# Patient Record
Sex: Male | Born: 1955
Health system: Southern US, Community
[De-identification: ages and names within clinical notes are randomized; demographics above are authoritative.]

## PROBLEM LIST (undated history)

## (undated) DIAGNOSIS — G629 Polyneuropathy, unspecified: Secondary | ICD-10-CM

## (undated) DIAGNOSIS — K219 Gastro-esophageal reflux disease without esophagitis: Secondary | ICD-10-CM

## (undated) DIAGNOSIS — I10 Essential (primary) hypertension: Secondary | ICD-10-CM

## (undated) DIAGNOSIS — I2510 Atherosclerotic heart disease of native coronary artery without angina pectoris: Secondary | ICD-10-CM

## (undated) DIAGNOSIS — I219 Acute myocardial infarction, unspecified: Secondary | ICD-10-CM

## (undated) DIAGNOSIS — E119 Type 2 diabetes mellitus without complications: Secondary | ICD-10-CM

## (undated) DIAGNOSIS — M1990 Unspecified osteoarthritis, unspecified site: Secondary | ICD-10-CM

## (undated) HISTORY — PX: VASECTOMY: SHX75

## (undated) HISTORY — PX: CATARACT EXTRACTION: SUR2

## (undated) HISTORY — PX: COLONOSCOPY W/ POLYPECTOMY: SHX1380

---

## 2015-11-23 DIAGNOSIS — E119 Type 2 diabetes mellitus without complications: Secondary | ICD-10-CM | POA: Diagnosis not present

## 2015-11-23 DIAGNOSIS — Z6826 Body mass index (BMI) 26.0-26.9, adult: Secondary | ICD-10-CM | POA: Diagnosis not present

## 2015-11-23 DIAGNOSIS — K219 Gastro-esophageal reflux disease without esophagitis: Secondary | ICD-10-CM | POA: Diagnosis not present

## 2015-11-23 DIAGNOSIS — E7800 Pure hypercholesterolemia, unspecified: Secondary | ICD-10-CM | POA: Diagnosis not present

## 2015-11-23 DIAGNOSIS — E1140 Type 2 diabetes mellitus with diabetic neuropathy, unspecified: Secondary | ICD-10-CM | POA: Diagnosis not present

## 2015-11-23 DIAGNOSIS — Z0000 Encounter for general adult medical examination without abnormal findings: Secondary | ICD-10-CM | POA: Diagnosis not present

## 2016-03-21 DIAGNOSIS — I10 Essential (primary) hypertension: Secondary | ICD-10-CM | POA: Diagnosis not present

## 2016-07-22 DIAGNOSIS — E1140 Type 2 diabetes mellitus with diabetic neuropathy, unspecified: Secondary | ICD-10-CM | POA: Diagnosis not present

## 2016-07-22 DIAGNOSIS — E119 Type 2 diabetes mellitus without complications: Secondary | ICD-10-CM | POA: Diagnosis not present

## 2016-07-22 DIAGNOSIS — I10 Essential (primary) hypertension: Secondary | ICD-10-CM | POA: Diagnosis not present

## 2016-07-22 DIAGNOSIS — K219 Gastro-esophageal reflux disease without esophagitis: Secondary | ICD-10-CM | POA: Diagnosis not present

## 2016-07-22 DIAGNOSIS — E7800 Pure hypercholesterolemia, unspecified: Secondary | ICD-10-CM | POA: Diagnosis not present

## 2016-10-05 DIAGNOSIS — M9902 Segmental and somatic dysfunction of thoracic region: Secondary | ICD-10-CM | POA: Diagnosis not present

## 2016-10-05 DIAGNOSIS — M9905 Segmental and somatic dysfunction of pelvic region: Secondary | ICD-10-CM | POA: Diagnosis not present

## 2016-10-05 DIAGNOSIS — M9903 Segmental and somatic dysfunction of lumbar region: Secondary | ICD-10-CM | POA: Diagnosis not present

## 2016-10-05 DIAGNOSIS — M545 Low back pain: Secondary | ICD-10-CM | POA: Diagnosis not present

## 2016-12-05 DIAGNOSIS — I10 Essential (primary) hypertension: Secondary | ICD-10-CM | POA: Diagnosis not present

## 2017-03-08 DIAGNOSIS — Z1389 Encounter for screening for other disorder: Secondary | ICD-10-CM | POA: Diagnosis not present

## 2017-03-08 DIAGNOSIS — I10 Essential (primary) hypertension: Secondary | ICD-10-CM | POA: Diagnosis not present

## 2017-03-08 DIAGNOSIS — R5382 Chronic fatigue, unspecified: Secondary | ICD-10-CM | POA: Diagnosis not present

## 2017-03-08 DIAGNOSIS — K219 Gastro-esophageal reflux disease without esophagitis: Secondary | ICD-10-CM | POA: Diagnosis not present

## 2017-03-08 DIAGNOSIS — E119 Type 2 diabetes mellitus without complications: Secondary | ICD-10-CM | POA: Diagnosis not present

## 2017-03-08 DIAGNOSIS — E7800 Pure hypercholesterolemia, unspecified: Secondary | ICD-10-CM | POA: Diagnosis not present

## 2017-03-08 DIAGNOSIS — E1140 Type 2 diabetes mellitus with diabetic neuropathy, unspecified: Secondary | ICD-10-CM | POA: Diagnosis not present

## 2017-04-17 DIAGNOSIS — E7800 Pure hypercholesterolemia, unspecified: Secondary | ICD-10-CM | POA: Diagnosis not present

## 2017-04-17 DIAGNOSIS — E1140 Type 2 diabetes mellitus with diabetic neuropathy, unspecified: Secondary | ICD-10-CM | POA: Diagnosis not present

## 2017-04-17 DIAGNOSIS — K219 Gastro-esophageal reflux disease without esophagitis: Secondary | ICD-10-CM | POA: Diagnosis not present

## 2017-04-17 DIAGNOSIS — E119 Type 2 diabetes mellitus without complications: Secondary | ICD-10-CM | POA: Diagnosis not present

## 2017-06-27 DIAGNOSIS — E7800 Pure hypercholesterolemia, unspecified: Secondary | ICD-10-CM | POA: Diagnosis not present

## 2017-06-27 DIAGNOSIS — K219 Gastro-esophageal reflux disease without esophagitis: Secondary | ICD-10-CM | POA: Diagnosis not present

## 2017-06-27 DIAGNOSIS — E119 Type 2 diabetes mellitus without complications: Secondary | ICD-10-CM | POA: Diagnosis not present

## 2017-06-27 DIAGNOSIS — E1140 Type 2 diabetes mellitus with diabetic neuropathy, unspecified: Secondary | ICD-10-CM | POA: Diagnosis not present

## 2017-06-27 DIAGNOSIS — I10 Essential (primary) hypertension: Secondary | ICD-10-CM | POA: Diagnosis not present

## 2017-07-31 DIAGNOSIS — E103293 Type 1 diabetes mellitus with mild nonproliferative diabetic retinopathy without macular edema, bilateral: Secondary | ICD-10-CM | POA: Diagnosis not present

## 2017-10-18 DIAGNOSIS — E7800 Pure hypercholesterolemia, unspecified: Secondary | ICD-10-CM | POA: Diagnosis not present

## 2017-10-18 DIAGNOSIS — E1140 Type 2 diabetes mellitus with diabetic neuropathy, unspecified: Secondary | ICD-10-CM | POA: Diagnosis not present

## 2017-10-18 DIAGNOSIS — S80869A Insect bite (nonvenomous), unspecified lower leg, initial encounter: Secondary | ICD-10-CM | POA: Diagnosis not present

## 2017-10-18 DIAGNOSIS — K219 Gastro-esophageal reflux disease without esophagitis: Secondary | ICD-10-CM | POA: Diagnosis not present

## 2017-10-20 DIAGNOSIS — K219 Gastro-esophageal reflux disease without esophagitis: Secondary | ICD-10-CM | POA: Diagnosis not present

## 2017-10-20 DIAGNOSIS — E7800 Pure hypercholesterolemia, unspecified: Secondary | ICD-10-CM | POA: Diagnosis not present

## 2017-10-20 DIAGNOSIS — A770 Spotted fever due to Rickettsia rickettsii: Secondary | ICD-10-CM | POA: Diagnosis not present

## 2017-10-20 DIAGNOSIS — E1140 Type 2 diabetes mellitus with diabetic neuropathy, unspecified: Secondary | ICD-10-CM | POA: Diagnosis not present

## 2017-10-30 ENCOUNTER — Encounter: Payer: Self-pay | Admitting: Cardiology

## 2017-10-30 DIAGNOSIS — K219 Gastro-esophageal reflux disease without esophagitis: Secondary | ICD-10-CM | POA: Diagnosis not present

## 2017-10-30 DIAGNOSIS — R9431 Abnormal electrocardiogram [ECG] [EKG]: Secondary | ICD-10-CM | POA: Diagnosis not present

## 2017-10-30 DIAGNOSIS — E7800 Pure hypercholesterolemia, unspecified: Secondary | ICD-10-CM | POA: Diagnosis not present

## 2017-10-30 DIAGNOSIS — E1140 Type 2 diabetes mellitus with diabetic neuropathy, unspecified: Secondary | ICD-10-CM | POA: Diagnosis not present

## 2017-10-30 DIAGNOSIS — E119 Type 2 diabetes mellitus without complications: Secondary | ICD-10-CM | POA: Diagnosis not present

## 2017-10-30 DIAGNOSIS — I10 Essential (primary) hypertension: Secondary | ICD-10-CM | POA: Diagnosis not present

## 2017-11-13 DIAGNOSIS — R9431 Abnormal electrocardiogram [ECG] [EKG]: Secondary | ICD-10-CM | POA: Diagnosis not present

## 2017-11-13 DIAGNOSIS — E1140 Type 2 diabetes mellitus with diabetic neuropathy, unspecified: Secondary | ICD-10-CM | POA: Diagnosis not present

## 2017-11-13 DIAGNOSIS — K219 Gastro-esophageal reflux disease without esophagitis: Secondary | ICD-10-CM | POA: Diagnosis not present

## 2017-11-13 DIAGNOSIS — E7800 Pure hypercholesterolemia, unspecified: Secondary | ICD-10-CM | POA: Diagnosis not present

## 2017-12-04 DIAGNOSIS — E7800 Pure hypercholesterolemia, unspecified: Secondary | ICD-10-CM | POA: Insufficient documentation

## 2017-12-04 DIAGNOSIS — R9431 Abnormal electrocardiogram [ECG] [EKG]: Secondary | ICD-10-CM

## 2017-12-04 DIAGNOSIS — K219 Gastro-esophageal reflux disease without esophagitis: Secondary | ICD-10-CM | POA: Insufficient documentation

## 2017-12-04 DIAGNOSIS — E1140 Type 2 diabetes mellitus with diabetic neuropathy, unspecified: Secondary | ICD-10-CM

## 2017-12-04 DIAGNOSIS — I10 Essential (primary) hypertension: Secondary | ICD-10-CM

## 2017-12-11 ENCOUNTER — Ambulatory Visit: Payer: BLUE CROSS/BLUE SHIELD | Admitting: Cardiology

## 2017-12-11 ENCOUNTER — Encounter: Payer: Self-pay | Admitting: Cardiology

## 2017-12-11 VITALS — BP 130/82 | HR 95 | Ht 74.0 in | Wt 188.0 lb

## 2017-12-11 DIAGNOSIS — R9431 Abnormal electrocardiogram [ECG] [EKG]: Secondary | ICD-10-CM

## 2017-12-11 DIAGNOSIS — E1140 Type 2 diabetes mellitus with diabetic neuropathy, unspecified: Secondary | ICD-10-CM

## 2017-12-11 DIAGNOSIS — I10 Essential (primary) hypertension: Secondary | ICD-10-CM

## 2017-12-11 DIAGNOSIS — I2510 Atherosclerotic heart disease of native coronary artery without angina pectoris: Secondary | ICD-10-CM | POA: Diagnosis not present

## 2017-12-11 MED ORDER — METOPROLOL TARTRATE 25 MG PO TABS: 25.0000 mg | ORAL_TABLET | Freq: Two times a day (BID) | ORAL | 2 refills | Status: DC

## 2017-12-11 MED ORDER — ATORVASTATIN CALCIUM 40 MG PO TABS: 40.0000 mg | ORAL_TABLET | Freq: Every day | ORAL | 2 refills | Status: DC

## 2017-12-11 NOTE — H&P (View-Only) (Signed)
He had a stress echo today which was grossly abnormal I went to the room to talk to him about this. I told him in his situation with some fixed defect involving inferior wall and grossly abnormal stress test cardiac catheterization should be considered. I discussed options with him that included simply medical therapy versus cardiac catheterization he decided to proceed with cardiac catheterization procedure was explained to him including all risk benefits as well as alternative. In the meantime I will give him ranolazine 500 twice daily plus additional nitroglycerin as needed. He is already on beta-blocker and aspirin which I will continue. Abnormal stress test: Disposition proceed with cardiac catheterization of the beginning of next week.?      Cardiology Consultation:    Date:  12/11/2017  Abnormal EKG ID:  [name], DOB 09/02/1955, MRN [id number]  PCP:  Lee, Keung, MD  Cardiologist:  Robert Krasowski, MD   Referring MD: Lee, Keung, MD   No chief complaint on file. Abnormal EKG  History of Present Illness:    [name] is a 62 y.o. male who is being seen today for the evaluation of abnormal EKG at the request of Lee, Keung, MD.  Recently he seen his primary care physician routinely yearly EKG was repeated and showed evidence of inferior wall myocardial infarction.  He denies having any symptoms there is no chest pain tightness squeezing pressure burning chest.  He does have multiple risk factor for coronary artery disease which include long-standing diabetes, hypertension.  He never smoked he does not have family history of premature coronary artery disease.  He is to be very athletic but now he cannot run mostly because of pain in his legs.  He thinks he does neuropathy.  He is somewhat surprised with this diagnosis and obviously very concerned.  History reviewed. No pertinent past medical history.  Past Surgical History:  Procedure Laterality Date  . CATARACT EXTRACTION    .  VASECTOMY      Current Medications: Current Meds  Medication Sig  . aspirin EC 81 MG tablet Take 81 mg by mouth daily.  . insulin detemir (LEVEMIR) 100 UNIT/ML injection Inject 25 Units into the skin at bedtime.  . metFORMIN (GLUCOPHAGE) 500 MG tablet Take 500 mg by mouth 2 (two) times daily with a meal.  . sitaGLIPtin (JANUVIA) 100 MG tablet Take 100 mg by mouth daily.     Allergies:   Codeine sulfate [codeine]   Social History   Socioeconomic History  . Marital status: Married    Spouse name: Not on file  . Number of children: Not on file  . Years of education: Not on file  . Highest education level: Not on file  Occupational History  . Not on file  Social Needs  . Financial resource strain: Not on file  . Food insecurity:    Worry: Not on file    Inability: Not on file  . Transportation needs:    Medical: Not on file    Non-medical: Not on file  Tobacco Use  . Smoking status: Never Smoker  . Smokeless tobacco: Never Used  Substance and Sexual Activity  . Alcohol use: Yes    Comment: occasional  . Drug use: Not on file  . Sexual activity: Not on file  Lifestyle  . Physical activity:    Days per week: Not on file    Minutes per session: Not on file  . Stress: Not on file  Relationships  . Social connections:      Talks on phone: Not on file    Gets together: Not on file    Attends religious service: Not on file    Active member of club or organization: Not on file    Attends meetings of clubs or organizations: Not on file    Relationship status: Not on file  Other Topics Concern  . Not on file  Social History Narrative  . Not on file     Family History: The patient's family history includes Diabetes in his daughter, father, and mother; Heart disease in his father. ROS:   Please see the history of present illness.    All 14 point review of systems negative except as described per history of present illness.  EKGs/Labs/Other Studies Reviewed:    The  following studies were reviewed today: Normal sinus rhythm normal P interval Q waves in inferior leads  EKG:  EKG is  ordered today.  The ekg ordered today demonstrates normal sinus rhythm normal PR interval Q waves in the inferior leads  Recent Labs: No results found for requested labs within last 8760 hours.  Recent Lipid Panel No results found for: CHOL, TRIG, HDL, CHOLHDL, VLDL, LDLCALC, LDLDIRECT  Physical Exam:    VS:  BP 130/82 (BP Location: Right Arm, Patient Position: Sitting, Cuff Size: Normal)   Pulse 95   Ht 6' 2" (1.88 m)   Wt 188 lb (85.3 kg)   SpO2 98%   BMI 24.14 kg/m     Wt Readings from Last 3 Encounters:  12/11/17 188 lb (85.3 kg)     GEN:  Well nourished, well developed in no acute distress HEENT: Normal NECK: No JVD; No carotid bruits LYMPHATICS: No lymphadenopathy CARDIAC: RRR, no murmurs, no rubs, no gallops RESPIRATORY:  Clear to auscultation without rales, wheezing or rhonchi  ABDOMEN: Soft, non-tender, non-distended MUSCULOSKELETAL:  No edema; No deformity  SKIN: Warm and dry NEUROLOGIC:  Alert and oriented x 3 PSYCHIATRIC:  Normal affect   ASSESSMENT:    1. Abnormal EKG   2. Coronary artery disease involving native coronary artery of native heart without angina pectoris   3. Essential hypertension   4. Controlled type 2 diabetes mellitus with neuropathy (HCC)    PLAN:    In order of problems listed above:  1. Abnormal EKG raising suspicion for inferior wall myocardialinfarction.  Obviously clarification of this problem need to happen.  I will schedule him to have echocardiogram to assess left ventricular ejection fraction with special attention being paid to his inferior wall.  On top of that he will have stress test done.  The purpose of it is to establish if he has still residual significant coronary artery disease.  In the meantime he is already on aspirin which I will continue he will be started on high intensity statin I will start him  today on metoprolol 25 twice daily.  In the future we will add ACE inhibitor or ARB.  Apparently he is on some research study for protein in his kidneys I suspect he is on either ACE inhibitor or ARB already need to find out more about exactly what kind of trial he is in. 2. Essential hypertension: Blood pressure well controlled continue present medications. 3. Type 2 diabetes: Followed by internal medicine team.  Apparently his hemoglobin A1c is still elevated in the neighborhood of 8.  The key will be to control his diabetes best possible.  Gentleman who was referred to us for EKG indicating possibility of inferior wall myocardial infarction.    Obviously that need to be clarified echocardiogram will be done as well as stress test.  Plus risk factors will be modified for his coronary artery disease.  We did talk in length about exercises on the regular basis and diet he understand this and he will try to comply with that.   Medication Adjustments/Labs and Tests Ordered: Current medicines are reviewed at length with the patient today.  Concerns regarding medicines are outlined above.  No orders of the defined types were placed in this encounter.  No orders of the defined types were placed in this encounter.   Signed, Robert J. Krasowski, MD, FACC. 12/11/2017 4:13 PM    [facility] 

## 2017-12-11 NOTE — Progress Notes (Addendum)
He had a stress echo today which was grossly abnormal I went to the room to talk to him about this. I told him in his situation with some fixed defect involving inferior wall and grossly abnormal stress test cardiac catheterization should be considered. I discussed options with him that included simply medical therapy versus cardiac catheterization he decided to proceed with cardiac catheterization procedure was explained to him including all risk benefits as well as alternative. In the meantime I will give him ranolazine 500 twice daily plus additional nitroglycerin as needed. He is already on beta-blocker and aspirin which I will continue. Abnormal stress test: Disposition proceed with cardiac catheterization of the beginning of next week.?      Cardiology Consultation:    Date:  12/11/2017  Abnormal EKG ID:  [name] Seiler, DOB 05/15/1956, MRN [id number]  PCP:  Simone CuriaLee, Keung, MD  Cardiologist:  Gypsy Balsamobert Idaliz Tinkle, MD   Referring MD: Simone CuriaLee, Keung, MD   No chief complaint on file. Abnormal EKG  History of Present Illness:    [name] Obriant is a 62 y.o. male who is being seen today for the evaluation of abnormal EKG at the request of Simone CuriaLee, Keung, MD.  Recently he seen his primary care physician routinely yearly EKG was repeated and showed evidence of inferior wall myocardial infarction.  He denies having any symptoms there is no chest pain tightness squeezing pressure burning chest.  He does have multiple risk factor for coronary artery disease which include long-standing diabetes, hypertension.  He never smoked he does not have family history of premature coronary artery disease.  He is to be very athletic but now he cannot run mostly because of pain in his legs.  He thinks he does neuropathy.  He is somewhat surprised with this diagnosis and obviously very concerned.  History reviewed. No pertinent past medical history.  Past Surgical History:  Procedure Laterality Date  . CATARACT EXTRACTION    .  VASECTOMY      Current Medications: Current Meds  Medication Sig  . aspirin EC 81 MG tablet Take 81 mg by mouth daily.  . insulin detemir (LEVEMIR) 100 UNIT/ML injection Inject 25 Units into the skin at bedtime.  . metFORMIN (GLUCOPHAGE) 500 MG tablet Take 500 mg by mouth 2 (two) times daily with a meal.  . sitaGLIPtin (JANUVIA) 100 MG tablet Take 100 mg by mouth daily.     Allergies:   Codeine sulfate [codeine]   Social History   Socioeconomic History  . Marital status: Married    Spouse name: Not on file  . Number of children: Not on file  . Years of education: Not on file  . Highest education level: Not on file  Occupational History  . Not on file  Social Needs  . Financial resource strain: Not on file  . Food insecurity:    Worry: Not on file    Inability: Not on file  . Transportation needs:    Medical: Not on file    Non-medical: Not on file  Tobacco Use  . Smoking status: Never Smoker  . Smokeless tobacco: Never Used  Substance and Sexual Activity  . Alcohol use: Yes    Comment: occasional  . Drug use: Not on file  . Sexual activity: Not on file  Lifestyle  . Physical activity:    Days per week: Not on file    Minutes per session: Not on file  . Stress: Not on file  Relationships  . Social connections:  Talks on phone: Not on file    Gets together: Not on file    Attends religious service: Not on file    Active member of club or organization: Not on file    Attends meetings of clubs or organizations: Not on file    Relationship status: Not on file  Other Topics Concern  . Not on file  Social History Narrative  . Not on file     Family History: The patient's family history includes Diabetes in his daughter, father, and mother; Heart disease in his father. ROS:   Please see the history of present illness.    All 14 point review of systems negative except as described per history of present illness.  EKGs/Labs/Other Studies Reviewed:    The  following studies were reviewed today: Normal sinus rhythm normal P interval Q waves in inferior leads  EKG:  EKG is  ordered today.  The ekg ordered today demonstrates normal sinus rhythm normal PR interval Q waves in the inferior leads  Recent Labs: No results found for requested labs within last 8760 hours.  Recent Lipid Panel No results found for: CHOL, TRIG, HDL, CHOLHDL, VLDL, LDLCALC, LDLDIRECT  Physical Exam:    VS:  BP 130/82 (BP Location: Right Arm, Patient Position: Sitting, Cuff Size: Normal)   Pulse 95   Ht 6\' 2"  (1.88 m)   Wt 188 lb (85.3 kg)   SpO2 98%   BMI 24.14 kg/m     Wt Readings from Last 3 Encounters:  12/11/17 188 lb (85.3 kg)     GEN:  Well nourished, well developed in no acute distress HEENT: Normal NECK: No JVD; No carotid bruits LYMPHATICS: No lymphadenopathy CARDIAC: RRR, no murmurs, no rubs, no gallops RESPIRATORY:  Clear to auscultation without rales, wheezing or rhonchi  ABDOMEN: Soft, non-tender, non-distended MUSCULOSKELETAL:  No edema; No deformity  SKIN: Warm and dry NEUROLOGIC:  Alert and oriented x 3 PSYCHIATRIC:  Normal affect   ASSESSMENT:    1. Abnormal EKG   2. Coronary artery disease involving native coronary artery of native heart without angina pectoris   3. Essential hypertension   4. Controlled type 2 diabetes mellitus with neuropathy (HCC)    PLAN:    In order of problems listed above:  1. Abnormal EKG raising suspicion for inferior wall myocardialinfarction.  Obviously clarification of this problem need to happen.  I will schedule him to have echocardiogram to assess left ventricular ejection fraction with special attention being paid to his inferior wall.  On top of that he will have stress test done.  The purpose of it is to establish if he has still residual significant coronary artery disease.  In the meantime he is already on aspirin which I will continue he will be started on high intensity statin I will start him  today on metoprolol 25 twice daily.  In the future we will add ACE inhibitor or ARB.  Apparently he is on some research study for protein in his kidneys I suspect he is on either ACE inhibitor or ARB already need to find out more about exactly what kind of trial he is in. 2. Essential hypertension: Blood pressure well controlled continue present medications. 3. Type 2 diabetes: Followed by internal medicine team.  Apparently his hemoglobin A1c is still elevated in the neighborhood of 8.  The key will be to control his diabetes best possible.  Gentleman who was referred to [place] for EKG indicating possibility of inferior wall myocardial infarction.  Obviously that need to be clarified echocardiogram will be done as well as stress test.  Plus risk factors will be modified for his coronary artery disease.  We did talk in length about exercises on the regular basis and diet he understand this and he will try to comply with that.   Medication Adjustments/Labs and Tests Ordered: Current medicines are reviewed at length with the patient today.  Concerns regarding medicines are outlined above.  No orders of the defined types were placed in this encounter.  No orders of the defined types were placed in this encounter.   Signed, Georgeanna Lea, MD, [facility]. 12/11/2017 4:13 PM    [facility]

## 2017-12-11 NOTE — Patient Instructions (Signed)
Medication Instructions:  Your physician has recommended you make the following change in your medication:  START metoprolol 25 mg twice daily START lipitor 40 mg daily  Labwork: None  Testing/Procedures: Your physician has requested that you have an echocardiogram. Echocardiography is a painless test that uses sound waves to create images of your heart. It provides your doctor with information about the size and shape of your heart and how well your heart's chambers and valves are working. This procedure takes approximately one hour. There are no restrictions for this procedure.  Your physician has requested that you have a stress echocardiogram. For further information please visit [email or web address]. Please follow instruction sheet as given.  Follow-Up: Your physician recommends that you schedule a follow-up appointment in: 1 month  Any Other Special Instructions Will Be Listed Below (If Applicable).     If you need a refill on your cardiac medications before your next appointment, please call your pharmacy.   [facility]  Garey HamAshley A, RN, BSN

## 2017-12-15 ENCOUNTER — Ambulatory Visit (INDEPENDENT_AMBULATORY_CARE_PROVIDER_SITE_OTHER): Payer: BLUE CROSS/BLUE SHIELD

## 2017-12-15 ENCOUNTER — Other Ambulatory Visit: Payer: Self-pay

## 2017-12-15 DIAGNOSIS — I2510 Atherosclerotic heart disease of native coronary artery without angina pectoris: Secondary | ICD-10-CM

## 2017-12-15 DIAGNOSIS — I10 Essential (primary) hypertension: Secondary | ICD-10-CM | POA: Diagnosis not present

## 2017-12-15 NOTE — Progress Notes (Addendum)
Stress test with echocardiogram has been performed.  Jimmy Shaney Deckman RDCS

## 2017-12-15 NOTE — Progress Notes (Unsigned)
He had a stress echo today which was grossly abnormal I went to the room to talk to him about this.  I told him in his situation with some fixed defect involving inferior wall and grossly abnormal stress test cardiac catheterization should be considered.  I discussed options with him that included simply medical therapy versus cardiac catheterization he decided to proceed with cardiac catheterization procedure was explained to him including all risk benefits as well as alternative.  In the meantime I will give him ranolazine 500 twice daily plus additional nitroglycerin as needed.  He is already on beta-blocker and aspirin which I will continue. Abnormal stress test: Disposition proceed with cardiac catheterization of the beginning of next week.

## 2017-12-16 LAB — CBC WITH DIFFERENTIAL/PLATELET
BASOS: 1 %
Basophils Absolute: 0 10*3/uL (ref 0.0–0.2)
EOS (ABSOLUTE): 0.4 10*3/uL (ref 0.0–0.4)
Eos: 5 %
Hematocrit: 42.3 % (ref 37.5–51.0)
Hemoglobin: 13.8 g/dL (ref 13.0–17.7)
Immature Grans (Abs): 0 10*3/uL (ref 0.0–0.1)
Immature Granulocytes: 0 %
Lymphocytes Absolute: 2 10*3/uL (ref 0.7–3.1)
Lymphs: 25 %
MCH: 29.6 pg (ref 26.6–33.0)
MCHC: 32.6 g/dL (ref 31.5–35.7)
MCV: 91 fL (ref 79–97)
MONOS ABS: 0.5 10*3/uL (ref 0.1–0.9)
Monocytes: 6 %
NEUTROS ABS: 5.2 10*3/uL (ref 1.4–7.0)
NEUTROS PCT: 63 %
Platelets: 248 10*3/uL (ref 150–450)
RBC: 4.67 x10E6/uL (ref 4.14–5.80)
RDW: 12.9 % (ref 12.3–15.4)
WBC: 8.1 10*3/uL (ref 3.4–10.8)

## 2017-12-16 LAB — BASIC METABOLIC PANEL
BUN / CREAT RATIO: 22 (ref 10–24)
BUN: 22 mg/dL (ref 8–27)
CHLORIDE: 102 mmol/L (ref 96–106)
CO2: 24 mmol/L (ref 20–29)
Calcium: 9.5 mg/dL (ref 8.6–10.2)
Creatinine, Ser: 1 mg/dL (ref 0.76–1.27)
GFR calc non Af Amer: 81 mL/min/{1.73_m2} (ref >59)
GFR, EST AFRICAN AMERICAN: 94 mL/min/{1.73_m2} (ref >59)
Glucose: 191 mg/dL — ABNORMAL HIGH (ref 65–99)
POTASSIUM: 4.6 mmol/L (ref 3.5–5.2)
Sodium: 141 mmol/L (ref 134–144)

## 2017-12-18 ENCOUNTER — Telehealth: Payer: Self-pay | Admitting: Cardiology

## 2017-12-18 NOTE — Telephone Encounter (Signed)
Clarified for the patient the diet allowed before the heart cath. Patient did not have any further questions or concerns.

## 2017-12-18 NOTE — Telephone Encounter (Signed)
New Message   Pt states he is having a Cath done tomorrow and he just wants to know more information about it. Please call

## 2017-12-19 ENCOUNTER — Encounter (INDEPENDENT_AMBULATORY_CARE_PROVIDER_SITE_OTHER): Payer: Self-pay

## 2017-12-19 ENCOUNTER — Encounter (HOSPITAL_COMMUNITY)
Admission: RE | Disposition: A | Discharge status: Home / Self Care | Payer: Self-pay | Source: Ambulatory Visit | Attending: Interventional Cardiology

## 2017-12-19 ENCOUNTER — Other Ambulatory Visit: Payer: Self-pay

## 2017-12-19 ENCOUNTER — Ambulatory Visit (HOSPITAL_COMMUNITY)
Admission: RE | Admit: 2017-12-19 | Discharge: 2017-12-19 | Disposition: A | Discharge status: Home / Self Care | Payer: BLUE CROSS/BLUE SHIELD | Source: Ambulatory Visit | Attending: Interventional Cardiology | Admitting: Interventional Cardiology

## 2017-12-19 DIAGNOSIS — I110 Hypertensive heart disease with heart failure: Secondary | ICD-10-CM | POA: Diagnosis not present

## 2017-12-19 DIAGNOSIS — I5042 Chronic combined systolic (congestive) and diastolic (congestive) heart failure: Secondary | ICD-10-CM | POA: Diagnosis not present

## 2017-12-19 DIAGNOSIS — I2582 Chronic total occlusion of coronary artery: Secondary | ICD-10-CM | POA: Diagnosis not present

## 2017-12-19 DIAGNOSIS — R9431 Abnormal electrocardiogram [ECG] [EKG]: Secondary | ICD-10-CM | POA: Diagnosis present

## 2017-12-19 DIAGNOSIS — E1140 Type 2 diabetes mellitus with diabetic neuropathy, unspecified: Secondary | ICD-10-CM | POA: Diagnosis present

## 2017-12-19 DIAGNOSIS — I2584 Coronary atherosclerosis due to calcified coronary lesion: Secondary | ICD-10-CM | POA: Diagnosis not present

## 2017-12-19 DIAGNOSIS — Z794 Long term (current) use of insulin: Secondary | ICD-10-CM | POA: Insufficient documentation

## 2017-12-19 DIAGNOSIS — I2510 Atherosclerotic heart disease of native coronary artery without angina pectoris: Secondary | ICD-10-CM | POA: Diagnosis not present

## 2017-12-19 DIAGNOSIS — Z885 Allergy status to narcotic agent status: Secondary | ICD-10-CM | POA: Diagnosis not present

## 2017-12-19 DIAGNOSIS — I10 Essential (primary) hypertension: Secondary | ICD-10-CM | POA: Diagnosis present

## 2017-12-19 DIAGNOSIS — Z7982 Long term (current) use of aspirin: Secondary | ICD-10-CM | POA: Insufficient documentation

## 2017-12-19 HISTORY — PX: LEFT HEART CATH AND CORONARY ANGIOGRAPHY: CATH118249

## 2017-12-19 HISTORY — PX: INTRAVASCULAR PRESSURE WIRE/FFR STUDY: CATH118243

## 2017-12-19 LAB — GLUCOSE, CAPILLARY
GLUCOSE-CAPILLARY: 110 mg/dL — AB (ref 70–99)
GLUCOSE-CAPILLARY: 84 mg/dL (ref 70–99)

## 2017-12-19 LAB — POCT ACTIVATED CLOTTING TIME: ACTIVATED CLOTTING TIME: 301 s

## 2017-12-19 SURGERY — LEFT HEART CATH AND CORONARY ANGIOGRAPHY
Anesthesia: LOCAL

## 2017-12-19 MED ORDER — MIDAZOLAM HCL 2 MG/2ML IJ SOLN
INTRAMUSCULAR | Status: AC
  Filled 2017-12-19: qty 2

## 2017-12-19 MED ORDER — ADENOSINE 12 MG/4ML IV SOLN
INTRAVENOUS | Status: AC
  Filled 2017-12-19: qty 16

## 2017-12-19 MED ORDER — HEPARIN (PORCINE) IN NACL 1000-0.9 UT/500ML-% IV SOLN
INTRAVENOUS | Status: DC | PRN
  Administered 2017-12-19: 1000 mL

## 2017-12-19 MED ORDER — HEPARIN SODIUM (PORCINE) 1000 UNIT/ML IJ SOLN
INTRAMUSCULAR | Status: AC
  Filled 2017-12-19: qty 1

## 2017-12-19 MED ORDER — IOHEXOL 350 MG/ML SOLN
INTRAVENOUS | Status: DC | PRN
  Administered 2017-12-19: 125 mL via INTRA_ARTERIAL

## 2017-12-19 MED ORDER — SODIUM CHLORIDE 0.9% FLUSH: 3.0000 mL | INTRAVENOUS | Status: DC | PRN

## 2017-12-19 MED ORDER — SODIUM CHLORIDE 0.9 % WEIGHT BASED INFUSION
3.0000 mL/kg/h | INTRAVENOUS | Status: AC
  Administered 2017-12-19: 3 mL/kg/h via INTRAVENOUS

## 2017-12-19 MED ORDER — SODIUM CHLORIDE 0.9% FLUSH: 3.0000 mL | Freq: Two times a day (BID) | INTRAVENOUS | Status: DC

## 2017-12-19 MED ORDER — VERAPAMIL HCL 2.5 MG/ML IV SOLN
INTRAVENOUS | Status: AC
  Filled 2017-12-19: qty 2

## 2017-12-19 MED ORDER — SODIUM CHLORIDE 0.9 % IV SOLN: INTRAVENOUS | Status: DC

## 2017-12-19 MED ORDER — ONDANSETRON HCL 4 MG/2ML IJ SOLN: 4.0000 mg | Freq: Four times a day (QID) | INTRAMUSCULAR | Status: DC | PRN

## 2017-12-19 MED ORDER — HEPARIN SODIUM (PORCINE) 1000 UNIT/ML IJ SOLN
INTRAMUSCULAR | Status: DC | PRN
  Administered 2017-12-19: 4000 [IU] via INTRAVENOUS
  Administered 2017-12-19: 5000 [IU] via INTRAVENOUS

## 2017-12-19 MED ORDER — FENTANYL CITRATE (PF) 100 MCG/2ML IJ SOLN
INTRAMUSCULAR | Status: AC
  Filled 2017-12-19: qty 2

## 2017-12-19 MED ORDER — ACETAMINOPHEN 325 MG PO TABS: 650.0000 mg | ORAL_TABLET | ORAL | Status: DC | PRN

## 2017-12-19 MED ORDER — FENTANYL CITRATE (PF) 100 MCG/2ML IJ SOLN
INTRAMUSCULAR | Status: DC | PRN
  Administered 2017-12-19: 50 ug via INTRAVENOUS

## 2017-12-19 MED ORDER — MIDAZOLAM HCL 2 MG/2ML IJ SOLN
INTRAMUSCULAR | Status: DC | PRN
  Administered 2017-12-19 (×3): 1 mg via INTRAVENOUS

## 2017-12-19 MED ORDER — VERAPAMIL HCL 2.5 MG/ML IV SOLN
INTRAVENOUS | Status: DC | PRN
  Administered 2017-12-19: 10 mL via INTRA_ARTERIAL

## 2017-12-19 MED ORDER — SODIUM CHLORIDE 0.9 % IV SOLN
250.0000 mL | INTRAVENOUS | Status: DC | PRN
  Administered 2017-12-19: 250 mL via INTRAVENOUS

## 2017-12-19 MED ORDER — NITROGLYCERIN 1 MG/10 ML FOR IR/CATH LAB
INTRA_ARTERIAL | Status: DC | PRN
  Administered 2017-12-19: 200 ug via INTRACORONARY

## 2017-12-19 MED ORDER — HEPARIN (PORCINE) IN NACL 1000-0.9 UT/500ML-% IV SOLN
INTRAVENOUS | Status: AC
  Filled 2017-12-19: qty 1000

## 2017-12-19 MED ORDER — LIDOCAINE HCL (PF) 1 % IJ SOLN
INTRAMUSCULAR | Status: DC | PRN
  Administered 2017-12-19: 2 mL

## 2017-12-19 MED ORDER — LIDOCAINE HCL (PF) 1 % IJ SOLN
INTRAMUSCULAR | Status: AC
  Filled 2017-12-19: qty 30

## 2017-12-19 MED ORDER — SODIUM CHLORIDE 0.9 % IV SOLN: 250.0000 mL | INTRAVENOUS | Status: DC | PRN

## 2017-12-19 MED ORDER — SODIUM CHLORIDE 0.9 % WEIGHT BASED INFUSION: 1.0000 mL/kg/h | INTRAVENOUS | Status: DC

## 2017-12-19 MED ORDER — ASPIRIN 81 MG PO CHEW: 81.0000 mg | CHEWABLE_TABLET | ORAL | Status: DC

## 2017-12-19 SURGICAL SUPPLY — 14 items
CATH 5FR JL3.5 JR4 ANG PIG MP (CATHETERS) ×2 IMPLANT
CATH LAUNCHER 5F EBU3.5 (CATHETERS) ×2 IMPLANT
COVER PRB 48X5XTLSCP FOLD TPE (BAG) ×1 IMPLANT
COVER PROBE 5X48 (BAG) ×1
DEVICE RAD COMP TR BAND LRG (VASCULAR PRODUCTS) ×2 IMPLANT
GLIDESHEATH SLEND A-KIT 6F 22G (SHEATH) ×2 IMPLANT
GUIDEWIRE INQWIRE 1.5J.035X260 (WIRE) ×1 IMPLANT
GUIDEWIRE PRESSURE COMET II (WIRE) ×2 IMPLANT
INQWIRE 1.5J .035X260CM (WIRE) ×2
KIT ESSENTIALS PG (KITS) ×2 IMPLANT
KIT HEART LEFT (KITS) ×2 IMPLANT
PACK CARDIAC CATHETERIZATION (CUSTOM PROCEDURE TRAY) ×2 IMPLANT
TRANSDUCER W/STOPCOCK (MISCELLANEOUS) ×2 IMPLANT
TUBING CIL FLEX 10 FLL-RA (TUBING) ×2 IMPLANT

## 2017-12-19 NOTE — Interval H&P Note (Signed)
Cath Lab Visit (complete for each Cath Lab visit)  Clinical Evaluation Leading to the Procedure:   ACS: No.  Non-ACS:    Anginal Classification: CCS III  Anti-ischemic medical therapy: Minimal Therapy (1 class of medications)  Non-Invasive Test Results: Intermediate-risk stress test findings: cardiac mortality 1-3%/year  Prior CABG: No previous CABG      History and Physical Interval Note:  12/19/2017 1:50 PM  Katherina MiresMilton A Bonaventura  has presented today for surgery, with the diagnosis of abnormal stress echo  The various methods of treatment have been discussed with the patient and family. After consideration of risks, benefits and other options for treatment, the patient has consented to  Procedure(s): LEFT HEART CATH AND CORONARY ANGIOGRAPHY (N/A) as a surgical intervention .  The patient's history has been reviewed, patient examined, no change in status, stable for surgery.  I have reviewed the patient's chart and labs.  Questions were answered to the patient's satisfaction.     Lyn RecordsHenry W Smith III

## 2017-12-19 NOTE — Progress Notes (Signed)
**Note Dorsel Flinn-Identified via Obfuscation** Patient discharged home alert and oriented x4. Denies any chest pain, dizziness, or shortness of breath. Radial site level 0 and dressing clean, dry, and intact. Patient escorted to main entrance in wheelchair and daughter present for transport home. Fall/safety precautions implemented.

## 2017-12-19 NOTE — Discharge Instructions (Signed)
Drink plenty of fluids over next 48 hours and keep right wrist elevated at heart level for 24 hours ° °Radial Site Care °Refer to this sheet in the next few weeks. These instructions provide you with information about caring for yourself after your procedure. Your health care provider may also give you more specific instructions. Your treatment has been planned according to current medical practices, but problems sometimes occur. Call your health care provider if you have any problems or questions after your procedure. °What can I expect after the procedure? °After your procedure, it is typical to have the following: °· Bruising at the radial site that usually fades within 1-2 weeks. °· Blood collecting in the tissue (hematoma) that may be painful to the touch. It should usually decrease in size and tenderness within 1-2 weeks. ° °Follow these instructions at home: °· Take medicines only as directed by your health care provider. °· You may shower 24-48 hours after the procedure or as directed by your health care provider. Remove the bandage (dressing) and gently wash the site with plain soap and water. Pat the area dry with a clean towel. Do not rub the site, because this may cause bleeding. °· Do not take baths, swim, or use a hot tub until your health care provider approves. °· Check your insertion site every day for redness, swelling, or drainage. °· Do not apply powder or lotion to the site. °· Do not flex or bend the affected arm for 24 hours or as directed by your health care provider. °· Do not push or pull heavy objects with the affected arm for 24 hours or as directed by your health care provider. °· Do not lift over 10 lb (4.5 kg) for 5 days after your procedure or as directed by your health care provider. °· Ask your health care provider when it is okay to: °? Return to work or school. °? Resume usual physical activities or sports. °? Resume sexual activity. °· Do not drive home if you are discharged the  same day as the procedure. Have someone else drive you. °· You may drive 24 hours after the procedure unless otherwise instructed by your health care provider. °· Do not operate machinery or power tools for 24 hours after the procedure. °· If your procedure was done as an outpatient procedure, which means that you went home the same day as your procedure, a responsible adult should be with you for the first 24 hours after you arrive home. °· Keep all follow-up visits as directed by your health care provider. This is important. °Contact a health care provider if: °· You have a fever. °· You have chills. °· You have increased bleeding from the radial site. Hold pressure on the site. °Get help right away if: °· You have unusual pain at the radial site. °· You have redness, warmth, or swelling at the radial site. °· You have drainage (other than a small amount of blood on the dressing) from the radial site. °· The radial site is bleeding, and the bleeding does not stop after 30 minutes of holding steady pressure on the site. °· Your arm or hand becomes pale, cool, tingly, or numb. °This information is not intended to replace advice given to you by your health care provider. Make sure you discuss any questions you have with your health care provider. °Document Released: 07/02/2010 Document Revised: 11/05/2015 Document Reviewed: 12/16/2013 °Elsevier Interactive Patient Education © 2018 [employer] ° °

## 2017-12-20 ENCOUNTER — Encounter (HOSPITAL_COMMUNITY): Payer: Self-pay | Admitting: Interventional Cardiology

## 2017-12-20 ENCOUNTER — Telehealth: Payer: Self-pay | Admitting: Cardiology

## 2017-12-20 NOTE — Telephone Encounter (Signed)
Please call patient regarding his heart cath results, he just wants to follow up with us. He is going to see a Careers advisersurgeon for results..Marland Kitchen

## 2017-12-21 NOTE — Telephone Encounter (Signed)
Cath was reviewed by Dr. Bing MatterKrasowski today, left voicemail for the patient to call the office.

## 2017-12-22 ENCOUNTER — Encounter: Payer: Self-pay | Admitting: Cardiothoracic Surgery

## 2017-12-22 ENCOUNTER — Other Ambulatory Visit: Payer: Self-pay | Admitting: *Deleted

## 2017-12-22 ENCOUNTER — Institutional Professional Consult (permissible substitution): Payer: BLUE CROSS/BLUE SHIELD | Admitting: Cardiothoracic Surgery

## 2017-12-22 VITALS — BP 140/95 | HR 100 | Resp 20 | Ht 74.0 in | Wt 188.0 lb

## 2017-12-22 DIAGNOSIS — I2510 Atherosclerotic heart disease of native coronary artery without angina pectoris: Secondary | ICD-10-CM | POA: Diagnosis not present

## 2017-12-22 DIAGNOSIS — E1159 Type 2 diabetes mellitus with other circulatory complications: Secondary | ICD-10-CM | POA: Diagnosis not present

## 2017-12-22 DIAGNOSIS — Z794 Long term (current) use of insulin: Secondary | ICD-10-CM | POA: Diagnosis not present

## 2017-12-22 NOTE — Progress Notes (Signed)
Molly Maduroobert, Dr. Donata ClayVan Trigt plans CAB surgery on your patient this month. Please see note.

## 2017-12-22 NOTE — Progress Notes (Signed)
PCP is Simone CuriaLee, Keung, MD Referring Provider is Lyn RecordsSmith, Henry W, MD  Chief Complaint  Patient presents with  . Coronary Artery Disease    Surgical eval, Cardiac Cath 12/19/17, ECHO 12/11/17, Stress Test 12/15/17    HPI: Patient examined, coronary arteriogram images personally reviewed and counseled with patient.  62 year old insulin-dependent non-smoker with positive family history of CAD-CABG presents for discussion of surgical coronary revascularization.  The patient was noted to have EKG changes on a annual physical.  He underwent a stress test which was abnormal.  He then underwent left heart cath earlier this week by Dr. Katrinka BlazingSmith which demonstrated chronic occlusion of the RCA, high-grade stenosis of the circumflex and moderate disease of the LAD.  Ejection fraction approximately 50%.  Because of his diabetes, three-vessel disease, and the occurrence of an asymptomatic MI he was felt to be a candidate for surgical coronary rationalization.  Patient was prescribed metoprolol 25 twice daily and Lipitor 40 mg by Dr. Katrinka BlazingSmith.  He also takes aspirin Januvia and previously took metformin.  Patient has history of varicose veins but no DVT.  No history of TIA.  No history of claudication.  Patient has neuropathy of his legs from diabetes but no retinopathy or  or nephropathy but he states he does have some protein in his urine.  Patient has had previous cataract surgery and vasa ectomy without problems with bleeding or anesthesia.  History reviewed. No pertinent past medical history.  Past Surgical History:  Procedure Laterality Date  . CATARACT EXTRACTION    . INTRAVASCULAR PRESSURE WIRE/FFR STUDY N/A 12/19/2017   Procedure: INTRAVASCULAR PRESSURE WIRE/FFR STUDY;  Surgeon: Lyn RecordsSmith, Henry W, MD;  [facility];  Service: Cardiovascular;  Laterality: N/A;  . LEFT HEART CATH AND CORONARY ANGIOGRAPHY N/A 12/19/2017   Procedure: LEFT HEART CATH AND CORONARY ANGIOGRAPHY;  Surgeon: Lyn RecordsSmith, Henry W, MD;   Location: [facility];  Service: Cardiovascular;  Laterality: N/A;  . VASECTOMY      Family History  Problem Relation Age of Onset  . Diabetes Mother   . Diabetes Father   . Heart disease Father   . Diabetes Daughter     Social History Social History   Tobacco Use  . Smoking status: Never Smoker  . Smokeless tobacco: Never Used  Substance Use Topics  . Alcohol use: Yes    Comment: occasional  . Drug use: Not on file    Current Outpatient Medications  Medication Sig Dispense Refill  . aspirin EC 81 MG tablet Take 81 mg by mouth daily.    . insulin detemir (LEVEMIR) 100 UNIT/ML injection Inject 25 Units into the skin at bedtime.    . metFORMIN (GLUCOPHAGE) 500 MG tablet Take 500 mg by mouth 2 (two) times daily with a meal.    . sitaGLIPtin (JANUVIA) 100 MG tablet Take 100 mg by mouth every morning.     Marland Kitchen. atorvastatin (LIPITOR) 40 MG tablet Take 1 tablet (40 mg total) by mouth daily at 6 PM. 90 tablet 2  . metoprolol tartrate (LOPRESSOR) 25 MG tablet Take 1 tablet (25 mg total) by mouth 2 (two) times daily. 180 tablet 2   No current facility-administered medications for this visit.     Allergies  Allergen Reactions  . Codeine Sulfate [Codeine]     Review of Systems       Right-hand-dominant No thoracic injuries or procedures, no pneumothorax Positive for GERD symptoms, previous normal colonoscopy last year, varicose veins both legs left greater than  right             Review of Systems :  [ y ] = yes, [  ] = no        General :  Weight gain [   ]    Weight loss  [4 pounds]  Fatigue [yes]  Fever [  ]  Chills  [  ]                                          HEENT    Headache [  ]  Dizziness [  ]  Blurred vision [  ] Glaucoma  [  ]                          Nosebleeds [  ] Painful or loose teeth [  ]        Cardiac :  Chest pain/ pressure [  ]  Resting SOB [  ] exertional SOB [  ]                        Orthopnea [  ]  Pedal edema  [  ]  Palpitations [  ]  Syncope/presyncope [ ]                         Paroxysmal nocturnal dyspnea [  ]         Pulmonary : cough [  ]  wheezing [  ]  Hemoptysis [  ] Sputum [  ] Snoring [  ]                              Pneumothorax [  ]  Sleep apnea [  ]        GI : Vomiting [  ]  Dysphagia [  ]  Melena  [  ]  Abdominal pain [  ] BRBPR [  ]              Heart burn [  ]  Constipation [  ] Diarrhea  [  ] Colonoscopy [   ]        GU : Hematuria [  ]  Dysuria [  ]  Nocturia [  ] UTI's [  ]        Vascular : Claudication [  ]  Rest pain [  ]  DVT [  ] Vein stripping [  ] leg ulcers [  ]                          TIA [  ] Stroke [  ]  Varicose veins [  ]        NEURO :  Headaches  [  ] Seizures [  ] Vision changes [  ] Paresthesias [  ]                                               Musculoskeletal :  Arthritis [  ] Gout  [  ]  Back pain [  ]  Joint pain [  ]  Skin :  Rash [  ]  Melanoma [  ] Sores [  ]        Heme : Bleeding problems [  ]Clotting Disorders [  ] Anemia [  ]Blood Transfusion [ ]         Endocrine : Diabetes [  ] Heat or Cold intolerance [  ] Polyuria [  ]excessive thirst [ ]         Psych : Depression [  ]  Anxiety [  ]  Psych hospitalizations [  ] Memory change [  ]                                                                            BP (!) 140/95   Pulse 100   Resp 20   Ht 6\' 2"  (1.88 m)   Wt 188 lb (85.3 kg)   SpO2 98% Comment: RA  BMI 24.14 kg/m  Physical Exam     Physical Exam  General: Well-developed middle-aged white male no acute distress accompanied by wife HEENT: Normocephalic pupils equal , dentition adequate Neck: Supple without JVD, adenopathy, or bruit Chest: Clear to auscultation, symmetrical breath sounds, no rhonchi, no tenderness             or deformity Cardiovascular: Regular rate and rhythm, no murmur, no gallop, peripheral pulses             palpable in all extremities Abdomen:  Soft, nontender, no palpable mass or organomegaly Extremities:  Warm, well-perfused, no clubbing cyanosis edema or tenderness, bilateral superficial varicose veins with a small .skin ulcer on the medial aspect of his lower left leg              Rectal/GU: Deferred Neuro: Grossly non--focal and symmetrical throughout Skin: Clean and dry without rash, small ulcer on left lower leg   Diagnostic Tests: Coronary urogram show chronic occlusion of the RCA with reconstitution of the posterior descending via collaterals, high-grade greater than 80% stenosis of the large circumflex marginal and 75% stenosis of the LAD.  EF 50 to 55%.  Impression: Significant three-vessel diabetic disease, previous asymptomatic MI, positive stress test.  Patient would benefit from surgical CABG. He understands his benefits to include improved expected survival, reduce risk of recurrent MI, and preservation of LV function. Plan: Patient be prepared for surgery on Tuesday, July 16 at [facility].  He will obtain vein mapping.  We will be.  Prepared to harvest left radial artery if vein conduit is not adequate.  I discussed the procedure in detail with the patient is wife including indication benefits and alternatives.  He understands the risks of stroke, bleeding, blood transfusion, infection, organ failure, and death.  He agrees to proceed with surgery.   Mikey Bussing, MD [facility] [phone]

## 2017-12-25 ENCOUNTER — Ambulatory Visit (HOSPITAL_BASED_OUTPATIENT_CLINIC_OR_DEPARTMENT_OTHER)
Admission: RE | Admit: 2017-12-25 | Discharge: 2017-12-25 | Disposition: A | Discharge status: Home / Self Care | Payer: BLUE CROSS/BLUE SHIELD | Source: Ambulatory Visit | Attending: Cardiothoracic Surgery | Admitting: Cardiothoracic Surgery

## 2017-12-25 ENCOUNTER — Ambulatory Visit (HOSPITAL_COMMUNITY)
Admission: RE | Admit: 2017-12-25 | Discharge: 2017-12-25 | Disposition: A | Discharge status: Home / Self Care | Payer: BLUE CROSS/BLUE SHIELD | Source: Ambulatory Visit | Attending: Cardiothoracic Surgery | Admitting: Cardiothoracic Surgery

## 2017-12-25 ENCOUNTER — Encounter (HOSPITAL_COMMUNITY)
Admission: RE | Admit: 2017-12-25 | Discharge: 2017-12-25 | Disposition: A | Discharge status: Home / Self Care | Payer: BLUE CROSS/BLUE SHIELD | Source: Ambulatory Visit | Attending: Cardiothoracic Surgery | Admitting: Cardiothoracic Surgery

## 2017-12-25 ENCOUNTER — Encounter (HOSPITAL_COMMUNITY): Payer: Self-pay

## 2017-12-25 ENCOUNTER — Other Ambulatory Visit: Payer: Self-pay

## 2017-12-25 DIAGNOSIS — I340 Nonrheumatic mitral (valve) insufficiency: Secondary | ICD-10-CM | POA: Diagnosis not present

## 2017-12-25 DIAGNOSIS — R0902 Hypoxemia: Secondary | ICD-10-CM | POA: Diagnosis not present

## 2017-12-25 DIAGNOSIS — I2510 Atherosclerotic heart disease of native coronary artery without angina pectoris: Secondary | ICD-10-CM

## 2017-12-25 DIAGNOSIS — E1140 Type 2 diabetes mellitus with diabetic neuropathy, unspecified: Secondary | ICD-10-CM | POA: Diagnosis not present

## 2017-12-25 DIAGNOSIS — Z9861 Coronary angioplasty status: Secondary | ICD-10-CM

## 2017-12-25 DIAGNOSIS — I8390 Asymptomatic varicose veins of unspecified lower extremity: Secondary | ICD-10-CM | POA: Diagnosis not present

## 2017-12-25 DIAGNOSIS — Z9889 Other specified postprocedural states: Secondary | ICD-10-CM

## 2017-12-25 DIAGNOSIS — Z951 Presence of aortocoronary bypass graft: Secondary | ICD-10-CM | POA: Diagnosis not present

## 2017-12-25 DIAGNOSIS — Z01818 Encounter for other preprocedural examination: Secondary | ICD-10-CM

## 2017-12-25 DIAGNOSIS — Z7982 Long term (current) use of aspirin: Secondary | ICD-10-CM | POA: Diagnosis not present

## 2017-12-25 DIAGNOSIS — E8770 Fluid overload, unspecified: Secondary | ICD-10-CM | POA: Diagnosis not present

## 2017-12-25 DIAGNOSIS — Z01812 Encounter for preprocedural laboratory examination: Secondary | ICD-10-CM | POA: Insufficient documentation

## 2017-12-25 DIAGNOSIS — M1990 Unspecified osteoarthritis, unspecified site: Secondary | ICD-10-CM | POA: Diagnosis not present

## 2017-12-25 DIAGNOSIS — I252 Old myocardial infarction: Secondary | ICD-10-CM | POA: Diagnosis not present

## 2017-12-25 DIAGNOSIS — I5181 Takotsubo syndrome: Secondary | ICD-10-CM | POA: Diagnosis not present

## 2017-12-25 DIAGNOSIS — Z794 Long term (current) use of insulin: Secondary | ICD-10-CM

## 2017-12-25 DIAGNOSIS — E7800 Pure hypercholesterolemia, unspecified: Secondary | ICD-10-CM | POA: Diagnosis not present

## 2017-12-25 DIAGNOSIS — I10 Essential (primary) hypertension: Secondary | ICD-10-CM | POA: Diagnosis not present

## 2017-12-25 DIAGNOSIS — Z833 Family history of diabetes mellitus: Secondary | ICD-10-CM | POA: Diagnosis not present

## 2017-12-25 DIAGNOSIS — K219 Gastro-esophageal reflux disease without esophagitis: Secondary | ICD-10-CM | POA: Diagnosis not present

## 2017-12-25 DIAGNOSIS — Z79899 Other long term (current) drug therapy: Secondary | ICD-10-CM | POA: Insufficient documentation

## 2017-12-25 DIAGNOSIS — Z885 Allergy status to narcotic agent status: Secondary | ICD-10-CM | POA: Diagnosis not present

## 2017-12-25 DIAGNOSIS — J9811 Atelectasis: Secondary | ICD-10-CM | POA: Diagnosis not present

## 2017-12-25 DIAGNOSIS — Z86010 Personal history of colonic polyps: Secondary | ICD-10-CM | POA: Diagnosis not present

## 2017-12-25 HISTORY — DX: Unspecified osteoarthritis, unspecified site: M19.90

## 2017-12-25 HISTORY — DX: Acute myocardial infarction, unspecified: I21.9

## 2017-12-25 HISTORY — DX: Atherosclerotic heart disease of native coronary artery without angina pectoris: I25.10

## 2017-12-25 HISTORY — DX: Gastro-esophageal reflux disease without esophagitis: K21.9

## 2017-12-25 HISTORY — DX: Polyneuropathy, unspecified: G62.9

## 2017-12-25 HISTORY — DX: Type 2 diabetes mellitus without complications: E11.9

## 2017-12-25 LAB — PULMONARY FUNCTION TEST
DL/VA % pred: 70 %
DL/VA: 3.4 ml/min/mmHg/L
DLCO cor % pred: 67 %
DLCO cor: 25.46 ml/min/mmHg
DLCO unc % pred: 66 %
DLCO unc: 24.95 ml/min/mmHg
FEF 25-75 Post: 4.05 L/sec
FEF 25-75 Pre: 2.93 L/sec
FEF2575-%Change-Post: 38 %
FEF2575-%Pred-Post: 121 %
FEF2575-%Pred-Pre: 87 %
FEV1-%Change-Post: 9 %
FEV1-%Pred-Post: 103 %
FEV1-%Pred-Pre: 94 %
FEV1-Post: 4.26 L
FEV1-Pre: 3.89 L
FEV1FVC-%Change-Post: 5 %
FEV1FVC-%Pred-Pre: 96 %
FEV6-%Change-Post: 5 %
FEV6-%Pred-Post: 105 %
FEV6-%Pred-Pre: 99 %
FEV6-Post: 5.49 L
FEV6-Pre: 5.21 L
FEV6FVC-%Change-Post: 1 %
FEV6FVC-%Pred-Post: 103 %
FEV6FVC-%Pred-Pre: 101 %
FVC-%Change-Post: 4 %
FVC-%Pred-Post: 101 %
FVC-%Pred-Pre: 97 %
FVC-Post: 5.54 L
FVC-Pre: 5.32 L
Post FEV1/FVC ratio: 77 %
Post FEV6/FVC ratio: 99 %
Pre FEV1/FVC ratio: 73 %
Pre FEV6/FVC Ratio: 98 %
RV % pred: 115 %
RV: 2.86 L
TLC % pred: 106 %
TLC: 8.28 L

## 2017-12-25 LAB — PROTIME-INR
INR: 1
Prothrombin Time: 13.1 seconds (ref 11.4–15.2)

## 2017-12-25 LAB — CBC
HCT: 43.7 % (ref 39.0–52.0)
Hemoglobin: 13.9 g/dL (ref 13.0–17.0)
MCH: 30.1 pg (ref 26.0–34.0)
MCHC: 31.8 g/dL (ref 30.0–36.0)
MCV: 94.6 fL (ref 78.0–100.0)
Platelets: 222 10*3/uL (ref 150–400)
RBC: 4.62 MIL/uL (ref 4.22–5.81)
RDW: 13.1 % (ref 11.5–15.5)
WBC: 7.2 10*3/uL (ref 4.0–10.5)

## 2017-12-25 LAB — BLOOD GAS, ARTERIAL
Acid-base deficit: 0.1 mmol/L (ref 0.0–2.0)
Bicarbonate: 23.7 mmol/L (ref 20.0–28.0)
Drawn by: 20517
O2 Saturation: 98.2 %
Patient temperature: 98.6
pCO2 arterial: 36.2 mmHg (ref 32.0–48.0)
pH, Arterial: 7.431 (ref 7.350–7.450)
pO2, Arterial: 106 mmHg (ref 83.0–108.0)

## 2017-12-25 LAB — COMPREHENSIVE METABOLIC PANEL
ALT: 21 U/L (ref 0–44)
AST: 17 U/L (ref 15–41)
Albumin: 3.5 g/dL (ref 3.5–5.0)
Alkaline Phosphatase: 72 U/L (ref 38–126)
Anion gap: 5 (ref 5–15)
BUN: 14 mg/dL (ref 6–20)
CO2: 29 mmol/L (ref 22–32)
Calcium: 9.1 mg/dL (ref 8.9–10.3)
Chloride: 106 mmol/L (ref 98–111)
Creatinine, Ser: 0.96 mg/dL (ref 0.61–1.24)
GFR calc Af Amer: >60 mL/min (ref >60)
GFR calc non Af Amer: >60 mL/min (ref >60)
Glucose, Bld: 130 mg/dL — ABNORMAL HIGH (ref 70–99)
Potassium: 4.7 mmol/L (ref 3.5–5.1)
Sodium: 140 mmol/L (ref 135–145)
Total Bilirubin: 0.6 mg/dL (ref 0.3–1.2)
Total Protein: 6.4 g/dL — ABNORMAL LOW (ref 6.5–8.1)

## 2017-12-25 LAB — URINALYSIS, ROUTINE W REFLEX MICROSCOPIC
Bacteria, UA: NONE SEEN
Bilirubin Urine: NEGATIVE
Glucose, UA: NEGATIVE mg/dL
Ketones, ur: NEGATIVE mg/dL
Leukocytes, UA: NEGATIVE
Nitrite: NEGATIVE
Protein, ur: 30 mg/dL — AB
Specific Gravity, Urine: 1.018 (ref 1.005–1.030)
pH: 6 (ref 5.0–8.0)

## 2017-12-25 LAB — SURGICAL PCR SCREEN
MRSA, PCR: NEGATIVE
Staphylococcus aureus: NEGATIVE

## 2017-12-25 LAB — ABO/RH: ABO/RH(D): A POS

## 2017-12-25 LAB — HEMOGLOBIN A1C
Hgb A1c MFr Bld: 7.7 % — ABNORMAL HIGH (ref 4.8–5.6)
Mean Plasma Glucose: 174.29 mg/dL

## 2017-12-25 LAB — GLUCOSE, CAPILLARY: Glucose-Capillary: 135 mg/dL — ABNORMAL HIGH (ref 70–99)

## 2017-12-25 LAB — APTT: aPTT: 31 seconds (ref 24–36)

## 2017-12-25 MED ORDER — POTASSIUM CHLORIDE 2 MEQ/ML IV SOLN
80.0000 meq | INTRAVENOUS | Status: DC
  Filled 2017-12-25: qty 40

## 2017-12-25 MED ORDER — EPINEPHRINE PF 1 MG/ML IJ SOLN
0.0000 ug/min | INTRAMUSCULAR | Status: DC
  Filled 2017-12-25: qty 4

## 2017-12-25 MED ORDER — PLASMA-LYTE 148 IV SOLN
INTRAVENOUS | Status: AC
  Administered 2017-12-26: 500 mL
  Filled 2017-12-25: qty 2.5

## 2017-12-25 MED ORDER — SODIUM CHLORIDE 0.9 % IV SOLN
INTRAVENOUS | Status: AC
  Administered 2017-12-26: 1 [IU]/h via INTRAVENOUS
  Filled 2017-12-25: qty 1

## 2017-12-25 MED ORDER — SODIUM CHLORIDE 0.9 % IV SOLN
30.0000 ug/min | INTRAVENOUS | Status: AC
  Administered 2017-12-26: 15 ug/min via INTRAVENOUS
  Filled 2017-12-25: qty 2

## 2017-12-25 MED ORDER — CEFUROXIME SODIUM 1.5 G IV SOLR
1.5000 g | INTRAVENOUS | Status: AC
  Administered 2017-12-26: 1.5 g via INTRAVENOUS
  Filled 2017-12-25: qty 1.5

## 2017-12-25 MED ORDER — DEXMEDETOMIDINE HCL IN NACL 400 MCG/100ML IV SOLN
0.1000 ug/kg/h | INTRAVENOUS | Status: AC
  Administered 2017-12-26: .7 ug/kg/h via INTRAVENOUS
  Filled 2017-12-25: qty 100

## 2017-12-25 MED ORDER — MILRINONE LACTATE IN DEXTROSE 20-5 MG/100ML-% IV SOLN
0.1250 ug/kg/min | INTRAVENOUS | Status: AC
  Administered 2017-12-26: 0.25 ug/kg/min via INTRAVENOUS
  Filled 2017-12-25: qty 100

## 2017-12-25 MED ORDER — SODIUM CHLORIDE 0.9 % IV SOLN
1500.0000 mg | INTRAVENOUS | Status: AC
  Administered 2017-12-26: 1250 mg via INTRAVENOUS
  Filled 2017-12-25: qty 1500

## 2017-12-25 MED ORDER — NITROGLYCERIN IN D5W 200-5 MCG/ML-% IV SOLN
2.0000 ug/min | INTRAVENOUS | Status: AC
  Administered 2017-12-26: 20 ug/min via INTRAVENOUS
  Filled 2017-12-25: qty 250

## 2017-12-25 MED ORDER — MAGNESIUM SULFATE 50 % IJ SOLN
40.0000 meq | INTRAMUSCULAR | Status: DC
  Filled 2017-12-25: qty 9.85

## 2017-12-25 MED ORDER — ALBUTEROL SULFATE (2.5 MG/3ML) 0.083% IN NEBU
2.5000 mg | INHALATION_SOLUTION | Freq: Once | RESPIRATORY_TRACT | Status: AC
  Administered 2017-12-25: 2.5 mg via RESPIRATORY_TRACT

## 2017-12-25 MED ORDER — TRANEXAMIC ACID (OHS) PUMP PRIME SOLUTION
2.0000 mg/kg | INTRAVENOUS | Status: DC
  Filled 2017-12-25: qty 1.7

## 2017-12-25 MED ORDER — SODIUM CHLORIDE 0.9 % IV SOLN
INTRAVENOUS | Status: DC
  Filled 2017-12-25: qty 30

## 2017-12-25 MED ORDER — DOPAMINE-DEXTROSE 3.2-5 MG/ML-% IV SOLN
0.0000 ug/kg/min | INTRAVENOUS | Status: DC
  Filled 2017-12-25: qty 250

## 2017-12-25 MED ORDER — SODIUM CHLORIDE 0.9 % IV SOLN
1.5000 mg/kg/h | INTRAVENOUS | Status: AC
  Administered 2017-12-26: 1.5 mg/kg/h via INTRAVENOUS
  Filled 2017-12-25: qty 25

## 2017-12-25 MED ORDER — TRANEXAMIC ACID (OHS) BOLUS VIA INFUSION
15.0000 mg/kg | INTRAVENOUS | Status: AC
  Administered 2017-12-26: 1272 mg via INTRAVENOUS
  Filled 2017-12-25: qty 1272

## 2017-12-25 MED ORDER — SODIUM CHLORIDE 0.9 % IV SOLN
750.0000 mg | INTRAVENOUS | Status: DC
  Filled 2017-12-25: qty 750

## 2017-12-25 NOTE — Progress Notes (Signed)
Anesthesia Chart Review:  Case:  [id number] Date/Time:  12/26/17 0715   Procedures:      CORONARY ARTERY BYPASS GRAFTING (CABG) (N/A Chest)     TRANSESOPHAGEAL ECHOCARDIOGRAM (TEE) (N/A )     LEFT POSSIBLE RADIAL ARTERY HARVEST (Left Arm Lower)   Anesthesia type:  General   Pre-op diagnosis:  CAD   [facility] / [facility]   Surgeon:  Kerin Perna, MD      DISCUSSION: Patient is a 62 year old male scheduled for the above procedure. History includes never smoker, DM2, MI, CAD ("silent MI" with 3V CAD and RCA occlusion), GERD, neuropathy.  If no acute changes then I anticipate that he can proceed as planned.  VS: BP (!) 173/88   Pulse 65   Temp 36.5 C   Resp 20   Ht 6\' 2"  (1.88 m)   Wt 186 lb 14.4 oz (84.8 kg)   SpO2 100%   BMI 24.00 kg/m   PROVIDERS: [name], MD is PCP  Gypsy Balsam, MD is cardiologist   LABS: Labs reviewed: Acceptable for surgery. (all labs ordered are listed, but only abnormal results are displayed)  Labs Reviewed  GLUCOSE, CAPILLARY - Abnormal; Notable for the following components:      Result Value   Glucose-Capillary 135 (*)    All other components within normal limits  COMPREHENSIVE METABOLIC PANEL - Abnormal; Notable for the following components:   Glucose, Bld 130 (*)    Total Protein 6.4 (*)    All other components within normal limits  HEMOGLOBIN A1C - Abnormal; Notable for the following components:   Hgb A1c MFr Bld 7.7 (*)    All other components within normal limits  URINALYSIS, ROUTINE W REFLEX MICROSCOPIC - Abnormal; Notable for the following components:   Hgb urine dipstick SMALL (*)    Protein, ur 30 (*)    All other components within normal limits  SURGICAL PCR SCREEN  APTT  CBC  PROTIME-INR  BLOOD GAS, ARTERIAL  TYPE AND SCREEN  ABO/RH    OTHER: PFTs 12/25/17: FVC 5.32 (97%), FEV1 3.89 (94%), DLCO unc 24.95 (66%).   IMAGES: CXR 12/25/17:  IMPRESSION: Normal heart size.  No acute cardiopulmonary  disease.  EKG: 12/19/17: NSR, cannot rule out inferior infarct (age undetermined).   CV: Cardiac cath 12/19/17:  Severe three-vessel CAD with heavy calcification within the left coronary system.    Chronic total occlusion of the mid to distal RCA.  Feels late by left to right collaterals  Normal left main.  Diffuse 50 to 70% stenosis from proximal to mid LAD with FFR across the segment of 0.63.  The first and second diagonals contain 70% ostial stenoses.  A diagonals a small and probably not graftable.  Severe obstruction in the mid circumflex, 90%.  It obtuse marginal also contains 75% obstruction.  Mild reduction in LV function with EF 45 to 50%.  Inferobasal and mid to distal anterior wall hypokinesis.  Normal LVEDP. RECOMMENDATIONS: Curtail moderate to heavy physical activity as the patient clearly has a set up for silent ischemia and arrhythmia.  TCTS consultation to consider surgical revascularization given decreased LV function and type 2 diabetes.  The surgical office has been contacted for the patient to be seen as an OP.  Continue aggressive risk factor modification.  Stress echo 12/15/17: Study Conclusions - Stress ECG conclusions: There were no stress arrhythmias or   conduction abnormalities. The stress ECG was consistent with   myocardial  ischemia. - Immediate post stress: LV size was enlarged and increased from   baseline. Hypokinesis of the basal and mid inferoseptal and basal   and mid inferior LV myocardium. The stress echo is abnormal with   new hypokinesia- ischemia, and an increase in LV volume.  Carotid U/S 12/25/17 (PRELIMINARY): 1-39% Bilaterally   Past Medical History:  Diagnosis Date  . Arthritis   . Coronary artery disease   . Diabetes mellitus without complication (HCC)    Type II  . GERD (gastroesophageal reflux disease)   . Myocardial infarction [facility](HCC)    "Silent"  . Neuropathy     Past Surgical History:  Procedure Laterality Date  .  CATARACT EXTRACTION Bilateral   . COLONOSCOPY W/ POLYPECTOMY    . INTRAVASCULAR PRESSURE WIRE/FFR STUDY N/A 12/19/2017   Procedure: INTRAVASCULAR PRESSURE WIRE/FFR STUDY;  Surgeon: Lyn RecordsSmith, Henry W, MD;  [facility];  Service: Cardiovascular;  Laterality: N/A;  . LEFT HEART CATH AND CORONARY ANGIOGRAPHY N/A 12/19/2017   Procedure: LEFT HEART CATH AND CORONARY ANGIOGRAPHY;  Surgeon: Lyn RecordsSmith, Henry W, MD;  [facility];  Service: Cardiovascular;  Laterality: N/A;  . VASECTOMY      MEDICATIONS: . calcium carbonate (TUMS - DOSED IN MG ELEMENTAL CALCIUM) 500 MG chewable tablet  . aspirin EC 81 MG tablet  . atorvastatin (LIPITOR) 40 MG tablet  . insulin detemir (LEVEMIR) 100 UNIT/ML injection  . metFORMIN (GLUCOPHAGE) 500 MG tablet  . metoprolol tartrate (LOPRESSOR) 25 MG tablet  . sitaGLIPtin (JANUVIA) 100 MG tablet   No current facility-administered medications for this encounter.    Melene Muller. [START ON 12/26/2017] cefUROXime (ZINACEF) 1.5 g in sodium chloride 0.9 % 100 mL IVPB  . [START ON 12/26/2017] cefUROXime (ZINACEF) 750 mg in sodium chloride 0.9 % 100 mL IVPB  . [START ON 12/26/2017] dexmedetomidine (PRECEDEX) 400 MCG/100ML (4 mcg/mL) infusion  . [START ON 12/26/2017] DOPamine (INTROPIN) 800 mg in dextrose 5 % 250 mL (3.2 mg/mL) infusion  . [START ON 12/26/2017] EPINEPHrine (ADRENALIN) 4 mg in dextrose 5 % 250 mL (0.016 mg/mL) infusion  . [START ON 12/26/2017] heparin 2,500 Units, papaverine 30 mg in electrolyte-148 (PLASMALYTE-148) 500 mL irrigation  . [START ON 12/26/2017] heparin 30,000 units/NS 1000 mL solution for CELLSAVER  . [START ON 12/26/2017] insulin regular (NOVOLIN R,HUMULIN R) 100 Units in sodium chloride 0.9 % 100 mL (1 Units/mL) infusion  . [START ON 12/26/2017] magnesium sulfate (IV Push/IM) injection 40 mEq  . [START ON 12/26/2017] milrinone (PRIMACOR) 20 MG/100 ML (0.2 mg/mL) infusion  . [START ON 12/26/2017] nitroGLYCERIN 50 mg in dextrose 5 % 250 mL (0.2  mg/mL) infusion  . [START ON 12/26/2017] phenylephrine (NEO-SYNEPHRINE) 20 mg in sodium chloride 0.9 % 250 mL (0.08 mg/mL) infusion  . [START ON 12/26/2017] potassium chloride injection 80 mEq  . [START ON 12/26/2017] tranexamic acid (CYKLOKAPRON) 2,500 mg in sodium chloride 0.9 % 250 mL (10 mg/mL) infusion  . [START ON 12/26/2017] tranexamic acid (CYKLOKAPRON) bolus via infusion - over 30 minutes 1,272 mg  . [START ON 12/26/2017] tranexamic acid (CYKLOKAPRON) pump prime solution 170 mg  . [START ON 12/26/2017] vancomycin (VANCOCIN) 1,500 mg in sodium chloride 0.9 % 250 mL IVPB   Velna Ochsllison Morissa Obeirne, PA-C [facility]Anesthesiology Phone [phone] 12/25/2017 3:45 PM

## 2017-12-25 NOTE — Progress Notes (Signed)
Pre-op Cardiac Surgery  Carotid Findings:  1-39% Bilaterally  Upper Extremity Right Left  Brachial Pressures 133 141  Radial Waveforms Triphasic Triphasic  Ulnar Waveforms Triphasic Triphasic  Palmar Arch (Allen's Test) WNL w/ compression WNL w/ compression     Lower  Extremity Right Left  Dorsalis Pedis 171 101  Anterior Tibial    Posterior Tibial 175 120  Ankle/Brachial Indices 1.2 0.85    Findings:  Right ABI is normal at rest. Left ABI is mild for arterial insufficiency at rest.   [name] 12/25/2017 3:28 PM

## 2017-12-25 NOTE — Progress Notes (Signed)
PRELIMINARY RESULTS Lower Extremity Vein Mapping   \   [name] 12/25/2017 3:37 PM

## 2017-12-25 NOTE — Pre-Procedure Instructions (Signed)
[name]  12/25/2017     [id number] Rosalita Levan, [place] - [address] [place] [address] Phone: [phone] Fax: [phone]   Your procedure is scheduled on Tuesday, December 26, 2017  Report to [facility] Admitting at 5:30 A.M.  Call this number if you have problems the morning of surgery:  [phone]  PRE- OP DESK   Remember:  Do not eat or drink after midnight.    Take these medicines the morning of surgery with A SIP OF WATER :  metoprolol tartrate (LOPRESSOR)  WHAT DO I DO ABOUT MY DIABETES MEDICATION? Do not take oral diabetes medicines (pills) the morning of surgery such as sitaGLIPtin (JANUVIA) and metFORMIN (GLUCOPHAGE)  THE NIGHT BEFORE SURGERY, take 12 units of  detemir (LEVEMIR)  Insulin.  Stop taking vitamins, fish oil and herbal medications. Do not take any NSAIDs ie: Ibuprofen, Advil, Naproxen (Aleve), Motrin, BC and Goody Powder; stop now.  How to Manage Your Diabetes Before and After Surgery  Why is it important to control my blood sugar before and after surgery? . Improving blood sugar levels before and after surgery helps healing and can limit problems. . A way of improving blood sugar control is eating a healthy diet by: o  Eating less sugar and carbohydrates o  Increasing activity/exercise o  Talking with your doctor about reaching your blood sugar goals . High blood sugars (greater than 180 mg/dL) can raise your risk of infections and slow your recovery, so you will need to focus on controlling your diabetes during the weeks before surgery. . Make sure that the doctor who takes care of your diabetes knows about your planned surgery including the date and location.  How do I manage my blood sugar before surgery? . Check your blood sugar at least 4 times a day, starting 2 days before surgery, to make sure that the level is not too high or low. o Check your blood sugar the morning of your surgery when  you wake up and every 2 hours until you get to the Short Stay unit. . If your blood sugar is less than 70 mg/dL, you will need to treat for low blood sugar: o Do not take insulin. o Treat a low blood sugar (less than 70 mg/dL) with  cup of clear juice (cranberry or apple), 4 glucose tablets, OR glucose gel. Recheck blood sugar in 15 minutes after treatment (to make sure it is greater than 70 mg/dL). If your blood sugar is not greater than 70 mg/dL on recheck, call [phone] o  for further instructions. . Report your blood sugar to the short stay nurse when you get to Short Stay.  . If you are admitted to the hospital after surgery: o Your blood sugar will be checked by the staff and you will probably be given insulin after surgery (instead of oral diabetes medicines) to make sure you have good blood sugar levels. o The goal for blood sugar control after surgery is 80-180 mg/dL  Special Instructions:  Before surgery, you can play an important role. Because skin is not sterile, your skin needs to be as free of germs as possible. You can reduce the number of germs on your skin by washing with CHG (chlorahexidine gluconate) Soap before surgery.  CHG is an antiseptic cleaner which kills germs and bonds with the skin to continue killing germs even after washing.    Oral Hygiene is also important to reduce  your risk of infection.  Remember - BRUSH YOUR TEETH THE MORNING OF SURGERY WITH YOUR REGULAR TOOTHPASTE  Please do not use if you have an allergy to CHG or antibacterial soaps. If your skin becomes reddened/irritated stop using the CHG.  Do not shave (including legs and underarms) for at least 48 hours prior to first CHG shower. It is OK to shave your face.  Please follow these instructions carefully.   1. Shower the NIGHT BEFORE SURGERY and the MORNING OF SURGERY with CHG.   2. If you chose to wash your hair, wash your hair first as usual with your normal shampoo.  3. After you shampoo,  rinse your hair and body thoroughly to remove the shampoo.    Wash your face and private area with the soap you use at home, then rinse.  4. Use CHG as you would any other liquid soap. You can apply CHG directly to the skin and wash gently with a scrungie or a clean washcloth.   5. Apply the CHG Soap to your body ONLY FROM THE NECK DOWN.  Do not use on open wounds or open sores. Avoid contact with your eyes, ears, mouth and genitals (private parts).   6. Wash thoroughly, paying special attention to the area where your surgery will be performed.  7. Thoroughly rinse your body with warm water from the neck down.  8. DO NOT shower/wash with your normal soap after using and rinsing off the CHG Soap.  9. Pat yourself dry with a CLEAN TOWEL.  10. Wear CLEAN PAJAMAS to bed the night before surgery, wear comfortable clothes the morning of surgery  11. Place CLEAN SHEETS on your bed the night of your first shower and DO NOT SLEEP WITH PETS.  Day of Surgery:  Shower as Above Do not apply any deodorants/lotions.  Please wear clean clothes to the hospital/surgery center.   Remember to brush your teeth WITH YOUR REGULAR TOOTHPASTE.    Do not wear jewelry, make-up or nail polish.  Do not wear lotions, powders, or perfumes, or deodorant.  Do not shave 48 hours prior to surgery.  Men may shave face and neck.  Do not bring valuables to the hospital.  [facility] is [facility] for any belongings or valuables.  Contacts, dentures or bridgework may not be worn into surgery.  Leave your suitcase in the car.  After surgery it may be brought to your room. Please read over the following fact sheets that you were given. Pain Booklet, Coughing and Deep Breathing, MRSA Information and Surgical Site Infection Prevention

## 2017-12-25 NOTE — Pre-Procedure Instructions (Signed)
Colin MiresMilton A [name]  12/25/2017     [id number] [name]- [place], [place] - [address] [place] [address] Phone: [phone] Fax: [phone]   Your procedure is scheduled on Tuesday, December 26, 2017  [facility] at 5:30 A.M.  Call this number if you have problems the morning of surgery:  [phone]   Remember:  Do not eat or drink after midnight.    Take these medicines the morning of surgery with A SIP OF WATER : metoprolol tartrate (LOPRESSOR) Stop taking vitamins, fish oil and herbal medications. Do not take any NSAIDs ie: Ibuprofen, Advil, Naproxen (Aleve), Motrin, BC and Goody Powder; stop now.   Brush your teeth the morning of surgery with your regular toothpaste.   How to Manage Your Diabetes Before and After Surgery  Why is it important to control my blood sugar before and after surgery? . Improving blood sugar levels before and after surgery helps healing and can limit problems. . A way of improving blood sugar control is eating a healthy diet by: o  Eating less sugar and carbohydrates o  Increasing activity/exercise o  Talking with your doctor about reaching your blood sugar goals . High blood sugars (greater than 180 mg/dL) can raise your risk of infections and slow your recovery, so you will need to focus on controlling your diabetes during the weeks before surgery. . Make sure that the doctor who takes care of your diabetes knows about your planned surgery including the date and location.  How do I manage my blood sugar before surgery? . Check your blood sugar at least 4 times a day, starting 2 days before surgery, to make sure that the level is not too high or low. o Check your blood sugar the morning of your surgery when you wake up and every 2 hours until you get to the Short Stay unit. . If your blood sugar is less than 70 mg/dL, you will need to treat for low blood sugar: o Do not take  insulin. o Treat a low blood sugar (less than 70 mg/dL) with  cup of clear juice (cranberry or apple), 4 glucose tablets, OR glucose gel. Recheck blood sugar in 15 minutes after treatment (to make sure it is greater than 70 mg/dL). If your blood sugar is not greater than 70 mg/dL on recheck, call [phone] o  for further instructions. . Report your blood sugar to the short stay nurse when you get to Short Stay.  . If you are admitted to the hospital after surgery: o Your blood sugar will be checked by the staff and you will probably be given insulin after surgery (instead of oral diabetes medicines) to make sure you have good blood sugar levels. o The goal for blood sugar control after surgery is 80-180 mg/dL  WHAT DO I DO ABOUT MY DIABETES MEDICATION?  Marland Kitchen. Do not take oral diabetes medicines (pills) the morning of surgery such as sitaGLIPtin (JANUVIA) and metFORMIN (GLUCOPHAGE)  . THE NIGHT BEFORE SURGERY, take 12 units of  detemir (LEVEMIR)  Insulin.  Reviewed and Endorsed by [facility] Patient Education Committee, August 2015  Do not wear jewelry, make-up or nail polish.  Do not wear lotions, powders, or perfumes, or deodorant.  Do not shave 48 hours prior to surgery.  Men may shave face and neck.  Do not bring valuables to the hospital.  [facility] is not responsible for any belongings or valuables.  Contacts, dentures or bridgework may not be worn into surgery.  Leave your suitcase in the car.  After surgery it may be brought to your room.  Special instructions: Shower the night before surgery and the morning of surgery with CHG. Please read over the following fact sheets that you were given. Pain Booklet, Coughing and Deep Breathing, MRSA Information and Surgical Site Infection Prevention

## 2017-12-26 ENCOUNTER — Inpatient Hospital Stay (HOSPITAL_COMMUNITY): Payer: BLUE CROSS/BLUE SHIELD | Admitting: Anesthesiology

## 2017-12-26 ENCOUNTER — Inpatient Hospital Stay (HOSPITAL_COMMUNITY): Payer: BLUE CROSS/BLUE SHIELD

## 2017-12-26 ENCOUNTER — Inpatient Hospital Stay (HOSPITAL_COMMUNITY)
Admission: RE | Disposition: A | Discharge status: Home / Self Care | Payer: Self-pay | Source: Home / Self Care | Attending: Cardiothoracic Surgery

## 2017-12-26 ENCOUNTER — Inpatient Hospital Stay (HOSPITAL_COMMUNITY)
Admission: RE | Admit: 2017-12-26 | Discharge: 2017-12-30 | DRG: 236 | Disposition: A | Discharge status: Home / Self Care | Payer: BLUE CROSS/BLUE SHIELD | Attending: Cardiothoracic Surgery | Admitting: Cardiothoracic Surgery

## 2017-12-26 ENCOUNTER — Encounter (HOSPITAL_COMMUNITY): Payer: Self-pay | Admitting: *Deleted

## 2017-12-26 DIAGNOSIS — E8770 Fluid overload, unspecified: Secondary | ICD-10-CM | POA: Diagnosis not present

## 2017-12-26 DIAGNOSIS — Z951 Presence of aortocoronary bypass graft: Secondary | ICD-10-CM

## 2017-12-26 DIAGNOSIS — Z86010 Personal history of colonic polyps: Secondary | ICD-10-CM | POA: Diagnosis not present

## 2017-12-26 DIAGNOSIS — I340 Nonrheumatic mitral (valve) insufficiency: Secondary | ICD-10-CM | POA: Diagnosis not present

## 2017-12-26 DIAGNOSIS — Z794 Long term (current) use of insulin: Secondary | ICD-10-CM

## 2017-12-26 DIAGNOSIS — K219 Gastro-esophageal reflux disease without esophagitis: Secondary | ICD-10-CM | POA: Diagnosis present

## 2017-12-26 DIAGNOSIS — Z7982 Long term (current) use of aspirin: Secondary | ICD-10-CM

## 2017-12-26 DIAGNOSIS — Z885 Allergy status to narcotic agent status: Secondary | ICD-10-CM | POA: Diagnosis not present

## 2017-12-26 DIAGNOSIS — J9811 Atelectasis: Secondary | ICD-10-CM | POA: Diagnosis not present

## 2017-12-26 DIAGNOSIS — I8390 Asymptomatic varicose veins of unspecified lower extremity: Secondary | ICD-10-CM | POA: Diagnosis present

## 2017-12-26 DIAGNOSIS — I2510 Atherosclerotic heart disease of native coronary artery without angina pectoris: Secondary | ICD-10-CM | POA: Diagnosis not present

## 2017-12-26 DIAGNOSIS — Z833 Family history of diabetes mellitus: Secondary | ICD-10-CM | POA: Diagnosis not present

## 2017-12-26 DIAGNOSIS — E1140 Type 2 diabetes mellitus with diabetic neuropathy, unspecified: Secondary | ICD-10-CM | POA: Diagnosis present

## 2017-12-26 DIAGNOSIS — E7800 Pure hypercholesterolemia, unspecified: Secondary | ICD-10-CM | POA: Diagnosis not present

## 2017-12-26 DIAGNOSIS — I10 Essential (primary) hypertension: Secondary | ICD-10-CM | POA: Diagnosis not present

## 2017-12-26 DIAGNOSIS — M1990 Unspecified osteoarthritis, unspecified site: Secondary | ICD-10-CM | POA: Diagnosis not present

## 2017-12-26 DIAGNOSIS — I252 Old myocardial infarction: Secondary | ICD-10-CM | POA: Diagnosis not present

## 2017-12-26 DIAGNOSIS — I5181 Takotsubo syndrome: Secondary | ICD-10-CM | POA: Diagnosis not present

## 2017-12-26 DIAGNOSIS — R0902 Hypoxemia: Secondary | ICD-10-CM | POA: Diagnosis not present

## 2017-12-26 HISTORY — PX: TEE WITHOUT CARDIOVERSION: SHX5443

## 2017-12-26 HISTORY — PX: ENDOVEIN HARVEST OF GREATER SAPHENOUS VEIN: SHX5059

## 2017-12-26 HISTORY — PX: CORONARY ARTERY BYPASS GRAFT: SHX141

## 2017-12-26 LAB — POCT I-STAT, CHEM 8
BUN: 18 mg/dL (ref 6–20)
BUN: 14 mg/dL (ref 6–20)
BUN: 14 mg/dL (ref 6–20)
BUN: 14 mg/dL (ref 6–20)
BUN: 15 mg/dL (ref 6–20)
BUN: 13 mg/dL (ref 6–20)
BUN: 16 mg/dL (ref 6–20)
Calcium, Ion: 1.3 mmol/L (ref 1.15–1.40)
Calcium, Ion: 1.08 mmol/L — ABNORMAL LOW (ref 1.15–1.40)
Calcium, Ion: 1.22 mmol/L (ref 1.15–1.40)
Calcium, Ion: 1.11 mmol/L — ABNORMAL LOW (ref 1.15–1.40)
Calcium, Ion: 1.13 mmol/L — ABNORMAL LOW (ref 1.15–1.40)
Calcium, Ion: 1.06 mmol/L — ABNORMAL LOW (ref 1.15–1.40)
Calcium, Ion: 1.14 mmol/L — ABNORMAL LOW (ref 1.15–1.40)
Chloride: 104 mmol/L (ref 98–111)
Chloride: 101 mmol/L (ref 98–111)
Chloride: 107 mmol/L (ref 98–111)
Chloride: 103 mmol/L (ref 98–111)
Chloride: 104 mmol/L (ref 98–111)
Chloride: 104 mmol/L (ref 98–111)
Chloride: 106 mmol/L (ref 98–111)
Creatinine, Ser: 0.7 mg/dL (ref 0.61–1.24)
Creatinine, Ser: 0.6 mg/dL — ABNORMAL LOW (ref 0.61–1.24)
Creatinine, Ser: 0.7 mg/dL (ref 0.61–1.24)
Creatinine, Ser: 0.6 mg/dL — ABNORMAL LOW (ref 0.61–1.24)
Creatinine, Ser: 0.7 mg/dL (ref 0.61–1.24)
Creatinine, Ser: 0.6 mg/dL — ABNORMAL LOW (ref 0.61–1.24)
Creatinine, Ser: 0.8 mg/dL (ref 0.61–1.24)
Glucose, Bld: 108 mg/dL — ABNORMAL HIGH (ref 70–99)
Glucose, Bld: 68 mg/dL — ABNORMAL LOW (ref 70–99)
Glucose, Bld: 80 mg/dL (ref 70–99)
Glucose, Bld: 100 mg/dL — ABNORMAL HIGH (ref 70–99)
Glucose, Bld: 102 mg/dL — ABNORMAL HIGH (ref 70–99)
Glucose, Bld: 97 mg/dL (ref 70–99)
Glucose, Bld: 176 mg/dL — ABNORMAL HIGH (ref 70–99)
HCT: 34 % — ABNORMAL LOW (ref 39.0–52.0)
HCT: 26 % — ABNORMAL LOW (ref 39.0–52.0)
HCT: 28 % — ABNORMAL LOW (ref 39.0–52.0)
HCT: 26 % — ABNORMAL LOW (ref 39.0–52.0)
HCT: 27 % — ABNORMAL LOW (ref 39.0–52.0)
HCT: 24 % — ABNORMAL LOW (ref 39.0–52.0)
HCT: 26 % — ABNORMAL LOW (ref 39.0–52.0)
Hemoglobin: 11.6 g/dL — ABNORMAL LOW (ref 13.0–17.0)
Hemoglobin: 8.8 g/dL — ABNORMAL LOW (ref 13.0–17.0)
Hemoglobin: 9.5 g/dL — ABNORMAL LOW (ref 13.0–17.0)
Hemoglobin: 8.8 g/dL — ABNORMAL LOW (ref 13.0–17.0)
Hemoglobin: 9.2 g/dL — ABNORMAL LOW (ref 13.0–17.0)
Hemoglobin: 8.2 g/dL — ABNORMAL LOW (ref 13.0–17.0)
Hemoglobin: 8.8 g/dL — ABNORMAL LOW (ref 13.0–17.0)
Potassium: 4.3 mmol/L (ref 3.5–5.1)
Potassium: 3.7 mmol/L (ref 3.5–5.1)
Potassium: 3.9 mmol/L (ref 3.5–5.1)
Potassium: 4.4 mmol/L (ref 3.5–5.1)
Potassium: 4.6 mmol/L (ref 3.5–5.1)
Potassium: 4 mmol/L (ref 3.5–5.1)
Potassium: 4.5 mmol/L (ref 3.5–5.1)
Sodium: 140 mmol/L (ref 135–145)
Sodium: 139 mmol/L (ref 135–145)
Sodium: 140 mmol/L (ref 135–145)
Sodium: 139 mmol/L (ref 135–145)
Sodium: 139 mmol/L (ref 135–145)
Sodium: 138 mmol/L (ref 135–145)
Sodium: 138 mmol/L (ref 135–145)
TCO2: 24 mmol/L (ref 22–32)
TCO2: 24 mmol/L (ref 22–32)
TCO2: 24 mmol/L (ref 22–32)
TCO2: 24 mmol/L (ref 22–32)
TCO2: 24 mmol/L (ref 22–32)
TCO2: 23 mmol/L (ref 22–32)
TCO2: 23 mmol/L (ref 22–32)

## 2017-12-26 LAB — POCT I-STAT 3, ART BLOOD GAS (G3+)
Acid-Base Excess: 1 mmol/L (ref 0.0–2.0)
Acid-base deficit: 3 mmol/L — ABNORMAL HIGH (ref 0.0–2.0)
Acid-base deficit: 3 mmol/L — ABNORMAL HIGH (ref 0.0–2.0)
Bicarbonate: 24.2 mmol/L (ref 20.0–28.0)
Bicarbonate: 24.3 mmol/L (ref 20.0–28.0)
Bicarbonate: 22.3 mmol/L (ref 20.0–28.0)
Bicarbonate: 20.1 mmol/L (ref 20.0–28.0)
Bicarbonate: 20.6 mmol/L (ref 20.0–28.0)
O2 Saturation: 100 %
O2 Saturation: 100 %
O2 Saturation: 99 %
O2 Saturation: 100 %
O2 Saturation: 100 %
Patient temperature: 35
Patient temperature: 36.9
TCO2: 25 mmol/L (ref 22–32)
TCO2: 25 mmol/L (ref 22–32)
TCO2: 23 mmol/L (ref 22–32)
TCO2: 21 mmol/L — ABNORMAL LOW (ref 22–32)
TCO2: 22 mmol/L (ref 22–32)
pCO2 arterial: 38 mmHg (ref 32.0–48.0)
pCO2 arterial: 34.4 mmHg (ref 32.0–48.0)
pCO2 arterial: 24.7 mmHg — ABNORMAL LOW (ref 32.0–48.0)
pCO2 arterial: 26.6 mmHg — ABNORMAL LOW (ref 32.0–48.0)
pCO2 arterial: 30.5 mmHg — ABNORMAL LOW (ref 32.0–48.0)
pH, Arterial: 7.412 (ref 7.350–7.450)
pH, Arterial: 7.457 — ABNORMAL HIGH (ref 7.350–7.450)
pH, Arterial: 7.557 — ABNORMAL HIGH (ref 7.350–7.450)
pH, Arterial: 7.486 — ABNORMAL HIGH (ref 7.350–7.450)
pH, Arterial: 7.437 (ref 7.350–7.450)
pO2, Arterial: 349 mmHg — ABNORMAL HIGH (ref 83.0–108.0)
pO2, Arterial: 245 mmHg — ABNORMAL HIGH (ref 83.0–108.0)
pO2, Arterial: 124 mmHg — ABNORMAL HIGH (ref 83.0–108.0)
pO2, Arterial: 198 mmHg — ABNORMAL HIGH (ref 83.0–108.0)
pO2, Arterial: 158 mmHg — ABNORMAL HIGH (ref 83.0–108.0)

## 2017-12-26 LAB — CBC
HCT: 30.4 % — ABNORMAL LOW (ref 39.0–52.0)
HEMATOCRIT: 27.9 % — AB (ref 39.0–52.0)
Hemoglobin: 9.5 g/dL — ABNORMAL LOW (ref 13.0–17.0)
Hemoglobin: 10 g/dL — ABNORMAL LOW (ref 13.0–17.0)
MCH: 31.1 pg (ref 26.0–34.0)
MCH: 30.4 pg (ref 26.0–34.0)
MCHC: 34.1 g/dL (ref 30.0–36.0)
MCHC: 32.9 g/dL (ref 30.0–36.0)
MCV: 91.5 fL (ref 78.0–100.0)
MCV: 92.4 fL (ref 78.0–100.0)
Platelets: 125 10*3/uL — ABNORMAL LOW (ref 150–400)
Platelets: 129 10*3/uL — ABNORMAL LOW (ref 150–400)
RBC: 3.05 MIL/uL — AB (ref 4.22–5.81)
RBC: 3.29 MIL/uL — ABNORMAL LOW (ref 4.22–5.81)
RDW: 13.1 % (ref 11.5–15.5)
RDW: 13.2 % (ref 11.5–15.5)
WBC: 12.8 10*3/uL — ABNORMAL HIGH (ref 4.0–10.5)
WBC: 11.9 10*3/uL — ABNORMAL HIGH (ref 4.0–10.5)

## 2017-12-26 LAB — GLUCOSE, CAPILLARY
Glucose-Capillary: 105 mg/dL — ABNORMAL HIGH (ref 70–99)
Glucose-Capillary: 107 mg/dL — ABNORMAL HIGH (ref 70–99)
Glucose-Capillary: 122 mg/dL — ABNORMAL HIGH (ref 70–99)
Glucose-Capillary: 128 mg/dL — ABNORMAL HIGH (ref 70–99)
Glucose-Capillary: 136 mg/dL — ABNORMAL HIGH (ref 70–99)
Glucose-Capillary: 156 mg/dL — ABNORMAL HIGH (ref 70–99)

## 2017-12-26 LAB — HEMOGLOBIN AND HEMATOCRIT, BLOOD
HCT: 28.1 % — ABNORMAL LOW (ref 39.0–52.0)
Hemoglobin: 9.3 g/dL — ABNORMAL LOW (ref 13.0–17.0)

## 2017-12-26 LAB — POCT I-STAT 4, (NA,K, GLUC, HGB,HCT)
Glucose, Bld: 112 mg/dL — ABNORMAL HIGH (ref 70–99)
HCT: 26 % — ABNORMAL LOW (ref 39.0–52.0)
Hemoglobin: 8.8 g/dL — ABNORMAL LOW (ref 13.0–17.0)
Potassium: 3.6 mmol/L (ref 3.5–5.1)
Sodium: 140 mmol/L (ref 135–145)

## 2017-12-26 LAB — CREATININE, SERUM
Creatinine, Ser: 0.92 mg/dL (ref 0.61–1.24)
GFR calc Af Amer: >60 mL/min (ref >60)
GFR calc non Af Amer: >60 mL/min (ref >60)

## 2017-12-26 LAB — MAGNESIUM: Magnesium: 2.6 mg/dL — ABNORMAL HIGH (ref 1.7–2.4)

## 2017-12-26 LAB — PROTIME-INR
INR: 1.36
Prothrombin Time: 16.7 seconds — ABNORMAL HIGH (ref 11.4–15.2)

## 2017-12-26 LAB — PLATELET COUNT: Platelets: 163 10*3/uL (ref 150–400)

## 2017-12-26 LAB — APTT: aPTT: 33 seconds (ref 24–36)

## 2017-12-26 SURGERY — CORONARY ARTERY BYPASS GRAFTING (CABG)
Anesthesia: General | Site: Chest

## 2017-12-26 MED ORDER — HEPARIN SODIUM (PORCINE) 1000 UNIT/ML IJ SOLN
INTRAMUSCULAR | Status: AC
  Filled 2017-12-26: qty 1

## 2017-12-26 MED ORDER — MORPHINE SULFATE (PF) 2 MG/ML IV SOLN
1.0000 mg | INTRAVENOUS | Status: AC | PRN
  Administered 2017-12-26: 2 mg via INTRAVENOUS

## 2017-12-26 MED ORDER — DEXTROSE 50 % IV SOLN
INTRAVENOUS | Status: AC
  Filled 2017-12-26: qty 50

## 2017-12-26 MED ORDER — METOPROLOL TARTRATE 12.5 MG HALF TABLET
12.5000 mg | ORAL_TABLET | Freq: Once | ORAL | Status: AC
  Administered 2017-12-26: 12.5 mg via ORAL
  Filled 2017-12-26: qty 1

## 2017-12-26 MED ORDER — SODIUM CHLORIDE 0.9 % IV SOLN
1.5000 g | Freq: Two times a day (BID) | INTRAVENOUS | Status: AC
  Administered 2017-12-26 – 2017-12-28 (×4): 1.5 g via INTRAVENOUS
  Filled 2017-12-26 (×4): qty 1.5

## 2017-12-26 MED ORDER — MAGNESIUM SULFATE 4 GM/100ML IV SOLN
4.0000 g | Freq: Once | INTRAVENOUS | Status: AC
  Administered 2017-12-26: 4 g via INTRAVENOUS
  Filled 2017-12-26: qty 100

## 2017-12-26 MED ORDER — HEMOSTATIC AGENTS (NO CHARGE) OPTIME
TOPICAL | Status: DC | PRN
  Administered 2017-12-26: 1 via TOPICAL

## 2017-12-26 MED ORDER — ACETAMINOPHEN 500 MG PO TABS
1000.0000 mg | ORAL_TABLET | Freq: Four times a day (QID) | ORAL | Status: DC
  Administered 2017-12-26 – 2017-12-30 (×13): 1000 mg via ORAL
  Filled 2017-12-26 (×13): qty 2

## 2017-12-26 MED ORDER — SODIUM CHLORIDE 0.9 % IV SOLN
INTRAVENOUS | Status: DC
  Administered 2017-12-26: 15:00:00 via INTRAVENOUS

## 2017-12-26 MED ORDER — SODIUM CHLORIDE 0.9 % IV SOLN
INTRAVENOUS | Status: DC | PRN
  Administered 2017-12-26: 750 mg via INTRAVENOUS

## 2017-12-26 MED ORDER — BISACODYL 5 MG PO TBEC
10.0000 mg | DELAYED_RELEASE_TABLET | Freq: Every day | ORAL | Status: DC
  Administered 2017-12-27 – 2017-12-28 (×2): 10 mg via ORAL
  Filled 2017-12-26 (×3): qty 2

## 2017-12-26 MED ORDER — PROTAMINE SULFATE 10 MG/ML IV SOLN
INTRAVENOUS | Status: DC | PRN
  Administered 2017-12-26: 50 mg via INTRAVENOUS
  Administered 2017-12-26: 30 mg via INTRAVENOUS
  Administered 2017-12-26: 20 mg via INTRAVENOUS
  Administered 2017-12-26 (×3): 50 mg via INTRAVENOUS

## 2017-12-26 MED ORDER — PHENYLEPHRINE 40 MCG/ML (10ML) SYRINGE FOR IV PUSH (FOR BLOOD PRESSURE SUPPORT)
PREFILLED_SYRINGE | INTRAVENOUS | Status: AC
  Filled 2017-12-26: qty 10

## 2017-12-26 MED ORDER — SODIUM CHLORIDE 0.9% FLUSH: 10.0000 mL | INTRAVENOUS | Status: DC | PRN

## 2017-12-26 MED ORDER — ASPIRIN EC 325 MG PO TBEC
325.0000 mg | DELAYED_RELEASE_TABLET | Freq: Every day | ORAL | Status: DC
  Administered 2017-12-27: 325 mg via ORAL
  Filled 2017-12-26: qty 1

## 2017-12-26 MED ORDER — MIDAZOLAM HCL 2 MG/2ML IJ SOLN
INTRAMUSCULAR | Status: AC
  Filled 2017-12-26: qty 2

## 2017-12-26 MED ORDER — METOPROLOL TARTRATE 5 MG/5ML IV SOLN: 2.5000 mg | INTRAVENOUS | Status: DC | PRN

## 2017-12-26 MED ORDER — ACETAMINOPHEN 160 MG/5ML PO SOLN: 650.0000 mg | Freq: Once | ORAL | Status: AC

## 2017-12-26 MED ORDER — DEXAMETHASONE SODIUM PHOSPHATE 10 MG/ML IJ SOLN
INTRAMUSCULAR | Status: AC
  Filled 2017-12-26: qty 1

## 2017-12-26 MED ORDER — ROCURONIUM BROMIDE 10 MG/ML (PF) SYRINGE
PREFILLED_SYRINGE | INTRAVENOUS | Status: DC | PRN
  Administered 2017-12-26: 50 mg via INTRAVENOUS
  Administered 2017-12-26: 80 mg via INTRAVENOUS
  Administered 2017-12-26: 20 mg via INTRAVENOUS
  Administered 2017-12-26: 50 mg via INTRAVENOUS

## 2017-12-26 MED ORDER — CHLORHEXIDINE GLUCONATE 4 % EX LIQD: 30.0000 mL | CUTANEOUS | Status: DC

## 2017-12-26 MED ORDER — FENTANYL CITRATE (PF) 250 MCG/5ML IJ SOLN
INTRAMUSCULAR | Status: DC | PRN
  Administered 2017-12-26: 150 ug via INTRAVENOUS
  Administered 2017-12-26: 50 ug via INTRAVENOUS
  Administered 2017-12-26: 150 ug via INTRAVENOUS
  Administered 2017-12-26: 100 ug via INTRAVENOUS
  Administered 2017-12-26: 250 ug via INTRAVENOUS
  Administered 2017-12-26: 100 ug via INTRAVENOUS
  Administered 2017-12-26: 200 ug via INTRAVENOUS
  Administered 2017-12-26: 50 ug via INTRAVENOUS
  Administered 2017-12-26 (×2): 100 ug via INTRAVENOUS
  Administered 2017-12-26: 150 ug via INTRAVENOUS
  Administered 2017-12-26: 100 ug via INTRAVENOUS

## 2017-12-26 MED ORDER — LACTATED RINGERS IV SOLN: INTRAVENOUS | Status: DC

## 2017-12-26 MED ORDER — LACTATED RINGERS IV SOLN
INTRAVENOUS | Status: DC | PRN
  Administered 2017-12-26 (×2): via INTRAVENOUS

## 2017-12-26 MED ORDER — CHLORHEXIDINE GLUCONATE CLOTH 2 % EX PADS
6.0000 | MEDICATED_PAD | Freq: Every day | CUTANEOUS | Status: DC
  Administered 2017-12-27 – 2017-12-28 (×3): 6 via TOPICAL

## 2017-12-26 MED ORDER — FAMOTIDINE IN NACL 20-0.9 MG/50ML-% IV SOLN
20.0000 mg | Freq: Two times a day (BID) | INTRAVENOUS | Status: AC
  Administered 2017-12-26: 20 mg via INTRAVENOUS

## 2017-12-26 MED ORDER — ONDANSETRON HCL 4 MG/2ML IJ SOLN
4.0000 mg | Freq: Four times a day (QID) | INTRAMUSCULAR | Status: DC | PRN
  Administered 2017-12-26: 4 mg via INTRAVENOUS
  Filled 2017-12-26: qty 2

## 2017-12-26 MED ORDER — KETOROLAC TROMETHAMINE 15 MG/ML IJ SOLN
15.0000 mg | Freq: Once | INTRAMUSCULAR | Status: AC
  Administered 2017-12-26: 15 mg via INTRAVENOUS
  Filled 2017-12-26: qty 1

## 2017-12-26 MED ORDER — METOPROLOL TARTRATE 25 MG/10 ML ORAL SUSPENSION: 12.5000 mg | Freq: Two times a day (BID) | ORAL | Status: DC

## 2017-12-26 MED ORDER — PHENYLEPHRINE HCL-NACL 20-0.9 MG/250ML-% IV SOLN
0.0000 ug/min | INTRAVENOUS | Status: DC
  Filled 2017-12-26: qty 250

## 2017-12-26 MED ORDER — ONDANSETRON HCL 4 MG/2ML IJ SOLN
INTRAMUSCULAR | Status: AC
  Filled 2017-12-26: qty 2

## 2017-12-26 MED ORDER — DOPAMINE-DEXTROSE 3.2-5 MG/ML-% IV SOLN: 3.0000 ug/kg/min | INTRAVENOUS | Status: DC

## 2017-12-26 MED ORDER — HEPARIN SODIUM (PORCINE) 1000 UNIT/ML IJ SOLN
INTRAMUSCULAR | Status: DC | PRN
  Administered 2017-12-26: 2000 [IU] via INTRAVENOUS
  Administered 2017-12-26: 24000 [IU] via INTRAVENOUS

## 2017-12-26 MED ORDER — VANCOMYCIN HCL IN DEXTROSE 1-5 GM/200ML-% IV SOLN
1000.0000 mg | Freq: Once | INTRAVENOUS | Status: AC
  Administered 2017-12-26: 1000 mg via INTRAVENOUS
  Filled 2017-12-26: qty 200

## 2017-12-26 MED ORDER — CHLORHEXIDINE GLUCONATE 0.12 % MT SOLN
15.0000 mL | OROMUCOSAL | Status: AC
  Administered 2017-12-26: 15 mL via OROMUCOSAL

## 2017-12-26 MED ORDER — PROPOFOL 10 MG/ML IV BOLUS
INTRAVENOUS | Status: DC | PRN
  Administered 2017-12-26: 10 mg via INTRAVENOUS
  Administered 2017-12-26: 50 mg via INTRAVENOUS
  Administered 2017-12-26 (×2): 10 mg via INTRAVENOUS
  Administered 2017-12-26: 50 mg via INTRAVENOUS
  Administered 2017-12-26: 20 mg via INTRAVENOUS

## 2017-12-26 MED ORDER — SODIUM CHLORIDE 0.9% FLUSH
10.0000 mL | Freq: Two times a day (BID) | INTRAVENOUS | Status: DC
  Administered 2017-12-26: 20 mL
  Administered 2017-12-27 (×2): 10 mL

## 2017-12-26 MED ORDER — BISACODYL 10 MG RE SUPP
10.0000 mg | Freq: Every day | RECTAL | Status: DC
  Filled 2017-12-26: qty 1

## 2017-12-26 MED ORDER — 0.9 % SODIUM CHLORIDE (POUR BTL) OPTIME
TOPICAL | Status: DC | PRN
  Administered 2017-12-26 (×6): 1000 mL

## 2017-12-26 MED ORDER — LACTATED RINGERS IV SOLN
INTRAVENOUS | Status: DC | PRN
  Administered 2017-12-26 (×2): via INTRAVENOUS

## 2017-12-26 MED ORDER — ACETAMINOPHEN 160 MG/5ML PO SOLN: 1000.0000 mg | Freq: Four times a day (QID) | ORAL | Status: DC

## 2017-12-26 MED ORDER — MIDAZOLAM HCL 5 MG/5ML IJ SOLN
INTRAMUSCULAR | Status: DC | PRN
  Administered 2017-12-26: 5 mg via INTRAVENOUS
  Administered 2017-12-26 (×3): 2 mg via INTRAVENOUS
  Administered 2017-12-26: 1 mg via INTRAVENOUS

## 2017-12-26 MED ORDER — MORPHINE SULFATE (PF) 2 MG/ML IV SOLN
2.0000 mg | INTRAVENOUS | Status: DC | PRN
  Administered 2017-12-27: 2 mg via INTRAVENOUS
  Filled 2017-12-26: qty 2

## 2017-12-26 MED ORDER — SODIUM CHLORIDE 0.9 % IV SOLN
INTRAVENOUS | Status: DC | PRN
  Administered 2017-12-26: 50 ug/min via INTRAVENOUS

## 2017-12-26 MED ORDER — CHLORHEXIDINE GLUCONATE 0.12 % MT SOLN
15.0000 mL | Freq: Once | OROMUCOSAL | Status: AC
  Administered 2017-12-26: 15 mL via OROMUCOSAL
  Filled 2017-12-26: qty 15

## 2017-12-26 MED ORDER — ACETAMINOPHEN 650 MG RE SUPP
650.0000 mg | Freq: Once | RECTAL | Status: AC
  Administered 2017-12-26: 650 mg via RECTAL

## 2017-12-26 MED ORDER — ROCURONIUM BROMIDE 10 MG/ML (PF) SYRINGE
PREFILLED_SYRINGE | INTRAVENOUS | Status: AC
  Filled 2017-12-26: qty 10

## 2017-12-26 MED ORDER — ORAL CARE MOUTH RINSE
15.0000 mL | OROMUCOSAL | Status: DC
  Administered 2017-12-26 (×2): 15 mL via OROMUCOSAL

## 2017-12-26 MED ORDER — SODIUM CHLORIDE 0.45 % IV SOLN
INTRAVENOUS | Status: DC | PRN
  Administered 2017-12-26: 15:00:00 via INTRAVENOUS

## 2017-12-26 MED ORDER — CHLORHEXIDINE GLUCONATE 0.12% ORAL RINSE (MEDLINE KIT): 15.0000 mL | Freq: Two times a day (BID) | OROMUCOSAL | Status: DC

## 2017-12-26 MED ORDER — DOCUSATE SODIUM 100 MG PO CAPS
200.0000 mg | ORAL_CAPSULE | Freq: Every day | ORAL | Status: DC
  Administered 2017-12-27 – 2017-12-28 (×2): 200 mg via ORAL
  Filled 2017-12-26 (×4): qty 2

## 2017-12-26 MED ORDER — INSULIN ASPART 100 UNIT/ML ~~LOC~~ SOLN
0.0000 [IU] | SUBCUTANEOUS | Status: DC
  Administered 2017-12-26: 4 [IU] via SUBCUTANEOUS
  Administered 2017-12-26 – 2017-12-27 (×2): 2 [IU] via SUBCUTANEOUS
  Administered 2017-12-27: 4 [IU] via SUBCUTANEOUS

## 2017-12-26 MED ORDER — PHENYLEPHRINE 40 MCG/ML (10ML) SYRINGE FOR IV PUSH (FOR BLOOD PRESSURE SUPPORT)
PREFILLED_SYRINGE | INTRAVENOUS | Status: DC | PRN
  Administered 2017-12-26: 80 ug via INTRAVENOUS
  Administered 2017-12-26 (×5): 40 ug via INTRAVENOUS
  Administered 2017-12-26: 80 ug via INTRAVENOUS
  Administered 2017-12-26: 40 ug via INTRAVENOUS
  Administered 2017-12-26: 80 ug via INTRAVENOUS

## 2017-12-26 MED ORDER — FENTANYL CITRATE (PF) 250 MCG/5ML IJ SOLN
INTRAMUSCULAR | Status: AC
  Filled 2017-12-26: qty 5

## 2017-12-26 MED ORDER — MIDAZOLAM HCL 10 MG/2ML IJ SOLN
INTRAMUSCULAR | Status: AC
  Filled 2017-12-26: qty 2

## 2017-12-26 MED ORDER — ORAL CARE MOUTH RINSE
15.0000 mL | Freq: Two times a day (BID) | OROMUCOSAL | Status: DC
  Administered 2017-12-26 – 2017-12-29 (×6): 15 mL via OROMUCOSAL

## 2017-12-26 MED ORDER — METOCLOPRAMIDE HCL 5 MG/ML IJ SOLN
10.0000 mg | Freq: Four times a day (QID) | INTRAMUSCULAR | Status: DC
  Administered 2017-12-26 – 2017-12-30 (×15): 10 mg via INTRAVENOUS
  Filled 2017-12-26 (×14): qty 2

## 2017-12-26 MED ORDER — ASPIRIN 81 MG PO CHEW: 324.0000 mg | CHEWABLE_TABLET | Freq: Every day | ORAL | Status: DC

## 2017-12-26 MED ORDER — ROCURONIUM BROMIDE 100 MG/10ML IV SOLN: INTRAVENOUS | Status: DC | PRN

## 2017-12-26 MED ORDER — PROPOFOL 10 MG/ML IV BOLUS
INTRAVENOUS | Status: AC
  Filled 2017-12-26: qty 20

## 2017-12-26 MED ORDER — LIDOCAINE 2% (20 MG/ML) 5 ML SYRINGE
INTRAMUSCULAR | Status: AC
  Filled 2017-12-26: qty 5

## 2017-12-26 MED ORDER — INSULIN REGULAR BOLUS VIA INFUSION
0.0000 [IU] | Freq: Three times a day (TID) | INTRAVENOUS | Status: DC
  Filled 2017-12-26: qty 10

## 2017-12-26 MED ORDER — FENTANYL CITRATE (PF) 250 MCG/5ML IJ SOLN
INTRAMUSCULAR | Status: AC
  Filled 2017-12-26: qty 25

## 2017-12-26 MED ORDER — ALBUMIN HUMAN 5 % IV SOLN
250.0000 mL | INTRAVENOUS | Status: AC | PRN
  Administered 2017-12-26 – 2017-12-27 (×2): 250 mL via INTRAVENOUS
  Filled 2017-12-26: qty 250

## 2017-12-26 MED ORDER — POTASSIUM CHLORIDE 10 MEQ/50ML IV SOLN
10.0000 meq | INTRAVENOUS | Status: AC
  Administered 2017-12-26 (×3): 10 meq via INTRAVENOUS

## 2017-12-26 MED ORDER — SODIUM CHLORIDE 0.9 % IV SOLN
INTRAVENOUS | Status: DC
  Filled 2017-12-26: qty 1

## 2017-12-26 MED ORDER — PROTAMINE SULFATE 10 MG/ML IV SOLN
INTRAVENOUS | Status: AC
  Filled 2017-12-26: qty 25

## 2017-12-26 MED ORDER — POTASSIUM CHLORIDE 10 MEQ/50ML IV SOLN
10.0000 meq | Freq: Once | INTRAVENOUS | Status: AC
  Administered 2017-12-26: 10 meq via INTRAVENOUS
  Filled 2017-12-26: qty 50

## 2017-12-26 MED ORDER — DEXMEDETOMIDINE HCL IN NACL 200 MCG/50ML IV SOLN
0.0000 ug/kg/h | INTRAVENOUS | Status: DC
  Administered 2017-12-26: 0.9 ug/kg/h via INTRAVENOUS
  Administered 2017-12-26: 0.5 ug/kg/h via INTRAVENOUS
  Filled 2017-12-26 (×2): qty 50

## 2017-12-26 MED ORDER — ATORVASTATIN CALCIUM 40 MG PO TABS
40.0000 mg | ORAL_TABLET | Freq: Every day | ORAL | Status: DC
  Administered 2017-12-27 – 2017-12-29 (×3): 40 mg via ORAL
  Filled 2017-12-26 (×2): qty 1

## 2017-12-26 MED ORDER — SODIUM CHLORIDE 0.9% FLUSH: 3.0000 mL | INTRAVENOUS | Status: DC | PRN

## 2017-12-26 MED ORDER — LACTATED RINGERS IV SOLN
500.0000 mL | Freq: Once | INTRAVENOUS | Status: AC | PRN
  Administered 2017-12-27: 500 mL via INTRAVENOUS

## 2017-12-26 MED ORDER — SODIUM CHLORIDE 0.9% FLUSH
3.0000 mL | Freq: Two times a day (BID) | INTRAVENOUS | Status: DC
  Administered 2017-12-27 – 2017-12-28 (×3): 3 mL via INTRAVENOUS

## 2017-12-26 MED ORDER — OXYCODONE HCL 5 MG PO TABS: 5.0000 mg | ORAL_TABLET | ORAL | Status: DC | PRN

## 2017-12-26 MED ORDER — TRAMADOL HCL 50 MG PO TABS
50.0000 mg | ORAL_TABLET | ORAL | Status: DC | PRN
  Administered 2017-12-27 – 2017-12-28 (×2): 50 mg via ORAL
  Filled 2017-12-26 (×2): qty 1

## 2017-12-26 MED ORDER — MILRINONE LACTATE IN DEXTROSE 20-5 MG/100ML-% IV SOLN
0.1250 ug/kg/min | INTRAVENOUS | Status: DC
  Administered 2017-12-26: 0.3 ug/kg/min via INTRAVENOUS
  Administered 2017-12-27: 0.25 ug/kg/min via INTRAVENOUS
  Administered 2017-12-27: 0.125 ug/kg/min via INTRAVENOUS
  Filled 2017-12-26 (×2): qty 100

## 2017-12-26 MED ORDER — LACTATED RINGERS IV SOLN
INTRAVENOUS | Status: DC
  Administered 2017-12-26 – 2017-12-27 (×2): via INTRAVENOUS

## 2017-12-26 MED ORDER — METOPROLOL TARTRATE 12.5 MG HALF TABLET
12.5000 mg | ORAL_TABLET | Freq: Two times a day (BID) | ORAL | Status: DC
  Administered 2017-12-27 (×2): 12.5 mg via ORAL
  Filled 2017-12-26 (×2): qty 1

## 2017-12-26 MED ORDER — PANTOPRAZOLE SODIUM 40 MG PO TBEC
40.0000 mg | DELAYED_RELEASE_TABLET | Freq: Every day | ORAL | Status: DC
  Administered 2017-12-28 – 2017-12-29 (×2): 40 mg via ORAL
  Filled 2017-12-26 (×3): qty 1

## 2017-12-26 MED ORDER — NITROGLYCERIN 0.2 MG/ML ON CALL CATH LAB
INTRAVENOUS | Status: DC | PRN
  Administered 2017-12-26: 10 ug via INTRAVENOUS
  Administered 2017-12-26 (×2): 20 ug via INTRAVENOUS

## 2017-12-26 MED ORDER — SODIUM CHLORIDE 0.9 % IV SOLN: 250.0000 mL | INTRAVENOUS | Status: DC

## 2017-12-26 MED ORDER — NITROGLYCERIN IN D5W 200-5 MCG/ML-% IV SOLN
0.0000 ug/min | INTRAVENOUS | Status: DC
  Administered 2017-12-26: 10 ug/min via INTRAVENOUS

## 2017-12-26 MED ORDER — MIDAZOLAM HCL 2 MG/2ML IJ SOLN
2.0000 mg | INTRAMUSCULAR | Status: DC | PRN
  Administered 2017-12-26: 2 mg via INTRAVENOUS
  Filled 2017-12-26: qty 2

## 2017-12-26 MED ORDER — DEXTROSE 50 % IV SOLN
INTRAVENOUS | Status: DC | PRN
  Administered 2017-12-26: 13 mL via INTRAVENOUS

## 2017-12-26 SURGICAL SUPPLY — 110 items
ADAPTER CARDIO PERF ANTE/RETRO (ADAPTER) ×6 IMPLANT
APPLIER CLIP 9.375 SM OPEN (CLIP) ×6
BAG DECANTER FOR FLEXI CONT (MISCELLANEOUS) ×6 IMPLANT
BANDAGE ACE 4X5 VEL STRL LF (GAUZE/BANDAGES/DRESSINGS) ×6 IMPLANT
BANDAGE ACE 6X5 VEL STRL LF (GAUZE/BANDAGES/DRESSINGS) ×6 IMPLANT
BASKET HEART  (ORDER IN 25'S) (MISCELLANEOUS) ×1
BASKET HEART (ORDER IN 25'S) (MISCELLANEOUS) ×1
BASKET HEART (ORDER IN 25S) (MISCELLANEOUS) ×4 IMPLANT
BLADE CLIPPER SURG (BLADE) IMPLANT
BLADE STERNUM SYSTEM 6 (BLADE) ×12 IMPLANT
BLADE SURG 11 STRL SS (BLADE) ×6 IMPLANT
BLADE SURG 12 STRL SS (BLADE) ×12 IMPLANT
BLADE SURG 15 STRL LF DISP TIS (BLADE) ×8 IMPLANT
BLADE SURG 15 STRL SS (BLADE) ×4
BNDG GAUZE ELAST 4 BULKY (GAUZE/BANDAGES/DRESSINGS) ×6 IMPLANT
CANISTER SUCT 3000ML PPV (MISCELLANEOUS) ×6 IMPLANT
CANNULA GUNDRY RCSP 15FR (MISCELLANEOUS) ×6 IMPLANT
CATH CPB KIT VANTRIGT (MISCELLANEOUS) ×6 IMPLANT
CATH ROBINSON RED A/P 18FR (CATHETERS) ×18 IMPLANT
CATH THORACIC 36FR RT ANG (CATHETERS) ×6 IMPLANT
CLIP APPLIE 9.375 SM OPEN (CLIP) ×4 IMPLANT
CLIP VESOCCLUDE MED 24/CT (CLIP) IMPLANT
CLIP VESOCCLUDE SM WIDE 24/CT (CLIP) ×6 IMPLANT
COVER MAYO STAND STRL (DRAPES) ×6 IMPLANT
CRADLE DONUT ADULT HEAD (MISCELLANEOUS) ×6 IMPLANT
DERMABOND ADVANCED (GAUZE/BANDAGES/DRESSINGS) ×4
DERMABOND ADVANCED .7 DNX12 (GAUZE/BANDAGES/DRESSINGS) ×8 IMPLANT
DRAIN CHANNEL 32F RND 10.7 FF (WOUND CARE) ×6 IMPLANT
DRAPE CARDIOVASCULAR INCISE (DRAPES) ×2
DRAPE HALF SHEET 40X57 (DRAPES) ×6 IMPLANT
DRAPE SLUSH/WARMER DISC (DRAPES) ×6 IMPLANT
DRAPE SRG 135X102X78XABS (DRAPES) ×4 IMPLANT
DRSG AQUACEL AG ADV 3.5X14 (GAUZE/BANDAGES/DRESSINGS) ×6 IMPLANT
ELECT BLADE 4.0 EZ CLEAN MEGAD (MISCELLANEOUS) ×6
ELECT BLADE 6.5 EXT (BLADE) ×6 IMPLANT
ELECT CAUTERY BLADE 6.4 (BLADE) ×6 IMPLANT
ELECT REM PT RETURN 9FT ADLT (ELECTROSURGICAL) ×12
ELECTRODE BLDE 4.0 EZ CLN MEGD (MISCELLANEOUS) ×4 IMPLANT
ELECTRODE REM PT RTRN 9FT ADLT (ELECTROSURGICAL) ×8 IMPLANT
FELT TEFLON 1X6 (MISCELLANEOUS) ×12 IMPLANT
GAUZE SPONGE 4X4 12PLY STRL (GAUZE/BANDAGES/DRESSINGS) ×12 IMPLANT
GAUZE SPONGE 4X4 12PLY STRL LF (GAUZE/BANDAGES/DRESSINGS) ×12 IMPLANT
GEL ULTRASOUND 20GR AQUASONIC (MISCELLANEOUS) ×6 IMPLANT
GLOVE BIO SURGEON STRL SZ7.5 (GLOVE) ×18 IMPLANT
GOWN STRL REUS W/ TWL LRG LVL3 (GOWN DISPOSABLE) ×16 IMPLANT
GOWN STRL REUS W/TWL LRG LVL3 (GOWN DISPOSABLE) ×8
HARMONIC SHEARS 14CM COAG (MISCELLANEOUS) ×6 IMPLANT
HEMOSTAT SURGICEL 2X14 (HEMOSTASIS) ×6 IMPLANT
INSERT FOGARTY XLG (MISCELLANEOUS) IMPLANT
KIT BASIN OR (CUSTOM PROCEDURE TRAY) ×6 IMPLANT
KIT SUCTION CATH 14FR (SUCTIONS) ×6 IMPLANT
KIT TURNOVER KIT B (KITS) ×6 IMPLANT
KIT VASOVIEW HEMOPRO VH 3000 (KITS) ×6 IMPLANT
LEAD PACING MYOCARDI (MISCELLANEOUS) ×12 IMPLANT
MARKER GRAFT CORONARY BYPASS (MISCELLANEOUS) ×24 IMPLANT
NS IRRIG 1000ML POUR BTL (IV SOLUTION) ×36 IMPLANT
PACK E OPEN HEART (SUTURE) ×6 IMPLANT
PACK OPEN HEART (CUSTOM PROCEDURE TRAY) ×6 IMPLANT
PAD ARMBOARD 7.5X6 YLW CONV (MISCELLANEOUS) ×12 IMPLANT
PAD ELECT DEFIB RADIOL ZOLL (MISCELLANEOUS) ×6 IMPLANT
PENCIL BUTTON HOLSTER BLD 10FT (ELECTRODE) ×6 IMPLANT
POWDER SURGICEL 3.0 GRAM (HEMOSTASIS) ×12 IMPLANT
PUNCH AORTIC ROTATE 4.0MM (MISCELLANEOUS) IMPLANT
PUNCH AORTIC ROTATE 4.5MM 8IN (MISCELLANEOUS) ×6 IMPLANT
PUNCH AORTIC ROTATE 5MM 8IN (MISCELLANEOUS) IMPLANT
SET CARDIOPLEGIA MPS 5001102 (MISCELLANEOUS) ×6 IMPLANT
SHEARS HARMONIC 9CM CVD (BLADE) ×6 IMPLANT
SOLUTION ANTI FOG 6CC (MISCELLANEOUS) ×6 IMPLANT
SPONGE INTESTINAL PEANUT (DISPOSABLE) ×6 IMPLANT
SPONGE LAP 18X18 X RAY DECT (DISPOSABLE) ×12 IMPLANT
SPONGE LAP 4X18 RFD (DISPOSABLE) ×6 IMPLANT
SUT BONE WAX W31G (SUTURE) ×6 IMPLANT
SUT MNCRL AB 4-0 PS2 18 (SUTURE) ×6 IMPLANT
SUT PROLENE 3 0 SH DA (SUTURE) IMPLANT
SUT PROLENE 3 0 SH1 36 (SUTURE) IMPLANT
SUT PROLENE 4 0 RB 1 (SUTURE) ×8
SUT PROLENE 4 0 SH DA (SUTURE) ×24 IMPLANT
SUT PROLENE 4-0 RB1 .5 CRCL 36 (SUTURE) ×16 IMPLANT
SUT PROLENE 5 0 C 1 36 (SUTURE) IMPLANT
SUT PROLENE 6 0 C 1 30 (SUTURE) ×12 IMPLANT
SUT PROLENE 6 0 CC (SUTURE) ×36 IMPLANT
SUT PROLENE 8 0 BV175 6 (SUTURE) IMPLANT
SUT PROLENE BLUE 7 0 (SUTURE) ×18 IMPLANT
SUT SILK  1 MH (SUTURE)
SUT SILK 1 MH (SUTURE) IMPLANT
SUT SILK 2 0 SH CR/8 (SUTURE) ×6 IMPLANT
SUT SILK 3 0 SH CR/8 (SUTURE) IMPLANT
SUT STEEL 6MS V (SUTURE) ×6 IMPLANT
SUT STEEL SZ 6 DBL 3X14 BALL (SUTURE) ×6 IMPLANT
SUT VIC AB 0 CT1 27 (SUTURE) ×2
SUT VIC AB 0 CT1 27XBRD ANBCTR (SUTURE) ×4 IMPLANT
SUT VIC AB 1 CTX 36 (SUTURE) ×8
SUT VIC AB 1 CTX36XBRD ANBCTR (SUTURE) ×16 IMPLANT
SUT VIC AB 2-0 CT1 27 (SUTURE) ×2
SUT VIC AB 2-0 CT1 TAPERPNT 27 (SUTURE) ×4 IMPLANT
SUT VIC AB 2-0 CTX 27 (SUTURE) IMPLANT
SUT VIC AB 3-0 SH 27 (SUTURE) ×2
SUT VIC AB 3-0 SH 27X BRD (SUTURE) ×4 IMPLANT
SUT VIC AB 3-0 X1 27 (SUTURE) ×12 IMPLANT
SYR 50ML SLIP (SYRINGE) IMPLANT
SYSTEM SAHARA CHEST DRAIN ATS (WOUND CARE) ×6 IMPLANT
TAPE CLOTH SURG 4X10 WHT LF (GAUZE/BANDAGES/DRESSINGS) ×6 IMPLANT
TAPE PAPER 2X10 WHT MICROPORE (GAUZE/BANDAGES/DRESSINGS) ×6 IMPLANT
TOWEL GREEN STERILE (TOWEL DISPOSABLE) ×6 IMPLANT
TOWEL GREEN STERILE FF (TOWEL DISPOSABLE) ×6 IMPLANT
TRAY FOLEY SLVR 16FR TEMP STAT (SET/KITS/TRAYS/PACK) ×6 IMPLANT
TUBE SUCT INTRACARD DLP 20F (MISCELLANEOUS) ×6 IMPLANT
TUBING INSUFFLATION (TUBING) ×6 IMPLANT
UNDERPAD 30X30 (UNDERPADS AND DIAPERS) ×6 IMPLANT
WATER STERILE IRR 1000ML POUR (IV SOLUTION) ×12 IMPLANT

## 2017-12-26 NOTE — Anesthesia Procedure Notes (Signed)
Central Venous Catheter Insertion Performed by: [name] SilvasHollis, Leo Fray D, MD, anesthesiologist Start/End7/16/2019 6:50 AM, 12/26/2017 7:00 AM Patient location: Pre-op. Preanesthetic checklist: patient identified, IV checked, site marked, risks and benefits discussed, surgical consent, monitors and equipment checked, pre-op evaluation, timeout performed and anesthesia consent Position: Trendelenburg Lidocaine 1% used for infiltration and patient sedated Hand hygiene performed , maximum sterile barriers used  and Seldinger technique used Catheter size: 9 Fr Total catheter length 10. Central line was placed.Sheath introducer Swan type:thermodilution PA Cath depth:50 Procedure performed using ultrasound guided technique. Ultrasound Notes:anatomy identified, needle tip was noted to be adjacent to the nerve/plexus identified, no ultrasound evidence of intravascular and/or intraneural injection and image(s) printed for medical record Attempts: 1 Following insertion, line sutured and dressing applied. Post procedure assessment: blood return through all ports, free fluid flow and no air  Patient tolerated the procedure well with no immediate complications.

## 2017-12-26 NOTE — Anesthesia Procedure Notes (Signed)
Procedure Name: Intubation Date/Time: 12/26/2017 7:48 AM Performed by: Adria Dillonkin, Tanai Bouler A, CRNA Pre-anesthesia Checklist: Patient identified, Emergency Drugs available, Suction available and Patient being monitored Patient Re-evaluated:Patient Re-evaluated prior to induction Oxygen Delivery Method: Circle system utilized Preoxygenation: Pre-oxygenation with 100% oxygen Induction Type: IV induction Ventilation: Mask ventilation without difficulty Laryngoscope Size: Miller and 3 Grade View: Grade I Tube type: Oral Tube size: 8.0 mm Number of attempts: 1 Airway Equipment and Method: Stylet Placement Confirmation: ETT inserted through vocal cords under direct vision,  positive ETCO2,  CO2 detector and breath sounds checked- equal and bilateral Secured at: 21 cm Tube secured with: Tape Dental Injury: Teeth and Oropharynx as per pre-operative assessment

## 2017-12-26 NOTE — Brief Op Note (Signed)
12/26/2017  11:33 AM  PATIENT:  [name]  62 y.o. male  PRE-OPERATIVE DIAGNOSIS:  CAD  POST-OPERATIVE DIAGNOSIS:  CAD  PROCEDURE:  Procedure(s):  CORONARY ARTERY BYPASS GRAFTING x 3 -LIMA to LAD -SVG to LEFT CIRCUMFLEX -SVG to PDA  ENDOSCOPIC HARVEST GREATER SAPHENOUS VEIN -Left Thigh to below the knee  TRANSESOPHAGEAL ECHOCARDIOGRAM (TEE) (N/A)  SURGEON:  Surgeon(s) and Role:    Kerin Perna* Van Trigt, Peter, MD - Primary  PHYSICIAN ASSISTANT: Lowella DandyErin Barrett PA-C  ANESTHESIA:   general  EBL: 350 cc  BLOOD ADMINISTERED: CELLSAVER and 2 FFP  DRAINS: Left Pleural Chest Tubes, Mediastinal Chest Drains   LOCAL MEDICATIONS USED:  NONE  SPECIMEN:  No Specimen  DISPOSITION OF SPECIMEN:  N/A  COUNTS:  YES  TOURNIQUET:  * No tourniquets in log *  DICTATION: .Dragon Dictation  PLAN OF CARE: Admit to inpatient   PATIENT DISPOSITION:  ICU - intubated and hemodynamically stable.   Delay start of Pharmacological VTE agent (>24hrs) due to surgical blood loss or risk of bleeding: yes

## 2017-12-26 NOTE — Progress Notes (Signed)
Pre Procedure note for inpatients:   Colin MiresMilton A Balzarini has been scheduled for Procedure(s): CORONARY ARTERY BYPASS GRAFTING (CABG) (N/A) TRANSESOPHAGEAL ECHOCARDIOGRAM (TEE) (N/A) LEFT POSSIBLE RADIAL ARTERY HARVEST (Left) today. The various methods of treatment have been discussed with the patient. After consideration of the risks, benefits and treatment options the patient has consented to the planned procedure.   The patient has been seen and labs reviewed. There are no changes in the patient's condition to prevent proceeding with the planned procedure today.  Recent labs:  Lab Results  Component Value Date   WBC 7.2 12/25/2017   HGB 13.9 12/25/2017   HCT 43.7 12/25/2017   PLT 222 12/25/2017   GLUCOSE 130 (H) 12/25/2017   ALT 21 12/25/2017   AST 17 12/25/2017   NA 140 12/25/2017   K 4.7 12/25/2017   CL 106 12/25/2017   CREATININE 0.96 12/25/2017   BUN 14 12/25/2017   CO2 29 12/25/2017   INR 1.00 12/25/2017   HGBA1C 7.7 (H) 12/25/2017    Mikey BussingPeter Van Trigt III, MD 12/26/2017 7:33 AM

## 2017-12-26 NOTE — Anesthesia Preprocedure Evaluation (Signed)
Anesthesia Evaluation  Patient identified by MRN, date of birth, ID band Patient awake    Reviewed: Allergy & Precautions, NPO status , Patient's Chart, lab work & pertinent test results, reviewed documented beta blocker date and time   History of Anesthesia Complications Negative for: history of anesthetic complications  Airway Mallampati: II  TM Distance: >3 FB Neck ROM: Full    Dental  (+) Dental Advisory Given   Pulmonary neg pulmonary ROS,    breath sounds clear to auscultation       Cardiovascular hypertension, Pt. on medications and Pt. on home beta blockers + CAD and + Past MI   Rhythm:Regular     Neuro/Psych negative neurological ROS  negative psych ROS   GI/Hepatic Neg liver ROS, GERD  ,  Endo/Other  diabetes, Type 2, Insulin Dependent  Renal/GU negative Renal ROS     Musculoskeletal  (+) Arthritis ,   Abdominal   Peds  Hematology   Anesthesia Other Findings   Reproductive/Obstetrics                             Anesthesia Physical Anesthesia Plan  ASA: IV  Anesthesia Plan: General   Post-op Pain Management:    Induction: Intravenous  PONV Risk Score and Plan: 2 and Treatment may vary due to age or medical condition  Airway Management Planned: Oral ETT  Additional Equipment: Arterial line, CVP, PA Cath, TEE and Ultrasound Guidance Line Placement  Intra-op Plan:   Post-operative Plan: Extubation in OR  Informed Consent: I have reviewed the patients History and Physical, chart, labs and discussed the procedure including the risks, benefits and alternatives for the proposed anesthesia with the patient or authorized representative who has indicated his/her understanding and acceptance.   Dental advisory given  Plan Discussed with: CRNA and Surgeon  Anesthesia Plan Comments:         Anesthesia Quick Evaluation

## 2017-12-26 NOTE — Anesthesia Procedure Notes (Signed)
Central Venous Catheter Insertion Performed by: [name] SilvasHollis, Welles Walthall D, MD, anesthesiologist Start/End7/16/2019 7:00 AM, 12/26/2017 7:05 AM Patient location: Pre-op. Preanesthetic checklist: patient identified, IV checked, site marked, risks and benefits discussed, surgical consent, monitors and equipment checked, pre-op evaluation, timeout performed and anesthesia consent Hand hygiene performed  and maximum sterile barriers used  PA cath was placed.Swan type:thermodilution Procedure performed without using ultrasound guided technique. Attempts: 1 Patient tolerated the procedure well with no immediate complications.

## 2017-12-26 NOTE — Procedures (Signed)
Extubation Procedure Note  Patient Details:   Name: [name] DOB: Jan 22, 1956 MRN: [id number]   Airway Documentation:   Patient extubated per protocol. NIF -20 VC 0.8L. Patient had positive cuff leak prior to extubation. Patient has strong cough and able to voice. Placed on 4lpm humidified oxygen. Instructed on incentive spirometry, achieved 1250 cc x 8 Vent end date: 12/26/17 Vent end time: 1830   Evaluation  O2 sats: stable throughout Complications: No apparent complications Patient did tolerate procedure well. Bilateral Breath Sounds: Clear, Diminished   Yes  Suszanne ConnersLaura P Meiah Zamudio 12/26/2017, 6:36 PM

## 2017-12-26 NOTE — Anesthesia Procedure Notes (Signed)
Arterial Line Insertion Start/End7/16/2019 6:40 AM, 12/26/2017 6:47 AM Performed by: CRNA  Patient location: Pre-op. Preanesthetic checklist: patient identified, IV checked, site marked, risks and benefits discussed, surgical consent, monitors and equipment checked, pre-op evaluation, timeout performed and anesthesia consent Lidocaine 1% used for infiltration Right, radial was placed Catheter size: 20 G Hand hygiene performed , maximum sterile barriers used  and Seldinger technique used Allen's test indicative of satisfactory collateral circulation Attempts: 1 Procedure performed without using ultrasound guided technique. Following insertion, dressing applied and Biopatch. Post procedure assessment: normal  Patient tolerated the procedure well with no immediate complications.

## 2017-12-26 NOTE — Progress Notes (Signed)
RT NOTE: Rapid wean protocol initiated 

## 2017-12-26 NOTE — Op Note (Signed)
NAME: [name], Kalonji A. MEDICAL RECORD GU:[id number] ACCOUNT [id number] DATE OF BIRTH:11/13/1955 FACILITY: [facility] LOCATION: [facility] PHYSICIAN:PETER VAN TRIGT III, MD  OPERATIVE REPORT  DATE OF PROCEDURE:  12/26/2017  PREOPERATIVE DIAGNOSES:   1.  Severe 3-vessel coronary artery disease with positive stress test and mild left ventricular dysfunction. 2.  Type 2 diabetes.  POSTOPERATIVE DIAGNOSES:   1.  Severe 3-vessel coronary artery disease with positive stress test and mild left ventricular dysfunction. 2.  Type 2 diabetes.  PROCEDURES PERFORMED: 1.  Coronary artery bypass grafting x3 (left internal mammary artery to left anterior descending, saphenous vein graft to obtuse marginal, saphenous vein graft to posterior descending. 2.  Endoscopic harvest of left leg greater saphenous vein.  SURGEON:  Kerin PernaPeter Van Trigt, III, MD.  ASSISTANLowella Dandy:  Erin Barrett, PA-C.    ANESTHESIA:  General, Dr. Corky Soxhris Moser.  CLINICAL NOTE:  The patient is a 62 year old nonsmoker with suboptimally controlled diabetes and symptoms of dyspnea on exertion and a positive stress test.  He recently underwent outpatient cardiac catheterization by Dr. Katrinka BlazingSmith who demonstrated severe  3-vessel coronary disease with total occlusion of the RCA, high-grade proximal LAD stenosis and a high-grade circumflex stenosis.  Ejection fraction was 35-40%.  Because of his diabetes and 3-vessel disease and reduced LV function, he was felt to be a  candidate for surgical coronary revascularization.    I examined the patient in consultation in the office after reviewing his echo and coronary angiogram studies.  I discussed the procedure of CABG in detail with the patient and his wife.  I recommended that CABG as his best long-term therapy of his heart  disease with expected benefits of improved survival, preservation of LV function, and improved symptoms.  I discussed with the patient the risks to him of CABG including the risks of stroke,  bleeding, blood transfusion, postoperative pulmonary problems  including pleural effusion, postoperative infection, postoperative organ failure, and postoperative death.  After reviewing these issues, he demonstrated his understanding and agreed to proceed with surgery in what I felt was informed consent.  OPERATIVE FINDINGS: 1.  The patient's saphenous vein was not optimal, but adequate with some areas of mild varicosities and vessel wall thickening.   2.  The mammary artery was a good conduit.  No packed cell transfusions required.   3.  Global LV function was improved after separation from cardiopulmonary bypass.  DESCRIPTION OF PROCEDURE:  The patient was brought to the operating room and placed supine on the operating table.  General anesthesia was induced under invasive hemodynamic monitoring.  The chest, abdomen and legs were prepped with Betadine, draped as a  sterile field.  A proper time-out was performed.  A sternal incision was made as the saphenous vein was harvested endoscopically from the left leg.  The left internal mammary artery was harvested as a pedicle graft from its origin at the subclavian  vessels.  It was a good vessel with excellent flow.  It was 1.5 mm in diameter.  The sternal retractor was placed and the pericardium opened and suspended.  Pursestrings were placed in the ascending aorta and right atrium and after heparin was  administered and ACT was documented as being therapeutic the patient was cannulated and placed on cardiopulmonary bypass.  The coronaries were identified for grafting.  The RCA was somewhat atretic from being chronically occluded and the inferior wall of  the heart appeared to be somewhat scarred.  Cardioplegia cannulas were placed both antegrade and retrograde with cold blood cardioplegia and  the patient was cooled to 32 degrees.  The mammary artery and vein grafts were prepared for the distal  anastomoses and the Quick-Cross clamp was applied.  One  liter of cold blood cardioplegia was delivered in split doses between the antegrade aortic and retrograde coronary sinus catheters.  There was good cardioplegic arrest and supple temperature dropped  less than 12 degrees.  Cardioplegia was delivered every 20 minutes.  The distal coronary anastomoses were performed.  The first distal anastomosis was the posterior descending branch of the RCA.  This was chronically occluded proximally.  The vessel was 1.5 mm.  A reverse saphenous vein was sewn end-to-side with running  7-0 Prolene with good flow through the graft.  Cardioplegia was redosed.  The second distal anastomosis was the obtuse marginal.  This was a 1.7 mm vessel, proximal 90% stenosis.  Reverse saphenous vein was sewn end-to-side with running 7-0 Prolene with good flow through the graft.  Cardioplegia was redosed.  The third distal anastomosis was the mid to distal LAD.  There was a proximal 95% stenosis.  The left IMA pedicle was brought through an opening in the left lateral pericardium and was brought down onto the LAD and sewn end-to-side with running 8-0  Prolene.  There was good flow through the anastomosis after briefly releasing the pedicle bulldog and the mammary artery.  The bulldog was reapplied, and the pedicle secured to the epicardium.  Cardioplegia was redosed.  The crossclamp was still in place proximal vein anastomoses were performed on the ascending aorta using a 4.5 mm punch and running 6-0 Prolene.  Prior to tying down the final proximal anastomosis, air was vented from the coronaries with a dose of  retrograde warm blood cardioplegia.  The crossclamp was removed.  The heart resumed a spontaneous rhythm.  The vein grafts were deaired and opened and each had good flow.  Hemostasis was documented at the proximal and distal anastomoses.  The patient was rewarmed and reperfused.  Temporary pacing wires were applied.   The lungs were expanded and the cardioplegia cannula was  removed.  The patient was then weaned off cardiopulmonary bypass without difficulty on low-dose milrinone.  Echo showed improvement in global LV function following 3 vessel grafting.  Protamine was  administered without adverse reaction.  The cannula was removed.  There is still some coagulopathy noted despite hemostatic anastomoses and cannulation sites.  The patient was given FFP transfusion with improved coagulation function.  The superior pericardial fat was closed over the aorta and an anterior mediastinal, left pleural chest tubes were placed and brought out through separate incisions.  The sternum was closed with a wire.  The pectoralis fascia was closed with a running #1  Vicryl.  Subcutaneous and skin layers were closed with running Vicryl and sterile dressings were applied.    Total cardiopulmonary bypass time was 100 minutes.  AN/NUANCE  D:12/26/2017 T:12/26/2017 JOB:[id number]

## 2017-12-26 NOTE — Transfer of Care (Signed)
Immediate Anesthesia Transfer of Care Note  Patient: Colin MiresMilton A Talamo  Procedure(s) Performed: CORONARY ARTERY BYPASS GRAFTING (CABG) x 3 Using Left Internal Mammary Artery (LIMA) and Edoscopically Harvested Left Leg Greater Saphenous Vein Graft (SVG); -LIMA to LAD, -SVG to LEFT CIRCUMFLEX, -SVG to PDA, (N/A Chest) TRANSESOPHAGEAL ECHOCARDIOGRAM (TEE) (N/A ) ENDOVEIN HARVEST OF GREATER SAPHENOUS VEIN: Left Thigh to below the knee (Left )  Patient Location: SICU  Anesthesia Type:General  Level of Consciousness: sedated, unresponsive and Patient remains intubated per anesthesia plan  Airway & Oxygen Therapy: Patient remains intubated per anesthesia plan and Patient placed on Ventilator (see vital sign flow sheet for setting)  Post-op Assessment: Report given to RN and Post -op Vital signs reviewed and stable  Post vital signs: Reviewed and stable  Last Vitals:  Vitals Value Taken Time  BP    Temp 35.1 C 12/26/2017  1:38 PM  Pulse    Resp 12 12/26/2017  1:38 PM  SpO2    Vitals shown include unvalidated device data.  Last Pain:  Vitals:   12/26/17 0600  TempSrc:   PainSc: 0-No pain      Patients Stated Pain Goal: 3 (12/26/17 0600)  Complications: No apparent anesthesia complications

## 2017-12-26 NOTE — Progress Notes (Signed)
CT surgery p.m. Rounds  Doing well after multivessel CABG Extubated with stable hemodynamics Off insulin infusion Continue postop care

## 2017-12-26 NOTE — H&P (Signed)
PCP is [name], MD Referring Provider is No ref. provider found  No chief complaint on file.   HPI: Patient examined, coronary arteriogram images personally reviewed and counseled with patient.  62 year old insulin-dependent non-smoker with positive family history of CAD-CABG presents for discussion of surgical coronary revascularization.  The patient was noted to have EKG changes on a annual physical.  He underwent a stress test which was abnormal.  He then underwent left heart cath earlier this week by Dr. Katrinka Blazing which demonstrated chronic occlusion of the RCA, high-grade stenosis of the circumflex and moderate disease of the LAD.  Ejection fraction approximately 50%.  Because of his diabetes, three-vessel disease, and the occurrence of an asymptomatic MI he was felt to be a candidate for surgical coronary rationalization.  Patient was prescribed metoprolol 25 twice daily and Lipitor 40 mg by Dr. Katrinka Blazing.  He also takes aspirin Januvia and previously took metformin.  Patient has history of varicose veins but no DVT.  No history of TIA.  No history of claudication.  Patient has neuropathy of his legs from diabetes but no retinopathy or  or nephropathy but he states he does have some protein in his urine.  Patient has had previous cataract surgery and vasa ectomy without problems with bleeding or anesthesia.  Past Medical History:  Diagnosis Date  . Arthritis   . Coronary artery disease   . Diabetes mellitus without complication (HCC)    Type II  . GERD (gastroesophageal reflux disease)   . Myocardial infarction [facility])    "Silent"  . Neuropathy     Past Surgical History:  Procedure Laterality Date  . CATARACT EXTRACTION Bilateral   . COLONOSCOPY W/ POLYPECTOMY    . INTRAVASCULAR PRESSURE WIRE/FFR STUDY N/A 12/19/2017   Procedure: INTRAVASCULAR PRESSURE WIRE/FFR STUDY;  Surgeon: Lyn Records, MD;  Location: [facility] INVASIVE CV [facility];  Service: Cardiovascular;  Laterality: N/A;  . LEFT HEART  CATH AND CORONARY ANGIOGRAPHY N/A 12/19/2017   Procedure: LEFT HEART CATH AND CORONARY ANGIOGRAPHY;  Surgeon: Lyn Records, MD;  Location: [facility] INVASIVE CV LAB;  Service: Cardiovascular;  Laterality: N/A;  . VASECTOMY      Family History  Problem Relation Age of Onset  . Diabetes Mother   . Diabetes Father   . Heart disease Father   . Diabetes Daughter     Social History Social History   Tobacco Use  . Smoking status: Never Smoker  . Smokeless tobacco: Never Used  Substance Use Topics  . Alcohol use: Not Currently    Comment: occasional- 1 beer every 3 monthly  . Drug use: Not on file    Current Facility-Administered Medications  Medication Dose Route Frequency Provider Last Rate Last Dose  . cefUROXime (ZINACEF) 1.5 g in sodium chloride 0.9 % 100 mL IVPB  1.5 g Intravenous To OR Donata Clay, Theron Arista, MD      . cefUROXime (ZINACEF) 750 mg in sodium chloride 0.9 % 100 mL IVPB  750 mg Intravenous To OR Donata Clay, Theron Arista, MD      . chlorhexidine (HIBICLENS) 4 % liquid 2 application  30 mL Topical UD Donata Clay, Theron Arista, MD      . dexmedetomidine (PRECEDEX) 400 MCG/100ML (4 mcg/mL) infusion  0.1-0.7 mcg/kg/hr Intravenous To OR Donata Clay, Theron Arista, MD      . DOPamine (INTROPIN) 800 mg in dextrose 5 % 250 mL (3.2 mg/mL) infusion  0-10 mcg/kg/min Intravenous To OR Kerin Perna, MD      . EPINEPHrine (ADRENALIN)  4 mg in dextrose 5 % 250 mL (0.016 mg/mL) infusion  0-10 mcg/min Intravenous To OR Donata Clay, Theron Arista, MD      . heparin 2,500 Units, papaverine 30 mg in electrolyte-148 (PLASMALYTE-148) 500 mL irrigation   Irrigation To OR Donata Clay, Theron Arista, MD      . heparin 30,000 units/NS 1000 mL solution for CELLSAVER   Other To OR Donata Clay, Theron Arista, MD      . insulin regular (NOVOLIN R,HUMULIN R) 100 Units in sodium chloride 0.9 % 100 mL (1 Units/mL) infusion   Intravenous To OR Donata Clay, Theron Arista, MD      . magnesium sulfate (IV Push/IM) injection 40 mEq  40 mEq Other To OR Donata Clay, Theron Arista, MD      .  milrinone (PRIMACOR) 20 MG/100 ML (0.2 mg/mL) infusion  0.125 mcg/kg/min Intravenous To OR Donata Clay, Theron Arista, MD      . nitroGLYCERIN 50 mg in dextrose 5 % 250 mL (0.2 mg/mL) infusion  2-200 mcg/min Intravenous To OR Donata Clay, Theron Arista, MD      . phenylephrine (NEO-SYNEPHRINE) 20 mg in sodium chloride 0.9 % 250 mL (0.08 mg/mL) infusion  30-200 mcg/min Intravenous To OR Donata Clay, Theron Arista, MD      . potassium chloride injection 80 mEq  80 mEq Other To OR Donata Clay, Theron Arista, MD      . tranexamic acid (CYKLOKAPRON) 2,500 mg in sodium chloride 0.9 % 250 mL (10 mg/mL) infusion  1.5 mg/kg/hr Intravenous To OR Donata Clay, Theron Arista, MD      . tranexamic acid (CYKLOKAPRON) bolus via infusion - over 30 minutes 1,272 mg  15 mg/kg Intravenous To OR Donata Clay, Theron Arista, MD      . tranexamic acid (CYKLOKAPRON) pump prime solution 170 mg  2 mg/kg Intracatheter To OR Donata Clay, Theron Arista, MD      . vancomycin (VANCOCIN) 1,500 mg in sodium chloride 0.9 % 250 mL IVPB  1,500 mg Intravenous To OR Kerin Perna, MD       Facility-Administered Medications Ordered in Other Encounters  Medication Dose Route Frequency Provider Last Rate Last Dose  . fentaNYL (SUBLIMAZE) injection   Intravenous Anesthesia Intra-op Adria Dill, CRNA   50 mcg at 12/26/17 [phone]  . lactated ringers infusion    Continuous PRN Adria Dill, CRNA      . midazolam (VERSED) 5 MG/5ML injection    Anesthesia Intra-op Adria Dill, CRNA   1 mg at 12/26/17 0650    Allergies  Allergen Reactions  . Codeine Sulfate [Codeine]     UNSPECIFIED REACTION     Review of Systems       Right-hand-dominant No thoracic injuries or procedures, no pneumothorax Positive for GERD symptoms, previous normal colonoscopy last year, varicose veins both legs left greater than right             Review of Systems :  [ y ] = yes, [  ] = no        General :  Weight gain [   ]    Weight loss  [4 pounds]  Fatigue [yes]  Fever [  ]  Chills  [  ]                                           HEENT    Headache [  ]  Dizziness [  ]  Blurred vision [  ] Glaucoma  [  ]                          Nosebleeds [  ] Painful or loose teeth [  ]        Cardiac :  Chest pain/ pressure [  ]  Resting SOB [  ] exertional SOB [  ]                        Orthopnea [  ]  Pedal edema  [  ]  Palpitations [  ] Syncope/presyncope [ ]                         Paroxysmal nocturnal dyspnea [  ]         Pulmonary : cough [  ]  wheezing [  ]  Hemoptysis [  ] Sputum [  ] Snoring [  ]                              Pneumothorax [  ]  Sleep apnea [  ]        GI : Vomiting [  ]  Dysphagia [  ]  Melena  [  ]  Abdominal pain [  ] BRBPR [  ]              Heart burn [  ]  Constipation [  ] Diarrhea  [  ] Colonoscopy [   ]        GU : Hematuria [  ]  Dysuria [  ]  Nocturia [  ] UTI's [  ]        Vascular : Claudication [  ]  Rest pain [  ]  DVT [  ] Vein stripping [  ] leg ulcers [  ]                          TIA [  ] Stroke [  ]  Varicose veins [  ]        NEURO :  Headaches  [  ] Seizures [  ] Vision changes [  ] Paresthesias [  ]                                               Musculoskeletal :  Arthritis [  ] Gout  [  ]  Back pain [  ]  Joint pain [  ]        Skin :  Rash [  ]  Melanoma [  ] Sores [  ]        Heme : Bleeding problems [  ]Clotting Disorders [  ] Anemia [  ]Blood Transfusion [ ]         Endocrine : Diabetes [  ] Heat or Cold intolerance [  ] Polyuria [  ]excessive thirst [ ]         Psych : Depression [  ]  Anxiety [  ]  Psych hospitalizations [  ] Memory change [  ]  BP (!) 170/95   Pulse 65   Temp 98.5 F (36.9 C) (Oral)   Resp 11   Ht 6\' 2"  (1.88 m)   Wt 186 lb 14.4 oz (84.8 kg)   SpO2 100%   BMI 24.00 kg/m  Physical Exam     Physical Exam  General: Well-developed middle-aged white male no acute distress accompanied by wife HEENT: Normocephalic pupils equal , dentition adequate Neck:  Supple without JVD, adenopathy, or bruit Chest: Clear to auscultation, symmetrical breath sounds, no rhonchi, no tenderness             or deformity Cardiovascular: Regular rate and rhythm, no murmur, no gallop, peripheral pulses             palpable in all extremities Abdomen:  Soft, nontender, no palpable mass or organomegaly Extremities: Warm, well-perfused, no clubbing cyanosis edema or tenderness, bilateral superficial varicose veins with a small .skin ulcer on the medial aspect of his lower left leg              Rectal/GU: Deferred Neuro: Grossly non--focal and symmetrical throughout Skin: Clean and dry without rash, small ulcer on left lower leg   Diagnostic Tests: Coronary urogram show chronic occlusion of the RCA with reconstitution of the posterior descending via collaterals, high-grade greater than 80% stenosis of the large circumflex marginal and 75% stenosis of the LAD.  EF 50 to 55%.  Impression: Significant three-vessel diabetic disease, previous asymptomatic MI, positive stress test.  Patient would benefit from surgical CABG. He understands his benefits to include improved expected survival, reduce risk of recurrent MI, and preservation of LV function. Plan: Patient has been prepared for surgery on Tuesday, July 16 at [facility].  His vein mapping is completed.  We will be  Prepared to harvest left radial artery if vein conduit is not adequate.  I discussed the procedure in detail with the patient is wife including indication benefits and alternatives.  He understands the risks of stroke, bleeding, blood transfusion, infection, organ failure, and death.  He agrees to proceed with surgery.   Mikey BussingPeter Van Trigt III, MD [facility] [phone]

## 2017-12-27 ENCOUNTER — Encounter (HOSPITAL_COMMUNITY): Payer: Self-pay | Admitting: Cardiothoracic Surgery

## 2017-12-27 ENCOUNTER — Inpatient Hospital Stay (HOSPITAL_COMMUNITY): Payer: BLUE CROSS/BLUE SHIELD

## 2017-12-27 LAB — CREATININE, SERUM
Creatinine, Ser: 1.07 mg/dL (ref 0.61–1.24)
GFR calc Af Amer: >60 mL/min (ref >60)

## 2017-12-27 LAB — BPAM FFP
Blood Product Expiration Date: 201907202359
Blood Product Expiration Date: 201907202359
ISSUE DATE / TIME: 201907161110
ISSUE DATE / TIME: 201907161110
Unit Type and Rh: 6200
Unit Type and Rh: 6200

## 2017-12-27 LAB — POCT I-STAT, CHEM 8
BUN: 22 mg/dL — ABNORMAL HIGH (ref 6–20)
Calcium, Ion: 1.14 mmol/L — ABNORMAL LOW (ref 1.15–1.40)
Chloride: 101 mmol/L (ref 98–111)
Creatinine, Ser: 1 mg/dL (ref 0.61–1.24)
Glucose, Bld: 245 mg/dL — ABNORMAL HIGH (ref 70–99)
HCT: 28 % — ABNORMAL LOW (ref 39.0–52.0)
Hemoglobin: 9.5 g/dL — ABNORMAL LOW (ref 13.0–17.0)
Potassium: 4.3 mmol/L (ref 3.5–5.1)
Sodium: 135 mmol/L (ref 135–145)
TCO2: 22 mmol/L (ref 22–32)

## 2017-12-27 LAB — CBC
HCT: 28.8 % — ABNORMAL LOW (ref 39.0–52.0)
HCT: 30.2 % — ABNORMAL LOW (ref 39.0–52.0)
HEMOGLOBIN: 9.7 g/dL — AB (ref 13.0–17.0)
Hemoglobin: 9.7 g/dL — ABNORMAL LOW (ref 13.0–17.0)
MCH: 31 pg (ref 26.0–34.0)
MCH: 30.5 pg (ref 26.0–34.0)
MCHC: 33.7 g/dL (ref 30.0–36.0)
MCHC: 32.1 g/dL (ref 30.0–36.0)
MCV: 92 fL (ref 78.0–100.0)
MCV: 95 fL (ref 78.0–100.0)
Platelets: 120 10*3/uL — ABNORMAL LOW (ref 150–400)
Platelets: 138 10*3/uL — ABNORMAL LOW (ref 150–400)
RBC: 3.13 MIL/uL — AB (ref 4.22–5.81)
RBC: 3.18 MIL/uL — AB (ref 4.22–5.81)
RDW: 13.3 % (ref 11.5–15.5)
RDW: 13.5 % (ref 11.5–15.5)
WBC: 11.3 10*3/uL — AB (ref 4.0–10.5)
WBC: 14.9 10*3/uL — AB (ref 4.0–10.5)

## 2017-12-27 LAB — PREPARE FRESH FROZEN PLASMA
Unit division: 0
Unit division: 0

## 2017-12-27 LAB — GLUCOSE, CAPILLARY
Glucose-Capillary: 159 mg/dL — ABNORMAL HIGH (ref 70–99)
Glucose-Capillary: 197 mg/dL — ABNORMAL HIGH (ref 70–99)
Glucose-Capillary: 162 mg/dL — ABNORMAL HIGH (ref 70–99)
Glucose-Capillary: 137 mg/dL — ABNORMAL HIGH (ref 70–99)
Glucose-Capillary: 244 mg/dL — ABNORMAL HIGH (ref 70–99)
Glucose-Capillary: 181 mg/dL — ABNORMAL HIGH (ref 70–99)
Glucose-Capillary: 177 mg/dL — ABNORMAL HIGH (ref 70–99)

## 2017-12-27 LAB — BASIC METABOLIC PANEL
Anion gap: 5 (ref 5–15)
BUN: 17 mg/dL (ref 6–20)
CHLORIDE: 109 mmol/L (ref 98–111)
CO2: 22 mmol/L (ref 22–32)
Calcium: 7.5 mg/dL — ABNORMAL LOW (ref 8.9–10.3)
Creatinine, Ser: 1.01 mg/dL (ref 0.61–1.24)
GFR calc Af Amer: >60 mL/min (ref >60)
GFR calc non Af Amer: >60 mL/min (ref >60)
GLUCOSE: 187 mg/dL — AB (ref 70–99)
POTASSIUM: 4.3 mmol/L (ref 3.5–5.1)
SODIUM: 136 mmol/L (ref 135–145)

## 2017-12-27 LAB — TYPE AND SCREEN
ABO/RH(D): A POS
Antibody Screen: NEGATIVE

## 2017-12-27 LAB — MAGNESIUM
Magnesium: 2.4 mg/dL (ref 1.7–2.4)
Magnesium: 2.5 mg/dL — ABNORMAL HIGH (ref 1.7–2.4)

## 2017-12-27 MED ORDER — INSULIN GLARGINE 100 UNIT/ML ~~LOC~~ SOLN
24.0000 [IU] | Freq: Every day | SUBCUTANEOUS | Status: DC
  Administered 2017-12-27 – 2017-12-28 (×2): 24 [IU] via SUBCUTANEOUS
  Filled 2017-12-27 (×3): qty 0.24

## 2017-12-27 MED ORDER — INSULIN ASPART 100 UNIT/ML ~~LOC~~ SOLN
0.0000 [IU] | SUBCUTANEOUS | Status: DC
  Administered 2017-12-27 (×2): 8 [IU] via SUBCUTANEOUS
  Administered 2017-12-27 – 2017-12-28 (×2): 4 [IU] via SUBCUTANEOUS

## 2017-12-27 MED ORDER — FUROSEMIDE 10 MG/ML IJ SOLN
20.0000 mg | Freq: Two times a day (BID) | INTRAMUSCULAR | Status: DC
  Administered 2017-12-27 (×2): 20 mg via INTRAVENOUS
  Filled 2017-12-27 (×2): qty 2

## 2017-12-27 MED ORDER — MORPHINE SULFATE (PF) 2 MG/ML IV SOLN
1.0000 mg | INTRAVENOUS | Status: AC | PRN
Start: 2017-12-27 — End: 2017-12-27
  Filled 2017-12-27: qty 1

## 2017-12-27 MED FILL — Sodium Bicarbonate IV Soln 8.4%: INTRAVENOUS | Qty: 50 | Status: AC

## 2017-12-27 MED FILL — Heparin Sodium (Porcine) Inj 1000 Unit/ML: INTRAMUSCULAR | Qty: 30 | Status: AC

## 2017-12-27 MED FILL — Heparin Sodium (Porcine) Inj 1000 Unit/ML: INTRAMUSCULAR | Qty: 10 | Status: AC

## 2017-12-27 MED FILL — Potassium Chloride Inj 2 mEq/ML: INTRAVENOUS | Qty: 40 | Status: AC

## 2017-12-27 MED FILL — Sodium Chloride IV Soln 0.9%: INTRAVENOUS | Qty: 2000 | Status: AC

## 2017-12-27 MED FILL — Electrolyte-R (PH 7.4) Solution: INTRAVENOUS | Qty: 4000 | Status: AC

## 2017-12-27 MED FILL — Magnesium Sulfate Inj 50%: INTRAMUSCULAR | Qty: 10 | Status: AC

## 2017-12-27 MED FILL — Mannitol IV Soln 20%: INTRAVENOUS | Qty: 500 | Status: AC

## 2017-12-27 MED FILL — Lidocaine HCl(Cardiac) IV PF Soln Pref Syr 100 MG/5ML (2%): INTRAVENOUS | Qty: 5 | Status: AC

## 2017-12-27 NOTE — Plan of Care (Signed)
  Problem: Education: Goal: Knowledge of General Education information will improve Outcome: Progressing   Problem: Health Behavior/Discharge Planning: Goal: Ability to manage health-related needs will improve Outcome: Progressing   Problem: Clinical Measurements: Goal: Ability to maintain clinical measurements within normal limits will improve Outcome: Progressing Goal: Will remain free from infection Outcome: Progressing Goal: Diagnostic test results will improve Outcome: Progressing Goal: Respiratory complications will improve Outcome: Progressing Goal: Cardiovascular complication will be avoided Outcome: Progressing   Problem: Activity: Goal: Risk for activity intolerance will decrease Outcome: Progressing   Problem: Nutrition: Goal: Adequate nutrition will be maintained Outcome: Progressing   Problem: Coping: Goal: Level of anxiety will decrease Outcome: Progressing   Problem: Elimination: Goal: Will not experience complications related to bowel motility Outcome: Progressing Goal: Will not experience complications related to urinary retention Outcome: Progressing   Problem: Pain Managment: Goal: General experience of comfort will improve Outcome: Progressing   Problem: Safety: Goal: Ability to remain free from injury will improve Outcome: Progressing   Problem: Skin Integrity: Goal: Risk for impaired skin integrity will decrease Outcome: Progressing   Problem: Activity: Goal: Risk for activity intolerance will decrease Outcome: Progressing

## 2017-12-27 NOTE — Discharge Summary (Signed)
Physician Discharge Summary  Patient ID: [name] MRN: [id number] DOB/AGE: 1955-09-18 62 y.o.  Admit date: 12/26/2017 Discharge date: 12/30/2017  Admission Diagnoses:  Patient Active Problem List   Diagnosis Date Noted  . Coronary artery disease 12/11/2017  . GERD (gastroesophageal reflux disease) 12/04/2017  . Controlled type 2 diabetes mellitus with neuropathy (HCC) 12/04/2017  . Hypercholesterolemia 12/04/2017  . Abnormal EKG 12/04/2017  . Hypertension 12/04/2017   Discharge Diagnoses:   Patient Active Problem List   Diagnosis Date Noted  . S/P CABG x 3 12/26/2017  . Coronary artery disease 12/11/2017  . GERD (gastroesophageal reflux disease) 12/04/2017  . Controlled type 2 diabetes mellitus with neuropathy (HCC) 12/04/2017  . Hypercholesterolemia 12/04/2017  . Abnormal EKG 12/04/2017  . Hypertension 12/04/2017   Discharged Condition: good  History of Present Illness:  [name] is a 62 yo male with known history of nicotine abuse, insulin dependent DM, hypercholesterolemia, and HTN.  He presented to his PCP for his annual physical.  EKG was obtained and showed changes.  This lead to the patient undergoing a stress test which was abnormal.  He subsequently underwent a heart catheterization which showed multivessel CAD with an EF of 50%.  It was felt coronary bypass grafting would be indicated and the patient was referred to [facility] for surgical evaluation.  The patient was evaluated by Dr. Donata ClayVan Trigt who was in agreement the patient would benefit from coronary bypass.  The risks and benefits of the procedure were explained to the patient and he was agreeable to proceed.    Hospital Course:   [name] presented to [facility] on 12/26/2017.  He was taken to the operating room and underwent CABG x 3 utilizing LIMA to LAD, SVG to Left Circumflex, and SVG to PDA.  He also underwent endoscopic harvest of greater saphenous vein from his left leg.  He tolerated the  procedure without difficulty and was taken to the SICU in stable condition.  He was extubated the evening of surgery.  During his stay in the SICU the patient was weaned off Milrinone, Neo-synephrine, and NTG as tolerated.  His chest tubes and arterial lines were removed without difficulty.  He was started on lasix for hypervolemia.  He was maintaining NSR and was stable for transfer to the telemetry unit on 12/28/2017.  He continues to make progress.  He continues to maintain NSR.  His pacing wires have been removed without difficulty.  He has been started on low dose Lisinopril for better BP control.  He is ambulating independently.  His incisions are healing without evidence of infection.  He is medically stable for discharge home today.    Significant Diagnostic Studies: angiography:    Severe three-vessel CAD with heavy calcification within the left coronary system.    Chronic total occlusion of the mid to distal RCA.  Feels late by left to right collaterals  Normal left main.  Diffuse 50 to 70% stenosis from proximal to mid LAD with FFR across the segment of 0.63.  The first and second diagonals contain 70% ostial stenoses.  A diagonals a small and probably not graftable.  Severe obstruction in the mid circumflex, 90%.  It obtuse marginal also contains 75% obstruction.  Mild reduction in LV function with EF 45 to 50%.  Inferobasal and mid to distal anterior wall hypokinesis.  Normal LVEDP  Treatments: surgery:   1.  Coronary artery bypass grafting x3 (left internal mammary artery to left anterior descending, saphenous vein graft  to obtuse marginal, saphenous vein graft to posterior descending. 2.  Endoscopic harvest of left leg greater saphenous vein.  Discharge Exam: Blood pressure 133/82, pulse 89, temperature 98.7 F (37.1 C), temperature source Oral, resp. rate 20, height 6\' 2"  (1.88 m), weight 188 lb 0.8 oz (85.3 kg), SpO2 95 %.  Gen: no apparent distress Heart; RRR Lungs: CTA  bilaterally Ext: minimal edema Incisions: healing well, no erythema or drainage present  Disposition: Home  Discharge Medications:  The patient has been discharged on:   1.Beta Blocker:  Yes [ x  ]                              No   [   ]                              If No, reason:  2.Ace Inhibitor/ARB: Yes [ x  ]                                     No  [    ]                                     If No, reason:  3.Statin:   Yes [ x  ]                  No  [   ]                  If No, reason:  4.Ecasa:  Yes  [ x  ]                  No   [   ]                  If No, reason:      Allergies as of 12/30/2017      Reactions   Codeine Sulfate [codeine]    UNSPECIFIED REACTION       Medication List    TAKE these medications   acetaminophen 500 MG tablet Commonly known as:  TYLENOL Take 2 tablets (1,000 mg total) by mouth every 6 (six) hours as needed for mild pain or fever.   aspirin EC 81 MG tablet Take 81 mg by mouth daily.   atorvastatin 40 MG tablet Commonly known as:  LIPITOR Take 1 tablet (40 mg total) by mouth daily at 6 PM.   calcium carbonate 500 MG chewable tablet Commonly known as:  TUMS - dosed in mg elemental calcium Chew 1-2 tablets by mouth daily as needed for indigestion or heartburn.   clopidogrel 75 MG tablet Commonly known as:  PLAVIX Take 1 tablet (75 mg total) by mouth daily.   furosemide 40 MG tablet Commonly known as:  LASIX Take 1 tablet (40 mg total) by mouth daily. For 7 days Start taking on:  12/31/2017   LEVEMIR 100 UNIT/ML injection Generic drug:  insulin detemir Inject 25 Units into the skin at bedtime.   lisinopril 5 MG tablet Commonly known as:  PRINIVIL,ZESTRIL Take 1 tablet (5 mg total) by mouth daily.   metFORMIN 500 MG tablet Commonly known as:  GLUCOPHAGE Take 500 mg by mouth  2 (two) times daily with a meal.   metoprolol tartrate 25 MG tablet Commonly known as:  LOPRESSOR Take 1 tablet (25 mg total) by mouth 2  (two) times daily.   potassium chloride SA 20 MEQ tablet Commonly known as:  K-DUR,KLOR-CON Take 1 tablet (20 mEq total) by mouth daily. For 7 days Start taking on:  12/31/2017   sitaGLIPtin 100 MG tablet Commonly known as:  JANUVIA Take 100 mg by mouth every morning.   traMADol 50 MG tablet Commonly known as:  ULTRAM Take 1 tablet (50 mg total) by mouth every 4 (four) hours as needed for moderate pain.      Follow-up Information    [facility] Follow up on 01/22/2018.   Specialty:  Cardiothoracic Surgery Why:  Appointment is at 4:00 pm, please get CXR at 3:30 at [facility] located on first floor of our office building Contact information: [address] [place], [address] [place] [address] [phone]          Signed: Lowella Dandy 12/30/2017, 7:51 AM

## 2017-12-27 NOTE — Care Management Note (Signed)
Case Management Note Donn PieriniKristi Iqra Rotundo RN,BSN Unit [facility] 1[facility]22 Case Manager  [id number]  Patient Details  Name: Colin MiresMilton A [name] MRN: [id number] Date of Birth: April 23, 1956  Subjective/Objective:   Pt admitted s/p CABG x3 on 12/26/17                Action/Plan: PTA pt lived at home with spouse. CM to follow for transition of care needs  Expected Discharge Date:                  Expected Discharge Plan:     In-House Referral:     Discharge planning Services  CM Consult  Post Acute Care Choice:    Choice offered to:     DME Arranged:    DME Agency:     HH Arranged:    HH Agency:     Status of Service:  In process, will continue to follow  If discussed at Long Length of Stay Meetings, dates discussed:    Additional Comments:  Darrold SpanWebster, Ahlia Lemanski Hall, RN 12/27/2017, 10:03 AM

## 2017-12-27 NOTE — Progress Notes (Signed)
1 Day Post-Op Procedure(s) (LRB): CORONARY ARTERY BYPASS GRAFTING (CABG) x 3 Using Left Internal Mammary Artery (LIMA) and Edoscopically Harvested Left Leg Greater Saphenous Vein Graft (SVG); -LIMA to LAD, -SVG to LEFT CIRCUMFLEX, -SVG to PDA, (N/A) TRANSESOPHAGEAL ECHOCARDIOGRAM (TEE) (N/A) ENDOVEIN HARVEST OF GREATER SAPHENOUS VEIN: Left Thigh to below the knee (Left) Subjective: Progressing well  Objective: Vital signs in last 24 hours: Temp:  [95 F (35 C)-98.6 F (37 C)] 97.9 F (36.6 C) (07/17 0800) Pulse Rate:  [76-93] 93 (07/17 0800) Cardiac Rhythm: Normal sinus rhythm (07/17 0800) Resp:  [8-16] 12 (07/17 0800) BP: (84-134)/(52-99) 116/61 (07/17 0800) SpO2:  [99 %-100 %] 99 % (07/17 0800) Arterial Line BP: (91-163)/(44-98) 127/51 (07/17 0800) FiO2 (%):  [40 %-50 %] 40 % (07/16 1737) Weight:  [199 lb 11.8 oz (90.6 kg)] 199 lb 11.8 oz (90.6 kg) (07/17 0500)  Hemodynamic parameters for last 24 hours: PAP: (13-26)/(4-20) 19/6 CO:  [3.5 L/min-8.3 L/min] 7.7 L/min CI:  [1.7 L/min/m2-4 L/min/m2] 3.7 L/min/m2  Intake/Output from previous day: 07/16 0701 - 07/17 0700 In: 7065.1 [I.V.:4814.5; Blood:933; IV Piggyback:1317.6] Out: 3341 [Urine:2355; Blood:650; Chest Tube:336] Intake/Output this shift: Total I/O In: 256.4 [P.O.:120; I.V.:36.4; IV Piggyback:100] Out: -        Exam    General- alert and comfortable    Neck- no JVD, no cervical adenopathy palpable, no carotid bruit   Lungs- clear without rales, wheezes   Cor- regular rate and rhythm, no murmur , gallop   Abdomen- soft, non-tender   Extremities - warm, non-tender, minimal edema   Neuro- oriented, appropriate, no focal weakness   Lab Results: Recent Labs    12/26/17 2002 12/26/17 2007 12/27/17 0406  WBC 11.9*  --  11.3*  HGB 10.0* 8.8* 9.7*  HCT 30.4* 26.0* 28.8*  PLT 129*  --  120*   BMET:  Recent Labs    12/25/17 1007  12/26/17 2007 12/27/17 0406  NA 140   < > 138 136  K 4.7   < > 4.5 4.3  CL  106   < > 106 109  CO2 29  --   --  22  GLUCOSE 130*   < > 176* 187*  BUN 14   < > 16 17  CREATININE 0.96   < > 0.80 1.01  CALCIUM 9.1  --   --  7.5*   < > = values in this interval not displayed.    PT/INR:  Recent Labs    12/26/17 1340  LABPROT 16.7*  INR 1.36   ABG    Component Value Date/Time   PHART 7.437 12/26/2017 1948   HCO3 20.6 12/26/2017 1948   TCO2 23 12/26/2017 2007   ACIDBASEDEF 3.0 (H) 12/26/2017 1948   O2SAT 100.0 12/26/2017 1948   CBG (last 3)  Recent Labs    12/26/17 2314 12/27/17 0342 12/27/17 0739  GLUCAP 197* 162* 137*    Assessment/Plan: S/P Procedure(s) (LRB): CORONARY ARTERY BYPASS GRAFTING (CABG) x 3 Using Left Internal Mammary Artery (LIMA) and Edoscopically Harvested Left Leg Greater Saphenous Vein Graft (SVG); -LIMA to LAD, -SVG to LEFT CIRCUMFLEX, -SVG to PDA, (N/A) TRANSESOPHAGEAL ECHOCARDIOGRAM (TEE) (N/A) ENDOVEIN HARVEST OF GREATER SAPHENOUS VEIN: Left Thigh to below the knee (Left) Mobilize Diuresis Diabetes control d/c tubes/lines See progression orders   LOS: 1 day    Kathlee Nationseter Van Trigt III 12/27/2017

## 2017-12-28 ENCOUNTER — Inpatient Hospital Stay (HOSPITAL_COMMUNITY): Payer: BLUE CROSS/BLUE SHIELD

## 2017-12-28 LAB — BLOOD GAS, ARTERIAL

## 2017-12-28 LAB — BASIC METABOLIC PANEL
Anion gap: 4 — ABNORMAL LOW (ref 5–15)
BUN: 21 mg/dL — ABNORMAL HIGH (ref 6–20)
CO2: 27 mmol/L (ref 22–32)
Calcium: 8 mg/dL — ABNORMAL LOW (ref 8.9–10.3)
Chloride: 108 mmol/L (ref 98–111)
Creatinine, Ser: 0.91 mg/dL (ref 0.61–1.24)
GFR calc Af Amer: >60 mL/min (ref >60)
GFR calc non Af Amer: >60 mL/min (ref >60)
Glucose, Bld: 122 mg/dL — ABNORMAL HIGH (ref 70–99)
Potassium: 3.8 mmol/L (ref 3.5–5.1)
Sodium: 139 mmol/L (ref 135–145)

## 2017-12-28 LAB — GLUCOSE, CAPILLARY
Glucose-Capillary: 240 mg/dL — ABNORMAL HIGH (ref 70–99)
Glucose-Capillary: 118 mg/dL — ABNORMAL HIGH (ref 70–99)
Glucose-Capillary: 123 mg/dL — ABNORMAL HIGH (ref 70–99)
Glucose-Capillary: 150 mg/dL — ABNORMAL HIGH (ref 70–99)
Glucose-Capillary: 161 mg/dL — ABNORMAL HIGH (ref 70–99)
Glucose-Capillary: 119 mg/dL — ABNORMAL HIGH (ref 70–99)

## 2017-12-28 LAB — CBC
HCT: 29.6 % — ABNORMAL LOW (ref 39.0–52.0)
Hemoglobin: 9.6 g/dL — ABNORMAL LOW (ref 13.0–17.0)
MCH: 30.4 pg (ref 26.0–34.0)
MCHC: 32.4 g/dL (ref 30.0–36.0)
MCV: 93.7 fL (ref 78.0–100.0)
Platelets: 130 10*3/uL — ABNORMAL LOW (ref 150–400)
RBC: 3.16 MIL/uL — ABNORMAL LOW (ref 4.22–5.81)
RDW: 13.6 % (ref 11.5–15.5)
WBC: 11.6 10*3/uL — ABNORMAL HIGH (ref 4.0–10.5)

## 2017-12-28 LAB — COOXEMETRY PANEL
Carboxyhemoglobin: 1.4 % (ref 0.5–1.5)
Methemoglobin: 1.2 % (ref 0.0–1.5)
O2 Saturation: 62.7 %
Total hemoglobin: 9.6 g/dL — ABNORMAL LOW (ref 12.0–16.0)

## 2017-12-28 MED ORDER — METOPROLOL TARTRATE 25 MG PO TABS
25.0000 mg | ORAL_TABLET | Freq: Two times a day (BID) | ORAL | Status: DC
  Administered 2017-12-28 – 2017-12-29 (×4): 25 mg via ORAL
  Filled 2017-12-28 (×5): qty 1

## 2017-12-28 MED ORDER — METOPROLOL TARTRATE 25 MG/10 ML ORAL SUSPENSION
12.5000 mg | Freq: Two times a day (BID) | ORAL | Status: DC
  Filled 2017-12-28: qty 10

## 2017-12-28 MED ORDER — ASPIRIN EC 81 MG PO TBEC
81.0000 mg | DELAYED_RELEASE_TABLET | Freq: Every day | ORAL | Status: DC
  Administered 2017-12-28 – 2017-12-29 (×2): 81 mg via ORAL
  Filled 2017-12-28 (×3): qty 1

## 2017-12-28 MED ORDER — MOVING RIGHT ALONG BOOK
Freq: Once | Status: AC
  Administered 2017-12-28: 12:00:00
  Filled 2017-12-28: qty 1

## 2017-12-28 MED ORDER — FUROSEMIDE 10 MG/ML IJ SOLN
40.0000 mg | Freq: Two times a day (BID) | INTRAMUSCULAR | Status: AC
  Administered 2017-12-28: 40 mg via INTRAVENOUS
  Filled 2017-12-28: qty 4

## 2017-12-28 MED ORDER — POTASSIUM CHLORIDE CRYS ER 20 MEQ PO TBCR
20.0000 meq | EXTENDED_RELEASE_TABLET | Freq: Two times a day (BID) | ORAL | Status: DC
  Administered 2017-12-28 – 2017-12-29 (×4): 20 meq via ORAL
  Filled 2017-12-28 (×5): qty 1

## 2017-12-28 MED ORDER — SODIUM CHLORIDE 0.9% FLUSH
3.0000 mL | INTRAVENOUS | Status: DC | PRN
  Administered 2017-12-29: 3 mL via INTRAVENOUS
  Filled 2017-12-28: qty 3

## 2017-12-28 MED ORDER — FUROSEMIDE 40 MG PO TABS
40.0000 mg | ORAL_TABLET | Freq: Every day | ORAL | Status: DC
  Administered 2017-12-29: 40 mg via ORAL
  Filled 2017-12-28 (×2): qty 1

## 2017-12-28 MED ORDER — MAGNESIUM HYDROXIDE 400 MG/5ML PO SUSP: 30.0000 mL | Freq: Every day | ORAL | Status: DC | PRN

## 2017-12-28 MED ORDER — FUROSEMIDE 10 MG/ML IJ SOLN: 40.0000 mg | Freq: Two times a day (BID) | INTRAMUSCULAR | Status: DC

## 2017-12-28 MED ORDER — CLOPIDOGREL BISULFATE 75 MG PO TABS
75.0000 mg | ORAL_TABLET | Freq: Every day | ORAL | Status: DC
  Administered 2017-12-28 – 2017-12-29 (×2): 75 mg via ORAL
  Filled 2017-12-28 (×3): qty 1

## 2017-12-28 MED ORDER — INSULIN ASPART 100 UNIT/ML ~~LOC~~ SOLN
0.0000 [IU] | Freq: Three times a day (TID) | SUBCUTANEOUS | Status: DC
  Administered 2017-12-28 (×2): 2 [IU] via SUBCUTANEOUS
  Administered 2017-12-28: 4 [IU] via SUBCUTANEOUS

## 2017-12-28 MED ORDER — SODIUM CHLORIDE 0.9% FLUSH
3.0000 mL | Freq: Two times a day (BID) | INTRAVENOUS | Status: DC
  Administered 2017-12-29: 3 mL via INTRAVENOUS

## 2017-12-28 MED ORDER — SODIUM CHLORIDE 0.9 % IV SOLN: 250.0000 mL | INTRAVENOUS | Status: DC | PRN

## 2017-12-28 NOTE — Progress Notes (Signed)
Patient arrived to unit at approximately 1650. Patient assessed and WNL. No acute distress. Oriented to room. VS obtained. Patient family in room following CHG bath given. CCMD notified. Sternal Precaution education reiterated. IS encouraged.

## 2017-12-28 NOTE — Progress Notes (Signed)
2 Days Post-Op Procedure(s) (LRB): CORONARY ARTERY BYPASS GRAFTING (CABG) x 3 Using Left Internal Mammary Artery (LIMA) and Edoscopically Harvested Left Leg Greater Saphenous Vein Graft (SVG); -LIMA to LAD, -SVG to LEFT CIRCUMFLEX, -SVG to PDA, (N/A) TRANSESOPHAGEAL ECHOCARDIOGRAM (TEE) (N/A) ENDOVEIN HARVEST OF GREATER SAPHENOUS VEIN: Left Thigh to below the knee (Left) Subjective: Doing well  Objective: Vital signs in last 24 hours: Temp:  [97.9 F (36.6 C)-99.5 F (37.5 C)] 99.3 F (37.4 C) (07/18 0718) Pulse Rate:  [79-107] 98 (07/18 0700) Cardiac Rhythm: Normal sinus rhythm (07/18 0400) Resp:  [11-21] 15 (07/18 0700) BP: (95-158)/(56-88) 154/87 (07/18 0700) SpO2:  [94 %-100 %] 95 % (07/18 0700) Arterial Line BP: (112-127)/(47-51) 112/47 (07/17 0900) Weight:  [198 lb 10.2 oz (90.1 kg)] 198 lb 10.2 oz (90.1 kg) (07/18 0500)  Hemodynamic parameters for last 24 hours: PAP: (19)/(6) 19/6  Intake/Output from previous day: 07/17 0701 - 07/18 0700 In: 1119.3 [P.O.:240; I.V.:581.6; IV Piggyback:297.7] Out: 1620 [Urine:1570; Chest Tube:50] Intake/Output this shift: No intake/output data recorded.       Exam    General- alert and comfortable    Neck- no JVD, no cervical adenopathy palpable, no carotid bruit   Lungs- clear without rales, wheezes   Cor- regular rate and rhythm, no murmur , gallop   Abdomen- soft, non-tender   Extremities - warm, non-tender, minimal edema   Neuro- oriented, appropriate, no focal weakness   Lab Results: Recent Labs    12/27/17 1727 12/27/17 1729 12/28/17 0545  WBC 14.9*  --  11.6*  HGB 9.7* 9.5* 9.6*  HCT 30.2* 28.0* 29.6*  PLT 138*  --  130*   BMET:  Recent Labs    12/27/17 0406  12/27/17 1729 12/28/17 0545  NA 136  --  135 139  K 4.3  --  4.3 3.8  CL 109  --  101 108  CO2 22  --   --  27  GLUCOSE 187*  --  245* 122*  BUN 17  --  22* 21*  CREATININE 1.01   < > 1.00 0.91  CALCIUM 7.5*  --   --  8.0*   < > = values in this  interval not displayed.    PT/INR:  Recent Labs    12/26/17 1340  LABPROT 16.7*  INR 1.36   ABG    Component Value Date/Time   PHART 7.437 12/26/2017 1948   HCO3 20.6 12/26/2017 1948   TCO2 22 12/27/2017 1729   ACIDBASEDEF 3.0 (H) 12/26/2017 1948   O2SAT 62.7 12/28/2017 0600   CBG (last 3)  Recent Labs    12/27/17 2310 12/28/17 0315 12/28/17 0721  GLUCAP 177* 118* 123*    Assessment/Plan: S/P Procedure(s) (LRB): CORONARY ARTERY BYPASS GRAFTING (CABG) x 3 Using Left Internal Mammary Artery (LIMA) and Edoscopically Harvested Left Leg Greater Saphenous Vein Graft (SVG); -LIMA to LAD, -SVG to LEFT CIRCUMFLEX, -SVG to PDA, (N/A) TRANSESOPHAGEAL ECHOCARDIOGRAM (TEE) (N/A) ENDOVEIN HARVEST OF GREATER SAPHENOUS VEIN: Left Thigh to below the knee (Left) Mobilize Diuresis Diabetes control Plan for transfer to step-down: see transfer orders plavix 30 days for ACS   LOS: 2 days    Kathlee Nationseter Van Trigt III 12/28/2017

## 2017-12-29 ENCOUNTER — Other Ambulatory Visit (HOSPITAL_BASED_OUTPATIENT_CLINIC_OR_DEPARTMENT_OTHER): Payer: BLUE CROSS/BLUE SHIELD

## 2017-12-29 ENCOUNTER — Inpatient Hospital Stay (HOSPITAL_COMMUNITY): Payer: BLUE CROSS/BLUE SHIELD

## 2017-12-29 LAB — GLUCOSE, CAPILLARY
Glucose-Capillary: 63 mg/dL — ABNORMAL LOW (ref 70–99)
Glucose-Capillary: 171 mg/dL — ABNORMAL HIGH (ref 70–99)
Glucose-Capillary: 241 mg/dL — ABNORMAL HIGH (ref 70–99)

## 2017-12-29 LAB — CBC
HCT: 27.7 % — ABNORMAL LOW (ref 39.0–52.0)
Hemoglobin: 8.9 g/dL — ABNORMAL LOW (ref 13.0–17.0)
MCH: 30.3 pg (ref 26.0–34.0)
MCHC: 32.1 g/dL (ref 30.0–36.0)
MCV: 94.2 fL (ref 78.0–100.0)
Platelets: 143 10*3/uL — ABNORMAL LOW (ref 150–400)
RBC: 2.94 MIL/uL — ABNORMAL LOW (ref 4.22–5.81)
RDW: 13.4 % (ref 11.5–15.5)
WBC: 9 10*3/uL (ref 4.0–10.5)

## 2017-12-29 LAB — BASIC METABOLIC PANEL
Anion gap: 5 (ref 5–15)
BUN: 17 mg/dL (ref 6–20)
CO2: 27 mmol/L (ref 22–32)
Calcium: 8 mg/dL — ABNORMAL LOW (ref 8.9–10.3)
Chloride: 108 mmol/L (ref 98–111)
Creatinine, Ser: 0.92 mg/dL (ref 0.61–1.24)
GFR calc Af Amer: >60 mL/min (ref >60)
GFR calc non Af Amer: >60 mL/min (ref >60)
Glucose, Bld: 81 mg/dL (ref 70–99)
Potassium: 4 mmol/L (ref 3.5–5.1)
Sodium: 140 mmol/L (ref 135–145)

## 2017-12-29 MED ORDER — INSULIN GLARGINE 100 UNIT/ML ~~LOC~~ SOLN
20.0000 [IU] | Freq: Every day | SUBCUTANEOUS | Status: DC
  Administered 2017-12-29: 20 [IU] via SUBCUTANEOUS
  Filled 2017-12-29: qty 0.2

## 2017-12-29 MED ORDER — LISINOPRIL 5 MG PO TABS
5.0000 mg | ORAL_TABLET | Freq: Every day | ORAL | Status: DC
  Administered 2017-12-29: 5 mg via ORAL
  Filled 2017-12-29 (×2): qty 1

## 2017-12-29 MED ORDER — METFORMIN HCL 500 MG PO TABS
500.0000 mg | ORAL_TABLET | Freq: Two times a day (BID) | ORAL | Status: DC
  Administered 2017-12-29: 500 mg via ORAL
  Filled 2017-12-29 (×2): qty 1

## 2017-12-29 NOTE — Discharge Instructions (Signed)
Discharge Instructions: ° °1. You may shower, please wash incisions daily with soap and water and keep dry.  If you wish to cover wounds with dressing you may do so but please keep clean and change daily.  No tub baths or swimming until incisions have completely healed.  If your incisions become red or develop any drainage please call our office at [phone] ° °2. No Driving until cleared by Dr. Van Trigt's office and you are no longer using narcotic pain medications ° °3. Monitor your weight daily.. Please use the same scale and weigh at same time... If you gain 5-10 lbs in 48 hours with associated lower extremity swelling, please contact our office at [phone] ° °4. Fever of 101.5 for at least 24 hours with no source, please contact our office at [phone] ° °5. Activity- up as tolerated, please walk at least 3 times per day.  Avoid strenuous activity, no lifting, pushing, or pulling with your arms over 8-10 lbs for a minimum of 6 weeks ° °6. If any questions or concerns arise, please do not hesitate to contact our office at [phone] ° °

## 2017-12-29 NOTE — Anesthesia Postprocedure Evaluation (Signed)
Anesthesia Post Note  Patient: [name]  Procedure(s) Performed: CORONARY ARTERY BYPASS GRAFTING (CABG) x 3 Using Left Internal Mammary Artery (LIMA) and Edoscopically Harvested Left Leg Greater Saphenous Vein Graft (SVG); -LIMA to LAD, -SVG to LEFT CIRCUMFLEX, -SVG to PDA, (N/A Chest) TRANSESOPHAGEAL ECHOCARDIOGRAM (TEE) (N/A ) ENDOVEIN HARVEST OF GREATER SAPHENOUS VEIN: Left Thigh to below the knee (Left )     Patient location during evaluation: SICU Anesthesia Type: General Level of consciousness: sedated Pain management: pain level controlled Vital Signs Assessment: post-procedure vital signs reviewed and stable Respiratory status: patient remains intubated per anesthesia plan Cardiovascular status: stable Postop Assessment: no apparent nausea or vomiting Anesthetic complications: no    Last Vitals:  Vitals:   12/29/17 0420 12/29/17 1340  BP: 133/75 139/88  Pulse: 90 82  Resp: 18 18  Temp: 36.9 C 37.3 C  SpO2: 95% 97%    Last Pain:  Vitals:   12/29/17 1511  TempSrc:   PainSc: 0-No pain                 Mumin Denomme

## 2017-12-29 NOTE — Progress Notes (Signed)
CARDIAC REHAB PHASE I   PRE:  Rate/Rhythm: 87 SR  BP:  Sitting: 139/88      SaO2: 98 RA  MODE:  Ambulation: 450 ft   POST:  Rate/Rhythm: 93 SR  BP:  Sitting: 142/90    SaO2: 99 RA   Pt ambulated 48250ft in hallway independently with slow steady gait. Pt denies CP or SOB. Pt and family educated on sternal precautions, in-the-tube sheet given. Pt educated to shower daily and monitor incisions. Pt and family given heart healthy and diabetic diets. Reviewed restrictions and exercise guidelines. Pt given cardiac surgery booklet and instructions on how to view d/c video. Will refer to CRP II [place].    [id number] Reynold Boweneresa  Baili Stang, RN BSN 12/29/2017 2:07 PM

## 2017-12-29 NOTE — Progress Notes (Signed)
      [address]       [place] [id number]             [phone]      3 Days Post-Op Procedure(s) (LRB): CORONARY ARTERY BYPASS GRAFTING (CABG) x 3 Using Left Internal Mammary Artery (LIMA) and Edoscopically Harvested Left Leg Greater Saphenous Vein Graft (SVG); -LIMA to LAD, -SVG to LEFT CIRCUMFLEX, -SVG to PDA, (N/A) TRANSESOPHAGEAL ECHOCARDIOGRAM (TEE) (N/A) ENDOVEIN HARVEST OF GREATER SAPHENOUS VEIN: Left Thigh to below the knee (Left)   Subjective:  No new complaints.  He feels really good.  + ambulation  + BM  Objective: Vital signs in last 24 hours: Temp:  [98.4 F (36.9 C)-99 F (37.2 C)] 98.4 F (36.9 C) (07/19 0420) Pulse Rate:  [85-107] 90 (07/19 0420) Cardiac Rhythm: Normal sinus rhythm (07/19 0700) Resp:  [13-18] 18 (07/19 0420) BP: (98-150)/(66-90) 133/75 (07/19 0420) SpO2:  [95 %-99 %] 95 % (07/19 0420) Weight:  [193 lb 5.5 oz (87.7 kg)-198 lb 6.6 oz (90 kg)] 193 lb 5.5 oz (87.7 kg) (07/19 0420)  Intake/Output from previous day: 07/18 0701 - 07/19 0700 In: 631.7 [P.O.:480; I.V.:51.7; IV Piggyback:100] Out: 2025 [Urine:2025]  General appearance: alert, cooperative and no distress Heart: regular rate and rhythm Lungs: clear to auscultation bilaterally Abdomen: soft, non-tender; bowel sounds normal; no masses,  no organomegaly Extremities: edema trace Wound: clean and dry  Lab Results: Recent Labs    12/28/17 0545 12/29/17 0400  WBC 11.6* 9.0  HGB 9.6* 8.9*  HCT 29.6* 27.7*  PLT 130* 143*   BMET:  Recent Labs    12/28/17 0545 12/29/17 0400  NA 139 140  K 3.8 4.0  CL 108 108  CO2 27 27  GLUCOSE 122* 81  BUN 21* 17  CREATININE 0.91 0.92  CALCIUM 8.0* 8.0*    PT/INR:  Recent Labs    12/26/17 1340  LABPROT 16.7*  INR 1.36   ABG    Component Value Date/Time   PHART 7.437 12/26/2017 1948   HCO3 20.6 12/26/2017 1948   TCO2 22 12/27/2017 1729   ACIDBASEDEF 3.0 (H) 12/26/2017 1948   O2SAT 62.7 12/28/2017 0600    CBG (last 3)  Recent Labs    12/28/17 1544 12/28/17 2055 12/29/17 0600  GLUCAP 161* 119* 63*    Assessment/Plan: S/P Procedure(s) (LRB): CORONARY ARTERY BYPASS GRAFTING (CABG) x 3 Using Left Internal Mammary Artery (LIMA) and Edoscopically Harvested Left Leg Greater Saphenous Vein Graft (SVG); -LIMA to LAD, -SVG to LEFT CIRCUMFLEX, -SVG to PDA, (N/A) TRANSESOPHAGEAL ECHOCARDIOGRAM (TEE) (N/A) ENDOVEIN HARVEST OF GREATER SAPHENOUS VEIN: Left Thigh to below the knee (Left)  1. CV- NSR, mild HTN- continue Lopressor at 25 mg BID, will start low dose Lisinopril for better BP control 2. Pulm- no acute issues, continue IS 3. Renal-creatinine, weight is mildly elevated, continue Lasix, potassium supplementation 4. CBGs controlled will restart home Metformin 5. Dispo- patient looks great, doing very well, will start low dose Lisinopril for better BP control, d/c EPW today, possibly ready for d/c in next 24-48 hours   LOS: 3 days    [name] 12/29/2017

## 2017-12-30 ENCOUNTER — Encounter (HOSPITAL_COMMUNITY): Payer: Self-pay | Admitting: Cardiothoracic Surgery

## 2017-12-30 LAB — BASIC METABOLIC PANEL
Anion gap: 5 (ref 5–15)
BUN: 14 mg/dL (ref 6–20)
CO2: 28 mmol/L (ref 22–32)
Calcium: 8 mg/dL — ABNORMAL LOW (ref 8.9–10.3)
Chloride: 105 mmol/L (ref 98–111)
Creatinine, Ser: 1.01 mg/dL (ref 0.61–1.24)
GFR calc Af Amer: >60 mL/min (ref >60)
GFR calc non Af Amer: >60 mL/min (ref >60)
Glucose, Bld: 199 mg/dL — ABNORMAL HIGH (ref 70–99)
Potassium: 4 mmol/L (ref 3.5–5.1)
Sodium: 138 mmol/L (ref 135–145)

## 2017-12-30 LAB — CBC
HCT: 28.6 % — ABNORMAL LOW (ref 39.0–52.0)
Hemoglobin: 9.2 g/dL — ABNORMAL LOW (ref 13.0–17.0)
MCH: 30.1 pg (ref 26.0–34.0)
MCHC: 32.2 g/dL (ref 30.0–36.0)
MCV: 93.5 fL (ref 78.0–100.0)
Platelets: 162 10*3/uL (ref 150–400)
RBC: 3.06 MIL/uL — ABNORMAL LOW (ref 4.22–5.81)
RDW: 13.3 % (ref 11.5–15.5)
WBC: 7.1 10*3/uL (ref 4.0–10.5)

## 2017-12-30 LAB — GLUCOSE, CAPILLARY: Glucose-Capillary: 149 mg/dL — ABNORMAL HIGH (ref 70–99)

## 2017-12-30 MED ORDER — ACETAMINOPHEN 500 MG PO TABS: 1000.0000 mg | ORAL_TABLET | Freq: Four times a day (QID) | ORAL | 0 refills | Status: AC | PRN

## 2017-12-30 MED ORDER — CLOPIDOGREL BISULFATE 75 MG PO TABS: 75.0000 mg | ORAL_TABLET | Freq: Every day | ORAL | 1 refills | Status: DC

## 2017-12-30 MED ORDER — LISINOPRIL 5 MG PO TABS: 5.0000 mg | ORAL_TABLET | Freq: Every day | ORAL | 3 refills | Status: DC

## 2017-12-30 MED ORDER — TRAMADOL HCL 50 MG PO TABS: 50.0000 mg | ORAL_TABLET | ORAL | 0 refills | Status: DC | PRN

## 2017-12-30 MED ORDER — FUROSEMIDE 40 MG PO TABS: 40.0000 mg | ORAL_TABLET | Freq: Every day | ORAL | 0 refills | Status: DC

## 2017-12-30 MED ORDER — POTASSIUM CHLORIDE CRYS ER 20 MEQ PO TBCR: 20.0000 meq | EXTENDED_RELEASE_TABLET | Freq: Every day | ORAL | 0 refills | Status: DC

## 2017-12-30 NOTE — Progress Notes (Signed)
Discharged to home with family office visits in place teaching done  

## 2017-12-30 NOTE — Progress Notes (Signed)
      [address]       [place] [id number]             [phone]      4 Days Post-Op Procedure(s) (LRB): CORONARY ARTERY BYPASS GRAFTING (CABG) x 3 Using Left Internal Mammary Artery (LIMA) and Edoscopically Harvested Left Leg Greater Saphenous Vein Graft (SVG); -LIMA to LAD, -SVG to LEFT CIRCUMFLEX, -SVG to PDA, (N/A) TRANSESOPHAGEAL ECHOCARDIOGRAM (TEE) (N/A) ENDOVEIN HARVEST OF GREATER SAPHENOUS VEIN: Left Thigh to below the knee (Left)   Subjective:  Patient complains of fatigue this morning.  He states that he just hasn't been able to rest well in the hospital.  He states its difficult to get comfortable and there are interruptions overnight.  Objective: Vital signs in last 24 hours: Temp:  [98.7 F (37.1 C)-99.2 F (37.3 C)] 98.7 F (37.1 C) (07/20 0544) Pulse Rate:  [82-98] 89 (07/20 0544) Cardiac Rhythm: Normal sinus rhythm (07/20 0700) Resp:  [16-20] 20 (07/20 0544) BP: (133-145)/(82-89) 133/82 (07/20 0544) SpO2:  [95 %-97 %] 95 % (07/20 0544) Weight:  [188 lb 0.8 oz (85.3 kg)] 188 lb 0.8 oz (85.3 kg) (07/20 0544)  Intake/Output from previous day: No intake/output data recorded. Intake/Output this shift: No intake/output data recorded.  General appearance: alert, cooperative and no distress Heart: regular rate and rhythm Lungs: clear to auscultation bilaterally Abdomen: soft, non-tender; bowel sounds normal; no masses,  no organomegaly Extremities: edema trace Wound: clean and dry  Lab Results: Recent Labs    12/29/17 0400 12/30/17 0312  WBC 9.0 7.1  HGB 8.9* 9.2*  HCT 27.7* 28.6*  PLT 143* 162   BMET:  Recent Labs    12/29/17 0400 12/30/17 0312  NA 140 138  K 4.0 4.0  CL 108 105  CO2 27 28  GLUCOSE 81 199*  BUN 17 14  CREATININE 0.92 1.01  CALCIUM 8.0* 8.0*    PT/INR: No results for input(s): LABPROT, INR in the last 72 hours. ABG    Component Value Date/Time   PHART 7.437 12/26/2017 1948   HCO3 20.6 12/26/2017 1948     TCO2 22 12/27/2017 1729   ACIDBASEDEF 3.0 (H) 12/26/2017 1948   O2SAT 62.7 12/28/2017 0600   CBG (last 3)  Recent Labs    12/29/17 0600 12/29/17 1635 12/29/17 2055  GLUCAP 63* 171* 241*    Assessment/Plan: S/P Procedure(s) (LRB): CORONARY ARTERY BYPASS GRAFTING (CABG) x 3 Using Left Internal Mammary Artery (LIMA) and Edoscopically Harvested Left Leg Greater Saphenous Vein Graft (SVG); -LIMA to LAD, -SVG to LEFT CIRCUMFLEX, -SVG to PDA, (N/A) TRANSESOPHAGEAL ECHOCARDIOGRAM (TEE) (N/A) ENDOVEIN HARVEST OF GREATER SAPHENOUS VEIN: Left Thigh to below the knee (Left)  1. CV- NSR, mild HTN- continue Lopressor, Lisinopril 2. Pulm- no acute issues, off oxygen, continue IS 3. Renal- creatinine WNL, weight is at baseline, will taper lasix off over next 7 days 4. DM- sugars okay, will continue home medications at discharge 5. Dispo- patient stable, doing well, remains hemodynamically stable in NSR, will d/c home today   LOS: 4 days    Colin Dandyrin Barrett 12/30/2017

## 2018-01-03 ENCOUNTER — Other Ambulatory Visit: Payer: Self-pay | Admitting: *Deleted

## 2018-01-03 DIAGNOSIS — Z9889 Other specified postprocedural states: Principal | ICD-10-CM

## 2018-01-03 DIAGNOSIS — R110 Nausea: Secondary | ICD-10-CM

## 2018-01-03 MED ORDER — ONDANSETRON HCL 4 MG PO TABS: 4.0000 mg | ORAL_TABLET | Freq: Four times a day (QID) | ORAL | 0 refills | Status: DC | PRN

## 2018-01-04 ENCOUNTER — Other Ambulatory Visit (HOSPITAL_BASED_OUTPATIENT_CLINIC_OR_DEPARTMENT_OTHER): Payer: BLUE CROSS/BLUE SHIELD

## 2018-01-11 ENCOUNTER — Ambulatory Visit: Payer: BLUE CROSS/BLUE SHIELD | Admitting: Cardiology

## 2018-01-12 ENCOUNTER — Telehealth: Payer: Self-pay

## 2018-01-12 NOTE — Telephone Encounter (Signed)
Patient's wife called and stated that patient has been having acid reflux, vomiting, and dry heaving.  Upon returning the phone call, I spoke with the patient.  He stated that it happens more at night when laying down.  He stated that last night he could not sleep due to the pain.  He stated that he ate pizza last night with spicy chicken and onions on it which may have played a role in the heart burn.  When asked if he had seen a physician about the issue, he stated that he had seen a gastrologist in the past but was reluctant to call them because he said they would need to have an EGD done.  I advised him that he needed to contact his gastrologist and get in to see them.  However, in the mean time, he could take over the counter antiacids.  I told him milk of magnesia was an okay option with his medications.  Or over-the-counter zantac or Pepcid.  He acknowledged receipt and thanked me for the call.

## 2018-01-17 ENCOUNTER — Other Ambulatory Visit: Payer: Self-pay | Admitting: Cardiothoracic Surgery

## 2018-01-17 DIAGNOSIS — Z951 Presence of aortocoronary bypass graft: Secondary | ICD-10-CM

## 2018-01-22 ENCOUNTER — Other Ambulatory Visit: Payer: Self-pay

## 2018-01-22 ENCOUNTER — Ambulatory Visit
Admission: RE | Admit: 2018-01-22 | Discharge: 2018-01-22 | Disposition: A | Discharge status: Home / Self Care | Payer: BLUE CROSS/BLUE SHIELD | Source: Ambulatory Visit | Attending: Cardiothoracic Surgery | Admitting: Cardiothoracic Surgery

## 2018-01-22 ENCOUNTER — Ambulatory Visit (INDEPENDENT_AMBULATORY_CARE_PROVIDER_SITE_OTHER): Payer: Self-pay | Admitting: Surgical

## 2018-01-22 VITALS — BP 110/70 | HR 94 | Resp 16 | Ht 74.0 in | Wt 182.0 lb

## 2018-01-22 DIAGNOSIS — Z951 Presence of aortocoronary bypass graft: Secondary | ICD-10-CM

## 2018-01-22 DIAGNOSIS — I2510 Atherosclerotic heart disease of native coronary artery without angina pectoris: Secondary | ICD-10-CM

## 2018-01-22 DIAGNOSIS — J90 Pleural effusion, not elsewhere classified: Secondary | ICD-10-CM | POA: Diagnosis not present

## 2018-01-22 NOTE — Progress Notes (Signed)
[address]       [place] [id number]             [phone]      Katherina MiresMilton A [facility] Medical Record #[id number] Date of Birth: 1955-11-16  Referring: Lyn RecordsSmith, Henry W, MD Primary Care: Simone CuriaLee, Keung, MD Primary Cardiologist: No primary care provider on file.   Chief Complaint:   POST OP FOLLOW UP  OPERATIVE REPORT  DATE OF PROCEDURE:  12/26/2017  PREOPERATIVE DIAGNOSES:   1.  Severe 3-vessel coronary artery disease with positive stress test and mild left ventricular dysfunction. 2.  Type 2 diabetes.  POSTOPERATIVE DIAGNOSES:   1.  Severe 3-vessel coronary artery disease with positive stress test and mild left ventricular dysfunction. 2.  Type 2 diabetes.  PROCEDURES PERFORMED: 1.  Coronary artery bypass grafting x3 (left internal mammary artery to left anterior descending, saphenous vein graft to obtuse marginal, saphenous vein graft to posterior descending. 2.  Endoscopic harvest of left leg greater saphenous vein.  SURGEON:  Kerin PernaPeter Van Trigt, III, MD.  ASSISTANLowella Dandy:  Erin Barrett, PA-C.    ANESTHESIA:  General, Dr. Corky Soxhris Moser.   History of Present Illness:    Patient is a 62 year old male status post the above described procedure.  He is seen in the office on today's date and routine postsurgical follow-up.  He is doing quite well.  He denies any fevers, chills or other constitutional symptoms.  He denies shortness of breath or chest pain.  He has had no difficulty with his incisions.  He has no palpitations.  He does have some mild lower extremity edema.  He is extremely pleased with his progress.      Past Medical History:  Diagnosis Date  . Arthritis   . Coronary artery disease   . Diabetes mellitus without complication (HCC)    Type II  . GERD (gastroesophageal reflux disease)   . Myocardial infarction [facility](HCC)    "Silent"  . Neuropathy      Social History   Tobacco Use  Smoking Status Never Smoker  Smokeless Tobacco  Never Used    Social History   Substance and Sexual Activity  Alcohol Use Not Currently   Comment: occasional- 1 beer every 3 monthly     Allergies  Allergen Reactions  . Codeine Sulfate [Codeine]     UNSPECIFIED REACTION     Current Outpatient Medications  Medication Sig Dispense Refill  . acetaminophen (TYLENOL) 500 MG tablet Take 2 tablets (1,000 mg total) by mouth every 6 (six) hours as needed for mild pain or fever. 30 tablet 0  . aspirin EC 81 MG tablet Take 81 mg by mouth daily.    Marland Kitchen. atorvastatin (LIPITOR) 40 MG tablet Take 1 tablet (40 mg total) by mouth daily at 6 PM. 90 tablet 2  . calcium carbonate (TUMS - DOSED IN MG ELEMENTAL CALCIUM) 500 MG chewable tablet Chew 1-2 tablets by mouth daily as needed for indigestion or heartburn.    . clopidogrel (PLAVIX) 75 MG tablet Take 1 tablet (75 mg total) by mouth daily. 30 tablet 1  . insulin detemir (LEVEMIR) 100 UNIT/ML injection Inject 25 Units into the skin at bedtime.    Marland Kitchen. lisinopril (PRINIVIL,ZESTRIL) 5 MG tablet Take 1 tablet (5 mg total) by mouth daily. 30 tablet 3  . metFORMIN (GLUCOPHAGE) 500 MG tablet Take 500 mg by mouth 2 (two) times daily with a meal.    . metoprolol tartrate (LOPRESSOR) 25 MG tablet  Take 1 tablet (25 mg total) by mouth 2 (two) times daily. 180 tablet 2  . sitaGLIPtin (JANUVIA) 100 MG tablet Take 100 mg by mouth every morning.      No current facility-administered medications for this visit.        Physical Exam: BP 110/70 (BP Location: Right Arm, Patient Position: Sitting, Cuff Size: Normal) Comment (Cuff Size): manually  Pulse 94   Resp 16   Ht 6\' 2"  (1.88 m)   Wt 82.6 kg   SpO2 100% Comment: RA  BMI 23.37 kg/m   General appearance: alert, cooperative and no distress Heart: regular rate and rhythm Lungs: clear to auscultation bilaterally Abdomen: benign exam Extremities: minor edema Wound: Incisions healing well without evidence of infection.   Diagnostic Studies & Laboratory  data:     Recent Radiology Findings:   Dg Chest 2 View  Result Date: 01/22/2018 CLINICAL DATA:  CABG EXAM: CHEST - 2 VIEW COMPARISON:  12/29/2017 FINDINGS: Elevated left hemidiaphragm unchanged. This was not present on the preop study. Improved aeration left lower lobe. Improvement in small left effusion. No pneumothorax Right lung clear.  Negative for heart failure. IMPRESSION: Elevated left hemidiaphragm. Improvement in left lower lobe atelectasis and small left effusion since the prior study. Electronically Signed   By: Marlan Palauharles  Clark M.D.   On: 01/22/2018 15:32      Recent Lab Findings: Lab Results  Component Value Date   WBC 7.1 12/30/2017   HGB 9.2 (L) 12/30/2017   HCT 28.6 (L) 12/30/2017   PLT 162 12/30/2017   GLUCOSE 199 (H) 12/30/2017   ALT 21 12/25/2017   AST 17 12/25/2017   NA 138 12/30/2017   K 4.0 12/30/2017   CL 105 12/30/2017   CREATININE 1.01 12/30/2017   BUN 14 12/30/2017   CO2 28 12/30/2017   INR 1.36 12/26/2017   HGBA1C 7.7 (H) 12/25/2017      Assessment / Plan: Patient is doing well.  We had a long discussion regarding nutrition and lifestyle as well as activity progression.  See him again in 1 month for routine follow-up.  At that time if there is no surgically related issues we will see again on a as needed basis.          Rowe ClackWayne E Rhian Asebedo, PA-C 01/22/2018 4:06 PM Pager [phone]

## 2018-01-22 NOTE — Patient Instructions (Signed)
Discussed activity progression and lifestyle changes.  Including nutrition.

## 2018-01-30 ENCOUNTER — Ambulatory Visit (INDEPENDENT_AMBULATORY_CARE_PROVIDER_SITE_OTHER): Payer: BLUE CROSS/BLUE SHIELD | Admitting: Cardiology

## 2018-01-30 ENCOUNTER — Encounter: Payer: Self-pay | Admitting: Cardiology

## 2018-01-30 VITALS — BP 122/72 | HR 86 | Ht 74.0 in | Wt 181.0 lb

## 2018-01-30 DIAGNOSIS — Z951 Presence of aortocoronary bypass graft: Secondary | ICD-10-CM

## 2018-01-30 DIAGNOSIS — I10 Essential (primary) hypertension: Secondary | ICD-10-CM

## 2018-01-30 DIAGNOSIS — I2510 Atherosclerotic heart disease of native coronary artery without angina pectoris: Secondary | ICD-10-CM

## 2018-01-30 DIAGNOSIS — E7800 Pure hypercholesterolemia, unspecified: Secondary | ICD-10-CM

## 2018-01-30 LAB — ECHO TEE
AV Mean grad: 2 mmHg
Ao-prox: 2.7
LVOT diameter: 21 mm
Mean grad: 1 mmHg
STJ: 2.9 cm
Sinus: 3.4 cm

## 2018-01-30 MED ORDER — METOPROLOL SUCCINATE ER 50 MG PO TB24: 50.0000 mg | ORAL_TABLET | Freq: Every day | ORAL | 2 refills | Status: DC

## 2018-01-30 NOTE — Patient Instructions (Addendum)
Medication Instructions:  Your physician has recommended you make the following change in your medication:  STOP metoprolol 25 mg START metoprolol succinate 50 mg daily  Labwork: None  Testing/Procedures: None  Follow-Up: Your physician recommends that you schedule a follow-up appointment in: 1 month  Any Other Special Instructions Will Be Listed Below (If Applicable).     If you need a refill on your cardiac medications before your next appointment, please call your pharmacy.   [facility]  Garey HamAshley A, RN, BSN

## 2018-01-30 NOTE — Progress Notes (Signed)
Cardiology Office Note:    Date:  01/30/2018   ID:  [name], DOB 1956-04-13, MRN [id number]  PCP:  [name], MD  Cardiologist:  Gypsy Balsam, MD    Referring MD: [name], MD   No chief complaint on file. Doing very well  History of Present Illness:    [name] is a 62 y.o. male with coronary artery disease status post recent coronary artery bypass graft done about a month ago.  What led to coronary artery bypass graft was the fact that his EKG shows some abnormality he was referred to [place] with a stress test which was grossly abnormal cardiac catheterization was done which showed severe triple-vessel disease he underwent coronary artery bypass graft doing very well he is very happy and satisfied with result he said he feels already better.  He started walking on the regular basis still have some soreness in the chest but otherwise no problems.  Past Medical History:  Diagnosis Date  . Arthritis   . Coronary artery disease   . Diabetes mellitus without complication (HCC)    Type II  . GERD (gastroesophageal reflux disease)   . Myocardial infarction [facility])    "Silent"  . Neuropathy     Past Surgical History:  Procedure Laterality Date  . CATARACT EXTRACTION Bilateral   . COLONOSCOPY W/ POLYPECTOMY    . CORONARY ARTERY BYPASS GRAFT N/A 12/26/2017   Procedure: CORONARY ARTERY BYPASS GRAFTING (CABG) x 3 Using Left Internal Mammary Artery (LIMA) and Edoscopically Harvested Left Leg Greater Saphenous Vein Graft (SVG); -LIMA to LAD, -SVG to LEFT CIRCUMFLEX, -SVG to PDA,;  Surgeon: Kerin Perna, MD;  Location: [facility] OR;  Service: Open Heart Surgery;  Laterality: N/A;  . ENDOVEIN HARVEST OF GREATER SAPHENOUS VEIN Left 12/26/2017   Procedure: ENDOVEIN HARVEST OF GREATER SAPHENOUS VEIN: Left Thigh to below the knee;  Surgeon: Kerin Perna, MD;  Location: [facility] OR;  Service: Open Heart Surgery;  Laterality: Left;  . INTRAVASCULAR PRESSURE WIRE/FFR STUDY N/A 12/19/2017   Procedure: INTRAVASCULAR PRESSURE WIRE/FFR STUDY;  Surgeon: Lyn Records, MD;  [facility]: [facility] INV[facility] LAB;  Service: Cardiovascular;  Laterality: N/A;  . LEFT HEART CATH AND CORONARY ANGIOGRAPHY N/A 12/19/2017   Procedure: LEFT HEART CATH AND CORONARY ANGIOGRAPHY;  Surgeon: Lyn Records, MD;  Location: [facility] INVASIVE CV LAB;  Service: Cardiovascular;  Laterality: N/A;  . TEE WITHOUT CARDIOVERSION N/A 12/26/2017   Procedure: TRANSESOPHAGEAL ECHOCARDIOGRAM (TEE);  Surgeon: Donata Clay, Theron Arista, MD;  Location: [facility] OR;  Service: Open Heart Surgery;  Laterality: N/A;  . VASECTOMY      Current Medications: Current Meds  Medication Sig  . acetaminophen (TYLENOL) 500 MG tablet Take 2 tablets (1,000 mg total) by mouth every 6 (six) hours as needed for mild pain or fever.  Marland Kitchen aspirin EC 81 MG tablet Take 81 mg by mouth daily.  Marland Kitchen atorvastatin (LIPITOR) 40 MG tablet Take 1 tablet (40 mg total) by mouth daily at 6 PM.  . calcium carbonate (TUMS - DOSED IN MG ELEMENTAL CALCIUM) 500 MG chewable tablet Chew 1-2 tablets by mouth daily as needed for indigestion or heartburn.  . clopidogrel (PLAVIX) 75 MG tablet Take 1 tablet (75 mg total) by mouth daily.  . insulin detemir (LEVEMIR) 100 UNIT/ML injection Inject 25 Units into the skin at bedtime.  Marland Kitchen lisinopril (PRINIVIL,ZESTRIL) 5 MG tablet Take 1 tablet (5 mg total) by mouth daily.  . metFORMIN (GLUCOPHAGE) 500 MG tablet Take 500 mg by  mouth 2 (two) times daily with a meal.  . metoprolol tartrate (LOPRESSOR) 25 MG tablet Take 1 tablet (25 mg total) by mouth 2 (two) times daily.  . sitaGLIPtin (JANUVIA) 100 MG tablet Take 100 mg by mouth every morning.      Allergies:   Codeine sulfate [codeine]   Social History   Socioeconomic History  . Marital status: Married    Spouse name: Not on file  . Number of children: Not on file  . Years of education: Not on file  . Highest education level: Not on file  Occupational History  . Not on file  Social Needs  .  Financial resource strain: Not on file  . Food insecurity:    Worry: Not on file    Inability: Not on file  . Transportation needs:    Medical: Not on file    Non-medical: Not on file  Tobacco Use  . Smoking status: Never Smoker  . Smokeless tobacco: Never Used  Substance and Sexual Activity  . Alcohol use: Not Currently    Comment: occasional- 1 beer every 3 monthly  . Drug use: Not on file  . Sexual activity: Not on file  Lifestyle  . Physical activity:    Days per week: Not on file    Minutes per session: Not on file  . Stress: Not on file  Relationships  . Social connections:    Talks on phone: Not on file    Gets together: Not on file    Attends religious service: Not on file    Active member of club or organization: Not on file    Attends meetings of clubs or organizations: Not on file    Relationship status: Not on file  Other Topics Concern  . Not on file  Social History Narrative  . Not on file     Family History: The patient's family history includes Diabetes in his daughter, father, and mother; Heart disease in his father. ROS:   Please see the history of present illness.    All 14 point review of systems negative except as described per history of present illness  EKGs/Labs/Other Studies Reviewed:      Recent Labs: 12/25/2017: ALT 21 12/27/2017: Magnesium 2.5 12/30/2017: BUN 14; Creatinine, Ser 1.01; Hemoglobin 9.2; Platelets 162; Potassium 4.0; Sodium 138  Recent Lipid Panel No results found for: CHOL, TRIG, HDL, CHOLHDL, VLDL, LDLCALC, LDLDIRECT  Physical Exam:    VS:  BP 122/72 (BP Location: Right Arm, Patient Position: Sitting, Cuff Size: Normal)   Pulse 86   Ht 6\' 2"  (1.88 m)   Wt 181 lb (82.1 kg)   SpO2 98%   BMI 23.24 kg/m     Wt Readings from Last 3 Encounters:  01/30/18 181 lb (82.1 kg)  01/22/18 182 lb (82.6 kg)  12/30/17 188 lb 0.8 oz (85.3 kg)     GEN:  Well nourished, well developed in no acute distress HEENT: Normal NECK: No  JVD; No carotid bruits LYMPHATICS: No lymphadenopathy CARDIAC: RRR, no murmurs, no rubs, no gallops RESPIRATORY:  Clear to auscultation without rales, wheezing or rhonchi  ABDOMEN: Soft, non-tender, non-distended MUSCULOSKELETAL:  No edema; No deformity  SKIN: Warm and dry LOWER EXTREMITIES: no swelling NEUROLOGIC:  Alert and oriented x 3 PSYCHIATRIC:  Normal affect   ASSESSMENT:    1. S/P CABG x 3   2. Hypercholesterolemia   3. Essential hypertension   4. Coronary artery disease involving native coronary artery of native heart without angina  pectoris    PLAN:    In order of problems listed above:  1. Status post coronary artery bypass graft with LIMA to LAD SVG to for this marginal SVG to PDA recovering very nicely doing very well. 2. Dyslipidemia my anticipation is to put him on a higher dose of Lipitor.  I will call primary care physician fasting lipid profile.  Also he is scheduled to see his primary care physician next week and I understand fasting lipid profile will be done that my intention will be to keep him on 80 mg of Lipitor his LDL need to be well below 70. 3. Essential hypertension blood pressure appears to be well controlled we will continue present management. 4. Coronary artery disease with a inferior wall microinfarction.  He is on aspirin Plavix which I will continue.  He is also on ACE inhibitor as well as beta-blocker I will switch his Lopressor to Toprol-XL.  In the future anticipate to be able to increase dose of ACE inhibitor since the multiple benefits of this medication. 5. Diabetes mellitus followed by internal medicine team he may benefit from [place].  See him back in my office in about a month sooner if he get a problem   Medication Adjustments/Labs and Tests Ordered: Current medicines are reviewed at length with the patient today.  Concerns regarding medicines are outlined above.  No orders of the defined types were placed in this  encounter.  Medication changes: No orders of the defined types were placed in this encounter.   Signed, Georgeanna Leaobert J. Candi Profit, MD, [facility] 01/30/2018 8:48 AM    [facility]

## 2018-02-06 DIAGNOSIS — E1140 Type 2 diabetes mellitus with diabetic neuropathy, unspecified: Secondary | ICD-10-CM | POA: Diagnosis not present

## 2018-02-06 DIAGNOSIS — I10 Essential (primary) hypertension: Secondary | ICD-10-CM | POA: Diagnosis not present

## 2018-02-06 DIAGNOSIS — K219 Gastro-esophageal reflux disease without esophagitis: Secondary | ICD-10-CM | POA: Diagnosis not present

## 2018-02-06 DIAGNOSIS — E119 Type 2 diabetes mellitus without complications: Secondary | ICD-10-CM | POA: Diagnosis not present

## 2018-02-06 DIAGNOSIS — E7800 Pure hypercholesterolemia, unspecified: Secondary | ICD-10-CM | POA: Diagnosis not present

## 2018-02-18 ENCOUNTER — Other Ambulatory Visit: Payer: Self-pay | Admitting: Physician Assistant

## 2018-02-27 ENCOUNTER — Other Ambulatory Visit: Payer: Self-pay | Admitting: Cardiology

## 2018-02-27 ENCOUNTER — Other Ambulatory Visit: Payer: Self-pay | Admitting: Cardiothoracic Surgery

## 2018-02-27 DIAGNOSIS — Z951 Presence of aortocoronary bypass graft: Secondary | ICD-10-CM

## 2018-02-28 ENCOUNTER — Ambulatory Visit (INDEPENDENT_AMBULATORY_CARE_PROVIDER_SITE_OTHER): Payer: Self-pay | Admitting: Cardiothoracic Surgery

## 2018-02-28 ENCOUNTER — Other Ambulatory Visit: Payer: Self-pay

## 2018-02-28 ENCOUNTER — Ambulatory Visit
Admission: RE | Admit: 2018-02-28 | Discharge: 2018-02-28 | Disposition: A | Discharge status: Home / Self Care | Payer: BLUE CROSS/BLUE SHIELD | Source: Ambulatory Visit | Attending: Cardiothoracic Surgery | Admitting: Cardiothoracic Surgery

## 2018-02-28 ENCOUNTER — Encounter: Payer: Self-pay | Admitting: Cardiothoracic Surgery

## 2018-02-28 VITALS — BP 138/84 | HR 83 | Resp 18 | Ht 74.0 in | Wt 182.4 lb

## 2018-02-28 DIAGNOSIS — Z951 Presence of aortocoronary bypass graft: Secondary | ICD-10-CM

## 2018-02-28 DIAGNOSIS — J9811 Atelectasis: Secondary | ICD-10-CM | POA: Diagnosis not present

## 2018-02-28 DIAGNOSIS — I2510 Atherosclerotic heart disease of native coronary artery without angina pectoris: Secondary | ICD-10-CM

## 2018-02-28 NOTE — Progress Notes (Signed)
PCP is Simone CuriaLee, Keung, MD Referring Provider is Lyn RecordsSmith, Henry W, MD  Chief Complaint  Patient presents with  . Routine Post Op    1 month f/u with a CXR..s/p CABG 12/25/17    Final 233-month postop visit after urgent CABG x3 Patient doing well, back to work, no cardiac symptoms Surgical incisions all healing well Chest x-ray today is clear Patient elected not to do cardiac rehab but is walking 20 minutes or more 4 days a week We discussed his postoperative medications   Past Medical History:  Diagnosis Date  . Arthritis   . Coronary artery disease   . Diabetes mellitus without complication (HCC)    Type II  . GERD (gastroesophageal reflux disease)   . Myocardial infarction [facility](HCC)    "Silent"  . Neuropathy     Past Surgical History:  Procedure Laterality Date  . CATARACT EXTRACTION Bilateral   . COLONOSCOPY W/ POLYPECTOMY    . CORONARY ARTERY BYPASS GRAFT N/A 12/26/2017   Procedure: CORONARY ARTERY BYPASS GRAFTING (CABG) x 3 Using Left Internal Mammary Artery (LIMA) and Edoscopically Harvested Left Leg Greater Saphenous Vein Graft (SVG); -LIMA to LAD, -SVG to LEFT CIRCUMFLEX, -SVG to PDA,;  Surgeon: Kerin PernaVan Trigt, Peter, MD;  [facility];  Service: Open Heart Surgery;  Laterality: N/A;  . ENDOVEIN HARVEST OF GREATER SAPHENOUS VEIN Left 12/26/2017   Procedure: ENDOVEIN HARVEST OF GREATER SAPHENOUS VEIN: Left Thigh to below the knee;  Surgeon: Kerin PernaVan Trigt, Peter, MD;  [facility];  Service: Open Heart Surgery;  Laterality: Left;  . INTRAVASCULAR PRESSURE WIRE/FFR STUDY N/A 12/19/2017   Procedure: INTRAVASCULAR PRESSURE WIRE/FFR STUDY;  Surgeon: Lyn RecordsSmith, Henry W, MD;  Location: [facility] INVASIVE CV LAB;  Service: Cardiovascular;  Laterality: N/A;  . LEFT HEART CATH AND CORONARY ANGIOGRAPHY N/A 12/19/2017   Procedure: LEFT HEART CATH AND CORONARY ANGIOGRAPHY;  Surgeon: Lyn RecordsSmith, Henry W, MD;  Location: [facility] INVASIVE CV LAB;  Service: Cardiovascular;  Laterality: N/A;  . TEE WITHOUT CARDIOVERSION N/A  12/26/2017   Procedure: TRANSESOPHAGEAL ECHOCARDIOGRAM (TEE);  Surgeon: Donata ClayVan Trigt, Theron AristaPeter, MD;  [facility];  Service: Open Heart Surgery;  Laterality: N/A;  . VASECTOMY      Family History  Problem Relation Age of Onset  . Diabetes Mother   . Diabetes Father   . Heart disease Father   . Diabetes Daughter     Social History Social History   Tobacco Use  . Smoking status: Never Smoker  . Smokeless tobacco: Never Used  Substance Use Topics  . Alcohol use: Not Currently    Comment: occasional- 1 beer every 3 monthly  . Drug use: Not on file    Current Outpatient Medications  Medication Sig Dispense Refill  . acetaminophen (TYLENOL) 500 MG tablet Take 2 tablets (1,000 mg total) by mouth every 6 (six) hours as needed for mild pain or fever. 30 tablet 0  . aspirin EC 81 MG tablet Take 81 mg by mouth daily.    Marland Kitchen. atorvastatin (LIPITOR) 40 MG tablet Take 1 tablet (40 mg total) by mouth daily at 6 PM. 90 tablet 2  . calcium carbonate (TUMS - DOSED IN MG ELEMENTAL CALCIUM) 500 MG chewable tablet Chew 1-2 tablets by mouth daily as needed for indigestion or heartburn.    . clopidogrel (PLAVIX) 75 MG tablet TAKE 1 TABLET EVERY DAY 30 tablet 10  . insulin detemir (LEVEMIR) 100 UNIT/ML injection Inject 25 Units into the skin at bedtime.    Marland Kitchen. lisinopril (PRINIVIL,ZESTRIL) 5 MG  tablet Take 1 tablet (5 mg total) by mouth daily. 30 tablet 3  . metFORMIN (GLUCOPHAGE) 500 MG tablet Take 500 mg by mouth 2 (two) times daily with a meal.    . metoprolol succinate (TOPROL-XL) 50 MG 24 hr tablet Take 1 tablet (50 mg total) by mouth daily. Take with or immediately following a meal. 90 tablet 2  . sitaGLIPtin (JANUVIA) 100 MG tablet Take 100 mg by mouth every morning.      No current facility-administered medications for this visit.     Allergies  Allergen Reactions  . Codeine Sulfate [Codeine]     UNSPECIFIED REACTION     Review of Systems  Weight stable No edema No fever  BP 138/84 (BP  Location: Right Arm, Patient Position: Sitting, Cuff Size: Normal)   Pulse 83   Resp 18   Ht 6\' 2"  (1.88 m)   Wt 182 lb 6.4 oz (82.7 kg)   SpO2 99% Comment: RA  BMI 23.42 kg/m  Physical Exam      Exam    General- alert and comfortable    Neck- no JVD, no cervical adenopathy palpable, no carotid bruit   Lungs- clear without rales, wheezes   Cor- regular rate and rhythm, no murmur , gallop   Abdomen- soft, non-tender   Extremities - warm, non-tender, minimal edema   Neuro- oriented, appropriate, no focal weakness   Diagnostic Tests: Chest x-ray clear  Impression: Patient doing well 2 months postop.  He may stop his Plavix.  Continue other medications continue heart healthy diet and lifestyle  Plan: Return as needed  Mikey Bussing, MD [facility] [phone]

## 2018-03-06 DIAGNOSIS — I10 Essential (primary) hypertension: Secondary | ICD-10-CM | POA: Diagnosis not present

## 2018-03-15 ENCOUNTER — Ambulatory Visit: Payer: BLUE CROSS/BLUE SHIELD | Admitting: Cardiology

## 2018-04-04 ENCOUNTER — Other Ambulatory Visit: Payer: Self-pay | Admitting: Emergency Medicine

## 2018-04-04 MED ORDER — LISINOPRIL 5 MG PO TABS: 5.0000 mg | ORAL_TABLET | Freq: Every day | ORAL | 1 refills | Status: DC

## 2018-04-04 NOTE — Telephone Encounter (Signed)
Patient calling for lisinopril 5 mg daily refill. Refilled this medication

## 2018-04-24 ENCOUNTER — Ambulatory Visit: Payer: BLUE CROSS/BLUE SHIELD | Admitting: Cardiology

## 2018-06-19 ENCOUNTER — Ambulatory Visit: Payer: BLUE CROSS/BLUE SHIELD | Admitting: Cardiology

## 2018-06-19 ENCOUNTER — Encounter: Payer: Self-pay | Admitting: Cardiology

## 2018-06-19 VITALS — BP 128/76 | HR 73 | Ht 74.0 in | Wt 180.8 lb

## 2018-06-19 DIAGNOSIS — Z951 Presence of aortocoronary bypass graft: Secondary | ICD-10-CM

## 2018-06-19 DIAGNOSIS — I10 Essential (primary) hypertension: Secondary | ICD-10-CM | POA: Diagnosis not present

## 2018-06-19 DIAGNOSIS — I2510 Atherosclerotic heart disease of native coronary artery without angina pectoris: Secondary | ICD-10-CM | POA: Diagnosis not present

## 2018-06-19 DIAGNOSIS — K219 Gastro-esophageal reflux disease without esophagitis: Secondary | ICD-10-CM

## 2018-06-19 DIAGNOSIS — E7800 Pure hypercholesterolemia, unspecified: Secondary | ICD-10-CM

## 2018-06-19 NOTE — Progress Notes (Signed)
Cardiology Office Note:    Date:  06/19/2018   ID:  [name], DOB June 01, 1956, MRN [id number]  PCP:  [name], MD  Cardiologist:  Gypsy Balsam, MD    Referring MD: [name], MD   Chief Complaint  Patient presents with  . Follow-up  Doing well  History of Present Illness:    [name] is a 63 y.o. male with coronary artery disease status post coronary artery bypass graft last year recovered completely doing very well asymptomatic no chest pain tightness squeezing pressure burning chest.  He complained of having heartburn.  That happened after certain type of food especially at evening time when he tried to lay down.  He was given some medication by his gastroenterologist however ran out of it and now this problem bothers him a lot he is active he walks every single day try to walk between 1 and 2 miles every day to stay healthy also change his diet he knows about Mediterranean diet and I reemphasized the importance of it.  His cholesterol is managed with his primary care physician however latest laboratory testing of these from August of this year his LDL is 73 need to be better he is going back to his primary care physician this month to have it rechecked but my suggestion was to increase dose of Lipitor  Past Medical History:  Diagnosis Date  . Arthritis   . Coronary artery disease   . Diabetes mellitus without complication (HCC)    Type II  . GERD (gastroesophageal reflux disease)   . Myocardial infarction [facility])    "Silent"  . Neuropathy     Past Surgical History:  Procedure Laterality Date  . CATARACT EXTRACTION Bilateral   . COLONOSCOPY W/ POLYPECTOMY    . CORONARY ARTERY BYPASS GRAFT N/A 12/26/2017   Procedure: CORONARY ARTERY BYPASS GRAFTING (CABG) x 3 Using Left Internal Mammary Artery (LIMA) and Edoscopically Harvested Left Leg Greater Saphenous Vein Graft (SVG); -LIMA to LAD, -SVG to LEFT CIRCUMFLEX, -SVG to PDA,;  Surgeon: Kerin Perna, MD;   Location: [facility] OR;  Service: Open Heart Surgery;  Laterality: N/A;  . ENDOVEIN HARVEST OF GREATER SAPHENOUS VEIN Left 12/26/2017   Procedure: ENDOVEIN HARVEST OF GREATER SAPHENOUS VEIN: Left Thigh to below the knee;  Surgeon: Kerin Perna, MD;  Location: [facility] OR;  Service: Open Heart Surgery;  Laterality: Left;  . INTRAVASCULAR PRESSURE WIRE/FFR STUDY N/A 12/19/2017   Procedure: INTRAVASCULAR PRESSURE WIRE/FFR STUDY;  Surgeon: Lyn Records, MD;  [facility] INVAS[facility] CV LAB;  Service: Cardiovascular;  Laterality: N/A;  . LEFT HEART CATH AND CORONARY ANGIOGRAPHY N/A 12/19/2017   Procedure: LEFT HEART CATH AND CORONARY ANGIOGRAPHY;  Surgeon: Lyn Records, MD;  [facility]: [facility] INV[facility] CV LAB;  Service: Cardiovascular;  Laterality: N/A;  . TEE WITHOUT CARDIOVERSION N/A 12/26/2017   Procedure: TRANSESOPHAGEAL ECHOCARDIOGRAM (TEE);  Surgeon: Donata Clay, Theron Arista, MD;  Location: [facility] OR;  Service: Open Heart Surgery;  Laterality: N/A;  . VASECTOMY      Current Medications: Current Meds  Medication Sig  . acetaminophen (TYLENOL) 500 MG tablet Take 2 tablets (1,000 mg total) by mouth every 6 (six) hours as needed for mild pain or fever.  Marland Kitchen aspirin EC 81 MG tablet Take 81 mg by mouth daily.  Marland Kitchen atorvastatin (LIPITOR) 40 MG tablet Take 1 tablet (40 mg total) by mouth daily at 6 PM.  . calcium carbonate (TUMS - DOSED IN MG ELEMENTAL CALCIUM) 500 MG chewable tablet Chew  1-2 tablets by mouth daily as needed for indigestion or heartburn.  . insulin detemir (LEVEMIR) 100 UNIT/ML injection Inject 25 Units into the skin at bedtime.  Marland Kitchen. lisinopril (PRINIVIL,ZESTRIL) 5 MG tablet Take 1 tablet (5 mg total) by mouth daily.  . metFORMIN (GLUCOPHAGE) 500 MG tablet Take 500 mg by mouth 2 (two) times daily with a meal.  . metoprolol succinate (TOPROL-XL) 50 MG 24 hr tablet Take 1 tablet (50 mg total) by mouth daily. Take with or immediately following a meal.  . sitaGLIPtin (JANUVIA) 100 MG tablet Take 100 mg by mouth every  morning.      Allergies:   Codeine sulfate [codeine]   Social History   Socioeconomic History  . Marital status: Married    Spouse name: Not on file  . Number of children: Not on file  . Years of education: Not on file  . Highest education level: Not on file  Occupational History  . Not on file  Social Needs  . Financial resource strain: Not on file  . Food insecurity:    Worry: Not on file    Inability: Not on file  . Transportation needs:    Medical: Not on file    Non-medical: Not on file  Tobacco Use  . Smoking status: Never Smoker  . Smokeless tobacco: Never Used  Substance and Sexual Activity  . Alcohol use: Not Currently    Comment: occasional- 1 beer every 3 monthly  . Drug use: Not on file  . Sexual activity: Not on file  Lifestyle  . Physical activity:    Days per week: Not on file    Minutes per session: Not on file  . Stress: Not on file  Relationships  . Social connections:    Talks on phone: Not on file    Gets together: Not on file    Attends religious service: Not on file    Active member of club or organization: Not on file    Attends meetings of clubs or organizations: Not on file    Relationship status: Not on file  Other Topics Concern  . Not on file  Social History Narrative  . Not on file     Family History: The patient's family history includes Diabetes in his daughter, father, and mother; Heart disease in his father. ROS:   Please see the history of present illness.    All 14 point review of systems negative except as described per history of present illness  EKGs/Labs/Other Studies Reviewed:      Recent Labs: 12/25/2017: ALT 21 12/27/2017: Magnesium 2.5 12/30/2017: BUN 14; Creatinine, Ser 1.01; Hemoglobin 9.2; Platelets 162; Potassium 4.0; Sodium 138  Recent Lipid Panel No results found for: CHOL, TRIG, HDL, CHOLHDL, VLDL, LDLCALC, LDLDIRECT  Physical Exam:    VS:  BP 128/76   Pulse 73   Ht 6\' 2"  (1.88 m)   Wt 180 lb 12.8  oz (82 kg)   SpO2 97%   BMI 23.21 kg/m     Wt Readings from Last 3 Encounters:  06/19/18 180 lb 12.8 oz (82 kg)  02/28/18 182 lb 6.4 oz (82.7 kg)  01/30/18 181 lb (82.1 kg)     GEN:  Well nourished, well developed in no acute distress HEENT: Normal NECK: No JVD; No carotid bruits LYMPHATICS: No lymphadenopathy CARDIAC: RRR, no murmurs, no rubs, no gallops RESPIRATORY:  Clear to auscultation without rales, wheezing or rhonchi  ABDOMEN: Soft, non-tender, non-distended MUSCULOSKELETAL:  No edema; No deformity  SKIN: Warm and dry LOWER EXTREMITIES: no swelling NEUROLOGIC:  Alert and oriented x 3 PSYCHIATRIC:  Normal affect   ASSESSMENT:    1. S/P CABG x 3   2. Hypercholesterolemia   3. Essential hypertension   4. Coronary artery disease involving native coronary artery of native heart without angina pectoris   5. Gastroesophageal reflux disease, esophagitis presence not specified    PLAN:    In order of problems listed above:  1. Status post coronary bypass graft doing well from that point review wound healed completely 2. Dyslipidemia I will suggest increase the dose of Lipitor to 80 however he wants to wait until fasting lipid profile be done by his primary care physician he need to be on high intensity statin that he is already on it however his LDL need to be less than 70.  It is 73 in August 3. Essential hypertension blood pressure well controlled continue present management. 4. Gastroesophageal reflux disease I wanted to give him some omeprazole however he prefers to go to gastroenterologist to take care of this and that is what I strongly recommend.  Overall he recovered nicely we talked about healthy lifestyle which include exercises on a regular basis as well as good diet he will do it.   Medication Adjustments/Labs and Tests Ordered: Current medicines are reviewed at length with the patient today.  Concerns regarding medicines are outlined above.  No orders of the  defined types were placed in this encounter.  Medication changes: No orders of the defined types were placed in this encounter.   Signed, Georgeanna Lea, MD, [facility] 06/19/2018 8:46 AM    [facility]

## 2018-06-19 NOTE — Patient Instructions (Signed)
Medication Instructions:  Your physician recommends that you continue on your current medications as directed. Please refer to the Current Medication list given to you today.  If you need a refill on your cardiac medications before your next appointment, please call your pharmacy.   Lab work: None ordered If you have labs (blood work) drawn today and your tests are completely normal, you will receive your results only by: . MyChart Message (if you have MyChart) OR . A paper copy in the mail If you have any lab test that is abnormal or we need to change your treatment, we will call you to review the results.  Testing/Procedures: None ordered  Follow-Up: At [facility], you and your health needs are our priority.  As part of our continuing mission to provide you with exceptional heart care, we have created designated Provider Care Teams.  These Care Teams include your primary Cardiologist (physician) and Advanced Practice Providers (APPs -  Physician Assistants and Nurse Practitioners) who all work together to provide you with the care you need, when you need it. You will need a follow up appointment in 6 months.  Please call our office 2 months in advance to schedule this appointment.  You may see  Robert Krasowski or another member of our [facility] Provider Team in [place]: Brian Munley, MD . Rajan Revankar, MD  Any Other Special Instructions Will Be Listed Below (If Applicable).    

## 2018-07-09 DIAGNOSIS — I10 Essential (primary) hypertension: Secondary | ICD-10-CM | POA: Diagnosis not present

## 2018-07-09 DIAGNOSIS — I2510 Atherosclerotic heart disease of native coronary artery without angina pectoris: Secondary | ICD-10-CM | POA: Diagnosis not present

## 2018-07-09 DIAGNOSIS — E7800 Pure hypercholesterolemia, unspecified: Secondary | ICD-10-CM | POA: Diagnosis not present

## 2018-07-09 DIAGNOSIS — E119 Type 2 diabetes mellitus without complications: Secondary | ICD-10-CM | POA: Diagnosis not present

## 2018-07-09 DIAGNOSIS — K219 Gastro-esophageal reflux disease without esophagitis: Secondary | ICD-10-CM | POA: Diagnosis not present

## 2018-07-09 DIAGNOSIS — E1140 Type 2 diabetes mellitus with diabetic neuropathy, unspecified: Secondary | ICD-10-CM | POA: Diagnosis not present

## 2018-07-09 DIAGNOSIS — Z1331 Encounter for screening for depression: Secondary | ICD-10-CM | POA: Diagnosis not present

## 2018-10-02 ENCOUNTER — Other Ambulatory Visit: Payer: Self-pay | Admitting: Cardiology

## 2018-10-02 NOTE — Telephone Encounter (Signed)
Lisinopril sent to [facility] on [address]

## 2018-11-07 DIAGNOSIS — E7800 Pure hypercholesterolemia, unspecified: Secondary | ICD-10-CM | POA: Diagnosis not present

## 2018-11-07 DIAGNOSIS — I10 Essential (primary) hypertension: Secondary | ICD-10-CM | POA: Diagnosis not present

## 2018-11-07 DIAGNOSIS — E1140 Type 2 diabetes mellitus with diabetic neuropathy, unspecified: Secondary | ICD-10-CM | POA: Diagnosis not present

## 2018-11-07 DIAGNOSIS — E119 Type 2 diabetes mellitus without complications: Secondary | ICD-10-CM | POA: Diagnosis not present

## 2018-12-07 ENCOUNTER — Telehealth: Payer: Self-pay | Admitting: Emergency Medicine

## 2018-12-07 ENCOUNTER — Other Ambulatory Visit: Payer: Self-pay

## 2018-12-07 ENCOUNTER — Telehealth (INDEPENDENT_AMBULATORY_CARE_PROVIDER_SITE_OTHER): Payer: BC Managed Care – PPO | Admitting: Cardiology

## 2018-12-07 ENCOUNTER — Encounter: Payer: Self-pay | Admitting: Cardiology

## 2018-12-07 VITALS — Wt 181.0 lb

## 2018-12-07 DIAGNOSIS — I2510 Atherosclerotic heart disease of native coronary artery without angina pectoris: Secondary | ICD-10-CM

## 2018-12-07 DIAGNOSIS — E1140 Type 2 diabetes mellitus with diabetic neuropathy, unspecified: Secondary | ICD-10-CM

## 2018-12-07 DIAGNOSIS — E7800 Pure hypercholesterolemia, unspecified: Secondary | ICD-10-CM

## 2018-12-07 DIAGNOSIS — Z951 Presence of aortocoronary bypass graft: Secondary | ICD-10-CM | POA: Diagnosis not present

## 2018-12-07 DIAGNOSIS — I10 Essential (primary) hypertension: Secondary | ICD-10-CM

## 2018-12-07 NOTE — Patient Instructions (Signed)
Medication Instructions:  Your physician recommends that you continue on your current medications as directed. Please refer to the Current Medication list given to you today.  If you need a refill on your cardiac medications before your next appointment, please call your pharmacy.   Lab work: None.  If you have labs (blood work) drawn today and your tests are completely normal, you will receive your results only by: . MyChart Message (if you have MyChart) OR . A paper copy in the mail If you have any lab test that is abnormal or we need to change your treatment, we will call you to review the results.  Testing/Procedures: None.   Follow-Up: At [facility], you and your health needs are our priority.  As part of our continuing mission to provide you with exceptional heart care, we have created designated Provider Care Teams.  These Care Teams include your primary Cardiologist (physician) and Advanced Practice Providers (APPs -  Physician Assistants and Nurse Practitioners) who all work together to provide you with the care you need, when you need it. You will need a follow up appointment in 5 months.  Please call our office 2 months in advance to schedule this appointment.  You may see No primary care provider on file. or another member of our [facility] Provider Team in [place]: Brian Munley, MD . Rajan Revankar, MD  Any Other Special Instructions Will Be Listed Below (If Applicable).    

## 2018-12-07 NOTE — Progress Notes (Signed)
Virtual Visit via Telephone Note   This visit type was conducted due to national recommendations for restrictions regarding the COVID-19 Pandemic (e.g. social distancing) in an effort to limit this patient's exposure and mitigate transmission in our community.  Due to his co-morbid illnesses, this patient is at least at moderate risk for complications without adequate follow up.  This format is felt to be most appropriate for this patient at this time.  The patient did not have access to video technology/had technical difficulties with video requiring transitioning to audio format only (telephone).  All issues noted in this document were discussed and addressed.  No physical exam could be performed with this format.  Please refer to the patient's chart for his  consent to telehealth for [facility].  Evaluation Performed:  Follow-up visit  This visit type was conducted due to national recommendations for restrictions regarding the COVID-19 Pandemic (e.g. social distancing).  This format is felt to be most appropriate for this patient at this time.  All issues noted in this document were discussed and addressed.  No physical exam was performed (except for noted visual exam findings with Video Visits).  Please refer to the patient's chart (MyChart message for video visits and phone note for telephone visits) for the patient's consent to telehealth for [facility].  Date:  12/07/2018  ID: [name], DOB 1955-07-21, MRN [id number]   Patient Location: [place] [address]   Provider location:   [place] Office  PCP:  [name], MD  Cardiologist:  Jenne Campus, MD     Chief Complaint: I am doing well  History of Present Illness:    [name] is a 63 y.o. male  who presents via audio/video conferencing for a telehealth visit today.  Surgical history significant for coronary disease status post coronary bypass graft in summer 2019,  hypertension, dyslipidemia, diabetes.  Overall he is doing well denies having any chest pain tightness squeezing pressure burning chest he is active he walks on a regular basis have no problem doing it.  Recently he seen his primary care physician a lot of blood tests were done.  All were good including cholesterol.  His hemoglobin A 1C however slightly was elevated he is very concerned about it he decided dramatically change his diet habits.  His daughter who living with him does have type 1 diabetes therefore they will both stick with good diet regimen.   The patient does not have symptoms concerning for COVID-19 infection (fever, chills, cough, or new SHORTNESS OF BREATH).    Prior CV studies:   The following studies were reviewed today:       Past Medical History:  Diagnosis Date  . Arthritis   . Coronary artery disease   . Diabetes mellitus without complication ([place])    Type II  . GERD (gastroesophageal reflux disease)   . Myocardial infarction [facility])    "Silent"  . Neuropathy     Past Surgical History:  Procedure Laterality Date  . CATARACT EXTRACTION Bilateral   . COLONOSCOPY W/ POLYPECTOMY    . CORONARY ARTERY BYPASS GRAFT N/A 12/26/2017   Procedure: CORONARY ARTERY BYPASS GRAFTING (CABG) x 3 Using Left Internal Mammary Artery (LIMA) and Edoscopically Harvested Left Leg Greater Saphenous Vein Graft (SVG); -LIMA to LAD, -SVG to LEFT CIRCUMFLEX, -SVG to PDA,;  Surgeon: Ivin Poot, MD;  Location: [place];  Service: Open Heart Surgery;  Laterality: N/A;  . ENDOVEIN HARVEST  OF GREATER SAPHENOUS VEIN Left 12/26/2017   Procedure: ENDOVEIN HARVEST OF GREATER SAPHENOUS VEIN: Left Thigh to below the knee;  Surgeon: Kerin PernaVan Trigt, Peter, MD;  [facility];  Service: Open Heart Surgery;  Laterality: Left;  . INTRAVASCULAR PRESSURE WIRE/FFR STUDY N/A 12/19/2017   Procedure: INTRAVASCULAR PRESSURE WIRE/FFR STUDY;  Surgeon: Lyn RecordsSmith, Henry W, MD;  [facility]: [facility] INVASIVE CV LAB;  Service:  Cardiovascular;  Laterality: N/A;  . LEFT HEART CATH AND CORONARY ANGIOGRAPHY N/A 12/19/2017   Procedure: LEFT HEART CATH AND CORONARY ANGIOGRAPHY;  Surgeon: Lyn RecordsSmith, Henry W, MD;  Location: [facility] INVASIVE CV LAB;  Service: Cardiovascular;  Laterality: N/A;  . TEE WITHOUT CARDIOVERSION N/A 12/26/2017   Procedure: TRANSESOPHAGEAL ECHOCARDIOGRAM (TEE);  Surgeon: Donata ClayVan Trigt, Theron AristaPeter, MD;  [facility];  Service: Open Heart Surgery;  Laterality: N/A;  . VASECTOMY       Current Meds  Medication Sig  . acetaminophen (TYLENOL) 500 MG tablet Take 2 tablets (1,000 mg total) by mouth every 6 (six) hours as needed for mild pain or fever.  Marland Kitchen. aspirin EC 81 MG tablet Take 81 mg by mouth daily.  Marland Kitchen. atorvastatin (LIPITOR) 40 MG tablet Take 1 tablet (40 mg total) by mouth daily at 6 PM.  . calcium carbonate (TUMS - DOSED IN MG ELEMENTAL CALCIUM) 500 MG chewable tablet Chew 1-2 tablets by mouth daily as needed for indigestion or heartburn.  Marland Kitchen. lisinopril (ZESTRIL) 5 MG tablet TAKE 1 TABLET BY MOUTH EVERY DAY  . metFORMIN (GLUCOPHAGE) 500 MG tablet Take 500 mg by mouth 2 (two) times daily with a meal.  . metoprolol succinate (TOPROL-XL) 50 MG 24 hr tablet Take 1 tablet (50 mg total) by mouth daily. Take with or immediately following a meal.  . sitaGLIPtin (JANUVIA) 100 MG tablet Take 100 mg by mouth every morning.       Family History: The patient's family history includes Diabetes in his daughter, father, and mother; Heart disease in his father.   ROS:   Please see the history of present illness.     All other systems reviewed and are negative.   Labs/Other Tests and Data Reviewed:     Recent Labs: 12/25/2017: ALT 21 12/27/2017: Magnesium 2.5 12/30/2017: BUN 14; Creatinine, Ser 1.01; Hemoglobin 9.2; Platelets 162; Potassium 4.0; Sodium 138  Recent Lipid Panel No results found for: CHOL, TRIG, HDL, CHOLHDL, VLDL, LDLCALC, LDLDIRECT    Exam:    Vital Signs:  Wt 181 lb (82.1 kg)   BMI 23.24 kg/m      Wt Readings from Last 3 Encounters:  12/07/18 181 lb (82.1 kg)  06/19/18 180 lb 12.8 oz (82 kg)  02/28/18 182 lb 6.4 oz (82.7 kg)     Well nourished, well developed in no acute distress. Alert awake 3 talking to me over the phone unable to establish video link therefore we just talk over the phone.  Asymptomatic not in any distress  Diagnosis for this visit:   1. Coronary artery disease involving native coronary artery of native heart without angina pectoris   2. S/P CABG x 3   3. Hypercholesterolemia   4. Controlled type 2 diabetes mellitus with neuropathy (HCC)   5. Essential hypertension      ASSESSMENT & PLAN:    1.  Coronary artery disease stable on appropriate medications I will continue. 2.  Status post coronary artery bypass graft.  Noted 3.  Dyslipidemia: We will call primary care physician to get fasting lipid profile 4.  Type 2  diabetes with elevated hemoglobin A1c he understands a problem and is serious about it will change his diet dramatically that should help.  Follow-up by primary care 5.  Essential hypertension stable  COVID-19 Education: The signs and symptoms of COVID-19 were discussed with the patient and how to seek care for testing (follow up with PCP or arrange E-visit).  The importance of social distancing was discussed today.  Patient Risk:   After full review of this patients clinical status, I feel that they are at least moderate risk at this time.  Time:   Today, I have spent 15 minutes with the patient with telehealth technology discussing pt health issues.  I spent 5 minutes reviewing her chart before the visit.  Visit was finished at 3:06 PM.    Medication Adjustments/Labs and Tests Ordered: Current medicines are reviewed at length with the patient today.  Concerns regarding medicines are outlined above.  No orders of the defined types were placed in this encounter.  Medication changes: No orders of the defined types were placed in this  encounter.    Disposition: Follow-up in  Signed, Georgeanna Leaobert J. Krasowski, MD, [facility] 12/07/2018 3:08 PM    [facility]

## 2018-12-07 NOTE — Telephone Encounter (Signed)
Left message for patient to return call regarding discharge instructions.

## 2018-12-26 DIAGNOSIS — K219 Gastro-esophageal reflux disease without esophagitis: Secondary | ICD-10-CM | POA: Diagnosis not present

## 2018-12-26 DIAGNOSIS — E7800 Pure hypercholesterolemia, unspecified: Secondary | ICD-10-CM | POA: Diagnosis not present

## 2018-12-26 DIAGNOSIS — E119 Type 2 diabetes mellitus without complications: Secondary | ICD-10-CM | POA: Diagnosis not present

## 2018-12-26 DIAGNOSIS — E1140 Type 2 diabetes mellitus with diabetic neuropathy, unspecified: Secondary | ICD-10-CM | POA: Diagnosis not present

## 2019-03-28 DIAGNOSIS — E1140 Type 2 diabetes mellitus with diabetic neuropathy, unspecified: Secondary | ICD-10-CM | POA: Diagnosis not present

## 2019-03-28 DIAGNOSIS — E119 Type 2 diabetes mellitus without complications: Secondary | ICD-10-CM | POA: Diagnosis not present

## 2019-03-28 DIAGNOSIS — K219 Gastro-esophageal reflux disease without esophagitis: Secondary | ICD-10-CM | POA: Diagnosis not present

## 2019-03-28 DIAGNOSIS — E7800 Pure hypercholesterolemia, unspecified: Secondary | ICD-10-CM | POA: Diagnosis not present

## 2019-03-28 DIAGNOSIS — I10 Essential (primary) hypertension: Secondary | ICD-10-CM | POA: Diagnosis not present

## 2019-04-14 ENCOUNTER — Other Ambulatory Visit: Payer: Self-pay | Admitting: Cardiology

## 2019-05-08 DIAGNOSIS — Z23 Encounter for immunization: Secondary | ICD-10-CM | POA: Diagnosis not present

## 2019-09-30 DIAGNOSIS — E7800 Pure hypercholesterolemia, unspecified: Secondary | ICD-10-CM | POA: Diagnosis not present

## 2019-09-30 DIAGNOSIS — E119 Type 2 diabetes mellitus without complications: Secondary | ICD-10-CM | POA: Diagnosis not present

## 2019-09-30 DIAGNOSIS — I10 Essential (primary) hypertension: Secondary | ICD-10-CM | POA: Diagnosis not present

## 2019-09-30 DIAGNOSIS — E1140 Type 2 diabetes mellitus with diabetic neuropathy, unspecified: Secondary | ICD-10-CM | POA: Diagnosis not present

## 2019-09-30 DIAGNOSIS — K219 Gastro-esophageal reflux disease without esophagitis: Secondary | ICD-10-CM | POA: Diagnosis not present

## 2019-10-14 DIAGNOSIS — E1140 Type 2 diabetes mellitus with diabetic neuropathy, unspecified: Secondary | ICD-10-CM | POA: Diagnosis not present

## 2019-10-14 DIAGNOSIS — E7800 Pure hypercholesterolemia, unspecified: Secondary | ICD-10-CM | POA: Diagnosis not present

## 2019-10-14 DIAGNOSIS — E119 Type 2 diabetes mellitus without complications: Secondary | ICD-10-CM | POA: Diagnosis not present

## 2019-10-14 DIAGNOSIS — K219 Gastro-esophageal reflux disease without esophagitis: Secondary | ICD-10-CM | POA: Diagnosis not present

## 2019-12-10 ENCOUNTER — Other Ambulatory Visit: Payer: Self-pay | Admitting: Cardiology

## 2020-03-06 ENCOUNTER — Other Ambulatory Visit: Payer: Self-pay | Admitting: Cardiology

## 2020-03-31 DIAGNOSIS — Z23 Encounter for immunization: Secondary | ICD-10-CM | POA: Diagnosis not present

## 2020-03-31 DIAGNOSIS — I2510 Atherosclerotic heart disease of native coronary artery without angina pectoris: Secondary | ICD-10-CM | POA: Diagnosis not present

## 2020-03-31 DIAGNOSIS — E119 Type 2 diabetes mellitus without complications: Secondary | ICD-10-CM | POA: Diagnosis not present

## 2020-03-31 DIAGNOSIS — I10 Essential (primary) hypertension: Secondary | ICD-10-CM | POA: Diagnosis not present

## 2020-03-31 DIAGNOSIS — E1140 Type 2 diabetes mellitus with diabetic neuropathy, unspecified: Secondary | ICD-10-CM | POA: Diagnosis not present

## 2020-03-31 DIAGNOSIS — Z1331 Encounter for screening for depression: Secondary | ICD-10-CM | POA: Diagnosis not present

## 2020-05-29 DIAGNOSIS — I10 Essential (primary) hypertension: Secondary | ICD-10-CM | POA: Diagnosis not present

## 2020-05-29 DIAGNOSIS — Z1331 Encounter for screening for depression: Secondary | ICD-10-CM | POA: Diagnosis not present

## 2020-05-29 DIAGNOSIS — E119 Type 2 diabetes mellitus without complications: Secondary | ICD-10-CM | POA: Diagnosis not present

## 2020-05-29 DIAGNOSIS — K219 Gastro-esophageal reflux disease without esophagitis: Secondary | ICD-10-CM | POA: Diagnosis not present

## 2020-05-29 DIAGNOSIS — J0100 Acute maxillary sinusitis, unspecified: Secondary | ICD-10-CM | POA: Diagnosis not present

## 2020-05-30 DIAGNOSIS — Z20822 Contact with and (suspected) exposure to covid-19: Secondary | ICD-10-CM | POA: Diagnosis not present

## 2020-07-03 DIAGNOSIS — K219 Gastro-esophageal reflux disease without esophagitis: Secondary | ICD-10-CM | POA: Diagnosis not present

## 2020-07-03 DIAGNOSIS — E119 Type 2 diabetes mellitus without complications: Secondary | ICD-10-CM | POA: Diagnosis not present

## 2020-07-03 DIAGNOSIS — Z20822 Contact with and (suspected) exposure to covid-19: Secondary | ICD-10-CM | POA: Diagnosis not present

## 2020-07-03 DIAGNOSIS — I10 Essential (primary) hypertension: Secondary | ICD-10-CM | POA: Diagnosis not present

## 2020-07-31 DIAGNOSIS — E1140 Type 2 diabetes mellitus with diabetic neuropathy, unspecified: Secondary | ICD-10-CM | POA: Diagnosis not present

## 2020-07-31 DIAGNOSIS — I2510 Atherosclerotic heart disease of native coronary artery without angina pectoris: Secondary | ICD-10-CM | POA: Diagnosis not present

## 2020-07-31 DIAGNOSIS — E119 Type 2 diabetes mellitus without complications: Secondary | ICD-10-CM | POA: Diagnosis not present

## 2020-07-31 DIAGNOSIS — R809 Proteinuria, unspecified: Secondary | ICD-10-CM | POA: Diagnosis not present

## 2020-07-31 DIAGNOSIS — I10 Essential (primary) hypertension: Secondary | ICD-10-CM | POA: Diagnosis not present

## 2020-08-26 DIAGNOSIS — I10 Essential (primary) hypertension: Secondary | ICD-10-CM | POA: Diagnosis not present

## 2020-08-26 DIAGNOSIS — Z6825 Body mass index (BMI) 25.0-25.9, adult: Secondary | ICD-10-CM | POA: Diagnosis not present

## 2020-08-26 DIAGNOSIS — J0100 Acute maxillary sinusitis, unspecified: Secondary | ICD-10-CM | POA: Diagnosis not present

## 2020-08-26 DIAGNOSIS — E119 Type 2 diabetes mellitus without complications: Secondary | ICD-10-CM | POA: Diagnosis not present

## 2020-09-10 DIAGNOSIS — I2510 Atherosclerotic heart disease of native coronary artery without angina pectoris: Secondary | ICD-10-CM | POA: Diagnosis not present

## 2020-09-10 DIAGNOSIS — H6123 Impacted cerumen, bilateral: Secondary | ICD-10-CM | POA: Diagnosis not present

## 2020-09-10 DIAGNOSIS — R809 Proteinuria, unspecified: Secondary | ICD-10-CM | POA: Diagnosis not present

## 2020-09-10 DIAGNOSIS — I10 Essential (primary) hypertension: Secondary | ICD-10-CM | POA: Diagnosis not present

## 2020-10-15 ENCOUNTER — Other Ambulatory Visit: Payer: Self-pay | Admitting: Cardiology

## 2021-01-21 ENCOUNTER — Other Ambulatory Visit: Payer: Self-pay | Admitting: Cardiology

## 2021-01-26 DIAGNOSIS — E119 Type 2 diabetes mellitus without complications: Secondary | ICD-10-CM | POA: Diagnosis not present

## 2021-01-26 DIAGNOSIS — I10 Essential (primary) hypertension: Secondary | ICD-10-CM | POA: Diagnosis not present

## 2021-01-26 DIAGNOSIS — I2510 Atherosclerotic heart disease of native coronary artery without angina pectoris: Secondary | ICD-10-CM | POA: Diagnosis not present

## 2021-01-26 DIAGNOSIS — R809 Proteinuria, unspecified: Secondary | ICD-10-CM | POA: Diagnosis not present

## 2021-02-17 DIAGNOSIS — R809 Proteinuria, unspecified: Secondary | ICD-10-CM | POA: Diagnosis not present

## 2021-02-17 DIAGNOSIS — M159 Polyosteoarthritis, unspecified: Secondary | ICD-10-CM | POA: Diagnosis not present

## 2021-02-17 DIAGNOSIS — E119 Type 2 diabetes mellitus without complications: Secondary | ICD-10-CM | POA: Diagnosis not present

## 2021-02-17 DIAGNOSIS — R600 Localized edema: Secondary | ICD-10-CM | POA: Diagnosis not present

## 2021-03-01 DIAGNOSIS — R809 Proteinuria, unspecified: Secondary | ICD-10-CM | POA: Diagnosis not present

## 2021-03-01 DIAGNOSIS — M7989 Other specified soft tissue disorders: Secondary | ICD-10-CM | POA: Diagnosis not present

## 2021-03-01 DIAGNOSIS — M25572 Pain in left ankle and joints of left foot: Secondary | ICD-10-CM | POA: Diagnosis not present

## 2021-03-01 DIAGNOSIS — I739 Peripheral vascular disease, unspecified: Secondary | ICD-10-CM | POA: Diagnosis not present

## 2021-03-01 DIAGNOSIS — E1140 Type 2 diabetes mellitus with diabetic neuropathy, unspecified: Secondary | ICD-10-CM | POA: Diagnosis not present

## 2021-03-01 DIAGNOSIS — I2510 Atherosclerotic heart disease of native coronary artery without angina pectoris: Secondary | ICD-10-CM | POA: Diagnosis not present

## 2021-03-02 DIAGNOSIS — E119 Type 2 diabetes mellitus without complications: Secondary | ICD-10-CM | POA: Diagnosis not present

## 2021-03-02 DIAGNOSIS — L0390 Cellulitis, unspecified: Secondary | ICD-10-CM | POA: Diagnosis not present

## 2021-03-02 DIAGNOSIS — L97509 Non-pressure chronic ulcer of other part of unspecified foot with unspecified severity: Secondary | ICD-10-CM | POA: Diagnosis not present

## 2021-03-02 DIAGNOSIS — M159 Polyosteoarthritis, unspecified: Secondary | ICD-10-CM | POA: Diagnosis not present

## 2021-03-02 DIAGNOSIS — E11621 Type 2 diabetes mellitus with foot ulcer: Secondary | ICD-10-CM | POA: Diagnosis not present

## 2021-03-04 DIAGNOSIS — M14672 Charcot's joint, left ankle and foot: Secondary | ICD-10-CM | POA: Diagnosis not present

## 2021-03-09 DIAGNOSIS — E11610 Type 2 diabetes mellitus with diabetic neuropathic arthropathy: Secondary | ICD-10-CM | POA: Diagnosis not present

## 2021-03-09 DIAGNOSIS — L97422 Non-pressure chronic ulcer of left heel and midfoot with fat layer exposed: Secondary | ICD-10-CM | POA: Diagnosis not present

## 2021-03-09 DIAGNOSIS — L97322 Non-pressure chronic ulcer of left ankle with fat layer exposed: Secondary | ICD-10-CM | POA: Diagnosis not present

## 2021-03-09 DIAGNOSIS — E11622 Type 2 diabetes mellitus with other skin ulcer: Secondary | ICD-10-CM | POA: Diagnosis not present

## 2021-03-09 DIAGNOSIS — S92812A Other fracture of left foot, initial encounter for closed fracture: Secondary | ICD-10-CM | POA: Diagnosis not present

## 2021-03-09 DIAGNOSIS — I96 Gangrene, not elsewhere classified: Secondary | ICD-10-CM | POA: Diagnosis not present

## 2021-03-09 DIAGNOSIS — X58XXXA Exposure to other specified factors, initial encounter: Secondary | ICD-10-CM | POA: Diagnosis not present

## 2021-03-09 DIAGNOSIS — E08621 Diabetes mellitus due to underlying condition with foot ulcer: Secondary | ICD-10-CM | POA: Diagnosis not present

## 2021-03-09 DIAGNOSIS — E1152 Type 2 diabetes mellitus with diabetic peripheral angiopathy with gangrene: Secondary | ICD-10-CM | POA: Diagnosis not present

## 2021-03-09 DIAGNOSIS — S92902A Unspecified fracture of left foot, initial encounter for closed fracture: Secondary | ICD-10-CM | POA: Diagnosis not present

## 2021-03-11 ENCOUNTER — Ambulatory Visit: Payer: BC Managed Care – PPO | Admitting: Podiatrist

## 2021-03-11 ENCOUNTER — Other Ambulatory Visit: Payer: Self-pay

## 2021-03-11 ENCOUNTER — Encounter: Payer: Self-pay | Admitting: Podiatrist

## 2021-03-11 DIAGNOSIS — L97523 Non-pressure chronic ulcer of other part of left foot with necrosis of muscle: Secondary | ICD-10-CM

## 2021-03-11 NOTE — Patient Instructions (Signed)
Keep applying santyl and wrapping your foot with gauze and a wrap-  wear the surgical shoe and try to avoid putting weight on your foot  I will call you when I speak with the wound care folks in [place] to decide on where to send you for the MRI.

## 2021-03-11 NOTE — Progress Notes (Signed)
Chief Complaint  Patient presents with   Foot Ulcer    Diabetic foot ulcer left foot     HPI: Patient is 65 y.o. male who presents today for a new ulcer on the left foot.  He presents with his wife.  The patient states he saw his primary care physician who recommended an xray of the foot.  While getting his xray done, the technician pointed out that he has a large ulcer on his foot that he previously was unaware of having.  Once aware, he made an appointment to be seen at a wound care center and found a quick appointment at the wound care center in [place].  An xray was taken at this facility as well and he was told there was no bone infection present.  He was prescribed Santyl and has been using it daily. He was also prescribed Doxycycline and is taking this currently.  He also saw Dr. Ophelia Charter here in [place] who recommended he follow up with [place] here at the [facility].  Milt denies any pain to his foot.  He is largely neurpathic.  He denies any fevers, chills or night sweats.  Denies any calf pain.    Patient Active Problem List   Diagnosis Date Noted   S/P CABG x 3 12/26/2017   Coronary artery disease 12/11/2017   GERD (gastroesophageal reflux disease) 12/04/2017   Controlled type 2 diabetes mellitus with neuropathy (HCC) 12/04/2017   Hypercholesterolemia 12/04/2017   Abnormal EKG 12/04/2017   Hypertension 12/04/2017    Current Outpatient Medications on File Prior to Visit  Medication Sig Dispense Refill   acetaminophen (TYLENOL) 500 MG tablet Take 2 tablets (1,000 mg total) by mouth every 6 (six) hours as needed for mild pain or fever. 30 tablet 0   aspirin EC 81 MG tablet Take 81 mg by mouth daily.     atorvastatin (LIPITOR) 40 MG tablet Take 1 tablet (40 mg total) by mouth daily at 6 PM. 90 tablet 2   calcium carbonate (TUMS - DOSED IN MG ELEMENTAL CALCIUM) 500 MG chewable tablet Chew 1-2 tablets by mouth daily as needed for indigestion or heartburn.     LANTUS 100 UNIT/ML  injection Inject 25 Units into the skin 2 (two) times daily.     lisinopril (ZESTRIL) 5 MG tablet Take 1 tablet (5 mg total) by mouth daily. Patient needs an appointment for further refills. 3 rd/final attempt 15 tablet 0   metFORMIN (GLUCOPHAGE) 500 MG tablet Take 500 mg by mouth 2 (two) times daily with a meal.     metoprolol succinate (TOPROL-XL) 50 MG 24 hr tablet Take 1 tablet (50 mg total) by mouth daily. Take with or immediately following a meal. 90 tablet 2   sitaGLIPtin (JANUVIA) 100 MG tablet Take 100 mg by mouth every morning.      No current facility-administered medications on file prior to visit.    Allergies  Allergen Reactions   Codeine Sulfate [Codeine]     UNSPECIFIED REACTION     Review of Systems No fevers, chills, nausea, muscle aches, no difficulty breathing, no calf pain, no chest pain or shortness of breath.   Physical Exam  GENERAL APPEARANCE: Alert, conversant. Appropriately groomed. No acute distress.   VASCULAR: Pedal pulses faintly  palpable DP and PT bilateral.  Capillary refill time is immediate to all digits,  Proximal to distal cooling it warm to warm.    NEUROLOGIC: sensation is absent  to 5.07 monofilament at 0/5 sites  bilateral.  Light touch is decreased bilateral, vibratory sensation decreased bilateral  MUSCULOSKELETAL: acceptable muscle strength, tone and stability right   No gross boney pedal deformities noted right.  Left foot has decrease in arch height with a prominent talar head present medially and seen clinically on exam within the wound.    DERMATOLOGIC: right foot skin is normal.  Left foot has a large, 4cm in diameter x 0.75 cm in depth circular ulceration present overlying the Talar head prominence. Talar head is also able to be seen within the wound.  Surrounding tissue is fibro-granular with a red periwound tissue.  No malodor is noted.  No active drainage within the wound is seen. See photo below.      Xrays not taken today due to  patient request.  He will get a copy of his xrays from the wound center when he goes there on Tuesday.    Assessment   Full thickness ulcer left foot with exposed bone  Plan  Discussed treatment recommendations based on the severity of the ulcer.  Discussed that since the bone is exposed,  I recommend an MRI to evaluate to see if there is any bone involvement.  I also recommended a vascular study and he relates he has one scheduled in [place] from the wound center next week.    Today I cleansed the wound with wound wash and applied Santyl and a dry, fluffy dressing and ace wrap. I dispensed a surgical shoe for him to wear vs the deck shoes he had been wearing.  He lives in [place] and I will have him see Dr. Marylene Land next Friday.  He will continue taking the doxycycline as prescribed.  And will change the dressing daily as he has been doing.

## 2021-03-13 ENCOUNTER — Emergency Department (HOSPITAL_COMMUNITY)
Admission: EM | Admit: 2021-03-13 | Discharge: 2021-03-14 | Discharge status: Against Medical Advice | Payer: BC Managed Care – PPO

## 2021-03-14 ENCOUNTER — Observation Stay (HOSPITAL_COMMUNITY): Payer: BC Managed Care – PPO

## 2021-03-14 ENCOUNTER — Emergency Department (HOSPITAL_BASED_OUTPATIENT_CLINIC_OR_DEPARTMENT_OTHER): Payer: BC Managed Care – PPO

## 2021-03-14 ENCOUNTER — Inpatient Hospital Stay (HOSPITAL_BASED_OUTPATIENT_CLINIC_OR_DEPARTMENT_OTHER)
Admission: EM | Admit: 2021-03-14 | Discharge: 2021-03-22 | DRG: 629 | Disposition: A | Discharge status: Home Health | Payer: BC Managed Care – PPO | Attending: Internal Medicine | Admitting: Internal Medicine

## 2021-03-14 ENCOUNTER — Encounter (HOSPITAL_BASED_OUTPATIENT_CLINIC_OR_DEPARTMENT_OTHER): Payer: Self-pay | Admitting: Emergency Medicine

## 2021-03-14 ENCOUNTER — Other Ambulatory Visit: Payer: Self-pay

## 2021-03-14 DIAGNOSIS — M14672 Charcot's joint, left ankle and foot: Secondary | ICD-10-CM

## 2021-03-14 DIAGNOSIS — Z7982 Long term (current) use of aspirin: Secondary | ICD-10-CM | POA: Diagnosis not present

## 2021-03-14 DIAGNOSIS — Z9889 Other specified postprocedural states: Secondary | ICD-10-CM | POA: Diagnosis not present

## 2021-03-14 DIAGNOSIS — Z833 Family history of diabetes mellitus: Secondary | ICD-10-CM | POA: Diagnosis not present

## 2021-03-14 DIAGNOSIS — Z8249 Family history of ischemic heart disease and other diseases of the circulatory system: Secondary | ICD-10-CM | POA: Diagnosis not present

## 2021-03-14 DIAGNOSIS — R509 Fever, unspecified: Secondary | ICD-10-CM | POA: Diagnosis not present

## 2021-03-14 DIAGNOSIS — Z7984 Long term (current) use of oral hypoglycemic drugs: Secondary | ICD-10-CM

## 2021-03-14 DIAGNOSIS — N179 Acute kidney failure, unspecified: Secondary | ICD-10-CM | POA: Diagnosis not present

## 2021-03-14 DIAGNOSIS — E11610 Type 2 diabetes mellitus with diabetic neuropathic arthropathy: Secondary | ICD-10-CM | POA: Diagnosis present

## 2021-03-14 DIAGNOSIS — S92002A Unspecified fracture of left calcaneus, initial encounter for closed fracture: Secondary | ICD-10-CM | POA: Diagnosis not present

## 2021-03-14 DIAGNOSIS — I252 Old myocardial infarction: Secondary | ICD-10-CM | POA: Diagnosis not present

## 2021-03-14 DIAGNOSIS — Z794 Long term (current) use of insulin: Secondary | ICD-10-CM | POA: Diagnosis not present

## 2021-03-14 DIAGNOSIS — L97529 Non-pressure chronic ulcer of other part of left foot with unspecified severity: Secondary | ICD-10-CM | POA: Diagnosis not present

## 2021-03-14 DIAGNOSIS — Z20822 Contact with and (suspected) exposure to covid-19: Secondary | ICD-10-CM | POA: Diagnosis not present

## 2021-03-14 DIAGNOSIS — I2510 Atherosclerotic heart disease of native coronary artery without angina pectoris: Secondary | ICD-10-CM | POA: Diagnosis not present

## 2021-03-14 DIAGNOSIS — M86172 Other acute osteomyelitis, left ankle and foot: Secondary | ICD-10-CM | POA: Diagnosis not present

## 2021-03-14 DIAGNOSIS — E11621 Type 2 diabetes mellitus with foot ulcer: Secondary | ICD-10-CM | POA: Diagnosis not present

## 2021-03-14 DIAGNOSIS — M24375 Pathological dislocation of left foot, not elsewhere classified: Secondary | ICD-10-CM | POA: Diagnosis not present

## 2021-03-14 DIAGNOSIS — S91302A Unspecified open wound, left foot, initial encounter: Secondary | ICD-10-CM | POA: Diagnosis not present

## 2021-03-14 DIAGNOSIS — M869 Osteomyelitis, unspecified: Secondary | ICD-10-CM | POA: Diagnosis not present

## 2021-03-14 DIAGNOSIS — M1990 Unspecified osteoarthritis, unspecified site: Secondary | ICD-10-CM | POA: Diagnosis present

## 2021-03-14 DIAGNOSIS — Q2112 Patent foramen ovale: Secondary | ICD-10-CM | POA: Diagnosis not present

## 2021-03-14 DIAGNOSIS — K219 Gastro-esophageal reflux disease without esophagitis: Secondary | ICD-10-CM | POA: Diagnosis present

## 2021-03-14 DIAGNOSIS — L97424 Non-pressure chronic ulcer of left heel and midfoot with necrosis of bone: Secondary | ICD-10-CM | POA: Diagnosis not present

## 2021-03-14 DIAGNOSIS — S93315A Dislocation of tarsal joint of left foot, initial encounter: Secondary | ICD-10-CM | POA: Diagnosis not present

## 2021-03-14 DIAGNOSIS — Z79899 Other long term (current) drug therapy: Secondary | ICD-10-CM

## 2021-03-14 DIAGNOSIS — Z951 Presence of aortocoronary bypass graft: Secondary | ICD-10-CM | POA: Diagnosis not present

## 2021-03-14 DIAGNOSIS — I10 Essential (primary) hypertension: Secondary | ICD-10-CM | POA: Diagnosis not present

## 2021-03-14 DIAGNOSIS — E1169 Type 2 diabetes mellitus with other specified complication: Secondary | ICD-10-CM | POA: Diagnosis not present

## 2021-03-14 DIAGNOSIS — T8131XA Disruption of external operation (surgical) wound, not elsewhere classified, initial encounter: Secondary | ICD-10-CM | POA: Diagnosis not present

## 2021-03-14 DIAGNOSIS — D6489 Other specified anemias: Secondary | ICD-10-CM | POA: Diagnosis not present

## 2021-03-14 DIAGNOSIS — E1165 Type 2 diabetes mellitus with hyperglycemia: Secondary | ICD-10-CM | POA: Diagnosis present

## 2021-03-14 DIAGNOSIS — E785 Hyperlipidemia, unspecified: Secondary | ICD-10-CM | POA: Diagnosis present

## 2021-03-14 DIAGNOSIS — M7989 Other specified soft tissue disorders: Secondary | ICD-10-CM | POA: Diagnosis not present

## 2021-03-14 DIAGNOSIS — S81802A Unspecified open wound, left lower leg, initial encounter: Secondary | ICD-10-CM | POA: Diagnosis not present

## 2021-03-14 DIAGNOSIS — B957 Other staphylococcus as the cause of diseases classified elsewhere: Secondary | ICD-10-CM | POA: Diagnosis present

## 2021-03-14 DIAGNOSIS — L97524 Non-pressure chronic ulcer of other part of left foot with necrosis of bone: Secondary | ICD-10-CM | POA: Diagnosis not present

## 2021-03-14 DIAGNOSIS — L0390 Cellulitis, unspecified: Secondary | ICD-10-CM | POA: Diagnosis not present

## 2021-03-14 LAB — CBC WITH DIFFERENTIAL/PLATELET
Abs Immature Granulocytes: 0.06 10*3/uL (ref 0.00–0.07)
Basophils Absolute: 0 10*3/uL (ref 0.0–0.1)
Basophils Relative: 0 %
Eosinophils Absolute: 0.1 10*3/uL (ref 0.0–0.5)
Eosinophils Relative: 1 %
HCT: 35.9 % — ABNORMAL LOW (ref 39.0–52.0)
Hemoglobin: 11.8 g/dL — ABNORMAL LOW (ref 13.0–17.0)
Immature Granulocytes: 1 %
Lymphocytes Relative: 12 %
Lymphs Abs: 1.4 10*3/uL (ref 0.7–4.0)
MCH: 30.8 pg (ref 26.0–34.0)
MCHC: 32.9 g/dL (ref 30.0–36.0)
MCV: 93.7 fL (ref 80.0–100.0)
Monocytes Absolute: 1.2 10*3/uL — ABNORMAL HIGH (ref 0.1–1.0)
Monocytes Relative: 10 %
Neutro Abs: 9.1 10*3/uL — ABNORMAL HIGH (ref 1.7–7.7)
Neutrophils Relative %: 76 %
Platelets: 263 10*3/uL (ref 150–400)
RBC: 3.83 MIL/uL — ABNORMAL LOW (ref 4.22–5.81)
RDW: 12.9 % (ref 11.5–15.5)
WBC: 11.9 10*3/uL — ABNORMAL HIGH (ref 4.0–10.5)
nRBC: 0 % (ref 0.0–0.2)

## 2021-03-14 LAB — BASIC METABOLIC PANEL
Anion gap: 8 (ref 5–15)
BUN: 36 mg/dL — ABNORMAL HIGH (ref 8–23)
CO2: 24 mmol/L (ref 22–32)
Calcium: 8.6 mg/dL — ABNORMAL LOW (ref 8.9–10.3)
Chloride: 101 mmol/L (ref 98–111)
Creatinine, Ser: 1.33 mg/dL — ABNORMAL HIGH (ref 0.61–1.24)
GFR, Estimated: >60 mL/min (ref >60)
Glucose, Bld: 238 mg/dL — ABNORMAL HIGH (ref 70–99)
Potassium: 4.3 mmol/L (ref 3.5–5.1)
Sodium: 133 mmol/L — ABNORMAL LOW (ref 135–145)

## 2021-03-14 LAB — GLUCOSE, CAPILLARY
Glucose-Capillary: 145 mg/dL — ABNORMAL HIGH (ref 70–99)
Glucose-Capillary: 214 mg/dL — ABNORMAL HIGH (ref 70–99)
Glucose-Capillary: 176 mg/dL — ABNORMAL HIGH (ref 70–99)

## 2021-03-14 LAB — RESP PANEL BY RT-PCR (FLU A&B, COVID) ARPGX2
Influenza A by PCR: NEGATIVE
Influenza B by PCR: NEGATIVE
SARS Coronavirus 2 by RT PCR: NEGATIVE

## 2021-03-14 LAB — C-REACTIVE PROTEIN: CRP: 6 mg/dL — ABNORMAL HIGH (ref <1.0)

## 2021-03-14 LAB — SEDIMENTATION RATE: Sed Rate: 50 mm/hr — ABNORMAL HIGH (ref 0–16)

## 2021-03-14 MED ORDER — VANCOMYCIN HCL 10 G IV SOLR
1500.0000 mg | Freq: Once | INTRAVENOUS | Status: AC
  Administered 2021-03-14: 1500 mg via INTRAVENOUS
  Filled 2021-03-14: qty 15

## 2021-03-14 MED ORDER — INSULIN ASPART 100 UNIT/ML IJ SOLN
0.0000 [IU] | Freq: Three times a day (TID) | INTRAMUSCULAR | Status: DC
  Administered 2021-03-14: 5 [IU] via SUBCUTANEOUS
  Administered 2021-03-14: 2 [IU] via SUBCUTANEOUS
  Administered 2021-03-15 (×2): 3 [IU] via SUBCUTANEOUS
  Administered 2021-03-15: 8 [IU] via SUBCUTANEOUS
  Administered 2021-03-16: 3 [IU] via SUBCUTANEOUS
  Administered 2021-03-16: 5 [IU] via SUBCUTANEOUS
  Administered 2021-03-17: 3 [IU] via SUBCUTANEOUS
  Administered 2021-03-17: 5 [IU] via SUBCUTANEOUS
  Administered 2021-03-17: 2 [IU] via SUBCUTANEOUS
  Administered 2021-03-18: 3 [IU] via SUBCUTANEOUS
  Administered 2021-03-18: 4 [IU] via SUBCUTANEOUS
  Administered 2021-03-18: 8 [IU] via SUBCUTANEOUS
  Administered 2021-03-19: 4 [IU] via SUBCUTANEOUS
  Administered 2021-03-19: 3 [IU] via SUBCUTANEOUS
  Administered 2021-03-20 (×2): 5 [IU] via SUBCUTANEOUS
  Administered 2021-03-20: 8 [IU] via SUBCUTANEOUS
  Administered 2021-03-21: 5 [IU] via SUBCUTANEOUS
  Administered 2021-03-21: 11 [IU] via SUBCUTANEOUS
  Administered 2021-03-21: 5 [IU] via SUBCUTANEOUS
  Administered 2021-03-22: 11 [IU] via SUBCUTANEOUS
  Administered 2021-03-22: 5 [IU] via SUBCUTANEOUS
  Administered 2021-03-22: 8 [IU] via SUBCUTANEOUS
  Filled 2021-03-14: qty 1

## 2021-03-14 MED ORDER — ONDANSETRON HCL 4 MG PO TABS: 4.0000 mg | ORAL_TABLET | Freq: Four times a day (QID) | ORAL | Status: DC | PRN

## 2021-03-14 MED ORDER — VANCOMYCIN HCL 500 MG IV SOLR
INTRAVENOUS | Status: AC
  Filled 2021-03-14: qty 10

## 2021-03-14 MED ORDER — PIPERACILLIN-TAZOBACTAM 3.375 G IVPB
3.3750 g | Freq: Three times a day (TID) | INTRAVENOUS | Status: DC
  Filled 2021-03-14: qty 50

## 2021-03-14 MED ORDER — SODIUM CHLORIDE 0.9 % IV BOLUS
1000.0000 mL | Freq: Once | INTRAVENOUS | Status: AC
  Administered 2021-03-14: 1000 mL via INTRAVENOUS

## 2021-03-14 MED ORDER — SODIUM CHLORIDE 0.9 % IV SOLN
2.0000 g | Freq: Three times a day (TID) | INTRAVENOUS | Status: DC
  Administered 2021-03-14 – 2021-03-21 (×21): 2 g via INTRAVENOUS
  Filled 2021-03-14 (×25): qty 2

## 2021-03-14 MED ORDER — ACETAMINOPHEN 650 MG RE SUPP: 650.0000 mg | Freq: Four times a day (QID) | RECTAL | Status: DC | PRN

## 2021-03-14 MED ORDER — VANCOMYCIN HCL 1000 MG IV SOLR
INTRAVENOUS | Status: AC
  Filled 2021-03-14: qty 20

## 2021-03-14 MED ORDER — VANCOMYCIN HCL 1750 MG/350ML IV SOLN
1750.0000 mg | INTRAVENOUS | Status: DC
  Administered 2021-03-15 – 2021-03-16 (×2): 1750 mg via INTRAVENOUS
  Filled 2021-03-14 (×3): qty 350

## 2021-03-14 MED ORDER — ACETAMINOPHEN 325 MG PO TABS
650.0000 mg | ORAL_TABLET | Freq: Four times a day (QID) | ORAL | Status: DC | PRN
  Administered 2021-03-14 – 2021-03-21 (×6): 650 mg via ORAL
  Filled 2021-03-14 (×6): qty 2

## 2021-03-14 MED ORDER — SODIUM CHLORIDE 0.9 % IV SOLN
2.0000 g | Freq: Once | INTRAVENOUS | Status: AC
  Administered 2021-03-14: 2 g via INTRAVENOUS
  Filled 2021-03-14: qty 20

## 2021-03-14 MED ORDER — INSULIN ASPART 100 UNIT/ML IJ SOLN
0.0000 [IU] | Freq: Every day | INTRAMUSCULAR | Status: DC
  Administered 2021-03-15: 5 [IU] via SUBCUTANEOUS
  Administered 2021-03-16: 2 [IU] via SUBCUTANEOUS
  Administered 2021-03-18: 3 [IU] via SUBCUTANEOUS
  Administered 2021-03-19: 2 [IU] via SUBCUTANEOUS
  Administered 2021-03-20: 4 [IU] via SUBCUTANEOUS
  Administered 2021-03-21: 3 [IU] via SUBCUTANEOUS

## 2021-03-14 MED ORDER — ENOXAPARIN SODIUM 40 MG/0.4ML IJ SOSY
40.0000 mg | PREFILLED_SYRINGE | INTRAMUSCULAR | Status: DC
  Administered 2021-03-14 – 2021-03-22 (×7): 40 mg via SUBCUTANEOUS
  Filled 2021-03-14 (×7): qty 0.4

## 2021-03-14 MED ORDER — ONDANSETRON HCL 4 MG/2ML IJ SOLN: 4.0000 mg | Freq: Four times a day (QID) | INTRAMUSCULAR | Status: DC | PRN

## 2021-03-14 MED ORDER — SODIUM CHLORIDE 0.9 % IV SOLN
INTRAVENOUS | Status: DC | PRN
Start: 2021-03-14 — End: 2021-03-23
  Administered 2021-03-15: 1000 mL via INTRAVENOUS
  Administered 2021-03-20: 500 mL via INTRAVENOUS

## 2021-03-14 MED ORDER — VANCOMYCIN HCL 1500 MG/300ML IV SOLN: 1500.0000 mg | Freq: Once | INTRAVENOUS | Status: DC

## 2021-03-14 MED ORDER — SODIUM CHLORIDE 0.9 % IV SOLN: Freq: Once | INTRAVENOUS | Status: AC

## 2021-03-14 MED ORDER — METRONIDAZOLE 500 MG/100ML IV SOLN
500.0000 mg | Freq: Two times a day (BID) | INTRAVENOUS | Status: DC
  Administered 2021-03-14 – 2021-03-20 (×14): 500 mg via INTRAVENOUS
  Filled 2021-03-14 (×15): qty 100

## 2021-03-14 NOTE — Progress Notes (Signed)
Attempted to completed ABI exam, however patient is in MRI for another test.  Will re-attempt as schedule permits.   Results can be found under chart review under CV PROC. 03/14/2021 11:51 AM Zakyra K[place] RVT, RDMS

## 2021-03-14 NOTE — Plan of Care (Signed)
  Problem: Education: Goal: Knowledge of General Education information will improve Description Including pain rating scale, medication(s)/side effects and non-pharmacologic comfort measures Outcome: Progressing   

## 2021-03-14 NOTE — Consult Note (Signed)
Reason for Consult: left foot infection Referring Physician: Margie Ege, DO  [name] is an 65 y.o. male.  HPI: He has a history of type 2 diabetes, recently developed a wound on the medial side of the left foot about 2 weeks ago.  He also noticed that his arch has progressively collapsed very quickly.  He saw Dr. Donzetta Kohut in our office on Thursday and she had ordered an MRI and put him on doxycycline.  He developed a fever yesterday and felt the on call line and I recommended he go to the ER for admission for IV antibiotics.  MRI is completed today.  Has been having some pain in the foot  Past Medical History:  Diagnosis Date   Arthritis    Coronary artery disease    Diabetes mellitus without complication (HCC)    Type II   GERD (gastroesophageal reflux disease)    Myocardial infarction [facility])    "Silent"   Neuropathy     Past Surgical History:  Procedure Laterality Date   CATARACT EXTRACTION Bilateral    COLONOSCOPY W/ POLYPECTOMY     CORONARY ARTERY BYPASS GRAFT N/A 12/26/2017   Procedure: CORONARY ARTERY BYPASS GRAFTING (CABG) x 3 Using Left Internal Mammary Artery (LIMA) and Edoscopically Harvested Left Leg Greater Saphenous Vein Graft (SVG); -LIMA to LAD, -SVG to LEFT CIRCUMFLEX, -SVG to PDA,;  Surgeon: Kerin Perna, MD;  Location: [facility] OR;  Service: Open Heart Surgery;  Laterality: N/A;   ENDOVEIN HARVEST OF GREATER SAPHENOUS VEIN Left 12/26/2017   Procedure: ENDOVEIN HARVEST OF GREATER SAPHENOUS VEIN: Left Thigh to below the knee;  Surgeon: Kerin Perna, MD;  Location: [facility] OR;  Service: Open Heart Surgery;  Laterality: Left;   INTRAVASCULAR PRESSURE WIRE/FFR STUDY N/A 12/19/2017   Procedure: INTRAVASCULAR PRESSURE WIRE/FFR STUDY;  Surgeon: Lyn Records, MD;  [facility] INV[facility] LAB;  Service: Cardiovascular;  Laterality: N/A;   LEFT HEART CATH AND CORONARY ANGIOGRAPHY N/A 12/19/2017   Procedure: LEFT HEART CATH AND CORONARY ANGIOGRAPHY;  Surgeon: Lyn Records,  MD;  Location: [facility] INVASIVE CV LAB;  Service: Cardiovascular;  Laterality: N/A;   TEE WITHOUT CARDIOVERSION N/A 12/26/2017   Procedure: TRANSESOPHAGEAL ECHOCARDIOGRAM (TEE);  Surgeon: Donata Clay, Theron Arista, MD;  Location: [facility] OR;  Service: Open Heart Surgery;  Laterality: N/A;   VASECTOMY      Family History  Problem Relation Age of Onset   Diabetes Mother    Diabetes Father    Heart disease Father    Diabetes Daughter     Social History:  reports that he has never smoked. He has never used smokeless tobacco. He reports that he does not currently use alcohol. No history on file for drug use.  Allergies:  Allergies  Allergen Reactions   Codeine Sulfate [Codeine]     UNSPECIFIED REACTION     Medications: I have reviewed the patient's current medications.  Results for orders placed or performed during the hospital encounter of 03/14/21 (from the past 48 hour(s))  CBC with Differential     Status: Abnormal   Collection Time: 03/14/21 12:40 AM  Result Value Ref Range   WBC 11.9 (H) 4.0 - 10.5 K/uL   RBC 3.83 (L) 4.22 - 5.81 MIL/uL   Hemoglobin 11.8 (L) 13.0 - 17.0 g/dL   HCT 46.6 (L) 59.9 - 35.7 %   MCV 93.7 80.0 - 100.0 fL   MCH 30.8 26.0 - 34.0 pg   MCHC 32.9 30.0 - 36.0 g/dL  RDW 12.9 11.5 - 15.5 %   Platelets 263 150 - 400 K/uL   nRBC 0.0 0.0 - 0.2 %   Neutrophils Relative % 76 %   Neutro Abs 9.1 (H) 1.7 - 7.7 K/uL   Lymphocytes Relative 12 %   Lymphs Abs 1.4 0.7 - 4.0 K/uL   Monocytes Relative 10 %   Monocytes Absolute 1.2 (H) 0.1 - 1.0 K/uL   Eosinophils Relative 1 %   Eosinophils Absolute 0.1 0.0 - 0.5 K/uL   Basophils Relative 0 %   Basophils Absolute 0.0 0.0 - 0.1 K/uL   Immature Granulocytes 1 %   Abs Immature Granulocytes 0.06 0.00 - 0.07 K/uL    Comment: Performed at [facility], [address], [place], [place] [address]  Basic metabolic panel     Status: Abnormal   Collection Time: 03/14/21 12:40 AM  Result Value Ref Range   Sodium 133 (L)  135 - 145 mmol/L   Potassium 4.3 3.5 - 5.1 mmol/L   Chloride 101 98 - 111 mmol/L   CO2 24 22 - 32 mmol/L   Glucose, Bld 238 (H) 70 - 99 mg/dL    Comment: Glucose reference range applies only to samples taken after fasting for at least 8 hours.   BUN 36 (H) 8 - 23 mg/dL   Creatinine, Ser 6.96 (H) 0.61 - 1.24 mg/dL   Calcium 8.6 (L) 8.9 - 10.3 mg/dL   GFR, Estimated >78 >93 mL/min    Comment: (NOTE) Calculated using the CKD-EPI Creatinine Equation (2021)    Anion gap 8 5 - 15    Comment: Performed at [facility], [address] [place], [place] [address]  Sedimentation rate     Status: Abnormal   Collection Time: 03/14/21 12:40 AM  Result Value Ref Range   Sed Rate 50 (H) 0 - 16 mm/hr    Comment: Performed at [facility], [address] [place], [place] [address]  Resp Panel by RT-PCR (Flu A&B, Covid) Nasopharyngeal Swab     Status: None   Collection Time: 03/14/21  1:10 AM   Specimen: Nasopharyngeal Swab; Nasopharyngeal(NP) swabs in vial transport medium  Result Value Ref Range   SARS Coronavirus 2 by RT PCR NEGATIVE NEGATIVE    Comment: (NOTE) SARS-CoV-2 target nucleic acids are NOT DETECTED.  The SARS-CoV-2 RNA is generally detectable in upper respiratory specimens during the acute phase of infection. The lowest concentration of SARS-CoV-2 viral copies this assay can detect is 138 copies/mL. A negative result does not preclude SARS-Cov-2 infection and should not be used as the sole basis for treatment or other patient management decisions. A negative result may occur with  improper specimen collection/handling, submission of specimen other than nasopharyngeal swab, presence of viral mutation(s) within the areas targeted by this assay, and inadequate number of viral copies(<138 copies/mL). A negative result must be combined with clinical observations, patient history, and epidemiological information. The expected result is  Negative.  Fact Sheet for Patients:  BloggerCourse.com  Fact Sheet for Healthcare Providers:  SeriousBroker.it  This test is no t yet approved or cleared by the [place] FDA and  has been authorized for detection and/or diagnosis of SARS-CoV-2 by FDA under an Emergency Use Authorization (EUA). This EUA will remain  in effect (meaning this test can be used) for the duration of the COVID-19 declaration under Section 564(b)(1) of the Act, 21 U.S.C.section 360bbb-3(b)(1), unless the authorization is terminated  or revoked sooner.  Influenza A by PCR NEGATIVE NEGATIVE   Influenza B by PCR NEGATIVE NEGATIVE    Comment: (NOTE) The Xpert Xpress SARS-CoV-2/FLU/RSV plus assay is intended as an aid in the diagnosis of influenza from Nasopharyngeal swab specimens and should not be used as a sole basis for treatment. Nasal washings and aspirates are unacceptable for Xpert Xpress SARS-CoV-2/FLU/RSV testing.  Fact Sheet for Patients: BloggerCourse.com  Fact Sheet for Healthcare Providers: SeriousBroker.it  This test is not yet approved or cleared by the [place] FDA and has been authorized for detection and/or diagnosis of SARS-CoV-2 by FDA under an Emergency Use Authorization (EUA). This EUA will remain in effect (meaning this test can be used) for the duration of the COVID-19 declaration under Section 564(b)(1) of the Act, 21 U.S.C. section 360bbb-3(b)(1), unless the authorization is terminated or revoked.  Performed at [facility], [address] [place], [place] [address]   Glucose, capillary     Status: Abnormal   Collection Time: 03/14/21 11:17 AM  Result Value Ref Range   Glucose-Capillary 145 (H) 70 - 99 mg/dL    Comment: Glucose reference range applies only to samples taken after fasting for at least 8 hours.  Glucose, capillary     Status:  Abnormal   Collection Time: 03/14/21  4:58 PM  Result Value Ref Range   Glucose-Capillary 214 (H) 70 - 99 mg/dL    Comment: Glucose reference range applies only to samples taken after fasting for at least 8 hours.    MR FOOT LEFT WO CONTRAST  Result Date: 03/14/2021 CLINICAL DATA:  Medial foot ulcer. EXAM: MRI OF THE LEFT FOOT WITHOUT CONTRAST TECHNIQUE: Multiplanar, multisequence MR imaging of the left ankle was performed. No intravenous contrast was administered. COMPARISON:  Left foot x-rays from same day. Left ankle x-rays dated March 01, 2021. FINDINGS: Bones/Joint/Cartilage Medial dislocation of the navicular with respect to the kidney a forms, as well as the calcaneus with respect to the cuboid. Mild impaction fracture of the anterolateral calcaneus (series 8, image 18). Patchy marrow edema involving the calcaneus, cuboid, cuneiforms, and navicular, likely related to neuropathic arthropathy. No joint effusion. Ligaments Lisfranc ligament is intact. Muscles and Tendons Flexor and extensor tendons are intact. Split tear of the peroneal brevis tendon (series 5, image 17). Intact Achilles tendon with mild tendinosis. Soft tissue Soft tissue ulceration of the medial midfoot overlying the dislocated navicular. Diffuse soft tissue swelling. No fluid collection or hematoma. No soft tissue mass. IMPRESSION: 1. 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 2. Soft tissue ulceration of the medial midfoot overlying the dislocated navicular, extending to bone. Probable early osteomyelitis of the medial navicular given exposed bone. No abscess. 3. Impaction fracture of the anterolateral calcaneus. 4. Split tear of the peroneal brevis tendon. Electronically Signed   By: Obie Dredge M.D.   On: 03/14/2021 12:50   DG Foot Complete Left  Result Date: 03/14/2021 CLINICAL DATA:  Left foot wound medially with concern for possible osteo myelitis. EXAM: LEFT FOOT - COMPLETE  3+ VIEW COMPARISON:  Ankle film from 03/01/2021 FINDINGS: There are changes consistent with dislocation of the cuneiforms and cuboid with respect to the tarsal navicular bone, talus and calcaneus. The bony prominence medially relates to the navicular bone secondary to the dislocation. These changes were present on the prior exam. Soft tissue wound is noted over the navicular bone consistent with the given clinical history. No discrete bony erosive changes are identified to suggest osteomyelitis. No acute fracture is noted. IMPRESSION:  Changes consistent with dislocation between the cuneiforms and tarsal navicular bone. This creates a bony prominence medially which corresponds to the soft tissue wound. No discrete bony erosive changes are noted to suggest osteomyelitis. Electronically Signed   By: Alcide Clever M.D.   On: 03/14/2021 01:15    Review of Systems  Constitutional:  Positive for fever.  Musculoskeletal:  Positive for joint pain.  Skin:        Ulcer left foot  All other systems reviewed and are negative. Blood pressure 128/71, pulse 88, temperature 100 F (37.8 C), temperature source Oral, resp. rate 18, height 6\' 2"  (1.88 m), weight 84.7 kg, SpO2 98 %.  Vitals:   03/14/21 1457 03/14/21 1709  BP: 128/71   Pulse: 88   Resp: 18   Temp: 99.9 F (37.7 C) 100 F (37.8 C)  SpO2: 98%     General AA&O x3. Normal mood and affect.  Vascular Dorsalis pedis and posterior tibial pulses  present 1+ bilaterally  Capillary refill normal to all digits. Pedal hair growth normal.  Neurologic Epicritic sensation grossly absent.  Dermatologic (Wound) Full-thickness ulcer on medial midfoot with fibronecrotic wound base, malodor and drainage with periwound erythema and cellulitis.  Orthopedic: Motor intact BLE.  Gross abduction deformity of the midfoot with prominent bone medially      Assessment/Plan:  Charcot joint dislocation and ulceration with osteomyelitis and cellulitis -Imaging: Studies  independently reviewed -Antibiotics: Continue broad-spectrum for now tissue cultures will be taken intraoperatively -WB Status: NWB to LLE -Wound Care: Foot redressed today further wound care orders to be placed following surgery tomorrow -Surgical Plan: Plan for surgery tomorrow with Dr. 05/14/21, I have discussed this case with him.  He will likely require staged surgical intervention with wound debridement biopsies and likely reduction and percutaneous pinning.  I will let Dr. Samuella Cota decide on his further surgical plan of care.  Samuella Cota 03/14/2021, 6:32 PM   Best available via secure chat for questions or concerns.

## 2021-03-14 NOTE — ED Provider Notes (Signed)
[facility] EMERGENCY DEPT Provider Note   CSN: [id number] Arrival date & time: 03/14/21  0027     History Chief Complaint  Patient presents with   Wound Infection   Foot Pain    [name] is a 65 y.o. male.  65 yo M with a chief complaints of a wound to the left foot.  The patient has Charcot foot, has been seeing podiatry.  Is developed a wound over the past couple months.  Last seen by the podiatrist a few days ago with concern that they could palpate the bone.  Plain film without obvious infection.  Scheduled an outpatient MRI.  Also had scheduled outpatient vascular studies.  Patient developed a fever over the past 24 hours.  As high as 101 at home.  Call their podiatrist who suggested they come to the ED to be admitted for concern for osteomyelitis.  The history is provided by the patient and a relative.  Foot Pain This is a new problem. The current episode started more than 2 days ago. The problem occurs constantly. The problem has been gradually worsening. Pertinent negatives include no chest pain, no abdominal pain, no headaches and no shortness of breath. Nothing aggravates the symptoms. Nothing relieves the symptoms. He has tried nothing for the symptoms. The treatment provided no relief.      Past Medical History:  Diagnosis Date   Arthritis    Coronary artery disease    Diabetes mellitus without complication (HCC)    Type II   GERD (gastroesophageal reflux disease)    Myocardial infarction [facility])    "Silent"   Neuropathy     Patient Active Problem List   Diagnosis Date Noted   S/P CABG x 3 12/26/2017   Coronary artery disease 12/11/2017   GERD (gastroesophageal reflux disease) 12/04/2017   Controlled type 2 diabetes mellitus with neuropathy (HCC) 12/04/2017   Hypercholesterolemia 12/04/2017   Abnormal EKG 12/04/2017   Hypertension 12/04/2017    Past Surgical History:  Procedure Laterality Date   CATARACT EXTRACTION Bilateral     COLONOSCOPY W/ POLYPECTOMY     CORONARY ARTERY BYPASS GRAFT N/A 12/26/2017   Procedure: CORONARY ARTERY BYPASS GRAFTING (CABG) x 3 Using Left Internal Mammary Artery (LIMA) and Edoscopically Harvested Left Leg Greater Saphenous Vein Graft (SVG); -LIMA to LAD, -SVG to LEFT CIRCUMFLEX, -SVG to PDA,;  Surgeon: Kerin Perna, MD;  Location: [facility] OR;  Service: Open Heart Surgery;  Laterality: N/A;   ENDOVEIN HARVEST OF GREATER SAPHENOUS VEIN Left 12/26/2017   Procedure: ENDOVEIN HARVEST OF GREATER SAPHENOUS VEIN: Left Thigh to below the knee;  Surgeon: Kerin Perna, MD;  Location: [facility] OR;  Service: Open Heart Surgery;  Laterality: Left;   INTRAVASCULAR PRESSURE WIRE/FFR STUDY N/A 12/19/2017   Procedure: INTRAVASCULAR PRESSURE WIRE/FFR STUDY;  Surgeon: Lyn Records, MD;  [facility] LAB;  Service: Cardiovascular;  Laterality: N/A;   LEFT HEART CATH AND CORONARY ANGIOGRAPHY N/A 12/19/2017   Procedure: LEFT HEART CATH AND CORONARY ANGIOGRAPHY;  Surgeon: Lyn Records, MD;  [facility];  Service: Cardiovascular;  Laterality: N/A;   TEE WITHOUT CARDIOVERSION N/A 12/26/2017   Procedure: TRANSESOPHAGEAL ECHOCARDIOGRAM (TEE);  Surgeon: Donata Clay, Theron Arista, MD;  Location: [facility] OR;  Service: Open Heart Surgery;  Laterality: N/A;   VASECTOMY         Family History  Problem Relation Age of Onset   Diabetes Mother    Diabetes Father    Heart disease Father  Diabetes Daughter     Social History   Tobacco Use   Smoking status: Never   Smokeless tobacco: Never  Substance Use Topics   Alcohol use: Not Currently    Comment: occasional- 1 beer every 3 monthly    Home Medications Prior to Admission medications   Medication Sig Start Date End Date Taking? Authorizing Provider  acetaminophen (TYLENOL) 500 MG tablet Take 2 tablets (1,000 mg total) by mouth every 6 (six) hours as needed for mild pain or fever. 12/30/17   Barrett, Rae Roam, PA-C  aspirin EC 81 MG tablet Take 81 mg by  mouth daily.    [provider]  atorvastatin (LIPITOR) 40 MG tablet Take 1 tablet (40 mg total) by mouth daily at 6 PM. 12/11/17 06/20/19  Georgeanna Lea, MD  calcium carbonate (TUMS - DOSED IN MG ELEMENTAL CALCIUM) 500 MG chewable tablet Chew 1-2 tablets by mouth daily as needed for indigestion or heartburn.    [provider]  LANTUS 100 UNIT/ML injection Inject 25 Units into the skin 2 (two) times daily. 10/12/18   [provider]  lisinopril (ZESTRIL) 5 MG tablet Take 1 tablet (5 mg total) by mouth daily. Patient needs an appointment for further refills. 3 rd/final attempt 01/21/21   Georgeanna Lea, MD  metFORMIN (GLUCOPHAGE) 500 MG tablet Take 500 mg by mouth 2 (two) times daily with a meal.    [provider]  metoprolol succinate (TOPROL-XL) 50 MG 24 hr tablet Take 1 tablet (50 mg total) by mouth daily. Take with or immediately following a meal. 01/30/18 06/20/19  Georgeanna Lea, MD  sitaGLIPtin (JANUVIA) 100 MG tablet Take 100 mg by mouth every morning.     [provider]    Allergies    Codeine sulfate [codeine]  Review of Systems   Review of Systems  Constitutional:  Negative for chills and fever.  HENT:  Negative for congestion and facial swelling.   Eyes:  Negative for discharge and visual disturbance.  Respiratory:  Negative for shortness of breath.   Cardiovascular:  Negative for chest pain and palpitations.  Gastrointestinal:  Negative for abdominal pain, diarrhea and vomiting.  Musculoskeletal:  Negative for arthralgias and myalgias.  Skin:  Positive for wound. Negative for color change and rash.  Neurological:  Negative for tremors, syncope and headaches.  Psychiatric/Behavioral:  Negative for confusion and dysphoric mood.    Physical Exam Updated Vital Signs BP 127/66   Pulse 86   Temp 99 F (37.2 C) (Oral)   Resp 18   Ht 6\' 2"  (1.88 m)   Wt 84.7 kg   SpO2 100%   BMI 23.97 kg/m   Physical Exam Vitals and  nursing note reviewed.  Constitutional:      Appearance: He is well-developed.  HENT:     Head: Normocephalic and atraumatic.  Eyes:     Pupils: Pupils are equal, round, and reactive to light.  Neck:     Vascular: No JVD.  Cardiovascular:     Rate and Rhythm: Normal rate and regular rhythm.     Heart sounds: No murmur heard.   No friction rub. No gallop.  Pulmonary:     Effort: No respiratory distress.     Breath sounds: No wheezing.  Abdominal:     General: There is no distension.     Tenderness: There is no abdominal tenderness. There is no guarding or rebound.  Musculoskeletal:        General: Normal range  of motion.     Cervical back: Normal range of motion and neck supple.     Comments: Wound along the medial aspect of the left foot.  About the size of a silver dollar with exposed bone.  Granulation tissue surrounding some surrounding erythema as well.  No obvious induration fluctuance or drainage.  Foot is warm and well-perfused.  Cap refill less than 2 seconds.  Pulses are palpable.  Skin:    Coloration: Skin is not pale.     Findings: No rash.  Neurological:     Mental Status: He is alert and oriented to person, place, and time.  Psychiatric:        Behavior: Behavior normal.    ED Results / Procedures / Treatments   Labs (all labs ordered are listed, but only abnormal results are displayed) Labs Reviewed  CBC WITH DIFFERENTIAL/PLATELET - Abnormal; Notable for the following components:      Result Value   WBC 11.9 (*)    RBC 3.83 (*)    Hemoglobin 11.8 (*)    HCT 35.9 (*)    Neutro Abs 9.1 (*)    Monocytes Absolute 1.2 (*)    All other components within normal limits  BASIC METABOLIC PANEL - Abnormal; Notable for the following components:   Sodium 133 (*)    Glucose, Bld 238 (*)    BUN 36 (*)    Creatinine, Ser 1.33 (*)    Calcium 8.6 (*)    All other components within normal limits  CULTURE, BLOOD (ROUTINE X 2)  CULTURE, BLOOD (ROUTINE X 2)  RESP PANEL  BY RT-PCR (FLU A&B, COVID) [id number]  SEDIMENTATION RATE  C-REACTIVE PROTEIN    EKG None  Radiology DG Foot Complete Left  Result Date: 03/14/2021 CLINICAL DATA:  Left foot wound medially with concern for possible osteo myelitis. EXAM: LEFT FOOT - COMPLETE 3+ VIEW COMPARISON:  Ankle film from 03/01/2021 FINDINGS: There are changes consistent with dislocation of the cuneiforms and cuboid with respect to the tarsal navicular bone, talus and calcaneus. The bony prominence medially relates to the navicular bone secondary to the dislocation. These changes were present on the prior exam. Soft tissue wound is noted over the navicular bone consistent with the given clinical history. No discrete bony erosive changes are identified to suggest osteomyelitis. No acute fracture is noted. IMPRESSION: Changes consistent with dislocation between the cuneiforms and tarsal navicular bone. This creates a bony prominence medially which corresponds to the soft tissue wound. No discrete bony erosive changes are noted to suggest osteomyelitis. Electronically Signed   By: Alcide Clever M.D.   On: 03/14/2021 01:15    Procedures Procedures   Medications Ordered in ED Medications  vancomycin (VANCOCIN) 1,500 mg in sodium chloride 0.9 % 500 mL IVPB (1,500 mg Intravenous New Bag/Given 03/14/21 0118)  vancomycin (VANCOCIN) 500 MG powder (has no administration in time range)  vancomycin (VANCOCIN) 1000 MG powder (has no administration in time range)  sodium chloride 0.9 % bolus 1,000 mL (1,000 mLs Intravenous New Bag/Given 03/14/21 0115)  cefTRIAXone (ROCEPHIN) 2 g in sodium chloride 0.9 % 100 mL IVPB (2 g Intravenous New Bag/Given 03/14/21 0116)    ED Course  I have reviewed the triage vital signs and the nursing notes.  Pertinent labs & imaging results that were available during my care of the patient were reviewed by me and considered in my medical decision making (see chart for details).    MDM Rules/Calculators/A&P  65 yo M with a chief complaints of a wound to the left foot.  The patient has Charcot foot, has developed and ulceration on the medial aspect.  There is no exposed bone and the patient had developed a fever over the past 24 hours.  Called by his podiatrist to come in for IV antibiotics and further work-up for osteomyelitis.   Plain film with chronic findings of Charcot foot.  No obvious signs of osteo-.  Leukocytosis.  No significant electrolyte abnormality.  Will discuss with medicine.  The patients results and plan were reviewed and discussed.   Any x-rays performed were independently reviewed by myself.   Differential diagnosis were considered with the presenting HPI.  Medications  vancomycin (VANCOCIN) 1,500 mg in sodium chloride 0.9 % 500 mL IVPB (1,500 mg Intravenous New Bag/Given 03/14/21 0118)  vancomycin (VANCOCIN) 500 MG powder (has no administration in time range)  vancomycin (VANCOCIN) 1000 MG powder (has no administration in time range)  sodium chloride 0.9 % bolus 1,000 mL (1,000 mLs Intravenous New Bag/Given 03/14/21 0115)  cefTRIAXone (ROCEPHIN) 2 g in sodium chloride 0.9 % 100 mL IVPB (2 g Intravenous New Bag/Given 03/14/21 0116)    Vitals:   03/14/21 0039 03/14/21 0100 03/14/21 0130  BP: 132/73 110/66 127/66  Pulse: 93 89 86  Resp: 20 20 18   Temp: 99 F (37.2 C)    TempSrc: Oral    SpO2: 100% 100% 100%  Weight: 84.7 kg    Height: 6\' 2"  (1.88 m)      Final diagnoses:  Charcot's joint of left foot  Fever in adult  Non-healing wound of lower extremity, left, initial encounter    Admission/ observation were discussed with the admitting physician, patient and/or family and they are comfortable with the plan.     Final Clinical Impression(s) / ED Diagnoses Final diagnoses:  Charcot's joint of left foot  Fever in adult  Non-healing wound of lower extremity, left, initial encounter    Rx / DC Orders ED Discharge Orders     None         , DO 03/14/21 0150

## 2021-03-14 NOTE — H&P (Signed)
History and Physical    [name] VQM:[id number] DOB: Sep 21, 1955 DOA: 03/14/2021  PCP: [name], MD  Patient coming from: Home  Chief Complaint: foot ulcer  HPI: [name] is a 65 y.o. male with medical history significant of DM2, HLD, HTN. Presenting with left foot ulcer. This ulcer has shown up in the last week or two. It's in the medial side of the left foot. He was referred by his PCP to wound care. He was started on santyl dressings and doxycycline. The wound did not seem to improve. So he was seen by podiatry. They recommended getting an MRI and vascular studies. That was scheduled to happen this week. Last night, the patient noticed he was feverish. When he took his temp, he was 101.2 axillary. He became concerned and came to the ED. He denies any other aggravating or alleviating factors.    ED Course: He was started on rocephin and vanc. XR foot did no suggest osteo. Podiatry was consulted. Recommended MRI. TRH was called for admission.   Review of Systems:  Denies CP, dyspnea, palpitations, lightheadedness, dizziness, N/v/D. Review of systems is otherwise negative for all not mentioned in HPI.   PMHx Past Medical History:  Diagnosis Date   Arthritis    Coronary artery disease    Diabetes mellitus without complication (HCC)    Type II   GERD (gastroesophageal reflux disease)    Myocardial infarction [facility])    "Silent"   Neuropathy     PSHx Past Surgical History:  Procedure Laterality Date   CATARACT EXTRACTION Bilateral    COLONOSCOPY W/ POLYPECTOMY     CORONARY ARTERY BYPASS GRAFT N/A 12/26/2017   Procedure: CORONARY ARTERY BYPASS GRAFTING (CABG) x 3 Using Left Internal Mammary Artery (LIMA) and Edoscopically Harvested Left Leg Greater Saphenous Vein Graft (SVG); -LIMA to LAD, -SVG to LEFT CIRCUMFLEX, -SVG to PDA,;  Surgeon: Kerin Perna, MD;  Location: [facility] OR;  Service: Open Heart Surgery;  Laterality: N/A;   ENDOVEIN HARVEST OF GREATER SAPHENOUS VEIN Left  12/26/2017   Procedure: ENDOVEIN HARVEST OF GREATER SAPHENOUS VEIN: Left Thigh to below the knee;  Surgeon: Kerin Perna, MD;  Location: [facility] OR;  Service: Open Heart Surgery;  Laterality: Left;   INTRAVASCULAR PRESSURE WIRE/FFR STUDY N/A 12/19/2017   Procedure: INTRAVASCULAR PRESSURE WIRE/FFR STUDY;  Surgeon: Lyn Records, MD;  Location: [facility] INVASIVE CV LAB;  Service: Cardiovascular;  Laterality: N/A;   LEFT HEART CATH AND CORONARY ANGIOGRAPHY N/A 12/19/2017   Procedure: LEFT HEART CATH AND CORONARY ANGIOGRAPHY;  Surgeon: Lyn Records, MD;  Location: [facility] INVASIVE CV LAB;  Service: Cardiovascular;  Laterality: N/A;   TEE WITHOUT CARDIOVERSION N/A 12/26/2017   Procedure: TRANSESOPHAGEAL ECHOCARDIOGRAM (TEE);  Surgeon: Donata Clay, Theron Arista, MD;  Location: [facility] OR;  Service: Open Heart Surgery;  Laterality: N/A;   VASECTOMY      SocHx  reports that he has never smoked. He has never used smokeless tobacco. He reports that he does not currently use alcohol. No history on file for drug use.  Allergies  Allergen Reactions   Codeine Sulfate [Codeine]     UNSPECIFIED REACTION     FamHx Family History  Problem Relation Age of Onset   Diabetes Mother    Diabetes Father    Heart disease Father    Diabetes Daughter     Prior to Admission medications   Medication Sig Start Date End Date Taking? Authorizing Provider  acetaminophen (TYLENOL) 500 MG tablet Take 2 tablets (  1,000 mg total) by mouth every 6 (six) hours as needed for mild pain or fever. 12/30/17   Barrett, Rae Roam, PA-C  aspirin EC 81 MG tablet Take 81 mg by mouth daily.    [provider]  atorvastatin (LIPITOR) 40 MG tablet Take 1 tablet (40 mg total) by mouth daily at 6 PM. 12/11/17 06/20/19  Georgeanna Lea, MD  calcium carbonate (TUMS - DOSED IN MG ELEMENTAL CALCIUM) 500 MG chewable tablet Chew 1-2 tablets by mouth daily as needed for indigestion or heartburn.    [provider]  LANTUS 100 UNIT/ML injection Inject 25  Units into the skin 2 (two) times daily. 10/12/18   [provider]  lisinopril (ZESTRIL) 5 MG tablet Take 1 tablet (5 mg total) by mouth daily. Patient needs an appointment for further refills. 3 rd/final attempt 01/21/21   Georgeanna Lea, MD  metFORMIN (GLUCOPHAGE) 500 MG tablet Take 500 mg by mouth 2 (two) times daily with a meal.    [provider]  metoprolol succinate (TOPROL-XL) 50 MG 24 hr tablet Take 1 tablet (50 mg total) by mouth daily. Take with or immediately following a meal. 01/30/18 06/20/19  Georgeanna Lea, MD  sitaGLIPtin (JANUVIA) 100 MG tablet Take 100 mg by mouth every morning.     [provider]    Physical Exam: Vitals:   03/14/21 0200 03/14/21 0230 03/14/21 0330 03/14/21 0500  BP: 132/67 128/67 128/75 130/71  Pulse: 83 81 87 83  Resp: 18 18 16 16   Temp:   98.2 F (36.8 C) 98.8 F (37.1 C)  TempSrc:    Oral  SpO2: 100% 100% 100% 100%  Weight:      Height:        General: 65 y.o. male resting in bed in NAD Eyes: PERRL, normal sclera ENMT: Nares patent w/o discharge, orophaynx clear, dentition normal, ears w/o discharge/lesions/ulcers Neck: Supple, trachea midline Cardiovascular: RRR, +S1, S2, no m/g/r, equal pulses throughout Respiratory: CTABL, no w/r/r, normal WOB GI: BS+, NDNT, no masses noted, no organomegaly noted MSK: No c/c; left medial foot ulcer distal to ankle Skin: No rashes, bruises, ulcerations noted Neuro: A&O x 3, no focal deficits Psyc: Appropriate interaction and affect, calm/cooperative  Labs on Admission: I have personally reviewed following labs and imaging studies  CBC: Recent Labs  Lab 03/14/21 0040  WBC 11.9*  NEUTROABS 9.1*  HGB 11.8*  HCT 35.9*  MCV 93.7  PLT 263   Basic Metabolic Panel: Recent Labs  Lab 03/14/21 0040  NA 133*  K 4.3  CL 101  CO2 24  GLUCOSE 238*  BUN 36*  CREATININE 1.33*  CALCIUM 8.6*   GFR: Estimated Creatinine Clearance: 66.1 mL/min (A) (by C-G formula based  on SCr of 1.33 mg/dL (H)). Liver Function Tests: No results for input(s): AST, ALT, ALKPHOS, BILITOT, PROT, ALBUMIN in the last 168 hours. No results for input(s): LIPASE, AMYLASE in the last 168 hours. No results for input(s): AMMONIA in the last 168 hours. Coagulation Profile: No results for input(s): INR, PROTIME in the last 168 hours. Cardiac Enzymes: No results for input(s): CKTOTAL, CKMB, CKMBINDEX, TROPONINI in the last 168 hours. BNP (last 3 results) No results for input(s): PROBNP in the last 8760 hours. HbA1C: No results for input(s): HGBA1C in the last 72 hours. CBG: No results for input(s): GLUCAP in the last 168 hours. Lipid Profile: No results for input(s): CHOL, HDL, LDLCALC, TRIG, CHOLHDL, LDLDIRECT in the last 72 hours. Thyroid Function Tests:  No results for input(s): TSH, T4TOTAL, FREET4, T3FREE, THYROIDAB in the last 72 hours. Anemia Panel: No results for input(s): VITAMINB12, FOLATE, FERRITIN, TIBC, IRON, RETICCTPCT in the last 72 hours. Urine analysis:    Component Value Date/Time   COLORURINE YELLOW 12/25/2017 1006   APPEARANCEUR CLEAR 12/25/2017 1006   LABSPEC 1.018 12/25/2017 1006   PHURINE 6.0 12/25/2017 1006   GLUCOSEU NEGATIVE 12/25/2017 1006   HGBUR SMALL (A) 12/25/2017 1006   BILIRUBINUR NEGATIVE 12/25/2017 1006   KETONESUR NEGATIVE 12/25/2017 1006   PROTEINUR 30 (A) 12/25/2017 1006   NITRITE NEGATIVE 12/25/2017 1006   LEUKOCYTESUR NEGATIVE 12/25/2017 1006    Radiological Exams on Admission: DG Foot Complete Left  Result Date: 03/14/2021 CLINICAL DATA:  Left foot wound medially with concern for possible osteo myelitis. EXAM: LEFT FOOT - COMPLETE 3+ VIEW COMPARISON:  Ankle film from 03/01/2021 FINDINGS: There are changes consistent with dislocation of the cuneiforms and cuboid with respect to the tarsal navicular bone, talus and calcaneus. The bony prominence medially relates to the navicular bone secondary to the dislocation. These changes were  present on the prior exam. Soft tissue wound is noted over the navicular bone consistent with the given clinical history. No discrete bony erosive changes are identified to suggest osteomyelitis. No acute fracture is noted. IMPRESSION: Changes consistent with dislocation between the cuneiforms and tarsal navicular bone. This creates a bony prominence medially which corresponds to the soft tissue wound. No discrete bony erosive changes are noted to suggest osteomyelitis. Electronically Signed   By: Alcide Clever M.D.   On: 03/14/2021 01:15    EKG: None obtained in ED  Assessment/Plan DM foot ulcer Febrile illness     - admit to obs, med-surg     - started on rocephin, vanc; change rocephin to zosyn to cover for PSA     - MRI ordered, ABI ordered     - spoke with podiatry, they will see him today     - pain control  AKI     - fluids; watch nephrotoxins  DM2     - SSI, DM diet, A1c, glucose checks  HTN     - hold home ACEi d/t AKI; resume home regimen otherwise  HLD     - resume home statin  DVT prophylaxis: lovenox  Code Status: FULL  Family Communication: None at bedside  Consults called: Podiatry   Status is: Observation  The patient remains OBS appropriate and will d/c before 2 midnights.  Dispo: The patient is from: Home              Anticipated d/c is to: Home              Patient currently is not medically stable to d/c.   Difficult to place patient No  Time spent coordinating admission: 45 minutes  Dasani Thurlow A Markayla Reichart DO [facility]  If 7PM-7AM, please contact night-coverage [email or web address]  03/14/2021, 7:43 AM

## 2021-03-14 NOTE — ED Triage Notes (Signed)
  Patient comes in with infection to L medial foot.  Patient states he had some dead skin cut off on Mar 13, 2023 and followed up with his podiatrist on 9/28.  Patient states he is in the process of scheduling an MRI to check for osteomyelitis.  Patient states he started running a fever of 101 at home and called PCP that stated he should come in for IV antibiotics.  Patient took 1000 mg of tylenol at 2130 last night.  Pain 2/10, dull pain in L foot.

## 2021-03-14 NOTE — Progress Notes (Signed)
Pharmacy Antibiotic Note  [name] is a 65 y.o. male admitted on 03/14/2021 with left foot ulcer not improving on doxycycline. XR w/o osteo.  Pharmacy has been consulted for vancomycin and Zosyn dosing.  Plan: Vancomycin 1500 mg given once in the ED followed by vancomycin IV 1750 mg Q 24 hrs. Goal AUC 400-550. Expected AUC: 484 SCr used: 1.33  Cefepime 2 g iv q 8 hours per MD  Flagyl 500 mg iv q 12 hours per MD  Will f/u renal function, culture results, and clinical course Levels if/when indicated  Height: 6\' 2"  (188 cm) Weight: 84.7 kg (186 lb 11.2 oz) IBW/kg (Calculated) : 82.2  Temp (24hrs), Avg:98.7 F (37.1 C), Min:98.2 F (36.8 C), Max:99 F (37.2 C)  Recent Labs  Lab 03/14/21 0040  WBC 11.9*  CREATININE 1.33*    Estimated Creatinine Clearance: 66.1 mL/min (A) (by C-G formula based on SCr of 1.33 mg/dL (H)).    Allergies  Allergen Reactions   Codeine Sulfate [Codeine]     UNSPECIFIED REACTION     Antimicrobials this admission: 10/2 CTX x 1 10/2 vanc >>  10/2 Flagyl >> 10/2 cefepime >>   Dose adjustments this admission:  Microbiology results: 10/2 BCx:   Thank you for allowing pharmacy to be a part of this patient's care.  12/2 D 03/14/2021 10:14 AM

## 2021-03-14 NOTE — Plan of Care (Signed)

## 2021-03-15 ENCOUNTER — Inpatient Hospital Stay (HOSPITAL_COMMUNITY): Payer: BC Managed Care – PPO

## 2021-03-15 ENCOUNTER — Ambulatory Visit: Payer: Self-pay | Admitting: Podiatry

## 2021-03-15 ENCOUNTER — Encounter (HOSPITAL_COMMUNITY)
Admission: EM | Disposition: A | Discharge status: Home Health | Payer: Self-pay | Source: Home / Self Care | Attending: Internal Medicine

## 2021-03-15 DIAGNOSIS — I10 Essential (primary) hypertension: Secondary | ICD-10-CM

## 2021-03-15 DIAGNOSIS — E1165 Type 2 diabetes mellitus with hyperglycemia: Secondary | ICD-10-CM

## 2021-03-15 DIAGNOSIS — M86172 Other acute osteomyelitis, left ankle and foot: Secondary | ICD-10-CM

## 2021-03-15 DIAGNOSIS — L0390 Cellulitis, unspecified: Secondary | ICD-10-CM

## 2021-03-15 DIAGNOSIS — M869 Osteomyelitis, unspecified: Secondary | ICD-10-CM

## 2021-03-15 DIAGNOSIS — E11621 Type 2 diabetes mellitus with foot ulcer: Secondary | ICD-10-CM | POA: Diagnosis not present

## 2021-03-15 LAB — COMPREHENSIVE METABOLIC PANEL
ALT: 15 U/L (ref 0–44)
AST: 12 U/L — ABNORMAL LOW (ref 15–41)
Albumin: 2.7 g/dL — ABNORMAL LOW (ref 3.5–5.0)
Alkaline Phosphatase: 60 U/L (ref 38–126)
Anion gap: 5 (ref 5–15)
BUN: 22 mg/dL (ref 8–23)
CO2: 23 mmol/L (ref 22–32)
Calcium: 8.1 mg/dL — ABNORMAL LOW (ref 8.9–10.3)
Chloride: 107 mmol/L (ref 98–111)
Creatinine, Ser: 0.9 mg/dL (ref 0.61–1.24)
GFR, Estimated: >60 mL/min (ref >60)
Glucose, Bld: 177 mg/dL — ABNORMAL HIGH (ref 70–99)
Potassium: 4.1 mmol/L (ref 3.5–5.1)
Sodium: 135 mmol/L (ref 135–145)
Total Bilirubin: 0.7 mg/dL (ref 0.3–1.2)
Total Protein: 5.7 g/dL — ABNORMAL LOW (ref 6.5–8.1)

## 2021-03-15 LAB — MRSA NEXT GEN BY PCR, NASAL: MRSA by PCR Next Gen: NOT DETECTED

## 2021-03-15 LAB — CBC
HCT: 32.7 % — ABNORMAL LOW (ref 39.0–52.0)
Hemoglobin: 10.6 g/dL — ABNORMAL LOW (ref 13.0–17.0)
MCH: 30.9 pg (ref 26.0–34.0)
MCHC: 32.4 g/dL (ref 30.0–36.0)
MCV: 95.3 fL (ref 80.0–100.0)
Platelets: 209 10*3/uL (ref 150–400)
RBC: 3.43 MIL/uL — ABNORMAL LOW (ref 4.22–5.81)
RDW: 12.9 % (ref 11.5–15.5)
WBC: 10.1 10*3/uL (ref 4.0–10.5)
nRBC: 0 % (ref 0.0–0.2)

## 2021-03-15 LAB — GLUCOSE, CAPILLARY
Glucose-Capillary: 157 mg/dL — ABNORMAL HIGH (ref 70–99)
Glucose-Capillary: 274 mg/dL — ABNORMAL HIGH (ref 70–99)
Glucose-Capillary: 171 mg/dL — ABNORMAL HIGH (ref 70–99)
Glucose-Capillary: 374 mg/dL — ABNORMAL HIGH (ref 70–99)

## 2021-03-15 LAB — HIV ANTIBODY (ROUTINE TESTING W REFLEX): HIV Screen 4th Generation wRfx: NONREACTIVE

## 2021-03-15 LAB — HEMOGLOBIN A1C
Hgb A1c MFr Bld: 8.2 % — ABNORMAL HIGH (ref 4.8–5.6)
Mean Plasma Glucose: 188.64 mg/dL

## 2021-03-15 SURGERY — DEBRIDEMENT, WOUND
Anesthesia: Monitor Anesthesia Care | Laterality: Left

## 2021-03-15 MED ORDER — METOPROLOL SUCCINATE ER 50 MG PO TB24
50.0000 mg | ORAL_TABLET | Freq: Every day | ORAL | Status: DC
  Administered 2021-03-15 – 2021-03-22 (×8): 50 mg via ORAL
  Filled 2021-03-15 (×8): qty 1

## 2021-03-15 MED ORDER — ATORVASTATIN CALCIUM 40 MG PO TABS
80.0000 mg | ORAL_TABLET | Freq: Every day | ORAL | Status: DC
  Administered 2021-03-15 – 2021-03-22 (×8): 80 mg via ORAL
  Filled 2021-03-15 (×8): qty 2

## 2021-03-15 MED ORDER — ENSURE PRE-SURGERY PO LIQD
296.0000 mL | Freq: Once | ORAL | Status: AC
  Administered 2021-03-15: 296 mL via ORAL
  Filled 2021-03-15: qty 296

## 2021-03-15 MED ORDER — INSULIN GLARGINE-YFGN 100 UNIT/ML ~~LOC~~ SOLN
8.0000 [IU] | Freq: Every day | SUBCUTANEOUS | Status: DC
  Administered 2021-03-15 – 2021-03-22 (×7): 8 [IU] via SUBCUTANEOUS
  Filled 2021-03-15 (×8): qty 0.08

## 2021-03-15 NOTE — Plan of Care (Signed)
°  Problem: Education: °Goal: Knowledge of General Education information will improve °Description: Including pain rating scale, medication(s)/side effects and non-pharmacologic comfort measures °Outcome: Progressing °  °Problem: Clinical Measurements: °Goal: Diagnostic test results will improve °Outcome: Progressing °  °Problem: Pain Managment: °Goal: General experience of comfort will improve °Outcome: Progressing °  °Problem: Safety: °Goal: Ability to remain free from injury will improve °Outcome: Progressing °  °

## 2021-03-15 NOTE — Progress Notes (Signed)
ABI's have been completed. Preliminary results can be found in CV Proc through chart review.   03/15/21 9:36 AM Olen Cordial RVT

## 2021-03-15 NOTE — H&P (View-Only) (Signed)
  Subjective:  Patient ID: [name], male    DOB: June 19, 1955,  MRN: [id number]  Patient seen bedside. No overnight events. Understands need for surgery today. Al questions answered.  Objective:   Vitals:   03/14/21 2146 03/15/21 0527  BP: 123/66 137/70  Pulse: 88 90  Resp: 18 16  Temp: 98.9 F (37.2 C) 98.2 F (36.8 C)  SpO2: 96% 96%    General AA&O x3. Normal mood and affect.  Vascular Dorsalis pedis and posterior tibial pulses  present 1+ bilaterally  Capillary refill normal to all digits. Pedal hair growth normal.  Neurologic Epicritic sensation grossly absent.  Dermatologic (Wound) Full-thickness ulcer on medial midfoot with fibronecrotic wound base, malodor and drainage with periwound erythema and cellulitis. No ascending cellulitis, lymphangitis  Orthopedic: Motor intact BLE.  Gross abduction deformity of the midfoot with prominent bone medially    Assessment & Plan:  Patient was evaluated and treated and all questions answered.  Left foot wound with osteomyelitis; setting of acute Charcot with dislocation -Imaging studies personally reviewed -Continue empiric abx. Will obtain bone biopsy today. -Strict NWB with knee scooter / crutch / walker assist. -Discussed operative plan with patient. -Will allow patient to eat breakfast as surgery not until early evening. NPO as of 10am. -Plan for OR this evening for debridement of the left foot wound, partial navicular resection. Case requested, will add on with OR. -Will likely need RTOR later in the week. Patient aware and understands. May need wound VAC depending upon extent of wound necrosis. -Will continue to follow.  Park Liter, DPM  Accessible via secure chat for questions or concerns.

## 2021-03-15 NOTE — Progress Notes (Signed)
Inpatient Diabetes Program Recommendations  AACE/ADA: New Consensus Statement on Inpatient Glycemic Control (2015)  Target Ranges:  Prepandial:   less than 140 mg/dL      Peak postprandial:   less than 180 mg/dL (1-2 hours)      Critically ill patients:  140 - 180 mg/dL   Lab Results  Component Value Date   GLUCAP 274 (H) 03/15/2021   HGBA1C 8.2 (H) 03/15/2021   Results for [name], [name] "MILT" (MRN [id number]) as of 03/15/2021 11:40  Ref. Range 03/14/2021 11:17 03/14/2021 16:58 03/14/2021 21:48 03/15/2021 07:22 03/15/2021 11:32  Glucose-Capillary Latest Ref Range: 70 - 99 mg/dL 037 (H) 096 (H) 438 (H) 157 (H) 274 (H)   Review of Glycemic Control  Diabetes history: DM 2 Outpatient Diabetes medications: Lantus 25 units bid, Metformin 850 mg bid, Januvia 100 mg Daily Current orders for Inpatient glycemic control:  Novolog 0-15 units tid + hs  A1c 8.2% on 10/3  Inpatient Diabetes Program Recommendations:   Glucose trends increase after PO intake -  Add Novolog 3 units tid meal coverage if eating >50% of meals  Thanks,  Christena Deem RN, MSN, BC-ADM Inpatient Diabetes Coordinator Team Pager [phone] (8a-5p)

## 2021-03-15 NOTE — Plan of Care (Signed)
Received call from OR scheduling about re-scheduling case given power issues. Case to be rescheduled for tomorrow AM. Will lift NPO, then resume at midnight tonight.  Park Liter, DPM 03/15/21  1:44 PM

## 2021-03-15 NOTE — Progress Notes (Signed)
  Subjective:  Patient ID: [name], male    DOB: 04/17/1956,  MRN: [id number]  Patient seen bedside. No overnight events. Understands need for surgery today. Al questions answered.  Objective:   Vitals:   03/14/21 2146 03/15/21 0527  BP: 123/66 137/70  Pulse: 88 90  Resp: 18 16  Temp: 98.9 F (37.2 C) 98.2 F (36.8 C)  SpO2: 96% 96%    General AA&O x3. Normal mood and affect.  Vascular Dorsalis pedis and posterior tibial pulses  present 1+ bilaterally  Capillary refill normal to all digits. Pedal hair growth normal.  Neurologic Epicritic sensation grossly absent.  Dermatologic (Wound) Full-thickness ulcer on medial midfoot with fibronecrotic wound base, malodor and drainage with periwound erythema and cellulitis. No ascending cellulitis, lymphangitis  Orthopedic: Motor intact BLE.  Gross abduction deformity of the midfoot with prominent bone medially    Assessment & Plan:  Patient was evaluated and treated and all questions answered.  Left foot wound with osteomyelitis; setting of acute Charcot with dislocation -Imaging studies personally reviewed -Continue empiric abx. Will obtain bone biopsy today. -Strict NWB with knee scooter / crutch / walker assist. -Discussed operative plan with patient. -Will allow patient to eat breakfast as surgery not until early evening. NPO as of 10am. -Plan for OR this evening for debridement of the left foot wound, partial navicular resection. Case requested, will add on with OR. -Will likely need RTOR later in the week. Patient aware and understands. May need wound VAC depending upon extent of wound necrosis. -Will continue to follow.  Kessler Solly J Severo Beber, DPM  Accessible via secure chat for questions or concerns.  

## 2021-03-15 NOTE — Plan of Care (Signed)
  Problem: Activity: Goal: Risk for activity intolerance will decrease Outcome: Progressing   Problem: Pain Managment: Goal: General experience of comfort will improve Outcome: Progressing   Problem: Safety: Goal: Ability to remain free from injury will improve Outcome: Progressing   

## 2021-03-15 NOTE — Progress Notes (Signed)
[facility] PROGRESS NOTE   ROGERIO [name] KCM:[id number] DOB: 03/27/1956 DOA: 03/14/2021  PCP: [name], MD  Brief History/Interval Summary: 65 y.o. male with medical history significant of DM2, HLD, HTN. Presenting with left foot ulcer. This ulcer has shown up in the last week or two. It's in the medial side of the left foot. He was referred by his PCP to wound care. He was started on santyl dressings and doxycycline. The wound did not seem to improve. So he was seen by podiatry. They recommended getting an MRI and vascular studies. That was scheduled to happen this week.  Patient presented to the emergency department due to onset of fever.  He was hospitalized for further management.    Consultants: Podiatry  Procedures: Plan is for debridement later today in the OR    Subjective/Interval History: Patient denies any pain in the left foot.  No shortness of breath or chest pain.  His wife is at the bedside.  ROS: Denies any nausea or vomiting    Assessment/Plan:  Diabetic foot ulcer involving left foot with concern for osteomyelitis WBC was noted to be elevated at 11.9.  CRP 6.0.  ESR 50. Plain x-rays were done which did not show any discrete bony erosive changes suggesting osteomyelitis. 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  Possible early osteomyelitis of the medial navicular bone was noted. Patient currently on vancomycin cefepime and metronidazole.  Follow-up on cultures.  Podiatry is planning bone cultures. Patient seen by podiatry and plan is for debridement later today. ABI shows mild left lower extremity arterial disease.  Will defer this to podiatry to determine if vascular surgery needs to be involved. Patient does not have any significant pain in the lower extremities.  Normocytic anemia Mild drop in hemoglobin noted.  Possibly dilutional.  No overt bleeding is  present.  Continue to monitor.  Acute kidney injury Creatinine was 1.33 on presentation with a BUN of 36.  Improved with IV hydration.  Diabetes mellitus type 2, uncontrolled with hyperglycemia Patient tells me his recent HbA1c was greater than 8.  He uses Lantus insulin at home along with Jardiance and metformin.  Currently noted to be on just SSI.  Will initiate long-acting insulin.  Essential hypertension Holding ACE inhibitor.  Monitor blood pressures closely.  Hyperlipidemia Was on statin at home.   DVT Prophylaxis: Lovenox Code Status: Full code Family Communication: Discussed with the patient and his wife Disposition Plan: Hopefully return home in a few days  Status is: Inpatient  Remains inpatient appropriate because:IV treatments appropriate due to intensity of illness or inability to take PO and Inpatient level of care appropriate due to severity of illness  Dispo: The patient is from: Home              Anticipated d/c is to: Home              Patient currently is not medically stable to d/c.   Difficult to place patient No       Medications: Scheduled:  enoxaparin (LOVENOX) injection  40 mg Subcutaneous Q24H   insulin aspart  0-15 Units Subcutaneous TID WC   insulin aspart  0-5 Units Subcutaneous QHS   Continuous:  sodium chloride 1,000 mL (03/15/21 0433)   ceFEPime (MAXIPIME) IV 2 g (03/15/21 1032)   metronidazole 500 mg (03/15/21 1115)   vancomycin 1,750 mg (03/15/21 0005)   AVW:PVXYIA chloride, acetaminophen **OR** acetaminophen,  ondansetron **OR** ondansetron (ZOFRAN) IV  Antibiotics: Anti-infectives (From admission, onward)    Start     Dose/Rate Route Frequency Ordered Stop   03/15/21 0000  vancomycin (VANCOREADY) IVPB 1750 mg/350 mL        1,750 mg 175 mL/hr over 120 Minutes Intravenous Every 24 hours 03/14/21 1014     03/14/21 1130  ceFEPIme (MAXIPIME) 2 g in sodium chloride 0.9 % 100 mL IVPB        2 g 200 mL/hr over 30 Minutes Intravenous  Every 8 hours 03/14/21 1034     03/14/21 1130  metroNIDAZOLE (FLAGYL) IVPB 500 mg        500 mg 100 mL/hr over 60 Minutes Intravenous Every 12 hours 03/14/21 1034     03/14/21 1100  piperacillin-tazobactam (ZOSYN) IVPB 3.375 g  Status:  Discontinued        3.375 g 12.5 mL/hr over 240 Minutes Intravenous Every 8 hours 03/14/21 1014 03/14/21 1034   03/14/21 0101  vancomycin (VANCOCIN) 1000 MG powder       Note to Pharmacy: [name]   : cabinet override      03/14/21 0101 03/14/21 0341   03/14/21 0100  vancomycin (VANCOREADY) IVPB 1500 mg/300 mL  Status:  Discontinued        1,500 mg 150 mL/hr over 120 Minutes Intravenous  Once 03/14/21 0053 03/14/21 0055   03/14/21 0100  cefTRIAXone (ROCEPHIN) 2 g in sodium chloride 0.9 % 100 mL IVPB        2 g 200 mL/hr over 30 Minutes Intravenous  Once 03/14/21 0053 03/14/21 0244   03/14/21 0100  vancomycin (VANCOCIN) 1,500 mg in sodium chloride 0.9 % 500 mL IVPB        1,500 mg 250 mL/hr over 120 Minutes Intravenous  Once 03/14/21 0055 03/14/21 0341   03/14/21 0100  vancomycin (VANCOCIN) 500 MG powder       Note to Pharmacy: [name]   : cabinet override      03/14/21 0100 03/14/21 0341       Objective:  Vital Signs  Vitals:   03/14/21 1457 03/14/21 1709 03/14/21 2146 03/15/21 0527  BP: 128/71  123/66 137/70  Pulse: 88  88 90  Resp: '18  18 16  ' Temp: 99.9 F (37.7 C) 100 F (37.8 C) 98.9 F (37.2 C) 98.2 F (36.8 C)  TempSrc: Oral Oral Oral Oral  SpO2: 98%  96% 96%  Weight:      Height:        Intake/Output Summary (Last 24 hours) at 03/15/2021 1141 Last data filed at 03/15/2021 0600 Gross per 24 hour  Intake 950 ml  Output --  Net 950 ml   Filed Weights   03/14/21 0039  Weight: 84.7 kg    General appearance: Awake alert.  In no distress Resp: Clear to auscultation bilaterally.  Normal effort Cardio: S1-S2 is normal regular.  No S3-S4.  No rubs murmurs or bruit GI: Abdomen is soft.  Nontender nondistended.  Bowel  sounds are present normal.  No masses organomegaly Extremities: Left foot covered in dressing which was not removed. Neurologic: Alert and oriented x3.  No focal neurological deficits.    Lab Results:  Data Reviewed: I have personally reviewed following labs and imaging studies  CBC: Recent Labs  Lab 03/14/21 0040 03/15/21 0332  WBC 11.9* 10.1  NEUTROABS 9.1*  --   HGB 11.8* 10.6*  HCT 35.9* 32.7*  MCV 93.7 95.3  PLT 263 209  Basic Metabolic Panel: Recent Labs  Lab 03/14/21 0040 03/15/21 0332  NA 133* 135  K 4.3 4.1  CL 101 107  CO2 24 23  GLUCOSE 238* 177*  BUN 36* 22  CREATININE 1.33* 0.90  CALCIUM 8.6* 8.1*    GFR: Estimated Creatinine Clearance: 97.7 mL/min (by C-G formula based on SCr of 0.9 mg/dL).  Liver Function Tests: Recent Labs  Lab 03/15/21 0332  AST 12*  ALT 15  ALKPHOS 60  BILITOT 0.7  PROT 5.7*  ALBUMIN 2.7*     HbA1C: Recent Labs    03/15/21 0332  HGBA1C 8.2*    CBG: Recent Labs  Lab 03/14/21 1117 03/14/21 1658 03/14/21 2148 03/15/21 0722 03/15/21 1132  GLUCAP 145* 214* 176* 157* 274*     Recent Results (from the past 240 hour(s))  Blood culture (routine x 2)     Status: None (Preliminary result)   Collection Time: 03/14/21 12:40 AM   Specimen: BLOOD  Result Value Ref Range Status   Specimen Description   Final    BLOOD RIGHT ANTECUBITAL Performed at [facility], [address], [place], [place] [address]    Special Requests   Final    BOTTLES DRAWN AEROBIC AND ANAEROBIC Blood Culture adequate volume Performed at [facility], [address], [place], [place] [address]    Culture   Final    NO GROWTH < 12 HOURS Performed at [facility], [place] [address] [place], [place] [address]    Report Status PENDING  Incomplete  Blood culture (routine x 2)     Status: None (Preliminary result)   Collection Time: 03/14/21  1:10 AM   Specimen: BLOOD LEFT FOREARM  Result  Value Ref Range Status   Specimen Description   Final    BLOOD LEFT FOREARM Performed at [facility], [place] [address] [place], [place] [address]    Special Requests   Final    BOTTLES DRAWN AEROBIC AND ANAEROBIC Blood Culture adequate volume Performed at [facility], [address] [place], [place] [address]    Culture   Final    NO GROWTH < 12 HOURS Performed at [facility], [place] [address] [place], [place] [address]    Report Status PENDING  Incomplete  Resp Panel by RT-PCR (Flu A&B, Covid) Nasopharyngeal Swab     Status: None   Collection Time: 03/14/21  1:10 AM   Specimen: Nasopharyngeal Swab; Nasopharyngeal(NP) swabs in vial transport medium  Result Value Ref Range Status   SARS Coronavirus 2 by RT PCR NEGATIVE NEGATIVE Final    Comment: (NOTE) SARS-CoV-2 target nucleic acids are NOT DETECTED.  The SARS-CoV-2 RNA is generally detectable in upper respiratory specimens during the acute phase of infection. The lowest concentration of SARS-CoV-2 viral copies this assay can detect is 138 copies/mL. A negative result does not preclude SARS-Cov-2 infection and should not be used as the sole basis for treatment or other patient management decisions. A negative result may occur with  improper specimen collection/handling, submission of specimen other than nasopharyngeal swab, presence of viral mutation(s) within the areas targeted by this assay, and inadequate number of viral copies(<138 copies/mL). A negative result must be combined with clinical observations, patient history, and epidemiological information. The expected result is Negative.  Fact Sheet for Patients:  EntrepreneurPulse.com.au  Fact Sheet for Healthcare Providers:  IncredibleEmployment.be  This test is no t yet approved or cleared by the [place] FDA and  has been  authorized for detection and/or diagnosis of SARS-CoV-2 by FDA under an  Emergency Use Authorization (EUA). This EUA will remain  in effect (meaning this test can be used) for the duration of the COVID-19 declaration under Section 564(b)(1) of the Act, 21 U.S.C.section 360bbb-3(b)(1), unless the authorization is terminated  or revoked sooner.       Influenza A by PCR NEGATIVE NEGATIVE Final   Influenza B by PCR NEGATIVE NEGATIVE Final    Comment: (NOTE) The Xpert Xpress SARS-CoV-2/FLU/RSV plus assay is intended as an aid in the diagnosis of influenza from Nasopharyngeal swab specimens and should not be used as a sole basis for treatment. Nasal washings and aspirates are unacceptable for Xpert Xpress SARS-CoV-2/FLU/RSV testing.  Fact Sheet for Patients: EntrepreneurPulse.com.au  Fact Sheet for Healthcare Providers: IncredibleEmployment.be  This test is not yet approved or cleared by the [place] FDA and has been authorized for detection and/or diagnosis of SARS-CoV-2 by FDA under an Emergency Use Authorization (EUA). This EUA will remain in effect (meaning this test can be used) for the duration of the COVID-19 declaration under Section 564(b)(1) of the Act, 21 U.S.C. section 360bbb-3(b)(1), unless the authorization is terminated or revoked.  Performed at [facility], [address] [place], [place] [address]   MRSA Next Gen by PCR, Nasal     Status: None   Collection Time: 03/15/21  4:30 AM   Specimen: Nasal Mucosa; Nasal Swab  Result Value Ref Range Status   MRSA by PCR Next Gen NOT DETECTED NOT DETECTED Final    Comment: (NOTE) The GeneXpert MRSA Assay (FDA approved for NASAL specimens only), is one component of a comprehensive MRSA colonization surveillance program. It is not intended to diagnose MRSA infection nor to guide or monitor treatment for MRSA infections. Test performance is not FDA approved in patients less than 74 years old. Performed at [facility], [place] [address], [place], [place] [address]       Radiology Studies: MR FOOT LEFT WO CONTRAST  Result Date: 03/14/2021 CLINICAL DATA:  Medial foot ulcer. EXAM: MRI OF THE LEFT FOOT WITHOUT CONTRAST TECHNIQUE: Multiplanar, multisequence MR imaging of the left ankle was performed. No intravenous contrast was administered. COMPARISON:  Left foot x-rays from same day. Left ankle x-rays dated March 01, 2021. FINDINGS: Bones/Joint/Cartilage Medial dislocation of the navicular with respect to the kidney a forms, as well as the calcaneus with respect to the cuboid. Mild impaction fracture of the anterolateral calcaneus (series 8, image 18). Patchy marrow edema involving the calcaneus, cuboid, cuneiforms, and navicular, likely related to neuropathic arthropathy. No joint effusion. Ligaments Lisfranc ligament is intact. Muscles and Tendons Flexor and extensor tendons are intact. Split tear of the peroneal brevis tendon (series 5, image 17). Intact Achilles tendon with mild tendinosis. Soft tissue Soft tissue ulceration of the medial midfoot overlying the dislocated navicular. Diffuse soft tissue swelling. No fluid collection or hematoma. No soft tissue mass. IMPRESSION: 1. 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 2. Soft tissue ulceration of the medial midfoot overlying the dislocated navicular, extending to bone. Probable early osteomyelitis of the medial navicular given exposed bone. No abscess. 3. Impaction fracture of the anterolateral calcaneus. 4. Split tear of the peroneal brevis tendon. Electronically Signed   By: Titus Dubin M.D.   On: 03/14/2021 12:50   DG Foot Complete Left  Result Date: 03/14/2021 CLINICAL DATA:  Left foot wound medially with concern for possible osteo myelitis. EXAM: LEFT FOOT - COMPLETE 3+  VIEW COMPARISON:  Ankle film from 03/01/2021 FINDINGS: There are changes consistent with dislocation of the cuneiforms and cuboid with  respect to the tarsal navicular bone, talus and calcaneus. The bony prominence medially relates to the navicular bone secondary to the dislocation. These changes were present on the prior exam. Soft tissue wound is noted over the navicular bone consistent with the given clinical history. No discrete bony erosive changes are identified to suggest osteomyelitis. No acute fracture is noted. IMPRESSION: Changes consistent with dislocation between the cuneiforms and tarsal navicular bone. This creates a bony prominence medially which corresponds to the soft tissue wound. No discrete bony erosive changes are noted to suggest osteomyelitis. Electronically Signed   By: Inez Catalina M.D.   On: 03/14/2021 01:15   VAS [place] ABI WITH/WO TBI  Result Date: 03/15/2021  LOWER EXTREMITY DOPPLER STUDY Patient Name:  [name]  Date of Exam:   03/15/2021 Medical Rec #: [id number]        Accession #:    [id number] Date of Birth: [birth year]        Patient Gender: M Patient Age:   64 years Exam Location:  [facility] Procedure:      VAS [place] ABI WITH/WO TBI Referring Phys: Cherylann Ratel --------------------------------------------------------------------------------  Indications: Ulceration. High Risk Factors: Hypertension, Diabetes.  Comparison Study: No prior studies. Performing Technologist: Carlos Levering RVT  Examination Guidelines: A complete evaluation includes at minimum, Doppler waveform signals and systolic blood pressure reading at the level of bilateral brachial, anterior tibial, and posterior tibial arteries, when vessel segments are accessible. Bilateral testing is considered an integral part of a complete examination. Photoelectric Plethysmograph (PPG) waveforms and toe systolic pressure readings are included as required and additional duplex testing as needed. Limited examinations for reoccurring indications may be performed as noted.  ABI Findings: +---------+------------------+-----+---------+--------+ Right     Rt Pressure (mmHg)IndexWaveform Comment  +---------+------------------+-----+---------+--------+ Brachial 152                    triphasic         +---------+------------------+-----+---------+--------+ PTA      169               1.11 triphasic         +---------+------------------+-----+---------+--------+ DP       148               0.97 triphasic         +---------+------------------+-----+---------+--------+ Great Toe83                0.55                   +---------+------------------+-----+---------+--------+ +---------+------------------+-----+---------+-------+ Left     Lt Pressure (mmHg)IndexWaveform Comment +---------+------------------+-----+---------+-------+ Brachial 139                    triphasic        +---------+------------------+-----+---------+-------+ PTA      117               0.77 triphasic        +---------+------------------+-----+---------+-------+ DP       131               0.86 triphasic        +---------+------------------+-----+---------+-------+ Great Toe55                0.36                  +---------+------------------+-----+---------+-------+ +-------+-----------+-----------+------------+------------+  ABI/TBIToday's ABIToday's TBIPrevious ABIPrevious TBI +-------+-----------+-----------+------------+------------+ Right  1.11       0.55                                +-------+-----------+-----------+------------+------------+ Left   0.86       0.36                                +-------+-----------+-----------+------------+------------+  Summary: Right: Resting right ankle-brachial index is within normal range. No evidence of significant right lower extremity arterial disease. The right toe-brachial index is abnormal. Left: Resting left ankle-brachial index indicates mild left lower extremity arterial disease. The left toe-brachial index is abnormal.  *See table(s) above for measurements and  observations.     Preliminary        LOS: 1 day   Vennela Jutte [employer] on [email or web address]  03/15/2021, 11:41 AM

## 2021-03-15 NOTE — Plan of Care (Signed)
  Problem: Clinical Measurements: Goal: Ability to maintain clinical measurements within normal limits will improve Outcome: Progressing   Problem: Clinical Measurements: Goal: Will remain free from infection Outcome: Progressing   Problem: Pain Managment: Goal: General experience of comfort will improve Outcome: Progressing   

## 2021-03-16 ENCOUNTER — Inpatient Hospital Stay (HOSPITAL_COMMUNITY): Payer: BC Managed Care – PPO

## 2021-03-16 ENCOUNTER — Encounter (HOSPITAL_COMMUNITY)
Admission: EM | Disposition: A | Discharge status: Home Health | Payer: Self-pay | Source: Home / Self Care | Attending: Internal Medicine

## 2021-03-16 ENCOUNTER — Encounter (HOSPITAL_COMMUNITY): Payer: Self-pay | Admitting: Internal Medicine

## 2021-03-16 ENCOUNTER — Inpatient Hospital Stay (HOSPITAL_COMMUNITY): Payer: BC Managed Care – PPO | Admitting: Certified Registered Nurse Anesthetist

## 2021-03-16 ENCOUNTER — Telehealth: Payer: Self-pay | Admitting: Podiatry

## 2021-03-16 DIAGNOSIS — I10 Essential (primary) hypertension: Secondary | ICD-10-CM | POA: Diagnosis not present

## 2021-03-16 DIAGNOSIS — M869 Osteomyelitis, unspecified: Secondary | ICD-10-CM | POA: Diagnosis not present

## 2021-03-16 DIAGNOSIS — E11621 Type 2 diabetes mellitus with foot ulcer: Secondary | ICD-10-CM | POA: Diagnosis not present

## 2021-03-16 DIAGNOSIS — E1165 Type 2 diabetes mellitus with hyperglycemia: Secondary | ICD-10-CM | POA: Diagnosis not present

## 2021-03-16 DIAGNOSIS — M86172 Other acute osteomyelitis, left ankle and foot: Secondary | ICD-10-CM

## 2021-03-16 HISTORY — PX: WOUND DEBRIDEMENT: SHX247

## 2021-03-16 LAB — CBC
HCT: 39.2 % (ref 39.0–52.0)
Hemoglobin: 12.6 g/dL — ABNORMAL LOW (ref 13.0–17.0)
MCH: 31 pg (ref 26.0–34.0)
MCHC: 32.1 g/dL (ref 30.0–36.0)
MCV: 96.3 fL (ref 80.0–100.0)
Platelets: 253 10*3/uL (ref 150–400)
RBC: 4.07 MIL/uL — ABNORMAL LOW (ref 4.22–5.81)
RDW: 12.7 % (ref 11.5–15.5)
WBC: 11.1 10*3/uL — ABNORMAL HIGH (ref 4.0–10.5)
nRBC: 0 % (ref 0.0–0.2)

## 2021-03-16 LAB — BASIC METABOLIC PANEL
Anion gap: 9 (ref 5–15)
BUN: 22 mg/dL (ref 8–23)
CO2: 25 mmol/L (ref 22–32)
Calcium: 9 mg/dL (ref 8.9–10.3)
Chloride: 104 mmol/L (ref 98–111)
Creatinine, Ser: 0.79 mg/dL (ref 0.61–1.24)
GFR, Estimated: >60 mL/min (ref >60)
Glucose, Bld: 237 mg/dL — ABNORMAL HIGH (ref 70–99)
Potassium: 4.8 mmol/L (ref 3.5–5.1)
Sodium: 138 mmol/L (ref 135–145)

## 2021-03-16 LAB — GLUCOSE, CAPILLARY
Glucose-Capillary: 206 mg/dL — ABNORMAL HIGH (ref 70–99)
Glucose-Capillary: 221 mg/dL — ABNORMAL HIGH (ref 70–99)
Glucose-Capillary: 151 mg/dL — ABNORMAL HIGH (ref 70–99)
Glucose-Capillary: 193 mg/dL — ABNORMAL HIGH (ref 70–99)
Glucose-Capillary: 201 mg/dL — ABNORMAL HIGH (ref 70–99)

## 2021-03-16 SURGERY — DEBRIDEMENT, WOUND
Anesthesia: Monitor Anesthesia Care | Site: Foot | Laterality: Left

## 2021-03-16 MED ORDER — ONDANSETRON HCL 4 MG/2ML IJ SOLN
INTRAMUSCULAR | Status: DC | PRN
  Administered 2021-03-16: 4 mg via INTRAVENOUS

## 2021-03-16 MED ORDER — SODIUM CHLORIDE 0.9 % IR SOLN
Status: DC | PRN
  Administered 2021-03-16: 1000 mL
  Administered 2021-03-16: 3000 mL

## 2021-03-16 MED ORDER — BUPIVACAINE HCL (PF) 0.5 % IJ SOLN
INTRAMUSCULAR | Status: DC | PRN
  Administered 2021-03-16: 10 mL

## 2021-03-16 MED ORDER — LACTATED RINGERS IV SOLN: INTRAVENOUS | Status: DC

## 2021-03-16 MED ORDER — VANCOMYCIN HCL 1000 MG IV SOLR
INTRAVENOUS | Status: AC
  Filled 2021-03-16: qty 20

## 2021-03-16 MED ORDER — MIDAZOLAM HCL 2 MG/2ML IJ SOLN
INTRAMUSCULAR | Status: AC
  Filled 2021-03-16: qty 2

## 2021-03-16 MED ORDER — BUPIVACAINE HCL (PF) 0.5 % IJ SOLN
INTRAMUSCULAR | Status: AC
  Filled 2021-03-16: qty 30

## 2021-03-16 MED ORDER — FENTANYL CITRATE PF 50 MCG/ML IJ SOSY: 25.0000 ug | PREFILLED_SYRINGE | INTRAMUSCULAR | Status: DC | PRN

## 2021-03-16 MED ORDER — MIDAZOLAM HCL 2 MG/2ML IJ SOLN
INTRAMUSCULAR | Status: DC | PRN
  Administered 2021-03-16: 2 mg via INTRAVENOUS

## 2021-03-16 MED ORDER — FENTANYL CITRATE (PF) 100 MCG/2ML IJ SOLN
INTRAMUSCULAR | Status: DC | PRN
  Administered 2021-03-16 (×2): 50 ug via INTRAVENOUS

## 2021-03-16 MED ORDER — FENTANYL CITRATE (PF) 100 MCG/2ML IJ SOLN
INTRAMUSCULAR | Status: AC
  Filled 2021-03-16: qty 2

## 2021-03-16 MED ORDER — ONDANSETRON HCL 4 MG/2ML IJ SOLN
INTRAMUSCULAR | Status: AC
  Filled 2021-03-16: qty 2

## 2021-03-16 MED ORDER — PROPOFOL 1000 MG/100ML IV EMUL
INTRAVENOUS | Status: AC
  Filled 2021-03-16: qty 100

## 2021-03-16 MED ORDER — PROMETHAZINE HCL 25 MG/ML IJ SOLN: 6.2500 mg | INTRAMUSCULAR | Status: DC | PRN

## 2021-03-16 MED ORDER — PROPOFOL 10 MG/ML IV BOLUS
INTRAVENOUS | Status: DC | PRN
  Administered 2021-03-16: 30 mg via INTRAVENOUS

## 2021-03-16 MED ORDER — VANCOMYCIN HCL 1500 MG/300ML IV SOLN
1500.0000 mg | Freq: Two times a day (BID) | INTRAVENOUS | Status: DC
  Administered 2021-03-16 – 2021-03-20 (×8): 1500 mg via INTRAVENOUS
  Filled 2021-03-16 (×9): qty 300

## 2021-03-16 MED ORDER — OXYCODONE HCL 5 MG PO TABS: 5.0000 mg | ORAL_TABLET | Freq: Once | ORAL | Status: DC | PRN

## 2021-03-16 MED ORDER — OXYCODONE HCL 5 MG PO TABS
5.0000 mg | ORAL_TABLET | Freq: Four times a day (QID) | ORAL | Status: DC | PRN
  Administered 2021-03-16 – 2021-03-20 (×6): 5 mg via ORAL
  Filled 2021-03-16 (×6): qty 1

## 2021-03-16 MED ORDER — PROPOFOL 500 MG/50ML IV EMUL
INTRAVENOUS | Status: DC | PRN
  Administered 2021-03-16: 100 ug/kg/min via INTRAVENOUS

## 2021-03-16 MED ORDER — OXYCODONE HCL 5 MG/5ML PO SOLN
5.0000 mg | Freq: Once | ORAL | Status: DC | PRN
Start: 2021-03-16 — End: 2021-03-16

## 2021-03-16 MED ORDER — MEPERIDINE HCL 50 MG/ML IJ SOLN: 6.2500 mg | INTRAMUSCULAR | Status: DC | PRN

## 2021-03-16 MED ORDER — MORPHINE SULFATE (PF) 2 MG/ML IV SOLN: 2.0000 mg | INTRAVENOUS | Status: DC | PRN

## 2021-03-16 SURGICAL SUPPLY — 63 items
BAG COUNTER SPONGE SURGICOUNT (BAG) IMPLANT
BLADE HEX COATED 2.75 (ELECTRODE) ×2 IMPLANT
BLADE OSCILLATING/SAGITTAL (BLADE)
BLADE SURG 15 STRL LF DISP TIS (BLADE) ×1 IMPLANT
BLADE SURG 15 STRL SS (BLADE) ×1
BLADE SW THK.38XMED LNG THN (BLADE) IMPLANT
BNDG ELASTIC 3X5.8 VLCR STR LF (GAUZE/BANDAGES/DRESSINGS) ×2 IMPLANT
BNDG ELASTIC 4X5.8 VLCR STR LF (GAUZE/BANDAGES/DRESSINGS) ×2 IMPLANT
BNDG ELASTIC 6X5.8 VLCR STR LF (GAUZE/BANDAGES/DRESSINGS) ×1 IMPLANT
BNDG ESMARK 4X9 LF (GAUZE/BANDAGES/DRESSINGS) ×2 IMPLANT
BNDG GAUZE ELAST 4 BULKY (GAUZE/BANDAGES/DRESSINGS) ×2 IMPLANT
CHLORAPREP W/TINT 26 (MISCELLANEOUS) ×2 IMPLANT
COVER BACK TABLE 60X90IN (DRAPES) ×2 IMPLANT
CUFF TOURN SGL QUICK 18X4 (TOURNIQUET CUFF) IMPLANT
DRAPE EXTREMITY T 121X128X90 (DISPOSABLE) ×2 IMPLANT
DRAPE IMP U-DRAPE 54X76 (DRAPES) ×2 IMPLANT
DRAPE U-SHAPE 47X51 STRL (DRAPES) ×2 IMPLANT
DRSG EMULSION OIL 3X3 NADH (GAUZE/BANDAGES/DRESSINGS) ×2 IMPLANT
DRSG PAD ABDOMINAL 8X10 ST (GAUZE/BANDAGES/DRESSINGS) IMPLANT
ELECT REM PT RETURN 15FT ADLT (MISCELLANEOUS) ×2 IMPLANT
GAUZE 4X4 16PLY ~~LOC~~+RFID DBL (SPONGE) IMPLANT
GAUZE PACKING IODOFORM 1X5 (PACKING) ×1 IMPLANT
GAUZE SPONGE 4X4 12PLY STRL (GAUZE/BANDAGES/DRESSINGS) ×2 IMPLANT
GAUZE XEROFORM 1X8 LF (GAUZE/BANDAGES/DRESSINGS) IMPLANT
GAUZE XEROFORM 5X9 LF (GAUZE/BANDAGES/DRESSINGS) IMPLANT
GLOVE SRG 8 PF TXTR STRL LF DI (GLOVE) ×1 IMPLANT
GLOVE SURG ENC MOIS LTX SZ7.5 (GLOVE) ×2 IMPLANT
GLOVE SURG NEOPR MICRO LF SZ8 (GLOVE) ×2 IMPLANT
GLOVE SURG UNDER POLY LF SZ8 (GLOVE) ×1
GOWN STRL REUS W/ TWL LRG LVL3 (GOWN DISPOSABLE) ×1 IMPLANT
GOWN STRL REUS W/TWL LRG LVL3 (GOWN DISPOSABLE) ×1
GOWN STRL REUS W/TWL XL LVL3 (GOWN DISPOSABLE) ×4 IMPLANT
HANDPIECE INTERPULSE COAX TIP (DISPOSABLE) ×1
KIT BASIN OR (CUSTOM PROCEDURE TRAY) ×2 IMPLANT
MANIFOLD NEPTUNE II (INSTRUMENTS) ×2 IMPLANT
NDL HYPO 25X1 1.5 SAFETY (NEEDLE) ×1 IMPLANT
NEEDLE HYPO 25X1 1.5 SAFETY (NEEDLE) ×2 IMPLANT
NS IRRIG 1000ML POUR BTL (IV SOLUTION) IMPLANT
PACK ORTHO EXTREMITY (CUSTOM PROCEDURE TRAY) ×2 IMPLANT
PADDING CAST ABS 4INX4YD NS (CAST SUPPLIES) ×1
PADDING CAST ABS COTTON 4X4 ST (CAST SUPPLIES) ×1 IMPLANT
PADDING CAST COTTON 6X4 STRL (CAST SUPPLIES) ×2 IMPLANT
PENCIL SMOKE EVACUATOR (MISCELLANEOUS) IMPLANT
SET HNDPC FAN SPRY TIP SCT (DISPOSABLE) IMPLANT
SET IRRIG Y TYPE TUR BLADDER L (SET/KITS/TRAYS/PACK) IMPLANT
SLEEVE SCD COMPRESS KNEE MED (STOCKING) ×2 IMPLANT
SPLINT FIBERGLASS 4X30 (CAST SUPPLIES) ×1 IMPLANT
SPONGE SURGIFOAM ABS GEL 100 (HEMOSTASIS) IMPLANT
STAPLER VISISTAT 35W (STAPLE) ×2 IMPLANT
STOCKINETTE 8 INCH (MISCELLANEOUS) ×4 IMPLANT
SUT ETHILON 2 0 PS N (SUTURE) ×1 IMPLANT
SUT ETHILON 3 0 PS 1 (SUTURE) IMPLANT
SUT ETHILON 4 0 PS 2 18 (SUTURE) IMPLANT
SUT MNCRL AB 3-0 PS2 18 (SUTURE) IMPLANT
SUT MNCRL AB 4-0 PS2 18 (SUTURE) IMPLANT
SUT MON AB 5-0 PS2 18 (SUTURE) IMPLANT
SUT VIC AB 3-0 FS2 27 (SUTURE) ×2 IMPLANT
SUT VIC AB 4-0 PS2 18 (SUTURE) ×2 IMPLANT
SYR BULB EAR ULCER 3OZ GRN STR (SYRINGE) ×2 IMPLANT
SYR CONTROL 10ML LL (SYRINGE) ×2 IMPLANT
TOWEL OR 17X26 10 PK STRL BLUE (TOWEL DISPOSABLE) ×2 IMPLANT
UNDERPAD 30X36 HEAVY ABSORB (UNDERPADS AND DIAPERS) ×2 IMPLANT
YANKAUER SUCT BULB TIP NO VENT (SUCTIONS) ×2 IMPLANT

## 2021-03-16 NOTE — Transfer of Care (Signed)
Immediate Anesthesia Transfer of Care Note  Patient: [name]  Procedure(s) Performed: DEBRIDEMENT LEFT FOOT WOUND (Left: Foot)  Patient Location: PACU  Anesthesia Type:MAC  Level of Consciousness: drowsy  Airway & Oxygen Therapy: Patient Spontanous Breathing and Patient connected to face mask  Post-op Assessment: Report given to RN and Post -op Vital signs reviewed and stable  Post vital signs: Reviewed and stable    Last Vitals:  Vitals Value Taken Time  BP 101/57 03/16/21 1145  Temp    Pulse 64 03/16/21 1146  Resp 13 03/16/21 1146  SpO2 100 % 03/16/21 1146  Vitals shown include unvalidated device data.  Last Pain:  Vitals:   03/16/21 0910  TempSrc: Oral  PainSc:       Patients Stated Pain Goal: 0 (36/43/83 7793)  Complications: No notable events documented.

## 2021-03-16 NOTE — Op Note (Signed)
Patient Name: [name] DOB: 01-22-1956  MRN: [id number]   Date of Surgery: 03/16/21  Surgeon: Dr. Hardie Pulley, DPM Assistants: none  Pre-operative Diagnosis:  Ulcer of left foot with necrosis of bone; osteomyelitis; midfoot Charcot with midfoot dislocation Post-operative Diagnosis:  Same Procedures:  1) Left foot wound debridement and irrigation including bone  2) Navicular bone resection  3) Closed reduction of dislocation with application of splint  Pathology/Specimens: ID Type Source Tests Collected by Time Destination  1 : LEFT FOOT BONE Tissue PATH Soft tissue resection SURGICAL PATHOLOGY Evelina Bucy, DPM 03/16/2021 1112   A : LEFT FOOT TISSUE Tissue PATH Soft tissue resection AEROBIC/ANAEROBIC CULTURE W GRAM STAIN (SURGICAL/DEEP WOUND) Evelina Bucy, DPM 03/16/2021 1053   B : LEFT FOOT BONE Tissue PATH Soft tissue resection AEROBIC/ANAEROBIC CULTURE W GRAM STAIN (SURGICAL/DEEP WOUND) Evelina Bucy, DPM 03/16/2021 1112    Anesthesia: MAC/local Hemostasis: * No tourniquets in log * Estimated Blood Loss: 20 ml Materials: * No implants in log * Medications: 1g vancomycin topical, 1g Vancomycin topical Complications: none  Indications for Procedure:  This is a 65 y.o. male with a large left foot wound.  He has been diagnosed with Charcot and had midfoot dislocation with prominent exposed navicular bone with signs of osteomyelitis.  Discussed to benefit from surgical intervention to control the infection and prevent further worsening ulceration from the pressure of the bone.  Risk benefits alternatives of surgery were discussed the patient.  Procedure in Detail: Patient was identified in pre-operative holding area. Formal consent was signed and the left lower extremity was marked. Patient was brought back to the operating room. Anesthesia was induced. The extremity was prepped and draped in the usual sterile fashion. Timeout was taken to confirm patient name,  laterality, and procedure prior to incision.   Attention was directed to the left foot.  There was a large wound at the medial aspect of the navicular bone with exposed bone.  This bone appeared grossly necrotic.  Rongeur was used to take deep soft tissue and deep bone cultures for microbiology.  Additional specimen was taken for pathology.  The wound was then thoroughly excisionally debrided with 15 blade and rongeur to bleeding viable wound bed.  The wound was irrigated with 1L of NS WITH via pulse lavage. A sagittal saw was used to transect a portion of the prominent navicular that appeared grossly necrotic. The remainder of the navicular bone appeared healthy and more firm in appearance.  Of note the navicular bone was rather loose, with only partial ligamentous integrity noted and the bone rather mobile. After thorough debridement the wound was again thoroughly debrided with an additional 1L of NS via pulse lavage. The wound measured 4 x 4 x 2 cm. The skin edges were then held closer together with retention suture to decreaase the overall size of the wound.  Vancomycin was applied topically to the wound, and the wound was then packed with 1 inch iodoform gauze packing. The foot was then dressed with Betadine 4 x 4's and Kerlix.  The midfoot dislocation was then manually reduced. This was held in position with a well padded 3-sided short leg splint. Patient tolerated the procedure well.  Disposition: Following a period of post-operative monitoring, patient will be transferred back to the floor.  He will benefit from repeat debridement later this week.  He will likely need wound VAC at same time.  Depending upon position of the remainder of the navicular bone he may benefit  from further excision of the navicular bone.  Given concerns for infection pinning of the dislocation is of high risk -hopefully the dislocation can be reduced with the splint.  His dressing to be changed tomorrow and his splint will  be reapplied.  Plan for repeat debridement most likely Friday.

## 2021-03-16 NOTE — Brief Op Note (Signed)
03/16/2021  11:42 AM  PATIENT:  [name]  65 y.o. male  PRE-OPERATIVE DIAGNOSIS:  Osteomyelitis Left Foot  POST-OPERATIVE DIAGNOSIS:  Osteomyelitis Left Foot  PROCEDURE:  Procedure(s): DEBRIDEMENT LEFT FOOT WOUND (Left)  SURGEON:  Surgeon(s) and Role:    * Evelina Bucy, DPM - Primary  PHYSICIAN ASSISTANT:   ASSISTANTS: none   ANESTHESIA:   local and MAC  EBL:  20 ml   BLOOD ADMINISTERED:none  DRAINS: none   LOCAL MEDICATIONS USED:  MARCAINE    and Amount: 10 ml  SPECIMEN:  ID Type Source Tests Collected by Time Destination  1 : LEFT FOOT BONE Tissue PATH Soft tissue resection SURGICAL PATHOLOGY Evelina Bucy, DPM 03/16/2021 1112   A : LEFT FOOT TISSUE Tissue PATH Soft tissue resection AEROBIC/ANAEROBIC CULTURE W GRAM STAIN (SURGICAL/DEEP WOUND) Evelina Bucy, DPM 03/16/2021 1053   B : LEFT FOOT BONE Tissue PATH Soft tissue resection AEROBIC/ANAEROBIC CULTURE W GRAM STAIN (SURGICAL/DEEP WOUND) Evelina Bucy, DPM 03/16/2021 1112      DISPOSITION OF SPECIMEN:  as above  COUNTS:  YES  TOURNIQUET:  * No tourniquets in log *  DICTATION: .Dragon Dictation  PLAN OF CARE: transfer back to floor  PATIENT DISPOSITION:  PACU - hemodynamically stable.   Delay start of Pharmacological VTE agent (>24hrs) due to surgical blood loss or risk of bleeding: not applicable

## 2021-03-16 NOTE — Progress Notes (Signed)
Pharmacy Antibiotic Note  [name] is a 65 y.o. male admitted on 03/14/2021 with left foot ulcer not improving on doxycycline. XR w/o osteo.  03/16/2021 D#3 vancomycin/cefepime/flagyl WBC 11.1, AF, SCr down to 0.79 To OR now for I&D L foot ulcer w/ necrosis of bone, osteomyelitis  Plan: Change vancomycin to Vancomycin 1500 mg IV q12h  Goal AUC 400-550. Expected AUC: 514.5, Css min 13.2 SCr used: 0.8 Cefepime 2 g iv q 8 hours per MD Flagyl 500 mg iv q 12 hours per MD  Will f/u renal function, culture results, and clinical course Levels if/when indicated  Height: 6\' 2"  (188 cm) Weight: 84.7 kg (186 lb 11.2 oz) IBW/kg (Calculated) : 82.2  Temp (24hrs), Avg:98.5 F (36.9 C), Min:97.7 F (36.5 C), Max:99.3 F (37.4 C)  Recent Labs  Lab 03/14/21 0040 03/15/21 0332 03/16/21 0332  WBC 11.9* 10.1 11.1*  CREATININE 1.33* 0.90 0.79     Estimated Creatinine Clearance: 109.9 mL/min (by C-G formula based on SCr of 0.79 mg/dL).    Allergies  Allergen Reactions   Codeine Sulfate [Codeine]     UNSPECIFIED REACTION   Antimicrobials this admission: 10/2 CTX x 1 10/2 vanc >>  10/2 Flagyl >> 10/2 cefepime >>   Dose adjustments this admission: 10/4 V 1750 q24>> 1500 q12 Microbiology results: 10/2 BCx: ngtd 10/3 MRSA neg 10/4 OR culture:   Thank you for allowing pharmacy to be a part of this patient's care.  12/4, Pharm.D 03/16/2021 12:13 PM

## 2021-03-16 NOTE — Interval H&P Note (Signed)
History and Physical Interval Note:  03/16/2021 10:41 AM  [name]  has presented today for surgery, with the diagnosis of Osteomyelitis Left Foot.  The various methods of treatment have been discussed with the patient and family. After consideration of risks, benefits and other options for treatment, the patient has consented to  Procedure(s): DEBRIDEMENT LEFT FOOT WOUND (Left) as a surgical intervention.  The patient's history has been reviewed, patient examined, no change in status, stable for surgery.  I have reviewed the patient's chart and labs.  Questions were answered to the patient's satisfaction.     Park Liter

## 2021-03-16 NOTE — Anesthesia Preprocedure Evaluation (Addendum)
Anesthesia Evaluation  Patient identified by MRN, date of birth, ID band Patient awake    Reviewed: Allergy & Precautions, NPO status , Patient's Chart, lab work & pertinent test results, reviewed documented beta blocker date and time   History of Anesthesia Complications Negative for: history of anesthetic complications  Airway Mallampati: II  TM Distance: >3 FB Neck ROM: Full    Dental  (+) Dental Advisory Given   Pulmonary neg pulmonary ROS,    breath sounds clear to auscultation       Cardiovascular hypertension, Pt. on medications and Pt. on home beta blockers + CAD and + Past MI   Rhythm:Regular Rate:Normal  Echo 12/2017  Mitral valve: Dilated mitral annulus. Leaflet thickening/calcification is present. Mild mitral annular calcification. Mild regurgitation.   Aortic valve: The valve is trileaflet. Mild valve thickening present. No stenosis. No regurgitation.   Tricuspid valve: Trace regurgitation.   Septum: Small Patent Foramen Ovale present with bi-directional shunt visualized by color doppler and saline contrast.   Left atrium: Patent foramen ovale present with bi-directional shunting indicated by color flow Doppler and saline contrast.   Left atrium: Spontaneous echo contrast.   Right ventricle: Normal wall thickness and ejection fraction. Cavity is mildly dilated.     Neuro/Psych negative neurological ROS  negative psych ROS   GI/Hepatic Neg liver ROS, GERD  ,  Endo/Other  diabetes, Type 2, Insulin Dependent  Renal/GU negative Renal ROS     Musculoskeletal  (+) Arthritis ,   Abdominal   Peds  Hematology   Anesthesia Other Findings   Reproductive/Obstetrics                            Anesthesia Physical  Anesthesia Plan  ASA: 3  Anesthesia Plan: MAC   Post-op Pain Management:    Induction: Intravenous  PONV Risk Score and Plan: 2 and Treatment may vary due to age  or medical condition, Propofol infusion, TIVA, Ondansetron and Midazolam  Airway Management Planned: Natural Airway  Additional Equipment: None  Intra-op Plan:   Post-operative Plan:   Informed Consent: I have reviewed the patients History and Physical, chart, labs and discussed the procedure including the risks, benefits and alternatives for the proposed anesthesia with the patient or authorized representative who has indicated his/her understanding and acceptance.     Dental advisory given  Plan Discussed with: CRNA  Anesthesia Plan Comments:         Anesthesia Quick Evaluation

## 2021-03-16 NOTE — Progress Notes (Signed)
Inpatient Diabetes Program Recommendations  AACE/ADA: New Consensus Statement on Inpatient Glycemic Control (2015)  Target Ranges:  Prepandial:   less than 140 mg/dL      Peak postprandial:   less than 180 mg/dL (1-2 hours)      Critically ill patients:  140 - 180 mg/dL   Lab Results  Component Value Date   GLUCAP 221 (H) 03/16/2021   HGBA1C 8.2 (H) 03/15/2021     Review of Glycemic Control Results for [name], Colin A "MILT" (MRN [id number]) as of 03/16/2021 11:46  Ref. Range 03/15/2021 07:22 03/15/2021 11:32 03/15/2021 16:26 03/15/2021 22:22 03/16/2021 07:29 03/16/2021 09:14  Glucose-Capillary Latest Ref Range: 70 - 99 mg/dL 244 (H) 975 (H) 300 (H) 374 (H) 206 (H) 221 (H)   Diabetes history: DM 2 Outpatient Diabetes medications: Lantus 25 units bid, Metformin 850 mg bid, Januvia 100 mg Daily Current orders for Inpatient glycemic control:  Semglee 8 units Novolog 0-15 units tid + hs  A1c 8.2% on 10/3  Inpatient Diabetes Program Recommendations:    -  Increase Semglee to 15 units  Thanks,  Christena Deem RN, MSN, BC-ADM Inpatient Diabetes Coordinator Team Pager [phone] (8a-5p)

## 2021-03-16 NOTE — Anesthesia Procedure Notes (Signed)
Procedure Name: MAC Date/Time: 03/16/2021 11:10 AM Performed by: Claudia Desanctis, CRNA Pre-anesthesia Checklist: Patient identified, Emergency Drugs available, Suction available and Patient being monitored Patient Re-evaluated:Patient Re-evaluated prior to induction Oxygen Delivery Method: Simple face mask

## 2021-03-16 NOTE — Progress Notes (Signed)
[facility] PROGRESS NOTE   [name] UTM:[id number] DOB: 1955-11-02 DOA: 03/14/2021  PCP: [name], MD  Brief History/Interval Summary: 65 y.o. male with medical history significant of DM2, HLD, HTN. Presenting with left foot ulcer. This ulcer has shown up in the last week or two. It's in the medial side of the left foot. He was referred by his PCP to wound care. He was started on santyl dressings and doxycycline. The wound did not seem to improve. So he was seen by podiatry. They recommended getting an MRI and vascular studies. That was scheduled to happen this week.  Patient presented to the emergency department due to onset of fever.  He was hospitalized for further management.    Consultants: Podiatry  Procedures: Plan is for debridement in the OR later today.    Subjective/Interval History: Denies any pain in the left foot.  Denies any chest pain shortness of breath.  Looking forward to surgery.     Assessment/Plan:  Diabetic foot ulcer involving left foot with concern for osteomyelitis WBC was noted to be elevated at 11.9.  CRP 6.0.  ESR 50. Plain x-rays were done which did not show any discrete bony erosive changes suggesting osteomyelitis. 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  Possible early osteomyelitis of the medial navicular bone was noted. Patient seen by podiatry and plan is for debridement today. ABI shows mild left lower extremity arterial disease.  Will defer this to podiatry to determine if vascular surgery needs to be involved. Remains on vancomycin and cefepime and metronidazole.  Follow-up on cultures.  Podiatry is planning to do a bone cultures today.  WBC is mildly elevated 11.1. Patient does not have any significant pain in the lower extremities.  Normocytic anemia Possibly dilutional drop in hemoglobin.  No evidence of overt bleeding.  Monitor  periodically for now.  Acute kidney injury Creatinine was 1.33 on presentation with a BUN of 36.  Improved with IV hydration.  Diabetes mellitus type 2, uncontrolled with hyperglycemia HbA1c 8.2.  He uses Lantus insulin at home along with Jardiance and metformin.  Currently noted to be on just SSI.   Started on glargine at lower dose yesterday due to his n.p.o. status.  CBGs noted to be elevated.  Leave him on current dose for today and then adjust tomorrow depending on CBG values and clinical status.  Essential hypertension Continue to monitor blood pressures.  Lisinopril was placed on hold.  He is on metoprolol which is being continued.  Hyperlipidemia Was on statin at home.   DVT Prophylaxis: Lovenox Code Status: Full code Family Communication: Discussed with the patient Disposition Plan: Hopefully return home in a few days.  Will likely need PT and OT evaluation after surgery when cleared to do so by podiatry.  Status is: Inpatient  Remains inpatient appropriate because:IV treatments appropriate due to intensity of illness or inability to take PO and Inpatient level of care appropriate due to severity of illness  Dispo: The patient is from: Home              Anticipated d/c is to: Home              Patient currently is not medically stable to d/c.   Difficult to place patient No       Medications: Scheduled:  [MAR Hold] atorvastatin  80 mg Oral Daily   [MAR Hold] enoxaparin (LOVENOX) injection  40  mg Subcutaneous Q24H   [MAR Hold] insulin aspart  0-15 Units Subcutaneous TID WC   [MAR Hold] insulin aspart  0-5 Units Subcutaneous QHS   [MAR Hold] insulin glargine-yfgn  8 Units Subcutaneous Daily   [MAR Hold] metoprolol succinate  50 mg Oral Daily   Continuous:  [MAR Hold] sodium chloride 1,000 mL (03/15/21 0433)   [MAR Hold] ceFEPime (MAXIPIME) IV 2 g (03/16/21 0446)   lactated ringers 50 mL/hr at 03/16/21 0920   [MAR Hold] metronidazole Stopped (03/16/21 0748)    [MAR Hold] vancomycin Stopped (03/16/21 0858)   PRN:[MAR Hold] sodium chloride, [MAR Hold] acetaminophen **OR** [MAR Hold] acetaminophen, [MAR Hold] ondansetron **OR** [MAR Hold] ondansetron (ZOFRAN) IV  Antibiotics: Anti-infectives (From admission, onward)    Start     Dose/Rate Route Frequency Ordered Stop   03/15/21 0000  [MAR Hold]  vancomycin (VANCOREADY) IVPB 1750 mg/350 mL        (MAR Hold since Tue 03/16/2021 at 0914.Hold Reason: Transfer to a Procedural area)   1,750 mg 175 mL/hr over 120 Minutes Intravenous Every 24 hours 03/14/21 1014     03/14/21 1130  [MAR Hold]  ceFEPIme (MAXIPIME) 2 g in sodium chloride 0.9 % 100 mL IVPB        (MAR Hold since Tue 03/16/2021 at 0914.Hold Reason: Transfer to a Procedural area)   2 g 200 mL/hr over 30 Minutes Intravenous Every 8 hours 03/14/21 1034     03/14/21 1130  [MAR Hold]  metroNIDAZOLE (FLAGYL) IVPB 500 mg        (MAR Hold since Tue 03/16/2021 at 0914.Hold Reason: Transfer to a Procedural area)   500 mg 100 mL/hr over 60 Minutes Intravenous Every 12 hours 03/14/21 1034     03/14/21 1100  piperacillin-tazobactam (ZOSYN) IVPB 3.375 g  Status:  Discontinued        3.375 g 12.5 mL/hr over 240 Minutes Intravenous Every 8 hours 03/14/21 1014 03/14/21 1034   03/14/21 0101  vancomycin (VANCOCIN) 1000 MG powder       Note to Pharmacy: [name]   : cabinet override      03/14/21 0101 03/14/21 0341   03/14/21 0100  vancomycin (VANCOREADY) IVPB 1500 mg/300 mL  Status:  Discontinued        1,500 mg 150 mL/hr over 120 Minutes Intravenous  Once 03/14/21 0053 03/14/21 0055   03/14/21 0100  cefTRIAXone (ROCEPHIN) 2 g in sodium chloride 0.9 % 100 mL IVPB        2 g 200 mL/hr over 30 Minutes Intravenous  Once 03/14/21 0053 03/14/21 0244   03/14/21 0100  vancomycin (VANCOCIN) 1,500 mg in sodium chloride 0.9 % 500 mL IVPB        1,500 mg 250 mL/hr over 120 Minutes Intravenous  Once 03/14/21 0055 03/14/21 0341   03/14/21 0100  vancomycin (VANCOCIN)  500 MG powder       Note to Pharmacy: [name]   : cabinet override      03/14/21 0100 03/14/21 0341       Objective:  Vital Signs  Vitals:   03/15/21 2228 03/16/21 0432 03/16/21 0556 03/16/21 0910  BP:  (!) 149/83 133/72 (!) 142/84  Pulse:  80 88 77  Resp:  '16 18 18  ' Temp:  98.4 F (36.9 C) 99.3 F (37.4 C) 98.7 F (37.1 C)  TempSrc: Oral Oral Oral Oral  SpO2:  98% 98% 98%  Weight:      Height:        Intake/Output  Summary (Last 24 hours) at 03/16/2021 0937 Last data filed at 03/16/2021 0754 Gross per 24 hour  Intake 1147.73 ml  Output --  Net 1147.73 ml    Filed Weights   03/14/21 0039  Weight: 84.7 kg    General appearance: Awake alert.  In no distress Resp: Clear to auscultation bilaterally.  Normal effort Cardio: S1-S2 is normal regular.  No S3-S4.  No rubs murmurs or bruit GI: Abdomen is soft.  Nontender nondistended.  Bowel sounds are present normal.  No masses organomegaly Extremities: Left foot covered in dressing Neurologic: Alert and oriented x3.  No focal neurological deficits.    Lab Results:  Data Reviewed: I have personally reviewed following labs and imaging studies  CBC: Recent Labs  Lab 03/14/21 0040 03/15/21 0332 03/16/21 0332  WBC 11.9* 10.1 11.1*  NEUTROABS 9.1*  --   --   HGB 11.8* 10.6* 12.6*  HCT 35.9* 32.7* 39.2  MCV 93.7 95.3 96.3  PLT 263 209 253     Basic Metabolic Panel: Recent Labs  Lab 03/14/21 0040 03/15/21 0332 03/16/21 0332  NA 133* 135 138  K 4.3 4.1 4.8  CL 101 107 104  CO2 '24 23 25  ' GLUCOSE 238* 177* 237*  BUN 36* 22 22  CREATININE 1.33* 0.90 0.79  CALCIUM 8.6* 8.1* 9.0     GFR: Estimated Creatinine Clearance: 109.9 mL/min (by C-G formula based on SCr of 0.79 mg/dL).  Liver Function Tests: Recent Labs  Lab 03/15/21 0332  AST 12*  ALT 15  ALKPHOS 60  BILITOT 0.7  PROT 5.7*  ALBUMIN 2.7*    HbA1C: Recent Labs    03/15/21 0332  HGBA1C 8.2*     CBG: Recent Labs  Lab  03/15/21 1132 03/15/21 1626 03/15/21 2222 03/16/21 0729 03/16/21 0914  GLUCAP 274* 171* 374* 206* 221*      Recent Results (from the past 240 hour(s))  Blood culture (routine x 2)     Status: None (Preliminary result)   Collection Time: 03/14/21 12:40 AM   Specimen: BLOOD  Result Value Ref Range Status   Specimen Description   Final    BLOOD RIGHT ANTECUBITAL Performed at [facility], [address] [place], [place] [address]    Special Requests   Final    BOTTLES DRAWN AEROBIC AND ANAEROBIC Blood Culture adequate volume Performed at [facility], [address] [place], [place] [address]    Culture   Final    NO GROWTH 2 DAYS Performed at [facility], [place] [address], [place], [place] [address]    Report Status PENDING  Incomplete  Blood culture (routine x 2)     Status: None (Preliminary result)   Collection Time: 03/14/21  1:10 AM   Specimen: BLOOD LEFT FOREARM  Result Value Ref Range Status   Specimen Description   Final    BLOOD LEFT FOREARM Performed at [facility], [place] [address], [place], [place] [address]    Special Requests   Final    BOTTLES DRAWN AEROBIC AND ANAEROBIC Blood Culture adequate volume Performed at [facility], [address], [place], [place] [address]    Culture   Final    NO GROWTH 2 DAYS Performed at [facility], [place] [address], [place], [place] [address]    Report Status PENDING  Incomplete  Resp Panel by RT-PCR (Flu A&B, Covid) Nasopharyngeal Swab     Status: None   Collection Time: 03/14/21  1:10  AM   Specimen: Nasopharyngeal Swab; Nasopharyngeal(NP) swabs in vial transport medium  Result Value Ref Range Status   SARS Coronavirus 2 by RT PCR NEGATIVE NEGATIVE Final    Comment: (NOTE) SARS-CoV-2 target nucleic acids are NOT DETECTED.  The SARS-CoV-2 RNA is generally detectable in upper respiratory specimens during the acute phase of infection. The  lowest concentration of SARS-CoV-2 viral copies this assay can detect is 138 copies/mL. A negative result does not preclude SARS-Cov-2 infection and should not be used as the sole basis for treatment or other patient management decisions. A negative result may occur with  improper specimen collection/handling, submission of specimen other than nasopharyngeal swab, presence of viral mutation(s) within the areas targeted by this assay, and inadequate number of viral copies(<138 copies/mL). A negative result must be combined with clinical observations, patient history, and epidemiological information. The expected result is Negative.  Fact Sheet for Patients:  EntrepreneurPulse.com.au  Fact Sheet for Healthcare Providers:  IncredibleEmployment.be  This test is no t yet approved or cleared by the [place] FDA and  has been authorized for detection and/or diagnosis of SARS-CoV-2 by FDA under an Emergency Use Authorization (EUA). This EUA will remain  in effect (meaning this test can be used) for the duration of the COVID-19 declaration under Section 564(b)(1) of the Act, 21 U.S.C.section 360bbb-3(b)(1), unless the authorization is terminated  or revoked sooner.       Influenza A by PCR NEGATIVE NEGATIVE Final   Influenza B by PCR NEGATIVE NEGATIVE Final    Comment: (NOTE) The Xpert Xpress SARS-CoV-2/FLU/RSV plus assay is intended as an aid in the diagnosis of influenza from Nasopharyngeal swab specimens and should not be used as a sole basis for treatment. Nasal washings and aspirates are unacceptable for Xpert Xpress SARS-CoV-2/FLU/RSV testing.  Fact Sheet for Patients: EntrepreneurPulse.com.au  Fact Sheet for Healthcare Providers: IncredibleEmployment.be  This test is not yet approved or cleared by the [place] FDA and has been authorized for detection and/or diagnosis of SARS-CoV-2 by FDA under  an Emergency Use Authorization (EUA). This EUA will remain in effect (meaning this test can be used) for the duration of the COVID-19 declaration under Section 564(b)(1) of the Act, 21 U.S.C. section 360bbb-3(b)(1), unless the authorization is terminated or revoked.  Performed at [facility], [address] [place], [place] [address]   MRSA Next Gen by PCR, Nasal     Status: None   Collection Time: 03/15/21  4:30 AM   Specimen: Nasal Mucosa; Nasal Swab  Result Value Ref Range Status   MRSA by PCR Next Gen NOT DETECTED NOT DETECTED Final    Comment: (NOTE) The GeneXpert MRSA Assay (FDA approved for NASAL specimens only), is one component of a comprehensive MRSA colonization surveillance program. It is not intended to diagnose MRSA infection nor to guide or monitor treatment for MRSA infections. Test performance is not FDA approved in patients less than 24 years old. Performed at [facility], [place] [address], [place], [place] [address]        Radiology Studies: MR FOOT LEFT WO CONTRAST  Result Date: 03/14/2021 CLINICAL DATA:  Medial foot ulcer. EXAM: MRI OF THE LEFT FOOT WITHOUT CONTRAST TECHNIQUE: Multiplanar, multisequence MR imaging of the left ankle was performed. No intravenous contrast was administered. COMPARISON:  Left foot x-rays from same day. Left ankle x-rays dated March 01, 2021. FINDINGS: Bones/Joint/Cartilage Medial dislocation of the navicular with respect to the kidney a forms, as well as the calcaneus with  respect to the cuboid. Mild impaction fracture of the anterolateral calcaneus (series 8, image 18). Patchy marrow edema involving the calcaneus, cuboid, cuneiforms, and navicular, likely related to neuropathic arthropathy. No joint effusion. Ligaments Lisfranc ligament is intact. Muscles and Tendons Flexor and extensor tendons are intact. Split tear of the peroneal brevis tendon (series 5, image 17). Intact Achilles tendon  with mild tendinosis. Soft tissue Soft tissue ulceration of the medial midfoot overlying the dislocated navicular. Diffuse soft tissue swelling. No fluid collection or hematoma. No soft tissue mass. IMPRESSION: 1. 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 2. Soft tissue ulceration of the medial midfoot overlying the dislocated navicular, extending to bone. Probable early osteomyelitis of the medial navicular given exposed bone. No abscess. 3. Impaction fracture of the anterolateral calcaneus. 4. Split tear of the peroneal brevis tendon. Electronically Signed   By: Titus Dubin M.D.   On: 03/14/2021 12:50   VAS [place] ABI WITH/WO TBI  Result Date: 03/15/2021  LOWER EXTREMITY DOPPLER STUDY Patient Name:  ROMULUS HANRAHAN  Date of Exam:   03/15/2021 Medical Rec #: [id number]        Accession #:    [id number] Date of Birth: 1956/02/26        Patient Gender: M Patient Age:   61 years Exam Location:  [facility] Procedure:      VAS [place] ABI WITH/WO TBI Referring Phys: Cherylann Ratel --------------------------------------------------------------------------------  Indications: Ulceration. High Risk Factors: Hypertension, Diabetes.  Comparison Study: No prior studies. Performing Technologist: Carlos Levering RVT  Examination Guidelines: A complete evaluation includes at minimum, Doppler waveform signals and systolic blood pressure reading at the level of bilateral brachial, anterior tibial, and posterior tibial arteries, when vessel segments are accessible. Bilateral testing is considered an integral part of a complete examination. Photoelectric Plethysmograph (PPG) waveforms and toe systolic pressure readings are included as required and additional duplex testing as needed. Limited examinations for reoccurring indications may be performed as noted.  ABI Findings: +---------+------------------+-----+---------+--------+ Right    Rt Pressure (mmHg)IndexWaveform Comment   +---------+------------------+-----+---------+--------+ Brachial 152                    triphasic         +---------+------------------+-----+---------+--------+ PTA      169               1.11 triphasic         +---------+------------------+-----+---------+--------+ DP       148               0.97 triphasic         +---------+------------------+-----+---------+--------+ Great Toe83                0.55                   +---------+------------------+-----+---------+--------+ +---------+------------------+-----+---------+-------+ Left     Lt Pressure (mmHg)IndexWaveform Comment +---------+------------------+-----+---------+-------+ Brachial 139                    triphasic        +---------+------------------+-----+---------+-------+ PTA      117               0.77 triphasic        +---------+------------------+-----+---------+-------+ DP       131               0.86 triphasic        +---------+------------------+-----+---------+-------+ Renford Dills  0.36                  +---------+------------------+-----+---------+-------+ +-------+-----------+-----------+------------+------------+ ABI/TBIToday's ABIToday's TBIPrevious ABIPrevious TBI +-------+-----------+-----------+------------+------------+ Right  1.11       0.55                                +-------+-----------+-----------+------------+------------+ Left   0.86       0.36                                +-------+-----------+-----------+------------+------------+  Summary: Right: Resting right ankle-brachial index is within normal range. No evidence of significant right lower extremity arterial disease. The right toe-brachial index is abnormal. Left: Resting left ankle-brachial index indicates mild left lower extremity arterial disease. The left toe-brachial index is abnormal.  *See table(s) above for measurements and observations.  Electronically signed by Servando Snare MD on  03/15/2021 at 4:55:57 PM.    Final        LOS: 2 days   [facility] Pager on [email or web address]  03/16/2021, 9:37 AM

## 2021-03-16 NOTE — Telephone Encounter (Signed)
Called patient's wife and daughter post-operatively to discuss surgery. All questions answered.

## 2021-03-17 ENCOUNTER — Other Ambulatory Visit (HOSPITAL_BASED_OUTPATIENT_CLINIC_OR_DEPARTMENT_OTHER): Payer: Self-pay

## 2021-03-17 ENCOUNTER — Encounter (HOSPITAL_COMMUNITY): Payer: Self-pay | Admitting: Podiatry

## 2021-03-17 DIAGNOSIS — M869 Osteomyelitis, unspecified: Secondary | ICD-10-CM | POA: Diagnosis not present

## 2021-03-17 DIAGNOSIS — E11621 Type 2 diabetes mellitus with foot ulcer: Secondary | ICD-10-CM | POA: Diagnosis not present

## 2021-03-17 DIAGNOSIS — L97529 Non-pressure chronic ulcer of other part of left foot with unspecified severity: Secondary | ICD-10-CM | POA: Diagnosis not present

## 2021-03-17 LAB — CBC
HCT: 34.5 % — ABNORMAL LOW (ref 39.0–52.0)
Hemoglobin: 11.2 g/dL — ABNORMAL LOW (ref 13.0–17.0)
MCH: 30.8 pg (ref 26.0–34.0)
MCHC: 32.5 g/dL (ref 30.0–36.0)
MCV: 94.8 fL (ref 80.0–100.0)
Platelets: 245 10*3/uL (ref 150–400)
RBC: 3.64 MIL/uL — ABNORMAL LOW (ref 4.22–5.81)
RDW: 12.8 % (ref 11.5–15.5)
WBC: 8.5 10*3/uL (ref 4.0–10.5)
nRBC: 0 % (ref 0.0–0.2)

## 2021-03-17 LAB — GLUCOSE, CAPILLARY
Glucose-Capillary: 131 mg/dL — ABNORMAL HIGH (ref 70–99)
Glucose-Capillary: 203 mg/dL — ABNORMAL HIGH (ref 70–99)
Glucose-Capillary: 177 mg/dL — ABNORMAL HIGH (ref 70–99)
Glucose-Capillary: 194 mg/dL — ABNORMAL HIGH (ref 70–99)

## 2021-03-17 LAB — BASIC METABOLIC PANEL
Anion gap: 8 (ref 5–15)
BUN: 22 mg/dL (ref 8–23)
CO2: 23 mmol/L (ref 22–32)
Calcium: 8.6 mg/dL — ABNORMAL LOW (ref 8.9–10.3)
Chloride: 103 mmol/L (ref 98–111)
Creatinine, Ser: 0.93 mg/dL (ref 0.61–1.24)
GFR, Estimated: >60 mL/min (ref >60)
Glucose, Bld: 116 mg/dL — ABNORMAL HIGH (ref 70–99)
Potassium: 4.3 mmol/L (ref 3.5–5.1)
Sodium: 134 mmol/L — ABNORMAL LOW (ref 135–145)

## 2021-03-17 NOTE — TOC Initial Note (Signed)
Transition of Care [facility]) - Initial/Assessment Note    Patient Details  Name: [name] MRN: [id number] Date of Birth: [birth year]  Transition of Care [facility]) CM/SW Contact:    Golda Acre, RN Phone Number: 03/17/2021, 9:36 AM  Clinical Narrative:                 For possible second debridement of foot on [id number] fllowing for needs.  Rolling walker and tub bench ordered through [employer].  Expected Discharge Plan: Home w Home Health Services Barriers to Discharge: Continued Medical Work up   Patient Goals and CMS Choice Patient states their goals for this hospitalization and ongoing recovery are:: to go back home      Expected Discharge Plan and Services Expected Discharge Plan: Home w Home Health Services   Discharge Planning Services: CM Consult Post Acute Care Choice: Durable Medical Equipment, Home Health Living arrangements for the past 2 months: Single Family Home                 DME Arranged: Walker rolling, Tub bench DME Agency: [employer] Date DME Agency Contacted: 03/17/21 Time DME Agency Contacted: [phone] Representative spoke with at DME Agency: zack blanki            Prior Living Arrangements/Services Living arrangements for the past 2 months: Single Family Home Lives with:: Spouse Patient language and need for interpreter reviewed:: Yes Do you feel safe going back to the place where you live?: Yes      Need for Family Participation in Patient Care: No (Comment)     Criminal Activity/Legal Involvement Pertinent to Current Situation/Hospitalization: No - Comment as needed  Activities of Daily Living Home Assistive Devices/Equipment: Cane (specify quad or straight) (straight) ADL Screening (condition at time of admission) Patient's cognitive ability adequate to safely complete daily activities?: Yes Is the patient deaf or have difficulty hearing?: No Does the patient have difficulty seeing, even when wearing glasses/contacts?: No Does the patient  have difficulty concentrating, remembering, or making decisions?: No Patient able to express need for assistance with ADLs?: Yes Does the patient have difficulty dressing or bathing?: No Independently performs ADLs?: Yes (appropriate for developmental age) Does the patient have difficulty walking or climbing stairs?: Yes Weakness of Legs: None Weakness of Arms/Hands: None  Permission Sought/Granted                  Emotional Assessment Appearance:: Appears stated age     Orientation: : Oriented to Self, Oriented to Place, Oriented to  Time, Oriented to Situation Alcohol / Substance Use: Not Applicable Psych Involvement: No (comment)  Admission diagnosis:  Osteomyelitis (HCC) [M86.9] Fever in adult [R50.9] Charcot's joint of left foot [M14.672] Non-healing wound of lower extremity, left, initial encounter [S81.802A] Diabetic foot ulcer (HCC) [S56.812, L97.509] Patient Active Problem List   Diagnosis Date Noted   Osteomyelitis (HCC) 03/14/2021   Diabetic foot ulcer (HCC) 03/14/2021   S/P CABG x 3 12/26/2017   Coronary artery disease 12/11/2017   GERD (gastroesophageal reflux disease) 12/04/2017   Controlled type 2 diabetes mellitus with neuropathy (HCC) 12/04/2017   Hypercholesterolemia 12/04/2017   Abnormal EKG 12/04/2017   Hypertension 12/04/2017   PCP:  [name], MD Pharmacy:   [phone] Rosalita Levan,  - [address] [place] [address] Phone: [phone] Fax: [phone]     Social Determinants of Health (SDOH) Interventions    Readmission Risk Interventions No flowsheet data found.

## 2021-03-17 NOTE — Progress Notes (Addendum)
Subjective:  Patient ID: [name], male    DOB: Dec 31, 1955,  MRN: [id number]  Patient seen bedside. Had some burning yesterday in the foot area. No pain today. Felt a little warm but remains afebrile. Objective:   Vitals:   03/17/21 0554 03/17/21 1341  BP: (!) 142/82 (!) 154/86  Pulse: 81 81  Resp: 16 18  Temp: 98.3 F (36.8 C) 98.3 F (36.8 C)  SpO2: 97% 99%   General AA&O x3. Normal mood and affect.  Vascular Dorsalis pedis and posterior tibial pulses 2/4 bilat. Brisk capillary refill to all digits. Pedal hair present.  Neurologic Epicritic sensation grossly absent.  Dermatologic Dressing changed - the navicular bone remains exposed and has a small portion of discoloration concerning for necrosis.  There was no purulence expressible today. Wound with decreasing erythema, scant necrosis  Orthopedic: MMT 5/5 in dorsiflexion, plantarflexion, inversion, and eversion.    Results for orders placed or performed during the hospital encounter of 03/14/21 (from the past 24 hour(s))  Glucose, capillary     Status: Abnormal   Collection Time: 03/16/21  9:07 PM  Result Value Ref Range   Glucose-Capillary 201 (H) 70 - 99 mg/dL  CBC     Status: Abnormal   Collection Time: 03/17/21  3:26 AM  Result Value Ref Range   WBC 8.5 4.0 - 10.5 K/uL   RBC 3.64 (L) 4.22 - 5.81 MIL/uL   Hemoglobin 11.2 (L) 13.0 - 17.0 g/dL   HCT 24.5 (L) 80.9 - 98.3 %   MCV 94.8 80.0 - 100.0 fL   MCH 30.8 26.0 - 34.0 pg   MCHC 32.5 30.0 - 36.0 g/dL   RDW 38.2 50.5 - 39.7 %   Platelets 245 150 - 400 K/uL   nRBC 0.0 0.0 - 0.2 %  Basic metabolic panel     Status: Abnormal   Collection Time: 03/17/21  3:26 AM  Result Value Ref Range   Sodium 134 (L) 135 - 145 mmol/L   Potassium 4.3 3.5 - 5.1 mmol/L   Chloride 103 98 - 111 mmol/L   CO2 23 22 - 32 mmol/L   Glucose, Bld 116 (H) 70 - 99 mg/dL   BUN 22 8 - 23 mg/dL   Creatinine, Ser 6.73 0.61 - 1.24 mg/dL   Calcium 8.6 (L) 8.9 - 10.3 mg/dL   GFR, Estimated  >41 >93 mL/min   Anion gap 8 5 - 15  Glucose, capillary     Status: Abnormal   Collection Time: 03/17/21  7:54 AM  Result Value Ref Range   Glucose-Capillary 131 (H) 70 - 99 mg/dL  Glucose, capillary     Status: Abnormal   Collection Time: 03/17/21 11:47 AM  Result Value Ref Range   Glucose-Capillary 203 (H) 70 - 99 mg/dL  Glucose, capillary     Status: Abnormal   Collection Time: 03/17/21  5:07 PM  Result Value Ref Range   Glucose-Capillary 177 (H) 70 - 99 mg/dL   Assessment & Plan:  Patient was evaluated and treated and all questions answered.  Left foot ulceration, osteomyelitis in setting of acute Charcot -Labs cultures reviewed. Pending - Staph epi on bone so far. -It appears that the foot has again dislocated.  I was unable to further reduce this at bedside.  Unfortunately, given active wound, with known osteomyelitis pin fixation is a high risk of infection including seeding infection to bones not currently affected. The bone remains exposed and has a small portion of discoloration concerning for necrosis.  There was no purulence expressible today.  We discussed severity of condition including the real possibility of limb loss but patient wishes to further proceed with limb salvage.  I am optimistic about his prospects of limb salvage but his case does remain difficult. -He would benefit from repeat debridement of the wound later this week.  We will plan for wound VAC application at that time.  He may benefit from further navicular resection including full navicular resection if indicated however we discussed that this would make later reconstruction more difficult.  We will decide intraoperatively. -Plan to continue antibiotics, planned return to the operating room on Friday, will likely need long-term ntibiotics with infectious disease consult once cultures are finalized.  We will continue to follow.  Park Liter, DPM  Accessible via secure chat for questions or concerns.

## 2021-03-17 NOTE — Progress Notes (Signed)
PROGRESS NOTE    [name]  IHK:[id number] DOB: 25-Oct-1955 DOA: 03/14/2021 PCP: [name], MD   Brief Narrative:  65 y.o. male with medical history significant of DM2, HLD, HTN. Presenting with left foot ulcer. This ulcer has shown up in the last week or two. It's in the medial side of the left foot. He was referred by his PCP to wound care. He was started on santyl dressings and doxycycline. The wound did not seem to improve. So he was seen by podiatry. They recommended getting an MRI and vascular studies. That was scheduled to happen this week.  Patient presented to the emergency department due to onset of fever.  He was hospitalized for further management.    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 intake given above, creatinine now resolving back to baseline, continue to follow with morning labs    Diabetes mellitus type 2, uncontrolled with hyperglycemia - HbA1c 8.2.  - He uses Lantus insulin at home along with Jardiance and metformin.   -Continue sliding scale insulin, given multiple days perioperatively with poor p.o. intake we will hold off on adjusting long-acting insulin   Essential hypertension -Continue metoprolol only, hold other home medications  Hyperlipidemia Was on statin at home.   DVT  prophylaxis: Lovenox Code Status: Full Family Communication: None present  Status is: Inpatient  Dispo: The patient is from: Home              Anticipated d/c is to: To be determined              Anticipated d/c date is: 48 to 72 hours              Patient currently not medically stable for discharge  Consultants:  Podiatry  Procedures:  Debridement 03/16/2021  Antimicrobials:  Vancomycin, cefepime, Flagyl  Subjective: No acute issues or events overnight tolerated procedure quite well denies nausea vomiting diarrhea constipation headache fevers chills or chest pain  Objective: Vitals:   03/16/21 1544 03/16/21 2105 03/17/21 0114 03/17/21 0554  BP: (!) 175/85 (!) 144/84 122/72 (!) 142/82  Pulse: 73 73 73 81  Resp: 16 16 16 16   Temp: 97.6 F (36.4 C) 98 F (36.7 C) 98 F (36.7 C) 98.3 F (36.8 C)  TempSrc: Oral Oral Oral Oral  SpO2: 100% 99% 97% 97%  Weight:      Height:        Intake/Output Summary (Last 24 hours) at 03/17/2021 0743 Last data filed at 03/17/2021 0700 Gross per 24 hour  Intake 3130.13 ml  Output 2075 ml  Net 1055.13 ml   Filed Weights   03/14/21 0039  Weight: 84.7 kg    Examination:  General exam: Appears calm and comfortable  Respiratory system: Clear to auscultation. Respiratory effort normal. Cardiovascular system: S1 & S2 heard, RRR. No JVD, murmurs, rubs, gallops or clicks. No pedal edema. Gastrointestinal system: Abdomen is nondistended, soft and nontender. No organomegaly or masses felt. Normal bowel  sounds heard. Central nervous system: Alert and oriented. No focal neurological deficits. Extremities: Symmetric 5 x 5 power. Skin: No rashes, lesions or ulcers Psychiatry: Judgement and insight appear normal. Mood & affect appropriate.     Data Reviewed: I have personally reviewed following labs and imaging studies  CBC: Recent Labs  Lab 03/14/21 0040 03/15/21 0332 03/16/21 0332 03/17/21 0326  WBC 11.9* 10.1 11.1* 8.5   NEUTROABS 9.1*  --   --   --   HGB 11.8* 10.6* 12.6* 11.2*  HCT 35.9* 32.7* 39.2 34.5*  MCV 93.7 95.3 96.3 94.8  PLT 263 209 253 245   Basic Metabolic Panel: Recent Labs  Lab 03/14/21 0040 03/15/21 0332 03/16/21 0332 03/17/21 0326  NA 133* 135 138 134*  K 4.3 4.1 4.8 4.3  CL 101 107 104 103  CO2 24 23 25 23   GLUCOSE 238* 177* 237* 116*  BUN 36* 22 22 22   CREATININE 1.33* 0.90 0.79 0.93  CALCIUM 8.6* 8.1* 9.0 8.6*   GFR: Estimated Creatinine Clearance: 94.5 mL/min (by C-G formula based on SCr of 0.93 mg/dL). Liver Function Tests: Recent Labs  Lab 03/15/21 0332  AST 12*  ALT 15  ALKPHOS 60  BILITOT 0.7  PROT 5.7*  ALBUMIN 2.7*   No results for input(s): LIPASE, AMYLASE in the last 168 hours. No results for input(s): AMMONIA in the last 168 hours. Coagulation Profile: No results for input(s): INR, PROTIME in the last 168 hours. Cardiac Enzymes: No results for input(s): CKTOTAL, CKMB, CKMBINDEX, TROPONINI in the last 168 hours. BNP (last 3 results) No results for input(s): PROBNP in the last 8760 hours. HbA1C: Recent Labs    03/15/21 0332  HGBA1C 8.2*   CBG: Recent Labs  Lab 03/16/21 0729 03/16/21 0914 03/16/21 1152 03/16/21 1651 03/16/21 2107  GLUCAP 206* 221* 151* 193* 201*   Lipid Profile: No results for input(s): CHOL, HDL, LDLCALC, TRIG, CHOLHDL, LDLDIRECT in the last 72 hours. Thyroid Function Tests: No results for input(s): TSH, T4TOTAL, FREET4, T3FREE, THYROIDAB in the last 72 hours. Anemia Panel: No results for input(s): VITAMINB12, FOLATE, FERRITIN, TIBC, IRON, RETICCTPCT in the last 72 hours. Sepsis Labs: No results for input(s): PROCALCITON, LATICACIDVEN in the last 168 hours.  Recent Results (from the past 240 hour(s))  Blood culture (routine x 2)     Status: None (Preliminary result)   Collection Time: 03/14/21 12:40 AM   Specimen: BLOOD  Result Value Ref Range Status   Specimen Description   Final    BLOOD RIGHT  ANTECUBITAL Performed at [facility], [address], [place], [address] [place]    Special Requests   Final    BOTTLES DRAWN AEROBIC AND ANAEROBIC Blood Culture adequate volume Performed at [facility], [address] [place], [address] [place]    Culture   Final    NO GROWTH 3 DAYS Performed at [facility], [address] [place], [address] [place]    Report Status PENDING  Incomplete  Blood culture (routine x 2)     Status: None (Preliminary result)   Collection Time: 03/14/21  1:10 AM   Specimen: BLOOD LEFT FOREARM  Result Value Ref Range Status   Specimen Description   Final    BLOOD LEFT FOREARM Performed at [facility], [address], [place], [address] [place]    Special Requests   Final    BOTTLES DRAWN AEROBIC AND ANAEROBIC Blood Culture adequate volume Performed at [facility], [phone]  [address] [place], [place] [address]    Culture   Final    NO GROWTH 3 DAYS Performed at [facility], [address], [place], [place] [address]    Report Status PENDING  Incomplete  Resp Panel by RT-PCR (Flu A&B, Covid) Nasopharyngeal Swab     Status: None   Collection Time: 03/14/21  1:10 AM   Specimen: Nasopharyngeal Swab; Nasopharyngeal(NP) swabs in vial transport medium  Result Value Ref Range Status   SARS Coronavirus 2 by RT PCR NEGATIVE NEGATIVE Final    Comment: (NOTE) SARS-CoV-2 target nucleic acids are NOT DETECTED.  The SARS-CoV-2 RNA is generally detectable in upper respiratory specimens during the acute phase of infection. The lowest concentration of SARS-CoV-2 viral copies this assay can detect is 138 copies/mL. A negative result does not preclude SARS-Cov-2 infection and should not be used as the sole basis for treatment or other patient management decisions. A negative result may occur with  improper specimen collection/handling, submission of specimen other than nasopharyngeal  swab, presence of viral mutation(s) within the areas targeted by this assay, and inadequate number of viral copies(<138 copies/mL). A negative result must be combined with clinical observations, patient history, and epidemiological information. The expected result is Negative.  Fact Sheet for Patients:  BloggerCourse.com  Fact Sheet for Healthcare Providers:  SeriousBroker.it  This test is no t yet approved or cleared by the [place] FDA and  has been authorized for detection and/or diagnosis of SARS-CoV-2 by FDA under an Emergency Use Authorization (EUA). This EUA will remain  in effect (meaning this test can be used) for the duration of the COVID-19 declaration under Section 564(b)(1) of the Act, 21 U.S.C.section 360bbb-3(b)(1), unless the authorization is terminated  or revoked sooner.       Influenza A by PCR NEGATIVE NEGATIVE Final   Influenza B by PCR NEGATIVE NEGATIVE Final    Comment: (NOTE) The Xpert Xpress SARS-CoV-2/FLU/RSV plus assay is intended as an aid in the diagnosis of influenza from Nasopharyngeal swab specimens and should not be used as a sole basis for treatment. Nasal washings and aspirates are unacceptable for Xpert Xpress SARS-CoV-2/FLU/RSV testing.  Fact Sheet for Patients: BloggerCourse.com  Fact Sheet for Healthcare Providers: SeriousBroker.it  This test is not yet approved or cleared by the [place] FDA and has been authorized for detection and/or diagnosis of SARS-CoV-2 by FDA under an Emergency Use Authorization (EUA). This EUA will remain in effect (meaning this test can be used) for the duration of the COVID-19 declaration under Section 564(b)(1) of the Act, 21 U.S.C. section 360bbb-3(b)(1), unless the authorization is terminated or revoked.  Performed at [facility], [address], [place], [place] [address]    MRSA Next Gen by PCR, Nasal     Status: None   Collection Time: 03/15/21  4:30 AM   Specimen: Nasal Mucosa; Nasal Swab  Result Value Ref Range Status   MRSA by PCR Next Gen NOT DETECTED NOT DETECTED Final    Comment: (NOTE) The GeneXpert MRSA Assay (FDA approved for NASAL specimens only), is one component of a comprehensive MRSA colonization surveillance program. It is not intended to diagnose MRSA infection nor to guide or monitor treatment for MRSA infections. Test performance is not FDA approved in patients less than 25 years old. Performed at [facility], [address], [place], [place] [address]   Aerobic/Anaerobic Culture w Gram Stain (surgical/deep wound)     Status: None (Preliminary result)   Collection  Time: 03/16/21 10:53 AM   Specimen: PATH Soft tissue resection  Result Value Ref Range Status   Specimen Description   Final    TISSUE LEFT FOOT Performed at [facility], [address] [place], [place] [address]    Special Requests   Final    NONE Performed at [facility], [address], [place], [place] [address]    Gram Stain   Final    FEW WBC PRESENT,BOTH PMN AND MONONUCLEAR NO ORGANISMS SEEN Performed at [facility], [address] [place], [place] [address]    Culture PENDING  Incomplete   Report Status PENDING  Incomplete         Radiology Studies: DG Foot 2 Views Left  Result Date: 03/16/2021 CLINICAL DATA:  Postoperative left foot surgery EXAM: LEFT FOOT - 2 VIEW COMPARISON:  03/14/2021 FINDINGS: AP and lateral radiographs of the left foot obtained with overlying splint material. Interval partial navicular resection. Naviculocuneiform and calcaneocuboid joints remain dislocated. No new fractures. Expected postoperative changes within the soft tissues. IMPRESSION: 1. Interval partial navicular resection. 2. Naviculocuneiform and calcaneocuboid joints remain dislocated. Electronically  Signed   By: [name] D.O.   On: 03/16/2021 15:48   VAS [place] ABI WITH/WO TBI  Result Date: 03/15/2021  LOWER EXTREMITY DOPPLER STUDY Patient Name:  [name]  Date of Exam:   03/15/2021 Medical Rec #: [id number]        Accession #:    [id number] Date of Birth: [birth year]        Patient Gender: M Patient Age:   56 years Exam Location:  [facility] Procedure:      VAS [place] ABI WITH/WO TBI Referring Phys: Margie Ege --------------------------------------------------------------------------------  Indications: Ulceration. High Risk Factors: Hypertension, Diabetes.  Comparison Study: No prior studies. Performing Technologist: Olen Cordial RVT  Examination Guidelines: A complete evaluation includes at minimum, Doppler waveform signals and systolic blood pressure reading at the level of bilateral brachial, anterior tibial, and posterior tibial arteries, when vessel segments are accessible. Bilateral testing is considered an integral part of a complete examination. Photoelectric Plethysmograph (PPG) waveforms and toe systolic pressure readings are included as required and additional duplex testing as needed. Limited examinations for reoccurring indications may be performed as noted.  ABI Findings: +---------+------------------+-----+---------+--------+ Right    Rt Pressure (mmHg)IndexWaveform Comment  +---------+------------------+-----+---------+--------+ Brachial 152                    triphasic         +---------+------------------+-----+---------+--------+ PTA      169               1.11 triphasic         +---------+------------------+-----+---------+--------+ DP       148               0.97 triphasic         +---------+------------------+-----+---------+--------+ Great Toe83                0.55                   +---------+------------------+-----+---------+--------+ +---------+------------------+-----+---------+-------+ Left     Lt Pressure (mmHg)IndexWaveform  Comment +---------+------------------+-----+---------+-------+ Brachial 139                    triphasic        +---------+------------------+-----+---------+-------+ PTA      117  0.77 triphasic        +---------+------------------+-----+---------+-------+ DP       131               0.86 triphasic        +---------+------------------+-----+---------+-------+ Great Toe55                0.36                  +---------+------------------+-----+---------+-------+ +-------+-----------+-----------+------------+------------+ ABI/TBIToday's ABIToday's TBIPrevious ABIPrevious TBI +-------+-----------+-----------+------------+------------+ Right  1.11       0.55                                +-------+-----------+-----------+------------+------------+ Left   0.86       0.36                                +-------+-----------+-----------+------------+------------+  Summary: Right: Resting right ankle-brachial index is within normal range. No evidence of significant right lower extremity arterial disease. The right toe-brachial index is abnormal. Left: Resting left ankle-brachial index indicates mild left lower extremity arterial disease. The left toe-brachial index is abnormal.  *See table(s) above for measurements and observations.  Electronically signed by Lemar Livings MD on 03/15/2021 at 4:55:57 PM.    Final     Scheduled Meds:  atorvastatin  80 mg Oral Daily   enoxaparin (LOVENOX) injection  40 mg Subcutaneous Q24H   insulin aspart  0-15 Units Subcutaneous TID WC   insulin aspart  0-5 Units Subcutaneous QHS   insulin glargine-yfgn  8 Units Subcutaneous Daily   metoprolol succinate  50 mg Oral Daily   Continuous Infusions:  sodium chloride Stopped (03/16/21 0806)   ceFEPime (MAXIPIME) IV 200 mL/hr at 03/17/21 0400   metronidazole Stopped (03/16/21 2358)   vancomycin Stopped (03/17/21 0302)     LOS: 3 days    Time spent:   Azucena Fallen, DO [facility]  If 7PM-7AM, please contact night-coverage [email or web address]  03/17/2021, 7:43 AM

## 2021-03-17 NOTE — Anesthesia Postprocedure Evaluation (Signed)
Anesthesia Post Note  Patient: [name]  Procedure(s) Performed: DEBRIDEMENT LEFT FOOT WOUND (Left: Foot)     Patient location during evaluation: PACU Anesthesia Type: MAC Level of consciousness: awake and alert Pain management: pain level controlled Vital Signs Assessment: post-procedure vital signs reviewed and stable Respiratory status: spontaneous breathing Cardiovascular status: stable Anesthetic complications: no   No notable events documented.  Last Vitals:  Vitals:   03/17/21 0554 03/17/21 1341  BP: (!) 142/82 (!) 154/86  Pulse: 81 81  Resp: 16 18  Temp: 36.8 C 36.8 C  SpO2: 97% 99%    Last Pain:  Vitals:   03/17/21 1341  TempSrc: Oral  PainSc:                  Nolon Nations

## 2021-03-18 ENCOUNTER — Encounter: Payer: Self-pay | Admitting: Podiatry

## 2021-03-18 DIAGNOSIS — E11621 Type 2 diabetes mellitus with foot ulcer: Secondary | ICD-10-CM | POA: Diagnosis not present

## 2021-03-18 DIAGNOSIS — L97529 Non-pressure chronic ulcer of other part of left foot with unspecified severity: Secondary | ICD-10-CM | POA: Diagnosis not present

## 2021-03-18 DIAGNOSIS — M869 Osteomyelitis, unspecified: Secondary | ICD-10-CM | POA: Diagnosis not present

## 2021-03-18 LAB — GLUCOSE, CAPILLARY
Glucose-Capillary: 181 mg/dL — ABNORMAL HIGH (ref 70–99)
Glucose-Capillary: 240 mg/dL — ABNORMAL HIGH (ref 70–99)
Glucose-Capillary: 259 mg/dL — ABNORMAL HIGH (ref 70–99)
Glucose-Capillary: 256 mg/dL — ABNORMAL HIGH (ref 70–99)

## 2021-03-18 LAB — CBC
HCT: 35.3 % — ABNORMAL LOW (ref 39.0–52.0)
Hemoglobin: 11.4 g/dL — ABNORMAL LOW (ref 13.0–17.0)
MCH: 30.5 pg (ref 26.0–34.0)
MCHC: 32.3 g/dL (ref 30.0–36.0)
MCV: 94.4 fL (ref 80.0–100.0)
Platelets: 252 10*3/uL (ref 150–400)
RBC: 3.74 MIL/uL — ABNORMAL LOW (ref 4.22–5.81)
RDW: 12.9 % (ref 11.5–15.5)
WBC: 7.4 10*3/uL (ref 4.0–10.5)
nRBC: 0 % (ref 0.0–0.2)

## 2021-03-18 LAB — BASIC METABOLIC PANEL
Anion gap: 5 (ref 5–15)
BUN: 20 mg/dL (ref 8–23)
CO2: 26 mmol/L (ref 22–32)
Calcium: 8.8 mg/dL — ABNORMAL LOW (ref 8.9–10.3)
Chloride: 104 mmol/L (ref 98–111)
Creatinine, Ser: 0.92 mg/dL (ref 0.61–1.24)
GFR, Estimated: >60 mL/min (ref >60)
Glucose, Bld: 200 mg/dL — ABNORMAL HIGH (ref 70–99)
Potassium: 4.6 mmol/L (ref 3.5–5.1)
Sodium: 135 mmol/L (ref 135–145)

## 2021-03-18 LAB — SURGICAL PATHOLOGY

## 2021-03-18 NOTE — Plan of Care (Signed)
  Problem: Activity: Goal: Risk for activity intolerance will decrease Outcome: Progressing   Problem: Pain Managment: Goal: General experience of comfort will improve Outcome: Progressing   Problem: Safety: Goal: Ability to remain free from injury will improve Outcome: Progressing   

## 2021-03-18 NOTE — H&P (View-Only) (Signed)
  Subjective:  Patient ID: [name], male    DOB: 03/14/1956,  MRN: [id number]  Patient seen bedside. No pain today. Does state that he has not had a bowel movement, but denies other complaints. Objective:   Vitals:   03/18/21 0448 03/18/21 0916  BP: 126/72 133/82  Pulse: 75 86  Resp: 17 18  Temp: 98.7 F (37.1 C) 98.2 F (36.8 C)  SpO2: 97% 98%   General AA&O x3. Normal mood and affect.  Vascular Dorsalis pedis and posterior tibial pulses 2/4 bilat. Brisk capillary refill to all digits. Pedal hair present.  Neurologic Epicritic sensation grossly absent.  Dermatologic Dressing c/d/I. No strikethrough.  Orthopedic: MMT 5/5 in dorsiflexion, plantarflexion, inversion, and eversion.    Results for orders placed or performed during the hospital encounter of 03/14/21 (from the past 24 hour(s))  Glucose, capillary     Status: Abnormal   Collection Time: 03/17/21  9:24 PM  Result Value Ref Range   Glucose-Capillary 194 (H) 70 - 99 mg/dL  CBC     Status: Abnormal   Collection Time: 03/18/21  3:25 AM  Result Value Ref Range   WBC 7.4 4.0 - 10.5 K/uL   RBC 3.74 (L) 4.22 - 5.81 MIL/uL   Hemoglobin 11.4 (L) 13.0 - 17.0 g/dL   HCT 65.7 (L) 84.6 - 96.2 %   MCV 94.4 80.0 - 100.0 fL   MCH 30.5 26.0 - 34.0 pg   MCHC 32.3 30.0 - 36.0 g/dL   RDW 95.2 84.1 - 32.4 %   Platelets 252 150 - 400 K/uL   nRBC 0.0 0.0 - 0.2 %  Basic metabolic panel     Status: Abnormal   Collection Time: 03/18/21  3:25 AM  Result Value Ref Range   Sodium 135 135 - 145 mmol/L   Potassium 4.6 3.5 - 5.1 mmol/L   Chloride 104 98 - 111 mmol/L   CO2 26 22 - 32 mmol/L   Glucose, Bld 200 (H) 70 - 99 mg/dL   BUN 20 8 - 23 mg/dL   Creatinine, Ser 4.01 0.61 - 1.24 mg/dL   Calcium 8.8 (L) 8.9 - 10.3 mg/dL   GFR, Estimated >02 >72 mL/min   Anion gap 5 5 - 15  Glucose, capillary     Status: Abnormal   Collection Time: 03/18/21  7:52 AM  Result Value Ref Range   Glucose-Capillary 181 (H) 70 - 99 mg/dL  Glucose,  capillary     Status: Abnormal   Collection Time: 03/18/21 12:54 PM  Result Value Ref Range   Glucose-Capillary 240 (H) 70 - 99 mg/dL  Glucose, capillary     Status: Abnormal   Collection Time: 03/18/21  5:02 PM  Result Value Ref Range   Glucose-Capillary 259 (H) 70 - 99 mg/dL   Assessment & Plan:  Patient was evaluated and treated and all questions answered.  Left foot ulceration, osteomyelitis in setting of acute Charcot -Labs cultures reviewed. Pending - Staph epi on bone so far. -Dressing intact today without strikethrough - left intact -Discussed again plan tomorrow for debridement, partial vs total navicular resection, VAC, abx beads. Patient in agreement and understanding of plan. -Continue empiric IV abx. If the entire bone is removed patient may not needed extended IV abx and could be discharged on PO abx alone. -Will continue to follow -NPO after 10AM tomorrow for late afternoon/early eve surgery.  Park Liter, DPM  Accessible via secure chat for questions or concerns.

## 2021-03-18 NOTE — Progress Notes (Signed)
PROGRESS NOTE    [name]  EXN:[id number] DOB: 1955-06-17 DOA: 03/14/2021 PCP: [name], MD   Brief Narrative:  65 y.o. male with medical history significant of DM2, HLD, HTN. Presenting with left foot ulcer. This ulcer has shown up in the last week or two. It's in the medial side of the left foot. He was referred by his PCP to wound care. He was started on santyl dressings and doxycycline. The wound did not seem to improve. So he was seen by podiatry. They recommended getting an MRI and vascular studies. That was scheduled to happen this week.  Patient presented to the emergency department due to onset of fever.  He was hospitalized for further management.    Assessment & Plan:  Diabetic foot ulcer involving left foot secondary to osteomyelitis, POA Presumed staph epi pending cultures -Podiatry following, appreciate insight recommendations -Osteo-with dislocated navicular cuneiform and calcaneocuboid joints -Status postdebridement 03/16/2021, potential reevaluation and debridement on 03/18/2021 -Pain currently well controlled -Continue vancomycin, cefepime, Flagyl pending cultures we will de-escalate appropriately -Tentative plan for repeat debridement and washout on 03/19/2021 per podiatry  Normocytic anemia -Likely hemodilution all, follow repeat labs no signs or symptoms of bleeding at this point, bandage clean dry intact  Acute kidney injury -In the setting of poor p.o. intake given above, creatinine now resolving back to baseline, continue to follow with morning labs    Diabetes mellitus type 2, uncontrolled with hyperglycemia - HbA1c 8.2  - He uses Lantus insulin at home along with Jardiance and metformin.   -Continue sliding scale insulin, given multiple days perioperatively with poor p.o. intake we will hold off on adjusting long-acting insulin   Essential hypertension -Continue metoprolol only, hold other home medications  Hyperlipidemia Was on statin at home.    DVT prophylaxis: Lovenox Code Status: Full Family Communication: None present  Status is: Inpatient  Dispo: The patient is from: Home              Anticipated d/c is to: To be determined              Anticipated d/c date is: 48 to 72 hours              Patient currently not medically stable for discharge  Consultants:  Podiatry  Procedures:  Debridement 03/16/2021 Tentative repeat debridement 03/19/2021  Antimicrobials:  Vancomycin, cefepime, Flagyl  Subjective: No acute issues or events overnight, patient denies nausea vomiting diarrhea constipation headache fevers chills chest pain shortness of breath.  Foot pain currently well controlled on current regimen.  Objective: Vitals:   03/17/21 1341 03/17/21 2003 03/18/21 0238 03/18/21 0448  BP: (!) 154/86 (!) 152/80 138/78 126/72  Pulse: 81 81 76 75  Resp: 18 18 17 17   Temp: 98.3 F (36.8 C) 99.1 F (37.3 C) 98.5 F (36.9 C) 98.7 F (37.1 C)  TempSrc: Oral Oral Oral Oral  SpO2: 99% 98% 98% 97%  Weight:      Height:        Intake/Output Summary (Last 24 hours) at 03/18/2021 0737 Last data filed at 03/18/2021 0600 Gross per 24 hour  Intake 1614.79 ml  Output 1350 ml  Net 264.79 ml    Filed Weights   03/14/21 0039  Weight: 84.7 kg    Examination:  General exam: Appears calm and comfortable  Respiratory system: Clear to auscultation. Respiratory effort normal. Cardiovascular system: S1 & S2 heard, RRR. No JVD, murmurs, rubs, gallops or clicks. No pedal edema. Gastrointestinal system: Abdomen  is nondistended, soft and nontender. No organomegaly or masses felt. Normal bowel sounds heard. Central nervous system: Alert and oriented. No focal neurological deficits. Extremities: Symmetric 5 x 5 power. Skin: No rashes, lesions or ulcers Psychiatry: Judgement and insight appear normal. Mood & affect appropriate.   Data Reviewed: I have personally reviewed following labs and imaging studies  CBC: Recent Labs  Lab  03/14/21 0040 03/15/21 0332 03/16/21 0332 03/17/21 0326 03/18/21 0325  WBC 11.9* 10.1 11.1* 8.5 7.4  NEUTROABS 9.1*  --   --   --   --   HGB 11.8* 10.6* 12.6* 11.2* 11.4*  HCT 35.9* 32.7* 39.2 34.5* 35.3*  MCV 93.7 95.3 96.3 94.8 94.4  PLT 263 209 253 245 252    Basic Metabolic Panel: Recent Labs  Lab 03/14/21 0040 03/15/21 0332 03/16/21 0332 03/17/21 0326 03/18/21 0325  NA 133* 135 138 134* 135  K 4.3 4.1 4.8 4.3 4.6  CL 101 107 104 103 104  CO2 24 23 25 23 26   GLUCOSE 238* 177* 237* 116* 200*  BUN 36* 22 22 22 20   CREATININE 1.33* 0.90 0.79 0.93 0.92  CALCIUM 8.6* 8.1* 9.0 8.6* 8.8*    GFR: Estimated Creatinine Clearance: 95.6 mL/min (by C-G formula based on SCr of 0.92 mg/dL). Liver Function Tests: Recent Labs  Lab 03/15/21 0332  AST 12*  ALT 15  ALKPHOS 60  BILITOT 0.7  PROT 5.7*  ALBUMIN 2.7*    No results for input(s): LIPASE, AMYLASE in the last 168 hours. No results for input(s): AMMONIA in the last 168 hours. Coagulation Profile: No results for input(s): INR, PROTIME in the last 168 hours. Cardiac Enzymes: No results for input(s): CKTOTAL, CKMB, CKMBINDEX, TROPONINI in the last 168 hours. BNP (last 3 results) No results for input(s): PROBNP in the last 8760 hours. HbA1C: No results for input(s): HGBA1C in the last 72 hours.  CBG: Recent Labs  Lab 03/16/21 2107 03/17/21 0754 03/17/21 1147 03/17/21 1707 03/17/21 2124  GLUCAP 201* 131* 203* 177* 194*    Lipid Profile: No results for input(s): CHOL, HDL, LDLCALC, TRIG, CHOLHDL, LDLDIRECT in the last 72 hours. Thyroid Function Tests: No results for input(s): TSH, T4TOTAL, FREET4, T3FREE, THYROIDAB in the last 72 hours. Anemia Panel: No results for input(s): VITAMINB12, FOLATE, FERRITIN, TIBC, IRON, RETICCTPCT in the last 72 hours. Sepsis Labs: No results for input(s): PROCALCITON, LATICACIDVEN in the last 168 hours.  Recent Results (from the past 240 hour(s))  Blood culture (routine x  2)     Status: None (Preliminary result)   Collection Time: 03/14/21 12:40 AM   Specimen: BLOOD  Result Value Ref Range Status   Specimen Description   Final    BLOOD RIGHT ANTECUBITAL Performed at [facility], [address], [place], [address] [place]    Special Requests   Final    BOTTLES DRAWN AEROBIC AND ANAEROBIC Blood Culture adequate volume Performed at [facility], [address] [place], [address] [place]    Culture   Final    NO GROWTH 3 DAYS Performed at [facility], [address], [place], [address] [place]    Report Status PENDING  Incomplete  Blood culture (routine x 2)     Status: None (Preliminary result)   Collection Time: 03/14/21  1:10 AM   Specimen: BLOOD LEFT FOREARM  Result Value Ref Range Status   Specimen Description   Final    BLOOD LEFT FOREARM Performed at [facility], [address] [place], [place] [address]    Special Requests   Final    BOTTLES DRAWN AEROBIC AND ANAEROBIC Blood Culture adequate volume Performed at [facility], [address] [place], [place] [address]    Culture   Final    NO GROWTH 3 DAYS Performed at [facility], [address], [place], [place] [address]    Report Status PENDING  Incomplete  Resp Panel by RT-PCR (Flu A&B, Covid) Nasopharyngeal Swab     Status: None   Collection Time: 03/14/21  1:10 AM   Specimen: Nasopharyngeal Swab; Nasopharyngeal(NP) swabs in vial transport medium  Result Value Ref Range Status   SARS Coronavirus 2 by RT PCR NEGATIVE NEGATIVE Final    Comment: (NOTE) SARS-CoV-2 target nucleic acids are NOT DETECTED.  The SARS-CoV-2 RNA is generally detectable in upper respiratory specimens during the acute phase of infection. The lowest concentration of SARS-CoV-2 viral copies this assay can detect is 138 copies/mL. A negative result does not preclude SARS-Cov-2 infection and should not be used as the sole basis  for treatment or other patient management decisions. A negative result may occur with  improper specimen collection/handling, submission of specimen other than nasopharyngeal swab, presence of viral mutation(s) within the areas targeted by this assay, and inadequate number of viral copies(<138 copies/mL). A negative result must be combined with clinical observations, patient history, and epidemiological information. The expected result is Negative.  Fact Sheet for Patients:  BloggerCourse.com  Fact Sheet for Healthcare Providers:  SeriousBroker.it  This test is no t yet approved or cleared by the [place] FDA and  has been authorized for detection and/or diagnosis of SARS-CoV-2 by FDA under an Emergency Use Authorization (EUA). This EUA will remain  in effect (meaning this test can be used) for the duration of the COVID-19 declaration under Section 564(b)(1) of the Act, 21 U.S.C.section 360bbb-3(b)(1), unless the authorization is terminated  or revoked sooner.       Influenza A by PCR NEGATIVE NEGATIVE Final   Influenza B by PCR NEGATIVE NEGATIVE Final    Comment: (NOTE) The Xpert Xpress SARS-CoV-2/FLU/RSV plus assay is intended as an aid in the diagnosis of influenza from Nasopharyngeal swab specimens and should not be used as a sole basis for treatment. Nasal washings and aspirates are unacceptable for Xpert Xpress SARS-CoV-2/FLU/RSV testing.  Fact Sheet for Patients: BloggerCourse.com  Fact Sheet for Healthcare Providers: SeriousBroker.it  This test is not yet approved or cleared by the [place] FDA and has been authorized for detection and/or diagnosis of SARS-CoV-2 by FDA under an Emergency Use Authorization (EUA). This EUA will remain in effect (meaning this test can be used) for the duration of the COVID-19 declaration under Section 564(b)(1) of the Act, 21  U.S.C. section 360bbb-3(b)(1), unless the authorization is terminated or revoked.  Performed at [facility], [address] [place], [place] [address]   MRSA Next Gen by PCR, Nasal     Status: None   Collection Time: 03/15/21  4:30 AM   Specimen: Nasal Mucosa; Nasal Swab  Result Value Ref Range Status   MRSA by PCR Next Gen NOT DETECTED NOT DETECTED Final    Comment: (NOTE) The GeneXpert MRSA Assay (FDA approved for NASAL specimens only), is one component of a comprehensive MRSA colonization surveillance program. It is not intended to diagnose MRSA infection nor to guide or monitor treatment for MRSA infections. Test performance is not FDA approved in patients less than 22 years old.  Performed at [facility], [address] [place], [place] [address]   Aerobic/Anaerobic Culture w Gram Stain (surgical/deep wound)     Status: None (Preliminary result)   Collection Time: 03/16/21 10:53 AM   Specimen: PATH Soft tissue resection  Result Value Ref Range Status   Specimen Description   Final    TISSUE LEFT FOOT Performed at [facility], [address], [place], [place] [address]    Special Requests   Final    NONE Performed at [facility], [address], [place], [place] [address]    Gram Stain   Final    FEW WBC PRESENT,BOTH PMN AND MONONUCLEAR NO ORGANISMS SEEN    Culture   Final    NO GROWTH < 24 HOURS Performed at [facility], [address], [place], [place] [address]    Report Status PENDING  Incomplete  Aerobic/Anaerobic Culture w Gram Stain (surgical/deep wound)     Status: None (Preliminary result)   Collection Time: 03/16/21 11:12 AM   Specimen: PATH Soft tissue resection  Result Value Ref Range Status   Specimen Description   Final    FOOT LEFT BONE Performed at [facility], [address], [place], [place] [address]    Special Requests   Final     NONE Performed at [facility], [address] [place], [place] [address]    Gram Stain   Final    WBC PRESENT, PREDOMINANTLY MONONUCLEAR NO ORGANISMS SEEN    Culture   Final    FEW STAPHYLOCOCCUS EPIDERMIDIS CULTURE REINCUBATED FOR BETTER GROWTH Performed at [facility], [address] [place], [place] [address]    Report Status PENDING  Incomplete          Radiology Studies: DG Foot 2 Views Left  Result Date: 03/16/2021 CLINICAL DATA:  Postoperative left foot surgery EXAM: LEFT FOOT - 2 VIEW COMPARISON:  03/14/2021 FINDINGS: AP and lateral radiographs of the left foot obtained with overlying splint material. Interval partial navicular resection. Naviculocuneiform and calcaneocuboid joints remain dislocated. No new fractures. Expected postoperative changes within the soft tissues. IMPRESSION: 1. Interval partial navicular resection. 2. Naviculocuneiform and calcaneocuboid joints remain dislocated. Electronically Signed   By: [name] D.O.   On: 03/16/2021 15:48    Scheduled Meds:  atorvastatin  80 mg Oral Daily   enoxaparin (LOVENOX) injection  40 mg Subcutaneous Q24H   insulin aspart  0-15 Units Subcutaneous TID WC   insulin aspart  0-5 Units Subcutaneous QHS   insulin glargine-yfgn  8 Units Subcutaneous Daily   metoprolol succinate  50 mg Oral Daily   Continuous Infusions:  sodium chloride Stopped (03/16/21 0806)   ceFEPime (MAXIPIME) IV 2 g (03/18/21 0507)   metronidazole 500 mg (03/17/21 2324)   vancomycin 1,500 mg (03/18/21 0246)     LOS: 4 days    Time spent:   Azucena Fallen, DO [facility]  If 7PM-7AM, please contact night-coverage [email or web address]  03/18/2021, 7:37 AM

## 2021-03-18 NOTE — Progress Notes (Addendum)
  Subjective:  Patient ID: [name], male    DOB: 04/05/1956,  MRN: [id number]  Patient seen bedside. No pain today. Does state that he has not had a bowel movement, but denies other complaints. Objective:   Vitals:   03/18/21 0448 03/18/21 0916  BP: 126/72 133/82  Pulse: 75 86  Resp: 17 18  Temp: 98.7 F (37.1 C) 98.2 F (36.8 C)  SpO2: 97% 98%   General AA&O x3. Normal mood and affect.  Vascular Dorsalis pedis and posterior tibial pulses 2/4 bilat. Brisk capillary refill to all digits. Pedal hair present.  Neurologic Epicritic sensation grossly absent.  Dermatologic Dressing c/d/I. No strikethrough.  Orthopedic: MMT 5/5 in dorsiflexion, plantarflexion, inversion, and eversion.    Results for orders placed or performed during the hospital encounter of 03/14/21 (from the past 24 hour(s))  Glucose, capillary     Status: Abnormal   Collection Time: 03/17/21  9:24 PM  Result Value Ref Range   Glucose-Capillary 194 (H) 70 - 99 mg/dL  CBC     Status: Abnormal   Collection Time: 03/18/21  3:25 AM  Result Value Ref Range   WBC 7.4 4.0 - 10.5 K/uL   RBC 3.74 (L) 4.22 - 5.81 MIL/uL   Hemoglobin 11.4 (L) 13.0 - 17.0 g/dL   HCT 35.3 (L) 39.0 - 52.0 %   MCV 94.4 80.0 - 100.0 fL   MCH 30.5 26.0 - 34.0 pg   MCHC 32.3 30.0 - 36.0 g/dL   RDW 12.9 11.5 - 15.5 %   Platelets 252 150 - 400 K/uL   nRBC 0.0 0.0 - 0.2 %  Basic metabolic panel     Status: Abnormal   Collection Time: 03/18/21  3:25 AM  Result Value Ref Range   Sodium 135 135 - 145 mmol/L   Potassium 4.6 3.5 - 5.1 mmol/L   Chloride 104 98 - 111 mmol/L   CO2 26 22 - 32 mmol/L   Glucose, Bld 200 (H) 70 - 99 mg/dL   BUN 20 8 - 23 mg/dL   Creatinine, Ser 0.92 0.61 - 1.24 mg/dL   Calcium 8.8 (L) 8.9 - 10.3 mg/dL   GFR, Estimated >60 >60 mL/min   Anion gap 5 5 - 15  Glucose, capillary     Status: Abnormal   Collection Time: 03/18/21  7:52 AM  Result Value Ref Range   Glucose-Capillary 181 (H) 70 - 99 mg/dL  Glucose,  capillary     Status: Abnormal   Collection Time: 03/18/21 12:54 PM  Result Value Ref Range   Glucose-Capillary 240 (H) 70 - 99 mg/dL  Glucose, capillary     Status: Abnormal   Collection Time: 03/18/21  5:02 PM  Result Value Ref Range   Glucose-Capillary 259 (H) 70 - 99 mg/dL   Assessment & Plan:  Patient was evaluated and treated and all questions answered.  Left foot ulceration, osteomyelitis in setting of acute Charcot -Labs cultures reviewed. Pending - Staph epi on bone so far. -Dressing intact today without strikethrough - left intact -Discussed again plan tomorrow for debridement, partial vs total navicular resection, VAC, abx beads. Patient in agreement and understanding of plan. -Continue empiric IV abx. If the entire bone is removed patient may not needed extended IV abx and could be discharged on PO abx alone. -Will continue to follow -NPO after 10AM tomorrow for late afternoon/early eve surgery.  Nailea Whitehorn J Margery Szostak, DPM  Accessible via secure chat for questions or concerns.  

## 2021-03-19 ENCOUNTER — Encounter (HOSPITAL_COMMUNITY)
Admission: EM | Disposition: A | Discharge status: Home Health | Payer: Self-pay | Source: Home / Self Care | Attending: Internal Medicine

## 2021-03-19 ENCOUNTER — Telehealth: Payer: Self-pay | Admitting: Podiatry

## 2021-03-19 ENCOUNTER — Inpatient Hospital Stay (HOSPITAL_COMMUNITY): Payer: BC Managed Care – PPO | Admitting: Anesthesiology

## 2021-03-19 ENCOUNTER — Ambulatory Visit: Payer: BC Managed Care – PPO | Admitting: Sports Medicine

## 2021-03-19 ENCOUNTER — Encounter (HOSPITAL_COMMUNITY): Payer: Self-pay | Admitting: Internal Medicine

## 2021-03-19 DIAGNOSIS — L97529 Non-pressure chronic ulcer of other part of left foot with unspecified severity: Secondary | ICD-10-CM | POA: Diagnosis not present

## 2021-03-19 DIAGNOSIS — M869 Osteomyelitis, unspecified: Secondary | ICD-10-CM | POA: Diagnosis not present

## 2021-03-19 DIAGNOSIS — M86172 Other acute osteomyelitis, left ankle and foot: Secondary | ICD-10-CM

## 2021-03-19 DIAGNOSIS — E11621 Type 2 diabetes mellitus with foot ulcer: Secondary | ICD-10-CM | POA: Diagnosis not present

## 2021-03-19 HISTORY — PX: WOUND DEBRIDEMENT: SHX247

## 2021-03-19 HISTORY — PX: APPLICATION OF WOUND VAC: SHX5189

## 2021-03-19 LAB — CBC
HCT: 35.8 % — ABNORMAL LOW (ref 39.0–52.0)
Hemoglobin: 11.5 g/dL — ABNORMAL LOW (ref 13.0–17.0)
MCH: 30.3 pg (ref 26.0–34.0)
MCHC: 32.1 g/dL (ref 30.0–36.0)
MCV: 94.2 fL (ref 80.0–100.0)
Platelets: 253 10*3/uL (ref 150–400)
RBC: 3.8 MIL/uL — ABNORMAL LOW (ref 4.22–5.81)
RDW: 12.8 % (ref 11.5–15.5)
WBC: 9 10*3/uL (ref 4.0–10.5)
nRBC: 0 % (ref 0.0–0.2)

## 2021-03-19 LAB — GLUCOSE, CAPILLARY
Glucose-Capillary: 196 mg/dL — ABNORMAL HIGH (ref 70–99)
Glucose-Capillary: 290 mg/dL — ABNORMAL HIGH (ref 70–99)
Glucose-Capillary: 221 mg/dL — ABNORMAL HIGH (ref 70–99)
Glucose-Capillary: 194 mg/dL — ABNORMAL HIGH (ref 70–99)
Glucose-Capillary: 183 mg/dL — ABNORMAL HIGH (ref 70–99)
Glucose-Capillary: 210 mg/dL — ABNORMAL HIGH (ref 70–99)

## 2021-03-19 LAB — CULTURE, BLOOD (ROUTINE X 2)
Culture: NO GROWTH
Culture: NO GROWTH
Special Requests: ADEQUATE
Special Requests: ADEQUATE

## 2021-03-19 LAB — BASIC METABOLIC PANEL
Anion gap: 7 (ref 5–15)
BUN: 26 mg/dL — ABNORMAL HIGH (ref 8–23)
CO2: 24 mmol/L (ref 22–32)
Calcium: 8.7 mg/dL — ABNORMAL LOW (ref 8.9–10.3)
Chloride: 105 mmol/L (ref 98–111)
Creatinine, Ser: 0.98 mg/dL (ref 0.61–1.24)
GFR, Estimated: >60 mL/min (ref >60)
Glucose, Bld: 202 mg/dL — ABNORMAL HIGH (ref 70–99)
Potassium: 4.6 mmol/L (ref 3.5–5.1)
Sodium: 136 mmol/L (ref 135–145)

## 2021-03-19 SURGERY — DEBRIDEMENT, WOUND
Anesthesia: Monitor Anesthesia Care | Site: Foot | Laterality: Left

## 2021-03-19 MED ORDER — ONDANSETRON HCL 4 MG/2ML IJ SOLN
INTRAMUSCULAR | Status: DC | PRN
  Administered 2021-03-19: 4 mg via INTRAVENOUS

## 2021-03-19 MED ORDER — BUPIVACAINE HCL (PF) 0.5 % IJ SOLN
INTRAMUSCULAR | Status: DC | PRN
  Administered 2021-03-19: 15 mL

## 2021-03-19 MED ORDER — LACTATED RINGERS IV SOLN: INTRAVENOUS | Status: DC

## 2021-03-19 MED ORDER — PROPOFOL 500 MG/50ML IV EMUL
INTRAVENOUS | Status: DC | PRN
  Administered 2021-03-19: 70 ug/kg/min via INTRAVENOUS

## 2021-03-19 MED ORDER — SODIUM CHLORIDE 0.9 % IR SOLN
Status: DC | PRN
  Administered 2021-03-19: 3000 mL

## 2021-03-19 MED ORDER — MIDAZOLAM HCL 2 MG/2ML IJ SOLN
INTRAMUSCULAR | Status: AC
  Filled 2021-03-19: qty 2

## 2021-03-19 MED ORDER — MIDAZOLAM HCL 5 MG/5ML IJ SOLN
INTRAMUSCULAR | Status: DC | PRN
  Administered 2021-03-19: 2 mg via INTRAVENOUS

## 2021-03-19 MED ORDER — BUPIVACAINE HCL (PF) 0.5 % IJ SOLN
INTRAMUSCULAR | Status: AC
  Filled 2021-03-19: qty 30

## 2021-03-19 SURGICAL SUPPLY — 66 items
BAG COUNTER SPONGE SURGICOUNT (BAG) IMPLANT
BLADE HEX COATED 2.75 (ELECTRODE) ×3 IMPLANT
BLADE OSCILLATING/SAGITTAL (BLADE)
BLADE SURG 15 STRL LF DISP TIS (BLADE) ×2 IMPLANT
BLADE SURG 15 STRL SS (BLADE) ×3
BLADE SW THK.38XMED LNG THN (BLADE) IMPLANT
BNDG ELASTIC 3X5.8 VLCR STR LF (GAUZE/BANDAGES/DRESSINGS) ×3 IMPLANT
BNDG ELASTIC 4X5.8 VLCR STR LF (GAUZE/BANDAGES/DRESSINGS) ×3 IMPLANT
BNDG ESMARK 4X9 LF (GAUZE/BANDAGES/DRESSINGS) ×3 IMPLANT
BNDG GAUZE ELAST 4 BULKY (GAUZE/BANDAGES/DRESSINGS) ×3 IMPLANT
CHLORAPREP W/TINT 26 (MISCELLANEOUS) ×3 IMPLANT
COVER BACK TABLE 60X90IN (DRAPES) ×3 IMPLANT
CUFF TOURN SGL QUICK 18X4 (TOURNIQUET CUFF) IMPLANT
DRAPE EXTREMITY T 121X128X90 (DISPOSABLE) ×3 IMPLANT
DRAPE IMP U-DRAPE 54X76 (DRAPES) ×3 IMPLANT
DRAPE U-SHAPE 47X51 STRL (DRAPES) ×3 IMPLANT
DRSG EMULSION OIL 3X3 NADH (GAUZE/BANDAGES/DRESSINGS) ×3 IMPLANT
DRSG PAD ABDOMINAL 8X10 ST (GAUZE/BANDAGES/DRESSINGS) IMPLANT
ELECT REM PT RETURN 15FT ADLT (MISCELLANEOUS) ×3 IMPLANT
GAUZE 4X4 16PLY ~~LOC~~+RFID DBL (SPONGE) IMPLANT
GAUZE SPONGE 4X4 12PLY STRL (GAUZE/BANDAGES/DRESSINGS) ×3 IMPLANT
GAUZE XEROFORM 1X8 LF (GAUZE/BANDAGES/DRESSINGS) IMPLANT
GAUZE XEROFORM 5X9 LF (GAUZE/BANDAGES/DRESSINGS) IMPLANT
GLOVE SRG 8 PF TXTR STRL LF DI (GLOVE) ×2 IMPLANT
GLOVE SURG ENC MOIS LTX SZ7.5 (GLOVE) ×3 IMPLANT
GLOVE SURG NEOPR MICRO LF SZ8 (GLOVE) ×3 IMPLANT
GLOVE SURG UNDER POLY LF SZ8 (GLOVE) ×3
GOWN STRL REUS W/ TWL LRG LVL3 (GOWN DISPOSABLE) ×2 IMPLANT
GOWN STRL REUS W/TWL LRG LVL3 (GOWN DISPOSABLE) ×3
GOWN STRL REUS W/TWL XL LVL3 (GOWN DISPOSABLE) ×6 IMPLANT
HANDPIECE INTERPULSE COAX TIP (DISPOSABLE) ×3
KIT BASIN OR (CUSTOM PROCEDURE TRAY) ×3 IMPLANT
KIT STIMULAN RAPID CURE  10CC (Orthopedic Implant) ×3 IMPLANT
KIT STIMULAN RAPID CURE 10CC (Orthopedic Implant) IMPLANT
MANIFOLD NEPTUNE II (INSTRUMENTS) ×3 IMPLANT
MATRIX PURAPLY MZ 1000MG (Tissue) IMPLANT
NDL HYPO 25X1 1.5 SAFETY (NEEDLE) ×2 IMPLANT
NEEDLE HYPO 25X1 1.5 SAFETY (NEEDLE) ×3 IMPLANT
NS IRRIG 1000ML POUR BTL (IV SOLUTION) IMPLANT
PACK ORTHO EXTREMITY (CUSTOM PROCEDURE TRAY) ×3 IMPLANT
PADDING CAST ABS 4INX4YD NS (CAST SUPPLIES) ×1
PADDING CAST ABS COTTON 4X4 ST (CAST SUPPLIES) ×2 IMPLANT
PENCIL SMOKE EVACUATOR (MISCELLANEOUS) IMPLANT
PURAPLY MZ 1000MG (Tissue) ×3 IMPLANT
SET HNDPC FAN SPRY TIP SCT (DISPOSABLE) IMPLANT
SET IRRIG Y TYPE TUR BLADDER L (SET/KITS/TRAYS/PACK) IMPLANT
SLEEVE SCD COMPRESS KNEE MED (STOCKING) ×3 IMPLANT
SPLINT FIBERGLASS 5X30 (CAST SUPPLIES) ×1 IMPLANT
SPONGE SURGIFOAM ABS GEL 100 (HEMOSTASIS) IMPLANT
STAPLER VISISTAT 35W (STAPLE) ×3 IMPLANT
STOCKINETTE 8 INCH (MISCELLANEOUS) ×6 IMPLANT
SUT ETHILON 2 0 PS N (SUTURE) ×1 IMPLANT
SUT ETHILON 3 0 PS 1 (SUTURE) IMPLANT
SUT ETHILON 4 0 PS 2 18 (SUTURE) IMPLANT
SUT MNCRL AB 3-0 PS2 18 (SUTURE) IMPLANT
SUT MNCRL AB 4-0 PS2 18 (SUTURE) IMPLANT
SUT MON AB 5-0 PS2 18 (SUTURE) IMPLANT
SUT VIC AB 2-0 SH 27 (SUTURE) ×3
SUT VIC AB 2-0 SH 27X BRD (SUTURE) IMPLANT
SUT VIC AB 3-0 FS2 27 (SUTURE) ×3 IMPLANT
SUT VIC AB 4-0 PS2 18 (SUTURE) ×3 IMPLANT
SYR BULB EAR ULCER 3OZ GRN STR (SYRINGE) ×3 IMPLANT
SYR CONTROL 10ML LL (SYRINGE) ×3 IMPLANT
TOWEL OR 17X26 10 PK STRL BLUE (TOWEL DISPOSABLE) ×3 IMPLANT
UNDERPAD 30X36 HEAVY ABSORB (UNDERPADS AND DIAPERS) ×3 IMPLANT
YANKAUER SUCT BULB TIP NO VENT (SUCTIONS) ×3 IMPLANT

## 2021-03-19 NOTE — Progress Notes (Signed)
PROGRESS NOTE    [name]  CZY:[id number] DOB: [birth year] DOA: 03/14/2021 PCP: [name], MD   Brief Narrative:  65 y.o. male with medical history significant of DM2, HLD, HTN. Presenting with left foot ulcer. This ulcer has shown up in the last week or two. It's in the medial side of the left foot. He was referred by his PCP to wound care. He was started on santyl dressings and doxycycline. The wound did not seem to improve. So he was seen by podiatry. They recommended getting an MRI and vascular studies. That was scheduled to happen this week.  Patient presented to the emergency department due to onset of fever.  He was hospitalized for further management.    Assessment & Plan:  Diabetic foot ulcer involving left foot secondary to osteomyelitis, POA Presumed staph epi pending cultures -Podiatry following, appreciate insight recommendations -Osteo-with dislocated navicular cuneiform and calcaneocuboid joints -Status postdebridement 03/16/2021 -03/19/2021 -tentatively planned navicular resection and wound vac placement with antibiotic beads -Pain currently well controlled -Continue vancomycin, cefepime, Flagyl pending cultures we will de-escalate appropriately  Normocytic anemia -Likely hemodilution all, follow repeat labs no signs or symptoms of bleeding at this point, bandage clean dry intact  Acute kidney injury -In the setting of poor p.o. intake given above, creatinine now resolving back to baseline, continue to follow with morning labs    Diabetes mellitus type 2, uncontrolled with hyperglycemia - HbA1c 8.2  - He uses Lantus insulin at home along with Jardiance and metformin.   -Continue sliding scale insulin, given multiple days perioperatively with poor p.o. intake we will hold off on adjusting long-acting insulin until more consistent diet and p.o. intake   Essential hypertension -Continue metoprolol only, hold other home medications  Hyperlipidemia Was on statin at  home.   DVT prophylaxis: Lovenox Code Status: Full Family Communication: None present  Status is: Inpatient  Dispo: The patient is from: Home              Anticipated d/c is to: To be determined              Anticipated d/c date is: 48 to 72 hours              Patient currently not medically stable for discharge  Consultants:  Podiatry  Procedures:  Debridement 03/16/2021 Tentative repeat debridement 03/19/2021  Antimicrobials:  Vancomycin, cefepime, Flagyl  Subjective: No acute issues or events overnight, pain currently well controlled denies nausea vomiting diarrhea constipation headache fevers chills or chest pain.  Somewhat anxious for surgery today but understands given his infection burden he may require multiple procedures in the near future.  Objective: Vitals:   03/18/21 0916 03/18/21 2154 03/19/21 0633 03/19/21 1345  BP: 133/82 121/74 134/78 134/79  Pulse: 86 76 73 75  Resp: 18 16 18    Temp: 98.2 F (36.8 C) 98.4 F (36.9 C) 98.4 F (36.9 C) 98.3 F (36.8 C)  TempSrc: Oral Oral Oral Oral  SpO2: 98% 99% 97% 97%  Weight:      Height:        Intake/Output Summary (Last 24 hours) at 03/19/2021 1408 Last data filed at 03/19/2021 0812 Gross per 24 hour  Intake 1340 ml  Output 2575 ml  Net -1235 ml   Filed Weights   03/14/21 0039  Weight: 84.7 kg    Examination:  General exam: Appears calm and comfortable  Respiratory system: Clear to auscultation. Respiratory effort normal. Cardiovascular system: S1 & S2 heard, RRR. No  JVD, murmurs, rubs, gallops or clicks. No pedal edema. Gastrointestinal system: Abdomen is nondistended, soft and nontender. No organomegaly or masses felt. Normal bowel sounds heard. Central nervous system: Alert and oriented. No focal neurological deficits. Extremities: Symmetric 5 x 5 power. Skin: No rashes, lesions or ulcers Psychiatry: Judgement and insight appear normal. Mood & affect appropriate.   Data Reviewed: I have  personally reviewed following labs and imaging studies  CBC: Recent Labs  Lab 03/14/21 0040 03/15/21 0332 03/16/21 0332 03/17/21 0326 03/18/21 0325 03/19/21 0327  WBC 11.9* 10.1 11.1* 8.5 7.4 9.0  NEUTROABS 9.1*  --   --   --   --   --   HGB 11.8* 10.6* 12.6* 11.2* 11.4* 11.5*  HCT 35.9* 32.7* 39.2 34.5* 35.3* 35.8*  MCV 93.7 95.3 96.3 94.8 94.4 94.2  PLT 263 209 253 245 252 253   Basic Metabolic Panel: Recent Labs  Lab 03/15/21 0332 03/16/21 0332 03/17/21 0326 03/18/21 0325 03/19/21 0327  NA 135 138 134* 135 136  K 4.1 4.8 4.3 4.6 4.6  CL 107 104 103 104 105  CO2 23 25 23 26 24   GLUCOSE 177* 237* 116* 200* 202*  BUN 22 22 22 20  26*  CREATININE 0.90 0.79 0.93 0.92 0.98  CALCIUM 8.1* 9.0 8.6* 8.8* 8.7*   GFR: Estimated Creatinine Clearance: 89.7 mL/min (by C-G formula based on SCr of 0.98 mg/dL). Liver Function Tests: Recent Labs  Lab 03/15/21 0332  AST 12*  ALT 15  ALKPHOS 60  BILITOT 0.7  PROT 5.7*  ALBUMIN 2.7*   No results for input(s): LIPASE, AMYLASE in the last 168 hours. No results for input(s): AMMONIA in the last 168 hours. Coagulation Profile: No results for input(s): INR, PROTIME in the last 168 hours. Cardiac Enzymes: No results for input(s): CKTOTAL, CKMB, CKMBINDEX, TROPONINI in the last 168 hours. BNP (last 3 results) No results for input(s): PROBNP in the last 8760 hours. HbA1C: No results for input(s): HGBA1C in the last 72 hours.  CBG: Recent Labs  Lab 03/18/21 1254 03/18/21 1702 03/18/21 2156 03/19/21 0811 03/19/21 1202  GLUCAP 240* 259* 256* 196* 290*   Lipid Profile: No results for input(s): CHOL, HDL, LDLCALC, TRIG, CHOLHDL, LDLDIRECT in the last 72 hours. Thyroid Function Tests: No results for input(s): TSH, T4TOTAL, FREET4, T3FREE, THYROIDAB in the last 72 hours. Anemia Panel: No results for input(s): VITAMINB12, FOLATE, FERRITIN, TIBC, IRON, RETICCTPCT in the last 72 hours. Sepsis Labs: No results for input(s):  PROCALCITON, LATICACIDVEN in the last 168 hours.  Recent Results (from the past 240 hour(s))  Blood culture (routine x 2)     Status: None   Collection Time: 03/14/21 12:40 AM   Specimen: BLOOD  Result Value Ref Range Status   Specimen Description   Final    BLOOD RIGHT ANTECUBITAL Performed at [facility], [address] [place], [address] [place]    Special Requests   Final    BOTTLES DRAWN AEROBIC AND ANAEROBIC Blood Culture adequate volume Performed at [facility], [address], [place], [address] [place]    Culture   Final    NO GROWTH 5 DAYS Performed at [facility], [address], [place], [address] [place]    Report Status 03/19/2021 FINAL  Final  Blood culture (routine x 2)     Status: None   Collection Time: 03/14/21  1:10 AM   Specimen: BLOOD LEFT FOREARM  Result Value Ref Range Status   Specimen Description  Final    BLOOD LEFT FOREARM Performed at [facility], [address] [place], [place] [address]    Special Requests   Final    BOTTLES DRAWN AEROBIC AND ANAEROBIC Blood Culture adequate volume Performed at [facility], [address] [place], [place] [address]    Culture   Final    NO GROWTH 5 DAYS Performed at [facility], [address] [place], [place] [address]    Report Status 03/19/2021 FINAL  Final  Resp Panel by RT-PCR (Flu A&B, Covid) Nasopharyngeal Swab     Status: None   Collection Time: 03/14/21  1:10 AM   Specimen: Nasopharyngeal Swab; Nasopharyngeal(NP) swabs in vial transport medium  Result Value Ref Range Status   SARS Coronavirus 2 by RT PCR NEGATIVE NEGATIVE Final    Comment: (NOTE) SARS-CoV-2 target nucleic acids are NOT DETECTED.  The SARS-CoV-2 RNA is generally detectable in upper respiratory specimens during the acute phase of infection. The lowest concentration of SARS-CoV-2 viral copies this assay can detect is 138 copies/mL. A negative  result does not preclude SARS-Cov-2 infection and should not be used as the sole basis for treatment or other patient management decisions. A negative result may occur with  improper specimen collection/handling, submission of specimen other than nasopharyngeal swab, presence of viral mutation(s) within the areas targeted by this assay, and inadequate number of viral copies(<138 copies/mL). A negative result must be combined with clinical observations, patient history, and epidemiological information. The expected result is Negative.  Fact Sheet for Patients:  BloggerCourse.com  Fact Sheet for Healthcare Providers:  SeriousBroker.it  This test is no t yet approved or cleared by the [place] FDA and  has been authorized for detection and/or diagnosis of SARS-CoV-2 by FDA under an Emergency Use Authorization (EUA). This EUA will remain  in effect (meaning this test can be used) for the duration of the COVID-19 declaration under Section 564(b)(1) of the Act, 21 U.S.C.section 360bbb-3(b)(1), unless the authorization is terminated  or revoked sooner.       Influenza A by PCR NEGATIVE NEGATIVE Final   Influenza B by PCR NEGATIVE NEGATIVE Final    Comment: (NOTE) The Xpert Xpress SARS-CoV-2/FLU/RSV plus assay is intended as an aid in the diagnosis of influenza from Nasopharyngeal swab specimens and should not be used as a sole basis for treatment. Nasal washings and aspirates are unacceptable for Xpert Xpress SARS-CoV-2/FLU/RSV testing.  Fact Sheet for Patients: BloggerCourse.com  Fact Sheet for Healthcare Providers: SeriousBroker.it  This test is not yet approved or cleared by the [place] FDA and has been authorized for detection and/or diagnosis of SARS-CoV-2 by FDA under an Emergency Use Authorization (EUA). This EUA will remain in effect (meaning this test can be used)  for the duration of the COVID-19 declaration under Section 564(b)(1) of the Act, 21 U.S.C. section 360bbb-3(b)(1), unless the authorization is terminated or revoked.  Performed at [facility], [address] [place], [place] [address]   MRSA Next Gen by PCR, Nasal     Status: None   Collection Time: 03/15/21  4:30 AM   Specimen: Nasal Mucosa; Nasal Swab  Result Value Ref Range Status   MRSA by PCR Next Gen NOT DETECTED NOT DETECTED Final    Comment: (NOTE) The GeneXpert MRSA Assay (FDA approved for NASAL specimens only), is one component of a comprehensive MRSA colonization surveillance program. It is not intended to diagnose MRSA infection nor to guide or monitor treatment for  MRSA infections. Test performance is not FDA approved in patients less than 69 years old. Performed at [facility], [address] [place], [place] [address]   Aerobic/Anaerobic Culture w Gram Stain (surgical/deep wound)     Status: None (Preliminary result)   Collection Time: 03/16/21 10:53 AM   Specimen: PATH Soft tissue resection  Result Value Ref Range Status   Specimen Description   Final    TISSUE LEFT FOOT Performed at [facility], [address], [place], [place] [address]    Special Requests   Final    NONE Performed at [facility], [address], [place], [place] [address]    Gram Stain   Final    FEW WBC PRESENT,BOTH PMN AND MONONUCLEAR NO ORGANISMS SEEN    Culture   Final    RARE STAPHYLOCOCCUS EPIDERMIDIS SUSCEPTIBILITIES TO FOLLOW CRITICAL VALUE NOTED.  VALUE IS CONSISTENT WITH PREVIOUSLY REPORTED AND CALLED VALUE. Performed at [facility], [address], [place], [place] [address]    Report Status PENDING  Incomplete  Aerobic/Anaerobic Culture w Gram Stain (surgical/deep wound)     Status: None (Preliminary result)   Collection Time: 03/16/21 11:12 AM   Specimen: PATH Soft tissue resection   Result Value Ref Range Status   Specimen Description   Final    FOOT LEFT BONE Performed at [facility], [address], [place], [place] [address]    Special Requests   Final    NONE Performed at [facility], [address], [place], [place] [address]    Gram Stain   Final    WBC PRESENT, PREDOMINANTLY MONONUCLEAR NO ORGANISMS SEEN    Culture   Final    FEW STAPHYLOCOCCUS EPIDERMIDIS NO ANAEROBES ISOLATED; CULTURE IN PROGRESS FOR 5 DAYS CRITICAL RESULT CALLED TO, READ BACK BY AND VERIFIED WITH: DR PRICE [phone] FCP Performed at [facility], [address], [place], [place] [address]    Report Status PENDING  Incomplete   Organism ID, Bacteria STAPHYLOCOCCUS EPIDERMIDIS  Final      Susceptibility   Staphylococcus epidermidis - MIC*    CIPROFLOXACIN <=0.5 SENSITIVE Sensitive     ERYTHROMYCIN >=8 RESISTANT Resistant     GENTAMICIN <=0.5 SENSITIVE Sensitive     OXACILLIN >=4 RESISTANT Resistant     TETRACYCLINE >=16 RESISTANT Resistant     VANCOMYCIN 2 SENSITIVE Sensitive     TRIMETH/SULFA 160 RESISTANT Resistant     CLINDAMYCIN >=8 RESISTANT Resistant     RIFAMPIN <=0.5 SENSITIVE Sensitive     Inducible Clindamycin NEGATIVE Sensitive     * FEW STAPHYLOCOCCUS EPIDERMIDIS   Radiology Studies: No results found.  Scheduled Meds:  atorvastatin  80 mg Oral Daily   enoxaparin (LOVENOX) injection  40 mg Subcutaneous Q24H   insulin aspart  0-15 Units Subcutaneous TID WC   insulin aspart  0-5 Units Subcutaneous QHS   insulin glargine-yfgn  8 Units Subcutaneous Daily   metoprolol succinate  50 mg Oral Daily   Continuous Infusions:  sodium chloride Stopped (03/16/21 0806)   ceFEPime (MAXIPIME) IV 2 g (03/19/21 1256)   metronidazole 500 mg (03/19/21 1141)   vancomycin 1,500 mg (03/19/21 0143)     LOS: 5 days    Time spent:   Azucena Fallen, DO [facility]  If 7PM-7AM, please contact  night-coverage [email or web address]  03/19/2021, 2:08 PM

## 2021-03-19 NOTE — Op Note (Signed)
  Patient Name: [name] DOB: July 12, 1955  MRN: [id number]   Date of Surgery: 03/19/21  Surgeon: Dr. Hardie Pulley, DPM Assistants: none  Pre-operative Diagnosis:  Osteomyelitis of left foot, ulcer left foot with necrosis of bone Post-operative Diagnosis:  same Procedures:  1) Debridement of left foot wound  2) Navicular bone excision for ostoemyelitis  3) Insertion of abx delivery device  4) Application of skin graft substitute  5) VAC application left foot Pathology/Specimens: * No specimens in log * Anesthesia: MAC/local Hemostasis: * No tourniquets in log * Estimated Blood Loss: 5 mL Materials:  Implant Name Type Inv. Item Serial No. Manufacturer Lot No. LRB No. Used Action  KIT STIMULAN RAPID CURE  10CC - [id number] Orthopedic Implant KIT STIMULAN RAPID CURE  10CC  [employer] [id number] Left 1 Implanted  PURAPLY MZ 1000MG    [employer] [id number] Left 1 Implanted   Medications: 1g vancomycin powder, 5m marcaine 09.7%plain Complications: none  Indications for Procedure:  This is a 65y.o. male with a chronic wound and ostoemyelitis to the left foot. He presents for planned RTOR for debridement and possible navicular resection. Risks, benefits, and alternatives of surgery were discussed. No guarantees given.   Procedure in Detail: Patient was identified in pre-operative holding area. Formal consent was signed and the left lower extremity was marked. Patient was brought back to the operating room. Anesthesia was induced. The extremity was prepped and draped in the usual sterile fashion. Timeout was taken to confirm patient name, laterality, and procedure prior to incision.   Attention was then directed to the left midfoot. There was a wound that measured 4x4 cm. The navicular bone was again exposed. It was mostly gray in appearance and soft. The bone was deemed non-viable. There was purulence around the bone and necrotic tissue. The remainder of the bone  was therefore excised. This additionally allowed the midfoot to reduce back into position. The surrounding soft tissue was sharply excisionally debrided with a rongeur to viable bleeding tissue.  The wound was then copiously irrigated with 3L of NS via pulse lavage. Stimulan beads fashioned with vancomycin and puraply MZ powder were packed into the wound. Deep suturing was performed with 2-0 vicryl to decrease dead space. The remainder of the PT tendon was sutured together to help prevent recurrent dislocation.   A wound VAC was applied to the wound - black foam to wound base, followed by transparent dressing. The dressing was pierced and trackpad was placed and reinforced. Set to 125 mmHg continuous with good seal. Ample padding was placed over this with care to pad the tubing so that it would not cause pressure to the skin. A 3-sided short leg splint was applied splinting the foot in reduced position.  Disposition: Following a period of post-operative monitoring, patient will be transferred to the floor.

## 2021-03-19 NOTE — Telephone Encounter (Signed)
Called and spoke to wife for post-op update. All questions answered

## 2021-03-19 NOTE — Anesthesia Preprocedure Evaluation (Signed)
Anesthesia Evaluation  Patient identified by MRN, date of birth, ID band Patient awake    Reviewed: Allergy & Precautions, NPO status , Patient's Chart, lab work & pertinent test results  Airway Mallampati: II  TM Distance: >3 FB Neck ROM: Full    Dental no notable dental hx.    Pulmonary neg pulmonary ROS,    Pulmonary exam normal breath sounds clear to auscultation       Cardiovascular hypertension, Pt. on medications and Pt. on home beta blockers + CAD, + Past MI and + CABG  Normal cardiovascular exam Rhythm:Regular Rate:Normal     Neuro/Psych negative neurological ROS  negative psych ROS   GI/Hepatic Neg liver ROS, GERD  ,  Endo/Other  diabetes  Renal/GU negative Renal ROS  negative genitourinary   Musculoskeletal negative musculoskeletal ROS (+)   Abdominal   Peds negative pediatric ROS (+)  Hematology negative hematology ROS (+)   Anesthesia Other Findings   Reproductive/Obstetrics negative OB ROS                             Anesthesia Physical Anesthesia Plan  ASA: 3  Anesthesia Plan: MAC   Post-op Pain Management:    Induction: Intravenous  PONV Risk Score and Plan: 1 and Propofol infusion and Treatment may vary due to age or medical condition  Airway Management Planned: Simple Face Mask  Additional Equipment:   Intra-op Plan:   Post-operative Plan:   Informed Consent: I have reviewed the patients History and Physical, chart, labs and discussed the procedure including the risks, benefits and alternatives for the proposed anesthesia with the patient or authorized representative who has indicated his/her understanding and acceptance.     Dental advisory given  Plan Discussed with: CRNA and Surgeon  Anesthesia Plan Comments:         Anesthesia Quick Evaluation

## 2021-03-19 NOTE — Brief Op Note (Signed)
03/19/2021  7:21 PM  PATIENT:  [name]  65 y.o. male  PRE-OPERATIVE DIAGNOSIS:  Osteomyelitis, ulceration  POST-OPERATIVE DIAGNOSIS:  Osteomyelitis, ulceration  PROCEDURE:  Procedure(s): DEBRIDEMENT WOUND (Left) APPLICATION OF WOUND VAC (Left)  SURGEON:  Surgeon(s) and Role:    * Evelina Bucy, DPM - Primary  PHYSICIAN ASSISTANT:   ASSISTANTS: none   ANESTHESIA:   local and MAC  EBL:  5 mL   BLOOD ADMINISTERED:none  DRAINS: Wound VAC   LOCAL MEDICATIONS USED:  MARCAINE    15 ml  SPECIMEN:  No Specimen  DISPOSITION OF SPECIMEN:  N/A  COUNTS:  YES  TOURNIQUET:  * No tourniquets in log *  DICTATION: .Dragon Dictation  PLAN OF CARE: transfer to floor  PATIENT DISPOSITION:  PACU - hemodynamically stable.   Delay start of Pharmacological VTE agent (>24hrs) due to surgical blood loss or risk of bleeding: not applicable

## 2021-03-19 NOTE — Progress Notes (Signed)
Inpatient Diabetes Program Recommendations  AACE/ADA: New Consensus Statement on Inpatient Glycemic Control (2015)  Target Ranges:  Prepandial:   less than 140 mg/dL      Peak postprandial:   less than 180 mg/dL (1-2 hours)      Critically ill patients:  140 - 180 mg/dL   Lab Results  Component Value Date   GLUCAP 196 (H) 03/19/2021   HGBA1C 8.2 (H) 03/15/2021     Review of Glycemic Control Results for [name], [name] "MILT" (MRN [id number]) as of 03/19/2021 10:06  Ref. Range 03/18/2021 07:52 03/18/2021 12:54 03/18/2021 17:02 03/18/2021 21:56 03/19/2021 08:11  Glucose-Capillary Latest Ref Range: 70 - 99 mg/dL 382 (H) 505 (H) 397 (H) 256 (H) 196 (H)    Diabetes history: DM 2 Outpatient Diabetes medications: Lantus 25 units bid, Metformin 850 mg bid, Januvia 100 mg Daily Current orders for Inpatient glycemic control:  Semglee 8 units Novolog 0-15 units tid + hs  A1c 8.2% on 10/3  Inpatient Diabetes Program Recommendations:    -  Increase Semglee to 15 units - May also benefit from Novolog 3 units tid meal coverage if eating >50% of meals.  Thanks,  Christena Deem RN, MSN, BC-ADM Inpatient Diabetes Coordinator Team Pager [phone] (8a-5p)

## 2021-03-19 NOTE — Interval H&P Note (Signed)
History and Physical Interval Note:  03/19/2021 6:07 PM  [name]  has presented today for surgery, with the diagnosis of Osteomyelitis, ulceration.  The various methods of treatment have been discussed with the patient and family. After consideration of risks, benefits and other options for treatment, the patient has consented to  Procedure(s): DEBRIDEMENT WOUND (Left) APPLICATION OF WOUND VAC (Left) as a surgical intervention.  The patient's history has been reviewed, patient examined, no change in status, stable for surgery.  I have reviewed the patient's chart and labs.  Questions were answered to the patient's satisfaction.     Park Liter

## 2021-03-19 NOTE — Transfer of Care (Signed)
Immediate Anesthesia Transfer of Care Note  Patient: [name]  Procedure(s) Performed: DEBRIDEMENT WOUND (Left: Foot) APPLICATION OF WOUND VAC (Left)  Patient Location: PACU  Anesthesia Type:MAC  Level of Consciousness: sedated, patient cooperative and responds to stimulation  Airway & Oxygen Therapy: Patient Spontanous Breathing and Patient connected to face mask oxygen  Post-op Assessment: Report given to RN and Post -op Vital signs reviewed and stable  Post vital signs: Reviewed and stable  Last Vitals:  Vitals Value Taken Time  BP    Temp    Pulse 69 03/19/21 1921  Resp 21 03/19/21 1921  SpO2 100 % 03/19/21 1921  Vitals shown include unvalidated device data.  Last Pain:  Vitals:   03/19/21 1554  TempSrc: Oral  PainSc:       Patients Stated Pain Goal: 3 (91/44/45 8483)  Complications: No notable events documented.

## 2021-03-20 DIAGNOSIS — E11621 Type 2 diabetes mellitus with foot ulcer: Secondary | ICD-10-CM | POA: Diagnosis not present

## 2021-03-20 DIAGNOSIS — M869 Osteomyelitis, unspecified: Secondary | ICD-10-CM | POA: Diagnosis not present

## 2021-03-20 DIAGNOSIS — L97529 Non-pressure chronic ulcer of other part of left foot with unspecified severity: Secondary | ICD-10-CM | POA: Diagnosis not present

## 2021-03-20 LAB — CBC
HCT: 34.8 % — ABNORMAL LOW (ref 39.0–52.0)
Hemoglobin: 11.6 g/dL — ABNORMAL LOW (ref 13.0–17.0)
MCH: 31.2 pg (ref 26.0–34.0)
MCHC: 33.3 g/dL (ref 30.0–36.0)
MCV: 93.5 fL (ref 80.0–100.0)
Platelets: 248 10*3/uL (ref 150–400)
RBC: 3.72 MIL/uL — ABNORMAL LOW (ref 4.22–5.81)
RDW: 12.9 % (ref 11.5–15.5)
WBC: 9.6 10*3/uL (ref 4.0–10.5)
nRBC: 0 % (ref 0.0–0.2)

## 2021-03-20 LAB — GLUCOSE, CAPILLARY
Glucose-Capillary: 229 mg/dL — ABNORMAL HIGH (ref 70–99)
Glucose-Capillary: 209 mg/dL — ABNORMAL HIGH (ref 70–99)
Glucose-Capillary: 293 mg/dL — ABNORMAL HIGH (ref 70–99)
Glucose-Capillary: 326 mg/dL — ABNORMAL HIGH (ref 70–99)

## 2021-03-20 LAB — BASIC METABOLIC PANEL
Anion gap: 8 (ref 5–15)
BUN: 19 mg/dL (ref 8–23)
CO2: 25 mmol/L (ref 22–32)
Calcium: 8.6 mg/dL — ABNORMAL LOW (ref 8.9–10.3)
Chloride: 100 mmol/L (ref 98–111)
Creatinine, Ser: 0.9 mg/dL (ref 0.61–1.24)
GFR, Estimated: >60 mL/min (ref >60)
Glucose, Bld: 274 mg/dL — ABNORMAL HIGH (ref 70–99)
Potassium: 4.5 mmol/L (ref 3.5–5.1)
Sodium: 133 mmol/L — ABNORMAL LOW (ref 135–145)

## 2021-03-20 LAB — VANCOMYCIN, TROUGH: Vancomycin Tr: 20 ug/mL (ref 15–20)

## 2021-03-20 LAB — VANCOMYCIN, PEAK: Vancomycin Pk: 44 ug/mL — ABNORMAL HIGH (ref 30–40)

## 2021-03-20 MED ORDER — VANCOMYCIN HCL IN DEXTROSE 1-5 GM/200ML-% IV SOLN
1000.0000 mg | Freq: Two times a day (BID) | INTRAVENOUS | Status: DC
  Administered 2021-03-20 – 2021-03-21 (×2): 1000 mg via INTRAVENOUS
  Filled 2021-03-20 (×3): qty 200

## 2021-03-20 NOTE — Plan of Care (Signed)

## 2021-03-20 NOTE — Progress Notes (Signed)
PROGRESS NOTE    [name]  KNL:[id number] DOB: 11/29/1955 DOA: 03/14/2021 PCP: [name], MD   Brief Narrative:  65 y.o. male with medical history significant of DM2, HLD, HTN. Presenting with left foot ulcer. This ulcer has shown up in the last week or two. It's in the medial side of the left foot. He was referred by his PCP to wound care. He was started on santyl dressings and doxycycline. The wound did not seem to improve. So he was seen by podiatry. They recommended getting an MRI and vascular studies. That was scheduled to happen this week.  Patient presented to the emergency department due to onset of fever.  He was hospitalized for further management.    Assessment & Plan:  Diabetic foot ulcer involving left foot secondary to osteomyelitis, POA Presumed staph epi pending cultures -Podiatry following, appreciate insight recommendations -Osteo-with dislocated navicular cuneiform and calcaneocuboid joints -Status postdebridement 03/16/2021 -03/19/2021 -tentatively planned navicular resection and wound vac placement with antibiotic beads -Pain currently well controlled -Continue vancomycin, cefepime, Flagyl pending cultures we will de-escalate appropriately  Normocytic anemia -Likely hemodilution all, follow repeat labs no signs or symptoms of bleeding at this point, bandage clean dry intact  Acute kidney injury, resolved -In the setting of poor p.o. intake -back to baseline   Diabetes mellitus type 2, uncontrolled with hyperglycemia - HbA1c 8.2  - He uses Lantus insulin at home along with Jardiance and metformin.   -Continue sliding scale insulin, given multiple days perioperatively with poor p.o. intake we will hold off on adjusting long-acting insulin until more consistent diet and p.o. intake   Essential hypertension -Continue metoprolol only, hold other home medications  Hyperlipidemia Was on statin at home.   DVT prophylaxis: Lovenox Code Status: Full Family  Communication: None present  Status is: Inpatient  Dispo: The patient is from: Home              Anticipated d/c is to: To be determined              Anticipated d/c date is: 48 to 72 hours              Patient currently not medically stable for discharge  Consultants:  Podiatry  Procedures:  Debridement 03/16/2021 Tentative repeat debridement 03/19/2021  Antimicrobials:  Vancomycin, cefepime, Flagyl  Subjective: No acute issues or events overnight, tolerated procedure yesterday quite well denies nausea vomiting diarrhea constipation headache fevers chills or chest pain.  Looking forward to physical therapy later today.  Objective: Vitals:   03/19/21 2221 03/20/21 0153 03/20/21 0155 03/20/21 0601  BP: (!) 146/81 (!) 172/57 (!) 162/85 (!) 151/76  Pulse: 77 79  87  Resp: 16 16  18   Temp: 98.6 F (37 C) 99 F (37.2 C)  99 F (37.2 C)  TempSrc: Oral Oral  Oral  SpO2: 100% 99%  99%  Weight:      Height:        Intake/Output Summary (Last 24 hours) at 03/20/2021 0759 Last data filed at 03/20/2021 0744 Gross per 24 hour  Intake 2593.42 ml  Output 1855 ml  Net 738.42 ml    Filed Weights   03/14/21 0039  Weight: 84.7 kg    Examination:  General exam: Appears calm and comfortable  Respiratory system: Clear to auscultation. Respiratory effort normal. Cardiovascular system: S1 & S2 heard, RRR. No JVD, murmurs, rubs, gallops or clicks. No pedal edema. Gastrointestinal system: Abdomen is nondistended, soft and nontender. No organomegaly or masses  felt. Normal bowel sounds heard. Central nervous system: Alert and oriented. No focal neurological deficits. Extremities: Symmetric 5 x 5 power, wound VAC clean dry intact without leak Skin: No rashes, lesions Psychiatry: Judgement and insight appear normal. Mood & affect appropriate.   Data Reviewed: I have personally reviewed following labs and imaging studies  CBC: Recent Labs  Lab 03/14/21 0040 03/15/21 0332  03/16/21 0332 03/17/21 0326 03/18/21 0325 03/19/21 0327 03/20/21 0453  WBC 11.9*   < > 11.1* 8.5 7.4 9.0 9.6  NEUTROABS 9.1*  --   --   --   --   --   --   HGB 11.8*   < > 12.6* 11.2* 11.4* 11.5* 11.6*  HCT 35.9*   < > 39.2 34.5* 35.3* 35.8* 34.8*  MCV 93.7   < > 96.3 94.8 94.4 94.2 93.5  PLT 263   < > 253 245 252 253 248   < > = values in this interval not displayed.    Basic Metabolic Panel: Recent Labs  Lab 03/16/21 0332 03/17/21 0326 03/18/21 0325 03/19/21 0327 03/20/21 0453  NA 138 134* 135 136 133*  K 4.8 4.3 4.6 4.6 4.5  CL 104 103 104 105 100  CO2 25 23 26 24 25   GLUCOSE 237* 116* 200* 202* 274*  BUN 22 22 20  26* 19  CREATININE 0.79 0.93 0.92 0.98 0.90  CALCIUM 9.0 8.6* 8.8* 8.7* 8.6*    GFR: Estimated Creatinine Clearance: 97.7 mL/min (by C-G formula based on SCr of 0.9 mg/dL). Liver Function Tests: Recent Labs  Lab 03/15/21 0332  AST 12*  ALT 15  ALKPHOS 60  BILITOT 0.7  PROT 5.7*  ALBUMIN 2.7*    No results for input(s): LIPASE, AMYLASE in the last 168 hours. No results for input(s): AMMONIA in the last 168 hours. Coagulation Profile: No results for input(s): INR, PROTIME in the last 168 hours. Cardiac Enzymes: No results for input(s): CKTOTAL, CKMB, CKMBINDEX, TROPONINI in the last 168 hours. BNP (last 3 results) No results for input(s): PROBNP in the last 8760 hours. HbA1C: No results for input(s): HGBA1C in the last 72 hours.  CBG: Recent Labs  Lab 03/19/21 1202 03/19/21 1550 03/19/21 1731 03/19/21 1929 03/19/21 2124  GLUCAP 290* 221* 194* 183* 210*    Lipid Profile: No results for input(s): CHOL, HDL, LDLCALC, TRIG, CHOLHDL, LDLDIRECT in the last 72 hours. Thyroid Function Tests: No results for input(s): TSH, T4TOTAL, FREET4, T3FREE, THYROIDAB in the last 72 hours. Anemia Panel: No results for input(s): VITAMINB12, FOLATE, FERRITIN, TIBC, IRON, RETICCTPCT in the last 72 hours. Sepsis Labs: No results for input(s): PROCALCITON,  LATICACIDVEN in the last 168 hours.  Recent Results (from the past 240 hour(s))  Blood culture (routine x 2)     Status: None   Collection Time: 03/14/21 12:40 AM   Specimen: BLOOD  Result Value Ref Range Status   Specimen Description   Final    BLOOD RIGHT ANTECUBITAL Performed at [facility], [address] [place], [place] [address]    Special Requests   Final    BOTTLES DRAWN AEROBIC AND ANAEROBIC Blood Culture adequate volume Performed at [facility], [address] [place], [place] [address]    Culture   Final    NO GROWTH 5 DAYS Performed at [facility], [address], [place], [place] [address]    Report Status 03/19/2021 FINAL  Final  Blood culture (routine x 2)     Status: None  Collection Time: 03/14/21  1:10 AM   Specimen: BLOOD LEFT FOREARM  Result Value Ref Range Status   Specimen Description   Final    BLOOD LEFT FOREARM Performed at [facility], [address] [place], [place] [address]    Special Requests   Final    BOTTLES DRAWN AEROBIC AND ANAEROBIC Blood Culture adequate volume Performed at [facility], [address], [place], [place] [address]    Culture   Final    NO GROWTH 5 DAYS Performed at [facility], [address] [place], [place] [address]    Report Status 03/19/2021 FINAL  Final  Resp Panel by RT-PCR (Flu A&B, Covid) Nasopharyngeal Swab     Status: None   Collection Time: 03/14/21  1:10 AM   Specimen: Nasopharyngeal Swab; Nasopharyngeal(NP) swabs in vial transport medium  Result Value Ref Range Status   SARS Coronavirus 2 by RT PCR NEGATIVE NEGATIVE Final    Comment: (NOTE) SARS-CoV-2 target nucleic acids are NOT DETECTED.  The SARS-CoV-2 RNA is generally detectable in upper respiratory specimens during the acute phase of infection. The lowest concentration of SARS-CoV-2 viral copies this assay can detect is 138 copies/mL. A negative result does not  preclude SARS-Cov-2 infection and should not be used as the sole basis for treatment or other patient management decisions. A negative result may occur with  improper specimen collection/handling, submission of specimen other than nasopharyngeal swab, presence of viral mutation(s) within the areas targeted by this assay, and inadequate number of viral copies(<138 copies/mL). A negative result must be combined with clinical observations, patient history, and epidemiological information. The expected result is Negative.  Fact Sheet for Patients:  BloggerCourse.com  Fact Sheet for Healthcare Providers:  SeriousBroker.it  This test is no t yet approved or cleared by the [place] FDA and  has been authorized for detection and/or diagnosis of SARS-CoV-2 by FDA under an Emergency Use Authorization (EUA). This EUA will remain  in effect (meaning this test can be used) for the duration of the COVID-19 declaration under Section 564(b)(1) of the Act, 21 U.S.C.section 360bbb-3(b)(1), unless the authorization is terminated  or revoked sooner.       Influenza A by PCR NEGATIVE NEGATIVE Final   Influenza B by PCR NEGATIVE NEGATIVE Final    Comment: (NOTE) The Xpert Xpress SARS-CoV-2/FLU/RSV plus assay is intended as an aid in the diagnosis of influenza from Nasopharyngeal swab specimens and should not be used as a sole basis for treatment. Nasal washings and aspirates are unacceptable for Xpert Xpress SARS-CoV-2/FLU/RSV testing.  Fact Sheet for Patients: BloggerCourse.com  Fact Sheet for Healthcare Providers: SeriousBroker.it  This test is not yet approved or cleared by the [place] FDA and has been authorized for detection and/or diagnosis of SARS-CoV-2 by FDA under an Emergency Use Authorization (EUA). This EUA will remain in effect (meaning this test can be used) for the  duration of the COVID-19 declaration under Section 564(b)(1) of the Act, 21 U.S.C. section 360bbb-3(b)(1), unless the authorization is terminated or revoked.  Performed at [facility], [address] [place], [place] [address]   MRSA Next Gen by PCR, Nasal     Status: None   Collection Time: 03/15/21  4:30 AM   Specimen: Nasal Mucosa; Nasal Swab  Result Value Ref Range Status   MRSA by PCR Next Gen NOT DETECTED NOT DETECTED Final    Comment: (NOTE) The GeneXpert MRSA Assay (FDA approved for NASAL specimens only), is  one component of a comprehensive MRSA colonization surveillance program. It is not intended to diagnose MRSA infection nor to guide or monitor treatment for MRSA infections. Test performance is not FDA approved in patients less than 60 years old. Performed at [facility], [address] [place], [place] [address]   Aerobic/Anaerobic Culture w Gram Stain (surgical/deep wound)     Status: None (Preliminary result)   Collection Time: 03/16/21 10:53 AM   Specimen: PATH Soft tissue resection  Result Value Ref Range Status   Specimen Description   Final    TISSUE LEFT FOOT Performed at [facility], [address], [place], [place] [address]    Special Requests   Final    NONE Performed at [facility], [address] [place], [place] [address]    Gram Stain   Final    FEW WBC PRESENT,BOTH PMN AND MONONUCLEAR NO ORGANISMS SEEN    Culture   Final    RARE STAPHYLOCOCCUS EPIDERMIDIS SUSCEPTIBILITIES TO FOLLOW CRITICAL VALUE NOTED.  VALUE IS CONSISTENT WITH PREVIOUSLY REPORTED AND CALLED VALUE. Performed at [facility], [address] [place], [place] [address]    Report Status PENDING  Incomplete  Aerobic/Anaerobic Culture w Gram Stain (surgical/deep wound)     Status: None (Preliminary result)   Collection Time: 03/16/21 11:12 AM   Specimen: PATH Soft tissue resection  Result  Value Ref Range Status   Specimen Description   Final    FOOT LEFT BONE Performed at [facility], [address] [place], [place] [address]    Special Requests   Final    NONE Performed at [facility], [address], [place], [place] [address]    Gram Stain   Final    WBC PRESENT, PREDOMINANTLY MONONUCLEAR NO ORGANISMS SEEN    Culture   Final    FEW STAPHYLOCOCCUS EPIDERMIDIS NO ANAEROBES ISOLATED; CULTURE IN PROGRESS FOR 5 DAYS CRITICAL RESULT CALLED TO, READ BACK BY AND VERIFIED WITH: DR PRICE [id number] FCP Performed at [facility], [address], [place], [place] [address]    Report Status PENDING  Incomplete   Organism ID, Bacteria STAPHYLOCOCCUS EPIDERMIDIS  Final      Susceptibility   Staphylococcus epidermidis - MIC*    CIPROFLOXACIN <=0.5 SENSITIVE Sensitive     ERYTHROMYCIN >=8 RESISTANT Resistant     GENTAMICIN <=0.5 SENSITIVE Sensitive     OXACILLIN >=4 RESISTANT Resistant     TETRACYCLINE >=16 RESISTANT Resistant     VANCOMYCIN 2 SENSITIVE Sensitive     TRIMETH/SULFA 160 RESISTANT Resistant     CLINDAMYCIN >=8 RESISTANT Resistant     RIFAMPIN <=0.5 SENSITIVE Sensitive     Inducible Clindamycin NEGATIVE Sensitive     * FEW STAPHYLOCOCCUS EPIDERMIDIS   Radiology Studies: No results found.  Scheduled Meds:  atorvastatin  80 mg Oral Daily   enoxaparin (LOVENOX) injection  40 mg Subcutaneous Q24H   insulin aspart  0-15 Units Subcutaneous TID WC   insulin aspart  0-5 Units Subcutaneous QHS   insulin glargine-yfgn  8 Units Subcutaneous Daily   metoprolol succinate  50 mg Oral Daily   Continuous Infusions:  sodium chloride 10 mL/hr at 03/20/21 0744   ceFEPime (MAXIPIME) IV 2 g (03/20/21 0408)   lactated ringers 50 mL/hr at 03/19/21 1608   metronidazole 500 mg (03/19/21 2334)   vancomycin Stopped (03/20/21 0359)     LOS: 6 days    Time  spent:   Azucena Fallen, DO [facility]  If  7PM-7AM, please contact night-coverage [email or web address]  03/20/2021, 7:59 AM

## 2021-03-20 NOTE — Progress Notes (Signed)
Pharmacy Antibiotic Note  [name] is a 65 y.o. male admitted on 03/14/2021 with left foot ulcer not improving on doxycycline. XR w/o osteo.  03/20/2021 D#7 vancomycin/cefepime/flagyl WBC 9.6, AF, SCr stable at 0.9 Vanc pk= 44, vanc tr= 20, vanc auc = 763  Plan: Change vancomycin to Vancomycin 1000 mg IV q12h  Goal AUC 400-550. Expected AUC: 508 Cefepime 2 g iv q 8 hours per MD Flagyl 500 mg iv q 12 hours per MD  Will f/u renal function, culture results, and clinical course Levels if/when indicated  Height: 6\' 2"  (188 cm) Weight: 84.7 kg (186 lb 11.2 oz) IBW/kg (Calculated) : 82.2  Temp (24hrs), Avg:98.3 F (36.8 C), Min:97.4 F (36.3 C), Max:99.1 F (37.3 C)  Recent Labs  Lab 03/16/21 0332 03/17/21 0326 03/18/21 0325 03/19/21 0327 03/20/21 0453 03/20/21 1333  WBC 11.1* 8.5 7.4 9.0 9.6  --   CREATININE 0.79 0.93 0.92 0.98 0.90  --   VANCOTROUGH  --   --   --   --   --  20  VANCOPEAK  --   --   --   --  44*  --      Estimated Creatinine Clearance: 97.7 mL/min (by C-G formula based on SCr of 0.9 mg/dL).    Allergies  Allergen Reactions   Codeine Sulfate [Codeine]     UNSPECIFIED REACTION    Antimicrobials this admission: 10/2 CTX x 1 10/2 vanc >>  10/2 Flagyl >> 10/2 cefepime >>   Dose adjustments this admission: 10/4 V 1750 q24>> 1500 q12 Microbiology results: 10/2 BCx: neg 10/3 MRSA neg 10/4 OR tissue: MRSE 104 OR bone MRSE  Thank you for allowing pharmacy to be a part of this patient's care.  12/2 D  03/20/2021 2:27 PM

## 2021-03-20 NOTE — Progress Notes (Signed)
Subjective:  Patient ID: [name], male    DOB: August 16, 1955,  MRN: [id number]  Patient seen bedside. Only had a little bit of pain, but well controlled. Objective:   Vitals:   03/20/21 0601 03/20/21 0959  BP: (!) 151/76 110/64  Pulse: 87 80  Resp: 18 18  Temp: 99 F (37.2 C) 99.1 F (37.3 C)  SpO2: 99% 96%   General AA&O x3. Normal mood and affect.  Vascular Dorsalis pedis and posterior tibial pulses 2/4 bilat. Brisk capillary refill to all digits. Pedal hair present.  Neurologic Epicritic sensation grossly absent.  Dermatologic Dressing c/d/I. No strikethrough. Minimal drainage in cannister.  Orthopedic: MMT 5/5 in dorsiflexion, plantarflexion, inversion, and eversion.    Results for orders placed or performed during the hospital encounter of 03/14/21 (from the past 24 hour(s))  Glucose, capillary     Status: Abnormal   Collection Time: 03/19/21 12:02 PM  Result Value Ref Range   Glucose-Capillary 290 (H) 70 - 99 mg/dL  Glucose, capillary     Status: Abnormal   Collection Time: 03/19/21  3:50 PM  Result Value Ref Range   Glucose-Capillary 221 (H) 70 - 99 mg/dL  Glucose, capillary     Status: Abnormal   Collection Time: 03/19/21  5:31 PM  Result Value Ref Range   Glucose-Capillary 194 (H) 70 - 99 mg/dL   Comment 1 Notify RN    Comment 2 Document in Chart    Comment 3 Call MD NNP PA CNM   Glucose, capillary     Status: Abnormal   Collection Time: 03/19/21  7:29 PM  Result Value Ref Range   Glucose-Capillary 183 (H) 70 - 99 mg/dL  Glucose, capillary     Status: Abnormal   Collection Time: 03/19/21  9:24 PM  Result Value Ref Range   Glucose-Capillary 210 (H) 70 - 99 mg/dL  CBC     Status: Abnormal   Collection Time: 03/20/21  4:53 AM  Result Value Ref Range   WBC 9.6 4.0 - 10.5 K/uL   RBC 3.72 (L) 4.22 - 5.81 MIL/uL   Hemoglobin 11.6 (L) 13.0 - 17.0 g/dL   HCT 01.6 (L) 01.0 - 93.2 %   MCV 93.5 80.0 - 100.0 fL   MCH 31.2 26.0 - 34.0 pg   MCHC 33.3 30.0 -  36.0 g/dL   RDW 35.5 73.2 - 20.2 %   Platelets 248 150 - 400 K/uL   nRBC 0.0 0.0 - 0.2 %  Basic metabolic panel     Status: Abnormal   Collection Time: 03/20/21  4:53 AM  Result Value Ref Range   Sodium 133 (L) 135 - 145 mmol/L   Potassium 4.5 3.5 - 5.1 mmol/L   Chloride 100 98 - 111 mmol/L   CO2 25 22 - 32 mmol/L   Glucose, Bld 274 (H) 70 - 99 mg/dL   BUN 19 8 - 23 mg/dL   Creatinine, Ser 5.42 0.61 - 1.24 mg/dL   Calcium 8.6 (L) 8.9 - 10.3 mg/dL   GFR, Estimated >70 >62 mL/min   Anion gap 8 5 - 15  Vancomycin, peak     Status: Abnormal   Collection Time: 03/20/21  4:53 AM  Result Value Ref Range   Vancomycin Pk 44 (H) 30 - 40 ug/mL  Glucose, capillary     Status: Abnormal   Collection Time: 03/20/21  9:17 AM  Result Value Ref Range   Glucose-Capillary 229 (H) 70 - 99 mg/dL  Glucose, capillary  Status: Abnormal   Collection Time: 03/20/21 11:10 AM  Result Value Ref Range   Glucose-Capillary 209 (H) 70 - 99 mg/dL   Assessment & Plan:  Patient was evaluated and treated and all questions answered.  Left foot ulceration, osteomyelitis in setting of acute Charcot -Labs and culture reviewed -Dressing intact today without strikethrough - left intact. Change VAC tomorrow. -Believed surgical cure of OM given full resection of infected bone. Plan for PO Abx at discharge x2 weeks - G+ coverage. -Filled out forms for home [facility]. Placed in chart -Order HHC for help with changing VAC -Order knee scooter for safe NWB at home -Will continue to follow  Park Liter, DPM  Accessible via secure chat for questions or concerns.

## 2021-03-20 NOTE — Progress Notes (Signed)
Pt and his family feel like going home on Tuesday would be better due to pt not having a ride home on Monday and they feel like Monday would be rushing the discharge planning process. Pt remains stable. RN will continue to monitor.

## 2021-03-20 NOTE — Plan of Care (Signed)
  Problem: Clinical Measurements: Goal: Cardiovascular complication will be avoided Outcome: Progressing   Problem: Clinical Measurements: Goal: Respiratory complications will improve Outcome: Progressing   Problem: Activity: Goal: Risk for activity intolerance will decrease Outcome: Progressing   Problem: Safety: Goal: Ability to remain free from injury will improve Outcome: Progressing

## 2021-03-21 DIAGNOSIS — E11621 Type 2 diabetes mellitus with foot ulcer: Secondary | ICD-10-CM | POA: Diagnosis not present

## 2021-03-21 DIAGNOSIS — L97529 Non-pressure chronic ulcer of other part of left foot with unspecified severity: Secondary | ICD-10-CM | POA: Diagnosis not present

## 2021-03-21 DIAGNOSIS — M869 Osteomyelitis, unspecified: Secondary | ICD-10-CM | POA: Diagnosis not present

## 2021-03-21 LAB — AEROBIC/ANAEROBIC CULTURE W GRAM STAIN (SURGICAL/DEEP WOUND)

## 2021-03-21 LAB — GLUCOSE, CAPILLARY
Glucose-Capillary: 247 mg/dL — ABNORMAL HIGH (ref 70–99)
Glucose-Capillary: 249 mg/dL — ABNORMAL HIGH (ref 70–99)
Glucose-Capillary: 319 mg/dL — ABNORMAL HIGH (ref 70–99)
Glucose-Capillary: 286 mg/dL — ABNORMAL HIGH (ref 70–99)

## 2021-03-21 LAB — CREATININE, SERUM
Creatinine, Ser: 0.95 mg/dL (ref 0.61–1.24)
GFR, Estimated: >60 mL/min

## 2021-03-21 MED ORDER — CEPHALEXIN 250 MG PO CAPS
250.0000 mg | ORAL_CAPSULE | Freq: Four times a day (QID) | ORAL | Status: DC
  Administered 2021-03-21 – 2021-03-22 (×5): 250 mg via ORAL
  Filled 2021-03-21 (×5): qty 1

## 2021-03-21 MED ORDER — OXYCODONE HCL 5 MG PO TABS: 5.0000 mg | ORAL_TABLET | Freq: Four times a day (QID) | ORAL | 0 refills | Status: AC | PRN

## 2021-03-21 MED ORDER — CEPHALEXIN 250 MG PO CAPS: 250.0000 mg | ORAL_CAPSULE | Freq: Four times a day (QID) | ORAL | 0 refills | Status: DC

## 2021-03-21 NOTE — Anesthesia Postprocedure Evaluation (Signed)
Anesthesia Post Note  Patient: [name]  Procedure(s) Performed: DEBRIDEMENT WOUND (Left: Foot) APPLICATION OF WOUND VAC (Left)     Patient location during evaluation: PACU Anesthesia Type: MAC Level of consciousness: awake and alert Pain management: pain level controlled Vital Signs Assessment: post-procedure vital signs reviewed and stable Respiratory status: spontaneous breathing, nonlabored ventilation, respiratory function stable and patient connected to nasal cannula oxygen Cardiovascular status: stable and blood pressure returned to baseline Postop Assessment: no apparent nausea or vomiting Anesthetic complications: no   No notable events documented.  Last Vitals:  Vitals:   03/20/21 2100 03/21/21 0649  BP: 130/75 131/73  Pulse: 80 72  Resp: 18 16  Temp: 36.9 C 36.8 C  SpO2: 99% 98%    Last Pain:  Vitals:   03/21/21 0723  TempSrc:   PainSc: 0-No pain                 Claus Silvestro S

## 2021-03-21 NOTE — Progress Notes (Signed)
Rn called Dr. Samuella Cota concerning wound vac for home. Dr. Samuella Cota clarified pt will need [employer] wound vac for home. Social work stated we cannot get one today for patient to d/c home.

## 2021-03-21 NOTE — TOC Progression Note (Addendum)
Transition of Care [facility]) - Progression Note    Patient Details  Name: [name] MRN: [id number] Date of Birth: 1955/11/12  Transition of Care [facility] CM/SW Contact  Darleene Cleaver, [place] Phone Number: 03/21/2021, 10:06 AM  Clinical Narrative:     CSW attempted to contact [employer] for wound vac for patient, there is no one available to deliver, weekday TOC will have to order [employer] wound vac tomorrow.  3:15pm  CSW spoke to patient's wife Andrey Campanile [phone], and explained to her that the [employer] wound vac can not be ordered today, and the weekday [facility] worker will follow up tomorrow.  CSW informed wife that [name] is able to accept patient for [facility] PT and RN.  CSW also explained to patient's wife, home health will not be there every day, only a couple times a week.    Patient's wife expressed understanding.  Patient's wife asked what time the wound vac will be delivered tomorrow, and CSW informed her it will determine when all paperwork is completed.  CSW also informed patient's wife that there may be a copay with their insurance, but the [facility] agency can clarify.  Expected Discharge Plan: Home w Home Health Services Barriers to Discharge: Continued Medical Work up  Expected Discharge Plan and Services Expected Discharge Plan: Home w Home Health Services   Discharge Planning Services: CM Consult Post Acute Care Choice: Durable Medical Equipment, Home Health Living arrangements for the past 2 months: Single Family Home Expected Discharge Date: 03/21/21               DME Arranged: Dan Humphreys rolling, Tub bench DME Agency: [employer] Date DME Agency Contacted: 03/17/21 Time DME Agency Contacted: [phone] Representative spoke with at DME Agency: [email or web address]             Social Determinants of Health (SDOH) Interventions    Readmission Risk Interventions No flowsheet data found.

## 2021-03-21 NOTE — Discharge Summary (Signed)
Physician Discharge Summary  [name] WUJ:[id number] DOB: 1955-10-26 DOA: 03/14/2021  PCP: [name], MD  Admit date: 03/14/2021 Discharge date: 03/22/2021  Admitted From: Home Disposition: Home  Recommendations for Outpatient Follow-up:  Follow up with PCP in 1-2 weeks Please obtain BMP/CBC in one week Please follow up with podiatry and wound care services for ongoing wound VAC management and postop care  Home Health: PT OT, wound care Equipment/Devices: Rolling walker tub bench, wound VAC  Discharge Condition: Stable CODE STATUS: Full Diet recommendation: Diabetic diet as tolerated  Brief/Interim Summary: 65 y.o. male with medical history significant of DM2, HLD, HTN. Presenting with left foot ulcer requiring multiple procedures with podiatry and Dr. Samuella Cota.  Status post debridement on 03/16/2021 as well as repeat debridement, navicular resection and wound VAC placement on 03/19/2021.  Patient tolerated these procedures quite well otherwise ambulating with some difficulty using a walker, PT recommending ongoing home health PT OT, he will also need multiple medical devices as outlined above for stabilization with mobility.  Antibiotics have been narrowed to ciprofloxacin given staph epi and resistances noted on cultures.  Otherwise stable for discharge home with wound VAC Home health PT OT and wound care.  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 intake -back to baseline   Diabetes mellitus type 2, uncontrolled with hyperglycemia - HbA1c 8.2  - He uses Lantus insulin at home along with Jardiance and metformin.   -Continue sliding scale insulin, given multiple days perioperatively with poor p.o. intake we will hold off on adjusting long-acting insulin until more consistent diet and p.o. intake   Essential hypertension -Continue metoprolol only, hold other home medications  Hyperlipidemia Was on statin at home.  Discharge Instructions  Discharge Instructions     Call MD for:  extreme fatigue   Complete by: As directed    Call MD for:  severe uncontrolled pain   Complete by: As directed    Call MD for:  temperature >100.4   Complete by: As directed    Diet - low sodium heart healthy   Complete by: As directed    Increase activity slowly   Complete by: As directed    No wound care   Complete by: As directed    Continue wound vac as instructed - follow up with podiatry as scheduled      Allergies as of 03/22/2021       Reactions   Codeine Sulfate [codeine]    UNSPECIFIED REACTION         Medication List     STOP taking these medications    doxycycline 100 MG tablet Commonly known as: ADOXA       TAKE these medications    acetaminophen 500 MG tablet Commonly known as: TYLENOL Take 2 tablets (1,000 mg total) by mouth every 6 (six) hours as needed for mild pain or fever.   aspirin EC 81 MG tablet Take 81 mg by mouth daily.   atorvastatin 80 MG tablet Commonly known as: LIPITOR Take 80  mg by mouth daily.   calcium carbonate 500 MG chewable tablet Commonly known as: TUMS - dosed in mg elemental calcium Chew 1-2 tablets by mouth daily as needed for indigestion or heartburn.   ciprofloxacin 500 MG tablet Commonly known as: Cipro Take 1 tablet (500 mg total) by mouth 2 (two) times daily for 12 days.   ibuprofen 200 MG  tablet Commonly known as: ADVIL Take 400 mg by mouth every 6 (six) hours as needed for mild pain.   Lantus 100 UNIT/ML injection Generic drug: insulin glargine Inject 25 Units into the skin 2 (two) times daily.   lisinopril 10 MG tablet Commonly known as: ZESTRIL Take 10 mg by mouth daily.   metFORMIN 850 MG tablet Commonly known as: GLUCOPHAGE Take 850 mg by mouth 3 (three) times daily.   metoprolol succinate 50 MG 24 hr tablet Commonly known as: TOPROL-XL Take 1 tablet (50 mg total) by mouth daily. Take with or immediately following a meal.   oxyCODONE 5 MG immediate release tablet Commonly known as: Oxy IR/ROXICODONE Take 1-2 tablets (5-10 mg total) by mouth every 6 (six) hours as needed for up to 5 days for moderate pain or severe pain.   SANTYL EX Apply 1 application topically daily. With wound dressings   sitaGLIPtin 100 MG tablet Commonly known as: JANUVIA Take 100 mg by mouth every morning.               Durable Medical Equipment  (From admission, onward)           Start     Ordered   03/17/21 0932  For home use only DME Tub bench  Once        03/17/21 0931   03/17/21 0931  For home use only DME Walker rolling  Once       Question Answer Comment  Walker: With 5 Inch Wheels   Patient needs a walker to treat with the following condition Osteomyelitis (HCC)      03/17/21 0931            Allergies  Allergen Reactions   Codeine Sulfate [Codeine]     UNSPECIFIED REACTION     Consultations: Podiatry, Dr. Samuella Cota   Procedures/Studies: MR FOOT LEFT WO CONTRAST  Result Date: 03/14/2021 CLINICAL DATA:  Medial foot ulcer. EXAM: MRI OF THE LEFT FOOT WITHOUT CONTRAST TECHNIQUE: Multiplanar, multisequence MR imaging of the left ankle was performed. No intravenous contrast was administered. COMPARISON:  Left foot x-rays from same day. Left ankle x-rays dated March 01, 2021. FINDINGS: Bones/Joint/Cartilage Medial dislocation of the navicular with  respect to the kidney a forms, as well as the calcaneus with respect to the cuboid. Mild impaction fracture of the anterolateral calcaneus (series 8, image 18). Patchy marrow edema involving the calcaneus, cuboid, cuneiforms, and navicular, likely related to neuropathic arthropathy. No joint effusion. Ligaments Lisfranc ligament is intact. Muscles and Tendons Flexor and extensor tendons are intact. Split tear of the peroneal brevis tendon (series 5, image 17). Intact Achilles tendon with mild tendinosis. Soft tissue Soft tissue ulceration of the medial midfoot overlying the dislocated navicular. Diffuse soft tissue swelling. No fluid collection or hematoma. No soft tissue mass. IMPRESSION: 1. 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 2. Soft tissue ulceration of the medial midfoot overlying the dislocated navicular, extending to bone. Probable early osteomyelitis of the medial navicular given exposed bone. No abscess. 3. Impaction fracture of the anterolateral calcaneus. 4. Split tear of the peroneal brevis tendon. Electronically  Signed   By: Obie Dredge M.D.   On: 03/14/2021 12:50   DG Foot 2 Views Left  Result Date: 03/16/2021 CLINICAL DATA:  Postoperative left foot surgery EXAM: LEFT FOOT - 2 VIEW COMPARISON:  03/14/2021 FINDINGS: AP and lateral radiographs of the left foot obtained with overlying splint material. Interval partial navicular resection. Naviculocuneiform and calcaneocuboid joints remain dislocated. No new fractures. Expected postoperative changes within the soft tissues. IMPRESSION: 1. Interval partial navicular resection. 2. Naviculocuneiform and calcaneocuboid joints remain dislocated. Electronically Signed   By: [name] D.O.   On: 03/16/2021 15:48   DG Foot Complete Left  Result Date: 03/14/2021 CLINICAL DATA:  Left foot wound medially with concern for possible osteo myelitis. EXAM: LEFT FOOT - COMPLETE 3+ VIEW COMPARISON:  Ankle  film from 03/01/2021 FINDINGS: There are changes consistent with dislocation of the cuneiforms and cuboid with respect to the tarsal navicular bone, talus and calcaneus. The bony prominence medially relates to the navicular bone secondary to the dislocation. These changes were present on the prior exam. Soft tissue wound is noted over the navicular bone consistent with the given clinical history. No discrete bony erosive changes are identified to suggest osteomyelitis. No acute fracture is noted. IMPRESSION: Changes consistent with dislocation between the cuneiforms and tarsal navicular bone. This creates a bony prominence medially which corresponds to the soft tissue wound. No discrete bony erosive changes are noted to suggest osteomyelitis. Electronically Signed   By: Alcide Clever M.D.   On: 03/14/2021 01:15   VAS [place] ABI WITH/WO TBI  Result Date: 03/15/2021  LOWER EXTREMITY DOPPLER STUDY Patient Name:  ARISTIDIS TALERICO  Date of Exam:   03/15/2021 Medical Rec #: [id number]        Accession #:    [id number] Date of Birth: 26-Mar-1956        Patient Gender: M Patient Age:   65 years Exam Location:  [facility] Procedure:      VAS [place] ABI WITH/WO TBI Referring Phys: Margie Ege --------------------------------------------------------------------------------  Indications: Ulceration. High Risk Factors: Hypertension, Diabetes.  Comparison Study: No prior studies. Performing Technologist: Olen Cordial RVT  Examination Guidelines: A complete evaluation includes at minimum, Doppler waveform signals and systolic blood pressure reading at the level of bilateral brachial, anterior tibial, and posterior tibial arteries, when vessel segments are accessible. Bilateral testing is considered an integral part of a complete examination. Photoelectric Plethysmograph (PPG) waveforms and toe systolic pressure readings are included as required and additional duplex testing as needed. Limited examinations for reoccurring  indications may be performed as noted.  ABI Findings: +---------+------------------+-----+---------+--------+ Right    Rt Pressure (mmHg)IndexWaveform Comment  +---------+------------------+-----+---------+--------+ Brachial 152                    triphasic         +---------+------------------+-----+---------+--------+ PTA      169               1.11 triphasic         +---------+------------------+-----+---------+--------+ DP       148               0.97 triphasic         +---------+------------------+-----+---------+--------+ Great Toe83                0.55                   +---------+------------------+-----+---------+--------+ +---------+------------------+-----+---------+-------+ Left     Lt Pressure (mmHg)IndexWaveform Comment +---------+------------------+-----+---------+-------+  Brachial 139                    triphasic        +---------+------------------+-----+---------+-------+ PTA      117               0.77 triphasic        +---------+------------------+-----+---------+-------+ DP       131               0.86 triphasic        +---------+------------------+-----+---------+-------+ Great Toe55                0.36                  +---------+------------------+-----+---------+-------+ +-------+-----------+-----------+------------+------------+ ABI/TBIToday's ABIToday's TBIPrevious ABIPrevious TBI +-------+-----------+-----------+------------+------------+ Right  1.11       0.55                                +-------+-----------+-----------+------------+------------+ Left   0.86       0.36                                +-------+-----------+-----------+------------+------------+  Summary: Right: Resting right ankle-brachial index is within normal range. No evidence of significant right lower extremity arterial disease. The right toe-brachial index is abnormal. Left: Resting left ankle-brachial index indicates mild left lower  extremity arterial disease. The left toe-brachial index is abnormal.  *See table(s) above for measurements and observations.  Electronically signed by Lemar Livings MD on 03/15/2021 at 4:55:57 PM.    Final      Subjective: Wound VAC leak overnight, bandage replaced this morning otherwise no acute issues or events denies nausea vomiting diarrhea constipation headache fevers chills or chest pain   Discharge Exam: Vitals:   03/21/21 2054 03/22/21 0359  BP: 131/78 132/76  Pulse: 81 74  Resp:    Temp: 99.9 F (37.7 C) 98.1 F (36.7 C)  SpO2: 97% 97%   Vitals:   03/21/21 0649 03/21/21 1352 03/21/21 2054 03/22/21 0359  BP: 131/73 140/89 131/78 132/76  Pulse: 72 78 81 74  Resp: 16 18    Temp: 98.3 F (36.8 C) 98.6 F (37 C) 99.9 F (37.7 C) 98.1 F (36.7 C)  TempSrc: Oral  Oral Oral  SpO2: 98% 97% 97% 97%  Weight: 82.2 kg     Height:        General: Pt is alert, awake, not in acute distress Cardiovascular: RRR, S1/S2 +, no rubs, no gallops Respiratory: CTA bilaterally, no wheezing, no rhonchi Abdominal: Soft, NT, ND, bowel sounds + Extremities: no edema, no cyanosis    The results of significant diagnostics from this hospitalization (including imaging, microbiology, ancillary and laboratory) are listed below for reference.     Microbiology: Recent Results (from the past 240 hour(s))  Blood culture (routine x 2)     Status: None   Collection Time: 03/14/21 12:40 AM   Specimen: BLOOD  Result Value Ref Range Status   Specimen Description   Final    BLOOD RIGHT ANTECUBITAL Performed at [facility], [address] [place], [place] [address]    Special Requests   Final    BOTTLES DRAWN AEROBIC AND ANAEROBIC Blood Culture adequate volume Performed at [facility], [address], [place], [place] [address]    Culture   Final  NO GROWTH 5 DAYS Performed at [facility], [address] [place], [place] [address]    Report  Status 03/19/2021 FINAL  Final  Blood culture (routine x 2)     Status: None   Collection Time: 03/14/21  1:10 AM   Specimen: BLOOD LEFT FOREARM  Result Value Ref Range Status   Specimen Description   Final    BLOOD LEFT FOREARM Performed at [facility], [address], [place], [place] [address]    Special Requests   Final    BOTTLES DRAWN AEROBIC AND ANAEROBIC Blood Culture adequate volume Performed at [facility], [address], [place], [place] [address]    Culture   Final    NO GROWTH 5 DAYS Performed at [facility], [address] [place], [place] [address]    Report Status 03/19/2021 FINAL  Final  Resp Panel by RT-PCR (Flu A&B, Covid) Nasopharyngeal Swab     Status: None   Collection Time: 03/14/21  1:10 AM   Specimen: Nasopharyngeal Swab; Nasopharyngeal(NP) swabs in vial transport medium  Result Value Ref Range Status   SARS Coronavirus 2 by RT PCR NEGATIVE NEGATIVE Final    Comment: (NOTE) SARS-CoV-2 target nucleic acids are NOT DETECTED.  The SARS-CoV-2 RNA is generally detectable in upper respiratory specimens during the acute phase of infection. The lowest concentration of SARS-CoV-2 viral copies this assay can detect is 138 copies/mL. A negative result does not preclude SARS-Cov-2 infection and should not be used as the sole basis for treatment or other patient management decisions. A negative result may occur with  improper specimen collection/handling, submission of specimen other than nasopharyngeal swab, presence of viral mutation(s) within the areas targeted by this assay, and inadequate number of viral copies(<138 copies/mL). A negative result must be combined with clinical observations, patient history, and epidemiological information. The expected result is Negative.  Fact Sheet for Patients:  BloggerCourse.com  Fact Sheet for Healthcare Providers:   SeriousBroker.it  This test is no t yet approved or cleared by the [place] FDA and  has been authorized for detection and/or diagnosis of SARS-CoV-2 by FDA under an Emergency Use Authorization (EUA). This EUA will remain  in effect (meaning this test can be used) for the duration of the COVID-19 declaration under Section 564(b)(1) of the Act, 21 U.S.C.section 360bbb-3(b)(1), unless the authorization is terminated  or revoked sooner.       Influenza A by PCR NEGATIVE NEGATIVE Final   Influenza B by PCR NEGATIVE NEGATIVE Final    Comment: (NOTE) The Xpert Xpress SARS-CoV-2/FLU/RSV plus assay is intended as an aid in the diagnosis of influenza from Nasopharyngeal swab specimens and should not be used as a sole basis for treatment. Nasal washings and aspirates are unacceptable for Xpert Xpress SARS-CoV-2/FLU/RSV testing.  Fact Sheet for Patients: BloggerCourse.com  Fact Sheet for Healthcare Providers: SeriousBroker.it  This test is not yet approved or cleared by the [place] FDA and has been authorized for detection and/or diagnosis of SARS-CoV-2 by FDA under an Emergency Use Authorization (EUA). This EUA will remain in effect (meaning this test can be used) for the duration of the COVID-19 declaration under Section 564(b)(1) of the Act, 21 U.S.C. section 360bbb-3(b)(1), unless the authorization is terminated or revoked.  Performed at [facility], [address] [place], [place] [address]   MRSA Next Gen by PCR, Nasal     Status: None   Collection Time: 03/15/21  4:30 AM  Specimen: Nasal Mucosa; Nasal Swab  Result Value Ref Range Status   MRSA by PCR Next Gen NOT DETECTED NOT DETECTED Final    Comment: (NOTE) The GeneXpert MRSA Assay (FDA approved for NASAL specimens only), is one component of a comprehensive MRSA colonization surveillance program. It is not intended  to diagnose MRSA infection nor to guide or monitor treatment for MRSA infections. Test performance is not FDA approved in patients less than 68 years old. Performed at [facility], [address] [place], [place] [address]   Aerobic/Anaerobic Culture w Gram Stain (surgical/deep wound)     Status: None   Collection Time: 03/16/21 10:53 AM   Specimen: PATH Soft tissue resection  Result Value Ref Range Status   Specimen Description   Final    TISSUE LEFT FOOT Performed at [facility], [address] [place], [place] [address]    Special Requests   Final    NONE Performed at [facility], [address] [place], [place] [address]    Gram Stain   Final    FEW WBC PRESENT,BOTH PMN AND MONONUCLEAR NO ORGANISMS SEEN    Culture   Final    RARE STAPHYLOCOCCUS EPIDERMIDIS CRITICAL VALUE NOTED.  VALUE IS CONSISTENT WITH PREVIOUSLY REPORTED AND CALLED VALUE. NO ANAEROBES ISOLATED Performed at [facility], [address], [place], [place] [address]    Report Status 03/21/2021 FINAL  Final   Organism ID, Bacteria STAPHYLOCOCCUS EPIDERMIDIS  Final      Susceptibility   Staphylococcus epidermidis - MIC*    CIPROFLOXACIN <=0.5 SENSITIVE Sensitive     ERYTHROMYCIN >=8 RESISTANT Resistant     GENTAMICIN <=0.5 SENSITIVE Sensitive     OXACILLIN >=4 RESISTANT Resistant     TETRACYCLINE >=16 RESISTANT Resistant     VANCOMYCIN 2 SENSITIVE Sensitive     TRIMETH/SULFA 160 RESISTANT Resistant     CLINDAMYCIN >=8 RESISTANT Resistant     RIFAMPIN <=0.5 SENSITIVE Sensitive     Inducible Clindamycin NEGATIVE Sensitive     * RARE STAPHYLOCOCCUS EPIDERMIDIS  Aerobic/Anaerobic Culture w Gram Stain (surgical/deep wound)     Status: None   Collection Time: 03/16/21 11:12 AM   Specimen: PATH Soft tissue resection  Result Value Ref Range Status   Specimen Description   Final    FOOT LEFT BONE Performed at [facility],  [address], [place], [place] [address]    Special Requests   Final    NONE Performed at [facility], [address] [place], [place] [address]    Gram Stain   Final    WBC PRESENT, PREDOMINANTLY MONONUCLEAR NO ORGANISMS SEEN    Culture   Final    FEW STAPHYLOCOCCUS EPIDERMIDIS NO ANAEROBES ISOLATED CRITICAL RESULT CALLED TO, READ BACK BY AND VERIFIED WITH: DR PRICE [id number] FCP Performed at [facility], [address] [place], [place] [address]    Report Status 03/21/2021 FINAL  Final   Organism ID, Bacteria STAPHYLOCOCCUS EPIDERMIDIS  Final      Susceptibility   Staphylococcus epidermidis - MIC*    CIPROFLOXACIN <=0.5 SENSITIVE Sensitive     ERYTHROMYCIN >=8 RESISTANT Resistant     GENTAMICIN <=0.5 SENSITIVE Sensitive     OXACILLIN >=4 RESISTANT Resistant     TETRACYCLINE >=16 RESISTANT Resistant     VANCOMYCIN 2 SENSITIVE Sensitive     TRIMETH/SULFA 160 RESISTANT Resistant     CLINDAMYCIN >=8 RESISTANT Resistant  RIFAMPIN <=0.5 SENSITIVE Sensitive     Inducible Clindamycin NEGATIVE Sensitive     * FEW STAPHYLOCOCCUS EPIDERMIDIS     Labs: BNP (last 3 results) No results for input(s): BNP in the last 8760 hours. Basic Metabolic Panel: Recent Labs  Lab 03/16/21 0332 03/17/21 0326 03/18/21 0325 03/19/21 0327 03/20/21 0453 03/21/21 0323  NA 138 134* 135 136 133*  --   K 4.8 4.3 4.6 4.6 4.5  --   CL 104 103 104 105 100  --   CO2 --   GLUCOSE 237* 116* 200* 202* 274*  --   BUN 26* 19  --   CREATININE 0.79 0.93 0.92 0.98 0.90 0.95  CALCIUM 9.0 8.6* 8.8* 8.7* 8.6*  --    Liver Function Tests: No results for input(s): AST, ALT, ALKPHOS, BILITOT, PROT, ALBUMIN in the last 168 hours.  No results for input(s): LIPASE, AMYLASE in the last 168 hours. No results for input(s): AMMONIA in the last 168 hours. CBC: Recent Labs  Lab 03/16/21 0332 03/17/21 0326 03/18/21 0325 03/19/21 0327 03/20/21 0453   WBC 11.1* 8.5 7.4 9.0 9.6  HGB 12.6* 11.2* 11.4* 11.5* 11.6*  HCT 39.2 34.5* 35.3* 35.8* 34.8*  MCV 96.3 94.8 94.4 94.2 93.5  PLT 253 245 252 253 248   Cardiac Enzymes: No results for input(s): CKTOTAL, CKMB, CKMBINDEX, TROPONINI in the last 168 hours. BNP: Invalid input(s): POCBNP CBG: Recent Labs  Lab 03/21/21 0824 03/21/21 1151 03/21/21 1625 03/21/21 2050 03/22/21 0714  GLUCAP 247* 249* 319* 286* 229*   D-Dimer No results for input(s): DDIMER in the last 72 hours. Hgb A1c No results for input(s): HGBA1C in the last 72 hours. Lipid Profile No results for input(s): CHOL, HDL, LDLCALC, TRIG, CHOLHDL, LDLDIRECT in the last 72 hours. Thyroid function studies No results for input(s): TSH, T4TOTAL, T3FREE, THYROIDAB in the last 72 hours.  Invalid input(s): FREET3 Anemia work up No results for input(s): VITAMINB12, FOLATE, FERRITIN, TIBC, IRON, RETICCTPCT in the last 72 hours. Urinalysis    Component Value Date/Time   COLORURINE YELLOW 12/25/2017 1006   APPEARANCEUR CLEAR 12/25/2017 1006   LABSPEC 1.018 12/25/2017 1006   PHURINE 6.0 12/25/2017 1006   GLUCOSEU NEGATIVE 12/25/2017 1006   HGBUR SMALL (A) 12/25/2017 1006   BILIRUBINUR NEGATIVE 12/25/2017 1006   KETONESUR NEGATIVE 12/25/2017 1006   PROTEINUR 30 (A) 12/25/2017 1006   NITRITE NEGATIVE 12/25/2017 1006   LEUKOCYTESUR NEGATIVE 12/25/2017 1006   Sepsis Labs Invalid input(s): PROCALCITONIN,  WBC,  LACTICIDVEN Microbiology Recent Results (from the past 240 hour(s))  Blood culture (routine x 2)     Status: None   Collection Time: 03/14/21 12:40 AM   Specimen: BLOOD  Result Value Ref Range Status   Specimen Description   Final    BLOOD RIGHT ANTECUBITAL Performed at [facility], [address] [place], [place] [address]    Special Requests   Final    BOTTLES DRAWN AEROBIC AND ANAEROBIC Blood Culture adequate volume Performed at [facility], [address]  [place], [place] [address]    Culture   Final    NO GROWTH 5 DAYS Performed at [facility], [address] [place], [place] [address]    Report Status 03/19/2021 FINAL  Final  Blood culture (routine x 2)     Status: None   Collection Time: 03/14/21  1:10 AM   Specimen: BLOOD LEFT FOREARM  Result Value Ref Range  Status   Specimen Description   Final    BLOOD LEFT FOREARM Performed at [facility], [address] [place], [place] [address]    Special Requests   Final    BOTTLES DRAWN AEROBIC AND ANAEROBIC Blood Culture adequate volume Performed at [facility], [address], [place], [place] [address]    Culture   Final    NO GROWTH 5 DAYS Performed at [facility], [address] [place], [place] [address]    Report Status 03/19/2021 FINAL  Final  Resp Panel by RT-PCR (Flu A&B, Covid) Nasopharyngeal Swab     Status: None   Collection Time: 03/14/21  1:10 AM   Specimen: Nasopharyngeal Swab; Nasopharyngeal(NP) swabs in vial transport medium  Result Value Ref Range Status   SARS Coronavirus 2 by RT PCR NEGATIVE NEGATIVE Final    Comment: (NOTE) SARS-CoV-2 target nucleic acids are NOT DETECTED.  The SARS-CoV-2 RNA is generally detectable in upper respiratory specimens during the acute phase of infection. The lowest concentration of SARS-CoV-2 viral copies this assay can detect is 138 copies/mL. A negative result does not preclude SARS-Cov-2 infection and should not be used as the sole basis for treatment or other patient management decisions. A negative result may occur with  improper specimen collection/handling, submission of specimen other than nasopharyngeal swab, presence of viral mutation(s) within the areas targeted by this assay, and inadequate number of viral copies(<138 copies/mL). A negative result must be combined with clinical observations, patient history, and epidemiological information. The expected result is  Negative.  Fact Sheet for Patients:  BloggerCourse.com  Fact Sheet for Healthcare Providers:  SeriousBroker.it  This test is no t yet approved or cleared by the [place] FDA and  has been authorized for detection and/or diagnosis of SARS-CoV-2 by FDA under an Emergency Use Authorization (EUA). This EUA will remain  in effect (meaning this test can be used) for the duration of the COVID-19 declaration under Section 564(b)(1) of the Act, 21 U.S.C.section 360bbb-3(b)(1), unless the authorization is terminated  or revoked sooner.       Influenza A by PCR NEGATIVE NEGATIVE Final   Influenza B by PCR NEGATIVE NEGATIVE Final    Comment: (NOTE) The Xpert Xpress SARS-CoV-2/FLU/RSV plus assay is intended as an aid in the diagnosis of influenza from Nasopharyngeal swab specimens and should not be used as a sole basis for treatment. Nasal washings and aspirates are unacceptable for Xpert Xpress SARS-CoV-2/FLU/RSV testing.  Fact Sheet for Patients: BloggerCourse.com  Fact Sheet for Healthcare Providers: SeriousBroker.it  This test is not yet approved or cleared by the [place] FDA and has been authorized for detection and/or diagnosis of SARS-CoV-2 by FDA under an Emergency Use Authorization (EUA). This EUA will remain in effect (meaning this test can be used) for the duration of the COVID-19 declaration under Section 564(b)(1) of the Act, 21 U.S.C. section 360bbb-3(b)(1), unless the authorization is terminated or revoked.  Performed at [facility], [address], [place], [place] [address]   MRSA Next Gen by PCR, Nasal     Status: None   Collection Time: 03/15/21  4:30 AM   Specimen: Nasal Mucosa; Nasal Swab  Result Value Ref Range Status   MRSA by PCR Next Gen NOT DETECTED NOT DETECTED Final    Comment: (NOTE) The GeneXpert MRSA Assay (FDA approved  for NASAL specimens only), is one component of a comprehensive MRSA colonization surveillance program. It is not intended to diagnose MRSA infection  nor to guide or monitor treatment for MRSA infections. Test performance is not FDA approved in patients less than 62 years old. Performed at [facility], [address], [place], [place] [address]   Aerobic/Anaerobic Culture w Gram Stain (surgical/deep wound)     Status: None   Collection Time: 03/16/21 10:53 AM   Specimen: PATH Soft tissue resection  Result Value Ref Range Status   Specimen Description   Final    TISSUE LEFT FOOT Performed at [facility], [address], [place], [place] [address]    Special Requests   Final    NONE Performed at [facility], [address], [place], [place] [address]    Gram Stain   Final    FEW WBC PRESENT,BOTH PMN AND MONONUCLEAR NO ORGANISMS SEEN    Culture   Final    RARE STAPHYLOCOCCUS EPIDERMIDIS CRITICAL VALUE NOTED.  VALUE IS CONSISTENT WITH PREVIOUSLY REPORTED AND CALLED VALUE. NO ANAEROBES ISOLATED Performed at [facility], [address], [place], [place] [address]    Report Status 03/21/2021 FINAL  Final   Organism ID, Bacteria STAPHYLOCOCCUS EPIDERMIDIS  Final      Susceptibility   Staphylococcus epidermidis - MIC*    CIPROFLOXACIN <=0.5 SENSITIVE Sensitive     ERYTHROMYCIN >=8 RESISTANT Resistant     GENTAMICIN <=0.5 SENSITIVE Sensitive     OXACILLIN >=4 RESISTANT Resistant     TETRACYCLINE >=16 RESISTANT Resistant     VANCOMYCIN 2 SENSITIVE Sensitive     TRIMETH/SULFA 160 RESISTANT Resistant     CLINDAMYCIN >=8 RESISTANT Resistant     RIFAMPIN <=0.5 SENSITIVE Sensitive     Inducible Clindamycin NEGATIVE Sensitive     * RARE STAPHYLOCOCCUS EPIDERMIDIS  Aerobic/Anaerobic Culture w Gram Stain (surgical/deep wound)     Status: None   Collection Time: 03/16/21 11:12 AM   Specimen: PATH Soft tissue resection   Result Value Ref Range Status   Specimen Description   Final    FOOT LEFT BONE Performed at [facility], [address], [place], [place] [address]    Special Requests   Final    NONE Performed at [facility], [address], [place], [place] [address]    Gram Stain   Final    WBC PRESENT, PREDOMINANTLY MONONUCLEAR NO ORGANISMS SEEN    Culture   Final    FEW STAPHYLOCOCCUS EPIDERMIDIS NO ANAEROBES ISOLATED CRITICAL RESULT CALLED TO, READ BACK BY AND VERIFIED WITH: DR PRICE [id number] FCP Performed at [facility], [address] [place], [place] [address]    Report Status 03/21/2021 FINAL  Final   Organism ID, Bacteria STAPHYLOCOCCUS EPIDERMIDIS  Final      Susceptibility   Staphylococcus epidermidis - MIC*    CIPROFLOXACIN <=0.5 SENSITIVE Sensitive     ERYTHROMYCIN >=8 RESISTANT Resistant     GENTAMICIN <=0.5 SENSITIVE Sensitive     OXACILLIN >=4 RESISTANT Resistant     TETRACYCLINE >=16 RESISTANT Resistant     VANCOMYCIN 2 SENSITIVE Sensitive     TRIMETH/SULFA 160 RESISTANT Resistant     CLINDAMYCIN >=8 RESISTANT Resistant     RIFAMPIN <=0.5 SENSITIVE Sensitive     Inducible Clindamycin NEGATIVE Sensitive     * FEW STAPHYLOCOCCUS EPIDERMIDIS     Time coordinating discharge: Over 30 minutes  SIGNED:   Azucena Fallen, DO [facility] 03/22/2021, 7:50 AM Pager   If 7PM-7AM, please contact night-coverage [email or web address]

## 2021-03-21 NOTE — Progress Notes (Signed)
Pt left foot wound vac dressing changed due to seal being broken. Pt tolerated well. No needs at this this time.

## 2021-03-21 NOTE — Progress Notes (Signed)
  Subjective:  Patient ID: [name], male    DOB: [birth year],  MRN: [id number]  Patient seen bedside. Pain controlled. Does not feel ready to go home. Objective:   Vitals:   03/20/21 2100 03/21/21 0649  BP: 130/75 131/73  Pulse: 80 72  Resp: 18 16  Temp: 98.5 F (36.9 C) 98.3 F (36.8 C)  SpO2: 99% 98%   General AA&O x3. Normal mood and affect.  Vascular Dorsalis pedis and posterior tibial pulses 2/4 bilat. Brisk capillary refill to all digits. Pedal hair present.  Neurologic Epicritic sensation grossly absent.  Dermatologic Wound healing reasonably well. No exposed bone. No skin tenting. No continued erythema or warmth  Orthopedic: MMT 5/5 in dorsiflexion, plantarflexion, inversion, and eversion.    Results for orders placed or performed during the hospital encounter of 03/14/21 (from the past 24 hour(s))  Vancomycin, trough     Status: None   Collection Time: 03/20/21  1:33 PM  Result Value Ref Range   Vancomycin Tr 20 15 - 20 ug/mL  Glucose, capillary     Status: Abnormal   Collection Time: 03/20/21  4:24 PM  Result Value Ref Range   Glucose-Capillary 293 (H) 70 - 99 mg/dL  Glucose, capillary     Status: Abnormal   Collection Time: 03/20/21  8:47 PM  Result Value Ref Range   Glucose-Capillary 326 (H) 70 - 99 mg/dL  Creatinine, serum     Status: None   Collection Time: 03/21/21  3:23 AM  Result Value Ref Range   Creatinine, Ser 0.95 0.61 - 1.24 mg/dL   GFR, Estimated >60 >60 mL/min  Glucose, capillary     Status: Abnormal   Collection Time: 03/21/21  8:24 AM  Result Value Ref Range   Glucose-Capillary 247 (H) 70 - 99 mg/dL  Glucose, capillary     Status: Abnormal   Collection Time: 03/21/21 11:51 AM  Result Value Ref Range   Glucose-Capillary 249 (H) 70 - 99 mg/dL   Assessment & Plan:  Patient was evaluated and treated and all questions answered.  Left foot ulceration, osteomyelitis in setting of acute Charcot -Labs and culture reviewed -Dressing intact  today without strikethrough - left intact. Change VAC tomorrow. -Believed surgical cure of OM given full resection of infected bone. Plan for PO Abx at discharge x2 weeks - G+ coverage. -Pending VAC approval -Pending HHC for help with changing VAC -Order knee scooter for safe NWB at home -[facility] for discharge once needs met  Evelina Bucy, DPM  Accessible via secure chat for questions or concerns.

## 2021-03-21 NOTE — TOC Progression Note (Signed)
Transition of Care [facility]) - Progression Note    Patient Details  Name: [name] MRN: [id number] Date of Birth: 1956-03-16  Transition of Care [facility]) CM/SW Contact  Ludwig Clarks, [place] Phone Number: 03/21/2021, 10:06 AM  Clinical Narrative:   Met with wife and patient- they have already ordered  (via [facility]) a knee scooter, walker and plan to also look into a 3in1, grab bar and elevated toilet riser. Per patient, his  Lorella Nimrod has an OOP of $700.  Will need HH (VAC/IV ABX), PT and OT thru an in-network provider.   Expected Discharge Plan: [place] Barriers to Discharge: Continued Medical Work up  Expected Discharge Plan and Services Expected Discharge Plan: [place]   Discharge Planning Services: CM Consult Post Acute Care Choice: Durable Medical Equipment, Home Health Living arrangements for the past 2 months: Single Family Home Expected Discharge Date: 03/21/21               DME Arranged: Gilford Rile rolling, Tub bench DME Agency: [employer] Date DME Agency Contacted: 03/17/21 Time DME Agency Contacted: [phone] Representative spoke with at DME Agency: [email or web address]             Social Determinants of Health ([place]) Interventions    Readmission Risk Interventions No flowsheet data found.

## 2021-03-21 NOTE — Plan of Care (Signed)
  Problem: Clinical Measurements: Goal: Cardiovascular complication will be avoided Outcome: Progressing   Problem: Clinical Measurements: Goal: Respiratory complications will improve Outcome: Progressing   Problem: Activity: Goal: Risk for activity intolerance will decrease Outcome: Progressing   Problem: Pain Managment: Goal: General experience of comfort will improve Outcome: Progressing   Problem: Safety: Goal: Ability to remain free from injury will improve Outcome: Progressing   

## 2021-03-21 NOTE — Progress Notes (Signed)
PROGRESS NOTE    [name]  SAY:[id number] DOB: [birth year] DOA: 03/14/2021 PCP: [name], MD   Brief Narrative:  65 y.o. male with medical history significant of DM2, HLD, HTN. Presenting with left foot ulcer. This ulcer has shown up in the last week or two. It's in the medial side of the left foot. He was referred by his PCP to wound care. He was started on santyl dressings and doxycycline. The wound did not seem to improve. So he was seen by podiatry. They recommended getting an MRI and vascular studies. That was scheduled to happen this week.  Patient presented to the emergency department due to onset of fever.  He was hospitalized for further management.    Patient tolerated debridement x2 with navicular resection and wound VAC placement, medically stable for discharge at this point transitioning to p.o. Keflex given cultures with staph epi for 14 days per discussion with podiatry and prolonged wound VAC to be managed outpatient with podiatry.  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 Keflex, tentative plan for discharge in the next 12 to 24 hours pending DME delivery with wound VAC now that patient does not require PICC line or IV antibiotics he is otherwise medically stable for discharge  Normocytic anemia -Likely hemodilution all, follow repeat labs no signs or symptoms of bleeding at this point, bandage clean dry intact  Acute kidney injury, resolved -In the setting of poor p.o. intake -back to baseline   Diabetes mellitus type 2, uncontrolled with hyperglycemia - HbA1c 8.2  - He  uses Lantus insulin at home along with Jardiance and metformin.   -Continue sliding scale insulin, given multiple days perioperatively with poor p.o. intake we will hold off on adjusting long-acting insulin until more consistent diet and p.o. intake   Essential hypertension -Continue metoprolol only, hold other home medications  Hyperlipidemia Was on statin at home.   DVT prophylaxis: Lovenox Code Status: Full Family Communication: None present  Status is: Inpatient  Dispo: The patient is from: Home              Anticipated d/c is to: To be determined              Anticipated d/c date is: Imminent pending DME delivery              Patient currently is medically stable for discharge  Consultants:  Podiatry  Procedures:  Debridement 03/16/2021 Tentative repeat debridement 03/19/2021  Antimicrobials:  Vancomycin, cefepime, Flagyl -discontinued 03/21/2021 Keflex started 03/21/2021  Subjective: No acute issues or events overnight, pain currently well controlled, looking forward to discharge, requesting increased ambulation today which is certainly reasonable per podiatry recommendations  Objective: Vitals:   03/20/21 0959 03/20/21 1328 03/20/21 2100 03/21/21 0649  BP: 110/64 122/63 130/75 131/73  Pulse: 80 81 80 72  Resp: 18 18 18 16   Temp: 99.1 F (37.3 C) 98.4 F (36.9 C) 98.5 F (36.9 C) 98.3 F (36.8 C)  TempSrc: Oral Oral Oral Oral  SpO2: 96% 100% 99% 98%  Weight:    82.2 kg  Height:        Intake/Output Summary (Last 24 hours) at 03/21/2021 1152 Last data filed at 03/21/2021 0916 Gross per 24 hour  Intake 3110.02 ml  Output 2650 ml  Net 460.02 ml    Filed Weights   03/14/21 0039 03/21/21 0649  Weight: 84.7 kg 82.2 kg    Examination:  General exam: Appears calm and comfortable  Respiratory system: Clear to auscultation. Respiratory effort normal. Cardiovascular system: S1 & S2 heard, RRR. No JVD, murmurs, rubs, gallops or clicks. No pedal  edema. Gastrointestinal system: Abdomen is nondistended, soft and nontender. No organomegaly or masses felt. Normal bowel sounds heard. Central nervous system: Alert and oriented. No focal neurological deficits. Extremities: Symmetric 5 x 5 power, wound VAC clean dry intact without leak Skin: No rashes, lesions Psychiatry: Judgement and insight appear normal. Mood & affect appropriate.   Data Reviewed: I have personally reviewed following labs and imaging studies  CBC: Recent Labs  Lab 03/16/21 0332 03/17/21 0326 03/18/21 0325 03/19/21 0327 03/20/21 0453  WBC 11.1* 8.5 7.4 9.0 9.6  HGB 12.6* 11.2* 11.4* 11.5* 11.6*  HCT 39.2 34.5* 35.3* 35.8* 34.8*  MCV 96.3 94.8 94.4 94.2 93.5  PLT 253 245 252 253 248    Basic Metabolic Panel: Recent Labs  Lab 03/16/21 0332 03/17/21 0326 03/18/21 0325 03/19/21 0327 03/20/21 0453 03/21/21 0323  NA 138 134* 135 136 133*  --   K 4.8 4.3 4.6 4.6 4.5  --   CL 104 103 104 105 100  --   CO2 25 23 26 24 25   --   GLUCOSE 237* 116* 200* 202* 274*  --   BUN 22 22 20  26* 19  --   CREATININE 0.79 0.93 0.92 0.98 0.90 0.95  CALCIUM 9.0 8.6* 8.8* 8.7* 8.6*  --     GFR: Estimated Creatinine Clearance: 92.5 mL/min (by C-G formula based on SCr of 0.95 mg/dL). Liver Function Tests: Recent Labs  Lab 03/15/21 0332  AST 12*  ALT 15  ALKPHOS 60  BILITOT 0.7  PROT 5.7*  ALBUMIN 2.7*    No results for input(s): LIPASE, AMYLASE in the last 168 hours. No results for input(s): AMMONIA in the last 168 hours. Coagulation Profile: No results for input(s): INR, PROTIME in the last 168 hours. Cardiac Enzymes: No results for input(s): CKTOTAL, CKMB, CKMBINDEX, TROPONINI in the last 168 hours. BNP (last 3 results) No results for input(s): PROBNP in the last 8760 hours. HbA1C: No results for input(s): HGBA1C in the last 72 hours.  CBG: Recent Labs  Lab 03/20/21 0917 03/20/21 1110 03/20/21 1624 03/20/21 2047 03/21/21 0824  GLUCAP 229* 209* 293*  326* 247*    Lipid Profile: No results for input(s): CHOL, HDL, LDLCALC, TRIG, CHOLHDL, LDLDIRECT in the last 72 hours. Thyroid Function Tests: No results for input(s): TSH, T4TOTAL, FREET4, T3FREE, THYROIDAB in the last 72 hours. Anemia Panel: No results for input(s): VITAMINB12, FOLATE, FERRITIN, TIBC, IRON, RETICCTPCT in the last 72 hours. Sepsis Labs: No results for input(s): PROCALCITON, LATICACIDVEN in the last 168 hours.  Recent Results (from the past 240 hour(s))  Blood culture (routine x 2)     Status: None   Collection Time: 03/14/21 12:40 AM   Specimen: BLOOD  Result Value Ref Range Status   Specimen Description   Final    BLOOD RIGHT ANTECUBITAL Performed at [facility], [address], [place], [address] [place]    Special Requests   Final    BOTTLES DRAWN AEROBIC AND ANAEROBIC Blood Culture adequate volume Performed at [facility], [address] [place], [address] [place]    Culture   Final  NO GROWTH 5 DAYS Performed at [facility], [address], [place], [place] [address]    Report Status 03/19/2021 FINAL  Final  Blood culture (routine x 2)     Status: None   Collection Time: 03/14/21  1:10 AM   Specimen: BLOOD LEFT FOREARM  Result Value Ref Range Status   Specimen Description   Final    BLOOD LEFT FOREARM Performed at [facility], [address], [place], [place] [address]    Special Requests   Final    BOTTLES DRAWN AEROBIC AND ANAEROBIC Blood Culture adequate volume Performed at [facility], [address], [place], [place] [address]    Culture   Final    NO GROWTH 5 DAYS Performed at [facility], [address] [place], [place] [address]    Report Status 03/19/2021 FINAL  Final  Resp Panel by RT-PCR (Flu A&B, Covid) Nasopharyngeal Swab     Status: None   Collection Time: 03/14/21  1:10 AM   Specimen: Nasopharyngeal Swab; Nasopharyngeal(NP) swabs in vial  transport medium  Result Value Ref Range Status   SARS Coronavirus 2 by RT PCR NEGATIVE NEGATIVE Final    Comment: (NOTE) SARS-CoV-2 target nucleic acids are NOT DETECTED.  The SARS-CoV-2 RNA is generally detectable in upper respiratory specimens during the acute phase of infection. The lowest concentration of SARS-CoV-2 viral copies this assay can detect is 138 copies/mL. A negative result does not preclude SARS-Cov-2 infection and should not be used as the sole basis for treatment or other patient management decisions. A negative result may occur with  improper specimen collection/handling, submission of specimen other than nasopharyngeal swab, presence of viral mutation(s) within the areas targeted by this assay, and inadequate number of viral copies(<138 copies/mL). A negative result must be combined with clinical observations, patient history, and epidemiological information. The expected result is Negative.  Fact Sheet for Patients:  BloggerCourse.com  Fact Sheet for Healthcare Providers:  SeriousBroker.it  This test is no t yet approved or cleared by the [place] FDA and  has been authorized for detection and/or diagnosis of SARS-CoV-2 by FDA under an Emergency Use Authorization (EUA). This EUA will remain  in effect (meaning this test can be used) for the duration of the COVID-19 declaration under Section 564(b)(1) of the Act, 21 U.S.C.section 360bbb-3(b)(1), unless the authorization is terminated  or revoked sooner.       Influenza A by PCR NEGATIVE NEGATIVE Final   Influenza B by PCR NEGATIVE NEGATIVE Final    Comment: (NOTE) The Xpert Xpress SARS-CoV-2/FLU/RSV plus assay is intended as an aid in the diagnosis of influenza from Nasopharyngeal swab specimens and should not be used as a sole basis for treatment. Nasal washings and aspirates are unacceptable for Xpert Xpress SARS-CoV-2/FLU/RSV testing.  Fact  Sheet for Patients: BloggerCourse.com  Fact Sheet for Healthcare Providers: SeriousBroker.it  This test is not yet approved or cleared by the [place] FDA and has been authorized for detection and/or diagnosis of SARS-CoV-2 by FDA under an Emergency Use Authorization (EUA). This EUA will remain in effect (meaning this test can be used) for the duration of the COVID-19 declaration under Section 564(b)(1) of the Act, 21 U.S.C. section 360bbb-3(b)(1), unless the authorization is terminated or revoked.  Performed at [facility], [address] [place], [place] [address]   MRSA Next Gen by PCR, Nasal     Status: None   Collection Time: 03/15/21  4:30 AM  Specimen: Nasal Mucosa; Nasal Swab  Result Value Ref Range Status   MRSA by PCR Next Gen NOT DETECTED NOT DETECTED Final    Comment: (NOTE) The GeneXpert MRSA Assay (FDA approved for NASAL specimens only), is one component of a comprehensive MRSA colonization surveillance program. It is not intended to diagnose MRSA infection nor to guide or monitor treatment for MRSA infections. Test performance is not FDA approved in patients less than 26 years old. Performed at [facility], [address], [place], [place] [address]   Aerobic/Anaerobic Culture w Gram Stain (surgical/deep wound)     Status: None (Preliminary result)   Collection Time: 03/16/21 10:53 AM   Specimen: PATH Soft tissue resection  Result Value Ref Range Status   Specimen Description   Final    TISSUE LEFT FOOT Performed at [facility], [address] [place], [place] [address]    Special Requests   Final    NONE Performed at [facility], [address], [place], [place] [address]    Gram Stain   Final    FEW WBC PRESENT,BOTH PMN AND MONONUCLEAR NO ORGANISMS SEEN    Culture   Final    RARE STAPHYLOCOCCUS EPIDERMIDIS CRITICAL  VALUE NOTED.  VALUE IS CONSISTENT WITH PREVIOUSLY REPORTED AND CALLED VALUE. Performed at [facility], [address], [place], [place] [address]    Report Status PENDING  Incomplete   Organism ID, Bacteria STAPHYLOCOCCUS EPIDERMIDIS  Final      Susceptibility   Staphylococcus epidermidis - MIC*    CIPROFLOXACIN <=0.5 SENSITIVE Sensitive     ERYTHROMYCIN >=8 RESISTANT Resistant     GENTAMICIN <=0.5 SENSITIVE Sensitive     OXACILLIN >=4 RESISTANT Resistant     TETRACYCLINE >=16 RESISTANT Resistant     VANCOMYCIN 2 SENSITIVE Sensitive     TRIMETH/SULFA 160 RESISTANT Resistant     CLINDAMYCIN >=8 RESISTANT Resistant     RIFAMPIN <=0.5 SENSITIVE Sensitive     Inducible Clindamycin NEGATIVE Sensitive     * RARE STAPHYLOCOCCUS EPIDERMIDIS  Aerobic/Anaerobic Culture w Gram Stain (surgical/deep wound)     Status: None (Preliminary result)   Collection Time: 03/16/21 11:12 AM   Specimen: PATH Soft tissue resection  Result Value Ref Range Status   Specimen Description   Final    FOOT LEFT BONE Performed at [facility], [address], [place], [place] [address]    Special Requests   Final    NONE Performed at [facility], [address], [place], [place] [address]    Gram Stain   Final    WBC PRESENT, PREDOMINANTLY MONONUCLEAR NO ORGANISMS SEEN    Culture   Final    FEW STAPHYLOCOCCUS EPIDERMIDIS NO ANAEROBES ISOLATED; CULTURE IN PROGRESS FOR 5 DAYS CRITICAL RESULT CALLED TO, READ BACK BY AND VERIFIED WITH: DR PRICE [id number] FCP Performed at [facility], [address], [place], [place] [address]    Report Status PENDING  Incomplete   Organism ID, Bacteria STAPHYLOCOCCUS EPIDERMIDIS  Final      Susceptibility   Staphylococcus epidermidis - MIC*    CIPROFLOXACIN <=0.5 SENSITIVE Sensitive     ERYTHROMYCIN >=8 RESISTANT Resistant     GENTAMICIN <=0.5 SENSITIVE Sensitive     OXACILLIN >=4 RESISTANT Resistant     TETRACYCLINE  >=16 RESISTANT Resistant     VANCOMYCIN 2 SENSITIVE Sensitive     TRIMETH/SULFA 160 RESISTANT Resistant     CLINDAMYCIN >=  8 RESISTANT Resistant     RIFAMPIN <=0.5 SENSITIVE Sensitive     Inducible Clindamycin NEGATIVE Sensitive     * FEW STAPHYLOCOCCUS EPIDERMIDIS   Radiology Studies: No results found.  Scheduled Meds:  atorvastatin  80 mg Oral Daily   cephALEXin  250 mg Oral Q6H   enoxaparin (LOVENOX) injection  40 mg Subcutaneous Q24H   insulin aspart  0-15 Units Subcutaneous TID WC   insulin aspart  0-5 Units Subcutaneous QHS   insulin glargine-yfgn  8 Units Subcutaneous Daily   metoprolol succinate  50 mg Oral Daily   Continuous Infusions:  sodium chloride 10 mL/hr at 03/21/21 0916   lactated ringers 50 mL/hr at 03/19/21 1608     LOS: 7 days    Time spent:   Azucena Fallen, DO [facility]  If 7PM-7AM, please contact night-coverage [email or web address]  03/21/2021, 11:52 AM

## 2021-03-21 NOTE — Plan of Care (Signed)

## 2021-03-22 ENCOUNTER — Telehealth: Payer: Self-pay | Admitting: Podiatry

## 2021-03-22 ENCOUNTER — Telehealth: Payer: Self-pay | Admitting: *Deleted

## 2021-03-22 DIAGNOSIS — L97529 Non-pressure chronic ulcer of other part of left foot with unspecified severity: Secondary | ICD-10-CM | POA: Diagnosis not present

## 2021-03-22 DIAGNOSIS — E11621 Type 2 diabetes mellitus with foot ulcer: Secondary | ICD-10-CM | POA: Diagnosis not present

## 2021-03-22 LAB — GLUCOSE, CAPILLARY
Glucose-Capillary: 229 mg/dL — ABNORMAL HIGH (ref 70–99)
Glucose-Capillary: 280 mg/dL — ABNORMAL HIGH (ref 70–99)
Glucose-Capillary: 311 mg/dL — ABNORMAL HIGH (ref 70–99)

## 2021-03-22 MED ORDER — CIPROFLOXACIN HCL 500 MG PO TABS: 500.0000 mg | ORAL_TABLET | Freq: Two times a day (BID) | ORAL | 0 refills | Status: AC

## 2021-03-22 NOTE — Progress Notes (Signed)
  Subjective:  Patient ID: [name], male    DOB: November 24, 1955,  MRN: [id number]  Patient seen bedside. Pain controlled. He feels anxious about ready to go home. He denies any other complaints  Objective:   Vitals:   03/22/21 0359 03/22/21 1236  BP: 132/76 (!) 146/79  Pulse: 74 78  Resp:  18  Temp: 98.1 F (36.7 C) 98.2 F (36.8 C)  SpO2: 97% 100%   General AA&O x3. Normal mood and affect.  Vascular Dorsalis pedis and posterior tibial pulses 2/4 bilat. Brisk capillary refill to all digits. Pedal hair present.  Neurologic Epicritic sensation grossly absent.  Dermatologic Wound healing reasonably well. No exposed bone. No skin tenting. No continued erythema or warmth  Orthopedic: MMT 5/5 in dorsiflexion, plantarflexion, inversion, and eversion.    Results for orders placed or performed during the hospital encounter of 03/14/21 (from the past 24 hour(s))  Glucose, capillary     Status: Abnormal   Collection Time: 03/21/21  4:25 PM  Result Value Ref Range   Glucose-Capillary 319 (H) 70 - 99 mg/dL  Glucose, capillary     Status: Abnormal   Collection Time: 03/21/21  8:50 PM  Result Value Ref Range   Glucose-Capillary 286 (H) 70 - 99 mg/dL  Glucose, capillary     Status: Abnormal   Collection Time: 03/22/21  7:14 AM  Result Value Ref Range   Glucose-Capillary 229 (H) 70 - 99 mg/dL  Glucose, capillary     Status: Abnormal   Collection Time: 03/22/21 11:27 AM  Result Value Ref Range   Glucose-Capillary 280 (H) 70 - 99 mg/dL   Assessment & Plan:  Patient was evaluated and treated and all questions answered.  Left foot ulceration, osteomyelitis in setting of acute Charcot -Labs and culture reviewed -Dressing intact today without strikethrough - left intact.  -Continue Vac change MWF -Believed surgical cure of OM given full resection of infected bone. Plan for PO Abx at discharge x2 weeks - G+ coverage. -Pending VAC approval -Pending HHC for help with changing VAC -Order  knee scooter for safe NWB at home -[facility] for discharge once needs met  Felipa Furnace, DPM  Accessible via secure chat for questions or concerns.

## 2021-03-22 NOTE — TOC Transition Note (Signed)
Transition of Care [facility]) - CM/SW Discharge Note  Patient Details  Name: [name] MRN: [id number] Date of Birth: [birth year]  Transition of Care [facility]) CM/SW Contact:  Ewing Schlein, LCSW Phone Number: 03/22/2021, 3:57 PM  Clinical Narrative: Orders for [employer] wound vac faxed to [employer] [facility] [phone]). Patient's wife repeatedly called [facility] department stating, "it's an emergency" regarding patient's wound vac. CSW spoke with patient and explained that the wound vac orders were signed on a Saturday and [facility] cannot be set up on the weekend, so the referral was not made until today. CSW confirmed with [facility] that the insurance approval for the vac was in process.  CSW spoke with patient and wife again. Wife asked about copays for [facility] which she asked TOC about on 10/9. CSW explained that the [facility] agency is the only one that can answer that question as copays are dependent on the insurance plan.   CSW confirmed with [facility] that the wound vac should be approved today. CSW updated RN. TOC signing off.  Final next level of care: Home w Home Health Services Barriers to Discharge: Barriers Resolved  Patient Goals and CMS Choice Patient states their goals for this hospitalization and ongoing recovery are:: to go back home CMS Medicare.gov Compare Post Acute Care list provided to:: Patient Choice offered to / list presented to : Patient  Discharge Plan and Services Discharge Planning Services: CM Consult Post Acute Care Choice: Durable Medical Equipment, Home Health          DME Arranged: Walker rolling, Tub bench DME Agency: [employer] Date DME Agency Contacted: 03/17/21 Time DME Agency Contacted: [phone] Representative spoke with at DME Agency: zack blanki HH Arranged: PT, RN HH Agency: [facility] Date [facility] Agency Contacted: 03/21/21 Representative spoke with at [facility] Agency: Kandee Keen  Readmission Risk Interventions No flowsheet data found.

## 2021-03-22 NOTE — Progress Notes (Signed)
Pt wound VAC started beeping showing low pressure. Attempted to reinforce the seal and changed the tubing but it didn't work. Wound vac turned off. Attending notified. Will continue to monitor.

## 2021-03-22 NOTE — Progress Notes (Signed)
Discharged home with family and all supplies via W/C  at this time. Discharge instructions given by Kaylyn Layer RN.

## 2021-03-22 NOTE — Telephone Encounter (Signed)
Patient is wanting to have provider call to authorize a wound vac from [facility] [phone]) to give the UM case #. Patient would like to release today and can not without the vac. Please advise.

## 2021-03-22 NOTE — Telephone Encounter (Signed)
Patient called to notify office that his wound vac alarm went off at 3:30 am this morning and has been off ever since. He had surgery with Dr Samuella Cota on Friday. Please advise?  Thank you

## 2021-03-22 NOTE — Consult Note (Signed)
WOC Nurse Consult Note: Reason for Consult:NPWT (VAC) dressing change to left medial mallelous surgical site. Debridement and navicular resection.   Dressing was changed yesterday due to alarm.  Has started to alarm this AM.  Device has been off for several hours, he reports.    Wound type: Surgical Pressure Injury POA: NA Measurement: 1.4 cm x 8 cm, nonapproximated wound edges with sutures in place. , but separated.  WOund bed with yellow devitalized tissue present.  Oozing tan drainage.  Wound bed: See above Drainage (amount, consistency, odor) See above  no odor Periwound:maceration to wound edges  This area is protected with skin prep and barrier ring.  Dressing procedure/placement/frequency: Cleanse wound with NS and pat dry.  Protect periwound with skin prep and barrier ring.  1 piece black foam to wound bed and 1 piece to bridge to dorsal foot.  Drape on intact skin for bridge.  Covered with drape and seal is immediately achieved.  No leaks/alarms and seal check is in "green" with no gurgling present.  *I inform patient that if he loses seal on VAC, he needs to remove dressing and place NS moist gauze until healthcare provide [facility]) can come out.  HE states his wife is unable to help with wound care for this.  I told him HH will likely teach him how to do it if there is no one else.  HH is not able to come out any time night or day if a leak occurs. VAC dressing should not stay on without suction for more than an hour. He verbalizes understanding.  Patient discharging today.  Waiting on home unit to arrive.  Will not follow at this time.  Please re-consult if needed.  Maple Hudson MSN, RN, FNP-BC CWON Wound, Ostomy, Continence Nurse Pager [phone]

## 2021-03-22 NOTE — Progress Notes (Signed)
Inpatient Diabetes Program Recommendations  AACE/ADA: New Consensus Statement on Inpatient Glycemic Control (2015)  Target Ranges:  Prepandial:   less than 140 mg/dL      Peak postprandial:   less than 180 mg/dL (1-2 hours)      Critically ill patients:  140 - 180 mg/dL   Lab Results  Component Value Date   GLUCAP 229 (H) 03/22/2021   HGBA1C 8.2 (H) 03/15/2021     Review of Glycemic Control Results for [name], [name] "MILT" (MRN [id number]) as of 03/22/2021 10:02  Ref. Range 03/21/2021 08:24 03/21/2021 11:51 03/21/2021 16:25 03/21/2021 20:50 03/22/2021 07:14  Glucose-Capillary Latest Ref Range: 70 - 99 mg/dL 975 (H) 883 (H) 254 (H) 286 (H) 229 (H)   Diabetes history: DM 2 Outpatient Diabetes medications: Lantus 25 units bid, Metformin 850 mg bid, Januvia 100 mg Daily Current orders for Inpatient glycemic control:  Semglee 8 units Daily Novolog 0-15 units tid + hs  A1c 8.2% on 10/3  Inpatient Diabetes Program Recommendations:    -  Increase Semglee to 15 units - May also benefit from Novolog 3 units tid meal coverage if eating >50% of meals.  Thanks,  Christena Deem RN, MSN, BC-ADM Inpatient Diabetes Coordinator Team Pager [phone] (8a-5p)

## 2021-03-23 ENCOUNTER — Ambulatory Visit (INDEPENDENT_AMBULATORY_CARE_PROVIDER_SITE_OTHER): Payer: BC Managed Care – PPO | Admitting: Sports Medicine

## 2021-03-23 DIAGNOSIS — M86172 Other acute osteomyelitis, left ankle and foot: Secondary | ICD-10-CM

## 2021-03-23 DIAGNOSIS — I739 Peripheral vascular disease, unspecified: Secondary | ICD-10-CM

## 2021-03-23 DIAGNOSIS — L97403 Non-pressure chronic ulcer of unspecified heel and midfoot with necrosis of muscle: Secondary | ICD-10-CM

## 2021-03-23 DIAGNOSIS — M14672 Charcot's joint, left ankle and foot: Secondary | ICD-10-CM

## 2021-03-23 DIAGNOSIS — E08621 Diabetes mellitus due to underlying condition with foot ulcer: Secondary | ICD-10-CM

## 2021-03-23 NOTE — Telephone Encounter (Signed)
Patient is calling concerning his wound vac, alarm going off, unsure what to do. Ambulance was at his house @ 11:22pm,didn't know what to do either. He went to hospital this morning and the wound vac is off, is that ok?Please advise.

## 2021-03-23 NOTE — Progress Notes (Signed)
Subjective: [name] is a 65 y.o. male patient seen in office for evaluation of wound vac states that it is saying blockage. Patient is s/p Left foot debridement with removal of bone and vac placed by Dr. Samuella Cota. Patient denies n/v/f/c/sob/pain.  Patient Active Problem List   Diagnosis Date Noted   Osteomyelitis (HCC) 03/14/2021   Diabetic foot ulcer (HCC) 03/14/2021   S/P CABG x 3 12/26/2017   Coronary artery disease 12/11/2017   GERD (gastroesophageal reflux disease) 12/04/2017   Controlled type 2 diabetes mellitus with neuropathy (HCC) 12/04/2017   Hypercholesterolemia 12/04/2017   Abnormal EKG 12/04/2017   Hypertension 12/04/2017   Current Outpatient Medications on File Prior to Visit  Medication Sig Dispense Refill   acetaminophen (TYLENOL) 500 MG tablet Take 2 tablets (1,000 mg total) by mouth every 6 (six) hours as needed for mild pain or fever. 30 tablet 0   aspirin EC 81 MG tablet Take 81 mg by mouth daily.     atorvastatin (LIPITOR) 80 MG tablet Take 80 mg by mouth daily.     calcium carbonate (TUMS - DOSED IN MG ELEMENTAL CALCIUM) 500 MG chewable tablet Chew 1-2 tablets by mouth daily as needed for indigestion or heartburn.     ciprofloxacin (CIPRO) 500 MG tablet Take 1 tablet (500 mg total) by mouth 2 (two) times daily for 12 days. 24 tablet 0   Collagenase (SANTYL EX) Apply 1 application topically daily. With wound dressings     ibuprofen (ADVIL) 200 MG tablet Take 400 mg by mouth every 6 (six) hours as needed for mild pain.     LANTUS 100 UNIT/ML injection Inject 25 Units into the skin 2 (two) times daily.     lisinopril (ZESTRIL) 10 MG tablet Take 10 mg by mouth daily.     metFORMIN (GLUCOPHAGE) 850 MG tablet Take 850 mg by mouth 3 (three) times daily.     metoprolol succinate (TOPROL-XL) 50 MG 24 hr tablet Take 1 tablet (50 mg total) by mouth daily. Take with or immediately following a meal. 90 tablet 2   oxyCODONE (OXY IR/ROXICODONE) 5 MG immediate release tablet  Take 1-2 tablets (5-10 mg total) by mouth every 6 (six) hours as needed for up to 5 days for moderate pain or severe pain. 40 tablet 0   sitaGLIPtin (JANUVIA) 100 MG tablet Take 100 mg by mouth every morning.      No current facility-administered medications on file prior to visit.   Allergies  Allergen Reactions   Codeine Sulfate [Codeine]     UNSPECIFIED REACTION     Recent Results (from the past 2160 hour(s))  CBC with Differential     Status: Abnormal   Collection Time: 03/14/21 12:40 AM  Result Value Ref Range   WBC 11.9 (H) 4.0 - 10.5 K/uL   RBC 3.83 (L) 4.22 - 5.81 MIL/uL   Hemoglobin 11.8 (L) 13.0 - 17.0 g/dL   HCT 63.8 (L) 75.6 - 43.3 %   MCV 93.7 80.0 - 100.0 fL   MCH 30.8 26.0 - 34.0 pg   MCHC 32.9 30.0 - 36.0 g/dL   RDW 29.5 18.8 - 41.6 %   Platelets 263 150 - 400 K/uL   nRBC 0.0 0.0 - 0.2 %   Neutrophils Relative % 76 %   Neutro Abs 9.1 (H) 1.7 - 7.7 K/uL   Lymphocytes Relative 12 %   Lymphs Abs 1.4 0.7 - 4.0 K/uL   Monocytes Relative 10 %   Monocytes Absolute 1.2 (H)  0.1 - 1.0 K/uL   Eosinophils Relative 1 %   Eosinophils Absolute 0.1 0.0 - 0.5 K/uL   Basophils Relative 0 %   Basophils Absolute 0.0 0.0 - 0.1 K/uL   Immature Granulocytes 1 %   Abs Immature Granulocytes 0.06 0.00 - 0.07 K/uL    Comment: Performed at [facility], [address] [place], [place] [address]  Basic metabolic panel     Status: Abnormal   Collection Time: 03/14/21 12:40 AM  Result Value Ref Range   Sodium 133 (L) 135 - 145 mmol/L   Potassium 4.3 3.5 - 5.1 mmol/L   Chloride 101 98 - 111 mmol/L   CO2 24 22 - 32 mmol/L   Glucose, Bld 238 (H) 70 - 99 mg/dL    Comment: Glucose reference range applies only to samples taken after fasting for at least 8 hours.   BUN 36 (H) 8 - 23 mg/dL   Creatinine, Ser 8.31 (H) 0.61 - 1.24 mg/dL   Calcium 8.6 (L) 8.9 - 10.3 mg/dL   GFR, Estimated >51 >76 mL/min    Comment: (NOTE) Calculated using the CKD-EPI Creatinine Equation  (2021)    Anion gap 8 5 - 15    Comment: Performed at [facility], [address], [place], [place] [address]  Blood culture (routine x 2)     Status: None   Collection Time: 03/14/21 12:40 AM   Specimen: BLOOD  Result Value Ref Range   Specimen Description      BLOOD RIGHT ANTECUBITAL Performed at [facility], [address] [place], [place] [address]    Special Requests      BOTTLES DRAWN AEROBIC AND ANAEROBIC Blood Culture adequate volume Performed at [facility], [address], [place], [place] [address]    Culture      NO GROWTH 5 DAYS Performed at [facility], [address], [place], [place] [address]    Report Status 03/19/2021 FINAL   Sedimentation rate     Status: Abnormal   Collection Time: 03/14/21 12:40 AM  Result Value Ref Range   Sed Rate 50 (H) 0 - 16 mm/hr    Comment: Performed at [facility], [address] [place], [place] [address]  C-reactive protein     Status: Abnormal   Collection Time: 03/14/21 12:40 AM  Result Value Ref Range   CRP 6.0 (H) <1.0 mg/dL    Comment: Performed at [facility], [address], [place], [place] [address]  Blood culture (routine x 2)     Status: None   Collection Time: 03/14/21  1:10 AM   Specimen: BLOOD LEFT FOREARM  Result Value Ref Range   Specimen Description      BLOOD LEFT FOREARM Performed at [facility], [address] [place], [place] [address]    Special Requests      BOTTLES DRAWN AEROBIC AND ANAEROBIC Blood Culture adequate volume Performed at [facility], [address], [place], [place] [address]    Culture      NO GROWTH 5 DAYS Performed at [facility], [address] [place], [place] [address]    Report Status 03/19/2021 FINAL   Resp Panel by RT-PCR (Flu A&B, Covid) Nasopharyngeal Swab     Status: None   Collection Time: 03/14/21  1:10 AM   Specimen: Nasopharyngeal  Swab; Nasopharyngeal(NP) swabs in vial transport medium  Result Value Ref Range   SARS Coronavirus 2 by  RT PCR NEGATIVE NEGATIVE    Comment: (NOTE) SARS-CoV-2 target nucleic acids are NOT DETECTED.  The SARS-CoV-2 RNA is generally detectable in upper respiratory specimens during the acute phase of infection. The lowest concentration of SARS-CoV-2 viral copies this assay can detect is 138 copies/mL. A negative result does not preclude SARS-Cov-2 infection and should not be used as the sole basis for treatment or other patient management decisions. A negative result may occur with  improper specimen collection/handling, submission of specimen other than nasopharyngeal swab, presence of viral mutation(s) within the areas targeted by this assay, and inadequate number of viral copies(<138 copies/mL). A negative result must be combined with clinical observations, patient history, and epidemiological information. The expected result is Negative.  Fact Sheet for Patients:  BloggerCourse.com  Fact Sheet for Healthcare Providers:  SeriousBroker.it  This test is no t yet approved or cleared by the [place] FDA and  has been authorized for detection and/or diagnosis of SARS-CoV-2 by FDA under an Emergency Use Authorization (EUA). This EUA will remain  in effect (meaning this test can be used) for the duration of the COVID-19 declaration under Section 564(b)(1) of the Act, 21 U.S.C.section 360bbb-3(b)(1), unless the authorization is terminated  or revoked sooner.       Influenza A by PCR NEGATIVE NEGATIVE   Influenza B by PCR NEGATIVE NEGATIVE    Comment: (NOTE) The Xpert Xpress SARS-CoV-2/FLU/RSV plus assay is intended as an aid in the diagnosis of influenza from Nasopharyngeal swab specimens and should not be used as a sole basis for treatment. Nasal washings and aspirates are unacceptable for Xpert Xpress  SARS-CoV-2/FLU/RSV testing.  Fact Sheet for Patients: BloggerCourse.com  Fact Sheet for Healthcare Providers: SeriousBroker.it  This test is not yet approved or cleared by the [place] FDA and has been authorized for detection and/or diagnosis of SARS-CoV-2 by FDA under an Emergency Use Authorization (EUA). This EUA will remain in effect (meaning this test can be used) for the duration of the COVID-19 declaration under Section 564(b)(1) of the Act, 21 U.S.C. section 360bbb-3(b)(1), unless the authorization is terminated or revoked.  Performed at [facility], [address] [place], [place] [address]   Glucose, capillary     Status: Abnormal   Collection Time: 03/14/21 11:17 AM  Result Value Ref Range   Glucose-Capillary 145 (H) 70 - 99 mg/dL    Comment: Glucose reference range applies only to samples taken after fasting for at least 8 hours.  Glucose, capillary     Status: Abnormal   Collection Time: 03/14/21  4:58 PM  Result Value Ref Range   Glucose-Capillary 214 (H) 70 - 99 mg/dL    Comment: Glucose reference range applies only to samples taken after fasting for at least 8 hours.  Glucose, capillary     Status: Abnormal   Collection Time: 03/14/21  9:48 PM  Result Value Ref Range   Glucose-Capillary 176 (H) 70 - 99 mg/dL    Comment: Glucose reference range applies only to samples taken after fasting for at least 8 hours.  Hemoglobin A1c     Status: Abnormal   Collection Time: 03/15/21  3:32 AM  Result Value Ref Range   Hgb A1c MFr Bld 8.2 (H) 4.8 - 5.6 %    Comment: (NOTE) Pre diabetes:          5.7%-6.4%  Diabetes:              >6.4%  Glycemic control for   <7.0% adults with  diabetes    Mean Plasma Glucose 188.64 mg/dL    Comment: Performed at [facility], [address] [place], [place] [address]  HIV Antibody (routine testing w rflx)     Status: None   Collection Time: 03/15/21   3:32 AM  Result Value Ref Range   HIV Screen 4th Generation wRfx Non Reactive Non Reactive    Comment: Performed at [facility], [address], [place], [place] [address]  Comprehensive metabolic panel     Status: Abnormal   Collection Time: 03/15/21  3:32 AM  Result Value Ref Range   Sodium 135 135 - 145 mmol/L   Potassium 4.1 3.5 - 5.1 mmol/L   Chloride 107 98 - 111 mmol/L   CO2 23 22 - 32 mmol/L   Glucose, Bld 177 (H) 70 - 99 mg/dL    Comment: Glucose reference range applies only to samples taken after fasting for at least 8 hours.   BUN 22 8 - 23 mg/dL   Creatinine, Ser 0.98 0.61 - 1.24 mg/dL   Calcium 8.1 (L) 8.9 - 10.3 mg/dL   Total Protein 5.7 (L) 6.5 - 8.1 g/dL   Albumin 2.7 (L) 3.5 - 5.0 g/dL   AST 12 (L) 15 - 41 U/L   ALT 15 0 - 44 U/L   Alkaline Phosphatase 60 38 - 126 U/L   Total Bilirubin 0.7 0.3 - 1.2 mg/dL   GFR, Estimated >11 >91 mL/min    Comment: (NOTE) Calculated using the CKD-EPI Creatinine Equation (2021)    Anion gap 5 5 - 15    Comment: Performed at [facility], [address], [place], [place] [address]  CBC     Status: Abnormal   Collection Time: 03/15/21  3:32 AM  Result Value Ref Range   WBC 10.1 4.0 - 10.5 K/uL   RBC 3.43 (L) 4.22 - 5.81 MIL/uL   Hemoglobin 10.6 (L) 13.0 - 17.0 g/dL   HCT 56.2 (L) 13.0 - 86.5 %   MCV 95.3 80.0 - 100.0 fL   MCH 30.9 26.0 - 34.0 pg   MCHC 32.4 30.0 - 36.0 g/dL   RDW 78.4 69.6 - 29.5 %   Platelets 209 150 - 400 K/uL   nRBC 0.0 0.0 - 0.2 %    Comment: Performed at [facility], [address] [place], [place] [address]  MRSA Next Gen by PCR, Nasal     Status: None   Collection Time: 03/15/21  4:30 AM   Specimen: Nasal Mucosa; Nasal Swab  Result Value Ref Range   MRSA by PCR Next Gen NOT DETECTED NOT DETECTED    Comment: (NOTE) The GeneXpert MRSA Assay (FDA approved for NASAL specimens only), is one component of a comprehensive MRSA colonization  surveillance program. It is not intended to diagnose MRSA infection nor to guide or monitor treatment for MRSA infections. Test performance is not FDA approved in patients less than 77 years old. Performed at [facility], [address], [place], [place] [address]   Glucose, capillary     Status: Abnormal   Collection Time: 03/15/21  7:22 AM  Result Value Ref Range   Glucose-Capillary 157 (H) 70 - 99 mg/dL    Comment: Glucose reference range applies only to samples taken after fasting for at least 8 hours.  Glucose, capillary     Status: Abnormal   Collection Time: 03/15/21 11:32 AM  Result Value Ref Range   Glucose-Capillary 274 (  H) 70 - 99 mg/dL    Comment: Glucose reference range applies only to samples taken after fasting for at least 8 hours.  Glucose, capillary     Status: Abnormal   Collection Time: 03/15/21  4:26 PM  Result Value Ref Range   Glucose-Capillary 171 (H) 70 - 99 mg/dL    Comment: Glucose reference range applies only to samples taken after fasting for at least 8 hours.  Glucose, capillary     Status: Abnormal   Collection Time: 03/15/21 10:22 PM  Result Value Ref Range   Glucose-Capillary 374 (H) 70 - 99 mg/dL    Comment: Glucose reference range applies only to samples taken after fasting for at least 8 hours.  CBC     Status: Abnormal   Collection Time: 03/16/21  3:32 AM  Result Value Ref Range   WBC 11.1 (H) 4.0 - 10.5 K/uL   RBC 4.07 (L) 4.22 - 5.81 MIL/uL   Hemoglobin 12.6 (L) 13.0 - 17.0 g/dL   HCT 26.3 33.5 - 45.6 %   MCV 96.3 80.0 - 100.0 fL   MCH 31.0 26.0 - 34.0 pg   MCHC 32.1 30.0 - 36.0 g/dL   RDW 25.6 38.9 - 37.3 %   Platelets 253 150 - 400 K/uL   nRBC 0.0 0.0 - 0.2 %    Comment: Performed at [facility], [address] [place], [place] [address]  Basic metabolic panel     Status: Abnormal   Collection Time: 03/16/21  3:32 AM  Result Value Ref Range   Sodium 138 135 - 145 mmol/L   Potassium 4.8 3.5 -  5.1 mmol/L   Chloride 104 98 - 111 mmol/L   CO2 25 22 - 32 mmol/L   Glucose, Bld 237 (H) 70 - 99 mg/dL    Comment: Glucose reference range applies only to samples taken after fasting for at least 8 hours.   BUN 22 8 - 23 mg/dL   Creatinine, Ser 8.11 0.61 - 1.24 mg/dL   Calcium 9.0 8.9 - 57.2 mg/dL   GFR, Estimated >62 >03 mL/min    Comment: (NOTE) Calculated using the CKD-EPI Creatinine Equation (2021)    Anion gap 9 5 - 15    Comment: Performed at [facility], [address], [place], [place] [address]  Glucose, capillary     Status: Abnormal   Collection Time: 03/16/21  7:29 AM  Result Value Ref Range   Glucose-Capillary 206 (H) 70 - 99 mg/dL    Comment: Glucose reference range applies only to samples taken after fasting for at least 8 hours.  Glucose, capillary     Status: Abnormal   Collection Time: 03/16/21  9:14 AM  Result Value Ref Range   Glucose-Capillary 221 (H) 70 - 99 mg/dL    Comment: Glucose reference range applies only to samples taken after fasting for at least 8 hours.  Aerobic/Anaerobic Culture w Gram Stain (surgical/deep wound)     Status: None   Collection Time: 03/16/21 10:53 AM   Specimen: PATH Soft tissue resection  Result Value Ref Range   Specimen Description      TISSUE LEFT FOOT Performed at [facility], [address], [place], [place] [address]    Special Requests      NONE Performed at [facility], [address] [place], [place] [address]    Gram Stain      FEW WBC PRESENT,BOTH PMN AND MONONUCLEAR NO ORGANISMS SEEN  Culture      RARE STAPHYLOCOCCUS EPIDERMIDIS CRITICAL VALUE NOTED.  VALUE IS CONSISTENT WITH PREVIOUSLY REPORTED AND CALLED VALUE. NO ANAEROBES ISOLATED Performed at [facility], [address], [place], [place] [address]    Report Status 03/21/2021 FINAL    Organism ID, Bacteria STAPHYLOCOCCUS EPIDERMIDIS       Susceptibility   Staphylococcus epidermidis -  MIC*    CIPROFLOXACIN <=0.5 SENSITIVE Sensitive     ERYTHROMYCIN >=8 RESISTANT Resistant     GENTAMICIN <=0.5 SENSITIVE Sensitive     OXACILLIN >=4 RESISTANT Resistant     TETRACYCLINE >=16 RESISTANT Resistant     VANCOMYCIN 2 SENSITIVE Sensitive     TRIMETH/SULFA 160 RESISTANT Resistant     CLINDAMYCIN >=8 RESISTANT Resistant     RIFAMPIN <=0.5 SENSITIVE Sensitive     Inducible Clindamycin NEGATIVE Sensitive     * RARE STAPHYLOCOCCUS EPIDERMIDIS  Surgical pathology     Status: None   Collection Time: 03/16/21 11:12 AM  Result Value Ref Range   SURGICAL PATHOLOGY      SURGICAL PATHOLOGY CASE: [id number] PATIENT: Lasandra Beech Surgical Pathology Report     Clinical History: Osteomyelitis left foot (crm)     FINAL MICROSCOPIC DIAGNOSIS:  A. BONE, LEFT FOOT, BIOPSY: - Fragments of benign bone and cartilage and marrow elements with foci of acute inflammation      GROSS DESCRIPTION:  Received fresh is a 0.6 x 0.6 x 0.5 cm irregular portion of pink-red firm bone with one aspect having a gray-white smooth articular surface. The specimen is sectioned and entirely submitted in 1 block following decalcification.  SW 03/17/2021   Final Diagnosis performed by Holley Bouche, MD.   Electronically signed 03/18/2021 Technical and / or Professional components performed at [facility], [address], [place], [place] [address].  Immunohistochemistry Technical component (if applicable) was performed at [facility]. [address], [place], [place] [address].   IMMUNOHISTOCHEMIS TRY DISCLAIMER (if applicable): Some of these immunohistochemical stains may have been developed and the performance characteristics determine by [facility]. Some may not have been cleared or approved by the U.S. Food and Drug Administration. The FDA has determined that such clearance or approval is not necessary. This test is used for  clinical purposes. It should not be regarded as investigational or for research. This laboratory is certified under the Clinical Laboratory Improvement Amendments of 1988 (CLIA-88) as qualified to perform high complexity clinical laboratory testing.  The controls stained appropriately.   Aerobic/Anaerobic Culture w Gram Stain (surgical/deep wound)     Status: None   Collection Time: 03/16/21 11:12 AM   Specimen: PATH Soft tissue resection  Result Value Ref Range   Specimen Description      FOOT LEFT BONE Performed at [facility], [address], [place], [place] [address]    Special Requests      NONE Performed at [facility], [address], [place], [place] [address]    Gram Stain      WBC PRESENT, PREDOMINANTLY MONONUCLEAR NO ORGANISMS SEEN    Culture      FEW STAPHYLOCOCCUS EPIDERMIDIS NO ANAEROBES ISOLATED CRITICAL RESULT CALLED TO, READ BACK BY AND VERIFIED WITH: DR PRICE [phone] FCP Performed at [facility], [address], [place], [place] [address]    Report Status 03/21/2021 FINAL    Organism ID, Bacteria STAPHYLOCOCCUS EPIDERMIDIS       Susceptibility   Staphylococcus epidermidis -  MIC*    CIPROFLOXACIN <=0.5 SENSITIVE Sensitive     ERYTHROMYCIN >=8 RESISTANT Resistant     GENTAMICIN <=0.5 SENSITIVE Sensitive     OXACILLIN >=4 RESISTANT Resistant     TETRACYCLINE >=16 RESISTANT Resistant     VANCOMYCIN 2 SENSITIVE Sensitive     TRIMETH/SULFA 160 RESISTANT Resistant     CLINDAMYCIN >=8 RESISTANT Resistant     RIFAMPIN <=0.5 SENSITIVE Sensitive     Inducible Clindamycin NEGATIVE Sensitive     * FEW STAPHYLOCOCCUS EPIDERMIDIS  Glucose, capillary     Status: Abnormal   Collection Time: 03/16/21 11:52 AM  Result Value Ref Range   Glucose-Capillary 151 (H) 70 - 99 mg/dL    Comment: Glucose reference range applies only to samples taken after fasting for at least 8 hours.  Glucose, capillary     Status: Abnormal    Collection Time: 03/16/21  4:51 PM  Result Value Ref Range   Glucose-Capillary 193 (H) 70 - 99 mg/dL    Comment: Glucose reference range applies only to samples taken after fasting for at least 8 hours.  Glucose, capillary     Status: Abnormal   Collection Time: 03/16/21  9:07 PM  Result Value Ref Range   Glucose-Capillary 201 (H) 70 - 99 mg/dL    Comment: Glucose reference range applies only to samples taken after fasting for at least 8 hours.  CBC     Status: Abnormal   Collection Time: 03/17/21  3:26 AM  Result Value Ref Range   WBC 8.5 4.0 - 10.5 K/uL   RBC 3.64 (L) 4.22 - 5.81 MIL/uL   Hemoglobin 11.2 (L) 13.0 - 17.0 g/dL   HCT 11.9 (L) 14.7 - 82.9 %   MCV 94.8 80.0 - 100.0 fL   MCH 30.8 26.0 - 34.0 pg   MCHC 32.5 30.0 - 36.0 g/dL   RDW 56.2 13.0 - 86.5 %   Platelets 245 150 - 400 K/uL   nRBC 0.0 0.0 - 0.2 %    Comment: Performed at [facility], [address] [place], [place] [address]  Basic metabolic panel     Status: Abnormal   Collection Time: 03/17/21  3:26 AM  Result Value Ref Range   Sodium 134 (L) 135 - 145 mmol/L   Potassium 4.3 3.5 - 5.1 mmol/L   Chloride 103 98 - 111 mmol/L   CO2 23 22 - 32 mmol/L   Glucose, Bld 116 (H) 70 - 99 mg/dL    Comment: Glucose reference range applies only to samples taken after fasting for at least 8 hours.   BUN 22 8 - 23 mg/dL   Creatinine, Ser 6.29 0.61 - 1.24 mg/dL   Calcium 8.6 (L) 8.9 - 10.3 mg/dL   GFR, Estimated >52 >84 mL/min    Comment: (NOTE) Calculated using the CKD-EPI Creatinine Equation (2021)    Anion gap 8 5 - 15    Comment: Performed at [facility], [address], [place], [place] [address]  Glucose, capillary     Status: Abnormal   Collection Time: 03/17/21  7:54 AM  Result Value Ref Range   Glucose-Capillary 131 (H) 70 - 99 mg/dL    Comment: Glucose reference range applies only to samples taken after fasting for at least 8 hours.  Glucose, capillary     Status:  Abnormal   Collection Time: 03/17/21 11:47 AM  Result Value Ref Range   Glucose-Capillary 203 (H) 70 - 99 mg/dL    Comment:  Glucose reference range applies only to samples taken after fasting for at least 8 hours.  Glucose, capillary     Status: Abnormal   Collection Time: 03/17/21  5:07 PM  Result Value Ref Range   Glucose-Capillary 177 (H) 70 - 99 mg/dL    Comment: Glucose reference range applies only to samples taken after fasting for at least 8 hours.  Glucose, capillary     Status: Abnormal   Collection Time: 03/17/21  9:24 PM  Result Value Ref Range   Glucose-Capillary 194 (H) 70 - 99 mg/dL    Comment: Glucose reference range applies only to samples taken after fasting for at least 8 hours.  CBC     Status: Abnormal   Collection Time: 03/18/21  3:25 AM  Result Value Ref Range   WBC 7.4 4.0 - 10.5 K/uL   RBC 3.74 (L) 4.22 - 5.81 MIL/uL   Hemoglobin 11.4 (L) 13.0 - 17.0 g/dL   HCT 68.1 (L) 27.5 - 17.0 %   MCV 94.4 80.0 - 100.0 fL   MCH 30.5 26.0 - 34.0 pg   MCHC 32.3 30.0 - 36.0 g/dL   RDW 01.7 49.4 - 49.6 %   Platelets 252 150 - 400 K/uL   nRBC 0.0 0.0 - 0.2 %    Comment: Performed at [facility], [address], [place], [place] [address]  Basic metabolic panel     Status: Abnormal   Collection Time: 03/18/21  3:25 AM  Result Value Ref Range   Sodium 135 135 - 145 mmol/L   Potassium 4.6 3.5 - 5.1 mmol/L   Chloride 104 98 - 111 mmol/L   CO2 26 22 - 32 mmol/L   Glucose, Bld 200 (H) 70 - 99 mg/dL    Comment: Glucose reference range applies only to samples taken after fasting for at least 8 hours.   BUN 20 8 - 23 mg/dL   Creatinine, Ser 3.84 0.61 - 1.24 mg/dL   Calcium 8.8 (L) 8.9 - 10.3 mg/dL   GFR, Estimated >66 >59 mL/min    Comment: (NOTE) Calculated using the CKD-EPI Creatinine Equation (2021)    Anion gap 5 5 - 15    Comment: Performed at [facility], [address], [place], [place] [address]  Glucose, capillary      Status: Abnormal   Collection Time: 03/18/21  7:52 AM  Result Value Ref Range   Glucose-Capillary 181 (H) 70 - 99 mg/dL    Comment: Glucose reference range applies only to samples taken after fasting for at least 8 hours.  Glucose, capillary     Status: Abnormal   Collection Time: 03/18/21 12:54 PM  Result Value Ref Range   Glucose-Capillary 240 (H) 70 - 99 mg/dL    Comment: Glucose reference range applies only to samples taken after fasting for at least 8 hours.  Glucose, capillary     Status: Abnormal   Collection Time: 03/18/21  5:02 PM  Result Value Ref Range   Glucose-Capillary 259 (H) 70 - 99 mg/dL    Comment: Glucose reference range applies only to samples taken after fasting for at least 8 hours.  Glucose, capillary     Status: Abnormal   Collection Time: 03/18/21  9:56 PM  Result Value Ref Range   Glucose-Capillary 256 (H) 70 - 99 mg/dL    Comment: Glucose reference range applies only to samples taken after fasting for at least 8 hours.  CBC     Status: Abnormal  Collection Time: 03/19/21  3:27 AM  Result Value Ref Range   WBC 9.0 4.0 - 10.5 K/uL   RBC 3.80 (L) 4.22 - 5.81 MIL/uL   Hemoglobin 11.5 (L) 13.0 - 17.0 g/dL   HCT 01.0 (L) 93.2 - 35.5 %   MCV 94.2 80.0 - 100.0 fL   MCH 30.3 26.0 - 34.0 pg   MCHC 32.1 30.0 - 36.0 g/dL   RDW 73.2 20.2 - 54.2 %   Platelets 253 150 - 400 K/uL   nRBC 0.0 0.0 - 0.2 %    Comment: Performed at [facility], [address] [place], [place] [address]  Basic metabolic panel     Status: Abnormal   Collection Time: 03/19/21  3:27 AM  Result Value Ref Range   Sodium 136 135 - 145 mmol/L   Potassium 4.6 3.5 - 5.1 mmol/L   Chloride 105 98 - 111 mmol/L   CO2 24 22 - 32 mmol/L   Glucose, Bld 202 (H) 70 - 99 mg/dL    Comment: Glucose reference range applies only to samples taken after fasting for at least 8 hours.   BUN 26 (H) 8 - 23 mg/dL   Creatinine, Ser 7.62 0.61 - 1.24 mg/dL   Calcium 8.7 (L) 8.9 - 10.3 mg/dL    GFR, Estimated >83 >15 mL/min    Comment: (NOTE) Calculated using the CKD-EPI Creatinine Equation (2021)    Anion gap 7 5 - 15    Comment: Performed at [facility], [address] [place], [place] [address]  Glucose, capillary     Status: Abnormal   Collection Time: 03/19/21  8:11 AM  Result Value Ref Range   Glucose-Capillary 196 (H) 70 - 99 mg/dL    Comment: Glucose reference range applies only to samples taken after fasting for at least 8 hours.  Glucose, capillary     Status: Abnormal   Collection Time: 03/19/21 12:02 PM  Result Value Ref Range   Glucose-Capillary 290 (H) 70 - 99 mg/dL    Comment: Glucose reference range applies only to samples taken after fasting for at least 8 hours.  Glucose, capillary     Status: Abnormal   Collection Time: 03/19/21  3:50 PM  Result Value Ref Range   Glucose-Capillary 221 (H) 70 - 99 mg/dL    Comment: Glucose reference range applies only to samples taken after fasting for at least 8 hours.  Glucose, capillary     Status: Abnormal   Collection Time: 03/19/21  5:31 PM  Result Value Ref Range   Glucose-Capillary 194 (H) 70 - 99 mg/dL    Comment: Glucose reference range applies only to samples taken after fasting for at least 8 hours.   Comment 1 Notify RN    Comment 2 Document in Chart    Comment 3 Call MD NNP PA CNM   Glucose, capillary     Status: Abnormal   Collection Time: 03/19/21  7:29 PM  Result Value Ref Range   Glucose-Capillary 183 (H) 70 - 99 mg/dL    Comment: Glucose reference range applies only to samples taken after fasting for at least 8 hours.  Glucose, capillary     Status: Abnormal   Collection Time: 03/19/21  9:24 PM  Result Value Ref Range   Glucose-Capillary 210 (H) 70 - 99 mg/dL    Comment: Glucose reference range applies only to samples taken after fasting for at least 8 hours.  CBC     Status: Abnormal  Collection Time: 03/20/21  4:53 AM  Result Value Ref Range   WBC 9.6 4.0 - 10.5 K/uL    RBC 3.72 (L) 4.22 - 5.81 MIL/uL   Hemoglobin 11.6 (L) 13.0 - 17.0 g/dL   HCT 38.4 (L) 53.6 - 46.8 %   MCV 93.5 80.0 - 100.0 fL   MCH 31.2 26.0 - 34.0 pg   MCHC 33.3 30.0 - 36.0 g/dL   RDW 03.2 12.2 - 48.2 %   Platelets 248 150 - 400 K/uL   nRBC 0.0 0.0 - 0.2 %    Comment: Performed at [facility], [address] [place], [place] [address]  Basic metabolic panel     Status: Abnormal   Collection Time: 03/20/21  4:53 AM  Result Value Ref Range   Sodium 133 (L) 135 - 145 mmol/L   Potassium 4.5 3.5 - 5.1 mmol/L   Chloride 100 98 - 111 mmol/L   CO2 25 22 - 32 mmol/L   Glucose, Bld 274 (H) 70 - 99 mg/dL    Comment: Glucose reference range applies only to samples taken after fasting for at least 8 hours.   BUN 19 8 - 23 mg/dL   Creatinine, Ser 0.48 0.61 - 1.24 mg/dL   Calcium 8.6 (L) 8.9 - 10.3 mg/dL   GFR, Estimated >88 >91 mL/min    Comment: (NOTE) Calculated using the CKD-EPI Creatinine Equation (2021)    Anion gap 8 5 - 15    Comment: Performed at [facility], [address] [place], [place] [address]  Vancomycin, peak     Status: Abnormal   Collection Time: 03/20/21  4:53 AM  Result Value Ref Range   Vancomycin Pk 44 (H) 30 - 40 ug/mL    Comment: Performed at [facility], [address] [place], [place] [address]  Glucose, capillary     Status: Abnormal   Collection Time: 03/20/21  9:17 AM  Result Value Ref Range   Glucose-Capillary 229 (H) 70 - 99 mg/dL    Comment: Glucose reference range applies only to samples taken after fasting for at least 8 hours.  Glucose, capillary     Status: Abnormal   Collection Time: 03/20/21 11:10 AM  Result Value Ref Range   Glucose-Capillary 209 (H) 70 - 99 mg/dL    Comment: Glucose reference range applies only to samples taken after fasting for at least 8 hours.  Vancomycin, trough     Status: None   Collection Time: 03/20/21  1:33 PM  Result Value Ref Range   Vancomycin Tr 20  15 - 20 ug/mL    Comment: Performed at [facility], [address] [place], [place] [address]  Glucose, capillary     Status: Abnormal   Collection Time: 03/20/21  4:24 PM  Result Value Ref Range   Glucose-Capillary 293 (H) 70 - 99 mg/dL    Comment: Glucose reference range applies only to samples taken after fasting for at least 8 hours.  Glucose, capillary     Status: Abnormal   Collection Time: 03/20/21  8:47 PM  Result Value Ref Range   Glucose-Capillary 326 (H) 70 - 99 mg/dL    Comment: Glucose reference range applies only to samples taken after fasting for at least 8 hours.  Creatinine, serum     Status: None   Collection Time: 03/21/21  3:23 AM  Result Value Ref Range   Creatinine, Ser 0.95 0.61 - 1.24 mg/dL   GFR, Estimated >  60 >60 mL/min    Comment: (NOTE) Calculated using the CKD-EPI Creatinine Equation (2021) Performed at [facility], [address] [place], [place] [address]   Glucose, capillary     Status: Abnormal   Collection Time: 03/21/21  8:24 AM  Result Value Ref Range   Glucose-Capillary 247 (H) 70 - 99 mg/dL    Comment: Glucose reference range applies only to samples taken after fasting for at least 8 hours.  Glucose, capillary     Status: Abnormal   Collection Time: 03/21/21 11:51 AM  Result Value Ref Range   Glucose-Capillary 249 (H) 70 - 99 mg/dL    Comment: Glucose reference range applies only to samples taken after fasting for at least 8 hours.  Glucose, capillary     Status: Abnormal   Collection Time: 03/21/21  4:25 PM  Result Value Ref Range   Glucose-Capillary 319 (H) 70 - 99 mg/dL    Comment: Glucose reference range applies only to samples taken after fasting for at least 8 hours.  Glucose, capillary     Status: Abnormal   Collection Time: 03/21/21  8:50 PM  Result Value Ref Range   Glucose-Capillary 286 (H) 70 - 99 mg/dL    Comment: Glucose reference range applies only to samples taken after fasting for  at least 8 hours.  Glucose, capillary     Status: Abnormal   Collection Time: 03/22/21  7:14 AM  Result Value Ref Range   Glucose-Capillary 229 (H) 70 - 99 mg/dL    Comment: Glucose reference range applies only to samples taken after fasting for at least 8 hours.  Glucose, capillary     Status: Abnormal   Collection Time: 03/22/21 11:27 AM  Result Value Ref Range   Glucose-Capillary 280 (H) 70 - 99 mg/dL    Comment: Glucose reference range applies only to samples taken after fasting for at least 8 hours.  Glucose, capillary     Status: Abnormal   Collection Time: 03/22/21  4:46 PM  Result Value Ref Range   Glucose-Capillary 311 (H) 70 - 99 mg/dL    Comment: Glucose reference range applies only to samples taken after fasting for at least 8 hours.    Objective: There were no vitals filed for this visit.  General: Patient is awake, alert, oriented x 3 and in no acute distress.  Dermatology: Skin is warm and dry bilateral with a  full thickness ulceration present left medial foot. Ulceration measures 5 cm x 1.5cm x fatty tissue and beads. There is erythematous border with a fatty base with antibiotic beads, The ulceration does not probe to bone. There is no malodor, clear to to bloody active drainage, localized edema.    Vascular: Dorsalis Pedis pulse = 1/4 Bilateral,  Posterior Tibial pulse = 1/4 Bilateral,  Capillary Fill Time < 5 seconds  Neurologic: Protective sensation is diminished bilateral.  Musculosketal: No pain to palpation to ulcerated area or left lower leg or calf.  There is Charcot foot deformity noted.   Recent Labs    03/16/21 1053 03/16/21 1112  GRAMSTAIN FEW WBC PRESENT,BOTH PMN AND MONONUCLEAR NO ORGANISMS SEEN  WBC PRESENT, PREDOMINANTLY MONONUCLEAR NO ORGANISMS SEEN   LABORGA STAPHYLOCOCCUS EPIDERMIDIS STAPHYLOCOCCUS EPIDERMIDIS    Assessment and Plan:  Problem List Items Addressed This Visit       Endocrine   Diabetic foot ulcer (HCC) - Primary      Musculoskeletal and Integument   Osteomyelitis (HCC)   Other Visit Diagnoses  PVD (peripheral vascular disease) (HCC)            -Examined patient  -Wound VAC dressing removed in office we attempted to reapply the wound VAC however there was no suction or seal able to be obtained so applied wet-to-dry dressing with posterior splint and advised patient to keep dressing clean dry and intact until home nurse comes out on tomorrow to change the dressing -Contacted [facility] for troubleshooting of the wound VAC [facility] determined that it was a canister issue and to attempt to have the nurse to reapply the [facility] in the home on tomorrow if the wound VAC still feels off right properly then a replacement VAC will be ordered for the patient -Advised patient to continue with nonweightbearing to the left lower extremity -Advised patient to continue with rest and elevation to assist with pain and edema control -Advised patient to continue with oral antibiotics as prescribed at hospital discharge -Return to office to see Dr. Samuella Cota as scheduled on Thursday.  Asencion Islam, DPM

## 2021-03-24 ENCOUNTER — Encounter: Payer: BC Managed Care – PPO | Admitting: Sports Medicine

## 2021-03-24 DIAGNOSIS — Z951 Presence of aortocoronary bypass graft: Secondary | ICD-10-CM | POA: Diagnosis not present

## 2021-03-24 DIAGNOSIS — D649 Anemia, unspecified: Secondary | ICD-10-CM | POA: Diagnosis not present

## 2021-03-24 DIAGNOSIS — Z794 Long term (current) use of insulin: Secondary | ICD-10-CM | POA: Diagnosis not present

## 2021-03-24 DIAGNOSIS — Z7984 Long term (current) use of oral hypoglycemic drugs: Secondary | ICD-10-CM | POA: Diagnosis not present

## 2021-03-24 DIAGNOSIS — I252 Old myocardial infarction: Secondary | ICD-10-CM | POA: Diagnosis not present

## 2021-03-24 DIAGNOSIS — M1990 Unspecified osteoarthritis, unspecified site: Secondary | ICD-10-CM | POA: Diagnosis not present

## 2021-03-24 DIAGNOSIS — E11621 Type 2 diabetes mellitus with foot ulcer: Secondary | ICD-10-CM | POA: Diagnosis not present

## 2021-03-24 DIAGNOSIS — Z7982 Long term (current) use of aspirin: Secondary | ICD-10-CM | POA: Diagnosis not present

## 2021-03-24 DIAGNOSIS — E1169 Type 2 diabetes mellitus with other specified complication: Secondary | ICD-10-CM | POA: Diagnosis not present

## 2021-03-24 DIAGNOSIS — I10 Essential (primary) hypertension: Secondary | ICD-10-CM | POA: Diagnosis not present

## 2021-03-24 DIAGNOSIS — I2510 Atherosclerotic heart disease of native coronary artery without angina pectoris: Secondary | ICD-10-CM | POA: Diagnosis not present

## 2021-03-24 DIAGNOSIS — K219 Gastro-esophageal reflux disease without esophagitis: Secondary | ICD-10-CM | POA: Diagnosis not present

## 2021-03-24 DIAGNOSIS — E785 Hyperlipidemia, unspecified: Secondary | ICD-10-CM | POA: Diagnosis not present

## 2021-03-24 DIAGNOSIS — E1140 Type 2 diabetes mellitus with diabetic neuropathy, unspecified: Secondary | ICD-10-CM | POA: Diagnosis not present

## 2021-03-24 DIAGNOSIS — L97528 Non-pressure chronic ulcer of other part of left foot with other specified severity: Secondary | ICD-10-CM | POA: Diagnosis not present

## 2021-03-24 DIAGNOSIS — M869 Osteomyelitis, unspecified: Secondary | ICD-10-CM | POA: Diagnosis not present

## 2021-03-25 ENCOUNTER — Other Ambulatory Visit: Payer: Self-pay

## 2021-03-25 ENCOUNTER — Ambulatory Visit (INDEPENDENT_AMBULATORY_CARE_PROVIDER_SITE_OTHER): Payer: BC Managed Care – PPO

## 2021-03-25 ENCOUNTER — Ambulatory Visit (INDEPENDENT_AMBULATORY_CARE_PROVIDER_SITE_OTHER): Payer: BC Managed Care – PPO | Admitting: Podiatry

## 2021-03-25 ENCOUNTER — Encounter: Payer: Self-pay | Admitting: Podiatry

## 2021-03-25 DIAGNOSIS — L97423 Non-pressure chronic ulcer of left heel and midfoot with necrosis of muscle: Secondary | ICD-10-CM

## 2021-03-25 DIAGNOSIS — M14672 Charcot's joint, left ankle and foot: Secondary | ICD-10-CM

## 2021-03-25 DIAGNOSIS — E08621 Diabetes mellitus due to underlying condition with foot ulcer: Secondary | ICD-10-CM

## 2021-03-25 DIAGNOSIS — L97403 Non-pressure chronic ulcer of unspecified heel and midfoot with necrosis of muscle: Secondary | ICD-10-CM

## 2021-03-26 ENCOUNTER — Telehealth: Payer: Self-pay | Admitting: Podiatry

## 2021-03-26 DIAGNOSIS — E1140 Type 2 diabetes mellitus with diabetic neuropathy, unspecified: Secondary | ICD-10-CM | POA: Diagnosis not present

## 2021-03-26 DIAGNOSIS — Z794 Long term (current) use of insulin: Secondary | ICD-10-CM | POA: Diagnosis not present

## 2021-03-26 DIAGNOSIS — D649 Anemia, unspecified: Secondary | ICD-10-CM | POA: Diagnosis not present

## 2021-03-26 DIAGNOSIS — Z7984 Long term (current) use of oral hypoglycemic drugs: Secondary | ICD-10-CM | POA: Diagnosis not present

## 2021-03-26 DIAGNOSIS — E11621 Type 2 diabetes mellitus with foot ulcer: Secondary | ICD-10-CM | POA: Diagnosis not present

## 2021-03-26 DIAGNOSIS — Z7982 Long term (current) use of aspirin: Secondary | ICD-10-CM | POA: Diagnosis not present

## 2021-03-26 DIAGNOSIS — I10 Essential (primary) hypertension: Secondary | ICD-10-CM | POA: Diagnosis not present

## 2021-03-26 DIAGNOSIS — I2510 Atherosclerotic heart disease of native coronary artery without angina pectoris: Secondary | ICD-10-CM | POA: Diagnosis not present

## 2021-03-26 DIAGNOSIS — I252 Old myocardial infarction: Secondary | ICD-10-CM | POA: Diagnosis not present

## 2021-03-26 DIAGNOSIS — Z951 Presence of aortocoronary bypass graft: Secondary | ICD-10-CM | POA: Diagnosis not present

## 2021-03-26 DIAGNOSIS — K219 Gastro-esophageal reflux disease without esophagitis: Secondary | ICD-10-CM | POA: Diagnosis not present

## 2021-03-26 DIAGNOSIS — M869 Osteomyelitis, unspecified: Secondary | ICD-10-CM | POA: Diagnosis not present

## 2021-03-26 DIAGNOSIS — L97528 Non-pressure chronic ulcer of other part of left foot with other specified severity: Secondary | ICD-10-CM | POA: Diagnosis not present

## 2021-03-26 DIAGNOSIS — E785 Hyperlipidemia, unspecified: Secondary | ICD-10-CM | POA: Diagnosis not present

## 2021-03-26 DIAGNOSIS — E1169 Type 2 diabetes mellitus with other specified complication: Secondary | ICD-10-CM | POA: Diagnosis not present

## 2021-03-26 DIAGNOSIS — M1990 Unspecified osteoarthritis, unspecified site: Secondary | ICD-10-CM | POA: Diagnosis not present

## 2021-03-26 NOTE — Telephone Encounter (Signed)
Pt call and ask do she change the dressings on the wound today or not please give her a call at [phone] Marchelle Folks is her name she is the home nurse .

## 2021-03-29 ENCOUNTER — Ambulatory Visit: Payer: BC Managed Care – PPO | Admitting: Podiatry

## 2021-03-29 ENCOUNTER — Telehealth: Payer: Self-pay | Admitting: Podiatry

## 2021-03-29 ENCOUNTER — Other Ambulatory Visit: Payer: Self-pay

## 2021-03-29 DIAGNOSIS — Z7982 Long term (current) use of aspirin: Secondary | ICD-10-CM | POA: Diagnosis not present

## 2021-03-29 DIAGNOSIS — M1990 Unspecified osteoarthritis, unspecified site: Secondary | ICD-10-CM | POA: Diagnosis not present

## 2021-03-29 DIAGNOSIS — E1140 Type 2 diabetes mellitus with diabetic neuropathy, unspecified: Secondary | ICD-10-CM | POA: Diagnosis not present

## 2021-03-29 DIAGNOSIS — I2510 Atherosclerotic heart disease of native coronary artery without angina pectoris: Secondary | ICD-10-CM | POA: Diagnosis not present

## 2021-03-29 DIAGNOSIS — E08621 Diabetes mellitus due to underlying condition with foot ulcer: Secondary | ICD-10-CM

## 2021-03-29 DIAGNOSIS — E785 Hyperlipidemia, unspecified: Secondary | ICD-10-CM | POA: Diagnosis not present

## 2021-03-29 DIAGNOSIS — L97403 Non-pressure chronic ulcer of unspecified heel and midfoot with necrosis of muscle: Secondary | ICD-10-CM

## 2021-03-29 DIAGNOSIS — E11621 Type 2 diabetes mellitus with foot ulcer: Secondary | ICD-10-CM | POA: Diagnosis not present

## 2021-03-29 DIAGNOSIS — D649 Anemia, unspecified: Secondary | ICD-10-CM | POA: Diagnosis not present

## 2021-03-29 DIAGNOSIS — Z7984 Long term (current) use of oral hypoglycemic drugs: Secondary | ICD-10-CM | POA: Diagnosis not present

## 2021-03-29 DIAGNOSIS — L97528 Non-pressure chronic ulcer of other part of left foot with other specified severity: Secondary | ICD-10-CM | POA: Diagnosis not present

## 2021-03-29 DIAGNOSIS — I10 Essential (primary) hypertension: Secondary | ICD-10-CM | POA: Diagnosis not present

## 2021-03-29 DIAGNOSIS — I252 Old myocardial infarction: Secondary | ICD-10-CM | POA: Diagnosis not present

## 2021-03-29 DIAGNOSIS — E1169 Type 2 diabetes mellitus with other specified complication: Secondary | ICD-10-CM | POA: Diagnosis not present

## 2021-03-29 DIAGNOSIS — Z951 Presence of aortocoronary bypass graft: Secondary | ICD-10-CM | POA: Diagnosis not present

## 2021-03-29 DIAGNOSIS — Z794 Long term (current) use of insulin: Secondary | ICD-10-CM | POA: Diagnosis not present

## 2021-03-29 DIAGNOSIS — K219 Gastro-esophageal reflux disease without esophagitis: Secondary | ICD-10-CM | POA: Diagnosis not present

## 2021-03-29 DIAGNOSIS — M14672 Charcot's joint, left ankle and foot: Secondary | ICD-10-CM

## 2021-03-29 DIAGNOSIS — M869 Osteomyelitis, unspecified: Secondary | ICD-10-CM | POA: Diagnosis not present

## 2021-03-29 NOTE — Progress Notes (Signed)
  Subjective:  Patient ID: [name], male    DOB: 28-Jan-1956,  MRN: [id number]  No chief complaint on file.  65 y.o. male presents for wound care. Hx confirmed with patient. Did not have the nurse change the [facility] Friday. Doing PT at home, getting stronger. Denies new post-op issues or concerns. Objective:  Physical Exam: Wound healing with some serous exudate. No purulence. No excessive warmth. Some fibrotic tendon ends, some granulation to the wound. Assessment:   1. 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 Charcot's joint of left foot     Plan:  Patient was evaluated and treated and all questions answered.  Ulcer right midfoot -Wound VAC removed and changed today. It was not changed Friday - apparently miscommunication as I did want the VAC changed but patient thought I said it would be ok to leave until Monday. -The wound is a little fibrotic but some of this is also likely tendinous ends. These may need to be debrided to promote granulation tissue. Discussed possibility of having to debride the wound if this would help promote healing. -The midfoot is grossly a little less mobile today. No excessive warmth about the midfoot. Hopefully this is indicative of charcot flare decreasing  Procedure: Wound VAC Application Location: left midfoot Wound Measurement: 6 cm x 3 cm x 0.2 cm  Technique: Black foam to wound base, followed by adherent dressing. Set to 125 mmHg with good seal noted. Disposition: Patient tolerated procedure well.  No follow-ups on file.

## 2021-03-29 NOTE — Progress Notes (Signed)
  Subjective:  Patient ID: [name], male    DOB: [birth year],  MRN: [id number]  Chief Complaint  Patient presents with   Routine Post Op    The nurse came out yesterday and changed the dressing and will be back out tomorrow to change the dressing     65 y.o. male presents for wound care. Hx confirmed with patient.  Objective:  Physical Exam: Dressing clean dry intact, splint intact.  VAC on and functioning  Assessment:   1. 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 Charcot's joint of left foot    Plan:  Patient was evaluated and treated and all questions answered.  Ulcer right midfoot -As wound VAC was just changed yesterday and due for change tomorrow we will hold off changing it today. -X-rays taken.  Stable midfoot dislocation.  Does not appear to have any area of bone that is prominent to contribute to ulceration. -We will follow-up next week for wound VAC change and wound eval.  No follow-ups on file.

## 2021-03-29 NOTE — Telephone Encounter (Signed)
[facility] called office to clarify if wanted wound vac changed by them? According to the patient, Dr Samuella Cota told him not to change it at home, only when he returned to the office with Dr Samuella Cota. Please advise. Thanks

## 2021-03-30 ENCOUNTER — Encounter (HOSPITAL_COMMUNITY): Payer: Self-pay | Admitting: Podiatry

## 2021-03-31 DIAGNOSIS — I252 Old myocardial infarction: Secondary | ICD-10-CM | POA: Diagnosis not present

## 2021-03-31 DIAGNOSIS — Z951 Presence of aortocoronary bypass graft: Secondary | ICD-10-CM | POA: Diagnosis not present

## 2021-03-31 DIAGNOSIS — Z7984 Long term (current) use of oral hypoglycemic drugs: Secondary | ICD-10-CM | POA: Diagnosis not present

## 2021-03-31 DIAGNOSIS — I2510 Atherosclerotic heart disease of native coronary artery without angina pectoris: Secondary | ICD-10-CM | POA: Diagnosis not present

## 2021-03-31 DIAGNOSIS — I10 Essential (primary) hypertension: Secondary | ICD-10-CM | POA: Diagnosis not present

## 2021-03-31 DIAGNOSIS — E785 Hyperlipidemia, unspecified: Secondary | ICD-10-CM | POA: Diagnosis not present

## 2021-03-31 DIAGNOSIS — K219 Gastro-esophageal reflux disease without esophagitis: Secondary | ICD-10-CM | POA: Diagnosis not present

## 2021-03-31 DIAGNOSIS — Z794 Long term (current) use of insulin: Secondary | ICD-10-CM | POA: Diagnosis not present

## 2021-03-31 DIAGNOSIS — M869 Osteomyelitis, unspecified: Secondary | ICD-10-CM | POA: Diagnosis not present

## 2021-03-31 DIAGNOSIS — L97528 Non-pressure chronic ulcer of other part of left foot with other specified severity: Secondary | ICD-10-CM | POA: Diagnosis not present

## 2021-03-31 DIAGNOSIS — E11621 Type 2 diabetes mellitus with foot ulcer: Secondary | ICD-10-CM | POA: Diagnosis not present

## 2021-03-31 DIAGNOSIS — D649 Anemia, unspecified: Secondary | ICD-10-CM | POA: Diagnosis not present

## 2021-03-31 DIAGNOSIS — Z7982 Long term (current) use of aspirin: Secondary | ICD-10-CM | POA: Diagnosis not present

## 2021-03-31 DIAGNOSIS — M1990 Unspecified osteoarthritis, unspecified site: Secondary | ICD-10-CM | POA: Diagnosis not present

## 2021-03-31 DIAGNOSIS — E1169 Type 2 diabetes mellitus with other specified complication: Secondary | ICD-10-CM | POA: Diagnosis not present

## 2021-03-31 DIAGNOSIS — E1140 Type 2 diabetes mellitus with diabetic neuropathy, unspecified: Secondary | ICD-10-CM | POA: Diagnosis not present

## 2021-04-01 DIAGNOSIS — E11621 Type 2 diabetes mellitus with foot ulcer: Secondary | ICD-10-CM | POA: Diagnosis not present

## 2021-04-01 DIAGNOSIS — Z951 Presence of aortocoronary bypass graft: Secondary | ICD-10-CM | POA: Diagnosis not present

## 2021-04-01 DIAGNOSIS — Z7982 Long term (current) use of aspirin: Secondary | ICD-10-CM | POA: Diagnosis not present

## 2021-04-01 DIAGNOSIS — L97528 Non-pressure chronic ulcer of other part of left foot with other specified severity: Secondary | ICD-10-CM | POA: Diagnosis not present

## 2021-04-01 DIAGNOSIS — I10 Essential (primary) hypertension: Secondary | ICD-10-CM | POA: Diagnosis not present

## 2021-04-01 DIAGNOSIS — I252 Old myocardial infarction: Secondary | ICD-10-CM | POA: Diagnosis not present

## 2021-04-01 DIAGNOSIS — K219 Gastro-esophageal reflux disease without esophagitis: Secondary | ICD-10-CM | POA: Diagnosis not present

## 2021-04-01 DIAGNOSIS — I2510 Atherosclerotic heart disease of native coronary artery without angina pectoris: Secondary | ICD-10-CM | POA: Diagnosis not present

## 2021-04-01 DIAGNOSIS — M869 Osteomyelitis, unspecified: Secondary | ICD-10-CM | POA: Diagnosis not present

## 2021-04-01 DIAGNOSIS — Z794 Long term (current) use of insulin: Secondary | ICD-10-CM | POA: Diagnosis not present

## 2021-04-01 DIAGNOSIS — Z7984 Long term (current) use of oral hypoglycemic drugs: Secondary | ICD-10-CM | POA: Diagnosis not present

## 2021-04-01 DIAGNOSIS — M1990 Unspecified osteoarthritis, unspecified site: Secondary | ICD-10-CM | POA: Diagnosis not present

## 2021-04-01 DIAGNOSIS — E1140 Type 2 diabetes mellitus with diabetic neuropathy, unspecified: Secondary | ICD-10-CM | POA: Diagnosis not present

## 2021-04-01 DIAGNOSIS — E785 Hyperlipidemia, unspecified: Secondary | ICD-10-CM | POA: Diagnosis not present

## 2021-04-01 DIAGNOSIS — D649 Anemia, unspecified: Secondary | ICD-10-CM | POA: Diagnosis not present

## 2021-04-01 DIAGNOSIS — E1169 Type 2 diabetes mellitus with other specified complication: Secondary | ICD-10-CM | POA: Diagnosis not present

## 2021-04-02 DIAGNOSIS — Z794 Long term (current) use of insulin: Secondary | ICD-10-CM | POA: Diagnosis not present

## 2021-04-02 DIAGNOSIS — E785 Hyperlipidemia, unspecified: Secondary | ICD-10-CM | POA: Diagnosis not present

## 2021-04-02 DIAGNOSIS — M1990 Unspecified osteoarthritis, unspecified site: Secondary | ICD-10-CM | POA: Diagnosis not present

## 2021-04-02 DIAGNOSIS — L97528 Non-pressure chronic ulcer of other part of left foot with other specified severity: Secondary | ICD-10-CM | POA: Diagnosis not present

## 2021-04-02 DIAGNOSIS — K219 Gastro-esophageal reflux disease without esophagitis: Secondary | ICD-10-CM | POA: Diagnosis not present

## 2021-04-02 DIAGNOSIS — I10 Essential (primary) hypertension: Secondary | ICD-10-CM | POA: Diagnosis not present

## 2021-04-02 DIAGNOSIS — D649 Anemia, unspecified: Secondary | ICD-10-CM | POA: Diagnosis not present

## 2021-04-02 DIAGNOSIS — M869 Osteomyelitis, unspecified: Secondary | ICD-10-CM | POA: Diagnosis not present

## 2021-04-02 DIAGNOSIS — E1140 Type 2 diabetes mellitus with diabetic neuropathy, unspecified: Secondary | ICD-10-CM | POA: Diagnosis not present

## 2021-04-02 DIAGNOSIS — E1169 Type 2 diabetes mellitus with other specified complication: Secondary | ICD-10-CM | POA: Diagnosis not present

## 2021-04-02 DIAGNOSIS — Z7982 Long term (current) use of aspirin: Secondary | ICD-10-CM | POA: Diagnosis not present

## 2021-04-02 DIAGNOSIS — E11621 Type 2 diabetes mellitus with foot ulcer: Secondary | ICD-10-CM | POA: Diagnosis not present

## 2021-04-02 DIAGNOSIS — Z7984 Long term (current) use of oral hypoglycemic drugs: Secondary | ICD-10-CM | POA: Diagnosis not present

## 2021-04-02 DIAGNOSIS — Z951 Presence of aortocoronary bypass graft: Secondary | ICD-10-CM | POA: Diagnosis not present

## 2021-04-02 DIAGNOSIS — I252 Old myocardial infarction: Secondary | ICD-10-CM | POA: Diagnosis not present

## 2021-04-02 DIAGNOSIS — I2510 Atherosclerotic heart disease of native coronary artery without angina pectoris: Secondary | ICD-10-CM | POA: Diagnosis not present

## 2021-04-05 ENCOUNTER — Other Ambulatory Visit: Payer: Self-pay

## 2021-04-05 ENCOUNTER — Telehealth: Payer: Self-pay | Admitting: Sports Medicine

## 2021-04-05 ENCOUNTER — Ambulatory Visit: Payer: Self-pay | Admitting: Podiatry

## 2021-04-05 ENCOUNTER — Ambulatory Visit: Payer: BC Managed Care – PPO | Admitting: Podiatry

## 2021-04-05 DIAGNOSIS — E08621 Diabetes mellitus due to underlying condition with foot ulcer: Secondary | ICD-10-CM | POA: Diagnosis not present

## 2021-04-05 DIAGNOSIS — L97403 Non-pressure chronic ulcer of unspecified heel and midfoot with necrosis of muscle: Secondary | ICD-10-CM | POA: Diagnosis not present

## 2021-04-05 DIAGNOSIS — M14672 Charcot's joint, left ankle and foot: Secondary | ICD-10-CM

## 2021-04-05 NOTE — Telephone Encounter (Signed)
Patient called answering service and has questions about if he can drive. I got clarity from Dr. Samuella Cota who states that patient can drive however for his surgery on 11/1 can NOT drive on that day. Patient expressed understanding and thanked me for calling him back. -Dr. Marylene Land

## 2021-04-06 ENCOUNTER — Telehealth: Payer: Self-pay | Admitting: Urology

## 2021-04-06 NOTE — Telephone Encounter (Signed)
DOS - 04/13/21   WOUND DEBRIDEMENT AND VAC APPLICATION --- 11043 AND 94174   [employer] EFFECTIVE DATE - 06/14/15   SPOKE WITH SARA WITH [employer] AND SHE STATED THAT FOR CPT CODE 11043 AND 08144 NO PRIOR AUTH IS REQUIRED.   REF # [id number]

## 2021-04-07 ENCOUNTER — Telehealth: Payer: Self-pay

## 2021-04-07 DIAGNOSIS — Z7982 Long term (current) use of aspirin: Secondary | ICD-10-CM | POA: Diagnosis not present

## 2021-04-07 DIAGNOSIS — M1990 Unspecified osteoarthritis, unspecified site: Secondary | ICD-10-CM | POA: Diagnosis not present

## 2021-04-07 DIAGNOSIS — I2510 Atherosclerotic heart disease of native coronary artery without angina pectoris: Secondary | ICD-10-CM | POA: Diagnosis not present

## 2021-04-07 DIAGNOSIS — E1140 Type 2 diabetes mellitus with diabetic neuropathy, unspecified: Secondary | ICD-10-CM | POA: Diagnosis not present

## 2021-04-07 DIAGNOSIS — Z951 Presence of aortocoronary bypass graft: Secondary | ICD-10-CM | POA: Diagnosis not present

## 2021-04-07 DIAGNOSIS — E785 Hyperlipidemia, unspecified: Secondary | ICD-10-CM | POA: Diagnosis not present

## 2021-04-07 DIAGNOSIS — L97528 Non-pressure chronic ulcer of other part of left foot with other specified severity: Secondary | ICD-10-CM | POA: Diagnosis not present

## 2021-04-07 DIAGNOSIS — I252 Old myocardial infarction: Secondary | ICD-10-CM | POA: Diagnosis not present

## 2021-04-07 DIAGNOSIS — I10 Essential (primary) hypertension: Secondary | ICD-10-CM | POA: Diagnosis not present

## 2021-04-07 DIAGNOSIS — D649 Anemia, unspecified: Secondary | ICD-10-CM | POA: Diagnosis not present

## 2021-04-07 DIAGNOSIS — E11621 Type 2 diabetes mellitus with foot ulcer: Secondary | ICD-10-CM | POA: Diagnosis not present

## 2021-04-07 DIAGNOSIS — Z794 Long term (current) use of insulin: Secondary | ICD-10-CM | POA: Diagnosis not present

## 2021-04-07 DIAGNOSIS — Z7984 Long term (current) use of oral hypoglycemic drugs: Secondary | ICD-10-CM | POA: Diagnosis not present

## 2021-04-07 DIAGNOSIS — M869 Osteomyelitis, unspecified: Secondary | ICD-10-CM | POA: Diagnosis not present

## 2021-04-07 DIAGNOSIS — R809 Proteinuria, unspecified: Secondary | ICD-10-CM | POA: Diagnosis not present

## 2021-04-07 DIAGNOSIS — K219 Gastro-esophageal reflux disease without esophagitis: Secondary | ICD-10-CM | POA: Diagnosis not present

## 2021-04-07 DIAGNOSIS — Z01810 Encounter for preprocedural cardiovascular examination: Secondary | ICD-10-CM | POA: Diagnosis not present

## 2021-04-07 DIAGNOSIS — E1169 Type 2 diabetes mellitus with other specified complication: Secondary | ICD-10-CM | POA: Diagnosis not present

## 2021-04-07 NOTE — Progress Notes (Addendum)
COVID swab appointment: n/a  COVID Vaccine Completed: yes x4 Date COVID Vaccine completed: Has received booster: COVID vaccine manufacturer: Pfizer    Moderna    Date of COVID positive in last 90 days: no  PCP - [name], MD Cardiologist - [facility]  Cardiac clearance by Dr. Bing Matter 04/08/21 in Epic.  Chest x-ray - n/a EKG - 04/07/21 on chart Stress Test - yes 2019 ECHO - 12/26/17 Epic Cardiac Cath - 12/19/17 Epic Pacemaker/ICD device last checked: n/a Spinal Cord Stimulator: n/a  Sleep Study - n/a CPAP -   Fasting Blood Sugar - 130s Checks Blood Sugar _3_ times a day  Blood Thinner Instructions: Aspirin Instructions: ASA 81 Last Dose:  Activity level: Can perform activities of daily living without stopping and without symptoms of chest pain or shortness of breath. No walking up stairs currently due to foot wound, uses ramp    Anesthesia review: HTN, CAD, DM, CABG x3, PAT A1C 8.1  Patient denies shortness of breath, fever, cough and chest pain at PAT appointment   Patient verbalized understanding of instructions that were given to them at the PAT appointment. Patient was also instructed that they will need to review over the PAT instructions again at home before surgery.

## 2021-04-07 NOTE — Telephone Encounter (Signed)
Spoke to patient. He states he will have surgery form sent here. Informed him we cannot clear him since we have not seen him in 2 years. He is upset as his surgery is next Tuesday. Will wait for form tomorrow per Dr. Bing Matter.

## 2021-04-07 NOTE — Patient Instructions (Addendum)
DUE TO COVID-19 ONLY ONE VISITOR IS ALLOWED TO COME WITH YOU AND STAY IN THE WAITING ROOM ONLY DURING PRE OP AND PROCEDURE.   **NO VISITORS ARE ALLOWED IN THE SHORT STAY AREA OR RECOVERY ROOM!!**       Your procedure is scheduled on: 04/13/21   Report to [facility] Main Entrance    Report to admitting at 4:00 PM   Call this number if you have problems the morning of surgery [phone]   Do not eat food :After Midnight.   May have liquids until 3:15 PM day of surgery  CLEAR LIQUID DIET  Foods Allowed                                                                     Foods Excluded  Water, Black Coffee and tea (no milk or creamer)            liquids that you cannot  Plain Jell-O in any flavor  (No red)                                     see through such as: Fruit ices (not with fruit pulp)                                             milk, soups, orange juice              Iced Popsicles (No red)                                                  All solid food                                   Apple juices Sports drinks like Gatorade (No red) Lightly seasoned clear broth or consume(fat free) Sugar      The day of surgery:  Drink ONE (1) Pre-Surgery Clear Ensure by 3:15 pm the morning of surgery. Drink in one sitting. Do not sip.  This drink was given to you during your hospital  pre-op appointment visit. Nothing else to drink after completing the  Pre-Surgery Clear Ensure.          If you have questions, please contact your surgeon's office.     Oral Hygiene is also important to reduce your risk of infection.                                    Remember - BRUSH YOUR TEETH THE MORNING OF SURGERY WITH YOUR REGULAR TOOTHPASTE    Take these medicines the morning of surgery with A SIP OF WATER: Lipitor, Metoprolol.   DO NOT TAKE ANY ORAL DIABETIC MEDICATIONS DAY OF YOUR SURGERY  How to Manage Your Diabetes Before and After  Surgery  Why is it important to control  my blood sugar before and after surgery? Improving blood sugar levels before and after surgery helps healing and can limit problems. A way of improving blood sugar control is eating a healthy diet by:  Eating less sugar and carbohydrates  Increasing activity/exercise  Talking with your doctor about reaching your blood sugar goals High blood sugars (greater than 180 mg/dL) can raise your risk of infections and slow your recovery, so you will need to focus on controlling your diabetes during the weeks before surgery. Make sure that the doctor who takes care of your diabetes knows about your planned surgery including the date and location.  How do I manage my blood sugar before surgery? Check your blood sugar at least 4 times a day, starting 2 days before surgery, to make sure that the level is not too high or low. Check your blood sugar the morning of your surgery when you wake up and every 2 hours until you get to the Short Stay unit. If your blood sugar is less than 70 mg/dL, you will need to treat for low blood sugar: Do not take insulin. Treat a low blood sugar (less than 70 mg/dL) with  cup of clear juice (cranberry or apple), 4 glucose tablets, OR glucose gel. Recheck blood sugar in 15 minutes after treatment (to make sure it is greater than 70 mg/dL). If your blood sugar is not greater than 70 mg/dL on recheck, call [phone] for further instructions. Report your blood sugar to the short stay nurse when you get to Short Stay.  If you are admitted to the hospital after surgery: Your blood sugar will be checked by the staff and you will probably be given insulin after surgery (instead of oral diabetes medicines) to make sure you have good blood sugar levels. The goal for blood sugar control after surgery is 80-180 mg/dL.   WHAT DO I DO ABOUT MY DIABETES MEDICATION?  Do not take oral diabetes medicines (pills) the morning of surgery.  THE DAY BEFORE SURGERY, take Metformin and  Januvia as prescribed. Take 100% of morning Lantus and 50% of evening Lantus.    THE MORNING OF SURGERY, take 50% of Lantus. Do not take Metformin or Januvia.   Reviewed and Endorsed by [facility] Patient Education Committee, August 2015                               You may not have any metal on your body including jewelry, and body piercing             Do not wear lotions, powders, cologne, or deodorant              Men may shave face and neck.   Do not bring valuables to the hospital. [place] IS NOT             RESPONSIBLE   FOR VALUABLES.    Patients discharged on the day of surgery will not be allowed to drive home.  Special Instructions: Bring a copy of your healthcare power of attorney and living will documents         the day of surgery if you haven't scanned them before.   Please read over the following fact sheets you were given: IF YOU HAVE QUESTIONS ABOUT YOUR PRE-OP INSTRUCTIONS PLEASE CALL [phone]- [facility] - Preparing for Surgery Before surgery, you can  play an important role.  Because skin is not sterile, your skin needs to be as free of germs as possible.  You can reduce the number of germs on your skin by washing with CHG (chlorahexidine gluconate) soap before surgery.  CHG is an antiseptic cleaner which kills germs and bonds with the skin to continue killing germs even after washing. Please DO NOT use if you have an allergy to CHG or antibacterial soaps.  If your skin becomes reddened/irritated stop using the CHG and inform your nurse when you arrive at Short Stay. Do not shave (including legs and underarms) for at least 48 hours prior to the first CHG shower.  You may shave your face/neck.  Please follow these instructions carefully:  1.  Shower with CHG Soap the night before surgery and the  morning of surgery.  2.  If you choose to wash your hair, wash your hair first as usual with your normal  shampoo.  3.  After you shampoo, rinse your hair  and body thoroughly to remove the shampoo.                             4.  Use CHG as you would any other liquid soap.  You can apply chg directly to the skin and wash.  Gently with a scrungie or clean washcloth.  5.  Apply the CHG Soap to your body ONLY FROM THE NECK DOWN.   Do   not use on face/ open                           Wound or open sores. Avoid contact with eyes, ears mouth and   genitals (private parts).                       Wash face,  Genitals (private parts) with your normal soap.             6.  Wash thoroughly, paying special attention to the area where your    surgery  will be performed.  7.  Thoroughly rinse your body with warm water from the neck down.  8.  DO NOT shower/wash with your normal soap after using and rinsing off the CHG Soap.                9.  Pat yourself dry with a clean towel.            10.  Wear clean pajamas.            11.  Place clean sheets on your bed the night of your first shower and do not  sleep with pets. Day of Surgery : Do not apply any lotions/deodorants the morning of surgery.  Please wear clean clothes to the hospital/surgery center.  FAILURE TO FOLLOW THESE INSTRUCTIONS MAY RESULT IN THE CANCELLATION OF YOUR SURGERY  PATIENT SIGNATURE_________________________________  NURSE SIGNATURE__________________________________  ________________________________________________________________________   [name]  An incentive spirometer is a tool that can help keep your lungs clear and active. This tool measures how well you are filling your lungs with each breath. Taking long deep breaths may help reverse or decrease the chance of developing breathing (pulmonary) problems (especially infection) following: A long period of time when you are unable to move or be active. BEFORE THE PROCEDURE  If the spirometer includes an indicator to show your best effort, your  nurse or respiratory therapist will set it to a desired goal. If possible,  sit up straight or lean slightly forward. Try not to slouch. Hold the incentive spirometer in an upright position. INSTRUCTIONS FOR USE  Sit on the edge of your bed if possible, or sit up as far as you can in bed or on a chair. Hold the incentive spirometer in an upright position. Breathe out normally. Place the mouthpiece in your mouth and seal your lips tightly around it. Breathe in slowly and as deeply as possible, raising the piston or the ball toward the top of the column. Hold your breath for 3-5 seconds or for as long as possible. Allow the piston or ball to fall to the bottom of the column. Remove the mouthpiece from your mouth and breathe out normally. Rest for a few seconds and repeat Steps 1 through 7 at least 10 times every 1-2 hours when you are awake. Take your time and take a few normal breaths between deep breaths. The spirometer may include an indicator to show your best effort. Use the indicator as a goal to work toward during each repetition. After each set of 10 deep breaths, practice coughing to be sure your lungs are clear. If you have an incision (the cut made at the time of surgery), support your incision when coughing by placing a pillow or rolled up towels firmly against it. Once you are able to get out of bed, walk around indoors and cough well. You may stop using the incentive spirometer when instructed by your caregiver.  RISKS AND COMPLICATIONS Take your time so you do not get dizzy or light-headed. If you are in pain, you may need to take or ask for pain medication before doing incentive spirometry. It is harder to take a deep breath if you are having pain. AFTER USE Rest and breathe slowly and easily. It can be helpful to keep track of a log of your progress. Your caregiver can provide you with a simple table to help with this. If you are using the spirometer at home, follow these instructions: SEEK MEDICAL CARE IF:  You are having difficultly using the  spirometer. You have trouble using the spirometer as often as instructed. Your pain medication is not giving enough relief while using the spirometer. You develop fever of 100.5 F (38.1 C) or higher. SEEK IMMEDIATE MEDICAL CARE IF:  You cough up bloody sputum that had not been present before. You develop fever of 102 F (38.9 C) or greater. You develop worsening pain at or near the incision site. MAKE SURE YOU:  Understand these instructions. Will watch your condition. Will get help right away if you are not doing well or get worse. Document Released: 10/10/2006 Document Revised: 08/22/2011 Document Reviewed: 12/11/2006 [employer] [facility] Patient Information 2014 [place], [place].   ________________________________________________________________________

## 2021-04-07 NOTE — Telephone Encounter (Signed)
He's having surgery on his foot next week, he just came from Dr. Ulice Dash office and they did an EKG and he said everything came back fine..but Dr. Nedra Hai said he wouldnt give the okay for him to have the surgery unless Dr. Kirtland Bouchard gave the okay first. His surgery is schedule for Tuesday November 1st at 2pm. He is requesting a call back today if possible. CB # [phone] Thank you

## 2021-04-08 ENCOUNTER — Other Ambulatory Visit: Payer: Self-pay

## 2021-04-08 ENCOUNTER — Ambulatory Visit: Payer: BC Managed Care – PPO | Admitting: Cardiology

## 2021-04-08 ENCOUNTER — Telehealth: Payer: Self-pay | Admitting: Cardiology

## 2021-04-08 ENCOUNTER — Encounter (HOSPITAL_COMMUNITY)
Admission: RE | Admit: 2021-04-08 | Discharge: 2021-04-08 | Disposition: A | Discharge status: Home / Self Care | Payer: BC Managed Care – PPO | Source: Ambulatory Visit | Attending: Podiatry | Admitting: Podiatry

## 2021-04-08 ENCOUNTER — Encounter: Payer: Self-pay | Admitting: Cardiology

## 2021-04-08 ENCOUNTER — Encounter (HOSPITAL_COMMUNITY): Payer: Self-pay

## 2021-04-08 VITALS — BP 124/86 | HR 88 | Ht 74.0 in | Wt 186.0 lb

## 2021-04-08 VITALS — BP 117/97 | HR 84 | Temp 98.0°F | Resp 18 | Ht 72.0 in | Wt 185.0 lb

## 2021-04-08 DIAGNOSIS — E1140 Type 2 diabetes mellitus with diabetic neuropathy, unspecified: Secondary | ICD-10-CM | POA: Diagnosis not present

## 2021-04-08 DIAGNOSIS — E7800 Pure hypercholesterolemia, unspecified: Secondary | ICD-10-CM

## 2021-04-08 DIAGNOSIS — I10 Essential (primary) hypertension: Secondary | ICD-10-CM | POA: Diagnosis not present

## 2021-04-08 DIAGNOSIS — M869 Osteomyelitis, unspecified: Secondary | ICD-10-CM | POA: Diagnosis not present

## 2021-04-08 DIAGNOSIS — Z951 Presence of aortocoronary bypass graft: Secondary | ICD-10-CM | POA: Diagnosis not present

## 2021-04-08 DIAGNOSIS — Z01818 Encounter for other preprocedural examination: Secondary | ICD-10-CM | POA: Diagnosis not present

## 2021-04-08 DIAGNOSIS — I429 Cardiomyopathy, unspecified: Secondary | ICD-10-CM

## 2021-04-08 HISTORY — DX: Essential (primary) hypertension: I10

## 2021-04-08 LAB — HEMOGLOBIN A1C
Hgb A1c MFr Bld: 8.1 % — ABNORMAL HIGH (ref 4.8–5.6)
Mean Plasma Glucose: 185.77 mg/dL

## 2021-04-08 LAB — GLUCOSE, CAPILLARY: Glucose-Capillary: 182 mg/dL — ABNORMAL HIGH (ref 70–99)

## 2021-04-08 NOTE — Telephone Encounter (Signed)
Will send FYI to requesting office pt has appt today with Dr. Bing Matter. Office note from cardiologist will be faxed over once cleared or not cleared.

## 2021-04-08 NOTE — Telephone Encounter (Signed)
Spoke to patient. Informed him we do not have surgery form yet. Spoke with Dr. Bing Matter. Will get patient in for appointment today.

## 2021-04-08 NOTE — Telephone Encounter (Signed)
   Pt is calling back and requesting to speak with Hayley again 

## 2021-04-08 NOTE — Addendum Note (Signed)
Addended by: Roosvelt Harps R on: 04/08/2021 03:25 PM   Modules accepted: Orders

## 2021-04-08 NOTE — Progress Notes (Signed)
Cardiology Office Note:    Date:  04/08/2021   ID:  [name], DOB 05-Dec-1955, MRN [id number]  PCP:  [name], MD  Cardiologist:  Gypsy Balsam, MD    Referring MD: [name], MD   Chief Complaint  Patient presents with   Clearance 04/13/2021    L foot debriment , Dr. Samuella Cota     History of Present Illness:    [name] is a 65 y.o. male past medical history significant for diabetes mellitus, dyslipidemia, coronary artery disease, in 2019 he did have coronary bypass graft done with LIMA to LAD SVG to left circumflex, SVG to PDA that was done at [facility].  At the time of surgery he had diminished left ventricle ejection fraction.  I did see him last time in 2020 and since that time he did not follow-up.  Recently he was found to have diabetic ulcer on the left foot he required related to surgery to debride the wound third surgery was contemplated and he was sent to [place] for evaluation before the surgery.  Cardiac wise he is doing well.  He denies have any chest pain tightness squeezing pressure burning chest no palpitations no dizziness no swelling of lower extremities.  No cardiac symptoms whatsoever.  His mobility of course is diminished because of problems left foot however he is able to move around with a scooter quite effectively.  Past Medical History:  Diagnosis Date   Arthritis    Coronary artery disease    Diabetes mellitus without complication (HCC)    Type II   GERD (gastroesophageal reflux disease)    Hypertension    Myocardial infarction [facility])    "Silent"   Neuropathy     Past Surgical History:  Procedure Laterality Date   APPLICATION OF WOUND VAC Left 03/19/2021   Procedure: APPLICATION OF WOUND VAC;  Surgeon: Park Liter, DPM;  Location: [facility] ORS;  Service: Podiatry;  Laterality: Left;   CATARACT EXTRACTION Bilateral    COLONOSCOPY W/ POLYPECTOMY     CORONARY ARTERY BYPASS GRAFT N/A 12/26/2017   Procedure: CORONARY ARTERY BYPASS GRAFTING (CABG)  x 3 Using Left Internal Mammary Artery (LIMA) and Edoscopically Harvested Left Leg Greater Saphenous Vein Graft (SVG); -LIMA to LAD, -SVG to LEFT CIRCUMFLEX, -SVG to PDA,;  Surgeon: Kerin Perna, MD;  [facility];  Service: Open Heart Surgery;  Laterality: N/A;   ENDOVEIN HARVEST OF GREATER SAPHENOUS VEIN Left 12/26/2017   Procedure: ENDOVEIN HARVEST OF GREATER SAPHENOUS VEIN: Left Thigh to below the knee;  Surgeon: Kerin Perna, MD;  Location: [facility] OR;  Service: Open Heart Surgery;  Laterality: Left;   INTRAVASCULAR PRESSURE WIRE/FFR STUDY N/A 12/19/2017   Procedure: INTRAVASCULAR PRESSURE WIRE/FFR STUDY;  Surgeon: Lyn Records, MD;  Location: [facility] INVASIVE CV LAB;  Service: Cardiovascular;  Laterality: N/A;   LEFT HEART CATH AND CORONARY ANGIOGRAPHY N/A 12/19/2017   Procedure: LEFT HEART CATH AND CORONARY ANGIOGRAPHY;  Surgeon: Lyn Records, MD;  Location: [facility] INVASIVE CV LAB;  Service: Cardiovascular;  Laterality: N/A;   TEE WITHOUT CARDIOVERSION N/A 12/26/2017   Procedure: TRANSESOPHAGEAL ECHOCARDIOGRAM (TEE);  Surgeon: Donata Clay, Theron Arista, MD;  Location: [facility] OR;  Service: Open Heart Surgery;  Laterality: N/A;   VASECTOMY     WOUND DEBRIDEMENT Left 03/16/2021   Procedure: DEBRIDEMENT LEFT FOOT WOUND;  Surgeon: Park Liter, DPM;  Location: [facility] ORS;  Service: Podiatry;  Laterality: Left;   WOUND DEBRIDEMENT Left 03/19/2021   Procedure: DEBRIDEMENT WOUND;  Surgeon: Park Liter, DPM;  Location: [facility] ORS;  Service: Podiatry;  Laterality: Left;    Current Medications: Current Meds  Medication Sig   acetaminophen (TYLENOL) 500 MG tablet Take 2 tablets (1,000 mg total) by mouth every 6 (six) hours as needed for mild pain or fever.   aspirin EC 81 MG tablet Take 81 mg by mouth daily.   atorvastatin (LIPITOR) 80 MG tablet Take 80 mg by mouth daily.   calcium carbonate (TUMS - DOSED IN MG ELEMENTAL CALCIUM) 500 MG chewable tablet Chew 1-2 tablets by mouth daily as needed for indigestion or  heartburn.   cephALEXin (KEFLEX) 250 MG capsule Take 250 mg by mouth 4 (four) times daily.   ibuprofen (ADVIL) 200 MG tablet Take 400 mg by mouth every 6 (six) hours as needed for mild pain.   LANTUS 100 UNIT/ML injection Inject 25 Units into the skin 2 (two) times daily.   lisinopril (ZESTRIL) 10 MG tablet Take 10 mg by mouth daily.   metFORMIN (GLUCOPHAGE) 850 MG tablet Take 850-1,700 mg by mouth See admin instructions. Take 850 mg in the morning and 1700 mg at night   metoprolol succinate (TOPROL-XL) 50 MG 24 hr tablet Take 1 tablet (50 mg total) by mouth daily. Take with or immediately following a meal.   sitaGLIPtin (JANUVIA) 100 MG tablet Take 100 mg by mouth every morning.    Tetrahydrozoline HCl (VISINE OP) Place 1 drop into both eyes daily as needed (redness).     Allergies:   Codeine sulfate [codeine]   Social History   Socioeconomic History   Marital status: Married    Spouse name: Not on file   Number of children: Not on file   Years of education: Not on file   Highest education level: Not on file  Occupational History   Not on file  Tobacco Use   Smoking status: Never   Smokeless tobacco: Never  Vaping Use   Vaping Use: Never used  Substance and Sexual Activity   Alcohol use: Not Currently    Comment: occasional- 1 beer every 3 monthly   Drug use: Never   Sexual activity: Not on file  Other Topics Concern   Not on file  Social History Narrative   Not on file   Social Determinants of Health   Financial Resource Strain: Not on file  Food Insecurity: Not on file  Transportation Needs: Not on file  Physical Activity: Not on file  Stress: Not on file  Social Connections: Not on file     Family History: The patient's family history includes Diabetes in his daughter, father, and mother; Heart disease in his father. ROS:   Please see the history of present illness.    All 14 point review of systems negative except as described per history of present  illness  EKGs/Labs/Other Studies Reviewed:      Recent Labs: 03/15/2021: ALT 15 03/20/2021: BUN 19; Hemoglobin 11.6; Platelets 248; Potassium 4.5; Sodium 133 03/21/2021: Creatinine, Ser 0.95  Recent Lipid Panel No results found for: CHOL, TRIG, HDL, CHOLHDL, VLDL, LDLCALC, LDLDIRECT  Physical Exam:    VS:  BP 124/86 (BP Location: Left Arm, Patient Position: Sitting)   Pulse 88   Ht 6\' 2"  (1.88 m)   Wt 186 lb (84.4 kg)   SpO2 99%   BMI 23.88 kg/m     Wt Readings from Last 3 Encounters:  04/08/21 186 lb (84.4 kg)  04/08/21 185 lb (83.9 kg)  03/21/21 181 lb 3.5  oz (82.2 kg)     GEN:  Well nourished, well developed in no acute distress HEENT: Normal NECK: No JVD; No carotid bruits LYMPHATICS: No lymphadenopathy CARDIAC: RRR, no murmurs, no rubs, no gallops RESPIRATORY:  Clear to auscultation without rales, wheezing or rhonchi  ABDOMEN: Soft, non-tender, non-distended MUSCULOSKELETAL:  No edema; No deformity  SKIN: Warm and dry LOWER EXTREMITIES: no swelling NEUROLOGIC:  Alert and oriented x 3 PSYCHIATRIC:  Normal affect   ASSESSMENT:    1. S/P CABG x 3   2. Osteomyelitis of left foot, unspecified type (HCC)   3. Primary hypertension   4. Controlled type 2 diabetes mellitus with neuropathy (HCC)   5. Hypercholesterolemia    PLAN:    In order of problems listed above:  Coronary artery disease status post coronary bypass graft done in 2019.  He seems to be asymptomatic.  He is on appropriate medications which include antiplatelets therapy as well as beta-blocker and ACE inhibitor which I will continue.  Obviously he is on statin. Dyslipidemia I did review K PN from some of this year showing LDL 57 HDL 53 that is a good cholesterol profile continue present management which include high intense statin Lipitor 80. Essential hypertension blood pressure well controlled continue present management. Osteomyelitis of the left foot.  He required event and had wound debridement.   It looks like his can be done under sedation with local anesthesia.  That should be no contraindication from heart point of view to do it.  Overall he is hemodynamically stable denies having any recent angina.  I advised to continue all medications as prescribed.  I see him back in my office in about 6 months but I will schedule him to have an echocardiogram to make sure his ejection fraction is known.   Medication Adjustments/Labs and Tests Ordered: Current medicines are reviewed at length with the patient today.  Concerns regarding medicines are outlined above.  No orders of the defined types were placed in this encounter.  Medication changes: No orders of the defined types were placed in this encounter.   Signed, Georgeanna Lea, MD, [facility] 04/08/2021 3:07 PM    [facility]

## 2021-04-08 NOTE — Patient Instructions (Signed)
Medication Instructions:  Your physician recommends that you continue on your current medications as directed. Please refer to the Current Medication list given to you today.  *If you need a refill on your cardiac medications before your next appointment, please call your pharmacy*   Lab Work: NONE If you have labs (blood work) drawn today and your tests are completely normal, you will receive your results only by: MyChart Message (if you have MyChart) OR A paper copy in the mail If you have any lab test that is abnormal or we need to change your treatment, we will call you to review the results.   Testing/Procedures: Your physician has requested that you have an echocardiogram. Echocardiography is a painless test that uses sound waves to create images of your heart. It provides your doctor with information about the size and shape of your heart and how well your heart's chambers and valves are working. This procedure takes approximately one hour. There are no restrictions for this procedure.    Follow-Up: At [facility], you and your health needs are our priority.  As part of our continuing mission to provide you with exceptional heart care, we have created designated Provider Care Teams.  These Care Teams include your primary Cardiologist (physician) and Advanced Practice Providers (APPs -  Physician Assistants and Nurse Practitioners) who all work together to provide you with the care you need, when you need it.  We recommend signing up for the patient portal called "MyChart".  Sign up information is provided on this After Visit Summary.  MyChart is used to connect with patients for Virtual Visits (Telemedicine).  Patients are able to view lab/test results, encounter notes, upcoming appointments, etc.  Non-urgent messages can be sent to your provider as well.   To learn more about what you can do with MyChart, go to [email or web address].    Your next appointment:   5 month(s)  The  format for your next appointment:   In Person  Provider:   Robert Krasowski, MD   Other Instructions   

## 2021-04-08 NOTE — Progress Notes (Signed)
  Subjective:  Patient ID: [name], male    DOB: 08/23/1955,  MRN: [id number]  Chief Complaint  Patient presents with   vac change    F/U vac change -pt denes any pain/issues only thing is that today he noticed his foot a little swollen    65 y.o. male presents for wound care. Hx confirmed with patient. Has not had issues with the wound VAC, nurse has been changing this as directed.  Objective:  Physical Exam: Wound healing with some serous exudate. No purulence. No excessive warmth. Some fibrotic tendon ends, some granulation to the wound. No signs of acute infection today. Assessment:   1. 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 Charcot's joint of left foot    Plan:  Patient was evaluated and treated and all questions answered.  Ulcer right midfoot -Removed WVAc today. Wound rather macerated, will hold off reapplication today. Dressed with betadine and dry sterile dressing. -Given some fibrosis and the tendinous aspects of the wound I think he would benefit from routine debridement to accelerate healing. Will plan for debridement and VAC application -Patient wishes to proceed with surgical intervention. All risks, benefits, and alternatives discussed with patient. No guarantees given. Consent reviewed and signed by patient. -Planned procedures: right foot wound debridement and VAC application    No follow-ups on file.   

## 2021-04-08 NOTE — H&P (View-Only) (Signed)
  Subjective:  Patient ID: [name], male    DOB: Sep 20, 1955,  MRN: [id number]  Chief Complaint  Patient presents with   vac change    F/U vac change -pt denes any pain/issues only thing is that today he noticed his foot a little swollen    65 y.o. male presents for wound care. Hx confirmed with patient. Has not had issues with the wound VAC, nurse has been changing this as directed.  Objective:  Physical Exam: Wound healing with some serous exudate. No purulence. No excessive warmth. Some fibrotic tendon ends, some granulation to the wound. No signs of acute infection today. Assessment:   1. 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 Charcot's joint of left foot    Plan:  Patient was evaluated and treated and all questions answered.  Ulcer right midfoot -Removed WVAc today. Wound rather macerated, will hold off reapplication today. Dressed with betadine and dry sterile dressing. -Given some fibrosis and the tendinous aspects of the wound I think he would benefit from routine debridement to accelerate healing. Will plan for debridement and VAC application -Patient wishes to proceed with surgical intervention. All risks, benefits, and alternatives discussed with patient. No guarantees given. Consent reviewed and signed by patient. -Planned procedures: right foot wound debridement and VAC application    No follow-ups on file.

## 2021-04-08 NOTE — Telephone Encounter (Signed)
   Name: [name]  DOB: 01/10/1956  MRN: [id number]  Primary Cardiologist: Gypsy Balsam, MD  Chart reviewed as part of pre-operative protocol coverage. He has not been seen by cardiology since 2020. Thankfully Dr. Bing Matter was able to accommodate an appointment today (04/08/21). Will route to Dr. Bing Matter to address preop status at today's visit. Will remove from the prop pool at this time.   Pre-op covering staff: - Please add "pre-op clearance" to the appointment notes so provider is aware. - Please contact requesting surgeon's office via preferred method (i.e, phone, fax) to inform them of need for appointment prior to surgery.    Beatriz Stallion, PA-C  04/08/2021, 10:10 AM

## 2021-04-08 NOTE — Telephone Encounter (Signed)
H&P forms received, attach to his appt for Dr. Kirtland Bouchard to fill out.

## 2021-04-08 NOTE — Telephone Encounter (Signed)
   [facility] Pre-operative Risk Assessment    Patient Name: [name]  DOB: 1955/07/14 MRN: [id number]  HEARTCARE STAFF:  - IMPORTANT!!!!!! Under Visit Info/Reason for Call, type in Other and utilize the format Clearance MM/DD/YY or Clearance TBD. Do not use dashes or single digits. - Please review there is not already an duplicate clearance open for this procedure. - If request is for dental extraction, please clarify the # of teeth to be extracted. - If the patient is currently at the dentist's office, call Pre-Op Callback Staff (MA/nurse) to input urgent request.  - If the patient is not currently in the dentist office, please route to the Pre-Op pool.  Request for surgical clearance:  What type of surgery is being performed? Wound Debrivement on the left foot with application of wound vac  When is this surgery scheduled? 04/13/21  What type of clearance is required (medical clearance vs. Pharmacy clearance to hold med vs. Both)? Both   Are there any medications that need to be held prior to surgery and how long? TBD by Cardiologist  Practice name and name of physician performing surgery? [facility]; Dr. Hardie Pulley  What is the office phone number? [phone]   7.   What is the office fax number? [phone]  8.   Anesthesia type (None, local, MAC, general) ? Choice    Durel Salts 04/08/2021, 9:45 AM  _________________________________________________________________   (provider comments below)

## 2021-04-09 DIAGNOSIS — I252 Old myocardial infarction: Secondary | ICD-10-CM | POA: Diagnosis not present

## 2021-04-09 DIAGNOSIS — Z7982 Long term (current) use of aspirin: Secondary | ICD-10-CM | POA: Diagnosis not present

## 2021-04-09 DIAGNOSIS — K219 Gastro-esophageal reflux disease without esophagitis: Secondary | ICD-10-CM | POA: Diagnosis not present

## 2021-04-09 DIAGNOSIS — L97528 Non-pressure chronic ulcer of other part of left foot with other specified severity: Secondary | ICD-10-CM | POA: Diagnosis not present

## 2021-04-09 DIAGNOSIS — E1169 Type 2 diabetes mellitus with other specified complication: Secondary | ICD-10-CM | POA: Diagnosis not present

## 2021-04-09 DIAGNOSIS — E11621 Type 2 diabetes mellitus with foot ulcer: Secondary | ICD-10-CM | POA: Diagnosis not present

## 2021-04-09 DIAGNOSIS — M869 Osteomyelitis, unspecified: Secondary | ICD-10-CM | POA: Diagnosis not present

## 2021-04-09 DIAGNOSIS — D649 Anemia, unspecified: Secondary | ICD-10-CM | POA: Diagnosis not present

## 2021-04-09 DIAGNOSIS — I2510 Atherosclerotic heart disease of native coronary artery without angina pectoris: Secondary | ICD-10-CM | POA: Diagnosis not present

## 2021-04-09 DIAGNOSIS — E785 Hyperlipidemia, unspecified: Secondary | ICD-10-CM | POA: Diagnosis not present

## 2021-04-09 DIAGNOSIS — M1990 Unspecified osteoarthritis, unspecified site: Secondary | ICD-10-CM | POA: Diagnosis not present

## 2021-04-09 DIAGNOSIS — E1140 Type 2 diabetes mellitus with diabetic neuropathy, unspecified: Secondary | ICD-10-CM | POA: Diagnosis not present

## 2021-04-09 DIAGNOSIS — Z7984 Long term (current) use of oral hypoglycemic drugs: Secondary | ICD-10-CM | POA: Diagnosis not present

## 2021-04-09 DIAGNOSIS — Z794 Long term (current) use of insulin: Secondary | ICD-10-CM | POA: Diagnosis not present

## 2021-04-09 DIAGNOSIS — Z951 Presence of aortocoronary bypass graft: Secondary | ICD-10-CM | POA: Diagnosis not present

## 2021-04-09 DIAGNOSIS — I10 Essential (primary) hypertension: Secondary | ICD-10-CM | POA: Diagnosis not present

## 2021-04-09 NOTE — Progress Notes (Signed)
A1C 8.1. Results routed to Dr. Samuella Cota.

## 2021-04-09 NOTE — Progress Notes (Signed)
Anesthesia Chart Review   Case: [id number] Date/Time: 04/13/21 1800   Procedure: DEBRIDEMENT WOUND (Left) - Leave patient on stretcher   Anesthesia type: Monitor Anesthesia Care   Pre-op diagnosis: Ulcer left foot   Location: [facility] ROOM 10 / [facility]   Surgeons: Park Liter, DPM       DISCUSSION:65 y.o. never smoker with h/o DM II (A1C 8.1), GERD, HTN, CAD (CABG 2019), ulcer left foot scheduled for above procedure 04/13/21 with [name], DPM.   Pt seen by cardiology 04/08/2021. Per OV note, "He required event and had wound debridement.  It looks like his can be done under sedation with local anesthesia.  That should be no contraindication from heart point of view to do it.  Overall he is hemodynamically stable denies having any recent angina.  I advised to continue all medications as prescribed.  I see him back in my office in about 6 months but I will schedule him to have an echocardiogram to make sure his ejection fraction is known"  Anticipate pt can proceed with planned procedure barring acute status change.   VS: BP (!) 117/97   Pulse 84   Temp 36.7 C (Oral)   Resp 18   Ht 6' (1.829 m)   Wt 83.9 kg   SpO2 100%   BMI 25.09 kg/m   PROVIDERS: [name], MD is PCP   Butler Denmark, MD is Cardiologist  LABS: Labs reviewed: Acceptable for surgery. (all labs ordered are listed, but only abnormal results are displayed)  Labs Reviewed  HEMOGLOBIN A1C - Abnormal; Notable for the following components:      Result Value   Hgb A1c MFr Bld 8.1 (*)    All other components within normal limits  GLUCOSE, CAPILLARY - Abnormal; Notable for the following components:   Glucose-Capillary 182 (*)    All other components within normal limits     IMAGES:   EKG: 04/08/2021 Rate 88 bpm  NSR  CV: Echo 12/26/2017  Mitral valve: Dilated mitral annulus. Leaflet thickening/calcification  is present. Mild mitral annular calcification. Mild regurgitation.   Aortic valve: The valve  is trileaflet. Mild valve thickening present. No  stenosis. No regurgitation.   Tricuspid valve: Trace regurgitation.   Septum: Small Patent Foramen Ovale present with bi-directional shunt  visualized by color doppler and saline contrast.   Left atrium: Patent foramen ovale present with bi-directional shunting  indicated by color flow Doppler and saline contrast.   Left atrium: Spontaneous echo contrast.   Right ventricle: Normal wall thickness and ejection fraction. Cavity is  mildly dilated.  Past Medical History:  Diagnosis Date   Arthritis    Coronary artery disease    Diabetes mellitus without complication (HCC)    Type II   GERD (gastroesophageal reflux disease)    Hypertension    Myocardial infarction [facility])    "Silent"   Neuropathy     Past Surgical History:  Procedure Laterality Date   APPLICATION OF WOUND VAC Left 03/19/2021   Procedure: APPLICATION OF WOUND VAC;  Surgeon: Park Liter, DPM;  Location: [facility];  Service: Podiatry;  Laterality: Left;   CATARACT EXTRACTION Bilateral    COLONOSCOPY W/ POLYPECTOMY     CORONARY ARTERY BYPASS GRAFT N/A 12/26/2017   Procedure: CORONARY ARTERY BYPASS GRAFTING (CABG) x 3 Using Left Internal Mammary Artery (LIMA) and Edoscopically Harvested Left Leg Greater Saphenous Vein Graft (SVG); -LIMA to LAD, -SVG to LEFT CIRCUMFLEX, -SVG to PDA,;  Surgeon: Kerin Perna, MD;  Location: [facility] OR;  Service: Open Heart Surgery;  Laterality: N/A;   ENDOVEIN HARVEST OF GREATER SAPHENOUS VEIN Left 12/26/2017   Procedure: ENDOVEIN HARVEST OF GREATER SAPHENOUS VEIN: Left Thigh to below the knee;  Surgeon: Kerin Perna, MD;  Location: [facility] OR;  Service: Open Heart Surgery;  Laterality: Left;   INTRAVASCULAR PRESSURE WIRE/FFR STUDY N/A 12/19/2017   Procedure: INTRAVASCULAR PRESSURE WIRE/FFR STUDY;  Surgeon: Lyn Records, MD;  Location: [facility] INVASIVE CV LAB;  Service: Cardiovascular;  Laterality: N/A;   LEFT HEART CATH AND CORONARY ANGIOGRAPHY N/A  12/19/2017   Procedure: LEFT HEART CATH AND CORONARY ANGIOGRAPHY;  Surgeon: Lyn Records, MD;  Location: [facility] INVASIVE CV LAB;  Service: Cardiovascular;  Laterality: N/A;   TEE WITHOUT CARDIOVERSION N/A 12/26/2017   Procedure: TRANSESOPHAGEAL ECHOCARDIOGRAM (TEE);  Surgeon: Donata Clay, Theron Arista, MD;  Location: [facility] OR;  Service: Open Heart Surgery;  Laterality: N/A;   VASECTOMY     WOUND DEBRIDEMENT Left 03/16/2021   Procedure: DEBRIDEMENT LEFT FOOT WOUND;  Surgeon: Park Liter, DPM;  Location[facility];  Service: Podiatry;  Laterality: Left;   WOUND DEBRIDEMENT Left 03/19/2021   Procedure: DEBRIDEMENT WOUND;  Surgeon: Park Liter, DPM;  Location[facility];  Service: Podiatry;  Laterality: Left;    MEDICATIONS:  acetaminophen (TYLENOL) 500 MG tablet   aspirin EC 81 MG tablet   atorvastatin (LIPITOR) 80 MG tablet   calcium carbonate (TUMS - DOSED IN MG ELEMENTAL CALCIUM) 500 MG chewable tablet   cephALEXin (KEFLEX) 250 MG capsule   ibuprofen (ADVIL) 200 MG tablet   LANTUS 100 UNIT/ML injection   lisinopril (ZESTRIL) 10 MG tablet   metFORMIN (GLUCOPHAGE) 850 MG tablet   metoprolol succinate (TOPROL-XL) 50 MG 24 hr tablet   sitaGLIPtin (JANUVIA) 100 MG tablet   Tetrahydrozoline HCl (VISINE OP)   No current facility-administered medications for this encounter.     Jodell Cipro Ward, PA-C [facility]Surgical [facility] [phone]

## 2021-04-09 NOTE — Anesthesia Preprocedure Evaluation (Addendum)
Anesthesia Evaluation  Patient identified by MRN, date of birth, ID band Patient awake    Reviewed: Allergy & Precautions, H&P , NPO status , Patient's Chart, lab work & pertinent test results, reviewed documented beta blocker date and time   Airway Mallampati: II  TM Distance: >3 FB Neck ROM: Full    Dental no notable dental hx. (+) Teeth Intact, Dental Advisory Given   Pulmonary neg pulmonary ROS,    Pulmonary exam normal breath sounds clear to auscultation       Cardiovascular hypertension, Pt. on medications and Pt. on home beta blockers + CAD and + Past MI   Rhythm:Regular Rate:Normal     Neuro/Psych negative neurological ROS  negative psych ROS   GI/Hepatic Neg liver ROS, GERD  ,  Endo/Other  diabetes, Insulin Dependent, Oral Hypoglycemic Agents  Renal/GU negative Renal ROS  negative genitourinary   Musculoskeletal  (+) Arthritis , Osteoarthritis,    Abdominal   Peds  Hematology negative hematology ROS (+)   Anesthesia Other Findings   Reproductive/Obstetrics negative OB ROS                           Anesthesia Physical Anesthesia Plan  ASA: 3  Anesthesia Plan: MAC   Post-op Pain Management:    Induction: Intravenous  PONV Risk Score and Plan: 2 and Propofol infusion and Ondansetron  Airway Management Planned: Simple Face Mask  Additional Equipment:   Intra-op Plan:   Post-operative Plan:   Informed Consent: I have reviewed the patients History and Physical, chart, labs and discussed the procedure including the risks, benefits and alternatives for the proposed anesthesia with the patient or authorized representative who has indicated his/her understanding and acceptance.     Dental advisory given  Plan Discussed with: CRNA  Anesthesia Plan Comments: (See PAT note 04/08/2021, Konrad Felix Ward, PA-C)       Anesthesia Quick Evaluation

## 2021-04-12 ENCOUNTER — Telehealth: Payer: Self-pay | Admitting: Podiatry

## 2021-04-12 DIAGNOSIS — E11621 Type 2 diabetes mellitus with foot ulcer: Secondary | ICD-10-CM | POA: Diagnosis not present

## 2021-04-12 DIAGNOSIS — D649 Anemia, unspecified: Secondary | ICD-10-CM | POA: Diagnosis not present

## 2021-04-12 DIAGNOSIS — Z951 Presence of aortocoronary bypass graft: Secondary | ICD-10-CM | POA: Diagnosis not present

## 2021-04-12 DIAGNOSIS — M1990 Unspecified osteoarthritis, unspecified site: Secondary | ICD-10-CM | POA: Diagnosis not present

## 2021-04-12 DIAGNOSIS — E1169 Type 2 diabetes mellitus with other specified complication: Secondary | ICD-10-CM | POA: Diagnosis not present

## 2021-04-12 DIAGNOSIS — I252 Old myocardial infarction: Secondary | ICD-10-CM | POA: Diagnosis not present

## 2021-04-12 DIAGNOSIS — L97528 Non-pressure chronic ulcer of other part of left foot with other specified severity: Secondary | ICD-10-CM | POA: Diagnosis not present

## 2021-04-12 DIAGNOSIS — M869 Osteomyelitis, unspecified: Secondary | ICD-10-CM | POA: Diagnosis not present

## 2021-04-12 DIAGNOSIS — E1140 Type 2 diabetes mellitus with diabetic neuropathy, unspecified: Secondary | ICD-10-CM | POA: Diagnosis not present

## 2021-04-12 DIAGNOSIS — I10 Essential (primary) hypertension: Secondary | ICD-10-CM | POA: Diagnosis not present

## 2021-04-12 DIAGNOSIS — E785 Hyperlipidemia, unspecified: Secondary | ICD-10-CM | POA: Diagnosis not present

## 2021-04-12 DIAGNOSIS — Z7982 Long term (current) use of aspirin: Secondary | ICD-10-CM | POA: Diagnosis not present

## 2021-04-12 DIAGNOSIS — Z794 Long term (current) use of insulin: Secondary | ICD-10-CM | POA: Diagnosis not present

## 2021-04-12 DIAGNOSIS — K219 Gastro-esophageal reflux disease without esophagitis: Secondary | ICD-10-CM | POA: Diagnosis not present

## 2021-04-12 DIAGNOSIS — Z7984 Long term (current) use of oral hypoglycemic drugs: Secondary | ICD-10-CM | POA: Diagnosis not present

## 2021-04-12 DIAGNOSIS — I2510 Atherosclerotic heart disease of native coronary artery without angina pectoris: Secondary | ICD-10-CM | POA: Diagnosis not present

## 2021-04-12 NOTE — Telephone Encounter (Signed)
Yes please advise them to change

## 2021-04-12 NOTE — Telephone Encounter (Signed)
Pt notified/reb °

## 2021-04-12 NOTE — Telephone Encounter (Signed)
Pt wants to know if wound vac nees to be changed by [facility] today since he is having surgery tomorrow.  Also wanted to know if he would be getting another wound vac on after surgery.

## 2021-04-13 ENCOUNTER — Encounter (HOSPITAL_COMMUNITY)
Admission: RE | Disposition: A | Discharge status: Home / Self Care | Payer: Self-pay | Source: Ambulatory Visit | Attending: Podiatry

## 2021-04-13 ENCOUNTER — Ambulatory Visit (HOSPITAL_COMMUNITY): Payer: BC Managed Care – PPO | Admitting: Physician Assistant

## 2021-04-13 ENCOUNTER — Other Ambulatory Visit: Payer: Self-pay

## 2021-04-13 ENCOUNTER — Encounter (HOSPITAL_COMMUNITY): Payer: Self-pay | Admitting: Podiatry

## 2021-04-13 ENCOUNTER — Ambulatory Visit (HOSPITAL_COMMUNITY)
Admission: RE | Admit: 2021-04-13 | Discharge: 2021-04-13 | Disposition: A | Discharge status: Home / Self Care | Payer: BC Managed Care – PPO | Source: Ambulatory Visit | Attending: Podiatry | Admitting: Podiatry

## 2021-04-13 ENCOUNTER — Ambulatory Visit (HOSPITAL_COMMUNITY): Payer: BC Managed Care – PPO | Admitting: Anesthesiology

## 2021-04-13 ENCOUNTER — Telehealth: Payer: Self-pay | Admitting: Podiatry

## 2021-04-13 ENCOUNTER — Encounter: Payer: Self-pay | Admitting: Podiatry

## 2021-04-13 DIAGNOSIS — L97523 Non-pressure chronic ulcer of other part of left foot with necrosis of muscle: Secondary | ICD-10-CM | POA: Insufficient documentation

## 2021-04-13 DIAGNOSIS — Z7984 Long term (current) use of oral hypoglycemic drugs: Secondary | ICD-10-CM | POA: Diagnosis not present

## 2021-04-13 DIAGNOSIS — Z79899 Other long term (current) drug therapy: Secondary | ICD-10-CM | POA: Diagnosis not present

## 2021-04-13 DIAGNOSIS — Z794 Long term (current) use of insulin: Secondary | ICD-10-CM | POA: Insufficient documentation

## 2021-04-13 DIAGNOSIS — Z885 Allergy status to narcotic agent status: Secondary | ICD-10-CM | POA: Insufficient documentation

## 2021-04-13 DIAGNOSIS — Z7982 Long term (current) use of aspirin: Secondary | ICD-10-CM | POA: Diagnosis not present

## 2021-04-13 DIAGNOSIS — I10 Essential (primary) hypertension: Secondary | ICD-10-CM | POA: Diagnosis not present

## 2021-04-13 DIAGNOSIS — L97425 Non-pressure chronic ulcer of left heel and midfoot with muscle involvement without evidence of necrosis: Secondary | ICD-10-CM | POA: Diagnosis not present

## 2021-04-13 DIAGNOSIS — E11621 Type 2 diabetes mellitus with foot ulcer: Secondary | ICD-10-CM | POA: Insufficient documentation

## 2021-04-13 DIAGNOSIS — T8131XA Disruption of external operation (surgical) wound, not elsewhere classified, initial encounter: Secondary | ICD-10-CM | POA: Diagnosis not present

## 2021-04-13 DIAGNOSIS — L97403 Non-pressure chronic ulcer of unspecified heel and midfoot with necrosis of muscle: Secondary | ICD-10-CM

## 2021-04-13 HISTORY — PX: WOUND DEBRIDEMENT: SHX247

## 2021-04-13 LAB — GLUCOSE, CAPILLARY
Glucose-Capillary: 224 mg/dL — ABNORMAL HIGH (ref 70–99)
Glucose-Capillary: 170 mg/dL — ABNORMAL HIGH (ref 70–99)

## 2021-04-13 SURGERY — DEBRIDEMENT, WOUND
Anesthesia: Monitor Anesthesia Care | Laterality: Left

## 2021-04-13 MED ORDER — 0.9 % SODIUM CHLORIDE (POUR BTL) OPTIME
TOPICAL | Status: DC | PRN
  Administered 2021-04-13: 1000 mL

## 2021-04-13 MED ORDER — PROPOFOL 10 MG/ML IV BOLUS
INTRAVENOUS | Status: DC | PRN
  Administered 2021-04-13: 20 mg via INTRAVENOUS
  Administered 2021-04-13: 30 mg via INTRAVENOUS

## 2021-04-13 MED ORDER — FENTANYL CITRATE PF 50 MCG/ML IJ SOSY: 25.0000 ug | PREFILLED_SYRINGE | INTRAMUSCULAR | Status: DC | PRN

## 2021-04-13 MED ORDER — LIDOCAINE HCL (CARDIAC) PF 100 MG/5ML IV SOSY
PREFILLED_SYRINGE | INTRAVENOUS | Status: DC | PRN
Start: 2021-04-13 — End: 2021-04-13
  Administered 2021-04-13: 80 mg via INTRAVENOUS

## 2021-04-13 MED ORDER — MIDAZOLAM HCL 2 MG/2ML IJ SOLN
INTRAMUSCULAR | Status: DC | PRN
  Administered 2021-04-13: 2 mg via INTRAVENOUS

## 2021-04-13 MED ORDER — VANCOMYCIN HCL 1000 MG IV SOLR
INTRAVENOUS | Status: AC
  Filled 2021-04-13: qty 20

## 2021-04-13 MED ORDER — FENTANYL CITRATE (PF) 100 MCG/2ML IJ SOLN
INTRAMUSCULAR | Status: DC | PRN
  Administered 2021-04-13: 25 ug via INTRAVENOUS
  Administered 2021-04-13: 50 ug via INTRAVENOUS
  Administered 2021-04-13: 25 ug via INTRAVENOUS

## 2021-04-13 MED ORDER — VANCOMYCIN HCL 1000 MG IV SOLR
INTRAVENOUS | Status: DC | PRN
  Administered 2021-04-13: 1000 mg

## 2021-04-13 MED ORDER — CEPHALEXIN 500 MG PO CAPS: 500.0000 mg | ORAL_CAPSULE | Freq: Two times a day (BID) | ORAL | 0 refills | Status: DC

## 2021-04-13 MED ORDER — CHLORHEXIDINE GLUCONATE 0.12 % MT SOLN
15.0000 mL | Freq: Once | OROMUCOSAL | Status: AC
  Administered 2021-04-13: 15 mL via OROMUCOSAL

## 2021-04-13 MED ORDER — FENTANYL CITRATE (PF) 100 MCG/2ML IJ SOLN
INTRAMUSCULAR | Status: AC
  Filled 2021-04-13: qty 2

## 2021-04-13 MED ORDER — BUPIVACAINE HCL (PF) 0.5 % IJ SOLN
INTRAMUSCULAR | Status: DC | PRN
  Administered 2021-04-13: 10 mL

## 2021-04-13 MED ORDER — CEFAZOLIN SODIUM-DEXTROSE 2-4 GM/100ML-% IV SOLN
2.0000 g | INTRAVENOUS | Status: AC
  Administered 2021-04-13: 2 g via INTRAVENOUS
  Filled 2021-04-13: qty 100

## 2021-04-13 MED ORDER — ORAL CARE MOUTH RINSE: 15.0000 mL | Freq: Once | OROMUCOSAL | Status: AC

## 2021-04-13 MED ORDER — PROPOFOL 500 MG/50ML IV EMUL
INTRAVENOUS | Status: DC | PRN
  Administered 2021-04-13: 30 ug/kg/min via INTRAVENOUS

## 2021-04-13 MED ORDER — LACTATED RINGERS IV SOLN: INTRAVENOUS | Status: DC

## 2021-04-13 MED ORDER — SODIUM CHLORIDE 0.9 % IR SOLN
Status: DC | PRN
  Administered 2021-04-13: 3000 mL

## 2021-04-13 MED ORDER — ONDANSETRON HCL 4 MG/2ML IJ SOLN
INTRAMUSCULAR | Status: DC | PRN
  Administered 2021-04-13: 4 mg via INTRAVENOUS

## 2021-04-13 MED ORDER — MIDAZOLAM HCL 2 MG/2ML IJ SOLN
INTRAMUSCULAR | Status: AC
  Filled 2021-04-13: qty 2

## 2021-04-13 MED ORDER — BUPIVACAINE HCL (PF) 0.5 % IJ SOLN
INTRAMUSCULAR | Status: AC
  Filled 2021-04-13: qty 30

## 2021-04-13 SURGICAL SUPPLY — 68 items
BAG COUNTER SPONGE SURGICOUNT (BAG) IMPLANT
BLADE HEX COATED 2.75 (ELECTRODE) ×2 IMPLANT
BLADE OSCILLATING/SAGITTAL (BLADE)
BLADE SURG 15 STRL LF DISP TIS (BLADE) ×1 IMPLANT
BLADE SURG 15 STRL SS (BLADE) ×1
BLADE SW THK.38XMED LNG THN (BLADE) IMPLANT
BNDG CONFORM 4 STRL LF (GAUZE/BANDAGES/DRESSINGS) ×2 IMPLANT
BNDG CONFORM 6X.82 1P STRL (GAUZE/BANDAGES/DRESSINGS) ×2 IMPLANT
BNDG ELASTIC 3X5.8 VLCR STR LF (GAUZE/BANDAGES/DRESSINGS) ×2 IMPLANT
BNDG ELASTIC 4X5.8 VLCR STR LF (GAUZE/BANDAGES/DRESSINGS) ×2 IMPLANT
BNDG ELASTIC 6X5.8 VLCR STR LF (GAUZE/BANDAGES/DRESSINGS) ×2 IMPLANT
BNDG ESMARK 4X9 LF (GAUZE/BANDAGES/DRESSINGS) ×2 IMPLANT
BNDG GAUZE ELAST 4 BULKY (GAUZE/BANDAGES/DRESSINGS) ×2 IMPLANT
CHLORAPREP W/TINT 26 (MISCELLANEOUS) ×2 IMPLANT
COVER BACK TABLE 60X90IN (DRAPES) ×2 IMPLANT
CUFF TOURN SGL QUICK 18X4 (TOURNIQUET CUFF) IMPLANT
DRAPE EXTREMITY T 121X128X90 (DISPOSABLE) ×2 IMPLANT
DRAPE IMP U-DRAPE 54X76 (DRAPES) ×2 IMPLANT
DRAPE U-SHAPE 47X51 STRL (DRAPES) ×2 IMPLANT
DRSG EMULSION OIL 3X3 NADH (GAUZE/BANDAGES/DRESSINGS) ×2 IMPLANT
DRSG PAD ABDOMINAL 8X10 ST (GAUZE/BANDAGES/DRESSINGS) IMPLANT
DRSG TEGADERM 4X4.75 (GAUZE/BANDAGES/DRESSINGS) ×2 IMPLANT
DRSG VAC ATS MED SENSATRAC (GAUZE/BANDAGES/DRESSINGS) ×2 IMPLANT
ELECT REM PT RETURN 15FT ADLT (MISCELLANEOUS) ×2 IMPLANT
GAUZE 4X4 16PLY ~~LOC~~+RFID DBL (SPONGE) ×2 IMPLANT
GAUZE SPONGE 4X4 12PLY STRL (GAUZE/BANDAGES/DRESSINGS) ×2 IMPLANT
GAUZE XEROFORM 1X8 LF (GAUZE/BANDAGES/DRESSINGS) ×2 IMPLANT
GAUZE XEROFORM 5X9 LF (GAUZE/BANDAGES/DRESSINGS) IMPLANT
GLOVE SRG 8 PF TXTR STRL LF DI (GLOVE) ×1 IMPLANT
GLOVE SURG ENC MOIS LTX SZ7.5 (GLOVE) ×2 IMPLANT
GLOVE SURG NEOPR MICRO LF SZ8 (GLOVE) ×2 IMPLANT
GLOVE SURG UNDER POLY LF SZ8 (GLOVE) ×1
GOWN STRL REUS W/ TWL LRG LVL3 (GOWN DISPOSABLE) ×1 IMPLANT
GOWN STRL REUS W/TWL LRG LVL3 (GOWN DISPOSABLE) ×1
GOWN STRL REUS W/TWL XL LVL3 (GOWN DISPOSABLE) ×4 IMPLANT
HANDPIECE INTERPULSE COAX TIP (DISPOSABLE) ×1
KIT BASIN OR (CUSTOM PROCEDURE TRAY) ×2 IMPLANT
KIT TURNOVER KIT A (KITS) IMPLANT
MANIFOLD NEPTUNE II (INSTRUMENTS) ×2 IMPLANT
MATRIX PURAPLY MZ 1000MG (Tissue) ×1 IMPLANT
NEEDLE HYPO 25X1 1.5 SAFETY (NEEDLE) ×2 IMPLANT
NS IRRIG 1000ML POUR BTL (IV SOLUTION) IMPLANT
PACK ORTHO EXTREMITY (CUSTOM PROCEDURE TRAY) ×2 IMPLANT
PADDING CAST ABS 4INX4YD NS (CAST SUPPLIES) ×1
PADDING CAST ABS COTTON 4X4 ST (CAST SUPPLIES) ×1 IMPLANT
PENCIL SMOKE EVACUATOR (MISCELLANEOUS) IMPLANT
PURAPLY MZ 1000MG (Tissue) ×2 IMPLANT
SET HNDPC FAN SPRY TIP SCT (DISPOSABLE) ×1 IMPLANT
SET IRRIG Y TYPE TUR BLADDER L (SET/KITS/TRAYS/PACK) IMPLANT
SLEEVE SCD COMPRESS KNEE MED (STOCKING) ×2 IMPLANT
SPONGE SURGIFOAM ABS GEL 100 (HEMOSTASIS) IMPLANT
STAPLER VISISTAT 35W (STAPLE) ×2 IMPLANT
STOCKINETTE 8 INCH (MISCELLANEOUS) ×4 IMPLANT
SUT ETHILON 2 0 PS N (SUTURE) ×2 IMPLANT
SUT ETHILON 3 0 PS 1 (SUTURE) IMPLANT
SUT ETHILON 4 0 PS 2 18 (SUTURE) IMPLANT
SUT MNCRL AB 3-0 PS2 18 (SUTURE) IMPLANT
SUT MNCRL AB 4-0 PS2 18 (SUTURE) IMPLANT
SUT MON AB 5-0 PS2 18 (SUTURE) IMPLANT
SUT VIC AB 2-0 SH 27 (SUTURE) ×1
SUT VIC AB 2-0 SH 27X BRD (SUTURE) ×1 IMPLANT
SUT VIC AB 3-0 FS2 27 (SUTURE) ×2 IMPLANT
SUT VIC AB 4-0 PS2 18 (SUTURE) ×2 IMPLANT
SYR BULB EAR ULCER 3OZ GRN STR (SYRINGE) ×2 IMPLANT
SYR CONTROL 10ML LL (SYRINGE) ×2 IMPLANT
TOWEL OR 17X26 10 PK STRL BLUE (TOWEL DISPOSABLE) ×2 IMPLANT
UNDERPAD 30X36 HEAVY ABSORB (UNDERPADS AND DIAPERS) ×2 IMPLANT
YANKAUER SUCT BULB TIP NO VENT (SUCTIONS) ×2 IMPLANT

## 2021-04-13 NOTE — Op Note (Signed)
  Patient Name: [name] DOB: [birth year]  MRN: [id number]   Date of Surgery: 04/13/2021  Surgeon: Dr. Hardie Pulley, DPM Assistants: none  Pre-operative Diagnosis:  Ulcer left foot with necrosis of muscle and tendon Post-operative Diagnosis:  Same Procedures:  1) Debridement and irrigation of left foot wound to tendon  2) Application of skin graft substitute  3) Application of wound VAC. Pathology/Specimens: * No specimens in log * Anesthesia: MAC/local Hemostasis: * Missing tourniquet times found for documented tourniquets in log: 224825 * Estimated Blood Loss: 10 ml Materials:  Implant Name Type Inv. Item Serial No. Manufacturer Lot No. LRB No. Used Action  IMPL PURAPLY MZ 1000 - [id number] Tissue IMPL PURAPLY MZ 1000  [employer] [id number] Left 1 Implanted   Medications: 10 ml marcaine 0.5% plain; 1g Vancomycin powder Complications: none  Indications for Procedure:  This is a 65 y.o. male with a chronic left foot wound. He presents for surgical intervention to debride the wound and promote healing.   Procedure in Detail: Patient was identified in pre-operative holding area. Formal consent was signed and the left lower extremity was marked. Patient was brought back to the operating room. Anesthesia was induced. The extremity was prepped and draped in the usual sterile fashion. Timeout was taken to confirm patient name, laterality, and procedure prior to incision.   Attention was then directed to the left foot wound. The previous sutures were cut out. The wound measured 6x4. The talus was partially exposed but appeared healthy and viable. The wound was sharply excisionally debrided of non-viable tissue down to and including tendon tissue. The wound was then copiously irrigated with normal saline 3L via pulse lavage.  Puraply MZ powder and 1G vancomycin powder were mixed with a small amount of saline to form a putty. This was packed into the wound. The fresh tendon  layer was then reapproximated with 2-0 Vicryl to cover the talus bone. The resultant wound measured 6*3.5.  The wound was then covered with a wound VAC to promote granulation.   The foot was then dressed with cast padding, ACE bandage, and placed into a posterior splint. Patient tolerated the procedure well.  Disposition: Following a period of post-operative monitoring, patient will be transferred home.

## 2021-04-13 NOTE — Interval H&P Note (Signed)
History and Physical Interval Note:  04/13/2021 4:58 PM  [name]  has presented today for surgery, with the diagnosis of Ulcer left foot.  The various methods of treatment have been discussed with the patient and family. After consideration of risks, benefits and other options for treatment, the patient has consented to  Procedure(s) with comments: DEBRIDEMENT WOUND (Left) - Leave patient on stretcher as a surgical intervention.  The patient's history has been reviewed, patient examined, no change in status, stable for surgery.  I have reviewed the patient's chart and labs.  Questions were answered to the patient's satisfaction.     Park Liter

## 2021-04-13 NOTE — Transfer of Care (Signed)
Immediate Anesthesia Transfer of Care Note  Patient: [name]  Procedure(s) Performed: DEBRIDEMENT WOUND (Left)  Patient Location: PACU  Anesthesia Type:MAC  Level of Consciousness: awake, alert , oriented and patient cooperative  Airway & Oxygen Therapy: Patient Spontanous Breathing and Patient connected to face mask oxygen  Post-op Assessment: Report given to RN and Post -op Vital signs reviewed and stable  Post vital signs: Reviewed and stable  Last Vitals:  Vitals Value Taken Time  BP 129/79 04/13/21 1749  Temp    Pulse 86 04/13/21 1751  Resp 9 04/13/21 1751  SpO2 99 % 04/13/21 1751  Vitals shown include unvalidated device data.  Last Pain:  Vitals:   04/13/21 1631  TempSrc:   PainSc: 0-No pain         Complications: No notable events documented.

## 2021-04-13 NOTE — Anesthesia Postprocedure Evaluation (Signed)
Anesthesia Post Note  Patient: [name]  Procedure(s) Performed: DEBRIDEMENT WOUND (Left)     Patient location during evaluation: PACU Anesthesia Type: MAC Level of consciousness: awake and alert Pain management: pain level controlled Vital Signs Assessment: post-procedure vital signs reviewed and stable Respiratory status: spontaneous breathing, nonlabored ventilation and respiratory function stable Cardiovascular status: stable and blood pressure returned to baseline Postop Assessment: no apparent nausea or vomiting Anesthetic complications: no   No notable events documented.  Last Vitals:  Vitals:   04/13/21 1815 04/13/21 1830  BP: 136/81 140/83  Pulse: 82 82  Resp: 12 10  Temp: 36.4 C   SpO2: 100% 99%    Last Pain:  Vitals:   04/13/21 1830  TempSrc:   PainSc: 0-No pain                 Neveen Daponte,W. EDMOND

## 2021-04-13 NOTE — Telephone Encounter (Signed)
Called wife Andrey Campanile to provide post-op update

## 2021-04-13 NOTE — Discharge Instructions (Signed)
  After Surgery Instructions   1) If you are recuperating from surgery anywhere other than home, please be sure to leave us the number where you can be reached.  2) Go directly home and rest.  3) Keep the operated foot(feet) elevated six inches above the hip when sitting or lying down. This will help control swelling and pain.  4) Support the elevated foot and leg with pillows. DO NOT PLACE PILLOWS UNDER THE KNEE.  5) DO NOT REMOVE or get your bandages WET, unless you were given different instructions by your doctor to do so. This increases the risk of infection.  6) Wear your surgical shoe or surgical boot at all times when you are up on your feet.  7) A limited amount of pain and swelling may occur. The skin may take on a bruised appearance. DO NOT BE ALARMED, THIS IS NORMAL.  8) For slight pain and swelling, apply an ice pack directly over the bandages for 15 minutes only out of each hour of the day. Continue until seen in the office for your first post op visit. DO NOT APPLY ANY FORM OF HEAT TO THE AREA.  9) Have prescriptions filled immediately and take as directed.  10) Drink lots of liquids, water and juice to stay hydrated.  11) CALL IMMEDIATELY IF:  *Bleeding continues until the following day of surgery  *Pain increases and/or does not respond to medication  *Bandages or cast appears to tight  *If your bandage gets wet  *Trip, fall or stump your surgical foot  *If your temperature goes above 101  *If you have ANY questions at all  12) You are expected to be non-weightbearing after your surgery.   If you need to reach the nurse for any reason, please call: [place]: [phone] [place]: [phone] [place]: [phone]  

## 2021-04-14 ENCOUNTER — Encounter (HOSPITAL_COMMUNITY): Payer: Self-pay | Admitting: Podiatry

## 2021-04-14 DIAGNOSIS — T8131XA Disruption of external operation (surgical) wound, not elsewhere classified, initial encounter: Secondary | ICD-10-CM | POA: Diagnosis not present

## 2021-04-15 DIAGNOSIS — T8131XA Disruption of external operation (surgical) wound, not elsewhere classified, initial encounter: Secondary | ICD-10-CM | POA: Diagnosis not present

## 2021-04-16 DIAGNOSIS — I10 Essential (primary) hypertension: Secondary | ICD-10-CM | POA: Diagnosis not present

## 2021-04-16 DIAGNOSIS — E1140 Type 2 diabetes mellitus with diabetic neuropathy, unspecified: Secondary | ICD-10-CM | POA: Diagnosis not present

## 2021-04-16 DIAGNOSIS — E1169 Type 2 diabetes mellitus with other specified complication: Secondary | ICD-10-CM | POA: Diagnosis not present

## 2021-04-16 DIAGNOSIS — I252 Old myocardial infarction: Secondary | ICD-10-CM | POA: Diagnosis not present

## 2021-04-16 DIAGNOSIS — M869 Osteomyelitis, unspecified: Secondary | ICD-10-CM | POA: Diagnosis not present

## 2021-04-16 DIAGNOSIS — E11621 Type 2 diabetes mellitus with foot ulcer: Secondary | ICD-10-CM | POA: Diagnosis not present

## 2021-04-16 DIAGNOSIS — L97528 Non-pressure chronic ulcer of other part of left foot with other specified severity: Secondary | ICD-10-CM | POA: Diagnosis not present

## 2021-04-16 DIAGNOSIS — Z794 Long term (current) use of insulin: Secondary | ICD-10-CM | POA: Diagnosis not present

## 2021-04-16 DIAGNOSIS — T8131XA Disruption of external operation (surgical) wound, not elsewhere classified, initial encounter: Secondary | ICD-10-CM | POA: Diagnosis not present

## 2021-04-16 DIAGNOSIS — D649 Anemia, unspecified: Secondary | ICD-10-CM | POA: Diagnosis not present

## 2021-04-16 DIAGNOSIS — K219 Gastro-esophageal reflux disease without esophagitis: Secondary | ICD-10-CM | POA: Diagnosis not present

## 2021-04-16 DIAGNOSIS — Z951 Presence of aortocoronary bypass graft: Secondary | ICD-10-CM | POA: Diagnosis not present

## 2021-04-16 DIAGNOSIS — E785 Hyperlipidemia, unspecified: Secondary | ICD-10-CM | POA: Diagnosis not present

## 2021-04-16 DIAGNOSIS — Z7982 Long term (current) use of aspirin: Secondary | ICD-10-CM | POA: Diagnosis not present

## 2021-04-16 DIAGNOSIS — I2510 Atherosclerotic heart disease of native coronary artery without angina pectoris: Secondary | ICD-10-CM | POA: Diagnosis not present

## 2021-04-16 DIAGNOSIS — M1990 Unspecified osteoarthritis, unspecified site: Secondary | ICD-10-CM | POA: Diagnosis not present

## 2021-04-16 DIAGNOSIS — Z7984 Long term (current) use of oral hypoglycemic drugs: Secondary | ICD-10-CM | POA: Diagnosis not present

## 2021-04-17 DIAGNOSIS — T8131XA Disruption of external operation (surgical) wound, not elsewhere classified, initial encounter: Secondary | ICD-10-CM | POA: Diagnosis not present

## 2021-04-18 DIAGNOSIS — T8131XA Disruption of external operation (surgical) wound, not elsewhere classified, initial encounter: Secondary | ICD-10-CM | POA: Diagnosis not present

## 2021-04-19 ENCOUNTER — Other Ambulatory Visit: Payer: Self-pay

## 2021-04-19 ENCOUNTER — Ambulatory Visit (INDEPENDENT_AMBULATORY_CARE_PROVIDER_SITE_OTHER): Payer: BC Managed Care – PPO | Admitting: Podiatry

## 2021-04-19 DIAGNOSIS — L97423 Non-pressure chronic ulcer of left heel and midfoot with necrosis of muscle: Secondary | ICD-10-CM

## 2021-04-19 DIAGNOSIS — E08621 Diabetes mellitus due to underlying condition with foot ulcer: Secondary | ICD-10-CM | POA: Diagnosis not present

## 2021-04-19 DIAGNOSIS — E11621 Type 2 diabetes mellitus with foot ulcer: Secondary | ICD-10-CM

## 2021-04-19 DIAGNOSIS — T8131XA Disruption of external operation (surgical) wound, not elsewhere classified, initial encounter: Secondary | ICD-10-CM | POA: Diagnosis not present

## 2021-04-19 NOTE — Progress Notes (Signed)
  Subjective:  Patient ID: [name], male    DOB: December 26, 1955,  MRN: [id number]  No chief complaint on file.  65 y.o. male presents for wound care. Hx confirmed with patient. Has not had issues with the wound VAC, nurse has been changing this as directed.  Objective:  Physical Exam: Left foot warm and well perfused. Wound medial arch at navicular area measures 5*4 today with exposed tendon and talus bone. Bone appears viable. No purulence noted. No warmth or erythema, no ascending cellulitis. Mild serous drainage only. Mild midfoot edema, no warmth or erythema Assessment:   1. 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  Ulcer right midfoot -Ulcer is now >55 weeks old, and has been treated by this provider for >4 weeks. Wound is now chronic and has failed to respond to the standard wound care treatment methods of offloading, splinting, Wound VAC therapy, operative and non-operative debridement, and PO abx. Patient does not smoke, thus no smoking counseling required. Recommending evaluation for coverage for Puraply AM or XT, as an effective wound barrier to resist microbial colonization and support healing. This will allow [place] to promote additional granulation tissue and inhibit bioburden and biofilm reformation and provide a collagen base to support patients wound healing. Reviewed risks and complications of procedure with patient and he/she has agreed. Patient is monitored by his PCP for his DM.  -Wound cleansed, no debridement indicated today. He does have a small amount of exposed talus bone, but it does not appear devitalized. He would benefit from continued VAC therapy, additionally benefit from skin graft substitute for the reasons stated above for this high risk wound.  -Prisma packed into the wound today, VAC reapplied. Continue VAC changes MWF.   -Immobilize in CAM boot instead of splint. Dispense CAM boot today.  No follow-ups on file.

## 2021-04-20 ENCOUNTER — Telehealth: Payer: Self-pay | Admitting: Podiatry

## 2021-04-20 ENCOUNTER — Telehealth: Payer: Self-pay

## 2021-04-20 DIAGNOSIS — K219 Gastro-esophageal reflux disease without esophagitis: Secondary | ICD-10-CM | POA: Diagnosis not present

## 2021-04-20 DIAGNOSIS — Z951 Presence of aortocoronary bypass graft: Secondary | ICD-10-CM | POA: Diagnosis not present

## 2021-04-20 DIAGNOSIS — I252 Old myocardial infarction: Secondary | ICD-10-CM | POA: Diagnosis not present

## 2021-04-20 DIAGNOSIS — E1169 Type 2 diabetes mellitus with other specified complication: Secondary | ICD-10-CM | POA: Diagnosis not present

## 2021-04-20 DIAGNOSIS — M869 Osteomyelitis, unspecified: Secondary | ICD-10-CM | POA: Diagnosis not present

## 2021-04-20 DIAGNOSIS — Z794 Long term (current) use of insulin: Secondary | ICD-10-CM | POA: Diagnosis not present

## 2021-04-20 DIAGNOSIS — I10 Essential (primary) hypertension: Secondary | ICD-10-CM | POA: Diagnosis not present

## 2021-04-20 DIAGNOSIS — E1140 Type 2 diabetes mellitus with diabetic neuropathy, unspecified: Secondary | ICD-10-CM | POA: Diagnosis not present

## 2021-04-20 DIAGNOSIS — E785 Hyperlipidemia, unspecified: Secondary | ICD-10-CM | POA: Diagnosis not present

## 2021-04-20 DIAGNOSIS — M1990 Unspecified osteoarthritis, unspecified site: Secondary | ICD-10-CM | POA: Diagnosis not present

## 2021-04-20 DIAGNOSIS — Z7982 Long term (current) use of aspirin: Secondary | ICD-10-CM | POA: Diagnosis not present

## 2021-04-20 DIAGNOSIS — T8131XA Disruption of external operation (surgical) wound, not elsewhere classified, initial encounter: Secondary | ICD-10-CM | POA: Diagnosis not present

## 2021-04-20 DIAGNOSIS — Z7984 Long term (current) use of oral hypoglycemic drugs: Secondary | ICD-10-CM | POA: Diagnosis not present

## 2021-04-20 DIAGNOSIS — L97528 Non-pressure chronic ulcer of other part of left foot with other specified severity: Secondary | ICD-10-CM | POA: Diagnosis not present

## 2021-04-20 DIAGNOSIS — E11621 Type 2 diabetes mellitus with foot ulcer: Secondary | ICD-10-CM | POA: Diagnosis not present

## 2021-04-20 DIAGNOSIS — I2510 Atherosclerotic heart disease of native coronary artery without angina pectoris: Secondary | ICD-10-CM | POA: Diagnosis not present

## 2021-04-20 DIAGNOSIS — D649 Anemia, unspecified: Secondary | ICD-10-CM | POA: Diagnosis not present

## 2021-04-20 NOTE — Telephone Encounter (Signed)
Contacted [facility] back and relayed Dr. Kandice Hams verbal approval to reaply vac twice weekly

## 2021-04-20 NOTE — Telephone Encounter (Signed)
[facility] home nurse called stating pt called last night stating his vac stopped working. Pt was instructed by home nurse to perform we to dry dressing. Sharyl Nimrod is requesting verbal orders to reapply vac today. They need PRN verbal orders, please advice

## 2021-04-20 NOTE — Telephone Encounter (Signed)
Please provide verbal orders to reapply VAC twice weekly

## 2021-04-20 NOTE — Telephone Encounter (Signed)
Received page from answering service, had issue with wound VAC not holding suction.  They spoke with [facility] nursing and they are coming today to change the VAC, he remove the VAC dressing and put a wet-to-dry saline dressing on which is what the home nurse had recommended

## 2021-04-21 ENCOUNTER — Other Ambulatory Visit: Payer: BC Managed Care – PPO

## 2021-04-21 DIAGNOSIS — T8131XA Disruption of external operation (surgical) wound, not elsewhere classified, initial encounter: Secondary | ICD-10-CM | POA: Diagnosis not present

## 2021-04-22 DIAGNOSIS — L97523 Non-pressure chronic ulcer of other part of left foot with necrosis of muscle: Secondary | ICD-10-CM | POA: Diagnosis not present

## 2021-04-22 DIAGNOSIS — Z794 Long term (current) use of insulin: Secondary | ICD-10-CM | POA: Diagnosis not present

## 2021-04-22 DIAGNOSIS — T8131XA Disruption of external operation (surgical) wound, not elsewhere classified, initial encounter: Secondary | ICD-10-CM | POA: Diagnosis not present

## 2021-04-22 DIAGNOSIS — E11621 Type 2 diabetes mellitus with foot ulcer: Secondary | ICD-10-CM | POA: Diagnosis not present

## 2021-04-23 DIAGNOSIS — L97528 Non-pressure chronic ulcer of other part of left foot with other specified severity: Secondary | ICD-10-CM | POA: Diagnosis not present

## 2021-04-23 DIAGNOSIS — K219 Gastro-esophageal reflux disease without esophagitis: Secondary | ICD-10-CM | POA: Diagnosis not present

## 2021-04-23 DIAGNOSIS — D649 Anemia, unspecified: Secondary | ICD-10-CM | POA: Diagnosis not present

## 2021-04-23 DIAGNOSIS — Z951 Presence of aortocoronary bypass graft: Secondary | ICD-10-CM | POA: Diagnosis not present

## 2021-04-23 DIAGNOSIS — I252 Old myocardial infarction: Secondary | ICD-10-CM | POA: Diagnosis not present

## 2021-04-23 DIAGNOSIS — M869 Osteomyelitis, unspecified: Secondary | ICD-10-CM | POA: Diagnosis not present

## 2021-04-23 DIAGNOSIS — M1990 Unspecified osteoarthritis, unspecified site: Secondary | ICD-10-CM | POA: Diagnosis not present

## 2021-04-23 DIAGNOSIS — E11621 Type 2 diabetes mellitus with foot ulcer: Secondary | ICD-10-CM | POA: Diagnosis not present

## 2021-04-23 DIAGNOSIS — Z794 Long term (current) use of insulin: Secondary | ICD-10-CM | POA: Diagnosis not present

## 2021-04-23 DIAGNOSIS — Z7982 Long term (current) use of aspirin: Secondary | ICD-10-CM | POA: Diagnosis not present

## 2021-04-23 DIAGNOSIS — I10 Essential (primary) hypertension: Secondary | ICD-10-CM | POA: Diagnosis not present

## 2021-04-23 DIAGNOSIS — T8131XA Disruption of external operation (surgical) wound, not elsewhere classified, initial encounter: Secondary | ICD-10-CM | POA: Diagnosis not present

## 2021-04-23 DIAGNOSIS — E1140 Type 2 diabetes mellitus with diabetic neuropathy, unspecified: Secondary | ICD-10-CM | POA: Diagnosis not present

## 2021-04-23 DIAGNOSIS — I2510 Atherosclerotic heart disease of native coronary artery without angina pectoris: Secondary | ICD-10-CM | POA: Diagnosis not present

## 2021-04-23 DIAGNOSIS — E785 Hyperlipidemia, unspecified: Secondary | ICD-10-CM | POA: Diagnosis not present

## 2021-04-23 DIAGNOSIS — Z7984 Long term (current) use of oral hypoglycemic drugs: Secondary | ICD-10-CM | POA: Diagnosis not present

## 2021-04-23 DIAGNOSIS — E1169 Type 2 diabetes mellitus with other specified complication: Secondary | ICD-10-CM | POA: Diagnosis not present

## 2021-04-24 DIAGNOSIS — T8131XA Disruption of external operation (surgical) wound, not elsewhere classified, initial encounter: Secondary | ICD-10-CM | POA: Diagnosis not present

## 2021-04-25 DIAGNOSIS — T8131XA Disruption of external operation (surgical) wound, not elsewhere classified, initial encounter: Secondary | ICD-10-CM | POA: Diagnosis not present

## 2021-04-26 ENCOUNTER — Other Ambulatory Visit: Payer: Self-pay

## 2021-04-26 ENCOUNTER — Ambulatory Visit (INDEPENDENT_AMBULATORY_CARE_PROVIDER_SITE_OTHER): Payer: BC Managed Care – PPO | Admitting: Podiatry

## 2021-04-26 DIAGNOSIS — M1990 Unspecified osteoarthritis, unspecified site: Secondary | ICD-10-CM | POA: Diagnosis not present

## 2021-04-26 DIAGNOSIS — I10 Essential (primary) hypertension: Secondary | ICD-10-CM | POA: Diagnosis not present

## 2021-04-26 DIAGNOSIS — E1169 Type 2 diabetes mellitus with other specified complication: Secondary | ICD-10-CM | POA: Diagnosis not present

## 2021-04-26 DIAGNOSIS — Z951 Presence of aortocoronary bypass graft: Secondary | ICD-10-CM | POA: Diagnosis not present

## 2021-04-26 DIAGNOSIS — Z7984 Long term (current) use of oral hypoglycemic drugs: Secondary | ICD-10-CM | POA: Diagnosis not present

## 2021-04-26 DIAGNOSIS — E1140 Type 2 diabetes mellitus with diabetic neuropathy, unspecified: Secondary | ICD-10-CM | POA: Diagnosis not present

## 2021-04-26 DIAGNOSIS — I2510 Atherosclerotic heart disease of native coronary artery without angina pectoris: Secondary | ICD-10-CM | POA: Diagnosis not present

## 2021-04-26 DIAGNOSIS — K219 Gastro-esophageal reflux disease without esophagitis: Secondary | ICD-10-CM | POA: Diagnosis not present

## 2021-04-26 DIAGNOSIS — Z794 Long term (current) use of insulin: Secondary | ICD-10-CM | POA: Diagnosis not present

## 2021-04-26 DIAGNOSIS — Z7982 Long term (current) use of aspirin: Secondary | ICD-10-CM | POA: Diagnosis not present

## 2021-04-26 DIAGNOSIS — E11621 Type 2 diabetes mellitus with foot ulcer: Secondary | ICD-10-CM | POA: Diagnosis not present

## 2021-04-26 DIAGNOSIS — L97423 Non-pressure chronic ulcer of left heel and midfoot with necrosis of muscle: Secondary | ICD-10-CM

## 2021-04-26 DIAGNOSIS — T8131XA Disruption of external operation (surgical) wound, not elsewhere classified, initial encounter: Secondary | ICD-10-CM | POA: Diagnosis not present

## 2021-04-26 DIAGNOSIS — M869 Osteomyelitis, unspecified: Secondary | ICD-10-CM | POA: Diagnosis not present

## 2021-04-26 DIAGNOSIS — E785 Hyperlipidemia, unspecified: Secondary | ICD-10-CM | POA: Diagnosis not present

## 2021-04-26 DIAGNOSIS — I252 Old myocardial infarction: Secondary | ICD-10-CM | POA: Diagnosis not present

## 2021-04-26 DIAGNOSIS — L97528 Non-pressure chronic ulcer of other part of left foot with other specified severity: Secondary | ICD-10-CM | POA: Diagnosis not present

## 2021-04-26 DIAGNOSIS — D649 Anemia, unspecified: Secondary | ICD-10-CM | POA: Diagnosis not present

## 2021-04-26 NOTE — Progress Notes (Signed)
  Subjective:  Patient ID: [name], male    DOB: 1956/05/18,  MRN: [id number]  Chief Complaint  Patient presents with   vac change    F/U vac change -pt denies any issues with vac or wound -Tx: vac change  2 x wkk    65 y.o. male presents for wound care. Hx confirmed with patient. Denies new issues, no issues with the VAC. Does think the boot is heavy and it makes it harder to sleep. Objective:  Physical Exam: Left foot warm and well perfused. Wound medial arch at navicular area measures 4.5*4 today with exposed tendon and partial talus bone. Bone appears viable. No purulence noted. No warmth or erythema, no ascending cellulitis. Mild serous drainage only. Mild midfoot edema, no warmth or erythema Assessment:   1. 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  Ulcer right midfoot -Wound well appearing today.  -Less exposed bone today, more granulation tissue forming over this area. This area is high risk so we hope for accelerated growth over the bone. Pending approval for office application of Apligraf. -VAC reapplied today. Continue VAC changes MWF.  -Immobilize in CAM boot instead of splint.    No follow-ups on file.

## 2021-04-27 DIAGNOSIS — T8131XA Disruption of external operation (surgical) wound, not elsewhere classified, initial encounter: Secondary | ICD-10-CM | POA: Diagnosis not present

## 2021-04-28 DIAGNOSIS — E11621 Type 2 diabetes mellitus with foot ulcer: Secondary | ICD-10-CM | POA: Diagnosis not present

## 2021-04-28 DIAGNOSIS — D649 Anemia, unspecified: Secondary | ICD-10-CM | POA: Diagnosis not present

## 2021-04-28 DIAGNOSIS — E785 Hyperlipidemia, unspecified: Secondary | ICD-10-CM | POA: Diagnosis not present

## 2021-04-28 DIAGNOSIS — Z951 Presence of aortocoronary bypass graft: Secondary | ICD-10-CM | POA: Diagnosis not present

## 2021-04-28 DIAGNOSIS — K219 Gastro-esophageal reflux disease without esophagitis: Secondary | ICD-10-CM | POA: Diagnosis not present

## 2021-04-28 DIAGNOSIS — T8131XA Disruption of external operation (surgical) wound, not elsewhere classified, initial encounter: Secondary | ICD-10-CM | POA: Diagnosis not present

## 2021-04-28 DIAGNOSIS — E1140 Type 2 diabetes mellitus with diabetic neuropathy, unspecified: Secondary | ICD-10-CM | POA: Diagnosis not present

## 2021-04-28 DIAGNOSIS — I252 Old myocardial infarction: Secondary | ICD-10-CM | POA: Diagnosis not present

## 2021-04-28 DIAGNOSIS — Z7984 Long term (current) use of oral hypoglycemic drugs: Secondary | ICD-10-CM | POA: Diagnosis not present

## 2021-04-28 DIAGNOSIS — M1990 Unspecified osteoarthritis, unspecified site: Secondary | ICD-10-CM | POA: Diagnosis not present

## 2021-04-28 DIAGNOSIS — Z794 Long term (current) use of insulin: Secondary | ICD-10-CM | POA: Diagnosis not present

## 2021-04-28 DIAGNOSIS — L97528 Non-pressure chronic ulcer of other part of left foot with other specified severity: Secondary | ICD-10-CM | POA: Diagnosis not present

## 2021-04-28 DIAGNOSIS — I2510 Atherosclerotic heart disease of native coronary artery without angina pectoris: Secondary | ICD-10-CM | POA: Diagnosis not present

## 2021-04-28 DIAGNOSIS — M869 Osteomyelitis, unspecified: Secondary | ICD-10-CM | POA: Diagnosis not present

## 2021-04-28 DIAGNOSIS — E1169 Type 2 diabetes mellitus with other specified complication: Secondary | ICD-10-CM | POA: Diagnosis not present

## 2021-04-28 DIAGNOSIS — I10 Essential (primary) hypertension: Secondary | ICD-10-CM | POA: Diagnosis not present

## 2021-04-28 DIAGNOSIS — Z7982 Long term (current) use of aspirin: Secondary | ICD-10-CM | POA: Diagnosis not present

## 2021-04-29 DIAGNOSIS — T8131XA Disruption of external operation (surgical) wound, not elsewhere classified, initial encounter: Secondary | ICD-10-CM | POA: Diagnosis not present

## 2021-04-30 ENCOUNTER — Encounter: Payer: Self-pay | Admitting: Podiatry

## 2021-04-30 ENCOUNTER — Telehealth: Payer: Self-pay | Admitting: Podiatry

## 2021-04-30 DIAGNOSIS — E1140 Type 2 diabetes mellitus with diabetic neuropathy, unspecified: Secondary | ICD-10-CM | POA: Diagnosis not present

## 2021-04-30 DIAGNOSIS — E785 Hyperlipidemia, unspecified: Secondary | ICD-10-CM | POA: Diagnosis not present

## 2021-04-30 DIAGNOSIS — M1990 Unspecified osteoarthritis, unspecified site: Secondary | ICD-10-CM | POA: Diagnosis not present

## 2021-04-30 DIAGNOSIS — T8131XA Disruption of external operation (surgical) wound, not elsewhere classified, initial encounter: Secondary | ICD-10-CM | POA: Diagnosis not present

## 2021-04-30 DIAGNOSIS — K219 Gastro-esophageal reflux disease without esophagitis: Secondary | ICD-10-CM | POA: Diagnosis not present

## 2021-04-30 DIAGNOSIS — E11621 Type 2 diabetes mellitus with foot ulcer: Secondary | ICD-10-CM | POA: Diagnosis not present

## 2021-04-30 DIAGNOSIS — I10 Essential (primary) hypertension: Secondary | ICD-10-CM | POA: Diagnosis not present

## 2021-04-30 DIAGNOSIS — M869 Osteomyelitis, unspecified: Secondary | ICD-10-CM | POA: Diagnosis not present

## 2021-04-30 DIAGNOSIS — I252 Old myocardial infarction: Secondary | ICD-10-CM | POA: Diagnosis not present

## 2021-04-30 DIAGNOSIS — Z7982 Long term (current) use of aspirin: Secondary | ICD-10-CM | POA: Diagnosis not present

## 2021-04-30 DIAGNOSIS — Z951 Presence of aortocoronary bypass graft: Secondary | ICD-10-CM | POA: Diagnosis not present

## 2021-04-30 DIAGNOSIS — Z794 Long term (current) use of insulin: Secondary | ICD-10-CM | POA: Diagnosis not present

## 2021-04-30 DIAGNOSIS — I2510 Atherosclerotic heart disease of native coronary artery without angina pectoris: Secondary | ICD-10-CM | POA: Diagnosis not present

## 2021-04-30 DIAGNOSIS — L97528 Non-pressure chronic ulcer of other part of left foot with other specified severity: Secondary | ICD-10-CM | POA: Diagnosis not present

## 2021-04-30 DIAGNOSIS — E1169 Type 2 diabetes mellitus with other specified complication: Secondary | ICD-10-CM | POA: Diagnosis not present

## 2021-04-30 DIAGNOSIS — D649 Anemia, unspecified: Secondary | ICD-10-CM | POA: Diagnosis not present

## 2021-04-30 DIAGNOSIS — Z7984 Long term (current) use of oral hypoglycemic drugs: Secondary | ICD-10-CM | POA: Diagnosis not present

## 2021-04-30 NOTE — Telephone Encounter (Signed)
Redness looks ok  based on this picture looks more like irritation rather than infection. Please inform

## 2021-04-30 NOTE — Telephone Encounter (Signed)
Attempted to call patient, no answer 

## 2021-04-30 NOTE — Telephone Encounter (Signed)
[name] with [name] is concerned about redness.  Pls advise

## 2021-04-30 NOTE — Telephone Encounter (Signed)
Spoke with [name] and notified.  Pt already had f/u on 05-03-21/reb

## 2021-05-01 DIAGNOSIS — T8131XA Disruption of external operation (surgical) wound, not elsewhere classified, initial encounter: Secondary | ICD-10-CM | POA: Diagnosis not present

## 2021-05-02 DIAGNOSIS — T8131XA Disruption of external operation (surgical) wound, not elsewhere classified, initial encounter: Secondary | ICD-10-CM | POA: Diagnosis not present

## 2021-05-03 ENCOUNTER — Ambulatory Visit (INDEPENDENT_AMBULATORY_CARE_PROVIDER_SITE_OTHER): Payer: BC Managed Care – PPO | Admitting: Podiatry

## 2021-05-03 DIAGNOSIS — T8131XA Disruption of external operation (surgical) wound, not elsewhere classified, initial encounter: Secondary | ICD-10-CM | POA: Diagnosis not present

## 2021-05-03 DIAGNOSIS — E11621 Type 2 diabetes mellitus with foot ulcer: Secondary | ICD-10-CM

## 2021-05-03 DIAGNOSIS — L97423 Non-pressure chronic ulcer of left heel and midfoot with necrosis of muscle: Secondary | ICD-10-CM | POA: Diagnosis not present

## 2021-05-03 NOTE — Progress Notes (Signed)
  Subjective:  Patient ID: [name], male    DOB: 1955/08/27,  MRN: [id number]  Chief Complaint  Patient presents with   vac change    F/U wound vac chyange and graft application -pt denies any issues    65 y.o. male presents for wound care. Hx confirmed with patient. Denies new issues. Previously had a bit of redness his nurse was concerned about, but no other issues. Objective:  Physical Exam: Left foot warm and well perfused. Wound medial arch at navicular area measures 4.5*4.5 today with exposed tendon and partial talus bone measuring 2.5x2. Bone appears viable. No purulence noted. No warmth or erythema, no ascending cellulitis. Mild serous drainage only. Mild midfoot edema, no warmth or erythema Assessment:   1. 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  Ulcer left midfoot -Redness likely prior irritation rather than infection. The wound VAC was well padded to avoid further irritation. -Wound thoroughly cleansed. -Skin graft substitute applied to promote healing. This is medically necessary for accelerated healing given bone exposure and failure of conventional therapy to promote closure.  Procedure: Application Skin Graft Substitute Rationale: Wound in need of advanced wound therapy to accelerate healing Pre-Debridement Wound Measurements: 4.5 cm x 4.5 cm x 0.3 cm  Post-Debridement Wound Measurements: same as pre-debridement. Type of Debridement: Selective Tissue Removed: Devitalized soft-tissue Instrumentation: 3 mm dermal curette Skin substitute: Apligraf    Lot #: [id number]    Expiration: 05/12/21 Hydration: none Secondary Dressing: adaptic, steri strips, Wound VAC reapplied. Disposition: Patient tolerated procedure well. Patient to return in 1 week for follow-up.  Procedure: Wound VAC Application Location: left  midfoot Wound Measurement: 4.5 cm x 4.5 cm x 0.3 cm  Technique: Black foam to wound base, followed by adherent dressing. Set to 125 mmHg with good seal noted. Disposition: Patient tolerated procedure well.   No follow-ups on file.

## 2021-05-04 DIAGNOSIS — T8131XA Disruption of external operation (surgical) wound, not elsewhere classified, initial encounter: Secondary | ICD-10-CM | POA: Diagnosis not present

## 2021-05-05 DIAGNOSIS — E11621 Type 2 diabetes mellitus with foot ulcer: Secondary | ICD-10-CM | POA: Diagnosis not present

## 2021-05-05 DIAGNOSIS — E785 Hyperlipidemia, unspecified: Secondary | ICD-10-CM | POA: Diagnosis not present

## 2021-05-05 DIAGNOSIS — K219 Gastro-esophageal reflux disease without esophagitis: Secondary | ICD-10-CM | POA: Diagnosis not present

## 2021-05-05 DIAGNOSIS — T8131XA Disruption of external operation (surgical) wound, not elsewhere classified, initial encounter: Secondary | ICD-10-CM | POA: Diagnosis not present

## 2021-05-05 DIAGNOSIS — Z794 Long term (current) use of insulin: Secondary | ICD-10-CM | POA: Diagnosis not present

## 2021-05-05 DIAGNOSIS — D649 Anemia, unspecified: Secondary | ICD-10-CM | POA: Diagnosis not present

## 2021-05-05 DIAGNOSIS — Z951 Presence of aortocoronary bypass graft: Secondary | ICD-10-CM | POA: Diagnosis not present

## 2021-05-05 DIAGNOSIS — I10 Essential (primary) hypertension: Secondary | ICD-10-CM | POA: Diagnosis not present

## 2021-05-05 DIAGNOSIS — E1140 Type 2 diabetes mellitus with diabetic neuropathy, unspecified: Secondary | ICD-10-CM | POA: Diagnosis not present

## 2021-05-05 DIAGNOSIS — Z7984 Long term (current) use of oral hypoglycemic drugs: Secondary | ICD-10-CM | POA: Diagnosis not present

## 2021-05-05 DIAGNOSIS — M1990 Unspecified osteoarthritis, unspecified site: Secondary | ICD-10-CM | POA: Diagnosis not present

## 2021-05-05 DIAGNOSIS — L97528 Non-pressure chronic ulcer of other part of left foot with other specified severity: Secondary | ICD-10-CM | POA: Diagnosis not present

## 2021-05-05 DIAGNOSIS — M869 Osteomyelitis, unspecified: Secondary | ICD-10-CM | POA: Diagnosis not present

## 2021-05-05 DIAGNOSIS — I252 Old myocardial infarction: Secondary | ICD-10-CM | POA: Diagnosis not present

## 2021-05-05 DIAGNOSIS — Z7982 Long term (current) use of aspirin: Secondary | ICD-10-CM | POA: Diagnosis not present

## 2021-05-05 DIAGNOSIS — E1169 Type 2 diabetes mellitus with other specified complication: Secondary | ICD-10-CM | POA: Diagnosis not present

## 2021-05-05 DIAGNOSIS — I2510 Atherosclerotic heart disease of native coronary artery without angina pectoris: Secondary | ICD-10-CM | POA: Diagnosis not present

## 2021-05-06 DIAGNOSIS — T8131XA Disruption of external operation (surgical) wound, not elsewhere classified, initial encounter: Secondary | ICD-10-CM | POA: Diagnosis not present

## 2021-05-07 DIAGNOSIS — L97528 Non-pressure chronic ulcer of other part of left foot with other specified severity: Secondary | ICD-10-CM | POA: Diagnosis not present

## 2021-05-07 DIAGNOSIS — D649 Anemia, unspecified: Secondary | ICD-10-CM | POA: Diagnosis not present

## 2021-05-07 DIAGNOSIS — M869 Osteomyelitis, unspecified: Secondary | ICD-10-CM | POA: Diagnosis not present

## 2021-05-07 DIAGNOSIS — Z7982 Long term (current) use of aspirin: Secondary | ICD-10-CM | POA: Diagnosis not present

## 2021-05-07 DIAGNOSIS — I252 Old myocardial infarction: Secondary | ICD-10-CM | POA: Diagnosis not present

## 2021-05-07 DIAGNOSIS — E785 Hyperlipidemia, unspecified: Secondary | ICD-10-CM | POA: Diagnosis not present

## 2021-05-07 DIAGNOSIS — E1169 Type 2 diabetes mellitus with other specified complication: Secondary | ICD-10-CM | POA: Diagnosis not present

## 2021-05-07 DIAGNOSIS — M1990 Unspecified osteoarthritis, unspecified site: Secondary | ICD-10-CM | POA: Diagnosis not present

## 2021-05-07 DIAGNOSIS — E11621 Type 2 diabetes mellitus with foot ulcer: Secondary | ICD-10-CM | POA: Diagnosis not present

## 2021-05-07 DIAGNOSIS — Z794 Long term (current) use of insulin: Secondary | ICD-10-CM | POA: Diagnosis not present

## 2021-05-07 DIAGNOSIS — I10 Essential (primary) hypertension: Secondary | ICD-10-CM | POA: Diagnosis not present

## 2021-05-07 DIAGNOSIS — T8131XA Disruption of external operation (surgical) wound, not elsewhere classified, initial encounter: Secondary | ICD-10-CM | POA: Diagnosis not present

## 2021-05-07 DIAGNOSIS — K219 Gastro-esophageal reflux disease without esophagitis: Secondary | ICD-10-CM | POA: Diagnosis not present

## 2021-05-07 DIAGNOSIS — I2510 Atherosclerotic heart disease of native coronary artery without angina pectoris: Secondary | ICD-10-CM | POA: Diagnosis not present

## 2021-05-07 DIAGNOSIS — E1140 Type 2 diabetes mellitus with diabetic neuropathy, unspecified: Secondary | ICD-10-CM | POA: Diagnosis not present

## 2021-05-07 DIAGNOSIS — Z7984 Long term (current) use of oral hypoglycemic drugs: Secondary | ICD-10-CM | POA: Diagnosis not present

## 2021-05-07 DIAGNOSIS — Z951 Presence of aortocoronary bypass graft: Secondary | ICD-10-CM | POA: Diagnosis not present

## 2021-05-08 DIAGNOSIS — T8131XA Disruption of external operation (surgical) wound, not elsewhere classified, initial encounter: Secondary | ICD-10-CM | POA: Diagnosis not present

## 2021-05-09 DIAGNOSIS — T8131XA Disruption of external operation (surgical) wound, not elsewhere classified, initial encounter: Secondary | ICD-10-CM | POA: Diagnosis not present

## 2021-05-10 ENCOUNTER — Telehealth: Payer: Self-pay

## 2021-05-10 DIAGNOSIS — E11621 Type 2 diabetes mellitus with foot ulcer: Secondary | ICD-10-CM | POA: Diagnosis not present

## 2021-05-10 DIAGNOSIS — Z7984 Long term (current) use of oral hypoglycemic drugs: Secondary | ICD-10-CM | POA: Diagnosis not present

## 2021-05-10 DIAGNOSIS — I10 Essential (primary) hypertension: Secondary | ICD-10-CM | POA: Diagnosis not present

## 2021-05-10 DIAGNOSIS — Z794 Long term (current) use of insulin: Secondary | ICD-10-CM | POA: Diagnosis not present

## 2021-05-10 DIAGNOSIS — D649 Anemia, unspecified: Secondary | ICD-10-CM | POA: Diagnosis not present

## 2021-05-10 DIAGNOSIS — K219 Gastro-esophageal reflux disease without esophagitis: Secondary | ICD-10-CM | POA: Diagnosis not present

## 2021-05-10 DIAGNOSIS — M869 Osteomyelitis, unspecified: Secondary | ICD-10-CM | POA: Diagnosis not present

## 2021-05-10 DIAGNOSIS — I2510 Atherosclerotic heart disease of native coronary artery without angina pectoris: Secondary | ICD-10-CM | POA: Diagnosis not present

## 2021-05-10 DIAGNOSIS — Z7982 Long term (current) use of aspirin: Secondary | ICD-10-CM | POA: Diagnosis not present

## 2021-05-10 DIAGNOSIS — L97528 Non-pressure chronic ulcer of other part of left foot with other specified severity: Secondary | ICD-10-CM | POA: Diagnosis not present

## 2021-05-10 DIAGNOSIS — M1990 Unspecified osteoarthritis, unspecified site: Secondary | ICD-10-CM | POA: Diagnosis not present

## 2021-05-10 DIAGNOSIS — E1169 Type 2 diabetes mellitus with other specified complication: Secondary | ICD-10-CM | POA: Diagnosis not present

## 2021-05-10 DIAGNOSIS — Z951 Presence of aortocoronary bypass graft: Secondary | ICD-10-CM | POA: Diagnosis not present

## 2021-05-10 DIAGNOSIS — E1140 Type 2 diabetes mellitus with diabetic neuropathy, unspecified: Secondary | ICD-10-CM | POA: Diagnosis not present

## 2021-05-10 DIAGNOSIS — T8131XA Disruption of external operation (surgical) wound, not elsewhere classified, initial encounter: Secondary | ICD-10-CM | POA: Diagnosis not present

## 2021-05-10 DIAGNOSIS — I252 Old myocardial infarction: Secondary | ICD-10-CM | POA: Diagnosis not present

## 2021-05-10 DIAGNOSIS — E785 Hyperlipidemia, unspecified: Secondary | ICD-10-CM | POA: Diagnosis not present

## 2021-05-10 NOTE — Telephone Encounter (Signed)
Marchelle Folks from [facility] called requesting updated orders on graft wound care for pt. Please advice

## 2021-05-11 DIAGNOSIS — T8131XA Disruption of external operation (surgical) wound, not elsewhere classified, initial encounter: Secondary | ICD-10-CM | POA: Diagnosis not present

## 2021-05-11 NOTE — Telephone Encounter (Signed)
Called [place] and clarified orders

## 2021-05-11 NOTE — Telephone Encounter (Signed)
Called again waiting on a response back for orders

## 2021-05-12 DIAGNOSIS — T8131XA Disruption of external operation (surgical) wound, not elsewhere classified, initial encounter: Secondary | ICD-10-CM | POA: Diagnosis not present

## 2021-05-13 ENCOUNTER — Ambulatory Visit (INDEPENDENT_AMBULATORY_CARE_PROVIDER_SITE_OTHER): Payer: BC Managed Care – PPO | Admitting: Podiatry

## 2021-05-13 ENCOUNTER — Encounter: Payer: Self-pay | Admitting: Podiatry

## 2021-05-13 ENCOUNTER — Telehealth: Payer: Self-pay

## 2021-05-13 ENCOUNTER — Telehealth: Payer: Self-pay | Admitting: Podiatry

## 2021-05-13 ENCOUNTER — Telehealth: Payer: Self-pay | Admitting: Cardiology

## 2021-05-13 DIAGNOSIS — E11621 Type 2 diabetes mellitus with foot ulcer: Secondary | ICD-10-CM

## 2021-05-13 DIAGNOSIS — L97423 Non-pressure chronic ulcer of left heel and midfoot with necrosis of muscle: Secondary | ICD-10-CM | POA: Diagnosis not present

## 2021-05-13 DIAGNOSIS — T8131XA Disruption of external operation (surgical) wound, not elsewhere classified, initial encounter: Secondary | ICD-10-CM | POA: Diagnosis not present

## 2021-05-13 NOTE — H&P (View-Only) (Signed)
  Subjective:  Patient ID: [name], male    DOB: 27-Jun-1955,  MRN: [id number]  Chief Complaint  Patient presents with   Wound Check    I am doing ok and I do have the wound vac on    65 y.o. male presents for wound care. Hx confirmed with patient. Denies new issues. Has been using the wound VAC Objective:  Physical Exam: Left foot warm and well perfused. Wound medial arch at navicular area measures 4.5*5 today with exposed tendon and partial talus bone measuring 2*2. Bone slightly grayer in appearance. No purulence noted. No warmth or erythema, no ascending cellulitis. Mild serous drainage only. Mild midfoot edema, no warmth or erythema Assessment:   1. 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  Ulcer left midfoot -Wound overall slightly larger in size but the bone is starting to cover. The bone does look slightly discolored. A sterile pickup was used to palpate the bone and it was firm and hard. He would benefit from bone biopsy along with debridement. I am concerned that if this is infected further options are limited and the risk of limb loss is high. -Wound thoroughly cleansed. -Apligraf applied to promote healing. -We discussed doing an additional debridement to further promote healing -Patient agrees to proceed with surgical intervention. All risks, benefits, and alternatives discussed with patient. No guarantees given. Consent reviewed and signed by patient. -Planned procedures: debridement of left foot wound, possible application of skin graft substitute, possible bone biopsy.   Procedure: Application Skin Graft Substitute Rationale: Wound in need of advanced wound therapy to accelerate healing Pre-Debridement Wound Measurements: 4.5 cm x 5 cm x 0.2 cm  Post-Debridement Wound Measurements: same as pre-debridement. Type of Debridement:  Selective Tissue Removed: Devitalized soft-tissue Instrumentation: 3 mm dermal curette Skin substitute: Apligraf    Lot #: [id number]    Expiration: 05/26/21 Hydration: none Secondary Dressing: adaptic Disposition: Patient tolerated procedure well. Patient to return in 1 week for follow-up.  Procedure: Wound VAC Application Location: left medial foot Wound Measurement: 4.5 cm x 5 cm x 0.2 cm  Technique: Black foam to wound base, followed by adherent dressing. Set to 125 mmHg with good seal noted. Disposition: Patient tolerated procedure well.   No follow-ups on file.

## 2021-05-13 NOTE — Telephone Encounter (Signed)
Spoke to patient. This is the same procedure as last time, debridement. He says his pcp will fill out the physical form besides cardiac. The patient is getting a actual clearance form sent today as well. What was sent was just the physical form.

## 2021-05-13 NOTE — Progress Notes (Signed)
  Subjective:  Patient ID: [name], male    DOB: 27-Jun-1955,  MRN: [id number]  Chief Complaint  Patient presents with   Wound Check    I am doing ok and I do have the wound vac on    65 y.o. male presents for wound care. Hx confirmed with patient. Denies new issues. Has been using the wound VAC Objective:  Physical Exam: Left foot warm and well perfused. Wound medial arch at navicular area measures 4.5*5 today with exposed tendon and partial talus bone measuring 2*2. Bone slightly grayer in appearance. No purulence noted. No warmth or erythema, no ascending cellulitis. Mild serous drainage only. Mild midfoot edema, no warmth or erythema Assessment:   1. 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  Ulcer left midfoot -Wound overall slightly larger in size but the bone is starting to cover. The bone does look slightly discolored. A sterile pickup was used to palpate the bone and it was firm and hard. He would benefit from bone biopsy along with debridement. I am concerned that if this is infected further options are limited and the risk of limb loss is high. -Wound thoroughly cleansed. -Apligraf applied to promote healing. -We discussed doing an additional debridement to further promote healing -Patient agrees to proceed with surgical intervention. All risks, benefits, and alternatives discussed with patient. No guarantees given. Consent reviewed and signed by patient. -Planned procedures: debridement of left foot wound, possible application of skin graft substitute, possible bone biopsy.   Procedure: Application Skin Graft Substitute Rationale: Wound in need of advanced wound therapy to accelerate healing Pre-Debridement Wound Measurements: 4.5 cm x 5 cm x 0.2 cm  Post-Debridement Wound Measurements: same as pre-debridement. Type of Debridement:  Selective Tissue Removed: Devitalized soft-tissue Instrumentation: 3 mm dermal curette Skin substitute: Apligraf    Lot #: [id number]    Expiration: 05/26/21 Hydration: none Secondary Dressing: adaptic Disposition: Patient tolerated procedure well. Patient to return in 1 week for follow-up.  Procedure: Wound VAC Application Location: left medial foot Wound Measurement: 4.5 cm x 5 cm x 0.2 cm  Technique: Black foam to wound base, followed by adherent dressing. Set to 125 mmHg with good seal noted. Disposition: Patient tolerated procedure well.   No follow-ups on file.

## 2021-05-13 NOTE — Telephone Encounter (Signed)
Fax : [phone] - please fax this to them today   Patient needs done today for his upcoming surgery  [place] Pre-operative Risk Assessment    Patient Name: [name]  DOB: [birth year] MRN: [id number]  HEARTCARE STAFF:  - IMPORTANT!!!!!! Under Visit Info/Reason for Call, type in Other and utilize the format Clearance MM/DD/YY or Clearance TBD. Do not use dashes or single digits. - Please review there is not already an duplicate clearance open for this procedure. - If request is for dental extraction, please clarify the # of teeth to be extracted. - If the patient is currently at the dentist's office, call Pre-Op Callback Staff (MA/nurse) to input urgent request.  - If the patient is not currently in the dentist office, please route to the Pre-Op pool.  Request for surgical clearance:  What type of surgery is being performed?   When is this surgery scheduled?   What type of clearance is required (medical clearance vs. Pharmacy clearance to hold med vs. Both)?   Are there any medications that need to be held prior to surgery and how long?   Practice name and name of physician performing surgery?   What is the office phone number?    7.   What is the office fax number?   8.   Anesthesia type (None, local, MAC, general) ?     _________________________________________________________________   (provider comments below)

## 2021-05-13 NOTE — Telephone Encounter (Signed)
Pt dropped off his Surgical Clearance Paperwork today, he is calling to f/u to see if the paperwork is complete. Pt's procedure is 05/19/21 at 1pm

## 2021-05-13 NOTE — Telephone Encounter (Signed)
Received an incomplete clearance request.  I requested additional information via fax.

## 2021-05-14 ENCOUNTER — Ambulatory Visit: Payer: Self-pay | Admitting: Podiatry

## 2021-05-14 DIAGNOSIS — L97423 Non-pressure chronic ulcer of left heel and midfoot with necrosis of muscle: Secondary | ICD-10-CM

## 2021-05-14 DIAGNOSIS — T8131XA Disruption of external operation (surgical) wound, not elsewhere classified, initial encounter: Secondary | ICD-10-CM | POA: Diagnosis not present

## 2021-05-14 DIAGNOSIS — E11621 Type 2 diabetes mellitus with foot ulcer: Secondary | ICD-10-CM

## 2021-05-14 NOTE — Progress Notes (Signed)
Pt. Needs H&P for upcoming surgery.

## 2021-05-14 NOTE — Telephone Encounter (Signed)
This has been addressed in preop poole.

## 2021-05-14 NOTE — Telephone Encounter (Signed)
   Primary Cardiologist: Gypsy Balsam, MD  Chart reviewed as part of pre-operative protocol coverage. Given past medical history and time since last visit, based on ACC/AHA guidelines, [name] would be at acceptable risk for the planned procedure without further cardiovascular testing.   His RCRI is a class II risk, 0.9% risk of major cardiac event.  I will route this recommendation to the requesting party via Epic fax function and remove from pre-op pool.  Please call with questions.  Thomasene Ripple. [facility] NP-C    05/14/2021, 9:03 AM [facility] [address] Office [phone] Fax [phone]

## 2021-05-14 NOTE — Patient Instructions (Signed)
DUE TO COVID-19 ONLY ONE VISITOR IS ALLOWED TO COME WITH YOU AND STAY IN THE WAITING ROOM ONLY DURING PRE OP AND PROCEDURE.   **NO VISITORS ARE ALLOWED IN THE SHORT STAY AREA OR RECOVERY ROOM!!**  IF YOU WILL BE ADMITTED INTO THE HOSPITAL YOU ARE ALLOWED ONLY TWO SUPPORT PEOPLE DURING VISITATION HOURS ONLY (7 AM -8PM)   The support person(s) must pass our screening, gel in and out, and wear a mask at all times, including in the patient's room. Patients must also wear a mask when staff or their support person are in the room. Visitors GUEST BADGE MUST BE WORN VISIBLY  One adult visitor may remain with you overnight and MUST be in the room by 8 P.M.  No visitors under the age of 75. Any visitor under the age of 76 must be accompanied by an adult.        Your procedure is scheduled on: 05/19/21   Report to [facility] Main Entrance    Report to admitting at: 11:15 AM   Call this number if you have problems the morning of surgery [phone]   Do not eat food :After Midnight.   May have liquids until:10:30 AM    day of surgery  CLEAR LIQUID DIET  Foods Allowed                                                                     Foods Excluded  Water, Black Coffee and tea, regular and decaf                             liquids that you cannot  Plain Jell-O in any flavor  (No red)                                           see through such as: Fruit ices (not with fruit pulp)                                     milk, soups, orange juice              Iced Popsicles (No red)                                    All solid food                                   Apple juices Sports drinks like Gatorade (No red) Lightly seasoned clear broth or consume(fat free) Sugar  Sample Menu Breakfast                                Lunch  Supper Cranberry juice                    Beef broth                            Chicken broth Jell-O                                      Grape juice                           Apple juice Coffee or tea                        Jell-O                                      Popsicle                                                Coffee or tea                        Coffee or tea      Complete one Gatorade drink the morning of surgery at: 10:30 AM       the day of surgery.    The day of surgery:  Drink ONE (1) Pre-Surgery Clear Ensure or G2 by am the morning of surgery. Drink in one sitting. Do not sip.  This drink was given to you during your hospital  pre-op appointment visit. Nothing else to drink after completing the  Pre-Surgery Clear Ensure or G2.          If you have questions, please contact your surgeon's office.    Oral Hygiene is also important to reduce your risk of infection.                                    Remember - BRUSH YOUR TEETH THE MORNING OF SURGERY WITH YOUR REGULAR TOOTHPASTE   Do NOT smoke after Midnight   Take these medicines the morning of surgery with A SIP OF WATER: metoprolol.Use eye drops as usual. How to Manage Your Diabetes Before and After Surgery  Why is it important to control my blood sugar before and after surgery? Improving blood sugar levels before and after surgery helps healing and can limit problems. A way of improving blood sugar control is eating a healthy diet by:  Eating less sugar and carbohydrates  Increasing activity/exercise  Talking with your doctor about reaching your blood sugar goals High blood sugars (greater than 180 mg/dL) can raise your risk of infections and slow your recovery, so you will need to focus on controlling your diabetes during the weeks before surgery. Make sure that the doctor who takes care of your diabetes knows about your planned surgery including the date and location.  How do I manage my blood sugar before surgery? Check your blood sugar at least 4 times a day, starting 2 days before surgery, to make  sure that the level is not too high  or low. Check your blood sugar the morning of your surgery when you wake up and every 2 hours until you get to the Short Stay unit. If your blood sugar is less than 70 mg/dL, you will need to treat for low blood sugar: Do not take insulin. Treat a low blood sugar (less than 70 mg/dL) with  cup of clear juice (cranberry or apple), 4 glucose tablets, OR glucose gel. Recheck blood sugar in 15 minutes after treatment (to make sure it is greater than 70 mg/dL). If your blood sugar is not greater than 70 mg/dL on recheck, call [phone] for further instructions. Report your blood sugar to the short stay nurse when you get to Short Stay.  If you are admitted to the hospital after surgery: Your blood sugar will be checked by the staff and you will probably be given insulin after surgery (instead of oral diabetes medicines) to make sure you have good blood sugar levels. The goal for blood sugar control after surgery is 80-180 mg/dL.  WHAT DO I DO ABOUT MY DIABETES MEDICATION?  Do not take oral diabetes medicines (pills) the morning of surgery.  THE DAY BEFORE SURGERY, take metformin and januvia as usual. Take ONLY half of the lantus dose.      THE MORNING OF SURGERY, DO NOT TAKE ANY ORAL DIABETIC MEDICATIONS DAY OF YOUR SURGERY.Take ONLY half of the lantus dose.                               You may not have any metal on your body including hair pins, jewelry, and body piercing             Do not wear  lotions, powders, perfumes/cologne, or deodorant              Men may shave face and neck.   Do not bring valuables to the hospital. [facility] IS NOT             RESPONSIBLE   FOR VALUABLES.   Contacts, dentures or bridgework may not be worn into surgery.   Bring small overnight bag day of surgery.    Patients discharged on the day of surgery will not be allowed to drive home.   Special Instructions: Bring a copy of your healthcare power of attorney and living will documents          the day of surgery if you haven't scanned them before.              Please read over the following fact sheets you were given: IF YOU HAVE QUESTIONS ABOUT YOUR PRE-OP INSTRUCTIONS PLEASE CALL [phone]     [facility] - Preparing for Surgery Before surgery, you can play an important role.  Because skin is not sterile, your skin needs to be as free of germs as possible.  You can reduce the number of germs on your skin by washing with CHG (chlorahexidine gluconate) soap before surgery.  CHG is an antiseptic cleaner which kills germs and bonds with the skin to continue killing germs even after washing. Please DO NOT use if you have an allergy to CHG or antibacterial soaps.  If your skin becomes reddened/irritated stop using the CHG and inform your nurse when you arrive at Short Stay. Do not shave (including legs and underarms) for at least 48 hours prior to the first CHG  shower.  You may shave your face/neck. Please follow these instructions carefully:  1.  Shower with CHG Soap the night before surgery and the  morning of Surgery.  2.  If you choose to wash your hair, wash your hair first as usual with your  normal  shampoo.  3.  After you shampoo, rinse your hair and body thoroughly to remove the  shampoo.                           4.  Use CHG as you would any other liquid soap.  You can apply chg directly  to the skin and wash                       Gently with a scrungie or clean washcloth.  5.  Apply the CHG Soap to your body ONLY FROM THE NECK DOWN.   Do not use on face/ open                           Wound or open sores. Avoid contact with eyes, ears mouth and genitals (private parts).                       Wash face,  Genitals (private parts) with your normal soap.             6.  Wash thoroughly, paying special attention to the area where your surgery  will be performed.  7.  Thoroughly rinse your body with warm water from the neck down.  8.  DO NOT shower/wash with your normal soap after  using and rinsing off  the CHG Soap.                9.  Pat yourself dry with a clean towel.            10.  Wear clean pajamas.            11.  Place clean sheets on your bed the night of your first shower and do not  sleep with pets. Day of Surgery : Do not apply any lotions/deodorants the morning of surgery.  Please wear clean clothes to the hospital/surgery center.  FAILURE TO FOLLOW THESE INSTRUCTIONS MAY RESULT IN THE CANCELLATION OF YOUR SURGERY PATIENT SIGNATURE_________________________________  NURSE SIGNATURE__________________________________  ________________________________________________________________________

## 2021-05-14 NOTE — Telephone Encounter (Signed)
Routed to the surgical clearance pool for patient   [place] Pre-operative Risk Assessment     Patient Name: [name]  DOB: 1955/08/25  MRN: [id number]     HEARTCARE STAFF:     - IMPORTANT!!!!!! Under Visit Info/Reason for Call, type in Other and utilize the format Clearance MM/DD/YY or Clearance TBD. Do not use dashes or single digits.  - Please review there is not already an duplicate clearance open for this procedure.  - If request is for dental extraction, please clarify the # of teeth to be extracted.  - If the patient is currently at the dentist's office, call Pre-Op Callback Staff (MA/nurse) to input urgent request.  - If the patient is not currently in the dentist office, please route to the Pre-Op pool.     Request for surgical clearance:     1. What type of surgery is being performed?  Wound debridement of left lower extremity wound.     2. When is this surgery scheduled?  05/19/21     3. What type of clearance is required (medical clearance vs. Pharmacy clearance to hold med vs. Both)?  Medical clearance     4. Are there any medications that need to be held prior to surgery and how long?  No     5. Practice name and name of physician performing surgery?  [facility] - Dr. Hardie Pulley     6. What is the office phone number?  [phone]     7.   What is the office fax number?        [phone]     8.   Anesthesia type (None, local, MAC, general) ?  MAC with local

## 2021-05-15 DIAGNOSIS — D649 Anemia, unspecified: Secondary | ICD-10-CM | POA: Diagnosis not present

## 2021-05-15 DIAGNOSIS — E785 Hyperlipidemia, unspecified: Secondary | ICD-10-CM | POA: Diagnosis not present

## 2021-05-15 DIAGNOSIS — Z951 Presence of aortocoronary bypass graft: Secondary | ICD-10-CM | POA: Diagnosis not present

## 2021-05-15 DIAGNOSIS — I10 Essential (primary) hypertension: Secondary | ICD-10-CM | POA: Diagnosis not present

## 2021-05-15 DIAGNOSIS — M1990 Unspecified osteoarthritis, unspecified site: Secondary | ICD-10-CM | POA: Diagnosis not present

## 2021-05-15 DIAGNOSIS — E11621 Type 2 diabetes mellitus with foot ulcer: Secondary | ICD-10-CM | POA: Diagnosis not present

## 2021-05-15 DIAGNOSIS — Z7984 Long term (current) use of oral hypoglycemic drugs: Secondary | ICD-10-CM | POA: Diagnosis not present

## 2021-05-15 DIAGNOSIS — Z794 Long term (current) use of insulin: Secondary | ICD-10-CM | POA: Diagnosis not present

## 2021-05-15 DIAGNOSIS — L97528 Non-pressure chronic ulcer of other part of left foot with other specified severity: Secondary | ICD-10-CM | POA: Diagnosis not present

## 2021-05-15 DIAGNOSIS — E1169 Type 2 diabetes mellitus with other specified complication: Secondary | ICD-10-CM | POA: Diagnosis not present

## 2021-05-15 DIAGNOSIS — T8131XA Disruption of external operation (surgical) wound, not elsewhere classified, initial encounter: Secondary | ICD-10-CM | POA: Diagnosis not present

## 2021-05-15 DIAGNOSIS — K219 Gastro-esophageal reflux disease without esophagitis: Secondary | ICD-10-CM | POA: Diagnosis not present

## 2021-05-15 DIAGNOSIS — I2510 Atherosclerotic heart disease of native coronary artery without angina pectoris: Secondary | ICD-10-CM | POA: Diagnosis not present

## 2021-05-15 DIAGNOSIS — I252 Old myocardial infarction: Secondary | ICD-10-CM | POA: Diagnosis not present

## 2021-05-15 DIAGNOSIS — E1140 Type 2 diabetes mellitus with diabetic neuropathy, unspecified: Secondary | ICD-10-CM | POA: Diagnosis not present

## 2021-05-15 DIAGNOSIS — M869 Osteomyelitis, unspecified: Secondary | ICD-10-CM | POA: Diagnosis not present

## 2021-05-15 DIAGNOSIS — Z7982 Long term (current) use of aspirin: Secondary | ICD-10-CM | POA: Diagnosis not present

## 2021-05-16 DIAGNOSIS — T8131XA Disruption of external operation (surgical) wound, not elsewhere classified, initial encounter: Secondary | ICD-10-CM | POA: Diagnosis not present

## 2021-05-17 ENCOUNTER — Other Ambulatory Visit: Payer: Self-pay

## 2021-05-17 ENCOUNTER — Telehealth: Payer: Self-pay | Admitting: Podiatry

## 2021-05-17 ENCOUNTER — Encounter (HOSPITAL_COMMUNITY)
Admission: RE | Admit: 2021-05-17 | Discharge: 2021-05-17 | Disposition: A | Discharge status: Home / Self Care | Payer: BC Managed Care – PPO | Source: Ambulatory Visit | Attending: Podiatry | Admitting: Podiatry

## 2021-05-17 ENCOUNTER — Encounter (HOSPITAL_COMMUNITY): Payer: Self-pay

## 2021-05-17 ENCOUNTER — Telehealth: Payer: Self-pay | Admitting: Urology

## 2021-05-17 VITALS — BP 121/68 | HR 88 | Temp 97.7°F | Resp 100 | Ht 74.0 in | Wt 183.0 lb

## 2021-05-17 DIAGNOSIS — L97524 Non-pressure chronic ulcer of other part of left foot with necrosis of bone: Secondary | ICD-10-CM | POA: Diagnosis not present

## 2021-05-17 DIAGNOSIS — I10 Essential (primary) hypertension: Secondary | ICD-10-CM | POA: Diagnosis not present

## 2021-05-17 DIAGNOSIS — E119 Type 2 diabetes mellitus without complications: Secondary | ICD-10-CM | POA: Diagnosis not present

## 2021-05-17 DIAGNOSIS — D649 Anemia, unspecified: Secondary | ICD-10-CM | POA: Diagnosis not present

## 2021-05-17 DIAGNOSIS — M1990 Unspecified osteoarthritis, unspecified site: Secondary | ICD-10-CM | POA: Diagnosis not present

## 2021-05-17 DIAGNOSIS — Z7984 Long term (current) use of oral hypoglycemic drugs: Secondary | ICD-10-CM | POA: Diagnosis not present

## 2021-05-17 DIAGNOSIS — Z01812 Encounter for preprocedural laboratory examination: Secondary | ICD-10-CM | POA: Insufficient documentation

## 2021-05-17 DIAGNOSIS — I252 Old myocardial infarction: Secondary | ICD-10-CM | POA: Diagnosis not present

## 2021-05-17 DIAGNOSIS — E785 Hyperlipidemia, unspecified: Secondary | ICD-10-CM | POA: Diagnosis not present

## 2021-05-17 DIAGNOSIS — Z79899 Other long term (current) drug therapy: Secondary | ICD-10-CM | POA: Diagnosis not present

## 2021-05-17 DIAGNOSIS — E1140 Type 2 diabetes mellitus with diabetic neuropathy, unspecified: Secondary | ICD-10-CM | POA: Diagnosis not present

## 2021-05-17 DIAGNOSIS — Z794 Long term (current) use of insulin: Secondary | ICD-10-CM | POA: Diagnosis not present

## 2021-05-17 DIAGNOSIS — T8131XA Disruption of external operation (surgical) wound, not elsewhere classified, initial encounter: Secondary | ICD-10-CM | POA: Diagnosis not present

## 2021-05-17 DIAGNOSIS — M869 Osteomyelitis, unspecified: Secondary | ICD-10-CM | POA: Diagnosis not present

## 2021-05-17 DIAGNOSIS — E11621 Type 2 diabetes mellitus with foot ulcer: Secondary | ICD-10-CM | POA: Diagnosis not present

## 2021-05-17 DIAGNOSIS — Z951 Presence of aortocoronary bypass graft: Secondary | ICD-10-CM | POA: Diagnosis not present

## 2021-05-17 DIAGNOSIS — E7800 Pure hypercholesterolemia, unspecified: Secondary | ICD-10-CM | POA: Diagnosis not present

## 2021-05-17 DIAGNOSIS — I2510 Atherosclerotic heart disease of native coronary artery without angina pectoris: Secondary | ICD-10-CM | POA: Diagnosis not present

## 2021-05-17 DIAGNOSIS — E1169 Type 2 diabetes mellitus with other specified complication: Secondary | ICD-10-CM | POA: Diagnosis not present

## 2021-05-17 DIAGNOSIS — Z7982 Long term (current) use of aspirin: Secondary | ICD-10-CM | POA: Diagnosis not present

## 2021-05-17 DIAGNOSIS — L97528 Non-pressure chronic ulcer of other part of left foot with other specified severity: Secondary | ICD-10-CM | POA: Diagnosis not present

## 2021-05-17 DIAGNOSIS — K219 Gastro-esophageal reflux disease without esophagitis: Secondary | ICD-10-CM | POA: Diagnosis not present

## 2021-05-17 LAB — GLUCOSE, CAPILLARY: Glucose-Capillary: 151 mg/dL — ABNORMAL HIGH (ref 70–99)

## 2021-05-17 LAB — CBC
HCT: 41.6 % (ref 39.0–52.0)
Hemoglobin: 13.2 g/dL (ref 13.0–17.0)
MCH: 30.1 pg (ref 26.0–34.0)
MCHC: 31.7 g/dL (ref 30.0–36.0)
MCV: 94.8 fL (ref 80.0–100.0)
Platelets: 233 10*3/uL (ref 150–400)
RBC: 4.39 MIL/uL (ref 4.22–5.81)
RDW: 13.2 % (ref 11.5–15.5)
WBC: 8.1 10*3/uL (ref 4.0–10.5)
nRBC: 0 % (ref 0.0–0.2)

## 2021-05-17 NOTE — Telephone Encounter (Signed)
Pt left vm wanting to know if you could advise him about his cardiac clearance.  Said his pcp would not answer any questions.  (I did fax report to [facility]) Copy in your box

## 2021-05-17 NOTE — Telephone Encounter (Addendum)
DOS - 05/19/21  WOUND DEBRIDEMENT LT --- 11043 SKIN GRAFT --- 15275 BONE BIOPSY ---  20220   [employer] EFFECTIVE DATE -   SPOKE WITH ERIC WITH [employer] AND HE STATED THAT FOR CPT CODES 16109, 20290 AND 15275 NO PRIOR AUTH IS REQUIRED.   REF # [id number]

## 2021-05-17 NOTE — Telephone Encounter (Signed)
Wife called and they have multiple questions about pt wearing boot.  Phlebotomist at pcp's office gave pt information about some type of cream and they have questions

## 2021-05-17 NOTE — Progress Notes (Signed)
COVID Vaccine Completed:Yes Date COVID Vaccine completed: 2022 x 3 COVID vaccine manufacturer: 1 Pfizer   2 Moderna    COVID Test: N/A  PCP - Dr. [name] Cardiologist - Dr. Gypsy Balsam. Clearance: Francesco Sor: NP: 05/14/21. : EPIC  Chest x-ray -  EKG - 04/08/21 Stress Test -  ECHO - 12/26/17 Cardiac Cath -  Pacemaker/ICD device last checked:  Sleep Study -  CPAP -   Fasting Blood Sugar - 70's-- 100's Checks Blood Sugar ___3__ times a day  Blood Thinner Instructions: Aspirin Instructions: No instructions. Last Dose:  Anesthesia review: Hx: DIA,HTN,CAD,MI  Patient denies shortness of breath, fever, cough and chest pain at PAT appointment   Patient verbalized understanding of instructions that were given to them at the PAT appointment. Patient was also instructed that they will need to review over the PAT instructions again at home before surgery.

## 2021-05-18 DIAGNOSIS — T8131XA Disruption of external operation (surgical) wound, not elsewhere classified, initial encounter: Secondary | ICD-10-CM | POA: Diagnosis not present

## 2021-05-19 ENCOUNTER — Other Ambulatory Visit: Payer: Self-pay

## 2021-05-19 ENCOUNTER — Ambulatory Visit (HOSPITAL_COMMUNITY): Payer: BC Managed Care – PPO | Admitting: Physician Assistant

## 2021-05-19 ENCOUNTER — Telehealth: Payer: Self-pay | Admitting: Podiatry

## 2021-05-19 ENCOUNTER — Encounter (HOSPITAL_COMMUNITY): Payer: Self-pay | Admitting: Podiatry

## 2021-05-19 ENCOUNTER — Encounter (HOSPITAL_COMMUNITY)
Admission: RE | Disposition: A | Discharge status: Home / Self Care | Payer: Self-pay | Source: Home / Self Care | Attending: Podiatry

## 2021-05-19 ENCOUNTER — Ambulatory Visit (HOSPITAL_COMMUNITY)
Admission: RE | Admit: 2021-05-19 | Discharge: 2021-05-19 | Disposition: A | Discharge status: Home / Self Care | Payer: BC Managed Care – PPO | Attending: Podiatry | Admitting: Podiatry

## 2021-05-19 ENCOUNTER — Encounter: Payer: Self-pay | Admitting: Podiatry

## 2021-05-19 ENCOUNTER — Ambulatory Visit (HOSPITAL_COMMUNITY): Payer: BC Managed Care – PPO | Admitting: Certified Registered Nurse Anesthetist

## 2021-05-19 DIAGNOSIS — I10 Essential (primary) hypertension: Secondary | ICD-10-CM | POA: Insufficient documentation

## 2021-05-19 DIAGNOSIS — L97423 Non-pressure chronic ulcer of left heel and midfoot with necrosis of muscle: Secondary | ICD-10-CM

## 2021-05-19 DIAGNOSIS — K219 Gastro-esophageal reflux disease without esophagitis: Secondary | ICD-10-CM | POA: Insufficient documentation

## 2021-05-19 DIAGNOSIS — L97524 Non-pressure chronic ulcer of other part of left foot with necrosis of bone: Secondary | ICD-10-CM | POA: Insufficient documentation

## 2021-05-19 DIAGNOSIS — M879 Osteonecrosis, unspecified: Secondary | ICD-10-CM | POA: Diagnosis not present

## 2021-05-19 DIAGNOSIS — L97404 Non-pressure chronic ulcer of unspecified heel and midfoot with necrosis of bone: Secondary | ICD-10-CM

## 2021-05-19 DIAGNOSIS — E11621 Type 2 diabetes mellitus with foot ulcer: Secondary | ICD-10-CM | POA: Insufficient documentation

## 2021-05-19 DIAGNOSIS — Z79899 Other long term (current) drug therapy: Secondary | ICD-10-CM | POA: Insufficient documentation

## 2021-05-19 DIAGNOSIS — Z7984 Long term (current) use of oral hypoglycemic drugs: Secondary | ICD-10-CM | POA: Insufficient documentation

## 2021-05-19 DIAGNOSIS — Z794 Long term (current) use of insulin: Secondary | ICD-10-CM | POA: Insufficient documentation

## 2021-05-19 DIAGNOSIS — E08621 Diabetes mellitus due to underlying condition with foot ulcer: Secondary | ICD-10-CM

## 2021-05-19 DIAGNOSIS — T8131XA Disruption of external operation (surgical) wound, not elsewhere classified, initial encounter: Secondary | ICD-10-CM | POA: Diagnosis not present

## 2021-05-19 DIAGNOSIS — E119 Type 2 diabetes mellitus without complications: Secondary | ICD-10-CM | POA: Insufficient documentation

## 2021-05-19 HISTORY — PX: WOUND DEBRIDEMENT: SHX247

## 2021-05-19 HISTORY — PX: BONE BIOPSY: SHX375

## 2021-05-19 LAB — GLUCOSE, CAPILLARY
Glucose-Capillary: 132 mg/dL — ABNORMAL HIGH (ref 70–99)
Glucose-Capillary: 119 mg/dL — ABNORMAL HIGH (ref 70–99)

## 2021-05-19 SURGERY — DEBRIDEMENT, WOUND
Anesthesia: Monitor Anesthesia Care | Laterality: Left

## 2021-05-19 MED ORDER — MIDAZOLAM HCL 2 MG/2ML IJ SOLN: 0.5000 mg | Freq: Once | INTRAMUSCULAR | Status: DC | PRN

## 2021-05-19 MED ORDER — MEPERIDINE HCL 50 MG/ML IJ SOLN: 6.2500 mg | INTRAMUSCULAR | Status: DC | PRN

## 2021-05-19 MED ORDER — MIDAZOLAM HCL 5 MG/5ML IJ SOLN
INTRAMUSCULAR | Status: DC | PRN
  Administered 2021-05-19: 2 mg via INTRAVENOUS

## 2021-05-19 MED ORDER — PROPOFOL 500 MG/50ML IV EMUL
INTRAVENOUS | Status: AC
  Filled 2021-05-19: qty 50

## 2021-05-19 MED ORDER — HYDROMORPHONE HCL 1 MG/ML IJ SOLN: 0.2500 mg | INTRAMUSCULAR | Status: DC | PRN

## 2021-05-19 MED ORDER — FENTANYL CITRATE (PF) 100 MCG/2ML IJ SOLN
INTRAMUSCULAR | Status: AC
  Filled 2021-05-19: qty 2

## 2021-05-19 MED ORDER — CEPHALEXIN 500 MG PO CAPS: 500.0000 mg | ORAL_CAPSULE | Freq: Two times a day (BID) | ORAL | 0 refills | Status: DC

## 2021-05-19 MED ORDER — ORAL CARE MOUTH RINSE: 15.0000 mL | Freq: Once | OROMUCOSAL | Status: AC

## 2021-05-19 MED ORDER — VANCOMYCIN HCL 1000 MG IV SOLR
INTRAVENOUS | Status: AC
  Filled 2021-05-19: qty 20

## 2021-05-19 MED ORDER — VANCOMYCIN HCL 1000 MG IV SOLR
INTRAVENOUS | Status: DC | PRN
  Administered 2021-05-19: 1000 mg via TOPICAL

## 2021-05-19 MED ORDER — CHLORHEXIDINE GLUCONATE 0.12 % MT SOLN
15.0000 mL | Freq: Once | OROMUCOSAL | Status: AC
  Administered 2021-05-19: 15 mL via OROMUCOSAL

## 2021-05-19 MED ORDER — PROPOFOL 500 MG/50ML IV EMUL
INTRAVENOUS | Status: DC | PRN
Start: 2021-05-19 — End: 2021-05-19
  Administered 2021-05-19: 125 ug/kg/min via INTRAVENOUS

## 2021-05-19 MED ORDER — OXYCODONE HCL 5 MG/5ML PO SOLN: 5.0000 mg | Freq: Once | ORAL | Status: DC | PRN

## 2021-05-19 MED ORDER — PROMETHAZINE HCL 25 MG/ML IJ SOLN: 6.2500 mg | INTRAMUSCULAR | Status: DC | PRN

## 2021-05-19 MED ORDER — CEFAZOLIN SODIUM-DEXTROSE 2-4 GM/100ML-% IV SOLN
2.0000 g | INTRAVENOUS | Status: AC
  Administered 2021-05-19: 2 g via INTRAVENOUS
  Filled 2021-05-19: qty 100

## 2021-05-19 MED ORDER — SODIUM CHLORIDE 0.9 % IR SOLN
Status: DC | PRN
  Administered 2021-05-19: 3000 mL
  Administered 2021-05-19: 1000 mL

## 2021-05-19 MED ORDER — PHENYLEPHRINE 40 MCG/ML (10ML) SYRINGE FOR IV PUSH (FOR BLOOD PRESSURE SUPPORT)
PREFILLED_SYRINGE | INTRAVENOUS | Status: DC | PRN
  Administered 2021-05-19: 80 ug via INTRAVENOUS

## 2021-05-19 MED ORDER — BUPIVACAINE HCL (PF) 0.5 % IJ SOLN
INTRAMUSCULAR | Status: AC
  Filled 2021-05-19: qty 30

## 2021-05-19 MED ORDER — BUPIVACAINE HCL (PF) 0.5 % IJ SOLN
INTRAMUSCULAR | Status: DC | PRN
  Administered 2021-05-19: 10 mL

## 2021-05-19 MED ORDER — MIDAZOLAM HCL 2 MG/2ML IJ SOLN
INTRAMUSCULAR | Status: AC
  Filled 2021-05-19: qty 2

## 2021-05-19 MED ORDER — OXYCODONE HCL 5 MG PO TABS: 5.0000 mg | ORAL_TABLET | Freq: Once | ORAL | Status: DC | PRN

## 2021-05-19 MED ORDER — LACTATED RINGERS IV SOLN: INTRAVENOUS | Status: DC

## 2021-05-19 SURGICAL SUPPLY — 69 items
BAG COUNTER SPONGE SURGICOUNT (BAG) IMPLANT
BLADE HEX COATED 2.75 (ELECTRODE) ×2 IMPLANT
BLADE OSCILLATING/SAGITTAL (BLADE)
BLADE SURG 15 STRL LF DISP TIS (BLADE) ×1 IMPLANT
BLADE SURG 15 STRL SS (BLADE) ×1
BLADE SW THK.38XMED LNG THN (BLADE) IMPLANT
BNDG ELASTIC 3X5.8 VLCR STR LF (GAUZE/BANDAGES/DRESSINGS) ×2 IMPLANT
BNDG ELASTIC 4X5.8 VLCR STR LF (GAUZE/BANDAGES/DRESSINGS) ×2 IMPLANT
BNDG ESMARK 4X9 LF (GAUZE/BANDAGES/DRESSINGS) ×2 IMPLANT
BNDG GAUZE ELAST 4 BULKY (GAUZE/BANDAGES/DRESSINGS) ×2 IMPLANT
CHLORAPREP W/TINT 26 (MISCELLANEOUS) IMPLANT
CNTNR URN SCR LID CUP LEK RST (MISCELLANEOUS) ×3 IMPLANT
CONT SPEC 4OZ STRL OR WHT (MISCELLANEOUS) ×3
COVER BACK TABLE 60X90IN (DRAPES) ×2 IMPLANT
CUFF TOURN SGL QUICK 18X4 (TOURNIQUET CUFF) IMPLANT
DRAPE EXTREMITY T 121X128X90 (DISPOSABLE) ×2 IMPLANT
DRAPE IMP U-DRAPE 54X76 (DRAPES) ×2 IMPLANT
DRAPE U-SHAPE 47X51 STRL (DRAPES) ×2 IMPLANT
DRSG EMULSION OIL 3X3 NADH (GAUZE/BANDAGES/DRESSINGS) ×2 IMPLANT
DRSG MEPILEX BORDER 4X12 (GAUZE/BANDAGES/DRESSINGS) ×2 IMPLANT
DRSG PAD ABDOMINAL 8X10 ST (GAUZE/BANDAGES/DRESSINGS) IMPLANT
ELECT REM PT RETURN 15FT ADLT (MISCELLANEOUS) ×2 IMPLANT
GAUZE 4X4 16PLY ~~LOC~~+RFID DBL (SPONGE) IMPLANT
GAUZE SPONGE 4X4 12PLY STRL (GAUZE/BANDAGES/DRESSINGS) ×6 IMPLANT
GAUZE XEROFORM 1X8 LF (GAUZE/BANDAGES/DRESSINGS) IMPLANT
GAUZE XEROFORM 5X9 LF (GAUZE/BANDAGES/DRESSINGS) IMPLANT
GLOVE SRG 8 PF TXTR STRL LF DI (GLOVE) ×1 IMPLANT
GLOVE SURG ENC MOIS LTX SZ7.5 (GLOVE) ×2 IMPLANT
GLOVE SURG NEOPR MICRO LF SZ8 (GLOVE) ×2 IMPLANT
GLOVE SURG UNDER POLY LF SZ8 (GLOVE) ×1
GOWN STRL REUS W/ TWL LRG LVL3 (GOWN DISPOSABLE) ×1 IMPLANT
GOWN STRL REUS W/TWL LRG LVL3 (GOWN DISPOSABLE) ×1
GOWN STRL REUS W/TWL XL LVL3 (GOWN DISPOSABLE) ×4 IMPLANT
HANDPIECE INTERPULSE COAX TIP (DISPOSABLE) ×1
KIT BASIN OR (CUSTOM PROCEDURE TRAY) ×2 IMPLANT
KIT TURNOVER KIT A (KITS) IMPLANT
MANIFOLD NEPTUNE II (INSTRUMENTS) ×2 IMPLANT
MATRIX PURAPLY AM COLLAGEN 6X9 (Tissue) ×1 IMPLANT
MATRIX PURAPLY MZ 1000MG (Tissue) ×1 IMPLANT
NEEDLE BIOPSY JAMSHIDI 11X6 (NEEDLE) ×2 IMPLANT
NEEDLE HYPO 25X1 1.5 SAFETY (NEEDLE) ×2 IMPLANT
NS IRRIG 1000ML POUR BTL (IV SOLUTION) IMPLANT
PACK ORTHO EXTREMITY (CUSTOM PROCEDURE TRAY) ×2 IMPLANT
PADDING CAST ABS 4INX4YD NS (CAST SUPPLIES) ×1
PADDING CAST ABS COTTON 4X4 ST (CAST SUPPLIES) ×1 IMPLANT
PENCIL SMOKE EVACUATOR (MISCELLANEOUS) IMPLANT
PURAPLY AM COLLAGEN 6X9 (Tissue) ×2 IMPLANT
PURAPLY MZ 1000MG (Tissue) ×2 IMPLANT
SET HNDPC FAN SPRY TIP SCT (DISPOSABLE) ×1 IMPLANT
SET IRRIG Y TYPE TUR BLADDER L (SET/KITS/TRAYS/PACK) IMPLANT
SLEEVE SCD COMPRESS KNEE MED (STOCKING) ×2 IMPLANT
SOL PREP POV-IOD 4OZ 10% (MISCELLANEOUS) ×2 IMPLANT
SOL PREP PROV IODINE SCRUB 4OZ (MISCELLANEOUS) ×2 IMPLANT
SPONGE SURGIFOAM ABS GEL 100 (HEMOSTASIS) IMPLANT
STAPLER VISISTAT 35W (STAPLE) ×2 IMPLANT
STOCKINETTE 8 INCH (MISCELLANEOUS) ×4 IMPLANT
SUT ETHILON 3 0 PS 1 (SUTURE) IMPLANT
SUT ETHILON 4 0 PS 2 18 (SUTURE) IMPLANT
SUT MNCRL AB 3-0 PS2 18 (SUTURE) IMPLANT
SUT MNCRL AB 4-0 PS2 18 (SUTURE) IMPLANT
SUT MON AB 5-0 PS2 18 (SUTURE) IMPLANT
SUT VIC AB 3-0 FS2 27 (SUTURE) ×2 IMPLANT
SUT VIC AB 4-0 PS2 18 (SUTURE) ×2 IMPLANT
SYR BULB EAR ULCER 3OZ GRN STR (SYRINGE) ×2 IMPLANT
SYR CONTROL 10ML LL (SYRINGE) ×2 IMPLANT
TOWEL OR 17X26 10 PK STRL BLUE (TOWEL DISPOSABLE) ×2 IMPLANT
UNDERPAD 30X36 HEAVY ABSORB (UNDERPADS AND DIAPERS) ×2 IMPLANT
YANKAUER SUCT BULB TIP 10FT TU (MISCELLANEOUS) ×2 IMPLANT
YANKAUER SUCT BULB TIP NO VENT (SUCTIONS) ×2 IMPLANT

## 2021-05-19 NOTE — Anesthesia Postprocedure Evaluation (Signed)
Anesthesia Post Note  Patient: [name]  Procedure(s) Performed: DEBRIDEMENT WOUND (Left) BONE BIOPSY (Left)     Patient location during evaluation: PACU Anesthesia Type: MAC Level of consciousness: awake and alert Pain management: pain level controlled Vital Signs Assessment: post-procedure vital signs reviewed and stable Respiratory status: spontaneous breathing, nonlabored ventilation, respiratory function stable and patient connected to nasal cannula oxygen Cardiovascular status: stable and blood pressure returned to baseline Postop Assessment: no apparent nausea or vomiting Anesthetic complications: no   No notable events documented.  Last Vitals:  Vitals:   05/19/21 1530 05/19/21 1600  BP: (!) 151/85 (!) 150/84  Pulse: 82 80  Resp: 18 20  Temp: 36.6 C 36.6 C  SpO2: 100% 100%    Last Pain:  Vitals:   05/19/21 1600  TempSrc:   PainSc: 0-No pain                 Tynleigh Birt L Shamar Kracke

## 2021-05-19 NOTE — H&P (Signed)
Anesthesia H&P Update: History and Physical Exam reviewed; patient is OK for planned anesthetic and procedure. ? ?

## 2021-05-19 NOTE — Progress Notes (Signed)
Patient left his discharge paperwork,writer called patient's daughter who picked him up she let me talk with wife I went back over discharge instructions with her over phone again she instructed me to throw papers away they would not be coming back pick up papers.

## 2021-05-19 NOTE — Discharge Instructions (Signed)
  After Surgery Instructions   1) If you are recuperating from surgery anywhere other than home, please be sure to leave us the number where you can be reached.  2) Go directly home and rest.  3) Keep the operated foot(feet) elevated six inches above the hip when sitting or lying down. This will help control swelling and pain.  4) Support the elevated foot and leg with pillows. DO NOT PLACE PILLOWS UNDER THE KNEE.  5) DO NOT REMOVE or get your bandages WET, unless you were given different instructions by your doctor to do so. This increases the risk of infection.  6) Wear your surgical shoe or surgical boot at all times when you are up on your feet.  7) A limited amount of pain and swelling may occur. The skin may take on a bruised appearance. DO NOT BE ALARMED, THIS IS NORMAL.  8) For slight pain and swelling, apply an ice pack directly over the bandages for 15 minutes only out of each hour of the day. Continue until seen in the office for your first post op visit. DO NOT APPLY ANY FORM OF HEAT TO THE AREA.  9) Have prescriptions filled immediately and take as directed.  10) Drink lots of liquids, water and juice to stay hydrated.  11) CALL IMMEDIATELY IF:  *Bleeding continues until the following day of surgery  *Pain increases and/or does not respond to medication  *Bandages or cast appears to tight  *If your bandage gets wet  *Trip, fall or stump your surgical foot  *If your temperature goes above 101  *If you have ANY questions at all  12) You are expected to be non-weightbearing after your surgery.   If you need to reach the nurse for any reason, please call: [place]: [phone] [place]: [phone] [place]: [phone]  

## 2021-05-19 NOTE — Transfer of Care (Signed)
Immediate Anesthesia Transfer of Care Note  Patient: [name]  Procedure(s) Performed: DEBRIDEMENT WOUND (Left) BONE BIOPSY (Left)  Patient Location: PACU  Anesthesia Type:MAC  Level of Consciousness: awake  Airway & Oxygen Therapy: Patient Spontanous Breathing and Patient connected to face mask oxygen  Post-op Assessment: Report given to RN and Post -op Vital signs reviewed and stable  Post vital signs: Reviewed and stable  Last Vitals:  Vitals Value Taken Time  BP 151/86 05/19/21 1515  Temp 36.5 C 05/19/21 1431  Pulse 82 05/19/21 1527  Resp 8 05/19/21 1525  SpO2 100 % 05/19/21 1527  Vitals shown include unvalidated device data.  Last Pain:  Vitals:   05/19/21 1500  TempSrc:   PainSc: 0-No pain         Complications: No notable events documented.

## 2021-05-19 NOTE — Op Note (Signed)
Patient Name: [name] DOB: 26-May-1956  MRN: [id number]   Date of Service: 05/19/21   Surgeon: Dr. Hardie Pulley, DPM Assistants: None Pre-operative Diagnosis: Ulcer left foot with necrosis of bone Post-operative Diagnosis: same Procedures: 1) Debridement and irrigation of left foot wound  2) Bone biopsy talus  3) Application of skin graft substitute  4) Application of wound VAC Pathology/Specimens: ID Type Source Tests Collected by Time Destination  1 : left talis bone from left foot Tissue Foot, Left SURGICAL PATHOLOGY Evelina Bucy, DPM 05/19/2021 1423   A : LEFT TALIS BONE FROM LEFT FOOT Tissue Foot, Left FUNGUS CULTURE WITH STAIN, AEROBIC/ANAEROBIC CULTURE W GRAM STAIN (SURGICAL/DEEP WOUND), ACID FAST CULTURE WITH REFLEXED SENSITIVITIES (MYCOBACTERIA), ACID FAST SMEAR (AFB, MYCOBACTERIA) Evelina Bucy, DPM 05/19/2021 1422    Anesthesia: Mac/local Hemostasis: Anatomic Estimated Blood Loss: 5 ml Materials:  Puraply MZ Powder Puraply AM Graft Medications: 1g vancomycin powder, 10 cc 0.5% marcaine plain Complications: None  Indications for Procedure:  This is a 65 y.o. male with a chronic left foot wound 2/2 Charcot deformity. We continue to proceed with salvage for this severe condition. At his last visit it was discussed he would benefit from wound debridement and bone biopsy. All risks, benefits, and alternatives were discussed. No guarantees given.   Procedure in Detail: Patient was identified in pre-operative holding area. Formal consent was signed and the left lower extremity was marked. Patient was brought back to the operating room and placed on the operating room table in the supine position. Anesthesia was induced.   The extremity was prepped and draped in the usual sterile fashion. Timeout was taken to confirm patient name, laterality, and procedure prior to incision. Attention was then directed to the left foot where a wound measuring 5*5 was  encountered.  The talus bone was exposed. There was a small area of more grey discoloration. This was biopsied with Jamshidi needle for micro and pathology. The bone itself was firm and had bleeding upon biopsy.  The wound was sharply excisionally debrided with a 15 blade, and was thoroughly irrigated with pulse lavage with 3L of saline. Debridement was performed to bleeding, viable wound base. The wound was debrided to the level of the bone tissue. The bone itself was not debrided. Following debridement the wound measured 5x5.5.  Purpaly MZ powder was mixed with vancomycin powder and a small amount of saline to form a paste. This was packed into the wound and applied over the bone. Puraply AM was then applied over the remainder of hte wound. This was hydrated with saline and covered with mepitel.  A wound VAC was applied, with a black sponge adhered to the wound bed, followed by occlusive dressing. The trackpad was placed and set to the wound VAC machine at 125 mmHg.   The foot was then dressed with 4x4, kerlix, and ACE bandage. Patient tolerated the procedure well.   Disposition: Following a period of post-operative monitoring, patient will be transferred back home.

## 2021-05-19 NOTE — Interval H&P Note (Signed)
History and Physical Interval Note:  05/19/2021 12:32 PM  [name]  has presented today for surgery, with the diagnosis of Ulcer left foot.  The various methods of treatment have been discussed with the patient and family. After consideration of risks, benefits and other options for treatment, the patient has consented to  Procedure(s): DEBRIDEMENT WOUND (Left) BONE BIOPSY (Left) as a surgical intervention.  The patient's history has been reviewed, patient examined, no change in status, stable for surgery.  I have reviewed the patient's chart and labs.  Questions were answered to the patient's satisfaction.     Park Liter

## 2021-05-19 NOTE — Telephone Encounter (Signed)
Called patient's wife for post-op update 

## 2021-05-19 NOTE — Anesthesia Preprocedure Evaluation (Addendum)
Anesthesia Evaluation  Patient identified by MRN, date of birth, ID band Patient awake    Reviewed: Allergy & Precautions, NPO status , Patient's Chart, lab work & pertinent test results, reviewed documented beta blocker date and time   History of Anesthesia Complications Negative for: history of anesthetic complications  Airway Mallampati: I  TM Distance: >3 FB Neck ROM: Full    Dental  (+) Dental Advisory Given, Teeth Intact   Pulmonary neg pulmonary ROS,    breath sounds clear to auscultation       Cardiovascular hypertension, Pt. on medications and Pt. on home beta blockers (-) angina+ CAD, + Past MI and + CABG (x3, 2019)   Rhythm:Regular Rate:Normal  '19 TEE: EF of 35-40%.  Wall motion is abnormal. Global hypokinesis with severe inferior hypokinesis, mild MR, patent foramen ovale   Neuro/Psych Peripheral neuropathy    GI/Hepatic Neg liver ROS, GERD  Controlled,  Endo/Other  diabetes (glu 132), Insulin Dependent, Oral Hypoglycemic Agents  Renal/GU negative Renal ROS     Musculoskeletal   Abdominal   Peds  Hematology negative hematology ROS (+)   Anesthesia Other Findings   Reproductive/Obstetrics                            Anesthesia Physical Anesthesia Plan  ASA: 3  Anesthesia Plan: MAC   Post-op Pain Management: Minimal or no pain anticipated   Induction:   PONV Risk Score and Plan: 1 and Treatment may vary due to age or medical condition  Airway Management Planned: Natural Airway and Simple Face Mask  Additional Equipment: None  Intra-op Plan:   Post-operative Plan:   Informed Consent: I have reviewed the patients History and Physical, chart, labs and discussed the procedure including the risks, benefits and alternatives for the proposed anesthesia with the patient or authorized representative who has indicated his/her understanding and acceptance.     Dental  advisory given  Plan Discussed with: CRNA and Surgeon  Anesthesia Plan Comments:        Anesthesia Quick Evaluation

## 2021-05-20 ENCOUNTER — Encounter (HOSPITAL_COMMUNITY): Payer: Self-pay | Admitting: Podiatry

## 2021-05-20 DIAGNOSIS — T8131XA Disruption of external operation (surgical) wound, not elsewhere classified, initial encounter: Secondary | ICD-10-CM | POA: Diagnosis not present

## 2021-05-21 DIAGNOSIS — D649 Anemia, unspecified: Secondary | ICD-10-CM | POA: Diagnosis not present

## 2021-05-21 DIAGNOSIS — I252 Old myocardial infarction: Secondary | ICD-10-CM | POA: Diagnosis not present

## 2021-05-21 DIAGNOSIS — E1169 Type 2 diabetes mellitus with other specified complication: Secondary | ICD-10-CM | POA: Diagnosis not present

## 2021-05-21 DIAGNOSIS — E785 Hyperlipidemia, unspecified: Secondary | ICD-10-CM | POA: Diagnosis not present

## 2021-05-21 DIAGNOSIS — I10 Essential (primary) hypertension: Secondary | ICD-10-CM | POA: Diagnosis not present

## 2021-05-21 DIAGNOSIS — M1990 Unspecified osteoarthritis, unspecified site: Secondary | ICD-10-CM | POA: Diagnosis not present

## 2021-05-21 DIAGNOSIS — L97528 Non-pressure chronic ulcer of other part of left foot with other specified severity: Secondary | ICD-10-CM | POA: Diagnosis not present

## 2021-05-21 DIAGNOSIS — Z7982 Long term (current) use of aspirin: Secondary | ICD-10-CM | POA: Diagnosis not present

## 2021-05-21 DIAGNOSIS — Z794 Long term (current) use of insulin: Secondary | ICD-10-CM | POA: Diagnosis not present

## 2021-05-21 DIAGNOSIS — E1140 Type 2 diabetes mellitus with diabetic neuropathy, unspecified: Secondary | ICD-10-CM | POA: Diagnosis not present

## 2021-05-21 DIAGNOSIS — I2510 Atherosclerotic heart disease of native coronary artery without angina pectoris: Secondary | ICD-10-CM | POA: Diagnosis not present

## 2021-05-21 DIAGNOSIS — Z951 Presence of aortocoronary bypass graft: Secondary | ICD-10-CM | POA: Diagnosis not present

## 2021-05-21 DIAGNOSIS — E11621 Type 2 diabetes mellitus with foot ulcer: Secondary | ICD-10-CM | POA: Diagnosis not present

## 2021-05-21 DIAGNOSIS — M869 Osteomyelitis, unspecified: Secondary | ICD-10-CM | POA: Diagnosis not present

## 2021-05-21 DIAGNOSIS — Z7984 Long term (current) use of oral hypoglycemic drugs: Secondary | ICD-10-CM | POA: Diagnosis not present

## 2021-05-21 DIAGNOSIS — T8131XA Disruption of external operation (surgical) wound, not elsewhere classified, initial encounter: Secondary | ICD-10-CM | POA: Diagnosis not present

## 2021-05-21 DIAGNOSIS — K219 Gastro-esophageal reflux disease without esophagitis: Secondary | ICD-10-CM | POA: Diagnosis not present

## 2021-05-21 LAB — ACID FAST SMEAR (AFB, MYCOBACTERIA): Acid Fast Smear: NEGATIVE

## 2021-05-21 LAB — SURGICAL PATHOLOGY

## 2021-05-22 DIAGNOSIS — T8131XA Disruption of external operation (surgical) wound, not elsewhere classified, initial encounter: Secondary | ICD-10-CM | POA: Diagnosis not present

## 2021-05-23 DIAGNOSIS — T8131XA Disruption of external operation (surgical) wound, not elsewhere classified, initial encounter: Secondary | ICD-10-CM | POA: Diagnosis not present

## 2021-05-24 DIAGNOSIS — E11621 Type 2 diabetes mellitus with foot ulcer: Secondary | ICD-10-CM | POA: Diagnosis not present

## 2021-05-24 DIAGNOSIS — I2510 Atherosclerotic heart disease of native coronary artery without angina pectoris: Secondary | ICD-10-CM | POA: Diagnosis not present

## 2021-05-24 DIAGNOSIS — L97522 Non-pressure chronic ulcer of other part of left foot with fat layer exposed: Secondary | ICD-10-CM | POA: Diagnosis not present

## 2021-05-24 DIAGNOSIS — M1990 Unspecified osteoarthritis, unspecified site: Secondary | ICD-10-CM | POA: Diagnosis not present

## 2021-05-24 DIAGNOSIS — E1140 Type 2 diabetes mellitus with diabetic neuropathy, unspecified: Secondary | ICD-10-CM | POA: Diagnosis not present

## 2021-05-24 DIAGNOSIS — I252 Old myocardial infarction: Secondary | ICD-10-CM | POA: Diagnosis not present

## 2021-05-24 DIAGNOSIS — Z7982 Long term (current) use of aspirin: Secondary | ICD-10-CM | POA: Diagnosis not present

## 2021-05-24 DIAGNOSIS — E785 Hyperlipidemia, unspecified: Secondary | ICD-10-CM | POA: Diagnosis not present

## 2021-05-24 DIAGNOSIS — T8131XA Disruption of external operation (surgical) wound, not elsewhere classified, initial encounter: Secondary | ICD-10-CM | POA: Diagnosis not present

## 2021-05-24 DIAGNOSIS — K219 Gastro-esophageal reflux disease without esophagitis: Secondary | ICD-10-CM | POA: Diagnosis not present

## 2021-05-24 DIAGNOSIS — Z9181 History of falling: Secondary | ICD-10-CM | POA: Diagnosis not present

## 2021-05-24 DIAGNOSIS — I10 Essential (primary) hypertension: Secondary | ICD-10-CM | POA: Diagnosis not present

## 2021-05-24 DIAGNOSIS — Z7984 Long term (current) use of oral hypoglycemic drugs: Secondary | ICD-10-CM | POA: Diagnosis not present

## 2021-05-24 DIAGNOSIS — D649 Anemia, unspecified: Secondary | ICD-10-CM | POA: Diagnosis not present

## 2021-05-24 DIAGNOSIS — Z794 Long term (current) use of insulin: Secondary | ICD-10-CM | POA: Diagnosis not present

## 2021-05-24 DIAGNOSIS — Z951 Presence of aortocoronary bypass graft: Secondary | ICD-10-CM | POA: Diagnosis not present

## 2021-05-24 LAB — AEROBIC/ANAEROBIC CULTURE W GRAM STAIN (SURGICAL/DEEP WOUND): Gram Stain: NONE SEEN

## 2021-05-25 ENCOUNTER — Telehealth: Payer: Self-pay | Admitting: Podiatry

## 2021-05-25 ENCOUNTER — Encounter: Payer: BC Managed Care – PPO | Admitting: Podiatry

## 2021-05-25 DIAGNOSIS — T8131XA Disruption of external operation (surgical) wound, not elsewhere classified, initial encounter: Secondary | ICD-10-CM | POA: Diagnosis not present

## 2021-05-25 NOTE — Telephone Encounter (Signed)
Yes no reason he cannot do that. Just make sure he brings his Consulting civil engineer

## 2021-05-25 NOTE — Telephone Encounter (Signed)
Spoke with patient let him know to take charger with him.  He said thank you, and he appreciates [place] getting back to him quickly,

## 2021-05-25 NOTE — Telephone Encounter (Signed)
Patient called wants to know if it is safe for him to travel about 6 hours this weekend with his wound vac?  He is going to [facility] for the holiday to see family and wants to make sure it is safe for him to do so? Please advise

## 2021-05-26 DIAGNOSIS — Z794 Long term (current) use of insulin: Secondary | ICD-10-CM | POA: Diagnosis not present

## 2021-05-26 DIAGNOSIS — Z7984 Long term (current) use of oral hypoglycemic drugs: Secondary | ICD-10-CM | POA: Diagnosis not present

## 2021-05-26 DIAGNOSIS — I252 Old myocardial infarction: Secondary | ICD-10-CM | POA: Diagnosis not present

## 2021-05-26 DIAGNOSIS — T8131XA Disruption of external operation (surgical) wound, not elsewhere classified, initial encounter: Secondary | ICD-10-CM | POA: Diagnosis not present

## 2021-05-26 DIAGNOSIS — D649 Anemia, unspecified: Secondary | ICD-10-CM | POA: Diagnosis not present

## 2021-05-26 DIAGNOSIS — I2510 Atherosclerotic heart disease of native coronary artery without angina pectoris: Secondary | ICD-10-CM | POA: Diagnosis not present

## 2021-05-26 DIAGNOSIS — Z9181 History of falling: Secondary | ICD-10-CM | POA: Diagnosis not present

## 2021-05-26 DIAGNOSIS — E1140 Type 2 diabetes mellitus with diabetic neuropathy, unspecified: Secondary | ICD-10-CM | POA: Diagnosis not present

## 2021-05-26 DIAGNOSIS — I10 Essential (primary) hypertension: Secondary | ICD-10-CM | POA: Diagnosis not present

## 2021-05-26 DIAGNOSIS — M1990 Unspecified osteoarthritis, unspecified site: Secondary | ICD-10-CM | POA: Diagnosis not present

## 2021-05-26 DIAGNOSIS — L97522 Non-pressure chronic ulcer of other part of left foot with fat layer exposed: Secondary | ICD-10-CM | POA: Diagnosis not present

## 2021-05-26 DIAGNOSIS — E785 Hyperlipidemia, unspecified: Secondary | ICD-10-CM | POA: Diagnosis not present

## 2021-05-26 DIAGNOSIS — E11621 Type 2 diabetes mellitus with foot ulcer: Secondary | ICD-10-CM | POA: Diagnosis not present

## 2021-05-26 DIAGNOSIS — K219 Gastro-esophageal reflux disease without esophagitis: Secondary | ICD-10-CM | POA: Diagnosis not present

## 2021-05-26 DIAGNOSIS — Z951 Presence of aortocoronary bypass graft: Secondary | ICD-10-CM | POA: Diagnosis not present

## 2021-05-26 DIAGNOSIS — Z7982 Long term (current) use of aspirin: Secondary | ICD-10-CM | POA: Diagnosis not present

## 2021-05-27 ENCOUNTER — Encounter: Payer: BC Managed Care – PPO | Admitting: Podiatry

## 2021-05-27 DIAGNOSIS — T8131XA Disruption of external operation (surgical) wound, not elsewhere classified, initial encounter: Secondary | ICD-10-CM | POA: Diagnosis not present

## 2021-05-28 ENCOUNTER — Ambulatory Visit (INDEPENDENT_AMBULATORY_CARE_PROVIDER_SITE_OTHER): Payer: BC Managed Care – PPO | Admitting: Podiatry

## 2021-05-28 ENCOUNTER — Other Ambulatory Visit: Payer: Self-pay

## 2021-05-28 DIAGNOSIS — E11621 Type 2 diabetes mellitus with foot ulcer: Secondary | ICD-10-CM

## 2021-05-28 DIAGNOSIS — T8131XA Disruption of external operation (surgical) wound, not elsewhere classified, initial encounter: Secondary | ICD-10-CM | POA: Diagnosis not present

## 2021-05-28 DIAGNOSIS — L97423 Non-pressure chronic ulcer of left heel and midfoot with necrosis of muscle: Secondary | ICD-10-CM

## 2021-05-28 NOTE — Progress Notes (Signed)
°  Subjective:  Patient ID: [name], male    DOB: Sep 10, 1955,  MRN: [id number]  No chief complaint on file.  65 y.o. male presents for wound care. Hx confirmed with patient. Denies pain or post-op issues. HHC has been changing the VAC. Is going out of town this weekend so his appointment was rescheduled from earlier in the week to today so that the [facility] could be on until Monday. Objective:  Physical Exam: Left foot warm and well perfused. Wound medial arch at navicular area measures 4.5*5 today with exposed tendon and partial talus bone measuring 1*1. Bone slightly grayer in appearance. No purulence noted. No warmth or erythema, no ascending cellulitis. Mild serous drainage only. Mild midfoot edema, no warmth or erythema  12/7 Micro Component 9 d ago   Specimen Description TISSUE LEFT FOOT  Performed at [facility], [address] [place], [place] [address]   Special Requests NONE  Performed at [facility], [address] [place], [place] [address]   Gram Stain NO WBC SEEN  NO ORGANISMS SEEN   Culture RARE CORYNEBACTERIUM STRIATUM  Standardized susceptibility testing for this organism is not available.  NO ANAEROBES ISOLATED  Performed at [facility], [address], [place], [place] [address]   Report Status 05/24/2021 FINAL    12/7 Path FINAL MICROSCOPIC DIAGNOSIS:   A. BONE, TALUS LEFT FOOT, BIOPSY:  Segment of bone with focal osteonecrosis.  Negative for osteomyelitis and malignancy.  Assessment:   1. 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  Ulcer left midfoot -Wound same size overall but with continued coverage of the bone. Repeat VAC today -Reviewed path and micro. Taken together I do not think he has osteomyelitis - pathology negative for this. Likely has some surface bacteria from  the wound - only rare growth. We discussed he is still at risk of further infection of this bone until things further heal.  -Discussed he would benefit from repeat skin graft substitute to enhance healing given delay and high risk of infection. Will order Apligraf for next visit.  Procedure: Wound VAC Application Location: left midfoot Wound Measurement: 4.5 cm x 5 cm x 0.3 cm  Technique: Black foam to wound base, followed by adherent dressing. Set to 125 mmHg with good seal noted. Disposition: Patient tolerated procedure well.   No follow-ups on file.

## 2021-05-29 DIAGNOSIS — T8131XA Disruption of external operation (surgical) wound, not elsewhere classified, initial encounter: Secondary | ICD-10-CM | POA: Diagnosis not present

## 2021-05-30 DIAGNOSIS — T8131XA Disruption of external operation (surgical) wound, not elsewhere classified, initial encounter: Secondary | ICD-10-CM | POA: Diagnosis not present

## 2021-05-31 ENCOUNTER — Ambulatory Visit (INDEPENDENT_AMBULATORY_CARE_PROVIDER_SITE_OTHER): Payer: BC Managed Care – PPO | Admitting: Podiatry

## 2021-05-31 ENCOUNTER — Encounter: Payer: Self-pay | Admitting: Podiatry

## 2021-05-31 VITALS — Temp 96.6°F

## 2021-05-31 DIAGNOSIS — L97423 Non-pressure chronic ulcer of left heel and midfoot with necrosis of muscle: Secondary | ICD-10-CM | POA: Diagnosis not present

## 2021-05-31 DIAGNOSIS — E11621 Type 2 diabetes mellitus with foot ulcer: Secondary | ICD-10-CM

## 2021-05-31 DIAGNOSIS — T8131XA Disruption of external operation (surgical) wound, not elsewhere classified, initial encounter: Secondary | ICD-10-CM | POA: Diagnosis not present

## 2021-05-31 NOTE — Progress Notes (Signed)
°  Subjective:  Patient ID: [name], male    DOB: [birth year],  MRN: [id number]  Chief Complaint  Patient presents with   Routine Post Op    I am doing ok on the left foot and I think I am getting a graph put on today   65 y.o. male presents for wound care. Hx confirmed with patient. Denies pain or post-op issues. Does endorse a little bit of weightbearing when using the bathroom but other than that maintaining NWB and boot at all times. Objective:  Physical Exam: Left foot warm and well perfused. Wound medial arch at navicular area measures 4.5x4.5 today with exposed tendon and partial talus bone measuring 1.5x1.5. Bone healthy and tan in appearance. No purulence noted. No warmth or erythema, no ascending cellulitis. Mild serous drainage only. Mild midfoot edema, no warmth or erythema Assessment:   1. 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  Ulcer left midfoot -Wound smaller than previous overall but slightly more bone exposure today. Repeat VAC today  -Discussed importance of total NWB to prevent worsening. -Discussed he would benefit from repeat skin graft substitute to enhance healing given delay and high risk of infection. Apligraf applied today.  Procedure: Application Skin Graft Substitute Rationale: Wound in need of advanced wound therapy to accelerate healing Pre-Debridement Wound Measurements: 4.5 cm x 4.5 cm x 0.3 cm  Post-Debridement Wound Measurements: same as pre-debridement. Type of Debridement: Selective Tissue Removed: Devitalized soft-tissue Instrumentation: 3 mm dermal curette Skin substitute: Apligraf    Lot #: [id number]    Expiration: 06/11/21 Hydration: graft hydrated with saline Secondary Dressing: mepitel Disposition: Patient tolerated procedure well. Patient to return in 1 week for follow-up.  Procedure: Wound  VAC Application Location: left midfoot Wound Measurement: 4.5 cm x 4.5 cm x 0.3 cm  Technique: Black foam to wound base, followed by adherent dressing. Set to 125 mmHg with good seal noted. Disposition: Patient tolerated procedure well.  No follow-ups on file.

## 2021-06-01 DIAGNOSIS — T8131XA Disruption of external operation (surgical) wound, not elsewhere classified, initial encounter: Secondary | ICD-10-CM | POA: Diagnosis not present

## 2021-06-02 DIAGNOSIS — I252 Old myocardial infarction: Secondary | ICD-10-CM | POA: Diagnosis not present

## 2021-06-02 DIAGNOSIS — E785 Hyperlipidemia, unspecified: Secondary | ICD-10-CM | POA: Diagnosis not present

## 2021-06-02 DIAGNOSIS — L97522 Non-pressure chronic ulcer of other part of left foot with fat layer exposed: Secondary | ICD-10-CM | POA: Diagnosis not present

## 2021-06-02 DIAGNOSIS — Z9181 History of falling: Secondary | ICD-10-CM | POA: Diagnosis not present

## 2021-06-02 DIAGNOSIS — Z7984 Long term (current) use of oral hypoglycemic drugs: Secondary | ICD-10-CM | POA: Diagnosis not present

## 2021-06-02 DIAGNOSIS — I10 Essential (primary) hypertension: Secondary | ICD-10-CM | POA: Diagnosis not present

## 2021-06-02 DIAGNOSIS — M1990 Unspecified osteoarthritis, unspecified site: Secondary | ICD-10-CM | POA: Diagnosis not present

## 2021-06-02 DIAGNOSIS — Z7982 Long term (current) use of aspirin: Secondary | ICD-10-CM | POA: Diagnosis not present

## 2021-06-02 DIAGNOSIS — E1140 Type 2 diabetes mellitus with diabetic neuropathy, unspecified: Secondary | ICD-10-CM | POA: Diagnosis not present

## 2021-06-02 DIAGNOSIS — T8131XA Disruption of external operation (surgical) wound, not elsewhere classified, initial encounter: Secondary | ICD-10-CM | POA: Diagnosis not present

## 2021-06-02 DIAGNOSIS — Z951 Presence of aortocoronary bypass graft: Secondary | ICD-10-CM | POA: Diagnosis not present

## 2021-06-02 DIAGNOSIS — E11621 Type 2 diabetes mellitus with foot ulcer: Secondary | ICD-10-CM | POA: Diagnosis not present

## 2021-06-02 DIAGNOSIS — I2510 Atherosclerotic heart disease of native coronary artery without angina pectoris: Secondary | ICD-10-CM | POA: Diagnosis not present

## 2021-06-02 DIAGNOSIS — K219 Gastro-esophageal reflux disease without esophagitis: Secondary | ICD-10-CM | POA: Diagnosis not present

## 2021-06-02 DIAGNOSIS — Z794 Long term (current) use of insulin: Secondary | ICD-10-CM | POA: Diagnosis not present

## 2021-06-02 DIAGNOSIS — D649 Anemia, unspecified: Secondary | ICD-10-CM | POA: Diagnosis not present

## 2021-06-03 DIAGNOSIS — T8131XA Disruption of external operation (surgical) wound, not elsewhere classified, initial encounter: Secondary | ICD-10-CM | POA: Diagnosis not present

## 2021-06-04 DIAGNOSIS — Z7984 Long term (current) use of oral hypoglycemic drugs: Secondary | ICD-10-CM | POA: Diagnosis not present

## 2021-06-04 DIAGNOSIS — Z951 Presence of aortocoronary bypass graft: Secondary | ICD-10-CM | POA: Diagnosis not present

## 2021-06-04 DIAGNOSIS — K219 Gastro-esophageal reflux disease without esophagitis: Secondary | ICD-10-CM | POA: Diagnosis not present

## 2021-06-04 DIAGNOSIS — I252 Old myocardial infarction: Secondary | ICD-10-CM | POA: Diagnosis not present

## 2021-06-04 DIAGNOSIS — Z7982 Long term (current) use of aspirin: Secondary | ICD-10-CM | POA: Diagnosis not present

## 2021-06-04 DIAGNOSIS — Z9181 History of falling: Secondary | ICD-10-CM | POA: Diagnosis not present

## 2021-06-04 DIAGNOSIS — E11621 Type 2 diabetes mellitus with foot ulcer: Secondary | ICD-10-CM | POA: Diagnosis not present

## 2021-06-04 DIAGNOSIS — I2510 Atherosclerotic heart disease of native coronary artery without angina pectoris: Secondary | ICD-10-CM | POA: Diagnosis not present

## 2021-06-04 DIAGNOSIS — E785 Hyperlipidemia, unspecified: Secondary | ICD-10-CM | POA: Diagnosis not present

## 2021-06-04 DIAGNOSIS — T8131XA Disruption of external operation (surgical) wound, not elsewhere classified, initial encounter: Secondary | ICD-10-CM | POA: Diagnosis not present

## 2021-06-04 DIAGNOSIS — Z794 Long term (current) use of insulin: Secondary | ICD-10-CM | POA: Diagnosis not present

## 2021-06-04 DIAGNOSIS — D649 Anemia, unspecified: Secondary | ICD-10-CM | POA: Diagnosis not present

## 2021-06-04 DIAGNOSIS — M1990 Unspecified osteoarthritis, unspecified site: Secondary | ICD-10-CM | POA: Diagnosis not present

## 2021-06-04 DIAGNOSIS — I10 Essential (primary) hypertension: Secondary | ICD-10-CM | POA: Diagnosis not present

## 2021-06-04 DIAGNOSIS — E1140 Type 2 diabetes mellitus with diabetic neuropathy, unspecified: Secondary | ICD-10-CM | POA: Diagnosis not present

## 2021-06-04 DIAGNOSIS — L97522 Non-pressure chronic ulcer of other part of left foot with fat layer exposed: Secondary | ICD-10-CM | POA: Diagnosis not present

## 2021-06-05 DIAGNOSIS — T8131XA Disruption of external operation (surgical) wound, not elsewhere classified, initial encounter: Secondary | ICD-10-CM | POA: Diagnosis not present

## 2021-06-06 DIAGNOSIS — T8131XA Disruption of external operation (surgical) wound, not elsewhere classified, initial encounter: Secondary | ICD-10-CM | POA: Diagnosis not present

## 2021-06-07 DIAGNOSIS — Z9181 History of falling: Secondary | ICD-10-CM | POA: Diagnosis not present

## 2021-06-07 DIAGNOSIS — D649 Anemia, unspecified: Secondary | ICD-10-CM | POA: Diagnosis not present

## 2021-06-07 DIAGNOSIS — I10 Essential (primary) hypertension: Secondary | ICD-10-CM | POA: Diagnosis not present

## 2021-06-07 DIAGNOSIS — Z7982 Long term (current) use of aspirin: Secondary | ICD-10-CM | POA: Diagnosis not present

## 2021-06-07 DIAGNOSIS — L97522 Non-pressure chronic ulcer of other part of left foot with fat layer exposed: Secondary | ICD-10-CM | POA: Diagnosis not present

## 2021-06-07 DIAGNOSIS — Z794 Long term (current) use of insulin: Secondary | ICD-10-CM | POA: Diagnosis not present

## 2021-06-07 DIAGNOSIS — Z951 Presence of aortocoronary bypass graft: Secondary | ICD-10-CM | POA: Diagnosis not present

## 2021-06-07 DIAGNOSIS — Z7984 Long term (current) use of oral hypoglycemic drugs: Secondary | ICD-10-CM | POA: Diagnosis not present

## 2021-06-07 DIAGNOSIS — I2510 Atherosclerotic heart disease of native coronary artery without angina pectoris: Secondary | ICD-10-CM | POA: Diagnosis not present

## 2021-06-07 DIAGNOSIS — M1990 Unspecified osteoarthritis, unspecified site: Secondary | ICD-10-CM | POA: Diagnosis not present

## 2021-06-07 DIAGNOSIS — E1140 Type 2 diabetes mellitus with diabetic neuropathy, unspecified: Secondary | ICD-10-CM | POA: Diagnosis not present

## 2021-06-07 DIAGNOSIS — E785 Hyperlipidemia, unspecified: Secondary | ICD-10-CM | POA: Diagnosis not present

## 2021-06-07 DIAGNOSIS — K219 Gastro-esophageal reflux disease without esophagitis: Secondary | ICD-10-CM | POA: Diagnosis not present

## 2021-06-07 DIAGNOSIS — E11621 Type 2 diabetes mellitus with foot ulcer: Secondary | ICD-10-CM | POA: Diagnosis not present

## 2021-06-07 DIAGNOSIS — I252 Old myocardial infarction: Secondary | ICD-10-CM | POA: Diagnosis not present

## 2021-06-07 DIAGNOSIS — T8131XA Disruption of external operation (surgical) wound, not elsewhere classified, initial encounter: Secondary | ICD-10-CM | POA: Diagnosis not present

## 2021-06-08 DIAGNOSIS — T8131XA Disruption of external operation (surgical) wound, not elsewhere classified, initial encounter: Secondary | ICD-10-CM | POA: Diagnosis not present

## 2021-06-09 ENCOUNTER — Telehealth: Payer: Self-pay | Admitting: Podiatry

## 2021-06-09 DIAGNOSIS — T8131XA Disruption of external operation (surgical) wound, not elsewhere classified, initial encounter: Secondary | ICD-10-CM | POA: Diagnosis not present

## 2021-06-09 NOTE — Telephone Encounter (Signed)
Patient called and his nurse comes out on Monday and Wednesday to change the dressing on his wound, he would like to know if he can cancel her for today since he is coming into the office tomorrow to see you?

## 2021-06-09 NOTE — Telephone Encounter (Signed)
Not sure if he canceled her or not. I would prefer she change it today even if it has to be changed tomorrow. Or she can remove the VAC and apply just a betadine wet to dry.  Please note I am not in the office on Wednesdays (my surgery day) and as such do not check my messages regularly so time sensitive ones like this should also be routed to the on-call provider.

## 2021-06-09 NOTE — Telephone Encounter (Signed)
Spoke with patient, the nurse did not come out today, so he will bring everything with him tomorrow .

## 2021-06-10 ENCOUNTER — Encounter: Payer: BC Managed Care – PPO | Admitting: Podiatry

## 2021-06-10 DIAGNOSIS — E1140 Type 2 diabetes mellitus with diabetic neuropathy, unspecified: Secondary | ICD-10-CM | POA: Diagnosis not present

## 2021-06-10 DIAGNOSIS — M1990 Unspecified osteoarthritis, unspecified site: Secondary | ICD-10-CM | POA: Diagnosis not present

## 2021-06-10 DIAGNOSIS — I252 Old myocardial infarction: Secondary | ICD-10-CM | POA: Diagnosis not present

## 2021-06-10 DIAGNOSIS — D649 Anemia, unspecified: Secondary | ICD-10-CM | POA: Diagnosis not present

## 2021-06-10 DIAGNOSIS — L97522 Non-pressure chronic ulcer of other part of left foot with fat layer exposed: Secondary | ICD-10-CM | POA: Diagnosis not present

## 2021-06-10 DIAGNOSIS — Z9181 History of falling: Secondary | ICD-10-CM | POA: Diagnosis not present

## 2021-06-10 DIAGNOSIS — I2510 Atherosclerotic heart disease of native coronary artery without angina pectoris: Secondary | ICD-10-CM | POA: Diagnosis not present

## 2021-06-10 DIAGNOSIS — Z7984 Long term (current) use of oral hypoglycemic drugs: Secondary | ICD-10-CM | POA: Diagnosis not present

## 2021-06-10 DIAGNOSIS — E785 Hyperlipidemia, unspecified: Secondary | ICD-10-CM | POA: Diagnosis not present

## 2021-06-10 DIAGNOSIS — Z951 Presence of aortocoronary bypass graft: Secondary | ICD-10-CM | POA: Diagnosis not present

## 2021-06-10 DIAGNOSIS — K219 Gastro-esophageal reflux disease without esophagitis: Secondary | ICD-10-CM | POA: Diagnosis not present

## 2021-06-10 DIAGNOSIS — Z794 Long term (current) use of insulin: Secondary | ICD-10-CM | POA: Diagnosis not present

## 2021-06-10 DIAGNOSIS — Z7982 Long term (current) use of aspirin: Secondary | ICD-10-CM | POA: Diagnosis not present

## 2021-06-10 DIAGNOSIS — T8131XA Disruption of external operation (surgical) wound, not elsewhere classified, initial encounter: Secondary | ICD-10-CM | POA: Diagnosis not present

## 2021-06-10 DIAGNOSIS — I10 Essential (primary) hypertension: Secondary | ICD-10-CM | POA: Diagnosis not present

## 2021-06-10 DIAGNOSIS — E11621 Type 2 diabetes mellitus with foot ulcer: Secondary | ICD-10-CM | POA: Diagnosis not present

## 2021-06-11 DIAGNOSIS — T8131XA Disruption of external operation (surgical) wound, not elsewhere classified, initial encounter: Secondary | ICD-10-CM | POA: Diagnosis not present

## 2021-06-12 DIAGNOSIS — I10 Essential (primary) hypertension: Secondary | ICD-10-CM | POA: Diagnosis not present

## 2021-06-12 DIAGNOSIS — T8131XA Disruption of external operation (surgical) wound, not elsewhere classified, initial encounter: Secondary | ICD-10-CM | POA: Diagnosis not present

## 2021-06-12 DIAGNOSIS — Z7982 Long term (current) use of aspirin: Secondary | ICD-10-CM | POA: Diagnosis not present

## 2021-06-12 DIAGNOSIS — Z7984 Long term (current) use of oral hypoglycemic drugs: Secondary | ICD-10-CM | POA: Diagnosis not present

## 2021-06-12 DIAGNOSIS — D649 Anemia, unspecified: Secondary | ICD-10-CM | POA: Diagnosis not present

## 2021-06-12 DIAGNOSIS — L97522 Non-pressure chronic ulcer of other part of left foot with fat layer exposed: Secondary | ICD-10-CM | POA: Diagnosis not present

## 2021-06-12 DIAGNOSIS — Z9181 History of falling: Secondary | ICD-10-CM | POA: Diagnosis not present

## 2021-06-12 DIAGNOSIS — M1990 Unspecified osteoarthritis, unspecified site: Secondary | ICD-10-CM | POA: Diagnosis not present

## 2021-06-12 DIAGNOSIS — E1140 Type 2 diabetes mellitus with diabetic neuropathy, unspecified: Secondary | ICD-10-CM | POA: Diagnosis not present

## 2021-06-12 DIAGNOSIS — E11621 Type 2 diabetes mellitus with foot ulcer: Secondary | ICD-10-CM | POA: Diagnosis not present

## 2021-06-12 DIAGNOSIS — I2510 Atherosclerotic heart disease of native coronary artery without angina pectoris: Secondary | ICD-10-CM | POA: Diagnosis not present

## 2021-06-12 DIAGNOSIS — K219 Gastro-esophageal reflux disease without esophagitis: Secondary | ICD-10-CM | POA: Diagnosis not present

## 2021-06-12 DIAGNOSIS — I252 Old myocardial infarction: Secondary | ICD-10-CM | POA: Diagnosis not present

## 2021-06-12 DIAGNOSIS — Z794 Long term (current) use of insulin: Secondary | ICD-10-CM | POA: Diagnosis not present

## 2021-06-12 DIAGNOSIS — E785 Hyperlipidemia, unspecified: Secondary | ICD-10-CM | POA: Diagnosis not present

## 2021-06-12 DIAGNOSIS — Z951 Presence of aortocoronary bypass graft: Secondary | ICD-10-CM | POA: Diagnosis not present

## 2021-06-13 DIAGNOSIS — T8131XA Disruption of external operation (surgical) wound, not elsewhere classified, initial encounter: Secondary | ICD-10-CM | POA: Diagnosis not present

## 2021-06-14 DIAGNOSIS — I252 Old myocardial infarction: Secondary | ICD-10-CM | POA: Diagnosis not present

## 2021-06-14 DIAGNOSIS — M1990 Unspecified osteoarthritis, unspecified site: Secondary | ICD-10-CM | POA: Diagnosis not present

## 2021-06-14 DIAGNOSIS — D649 Anemia, unspecified: Secondary | ICD-10-CM | POA: Diagnosis not present

## 2021-06-14 DIAGNOSIS — Z7984 Long term (current) use of oral hypoglycemic drugs: Secondary | ICD-10-CM | POA: Diagnosis not present

## 2021-06-14 DIAGNOSIS — T8131XA Disruption of external operation (surgical) wound, not elsewhere classified, initial encounter: Secondary | ICD-10-CM | POA: Diagnosis not present

## 2021-06-14 DIAGNOSIS — E11621 Type 2 diabetes mellitus with foot ulcer: Secondary | ICD-10-CM | POA: Diagnosis not present

## 2021-06-14 DIAGNOSIS — Z951 Presence of aortocoronary bypass graft: Secondary | ICD-10-CM | POA: Diagnosis not present

## 2021-06-14 DIAGNOSIS — I2510 Atherosclerotic heart disease of native coronary artery without angina pectoris: Secondary | ICD-10-CM | POA: Diagnosis not present

## 2021-06-14 DIAGNOSIS — K219 Gastro-esophageal reflux disease without esophagitis: Secondary | ICD-10-CM | POA: Diagnosis not present

## 2021-06-14 DIAGNOSIS — E1140 Type 2 diabetes mellitus with diabetic neuropathy, unspecified: Secondary | ICD-10-CM | POA: Diagnosis not present

## 2021-06-14 DIAGNOSIS — E785 Hyperlipidemia, unspecified: Secondary | ICD-10-CM | POA: Diagnosis not present

## 2021-06-14 DIAGNOSIS — Z9181 History of falling: Secondary | ICD-10-CM | POA: Diagnosis not present

## 2021-06-14 DIAGNOSIS — I10 Essential (primary) hypertension: Secondary | ICD-10-CM | POA: Diagnosis not present

## 2021-06-14 DIAGNOSIS — Z794 Long term (current) use of insulin: Secondary | ICD-10-CM | POA: Diagnosis not present

## 2021-06-14 DIAGNOSIS — Z7982 Long term (current) use of aspirin: Secondary | ICD-10-CM | POA: Diagnosis not present

## 2021-06-14 DIAGNOSIS — L97522 Non-pressure chronic ulcer of other part of left foot with fat layer exposed: Secondary | ICD-10-CM | POA: Diagnosis not present

## 2021-06-15 DIAGNOSIS — T8131XA Disruption of external operation (surgical) wound, not elsewhere classified, initial encounter: Secondary | ICD-10-CM | POA: Diagnosis not present

## 2021-06-16 DIAGNOSIS — T8131XA Disruption of external operation (surgical) wound, not elsewhere classified, initial encounter: Secondary | ICD-10-CM | POA: Diagnosis not present

## 2021-06-17 ENCOUNTER — Other Ambulatory Visit: Payer: Self-pay

## 2021-06-17 ENCOUNTER — Ambulatory Visit (INDEPENDENT_AMBULATORY_CARE_PROVIDER_SITE_OTHER): Payer: BC Managed Care – PPO | Admitting: Podiatry

## 2021-06-17 DIAGNOSIS — L97423 Non-pressure chronic ulcer of left heel and midfoot with necrosis of muscle: Secondary | ICD-10-CM

## 2021-06-17 DIAGNOSIS — E11621 Type 2 diabetes mellitus with foot ulcer: Secondary | ICD-10-CM | POA: Diagnosis not present

## 2021-06-17 DIAGNOSIS — T8131XA Disruption of external operation (surgical) wound, not elsewhere classified, initial encounter: Secondary | ICD-10-CM | POA: Diagnosis not present

## 2021-06-18 DIAGNOSIS — T8131XA Disruption of external operation (surgical) wound, not elsewhere classified, initial encounter: Secondary | ICD-10-CM | POA: Diagnosis not present

## 2021-06-18 LAB — FUNGUS CULTURE RESULT

## 2021-06-18 LAB — FUNGUS CULTURE WITH STAIN

## 2021-06-18 LAB — FUNGAL ORGANISM REFLEX

## 2021-06-19 DIAGNOSIS — L97522 Non-pressure chronic ulcer of other part of left foot with fat layer exposed: Secondary | ICD-10-CM | POA: Diagnosis not present

## 2021-06-19 DIAGNOSIS — M1990 Unspecified osteoarthritis, unspecified site: Secondary | ICD-10-CM | POA: Diagnosis not present

## 2021-06-19 DIAGNOSIS — Z7984 Long term (current) use of oral hypoglycemic drugs: Secondary | ICD-10-CM | POA: Diagnosis not present

## 2021-06-19 DIAGNOSIS — Z794 Long term (current) use of insulin: Secondary | ICD-10-CM | POA: Diagnosis not present

## 2021-06-19 DIAGNOSIS — E11621 Type 2 diabetes mellitus with foot ulcer: Secondary | ICD-10-CM | POA: Diagnosis not present

## 2021-06-19 DIAGNOSIS — I10 Essential (primary) hypertension: Secondary | ICD-10-CM | POA: Diagnosis not present

## 2021-06-19 DIAGNOSIS — K219 Gastro-esophageal reflux disease without esophagitis: Secondary | ICD-10-CM | POA: Diagnosis not present

## 2021-06-19 DIAGNOSIS — I252 Old myocardial infarction: Secondary | ICD-10-CM | POA: Diagnosis not present

## 2021-06-19 DIAGNOSIS — Z9181 History of falling: Secondary | ICD-10-CM | POA: Diagnosis not present

## 2021-06-19 DIAGNOSIS — D649 Anemia, unspecified: Secondary | ICD-10-CM | POA: Diagnosis not present

## 2021-06-19 DIAGNOSIS — E1140 Type 2 diabetes mellitus with diabetic neuropathy, unspecified: Secondary | ICD-10-CM | POA: Diagnosis not present

## 2021-06-19 DIAGNOSIS — Z7982 Long term (current) use of aspirin: Secondary | ICD-10-CM | POA: Diagnosis not present

## 2021-06-19 DIAGNOSIS — I2510 Atherosclerotic heart disease of native coronary artery without angina pectoris: Secondary | ICD-10-CM | POA: Diagnosis not present

## 2021-06-19 DIAGNOSIS — Z951 Presence of aortocoronary bypass graft: Secondary | ICD-10-CM | POA: Diagnosis not present

## 2021-06-19 DIAGNOSIS — E785 Hyperlipidemia, unspecified: Secondary | ICD-10-CM | POA: Diagnosis not present

## 2021-06-19 DIAGNOSIS — T8131XA Disruption of external operation (surgical) wound, not elsewhere classified, initial encounter: Secondary | ICD-10-CM | POA: Diagnosis not present

## 2021-06-20 DIAGNOSIS — T8131XA Disruption of external operation (surgical) wound, not elsewhere classified, initial encounter: Secondary | ICD-10-CM | POA: Diagnosis not present

## 2021-06-21 DIAGNOSIS — Z951 Presence of aortocoronary bypass graft: Secondary | ICD-10-CM | POA: Diagnosis not present

## 2021-06-21 DIAGNOSIS — E785 Hyperlipidemia, unspecified: Secondary | ICD-10-CM | POA: Diagnosis not present

## 2021-06-21 DIAGNOSIS — I2510 Atherosclerotic heart disease of native coronary artery without angina pectoris: Secondary | ICD-10-CM | POA: Diagnosis not present

## 2021-06-21 DIAGNOSIS — L97522 Non-pressure chronic ulcer of other part of left foot with fat layer exposed: Secondary | ICD-10-CM | POA: Diagnosis not present

## 2021-06-21 DIAGNOSIS — E11621 Type 2 diabetes mellitus with foot ulcer: Secondary | ICD-10-CM | POA: Diagnosis not present

## 2021-06-21 DIAGNOSIS — K219 Gastro-esophageal reflux disease without esophagitis: Secondary | ICD-10-CM | POA: Diagnosis not present

## 2021-06-21 DIAGNOSIS — E1140 Type 2 diabetes mellitus with diabetic neuropathy, unspecified: Secondary | ICD-10-CM | POA: Diagnosis not present

## 2021-06-21 DIAGNOSIS — I10 Essential (primary) hypertension: Secondary | ICD-10-CM | POA: Diagnosis not present

## 2021-06-21 DIAGNOSIS — Z794 Long term (current) use of insulin: Secondary | ICD-10-CM | POA: Diagnosis not present

## 2021-06-21 DIAGNOSIS — Z7982 Long term (current) use of aspirin: Secondary | ICD-10-CM | POA: Diagnosis not present

## 2021-06-21 DIAGNOSIS — Z9181 History of falling: Secondary | ICD-10-CM | POA: Diagnosis not present

## 2021-06-21 DIAGNOSIS — D649 Anemia, unspecified: Secondary | ICD-10-CM | POA: Diagnosis not present

## 2021-06-21 DIAGNOSIS — T8131XA Disruption of external operation (surgical) wound, not elsewhere classified, initial encounter: Secondary | ICD-10-CM | POA: Diagnosis not present

## 2021-06-21 DIAGNOSIS — I252 Old myocardial infarction: Secondary | ICD-10-CM | POA: Diagnosis not present

## 2021-06-21 DIAGNOSIS — M1990 Unspecified osteoarthritis, unspecified site: Secondary | ICD-10-CM | POA: Diagnosis not present

## 2021-06-21 DIAGNOSIS — Z7984 Long term (current) use of oral hypoglycemic drugs: Secondary | ICD-10-CM | POA: Diagnosis not present

## 2021-06-21 NOTE — Progress Notes (Signed)
°  Subjective:  Patient ID: [name], male    DOB: [birth year],  MRN: [id number]  No chief complaint on file.  66 y.o. male presents for wound care. Hx confirmed with patient. Has been told the wound is doing better. Denies other pedal issues today. Still getting VAC changes with H[facility]. Objective:  Physical Exam: Left foot warm and well perfused. Wound medial arch at navicular area measures 4x4 today with exposed tendon and partial talus bone measuring 2x1. Bone healthy and tan in appearance. No purulence noted. No warmth or erythema, no ascending cellulitis. Mild serous drainage only. Mild midfoot edema, no warmth or erythema Assessment:   1. 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  Ulcer left midfoot -Wound again than previous overall but slightly more bone exposure today. Repeat VAC today  -Discussed importance of total NWB to prevent worsening. -Discussed he would benefit from repeat skin graft substitute to enhance healing given delay and high risk of infection. Repeat apligraf applied today.  Procedure: Application Skin Graft Substitute Rationale: Wound in need of advanced wound therapy to accelerate healing Pre-Debridement Wound Measurements: 4 cm x 4 cm x 0.3 cm  Post-Debridement Wound Measurements: same as pre-debridement. Type of Debridement: Selective Tissue Removed: Devitalized soft-tissue Instrumentation: 3 mm dermal curette Skin substitute: Apligraf    Lot #: [id number]    Expiration: 06/18/2021 Hydration: graft hydrated with saline Secondary Dressing: mepitel Disposition: Patient tolerated procedure well. Patient to return in 1 week for follow-up.  Procedure: Wound VAC Application Location: left midfoot Wound Measurement: 4 cm x 4 cm x 0.3 cm  Technique: Black foam to wound base, followed by adherent dressing. Set to 125  mmHg with good seal noted. Disposition: Patient tolerated procedure well.  No follow-ups on file.

## 2021-06-21 NOTE — Telephone Encounter (Signed)
Please advise 

## 2021-06-22 DIAGNOSIS — T8131XA Disruption of external operation (surgical) wound, not elsewhere classified, initial encounter: Secondary | ICD-10-CM | POA: Diagnosis not present

## 2021-06-23 DIAGNOSIS — T8131XA Disruption of external operation (surgical) wound, not elsewhere classified, initial encounter: Secondary | ICD-10-CM | POA: Diagnosis not present

## 2021-06-24 ENCOUNTER — Other Ambulatory Visit: Payer: Self-pay

## 2021-06-24 ENCOUNTER — Encounter: Payer: Self-pay | Admitting: Podiatry

## 2021-06-24 ENCOUNTER — Ambulatory Visit (INDEPENDENT_AMBULATORY_CARE_PROVIDER_SITE_OTHER): Payer: BC Managed Care – PPO | Admitting: Podiatry

## 2021-06-24 ENCOUNTER — Ambulatory Visit: Payer: Self-pay | Admitting: Podiatry

## 2021-06-24 VITALS — Temp 96.9°F

## 2021-06-24 DIAGNOSIS — L97423 Non-pressure chronic ulcer of left heel and midfoot with necrosis of muscle: Secondary | ICD-10-CM | POA: Diagnosis not present

## 2021-06-24 DIAGNOSIS — E11621 Type 2 diabetes mellitus with foot ulcer: Secondary | ICD-10-CM

## 2021-06-24 DIAGNOSIS — T8131XA Disruption of external operation (surgical) wound, not elsewhere classified, initial encounter: Secondary | ICD-10-CM | POA: Diagnosis not present

## 2021-06-25 ENCOUNTER — Telehealth: Payer: Self-pay | Admitting: Urology

## 2021-06-25 DIAGNOSIS — T8131XA Disruption of external operation (surgical) wound, not elsewhere classified, initial encounter: Secondary | ICD-10-CM | POA: Diagnosis not present

## 2021-06-25 NOTE — Telephone Encounter (Addendum)
DOS - 07/02/21  WOUND DEBRIDEMENT LT --- [id number] SKIN GRAFT --- [id number] BONE BIOPSY ---  20220   [employer] EFFECTIVE DATE - 06/14/15   SPOKE WITH ERIC WITH [employer] AND HE STATED THAT FOR CPT CODES 63875, 20290 AND 64332 NO PRIOR AUTH IS REQUIRED.   REF # I [id number]  07/05/21 SPOKE WITH JAY EMERSON RN WITH [employer] AND HE STATED THAT CPT  CODE 95188 HAS BEEN APPROVED, AUTH # [id number], GOOD FROM 07/02/21 - 07/31/21.  REF # [id number]

## 2021-06-26 DIAGNOSIS — Z7982 Long term (current) use of aspirin: Secondary | ICD-10-CM | POA: Diagnosis not present

## 2021-06-26 DIAGNOSIS — E1140 Type 2 diabetes mellitus with diabetic neuropathy, unspecified: Secondary | ICD-10-CM | POA: Diagnosis not present

## 2021-06-26 DIAGNOSIS — M1990 Unspecified osteoarthritis, unspecified site: Secondary | ICD-10-CM | POA: Diagnosis not present

## 2021-06-26 DIAGNOSIS — T8131XA Disruption of external operation (surgical) wound, not elsewhere classified, initial encounter: Secondary | ICD-10-CM | POA: Diagnosis not present

## 2021-06-26 DIAGNOSIS — Z9181 History of falling: Secondary | ICD-10-CM | POA: Diagnosis not present

## 2021-06-26 DIAGNOSIS — I252 Old myocardial infarction: Secondary | ICD-10-CM | POA: Diagnosis not present

## 2021-06-26 DIAGNOSIS — I2510 Atherosclerotic heart disease of native coronary artery without angina pectoris: Secondary | ICD-10-CM | POA: Diagnosis not present

## 2021-06-26 DIAGNOSIS — I10 Essential (primary) hypertension: Secondary | ICD-10-CM | POA: Diagnosis not present

## 2021-06-26 DIAGNOSIS — Z794 Long term (current) use of insulin: Secondary | ICD-10-CM | POA: Diagnosis not present

## 2021-06-26 DIAGNOSIS — E785 Hyperlipidemia, unspecified: Secondary | ICD-10-CM | POA: Diagnosis not present

## 2021-06-26 DIAGNOSIS — Z7984 Long term (current) use of oral hypoglycemic drugs: Secondary | ICD-10-CM | POA: Diagnosis not present

## 2021-06-26 DIAGNOSIS — D649 Anemia, unspecified: Secondary | ICD-10-CM | POA: Diagnosis not present

## 2021-06-26 DIAGNOSIS — K219 Gastro-esophageal reflux disease without esophagitis: Secondary | ICD-10-CM | POA: Diagnosis not present

## 2021-06-26 DIAGNOSIS — E11621 Type 2 diabetes mellitus with foot ulcer: Secondary | ICD-10-CM | POA: Diagnosis not present

## 2021-06-26 DIAGNOSIS — Z951 Presence of aortocoronary bypass graft: Secondary | ICD-10-CM | POA: Diagnosis not present

## 2021-06-26 DIAGNOSIS — L97522 Non-pressure chronic ulcer of other part of left foot with fat layer exposed: Secondary | ICD-10-CM | POA: Diagnosis not present

## 2021-06-27 DIAGNOSIS — T8131XA Disruption of external operation (surgical) wound, not elsewhere classified, initial encounter: Secondary | ICD-10-CM | POA: Diagnosis not present

## 2021-06-28 DIAGNOSIS — L97522 Non-pressure chronic ulcer of other part of left foot with fat layer exposed: Secondary | ICD-10-CM | POA: Diagnosis not present

## 2021-06-28 DIAGNOSIS — T8131XA Disruption of external operation (surgical) wound, not elsewhere classified, initial encounter: Secondary | ICD-10-CM | POA: Diagnosis not present

## 2021-06-28 DIAGNOSIS — Z9181 History of falling: Secondary | ICD-10-CM | POA: Diagnosis not present

## 2021-06-28 DIAGNOSIS — E1140 Type 2 diabetes mellitus with diabetic neuropathy, unspecified: Secondary | ICD-10-CM | POA: Diagnosis not present

## 2021-06-28 DIAGNOSIS — M1990 Unspecified osteoarthritis, unspecified site: Secondary | ICD-10-CM | POA: Diagnosis not present

## 2021-06-28 DIAGNOSIS — E7800 Pure hypercholesterolemia, unspecified: Secondary | ICD-10-CM | POA: Diagnosis not present

## 2021-06-28 DIAGNOSIS — D649 Anemia, unspecified: Secondary | ICD-10-CM | POA: Diagnosis not present

## 2021-06-28 DIAGNOSIS — I10 Essential (primary) hypertension: Secondary | ICD-10-CM | POA: Diagnosis not present

## 2021-06-28 DIAGNOSIS — Z951 Presence of aortocoronary bypass graft: Secondary | ICD-10-CM | POA: Diagnosis not present

## 2021-06-28 DIAGNOSIS — E785 Hyperlipidemia, unspecified: Secondary | ICD-10-CM | POA: Diagnosis not present

## 2021-06-28 DIAGNOSIS — Z794 Long term (current) use of insulin: Secondary | ICD-10-CM | POA: Diagnosis not present

## 2021-06-28 DIAGNOSIS — Z1331 Encounter for screening for depression: Secondary | ICD-10-CM | POA: Diagnosis not present

## 2021-06-28 DIAGNOSIS — I2510 Atherosclerotic heart disease of native coronary artery without angina pectoris: Secondary | ICD-10-CM | POA: Diagnosis not present

## 2021-06-28 DIAGNOSIS — I252 Old myocardial infarction: Secondary | ICD-10-CM | POA: Diagnosis not present

## 2021-06-28 DIAGNOSIS — E11621 Type 2 diabetes mellitus with foot ulcer: Secondary | ICD-10-CM | POA: Diagnosis not present

## 2021-06-28 DIAGNOSIS — Z7982 Long term (current) use of aspirin: Secondary | ICD-10-CM | POA: Diagnosis not present

## 2021-06-28 DIAGNOSIS — Z7984 Long term (current) use of oral hypoglycemic drugs: Secondary | ICD-10-CM | POA: Diagnosis not present

## 2021-06-28 DIAGNOSIS — K219 Gastro-esophageal reflux disease without esophagitis: Secondary | ICD-10-CM | POA: Diagnosis not present

## 2021-06-29 ENCOUNTER — Other Ambulatory Visit: Payer: Self-pay

## 2021-06-29 ENCOUNTER — Telehealth: Payer: Self-pay | Admitting: Podiatry

## 2021-06-29 ENCOUNTER — Encounter (HOSPITAL_BASED_OUTPATIENT_CLINIC_OR_DEPARTMENT_OTHER): Payer: Self-pay | Admitting: Podiatry

## 2021-06-29 DIAGNOSIS — T8131XA Disruption of external operation (surgical) wound, not elsewhere classified, initial encounter: Secondary | ICD-10-CM | POA: Diagnosis not present

## 2021-06-29 NOTE — Telephone Encounter (Signed)
Pt wants to know if home health needs to come Monday before surgery to change wound vac since surgery was moved from this Friday to Tues.  He said they are going to come this wk but wasn't sure about Monday.

## 2021-06-29 NOTE — Progress Notes (Signed)
REVIEWED PT CHART INCLUDING ANESTHESIA RECORD FOR LAST SURGERY AT [facility] 05-19-2021.  PT HAS EF 35%.  DUE TO AMBULATORY GUIDELINES FOR SURGERY CENTER PT WILL NEED TO BE DONE AT MAIN OR.  CALLED AND SPOKE W/ CHELLE, OR SCHEDULER FOR DR PRICE, INFORMED NEED TO MOVE PT TO MAIN OR AND WHY.

## 2021-06-29 NOTE — Telephone Encounter (Signed)
Noted thanks - I think the [facility] nurse can eval tomorrow and if anything is up they can let me know

## 2021-06-29 NOTE — Telephone Encounter (Signed)
Per wife pt just fell going down ramp and she wants  to know since pt does not feel anything should they come in to be seen.

## 2021-06-29 NOTE — Telephone Encounter (Signed)
Patient's wife called and stated that the patient slipped off the scooter due to the patient's pants were the slick kind and when wife went around to the other side of the car the left foot and leg was laying on the handlebar and I called patient's wife back and the patient got on the phone and stated that he was not hurt and he could wiggle his toes and they stopped by the office and I evaluated the patient car side and I took the boot off and felt around the wound vac and there was no draining and there was some blood in the wound vac line and there was not any swelling and the patient could wiggle the toes and I asked if patient hit his head and patient stated he did not and that he was not in any pain and patient stated that the home health nurse is coming out tomorrow and I stated to let the nurse check and explain what happened and to call the office if any concerns or questions. Lattie Haw

## 2021-06-29 NOTE — Progress Notes (Signed)
Spoke w/ via phone for pre-op interview---patient Lab needs dos----      [facility]         Lab results------EKG and Echo COVID test -----patient states asymptomatic no test needed Arrive at -------0730 06/01/22 NPO after MN NO Solid Food.  Clear liquids from MN until 0430 Med rec completed Medications to take morning of surgery -----metoprolol only; Diabetic medication -----take half lantus night before Patient instructed no nail polish to be worn day of surgery Patient instructed to bring photo id and insurance card day of surgery Patient aware to have Driver (ride ) / caregiver    for 24 hours after surgery  Patient Special Instructions ----- Pre-Op special Istructions ----- Patient verbalized understanding of instructions that were given at this phone interview. Patient denies shortness of breath, chest pain, fever, cough at this phone interview.

## 2021-06-30 ENCOUNTER — Telehealth: Payer: Self-pay | Admitting: *Deleted

## 2021-06-30 DIAGNOSIS — Z794 Long term (current) use of insulin: Secondary | ICD-10-CM | POA: Diagnosis not present

## 2021-06-30 DIAGNOSIS — Z951 Presence of aortocoronary bypass graft: Secondary | ICD-10-CM | POA: Diagnosis not present

## 2021-06-30 DIAGNOSIS — I252 Old myocardial infarction: Secondary | ICD-10-CM | POA: Diagnosis not present

## 2021-06-30 DIAGNOSIS — K219 Gastro-esophageal reflux disease without esophagitis: Secondary | ICD-10-CM | POA: Diagnosis not present

## 2021-06-30 DIAGNOSIS — I2510 Atherosclerotic heart disease of native coronary artery without angina pectoris: Secondary | ICD-10-CM | POA: Diagnosis not present

## 2021-06-30 DIAGNOSIS — Z7984 Long term (current) use of oral hypoglycemic drugs: Secondary | ICD-10-CM | POA: Diagnosis not present

## 2021-06-30 DIAGNOSIS — D649 Anemia, unspecified: Secondary | ICD-10-CM | POA: Diagnosis not present

## 2021-06-30 DIAGNOSIS — M1990 Unspecified osteoarthritis, unspecified site: Secondary | ICD-10-CM | POA: Diagnosis not present

## 2021-06-30 DIAGNOSIS — Z9181 History of falling: Secondary | ICD-10-CM | POA: Diagnosis not present

## 2021-06-30 DIAGNOSIS — Z7982 Long term (current) use of aspirin: Secondary | ICD-10-CM | POA: Diagnosis not present

## 2021-06-30 DIAGNOSIS — T8131XA Disruption of external operation (surgical) wound, not elsewhere classified, initial encounter: Secondary | ICD-10-CM | POA: Diagnosis not present

## 2021-06-30 DIAGNOSIS — E785 Hyperlipidemia, unspecified: Secondary | ICD-10-CM | POA: Diagnosis not present

## 2021-06-30 DIAGNOSIS — E11621 Type 2 diabetes mellitus with foot ulcer: Secondary | ICD-10-CM | POA: Diagnosis not present

## 2021-06-30 DIAGNOSIS — E1140 Type 2 diabetes mellitus with diabetic neuropathy, unspecified: Secondary | ICD-10-CM | POA: Diagnosis not present

## 2021-06-30 DIAGNOSIS — L97522 Non-pressure chronic ulcer of other part of left foot with fat layer exposed: Secondary | ICD-10-CM | POA: Diagnosis not present

## 2021-06-30 DIAGNOSIS — I10 Essential (primary) hypertension: Secondary | ICD-10-CM | POA: Diagnosis not present

## 2021-06-30 NOTE — Telephone Encounter (Signed)
-----   Message from Landis Martins, [place] sent at 06/25/2021  5:28 PM EST ----- Keep with wound vac. Dr. March Rummage will update nurse with orders after surgery ----- Message ----- From: Viviana Simpler, [facility] Sent: 06/25/2021   4:12 PM EST To: Landis Martins, DPM, Evelina Bucy, DPM  Nurse called and stated that she needed updated orders for the patient and patient is having surgery next Friday 07-02-2021. Please advise Lattie Haw Juliann Pulse [phone])

## 2021-06-30 NOTE — Progress Notes (Signed)
Your procedure is scheduled on:            07/06/21   Report to [facility] Main  Entrance   Report to admitting at   0715 AM     Call this number if you have problems the morning of surgery [phone]    REMEMBER: NO  SOLID FOOD CANDY OR GUM AFTER MIDNIGHT. CLEAR LIQUIDS UNTIL   0630AM        . NOTHING BY MOUTH EXCEPT CLEAR LIQUIDS UNTIL 0630AM    . PLEASE FINISH  g2 lOWER sUGAR DRINK PER SURGEON ORDER  WHICH NEEDS TO BE COMPLETED AT  [id number]   .      CLEAR LIQUID DIET   Foods Allowed                                                                    Coffee and tea, regular and decaf                            Fruit ices (not with fruit pulp)                                      Iced Popsicles                                    Carbonated beverages, regular and diet                                    Cranberry, grape and apple juices Sports drinks like Gatorade Lightly seasoned clear broth or consume(fat free) Sugar, honey syrup ___________________________________________________________________      BRUSH YOUR TEETH MORNING OF SURGERY AND RINSE YOUR MOUTH OUT, NO CHEWING GUM CANDY OR MINTS.     Take these medicines the morning of surgery with A SIP OF WATER:  TOPROL   DO NOT TAKE ANY DIABETIC MEDICATIONS DAY OF YOUR SURGERY                               You may not have any metal on your body including hair pins and              piercings  Do not wear jewelry, make-up, lotions, powders or perfumes, deodorant             Do not wear nail polish on your fingernails.  Do not shave  48 hours prior to surgery.              Men may shave face and neck.   Do not bring valuables to the hospital. [facility] IS NOT             RESPONSIBLE   FOR VALUABLES.  Contacts, dentures or bridgework may not be worn into surgery.  Leave suitcase in the car. After surgery it may be brought to your room.     Patients  discharged the day of surgery will not be  allowed to drive home. IF YOU ARE HAVING SURGERY AND GOING HOME THE SAME DAY, YOU MUST HAVE AN ADULT TO DRIVE YOU HOME AND BE WITH YOU FOR 24 HOURS. YOU MAY GO HOME BY TAXI OR UBER OR ORTHERWISE, BUT AN ADULT MUST ACCOMPANY YOU HOME AND STAY WITH YOU FOR 24 HOURS.  Name and phone number of your driver:  Special Instructions: N/A              Please read over the following fact sheets you were given: _____________________________________________________________________  [facility] - Preparing for Surgery Before surgery, you can play an important role.  Because skin is not sterile, your skin needs to be as free of germs as possible.  You can reduce the number of germs on your skin by washing with CHG (chlorahexidine gluconate) soap before surgery.  CHG is an antiseptic cleaner which kills germs and bonds with the skin to continue killing germs even after washing. Please DO NOT use if you have an allergy to CHG or antibacterial soaps.  If your skin becomes reddened/irritated stop using the CHG and inform your nurse when you arrive at Short Stay. Do not shave (including legs and underarms) for at least 48 hours prior to the first CHG shower.  You may shave your face/neck. Please follow these instructions carefully:  1.  Shower with CHG Soap the night before surgery and the  morning of Surgery.  2.  If you choose to wash your hair, wash your hair first as usual with your  normal  shampoo.  3.  After you shampoo, rinse your hair and body thoroughly to remove the  shampoo.                           4.  Use CHG as you would any other liquid soap.  You can apply chg directly  to the skin and wash                       Gently with a scrungie or clean washcloth.  5.  Apply the CHG Soap to your body ONLY FROM THE NECK DOWN.   Do not use on face/ open                           Wound or open sores. Avoid contact with eyes, ears mouth and genitals (private parts).                       Wash face,  Genitals  (private parts) with your normal soap.             6.  Wash thoroughly, paying special attention to the area where your surgery  will be performed.  7.  Thoroughly rinse your body with warm water from the neck down.  8.  DO NOT shower/wash with your normal soap after using and rinsing off  the CHG Soap.                9.  Pat yourself dry with a clean towel.            10.  Wear clean pajamas.            11.  Place clean sheets on your bed the night of your first shower and do not  sleep with pets.  Day of Surgery : Do not apply any lotions/deodorants the morning of surgery.  Please wear clean clothes to the hospital/surgery center.  FAILURE TO FOLLOW THESE INSTRUCTIONS MAY RESULT IN THE CANCELLATION OF YOUR SURGERY PATIENT SIGNATURE_________________________________  NURSE SIGNATURE__________________________________  ________________________________________________________________________

## 2021-06-30 NOTE — Progress Notes (Addendum)
Anesthesia Review:  PCP: DR Luster Landsberg and P 06/28/21 on chart.  Cardiologist : DR Armando Gang 04/08/21  Chest x-ray : EKG :04/08/21  vASC- 03/15/21  Echo : 2019  Stress test: Cardiac Cath :  Activity level: currently is in wheelchair due to wound but could do a flight of staris without difficulty  Sleep Study/ CPAP : none  Fasting Blood Sugar :      / Checks Blood Sugar -- times a day:   Blood Thinner/ Instructions /Last Dose: ASA / Instructions/ Last Dose :   81 MG aSPIRIN  NO COVID  TEST- AMBULATORY SURGERY  DM- TYPE 2- checks glucose 3 times daily  HGBA1C- 07/02/21- 8.5 - routed to DR PRice on 07/05/21.

## 2021-06-30 NOTE — Telephone Encounter (Signed)
Called and spoke with the home health nurse on Friday 06-25-2021 and gave verbal orders per Dr Marylene Land. Misty Stanley

## 2021-07-01 DIAGNOSIS — T8131XA Disruption of external operation (surgical) wound, not elsewhere classified, initial encounter: Secondary | ICD-10-CM | POA: Diagnosis not present

## 2021-07-01 NOTE — H&P (View-Only) (Signed)
°  Subjective:  °Patient ID: [name], male    DOB: 04/23/1956,  MRN: [id number] ° °Chief Complaint  °Patient presents with  ° Routine Post Op  °  I am doing ok on the left foot and the boot is really heavy  ° °65 y.o. male presents for wound care. Hx confirmed with patient. Denies new issues with the foot. No pain today. Oral temp 96.9.  °Objective:  °Physical Exam: °Left foot warm and well perfused. Wound medial arch at navicular area measures 4x4 today with exposed tendon and partial talus bone measuring 2x2. Bone healthy and tan in appearance. No purulence noted. No warmth or erythema, no ascending cellulitis. Mild serous drainage only. °Mild midfoot edema, no warmth or erythema °Assessment:  ° °1. 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 ° °Ulcer left midfoot °-Wound similar in size but with some fibrosis. More exposed bone today °-Will plan for debridement of the wound to promote healing, along with skin graft substitute and bone biopsy. °-Patient has failed conservative therapy and wishes to proceed with surgical intervention. All risks, benefits, and alternatives discussed with patient. No guarantees given. Consent reviewed and signed by patient. °-Planned procedures: Debridement left foot wound, application skin graft substitute, bone biopsy.  °-Wound VAC reapplied. ° °Procedure: Wound VAC Application °Location: left midfoot °Wound Measurement: 4 cm x 4 cm x 0.2 cm  °Technique: Black foam to wound base, followed by adherent dressing. Set to 125 mmHg with good seal noted. °Disposition: Patient tolerated procedure well. ° °No follow-ups on file. ° ° °

## 2021-07-01 NOTE — Progress Notes (Signed)
°  Subjective:  Patient ID: [name], male    DOB: 12/27/1955,  MRN: [id number]  Chief Complaint  Patient presents with   Routine Post Op    I am doing ok on the left foot and the boot is really heavy   66 y.o. male presents for wound care. Hx confirmed with patient. Denies new issues with the foot. No pain today. Oral temp 96.9.  Objective:  Physical Exam: Left foot warm and well perfused. Wound medial arch at navicular area measures 4x4 today with exposed tendon and partial talus bone measuring 2x2. Bone healthy and tan in appearance. No purulence noted. No warmth or erythema, no ascending cellulitis. Mild serous drainage only. Mild midfoot edema, no warmth or erythema Assessment:   1. 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  Ulcer left midfoot -Wound similar in size but with some fibrosis. More exposed bone today -Will plan for debridement of the wound to promote healing, along with skin graft substitute and bone biopsy. -Patient has failed conservative therapy and wishes to proceed with surgical intervention. All risks, benefits, and alternatives discussed with patient. No guarantees given. Consent reviewed and signed by patient. -Planned procedures: Debridement left foot wound, application skin graft substitute, bone biopsy.  -Wound VAC reapplied.  Procedure: Wound VAC Application Location: left midfoot Wound Measurement: 4 cm x 4 cm x 0.2 cm  Technique: Black foam to wound base, followed by adherent dressing. Set to 125 mmHg with good seal noted. Disposition: Patient tolerated procedure well.  No follow-ups on file.

## 2021-07-02 ENCOUNTER — Encounter (HOSPITAL_COMMUNITY): Payer: Self-pay

## 2021-07-02 ENCOUNTER — Encounter (HOSPITAL_COMMUNITY)
Admission: RE | Admit: 2021-07-02 | Discharge: 2021-07-02 | Disposition: A | Discharge status: Home / Self Care | Payer: BC Managed Care – PPO | Source: Ambulatory Visit | Attending: Podiatry | Admitting: Podiatry

## 2021-07-02 ENCOUNTER — Other Ambulatory Visit: Payer: Self-pay

## 2021-07-02 ENCOUNTER — Telehealth: Payer: Self-pay | Admitting: Podiatry

## 2021-07-02 VITALS — BP 116/72 | HR 74 | Temp 98.0°F | Resp 16 | Ht 74.0 in | Wt 183.0 lb

## 2021-07-02 DIAGNOSIS — E785 Hyperlipidemia, unspecified: Secondary | ICD-10-CM | POA: Diagnosis not present

## 2021-07-02 DIAGNOSIS — T8131XA Disruption of external operation (surgical) wound, not elsewhere classified, initial encounter: Secondary | ICD-10-CM | POA: Diagnosis not present

## 2021-07-02 DIAGNOSIS — Z9181 History of falling: Secondary | ICD-10-CM | POA: Diagnosis not present

## 2021-07-02 DIAGNOSIS — M1990 Unspecified osteoarthritis, unspecified site: Secondary | ICD-10-CM | POA: Diagnosis not present

## 2021-07-02 DIAGNOSIS — E1140 Type 2 diabetes mellitus with diabetic neuropathy, unspecified: Secondary | ICD-10-CM

## 2021-07-02 DIAGNOSIS — I2510 Atherosclerotic heart disease of native coronary artery without angina pectoris: Secondary | ICD-10-CM | POA: Diagnosis not present

## 2021-07-02 DIAGNOSIS — Z7984 Long term (current) use of oral hypoglycemic drugs: Secondary | ICD-10-CM | POA: Diagnosis not present

## 2021-07-02 DIAGNOSIS — Z794 Long term (current) use of insulin: Secondary | ICD-10-CM | POA: Diagnosis not present

## 2021-07-02 DIAGNOSIS — Z01812 Encounter for preprocedural laboratory examination: Secondary | ICD-10-CM | POA: Diagnosis not present

## 2021-07-02 DIAGNOSIS — Z7982 Long term (current) use of aspirin: Secondary | ICD-10-CM | POA: Diagnosis not present

## 2021-07-02 DIAGNOSIS — I10 Essential (primary) hypertension: Secondary | ICD-10-CM | POA: Diagnosis not present

## 2021-07-02 DIAGNOSIS — D649 Anemia, unspecified: Secondary | ICD-10-CM | POA: Diagnosis not present

## 2021-07-02 DIAGNOSIS — I252 Old myocardial infarction: Secondary | ICD-10-CM | POA: Diagnosis not present

## 2021-07-02 DIAGNOSIS — L97522 Non-pressure chronic ulcer of other part of left foot with fat layer exposed: Secondary | ICD-10-CM | POA: Diagnosis not present

## 2021-07-02 DIAGNOSIS — K219 Gastro-esophageal reflux disease without esophagitis: Secondary | ICD-10-CM | POA: Diagnosis not present

## 2021-07-02 DIAGNOSIS — E11621 Type 2 diabetes mellitus with foot ulcer: Secondary | ICD-10-CM | POA: Diagnosis not present

## 2021-07-02 DIAGNOSIS — Z951 Presence of aortocoronary bypass graft: Secondary | ICD-10-CM | POA: Diagnosis not present

## 2021-07-02 LAB — BASIC METABOLIC PANEL
Anion gap: 7 (ref 5–15)
BUN: 28 mg/dL — ABNORMAL HIGH (ref 8–23)
CO2: 27 mmol/L (ref 22–32)
Calcium: 9.1 mg/dL (ref 8.9–10.3)
Chloride: 100 mmol/L (ref 98–111)
Creatinine, Ser: 0.87 mg/dL (ref 0.61–1.24)
GFR, Estimated: >60 mL/min (ref >60)
Glucose, Bld: 239 mg/dL — ABNORMAL HIGH (ref 70–99)
Potassium: 4.7 mmol/L (ref 3.5–5.1)
Sodium: 134 mmol/L — ABNORMAL LOW (ref 135–145)

## 2021-07-02 LAB — CBC
HCT: 39.3 % (ref 39.0–52.0)
Hemoglobin: 12.5 g/dL — ABNORMAL LOW (ref 13.0–17.0)
MCH: 29.4 pg (ref 26.0–34.0)
MCHC: 31.8 g/dL (ref 30.0–36.0)
MCV: 92.5 fL (ref 80.0–100.0)
Platelets: 270 10*3/uL (ref 150–400)
RBC: 4.25 MIL/uL (ref 4.22–5.81)
RDW: 13.4 % (ref 11.5–15.5)
WBC: 8.7 10*3/uL (ref 4.0–10.5)
nRBC: 0 % (ref 0.0–0.2)

## 2021-07-02 LAB — GLUCOSE, CAPILLARY: Glucose-Capillary: 191 mg/dL — ABNORMAL HIGH (ref 70–99)

## 2021-07-02 NOTE — Telephone Encounter (Signed)
Please disregard, started in error.

## 2021-07-03 DIAGNOSIS — T8131XA Disruption of external operation (surgical) wound, not elsewhere classified, initial encounter: Secondary | ICD-10-CM | POA: Diagnosis not present

## 2021-07-04 DIAGNOSIS — T8131XA Disruption of external operation (surgical) wound, not elsewhere classified, initial encounter: Secondary | ICD-10-CM | POA: Diagnosis not present

## 2021-07-05 DIAGNOSIS — T8131XA Disruption of external operation (surgical) wound, not elsewhere classified, initial encounter: Secondary | ICD-10-CM | POA: Diagnosis not present

## 2021-07-05 DIAGNOSIS — L97523 Non-pressure chronic ulcer of other part of left foot with necrosis of muscle: Secondary | ICD-10-CM | POA: Diagnosis not present

## 2021-07-05 DIAGNOSIS — Z794 Long term (current) use of insulin: Secondary | ICD-10-CM | POA: Diagnosis not present

## 2021-07-05 DIAGNOSIS — E11621 Type 2 diabetes mellitus with foot ulcer: Secondary | ICD-10-CM | POA: Diagnosis not present

## 2021-07-05 LAB — HEMOGLOBIN A1C
Hgb A1c MFr Bld: 8.5 % — ABNORMAL HIGH (ref 4.8–5.6)
Mean Plasma Glucose: 197 mg/dL

## 2021-07-06 ENCOUNTER — Ambulatory Visit (HOSPITAL_COMMUNITY): Payer: BC Managed Care – PPO | Admitting: Certified Registered Nurse Anesthetist

## 2021-07-06 ENCOUNTER — Other Ambulatory Visit: Payer: Self-pay

## 2021-07-06 ENCOUNTER — Telehealth: Payer: Self-pay | Admitting: Podiatry

## 2021-07-06 ENCOUNTER — Ambulatory Visit (HOSPITAL_COMMUNITY): Payer: BC Managed Care – PPO | Admitting: Physician Assistant

## 2021-07-06 ENCOUNTER — Encounter (HOSPITAL_COMMUNITY)
Admission: RE | Disposition: A | Discharge status: Home / Self Care | Payer: Self-pay | Source: Ambulatory Visit | Attending: Podiatry

## 2021-07-06 ENCOUNTER — Ambulatory Visit (HOSPITAL_COMMUNITY)
Admission: RE | Admit: 2021-07-06 | Discharge: 2021-07-06 | Disposition: A | Discharge status: Home / Self Care | Payer: BC Managed Care – PPO | Source: Ambulatory Visit | Attending: Podiatry | Admitting: Podiatry

## 2021-07-06 ENCOUNTER — Encounter (HOSPITAL_COMMUNITY): Payer: Self-pay | Admitting: Podiatry

## 2021-07-06 ENCOUNTER — Encounter: Payer: Self-pay | Admitting: Podiatry

## 2021-07-06 DIAGNOSIS — I2510 Atherosclerotic heart disease of native coronary artery without angina pectoris: Secondary | ICD-10-CM | POA: Diagnosis not present

## 2021-07-06 DIAGNOSIS — Z7984 Long term (current) use of oral hypoglycemic drugs: Secondary | ICD-10-CM | POA: Diagnosis not present

## 2021-07-06 DIAGNOSIS — E11621 Type 2 diabetes mellitus with foot ulcer: Secondary | ICD-10-CM | POA: Insufficient documentation

## 2021-07-06 DIAGNOSIS — L97426 Non-pressure chronic ulcer of left heel and midfoot with bone involvement without evidence of necrosis: Secondary | ICD-10-CM | POA: Diagnosis not present

## 2021-07-06 DIAGNOSIS — L97423 Non-pressure chronic ulcer of left heel and midfoot with necrosis of muscle: Secondary | ICD-10-CM | POA: Diagnosis not present

## 2021-07-06 DIAGNOSIS — M899 Disorder of bone, unspecified: Secondary | ICD-10-CM | POA: Diagnosis not present

## 2021-07-06 DIAGNOSIS — T8131XA Disruption of external operation (surgical) wound, not elsewhere classified, initial encounter: Secondary | ICD-10-CM | POA: Diagnosis not present

## 2021-07-06 HISTORY — PX: WOUND DEBRIDEMENT: SHX247

## 2021-07-06 LAB — COMPREHENSIVE METABOLIC PANEL
ALT: 19 U/L (ref 0–44)
AST: 19 U/L (ref 15–41)
Albumin: 3.1 g/dL — ABNORMAL LOW (ref 3.5–5.0)
Alkaline Phosphatase: 72 U/L (ref 38–126)
Anion gap: 6 (ref 5–15)
BUN: 33 mg/dL — ABNORMAL HIGH (ref 8–23)
CO2: 28 mmol/L (ref 22–32)
Calcium: 8.6 mg/dL — ABNORMAL LOW (ref 8.9–10.3)
Chloride: 105 mmol/L (ref 98–111)
Creatinine, Ser: 0.81 mg/dL (ref 0.61–1.24)
GFR, Estimated: >60 mL/min (ref >60)
Glucose, Bld: 83 mg/dL (ref 70–99)
Potassium: 3.9 mmol/L (ref 3.5–5.1)
Sodium: 139 mmol/L (ref 135–145)
Total Bilirubin: 0.5 mg/dL (ref 0.3–1.2)
Total Protein: 6.5 g/dL (ref 6.5–8.1)

## 2021-07-06 LAB — GLUCOSE, CAPILLARY
Glucose-Capillary: 89 mg/dL (ref 70–99)
Glucose-Capillary: 59 mg/dL — ABNORMAL LOW (ref 70–99)
Glucose-Capillary: 68 mg/dL — ABNORMAL LOW (ref 70–99)
Glucose-Capillary: 94 mg/dL (ref 70–99)

## 2021-07-06 LAB — ACID FAST CULTURE WITH REFLEXED SENSITIVITIES (MYCOBACTERIA): Acid Fast Culture: NEGATIVE

## 2021-07-06 SURGERY — DEBRIDEMENT, WOUND
Anesthesia: Monitor Anesthesia Care | Laterality: Left

## 2021-07-06 MED ORDER — OXYCODONE HCL 5 MG PO TABS: 5.0000 mg | ORAL_TABLET | Freq: Once | ORAL | Status: DC | PRN

## 2021-07-06 MED ORDER — MIDAZOLAM HCL 5 MG/5ML IJ SOLN
INTRAMUSCULAR | Status: DC | PRN
  Administered 2021-07-06: 2 mg via INTRAVENOUS

## 2021-07-06 MED ORDER — CEFAZOLIN SODIUM-DEXTROSE 2-4 GM/100ML-% IV SOLN
2.0000 g | INTRAVENOUS | Status: AC
  Administered 2021-07-06: 10:00:00 2 g via INTRAVENOUS
  Filled 2021-07-06: qty 100

## 2021-07-06 MED ORDER — BUPIVACAINE HCL (PF) 0.5 % IJ SOLN
INTRAMUSCULAR | Status: DC | PRN
  Administered 2021-07-06: 10 mL

## 2021-07-06 MED ORDER — AMISULPRIDE (ANTIEMETIC) 5 MG/2ML IV SOLN: 10.0000 mg | Freq: Once | INTRAVENOUS | Status: DC | PRN

## 2021-07-06 MED ORDER — PROPOFOL 10 MG/ML IV BOLUS
INTRAVENOUS | Status: DC | PRN
  Administered 2021-07-06: 30 mg via INTRAVENOUS

## 2021-07-06 MED ORDER — ACETAMINOPHEN 10 MG/ML IV SOLN: 1000.0000 mg | Freq: Once | INTRAVENOUS | Status: DC | PRN

## 2021-07-06 MED ORDER — LACTATED RINGERS IV SOLN: INTRAVENOUS | Status: DC

## 2021-07-06 MED ORDER — PROMETHAZINE HCL 25 MG/ML IJ SOLN: 6.2500 mg | INTRAMUSCULAR | Status: DC | PRN

## 2021-07-06 MED ORDER — CHLORHEXIDINE GLUCONATE 0.12 % MT SOLN
15.0000 mL | Freq: Once | OROMUCOSAL | Status: AC
  Administered 2021-07-06: 08:00:00 15 mL via OROMUCOSAL

## 2021-07-06 MED ORDER — OXYCODONE HCL 5 MG/5ML PO SOLN: 5.0000 mg | Freq: Once | ORAL | Status: DC | PRN

## 2021-07-06 MED ORDER — VANCOMYCIN HCL 1000 MG IV SOLR
INTRAVENOUS | Status: AC
  Filled 2021-07-06: qty 20

## 2021-07-06 MED ORDER — PROPOFOL 500 MG/50ML IV EMUL
INTRAVENOUS | Status: DC | PRN
  Administered 2021-07-06: 125 ug/kg/min via INTRAVENOUS

## 2021-07-06 MED ORDER — MIDAZOLAM HCL 2 MG/2ML IJ SOLN
INTRAMUSCULAR | Status: AC
  Filled 2021-07-06: qty 2

## 2021-07-06 MED ORDER — ORAL CARE MOUTH RINSE: 15.0000 mL | Freq: Once | OROMUCOSAL | Status: AC

## 2021-07-06 MED ORDER — KETOROLAC TROMETHAMINE 30 MG/ML IJ SOLN: 30.0000 mg | Freq: Once | INTRAMUSCULAR | Status: DC

## 2021-07-06 MED ORDER — FENTANYL CITRATE PF 50 MCG/ML IJ SOSY: 25.0000 ug | PREFILLED_SYRINGE | INTRAMUSCULAR | Status: DC | PRN

## 2021-07-06 MED ORDER — PHENYLEPHRINE 40 MCG/ML (10ML) SYRINGE FOR IV PUSH (FOR BLOOD PRESSURE SUPPORT)
PREFILLED_SYRINGE | INTRAVENOUS | Status: DC | PRN
  Administered 2021-07-06 (×2): 80 ug via INTRAVENOUS

## 2021-07-06 MED ORDER — BUPIVACAINE HCL (PF) 0.5 % IJ SOLN
INTRAMUSCULAR | Status: AC
  Filled 2021-07-06: qty 30

## 2021-07-06 SURGICAL SUPPLY — 59 items
BLADE SURG 15 STRL LF DISP TIS (BLADE) ×1 IMPLANT
BLADE SURG 15 STRL SS (BLADE) ×2
BNDG ELASTIC 3X5.8 VLCR STR LF (GAUZE/BANDAGES/DRESSINGS) ×3 IMPLANT
BNDG ELASTIC 4X5.8 VLCR STR LF (GAUZE/BANDAGES/DRESSINGS) ×3 IMPLANT
BNDG ESMARK 4X9 LF (GAUZE/BANDAGES/DRESSINGS) IMPLANT
BNDG GAUZE ELAST 4 BULKY (GAUZE/BANDAGES/DRESSINGS) ×3 IMPLANT
CHLORAPREP W/TINT 26 (MISCELLANEOUS) IMPLANT
CNTNR URN SCR LID CUP LEK RST (MISCELLANEOUS) IMPLANT
CONT SPEC 4OZ STRL OR WHT (MISCELLANEOUS)
COVER BACK TABLE 60X90IN (DRAPES) ×3 IMPLANT
CUFF TOURN SGL QUICK 18X4 (TOURNIQUET CUFF) IMPLANT
CUFF TOURN SGL QUICK 24 (TOURNIQUET CUFF)
CUFF TRNQT CYL 24X4X16.5-23 (TOURNIQUET CUFF) IMPLANT
DRAPE 3/4 80X56 (DRAPES) ×3 IMPLANT
DRAPE EXTREMITY T 121X128X90 (DISPOSABLE) ×3 IMPLANT
DRAPE SHEET LG 3/4 BI-LAMINATE (DRAPES) ×3 IMPLANT
DRAPE U-SHAPE 47X51 STRL (DRAPES) ×3 IMPLANT
DRSG MEPITEL 8X12 (GAUZE/BANDAGES/DRESSINGS) ×1 IMPLANT
DRSG VAC ATS MED SENSATRAC (GAUZE/BANDAGES/DRESSINGS) ×2 IMPLANT
DRSG VERSA FOAM LRG 10X15 (GAUZE/BANDAGES/DRESSINGS) ×2 IMPLANT
ELECT REM PT RETURN 15FT ADLT (MISCELLANEOUS) ×3 IMPLANT
GAUZE 4X4 16PLY ~~LOC~~+RFID DBL (SPONGE) ×3 IMPLANT
GAUZE SPONGE 4X4 12PLY STRL (GAUZE/BANDAGES/DRESSINGS) ×3 IMPLANT
GAUZE XEROFORM 1X8 LF (GAUZE/BANDAGES/DRESSINGS) IMPLANT
GLOVE SRG 8 PF TXTR STRL LF DI (GLOVE) ×1 IMPLANT
GLOVE SURG ENC MOIS LTX SZ8 (GLOVE) ×3 IMPLANT
GLOVE SURG UNDER POLY LF SZ8 (GLOVE) ×2
GOWN STRL REUS W/TWL 2XL LVL3 (GOWN DISPOSABLE) ×3 IMPLANT
KIT BASIN OR (CUSTOM PROCEDURE TRAY) ×3 IMPLANT
KIT TURNOVER CYSTO (KITS) ×3 IMPLANT
MANIFOLD NEPTUNE II (INSTRUMENTS) ×3 IMPLANT
MATRIX PURAPLY AM COLLAGEN 6X9 (Tissue) IMPLANT
MEPITEL 8X12 (GAUZE/BANDAGES/DRESSINGS) ×1
NDL HYPO 25X1 1.5 SAFETY (NEEDLE) ×1 IMPLANT
NEEDLE HYPO 25X1 1.5 SAFETY (NEEDLE) ×3 IMPLANT
NS IRRIG 1000ML POUR BTL (IV SOLUTION) ×2 IMPLANT
PADDING CAST ABS 4INX4YD NS (CAST SUPPLIES) ×2
PADDING CAST ABS COTTON 4X4 ST (CAST SUPPLIES) ×1 IMPLANT
PENCIL SMOKE EVACUATOR (MISCELLANEOUS) ×3 IMPLANT
PROBE DEBRIDE SHARPVAC MISONIX (TIP) ×2 IMPLANT
PURAPLY AM COLLAGEN 6X9 (Tissue) ×3 IMPLANT
SET IRRIG Y TYPE TUR BLADDER L (SET/KITS/TRAYS/PACK) IMPLANT
SPONGE T-LAP 4X18 ~~LOC~~+RFID (SPONGE) ×3 IMPLANT
STAPLER VISISTAT 35W (STAPLE) IMPLANT
STOCKINETTE 6  STRL (DRAPES) ×2
STOCKINETTE 6 STRL (DRAPES) ×1 IMPLANT
SUCTION FRAZIER HANDLE 10FR (MISCELLANEOUS)
SUCTION TUBE FRAZIER 10FR DISP (MISCELLANEOUS) IMPLANT
SUT ETHILON 3 0 PS 1 (SUTURE) IMPLANT
SUT ETHILON 4 0 PS 2 18 (SUTURE) IMPLANT
SUT MNCRL AB 3-0 PS2 18 (SUTURE) IMPLANT
SUT MNCRL AB 4-0 PS2 18 (SUTURE) IMPLANT
SUT VIC AB 2-0 SH 27 (SUTURE)
SUT VIC AB 2-0 SH 27XBRD (SUTURE) IMPLANT
SYR BULB EAR ULCER 3OZ GRN STR (SYRINGE) ×3 IMPLANT
SYR CONTROL 10ML LL (SYRINGE) ×3 IMPLANT
TUBE IRRIGATION SET MISONIX (TUBING) IMPLANT
UNDERPAD 30X36 HEAVY ABSORB (UNDERPADS AND DIAPERS) ×3 IMPLANT
YANKAUER SUCT BULB TIP NO VENT (SUCTIONS) ×3 IMPLANT

## 2021-07-06 NOTE — Interval H&P Note (Signed)
History and Physical Interval Note:  07/06/2021 9:13 AM  [name]  has presented today for surgery, with the diagnosis of ulcer left foot.  The various methods of treatment have been discussed with the patient and family. After consideration of risks, benefits and other options for treatment, the patient has consented to  Procedure(s): DEBRIDEMENT WOUND (Left) as a surgical intervention.  The patient's history has been reviewed, patient examined, no change in status, stable for surgery.  I have reviewed the patient's chart and labs.  Questions were answered to the patient's satisfaction.     Park Liter

## 2021-07-06 NOTE — Transfer of Care (Signed)
Immediate Anesthesia Transfer of Care Note  Patient: [name]  Procedure(s) Performed: DEBRIDEMENT WOUND (Left)  Patient Location: PACU  Anesthesia Type:MAC  Level of Consciousness: awake and drowsy  Airway & Oxygen Therapy: Patient Spontanous Breathing and Patient connected to face mask oxygen  Post-op Assessment: Report given to RN and Post -op Vital signs reviewed and stable  Post vital signs: Reviewed and stable  Last Vitals:  Vitals Value Taken Time  BP 94/49 07/06/21 1101  Temp    Pulse 65 07/06/21 1104  Resp 13 07/06/21 1104  SpO2 100 % 07/06/21 1104  Vitals shown include unvalidated device data.  Last Pain:  Vitals:   07/06/21 0750  TempSrc: Oral  PainSc: 0-No pain         Complications: No notable events documented.

## 2021-07-06 NOTE — Telephone Encounter (Signed)
Called wife for post-op update. 

## 2021-07-06 NOTE — Op Note (Signed)
Patient Name: TEOMAN GIRAUD DOB: 27-Nov-1955  MRN: [id number]   Date of Service: 07/06/21   Surgeon: Dr. Hardie Pulley, DPM Assistants: None Pre-operative Diagnosis: ulcer left midfoot with bone involvement without necrosis Post-operative Diagnosis: same Procedures:             1) Debridement and irrigation of left foot wound  2) Application of skin graft substitute  3) Application of wound VAC Pathology/Specimens: ID Type Source Tests Collected by Time Destination  1 : left foot talus bone Tissue PATH Bone biopsy SURGICAL PATHOLOGY Evelina Bucy, DPM 07/06/2021 1037    Anesthesia: MAC Hemostasis: Anatomic Estimated Blood Loss: 46m Materials:  Implant Name Type Inv. Item Serial No. Manufacturer Lot No. LRB No. Used Action  PURAPLY AM COLLAGEN 6X9 - [id number] PURAPLY AM COLLAGEN 6X9  [employer] [id number] Left 1 Implanted    Medications: 10 ml 0.5% marcaine plain Complications: None  Indications for Procedure:  This is a 66y.o. male with a chronic left lower extremity wound. He presents for routine debridement and irrigation to promote healing of the wound.   Procedure in Detail: Patient was identified in pre-operative holding area. Formal consent was signed and the left lower extremity was marked. Patient was brought back to the operating room and placed on the operating room table in the supine position. Anesthesia was induced.   The extremity was prepped and draped in the usual sterile fashion. Timeout was taken to confirm patient name, laterality, and procedure prior to incision. Attention was then directed to the left foot where a wound measuring 3.5*4 was encountered. There was exposed bone ecentrically in the wound.  A Jamshidi needle was used to biopsy the bone. The bone was firm and normal in color and appeared viable. Healthy bleeding of the bone was noted.  The wound was sharply excisionally debrided with a 15 blade, followed by a misonix ultrasonic  debrider. Debridement was performed to bleeding, viable wound base. The wound was debrided to and including the level of the bone. Following debridement the wound measured 4x4 with a 2x2 area of exposed bone.  A puraply graft was applied to the wound and hydrated with saline. This was followed by mepitel. A wound VAC was applied, with a white sponge over the mepitel, followed by black sponge adhered to the wound bed, followed by occlusive dressing. The trackpad was placed and set to the wound VAC machine at 125 mmHg.   The foot was then dressed with well padded kerlix and ACE bandage. Patient tolerated the procedure well.   Disposition: Following a period of post-operative monitoring, patient will be transferred back home.

## 2021-07-06 NOTE — Anesthesia Postprocedure Evaluation (Signed)
Anesthesia Post Note  Patient: Darlys Gales  Procedure(s) Performed: DEBRIDEMENT WOUND (Left)     Patient location during evaluation: PACU Anesthesia Type: MAC Level of consciousness: awake Pain management: pain level controlled Vital Signs Assessment: post-procedure vital signs reviewed and stable Respiratory status: spontaneous breathing, nonlabored ventilation, respiratory function stable and patient connected to nasal cannula oxygen Cardiovascular status: stable and blood pressure returned to baseline Postop Assessment: no apparent nausea or vomiting Anesthetic complications: no   No notable events documented.  Last Vitals:  Vitals:   07/06/21 1200 07/06/21 1215  BP:  140/70  Pulse: 83 81  Resp: 10 11  Temp:  36.7 C  SpO2: 100% 100%    Last Pain:  Vitals:   07/06/21 1215  TempSrc:   PainSc: 0-No pain                 Yandel Zeiner P Kylyn Sookram

## 2021-07-06 NOTE — Discharge Instructions (Signed)
  After Surgery Instructions   1) If you are recuperating from surgery anywhere other than home, please be sure to leave us the number where you can be reached.  2) Go directly home and rest.  3) Keep the operated foot(feet) elevated six inches above the hip when sitting or lying down. This will help control swelling and pain.  4) Support the elevated foot and leg with pillows. DO NOT PLACE PILLOWS UNDER THE KNEE.  5) DO NOT REMOVE or get your bandages WET, unless you were given different instructions by your doctor to do so. This increases the risk of infection.  6) Wear your surgical shoe or surgical boot at all times when you are up on your feet.  7) A limited amount of pain and swelling may occur. The skin may take on a bruised appearance. DO NOT BE ALARMED, THIS IS NORMAL.  8) For slight pain and swelling, apply an ice pack directly over the bandages for 15 minutes only out of each hour of the day. Continue until seen in the office for your first post op visit. DO NOT APPLY ANY FORM OF HEAT TO THE AREA.  9) Have prescriptions filled immediately and take as directed.  10) Drink lots of liquids, water and juice to stay hydrated.  11) CALL IMMEDIATELY IF:  *Bleeding continues until the following day of surgery  *Pain increases and/or does not respond to medication  *Bandages or cast appears to tight  *If your bandage gets wet  *Trip, fall or stump your surgical foot  *If your temperature goes above 101  *If you have ANY questions at all  12) You are expected to be non-weightbearing after your surgery.   If you need to reach the nurse for any reason, please call: [place]: [phone] [place]: [phone] [place]: [phone]  

## 2021-07-06 NOTE — Anesthesia Preprocedure Evaluation (Signed)
Anesthesia Evaluation  Patient identified by MRN, date of birth, ID band Patient awake    Reviewed: Allergy & Precautions, NPO status , Patient's Chart, lab work & pertinent test results  Airway Mallampati: II  TM Distance: >3 FB Neck ROM: Full    Dental no notable dental hx.    Pulmonary neg pulmonary ROS,    Pulmonary exam normal breath sounds clear to auscultation       Cardiovascular hypertension, Pt. on medications and Pt. on home beta blockers + CAD, + Past MI and + CABG  Normal cardiovascular exam Rhythm:Regular Rate:Normal     Neuro/Psych negative neurological ROS  negative psych ROS   GI/Hepatic negative GI ROS, Neg liver ROS,   Endo/Other  diabetes, Oral Hypoglycemic Agents  Renal/GU negative Renal ROS     Musculoskeletal negative musculoskeletal ROS (+)   Abdominal   Peds  Hematology negative hematology ROS (+)   Anesthesia Other Findings ulcer left foot  Reproductive/Obstetrics                             Anesthesia Physical Anesthesia Plan  ASA: 3  Anesthesia Plan: MAC   Post-op Pain Management:    Induction: Intravenous  PONV Risk Score and Plan: 2 and Ondansetron, Dexamethasone, Propofol infusion, Midazolam and Treatment may vary due to age or medical condition  Airway Management Planned: Simple Face Mask  Additional Equipment:   Intra-op Plan:   Post-operative Plan:   Informed Consent: I have reviewed the patients History and Physical, chart, labs and discussed the procedure including the risks, benefits and alternatives for the proposed anesthesia with the patient or authorized representative who has indicated his/her understanding and acceptance.     Dental advisory given  Plan Discussed with: CRNA  Anesthesia Plan Comments:         Anesthesia Quick Evaluation

## 2021-07-07 ENCOUNTER — Telehealth: Payer: Self-pay

## 2021-07-07 ENCOUNTER — Encounter (HOSPITAL_COMMUNITY): Payer: Self-pay | Admitting: Podiatry

## 2021-07-07 DIAGNOSIS — S91302A Unspecified open wound, left foot, initial encounter: Secondary | ICD-10-CM | POA: Diagnosis not present

## 2021-07-07 DIAGNOSIS — T8131XA Disruption of external operation (surgical) wound, not elsewhere classified, initial encounter: Secondary | ICD-10-CM | POA: Diagnosis not present

## 2021-07-07 LAB — SURGICAL PATHOLOGY

## 2021-07-07 NOTE — Telephone Encounter (Signed)
Pt is calling very upset and needs wound care order from his Home Care Nurse from treatment tomorrow. Patient never received instruction yesterday. Please advise asap. [name] [phone] is the nurse that can be reached with those order. The patient would also like an update on these orders as well.

## 2021-07-07 NOTE — Telephone Encounter (Signed)
LVM for the patient and let him know what per Dr. Ardelle Anton lead provider since the skin graft was applied, the dressing change will take place at the first appointment on Monday 07/12/21 at 230pm. I also updated the wound care nurse as well Georges Lynch as well. If Dr. Samuella Cota would like something different order will be placed and everyone will be updated.

## 2021-07-08 ENCOUNTER — Telehealth: Payer: Self-pay | Admitting: *Deleted

## 2021-07-08 ENCOUNTER — Encounter: Payer: BC Managed Care – PPO | Admitting: Podiatry

## 2021-07-08 ENCOUNTER — Telehealth: Payer: Self-pay

## 2021-07-08 DIAGNOSIS — Z794 Long term (current) use of insulin: Secondary | ICD-10-CM | POA: Diagnosis not present

## 2021-07-08 DIAGNOSIS — Z951 Presence of aortocoronary bypass graft: Secondary | ICD-10-CM | POA: Diagnosis not present

## 2021-07-08 DIAGNOSIS — K219 Gastro-esophageal reflux disease without esophagitis: Secondary | ICD-10-CM | POA: Diagnosis not present

## 2021-07-08 DIAGNOSIS — I252 Old myocardial infarction: Secondary | ICD-10-CM | POA: Diagnosis not present

## 2021-07-08 DIAGNOSIS — Z7982 Long term (current) use of aspirin: Secondary | ICD-10-CM | POA: Diagnosis not present

## 2021-07-08 DIAGNOSIS — Z7984 Long term (current) use of oral hypoglycemic drugs: Secondary | ICD-10-CM | POA: Diagnosis not present

## 2021-07-08 DIAGNOSIS — T8131XA Disruption of external operation (surgical) wound, not elsewhere classified, initial encounter: Secondary | ICD-10-CM | POA: Diagnosis not present

## 2021-07-08 DIAGNOSIS — E1140 Type 2 diabetes mellitus with diabetic neuropathy, unspecified: Secondary | ICD-10-CM | POA: Diagnosis not present

## 2021-07-08 DIAGNOSIS — E11621 Type 2 diabetes mellitus with foot ulcer: Secondary | ICD-10-CM | POA: Diagnosis not present

## 2021-07-08 DIAGNOSIS — L97522 Non-pressure chronic ulcer of other part of left foot with fat layer exposed: Secondary | ICD-10-CM | POA: Diagnosis not present

## 2021-07-08 DIAGNOSIS — M1990 Unspecified osteoarthritis, unspecified site: Secondary | ICD-10-CM | POA: Diagnosis not present

## 2021-07-08 DIAGNOSIS — E785 Hyperlipidemia, unspecified: Secondary | ICD-10-CM | POA: Diagnosis not present

## 2021-07-08 DIAGNOSIS — I10 Essential (primary) hypertension: Secondary | ICD-10-CM | POA: Diagnosis not present

## 2021-07-08 DIAGNOSIS — Z9181 History of falling: Secondary | ICD-10-CM | POA: Diagnosis not present

## 2021-07-08 DIAGNOSIS — D649 Anemia, unspecified: Secondary | ICD-10-CM | POA: Diagnosis not present

## 2021-07-08 DIAGNOSIS — I2510 Atherosclerotic heart disease of native coronary artery without angina pectoris: Secondary | ICD-10-CM | POA: Diagnosis not present

## 2021-07-08 NOTE — Telephone Encounter (Signed)
Called and spoke with Allie from [place] and stated to change the wound vac due to the canister is stating blockage and the pressure is showing zero and per Dr Samuella Cota relayed to change the wound vac and start over and to call the office if any concerns or questions. Misty Stanley

## 2021-07-08 NOTE — Telephone Encounter (Signed)
-----   Message from Park Liter, DPM sent at 07/08/2021  8:11 AM EST ----- Please continue wound VAC therapy twice weekly, with him coming into the office with me one other time to fill the gap.  Non-adherent layer to cover his graft, followed by wound VAC.  Please call ASAP and inform patient we have provided orders   ----- Message ----- From: Lanney Gins, [facility] Sent: 07/07/2021   2:51 PM EST To: Park Liter, DPM  Allie from [place] called and needed updated orders and how often to change it and the number is [phone]. Misty Stanley

## 2021-07-08 NOTE — Telephone Encounter (Signed)
Noted  

## 2021-07-08 NOTE — Telephone Encounter (Signed)
Called and spoke with the nurse Gerarda Gunther) today and relayed the message to the nurse and gave verbal orders. Misty Stanley

## 2021-07-08 NOTE — Telephone Encounter (Signed)
Patient called the office stating that his wound vac is alarming and stating " blockage".  The pressure is also zero the nurse wants to know if she need to go ahead a change it.   Please advise ...   Call back [name] - [phone]

## 2021-07-08 NOTE — Telephone Encounter (Signed)
I tried calling the nurse twice this AM and could not get her. I have instructed Lattie Haw to call her again ASAP.   As for his instructions - he got his regular post-op instructions (I confirmed that they were in his AVS from the hospital). I called his wife post-operatively (and documented as such). I am not sure what instructions he is referring to. He has had a VAC for over 2 months and nothing here is new.  I did not get any message regarding updated orders until 2:51 PM yesterday Lattie Haw via Staff Message), which I did not see until this AM as I was not in the office and this was not routed to any other provider.   Barbaraann Rondo can you please update the patient that we are working on it?  Worst case if his nurse cannot come today I would be happy to change his VAC at the conclusion of clinic today.

## 2021-07-09 DIAGNOSIS — T8131XA Disruption of external operation (surgical) wound, not elsewhere classified, initial encounter: Secondary | ICD-10-CM | POA: Diagnosis not present

## 2021-07-10 DIAGNOSIS — T8131XA Disruption of external operation (surgical) wound, not elsewhere classified, initial encounter: Secondary | ICD-10-CM | POA: Diagnosis not present

## 2021-07-11 DIAGNOSIS — T8131XA Disruption of external operation (surgical) wound, not elsewhere classified, initial encounter: Secondary | ICD-10-CM | POA: Diagnosis not present

## 2021-07-12 ENCOUNTER — Encounter: Payer: Self-pay | Admitting: Podiatry

## 2021-07-12 ENCOUNTER — Ambulatory Visit (INDEPENDENT_AMBULATORY_CARE_PROVIDER_SITE_OTHER): Payer: BC Managed Care – PPO | Admitting: Podiatry

## 2021-07-12 VITALS — Temp 97.1°F

## 2021-07-12 DIAGNOSIS — E11621 Type 2 diabetes mellitus with foot ulcer: Secondary | ICD-10-CM

## 2021-07-12 DIAGNOSIS — L97423 Non-pressure chronic ulcer of left heel and midfoot with necrosis of muscle: Secondary | ICD-10-CM

## 2021-07-12 DIAGNOSIS — E08621 Diabetes mellitus due to underlying condition with foot ulcer: Secondary | ICD-10-CM | POA: Diagnosis not present

## 2021-07-12 DIAGNOSIS — M14672 Charcot's joint, left ankle and foot: Secondary | ICD-10-CM

## 2021-07-12 DIAGNOSIS — T8131XA Disruption of external operation (surgical) wound, not elsewhere classified, initial encounter: Secondary | ICD-10-CM | POA: Diagnosis not present

## 2021-07-12 DIAGNOSIS — L97403 Non-pressure chronic ulcer of unspecified heel and midfoot with necrosis of muscle: Secondary | ICD-10-CM

## 2021-07-13 DIAGNOSIS — T8131XA Disruption of external operation (surgical) wound, not elsewhere classified, initial encounter: Secondary | ICD-10-CM | POA: Diagnosis not present

## 2021-07-14 ENCOUNTER — Ambulatory Visit: Payer: Self-pay | Admitting: Podiatry

## 2021-07-14 DIAGNOSIS — I10 Essential (primary) hypertension: Secondary | ICD-10-CM | POA: Diagnosis not present

## 2021-07-14 DIAGNOSIS — D649 Anemia, unspecified: Secondary | ICD-10-CM | POA: Diagnosis not present

## 2021-07-14 DIAGNOSIS — T8131XA Disruption of external operation (surgical) wound, not elsewhere classified, initial encounter: Secondary | ICD-10-CM | POA: Diagnosis not present

## 2021-07-14 DIAGNOSIS — K219 Gastro-esophageal reflux disease without esophagitis: Secondary | ICD-10-CM | POA: Diagnosis not present

## 2021-07-14 DIAGNOSIS — I2510 Atherosclerotic heart disease of native coronary artery without angina pectoris: Secondary | ICD-10-CM | POA: Diagnosis not present

## 2021-07-14 DIAGNOSIS — Z794 Long term (current) use of insulin: Secondary | ICD-10-CM | POA: Diagnosis not present

## 2021-07-14 DIAGNOSIS — E785 Hyperlipidemia, unspecified: Secondary | ICD-10-CM | POA: Diagnosis not present

## 2021-07-14 DIAGNOSIS — Z951 Presence of aortocoronary bypass graft: Secondary | ICD-10-CM | POA: Diagnosis not present

## 2021-07-14 DIAGNOSIS — E11621 Type 2 diabetes mellitus with foot ulcer: Secondary | ICD-10-CM | POA: Diagnosis not present

## 2021-07-14 DIAGNOSIS — Z9181 History of falling: Secondary | ICD-10-CM | POA: Diagnosis not present

## 2021-07-14 DIAGNOSIS — Z7984 Long term (current) use of oral hypoglycemic drugs: Secondary | ICD-10-CM | POA: Diagnosis not present

## 2021-07-14 DIAGNOSIS — L97522 Non-pressure chronic ulcer of other part of left foot with fat layer exposed: Secondary | ICD-10-CM | POA: Diagnosis not present

## 2021-07-14 DIAGNOSIS — E1140 Type 2 diabetes mellitus with diabetic neuropathy, unspecified: Secondary | ICD-10-CM | POA: Diagnosis not present

## 2021-07-14 DIAGNOSIS — M1990 Unspecified osteoarthritis, unspecified site: Secondary | ICD-10-CM | POA: Diagnosis not present

## 2021-07-14 DIAGNOSIS — I252 Old myocardial infarction: Secondary | ICD-10-CM | POA: Diagnosis not present

## 2021-07-14 DIAGNOSIS — Z7982 Long term (current) use of aspirin: Secondary | ICD-10-CM | POA: Diagnosis not present

## 2021-07-15 DIAGNOSIS — T8131XA Disruption of external operation (surgical) wound, not elsewhere classified, initial encounter: Secondary | ICD-10-CM | POA: Diagnosis not present

## 2021-07-15 NOTE — Progress Notes (Signed)
°  Subjective:  Patient ID: [name], male    DOB: May 28, 1956,  MRN: [id number]  Chief Complaint  Patient presents with   Nail Problem    I need to get the toe nails trimmed    Routine Post Op    I am doing ok on the left foot    66 y.o. male presents for wound care. Hx confirmed with patient. Denies new issues with the foot. Nurse has been changing the [place]. Objective:  Physical Exam: Left foot warm and well perfused. Wound medial arch at navicular area measures 4x4 today with exposed tendon and partial talus bone measuring 1*0.5. Bone healthy and tan in appearance. No purulence noted. No warmth or erythema, no ascending cellulitis. Mild serous drainage only. Mild midfoot edema, no warmth or erythema Assessment:   1. Diabetic ulcer of left midfoot associated with type 2 diabetes mellitus, with necrosis of muscle ([place])   2. Diabetic ulcer of midfoot associated with diabetes mellitus due to underlying condition, with necrosis of muscle, unspecified laterality ([place])   3. Charcot's joint of left foot    Plan:  Patient was evaluated and treated and all questions answered.  Ulcer left midfoot -Wound improving, much more bone coverage than previous. Would benefit from repeat debridement and graft application. Consent form reviewed and signed.  -Reviewed previous biopsy result, no Osteomyelitis noted. -Planned procedures: Debridement left foot wound, application skin graft substitute, bone biopsy.  -Wound VAC reapplied.  Procedure: Wound VAC Application Location: left midfoot Wound Measurement: 4 cm x 4 cm x 0.2 cm  Technique: Black foam to wound base, followed by adherent dressing. Set to 125 mmHg with good seal noted. Disposition: Patient tolerated procedure well.  No follow-ups on file.

## 2021-07-16 DIAGNOSIS — E1140 Type 2 diabetes mellitus with diabetic neuropathy, unspecified: Secondary | ICD-10-CM | POA: Diagnosis not present

## 2021-07-16 DIAGNOSIS — I10 Essential (primary) hypertension: Secondary | ICD-10-CM | POA: Diagnosis not present

## 2021-07-16 DIAGNOSIS — I2510 Atherosclerotic heart disease of native coronary artery without angina pectoris: Secondary | ICD-10-CM | POA: Diagnosis not present

## 2021-07-16 DIAGNOSIS — K219 Gastro-esophageal reflux disease without esophagitis: Secondary | ICD-10-CM | POA: Diagnosis not present

## 2021-07-16 DIAGNOSIS — Z7982 Long term (current) use of aspirin: Secondary | ICD-10-CM | POA: Diagnosis not present

## 2021-07-16 DIAGNOSIS — Z9181 History of falling: Secondary | ICD-10-CM | POA: Diagnosis not present

## 2021-07-16 DIAGNOSIS — M1990 Unspecified osteoarthritis, unspecified site: Secondary | ICD-10-CM | POA: Diagnosis not present

## 2021-07-16 DIAGNOSIS — Z951 Presence of aortocoronary bypass graft: Secondary | ICD-10-CM | POA: Diagnosis not present

## 2021-07-16 DIAGNOSIS — D649 Anemia, unspecified: Secondary | ICD-10-CM | POA: Diagnosis not present

## 2021-07-16 DIAGNOSIS — I252 Old myocardial infarction: Secondary | ICD-10-CM | POA: Diagnosis not present

## 2021-07-16 DIAGNOSIS — T8131XA Disruption of external operation (surgical) wound, not elsewhere classified, initial encounter: Secondary | ICD-10-CM | POA: Diagnosis not present

## 2021-07-16 DIAGNOSIS — L97522 Non-pressure chronic ulcer of other part of left foot with fat layer exposed: Secondary | ICD-10-CM | POA: Diagnosis not present

## 2021-07-16 DIAGNOSIS — E785 Hyperlipidemia, unspecified: Secondary | ICD-10-CM | POA: Diagnosis not present

## 2021-07-16 DIAGNOSIS — E11621 Type 2 diabetes mellitus with foot ulcer: Secondary | ICD-10-CM | POA: Diagnosis not present

## 2021-07-16 DIAGNOSIS — Z794 Long term (current) use of insulin: Secondary | ICD-10-CM | POA: Diagnosis not present

## 2021-07-16 DIAGNOSIS — Z7984 Long term (current) use of oral hypoglycemic drugs: Secondary | ICD-10-CM | POA: Diagnosis not present

## 2021-07-17 DIAGNOSIS — T8131XA Disruption of external operation (surgical) wound, not elsewhere classified, initial encounter: Secondary | ICD-10-CM | POA: Diagnosis not present

## 2021-07-18 DIAGNOSIS — T8131XA Disruption of external operation (surgical) wound, not elsewhere classified, initial encounter: Secondary | ICD-10-CM | POA: Diagnosis not present

## 2021-07-19 ENCOUNTER — Encounter: Payer: Self-pay | Admitting: Podiatry

## 2021-07-19 ENCOUNTER — Ambulatory Visit (INDEPENDENT_AMBULATORY_CARE_PROVIDER_SITE_OTHER): Payer: BC Managed Care – PPO | Admitting: Podiatry

## 2021-07-19 DIAGNOSIS — E11621 Type 2 diabetes mellitus with foot ulcer: Secondary | ICD-10-CM

## 2021-07-19 DIAGNOSIS — T8131XA Disruption of external operation (surgical) wound, not elsewhere classified, initial encounter: Secondary | ICD-10-CM | POA: Diagnosis not present

## 2021-07-19 DIAGNOSIS — L97423 Non-pressure chronic ulcer of left heel and midfoot with necrosis of muscle: Secondary | ICD-10-CM | POA: Diagnosis not present

## 2021-07-19 DIAGNOSIS — L97403 Non-pressure chronic ulcer of unspecified heel and midfoot with necrosis of muscle: Secondary | ICD-10-CM

## 2021-07-19 DIAGNOSIS — M14672 Charcot's joint, left ankle and foot: Secondary | ICD-10-CM

## 2021-07-19 DIAGNOSIS — E08621 Diabetes mellitus due to underlying condition with foot ulcer: Secondary | ICD-10-CM

## 2021-07-19 NOTE — Progress Notes (Signed)
°  Subjective:  Patient ID: [name], male    DOB: 1956/04/28,  MRN: [id number]  Chief Complaint  Patient presents with   Routine Post Op    I am doing ok on the left foot    66 y.o. male presents for wound care. Hx confirmed with patient. Denies new issues with the foot. Nurse has been changing the [place]. Objective:  Physical Exam: Left foot warm and well perfused. Wound medial arch at navicular area measures 4x4 today with exposed partial talus bone measuring 2x1. Bone healthy and tan in appearance. No purulence noted. No warmth or erythema, no ascending cellulitis. Mild serous drainage only. Mild midfoot edema, no warmth or erythema Assessment:   1. Diabetic ulcer of left midfoot associated with type 2 diabetes mellitus, with necrosis of muscle ([place])   2. Diabetic ulcer of midfoot associated with diabetes mellitus due to underlying condition, with necrosis of muscle, unspecified laterality ([place])   3. Charcot's joint of left foot     Plan:  Patient was evaluated and treated and all questions answered.  Ulcer left midfoot -Wound about the same but with less bone coverage today. Hopefully the graft will help improve this. Vac reapplied today. -2/17 Planned procedures: Debridement left foot wound, application skin graft substitute, bone biopsy.  -Wound VAC reapplied.  Procedure: Wound VAC Application Location: left midfoot Wound Measurement: 4 cm x 4 cm x 0.2 cm  Technique: Black foam to wound base, followed by adherent dressing. Set to 125 mmHg with good seal noted. Disposition: Patient tolerated procedure well.   No follow-ups on file.

## 2021-07-20 ENCOUNTER — Encounter (HOSPITAL_COMMUNITY): Payer: Self-pay | Admitting: Podiatry

## 2021-07-20 ENCOUNTER — Other Ambulatory Visit: Payer: Self-pay

## 2021-07-20 DIAGNOSIS — T8131XA Disruption of external operation (surgical) wound, not elsewhere classified, initial encounter: Secondary | ICD-10-CM | POA: Diagnosis not present

## 2021-07-20 NOTE — Progress Notes (Signed)
Attempted to obtain medical history via telephone, unable to reach at this time. I left a voicemail to return pre surgical testing department's phone call.  

## 2021-07-21 DIAGNOSIS — I10 Essential (primary) hypertension: Secondary | ICD-10-CM | POA: Diagnosis not present

## 2021-07-21 DIAGNOSIS — Z951 Presence of aortocoronary bypass graft: Secondary | ICD-10-CM | POA: Diagnosis not present

## 2021-07-21 DIAGNOSIS — I2510 Atherosclerotic heart disease of native coronary artery without angina pectoris: Secondary | ICD-10-CM | POA: Diagnosis not present

## 2021-07-21 DIAGNOSIS — Z7982 Long term (current) use of aspirin: Secondary | ICD-10-CM | POA: Diagnosis not present

## 2021-07-21 DIAGNOSIS — M1990 Unspecified osteoarthritis, unspecified site: Secondary | ICD-10-CM | POA: Diagnosis not present

## 2021-07-21 DIAGNOSIS — Z7984 Long term (current) use of oral hypoglycemic drugs: Secondary | ICD-10-CM | POA: Diagnosis not present

## 2021-07-21 DIAGNOSIS — I252 Old myocardial infarction: Secondary | ICD-10-CM | POA: Diagnosis not present

## 2021-07-21 DIAGNOSIS — L97522 Non-pressure chronic ulcer of other part of left foot with fat layer exposed: Secondary | ICD-10-CM | POA: Diagnosis not present

## 2021-07-21 DIAGNOSIS — Z9181 History of falling: Secondary | ICD-10-CM | POA: Diagnosis not present

## 2021-07-21 DIAGNOSIS — T8131XA Disruption of external operation (surgical) wound, not elsewhere classified, initial encounter: Secondary | ICD-10-CM | POA: Diagnosis not present

## 2021-07-21 DIAGNOSIS — D649 Anemia, unspecified: Secondary | ICD-10-CM | POA: Diagnosis not present

## 2021-07-21 DIAGNOSIS — E1140 Type 2 diabetes mellitus with diabetic neuropathy, unspecified: Secondary | ICD-10-CM | POA: Diagnosis not present

## 2021-07-21 DIAGNOSIS — E11621 Type 2 diabetes mellitus with foot ulcer: Secondary | ICD-10-CM | POA: Diagnosis not present

## 2021-07-21 DIAGNOSIS — Z794 Long term (current) use of insulin: Secondary | ICD-10-CM | POA: Diagnosis not present

## 2021-07-21 DIAGNOSIS — E785 Hyperlipidemia, unspecified: Secondary | ICD-10-CM | POA: Diagnosis not present

## 2021-07-21 DIAGNOSIS — K219 Gastro-esophageal reflux disease without esophagitis: Secondary | ICD-10-CM | POA: Diagnosis not present

## 2021-07-22 DIAGNOSIS — T8131XA Disruption of external operation (surgical) wound, not elsewhere classified, initial encounter: Secondary | ICD-10-CM | POA: Diagnosis not present

## 2021-07-23 DIAGNOSIS — Z794 Long term (current) use of insulin: Secondary | ICD-10-CM | POA: Diagnosis not present

## 2021-07-23 DIAGNOSIS — E11621 Type 2 diabetes mellitus with foot ulcer: Secondary | ICD-10-CM | POA: Diagnosis not present

## 2021-07-23 DIAGNOSIS — Z7984 Long term (current) use of oral hypoglycemic drugs: Secondary | ICD-10-CM | POA: Diagnosis not present

## 2021-07-23 DIAGNOSIS — E1140 Type 2 diabetes mellitus with diabetic neuropathy, unspecified: Secondary | ICD-10-CM | POA: Diagnosis not present

## 2021-07-23 DIAGNOSIS — L97522 Non-pressure chronic ulcer of other part of left foot with fat layer exposed: Secondary | ICD-10-CM | POA: Diagnosis not present

## 2021-07-23 DIAGNOSIS — E11610 Type 2 diabetes mellitus with diabetic neuropathic arthropathy: Secondary | ICD-10-CM | POA: Diagnosis not present

## 2021-07-23 DIAGNOSIS — K219 Gastro-esophageal reflux disease without esophagitis: Secondary | ICD-10-CM | POA: Diagnosis not present

## 2021-07-23 DIAGNOSIS — Z951 Presence of aortocoronary bypass graft: Secondary | ICD-10-CM | POA: Diagnosis not present

## 2021-07-23 DIAGNOSIS — I252 Old myocardial infarction: Secondary | ICD-10-CM | POA: Diagnosis not present

## 2021-07-23 DIAGNOSIS — I2510 Atherosclerotic heart disease of native coronary artery without angina pectoris: Secondary | ICD-10-CM | POA: Diagnosis not present

## 2021-07-23 DIAGNOSIS — I10 Essential (primary) hypertension: Secondary | ICD-10-CM | POA: Diagnosis not present

## 2021-07-23 DIAGNOSIS — Z7982 Long term (current) use of aspirin: Secondary | ICD-10-CM | POA: Diagnosis not present

## 2021-07-23 DIAGNOSIS — E785 Hyperlipidemia, unspecified: Secondary | ICD-10-CM | POA: Diagnosis not present

## 2021-07-23 DIAGNOSIS — D649 Anemia, unspecified: Secondary | ICD-10-CM | POA: Diagnosis not present

## 2021-07-23 DIAGNOSIS — M1990 Unspecified osteoarthritis, unspecified site: Secondary | ICD-10-CM | POA: Diagnosis not present

## 2021-07-23 DIAGNOSIS — Z9181 History of falling: Secondary | ICD-10-CM | POA: Diagnosis not present

## 2021-07-23 DIAGNOSIS — T8131XA Disruption of external operation (surgical) wound, not elsewhere classified, initial encounter: Secondary | ICD-10-CM | POA: Diagnosis not present

## 2021-07-24 DIAGNOSIS — T8131XA Disruption of external operation (surgical) wound, not elsewhere classified, initial encounter: Secondary | ICD-10-CM | POA: Diagnosis not present

## 2021-07-25 DIAGNOSIS — T8131XA Disruption of external operation (surgical) wound, not elsewhere classified, initial encounter: Secondary | ICD-10-CM | POA: Diagnosis not present

## 2021-07-26 ENCOUNTER — Ambulatory Visit (INDEPENDENT_AMBULATORY_CARE_PROVIDER_SITE_OTHER): Payer: BC Managed Care – PPO | Admitting: Podiatry

## 2021-07-26 ENCOUNTER — Telehealth: Payer: Self-pay | Admitting: *Deleted

## 2021-07-26 ENCOUNTER — Telehealth: Payer: Self-pay | Admitting: Urology

## 2021-07-26 ENCOUNTER — Encounter: Payer: Self-pay | Admitting: Podiatry

## 2021-07-26 ENCOUNTER — Encounter: Payer: BC Managed Care – PPO | Admitting: Podiatry

## 2021-07-26 DIAGNOSIS — L97403 Non-pressure chronic ulcer of unspecified heel and midfoot with necrosis of muscle: Secondary | ICD-10-CM

## 2021-07-26 DIAGNOSIS — L97423 Non-pressure chronic ulcer of left heel and midfoot with necrosis of muscle: Secondary | ICD-10-CM | POA: Diagnosis not present

## 2021-07-26 DIAGNOSIS — T8131XA Disruption of external operation (surgical) wound, not elsewhere classified, initial encounter: Secondary | ICD-10-CM | POA: Diagnosis not present

## 2021-07-26 DIAGNOSIS — E11621 Type 2 diabetes mellitus with foot ulcer: Secondary | ICD-10-CM

## 2021-07-26 DIAGNOSIS — E08621 Diabetes mellitus due to underlying condition with foot ulcer: Secondary | ICD-10-CM

## 2021-07-26 NOTE — Telephone Encounter (Signed)
[name] is calling for an outstanding order [id number] )for patient,almost out of medicare compliance. Please complete, sign and return.

## 2021-07-26 NOTE — Telephone Encounter (Signed)
Can we have them re-send? Last I checked I did not see anything outstanding but I will check tomorrow

## 2021-07-26 NOTE — Telephone Encounter (Signed)
DOS - 07/02/21   WOUND DEBRIDEMENT LT --- 81275 SKIN GRAFT --- 15275 BONE BIOPSY ---  20240     [employer] EFFECTIVE DATE - 06/14/15  RECEIVED A CALL AND FAX FROM JAY WITH [employer] STATING THAT CPT CODES 17001, 15275 AND 20240 HAS BEEN APPROVED, AUTH # [id number], GOOD FROM 07/14/21 - 10/25/21.

## 2021-07-26 NOTE — H&P (View-Only) (Signed)
°  Subjective:  Patient ID: [name], male    DOB: 1956/04/14,  MRN: [id number]  Chief Complaint  Patient presents with   Routine Post Op    I am having surgery on the 15th of this month and I am doing ok on the left foot and I still have the wound vac on    66 y.o. male presents for wound care. Hx confirmed with patient.  Noticed the VAC settings appear to be different than previous and at times reading 0 pressure. Objective:  Physical Exam: Left foot warm and well perfused. Wound medial arch at navicular area measures 4x4 today with exposed partial talus bone measuring 2x1. Bone healthy and tan in appearance. No purulence noted. No warmth or erythema, no ascending cellulitis. Mild serous drainage only. Mild midfoot edema, no warmth or erythema Assessment:   1. Diabetic ulcer of left midfoot associated with type 2 diabetes mellitus, with necrosis of muscle (HCC)   2. Diabetic ulcer of midfoot associated with diabetes mellitus due to underlying condition, with necrosis of muscle, unspecified laterality (H[facility])     Plan:  Patient was evaluated and treated and all questions answered.  Ulcer left midfoot -Wound about the same as previous. -2/15 Planned procedures: Debridement left foot wound, application skin graft substitute, bone biopsy.  -Wound VAC reapplied. -I was unable to engage clinician mode to change the settings from intermittent to continuous. Will ask rep and see if we can change this Wednesday.  Procedure: Wound VAC Application Location: left midfoot Wound Measurement: 4 cm x 4 cm x 0.3 cm  Technique: Black foam to wound base, followed by adherent dressing. Set to 125 mmHg with good seal noted. Disposition: Patient tolerated procedure well.      No follow-ups on file.

## 2021-07-26 NOTE — Progress Notes (Signed)
°  Subjective:  °Patient ID: [name], male    DOB: 11/06/1955,  MRN: [id number] ° °Chief Complaint  °Patient presents with  ° Routine Post Op  °  I am having surgery on the 15th of this month and I am doing ok on the left foot and I still have the wound vac on   ° °66 y.o. male presents for wound care. Hx confirmed with patient.  Noticed the VAC settings appear to be different than previous and at times reading 0 pressure. °Objective:  °Physical Exam: °Left foot warm and well perfused. Wound medial arch at navicular area measures 4x4 today with exposed partial talus bone measuring 2x1. Bone healthy and tan in appearance. No purulence noted. No warmth or erythema, no ascending cellulitis. Mild serous drainage only. °Mild midfoot edema, no warmth or erythema °Assessment:  ° °1. Diabetic ulcer of left midfoot associated with type 2 diabetes mellitus, with necrosis of muscle (HCC)   °2. Diabetic ulcer of midfoot associated with diabetes mellitus due to underlying condition, with necrosis of muscle, unspecified laterality (HCC)   ° ° °Plan:  °Patient was evaluated and treated and all questions answered. ° °Ulcer left midfoot °-Wound about the same as previous. °-2/15 Planned procedures: Debridement left foot wound, application skin graft substitute, bone biopsy.  °-Wound VAC reapplied. °-I was unable to engage clinician mode to change the settings from intermittent to continuous. Will ask rep and see if we can change this Wednesday. ° °Procedure: Wound VAC Application °Location: left midfoot °Wound Measurement: 4 cm x 4 cm x 0.3 cm  °Technique: Black foam to wound base, followed by adherent dressing. Set to 125 mmHg with good seal noted. °Disposition: Patient tolerated procedure well. ° ° ° ° ° °No follow-ups on file. ° ° °

## 2021-07-27 DIAGNOSIS — T8131XA Disruption of external operation (surgical) wound, not elsewhere classified, initial encounter: Secondary | ICD-10-CM | POA: Diagnosis not present

## 2021-07-27 NOTE — Telephone Encounter (Signed)
Faxed  order today to [facility]-07/27/21

## 2021-07-28 ENCOUNTER — Other Ambulatory Visit: Payer: Self-pay | Admitting: Podiatry

## 2021-07-28 ENCOUNTER — Ambulatory Visit (HOSPITAL_COMMUNITY): Payer: BC Managed Care – PPO | Admitting: Anesthesiology

## 2021-07-28 ENCOUNTER — Encounter (HOSPITAL_COMMUNITY)
Admission: RE | Disposition: A | Discharge status: Home / Self Care | Payer: Self-pay | Source: Home / Self Care | Attending: Podiatry

## 2021-07-28 ENCOUNTER — Encounter (HOSPITAL_COMMUNITY): Payer: Self-pay | Admitting: Podiatry

## 2021-07-28 ENCOUNTER — Other Ambulatory Visit: Payer: Self-pay

## 2021-07-28 ENCOUNTER — Ambulatory Visit (HOSPITAL_COMMUNITY)
Admission: RE | Admit: 2021-07-28 | Discharge: 2021-07-28 | Disposition: A | Discharge status: Home / Self Care | Payer: BC Managed Care – PPO | Attending: Podiatry | Admitting: Podiatry

## 2021-07-28 DIAGNOSIS — L97423 Non-pressure chronic ulcer of left heel and midfoot with necrosis of muscle: Secondary | ICD-10-CM

## 2021-07-28 DIAGNOSIS — I252 Old myocardial infarction: Secondary | ICD-10-CM | POA: Diagnosis not present

## 2021-07-28 DIAGNOSIS — L97529 Non-pressure chronic ulcer of other part of left foot with unspecified severity: Secondary | ICD-10-CM | POA: Diagnosis not present

## 2021-07-28 DIAGNOSIS — Z79899 Other long term (current) drug therapy: Secondary | ICD-10-CM | POA: Diagnosis not present

## 2021-07-28 DIAGNOSIS — E11621 Type 2 diabetes mellitus with foot ulcer: Secondary | ICD-10-CM

## 2021-07-28 DIAGNOSIS — Z951 Presence of aortocoronary bypass graft: Secondary | ICD-10-CM | POA: Insufficient documentation

## 2021-07-28 DIAGNOSIS — I10 Essential (primary) hypertension: Secondary | ICD-10-CM | POA: Diagnosis not present

## 2021-07-28 DIAGNOSIS — Z7984 Long term (current) use of oral hypoglycemic drugs: Secondary | ICD-10-CM | POA: Insufficient documentation

## 2021-07-28 DIAGNOSIS — I2510 Atherosclerotic heart disease of native coronary artery without angina pectoris: Secondary | ICD-10-CM | POA: Diagnosis not present

## 2021-07-28 DIAGNOSIS — E1140 Type 2 diabetes mellitus with diabetic neuropathy, unspecified: Secondary | ICD-10-CM

## 2021-07-28 DIAGNOSIS — L97526 Non-pressure chronic ulcer of other part of left foot with bone involvement without evidence of necrosis: Secondary | ICD-10-CM | POA: Insufficient documentation

## 2021-07-28 DIAGNOSIS — T8131XA Disruption of external operation (surgical) wound, not elsewhere classified, initial encounter: Secondary | ICD-10-CM | POA: Diagnosis not present

## 2021-07-28 HISTORY — PX: WOUND DEBRIDEMENT: SHX247

## 2021-07-28 HISTORY — PX: APPLICATION OF WOUND VAC: SHX5189

## 2021-07-28 LAB — CBC
HCT: 39.3 % (ref 39.0–52.0)
Hemoglobin: 12.2 g/dL — ABNORMAL LOW (ref 13.0–17.0)
MCH: 29.2 pg (ref 26.0–34.0)
MCHC: 31 g/dL (ref 30.0–36.0)
MCV: 94 fL (ref 80.0–100.0)
Platelets: 230 10*3/uL (ref 150–400)
RBC: 4.18 MIL/uL — ABNORMAL LOW (ref 4.22–5.81)
RDW: 13.4 % (ref 11.5–15.5)
WBC: 7.6 10*3/uL (ref 4.0–10.5)
nRBC: 0 % (ref 0.0–0.2)

## 2021-07-28 LAB — BASIC METABOLIC PANEL
Anion gap: 4 — ABNORMAL LOW (ref 5–15)
BUN: 28 mg/dL — ABNORMAL HIGH (ref 8–23)
CO2: 26 mmol/L (ref 22–32)
Calcium: 8.7 mg/dL — ABNORMAL LOW (ref 8.9–10.3)
Chloride: 105 mmol/L (ref 98–111)
Creatinine, Ser: 0.82 mg/dL (ref 0.61–1.24)
GFR, Estimated: >60 mL/min (ref >60)
Glucose, Bld: 165 mg/dL — ABNORMAL HIGH (ref 70–99)
Potassium: 4.8 mmol/L (ref 3.5–5.1)
Sodium: 135 mmol/L (ref 135–145)

## 2021-07-28 LAB — GLUCOSE, CAPILLARY
Glucose-Capillary: 167 mg/dL — ABNORMAL HIGH (ref 70–99)
Glucose-Capillary: 151 mg/dL — ABNORMAL HIGH (ref 70–99)

## 2021-07-28 SURGERY — DEBRIDEMENT, WOUND
Anesthesia: Monitor Anesthesia Care | Laterality: Left

## 2021-07-28 MED ORDER — CEFAZOLIN SODIUM-DEXTROSE 2-3 GM-%(50ML) IV SOLR
INTRAVENOUS | Status: DC | PRN
  Administered 2021-07-28: 2 g via INTRAVENOUS

## 2021-07-28 MED ORDER — DEXAMETHASONE SODIUM PHOSPHATE 10 MG/ML IJ SOLN
INTRAMUSCULAR | Status: AC
  Filled 2021-07-28: qty 1

## 2021-07-28 MED ORDER — SODIUM CHLORIDE (PF) 0.9 % IJ SOLN
INTRAMUSCULAR | Status: AC
  Filled 2021-07-28: qty 10

## 2021-07-28 MED ORDER — AMISULPRIDE (ANTIEMETIC) 5 MG/2ML IV SOLN: 10.0000 mg | Freq: Once | INTRAVENOUS | Status: DC | PRN

## 2021-07-28 MED ORDER — ORAL CARE MOUTH RINSE: 15.0000 mL | Freq: Once | OROMUCOSAL | Status: AC

## 2021-07-28 MED ORDER — CEFAZOLIN SODIUM-DEXTROSE 2-4 GM/100ML-% IV SOLN
INTRAVENOUS | Status: AC
  Filled 2021-07-28: qty 100

## 2021-07-28 MED ORDER — DEXAMETHASONE SODIUM PHOSPHATE 10 MG/ML IJ SOLN
INTRAMUSCULAR | Status: DC | PRN
  Administered 2021-07-28: 5 mg via INTRAVENOUS

## 2021-07-28 MED ORDER — PHENYLEPHRINE 40 MCG/ML (10ML) SYRINGE FOR IV PUSH (FOR BLOOD PRESSURE SUPPORT)
PREFILLED_SYRINGE | INTRAVENOUS | Status: DC | PRN
  Administered 2021-07-28: 120 ug via INTRAVENOUS

## 2021-07-28 MED ORDER — BUPIVACAINE HCL (PF) 0.5 % IJ SOLN
INTRAMUSCULAR | Status: DC | PRN
  Administered 2021-07-28: 10 mL

## 2021-07-28 MED ORDER — MIDAZOLAM HCL 2 MG/2ML IJ SOLN
INTRAMUSCULAR | Status: DC | PRN
  Administered 2021-07-28: 2 mg via INTRAVENOUS

## 2021-07-28 MED ORDER — PHENYLEPHRINE 40 MCG/ML (10ML) SYRINGE FOR IV PUSH (FOR BLOOD PRESSURE SUPPORT)
PREFILLED_SYRINGE | INTRAVENOUS | Status: AC
  Filled 2021-07-28: qty 20

## 2021-07-28 MED ORDER — CHLORHEXIDINE GLUCONATE 0.12 % MT SOLN
15.0000 mL | Freq: Once | OROMUCOSAL | Status: AC
  Administered 2021-07-28: 15 mL via OROMUCOSAL

## 2021-07-28 MED ORDER — PROMETHAZINE HCL 25 MG/ML IJ SOLN: 6.2500 mg | INTRAMUSCULAR | Status: DC | PRN

## 2021-07-28 MED ORDER — LACTATED RINGERS IV SOLN: INTRAVENOUS | Status: DC

## 2021-07-28 MED ORDER — FENTANYL CITRATE PF 50 MCG/ML IJ SOSY: 25.0000 ug | PREFILLED_SYRINGE | INTRAMUSCULAR | Status: DC | PRN

## 2021-07-28 MED ORDER — MIDAZOLAM HCL 2 MG/2ML IJ SOLN
INTRAMUSCULAR | Status: AC
  Filled 2021-07-28: qty 2

## 2021-07-28 MED ORDER — ONDANSETRON HCL 4 MG/2ML IJ SOLN
INTRAMUSCULAR | Status: DC | PRN
Start: 2021-07-28 — End: 2021-07-28
  Administered 2021-07-28: 4 mg via INTRAVENOUS

## 2021-07-28 MED ORDER — PROPOFOL 500 MG/50ML IV EMUL
INTRAVENOUS | Status: DC | PRN
  Administered 2021-07-28: 100 ug/kg/min via INTRAVENOUS

## 2021-07-28 MED ORDER — PROPOFOL 1000 MG/100ML IV EMUL
INTRAVENOUS | Status: AC
  Filled 2021-07-28: qty 100

## 2021-07-28 MED ORDER — PROPOFOL 10 MG/ML IV BOLUS
INTRAVENOUS | Status: DC | PRN
  Administered 2021-07-28 (×2): 25 mg via INTRAVENOUS

## 2021-07-28 MED ORDER — BUPIVACAINE HCL (PF) 0.5 % IJ SOLN
INTRAMUSCULAR | Status: AC
  Filled 2021-07-28: qty 30

## 2021-07-28 MED ORDER — SODIUM CHLORIDE 0.9 % IR SOLN
Status: DC | PRN
  Administered 2021-07-28: 1000 mL

## 2021-07-28 MED ORDER — ONDANSETRON HCL 4 MG/2ML IJ SOLN
INTRAMUSCULAR | Status: AC
Start: 2021-07-28 — End: ?
  Filled 2021-07-28: qty 2

## 2021-07-28 SURGICAL SUPPLY — 65 items
BAG COUNTER SPONGE SURGICOUNT (BAG) IMPLANT
BLADE OSCILLATING/SAGITTAL (BLADE)
BLADE SURG 15 STRL LF DISP TIS (BLADE) ×1 IMPLANT
BLADE SURG 15 STRL SS (BLADE) ×1
BLADE SW THK.38XMED LNG THN (BLADE) IMPLANT
BNDG ELASTIC 3X5.8 VLCR STR LF (GAUZE/BANDAGES/DRESSINGS) ×1 IMPLANT
BNDG ELASTIC 4X5.8 VLCR STR LF (GAUZE/BANDAGES/DRESSINGS) ×2 IMPLANT
BNDG ESMARK 4X9 LF (GAUZE/BANDAGES/DRESSINGS) ×1 IMPLANT
BNDG GAUZE ELAST 4 BULKY (GAUZE/BANDAGES/DRESSINGS) ×2 IMPLANT
CHLORAPREP W/TINT 26 (MISCELLANEOUS) ×2 IMPLANT
CNTNR URN SCR LID CUP LEK RST (MISCELLANEOUS) IMPLANT
CONT SPEC 4OZ STRL OR WHT (MISCELLANEOUS) ×1
COVER BACK TABLE 60X90IN (DRAPES) ×1 IMPLANT
CUFF TOURN SGL QUICK 18X4 (TOURNIQUET CUFF) IMPLANT
DRAPE EXTREMITY T 121X128X90 (DISPOSABLE) ×2 IMPLANT
DRAPE IMP U-DRAPE 54X76 (DRAPES) IMPLANT
DRSG EMULSION OIL 3X3 NADH (GAUZE/BANDAGES/DRESSINGS) IMPLANT
DRSG MEPITEL 8X12 (GAUZE/BANDAGES/DRESSINGS) ×1 IMPLANT
DRSG PAD ABDOMINAL 8X10 ST (GAUZE/BANDAGES/DRESSINGS) ×1 IMPLANT
DRSG VAC ATS MED SENSATRAC (GAUZE/BANDAGES/DRESSINGS) ×1 IMPLANT
DRSG VERSA FOAM LRG 10X15 (GAUZE/BANDAGES/DRESSINGS) ×1 IMPLANT
ELECT REM PT RETURN 15FT ADLT (MISCELLANEOUS) ×2 IMPLANT
GAUZE SPONGE 4X4 12PLY STRL (GAUZE/BANDAGES/DRESSINGS) ×1 IMPLANT
GAUZE XEROFORM 1X8 LF (GAUZE/BANDAGES/DRESSINGS) IMPLANT
GAUZE XEROFORM 5X9 LF (GAUZE/BANDAGES/DRESSINGS) IMPLANT
GLOVE SRG 8 PF TXTR STRL LF DI (GLOVE) ×1 IMPLANT
GLOVE SURG ENC MOIS LTX SZ7.5 (GLOVE) ×1 IMPLANT
GLOVE SURG NEOPR MICRO LF SZ8 (GLOVE) ×2 IMPLANT
GLOVE SURG UNDER POLY LF SZ8 (GLOVE) ×1
GOWN L4 XXLG W/PAP TWL (GOWN DISPOSABLE) ×2 IMPLANT
GOWN STRL REUS W/TWL XL LVL3 (GOWN DISPOSABLE) ×3 IMPLANT
KIT BASIN OR (CUSTOM PROCEDURE TRAY) ×2 IMPLANT
KIT TURNOVER KIT A (KITS) IMPLANT
MANIFOLD NEPTUNE II (INSTRUMENTS) ×2 IMPLANT
MARKER SKIN DUAL TIP RULER LAB (MISCELLANEOUS) ×2 IMPLANT
MATRIX PURAPLY AM 5X5 COLLAGEN (Tissue) IMPLANT
NDL HYPO 25X1 1.5 SAFETY (NEEDLE) ×1 IMPLANT
NEEDLE HYPO 25X1 1.5 SAFETY (NEEDLE) ×2 IMPLANT
NS IRRIG 1000ML POUR BTL (IV SOLUTION) IMPLANT
PACK ORTHO EXTREMITY (CUSTOM PROCEDURE TRAY) ×2 IMPLANT
PADDING CAST ABS 4INX4YD NS (CAST SUPPLIES)
PADDING CAST ABS COTTON 4X4 ST (CAST SUPPLIES) ×1 IMPLANT
PENCIL SMOKE EVACUATOR (MISCELLANEOUS) IMPLANT
PROBE DEBRIDE SONICVAC MISONIX (TIP) ×1 IMPLANT
PURAPLY AM 5X5 COLLAGEN (Tissue) ×2 IMPLANT
SET IRRIG Y TYPE TUR BLADDER L (SET/KITS/TRAYS/PACK) IMPLANT
SLEEVE SCD COMPRESS KNEE MED (STOCKING) ×1 IMPLANT
SPONGE SURGIFOAM ABS GEL 100 (HEMOSTASIS) IMPLANT
SPONGE T-LAP 18X18 ~~LOC~~+RFID (SPONGE) ×2 IMPLANT
STAPLER VISISTAT 35W (STAPLE) ×1 IMPLANT
STOCKINETTE 8 INCH (MISCELLANEOUS) ×2 IMPLANT
SUT ETHILON 3 0 PS 1 (SUTURE) IMPLANT
SUT ETHILON 4 0 PS 2 18 (SUTURE) IMPLANT
SUT MNCRL AB 3-0 PS2 18 (SUTURE) IMPLANT
SUT MNCRL AB 4-0 PS2 18 (SUTURE) IMPLANT
SUT MON AB 5-0 PS2 18 (SUTURE) IMPLANT
SUT VIC AB 3-0 FS2 27 (SUTURE) IMPLANT
SUT VIC AB 4-0 PS2 18 (SUTURE) ×1 IMPLANT
SYR BULB EAR ULCER 3OZ GRN STR (SYRINGE) IMPLANT
SYR CONTROL 10ML LL (SYRINGE) ×2 IMPLANT
TOWEL OR 17X26 10 PK STRL BLUE (TOWEL DISPOSABLE) ×3 IMPLANT
TUBE IRRIGATION SET MISONIX (TUBING) ×1 IMPLANT
UNDERPAD 30X36 HEAVY ABSORB (UNDERPADS AND DIAPERS) ×2 IMPLANT
YANKAUER SUCT BULB TIP 10FT TU (MISCELLANEOUS) ×1 IMPLANT
YANKAUER SUCT BULB TIP NO VENT (SUCTIONS) ×2 IMPLANT

## 2021-07-28 NOTE — Anesthesia Procedure Notes (Signed)
Procedure Name: MAC Date/Time: 07/28/2021 1:30 PM Performed by: Claudia Desanctis, CRNA Pre-anesthesia Checklist: Patient identified, Emergency Drugs available, Suction available and Patient being monitored Patient Re-evaluated:Patient Re-evaluated prior to induction Oxygen Delivery Method: Simple face mask

## 2021-07-28 NOTE — Anesthesia Postprocedure Evaluation (Signed)
Anesthesia Post Note  Patient: [name]  Procedure(s) Performed: DEBRIDEMENT WOUND AND APPLICATION OF SKIN GRAFT SUBSTITUTE (Left) APPLICATION OF WOUND VAC (Left)     Patient location during evaluation: PACU Anesthesia Type: MAC Level of consciousness: awake and alert Pain management: pain level controlled Vital Signs Assessment: post-procedure vital signs reviewed and stable Respiratory status: spontaneous breathing and respiratory function stable Cardiovascular status: stable Postop Assessment: no apparent nausea or vomiting Anesthetic complications: no   No notable events documented.  Last Vitals:  Vitals:   07/28/21 1430 07/28/21 1445  BP: (!) 105/92 132/87  Pulse: 64 71  Resp: 13 18  Temp:  36.4 C  SpO2: 99% 100%    Last Pain:  Vitals:   07/28/21 1445  TempSrc:   PainSc: 0-No pain                 Niyana Chesbro DANIEL

## 2021-07-28 NOTE — Op Note (Addendum)
Patient Name: [name] DOB: 03/22/1956  MRN: [id number]   Date of Service: 07/28/21   Surgeon: Dr. Hardie Pulley, DPM Assistants: None Pre-operative Diagnosis: Ulcer left foot with bone involvement without necrosis Post-operative Diagnosis: same Procedures:             1) Debridement and irrigation of wound down to and including bone.  2) Application of skin graft substitute  3) Application of wound VAC Pathology/Specimens: ID Type Source Tests Collected by Time Destination  1 : Left talus bone Tissue PATH Other SURGICAL PATHOLOGY Evelina Bucy, DPM 07/28/2021 1336   A : Left talus bone Tissue PATH Other AEROBIC/ANAEROBIC CULTURE W GRAM STAIN (SURGICAL/DEEP WOUND) Evelina Bucy, DPM 07/28/2021 1334    Anesthesia: MAC/local Hemostasis: Anatomic Estimated Blood Loss: 10 ml Materials:  Implant Name Type Inv. Item Serial No. Manufacturer Lot No. LRB No. Used Action  PURAPLY AM 5X5 COLLAGEN - [id number] Tissue PURAPLY AM 5X5 COLLAGEN  [employer] [id number] Left 1 Implanted    Medications: 10 ml 0.5% marcaine plain Complications: None  Indications for Procedure:  This is a 66 y.o. male with a chronic left midfoot ulceration. He presents for debridement of the wound to promote healing of the ulceration. All risks, benefits, and alternatives of surgery were discussed no guarantees given.   Procedure in Detail: Patient was identified in pre-operative holding area. Formal consent was signed and the left lower extremity was marked. Patient was brought back to the operating room and placed on the operating room table in the supine position. Anesthesia was induced.   The extremity was prepped and draped in the usual sterile fashion. Timeout was taken to confirm patient name, laterality, and procedure prior to incision. Attention was then directed to the left foot where a wound measuring 4*3.5 was encountered.  The wound was sharply debrided with a 15 blade, followed by a  misonix ultrasonic debrider. A rongeur was used to debride a small superficial part of the bone to promote closure over the bone. Samples of the bone were sent for microbiology and pathology. Debridement was performed to bleeding, viable wound base. The wound was debrided to the level of and including bone. The bone bled well and appeared viable. Following debridement the wound measured 4x3.5.   Puraply 5x5 graft was applied to the wound, and was hydrated by gentle bleeding from the wound. Mepitel was applied over the wound. A wound VAC was applied, with a white and black sponge adhered to the wound bed, followed by occlusive dressing. The trackpad was placed and set to the wound VAC machine at 125 mmHg continuous.  The foot was then dressed with kerlix, and ACE bandage. Patient tolerated the procedure well.   Disposition: Following a period of post-operative monitoring, patient will be transferred back home.

## 2021-07-28 NOTE — Transfer of Care (Signed)
Immediate Anesthesia Transfer of Care Note  Patient: [name]  Procedure(s) Performed: DEBRIDEMENT WOUND AND APPLICATION OF SKIN GRAFT SUBSTITUTE (Left) APPLICATION OF WOUND VAC (Left)  Patient Location: PACU  Anesthesia Type:MAC  Level of Consciousness: drowsy  Airway & Oxygen Therapy: Patient Spontanous Breathing and Patient connected to face mask  Post-op Assessment: Report given to RN and Post -op Vital signs reviewed and stable  Post vital signs: Reviewed and stable  Last Vitals:  Vitals Value Taken Time  BP 94/61 07/28/21 1400  Temp    Pulse 62 07/28/21 1402  Resp 12 07/28/21 1402  SpO2 100 % 07/28/21 1402  Vitals shown include unvalidated device data.  Last Pain:  Vitals:   07/28/21 1305  TempSrc:   PainSc: 0-No pain         Complications: No notable events documented.

## 2021-07-28 NOTE — Progress Notes (Signed)
[facility] order placed for changing of the dressing.

## 2021-07-28 NOTE — Anesthesia Preprocedure Evaluation (Signed)
Anesthesia Evaluation  Patient identified by MRN, date of birth, ID band Patient awake    Reviewed: Allergy & Precautions, NPO status , Patient's Chart, lab work & pertinent test results  History of Anesthesia Complications Negative for: history of anesthetic complications  Airway Mallampati: II  TM Distance: >3 FB Neck ROM: Full    Dental no notable dental hx.    Pulmonary neg pulmonary ROS,    Pulmonary exam normal breath sounds clear to auscultation       Cardiovascular hypertension, Pt. on medications and Pt. on home beta blockers + CAD, + Past MI and + CABG  Normal cardiovascular exam Rhythm:Regular Rate:Normal   Mitral valve: Dilated mitral annulus. Leaflet thickening/calcification  is present. Mild mitral annular calcification. Mild regurgitation.   Aortic valve: The valve is trileaflet. Mild valve thickening present. No  stenosis. No regurgitation.   Tricuspid valve: Trace regurgitation.   Septum: Small Patent Foramen Ovale present with bi-directional shunt  visualized by color doppler and saline contrast.   Left atrium: Patent foramen ovale present with bi-directional shunting  indicated by color flow Doppler and saline contrast.   Left atrium: Spontaneous echo contrast.   Right ventricle: Normal wall thickness and ejection fraction. Cavity is  mildly dilated.     Neuro/Psych negative neurological ROS  negative psych ROS   GI/Hepatic negative GI ROS, Neg liver ROS,   Endo/Other  diabetes, Oral Hypoglycemic Agents  Renal/GU negative Renal ROS     Musculoskeletal negative musculoskeletal ROS (+)   Abdominal   Peds  Hematology negative hematology ROS (+)   Anesthesia Other Findings ulcer left foot  Reproductive/Obstetrics                             Anesthesia Physical  Anesthesia Plan  ASA: 3  Anesthesia Plan: MAC   Post-op Pain Management:    Induction:  Intravenous  PONV Risk Score and Plan: 2 and Ondansetron, Dexamethasone, Propofol infusion, Midazolam and Treatment may vary due to age or medical condition  Airway Management Planned: Simple Face Mask  Additional Equipment:   Intra-op Plan:   Post-operative Plan:   Informed Consent: I have reviewed the patients History and Physical, chart, labs and discussed the procedure including the risks, benefits and alternatives for the proposed anesthesia with the patient or authorized representative who has indicated his/her understanding and acceptance.     Dental advisory given  Plan Discussed with: CRNA and Anesthesiologist  Anesthesia Plan Comments:         Anesthesia Quick Evaluation

## 2021-07-28 NOTE — Interval H&P Note (Signed)
History and Physical Interval Note:  07/28/2021 1:01 PM  [name]  has presented today for surgery, with the diagnosis of Ulcer left foot.  The various methods of treatment have been discussed with the patient and family. After consideration of risks, benefits and other options for treatment, the patient has consented to  Procedure(s): DEBRIDEMENT WOUND AND APPLICATION OF SKIN GRAFT SUBSTITUTE (Left) APPLICATION OF WOUND VAC (Left) as a surgical intervention.  The patient's history has been reviewed, patient examined, no change in status, stable for surgery.  I have reviewed the patient's chart and labs.  Questions were answered to the patient's satisfaction.     Park Liter

## 2021-07-28 NOTE — Discharge Instructions (Addendum)
  After Surgery Instructions   1) If you are recuperating from surgery anywhere other than home, please be sure to leave us the number where you can be reached.  2) Go directly home and rest.  3) Keep the operated foot(feet) elevated six inches above the hip when sitting or lying down. This will help control swelling and pain.  4) Support the elevated foot and leg with pillows. DO NOT PLACE PILLOWS UNDER THE KNEE.  5) DO NOT REMOVE or get your bandages WET, unless you were given different instructions by your doctor to do so. This increases the risk of infection.  6) Wear your surgical shoe or surgical boot at all times when you are up on your feet.  7) A limited amount of pain and swelling may occur. The skin may take on a bruised appearance. DO NOT BE ALARMED, THIS IS NORMAL.  8) For slight pain and swelling, apply an ice pack directly over the bandages for 15 minutes only out of each hour of the day. Continue until seen in the office for your first post op visit. DO NOT APPLY ANY FORM OF HEAT TO THE AREA.  9) Have prescriptions filled immediately and take as directed.  10) Drink lots of liquids, water and juice to stay hydrated.  11) CALL IMMEDIATELY IF:  *Bleeding continues until the following day of surgery  *Pain increases and/or does not respond to medication  *Bandages or cast appears to tight  *If your bandage gets wet  *Trip, fall or stump your surgical foot  *If your temperature goes above 101  *If you have ANY questions at all  12) You are expected to be non-weightbearing after your surgery.   If you need to reach the nurse for any reason, please call: [place]: [phone] [employer]: [phone] [place]: [phone]  

## 2021-07-29 ENCOUNTER — Encounter (HOSPITAL_COMMUNITY): Payer: Self-pay | Admitting: Podiatry

## 2021-07-29 DIAGNOSIS — T8131XA Disruption of external operation (surgical) wound, not elsewhere classified, initial encounter: Secondary | ICD-10-CM | POA: Diagnosis not present

## 2021-07-30 DIAGNOSIS — I252 Old myocardial infarction: Secondary | ICD-10-CM | POA: Diagnosis not present

## 2021-07-30 DIAGNOSIS — I10 Essential (primary) hypertension: Secondary | ICD-10-CM | POA: Diagnosis not present

## 2021-07-30 DIAGNOSIS — E11610 Type 2 diabetes mellitus with diabetic neuropathic arthropathy: Secondary | ICD-10-CM | POA: Diagnosis not present

## 2021-07-30 DIAGNOSIS — M1990 Unspecified osteoarthritis, unspecified site: Secondary | ICD-10-CM | POA: Diagnosis not present

## 2021-07-30 DIAGNOSIS — T8131XA Disruption of external operation (surgical) wound, not elsewhere classified, initial encounter: Secondary | ICD-10-CM | POA: Diagnosis not present

## 2021-07-30 DIAGNOSIS — Z794 Long term (current) use of insulin: Secondary | ICD-10-CM | POA: Diagnosis not present

## 2021-07-30 DIAGNOSIS — E785 Hyperlipidemia, unspecified: Secondary | ICD-10-CM | POA: Diagnosis not present

## 2021-07-30 DIAGNOSIS — Z7982 Long term (current) use of aspirin: Secondary | ICD-10-CM | POA: Diagnosis not present

## 2021-07-30 DIAGNOSIS — Z951 Presence of aortocoronary bypass graft: Secondary | ICD-10-CM | POA: Diagnosis not present

## 2021-07-30 DIAGNOSIS — L97522 Non-pressure chronic ulcer of other part of left foot with fat layer exposed: Secondary | ICD-10-CM | POA: Diagnosis not present

## 2021-07-30 DIAGNOSIS — K219 Gastro-esophageal reflux disease without esophagitis: Secondary | ICD-10-CM | POA: Diagnosis not present

## 2021-07-30 DIAGNOSIS — E11621 Type 2 diabetes mellitus with foot ulcer: Secondary | ICD-10-CM | POA: Diagnosis not present

## 2021-07-30 DIAGNOSIS — Z7984 Long term (current) use of oral hypoglycemic drugs: Secondary | ICD-10-CM | POA: Diagnosis not present

## 2021-07-30 DIAGNOSIS — I2510 Atherosclerotic heart disease of native coronary artery without angina pectoris: Secondary | ICD-10-CM | POA: Diagnosis not present

## 2021-07-30 DIAGNOSIS — Z9181 History of falling: Secondary | ICD-10-CM | POA: Diagnosis not present

## 2021-07-30 DIAGNOSIS — D649 Anemia, unspecified: Secondary | ICD-10-CM | POA: Diagnosis not present

## 2021-07-30 DIAGNOSIS — E1140 Type 2 diabetes mellitus with diabetic neuropathy, unspecified: Secondary | ICD-10-CM | POA: Diagnosis not present

## 2021-07-30 LAB — SURGICAL PATHOLOGY

## 2021-07-31 DIAGNOSIS — T8131XA Disruption of external operation (surgical) wound, not elsewhere classified, initial encounter: Secondary | ICD-10-CM | POA: Diagnosis not present

## 2021-08-01 DIAGNOSIS — T8131XA Disruption of external operation (surgical) wound, not elsewhere classified, initial encounter: Secondary | ICD-10-CM | POA: Diagnosis not present

## 2021-08-02 ENCOUNTER — Encounter: Payer: Self-pay | Admitting: Podiatry

## 2021-08-02 ENCOUNTER — Other Ambulatory Visit: Payer: Self-pay | Admitting: Podiatry

## 2021-08-02 ENCOUNTER — Ambulatory Visit (INDEPENDENT_AMBULATORY_CARE_PROVIDER_SITE_OTHER): Payer: BC Managed Care – PPO | Admitting: Podiatry

## 2021-08-02 ENCOUNTER — Telehealth: Payer: Self-pay | Admitting: Podiatry

## 2021-08-02 VITALS — Temp 97.4°F

## 2021-08-02 DIAGNOSIS — E08621 Diabetes mellitus due to underlying condition with foot ulcer: Secondary | ICD-10-CM

## 2021-08-02 DIAGNOSIS — L97423 Non-pressure chronic ulcer of left heel and midfoot with necrosis of muscle: Secondary | ICD-10-CM

## 2021-08-02 DIAGNOSIS — E11621 Type 2 diabetes mellitus with foot ulcer: Secondary | ICD-10-CM

## 2021-08-02 DIAGNOSIS — T8131XA Disruption of external operation (surgical) wound, not elsewhere classified, initial encounter: Secondary | ICD-10-CM | POA: Diagnosis not present

## 2021-08-02 LAB — AEROBIC/ANAEROBIC CULTURE W GRAM STAIN (SURGICAL/DEEP WOUND)

## 2021-08-02 MED ORDER — DOXYCYCLINE MONOHYDRATE 100 MG PO CAPS: 100.0000 mg | ORAL_CAPSULE | Freq: Two times a day (BID) | ORAL | 0 refills | Status: DC

## 2021-08-02 NOTE — Telephone Encounter (Signed)
Are they asking to see me instead of going to the wound care center? Infectious disease referral from Dr Ralene Cork is for the biopsy results

## 2021-08-02 NOTE — Progress Notes (Signed)
Subjective:  Patient ID: [name], male    DOB: [birth year],  MRN: [id number]  Chief Complaint  Patient presents with   Routine Post Op    I have had surgery on the left foot and they used a graph     DOS: 07/28/21 Procedure: Left  ankle irrigation and debridement of wound with application of skin graft and wound VAC application.   66 y.o. male returns for POV#1. Relates he is doing well with no pain. Relates he has had vac changes twice a week with nurses. Surgery was with Dr. March Rummage.   Review of Systems: Negative except as noted in the HPI. Denies N/V/F/Ch.  Past Medical History:  Diagnosis Date   Coronary artery disease    Diabetes mellitus without complication (HCC)    Type II   GERD (gastroesophageal reflux disease)    Hypertension    Myocardial infarction [facility])    "Silent"   Neuropathy     Current Outpatient Medications:    doxycycline (MONODOX) 100 MG capsule, Take 1 capsule (100 mg total) by mouth 2 (two) times daily for 7 days., Disp: 14 capsule, Rfl: 0   acetaminophen (TYLENOL) 500 MG tablet, Take 2 tablets (1,000 mg total) by mouth every 6 (six) hours as needed for mild pain or fever., Disp: 30 tablet, Rfl: 0   aspirin EC 81 MG tablet, Take 81 mg by mouth daily., Disp: , Rfl:    atorvastatin (LIPITOR) 80 MG tablet, Take 80 mg by mouth daily., Disp: , Rfl:    calcium carbonate (TUMS - DOSED IN MG ELEMENTAL CALCIUM) 500 MG chewable tablet, Chew 1-2 tablets by mouth daily as needed for indigestion or heartburn., Disp: , Rfl:    ibuprofen (ADVIL) 200 MG tablet, Take 400 mg by mouth every 8 (eight) hours as needed (pain.)., Disp: , Rfl:    LANTUS 100 UNIT/ML injection, Inject 28 Units into the skin 2 (two) times daily., Disp: , Rfl:    lisinopril (ZESTRIL) 10 MG tablet, Take 10 mg by mouth in the morning., Disp: , Rfl:    metFORMIN (GLUCOPHAGE) 850 MG tablet, Take 850 mg by mouth in the morning, at noon, and at bedtime., Disp: , Rfl:    metoprolol succinate  (TOPROL-XL) 50 MG 24 hr tablet, Take 50 mg by mouth daily. Take with or immediately following a meal., Disp: , Rfl:    sitaGLIPtin (JANUVIA) 100 MG tablet, Take 100 mg by mouth every morning. , Disp: , Rfl:    Tetrahydrozoline HCl (VISINE OP), Place 1 drop into both eyes daily as needed (redness)., Disp: , Rfl:   Social History   Tobacco Use  Smoking Status Never  Smokeless Tobacco Never    Allergies  Allergen Reactions   Codeine Sulfate [Codeine] Nausea Only   Objective:   Vitals:   08/02/21 1125  Temp: (!) 97.4 F (36.3 C)   There is no height or weight on file to calculate BMI. Constitutional Well developed. Well nourished.  Vascular Foot warm and well perfused. Capillary refill normal to all digits.   Neurologic Normal speech. Oriented to person, place, and time. Epicritic sensation to light touch grossly present bilaterally.  Derm: Left medial ankle wound measuirng 4.5 cm x 4 cm with mixed granular fibrotic base and some serosanguinous dranage present.Mild erythema surrounding.   Orthopedic: Tenderness to palpation noted about the surgical site.    Assessment:   1. Diabetic ulcer of left midfoot associated with type 2 diabetes mellitus, with necrosis of muscle ([place])  Plan:  Patient was evaluated and treated and all questions answered.  S/p foot surgery left ankle wound I&D and graft and vac placement.  -Reviewed biopsy and culture results with patient that show infection in bone and growing corynebacterium. Discussed with him the risk of limb loss and patient expressed understanding but wants to to whatever he can to safe the leg. Will referr to ID and Wound care for assistance with healing this wound.  -WB Status: NWB in CAM boot  - Wound redressed with Vac at 125 mmHg.  -Referral to wound care placed to evaluate wound for graft options and treatment of wound.  -Referral to ID placed for instruction on antibiotics in the setting of osteomyelitis of left talus.   -Prescription for doxycycline provided.  Patient to follow-up in two weeks for post-op if unable to get in sooner with Wound care.   No follow-ups on file.

## 2021-08-02 NOTE — Telephone Encounter (Signed)
Pt states he wants his referral to wound care to be sent to [facility] or [facility].  He does not want to go to [name].    Of note, wife called asking if there was another provider within the practice that would follow wound care for him.  Wife has already left msg in [place] office for Dr Lilian Kapur to see if he will follow pt's case.  She was also concerned because they had not heard anything yet about referrals (just seen this morning) I advised that referrals usually take 7-10 days depending on other facilities.  She wanted to know if that was good for his health.  I advised I was not a doctor or worked in clinical & I would send msg and have someone return the call.

## 2021-08-02 NOTE — Telephone Encounter (Signed)
Yes they are wanting to see you instead for wound care, she said that you rounded on her husband once when he was in the hospital. I will call them back and let them know that the referral to infectious disease is for the biopsy results.

## 2021-08-02 NOTE — Telephone Encounter (Signed)
I will send over a referral to [place]. I do plan to see him in the meantime until his appointments get scheduled.

## 2021-08-02 NOTE — Telephone Encounter (Signed)
Called patient and let the know to set up appointment with infectious disease and wound care center, gave them the number to infectious disease and they were going to call as soon as we finished our conversation to get an appointment set up .   I changed the appointment for March from Dr Ralene Cork to you for the 2nd appt follow up.  Patient wife wanted to know if this is a good antibiotic for her husband to be taking?  Dr Ralene Cork called it in this afternoon? doxycycline (MONODOX) 100 MG capsule [[id number]]

## 2021-08-02 NOTE — Telephone Encounter (Signed)
Patient wife called - she wants to know if you will follow his wound care needs ? Her husband was a patient of Dr Samuella Cota and he seen Dr Ralene Cork today and she wants to refer them to wound care and infectious disease and they would like him followed through the office?   They found out that the biopsy that Dr Samuella Cota order came back positive today and they would like to know what the next steps will be.   Please advise

## 2021-08-02 NOTE — Telephone Encounter (Signed)
OK that is fine. They should keep / go ahead and schedule the ID appointment. I think it's fine to schedule wound care as well (they are usually a couple months out) and then have a f/u with me scheduled

## 2021-08-03 DIAGNOSIS — T8131XA Disruption of external operation (surgical) wound, not elsewhere classified, initial encounter: Secondary | ICD-10-CM | POA: Diagnosis not present

## 2021-08-03 NOTE — Telephone Encounter (Signed)
Left message on voicemail  for patient to start antibiotic and if there is any other questions to please call us back.

## 2021-08-04 DIAGNOSIS — I252 Old myocardial infarction: Secondary | ICD-10-CM | POA: Diagnosis not present

## 2021-08-04 DIAGNOSIS — I10 Essential (primary) hypertension: Secondary | ICD-10-CM | POA: Diagnosis not present

## 2021-08-04 DIAGNOSIS — T8131XA Disruption of external operation (surgical) wound, not elsewhere classified, initial encounter: Secondary | ICD-10-CM | POA: Diagnosis not present

## 2021-08-04 DIAGNOSIS — D649 Anemia, unspecified: Secondary | ICD-10-CM | POA: Diagnosis not present

## 2021-08-04 DIAGNOSIS — E11621 Type 2 diabetes mellitus with foot ulcer: Secondary | ICD-10-CM | POA: Diagnosis not present

## 2021-08-04 DIAGNOSIS — Z794 Long term (current) use of insulin: Secondary | ICD-10-CM | POA: Diagnosis not present

## 2021-08-04 DIAGNOSIS — M1990 Unspecified osteoarthritis, unspecified site: Secondary | ICD-10-CM | POA: Diagnosis not present

## 2021-08-04 DIAGNOSIS — Z9181 History of falling: Secondary | ICD-10-CM | POA: Diagnosis not present

## 2021-08-04 DIAGNOSIS — Z7982 Long term (current) use of aspirin: Secondary | ICD-10-CM | POA: Diagnosis not present

## 2021-08-04 DIAGNOSIS — Z951 Presence of aortocoronary bypass graft: Secondary | ICD-10-CM | POA: Diagnosis not present

## 2021-08-04 DIAGNOSIS — E11610 Type 2 diabetes mellitus with diabetic neuropathic arthropathy: Secondary | ICD-10-CM | POA: Diagnosis not present

## 2021-08-04 DIAGNOSIS — E1140 Type 2 diabetes mellitus with diabetic neuropathy, unspecified: Secondary | ICD-10-CM | POA: Diagnosis not present

## 2021-08-04 DIAGNOSIS — Z7984 Long term (current) use of oral hypoglycemic drugs: Secondary | ICD-10-CM | POA: Diagnosis not present

## 2021-08-04 DIAGNOSIS — I2510 Atherosclerotic heart disease of native coronary artery without angina pectoris: Secondary | ICD-10-CM | POA: Diagnosis not present

## 2021-08-04 DIAGNOSIS — E785 Hyperlipidemia, unspecified: Secondary | ICD-10-CM | POA: Diagnosis not present

## 2021-08-04 DIAGNOSIS — K219 Gastro-esophageal reflux disease without esophagitis: Secondary | ICD-10-CM | POA: Diagnosis not present

## 2021-08-04 DIAGNOSIS — L97522 Non-pressure chronic ulcer of other part of left foot with fat layer exposed: Secondary | ICD-10-CM | POA: Diagnosis not present

## 2021-08-05 ENCOUNTER — Telehealth: Payer: Self-pay | Admitting: Podiatry

## 2021-08-05 ENCOUNTER — Encounter: Payer: BC Managed Care – PPO | Admitting: Podiatry

## 2021-08-05 DIAGNOSIS — T8131XA Disruption of external operation (surgical) wound, not elsewhere classified, initial encounter: Secondary | ICD-10-CM | POA: Diagnosis not present

## 2021-08-05 NOTE — Telephone Encounter (Signed)
Received call from [facility] and they received a referral for the wound that had a graph and wound vac and they do not do wound care for those pts. He stated he has been trying to get int ouch with the [place] office all week. Please call him if you can give him more information. His # is [phone]

## 2021-08-05 NOTE — Telephone Encounter (Signed)
I have tried to call the wound care center twice today after they left a message and I had to leave another voice mail. Misty Stanley

## 2021-08-05 NOTE — Telephone Encounter (Signed)
Misty Stanley the guy that called was Timothy Lasso if you could get in touch with him they are not going to see this pt for the wound care.

## 2021-08-06 ENCOUNTER — Other Ambulatory Visit: Payer: Self-pay | Admitting: Podiatry

## 2021-08-06 ENCOUNTER — Telehealth: Payer: Self-pay | Admitting: *Deleted

## 2021-08-06 DIAGNOSIS — Z794 Long term (current) use of insulin: Secondary | ICD-10-CM | POA: Diagnosis not present

## 2021-08-06 DIAGNOSIS — I2510 Atherosclerotic heart disease of native coronary artery without angina pectoris: Secondary | ICD-10-CM | POA: Diagnosis not present

## 2021-08-06 DIAGNOSIS — I10 Essential (primary) hypertension: Secondary | ICD-10-CM | POA: Diagnosis not present

## 2021-08-06 DIAGNOSIS — T8131XA Disruption of external operation (surgical) wound, not elsewhere classified, initial encounter: Secondary | ICD-10-CM | POA: Diagnosis not present

## 2021-08-06 DIAGNOSIS — L97522 Non-pressure chronic ulcer of other part of left foot with fat layer exposed: Secondary | ICD-10-CM | POA: Diagnosis not present

## 2021-08-06 DIAGNOSIS — Z951 Presence of aortocoronary bypass graft: Secondary | ICD-10-CM | POA: Diagnosis not present

## 2021-08-06 DIAGNOSIS — E11610 Type 2 diabetes mellitus with diabetic neuropathic arthropathy: Secondary | ICD-10-CM | POA: Diagnosis not present

## 2021-08-06 DIAGNOSIS — M1990 Unspecified osteoarthritis, unspecified site: Secondary | ICD-10-CM | POA: Diagnosis not present

## 2021-08-06 DIAGNOSIS — Z9181 History of falling: Secondary | ICD-10-CM | POA: Diagnosis not present

## 2021-08-06 DIAGNOSIS — E1140 Type 2 diabetes mellitus with diabetic neuropathy, unspecified: Secondary | ICD-10-CM | POA: Diagnosis not present

## 2021-08-06 DIAGNOSIS — Z7984 Long term (current) use of oral hypoglycemic drugs: Secondary | ICD-10-CM | POA: Diagnosis not present

## 2021-08-06 DIAGNOSIS — I252 Old myocardial infarction: Secondary | ICD-10-CM | POA: Diagnosis not present

## 2021-08-06 DIAGNOSIS — D649 Anemia, unspecified: Secondary | ICD-10-CM | POA: Diagnosis not present

## 2021-08-06 DIAGNOSIS — Z7982 Long term (current) use of aspirin: Secondary | ICD-10-CM | POA: Diagnosis not present

## 2021-08-06 DIAGNOSIS — E785 Hyperlipidemia, unspecified: Secondary | ICD-10-CM | POA: Diagnosis not present

## 2021-08-06 DIAGNOSIS — E11621 Type 2 diabetes mellitus with foot ulcer: Secondary | ICD-10-CM | POA: Diagnosis not present

## 2021-08-06 DIAGNOSIS — K219 Gastro-esophageal reflux disease without esophagitis: Secondary | ICD-10-CM | POA: Diagnosis not present

## 2021-08-06 MED ORDER — DOXYCYCLINE MONOHYDRATE 100 MG PO CAPS: 100.0000 mg | ORAL_CAPSULE | Freq: Two times a day (BID) | ORAL | 0 refills | Status: AC

## 2021-08-06 NOTE — Telephone Encounter (Signed)
Patients wife called he only has 2 days left of antibiotic , she would like a refill called in for the weekend.  She also wanted to let you know that the patient fell and punctured near the area of his wound.

## 2021-08-06 NOTE — Telephone Encounter (Signed)
I called and spoke with the wound care center today and they stated that they can not see the patient due to they do not do the graphs and just wanted [place] to know so that we could refer to another facility. Misty Stanley

## 2021-08-06 NOTE — Telephone Encounter (Signed)
Patient is calling for a refill on doxycycline, please advise.

## 2021-08-07 DIAGNOSIS — T8131XA Disruption of external operation (surgical) wound, not elsewhere classified, initial encounter: Secondary | ICD-10-CM | POA: Diagnosis not present

## 2021-08-08 DIAGNOSIS — T8131XA Disruption of external operation (surgical) wound, not elsewhere classified, initial encounter: Secondary | ICD-10-CM | POA: Diagnosis not present

## 2021-08-09 DIAGNOSIS — Z9181 History of falling: Secondary | ICD-10-CM | POA: Diagnosis not present

## 2021-08-09 DIAGNOSIS — I252 Old myocardial infarction: Secondary | ICD-10-CM | POA: Diagnosis not present

## 2021-08-09 DIAGNOSIS — Z794 Long term (current) use of insulin: Secondary | ICD-10-CM | POA: Diagnosis not present

## 2021-08-09 DIAGNOSIS — E11621 Type 2 diabetes mellitus with foot ulcer: Secondary | ICD-10-CM | POA: Diagnosis not present

## 2021-08-09 DIAGNOSIS — E1140 Type 2 diabetes mellitus with diabetic neuropathy, unspecified: Secondary | ICD-10-CM | POA: Diagnosis not present

## 2021-08-09 DIAGNOSIS — I10 Essential (primary) hypertension: Secondary | ICD-10-CM | POA: Diagnosis not present

## 2021-08-09 DIAGNOSIS — Z7982 Long term (current) use of aspirin: Secondary | ICD-10-CM | POA: Diagnosis not present

## 2021-08-09 DIAGNOSIS — T8131XA Disruption of external operation (surgical) wound, not elsewhere classified, initial encounter: Secondary | ICD-10-CM | POA: Diagnosis not present

## 2021-08-09 DIAGNOSIS — Z7984 Long term (current) use of oral hypoglycemic drugs: Secondary | ICD-10-CM | POA: Diagnosis not present

## 2021-08-09 DIAGNOSIS — M1990 Unspecified osteoarthritis, unspecified site: Secondary | ICD-10-CM | POA: Diagnosis not present

## 2021-08-09 DIAGNOSIS — E11610 Type 2 diabetes mellitus with diabetic neuropathic arthropathy: Secondary | ICD-10-CM | POA: Diagnosis not present

## 2021-08-09 DIAGNOSIS — D649 Anemia, unspecified: Secondary | ICD-10-CM | POA: Diagnosis not present

## 2021-08-09 DIAGNOSIS — E785 Hyperlipidemia, unspecified: Secondary | ICD-10-CM | POA: Diagnosis not present

## 2021-08-09 DIAGNOSIS — I2510 Atherosclerotic heart disease of native coronary artery without angina pectoris: Secondary | ICD-10-CM | POA: Diagnosis not present

## 2021-08-09 DIAGNOSIS — L97522 Non-pressure chronic ulcer of other part of left foot with fat layer exposed: Secondary | ICD-10-CM | POA: Diagnosis not present

## 2021-08-09 DIAGNOSIS — K219 Gastro-esophageal reflux disease without esophagitis: Secondary | ICD-10-CM | POA: Diagnosis not present

## 2021-08-09 DIAGNOSIS — Z951 Presence of aortocoronary bypass graft: Secondary | ICD-10-CM | POA: Diagnosis not present

## 2021-08-10 DIAGNOSIS — T8131XA Disruption of external operation (surgical) wound, not elsewhere classified, initial encounter: Secondary | ICD-10-CM | POA: Diagnosis not present

## 2021-08-11 DIAGNOSIS — I2510 Atherosclerotic heart disease of native coronary artery without angina pectoris: Secondary | ICD-10-CM | POA: Diagnosis not present

## 2021-08-11 DIAGNOSIS — M1990 Unspecified osteoarthritis, unspecified site: Secondary | ICD-10-CM | POA: Diagnosis not present

## 2021-08-11 DIAGNOSIS — Z7982 Long term (current) use of aspirin: Secondary | ICD-10-CM | POA: Diagnosis not present

## 2021-08-11 DIAGNOSIS — Z794 Long term (current) use of insulin: Secondary | ICD-10-CM | POA: Diagnosis not present

## 2021-08-11 DIAGNOSIS — E1140 Type 2 diabetes mellitus with diabetic neuropathy, unspecified: Secondary | ICD-10-CM | POA: Diagnosis not present

## 2021-08-11 DIAGNOSIS — E11610 Type 2 diabetes mellitus with diabetic neuropathic arthropathy: Secondary | ICD-10-CM | POA: Diagnosis not present

## 2021-08-11 DIAGNOSIS — Z7984 Long term (current) use of oral hypoglycemic drugs: Secondary | ICD-10-CM | POA: Diagnosis not present

## 2021-08-11 DIAGNOSIS — I10 Essential (primary) hypertension: Secondary | ICD-10-CM | POA: Diagnosis not present

## 2021-08-11 DIAGNOSIS — E785 Hyperlipidemia, unspecified: Secondary | ICD-10-CM | POA: Diagnosis not present

## 2021-08-11 DIAGNOSIS — Z9181 History of falling: Secondary | ICD-10-CM | POA: Diagnosis not present

## 2021-08-11 DIAGNOSIS — I252 Old myocardial infarction: Secondary | ICD-10-CM | POA: Diagnosis not present

## 2021-08-11 DIAGNOSIS — D649 Anemia, unspecified: Secondary | ICD-10-CM | POA: Diagnosis not present

## 2021-08-11 DIAGNOSIS — E11621 Type 2 diabetes mellitus with foot ulcer: Secondary | ICD-10-CM | POA: Diagnosis not present

## 2021-08-11 DIAGNOSIS — T8131XA Disruption of external operation (surgical) wound, not elsewhere classified, initial encounter: Secondary | ICD-10-CM | POA: Diagnosis not present

## 2021-08-11 DIAGNOSIS — L97522 Non-pressure chronic ulcer of other part of left foot with fat layer exposed: Secondary | ICD-10-CM | POA: Diagnosis not present

## 2021-08-11 DIAGNOSIS — K219 Gastro-esophageal reflux disease without esophagitis: Secondary | ICD-10-CM | POA: Diagnosis not present

## 2021-08-11 DIAGNOSIS — Z951 Presence of aortocoronary bypass graft: Secondary | ICD-10-CM | POA: Diagnosis not present

## 2021-08-12 DIAGNOSIS — T8131XA Disruption of external operation (surgical) wound, not elsewhere classified, initial encounter: Secondary | ICD-10-CM | POA: Diagnosis not present

## 2021-08-13 DIAGNOSIS — E1140 Type 2 diabetes mellitus with diabetic neuropathy, unspecified: Secondary | ICD-10-CM | POA: Diagnosis not present

## 2021-08-13 DIAGNOSIS — E11610 Type 2 diabetes mellitus with diabetic neuropathic arthropathy: Secondary | ICD-10-CM | POA: Diagnosis not present

## 2021-08-13 DIAGNOSIS — I252 Old myocardial infarction: Secondary | ICD-10-CM | POA: Diagnosis not present

## 2021-08-13 DIAGNOSIS — T8131XA Disruption of external operation (surgical) wound, not elsewhere classified, initial encounter: Secondary | ICD-10-CM | POA: Diagnosis not present

## 2021-08-13 DIAGNOSIS — Z794 Long term (current) use of insulin: Secondary | ICD-10-CM | POA: Diagnosis not present

## 2021-08-13 DIAGNOSIS — Z7982 Long term (current) use of aspirin: Secondary | ICD-10-CM | POA: Diagnosis not present

## 2021-08-13 DIAGNOSIS — E11621 Type 2 diabetes mellitus with foot ulcer: Secondary | ICD-10-CM | POA: Diagnosis not present

## 2021-08-13 DIAGNOSIS — M1990 Unspecified osteoarthritis, unspecified site: Secondary | ICD-10-CM | POA: Diagnosis not present

## 2021-08-13 DIAGNOSIS — I2510 Atherosclerotic heart disease of native coronary artery without angina pectoris: Secondary | ICD-10-CM | POA: Diagnosis not present

## 2021-08-13 DIAGNOSIS — E785 Hyperlipidemia, unspecified: Secondary | ICD-10-CM | POA: Diagnosis not present

## 2021-08-13 DIAGNOSIS — L97522 Non-pressure chronic ulcer of other part of left foot with fat layer exposed: Secondary | ICD-10-CM | POA: Diagnosis not present

## 2021-08-13 DIAGNOSIS — I10 Essential (primary) hypertension: Secondary | ICD-10-CM | POA: Diagnosis not present

## 2021-08-13 DIAGNOSIS — K219 Gastro-esophageal reflux disease without esophagitis: Secondary | ICD-10-CM | POA: Diagnosis not present

## 2021-08-13 DIAGNOSIS — Z951 Presence of aortocoronary bypass graft: Secondary | ICD-10-CM | POA: Diagnosis not present

## 2021-08-13 DIAGNOSIS — Z9181 History of falling: Secondary | ICD-10-CM | POA: Diagnosis not present

## 2021-08-13 DIAGNOSIS — S91302A Unspecified open wound, left foot, initial encounter: Secondary | ICD-10-CM | POA: Diagnosis not present

## 2021-08-13 DIAGNOSIS — Z7984 Long term (current) use of oral hypoglycemic drugs: Secondary | ICD-10-CM | POA: Diagnosis not present

## 2021-08-13 DIAGNOSIS — D649 Anemia, unspecified: Secondary | ICD-10-CM | POA: Diagnosis not present

## 2021-08-14 DIAGNOSIS — T8131XA Disruption of external operation (surgical) wound, not elsewhere classified, initial encounter: Secondary | ICD-10-CM | POA: Diagnosis not present

## 2021-08-15 DIAGNOSIS — T8131XA Disruption of external operation (surgical) wound, not elsewhere classified, initial encounter: Secondary | ICD-10-CM | POA: Diagnosis not present

## 2021-08-16 ENCOUNTER — Other Ambulatory Visit: Payer: Self-pay

## 2021-08-16 ENCOUNTER — Encounter: Payer: Self-pay | Admitting: Internal Medicine

## 2021-08-16 ENCOUNTER — Encounter: Payer: BC Managed Care – PPO | Admitting: Podiatry

## 2021-08-16 ENCOUNTER — Ambulatory Visit (INDEPENDENT_AMBULATORY_CARE_PROVIDER_SITE_OTHER): Payer: BC Managed Care – PPO | Admitting: Internal Medicine

## 2021-08-16 VITALS — BP 124/75 | HR 84 | Temp 97.7°F

## 2021-08-16 DIAGNOSIS — E11621 Type 2 diabetes mellitus with foot ulcer: Secondary | ICD-10-CM | POA: Diagnosis not present

## 2021-08-16 DIAGNOSIS — K219 Gastro-esophageal reflux disease without esophagitis: Secondary | ICD-10-CM | POA: Diagnosis not present

## 2021-08-16 DIAGNOSIS — L97403 Non-pressure chronic ulcer of unspecified heel and midfoot with necrosis of muscle: Secondary | ICD-10-CM | POA: Diagnosis not present

## 2021-08-16 DIAGNOSIS — M869 Osteomyelitis, unspecified: Secondary | ICD-10-CM

## 2021-08-16 DIAGNOSIS — T8131XA Disruption of external operation (surgical) wound, not elsewhere classified, initial encounter: Secondary | ICD-10-CM | POA: Diagnosis not present

## 2021-08-16 DIAGNOSIS — L97522 Non-pressure chronic ulcer of other part of left foot with fat layer exposed: Secondary | ICD-10-CM | POA: Diagnosis not present

## 2021-08-16 DIAGNOSIS — D649 Anemia, unspecified: Secondary | ICD-10-CM | POA: Diagnosis not present

## 2021-08-16 DIAGNOSIS — Z9181 History of falling: Secondary | ICD-10-CM | POA: Diagnosis not present

## 2021-08-16 DIAGNOSIS — Z7984 Long term (current) use of oral hypoglycemic drugs: Secondary | ICD-10-CM | POA: Diagnosis not present

## 2021-08-16 DIAGNOSIS — Z794 Long term (current) use of insulin: Secondary | ICD-10-CM | POA: Diagnosis not present

## 2021-08-16 DIAGNOSIS — E08621 Diabetes mellitus due to underlying condition with foot ulcer: Secondary | ICD-10-CM | POA: Diagnosis not present

## 2021-08-16 DIAGNOSIS — I252 Old myocardial infarction: Secondary | ICD-10-CM | POA: Diagnosis not present

## 2021-08-16 DIAGNOSIS — I739 Peripheral vascular disease, unspecified: Secondary | ICD-10-CM | POA: Diagnosis not present

## 2021-08-16 DIAGNOSIS — E1140 Type 2 diabetes mellitus with diabetic neuropathy, unspecified: Secondary | ICD-10-CM

## 2021-08-16 DIAGNOSIS — Z951 Presence of aortocoronary bypass graft: Secondary | ICD-10-CM | POA: Diagnosis not present

## 2021-08-16 DIAGNOSIS — I10 Essential (primary) hypertension: Secondary | ICD-10-CM | POA: Diagnosis not present

## 2021-08-16 DIAGNOSIS — I2510 Atherosclerotic heart disease of native coronary artery without angina pectoris: Secondary | ICD-10-CM | POA: Diagnosis not present

## 2021-08-16 DIAGNOSIS — M1990 Unspecified osteoarthritis, unspecified site: Secondary | ICD-10-CM | POA: Diagnosis not present

## 2021-08-16 DIAGNOSIS — E11610 Type 2 diabetes mellitus with diabetic neuropathic arthropathy: Secondary | ICD-10-CM | POA: Diagnosis not present

## 2021-08-16 DIAGNOSIS — Z7982 Long term (current) use of aspirin: Secondary | ICD-10-CM | POA: Diagnosis not present

## 2021-08-16 DIAGNOSIS — E785 Hyperlipidemia, unspecified: Secondary | ICD-10-CM | POA: Diagnosis not present

## 2021-08-16 NOTE — Assessment & Plan Note (Signed)
Patient has a history of diabetic foot wound complicated by osteomyelitis status post multiple debridements.  Most recently, had debridement on 2/15 and pathology was consistent with osteomyelitis and cultures that grew diphtheroids.  He completed approximately 2 weeks of doxycycline prescribed by his podiatrist.  Patient has had conversations in the past regarding his risk of limb loss but has expressed a desire to do what ever possible to salvage his limb.  Discussed again with patient the difficulty with eradicating this type of infection and his risk of limb loss.   ? ?Will plan for prolonged antibiotic course in the setting of osteomyelitis with residual infected bone after recent debridement.  Unclear of the significance of diphtheroids isolated from his recent culture although likely pathogenic given has been isolated more than once.  However, he likely should have broader antibiotic coverage in the setting of longstanding wound.  Discussed with patient and will plan for daptomycin and cefepime.  Will arrange for PICC line and see OPAT orders below.  Check baseline labs today.  Also discussed with patient necessity of glycemic control, off loading, and continued wound care.  He had ABI's done in October 2022 indicating mild left lower extremity arterial disease.  He has not seen vascular surgery. ? ?Diagnosis: ?Osteomyelitis ? ?Culture Result: Diptheroids ? ?Allergies  ?Allergen Reactions  ?? Codeine Sulfate [Codeine] Nausea Only  ? ? ?OPAT Orders ?Discharge antibiotics to be given via PICC line ?Discharge antibiotics: ?Cefepime 2gm q8h ?Daptomycin 700 mg  q24h ? ?Duration: ?6 weeks from date of PICC line placement ? ? ?[facility] Care Per Protocol: ? ?Home health RN for IV administration and teaching; PICC line care and labs.   ? ?Labs weekly while on IV antibiotics: ?_xx_ CBC with differential ?__ BMP ?_xx_ CMP ?_xx_ CRP ?_xx_ ESR ?__ Vancomycin trough ?_xx_ CK ? ?_xx_ Please pull PIC at completion of IV  antibiotics ?__ Please leave PIC in place until doctor has seen patient or been notified ? ?Fax weekly labs to [phone] ? ?

## 2021-08-16 NOTE — Progress Notes (Signed)
[facility] Infectious Disease  Reason for Consult: Osteomyelitis  Referring Provider: Dr. Blenda Mounts   HPI:    [name] is a 65 y.o. male with PMHx as below who presents to the clinic for osteomyelitis.   Patient has a history of diabetic foot wound complicated by osteomyelitis.  He has undergone multiple surgeries of this wound with podiatry since October 2022.  Most recently, he underwent debridement and irrigation of his wound down to the bone with application of skin graft substitute and wound VAC on 07/28/2021.  He had follow-up with podiatry on 08/02/2021 in which he reported doing well with adequate pain control.  He was noted to have a left medial ankle wound measuring 4.5 cm x 4 cm with granular fibrotic base and some serosanguineous drainage.  There was mild erythema noted.  Pathology from his left talus bone was consistent with osteomyelitis and cultures were positive for diphtheroids.  Patient was prescribed doxycycline 100 mg twice daily which she took for approximately 2 weeks starting 08/02/2021.  He presents today for further evaluation and antibiotic recommendations regarding his left foot infection.  Patient's Medications  New Prescriptions   No medications on file  Previous Medications   ACETAMINOPHEN (TYLENOL) 500 MG TABLET    Take 2 tablets (1,000 mg total) by mouth every 6 (six) hours as needed for mild pain or fever.   ASPIRIN EC 81 MG TABLET    Take 81 mg by mouth daily.   ATORVASTATIN (LIPITOR) 80 MG TABLET    Take 80 mg by mouth daily.   CALCIUM CARBONATE (TUMS - DOSED IN MG ELEMENTAL CALCIUM) 500 MG CHEWABLE TABLET    Chew 1-2 tablets by mouth daily as needed for indigestion or heartburn.   IBUPROFEN (ADVIL) 200 MG TABLET    Take 400 mg by mouth every 8 (eight) hours as needed (pain.).   LANTUS 100 UNIT/ML INJECTION    Inject 28 Units into the skin 2 (two) times daily.   LISINOPRIL (ZESTRIL) 10 MG TABLET    Take 10 mg by mouth in the morning.    METFORMIN (GLUCOPHAGE) 850 MG TABLET    Take 850 mg by mouth in the morning, at noon, and at bedtime.   METOPROLOL SUCCINATE (TOPROL-XL) 50 MG 24 HR TABLET    Take 50 mg by mouth daily. Take with or immediately following a meal.   SITAGLIPTIN (JANUVIA) 100 MG TABLET    Take 100 mg by mouth every morning.    TETRAHYDROZOLINE HCL (VISINE OP)    Place 1 drop into both eyes daily as needed (redness).  Modified Medications   No medications on file  Discontinued Medications   No medications on file      Past Medical History:  Diagnosis Date   Coronary artery disease    Diabetes mellitus without complication (HCC)    Type II   GERD (gastroesophageal reflux disease)    Hypertension    Myocardial infarction ([place])    "Silent"   Neuropathy     Social History   Tobacco Use   Smoking status: Never   Smokeless tobacco: Never  Vaping Use   Vaping Use: Never used  Substance Use Topics   Alcohol use: Never   Drug use: Never    Family History  Problem Relation Age of Onset   Diabetes Mother    Diabetes Father    Heart disease Father    Diabetes Daughter     Allergies  Allergen  Codeine Sulfate [Codeine] Nausea Only  ° ° °Review of Systems  °Constitutional:  Negative for fever.  °Respiratory: Negative.    °Cardiovascular: Negative.   °Gastrointestinal: Negative.   °Skin:   °     + left foot wound.   °All other systems reviewed and are negative. ° ° ° °OBJECTIVE:   ° °Vitals:  ° 08/16/21 1514  °BP: 124/75  °Pulse: 84  °Temp: 97.7 °F (36.5 °C)  °TempSrc: Oral  °   °There is no height or weight on file to calculate BMI. ° °Physical Exam °Constitutional:   °   General: He is not in acute distress. °   Appearance: Normal appearance.  °HENT:  °   Head: Normocephalic and atraumatic.  °Eyes:  °   Extraocular Movements: Extraocular movements intact.  °   Conjunctiva/sclera: Conjunctivae normal.  °Pulmonary:  °   Effort: Pulmonary effort is normal. No respiratory distress.  °Abdominal:  °    General: There is no distension.  °   Palpations: Abdomen is soft.  °Musculoskeletal:  °   Cervical back: Normal range of motion and neck supple.  °Skin: °   General: Skin is warm and dry.  °   Comments: Left lower extremity with wound vac on and in walking CAM boot.   °Neurological:  °   General: No focal deficit present.  °   Mental Status: He is alert and oriented to person, place, and time.  °Psychiatric:     °   Mood and Affect: Mood normal.     °   Behavior: Behavior normal.  ° ° ° °Labs and Microbiology: ° °CBC Latest Ref Rng & Units 07/28/2021 07/02/2021 05/17/2021  °WBC 4.0 - 10.5 K/uL 7.6 8.7 8.1  °Hemoglobin 13.0 - 17.0 g/dL 12.2(L) 12.5(L) 13.2  °Hematocrit 39.0 - 52.0 % 39.3 39.3 41.6  °Platelets 150 - 400 K/uL 230 270 233  ° °CMP Latest Ref Rng & Units 07/28/2021 07/06/2021 07/02/2021  °Glucose 70 - 99 mg/dL 165(H) 83 239(H)  °BUN 8 - 23 mg/dL 28(H) 33(H) 28(H)  °Creatinine 0.61 - 1.24 mg/dL 0.82 0.81 0.87  °Sodium 135 - 145 mmol/L 135 139 134(L)  °Potassium 3.5 - 5.1 mmol/L 4.8 3.9 4.7  °Chloride 98 - 111 mmol/L 105 105 100  °CO2 22 - 32 mmol/L 26 28 27  °Calcium 8.9 - 10.3 mg/dL 8.7(L) 8.6(L) 9.1  °Total Protein 6.5 - 8.1 g/dL - 6.5 -  °Total Bilirubin 0.3 - 1.2 mg/dL - 0.5 -  °Alkaline Phos 38 - 126 U/L - 72 -  °AST 15 - 41 U/L - 19 -  °ALT 0 - 44 U/L - 19 -  °  ° ° °ASSESSMENT & PLAN:   ° °Osteomyelitis (HCC) °Patient has a history of diabetic foot wound complicated by osteomyelitis status post multiple debridements.  Most recently, had debridement on 2/15 and pathology was consistent with osteomyelitis and cultures that grew diphtheroids.  He completed approximately 2 weeks of doxycycline prescribed by his podiatrist.  Patient has had conversations in the past regarding his risk of limb loss but has expressed a desire to do what ever possible to salvage his limb.  Discussed again with patient the difficulty with eradicating this type of infection and his risk of limb loss.   ° °Will plan for prolonged  antibiotic course in the setting of osteomyelitis with residual infected bone after recent debridement.  Unclear of the significance of diphtheroids isolated from his recent culture although likely pathogenic given   likely pathogenic given has been isolated more than once.  However, he likely should have broader antibiotic coverage in the setting of longstanding wound.  Discussed with patient and will plan for daptomycin and cefepime.  Will arrange for PICC line and see OPAT orders below.  Check baseline labs today.  Also discussed with patient necessity of glycemic control, off loading, and continued wound care.  He had ABI's done in October 2022 indicating mild left lower extremity arterial disease.  He has not seen vascular surgery.  Diagnosis: Osteomyelitis  Culture Result: Diptheroids  Allergies  Allergen Reactions   Codeine Sulfate [Codeine] Nausea Only    OPAT Orders Discharge antibiotics to be given via PICC line Discharge antibiotics: Cefepime 2gm q8h Daptomycin 700 mg  q24h  Duration: 6 weeks from date of PICC line placement   PIC Care Per Protocol:  Home health RN for IV administration and teaching; PICC line care and labs.    Labs weekly while on IV antibiotics: _xx_ CBC with differential __ BMP _xx_ CMP _xx_ CRP _xx_ ESR __ Vancomycin trough _xx_ CK  _xx_ Please pull PIC at completion of IV antibiotics __ Please leave PIC in place until doctor has seen patient or been notified  Fax weekly labs to [phone]   Peripheral artery disease ([place]) Patient with long standing left DFU with OM.  ABI's in October noted mild left lower extremity arterial disease.  Will refer to [facility] to see if any further work up and/or intervention needed to help with peripheral blood flow.   Controlled type 2 diabetes mellitus with neuropathy (HCC) A1c is most recently is 8.5.  Emphasized need for glycemic control with patient and he reports working hard to improve this.    Orders Placed This Encounter   Procedures   IR PICC PLACEMENT LEFT >5 YRS INC IMG GUIDE    Standing Status:   Future    Standing Expiration Date:   08/17/2022    Order Specific Question:   Reason for Exam (SYMPTOM  OR DIAGNOSIS REQUIRED)    Answer:   long term abx    Order Specific Question:   Preferred Imaging Location?    Answer:   [facility]   CBC   COMPLETE METABOLIC PANEL WITH GFR   CK (Creatine Kinase)   Sedimentation rate   C-reactive protein   Ambulatory referral to Vascular Surgery    Referral Priority:   Routine    Referral Type:   Surgical    Referral Reason:   Specialty Services Required    Requested Specialty:   Vascular Surgery    Number of Visits Requested:   [facility] 08/16/2021, 3:46 PM

## 2021-08-16 NOTE — Patient Instructions (Signed)
Thank you for coming to see me today. It was a pleasure seeing you. ? ?To Do: ?We will plan for a PICC line to be placed followed by IV antibiotics ?Labs today ?Referral placed for vascular surgery evaluation ?FOllow up with Wound Care as planned ? ?If you have any questions or concerns, please do not hesitate to call the office at [phone]. ? ?Take Care,  ? ?Jule Ser ? ?

## 2021-08-16 NOTE — Assessment & Plan Note (Signed)
Patient with long standing left DFU with OM.  ABI's in October noted mild left lower extremity arterial disease.  Will refer to VVS to see if any further work up and/or intervention needed to help with peripheral blood flow.  ?

## 2021-08-16 NOTE — Assessment & Plan Note (Signed)
A1c is most recently is 8.5.  Emphasized need for glycemic control with patient and he reports working hard to improve this.  ?

## 2021-08-17 ENCOUNTER — Telehealth: Payer: Self-pay

## 2021-08-17 DIAGNOSIS — T8131XA Disruption of external operation (surgical) wound, not elsewhere classified, initial encounter: Secondary | ICD-10-CM | POA: Diagnosis not present

## 2021-08-17 LAB — COMPLETE METABOLIC PANEL WITH GFR
AG Ratio: 1.5 (calc) (ref 1.0–2.5)
ALT: 16 U/L (ref 9–46)
AST: 12 U/L (ref 10–35)
Albumin: 3.7 g/dL (ref 3.6–5.1)
Alkaline phosphatase (APISO): 93 U/L (ref 35–144)
BUN/Creatinine Ratio: 41 (calc) — ABNORMAL HIGH (ref 6–22)
BUN: 40 mg/dL — ABNORMAL HIGH (ref 7–25)
CO2: 24 mmol/L (ref 20–32)
Calcium: 9.1 mg/dL (ref 8.6–10.3)
Chloride: 101 mmol/L (ref 98–110)
Creat: 0.97 mg/dL (ref 0.70–1.35)
Globulin: 2.5 g/dL (calc) (ref 1.9–3.7)
Glucose, Bld: 236 mg/dL — ABNORMAL HIGH (ref 65–99)
Potassium: 5.3 mmol/L (ref 3.5–5.3)
Sodium: 138 mmol/L (ref 135–146)
Total Bilirubin: 0.4 mg/dL (ref 0.2–1.2)
Total Protein: 6.2 g/dL (ref 6.1–8.1)
eGFR: 87 mL/min/{1.73_m2} (ref >=60)

## 2021-08-17 LAB — CBC
HCT: 38.3 % — ABNORMAL LOW (ref 38.5–50.0)
Hemoglobin: 12.4 g/dL — ABNORMAL LOW (ref 13.2–17.1)
MCH: 29 pg (ref 27.0–33.0)
MCHC: 32.4 g/dL (ref 32.0–36.0)
MCV: 89.5 fL (ref 80.0–100.0)
MPV: 11.4 fL (ref 7.5–12.5)
Platelets: 263 10*3/uL (ref 140–400)
RBC: 4.28 10*6/uL (ref 4.20–5.80)
RDW: 12.7 % (ref 11.0–15.0)
WBC: 8.6 10*3/uL (ref 3.8–10.8)

## 2021-08-17 LAB — C-REACTIVE PROTEIN: CRP: 15.5 mg/L — ABNORMAL HIGH (ref <8.0)

## 2021-08-17 LAB — SEDIMENTATION RATE: Sed Rate: 36 mm/h — ABNORMAL HIGH (ref 0–20)

## 2021-08-17 LAB — CK: Total CK: 37 U/L — ABNORMAL LOW (ref 44–196)

## 2021-08-17 NOTE — Telephone Encounter (Signed)
Talked to pt to remind him and his wife of picc line placement appt on 08/18/21 @ 9am at [facility]. I also notified them of first dose of daptomycin to be administered at home by the home health nurse from [facility] on 08/18/21 between 2-3 pm. Pt verbalized understanding. ? ?Adelfa Koh, CMA ? ?

## 2021-08-17 NOTE — Telephone Encounter (Signed)
Spoke with patient, relayed that ESR and CRP are elevated as expected and that this does not change the antibiotic plan. Patient verbalized understanding and has no further questions.  ? ?Call transferred to Annie Main, [place] to discuss PICC placement and at-home first dose.  ? ?Beryle Flock, RN ? ?

## 2021-08-17 NOTE — Telephone Encounter (Signed)
-----  Message from Mignon Pine, DO sent at 08/17/2021  9:24 AM EST ----- ?Please let patient know that labs yesterday were as anticipated with osteomyelitis and no changes to antibiotic plan discussed.  ESR/CRP are elevated and will be monitored with treatment. ? ?Thanks ?

## 2021-08-17 NOTE — Telephone Encounter (Signed)
Thanks Sondra Come added him to our OPAT monitoring list.

## 2021-08-18 ENCOUNTER — Other Ambulatory Visit: Payer: Self-pay

## 2021-08-18 ENCOUNTER — Ambulatory Visit (HOSPITAL_COMMUNITY)
Admission: RE | Admit: 2021-08-18 | Discharge: 2021-08-18 | Disposition: A | Discharge status: Home / Self Care | Payer: BC Managed Care – PPO | Source: Ambulatory Visit | Attending: Internal Medicine | Admitting: Internal Medicine

## 2021-08-18 DIAGNOSIS — E1140 Type 2 diabetes mellitus with diabetic neuropathy, unspecified: Secondary | ICD-10-CM | POA: Diagnosis not present

## 2021-08-18 DIAGNOSIS — M869 Osteomyelitis, unspecified: Secondary | ICD-10-CM | POA: Insufficient documentation

## 2021-08-18 DIAGNOSIS — Z951 Presence of aortocoronary bypass graft: Secondary | ICD-10-CM | POA: Diagnosis not present

## 2021-08-18 DIAGNOSIS — M1990 Unspecified osteoarthritis, unspecified site: Secondary | ICD-10-CM | POA: Diagnosis not present

## 2021-08-18 DIAGNOSIS — Z9181 History of falling: Secondary | ICD-10-CM | POA: Diagnosis not present

## 2021-08-18 DIAGNOSIS — Z7984 Long term (current) use of oral hypoglycemic drugs: Secondary | ICD-10-CM | POA: Diagnosis not present

## 2021-08-18 DIAGNOSIS — Z452 Encounter for adjustment and management of vascular access device: Secondary | ICD-10-CM | POA: Diagnosis not present

## 2021-08-18 DIAGNOSIS — E785 Hyperlipidemia, unspecified: Secondary | ICD-10-CM | POA: Diagnosis not present

## 2021-08-18 DIAGNOSIS — T8131XA Disruption of external operation (surgical) wound, not elsewhere classified, initial encounter: Secondary | ICD-10-CM | POA: Diagnosis not present

## 2021-08-18 DIAGNOSIS — I10 Essential (primary) hypertension: Secondary | ICD-10-CM | POA: Diagnosis not present

## 2021-08-18 DIAGNOSIS — D649 Anemia, unspecified: Secondary | ICD-10-CM | POA: Diagnosis not present

## 2021-08-18 DIAGNOSIS — E11621 Type 2 diabetes mellitus with foot ulcer: Secondary | ICD-10-CM | POA: Diagnosis not present

## 2021-08-18 DIAGNOSIS — Z7982 Long term (current) use of aspirin: Secondary | ICD-10-CM | POA: Diagnosis not present

## 2021-08-18 DIAGNOSIS — K219 Gastro-esophageal reflux disease without esophagitis: Secondary | ICD-10-CM | POA: Diagnosis not present

## 2021-08-18 DIAGNOSIS — I2510 Atherosclerotic heart disease of native coronary artery without angina pectoris: Secondary | ICD-10-CM | POA: Diagnosis not present

## 2021-08-18 DIAGNOSIS — Z794 Long term (current) use of insulin: Secondary | ICD-10-CM | POA: Diagnosis not present

## 2021-08-18 DIAGNOSIS — E11610 Type 2 diabetes mellitus with diabetic neuropathic arthropathy: Secondary | ICD-10-CM | POA: Diagnosis not present

## 2021-08-18 DIAGNOSIS — L97522 Non-pressure chronic ulcer of other part of left foot with fat layer exposed: Secondary | ICD-10-CM | POA: Diagnosis not present

## 2021-08-18 DIAGNOSIS — I252 Old myocardial infarction: Secondary | ICD-10-CM | POA: Diagnosis not present

## 2021-08-18 DIAGNOSIS — I739 Peripheral vascular disease, unspecified: Secondary | ICD-10-CM | POA: Diagnosis not present

## 2021-08-18 MED ORDER — LIDOCAINE HCL 1 % IJ SOLN
INTRAMUSCULAR | Status: AC
  Administered 2021-08-18: 10 mL
  Filled 2021-08-18: qty 20

## 2021-08-18 MED ORDER — HEPARIN SOD (PORK) LOCK FLUSH 100 UNIT/ML IV SOLN
INTRAVENOUS | Status: AC
  Filled 2021-08-18: qty 5

## 2021-08-18 NOTE — Procedures (Signed)
PROCEDURE SUMMARY: ? ?Successful placement of image-guided single lumen PICC line to the right basilic vein. ?Length 39 cm. ?Tip at lower SVC/RA. ?No complications. ?EBL = < 2 ml. ?Ready for use. ? ?Please see imaging section of Epic for full dictation. ? ? ?Mickie Kay, NP ?08/18/2021 ?9:33 AM ? ? ? ?

## 2021-08-19 ENCOUNTER — Encounter: Payer: BC Managed Care – PPO | Admitting: Podiatry

## 2021-08-19 ENCOUNTER — Telehealth: Payer: Self-pay | Admitting: Pharmacist

## 2021-08-19 DIAGNOSIS — I739 Peripheral vascular disease, unspecified: Secondary | ICD-10-CM | POA: Diagnosis not present

## 2021-08-19 DIAGNOSIS — T8131XA Disruption of external operation (surgical) wound, not elsewhere classified, initial encounter: Secondary | ICD-10-CM | POA: Diagnosis not present

## 2021-08-19 NOTE — Telephone Encounter (Signed)
Patient's daughter requesting information on [name]'s need for probiotics while on IV antibiotics (daptomycin and cefepime for osteo through 4/19). I stated that the data is conflicting on the benefits of probiotics preventing C diff for patients on antibiotics. Reviewed that they would be relatively benign for him aside for the cost of the medications. Counseled that I would not recommend taking probiotics due to lack of data supporting their benefit as well as their elevated costs. Reviewed that daptomycin and cefepime are relatively low risk for CDI. Also encouraged them to call us if he experiences 3-5 episodes of watery stools within a 24-hour period. Discussed that it is normal to experience mild diarrhea or looser stools when first starting these medications. ? ?[name], PharmD, CPP ?Clinical Pharmacist Practitioner ?Infectious Diseases Clinical Pharmacist ?[facility] ? ?

## 2021-08-20 DIAGNOSIS — L97522 Non-pressure chronic ulcer of other part of left foot with fat layer exposed: Secondary | ICD-10-CM | POA: Diagnosis not present

## 2021-08-20 DIAGNOSIS — E11610 Type 2 diabetes mellitus with diabetic neuropathic arthropathy: Secondary | ICD-10-CM | POA: Diagnosis not present

## 2021-08-20 DIAGNOSIS — E785 Hyperlipidemia, unspecified: Secondary | ICD-10-CM | POA: Diagnosis not present

## 2021-08-20 DIAGNOSIS — I739 Peripheral vascular disease, unspecified: Secondary | ICD-10-CM | POA: Diagnosis not present

## 2021-08-20 DIAGNOSIS — Z7984 Long term (current) use of oral hypoglycemic drugs: Secondary | ICD-10-CM | POA: Diagnosis not present

## 2021-08-20 DIAGNOSIS — I10 Essential (primary) hypertension: Secondary | ICD-10-CM | POA: Diagnosis not present

## 2021-08-20 DIAGNOSIS — K219 Gastro-esophageal reflux disease without esophagitis: Secondary | ICD-10-CM | POA: Diagnosis not present

## 2021-08-20 DIAGNOSIS — I252 Old myocardial infarction: Secondary | ICD-10-CM | POA: Diagnosis not present

## 2021-08-20 DIAGNOSIS — E11621 Type 2 diabetes mellitus with foot ulcer: Secondary | ICD-10-CM | POA: Diagnosis not present

## 2021-08-20 DIAGNOSIS — Z7982 Long term (current) use of aspirin: Secondary | ICD-10-CM | POA: Diagnosis not present

## 2021-08-20 DIAGNOSIS — Z9181 History of falling: Secondary | ICD-10-CM | POA: Diagnosis not present

## 2021-08-20 DIAGNOSIS — Z951 Presence of aortocoronary bypass graft: Secondary | ICD-10-CM | POA: Diagnosis not present

## 2021-08-20 DIAGNOSIS — M1990 Unspecified osteoarthritis, unspecified site: Secondary | ICD-10-CM | POA: Diagnosis not present

## 2021-08-20 DIAGNOSIS — D649 Anemia, unspecified: Secondary | ICD-10-CM | POA: Diagnosis not present

## 2021-08-20 DIAGNOSIS — T8131XA Disruption of external operation (surgical) wound, not elsewhere classified, initial encounter: Secondary | ICD-10-CM | POA: Diagnosis not present

## 2021-08-20 DIAGNOSIS — E1140 Type 2 diabetes mellitus with diabetic neuropathy, unspecified: Secondary | ICD-10-CM | POA: Diagnosis not present

## 2021-08-20 DIAGNOSIS — Z794 Long term (current) use of insulin: Secondary | ICD-10-CM | POA: Diagnosis not present

## 2021-08-20 DIAGNOSIS — I2510 Atherosclerotic heart disease of native coronary artery without angina pectoris: Secondary | ICD-10-CM | POA: Diagnosis not present

## 2021-08-21 DIAGNOSIS — I739 Peripheral vascular disease, unspecified: Secondary | ICD-10-CM | POA: Diagnosis not present

## 2021-08-21 DIAGNOSIS — T8131XA Disruption of external operation (surgical) wound, not elsewhere classified, initial encounter: Secondary | ICD-10-CM | POA: Diagnosis not present

## 2021-08-22 DIAGNOSIS — T8131XA Disruption of external operation (surgical) wound, not elsewhere classified, initial encounter: Secondary | ICD-10-CM | POA: Diagnosis not present

## 2021-08-22 DIAGNOSIS — I739 Peripheral vascular disease, unspecified: Secondary | ICD-10-CM | POA: Diagnosis not present

## 2021-08-23 ENCOUNTER — Ambulatory Visit (INDEPENDENT_AMBULATORY_CARE_PROVIDER_SITE_OTHER): Payer: BC Managed Care – PPO | Admitting: Podiatry

## 2021-08-23 ENCOUNTER — Other Ambulatory Visit: Payer: Self-pay

## 2021-08-23 DIAGNOSIS — K219 Gastro-esophageal reflux disease without esophagitis: Secondary | ICD-10-CM | POA: Diagnosis not present

## 2021-08-23 DIAGNOSIS — E785 Hyperlipidemia, unspecified: Secondary | ICD-10-CM | POA: Diagnosis not present

## 2021-08-23 DIAGNOSIS — L97509 Non-pressure chronic ulcer of other part of unspecified foot with unspecified severity: Secondary | ICD-10-CM | POA: Diagnosis not present

## 2021-08-23 DIAGNOSIS — E11610 Type 2 diabetes mellitus with diabetic neuropathic arthropathy: Secondary | ICD-10-CM | POA: Diagnosis not present

## 2021-08-23 DIAGNOSIS — D649 Anemia, unspecified: Secondary | ICD-10-CM | POA: Diagnosis not present

## 2021-08-23 DIAGNOSIS — L97424 Non-pressure chronic ulcer of left heel and midfoot with necrosis of bone: Secondary | ICD-10-CM

## 2021-08-23 DIAGNOSIS — Z794 Long term (current) use of insulin: Secondary | ICD-10-CM | POA: Diagnosis not present

## 2021-08-23 DIAGNOSIS — E11621 Type 2 diabetes mellitus with foot ulcer: Secondary | ICD-10-CM | POA: Diagnosis not present

## 2021-08-23 DIAGNOSIS — E1140 Type 2 diabetes mellitus with diabetic neuropathy, unspecified: Secondary | ICD-10-CM | POA: Diagnosis not present

## 2021-08-23 DIAGNOSIS — Z9181 History of falling: Secondary | ICD-10-CM | POA: Diagnosis not present

## 2021-08-23 DIAGNOSIS — E1169 Type 2 diabetes mellitus with other specified complication: Secondary | ICD-10-CM | POA: Diagnosis not present

## 2021-08-23 DIAGNOSIS — T8131XA Disruption of external operation (surgical) wound, not elsewhere classified, initial encounter: Secondary | ICD-10-CM | POA: Diagnosis not present

## 2021-08-23 DIAGNOSIS — Z7982 Long term (current) use of aspirin: Secondary | ICD-10-CM | POA: Diagnosis not present

## 2021-08-23 DIAGNOSIS — Z7984 Long term (current) use of oral hypoglycemic drugs: Secondary | ICD-10-CM | POA: Diagnosis not present

## 2021-08-23 DIAGNOSIS — M1990 Unspecified osteoarthritis, unspecified site: Secondary | ICD-10-CM | POA: Diagnosis not present

## 2021-08-23 DIAGNOSIS — L97522 Non-pressure chronic ulcer of other part of left foot with fat layer exposed: Secondary | ICD-10-CM | POA: Diagnosis not present

## 2021-08-23 DIAGNOSIS — M869 Osteomyelitis, unspecified: Secondary | ICD-10-CM | POA: Diagnosis not present

## 2021-08-23 DIAGNOSIS — I10 Essential (primary) hypertension: Secondary | ICD-10-CM | POA: Diagnosis not present

## 2021-08-23 DIAGNOSIS — I2510 Atherosclerotic heart disease of native coronary artery without angina pectoris: Secondary | ICD-10-CM | POA: Diagnosis not present

## 2021-08-23 DIAGNOSIS — I739 Peripheral vascular disease, unspecified: Secondary | ICD-10-CM | POA: Diagnosis not present

## 2021-08-23 DIAGNOSIS — Z792 Long term (current) use of antibiotics: Secondary | ICD-10-CM | POA: Diagnosis not present

## 2021-08-23 DIAGNOSIS — Z951 Presence of aortocoronary bypass graft: Secondary | ICD-10-CM | POA: Diagnosis not present

## 2021-08-23 DIAGNOSIS — I252 Old myocardial infarction: Secondary | ICD-10-CM | POA: Diagnosis not present

## 2021-08-24 DIAGNOSIS — T8131XA Disruption of external operation (surgical) wound, not elsewhere classified, initial encounter: Secondary | ICD-10-CM | POA: Diagnosis not present

## 2021-08-24 DIAGNOSIS — I739 Peripheral vascular disease, unspecified: Secondary | ICD-10-CM | POA: Diagnosis not present

## 2021-08-25 ENCOUNTER — Other Ambulatory Visit: Payer: Self-pay

## 2021-08-25 ENCOUNTER — Encounter (HOSPITAL_BASED_OUTPATIENT_CLINIC_OR_DEPARTMENT_OTHER): Payer: BC Managed Care – PPO | Attending: Internal Medicine | Admitting: General Surgery

## 2021-08-25 DIAGNOSIS — E1151 Type 2 diabetes mellitus with diabetic peripheral angiopathy without gangrene: Secondary | ICD-10-CM | POA: Insufficient documentation

## 2021-08-25 DIAGNOSIS — E11621 Type 2 diabetes mellitus with foot ulcer: Secondary | ICD-10-CM | POA: Diagnosis not present

## 2021-08-25 DIAGNOSIS — E11610 Type 2 diabetes mellitus with diabetic neuropathic arthropathy: Secondary | ICD-10-CM | POA: Insufficient documentation

## 2021-08-25 DIAGNOSIS — E7800 Pure hypercholesterolemia, unspecified: Secondary | ICD-10-CM | POA: Insufficient documentation

## 2021-08-25 DIAGNOSIS — I2510 Atherosclerotic heart disease of native coronary artery without angina pectoris: Secondary | ICD-10-CM | POA: Insufficient documentation

## 2021-08-25 DIAGNOSIS — M86672 Other chronic osteomyelitis, left ankle and foot: Secondary | ICD-10-CM | POA: Insufficient documentation

## 2021-08-25 DIAGNOSIS — Z951 Presence of aortocoronary bypass graft: Secondary | ICD-10-CM | POA: Insufficient documentation

## 2021-08-25 DIAGNOSIS — I739 Peripheral vascular disease, unspecified: Secondary | ICD-10-CM | POA: Diagnosis not present

## 2021-08-25 DIAGNOSIS — E1140 Type 2 diabetes mellitus with diabetic neuropathy, unspecified: Secondary | ICD-10-CM | POA: Insufficient documentation

## 2021-08-25 DIAGNOSIS — K219 Gastro-esophageal reflux disease without esophagitis: Secondary | ICD-10-CM | POA: Insufficient documentation

## 2021-08-25 DIAGNOSIS — T8131XA Disruption of external operation (surgical) wound, not elsewhere classified, initial encounter: Secondary | ICD-10-CM | POA: Diagnosis not present

## 2021-08-25 NOTE — Progress Notes (Signed)
TAMIR, WALLMAN A. ([id number]) ?Visit Report for 08/25/2021 ?Abuse Risk Screen Details ?Patient Name: Date of Service: ?NA Colin Nephew A. 08/25/2021 1:15 PM ?Medical Record Number: [id number] ?Patient Account Number: [id number] ?Date of Birth/Sex: Treating RN: ?[birth year] (66 y.o. [name] ?Primary Care Ruari Duggan: [name] Other Clinician: ?Referring Jadalee Westcott: ?Treating Renella Steig/Extender: [name] ?SIKO RA, REBECCA ?Weeks in Treatment: 0 ?Abuse Risk Screen Items ?Answer ?ABUSE RISK SCREEN: ?Has anyone close to you tried to hurt or harm you recentlyo No ?Do you feel uncomfortable with anyone in your familyo No ?Has anyone forced you do things that you didnt want to doo No ?Electronic Signature(s) ?Signed: 08/25/2021 5:05:04 PM By: Antonieta Iba ?Entered By: Antonieta Iba on 08/25/2021 13:52:32 ?-------------------------------------------------------------------------------- ?Activities of Daily Living Details ?Patient Name: Date of Service: ?NA Colin Nephew A. 08/25/2021 1:15 PM ?Medical Record Number: [id number] ?Patient Account Number: [id number] ?Date of Birth/Sex: Treating RN: ?April 23, 1956 (66 y.o. [name] ?Primary Care Pyper Olexa: [name] Other Clinician: ?Referring Jenson Beedle: ?Treating Kadra Kohan/Extender: [name] ?SIKO RA, REBECCA ?Weeks in Treatment: 0 ?Activities of Daily Living Items ?Answer ?Activities of Daily Living (Please select one for each item) ?Drive Automobile Completely Able ?T Medications ?ake Completely Able ?Use T elephone Completely Able ?Care for Appearance Completely Able ?Use T oilet Completely Able ?Bath / Shower Completely Able ?Dress Self Completely Able ?Feed Self Completely Able ?Walk Completely Able ?Get In / Out Bed Completely Able ?Housework Completely Able ?Prepare Meals Completely Able ?Handle Money Completely Able ?Shop for Self Completely Able ?Electronic Signature(s) ?Signed: 08/25/2021 5:05:04 PM By: Antonieta Iba ?Entered By: Antonieta Iba on  08/25/2021 13:53:00 ?-------------------------------------------------------------------------------- ?Education Screening Details ?Patient Name: ?Date of Service: ?NA Colin Nephew A. 08/25/2021 1:15 PM ?Medical Record Number: [id number] ?Patient Account Number: [id number] ?Date of Birth/Sex: ?Treating RN: ?1955-08-20 (66 y.o. [name] ?Primary Care Belinda Schlichting: [name] ?Other Clinician: ?Referring Emmalyn Hinson: ?Treating Brittnie Lewey/Extender: [name] ?SIKO RA, REBECCA ?Weeks in Treatment: 0 ?Primary Learner Assessed: Patient ?Learning Preferences/Education Level/Primary Language ?Learning Preference: Explanation, Demonstration, Printed Material ?Highest Education Level: College or Above ?Preferred Language: English ?Cognitive Barrier ?Language Barrier: No ?Translator Needed: No ?Memory Deficit: No ?Emotional Barrier: No ?Cultural/Religious Beliefs Affecting Medical Care: No ?Physical Barrier ?Impaired Vision: Yes Glasses, Reading ?Impaired Hearing: No ?Decreased Hand dexterity: No ?Knowledge/Comprehension ?Knowledge Level: High ?Comprehension Level: High ?Ability to understand written instructions: High ?Ability to understand verbal instructions: High ?Motivation ?Anxiety Level: Calm ?Cooperation: Cooperative ?Education Importance: Acknowledges Need ?Interest in Health Problems: Asks Questions ?Perception: Coherent ?Willingness to Engage in Self-Management High ?Activities: ?Readiness to Engage in Self-Management High ?Activities: ?Electronic Signature(s) ?Signed: 08/25/2021 5:05:04 PM By: Antonieta Iba ?Entered By: Antonieta Iba on 08/25/2021 13:53:37 ?-------------------------------------------------------------------------------- ?Fall Risk Assessment Details ?Patient Name: ?Date of Service: ?NA Colin Nephew A. 08/25/2021 1:15 PM ?Medical Record Number: [id number] ?Patient Account Number: [id number] ?Date of Birth/Sex: ?Treating RN: ?1956/03/07 (66 y.o. [name] ?Primary Care Alfred Harrel: [name] ?Other Clinician: ?Referring Clevie Prout: ?Treating Amir Fick/Extender: [name] ?SIKO RA, REBECCA ?Weeks in Treatment: 0 ?Fall Risk Assessment Items ?Have you had 2 or more falls in the last 12 monthso 0 No ?Have you had any fall that resulted in injury in the last 12 monthso 0 No ?FALLS RISK SCREEN ?History of falling - immediate or within 3 months 0 No ?Secondary diagnosis (Do you have 2 or more medical diagnoseso) 15 Yes ?Ambulatory aid ?None/bed rest/wheelchair/nurse 0 No ?Crutches/cane/walker 15 Yes ?Furniture 0 No ?Intravenous therapy Access/Saline/Heparin Lock 0 No ?Gait/Transferring ?Normal/  bed rest/ wheelchair 0 Yes ?Weak (short steps with or without shuffle, stooped but able to lift head while walking, may seek 0 No ?support from furniture) ?Impaired (short steps with shuffle, may have difficulty arising from chair, head down, impaired 0 No ?balance) ?Mental Status ?Oriented to own ability 0 Yes ?Electronic Signature(s) ?Signed: 08/25/2021 5:05:04 PM By: Antonieta Iba ?Entered By: Antonieta Iba on 08/25/2021 13:53:58 ?-------------------------------------------------------------------------------- ?Foot Assessment Details ?Patient Name: ?Date of Service: ?NA Colin Nephew A. 08/25/2021 1:15 PM ?Medical Record Number: [id number] ?Patient Account Number: [id number] ?Date of Birth/Sex: ?Treating RN: ?1955-12-04 (66 y.o. [name] ?Primary Care Kohl Polinsky: [name] ?Other Clinician: ?Referring Zahi Plaskett: ?Treating Jomes Giraldo/Extender: [name] ?SIKO RA, REBECCA ?Weeks in Treatment: 0 ?Foot Assessment Items ?Site Locations ?+ = Sensation present, - = Sensation absent, C = Callus, U = Ulcer ?R = Redness, W = Warmth, M = Maceration, PU = Pre-ulcerative lesion ?F = Fissure, S = Swelling, D = Dryness ?Assessment ?Right: Left: ?Other Deformity: No No ?Prior Foot Ulcer: No No ?Prior Amputation: No No ?Charcot Joint: No Yes ?Ambulatory Status: Ambulatory With Help ?Assistance Device:  Walker ?Gait: Steady ?Electronic Signature(s) ?Signed: 08/25/2021 5:05:04 PM By: Antonieta Iba ?Entered By: Antonieta Iba on 08/25/2021 14:02:09 ?-------------------------------------------------------------------------------- ?Nutrition Risk Screening Details ?Patient Name: ?Date of Service: ?NA Colin Nephew A. 08/25/2021 1:15 PM ?Medical Record Number: [id number] ?Patient Account Number: [id number] ?Date of Birth/Sex: ?Treating RN: ?May 08, 1956 (66 y.o. [name] ?Primary Care Yoshito Gaza: [name] ?Other Clinician: ?Referring Dylynn Ketner: ?Treating [name]/Extender: [name] ?SIKO RA, REBECCA ?Weeks in Treatment: 0 ?Height (in): 74 ?Weight (lbs): 186 ?Body Mass Index (BMI): 23.9 ?Nutrition Risk Screening Items ?Score Screening ?NUTRITION RISK SCREEN: ?I have an illness or condition that made me change the kind and/or amount of food I eat 0 No ?I eat fewer than two meals per day 0 No ?I eat few fruits and vegetables, or milk products 0 No ?I have three or more drinks of beer, liquor or wine almost every day 0 No ?I have tooth or mouth problems that make it hard for me to eat 0 No ?I don't always have enough money to buy the food I need 0 No ?I eat alone most of the time 0 No ?I take three or more different prescribed or over-the-counter drugs a day 1 Yes ?Without wanting to, I have lost or gained 10 pounds in the last six months 0 No ?I am not always physically able to shop, cook and/or feed myself 0 No ?Nutrition Protocols ?Good Risk Protocol 0 No interventions needed ?Moderate Risk Protocol ?High Risk Proctocol ?Risk Level: Good Risk ?Score: 1 ?Electronic Signature(s) ?Signed: 08/25/2021 5:05:04 PM By: Antonieta Iba ?Entered By: Antonieta Iba on 08/25/2021 13:54:36 ?

## 2021-08-25 NOTE — Progress Notes (Signed)
[name], [name] ([id number]) ?Visit Report for 08/25/2021 ?Allergy List Details ?Patient Name: Date of Service: ?NA Colin Nephew A. 08/25/2021 1:15 PM ?Medical Record Number: [id number] ?Patient Account Number: [id number] ?Date of Birth/Sex: Treating RN: ?[birth year] (66 y.o. [name] ?Primary Care Tery Hoeger: [name] Other Clinician: ?Referring Kay Shippy: ?Treating Larrisha Babineau/Extender: [name] ?SIKO RA, REBECCA ?Weeks in Treatment: 0 ?Allergies ?Active Allergies ?codeine ?Reaction: N/V ?Allergy Notes ?Electronic Signature(s) ?Signed: 08/25/2021 5:05:04 PM By: Antonieta Iba ?Entered By: Antonieta Iba on 08/25/2021 13:48:09 ?-------------------------------------------------------------------------------- ?Arrival Information Details ?Patient Name: Date of Service: ?NA Colin Nephew A. 08/25/2021 1:15 PM ?Medical Record Number: [id number] ?Patient Account Number: [id number] ?Date of Birth/Sex: Treating RN: ?09/14/1955 (66 y.o. [name] ?Primary Care Delisa Finck: [name] Other Clinician: ?Referring Brylei Pedley: ?Treating Diyan Dave/Extender: [name] ?SIKO RA, REBECCA ?Weeks in Treatment: 0 ?Visit Information ?Patient Arrived: Knee Scooter ?Arrival Time: 13:35 ?Transfer Assistance: None ?Patient Identification Verified: Yes ?Secondary Verification Process Completed: Yes ?Patient Requires Transmission-Based Precautions: No ?Patient Has Alerts: Yes ?Patient Alerts: Patient on Blood Thinner ?PICC Line Right Arm ?R ABI=1.1 TBI=.55 ?L ABI=.86 TBI=.36 ?Electronic Signature(s) ?Signed: 08/25/2021 5:05:04 PM By: Antonieta Iba ?Entered By: Antonieta Iba on 08/25/2021 14:18:14 ?-------------------------------------------------------------------------------- ?Clinic Level of Care Assessment Details ?Patient Name: Date of Service: ?NA Colin Nephew A. 08/25/2021 1:15 PM ?Medical Record Number: [id number] ?Patient Account Number: [id number] ?Date of Birth/Sex: Treating RN: ?1955-11-07 (66 y.o. [name] ?Primary Care Jerald Hennington: [name] Other Clinician: ?Referring Ever Gustafson: ?Treating Lavere Shinsky/Extender: [name] ?SIKO RA, REBECCA ?Weeks in Treatment: 0 ?Clinic Level of Care Assessment Items ?TOOL 1 Quantity Score ?X- 1 0 ?Use when EandM and Procedure is performed on INITIAL visit ?ASSESSMENTS - Nursing Assessment / Reassessment ?X- 1 20 ?General Physical Exam (combine w/ comprehensive assessment (listed just below) when performed on new pt. evals) ?X- 1 25 ?Comprehensive Assessment (HX, ROS, Risk Assessments, Wounds Hx, etc.) ?ASSESSMENTS - Wound and Skin Assessment / Reassessment ?[]  - 0 ?Dermatologic / Skin Assessment (not related to wound area) ?ASSESSMENTS - Ostomy and/or Continence Assessment and Care ?[]  - 0 ?Incontinence Assessment and Management ?[]  - 0 ?Ostomy Care Assessment and Management (repouching, etc.) ?PROCESS - Coordination of Care ?[]  - 0 ?Simple Patient / Family Education for ongoing care ?X- 1 20 ?Complex (extensive) Patient / Family Education for ongoing care ?X- 1 10 ?Staff obtains Consents, Records, T Results / Process Orders ?est ?X- 1 10 ?Staff telephones HHA, Nursing Homes / Clarify orders / etc ?[]  - 0 ?Routine Transfer to another Facility (non-emergent condition) ?[]  - 0 ?Routine Hospital Admission (non-emergent condition) ?[]  - 0 ?New Admissions / / Ordering NPWT Apligraf, etc. ?, ?[]  - 0 ?Emergency Hospital Admission (emergent condition) ?PROCESS - Special Needs ?[]  - 0 ?Pediatric / Minor Patient Management ?[]  - 0 ?Isolation Patient Management ?[]  - 0 ?Hearing / Language / Visual special needs ?[]  - 0 ?Assessment of Community assistance (transportation, D/C planning, etc.) ?[]  - 0 ?Additional assistance / Altered mentation ?[]  - 0 ?Support Surface(s) Assessment (bed, cushion, seat, etc.) ?INTERVENTIONS - Miscellaneous ?[]  - 0 ?External ear exam ?[]  - 0 ?Patient Transfer (multiple staff / / Similar devices) ?[]  - 0 ?Simple Staple /  Suture removal (25 or less) ?[]  - 0 ?Complex Staple / Suture removal (26 or more) ?[]  - 0 ?Hypo/Hyperglycemic Management (do not check if billed separately) ?[]  - 0 ?Ankle / Brachial Index (ABI) - do not check if billed separately ?Has the patient been seen  at the hospital within the last three years: Yes ?Total Score: 85 ?Level Of Care: New/Established - Level 3 ?Electronic Signature(s) ?Signed: 08/25/2021 5:05:04 PM By: Antonieta Iba ?Signed: 08/25/2021 5:05:04 PM By: Antonieta Iba ?Entered By: Antonieta Iba on 08/25/2021 15:01:13 ?-------------------------------------------------------------------------------- ?Lower Extremity Assessment Details ?Patient Name: ?Date of Service: ?NA Colin Nephew A. 08/25/2021 1:15 PM ?Medical Record Number: [id number] ?Patient Account Number: [id number] ?Date of Birth/Sex: ?Treating RN: ?September 30, 1955 (66 y.o. [name] ?Primary Care Zakary Kimura: [name] ?Other Clinician: ?Referring Verley Pariseau: ?Treating Elah Avellino/Extender: [name] ?SIKO RA, REBECCA ?Weeks in Treatment: 0 ?Edema Assessment ?Assessed: [Left: Yes] [Right: No] ?E[Left: dema] [Right: :] ?Calf ?Left: Right: ?Point of Measurement: From Medial Instep 36 cm ?Ankle ?Left: Right: ?Point of Measurement: From Medial Instep 21.5 cm ?Vascular Assessment ?Pulses: ?Dorsalis Pedis ?Palpable: [Left:Yes] ?Electronic Signature(s) ?Signed: 08/25/2021 5:05:04 PM By: Antonieta Iba ?Entered By: Antonieta Iba on 08/25/2021 14:04:39 ?-------------------------------------------------------------------------------- ?Multi-Disciplinary Care Plan Details ?Patient Name: ?Date of Service: ?NA Colin Nephew A. 08/25/2021 1:15 PM ?Medical Record Number: [id number] ?Patient Account Number: [id number] ?Date of Birth/Sex: ?Treating RN: ?04-Jan-1956 (66 y.o. [name] ?Primary Care Chrisette Man: [name] ?Other Clinician: ?Referring Alp Goldwater: ?Treating Cleotha Tsang/Extender: [name] ?SIKO RA, REBECCA ?Weeks in Treatment: 0 ?Active  Inactive ?Nutrition ?Nursing Diagnoses: ?Impaired glucose control: actual or potential ?Goals: ?Patient/caregiver verbalizes understanding of need to maintain therapeutic glucose control per primary care physician ?Date Initiated: 08/25/2021 ?Target Resolution Date: 09/22/2021 ?Goal Status: Active ?Interventions: ?Assess HgA1c results as ordered upon admission and as needed ?Provide education on elevated blood sugars and impact on wound healing ?Notes: ?Wound/Skin Impairment ?Nursing Diagnoses: ?Impaired tissue integrity ?Goals: ?Patient/caregiver will verbalize understanding of skin care regimen ?Date Initiated: 08/25/2021 ?Target Resolution Date: 09/22/2021 ?Goal Status: Active ?Ulcer/skin breakdown will have a volume reduction of 30% by week 4 ?Date Initiated: 08/25/2021 ?Target Resolution Date: 09/22/2021 ?Goal Status: Active ?Interventions: ?Assess patient/caregiver ability to obtain necessary supplies ?Assess patient/caregiver ability to perform ulcer/skin care regimen upon admission and as needed ?Assess ulceration(s) every visit ?Provide education on ulcer and skin care ?Treatment Activities: ?Topical wound management initiated : 08/25/2021 ?Notes: ?Electronic Signature(s) ?Signed: 08/25/2021 2:30:26 PM By: Antonieta Iba ?Entered By: Antonieta Iba on 08/25/2021 14:30:26 ?-------------------------------------------------------------------------------- ?Pain Assessment Details ?Patient Name: ?Date of Service: ?NA Colin Nephew A. 08/25/2021 1:15 PM ?Medical Record Number: [id number] ?Patient Account Number: [id number] ?Date of Birth/Sex: ?Treating RN: ?05/06/1956 (65 y.o. [name] ?Primary Care Meghan Tiemann: [name] ?Other Clinician: ?Referring Gelena Klosinski: ?Treating Jalee Saine/Extender: [name] ?SIKO RA, REBECCA ?Weeks in Treatment: 0 ?Active Problems ?Location of Pain Severity and Description of Pain ?Patient Has Paino No ?Site Locations ?Pain Management and Medication ?Current Pain  Management: ?Electronic Signature(s) ?Signed: 08/25/2021 5:05:04 PM By: Antonieta Iba ?Entered By: Antonieta Iba on 08/25/2021 14:11:17 ?-------------------------------------------------------------------------------- ?Patient/Care

## 2021-08-26 DIAGNOSIS — I739 Peripheral vascular disease, unspecified: Secondary | ICD-10-CM | POA: Diagnosis not present

## 2021-08-26 DIAGNOSIS — T8131XA Disruption of external operation (surgical) wound, not elsewhere classified, initial encounter: Secondary | ICD-10-CM | POA: Diagnosis not present

## 2021-08-26 NOTE — Progress Notes (Addendum)
HANFORD, LUST A. ([id number]) ?Visit Report for 08/25/2021 ?Chief Complaint Document Details ?Patient Name: Date of Service: ?NA Colin Nephew A. 08/25/2021 1:15 PM ?Medical Record Number: [id number] ?Patient Account Number: [id number] ?Date of Birth/Sex: Treating RN: ?December 22, 1955 (66 y.o. M) ?Primary Care Provider: [name] Other Clinician: ?Referring Provider: ?Treating Provider/Extender: [name] ?SIKO RA, REBECCA ?Weeks in Treatment: 0 ?Information Obtained from: Patient ?Chief Complaint ?Patients presents for treatment of an open diabetic ulcer with exposed bone and osteomyelitis ?Electronic Signature(s) ?Signed: 08/26/2021 10:56:05 AM By: [name] MD FACS ?Entered By: [name] on 08/26/2021 10:56:04 ?-------------------------------------------------------------------------------- ?Debridement Details ?Patient Name: Date of Service: ?NA Colin Nephew A. 08/25/2021 1:15 PM ?Medical Record Number: [id number] ?Patient Account Number: [id number] ?Date of Birth/Sex: Treating RN: ?Apr 27, 1956 (66 y.o. [name] ?Primary Care Provider: [name] Other Clinician: ?Referring Provider: ?Treating Provider/Extender: [name] ?SIKO RA, REBECCA ?Weeks in Treatment: 0 ?Debridement Performed for Assessment: Wound #1 Left,Medial Foot ?Performed By: Physician [name], MD ?Debridement Type: Debridement ?Severity of Tissue Pre Debridement: Bone involvement without necrosis ?Level of Consciousness (Pre-procedure): Awake and Alert ?Pre-procedure Verification/Time Out Yes - 14:34 ?Taken: ?Start Time: 14:35 ?Pain Control: ?Other : benzocaine ?T Area Debrided (L x W): ?otal 4 (cm) x 4.5 (cm) = 18 (cm?) ?Tissue and other material debrided: Non-Viable, Slough, Subcutaneous, Slough ?Level: Skin/Subcutaneous Tissue ?Debridement Description: Excisional ?Instrument: Blade, Forceps, Scissors ?Bleeding: Moderate ?Hemostasis Achieved: Pressure ?End Time: 14:44 ?Response to Treatment: Procedure was  tolerated well ?Level of Consciousness (Post- Awake and Alert ?procedure): ?Post Debridement Measurements of Total Wound ?Length: (cm) 4 ?Width: (cm) 4.5 ?Depth: (cm) 1 ?Volume: (cm?) 14.137 ?Character of Wound/Ulcer Post Debridement: Stable ?Severity of Tissue Post Debridement: Bone involvement without necrosis ?Post Procedure Diagnosis ?Same as Pre-procedure ?Electronic Signature(s) ?Signed: 08/25/2021 5:05:04 PM By: Antonieta Iba ?Signed: 08/26/2021 11:59:01 AM By: [name] MD FACS ?Entered By: Antonieta Iba on 08/25/2021 14:44:29 ?-------------------------------------------------------------------------------- ?HPI Details ?Patient Name: Date of Service: ?NA Colin Nephew A. 08/25/2021 1:15 PM ?Medical Record Number: [id number] ?Patient Account Number: [id number] ?Date of Birth/Sex: Treating RN: ?10-21-1955 (66 y.o. M) ?Primary Care Provider: [name] Other Clinician: ?Referring Provider: ?Treating Provider/Extender: [name] ?SIKO RA, REBECCA ?Weeks in Treatment: 0 ?History of Present Illness ?HPI Description: ADMISSION ?08/25/2021 ?This is a 66 year old man who initially presented to his primary care provider in September 2022 with pain in his left foot. He was sent for an x-ray and while ?the x-ray was being performed, the tech pointed out a wound on his foot that the patient was not aware existed. He does have type 2 diabetes with significant ?neuropathy. His diabetes is suboptimally controlled with his most recent A1c being 8.5. He also has a history of coronary artery disease status post three- ?vessel CABG. he was initially seen by orthopedics, but they referred him to [facility]. He has undergone at least 7 ?operations/debridements and several applications of skin substitute under the care of podiatry. He has been in a wound VAC for much of this time. His most ?recent procedure was July 28, 2021. A portion of the talus was biopsied and was found to be consistent with  osteomyelitis. Culture also returned positive ?for corynebacterium. He was seen on August 16, 2021 by infectious disease. A PICC line has been placed and he will be receiving a 6-week course of IV ?daptomycin and cefepime. In October 2022, he underwent lower extremity vascular studies. Results are copied here: ?Right: Resting right ankle-brachial index is within  normal range. No ?evidence of significant right lower extremity arterial disease. The right ?toe-brachial index is abnormal. ?Left: Resting left ankle-brachial index indicates mild left lower ?extremity arterial disease. The left toe-brachial index is abnormal. ?He has not been seen by vascular surgery despite these findings. ?He presented to clinic today in a cam boot and is using a knee scooter to offload. Wound VAC was in place. Once this was removed, a large ulcer was ?identified on the left midfoot/ankle. Bone is frankly exposed. There is no malodorous or purulent drainage. There is some granulation tissue over the central ?portion of the exposed bone. There is a tunnel that extends posteriorly for roughly 10 cm. ?It has been discussed with him by multiple providers that he is at very high risk of losing his lower leg because of this wound. He is extremely eager to avoid ?this outcome and is here today to review his options as well as receive ongoing wound care. ?Electronic Signature(s) ?Signed: 08/26/2021 11:47:53 AM By: [name] MD FACS ?Previous Signature: 08/26/2021 11:08:59 AM Version By: [name] MD FACS ?Entered By: [name] on 08/26/2021 11:47:53 ?-------------------------------------------------------------------------------- ?Physical Exam Details ?Patient Name: Date of Service: ?NA Colin Nephew A. 08/25/2021 1:15 PM ?Medical Record Number: [id number] ?Patient Account Number: [id number] ?Date of Birth/Sex: Treating RN: ?1955-12-27 (66 y.o. M) ?Primary Care Provider: [name] Other Clinician: ?Referring Provider: ?Treating  Provider/Extender: [name] ?SIKO RA, REBECCA ?Weeks in Treatment: 0 ?Constitutional ?. . . . No acute distress. ?Cardiovascular ?Nonpalpable pedal pulses on the left.Marland Kitchen ?Notes ?08/25/2021: On the left medial ankle, there is a large ulcer with undermining and extensive exposed bone. There is some pale granulation tissue over the cut ?surface of the bone, where the biopsy was taken. There is a tunnel tract extending from about the 10:00 position for around 10 cm. No foul odor or purulent ?drainage identified. ?Electronic Signature(s) ?Signed: 08/26/2021 11:52:14 AM By: [name] MD FACS ?Entered By: [name] on 08/26/2021 11:52:14 ?-------------------------------------------------------------------------------- ?Physician Orders Details ?Patient Name: Date of Service: ?NA Colin Nephew A. 08/25/2021 1:15 PM ?Medical Record Number: [id number] ?Patient Account Number: [id number] ?Date of Birth/Sex: Treating RN: ?1955-08-12 (66 y.o. [name] ?Primary Care Provider: [name] Other Clinician: ?Referring Provider: ?Treating Provider/Extender: [name] ?SIKO RA, REBECCA ?Weeks in Treatment: 0 ?Verbal / Phone Orders: No ?Diagnosis Coding ?ICD-10 Coding ?Code Description ?G99.242 Other chronic osteomyelitis, left ankle and foot ?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 ?E11.621 Type 2 diabetes mellitus with foot ulcer ?I25.10 Atherosclerotic heart disease of native coronary artery without angina pectoris ?Follow-up Appointments ?ppointment in 1 week. - with Dr. Lady Gary ?Return A ?Negative Presssure Wound Therapy ?Wound Vac to wound continuously at 151mm/hg pressure - Apply saline moistened (lightly) 1/2 inch packing strip to tunnel at 8:00, white foam to ?undermining and bone 1:00 to 4:00 then black foam over wound bed. Change vac 3x week. ?Black Foam - Over wound bed ?White Foam - Apply white foam to bone and undermining from 1:00-4:00 ?Home Health ?New wound  care orders this week; continue Home Health for wound care. May utilize formulary equivalent dressing for wound treatment ?orders unless otherwise specified. - New vac orders ?Dressing changes to be complete

## 2021-08-27 DIAGNOSIS — Z7984 Long term (current) use of oral hypoglycemic drugs: Secondary | ICD-10-CM | POA: Diagnosis not present

## 2021-08-27 DIAGNOSIS — E785 Hyperlipidemia, unspecified: Secondary | ICD-10-CM | POA: Diagnosis not present

## 2021-08-27 DIAGNOSIS — I2510 Atherosclerotic heart disease of native coronary artery without angina pectoris: Secondary | ICD-10-CM | POA: Diagnosis not present

## 2021-08-27 DIAGNOSIS — M1990 Unspecified osteoarthritis, unspecified site: Secondary | ICD-10-CM | POA: Diagnosis not present

## 2021-08-27 DIAGNOSIS — E11610 Type 2 diabetes mellitus with diabetic neuropathic arthropathy: Secondary | ICD-10-CM | POA: Diagnosis not present

## 2021-08-27 DIAGNOSIS — E11621 Type 2 diabetes mellitus with foot ulcer: Secondary | ICD-10-CM | POA: Diagnosis not present

## 2021-08-27 DIAGNOSIS — Z951 Presence of aortocoronary bypass graft: Secondary | ICD-10-CM | POA: Diagnosis not present

## 2021-08-27 DIAGNOSIS — Z794 Long term (current) use of insulin: Secondary | ICD-10-CM | POA: Diagnosis not present

## 2021-08-27 DIAGNOSIS — K219 Gastro-esophageal reflux disease without esophagitis: Secondary | ICD-10-CM | POA: Diagnosis not present

## 2021-08-27 DIAGNOSIS — E1140 Type 2 diabetes mellitus with diabetic neuropathy, unspecified: Secondary | ICD-10-CM | POA: Diagnosis not present

## 2021-08-27 DIAGNOSIS — I10 Essential (primary) hypertension: Secondary | ICD-10-CM | POA: Diagnosis not present

## 2021-08-27 DIAGNOSIS — I739 Peripheral vascular disease, unspecified: Secondary | ICD-10-CM | POA: Diagnosis not present

## 2021-08-27 DIAGNOSIS — I252 Old myocardial infarction: Secondary | ICD-10-CM | POA: Diagnosis not present

## 2021-08-27 DIAGNOSIS — Z7982 Long term (current) use of aspirin: Secondary | ICD-10-CM | POA: Diagnosis not present

## 2021-08-27 DIAGNOSIS — L97522 Non-pressure chronic ulcer of other part of left foot with fat layer exposed: Secondary | ICD-10-CM | POA: Diagnosis not present

## 2021-08-27 DIAGNOSIS — Z9181 History of falling: Secondary | ICD-10-CM | POA: Diagnosis not present

## 2021-08-27 DIAGNOSIS — D649 Anemia, unspecified: Secondary | ICD-10-CM | POA: Diagnosis not present

## 2021-08-27 DIAGNOSIS — T8131XA Disruption of external operation (surgical) wound, not elsewhere classified, initial encounter: Secondary | ICD-10-CM | POA: Diagnosis not present

## 2021-08-28 DIAGNOSIS — I739 Peripheral vascular disease, unspecified: Secondary | ICD-10-CM | POA: Diagnosis not present

## 2021-08-28 DIAGNOSIS — T8131XA Disruption of external operation (surgical) wound, not elsewhere classified, initial encounter: Secondary | ICD-10-CM | POA: Diagnosis not present

## 2021-08-29 DIAGNOSIS — T8131XA Disruption of external operation (surgical) wound, not elsewhere classified, initial encounter: Secondary | ICD-10-CM | POA: Diagnosis not present

## 2021-08-29 DIAGNOSIS — I739 Peripheral vascular disease, unspecified: Secondary | ICD-10-CM | POA: Diagnosis not present

## 2021-08-30 DIAGNOSIS — I739 Peripheral vascular disease, unspecified: Secondary | ICD-10-CM | POA: Diagnosis not present

## 2021-08-30 DIAGNOSIS — K219 Gastro-esophageal reflux disease without esophagitis: Secondary | ICD-10-CM | POA: Diagnosis not present

## 2021-08-30 DIAGNOSIS — I2510 Atherosclerotic heart disease of native coronary artery without angina pectoris: Secondary | ICD-10-CM | POA: Diagnosis not present

## 2021-08-30 DIAGNOSIS — I252 Old myocardial infarction: Secondary | ICD-10-CM | POA: Diagnosis not present

## 2021-08-30 DIAGNOSIS — E11610 Type 2 diabetes mellitus with diabetic neuropathic arthropathy: Secondary | ICD-10-CM | POA: Diagnosis not present

## 2021-08-30 DIAGNOSIS — Z9181 History of falling: Secondary | ICD-10-CM | POA: Diagnosis not present

## 2021-08-30 DIAGNOSIS — Z794 Long term (current) use of insulin: Secondary | ICD-10-CM | POA: Diagnosis not present

## 2021-08-30 DIAGNOSIS — E11621 Type 2 diabetes mellitus with foot ulcer: Secondary | ICD-10-CM | POA: Diagnosis not present

## 2021-08-30 DIAGNOSIS — M869 Osteomyelitis, unspecified: Secondary | ICD-10-CM | POA: Diagnosis not present

## 2021-08-30 DIAGNOSIS — Z7984 Long term (current) use of oral hypoglycemic drugs: Secondary | ICD-10-CM | POA: Diagnosis not present

## 2021-08-30 DIAGNOSIS — E785 Hyperlipidemia, unspecified: Secondary | ICD-10-CM | POA: Diagnosis not present

## 2021-08-30 DIAGNOSIS — L97522 Non-pressure chronic ulcer of other part of left foot with fat layer exposed: Secondary | ICD-10-CM | POA: Diagnosis not present

## 2021-08-30 DIAGNOSIS — Z951 Presence of aortocoronary bypass graft: Secondary | ICD-10-CM | POA: Diagnosis not present

## 2021-08-30 DIAGNOSIS — L97509 Non-pressure chronic ulcer of other part of unspecified foot with unspecified severity: Secondary | ICD-10-CM | POA: Diagnosis not present

## 2021-08-30 DIAGNOSIS — D649 Anemia, unspecified: Secondary | ICD-10-CM | POA: Diagnosis not present

## 2021-08-30 DIAGNOSIS — Z792 Long term (current) use of antibiotics: Secondary | ICD-10-CM | POA: Diagnosis not present

## 2021-08-30 DIAGNOSIS — M1990 Unspecified osteoarthritis, unspecified site: Secondary | ICD-10-CM | POA: Diagnosis not present

## 2021-08-30 DIAGNOSIS — Z7982 Long term (current) use of aspirin: Secondary | ICD-10-CM | POA: Diagnosis not present

## 2021-08-30 DIAGNOSIS — E1169 Type 2 diabetes mellitus with other specified complication: Secondary | ICD-10-CM | POA: Diagnosis not present

## 2021-08-30 DIAGNOSIS — I10 Essential (primary) hypertension: Secondary | ICD-10-CM | POA: Diagnosis not present

## 2021-08-30 DIAGNOSIS — E1140 Type 2 diabetes mellitus with diabetic neuropathy, unspecified: Secondary | ICD-10-CM | POA: Diagnosis not present

## 2021-08-30 DIAGNOSIS — T8131XA Disruption of external operation (surgical) wound, not elsewhere classified, initial encounter: Secondary | ICD-10-CM | POA: Diagnosis not present

## 2021-08-30 NOTE — Progress Notes (Signed)
?  Subjective:  ?Patient ID: [name], male    DOB: 30-May-1956,  MRN: [id number] ? ?Chief Complaint  ?Patient presents with  ? Routine Post Op  ?   POV #2 DOS - 07/28/21 --- LEF FOOT WOUND DEBRIDMENT ND IRRIGATION, APPLICATION OF SKIN GRAFT SUBSTITUTE, POSSIBLE BONE BIOPSY, VAC APPLICATION  ? ? ?66 y.o. male presents with the above complaint. History confirmed with patient.  He is known to myself and is in transition of care from Dr. Samuella Cota.  He recently underwent debridement application of skin graft substitute and wound VAC on 07/28/2021 with Dr. Samuella Cota.  Prior to this he also underwent debridement on 07/06/2021 as well as 05/20/2019 to 04/13/2021 03/19/2021 and 03/16/2021.  He has appointment with the wound care clinic scheduled for next week.  He is currently receiving PICC line and 6 weeks of daptomycin and cefepime through 09/29/2021 ? ?Objective:  ?Physical Exam: ?warm, good capillary refill, normal DP and PT pulses, and gross loss of peripheral sensation in the foot, he has a large medial wound over the talonavicular joint measuring 4.0 x 4.5 x 0.5 cm with exposed talonavicular joint and talar head. ? ? ?Assessment:  ? ?1. Diabetic ulcer of left midfoot associated with type 2 diabetes mellitus, with necrosis of bone (HCC)   ? ? ? ?Plan:  ?Patient was evaluated and treated and all questions answered. ? ?Reviewed the patient's progress and we discussed the severity of his wound in detail.  I discussed with him that osteomyelitis of this area is quite difficult to be rid of and he is undergone multiple surgical debridements.  He is seeing the wound care center this week and potentially he could be a candidate for hyperbaric oxygen treatment to see if this facilitates wound healing and cure of his osteomyelitis.  I discussed with him that even with this he is still at high risk of amputation or recurrence.  He understands and wishes to save his leg at all cost at this point.  I do think if this is unsuccessful then  he likely is out of options which I discussed with him and would best be served with a below-knee amputation and prosthetic.  The wound was inspected today, no major debridement was performed, his negative pressure wound therapy device was reapplied after examination.  He will follow-up with me in 1 month to reassess his progress ? ?No follow-ups on file.  ? ?

## 2021-08-31 DIAGNOSIS — I739 Peripheral vascular disease, unspecified: Secondary | ICD-10-CM | POA: Diagnosis not present

## 2021-08-31 DIAGNOSIS — T8131XA Disruption of external operation (surgical) wound, not elsewhere classified, initial encounter: Secondary | ICD-10-CM | POA: Diagnosis not present

## 2021-09-01 ENCOUNTER — Encounter (HOSPITAL_BASED_OUTPATIENT_CLINIC_OR_DEPARTMENT_OTHER): Payer: BC Managed Care – PPO | Admitting: General Surgery

## 2021-09-01 DIAGNOSIS — Z7982 Long term (current) use of aspirin: Secondary | ICD-10-CM | POA: Diagnosis not present

## 2021-09-01 DIAGNOSIS — I10 Essential (primary) hypertension: Secondary | ICD-10-CM | POA: Diagnosis not present

## 2021-09-01 DIAGNOSIS — Z9181 History of falling: Secondary | ICD-10-CM | POA: Diagnosis not present

## 2021-09-01 DIAGNOSIS — Z7984 Long term (current) use of oral hypoglycemic drugs: Secondary | ICD-10-CM | POA: Diagnosis not present

## 2021-09-01 DIAGNOSIS — E11621 Type 2 diabetes mellitus with foot ulcer: Secondary | ICD-10-CM | POA: Diagnosis not present

## 2021-09-01 DIAGNOSIS — Z951 Presence of aortocoronary bypass graft: Secondary | ICD-10-CM | POA: Diagnosis not present

## 2021-09-01 DIAGNOSIS — E11610 Type 2 diabetes mellitus with diabetic neuropathic arthropathy: Secondary | ICD-10-CM | POA: Diagnosis not present

## 2021-09-01 DIAGNOSIS — E785 Hyperlipidemia, unspecified: Secondary | ICD-10-CM | POA: Diagnosis not present

## 2021-09-01 DIAGNOSIS — M1990 Unspecified osteoarthritis, unspecified site: Secondary | ICD-10-CM | POA: Diagnosis not present

## 2021-09-01 DIAGNOSIS — D649 Anemia, unspecified: Secondary | ICD-10-CM | POA: Diagnosis not present

## 2021-09-01 DIAGNOSIS — L97522 Non-pressure chronic ulcer of other part of left foot with fat layer exposed: Secondary | ICD-10-CM | POA: Diagnosis not present

## 2021-09-01 DIAGNOSIS — Z794 Long term (current) use of insulin: Secondary | ICD-10-CM | POA: Diagnosis not present

## 2021-09-01 DIAGNOSIS — I739 Peripheral vascular disease, unspecified: Secondary | ICD-10-CM | POA: Diagnosis not present

## 2021-09-01 DIAGNOSIS — I252 Old myocardial infarction: Secondary | ICD-10-CM | POA: Diagnosis not present

## 2021-09-01 DIAGNOSIS — K219 Gastro-esophageal reflux disease without esophagitis: Secondary | ICD-10-CM | POA: Diagnosis not present

## 2021-09-01 DIAGNOSIS — I2510 Atherosclerotic heart disease of native coronary artery without angina pectoris: Secondary | ICD-10-CM | POA: Diagnosis not present

## 2021-09-01 DIAGNOSIS — E1140 Type 2 diabetes mellitus with diabetic neuropathy, unspecified: Secondary | ICD-10-CM | POA: Diagnosis not present

## 2021-09-01 DIAGNOSIS — T8131XA Disruption of external operation (surgical) wound, not elsewhere classified, initial encounter: Secondary | ICD-10-CM | POA: Diagnosis not present

## 2021-09-02 DIAGNOSIS — T8131XA Disruption of external operation (surgical) wound, not elsewhere classified, initial encounter: Secondary | ICD-10-CM | POA: Diagnosis not present

## 2021-09-02 DIAGNOSIS — I739 Peripheral vascular disease, unspecified: Secondary | ICD-10-CM | POA: Diagnosis not present

## 2021-09-03 ENCOUNTER — Other Ambulatory Visit: Payer: Self-pay

## 2021-09-03 ENCOUNTER — Encounter (HOSPITAL_BASED_OUTPATIENT_CLINIC_OR_DEPARTMENT_OTHER): Payer: BC Managed Care – PPO | Admitting: General Surgery

## 2021-09-03 DIAGNOSIS — M86672 Other chronic osteomyelitis, left ankle and foot: Secondary | ICD-10-CM | POA: Diagnosis not present

## 2021-09-03 DIAGNOSIS — E1140 Type 2 diabetes mellitus with diabetic neuropathy, unspecified: Secondary | ICD-10-CM | POA: Diagnosis not present

## 2021-09-03 DIAGNOSIS — Z951 Presence of aortocoronary bypass graft: Secondary | ICD-10-CM | POA: Diagnosis not present

## 2021-09-03 DIAGNOSIS — E1151 Type 2 diabetes mellitus with diabetic peripheral angiopathy without gangrene: Secondary | ICD-10-CM | POA: Diagnosis not present

## 2021-09-03 DIAGNOSIS — I739 Peripheral vascular disease, unspecified: Secondary | ICD-10-CM | POA: Diagnosis not present

## 2021-09-03 DIAGNOSIS — E11610 Type 2 diabetes mellitus with diabetic neuropathic arthropathy: Secondary | ICD-10-CM | POA: Diagnosis not present

## 2021-09-03 DIAGNOSIS — K219 Gastro-esophageal reflux disease without esophagitis: Secondary | ICD-10-CM | POA: Diagnosis not present

## 2021-09-03 DIAGNOSIS — E7800 Pure hypercholesterolemia, unspecified: Secondary | ICD-10-CM | POA: Diagnosis not present

## 2021-09-03 DIAGNOSIS — T8131XA Disruption of external operation (surgical) wound, not elsewhere classified, initial encounter: Secondary | ICD-10-CM | POA: Diagnosis not present

## 2021-09-03 DIAGNOSIS — I2510 Atherosclerotic heart disease of native coronary artery without angina pectoris: Secondary | ICD-10-CM | POA: Diagnosis not present

## 2021-09-03 DIAGNOSIS — E11621 Type 2 diabetes mellitus with foot ulcer: Secondary | ICD-10-CM | POA: Diagnosis not present

## 2021-09-03 NOTE — Progress Notes (Addendum)
[name], Sender A. ([id number]) Visit Report for 09/03/2021 Arrival Information Details Patient Name: Date of Service: NA [name], [name] A. 09/03/2021 12:45 PM Medical Record Number: [id number] Patient Account Number: [id number] Date of Birth/Sex: Treating RN: 02/23/1956 (66 y.o. [name]) Lynch, [name] Primary Care [name]: [name] Other Clinician: Referring [name]: Treating [name]/Extender: [name], [name] in Treatment: 1 Visit Information History Since Last Visit Added or deleted any medications: No Patient Arrived: Knee Scooter Any new allergies or adverse reactions: No Arrival Time: 13:12 Had a fall or experienced change in No Accompanied By: alone activities of daily living that may affect Transfer Assistance: None risk of falls: Patient Requires Transmission-Based Precautions: No Signs or symptoms of abuse/neglect since last visito No Patient Has Alerts: Yes Hospitalized since last visit: No Patient Alerts: Patient on Blood Thinner Implantable device outside of the clinic excluding No PICC Line Right Arm cellular tissue based products placed in the center R ABI=1.1 TBI=.55 since last visit: L ABI=.86 TBI=.36 Has Dressing in Place as Prescribed: Yes Pain Present Now: No Electronic Signature(s) Signed: 09/03/2021 4:52:25 PM By: [name], Shatara RN, BSN Entered By: [name], [name] on 09/03/2021 13:13:41 -------------------------------------------------------------------------------- Encounter Discharge Information Details Patient Name: Date of Service: NA [name], [name] A. 09/03/2021 12:45 PM Medical Record Number: [id number] Patient Account Number: [id number] Date of Birth/Sex: Treating RN: 02/23/1956 (66 y.o. [name]) Lynch, [name] Primary Care Yesena Reaves: [name] Other Clinician: Referring Kiarah Eckstein: Treating Lurlie Wigen/Extender: [name], [name] in Treatment: 1 Encounter Discharge Information Items Post Procedure Vitals Discharge Condition:  Stable Temperature (F): 97.9 Ambulatory Status: Knee Scooter Pulse (bpm): 85 Discharge Destination: Home Respiratory Rate (breaths/min): 18 Transportation: Private Auto Blood Pressure (mmHg): 137/82 Accompanied By: alone Schedule Follow-up Appointment: Yes Clinical Summary of Care: Patient Declined Electronic Signature(s) Signed: 09/03/2021 4:52:25 PM By: [name], Shatara RN, BSN Entered By: [name], [name] on 09/03/2021 16:29:02 -------------------------------------------------------------------------------- Lower Extremity Assessment Details Patient Name: Date of Service: NA Colin NephewRRO [name], [name] A. 09/03/2021 12:45 PM Medical Record Number: [id number] Patient Account Number: [id number] Date of Birth/Sex: Treating RN: 02/23/1956 (66 y.o. [name]) Lynch, [name] Primary Care Max Nuno: [name] Other Clinician: Referring Gean Laursen: Treating Maclovia Uher/Extender: [name], [name] in Treatment: 1 Edema Assessment Assessed: [Left: No] [Right: No] E[Left: dema] [Right: :] Calf Left: Right: Point of Measurement: From Medial Instep 36 cm Ankle Left: Right: Point of Measurement: From Medial Instep 21.5 cm Vascular Assessment Pulses: Dorsalis Pedis Palpable: [Left:Yes] Electronic Signature(s) Signed: 09/03/2021 4:52:25 PM By: [name], Shatara RN, BSN Entered By: [name], [name] on 09/03/2021 13:27:51 -------------------------------------------------------------------------------- Multi Wound Chart Details Patient Name: Date of Service: NA [name], [name] A. 09/03/2021 12:45 PM Medical Record Number: [id number] Patient Account Number: [id number] Date of Birth/Sex: Treating RN: 02/23/1956 (66 y.o. [name]) Lynch, [name] Primary Care Dashana Guizar: [name] Other Clinician: Referring Yolandra Habig: Treating Evett Kassa/Extender: [name], [name] in Treatment: 1 Vital Signs Height(in): 74 Capillary Blood Glucose(mg/dl): 78 Weight(lbs): 244186 Pulse(bpm): 85 Body Mass Index(BMI):  23.9 Blood Pressure(mmHg): 137/82 Temperature(F): 97.9 Respiratory Rate(breaths/min): 18 Photos: [1:Left, Medial Foot] [[name] [name]/A] Wound Location: [1:Gradually Appeared] [[name]] Wounding Event: [1:Diabetic Wound/Ulcer of the Lower] [[name]] Primary Etiology: [1:Extremity Cataracts, Coronary Artery Disease, [name]/A] Comorbid History: [1:Hypertension, Myocardial Infarction, Peripheral Arterial Disease, Type II Diabetes, Osteomyelitis, Neuropathy 02/22/2021] [[name]] Date Acquired: [1:1] [[name]] [name] of Treatment: [1:Open] [[name]] Wound Status: [1:No] [[name]] Wound Recurrence: [1:6.2x4x1.6] [[name]] Measurements L x W x D (cm) [1:19.478] [[name]] A (cm) : rea [1:31.165] [[name]] Volume (cm) : [1:-37.80%] [[name]] %  Reduction in A [1:rea: -120.40%] [[name]] % Reduction in Volume: [1:10] Position 1 (o'clock): [1:3.3] Maximum Distance 1 (cm): [1:12] Starting Position 1 (o'clock): [1:2] Ending Position 1 (o'clock): [1:2.7] Maximum Distance 1 (cm): [1:Yes] [[name]] Tunneling: [1:Yes] [[name]] Undermining: [1:Grade 3] [[name]] Classification: [1:Medium] [[name]] Exudate A mount: [1:Sanguinous] [[name]] Exudate Type: [1:red] [[name]] Exudate Color: [1:Distinct, outline attached] [[name]] Wound Margin: [1:Medium (34-66%)] [[name]] Granulation A mount: [1:Red, Pink] [[name]] Granulation Quality: [1:Medium (34-66%)] [[name]] Necrotic A mount: [1:Fat Layer (Subcutaneous Tissue): Yes [name]/A] Exposed Structures: [1:Bone: Yes Fascia: No Tendon: No Muscle: No Joint: No None] [[name]] Epithelialization: [1:Debridement - Selective/Open Wound [name]/A] Debridement: Pre-procedure Verification/Time Out 13:43 [[name]] Taken: [1:Slough] [[name]] Tissue Debrided: [1:Non-Viable Tissue] [[name]] Level: [1:16] [[name]] Debridement A (sq cm): [1:rea Curette] [[name]] Instrument: [1:Minimum] [[name]] Bleeding: [1:Pressure] [[name]] Hemostasis A chieved: [1:0]  [[name]] Procedural Pain: [1:0] [[name]] Post Procedural Pain: [1:Procedure was tolerated well] [[name]] Debridement Treatment Response: [1:6.2x4x1.6] [[name]] Post Debridement Measurements L x W x D (cm) [1:31.165] [[name]] Post Debridement Volume: (cm) [1:Debridement] [[name]] Treatment Notes Electronic Signature(s) Signed: 09/03/2021 2:29:53 PM By: [name] MD FACS Signed: 09/03/2021 4:52:25 PM By: Colin Abts RN, BSN Entered By: [name] on 09/03/2021 14:29:53 -------------------------------------------------------------------------------- Multi-Disciplinary Care Plan Details Patient Name: Date of Service: Colin Fanning A. 09/03/2021 12:45 PM Medical Record Number: [id number] Patient Account Number: [id number] Date of Birth/Sex: Treating RN: 11/13/1955 (66 y.o. [name] Primary Care Corday Wyka: Colin Curia Other Clinician: Referring Evita Merida: Treating Liyah Higham/Extender: Colin Skill in Treatment: 1 Active Inactive Nutrition Nursing Diagnoses: Impaired glucose control: actual or potential Goals: Patient/caregiver verbalizes understanding of need to maintain therapeutic glucose control per primary care physician Date Initiated: 08/25/2021 Target Resolution Date: 09/22/2021 Goal Status: Active Interventions: Assess HgA1c results as ordered upon admission and as needed Provide education on elevated blood sugars and impact on wound healing Notes: Wound/Skin Impairment Nursing Diagnoses: Impaired tissue integrity Goals: Patient/caregiver will verbalize understanding of skin care regimen Date Initiated: 08/25/2021 Target Resolution Date: 09/22/2021 Goal Status: Active Ulcer/skin breakdown will have a volume reduction of 30% by week 4 Date Initiated: 08/25/2021 Target Resolution Date: 09/22/2021 Goal Status: Active Interventions: Assess patient/caregiver ability to obtain necessary supplies Assess patient/caregiver  ability to perform ulcer/skin care regimen upon admission and as needed Assess ulceration(s) every visit Provide education on ulcer and skin care Treatment Activities: Topical wound management initiated : 08/25/2021 Notes: Electronic Signature(s) Signed: 09/03/2021 4:52:25 PM By: Colin Abts RN, BSN Entered By: Colin Abts on 09/03/2021 13:35:49 -------------------------------------------------------------------------------- Negative Pressure Wound Therapy Application (NPWT) Details Patient Name: Date of Service: [name] 09/03/2021 12:45 PM Medical Record Number: [id number] Patient Account Number: [id number] Date of Birth/Sex: Treating RN: 06-Feb-1956 (66 y.o. [name] Primary Care Brittni Hult: Colin Curia Other Clinician: Referring Oaklyn Jakubek: Treating Maurilio Puryear/Extender: [name] [name] in Treatment: 1 NPWT Application Performed for: Wound #1 Left, Medial Foot Performed By: Colin Abts, RN Type: VAC System Coverage Size (sq cm): 24.8 Pressure Type: Constant Pressure Setting: 125 mmHG Drain Type: None Primary Contact: Non-Adherent Quantity of Sponges/Gauze Inserted: 1 piece black foam Sponge/Dressing Type: Foam, Black Date Initiated: 03/19/2021 Response to Treatment: pt tolerated well Post Procedure Diagnosis Same as Pre-procedure Electronic Signature(s) Signed: 09/03/2021 4:52:25 PM By: Colin Abts RN, BSN Entered By: Colin Abts on 09/03/2021 16:22:32 -------------------------------------------------------------------------------- Pain Assessment Details Patient Name: Date of Service: NA Colin Nephew A. 09/03/2021 12:45 PM Medical Record Number: [id number] Patient Account Number: [id number] Date of Birth/Sex: Treating RN:  Feb 01, 1956 (66 y.o. [name] Primary Care Lyndal Reggio: Colin Curia Other Clinician: Referring Hesston Hitchens: Treating Anyla Israelson/Extender: [name] [name] in Treatment: 1 Active  Problems Location of Pain Severity and Description of Pain Patient Has Paino No Site Locations Pain Management and Medication Current Pain Management: Electronic Signature(s) Signed: 09/03/2021 4:52:25 PM By: Colin Abts RN, BSN Entered By: Colin Abts on 09/03/2021 13:23:45 -------------------------------------------------------------------------------- Patient/Caregiver Education Details Patient Name: Date of Service: NA Colin Spruce 3/24/2023andnbsp12:45 PM Medical Record Number: [id number] Patient Account Number: [id number] Date of Birth/Gender: Treating RN: 01-18-1956 (66 y.o. [name] Primary Care Physician: Colin Curia Other Clinician: Referring Physician: Treating Physician/Extender: Colin Skill in Treatment: 1 Education Assessment Education Provided To: Patient Education Topics Provided Hyperbaric Oxygenation: Methods: Demonstration, Explain/Verbal, Printed Responses: Reinforcements needed, Return demonstration correctly, State content correctly Wound/Skin Impairment: Methods: Explain/Verbal Responses: State content correctly Electronic Signature(s) Signed: 11/10/2021 2:59:26 PM By: [name] CHT EMT BS , , Previous Signature: 09/03/2021 4:52:25 PM Version By: Colin Abts RN, BSN Entered By: [name] on 09/21/2021 11:16:06 -------------------------------------------------------------------------------- Wound Assessment Details Patient Name: Date of Service: Colin Fanning A. 09/03/2021 12:45 PM Medical Record Number: [id number] Patient Account Number: [id number] Date of Birth/Sex: Treating RN: 21-Aug-1955 (66 y.o. [name] Primary Care Eldin Bonsell: Colin Curia Other Clinician: Referring Elgie Maziarz: Treating Demetria Iwai/Extender: [name] [name] in Treatment: 1 Wound Status Wound Number: 1 Primary Diabetic Wound/Ulcer of the Lower Extremity Etiology: Wound Location: Left, Medial  Foot Wound Open Wounding Event: Gradually Appeared Status: Date Acquired: 02/22/2021 Comorbid Cataracts, Coronary Artery Disease, Hypertension, Myocardial [name] Of Treatment: 1 History: Infarction, Peripheral Arterial Disease, Type II Diabetes, Clustered Wound: No Osteomyelitis, Neuropathy Photos Wound Measurements Length: (cm) 6.2 Width: (cm) 4 Depth: (cm) 1.6 Area: (cm) 19.478 Volume: (cm) 31.165 % Reduction in Area: -37.8% % Reduction in Volume: -120.4% Epithelialization: None Tunneling: Yes Position (o'clock): 10 Maximum Distance: (cm) 3.3 Undermining: Yes Starting Position (o'clock): 12 Ending Position (o'clock): 2 Maximum Distance: (cm) 2.7 Wound Description Classification: Grade 3 Wound Margin: Distinct, outline attached Exudate Amount: Medium Exudate Type: Sanguinous Exudate Color: red Foul Odor After Cleansing: No Slough/Fibrino Yes Wound Bed Granulation Amount: Medium (34-66%) Exposed Structure Granulation Quality: Red, Pink Fascia Exposed: No Necrotic Amount: Medium (34-66%) Fat Layer (Subcutaneous Tissue) Exposed: Yes Necrotic Quality: Adherent Slough Tendon Exposed: No Muscle Exposed: No Joint Exposed: No Bone Exposed: Yes Electronic Signature(s) Signed: 09/03/2021 4:52:25 PM By: Colin Abts RN, BSN Signed: 09/03/2021 4:55:44 PM By: Karie Schwalbe RN Entered By: Karie Schwalbe on 09/03/2021 13:38:44 -------------------------------------------------------------------------------- Vitals Details Patient Name: Date of Service: NA Colin Nephew A. 09/03/2021 12:45 PM Medical Record Number: [id number] Patient Account Number: [id number] Date of Birth/Sex: Treating RN: [birth year] (66 y.o. [name] Primary Care Jatavious Peppard: Colin Curia Other Clinician: Referring Breaker Springer: Treating Zohair Epp/Extender: [name] [name] in Treatment: 1 Vital Signs Time Taken: 13:12 Temperature (F): 97.9 Height (in): 74 Pulse (bpm): 85 Weight  (lbs): 186 Respiratory Rate (breaths/min): 18 Body Mass Index (BMI): 23.9 Blood Pressure (mmHg): 137/82 Capillary Blood Glucose (mg/dl): 78 Reference Range: 80 - 120 mg / dl Notes glucose per pt report Electronic Signature(s) Signed: 09/03/2021 4:52:25 PM By: Colin Abts RN, BSN Entered By: Colin Abts on 09/03/2021 13:14:29

## 2021-09-04 DIAGNOSIS — T8131XA Disruption of external operation (surgical) wound, not elsewhere classified, initial encounter: Secondary | ICD-10-CM | POA: Diagnosis not present

## 2021-09-04 DIAGNOSIS — I739 Peripheral vascular disease, unspecified: Secondary | ICD-10-CM | POA: Diagnosis not present

## 2021-09-05 DIAGNOSIS — I739 Peripheral vascular disease, unspecified: Secondary | ICD-10-CM | POA: Diagnosis not present

## 2021-09-05 DIAGNOSIS — T8131XA Disruption of external operation (surgical) wound, not elsewhere classified, initial encounter: Secondary | ICD-10-CM | POA: Diagnosis not present

## 2021-09-06 DIAGNOSIS — Z9181 History of falling: Secondary | ICD-10-CM | POA: Diagnosis not present

## 2021-09-06 DIAGNOSIS — I10 Essential (primary) hypertension: Secondary | ICD-10-CM | POA: Diagnosis not present

## 2021-09-06 DIAGNOSIS — I252 Old myocardial infarction: Secondary | ICD-10-CM | POA: Diagnosis not present

## 2021-09-06 DIAGNOSIS — E11621 Type 2 diabetes mellitus with foot ulcer: Secondary | ICD-10-CM | POA: Diagnosis not present

## 2021-09-06 DIAGNOSIS — I739 Peripheral vascular disease, unspecified: Secondary | ICD-10-CM | POA: Diagnosis not present

## 2021-09-06 DIAGNOSIS — E785 Hyperlipidemia, unspecified: Secondary | ICD-10-CM | POA: Diagnosis not present

## 2021-09-06 DIAGNOSIS — Z792 Long term (current) use of antibiotics: Secondary | ICD-10-CM | POA: Diagnosis not present

## 2021-09-06 DIAGNOSIS — Z951 Presence of aortocoronary bypass graft: Secondary | ICD-10-CM | POA: Diagnosis not present

## 2021-09-06 DIAGNOSIS — D649 Anemia, unspecified: Secondary | ICD-10-CM | POA: Diagnosis not present

## 2021-09-06 DIAGNOSIS — Z794 Long term (current) use of insulin: Secondary | ICD-10-CM | POA: Diagnosis not present

## 2021-09-06 DIAGNOSIS — L97529 Non-pressure chronic ulcer of other part of left foot with unspecified severity: Secondary | ICD-10-CM | POA: Diagnosis not present

## 2021-09-06 DIAGNOSIS — I2510 Atherosclerotic heart disease of native coronary artery without angina pectoris: Secondary | ICD-10-CM | POA: Diagnosis not present

## 2021-09-06 DIAGNOSIS — Z7984 Long term (current) use of oral hypoglycemic drugs: Secondary | ICD-10-CM | POA: Diagnosis not present

## 2021-09-06 DIAGNOSIS — L97522 Non-pressure chronic ulcer of other part of left foot with fat layer exposed: Secondary | ICD-10-CM | POA: Diagnosis not present

## 2021-09-06 DIAGNOSIS — E1140 Type 2 diabetes mellitus with diabetic neuropathy, unspecified: Secondary | ICD-10-CM | POA: Diagnosis not present

## 2021-09-06 DIAGNOSIS — Z7982 Long term (current) use of aspirin: Secondary | ICD-10-CM | POA: Diagnosis not present

## 2021-09-06 DIAGNOSIS — K219 Gastro-esophageal reflux disease without esophagitis: Secondary | ICD-10-CM | POA: Diagnosis not present

## 2021-09-06 DIAGNOSIS — M1990 Unspecified osteoarthritis, unspecified site: Secondary | ICD-10-CM | POA: Diagnosis not present

## 2021-09-06 DIAGNOSIS — E11610 Type 2 diabetes mellitus with diabetic neuropathic arthropathy: Secondary | ICD-10-CM | POA: Diagnosis not present

## 2021-09-06 DIAGNOSIS — T8131XA Disruption of external operation (surgical) wound, not elsewhere classified, initial encounter: Secondary | ICD-10-CM | POA: Diagnosis not present

## 2021-09-06 NOTE — Progress Notes (Signed)
SLADE, ROEPKE A. ([id number]) ?Visit Report for 09/03/2021 ?Chief Complaint Document Details ?Patient Name: Date of Service: ?[name] 09/03/2021 12:45 PM ?Medical Record Number: [id number] ?Patient Account Number: [id number] ?Date of Birth/Sex: Treating RN: ?01/22/1956 (66 y.o. [name] ?Primary Care Provider: [name] Other Clinician: ?Referring Provider: ?Treating Provider/Extender: [name] ?[name] ?Weeks in Treatment: 1 ?Information Obtained from: Patient ?Chief Complaint ?Patients presents for treatment of an open diabetic ulcer with exposed bone and osteomyelitis ?Electronic Signature(s) ?Signed: 09/03/2021 2:30:01 PM By: [name] MD FACS ?Entered By: [name] on 09/03/2021 14:30:01 ?-------------------------------------------------------------------------------- ?Debridement Details ?Patient Name: Date of Service: ?[name] 09/03/2021 12:45 PM ?Medical Record Number: [id number] ?Patient Account Number: [id number] ?Date of Birth/Sex: Treating RN: ?1955-07-02 (66 y.o. [name] ?Primary Care Provider: [name] Other Clinician: ?Referring Provider: ?Treating Provider/Extender: [name] ?[name] ?Weeks in Treatment: 1 ?Debridement Performed for Assessment: Wound #1 Left,Medial Foot ?Performed By: Physician [name], MD ?Debridement Type: Debridement ?Severity of Tissue Pre Debridement: Bone involvement without necrosis ?Level of Consciousness (Pre-procedure): Awake and Alert ?Pre-procedure Verification/Time Out Yes - 13:43 ?Taken: ?Start Time: 13:43 ?T Area Debrided (L x W): ?otal 4 (cm) x 4 (cm) = 16 (cm?) ?Tissue and other material debrided: Non-Viable, [place], [place] ?Level: Non-Viable Tissue ?Debridement Description: Selective/Open Wound ?Instrument: Curette ?Bleeding: Minimum ?Hemostasis Achieved: Pressure ?End Time: 13:46 ?Procedural Pain: 0 ?Post Procedural Pain: 0 ?Response to Treatment: Procedure was tolerated well ?Level  of Consciousness (Post- Awake and Alert ?procedure): ?Post Debridement Measurements of Total Wound ?Length: (cm) 6.2 ?Width: (cm) 4 ?Depth: (cm) 1.6 ?Volume: (cm?) 31.165 ?Character of Wound/Ulcer Post Debridement: Stable ?Severity of Tissue Post Debridement: Fat layer exposed ?Post Procedure Diagnosis ?Same as Pre-procedure ?Electronic Signature(s) ?Signed: 09/03/2021 4:52:25 PM By: Levan Hurst RN, BSN ?Signed: 09/06/2021 7:55:23 AM By: [name] MD FACS ?Entered By: Levan Hurst on 09/03/2021 13:47:30 ?-------------------------------------------------------------------------------- ?HPI Details ?Patient Name: Date of Service: ?[name] 09/03/2021 12:45 PM ?Medical Record Number: [id number] ?Patient Account Number: [id number] ?Date of Birth/Sex: Treating RN: ?1955/07/16 (66 y.o. [name] ?Primary Care Provider: [name] Other Clinician: ?Referring Provider: ?Treating Provider/Extender: [name] ?[name] ?Weeks in Treatment: 1 ?History of Present Illness ?HPI Description: ADMISSION ?08/25/2021 ?This is a 66 year old man who initially presented to his primary care provider in September 2022 with pain in his left foot. He was sent for an x-ray and while ?the x-ray was being performed, the tech pointed out a wound on his foot that the patient was not aware existed. He does have type 2 diabetes with significant ?neuropathy. His diabetes is suboptimally controlled with his most recent A1c being 8.5. He also has a history of coronary artery disease status post three- ?vessel CABG. he was initially seen by orthopedics, but they referred him to [facility]. He has undergone at least 7 ?operations/debridements and several applications of skin substitute under the care of podiatry. He has been in a wound VAC for much of this time. His most ?recent procedure was July 28, 2021. A portion of the talus was biopsied and was found to be consistent with osteomyelitis.  Culture also returned positive ?for corynebacterium. He was seen on August 16, 2021 by infectious disease. A PICC line has been placed and he will be receiving a 6-week course of IV ?daptomycin and cefepime. In October 2022, he underwent lower extremity vascular studies. Results are copied here: ?Right: Resting right ankle-brachial index  is within normal range. No ?evidence of significant right lower extremity arterial disease. The right ?toe-brachial index is abnormal. ?Left: Resting left ankle-brachial index indicates mild left lower ?extremity arterial disease. The left toe-brachial index is abnormal. ?He has not been seen by vascular surgery despite these findings. ?He presented to clinic today in a cam boot and is using a knee scooter to offload. Wound VAC was in place. Once this was removed, a large ulcer was ?identified on the left midfoot/ankle. Bone is frankly exposed. There is no malodorous or purulent drainage. There is some granulation tissue over the central ?portion of the exposed bone. There is a tunnel that extends posteriorly for roughly 10 cm. ?It has been discussed with him by multiple providers that he is at very high risk of losing his lower leg because of this wound. He is extremely eager to avoid ?this outcome and is here today to review his options as well as receive ongoing wound care. ?09/03/2021: Here for reevaluation of his wound. There does not appear to have been any substantial improvement overall since our last visit. He has been in a ?wound VAC with white foam overlying the exposed bone. We are working on getting him approved for hyperbaric oxygen therapy. ?Electronic Signature(s) ?Signed: 09/03/2021 2:30:55 PM By: [name] MD FACS ?Entered By: [name] on 09/03/2021 14:30:55 ?-------------------------------------------------------------------------------- ?Physical Exam Details ?Patient Name: Date of Service: ?[name] 09/03/2021 12:45 PM ?Medical Record  Number: [id number] ?Patient Account Number: [id number] ?Date of Birth/Sex: Treating RN: ?01-22-1956 (66 y.o. [name] ?Primary Care Provider: [name] Other Clinician: ?Referring Provider: ?Treating Provider/Extender: [name] ?[name] ?Weeks in Treatment: 1 ?Constitutional ?. . . . No acute distress. ?Respiratory ?Marland Kitchen ?Notes ?09/03/2021: No real change in the appearance of the wound. The ulcer is large with exposed bone and a little bit of granulation tissue. No odor or purulent ?drainage. ?Electronic Signature(s) ?Signed: 09/03/2021 2:38:53 PM By: [name] MD FACS ?Entered By: [name] on 09/03/2021 14:38:53 ?-------------------------------------------------------------------------------- ?Physician Orders Details ?Patient Name: Date of Service: ?[name] 09/03/2021 12:45 PM ?Medical Record Number: [id number] ?Patient Account Number: [id number] ?Date of Birth/Sex: Treating RN: ?1955/10/22 (66 y.o. [name] ?Primary Care Provider: [name] Other Clinician: ?Referring Provider: ?Treating Provider/Extender: [name] ?[name] ?Weeks in Treatment: 1 ?Verbal / Phone Orders: No ?Diagnosis Coding ?ICD-10 Coding ?Code Description ?[id number] Other chronic osteomyelitis, left ankle and foot ?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 ?E11.621 Type 2 diabetes mellitus with foot ulcer ?I25.10 Atherosclerotic heart disease of native coronary artery without angina pectoris ?Follow-up Appointments ?ppointment in 1 week. - Dr. Celine Ahr - Room 2 ?Return A ?Negative Presssure Wound Therapy ?Wound Vac to wound continuously at 1102mm/hg pressure - Apply saline moistened (lightly) 1/2 inch packing strip to tunnel at 8:00, white foam to ?undermining and bone 12:00 to 4:00 then black foam over wound bed. Change vac 3x week. ?Black Foam - Over wound bed ?White Foam - Apply white foam to bone and undermining from 12:00-4:00 ?Home Health ?No change in wound  care orders this week; continue Home Health for wound care. May utilize formulary equivalent dressing for wound ?treatment orders unless otherwise specified. ?Dressing changes to be completed by Home Health on

## 2021-09-07 DIAGNOSIS — T8131XA Disruption of external operation (surgical) wound, not elsewhere classified, initial encounter: Secondary | ICD-10-CM | POA: Diagnosis not present

## 2021-09-07 DIAGNOSIS — I739 Peripheral vascular disease, unspecified: Secondary | ICD-10-CM | POA: Diagnosis not present

## 2021-09-08 DIAGNOSIS — K219 Gastro-esophageal reflux disease without esophagitis: Secondary | ICD-10-CM | POA: Diagnosis not present

## 2021-09-08 DIAGNOSIS — Z7984 Long term (current) use of oral hypoglycemic drugs: Secondary | ICD-10-CM | POA: Diagnosis not present

## 2021-09-08 DIAGNOSIS — Z951 Presence of aortocoronary bypass graft: Secondary | ICD-10-CM | POA: Diagnosis not present

## 2021-09-08 DIAGNOSIS — E1140 Type 2 diabetes mellitus with diabetic neuropathy, unspecified: Secondary | ICD-10-CM | POA: Diagnosis not present

## 2021-09-08 DIAGNOSIS — I10 Essential (primary) hypertension: Secondary | ICD-10-CM | POA: Diagnosis not present

## 2021-09-08 DIAGNOSIS — Z9181 History of falling: Secondary | ICD-10-CM | POA: Diagnosis not present

## 2021-09-08 DIAGNOSIS — D649 Anemia, unspecified: Secondary | ICD-10-CM | POA: Diagnosis not present

## 2021-09-08 DIAGNOSIS — E11610 Type 2 diabetes mellitus with diabetic neuropathic arthropathy: Secondary | ICD-10-CM | POA: Diagnosis not present

## 2021-09-08 DIAGNOSIS — Z7982 Long term (current) use of aspirin: Secondary | ICD-10-CM | POA: Diagnosis not present

## 2021-09-08 DIAGNOSIS — I252 Old myocardial infarction: Secondary | ICD-10-CM | POA: Diagnosis not present

## 2021-09-08 DIAGNOSIS — I2510 Atherosclerotic heart disease of native coronary artery without angina pectoris: Secondary | ICD-10-CM | POA: Diagnosis not present

## 2021-09-08 DIAGNOSIS — E785 Hyperlipidemia, unspecified: Secondary | ICD-10-CM | POA: Diagnosis not present

## 2021-09-08 DIAGNOSIS — I739 Peripheral vascular disease, unspecified: Secondary | ICD-10-CM | POA: Diagnosis not present

## 2021-09-08 DIAGNOSIS — L97522 Non-pressure chronic ulcer of other part of left foot with fat layer exposed: Secondary | ICD-10-CM | POA: Diagnosis not present

## 2021-09-08 DIAGNOSIS — E11621 Type 2 diabetes mellitus with foot ulcer: Secondary | ICD-10-CM | POA: Diagnosis not present

## 2021-09-08 DIAGNOSIS — Z794 Long term (current) use of insulin: Secondary | ICD-10-CM | POA: Diagnosis not present

## 2021-09-08 DIAGNOSIS — T8131XA Disruption of external operation (surgical) wound, not elsewhere classified, initial encounter: Secondary | ICD-10-CM | POA: Diagnosis not present

## 2021-09-08 DIAGNOSIS — M1990 Unspecified osteoarthritis, unspecified site: Secondary | ICD-10-CM | POA: Diagnosis not present

## 2021-09-09 DIAGNOSIS — T8131XA Disruption of external operation (surgical) wound, not elsewhere classified, initial encounter: Secondary | ICD-10-CM | POA: Diagnosis not present

## 2021-09-09 DIAGNOSIS — I10 Essential (primary) hypertension: Secondary | ICD-10-CM | POA: Diagnosis not present

## 2021-09-09 DIAGNOSIS — E1140 Type 2 diabetes mellitus with diabetic neuropathy, unspecified: Secondary | ICD-10-CM | POA: Diagnosis not present

## 2021-09-09 DIAGNOSIS — I2510 Atherosclerotic heart disease of native coronary artery without angina pectoris: Secondary | ICD-10-CM | POA: Diagnosis not present

## 2021-09-09 DIAGNOSIS — E119 Type 2 diabetes mellitus without complications: Secondary | ICD-10-CM | POA: Diagnosis not present

## 2021-09-09 DIAGNOSIS — I739 Peripheral vascular disease, unspecified: Secondary | ICD-10-CM | POA: Diagnosis not present

## 2021-09-10 ENCOUNTER — Encounter (HOSPITAL_BASED_OUTPATIENT_CLINIC_OR_DEPARTMENT_OTHER): Payer: BC Managed Care – PPO | Admitting: General Surgery

## 2021-09-10 DIAGNOSIS — K219 Gastro-esophageal reflux disease without esophagitis: Secondary | ICD-10-CM | POA: Diagnosis not present

## 2021-09-10 DIAGNOSIS — M86672 Other chronic osteomyelitis, left ankle and foot: Secondary | ICD-10-CM | POA: Diagnosis not present

## 2021-09-10 DIAGNOSIS — E7800 Pure hypercholesterolemia, unspecified: Secondary | ICD-10-CM | POA: Diagnosis not present

## 2021-09-10 DIAGNOSIS — Z951 Presence of aortocoronary bypass graft: Secondary | ICD-10-CM | POA: Diagnosis not present

## 2021-09-10 DIAGNOSIS — E11610 Type 2 diabetes mellitus with diabetic neuropathic arthropathy: Secondary | ICD-10-CM | POA: Diagnosis not present

## 2021-09-10 DIAGNOSIS — I739 Peripheral vascular disease, unspecified: Secondary | ICD-10-CM | POA: Diagnosis not present

## 2021-09-10 DIAGNOSIS — T8131XA Disruption of external operation (surgical) wound, not elsewhere classified, initial encounter: Secondary | ICD-10-CM | POA: Diagnosis not present

## 2021-09-10 DIAGNOSIS — E1140 Type 2 diabetes mellitus with diabetic neuropathy, unspecified: Secondary | ICD-10-CM | POA: Diagnosis not present

## 2021-09-10 DIAGNOSIS — I2510 Atherosclerotic heart disease of native coronary artery without angina pectoris: Secondary | ICD-10-CM | POA: Diagnosis not present

## 2021-09-10 DIAGNOSIS — E1151 Type 2 diabetes mellitus with diabetic peripheral angiopathy without gangrene: Secondary | ICD-10-CM | POA: Diagnosis not present

## 2021-09-10 DIAGNOSIS — E11621 Type 2 diabetes mellitus with foot ulcer: Secondary | ICD-10-CM | POA: Diagnosis not present

## 2021-09-10 NOTE — Progress Notes (Signed)
[name], Colin A. ([id number]) ?Visit Report for 09/10/2021 ?Chief Complaint Document Details ?Patient Name: Date of Service: ?NA Colin Nephew A. 09/10/2021 12:45 PM ?Medical Record Number: [id number] ?Patient Account Number: [id number] ?Date of Birth/Sex: Treating RN: ?1955/08/11 (66 y.o. M) ?Primary Care Provider: [name] Other Clinician: ?Referring Provider: ?Treating Provider/Extender: [name] ?[name] ?Weeks in Treatment: 2 ?Information Obtained from: Patient ?Chief Complaint ?Patients presents for treatment of an open diabetic ulcer with exposed bone and osteomyelitis ?Electronic Signature(s) ?Signed: 09/10/2021 1:39:22 PM By: Colin Guess MD FACS ?Entered By: [name] on 09/10/2021 13:39:22 ?-------------------------------------------------------------------------------- ?Debridement Details ?Patient Name: Date of Service: ?NA Colin Nephew A. 09/10/2021 12:45 PM ?Medical Record Number: [id number] ?Patient Account Number: [id number] ?Date of Birth/Sex: Treating RN: ?April 10, 1956 (66 y.o. [name] ?Primary Care Provider: [name] Other Clinician: ?Referring Provider: ?Treating Provider/Extender: [name] ?[name] ?Weeks in Treatment: 2 ?Debridement Performed for Assessment: Wound #1 Left,Medial Foot ?Performed By: Physician Colin Guess, MD ?Debridement Type: Debridement ?Severity of Tissue Pre Debridement: Bone involvement without necrosis ?Level of Consciousness (Pre-procedure): Awake and Alert ?Pre-procedure Verification/Time Out Yes - 13:28 ?Taken: ?Start Time: 13:28 ?T Area Debrided (L x W): ?otal 6 (cm) x 4 (cm) = 24 (cm?) ?Tissue and other material debrided: Non-Viable, Slough, Slough ?Level: Non-Viable Tissue ?Debridement Description: Selective/Open Wound ?Instrument: Curette ?Bleeding: Moderate ?Hemostasis Achieved: Silver Nitrate ?End Time: 13:30 ?Procedural Pain: 0 ?Post Procedural Pain: 0 ?Response to Treatment: Procedure was tolerated well ?Level of  Consciousness (Post- Awake and Alert ?procedure): ?Post Debridement Measurements of Total Wound ?Length: (cm) 6 ?Width: (cm) 4.1 ?Depth: (cm) 1.2 ?Volume: (cm?) 23.185 ?Character of Wound/Ulcer Post Debridement: Stable ?Severity of Tissue Post Debridement: Bone involvement without necrosis ?Post Procedure Diagnosis ?Same as Pre-procedure ?Electronic Signature(s) ?Signed: 09/10/2021 3:39:29 PM By: Colin Guess MD FACS ?Signed: 09/10/2021 4:27:25 PM By: Zandra Abts RN, BSN ?Entered By: Zandra Abts on 09/10/2021 13:33:00 ?-------------------------------------------------------------------------------- ?HPI Details ?Patient Name: Date of Service: ?NA Colin Nephew A. 09/10/2021 12:45 PM ?Medical Record Number: [id number] ?Patient Account Number: [id number] ?Date of Birth/Sex: Treating RN: ?01/21/1956 (66 y.o. M) ?Primary Care Provider: [name] Other Clinician: ?Referring Provider: ?Treating Provider/Extender: [name] ?[name] ?Weeks in Treatment: 2 ?History of Present Illness ?HPI Description: ADMISSION ?08/25/2021 ?This is a 66 year old man who initially presented to his primary care provider in September 2022 with pain in his left foot. He was sent for an x-ray and while ?the x-ray was being performed, the tech pointed out a wound on his foot that the patient was not aware existed. He does have type 2 diabetes with significant ?neuropathy. His diabetes is suboptimally controlled with his most recent A1c being 8.5. He also has a history of coronary artery disease status post three- ?vessel CABG. he was initially seen by orthopedics, but they referred him to [facility]. He has undergone at least 7 ?operations/debridements and several applications of skin substitute under the care of podiatry. He has been in a wound VAC for much of this time. His most ?recent procedure was July 28, 2021. A portion of the talus was biopsied and was found to be consistent with osteomyelitis.  Culture also returned positive ?for corynebacterium. He was seen on August 16, 2021 by infectious disease. A PICC line has been placed and he will be receiving a 6-week course of IV ?daptomycin and cefepime. In October 2022, he underwent lower extremity vascular studies. Results are copied here: ?Right: Resting right ankle-brachial index is within  normal range. No ?evidence of significant right lower extremity arterial disease. The right ?toe-brachial index is abnormal. ?Left: Resting left ankle-brachial index indicates mild left lower ?extremity arterial disease. The left toe-brachial index is abnormal. ?He has not been seen by vascular surgery despite these findings. ?He presented to clinic today in a cam boot and is using a knee scooter to offload. Wound VAC was in place. Once this was removed, a large ulcer was ?identified on the left midfoot/ankle. Bone is frankly exposed. There is no malodorous or purulent drainage. There is some granulation tissue over the central ?portion of the exposed bone. There is a tunnel that extends posteriorly for roughly 10 cm. ?It has been discussed with him by multiple providers that he is at very high risk of losing his lower leg because of this wound. He is extremely eager to avoid ?this outcome and is here today to review his options as well as receive ongoing wound care. ?09/03/2021: Here for reevaluation of his wound. There does not appear to have been any substantial improvement overall since our last visit. He has been in a ?wound VAC with white foam overlying the exposed bone. We are working on getting him approved for hyperbaric oxygen therapy. ?09/10/2021: We are in the process of getting him cleared to begin hyperbaric oxygen therapy. He still needs to obtain a chest x-ray. Although the wound ?measurements are roughly the same, I think the overall appearance of the wound is better. The exposed bone has a bit more granulation tissue covering it. He ?has not received a  vascular surgery appointment to reevaluate his flow to the wound. ?Electronic Signature(s) ?Signed: 09/10/2021 1:40:28 PM By: Colin Guess MD FACS ?Entered By: [name] on 09/10/2021 13:40:28 ?-------------------------------------------------------------------------------- ?Physical Exam Details ?Patient Name: Date of Service: ?NA Colin Nephew A. 09/10/2021 12:45 PM ?Medical Record Number: [id number] ?Patient Account Number: [id number] ?Date of Birth/Sex: Treating RN: ?07-23-1955 (66 y.o. M) ?Primary Care Provider: [name] Other Clinician: ?Referring Provider: ?Treating Provider/Extender: [name] ?[name] ?Weeks in Treatment: 2 ?Constitutional ?. . . . . No acute distress. ?Respiratory ?Normal work of breathing on room air. ?Notes ?09/10/2021: Although the wound measurements are about the same today, I think there is an overall improvement in the appearance of the wound. The bone has ?more granulation tissue overlying it. There is a small amount of slough. No odor or purulent drainage. ?Electronic Signature(s) ?Signed: 09/10/2021 1:42:26 PM By: Colin Guess MD FACS ?Entered By: [name] on 09/10/2021 13:42:25 ?-------------------------------------------------------------------------------- ?Physician Orders Details ?Patient Name: Date of Service: ?NA Colin Nephew A. 09/10/2021 12:45 PM ?Medical Record Number: [id number] ?Patient Account Number: [id number] ?Date of Birth/Sex: Treating RN: ?10/11/1955 (66 y.o. [name] ?Primary Care Provider: [name] Other Clinician: ?Referring Provider: ?Treating Provider/Extender: [name] ?[name] ?Weeks in Treatment: 2 ?Verbal / Phone Orders: No ?Diagnosis Coding ?ICD-10 Coding ?Code Description ?I10.301 Other chronic osteomyelitis, left ankle and foot ?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 ?E11.621 Type 2 diabetes mellitus with foot ulcer ?I25.10 Atherosclerotic heart disease of native  coronary artery without angina pectoris ?Follow-up Appointments ?ppointment in 1 week. - Dr. Lady Gary - Room 2 ?Return A ?Negative Presssure Wound Therapy ?Wound Vac to wound continuously at 161mm/hg pressure - Apply saline mo

## 2021-09-10 NOTE — Progress Notes (Addendum)
[name], Colin A. ([id number]) ?Visit Report for 09/10/2021 ?Arrival Information Details ?Patient Name: Date of Service: ?[name] 09/10/2021 12:45 PM ?Medical Record Number: [id number] ?Patient Account Number: [id number] ?Date of Birth/Sex: Treating RN: ?Feb 13, 1956 (66 y.o. M) ?Primary Care Providence Stivers: [name] Other Clinician: ?Referring Christoffer Currier: ?Treating Benyamin Jeff/Extender: [name] ?[name] ?Weeks in Treatment: 2 ?Visit Information History Since Last Visit ?Added or deleted any medications: No ?Patient Arrived: Knee Scooter ?Any new allergies or adverse reactions: No ?Arrival Time: 12:59 ?Had a fall or experienced change in No ?Accompanied By: self ?activities of daily living that may affect ?Transfer Assistance: None ?risk of falls: ?Patient Identification Verified: Yes ?Signs or symptoms of abuse/neglect since last visito No ?Secondary Verification Process Completed: Yes ?Hospitalized since last visit: No ?Patient Requires Transmission-Based Precautions: No ?Implantable device outside of the clinic excluding No ?Patient Has Alerts: Yes ?cellular tissue based products placed in the center ?Patient Alerts: Patient on Blood Thinner since last visit: ?PICC Line Right Arm Has Dressing in Place as Prescribed: Yes ?R ABI=1.1 TBI=.55 ?Pain Present Now: No ?L ABI=.86 TBI=.36 ?Electronic Signature(s) ?Signed: 09/10/2021 1:14:45 PM By: Sandre Kitty ?Entered By: Sandre Kitty on 09/10/2021 13:00:35 ?-------------------------------------------------------------------------------- ?Encounter Discharge Information Details ?Patient Name: Date of Service: ?[name] 09/10/2021 12:45 PM ?Medical Record Number: [id number] ?Patient Account Number: [id number] ?Date of Birth/Sex: Treating RN: ?1956-01-23 (66 y.o. [name] ?Primary Care Magdalynn Davilla: [name] Other Clinician: ?Referring Sylwia Cuervo: ?Treating Azalyn Sliwa/Extender: [name] ?[name] ?Weeks in Treatment: 2 ?Encounter  Discharge Information Items Post Procedure Vitals ?Discharge Condition: Stable ?Temperature (F): 97.9 ?Ambulatory Status: Knee Scooter ?Pulse (bpm): 84 ?Discharge Destination: Home ?Respiratory Rate (breaths/min): 18 ?Transportation: Private Auto ?Blood Pressure (mmHg): 149/76 ?Accompanied By: alone ?Schedule Follow-up Appointment: Yes ?Clinical Summary of Care: Patient Declined ?Electronic Signature(s) ?Signed: 09/10/2021 4:27:25 PM By: Levan Hurst RN, BSN ?Entered By: Levan Hurst on 09/10/2021 15:54:47 ?-------------------------------------------------------------------------------- ?Lower Extremity Assessment Details ?Patient Name: ?Date of Service: ?[name] 09/10/2021 12:45 PM ?Medical Record Number: [id number] ?Patient Account Number: [id number] ?Date of Birth/Sex: ?Treating RN: ?12-16-1955 (66 y.o. [name] ?Primary Care Anyssa Sharpless: [name] ?Other Clinician: ?Referring Peniel Hass: ?Treating Greenlee Ancheta/Extender: [name] ?[name] ?Weeks in Treatment: 2 ?Edema Assessment ?Assessed: [Left: No] [Right: No] ?E[Left: dema] [Right: :] ?Calf ?Left: Right: ?Point of Measurement: From Medial Instep 36 cm ?Ankle ?Left: Right: ?Point of Measurement: From Medial Instep 21.5 cm ?Vascular Assessment ?Pulses: ?Dorsalis Pedis ?Palpable: [Left:Yes] ?Electronic Signature(s) ?Signed: 09/10/2021 4:27:25 PM By: Levan Hurst RN, BSN ?Entered By: Levan Hurst on 09/10/2021 13:24:00 ?-------------------------------------------------------------------------------- ?Multi Wound Chart Details ?Patient Name: ?Date of Service: ?[name] 09/10/2021 12:45 PM ?Medical Record Number: [id number] ?Patient Account Number: [id number] ?Date of Birth/Sex: ?Treating RN: ?01-01-1956 (66 y.o. M) ?Primary Care Kaydince Towles: [name] ?Other Clinician: ?Referring Infant Doane: ?Treating Roda Lauture/Extender: [name] ?[name] ?Weeks in Treatment: 2 ?Vital Signs ?Height(in): 74 ?Capillary Blood Glucose(mg/dl):  128 ?Weight(lbs): 186 ?Pulse(bpm): 84 ?Body Mass Index(BMI): 23.9 ?Blood Pressure(mmHg): 149/76 ?Temperature(??F): 97.9 ?Respiratory Rate(breaths/min): 18 ?Photos: [1:Left, Medial Foot] [N/A:N/A N/A] ?Wound Location: [1:Gradually Appeared] [N/A:N/A] ?Wounding Event: [1:Diabetic Wound/Ulcer of the Lower] [N/A:N/A] ?Primary Etiology: [1:Extremity Cataracts, Coronary Artery Disease, N/A] ?Comorbid History: [1:Hypertension, Myocardial Infarction, Peripheral Arterial Disease, Type II Diabetes, Osteomyelitis, Neuropathy 02/22/2021] [N/A:N/A] ?Date Acquired: [1:2] [N/A:N/A] ?Weeks of Treatment: [1:Open] [N/A:N/A] ?Wound Status: [1:No] [N/A:N/A] ?Wound Recurrence: [1:6x4.1x1.2] [N/A:N/A] ?Measurements L x W x D (cm) [1:19.321] [N/A:N/A] ?A (cm?) : ?rea [1:23.185] [N/A:N/A] ?Volume (cm?) : [  1:-36.70%] [N/A:N/A] ?% Reduction in A [1:rea: -64.00%] [N/A:N/A] ?% Reduction in Volume: [1:10] ?Starting Position 1 (o'clock): [1:2] ?Ending Position 1 (o'clock): [1:3.4] ?Maximum Distance 1 (cm): [1:Yes] [N/A:N/A] ?Undermining: [1:Grade 3] [N/A:N/A] ?Classification: [1:Medium] [N/A:N/A] ?Exudate A mount: [1:Sanguinous] [N/A:N/A] ?Exudate Type: [1:red] [N/A:N/A] ?Exudate Color: [1:Distinct, outline attached] [N/A:N/A] ?Wound Margin: [1:Medium (34-66%)] [N/A:N/A] ?Granulation A mount: [1:Red, Pink] [N/A:N/A] ?Granulation Quality: [1:Medium (34-66%)] [N/A:N/A] ?Necrotic A mount: ?[1:Fat Layer (Subcutaneous Tissue): Yes N/A] ?Exposed Structures: ?[1:Colin: Yes Fascia: No Tendon: No Muscle: No Joint: No None] [N/A:N/A] ?Epithelialization: [1:Debridement - Selective/Open Wound N/A] ?Debridement: ?Pre-procedure Verification/Time Out 13:28 [N/A:N/A] ?Taken: [1:Slough] [N/A:N/A] ?Tissue Debrided: [1:Non-Viable Tissue] [N/A:N/A] ?Level: [1:24] [N/A:N/A] ?Debridement A (sq cm): [1:rea Curette] [N/A:N/A] ?Instrument: [1:Moderate] [N/A:N/A] ?Bleeding: [1:Silver Nitrate] [N/A:N/A] ?Hemostasis A chieved: [1:0] [N/A:N/A] ?Procedural Pain: [1:0]  [N/A:N/A] ?Post Procedural Pain: [1:Procedure was tolerated well] [N/A:N/A] ?Debridement Treatment Response: [1:6x4.1x1.2] [N/A:N/A] ?Post Debridement Measurements L x ?W x D (cm) [1:23.185] [N/A:N/A] ?Post Debridement Volume: (cm?) [1:Debridement] [N/A:N/A] ?Treatment Notes ?Electronic Signature(s) ?Signed: 09/10/2021 1:38:58 PM By: [name] MD FACS ?Entered By: [name] on 09/10/2021 13:38:58 ?-------------------------------------------------------------------------------- ?Multi-Disciplinary Care Plan Details ?Patient Name: ?Date of Service: ?[name] 09/10/2021 12:45 PM ?Medical Record Number: [id number] ?Patient Account Number: [id number] ?Date of Birth/Sex: ?Treating RN: ?[birth year] (66 y.o. [name] ?Primary Care Aarron Wierzbicki: [name] ?Other Clinician: ?Referring Drexel Ivey: ?Treating Delfin Squillace/Extender: [name] ?[name] ?Weeks in Treatment: 2 ?Active Inactive ?Nutrition ?Nursing Diagnoses: ?Impaired glucose control: actual or potential ?Goals: ?Patient/caregiver verbalizes understanding of need to maintain therapeutic glucose control per primary care physician ?Date Initiated: 08/25/2021 ?Target Resolution Date: 09/22/2021 ?Goal Status: Active ?Interventions: ?Assess HgA1c results as ordered upon admission and as needed ?Provide education on elevated blood sugars and impact on wound healing ?Notes: ?Wound/Skin Impairment ?Nursing Diagnoses: ?Impaired tissue integrity ?Goals: ?Patient/caregiver will verbalize understanding of skin care regimen ?Date Initiated: 08/25/2021 ?Target Resolution Date: 09/22/2021 ?Goal Status: Active ?Ulcer/skin breakdown will have a volume reduction of 30% by week 4 ?Date Initiated: 08/25/2021 ?Target Resolution Date: 09/22/2021 ?Goal Status: Active ?Interventions: ?Assess patient/caregiver ability to obtain necessary supplies ?Assess patient/caregiver ability to perform ulcer/skin care regimen upon admission and as needed ?Assess ulceration(s)  every visit ?Provide education on ulcer and skin care ?Treatment Activities: ?Topical wound management initiated : 08/25/2021 ?Notes: ?Electronic Signature(s) ?Signed: 09/10/2021 4:27:25 PM By: Levan Hurst RN, BSN ?Enter

## 2021-09-11 DIAGNOSIS — I739 Peripheral vascular disease, unspecified: Secondary | ICD-10-CM | POA: Diagnosis not present

## 2021-09-11 DIAGNOSIS — T8131XA Disruption of external operation (surgical) wound, not elsewhere classified, initial encounter: Secondary | ICD-10-CM | POA: Diagnosis not present

## 2021-09-12 DIAGNOSIS — I739 Peripheral vascular disease, unspecified: Secondary | ICD-10-CM | POA: Diagnosis not present

## 2021-09-12 DIAGNOSIS — T8131XA Disruption of external operation (surgical) wound, not elsewhere classified, initial encounter: Secondary | ICD-10-CM | POA: Diagnosis not present

## 2021-09-13 DIAGNOSIS — Z7984 Long term (current) use of oral hypoglycemic drugs: Secondary | ICD-10-CM | POA: Diagnosis not present

## 2021-09-13 DIAGNOSIS — Z9181 History of falling: Secondary | ICD-10-CM | POA: Diagnosis not present

## 2021-09-13 DIAGNOSIS — I739 Peripheral vascular disease, unspecified: Secondary | ICD-10-CM | POA: Diagnosis not present

## 2021-09-13 DIAGNOSIS — Z951 Presence of aortocoronary bypass graft: Secondary | ICD-10-CM | POA: Diagnosis not present

## 2021-09-13 DIAGNOSIS — E08621 Diabetes mellitus due to underlying condition with foot ulcer: Secondary | ICD-10-CM | POA: Diagnosis not present

## 2021-09-13 DIAGNOSIS — E785 Hyperlipidemia, unspecified: Secondary | ICD-10-CM | POA: Diagnosis not present

## 2021-09-13 DIAGNOSIS — M1990 Unspecified osteoarthritis, unspecified site: Secondary | ICD-10-CM | POA: Diagnosis not present

## 2021-09-13 DIAGNOSIS — I2510 Atherosclerotic heart disease of native coronary artery without angina pectoris: Secondary | ICD-10-CM | POA: Diagnosis not present

## 2021-09-13 DIAGNOSIS — Z7982 Long term (current) use of aspirin: Secondary | ICD-10-CM | POA: Diagnosis not present

## 2021-09-13 DIAGNOSIS — D649 Anemia, unspecified: Secondary | ICD-10-CM | POA: Diagnosis not present

## 2021-09-13 DIAGNOSIS — L97401 Non-pressure chronic ulcer of unspecified heel and midfoot limited to breakdown of skin: Secondary | ICD-10-CM | POA: Diagnosis not present

## 2021-09-13 DIAGNOSIS — K219 Gastro-esophageal reflux disease without esophagitis: Secondary | ICD-10-CM | POA: Diagnosis not present

## 2021-09-13 DIAGNOSIS — Z794 Long term (current) use of insulin: Secondary | ICD-10-CM | POA: Diagnosis not present

## 2021-09-13 DIAGNOSIS — I10 Essential (primary) hypertension: Secondary | ICD-10-CM | POA: Diagnosis not present

## 2021-09-13 DIAGNOSIS — E11610 Type 2 diabetes mellitus with diabetic neuropathic arthropathy: Secondary | ICD-10-CM | POA: Diagnosis not present

## 2021-09-13 DIAGNOSIS — E1140 Type 2 diabetes mellitus with diabetic neuropathy, unspecified: Secondary | ICD-10-CM | POA: Diagnosis not present

## 2021-09-13 DIAGNOSIS — I252 Old myocardial infarction: Secondary | ICD-10-CM | POA: Diagnosis not present

## 2021-09-13 DIAGNOSIS — T8131XA Disruption of external operation (surgical) wound, not elsewhere classified, initial encounter: Secondary | ICD-10-CM | POA: Diagnosis not present

## 2021-09-13 DIAGNOSIS — E11621 Type 2 diabetes mellitus with foot ulcer: Secondary | ICD-10-CM | POA: Diagnosis not present

## 2021-09-13 DIAGNOSIS — L97522 Non-pressure chronic ulcer of other part of left foot with fat layer exposed: Secondary | ICD-10-CM | POA: Diagnosis not present

## 2021-09-14 ENCOUNTER — Telehealth: Payer: Self-pay

## 2021-09-14 DIAGNOSIS — I739 Peripheral vascular disease, unspecified: Secondary | ICD-10-CM | POA: Diagnosis not present

## 2021-09-14 DIAGNOSIS — T8131XA Disruption of external operation (surgical) wound, not elsewhere classified, initial encounter: Secondary | ICD-10-CM | POA: Diagnosis not present

## 2021-09-14 NOTE — Telephone Encounter (Signed)
Please see note below. 

## 2021-09-14 NOTE — Telephone Encounter (Signed)
-----   Message from [place] sent at 09/14/2021 11:48 AM EDT ----- ?Good morning! Patients wife called in regards to his lab results to ensure that we are actually getting and that they results are coming back good. Said the home health nurse wanted to make sure that everything was looking good and if anything else was needed. Thanks!  ?The wife said you can contact her at: [phone] ? ?

## 2021-09-15 DIAGNOSIS — K219 Gastro-esophageal reflux disease without esophagitis: Secondary | ICD-10-CM | POA: Diagnosis not present

## 2021-09-15 DIAGNOSIS — Z7984 Long term (current) use of oral hypoglycemic drugs: Secondary | ICD-10-CM | POA: Diagnosis not present

## 2021-09-15 DIAGNOSIS — I2510 Atherosclerotic heart disease of native coronary artery without angina pectoris: Secondary | ICD-10-CM | POA: Diagnosis not present

## 2021-09-15 DIAGNOSIS — L97522 Non-pressure chronic ulcer of other part of left foot with fat layer exposed: Secondary | ICD-10-CM | POA: Diagnosis not present

## 2021-09-15 DIAGNOSIS — E11610 Type 2 diabetes mellitus with diabetic neuropathic arthropathy: Secondary | ICD-10-CM | POA: Diagnosis not present

## 2021-09-15 DIAGNOSIS — D649 Anemia, unspecified: Secondary | ICD-10-CM | POA: Diagnosis not present

## 2021-09-15 DIAGNOSIS — I252 Old myocardial infarction: Secondary | ICD-10-CM | POA: Diagnosis not present

## 2021-09-15 DIAGNOSIS — E785 Hyperlipidemia, unspecified: Secondary | ICD-10-CM | POA: Diagnosis not present

## 2021-09-15 DIAGNOSIS — E1140 Type 2 diabetes mellitus with diabetic neuropathy, unspecified: Secondary | ICD-10-CM | POA: Diagnosis not present

## 2021-09-15 DIAGNOSIS — Z951 Presence of aortocoronary bypass graft: Secondary | ICD-10-CM | POA: Diagnosis not present

## 2021-09-15 DIAGNOSIS — Z7982 Long term (current) use of aspirin: Secondary | ICD-10-CM | POA: Diagnosis not present

## 2021-09-15 DIAGNOSIS — T8131XA Disruption of external operation (surgical) wound, not elsewhere classified, initial encounter: Secondary | ICD-10-CM | POA: Diagnosis not present

## 2021-09-15 DIAGNOSIS — I739 Peripheral vascular disease, unspecified: Secondary | ICD-10-CM | POA: Diagnosis not present

## 2021-09-15 DIAGNOSIS — Z794 Long term (current) use of insulin: Secondary | ICD-10-CM | POA: Diagnosis not present

## 2021-09-15 DIAGNOSIS — M1990 Unspecified osteoarthritis, unspecified site: Secondary | ICD-10-CM | POA: Diagnosis not present

## 2021-09-15 DIAGNOSIS — E11621 Type 2 diabetes mellitus with foot ulcer: Secondary | ICD-10-CM | POA: Diagnosis not present

## 2021-09-15 DIAGNOSIS — I10 Essential (primary) hypertension: Secondary | ICD-10-CM | POA: Diagnosis not present

## 2021-09-15 DIAGNOSIS — Z9181 History of falling: Secondary | ICD-10-CM | POA: Diagnosis not present

## 2021-09-15 NOTE — Telephone Encounter (Signed)
I spoke to the patient and patient's wife regarding labs. Advised them that patient's inflammatory markers remain elevated, to continue wound care and IV antibiotics. Patient also scheduled for follow up visit for 10/05/21 which is after he completes IV antibiotics. Would you like him to be seen sooner? ?

## 2021-09-16 DIAGNOSIS — I739 Peripheral vascular disease, unspecified: Secondary | ICD-10-CM | POA: Diagnosis not present

## 2021-09-16 DIAGNOSIS — S91302A Unspecified open wound, left foot, initial encounter: Secondary | ICD-10-CM | POA: Diagnosis not present

## 2021-09-16 DIAGNOSIS — T8131XA Disruption of external operation (surgical) wound, not elsewhere classified, initial encounter: Secondary | ICD-10-CM | POA: Diagnosis not present

## 2021-09-16 NOTE — Telephone Encounter (Signed)
Got it.

## 2021-09-16 NOTE — Telephone Encounter (Signed)
Patient scheduled for 09/27/21 with Dr. Daiva Eves. ?

## 2021-09-17 ENCOUNTER — Encounter (HOSPITAL_BASED_OUTPATIENT_CLINIC_OR_DEPARTMENT_OTHER): Payer: BC Managed Care – PPO | Attending: General Surgery | Admitting: General Surgery

## 2021-09-17 ENCOUNTER — Other Ambulatory Visit (HOSPITAL_COMMUNITY): Payer: Self-pay | Admitting: General Surgery

## 2021-09-17 ENCOUNTER — Ambulatory Visit (HOSPITAL_COMMUNITY)
Admission: RE | Admit: 2021-09-17 | Discharge: 2021-09-17 | Disposition: A | Discharge status: Home / Self Care | Payer: BC Managed Care – PPO | Source: Ambulatory Visit | Attending: General Surgery | Admitting: General Surgery

## 2021-09-17 DIAGNOSIS — E11621 Type 2 diabetes mellitus with foot ulcer: Secondary | ICD-10-CM | POA: Insufficient documentation

## 2021-09-17 DIAGNOSIS — Z9289 Personal history of other medical treatment: Secondary | ICD-10-CM

## 2021-09-17 DIAGNOSIS — L97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: Secondary | ICD-10-CM | POA: Diagnosis not present

## 2021-09-17 DIAGNOSIS — I739 Peripheral vascular disease, unspecified: Secondary | ICD-10-CM | POA: Diagnosis not present

## 2021-09-17 DIAGNOSIS — E1140 Type 2 diabetes mellitus with diabetic neuropathy, unspecified: Secondary | ICD-10-CM | POA: Diagnosis not present

## 2021-09-17 DIAGNOSIS — Z951 Presence of aortocoronary bypass graft: Secondary | ICD-10-CM | POA: Diagnosis not present

## 2021-09-17 DIAGNOSIS — E11610 Type 2 diabetes mellitus with diabetic neuropathic arthropathy: Secondary | ICD-10-CM | POA: Diagnosis not present

## 2021-09-17 DIAGNOSIS — M86672 Other chronic osteomyelitis, left ankle and foot: Secondary | ICD-10-CM | POA: Diagnosis not present

## 2021-09-17 DIAGNOSIS — T8131XA Disruption of external operation (surgical) wound, not elsewhere classified, initial encounter: Secondary | ICD-10-CM | POA: Diagnosis not present

## 2021-09-17 DIAGNOSIS — I2510 Atherosclerotic heart disease of native coronary artery without angina pectoris: Secondary | ICD-10-CM | POA: Insufficient documentation

## 2021-09-17 DIAGNOSIS — Z452 Encounter for adjustment and management of vascular access device: Secondary | ICD-10-CM | POA: Diagnosis not present

## 2021-09-17 NOTE — Progress Notes (Signed)
FIELDING, MAULT A. ([id number]) ?Visit Report for 09/17/2021 ?Chief Complaint Document Details ?Patient Name: Date of Service: ?NA [name] 09/17/2021 2:30 PM ?Medical Record Number: [id number] ?Patient Account Number: [id number] ?Date of Birth/Sex: Treating RN: ?Nov 27, 1955 (66 y.o. Judie Petit) Karie Schwalbe ?Primary Care Provider: [name] Other Clinician: ?Referring Provider: ?Treating Provider/Extender: [name] ?[name] ?Weeks in Treatment: 3 ?Information Obtained from: Patient ?Chief Complaint ?Patients presents for treatment of an open diabetic ulcer with exposed bone and osteomyelitis ?Electronic Signature(s) ?Signed: 09/17/2021 4:02:39 PM By: [name] MD FACS ?Entered By: [name] on 09/17/2021 16:02:39 ?-------------------------------------------------------------------------------- ?Debridement Details ?Patient Name: Date of Service: ?NA [name] 09/17/2021 2:30 PM ?Medical Record Number: [id number] ?Patient Account Number: [id number] ?Date of Birth/Sex: Treating RN: ?[birth year] (66 y.o. [name] ?Primary Care Provider: [name] Other Clinician: ?Referring Provider: ?Treating Provider/Extender: [name] ?[name] ?Weeks in Treatment: 3 ?Debridement Performed for Assessment: Wound #1 Left,Medial Foot ?Performed By: Physician [name], MD ?Debridement Type: Debridement ?Severity of Tissue Pre Debridement: Necrosis of bone ?Level of Consciousness (Pre-procedure): Awake and Alert ?Pre-procedure Verification/Time Out Yes - 15:15 ?Taken: ?Start Time: 15:15 ?Pain Control: Lidocaine 4% T opical Solution ?T Area Debrided (L x W): ?otal 2.5 (cm) x 3 (cm) = 7.5 (cm?) ?Tissue and other material debrided: Viable, Non-Viable, Slough, Subcutaneous, Slough ?Level: Skin/Subcutaneous Tissue ?Debridement Description: Excisional ?Instrument: Curette ?Bleeding: Minimum ?Hemostasis Achieved: Pressure ?Procedural Pain: 0 ?Post Procedural Pain: 0 ?Response to Treatment:  Procedure was tolerated well ?Level of Consciousness (Post- Awake and Alert ?procedure): ?Post Debridement Measurements of Total Wound ?Length: (cm) 6 ?Width: (cm) 4.1 ?Depth: (cm) 1 ?Volume: (cm?) 19.321 ?Character of Wound/Ulcer Post Debridement: Requires Further Debridement ?Severity of Tissue Post Debridement: Necrosis of bone ?Post Procedure Diagnosis ?Same as Pre-procedure ?Electronic Signature(s) ?Signed: 09/17/2021 4:20:58 PM By: [name] MD FACS ?Signed: 09/17/2021 5:30:13 PM By: Zenaida Deed RN, BSN ?Entered By: Zenaida Deed on 09/17/2021 15:19:00 ?-------------------------------------------------------------------------------- ?HPI Details ?Patient Name: Date of Service: ?NA [name] 09/17/2021 2:30 PM ?Medical Record Number: [id number] ?Patient Account Number: [id number] ?Date of Birth/Sex: Treating RN: ?05-05-1956 (66 y.o. Judie Petit) Karie Schwalbe ?Primary Care Provider: [name] Other Clinician: ?Referring Provider: ?Treating Provider/Extender: [name] ?[name] ?Weeks in Treatment: 3 ?History of Present Illness ?HPI Description: ADMISSION ?08/25/2021 ?This is a 66 year old man who initially presented to his primary care provider in September 2022 with pain in his left foot. He was sent for an x-ray and while ?the x-ray was being performed, the tech pointed out a wound on his foot that the patient was not aware existed. He does have type 2 diabetes with significant ?neuropathy. His diabetes is suboptimally controlled with his most recent A1c being 8.5. He also has a history of coronary artery disease status post three- ?vessel CABG. he was initially seen by orthopedics, but they referred him to [facility]. He has undergone at least 7 ?operations/debridements and several applications of skin substitute under the care of podiatry. He has been in a wound VAC for much of this time. His most ?recent procedure was July 28, 2021. A portion of the talus was biopsied  and was found to be consistent with osteomyelitis. Culture also returned positive ?for corynebacterium. He was seen on August 16, 2021 by infectious disease. A PICC line has been placed and he will be receiving a 6-week course of IV ?daptomycin and cefepime. In October 2022, he underwent lower extremity vascular studies. Results are copied  here: ?Right: Resting right ankle-brachial index is within normal range. No ?evidence of significant right lower extremity arterial disease. The right ?toe-brachial index is abnormal. ?Left: Resting left ankle-brachial index indicates mild left lower ?extremity arterial disease. The left toe-brachial index is abnormal. ?He has not been seen by vascular surgery despite these findings. ?He presented to clinic today in a cam boot and is using a knee scooter to offload. Wound VAC was in place. Once this was removed, a large ulcer was ?identified on the left midfoot/ankle. Bone is frankly exposed. There is no malodorous or purulent drainage. There is some granulation tissue over the central ?portion of the exposed bone. There is a tunnel that extends posteriorly for roughly 10 cm. ?It has been discussed with him by multiple providers that he is at very high risk of losing his lower leg because of this wound. He is extremely eager to avoid ?this outcome and is here today to review his options as well as receive ongoing wound care. ?09/03/2021: Here for reevaluation of his wound. There does not appear to have been any substantial improvement overall since our last visit. He has been in a ?wound VAC with white foam overlying the exposed bone. We are working on getting him approved for hyperbaric oxygen therapy. ?09/10/2021: We are in the process of getting him cleared to begin hyperbaric oxygen therapy. He still needs to obtain a chest x-ray. Although the wound ?measurements are roughly the same, I think the overall appearance of the wound is better. The exposed bone has a bit more  granulation tissue covering it. He ?has not received a vascular surgery appointment to reevaluate his flow to the wound. ?09/17/2021: He has been approved for hyperbaric oxygen therapy and completed his chest x-ray, which I reviewed and it appears normal. The tunnels at the 12 ?and 10:00 positions are smaller. There is more granulation tissue covering the exposed bone and the undermining has decreased. He still has not received a ?vascular surgery appointment. ?Electronic Signature(s) ?Signed: 09/17/2021 4:04:41 PM By: [name] MD FACS ?Entered By: [name] on 09/17/2021 16:04:40 ?-------------------------------------------------------------------------------- ?Physical Exam Details ?Patient Name: Date of Service: ?NA [name] 09/17/2021 2:30 PM ?Medical Record Number: [id number] ?Patient Account Number: [id number] ?Date of Birth/Sex: Treating RN: ?05-24-1956 (66 y.o. Judie Petit) Karie Schwalbe ?Primary Care Provider: [name] Other Clinician: ?Referring Provider: ?Treating Provider/Extender: [name] ?[name] ?Weeks in Treatment: 3 ?Constitutional ?Slightly hypertensive. . . . No acute distress. ?Respiratory ?Normal work of breathing on room air. ?Notes ?09/17/2021: The wound appearance continues to improve. There is more granulation tissue over the bone, but there is still exposed bone present. There is some ?necrotic tissue at the 12 o'clock position with a small tunnel. The larger tunnel at the 10 o'clock position is substantially shorter. Undermining has decreased. All ?drainage is serosanguineous. ?Electronic Signature(s) ?Signed: 09/17/2021 4:06:05 PM By: [name] MD FACS ?Entered By: [name] on 09/17/2021 16:06:05 ?-------------------------------------------------------------------------------- ?Physician Orders Details ?Patient Name: Date of Service: ?NA [name] 09/17/2021 2:30 PM ?Medical Record Number: [id number] ?Patient Account Number: [id number] ?Date of  Birth/Sex: Treating RN: ?02-Jan-1956 (66 y.o. [name] ?Primary Care Provider: [name] Other Clinician: ?Referring Provider: ?Treating Provider/Extender: [name] ?[name] ?Weeks in Treatment: 3 ?Verbal

## 2021-09-17 NOTE — Progress Notes (Signed)
[name], Colin A. ([id number]) ?Visit Report for 09/17/2021 ?Arrival Information Details ?Patient Name: Date of Service: ?Colin [name] 09/17/2021 2:30 PM ?Medical Record Number: [id number] ?Patient Account Number: [id number] ?Date of Birth/Sex: Treating RN: ?04-08-1956 (66 y.o. Judie Petit) Karie Schwalbe ?Primary Care Alliyah Roesler: [name] Other Clinician: ?Referring Ferris Tally: ?Treating Cayleb Jarnigan/Extender: [name] ?[name] ?Weeks in Treatment: 3 ?Visit Information History Since Last Visit ?Added or deleted any medications: No ?Patient Arrived: Knee Scooter ?Any new allergies or adverse reactions: No ?Arrival Time: 14:23 ?Had a fall or experienced change in No ?Accompanied By: wife ?activities of daily living that may affect ?Transfer Assistance: None ?risk of falls: ?Patient Identification Verified: Yes ?Signs or symptoms of abuse/neglect since last visito No ?Secondary Verification Process Completed: Yes ?Hospitalized since last visit: No ?Patient Requires Transmission-Based Precautions: No ?Implantable device outside of the clinic excluding No ?Patient Has Alerts: Yes ?cellular tissue based products placed in the center ?Patient Alerts: Patient on Blood Thinner since last visit: ?PICC Line Right Arm Has Dressing in Place as Prescribed: Yes ?R ABI=1.1 TBI=.55 ?Pain Present Now: No ?L ABI=.86 TBI=.36 ?Electronic Signature(s) ?Signed: 09/17/2021 2:37:03 PM By: Karl Ito ?Entered By: Karl Ito on 09/17/2021 14:26:34 ?-------------------------------------------------------------------------------- ?Encounter Discharge Information Details ?Patient Name: Date of Service: ?Colin [name] 09/17/2021 2:30 PM ?Medical Record Number: [id number] ?Patient Account Number: [id number] ?Date of Birth/Sex: Treating RN: ?Sep 25, 1955 (66 y.o. [name] ?Primary Care Inika Bellanger: [name] Other Clinician: ?Referring Mazi Schuff: ?Treating Mehdi Gironda/Extender: [name] ?[name] ?Weeks in Treatment:  3 ?Encounter Discharge Information Items Post Procedure Vitals ?Discharge Condition: Stable ?Temperature (F): 97.8 ?Ambulatory Status: Knee Scooter ?Pulse (bpm): 84 ?Discharge Destination: Home ?Respiratory Rate (breaths/min): 18 ?Transportation: Private Auto ?Blood Pressure (mmHg): 149/79 ?Accompanied By: spouse ?Schedule Follow-up Appointment: Yes ?Clinical Summary of Care: Patient Declined ?Electronic Signature(s) ?Signed: 09/17/2021 5:30:13 PM By: Zenaida Deed RN, BSN ?Entered By: Zenaida Deed on 09/17/2021 15:57:33 ?-------------------------------------------------------------------------------- ?Lower Extremity Assessment Details ?Patient Name: ?Date of Service: ?Colin [name] 09/17/2021 2:30 PM ?Medical Record Number: [id number] ?Patient Account Number: [id number] ?Date of Birth/Sex: ?Treating RN: ?[birth year] (66 y.o. [name] ?Primary Care Sahiba Granholm: [name] ?Other Clinician: ?Referring Redford Behrle: ?Treating Nakari Bracknell/Extender: [name] ?[name] ?Weeks in Treatment: 3 ?Edema Assessment ?Assessed: [Left: No] [Right: No] ?Edema: [Left: Ye] [Right: s] ?Calf ?Left: Right: ?Point of Measurement: From Medial Instep 36 cm ?Ankle ?Left: Right: ?Point of Measurement: From Medial Instep 23.8 cm ?Vascular Assessment ?Pulses: ?Dorsalis Pedis ?Palpable: [Left:Yes] ?Electronic Signature(s) ?Signed: 09/17/2021 5:30:13 PM By: Zenaida Deed RN, BSN ?Entered By: Zenaida Deed on 09/17/2021 14:50:48 ?-------------------------------------------------------------------------------- ?Multi Wound Chart Details ?Patient Name: ?Date of Service: ?Colin [name] 09/17/2021 2:30 PM ?Medical Record Number: [id number] ?Patient Account Number: [id number] ?Date of Birth/Sex: ?Treating RN: ?16-Nov-1955 (66 y.o. Judie Petit) Karie Schwalbe ?Primary Care Eimi Viney: [name] ?Other Clinician: ?Referring Luis Nickles: ?Treating Dorothy Landgrebe/Extender: [name] ?[name] ?Weeks in Treatment: 3 ?Vital Signs ?Height(in):  74 ?Capillary Blood Glucose(mg/dl): 366 ?Weight(lbs): 186 ?Pulse(bpm): 84 ?Body Mass Index(BMI): 23.9 ?Blood Pressure(mmHg): 149/79 ?Temperature(??F): 97.8 ?Respiratory Rate(breaths/min): 18 ?Photos: [1:Left, Medial Foot] [N/A:N/A N/A] ?Wound Location: [1:Gradually Appeared] [N/A:N/A] ?Wounding Event: [1:Diabetic Wound/Ulcer of the Lower] [N/A:N/A] ?Primary Etiology: [1:Extremity Cataracts, Coronary Artery Disease, N/A] ?Comorbid History: [1:Hypertension, Myocardial Infarction, Peripheral Arterial Disease, Type II Diabetes, Osteomyelitis, Neuropathy 02/22/2021] [N/A:N/A] ?Date Acquired: [1:3] [N/A:N/A] ?Weeks of Treatment: [1:Open] [N/A:N/A] ?Wound Status: [1:No] [N/A:N/A] ?Wound Recurrence: [1:6x4.1x1] [N/A:N/A] ?Measurements L x W x D (cm) [1:19.321] [N/A:N/A] ?A (cm?) : ?rea [  1:19.321] [N/A:N/A] ?Volume (cm?) : [1:-36.70%] [N/A:N/A] ?% Reduction in A [1:rea: -36.70%] [N/A:N/A] ?% Reduction in Volume: [1:9] ?Position 1 (o'clock): [1:1.4] ?Maximum Distance 1 (cm): [1:11] ?Starting Position 1 (o'clock): [1:1] ?Ending Position 1 (o'clock): [1:2.6] ?Maximum Distance 1 (cm): [1:Yes] [N/A:N/A] ?Tunneling: [1:Yes] [N/A:N/A] ?Undermining: [1:Grade 3] [N/A:N/A] ?Classification: [1:Medium] [N/A:N/A] ?Exudate A mount: [1:Serosanguineous] [N/A:N/A] ?Exudate Type: [1:red, brown] [N/A:N/A] ?Exudate Color: [1:Distinct, outline attached] [N/A:N/A] ?Wound Margin: [1:Large (67-100%)] [N/A:N/A] ?Granulation A mount: [1:Red, Pink] [N/A:N/A] ?Granulation Quality: [1:Small (1-33%)] [N/A:N/A] ?Necrotic A mount: ?[1:Fat Layer (Subcutaneous Tissue): Yes N/A] ?Exposed Structures: ?[1:Bone: Yes Fascia: No Tendon: No Muscle: No Joint: No None] [N/A:N/A] ?Epithelialization: [1:Debridement - Excisional] [N/A:N/A] ?Debridement: ?Pre-procedure Verification/Time Out 15:15 [N/A:N/A] ?Taken: [1:Lidocaine 4% Topical Solution] [N/A:N/A] ?Pain Control: [1:Subcutaneous, Slough] [N/A:N/A] ?Tissue Debrided: [1:Skin/Subcutaneous Tissue] [N/A:N/A] ?Level:  [1:7.5] [N/A:N/A] ?Debridement A (sq cm): [1:rea Curette] [N/A:N/A] ?Instrument: [1:Minimum] [N/A:N/A] ?Bleeding: [1:Pressure] [N/A:N/A] ?Hemostasis A chieved: [1:0] [N/A:N/A] ?Procedural Pain: [1:0] [N/A:N/A] ?Post Procedural Pain: [1:Procedure was tolerated well] [N/A:N/A] ?Debridement Treatment Response: [1:6x4.1x1] [N/A:N/A] ?Post Debridement Measurements L x ?W x D (cm) [1:19.321] [N/A:N/A] ?Post Debridement Volume: (cm?) [1:Debridement] [N/A:N/A] ?Procedures Performed: [1:Negative Pressure Wound Therapy Maintenance (NPWT)] ?Treatment Notes ?Wound #1 (Foot) Wound Laterality: Left, Medial ?Cleanser ?Soap and Water ?Discharge Instruction: May shower and wash wound with dial antibacterial soap and water prior to dressing change. ?Wound Cleanser ?Discharge Instruction: Cleanse the wound with wound cleanser prior to applying a clean dressing using gauze sponges, not tissue or cotton balls. ?Peri-Wound Care ?Skin Prep ?Discharge Instruction: Use skin prep as directed ?Topical ?Primary Dressing ?Promogran Prisma Matrix, 4.34 (sq in) (silver collagen) ?Discharge Instruction: Moisten collagen with saline or hydrogel ?Wound Vac ?Secondary Dressing ?Secured With ?Elastic Bandage 4 inch (ACE bandage) ?Discharge Instruction: Secure with ACE bandage as directed. ?Kerlix Roll Sterile, 4.5x3.1 (in/yd) ?Discharge Instruction: Secure with Kerlix as directed. ?Compression Wrap ?Compression Stockings ?Add-Ons ?Electronic Signature(s) ?Signed: 09/17/2021 4:02:29 PM By: [name] MD FACS ?Signed: 09/17/2021 5:16:23 PM By: Karie Schwalbe RN ?Entered By: [name] on 09/17/2021 16:02:29 ?-------------------------------------------------------------------------------- ?Multi-Disciplinary Care Plan Details ?Patient Name: ?Date of Service: ?Colin [name] 09/17/2021 2:30 PM ?Medical Record Number: [id number] ?Patient Account Number: [id number] ?Date of Birth/Sex: ?Treating RN: ?[birth year] (66 y.o. [name] ?Primary  Care Odarius Dines: [name] ?Other Clinician: ?Referring Hayward Rylander: ?Treating Quinteria [name]/Extender: [name] ?[name] ?Weeks in Treatment: 3 ?Multidisciplinary Care Plan reviewed with physician ?Active Inactive

## 2021-09-18 DIAGNOSIS — I739 Peripheral vascular disease, unspecified: Secondary | ICD-10-CM | POA: Diagnosis not present

## 2021-09-18 DIAGNOSIS — T8131XA Disruption of external operation (surgical) wound, not elsewhere classified, initial encounter: Secondary | ICD-10-CM | POA: Diagnosis not present

## 2021-09-19 DIAGNOSIS — T8131XA Disruption of external operation (surgical) wound, not elsewhere classified, initial encounter: Secondary | ICD-10-CM | POA: Diagnosis not present

## 2021-09-19 DIAGNOSIS — I739 Peripheral vascular disease, unspecified: Secondary | ICD-10-CM | POA: Diagnosis not present

## 2021-09-20 ENCOUNTER — Encounter: Payer: BC Managed Care – PPO | Admitting: Podiatry

## 2021-09-20 ENCOUNTER — Encounter (HOSPITAL_BASED_OUTPATIENT_CLINIC_OR_DEPARTMENT_OTHER): Payer: BC Managed Care – PPO | Admitting: General Surgery

## 2021-09-20 DIAGNOSIS — Z951 Presence of aortocoronary bypass graft: Secondary | ICD-10-CM | POA: Diagnosis not present

## 2021-09-20 DIAGNOSIS — I10 Essential (primary) hypertension: Secondary | ICD-10-CM | POA: Diagnosis not present

## 2021-09-20 DIAGNOSIS — M869 Osteomyelitis, unspecified: Secondary | ICD-10-CM | POA: Diagnosis not present

## 2021-09-20 DIAGNOSIS — L97522 Non-pressure chronic ulcer of other part of left foot with fat layer exposed: Secondary | ICD-10-CM | POA: Diagnosis not present

## 2021-09-20 DIAGNOSIS — Z7982 Long term (current) use of aspirin: Secondary | ICD-10-CM | POA: Diagnosis not present

## 2021-09-20 DIAGNOSIS — M86672 Other chronic osteomyelitis, left ankle and foot: Secondary | ICD-10-CM | POA: Diagnosis not present

## 2021-09-20 DIAGNOSIS — E1169 Type 2 diabetes mellitus with other specified complication: Secondary | ICD-10-CM | POA: Diagnosis not present

## 2021-09-20 DIAGNOSIS — E11621 Type 2 diabetes mellitus with foot ulcer: Secondary | ICD-10-CM | POA: Diagnosis not present

## 2021-09-20 DIAGNOSIS — Z452 Encounter for adjustment and management of vascular access device: Secondary | ICD-10-CM | POA: Diagnosis not present

## 2021-09-20 DIAGNOSIS — K219 Gastro-esophageal reflux disease without esophagitis: Secondary | ICD-10-CM | POA: Diagnosis not present

## 2021-09-20 DIAGNOSIS — T8131XA Disruption of external operation (surgical) wound, not elsewhere classified, initial encounter: Secondary | ICD-10-CM | POA: Diagnosis not present

## 2021-09-20 DIAGNOSIS — E785 Hyperlipidemia, unspecified: Secondary | ICD-10-CM | POA: Diagnosis not present

## 2021-09-20 DIAGNOSIS — D649 Anemia, unspecified: Secondary | ICD-10-CM | POA: Diagnosis not present

## 2021-09-20 DIAGNOSIS — I252 Old myocardial infarction: Secondary | ICD-10-CM | POA: Diagnosis not present

## 2021-09-20 DIAGNOSIS — I739 Peripheral vascular disease, unspecified: Secondary | ICD-10-CM | POA: Diagnosis not present

## 2021-09-20 DIAGNOSIS — L97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: Secondary | ICD-10-CM | POA: Diagnosis not present

## 2021-09-20 DIAGNOSIS — E1140 Type 2 diabetes mellitus with diabetic neuropathy, unspecified: Secondary | ICD-10-CM | POA: Diagnosis not present

## 2021-09-20 DIAGNOSIS — Z7984 Long term (current) use of oral hypoglycemic drugs: Secondary | ICD-10-CM | POA: Diagnosis not present

## 2021-09-20 DIAGNOSIS — Z792 Long term (current) use of antibiotics: Secondary | ICD-10-CM | POA: Diagnosis not present

## 2021-09-20 DIAGNOSIS — E11610 Type 2 diabetes mellitus with diabetic neuropathic arthropathy: Secondary | ICD-10-CM | POA: Diagnosis not present

## 2021-09-20 DIAGNOSIS — Z794 Long term (current) use of insulin: Secondary | ICD-10-CM | POA: Diagnosis not present

## 2021-09-20 DIAGNOSIS — I2510 Atherosclerotic heart disease of native coronary artery without angina pectoris: Secondary | ICD-10-CM | POA: Diagnosis not present

## 2021-09-20 DIAGNOSIS — M1990 Unspecified osteoarthritis, unspecified site: Secondary | ICD-10-CM | POA: Diagnosis not present

## 2021-09-20 DIAGNOSIS — L97509 Non-pressure chronic ulcer of other part of unspecified foot with unspecified severity: Secondary | ICD-10-CM | POA: Diagnosis not present

## 2021-09-20 LAB — GLUCOSE, CAPILLARY
Glucose-Capillary: 195 mg/dL — ABNORMAL HIGH (ref 70–99)
Glucose-Capillary: 214 mg/dL — ABNORMAL HIGH (ref 70–99)

## 2021-09-20 NOTE — Progress Notes (Addendum)
EON, ZUNKER A. ([id number]) ?Visit Report for 09/20/2021 ?Arrival Information Details ?Patient Name: Date of Service: ?[name], [name] A. 09/20/2021 10:00 A M ?Medical Record Number: [id number] ?Patient Account Number: [id number] ?Date of Birth/Sex: Treating RN: ?[birth year] (66 y.o. [name] ?Primary Care Nizar Cutler: [name] ?Other Clinician: [name] ?Referring Nakeya Adinolfi: ?Treating Daisuke Bailey/Extender: [name] ?[name] ?Weeks in Treatment: 3 ?Visit Information History Since Last Visit ?All ordered tests and consults were completed: Yes ?Patient Arrived: Knee Scooter ?Added or deleted any medications: No ?Arrival Time: 09:33 ?Any new allergies or adverse reactions: No ?Accompanied By: spouse ?Had a fall or experienced change in No ?Transfer Assistance: None ?activities of daily living that may affect ?Patient Identification Verified: Yes ?risk of falls: ?Secondary Verification Process Completed: Yes ?Signs or symptoms of abuse/neglect since last visito No ?Patient Requires Transmission-Based Precautions: No ?Hospitalized since last visit: No ?Patient Has Alerts: Yes ?Implantable device outside of the clinic excluding No ?Patient Alerts: Patient on Blood Thinner cellular tissue based products placed in the center ?PICC Line Right Arm since last visit: ?R ABI=1.1 TBI=.55 ?Pain Present Now: No ?L ABI=.86 TBI=.36 ?Electronic Signature(s) ?Signed: 09/21/2021 4:13:43 PM By: [name] CHT EMT BS ?, , ?Previous Signature: 09/20/2021 11:48:42 AM Version By: [name] CHT EMT BS ?, , ?Entered By: [name] on 09/21/2021 13:27:45 ?-------------------------------------------------------------------------------- ?Encounter Discharge Information Details ?Patient Name: Date of Service: ?[name], [name] A. 09/20/2021 10:00 A M ?Medical Record Number: [id number] ?Patient Account Number: [id number] ?Date of Birth/Sex: Treating RN: ?22-Apr-1956 (66 y.o. [name] ?Primary Care  Doak Mah: [name] ?Other Clinician: [name] ?Referring Kevyn Boquet: ?Treating Peityn Payton/Extender: [name] ?[name] ?Weeks in Treatment: 3 ?Encounter Discharge Information Items ?Discharge Condition: Stable ?Ambulatory Status: Knee Scooter ?Discharge Destination: Home ?Transportation: Private Auto ?Accompanied By: spouse ?Schedule Follow-up Appointment: No ?Clinical Summary of Care: ?Electronic Signature(s) ?Signed: 09/21/2021 4:13:43 PM By: [name] CHT EMT BS ?, , ?Entered By: [name] on 09/21/2021 13:28:40 ?-------------------------------------------------------------------------------- ?Patient/Caregiver Education Details ?Patient Name: ?Date of Service: ?[name], [name] A. 4/10/2023andnbsp10:00 A M ?Medical Record Number: [id number] ?Patient Account Number: [id number] ?Date of Birth/Gender: ?Treating RN: ?08/03/1955 (66 y.o. [name] ?Primary Care Physician: [name] ?Other Clinician: [name] ?Referring Physician: ?Treating Physician/Extender: [name] ?[name] ?Weeks in Treatment: 3 ?Education Assessment ?Education Provided To: ?Patient ?Education Topics Provided ?Hyperbaric Oxygenation: ?Methods: Demonstration, Explain/Verbal ?Responses: Return demonstration correctly, State content correctly ?Electronic Signature(s) ?Signed: 09/21/2021 4:13:43 PM By: [name] CHT EMT BS ?, , ?Entered By: [name] on 09/21/2021 11:12:31 ?-------------------------------------------------------------------------------- ?Vitals Details ?Patient Name: ?Date of Service: ?[name], [name] A. 09/20/2021 10:00 A M ?Medical Record Number: [id number] ?Patient Account Number: [id number] ?Date of Birth/Sex: ?Treating RN: ?[birth year] (66 y.o. [name] ?Primary Care Jordany Russett: [name] ?Other Clinician: [name] ?Referring Paydon Carll: ?Treating Nakul Avino/Extender: [name] ?[name] ?Weeks in Treatment: 3 ?Vital Signs ?Time Taken:  09:57 ?Temperature (??F): 97.6 ?Height (in): 74 ?Pulse (bpm): 91 ?Weight (lbs): 186 ?Respiratory Rate (breaths/min): 20 ?Body Mass Index (BMI): 23.9 ?Blood Pressure (mmHg): 113/84 ?Capillary Blood Glucose (mg/dl): 185 ?Reference Range: 80 - 120 mg / dl ?Electronic Signature(s) ?Signed: 09/20/2021 11:49:47 AM By: [name] CHT EMT BS ?, , ?Entered By: [name] on 09/20/2021 11:49:47 ?

## 2021-09-20 NOTE — Progress Notes (Addendum)
GERRIT, RAFALSKI A. ([id number]) ?Visit Report for 09/20/2021 ?Problem List Details ?Patient Name: Date of Service: ?[name], [name] 09/20/2021 10:00 A M ?Medical Record Number: [id number] ?Patient Account Number: [id number] ?Date of Birth/Sex: Treating RN: ?December 28, 1955 (66 y.o. 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 09/20/2021 ?Medical Record Number: [id number] ?Patient Account Number: [id number] ?Date of Birth/Sex: Treating RN: ?1956-04-29 (66 y.o. 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Modifier: ?Quantity: 5 ?Physician Procedures ?: CPT4 Code Description Modifier [id number] [phone] - WC PHYS HYPERBARIC OXYGEN THERAPY ICD-10 Diagnosis Description E11.621 Type 2 diabetes mellitus with foot ulcer 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

## 2021-09-20 NOTE — Progress Notes (Addendum)
RICKIE, GANGE A. ([id number]) ?Visit Report for 09/20/2021 ?HBO Details ?Patient Name: Date of Service: ?[name], [name] 09/20/2021 10:00 A M ?Medical Record Number: [id number] ?Patient Account Number: [id number] ?Date of Birth/Sex: Treating RN: ?February 05, 1956 (67 y.o. [name] ?Primary Care Pratyush Ammon: [name] ?Other Clinician: [name] ?Referring Layann Bluett: ?Treating Chasitie Passey/Extender: [name] ?[name] ?Weeks in Treatment: 3 ?HBO Treatment Course Details ?Treatment Course Number: 1 ?Ordering Verda Mehta: [name] ?T Treatments Ordered: ?otal 40 HBO Treatment Start Date: 09/20/2021 ?HBO Indication: ?Diabetic Ulcer(s) of the Lower Extremity ?Standard/Conservative Wound Care tried and failed greater than or equal to ?30 days ?Wound #1 Left, Medial Foot ?HBO Treatment Details ?Treatment Number: 1 ?Patient Type: Outpatient ?Chamber Type: Monoplace ?Chamber Serial #: [id number] ?Treatment Protocol: 2.5 ATA with 90 minutes oxygen, with two 5 minute air breaks ?Treatment Details ?Compression Rate Down: 1.0 psi / minute ?De-Compression Rate Up: 1.0 psi / minute ?A breaks and breathing ?ir ?Compress Tx Pressure periods Decompress Decompress ?Begins Reached (leave unused spaces Begins Ends ?blank) ?Chamber Pressure (ATA 1 2.5 2.5 2.5 2.5 2.5 - - 2.5 1 ?) ?Clock Time (24 hr) 10:28 10:50 11:20 11:25 11:55 12:00 - - 12:30 12:52 ?Treatment Length: 144 (minutes) ?Treatment Segments: 5 ?Vital Signs ?Capillary Blood Glucose Reference Range: 80 - 120 mg / dl ?HBO Diabetic Blood Glucose Intervention Range: <131 mg/dl or >409 mg/dl ?Time Vitals Blood Respiratory Capillary Blood Glucose Pulse Action ?Type: ?Pulse: Temperature: ?Taken: ?Pressure: ?Rate: ?Glucose (mg/dl): ?Meter #: Oximetry (%) Taken: ?Pre 09:57 113/84 91 20 97.6 195 ?Post 12:54 156/89 92 18 97.9 214 ?Treatment Response ?Treatment Toleration: Well ?Treatment Completion Status: Treatment Completed without Adverse Event ?Treatment Notes ?Today  was the patient's first treatment. Patient was safely placed in chamber after performing safety checklist. Chamber was compressed at a rate of 1 ?psi/min (confirmed by timer), chamber was decompressed at a rate of 1 psi/min as well. Patient tolerated travel well. There were no problems or concerns ?during treatment. ?Additional Procedure Documentation ?Tissue Sevierity: Necrosis of bone ?Physician HBO Attestation: ?I certify that I supervised this HBO treatment in accordance with Medicare ?guidelines. A trained emergency response team is readily available per Yes ?hospital policies and procedures. ?Continue HBOT as ordered. Yes ?Electronic Signature(s) ?Signed: 09/21/2021 12:21:41 PM By: [name] MD FACS ?Signed: 09/21/2021 4:13:43 PM By: [name] CHT EMT BS ?, , ?Signed: 09/21/2021 4:13:43 PM By: [name] CHT EMT BS ?, , ?Previous Signature: 09/20/2021 4:56:40 PM Version By: [name] MD FACS ?Entered By: [name] on 09/21/2021 10:54:11 ?-------------------------------------------------------------------------------- ?HBO Safety Checklist Details ?Patient Name: ?Date of Service: ?[name], [name] 09/20/2021 10:00 A M ?Medical Record Number: [id number] ?Patient Account Number: [id number] ?Date of Birth/Sex: ?Treating RN: ?January 02, 1956 (65 y.o. [name] ?Primary Care Saleem Coccia: [name] ?Other Clinician: [name] ?Referring Keria Widrig: ?Treating Tron Flythe/Extender: [name] ?[name] ?Weeks in Treatment: 3 ?HBO Safety Checklist Items ?Safety Checklist ?Consent Form Signed ?Patient voided / foley secured and emptied ?When did you last eato 0845 ?Last dose of injectable or oral agent 10PM Yesterday ?Ostomy pouch emptied and vented if applicable ?NA ?All implantable devices assessed, documented and approved ?NA ?Intravenous access site secured and place R Arm PICC ?Valuables secured ?NA ?Linens and cotton and cotton/polyester blend (less than 51%  polyester) ?Personal oil-based products / skin lotions / body lotions removed ?Wigs or hairpieces removed ?NA ?Smoking or tobacco materials removed ?NA ?Books / newspapers / magazines / loose paper removed ?Cologne, aftershave, perfume and  deodorant removed ?Jewelry removed (may wrap wedding band) ?Make-up removed ?NA ?Hair care products removed ?Battery operated devices (external) removed ?Heating patches and chemical warmers removed ?Titanium eyewear removed ?NA ?Nail polish cured greater than 10 hours ?NA ?Casting material cured greater than 10 hours ?NA ?Hearing aids removed ?NA ?Loose dentures or partials removed ?NA ?Prosthetics have been removed ?NA ?Patient demonstrates correct use of air break device (if applicable) ?Patient concerns have been addressed ?Patient grounding bracelet on and cord attached to chamber ?Specifics for Inpatients (complete in addition to above) ?Medication sheet sent with patient ?NA ?Intravenous medications needed or due during therapy sent with patient ?NA ?Drainage tubes (e.g. nasogastric tube or chest tube secured and vented) ?NA ?Endotracheal or Tracheotomy tube secured ?NA ?Cuff deflated of air and inflated with saline ?NA ?Airway suctioned ?NA ?Notes ?Paper version used prior to treatment. Wound vac attachment port vented and covered with gauze. ?Electronic Signature(s) ?Signed: 09/20/2021 6:44:01 PM By: [name] CHT EMT BS ?, , ?Entered By: [name] on 09/20/2021 15:13:04 ?

## 2021-09-21 ENCOUNTER — Encounter (HOSPITAL_BASED_OUTPATIENT_CLINIC_OR_DEPARTMENT_OTHER): Payer: BC Managed Care – PPO | Admitting: General Surgery

## 2021-09-21 DIAGNOSIS — Z794 Long term (current) use of insulin: Secondary | ICD-10-CM | POA: Diagnosis not present

## 2021-09-21 DIAGNOSIS — T8131XA Disruption of external operation (surgical) wound, not elsewhere classified, initial encounter: Secondary | ICD-10-CM | POA: Diagnosis not present

## 2021-09-21 DIAGNOSIS — L97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: Secondary | ICD-10-CM | POA: Diagnosis not present

## 2021-09-21 DIAGNOSIS — E11621 Type 2 diabetes mellitus with foot ulcer: Secondary | ICD-10-CM | POA: Diagnosis not present

## 2021-09-21 DIAGNOSIS — M86672 Other chronic osteomyelitis, left ankle and foot: Secondary | ICD-10-CM | POA: Diagnosis not present

## 2021-09-21 DIAGNOSIS — I2510 Atherosclerotic heart disease of native coronary artery without angina pectoris: Secondary | ICD-10-CM | POA: Diagnosis not present

## 2021-09-21 DIAGNOSIS — L97523 Non-pressure chronic ulcer of other part of left foot with necrosis of muscle: Secondary | ICD-10-CM | POA: Diagnosis not present

## 2021-09-21 DIAGNOSIS — E1140 Type 2 diabetes mellitus with diabetic neuropathy, unspecified: Secondary | ICD-10-CM | POA: Diagnosis not present

## 2021-09-21 DIAGNOSIS — Z951 Presence of aortocoronary bypass graft: Secondary | ICD-10-CM | POA: Diagnosis not present

## 2021-09-21 DIAGNOSIS — E11610 Type 2 diabetes mellitus with diabetic neuropathic arthropathy: Secondary | ICD-10-CM | POA: Diagnosis not present

## 2021-09-21 DIAGNOSIS — I739 Peripheral vascular disease, unspecified: Secondary | ICD-10-CM | POA: Diagnosis not present

## 2021-09-21 LAB — GLUCOSE, CAPILLARY
Glucose-Capillary: 197 mg/dL — ABNORMAL HIGH (ref 70–99)
Glucose-Capillary: 192 mg/dL — ABNORMAL HIGH (ref 70–99)

## 2021-09-21 NOTE — Progress Notes (Signed)
Colin [name], Colin [name] [name]. ([id number]) ?Visit Report for 09/21/2021 ?Arrival Information Details ?Patient Name: Date of Service: ?Colin [name], Colin [name]. 09/21/2021 10:00 [name] ?Medical Record Number: [id number] ?Patient Account Number: [id number] ?Date of Birth/Sex: Treating RN: ?1955/11/09 (66 y.o. M) Colin [name], [name] ?Primary Care Colin [name]: Colin [name] ?Other Clinician: Haywood [name] ?Referring Colin [name]: ?Treating Colin [name]/Extender: Colin [name] ?Colin [name] ?Weeks in Treatment: 3 ?Visit Information History Since Last Visit ?All ordered tests and consults were completed: Yes ?Patient Arrived: Knee Scooter ?Added or deleted any medications: No ?Arrival Time: 09:30 ?Any new allergies or adverse reactions: No ?Accompanied By: self ?Had [name] fall or experienced change in No ?Transfer Assistance: None ?activities of daily living that may affect ?Patient Identification Verified: Yes ?risk of falls: ?Secondary Verification Process Completed: Yes ?Signs or symptoms of abuse/neglect since last visito No ?Patient Requires Transmission-Based Precautions: No ?Hospitalized since last visit: No ?Patient Has Alerts: Yes ?Implantable device outside of the clinic excluding No ?Patient Alerts: Patient on Blood Thinner cellular tissue based products placed in the center ?PICC Line Right Arm since last visit: ?R ABI=1.1 TBI=.55 ?Pain Present Now: No ?L ABI=.86 TBI=.36 ?Electronic Signature(s) ?Signed: 09/21/2021 4:13:43 PM By: Colin [name] CHT EMT BS ?, , ?Entered By: Colin [name] on 09/21/2021 13:27:30 ?-------------------------------------------------------------------------------- ?Encounter Discharge Information Details ?Patient Name: Date of Service: ?Colin [name], Colin [name]. 09/21/2021 10:00 [name] ?Medical Record Number: [id number] ?Patient Account Number: [id number] ?Date of Birth/Sex: Treating RN: ?[birth year] (66 y.o. M) Colin [name], [name] ?Primary Care Aydenn Gervin: Colin [name] ?Other Clinician: Haywood [name] ?Referring Jamal Haskin: ?Treating  Gwendalynn Eckstrom/Extender: Colin [name] ?Colin [name] ?Weeks in Treatment: 3 ?Encounter Discharge Information Items ?Discharge Condition: Stable ?Ambulatory Status: Knee Scooter ?Discharge Destination: Home ?Transportation: Private Auto ?Accompanied By: spouse ?Schedule Follow-up Appointment: No ?Clinical Summary of Care: ?Electronic Signature(s) ?Signed: 09/21/2021 4:13:43 PM By: Colin [name] CHT EMT BS ?, , ?Entered By: Colin [name] on 09/21/2021 13:27:18 ?-------------------------------------------------------------------------------- ?Vitals Details ?Patient Name: ?Date of Service: ?Colin [name], Colin [name]. 09/21/2021 10:00 [name] ?Medical Record Number: [id number] ?Patient Account Number: [id number] ?Date of Birth/Sex: ?Treating RN: ?[birth year] (66 y.o. M) Colin [name], [name] ?Primary Care Memori Sammon: Colin [name] ?Other Clinician: Haywood [name] ?Referring Tika Hannis: ?Treating Misha Antonini/Extender: Colin [name] ?Colin [name] ?Weeks in Treatment: 3 ?Vital Signs ?Time Taken: 09:49 ?Temperature (??F): 97.5 ?Height (in): 74 ?Pulse (bpm): 98 ?Weight (lbs): 186 ?Respiratory Rate (breaths/min): 12 ?Body Mass Index (BMI): 23.9 ?Blood Pressure (mmHg): 124/81 ?Capillary Blood Glucose (mg/dl): 482 ?Reference Range: 80 - 120 mg / dl ?Electronic Signature(s) ?Signed: 09/21/2021 4:13:43 PM By: Colin [name] CHT EMT BS ?, , ?Entered By: Colin [name] on 09/21/2021 11:55:07 ?

## 2021-09-21 NOTE — Progress Notes (Addendum)
JAZEN, SPRAGGINS A. ([id number]) ?Visit Report for 09/21/2021 ?Problem List Details ?Patient Name: Date of Service: ?[name], [name] A. 09/21/2021 10:00 A M ?Medical Record Number: [id number] ?Patient Account Number: [id number] ?Date of Birth/Sex: Treating RN: ?1956-01-22 (66 y.o. 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 08/25/2021 No Yes ?E11.621 Type 2 diabetes mellitus with foot ulcer 08/25/2021 No Yes ?I25.10 Atherosclerotic heart disease of native coronary artery without angina pectoris 08/25/2021 No Yes ?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 09/21/2021 ?Medical Record Number: [id number] ?Patient Account Number: [id number] ?Date of Birth/Sex: Treating RN: ?12/27/1955 (66 y.o. 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Modifier: ?Quantity: 5 ?Physician Procedures ?: CPT4 Code Description Modifier [id number] - WC PHYS HYPERBARIC OXYGEN THERAPY ICD-10 Diagnosis Description E11.621 Type 2 diabetes mellitus with foot ulcer 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

## 2021-09-21 NOTE — Progress Notes (Signed)
[name] ([id number]) ?Visit Report for 09/21/2021 ?HBO Details ?Patient Name: Date of Service: ?[name] [name], [name] 09/21/2021 10:00 [name] [name] ?Medical Record Number: [id number] ?Patient Account Number: [id number] ?Date of Birth/Sex: Treating RN: ?May 18, 1956 (66 y.o. [name]) Elesa Hacker, [name] ?Primary Care Chidera Dearcos: [name] ?Other Clinician: [name] ?Referring Abdullah Rizzi: ?Treating Antonino Nienhuis/Extender: [name] ?[name] ?Weeks in Treatment: 3 ?HBO Treatment Course Details ?Treatment Course Number: 1 ?Ordering Azarian Starace: [name] ?T Treatments Ordered: ?otal 40 HBO Treatment Start Date: 09/20/2021 ?HBO Indication: ?Diabetic Ulcer(s) of the Lower Extremity ?Standard/Conservative Wound Care tried and failed greater than or equal to ?30 days ?Wound #1 Left, Medial Foot ?HBO Treatment Details ?Treatment Number: 2 ?Patient Type: Outpatient ?Chamber Type: Monoplace ?Chamber Serial #: [id number] ?Treatment Protocol: 2.5 ATA with 90 minutes oxygen, with two 5 minute air breaks ?Treatment Details ?Compression Rate Down: 1.0 psi / minute ?De-Compression Rate Up: 1.0 psi / minute ?[name] breaks and breathing ?ir ?Compress Tx Pressure periods Decompress Decompress ?Begins Reached (leave unused spaces Begins Ends ?blank) ?Chamber Pressure (ATA 1 2.5 2.5 2.5 2.5 2.5 - - 2.5 1 ?) ?Clock Time (24 hr) 09:56 10:16 10:46 10:51 11:21 11:26 - - 11:56 12:18 ?Treatment Length: 142 (minutes) ?Treatment Segments: 5 ?Vital Signs ?Capillary Blood Glucose Reference Range: 80 - 120 mg / dl ?HBO Diabetic Blood Glucose Intervention Range: <131 mg/dl or >003 mg/dl ?Time Vitals Blood Respiratory Capillary Blood Glucose Pulse Action ?Type: ?Pulse: Temperature: ?Taken: ?Pressure: ?Rate: ?Glucose (mg/dl): ?Meter #: Oximetry (%) Taken: ?Pre 09:49 124/81 98 12 97.5 197 2 ?Post 12:20 158/92 79 12 97.6 192 2 ?Treatment Response ?Treatment Toleration: Well ?Treatment Completion Status: Treatment Completed without Adverse Event ?Treatment  Notes ?Patient safely placed in chamber after performing safety check list. Chamber was compressed at [name] rate of 1 psi/min until reaching 16 psi at which point the ?rate set was changed to 1.5 psi/min. Patient tolerated travel well. During decompression phase of treatment chamber was set at 1.5 psi/min until reaching 15 ?psi at which time rate set was reduced back to 1 psi/min. Patient tolerated treatment well today. ?Additional Procedure Documentation ?Tissue Sevierity: Necrosis of bone ?Physician HBO Attestation: ?I certify that I supervised this HBO treatment in accordance with Medicare ?guidelines. [name] trained emergency response team is readily available per Yes ?hospital policies and procedures. ?Continue HBOT as ordered. Yes ?Electronic Signature(s) ?Signed: 09/21/2021 1:33:40 PM By: [name] MD FACS ?Entered By: [name] on 09/21/2021 13:33:40 ?-------------------------------------------------------------------------------- ?HBO Safety Checklist Details ?Patient Name: ?Date of Service: ?[name] [name], [name] 09/21/2021 10:00 [name] [name] ?Medical Record Number: [id number] ?Patient Account Number: [id number] ?Date of Birth/Sex: ?Treating RN: ?[birth year] (66 y.o. [name]) Elesa Hacker, [name] ?Primary Care Jeanette Moffatt: [name] ?Other Clinician: [name] ?Referring Daison Braxton: ?Treating Obaloluwa Delatte/Extender: [name] ?[name] ?Weeks in Treatment: 3 ?HBO Safety Checklist Items ?Safety Checklist ?Consent Form Signed ?Patient voided / foley secured and emptied ?When did you last eato 0700 ?Last dose of injectable or oral agent Last night 10 PM ?Ostomy pouch emptied and vented if applicable ?[name] ?All implantable devices assessed, documented and approved ?[name] ?Intravenous access site secured and place R Arm PICC ?Valuables secured ?Linens and cotton and cotton/polyester blend (less than 51% polyester) ?Personal oil-based products / skin lotions / body lotions removed ?Wigs or hairpieces removed ?[name] ?Smoking or tobacco  materials removed ?[name] ?Books / newspapers / magazines / loose paper removed ?Cologne, aftershave, perfume and deodorant removed ?Jewelry removed (may wrap wedding band) ?Make-up removed ?[name] ?Hair care products  removed ?Battery operated devices (external) removed ?Heating patches and chemical warmers removed ?Titanium eyewear removed ?[name] ?Nail polish cured greater than 10 hours ?[name] ?Casting material cured greater than 10 hours ?[name] ?Hearing aids removed ?[name] ?Loose dentures or partials removed ?[name] ?Prosthetics have been removed ?[name] ?Patient demonstrates correct use of air break device (if applicable) ?Patient concerns have been addressed ?Patient grounding bracelet on and cord attached to chamber ?Specifics for Inpatients (complete in addition to above) ?Medication sheet sent with patient ?[name] ?Intravenous medications needed or due during therapy sent with patient ?[name] ?Drainage tubes (e.g. nasogastric tube or chest tube secured and vented) ?[name] ?Endotracheal or Tracheotomy tube secured ?[name] ?Cuff deflated of air and inflated with saline ?[name] ?Airway suctioned ?[name] ?Notes ?Paper version used prior to treatment. Wound vac attachment port vented and covered with gauze. ?Electronic Signature(s) ?Signed: 09/21/2021 4:13:43 PM By: [name] CHT EMT BS ?, , ?Entered By: [name] on 09/21/2021 11:56:49 ?

## 2021-09-22 ENCOUNTER — Encounter: Payer: Self-pay | Admitting: Internal Medicine

## 2021-09-22 ENCOUNTER — Encounter (HOSPITAL_BASED_OUTPATIENT_CLINIC_OR_DEPARTMENT_OTHER): Payer: BC Managed Care – PPO | Admitting: General Surgery

## 2021-09-22 ENCOUNTER — Telehealth: Payer: Self-pay

## 2021-09-22 DIAGNOSIS — E785 Hyperlipidemia, unspecified: Secondary | ICD-10-CM | POA: Diagnosis not present

## 2021-09-22 DIAGNOSIS — Z452 Encounter for adjustment and management of vascular access device: Secondary | ICD-10-CM | POA: Diagnosis not present

## 2021-09-22 DIAGNOSIS — E11610 Type 2 diabetes mellitus with diabetic neuropathic arthropathy: Secondary | ICD-10-CM | POA: Diagnosis not present

## 2021-09-22 DIAGNOSIS — I10 Essential (primary) hypertension: Secondary | ICD-10-CM | POA: Diagnosis not present

## 2021-09-22 DIAGNOSIS — I252 Old myocardial infarction: Secondary | ICD-10-CM | POA: Diagnosis not present

## 2021-09-22 DIAGNOSIS — Z794 Long term (current) use of insulin: Secondary | ICD-10-CM | POA: Diagnosis not present

## 2021-09-22 DIAGNOSIS — L97522 Non-pressure chronic ulcer of other part of left foot with fat layer exposed: Secondary | ICD-10-CM | POA: Diagnosis not present

## 2021-09-22 DIAGNOSIS — Z7984 Long term (current) use of oral hypoglycemic drugs: Secondary | ICD-10-CM | POA: Diagnosis not present

## 2021-09-22 DIAGNOSIS — L97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: Secondary | ICD-10-CM | POA: Diagnosis not present

## 2021-09-22 DIAGNOSIS — I2510 Atherosclerotic heart disease of native coronary artery without angina pectoris: Secondary | ICD-10-CM | POA: Diagnosis not present

## 2021-09-22 DIAGNOSIS — E11621 Type 2 diabetes mellitus with foot ulcer: Secondary | ICD-10-CM | POA: Diagnosis not present

## 2021-09-22 DIAGNOSIS — T8131XA Disruption of external operation (surgical) wound, not elsewhere classified, initial encounter: Secondary | ICD-10-CM | POA: Diagnosis not present

## 2021-09-22 DIAGNOSIS — Z792 Long term (current) use of antibiotics: Secondary | ICD-10-CM | POA: Diagnosis not present

## 2021-09-22 DIAGNOSIS — Z951 Presence of aortocoronary bypass graft: Secondary | ICD-10-CM | POA: Diagnosis not present

## 2021-09-22 DIAGNOSIS — M86672 Other chronic osteomyelitis, left ankle and foot: Secondary | ICD-10-CM | POA: Diagnosis not present

## 2021-09-22 DIAGNOSIS — K219 Gastro-esophageal reflux disease without esophagitis: Secondary | ICD-10-CM | POA: Diagnosis not present

## 2021-09-22 DIAGNOSIS — D649 Anemia, unspecified: Secondary | ICD-10-CM | POA: Diagnosis not present

## 2021-09-22 DIAGNOSIS — Z7982 Long term (current) use of aspirin: Secondary | ICD-10-CM | POA: Diagnosis not present

## 2021-09-22 DIAGNOSIS — I739 Peripheral vascular disease, unspecified: Secondary | ICD-10-CM | POA: Diagnosis not present

## 2021-09-22 DIAGNOSIS — E1140 Type 2 diabetes mellitus with diabetic neuropathy, unspecified: Secondary | ICD-10-CM | POA: Diagnosis not present

## 2021-09-22 DIAGNOSIS — M1990 Unspecified osteoarthritis, unspecified site: Secondary | ICD-10-CM | POA: Diagnosis not present

## 2021-09-22 LAB — GLUCOSE, CAPILLARY
Glucose-Capillary: 190 mg/dL — ABNORMAL HIGH (ref 70–99)
Glucose-Capillary: 299 mg/dL — ABNORMAL HIGH (ref 70–99)

## 2021-09-22 NOTE — Progress Notes (Signed)
[name], Colin A. ([id number]) ?Visit Report for 09/22/2021 ?HBO Details ?Patient Name: Date of Service: ?[name], [name] 09/22/2021 10:00 A M ?Medical Record Number: [id number] ?Patient Account Number: [id number] ?Date of Birth/Sex: Treating RN: ?04/09/1956 (66 y.o. [name] ?Primary Care Grafton Warzecha: [name] ?Other Clinician: [name] ?Referring Blondina Coderre: ?Treating Kafi Dotter/Extender: [name] ?[name] ?Weeks in Treatment: 4 ?HBO Treatment Course Details ?Treatment Course Number: 1 ?Ordering Brystol Wasilewski: [name] ?T Treatments Ordered: ?otal 40 HBO Treatment Start Date: 09/20/2021 ?HBO Indication: ?Diabetic Ulcer(s) of the Lower Extremity ?Standard/Conservative Wound Care tried and failed greater than or equal to ?30 days ?Wound #1 Left, Medial Foot ?HBO Treatment Details ?Treatment Number: 3 ?Patient Type: Outpatient ?Chamber Type: Monoplace ?Chamber Serial #: [id number] ?Treatment Protocol: 2.5 ATA with 90 minutes oxygen, with two 5 minute air breaks ?Treatment Details ?Compression Rate Down: 1.5 psi / minute ?De-Compression Rate Up: 2.0 psi / minute ?A breaks and breathing ?ir ?Compress Tx Pressure periods Decompress Decompress ?Begins Reached (leave unused spaces Begins Ends ?blank) ?Chamber Pressure (ATA 1 2.5 2.5 2.5 2.5 2.5 - - 2.5 1 ?) ?Clock Time (24 hr) 09:43 09:58 10:28 10:33 11:03 11:08 - - 11:38 11:51 ?Treatment Length: 128 (minutes) ?Treatment Segments: 4 ?Vital Signs ?Capillary Blood Glucose Reference Range: 80 - 120 mg / dl ?HBO Diabetic Blood Glucose Intervention Range: <131 mg/dl or >357 mg/dl ?Type: Time Vitals Blood Pulse: Respiratory Temperature: Capillary Blood Glucose Pulse Action ?Taken: Pressure: Rate: Glucose (mg/dl): Meter #: Oximetry (%) Taken: ?Pre 09:35 129/80 94 16 97.4 190 2 ?Post 11:54 146/82 81 16 97.9 299 2 Informed physician, patient states he feels fine. ?Treatment Response ?Treatment Toleration: Well ?Treatment Completion Status: Treatment Completed  without Adverse Event ?Treatment Notes ?Patient was safely placed in chamber after completing safety checklist. Chamber was compressed at a rate of 1 psi/min until reaching 5 psig at which point ?rate set was increased to 1.5 psi/min. Rate set was increased again at 20 psig until reaching 2.5 ATA. Patient tolerated travel well at an average speed of 1.5 ?psi/min. During decompression the rate set was set at 2 psi/min. Patient tolerated travel well. Glucose level was 299 mg/dL. Patient is asymptomatic for ?hyperglycemia. Patient states he feels fine. ?Additional Procedure Documentation ?Tissue Sevierity: Necrosis of bone ?Physician HBO Attestation: ?I certify that I supervised this HBO treatment in accordance with Medicare ?guidelines. A trained emergency response team is readily available per Yes ?hospital policies and procedures. ?Continue HBOT as ordered. Yes ?Electronic Signature(s) ?Signed: 09/22/2021 2:46:25 PM By: [name] MD FACS ?Signed: 09/22/2021 2:46:25 PM By: [name] MD FACS ?Previous Signature: 09/22/2021 1:58:45 PM Version By: [name] CHT EMT BS ?, , ?Entered By: [name] on 09/22/2021 14:46:24 ?-------------------------------------------------------------------------------- ?HBO Safety Checklist Details ?Patient Name: ?Date of Service: ?[name], [name] 09/22/2021 10:00 A M ?Medical Record Number: [id number] ?Patient Account Number: [id number] ?Date of Birth/Sex: ?Treating RN: ?11/25/1955 (66 y.o. [name] ?Primary Care Atalie Oros: [name] ?Other Clinician: [name] ?Referring Theodosia Bahena: ?Treating Jaymie Mckiddy/Extender: [name] ?[name] ?Weeks in Treatment: 4 ?HBO Safety Checklist Items ?Safety Checklist ?Consent Form Signed ?Patient voided / foley secured and emptied ?When did you last eato 0910 ?Last dose of injectable or oral agent 1030 pm ?Ostomy pouch emptied and vented if applicable ?NA ?All implantable devices assessed, documented and  approved ?NA ?Intravenous access site secured and place Right Forearm PICC ?Valuables secured ?Linens and cotton and cotton/polyester blend (less than 51% polyester) ?Personal oil-based products /  skin lotions / body lotions removed ?Wigs or hairpieces removed ?NA ?Smoking or tobacco materials removed ?NA ?Books / newspapers / magazines / loose paper removed ?Cologne, aftershave, perfume and deodorant removed ?Jewelry removed (may wrap wedding band) ?Make-up removed ?NA ?Hair care products removed ?Battery operated devices (external) removed ?Heating patches and chemical warmers removed ?Titanium eyewear removed ?Nail polish cured greater than 10 hours ?NA ?Casting material cured greater than 10 hours ?NA ?Hearing aids removed ?NA ?Loose dentures or partials removed ?NA ?Prosthetics have been removed ?NA ?Patient demonstrates correct use of air break device (if applicable) ?Patient concerns have been addressed ?Patient grounding bracelet on and cord attached to chamber ?Specifics for Inpatients (complete in addition to above) ?Medication sheet sent with patient ?NA ?Intravenous medications needed or due during therapy sent with patient ?NA ?Drainage tubes (e.g. nasogastric tube or chest tube secured and vented) ?NA ?Endotracheal or Tracheotomy tube secured ?NA ?Cuff deflated of air and inflated with saline ?NA ?Airway suctioned ?NA ?Notes ?Paper version used prior to treatment. Wound vac attachment port vented and covered with gauze. ?Electronic Signature(s) ?Signed: 09/22/2021 1:58:45 PM By: [name] CHT EMT BS ?, , ?Entered By: [name] on 09/22/2021 10:59:23 ?

## 2021-09-22 NOTE — Progress Notes (Signed)
RALEN, KAMPE A. ([id number]) ?Visit Report for 09/22/2021 ?Problem List Details ?Patient Name: Date of Service: ?[name], [name] 09/22/2021 10:00 A M ?Medical Record Number: [id number] ?Patient Account Number: [id number] ?Date of Birth/Sex: Treating RN: ?12-23-1955 (66 y.o. 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 08/25/2021 No Yes ?E11.621 Type 2 diabetes mellitus with foot ulcer 08/25/2021 No Yes ?I25.10 Atherosclerotic heart disease of native coronary artery without angina pectoris 08/25/2021 No Yes ?[id number] 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 09/22/2021 ?Medical Record Number: [id number] ?Patient Account Number: [id number] ?Date of Birth/Sex: Treating RN: ?[birth year] (66 y.o. 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 ?Facility Procedures ?CPT4 Code: [id number] ?Description: G0277-(Facility Use Only) HBOT full body chamber, 61min , ICD-10 Diagnosis Description E11.621 Type 2 diabetes mellitus with foot ulcer 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 M86.672 Other chronic  ?osteomyelitis, left ankle and foot 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 ?Modifier: ?Quantity: 4 ?Physician Procedures ?: CPT4 Code Description Modifier [id number] - WC PHYS HYPERBARIC OXYGEN THERAPY ICD-10 Diagnosis Description E11.621 Type 2 diabetes mellitus with foot ulcer 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

## 2021-09-22 NOTE — Telephone Encounter (Addendum)
Patient's wife called office today regarding iv antibiotics. States that last dose of Cefepime is 4/17 and Dapto is 4/19. Would like to know if home health needs to extend antibiotics. Okay with doing virtual appt on 4/17 if necessary.  ?Patient is scheduled to follow up on 4/19. Will forward message to Md. ?Juanita Laster, RMA ? ?

## 2021-09-22 NOTE — Progress Notes (Signed)
Colin Colin, Colin Colin. ([id number]) ?Visit Report for 09/22/2021 ?Arrival Information Details ?Patient Name: Date of Service: ?[name], MILTO N Colin. 09/22/2021 10:00 Colin M ?Medical Record Number: [id number] ?Patient Account Number: [id number] ?Date of Birth/Sex: Treating RN: ?1955-08-24 (66 y.o. Colin Colin ?Primary Care Latronda Spink: Colin Colin ?Other Clinician: Donavan Burnet ?Referring Autymn Omlor: ?Treating Dainel Arcidiacono/Extender: [name] ?Colin Colin ?Weeks in Treatment: 4 ?Visit Information History Since Last Visit ?All ordered tests and consults were completed: Yes ?Patient Arrived: Knee Scooter ?Added or deleted any medications: No ?Arrival Time: 09:20 ?Any new allergies or adverse reactions: No ?Accompanied By: spouse ?Had Colin fall or experienced change in No ?Transfer Assistance: None ?activities of daily living that may affect ?Patient Identification Verified: Yes ?risk of falls: ?Secondary Verification Process Completed: Yes ?Signs or symptoms of abuse/neglect since last visito No ?Patient Requires Transmission-Based Precautions: No ?Hospitalized since last visit: No ?Patient Has Alerts: Yes ?Implantable device outside of the clinic excluding No ?Patient Alerts: Patient on Blood Thinner cellular tissue based products placed in the center ?PICC Line Right Arm since last visit: ?R ABI=1.1 TBI=.55 ?Pain Present Now: No ?L ABI=.86 TBI=.36 ?Electronic Signature(s) ?Signed: 09/22/2021 1:58:45 PM By: Donavan Burnet CHT EMT BS ?, , ?Entered By: Donavan Burnet on 09/22/2021 12:15:15 ?-------------------------------------------------------------------------------- ?Encounter Discharge Information Details ?Patient Name: Date of Service: ?[name], MILTO N Colin. 09/22/2021 10:00 Colin M ?Medical Record Number: [id number] ?Patient Account Number: [id number] ?Date of Birth/Sex: Treating RN: ?08-18-1955 (66 y.o. Colin Colin ?Primary Care Abigal Choung: Colin Colin ?Other Clinician: Donavan Burnet ?Referring Colin Colin: ?Treating  Colin Colin/Extender: [name] ?Colin Colin ?Weeks in Treatment: 4 ?Encounter Discharge Information Items ?Discharge Condition: Stable ?Ambulatory Status: Knee Scooter ?Discharge Destination: Home ?Transportation: Private Auto ?Accompanied By: spouse ?Schedule Follow-up Appointment: No ?Clinical Summary of Care: ?Electronic Signature(s) ?Signed: 09/22/2021 1:58:45 PM By: Donavan Burnet CHT EMT BS ?, , ?Entered By: Donavan Burnet on 09/22/2021 12:14:28 ?-------------------------------------------------------------------------------- ?Patient/Caregiver Education Details ?Patient Name: ?Date of Service: ?[name], MILTO N Colin. 4/12/2023andnbsp10:00 Colin M ?Medical Record Number: [id number] ?Patient Account Number: [id number] ?Date of Birth/Gender: ?Treating RN: ?[birth year] (66 y.o. Colin Colin ?Primary Care Physician: Colin Colin ?Other Clinician: Donavan Burnet ?Referring Physician: ?Treating Physician/Extender: [name] ?Colin Colin ?Weeks in Treatment: 4 ?Education Assessment ?Education Provided To: ?Patient ?Education Topics Provided ?Tissue Oxygenation: ?Methods: Explain/Verbal ?Responses: Return demonstration correctly ?Electronic Signature(s) ?Signed: 09/22/2021 1:58:45 PM By: Donavan Burnet CHT EMT BS ?, , ?Entered By: Donavan Burnet on 09/22/2021 12:15:04 ?-------------------------------------------------------------------------------- ?Vitals Details ?Patient Name: ?Date of Service: ?[name], MILTO N Colin. 09/22/2021 10:00 Colin M ?Medical Record Number: [id number] ?Patient Account Number: [id number] ?Date of Birth/Sex: ?Treating RN: ?March 27, 1956 (66 y.o. Colin Colin ?Primary Care Colin Colin: Colin Colin ?Other Clinician: Donavan Burnet ?Referring Colin Colin: ?Treating Colin Colin/Extender: [name] ?Colin Colin ?Weeks in Treatment: 4 ?Vital Signs ?Time Taken: 09:35 ?Temperature (??F): 97.4 ?Height (in): 74 ?Pulse (bpm): 94 ?Weight (lbs): 186 ?Respiratory Rate (breaths/min): 16 ?Body Mass  Index (BMI): 23.9 ?Blood Pressure (mmHg): 129/80 ?Capillary Blood Glucose (mg/dl): 190 ?Reference Range: 80 - 120 mg / dl ?Electronic Signature(s) ?Signed: 09/22/2021 1:58:45 PM By: Donavan Burnet CHT EMT BS ?, , ?Entered By: Donavan Burnet on 09/22/2021 10:57:57 ?

## 2021-09-23 ENCOUNTER — Encounter (HOSPITAL_BASED_OUTPATIENT_CLINIC_OR_DEPARTMENT_OTHER): Payer: BC Managed Care – PPO | Admitting: General Surgery

## 2021-09-23 DIAGNOSIS — T8131XA Disruption of external operation (surgical) wound, not elsewhere classified, initial encounter: Secondary | ICD-10-CM | POA: Diagnosis not present

## 2021-09-23 DIAGNOSIS — I739 Peripheral vascular disease, unspecified: Secondary | ICD-10-CM | POA: Diagnosis not present

## 2021-09-23 DIAGNOSIS — M86672 Other chronic osteomyelitis, left ankle and foot: Secondary | ICD-10-CM | POA: Diagnosis not present

## 2021-09-23 DIAGNOSIS — E1140 Type 2 diabetes mellitus with diabetic neuropathy, unspecified: Secondary | ICD-10-CM | POA: Diagnosis not present

## 2021-09-23 DIAGNOSIS — Z951 Presence of aortocoronary bypass graft: Secondary | ICD-10-CM | POA: Diagnosis not present

## 2021-09-23 DIAGNOSIS — L97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: Secondary | ICD-10-CM | POA: Diagnosis not present

## 2021-09-23 DIAGNOSIS — I2510 Atherosclerotic heart disease of native coronary artery without angina pectoris: Secondary | ICD-10-CM | POA: Diagnosis not present

## 2021-09-23 DIAGNOSIS — E11610 Type 2 diabetes mellitus with diabetic neuropathic arthropathy: Secondary | ICD-10-CM | POA: Diagnosis not present

## 2021-09-23 DIAGNOSIS — E11621 Type 2 diabetes mellitus with foot ulcer: Secondary | ICD-10-CM | POA: Diagnosis not present

## 2021-09-23 LAB — GLUCOSE, CAPILLARY
Glucose-Capillary: 240 mg/dL — ABNORMAL HIGH (ref 70–99)
Glucose-Capillary: 199 mg/dL — ABNORMAL HIGH (ref 70–99)

## 2021-09-23 NOTE — Telephone Encounter (Signed)
Called advance with verbal order to extend antibiotics through 4/19. Spoke with Amy who was able to take call. Left voicemail requesting patient call office back with update. ?Juanita Laster, RMA ? ?

## 2021-09-23 NOTE — Progress Notes (Signed)
MIKE, HAMRE A. ([id number]) ?Visit Report for 09/23/2021 ?Problem List Details ?Patient Name: Date of Service: ?[name], [name] A. 09/23/2021 10:00 A M ?Medical Record Number: [id number] ?Patient Account Number: [id number] ?Date of Birth/Sex: Treating RN: ?1955/12/08 (66 y.o. 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 08/25/2021 No Yes ?E11.621 Type 2 diabetes mellitus with foot ulcer 08/25/2021 No Yes ?I25.10 Atherosclerotic heart disease of native coronary artery without angina pectoris 08/25/2021 No Yes ?I69.629 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 09/23/2021 ?Medical Record Number: [id number] ?Patient Account Number: [id number] ?Date of Birth/Sex: Treating RN: ?04/19/1956 (66 y.o. 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

## 2021-09-24 ENCOUNTER — Encounter (HOSPITAL_BASED_OUTPATIENT_CLINIC_OR_DEPARTMENT_OTHER): Payer: BC Managed Care – PPO | Admitting: General Surgery

## 2021-09-24 DIAGNOSIS — L97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: Secondary | ICD-10-CM | POA: Diagnosis not present

## 2021-09-24 DIAGNOSIS — E1140 Type 2 diabetes mellitus with diabetic neuropathy, unspecified: Secondary | ICD-10-CM | POA: Diagnosis not present

## 2021-09-24 DIAGNOSIS — M86672 Other chronic osteomyelitis, left ankle and foot: Secondary | ICD-10-CM | POA: Diagnosis not present

## 2021-09-24 DIAGNOSIS — E11610 Type 2 diabetes mellitus with diabetic neuropathic arthropathy: Secondary | ICD-10-CM | POA: Diagnosis not present

## 2021-09-24 DIAGNOSIS — I2510 Atherosclerotic heart disease of native coronary artery without angina pectoris: Secondary | ICD-10-CM | POA: Diagnosis not present

## 2021-09-24 DIAGNOSIS — Z951 Presence of aortocoronary bypass graft: Secondary | ICD-10-CM | POA: Diagnosis not present

## 2021-09-24 DIAGNOSIS — E11621 Type 2 diabetes mellitus with foot ulcer: Secondary | ICD-10-CM | POA: Diagnosis not present

## 2021-09-24 DIAGNOSIS — T8131XA Disruption of external operation (surgical) wound, not elsewhere classified, initial encounter: Secondary | ICD-10-CM | POA: Diagnosis not present

## 2021-09-24 DIAGNOSIS — I739 Peripheral vascular disease, unspecified: Secondary | ICD-10-CM | POA: Diagnosis not present

## 2021-09-24 LAB — GLUCOSE, CAPILLARY
Glucose-Capillary: 190 mg/dL — ABNORMAL HIGH (ref 70–99)
Glucose-Capillary: 285 mg/dL — ABNORMAL HIGH (ref 70–99)

## 2021-09-24 NOTE — Progress Notes (Signed)
RYDGE, TEXIDOR A. ([id number]) ?Visit Report for 09/24/2021 ?Problem List Details ?Patient Name: Date of Service: ?[name], [name] 09/24/2021 10:00 A M ?Medical Record Number: [id number] ?Patient Account Number: [id number] ?Date of Birth/Sex: Treating RN: ?Nov 07, 1955 (66 y.o. 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 08/25/2021 No Yes ?E11.621 Type 2 diabetes mellitus with foot ulcer 08/25/2021 No Yes ?I25.10 Atherosclerotic heart disease of native coronary artery without angina pectoris 08/25/2021 No Yes ?E67.544 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 09/24/2021 ?Medical Record Number: [id number] ?Patient Account Number: [id number] ?Date of Birth/Sex: Treating RN: ?December 08, 1955 (66 y.o. 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 ?Facility Procedures ?CPT4 Code: 32549826 ?Description: G0277-(Facility Use Only) HBOT full body chamber, , ?Modifier: ?Quantity: 4 ?CPT4 Code: [id number] ?Description: 99183-Physician attendance and supervision of hyperbaric oxygen therapy, per session ICD-10 Diagnosis Description [phone] Other chronic osteomyelitis, left ankle and foot E11.621 Type 2 diabetes mellitus with foot ulcer L97.324 Non-pressure  ?chronic ulcer of left ankle with necrosis of bone ?Modifier: ?Quantity: 1 ?Physician Procedures ?: CPT4 Code Description Modifier [id number] 99183 - WC PHYS HYPERBARIC OXYGEN THERAPY ICD-10 Diagnosis Description M86.672 Other chronic osteomyelitis, left ankle and foot E11.621 Type 2 diabetes mellitus with foot ulcer L97.324 Non-pressure chronic ulcer  ?of left ankle with necrosis of bone ?Quantity: 1 ?Electronic Signature(s) ?Signed: 09/24/2021 1:41:00 PM By: [name] MD FACS ?Entered By: [name] on 09/24/2021 13:41:00 ?

## 2021-09-24 NOTE — Progress Notes (Signed)
ASHOT, CARIGNAN A. ([id number]) ?Visit Report for 09/23/2021 ?Arrival Information Details ?Patient Name: Date of Service: ?[name], [name] 09/23/2021 10:00 A M ?Medical Record Number: [id number] ?Patient Account Number: [id number] ?Date of Birth/Sex: Treating RN: ?03/22/1956 (66 y.o. M) [name], [name] ?Primary Care Nikcole Eischeid: [name] ?Other Clinician: Donavan Burnet ?Referring Hardin Hardenbrook: ?Treating Ardon Franklin/Extender: [name] ?[name] ?Weeks in Treatment: 4 ?Visit Information History Since Last Visit ?All ordered tests and consults were completed: Yes ?Patient Arrived: Knee Scooter ?Added or deleted any medications: No ?Arrival Time: 09:40 ?Any new allergies or adverse reactions: No ?Accompanied By: self ?Had a fall or experienced change in No ?Transfer Assistance: None ?activities of daily living that may affect ?Patient Identification Verified: Yes ?risk of falls: ?Secondary Verification Process Completed: Yes ?Signs or symptoms of abuse/neglect since last visito No ?Patient Requires Transmission-Based Precautions: No ?Hospitalized since last visit: No ?Patient Has Alerts: Yes ?Implantable device outside of the clinic excluding No ?Patient Alerts: Patient on Blood Thinner cellular tissue based products placed in the center ?PICC Line Right Arm since last visit: ?R ABI=1.1 TBI=.55 ?Pain Present Now: No ?L ABI=.86 TBI=.36 ?Electronic Signature(s) ?Signed: 09/24/2021 2:13:00 PM By: Donavan Burnet CHT EMT BS ?, , ?Entered By: Donavan Burnet on 09/23/2021 10:38:12 ?-------------------------------------------------------------------------------- ?Encounter Discharge Information Details ?Patient Name: Date of Service: ?[name], [name] 09/23/2021 10:00 A M ?Medical Record Number: [id number] ?Patient Account Number: [id number] ?Date of Birth/Sex: Treating RN: ?18-May-1956 (66 y.o. M) [name], [name] ?Primary Care Mykayla Brinton: [name] ?Other Clinician: Donavan Burnet ?Referring Jakya Dovidio: ?Treating  Etherine Mackowiak/Extender: [name] ?[name] ?Weeks in Treatment: 4 ?Encounter Discharge Information Items ?Discharge Condition: Stable ?Ambulatory Status: Knee Scooter ?Discharge Destination: Home ?Transportation: Private Auto ?Accompanied By: spouse ?Schedule Follow-up Appointment: No ?Clinical Summary of Care: ?Electronic Signature(s) ?Signed: 09/24/2021 2:13:00 PM By: Donavan Burnet CHT EMT BS ?, , ?Entered By: Donavan Burnet on 09/23/2021 14:28:08 ?-------------------------------------------------------------------------------- ?Vitals Details ?Patient Name: ?Date of Service: ?[name], [name] 09/23/2021 10:00 A M ?Medical Record Number: [id number] ?Patient Account Number: [id number] ?Date of Birth/Sex: ?Treating RN: ?11-21-1955 (66 y.o. M) [name], [name] ?Primary Care Lasonja Lakins: [name] ?Other Clinician: Donavan Burnet ?Referring Cristy Colmenares: ?Treating Kymani Shimabukuro/Extender: [name] ?[name] ?Weeks in Treatment: 4 ?Vital Signs ?Time Taken: 09:52 ?Temperature (??F): 97.7 ?Height (in): 74 ?Pulse (bpm): 89 ?Weight (lbs): 186 ?Respiratory Rate (breaths/min): 14 ?Body Mass Index (BMI): 23.9 ?Blood Pressure (mmHg): 102/71 ?Capillary Blood Glucose (mg/dl): 240 ?Reference Range: 80 - 120 mg / dl ?Electronic Signature(s) ?Signed: 09/24/2021 2:13:00 PM By: Donavan Burnet CHT EMT BS ?, , ?Entered By: Donavan Burnet on 09/23/2021 10:42:59 ?

## 2021-09-24 NOTE — Progress Notes (Signed)
[name], Colin A. ([id number]) ?Visit Report for 09/24/2021 ?Arrival Information Details ?Patient Name: Date of Service: ?NA Colin Nephew A. 09/24/2021 12:30 PM ?Medical Record Number: [id number] ?Patient Account Number: [id number] ?Date of Birth/Sex: Treating RN: ?24-Jul-1955 (66 y.o. M) ?Primary Care Iliyana Convey: [name] Other Clinician: ?Referring Worthy Boschert: ?Treating Nels Munn/Extender: [name] ?[name] ?Weeks in Treatment: 4 ?Visit Information History Since Last Visit ?Added or deleted any medications: No ?Patient Arrived: Knee Scooter ?Any new allergies or adverse reactions: No ?Arrival Time: 12:34 ?Had a fall or experienced change in No ?Accompanied By: self ?activities of daily living that may affect ?Transfer Assistance: None ?risk of falls: ?Patient Identification Verified: Yes ?Signs or symptoms of abuse/neglect since last visito No ?Secondary Verification Process Completed: Yes ?Hospitalized since last visit: No ?Patient Requires Transmission-Based Precautions: No ?Implantable device outside of the clinic excluding No ?Patient Has Alerts: Yes ?cellular tissue based products placed in the center ?Patient Alerts: Patient on Blood Thinner since last visit: ?PICC Line Right Arm Has Dressing in Place as Prescribed: Yes ?R ABI=1.1 TBI=.55 ?Has Compression in Place as Prescribed: Yes ?L ABI=.86 TBI=.36 ?Has Footwear/Offloading in Place as Prescribed: Yes ?Pain Present Now: No ?Electronic Signature(s) ?Signed: 09/24/2021 2:08:39 PM By: Samuella Bruin ?Entered By: Samuella Bruin on 09/24/2021 12:38:32 ?-------------------------------------------------------------------------------- ?Encounter Discharge Information Details ?Patient Name: Date of Service: ?NA Colin Nephew A. 09/24/2021 12:30 PM ?Medical Record Number: [id number] ?Patient Account Number: [id number] ?Date of Birth/Sex: Treating RN: ?10/11/1955 (66 y.o. M) ?Primary Care Adela Esteban: [name] Other Clinician: ?Referring Kaylie Ritter: ?Treating  Yaret Hush/Extender: [name] ?[name] ?Weeks in Treatment: 4 ?Encounter Discharge Information Items Post Procedure Vitals ?Discharge Condition: Stable ?Temperature (F): 98.3 ?Ambulatory Status: Knee Scooter ?Pulse (bpm): 92 ?Discharge Destination: Home ?Respiratory Rate (breaths/min): 14 ?Transportation: Private Auto ?Blood Pressure (mmHg): 147/82 ?Accompanied By: wife ?Schedule Follow-up Appointment: Yes ?Clinical Summary of Care: Patient Declined ?Electronic Signature(s) ?Signed: 09/24/2021 2:08:39 PM By: Samuella Bruin ?Entered By: Samuella Bruin on 09/24/2021 13:22:52 ?-------------------------------------------------------------------------------- ?Lower Extremity Assessment Details ?Patient Name: ?Date of Service: ?NA Colin Nephew A. 09/24/2021 12:30 PM ?Medical Record Number: [id number] ?Patient Account Number: [id number] ?Date of Birth/Sex: ?Treating RN: ?1956/06/05 (66 y.o. M) ?Primary Care Turrell Severt: [name] ?Other Clinician: ?Referring Ah Bott: ?Treating Kewana Sanon/Extender: [name] ?[name] ?Weeks in Treatment: 4 ?Edema Assessment ?Assessed: [Left: No] [Right: No] ?Edema: [Left: Ye] [Right: s] ?Calf ?Left: Right: ?Point of Measurement: From Medial Instep 36.4 cm ?Ankle ?Left: Right: ?Point of Measurement: From Medial Instep 22.6 cm ?Vascular Assessment ?Pulses: ?Dorsalis Pedis ?Palpable: [Left:Yes] ?Electronic Signature(s) ?Signed: 09/24/2021 2:08:39 PM By: Samuella Bruin ?Entered By: Samuella Bruin on 09/24/2021 12:43:50 ?-------------------------------------------------------------------------------- ?Multi Wound Chart Details ?Patient Name: ?Date of Service: ?NA Colin Nephew A. 09/24/2021 12:30 PM ?Medical Record Number: [id number] ?Patient Account Number: [id number] ?Date of Birth/Sex: ?Treating RN: ?1955/08/12 (66 y.o. M) ?Primary Care Jasten Guyette: [name] ?Other Clinician: ?Referring Ellie Bryand: ?Treating Dariyon Urquilla/Extender: [name] ?[name] ?Weeks in  Treatment: 4 ?Vital Signs ?Height(in): 74 ?Capillary Blood Glucose(mg/dl): 335 ?Weight(lbs): 186 ?Pulse(bpm): 92 ?Body Mass Index(BMI): 23.9 ?Blood Pressure(mmHg): 147/82 ?Temperature(??F): 98.3 ?Respiratory Rate(breaths/min): 14 ?Photos: [1:Left, Medial Foot] [N/A:N/A N/A] ?Wound Location: [1:Gradually Appeared] [N/A:N/A] ?Wounding Event: [1:Diabetic Wound/Ulcer of the Lower] [N/A:N/A] ?Primary Etiology: [1:Extremity Cataracts, Coronary Artery Disease, N/A] ?Comorbid History: [1:Hypertension, Myocardial Infarction, Peripheral Arterial Disease, Type II Diabetes, Osteomyelitis, Neuropathy 02/22/2021] [N/A:N/A] ?Date Acquired: [1:4] [N/A:N/A] ?Weeks of Treatment: [1:Open] [N/A:N/A] ?Wound Status: [1:No] [N/A:N/A] ?Wound Recurrence: [1:5.9x4.2x0.9] [N/A:N/A] ?Measurements L x W x D (cm) [1:19.462] [N/A:N/A] ?A (cm?) : ?  rea [1:17.516] [N/A:N/A] ?Volume (cm?) : [1:-37.70%] [N/A:N/A] ?% Reduction in A [1:rea: -23.90%] [N/A:N/A] ?% Reduction in Volume: [1:9] ?Position 1 (o'clock): [1:3.5] ?Maximum Distance 1 (cm): [1:12] ?Starting Position 1 (o'clock): [1:3] ?Ending Position 1 (o'clock): [1:3] ?Maximum Distance 1 (cm): [1:Yes] [N/A:N/A] ?Tunneling: [1:Yes] [N/A:N/A] ?Undermining: [1:Grade 3] [N/A:N/A] ?Classification: [1:Medium] [N/A:N/A] ?Exudate A mount: [1:Serosanguineous] [N/A:N/A] ?Exudate Type: [1:red, brown] [N/A:N/A] ?Exudate Color: [1:Distinct, outline attached] [N/A:N/A] ?Wound Margin: [1:Large (67-100%)] [N/A:N/A] ?Granulation A mount: [1:Red, Pink, Hyper-granulation] [N/A:N/A] ?Granulation Quality: [1:Small (1-33%)] [N/A:N/A] ?Necrotic A mount: ?[1:Fat Layer (Subcutaneous Tissue): Yes N/A] ?Exposed Structures: ?[1:Bone: Yes Fascia: No Tendon: No Muscle: No Joint: No None] [N/A:N/A] ?Epithelialization: [1:Debridement - Excisional] [N/A:N/A] ?Debridement: ?Pre-procedure Verification/Time Out 12:58 [N/A:N/A] ?Taken: [1:Other] [N/A:N/A] ?Pain Control: [1:Subcutaneous, Slough] [N/A:N/A] ?Tissue Debrided:  [1:Skin/Subcutaneous Tissue] [N/A:N/A] ?Level: [1:4] [N/A:N/A] ?Debridement A (sq cm): [1:rea Curette] [N/A:N/A] ?Instrument: [1:Moderate] [N/A:N/A] ?Bleeding: [1:Silver Nitrate] [N/A:N/A] ?Hemostasis A chieved: [1:0] [N/A:N/A] ?Procedural Pain: [1:0] [N/A:N/A] ?Post Procedural Pain: [1:Procedure was tolerated well] [N/A:N/A] ?Debridement Treatment Response: [1:5.9x4.2x0.9] [N/A:N/A] ?Post Debridement Measurements L x ?W x D (cm) [1:17.516] [N/A:N/A] ?Post Debridement Volume: (cm?) [1:Debridement] [N/A:N/A] ?Procedures Performed: [1:Negative Pressure Wound Therapy Maintenance (NPWT)] ?Treatment Notes ?Electronic Signature(s) ?Signed: 09/24/2021 1:08:57 PM By: [name] MD FACS ?Entered By: [name] on 09/24/2021 13:08:57 ?-------------------------------------------------------------------------------- ?Multi-Disciplinary Care Plan Details ?Patient Name: ?Date of Service: ?NA Colin Nephew A. 09/24/2021 12:30 PM ?Medical Record Number: [id number] ?Patient Account Number: [id number] ?Date of Birth/Sex: ?Treating RN: ?Oct 13, 1955 (65 y.o. M) ?Primary Care Jareb Radoncic: [name] ?Other Clinician: ?Referring Jex Strausbaugh: ?Treating Nykiah Ma/Extender: [name] ?[name] ?Weeks in Treatment: 4 ?Multidisciplinary Care Plan reviewed with physician ?Active Inactive ?HBO ?Nursing Diagnoses: ?Anxiety related to feelings of confinement associated with the hyperbaric oxygen chamber ?Anxiety related to knowledge deficit of hyperbaric oxygen therapy and treatment procedures ?Discomfort related to temperature and humidity changes inside hyperbaric chamber ?Potential for barotraumas to ears, sinuses, teeth, and lungs or cerebral gas embolism related to changes in atmospheric pressure inside hyperbaric oxygen ?chamber ?Potential for oxygen toxicity seizures related to delivery of 100% oxygen at an increased atmospheric pressure ?Potential for pulmonary oxygen toxicity related to delivery of 100% oxygen at an increased  atmospheric pressure ?Goals: ?Barotrauma will be prevented during HBO2 ?Date Initiated: 09/17/2021 ?T arget Resolution Date: 10/15/2021 ?Goal Status: Active ?Patient will tolerate the hyperbaric oxygen therapy treatment ?Date

## 2021-09-24 NOTE — Progress Notes (Signed)
[name], Colin A. ([id number]) ?Visit Report for 09/23/2021 ?HBO Details ?Patient Name: Date of Service: ?NA [name], [name] A. 09/23/2021 10:00 A M ?Medical Record Number: [id number] ?Patient Account Number: [id number] ?Date of Birth/Sex: Treating RN: ?26-Jun-1955 (66 y.o. M) [name], [name] ?Primary Care Daimien Patmon: [name] ?Other Clinician: [name] ?Referring Zelig Gacek: ?Treating Seaton Hofmann/Extender: [name] ?[name] ?Weeks in Treatment: 4 ?HBO Treatment Course Details ?Treatment Course Number: 1 ?Ordering Christien Berthelot: [name] ?T Treatments Ordered: ?otal 40 HBO Treatment Start Date: 09/20/2021 ?HBO Indication: ?Diabetic Ulcer(s) of the Lower Extremity ?Standard/Conservative Wound Care tried and failed greater than or equal to ?30 days ?Wound #1 Left, Medial Foot ?HBO Treatment Details ?Treatment Number: 4 ?Patient Type: Outpatient ?Chamber Type: Monoplace ?Chamber Serial #: [id number] ?Treatment Protocol: 2.5 ATA with 90 minutes oxygen, with two 5 minute air breaks ?Treatment Details ?Compression Rate Down: 1.5 psi / minute ?De-Compression Rate Up: 2.0 psi / minute ?A breaks and breathing ?ir ?Compress Tx Pressure periods Decompress Decompress ?Begins Reached (leave unused spaces Begins Ends ?blank) ?Chamber Pressure (ATA 1 2.5 2.5 2.5 2.5 2.5 - - 2.5 1 ?) ?Clock Time (24 hr) 09:53 10:08 10:38 10:43 11:13 11:27 - - 11:57 12:08 ?Treatment Length: 135 (minutes) ?Treatment Segments: 4 ?Vital Signs ?Capillary Blood Glucose Reference Range: 80 - 120 mg / dl ?HBO Diabetic Blood Glucose Intervention Range: <131 mg/dl or >132 mg/dl ?Time Vitals Blood Respiratory Capillary Blood Glucose Pulse Action ?Type: ?Pulse: Temperature: ?Taken: ?Pressure: ?Rate: ?Glucose (mg/dl): ?Meter #: Oximetry (%) Taken: ?Pre 09:52 102/71 89 14 97.7 240 2 ?Post 12:15 136/85 79 16 97.8 199 2 ?Treatment Response ?Treatment Toleration: Well ?Treatment Completion Status: Treatment Completed without Adverse Event ?Treatment  Notes ?Patient was safely placed in chamber after completing safety checklist. Chamber was compressed at a rate of 1 psi/min until reaching 2 psig at which point ?rate set was increased to 1.5 psi/min until reaching 15 psi at which point rate set was changed to 2 psi/min. During decompression the rate set was set at 2 ?psi/min. Patient tolerated travel well. ?Patient received 8 minutes 46 seconds extra air break due to a timer that was incorrectly set. Timer has been corrected and protocol in place to avoid in the ?future. Informed physician of this occurence. ?Additional Procedure Documentation ?Tissue Sevierity: Fat layer exposed ?Physician HBO Attestation: ?I certify that I supervised this HBO treatment in accordance with Medicare ?guidelines. A trained emergency response team is readily available per Yes ?hospital policies and procedures. ?Continue HBOT as ordered. Yes ?Electronic Signature(s) ?Signed: 09/23/2021 4:36:29 PM By: Colin Guess MD FACS ?Entered By: [name] on 09/23/2021 16:36:29 ?-------------------------------------------------------------------------------- ?HBO Safety Checklist Details ?Patient Name: ?Date of Service: ?NA [name], [name] A. 09/23/2021 10:00 A M ?Medical Record Number: [id number] ?Patient Account Number: [id number] ?Date of Birth/Sex: ?Treating RN: ?[birth year] (66 y.o. M) [name], [name] ?Primary Care Abisai Coble: [name] ?Other Clinician: [name] ?Referring Viyan Rosamond: ?Treating Jacquelinne Speak/Extender: [name] ?[name] ?Weeks in Treatment: 4 ?HBO Safety Checklist Items ?Safety Checklist ?Consent Form Signed ?Patient voided / foley secured and emptied ?When did you last eato 0815 ?Last dose of injectable or oral agent 0815 ?Ostomy pouch emptied and vented if applicable ?NA ?All implantable devices assessed, documented and approved ?NA ?Intravenous access site secured and place Right Forearm PICC ?Valuables secured ?Linens and cotton and cotton/polyester blend  (less than 51% polyester) ?Personal oil-based products / skin lotions / body lotions removed ?Wigs or hairpieces removed ?NA ?Smoking or tobacco materials removed ?NA ?Books / newspapers /  magazines / loose paper removed ?Cologne, aftershave, perfume and deodorant removed ?Jewelry removed (may wrap wedding band) ?Make-up removed ?NA ?Hair care products removed ?Battery operated devices (external) removed ?Heating patches and chemical warmers removed ?Titanium eyewear removed ?NA ?Nail polish cured greater than 10 hours ?NA ?Casting material cured greater than 10 hours ?NA ?Hearing aids removed ?NA ?Loose dentures or partials removed ?NA ?Prosthetics have been removed ?NA ?Patient demonstrates correct use of air break device (if applicable) ?Patient concerns have been addressed ?Patient grounding bracelet on and cord attached to chamber ?Specifics for Inpatients (complete in addition to above) ?Medication sheet sent with patient ?NA ?Intravenous medications needed or due during therapy sent with patient ?NA ?Drainage tubes (e.g. nasogastric tube or chest tube secured and vented) ?NA ?Endotracheal or Tracheotomy tube secured ?NA ?Cuff deflated of air and inflated with saline ?NA ?Airway suctioned ?NA ?Notes ?Paper version used prior to treatment. Wound-vac attachment port vented and covered with gauze. ?Electronic Signature(s) ?Signed: 09/24/2021 2:13:00 PM By: [name] CHT EMT BS ?, , ?Entered By: [name] on 09/24/2021 13:44:04 ?

## 2021-09-24 NOTE — Progress Notes (Signed)
HAMDI, KLEY A. ([id number]) ?Visit Report for 09/24/2021 ?Arrival Information Details ?Patient Name: Date of Service: ?[name], [name] 09/24/2021 10:00 A M ?Medical Record Number: [id number] ?Patient Account Number: [id number] ?Date of Birth/Sex: Treating RN: ?1956-05-10 (66 y.o. M) Elesa Hacker, [name] ?Primary Care Kennard Fildes: [name] ?Other Clinician: [name] ?Referring Ople Girgis: ?Treating Joseandres Mazer/Extender: [name] ?[name] ?Weeks in Treatment: 4 ?Visit Information History Since Last Visit ?All ordered tests and consults were completed: Yes ?Patient Arrived: Knee Scooter ?Added or deleted any medications: No ?Arrival Time: 09:14 ?Any new allergies or adverse reactions: No ?Accompanied By: self ?Had a fall or experienced change in No ?Transfer Assistance: None ?activities of daily living that may affect ?Patient Identification Verified: Yes ?risk of falls: ?Secondary Verification Process Completed: Yes ?Signs or symptoms of abuse/neglect since last visito No ?Patient Requires Transmission-Based Precautions: No ?Hospitalized since last visit: No ?Patient Has Alerts: Yes ?Implantable device outside of the clinic excluding No ?Patient Alerts: Patient on Blood Thinner cellular tissue based products placed in the center ?PICC Line Right Arm since last visit: ?R ABI=1.1 TBI=.55 ?Pain Present Now: No ?L ABI=.86 TBI=.36 ?Electronic Signature(s) ?Signed: 09/24/2021 2:13:00 PM By: [name] CHT EMT BS ?, , ?Entered By: [name] on 09/24/2021 13:24:17 ?-------------------------------------------------------------------------------- ?Encounter Discharge Information Details ?Patient Name: Date of Service: ?[name], [name] 09/24/2021 10:00 A M ?Medical Record Number: [id number] ?Patient Account Number: [id number] ?Date of Birth/Sex: Treating RN: ?August 09, 1955 (66 y.o. M) Elesa Hacker, [name] ?Primary Care Novia Lansberry: [name] ?Other Clinician: [name] ?Referring Alik Mawson: ?Treating  Kaci Freel/Extender: [name] ?[name] ?Weeks in Treatment: 4 ?Encounter Discharge Information Items ?Discharge Condition: Stable ?Ambulatory Status: Knee Scooter ?Discharge Destination: Home ?Transportation: Private Auto ?Accompanied By: self ?Schedule Follow-up Appointment: No ?Clinical Summary of Care: ?Electronic Signature(s) ?Signed: 09/24/2021 2:13:00 PM By: [name] CHT EMT BS ?, , ?Entered By: [name] on 09/24/2021 13:47:44 ?-------------------------------------------------------------------------------- ?Vitals Details ?Patient Name: ?Date of Service: ?[name], [name] 09/24/2021 10:00 A M ?Medical Record Number: [id number] ?Patient Account Number: [id number] ?Date of Birth/Sex: ?Treating RN: ?1956/05/14 (66 y.o. M) Elesa Hacker, [name] ?Primary Care Heidi Lemay: [name] ?Other Clinician: [name] ?Referring Treanna Dumler: ?Treating Jaxon Mynhier/Extender: [name] ?[name] ?Weeks in Treatment: 4 ?Vital Signs ?Time Taken: 09:37 ?Temperature (??F): 97.8 ?Height (in): 74 ?Pulse (bpm): 88 ?Weight (lbs): 186 ?Respiratory Rate (breaths/min): 14 ?Body Mass Index (BMI): 23.9 ?Blood Pressure (mmHg): 123/68 ?Capillary Blood Glucose (mg/dl): 448 ?Reference Range: 80 - 120 mg / dl ?Electronic Signature(s) ?Signed: 09/24/2021 2:13:00 PM By: [name] CHT EMT BS ?, , ?Entered By: [name] on 09/24/2021 13:25:06 ?

## 2021-09-24 NOTE — Progress Notes (Signed)
GLENFORD, GARIS A. ([id number]) ?Visit Report for 09/24/2021 ?HBO Details ?Patient Name: Date of Service: ?[name], [name] 09/24/2021 10:00 A M ?Medical Record Number: [id number] ?Patient Account Number: [id number] ?Date of Birth/Sex: Treating RN: ?09-Apr-1956 (66 y.o. M) Elesa Hacker, [name] ?Primary Care Alyria Krack: [name] ?Other Clinician: [name] ?Referring Evella Kasal: ?Treating Celine Dishman/Extender: [name] ?[name] ?Weeks in Treatment: 4 ?HBO Treatment Course Details ?Treatment Course Number: 1 ?Ordering Krystofer Hevener: [name] ?T Treatments Ordered: ?otal 40 HBO Treatment Start Date: 09/20/2021 ?HBO Indication: ?Diabetic Ulcer(s) of the Lower Extremity ?Standard/Conservative Wound Care tried and failed greater than or equal to ?30 days ?Wound #1 Left, Medial Foot ?HBO Treatment Details ?Treatment Number: 5 ?Patient Type: Outpatient ?Chamber Type: Monoplace ?Chamber Serial #: [id number] ?Treatment Protocol: 2.5 ATA with 90 minutes oxygen, with two 5 minute air breaks ?Treatment Details ?Compression Rate Down: 1.5 psi / minute ?De-Compression Rate Up: 2.0 psi / minute ?A breaks and breathing ?ir ?Compress Tx Pressure periods Decompress Decompress ?Begins Reached (leave unused spaces Begins Ends ?blank) ?Chamber Pressure (ATA 1 2.5 2.5 2.5 2.5 2.5 - - 2.5 1 ?) ?Clock Time (24 hr) 09:43 10:00 10:30 10:35 11:05 11:10 - - 11:40 11:51 ?Treatment Length: 128 (minutes) ?Treatment Segments: 4 ?Vital Signs ?Capillary Blood Glucose Reference Range: 80 - 120 mg / dl ?HBO Diabetic Blood Glucose Intervention Range: <131 mg/dl or >536 mg/dl ?Time Vitals Blood Respiratory Capillary Blood Glucose Pulse Action ?Type: ?Pulse: Temperature: ?Taken: ?Pressure: ?Rate: ?Glucose (mg/dl): ?Meter #: Oximetry (%) Taken: ?Pre 09:37 123/68 88 14 97.8 190 2 ?Post 12:01 138/77 77 14 98.2 285 2 ?Treatment Notes ?Patient was safely placed in chamber after completing safety checklist. Chamber was compressed at a rate of 1 psi/min until  reaching 2 psig at which point ?rate set was increased to 1.5 psi/min until reaching 15 psi at which point rate set was changed to 2 psi/min. Patient tolerated travel well at an average rate of ?1.3 psi/min. During decompression the rate set was set at 2 psi/min. Patient tolerated travel during decompression as well. ?Patient's glucose level was 285 mg/dL. Patient was asymptomatic for hyperglycemia stating he felt fine. ?Additional Procedure Documentation ?Tissue Sevierity: Necrosis of bone ?Physician HBO Attestation: ?I certify that I supervised this HBO treatment in accordance with Medicare ?guidelines. A trained emergency response team is readily available per Yes ?hospital policies and procedures. ?Continue HBOT as ordered. Yes ?Electronic Signature(s) ?Signed: 09/24/2021 1:40:27 PM By: [name] MD FACS ?Entered By: [name] on 09/24/2021 13:40:27 ?-------------------------------------------------------------------------------- ?HBO Safety Checklist Details ?Patient Name: ?Date of Service: ?[name], [name] 09/24/2021 10:00 A M ?Medical Record Number: [id number] ?Patient Account Number: [id number] ?Date of Birth/Sex: ?Treating RN: ?[birth year] (66 y.o. M) Elesa Hacker, [name] ?Primary Care Madex Seals: [name] ?Other Clinician: [name] ?Referring Ferris Tally: ?Treating Gareth Fitzner/Extender: [name] ?[name] ?Weeks in Treatment: 4 ?HBO Safety Checklist Items ?Safety Checklist ?Consent Form Signed ?Patient voided / foley secured and emptied ?When did you last eato 0900 ?Last dose of injectable or oral agent 0800 ?Ostomy pouch emptied and vented if applicable ?NA ?All implantable devices assessed, documented and approved ?NA ?Intravenous access site secured and place ?Valuables secured ?Linens and cotton and cotton/polyester blend (less than 51% polyester) ?Personal oil-based products / skin lotions / body lotions removed ?Wigs or hairpieces removed ?NA ?Smoking or tobacco materials  removed ?NA ?Books / newspapers / magazines / loose paper removed ?Cologne, aftershave, perfume and deodorant removed ?Jewelry removed (may wrap wedding band) ?Make-up removed ?NA ?Hair  care products removed ?NA ?Battery operated devices (external) removed ?Heating patches and chemical warmers removed ?Titanium eyewear removed ?Nail polish cured greater than 10 hours ?NA ?Casting material cured greater than 10 hours ?NA ?Hearing aids removed ?NA ?Loose dentures or partials removed ?NA ?Prosthetics have been removed ?NA ?Patient demonstrates correct use of air break device (if applicable) ?Patient concerns have been addressed ?Patient grounding bracelet on and cord attached to chamber ?Specifics for Inpatients (complete in addition to above) ?Medication sheet sent with patient ?NA ?Intravenous medications needed or due during therapy sent with patient ?NA ?Drainage tubes (e.g. nasogastric tube or chest tube secured and vented) ?NA ?Endotracheal or Tracheotomy tube secured ?NA ?Cuff deflated of air and inflated with saline ?NA ?Airway suctioned ?NA ?Notes ?Paper version used prior to treatment. Wound-vac attachment port vented and covered with gauze. ?Electronic Signature(s) ?Signed: 09/24/2021 2:13:00 PM By: [name] CHT EMT BS ?, , ?Entered By: [name] on 09/24/2021 13:27:15 ?

## 2021-09-24 NOTE — Progress Notes (Signed)
Colin, [name]. ([id number]) ?Visit Report for 09/24/2021 ?Chief Complaint Document Details ?Patient Name: Date of Service: ?[name]. 09/24/2021 12:30 PM ?Medical Record Number: [id number] ?Patient Account Number: [id number] ?Date of Birth/Sex: Treating RN: ?August 02, 1955 (66 y.o. M) ?Primary Care Provider: [name] Other Clinician: ?Referring Provider: ?Treating Provider/Extender: [name] ?[name] ?Weeks in Treatment: 4 ?Information Obtained from: Patient ?Chief Complaint ?Patients presents for treatment of an open diabetic ulcer with exposed bone and osteomyelitis ?Electronic Signature(s) ?Signed: 09/24/2021 1:09:06 PM By: [name] MD FACS ?Entered By: [name] on 09/24/2021 13:09:05 ?-------------------------------------------------------------------------------- ?Debridement Details ?Patient Name: Date of Service: ?[name]. 09/24/2021 12:30 PM ?Medical Record Number: [id number] ?Patient Account Number: [id number] ?Date of Birth/Sex: Treating RN: ?May 19, 1956 (66 y.o. M) ?Primary Care Provider: [name] Other Clinician: ?Referring Provider: ?Treating Provider/Extender: [name] ?[name] ?Weeks in Treatment: 4 ?Debridement Performed for Assessment: Wound #1 Left,Medial Foot ?Performed By: Physician [name], MD ?Debridement Type: Debridement ?Severity of Tissue Pre Debridement: Necrosis of bone ?Level of Consciousness (Pre-procedure): Awake and Alert ?Pre-procedure Verification/Time Out Yes - 12:58 ?Taken: ?Start Time: 12:58 ?Pain Control: ?Other : benzocaine 20% spray ?T Area Debrided (L x W): ?otal 2 (cm) x 2 (cm) = 4 (cm?) ?Tissue and other material debrided: Viable, Non-Viable, Slough, Subcutaneous, Slough ?Level: Skin/Subcutaneous Tissue ?Debridement Description: Excisional ?Instrument: Curette ?Bleeding: Moderate ?Hemostasis Achieved: Silver Nitrate ?Procedural Pain: 0 ?Post Procedural Pain: 0 ?Response to Treatment: Procedure was tolerated  well ?Level of Consciousness (Post- Awake and Alert ?procedure): ?Post Debridement Measurements of Total Wound ?Length: (cm) 5.9 ?Width: (cm) 4.2 ?Depth: (cm) 0.9 ?Volume: (cm?) 17.516 ?Character of Wound/Ulcer Post Debridement: Improved ?Severity of Tissue Post Debridement: Necrosis of bone ?Post Procedure Diagnosis ?Same as Pre-procedure ?Electronic Signature(s) ?Signed: 09/24/2021 2:08:39 PM By: Samuella Bruin ?Signed: 09/24/2021 5:31:06 PM By: [name] MD FACS ?Entered By: Samuella Bruin on 09/24/2021 13:03:43 ?-------------------------------------------------------------------------------- ?HPI Details ?Patient Name: Date of Service: ?[name]. 09/24/2021 12:30 PM ?Medical Record Number: [id number] ?Patient Account Number: [id number] ?Date of Birth/Sex: Treating RN: ?Aug 20, 1955 (66 y.o. M) ?Primary Care Provider: [name] Other Clinician: ?Referring Provider: ?Treating Provider/Extender: [name] ?[name] ?Weeks in Treatment: 4 ?History of Present Illness ?HPI Description: ADMISSION ?08/25/2021 ?This is [name] 66 year old man who initially presented to his primary care provider in September 2022 with pain in his left foot. He was sent for an x-ray and while ?the x-ray was being performed, the tech pointed out [name] wound on his foot that the patient was not aware existed. He does have type 2 diabetes with significant ?neuropathy. His diabetes is suboptimally controlled with his most recent A1c being 8.5. He also has [name] history of coronary artery disease status post three- ?vessel CABG. he was initially seen by orthopedics, but they referred him to [facility]. He has undergone at least 7 ?operations/debridements and several applications of skin substitute under the care of podiatry. He has been in [name] for much of this time. His most ?recent procedure was July 28, 2021. [name] was biopsied and was found to be consistent with osteomyelitis.  Culture also returned positive ?for corynebacterium. He was seen on August 16, 2021 by infectious disease. [name] PICC line has been placed and he will be receiving [name] 6-week course of IV ?daptomycin and cefepime. In October 2022, he underwent lower extremity vascular studies. Results are copied here: ?Right: Resting right ankle-brachial index is within normal  range. No ?evidence of significant right lower extremity arterial disease. The right ?toe-brachial index is abnormal. ?Left: Resting left ankle-brachial index indicates mild left lower ?extremity arterial disease. The left toe-brachial index is abnormal. ?He has not been seen by vascular surgery despite these findings. ?He presented to clinic today in [name] and is using [name] to offload. Wound VAC was in place. Once this was removed, [name] large ulcer was ?identified on the left midfoot/ankle. Bone is frankly exposed. There is no malodorous or purulent drainage. There is some granulation tissue over the central ?portion of the exposed bone. There is [name] tunnel that extends posteriorly for roughly 10 cm. ?It has been discussed with him by multiple providers that he is at very high risk of losing his lower leg because of this wound. He is extremely eager to avoid ?this outcome and is here today to review his options as well as receive ongoing wound care. ?09/03/2021: Here for reevaluation of his wound. There does not appear to have been any substantial improvement overall since our last visit. He has been in [name] ?wound VAC with white foam overlying the exposed bone. We are working on getting him approved for hyperbaric oxygen therapy. ?09/10/2021: We are in the process of getting him cleared to begin hyperbaric oxygen therapy. He still needs to obtain [name] chest x-ray. Although the wound ?measurements are roughly the same, I think the overall appearance of the wound is better. The exposed bone has [name] bit more granulation tissue covering it. He ?has not received [name]  vascular surgery appointment to reevaluate his flow to the wound. ?09/17/2021: He has been approved for hyperbaric oxygen therapy and completed his chest x-ray, which I reviewed and it appears normal. The tunnels at the 12 ?and 10:00 positions are smaller. There is more granulation tissue covering the exposed bone and the undermining has decreased. He still has not received [name] ?vascular surgery appointment. ?09/24/2021: He initiated hyperbaric oxygen therapy this week and is tolerating it well. He has an appointment with vascular surgery coming up on May 16. The ?granulation tissue is covering more of the exposed bone and both tunnels are [name] bit smaller. ?Electronic Signature(s) ?Signed: 09/24/2021 1:09:55 PM By: [name] MD FACS ?Entered By: [name] on 09/24/2021 13:09:54 ?-------------------------------------------------------------------------------- ?Physical Exam Details ?Patient Name: Date of Service: ?[name]. 09/24/2021 12:30 PM ?Medical Record Number: [id number] ?Patient Account Number: [id number] ?Date of Birth/Sex: Treating RN: ?[birth year] (66 y.o. M) ?Primary Care Provider: [name] Other Clinician: ?Referring Provider: ?Treating Provider/Extender: [name] ?[name] ?Weeks in Treatment: 4 ?Constitutional ?. . . . No acute distress. ?Respiratory ?Normal work of breathing on room air. ?Notes ?09/24/2021: Granulation tissue continues to migrate over the exposed bone. The tunnels at 12 and 10:00 are smaller and undermining continues to decrease. All ?drainage is serosanguineous. There is [name] bit of necrotic tissue at the 12 o'clock position near the tunnel, as well as just behind the exposed bone at the 10 ?o'clock position. ?Electronic Signature(s) ?Signed: 09/24/2021 1:12:23 PM By: [name] MD FACS ?Entered By: [name] on 09/24/2021 13:12:23 ?-------------------------------------------------------------------------------- ?Physician Orders Details ?Patient Name:  Date of Service: ?[name]. 09/24/2021 12:30 PM ?Medical Record Number: [id number] ?Patient Account Number: [id number] ?Date of Birth/Sex: Treating RN: ?20-Jul-1955 (66 y.o. M) ?Primary Care Provider: Lurline Idol

## 2021-09-25 DIAGNOSIS — T8131XA Disruption of external operation (surgical) wound, not elsewhere classified, initial encounter: Secondary | ICD-10-CM | POA: Diagnosis not present

## 2021-09-25 DIAGNOSIS — I739 Peripheral vascular disease, unspecified: Secondary | ICD-10-CM | POA: Diagnosis not present

## 2021-09-26 DIAGNOSIS — I739 Peripheral vascular disease, unspecified: Secondary | ICD-10-CM | POA: Diagnosis not present

## 2021-09-26 DIAGNOSIS — T8131XA Disruption of external operation (surgical) wound, not elsewhere classified, initial encounter: Secondary | ICD-10-CM | POA: Diagnosis not present

## 2021-09-27 ENCOUNTER — Ambulatory Visit: Payer: BC Managed Care – PPO | Admitting: Infectious Disease

## 2021-09-27 ENCOUNTER — Encounter (HOSPITAL_BASED_OUTPATIENT_CLINIC_OR_DEPARTMENT_OTHER): Payer: BC Managed Care – PPO | Admitting: General Surgery

## 2021-09-27 DIAGNOSIS — E11621 Type 2 diabetes mellitus with foot ulcer: Secondary | ICD-10-CM | POA: Diagnosis not present

## 2021-09-27 DIAGNOSIS — E785 Hyperlipidemia, unspecified: Secondary | ICD-10-CM | POA: Diagnosis not present

## 2021-09-27 DIAGNOSIS — L97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: Secondary | ICD-10-CM | POA: Diagnosis not present

## 2021-09-27 DIAGNOSIS — I10 Essential (primary) hypertension: Secondary | ICD-10-CM | POA: Diagnosis not present

## 2021-09-27 DIAGNOSIS — Z794 Long term (current) use of insulin: Secondary | ICD-10-CM | POA: Diagnosis not present

## 2021-09-27 DIAGNOSIS — Z7982 Long term (current) use of aspirin: Secondary | ICD-10-CM | POA: Diagnosis not present

## 2021-09-27 DIAGNOSIS — I739 Peripheral vascular disease, unspecified: Secondary | ICD-10-CM | POA: Diagnosis not present

## 2021-09-27 DIAGNOSIS — M1990 Unspecified osteoarthritis, unspecified site: Secondary | ICD-10-CM | POA: Diagnosis not present

## 2021-09-27 DIAGNOSIS — M86672 Other chronic osteomyelitis, left ankle and foot: Secondary | ICD-10-CM | POA: Diagnosis not present

## 2021-09-27 DIAGNOSIS — I252 Old myocardial infarction: Secondary | ICD-10-CM | POA: Diagnosis not present

## 2021-09-27 DIAGNOSIS — L97522 Non-pressure chronic ulcer of other part of left foot with fat layer exposed: Secondary | ICD-10-CM | POA: Diagnosis not present

## 2021-09-27 DIAGNOSIS — Z452 Encounter for adjustment and management of vascular access device: Secondary | ICD-10-CM | POA: Diagnosis not present

## 2021-09-27 DIAGNOSIS — D649 Anemia, unspecified: Secondary | ICD-10-CM | POA: Diagnosis not present

## 2021-09-27 DIAGNOSIS — Z7984 Long term (current) use of oral hypoglycemic drugs: Secondary | ICD-10-CM | POA: Diagnosis not present

## 2021-09-27 DIAGNOSIS — Z792 Long term (current) use of antibiotics: Secondary | ICD-10-CM | POA: Diagnosis not present

## 2021-09-27 DIAGNOSIS — I2510 Atherosclerotic heart disease of native coronary artery without angina pectoris: Secondary | ICD-10-CM | POA: Diagnosis not present

## 2021-09-27 DIAGNOSIS — K219 Gastro-esophageal reflux disease without esophagitis: Secondary | ICD-10-CM | POA: Diagnosis not present

## 2021-09-27 DIAGNOSIS — E1140 Type 2 diabetes mellitus with diabetic neuropathy, unspecified: Secondary | ICD-10-CM | POA: Diagnosis not present

## 2021-09-27 DIAGNOSIS — E11610 Type 2 diabetes mellitus with diabetic neuropathic arthropathy: Secondary | ICD-10-CM | POA: Diagnosis not present

## 2021-09-27 DIAGNOSIS — Z951 Presence of aortocoronary bypass graft: Secondary | ICD-10-CM | POA: Diagnosis not present

## 2021-09-27 DIAGNOSIS — T8131XA Disruption of external operation (surgical) wound, not elsewhere classified, initial encounter: Secondary | ICD-10-CM | POA: Diagnosis not present

## 2021-09-27 LAB — GLUCOSE, CAPILLARY
Glucose-Capillary: 306 mg/dL — ABNORMAL HIGH (ref 70–99)
Glucose-Capillary: 273 mg/dL — ABNORMAL HIGH (ref 70–99)

## 2021-09-27 NOTE — Progress Notes (Signed)
[name], Colin A. ([id number]) ?Visit Report for 09/27/2021 ?Arrival Information Details ?Patient Name: Date of Service: ?[name], [name] 09/27/2021 10:00 A M ?Medical Record Number: [id number] ?Patient Account Number: [id number] ?Date of Birth/Sex: Treating RN: ?06/02/1956 (66 y.o. [name] ?Primary Care Karys Meckley: [name] ?Other Clinician: [name] ?Referring Kaleem Sartwell: ?Treating Markhi Kleckner/Extender: [name] ?[name] ?Weeks in Treatment: 4 ?Visit Information History Since Last Visit ?All ordered tests and consults were completed: Yes ?Patient Arrived: Knee Scooter ?Added or deleted any medications: No ?Arrival Time: 09:18 ?Any new allergies or adverse reactions: No ?Accompanied By: Wife ?Had a fall or experienced change in No ?Transfer Assistance: None ?activities of daily living that may affect ?Patient Identification Verified: Yes ?risk of falls: ?Secondary Verification Process Completed: Yes ?Signs or symptoms of abuse/neglect since last visito No ?Patient Requires Transmission-Based Precautions: No ?Hospitalized since last visit: No ?Patient Has Alerts: Yes ?Implantable device outside of the clinic excluding No ?Patient Alerts: Patient on Blood Thinner cellular tissue based products placed in the center ?since last visit: ?Pain Present Now: No ?Notes ?Paper version used prior to treatment. ?Electronic Signature(s) ?Signed: 09/27/2021 3:57:30 PM By: [name] EMT ?Entered By: [name] on 09/27/2021 15:57:29 ?-------------------------------------------------------------------------------- ?Encounter Discharge Information Details ?Patient Name: Date of Service: ?[name], [name] 09/27/2021 10:00 A M ?Medical Record Number: [id number] ?Patient Account Number: [id number] ?Date of Birth/Sex: Treating RN: ?07-Jul-1955 (66 y.o. [name] ?Primary Care Sequita Wise: [name] ?Other Clinician: [name] ?Referring Ioana Louks: ?Treating Masaye Gatchalian/Extender: [name] ?[name] ?Weeks in Treatment: 4 ?Encounter Discharge Information Items ?Discharge Condition: Stable ?Ambulatory Status: Knee Scooter ?Discharge Destination: Home ?Transportation: Private Auto ?Accompanied By: Wife ?Schedule Follow-up Appointment: Yes ?Clinical Summary of Care: ?Electronic Signature(s) ?Signed: 09/27/2021 4:19:57 PM By: [name] EMT ?Entered By: [name] on 09/27/2021 16:19:56 ?-------------------------------------------------------------------------------- ?Vitals Details ?Patient Name: ?Date of Service: ?[name], [name] 09/27/2021 10:00 A M ?Medical Record Number: [id number] ?Patient Account Number: [id number] ?Date of Birth/Sex: ?Treating RN: ?1956-04-17 (66 y.o. [name] ?Primary Care Wash Nienhaus: [name] ?Other Clinician: [name] ?Referring Bethel Sirois: ?Treating Kimila Papaleo/Extender: [name] ?[name] ?Weeks in Treatment: 4 ?Vital Signs ?Time Taken: 09:38 ?Temperature (??F): 97.7 ?Height (in): 74 ?Pulse (bpm): 81 ?Weight (lbs): 186 ?Respiratory Rate (breaths/min): 16 ?Body Mass Index (BMI): 23.9 ?Blood Pressure (mmHg): 128/77 ?Capillary Blood Glucose (mg/dl): 741 ?Reference Range: 80 - 120 mg / dl ?Notes ?I Dr.Cannon informed of his Blood sugar. ?Electronic Signature(s) ?Signed: 09/27/2021 4:00:11 PM By: [name] EMT ?Entered By: [name] on 09/27/2021 16:00:10 ?

## 2021-09-27 NOTE — Progress Notes (Addendum)
[name], Colin A. ([id number]) ?Visit Report for 09/27/2021 ?HBO Details ?Patient Name: Date of Service: ?[name], [name] 09/27/2021 10:00 A M ?Medical Record Number: [id number] ?Patient Account Number: [id number] ?Date of Birth/Sex: Treating RN: ?22-Feb-1956 (66 y.o. [name] ?Primary Care [name]: [name] ?Other Clinician: Valeria [name] ?Referring Shahiem Bedwell: ?Treating Kareem Cathey/Extender: [name] ?[name] ?Weeks in Treatment: 4 ?HBO Treatment Course Details ?Treatment Course Number: 1 ?Ordering Alaa Mullally: [name] ?T Treatments Ordered: ?otal 40 HBO Treatment Start Date: 09/20/2021 ?HBO Indication: ?Diabetic Ulcer(s) of the Lower Extremity ?Standard/Conservative Wound Care tried and failed greater than or equal to ?30 days ?Wound #1 Left, Medial Foot ?HBO Treatment Details ?Treatment Number: 6 ?Patient Type: Outpatient ?Chamber Type: Monoplace ?Chamber Serial #: [id number] ?Treatment Protocol: 2.5 ATA with 90 minutes oxygen, with two 5 minute air breaks ?Treatment Details ?Compression Rate Down: 2.0 psi / minute ?De-Compression Rate Up: 2.0 psi / minute ?A breaks and breathing ?ir ?Compress Tx Pressure periods Decompress Decompress ?Begins Reached (leave unused spaces Begins Ends ?blank) ?Chamber Pressure (ATA 1 2.5 2.5 2.5 2.5 2.5 - - 2.5 1 ?) ?Clock Time (24 hr) 09:48 10:02 10:32 10:37 11:07 11:13 - - 11:43 11:55 ?Treatment Length: 127 (minutes) ?Treatment Segments: 4 ?Vital Signs ?Capillary Blood Glucose Reference Range: 80 - 120 mg / dl ?HBO Diabetic Blood Glucose Intervention Range: <131 mg/dl or >249 mg/dl ?Time Vitals Blood Respiratory Capillary Blood Glucose Pulse Action ?Type: ?Pulse: Temperature: ?Taken: ?Pressure: ?Rate: ?Glucose (mg/dl): ?Meter #: Oximetry (%) Taken: ?Pre 09:38 128/77 81 16 97.7 306 ?Post 12:04 136/84 75 16 97.6 273 ?Treatment Response ?Treatment Toleration: Well ?Treatment Completion Status: Treatment Completed without Adverse Event ?Treatment Notes ?The patient  was asked before his treatment if he had any problems today with his sinus or ear. He stated no. Chamber compressed at a rate of 1.5 PSI/min to ?4 PSI. The patient was able to clear. The compression rate increased to 2.0 PSI/min to 2.5 ATA. The patient had no problems. His treatment went well, with no ?problems today. Decompression rate at 2.0 PSI/min. After his treatment, I asked him about his ears and sinus. He stated that he was OK. ?Additional Procedure Documentation ?Tissue Sevierity: Fat layer exposed ?Physician HBO Attestation: ?I certify that I supervised this HBO treatment in accordance with Medicare ?guidelines. A trained emergency response team is readily available per Yes ?hospital policies and procedures. ?Continue HBOT as ordered. Yes ?Electronic Signature(s) ?Signed: 09/28/2021 1:57:44 PM By: [name] EMT ?Signed: 09/28/2021 3:53:24 PM By: [name] MD FACS ?Signed: 09/28/2021 3:53:24 PM By: [name] MD FACS ?Previous Signature: 09/27/2021 4:46:18 PM Version By: [name] MD FACS ?Previous Signature: 09/27/2021 4:17:08 PM Version By: [name] EMT ?Entered By: [name] on 09/28/2021 13:57:44 ?-------------------------------------------------------------------------------- ?HBO Safety Checklist Details ?Patient Name: ?Date of Service: ?[name], [name] 09/27/2021 10:00 A M ?Medical Record Number: [id number] ?Patient Account Number: [id number] ?Date of Birth/Sex: ?Treating RN: ?04-26-1956 (66 y.o. [name] ?Primary Care Viktor Philipp: [name] ?Other Clinician: Valeria [name] ?Referring Wai Litt: ?Treating Davia Smyre/Extender: [name] ?[name] ?Weeks in Treatment: 4 ?HBO Safety Checklist Items ?Safety Checklist ?Consent Form Signed ?Patient voided / foley secured and emptied ?When did you last eato 0645 ?Last dose of injectable or oral agent 0700 ?Ostomy pouch emptied and vented if applicable ?NA ?All implantable devices assessed, documented and  approved ?NA ?Intravenous access site secured and place PIC line right arm ?Valuables secured ?Linens and cotton and cotton/polyester blend (less than 51% polyester) ?  Personal oil-based products / skin lotions / body lotions removed ?Wigs or hairpieces removed ?NA ?Smoking or tobacco materials removed ?Books / newspapers / magazines / loose paper removed ?Cologne, aftershave, perfume and deodorant removed ?Jewelry removed (may wrap wedding band) ?Make-up removed ?NA ?Hair care products removed ?Battery operated devices (external) removed ?Heating patches and chemical warmers removed ?Titanium eyewear removed ?NA ?Nail polish cured greater than 10 hours ?NA ?Casting material cured greater than 10 hours ?NA ?Hearing aids removed ?NA ?Loose dentures or partials removed ?NA ?Prosthetics have been removed ?NA ?Patient demonstrates correct use of air break device (if applicable) ?Patient concerns have been addressed ?Patient grounding bracelet on and cord attached to chamber ?Specifics for Inpatients (complete in addition to above) ?Medication sheet sent with patient ?NA ?Intravenous medications needed or due during therapy sent with patient ?NA ?Drainage tubes (e.g. nasogastric tube or chest tube secured and vented) ?NA ?Endotracheal or Tracheotomy tube secured ?NA ?Cuff deflated of air and inflated with saline ?NA ?Airway suctioned ?NA ?Notes ?Paper version used prior to treatment. The checklist was done before treatment started. ?Electronic Signature(s) ?Signed: 09/27/2021 4:09:15 PM By: [name] EMT ?Entered By: [name] on 09/27/2021 16:09:15 ?

## 2021-09-27 NOTE — Telephone Encounter (Signed)
Patient called wanting to know if PICC line will be removed. Advised that antibiotics have been extended to 4/19, the day of his appointment, and that a decision about the PICC line will be discussed then. ? ?Beryle Flock, RN ? ?

## 2021-09-27 NOTE — Progress Notes (Signed)
[name], Colin A. ([id number]) ?Visit Report for 09/27/2021 ?Problem List Details ?Patient Name: Date of Service: ?[name], [name] 09/27/2021 10:00 A M ?Medical Record Number: [id number] ?Patient Account Number: [id number] ?Date of Birth/Sex: Treating RN: ?12/08/1955 (66 y.o. [name] ?Primary Care Provider: [name] ?Other Clinician: [name] ?Referring Provider: ?Treating Provider/Extender: [name] ?[name] ?Weeks in Treatment: 4 ?Active Problems ?ICD-10 ?Encounter ?Code Description Active Date MDM ?Diagnosis ?X54.008 Other chronic osteomyelitis, left ankle and foot 08/25/2021 No Yes ?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 08/25/2021 No Yes ?E11.621 Type 2 diabetes mellitus with foot ulcer 08/25/2021 No Yes ?I25.10 Atherosclerotic heart disease of native coronary artery without angina pectoris 08/25/2021 No Yes ?Q76.195 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 09/27/2021 ?Medical Record Number: [id number] ?Patient Account Number: [id number] ?Date of Birth/Sex: Treating RN: ?Dec 03, 1955 (66 y.o. 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Modifier: ?Quantity: 4 ?Physician Procedures ?: CPT4 Code Description Modifier [id number] [phone] - WC PHYS HYPERBARIC OXYGEN THERAPY ICD-10 Diagnosis Description E11.621 Type 2 diabetes mellitus with foot ulcer 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

## 2021-09-28 ENCOUNTER — Encounter (HOSPITAL_BASED_OUTPATIENT_CLINIC_OR_DEPARTMENT_OTHER): Payer: BC Managed Care – PPO | Admitting: General Surgery

## 2021-09-28 DIAGNOSIS — L97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: Secondary | ICD-10-CM | POA: Diagnosis not present

## 2021-09-28 DIAGNOSIS — Z7984 Long term (current) use of oral hypoglycemic drugs: Secondary | ICD-10-CM | POA: Diagnosis not present

## 2021-09-28 DIAGNOSIS — E1140 Type 2 diabetes mellitus with diabetic neuropathy, unspecified: Secondary | ICD-10-CM | POA: Diagnosis not present

## 2021-09-28 DIAGNOSIS — E11621 Type 2 diabetes mellitus with foot ulcer: Secondary | ICD-10-CM | POA: Diagnosis not present

## 2021-09-28 DIAGNOSIS — Z794 Long term (current) use of insulin: Secondary | ICD-10-CM | POA: Diagnosis not present

## 2021-09-28 DIAGNOSIS — I252 Old myocardial infarction: Secondary | ICD-10-CM | POA: Diagnosis not present

## 2021-09-28 DIAGNOSIS — I739 Peripheral vascular disease, unspecified: Secondary | ICD-10-CM | POA: Diagnosis not present

## 2021-09-28 DIAGNOSIS — E11610 Type 2 diabetes mellitus with diabetic neuropathic arthropathy: Secondary | ICD-10-CM | POA: Diagnosis not present

## 2021-09-28 DIAGNOSIS — Z792 Long term (current) use of antibiotics: Secondary | ICD-10-CM | POA: Diagnosis not present

## 2021-09-28 DIAGNOSIS — I2510 Atherosclerotic heart disease of native coronary artery without angina pectoris: Secondary | ICD-10-CM | POA: Diagnosis not present

## 2021-09-28 DIAGNOSIS — K219 Gastro-esophageal reflux disease without esophagitis: Secondary | ICD-10-CM | POA: Diagnosis not present

## 2021-09-28 DIAGNOSIS — I10 Essential (primary) hypertension: Secondary | ICD-10-CM | POA: Diagnosis not present

## 2021-09-28 DIAGNOSIS — Z951 Presence of aortocoronary bypass graft: Secondary | ICD-10-CM | POA: Diagnosis not present

## 2021-09-28 DIAGNOSIS — Z452 Encounter for adjustment and management of vascular access device: Secondary | ICD-10-CM | POA: Diagnosis not present

## 2021-09-28 DIAGNOSIS — M86672 Other chronic osteomyelitis, left ankle and foot: Secondary | ICD-10-CM | POA: Diagnosis not present

## 2021-09-28 DIAGNOSIS — E785 Hyperlipidemia, unspecified: Secondary | ICD-10-CM | POA: Diagnosis not present

## 2021-09-28 DIAGNOSIS — T8131XA Disruption of external operation (surgical) wound, not elsewhere classified, initial encounter: Secondary | ICD-10-CM | POA: Diagnosis not present

## 2021-09-28 DIAGNOSIS — D649 Anemia, unspecified: Secondary | ICD-10-CM | POA: Diagnosis not present

## 2021-09-28 DIAGNOSIS — M1990 Unspecified osteoarthritis, unspecified site: Secondary | ICD-10-CM | POA: Diagnosis not present

## 2021-09-28 DIAGNOSIS — L97522 Non-pressure chronic ulcer of other part of left foot with fat layer exposed: Secondary | ICD-10-CM | POA: Diagnosis not present

## 2021-09-28 DIAGNOSIS — Z7982 Long term (current) use of aspirin: Secondary | ICD-10-CM | POA: Diagnosis not present

## 2021-09-28 LAB — GLUCOSE, CAPILLARY
Glucose-Capillary: 112 mg/dL — ABNORMAL HIGH (ref 70–99)
Glucose-Capillary: 129 mg/dL — ABNORMAL HIGH (ref 70–99)
Glucose-Capillary: 146 mg/dL — ABNORMAL HIGH (ref 70–99)

## 2021-09-28 NOTE — Progress Notes (Addendum)
UGONNA, KEIRSEY A. ([id number]) ?Visit Report for 09/28/2021 ?HBO Details ?Patient Name: Date of Service: ?[name], [name] 09/28/2021 10:00 A M ?Medical Record Number: [id number] ?Patient Account Number: [id number] ?Date of Birth/Sex: Treating RN: ?06/20/1955 (66 y.o. Elizebeth KollerElizebeth Koller ?Primary Care Jaunita Mikels: [name] ?Other Clinician: [name] ?Referring Tomi Grandpre: ?Treating Mivaan Corbitt/Extender: [name] ?[name] ?Weeks in Treatment: 4 ?HBO Treatment Course Details ?Treatment Course Number: 1 ?Ordering Sarahelizabeth Conway: [name] ?T Treatments Ordered: ?otal 40 HBO Treatment Start Date: 09/20/2021 ?HBO Indication: ?Diabetic Ulcer(s) of the Lower Extremity ?Standard/Conservative Wound Care tried and failed greater than or equal to ?30 days ?Wound #1 Left, Medial Foot ?HBO Treatment Details ?Treatment Number: 7 ?Patient Type: Outpatient ?Chamber Type: Monoplace ?Chamber Serial #: [id number] ?Treatment Protocol: 2.5 ATA with 90 minutes oxygen, with two 5 minute air breaks ?Treatment Details ?Compression Rate Down: 2.0 psi / minute ?De-Compression Rate Up: 2.0 psi / minute ?A breaks and breathing ?ir ?Compress Tx Pressure periods Decompress Decompress ?Begins Reached (leave unused spaces Begins Ends ?blank) ?Chamber Pressure (ATA 1 2.5 2.5 2.5 2.5 2.5 - - 2.5 1 ?) ?Clock Time (24 hr) 10:30 10:42 11:12 11:17 11:47 11:52 - - 12:22 12:35 ?Treatment Length: 125 (minutes) ?Treatment Segments: 4 ?Vital Signs ?Capillary Blood Glucose Reference Range: 80 - 120 mg / dl ?HBO Diabetic Blood Glucose Intervention Range: <131 mg/dl or >034 mg/dl ?Type: Time Vitals Blood Pulse: Respiratory Temperature: Capillary Blood Glucose Pulse Action ?Taken: Pressure: Rate: Glucose (mg/dl): Meter #: Oximetry (%) Taken: ?Pre 10:01 129/84 73 16 97.5 112 Patient giver 8oz of Glucerna to drink ?Post 12:38 151/89 74 16 97.7 146 ?Pre 10:17 129 Spoke with Dr. Lady Gary For CGB of 129 ?Treatment Response ?Treatment Toleration: Well ?Treatment  Completion Status: Treatment Completed without Adverse Event ?Treatment Notes ?The patient was given 8oz of Glucerna to drink, after CBG of 112. I waited 30 min and rechecked CBG. Spoke with Dr. Lady Gary about CBG of 129 after he was ?given 8oz of Glucerna to drink. Dr. Lady Gary stated to go ahead with his treatment. The patient was asked before his treatment if he had any problems today ?with his sinus or ear. He stated no. Chamber compressed at a rate of 1.5 PSI/min to 4 PSI. The patient was able to clear. The compression rate increased to ?2.0 PSI/min to 2.5 ATA. The patient had no problems. His treatment went well, with no problems today. Decompression rate at 2.0 PSI/min. After his treatment, ?I asked him about his ears and sinus. He stated that he was OK. ?Additional Procedure Documentation ?Tissue Sevierity: Necrosis of bone ?Physician HBO Attestation: ?I certify that I supervised this HBO treatment in accordance with Medicare ?guidelines. A trained emergency response team is readily available per Yes ?hospital policies and procedures. ?Continue HBOT as ordered. Yes ?Electronic Signature(s) ?Signed: 09/28/2021 3:56:00 PM By: [name] MD FACS ?Previous Signature: 09/28/2021 1:58:18 PM Version By: [name] EMT ?Previous Signature: 09/28/2021 1:56:43 PM Version By: [name] EMT ?Entered By: [name] on 09/28/2021 15:55:59 ?-------------------------------------------------------------------------------- ?HBO Safety Checklist Details ?Patient Name: ?Date of Service: ?[name], [name] 09/28/2021 10:00 A M ?Medical Record Number: [id number] ?Patient Account Number: [id number] ?Date of Birth/Sex: ?Treating RN: ?April 21, 1956 (66 y.o. [name] ?Primary Care Baily Serpe: [name] ?Other Clinician: [name] ?Referring Tommie Dejoseph: ?Treating Tailor Westfall/Extender: [name] ?[name] ?Weeks in Treatment: 4 ?HBO Safety Checklist Items ?Safety Checklist ?Consent Form Signed ?Patient voided  / foley secured and emptied ?When did you last eato 0715 ?  Last dose of injectable or oral agent 0710 ?Ostomy pouch emptied and vented if applicable ?NA ?All implantable devices assessed, documented and approved ?NA ?Intravenous access site secured and place ?Valuables secured ?Linens and cotton and cotton/polyester blend (less than 51% polyester) ?Personal oil-based products / skin lotions / body lotions removed ?Wigs or hairpieces removed ?NA ?Smoking or tobacco materials removed ?Books / newspapers / magazines / loose paper removed ?Cologne, aftershave, perfume and deodorant removed ?Jewelry removed (may wrap wedding band) ?NA ?Make-up removed ?NA ?Hair care products removed ?Battery operated devices (external) removed ?Heating patches and chemical warmers removed ?Titanium eyewear removed ?NA ?Nail polish cured greater than 10 hours ?NA ?Casting material cured greater than 10 hours ?NA ?Hearing aids removed ?NA ?Loose dentures or partials removed ?NA ?Prosthetics have been removed ?NA ?Patient demonstrates correct use of air break device (if applicable) ?Patient concerns have been addressed ?Patient grounding bracelet on and cord attached to chamber ?Specifics for Inpatients (complete in addition to above) ?Medication sheet sent with patient ?NA ?Intravenous medications needed or due during therapy sent with patient ?NA ?Drainage tubes (e.g. nasogastric tube or chest tube secured and vented) ?NA ?Endotracheal or Tracheotomy tube secured ?NA ?Cuff deflated of air and inflated with saline ?NA ?Airway suctioned ?NA ?Notes ?Paper version used prior to treatment. The checklist was done before treatment started. ?Electronic Signature(s) ?Signed: 09/28/2021 1:40:20 PM By: [name] EMT ?Entered By: [name] on 09/28/2021 13:40:20 ?

## 2021-09-28 NOTE — Progress Notes (Addendum)
ABED, SCHAR A. ([id number]) ?Visit Report for 09/28/2021 ?Arrival Information Details ?Patient Name: Date of Service: ?[name], [name] 09/28/2021 10:00 A M ?Medical Record Number: [id number] ?Patient Account Number: [id number] ?Date of Birth/Sex: Treating RN: ?12/11/1955 (66 y.o. [name] ?Primary Care Ensley Blas: [name] ?Other Clinician: [name] ?Referring Trayvion Embleton: ?Treating Birdell Frasier/Extender: [name] ?[name] ?Weeks in Treatment: 4 ?Visit Information History Since Last Visit ?All ordered tests and consults were completed: Yes ?Patient Arrived: Knee Scooter ?Added or deleted any medications: No ?Arrival Time: 09:24 ?Any new allergies or adverse reactions: No ?Accompanied By: Wife ?Had a fall or experienced change in No ?Transfer Assistance: None ?activities of daily living that may affect ?Patient Identification Verified: Yes ?risk of falls: ?Secondary Verification Process Completed: Yes ?Signs or symptoms of abuse/neglect since last visito No ?Patient Requires Transmission-Based Precautions: No ?Hospitalized since last visit: No ?Patient Has Alerts: Yes ?Implantable device outside of the clinic excluding No ?Patient Alerts: Patient on Blood Thinner cellular tissue based products placed in the center ?since last visit: ?Pain Present Now: No ?Notes ?Paper version used prior to treatment. ?Electronic Signature(s) ?Signed: 09/28/2021 2:28:03 PM By: [name] EMT ?Previous Signature: 09/28/2021 1:32:53 PM Version By: [name] EMT ?Previous Signature: 09/28/2021 1:29:53 PM Version By: [name] EMT ?Entered By: [name] on 09/28/2021 14:28:02 ?-------------------------------------------------------------------------------- ?Encounter Discharge Information Details ?Patient Name: Date of Service: ?[name], [name] 09/28/2021 10:00 A M ?Medical Record Number: [id number] ?Patient Account Number: [id number] ?Date of Birth/Sex: Treating RN: ?November 12, 1955 (66 y.o. [name] ?Primary Care Quindon Denker: [name] ?Other Clinician: [name] ?Referring Kemp Gomes: ?Treating Neng Albee/Extender: [name] ?[name] ?Weeks in Treatment: 4 ?Encounter Discharge Information Items ?Discharge Condition: Stable ?Ambulatory Status: Knee Scooter ?Discharge Destination: Home ?Transportation: Private Auto ?Accompanied By: Wife ?Schedule Follow-up Appointment: Yes ?Clinical Summary of Care: ?Electronic Signature(s) ?Signed: 09/28/2021 2:29:30 PM By: [name] EMT ?Previous Signature: 09/28/2021 2:02:58 PM Version By: [name] EMT ?Entered By: [name] on 09/28/2021 14:29:29 ?-------------------------------------------------------------------------------- ?Vitals Details ?Patient Name: ?Date of Service: ?[name], [name] 09/28/2021 10:00 A M ?Medical Record Number: [id number] ?Patient Account Number: [id number] ?Date of Birth/Sex: ?Treating RN: ?[birth year] (66 y.o. [name] ?Primary Care Caci Orren: [name] ?Other Clinician: [name] ?Referring Aivan Fillingim: ?Treating Matin Mattioli/Extender: [name] ?[name] ?Weeks in Treatment: 4 ?Vital Signs ?Time Taken: 10:01 ?Temperature (??F): 97.5 ?Height (in): 74 ?Pulse (bpm): 73 ?Weight (lbs): 186 ?Respiratory Rate (breaths/min): 16 ?Body Mass Index (BMI): 23.9 ?Blood Pressure (mmHg): 129/84 ?Capillary Blood Glucose (mg/dl): 562 ?Reference Range: 80 - 120 mg / dl ?Notes ?The patient was given 8oz, Clucerna CBG rechecked 30 min after. ?Electronic Signature(s) ?Signed: 09/28/2021 1:37:49 PM By: [name] EMT ?Entered By: [name] on 09/28/2021 13:37:48 ?

## 2021-09-28 NOTE — Progress Notes (Signed)
FLORENTINO, LAABS A. ([id number]) ?Visit Report for 09/28/2021 ?Problem List Details ?Patient Name: Date of Service: ?[name], [name] 09/28/2021 10:00 A M ?Medical Record Number: [id number] ?Patient Account Number: [id number] ?Date of Birth/Sex: Treating RN: ?08/02/1955 (66 y.o. 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 08/25/2021 No Yes ?E11.621 Type 2 diabetes mellitus with foot ulcer 08/25/2021 No Yes ?I25.10 Atherosclerotic heart disease of native coronary artery without angina pectoris 08/25/2021 No Yes ?K74.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 09/28/2021 ?Medical Record Number: [id number] ?Patient Account Number: [id number] ?Date of Birth/Sex: Treating RN: ?12/29/1955 (66 y.o. 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Modifier: ?Quantity: 4 ?Physician Procedures ?: CPT4 Code Description Modifier [id number] [phone] - WC PHYS HYPERBARIC OXYGEN THERAPY ICD-10 Diagnosis Description E11.621 Type 2 diabetes mellitus with foot ulcer 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

## 2021-09-29 ENCOUNTER — Telehealth (INDEPENDENT_AMBULATORY_CARE_PROVIDER_SITE_OTHER): Payer: BC Managed Care – PPO | Admitting: Infectious Disease

## 2021-09-29 ENCOUNTER — Other Ambulatory Visit: Payer: Self-pay

## 2021-09-29 ENCOUNTER — Encounter (HOSPITAL_BASED_OUTPATIENT_CLINIC_OR_DEPARTMENT_OTHER): Payer: BC Managed Care – PPO | Admitting: General Surgery

## 2021-09-29 ENCOUNTER — Encounter: Payer: Self-pay | Admitting: Infectious Disease

## 2021-09-29 DIAGNOSIS — I739 Peripheral vascular disease, unspecified: Secondary | ICD-10-CM

## 2021-09-29 DIAGNOSIS — I2510 Atherosclerotic heart disease of native coronary artery without angina pectoris: Secondary | ICD-10-CM | POA: Diagnosis not present

## 2021-09-29 DIAGNOSIS — Z792 Long term (current) use of antibiotics: Secondary | ICD-10-CM | POA: Diagnosis not present

## 2021-09-29 DIAGNOSIS — Z7984 Long term (current) use of oral hypoglycemic drugs: Secondary | ICD-10-CM | POA: Diagnosis not present

## 2021-09-29 DIAGNOSIS — E785 Hyperlipidemia, unspecified: Secondary | ICD-10-CM | POA: Diagnosis not present

## 2021-09-29 DIAGNOSIS — I252 Old myocardial infarction: Secondary | ICD-10-CM | POA: Diagnosis not present

## 2021-09-29 DIAGNOSIS — M86472 Chronic osteomyelitis with draining sinus, left ankle and foot: Secondary | ICD-10-CM | POA: Diagnosis not present

## 2021-09-29 DIAGNOSIS — E1140 Type 2 diabetes mellitus with diabetic neuropathy, unspecified: Secondary | ICD-10-CM | POA: Diagnosis not present

## 2021-09-29 DIAGNOSIS — E11610 Type 2 diabetes mellitus with diabetic neuropathic arthropathy: Secondary | ICD-10-CM | POA: Diagnosis not present

## 2021-09-29 DIAGNOSIS — I10 Essential (primary) hypertension: Secondary | ICD-10-CM | POA: Diagnosis not present

## 2021-09-29 DIAGNOSIS — Z7982 Long term (current) use of aspirin: Secondary | ICD-10-CM | POA: Diagnosis not present

## 2021-09-29 DIAGNOSIS — E08621 Diabetes mellitus due to underlying condition with foot ulcer: Secondary | ICD-10-CM

## 2021-09-29 DIAGNOSIS — Z794 Long term (current) use of insulin: Secondary | ICD-10-CM | POA: Diagnosis not present

## 2021-09-29 DIAGNOSIS — Z452 Encounter for adjustment and management of vascular access device: Secondary | ICD-10-CM | POA: Diagnosis not present

## 2021-09-29 DIAGNOSIS — E11621 Type 2 diabetes mellitus with foot ulcer: Secondary | ICD-10-CM

## 2021-09-29 DIAGNOSIS — E1151 Type 2 diabetes mellitus with diabetic peripheral angiopathy without gangrene: Secondary | ICD-10-CM

## 2021-09-29 DIAGNOSIS — L97403 Non-pressure chronic ulcer of unspecified heel and midfoot with necrosis of muscle: Secondary | ICD-10-CM

## 2021-09-29 DIAGNOSIS — E1169 Type 2 diabetes mellitus with other specified complication: Secondary | ICD-10-CM | POA: Diagnosis not present

## 2021-09-29 DIAGNOSIS — K219 Gastro-esophageal reflux disease without esophagitis: Secondary | ICD-10-CM | POA: Diagnosis not present

## 2021-09-29 DIAGNOSIS — T8131XA Disruption of external operation (surgical) wound, not elsewhere classified, initial encounter: Secondary | ICD-10-CM | POA: Diagnosis not present

## 2021-09-29 DIAGNOSIS — M1990 Unspecified osteoarthritis, unspecified site: Secondary | ICD-10-CM | POA: Diagnosis not present

## 2021-09-29 DIAGNOSIS — L97522 Non-pressure chronic ulcer of other part of left foot with fat layer exposed: Secondary | ICD-10-CM | POA: Diagnosis not present

## 2021-09-29 DIAGNOSIS — D649 Anemia, unspecified: Secondary | ICD-10-CM | POA: Diagnosis not present

## 2021-09-29 DIAGNOSIS — Z951 Presence of aortocoronary bypass graft: Secondary | ICD-10-CM

## 2021-09-29 MED ORDER — AMOXICILLIN-POT CLAVULANATE 875-125 MG PO TABS: 1.0000 | ORAL_TABLET | Freq: Two times a day (BID) | ORAL | 2 refills | Status: DC

## 2021-09-29 MED ORDER — DOXYCYCLINE HYCLATE 100 MG PO TABS: 100.0000 mg | ORAL_TABLET | Freq: Two times a day (BID) | ORAL | 2 refills | Status: DC

## 2021-09-29 NOTE — Progress Notes (Signed)
Virtual Visit via Video Note ? ?I connected with [name] on 09/29/21 at  3:00 PM EDT by a video enabled telemedicine application and verified that I am speaking with the correct person using two identifiers. ? ?Location: ?Patient: Home ?Provider: [facility] ?  ?I discussed the limitations of evaluation and management by telemedicine and the availability of in person appointments. The patient expressed understanding and agreed to proceed. ? ?History of Present Illness: ? ?[name] is a 66 year old Caucasian man with a history of diabetes with neuropathy who has undergone multiple surgeries to his foot where he has had chronic osteomyelitis most recent debridements performed on 07/28/2021 with debridement and irrigation of wound down to including bone with application of a skin graft substitute and a wound vacuum. Cultures from the operating room yielded diphtheroids. ? ?MRI of the foot performed in October had previously shown early osteomyelitis involving the medial navicular bone ? ?Patient was seen by my partner Dr. Earlene Plater and set up with a PICC line and IV therapy with daptomycin and cefepime to cover him for other pathogens than just the diphtheroids that were isolated. ? ?Patient has completed daptomycin and still has a few doses of cefepime remaining he has been going to the wound care and undergoing hyperbaric oxygen treatment. ? ?Apparently the wound on the outside has been healing well and doing the best it has been for a long time per the patient and his wife.  His inflammatory markers have not really budged much since he has been on antibiotics. ? ?He does not have much pain in the foot but he did have much sensation either. ? ?He has been counseled previously that to get a cure here he would need a likely below the knee amputation though he is been against this unless it is of the last resort. ? ?12 point review of systems as above otherwise negative ? ? ? ?Past Medical History:  ?Diagnosis Date  ?  Coronary artery disease   ? Diabetes mellitus without complication (HCC)   ? Type II  ? GERD (gastroesophageal reflux disease)   ? Hypertension   ? Myocardial infarction [facility])   ? "Silent"  ? Neuropathy   ? ? ?Past Surgical History:  ?Procedure Laterality Date  ? APPLICATION OF WOUND VAC Left 03/19/2021  ? Procedure: APPLICATION OF WOUND VAC;  Surgeon: Park Liter, DPM;  Location: [facility];  Service: Podiatry;  Laterality: Left;  ? APPLICATION OF WOUND VAC Left 07/28/2021  ? Procedure: APPLICATION OF WOUND VAC;  Surgeon: Park Liter, DPM;  Location: [facility];  Service: Podiatry;  Laterality: Left;  ? BONE BIOPSY Left 05/19/2021  ? Procedure: BONE BIOPSY;  Surgeon: Park Liter, DPM;  Location: [facility];  Service: Podiatry;  Laterality: Left;  ? CATARACT EXTRACTION Bilateral   ? COLONOSCOPY W/ POLYPECTOMY    ? CORONARY ARTERY BYPASS GRAFT N/A 12/26/2017  ? Procedure: CORONARY ARTERY BYPASS GRAFTING (CABG) x 3 Using Left Internal Mammary Artery (LIMA) and Edoscopically Harvested Left Leg Greater Saphenous Vein Graft (SVG); -LIMA to LAD, -SVG to LEFT CIRCUMFLEX, -SVG to PDA,;  Surgeon: Kerin Perna, MD;  Location: [facility] OR;  Service: Open Heart Surgery;  Laterality: N/A;  ? ENDOVEIN HARVEST OF GREATER SAPHENOUS VEIN Left 12/26/2017  ? Procedure: ENDOVEIN HARVEST OF GREATER SAPHENOUS VEIN: Left Thigh to below the knee;  Surgeon: Kerin Perna, MD;  Location: [facility] OR;  Service: Open Heart Surgery;  Laterality: Left;  ? INTRAVASCULAR PRESSURE WIRE/FFR STUDY N/A 12/19/2017  ?  Procedure: INTRAVASCULAR PRESSURE WIRE/FFR STUDY;  Surgeon: Lyn RecordsSmith, Henry W, MD;  [facility];  Service: Cardiovascular;  Laterality: N/A;  ? LEFT HEART CATH AND CORONARY ANGIOGRAPHY N/A 12/19/2017  ? Procedure: LEFT HEART CATH AND CORONARY ANGIOGRAPHY;  Surgeon: Lyn RecordsSmith, Henry W, MD;  [facility];  Service: Cardiovascular;  Laterality: N/A;  ? TEE WITHOUT CARDIOVERSION N/A 12/26/2017  ? Procedure: TRANSESOPHAGEAL  ECHOCARDIOGRAM (TEE);  Surgeon: Donata ClayVan Trigt, Theron AristaPeter, MD;  [facility];  Service: Open Heart Surgery;  Laterality: N/A;  ? VASECTOMY    ? WOUND DEBRIDEMENT Left 03/16/2021  ? Procedure: DEBRIDEMENT LEFT FOOT WOUND;  Surgeon: Park LiterPrice, Michael J, DPM;  Location: [facility];  Service: Podiatry;  Laterality: Left;  ? WOUND DEBRIDEMENT Left 03/19/2021  ? Procedure: DEBRIDEMENT WOUND;  Surgeon: Park LiterPrice, Michael J, DPM;  Location: [facility];  Service: Podiatry;  Laterality: Left;  ? WOUND DEBRIDEMENT Left 04/13/2021  ? Procedure: DEBRIDEMENT WOUND;  Surgeon: Park LiterPrice, Michael J, DPM;  Location: [facility];  Service: Podiatry;  Laterality: Left;  Leave patient on stretcher  ? WOUND DEBRIDEMENT Left 05/19/2021  ? Procedure: DEBRIDEMENT WOUND;  Surgeon: Park LiterPrice, Michael J, DPM;  Location: [facility];  Service: Podiatry;  Laterality: Left;  ? WOUND DEBRIDEMENT Left 07/06/2021  ? Procedure: DEBRIDEMENT WOUND;  Surgeon: Park LiterPrice, Michael J, DPM;  Location: [facility];  Service: Podiatry;  Laterality: Left;  ? WOUND DEBRIDEMENT Left 07/28/2021  ? Procedure: DEBRIDEMENT WOUND AND APPLICATION OF SKIN GRAFT SUBSTITUTE;  Surgeon: Park LiterPrice, Michael J, DPM;  Location: [facility];  Service: Podiatry;  Laterality: Left;  ? ? ?Family History  ?Problem Relation Age of Onset  ? Diabetes Mother   ? Diabetes Father   ? Heart disease Father   ? Diabetes Daughter   ? ? ?  ?Social History  ? ?Socioeconomic History  ? Marital status: Married  ?  Spouse name: Not on file  ? Number of children: Not on file  ? Years of education: Not on file  ? Highest education level: Not on file  ?Occupational History  ? Not on file  ?Tobacco Use  ? Smoking status: Never  ? Smokeless tobacco: Never  ?Vaping Use  ? Vaping Use: Never used  ?Substance and Sexual Activity  ? Alcohol use: Never  ? Drug use: Never  ? Sexual activity: Not on file  ?Other Topics Concern  ? Not on file  ?Social History Narrative  ? Not on file  ? ?Social Determinants of Health  ? ?Financial Resource Strain: Not on file  ?Food  Insecurity: Not on file  ?Transportation Needs: Not on file  ?Physical Activity: Not on file  ?Stress: Not on file  ?Social Connections: Not on file  ? ? ?Allergies  ?Allergen Reactions  ? Codeine Sulfate [Codeine] Nausea Only  ? ? ? ?Current Outpatient Medications:  ?  acetaminophen (TYLENOL) 500 MG tablet, Take 2 tablets (1,000 mg total) by mouth every 6 (six) hours as needed for mild pain or fever., Disp: 30 tablet, Rfl: 0 ?  amoxicillin-clavulanate (AUGMENTIN) 875-125 MG tablet, Take 1 tablet by mouth 2 (two) times daily., Disp: 60 tablet, Rfl: 2 ?  aspirin EC 81 MG tablet, Take 81 mg by mouth daily., Disp: , Rfl:  ?  atorvastatin (LIPITOR) 80 MG tablet, Take 80 mg by mouth daily., Disp: , Rfl:  ?  calcium carbonate (TUMS - DOSED IN MG ELEMENTAL CALCIUM) 500 MG chewable tablet, Chew 1-2 tablets by mouth daily as needed for indigestion or  heartburn., Disp: , Rfl:  ?  doxycycline (VIBRA-TABS) 100 MG tablet, Take 1 tablet (100 mg total) by mouth 2 (two) times daily., Disp: 60 tablet, Rfl: 2 ?  ibuprofen (ADVIL) 200 MG tablet, Take 400 mg by mouth every 8 (eight) hours as needed (pain.)., Disp: , Rfl:  ?  LANTUS 100 UNIT/ML injection, Inject 30 Units into the skin 2 (two) times daily., Disp: , Rfl:  ?  lisinopril (ZESTRIL) 10 MG tablet, Take 10 mg by mouth in the morning., Disp: , Rfl:  ?  metFORMIN (GLUCOPHAGE) 850 MG tablet, Take 850 mg by mouth in the morning, at noon, and at bedtime., Disp: , Rfl:  ?  metoprolol succinate (TOPROL-XL) 50 MG 24 hr tablet, Take 50 mg by mouth daily. Take with or immediately following a meal., Disp: , Rfl:  ?  sitaGLIPtin (JANUVIA) 100 MG tablet, Take 100 mg by mouth every morning. , Disp: , Rfl:  ?  Tetrahydrozoline HCl (VISINE OP), Place 1 drop into both eyes daily as needed (redness)., Disp: , Rfl:  ? ?  ?Observations/Objective: ? ?Milt appeared to be doing well and was in no acute distress on her video feed but I was not able to see his foot on the feed. ? ? ? ?Assessment and  Plan: ? ?Diabetic foot ulcer with osteomyelitis involving heel bones: ? ?This is clearly an infection that will ultimately need a below the knee amputation to cure it unless she somehow "beats the odds" ? ?We will have hi

## 2021-09-30 ENCOUNTER — Encounter (HOSPITAL_BASED_OUTPATIENT_CLINIC_OR_DEPARTMENT_OTHER): Payer: BC Managed Care – PPO | Admitting: General Surgery

## 2021-09-30 ENCOUNTER — Telehealth: Payer: Self-pay

## 2021-09-30 DIAGNOSIS — E1140 Type 2 diabetes mellitus with diabetic neuropathy, unspecified: Secondary | ICD-10-CM | POA: Diagnosis not present

## 2021-09-30 DIAGNOSIS — E11610 Type 2 diabetes mellitus with diabetic neuropathic arthropathy: Secondary | ICD-10-CM | POA: Diagnosis not present

## 2021-09-30 DIAGNOSIS — L97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: Secondary | ICD-10-CM | POA: Diagnosis not present

## 2021-09-30 DIAGNOSIS — I2510 Atherosclerotic heart disease of native coronary artery without angina pectoris: Secondary | ICD-10-CM | POA: Diagnosis not present

## 2021-09-30 DIAGNOSIS — E11621 Type 2 diabetes mellitus with foot ulcer: Secondary | ICD-10-CM | POA: Diagnosis not present

## 2021-09-30 DIAGNOSIS — T8131XA Disruption of external operation (surgical) wound, not elsewhere classified, initial encounter: Secondary | ICD-10-CM | POA: Diagnosis not present

## 2021-09-30 DIAGNOSIS — Z951 Presence of aortocoronary bypass graft: Secondary | ICD-10-CM | POA: Diagnosis not present

## 2021-09-30 DIAGNOSIS — M86672 Other chronic osteomyelitis, left ankle and foot: Secondary | ICD-10-CM | POA: Diagnosis not present

## 2021-09-30 LAB — GLUCOSE, CAPILLARY
Glucose-Capillary: 154 mg/dL — ABNORMAL HIGH (ref 70–99)
Glucose-Capillary: 118 mg/dL — ABNORMAL HIGH (ref 70–99)

## 2021-09-30 NOTE — Progress Notes (Addendum)
CURLIE, MACKEN A. ([id number]) ?Visit Report for 09/30/2021 ?Arrival Information Details ?Patient Name: Date of Service: ?[name], [name] 09/30/2021 10:00 A M ?Medical Record Number: [id number] ?Patient Account Number: [id number] ?Date of Birth/Sex: Treating RN: ?1955-09-24 (66 y.o. [name] ?Primary Care Tyniah Kastens: [name] ?Other Clinician: [name] ?Referring Njeri Vicente: ?Treating Janelli Welling/Extender: [name] ?[name] ?Weeks in Treatment: 5 ?Visit Information History Since Last Visit ?All ordered tests and consults were completed: Yes ?Patient Arrived: Knee Scooter ?Added or deleted any medications: No ?Arrival Time: 09:26 ?Any new allergies or adverse reactions: No ?Accompanied By: Wife ?Had a fall or experienced change in No ?Transfer Assistance: None ?activities of daily living that may affect ?Patient Identification Verified: Yes ?risk of falls: ?Secondary Verification Process Completed: Yes ?Signs or symptoms of abuse/neglect since last visito No ?Patient Requires Transmission-Based Precautions: No ?Hospitalized since last visit: No ?Patient Has Alerts: Yes ?Implantable device outside of the clinic excluding No ?Patient Alerts: Patient on Blood Thinner cellular tissue based products placed in the center ?since last visit: ?Pain Present Now: No ?Notes ?Paper version used prior to treatment. ?Electronic Signature(s) ?Signed: 09/30/2021 11:49:46 AM By: [name] EMT ?Entered By: [name] on 09/30/2021 11:49:45 ?-------------------------------------------------------------------------------- ?Encounter Discharge Information Details ?Patient Name: Date of Service: ?[name], [name] 09/30/2021 10:00 A M ?Medical Record Number: [id number] ?Patient Account Number: [id number] ?Date of Birth/Sex: Treating RN: ?1956/04/19 (66 y.o. [name] ?Primary Care Rhiannan Kievit: [name] ?Other Clinician: [name] ?Referring Shelvia Fojtik: ?Treating Dhruti Ghuman/Extender: [name] ?[name] ?Weeks in Treatment: 5 ?Encounter Discharge Information Items ?Discharge Condition: Stable ?Ambulatory Status: Knee Scooter ?Discharge Destination: Home ?Transportation: Private Auto ?Accompanied By: None ?Schedule Follow-up Appointment: Yes ?Clinical Summary of Care: ?Electronic Signature(s) ?Signed: 09/30/2021 12:47:39 PM By: [name] EMT ?Entered By: [name] on 09/30/2021 12:47:38 ?-------------------------------------------------------------------------------- ?Vitals Details ?Patient Name: ?Date of Service: ?[name], [name] 09/30/2021 10:00 A M ?Medical Record Number: [id number] ?Patient Account Number: [id number] ?Date of Birth/Sex: ?Treating RN: ?[birth year] (66 y.o. [name] ?Primary Care Kamoria Lucien: [name] ?Other Clinician: [name] ?Referring Kataleya Zaugg: ?Treating Amand Lemoine/Extender: [name] ?[name] ?Weeks in Treatment: 5 ?Vital Signs ?Time Taken: 09:47 ?Temperature (??F): 97.5 ?Height (in): 74 ?Pulse (bpm): 83 ?Weight (lbs): 186 ?Respiratory Rate (breaths/min): 16 ?Body Mass Index (BMI): 23.9 ?Blood Pressure (mmHg): 130/80 ?Capillary Blood Glucose (mg/dl): 629 ?Reference Range: 80 - 120 mg / dl ?Notes ?Paper version used prior to treatment. ?Electronic Signature(s) ?Signed: 09/30/2021 11:50:59 AM By: [name] EMT ?Entered By: [name] on 09/30/2021 11:50:59 ?

## 2021-09-30 NOTE — Progress Notes (Signed)
[name], [name] ([id number]) ?Visit Report for 09/30/2021 ?Problem List Details ?Patient Name: Date of Service: ?[name], [name] 09/30/2021 10:00 A M ?Medical Record Number: [id number] ?Patient Account Number: [id number] ?Date of Birth/Sex: Treating RN: ?16-Aug-1955 (66 y.o. 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 08/25/2021 No Yes ?E11.621 Type 2 diabetes mellitus with foot ulcer 08/25/2021 No Yes ?I25.10 Atherosclerotic heart disease of native coronary artery without angina pectoris 08/25/2021 No Yes ?U20.254 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 09/30/2021 ?Medical Record Number: [id number] ?Patient Account Number: [id number] ?Date of Birth/Sex: Treating RN: ?1956-06-04 (66 y.o. 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Modifier: ?Quantity: 4 ?Physician Procedures ?: CPT4 Code Description Modifier [id number] - WC PHYS HYPERBARIC OXYGEN THERAPY ICD-10 Diagnosis Description E11.621 Type 2 diabetes mellitus with foot ulcer 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

## 2021-09-30 NOTE — Progress Notes (Addendum)
JEWELL, RYANS A. ([id number]) ?Visit Report for 09/30/2021 ?HBO Details ?Patient Name: Date of Service: ?NA RRO Colin, MILTO Colin A. 09/30/2021 10:00 A M ?Medical Record Number: [id number] ?Patient Account Number: [id number] ?Date of Birth/Sex: Treating RN: ?[birth year] (66 y.o. [name] ?Primary Care Marlea Gambill: [name] ?Other Clinician: [name] ?Referring Keni Wafer: ?Treating Adrianna Dudas/Extender: [name] ?[name] ?Weeks in Treatment: 5 ?HBO Treatment Course Details ?Treatment Course Number: 1 ?Ordering Kiylah Loyer: [name] ?T Treatments Ordered: ?otal 40 HBO Treatment Start Date: 09/20/2021 ?HBO Indication: ?Diabetic Ulcer(s) of the Lower Extremity ?Standard/Conservative Wound Care tried and failed greater than or equal to ?30 days ?Wound #1 Left, Medial Foot ?HBO Treatment Details ?Treatment Number: 8 ?Patient Type: Outpatient ?Chamber Type: Monoplace ?Chamber Serial #: [id number] ?Treatment Protocol: 2.5 ATA with 90 minutes oxygen, with two 5 minute air breaks ?Treatment Details ?Compression Rate Down: 2.0 psi / minute ?De-Compression Rate Up: 2.0 psi / minute ?A breaks and breathing ?ir ?Compress Tx Pressure periods Decompress Decompress ?Begins Reached (leave unused spaces Begins Ends ?blank) ?Chamber Pressure (ATA 1 2.5 2.5 2.5 2.5 2.5 - - 2.5 1 ?) ?Clock Time (24 hr) 09:54 10:05 10:36 10:41 11:11 11:16 - - 11:46 11:59 ?Treatment Length: 125 (minutes) ?Treatment Segments: 4 ?Vital Signs ?Capillary Blood Glucose Reference Range: 80 - 120 mg / dl ?HBO Diabetic Blood Glucose Intervention Range: <131 mg/dl or >629 mg/dl ?Time Vitals Blood Respiratory Capillary Blood Glucose Pulse Action ?Type: ?Pulse: Temperature: ?Taken: ?Pressure: ?Rate: ?Glucose (mg/dl): ?Meter #: Oximetry (%) Taken: ?Pre 09:47 130/80 83 16 97.5 154 ?Post 12:04 129/79 77 14 97.5 118 ?Treatment Response ?Treatment Toleration: Well ?Treatment Completion Status: Treatment Completed without Adverse Event ?Treatment Notes ?The patient  was asked before his treatment if he had any problems today with his sinus or ear. He stated no. His treatment went well, with no problems today. ?Decompression rate at 2.0 PSI/min. After his treatment, I asked him about his ears and sinus. He stated that he was OK. ?Additional Procedure Documentation ?Tissue Sevierity: Fat layer exposed ?Physician HBO Attestation: ?I certify that I supervised this HBO treatment in accordance with Medicare ?guidelines. A trained emergency response team is readily available per Yes ?hospital policies and procedures. ?Continue HBOT as ordered. Yes ?Electronic Signature(s) ?Signed: 09/30/2021 3:53:45 PM By: [name] MD FACS ?Previous Signature: 09/30/2021 12:18:21 PM Version By: [name] EMT ?Previous Signature: 09/30/2021 12:09:57 PM Version By: [name] EMT ?Previous Signature: 09/30/2021 12:09:57 PM Version By: [name] EMT ?Entered By: [name] on 09/30/2021 15:53:45 ?-------------------------------------------------------------------------------- ?HBO Safety Checklist Details ?Patient Name: ?Date of Service: ?NA RRO Colin, MILTO Colin A. 09/30/2021 10:00 A M ?Medical Record Number: [id number] ?Patient Account Number: [id number] ?Date of Birth/Sex: ?Treating RN: ?07-10-1955 (66 y.o. [name] ?Primary Care Larell Baney: [name] ?Other Clinician: [name] ?Referring Shon Mansouri: ?Treating Della Scrivener/Extender: [name] ?[name] ?Weeks in Treatment: 5 ?HBO Safety Checklist Items ?Safety Checklist ?Consent Form Signed ?Patient voided / foley secured and emptied ?When did you last eato 0745 ?Last dose of injectable or oral agent 0745 ?Ostomy pouch emptied and vented if applicable ?NA ?All implantable devices assessed, documented and approved ?NA ?Intravenous access site secured and place ?Valuables secured ?Linens and cotton and cotton/polyester blend (less than 51% polyester) ?Personal oil-based products / skin lotions / body lotions removed ?Wigs  or hairpieces removed ?NA ?Smoking or tobacco materials removed ?Books / newspapers / magazines / loose paper removed ?Cologne, aftershave, perfume and deodorant removed ?Jewelry removed (may wrap wedding band) ?NA ?Make-up removed ?  NA ?Hair care products removed ?Battery operated devices (external) removed ?Heating patches and chemical warmers removed ?Titanium eyewear removed ?NA ?Nail polish cured greater than 10 hours ?NA ?Casting material cured greater than 10 hours ?NA ?Hearing aids removed ?NA ?Loose dentures or partials removed ?NA ?Prosthetics have been removed ?NA ?Patient demonstrates correct use of air break device (if applicable) ?Patient concerns have been addressed ?Patient grounding bracelet on and cord attached to chamber ?Specifics for Inpatients (complete in addition to above) ?Medication sheet sent with patient ?NA ?Intravenous medications needed or due during therapy sent with patient ?NA ?Drainage tubes (e.g. nasogastric tube or chest tube secured and vented) ?NA ?Endotracheal or Tracheotomy tube secured ?NA ?Cuff deflated of air and inflated with saline ?NA ?Airway suctioned ?NA ?Notes ?Paper version used prior to treatment. The checklist was done before treatment started. ?Electronic Signature(s) ?Signed: 09/30/2021 11:52:23 AM By: [name] EMT ?Entered By: [name] on 09/30/2021 11:52:22 ?

## 2021-09-30 NOTE — Telephone Encounter (Signed)
I spoke to [name] with [facility] and verbal orders given.  Patient should  get last dose of IV antibiotics today and picc can be removed after the last dose per Dr Theodoro Clock verbalized understanding ?

## 2021-10-01 ENCOUNTER — Encounter (HOSPITAL_BASED_OUTPATIENT_CLINIC_OR_DEPARTMENT_OTHER): Payer: BC Managed Care – PPO | Admitting: General Surgery

## 2021-10-01 DIAGNOSIS — T8131XA Disruption of external operation (surgical) wound, not elsewhere classified, initial encounter: Secondary | ICD-10-CM | POA: Diagnosis not present

## 2021-10-01 DIAGNOSIS — Z7982 Long term (current) use of aspirin: Secondary | ICD-10-CM | POA: Diagnosis not present

## 2021-10-01 DIAGNOSIS — Z452 Encounter for adjustment and management of vascular access device: Secondary | ICD-10-CM | POA: Diagnosis not present

## 2021-10-01 DIAGNOSIS — M86672 Other chronic osteomyelitis, left ankle and foot: Secondary | ICD-10-CM | POA: Diagnosis not present

## 2021-10-01 DIAGNOSIS — Z792 Long term (current) use of antibiotics: Secondary | ICD-10-CM | POA: Diagnosis not present

## 2021-10-01 DIAGNOSIS — E11621 Type 2 diabetes mellitus with foot ulcer: Secondary | ICD-10-CM | POA: Diagnosis not present

## 2021-10-01 DIAGNOSIS — M1990 Unspecified osteoarthritis, unspecified site: Secondary | ICD-10-CM | POA: Diagnosis not present

## 2021-10-01 DIAGNOSIS — E11610 Type 2 diabetes mellitus with diabetic neuropathic arthropathy: Secondary | ICD-10-CM | POA: Diagnosis not present

## 2021-10-01 DIAGNOSIS — E785 Hyperlipidemia, unspecified: Secondary | ICD-10-CM | POA: Diagnosis not present

## 2021-10-01 DIAGNOSIS — K219 Gastro-esophageal reflux disease without esophagitis: Secondary | ICD-10-CM | POA: Diagnosis not present

## 2021-10-01 DIAGNOSIS — L97522 Non-pressure chronic ulcer of other part of left foot with fat layer exposed: Secondary | ICD-10-CM | POA: Diagnosis not present

## 2021-10-01 DIAGNOSIS — Z951 Presence of aortocoronary bypass graft: Secondary | ICD-10-CM | POA: Diagnosis not present

## 2021-10-01 DIAGNOSIS — I252 Old myocardial infarction: Secondary | ICD-10-CM | POA: Diagnosis not present

## 2021-10-01 DIAGNOSIS — L97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: Secondary | ICD-10-CM | POA: Diagnosis not present

## 2021-10-01 DIAGNOSIS — D649 Anemia, unspecified: Secondary | ICD-10-CM | POA: Diagnosis not present

## 2021-10-01 DIAGNOSIS — I2510 Atherosclerotic heart disease of native coronary artery without angina pectoris: Secondary | ICD-10-CM | POA: Diagnosis not present

## 2021-10-01 DIAGNOSIS — E1140 Type 2 diabetes mellitus with diabetic neuropathy, unspecified: Secondary | ICD-10-CM | POA: Diagnosis not present

## 2021-10-01 DIAGNOSIS — I10 Essential (primary) hypertension: Secondary | ICD-10-CM | POA: Diagnosis not present

## 2021-10-01 DIAGNOSIS — Z794 Long term (current) use of insulin: Secondary | ICD-10-CM | POA: Diagnosis not present

## 2021-10-01 DIAGNOSIS — Z7984 Long term (current) use of oral hypoglycemic drugs: Secondary | ICD-10-CM | POA: Diagnosis not present

## 2021-10-01 LAB — GLUCOSE, CAPILLARY
Glucose-Capillary: 200 mg/dL — ABNORMAL HIGH (ref 70–99)
Glucose-Capillary: 165 mg/dL — ABNORMAL HIGH (ref 70–99)

## 2021-10-01 NOTE — Progress Notes (Signed)
KIERE, EASTERWOOD A. ([id number]) ?Visit Report for 10/01/2021 ?Chief Complaint Document Details ?Patient Name: Date of Service: ?Colin [name] 10/01/2021 12:30 PM ?Medical Record Number: [id number] ?Patient Account Number: [id number] ?Date of Birth/Sex: Treating RN: ?[birth year] (66 y.o. [name] ?Primary Care Provider: [name] Other Clinician: ?Referring Provider: ?Treating Provider/Extender: [name] ?[name] ?Weeks in Treatment: 5 ?Information Obtained from: Patient ?Chief Complaint ?Patients presents for treatment of an open diabetic ulcer with exposed bone and osteomyelitis ?Electronic Signature(s) ?Signed: 10/01/2021 1:27:40 PM By: [name] MD FACS ?Entered By: [name] on 10/01/2021 13:27:40 ?-------------------------------------------------------------------------------- ?Debridement Details ?Patient Name: Date of Service: ?Colin [name] 10/01/2021 12:30 PM ?Medical Record Number: [id number] ?Patient Account Number: [id number] ?Date of Birth/Sex: Treating RN: ?1956/03/02 (66 y.o. M) [name] ?Primary Care Provider: [name] Other Clinician: ?Referring Provider: ?Treating Provider/Extender: [name] ?[name] ?Weeks in Treatment: 5 ?Debridement Performed for Assessment: Wound #1 Left,Medial Foot ?Performed By: Physician [name], MD ?Debridement Type: Debridement ?Severity of Tissue Pre Debridement: Bone involvement without necrosis ?Level of Consciousness (Pre-procedure): Awake and Alert ?Pre-procedure Verification/Time Out Yes - 13:05 ?Taken: ?Start Time: 13:06 ?Pain Control: Lidocaine 5% topical ointment ?T Area Debrided (L x W): ?otal 3 (cm) x 4 (cm) = 12 (cm?) ?Tissue and other material debrided: Viable, Non-Viable, Bone, Slough, Subcutaneous, Fibrin/Exudate, Slough ?Level: Skin/Subcutaneous Tissue/Muscle/Bone ?Debridement Description: Excisional ?Instrument: Curette ?Bleeding: Minimum ?Hemostasis Achieved: Pressure ?End Time:  13:12 ?Procedural Pain: 0 ?Post Procedural Pain: 0 ?Response to Treatment: Procedure was tolerated well ?Level of Consciousness (Post- Awake and Alert ?procedure): ?Post Debridement Measurements of Total Wound ?Length: (cm) 5.9 ?Width: (cm) 4.5 ?Depth: (cm) 0.9 ?Volume: (cm?) 18.767 ?Character of Wound/Ulcer Post Debridement: Improved ?Severity of Tissue Post Debridement: Bone involvement without necrosis ?Post Procedure Diagnosis ?Same as Pre-procedure ?Electronic Signature(s) ?Signed: 10/01/2021 3:45:50 PM By: Deon Pilling RN, BSN ?Signed: 10/01/2021 4:58:30 PM By: [name] MD FACS ?Entered By: Deon Pilling on 10/01/2021 13:13:18 ?-------------------------------------------------------------------------------- ?HPI Details ?Patient Name: Date of Service: ?Colin [name] 10/01/2021 12:30 PM ?Medical Record Number: [id number] ?Patient Account Number: [id number] ?Date of Birth/Sex: Treating RN: ?1956/03/25 (66 y.o. [name] ?Primary Care Provider: [name] Other Clinician: ?Referring Provider: ?Treating Provider/Extender: [name] ?[name] ?Weeks in Treatment: 5 ?History of Present Illness ?HPI Description: ADMISSION ?08/25/2021 ?This is a 66 year old man who initially presented to his primary care provider in September 2022 with pain in his left foot. He was sent for an x-ray and while ?the x-ray was being performed, the tech pointed out a wound on his foot that the patient was not aware existed. He does have type 2 diabetes with significant ?neuropathy. His diabetes is suboptimally controlled with his most recent A1c being 8.5. He also has a history of coronary artery disease status post three- ?vessel CABG. he was initially seen by orthopedics, but they referred him to [facility]. He has undergone at least 7 ?operations/debridements and several applications of skin substitute under the care of podiatry. He has been in a wound VAC for much of this time. His  most ?recent procedure was July 28, 2021. A portion of the talus was biopsied and was found to be consistent with osteomyelitis. Culture also returned positive ?for corynebacterium. He was seen on August 16, 2021 by infectious disease. A PICC line has been placed and he will be receiving a 6-week course of IV ?daptomycin and cefepime. In October 2022, he underwent lower extremity vascular  studies. Results are copied here: ?Right: Resting right ankle-brachial index is within normal range. No ?evidence of significant right lower extremity arterial disease. The right ?toe-brachial index is abnormal. ?Left: Resting left ankle-brachial index indicates mild left lower ?extremity arterial disease. The left toe-brachial index is abnormal. ?He has not been seen by vascular surgery despite these findings. ?He presented to clinic today in a cam boot and is using a knee scooter to offload. Wound VAC was in place. Once this was removed, a large ulcer was ?identified on the left midfoot/ankle. Bone is frankly exposed. There is no malodorous or purulent drainage. There is some granulation tissue over the central ?portion of the exposed bone. There is a tunnel that extends posteriorly for roughly 10 cm. ?It has been discussed with him by multiple providers that he is at very high risk of losing his lower leg because of this wound. He is extremely eager to avoid ?this outcome and is here today to review his options as well as receive ongoing wound care. ?09/03/2021: Here for reevaluation of his wound. There does not appear to have been any substantial improvement overall since our last visit. He has been in a ?wound VAC with white foam overlying the exposed bone. We are working on getting him approved for hyperbaric oxygen therapy. ?09/10/2021: We are in the process of getting him cleared to begin hyperbaric oxygen therapy. He still needs to obtain a chest x-ray. Although the wound ?measurements are roughly the same, I think the  overall appearance of the wound is better. The exposed bone has a bit more granulation tissue covering it. He ?has not received a vascular surgery appointment to reevaluate his flow to the wound. ?09/17/2021: He has been approved for hyperbaric oxygen therapy and completed his chest x-ray, which I reviewed and it appears normal. The tunnels at the 12 ?and 10:00 positions are smaller. There is more granulation tissue covering the exposed bone and the undermining has decreased. He still has not received a ?vascular surgery appointment. ?09/24/2021: He initiated hyperbaric oxygen therapy this week and is tolerating it well. He has an appointment with vascular surgery coming up on May 16. The ?granulation tissue is covering more of the exposed bone and both tunnels are a bit smaller. ?10/01/2021: He continues to tolerate hyperbaric oxygen therapy. He saw infectious disease and they are planning to pull his PICC line. He has been initiated on ?oral antibiotics (doxycycline and Augmentin). The wound looks about the same but the tunnels are a little bit smaller. The skin seems to be contracting ?somewhat around the exposed bone. ?Electronic Signature(s) ?Signed: 10/01/2021 1:28:51 PM By: [name] MD FACS ?Entered By: [name] on 10/01/2021 13:28:51 ?-------------------------------------------------------------------------------- ?Physical Exam Details ?Patient Name: Date of Service: ?Colin [name] 10/01/2021 12:30 PM ?Medical Record Number: [id number] ?Patient Account Number: [id number] ?Date of Birth/Sex: Treating RN: ?1955/11/30 (66 y.o. [name] ?Primary Care Provider: [name] Other Clinician: ?Referring Provider: ?Treating Provider/Extender: [name] ?[name] ?Weeks in Treatment: 5 ?Constitutional ?. . . . No acute distress. ?Respiratory ?Normal work of breathing on room air. ?Notes ?10/01/2021: The exposed bone is now basically covered with granulation tissue. The tunnels at 12 and  10:00 continue to contract. He continues to accumulate ?some slough around the edges of the wound. ?Electronic Signature(s) ?Signed: 10/01/2021 1:30:22 PM By: [name] MD FACS ?Entered By: [name] on 04/21/202

## 2021-10-01 NOTE — Progress Notes (Addendum)
[name], Colin A. ([id number]) ?Visit Report for 10/01/2021 ?HBO Details ?Patient Name: Date of Service: ?[name], [name] A. 10/01/2021 10:00 A M ?Medical Record Number: [id number] ?Patient Account Number: [id number] ?Date of Birth/Sex: Treating RN: ?December 01, 1955 (66 y.o. M) Elesa Hacker, [name] ?Primary Care Ormond Lazo: [name] ?Other Clinician: [name] ?Referring Jamian Andujo: ?Treating Dontrel Smethers/Extender: [name] ?[name] ?Weeks in Treatment: 5 ?HBO Treatment Course Details ?Treatment Course Number: 1 ?Ordering Galileo Colello: [name] ?T Treatments Ordered: ?otal 40 HBO Treatment Start Date: 09/20/2021 ?HBO Indication: ?Diabetic Ulcer(s) of the Lower Extremity ?Standard/Conservative Wound Care tried and failed greater than or equal to ?30 days ?Wound #1 Left, Medial Foot ?HBO Treatment Details ?Treatment Number: 9 ?Patient Type: Outpatient ?Chamber Type: Monoplace ?Chamber Serial #: [id number] ?Treatment Protocol: 2.5 ATA with 90 minutes oxygen, with two 5 minute air breaks ?Treatment Details ?Compression Rate Down: 2.0 psi / minute ?De-Compression Rate Up: 2.0 psi / minute ?A breaks and breathing ?ir ?Compress Tx Pressure periods Decompress Decompress ?Begins Reached (leave unused spaces Begins Ends ?blank) ?Chamber Pressure (ATA 1 2.5 2.5 2.5 2.5 2.5 - - 2.5 1 ?) ?Clock Time (24 hr) 09:45 09:56 10:26 10:32 11:02 11:07 - - 11:37 11:50 ?Treatment Length: 125 (minutes) ?Treatment Segments: 4 ?Vital Signs ?Capillary Blood Glucose Reference Range: 80 - 120 mg / dl ?HBO Diabetic Blood Glucose Intervention Range: <131 mg/dl or >850 mg/dl ?Time Vitals Blood Respiratory Capillary Blood Glucose Pulse Action ?Type: ?Pulse: Temperature: ?Taken: ?Pressure: ?Rate: ?Glucose (mg/dl): ?Meter #: Oximetry (%) Taken: ?Pre 09:40 141/79 82 14 98.5 200 ?Post 11:57 149/91 72 16 97.8 165 ?Treatment Response ?Treatment Toleration: Well ?Treatment Completion Status: Treatment Completed without Adverse Event ?Treatment Notes ?The patient  was asked before his treatment if he had any problems today with his sinus or ear. He stated no. His treatment went well, with no problems today. ?After his treatment, I asked him about his ears and sinus. He stated that he was OK. ?Additional Procedure Documentation ?Tissue Sevierity: Necrosis of bone ?Physician HBO Attestation: ?I certify that I supervised this HBO treatment in accordance with Medicare ?guidelines. A trained emergency response team is readily available per Yes ?hospital policies and procedures. ?Continue HBOT as ordered. Yes ?Electronic Signature(s) ?Signed: 10/01/2021 4:52:03 PM By: [name] MD FACS ?Previous Signature: 10/01/2021 12:36:38 PM Version By: [name] EMT ?Entered By: [name] on 10/01/2021 16:52:03 ?-------------------------------------------------------------------------------- ?HBO Safety Checklist Details ?Patient Name: ?Date of Service: ?[name], [name] A. 10/01/2021 10:00 A M ?Medical Record Number: [id number] ?Patient Account Number: [id number] ?Date of Birth/Sex: ?Treating RN: ?Feb 15, 1956 (66 y.o. M) Elesa Hacker, [name] ?Primary Care Jonie Burdell: [name] ?Other Clinician: [name] ?Referring Neythan Kozlov: ?Treating Ashten Prats/Extender: [name] ?[name] ?Weeks in Treatment: 5 ?HBO Safety Checklist Items ?Safety Checklist ?Consent Form Signed ?Patient voided / foley secured and emptied ?When did you last eato 0745 ?Last dose of injectable or oral agent 0745 ?Ostomy pouch emptied and vented if applicable ?Colin ?All implantable devices assessed, documented and approved ?Colin ?Intravenous access site secured and place ?Valuables secured ?Linens and cotton and cotton/polyester blend (less than 51% polyester) ?Personal oil-based products / skin lotions / body lotions removed ?Wigs or hairpieces removed ?Colin ?Smoking or tobacco materials removed ?Books / newspapers / magazines / loose paper removed ?Cologne, aftershave, perfume and deodorant removed ?Jewelry removed  (may wrap wedding band) ?Colin ?Make-up removed ?Colin ?Hair care products removed ?Battery operated devices (external) removed ?Heating patches and chemical warmers removed ?Titanium eyewear removed ?Colin ?Nail polish cured greater than  10 hours ?Colin ?Casting material cured greater than 10 hours ?Colin ?Hearing aids removed ?Colin ?Loose dentures or partials removed ?Colin ?Prosthetics have been removed ?Colin ?Patient demonstrates correct use of air break device (if applicable) ?Patient concerns have been addressed ?Patient grounding bracelet on and cord attached to chamber ?Specifics for Inpatients (complete in addition to above) ?Medication sheet sent with patient ?Colin ?Intravenous medications needed or due during therapy sent with patient ?Colin ?Drainage tubes (e.g. nasogastric tube or chest tube secured and vented) ?Colin ?Endotracheal or Tracheotomy tube secured ?Colin ?Cuff deflated of air and inflated with saline ?Colin ?Airway suctioned ?Colin ?Notes ?Paper version used prior to treatment. The checklist was done before treatment started. ?Electronic Signature(s) ?Signed: 10/01/2021 12:32:41 PM By: [name] EMT ?Entered By: [name] on 10/01/2021 12:32:41 ?

## 2021-10-01 NOTE — Progress Notes (Signed)
MERCY, Colin A. ([id number]) ?Visit Report for 10/01/2021 ?Arrival Information Details ?Patient Name: Date of Service: ?NA Colin Nephew A. 10/01/2021 12:30 PM ?Medical Record Number: [id number] ?Patient Account Number: [id number] ?Date of Birth/Sex: Treating RN: ?[birth year] (66 y.o. M) Elesa Hacker, [name] ?Primary Care Ashutosh Dieguez: [name] Other Clinician: ?Referring Add Dinapoli: ?Treating Alyzae Hawkey/Extender: [name] ?[name] ?Weeks in Treatment: 5 ?Visit Information History Since Last Visit ?Added or deleted any medications: No ?Patient Arrived: Knee Scooter ?Any new allergies or adverse reactions: No ?Arrival Time: 12:53 ?Had a fall or experienced change in No ?Accompanied By: self ?activities of daily living that may affect ?Transfer Assistance: None ?risk of falls: ?Patient Identification Verified: Yes ?Signs or symptoms of abuse/neglect since last No ?Secondary Verification Process Completed: Yes visito ?Patient Requires Transmission-Based Precautions: No ?Hospitalized since last visit: No ?Patient Has Alerts: Yes ?Implantable device outside of the clinic No ?Patient Alerts: Patient on Blood Thinner excluding ?cellular tissue based products placed in the ?center ?since last visit: ?Has Dressing in Place as Prescribed: Yes ?Has Footwear/Offloading in Place as Yes ?Prescribed: ?Left: Removable Cast Walker/Walking Boot ?Pain Present Now: No ?Electronic Signature(s) ?Signed: 10/01/2021 3:45:50 PM By: Shawn Stall RN, BSN ?Entered By: Shawn Stall on 10/01/2021 12:54:12 ?-------------------------------------------------------------------------------- ?Encounter Discharge Information Details ?Patient Name: Date of Service: ?NA Colin Nephew A. 10/01/2021 12:30 PM ?Medical Record Number: [id number] ?Patient Account Number: [id number] ?Date of Birth/Sex: Treating RN: ?[birth year] (66 y.o. M) Elesa Hacker, [name] ?Primary Care Geniyah Eischeid: [name] Other Clinician: ?Referring Kerria Sapien: ?Treating Caira Poche/Extender: [name] ?[name] ?Weeks in Treatment: 5 ?Encounter Discharge Information Items Post Procedure Vitals ?Discharge Condition: Stable ?Temperature (F): 98.5 ?Ambulatory Status: Knee Scooter ?Pulse (bpm): 82 ?Discharge Destination: Home ?Respiratory Rate (breaths/min): 14 ?Transportation: Private Auto ?Blood Pressure (mmHg): 141/79 ?Accompanied By: self ?Schedule Follow-up Appointment: Yes ?Clinical Summary of Care: ?Electronic Signature(s) ?Signed: 10/01/2021 3:45:50 PM By: Shawn Stall RN, BSN ?Entered By: Shawn Stall on 10/01/2021 13:18:24 ?-------------------------------------------------------------------------------- ?Lower Extremity Assessment Details ?Patient Name: ?Date of Service: ?NA Colin Nephew A. 10/01/2021 12:30 PM ?Medical Record Number: [id number] ?Patient Account Number: [id number] ?Date of Birth/Sex: ?Treating RN: ?09-29-1955 (66 y.o. M) Elesa Hacker, [name] ?Primary Care Elham Fini: [name] ?Other Clinician: ?Referring Augie Vane: ?Treating Deonne Rooks/Extender: [name] ?[name] ?Weeks in Treatment: 5 ?Edema Assessment ?Assessed: [Left: Yes] [Right: No] ?Edema: [Left: Ye] [Right: s] ?Calf ?Left: Right: ?Point of Measurement: From Medial Instep 36 cm ?Ankle ?Left: Right: ?Point of Measurement: From Medial Instep 23 cm ?Vascular Assessment ?Pulses: ?Dorsalis Pedis ?Palpable: [Left:Yes] ?Electronic Signature(s) ?Signed: 10/01/2021 3:45:50 PM By: Shawn Stall RN, BSN ?Entered By: Shawn Stall on 10/01/2021 12:54:56 ?-------------------------------------------------------------------------------- ?Multi Wound Chart Details ?Patient Name: ?Date of Service: ?NA Colin Nephew A. 10/01/2021 12:30 PM ?Medical Record Number: [id number] ?Patient Account Number: [id number] ?Date of Birth/Sex: ?Treating RN: ?Jan 19, 1956 (66 y.o. [name] ?Primary Care Winnie Umali: [name] ?Other Clinician: ?Referring Issabelle Mcraney: ?Treating Kyoko Elsea/Extender: [name] ?[name] ?Weeks in Treatment: 5 ?Vital  Signs ?Height(in): 74 ?Capillary Blood Glucose(mg/dl): 546 ?Weight(lbs): 186 ?Pulse(bpm): 82 ?Body Mass Index(BMI): 23.9 ?Blood Pressure(mmHg): 141/79 ?Temperature(??F): 98.5 ?Respiratory Rate(breaths/min): 14 ?Photos: [1:Left, Medial Foot] [N/A:N/A N/A N/A] ?Wound Location: [1:Gradually Appeared] [N/A:N/A N/A] ?Wounding Event: [1:Diabetic Wound/Ulcer of the Lower] [N/A:N/A N/A] ?Primary Etiology: [1:Extremity Cataracts, Coronary Artery Disease, N/A] [N/A:N/A] ?Comorbid History: [1:Hypertension, Myocardial Infarction, Peripheral Arterial Disease, Type II Diabetes, Osteomyelitis, Neuropathy 02/22/2021] [N/A:N/A N/A] ?Date Acquired: [1:5] [N/A:N/A N/A] ?Weeks of Treatment: [1:Open] [N/A:N/A N/A] ?Wound Status: [1:No] [N/A:N/A N/A] ?Wound Recurrence: [1:5.9x4.5x0.9] [N/A:N/A N/A] ?Measurements L  x W x D (cm) [1:20.852] [N/A:N/A N/A] ?A (cm?) : ?rea [1:18.767] [N/A:N/A N/A] ?Volume (cm?) : [1:-47.50%] [N/A:N/A N/A] ?% Reduction in A [1:rea: -32.80%] [N/A:N/A N/A] ?% Reduction in Volume: [1:12] ?Position 1 (o'clock): [1:3.5] ?Maximum Distance 1 (cm): [1:12] ?Starting Position 1 (o'clock): [1:2] ?Ending Position 1 (o'clock): [1:2.5] ?Maximum Distance 1 (cm): [1:Yes] [N/A:N/A N/A] ?Tunneling: [1:Yes] [N/A:N/A N/A] ?Undermining: [1:Grade 3] [N/A:N/A N/A] ?Classification: [1:Medium] [N/A:N/A N/A] ?Exudate A mount: [1:Serosanguineous] [N/A:N/A N/A] ?Exudate Type: [1:red, brown] [N/A:N/A N/A] ?Exudate Color: [1:Distinct, outline attached] [N/A:N/A N/A] ?Wound Margin: [1:Large (67-100%)] [N/A:N/A N/A] ?Granulation A mount: [1:Red, Pink, Hyper-granulation] [N/A:N/A N/A] ?Granulation Quality: [1:Small (1-33%)] [N/A:N/A N/A] ?Necrotic A mount: ?[1:Fat Layer (Subcutaneous Tissue): Yes N/A] [N/A:N/A] ?Exposed Structures: ?[1:Bone: Yes Fascia: No Tendon: No Muscle: No Joint: No None] [N/A:N/A N/A] ?Epithelialization: [1:Debridement - Excisional] [N/A:N/A N/A] ?Debridement: ?Pre-procedure Verification/Time Out 13:05 [N/A:N/A  N/A] ?Taken: [1:Lidocaine 5% topical ointment] [N/A:N/A N/A] ?Pain Control: [1:Bone, Subcutaneous, Slough] [N/A:N/A N/A] ?Tissue Debrided: [1:Skin/Subcutaneous] [N/A:N/A N/A] ?Level: [1:Tissue/Muscle/Bone 12] [N/A:N/A N/A] ?Debridement A (sq cm): [1:rea Curette] [N/A:N/A N/A] ?Instrument: [1:Minimum] [N/A:N/A N/A] ?Bleeding: [1:Pressure] [N/A:N/A N/A] ?Hemostasis Achieved: [1:0] [N/A:N/A N/A] ?Procedural Pain: [1:0] [N/A:N/A N/A] ?Post Procedural Pain: ?Debridement Treatment Response: Procedure was tolerated well [N/A:N/A N/A] ?Post Debridement Measurements L x 5.9x4.5x0.9 [N/A:N/A N/A] ?W x D (cm) [1:18.767] [N/A:N/A N/A] ?Post Debridement Volume: (cm?) [1:Debridement] [N/A:N/A N/A] ?Procedures Performed: [1:Negative Pressure Wound Therapy Maintenance (NPWT)] ?Treatment Notes ?Wound #1 (Foot) Wound Laterality: Left, Medial ?Cleanser ?Peri-Wound Care ?Topical ?Primary Dressing ?Secondary Dressing ?Secured With ?Compression Wrap ?Compression Stockings ?Add-Ons ?Notes ?prisma, white and black foam KCI negative pressure . ?Electronic Signature(s) ?Signed: 10/01/2021 1:26:46 PM By: [name] MD FACS ?Signed: 10/01/2021 6:10:38 PM By: Zandra Abts RN, BSN ?Entered By: [name] on 10/01/2021 13:26:46 ?-------------------------------------------------------------------------------- ?Multi-Disciplinary Care Plan Details ?Patient Name: ?Date of Service: ?NA Colin Nephew A. 10/01/2021 12:30 PM ?Medical Record Number: [id number] ?Patient Account Number: [id number] ?Date of Birth/Sex: ?Treating RN: ?Mar 02, 1956 (66 y.o. M) Elesa Hacker, [name] ?Primary Care Sara Keys: [name] ?Other Clinician: ?Referring Ahja Martello: ?Treating Iverson Sees/Extender: [name] ?[name] ?Weeks in Treatment: 5 ?Multidisciplinary Care Plan reviewed with physician ?Active Inactive ?HBO ?Nursing Diagnoses: ?Anxiety related to feelings of confinement associated with the hyperbaric oxygen chamber ?Anxiety related to knowledge deficit  of hyperbaric oxygen therapy and treatment procedures ?Discomfort related to temperature and humidity changes inside hyperbaric chamber ?Potential for barotraumas to ears, sinuses, teeth, and lungs or cerebral gas embolism rel

## 2021-10-01 NOTE — Progress Notes (Signed)
[name], Colin A. ([id number]) ?Visit Report for 10/01/2021 ?Problem List Details ?Patient Name: Date of Service: ?[name], [name] A. 10/01/2021 10:00 A M ?Medical Record Number: [id number] ?Patient Account Number: [id number] ?Date of Birth/Sex: Treating RN: ?[name] (66 y.o. 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 08/25/2021 No Yes ?E11.621 Type 2 diabetes mellitus with foot ulcer 08/25/2021 No Yes ?I25.10 Atherosclerotic heart disease of native coronary artery without angina pectoris 08/25/2021 No Yes ?[id number] 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 10/01/2021 ?Medical Record Number: [id number] ?Patient Account Number: [id number] ?Date of Birth/Sex: Treating RN: ?[name], [name] (66 y.o. 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 ?Facility Procedures ?CPT4 Code: [id number] ?Description: G0277-(Facility Use Only) HBOT full body chamber, 26min , ICD-10 Diagnosis Description E11.621 Type 2 diabetes mellitus with foot ulcer 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 M86.672 Other chronic  ?osteomyelitis, left ankle and foot 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 ?Modifier: ?Quantity: 4 ?Physician Procedures ?: CPT4 Code Description Modifier [id number] - WC PHYS HYPERBARIC OXYGEN THERAPY ICD-10 Diagnosis Description 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

## 2021-10-01 NOTE — Progress Notes (Signed)
LYDEN, REDNER A. ([id number]) ?Visit Report for 10/01/2021 ?Arrival Information Details ?Patient Name: Date of Service: ?[name], [name] 10/01/2021 10:00 A M ?Medical Record Number: [id number] ?Patient Account Number: [id number] ?Date of Birth/Sex: Treating RN: ?05/11/1956 (66 y.o. [name] ?Primary Care Naman Spychalski: [name] ?Other Clinician: [name] ?Referring Tailey Top: ?Treating Bob Eastwood/Extender: [name] ?[name] ?Weeks in Treatment: 5 ?Visit Information History Since Last Visit ?All ordered tests and consults were completed: Yes ?Patient Arrived: Knee Scooter ?Added or deleted any medications: No ?Arrival Time: 09:21 ?Any new allergies or adverse reactions: No ?Accompanied By: Wife ?Had a fall or experienced change in No ?Transfer Assistance: None ?activities of daily living that may affect ?Patient Identification Verified: Yes ?risk of falls: ?Secondary Verification Process Completed: Yes ?Signs or symptoms of abuse/neglect since last visito No ?Patient Requires Transmission-Based Precautions: No ?Hospitalized since last visit: No ?Patient Has Alerts: Yes ?Implantable device outside of the clinic excluding No ?Patient Alerts: Patient on Blood Thinner cellular tissue based products placed in the center ?since last visit: ?Pain Present Now: No ?Notes ?Paper version used prior to treatment. ?Electronic Signature(s) ?Signed: 10/01/2021 12:29:32 PM By: [name] EMT ?Entered By: [name] on 10/01/2021 12:29:32 ?-------------------------------------------------------------------------------- ?Encounter Discharge Information Details ?Patient Name: Date of Service: ?[name], [name] 10/01/2021 10:00 A M ?Medical Record Number: [id number] ?Patient Account Number: [id number] ?Date of Birth/Sex: Treating RN: ?[birth year] (66 y.o. M) Elesa Hacker, [name] ?Primary Care Kent Riendeau: [name] ?Other Clinician: [name] ?Referring Ishmail Mcmanamon: ?Treating Iver Fehrenbach/Extender: [name] ?[name] ?Weeks in Treatment: 5 ?Encounter Discharge Information Items ?Discharge Condition: Stable ?Ambulatory Status: Knee Scooter ?Discharge Destination: Other (Note Required) ?Transportation: Private Auto ?Accompanied By: Wife ?Schedule Follow-up Appointment: Yes ?Clinical Summary of Care: ?Notes ?The patient has a clinic visit after his treatment today. ?Electronic Signature(s) ?Signed: 10/01/2021 12:42:42 PM By: [name] EMT ?Entered By: [name] on 10/01/2021 12:42:42 ?-------------------------------------------------------------------------------- ?Vitals Details ?Patient Name: ?Date of Service: ?[name], [name] 10/01/2021 10:00 A M ?Medical Record Number: [id number] ?Patient Account Number: [id number] ?Date of Birth/Sex: ?Treating RN: ?02-13-1956 (66 y.o. M) Elesa Hacker, [name] ?Primary Care Ottie Tillery: [name] ?Other Clinician: [name] ?Referring Envy Meno: ?Treating Janille Draughon/Extender: [name] ?[name] ?Weeks in Treatment: 5 ?Vital Signs ?Time Taken: 09:40 ?Temperature (??F): 98.5 ?Height (in): 74 ?Pulse (bpm): 82 ?Weight (lbs): 186 ?Respiratory Rate (breaths/min): 14 ?Body Mass Index (BMI): 23.9 ?Blood Pressure (mmHg): 141/79 ?Capillary Blood Glucose (mg/dl): 696 ?Reference Range: 80 - 120 mg / dl ?Electronic Signature(s) ?Signed: 10/01/2021 12:31:25 PM By: [name] EMT ?Entered By: [name] on 10/01/2021 12:31:24 ?

## 2021-10-02 DIAGNOSIS — T8131XA Disruption of external operation (surgical) wound, not elsewhere classified, initial encounter: Secondary | ICD-10-CM | POA: Diagnosis not present

## 2021-10-03 DIAGNOSIS — T8131XA Disruption of external operation (surgical) wound, not elsewhere classified, initial encounter: Secondary | ICD-10-CM | POA: Diagnosis not present

## 2021-10-04 ENCOUNTER — Encounter (HOSPITAL_BASED_OUTPATIENT_CLINIC_OR_DEPARTMENT_OTHER): Payer: BC Managed Care – PPO | Admitting: General Surgery

## 2021-10-04 DIAGNOSIS — Z7982 Long term (current) use of aspirin: Secondary | ICD-10-CM | POA: Diagnosis not present

## 2021-10-04 DIAGNOSIS — Z7984 Long term (current) use of oral hypoglycemic drugs: Secondary | ICD-10-CM | POA: Diagnosis not present

## 2021-10-04 DIAGNOSIS — K219 Gastro-esophageal reflux disease without esophagitis: Secondary | ICD-10-CM | POA: Diagnosis not present

## 2021-10-04 DIAGNOSIS — E1140 Type 2 diabetes mellitus with diabetic neuropathy, unspecified: Secondary | ICD-10-CM | POA: Diagnosis not present

## 2021-10-04 DIAGNOSIS — E11621 Type 2 diabetes mellitus with foot ulcer: Secondary | ICD-10-CM | POA: Diagnosis not present

## 2021-10-04 DIAGNOSIS — L97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: Secondary | ICD-10-CM | POA: Diagnosis not present

## 2021-10-04 DIAGNOSIS — E785 Hyperlipidemia, unspecified: Secondary | ICD-10-CM | POA: Diagnosis not present

## 2021-10-04 DIAGNOSIS — Z792 Long term (current) use of antibiotics: Secondary | ICD-10-CM | POA: Diagnosis not present

## 2021-10-04 DIAGNOSIS — Z452 Encounter for adjustment and management of vascular access device: Secondary | ICD-10-CM | POA: Diagnosis not present

## 2021-10-04 DIAGNOSIS — E11610 Type 2 diabetes mellitus with diabetic neuropathic arthropathy: Secondary | ICD-10-CM | POA: Diagnosis not present

## 2021-10-04 DIAGNOSIS — L97522 Non-pressure chronic ulcer of other part of left foot with fat layer exposed: Secondary | ICD-10-CM | POA: Diagnosis not present

## 2021-10-04 DIAGNOSIS — D649 Anemia, unspecified: Secondary | ICD-10-CM | POA: Diagnosis not present

## 2021-10-04 DIAGNOSIS — I2510 Atherosclerotic heart disease of native coronary artery without angina pectoris: Secondary | ICD-10-CM | POA: Diagnosis not present

## 2021-10-04 DIAGNOSIS — M86672 Other chronic osteomyelitis, left ankle and foot: Secondary | ICD-10-CM | POA: Diagnosis not present

## 2021-10-04 DIAGNOSIS — I252 Old myocardial infarction: Secondary | ICD-10-CM | POA: Diagnosis not present

## 2021-10-04 DIAGNOSIS — I10 Essential (primary) hypertension: Secondary | ICD-10-CM | POA: Diagnosis not present

## 2021-10-04 DIAGNOSIS — Z794 Long term (current) use of insulin: Secondary | ICD-10-CM | POA: Diagnosis not present

## 2021-10-04 DIAGNOSIS — Z951 Presence of aortocoronary bypass graft: Secondary | ICD-10-CM | POA: Diagnosis not present

## 2021-10-04 DIAGNOSIS — T8131XA Disruption of external operation (surgical) wound, not elsewhere classified, initial encounter: Secondary | ICD-10-CM | POA: Diagnosis not present

## 2021-10-04 DIAGNOSIS — M1990 Unspecified osteoarthritis, unspecified site: Secondary | ICD-10-CM | POA: Diagnosis not present

## 2021-10-04 LAB — GLUCOSE, CAPILLARY
Glucose-Capillary: 150 mg/dL — ABNORMAL HIGH (ref 70–99)
Glucose-Capillary: 118 mg/dL — ABNORMAL HIGH (ref 70–99)

## 2021-10-04 NOTE — Progress Notes (Addendum)
TAEDYN, GLASSCOCK A. ([id number]) ?Visit Report for 10/04/2021 ?HBO Details ?Patient Name: Date of Service: ?Colin RRO N, [name] 10/04/2021 10:00 A M ?Medical Record Number: [id number] ?Patient Account Number: [id number] ?Date of Birth/Sex: Treating RN: ?[birth year] (66 y.o. [name] ?Primary Care Miller Edgington: [name] ?Other Clinician: [name] ?Referring Rylen Swindler: ?Treating Morghan Kester/Extender: [name] ?[name] ?Weeks in Treatment: 5 ?HBO Treatment Course Details ?Treatment Course Number: 1 ?Ordering Antwione Picotte: [name] ?T Treatments Ordered: ?otal 40 HBO Treatment Start Date: 09/20/2021 ?HBO Indication: ?Diabetic Ulcer(s) of the Lower Extremity ?Standard/Conservative Wound Care tried and failed greater than or equal to ?30 days ?Wound #1 Left, Medial Foot ?HBO Treatment Details ?Treatment Number: 10 ?Patient Type: Outpatient ?Chamber Type: Monoplace ?Chamber Serial #: [id number] ?Treatment Protocol: 2.5 ATA with 90 minutes oxygen, with two 5 minute air breaks ?Treatment Details ?Compression Rate Down: 2.0 psi / minute ?De-Compression Rate Up: 2.0 psi / minute ?A breaks and breathing ?ir ?Compress Tx Pressure periods Decompress Decompress ?Begins Reached (leave unused spaces Begins Ends ?blank) ?Chamber Pressure (ATA 1 2.5 2.5 2.5 2.5 2.5 - - 2.5 1 ?) ?Clock Time (24 hr) 09:55 10:07 10:38 10:43 11:13 11:18 - - 11:48 12:01 ?Treatment Length: 126 (minutes) ?Treatment Segments: 4 ?Vital Signs ?Capillary Blood Glucose Reference Range: 80 - 120 mg / dl ?HBO Diabetic Blood Glucose Intervention Range: <131 mg/dl or >431 mg/dl ?Time Vitals Blood Respiratory Capillary Blood Glucose Pulse Action ?Type: ?Pulse: Temperature: ?Taken: ?Pressure: ?Rate: ?Glucose (mg/dl): ?Meter #: Oximetry (%) Taken: ?Pre 09:50 129/89 76 14 97.6 150 ?Post 12:09 125/90 71 14 97.6 118 ?Treatment Response ?Treatment Toleration: Well ?Treatment Completion Status: Treatment Completed without Adverse Event ?Treatment Notes ?The  patient was asked before his treatment if he had any problems today with his sinus or ear. He stated no. The patient stated that his pic line was removed ?Friday 10/01/2021. ?His treatment went well, with no problems today. ?After his treatment, I asked him about his ears and sinus. He stated that he was OK. He stated that he was going to get some lunch after leaving today. ?Additional Procedure Documentation ?Tissue Sevierity: Necrosis of bone ?Physician HBO Attestation: ?I certify that I supervised this HBO treatment in accordance with Medicare ?guidelines. A trained emergency response team is readily available per Yes ?hospital policies and procedures. ?Continue HBOT as ordered. Yes ?Electronic Signature(s) ?Signed: 10/04/2021 3:55:41 PM By: [name] MD FACS ?Signed: 10/04/2021 3:55:41 PM By: [name] MD FACS ?Previous Signature: 10/04/2021 2:32:11 PM Version By: [name] EMT ?Entered By: [name] on 10/04/2021 15:55:40 ?-------------------------------------------------------------------------------- ?HBO Safety Checklist Details ?Patient Name: ?Date of Service: ?Colin RRO N, [name] 10/04/2021 10:00 A M ?Medical Record Number: [id number] ?Patient Account Number: [id number] ?Date of Birth/Sex: ?Treating RN: ?[birth year] (66 y.o. [name] ?Primary Care Lakindra Wible: [name] ?Other Clinician: [name] ?Referring Brentlee Delage: ?Treating Marleni Gallardo/Extender: [name] ?[name] ?Weeks in Treatment: 5 ?HBO Safety Checklist Items ?Safety Checklist ?Consent Form Signed ?Patient voided / foley secured and emptied ?When did you last eato 0735 ?Last dose of injectable or oral agent 0715 ?Ostomy pouch emptied and vented if applicable ?Colin ?All implantable devices assessed, documented and approved ?Colin ?Intravenous access site secured and place ?Colin PIC line right arm D/C on 10/01/2021 ?Valuables secured ?Linens and cotton and cotton/polyester blend (less than 51% polyester) ?Personal  oil-based products / skin lotions / body lotions removed ?Wigs or hairpieces removed ?Colin ?Smoking or tobacco materials removed ?Books / newspapers / magazines / loose paper  removed ?Cologne, aftershave, perfume and deodorant removed ?Jewelry removed (may wrap wedding band) ?Colin ?Make-up removed ?Colin ?Hair care products removed ?Battery operated devices (external) removed ?Heating patches and chemical warmers removed ?Titanium eyewear removed ?Colin ?Nail polish cured greater than 10 hours ?Colin ?Casting material cured greater than 10 hours ?Colin ?Hearing aids removed ?Colin ?Loose dentures or partials removed ?Colin ?Prosthetics have been removed ?Colin ?Patient demonstrates correct use of air break device (if applicable) ?Patient concerns have been addressed ?Patient grounding bracelet on and cord attached to chamber ?Specifics for Inpatients (complete in addition to above) ?Medication sheet sent with patient ?Colin ?Intravenous medications needed or due during therapy sent with patient ?Colin ?Drainage tubes (e.g. nasogastric tube or chest tube secured and vented) ?Colin ?Endotracheal or Tracheotomy tube secured ?Colin ?Cuff deflated of air and inflated with saline ?Colin ?Airway suctioned ?Colin ?Notes ?Paper version used prior to treatment. The checklist was done before treatment started. ?Electronic Signature(s) ?Signed: 10/04/2021 2:26:25 PM By: [name] EMT ?Entered By: [name] on 10/04/2021 14:26:24 ?

## 2021-10-04 NOTE — Progress Notes (Signed)
JOSHAWA, DUBIN A. ([id number]) ?Visit Report for 10/04/2021 ?Problem List Details ?Patient Name: Date of Service: ?[name], [name] 10/04/2021 10:00 A M ?Medical Record Number: [id number] ?Patient Account Number: [id number] ?Date of Birth/Sex: Treating RN: ?1955-09-27 (66 y.o. 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 08/25/2021 No Yes ?E11.621 Type 2 diabetes mellitus with foot ulcer 08/25/2021 No Yes ?I25.10 Atherosclerotic heart disease of native coronary artery without angina pectoris 08/25/2021 No Yes ?U23.536 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 10/04/2021 ?Medical Record Number: [id number] ?Patient Account Number: [id number] ?Date of Birth/Sex: Treating RN: ?07/31/1955 (66 y.o. 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Modifier: ?Quantity: 4 ?Physician Procedures ?: CPT4 Code Description Modifier [id number] [phone] - WC PHYS HYPERBARIC OXYGEN THERAPY ICD-10 Diagnosis Description E11.621 Type 2 diabetes mellitus with foot ulcer 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

## 2021-10-04 NOTE — Progress Notes (Signed)
[name], Colin A. ([id number]) ?Visit Report for 10/04/2021 ?Arrival Information Details ?Patient Name: Date of Service: ?[name], Colin A. 10/04/2021 10:00 A M ?Medical Record Number: [id number] ?Patient Account Number: [id number] ?Date of Birth/Sex: Treating RN: ?January 26, 1956 (66 y.o. [name] ?Primary Care Shantavia Jha: [name] ?Other Clinician: [name] ?Referring Lily Velasquez: ?Treating Derwin Reddy/Extender: [name] ?[name] ?Weeks in Treatment: 5 ?Visit Information History Since Last Visit ?All ordered tests and consults were completed: Yes ?Patient Arrived: Knee Scooter ?Added or deleted any medications: No ?Arrival Time: 09:33 ?Any new allergies or adverse reactions: No ?Accompanied By: Wife ?Had a fall or experienced change in No ?Transfer Assistance: None ?activities of daily living that may affect ?Patient Identification Verified: Yes ?risk of falls: ?Secondary Verification Process Completed: Yes ?Signs or symptoms of abuse/neglect since last visito No ?Patient Requires Transmission-Based Precautions: No ?Hospitalized since last visit: No ?Patient Has Alerts: Yes ?Implantable device outside of the clinic excluding No ?Patient Alerts: Patient on Blood Thinner cellular tissue based products placed in the center ?since last visit: ?Pain Present Now: No ?Notes ?Paper version used prior to treatment. ?Electronic Signature(s) ?Signed: 10/04/2021 2:33:58 PM By: [name] EMT ?Previous Signature: 10/04/2021 2:22:40 PM Version By: [name] EMT ?Previous Signature: 10/04/2021 2:21:17 PM Version By: [name] EMT ?Entered By: [name] on 10/04/2021 14:33:57 ?-------------------------------------------------------------------------------- ?Encounter Discharge Information Details ?Patient Name: Date of Service: ?[name], Colin A. 10/04/2021 10:00 A M ?Medical Record Number: [id number] ?Patient Account Number: [id number] ?Date of Birth/Sex: Treating RN: ?06-Jan-1956 (65 y.o. [name] ?Primary Care Duvall Comes: [name] ?Other Clinician: [name] ?Referring Jonavan Vanhorn: ?Treating Birdena Kingma/Extender: [name] ?[name] ?Weeks in Treatment: 5 ?Encounter Discharge Information Items ?Discharge Condition: Stable ?Ambulatory Status: Knee Scooter ?Discharge Destination: Home ?Transportation: Private Auto ?Accompanied By: Wife ?Schedule Follow-up Appointment: No ?Clinical Summary of Care: ?Electronic Signature(s) ?Signed: 10/04/2021 2:33:41 PM By: [name] EMT ?Entered By: [name] on 10/04/2021 14:33:41 ?-------------------------------------------------------------------------------- ?Vitals Details ?Patient Name: ?Date of Service: ?[name], [name] A. 10/04/2021 10:00 A M ?Medical Record Number: [id number] ?Patient Account Number: [id number] ?Date of Birth/Sex: ?Treating RN: ?[birth year] (66 y.o. [name] ?Primary Care Zyana Amaro: [name] ?Other Clinician: [name] ?Referring Hilery Wintle: ?Treating Neosha Switalski/Extender: [name] ?[name] ?Weeks in Treatment: 5 ?Vital Signs ?Time Taken: 09:50 ?Temperature (??F): 97.6 ?Height (in): 74 ?Pulse (bpm): 76 ?Weight (lbs): 186 ?Respiratory Rate (breaths/min): 14 ?Body Mass Index (BMI): 23.9 ?Blood Pressure (mmHg): 129/89 ?Capillary Blood Glucose (mg/dl): 150 ?Reference Range: 80 - 120 mg / dl ?Notes ?Paper version used prior to treatment. ?Electronic Signature(s) ?Signed: 10/04/2021 2:22:22 PM By: [name] EMT ?Entered By: [name] on 10/04/2021 14:22:22 ?

## 2021-10-05 ENCOUNTER — Encounter (HOSPITAL_BASED_OUTPATIENT_CLINIC_OR_DEPARTMENT_OTHER): Payer: BC Managed Care – PPO | Admitting: General Surgery

## 2021-10-05 ENCOUNTER — Ambulatory Visit: Payer: BC Managed Care – PPO | Admitting: Internal Medicine

## 2021-10-05 DIAGNOSIS — I2510 Atherosclerotic heart disease of native coronary artery without angina pectoris: Secondary | ICD-10-CM | POA: Diagnosis not present

## 2021-10-05 DIAGNOSIS — E1140 Type 2 diabetes mellitus with diabetic neuropathy, unspecified: Secondary | ICD-10-CM | POA: Diagnosis not present

## 2021-10-05 DIAGNOSIS — T8131XA Disruption of external operation (surgical) wound, not elsewhere classified, initial encounter: Secondary | ICD-10-CM | POA: Diagnosis not present

## 2021-10-05 DIAGNOSIS — L97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: Secondary | ICD-10-CM | POA: Diagnosis not present

## 2021-10-05 DIAGNOSIS — E11621 Type 2 diabetes mellitus with foot ulcer: Secondary | ICD-10-CM | POA: Diagnosis not present

## 2021-10-05 DIAGNOSIS — M86672 Other chronic osteomyelitis, left ankle and foot: Secondary | ICD-10-CM | POA: Diagnosis not present

## 2021-10-05 DIAGNOSIS — Z951 Presence of aortocoronary bypass graft: Secondary | ICD-10-CM | POA: Diagnosis not present

## 2021-10-05 DIAGNOSIS — E11610 Type 2 diabetes mellitus with diabetic neuropathic arthropathy: Secondary | ICD-10-CM | POA: Diagnosis not present

## 2021-10-05 LAB — GLUCOSE, CAPILLARY
Glucose-Capillary: 136 mg/dL — ABNORMAL HIGH (ref 70–99)
Glucose-Capillary: 120 mg/dL — ABNORMAL HIGH (ref 70–99)

## 2021-10-05 NOTE — Progress Notes (Signed)
SAJJAD, BILLETT A. ([id number]) ?Visit Report for 10/05/2021 ?Problem List Details ?Patient Name: Date of Service: ?[name], [place] A. 10/05/2021 10:00 A M ?Medical Record Number: [id number] ?Patient Account Number: [id number] ?Date of Birth/Sex: Treating RN: ?[birth year] (66 y.o. 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 08/25/2021 No Yes ?E11.621 Type 2 diabetes mellitus with foot ulcer 08/25/2021 No Yes ?I25.10 Atherosclerotic heart disease of native coronary artery without angina pectoris 08/25/2021 No Yes ?I633225 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 10/05/2021 ?Medical Record Number: [id number] ?Patient Account Number: [id number] ?Date of Birth/Sex: Treating RN: ?Jul 30, 1955 (66 y.o. 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 ?Facility Procedures ?CPT4 Code: [id number] ?Description: G0277-(Facility Use Only) HBOT full body chamber, 47min , ICD-10 Diagnosis Description E11.621 Type 2 diabetes mellitus with foot ulcer 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 M86.672 Other chronic  ?osteomyelitis, left ankle and foot 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 ?Modifier: ?Quantity: 4 ?Physician Procedures ?: CPT4 Code Description Modifier [id number] - WC PHYS HYPERBARIC OXYGEN THERAPY ICD-10 Diagnosis Description E11.621 Type 2 diabetes mellitus with foot ulcer 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

## 2021-10-05 NOTE — Progress Notes (Signed)
GEARY, RUFO A. ([id number]) ?Visit Report for 10/05/2021 ?Arrival Information Details ?Patient Name: Date of Service: ?[name], [name] 10/05/2021 10:00 A M ?Medical Record Number: [id number] ?Patient Account Number: [id number] ?Date of Birth/Sex: Treating RN: ?1956-03-06 (66 y.o. [name] ?Primary Care Amire Leazer: [name] ?Other Clinician: [name] ?Referring Karrissa Parchment: ?Treating Anglia Blakley/Extender: [name] ?[name] ?Weeks in Treatment: 5 ?Visit Information History Since Last Visit ?All ordered tests and consults were completed: Yes ?Patient Arrived: Knee Scooter ?Added or deleted any medications: No ?Arrival Time: 09:28 ?Any new allergies or adverse reactions: No ?Accompanied By: Wife ?Had a fall or experienced change in No ?Transfer Assistance: None ?activities of daily living that may affect ?Patient Identification Verified: Yes ?risk of falls: ?Secondary Verification Process Completed: Yes ?Signs or symptoms of abuse/neglect since last visito No ?Patient Requires Transmission-Based Precautions: No ?Hospitalized since last visit: No ?Patient Has Alerts: Yes ?Implantable device outside of the clinic excluding No ?Patient Alerts: Patient on Blood Thinner cellular tissue based products placed in the center ?since last visit: ?Pain Present Now: No ?Notes ?Paper version used prior to treatment. ?Electronic Signature(s) ?Signed: 10/05/2021 1:21:12 PM By: [name] EMT ?Previous Signature: 10/05/2021 10:59:00 AM Version By: [name] EMT ?Entered By: [name] on 10/05/2021 13:21:11 ?-------------------------------------------------------------------------------- ?Encounter Discharge Information Details ?Patient Name: Date of Service: ?[name], [name] 10/05/2021 10:00 A M ?Medical Record Number: [id number] ?Patient Account Number: [id number] ?Date of Birth/Sex: Treating RN: ?03/13/1956 (66 y.o. [name] ?Primary Care Greycen Felter: [name] ?Other Clinician: [name] ?Referring Corky Blumstein: ?Treating Makenzie Weisner/Extender: [name] ?[name] ?Weeks in Treatment: 5 ?Encounter Discharge Information Items ?Discharge Condition: Stable ?Ambulatory Status: Knee Scooter ?Discharge Destination: Home ?Transportation: Private Auto ?Accompanied By: Wife ?Schedule Follow-up Appointment: Yes ?Clinical Summary of Care: ?Electronic Signature(s) ?Signed: 10/05/2021 1:20:44 PM By: [name] EMT ?Entered By: [name] on 10/05/2021 13:20:44 ?-------------------------------------------------------------------------------- ?Vitals Details ?Patient Name: ?Date of Service: ?[name], [name] 10/05/2021 10:00 A M ?Medical Record Number: [id number] ?Patient Account Number: [id number] ?Date of Birth/Sex: ?Treating RN: ?12-30-1955 (66 y.o. [name] ?Primary Care Malayia Spizzirri: [name] ?Other Clinician: [name] ?Referring Kierre Deines: ?Treating Tenaya Hilyer/Extender: [name] ?[name] ?Weeks in Treatment: 5 ?Vital Signs ?Time Taken: 09:59 ?Temperature (??F): 97.6 ?Height (in): 74 ?Pulse (bpm): 80 ?Weight (lbs): 186 ?Respiratory Rate (breaths/min): 16 ?Body Mass Index (BMI): 23.9 ?Blood Pressure (mmHg): 100/62 ?Reference Range: 80 - 120 mg / dl ?Notes ?Paper version used prior to treatment. ?Electronic Signature(s) ?Signed: 10/05/2021 11:00:36 AM By: [name] EMT ?Entered By: [name] on 10/05/2021 11:00:35 ?

## 2021-10-05 NOTE — Progress Notes (Addendum)
DORAN, NESTLE [name]. ([id number]) ?Visit Report for 10/05/2021 ?HBO Details ?Patient Name: Date of Service: ?[name], [name]. 10/05/2021 10:00 [name] ?Medical Record Number: [id number] ?Patient Account Number: [id number] ?Date of Birth/Sex: Treating RN: ?[birth year] (66 y.o. [name] ?Primary Care Auren Valdes: [name] ?Other Clinician: [name] ?Referring Ailany Koren: ?Treating Burney Calzadilla/Extender: [name] ?[name] ?Weeks in Treatment: 5 ?HBO Treatment Course Details ?Treatment Course Number: 1 ?Ordering Kendarrius Tanzi: [name] ?T Treatments Ordered: ?otal 40 HBO Treatment Start Date: 09/20/2021 ?HBO Indication: ?Diabetic Ulcer(s) of the Lower Extremity ?Standard/Conservative Wound Care tried and failed greater than or equal to ?30 days ?Wound #1 Left, Medial Foot ?HBO Treatment Details ?Treatment Number: 11 ?Patient Type: Outpatient ?Chamber Type: Monoplace ?Chamber Serial #: [id number] ?Treatment Protocol: 2.5 ATA with 90 minutes oxygen, with two 5 minute air breaks ?Treatment Details ?Compression Rate Down: 2.0 psi / minute ?De-Compression Rate Up: 2.0 psi / minute ?[name] breaks and breathing ?ir ?Compress Tx Pressure periods Decompress Decompress ?Begins Reached (leave unused spaces Begins Ends ?blank) ?Chamber Pressure (ATA 1 2.5 2.5 2.5 2.5 2.5 - - 2.5 1 ?) ?Clock Time (24 hr) 10:03 10:14 10:45 10:50 11:20 11:25 - - 11:55 12:06 ?Treatment Length: 123 (minutes) ?Treatment Segments: 4 ?Vital Signs ?Capillary Blood Glucose Reference Range: 80 - 120 mg / dl ?HBO Diabetic Blood Glucose Intervention Range: <131 mg/dl or >782 mg/dl ?Type: Time Vitals Blood Pulse: Respiratory Temperature: Capillary Blood Glucose Pulse Action ?Taken: Pressure: Rate: Glucose (mg/dl): Meter #: Oximetry (%) Taken: ?Pre 09:53 100/62 80 16 97.6 Dr. Mikey Bussing informed of Blood Pressure. ?Pre 10:59 108/70 Dr. Mikey Bussing informed of Blood Pressure. ?Post 12:10 152/81 75 14 97.9 120 ?Treatment Response ?Treatment Toleration: Well ?Treatment  Completion Status: Treatment Completed without Adverse Event ?Treatment Notes ?Dr. Lady Gary informed of the patient's Blood pressure. Before starting his treatment, I asked him if he had any ear or sinus problems. He stated no. ?His treatment went well today without any problems. ?I asked him again after treatment if he had any problems with his sinus or ears, and he stated no. ?Additional Procedure Documentation ?Tissue Sevierity: Necrosis of bone ?Electronic Signature(s) ?Signed: 10/06/2021 12:33:02 PM By: [name] EMT ?Signed: 10/06/2021 5:05:56 PM By: [name] MD FACS ?Previous Signature: 10/05/2021 1:19:02 PM Version By: [name] EMT ?Previous Signature: 10/05/2021 2:56:10 PM Version By: [name] MD FACS ?Previous Signature: 10/05/2021 11:52:54 AM Version By: [name] MD FACS ?Previous Signature: 10/05/2021 11:04:40 AM Version By: [name] EMT ?Entered By: [name] on 10/06/2021 12:33:01 ?-------------------------------------------------------------------------------- ?HBO Safety Checklist Details ?Patient Name: ?Date of Service: ?[name], [name]. 10/05/2021 10:00 [name] ?Medical Record Number: [id number] ?Patient Account Number: [id number] ?Date of Birth/Sex: ?Treating RN: ?14-Oct-1955 (66 y.o. [name] ?Primary Care Taneika Choi: [name] ?Other Clinician: [name] ?Referring Amalio Loe: ?Treating Romanita Fager/Extender: [name] ?[name] ?Weeks in Treatment: 5 ?HBO Safety Checklist Items ?Safety Checklist ?Consent Form Signed ?Patient voided / foley secured and emptied ?When did you last eato 0715 ?Last dose of injectable or oral agent 0715 ?Ostomy pouch emptied and vented if applicable ?Colin ?All implantable devices assessed, documented and approved ?Colin ?Intravenous access site secured and place ?Colin PIC line right arm D/C on 10/01/2021 ?Valuables secured ?Linens and cotton and cotton/polyester blend (less than 51% polyester) ?Personal oil-based products /  skin lotions / body lotions removed ?Wigs or hairpieces removed ?Colin ?Smoking or tobacco materials removed ?Books / newspapers / magazines / loose paper removed ?Cologne, aftershave, perfume and deodorant  removed ?Jewelry removed (may wrap wedding band) ?Colin ?Make-up removed ?Colin ?Hair care products removed ?Battery operated devices (external) removed ?Heating patches and chemical warmers removed ?Titanium eyewear removed ?Colin ?Nail polish cured greater than 10 hours ?Colin ?Casting material cured greater than 10 hours ?Colin ?Hearing aids removed ?Colin ?Loose dentures or partials removed ?Colin ?Prosthetics have been removed ?Colin ?Patient demonstrates correct use of air break device (if applicable) ?Patient concerns have been addressed ?Patient grounding bracelet on and cord attached to chamber ?Specifics for Inpatients (complete in addition to above) ?Medication sheet sent with patient ?Colin ?Intravenous medications needed or due during therapy sent with patient ?Colin ?Drainage tubes (e.g. nasogastric tube or chest tube secured and vented) ?Colin ?Endotracheal or Tracheotomy tube secured ?Colin ?Cuff deflated of air and inflated with saline ?Colin ?Airway suctioned ?Colin ?Electronic Signature(s) ?Signed: 10/05/2021 11:02:04 AM By: [name] EMT ?Entered By: [name] on 10/05/2021 11:02:03 ?

## 2021-10-06 ENCOUNTER — Encounter (HOSPITAL_BASED_OUTPATIENT_CLINIC_OR_DEPARTMENT_OTHER): Payer: BC Managed Care – PPO | Admitting: General Surgery

## 2021-10-06 DIAGNOSIS — T8131XA Disruption of external operation (surgical) wound, not elsewhere classified, initial encounter: Secondary | ICD-10-CM | POA: Diagnosis not present

## 2021-10-06 DIAGNOSIS — D649 Anemia, unspecified: Secondary | ICD-10-CM | POA: Diagnosis not present

## 2021-10-06 DIAGNOSIS — Z951 Presence of aortocoronary bypass graft: Secondary | ICD-10-CM | POA: Diagnosis not present

## 2021-10-06 DIAGNOSIS — E11621 Type 2 diabetes mellitus with foot ulcer: Secondary | ICD-10-CM | POA: Diagnosis not present

## 2021-10-06 DIAGNOSIS — I10 Essential (primary) hypertension: Secondary | ICD-10-CM | POA: Diagnosis not present

## 2021-10-06 DIAGNOSIS — Z7984 Long term (current) use of oral hypoglycemic drugs: Secondary | ICD-10-CM | POA: Diagnosis not present

## 2021-10-06 DIAGNOSIS — M1990 Unspecified osteoarthritis, unspecified site: Secondary | ICD-10-CM | POA: Diagnosis not present

## 2021-10-06 DIAGNOSIS — L97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: Secondary | ICD-10-CM | POA: Diagnosis not present

## 2021-10-06 DIAGNOSIS — K219 Gastro-esophageal reflux disease without esophagitis: Secondary | ICD-10-CM | POA: Diagnosis not present

## 2021-10-06 DIAGNOSIS — Z7982 Long term (current) use of aspirin: Secondary | ICD-10-CM | POA: Diagnosis not present

## 2021-10-06 DIAGNOSIS — Z794 Long term (current) use of insulin: Secondary | ICD-10-CM | POA: Diagnosis not present

## 2021-10-06 DIAGNOSIS — M86672 Other chronic osteomyelitis, left ankle and foot: Secondary | ICD-10-CM | POA: Diagnosis not present

## 2021-10-06 DIAGNOSIS — L97522 Non-pressure chronic ulcer of other part of left foot with fat layer exposed: Secondary | ICD-10-CM | POA: Diagnosis not present

## 2021-10-06 DIAGNOSIS — Z452 Encounter for adjustment and management of vascular access device: Secondary | ICD-10-CM | POA: Diagnosis not present

## 2021-10-06 DIAGNOSIS — Z792 Long term (current) use of antibiotics: Secondary | ICD-10-CM | POA: Diagnosis not present

## 2021-10-06 DIAGNOSIS — E785 Hyperlipidemia, unspecified: Secondary | ICD-10-CM | POA: Diagnosis not present

## 2021-10-06 DIAGNOSIS — E1140 Type 2 diabetes mellitus with diabetic neuropathy, unspecified: Secondary | ICD-10-CM | POA: Diagnosis not present

## 2021-10-06 DIAGNOSIS — I252 Old myocardial infarction: Secondary | ICD-10-CM | POA: Diagnosis not present

## 2021-10-06 DIAGNOSIS — E11610 Type 2 diabetes mellitus with diabetic neuropathic arthropathy: Secondary | ICD-10-CM | POA: Diagnosis not present

## 2021-10-06 DIAGNOSIS — I2510 Atherosclerotic heart disease of native coronary artery without angina pectoris: Secondary | ICD-10-CM | POA: Diagnosis not present

## 2021-10-06 LAB — GLUCOSE, CAPILLARY
Glucose-Capillary: 176 mg/dL — ABNORMAL HIGH (ref 70–99)
Glucose-Capillary: 195 mg/dL — ABNORMAL HIGH (ref 70–99)

## 2021-10-06 NOTE — Progress Notes (Signed)
[name], Colin A. ([id number]) ?Visit Report for 10/06/2021 ?Problem List Details ?Patient Name: Date of Service: ?[name], [place]riev[place] A. 10/06/2021 10:00 A M ?Medical Record Number: [id number] ?Patient Account Number: [id number] ?Date of Birth/Sex: Treating RN: ?Sep 14, 1955 (66 y.o. 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 08/25/2021 No Yes ?E11.621 Type 2 diabetes mellitus with foot ulcer 08/25/2021 No Yes ?I25.10 Atherosclerotic heart disease of native coronary artery without angina pectoris 08/25/2021 No Yes ?[id number] 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 10/06/2021 ?Medical Record Number: [id number] ?Patient Account Number: [id number] ?Date of Birth/Sex: Treating RN: ?1955-07-05 (66 y.o. 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 ?Facility Procedures ?CPT4 Code: [id number] ?Description: G0277-(Facility Use Only) HBOT full body chamber, 11min , ICD-10 Diagnosis Description E11.621 Type 2 diabetes mellitus with foot ulcer 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 M86.672 Other chronic  ?osteomyelitis, left ankle and foot 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 ?Modifier: ?Quantity: 4 ?Physician Procedures ?: CPT4 Code Description Modifier [id number] - WC PHYS HYPERBARIC OXYGEN THERAPY ICD-10 Diagnosis Description E11.621 Type 2 diabetes mellitus with foot ulcer 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

## 2021-10-06 NOTE — Progress Notes (Addendum)
[name], Colin A. ([id number]) ?Visit Report for 10/06/2021 ?HBO Details ?Patient Name: Date of Service: ?[name], [name] 10/06/2021 10:00 A M ?Medical Record Number: [id number] ?Patient Account Number: [id number] ?Date of Birth/Sex: Treating RN: ?June 06, 1956 (66 y.o. [name] ?Primary Care Foster Sonnier: [name] ?Other Clinician: [name] ?Referring [name]: ?Treating Lilli Dewald/Extender: [name] ?[name] ?Weeks in Treatment: 6 ?HBO Treatment Course Details ?Treatment Course Number: 1 ?Ordering Winnie Barsky: [name] ?T Treatments Ordered: ?otal 40 HBO Treatment Start Date: 09/20/2021 ?HBO Indication: ?Diabetic Ulcer(s) of the Lower Extremity ?Standard/Conservative Wound Care tried and failed greater than or equal to ?30 days ?Wound #1 Left, Medial Foot ?HBO Treatment Details ?Treatment Number: 12 ?Patient Type: Outpatient ?Chamber Type: Monoplace ?Chamber Serial #: [id number] ?Treatment Protocol: 2.5 ATA with 90 minutes oxygen, with two 5 minute air breaks ?Treatment Details ?Compression Rate Down: 2.0 psi / minute ?De-Compression Rate Up: 2.0 psi / minute ?A breaks and breathing ?ir ?Compress Tx Pressure periods Decompress Decompress ?Begins Reached (leave unused spaces Begins Ends ?blank) ?Chamber Pressure (ATA 1 2.5 2.5 2.5 2.5 2.5 - - 2.5 1 ?) ?Clock Time (24 hr) 09:53 10:06 10:37 10:42 11:12 11:17 - - 11:47 12:01 ?Treatment Length: 128 (minutes) ?Treatment Segments: 4 ?Vital Signs ?Capillary Blood Glucose Reference Range: 80 - 120 mg / dl ?HBO Diabetic Blood Glucose Intervention Range: <131 mg/dl or >782 mg/dl ?Time Vitals Blood Respiratory Capillary Blood Glucose Pulse Action ?Type: ?Pulse: Temperature: ?Taken: ?Pressure: ?Rate: ?Glucose (mg/dl): ?Meter #: Oximetry (%) Taken: ?Pre 09:42 139/76 84 14 97.7 176 ?Post 12:07 143/86 78 14 98.1 195 ?Treatment Response ?Treatment Toleration: Well ?Treatment Completion Status: Treatment Completed without Adverse Event ?Treatment Notes ?Before  starting his treatment, I asked him if he had any ear or sinus problems. He stated no. ?His treatment went well today without any problems. ?I asked him again after treatment if he had any problems with his sinus or ears, and he stated no. ?Additional Procedure Documentation ?Tissue Sevierity: Necrosis of bone ?Physician HBO Attestation: ?I certify that I supervised this HBO treatment in accordance with Medicare ?guidelines. A trained emergency response team is readily available per Yes ?hospital policies and procedures. ?Continue HBOT as ordered. Yes ?Electronic Signature(s) ?Signed: 10/06/2021 5:01:05 PM By: [name] MD FACS ?Previous Signature: 10/06/2021 12:34:17 PM Version By: [name] EMT ?Previous Signature: 10/06/2021 12:34:17 PM Version By: [name] EMT ?Entered By: [name] on 10/06/2021 17:01:05 ?-------------------------------------------------------------------------------- ?HBO Safety Checklist Details ?Patient Name: ?Date of Service: ?[name], [name] 10/06/2021 10:00 A M ?Medical Record Number: [id number] ?Patient Account Number: [id number] ?Date of Birth/Sex: ?Treating RN: ?March 08, 1956 (66 y.o. [name] ?Primary Care [name]: [name] ?Other Clinician: [name] ?Referring [name]: ?Treating Amabel Stmarie/Extender: [name] ?[name] ?Weeks in Treatment: 6 ?HBO Safety Checklist Items ?Safety Checklist ?Consent Form Signed ?Patient voided / foley secured and emptied ?When did you last eato 0730 ?Last dose of injectable or oral agent 0730 ?Ostomy pouch emptied and vented if applicable ?NA ?All implantable devices assessed, documented and approved ?NA ?Intravenous access site secured and place ?NA ?Valuables secured ?Linens and cotton and cotton/polyester blend (less than 51% polyester) ?Personal oil-based products / skin lotions / body lotions removed ?Wigs or hairpieces removed ?NA ?Smoking or tobacco materials removed ?Books / newspapers / magazines /  loose paper removed ?Cologne, aftershave, perfume and deodorant removed ?Jewelry removed (may wrap wedding band) ?NA ?Make-up removed ?NA ?Hair care products removed ?Battery operated devices (external) removed ?Heating patches and chemical  warmers removed ?Titanium eyewear removed ?NA ?Nail polish cured greater than 10 hours ?NA ?Casting material cured greater than 10 hours ?NA ?Hearing aids removed ?NA ?Loose dentures or partials removed ?NA ?Prosthetics have been removed ?NA ?Patient demonstrates correct use of air break device (if applicable) ?Patient concerns have been addressed ?Patient grounding bracelet on and cord attached to chamber ?Specifics for Inpatients (complete in addition to above) ?Medication sheet sent with patient ?NA ?Intravenous medications needed or due during therapy sent with patient ?NA ?Drainage tubes (e.g. nasogastric tube or chest tube secured and vented) ?NA ?Endotracheal or Tracheotomy tube secured ?NA ?Cuff deflated of air and inflated with saline ?NA ?Airway suctioned ?NA ?Notes ?Paper version used prior to treatment. HBO safety check was done before treatment started. ?Electronic Signature(s) ?Signed: 10/06/2021 10:19:45 AM By: [name] EMT ?Entered By: [name] on 10/06/2021 10:19:45 ?

## 2021-10-06 NOTE — Progress Notes (Addendum)
SAMEER, TEEPLE A. ([id number]) ?Visit Report for 10/06/2021 ?Arrival Information Details ?Patient Name: Date of Service: ?[name], [name] 10/06/2021 10:00 A M ?Medical Record Number: [id number] ?Patient Account Number: [id number] ?Date of Birth/Sex: Treating RN: ?10-24-1955 (66 y.o. [name] ?Primary Care Tristin Gladman: [name] ?Other Clinician: [name] ?Referring Shamal Stracener: ?Treating Keyra Virella/Extender: [name] ?[name] ?Weeks in Treatment: 6 ?Visit Information History Since Last Visit ?All ordered tests and consults were completed: Yes ?Patient Arrived: Knee Scooter ?Added or deleted any medications: No ?Arrival Time: 09:21 ?Any new allergies or adverse reactions: No ?Accompanied By: None ?Had a fall or experienced change in No ?Transfer Assistance: None ?activities of daily living that may affect ?Patient Identification Verified: Yes ?risk of falls: ?Secondary Verification Process Completed: Yes ?Signs or symptoms of abuse/neglect since last visito No ?Patient Requires Transmission-Based Precautions: No ?Hospitalized since last visit: No ?Patient Has Alerts: Yes ?Implantable device outside of the clinic excluding No ?Patient Alerts: Patient on Blood Thinner cellular tissue based products placed in the center ?since last visit: ?Pain Present Now: No ?Notes ?Paper version used prior to treatment. ?Electronic Signature(s) ?Signed: 10/06/2021 10:11:52 AM By: [name] EMT ?Entered By: [name] on 10/06/2021 10:11:52 ?-------------------------------------------------------------------------------- ?Encounter Discharge Information Details ?Patient Name: Date of Service: ?[name], [name] 10/06/2021 10:00 A M ?Medical Record Number: [id number] ?Patient Account Number: [id number] ?Date of Birth/Sex: Treating RN: ?[birth year] (66 y.o. [name] ?Primary Care Glennice Marcos: [name] ?Other Clinician: [name] ?Referring Ezra Denne: ?Treating Chalese Peach/Extender: [name] ?[name] ?Weeks in Treatment: 6 ?Encounter Discharge Information Items ?Discharge Condition: Stable ?Ambulatory Status: Knee Scooter ?Discharge Destination: Home ?Transportation: Private Auto ?Accompanied By: Wife ?Schedule Follow-up Appointment: Yes ?Clinical Summary of Care: ?Electronic Signature(s) ?Signed: 10/06/2021 12:36:24 PM By: [name] EMT ?Entered By: [name] on 10/06/2021 12:36:24 ?-------------------------------------------------------------------------------- ?Vitals Details ?Patient Name: ?Date of Service: ?[name], [name] 10/06/2021 10:00 A M ?Medical Record Number: [id number] ?Patient Account Number: [id number] ?Date of Birth/Sex: ?Treating RN: ?12/03/1955 (66 y.o. [name] ?Primary Care Chivas Notz: [name] ?Other Clinician: [name] ?Referring Nur Rabold: ?Treating Mesa Janus/Extender: [name] ?[name] ?Weeks in Treatment: 6 ?Vital Signs ?Time Taken: 09:42 ?Temperature (??F): 97.7 ?Height (in): 74 ?Pulse (bpm): 84 ?Weight (lbs): 186 ?Respiratory Rate (breaths/min): 14 ?Body Mass Index (BMI): 23.9 ?Blood Pressure (mmHg): 139/76 ?Capillary Blood Glucose (mg/dl): 284 ?Reference Range: 80 - 120 mg / dl ?Notes ?Paper version used prior to treatment. ?Electronic Signature(s) ?Signed: 10/06/2021 10:12:33 AM By: [name] EMT ?Entered By: [name] on 10/06/2021 10:12:32 ?

## 2021-10-07 ENCOUNTER — Ambulatory Visit: Payer: BC Managed Care – PPO | Admitting: Cardiology

## 2021-10-07 ENCOUNTER — Encounter (HOSPITAL_BASED_OUTPATIENT_CLINIC_OR_DEPARTMENT_OTHER): Payer: BC Managed Care – PPO | Admitting: General Surgery

## 2021-10-07 DIAGNOSIS — T8131XA Disruption of external operation (surgical) wound, not elsewhere classified, initial encounter: Secondary | ICD-10-CM | POA: Diagnosis not present

## 2021-10-07 DIAGNOSIS — M86672 Other chronic osteomyelitis, left ankle and foot: Secondary | ICD-10-CM | POA: Diagnosis not present

## 2021-10-07 DIAGNOSIS — Z951 Presence of aortocoronary bypass graft: Secondary | ICD-10-CM | POA: Diagnosis not present

## 2021-10-07 DIAGNOSIS — I2510 Atherosclerotic heart disease of native coronary artery without angina pectoris: Secondary | ICD-10-CM | POA: Diagnosis not present

## 2021-10-07 DIAGNOSIS — E1140 Type 2 diabetes mellitus with diabetic neuropathy, unspecified: Secondary | ICD-10-CM | POA: Diagnosis not present

## 2021-10-07 DIAGNOSIS — E11610 Type 2 diabetes mellitus with diabetic neuropathic arthropathy: Secondary | ICD-10-CM | POA: Diagnosis not present

## 2021-10-07 DIAGNOSIS — E11621 Type 2 diabetes mellitus with foot ulcer: Secondary | ICD-10-CM | POA: Diagnosis not present

## 2021-10-07 DIAGNOSIS — L97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: Secondary | ICD-10-CM | POA: Diagnosis not present

## 2021-10-07 LAB — GLUCOSE, CAPILLARY
Glucose-Capillary: 180 mg/dL — ABNORMAL HIGH (ref 70–99)
Glucose-Capillary: 163 mg/dL — ABNORMAL HIGH (ref 70–99)

## 2021-10-07 NOTE — Progress Notes (Addendum)
[name], Colin A. ([id number]) ?Visit Report for 10/07/2021 ?HBO Details ?Patient Name: Date of Service: ?[name], [name] A. 10/07/2021 10:00 A M ?Medical Record Number: [id number] ?Patient Account Number: [id number] ?Date of Birth/Sex: Treating RN: ?1955-10-06 (66 y.o. M) Elesa Hacker, [name] ?Primary Care Flonnie Wierman: [name] ?Other Clinician: [name] ?Referring Tacarra Justo: ?Treating Hurshel Bouillon/Extender: [name] ?[name] ?Weeks in Treatment: 6 ?HBO Treatment Course Details ?Treatment Course Number: 1 ?Ordering Elsey Holts: [name] ?T Treatments Ordered: ?otal 40 HBO Treatment Start Date: 09/20/2021 ?HBO Indication: ?Diabetic Ulcer(s) of the Lower Extremity ?Standard/Conservative Wound Care tried and failed greater than or equal to ?30 days ?Wound #1 Left, Medial Foot ?HBO Treatment Details ?Treatment Number: 13 ?Patient Type: Outpatient ?Chamber Type: Monoplace ?Chamber Serial #: [id number] ?Treatment Protocol: 2.5 ATA with 90 minutes oxygen, with two 5 minute air breaks ?Treatment Details ?Compression Rate Down: 2.0 psi / minute De-Compression Rate Up: ?A breaks and breathing ?ir ?Compress Tx Pressure periods Decompress Decompress ?Begins Reached (leave unused spaces Begins Ends ?blank) ?Chamber Pressure (ATA 1 2.5 2.5 2.5 2.5 2.5 - - 2.5 1 ?) ?Clock Time (24 hr) 09:55 10:06 10:36 10:41 11:11 11:16 - - 11:46 11:57 ?Treatment Length: 122 (minutes) ?Treatment Segments: 4 ?Vital Signs ?Capillary Blood Glucose Reference Range: 80 - 120 mg / dl ?HBO Diabetic Blood Glucose Intervention Range: <131 mg/dl or >397 mg/dl ?Type: Time Vitals Blood Pulse: Respiratory Temperature: Capillary Blood Glucose Pulse Action ?Taken: Pressure: Rate: Glucose (mg/dl): Meter #: Oximetry (%) Taken: ?Pre 09:49 110/68 79 16 97.9 180 Informed Dr. Lady Gary of blood pressure ?Post 12:02 137/83 73 16 97.6 163 ?Treatment Response ?Treatment Toleration: Well ?Treatment Completion Status: Treatment Completed without Adverse Event ?Treatment  Notes ?Before starting his treatment, I asked him if he had any ear or sinus problems. He stated no. I informed Dr. Lady Gary of the patient's blood pressure. The ?patient stated that he felt great, and was not weak or dizzy. Dr. Lady Gary stated to go ahead with his treatment. ?The treatment went well today, with no problems noted. I asked after treatment if he had any problems with his ears or sinus. He stated no. ?Additional Procedure Documentation ?Tissue Sevierity: Necrosis of bone ?Physician HBO Attestation: ?I certify that I supervised this HBO treatment in accordance with Medicare ?guidelines. A trained emergency response team is readily available per Yes ?hospital policies and procedures. ?Continue HBOT as ordered. Yes ?Electronic Signature(s) ?Signed: 10/07/2021 12:57:53 PM By: [name] MD FACS ?Signed: 10/07/2021 12:57:53 PM By: [name] MD FACS ?Previous Signature: 10/07/2021 12:11:13 PM Version By: [name] EMT ?Previous Signature: 10/07/2021 11:38:20 AM Version By: [name] EMT ?Previous Signature: 10/07/2021 10:50:23 AM Version By: [name] EMT ?Entered By: [name] on 10/07/2021 12:57:52 ?-------------------------------------------------------------------------------- ?HBO Safety Checklist Details ?Patient Name: ?Date of Service: ?[name], [name] A. 10/07/2021 10:00 A M ?Medical Record Number: [id number] ?Patient Account Number: [id number] ?Date of Birth/Sex: ?Treating RN: ?1956/05/27 (66 y.o. M) Elesa Hacker, [name] ?Primary Care Lakaya Tolen: [name] ?Other Clinician: [name] ?Referring Lilya Smitherman: ?Treating Latiqua Daloia/Extender: [name] ?[name] ?Weeks in Treatment: 6 ?HBO Safety Checklist Items ?Safety Checklist ?Consent Form Signed ?Patient voided / foley secured and emptied ?When did you last eato 0730 ?Last dose of injectable or oral agent 0730 ?Ostomy pouch emptied and vented if applicable ?Colin ?All implantable devices assessed, documented and  approved ?Colin ?Intravenous access site secured and place ?Colin ?Valuables secured ?Linens and cotton and cotton/polyester blend (less than 51% polyester) ?Personal oil-based products / skin lotions / body  lotions removed ?Wigs or hairpieces removed ?Colin ?Smoking or tobacco materials removed ?Books / newspapers / magazines / loose paper removed ?Cologne, aftershave, perfume and deodorant removed ?Jewelry removed (may wrap wedding band) ?Colin ?Make-up removed ?Colin ?Hair care products removed ?Battery operated devices (external) removed ?Heating patches and chemical warmers removed ?Titanium eyewear removed ?Colin ?Nail polish cured greater than 10 hours ?Colin ?Casting material cured greater than 10 hours ?Colin ?Hearing aids removed ?Colin ?Loose dentures or partials removed ?Colin ?Prosthetics have been removed ?Patient demonstrates correct use of air break device (if applicable) ?Patient concerns have been addressed ?Patient grounding bracelet on and cord attached to chamber ?Specifics for Inpatients (complete in addition to above) ?Medication sheet sent with patient ?Colin ?Intravenous medications needed or due during therapy sent with patient ?Colin ?Drainage tubes (e.g. nasogastric tube or chest tube secured and vented) ?Colin ?Endotracheal or Tracheotomy tube secured ?Colin ?Cuff deflated of air and inflated with saline ?Colin ?Airway suctioned ?Colin ?Notes ?Paper version used prior to treatment. HBO safety check was done before treatment started. ?Electronic Signature(s) ?Signed: 10/07/2021 10:47:24 AM By: [name] EMT ?Entered By: [name] on 10/07/2021 10:47:24 ?

## 2021-10-07 NOTE — Progress Notes (Signed)
[name], Colin A. ([id number]) ?Visit Report for 10/07/2021 ?Problem List Details ?Patient Name: Date of Service: ?[name], [name] A. 10/07/2021 10:00 A M ?Medical Record Number: [id number] ?Patient Account Number: [id number] ?Date of Birth/Sex: Treating RN: ?1955/08/06 (66 y.o. 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 08/25/2021 No Yes ?E11.621 Type 2 diabetes mellitus with foot ulcer 08/25/2021 No Yes ?I25.10 Atherosclerotic heart disease of native coronary artery without angina pectoris 08/25/2021 No Yes ?A83.419 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 10/07/2021 ?Medical Record Number: [id number] ?Patient Account Number: [id number] ?Date of Birth/Sex: Treating RN: ?Oct 27, 1955 (66 y.o. 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Modifier: ?Quantity: 4 ?Physician Procedures ?: CPT4 Code Description Modifier [id number] [phone] - WC PHYS HYPERBARIC OXYGEN THERAPY ICD-10 Diagnosis Description E11.621 Type 2 diabetes mellitus with foot ulcer 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

## 2021-10-07 NOTE — Progress Notes (Addendum)
OMIR, GRIFFIN A. ([id number]) ?Visit Report for 10/07/2021 ?Arrival Information Details ?Patient Name: Date of Service: ?[name], Colin A. 10/07/2021 10:00 A M ?Medical Record Number: [id number] ?Patient Account Number: [id number] ?Date of Birth/Sex: Treating RN: ?November 30, 1955 (66 y.o. M) [name], [name] ?Primary Care Esterlene Atiyeh: [name] ?Other Clinician: [name] ?Referring Elizabet Schweppe: ?Treating Jarelis Ehlert/Extender: [name] ?[name] ?Weeks in Treatment: 6 ?Visit Information History Since Last Visit ?All ordered tests and consults were completed: Yes ?Patient Arrived: Ambulatory ?Added or deleted any medications: No ?Arrival Time: 09:30 ?Any new allergies or adverse reactions: No ?Accompanied By: Wife ?Had a fall or experienced change in No ?Transfer Assistance: None ?activities of daily living that may affect ?Patient Identification Verified: Yes ?risk of falls: ?Secondary Verification Process Completed: Yes ?Signs or symptoms of abuse/neglect since last visito No ?Patient Requires Transmission-Based Precautions: No ?Hospitalized since last visit: No ?Patient Has Alerts: Yes ?Implantable device outside of the clinic excluding No ?Patient Alerts: Patient on Blood Thinner cellular tissue based products placed in the center ?since last visit: ?Pain Present Now: No ?Notes ?Paper version used prior to treatment. ?Electronic Signature(s) ?Signed: 10/07/2021 10:45:35 AM By: [name] EMT ?Entered By: [name] on 10/07/2021 10:45:35 ?-------------------------------------------------------------------------------- ?Encounter Discharge Information Details ?Patient Name: Date of Service: ?[name], Colin A. 10/07/2021 10:00 A M ?Medical Record Number: [id number] ?Patient Account Number: [id number] ?Date of Birth/Sex: Treating RN: ?11/20/1955 (66 y.o. M) [name], [name] ?Primary Care Palestine Mosco: [name] ?Other Clinician: [name] ?Referring Ebubechukwu Jedlicka: ?Treating Daved Mcfann/Extender: [name] ?[name] ?Weeks in Treatment: 6 ?Encounter Discharge Information Items ?Discharge Condition: Stable ?Ambulatory Status: Knee Scooter ?Discharge Destination: Home ?Transportation: Private Auto ?Accompanied By: Wife ?Schedule Follow-up Appointment: Yes ?Clinical Summary of Care: ?Electronic Signature(s) ?Signed: 10/07/2021 12:18:05 PM By: [name] EMT ?Entered By: [name] on 10/07/2021 12:18:05 ?-------------------------------------------------------------------------------- ?Vitals Details ?Patient Name: ?Date of Service: ?[name], [name] A. 10/07/2021 10:00 A M ?Medical Record Number: [id number] ?Patient Account Number: [id number] ?Date of Birth/Sex: ?Treating RN: ?1955-09-11 (66 y.o. M) [name], [name] ?Primary Care Parry Po: [name] ?Other Clinician: [name] ?Referring Dain Laseter: ?Treating Desree Leap/Extender: [name] ?[name] ?Weeks in Treatment: 6 ?Vital Signs ?Time Taken: 09:49 ?Temperature (??F): 97.9 ?Height (in): 74 ?Pulse (bpm): 79 ?Weight (lbs): 186 ?Respiratory Rate (breaths/min): 16 ?Body Mass Index (BMI): 23.9 ?Blood Pressure (mmHg): 110/68 ?Capillary Blood Glucose (mg/dl): 180 ?Reference Range: 80 - 120 mg / dl ?Notes ?Paper version used prior to treatment. ?Electronic Signature(s) ?Signed: 10/07/2021 10:46:15 AM By: [name] EMT ?Entered By: [name] on 10/07/2021 10:46:15 ?

## 2021-10-08 ENCOUNTER — Encounter (HOSPITAL_BASED_OUTPATIENT_CLINIC_OR_DEPARTMENT_OTHER): Payer: BC Managed Care – PPO | Admitting: General Surgery

## 2021-10-08 DIAGNOSIS — I2510 Atherosclerotic heart disease of native coronary artery without angina pectoris: Secondary | ICD-10-CM | POA: Diagnosis not present

## 2021-10-08 DIAGNOSIS — E11621 Type 2 diabetes mellitus with foot ulcer: Secondary | ICD-10-CM | POA: Diagnosis not present

## 2021-10-08 DIAGNOSIS — M86672 Other chronic osteomyelitis, left ankle and foot: Secondary | ICD-10-CM | POA: Diagnosis not present

## 2021-10-08 DIAGNOSIS — E11610 Type 2 diabetes mellitus with diabetic neuropathic arthropathy: Secondary | ICD-10-CM | POA: Diagnosis not present

## 2021-10-08 DIAGNOSIS — E1140 Type 2 diabetes mellitus with diabetic neuropathy, unspecified: Secondary | ICD-10-CM | POA: Diagnosis not present

## 2021-10-08 DIAGNOSIS — Z951 Presence of aortocoronary bypass graft: Secondary | ICD-10-CM | POA: Diagnosis not present

## 2021-10-08 DIAGNOSIS — T8131XA Disruption of external operation (surgical) wound, not elsewhere classified, initial encounter: Secondary | ICD-10-CM | POA: Diagnosis not present

## 2021-10-08 DIAGNOSIS — L97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: Secondary | ICD-10-CM | POA: Diagnosis not present

## 2021-10-08 LAB — GLUCOSE, CAPILLARY
Glucose-Capillary: 113 mg/dL — ABNORMAL HIGH (ref 70–99)
Glucose-Capillary: 84 mg/dL (ref 70–99)
Glucose-Capillary: 145 mg/dL — ABNORMAL HIGH (ref 70–99)

## 2021-10-08 NOTE — Progress Notes (Addendum)
[name], Colin A. ([id number]) ?Visit Report for 10/08/2021 ?HBO Details ?Patient Name: Date of Service: ?[name], [name] A. 10/08/2021 10:00 A M ?Medical Record Number: [id number] ?Patient Account Number: [id number] ?Date of Birth/Sex: Treating RN: ?Dec 24, 1955 (66 y.o. M) [name] ?Primary Care Monserrath Junio: [name] ?Other Clinician: [name] ?Referring [name]: ?Treating Page Pucciarelli/Extender: [name] ?[name] ?Weeks in Treatment: 6 ?HBO Treatment Course Details ?Treatment Course Number: 1 ?Ordering Eliane Hammersmith: [name] ?T Treatments Ordered: ?otal 40 HBO Treatment Start Date: 09/20/2021 ?HBO Indication: ?Diabetic Ulcer(s) of the Lower Extremity ?Standard/Conservative Wound Care tried and failed greater than or equal to ?30 days ?Wound #1 Left, Medial Foot ?HBO Treatment Details ?Treatment Number: 14 ?Patient Type: Outpatient ?Chamber Type: Monoplace ?Chamber Serial #: [id number] ?Treatment Protocol: 2.5 ATA with 90 minutes oxygen, with two 5 minute air breaks ?Treatment Details ?A breaks and breathing ?ir ?Compress Tx Pressure periods Decompress Decompress ?Begins Reached (leave unused spaces Begins Ends ?blank) ?Chamber Pressure (ATA 1 2.5 2.5 2.5 2.5 2.5 - - 2.5 1 ?) ?Clock Time (24 hr) 10:21 10:32 11:02 11:07 11:37 11:42 - - 12:12 12:25 ?Treatment Length: 124 (minutes) ?Treatment Segments: 4 ?Vital Signs ?Capillary Blood Glucose Reference Range: 80 - 120 mg / dl ?HBO Diabetic Blood Glucose Intervention Range: <131 mg/dl or >983 mg/dl ?Type: Time Vitals Blood Pulse: Respiratory Temperature: Capillary Blood Glucose Pulse Action ?Taken: Pressure: Rate: Glucose (mg/dl): Meter #: Oximetry (%) Taken: ?Pre 09:37 110/71 81 16 97.3 84 Patient giver 8oz of Glucerna to drink ?Post 12:31 ?Pre 10:13 158/93 72 16 97.8 113 ?Treatment Response ?Treatment Toleration: Well ?Treatment Completion Status: Treatment Completed without Adverse Event ?Treatment Notes ?Before starting his treatment, I asked him if he  had ear or sinus problems. He stated no. Pre-treatment CBG was 84. Patient giver 8oz of Glucerna to drink., ? later rechecked CBG @ 1013 was 113. I informed Dr. Lady Gary of the patient's blood sugars. Dr. Lady Gary spoke with the patient, before his treatment. Dr. Marland KitchenLady Gary ok treatment for today. His treatment went well, with no problems noted. ?After his treatment, I asked if he had any sinus or ear problems. He stated no. The patient had a wound clinic visit, after his treatment ?Additional Procedure Documentation ?Tissue Sevierity: Necrosis of bone ?Physician HBO Attestation: ?I certify that I supervised this HBO treatment in accordance with Medicare ?guidelines. A trained emergency response team is readily available per Yes ?hospital policies and procedures. ?Continue HBOT as ordered. Yes ?Electronic Signature(s) ?Signed: 10/08/2021 2:49:12 PM By: [name] MD FACS ?Signed: 10/08/2021 2:49:12 PM By: [name] MD FACS ?Previous Signature: 10/08/2021 1:52:00 PM Version By: [name] EMT ?Entered By: [name] on 10/08/2021 14:49:12 ?-------------------------------------------------------------------------------- ?HBO Safety Checklist Details ?Patient Name: ?Date of Service: ?[name], [name] A. 10/08/2021 10:00 A M ?Medical Record Number: [id number] ?Patient Account Number: [id number] ?Date of Birth/Sex: ?Treating RN: ?[birth year] (66 y.o. M) [name] ?Primary Care Charletha Dalpe: [name] ?Other Clinician: [name] ?Referring Sahira Cataldi: ?Treating Lebert Lovern/Extender: [name] ?[name] ?Weeks in Treatment: 6 ?HBO Safety Checklist Items ?Safety Checklist ?Consent Form Signed ?Patient voided / foley secured and emptied ?When did you last eato 0730 ?Last dose of injectable or oral agent 0730 ?Ostomy pouch emptied and vented if applicable ?Colin ?All implantable devices assessed, documented and approved ?Colin ?Intravenous access site secured and place ?Colin ?Valuables secured ?Linens and  cotton and cotton/polyester blend (less than 51% polyester) ?Personal oil-based products / skin lotions / body lotions removed ?Wigs or hairpieces removed ?Colin ?Smoking  or tobacco materials removed ?Books / newspapers / magazines / loose paper removed ?Cologne, aftershave, perfume and deodorant removed ?Jewelry removed (may wrap wedding band) ?Colin ?Make-up removed ?Colin ?Hair care products removed ?Battery operated devices (external) removed ?Heating patches and chemical warmers removed ?Titanium eyewear removed ?Colin ?Nail polish cured greater than 10 hours ?Colin ?Casting material cured greater than 10 hours ?Colin ?Hearing aids removed ?Colin ?Loose dentures or partials removed ?Colin ?Prosthetics have been removed ?Colin ?Patient demonstrates correct use of air break device (if applicable) ?Patient concerns have been addressed ?Patient grounding bracelet on and cord attached to chamber ?Specifics for Inpatients (complete in addition to above) ?Medication sheet sent with patient ?Colin ?Intravenous medications needed or due during therapy sent with patient ?Colin ?Drainage tubes (e.g. nasogastric tube or chest tube secured and vented) ?Colin ?Endotracheal or Tracheotomy tube secured ?Colin ?Cuff deflated of air and inflated with saline ?Colin ?Airway suctioned ?Colin ?Notes ?Paper version used prior to treatment. HBO safety check was done before treatment started. ?Electronic Signature(s) ?Signed: 10/08/2021 1:44:25 PM By: [name] EMT ?Entered By: [name] on 10/08/2021 13:44:24 ?

## 2021-10-08 NOTE — Progress Notes (Signed)
QUINTRELL, BAZE A. ([id number]) ?Visit Report for 10/08/2021 ?Arrival Information Details ?Patient Name: Date of Service: ?[name], [name] 10/08/2021 10:00 A M ?Medical Record Number: [id number] ?Patient Account Number: [id number] ?Date of Birth/Sex: Treating RN: ?1956/04/08 (66 y.o. M) Elesa Hacker, [name] ?Primary Care Thiago Ragsdale: [name] ?Other Clinician: [name] ?Referring Emmerich Cryer: ?Treating Jireh Elmore/Extender: [name] ?[name] ?Weeks in Treatment: 6 ?Visit Information History Since Last Visit ?All ordered tests and consults were completed: Yes ?Patient Arrived: Knee Scooter ?Added or deleted any medications: No ?Arrival Time: 09:23 ?Any new allergies or adverse reactions: No ?Accompanied By: Wife and family ?Had a fall or experienced change in No ?Transfer Assistance: None ?activities of daily living that may affect ?Patient Identification Verified: Yes ?risk of falls: ?Secondary Verification Process Completed: Yes ?Signs or symptoms of abuse/neglect since last visito No ?Patient Requires Transmission-Based Precautions: No ?Hospitalized since last visit: No ?Patient Has Alerts: Yes ?Implantable device outside of the clinic excluding No ?Patient Alerts: Patient on Blood Thinner cellular tissue based products placed in the center ?since last visit: ?Pain Present Now: No ?Notes ?Paper version used prior to treatment. ?Electronic Signature(s) ?Signed: 10/08/2021 1:42:37 PM By: [name] EMT ?Entered By: [name] on 10/08/2021 13:42:37 ?-------------------------------------------------------------------------------- ?Encounter Discharge Information Details ?Patient Name: Date of Service: ?[name], [name] 10/08/2021 10:00 A M ?Medical Record Number: [id number] ?Patient Account Number: [id number] ?Date of Birth/Sex: Treating RN: ?Jan 12, 1956 (66 y.o. M) Elesa Hacker, [name] ?Primary Care Hlee Fringer: [name] ?Other Clinician: [name] ?Referring Twan Harkin: ?Treating Richanda Darin/Extender: [name] ?[name] ?Weeks in Treatment: 6 ?Encounter Discharge Information Items ?Discharge Condition: Stable ?Ambulatory Status: Knee Scooter ?Discharge Destination: Other (Note Required) ?Accompanied By: Wife and family ?Schedule Follow-up Appointment: Yes ?Clinical Summary of Care: ?Notes ?The patient had a wound clinic visit after his treatment. ?Electronic Signature(s) ?Signed: 10/08/2021 1:54:56 PM By: [name] EMT ?Entered By: [name] on 10/08/2021 13:54:56 ?-------------------------------------------------------------------------------- ?Vitals Details ?Patient Name: ?Date of Service: ?[name], [name] 10/08/2021 10:00 A M ?Medical Record Number: [id number] ?Patient Account Number: [id number] ?Date of Birth/Sex: ?Treating RN: ?Oct 11, 1955 (66 y.o. M) Elesa Hacker, [name] ?Primary Care Chaka Boyson: [name] ?Other Clinician: [name] ?Referring Mileigh Tilley: ?Treating Haydn [name]/Extender: [name] ?[name] ?Weeks in Treatment: 6 ?Vital Signs ?Time Taken: 09:37 ?Temperature (??F): 97.3 ?Height (in): 74 ?Pulse (bpm): 81 ?Weight (lbs): 186 ?Respiratory Rate (breaths/min): 16 ?Body Mass Index (BMI): 23.9 ?Blood Pressure (mmHg): 110/71 ?Capillary Blood Glucose (mg/dl): 84 ?Reference Range: 80 - 120 mg / dl ?Notes ?Paper version used prior to treatment. ?Electronic Signature(s) ?Signed: 10/08/2021 1:43:11 PM By: [name] EMT ?Entered By: [name] on 10/08/2021 13:43:11 ?

## 2021-10-08 NOTE — Progress Notes (Signed)
[name], Colin A. ([id number]) ?Visit Report for 10/08/2021 ?Problem List Details ?Patient Name: Date of Service: ?[name], Colin View A. 10/08/2021 10:00 A M ?Medical Record Number: [id number] ?Patient Account Number: [id number] ?Date of Birth/Sex: Treating RN: ?09-Jun-1956 (66 y.o. 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 08/25/2021 No Yes ?E11.621 Type 2 diabetes mellitus with foot ulcer 08/25/2021 No Yes ?I25.10 Atherosclerotic heart disease of native coronary artery without angina pectoris 08/25/2021 No Yes ?[id number] 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 10/08/2021 ?Medical Record Number: [id number] ?Patient Account Number: [id number] ?Date of Birth/Sex: Treating RN: ?Feb 23, 1956 (66 y.o. 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 ?Facility Procedures ?CPT4 Code: [id number] ?Description: G0277-(Facility Use Only) HBOT full body chamber, 41min , ICD-10 Diagnosis Description E11.621 Type 2 diabetes mellitus with foot ulcer 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 M86.672 Other chronic  ?osteomyelitis, left ankle and foot 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 ?Modifier: ?Quantity: 4 ?Physician Procedures ?: CPT4 Code Description Modifier [id number] - WC PHYS HYPERBARIC OXYGEN THERAPY ICD-10 Diagnosis Description E11.621 Type 2 diabetes mellitus with foot ulcer 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

## 2021-10-09 DIAGNOSIS — T8131XA Disruption of external operation (surgical) wound, not elsewhere classified, initial encounter: Secondary | ICD-10-CM | POA: Diagnosis not present

## 2021-10-10 DIAGNOSIS — T8131XA Disruption of external operation (surgical) wound, not elsewhere classified, initial encounter: Secondary | ICD-10-CM | POA: Diagnosis not present

## 2021-10-11 ENCOUNTER — Encounter (HOSPITAL_BASED_OUTPATIENT_CLINIC_OR_DEPARTMENT_OTHER): Payer: BC Managed Care – PPO | Attending: General Surgery | Admitting: General Surgery

## 2021-10-11 DIAGNOSIS — I252 Old myocardial infarction: Secondary | ICD-10-CM | POA: Diagnosis not present

## 2021-10-11 DIAGNOSIS — Z951 Presence of aortocoronary bypass graft: Secondary | ICD-10-CM | POA: Diagnosis not present

## 2021-10-11 DIAGNOSIS — M86672 Other chronic osteomyelitis, left ankle and foot: Secondary | ICD-10-CM | POA: Diagnosis not present

## 2021-10-11 DIAGNOSIS — E1169 Type 2 diabetes mellitus with other specified complication: Secondary | ICD-10-CM | POA: Diagnosis not present

## 2021-10-11 DIAGNOSIS — Z7984 Long term (current) use of oral hypoglycemic drugs: Secondary | ICD-10-CM | POA: Diagnosis not present

## 2021-10-11 DIAGNOSIS — Z7982 Long term (current) use of aspirin: Secondary | ICD-10-CM | POA: Diagnosis not present

## 2021-10-11 DIAGNOSIS — E785 Hyperlipidemia, unspecified: Secondary | ICD-10-CM | POA: Diagnosis not present

## 2021-10-11 DIAGNOSIS — T8131XA Disruption of external operation (surgical) wound, not elsewhere classified, initial encounter: Secondary | ICD-10-CM | POA: Diagnosis not present

## 2021-10-11 DIAGNOSIS — E11621 Type 2 diabetes mellitus with foot ulcer: Secondary | ICD-10-CM | POA: Diagnosis not present

## 2021-10-11 DIAGNOSIS — I10 Essential (primary) hypertension: Secondary | ICD-10-CM | POA: Diagnosis not present

## 2021-10-11 DIAGNOSIS — D649 Anemia, unspecified: Secondary | ICD-10-CM | POA: Diagnosis not present

## 2021-10-11 DIAGNOSIS — Z794 Long term (current) use of insulin: Secondary | ICD-10-CM | POA: Diagnosis not present

## 2021-10-11 DIAGNOSIS — Z452 Encounter for adjustment and management of vascular access device: Secondary | ICD-10-CM | POA: Diagnosis not present

## 2021-10-11 DIAGNOSIS — I2510 Atherosclerotic heart disease of native coronary artery without angina pectoris: Secondary | ICD-10-CM | POA: Insufficient documentation

## 2021-10-11 DIAGNOSIS — Z792 Long term (current) use of antibiotics: Secondary | ICD-10-CM | POA: Diagnosis not present

## 2021-10-11 DIAGNOSIS — M1990 Unspecified osteoarthritis, unspecified site: Secondary | ICD-10-CM | POA: Diagnosis not present

## 2021-10-11 DIAGNOSIS — E11610 Type 2 diabetes mellitus with diabetic neuropathic arthropathy: Secondary | ICD-10-CM | POA: Insufficient documentation

## 2021-10-11 DIAGNOSIS — L97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: Secondary | ICD-10-CM | POA: Diagnosis not present

## 2021-10-11 DIAGNOSIS — E1140 Type 2 diabetes mellitus with diabetic neuropathy, unspecified: Secondary | ICD-10-CM | POA: Diagnosis not present

## 2021-10-11 DIAGNOSIS — K219 Gastro-esophageal reflux disease without esophagitis: Secondary | ICD-10-CM | POA: Diagnosis not present

## 2021-10-11 DIAGNOSIS — L97522 Non-pressure chronic ulcer of other part of left foot with fat layer exposed: Secondary | ICD-10-CM | POA: Diagnosis not present

## 2021-10-11 LAB — GLUCOSE, CAPILLARY
Glucose-Capillary: 214 mg/dL — ABNORMAL HIGH (ref 70–99)
Glucose-Capillary: 221 mg/dL — ABNORMAL HIGH (ref 70–99)

## 2021-10-11 NOTE — Progress Notes (Addendum)
JAQWAN, WIEBER A. ([id number]) ?Visit Report for 10/11/2021 ?HBO Details ?Patient Name: Date of Service: ?[name], [name] 10/11/2021 10:00 A M ?Medical Record Number: [id number] ?Patient Account Number: [id number] ?Date of Birth/Sex: Treating RN: ?[name], 1955 (66 y.o. M) Elesa Hacker, [name] ?Primary Care Evangela Heffler: [name] ?Other Clinician: [name] ?Referring Barbi Kumagai: ?Treating Tonye Tancredi/Extender: [name] ?[name] ?Weeks in Treatment: 6 ?HBO Treatment Course Details ?Treatment Course Number: 1 ?Ordering Kateland Leisinger: [name] ?T Treatments Ordered: ?otal 40 HBO Treatment Start Date: 09/20/2021 ?HBO Indication: ?Diabetic Ulcer(s) of the Lower Extremity ?Standard/Conservative Wound Care tried and failed greater than or equal to ?30 days ?Wound #1 Left, Medial Foot ?HBO Treatment Details ?Treatment Number: 15 ?Patient Type: Outpatient ?Chamber Type: Monoplace ?Chamber Serial #: [id number] ?Treatment Protocol: 2.5 ATA with 90 minutes oxygen, with two 5 minute air breaks ?Treatment Details ?Compression Rate Down: 2.0 psi / minute ?De-Compression Rate Up: 2.0 psi / minute ?A breaks and breathing ?ir ?Compress Tx Pressure periods Decompress Decompress ?Begins Reached (leave unused spaces Begins Ends ?blank) ?Chamber Pressure (ATA 1 2.5 2.5 2.5 2.5 2.5 - - 2.5 1 ?) ?Clock Time (24 hr) 10:24 10:36 11:06 11:11 11:41 11:47 - - 12:17 12:29 ?Treatment Length: 125 (minutes) ?Treatment Segments: 4 ?Vital Signs ?Capillary Blood Glucose Reference Range: 80 - 120 mg / dl ?HBO Diabetic Blood Glucose Intervention Range: <131 mg/dl or >010 mg/dl ?Time Vitals Blood Respiratory Capillary Blood Glucose Pulse Action ?Type: ?Pulse: Temperature: ?Taken: ?Pressure: ?Rate: ?Glucose (mg/dl): ?Meter #: Oximetry (%) Taken: ?Pre 10:00 127/85 79 16 97.5 214 ?Post 12:35 126/94 76 16 97.5 221 ?Treatment Response ?Treatment Toleration: Well ?Treatment Completion Status: Treatment Completed without Adverse Event ?Treatment Notes ?Before  starting his treatment, I asked him if he had ear or sinus problems. He stated no. ?His treatment went well today, with no problems noted. ?After his treatment, I asked if he had any sinus or ear problems. He stated no. The patient had a wound clinic visit, after his treatment ?Additional Procedure Documentation ?Tissue Sevierity: Necrosis of bone ?Physician HBO Attestation: ?I certify that I supervised this HBO treatment in accordance with Medicare ?guidelines. A trained emergency response team is readily available per Yes ?hospital policies and procedures. ?Continue HBOT as ordered. Yes ?Electronic Signature(s) ?Signed: 10/11/2021 4:02:00 PM By: [name] MD FACS ?Previous Signature: 10/11/2021 2:34:22 PM Version By: [name] EMT ?Previous Signature: 10/11/2021 2:34:22 PM Version By: [name] EMT ?Entered By: [name] on 10/11/2021 16:02:00 ?-------------------------------------------------------------------------------- ?HBO Safety Checklist Details ?Patient Name: ?Date of Service: ?[name], [name] 10/11/2021 10:00 A M ?Medical Record Number: [id number] ?Patient Account Number: [id number] ?Date of Birth/Sex: ?Treating RN: ?Dec 25, 1955 (66 y.o. M) Elesa Hacker, [name] ?Primary Care Maxcine Strong: [name] ?Other Clinician: [name] ?Referring Aalina Brege: ?Treating Chloe Flis/Extender: [name] ?[name] ?Weeks in Treatment: 6 ?HBO Safety Checklist Items ?Safety Checklist ?Consent Form Signed ?Patient voided / foley secured and emptied ?When did you last eato 0730 ?Last dose of injectable or oral agent 0730 ?Ostomy pouch emptied and vented if applicable ?NA ?All implantable devices assessed, documented and approved ?NA ?Intravenous access site secured and place ?NA ?Valuables secured ?Linens and cotton and cotton/polyester blend (less than 51% polyester) ?Personal oil-based products / skin lotions / body lotions removed ?Wigs or hairpieces removed ?NA ?Smoking or tobacco materials  removed ?Books / newspapers / magazines / loose paper removed ?Cologne, aftershave, perfume and deodorant removed ?Jewelry removed (may wrap wedding band) ?NA ?Make-up removed ?NA ?Hair care products removed ?Battery operated devices (  external) removed ?Heating patches and chemical warmers removed ?Titanium eyewear removed ?NA ?Nail polish cured greater than 10 hours ?NA ?Casting material cured greater than 10 hours ?NA ?Hearing aids removed ?NA ?Loose dentures or partials removed ?NA ?Prosthetics have been removed ?NA ?Patient demonstrates correct use of air break device (if applicable) ?Patient concerns have been addressed ?Patient grounding bracelet on and cord attached to chamber ?Specifics for Inpatients (complete in addition to above) ?Medication sheet sent with patient ?NA ?Intravenous medications needed or due during therapy sent with patient ?NA ?Drainage tubes (e.g. nasogastric tube or chest tube secured and vented) ?NA ?Endotracheal or Tracheotomy tube secured ?NA ?Cuff deflated of air and inflated with saline ?NA ?Airway suctioned ?NA ?Notes ?Paper version used prior to treatment. HBO safety check was done before treatment started. ?Electronic Signature(s) ?Signed: 10/11/2021 2:31:25 PM By: [name] EMT ?Entered By: [name] on 10/11/2021 14:31:25 ?

## 2021-10-11 NOTE — Progress Notes (Signed)
[name], Colin A. ([id number]) ?Visit Report for 10/08/2021 ?Arrival Information Details ?Patient Name: Date of Service: ?[name] 10/08/2021 12:45 PM ?Medical Record Number: [id number] ?Patient Account Number: [id number] ?Date of Birth/Sex: Treating RN: ?14-Aug-1955 (66 y.o. [name] ?Primary Care Liannah Yarbough: [name] Other Clinician: ?Referring Shaili Donalson: ?Treating Gurkirat Basher/Extender: [name] ?[name] ?Weeks in Treatment: 6 ?Visit Information History Since Last Visit ?Added or deleted any medications: No ?Patient Arrived: Knee Scooter ?Any new allergies or adverse reactions: No ?Arrival Time: 12:45 ?Had a fall or experienced change in No ?Accompanied By: alone ?activities of daily living that may affect ?Transfer Assistance: None ?risk of falls: ?Patient Identification Verified: Yes ?Signs or symptoms of abuse/neglect since last visito No ?Secondary Verification Process Completed: Yes ?Hospitalized since last visit: No ?Patient Requires Transmission-Based Precautions: No ?Implantable device outside of the clinic excluding No ?Patient Has Alerts: Yes ?cellular tissue based products placed in the center ?Patient Alerts: Patient on Blood Thinner since last visit: ?Has Dressing in Place as Prescribed: Yes ?Has Compression in Place as Prescribed: Yes ?Pain Present Now: No ?Electronic Signature(s) ?Signed: 10/11/2021 5:50:11 PM By: Levan Hurst RN, BSN ?Entered By: Levan Hurst on 10/08/2021 12:45:52 ?-------------------------------------------------------------------------------- ?Encounter Discharge Information Details ?Patient Name: Date of Service: ?[name] 10/08/2021 12:45 PM ?Medical Record Number: [id number] ?Patient Account Number: [id number] ?Date of Birth/Sex: Treating RN: ?19-Oct-1955 (66 y.o. [name] ?Primary Care Colbert Curenton: [name] Other Clinician: ?Referring Muzamil Harker: ?Treating Faatima Tench/Extender: [name] ?[name] ?Weeks in Treatment: 6 ?Encounter  Discharge Information Items Post Procedure Vitals ?Discharge Condition: Stable ?Temperature (F): 97.8 ?Ambulatory Status: Knee Scooter ?Pulse (bpm): 72 ?Discharge Destination: Home ?Respiratory Rate (breaths/min): 16 ?Transportation: Private Auto ?Blood Pressure (mmHg): 158/93 ?Accompanied By: alone ?Schedule Follow-up Appointment: Yes ?Clinical Summary of Care: Patient Declined ?Electronic Signature(s) ?Signed: 10/11/2021 5:50:11 PM By: Levan Hurst RN, BSN ?Entered By: Levan Hurst on 10/08/2021 15:58:48 ?-------------------------------------------------------------------------------- ?Lower Extremity Assessment Details ?Patient Name: ?Date of Service: ?[name] 10/08/2021 12:45 PM ?Medical Record Number: [id number] ?Patient Account Number: [id number] ?Date of Birth/Sex: ?Treating RN: ?[birth year] (66 y.o. [name] ?Primary Care Yaneth Fairbairn: [name] ?Other Clinician: ?Referring Karista Aispuro: ?Treating Dalyn Kjos/Extender: [name] ?[name] ?Weeks in Treatment: 6 ?Edema Assessment ?Assessed: [Left: No] [Right: No] ?Edema: [Left: Ye] [Right: s] ?Calf ?Left: Right: ?Point of Measurement: From Medial Instep 36 cm ?Ankle ?Left: Right: ?Point of Measurement: From Medial Instep 23 cm ?Vascular Assessment ?Pulses: ?Dorsalis Pedis ?Palpable: [Left:Yes] ?Electronic Signature(s) ?Signed: 10/11/2021 5:50:11 PM By: Levan Hurst RN, BSN ?Entered By: Levan Hurst on 10/08/2021 12:55:52 ?-------------------------------------------------------------------------------- ?Multi Wound Chart Details ?Patient Name: ?Date of Service: ?[name] 10/08/2021 12:45 PM ?Medical Record Number: [id number] ?Patient Account Number: [id number] ?Date of Birth/Sex: ?Treating RN: ?Oct 01, 1955 (66 y.o. [name] ?Primary Care Ustin Cruickshank: [name] ?Other Clinician: ?Referring Shandreka Dante: ?Treating Terris Bodin/Extender: [name] ?[name] ?Weeks in Treatment: 6 ?Vital Signs ?Height(in): 74 ?Capillary Blood  Glucose(mg/dl): 158 ?Weight(lbs): 186 ?Pulse(bpm): 72 ?Body Mass Index(BMI): 23.9 ?Blood Pressure(mmHg): 158/93 ?Temperature(??F): 97.8 ?Respiratory Rate(breaths/min): 16 ?Photos: [N/A:N/A] ?Left, Medial Foot N/A N/A ?Wound Location: ?Gradually Appeared N/A N/A ?Wounding Event: ?Diabetic Wound/Ulcer of the Lower N/A N/A ?Primary Etiology: ?Extremity ?Cataracts, Coronary Artery Disease, N/A N/A ?Comorbid History: ?Hypertension, Myocardial Infarction, ?Peripheral Arterial Disease, Type II ?Diabetes, Osteomyelitis, Neuropathy ?02/22/2021 N/A N/A ?Date Acquired: ?6 N/A N/A ?Weeks of Treatment: ?Open N/A N/A ?Wound Status: ?No N/A N/A ?Wound Recurrence: ?5.9x4.3x1.6 N/A N/A ?Measurements L x W x  D (cm) ?19.926 N/A N/A ?A (cm?) : ?rea ?31.881 N/A N/A ?Volume (cm?) : ?-40.90% N/A N/A ?% Reduction in A rea: ?-125.50% N/A N/A ?% Reduction in Volume: ?10 ?Position 1 (o'clock): ?4.2 ?Maximum Distance 1 (cm): ?12 ?Position 2 (o'clock): ?2 ?Maximum Distance 2 (cm): ?Yes N/A N/A ?Tunneling: ?Grade 3 N/A N/A ?Classification: ?Medium N/A N/A ?Exudate A mount: ?Serosanguineous N/A N/A ?Exudate Type: ?red, brown N/A N/A ?Exudate Color: ?Distinct, outline attached N/A N/A ?Wound Margin: ?Large (67-100%) N/A N/A ?Granulation A mount: ?Red, Pink, Hyper-granulation N/A N/A ?Granulation Quality: ?Small (1-33%) N/A N/A ?Necrotic A mount: ?Fat Layer (Subcutaneous Tissue): Yes N/A N/A ?Exposed Structures: ?Bone: Yes ?Fascia: No ?Tendon: No ?Muscle: No ?Joint: No ?Small (1-33%) N/A N/A ?Epithelialization: ?Debridement - Selective/Open Wound N/A N/A ?Debridement: ?Pre-procedure Verification/Time Out 13:06 N/A N/A ?Taken: ?[facility] N/A N/A ?Tissue Debrided: ?Non-Viable Tissue N/A N/A ?Level: ?25.37 N/A N/A ?Debridement A (sq cm): ?rea ?Curette N/A N/A ?Instrument: ?Minimum N/A N/A ?Bleeding: ?Pressure N/A N/A ?Hemostasis A chieved: ?0 N/A N/A ?Procedural Pain: ?0 N/A N/A ?Post Procedural Pain: ?Procedure was tolerated well N/A N/A ?Debridement  Treatment Response: ?5.9x4.3x1.6 N/A N/A ?Post Debridement Measurements L x ?W x D (cm) ?31.881 N/A N/A ?Post Debridement Volume: (cm?) ?Debridement N/A N/A ?Procedures Performed: ?Treatment Notes ?Electronic Signature(s) ?Signed: 10/08/2021 1:12:12 PM By: [name] MD FACS ?Signed: 10/11/2021 5:50:11 PM By: Levan Hurst RN, BSN ?Entered By: [name] on 10/08/2021 13:12:12 ?-------------------------------------------------------------------------------- ?Multi-Disciplinary Care Plan Details ?Patient Name: ?Date of Service: ?[name] 10/08/2021 12:45 PM ?Medical Record Number: [id number] ?Patient Account Number: [id number] ?Date of Birth/Sex: ?Treating RN: ?1956-03-05 (66 y.o. [name] ?Primary Care Kasi Lasky: [name] ?Other Clinician: ?Referring Zared Knoth: ?Treating Javad Salva/Extender: [name] ?[name] ?Weeks in Treatment: 6 ?Multidisciplinary Care Plan reviewed with physician ?Active Inactive ?HBO ?Nursing Diagnoses: ?Anxiety related to feelings of confinement associated with the hyperbaric oxygen chamber ?Anxiety related to knowledge deficit of hyperbaric oxygen therapy and treatment procedures ?Discomfort related to temperature and humidity changes inside hyperbaric chamber ?Potential for barotraumas to ears, sinuses, teeth, and lungs or cerebral gas embolism related to changes in atmospheric pressure inside hyperbaric oxygen ?chamber ?Potential for oxygen toxicity seizures related to delivery of 100% oxygen at an increased atmospheric pressure ?Potential for pulmonary oxygen toxicity related to delivery of 100% oxygen at an increased atmospheric pressure ?Goals: ?Barotrauma will be prevented during HBO2 ?Date Initiated: 09/17/2021 ?T arget Resolution Date: 10/15/2021 ?Goal Status: Active ?Patient will tolerate the hyperbaric oxygen therapy treatment ?Date Initiated: 09/17/2021 ?T arget Resolution Date: 10/15/2021 ?Goal Status: Active ?Patient will tolerate the internal climate  of the chamber ?Date Initiated: 09/17/2021 ?T arget Resolution Date: 10/15/2021 ?Goal Status: Active ?Patient/caregiver will verbalize understanding of HBO goals, rationale, procedures and potential hazards ?Date

## 2021-10-11 NOTE — Progress Notes (Addendum)
[name], Colin A. ([id number]) ?Visit Report for 10/11/2021 ?Arrival Information Details ?Patient Name: Date of Service: ?[name], [place]. 10/11/2021 10:00 A M ?Medical Record Number: [id number] ?Patient Account Number: [id number] ?Date of Birth/Sex: Treating RN: ?[birth year] (66 y.o. M) [name], [name] ?Primary Care Jakell Trusty: [name] ?Other Clinician: [name] ?Referring Lavena Loretto: ?Treating Clotile Whittington/Extender: [name] ?[name] ?Weeks in Treatment: 6 ?Visit Information History Since Last Visit ?All ordered tests and consults were completed: Yes ?Patient Arrived: Knee Scooter ?Added or deleted any medications: No ?Arrival Time: 09:56 ?Any new allergies or adverse reactions: No ?Accompanied By: None ?Had a fall or experienced change in No ?Transfer Assistance: None ?activities of daily living that may affect ?Patient Identification Verified: Yes ?risk of falls: ?Secondary Verification Process Completed: Yes ?Signs or symptoms of abuse/neglect since last visito No ?Patient Requires Transmission-Based Precautions: No ?Hospitalized since last visit: No ?Patient Has Alerts: Yes ?Implantable device outside of the clinic excluding No ?Patient Alerts: Patient on Blood Thinner cellular tissue based products placed in the center ?since last visit: ?Pain Present Now: No ?Notes ?Paper version used prior to treatment. ?Electronic Signature(s) ?Signed: 10/11/2021 2:36:29 PM By: [name] EMT ?Previous Signature: 10/11/2021 2:29:30 PM Version By: [name] EMT ?Entered By: [name] on 10/11/2021 14:36:29 ?-------------------------------------------------------------------------------- ?Encounter Discharge Information Details ?Patient Name: Date of Service: ?[name], [name]. 10/11/2021 10:00 A M ?Medical Record Number: [id number] ?Patient Account Number: [id number] ?Date of Birth/Sex: Treating RN: ?Dec 09, 1955 (66 y.o. M) [name], [name] ?Primary Care Shanin Szymanowski: [name] ?Other Clinician: [name] ?Referring Tyller Bowlby: ?Treating Aahan Marques/Extender: [name] ?[name] ?Weeks in Treatment: 6 ?Encounter Discharge Information Items ?Discharge Condition: Stable ?Ambulatory Status: Knee Scooter ?Discharge Destination: Home ?Transportation: Private Auto ?Accompanied By: Wife ?Schedule Follow-up Appointment: No ?Clinical Summary of Care: ?Electronic Signature(s) ?Signed: 10/11/2021 2:36:12 PM By: [name] EMT ?Entered By: [name] on 10/11/2021 14:36:12 ?-------------------------------------------------------------------------------- ?Vitals Details ?Patient Name: ?Date of Service: ?[name], [name]. 10/11/2021 10:00 A M ?Medical Record Number: [id number] ?Patient Account Number: [id number] ?Date of Birth/Sex: ?Treating RN: ?[birth year] (66 y.o. M) [name], [name] ?Primary Care Jinelle Butchko: [name] ?Other Clinician: [name] ?Referring Maciah Feeback: ?Treating Isahi Godwin/Extender: [name] ?[name] ?Weeks in Treatment: 6 ?Vital Signs ?Time Taken: 10:00 ?Temperature (??F): 97.5 ?Height (in): 74 ?Pulse (bpm): 79 ?Weight (lbs): 186 ?Respiratory Rate (breaths/min): 16 ?Body Mass Index (BMI): 23.9 ?Blood Pressure (mmHg): 127/85 ?Capillary Blood Glucose (mg/dl): 214 ?Reference Range: 80 - 120 mg / dl ?Electronic Signature(s) ?Signed: 10/11/2021 2:30:06 PM By: [name] EMT ?Entered By: [name] on 10/11/2021 14:30:06 ?

## 2021-10-11 NOTE — Progress Notes (Signed)
RAVON, TSOU A. ([id number]) ?Visit Report for 10/11/2021 ?Problem List Details ?Patient Name: Date of Service: ?[name], [place]. 10/11/2021 10:00 A M ?Medical Record Number: [id number] ?Patient Account Number: [id number] ?Date of Birth/Sex: Treating RN: ?08/28/1955 (67 y.o. 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 08/25/2021 No Yes ?E11.621 Type 2 diabetes mellitus with foot ulcer 08/25/2021 No Yes ?I25.10 Atherosclerotic heart disease of native coronary artery without angina pectoris 08/25/2021 No Yes ?[id number] 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 10/11/2021 ?Medical Record Number: [id number] ?Patient Account Number: [id number] ?Date of Birth/Sex: Treating RN: ?1955/08/01 (66 y.o. 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 ?Facility Procedures ?CPT4 Code: [id number] ?Description: G0277-(Facility Use Only) HBOT full body chamber, 20min , ICD-10 Diagnosis Description E11.621 Type 2 diabetes mellitus with foot ulcer 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 M86.672 Other chronic  ?osteomyelitis, left ankle and foot 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 ?Modifier: ?Quantity: 4 ?Physician Procedures ?: CPT4 Code Description Modifier [id number] - WC PHYS HYPERBARIC OXYGEN THERAPY ICD-10 Diagnosis Description E11.621 Type 2 diabetes mellitus with foot ulcer 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

## 2021-10-11 NOTE — Progress Notes (Signed)
DESJUAN, STEARNS A. ([id number]) ?Visit Report for 10/08/2021 ?Chief Complaint Document Details ?Patient Name: Date of Service: ?NA [name] 10/08/2021 12:45 PM ?Medical Record Number: [id number] ?Patient Account Number: [id number] ?Date of Birth/Sex: Treating RN: ?04/30/1956 (66 y.o. [name] ?Primary Care Provider: [name] Other Clinician: ?Referring Provider: ?Treating Provider/Extender: [name] ?[name] ?Weeks in Treatment: 6 ?Information Obtained from: Patient ?Chief Complaint ?Patients presents for treatment of an open diabetic ulcer with exposed bone and osteomyelitis ?Electronic Signature(s) ?Signed: 10/08/2021 1:12:19 PM By: [name] MD FACS ?Entered By: [name] on 10/08/2021 13:12:19 ?-------------------------------------------------------------------------------- ?Debridement Details ?Patient Name: Date of Service: ?NA [name] 10/08/2021 12:45 PM ?Medical Record Number: [id number] ?Patient Account Number: [id number] ?Date of Birth/Sex: Treating RN: ?April 29, 1956 (66 y.o. [name] ?Primary Care Provider: [name] Other Clinician: ?Referring Provider: ?Treating Provider/Extender: [name] ?[name] ?Weeks in Treatment: 6 ?Debridement Performed for Assessment: Wound #1 Left,Medial Foot ?Performed By: Physician [name], MD ?Debridement Type: Debridement ?Severity of Tissue Pre Debridement: Fat layer exposed ?Level of Consciousness (Pre-procedure): Awake and Alert ?Pre-procedure Verification/Time Out Yes - 13:06 ?Taken: ?Start Time: 13:06 ?T Area Debrided (L x W): ?otal 5.9 (cm) x 4.3 (cm) = 25.37 (cm?) ?Tissue and other material debrided: Non-Viable, Slough, Slough ?Level: Non-Viable Tissue ?Debridement Description: Selective/Open Wound ?Instrument: Curette ?Bleeding: Minimum ?Hemostasis Achieved: Pressure ?End Time: 13:08 ?Procedural Pain: 0 ?Post Procedural Pain: 0 ?Response to Treatment: Procedure was tolerated well ?Level of  Consciousness (Post- Awake and Alert ?procedure): ?Post Debridement Measurements of Total Wound ?Length: (cm) 5.9 ?Width: (cm) 4.3 ?Depth: (cm) 1.6 ?Volume: (cm?) 31.881 ?Character of Wound/Ulcer Post Debridement: Requires Further Debridement ?Severity of Tissue Post Debridement: Fat layer exposed ?Post Procedure Diagnosis ?Same as Pre-procedure ?Electronic Signature(s) ?Signed: 10/08/2021 2:48:42 PM By: [name] MD FACS ?Signed: 10/11/2021 5:50:11 PM By: Zandra Abts RN, BSN ?Entered By: Zandra Abts on 10/08/2021 13:07:37 ?-------------------------------------------------------------------------------- ?HPI Details ?Patient Name: Date of Service: ?NA [name] 10/08/2021 12:45 PM ?Medical Record Number: [id number] ?Patient Account Number: [id number] ?Date of Birth/Sex: Treating RN: ?1955/09/26 (66 y.o. [name] ?Primary Care Provider: [name] Other Clinician: ?Referring Provider: ?Treating Provider/Extender: [name] ?[name] ?Weeks in Treatment: 6 ?History of Present Illness ?HPI Description: ADMISSION ?08/25/2021 ?This is a 66 year old man who initially presented to his primary care provider in September 2022 with pain in his left foot. He was sent for an x-ray and while ?the x-ray was being performed, the tech pointed out a wound on his foot that the patient was not aware existed. He does have type 2 diabetes with significant ?neuropathy. His diabetes is suboptimally controlled with his most recent A1c being 8.5. He also has a history of coronary artery disease status post three- ?vessel CABG. he was initially seen by orthopedics, but they referred him to [facility]. He has undergone at least 7 ?operations/debridements and several applications of skin substitute under the care of podiatry. He has been in a wound VAC for much of this time. His most ?recent procedure was July 28, 2021. A portion of the talus was biopsied and was found to be consistent with  osteomyelitis. Culture also returned positive ?for corynebacterium. He was seen on August 16, 2021 by infectious disease. A PICC line has been placed and he will be receiving a 6-week course of IV ?daptomycin and cefepime. In October 2022, he underwent lower extremity vascular studies. Results are copied here: ?Right: Resting right ankle-brachial  index is within normal range. No ?evidence of significant right lower extremity arterial disease. The right ?toe-brachial index is abnormal. ?Left: Resting left ankle-brachial index indicates mild left lower ?extremity arterial disease. The left toe-brachial index is abnormal. ?He has not been seen by vascular surgery despite these findings. ?He presented to clinic today in a cam boot and is using a knee scooter to offload. Wound VAC was in place. Once this was removed, a large ulcer was ?identified on the left midfoot/ankle. Bone is frankly exposed. There is no malodorous or purulent drainage. There is some granulation tissue over the central ?portion of the exposed bone. There is a tunnel that extends posteriorly for roughly 10 cm. ?It has been discussed with him by multiple providers that he is at very high risk of losing his lower leg because of this wound. He is extremely eager to avoid ?this outcome and is here today to review his options as well as receive ongoing wound care. ?09/03/2021: Here for reevaluation of his wound. There does not appear to have been any substantial improvement overall since our last visit. He has been in a ?wound VAC with white foam overlying the exposed bone. We are working on getting him approved for hyperbaric oxygen therapy. ?09/10/2021: We are in the process of getting him cleared to begin hyperbaric oxygen therapy. He still needs to obtain a chest x-ray. Although the wound ?measurements are roughly the same, I think the overall appearance of the wound is better. The exposed bone has a bit more granulation tissue covering it. He ?has not  received a vascular surgery appointment to reevaluate his flow to the wound. ?09/17/2021: He has been approved for hyperbaric oxygen therapy and completed his chest x-ray, which I reviewed and it appears normal. The tunnels at the 12 ?and 10:00 positions are smaller. There is more granulation tissue covering the exposed bone and the undermining has decreased. He still has not received a ?vascular surgery appointment. ?09/24/2021: He initiated hyperbaric oxygen therapy this week and is tolerating it well. He has an appointment with vascular surgery coming up on May 16. The ?granulation tissue is covering more of the exposed bone and both tunnels are a bit smaller. ?10/01/2021: He continues to tolerate hyperbaric oxygen therapy. He saw infectious disease and they are planning to pull his PICC line. He has been initiated on ?oral antibiotics (doxycycline and Augmentin). The wound looks about the same but the tunnels are a little bit smaller. The skin seems to be contracting ?somewhat around the exposed bone. ?10/08/2021: The wound is still about the same size, but the tunnels continue to come in and the skin is contracting around the exposed bone. He continues to ?have some accumulation of necrotic material in the inferoposterior aspect of the wound as well as accumulation at the 12:00 tunnel area. ?Electronic Signature(s) ?Signed: 10/08/2021 1:13:28 PM By: [name] MD FACS ?Entered By: [name] on 10/08/2021 13:13:27 ?-------------------------------------------------------------------------------- ?Physical Exam Details ?Patient Name: ?Date of Service: ?NA [name] 10/08/2021 12:45 PM ?Medical Record Number: [id number] ?Patient Account Number: [id number] ?Date of Birth/Sex: ?Treating RN: ?20-Aug-1955 (66 y.o. [name] ?Primary Care Provider: [name] ?Other Clinician: ?Referring Provider: ?Treating Provider/Extender: [name] ?[name] ?Weeks in Treatment: 6 ?Constitutional ?Slightly  hypertensive. . . . No acute distress. ?Respiratory ?Normal work of breathing on room air. ?Notes ?10/08/2021: The skin is contracting around the exposed bone. There is good granulation tissue overlying the

## 2021-10-12 ENCOUNTER — Other Ambulatory Visit: Payer: Self-pay | Admitting: *Deleted

## 2021-10-12 ENCOUNTER — Encounter (HOSPITAL_BASED_OUTPATIENT_CLINIC_OR_DEPARTMENT_OTHER): Payer: BC Managed Care – PPO | Admitting: General Surgery

## 2021-10-12 DIAGNOSIS — E1169 Type 2 diabetes mellitus with other specified complication: Secondary | ICD-10-CM | POA: Diagnosis not present

## 2021-10-12 DIAGNOSIS — E11610 Type 2 diabetes mellitus with diabetic neuropathic arthropathy: Secondary | ICD-10-CM | POA: Diagnosis not present

## 2021-10-12 DIAGNOSIS — I739 Peripheral vascular disease, unspecified: Secondary | ICD-10-CM

## 2021-10-12 DIAGNOSIS — L97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: Secondary | ICD-10-CM | POA: Diagnosis not present

## 2021-10-12 DIAGNOSIS — T8131XA Disruption of external operation (surgical) wound, not elsewhere classified, initial encounter: Secondary | ICD-10-CM | POA: Diagnosis not present

## 2021-10-12 DIAGNOSIS — Z951 Presence of aortocoronary bypass graft: Secondary | ICD-10-CM | POA: Diagnosis not present

## 2021-10-12 DIAGNOSIS — E11621 Type 2 diabetes mellitus with foot ulcer: Secondary | ICD-10-CM | POA: Diagnosis not present

## 2021-10-12 DIAGNOSIS — M86672 Other chronic osteomyelitis, left ankle and foot: Secondary | ICD-10-CM | POA: Diagnosis not present

## 2021-10-12 DIAGNOSIS — I2510 Atherosclerotic heart disease of native coronary artery without angina pectoris: Secondary | ICD-10-CM | POA: Diagnosis not present

## 2021-10-12 LAB — GLUCOSE, CAPILLARY
Glucose-Capillary: 126 mg/dL — ABNORMAL HIGH (ref 70–99)
Glucose-Capillary: 162 mg/dL — ABNORMAL HIGH (ref 70–99)
Glucose-Capillary: 181 mg/dL — ABNORMAL HIGH (ref 70–99)

## 2021-10-12 NOTE — Progress Notes (Addendum)
JAHSEH, STILLE A. ([id number]) ?Visit Report for 10/12/2021 ?Arrival Information Details ?Patient Name: Date of Service: ?[name], [place]. 10/12/2021 10:00 A M ?Medical Record Number: [id number] ?Patient Account Number: [id number] ?Date of Birth/Sex: Treating RN: ?May 16, 1956 (66 y.o. [name] ?Primary Care Presley Gora: [name] ?Other Clinician: [name] ?Referring Ebert Forrester: ?Treating Latiana Tomei/Extender: [name] ?[name] ?Weeks in Treatment: 6 ?Visit Information History Since Last Visit ?All ordered tests and consults were completed: Yes ?Patient Arrived: Knee Scooter ?Added or deleted any medications: No ?Arrival Time: 09:20 ?Any new allergies or adverse reactions: No ?Accompanied By: spouse ?Had a fall or experienced change in No ?Transfer Assistance: None ?activities of daily living that may affect ?Patient Identification Verified: Yes ?risk of falls: ?Secondary Verification Process Completed: Yes ?Signs or symptoms of abuse/neglect since last visito No ?Patient Requires Transmission-Based Precautions: No ?Hospitalized since last visit: No ?Patient Has Alerts: Yes ?Implantable device outside of the clinic excluding No ?Patient Alerts: Patient on Blood Thinner cellular tissue based products placed in the center ?since last visit: ?Pain Present Now: No ?Electronic Signature(s) ?Signed: 10/12/2021 12:29:26 PM By: Donavan Burnet CHT EMT BS ?, , ?Previous Signature: 10/12/2021 10:12:24 AM Version By: Donavan Burnet CHT EMT BS ?, , ?Entered By: Donavan Burnet on 10/12/2021 12:29:26 ?-------------------------------------------------------------------------------- ?Encounter Discharge Information Details ?Patient Name: Date of Service: ?[name], [place]. 10/12/2021 10:00 A M ?Medical Record Number: [id number] ?Patient Account Number: [id number] ?Date of Birth/Sex: Treating RN: ?1955/12/29 (66 y.o. [name] ?Primary Care Maresha Anastos: [name] ?Other Clinician: Donavan Burnet ?Referring  Fionnuala Hemmerich: ?Treating Jager Koska/Extender: [name] ?[name] ?Weeks in Treatment: 6 ?Encounter Discharge Information Items ?Discharge Condition: Stable ?Ambulatory Status: Knee Scooter ?Discharge Destination: Home ?Transportation: Private Auto ?Accompanied By: spouse ?Schedule Follow-up Appointment: No ?Clinical Summary of Care: ?Electronic Signature(s) ?Signed: 10/12/2021 12:28:53 PM By: Donavan Burnet CHT EMT BS ?, , ?Entered By: Donavan Burnet on 10/12/2021 12:28:53 ?-------------------------------------------------------------------------------- ?Vitals Details ?Patient Name: ?Date of Service: ?[name], [place]. 10/12/2021 10:00 A M ?Medical Record Number: [id number] ?Patient Account Number: [id number] ?Date of Birth/Sex: ?Treating RN: ?11-01-1955 (66 y.o. [name] ?Primary Care Dasie Chancellor: [name] ?Other Clinician: [name] ?Referring Humna Moorehouse: ?Treating Laticha Ferrucci/Extender: [name] ?[name] ?Weeks in Treatment: 6 ?Vital Signs ?Time Taken: 09:41 ?Temperature (??F): 97.8 ?Height (in): 74 ?Pulse (bpm): 88 ?Weight (lbs): 186 ?Respiratory Rate (breaths/min): 14 ?Body Mass Index (BMI): 23.9 ?Blood Pressure (mmHg): 128/84 ?Capillary Blood Glucose (mg/dl): 126 ?Reference Range: 80 - 120 mg / dl ?Electronic Signature(s) ?Signed: 10/13/2021 1:31:43 PM By: Donavan Burnet CHT EMT BS ?, , ?Entered By: Donavan Burnet on 10/12/2021 10:13:55 ?

## 2021-10-12 NOTE — Progress Notes (Signed)
GARRIS, MELHORN A. ([id number]) ?Visit Report for 10/12/2021 ?SuperBill Details ?Patient Name: Date of Service: ?[name], [name] 10/12/2021 ?Medical Record Number: [id number] ?Patient Account Number: [id number] ?Date of Birth/Sex: Treating RN: ?03/23/1956 (66 y.o. 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 ?Facility Procedures ?CPT4 Code Description Modifier Quantity ?[id number] G0277-(Facility Use Only) HBOT full body chamber, ?, 4 ?ICD-10 Diagnosis Description ?E11.621 Type 2 diabetes mellitus with foot ulcer ?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 ?Q33.354 Other chronic osteomyelitis, left ankle and foot ?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

## 2021-10-13 ENCOUNTER — Encounter (HOSPITAL_BASED_OUTPATIENT_CLINIC_OR_DEPARTMENT_OTHER): Payer: BC Managed Care – PPO | Admitting: General Surgery

## 2021-10-13 DIAGNOSIS — L97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: Secondary | ICD-10-CM | POA: Diagnosis not present

## 2021-10-13 DIAGNOSIS — Z792 Long term (current) use of antibiotics: Secondary | ICD-10-CM | POA: Diagnosis not present

## 2021-10-13 DIAGNOSIS — K219 Gastro-esophageal reflux disease without esophagitis: Secondary | ICD-10-CM | POA: Diagnosis not present

## 2021-10-13 DIAGNOSIS — Z452 Encounter for adjustment and management of vascular access device: Secondary | ICD-10-CM | POA: Diagnosis not present

## 2021-10-13 DIAGNOSIS — T8131XA Disruption of external operation (surgical) wound, not elsewhere classified, initial encounter: Secondary | ICD-10-CM | POA: Diagnosis not present

## 2021-10-13 DIAGNOSIS — E785 Hyperlipidemia, unspecified: Secondary | ICD-10-CM | POA: Diagnosis not present

## 2021-10-13 DIAGNOSIS — Z951 Presence of aortocoronary bypass graft: Secondary | ICD-10-CM | POA: Diagnosis not present

## 2021-10-13 DIAGNOSIS — Z794 Long term (current) use of insulin: Secondary | ICD-10-CM | POA: Diagnosis not present

## 2021-10-13 DIAGNOSIS — L97522 Non-pressure chronic ulcer of other part of left foot with fat layer exposed: Secondary | ICD-10-CM | POA: Diagnosis not present

## 2021-10-13 DIAGNOSIS — I2510 Atherosclerotic heart disease of native coronary artery without angina pectoris: Secondary | ICD-10-CM | POA: Diagnosis not present

## 2021-10-13 DIAGNOSIS — E1140 Type 2 diabetes mellitus with diabetic neuropathy, unspecified: Secondary | ICD-10-CM | POA: Diagnosis not present

## 2021-10-13 DIAGNOSIS — E11621 Type 2 diabetes mellitus with foot ulcer: Secondary | ICD-10-CM | POA: Diagnosis not present

## 2021-10-13 DIAGNOSIS — E1169 Type 2 diabetes mellitus with other specified complication: Secondary | ICD-10-CM | POA: Diagnosis not present

## 2021-10-13 DIAGNOSIS — I252 Old myocardial infarction: Secondary | ICD-10-CM | POA: Diagnosis not present

## 2021-10-13 DIAGNOSIS — I10 Essential (primary) hypertension: Secondary | ICD-10-CM | POA: Diagnosis not present

## 2021-10-13 DIAGNOSIS — D649 Anemia, unspecified: Secondary | ICD-10-CM | POA: Diagnosis not present

## 2021-10-13 DIAGNOSIS — M86672 Other chronic osteomyelitis, left ankle and foot: Secondary | ICD-10-CM | POA: Diagnosis not present

## 2021-10-13 DIAGNOSIS — E11610 Type 2 diabetes mellitus with diabetic neuropathic arthropathy: Secondary | ICD-10-CM | POA: Diagnosis not present

## 2021-10-13 DIAGNOSIS — Z7984 Long term (current) use of oral hypoglycemic drugs: Secondary | ICD-10-CM | POA: Diagnosis not present

## 2021-10-13 DIAGNOSIS — M1990 Unspecified osteoarthritis, unspecified site: Secondary | ICD-10-CM | POA: Diagnosis not present

## 2021-10-13 DIAGNOSIS — Z7982 Long term (current) use of aspirin: Secondary | ICD-10-CM | POA: Diagnosis not present

## 2021-10-13 LAB — GLUCOSE, CAPILLARY
Glucose-Capillary: 180 mg/dL — ABNORMAL HIGH (ref 70–99)
Glucose-Capillary: 169 mg/dL — ABNORMAL HIGH (ref 70–99)

## 2021-10-13 NOTE — Progress Notes (Addendum)
BENIAH, MAGNAN A. ([id number]) ?Visit Report for 10/13/2021 ?HBO Details ?Patient Name: Date of Service: ?[name], [name] 10/13/2021 10:00 A M ?Medical Record Number: [id number] ?Patient Account Number: [id number] ?Date of Birth/Sex: Treating RN: ?05-Jan-1956 (66 y.o. [name] ?Primary Care Guilianna Mckoy: [name] ?Other Clinician: [name] ?Referring Arya Luttrull: ?Treating Ordell Prichett/Extender: [name] ?[name] ?Weeks in Treatment: 7 ?HBO Treatment Course Details ?Treatment Course Number: 1 ?Ordering Amarisa Wilinski: [name] ?T Treatments Ordered: ?otal 40 HBO Treatment Start Date: 09/20/2021 ?HBO Indication: ?Diabetic Ulcer(s) of the Lower Extremity ?Standard/Conservative Wound Care tried and failed greater than or equal to ?30 days ?Wound #1 Left, Medial Foot ?HBO Treatment Details ?Treatment Number: 17 ?Patient Type: Outpatient ?Chamber Type: Monoplace ?Chamber Serial #: [id number] ?Treatment Protocol: 2.5 ATA with 90 minutes oxygen, with two 5 minute air breaks ?Treatment Details ?Compression Rate Down: 2.0 psi / minute ?De-Compression Rate Up: 2.0 psi / minute ?A breaks and breathing ?ir ?Compress Tx Pressure periods Decompress Decompress ?Begins Reached (leave unused spaces Begins Ends ?blank) ?Chamber Pressure (ATA 1 2.5 2.5 2.5 2.5 2.5 - - 2.5 1 ?) ?Clock Time (24 hr) 09:51 10:03 10:33 10:38 11:08 11:13 - - 11:43 11:54 ?Treatment Length: 123 (minutes) ?Treatment Segments: 4 ?Vital Signs ?Capillary Blood Glucose Reference Range: 80 - 120 mg / dl ?HBO Diabetic Blood Glucose Intervention Range: <131 mg/dl or >546 mg/dl ?Time Vitals Blood Respiratory Capillary Blood Glucose Pulse Action ?Type: ?Pulse: Temperature: ?Taken: ?Pressure: ?Rate: ?Glucose (mg/dl): ?Meter #: Oximetry (%) Taken: ?Pre 09:42 119/88 83 16 97.7 180 ?Post 11:57 156/88 75 16 97.6 169 ?Treatment Response ?Treatment Toleration: Well ?Treatment Completion Status: Treatment Completed without Adverse Event ?Treatment Notes ?I asked the  patient if he had any problems with his sinus or ears today. He said no. His treatment went well today. ?After his treatment, I asked about his sinus and ears. He stated they were OK. ?Additional Procedure Documentation ?Tissue Sevierity: Necrosis of bone ?Physician HBO Attestation: ?I certify that I supervised this HBO treatment in accordance with Medicare ?guidelines. A trained emergency response team is readily available per Yes ?hospital policies and procedures. ?Continue HBOT as ordered. Yes ?Electronic Signature(s) ?Signed: 10/13/2021 5:11:58 PM By: [name] MD FACS ?Previous Signature: 10/13/2021 2:13:20 PM Version By: [name] EMT ?Entered By: [name] on 10/13/2021 17:11:58 ?-------------------------------------------------------------------------------- ?HBO Safety Checklist Details ?Patient Name: ?Date of Service: ?[name], [name] 10/13/2021 10:00 A M ?Medical Record Number: [id number] ?Patient Account Number: [id number] ?Date of Birth/Sex: ?Treating RN: ?16-Feb-1956 (66 y.o. [name] ?Primary Care Troyce Febo: [name] ?Other Clinician: [name] ?Referring Darcee Dekker: ?Treating Andrei Mccook/Extender: [name] ?[name] ?Weeks in Treatment: 7 ?HBO Safety Checklist Items ?Safety Checklist ?Consent Form Signed ?Patient voided / foley secured and emptied ?When did you last eato 0700 ?Last dose of injectable or oral agent 0700 ?Ostomy pouch emptied and vented if applicable ?NA ?All implantable devices assessed, documented and approved ?NA ?Intravenous access site secured and place ?NA ?Valuables secured ?Linens and cotton and cotton/polyester blend (less than 51% polyester) ?Personal oil-based products / skin lotions / body lotions removed ?Wigs or hairpieces removed ?NA ?Smoking or tobacco materials removed ?Books / newspapers / magazines / loose paper removed ?Cologne, aftershave, perfume and deodorant removed ?Jewelry removed (may wrap wedding band) ?NA ?Make-up  removed ?NA ?Hair care products removed ?Battery operated devices (external) removed ?Heating patches and chemical warmers removed ?Titanium eyewear removed ?NA ?Nail polish cured greater than 10 hours ?NA ?Casting material cured greater  than 10 hours ?NA ?Hearing aids removed ?NA ?Loose dentures or partials removed ?NA ?Prosthetics have been removed ?NA ?Patient demonstrates correct use of air break device (if applicable) ?Patient concerns have been addressed ?Patient grounding bracelet on and cord attached to chamber ?Specifics for Inpatients (complete in addition to above) ?Medication sheet sent with patient ?NA ?Intravenous medications needed or due during therapy sent with patient ?NA ?Drainage tubes (e.g. nasogastric tube or chest tube secured and vented) ?NA ?Endotracheal or Tracheotomy tube secured ?NA ?Cuff deflated of air and inflated with saline ?NA ?Airway suctioned ?NA ?Notes ?Paper version used prior to treatment. ?Electronic Signature(s) ?Signed: 10/13/2021 2:06:34 PM By: [name] EMT ?Entered By: [name] on 10/13/2021 14:06:34 ?

## 2021-10-13 NOTE — Progress Notes (Signed)
[name], Colin A. ([id number]) ?Visit Report for 10/13/2021 ?Problem List Details ?Patient Name: Date of Service: ?[name], [place]. 10/13/2021 10:00 A M ?Medical Record Number: [id number] ?Patient Account Number: [id number] ?Date of Birth/Sex: Treating RN: ?August 05, 1955 (66 y.o. 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 08/25/2021 No Yes ?E11.621 Type 2 diabetes mellitus with foot ulcer 08/25/2021 No Yes ?I25.10 Atherosclerotic heart disease of native coronary artery without angina pectoris 08/25/2021 No Yes ?[id number] 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 10/13/2021 ?Medical Record Number: [id number] ?Patient Account Number: [id number] ?Date of Birth/Sex: Treating RN: ?02-Dec-1955 (66 y.o. 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 ?Facility Procedures ?CPT4 Code: [id number] ?Description: G0277-(Facility Use Only) HBOT full body chamber, 14min , ICD-10 Diagnosis Description E11.621 Type 2 diabetes mellitus with foot ulcer 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 M86.672 Other chronic  ?osteomyelitis, left ankle and foot 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 ?Modifier: ?Quantity: 4 ?Physician Procedures ?: CPT4 Code Description Modifier [id number] - WC PHYS HYPERBARIC OXYGEN THERAPY ICD-10 Diagnosis Description E11.621 Type 2 diabetes mellitus with foot ulcer 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

## 2021-10-13 NOTE — Progress Notes (Signed)
[name], Colin A. ([id number]) ?Visit Report for 10/12/2021 ?HBO Details ?Patient Name: Date of Service: ?NA RRO [name], MILTO [name] A. 10/12/2021 10:00 A M ?Medical Record Number: [id number] ?Patient Account Number: [id number] ?Date of Birth/Sex: Treating RN: ?01/24/1956 (66 y.o. [name] ?Primary Care Charlotte Fidalgo: [name] ?Other Clinician: [name] ?Referring Kathrynn Backstrom: ?Treating Tresean Mattix/Extender: [name] ?[name] ?Weeks in Treatment: 6 ?HBO Treatment Course Details ?Treatment Course Number: 1 ?Ordering Ayme Short: [name] ?T Treatments Ordered: ?otal 40 HBO Treatment Start Date: 09/20/2021 ?HBO Indication: ?Diabetic Ulcer(s) of the Lower Extremity ?Standard/Conservative Wound Care tried and failed greater than or equal to ?30 days ?Wound #1 Left, Medial Foot ?HBO Treatment Details ?Treatment Number: 16 ?Patient Type: Outpatient ?Chamber Type: Monoplace ?Chamber Serial #: [id number] ?Treatment Protocol: 2.5 ATA with 90 minutes oxygen, with two 5 minute air breaks ?Treatment Details ?Compression Rate Down: 2.0 psi / minute ?De-Compression Rate Up: 2.0 psi / minute ?A breaks and breathing ?ir ?Compress Tx Pressure periods Decompress Decompress ?Begins Reached (leave unused spaces Begins Ends ?blank) ?Chamber Pressure (ATA 1 2.5 2.5 2.5 2.5 2.5 - - 2.5 1 ?) ?Clock Time (24 hr) 10:00 10:17 10:47 10:52 11:22 11:27 - - 11:57 12:08 ?Treatment Length: 128 (minutes) ?Treatment Segments: 4 ?Vital Signs ?Capillary Blood Glucose Reference Range: 80 - 120 mg / dl ?HBO Diabetic Blood Glucose Intervention Range: <131 mg/dl or >353 mg/dl ?Type: Time Vitals Blood Pulse: Respiratory Temperature: Capillary Blood Glucose Pulse Action ?Taken: Pressure: Rate: Glucose (mg/dl): Meter #: Oximetry (%) Taken: ?Pre 09:41 128/84 88 14 97.8 126 2 8 oz Glucerna given, requested early recheck. ?Pre 09:57 162 ?Treatment Response ?Treatment Toleration: Well ?Treatment Completion Status: Treatment Completed without Adverse  Event ?Treatment Notes ?Patient's blood glucose was 126 mg/dL at 6144. Patient consumed 8 oz Glucerna. Requested to check glucose level at 15 minutes after consuming Glucerna. ?Physician approved. Blood glucose was 162 mg/dL at 3154. Patient tolerated treatment well. ?Additional Procedure Documentation ?Tissue Sevierity: Necrosis of bone ?Physician HBO Attestation: ?I certify that I supervised this HBO treatment in accordance with Medicare ?guidelines. A trained emergency response team is readily available per Yes ?hospital policies and procedures. ?Continue HBOT as ordered. Yes ?Electronic Signature(s) ?Signed: 10/12/2021 4:38:52 PM By: [name] CHT EMT BS ?, , ?Signed: 10/12/2021 4:42:51 PM By: [name] MD FACS ?Previous Signature: 10/12/2021 12:27:07 PM Version By: [name] CHT EMT BS ?, , ?Previous Signature: 10/12/2021 12:27:07 PM Version By: [name] CHT EMT BS ?, , ?Previous Signature: 10/12/2021 2:07:17 PM Version By: [name] MD FACS ?Previous Signature: 10/12/2021 12:21:16 PM Version By: [name] MD FACS ?Previous Signature: 10/12/2021 11:50:06 AM Version By: [name] CHT EMT BS ?, , ?Previous Signature: 10/12/2021 11:36:21 AM Version By: [name] CHT EMT BS ?, , ?Entered By: [name] on 10/12/2021 16:38:52 ?-------------------------------------------------------------------------------- ?HBO Safety Checklist Details ?Patient Name: ?Date of Service: ?NA RRO [name], MILTO [name] A. 10/12/2021 10:00 A M ?Medical Record Number: [id number] ?Patient Account Number: [id number] ?Date of Birth/Sex: ?Treating RN: ?Jan 21, 1956 (66 y.o. [name] ?Primary Care Amira Podolak: [name] ?Other Clinician: [name] ?Referring Addilyne Backs: ?Treating Jaydalyn Demattia/Extender: [name] ?[name] ?Weeks in Treatment: 6 ?HBO Safety Checklist Items ?Safety Checklist ?Consent Form Signed ?Patient voided / foley secured and emptied ?When did you last eato 0715 ?Last dose  of injectable or oral agent 0715 ?Ostomy pouch emptied and vented if applicable ?NA ?All implantable devices assessed, documented and approved ?NA ?Intravenous access site secured and place ?NA ?Valuables secured ?Linens and cotton  and cotton/polyester blend (less than 51% polyester) ?Personal oil-based products / skin lotions / body lotions removed ?Wigs or hairpieces removed ?NA ?Smoking or tobacco materials removed ?NA ?Books / newspapers / magazines / loose paper removed ?Cologne, aftershave, perfume and deodorant removed ?Jewelry removed (may wrap wedding band) ?Make-up removed ?NA ?Hair care products removed ?Battery operated devices (external) removed ?Heating patches and chemical warmers removed ?Titanium eyewear removed ?NA ?Nail polish cured greater than 10 hours ?NA ?Casting material cured greater than 10 hours ?NA ?Hearing aids removed ?NA ?Loose dentures or partials removed ?NA ?Prosthetics have been removed ?NA ?Patient demonstrates correct use of air break device (if applicable) ?Patient concerns have been addressed ?Patient grounding bracelet on and cord attached to chamber ?Specifics for Inpatients (complete in addition to above) ?Medication sheet sent with patient ?NA ?Intravenous medications needed or due during therapy sent with patient ?NA ?Drainage tubes (e.g. nasogastric tube or chest tube secured and vented) ?NA ?Endotracheal or Tracheotomy tube secured ?NA ?Cuff deflated of air and inflated with saline ?NA ?Airway suctioned ?NA ?Notes ?Paper version used prior to treatment. ?Electronic Signature(s) ?Signed: 10/13/2021 1:31:43 PM By: [name] CHT EMT BS ?, , ?Entered By: [name] on 10/12/2021 10:16:06 ?

## 2021-10-13 NOTE — Progress Notes (Addendum)
JAVIOUS, HALLISEY A. ([id number]) ?Visit Report for 10/13/2021 ?Arrival Information Details ?Patient Name: Date of Service: ?[name], [name] 10/13/2021 10:00 A M ?Medical Record Number: [id number] ?Patient Account Number: [id number] ?Date of Birth/Sex: Treating RN: ?[birth year] (66 y.o. [name] ?Primary Care [name]: [name] ?Other Clinician: [name] ?Referring Maddalyn Lutze: ?Treating Porsha Skilton/Extender: [name] ?[name] ?Weeks in Treatment: 7 ?Visit Information History Since Last Visit ?All ordered tests and consults were completed: Yes ?Patient Arrived: Knee Scooter ?Added or deleted any medications: No ?Arrival Time: 09:23 ?Any new allergies or adverse reactions: No ?Accompanied By: Wife ?Had a fall or experienced change in No ?Transfer Assistance: None ?activities of daily living that may affect ?Patient Identification Verified: Yes ?risk of falls: ?Secondary Verification Process Completed: Yes ?Signs or symptoms of abuse/neglect since last visito No ?Patient Requires Transmission-Based Precautions: No ?Hospitalized since last visit: No ?Patient Has Alerts: Yes ?Implantable device outside of the clinic excluding No ?Patient Alerts: Patient on Blood Thinner cellular tissue based products placed in the center ?since last visit: ?Pain Present Now: No ?Notes ?Paper version used prior to treatment. ?Electronic Signature(s) ?Signed: 10/13/2021 2:04:34 PM By: [name] EMT ?Entered By: [name] on 10/13/2021 14:04:33 ?-------------------------------------------------------------------------------- ?Encounter Discharge Information Details ?Patient Name: Date of Service: ?[name], [name] 10/13/2021 10:00 A M ?Medical Record Number: [id number] ?Patient Account Number: [id number] ?Date of Birth/Sex: Treating RN: ?1955-09-14 (66 y.o. [name] ?Primary Care Derriana Oser: [name] ?Other Clinician: [name] ?Referring Taliya Mcclard: ?Treating Daena Alper/Extender: [name] ?[name] ?Weeks in Treatment: 7 ?Encounter Discharge Information Items ?Discharge Condition: Stable ?Ambulatory Status: Knee Scooter ?Discharge Destination: Home ?Transportation: Private Auto ?Accompanied By: Wife ?Schedule Follow-up Appointment: No ?Clinical Summary of Care: ?Electronic Signature(s) ?Signed: 10/13/2021 2:16:33 PM By: [name] EMT ?Entered By: [name] on 10/13/2021 14:16:33 ?-------------------------------------------------------------------------------- ?Vitals Details ?Patient Name: ?Date of Service: ?[name], [name] 10/13/2021 10:00 A M ?Medical Record Number: [id number] ?Patient Account Number: [id number] ?Date of Birth/Sex: ?Treating RN: ?June 01, 1956 (66 y.o. [name] ?Primary Care Sherra Kimmons: [name] ?Other Clinician: [name] ?Referring Saraphina Lauderbaugh: ?Treating Siani Utke/Extender: [name] ?[name] ?Weeks in Treatment: 7 ?Vital Signs ?Time Taken: 09:42 ?Temperature (??F): 97.7 ?Height (in): 74 ?Pulse (bpm): 83 ?Weight (lbs): 186 ?Respiratory Rate (breaths/min): 16 ?Body Mass Index (BMI): 23.9 ?Blood Pressure (mmHg): 119/88 ?Capillary Blood Glucose (mg/dl): 277 ?Reference Range: 80 - 120 mg / dl ?Notes ?Paper version used prior to treatment. ?Electronic Signature(s) ?Signed: 10/13/2021 2:05:20 PM By: [name] EMT ?Entered By: [name] on 10/13/2021 14:05:19 ?

## 2021-10-14 ENCOUNTER — Encounter (HOSPITAL_BASED_OUTPATIENT_CLINIC_OR_DEPARTMENT_OTHER): Payer: BC Managed Care – PPO | Admitting: General Surgery

## 2021-10-14 DIAGNOSIS — E1169 Type 2 diabetes mellitus with other specified complication: Secondary | ICD-10-CM | POA: Diagnosis not present

## 2021-10-14 DIAGNOSIS — E11610 Type 2 diabetes mellitus with diabetic neuropathic arthropathy: Secondary | ICD-10-CM | POA: Diagnosis not present

## 2021-10-14 DIAGNOSIS — I2510 Atherosclerotic heart disease of native coronary artery without angina pectoris: Secondary | ICD-10-CM | POA: Diagnosis not present

## 2021-10-14 DIAGNOSIS — Z951 Presence of aortocoronary bypass graft: Secondary | ICD-10-CM | POA: Diagnosis not present

## 2021-10-14 DIAGNOSIS — E11621 Type 2 diabetes mellitus with foot ulcer: Secondary | ICD-10-CM | POA: Diagnosis not present

## 2021-10-14 DIAGNOSIS — T8131XA Disruption of external operation (surgical) wound, not elsewhere classified, initial encounter: Secondary | ICD-10-CM | POA: Diagnosis not present

## 2021-10-14 DIAGNOSIS — M86672 Other chronic osteomyelitis, left ankle and foot: Secondary | ICD-10-CM | POA: Diagnosis not present

## 2021-10-14 DIAGNOSIS — L97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: Secondary | ICD-10-CM | POA: Diagnosis not present

## 2021-10-14 LAB — GLUCOSE, CAPILLARY
Glucose-Capillary: 270 mg/dL — ABNORMAL HIGH (ref 70–99)
Glucose-Capillary: 235 mg/dL — ABNORMAL HIGH (ref 70–99)

## 2021-10-14 NOTE — Progress Notes (Signed)
[name], Colin A. ([id number]) ?Visit Report for 10/14/2021 ?Problem List Details ?Patient Name: Date of Service: ?[name], [name] 10/14/2021 10:00 A M ?Medical Record Number: [id number] ?Patient Account Number: [id number] ?Date of Birth/Sex: Treating RN: ?26-Jun-1955 (66 y.o. 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 08/25/2021 No Yes ?E11.621 Type 2 diabetes mellitus with foot ulcer 08/25/2021 No Yes ?I25.10 Atherosclerotic heart disease of native coronary artery without angina pectoris 08/25/2021 No Yes ?Q59.563 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 10/14/2021 ?Medical Record Number: [id number] ?Patient Account Number: [id number] ?Date of Birth/Sex: Treating RN: ?November 25, 1955 (66 y.o. 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Modifier: ?Quantity: 4 ?Physician Procedures ?: CPT4 Code Description Modifier [id number] - WC PHYS HYPERBARIC OXYGEN THERAPY ICD-10 Diagnosis Description E11.621 Type 2 diabetes mellitus with foot ulcer 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

## 2021-10-14 NOTE — Progress Notes (Addendum)
GENTLE, HOGE A. ([id number]) ?Visit Report for 10/14/2021 ?Arrival Information Details ?Patient Name: Date of Service: ?[name], [name] 10/14/2021 10:00 A M ?Medical Record Number: [id number] ?Patient Account Number: [id number] ?Date of Birth/Sex: Treating RN: ?Nov 15, 1955 (66 y.o. [name] ?Primary Care Edy Belt: [name] ?Other Clinician: [name] ?Referring Karmon Andis: ?Treating Kaylon Hitz/Extender: [name] ?[name] ?Weeks in Treatment: 7 ?Visit Information History Since Last Visit ?All ordered tests and consults were completed: Yes ?Patient Arrived: Knee Scooter ?Added or deleted any medications: No ?Arrival Time: 09:20 ?Any new allergies or adverse reactions: No ?Accompanied By: spouse ?Had a fall or experienced change in No ?Transfer Assistance: None ?activities of daily living that may affect ?Patient Identification Verified: Yes ?risk of falls: ?Secondary Verification Process Completed: Yes ?Signs or symptoms of abuse/neglect since last visito No ?Patient Requires Transmission-Based Precautions: No ?Hospitalized since last visit: No ?Patient Has Alerts: Yes ?Implantable device outside of the clinic excluding No ?Patient Alerts: Patient on Blood Thinner cellular tissue based products placed in the center ?since last visit: ?Pain Present Now: No ?Electronic Signature(s) ?Signed: 10/14/2021 11:16:09 AM By: [name] CHT EMT BS ?, , ?Entered By: [name] on 10/14/2021 11:16:09 ?-------------------------------------------------------------------------------- ?Encounter Discharge Information Details ?Patient Name: Date of Service: ?[name], [name] 10/14/2021 10:00 A M ?Medical Record Number: [id number] ?Patient Account Number: [id number] ?Date of Birth/Sex: Treating RN: ?06-21-1955 (66 y.o. [name] ?Primary Care Kelissa Merlin: [name] ?Other Clinician: [name] ?Referring Charnise Lovan: ?Treating Nysha Koplin/Extender: [name] ?[name] ?Weeks in Treatment:  7 ?Encounter Discharge Information Items ?Discharge Condition: Stable ?Ambulatory Status: Knee Scooter ?Discharge Destination: Home ?Transportation: Other ?Accompanied By: Wife ?Schedule Follow-up Appointment: No ?Clinical Summary of Care: ?Electronic Signature(s) ?Signed: 10/14/2021 12:03:04 PM By: [name] EMT ?Entered By: [name] on 10/14/2021 12:03:03 ?-------------------------------------------------------------------------------- ?Vitals Details ?Patient Name: ?Date of Service: ?[name], [name] 10/14/2021 10:00 A M ?Medical Record Number: [id number] ?Patient Account Number: [id number] ?Date of Birth/Sex: ?Treating RN: ?11/09/1955 (66 y.o. [name] ?Primary Care Tennile Styles: [name] ?Other Clinician: [name] ?Referring Gelila Well: ?Treating Vennie Salsbury/Extender: [name] ?[name] ?Weeks in Treatment: 7 ?Vital Signs ?Time Taken: 09:38 ?Temperature (??F): 97.9 ?Height (in): 74 ?Pulse (bpm): 80 ?Weight (lbs): 186 ?Respiratory Rate (breaths/min): 18 ?Body Mass Index (BMI): 23.9 ?Blood Pressure (mmHg): 128/88 ?Capillary Blood Glucose (mg/dl): 017 ?Reference Range: 80 - 120 mg / dl ?Electronic Signature(s) ?Signed: 10/14/2021 11:16:48 AM By: [name] CHT EMT BS ?, , ?Entered By: [name] on 10/14/2021 11:16:48 ?

## 2021-10-14 NOTE — Progress Notes (Addendum)
EVON, TALARICO A. ([id number]) ?Visit Report for 10/14/2021 ?HBO Details ?Patient Name: Date of Service: ?[name], [place]. 10/14/2021 10:00 A M ?Medical Record Number: [id number] ?Patient Account Number: [id number] ?Date of Birth/Sex: Treating RN: ?August 15, 1955 (66 y.o. [name] ?Primary Care Raechel Marcos: [name] ?Other Clinician: [name] ?Referring Jenese Mischke: ?Treating Taneal Sonntag/Extender: [name] ?[name] ?Weeks in Treatment: 7 ?HBO Treatment Course Details ?Treatment Course Number: 1 ?Ordering Amiee Wiley: [name] ?T Treatments Ordered: ?otal 40 HBO Treatment Start Date: 09/20/2021 ?HBO Indication: ?Diabetic Ulcer(s) of the Lower Extremity ?Standard/Conservative Wound Care tried and failed greater than or equal to ?30 days ?Wound #1 Left, Medial Foot ?HBO Treatment Details ?Treatment Number: [id number] ?Patient Type: Outpatient ?Chamber Type: Monoplace ?Chamber Serial #: [id number] ?Treatment Protocol: 2.5 ATA with 90 minutes oxygen, with two 5 minute air breaks ?Treatment Details ?Compression Rate Down: 2.0 psi / minute ?De-Compression Rate Up: 2.0 psi / minute ?A breaks and breathing ?ir ?Compress Tx Pressure periods Decompress Decompress ?Begins Reached (leave unused spaces Begins Ends ?blank) ?Chamber Pressure (ATA 1 2.5 2.5 2.5 2.5 2.5 - - 2.5 1 ?) ?Clock Time (24 hr) 09:43 10:01 10:31 10:36 11:06 11:11 - - 11:41 11:51 ?Treatment Length: 128 (minutes) ?Treatment Segments: 4 ?Vital Signs ?Capillary Blood Glucose Reference Range: 80 - 120 mg / dl ?HBO Diabetic Blood Glucose Intervention Range: <131 mg/dl or >249 mg/dl ?Time Vitals Blood Respiratory Capillary Blood Glucose Pulse Action ?Type: ?Pulse: Temperature: ?Taken: ?Pressure: ?Rate: ?Glucose (mg/dl): ?Meter #: Oximetry (%) Taken: ?Pre 09:38 128/88 80 18 97.9 270 ?Post 11:54 168/93 73 16 97.1 235 ?Treatment Response ?Treatment Toleration: Well ?Treatment Completion Status: Treatment Completed without Adverse Event ?Treatment Notes ?Patient  traveled at 1 psi/min until reaching 2 psig, travel rate increased to 2.0 psi/min. Patient tolerated travel well. M[place] ?The patient had no problems noted during his treatment today. ?Additional Procedure Documentation ?Tissue Sevierity: Necrosis of bone ?Physician HBO Attestation: ?I certify that I supervised this HBO treatment in accordance with Medicare ?guidelines. A trained emergency response team is readily available per Yes ?hospital policies and procedures. ?Continue HBOT as ordered. Yes ?Electronic Signature(s) ?Signed: 10/14/2021 12:55:52 PM By: [name] MD FACS ?Previous Signature: 10/14/2021 12:00:59 PM Version By: [name] EMT ?Previous Signature: 10/14/2021 12:00:59 PM Version By: [name] EMT ?Previous Signature: 10/14/2021 11:24:53 AM Version By: Donavan Burnet CHT EMT BS ?, , ?Previous Signature: 10/14/2021 11:24:04 AM Version By: Donavan Burnet CHT EMT BS ?, , ?Entered By: [name] on 10/14/2021 12:55:52 ?-------------------------------------------------------------------------------- ?HBO Safety Checklist Details ?Patient Name: ?Date of Service: ?[name], [place]. 10/14/2021 10:00 A M ?Medical Record Number: [id number] ?Patient Account Number: [id number] ?Date of Birth/Sex: ?Treating RN: ?April 23, 1956 (66 y.o. [name] ?Primary Care Riad Wagley: [name] ?Other Clinician: Donavan Burnet ?Referring Shaquanda Graves: ?Treating Arisa Congleton/Extender: [name] ?[name] ?Weeks in Treatment: 7 ?HBO Safety Checklist Items ?Safety Checklist ?Consent Form Signed ?Patient voided / foley secured and emptied ?When did you last eato 0700 ?Last dose of injectable or oral agent 0700 ?Ostomy pouch emptied and vented if applicable ?NA ?All implantable devices assessed, documented and approved ?NA ?Intravenous access site secured and place ?NA ?Valuables secured ?Linens and cotton and cotton/polyester blend (less than 51% polyester) ?Personal oil-based products / skin lotions /  body lotions removed ?Wigs or hairpieces removed ?NA ?Smoking or tobacco materials removed ?NA ?Books / newspapers / magazines / loose paper removed ?Cologne, aftershave, perfume and deodorant removed ?Jewelry removed (may wrap wedding band) ?Make-up removed ?NA ?Hair  care products removed ?Battery operated devices (external) removed ?Heating patches and chemical warmers removed ?Titanium eyewear removed ?NA ?Nail polish cured greater than 10 hours ?NA ?Casting material cured greater than 10 hours ?NA ?Hearing aids removed ?NA ?Loose dentures or partials removed ?NA ?Prosthetics have been removed ?NA ?Patient demonstrates correct use of air break device (if applicable) ?Patient concerns have been addressed ?Patient grounding bracelet on and cord attached to chamber ?Specifics for Inpatients (complete in addition to above) ?Medication sheet sent with patient ?NA ?Intravenous medications needed or due during therapy sent with patient ?NA ?Drainage tubes (e.g. nasogastric tube or chest tube secured and vented) ?NA ?Endotracheal or Tracheotomy tube secured ?NA ?Cuff deflated of air and inflated with saline ?NA ?Airway suctioned ?NA ?Notes ?Paper version used prior to treatment. ?Electronic Signature(s) ?Signed: 10/14/2021 11:22:10 AM By: Donavan Burnet CHT EMT BS ?, , ?Entered By: Donavan Burnet on 10/14/2021 11:22:10 ?

## 2021-10-15 ENCOUNTER — Encounter (HOSPITAL_BASED_OUTPATIENT_CLINIC_OR_DEPARTMENT_OTHER): Payer: BC Managed Care – PPO | Admitting: General Surgery

## 2021-10-15 DIAGNOSIS — E11621 Type 2 diabetes mellitus with foot ulcer: Secondary | ICD-10-CM | POA: Diagnosis not present

## 2021-10-15 DIAGNOSIS — L97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: Secondary | ICD-10-CM | POA: Diagnosis not present

## 2021-10-15 DIAGNOSIS — E11610 Type 2 diabetes mellitus with diabetic neuropathic arthropathy: Secondary | ICD-10-CM | POA: Diagnosis not present

## 2021-10-15 DIAGNOSIS — E1169 Type 2 diabetes mellitus with other specified complication: Secondary | ICD-10-CM | POA: Diagnosis not present

## 2021-10-15 DIAGNOSIS — Z951 Presence of aortocoronary bypass graft: Secondary | ICD-10-CM | POA: Diagnosis not present

## 2021-10-15 DIAGNOSIS — I2510 Atherosclerotic heart disease of native coronary artery without angina pectoris: Secondary | ICD-10-CM | POA: Diagnosis not present

## 2021-10-15 DIAGNOSIS — M86672 Other chronic osteomyelitis, left ankle and foot: Secondary | ICD-10-CM | POA: Diagnosis not present

## 2021-10-15 DIAGNOSIS — T8131XA Disruption of external operation (surgical) wound, not elsewhere classified, initial encounter: Secondary | ICD-10-CM | POA: Diagnosis not present

## 2021-10-15 LAB — GLUCOSE, CAPILLARY
Glucose-Capillary: 159 mg/dL — ABNORMAL HIGH (ref 70–99)
Glucose-Capillary: 141 mg/dL — ABNORMAL HIGH (ref 70–99)

## 2021-10-15 NOTE — Progress Notes (Addendum)
DURWARD, MATRANGA A. ([id number]) ?Visit Report for 10/15/2021 ?Arrival Information Details ?Patient Name: Date of Service: ?[name], [name] A. 10/15/2021 10:00 A M ?Medical Record Number: [id number] ?Patient Account Number: [id number] ?Date of Birth/Sex: Treating RN: ?12-26-1955 (66 y.o. M) ?Primary Care Novak Stgermaine: [name] Other Clinician: ?Referring Dmiya Malphrus: ?Treating Delvin Hedeen/Extender: [name] ?[name] ?Weeks in Treatment: 7 ?Visit Information History Since Last Visit ?All ordered tests and consults were completed: Yes ?Patient Arrived: Knee Scooter ?Added or deleted any medications: No ?Arrival Time: 09:38 ?Any new allergies or adverse reactions: No ?Accompanied By: Wife ?Had a fall or experienced change in No ?Transfer Assistance: None ?activities of daily living that may affect ?Patient Identification Verified: Yes ?risk of falls: ?Secondary Verification Process Completed: Yes ?Signs or symptoms of abuse/neglect since last visito No ?Patient Requires Transmission-Based Precautions: No ?Hospitalized since last visit: No ?Patient Has Alerts: Yes ?Implantable device outside of the clinic excluding No ?Patient Alerts: Patient on Blood Thinner cellular tissue based products placed in the center ?since last visit: ?Pain Present Now: No ?Electronic Signature(s) ?Signed: 10/15/2021 2:19:52 PM By: [name] EMT ?Entered By: [name] on 10/15/2021 14:19:52 ?-------------------------------------------------------------------------------- ?Encounter Discharge Information Details ?Patient Name: Date of Service: ?[name], [name] A. 10/15/2021 10:00 A M ?Medical Record Number: [id number] ?Patient Account Number: [id number] ?Date of Birth/Sex: Treating RN: ?May 20, 1956 (66 y.o. [name] ?Primary Care Catharina Pica: [name] ?Other Clinician: [name] ?Referring Cuauhtemoc Huegel: ?Treating Jazlene Bares/Extender: [name] ?[name] ?Weeks in Treatment: 7 ?Encounter Discharge Information Items ?Discharge  Condition: Stable ?Ambulatory Status: Knee Scooter ?Discharge Destination: Other (Note Required) ?Accompanied By: Wife ?Schedule Follow-up Appointment: Yes ?Clinical Summary of Care: ?Notes ?The patient had a wound care clinic appointment after his treatment today. ?Electronic Signature(s) ?Signed: 10/15/2021 2:48:09 PM By: [name] EMT ?Entered By: [name] on 10/15/2021 14:48:09 ?-------------------------------------------------------------------------------- ?Vitals Details ?Patient Name: ?Date of Service: ?[name], [name] A. 10/15/2021 10:00 A M ?Medical Record Number: [id number] ?Patient Account Number: [id number] ?Date of Birth/Sex: ?Treating RN: ?1956-03-22 (66 y.o. [name] ?Primary Care Camauri Craton: [name] ?Other Clinician: [name] ?Referring Jaidynn Balster: ?Treating Luster Hechler/Extender: [name] ?[name] ?Weeks in Treatment: 7 ?Vital Signs ?Time Taken: 09:49 ?Temperature (??F): 97.5 ?Height (in): 74 ?Pulse (bpm): 79 ?Weight (lbs): 186 ?Respiratory Rate (breaths/min): 16 ?Body Mass Index (BMI): 23.9 ?Blood Pressure (mmHg): 126/78 ?Capillary Blood Glucose (mg/dl): 789 ?Reference Range: 80 - 120 mg / dl ?Electronic Signature(s) ?Signed: 10/15/2021 2:30:17 PM By: [name] EMT ?Entered By: [name] on 10/15/2021 14:30:16 ?

## 2021-10-15 NOTE — Progress Notes (Signed)
[name], Colin A. ([id number]) ?Visit Report for 10/15/2021 ?Chief Complaint Document Details ?Patient Name: Date of Service: ?[name], Colin A. 10/15/2021 12:45 PM ?Medical Record Number: [id number] ?Patient Account Number: [id number] ?Date of Birth/Sex: Treating RN: ?07/27/1955 (66 y.o. [name] ?Primary Care Provider: [name] Other Clinician: ?Referring Provider: ?Treating Provider/Extender: [name] ?[name] ?Weeks in Treatment: 7 ?Information Obtained from: Patient ?Chief Complaint ?Patients presents for treatment of an open diabetic ulcer with exposed bone and osteomyelitis ?Electronic Signature(s) ?Signed: 10/15/2021 1:06:36 PM By: [name] MD FACS ?Entered By: [name] on 10/15/2021 13:06:35 ?-------------------------------------------------------------------------------- ?Debridement Details ?Patient Name: Date of Service: ?[name], Colin A. 10/15/2021 12:45 PM ?Medical Record Number: [id number] ?Patient Account Number: [id number] ?Date of Birth/Sex: Treating RN: ?12-20-1955 (66 y.o. [name] ?Primary Care Provider: [name] Other Clinician: ?Referring Provider: ?Treating Provider/Extender: [name] ?[name] ?Weeks in Treatment: 7 ?Debridement Performed for Assessment: Wound #1 Left,Medial Foot ?Performed By: Physician [name], MD ?Debridement Type: Debridement ?Severity of Tissue Pre Debridement: Fat layer exposed ?Level of Consciousness (Pre-procedure): Awake and Alert ?Pre-procedure Verification/Time Out Yes - 13:00 ?Taken: ?Start Time: 13:00 ?Pain Control: Lidocaine 4% T opical Solution ?T Area Debrided (L x W): ?otal 6 (cm) x 4 (cm) = 24 (cm?) ?Tissue and other material debrided: Viable, Non-Viable, Slough, Subcutaneous, Slough ?Level: Skin/Subcutaneous Tissue ?Debridement Description: Excisional ?Instrument: Curette ?Bleeding: Minimum ?Hemostasis Achieved: Pressure ?Procedural Pain: 0 ?Post Procedural Pain: 0 ?Response to Treatment:  Procedure was tolerated well ?Level of Consciousness (Post- Awake and Alert ?procedure): ?Post Debridement Measurements of Total Wound ?Length: (cm) 6 ?Width: (cm) 4 ?Depth: (cm) 1 ?Volume: (cm?) 18.85 ?Character of Wound/Ulcer Post Debridement: Improved ?Severity of Tissue Post Debridement: Fat layer exposed ?Post Procedure Diagnosis ?Same as Pre-procedure ?Electronic Signature(s) ?Signed: 10/15/2021 1:29:50 PM By: [name] MD FACS ?Signed: 10/15/2021 2:04:38 PM By: Adline Peals ?Entered By: Adline Peals on 10/15/2021 13:03:40 ?-------------------------------------------------------------------------------- ?HPI Details ?Patient Name: Date of Service: ?[name], Colin A. 10/15/2021 12:45 PM ?Medical Record Number: [id number] ?Patient Account Number: [id number] ?Date of Birth/Sex: Treating RN: ?February 23, 1956 (66 y.o. [name] ?Primary Care Provider: [name] Other Clinician: ?Referring Provider: ?Treating Provider/Extender: [name] ?[name] ?Weeks in Treatment: 7 ?History of Present Illness ?HPI Description: ADMISSION ?08/25/2021 ?This is a 66 year old man who initially presented to his primary care provider in September 2022 with pain in his left foot. He was sent for an x-ray and while ?the x-ray was being performed, the tech pointed out a wound on his foot that the patient was not aware existed. He does have type 2 diabetes with significant ?neuropathy. His diabetes is suboptimally controlled with his most recent A1c being 8.5. He also has a history of coronary artery disease status post three- ?vessel CABG. he was initially seen by orthopedics, but they referred him to [facility]. He has undergone at least 7 ?operations/debridements and several applications of skin substitute under the care of podiatry. He has been in a wound VAC for much of this time. His most ?recent procedure was July 28, 2021. A portion of the talus was biopsied and was found to be  consistent with osteomyelitis. Culture also returned positive ?for corynebacterium. He was seen on August 16, 2021 by infectious disease. A PICC line has been placed and he will be receiving a 6-week course of IV ?daptomycin and cefepime. In October 2022, he underwent lower extremity vascular studies. Results are copied here: ?Right: Resting right  ankle-brachial index is within normal range. No ?evidence of significant right lower extremity arterial disease. The right ?toe-brachial index is abnormal. ?Left: Resting left ankle-brachial index indicates mild left lower ?extremity arterial disease. The left toe-brachial index is abnormal. ?He has not been seen by vascular surgery despite these findings. ?He presented to clinic today in a cam boot and is using a knee scooter to offload. Wound VAC was in place. Once this was removed, a large ulcer was ?identified on the left midfoot/ankle. Bone is frankly exposed. There is no malodorous or purulent drainage. There is some granulation tissue over the central ?portion of the exposed bone. There is a tunnel that extends posteriorly for roughly 10 cm. ?It has been discussed with him by multiple providers that he is at very high risk of losing his lower leg because of this wound. He is extremely eager to avoid ?this outcome and is here today to review his options as well as receive ongoing wound care. ?09/03/2021: Here for reevaluation of his wound. There does not appear to have been any substantial improvement overall since our last visit. He has been in a ?wound VAC with white foam overlying the exposed bone. We are working on getting him approved for hyperbaric oxygen therapy. ?09/10/2021: We are in the process of getting him cleared to begin hyperbaric oxygen therapy. He still needs to obtain a chest x-ray. Although the wound ?measurements are roughly the same, I think the overall appearance of the wound is better. The exposed bone has a bit more granulation tissue covering  it. He ?has not received a vascular surgery appointment to reevaluate his flow to the wound. ?09/17/2021: He has been approved for hyperbaric oxygen therapy and completed his chest x-ray, which I reviewed and it appears normal. The tunnels at the 12 ?and 10:00 positions are smaller. There is more granulation tissue covering the exposed bone and the undermining has decreased. He still has not received a ?vascular surgery appointment. ?09/24/2021: He initiated hyperbaric oxygen therapy this week and is tolerating it well. He has an appointment with vascular surgery coming up on May 16. The ?granulation tissue is covering more of the exposed bone and both tunnels are a bit smaller. ?10/01/2021: He continues to tolerate hyperbaric oxygen therapy. He saw infectious disease and they are planning to pull his PICC line. He has been initiated on ?oral antibiotics (doxycycline and Augmentin). The wound looks about the same but the tunnels are a little bit smaller. The skin seems to be contracting ?somewhat around the exposed bone. ?10/08/2021: The wound is still about the same size, but the tunnels continue to come in and the skin is contracting around the exposed bone. He continues to ?have some accumulation of necrotic material in the inferoposterior aspect of the wound as well as accumulation at the 12:00 tunnel area. ?10/15/2021: The wound is smaller today. The tunnels continue to come in. There is less necrotic tissue present. He does have some periwound maceration. ?Electronic Signature(s) ?Signed: 10/15/2021 1:07:19 PM By: [name] MD FACS ?Entered By: [name] on 10/15/2021 13:07:18 ?-------------------------------------------------------------------------------- ?Physical Exam Details ?Patient Name: Date of Service: ?[name], Colin A. 10/15/2021 12:45 PM ?Medical Record Number: [id number] ?Patient Account Number: [id number] ?Date of Birth/Sex: Treating RN: ?[birth year] (66 y.o. [name] ?Primary Care  Provider: [name] Other Clinician: ?Referring Provider: ?Treating Provider/Extender: [name] ?[name] ?Weeks in Treatment: 7 ?Constitutional ?. . . . No acute distress. ?Respiratory ?Normal work of b

## 2021-10-15 NOTE — Progress Notes (Addendum)
ROBBIN, ESCHER A. ([id number]) ?Visit Report for 10/15/2021 ?HBO Details ?Patient Name: Date of Service: ?[name], [name] 10/15/2021 10:00 A M ?Medical Record Number: [id number] ?Patient Account Number: [id number] ?Date of Birth/Sex: Treating RN: ?[birth year] (66 y.o. [name] ?Primary Care Odeth Bry: [name] ?Other Clinician: [name] ?Referring Marybell Robards: ?Treating Neka Bise/Extender: [name] ?[name] ?Weeks in Treatment: 7 ?HBO Treatment Course Details ?Treatment Course Number: 1 ?Ordering Balinda Heacock: [name] ?T Treatments Ordered: ?otal 40 HBO Treatment Start Date: 09/20/2021 ?HBO Indication: ?Diabetic Ulcer(s) of the Lower Extremity ?Standard/Conservative Wound Care tried and failed greater than or equal to ?30 days ?Wound #1 Left, Medial Foot ?HBO Treatment Details ?Treatment Number: 19 ?Patient Type: Outpatient ?Chamber Type: Monoplace ?Chamber Serial #: [id number] ?Treatment Protocol: 2.5 ATA with 90 minutes oxygen, with two 5 minute air breaks ?Treatment Details ?Compression Rate Down: 2.0 psi / minute ?De-Compression Rate Up: 2.0 psi / minute ?A breaks and breathing ?ir ?Compress Tx Pressure periods Decompress Decompress ?Begins Reached (leave unused spaces Begins Ends ?blank) ?Chamber Pressure (ATA 1 2.5 2.5 2.5 2.5 2.5 - - 2.5 1 ?) ?Clock Time (24 hr) 09:56 10:11 10:41 10:46 11:16 11:24 - - 11:51 12:11 ?Treatment Length: 135 (minutes) ?Treatment Segments: 4 ?Vital Signs ?Capillary Blood Glucose Reference Range: 80 - 120 mg / dl ?HBO Diabetic Blood Glucose Intervention Range: <131 mg/dl or >299 mg/dl ?Time Vitals Blood Respiratory Capillary Blood Glucose Pulse Action ?Type: ?Pulse: Temperature: ?Taken: ?Pressure: ?Rate: ?Glucose (mg/dl): ?Meter #: Oximetry (%) Taken: ?Pre 09:49 126/78 79 16 97.5 159 ?Post 12:20 128/89 80 18 97.9 146 ?Treatment Response ?Treatment Toleration: Well ?Treatment Completion Status: Treatment Completed without Adverse Event ?Additional Procedure  Documentation ?Tissue Sevierity: Necrosis of bone ?Electronic Signature(s) ?Signed: 10/15/2021 2:45:28 PM By: [name] EMT ?Signed: 10/18/2021 7:29:22 AM By: [name] MD FACS ?Entered By: [name] on 10/15/2021 14:45:28 ?-------------------------------------------------------------------------------- ?HBO Safety Checklist Details ?Patient Name: ?Date of Service: ?[name], [name] 10/15/2021 10:00 A M ?Medical Record Number: [id number] ?Patient Account Number: [id number] ?Date of Birth/Sex: ?Treating RN: ?[birth year] (66 y.o. [name] ?Primary Care [name] [name]: [name] ?Other Clinician: [name] ?Referring Lema Heinkel: ?Treating Shoua Ressler/Extender: [name] ?[name] ?Weeks in Treatment: 7 ?HBO Safety Checklist Items ?Safety Checklist ?Consent Form Signed ?Patient voided / foley secured and emptied ?When did you last eato 0730 ?Last dose of injectable or oral agent 0730 ?Ostomy pouch emptied and vented if applicable ?NA ?All implantable devices assessed, documented and approved ?NA ?Intravenous access site secured and place ?NA ?Valuables secured ?Linens and cotton and cotton/polyester blend (less than 51% polyester) ?Personal oil-based products / skin lotions / body lotions removed ?Wigs or hairpieces removed ?NA ?Smoking or tobacco materials removed ?Books / newspapers / magazines / loose paper removed ?Cologne, aftershave, perfume and deodorant removed ?Jewelry removed (may wrap wedding band) ?NA ?Make-up removed ?NA ?Hair care products removed ?Battery operated devices (external) removed ?Heating patches and chemical warmers removed ?Titanium eyewear removed ?NA ?Nail polish cured greater than 10 hours ?NA ?Casting material cured greater than 10 hours ?NA ?Hearing aids removed ?NA ?Loose dentures or partials removed ?NA ?Prosthetics have been removed ?NA ?Patient demonstrates correct use of air break device (if applicable) ?Patient concerns have been addressed ?Patient grounding  bracelet on and cord attached to chamber ?Specifics for Inpatients (complete in addition to above) ?Medication sheet sent with patient ?NA ?Intravenous medications needed or due during therapy sent with patient ?NA ?Drainage tubes (e.g. nasogastric tube or chest tube secured  and vented) ?NA ?Endotracheal or Tracheotomy tube secured ?NA ?Cuff deflated of air and inflated with saline ?NA ?Airway suctioned ?NA ?Notes ?Paper version used prior to treatment. A safety checklist was done before starting treatment. ?Electronic Signature(s) ?Signed: 10/15/2021 2:32:20 PM By: [name] EMT ?Entered By: [name] on 10/15/2021 14:32:20 ?

## 2021-10-16 DIAGNOSIS — T8131XA Disruption of external operation (surgical) wound, not elsewhere classified, initial encounter: Secondary | ICD-10-CM | POA: Diagnosis not present

## 2021-10-17 DIAGNOSIS — T8131XA Disruption of external operation (surgical) wound, not elsewhere classified, initial encounter: Secondary | ICD-10-CM | POA: Diagnosis not present

## 2021-10-18 ENCOUNTER — Encounter (HOSPITAL_BASED_OUTPATIENT_CLINIC_OR_DEPARTMENT_OTHER): Payer: BC Managed Care – PPO | Admitting: General Surgery

## 2021-10-18 DIAGNOSIS — Z951 Presence of aortocoronary bypass graft: Secondary | ICD-10-CM | POA: Diagnosis not present

## 2021-10-18 DIAGNOSIS — E11610 Type 2 diabetes mellitus with diabetic neuropathic arthropathy: Secondary | ICD-10-CM | POA: Diagnosis not present

## 2021-10-18 DIAGNOSIS — Z452 Encounter for adjustment and management of vascular access device: Secondary | ICD-10-CM | POA: Diagnosis not present

## 2021-10-18 DIAGNOSIS — E785 Hyperlipidemia, unspecified: Secondary | ICD-10-CM | POA: Diagnosis not present

## 2021-10-18 DIAGNOSIS — E1169 Type 2 diabetes mellitus with other specified complication: Secondary | ICD-10-CM | POA: Diagnosis not present

## 2021-10-18 DIAGNOSIS — Z7982 Long term (current) use of aspirin: Secondary | ICD-10-CM | POA: Diagnosis not present

## 2021-10-18 DIAGNOSIS — L97522 Non-pressure chronic ulcer of other part of left foot with fat layer exposed: Secondary | ICD-10-CM | POA: Diagnosis not present

## 2021-10-18 DIAGNOSIS — E1140 Type 2 diabetes mellitus with diabetic neuropathy, unspecified: Secondary | ICD-10-CM | POA: Diagnosis not present

## 2021-10-18 DIAGNOSIS — Z792 Long term (current) use of antibiotics: Secondary | ICD-10-CM | POA: Diagnosis not present

## 2021-10-18 DIAGNOSIS — L97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: Secondary | ICD-10-CM | POA: Diagnosis not present

## 2021-10-18 DIAGNOSIS — Z794 Long term (current) use of insulin: Secondary | ICD-10-CM | POA: Diagnosis not present

## 2021-10-18 DIAGNOSIS — K219 Gastro-esophageal reflux disease without esophagitis: Secondary | ICD-10-CM | POA: Diagnosis not present

## 2021-10-18 DIAGNOSIS — I10 Essential (primary) hypertension: Secondary | ICD-10-CM | POA: Diagnosis not present

## 2021-10-18 DIAGNOSIS — Z7984 Long term (current) use of oral hypoglycemic drugs: Secondary | ICD-10-CM | POA: Diagnosis not present

## 2021-10-18 DIAGNOSIS — T8131XA Disruption of external operation (surgical) wound, not elsewhere classified, initial encounter: Secondary | ICD-10-CM | POA: Diagnosis not present

## 2021-10-18 DIAGNOSIS — I2510 Atherosclerotic heart disease of native coronary artery without angina pectoris: Secondary | ICD-10-CM | POA: Diagnosis not present

## 2021-10-18 DIAGNOSIS — D649 Anemia, unspecified: Secondary | ICD-10-CM | POA: Diagnosis not present

## 2021-10-18 DIAGNOSIS — M1990 Unspecified osteoarthritis, unspecified site: Secondary | ICD-10-CM | POA: Diagnosis not present

## 2021-10-18 DIAGNOSIS — M86672 Other chronic osteomyelitis, left ankle and foot: Secondary | ICD-10-CM | POA: Diagnosis not present

## 2021-10-18 DIAGNOSIS — E11621 Type 2 diabetes mellitus with foot ulcer: Secondary | ICD-10-CM | POA: Diagnosis not present

## 2021-10-18 DIAGNOSIS — I252 Old myocardial infarction: Secondary | ICD-10-CM | POA: Diagnosis not present

## 2021-10-18 LAB — GLUCOSE, CAPILLARY
Glucose-Capillary: 288 mg/dL — ABNORMAL HIGH (ref 70–99)
Glucose-Capillary: 251 mg/dL — ABNORMAL HIGH (ref 70–99)

## 2021-10-18 NOTE — Progress Notes (Signed)
MAR, WALMER A. ([id number]) ?Visit Report for 10/15/2021 ?Arrival Information Details ?Patient Name: Date of Service: ?NA [name] 10/15/2021 12:45 PM ?Medical Record Number: [id number] ?Patient Account Number: [id number] ?Date of Birth/Sex: Treating RN: ?[birth year] (66 y.o. [name] ?Primary Care Adan Beal: [name] Other Clinician: ?Referring Johnmark Geiger: ?Treating Ellsie Violette/Extender: [name] ?[name] ?Weeks in Treatment: 7 ?Visit Information History Since Last Visit ?Added or deleted any medications: No ?Patient Arrived: Knee Scooter ?Any new allergies or adverse reactions: No ?Arrival Time: 12:45 ?Had a fall or experienced change in No ?Accompanied By: self ?activities of daily living that may affect ?Transfer Assistance: None ?risk of falls: ?Patient Identification Verified: Yes ?Signs or symptoms of abuse/neglect since last visito No ?Secondary Verification Process Completed: Yes ?Hospitalized since last visit: No ?Patient Requires Transmission-Based Precautions: No ?Implantable device outside of the clinic excluding No ?Patient Has Alerts: Yes ?cellular tissue based products placed in the center ?Patient Alerts: Patient on Blood Thinner since last visit: ?Has Dressing in Place as Prescribed: Yes ?Pain Present Now: No ?Electronic Signature(s) ?Signed: 10/18/2021 1:54:41 PM By: Karl Ito ?Entered By: Karl Ito on 10/15/2021 12:45:28 ?-------------------------------------------------------------------------------- ?Encounter Discharge Information Details ?Patient Name: Date of Service: ?NA [name] 10/15/2021 12:45 PM ?Medical Record Number: [id number] ?Patient Account Number: [id number] ?Date of Birth/Sex: Treating RN: ?[birth year] (66 y.o. [name] ?Primary Care Riah Kehoe: [name] Other Clinician: ?Referring Fields Oros: ?Treating Latonga Ponder/Extender: [name] ?[name] ?Weeks in Treatment: 7 ?Encounter Discharge Information Items Post Procedure  Vitals ?Discharge Condition: Stable ?Temperature (F): 97.9 ?Ambulatory Status: Knee Scooter ?Pulse (bpm): 80 ?Discharge Destination: Home ?Respiratory Rate (breaths/min): 18 ?Transportation: Private Auto ?Blood Pressure (mmHg): 128/89 ?Accompanied By: self ?Schedule Follow-up Appointment: Yes ?Clinical Summary of Care: Patient Declined ?Electronic Signature(s) ?Signed: 10/15/2021 2:04:38 PM By: Samuella Bruin ?Entered By: Samuella Bruin on 10/15/2021 13:53:21 ?-------------------------------------------------------------------------------- ?Lower Extremity Assessment Details ?Patient Name: ?Date of Service: ?NA [name] 10/15/2021 12:45 PM ?Medical Record Number: [id number] ?Patient Account Number: [id number] ?Date of Birth/Sex: ?Treating RN: ?1956-04-26 (66 y.o. [name] ?Primary Care Ryleigh Esqueda: [name] ?Other Clinician: ?Referring Rubee Vega: ?Treating Alis Sawchuk/Extender: [name] ?[name] ?Weeks in Treatment: 7 ?Edema Assessment ?Assessed: [Left: No] [Right: No] ?Edema: [Left: Ye] [Right: s] ?Calf ?Left: Right: ?Point of Measurement: From Medial Instep 39.4 cm ?Ankle ?Left: Right: ?Point of Measurement: From Medial Instep 23.3 cm ?Vascular Assessment ?Pulses: ?Dorsalis Pedis ?Palpable: [Left:Yes] ?Electronic Signature(s) ?Signed: 10/15/2021 1:22:09 PM By: Zandra Abts RN, BSN ?Signed: 10/18/2021 1:54:41 PM By: Karl Ito ?Entered By: Karl Ito on 10/15/2021 12:51:36 ?-------------------------------------------------------------------------------- ?Multi Wound Chart Details ?Patient Name: ?Date of Service: ?NA [name] 10/15/2021 12:45 PM ?Medical Record Number: [id number] ?Patient Account Number: [id number] ?Date of Birth/Sex: ?Treating RN: ?07-Jan-1956 (66 y.o. [name] ?Primary Care Tydus Sanmiguel: [name] ?Other Clinician: ?Referring Miller Limehouse: ?Treating Karry Causer/Extender: [name] ?[name] ?Weeks in Treatment: 7 ?Vital Signs ?Height(in): 74 ?Capillary  Blood Glucose(mg/dl): 867 ?Weight(lbs): 186 ?Pulse(bpm): 80 ?Body Mass Index(BMI): 23.9 ?Blood Pressure(mmHg): 128/89 ?Temperature(??F): 97.9 ?Respiratory Rate(breaths/min): 18 ?Photos: [1:No Photos Left, Medial Foot] [N/A:N/A N/A] ?Wound Location: [1:Gradually Appeared] [N/A:N/A] ?Wounding Event: [1:Diabetic Wound/Ulcer of the Lower] [N/A:N/A] ?Primary Etiology: [1:Extremity Cataracts, Coronary Artery Disease,] [N/A:N/A] ?Comorbid History: [1:Hypertension, Myocardial Infarction, Peripheral Arterial Disease, Type II Diabetes, Osteomyelitis, Neuropathy 02/22/2021] [N/A:N/A] ?Date Acquired: [1:7] [N/A:N/A] ?Weeks of Treatment: [1:Open] [N/A:N/A] ?Wound Status: [1:No] [N/A:N/A] ?Wound Recurrence: [1:6x4x1] [N/A:N/A] ?Measurements L x W x D (cm) [1:18.85] [N/A:N/A] ?A (cm?) : ?rea [1:18.85] [N/A:N/A] ?Volume (  cm?) : [1:-33.30%] [N/A:N/A] ?% Reduction in A rea: [1:-33.30%] [N/A:N/A] ?% Reduction in Volume: [1:3] ?Starting Position 1 (o'clock): [1:4] ?Ending Position 1 (o'clock): [1:1] ?Maximum Distance 1 (cm): [1:Yes] [N/A:N/A] ?Undermining: [1:Grade 3] [N/A:N/A] ?Classification: [1:Medium] [N/A:N/A] ?Exudate A mount: [1:Serosanguineous] [N/A:N/A] ?Exudate Type: [1:red, brown] [N/A:N/A] ?Exudate Color: [1:Distinct, outline attached] [N/A:N/A] ?Wound Margin: [1:Large (67-100%)] [N/A:N/A] ?Granulation A mount: [1:Red, Pink, Hyper-granulation] [N/A:N/A] ?Granulation Quality: [1:Small (1-33%)] [N/A:N/A] ?Necrotic A mount: ?[1:Fat Layer (Subcutaneous Tissue): Yes N/A] ?Exposed Structures: ?[1:Bone: Yes Fascia: No Tendon: No Muscle: No Joint: No Small (1-33%)] [N/A:N/A] ?Epithelialization: [1:Debridement - Excisional] [N/A:N/A] ?Debridement: ?Pre-procedure Verification/Time Out 13:00 [N/A:N/A] ?Taken: [1:Lidocaine 4% Topical Solution] [N/A:N/A] ?Pain Control: [1:Subcutaneous, Slough] [N/A:N/A] ?Tissue Debrided: [1:Skin/Subcutaneous Tissue] [N/A:N/A] ?Level: [1:24] [N/A:N/A] ?Debridement A (sq cm): [1:rea Curette]  [N/A:N/A] ?Instrument: [1:Minimum] [N/A:N/A] ?Bleeding: [1:Pressure] [N/A:N/A] ?Hemostasis A chieved: [1:0] [N/A:N/A] ?Procedural Pain: [1:0] [N/A:N/A] ?Post Procedural Pain: [1:Procedure was tolerated well] [N/A:N/A] ?Debridement Treatment Response: [1:6x4x1] [N/A:N/A] ?Post Debridement Measurements L x ?W x D (cm) [1:18.85] [N/A:N/A] ?Post Debridement Volume: (cm?) [1:Debridement] [N/A:N/A] ?Treatment Notes ?Electronic Signature(s) ?Signed: 10/15/2021 1:06:26 PM By: [name] MD FACS ?Signed: 10/15/2021 1:22:09 PM By: Zandra Abts RN, BSN ?Entered By: [name] on 10/15/2021 13:06:26 ?-------------------------------------------------------------------------------- ?Multi-Disciplinary Care Plan Details ?Patient Name: ?Date of Service: ?NA [name] 10/15/2021 12:45 PM ?Medical Record Number: [id number] ?Patient Account Number: [id number] ?Date of Birth/Sex: ?Treating RN: ?1956/06/05 (66 y.o. [name] ?Primary Care Jelena Malicoat: [name] ?Other Clinician: ?Referring Shaka Cardin: ?Treating Sharlyn Odonnel/Extender: [name] ?[name] ?Weeks in Treatment: 7 ?Multidisciplinary Care Plan reviewed with physician ?Active Inactive ?HBO ?Nursing Diagnoses: ?Anxiety related to feelings of confinement associated with the hyperbaric oxygen chamber ?Anxiety related to knowledge deficit of hyperbaric oxygen therapy and treatment procedures ?Discomfort related to temperature and humidity changes inside hyperbaric chamber ?Potential for barotraumas to ears, sinuses, teeth, and lungs or cerebral gas embolism related to changes in atmospheric pressure inside hyperbaric oxygen ?chamber ?Potential for oxygen toxicity seizures related to delivery of 100% oxygen at an increased atmospheric pressure ?Potential for pulmonary oxygen toxicity related to delivery of 100% oxygen at an increased atmospheric pressure ?Goals: ?Barotrauma will be prevented during HBO2 ?Date Initiated: 09/17/2021 ?T arget Resolution Date:  11/12/2021 ?Goal Status: Active ?Patient will tolerate the hyperbaric oxygen therapy treatment ?Date Initiated: 09/17/2021 ?T arget Resolution Date: 11/12/2021 ?Goal Status: Active ?Patient will tolerate the internal climate of

## 2021-10-18 NOTE — Progress Notes (Signed)
[name], Colin A. ([id number]) ?Visit Report for 10/18/2021 ?SuperBill Details ?Patient Name: Date of Service: ?[name], [name] 10/18/2021 ?Medical Record Number: [id number] ?Patient Account Number: [id number] ?Date of Birth/Sex: Treating RN: ?Apr 25, 1956 (66 y.o. 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 ?Facility Procedures ?CPT4 Code Description Modifier Quantity ?[id number] G0277-(Facility Use Only) HBOT full body chamber, ?, 4 ?ICD-10 Diagnosis Description ?E11.621 Type 2 diabetes mellitus with foot ulcer ?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 ?O87.867 Other chronic osteomyelitis, left ankle and foot ?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

## 2021-10-18 NOTE — Progress Notes (Signed)
[name], Colin A. ([id number]) ?Visit Report for 10/15/2021 ?Problem List Details ?Patient Name: Date of Service: ?[name], [name] A. 10/15/2021 10:00 A M ?Medical Record Number: [id number] ?Patient Account Number: [id number] ?Date of Birth/Sex: Treating RN: ?1956-04-23 (66 y.o. 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 08/25/2021 No Yes ?E11.621 Type 2 diabetes mellitus with foot ulcer 08/25/2021 No Yes ?I25.10 Atherosclerotic heart disease of native coronary artery without angina pectoris 08/25/2021 No Yes ?Q33.354 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 10/15/2021 ?Medical Record Number: [id number] ?Patient Account Number: [id number] ?Date of Birth/Sex: Treating RN: ?21-Dec-1955 (66 y.o. 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Modifier: ?Quantity: 4 ?Physician Procedures ?: CPT4 Code Description Modifier [id number] [phone] - WC PHYS HYPERBARIC OXYGEN THERAPY ICD-10 Diagnosis Description E11.621 Type 2 diabetes mellitus with foot ulcer 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

## 2021-10-18 NOTE — Progress Notes (Addendum)
EMORI, KAMAU A. ([id number]) ?Visit Report for 10/18/2021 ?HBO Details ?Patient Name: Date of Service: ?[name], [name] 10/18/2021 10:00 A M ?Medical Record Number: [id number] ?Patient Account Number: [id number] ?Date of Birth/Sex: Treating RN: ?06/06/1956 (66 y.o. [name] ?Primary Care Crucita Lacorte: [name] ?Other Clinician: [name] ?Referring Colter Magowan: ?Treating Kesley Gaffey/Extender: [name] ?[name] ?Weeks in Treatment: 7 ?HBO Treatment Course Details ?Treatment Course Number: 1 ?Ordering Sarah-Jane Nazario: [name] ?T Treatments Ordered: ?otal 40 HBO Treatment Start Date: 09/20/2021 ?HBO Indication: ?Diabetic Ulcer(s) of the Lower Extremity ?Standard/Conservative Wound Care tried and failed greater than or equal to ?30 days ?Wound #1 Left, Medial Foot ?HBO Treatment Details ?Treatment Number: 20 ?Patient Type: Outpatient ?Chamber Type: Monoplace ?Chamber Serial #: [id number] ?Treatment Protocol: 2.5 ATA with 90 minutes oxygen, with two 5 minute air breaks ?Treatment Details ?Compression Rate Down: 2.0 psi / minute ?De-Compression Rate Up: 2.0 psi / minute ?A breaks and breathing ?ir ?Compress Tx Pressure periods Decompress Decompress ?Begins Reached (leave unused spaces Begins Ends ?blank) ?Chamber Pressure (ATA 1 2.5 2.5 2.5 2.5 2.5 - - 2.5 1 ?) ?Clock Time (24 hr) 09:35 09:49 10:19 10:24 10:54 10:59 - - 11:29 11:42 ?Treatment Length: 127 (minutes) ?Treatment Segments: 4 ?Vital Signs ?Capillary Blood Glucose Reference Range: 80 - 120 mg / dl ?HBO Diabetic Blood Glucose Intervention Range: <131 mg/dl or >832 mg/dl ?Time Vitals Blood Respiratory Capillary Blood Glucose Pulse Action ?Type: ?Pulse: Temperature: ?Taken: ?Pressure: ?Rate: ?Glucose (mg/dl): ?Meter #: Oximetry (%) Taken: ?Pre 09:32 142/79 80 16 97.7 288 2 ?Post 11:48 168/93 75 16 97.8 251 2 ?Treatment Response ?Treatment Toleration: Well ?Treatment Completion Status: Treatment Completed without Adverse Event ?Additional Procedure  Documentation ?Tissue Sevierity: Necrosis of bone ?Physician HBO Attestation: ?I certify that I supervised this HBO treatment in accordance with Medicare ?guidelines. A trained emergency response team is readily available per Yes ?hospital policies and procedures. ?Continue HBOT as ordered. Yes ?Electronic Signature(s) ?Signed: 10/18/2021 3:56:27 PM By: [name] MD FACS ?Previous Signature: 10/18/2021 2:35:16 PM Version By: [name] CHT EMT BS ?, , ?Entered By: [name] on 10/18/2021 15:56:27 ?-------------------------------------------------------------------------------- ?HBO Safety Checklist Details ?Patient Name: ?Date of Service: ?[name], [name] 10/18/2021 10:00 A M ?Medical Record Number: [id number] ?Patient Account Number: [id number] ?Date of Birth/Sex: ?Treating RN: ?1955-10-03 (66 y.o. [name] ?Primary Care Nirvi Boehler: [name] ?Other Clinician: [name] ?Referring Reyaansh Merlo: ?Treating Dhara Schepp/Extender: [name] ?[name] ?Weeks in Treatment: 7 ?HBO Safety Checklist Items ?Safety Checklist ?Consent Form Signed ?Patient voided / foley secured and emptied ?When did you last eato 0700 ?Last dose of injectable or oral agent 0700 ?Ostomy pouch emptied and vented if applicable ?NA ?All implantable devices assessed, documented and approved ?NA ?Intravenous access site secured and place ?NA ?Valuables secured ?Linens and cotton and cotton/polyester blend (less than 51% polyester) ?Personal oil-based products / skin lotions / body lotions removed ?Wigs or hairpieces removed ?NA ?Smoking or tobacco materials removed ?NA ?Books / newspapers / magazines / loose paper removed ?Cologne, aftershave, perfume and deodorant removed ?Jewelry removed (may wrap wedding band) ?Make-up removed ?NA ?Hair care products removed ?Battery operated devices (external) removed ?Heating patches and chemical warmers removed ?Titanium eyewear removed ?NA ?Nail polish cured greater than 10  hours ?NA ?Casting material cured greater than 10 hours ?NA ?Hearing aids removed ?NA ?Loose dentures or partials removed ?NA ?Prosthetics have been removed ?NA ?Patient demonstrates correct use of air break device (if applicable) ?Patient concerns have been addressed ?  Patient grounding bracelet on and cord attached to chamber ?Specifics for Inpatients (complete in addition to above) ?Medication sheet sent with patient ?NA ?Intravenous medications needed or due during therapy sent with patient ?NA ?Drainage tubes (e.g. nasogastric tube or chest tube secured and vented) ?NA ?Endotracheal or Tracheotomy tube secured ?NA ?Cuff deflated of air and inflated with saline ?NA ?Airway suctioned ?NA ?Notes ?Paper version used prior to treatment. ?Electronic Signature(s) ?Signed: 10/18/2021 2:30:57 PM By: [name] CHT EMT BS ?, , ?Entered By: [name] on 10/18/2021 14:30:56 ?

## 2021-10-18 NOTE — Progress Notes (Addendum)
[name], [name] Colin. ([id number]) ?Visit Report for 10/18/2021 ?Arrival Information Details ?Patient Name: Date of Service: ?Colin Colin, Colin Colin. 10/18/2021 10:00 Colin Colin ?Medical Record Number: [id number] ?Patient Account Number: [id number] ?Date of Birth/Sex: Treating RN: ?[birth year] (66 y.o. Colin Colin ?Primary Care Kalyn Dimattia: Colin Colin ?Other Clinician: [name] ?Referring Jahzaria Vary: ?Treating Nadege Carriger/Extender: [name] ?Colin Colin ?Weeks in Treatment: 7 ?Visit Information History Since Last Visit ?All ordered tests and consults were completed: Yes ?Patient Arrived: Knee Scooter ?Added or deleted any medications: No ?Arrival Time: 09:17 ?Any new allergies or adverse reactions: No ?Accompanied By: self ?Had Colin fall or experienced change in No ?Transfer Assistance: None ?activities of daily living that may affect ?Patient Identification Verified: Yes ?risk of falls: ?Secondary Verification Process Completed: Yes ?Signs or symptoms of abuse/neglect since last visito No ?Patient Requires Transmission-Based Precautions: No ?Hospitalized since last visit: No ?Patient Has Alerts: Yes ?Implantable device outside of the clinic excluding No ?Patient Alerts: Patient on Blood Thinner cellular tissue based products placed in the center ?since last visit: ?Pain Present Now: No ?Electronic Signature(s) ?Signed: 10/18/2021 2:28:53 PM By: [name] CHT EMT BS ?, , ?Entered By: [name] on 10/18/2021 14:28:53 ?-------------------------------------------------------------------------------- ?Encounter Discharge Information Details ?Patient Name: Date of Service: ?Colin Colin, Colin Colin. 10/18/2021 10:00 Colin Colin ?Medical Record Number: [id number] ?Patient Account Number: [id number] ?Date of Birth/Sex: Treating RN: ?December 23, 1955 (66 y.o. Colin Colin ?Primary Care Kasheena Sambrano: Colin Colin ?Other Clinician: [name] ?Referring Jama Mcmiller: ?Treating Harini Dearmond/Extender: [name] ?Colin Colin ?Weeks in Treatment:  7 ?Encounter Discharge Information Items ?Discharge Condition: Stable ?Ambulatory Status: Knee Scooter ?Discharge Destination: Home ?Transportation: Private Auto ?Accompanied By: self ?Schedule Follow-up Appointment: No ?Clinical Summary of Care: ?Electronic Signature(s) ?Signed: 10/18/2021 2:36:15 PM By: [name] CHT EMT BS ?, , ?Entered By: [name] on 10/18/2021 14:36:14 ?-------------------------------------------------------------------------------- ?Vitals Details ?Patient Name: ?Date of Service: ?Colin Colin, Colin Colin. 10/18/2021 10:00 Colin Colin ?Medical Record Number: [id number] ?Patient Account Number: [id number] ?Date of Birth/Sex: ?Treating RN: ?Feb 20, 1956 (66 y.o. Colin Colin ?Primary Care Lajune Perine: Colin Colin ?Other Clinician: [name] ?Referring Darien Mignogna: ?Treating Leaann Nevils/Extender: [name] ?Colin Colin ?Weeks in Treatment: 7 ?Vital Signs ?Time Taken: 09:32 ?Temperature (??F): 97.7 ?Height (in): 74 ?Pulse (bpm): 80 ?Weight (lbs): 186 ?Respiratory Rate (breaths/min): 16 ?Body Mass Index (BMI): 23.9 ?Blood Pressure (mmHg): 142/79 ?Capillary Blood Glucose (mg/dl): 446 ?Reference Range: 80 - 120 mg / dl ?Electronic Signature(s) ?Signed: 10/18/2021 2:29:33 PM By: [name] CHT EMT BS ?, , ?Entered By: [name] on 10/18/2021 14:29:33 ?

## 2021-10-19 ENCOUNTER — Encounter (HOSPITAL_BASED_OUTPATIENT_CLINIC_OR_DEPARTMENT_OTHER): Payer: BC Managed Care – PPO | Admitting: General Surgery

## 2021-10-19 DIAGNOSIS — I2510 Atherosclerotic heart disease of native coronary artery without angina pectoris: Secondary | ICD-10-CM | POA: Diagnosis not present

## 2021-10-19 DIAGNOSIS — E11610 Type 2 diabetes mellitus with diabetic neuropathic arthropathy: Secondary | ICD-10-CM | POA: Diagnosis not present

## 2021-10-19 DIAGNOSIS — E1169 Type 2 diabetes mellitus with other specified complication: Secondary | ICD-10-CM | POA: Diagnosis not present

## 2021-10-19 DIAGNOSIS — L97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: Secondary | ICD-10-CM | POA: Diagnosis not present

## 2021-10-19 DIAGNOSIS — T8131XA Disruption of external operation (surgical) wound, not elsewhere classified, initial encounter: Secondary | ICD-10-CM | POA: Diagnosis not present

## 2021-10-19 DIAGNOSIS — E11621 Type 2 diabetes mellitus with foot ulcer: Secondary | ICD-10-CM | POA: Diagnosis not present

## 2021-10-19 DIAGNOSIS — Z951 Presence of aortocoronary bypass graft: Secondary | ICD-10-CM | POA: Diagnosis not present

## 2021-10-19 DIAGNOSIS — M86672 Other chronic osteomyelitis, left ankle and foot: Secondary | ICD-10-CM | POA: Diagnosis not present

## 2021-10-19 LAB — GLUCOSE, CAPILLARY
Glucose-Capillary: 174 mg/dL — ABNORMAL HIGH (ref 70–99)
Glucose-Capillary: 130 mg/dL — ABNORMAL HIGH (ref 70–99)

## 2021-10-19 NOTE — Progress Notes (Signed)
ADESH, FREIDEL A. ([id number]) ?Visit Report for 10/19/2021 ?Arrival Information Details ?Patient Name: Date of Service: ?[name], [place]. 10/19/2021 10:00 A M ?Medical Record Number: [id number] ?Patient Account Number: [id number] ?Date of Birth/Sex: Treating RN: ?Oct 02, 1955 (66 y.o. [name] ?Primary Care Glendell Schlottman: [name] ?Other Clinician: [name] ?Referring Bob Eastwood: ?Treating Gola Bribiesca/Extender: [name] ?[name] ?Weeks in Treatment: 7 ?Visit Information History Since Last Visit ?All ordered tests and consults were completed: Yes ?Patient Arrived: Knee Scooter ?Added or deleted any medications: No ?Arrival Time: 09:16 ?Any new allergies or adverse reactions: No ?Accompanied By: Wife ?Had a fall or experienced change in No ?Transfer Assistance: None ?activities of daily living that may affect ?Patient Identification Verified: Yes ?risk of falls: ?Secondary Verification Process Completed: Yes ?Signs or symptoms of abuse/neglect since last visito No ?Patient Requires Transmission-Based Precautions: No ?Hospitalized since last visit: No ?Patient Has Alerts: Yes ?Implantable device outside of the clinic excluding No ?Patient Alerts: Patient on Blood Thinner cellular tissue based products placed in the center ?since last visit: ?Pain Present Now: No ?Electronic Signature(s) ?Signed: 10/19/2021 4:21:16 PM By: [name] EMT ?Entered By: [name] on 10/19/2021 16:21:15 ?-------------------------------------------------------------------------------- ?Encounter Discharge Information Details ?Patient Name: Date of Service: ?[name], [place]. 10/19/2021 10:00 A M ?Medical Record Number: [id number] ?Patient Account Number: [id number] ?Date of Birth/Sex: Treating RN: ?11-Dec-1955 (66 y.o. [name] ?Primary Care Euva Rundell: [name] ?Other Clinician: [name] ?Referring Ajmal Kathan: ?Treating Krystan Northrop/Extender: [name] ?[name] ?Weeks in Treatment: 7 ?Encounter Discharge  Information Items ?Discharge Condition: Stable ?Ambulatory Status: Knee Scooter ?Discharge Destination: Home ?Transportation: Private Auto ?Accompanied By: Wife ?Schedule Follow-up Appointment: No ?Clinical Summary of Care: ?Electronic Signature(s) ?Signed: 10/19/2021 4:27:39 PM By: [name] EMT ?Entered By: [name] on 10/19/2021 16:27:39 ?-------------------------------------------------------------------------------- ?Vitals Details ?Patient Name: ?Date of Service: ?[name], [place]. 10/19/2021 10:00 A M ?Medical Record Number: [id number] ?Patient Account Number: [id number] ?Date of Birth/Sex: ?Treating RN: ?Feb 11, 1956 (66 y.o. [name] ?Primary Care Cartier Mapel: [name] ?Other Clinician: [name] ?Referring Dimitra Woodstock: ?Treating Amariya Liskey/Extender: [name] ?[name] ?Weeks in Treatment: 7 ?Vital Signs ?Time Taken: 09:34 ?Temperature (??F): 97.5 ?Height (in): 74 ?Pulse (bpm): 79 ?Weight (lbs): 186 ?Respiratory Rate (breaths/min): 16 ?Body Mass Index (BMI): 23.9 ?Blood Pressure (mmHg): 111/73 ?Capillary Blood Glucose (mg/dl): 174 ?Reference Range: 80 - 120 mg / dl ?Electronic Signature(s) ?Signed: 10/19/2021 4:21:45 PM By: [name] EMT ?Entered By: [name] on 10/19/2021 16:21:44 ?

## 2021-10-19 NOTE — Progress Notes (Addendum)
KJELL, KIELAR A. ([id number]) ?Visit Report for 10/19/2021 ?HBO Details ?Patient Name: Date of Service: ?[name], [place]. 10/19/2021 10:00 A M ?Medical Record Number: [id number] ?Patient Account Number: [id number] ?Date of Birth/Sex: Treating RN: ?1956-05-24 (66 y.o. [name] ?Primary Care Tamrah Victorino: [name] ?Other Clinician: [name] ?Referring Cereniti Curb: ?Treating Terriona Horlacher/Extender: [name] ?[name] ?Weeks in Treatment: 7 ?HBO Treatment Course Details ?Treatment Course Number: 1 ?Ordering Talullah Abate: [name] ?T Treatments Ordered: ?otal 40 HBO Treatment Start Date: 09/20/2021 ?HBO Indication: ?Diabetic Ulcer(s) of the Lower Extremity ?Standard/Conservative Wound Care tried and failed greater than or equal to ?30 days ?Wound #1 Left, Medial Foot ?HBO Treatment Details ?Treatment Number: 21 ?Patient Type: Outpatient ?Chamber Type: Monoplace ?Chamber Serial #: [id number] ?Treatment Protocol: 2.5 ATA with 90 minutes oxygen, with two 5 minute air breaks ?Treatment Details ?Compression Rate Down: 2.0 psi / minute ?De-Compression Rate Up: 2.0 psi / minute ?A breaks and breathing ?ir ?Compress Tx Pressure periods Decompress Decompress ?Begins Reached (leave unused spaces Begins Ends ?blank) ?Chamber Pressure (ATA 1 2.5 2.5 2.5 2.5 2.5 - - 2.5 1 ?) ?Clock Time (24 hr) 09:38 09:49 10:19 10:24 10:54 10:59 - - 11:29 11:40 ?Treatment Length: 122 (minutes) ?Treatment Segments: 4 ?Vital Signs ?Capillary Blood Glucose Reference Range: 80 - 120 mg / dl ?HBO Diabetic Blood Glucose Intervention Range: <131 mg/dl or >249 mg/dl ?Time Vitals Blood Respiratory Capillary Blood Glucose Pulse Action ?Type: ?Pulse: Temperature: ?Taken: ?Pressure: ?Rate: ?Glucose (mg/dl): ?Meter #: Oximetry (%) Taken: ?Pre 09:34 111/73 79 16 97.5 174 ?Post 11:44 158/90 70 16 97.5 130 ?Treatment Response ?Treatment Toleration: Well ?Treatment Completion Status: Treatment Completed without Adverse Event ?Additional Procedure  Documentation ?Tissue Sevierity: Necrosis of bone ?Physician HBO Attestation: ?I certify that I supervised this HBO treatment in accordance with Medicare ?guidelines. A trained emergency response team is readily available per Yes ?hospital policies and procedures. ?Continue HBOT as ordered. Yes ?Electronic Signature(s) ?Signed: 10/20/2021 7:37:30 AM By: [name] MD FACS ?Previous Signature: 10/19/2021 4:25:40 PM Version By: [name] EMT ?Entered By: [name] on 10/20/2021 07:37:30 ?-------------------------------------------------------------------------------- ?HBO Safety Checklist Details ?Patient Name: ?Date of Service: ?[name], [place]. 10/19/2021 10:00 A M ?Medical Record Number: [id number] ?Patient Account Number: [id number] ?Date of Birth/Sex: ?Treating RN: ?[birth year] (66 y.o. [name] ?Primary Care Mercy Malena: [name] ?Other Clinician: [name] ?Referring Lissandro Dilorenzo: ?Treating [place]/Extender: [name] ?[name] ?Weeks in Treatment: 7 ?HBO Safety Checklist Items ?Safety Checklist ?Consent Form Signed ?Patient voided / foley secured and emptied ?When did you last eato 0710 ?Last dose of injectable or oral agent 0710 ?Ostomy pouch emptied and vented if applicable ?NA ?All implantable devices assessed, documented and approved ?NA ?Intravenous access site secured and place ?NA ?Valuables secured ?Linens and cotton and cotton/polyester blend (less than 51% polyester) ?Personal oil-based products / skin lotions / body lotions removed ?Wigs or hairpieces removed ?NA ?Smoking or tobacco materials removed ?Books / newspapers / magazines / loose paper removed ?Cologne, aftershave, perfume and deodorant removed ?Jewelry removed (may wrap wedding band) ?NA ?Make-up removed ?NA ?Hair care products removed ?Battery operated devices (external) removed ?Heating patches and chemical warmers removed ?Titanium eyewear removed ?NA ?Nail polish cured greater than 10 hours ?NA ?Casting  material cured greater than 10 hours ?NA ?Hearing aids removed ?NA ?Loose dentures or partials removed ?NA ?Prosthetics have been removed ?NA ?Patient demonstrates correct use of air break device (if applicable) ?Patient concerns have been addressed ?Patient grounding bracelet on and cord  attached to chamber ?Specifics for Inpatients (complete in addition to above) ?Medication sheet sent with patient ?NA ?Intravenous medications needed or due during therapy sent with patient ?NA ?Drainage tubes (e.g. nasogastric tube or chest tube secured and vented) ?NA ?Endotracheal or Tracheotomy tube secured ?NA ?Cuff deflated of air and inflated with saline ?NA ?Airway suctioned ?NA ?Notes ?Paper version used prior to treatment. The safety checklist was done before treatment started. ?Electronic Signature(s) ?Signed: 10/19/2021 4:24:06 PM By: [name] EMT ?Entered By: [name] on 10/19/2021 16:24:05 ?

## 2021-10-20 ENCOUNTER — Encounter (HOSPITAL_BASED_OUTPATIENT_CLINIC_OR_DEPARTMENT_OTHER): Payer: BC Managed Care – PPO | Admitting: General Surgery

## 2021-10-20 DIAGNOSIS — E11610 Type 2 diabetes mellitus with diabetic neuropathic arthropathy: Secondary | ICD-10-CM | POA: Diagnosis not present

## 2021-10-20 DIAGNOSIS — M86672 Other chronic osteomyelitis, left ankle and foot: Secondary | ICD-10-CM | POA: Diagnosis not present

## 2021-10-20 DIAGNOSIS — K219 Gastro-esophageal reflux disease without esophagitis: Secondary | ICD-10-CM | POA: Diagnosis not present

## 2021-10-20 DIAGNOSIS — I252 Old myocardial infarction: Secondary | ICD-10-CM | POA: Diagnosis not present

## 2021-10-20 DIAGNOSIS — L97522 Non-pressure chronic ulcer of other part of left foot with fat layer exposed: Secondary | ICD-10-CM | POA: Diagnosis not present

## 2021-10-20 DIAGNOSIS — D649 Anemia, unspecified: Secondary | ICD-10-CM | POA: Diagnosis not present

## 2021-10-20 DIAGNOSIS — I10 Essential (primary) hypertension: Secondary | ICD-10-CM | POA: Diagnosis not present

## 2021-10-20 DIAGNOSIS — M1990 Unspecified osteoarthritis, unspecified site: Secondary | ICD-10-CM | POA: Diagnosis not present

## 2021-10-20 DIAGNOSIS — Z7984 Long term (current) use of oral hypoglycemic drugs: Secondary | ICD-10-CM | POA: Diagnosis not present

## 2021-10-20 DIAGNOSIS — I2510 Atherosclerotic heart disease of native coronary artery without angina pectoris: Secondary | ICD-10-CM | POA: Diagnosis not present

## 2021-10-20 DIAGNOSIS — E1169 Type 2 diabetes mellitus with other specified complication: Secondary | ICD-10-CM | POA: Diagnosis not present

## 2021-10-20 DIAGNOSIS — E785 Hyperlipidemia, unspecified: Secondary | ICD-10-CM | POA: Diagnosis not present

## 2021-10-20 DIAGNOSIS — E11621 Type 2 diabetes mellitus with foot ulcer: Secondary | ICD-10-CM | POA: Diagnosis not present

## 2021-10-20 DIAGNOSIS — T8131XA Disruption of external operation (surgical) wound, not elsewhere classified, initial encounter: Secondary | ICD-10-CM | POA: Diagnosis not present

## 2021-10-20 DIAGNOSIS — Z452 Encounter for adjustment and management of vascular access device: Secondary | ICD-10-CM | POA: Diagnosis not present

## 2021-10-20 DIAGNOSIS — Z794 Long term (current) use of insulin: Secondary | ICD-10-CM | POA: Diagnosis not present

## 2021-10-20 DIAGNOSIS — Z792 Long term (current) use of antibiotics: Secondary | ICD-10-CM | POA: Diagnosis not present

## 2021-10-20 DIAGNOSIS — E1140 Type 2 diabetes mellitus with diabetic neuropathy, unspecified: Secondary | ICD-10-CM | POA: Diagnosis not present

## 2021-10-20 DIAGNOSIS — L97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: Secondary | ICD-10-CM | POA: Diagnosis not present

## 2021-10-20 DIAGNOSIS — Z951 Presence of aortocoronary bypass graft: Secondary | ICD-10-CM | POA: Diagnosis not present

## 2021-10-20 DIAGNOSIS — Z7982 Long term (current) use of aspirin: Secondary | ICD-10-CM | POA: Diagnosis not present

## 2021-10-20 LAB — GLUCOSE, CAPILLARY
Glucose-Capillary: 202 mg/dL — ABNORMAL HIGH (ref 70–99)
Glucose-Capillary: 243 mg/dL — ABNORMAL HIGH (ref 70–99)

## 2021-10-20 NOTE — Progress Notes (Signed)
[name], Colin A. ([id number]) ?Visit Report for 10/20/2021 ?Arrival Information Details ?Patient Name: Date of Service: ?[name], [name] A. 10/20/2021 10:00 A M ?Medical Record Number: [id number] ?Patient Account Number: [id number] ?Date of Birth/Sex: Treating RN: ?09-08-1955 (66 y.o. [name] ?Primary Care Cienna Dumais: [name] ?Other Clinician: [name] ?Referring Roxy Filler: ?Treating Antwione Picotte/Extender: [name] ?[name] ?Weeks in Treatment: 8 ?Visit Information History Since Last Visit ?All ordered tests and consults were completed: Yes ?Patient Arrived: Knee Scooter ?Added or deleted any medications: No ?Arrival Time: 09:01 ?Any new allergies or adverse reactions: No ?Accompanied By: Wife ?Had a fall or experienced change in No ?Transfer Assistance: None ?activities of daily living that may affect ?Patient Identification Verified: Yes ?risk of falls: ?Secondary Verification Process Completed: Yes ?Signs or symptoms of abuse/neglect since last visito No ?Patient Requires Transmission-Based Precautions: No ?Hospitalized since last visit: No ?Patient Has Alerts: Yes ?Implantable device outside of the clinic excluding No ?Patient Alerts: Patient on Blood Thinner cellular tissue based products placed in the center ?since last visit: ?Pain Present Now: No ?Electronic Signature(s) ?Signed: 10/20/2021 3:56:11 PM By: [name] EMT ?Entered By: [name] on 10/20/2021 15:56:11 ?-------------------------------------------------------------------------------- ?Encounter Discharge Information Details ?Patient Name: Date of Service: ?[name], [name] A. 10/20/2021 10:00 A M ?Medical Record Number: [id number] ?Patient Account Number: [id number] ?Date of Birth/Sex: Treating RN: ?08-12-1955 (66 y.o. [name] ?Primary Care Grover Robinson: [name] ?Other Clinician: [name] ?Referring Yola Paradiso: ?Treating Quantisha Marsicano/Extender: [name] ?[name] ?Weeks in Treatment: 8 ?Encounter  Discharge Information Items ?Discharge Condition: Stable ?Ambulatory Status: Knee Scooter ?Discharge Destination: Home ?Transportation: Private Auto ?Accompanied By: Wife ?Schedule Follow-up Appointment: No ?Clinical Summary of Care: ?Electronic Signature(s) ?Signed: 10/20/2021 4:04:30 PM By: [name] EMT ?Entered By: [name] on 10/20/2021 16:04:29 ?-------------------------------------------------------------------------------- ?Vitals Details ?Patient Name: ?Date of Service: ?[name], [name] A. 10/20/2021 10:00 A M ?Medical Record Number: [id number] ?Patient Account Number: [id number] ?Date of Birth/Sex: ?Treating RN: ?[birth year] (66 y.o. [name] ?Primary Care Stina Gane: [name] ?Other Clinician: [name] ?Referring Jex Strausbaugh: ?Treating Hiromi Knodel/Extender: [name] ?[name] ?Weeks in Treatment: 8 ?Vital Signs ?Time Taken: 09:17 ?Temperature (??F): 97.3 ?Height (in): 74 ?Pulse (bpm): 80 ?Weight (lbs): 186 ?Respiratory Rate (breaths/min): 16 ?Body Mass Index (BMI): 23.9 ?Blood Pressure (mmHg): 123/82 ?Capillary Blood Glucose (mg/dl): 224 ?Reference Range: 80 - 120 mg / dl ?Electronic Signature(s) ?Signed: 10/20/2021 3:56:59 PM By: [name] EMT ?Entered By: [name] on 10/20/2021 15:56:59 ?

## 2021-10-20 NOTE — Progress Notes (Addendum)
ATHONY, COPPA A. ([id number]) ?Visit Report for 10/20/2021 ?HBO Details ?Patient Name: Date of Service: ?[name], [name] 10/20/2021 10:00 A M ?Medical Record Number: [id number] ?Patient Account Number: [id number] ?Date of Birth/Sex: Treating RN: ?September 09, 1955 (66 y.o. [name] ?Primary Care Carlson Belland: [name] ?Other Clinician: [name] ?Referring Audreena Sachdeva: ?Treating Phyliss Hulick/Extender: [name] ?[name] ?Weeks in Treatment: 8 ?HBO Treatment Course Details ?Treatment Course Number: 1 ?Ordering Json Koelzer: [name] ?T Treatments Ordered: ?otal 40 HBO Treatment Start Date: 09/20/2021 ?HBO Indication: ?Diabetic Ulcer(s) of the Lower Extremity ?Standard/Conservative Wound Care tried and failed greater than or equal to ?30 days ?Wound #1 Left, Medial Foot ?HBO Treatment Details ?Treatment Number: 22 ?Patient Type: Outpatient ?Chamber Type: Monoplace ?Chamber Serial #: [id number] ?Treatment Protocol: 2.5 ATA with 90 minutes oxygen, with two 5 minute air breaks ?Treatment Details ?Compression Rate Down: 2.0 psi / minute ?De-Compression Rate Up: 2.0 psi / minute ?A breaks and breathing ?ir ?Compress Tx Pressure periods Decompress Decompress ?Begins Reached (leave unused spaces Begins Ends ?blank) ?Chamber Pressure (ATA 1 2.5 2.5 2.5 2.5 2.5 - - 2.5 1 ?) ?Clock Time (24 hr) 09:24 09:37 10:08 10:13 10:43 10:48 - - 11:18 11:32 ?Treatment Length: 128 (minutes) ?Treatment Segments: 4 ?Vital Signs ?Capillary Blood Glucose Reference Range: 80 - 120 mg / dl ?HBO Diabetic Blood Glucose Intervention Range: <131 mg/dl or >967 mg/dl ?Time Vitals Blood Respiratory Capillary Blood Glucose Pulse Action ?Type: ?Pulse: Temperature: ?Taken: ?Pressure: ?Rate: ?Glucose (mg/dl): ?Meter #: Oximetry (%) Taken: ?Pre 09:17 123/82 80 16 97.3 202 ?Post 11:34 155/96 71 16 97.2 243 ?Treatment Response ?Treatment Toleration: Well ?Treatment Completion Status: Treatment Completed without Adverse Event ?Treatment Notes ?The  patient was asked if he had problems with his ears or sinus before treatment started today. He stated no. ?His treatment went well today with no problems noted. ?After treatment he was asked about his ear and sinus he had no complaints to report. ?Additional Procedure Documentation ?Tissue Sevierity: Necrosis of bone ?Physician HBO Attestation: ?I certify that I supervised this HBO treatment in accordance with Medicare ?guidelines. A trained emergency response team is readily available per Yes ?hospital policies and procedures. ?Continue HBOT as ordered. Yes ?Electronic Signature(s) ?Signed: 10/20/2021 5:19:36 PM By: [name] MD FACS ?Previous Signature: 10/20/2021 4:02:42 PM Version By: [name] EMT ?Previous Signature: 10/20/2021 4:02:42 PM Version By: [name] EMT ?Entered By: [name] on 10/20/2021 17:19:35 ?-------------------------------------------------------------------------------- ?HBO Safety Checklist Details ?Patient Name: ?Date of Service: ?[name], [name] 10/20/2021 10:00 A M ?Medical Record Number: [id number] ?Patient Account Number: [id number] ?Date of Birth/Sex: ?Treating RN: ?1956/01/27 (66 y.o. [name] ?Primary Care Finlay Godbee: [name] ?Other Clinician: [name] ?Referring Deondray Ospina: ?Treating Kjirsten Bloodgood/Extender: [name] ?[name] ?Weeks in Treatment: 8 ?HBO Safety Checklist Items ?Safety Checklist ?Consent Form Signed ?Patient voided / foley secured and emptied ?When did you last eato 0700 ?Last dose of injectable or oral agent 0700 ?Ostomy pouch emptied and vented if applicable ?NA ?All implantable devices assessed, documented and approved ?NA ?Intravenous access site secured and place ?NA ?Valuables secured ?Linens and cotton and cotton/polyester blend (less than 51% polyester) ?Personal oil-based products / skin lotions / body lotions removed ?Wigs or hairpieces removed ?NA ?Smoking or tobacco materials removed ?Books / newspapers /  magazines / loose paper removed ?Cologne, aftershave, perfume and deodorant removed ?Jewelry removed (may wrap wedding band) ?NA ?Make-up removed ?NA ?Hair care products removed ?Battery operated devices (external) removed ?Heating patches and chemical warmers  removed ?Titanium eyewear removed ?NA ?Nail polish cured greater than 10 hours ?NA ?Casting material cured greater than 10 hours ?NA ?Hearing aids removed ?NA ?Loose dentures or partials removed ?NA ?Prosthetics have been removed ?NA ?Patient demonstrates correct use of air break device (if applicable) ?Patient concerns have been addressed ?Patient grounding bracelet on and cord attached to chamber ?Specifics for Inpatients (complete in addition to above) ?Medication sheet sent with patient ?NA ?Intravenous medications needed or due during therapy sent with patient ?NA ?Drainage tubes (e.g. nasogastric tube or chest tube secured and vented) ?NA ?Endotracheal or Tracheotomy tube secured ?NA ?Cuff deflated of air and inflated with saline ?NA ?Airway suctioned ?NA ?Notes ?Paper version used prior to treatment. The safety checklist was done before treatment started. ?Electronic Signature(s) ?Signed: 10/20/2021 3:58:27 PM By: [name] EMT ?Entered By: [name] on 10/20/2021 15:58:27 ?

## 2021-10-20 NOTE — Progress Notes (Signed)
ROTH, HOPF A. ([id number]) ?Visit Report for 10/20/2021 ?Problem List Details ?Patient Name: Date of Service: ?[name], Christian A. 10/20/2021 10:00 A M ?Medical Record Number: [id number] ?Patient Account Number: [id number] ?Date of Birth/Sex: Treating RN: ?1955-10-30 (66 y.o. 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 08/25/2021 No Yes ?E11.621 Type 2 diabetes mellitus with foot ulcer 08/25/2021 No Yes ?I25.10 Atherosclerotic heart disease of native coronary artery without angina pectoris 08/25/2021 No Yes ?[id number] 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 10/20/2021 ?Medical Record Number: [id number] ?Patient Account Number: [id number] ?Date of Birth/Sex: Treating RN: ?1956-04-27 (66 y.o. 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 ?Facility Procedures ?CPT4 Code: [id number] ?Description: G0277-(Facility Use Only) HBOT full body chamber, 28min , ICD-10 Diagnosis Description E11.621 Type 2 diabetes mellitus with foot ulcer 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 M86.672 Other chronic  ?osteomyelitis, left ankle and foot 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 ?Modifier: ?Quantity: 4 ?Physician Procedures ?: CPT4 Code Description Modifier [id number] - WC PHYS HYPERBARIC OXYGEN THERAPY ICD-10 Diagnosis Description E11.621 Type 2 diabetes mellitus with foot ulcer 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

## 2021-10-20 NOTE — Progress Notes (Signed)
[name], [name]. ([id number]) ?Visit Report for 10/19/2021 ?Problem List Details ?Patient Name: Date of Service: ?[name], [name]. 10/19/2021 10:00 [name] ?Medical Record Number: [id number] ?Patient Account Number: [id number] ?Date of Birth/Sex: Treating RN: ?1955/10/24 (66 y.o. 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 08/25/2021 No Yes ?E11.621 Type 2 diabetes mellitus with foot ulcer 08/25/2021 No Yes ?I25.10 Atherosclerotic heart disease of native coronary artery without angina pectoris 08/25/2021 No Yes ?A19.379 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 10/19/2021 ?Medical Record Number: [id number] ?Patient Account Number: [id number] ?Date of Birth/Sex: Treating RN: ?1956/03/30 (66 y.o. 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Modifier: ?Quantity: 4 ?Physician Procedures ?: CPT4 Code Description Modifier [id number] [phone] - WC PHYS HYPERBARIC OXYGEN THERAPY ICD-10 Diagnosis Description E11.621 Type 2 diabetes mellitus with foot ulcer 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

## 2021-10-21 ENCOUNTER — Encounter (HOSPITAL_BASED_OUTPATIENT_CLINIC_OR_DEPARTMENT_OTHER): Payer: BC Managed Care – PPO | Admitting: General Surgery

## 2021-10-21 DIAGNOSIS — E1169 Type 2 diabetes mellitus with other specified complication: Secondary | ICD-10-CM | POA: Diagnosis not present

## 2021-10-21 DIAGNOSIS — T8131XA Disruption of external operation (surgical) wound, not elsewhere classified, initial encounter: Secondary | ICD-10-CM | POA: Diagnosis not present

## 2021-10-21 DIAGNOSIS — Z951 Presence of aortocoronary bypass graft: Secondary | ICD-10-CM | POA: Diagnosis not present

## 2021-10-21 DIAGNOSIS — I2510 Atherosclerotic heart disease of native coronary artery without angina pectoris: Secondary | ICD-10-CM | POA: Diagnosis not present

## 2021-10-21 DIAGNOSIS — M86672 Other chronic osteomyelitis, left ankle and foot: Secondary | ICD-10-CM | POA: Diagnosis not present

## 2021-10-21 DIAGNOSIS — L97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: Secondary | ICD-10-CM | POA: Diagnosis not present

## 2021-10-21 DIAGNOSIS — E11621 Type 2 diabetes mellitus with foot ulcer: Secondary | ICD-10-CM | POA: Diagnosis not present

## 2021-10-21 DIAGNOSIS — E11610 Type 2 diabetes mellitus with diabetic neuropathic arthropathy: Secondary | ICD-10-CM | POA: Diagnosis not present

## 2021-10-21 LAB — GLUCOSE, CAPILLARY
Glucose-Capillary: 170 mg/dL — ABNORMAL HIGH (ref 70–99)
Glucose-Capillary: 127 mg/dL — ABNORMAL HIGH (ref 70–99)

## 2021-10-21 NOTE — Progress Notes (Signed)
RAYMEL, CULL A. ([id number]) ?Visit Report for 10/21/2021 ?Problem List Details ?Patient Name: Date of Service: ?[name], [name] 10/21/2021 10:00 A M ?Medical Record Number: [id number] ?Patient Account Number: [id number] ?Date of Birth/Sex: Treating RN: ?1956-05-27 (66 y.o. 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 08/25/2021 No Yes ?E11.621 Type 2 diabetes mellitus with foot ulcer 08/25/2021 No Yes ?I25.10 Atherosclerotic heart disease of native coronary artery without angina pectoris 08/25/2021 No Yes ?W58.099 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 10/21/2021 ?Medical Record Number: [id number] ?Patient Account Number: [id number] ?Date of Birth/Sex: Treating RN: ?1955/11/18 (66 y.o. 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Modifier: ?Quantity: 4 ?Physician Procedures ?: CPT4 Code Description Modifier [id number] [phone] - WC PHYS HYPERBARIC OXYGEN THERAPY ICD-10 Diagnosis Description E11.621 Type 2 diabetes mellitus with foot ulcer 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

## 2021-10-21 NOTE — Progress Notes (Addendum)
[name], Colin A. ([id number]) ?Visit Report for 10/21/2021 ?HBO Details ?Patient Name: Date of Service: ?NA Colin N, Colin A. 10/21/2021 10:00 A M ?Medical Record Number: [id number] ?Patient Account Number: [id number] ?Date of Birth/Sex: Treating RN: ?24-Jun-1955 (66 y.o. [name] ?Primary Care Abed Schar: [name] ?Other Clinician: [name] ?Referring Yanessa Hocevar: ?Treating Acel Natzke/Extender: [name] ?[name] ?Weeks in Treatment: 8 ?HBO Treatment Course Details ?Treatment Course Number: 1 ?Ordering Othon Guardia: [name] ?T Treatments Ordered: ?otal 40 HBO Treatment Start Date: 09/20/2021 ?HBO Indication: ?Diabetic Ulcer(s) of the Lower Extremity ?Standard/Conservative Wound Care tried and failed greater than or equal to ?30 days ?Wound #1 Left, Medial Foot ?HBO Treatment Details ?Treatment Number: [id number] ?Patient Type: Outpatient ?Chamber Type: Monoplace ?Chamber Serial #: [id number] ?Treatment Protocol: 2.5 ATA with 90 minutes oxygen, with two 5 minute air breaks ?Treatment Details ?Compression Rate Down: 2.0 psi / minute ?De-Compression Rate Up: 2.0 psi / minute ?A breaks and breathing ?ir ?Compress Tx Pressure periods Decompress Decompress ?Begins Reached (leave unused spaces Begins Ends ?blank) ?Chamber Pressure (ATA 1 2.5 2.5 2.5 2.5 2.5 - - 2.5 1 ?) ?Clock Time (24 hr) 09:50 10:03 10:33 10:38 11:09 11:14 - - 11:44 11:54 ?Treatment Length: 124 (minutes) ?Treatment Segments: 4 ?Vital Signs ?Capillary Blood Glucose Reference Range: 80 - 120 mg / dl ?HBO Diabetic Blood Glucose Intervention Range: <131 mg/dl or >249 mg/dl ?Time Vitals Blood Respiratory Capillary Blood Glucose Pulse Action ?Type: ?Pulse: Temperature: ?Taken: ?Pressure: ?Rate: ?Glucose (mg/dl): ?Meter #: Oximetry (%) Taken: ?Pre 09:47 126/82 88 16 97.7 170 ?Post 11:57 167/88 79 16 97.2 127 ?Treatment Response ?Treatment Toleration: Well ?Treatment Completion Status: Treatment Completed without Adverse Event ?Additional Procedure  Documentation ?Tissue Sevierity: Necrosis of bone ?Physician HBO Attestation: ?I certify that I supervised this HBO treatment in accordance with Medicare ?guidelines. A trained emergency response team is readily available per Yes ?hospital policies and procedures. ?Continue HBOT as ordered. Yes ?Electronic Signature(s) ?Signed: 10/21/2021 4:14:37 PM By: [name] MD FACS ?Previous Signature: 10/21/2021 2:23:14 PM Version By: [name] EMT ?Entered By: [name] on 10/21/2021 16:14:37 ?-------------------------------------------------------------------------------- ?HBO Safety Checklist Details ?Patient Name: ?Date of Service: ?NA Colin N, [name] 10/21/2021 10:00 A M ?Medical Record Number: [id number] ?Patient Account Number: [id number] ?Date of Birth/Sex: ?Treating RN: ?January 21, 1956 (66 y.o. [name] ?Primary Care Shakiyah Cirilo: [name] ?Other Clinician: [name] ?Referring Don Tiu: ?Treating Wilmer Santillo/Extender: [name] ?[name] ?Weeks in Treatment: 8 ?HBO Safety Checklist Items ?Safety Checklist ?Consent Form Signed ?Patient voided / foley secured and emptied ?When did you last eato 0710 ?Last dose of injectable or oral agent 0710 ?Ostomy pouch emptied and vented if applicable ?NA ?All implantable devices assessed, documented and approved ?NA ?Intravenous access site secured and place ?NA ?Valuables secured ?Linens and cotton and cotton/polyester blend (less than 51% polyester) ?Personal oil-based products / skin lotions / body lotions removed ?Wigs or hairpieces removed ?NA ?Smoking or tobacco materials removed ?Books / newspapers / magazines / loose paper removed ?Cologne, aftershave, perfume and deodorant removed ?Jewelry removed (may wrap wedding band) ?NA ?Make-up removed ?NA ?Hair care products removed ?Battery operated devices (external) removed ?Heating patches and chemical warmers removed ?Titanium eyewear removed ?NA ?Nail polish cured greater than 10  hours ?NA ?Casting material cured greater than 10 hours ?NA ?Hearing aids removed ?NA ?Loose dentures or partials removed ?NA ?Prosthetics have been removed ?NA ?Patient demonstrates correct use of air break device (if applicable) ?Patient concerns have been addressed ?Patient grounding bracelet on and cord  attached to chamber ?Specifics for Inpatients (complete in addition to above) ?Medication sheet sent with patient ?NA ?Intravenous medications needed or due during therapy sent with patient ?NA ?Drainage tubes (e.g. nasogastric tube or chest tube secured and vented) ?NA ?Endotracheal or Tracheotomy tube secured ?NA ?Cuff deflated of air and inflated with saline ?NA ?Airway suctioned ?NA ?Notes ?Paper version used prior to treatment. The safety checklist was done before treatment started. ?Electronic Signature(s) ?Signed: 10/21/2021 2:19:45 PM By: [name] EMT ?Entered By: [name] on 10/21/2021 14:19:45 ?

## 2021-10-21 NOTE — Progress Notes (Signed)
KRYSTAL, DELDUCA A. ([id number]) ?Visit Report for 10/21/2021 ?Arrival Information Details ?Patient Name: Date of Service: ?[name], [name] 10/21/2021 10:00 A M ?Medical Record Number: [id number] ?Patient Account Number: [id number] ?Date of Birth/Sex: Treating RN: ?12-23-1955 (66 y.o. [name] ?Primary Care Clarissa Laird: [name] ?Other Clinician: [name] ?Referring Dyllin Gulley: ?Treating Naima Veldhuizen/Extender: [name] ?[name] ?Weeks in Treatment: 8 ?Visit Information History Since Last Visit ?All ordered tests and consults were completed: Yes ?Patient Arrived: Knee Scooter ?Added or deleted any medications: No ?Arrival Time: 09:47 ?Any new allergies or adverse reactions: No ?Accompanied By: Wife ?Had a fall or experienced change in No ?Transfer Assistance: None ?activities of daily living that may affect ?Patient Requires Transmission-Based Precautions: No ?risk of falls: ?Patient Has Alerts: Yes ?Signs or symptoms of abuse/neglect since last visito No ?Patient Alerts: Patient on Blood Thinner ?Hospitalized since last visit: No ?Implantable device outside of the clinic excluding No ?cellular tissue based products placed in the center ?since last visit: ?Pain Present Now: No ?Electronic Signature(s) ?Signed: 10/21/2021 2:17:54 PM By: [name] EMT ?Entered By: [name] on 10/21/2021 14:17:54 ?-------------------------------------------------------------------------------- ?Encounter Discharge Information Details ?Patient Name: Date of Service: ?[name], [name] 10/21/2021 10:00 A M ?Medical Record Number: [id number] ?Patient Account Number: [id number] ?Date of Birth/Sex: Treating RN: ?06/28/1955 (66 y.o. [name] ?Primary Care Smera Guyette: [name] ?Other Clinician: [name] ?Referring Caydence Enck: ?Treating Jamorian Dimaria/Extender: [name] ?[name] ?Weeks in Treatment: 8 ?Encounter Discharge Information Items ?Ambulatory Status: Knee Scooter ?Discharge Destination:  Home ?Transportation: Private Auto ?Accompanied By: Wife ?Schedule Follow-up Appointment: Yes ?Clinical Summary of Care: ?Electronic Signature(s) ?Signed: 10/21/2021 2:25:00 PM By: [name] EMT ?Entered By: [name] on 10/21/2021 14:25:00 ?-------------------------------------------------------------------------------- ?Vitals Details ?Patient Name: ?Date of Service: ?[name], [name] 10/21/2021 10:00 A M ?Medical Record Number: [id number] ?Patient Account Number: [id number] ?Date of Birth/Sex: ?Treating RN: ?07-Jan-1956 (66 y.o. [name] ?Primary Care Linn Goetze: [name] ?Other Clinician: [name] ?Referring Tariana Moldovan: ?Treating Viktoriya Glaspy/Extender: [name] ?[name] ?Weeks in Treatment: 8 ?Vital Signs ?Time Taken: 09:47 ?Temperature (??F): 97.7 ?Height (in): 74 ?Pulse (bpm): 88 ?Weight (lbs): 186 ?Respiratory Rate (breaths/min): 16 ?Body Mass Index (BMI): 23.9 ?Blood Pressure (mmHg): 126/82 ?Capillary Blood Glucose (mg/dl): 789 ?Reference Range: 80 - 120 mg / dl ?Electronic Signature(s) ?Signed: 10/21/2021 2:18:26 PM By: [name] EMT ?Entered By: [name] on 10/21/2021 14:18:25 ?

## 2021-10-22 ENCOUNTER — Encounter (HOSPITAL_BASED_OUTPATIENT_CLINIC_OR_DEPARTMENT_OTHER): Payer: BC Managed Care – PPO | Admitting: General Surgery

## 2021-10-22 DIAGNOSIS — I2510 Atherosclerotic heart disease of native coronary artery without angina pectoris: Secondary | ICD-10-CM | POA: Diagnosis not present

## 2021-10-22 DIAGNOSIS — M86672 Other chronic osteomyelitis, left ankle and foot: Secondary | ICD-10-CM | POA: Diagnosis not present

## 2021-10-22 DIAGNOSIS — Z951 Presence of aortocoronary bypass graft: Secondary | ICD-10-CM | POA: Diagnosis not present

## 2021-10-22 DIAGNOSIS — T8131XA Disruption of external operation (surgical) wound, not elsewhere classified, initial encounter: Secondary | ICD-10-CM | POA: Diagnosis not present

## 2021-10-22 DIAGNOSIS — E1169 Type 2 diabetes mellitus with other specified complication: Secondary | ICD-10-CM | POA: Diagnosis not present

## 2021-10-22 DIAGNOSIS — E11610 Type 2 diabetes mellitus with diabetic neuropathic arthropathy: Secondary | ICD-10-CM | POA: Diagnosis not present

## 2021-10-22 DIAGNOSIS — E11621 Type 2 diabetes mellitus with foot ulcer: Secondary | ICD-10-CM | POA: Diagnosis not present

## 2021-10-22 DIAGNOSIS — L97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: Secondary | ICD-10-CM | POA: Diagnosis not present

## 2021-10-22 LAB — GLUCOSE, CAPILLARY
Glucose-Capillary: 167 mg/dL — ABNORMAL HIGH (ref 70–99)
Glucose-Capillary: 182 mg/dL — ABNORMAL HIGH (ref 70–99)

## 2021-10-22 NOTE — Progress Notes (Signed)
MARBIN, OLSHEFSKI A. ([id number]) ?Visit Report for 10/22/2021 ?Arrival Information Details ?Patient Name: Date of Service: ?NA [name] 10/22/2021 12:45 PM ?Medical Record Number: [id number] ?Patient Account Number: [id number] ?Date of Birth/Sex: Treating RN: ?March 19, 1956 (66 y.o. M) Elesa Hacker, [name] ?Primary Care Yojan Paskett: [name] Other Clinician: ?Referring Lyann Hagstrom: ?Treating Lashann Hagg/Extender: [name] ?[name] ?Weeks in Treatment: 8 ?Visit Information History Since Last Visit ?Added or deleted any medications: No ?Patient Arrived: Knee Scooter ?Any new allergies or adverse reactions: No ?Arrival Time: 12:54 ?Had a fall or experienced change in No ?Accompanied By: Self ?activities of daily living that may affect ?Transfer Assistance: None ?risk of falls: ?Patient Identification Verified: Yes ?Signs or symptoms of abuse/neglect since last visito No ?Secondary Verification Process Completed: Yes ?Hospitalized since last visit: No ?Patient Requires Transmission-Based Precautions: No ?Implantable device outside of the clinic excluding No ?Patient Has Alerts: Yes ?cellular tissue based products placed in the center ?Patient Alerts: Patient on Blood Thinner since last visit: ?Has Dressing in Place as Prescribed: Yes ?Pain Present Now: No ?Electronic Signature(s) ?Signed: 10/22/2021 2:09:01 PM By: Shawn Stall RN, BSN ?Entered By: Shawn Stall on 10/22/2021 12:54:23 ?-------------------------------------------------------------------------------- ?Encounter Discharge Information Details ?Patient Name: Date of Service: ?NA [name] 10/22/2021 12:45 PM ?Medical Record Number: [id number] ?Patient Account Number: [id number] ?Date of Birth/Sex: Treating RN: ?Sep 11, 1955 (66 y.o. M) Elesa Hacker, [name] ?Primary Care Ikram Riebe: [name] Other Clinician: ?Referring Mardell Cragg: ?Treating Deziray Nabi/Extender: [name] ?[name] ?Weeks in Treatment: 8 ?Encounter Discharge Information Items Post Procedure  Vitals ?Discharge Condition: Stable ?Temperature (F): 98.7 ?Ambulatory Status: Knee Scooter ?Pulse (bpm): 91 ?Discharge Destination: Home ?Respiratory Rate (breaths/min): 20 ?Transportation: Private Auto ?Blood Pressure (mmHg): 144/78 ?Accompanied By: self ?Schedule Follow-up Appointment: Yes ?Clinical Summary of Care: ?Electronic Signature(s) ?Signed: 10/22/2021 2:09:01 PM By: Shawn Stall RN, BSN ?Entered By: Shawn Stall on 10/22/2021 13:14:27 ?-------------------------------------------------------------------------------- ?Lower Extremity Assessment Details ?Patient Name: ?Date of Service: ?NA [name] 10/22/2021 12:45 PM ?Medical Record Number: [id number] ?Patient Account Number: [id number] ?Date of Birth/Sex: ?Treating RN: ?Jul 15, 1955 (66 y.o. M) Elesa Hacker, [name] ?Primary Care Briya Lookabaugh: [name] ?Other Clinician: ?Referring Jahmia Berrett: ?Treating Vibha Ferdig/Extender: [name] ?[name] ?Weeks in Treatment: 8 ?Edema Assessment ?Assessed: [Left: Yes] [Right: No] ?Edema: [Left: Ye] [Right: s] ?Calf ?Left: Right: ?Point of Measurement: From Medial Instep 35 cm ?Ankle ?Left: Right: ?Point of Measurement: From Medial Instep 24 cm ?Vascular Assessment ?Pulses: ?Dorsalis Pedis ?Palpable: [Left:Yes] ?Electronic Signature(s) ?Signed: 10/22/2021 2:09:01 PM By: Shawn Stall RN, BSN ?Entered By: Shawn Stall on 10/22/2021 12:55:13 ?-------------------------------------------------------------------------------- ?Multi Wound Chart Details ?Patient Name: ?Date of Service: ?NA [name] 10/22/2021 12:45 PM ?Medical Record Number: [id number] ?Patient Account Number: [id number] ?Date of Birth/Sex: ?Treating RN: ?[birth year] (66 y.o. [name] ?Primary Care Kaelie Henigan: [name] ?Other Clinician: ?Referring Hanna Ra: ?Treating Jedrek Dinovo/Extender: [name] ?[name] ?Weeks in Treatment: 8 ?Vital Signs ?Height(in): 74 ?Pulse(bpm): 91 ?Weight(lbs): 186 ?Blood Pressure(mmHg): 144/78 ?Body Mass  Index(BMI): 23.9 ?Temperature(??F): 98.7 ?Respiratory Rate(breaths/min): 16 ?Photos: [1:Left, Medial Foot] [N/A:N/A N/A] ?Wound Location: [1:Gradually Appeared] [N/A:N/A] ?Wounding Event: [1:Diabetic Wound/Ulcer of the Lower] [N/A:N/A] ?Primary Etiology: [1:Extremity Cataracts, Coronary Artery Disease, N/A] ?Comorbid History: [1:Hypertension, Myocardial Infarction, Peripheral Arterial Disease, Type II Diabetes, Osteomyelitis, Neuropathy 02/22/2021] [N/A:N/A] ?Date Acquired: [1:8] [N/A:N/A] ?Weeks of Treatment: [1:Open] [N/A:N/A] ?Wound Status: [1:No] [N/A:N/A] ?Wound Recurrence: [1:6.1x4x1] [N/A:N/A] ?Measurements L x W x D (cm) [1:19.164] [N/A:N/A] ?A (cm?) : ?rea [1:19.164] [N/A:N/A] ?Volume (cm?) : [1:-35.60%] [N/A:N/A] ?% Reduction in A [1:rea: -35.60%] [N/A:N/A] ?%  Reduction in Volume: [1:9] ?Position 1 (o'clock): [1:1] ?Maximum Distance 1 (cm): [1:10] ?Starting Position 1 (o'clock): [1:2] ?Ending Position 1 (o'clock): [1:3.2] ?Maximum Distance 1 (cm): [1:Yes] [N/A:N/A] ?Tunneling: [1:Yes] [N/A:N/A] ?Undermining: [1:Grade 3] [N/A:N/A] ?Classification: [1:Medium] [N/A:N/A] ?Exudate A mount: [1:Serosanguineous] [N/A:N/A] ?Exudate Type: [1:red, brown] [N/A:N/A] ?Exudate Color: [1:Distinct, outline attached] [N/A:N/A] ?Wound Margin: [1:Large (67-100%)] [N/A:N/A] ?Granulation A mount: [1:Red, Pink, Hyper-granulation] [N/A:N/A] ?Granulation Quality: [1:Small (1-33%)] [N/A:N/A] ?Necrotic A mount: ?[1:Fat Layer (Subcutaneous Tissue): Yes N/A] ?Exposed Structures: ?[1:Bone: Yes Fascia: No Tendon: No Muscle: No Joint: No Small (1-33%)] [N/A:N/A] ?Epithelialization: [1:Debridement - Excisional] [N/A:N/A] ?Debridement: ?Pre-procedure Verification/Time Out 13:00 [N/A:N/A] ?Taken: [1:Other] [N/A:N/A] ?Pain Control: [1:Bone, Subcutaneous, Slough] [N/A:N/A] ?Tissue Debrided: [1:Skin/Subcutaneous] [N/A:N/A] ?Level: [1:Tissue/Muscle/Bone 24.4] [N/A:N/A] ?Debridement A (sq cm): [1:rea Curette] [N/A:N/A] ?Instrument: [1:Moderate]  [N/A:N/A] ?Bleeding: [1:Pressure] [N/A:N/A] ?Hemostasis Achieved: [1:0] [N/A:N/A] ?Procedural Pain: [1:0] [N/A:N/A] ?Post Procedural Pain: ?Debridement Treatment Response: Procedure was tolerated well [N/A:N/A] ?Post Debridement Measurements L x 6.1x4x1 [N/A:N/A] ?W x D (cm) [1:19.164] [N/A:N/A] ?Post Debridement Volume: (cm?) [1:Debridement] [N/A:N/A] ?Procedures Performed: [1:Negative Pressure Wound Therapy Maintenance (NPWT)] ?Treatment Notes ?Wound #1 (Foot) Wound Laterality: Left, Medial ?Cleanser ?Wound Cleanser ?Discharge Instruction: Cleanse the wound with wound cleanser prior to applying a clean dressing using gauze sponges, not tissue or cotton balls. ?Peri-Wound Care ?Skin Prep ?Discharge Instruction: Use skin prep as directed ?Zinc Oxide Ointment 30g tube ?Discharge Instruction: Apply Zinc Oxide to periwound with each dressing change ?Topical ?Primary Dressing ?Promogran Prisma Matrix, 4.34 (sq in) (silver collagen) ?Discharge Instruction: Moisten collagen with saline or hydrogel ?Wound Vac ?Secondary Dressing ?Secured With ?Compression Wrap ?Compression Stockings ?Add-Ons ?Electronic Signature(s) ?Signed: 10/22/2021 1:22:34 PM By: [name] MD FACS ?Signed: 10/22/2021 5:54:45 PM By: Zandra Abts RN, BSN ?Entered By: [name] on 10/22/2021 13:22:34 ?-------------------------------------------------------------------------------- ?Multi-Disciplinary Care Plan Details ?Patient Name: ?Date of Service: ?NA [name] 10/22/2021 12:45 PM ?Medical Record Number: [id number] ?Patient Account Number: [id number] ?Date of Birth/Sex: ?Treating RN: ?Sep 26, 1955 (66 y.o. M) Elesa Hacker, [name] ?Primary Care Djon Tith: [name] ?Other Clinician: ?Referring Sheba Whaling: ?Treating Sheccid Lahmann/Extender: [name] ?[name] ?Weeks in Treatment: 8 ?Multidisciplinary Care Plan reviewed with physician ?Active Inactive ?HBO ?Nursing Diagnoses: ?Anxiety related to feelings of confinement associated with the  hyperbaric oxygen chamber ?Anxiety related to knowledge deficit of hyperbaric oxygen therapy and treatment procedures ?Discomfort related to temperature and humidity changes inside hyperbaric chamber ?Potential for barotra

## 2021-10-22 NOTE — Progress Notes (Signed)
YVON, MCCORD A. ([id number]) ?Visit Report for 10/22/2021 ?Problem List Details ?Patient Name: Date of Service: ?[name], [name] 10/22/2021 10:00 A M ?Medical Record Number: [id number] ?Patient Account Number: [id number] ?Date of Birth/Sex: Treating RN: ?Nov 19, 1955 (66 y.o. 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 08/25/2021 No Yes ?E11.621 Type 2 diabetes mellitus with foot ulcer 08/25/2021 No Yes ?I25.10 Atherosclerotic heart disease of native coronary artery without angina pectoris 08/25/2021 No Yes ?K56.256 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 10/22/2021 ?Medical Record Number: [id number] ?Patient Account Number: [id number] ?Date of Birth/Sex: Treating RN: ?11-24-1955 (66 y.o. 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Modifier: ?Quantity: 4 ?Physician Procedures ?: CPT4 Code Description Modifier [id number] [phone] - WC PHYS HYPERBARIC OXYGEN THERAPY ICD-10 Diagnosis Description E11.621 Type 2 diabetes mellitus with foot ulcer 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

## 2021-10-22 NOTE — Progress Notes (Addendum)
[name], Colin A. ([id number]) ?Visit Report for 10/22/2021 ?HBO Details ?Patient Name: Date of Service: ?[name], [place] A. 10/22/2021 10:00 A M ?Medical Record Number: [id number] ?Patient Account Number: [id number] ?Date of Birth/Sex: Treating RN: ?27-Jan-1956 (66 y.o. M) [name], [name] ?Primary Care Neave Lenger: [name] ?Other Clinician: [name] ?Referring Reed Dady: ?Treating Emonee Winkowski/Extender: [name] ?[name] ?Weeks in Treatment: 8 ?HBO Treatment Course Details ?Treatment Course Number: 1 ?Ordering Willey Due: [name] ?T Treatments Ordered: ?otal 40 HBO Treatment Start Date: 09/20/2021 ?HBO Indication: ?Diabetic Ulcer(s) of the Lower Extremity ?Standard/Conservative Wound Care tried and failed greater than or equal to ?30 days ?Wound #1 Left, Medial Foot ?HBO Treatment Details ?Treatment Number: 24 ?Patient Type: Outpatient ?Chamber Type: Monoplace ?Chamber Serial #: [id number] ?Treatment Protocol: 2.5 ATA with 90 minutes oxygen, with two 5 minute air breaks ?Treatment Details ?Compression Rate Down: 2.0 psi / minute ?De-Compression Rate Up: 2.0 psi / minute ?A breaks and breathing ?ir ?Compress Tx Pressure periods Decompress Decompress ?Begins Reached (leave unused spaces Begins Ends ?blank) ?Chamber Pressure (ATA 1 2.5 2.5 2.5 2.5 2.5 - - 2.5 1 ?) ?Clock Time (24 hr) 09:42 09:53 10:24 10:29 10:59 11:04 - - 11:34 11:46 ?Treatment Length: 124 (minutes) ?Treatment Segments: 4 ?Vital Signs ?Capillary Blood Glucose Reference Range: 80 - 120 mg / dl ?HBO Diabetic Blood Glucose Intervention Range: <131 mg/dl or >249 mg/dl ?Time Vitals Blood Respiratory Capillary Blood Glucose Pulse Action ?Type: ?Pulse: Temperature: ?Taken: ?Pressure: ?Rate: ?Glucose (mg/dl): ?Meter #: Oximetry (%) Taken: ?Pre 09:36 139/82 93 18 98.1 167 ?Post 11:55 163/90 81 16 97.7 182 ?Treatment Response ?Treatment Toleration: Well ?Treatment Completion Status: Treatment Completed without Adverse Event ?Treatment Notes ?No problems  noted with today's treatment. ?Additional Procedure Documentation ?Tissue Sevierity: Necrosis of bone ?Physician HBO Attestation: ?I certify that I supervised this HBO treatment in accordance with Medicare ?guidelines. A trained emergency response team is readily available per Yes ?hospital policies and procedures. ?Continue HBOT as ordered. Yes ?Electronic Signature(s) ?Signed: 10/22/2021 12:59:29 PM By: [name] MD FACS ?Previous Signature: 10/22/2021 12:17:37 PM Version By: [name] EMT ?Entered By: [name] on 10/22/2021 12:59:28 ?-------------------------------------------------------------------------------- ?HBO Safety Checklist Details ?Patient Name: ?Date of Service: ?[name], [place] A. 10/22/2021 10:00 A M ?Medical Record Number: [id number] ?Patient Account Number: [id number] ?Date of Birth/Sex: ?Treating RN: ?1955-11-24 (66 y.o. M) [name], [name] ?Primary Care Charon Akamine: [name] ?Other Clinician: [name] ?Referring Bricelyn Freestone: ?Treating Giordano Getman/Extender: [name] ?[name] ?Weeks in Treatment: 8 ?HBO Safety Checklist Items ?Safety Checklist ?Consent Form Signed ?Patient voided / foley secured and emptied ?When did you last eato 0730 ?Last dose of injectable or oral agent 0730 ?Ostomy pouch emptied and vented if applicable ?NA ?All implantable devices assessed, documented and approved ?NA ?Intravenous access site secured and place ?NA ?Valuables secured ?Linens and cotton and cotton/polyester blend (less than 51% polyester) ?Personal oil-based products / skin lotions / body lotions removed ?Wigs or hairpieces removed ?NA ?Smoking or tobacco materials removed ?Books / newspapers / magazines / loose paper removed ?Cologne, aftershave, perfume and deodorant removed ?Jewelry removed (may wrap wedding band) ?NA ?Make-up removed ?NA ?Hair care products removed ?Battery operated devices (external) removed ?Heating patches and chemical warmers removed ?Titanium eyewear  removed ?NA ?Nail polish cured greater than 10 hours ?NA ?Casting material cured greater than 10 hours ?NA ?Hearing aids removed ?NA ?Loose dentures or partials removed ?NA ?Prosthetics have been removed ?NA ?Patient demonstrates correct use of air break device (if applicable) ?Patient concerns have  been addressed ?Patient grounding bracelet on and cord attached to chamber ?Specifics for Inpatients (complete in addition to above) ?Medication sheet sent with patient ?NA ?Intravenous medications needed or due during therapy sent with patient ?NA ?Drainage tubes (e.g. nasogastric tube or chest tube secured and vented) ?NA ?Endotracheal or Tracheotomy tube secured ?NA ?Cuff deflated of air and inflated with saline ?NA ?Airway suctioned ?NA ?Notes ?Paper version used prior to treatment. The safety checklist was done before treatment started. ?Electronic Signature(s) ?Signed: 10/22/2021 12:15:32 PM By: [name] EMT ?Entered By: [name] on 10/22/2021 12:15:32 ?

## 2021-10-22 NOTE — Progress Notes (Signed)
TANIELA, KOLESNIK A. ([id number]) ?Visit Report for 10/22/2021 ?Chief Complaint Document Details ?Patient Name: Date of Service: ?[name] 10/22/2021 12:45 PM ?Medical Record Number: [id number] ?Patient Account Number: [id number] ?Date of Birth/Sex: Treating RN: ?30-Nov-1955 (66 y.o. [name] ?Primary Care Provider: [name] Other Clinician: ?Referring Provider: ?Treating Provider/Extender: [name] ?[name] ?Weeks in Treatment: 8 ?Information Obtained from: Patient ?Chief Complaint ?Patients presents for treatment of an open diabetic ulcer with exposed bone and osteomyelitis ?Electronic Signature(s) ?Signed: 10/22/2021 1:22:40 PM By: [name] MD FACS ?Entered By: [name] on 10/22/2021 13:22:40 ?-------------------------------------------------------------------------------- ?Debridement Details ?Patient Name: Date of Service: ?[name] 10/22/2021 12:45 PM ?Medical Record Number: [id number] ?Patient Account Number: [id number] ?Date of Birth/Sex: Treating RN: ?1955-09-09 (66 y.o. M) [name], [name] ?Primary Care Provider: [name] Other Clinician: ?Referring Provider: ?Treating Provider/Extender: [name] ?[name] ?Weeks in Treatment: 8 ?Debridement Performed for Assessment: Wound #1 Left,Medial Foot ?Performed By: Physician [name], MD ?Debridement Type: Debridement ?Severity of Tissue Pre Debridement: Fat layer exposed ?Level of Consciousness (Pre-procedure): Awake and Alert ?Pre-procedure Verification/Time Out Yes - 13:00 ?Taken: ?Start Time: 13:01 ?Pain Control: ?Other : benzocaine 20% ?T Area Debrided (L x W): ?otal 6.1 (cm) x 4 (cm) = 24.4 (cm?) ?Tissue and other material debrided: ?Viable, Non-Viable, Bone, Slough, Subcutaneous, Skin: Dermis , Skin: Epidermis, Fibrin/Exudate, Slough ?Level: Skin/Subcutaneous Tissue/Muscle/Bone ?Debridement Description: Excisional ?Instrument: Curette ?Bleeding: Moderate ?Hemostasis Achieved: Pressure ?End  Time: 13:07 ?Procedural Pain: 0 ?Post Procedural Pain: 0 ?Response to Treatment: Procedure was tolerated well ?Level of Consciousness (Post- Awake and Alert ?procedure): ?Post Debridement Measurements of Total Wound ?Length: (cm) 6.1 ?Width: (cm) 4 ?Depth: (cm) 1 ?Volume: (cm?) 19.164 ?Character of Wound/Ulcer Post Debridement: Improved ?Severity of Tissue Post Debridement: Fat layer exposed ?Post Procedure Diagnosis ?Same as Pre-procedure ?Electronic Signature(s) ?Signed: 10/22/2021 1:56:49 PM By: [name] MD FACS ?Signed: 10/22/2021 2:09:01 PM By: Deon Pilling RN, BSN ?Entered By: Deon Pilling on 10/22/2021 13:09:42 ?-------------------------------------------------------------------------------- ?HPI Details ?Patient Name: Date of Service: ?[name] 10/22/2021 12:45 PM ?Medical Record Number: [id number] ?Patient Account Number: [id number] ?Date of Birth/Sex: Treating RN: ?1956/04/01 (66 y.o. [name] ?Primary Care Provider: [name] Other Clinician: ?Referring Provider: ?Treating Provider/Extender: [name] ?[name] ?Weeks in Treatment: 8 ?History of Present Illness ?HPI Description: ADMISSION ?08/25/2021 ?This is a 65 year old man who initially presented to his primary care provider in September 2022 with pain in his left foot. He was sent for an x-ray and while ?the x-ray was being performed, the tech pointed out a wound on his foot that the patient was not aware existed. He does have type 2 diabetes with significant ?neuropathy. His diabetes is suboptimally controlled with his most recent A1c being 8.5. He also has a history of coronary artery disease status post three- ?vessel CABG. he was initially seen by orthopedics, but they referred him to [facility]. He has undergone at least 7 ?operations/debridements and several applications of skin substitute under the care of podiatry. He has been in a wound VAC for much of this time. His most ?recent procedure  was July 28, 2021. A portion of the talus was biopsied and was found to be consistent with osteomyelitis. Culture also returned positive ?for corynebacterium. He was seen on August 16, 2021 by infectious disease. A PICC line has been placed and he will be receiving a 6-week course of IV ?daptomycin and cefepime. In October 2022, he underwent  lower extremity vascular studies. Results are copied here: ?Right: Resting right ankle-brachial index is within normal range. No ?evidence of significant right lower extremity arterial disease. The right ?toe-brachial index is abnormal. ?Left: Resting left ankle-brachial index indicates mild left lower ?extremity arterial disease. The left toe-brachial index is abnormal. ?He has not been seen by vascular surgery despite these findings. ?He presented to clinic today in a cam boot and is using a knee scooter to offload. Wound VAC was in place. Once this was removed, a large ulcer was ?identified on the left midfoot/ankle. Bone is frankly exposed. There is no malodorous or purulent drainage. There is some granulation tissue over the central ?portion of the exposed bone. There is a tunnel that extends posteriorly for roughly 10 cm. ?It has been discussed with him by multiple providers that he is at very high risk of losing his lower leg because of this wound. He is extremely eager to avoid ?this outcome and is here today to review his options as well as receive ongoing wound care. ?09/03/2021: Here for reevaluation of his wound. There does not appear to have been any substantial improvement overall since our last visit. He has been in a ?wound VAC with white foam overlying the exposed bone. We are working on getting him approved for hyperbaric oxygen therapy. ?09/10/2021: We are in the process of getting him cleared to begin hyperbaric oxygen therapy. He still needs to obtain a chest x-ray. Although the wound ?measurements are roughly the same, I think the overall appearance of the  wound is better. The exposed bone has a bit more granulation tissue covering it. He ?has not received a vascular surgery appointment to reevaluate his flow to the wound. ?09/17/2021: He has been approved for hyperbaric oxygen therapy and completed his chest x-ray, which I reviewed and it appears normal. The tunnels at the 12 ?and 10:00 positions are smaller. There is more granulation tissue covering the exposed bone and the undermining has decreased. He still has not received a ?vascular surgery appointment. ?09/24/2021: He initiated hyperbaric oxygen therapy this week and is tolerating it well. He has an appointment with vascular surgery coming up on May 16. The ?granulation tissue is covering more of the exposed bone and both tunnels are a bit smaller. ?10/01/2021: He continues to tolerate hyperbaric oxygen therapy. He saw infectious disease and they are planning to pull his PICC line. He has been initiated on ?oral antibiotics (doxycycline and Augmentin). The wound looks about the same but the tunnels are a little bit smaller. The skin seems to be contracting ?somewhat around the exposed bone. ?10/08/2021: The wound is still about the same size, but the tunnels continue to come in and the skin is contracting around the exposed bone. He continues to ?have some accumulation of necrotic material in the inferoposterior aspect of the wound as well as accumulation at the 12:00 tunnel area. ?10/15/2021: The wound is smaller today. The tunnels continue to come in. There is less necrotic tissue present. He does have some periwound maceration. ?10/22/2021: The wound is about the same size. There is a little bit less undermining at the distal portion. The exposed bone is dark and I am not sure if this is ?staining from silver nitrate or his VAC sponge or if it represents necrosis. The tunnels are shallower but he does have some serous drainage coming from the ?10:00 tunnel. He continues to tolerate hyperbaric oxygen therapy  well. ?Electronic Signature(s) ?Signed: 10/22/2021 1:23:55 PM By: [name] MD  FACS ?Entered By: [name] on 10/22/2021 13:23:55 ?------------------------------------------------------------------

## 2021-10-22 NOTE — Progress Notes (Signed)
[name], Colin A. ([id number]) ?Visit Report for 10/22/2021 ?Arrival Information Details ?Patient Name: Date of Service: ?[name], [name] A. 10/22/2021 10:00 A M ?Medical Record Number: [id number] ?Patient Account Number: [id number] ?Date of Birth/Sex: Treating RN: ?[birth year] (66 y.o. M) [name], [name] ?Primary Care Kelwin Gibler: [name] ?Other Clinician: [name] ?Referring Filemon Breton: ?Treating Illona Bulman/Extender: [name] ?[name] ?Weeks in Treatment: 8 ?Visit Information History Since Last Visit ?All ordered tests and consults were completed: Yes ?Patient Arrived: Knee Scooter ?Added or deleted any medications: No ?Arrival Time: 09:21 ?Any new allergies or adverse reactions: No ?Accompanied By: Wife ?Had a fall or experienced change in No ?Transfer Assistance: None ?activities of daily living that may affect ?Patient Identification Verified: Yes ?risk of falls: ?Secondary Verification Process Completed: Yes ?Signs or symptoms of abuse/neglect since last visito No ?Patient Requires Transmission-Based Precautions: No ?Hospitalized since last visit: No ?Patient Has Alerts: Yes ?Implantable device outside of the clinic excluding No ?Patient Alerts: Patient on Blood Thinner cellular tissue based products placed in the center ?since last visit: ?Pain Present Now: No ?Electronic Signature(s) ?Signed: 10/22/2021 12:13:32 PM By: [name] EMT ?Entered By: [name] on 10/22/2021 12:13:32 ?-------------------------------------------------------------------------------- ?Encounter Discharge Information Details ?Patient Name: Date of Service: ?[name], [name] A. 10/22/2021 10:00 A M ?Medical Record Number: [id number] ?Patient Account Number: [id number] ?Date of Birth/Sex: Treating RN: ?[birth year] (66 y.o. M) [name], [name] ?Primary Care Teale Goodgame: [name] ?Other Clinician: [name] ?Referring Camora Tremain: ?Treating Valma Rotenberg/Extender: [name] ?[name] ?Weeks in Treatment: 8 ?Encounter  Discharge Information Items ?Discharge Condition: Stable ?Ambulatory Status: Knee Scooter ?Discharge Destination: Home ?Transportation: Private Auto ?Accompanied By: Wife ?Schedule Follow-up Appointment: Yes ?Clinical Summary of Care: ?Electronic Signature(s) ?Signed: 10/22/2021 12:19:22 PM By: [name] EMT ?Entered By: [name] on 10/22/2021 12:19:22 ?-------------------------------------------------------------------------------- ?Vitals Details ?Patient Name: ?Date of Service: ?[name], [name] A. 10/22/2021 10:00 A M ?Medical Record Number: [id number] ?Patient Account Number: [id number] ?Date of Birth/Sex: ?Treating RN: ?May 08, 1956 (66 y.o. M) [name], [name] ?Primary Care Tychelle Purkey: [name] ?Other Clinician: [name] ?Referring Malana Eberwein: ?Treating Lillia Lengel/Extender: [name] ?[name] ?Weeks in Treatment: 8 ?Vital Signs ?Time Taken: 09:36 ?Temperature (??F): 98.1 ?Height (in): 74 ?Pulse (bpm): 93 ?Weight (lbs): 186 ?Respiratory Rate (breaths/min): 18 ?Body Mass Index (BMI): 23.9 ?Blood Pressure (mmHg): 139/82 ?Capillary Blood Glucose (mg/dl): 620 ?Reference Range: 80 - 120 mg / dl ?Electronic Signature(s) ?Signed: 10/22/2021 12:14:15 PM By: [name] EMT ?Entered By: [name] on 10/22/2021 12:14:15 ?

## 2021-10-23 DIAGNOSIS — T8131XA Disruption of external operation (surgical) wound, not elsewhere classified, initial encounter: Secondary | ICD-10-CM | POA: Diagnosis not present

## 2021-10-24 DIAGNOSIS — T8131XA Disruption of external operation (surgical) wound, not elsewhere classified, initial encounter: Secondary | ICD-10-CM | POA: Diagnosis not present

## 2021-10-25 ENCOUNTER — Encounter (HOSPITAL_BASED_OUTPATIENT_CLINIC_OR_DEPARTMENT_OTHER): Payer: BC Managed Care – PPO | Admitting: General Surgery

## 2021-10-25 DIAGNOSIS — Z951 Presence of aortocoronary bypass graft: Secondary | ICD-10-CM | POA: Diagnosis not present

## 2021-10-25 DIAGNOSIS — K219 Gastro-esophageal reflux disease without esophagitis: Secondary | ICD-10-CM | POA: Diagnosis not present

## 2021-10-25 DIAGNOSIS — Z792 Long term (current) use of antibiotics: Secondary | ICD-10-CM | POA: Diagnosis not present

## 2021-10-25 DIAGNOSIS — E11621 Type 2 diabetes mellitus with foot ulcer: Secondary | ICD-10-CM | POA: Diagnosis not present

## 2021-10-25 DIAGNOSIS — L97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: Secondary | ICD-10-CM | POA: Diagnosis not present

## 2021-10-25 DIAGNOSIS — E11610 Type 2 diabetes mellitus with diabetic neuropathic arthropathy: Secondary | ICD-10-CM | POA: Diagnosis not present

## 2021-10-25 DIAGNOSIS — Z7984 Long term (current) use of oral hypoglycemic drugs: Secondary | ICD-10-CM | POA: Diagnosis not present

## 2021-10-25 DIAGNOSIS — L97522 Non-pressure chronic ulcer of other part of left foot with fat layer exposed: Secondary | ICD-10-CM | POA: Diagnosis not present

## 2021-10-25 DIAGNOSIS — T8131XA Disruption of external operation (surgical) wound, not elsewhere classified, initial encounter: Secondary | ICD-10-CM | POA: Diagnosis not present

## 2021-10-25 DIAGNOSIS — E785 Hyperlipidemia, unspecified: Secondary | ICD-10-CM | POA: Diagnosis not present

## 2021-10-25 DIAGNOSIS — E1140 Type 2 diabetes mellitus with diabetic neuropathy, unspecified: Secondary | ICD-10-CM | POA: Diagnosis not present

## 2021-10-25 DIAGNOSIS — D649 Anemia, unspecified: Secondary | ICD-10-CM | POA: Diagnosis not present

## 2021-10-25 DIAGNOSIS — Z794 Long term (current) use of insulin: Secondary | ICD-10-CM | POA: Diagnosis not present

## 2021-10-25 DIAGNOSIS — I10 Essential (primary) hypertension: Secondary | ICD-10-CM | POA: Diagnosis not present

## 2021-10-25 DIAGNOSIS — M1990 Unspecified osteoarthritis, unspecified site: Secondary | ICD-10-CM | POA: Diagnosis not present

## 2021-10-25 DIAGNOSIS — I2510 Atherosclerotic heart disease of native coronary artery without angina pectoris: Secondary | ICD-10-CM | POA: Diagnosis not present

## 2021-10-25 DIAGNOSIS — M86672 Other chronic osteomyelitis, left ankle and foot: Secondary | ICD-10-CM | POA: Diagnosis not present

## 2021-10-25 DIAGNOSIS — I252 Old myocardial infarction: Secondary | ICD-10-CM | POA: Diagnosis not present

## 2021-10-25 DIAGNOSIS — Z7982 Long term (current) use of aspirin: Secondary | ICD-10-CM | POA: Diagnosis not present

## 2021-10-25 DIAGNOSIS — Z452 Encounter for adjustment and management of vascular access device: Secondary | ICD-10-CM | POA: Diagnosis not present

## 2021-10-25 DIAGNOSIS — E1169 Type 2 diabetes mellitus with other specified complication: Secondary | ICD-10-CM | POA: Diagnosis not present

## 2021-10-25 LAB — GLUCOSE, CAPILLARY
Glucose-Capillary: 171 mg/dL — ABNORMAL HIGH (ref 70–99)
Glucose-Capillary: 179 mg/dL — ABNORMAL HIGH (ref 70–99)

## 2021-10-25 NOTE — Progress Notes (Addendum)
MICCO, GOODFELLOW A. ([id number]) ?Visit Report for 10/25/2021 ?HBO Details ?Patient Name: Date of Service: ?[name], Hol[place] A. 10/25/2021 10:00 A M ?Medical Record Number: [id number] ?Patient Account Number: [id number] ?Date of Birth/Sex: Treating RN: ?[birth year] (66 y.o. [name] ?Primary Care Marcellino Fidalgo: [name] ?Other Clinician: [name] ?Referring Neviah Braud: ?Treating Vishaal Strollo/Extender: [name] ?[name] ?Weeks in Treatment: 8 ?HBO Treatment Course Details ?Treatment Course Number: 1 ?Ordering Nohea Kras: [name] ?T Treatments Ordered: ?otal 40 HBO Treatment Start Date: 09/20/2021 ?HBO Indication: ?Diabetic Ulcer(s) of the Lower Extremity ?Standard/Conservative Wound Care tried and failed greater than or equal to ?30 days ?Wound #1 Left, Medial Foot ?HBO Treatment Details ?Treatment Number: 25 ?Patient Type: Outpatient ?Chamber Type: Monoplace ?Chamber Serial #: [id number] ?Treatment Protocol: 2.5 ATA with 90 minutes oxygen, with two 5 minute air breaks ?Treatment Details ?Compression Rate Down: 2.0 psi / minute ?De-Compression Rate Up: 2.0 psi / minute ?A breaks and breathing ?ir ?Compress Tx Pressure periods Decompress Decompress ?Begins Reached (leave unused spaces Begins Ends ?blank) ?Chamber Pressure (ATA 1 2.5 2.5 2.5 2.5 2.5 - - 2.5 1 ?) ?Clock Time (24 hr) 09:46 09:59 10:28 10:33 11:04 11:09 - - 11:39 11:52 ?Treatment Length: 126 (minutes) ?Treatment Segments: 4 ?Vital Signs ?Capillary Blood Glucose Reference Range: 80 - 120 mg / dl ?HBO Diabetic Blood Glucose Intervention Range: <131 mg/dl or >249 mg/dl ?Time Vitals Blood Respiratory Capillary Blood Glucose Pulse Action ?Type: ?Pulse: Temperature: ?Taken: ?Pressure: ?Rate: ?Glucose (mg/dl): ?Meter #: Oximetry (%) Taken: ?Pre 09:41 110/77 83 16 97.6 171 ?Post 11:53 150/86 77 16 97.6 179 ?Treatment Response ?Treatment Toleration: Well ?Treatment Completion Status: Treatment Completed without Adverse Event ?Additional Procedure  Documentation ?Tissue Sevierity: Necrosis of bone ?Physician HBO Attestation: ?I certify that I supervised this HBO treatment in accordance with Medicare ?guidelines. A trained emergency response team is readily available per Yes ?hospital policies and procedures. ?Continue HBOT as ordered. Yes ?Electronic Signature(s) ?Signed: 10/25/2021 5:05:42 PM By: [name] MD FACS ?Previous Signature: 10/25/2021 2:17:46 PM Version By: [name] EMT ?Entered By: [name] on 10/25/2021 17:05:42 ?-------------------------------------------------------------------------------- ?HBO Safety Checklist Details ?Patient Name: ?Date of Service: ?[name], Sam [place] A. 10/25/2021 10:00 A M ?Medical Record Number: [id number] ?Patient Account Number: [id number] ?Date of Birth/Sex: ?Treating RN: ?10-05-1955 (66 y.o. [name] ?Primary Care Burhanuddin Kohlmann: [name] ?Other Clinician: [name] ?Referring Geofrey Silliman: ?Treating [name] Un/Extender: [name] ?[name] ?Weeks in Treatment: 8 ?HBO Safety Checklist Items ?Safety Checklist ?Consent Form Signed ?Patient voided / foley secured and emptied ?When did you last eato 0715 ?Last dose of injectable or oral agent 0715 ?Ostomy pouch emptied and vented if applicable ?NA ?All implantable devices assessed, documented and approved ?NA ?Intravenous access site secured and place ?NA ?Valuables secured ?Linens and cotton and cotton/polyester blend (less than 51% polyester) ?Personal oil-based products / skin lotions / body lotions removed ?Wigs or hairpieces removed ?NA ?Smoking or tobacco materials removed ?Books / newspapers / magazines / loose paper removed ?Cologne, aftershave, perfume and deodorant removed ?Jewelry removed (may wrap wedding band) ?NA ?Make-up removed ?Hair care products removed ?Battery operated devices (external) removed ?Heating patches and chemical warmers removed ?Titanium eyewear removed ?NA ?Nail polish cured greater than 10 hours ?NA ?Casting  material cured greater than 10 hours ?NA ?Hearing aids removed ?NA ?Loose dentures or partials removed ?NA ?Prosthetics have been removed ?NA ?Patient demonstrates correct use of air break device (if applicable) ?Patient concerns have been addressed ?Patient grounding bracelet on and cord attached  to chamber ?Specifics for Inpatients (complete in addition to above) ?Medication sheet sent with patient ?NA ?Intravenous medications needed or due during therapy sent with patient ?NA ?Drainage tubes (e.g. nasogastric tube or chest tube secured and vented) ?NA ?Endotracheal or Tracheotomy tube secured ?NA ?Cuff deflated of air and inflated with saline ?NA ?Airway suctioned ?NA ?Notes ?Paper version used prior to treatment. The safety checklist was done before treatment start. ?Electronic Signature(s) ?Signed: 10/25/2021 2:15:19 PM By: [name] EMT ?Entered By: [name] on 10/25/2021 14:15:18 ?

## 2021-10-25 NOTE — Progress Notes (Addendum)
KENWOOD, ROSIAK A. ([id number]) ?Visit Report for 10/25/2021 ?Arrival Information Details ?Patient Name: Date of Service: ?[name], [name] A. 10/25/2021 10:00 A M ?Medical Record Number: [id number] ?Patient Account Number: [id number] ?Date of Birth/Sex: Treating RN: ?07-May-1956 (66 y.o. [name] ?Primary Care Rosamund Nyland: [name] ?Other Clinician: [name] ?Referring Jahi Roza: ?Treating Atanacio Melnyk/Extender: [name] ?[name] ?Weeks in Treatment: 8 ?Visit Information History Since Last Visit ?All ordered tests and consults were completed: Yes ?Patient Arrived: Knee Scooter ?Pain Present Now: No ?Arrival Time: 09:26 ?Accompanied By: Wife ?Transfer Assistance: None ?Patient Identification Verified: Yes ?Secondary Verification Process Completed: Yes ?Patient Requires Transmission-Based Precautions: No ?Patient Has Alerts: Yes ?Patient Alerts: Patient on Blood Thinner ?Electronic Signature(s) ?Signed: 10/25/2021 2:13:17 PM By: [name] EMT ?Entered By: [name] on 10/25/2021 14:13:17 ?-------------------------------------------------------------------------------- ?Encounter Discharge Information Details ?Patient Name: Date of Service: ?[name], [name] A. 10/25/2021 10:00 A M ?Medical Record Number: [id number] ?Patient Account Number: [id number] ?Date of Birth/Sex: Treating RN: ?[birth year] (66 y.o. [name] ?Primary Care Mindi Akerson: [name] ?Other Clinician: [name] ?Referring Crissie Aloi: ?Treating Prisma Decarlo/Extender: [name] ?[name] ?Weeks in Treatment: 8 ?Encounter Discharge Information Items ?Discharge Condition: Stable ?Ambulatory Status: Knee Scooter ?Discharge Destination: Home ?Transportation: Private Auto ?Accompanied By: Wife ?Schedule Follow-up Appointment: No ?Clinical Summary of Care: ?Electronic Signature(s) ?Signed: 10/25/2021 2:25:04 PM By: [name] EMT ?Entered By: [name] on 10/25/2021  14:25:04 ?-------------------------------------------------------------------------------- ?Vitals Details ?Patient Name: ?Date of Service: ?[name], [name] A. 10/25/2021 10:00 A M ?Medical Record Number: [id number] ?Patient Account Number: [id number] ?Date of Birth/Sex: ?Treating RN: ?[birth year] (66 y.o. [name] ?Primary Care Kien Mirsky: [name] ?Other Clinician: [name] ?Referring Anquanette Bahner: ?Treating Treyshaun Keatts/Extender: [name] ?[name] ?Weeks in Treatment: 8 ?Vital Signs ?Time Taken: 09:41 ?Temperature (??F): 97.6 ?Height (in): 74 ?Pulse (bpm): 83 ?Weight (lbs): 186 ?Respiratory Rate (breaths/min): 16 ?Body Mass Index (BMI): 23.9 ?Blood Pressure (mmHg): 110/77 ?Capillary Blood Glucose (mg/dl): 497 ?Reference Range: 80 - 120 mg / dl ?Electronic Signature(s) ?Signed: 10/25/2021 2:13:49 PM By: [name] EMT ?Entered By: [name] on 10/25/2021 14:13:49 ?

## 2021-10-25 NOTE — Progress Notes (Signed)
ZACARIAH, BELUE A. ([id number]) ?Visit Report for 10/25/2021 ?Problem List Details ?Patient Name: Date of Service: ?[name], [name] 10/25/2021 10:00 A M ?Medical Record Number: [id number] ?Patient Account Number: [id number] ?Date of Birth/Sex: Treating RN: ?1955-12-02 (66 y.o. 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 08/25/2021 No Yes ?E11.621 Type 2 diabetes mellitus with foot ulcer 08/25/2021 No Yes ?I25.10 Atherosclerotic heart disease of native coronary artery without angina pectoris 08/25/2021 No Yes ?H85.277 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 10/25/2021 ?Medical Record Number: [id number] ?Patient Account Number: [id number] ?Date of Birth/Sex: Treating RN: ?November 15, 1955 (66 y.o. 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Modifier: ?Quantity: 4 ?Physician Procedures ?: CPT4 Code Description Modifier [id number] [phone] - WC PHYS HYPERBARIC OXYGEN THERAPY ICD-10 Diagnosis Description E11.621 Type 2 diabetes mellitus with foot ulcer 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

## 2021-10-26 ENCOUNTER — Encounter: Payer: BC Managed Care – PPO | Admitting: Vascular Surgery

## 2021-10-26 ENCOUNTER — Encounter (HOSPITAL_COMMUNITY): Payer: BC Managed Care – PPO

## 2021-10-26 ENCOUNTER — Encounter (HOSPITAL_BASED_OUTPATIENT_CLINIC_OR_DEPARTMENT_OTHER): Payer: BC Managed Care – PPO | Admitting: General Surgery

## 2021-10-26 DIAGNOSIS — Z951 Presence of aortocoronary bypass graft: Secondary | ICD-10-CM | POA: Diagnosis not present

## 2021-10-26 DIAGNOSIS — M86672 Other chronic osteomyelitis, left ankle and foot: Secondary | ICD-10-CM | POA: Diagnosis not present

## 2021-10-26 DIAGNOSIS — E11610 Type 2 diabetes mellitus with diabetic neuropathic arthropathy: Secondary | ICD-10-CM | POA: Diagnosis not present

## 2021-10-26 DIAGNOSIS — E1169 Type 2 diabetes mellitus with other specified complication: Secondary | ICD-10-CM | POA: Diagnosis not present

## 2021-10-26 DIAGNOSIS — I2510 Atherosclerotic heart disease of native coronary artery without angina pectoris: Secondary | ICD-10-CM | POA: Diagnosis not present

## 2021-10-26 DIAGNOSIS — E11621 Type 2 diabetes mellitus with foot ulcer: Secondary | ICD-10-CM | POA: Diagnosis not present

## 2021-10-26 DIAGNOSIS — L97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: Secondary | ICD-10-CM | POA: Diagnosis not present

## 2021-10-26 DIAGNOSIS — T8131XA Disruption of external operation (surgical) wound, not elsewhere classified, initial encounter: Secondary | ICD-10-CM | POA: Diagnosis not present

## 2021-10-26 LAB — GLUCOSE, CAPILLARY
Glucose-Capillary: 170 mg/dL — ABNORMAL HIGH (ref 70–99)
Glucose-Capillary: 241 mg/dL — ABNORMAL HIGH (ref 70–99)

## 2021-10-26 NOTE — Progress Notes (Signed)
[name], [name]. ([id number]) ?Visit Report for 10/26/2021 ?Problem List Details ?Patient Name: Date of Service: ?[name], [name]. 10/26/2021 10:00 [name] ?Medical Record Number: [id number] ?Patient Account Number: [id number] ?Date of Birth/Sex: Treating RN: ?1955/07/22 (66 y.o. 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 08/25/2021 No Yes ?E11.621 Type 2 diabetes mellitus with foot ulcer 08/25/2021 No Yes ?I25.10 Atherosclerotic heart disease of native coronary artery without angina pectoris 08/25/2021 No Yes ?[id number] 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 10/26/2021 ?Medical Record Number: [id number] ?Patient Account Number: [id number] ?Date of Birth/Sex: Treating RN: ?[birth year] (66 y.o. 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 ?Facility Procedures ?CPT4 Code: [id number] ?Description: G0277-(Facility Use Only) HBOT full body chamber, 76min , ICD-10 Diagnosis Description E11.621 Type 2 diabetes mellitus with foot ulcer 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 M86.672 Other chronic  ?osteomyelitis, left ankle and foot 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 ?Modifier: ?Quantity: 4 ?Physician Procedures ?: CPT4 Code Description Modifier [id number] - WC PHYS HYPERBARIC OXYGEN THERAPY ICD-10 Diagnosis Description E11.621 Type 2 diabetes mellitus with foot ulcer 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

## 2021-10-27 ENCOUNTER — Encounter (HOSPITAL_BASED_OUTPATIENT_CLINIC_OR_DEPARTMENT_OTHER): Payer: BC Managed Care – PPO | Admitting: General Surgery

## 2021-10-27 DIAGNOSIS — Z7982 Long term (current) use of aspirin: Secondary | ICD-10-CM | POA: Diagnosis not present

## 2021-10-27 DIAGNOSIS — T8131XA Disruption of external operation (surgical) wound, not elsewhere classified, initial encounter: Secondary | ICD-10-CM | POA: Diagnosis not present

## 2021-10-27 DIAGNOSIS — Z792 Long term (current) use of antibiotics: Secondary | ICD-10-CM | POA: Diagnosis not present

## 2021-10-27 DIAGNOSIS — E11621 Type 2 diabetes mellitus with foot ulcer: Secondary | ICD-10-CM | POA: Diagnosis not present

## 2021-10-27 DIAGNOSIS — I10 Essential (primary) hypertension: Secondary | ICD-10-CM | POA: Diagnosis not present

## 2021-10-27 DIAGNOSIS — K219 Gastro-esophageal reflux disease without esophagitis: Secondary | ICD-10-CM | POA: Diagnosis not present

## 2021-10-27 DIAGNOSIS — I252 Old myocardial infarction: Secondary | ICD-10-CM | POA: Diagnosis not present

## 2021-10-27 DIAGNOSIS — M86672 Other chronic osteomyelitis, left ankle and foot: Secondary | ICD-10-CM | POA: Diagnosis not present

## 2021-10-27 DIAGNOSIS — I2510 Atherosclerotic heart disease of native coronary artery without angina pectoris: Secondary | ICD-10-CM | POA: Diagnosis not present

## 2021-10-27 DIAGNOSIS — Z452 Encounter for adjustment and management of vascular access device: Secondary | ICD-10-CM | POA: Diagnosis not present

## 2021-10-27 DIAGNOSIS — L97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: Secondary | ICD-10-CM | POA: Diagnosis not present

## 2021-10-27 DIAGNOSIS — E1140 Type 2 diabetes mellitus with diabetic neuropathy, unspecified: Secondary | ICD-10-CM | POA: Diagnosis not present

## 2021-10-27 DIAGNOSIS — E1169 Type 2 diabetes mellitus with other specified complication: Secondary | ICD-10-CM | POA: Diagnosis not present

## 2021-10-27 DIAGNOSIS — E11610 Type 2 diabetes mellitus with diabetic neuropathic arthropathy: Secondary | ICD-10-CM | POA: Diagnosis not present

## 2021-10-27 DIAGNOSIS — Z7984 Long term (current) use of oral hypoglycemic drugs: Secondary | ICD-10-CM | POA: Diagnosis not present

## 2021-10-27 DIAGNOSIS — Z951 Presence of aortocoronary bypass graft: Secondary | ICD-10-CM | POA: Diagnosis not present

## 2021-10-27 DIAGNOSIS — E785 Hyperlipidemia, unspecified: Secondary | ICD-10-CM | POA: Diagnosis not present

## 2021-10-27 DIAGNOSIS — D649 Anemia, unspecified: Secondary | ICD-10-CM | POA: Diagnosis not present

## 2021-10-27 DIAGNOSIS — L97522 Non-pressure chronic ulcer of other part of left foot with fat layer exposed: Secondary | ICD-10-CM | POA: Diagnosis not present

## 2021-10-27 DIAGNOSIS — M1990 Unspecified osteoarthritis, unspecified site: Secondary | ICD-10-CM | POA: Diagnosis not present

## 2021-10-27 DIAGNOSIS — Z794 Long term (current) use of insulin: Secondary | ICD-10-CM | POA: Diagnosis not present

## 2021-10-27 LAB — GLUCOSE, CAPILLARY
Glucose-Capillary: 227 mg/dL — ABNORMAL HIGH (ref 70–99)
Glucose-Capillary: 195 mg/dL — ABNORMAL HIGH (ref 70–99)

## 2021-10-27 NOTE — Progress Notes (Signed)
[name], [name] ([id number]) ?Visit Report for 10/27/2021 ?Problem List Details ?Patient Name: Date of Service: ?[name], [name] 10/27/2021 10:00 A M ?Medical Record Number: [id number] ?Patient Account Number: [id number] ?Date of Birth/Sex: Treating RN: ?04-Sep-1955 (66 y.o. 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 08/25/2021 No Yes ?E11.621 Type 2 diabetes mellitus with foot ulcer 08/25/2021 No Yes ?I25.10 Atherosclerotic heart disease of native coronary artery without angina pectoris 08/25/2021 No Yes ?F81.017 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 10/27/2021 ?Medical Record Number: [id number] ?Patient Account Number: [id number] ?Date of Birth/Sex: Treating RN: ?08-17-1955 (66 y.o. 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Modifier: ?Quantity: 4 ?Physician Procedures ?: CPT4 Code Description Modifier [id number] [phone] - WC PHYS HYPERBARIC OXYGEN THERAPY ICD-10 Diagnosis Description E11.621 Type 2 diabetes mellitus with foot ulcer 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

## 2021-10-27 NOTE — Progress Notes (Addendum)
KONRAD, DESAI A. ([id number]) ?Visit Report for 10/27/2021 ?HBO Details ?Patient Name: Date of Service: ?[name], [place] A. 10/27/2021 10:00 A M ?Medical Record Number: [id number] ?Patient Account Number: [id number] ?Date of Birth/Sex: Treating RN: ?[birth year] (66 y.o. [name] ?Primary Care Shantay Sonn: [name] ?Other Clinician: [name] ?Referring Alegandra Sommers: ?Treating Earnestene Angello/Extender: [name] ?[name] ?Weeks in Treatment: 9 ?HBO Treatment Course Details ?Treatment Course Number: 1 ?Ordering Namiyah Grantham: [name] ?T Treatments Ordered: ?otal 40 HBO Treatment Start Date: 09/20/2021 ?HBO Indication: ?Diabetic Ulcer(s) of the Lower Extremity ?Standard/Conservative Wound Care tried and failed greater than or equal to ?30 days ?Wound #1 Left, Medial Foot ?HBO Treatment Details ?Treatment Number: 27 ?Patient Type: Outpatient ?Chamber Type: Monoplace ?Chamber Serial #: [id number] ?Treatment Protocol: 2.5 ATA with 90 minutes oxygen, with two 5 minute air breaks ?Treatment Details ?Compression Rate Down: 2.0 psi / minute ?De-Compression Rate Up: 2.0 psi / minute ?A breaks and breathing ?ir ?Compress Tx Pressure periods Decompress Decompress ?Begins Reached (leave unused spaces Begins Ends ?blank) ?Chamber Pressure (ATA 1 2.5 2.5 2.5 2.5 2.5 - - 2.5 1 ?) ?Clock Time (24 hr) 09:48 09:59 10:29 10:34 11:04 11:09 - - 11:39 11:50 ?Treatment Length: 122 (minutes) ?Treatment Segments: 4 ?Vital Signs ?Capillary Blood Glucose Reference Range: 80 - 120 mg / dl ?HBO Diabetic Blood Glucose Intervention Range: <131 mg/dl or >249 mg/dl ?Time Vitals Blood Respiratory Capillary Blood Glucose Pulse Action ?Type: ?Pulse: Temperature: ?Taken: ?Pressure: ?Rate: ?Glucose (mg/dl): ?Meter #: Oximetry (%) Taken: ?Pre 09:40 114/92 87 16 97.7 227 ?Post 11:52 158/97 72 18 97.3 195 ?Treatment Response ?Treatment Toleration: Well ?Treatment Completion Status: Treatment Completed without Adverse Event ?Additional Procedure  Documentation ?Tissue Sevierity: Necrosis of bone ?Physician HBO Attestation: ?I certify that I supervised this HBO treatment in accordance with Medicare ?guidelines. A trained emergency response team is readily available per Yes ?hospital policies and procedures. ?Continue HBOT as ordered. Yes ?Electronic Signature(s) ?Signed: 10/27/2021 2:54:00 PM By: [name] MD FACS ?Previous Signature: 10/27/2021 1:36:50 PM Version By: [name] EMT ?Entered By: [name] on 10/27/2021 14:54:00 ?-------------------------------------------------------------------------------- ?HBO Safety Checklist Details ?Patient Name: ?Date of Service: ?[name], [place] A. 10/27/2021 10:00 A M ?Medical Record Number: [id number] ?Patient Account Number: [id number] ?Date of Birth/Sex: ?Treating RN: ?June 07, 1956 (66 y.o. [name] ?Primary Care Rion Catala: [name] ?Other Clinician: [name] ?Referring Jayion Schneck: ?Treating Makai Dumond/Extender: [name] ?[name] ?Weeks in Treatment: 9 ?HBO Safety Checklist Items ?Safety Checklist ?Consent Form Signed ?Patient voided / foley secured and emptied ?When did you last eato 0700 ?Last dose of injectable or oral agent 0700 ?Ostomy pouch emptied and vented if applicable ?NA ?All implantable devices assessed, documented and approved ?NA ?Intravenous access site secured and place ?NA ?Valuables secured ?Linens and cotton and cotton/polyester blend (less than 51% polyester) ?Personal oil-based products / skin lotions / body lotions removed ?Wigs or hairpieces removed ?NA ?Smoking or tobacco materials removed ?Books / newspapers / magazines / loose paper removed ?Cologne, aftershave, perfume and deodorant removed ?Jewelry removed (may wrap wedding band) ?NA ?Make-up removed ?NA ?Hair care products removed ?Battery operated devices (external) removed ?Heating patches and chemical warmers removed ?Titanium eyewear removed ?NA ?Nail polish cured greater than 10  hours ?NA ?Casting material cured greater than 10 hours ?NA ?Hearing aids removed ?NA ?Loose dentures or partials removed ?NA ?Prosthetics have been removed ?NA ?Patient demonstrates correct use of air break device (if applicable) ?Patient concerns have been addressed ?Patient grounding bracelet on and cord  attached to chamber ?Specifics for Inpatients (complete in addition to above) ?Medication sheet sent with patient ?NA ?Intravenous medications needed or due during therapy sent with patient ?NA ?Drainage tubes (e.g. nasogastric tube or chest tube secured and vented) ?NA ?Endotracheal or Tracheotomy tube secured ?NA ?Cuff deflated of air and inflated with saline ?NA ?Airway suctioned ?NA ?Notes ?Paper version used prior to treatment. The safety checklist was done before treatment was started. ?Electronic Signature(s) ?Signed: 10/27/2021 1:35:34 PM By: [name] EMT ?Entered By: [name] on 10/27/2021 13:35:34 ?

## 2021-10-27 NOTE — Progress Notes (Addendum)
THELBERT, HOLSTEN A. ([id number]) ?Visit Report for 10/27/2021 ?Arrival Information Details ?Patient Name: Date of Service: ?[name], [place] A. 10/27/2021 10:00 A M ?Medical Record Number: [id number] ?Patient Account Number: [id number] ?Date of Birth/Sex: Treating RN: ?1956/05/30 (66 y.o. [name] ?Primary Care Deandra Goering: [name] ?Other Clinician: [name] ?Referring Shamarr Faucett: ?Treating Joshlynn Alfonzo/Extender: [name] ?[name] ?Weeks in Treatment: 9 ?Visit Information History Since Last Visit ?All ordered tests and consults were completed: Yes ?Patient Arrived: Knee Scooter ?Added or deleted any medications: No ?Arrival Time: 09:23 ?Any new allergies or adverse reactions: No ?Accompanied By: Wife ?Had a fall or experienced change in No ?Transfer Assistance: None ?activities of daily living that may affect ?Patient Identification Verified: Yes ?risk of falls: ?Secondary Verification Process Completed: Yes ?Signs or symptoms of abuse/neglect since last visito No ?Patient Requires Transmission-Based Precautions: No ?Hospitalized since last visit: No ?Patient Has Alerts: Yes ?Implantable device outside of the clinic excluding No ?Patient Alerts: Patient on Blood Thinner cellular tissue based products placed in the center ?since last visit: ?Pain Present Now: No ?Electronic Signature(s) ?Signed: 10/27/2021 1:32:51 PM By: [name] EMT ?Entered By: [name] on 10/27/2021 13:32:51 ?-------------------------------------------------------------------------------- ?Encounter Discharge Information Details ?Patient Name: Date of Service: ?[name], [place] A. 10/27/2021 10:00 A M ?Medical Record Number: [id number] ?Patient Account Number: [id number] ?Date of Birth/Sex: Treating RN: ?[birth year] (66 y.o. [name] ?Primary Care Sanah Kraska: [name] ?Other Clinician: [name] ?Referring Natalya Domzalski: ?Treating Venezia Sargeant/Extender: [name] ?[name] ?Weeks in Treatment: 9 ?Encounter  Discharge Information Items ?Discharge Condition: Stable ?Ambulatory Status: Knee Scooter ?Discharge Destination: Home ?Transportation: Private Auto ?Accompanied By: Wife ?Schedule Follow-up Appointment: Yes ?Clinical Summary of Care: ?Electronic Signature(s) ?Signed: 10/27/2021 1:38:09 PM By: [name] EMT ?Entered By: [name] on 10/27/2021 13:38:09 ?-------------------------------------------------------------------------------- ?Vitals Details ?Patient Name: ?Date of Service: ?[name], [place] A. 10/27/2021 10:00 A M ?Medical Record Number: [id number] ?Patient Account Number: [id number] ?Date of Birth/Sex: ?Treating RN: ?August 08, 1955 (66 y.o. [name] ?Primary Care Isabela Nardelli: [name] ?Other Clinician: [name] ?Referring Adom Schoeneck: ?Treating Ryatt Corsino/Extender: [name] ?[name] ?Weeks in Treatment: 9 ?Vital Signs ?Time Taken: 09:40 ?Temperature (??F): 97.7 ?Height (in): 74 ?Pulse (bpm): 87 ?Weight (lbs): 186 ?Respiratory Rate (breaths/min): 16 ?Body Mass Index (BMI): 23.9 ?Blood Pressure (mmHg): 114/92 ?Capillary Blood Glucose (mg/dl): 227 ?Reference Range: 80 - 120 mg / dl ?Electronic Signature(s) ?Signed: 10/27/2021 1:33:36 PM By: [name] EMT ?Entered By: [name] on 10/27/2021 13:33:35 ?

## 2021-10-28 ENCOUNTER — Encounter (HOSPITAL_BASED_OUTPATIENT_CLINIC_OR_DEPARTMENT_OTHER): Payer: BC Managed Care – PPO | Admitting: General Surgery

## 2021-10-28 DIAGNOSIS — I2510 Atherosclerotic heart disease of native coronary artery without angina pectoris: Secondary | ICD-10-CM | POA: Diagnosis not present

## 2021-10-28 DIAGNOSIS — M86672 Other chronic osteomyelitis, left ankle and foot: Secondary | ICD-10-CM | POA: Diagnosis not present

## 2021-10-28 DIAGNOSIS — L97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: Secondary | ICD-10-CM | POA: Diagnosis not present

## 2021-10-28 DIAGNOSIS — E1169 Type 2 diabetes mellitus with other specified complication: Secondary | ICD-10-CM | POA: Diagnosis not present

## 2021-10-28 DIAGNOSIS — E11621 Type 2 diabetes mellitus with foot ulcer: Secondary | ICD-10-CM | POA: Diagnosis not present

## 2021-10-28 DIAGNOSIS — T8131XA Disruption of external operation (surgical) wound, not elsewhere classified, initial encounter: Secondary | ICD-10-CM | POA: Diagnosis not present

## 2021-10-28 DIAGNOSIS — Z951 Presence of aortocoronary bypass graft: Secondary | ICD-10-CM | POA: Diagnosis not present

## 2021-10-28 DIAGNOSIS — E11610 Type 2 diabetes mellitus with diabetic neuropathic arthropathy: Secondary | ICD-10-CM | POA: Diagnosis not present

## 2021-10-28 LAB — GLUCOSE, CAPILLARY
Glucose-Capillary: 200 mg/dL — ABNORMAL HIGH (ref 70–99)
Glucose-Capillary: 245 mg/dL — ABNORMAL HIGH (ref 70–99)

## 2021-10-28 NOTE — Progress Notes (Signed)
[name], [name] ([id number]) Visit Report for 10/26/2021 Arrival Information Details Patient Name: Date of Service: NA [name] 10/26/2021 10:00 A M Medical Record Number: [id number] Patient Account Number: [id number] Date of Birth/Sex: Treating RN: 1955/08/11 (66 y.o. [name] Primary Care Chloe Miyoshi: [name] Other Clinician: [name] Referring Kelon Easom: Treating Maris Abascal/Extender: [name] in Treatment: 8 Visit Information History Since Last Visit All ordered tests and consults were completed: Yes Patient Arrived: Knee Scooter Added or deleted any medications: No Arrival Time: 09:27 Any new allergies or adverse reactions: No Accompanied By: self Had a fall or experienced change in No Transfer Assistance: EasyPivot Patient Lift activities of daily living that may affect Patient Identification Verified: Yes risk of falls: Secondary Verification Process Completed: Yes Signs or symptoms of abuse/neglect since last visito No Patient Requires Transmission-Based Precautions: No Hospitalized since last visit: No Patient Has Alerts: Yes Implantable device outside of the clinic excluding No Patient Alerts: Patient on Blood Thinner cellular tissue based products placed in the center since last visit: Pain Present Now: No Electronic Signature(s) Signed: 10/28/2021 12:04:25 PM By: [name] CHT EMT BS , , Entered By: [name] on 10/26/2021 10:26:26 -------------------------------------------------------------------------------- Vitals Details Patient Name: Date of Service: NA [name] A. 10/26/2021 10:00 A M Medical Record Number: [id number] Patient Account Number: [id number] Date of Birth/Sex: Treating RN: Jan 01, 1956 (66 y.o. [name] Primary Care Lincoln Ginley: [name] Other Clinician: [name] Referring Kelsha Older: Treating Jackey Housey/Extender: [name]:  8 Vital Signs Time Taken: 09:39 Temperature (F): 98.4 Height (in): 74 Pulse (bpm): 95 Weight (lbs): 186 Respiratory Rate (breaths/min): 14 Body Mass Index (BMI): 23.9 Blood Pressure (mmHg): 106/88 Capillary Blood Glucose (mg/dl): 322 Reference Range: 80 - 120 mg / dl Electronic Signature(s) Signed: 10/28/2021 12:04:25 PM By: [name] CHT EMT BS , , Entered By: [name] on 10/26/2021 10:26:51

## 2021-10-28 NOTE — Progress Notes (Signed)
[name], GEISSINGER ([id number]) Visit Report for 10/28/2021 HBO Details Patient Name: Date of Service: NA Colin Nephew A. 10/28/2021 10:00 A M Medical Record Number: [id number] Patient Account Number: [id number] Date of Birth/Sex: Treating RN: Oct 30, 1955 (66 y.o. [name] Primary Care Jhoselyn Ruffini: [name] Other Clinician: [name] Referring Azarion Hove: Treating Dawana Asper/Extender: [name]: 9 HBO Treatment Course Details Treatment Course Number: 1 Ordering Olusegun Gerstenberger: [name] T Treatments Ordered: otal 40 HBO Treatment Start Date: 09/20/2021 HBO Indication: Diabetic Ulcer(s) of the Lower Extremity Standard/Conservative Wound Care tried and failed greater than or equal to 30 days Wound #1 Left, Medial Foot HBO Treatment Details Treatment Number: 28 Patient Type: Outpatient Chamber Type: Monoplace Chamber Serial #: [id number] Treatment Protocol: 2.5 ATA with 90 minutes oxygen, with two 5 minute air breaks Treatment Details Compression Rate Down: 2.0 psi / minute De-Compression Rate Up: A breaks and breathing ir Compress Tx Pressure periods Decompress Decompress Begins Reached (leave unused spaces Begins Ends blank) Chamber Pressure (ATA 1 2.5 2.5 2.5 2.5 2.5 - - 2.5 1 ) Clock Time (24 hr) 09:33 09:47 10:17 10:22 10:52 10:57 - - 11:27 11:37 Treatment Length: 124 (minutes) Treatment Segments: 4 Vital Signs Capillary Blood Glucose Reference Range: 80 - 120 mg / dl HBO Diabetic Blood Glucose Intervention Range: <131 mg/dl or >366 mg/dl Time Vitals Blood Respiratory Capillary Blood Glucose Pulse Action Type: Pulse: Temperature: Taken: Pressure: Rate: Glucose (mg/dl): Meter #: Oximetry (%) Taken: Pre 09:28 121/67 81 16 98.1 200 Post 11:41 155/84 74 16 98.4 245 Treatment Response Treatment Toleration: Well Treatment Completion Status: Treatment Completed without Adverse Event Additional Procedure Documentation Tissue  Sevierity: Necrosis of bone Physician HBO Attestation: I certify that I supervised this HBO treatment in accordance with Medicare guidelines. A trained emergency response team is readily available per Yes hospital policies and procedures. Continue HBOT as ordered. Yes Electronic Signature(s) Signed: 10/28/2021 12:02:51 PM By: [name] MD FACS Entered By: [name] on 10/28/2021 12:02:51 -------------------------------------------------------------------------------- HBO Safety Checklist Details Patient Name: Date of Service: NA Colin Nephew A. 10/28/2021 10:00 A M Medical Record Number: [id number] Patient Account Number: [id number] Date of Birth/Sex: Treating RN: 1956/01/30 (66 y.o. [name] Primary Care Belmira Daley: [name] Other Clinician: [name] Referring Keiera Strathman: Treating Lanie Schelling/Extender: [name]: 9 HBO Safety Checklist Items Safety Checklist Consent Form Signed Patient voided / foley secured and emptied When did you last eato 0715 Last dose of injectable or oral agent 0715 Ostomy pouch emptied and vented if applicable NA All implantable devices assessed, documented and approved NA Intravenous access site secured and place NA Valuables secured Linens and cotton and cotton/polyester blend (less than 51% polyester) Personal oil-based products / skin lotions / body lotions removed Wigs or hairpieces removed NA Smoking or tobacco materials removed NA Books / newspapers / magazines / loose paper removed Cologne, aftershave, perfume and deodorant removed Jewelry removed (may wrap wedding band) Make-up removed NA Hair care products removed Battery operated devices (external) removed Heating patches and chemical warmers removed Titanium eyewear removed NA Nail polish cured greater than 10 hours NA Casting material cured greater than 10 hours NA Hearing aids removed NA Loose dentures or  partials removed NA Prosthetics have been removed NA Patient demonstrates correct use of air break device (if applicable) Patient concerns have been addressed Patient grounding bracelet on and cord attached to chamber Specifics for Inpatients (complete in addition to above) Medication sheet sent  with patient NA Intravenous medications needed or due during therapy sent with patient NA Drainage tubes (e.g. nasogastric tube or chest tube secured and vented) NA Endotracheal or Tracheotomy tube secured NA Cuff deflated of air and inflated with saline NA Airway suctioned Notes Paper version used prior to treatment. Electronic Signature(s) Signed: 10/28/2021 12:04:25 PM By: [name] CHT EMT BS , , Entered By: [name] on 10/28/2021 11:00:20

## 2021-10-28 NOTE — Progress Notes (Signed)
[name], [name] ([id number]) Visit Report for 10/28/2021 Arrival Information Details Patient Name: Date of Service: NA [name] 10/28/2021 10:00 A M Medical Record Number: [id number] Patient Account Number: [id number] Date of Birth/Sex: Treating RN: [birth year] (66 y.o. [name] Primary Care Maleiah Dula: [name] Other Clinician: Donavan Burnet Referring Brenn Deziel: Treating Aleksander Edmiston/Extender: [name] in Treatment: 9 Visit Information History Since Last Visit All ordered tests and consults were completed: Yes Patient Arrived: Knee Scooter Added or deleted any medications: No Arrival Time: 09:13 Any new allergies or adverse reactions: No Accompanied By: self Had a fall or experienced change in No Transfer Assistance: None activities of daily living that may affect Patient Identification Verified: Yes risk of falls: Secondary Verification Process Completed: Yes Signs or symptoms of abuse/neglect since last visito No Patient Requires Transmission-Based Precautions: No Hospitalized since last visit: No Patient Has Alerts: Yes Implantable device outside of the clinic excluding No Patient Alerts: Patient on Blood Thinner cellular tissue based products placed in the center since last visit: Pain Present Now: No Electronic Signature(s) Signed: 10/28/2021 12:04:25 PM By: Donavan Burnet CHT EMT BS , , Entered By: Donavan Burnet on 10/28/2021 12:03:49 -------------------------------------------------------------------------------- Encounter Discharge Information Details Patient Name: Date of Service: [name] A. 10/28/2021 10:00 A M Medical Record Number: [id number] Patient Account Number: [id number] Date of Birth/Sex: Treating RN: [birth year] (66 y.o. [name] Primary Care Jeyla Bulger: [name] Other Clinician: Donavan Burnet Referring Deniese Oberry: Treating Farrell Broerman/Extender: [name] in  Treatment: 9 Encounter Discharge Information Items Discharge Condition: Stable Ambulatory Status: Knee Scooter Discharge Destination: Home Transportation: Private Auto Accompanied By: spouse Schedule Follow-up Appointment: No Clinical Summary of Care: Electronic Signature(s) Signed: 10/28/2021 12:04:25 PM By: Donavan Burnet CHT EMT BS , , Entered By: Donavan Burnet on 10/28/2021 12:03:39 -------------------------------------------------------------------------------- Vitals Details Patient Name: Date of Service: NA [name] A. 10/28/2021 10:00 A M Medical Record Number: [id number] Patient Account Number: [id number] Date of Birth/Sex: Treating RN: March 14, 1956 (66 y.o. [name] Primary Care Samarie Pinder: [name] Other Clinician: Donavan Burnet Referring Laquasha Groome: Treating Terez Freimark/Extender: [name] Weeks in Treatment: 9 Vital Signs Time Taken: 09:28 Temperature (F): 98.1 Height (in): 74 Pulse (bpm): 81 Weight (lbs): 186 Respiratory Rate (breaths/min): 16 Body Mass Index (BMI): 23.9 Blood Pressure (mmHg): 121/67 Capillary Blood Glucose (mg/dl): 200 Reference Range: 80 - 120 mg / dl Electronic Signature(s) Signed: 10/28/2021 12:04:25 PM By: Donavan Burnet CHT EMT BS , , Entered By: Donavan Burnet on 10/28/2021 10:59:06

## 2021-10-28 NOTE — Progress Notes (Signed)
[name], [name] ([id number]) Visit Report for 10/26/2021 HBO Details Patient Name: Date of Service: NA [name] 10/26/2021 10:00 A M Medical Record Number: [id number] Patient Account Number: [id number] Date of Birth/Sex: Treating RN: [birth year] (65 y.o. [name] Primary Care Amyre Segundo: [name] Other Clinician: Donavan Burnet Referring Okie Bogacz: Treating Agam Tuohy/Extender: [name] Weeks in Treatment: 8 HBO Treatment Course Details Treatment Course Number: 1 Ordering Savahanna Almendariz: [name] T Treatments Ordered: otal 40 HBO Treatment Start Date: 09/20/2021 HBO Indication: Diabetic Ulcer(s) of the Lower Extremity Standard/Conservative Wound Care tried and failed greater than or equal to 30 days Wound #1 Left, Medial Foot HBO Treatment Details Treatment Number: 26 Patient Type: Outpatient Chamber Type: Monoplace Chamber Serial #: [id number] Treatment Protocol: 2.5 ATA with 90 minutes oxygen, with two 5 minute air breaks Treatment Details Compression Rate Down: 2.0 psi / minute De-Compression Rate Up: 2.0 psi / minute A breaks and breathing ir Compress Tx Pressure periods Decompress Decompress Begins Reached (leave unused spaces Begins Ends blank) Chamber Pressure (ATA 1 2.5 2.5 2.5 2.5 2.5 - - 2.5 1 ) Clock Time (24 hr) 09:46 09:57 10:27 10:32 11:02 11:07 - - 11:38 11:47 Treatment Length: 121 (minutes) Treatment Segments: 4 Vital Signs Capillary Blood Glucose Reference Range: 80 - 120 mg / dl HBO Diabetic Blood Glucose Intervention Range: <131 mg/dl or >249 mg/dl Time Vitals Blood Respiratory Capillary Blood Glucose Pulse Action Type: Pulse: Temperature: Taken: Pressure: Rate: Glucose (mg/dl): Meter #: Oximetry (%) Taken: Pre 09:39 106/88 95 14 98.4 170 Post 11:53 154/95 86 14 98.3 241 Treatment Response Treatment Toleration: Well Treatment Completion Status: Treatment Completed without Adverse Event Additional Procedure  Documentation Tissue Sevierity: Necrosis of bone Physician HBO Attestation: I certify that I supervised this HBO treatment in accordance with Medicare guidelines. A trained emergency response team is readily available per Yes hospital policies and procedures. Continue HBOT as ordered. Yes Electronic Signature(s) Signed: 10/26/2021 3:12:19 PM By: [name] EMT Signed: 10/26/2021 5:05:48 PM By: [name] MD FACS Previous Signature: 10/26/2021 12:37:47 PM Version By: [name] MD FACS Entered By: [name] on 10/26/2021 15:12:19 -------------------------------------------------------------------------------- HBO Safety Checklist Details Patient Name: Date of Service: NA [name] A. 10/26/2021 10:00 A M Medical Record Number: [id number] Patient Account Number: [id number] Date of Birth/Sex: Treating RN: [birth year] (66 y.o. [name] Primary Care Marisella Puccio: [name] Other Clinician: Donavan Burnet Referring Keelin Sheridan: Treating Jamyiah Labella/Extender: [name] Weeks in Treatment: 8 HBO Safety Checklist Items Safety Checklist Consent Form Signed Patient voided / foley secured and emptied When did you last eato 0800 Last dose of injectable or oral agent 0800 Ostomy pouch emptied and vented if applicable NA All implantable devices assessed, documented and approved NA Intravenous access site secured and place NA Valuables secured Linens and cotton and cotton/polyester blend (less than 51% polyester) Personal oil-based products / skin lotions / body lotions removed Wigs or hairpieces removed NA Smoking or tobacco materials removed NA Books / newspapers / magazines / loose paper removed Cologne, aftershave, perfume and deodorant removed Jewelry removed (may wrap wedding band) Make-up removed NA Hair care products removed Battery operated devices (external) removed Heating patches and chemical warmers removed Titanium eyewear  removed Nail polish cured greater than 10 hours NA Casting material cured greater than 10 hours NA Hearing aids removed NA Loose dentures or partials removed NA Prosthetics have been removed NA Patient demonstrates correct use of air break device (if applicable) Patient concerns have  been addressed Patient grounding bracelet on and cord attached to chamber Specifics for Inpatients (complete in addition to above) Medication sheet sent with patient NA Intravenous medications needed or due during therapy sent with patient NA Drainage tubes (e.g. nasogastric tube or chest tube secured and vented) NA Endotracheal or Tracheotomy tube secured NA Cuff deflated of air and inflated with saline NA Airway suctioned NA Notes Paper version used prior to treatment. Electronic Signature(s) Signed: 10/28/2021 12:04:25 PM By: Donavan Burnet CHT EMT BS , , Entered By: Donavan Burnet on 10/26/2021 10:29:50

## 2021-10-28 NOTE — Progress Notes (Signed)
THAYER, EMBLETON ([id number]) Visit Report for 10/28/2021 SuperBill Details Patient Name: Date of Service: NA [name] 10/28/2021 Medical Record Number: [id number] Patient Account Number: [id number] Date of Birth/Sex: Treating RN: September 13, 1955 (66 y.o. 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 Facility Procedures CPT4 Code Description Modifier Quantity [id number] G0277-(Facility Use Only) HBOT full body chamber, , 4 ICD-10 Diagnosis Description E11.621 Type 2 diabetes mellitus with foot ulcer 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 M86.672 Other chronic osteomyelitis, left ankle and foot 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

## 2021-10-29 ENCOUNTER — Encounter (HOSPITAL_BASED_OUTPATIENT_CLINIC_OR_DEPARTMENT_OTHER): Payer: BC Managed Care – PPO | Admitting: General Surgery

## 2021-10-29 DIAGNOSIS — L97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: Secondary | ICD-10-CM | POA: Diagnosis not present

## 2021-10-29 DIAGNOSIS — T8131XA Disruption of external operation (surgical) wound, not elsewhere classified, initial encounter: Secondary | ICD-10-CM | POA: Diagnosis not present

## 2021-10-29 DIAGNOSIS — M86672 Other chronic osteomyelitis, left ankle and foot: Secondary | ICD-10-CM | POA: Diagnosis not present

## 2021-10-29 DIAGNOSIS — Z951 Presence of aortocoronary bypass graft: Secondary | ICD-10-CM | POA: Diagnosis not present

## 2021-10-29 DIAGNOSIS — E1169 Type 2 diabetes mellitus with other specified complication: Secondary | ICD-10-CM | POA: Diagnosis not present

## 2021-10-29 DIAGNOSIS — E11610 Type 2 diabetes mellitus with diabetic neuropathic arthropathy: Secondary | ICD-10-CM | POA: Diagnosis not present

## 2021-10-29 DIAGNOSIS — I2510 Atherosclerotic heart disease of native coronary artery without angina pectoris: Secondary | ICD-10-CM | POA: Diagnosis not present

## 2021-10-29 DIAGNOSIS — E11621 Type 2 diabetes mellitus with foot ulcer: Secondary | ICD-10-CM | POA: Diagnosis not present

## 2021-10-29 LAB — GLUCOSE, CAPILLARY: Glucose-Capillary: 254 mg/dL — ABNORMAL HIGH (ref 70–99)

## 2021-10-29 NOTE — Progress Notes (Signed)
MOHAMUD, MROZEK ([id number]) Visit Report for 10/29/2021 Problem List Details Patient Name: Date of Service: NA [name] 10/29/2021 10:00 A M Medical Record Number: [id number] Patient Account Number: [id number] Date of Birth/Sex: Treating RN: [birth year] (66 y.o. 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 10/29/2021 Medical Record Number: [id number] Patient Account Number: [id number] Date of Birth/Sex: Treating RN: 07/21/1955 (66 y.o. 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 Facility Procedures CPT4 Code: [id number] Description: G0277-(Facility Use Only) HBOT full body chamber, , ICD-10 Diagnosis Description E11.621 Type 2 diabetes mellitus with foot ulcer 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 M86.672 Other chronic  osteomyelitis, left ankle and foot 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

## 2021-10-29 NOTE — Progress Notes (Signed)
JEP, DYAS ([id number]) Visit Report for 10/29/2021 Chief Complaint Document Details Patient Name: Date of Service: NA [name] 10/29/2021 12:45 PM Medical Record Number: [id number] Patient Account Number: [id number] Date of Birth/Sex: Treating RN: January 05, 1956 (66 y.o. [name] Primary Care Provider: [name] Other Clinician: Referring Provider: Treating Provider/Extender: [name] in Treatment: 9 Information Obtained from: Patient Chief Complaint Patients presents for treatment of an open diabetic ulcer with exposed bone and osteomyelitis Electronic Signature(s) Signed: 10/29/2021 1:08:00 PM By: [name] MD FACS Entered By: [name] on 10/29/2021 13:07:59 -------------------------------------------------------------------------------- Debridement Details Patient Name: Date of Service: NA Colin Nephew A. 10/29/2021 12:45 PM Medical Record Number: [id number] Patient Account Number: [id number] Date of Birth/Sex: Treating RN: Sep 08, 1955 (66 y.o. [name] Primary Care Provider: [name] Other Clinician: Referring Provider: Treating Provider/Extender: [name]: 9 Debridement Performed for Assessment: Wound #1 Left,Medial Foot Performed By: Physician [name], MD Debridement Type: Debridement Severity of Tissue Pre Debridement: Fat layer exposed Level of Consciousness (Pre-procedure): Awake and Alert Pre-procedure Verification/Time Out Yes - 12:50 Taken: Start Time: 12:50 Pain Control: Other : benzocaine 20% spray T Area Debrided (L x W): otal 3 (cm) x 1.5 (cm) = 4.5 (cm) Tissue and other material debrided: Viable, Non-Viable, Slough, Subcutaneous, Slough Level: Skin/Subcutaneous Tissue Debridement Description: Excisional Instrument: Curette Bleeding: Minimum Hemostasis Achieved: Pressure Procedural Pain: 0 Post Procedural Pain: 0 Response to  Treatment: Procedure was tolerated well Level of Consciousness (Post- Awake and Alert procedure): Post Debridement Measurements of Total Wound Length: (cm) 7 Width: (cm) 3.7 Depth: (cm) 0.7 Volume: (cm) 14.239 Character of Wound/Ulcer Post Debridement: Improved Severity of Tissue Post Debridement: Fat layer exposed Post Procedure Diagnosis Same as Pre-procedure Electronic Signature(s) Signed: 10/29/2021 1:50:58 PM By: [name] MD FACS Signed: 10/29/2021 3:21:06 PM By: Samuella Bruin Entered By: Samuella Bruin on 10/29/2021 12:52:32 -------------------------------------------------------------------------------- HPI Details Patient Name: Date of Service: NA Colin [name] 10/29/2021 12:45 PM Medical Record Number: [id number] Patient Account Number: [id number] Date of Birth/Sex: Treating RN: [birth year] (66 y.o. [name] Primary Care Provider: [name] Other Clinician: Referring Provider: Treating Provider/Extender: [name]: 9 History of Present Illness HPI Description: ADMISSION 08/25/2021 This is a 66 year old man who initially presented to his primary care provider in September 2022 with pain in his left foot. He was sent for an x-ray and while the x-ray was being performed, the tech pointed out a wound on his foot that the patient was not aware existed. He does have type 2 diabetes with significant neuropathy. His diabetes is suboptimally controlled with his most recent A1c being 8.5. He also has a history of coronary artery disease status post three- vessel CABG. he was initially seen by orthopedics, but they referred him to [facility]. He has undergone at least 7 operations/debridements and several applications of skin substitute under the care of podiatry. He has been in a wound VAC for much of this time. His most recent procedure was July 28, 2021. A portion of the talus was biopsied  and was found to be consistent with osteomyelitis. Culture also returned positive for corynebacterium. He was seen on August 16, 2021 by infectious disease. A PICC line has been placed and he will be receiving a 6-week course of IV daptomycin and cefepime. In October 2022, he underwent lower extremity vascular studies. Results are copied here: Right: Resting right  ankle-brachial index is within normal range. No evidence of significant right lower extremity arterial disease. The right toe-brachial index is abnormal. Left: Resting left ankle-brachial index indicates mild left lower extremity arterial disease. The left toe-brachial index is abnormal. He has not been seen by vascular surgery despite these findings. He presented to clinic today in a cam boot and is using a knee scooter to offload. Wound VAC was in place. Once this was removed, a large ulcer was identified on the left midfoot/ankle. Bone is frankly exposed. There is no malodorous or purulent drainage. There is some granulation tissue over the central portion of the exposed bone. There is a tunnel that extends posteriorly for roughly 10 cm. It has been discussed with him by multiple providers that he is at very high risk of losing his lower leg because of this wound. He is extremely eager to avoid this outcome and is here today to review his options as well as receive ongoing wound care. 09/03/2021: Here for reevaluation of his wound. There does not appear to have been any substantial improvement overall since our last visit. He has been in a wound VAC with white foam overlying the exposed bone. We are working on getting him approved for hyperbaric oxygen therapy. 09/10/2021: We are in the process of getting him cleared to begin hyperbaric oxygen therapy. He still needs to obtain a chest x-ray. Although the wound measurements are roughly the same, I think the overall appearance of the wound is better. The exposed bone has a bit more  granulation tissue covering it. He has not received a vascular surgery appointment to reevaluate his flow to the wound. 09/17/2021: He has been approved for hyperbaric oxygen therapy and completed his chest x-ray, which I reviewed and it appears normal. The tunnels at the 12 and 10:00 positions are smaller. There is more granulation tissue covering the exposed bone and the undermining has decreased. He still has not received a vascular surgery appointment. 09/24/2021: He initiated hyperbaric oxygen therapy this week and is tolerating it well. He has an appointment with vascular surgery coming up on May 16. The granulation tissue is covering more of the exposed bone and both tunnels are a bit smaller. 10/01/2021: He continues to tolerate hyperbaric oxygen therapy. He saw infectious disease and they are planning to pull his PICC line. He has been initiated on oral antibiotics (doxycycline and Augmentin). The wound looks about the same but the tunnels are a little bit smaller. The skin seems to be contracting somewhat around the exposed bone. 10/08/2021: The wound is still about the same size, but the tunnels continue to come in and the skin is contracting around the exposed bone. He continues to have some accumulation of necrotic material in the inferoposterior aspect of the wound as well as accumulation at the 12:00 tunnel area. 10/15/2021: The wound is smaller today. The tunnels continue to come in. There is less necrotic tissue present. He does have some periwound maceration. 10/22/2021: The wound is about the same size. There is a little bit less undermining at the distal portion. The exposed bone is dark and I am not sure if this is staining from silver nitrate or his VAC sponge or if it represents necrosis. The tunnels are shallower but he does have some serous drainage coming from the 10:00 tunnel. He continues to tolerate hyperbaric oxygen therapy well. 10/29/2021: The undermining continues to improve.  The tunnels are about the same. He has good granulation tissue overlying the majority of  the exposed bone. It does appear that perhaps the tubing from his wound VAC has been eroding the skin at the 12 clock position. He continues to accumulate senescent epithelium around the borders of the wound. Electronic Signature(s) Signed: 10/29/2021 1:10:26 PM By: [name] MD FACS Entered By: [name] on 10/29/2021 13:10:26 -------------------------------------------------------------------------------- Physical Exam Details Patient Name: Date of Service: NA Colin Nephew A. 10/29/2021 12:45 PM Medical Record Number: [id number] Patient Account Number: [id number] Date of Birth/Sex: Treating RN: 1956/04/04 (66 y.o. [name] Primary Care Provider: [name] Other Clinician: Referring Provider: Treating Provider/Extender: [name]: 9 Constitutional He is hypertensive, but asymptomatic.. . . . No acute distress. Respiratory Normal work of breathing on room air. Notes 10/29/2021: The undermining continues to improve. The tunnels are about the same. He has good granulation tissue overlying the majority of the exposed bone. It does appear that perhaps the tubing from his wound VAC has been eroding the skin at the 12 clock position. He continues to accumulate senescent epithelium around the borders of the wound. Electronic Signature(s) Signed: 10/29/2021 1:11:21 PM By: [name] MD FACS Entered By: [name] on 10/29/2021 13:11:20 -------------------------------------------------------------------------------- Physician Orders Details Patient Name: Date of Service: NA Colin Nephew A. 10/29/2021 12:45 PM Medical Record Number: [id number] Patient Account Number: [id number] Date of Birth/Sex: Treating RN: January 26, 1956 (66 y.o. 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 - Dr. Lady Gary - Room 2 Return A Bathing/ Shower/ Hygiene May shower with protection but do not get wound dressing(s) wet. Negative Presssure Wound Therapy Wound Vac to wound continuously at 173mm/hg pressure Black Foam White Foam - Apply white foam or saline moistened gauze to tunnel at 10:00 and 12:00 Other: - Apply silver collagen on wound bed, under black foam Home Health No change in wound care orders this week; continue Home Health for wound care. May utilize formulary equivalent dressing for wound treatment orders unless otherwise specified. Dressing changes to be completed by Home Health on Monday / Wednesday / Friday except when patient has scheduled visit at [facility]. Other Home Health Orders/Instructions: [name]: Change vac 3x week Hyperbaric Oxygen Therapy Evaluate for HBO Therapy Indication: - Wagner 3 diabetic ulcer left foot If appropriate for treatment, begin HBOT per protocol: 2.5 ATA for 90 Minutes with 2 Five (5) Minute A Breaks ir Total Number of Treatments: - 40 One treatments per day (delivered Monday through Friday unless otherwise specified in Special Instructions below): Finger stick Blood Glucose Pre- and Post- HBOT Treatment. Follow Hyperbaric Oxygen Glycemia Protocol A frin (Oxymetazoline HCL) 0.05% nasal spray - 1 spray in both nostrils daily as needed prior to HBO treatment for difficulty clearing  ears Wound Treatment Wound #1 - Foot Wound Laterality: Left, Medial Cleanser: Wound Cleanser 3 x Per Week/30 Days Discharge Instructions: Cleanse the wound with wound cleanser prior to applying a clean dressing using gauze sponges, not tissue or cotton balls. Peri-Wound Care: Skin Prep 3 x Per Week/30 Days Discharge Instructions:  Use skin prep as directed Peri-Wound Care: Zinc Oxide Ointment 30g tube 3 x Per Week/30 Days Discharge Instructions: Apply Zinc Oxide to periwound with each dressing change Prim Dressing: Promogran Prisma Matrix, 4.34 (sq in) (silver collagen) 3 x Per Week/30 Days ary Discharge Instructions: Moisten collagen with saline or hydrogel Prim Dressing: Wound Vac ary 3 x Per Week/30 Days GLYCEMIA INTERVENTIONS PROTOCOL PRE-HBO GLYCEMIA INTERVENTIONS ACTION INTERVENTION Obtain pre-HBO capillary blood glucose (ensure 1 physician order is in chart). A. Notify HBO physician and await physician orders. 2 If result is 70 mg/dl or below: B. If the result meets the hospital definition of a critical result, follow hospital policy. A. Give patient an 8 ounce Glucerna Shake, an 8 ounce Ensure, or 8 ounces of a Glucerna/Ensure equivalent dietary supplement*. B. Wait 30 minutes. If result is 71 mg/dl to 914 mg/dl: C. Retest patients capillary blood glucose (CBG). D. If result greater than or equal to 110 mg/dl, proceed with HBO. If result less than 110 mg/dl, notify HBO physician and consider holding HBO. If result is 131 mg/dl to 782 mg/dl: A. Proceed with HBO. A. Notify HBO physician and await physician orders. B. It is recommended to hold HBO and do If result is 250 mg/dl or greater: blood/urine ketone testing. C. If the result meets the hospital definition of a critical result, follow hospital policy. POST-HBO GLYCEMIA INTERVENTIONS ACTION INTERVENTION Obtain post HBO capillary blood glucose (ensure 1 physician order is in chart). A. Notify HBO physician  and await physician orders. 2 If result is 70 mg/dl or below: B. If the result meets the hospital definition of a critical result, follow hospital policy. A. Give patient an 8 ounce Glucerna Shake, an 8 ounce Ensure, or 8 ounces of a Glucerna/Ensure equivalent dietary supplement*. B. Wait 15 minutes for symptoms of If result is 71 mg/dl to 956 mg/dl: hypoglycemia (i.e. nervousness, anxiety, sweating, chills, clamminess, irritability, confusion, tachycardia or dizziness). C. If patient asymptomatic, discharge patient. If patient symptomatic, repeat capillary blood glucose (CBG) and notify HBO physician. If result is 101 mg/dl to 213 mg/dl: A. Discharge patient. A. Notify HBO physician and await physician orders. B. It is recommended to do blood/urine ketone If result is 250 mg/dl or greater: testing. C. If the result meets the hospital definition of a critical result, follow hospital policy. *Juice or candies are NOT equivalent products. If patient refuses the Glucerna or Ensure, please consult the hospital dietitian for an appropriate substitute. Electronic Signature(s) Signed: 10/29/2021 1:50:58 PM By: [name] MD FACS Entered By: [name] on 10/29/2021 13:11:40 -------------------------------------------------------------------------------- Problem List Details Patient Name: Date of Service: NA Colin Nephew A. 10/29/2021 12:45 PM Medical Record Number: [id number] Patient Account Number: [id number] Date of Birth/Sex: Treating RN: January 14, 1956 (66 y.o. 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 10/29/2021 12:45 PM Medical Record Number: [id number] Patient Account Number: [id number] Date of Birth/Sex: Treating RN: Oct 10, 1955 (66 y.o. [name] Primary Care Provider: [name] Other Clinician: Referring Provider: Treating Provider/Extender: [name]: 9 Subjective Chief Complaint Information obtained from Patient Patients presents for treatment of an open diabetic ulcer with exposed bone and osteomyelitis History of Present Illness (HPI) ADMISSION 08/25/2021 This is a 66 year old man who initially presented to his primary care provider in September 2022 with pain in his left foot. He was sent for an x-ray and while the x-ray was being performed, the tech pointed out a wound on his foot that the patient was not aware existed. He does have type 2 diabetes with significant neuropathy. His diabetes is suboptimally controlled with his most recent A1c being 8.5. He also has a history of coronary artery disease status post three- vessel CABG. he was initially seen by orthopedics, but they referred him to [facility]. He has undergone at least 7 operations/debridements and several applications of skin substitute under the care of podiatry. He has been in a wound VAC for much of this time. His most recent procedure was July 28, 2021. A  portion of the talus was biopsied and was found to be consistent with osteomyelitis. Culture also returned positive for corynebacterium. He was seen on August 16, 2021 by infectious disease. A PICC line has been placed and he will be receiving a 6-week course of IV daptomycin and cefepime. In October 2022, he underwent lower extremity vascular studies. Results are copied here: Right: Resting right ankle-brachial index is within normal range. No evidence of significant right lower extremity arterial disease. The right toe-brachial index is abnormal. Left: Resting left ankle-brachial index indicates mild left lower extremity arterial disease. The left toe-brachial index is abnormal. He has not been seen by vascular surgery despite these findings. He presented to clinic today in a cam boot and is using a knee scooter to offload. Wound VAC was in place. Once this was removed, a large ulcer was identified on the left midfoot/ankle. Bone is frankly exposed. There is no malodorous or purulent drainage. There is some granulation tissue over the central portion of the exposed bone. There is a tunnel that extends posteriorly for roughly 10 cm. It has been discussed with him by multiple providers that he is at very high risk of losing his lower leg because of this wound. He is extremely eager to avoid this outcome and is here today to review his options as well as receive ongoing wound care. 09/03/2021: Here for reevaluation of his wound. There does not appear to have been any substantial improvement overall since our last visit. He has been in a wound VAC with white foam overlying the exposed bone. We are working on getting him approved for hyperbaric oxygen therapy. 09/10/2021: We are in the process of getting him cleared to begin hyperbaric oxygen therapy. He still needs to obtain a chest x-ray. Although the wound measurements are roughly the same, I think the overall appearance of the wound is better. The  exposed bone has a bit more granulation tissue covering it. He has not received a vascular surgery appointment to reevaluate his flow to the wound. 09/17/2021: He has been approved for hyperbaric oxygen therapy and completed his chest x-ray, which I reviewed and it appears normal. The tunnels at the 12 and 10:00 positions are smaller. There is more granulation  tissue covering the exposed bone and the undermining has decreased. He still has not received a vascular surgery appointment. 09/24/2021: He initiated hyperbaric oxygen therapy this week and is tolerating it well. He has an appointment with vascular surgery coming up on May 16. The granulation tissue is covering more of the exposed bone and both tunnels are a bit smaller. 10/01/2021: He continues to tolerate hyperbaric oxygen therapy. He saw infectious disease and they are planning to pull his PICC line. He has been initiated on oral antibiotics (doxycycline and Augmentin). The wound looks about the same but the tunnels are a little bit smaller. The skin seems to be contracting somewhat around the exposed bone. 10/08/2021: The wound is still about the same size, but the tunnels continue to come in and the skin is contracting around the exposed bone. He continues to have some accumulation of necrotic material in the inferoposterior aspect of the wound as well as accumulation at the 12:00 tunnel area. 10/15/2021: The wound is smaller today. The tunnels continue to come in. There is less necrotic tissue present. He does have some periwound maceration. 10/22/2021: The wound is about the same size. There is a little bit less undermining at the distal portion. The exposed bone is dark and I am not sure if this is staining from silver nitrate or his VAC sponge or if it represents necrosis. The tunnels are shallower but he does have some serous drainage coming from the 10:00 tunnel. He continues to tolerate hyperbaric oxygen therapy well. 10/29/2021: The  undermining continues to improve. The tunnels are about the same. He has good granulation tissue overlying the majority of the exposed bone. It does appear that perhaps the tubing from his wound VAC has been eroding the skin at the 12 clock position. He continues to accumulate senescent epithelium around the borders of the wound. Patient History Information obtained from Patient. Family History Cancer - Father, Diabetes - Father,Mother,Paternal Grandparents, Heart Disease - Father, Hypertension - Father, No family history of Hereditary Spherocytosis, Kidney Disease, Lung Disease, Seizures, Stroke, Thyroid Problems, Tuberculosis. Social History Never smoker, Marital Status - Married, Alcohol Use - Rarely, Drug Use - No History, Caffeine Use - Daily. Medical History Eyes Patient has history of Cataracts - Removed 2008 Cardiovascular Patient has history of Coronary Artery Disease, Hypertension, Myocardial Infarction, Peripheral Arterial Disease Endocrine Patient has history of Type II Diabetes Musculoskeletal Patient has history of Osteomyelitis Neurologic Patient has history of Neuropathy Medical A Surgical History Notes nd Cardiovascular Hypercholesterolemia Abnormal EKG CABG X3 2019 Gastrointestinal GERD Musculoskeletal Diabetic foot ulcer Objective Constitutional He is hypertensive, but asymptomatic.Marland Kitchen No acute distress. Vitals Time Taken: 12:32 PM, Height: 74 in, Weight: 186 lbs, BMI: 23.9, Temperature: 98 F, Pulse: 93 bpm, Respiratory Rate: 16 breaths/min, Blood Pressure: 159/86 mmHg. Respiratory Normal work of breathing on room air. General Notes: 10/29/2021: The undermining continues to improve. The tunnels are about the same. He has good granulation tissue overlying the majority of the exposed bone. It does appear that perhaps the tubing from his wound VAC has been eroding the skin at the 12 clock position. He continues to accumulate senescent epithelium around the borders  of the wound. Integumentary (Hair, Skin) Wound #1 status is Open. Original cause of wound was Gradually Appeared. The date acquired was: 02/22/2021. The wound has been in treatment 9 weeks. The wound is located on the Left,Medial Foot. The wound measures 7cm length x 3.7cm width x 0.7cm depth; 20.342cm^2 area and 14.239cm^3 volume. There is bone  and Fat Layer (Subcutaneous Tissue) exposed. There is no undermining noted, however, there is tunneling at 10:00 with a maximum distance of 4.2cm. There is additional tunneling and at 12:00 with a maximum distance of 1.7cm. There is a medium amount of serosanguineous drainage noted. The wound margin is distinct with the outline attached to the wound base. There is large (67-100%) red, pink, hyper - granulation within the wound bed. There is a small (1-33%) amount of necrotic tissue within the wound bed including Adherent Slough and Necrosis of Bone. 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 There was a Excisional Skin/Subcutaneous Tissue Debridement with a total area of 4.5 sq cm performed by Duanne Guessannon, Dorna Mallet, MD. With the following instrument(s): Curette to remove Viable and Non-Viable tissue/material. Material removed includes Subcutaneous Tissue and Slough and after achieving pain control using Other (benzocaine 20% spray). No specimens were taken. A time out was conducted at 12:50, prior to the start of the procedure. A Minimum amount of bleeding was controlled with Pressure.  The procedure was tolerated well with a pain level of 0 throughout and a pain level of 0 following the procedure. Post Debridement Measurements: 7cm length x 3.7cm width x 0.7cm depth; 14.239cm^3 volume. Character of Wound/Ulcer Post Debridement is improved. Severity of Tissue Post Debridement is: Fat layer exposed. Post procedure Diagnosis Wound #1: Same as Pre-Procedure Plan Follow-up Appointments: Return Appointment in 1 week. - Dr. Lady Garyannon - Room 2 Bathing/ Shower/ Hygiene: May shower with protection but do not get wound dressing(s) wet. Negative Presssure Wound Therapy: Wound Vac to wound continuously at 16325mm/hg pressure Black Foam White Foam - Apply white foam or saline moistened gauze to tunnel at 10:00 and 12:00 Other: - Apply silver collagen on wound bed, under black foam Home Health: No change in wound care orders this week; continue Home Health for wound care. May utilize formulary equivalent dressing for wound treatment orders unless otherwise specified. Dressing changes to be completed by Home Health on Monday / Wednesday / Friday except when [facility] Other Home Health Orders/Instructions: [name]: Change vac 3x week Hyperbaric Oxygen Therapy: Evaluate for HBO Therapy Indication: - Wagner 3 diabetic ulcer left foot If appropriate for treatment, begin HBOT per protocol: 2.5 ATA for 90 Minutes with 2 Five (5) Minute Air Breaks T Number of Treatments: - 40 otal One treatments per day (delivered Monday through Friday unless otherwise specified in Special Instructions below): Finger stick Blood Glucose Pre- and Post- HBOT Treatment. Follow Hyperbaric Oxygen Glycemia Protocol Afrin (Oxymetazoline HCL) 0.05% nasal spray - 1 spray in both nostrils daily as needed prior to HBO treatment for difficulty clearing ears WOUND #1: - Foot Wound Laterality: Left, Medial Cleanser: Wound Cleanser 3 x Per Week/30 Days Discharge Instructions:  Cleanse the wound with wound cleanser prior to applying a clean dressing using gauze sponges, not tissue or cotton balls. Peri-Wound Care: Skin Prep 3 x Per Week/30 Days Discharge Instructions: Use skin prep as directed Peri-Wound Care: Zinc Oxide Ointment 30g tube 3 x Per Week/30 Days Discharge Instructions: Apply Zinc Oxide to  periwound with each dressing change Prim Dressing: Promogran Prisma Matrix, 4.34 (sq in) (silver collagen) 3 x Per Week/30 Days ary Discharge Instructions: Moisten collagen with saline or hydrogel Prim Dressing: Wound Vac 3 x Per Week/30 Days ary 10/29/2021: The undermining continues to improve. The tunnels are about the same. He has good granulation tissue overlying the majority of the exposed bone. It does appear that perhaps the tubing from his wound VAC has been eroding the skin at the 12 clock position. He continues to accumulate senescent epithelium around the borders of the wound. I used a curette to debride the senescent epithelium from all around the wound edges. We will continue using the wound VAC with silver collagen on the tissue surface. We will take measures to pad the tubing a little bit better so that it is not eroding into his skin. He will continue his hyperbaric oxygen treatments. I will see him back in 1 week. Electronic Signature(s) Signed: 10/29/2021 1:12:42 PM By: [name] MD FACS Entered By: [name] on 10/29/2021 13:12:41 -------------------------------------------------------------------------------- HxROS Details Patient Name: Date of Service: NA [name], [name] A. 10/29/2021 12:45 PM Medical Record Number: [id number] Patient Account Number: [id number] Date of Birth/Sex: Treating RN: [birth year] (66 y.o. [name] Primary Care Provider: [name] Other Clinician: Referring Provider: Treating Provider/Extender: [name]: 9 Information Obtained From Patient Eyes Medical  History: Positive for: Cataracts - Removed 2008 Cardiovascular Medical History: Positive for: Coronary Artery Disease; Hypertension; Myocardial Infarction; Peripheral Arterial Disease Past Medical History Notes: Hypercholesterolemia Abnormal EKG CABG X3 2019 Gastrointestinal Medical History: Past Medical History Notes: GERD Endocrine Medical History: Positive for: Type II Diabetes Time with diabetes: 24 years Treated with: Insulin, Oral agents Blood sugar tested every day: Yes Tested : Musculoskeletal Medical History: Positive for: Osteomyelitis Past Medical History Notes: Diabetic foot ulcer Neurologic Medical History: Positive for: Neuropathy HBO Extended History Items Eyes: Cataracts Immunizations Pneumococcal Vaccine: Received Pneumococcal Vaccination: Yes Received Pneumococcal Vaccination On or After 60th Birthday: Yes Implantable Devices Yes Family and Social History Cancer: Yes - Father; Diabetes: Yes - Father,Mother,Paternal Grandparents; Heart Disease: Yes - Father; Hereditary Spherocytosis: No; Hypertension: Yes - Father; Kidney Disease: No; Lung Disease: No; Seizures: No; Stroke: No; Thyroid Problems: No; Tuberculosis: No; Never smoker; Marital Status - Married; Alcohol Use: Rarely; Drug Use: No History; Caffeine Use: Daily; Financial Concerns: No; Food, Clothing or Shelter Needs: No; Support System Lacking: No; Transportation Concerns: No Electronic Signature(s) Signed: 10/29/2021 1:50:58 PM By: [name] MD FACS Signed: 10/29/2021 3:21:06 PM By: Samuella Bruin Entered By: [name] on 10/29/2021 13:10:34 -------------------------------------------------------------------------------- SuperBill Details Patient Name: Date of Service: NA Colin [name] 10/29/2021 Medical Record Number: [id number] Patient Account Number: [id number] Date of Birth/Sex: Treating RN: 13-Dec-1955 (66 y.o. 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 Facility Procedures CPT4 Code: 21308657 Description: 11042 - DEB SUBQ TISSUE 20 SQ CM/< ICD-10 Diagnosis Description 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 Modifier: Quantity: 1 Physician Procedures : CPT4 Code Description Modifier [id number] 99213 - WC PHYS LEVEL 3 -  EST PT 25 ICD-10 Diagnosis Description 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

## 2021-10-29 NOTE — Progress Notes (Addendum)
JUNIPER, SNYDERS ([id number]) Visit Report for 10/29/2021 Arrival Information Details Patient Name: Date of Service: NA [name] 10/29/2021 10:00 A M Medical Record Number: [id number] Patient Account Number: [id number] Date of Birth/Sex: Treating RN: 04/22/1956 (66 y.o. [name] Primary Care Eura Radabaugh: [name] Other Clinician: [name] Referring Ramsey Guadamuz: Treating Ettamae Barkett/Extender: [name] in Treatment: 9 Visit Information History Since Last Visit All ordered tests and consults were completed: Yes Patient Arrived: Knee Scooter Added or deleted any medications: No Arrival Time: 09:27 Any new allergies or adverse reactions: No Accompanied By: Wife Had a fall or experienced change in No Transfer Assistance: None activities of daily living that may affect Patient Identification Verified: Yes risk of falls: Secondary Verification Process Completed: Yes Signs or symptoms of abuse/neglect since last visito No Patient Requires Transmission-Based Precautions: No Hospitalized since last visit: No Patient Has Alerts: Yes Implantable device outside of the clinic excluding No Patient Alerts: Patient on Blood Thinner cellular tissue based products placed in the center since last visit: Pain Present Now: No Electronic Signature(s) Signed: 10/29/2021 12:09:54 PM By: [name] EMT Entered By: [name] on 10/29/2021 12:09:54 -------------------------------------------------------------------------------- Encounter Discharge Information Details Patient Name: Date of Service: NA [name] 10/29/2021 10:00 A M Medical Record Number: [id number] Patient Account Number: [id number] Date of Birth/Sex: Treating RN: 07-28-1955 (66 y.o. [name] Primary Care Solomia Harrell: [name] Other Clinician: [name] Referring Zinia Innocent: Treating Chasin Findling/Extender: [name]: 9 Encounter  Discharge Information Items Discharge Condition: Stable Ambulatory Status: Knee Scooter Discharge Destination: Home Transportation: Private Auto Accompanied By: Wife Schedule Follow-up Appointment: Yes Clinical Summary of Care: Electronic Signature(s) Signed: 10/29/2021 12:35:31 PM By: [name] EMT Entered By: [name] on 10/29/2021 12:35:31 -------------------------------------------------------------------------------- Vitals Details Patient Name: Date of Service: NA [name] 10/29/2021 10:00 A M Medical Record Number: [id number] Patient Account Number: [id number] Date of Birth/Sex: Treating RN: 05/29/1956 (66 y.o. [name] Primary Care Saraya Tirey: [name] Other Clinician: [name] Referring Gianni Mihalik: Treating Danzell Birky/Extender: [name]: 9 Vital Signs Time Taken: 09:46 Temperature (F): 98.0 Height (in): 74 Pulse (bpm): 89 Weight (lbs): 186 Respiratory Rate (breaths/min): 16 Body Mass Index (BMI): 23.9 Blood Pressure (mmHg): 116/77 Capillary Blood Glucose (mg/dl): 720 Reference Range: 80 - 120 mg / dl Electronic Signature(s) Signed: 10/29/2021 12:13:40 PM By: [name] EMT Previous Signature: 10/29/2021 12:10:56 PM Version By: [name] EMT Entered By: [name] on 10/29/2021 12:13:39

## 2021-10-30 DIAGNOSIS — T8131XA Disruption of external operation (surgical) wound, not elsewhere classified, initial encounter: Secondary | ICD-10-CM | POA: Diagnosis not present

## 2021-10-31 DIAGNOSIS — T8131XA Disruption of external operation (surgical) wound, not elsewhere classified, initial encounter: Secondary | ICD-10-CM | POA: Diagnosis not present

## 2021-11-01 ENCOUNTER — Encounter (HOSPITAL_BASED_OUTPATIENT_CLINIC_OR_DEPARTMENT_OTHER): Payer: BC Managed Care – PPO | Admitting: General Surgery

## 2021-11-01 DIAGNOSIS — L97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: Secondary | ICD-10-CM | POA: Diagnosis not present

## 2021-11-01 DIAGNOSIS — I2510 Atherosclerotic heart disease of native coronary artery without angina pectoris: Secondary | ICD-10-CM | POA: Diagnosis not present

## 2021-11-01 DIAGNOSIS — L97522 Non-pressure chronic ulcer of other part of left foot with fat layer exposed: Secondary | ICD-10-CM | POA: Diagnosis not present

## 2021-11-01 DIAGNOSIS — E11610 Type 2 diabetes mellitus with diabetic neuropathic arthropathy: Secondary | ICD-10-CM | POA: Diagnosis not present

## 2021-11-01 DIAGNOSIS — E1140 Type 2 diabetes mellitus with diabetic neuropathy, unspecified: Secondary | ICD-10-CM | POA: Diagnosis not present

## 2021-11-01 DIAGNOSIS — T8131XA Disruption of external operation (surgical) wound, not elsewhere classified, initial encounter: Secondary | ICD-10-CM | POA: Diagnosis not present

## 2021-11-01 DIAGNOSIS — Z792 Long term (current) use of antibiotics: Secondary | ICD-10-CM | POA: Diagnosis not present

## 2021-11-01 DIAGNOSIS — K219 Gastro-esophageal reflux disease without esophagitis: Secondary | ICD-10-CM | POA: Diagnosis not present

## 2021-11-01 DIAGNOSIS — M1990 Unspecified osteoarthritis, unspecified site: Secondary | ICD-10-CM | POA: Diagnosis not present

## 2021-11-01 DIAGNOSIS — E11621 Type 2 diabetes mellitus with foot ulcer: Secondary | ICD-10-CM | POA: Diagnosis not present

## 2021-11-01 DIAGNOSIS — I10 Essential (primary) hypertension: Secondary | ICD-10-CM | POA: Diagnosis not present

## 2021-11-01 DIAGNOSIS — Z7984 Long term (current) use of oral hypoglycemic drugs: Secondary | ICD-10-CM | POA: Diagnosis not present

## 2021-11-01 DIAGNOSIS — Z7982 Long term (current) use of aspirin: Secondary | ICD-10-CM | POA: Diagnosis not present

## 2021-11-01 DIAGNOSIS — Z794 Long term (current) use of insulin: Secondary | ICD-10-CM | POA: Diagnosis not present

## 2021-11-01 DIAGNOSIS — I252 Old myocardial infarction: Secondary | ICD-10-CM | POA: Diagnosis not present

## 2021-11-01 DIAGNOSIS — D649 Anemia, unspecified: Secondary | ICD-10-CM | POA: Diagnosis not present

## 2021-11-01 DIAGNOSIS — M86672 Other chronic osteomyelitis, left ankle and foot: Secondary | ICD-10-CM | POA: Diagnosis not present

## 2021-11-01 DIAGNOSIS — Z951 Presence of aortocoronary bypass graft: Secondary | ICD-10-CM | POA: Diagnosis not present

## 2021-11-01 DIAGNOSIS — E785 Hyperlipidemia, unspecified: Secondary | ICD-10-CM | POA: Diagnosis not present

## 2021-11-01 DIAGNOSIS — Z452 Encounter for adjustment and management of vascular access device: Secondary | ICD-10-CM | POA: Diagnosis not present

## 2021-11-01 DIAGNOSIS — E1169 Type 2 diabetes mellitus with other specified complication: Secondary | ICD-10-CM | POA: Diagnosis not present

## 2021-11-01 LAB — GLUCOSE, CAPILLARY
Glucose-Capillary: 222 mg/dL — ABNORMAL HIGH (ref 70–99)
Glucose-Capillary: 193 mg/dL — ABNORMAL HIGH (ref 70–99)

## 2021-11-01 NOTE — Progress Notes (Addendum)
JAMASON, PECKHAM ([id number]) Visit Report for 11/01/2021 Arrival Information Details Patient Name: Date of Service: NA [name] 11/01/2021 10:00 A M Medical Record Number: [id number] Patient Account Number: [id number] Date of Birth/Sex: Treating RN: 1955-08-26 (66 y.o. [name] Primary Care [place]ton Arch: [name] Other Clinician: [name] Referring Kaylin Schellenberg: Treating Geovani Tootle/Extender: [name] in Treatment: 9 Visit Information History Since Last Visit All ordered tests and consults were completed: Yes Patient Arrived: Knee Scooter Added or deleted any medications: No Arrival Time: 09:39 Any new allergies or adverse reactions: No Accompanied By: Wife Had a fall or experienced change in No Transfer Assistance: None activities of daily living that may affect Patient Identification Verified: Yes risk of falls: Secondary Verification Process Completed: Yes Signs or symptoms of abuse/neglect since last visito No Patient Requires Transmission-Based Precautions: No Hospitalized since last visit: No Patient Has Alerts: Yes Implantable device outside of the clinic excluding No Patient Alerts: Patient on Blood Thinner cellular tissue based products placed in the center since last visit: Pain Present Now: No Electronic Signature(s) Signed: 11/01/2021 3:14:42 PM By: [name] EMT Entered By: [name] on 11/01/2021 15:14:42 -------------------------------------------------------------------------------- Encounter Discharge Information Details Patient Name: Date of Service: NA [name] 11/01/2021 10:00 A M Medical Record Number: [id number] Patient Account Number: [id number] Date of Birth/Sex: Treating RN: 09-03-1955 (66 y.o. [name] Primary Care Raidon Swanner: [name] Other Clinician: [name] Referring Josefita Weissmann: Treating Kallan Merrick/Extender: [name] in Treatment: 9 Encounter  Discharge Information Items Discharge Condition: Stable Ambulatory Status: Ambulatory Discharge Destination: Home Transportation: Private Auto Accompanied By: Wife Schedule Follow-up Appointment: No Clinical Summary of Care: Electronic Signature(s) Signed: 11/01/2021 3:25:19 PM By: [name] EMT Entered By: [name] on 11/01/2021 15:25:19 -------------------------------------------------------------------------------- Vitals Details Patient Name: Date of Service: NA [name] 11/01/2021 10:00 A M Medical Record Number: [id number] Patient Account Number: [id number] Date of Birth/Sex: Treating RN: Oct 21, 1955 (66 y.o. [name] Primary Care Joanna Hall: [name] Other Clinician: [name] Referring Alyn Riedinger: Treating Andrea Ferrer/Extender: [name]: 9 Vital Signs Time Taken: 09:39 Temperature (F): 97.2 Height (in): 74 Pulse (bpm): 74 Weight (lbs): 186 Respiratory Rate (breaths/min): 16 Body Mass Index (BMI): 23.9 Blood Pressure (mmHg): 134/80 Capillary Blood Glucose (mg/dl): 678 Reference Range: 80 - 120 mg / dl Electronic Signature(s) Signed: 11/01/2021 3:15:16 PM By: [name] EMT Entered By: [name] on 11/01/2021 15:15:15

## 2021-11-01 NOTE — Progress Notes (Addendum)
[name] ([id number]) Visit Report for 11/01/2021 HBO Details Patient Name: Date of Service: NA [name] 11/01/2021 10:00 A M Medical Record Number: [id number] Patient Account Number: [id number] Date of Birth/Sex: Treating RN: 05-15-1956 (66 y.o. [name] Primary Care Dillyn Joaquin: [name] Other Clinician: [name] Referring Sheamus Hasting: Treating Roby Donaway/Extender: [name]: 9 HBO Treatment Course Details Treatment Course Number: 1 Ordering Everardo Voris: [name] T Treatments Ordered: otal 40 HBO Treatment Start Date: 09/20/2021 HBO Indication: Diabetic Ulcer(s) of the Lower Extremity Standard/Conservative Wound Care tried and failed greater than or equal to 30 days Wound #1 Left, Medial Foot HBO Treatment Details Treatment Number: 30 Patient Type: Outpatient Chamber Type: Monoplace Chamber Serial #: [id number] Treatment Protocol: 2.5 ATA with 90 minutes oxygen, with two 5 minute air breaks Treatment Details Compression Rate Down: 2.0 psi / minute De-Compression Rate Up: 2.0 psi / minute A breaks and breathing ir Compress Tx Pressure periods Decompress Decompress Begins Reached (leave unused spaces Begins Ends blank) Chamber Pressure (ATA 1 2.5 2.5 2.5 2.5 2.5 - - 2.5 1 ) Clock Time (24 hr) 09:43 09:54 10:24 10:29 10:59 11:04 - - 11:34 11:45 Treatment Length: 122 (minutes) Treatment Segments: 4 Vital Signs Capillary Blood Glucose Reference Range: 80 - 120 mg / dl HBO Diabetic Blood Glucose Intervention Range: <131 mg/dl or >710 mg/dl Time Vitals Blood Respiratory Capillary Blood Glucose Pulse Action Type: Pulse: Temperature: Taken: Pressure: Rate: Glucose (mg/dl): Meter #: Oximetry (%) Taken: Pre 09:39 134/80 74 16 97.2 222 Post 11:50 166/88 71 16 97.4 193 Treatment Response Treatment Toleration: Well Treatment Completion Status: Treatment Completed without Adverse Event Additional Procedure  Documentation Tissue Sevierity: Necrosis of bone Physician HBO Attestation: I certify that I supervised this HBO treatment in accordance with Medicare guidelines. A trained emergency response team is readily available per Yes hospital policies and procedures. Continue HBOT as ordered. Yes Electronic Signature(s) Signed: 11/01/2021 3:58:23 PM By: [name] MD FACS Previous Signature: 11/01/2021 3:20:07 PM Version By: [name] EMT Entered By: [name] on 11/01/2021 15:58:23 -------------------------------------------------------------------------------- HBO Safety Checklist Details Patient Name: Date of Service: [name] A. 11/01/2021 10:00 A M Medical Record Number: [id number] Patient Account Number: [id number] Date of Birth/Sex: Treating RN: 03/10/1956 (66 y.o. [name] Primary Care Jeremian Whitby: [name] Other Clinician: [name] Referring Serge Main: Treating Lennon Richins/Extender: [name]: 9 HBO Safety Checklist Items Safety Checklist Consent Form Signed Patient voided / foley secured and emptied When did you last eato 0720 Last dose of injectable or oral agent 0720 Ostomy pouch emptied and vented if applicable NA All implantable devices assessed, documented and approved NA Intravenous access site secured and place NA Valuables secured Linens and cotton and cotton/polyester blend (less than 51% polyester) Personal oil-based products / skin lotions / body lotions removed Wigs or hairpieces removed NA Smoking or tobacco materials removed Books / newspapers / magazines / loose paper removed Cologne, aftershave, perfume and deodorant removed Jewelry removed (may wrap wedding band) NA Make-up removed NA Hair care products removed Battery operated devices (external) removed Heating patches and chemical warmers removed Titanium eyewear removed NA Nail polish cured greater than 10  hours NA Casting material cured greater than 10 hours NA Hearing aids removed NA Loose dentures or partials removed NA Prosthetics have been removed NA Patient demonstrates correct use of air break device (if applicable) Patient concerns have been addressed Patient grounding bracelet on and cord  attached to chamber Specifics for Inpatients (complete in addition to above) Medication sheet sent with patient NA Intravenous medications needed or due during therapy sent with patient NA Drainage tubes (e.g. nasogastric tube or chest tube secured and vented) NA Endotracheal or Tracheotomy tube secured NA Cuff deflated of air and inflated with saline NA Airway suctioned NA Notes Safety checklist was done before treatment started. Electronic Signature(s) Signed: 11/01/2021 3:17:18 PM By: [name] EMT Entered By: [name] on 11/01/2021 15:17:18

## 2021-11-01 NOTE — Progress Notes (Signed)
DVANTE, HANDS ([id number]) Visit Report for 11/01/2021 Problem List Details Patient Name: Date of Service: NA [name] 11/01/2021 10:00 A M Medical Record Number: [id number] Patient Account Number: [id number] Date of Birth/Sex: Treating RN: 1956-06-07 (66 y.o. 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 11/01/2021 Medical Record Number: [id number] Patient Account Number: [id number] Date of Birth/Sex: Treating RN: Aug 21, 1955 (66 y.o. 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 Facility Procedures CPT4 Code: 63335456 Description: G0277-(Facility Use Only) HBOT full body chamber, , ICD-10 Diagnosis Description E11.621 Type 2 diabetes mellitus with foot ulcer 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 M86.672 Other chronic  osteomyelitis, left ankle and foot 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

## 2021-11-02 ENCOUNTER — Encounter (HOSPITAL_BASED_OUTPATIENT_CLINIC_OR_DEPARTMENT_OTHER): Payer: BC Managed Care – PPO | Admitting: General Surgery

## 2021-11-02 DIAGNOSIS — T8131XA Disruption of external operation (surgical) wound, not elsewhere classified, initial encounter: Secondary | ICD-10-CM | POA: Diagnosis not present

## 2021-11-02 DIAGNOSIS — E11621 Type 2 diabetes mellitus with foot ulcer: Secondary | ICD-10-CM | POA: Diagnosis not present

## 2021-11-02 DIAGNOSIS — Z951 Presence of aortocoronary bypass graft: Secondary | ICD-10-CM | POA: Diagnosis not present

## 2021-11-02 DIAGNOSIS — L97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: Secondary | ICD-10-CM | POA: Diagnosis not present

## 2021-11-02 DIAGNOSIS — E11610 Type 2 diabetes mellitus with diabetic neuropathic arthropathy: Secondary | ICD-10-CM | POA: Diagnosis not present

## 2021-11-02 DIAGNOSIS — I2510 Atherosclerotic heart disease of native coronary artery without angina pectoris: Secondary | ICD-10-CM | POA: Diagnosis not present

## 2021-11-02 DIAGNOSIS — M86672 Other chronic osteomyelitis, left ankle and foot: Secondary | ICD-10-CM | POA: Diagnosis not present

## 2021-11-02 DIAGNOSIS — E1169 Type 2 diabetes mellitus with other specified complication: Secondary | ICD-10-CM | POA: Diagnosis not present

## 2021-11-02 LAB — GLUCOSE, CAPILLARY
Glucose-Capillary: 217 mg/dL — ABNORMAL HIGH (ref 70–99)
Glucose-Capillary: 220 mg/dL — ABNORMAL HIGH (ref 70–99)

## 2021-11-02 NOTE — Progress Notes (Addendum)
KYMARI, ANDREN ([id number]) Visit Report for 11/02/2021 HBO Details Patient Name: Date of Service: NA [name] 11/02/2021 10:00 A M Medical Record Number: [id number] Patient Account Number: [id number] Date of Birth/Sex: Treating RN: 1956-01-04 (66 y.o. [name] Primary Care Johnpaul Gillentine: [name] Other Clinician: [name] Referring Trevor Duty: Treating Olene Godfrey/Extender: [name] Weeks in Treatment: 9 HBO Treatment Course Details Treatment Course Number: 1 Ordering Orey Moure: [name] T Treatments Ordered: otal 40 HBO Treatment Start Date: 09/20/2021 HBO Indication: Diabetic Ulcer(s) of the Lower Extremity Standard/Conservative Wound Care tried and failed greater than or equal to 30 days Wound #1 Left, Medial Foot HBO Treatment Details Treatment Number: 31 Patient Type: Outpatient Chamber Type: Monoplace Chamber Serial #: [id number] Treatment Protocol: 2.5 ATA with 90 minutes oxygen, with two 5 minute air breaks Treatment Details Compression Rate Down: 2.0 psi / minute De-Compression Rate Up: 2.0 psi / minute A breaks and breathing ir Compress Tx Pressure periods Decompress Decompress Begins Reached (leave unused spaces Begins Ends blank) Chamber Pressure (ATA 1 2.5 2.5 2.5 2.5 2.5 - - 2.5 1 ) Clock Time (24 hr) 09:52 10:04 10:34 10:39 11:09 11:14 - - 11:44 11:55 Treatment Length: 123 (minutes) Treatment Segments: 4 Vital Signs Capillary Blood Glucose Reference Range: 80 - 120 mg / dl HBO Diabetic Blood Glucose Intervention Range: <131 mg/dl or >249 mg/dl Time Vitals Blood Respiratory Capillary Blood Glucose Pulse Action Type: Pulse: Temperature: Taken: Pressure: Rate: Glucose (mg/dl): Meter #: Oximetry (%) Taken: Pre 09:41 118/83 89 18 98.1 217 Post 12:01 158/84 77 16 98.2 220 Treatment Response Treatment Toleration: Well Treatment Completion Status: Treatment Completed without Adverse Event Treatment Notes no  problems with treatment noted today. Additional Procedure Documentation Tissue Sevierity: Necrosis of bone Physician HBO Attestation: I certify that I supervised this HBO treatment in accordance with Medicare guidelines. A trained emergency response team is readily available per Yes hospital policies and procedures. Continue HBOT as ordered. Yes Electronic Signature(s) Signed: 11/02/2021 5:25:12 PM By: [name] MD FACS Previous Signature: 11/02/2021 3:30:01 PM Version By: [name] EMT Entered By: [name] on 11/02/2021 17:25:12 -------------------------------------------------------------------------------- HBO Safety Checklist Details Patient Name: Date of Service: Colin Loveless A. 11/02/2021 10:00 A M Medical Record Number: [id number] Patient Account Number: [id number] Date of Birth/Sex: Treating RN: 01-29-1956 (66 y.o. [name] Primary Care Tya Haughey: [name] Other Clinician: [name] Referring Karmin Kasprzak: Treating Gunter Conde/Extender: [name] Weeks in Treatment: 9 HBO Safety Checklist Items Safety Checklist Consent Form Signed Patient voided / foley secured and emptied When did you last eato 0715 Last dose of injectable or oral agent 0715 Ostomy pouch emptied and vented if applicable NA All implantable devices assessed, documented and approved NA Intravenous access site secured and place NA Valuables secured Linens and cotton and cotton/polyester blend (less than 51% polyester) Personal oil-based products / skin lotions / body lotions removed Wigs or hairpieces removed NA Smoking or tobacco materials removed Books / newspapers / magazines / loose paper removed Cologne, aftershave, perfume and deodorant removed Jewelry removed (may wrap wedding band) NA Make-up removed NA Hair care products removed Battery operated devices (external) removed Heating patches and chemical warmers removed Titanium eyewear  removed NA Nail polish cured greater than 10 hours NA Casting material cured greater than 10 hours NA Hearing aids removed NA Loose dentures or partials removed NA Prosthetics have been removed NA Patient demonstrates correct use of air break device (if applicable) Patient concerns have  been addressed Patient grounding bracelet on and cord attached to chamber Specifics for Inpatients (complete in addition to above) Medication sheet sent with patient NA Intravenous medications needed or due during therapy sent with patient NA Drainage tubes (e.g. nasogastric tube or chest tube secured and vented) NA Endotracheal or Tracheotomy tube secured NA Cuff deflated of air and inflated with saline NA Airway suctioned NA Electronic Signature(s) Signed: 11/02/2021 3:22:36 PM By: [name] EMT Entered By: [name] on 11/02/2021 15:22:36

## 2021-11-02 NOTE — Progress Notes (Signed)
RAMESES, OU ([id number]) Visit Report for 11/02/2021 Arrival Information Details Patient Name: Date of Service: NA [name] 11/02/2021 10:00 A M Medical Record Number: [id number] Patient Account Number: [id number] Date of Birth/Sex: Treating RN: 1955-12-26 (66 y.o. [name] Primary Care Kiernan Atkerson: [name] Other Clinician: [name] Referring Ingrid Shifrin: Treating Evaristo Tsuda/Extender: [name] in Treatment: 9 Visit Information History Since Last Visit All ordered tests and consults were completed: Yes Patient Arrived: Knee Scooter Added or deleted any medications: No Arrival Time: 09:28 Any new allergies or adverse reactions: No Accompanied By: Wife Had a fall or experienced change in No Transfer Assistance: None activities of daily living that may affect Patient Identification Verified: Yes risk of falls: Secondary Verification Process Completed: Yes Signs or symptoms of abuse/neglect since last visito No Patient Requires Transmission-Based Precautions: No Hospitalized since last visit: No Patient Has Alerts: Yes Implantable device outside of the clinic excluding No Patient Alerts: Patient on Blood Thinner cellular tissue based products placed in the center since last visit: Pain Present Now: No Electronic Signature(s) Signed: 11/02/2021 3:20:44 PM By: [name] EMT Entered By: [name] on 11/02/2021 15:20:43 -------------------------------------------------------------------------------- Encounter Discharge Information Details Patient Name: Date of Service: NA Colin Colin Halsted A. 11/02/2021 10:00 A M Medical Record Number: [id number] Patient Account Number: [id number] Date of Birth/Sex: Treating RN: [birth year] (66 y.o. [name] Primary Care Janani Chamber: [name] Other Clinician: [name] Referring Senora Lacson: Treating Michaelia Beilfuss/Extender: [name] in Treatment: 9 Encounter  Discharge Information Items Discharge Condition: Stable Ambulatory Status: Knee Scooter Discharge Destination: Home Transportation: Private Auto Accompanied By: Wife Schedule Follow-up Appointment: Yes Clinical Summary of Care: Electronic Signature(s) Signed: 11/02/2021 3:31:31 PM By: [name] EMT Entered By: [name] on 11/02/2021 15:31:31 -------------------------------------------------------------------------------- Vitals Details Patient Name: Date of Service: NA Colin Colin Halsted A. 11/02/2021 10:00 A M Medical Record Number: [id number] Patient Account Number: [id number] Date of Birth/Sex: Treating RN: [birth year] (66 y.o. [name] Primary Care Ollis Daudelin: [name] Other Clinician: [name] Referring Eren Ryser: Treating Nadeen Shipman/Extender: [name]: 9 Vital Signs Time Taken: 09:41 Temperature (F): 98.1 Height (in): 74 Pulse (bpm): 89 Weight (lbs): 186 Respiratory Rate (breaths/min): 18 Body Mass Index (BMI): 23.9 Blood Pressure (mmHg): 118/83 Capillary Blood Glucose (mg/dl): 376 Reference Range: 80 - 120 mg / dl Electronic Signature(s) Signed: 11/02/2021 3:21:13 PM By: [name] EMT Entered By: [name] on 11/02/2021 15:21:13

## 2021-11-02 NOTE — Progress Notes (Signed)
YOUNES, DEGEORGE ([id number]) Visit Report for 11/02/2021 Problem List Details Patient Name: Date of Service: NA [name] 11/02/2021 10:00 A M Medical Record Number: [id number] Patient Account Number: [id number] Date of Birth/Sex: Treating RN: [birth year] (65 y.o. 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 11/02/2021 Medical Record Number: [id number] Patient Account Number: [id number] Date of Birth/Sex: Treating RN: 1955/07/02 (66 y.o. 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 Facility Procedures CPT4 Code: 16010932 Description: G0277-(Facility Use Only) HBOT full body chamber, , ICD-10 Diagnosis Description E11.621 Type 2 diabetes mellitus with foot ulcer 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 M86.672 Other chronic  osteomyelitis, left ankle and foot 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

## 2021-11-03 ENCOUNTER — Telehealth (INDEPENDENT_AMBULATORY_CARE_PROVIDER_SITE_OTHER): Payer: BC Managed Care – PPO | Admitting: Internal Medicine

## 2021-11-03 ENCOUNTER — Encounter (HOSPITAL_BASED_OUTPATIENT_CLINIC_OR_DEPARTMENT_OTHER): Payer: BC Managed Care – PPO | Admitting: General Surgery

## 2021-11-03 ENCOUNTER — Other Ambulatory Visit: Payer: Self-pay

## 2021-11-03 ENCOUNTER — Encounter: Payer: Self-pay | Admitting: Internal Medicine

## 2021-11-03 DIAGNOSIS — L97522 Non-pressure chronic ulcer of other part of left foot with fat layer exposed: Secondary | ICD-10-CM | POA: Diagnosis not present

## 2021-11-03 DIAGNOSIS — M86472 Chronic osteomyelitis with draining sinus, left ankle and foot: Secondary | ICD-10-CM | POA: Diagnosis not present

## 2021-11-03 DIAGNOSIS — E1140 Type 2 diabetes mellitus with diabetic neuropathy, unspecified: Secondary | ICD-10-CM | POA: Diagnosis not present

## 2021-11-03 DIAGNOSIS — Z452 Encounter for adjustment and management of vascular access device: Secondary | ICD-10-CM | POA: Diagnosis not present

## 2021-11-03 DIAGNOSIS — M86672 Other chronic osteomyelitis, left ankle and foot: Secondary | ICD-10-CM | POA: Diagnosis not present

## 2021-11-03 DIAGNOSIS — Z792 Long term (current) use of antibiotics: Secondary | ICD-10-CM | POA: Diagnosis not present

## 2021-11-03 DIAGNOSIS — E1169 Type 2 diabetes mellitus with other specified complication: Secondary | ICD-10-CM | POA: Diagnosis not present

## 2021-11-03 DIAGNOSIS — K219 Gastro-esophageal reflux disease without esophagitis: Secondary | ICD-10-CM | POA: Diagnosis not present

## 2021-11-03 DIAGNOSIS — M1990 Unspecified osteoarthritis, unspecified site: Secondary | ICD-10-CM | POA: Diagnosis not present

## 2021-11-03 DIAGNOSIS — E11610 Type 2 diabetes mellitus with diabetic neuropathic arthropathy: Secondary | ICD-10-CM | POA: Diagnosis not present

## 2021-11-03 DIAGNOSIS — I10 Essential (primary) hypertension: Secondary | ICD-10-CM | POA: Diagnosis not present

## 2021-11-03 DIAGNOSIS — D649 Anemia, unspecified: Secondary | ICD-10-CM | POA: Diagnosis not present

## 2021-11-03 DIAGNOSIS — I252 Old myocardial infarction: Secondary | ICD-10-CM | POA: Diagnosis not present

## 2021-11-03 DIAGNOSIS — E785 Hyperlipidemia, unspecified: Secondary | ICD-10-CM | POA: Diagnosis not present

## 2021-11-03 DIAGNOSIS — E11621 Type 2 diabetes mellitus with foot ulcer: Secondary | ICD-10-CM | POA: Diagnosis not present

## 2021-11-03 DIAGNOSIS — I2510 Atherosclerotic heart disease of native coronary artery without angina pectoris: Secondary | ICD-10-CM | POA: Diagnosis not present

## 2021-11-03 DIAGNOSIS — Z794 Long term (current) use of insulin: Secondary | ICD-10-CM | POA: Diagnosis not present

## 2021-11-03 DIAGNOSIS — L97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: Secondary | ICD-10-CM | POA: Diagnosis not present

## 2021-11-03 DIAGNOSIS — Z951 Presence of aortocoronary bypass graft: Secondary | ICD-10-CM | POA: Diagnosis not present

## 2021-11-03 DIAGNOSIS — Z7982 Long term (current) use of aspirin: Secondary | ICD-10-CM | POA: Diagnosis not present

## 2021-11-03 DIAGNOSIS — T8131XA Disruption of external operation (surgical) wound, not elsewhere classified, initial encounter: Secondary | ICD-10-CM | POA: Diagnosis not present

## 2021-11-03 DIAGNOSIS — Z7984 Long term (current) use of oral hypoglycemic drugs: Secondary | ICD-10-CM | POA: Diagnosis not present

## 2021-11-03 LAB — GLUCOSE, CAPILLARY
Glucose-Capillary: 213 mg/dL — ABNORMAL HIGH (ref 70–99)
Glucose-Capillary: 256 mg/dL — ABNORMAL HIGH (ref 70–99)

## 2021-11-03 NOTE — Progress Notes (Signed)
ADARSH, MUNDORF ([id number]) Visit Report for 11/03/2021 Problem List Details Patient Name: Date of Service: NA [name] 11/03/2021 10:00 A M Medical Record Number: [id number] Patient Account Number: [id number] Date of Birth/Sex: Treating RN: [birth year] (66 y.o. 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 11/03/2021 Medical Record Number: [id number] Patient Account Number: [id number] Date of Birth/Sex: Treating RN: 1956/01/29 (66 y.o. 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 Facility Procedures CPT4 Code: 84210312 Description: G0277-(Facility Use Only) HBOT full body chamber, , ICD-10 Diagnosis Description E11.621 Type 2 diabetes mellitus with foot ulcer 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 M86.672 Other chronic  osteomyelitis, left ankle and foot 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

## 2021-11-03 NOTE — Progress Notes (Signed)
[facility]  Reason for visit: Osteomyelitis   HPI:    [name] is a 66 y.o. male with PMHx as below who presents to the clinic for osteomyelitis.   Patient has a history of diabetic foot wound complicated by osteomyelitis.  He was seen by myself as a new patient in March 2023.    He has undergone multiple surgeries of this wound with podiatry since October 2022.  He underwent debridement and irrigation of his wound down to the bone with application of skin graft substitute and wound VAC on 07/28/2021 with Dr March Rummage.  He was noted to have a left medial ankle wound measuring 4.5 cm x 4 cm with granular fibrotic base and some serosanguineous drainage at his follow up.  There was mild erythema noted.  Pathology from his left talus bone was consistent with osteomyelitis and cultures were positive for diphtheroids.  Patient was prescribed doxycycline 100 mg twice daily which he took for approximately 2 weeks starting 08/02/2021.  He presented to [place] for further evaluation and antibiotic recommendations regarding his left foot infection in March and was prescribed broad coverage via PICC line with daptomycin and cefepime x 6 weeks which he completed.  He saw Dr Tommy Medal on 4/19 and he reported improvement in his wound despite inflammatory markers not changing much. He was transitioned to Augmentin and Doxycycline following IV therapy and presents today.  He has tolerated these antibiotics thus far and reports wound is not worsening.  His wound care notes from 5/19 report   "undermining continues to improve. The tunnels are about the same. He has good granulation tissue overlying the majority of the exposed bone. It does appear that perhaps the tubing from his wound VAC has been eroding the skin at the 12 clock position. He continues to accumulate senescent epithelium around the borders of the wound."  He is completing his 6th round of HBO therapy.  States wounds are  improving regularly with some heatlhy appearing pink tissue.  He is pleased with the progress.   Patient's Medications  New Prescriptions   No medications on file  Previous Medications   ACETAMINOPHEN (TYLENOL) 500 MG TABLET    Take 2 tablets (1,000 mg total) by mouth every 6 (six) hours as needed for mild pain or fever.   AMOXICILLIN-CLAVULANATE (AUGMENTIN) 875-125 MG TABLET    Take 1 tablet by mouth 2 (two) times daily.   ASPIRIN EC 81 MG TABLET    Take 81 mg by mouth daily.   ATORVASTATIN (LIPITOR) 80 MG TABLET    Take 80 mg by mouth daily.   CALCIUM CARBONATE (TUMS - DOSED IN MG ELEMENTAL CALCIUM) 500 MG CHEWABLE TABLET    Chew 1-2 tablets by mouth daily as needed for indigestion or heartburn.   DOXYCYCLINE (VIBRA-TABS) 100 MG TABLET    Take 1 tablet (100 mg total) by mouth 2 (two) times daily.   IBUPROFEN (ADVIL) 200 MG TABLET    Take 400 mg by mouth every 8 (eight) hours as needed (pain.).   LANTUS 100 UNIT/ML INJECTION    Inject 30 Units into the skin 2 (two) times daily.   LISINOPRIL (ZESTRIL) 10 MG TABLET    Take 10 mg by mouth in the morning.   METFORMIN (GLUCOPHAGE) 850 MG TABLET    Take 850 mg by mouth in the morning, at noon, and at bedtime.   METOPROLOL SUCCINATE (TOPROL-XL) 50 MG 24 HR TABLET  Take 50 mg by mouth daily. Take with or immediately following a meal.   SITAGLIPTIN (JANUVIA) 100 MG TABLET    Take 100 mg by mouth every morning.    TETRAHYDROZOLINE HCL (VISINE OP)    Place 1 drop into both eyes daily as needed (redness).  Modified Medications   No medications on file  Discontinued Medications   No medications on file      Past Medical History:  Diagnosis Date   Coronary artery disease    Diabetes mellitus without complication (HCC)    Type II   GERD (gastroesophageal reflux disease)    Hypertension    Myocardial infarction ([place])    "Silent"   Neuropathy     Social History   Tobacco Use   Smoking status: Never   Smokeless tobacco: Never  Vaping Use    Vaping Use: Never used  Substance Use Topics   Alcohol use: Never   Drug use: Never    Family History  Problem Relation Age of Onset   Diabetes Mother    Diabetes Father    Heart disease Father    Diabetes Daughter     Allergies  Allergen Reactions   Codeine Sulfate [Codeine] Nausea Only    Review of Systems  Constitutional: Negative.   Gastrointestinal: Negative.   Skin:        Positive for foot wound  All other systems reviewed and are negative.    OBJECTIVE:    There were no vitals filed for this visit.    There is no height or weight on file to calculate BMI.  Physical Exam He sounds in good spirits on the phone.  Video was not able to be connected.   Labs and Microbiology:     Latest Ref Rng & Units 08/16/2021    3:57 PM 07/28/2021   12:56 PM 07/02/2021    2:20 PM  CBC  WBC 3.8 - 10.8 Thousand/uL 8.6   7.6   8.7    Hemoglobin 13.2 - 17.1 g/dL 12.4   12.2   12.5    Hematocrit 38.5 - 50.0 % 38.3   39.3   39.3    Platelets 140 - 400 Thousand/uL 263   230   270        Latest Ref Rng & Units 08/16/2021    3:57 PM 07/28/2021   12:56 PM 07/06/2021    8:00 AM  CMP  Glucose 65 - 99 mg/dL 236   165   83    BUN 7 - 25 mg/dL 40   28   33    Creatinine 0.70 - 1.35 mg/dL 0.97   0.82   0.81    Sodium 135 - 146 mmol/L 138   135   139    Potassium 3.5 - 5.3 mmol/L 5.3   4.8   3.9    Chloride 98 - 110 mmol/L 101   105   105    CO2 20 - 32 mmol/L 24   26   28     Calcium 8.6 - 10.3 mg/dL 9.1   8.7   8.6    Total Protein 6.1 - 8.1 g/dL 6.2    6.5    Total Bilirubin 0.2 - 1.2 mg/dL 0.4    0.5    Alkaline Phos 38 - 126 U/L   72    AST 10 - 35 U/L 12    19    ALT 9 - 46 U/L 16    19  ASSESSMENT & PLAN:    Osteomyelitis ([place]) He completed 6 weeks of IV therapy with daptomycin and cefepime.  He is currently on a suppressive regimen of doxycycline and Augmentin.  Discussed with patient the risk of needing BKA to definitively deal with this process which he  remains opposed to but fortunately his wounds are continuing to improve and hopefully will avoid this type of surgery.  Will continue doxycycline and Augmentin for now (was given 3 month supply in April) and follow up in 2 months.  He will continue wound care and podiatry follow up.     [facility] 11/03/2021, 2:44 PM   Virtual Visit via Phone Note   I connected with [name] on 11/03/21 at 2:44 PM by phone and verified that I am speaking with the correct person using two identifiers.   I discussed the limitations, risks, security and privacy concerns of performing an evaluation and management service by telephone and the availability of in person appointments. I also discussed with the patient that there may be a patient responsible charge related to this service. The patient expressed understanding and agreed to proceed.  Patient location: home My location: [facility] Duration of call: 9 min on phone. Spent total of 20 min on chart today

## 2021-11-03 NOTE — Progress Notes (Signed)
[name], [name] ([id number]) Visit Report for 11/03/2021 Arrival Information Details Patient Name: Date of Service: NA [name] 11/03/2021 10:00 A M Medical Record Number: [id number] Patient Account Number: [id number] Date of Birth/Sex: Treating RN: 1956/05/06 (66 y.o. [name] Primary Care Laisha Rau: [name] Other Clinician: [name] Referring Candid Bovey: Treating Luddie Boghosian/Extender: [name] in Treatment: 10 Visit Information History Since Last Visit All ordered tests and consults were completed: Yes Patient Arrived: Knee Scooter Added or deleted any medications: No Arrival Time: 09:37 Any new allergies or adverse reactions: No Accompanied By: Wife Had a fall or experienced change in No Transfer Assistance: None activities of daily living that may affect Patient Identification Verified: Yes risk of falls: Secondary Verification Process Completed: Yes Signs or symptoms of abuse/neglect since last visito No Patient Requires Transmission-Based Precautions: No Hospitalized since last visit: No Patient Has Alerts: Yes Implantable device outside of the clinic excluding No Patient Alerts: Patient on Blood Thinner cellular tissue based products placed in the center since last visit: Pain Present Now: No Electronic Signature(s) Signed: 11/03/2021 5:16:36 PM By: [name] EMT Entered By: [name] on 11/03/2021 17:16:36 -------------------------------------------------------------------------------- Encounter Discharge Information Details Patient Name: Date of Service: [name] 11/03/2021 10:00 A M Medical Record Number: [id number] Patient Account Number: [id number] Date of Birth/Sex: Treating RN: 06/02/1956 (66 y.o. [name] Primary Care Eulanda Dorion: [name] Other Clinician: [name] Referring Altair Stanko: Treating Avyukt Cimo/Extender: [name] Weeks in Treatment: 10 Encounter  Discharge Information Items Discharge Condition: Stable Ambulatory Status: Knee Scooter Discharge Destination: Home Transportation: Private Auto Accompanied By: Wife Schedule Follow-up Appointment: No Clinical Summary of Care: Electronic Signature(s) Signed: 11/03/2021 5:21:42 PM By: [name] EMT Entered By: [name] on 11/03/2021 17:21:41 -------------------------------------------------------------------------------- Vitals Details Patient Name: Date of Service: NA Colin [name] 11/03/2021 10:00 A M Medical Record Number: [id number] Patient Account Number: [id number] Date of Birth/Sex: Treating RN: October 05, 1955 (66 y.o. [name] Primary Care Jaquavis Felmlee: [name] Other Clinician: [name] Referring Damarrion Mimbs: Treating Devaunte Gasparini/Extender: [name] Weeks in Treatment: 10 Vital Signs Time Taken: 09:51 Temperature (F): 97.7 Height (in): 74 Pulse (bpm): 93 Weight (lbs): 186 Respiratory Rate (breaths/min): 16 Body Mass Index (BMI): 23.9 Blood Pressure (mmHg): 133/85 Reference Range: 80 - 120 mg / dl Electronic Signature(s) Signed: 11/03/2021 5:17:31 PM By: [name] EMT Entered By: [name] on 11/03/2021 17:17:31

## 2021-11-03 NOTE — Assessment & Plan Note (Addendum)
He completed 6 weeks of IV therapy with daptomycin and cefepime.  He is currently on a suppressive regimen of doxycycline and Augmentin.  Discussed previously with patient the risk of needing BKA to definitively deal with this process which he remains opposed to but fortunately his wounds are continuing to improve and hopefully will avoid this type of surgery.  Will continue doxycycline and Augmentin for now (was given 3 month supply in April) and follow up in 2 months.  He will continue wound care and podiatry follow up.

## 2021-11-03 NOTE — Progress Notes (Signed)
[name], GOETSCHIUS ([id number]) Visit Report for 11/03/2021 HBO Details Patient Name: Date of Service: NA [name] 11/03/2021 10:00 A M Medical Record Number: [id number] Patient Account Number: [id number] Date of Birth/Sex: Treating RN: [birth year] (66 y.o. [name] Primary Care [place]: [name] Other Clinician: [name] Referring Zeplin Aleshire: Treating Ted Goodner/Extender: [name] Weeks in Treatment: 10 HBO Treatment Course Details Treatment Course Number: 1 Ordering Wrenn Willcox: [name] T Treatments Ordered: otal 40 HBO Treatment Start Date: 09/20/2021 HBO Indication: Diabetic Ulcer(s) of the Lower Extremity Standard/Conservative Wound Care tried and failed greater than or equal to 30 days Wound #1 Left, Medial Foot HBO Treatment Details Treatment Number: 32 Patient Type: Outpatient Chamber Type: Monoplace Chamber Serial #: [id number] Treatment Protocol: 2.5 ATA with 90 minutes oxygen, with two 5 minute air breaks Treatment Details Compression Rate Down: 2.0 psi / minute De-Compression Rate Up: 2.0 psi / minute A breaks and breathing ir Compress Tx Pressure periods Decompress Decompress Begins Reached (leave unused spaces Begins Ends blank) Chamber Pressure (ATA 1 2.5 2.5 2.5 2.5 2.5 - - 2.5 1 ) Clock Time (24 hr) 09:55 10:09 10:39 10:44 11:14 11:19 - - 11:49 12:00 Treatment Length: 125 (minutes) Treatment Segments: 4 Vital Signs Capillary Blood Glucose Reference Range: 80 - 120 mg / dl HBO Diabetic Blood Glucose Intervention Range: <131 mg/dl or >249 mg/dl Time Vitals Blood Respiratory Capillary Blood Glucose Pulse Action Type: Pulse: Temperature: Taken: Pressure: Rate: Glucose (mg/dl): Meter #: Oximetry (%) Taken: Pre 09:51 133/85 93 16 97.7 Post 12:04 166/86 82 16 97.3 256 Treatment Response Treatment Toleration: Well Treatment Completion Status: Treatment Completed without Adverse Event Additional Procedure  Documentation Tissue Sevierity: Necrosis of bone Physician HBO Attestation: I certify that I supervised this HBO treatment in accordance with Medicare guidelines. A trained emergency response team is readily available per Yes hospital policies and procedures. Continue HBOT as ordered. Yes Electronic Signature(s) Signed: 11/03/2021 6:02:35 PM By: [name] MD FACS Previous Signature: 11/03/2021 5:20:21 PM Version By: [name] EMT Previous Signature: 11/03/2021 5:20:02 PM Version By: [name] EMT Entered By: [name] on 11/03/2021 18:02:35 -------------------------------------------------------------------------------- HBO Safety Checklist Details Patient Name: Date of Service: [name] A. 11/03/2021 10:00 A M Medical Record Number: [id number] Patient Account Number: [id number] Date of Birth/Sex: Treating RN: 05-24-1956 (66 y.o. [name] Primary Care Odies Desa: [name] Other Clinician: [name] Referring Jeancarlo Leffler: Treating Ch[place]wuma Straus/Extender: [name] Weeks in Treatment: 10 HBO Safety Checklist Items Safety Checklist Consent Form Signed Patient voided / foley secured and emptied When did you last eato 0645 Last dose of injectable or oral agent 0645 Ostomy pouch emptied and vented if applicable NA All implantable devices assessed, documented and approved NA Intravenous access site secured and place NA Valuables secured Linens and cotton and cotton/polyester blend (less than 51% polyester) Personal oil-based products / skin lotions / body lotions removed Wigs or hairpieces removed NA Smoking or tobacco materials removed Books / newspapers / magazines / loose paper removed Cologne, aftershave, perfume and deodorant removed Jewelry removed (may wrap wedding band) NA Make-up removed NA Hair care products removed Battery operated devices (external) removed Heating patches and chemical warmers  removed Titanium eyewear removed NA Nail polish cured greater than 10 hours NA Casting material cured greater than 10 hours NA Hearing aids removed NA Loose dentures or partials removed NA Prosthetics have been removed NA Patient demonstrates correct use of air break device (if applicable) Patient concerns  have been addressed Patient grounding bracelet on and cord attached to chamber Specifics for Inpatients (complete in addition to above) Medication sheet sent with patient NA Intravenous medications needed or due during therapy sent with patient NA Drainage tubes (e.g. nasogastric tube or chest tube secured and vented) NA Endotracheal or Tracheotomy tube secured NA Cuff deflated of air and inflated with saline NA Airway suctioned NA Notes The safety checklist was done before treatment started. Electronic Signature(s) Signed: 11/03/2021 5:18:50 PM By: [name] EMT Entered By: [name] on 11/03/2021 17:18:50

## 2021-11-04 ENCOUNTER — Encounter (HOSPITAL_BASED_OUTPATIENT_CLINIC_OR_DEPARTMENT_OTHER): Payer: BC Managed Care – PPO | Admitting: General Surgery

## 2021-11-04 DIAGNOSIS — E11621 Type 2 diabetes mellitus with foot ulcer: Secondary | ICD-10-CM | POA: Diagnosis not present

## 2021-11-04 DIAGNOSIS — T8131XA Disruption of external operation (surgical) wound, not elsewhere classified, initial encounter: Secondary | ICD-10-CM | POA: Diagnosis not present

## 2021-11-04 DIAGNOSIS — E11610 Type 2 diabetes mellitus with diabetic neuropathic arthropathy: Secondary | ICD-10-CM | POA: Diagnosis not present

## 2021-11-04 DIAGNOSIS — M86672 Other chronic osteomyelitis, left ankle and foot: Secondary | ICD-10-CM | POA: Diagnosis not present

## 2021-11-04 DIAGNOSIS — I2510 Atherosclerotic heart disease of native coronary artery without angina pectoris: Secondary | ICD-10-CM | POA: Diagnosis not present

## 2021-11-04 DIAGNOSIS — Z951 Presence of aortocoronary bypass graft: Secondary | ICD-10-CM | POA: Diagnosis not present

## 2021-11-04 DIAGNOSIS — L97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: Secondary | ICD-10-CM | POA: Diagnosis not present

## 2021-11-04 DIAGNOSIS — E1169 Type 2 diabetes mellitus with other specified complication: Secondary | ICD-10-CM | POA: Diagnosis not present

## 2021-11-04 LAB — GLUCOSE, CAPILLARY
Glucose-Capillary: 203 mg/dL — ABNORMAL HIGH (ref 70–99)
Glucose-Capillary: 209 mg/dL — ABNORMAL HIGH (ref 70–99)

## 2021-11-04 NOTE — Progress Notes (Addendum)
MANSUR, PATTI ([id number]) Visit Report for 11/04/2021 Arrival Information Details Patient Name: Date of Service: NA [name] 11/04/2021 10:00 A M Medical Record Number: [id number] Patient Account Number: [id number] Date of Birth/Sex: Treating RN: 04-05-1956 (66 y.o. [name] Primary Care Rilynn Habel: [name] Other Clinician: [name] Referring Eyoel Throgmorton: Treating Isais Klipfel/Extender: [name] in Treatment: 10 Visit Information History Since Last Visit All ordered tests and consults were completed: Yes Patient Arrived: Knee Scooter Added or deleted any medications: No Arrival Time: 09:42 Any new allergies or adverse reactions: No Accompanied By: Wife Had a fall or experienced change in No Transfer Assistance: None activities of daily living that may affect Patient Identification Verified: Yes risk of falls: Secondary Verification Process Completed: Yes Signs or symptoms of abuse/neglect since last visito No Patient Requires Transmission-Based Precautions: No Hospitalized since last visit: No Patient Has Alerts: Yes Implantable device outside of the clinic excluding No Patient Alerts: Patient on Blood Thinner cellular tissue based products placed in the center since last visit: Pain Present Now: No Electronic Signature(s) Signed: 11/04/2021 2:06:07 PM By: [name] EMT Entered By: [name] on 11/04/2021 14:06:07 -------------------------------------------------------------------------------- Encounter Discharge Information Details Patient Name: Date of Service: NA [name] 11/04/2021 10:00 A M Medical Record Number: [id number] Patient Account Number: [id number] Date of Birth/Sex: Treating RN: 05/09/1956 (66 y.o. [name] Primary Care Johnatan Baskette: [name] Other Clinician: [name] Referring Nashika Coker: Treating Kavi Almquist/Extender: [name]: 10 Encounter  Discharge Information Items Discharge Condition: Stable Ambulatory Status: Knee Scooter Discharge Destination: Home Transportation: Private Auto Accompanied By: Wife Schedule Follow-up Appointment: Yes Clinical Summary of Care: Electronic Signature(s) Signed: 11/04/2021 2:13:22 PM By: [name] EMT Entered By: [name] on 11/04/2021 14:13:22 -------------------------------------------------------------------------------- Vitals Details Patient Name: Date of Service: NA [name] 11/04/2021 10:00 A M Medical Record Number: [id number] Patient Account Number: [id number] Date of Birth/Sex: Treating RN: 1955/12/01 (66 y.o. [name] Primary Care Domingue Coltrain: [name] Other Clinician: [name] Referring July Nickson: Treating Curlie Macken/Extender: [name]: 10 Vital Signs Time Taken: 09:56 Temperature (F): 97.8 Height (in): 74 Pulse (bpm): 83 Weight (lbs): 186 Respiratory Rate (breaths/min): 18 Body Mass Index (BMI): 23.9 Blood Pressure (mmHg): 110/73 Capillary Blood Glucose (mg/dl): 696 Reference Range: 80 - 120 mg / dl Electronic Signature(s) Signed: 11/04/2021 2:06:49 PM By: [name] EMT Entered By: [name] on 11/04/2021 14:06:49

## 2021-11-04 NOTE — Progress Notes (Addendum)
[name] ([id number]) Visit Report for 11/04/2021 HBO Details Patient Name: Date of Service: NA [name] 11/04/2021 10:00 A M Medical Record Number: [id number] Patient Account Number: [id number] Date of Birth/Sex: Treating RN: 11/28/1955 (66 y.o. [name] Primary Care [name]: [name] Other Clinician: [name] Referring Malvika Tung: Treating Breck Hollinger/Extender: [name]: 10 HBO Treatment Course Details Treatment Course Number: 1 Ordering [name]: [name] T Treatments Ordered: otal 40 HBO Treatment Start Date: 09/20/2021 HBO Indication: Diabetic Ulcer(s) of the Lower Extremity Standard/Conservative Wound Care tried and failed greater than or equal to 30 days Wound #1 Left, Medial Foot HBO Treatment Details Treatment Number: 33 Patient Type: Outpatient Chamber Type: Monoplace Chamber Serial #: [id number] Treatment Protocol: 2.5 ATA with 90 minutes oxygen, with two 5 minute air breaks Treatment Details Compression Rate Down: 2.0 psi / minute De-Compression Rate Up: 2.0 psi / minute A breaks and breathing ir Compress Tx Pressure periods Decompress Decompress Begins Reached (leave unused spaces Begins Ends blank) Chamber Pressure (ATA 1 2.5 2.5 2.5 2.5 2.5 - - 2.5 1 ) Clock Time (24 hr) 10:02 10:15 10:45 10:50 11:20 11:25 - - 11:55 12:09 Treatment Length: 127 (minutes) Treatment Segments: 4 Vital Signs Capillary Blood Glucose Reference Range: 80 - 120 mg / dl HBO Diabetic Blood Glucose Intervention Range: <131 mg/dl or >347 mg/dl Time Vitals Blood Respiratory Capillary Blood Glucose Pulse Action Type: Pulse: Temperature: Taken: Pressure: Rate: Glucose (mg/dl): Meter #: Oximetry (%) Taken: Pre 09:56 110/73 83 18 97.8 203 Post 12:10 139/87 76 16 97.8 209 Treatment Response Treatment Toleration: Well Treatment Completion Status: Treatment Completed without Adverse Event Additional Procedure  Documentation Tissue Sevierity: Necrosis of bone Physician HBO Attestation: I certify that I supervised this HBO treatment in accordance with Medicare guidelines. A trained emergency response team is readily available per Yes hospital policies and procedures. Continue HBOT as ordered. Yes Electronic Signature(s) Signed: 11/04/2021 2:40:44 PM By: Colin Guess MD FACS Previous Signature: 11/04/2021 2:10:42 PM Version By: [name] EMT Entered By: [name] on 11/04/2021 14:40:44 -------------------------------------------------------------------------------- HBO Safety Checklist Details Patient Name: Date of Service: Colin Fanning A. 11/04/2021 10:00 A M Medical Record Number: [id number] Patient Account Number: [id number] Date of Birth/Sex: Treating RN: 1955-08-23 (66 y.o. [name] Primary Care Ermal Brzozowski: [name] Other Clinician: [name] Referring Armanii Pressnell: Treating Lakita Sahlin/Extender: [name]: 10 HBO Safety Checklist Items Safety Checklist Consent Form Signed Patient voided / foley secured and emptied When did you last eato 0645 Last dose of injectable or oral agent 0645 Ostomy pouch emptied and vented if applicable NA All implantable devices assessed, documented and approved NA Intravenous access site secured and place NA Valuables secured Linens and cotton and cotton/polyester blend (less than 51% polyester) Personal oil-based products / skin lotions / body lotions removed Wigs or hairpieces removed NA Smoking or tobacco materials removed Books / newspapers / magazines / loose paper removed Cologne, aftershave, perfume and deodorant removed Jewelry removed (may wrap wedding band) NA Make-up removed NA Hair care products removed Battery operated devices (external) removed Heating patches and chemical warmers removed Titanium eyewear removed NA Nail polish cured greater than 10  hours NA Casting material cured greater than 10 hours NA Hearing aids removed NA Loose dentures or partials removed NA Prosthetics have been removed NA Patient demonstrates correct use of air break device (if applicable) Patient concerns have been addressed Patient grounding bracelet on and cord  attached to chamber Specifics for Inpatients (complete in addition to above) Medication sheet sent with patient NA Intravenous medications needed or due during therapy sent with patient NA Drainage tubes (e.g. nasogastric tube or chest tube secured and vented) NA Endotracheal or Tracheotomy tube secured NA Cuff deflated of air and inflated with saline NA Airway suctioned NA Notes The safety checklist was done before treatment started. Electronic Signature(s) Signed: 11/04/2021 2:08:53 PM By: [name] EMT Entered By: [name] on 11/04/2021 14:08:53

## 2021-11-04 NOTE — Progress Notes (Signed)
[name], [name] ([id number]) Visit Report for 11/04/2021 Problem List Details Patient Name: Date of Service: NA [name] 11/04/2021 10:00 A M Medical Record Number: [id number] Patient Account Number: [id number] Date of Birth/Sex: Treating RN: 1955-11-27 (66 y.o. 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 11/04/2021 Medical Record Number: [id number] Patient Account Number: [id number] Date of Birth/Sex: Treating RN: Oct 24, 1955 (66 y.o. 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 Facility Procedures CPT4 Code: [id number] Description: G0277-(Facility Use Only) HBOT full body chamber, , ICD-10 Diagnosis Description E11.621 Type 2 diabetes mellitus with foot ulcer 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 M86.672 Other chronic  osteomyelitis, left ankle and foot 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

## 2021-11-05 ENCOUNTER — Encounter (HOSPITAL_BASED_OUTPATIENT_CLINIC_OR_DEPARTMENT_OTHER): Payer: BC Managed Care – PPO | Admitting: General Surgery

## 2021-11-05 DIAGNOSIS — M86672 Other chronic osteomyelitis, left ankle and foot: Secondary | ICD-10-CM | POA: Diagnosis not present

## 2021-11-05 DIAGNOSIS — L97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: Secondary | ICD-10-CM | POA: Diagnosis not present

## 2021-11-05 DIAGNOSIS — Z951 Presence of aortocoronary bypass graft: Secondary | ICD-10-CM | POA: Diagnosis not present

## 2021-11-05 DIAGNOSIS — T8131XA Disruption of external operation (surgical) wound, not elsewhere classified, initial encounter: Secondary | ICD-10-CM | POA: Diagnosis not present

## 2021-11-05 DIAGNOSIS — E11610 Type 2 diabetes mellitus with diabetic neuropathic arthropathy: Secondary | ICD-10-CM | POA: Diagnosis not present

## 2021-11-05 DIAGNOSIS — E11621 Type 2 diabetes mellitus with foot ulcer: Secondary | ICD-10-CM | POA: Diagnosis not present

## 2021-11-05 DIAGNOSIS — E1169 Type 2 diabetes mellitus with other specified complication: Secondary | ICD-10-CM | POA: Diagnosis not present

## 2021-11-05 DIAGNOSIS — I2510 Atherosclerotic heart disease of native coronary artery without angina pectoris: Secondary | ICD-10-CM | POA: Diagnosis not present

## 2021-11-05 LAB — GLUCOSE, CAPILLARY
Glucose-Capillary: 231 mg/dL — ABNORMAL HIGH (ref 70–99)
Glucose-Capillary: 308 mg/dL — ABNORMAL HIGH (ref 70–99)

## 2021-11-05 NOTE — Progress Notes (Signed)
Colin, [name] ([id number]) Visit Report for 11/05/2021 Arrival Information Details Patient Name: Date of Service: NA [name] 11/05/2021 12:45 PM Medical Record Number: [id number] Patient Account Number: [id number] Date of Birth/Sex: Treating RN: 1956-02-10 (66 y.o. [name] Colin Care [name]: [name] [name]: Referring Aithan Farrelly: [name]/Extender: [name] in Treatment: 10 Visit Information History Since Last Visit Added or deleted any medications: No Patient Arrived: Knee Scooter Any new allergies or adverse reactions: No Arrival Time: 12:41 Had a fall or experienced change in No Accompanied By: self activities of daily living that may affect Transfer Assistance: None risk of falls: Patient Identification Verified: Yes Signs or symptoms of abuse/neglect since last visito No Secondary Verification Process Completed: Yes Hospitalized since last visit: No Patient Requires Transmission-Based Precautions: No Implantable device outside of the clinic excluding No Patient Has Alerts: Yes cellular tissue based products placed in the center Patient Alerts: Patient on Blood Thinner since last visit: Has Dressing in Place as Prescribed: Yes Pain Present Now: No Electronic Signature(s) Signed: 11/05/2021 4:34:16 PM By: Adline Peals Entered By: Adline Peals on 11/05/2021 12:48:21 -------------------------------------------------------------------------------- Encounter Discharge Information Details Patient Name: Date of Service: [name] 11/05/2021 12:45 PM Medical Record Number: [id number] Patient Account Number: [id number] Date of Birth/Sex: Treating RN: 1956-04-02 (66 y.o. [name] [name]: [name] [name]: Referring Stephone Gum: Colin Naia Ruff/Extender: [name] Weeks in Treatment: 10 Encounter Discharge Information Items Post  Procedure Vitals Discharge Condition: Stable Temperature (F): 97.6 Ambulatory Status: Knee Scooter Pulse (bpm): 79 Discharge Destination: Home Respiratory Rate (breaths/min): 18 Transportation: Private Auto Blood Pressure (mmHg): 153/87 Accompanied By: self Schedule Follow-up Appointment: Yes Clinical Summary of Care: Patient Declined Electronic Signature(s) Signed: 11/05/2021 4:34:16 PM By: Adline Peals Entered By: Adline Peals on 11/05/2021 13:45:15 -------------------------------------------------------------------------------- Lower Extremity Assessment Details Patient Name: Date of Service: Colin Loveless A. 11/05/2021 12:45 PM Medical Record Number: [id number] Patient Account Number: [id number] Date of Birth/Sex: Treating RN: [birth year] (66 y.o. [name] [name]: [name] [name]: Referring Ayat Drenning: Colin Meng Winter[place]/Extender: [name] Weeks in Treatment: 10 Edema Assessment Assessed: Shirlyn Goltz: No] [Right: No] Edema: [Left: Ye] [Right: s] Calf Left: Right: Point of Measurement: From Medial Instep 37.6 cm Ankle Left: Right: Point of Measurement: From Medial Instep 24 cm Vascular Assessment Pulses: Dorsalis Pedis Palpable: [Left:Yes] Electronic Signature(s) Signed: 11/05/2021 4:34:16 PM By: Adline Peals Entered By: Adline Peals on 11/05/2021 12:50:59 -------------------------------------------------------------------------------- Multi Wound Chart Details Patient Name: Date of Service: Colin Loveless A. 11/05/2021 12:45 PM Medical Record Number: [id number] Patient Account Number: [id number] Date of Birth/Sex: Treating RN: [birth year] (66 y.o. [name] [name]: [name] [name]: Referring Rilya Longo: Colin Ardith Test/Extender: [name] Weeks in Treatment: 10 Vital Signs Height(in): 74 Capillary Blood Glucose(mg/dl):  308 Weight(lbs): 186 Pulse(bpm): 36 Body Mass Index(BMI): 23.9 Blood Pressure(mmHg): 153/87 Temperature(F): 97.6 Respiratory Rate(breaths/min): 18 Photos: [1:Left, Medial Foot] [N/A:N/A N/A] Wound Location: [1:Gradually Appeared] [N/A:N/A] Wounding Event: [1:Diabetic Wound/Ulcer of the Lower] [N/A:N/A] Colin Etiology: [1:Extremity Cataracts, Coronary Artery Disease, N/A] Comorbid History: [1:Hypertension, Myocardial Infarction, Peripheral Arterial Disease, Type II Diabetes, Osteomyelitis, Neuropathy 02/22/2021] [N/A:N/A] Date Acquired: [1:10] [N/A:N/A] Weeks of Treatment: [1:Open] [N/A:N/A] Wound Status: [1:No] [N/A:N/A] Wound Recurrence: [1:7x3.7x0.3] [N/A:N/A] Measurements L x W x D (cm) [1:20.342] [N/A:N/A] A (cm) : rea [1:6.103] [N/A:N/A] Volume (cm) : [1:-43.90%] [N/A:N/A] % Reduction in A [1:rea: 56.80%] [N/A:N/A] %  Reduction in Volume: [1:10] Position 1 (o'clock): [1:1.8] Maximum Distance 1 (cm): [1:12] Position 2 (o'clock): [1:3.1] Maximum Distance 2 (cm): [1:Yes] [N/A:N/A] Tunneling: [1:Grade 3] [N/A:N/A] Classification: [1:Medium] [N/A:N/A] Exudate A mount: [1:Serosanguineous] [N/A:N/A] Exudate Type: [1:red, brown] [N/A:N/A] Exudate Color: [1:Distinct, outline attached] [N/A:N/A] Wound Margin: [1:Large (67-100%)] [N/A:N/A] Granulation A mount: [1:Red, Pink, Hyper-granulation] [N/A:N/A] Granulation Quality: [1:Small (1-33%)] [N/A:N/A] Necrotic A mount: [1:Fat Layer (Subcutaneous Tissue): Yes N/A] Exposed Structures: [1:Bone: Yes Fascia: No Tendon: No Muscle: No Joint: No Small (1-33%)] [N/A:N/A] Epithelialization: [1:Debridement - Excisional] [N/A:N/A] Debridement: Pre-procedure Verification/Time Out 13:02 [N/A:N/A] Taken: [1:Colin] [N/A:N/A] Pain Control: [1:Subcutaneous, Slough] [N/A:N/A] Tissue Debrided: [1:Skin/Subcutaneous Tissue] [N/A:N/A] Level: [1:25.9] [N/A:N/A] Debridement A (sq cm): [1:rea Curette] [N/A:N/A] Instrument: [1:Minimum]  [N/A:N/A] Bleeding: [1:Pressure] [N/A:N/A] Hemostasis A chieved: [1:0] [N/A:N/A] Procedural Pain: [1:0] [N/A:N/A] Post Procedural Pain: [1:Procedure was tolerated well] [N/A:N/A] Debridement Treatment Response: [1:7x3.7x0.3] [N/A:N/A] Post Debridement Measurements L x W x D (cm) [1:6.103] [N/A:N/A] Post Debridement Volume: (cm) [1:Chemical Cauterization] [N/A:N/A] Procedures Performed: [1:Debridement] Treatment Notes Electronic Signature(s) Signed: 11/05/2021 1:27:10 PM By: [name] MD FACS Signed: 11/05/2021 4:34:16 PM By: Adline Peals Entered By: [name] on 11/05/2021 13:27:10 -------------------------------------------------------------------------------- Multi-Disciplinary Care Plan Details Patient Name: Date of Service: Colin Loveless A. 11/05/2021 12:45 PM Medical Record Number: [id number] Patient Account Number: [id number] Date of Birth/Sex: Treating RN: December 07, 1955 (66 y.o. [name] Colin Care Kryslyn Helbig: [name] [name]: Referring Ilianna Bown: Colin Christina Gintz/Extender: [name] in Treatment: 10 Multidisciplinary Care Plan reviewed with physician Active Inactive HBO Nursing Diagnoses: Anxiety related to feelings of confinement associated with the hyperbaric oxygen chamber Anxiety related to knowledge deficit of hyperbaric oxygen therapy and treatment procedures Discomfort related to temperature and humidity changes inside hyperbaric chamber Potential for barotraumas to ears, sinuses, teeth, and lungs or cerebral gas embolism related to changes in atmospheric pressure inside hyperbaric oxygen chamber Potential for oxygen toxicity seizures related to delivery of 100% oxygen at an increased atmospheric pressure Potential for pulmonary oxygen toxicity related to delivery of 100% oxygen at an increased atmospheric pressure Goals: Barotrauma will be prevented during HBO2 Date Initiated: 09/17/2021 T arget  Resolution Date: 11/12/2021 Goal Status: Active Patient will tolerate the hyperbaric oxygen therapy treatment Date Initiated: 09/17/2021 T arget Resolution Date: 11/12/2021 Goal Status: Active Patient will tolerate the internal climate of the chamber Date Initiated: 09/17/2021 T arget Resolution Date: 11/12/2021 Goal Status: Active Patient/caregiver will verbalize understanding of HBO goals, rationale, procedures and potential hazards Date Initiated: 09/17/2021 T arget Resolution Date: 11/12/2021 Goal Status: Active Signs and symptoms of pulmonary oxygen toxicity will be recognized and promptly addressed Date Initiated: 09/17/2021 T arget Resolution Date: 11/12/2021 Goal Status: Active Signs and symptoms of seizure will be recognized and promptly addressed ; seizing patients will suffer no harm Date Initiated: 09/17/2021 T arget Resolution Date: 11/12/2021 Goal Status: Active Interventions: Administer decongestants, per physician orders, prior to HBO2 Administer the correct therapeutic gas delivery based on the patients needs and limitations, per physician order Assess and provide for patients comfort related to the hyperbaric environment and equalization of middle ear Assess for signs and symptoms related to adverse events, including but not limited to confinement anxiety, pneumothorax, oxygen toxicity and baurotrauma Assess patient for any history of confinement anxiety Assess patient's knowledge and expectations regarding hyperbaric medicine and provide education related to the hyperbaric environment, goals of treatment and prevention of adverse events Implement protocols to decrease risk of pneumothorax in high risk patients Notes: Nutrition Nursing Diagnoses: Impaired glucose  control: actual or potential Goals: Patient/caregiver verbalizes understanding of need to maintain therapeutic glucose control per Colin care physician Date Initiated: 08/25/2021 Target Resolution Date: 11/12/2021 Goal  Status: Active Interventions: Assess HgA1c results as ordered upon admission and as needed Provide education on elevated blood sugars and impact on wound healing Notes: Osteomyelitis Nursing Diagnoses: Infection: osteomyelitis Knowledge deficit related to disease process and management Goals: Patient's osteomyelitis will resolve Date Initiated: 09/17/2021 Target Resolution Date: 11/12/2021 Goal Status: Active Interventions: Assess for signs and symptoms of osteomyelitis resolution every visit Provide education on osteomyelitis Treatment Activities: Surgical debridement : 09/17/2021 Systemic antibiotics : 09/17/2021 T ordered outside of clinic : 09/17/2021 est Notes: Wound/Skin Impairment Nursing Diagnoses: Impaired tissue integrity Goals: Patient/caregiver will verbalize understanding of skin care regimen Date Initiated: 08/25/2021 Target Resolution Date: 11/12/2021 Goal Status: Active Ulcer/skin breakdown will have a volume reduction of 30% by week 4 Date Initiated: 08/25/2021 Date Inactivated: 10/08/2021 Target Resolution Date: 10/20/2021 Goal Status: Met Interventions: Assess patient/caregiver ability to obtain necessary supplies Assess patient/caregiver ability to perform ulcer/skin care regimen upon admission and as needed Assess ulceration(s) every visit Provide education on ulcer and skin care Treatment Activities: Topical wound management initiated : 08/25/2021 Notes: Electronic Signature(s) Signed: 11/05/2021 4:34:16 PM By: Adline Peals Entered By: Adline Peals on 11/05/2021 12:55:44 -------------------------------------------------------------------------------- Pain Assessment Details Patient Name: Date of Service: Colin Loveless A. 11/05/2021 12:45 PM Medical Record Number: [id number] Patient Account Number: [id number] Date of Birth/Sex: Treating RN: 12-21-1955 (66 y.o. [name] Colin Care Zamari Vea: [name] [name]: Referring  Yeudiel Mateo: Colin Rodrigo Mcgranahan/Extender: [name] Weeks in Treatment: 10 Active Problems Location of Pain Severity and Description of Pain Patient Has Paino No Site Locations Rate the pain. Current Pain Level: 0 Pain Management and Medication Current Pain Management: Electronic Signature(s) Signed: 11/05/2021 4:34:16 PM By: Adline Peals Entered By: Adline Peals on 11/05/2021 12:48:58 -------------------------------------------------------------------------------- Patient/Caregiver Education Details Patient Name: Date of Service: NA [name] 5/26/2023andnbsp12:45 PM Medical Record Number: [id number] Patient Account Number: [id number] Date of Birth/Gender: Treating RN: 26-Oct-1955 (66 y.o. [name] Colin Care Physician: [name] [name]: Referring Physician: Treating Physician/Extender: [name] in Treatment: 10 Education Assessment Education Provided To: Patient Education Topics Provided Wound/Skin Impairment: Methods: Explain/Verbal Responses: Reinforcements needed, State content correctly Electronic Signature(s) Signed: 11/05/2021 4:34:16 PM By: Adline Peals Entered By: Adline Peals on 11/05/2021 12:55:56 -------------------------------------------------------------------------------- Wound Assessment Details Patient Name: Date of Service: Colin Loveless A. 11/05/2021 12:45 PM Medical Record Number: [id number] Patient Account Number: [id number] Date of Birth/Sex: Treating RN: 1955/08/13 (66 y.o. [name] Colin Care Dell Hurtubise: [name] [name]: Referring Rasheedah Reis: Colin Johneric Mcfadden/Extender: [name] Weeks in Treatment: 10 Wound Status Wound Number: 1 Colin Diabetic Wound/Ulcer of the Lower Extremity Etiology: Wound Location: Left, Medial Foot Wound Open Wounding Event: Gradually Appeared Status: Date Acquired:  02/22/2021 Comorbid Cataracts, Coronary Artery Disease, Hypertension, Myocardial Weeks Of Treatment: 10 History: Infarction, Peripheral Arterial Disease, Type II Diabetes, Clustered Wound: No Osteomyelitis, Neuropathy Photos Wound Measurements Length: (cm) 7 Width: (cm) 3.7 Depth: (cm) 0.3 Area: (cm) 20.342 Volume: (cm) 6.103 % Reduction in Area: -43.9% % Reduction in Volume: 56.8% Epithelialization: Small (1-33%) Tunneling: Yes Location 1 Position (o'clock): 10 Maximum Distance: (cm) 1.8 Location 2 Position (o'clock): 12 Maximum Distance: (cm) 3.1 Wound Description Classification: Grade 3 Wound Margin: Distinct, outline attached Exudate Amount: Medium Exudate Type: Serosanguineous Exudate Color: red, brown Foul Odor After Cleansing:  No Slough/Fibrino Yes Wound Bed Granulation Amount: Large (67-100%) Exposed Structure Granulation Quality: Red, Pink, Hyper-granulation Fascia Exposed: No Necrotic Amount: Small (1-33%) Fat Layer (Subcutaneous Tissue) Exposed: Yes Necrotic Quality: Adherent Slough Tendon Exposed: No Muscle Exposed: No Joint Exposed: No Bone Exposed: Yes Treatment Notes Wound #1 (Foot) Wound Laterality: Left, Medial Cleanser Wound Cleanser Discharge Instruction: Cleanse the wound with wound cleanser prior to applying a clean dressing using gauze sponges, not tissue or cotton balls. Peri-Wound Care Skin Prep Discharge Instruction: Use skin prep as directed Zinc Oxide Ointment 30g tube Discharge Instruction: Apply Zinc Oxide to periwound with each dressing change Topical Colin Dressing Promogran Prisma Matrix, 4.34 (sq in) (silver collagen) Discharge Instruction: Moisten collagen with saline or hydrogel Wound Vac Secondary Dressing Secured With Compression Wrap Compression Stockings Add-Ons Electronic Signature(s) Signed: 11/05/2021 4:34:16 PM By: Adline Peals Entered By: Adline Peals on 11/05/2021  12:58:00 -------------------------------------------------------------------------------- Vitals Details Patient Name: Date of Service: [name] 11/05/2021 12:45 PM Medical Record Number: [id number] Patient Account Number: [id number] Date of Birth/Sex: Treating RN: 07-Dec-1955 (66 y.o. [name] Colin Care [place] Hedman: [name] [name]: Referring Mackenna Kamer: Colin Kacey Dysert/Extender: [name] Weeks in Treatment: 10 Vital Signs Time Taken: 12:09 Temperature (F): 97.6 Height (in): 74 Pulse (bpm): 79 Weight (lbs): 186 Respiratory Rate (breaths/min): 18 Body Mass Index (BMI): 23.9 Blood Pressure (mmHg): 153/87 Capillary Blood Glucose (mg/dl): 308 Reference Range: 80 - 120 mg / dl Electronic Signature(s) Signed: 11/05/2021 4:34:16 PM By: Adline Peals Entered By: Adline Peals on 11/05/2021 12:48:50

## 2021-11-05 NOTE — Progress Notes (Signed)
[name], [name] ([id number]) Visit Report for 11/05/2021 Problem List Details Patient Name: Date of Service: NA [name] A. 11/05/2021 8:00 A M Medical Record Number: [id number] Patient Account Number: [id number] Date of Birth/Sex: Treating RN: 1955/08/18 (66 y.o. 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 11/05/2021 Medical Record Number: [id number] Patient Account Number: [id number] Date of Birth/Sex: Treating RN: [birth year] (66 y.o. 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 Facility Procedures CPT4 Code: [id number] Description: G0277-(Facility Use Only) HBOT full body chamber, 50min , ICD-10 Diagnosis Description E11.621 Type 2 diabetes mellitus with foot ulcer 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 M86.672 Other chronic  osteomyelitis, left ankle and foot 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

## 2021-11-05 NOTE — Progress Notes (Signed)
Katherina MiresARRON, Fabrizzio A. ([id number]) Visit Report for 11/05/2021 Chief Complaint Document Details Patient Name: Date of Service: NA Alfonse SpruceRRO N, [name] 11/05/2021 12:45 PM Medical Record Number: [id number] Patient Account Number: [id number] Date of Birth/Sex: Treating RN: 14-Sep-1955 (66 y.o. Marlan PalauM) Herrington, Taylor Primary Care Provider: Simone CuriaLee, Keung Other Clinician: Referring Provider: Treating Provider/Extender: [name], Bodey Frizell Lee, Keung Weeks in Treatment: 10 Information Obtained from: Patient Chief Complaint Patients presents for treatment of an open diabetic ulcer with exposed bone and osteomyelitis Electronic Signature(s) Signed: 11/05/2021 1:27:29 PM By: Duanne Guessannon, Kishia Shackett MD FACS Entered By: Duanne Guessannon, Asmara Backs on 11/05/2021 13:27:29 -------------------------------------------------------------------------------- Debridement Details Patient Name: Date of Service: NA [name] NephewRO N, [name] 11/05/2021 12:45 PM Medical Record Number: [id number] Patient Account Number: [id number] Date of Birth/Sex: Treating RN: 14-Sep-1955 (66 y.o. Marlan PalauM) Herrington, Taylor Primary Care Provider: Simone CuriaLee, Keung Other Clinician: Referring Provider: Treating Provider/Extender: [name], Ambrose Wile Lee, Keung Weeks in Treatment: 10 Debridement Performed for Assessment: Wound #1 Left,Medial Foot Performed By: Physician Duanne Guessannon, Arsalan Brisbin, MD Debridement Type: Debridement Severity of Tissue Pre Debridement: Fat layer exposed Level of Consciousness (Pre-procedure): Awake and Alert Pre-procedure Verification/Time Out Yes - 13:02 Taken: Start Time: 13:02 Pain Control: Other : benzocaine 20% spray T Area Debrided (L x W): otal 7 (cm) x 3.7 (cm) = 25.9 (cm) Tissue and other material debrided: Viable, Non-Viable, Slough, Subcutaneous, Skin: Epidermis, Slough Level: Skin/Subcutaneous Tissue Debridement Description: Excisional Instrument: Curette Bleeding: Minimum Hemostasis Achieved: Pressure Procedural Pain: 0 Post Procedural Pain:  0 Response to Treatment: Procedure was tolerated well Level of Consciousness (Post- Awake and Alert procedure): Post Debridement Measurements of Total Wound Length: (cm) 7 Width: (cm) 3.7 Depth: (cm) 0.3 Volume: (cm) 6.103 Character of Wound/Ulcer Post Debridement: Improved Severity of Tissue Post Debridement: Fat layer exposed Post Procedure Diagnosis Same as Pre-procedure Electronic Signature(s) Signed: 11/05/2021 1:33:29 PM By: Duanne Guessannon, Azani Brogdon MD FACS Signed: 11/05/2021 4:34:16 PM By: Samuella BruinHerrington, Taylor Entered By: Samuella BruinHerrington, Taylor on 11/05/2021 13:07:31 -------------------------------------------------------------------------------- HPI Details Patient Name: Date of Service: NA [name] NephewRO N, [name] 11/05/2021 12:45 PM Medical Record Number: [id number] Patient Account Number: [id number] Date of Birth/Sex: Treating RN: 14-Sep-1955 (66 y.o. Marlan PalauM) Herrington, Taylor Primary Care Provider: Simone CuriaLee, Keung Other Clinician: Referring Provider: Treating Provider/Extender: [name], Haliegh Khurana Lee, Keung Weeks in Treatment: 10 History of Present Illness HPI Description: ADMISSION 08/25/2021 This is a [age] year old man who initially presented to his primary care provider in September 2022 with pain in his left foot. He was sent for an x-ray and while the x-ray was being performed, the tech pointed out a wound on his foot that the patient was not aware existed. He does have type 2 diabetes with significant neuropathy. His diabetes is suboptimally controlled with his most recent A1c being 8.5. He also has a history of coronary artery disease status post three- vessel CABG. he was initially seen by orthopedics, but they referred him to [facility]. He has undergone at least 7 operations/debridements and several applications of skin substitute under the care of podiatry. He has been in a wound VAC for much of this time. His most recent procedure was July 28, 2021. A portion of the talus  was biopsied and was found to be consistent with osteomyelitis. Culture also returned positive for corynebacterium. He was seen on August 16, 2021 by infectious disease. A PICC line has been placed and he will be receiving a 6-week course of IV daptomycin and cefepime. In October 2022, he underwent lower extremity vascular studies. Results are copied here: Right:  Resting right ankle-brachial index is within normal range. No evidence of significant right lower extremity arterial disease. The right toe-brachial index is abnormal. Left: Resting left ankle-brachial index indicates mild left lower extremity arterial disease. The left toe-brachial index is abnormal. He has not been seen by vascular surgery despite these findings. He presented to clinic today in a cam boot and is using a knee scooter to offload. Wound VAC was in place. Once this was removed, a large ulcer was identified on the left midfoot/ankle. Bone is frankly exposed. There is no malodorous or purulent drainage. There is some granulation tissue over the central portion of the exposed bone. There is a tunnel that extends posteriorly for roughly 10 cm. It has been discussed with him by multiple providers that he is at very high risk of losing his lower leg because of this wound. He is extremely eager to avoid this outcome and is here today to review his options as well as receive ongoing wound care. 09/03/2021: Here for reevaluation of his wound. There does not appear to have been any substantial improvement overall since our last visit. He has been in a wound VAC with white foam overlying the exposed bone. We are working on getting him approved for hyperbaric oxygen therapy. 09/10/2021: We are in the process of getting him cleared to begin hyperbaric oxygen therapy. He still needs to obtain a chest x-ray. Although the wound measurements are roughly the same, I think the overall appearance of the wound is better. The exposed bone has a bit  more granulation tissue covering it. He has not received a vascular surgery appointment to reevaluate his flow to the wound. 09/17/2021: He has been approved for hyperbaric oxygen therapy and completed his chest x-ray, which I reviewed and it appears normal. The tunnels at the 12 and 10:00 positions are smaller. There is more granulation tissue covering the exposed bone and the undermining has decreased. He still has not received a vascular surgery appointment. 09/24/2021: He initiated hyperbaric oxygen therapy this week and is tolerating it well. He has an appointment with vascular surgery coming up on May 16. The granulation tissue is covering more of the exposed bone and both tunnels are a bit smaller. 10/01/2021: He continues to tolerate hyperbaric oxygen therapy. He saw infectious disease and they are planning to pull his PICC line. He has been initiated on oral antibiotics (doxycycline and Augmentin). The wound looks about the same but the tunnels are a little bit smaller. The skin seems to be contracting somewhat around the exposed bone. 10/08/2021: The wound is still about the same size, but the tunnels continue to come in and the skin is contracting around the exposed bone. He continues to have some accumulation of necrotic material in the inferoposterior aspect of the wound as well as accumulation at the 12:00 tunnel area. 10/15/2021: The wound is smaller today. The tunnels continue to come in. There is less necrotic tissue present. He does have some periwound maceration. 10/22/2021: The wound is about the same size. There is a little bit less undermining at the distal portion. The exposed bone is dark and I am not sure if this is staining from silver nitrate or his VAC sponge or if it represents necrosis. The tunnels are shallower but he does have some serous drainage coming from the 10:00 tunnel. He continues to tolerate hyperbaric oxygen therapy well. 10/29/2021: The undermining continues to  improve. The tunnels are about the same. He has good granulation tissue overlying the  majority of the exposed bone. It does appear that perhaps the tubing from his wound VAC has been eroding the skin at the 12 clock position. He continues to accumulate senescent epithelium around the borders of the wound. 11/05/2021: The undermining is almost completely resolved. The tunnels have contracted fairly significantly. No significant slough or debris accumulation. There is still senescent epithelium accumulation around the borders of the wound. He has been tolerating hyperbaric oxygen therapy well. Electronic Signature(s) Signed: 11/05/2021 1:28:28 PM By: [name] MD FACS Entered By: [name] on 11/05/2021 13:28:27 -------------------------------------------------------------------------------- Chemical Cauterization Details Patient Name: Date of Service: NA [name] 11/05/2021 12:45 PM Medical Record Number: [id number] Patient Account Number: [id number] Date of Birth/Sex: Treating RN: [birth year] (66 y.o. [name] Primary Care Provider: [name] Other Clinician: Referring Provider: Treating Provider/Extender: [name]: 10 Procedure Performed for: Wound #1 Left,Medial Foot Performed By: Physician [name], MD Post Procedure Diagnosis Same as Pre-procedure Electronic Signature(s) Signed: 11/05/2021 1:33:29 PM By: [name] MD FACS Signed: 11/05/2021 4:34:16 PM By: Gelene Mink By: Samuella Bruin on 11/05/2021 13:07:44 -------------------------------------------------------------------------------- Physical Exam Details Patient Name: Date of Service: NA [name] 11/05/2021 12:45 PM Medical Record Number: [id number] Patient Account Number: [id number] Date of Birth/Sex: Treating RN: 26-Feb-1956 (66 y.o. [name] Primary Care Provider: [name] Other  Clinician: Referring Provider: Treating Provider/Extender: [name]: 10 Constitutional Slightly hypertensive. . . . No acute distress. Respiratory Normal work of breathing on room air. Notes 11/05/2021: The undermining is almost completely resolved. The tunnels have contracted fairly significantly. No significant slough or debris accumulation. There is still senescent epithelium accumulation around the borders of the wound. Electronic Signature(s) Signed: 11/05/2021 1:29:06 PM By: [name] MD FACS Entered By: [name] on 11/05/2021 13:29:05 -------------------------------------------------------------------------------- Physician Orders Details Patient Name: Date of Service: NA [name] 11/05/2021 12:45 PM Medical Record Number: [id number] Patient Account Number: [id number] Date of Birth/Sex: Treating RN: 09-Feb-1956 (66 y.o. [name] Primary Care Provider: [name] Other Clinician: Referring Provider: Treating Provider/Extender: [name]: 10 Verbal / Phone Orders: No Diagnosis Coding Follow-up Appointments ppointment in 1 week. - Dr. Lady Gary - Room 2 Return A Bathing/ Shower/ Hygiene May shower with protection but do not get wound dressing(s) wet. Negative Presssure Wound Therapy Wound Vac to wound continuously at 162mm/hg pressure Black Foam White Foam - Apply white foam or saline moistened gauze to tunnel at 10:00 and 12:00 Other: - Apply silver collagen on wound bed, under black foam Home Health No change in wound care orders this week; continue Home Health for wound care. May utilize formulary equivalent dressing for wound treatment orders unless otherwise specified. Dressing changes to be completed by Home Health on Monday / Wednesday / Friday except when patient has scheduled visit at [facility]. Other Home Health Orders/Instructions: [name]:  Change vac 3x week Hyperbaric Oxygen Therapy Evaluate for HBO Therapy Indication: - Wagner 3 diabetic ulcer left foot If appropriate for treatment, begin HBOT per protocol: 2.5 ATA for 90 Minutes with 2 Five (5) Minute A Breaks ir Total Number of Treatments: - 11/05/2021 additional 40 treatments. One treatments per day (delivered Monday through Friday unless otherwise specified in Special Instructions below): Finger stick Blood Glucose Pre- and Post- HBOT Treatment. Follow Hyperbaric Oxygen Glycemia Protocol A frin (Oxymetazoline HCL) 0.05% nasal spray - 1 spray in  both nostrils daily as needed prior to HBO treatment for difficulty clearing ears Wound Treatment Wound #1 - Foot Wound Laterality: Left, Medial Cleanser: Wound Cleanser 3 x Per Week/30 Days Discharge Instructions: Cleanse the wound with wound cleanser prior to applying a clean dressing using gauze sponges, not tissue or cotton balls. Peri-Wound Care: Skin Prep 3 x Per Week/30 Days Discharge Instructions: Use skin prep as directed Peri-Wound Care: Zinc Oxide Ointment 30g tube 3 x Per Week/30 Days Discharge Instructions: Apply Zinc Oxide to periwound with each dressing change Prim Dressing: Promogran Prisma Matrix, 4.34 (sq in) (silver collagen) 3 x Per Week/30 Days ary Discharge Instructions: Moisten collagen with saline or hydrogel Prim Dressing: Wound Vac ary 3 x Per Week/30 Days GLYCEMIA INTERVENTIONS PROTOCOL PRE-HBO GLYCEMIA INTERVENTIONS ACTION INTERVENTION Obtain pre-HBO capillary blood glucose (ensure 1 physician order is in chart). A. Notify HBO physician and await physician orders. 2 If result is 70 mg/dl or below: B. If the result meets the hospital definition of a critical result, follow hospital policy. A. Give patient an 8 ounce Glucerna Shake, an 8 ounce Ensure, or 8 ounces of a Glucerna/Ensure equivalent dietary supplement*. B. Wait 30 minutes. If result is 71 mg/dl to 161 mg/dl: C. Retest patients  capillary blood glucose (CBG). D. If result greater than or equal to 110 mg/dl, proceed with HBO. If result less than 110 mg/dl, notify HBO physician and consider holding HBO. If result is 131 mg/dl to 096 mg/dl: A. Proceed with HBO. A. Notify HBO physician and await physician orders. B. It is recommended to hold HBO and do If result is 250 mg/dl or greater: blood/urine ketone testing. C. If the result meets the hospital definition of a critical result, follow hospital policy. POST-HBO GLYCEMIA INTERVENTIONS ACTION INTERVENTION Obtain post HBO capillary blood glucose (ensure 1 physician order is in chart). A. Notify HBO physician and await physician orders. 2 If result is 70 mg/dl or below: B. If the result meets the hospital definition of a critical result, follow hospital policy. A. Give patient an 8 ounce Glucerna Shake, an 8 ounce Ensure, or 8 ounces of a Glucerna/Ensure equivalent dietary supplement*. B. Wait 15 minutes for symptoms of If result is 71 mg/dl to 045 mg/dl: hypoglycemia (i.e. nervousness, anxiety, sweating, chills, clamminess, irritability, confusion, tachycardia or dizziness). C. If patient asymptomatic, discharge patient. If patient symptomatic, repeat capillary blood glucose (CBG) and notify HBO physician. If result is 101 mg/dl to 409 mg/dl: A. Discharge patient. A. Notify HBO physician and await physician orders. B. It is recommended to do blood/urine ketone If result is 250 mg/dl or greater: testing. C. If the result meets the hospital definition of a critical result, follow hospital policy. *Juice or candies are NOT equivalent products. If patient refuses the Glucerna or Ensure, please consult the hospital dietitian for an appropriate substitute. Electronic Signature(s) Signed: 11/05/2021 4:19:40 PM By: [name] MD FACS Signed: 11/05/2021 4:34:16 PM By: Samuella Bruin Previous Signature: 11/05/2021 1:30:56 PM Version By: [name] MD FACS Entered By: Samuella Bruin on 11/05/2021 13:49:13 -------------------------------------------------------------------------------- Problem List Details Patient Name: Date of Service: NA [name] 11/05/2021 12:45 PM Medical Record Number: [id number] Patient Account Number: [id number] Date of Birth/Sex: Treating RN: 12-21-1955 (66 y.o. 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 11/05/2021 12:45 PM Medical Record Number: [id number] Patient Account Number: [id number] Date of Birth/Sex: Treating RN: 06/22/1955 (66 y.o. [name] Primary Care Provider: [name] Other Clinician: Referring Provider: Treating Provider/Extender: [name]: 10 Subjective Chief Complaint Information obtained from Patient Patients presents for treatment of an open diabetic ulcer with exposed bone and osteomyelitis History of Present Illness (HPI) ADMISSION 08/25/2021 This is a 66 year old  man who initially presented to his primary care provider in September 2022 with pain in his left foot. He was sent for an x-ray and while the x-ray was being performed, the tech pointed out a wound on his foot that the patient was not aware existed. He does have type 2 diabetes with significant neuropathy. His diabetes is suboptimally controlled with his most recent A1c being 8.5. He also has a history of coronary artery disease status post three- vessel CABG. he was initially seen by orthopedics, but they referred him to [facility]. He has undergone at least 7 operations/debridements and several applications of skin substitute under the care of podiatry. He has been in a wound VAC for much of this time. His most recent procedure was July 28, 2021. A portion of the talus was biopsied and was found to be consistent with osteomyelitis. Culture also returned positive for corynebacterium. He was seen on August 16, 2021 by infectious disease. A PICC line has been placed and he will be receiving a 6-week course of IV daptomycin and cefepime. In October 2022, he underwent lower extremity vascular studies. Results are copied here: Right: Resting right ankle-brachial index is within normal range. No evidence of significant right lower extremity arterial disease. The right toe-brachial index is abnormal. Left: Resting left ankle-brachial index indicates mild left lower extremity arterial disease. The left toe-brachial index is abnormal. He has not been seen by vascular surgery despite these findings. He presented to clinic today in a cam boot and is using a knee scooter to offload. Wound VAC was in place. Once this was removed, a large ulcer was identified on the left midfoot/ankle. Bone is frankly exposed. There is no malodorous or purulent drainage. There is some granulation tissue over the central portion of the exposed bone. There is a tunnel that extends posteriorly for roughly 10  cm. It has been discussed with him by multiple providers that he is at very high risk of losing his lower leg because of this wound. He is extremely eager to avoid this outcome and is here today to review his options as well as receive ongoing wound care. 09/03/2021: Here for reevaluation of his wound. There does not appear to have been any substantial improvement overall since our last visit. He has been in a wound VAC with white foam overlying the exposed bone. We are working on getting him approved for hyperbaric oxygen therapy. 09/10/2021: We are in the process of getting him cleared to begin hyperbaric oxygen therapy. He still needs to obtain  a chest x-ray. Although the wound measurements are roughly the same, I think the overall appearance of the wound is better. The exposed bone has a bit more granulation tissue covering it. He has not received a vascular surgery appointment to reevaluate his flow to the wound. 09/17/2021: He has been approved for hyperbaric oxygen therapy and completed his chest x-ray, which I reviewed and it appears normal. The tunnels at the 12 and 10:00 positions are smaller. There is more granulation tissue covering the exposed bone and the undermining has decreased. He still has not received a vascular surgery appointment. 09/24/2021: He initiated hyperbaric oxygen therapy this week and is tolerating it well. He has an appointment with vascular surgery coming up on May 16. The granulation tissue is covering more of the exposed bone and both tunnels are a bit smaller. 10/01/2021: He continues to tolerate hyperbaric oxygen therapy. He saw infectious disease and they are planning to pull his PICC line. He has been initiated on oral antibiotics (doxycycline and Augmentin). The wound looks about the same but the tunnels are a little bit smaller. The skin seems to be contracting somewhat around the exposed bone. 10/08/2021: The wound is still about the same size, but the tunnels  continue to come in and the skin is contracting around the exposed bone. He continues to have some accumulation of necrotic material in the inferoposterior aspect of the wound as well as accumulation at the 12:00 tunnel area. 10/15/2021: The wound is smaller today. The tunnels continue to come in. There is less necrotic tissue present. He does have some periwound maceration. 10/22/2021: The wound is about the same size. There is a little bit less undermining at the distal portion. The exposed bone is dark and I am not sure if this is staining from silver nitrate or his VAC sponge or if it represents necrosis. The tunnels are shallower but he does have some serous drainage coming from the 10:00 tunnel. He continues to tolerate hyperbaric oxygen therapy well. 10/29/2021: The undermining continues to improve. The tunnels are about the same. He has good granulation tissue overlying the majority of the exposed bone. It does appear that perhaps the tubing from his wound VAC has been eroding the skin at the 12 clock position. He continues to accumulate senescent epithelium around the borders of the wound. 11/05/2021: The undermining is almost completely resolved. The tunnels have contracted fairly significantly. No significant slough or debris accumulation. There is still senescent epithelium accumulation around the borders of the wound. He has been tolerating hyperbaric oxygen therapy well. Patient History Information obtained from Patient. Family History Cancer - Father, Diabetes - Father,Mother,Paternal Grandparents, Heart Disease - Father, Hypertension - Father, No family history of Hereditary Spherocytosis, Kidney Disease, Lung Disease, Seizures, Stroke, Thyroid Problems, Tuberculosis. Social History Never smoker, Marital Status - Married, Alcohol Use - Rarely, Drug Use - No History, Caffeine Use - Daily. Medical History Eyes Patient has history of Cataracts - Removed 2008 Cardiovascular Patient has  history of Coronary Artery Disease, Hypertension, Myocardial Infarction, Peripheral Arterial Disease Endocrine Patient has history of Type II Diabetes Musculoskeletal Patient has history of Osteomyelitis Neurologic Patient has history of Neuropathy Medical A Surgical History Notes nd Cardiovascular Hypercholesterolemia Abnormal EKG CABG X3 2019 Gastrointestinal GERD Musculoskeletal Diabetic foot ulcer Objective Constitutional Slightly hypertensive. No acute distress. Vitals Time Taken: 12:09 PM, Height: 74 in, Weight: 186 lbs, BMI: 23.9, Temperature: 97.6 F, Pulse: 79 bpm, Respiratory Rate: 18 breaths/min, Blood Pressure: 153/87 mmHg, Capillary Blood Glucose: 308  mg/dl. Respiratory Normal work of breathing on room air. General Notes: 11/05/2021: The undermining is almost completely resolved. The tunnels have contracted fairly significantly. No significant slough or debris accumulation. There is still senescent epithelium accumulation around the borders of the wound. Integumentary (Hair, Skin) Wound #1 status is Open. Original cause of wound was Gradually Appeared. The date acquired was: 02/22/2021. The wound has been in treatment 10 weeks. The wound is located on the Left,Medial Foot. The wound measures 7cm length x 3.7cm width x 0.3cm depth; 20.342cm^2 area and 6.103cm^3 volume. There is bone and Fat Layer (Subcutaneous Tissue) exposed. There is tunneling at 10:00 with a maximum distance of 1.8cm. There is additional tunneling and at 12:00 with a maximum distance of 3.1cm. There is a medium amount of serosanguineous drainage noted. The wound margin is distinct with the outline attached to the wound base. There is large (67-100%) red, pink, hyper - granulation within the wound bed. There is a small (1-33%) amount of necrotic tissue within the wound bed including Adherent Slough. 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 There was a Excisional Skin/Subcutaneous Tissue Debridement with a total area of 25.9 sq cm performed by [name], MD. With the following instrument(s): Curette to remove Viable and Non-Viable tissue/material. Material removed includes Subcutaneous Tissue, Slough, and Skin: Epidermis after achieving pain control using Other (benzocaine 20% spray). No specimens were taken. A time out was conducted at 13:02, prior to the start of the procedure. A Minimum amount of bleeding was controlled with Pressure. The procedure was tolerated well with a pain level of 0 throughout and a pain level of 0 following the procedure. Post Debridement Measurements: 7cm length x 3.7cm width x 0.3cm depth; 6.103cm^3 volume. Character of Wound/Ulcer Post Debridement is improved. Severity of Tissue Post Debridement is: Fat layer exposed. Post procedure Diagnosis Wound #1: Same as Pre-Procedure Pre-procedure diagnosis of Wound #1 is a Diabetic Wound/Ulcer of the Lower Extremity located on the Left,Medial Foot . An Chemical Cauterization procedure was performed by [name], MD. Post procedure Diagnosis Wound #1: Same as Pre-Procedure Plan Hyperbaric Oxygen Therapy: T Number of Treatments: - extend for additional 40 treatments (40 + 40 = 80) otal 11/05/2021: The undermining is almost completely resolved. The tunnels have contracted fairly significantly. No significant slough or debris accumulation. There is still senescent epithelium accumulation around the borders of the wound. I used a  curette to debride the minimal slough that had accrued as well as the periwound senescent tissue. The wound does seem to be responding well to hyperbaric oxygen therapy, but the wound still has a long way to go yet. He continues on antibiotic treatment per infectious disease. I would like to extend his hyperbaric treatment for another 40 sessions (80 total). Electronic Signature(s) Signed: 11/05/2021 1:32:21 PM By: [name] MD FACS Entered By: [name] on 11/05/2021 13:32:20 -------------------------------------------------------------------------------- HxROS Details Patient Name: Date of Service: NA [name] 11/05/2021 12:45 PM Medical Record Number: [id number] Patient Account Number: [id number] Date of Birth/Sex: Treating RN: 1956/02/07 (66 y.o. [name] Primary Care Provider: Nedra Hai,  Marquette Saa Other Clinician: Referring Provider: Treating Provider/Extender: [name] in Treatment: 10 Information Obtained From Patient Eyes Medical History: Positive for: Cataracts - Removed 2008 Cardiovascular Medical History: Positive for: Coronary Artery Disease; Hypertension; Myocardial Infarction; Peripheral Arterial Disease Past Medical History Notes: Hypercholesterolemia Abnormal EKG CABG X3 2019 Gastrointestinal Medical History: Past Medical History Notes: GERD Endocrine Medical History: Positive for: Type II Diabetes Time with diabetes: 24 years Treated with: Insulin, Oral agents Blood sugar tested every day: Yes Tested : Musculoskeletal Medical History: Positive for: Osteomyelitis Past Medical History Notes: Diabetic foot ulcer Neurologic Medical History: Positive for: Neuropathy HBO Extended History Items Eyes: Cataracts Immunizations Pneumococcal Vaccine: Received Pneumococcal Vaccination: Yes Received Pneumococcal Vaccination On or After 60th Birthday: Yes Implantable Devices Yes Family and Social  History Cancer: Yes - Father; Diabetes: Yes - Father,Mother,Paternal Grandparents; Heart Disease: Yes - Father; Hereditary Spherocytosis: No; Hypertension: Yes - Father; Kidney Disease: No; Lung Disease: No; Seizures: No; Stroke: No; Thyroid Problems: No; Tuberculosis: No; Never smoker; Marital Status - Married; Alcohol Use: Rarely; Drug Use: No History; Caffeine Use: Daily; Financial Concerns: No; Food, Clothing or Shelter Needs: No; Support System Lacking: No; Transportation Concerns: No Electronic Signature(s) Signed: 11/05/2021 1:33:29 PM By: [name] MD FACS Signed: 11/05/2021 4:34:16 PM By: Gelene Mink By: [name] on 11/05/2021 13:28:40 -------------------------------------------------------------------------------- SuperBill Details Patient Name: Date of Service: NA [name] 11/05/2021 Medical Record Number: [id number] Patient Account Number: [id number] Date of Birth/Sex: Treating RN: 1955/09/17 (66 y.o. 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 Facility Procedures CPT4 Code: [id number] Description: 11042 - DEB SUBQ TISSUE 20 SQ CM/< ICD-10 Diagnosis Description 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 Modifier: Quantity: 1 CPT4 Code: [id number] Description: 11045 - DEB SUBQ TISS EA ADDL 20CM ICD-10 Diagnosis Description L97.324 Non-pressure chronic ulcer of left ankle with necrosis of  bone Modifier: Quantity: 1 Physician Procedures : CPT4 Code Description Modifier [id number] 99214 - WC PHYS LEVEL 4 - EST PT 25 ICD-10 Diagnosis Description 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 M86.672 Other chronic osteomyelitis, left ankle and foot E11.610 Type 2  diabetes mellitus with diabetic neuropathic arthropathy E11.621 Type 2 diabetes mellitus with foot ulcer Quantity: 1 : [id number] 11042 - WC PHYS SUBQ TISS 20 SQ CM ICD-10 Diagnosis Description 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

## 2021-11-05 NOTE — Progress Notes (Addendum)
MAYER, VONDRAK ([id number]) Visit Report for 11/05/2021 HBO Details Patient Name: Date of Service: NA [name] 11/05/2021 8:00 A M Medical Record Number: [id number] Patient Account Number: [id number] Date of Birth/Sex: Treating RN: Apr 27, 1956 (66 y.o. [name] Primary Care Macyn Shropshire: [name] Other Clinician: [name] Referring Meredith Kilbride: Treating Mykah Shin/Extender: [name]: 10 HBO Treatment Course Details Treatment Course Number: 1 Ordering Ingri Diemer: [name] T Treatments Ordered: otal 40 HBO Treatment Start Date: 09/20/2021 HBO Indication: Diabetic Ulcer(s) of the Lower Extremity Standard/Conservative Wound Care tried and failed greater than or equal to 30 days Wound #1 Left, Medial Foot HBO Treatment Details Treatment Number: 34 Patient Type: Outpatient Chamber Type: Monoplace Chamber Serial #: [id number] Treatment Protocol: 2.5 ATA with 90 minutes oxygen, with two 5 minute air breaks Treatment Details Compression Rate Down: 2.0 psi / minute De-Compression Rate Up: 2.0 psi / minute A breaks and breathing ir Compress Tx Pressure periods Decompress Decompress Begins Reached (leave unused spaces Begins Ends blank) Chamber Pressure (ATA 1 2.5 2.5 2.5 2.5 2.5 - - 2.5 1 ) Clock Time (24 hr) 10:02 10:14 10:44 10:50 11:20 11:25 - - 11:55 12:04 Treatment Length: 122 (minutes) Treatment Segments: 4 Vital Signs Capillary Blood Glucose Reference Range: 80 - 120 mg / dl HBO Diabetic Blood Glucose Intervention Range: <131 mg/dl or >572 mg/dl Time Vitals Blood Respiratory Capillary Blood Glucose Pulse Action Type: Pulse: Temperature: Taken: Pressure: Rate: Glucose (mg/dl): Meter #: Oximetry (%) Taken: Pre 09:59 114/76 85 16 97.7 231 Post 12:09 153/87 79 16 97.6 308 Treatment Response Treatment Toleration: Well Treatment Completion Status: Treatment Completed without Adverse Event Treatment Notes Dr. Lady Gary  informed of blood sugar. Additional Procedure Documentation Tissue Sevierity: Necrosis of bone Physician HBO Attestation: I certify that I supervised this HBO treatment in accordance with Medicare guidelines. A trained emergency response team is readily available per Yes hospital policies and procedures. Continue HBOT as ordered. Yes Electronic Signature(s) Signed: 11/05/2021 4:17:05 PM By: [name] MD FACS Previous Signature: 11/05/2021 1:56:19 PM Version By: [name] EMT Entered By: [name] on 11/05/2021 16:17:04 -------------------------------------------------------------------------------- HBO Safety Checklist Details Patient Name: Date of Service: [name] 11/05/2021 8:00 A M Medical Record Number: [id number] Patient Account Number: [id number] Date of Birth/Sex: Treating RN: [birth year] (66 y.o. [name] Primary Care Darleen Moffitt: [name] Other Clinician: [name] Referring Luby Seamans: Treating Raegan Winders/Extender: [name]: 10 HBO Safety Checklist Items Safety Checklist Consent Form Signed Patient voided / foley secured and emptied When did you last eato 0800 Last dose of injectable or oral agent 0800 Ostomy pouch emptied and vented if applicable NA All implantable devices assessed, documented and approved NA Intravenous access site secured and place NA Valuables secured Linens and cotton and cotton/polyester blend (less than 51% polyester) Personal oil-based products / skin lotions / body lotions removed Wigs or hairpieces removed NA Smoking or tobacco materials removed Books / newspapers / magazines / loose paper removed Cologne, aftershave, perfume and deodorant removed Jewelry removed (may wrap wedding band) NA Make-up removed NA Hair care products removed Battery operated devices (external) removed Heating patches and chemical warmers removed Titanium eyewear  removed NA Nail polish cured greater than 10 hours NA Casting material cured greater than 10 hours NA Hearing aids removed NA Loose dentures or partials removed NA Prosthetics have been removed NA Patient demonstrates correct use of air break device (if applicable) Patient concerns have  been addressed Patient grounding bracelet on and cord attached to chamber Specifics for Inpatients (complete in addition to above) Medication sheet sent with patient NA Intravenous medications needed or due during therapy sent with patient NA Drainage tubes (e.g. nasogastric tube or chest tube secured and vented) NA Endotracheal or Tracheotomy tube secured NA Cuff deflated of air and inflated with saline NA Airway suctioned NA Electronic Signature(s) Signed: 11/05/2021 1:40:27 PM By: [name] EMT Entered By: [name] on 11/05/2021 13:40:27

## 2021-11-05 NOTE — Progress Notes (Signed)
[name], [name] ([id number]) Visit Report for 11/05/2021 Arrival Information Details Patient Name: Date of Service: NA [name] 11/05/2021 8:00 [name] Medical Record Number: [id number] Patient Account Number: [id number] Date of Birth/Sex: Treating RN: Jun 13, 1956 (66 y.o. [name] Primary Care Alim Cattell: [name] Other Clinician: [name] Referring Jentzen Minasyan: Treating Peta Peachey/Extender: [name] in Treatment: 10 Visit Information History Since Last Visit All ordered tests and consults were completed: Yes Patient Arrived: Knee Scooter Added or deleted any medications: No Arrival Time: 09:42 Any new allergies or adverse reactions: No Accompanied By: son Had [name] fall or experienced change in No Transfer Assistance: None activities of daily living that may affect Patient Identification Verified: Yes risk of falls: Secondary Verification Process Completed: Yes Signs or symptoms of abuse/neglect since last visito No Patient Requires Transmission-Based Precautions: No Hospitalized since last visit: No Patient Has Alerts: Yes Implantable device outside of the clinic excluding No Patient Alerts: Patient on Blood Thinner cellular tissue based products placed in the center since last visit: Pain Present Now: No Electronic Signature(s) Signed: 11/05/2021 1:38:25 PM By: [name] EMT Entered By: [name] on 11/05/2021 13:38:25 -------------------------------------------------------------------------------- Vitals Details Patient Name: Date of Service: NA [name]. 11/05/2021 8:00 [name] Medical Record Number: [id number] Patient Account Number: [id number] Date of Birth/Sex: Treating RN: 1956-04-22 (66 y.o. [name] Primary Care Brenon Antosh: [name] Other Clinician: [name] Referring Janise Gora: Treating Brenya Taulbee/Extender: [name] Weeks in Treatment: 10 Vital Signs Time Taken: 09:59 Temperature  (F): 97.7 Height (in): 74 Pulse (bpm): 85 Weight (lbs): 186 Respiratory Rate (breaths/min): 16 Body Mass Index (BMI): 23.9 Blood Pressure (mmHg): 114/76 Capillary Blood Glucose (mg/dl): 231 Reference Range: 80 - 120 mg / dl Electronic Signature(s) Signed: 11/05/2021 1:39:14 PM By: [name] EMT Entered By: [name] on 11/05/2021 13:39:14

## 2021-11-06 DIAGNOSIS — T8131XA Disruption of external operation (surgical) wound, not elsewhere classified, initial encounter: Secondary | ICD-10-CM | POA: Diagnosis not present

## 2021-11-07 DIAGNOSIS — T8131XA Disruption of external operation (surgical) wound, not elsewhere classified, initial encounter: Secondary | ICD-10-CM | POA: Diagnosis not present

## 2021-11-08 DIAGNOSIS — L97522 Non-pressure chronic ulcer of other part of left foot with fat layer exposed: Secondary | ICD-10-CM | POA: Diagnosis not present

## 2021-11-08 DIAGNOSIS — I2510 Atherosclerotic heart disease of native coronary artery without angina pectoris: Secondary | ICD-10-CM | POA: Diagnosis not present

## 2021-11-08 DIAGNOSIS — Z452 Encounter for adjustment and management of vascular access device: Secondary | ICD-10-CM | POA: Diagnosis not present

## 2021-11-08 DIAGNOSIS — E785 Hyperlipidemia, unspecified: Secondary | ICD-10-CM | POA: Diagnosis not present

## 2021-11-08 DIAGNOSIS — E1140 Type 2 diabetes mellitus with diabetic neuropathy, unspecified: Secondary | ICD-10-CM | POA: Diagnosis not present

## 2021-11-08 DIAGNOSIS — D649 Anemia, unspecified: Secondary | ICD-10-CM | POA: Diagnosis not present

## 2021-11-08 DIAGNOSIS — Z7982 Long term (current) use of aspirin: Secondary | ICD-10-CM | POA: Diagnosis not present

## 2021-11-08 DIAGNOSIS — I252 Old myocardial infarction: Secondary | ICD-10-CM | POA: Diagnosis not present

## 2021-11-08 DIAGNOSIS — I10 Essential (primary) hypertension: Secondary | ICD-10-CM | POA: Diagnosis not present

## 2021-11-08 DIAGNOSIS — T8131XA Disruption of external operation (surgical) wound, not elsewhere classified, initial encounter: Secondary | ICD-10-CM | POA: Diagnosis not present

## 2021-11-08 DIAGNOSIS — K219 Gastro-esophageal reflux disease without esophagitis: Secondary | ICD-10-CM | POA: Diagnosis not present

## 2021-11-08 DIAGNOSIS — E11610 Type 2 diabetes mellitus with diabetic neuropathic arthropathy: Secondary | ICD-10-CM | POA: Diagnosis not present

## 2021-11-08 DIAGNOSIS — Z794 Long term (current) use of insulin: Secondary | ICD-10-CM | POA: Diagnosis not present

## 2021-11-08 DIAGNOSIS — Z792 Long term (current) use of antibiotics: Secondary | ICD-10-CM | POA: Diagnosis not present

## 2021-11-08 DIAGNOSIS — M1990 Unspecified osteoarthritis, unspecified site: Secondary | ICD-10-CM | POA: Diagnosis not present

## 2021-11-08 DIAGNOSIS — E11621 Type 2 diabetes mellitus with foot ulcer: Secondary | ICD-10-CM | POA: Diagnosis not present

## 2021-11-08 DIAGNOSIS — Z7984 Long term (current) use of oral hypoglycemic drugs: Secondary | ICD-10-CM | POA: Diagnosis not present

## 2021-11-09 ENCOUNTER — Encounter (HOSPITAL_BASED_OUTPATIENT_CLINIC_OR_DEPARTMENT_OTHER): Payer: BC Managed Care – PPO | Admitting: General Surgery

## 2021-11-09 DIAGNOSIS — E11610 Type 2 diabetes mellitus with diabetic neuropathic arthropathy: Secondary | ICD-10-CM | POA: Diagnosis not present

## 2021-11-09 DIAGNOSIS — Z951 Presence of aortocoronary bypass graft: Secondary | ICD-10-CM | POA: Diagnosis not present

## 2021-11-09 DIAGNOSIS — M86672 Other chronic osteomyelitis, left ankle and foot: Secondary | ICD-10-CM | POA: Diagnosis not present

## 2021-11-09 DIAGNOSIS — E1169 Type 2 diabetes mellitus with other specified complication: Secondary | ICD-10-CM | POA: Diagnosis not present

## 2021-11-09 DIAGNOSIS — L97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: Secondary | ICD-10-CM | POA: Diagnosis not present

## 2021-11-09 DIAGNOSIS — E11621 Type 2 diabetes mellitus with foot ulcer: Secondary | ICD-10-CM | POA: Diagnosis not present

## 2021-11-09 DIAGNOSIS — I2510 Atherosclerotic heart disease of native coronary artery without angina pectoris: Secondary | ICD-10-CM | POA: Diagnosis not present

## 2021-11-09 DIAGNOSIS — T8131XA Disruption of external operation (surgical) wound, not elsewhere classified, initial encounter: Secondary | ICD-10-CM | POA: Diagnosis not present

## 2021-11-09 LAB — GLUCOSE, CAPILLARY
Glucose-Capillary: 239 mg/dL — ABNORMAL HIGH (ref 70–99)
Glucose-Capillary: 218 mg/dL — ABNORMAL HIGH (ref 70–99)

## 2021-11-09 NOTE — Progress Notes (Signed)
[name], [name] ([id number]) Visit Report for 11/09/2021 Arrival Information Details Patient Name: Date of Service: NA [name] 11/09/2021 10:00 A M Medical Record Number: [id number] Patient Account Number: [id number] Date of Birth/Sex: Treating RN: 1955-08-24 (66 y.o. [name], [name] Primary Care Render Marley: [name] Other Clinician: [name] Referring Montray Kliebert: Treating Chan Rosasco/Extender: [name] in Treatment: 10 Visit Information History Since Last Visit All ordered tests and consults were completed: Yes Patient Arrived: Knee Scooter Added or deleted any medications: No Arrival Time: 09:30 Any new allergies or adverse reactions: No Accompanied By: Wife Had a fall or experienced change in No Transfer Assistance: None activities of daily living that may affect Patient Requires Transmission-Based Precautions: No risk of falls: Patient Has Alerts: Yes Signs or symptoms of abuse/neglect since last visito No Patient Alerts: Patient on Blood Thinner Hospitalized since last visit: No Implantable device outside of the clinic excluding No cellular tissue based products placed in the center since last visit: Pain Present Now: No Electronic Signature(s) Signed: 11/09/2021 3:21:18 PM By: [name] EMT Entered By: [name] on 11/09/2021 15:21:17 -------------------------------------------------------------------------------- Encounter Discharge Information Details Patient Name: Date of Service: NA Colin Nephew A. 11/09/2021 10:00 A M Medical Record Number: [id number] Patient Account Number: [id number] Date of Birth/Sex: Treating RN: December 09, 1955 (66 y.o. [name] Primary Care Lanita Stammen: [name] Other Clinician: [name] Referring Sophiah Rolin: Treating Jerrell Hart/Extender: [name]: 10 Encounter Discharge Information Items Discharge Condition: Stable Ambulatory Status: Knee  Scooter Discharge Destination: Home Transportation: Private Auto Accompanied By: None Schedule Follow-up Appointment: Yes Clinical Summary of Care: Electronic Signature(s) Signed: 11/09/2021 3:27:51 PM By: [name] EMT Entered By: [name] on 11/09/2021 15:27:50 -------------------------------------------------------------------------------- Vitals Details Patient Name: Date of Service: NA [name] 11/09/2021 10:00 A M Medical Record Number: [id number] Patient Account Number: [id number] Date of Birth/Sex: Treating RN: 11-29-1955 (67 y.o. [name] Primary Care Faylynn Stamos: [name] Other Clinician: [name] Referring [name]: Treating Oriyah Lamphear/Extender: [name]: 10 Vital Signs Time Taken: 09:47 Temperature (F): 98.4 Height (in): 74 Pulse (bpm): 79 Weight (lbs): 186 Respiratory Rate (breaths/min): 16 Body Mass Index (BMI): 23.9 Blood Pressure (mmHg): 117/89 Capillary Blood Glucose (mg/dl): 569 Reference Range: 80 - 120 mg / dl Electronic Signature(s) Signed: 11/09/2021 3:21:58 PM By: [name] EMT Entered By: [name] on 11/09/2021 15:21:58

## 2021-11-09 NOTE — Progress Notes (Signed)
BETH, SPACKMAN ([id number]) Visit Report for 11/09/2021 HBO Safety Checklist Details Patient Name: Date of Service: NA [name] 11/09/2021 10:00 A M Medical Record Number: [id number] Patient Account Number: [id number] Date of Birth/Sex: Treating RN: 10-19-1955 (66 y.o. [name] Primary Care Valeta Paz: [name] Other Clinician: [name] Referring Alphonsa Brickle: Treating Dashiell Franchino/Extender: [name]: 10 HBO Safety Checklist Items Safety Checklist Consent Form Signed Patient voided / foley secured and emptied When did you last eato 0650 Last dose of injectable or oral agent 0650 Ostomy pouch emptied and vented if applicable NA All implantable devices assessed, documented and approved NA Intravenous access site secured and place NA Valuables secured Linens and cotton and cotton/polyester blend (less than 51% polyester) Personal oil-based products / skin lotions / body lotions removed Wigs or hairpieces removed NA Smoking or tobacco materials removed Books / newspapers / magazines / loose paper removed Cologne, aftershave, perfume and deodorant removed Jewelry removed (may wrap wedding band) NA Make-up removed NA Hair care products removed Battery operated devices (external) removed Heating patches and chemical warmers removed Titanium eyewear removed NA Nail polish cured greater than 10 hours NA Casting material cured greater than 10 hours NA Hearing aids removed NA Loose dentures or partials removed NA Prosthetics have been removed NA Patient demonstrates correct use of air break device (if applicable) Patient concerns have been addressed Patient grounding bracelet on and cord attached to chamber Specifics for Inpatients (complete in addition to above) Medication sheet sent with patient NA Intravenous medications needed or due during therapy sent with patient NA Drainage tubes (e.g. nasogastric tube or  chest tube secured and vented) NA Endotracheal or Tracheotomy tube secured NA Cuff deflated of air and inflated with saline NA Airway suctioned NA Notes The safety checklist was done before treatment started. Electronic Signature(s) Signed: 11/09/2021 3:23:44 PM By: [name] EMT Entered By: [name] on 11/09/2021 15:23:44

## 2021-11-09 NOTE — Progress Notes (Signed)
ARIV, PENROD ([id number]) Visit Report for 11/09/2021 Problem List Details Patient Name: Date of Service: NA [name] 11/09/2021 10:00 A M Medical Record Number: [id number] Patient Account Number: [id number] Date of Birth/Sex: Treating RN: 1956/04/15 (66 y.o. 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 11/09/2021 Medical Record Number: [id number] Patient Account Number: [id number] Date of Birth/Sex: Treating RN: [birth year] (66 y.o. 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 Facility Procedures CPT4 Code: 90300923 Description: G0277-(Facility Use Only) HBOT full body chamber, , ICD-10 Diagnosis Description E11.621 Type 2 diabetes mellitus with foot ulcer 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 M86.672 Other chronic  osteomyelitis, left ankle and foot 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 Modifier: Quantity: 4 Physician Procedures : CPT4 Code Description Modifier [id number] - WC PHYS HYPERBARIC OXYGEN THERAPY ICD-10 Diagnosis Description E11.621 Type 2 diabetes mellitus with foot ulcer 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

## 2021-11-10 ENCOUNTER — Encounter (HOSPITAL_BASED_OUTPATIENT_CLINIC_OR_DEPARTMENT_OTHER): Payer: BC Managed Care – PPO | Admitting: General Surgery

## 2021-11-10 DIAGNOSIS — E11621 Type 2 diabetes mellitus with foot ulcer: Secondary | ICD-10-CM | POA: Diagnosis not present

## 2021-11-10 DIAGNOSIS — M1990 Unspecified osteoarthritis, unspecified site: Secondary | ICD-10-CM | POA: Diagnosis not present

## 2021-11-10 DIAGNOSIS — K219 Gastro-esophageal reflux disease without esophagitis: Secondary | ICD-10-CM | POA: Diagnosis not present

## 2021-11-10 DIAGNOSIS — E785 Hyperlipidemia, unspecified: Secondary | ICD-10-CM | POA: Diagnosis not present

## 2021-11-10 DIAGNOSIS — Z951 Presence of aortocoronary bypass graft: Secondary | ICD-10-CM | POA: Diagnosis not present

## 2021-11-10 DIAGNOSIS — Z7984 Long term (current) use of oral hypoglycemic drugs: Secondary | ICD-10-CM | POA: Diagnosis not present

## 2021-11-10 DIAGNOSIS — D649 Anemia, unspecified: Secondary | ICD-10-CM | POA: Diagnosis not present

## 2021-11-10 DIAGNOSIS — L97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: Secondary | ICD-10-CM | POA: Diagnosis not present

## 2021-11-10 DIAGNOSIS — Z794 Long term (current) use of insulin: Secondary | ICD-10-CM | POA: Diagnosis not present

## 2021-11-10 DIAGNOSIS — L97522 Non-pressure chronic ulcer of other part of left foot with fat layer exposed: Secondary | ICD-10-CM | POA: Diagnosis not present

## 2021-11-10 DIAGNOSIS — Z7982 Long term (current) use of aspirin: Secondary | ICD-10-CM | POA: Diagnosis not present

## 2021-11-10 DIAGNOSIS — E1140 Type 2 diabetes mellitus with diabetic neuropathy, unspecified: Secondary | ICD-10-CM | POA: Diagnosis not present

## 2021-11-10 DIAGNOSIS — E1169 Type 2 diabetes mellitus with other specified complication: Secondary | ICD-10-CM | POA: Diagnosis not present

## 2021-11-10 DIAGNOSIS — Z792 Long term (current) use of antibiotics: Secondary | ICD-10-CM | POA: Diagnosis not present

## 2021-11-10 DIAGNOSIS — I10 Essential (primary) hypertension: Secondary | ICD-10-CM | POA: Diagnosis not present

## 2021-11-10 DIAGNOSIS — E11610 Type 2 diabetes mellitus with diabetic neuropathic arthropathy: Secondary | ICD-10-CM | POA: Diagnosis not present

## 2021-11-10 DIAGNOSIS — I2510 Atherosclerotic heart disease of native coronary artery without angina pectoris: Secondary | ICD-10-CM | POA: Diagnosis not present

## 2021-11-10 DIAGNOSIS — T8131XA Disruption of external operation (surgical) wound, not elsewhere classified, initial encounter: Secondary | ICD-10-CM | POA: Diagnosis not present

## 2021-11-10 DIAGNOSIS — I252 Old myocardial infarction: Secondary | ICD-10-CM | POA: Diagnosis not present

## 2021-11-10 DIAGNOSIS — Z452 Encounter for adjustment and management of vascular access device: Secondary | ICD-10-CM | POA: Diagnosis not present

## 2021-11-10 DIAGNOSIS — M86672 Other chronic osteomyelitis, left ankle and foot: Secondary | ICD-10-CM | POA: Diagnosis not present

## 2021-11-10 LAB — GLUCOSE, CAPILLARY
Glucose-Capillary: 198 mg/dL — ABNORMAL HIGH (ref 70–99)
Glucose-Capillary: 166 mg/dL — ABNORMAL HIGH (ref 70–99)

## 2021-11-10 NOTE — Progress Notes (Signed)
BOYD, BUFFALO ([id number]) Visit Report for 11/10/2021 Arrival Information Details Patient Name: Date of Service: NA [name] 11/10/2021 10:00 A M Medical Record Number: [id number] Patient Account Number: [id number] Date of Birth/Sex: Treating RN: 1955/11/01 (66 y.o. [name] Primary Care Takira Sherrin: [name] Other Clinician: [name] Referring Saburo Luger: Treating Khiry Pasquariello/Extender: [name] in Treatment: 11 Visit Information History Since Last Visit All ordered tests and consults were completed: Yes Patient Arrived: Knee Scooter Added or deleted any medications: No Arrival Time: 09:46 Any new allergies or adverse reactions: No Accompanied By: self Had a fall or experienced change in No Transfer Assistance: None activities of daily living that may affect Patient Identification Verified: Yes risk of falls: Secondary Verification Process Completed: Yes Signs or symptoms of abuse/neglect since last visito No Patient Requires Transmission-Based No Hospitalized since last visit: No Precautions: Implantable device outside of the clinic excluding No Patient Has Alerts: Yes cellular tissue based products placed in the center Patient Alerts: Patient on Blood Thinner since last visit: Has Dressing in Place as Prescribed: Yes Has Compression in Place as Prescribed: Yes Has Footwear/Offloading in Place as Prescribed: Yes Left: Multipodus Split/Boot Other:Wound Vac Pain Present Now: No Electronic Signature(s) Signed: 11/10/2021 2:57:52 PM By: [name] CHT EMT BS , , Entered By: [name] on 11/10/2021 14:24:36 -------------------------------------------------------------------------------- Encounter Discharge Information Details Patient Name: Date of Service: Colin Fanning A. 11/10/2021 10:00 A M Medical Record Number: [id number] Patient Account Number: [id number] Date of Birth/Sex: Treating RN: 1955-07-28 (66  y.o. [name] Primary Care Janat Tabbert: [name] Other Clinician: [name] Referring Twania Bujak: Treating Nakeeta Sebastiani/Extender: [name] in Treatment: 11 Encounter Discharge Information Items Discharge Condition: Stable Ambulatory Status: Knee Scooter Discharge Destination: Home Transportation: Private Auto Accompanied By: spouse Schedule Follow-up Appointment: No Clinical Summary of Care: Electronic Signature(s) Signed: 11/10/2021 2:57:52 PM By: [name] CHT EMT BS , , Entered By: [name] on 11/10/2021 14:19:13 -------------------------------------------------------------------------------- Vitals Details Patient Name: Date of Service: NA Colin Nephew A. 11/10/2021 10:00 A M Medical Record Number: [id number] Patient Account Number: [id number] Date of Birth/Sex: Treating RN: Mar 13, 1956 (66 y.o. [name] Primary Care Joniece Smotherman: [name] Other Clinician: [name] Referring Neeta Storey: Treating Marijane Trower/Extender: [name]: 11 Vital Signs Time Taken: 09:54 Temperature (F): 98.2 Height (in): 74 Pulse (bpm): 87 Weight (lbs): 186 Respiratory Rate (breaths/min): 16 Body Mass Index (BMI): 23.9 Blood Pressure (mmHg): 98/67 Capillary Blood Glucose (mg/dl): 027 Reference Range: 80 - 120 mg / dl Electronic Signature(s) Signed: 11/10/2021 2:57:52 PM By: [name] CHT EMT BS , , Entered By: [name] on 11/10/2021 11:00:43

## 2021-11-10 NOTE — Progress Notes (Signed)
[name], [name] ([id number]) Visit Report for 11/10/2021 SuperBill Details Patient Name: Date of Service: NA [name] 11/10/2021 Medical Record Number: [id number] Patient Account Number: [id number] Date of Birth/Sex: Treating RN: June 26, 1955 (66 y.o. 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 Facility Procedures CPT4 Code Description Modifier Quantity [id number] G0277-(Facility Use Only) HBOT full body chamber, , 4 ICD-10 Diagnosis Description E11.621 Type 2 diabetes mellitus with foot ulcer 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 M86.672 Other chronic osteomyelitis, left ankle and foot 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

## 2021-11-10 NOTE — Progress Notes (Signed)
Colin, [name] ([id number]) Visit Report for 11/10/2021 HBO Details Patient Name: Date of Service: NA [name] 11/10/2021 10:00 A M Medical Record Number: [id number] Patient Account Number: [id number] Date of Birth/Sex: Treating RN: 1956-04-29 (66 y.o. [name] Primary Care Joshoa Shawler: [name] Other Clinician: [name] Referring Branch Pacitti: Treating Tawan Corkern/Extender: [name]: 11 HBO Treatment Course Details Treatment Course Number: 1 Ordering Lew Prout: [name] T Treatments Ordered: otal 40 HBO Treatment Start Date: 09/20/2021 HBO Indication: Diabetic Ulcer(s) of the Lower Extremity Standard/Conservative Wound Care tried and failed greater than or equal to 30 days Wound #1 Left, Medial Foot HBO Treatment Details Treatment Number: 36 Patient Type: Outpatient Chamber Type: Monoplace Chamber Serial #: [id number] Treatment Protocol: 2.5 ATA with 90 minutes oxygen, with two 5 minute air breaks Treatment Details Compression Rate Down: 2.0 psi / minute De-Compression Rate Up: 2.0 psi / minute A breaks and breathing ir Compress Tx Pressure periods Decompress Decompress Begins Reached (leave unused spaces Begins Ends blank) Chamber Pressure (ATA 1 2.5 2.5 2.5 2.5 2.5 - - 2.5 1 ) Clock Time (24 hr) 10:19 10:31 11:01 11:06 11:36 11:41 - - 12:11 12:21 Treatment Length: 122 (minutes) Treatment Segments: 4 Vital Signs Capillary Blood Glucose Reference Range: 80 - 120 mg / dl HBO Diabetic Blood Glucose Intervention Range: <131 mg/dl or >283 mg/dl Type: Time Vitals Blood Pulse: Respiratory Temperature: Capillary Blood Glucose Pulse Action Taken: Pressure: Rate: Glucose (mg/dl): Meter #: Oximetry (%) Taken: Pre 09:54 98/67 87 16 98.2 198 2 systolic BP below 100, asymptomatic Post 12:24 141/81 75 16 98.2 166 2 none per protocol Treatment Response Treatment Toleration: Well Treatment Completion Status: Treatment  Completed without Adverse Event Additional Procedure Documentation Tissue Sevierity: Necrosis of bone Physician HBO Attestation: I certify that I supervised this HBO treatment in accordance with Medicare guidelines. A trained emergency response team is readily available per Yes hospital policies and procedures. Continue HBOT as ordered. Yes Electronic Signature(s) Signed: 11/10/2021 2:57:52 PM By: [name] CHT EMT BS , , Signed: 11/10/2021 5:02:34 PM By: [name] MD FACS Previous Signature: 11/10/2021 12:09:40 PM Version By: [name] MD FACS Entered By: [name] on 11/10/2021 14:18:22 -------------------------------------------------------------------------------- HBO Safety Checklist Details Patient Name: Date of Service: [name] A. 11/10/2021 10:00 A M Medical Record Number: [id number] Patient Account Number: [id number] Date of Birth/Sex: Treating RN: Dec 13, 1955 (66 y.o. [name] Primary Care Nya Monds: [name] Other Clinician: [name] Referring Avonte Sensabaugh: Treating Ebonie Westerlund/Extender: [name]: 11 HBO Safety Checklist Items Safety Checklist Consent Form Signed Patient voided / foley secured and emptied When did you last eato 0700 Last dose of injectable or oral agent 0700 Ostomy pouch emptied and vented if applicable NA All implantable devices assessed, documented and approved NA Intravenous access site secured and place NA Valuables secured Linens and cotton and cotton/polyester blend (less than 51% polyester) Personal oil-based products / skin lotions / body lotions removed Wigs or hairpieces removed NA Smoking or tobacco materials removed NA Books / newspapers / magazines / loose paper removed Cologne, aftershave, perfume and deodorant removed Jewelry removed (may wrap wedding band) Make-up removed NA Hair care products removed Battery operated devices (external)  removed Heating patches and chemical warmers removed Titanium eyewear removed NA Nail polish cured greater than 10 hours NA Casting material cured greater than 10 hours NA Hearing aids removed NA Loose dentures or partials removed NA Prosthetics have been  removed NA Patient demonstrates correct use of air break device (if applicable) Patient concerns have been addressed Patient grounding bracelet on and cord attached to chamber Specifics for Inpatients (complete in addition to above) Medication sheet sent with patient NA Intravenous medications needed or due during therapy sent with patient NA Drainage tubes (e.g. nasogastric tube or chest tube secured and vented) NA Endotracheal or Tracheotomy tube secured NA Cuff deflated of air and inflated with saline NA Airway suctioned NA Electronic Signature(s) Signed: 11/10/2021 2:57:52 PM By: [name] CHT EMT BS , , Entered By: [name] on 11/10/2021 11:02:04

## 2021-11-11 ENCOUNTER — Encounter (HOSPITAL_BASED_OUTPATIENT_CLINIC_OR_DEPARTMENT_OTHER): Payer: BC Managed Care – PPO | Attending: General Surgery | Admitting: General Surgery

## 2021-11-11 DIAGNOSIS — L97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: Secondary | ICD-10-CM | POA: Insufficient documentation

## 2021-11-11 DIAGNOSIS — M86672 Other chronic osteomyelitis, left ankle and foot: Secondary | ICD-10-CM | POA: Diagnosis not present

## 2021-11-11 DIAGNOSIS — Z951 Presence of aortocoronary bypass graft: Secondary | ICD-10-CM | POA: Insufficient documentation

## 2021-11-11 DIAGNOSIS — B372 Candidiasis of skin and nail: Secondary | ICD-10-CM | POA: Insufficient documentation

## 2021-11-11 DIAGNOSIS — E11622 Type 2 diabetes mellitus with other skin ulcer: Secondary | ICD-10-CM | POA: Insufficient documentation

## 2021-11-11 DIAGNOSIS — T8131XA Disruption of external operation (surgical) wound, not elsewhere classified, initial encounter: Secondary | ICD-10-CM | POA: Diagnosis not present

## 2021-11-11 DIAGNOSIS — E11610 Type 2 diabetes mellitus with diabetic neuropathic arthropathy: Secondary | ICD-10-CM | POA: Insufficient documentation

## 2021-11-11 DIAGNOSIS — E1169 Type 2 diabetes mellitus with other specified complication: Secondary | ICD-10-CM | POA: Insufficient documentation

## 2021-11-11 DIAGNOSIS — I2510 Atherosclerotic heart disease of native coronary artery without angina pectoris: Secondary | ICD-10-CM | POA: Diagnosis not present

## 2021-11-11 LAB — GLUCOSE, CAPILLARY
Glucose-Capillary: 118 mg/dL — ABNORMAL HIGH (ref 70–99)
Glucose-Capillary: 129 mg/dL — ABNORMAL HIGH (ref 70–99)
Glucose-Capillary: 147 mg/dL — ABNORMAL HIGH (ref 70–99)

## 2021-11-11 NOTE — Progress Notes (Signed)
CARSIN, RANDAZZO ([id number]) Visit Report for 11/11/2021 Problem List Details Patient Name: Date of Service: NA [name] 11/11/2021 10:00 A M Medical Record Number: [id number] Patient Account Number: [id number] Date of Birth/Sex: Treating RN: 1955-11-18 (66 y.o. 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 11/11/2021 Medical Record Number: [id number] Patient Account Number: [id number] Date of Birth/Sex: Treating RN: [birth year] (66 y.o. 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 Facility Procedures CPT4 Code: [id number] Description: G0277-(Facility Use Only) HBOT full body chamber, , ICD-10 Diagnosis Description E11.621 Type 2 diabetes mellitus with foot ulcer 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 M86.672 Other chronic  osteomyelitis, left ankle and foot 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

## 2021-11-12 ENCOUNTER — Encounter (HOSPITAL_BASED_OUTPATIENT_CLINIC_OR_DEPARTMENT_OTHER): Payer: BC Managed Care – PPO | Admitting: General Surgery

## 2021-11-12 DIAGNOSIS — Z951 Presence of aortocoronary bypass graft: Secondary | ICD-10-CM | POA: Diagnosis not present

## 2021-11-12 DIAGNOSIS — B372 Candidiasis of skin and nail: Secondary | ICD-10-CM | POA: Diagnosis not present

## 2021-11-12 DIAGNOSIS — T8131XA Disruption of external operation (surgical) wound, not elsewhere classified, initial encounter: Secondary | ICD-10-CM | POA: Diagnosis not present

## 2021-11-12 DIAGNOSIS — E11610 Type 2 diabetes mellitus with diabetic neuropathic arthropathy: Secondary | ICD-10-CM | POA: Diagnosis not present

## 2021-11-12 DIAGNOSIS — E11621 Type 2 diabetes mellitus with foot ulcer: Secondary | ICD-10-CM | POA: Diagnosis not present

## 2021-11-12 DIAGNOSIS — E1169 Type 2 diabetes mellitus with other specified complication: Secondary | ICD-10-CM | POA: Diagnosis not present

## 2021-11-12 DIAGNOSIS — I2510 Atherosclerotic heart disease of native coronary artery without angina pectoris: Secondary | ICD-10-CM | POA: Diagnosis not present

## 2021-11-12 DIAGNOSIS — L97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: Secondary | ICD-10-CM | POA: Diagnosis not present

## 2021-11-12 DIAGNOSIS — E11622 Type 2 diabetes mellitus with other skin ulcer: Secondary | ICD-10-CM | POA: Diagnosis not present

## 2021-11-12 DIAGNOSIS — M86672 Other chronic osteomyelitis, left ankle and foot: Secondary | ICD-10-CM | POA: Diagnosis not present

## 2021-11-12 LAB — GLUCOSE, CAPILLARY
Glucose-Capillary: 141 mg/dL — ABNORMAL HIGH (ref 70–99)
Glucose-Capillary: 182 mg/dL — ABNORMAL HIGH (ref 70–99)

## 2021-11-12 NOTE — Progress Notes (Signed)
ATREYU, MAK ([id number]) Visit Report for 11/12/2021 Chief Complaint Document Details Patient Name: Date of Service: NA [name] 11/12/2021 12:45 PM Medical Record Number: [id number] Patient Account Number: [id number] Date of Birth/Sex: Treating RN: 1956/05/29 (66 y.o. [name] Primary Care Provider: [name] Other Clinician: Referring Provider: Treating Provider/Extender: [name] in Treatment: 11 Information Obtained from: Patient Chief Complaint Patients presents for treatment of an open diabetic ulcer with exposed bone and osteomyelitis Electronic Signature(s) Signed: 11/12/2021 1:43:29 PM By: [name] MD FACS Entered By: [name] on 11/12/2021 13:43:29 -------------------------------------------------------------------------------- Debridement Details Patient Name: Date of Service: NA Colin Nephew A. 11/12/2021 12:45 PM Medical Record Number: [id number] Patient Account Number: [id number] Date of Birth/Sex: Treating RN: 11-23-1955 (66 y.o. [name] Primary Care Provider: [name] Other Clinician: Referring Provider: Treating Provider/Extender: [name] in Treatment: 11 Debridement Performed for Assessment: Wound #1 Left,Medial Foot Performed By: Physician [name], MD Debridement Type: Debridement Severity of Tissue Pre Debridement: Fat layer exposed Level of Consciousness (Pre-procedure): Awake and Alert Pre-procedure Verification/Time Out Yes - 12:53 Taken: Start Time: 12:53 Pain Control: Other : benzocaine 20% T Area Debrided (L x W): otal 7 (cm) x 3.6 (cm) = 25.2 (cm) Tissue and other material debrided: Viable, Non-Viable, Slough, Subcutaneous, Slough Level: Skin/Subcutaneous Tissue Debridement Description: Excisional Instrument: Curette Bleeding: Minimum Hemostasis Achieved: Pressure Procedural Pain: 0 Response to Treatment: Procedure was tolerated  well Level of Consciousness (Post- Awake and Alert procedure): Post Debridement Measurements of Total Wound Length: (cm) 7 Width: (cm) 3.6 Depth: (cm) 0.3 Volume: (cm) 5.938 Character of Wound/Ulcer Post Debridement: Improved Severity of Tissue Post Debridement: Fat layer exposed Post Procedure Diagnosis Same as Pre-procedure Electronic Signature(s) Signed: 11/12/2021 2:03:29 PM By: [name] MD FACS Signed: 11/12/2021 5:10:34 PM By: Samuella Bruin Entered By: Samuella Bruin on 11/12/2021 12:59:25 -------------------------------------------------------------------------------- HPI Details Patient Name: Date of Service: NA Colin [name] 11/12/2021 12:45 PM Medical Record Number: [id number] Patient Account Number: [id number] Date of Birth/Sex: Treating RN: 1956/02/09 (66 y.o. [name] Primary Care Provider: [name] Other Clinician: Referring Provider: Treating Provider/Extender: [name]: 11 History of Present Illness HPI Description: ADMISSION 08/25/2021 This is a 66 year old man who initially presented to his primary care provider in September 2022 with pain in his left foot. He was sent for an x-ray and while the x-ray was being performed, the tech pointed out a wound on his foot that the patient was not aware existed. He does have type 2 diabetes with significant neuropathy. His diabetes is suboptimally controlled with his most recent A1c being 8.5. He also has a history of coronary artery disease status post three- vessel CABG. he was initially seen by orthopedics, but they referred him to [facility]. He has undergone at least 7 operations/debridements and several applications of skin substitute under the care of podiatry. He has been in a wound VAC for much of this time. His most recent procedure was July 28, 2021. A portion of the talus was biopsied and was found to be consistent with  osteomyelitis. Culture also returned positive for corynebacterium. He was seen on August 16, 2021 by infectious disease. A PICC line has been placed and he will be receiving a 6-week course of IV daptomycin and cefepime. In October 2022, he underwent lower extremity vascular studies. Results are copied here: Right: Resting right ankle-brachial index is within normal  range. No evidence of significant right lower extremity arterial disease. The right toe-brachial index is abnormal. Left: Resting left ankle-brachial index indicates mild left lower extremity arterial disease. The left toe-brachial index is abnormal. He has not been seen by vascular surgery despite these findings. He presented to clinic today in a cam boot and is using a knee scooter to offload. Wound VAC was in place. Once this was removed, a large ulcer was identified on the left midfoot/ankle. Bone is frankly exposed. There is no malodorous or purulent drainage. There is some granulation tissue over the central portion of the exposed bone. There is a tunnel that extends posteriorly for roughly 10 cm. It has been discussed with him by multiple providers that he is at very high risk of losing his lower leg because of this wound. He is extremely eager to avoid this outcome and is here today to review his options as well as receive ongoing wound care. 09/03/2021: Here for reevaluation of his wound. There does not appear to have been any substantial improvement overall since our last visit. He has been in a wound VAC with white foam overlying the exposed bone. We are working on getting him approved for hyperbaric oxygen therapy. 09/10/2021: We are in the process of getting him cleared to begin hyperbaric oxygen therapy. He still needs to obtain a chest x-ray. Although the wound measurements are roughly the same, I think the overall appearance of the wound is better. The exposed bone has a bit more granulation tissue covering it. He has not  received a vascular surgery appointment to reevaluate his flow to the wound. 09/17/2021: He has been approved for hyperbaric oxygen therapy and completed his chest x-ray, which I reviewed and it appears normal. The tunnels at the 12 and 10:00 positions are smaller. There is more granulation tissue covering the exposed bone and the undermining has decreased. He still has not received a vascular surgery appointment. 09/24/2021: He initiated hyperbaric oxygen therapy this week and is tolerating it well. He has an appointment with vascular surgery coming up on May 16. The granulation tissue is covering more of the exposed bone and both tunnels are a bit smaller. 10/01/2021: He continues to tolerate hyperbaric oxygen therapy. He saw infectious disease and they are planning to pull his PICC line. He has been initiated on oral antibiotics (doxycycline and Augmentin). The wound looks about the same but the tunnels are a little bit smaller. The skin seems to be contracting somewhat around the exposed bone. 10/08/2021: The wound is still about the same size, but the tunnels continue to come in and the skin is contracting around the exposed bone. He continues to have some accumulation of necrotic material in the inferoposterior aspect of the wound as well as accumulation at the 12:00 tunnel area. 10/15/2021: The wound is smaller today. The tunnels continue to come in. There is less necrotic tissue present. He does have some periwound maceration. 10/22/2021: The wound is about the same size. There is a little bit less undermining at the distal portion. The exposed bone is dark and I am not sure if this is staining from silver nitrate or his VAC sponge or if it represents necrosis. The tunnels are shallower but he does have some serous drainage coming from the 10:00 tunnel. He continues to tolerate hyperbaric oxygen therapy well. 10/29/2021: The undermining continues to improve. The tunnels are about the same. He has good  granulation tissue overlying the majority of the exposed bone. It does  appear that perhaps the tubing from his wound VAC has been eroding the skin at the 12 clock position. He continues to accumulate senescent epithelium around the borders of the wound. 11/05/2021: The undermining is almost completely resolved. The tunnels have contracted fairly significantly. No significant slough or debris accumulation. There is still senescent epithelium accumulation around the borders of the wound. He has been tolerating hyperbaric oxygen therapy well. 11/12/2021: Despite the measurements of the wound being about the same, the wound has changed in its shape and overall, I think it is improved. The undermining has resolved and the tunnels continue to shorten. There is good granulation tissue encroaching over the small area of bone that has remained exposed at the 12 o'clock position. Minimal slough accumulation. He continues to tolerate hyperbaric oxygen therapy well. Electronic Signature(s) Signed: 11/12/2021 1:44:58 PM By: [name] MD FACS Entered By: [name] on 11/12/2021 13:44:58 -------------------------------------------------------------------------------- Physical Exam Details Patient Name: Date of Service: NA Colin Nephew A. 11/12/2021 12:45 PM Medical Record Number: [id number] Patient Account Number: [id number] Date of Birth/Sex: Treating RN: 1955-08-08 (66 y.o. [name] Primary Care Provider: [name] Other Clinician: Referring Provider: Treating Provider/Extender: [name]: 11 Constitutional Slightly hypertensive. . . . No acute distress. Respiratory Normal work of breathing on room air. Notes 11/12/2021: Despite the measurements of the wound being about the same, the wound has changed in its shape and overall, I think it is improved. The undermining has resolved and the tunnels continue to shorten. There is good granulation  tissue encroaching over the small area of bone that has remained exposed at the 12 o'clock position. Minimal slough accumulation. Electronic Signature(s) Signed: 11/12/2021 1:46:03 PM By: [name] MD FACS Entered By: [name] on 11/12/2021 13:46:03 -------------------------------------------------------------------------------- Physician Orders Details Patient Name: Date of Service: NA Colin Nephew A. 11/12/2021 12:45 PM Medical Record Number: [id number] Patient Account Number: [id number] Date of Birth/Sex: Treating RN: 1955-09-22 (66 y.o. 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 - Dr. Lady Gary - Room 2 Return A Bathing/ Shower/ Hygiene May shower with protection but do not get wound dressing(s) wet. Negative Presssure Wound Therapy Wound Vac to wound continuously at 197mm/hg pressure Black Foam White Foam - Apply white foam or saline moistened gauze to tunnel at 10:00 and 12:00 Other: - Apply silver collagen on wound bed, under black foam Home Health No change in wound care orders this week; continue Home Health for wound care. May utilize formulary equivalent dressing for wound treatment orders unless otherwise specified. Dressing changes to be completed by Home Health on Monday / Wednesday / Friday except when patient has scheduled visit at [facility]. Other Home Health  Orders/Instructions: [name]: Change vac 3x week Hyperbaric Oxygen Therapy Evaluate for HBO Therapy Indication: - Wagner 3 diabetic ulcer left foot If appropriate for treatment, begin HBOT per protocol: 2.5 ATA for 90 Minutes with 2 Five (5) Minute A Breaks ir Total Number of Treatments: - 11/05/2021 additional 40 treatments. One  treatments per day (delivered Monday through Friday unless otherwise specified in Special Instructions below): Finger stick Blood Glucose Pre- and Post- HBOT Treatment. Follow Hyperbaric Oxygen Glycemia Protocol A frin (Oxymetazoline HCL) 0.05% nasal spray - 1 spray in both nostrils daily as needed prior to HBO treatment for difficulty clearing ears Wound Treatment Wound #1 - Foot Wound Laterality: Left, Medial Cleanser: Wound Cleanser 3 x Per Week/30 Days Discharge Instructions: Cleanse the wound with wound cleanser prior to applying a clean dressing using gauze sponges, not tissue or cotton balls. Peri-Wound Care: Skin Prep 3 x Per Week/30 Days Discharge Instructions: Use skin prep as directed Peri-Wound Care: Zinc Oxide Ointment 30g tube 3 x Per Week/30 Days Discharge Instructions: Apply Zinc Oxide to periwound with each dressing change Prim Dressing: Promogran Prisma Matrix, 4.34 (sq in) (silver collagen) 3 x Per Week/30 Days ary Discharge Instructions: Moisten collagen with saline or hydrogel Prim Dressing: Wound Vac ary 3 x Per Week/30 Days Laboratory naerobe culture (MICRO) - PCR of non-healing wound to L medial foot Bacteria identified in Unspecified specimen by A LOINC Code: 635-3 Convenience Name: Anaerobic culture GLYCEMIA INTERVENTIONS PROTOCOL PRE-HBO GLYCEMIA INTERVENTIONS ACTION INTERVENTION Obtain pre-HBO capillary blood glucose (ensure 1 physician order is in chart). A. Notify HBO physician and await physician orders. 2 If result is 70 mg/dl or below: B. If the result meets the hospital definition of a critical result, follow  hospital policy. A. Give patient an 8 ounce Glucerna Shake, an 8 ounce Ensure, or 8 ounces of a Glucerna/Ensure equivalent dietary supplement*. B. Wait 30 minutes. If result is 71 mg/dl to 789 mg/dl: C. Retest patients capillary blood glucose (CBG). D. If result greater than or equal to 110 mg/dl, proceed with HBO. If result less than 110 mg/dl, notify HBO physician and consider holding HBO. If result is 131 mg/dl to 381 mg/dl: A. Proceed with HBO. A. Notify HBO physician and await physician orders. B. It is recommended to hold HBO and do If result is 250 mg/dl or greater: blood/urine ketone testing. C. If the result meets the hospital definition of a critical result, follow hospital policy. POST-HBO GLYCEMIA INTERVENTIONS ACTION INTERVENTION Obtain post HBO capillary blood glucose (ensure 1 physician order is in chart). A. Notify HBO physician and await physician orders. 2 If result is 70 mg/dl or below: B. If the result meets the hospital definition of a critical result, follow hospital policy. A. Give patient an 8 ounce Glucerna Shake, an 8 ounce Ensure, or 8 ounces of a Glucerna/Ensure equivalent dietary supplement*. B. Wait 15 minutes for symptoms of If result is 71 mg/dl to 017 mg/dl: hypoglycemia (i.e. nervousness, anxiety, sweating, chills, clamminess, irritability, confusion, tachycardia or dizziness). C. If patient asymptomatic, discharge patient. If patient symptomatic, repeat capillary blood glucose (CBG) and notify HBO physician. If result is 101 mg/dl to 510 mg/dl: A. Discharge patient. A. Notify HBO physician and await physician orders. B. It is recommended to do blood/urine ketone If result is 250 mg/dl or greater: testing. C. If the result meets the hospital definition of a critical result, follow hospital policy. *Juice or candies are NOT equivalent products. If patient refuses the Glucerna or Ensure, please consult the hospital dietitian  for an appropriate substitute. Electronic Signature(s) Signed: 11/12/2021 2:03:29 PM By: [name] MD FACS Entered By: [name] on 11/12/2021 13:46:40 -------------------------------------------------------------------------------- Problem List Details Patient Name: Date of Service: NA Colin Nephew A. 11/12/2021 12:45 PM Medical Record Number: [id number] Patient Account Number: [id number] Date of Birth/Sex:  Treating RN: Sep 01, 1955 (65 y.o. 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 11/12/2021 12:45 PM Medical Record Number: [id number] Patient Account Number: [id number] Date of Birth/Sex: Treating RN: 1955/12/07 (66 y.o. [name] Primary Care Provider: [name] Other Clinician: Referring Provider: Treating Provider/Extender: [name] in Treatment: 11 Subjective Chief Complaint Information obtained from Patient Patients presents for treatment of an open diabetic ulcer with exposed bone and  osteomyelitis History of Present Illness (HPI) ADMISSION 08/25/2021 This is a 66 year old man who initially presented to his primary care provider in September 2022 with pain in his left foot. He was sent for an x-ray and while the x-ray was being performed, the tech pointed out a wound on his foot that the patient was not aware existed. He does have type 2 diabetes with significant neuropathy. His diabetes is suboptimally controlled with his most recent A1c being 8.5. He also has a history of coronary artery disease status post three- vessel CABG. he was initially seen by orthopedics, but they referred him to [facility]. He has undergone at least 7 operations/debridements and several applications of skin substitute under the care of podiatry. He has been in a wound VAC for much of this time. His most recent procedure was July 28, 2021. A portion of the talus was biopsied and was found to be consistent with osteomyelitis. Culture also returned positive for corynebacterium. He was seen on August 16, 2021 by infectious disease. A PICC line has been placed and he will be receiving a 6-week course of IV daptomycin and cefepime. In October 2022, he underwent lower extremity vascular studies. Results are copied here: Right: Resting right ankle-brachial index is within normal range. No evidence of significant right lower extremity arterial disease. The right toe-brachial index is abnormal. Left: Resting left ankle-brachial index indicates mild left lower extremity arterial disease. The left toe-brachial index is abnormal. He has not been seen by vascular surgery despite these findings. He presented to clinic today in a cam boot and is using a knee scooter to offload. Wound VAC was in place. Once this was removed, a large ulcer was identified on the left midfoot/ankle. Bone is frankly exposed. There is no malodorous or purulent drainage. There is some granulation tissue over the  central portion of the exposed bone. There is a tunnel that extends posteriorly for roughly 10 cm. It has been discussed with him by multiple providers that he is at very high risk of losing his lower leg because of this wound. He is extremely eager to avoid this outcome and is here today to review his options as well as receive ongoing wound care. 09/03/2021: Here for reevaluation of his wound. There does not appear to have been any substantial improvement overall since  our last visit. He has been in a wound VAC with white foam overlying the exposed bone. We are working on getting him approved for hyperbaric oxygen therapy. 09/10/2021: We are in the process of getting him cleared to begin hyperbaric oxygen therapy. He still needs to obtain a chest x-ray. Although the wound measurements are roughly the same, I think the overall appearance of the wound is better. The exposed bone has a bit more granulation tissue covering it. He has not received a vascular surgery appointment to reevaluate his flow to the wound. 09/17/2021: He has been approved for hyperbaric oxygen therapy and completed his chest x-ray, which I reviewed and it appears normal. The tunnels at the 12 and 10:00 positions are smaller. There is more granulation tissue covering the exposed bone and the undermining has decreased. He still has not received a vascular surgery appointment. 09/24/2021: He initiated hyperbaric oxygen therapy this week and is tolerating it well. He has an appointment with vascular surgery coming up on May 16. The granulation tissue is covering more of the exposed bone and both tunnels are a bit smaller. 10/01/2021: He continues to tolerate hyperbaric oxygen therapy. He saw infectious disease and they are planning to pull his PICC line. He has been initiated on oral antibiotics (doxycycline and Augmentin). The wound looks about the same but the tunnels are a little bit smaller. The skin seems to be contracting somewhat  around the exposed bone. 10/08/2021: The wound is still about the same size, but the tunnels continue to come in and the skin is contracting around the exposed bone. He continues to have some accumulation of necrotic material in the inferoposterior aspect of the wound as well as accumulation at the 12:00 tunnel area. 10/15/2021: The wound is smaller today. The tunnels continue to come in. There is less necrotic tissue present. He does have some periwound maceration. 10/22/2021: The wound is about the same size. There is a little bit less undermining at the distal portion. The exposed bone is dark and I am not sure if this is staining from silver nitrate or his VAC sponge or if it represents necrosis. The tunnels are shallower but he does have some serous drainage coming from the 10:00 tunnel. He continues to tolerate hyperbaric oxygen therapy well. 10/29/2021: The undermining continues to improve. The tunnels are about the same. He has good granulation tissue overlying the majority of the exposed bone. It does appear that perhaps the tubing from his wound VAC has been eroding the skin at the 12 clock position. He continues to accumulate senescent epithelium around the borders of the wound. 11/05/2021: The undermining is almost completely resolved. The tunnels have contracted fairly significantly. No significant slough or debris accumulation. There is still senescent epithelium accumulation around the borders of the wound. He has been tolerating hyperbaric oxygen therapy well. 11/12/2021: Despite the measurements of the wound being about the same, the wound has changed in its shape and overall, I think it is improved. The undermining has resolved and the tunnels continue to shorten. There is good granulation tissue encroaching over the small area of bone that has remained exposed at the 12 o'clock position. Minimal slough accumulation. He continues to tolerate hyperbaric oxygen therapy well. Patient  History Information obtained from Patient. Family History Cancer - Father, Diabetes - Father,Mother,Paternal Grandparents, Heart Disease - Father, Hypertension - Father, No family history of Hereditary Spherocytosis, Kidney Disease, Lung Disease, Seizures, Stroke, Thyroid Problems, Tuberculosis. Social History Never smoker, Marital Status - Married,  Alcohol Use - Rarely, Drug Use - No History, Caffeine Use - Daily. Medical History Eyes Patient has history of Cataracts - Removed 2008 Cardiovascular Patient has history of Coronary Artery Disease, Hypertension, Myocardial Infarction, Peripheral Arterial Disease Endocrine Patient has history of Type II Diabetes Musculoskeletal Patient has history of Osteomyelitis Neurologic Patient has history of Neuropathy Medical A Surgical History Notes nd Cardiovascular Hypercholesterolemia Abnormal EKG CABG X3 2019 Gastrointestinal GERD Musculoskeletal Diabetic foot ulcer Objective Constitutional Slightly hypertensive. No acute distress. Vitals Time Taken: 12:39 PM, Height: 74 in, Weight: 186 lbs, BMI: 23.9, Temperature: 97.9 F, Pulse: 91 bpm, Respiratory Rate: 16 breaths/min, Blood Pressure: 144/72 mmHg, Capillary Blood Glucose: 143 mg/dl. Respiratory Normal work of breathing on room air. General Notes: 11/12/2021: Despite the measurements of the wound being about the same, the wound has changed in its shape and overall, I think it is improved. The undermining has resolved and the tunnels continue to shorten. There is good granulation tissue encroaching over the small area of bone that has remained exposed at the 12 o'clock position. Minimal slough accumulation. Integumentary (Hair, Skin) Wound #1 status is Open. Original cause of wound was Gradually Appeared. The date acquired was: 02/22/2021. The wound has been in treatment 11 weeks. The wound is located on the Left,Medial Foot. The wound measures 7cm length x 3.6cm width x 0.3cm depth;  19.792cm^2 area and 5.938cm^3 volume. There is bone and Fat Layer (Subcutaneous Tissue) exposed. There is no undermining noted, however, there is tunneling at 12:00 with a maximum distance of 3.4cm. There is additional tunneling and at 9:00 with a maximum distance of 2.4cm. There is a medium amount of serosanguineous drainage noted. The wound margin is distinct with the outline attached to the wound base. There is large (67-100%) red, pink, hyper - granulation within the wound bed. There is a small (1-33%) amount of necrotic tissue within the wound bed including Adherent Slough. 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 There was a Excisional Skin/Subcutaneous Tissue Debridement with a total area of 25.2 sq cm performed by [name], MD. With the following instrument(s): Curette to remove Viable and Non-Viable tissue/material. Material removed includes Subcutaneous Tissue and Slough and after achieving pain control using Other (benzocaine 20%). No specimens were taken. A time out was conducted at 12:53, prior to the start of the procedure. A Minimum amount of bleeding was controlled with Pressure. The procedure was tolerated well with a pain level of 0 throughout. Post Debridement Measurements: 7cm length x 3.6cm width x 0.3cm depth; 5.938cm^3 volume. Character of Wound/Ulcer Post Debridement is improved. Severity of Tissue Post Debridement is: Fat layer exposed. Post procedure Diagnosis Wound #1: Same as  Pre-Procedure Plan Follow-up Appointments: Return Appointment in 1 week. - Dr. Lady Gary - Room 2 Bathing/ Shower/ Hygiene: May shower with protection but do not get wound dressing(s) wet. Negative Presssure Wound Therapy: Wound Vac to wound continuously at 130mm/hg pressure Black Foam White Foam - Apply white foam or saline moistened gauze to tunnel at 10:00 and 12:00 Other: - Apply silver collagen on wound bed, under black foam Home Health: No  change in wound care orders this week; continue Home Health for wound care. May utilize formulary equivalent dressing for wound treatment orders unless otherwise specified. Dressing changes to be completed by Home Health on Monday / Wednesday / Friday except when patient has scheduled visit at [facility]. Other Home Health Orders/Instructions: [name]: Change vac 3x week Hyperbaric Oxygen Therapy: Evaluate for HBO Therapy Indication: - Wagner 3 diabetic ulcer left foot If appropriate for treatment, begin HBOT per protocol: 2.5 ATA for 90 Minutes with 2 Five (5) Minute Air Breaks T Number of Treatments: - 11/05/2021 additional 40 treatments. otal One treatments per day (delivered Monday through Friday unless otherwise specified in Special Instructions below): Finger stick Blood Glucose Pre- and Post- HBOT Treatment. Follow Hyperbaric Oxygen Glycemia Protocol Afrin (Oxymetazoline HCL) 0.05% nasal spray - 1 spray in both nostrils daily as needed prior to HBO treatment for difficulty clearing ears Laboratory ordered were: Anaerobic culture - PCR of non-healing wound to L medial foot WOUND #1: - Foot Wound Laterality: Left, Medial Cleanser: Wound Cleanser 3 x Per Week/30 Days Discharge Instructions: Cleanse the wound with wound cleanser prior to applying a clean dressing using gauze sponges, not tissue or cotton balls. Peri-Wound Care: Skin Prep 3 x Per Week/30 Days Discharge Instructions: Use skin prep as directed Peri-Wound Care: Zinc  Oxide Ointment 30g tube 3 x Per Week/30 Days Discharge Instructions: Apply Zinc Oxide to periwound with each dressing change Prim Dressing: Promogran Prisma Matrix, 4.34 (sq in) (silver collagen) 3 x Per Week/30 Days ary Discharge Instructions: Moisten collagen with saline or hydrogel Prim Dressing: Wound Vac 3 x Per Week/30 Days ary 11/12/2021: Despite the measurements of the wound being about the same, the wound has changed in its shape and overall, I think it is improved. The undermining has resolved and the tunnels continue to shorten. There is good granulation tissue encroaching over the small area of bone that has remained exposed at the 12 o'clock position. Minimal slough accumulation. I used a curette to debride the slough from the wound. Although he does seem to be making progress, it is not as rapid as I would like given that he is receiving hyperbaric oxygen. I took a PCR culture today to see if there is any untreated infection that needs to be addressed. He has been off antibiotics for many weeks at this point. I will follow-up the culture data and initiate treatment as indicated. For now, we will continue the wound VAC and hyperbaric oxygen. Follow-up in 1 week. Electronic Signature(s) Signed: 11/12/2021 1:47:42 PM By: [name] MD FACS Entered By: [name] on 11/12/2021 13:47:41 -------------------------------------------------------------------------------- HxROS Details Patient Name: Date of Service: NA Colin [name] 11/12/2021 12:45 PM Medical Record Number: [id number] Patient Account Number: [id number] Date of Birth/Sex: Treating RN: Sep 24, 1955 (66 y.o. [name] Primary Care Provider: [name] Other Clinician: Referring Provider: Treating Provider/Extender: [name]: 11 Information Obtained From Patient Eyes Medical History: Positive for: Cataracts - Removed 2008 Cardiovascular Medical  History: Positive for: Coronary Artery Disease; Hypertension; Myocardial Infarction; Peripheral Arterial Disease Past Medical History Notes: Hypercholesterolemia Abnormal EKG CABG X3 2019 Gastrointestinal Medical History: Past Medical History Notes: GERD Endocrine Medical History: Positive for: Type II Diabetes Time with diabetes: 24 years Treated with: Insulin, Oral agents Blood sugar tested every day: Yes Tested : Musculoskeletal Medical History: Positive for: Osteomyelitis Past Medical History Notes: Diabetic foot ulcer Neurologic Medical History: Positive for: Neuropathy HBO Extended History  Items Eyes: Cataracts Immunizations Pneumococcal Vaccine: Received Pneumococcal Vaccination: Yes Received Pneumococcal Vaccination On or After 60th Birthday: Yes Implantable Devices Yes Family and Social History Cancer: Yes - Father; Diabetes: Yes - Father,Mother,Paternal Grandparents; Heart Disease: Yes - Father; Hereditary Spherocytosis: No; Hypertension: Yes - Father; Kidney Disease: No; Lung Disease: No; Seizures: No; Stroke: No; Thyroid Problems: No; Tuberculosis: No; Never smoker; Marital Status - Married; Alcohol Use: Rarely; Drug Use: No History; Caffeine Use: Daily; Financial Concerns: No; Food, Clothing or Shelter Needs: No; Support System Lacking: No; Transportation Concerns: No Electronic Signature(s) Signed: 11/12/2021 2:03:29 PM By: [name] MD FACS Signed: 11/12/2021 5:10:34 PM By: Gelene Mink By: [name] on 11/12/2021 13:45:04 -------------------------------------------------------------------------------- SuperBill Details Patient Name: Date of Service: NA Colin [name] 11/12/2021 Medical Record Number: [id number] Patient Account Number: [id number] Date of Birth/Sex: Treating RN: July 14, 1955 (66 y.o. 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 Facility Procedures CPT4 Code: [id number] Description: 11042 - DEB SUBQ TISSUE 20 SQ CM/< ICD-10 Diagnosis Description 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 Modifier: Quantity: 1 CPT4 Code: [id number] Description: 11045 - DEB SUBQ TISS EA ADDL 20CM ICD-10 Diagnosis Description 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 Modifier: Quantity: 1 Physician Procedures : CPT4 Code Description Modifier [id number] 99213 - WC PHYS LEVEL 3 - EST PT 25 ICD-10 Diagnosis Description 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 M86.672 Other chronic osteomyelitis, left ankle and foot E11.621 Type 2  diabetes mellitus with foot ulcer 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 Quantity: 1 : [id number] 11042 - WC PHYS SUBQ TISS 20 SQ CM ICD-10 Diagnosis Description 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

## 2021-11-12 NOTE — Progress Notes (Signed)
EASTER, SCHINKE ([id number]) Visit Report for 11/12/2021 Arrival Information Details Patient Name: Date of Service: NA [name] 11/12/2021 12:45 PM Medical Record Number: [id number] Patient Account Number: [id number] Date of Birth/Sex: Treating RN: 1955/12/24 (66 y.o. [name] Primary Care Letesha Klecker: [name] Other Clinician: Referring Cicilia Clinger: Treating Kenard Morawski/Extender: [name] in Treatment: 11 Visit Information History Since Last Visit Added or deleted any medications: No Patient Arrived: Knee Scooter Any new allergies or adverse reactions: No Arrival Time: 12:28 Had a fall or experienced change in No Accompanied By: self activities of daily living that may affect Transfer Assistance: None risk of falls: Patient Identification Verified: Yes Signs or symptoms of abuse/neglect since last visito No Secondary Verification Process Completed: Yes Hospitalized since last visit: No Patient Requires Transmission-Based Precautions: No Implantable device outside of the clinic excluding No Patient Has Alerts: Yes cellular tissue based products placed in the center Patient Alerts: Patient on Blood Thinner since last visit: Has Dressing in Place as Prescribed: Yes Pain Present Now: No Electronic Signature(s) Signed: 11/12/2021 5:10:34 PM By: Adline Peals Entered By: Adline Peals on 11/12/2021 12:29:48 -------------------------------------------------------------------------------- Lower Extremity Assessment Details Patient Name: Date of Service: [name] 11/12/2021 12:45 PM Medical Record Number: [id number] Patient Account Number: [id number] Date of Birth/Sex: Treating RN: 06-May-1956 (66 y.o. [name] Primary Care Jeani Fassnacht: [name] Other Clinician: Referring Suzi Hernan: Treating Rossana Molchan/Extender: [name] Weeks in Treatment: 11 Edema Assessment Assessed: Shirlyn Goltz: No] [Right:  No] Edema: [Left: Ye] [Right: s] Calf Left: Right: Point of Measurement: From Medial Instep 39.1 cm Ankle Left: Right: Point of Measurement: From Medial Instep 25.4 cm Vascular Assessment Pulses: Dorsalis Pedis Palpable: [Left:Yes] Electronic Signature(s) Signed: 11/12/2021 5:10:34 PM By: Adline Peals Entered By: Adline Peals on 11/12/2021 12:43:01 -------------------------------------------------------------------------------- Multi Wound Chart Details Patient Name: Date of Service: Colin Loveless A. 11/12/2021 12:45 PM Medical Record Number: [id number] Patient Account Number: [id number] Date of Birth/Sex: Treating RN: 05/02/1956 (66 y.o. [name] Primary Care Martinique Pizzimenti: [name] Other Clinician: Referring Mayford Alberg: Treating Elye Harmsen/Extender: [name] Weeks in Treatment: 11 Vital Signs Height(in): 74 Capillary Blood Glucose(mg/dl): 143 Weight(lbs): 186 Pulse(bpm): 91 Body Mass Index(BMI): 23.9 Blood Pressure(mmHg): 144/72 Temperature(F): 97.9 Respiratory Rate(breaths/min): 16 Photos: [N/A:N/A] Left, Medial Foot N/A N/A Wound Location: Gradually Appeared N/A N/A Wounding Event: Diabetic Wound/Ulcer of the Lower N/A N/A Primary Etiology: Extremity Cataracts, Coronary Artery Disease, N/A N/A Comorbid History: Hypertension, Myocardial Infarction, Peripheral Arterial Disease, Type II Diabetes, Osteomyelitis, Neuropathy 02/22/2021 N/A N/A Date Acquired: 11 N/A N/A Weeks of Treatment: Open N/A N/A Wound Status: No N/A N/A Wound Recurrence: 7x3.6x0.3 N/A N/A Measurements L x W x D (cm) 19.792 N/A N/A A (cm) : rea 5.938 N/A N/A Volume (cm) : -40.00% N/A N/A % Reduction in A rea: 58.00% N/A N/A % Reduction in Volume: 12 Position 1 (o'clock): 3.4 Maximum Distance 1 (cm): 9 Position 2 (o'clock): 2.4 Maximum Distance 2 (cm): Yes N/A N/A Tunneling: Grade 3 N/A N/A Classification: Medium N/A  N/A Exudate A mount: Serosanguineous N/A N/A Exudate Type: red, brown N/A N/A Exudate Color: Distinct, outline attached N/A N/A Wound Margin: Large (67-100%) N/A N/A Granulation A mount: Red, Pink, Hyper-granulation N/A N/A Granulation Quality: Small (1-33%) N/A N/A Necrotic A mount: Fat Layer (Subcutaneous Tissue): Yes N/A N/A Exposed Structures: Bone: Yes Fascia: No Tendon: No Muscle: No Joint: No Small (1-33%) N/A N/A Epithelialization: Debridement - Excisional N/A N/A Debridement: 12:53  N/A N/A Pre-procedure Verification/Time Out Taken: Other N/A N/A Pain Control: Subcutaneous, Slough N/A N/A Tissue Debrided: Skin/Subcutaneous Tissue N/A N/A Level: 25.2 N/A N/A Debridement A (sq cm): rea Curette N/A N/A Instrument: Minimum N/A N/A Bleeding: Pressure N/A N/A Hemostasis A chieved: 0 N/A N/A Procedural Pain: Procedure was tolerated well N/A N/A Debridement Treatment Response: 7x3.6x0.3 N/A N/A Post Debridement Measurements L x W x D (cm) 5.938 N/A N/A Post Debridement Volume: (cm) Debridement N/A N/A Procedures Performed: Treatment Notes Electronic Signature(s) Signed: 11/12/2021 1:43:24 PM By: [name] MD FACS Signed: 11/12/2021 5:10:34 PM By: Adline Peals Entered By: [name] on 11/12/2021 13:43:23 -------------------------------------------------------------------------------- Multi-Disciplinary Care Plan Details Patient Name: Date of Service: [name] 11/12/2021 12:45 PM Medical Record Number: [id number] Patient Account Number: [id number] Date of Birth/Sex: Treating RN: July 13, 1955 (66 y.o. [name] Primary Care Amalio Loe: [name] Other Clinician: Referring Keisean Skowron: Treating Idamay Hosein/Extender: [name] in Treatment: 11 Multidisciplinary Care Plan reviewed with physician Active Inactive HBO Nursing Diagnoses: Anxiety related to feelings of confinement associated with  the hyperbaric oxygen chamber Anxiety related to knowledge deficit of hyperbaric oxygen therapy and treatment procedures Discomfort related to temperature and humidity changes inside hyperbaric chamber Potential for barotraumas to ears, sinuses, teeth, and lungs or cerebral gas embolism related to changes in atmospheric pressure inside hyperbaric oxygen chamber Potential for oxygen toxicity seizures related to delivery of 100% oxygen at an increased atmospheric pressure Potential for pulmonary oxygen toxicity related to delivery of 100% oxygen at an increased atmospheric pressure Goals: Barotrauma will be prevented during HBO2 Date Initiated: 09/17/2021 T arget Resolution Date: 12/10/2021 Goal Status: Active Patient will tolerate the hyperbaric oxygen therapy treatment Date Initiated: 09/17/2021 T arget Resolution Date: 12/10/2021 Goal Status: Active Patient will tolerate the internal climate of the chamber Date Initiated: 09/17/2021 T arget Resolution Date: 12/10/2021 Goal Status: Active Patient/caregiver will verbalize understanding of HBO goals, rationale, procedures and potential hazards Date Initiated: 09/17/2021 T arget Resolution Date: 12/10/2021 Goal Status: Active Signs and symptoms of pulmonary oxygen toxicity will be recognized and promptly addressed Date Initiated: 09/17/2021 T arget Resolution Date: 12/10/2021 Goal Status: Active Signs and symptoms of seizure will be recognized and promptly addressed ; seizing patients will suffer no harm Date Initiated: 09/17/2021 T arget Resolution Date: 12/10/2021 Goal Status: Active Interventions: Administer decongestants, per physician orders, prior to HBO2 Administer the correct therapeutic gas delivery based on the patients needs and limitations, per physician order Assess and provide for patients comfort related to the hyperbaric environment and equalization of middle ear Assess for signs and symptoms related to adverse events, including but  not limited to confinement anxiety, pneumothorax, oxygen toxicity and baurotrauma Assess patient for any history of confinement anxiety Assess patient's knowledge and expectations regarding hyperbaric medicine and provide education related to the hyperbaric environment, goals of treatment and prevention of adverse events Implement protocols to decrease risk of pneumothorax in high risk patients Notes: Nutrition Nursing Diagnoses: Impaired glucose control: actual or potential Goals: Patient/caregiver verbalizes understanding of need to maintain therapeutic glucose control per primary care physician Date Initiated: 08/25/2021 Target Resolution Date: 12/10/2021 Goal Status: Active Interventions: Assess HgA1c results as ordered upon admission and as needed Provide education on elevated blood sugars and impact on wound healing Notes: Osteomyelitis Nursing Diagnoses: Infection: osteomyelitis Knowledge deficit related to disease process and management Goals: Patient's osteomyelitis will resolve Date Initiated: 09/17/2021 Target Resolution Date: 12/10/2021 Goal Status: Active Interventions: Assess for signs and symptoms of osteomyelitis  resolution every visit Provide education on osteomyelitis Treatment Activities: Surgical debridement : 09/17/2021 Systemic antibiotics : 09/17/2021 T ordered outside of clinic : 09/17/2021 est Notes: Wound/Skin Impairment Nursing Diagnoses: Impaired tissue integrity Goals: Patient/caregiver will verbalize understanding of skin care regimen Date Initiated: 08/25/2021 Target Resolution Date: 12/10/2021 Goal Status: Active Ulcer/skin breakdown will have a volume reduction of 30% by week 4 Date Initiated: 08/25/2021 Date Inactivated: 10/08/2021 Target Resolution Date: 10/20/2021 Goal Status: Met Interventions: Assess patient/caregiver ability to obtain necessary supplies Assess patient/caregiver ability to perform ulcer/skin care regimen upon admission and as  needed Assess ulceration(s) every visit Provide education on ulcer and skin care Treatment Activities: Topical wound management initiated : 08/25/2021 Notes: Electronic Signature(s) Signed: 11/12/2021 5:10:34 PM By: Adline Peals Signed: 11/12/2021 5:10:34 PM By: Adline Peals Entered By: Adline Peals on 11/12/2021 12:30:27 -------------------------------------------------------------------------------- Pain Assessment Details Patient Name: Date of Service: [name] 11/12/2021 12:45 PM Medical Record Number: [id number] Patient Account Number: [id number] Date of Birth/Sex: Treating RN: 1956/04/09 (66 y.o. [name] Primary Care Chrissy Ealey: [name] Other Clinician: Referring Dennys Traughber: Treating Abisai Deer/Extender: [name] Weeks in Treatment: 11 Active Problems Location of Pain Severity and Description of Pain Patient Has Paino No Site Locations Rate the pain. Current Pain Level: 0 Pain Management and Medication Current Pain Management: Electronic Signature(s) Signed: 11/12/2021 5:10:34 PM By: Adline Peals Entered By: Adline Peals on 11/12/2021 12:29:57 -------------------------------------------------------------------------------- Patient/Caregiver Education Details Patient Name: Date of Service: NA [name] 6/2/2023andnbsp12:45 PM Medical Record Number: [id number] Patient Account Number: [id number] Date of Birth/Gender: Treating RN: Apr 11, 1956 (66 y.o. [name] Primary Care Physician: [name] Other Clinician: Referring Physician: Treating Physician/Extender: [name] in Treatment: 11 Education Assessment Education Provided To: Patient Education Topics Provided Wound/Skin Impairment: Methods: Explain/Verbal Responses: Reinforcements needed, State content correctly Electronic Signature(s) Signed: 11/12/2021 5:10:34 PM By: Adline Peals Entered By:  Adline Peals on 11/12/2021 12:30:41 -------------------------------------------------------------------------------- Wound Assessment Details Patient Name: Date of Service: Colin Loveless A. 11/12/2021 12:45 PM Medical Record Number: [id number] Patient Account Number: [id number] Date of Birth/Sex: Treating RN: January 06, 1956 (66 y.o. [name] Primary Care Donnesha Karg: [name] Other Clinician: Referring Shavawn Stobaugh: Treating Lateisha Thurlow/Extender: [name] Weeks in Treatment: 11 Wound Status Wound Number: 1 Primary Diabetic Wound/Ulcer of the Lower Extremity Etiology: Wound Location: Left, Medial Foot Wound Open Wounding Event: Gradually Appeared Status: Date Acquired: 02/22/2021 Comorbid Cataracts, Coronary Artery Disease, Hypertension, Myocardial Weeks Of Treatment: 11 History: Infarction, Peripheral Arterial Disease, Type II Diabetes, Clustered Wound: No Osteomyelitis, Neuropathy Photos Wound Measurements Length: (cm) 7 Width: (cm) 3.6 Depth: (cm) 0.3 Area: (cm) 19.792 Volume: (cm) 5.938 % Reduction in Area: -40% % Reduction in Volume: 58% Epithelialization: Small (1-33%) Tunneling: Yes Location 1 Position (o'clock): 12 Maximum Distance: (cm) 3.4 Location 2 Position (o'clock): 9 Maximum Distance: (cm) 2.4 Undermining: No Wound Description Classification: Grade 3 Wound Margin: Distinct, outline attached Exudate Amount: Medium Exudate Type: Serosanguineous Exudate Color: red, brown Foul Odor After Cleansing: No Slough/Fibrino Yes Wound Bed Granulation Amount: Large (67-100%) Exposed Structure Granulation Quality: Red, Pink, Hyper-granulation Fascia Exposed: No Necrotic Amount: Small (1-33%) Fat Layer (Subcutaneous Tissue) Exposed: Yes Necrotic Quality: Adherent Slough Tendon Exposed: No Muscle Exposed: No Joint Exposed: No Bone Exposed: Yes Electronic Signature(s) Signed: 11/12/2021 5:10:34 PM By: Adline Peals Signed:  11/12/2021 5:25:26 PM By: Dellie Catholic RN Entered By: Dellie Catholic on 11/12/2021 12:48:20 -------------------------------------------------------------------------------- Vitals Details Patient Name: Date of Service: NA Colin  Colin Sportsman A. 11/12/2021 12:45 PM Medical Record Number: [id number] Patient Account Number: [id number] Date of Birth/Sex: Treating RN: [birth year] (66 y.o. [name] Primary Care Izza Bickle: [name] Other Clinician: Referring Drayson Dorko: Treating Yoselyn Mcglade/Extender: [name] Weeks in Treatment: 11 Vital Signs Time Taken: 12:39 Temperature (F): 97.9 Height (in): 74 Pulse (bpm): 91 Weight (lbs): 186 Respiratory Rate (breaths/min): 16 Body Mass Index (BMI): 23.9 Blood Pressure (mmHg): 144/72 Capillary Blood Glucose (mg/dl): 143 Reference Range: 80 - 120 mg / dl Electronic Signature(s) Signed: 11/12/2021 5:10:34 PM By: Adline Peals Entered By: Adline Peals on 11/12/2021 12:39:57

## 2021-11-13 DIAGNOSIS — T8131XA Disruption of external operation (surgical) wound, not elsewhere classified, initial encounter: Secondary | ICD-10-CM | POA: Diagnosis not present

## 2021-11-14 DIAGNOSIS — T8131XA Disruption of external operation (surgical) wound, not elsewhere classified, initial encounter: Secondary | ICD-10-CM | POA: Diagnosis not present

## 2021-11-15 ENCOUNTER — Encounter (HOSPITAL_BASED_OUTPATIENT_CLINIC_OR_DEPARTMENT_OTHER): Payer: BC Managed Care – PPO | Admitting: General Surgery

## 2021-11-15 DIAGNOSIS — T8131XA Disruption of external operation (surgical) wound, not elsewhere classified, initial encounter: Secondary | ICD-10-CM | POA: Diagnosis not present

## 2021-11-15 DIAGNOSIS — L97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: Secondary | ICD-10-CM | POA: Diagnosis not present

## 2021-11-15 DIAGNOSIS — E11610 Type 2 diabetes mellitus with diabetic neuropathic arthropathy: Secondary | ICD-10-CM | POA: Diagnosis not present

## 2021-11-15 DIAGNOSIS — Z7984 Long term (current) use of oral hypoglycemic drugs: Secondary | ICD-10-CM | POA: Diagnosis not present

## 2021-11-15 DIAGNOSIS — Z794 Long term (current) use of insulin: Secondary | ICD-10-CM | POA: Diagnosis not present

## 2021-11-15 DIAGNOSIS — E785 Hyperlipidemia, unspecified: Secondary | ICD-10-CM | POA: Diagnosis not present

## 2021-11-15 DIAGNOSIS — B372 Candidiasis of skin and nail: Secondary | ICD-10-CM | POA: Diagnosis not present

## 2021-11-15 DIAGNOSIS — E11622 Type 2 diabetes mellitus with other skin ulcer: Secondary | ICD-10-CM | POA: Diagnosis not present

## 2021-11-15 DIAGNOSIS — K219 Gastro-esophageal reflux disease without esophagitis: Secondary | ICD-10-CM | POA: Diagnosis not present

## 2021-11-15 DIAGNOSIS — E11621 Type 2 diabetes mellitus with foot ulcer: Secondary | ICD-10-CM | POA: Diagnosis not present

## 2021-11-15 DIAGNOSIS — D649 Anemia, unspecified: Secondary | ICD-10-CM | POA: Diagnosis not present

## 2021-11-15 DIAGNOSIS — I252 Old myocardial infarction: Secondary | ICD-10-CM | POA: Diagnosis not present

## 2021-11-15 DIAGNOSIS — L97522 Non-pressure chronic ulcer of other part of left foot with fat layer exposed: Secondary | ICD-10-CM | POA: Diagnosis not present

## 2021-11-15 DIAGNOSIS — I10 Essential (primary) hypertension: Secondary | ICD-10-CM | POA: Diagnosis not present

## 2021-11-15 DIAGNOSIS — E1169 Type 2 diabetes mellitus with other specified complication: Secondary | ICD-10-CM | POA: Diagnosis not present

## 2021-11-15 DIAGNOSIS — Z951 Presence of aortocoronary bypass graft: Secondary | ICD-10-CM | POA: Diagnosis not present

## 2021-11-15 DIAGNOSIS — Z7982 Long term (current) use of aspirin: Secondary | ICD-10-CM | POA: Diagnosis not present

## 2021-11-15 DIAGNOSIS — I2510 Atherosclerotic heart disease of native coronary artery without angina pectoris: Secondary | ICD-10-CM | POA: Diagnosis not present

## 2021-11-15 DIAGNOSIS — E1140 Type 2 diabetes mellitus with diabetic neuropathy, unspecified: Secondary | ICD-10-CM | POA: Diagnosis not present

## 2021-11-15 DIAGNOSIS — M1990 Unspecified osteoarthritis, unspecified site: Secondary | ICD-10-CM | POA: Diagnosis not present

## 2021-11-15 DIAGNOSIS — Z792 Long term (current) use of antibiotics: Secondary | ICD-10-CM | POA: Diagnosis not present

## 2021-11-15 DIAGNOSIS — M86672 Other chronic osteomyelitis, left ankle and foot: Secondary | ICD-10-CM | POA: Diagnosis not present

## 2021-11-15 DIAGNOSIS — Z452 Encounter for adjustment and management of vascular access device: Secondary | ICD-10-CM | POA: Diagnosis not present

## 2021-11-15 LAB — GLUCOSE, CAPILLARY
Glucose-Capillary: 89 mg/dL (ref 70–99)
Glucose-Capillary: 115 mg/dL — ABNORMAL HIGH (ref 70–99)
Glucose-Capillary: 128 mg/dL — ABNORMAL HIGH (ref 70–99)
Glucose-Capillary: 249 mg/dL — ABNORMAL HIGH (ref 70–99)

## 2021-11-15 NOTE — Progress Notes (Signed)
[name], [name] ([id number]) Visit Report for 11/15/2021 Arrival Information Details Patient Name: Date of Service: NA [name] 11/15/2021 10:00 A M Medical Record Number: [id number] Patient Account Number: [id number] Date of Birth/Sex: Treating RN: 1956-02-09 (66 y.o. [name] Primary Care Vrinda Heckstall: [name] Other Clinician: Referring Dorsel Flinn: Treating Essica Kiker/Extender: [name] in Treatment: 11 Visit Information History Since Last Visit Added or deleted any medications: No Patient Arrived: Knee Scooter Any new allergies or adverse reactions: No Arrival Time: 09:31 Had a fall or experienced change in No Accompanied By: self activities of daily living that may affect Transfer Assistance: None risk of falls: Patient Identification Verified: Yes Signs or symptoms of abuse/neglect since last visito No Secondary Verification Process Completed: Yes Hospitalized since last visit: No Patient Requires Transmission-Based Precautions: No Implantable device outside of the clinic excluding No Patient Has Alerts: Yes cellular tissue based products placed in the center Patient Alerts: Patient on Blood Thinner since last visit: Has Dressing in Place as Prescribed: Yes Pain Present Now: No Electronic Signature(s) Signed: 11/15/2021 3:50:54 PM By: Shawn Stall RN, BSN Entered By: Shawn Stall on 11/15/2021 15:17:43 -------------------------------------------------------------------------------- Encounter Discharge Information Details Patient Name: Date of Service: NA Colin [name] 11/15/2021 10:00 A M Medical Record Number: [id number] Patient Account Number: [id number] Date of Birth/Sex: Treating RN: 11-03-1955 (66 y.o. [name] Primary Care Alecea Trego: [name] Other Clinician: Referring Danton Palmateer: Treating Gerritt Galentine/Extender: [name] in Treatment: 11 Encounter Discharge Information Items Discharge Condition:  Stable Ambulatory Status: Knee Scooter Discharge Destination: Home Transportation: Private Auto Accompanied By: self Schedule Follow-up Appointment: Yes Clinical Summary of Care: Electronic Signature(s) Signed: 11/15/2021 3:50:54 PM By: Shawn Stall RN, BSN Entered By: Shawn Stall on 11/15/2021 15:50:32 -------------------------------------------------------------------------------- Patient/Caregiver Education Details Patient Name: Date of Service: NA [name] 6/5/2023andnbsp10:00 A M Medical Record Number: [id number] Patient Account Number: [id number] Date of Birth/Gender: Treating RN: 1955-11-24 (66 y.o. [name] Primary Care Physician: [name] Other Clinician: Referring Physician: Treating Physician/Extender: [name] in Treatment: 11 Education Assessment Education Provided To: Patient Education Topics Provided Wound/Skin Impairment: Handouts: Skin Care Do's and Dont's Methods: Explain/Verbal Responses: Reinforcements needed Electronic Signature(s) Signed: 11/15/2021 3:50:54 PM By: Shawn Stall RN, BSN Entered By: Shawn Stall on 11/15/2021 15:50:17 -------------------------------------------------------------------------------- Vitals Details Patient Name: Date of Service: NA Colin [name] 11/15/2021 10:00 A M Medical Record Number: [id number] Patient Account Number: [id number] Date of Birth/Sex: Treating RN: 07-26-1955 (66 y.o. [name] Primary Care Robt Okuda: [name] Other Clinician: Referring Rakiya Krawczyk: Treating Jasmeet Manton/Extender: [name]: 11 Vital Signs Time Taken: 09:31 Temperature (F): 97.3 Height (in): 74 Pulse (bpm): 76 Weight (lbs): 186 Respiratory Rate (breaths/min): 16 Body Mass Index (BMI): 23.9 Blood Pressure (mmHg): 151/93 Capillary Blood Glucose (mg/dl): 89 Reference Range: 80 - 120 mg / dl Electronic Signature(s) Signed: 11/15/2021 3:50:54 PM  By: Shawn Stall RN, BSN Entered By: Shawn Stall on 11/15/2021 15:43:56

## 2021-11-15 NOTE — Progress Notes (Signed)
ZYKEEM, BAUSERMAN ([id number]) Visit Report for 11/11/2021 Arrival Information Details Patient Name: Date of Service: NA [name] 11/11/2021 10:00 A M Medical Record Number: [id number] Patient Account Number: [id number] Date of Birth/Sex: Treating RN: 1956-06-06 (66 y.o. [name] Primary Care Sheryn Aldaz: [name] Other Clinician: [name] Referring Joron Velis: Treating Leianne Callins/Extender: [name] in Treatment: 11 Visit Information History Since Last Visit All ordered tests and consults were completed: Yes Patient Arrived: Knee Scooter Added or deleted any medications: No Arrival Time: 09:26 Any new allergies or adverse reactions: No Accompanied By: self Had a fall or experienced change in No Transfer Assistance: None activities of daily living that may affect Patient Identification Verified: Yes risk of falls: Secondary Verification Process Completed: Yes Signs or symptoms of abuse/neglect since last visito No Patient Requires Transmission-Based Precautions: No Hospitalized since last visit: No Patient Has Alerts: Yes Implantable device outside of the clinic excluding No Patient Alerts: Patient on Blood Thinner cellular tissue based products placed in the center since last visit: Has Dressing in Place as Prescribed: Yes Pain Present Now: No Electronic Signature(s) Signed: 11/15/2021 11:16:55 AM By: [name] CHT EMT BS , , Entered By: [name] on 11/11/2021 10:28:26 -------------------------------------------------------------------------------- Encounter Discharge Information Details Patient Name: Date of Service: NA [name] 11/11/2021 10:00 A M Medical Record Number: [id number] Patient Account Number: [id number] Date of Birth/Sex: Treating RN: September 12, 1955 (66 y.o. [name] Primary Care Saraia Platner: [name] Other Clinician: [name] Referring Canio Winokur: Treating Taleia Sadowski/Extender:  [name] in Treatment: 11 Encounter Discharge Information Items Discharge Condition: Stable Ambulatory Status: Knee Scooter Discharge Destination: Home Transportation: Private Auto Accompanied By: Wife Schedule Follow-up Appointment: Yes Clinical Summary of Care: Electronic Signature(s) Signed: 11/11/2021 1:06:23 PM By: [name] EMT Entered By: [name] on 11/11/2021 13:06:23 -------------------------------------------------------------------------------- Vitals Details Patient Name: Date of Service: NA [name] 11/11/2021 10:00 A M Medical Record Number: [id number] Patient Account Number: [id number] Date of Birth/Sex: Treating RN: [birth year] (66 y.o. [name] Primary Care Maybell Misenheimer: [name] Other Clinician: [name] Referring Rinda Rollyson: Treating Drena Ham/Extender: [name]: 11 Vital Signs Time Taken: 09:38 Temperature (F): 98.4 Height (in): 74 Pulse (bpm): 77 Weight (lbs): 186 Respiratory Rate (breaths/min): 16 Body Mass Index (BMI): 23.9 Blood Pressure (mmHg): 112/71 Capillary Blood Glucose (mg/dl): 500 Reference Range: 80 - 120 mg / dl Electronic Signature(s) Signed: 11/15/2021 11:16:55 AM By: [name] CHT EMT BS , , Entered By: [name] on 11/11/2021 10:48:29

## 2021-11-15 NOTE — Progress Notes (Signed)
[name], [place] ([id number]) Visit Report for 11/15/2021 HBO Details Patient Name: Date of Service: NA [name] 11/15/2021 10:00 A M Medical Record Number: [id number] Patient Account Number: [id number] Date of Birth/Sex: Treating RN: May 12, 1956 (66 y.o. [name], [name] Primary Care Tryson Lumley: [name] Other Clinician: Referring Charnika Herbst: Treating Allannah Kempen/Extender: [name]: 11 HBO Treatment Course Details Treatment Course Number: 1 Ordering Jeanine Caven: [name] T Treatments Ordered: otal 80 HBO Treatment Start Date: 09/20/2021 HBO Indication: Diabetic Ulcer(s) of the Lower Extremity Standard/Conservative Wound Care tried and failed greater than or equal to 30 days HBO Treatment Details Treatment Number: 39 Patient Type: Outpatient Chamber Type: Monoplace Chamber Serial #: [id number] Treatment Protocol: 2.5 ATA with 90 minutes oxygen, with two 5 minute air breaks Treatment Details Compression Rate Down: 2.5 psi / minute De-Compression Rate Up: 2.5 psi / minute A breaks and breathing ir Compress Tx Pressure periods Decompress Decompress Begins Reached (leave unused spaces Begins Ends blank) Chamber Pressure (ATA 1 2.5 2.5 2.5 2.5 2.5 - - 2.5 1 ) Clock Time (24 hr) 10:11 10:12 10:51 10:56 11:26 11:31 - - 12:02 12:11 Treatment Length: 120 (minutes) Treatment Segments: 4 Vital Signs Capillary Blood Glucose Reference Range: 80 - 120 mg / dl HBO Diabetic Blood Glucose Intervention Range: <131 mg/dl or >696 mg/dl Time Vitals Blood Respiratory Capillary Blood Glucose Pulse Action Type: Pulse: Temperature: Taken: Pressure: Rate: Glucose (mg/dl): Meter #: Oximetry (%) Taken: Pre 09:31 151/93 76 16 97.3 89 see note below. Post 12:12 164/87 72 16 98.1 249 Treatment Response Treatment Toleration: Well Treatment Completion Status: Treatment Completed without Adverse Event Treatment Notes 0930 blood glucose 89; glucerna  given. 1007 Recheck glucose 128; second glucerna given. 1010 Rechecked glucose 145. Myron Lona made aware and proceed with treatment. Additional Procedure Documentation Tissue Sevierity: Necrosis of bone Physician HBO Attestation: I certify that I supervised this HBO treatment in accordance with Medicare guidelines. A trained emergency response team is readily available per Yes hospital policies and procedures. Continue HBOT as ordered. Yes Electronic Signature(s) Signed: 11/15/2021 4:29:59 PM By: [name] MD FACS Previous Signature: 11/15/2021 3:50:54 PM Version By: Shawn Stall RN, BSN Entered By: [name] on 11/15/2021 16:29:59 -------------------------------------------------------------------------------- HBO Safety Checklist Details Patient Name: Date of Service: NA Colin Nephew A. 11/15/2021 10:00 A M Medical Record Number: [id number] Patient Account Number: [id number] Date of Birth/Sex: Treating RN: Sep 04, 1955 (66 y.o. [name], [name] Primary Care Robena Ewy: [name] Other Clinician: Referring Malahki Gasaway: Treating Kaloni Bisaillon/Extender: [name]: 11 HBO Safety Checklist Items Safety Checklist Consent Form Signed Patient voided / foley secured and emptied When did you last eato 0830 Last dose of injectable or oral agent 0715 Ostomy pouch emptied and vented if applicable NA All implantable devices assessed, documented and approved Intravenous access site secured and place Valuables secured Linens and cotton and cotton/polyester blend (less than 51% polyester) Personal oil-based products / skin lotions / body lotions removed NA Wigs or hairpieces removed NA Smoking or tobacco materials removed NA Books / newspapers / magazines / loose paper removed Cologne, aftershave, perfume and deodorant removed Jewelry removed (may wrap wedding band) NA Make-up removed NA Hair care products removed NA Battery operated devices  (external) removed Heating patches and chemical warmers removed NA Titanium eyewear removed NA Nail polish cured greater than 10 hours NA Casting material cured greater than 10 hours NA Hearing aids removed NA Loose dentures or partials removed NA Prosthetics have been  removed NA Patient demonstrates correct use of air break device (if applicable) Patient concerns have been addressed Patient grounding bracelet on and cord attached to chamber Specifics for Inpatients (complete in addition to above) Medication sheet sent with patient Intravenous medications needed or due during therapy sent with patient Drainage tubes (e.g. nasogastric tube or chest tube secured and vented) Endotracheal or Tracheotomy tube secured Cuff deflated of air and inflated with saline Airway suctioned Electronic Signature(s) Signed: 11/15/2021 3:50:54 PM By: Shawn Stall RN, BSN Entered By: Shawn Stall on 11/15/2021 15:46:52

## 2021-11-15 NOTE — Progress Notes (Signed)
KIRKLIN, MCDUFFEE ([id number]) Visit Report for 11/12/2021 SuperBill Details Patient Name: Date of Service: NA [name] 11/12/2021 Medical Record Number: [id number] Patient Account Number: [id number] Date of Birth/Sex: Treating RN: 1956/02/27 (66 y.o. 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 Facility Procedures CPT4 Code Description Modifier Quantity [id number] G0277-(Facility Use Only) HBOT full body chamber, , 4 ICD-10 Diagnosis Description E11.621 Type 2 diabetes mellitus with foot ulcer 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 M86.672 Other chronic osteomyelitis, left ankle and foot 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

## 2021-11-15 NOTE — Progress Notes (Signed)
Colin, [name] ([id number]) Visit Report for 11/12/2021 HBO Details Patient Name: Date of Service: NA [name] 11/12/2021 10:00 A M Medical Record Number: [id number] Patient Account Number: [id number] Date of Birth/Sex: Treating RN: [birth year] (66 y.o. [name] Primary Care Triana Coover: [name] Other Clinician: [name] Referring Mackson Botz: Treating Alexica Schlossberg/Extender: [name]: 11 HBO Treatment Course Details Treatment Course Number: 1 Ordering Cleatus Goodin: [name] T Treatments Ordered: otal 80 HBO Treatment Start Date: 09/20/2021 HBO Indication: Diabetic Ulcer(s) of the Lower Extremity Standard/Conservative Wound Care tried and failed greater than or equal to 30 days HBO Treatment Details Treatment Number: 38 Patient Type: Outpatient Chamber Type: Monoplace Chamber Serial #: [id number] Treatment Protocol: 2.5 ATA with 90 minutes oxygen, with two 5 minute air breaks Treatment Details Compression Rate Down: 2.0 psi / minute De-Compression Rate Up: 2.0 psi / minute A breaks and breathing ir Compress Tx Pressure periods Decompress Decompress Begins Reached (leave unused spaces Begins Ends blank) Chamber Pressure (ATA 1 2.5 2.5 2.5 2.5 2.5 - - 2.5 1 ) Clock Time (24 hr) 09:40 09:52 10:22 10:27 10:57 11:02 - - 11:32 11:41 Treatment Length: 121 (minutes) Treatment Segments: 4 Vital Signs Capillary Blood Glucose Reference Range: 80 - 120 mg / dl HBO Diabetic Blood Glucose Intervention Range: <131 mg/dl or >528 mg/dl Type: Time Vitals Blood Respiratory Capillary Blood Glucose Pulse Action Pulse: Temperature: Taken: Pressure: Rate: Glucose (mg/dl): Meter #: Oximetry (%) Taken: Pre 09:34 115/72 86 16 97.9 141 2 none per protocol Post 11:43 150/86 78 16 98.1 182 2 none per protocol Treatment Response Treatment Completion Status: Treatment Completed without Adverse Event Additional Procedure Documentation Tissue  Sevierity: Necrosis of bone Physician HBO Attestation: I certify that I supervised this HBO treatment in accordance with Medicare guidelines. A trained emergency response team is readily available per Yes hospital policies and procedures. Continue HBOT as ordered. Yes Electronic Signature(s) Signed: 11/12/2021 4:54:26 PM By: [name] MD FACS Entered By: [name] on 11/12/2021 16:54:26 -------------------------------------------------------------------------------- HBO Safety Checklist Details Patient Name: Date of Service: NA Colin Nephew A. 11/12/2021 10:00 A M Medical Record Number: [id number] Patient Account Number: [id number] Date of Birth/Sex: Treating RN: 1955-12-05 (66 y.o. [name] Primary Care Cherelle Midkiff: [name] Other Clinician: [name] Referring Lagina Reader: Treating Ruqaya Strauss/Extender: [name]: 11 HBO Safety Checklist Items Safety Checklist Consent Form Signed Patient voided / foley secured and emptied When did you last eato 0745 Last dose of injectable or oral agent 0745 Ostomy pouch emptied and vented if applicable NA All implantable devices assessed, documented and approved NA Intravenous access site secured and place NA Valuables secured Linens and cotton and cotton/polyester blend (less than 51% polyester) Personal oil-based products / skin lotions / body lotions removed Wigs or hairpieces removed NA Smoking or tobacco materials removed NA Books / newspapers / magazines / loose paper removed Cologne, aftershave, perfume and deodorant removed Jewelry removed (may wrap wedding band) Make-up removed NA Hair care products removed Battery operated devices (external) removed Heating patches and chemical warmers removed Titanium eyewear removed NA Nail polish cured greater than 10 hours NA Casting material cured greater than 10 hours NA Hearing aids removed NA Loose dentures or  partials removed NA Prosthetics have been removed NA Patient demonstrates correct use of air break device (if applicable) Patient concerns have been addressed Patient grounding bracelet on and cord attached to chamber Specifics for Inpatients (complete in addition to  above) Medication sheet sent with patient NA Intravenous medications needed or due during therapy sent with patient NA Drainage tubes (e.g. nasogastric tube or chest tube secured and vented) NA Endotracheal or Tracheotomy tube secured NA Cuff deflated of air and inflated with saline NA Airway suctioned NA Notes Paper version used prior to treatment. Electronic Signature(s) Signed: 11/15/2021 11:16:55 AM By: [name] CHT EMT BS , , Entered By: [name] on 11/12/2021 10:38:23

## 2021-11-15 NOTE — Progress Notes (Signed)
[name], [name] Visit Report for 11/11/2021 HBO Details Patient Name: Date of Service: NA [name] Nephew A. 11/11/2021 10:00 A M Medical Record Number: [id number] Patient Account Number: [id number] Date of Birth/Sex: Treating RN: May 26, 1956 (66 y.o. [name] [name] Primary Care [name] [name]: [name] [name] Other Clinician: [name] Referring [name] [name]: Treating [name] [name]/Extender: [name] [name] Weeks in Treatment: 11 HBO Treatment Course Details Treatment Course Number: 1 Ordering [name] [name]: [name] T Treatments Ordered: otal 80 HBO Treatment Start Date: 09/20/2021 HBO Indication: Diabetic Ulcer(s) of the Lower Extremity Standard/Conservative Wound Care tried and failed greater than or equal to 30 days HBO Treatment Details Treatment Number: 37 Patient Type: Outpatient Chamber Type: Monoplace Chamber Serial #: [id number] Treatment Protocol: 2.5 ATA with 90 minutes oxygen, with two 5 minute air breaks Treatment Details Compression Rate Down: 2.0 psi / minute De-Compression Rate Up: 2.0 psi / minute A breaks and breathing ir Compress Tx Pressure periods Decompress Decompress Begins Reached (leave unused spaces Begins Ends blank) Chamber Pressure (ATA 1 2.5 2.5 2.5 2.5 2.5 - - 2.5 1 ) Clock Time (24 hr) 10:00 10:13 10:43 10:48 11:18 11:23 - - 11:53 12:02 Treatment Length: 122 (minutes) Treatment Segments: 4 Vital Signs Capillary Blood Glucose Reference Range: 80 - 120 mg / dl HBO Diabetic Blood Glucose Intervention Range: <131 mg/dl or >885 mg/dl Type: Time Vitals Blood Pulse: Respiratory Temperature: Capillary Blood Glucose Pulse Action Taken: Pressure: Rate: Glucose (mg/dl): Meter #: Oximetry (%) Taken: Pre 09:38 112/71 77 16 98.4 118 2 (0940) 8 oz Glucerna given Pre 09:58 129 2 Physician allowed CBG @15  min after Glucerna Post 12:07 162/90 71 16 97.6 147 Treatment Response Treatment Toleration: Well Treatment Completion Status: Treatment  Completed without Adverse Event Additional Procedure Documentation Tissue Sevierity: Bone involvement without necrosis Physician HBO Attestation: I certify that I supervised this HBO treatment in accordance with Medicare guidelines. A trained emergency response team is readily available per Yes hospital policies and procedures. Continue HBOT as ordered. Yes Electronic Signature(s) Signed: 11/11/2021 1:53:37 PM By: 01/11/2022 CHT EMT BS , , Signed: 11/11/2021 4:49:27 PM By: 01/11/2022 MD FACS Previous Signature: 11/11/2021 1:52:36 PM Version By: 01/11/2022 MD FACS Previous Signature: 11/11/2021 1:04:32 PM Version By: 01/11/2022 EMT Entered By: [name] [name] on 11/11/2021 13:53:36 -------------------------------------------------------------------------------- HBO Safety Checklist Details Patient Name: Date of Service: 01/11/2022 A. 11/11/2021 10:00 A M Medical Record Number: 01/11/2022 Patient Account Number: [id number] Date of Birth/Sex: Treating RN: [birth year] (66 y.o. 77 Primary Care [name] [name]: [name] [name] Other Clinician: [name] Referring [name] [name]: Treating [name] [name]/Extender: [name] Weeks in Treatment: 11 HBO Safety Checklist Items Safety Checklist Consent Form Signed Patient voided / foley secured and emptied When did you last eato 0645 Last dose of injectable or oral agent 0645 Ostomy pouch emptied and vented if applicable NA All implantable devices assessed, documented and approved NA Intravenous access site secured and place NA Valuables secured Linens and cotton and cotton/polyester blend (less than 51% polyester) Personal oil-based products / skin lotions / body lotions removed Wigs or hairpieces removed NA Smoking or tobacco materials removed NA Books / newspapers / magazines / loose paper removed Cologne, aftershave, perfume and deodorant removed Jewelry removed (may wrap wedding  band) Make-up removed NA Hair care products removed Battery operated devices (external) removed Heating patches and chemical warmers removed Titanium eyewear removed NA Nail polish cured greater than 10 hours NA Casting material cured greater than 10 hours NA  Hearing aids removed NA Loose dentures or partials removed NA Prosthetics have been removed NA Patient demonstrates correct use of air break device (if applicable) Patient concerns have been addressed Patient grounding bracelet on and cord attached to chamber Specifics for Inpatients (complete in addition to above) Medication sheet sent with patient NA Intravenous medications needed or due during therapy sent with patient NA Drainage tubes (e.g. nasogastric tube or chest tube secured and vented) NA Endotracheal or Tracheotomy tube secured NA Cuff deflated of air and inflated with saline NA Airway suctioned NA Notes Paper version used prior to treatment. Electronic Signature(s) Signed: 11/15/2021 11:16:55 AM By: [name] CHT EMT BS , , Entered By: [name] on 11/11/2021 10:31:18

## 2021-11-15 NOTE — Progress Notes (Signed)
GARIN, MATA ([id number]) Visit Report for 11/12/2021 Arrival Information Details Patient Name: Date of Service: NA [name] 11/12/2021 10:00 A M Medical Record Number: [id number] Patient Account Number: [id number] Date of Birth/Sex: Treating RN: 28-May-1956 (66 y.o. [name] Primary Care Annastacia Duba: [name] Other Clinician: [name] Referring [place]: Treating Cloie Wooden/Extender: [name] in Treatment: 11 Visit Information History Since Last Visit All ordered tests and consults were completed: Yes Patient Arrived: Knee Scooter Added or deleted any medications: No Arrival Time: 09:19 Any new allergies or adverse reactions: No Accompanied By: self Had a fall or experienced change in No Transfer Assistance: None activities of daily living that may affect Patient Identification Verified: Yes risk of falls: Secondary Verification Process Completed: Yes Signs or symptoms of abuse/neglect since last visito No Patient Requires Transmission-Based Precautions: No Hospitalized since last visit: No Patient Has Alerts: Yes Implantable device outside of the clinic excluding No Patient Alerts: Patient on Blood Thinner cellular tissue based products placed in the center since last visit: Pain Present Now: No Electronic Signature(s) Signed: 11/15/2021 11:16:55 AM By: [name] CHT EMT BS , , Entered By: [name] on 11/12/2021 10:40:22 -------------------------------------------------------------------------------- Encounter Discharge Information Details Patient Name: Date of Service: NA Colin Nephew A. 11/12/2021 10:00 A M Medical Record Number: [id number] Patient Account Number: [id number] Date of Birth/Sex: Treating RN: [birth year] (66 y.o. [name] Primary Care Yassmin Binegar: [name] Other Clinician: [name] Referring Bell[place] Sylvan: Treating Johnthomas Lader/Extender: [name] in  Treatment: 11 Encounter Discharge Information Items Discharge Condition: Stable Ambulatory Status: Knee Scooter Discharge Destination: Home Transportation: Private Auto Accompanied By: spouse Schedule Follow-up Appointment: No Clinical Summary of Care: Electronic Signature(s) Signed: 11/15/2021 11:16:55 AM By: [name] CHT EMT BS , , Entered By: [name] on 11/12/2021 12:16:22 -------------------------------------------------------------------------------- Vitals Details Patient Name: Date of Service: NA Colin [name] 11/12/2021 10:00 A M Medical Record Number: [id number] Patient Account Number: [id number] Date of Birth/Sex: Treating RN: 1955/08/27 (66 y.o. [name] Primary Care Keonte Daubenspeck: [name] Other Clinician: [name] Referring Rayyan Burley: Treating Aquilla Voiles/Extender: [name]: 11 Vital Signs Time Taken: 09:34 Temperature (F): 97.9 Height (in): 74 Pulse (bpm): 86 Weight (lbs): 186 Respiratory Rate (breaths/min): 16 Body Mass Index (BMI): 23.9 Blood Pressure (mmHg): 115/72 Capillary Blood Glucose (mg/dl): 694 Reference Range: 80 - 120 mg / dl Electronic Signature(s) Signed: 11/15/2021 11:16:55 AM By: [name] CHT EMT BS , , Entered By: [name] on 11/12/2021 10:37:10

## 2021-11-15 NOTE — Progress Notes (Signed)
RUDDY, SWIRE ([id number]) Visit Report for 11/15/2021 SuperBill Details Patient Name: Date of Service: NA [name] 11/15/2021 Medical Record Number: [id number] Patient Account Number: [id number] Date of Birth/Sex: Treating RN: [birth year] (66 y.o. 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

## 2021-11-16 ENCOUNTER — Encounter (HOSPITAL_BASED_OUTPATIENT_CLINIC_OR_DEPARTMENT_OTHER): Payer: BC Managed Care – PPO | Admitting: General Surgery

## 2021-11-16 DIAGNOSIS — L97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: Secondary | ICD-10-CM | POA: Diagnosis not present

## 2021-11-16 DIAGNOSIS — T8131XA Disruption of external operation (surgical) wound, not elsewhere classified, initial encounter: Secondary | ICD-10-CM | POA: Diagnosis not present

## 2021-11-16 DIAGNOSIS — E11610 Type 2 diabetes mellitus with diabetic neuropathic arthropathy: Secondary | ICD-10-CM | POA: Diagnosis not present

## 2021-11-16 DIAGNOSIS — B372 Candidiasis of skin and nail: Secondary | ICD-10-CM | POA: Diagnosis not present

## 2021-11-16 DIAGNOSIS — E11622 Type 2 diabetes mellitus with other skin ulcer: Secondary | ICD-10-CM | POA: Diagnosis not present

## 2021-11-16 DIAGNOSIS — I2510 Atherosclerotic heart disease of native coronary artery without angina pectoris: Secondary | ICD-10-CM | POA: Diagnosis not present

## 2021-11-16 DIAGNOSIS — E1169 Type 2 diabetes mellitus with other specified complication: Secondary | ICD-10-CM | POA: Diagnosis not present

## 2021-11-16 DIAGNOSIS — Z951 Presence of aortocoronary bypass graft: Secondary | ICD-10-CM | POA: Diagnosis not present

## 2021-11-16 DIAGNOSIS — M86672 Other chronic osteomyelitis, left ankle and foot: Secondary | ICD-10-CM | POA: Diagnosis not present

## 2021-11-16 LAB — GLUCOSE, CAPILLARY
Glucose-Capillary: 224 mg/dL — ABNORMAL HIGH (ref 70–99)
Glucose-Capillary: 184 mg/dL — ABNORMAL HIGH (ref 70–99)

## 2021-11-16 NOTE — Progress Notes (Signed)
[name], [name] ([id number]) Visit Report for 11/16/2021 Arrival Information Details Patient Name: Date of Service: NA [name] 11/16/2021 10:00 A M Medical Record Number: [id number] Patient Account Number: [id number] Date of Birth/Sex: Treating RN: 1955-12-26 (66 y.o. [name] Primary Care [name]: [name] Other Clinician: [name] Referring [name]: Treating [name]/Extender: [name] in Treatment: 11 Visit Information History Since Last Visit All ordered tests and consults were completed: Yes Patient Arrived: Knee Scooter Added or deleted any medications: No Arrival Time: 09:26 Any new allergies or adverse reactions: No Accompanied By: None Had a fall or experienced change in No Transfer Assistance: None activities of daily living that may affect Patient Identification Verified: Yes risk of falls: Secondary Verification Process Completed: Yes Signs or symptoms of abuse/neglect since last visito No Patient Requires Transmission-Based Precautions: No Hospitalized since last visit: No Patient Has Alerts: Yes Implantable device outside of the clinic excluding No Patient Alerts: Patient on Blood Thinner cellular tissue based products placed in the center since last visit: Pain Present Now: No Electronic Signature(s) Signed: 11/16/2021 4:12:02 PM By: [name] EMT Entered By: [name] on 11/16/2021 16:12:02 -------------------------------------------------------------------------------- Encounter Discharge Information Details Patient Name: Date of Service: NA Colin [name] 11/16/2021 10:00 A M Medical Record Number: [id number] Patient Account Number: [id number] Date of Birth/Sex: Treating RN: [birth year] (66 y.o. [name] Primary Care [name]: [name] Other Clinician: [name] Referring [name]: Treating [name]/Extender: [name] in Treatment: 11 Encounter Discharge  Information Items Discharge Condition: Stable Ambulatory Status: Knee Scooter Discharge Destination: Home Transportation: Private Auto Accompanied By: None Schedule Follow-up Appointment: Yes Clinical Summary of Care: Electronic Signature(s) Signed: 11/16/2021 4:18:06 PM By: [name] EMT Entered By: [name] on 11/16/2021 16:18:06 -------------------------------------------------------------------------------- Vitals Details Patient Name: Date of Service: NA Colin [name] 11/16/2021 10:00 A M Medical Record Number: [id number] Patient Account Number: [id number] Date of Birth/Sex: Treating RN: 05/04/1956 (66 y.o. [name] Primary Care [name]: [name] Other Clinician: [name] Referring [name]: Treating [name]/Extender: [name] Weeks in Treatment: 11 Vital Signs Time Taken: 09:39 Temperature (F): 97.5 Height (in): 74 Pulse (bpm): 89 Weight (lbs): 186 Respiratory Rate (breaths/min): 16 Body Mass Index (BMI): 23.9 Blood Pressure (mmHg): 134/81 Capillary Blood Glucose (mg/dl): 366 Reference Range: 80 - 120 mg / dl Electronic Signature(s) Signed: 11/16/2021 4:14:04 PM By: [name] EMT Entered By: [name] on 11/16/2021 16:14:04

## 2021-11-16 NOTE — Progress Notes (Signed)
[name], STRENGTH ([id number]) Visit Report for 11/16/2021 Problem List Details Patient Name: Date of Service: NA Colin Nephew A. 11/16/2021 10:00 A M Medical Record Number: [id number] Patient Account Number: [id number] Date of Birth/Sex: Treating RN: 09/26/1955 (66 y.o. 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 11/16/2021 Medical Record Number: [id number] Patient Account Number: [id number] Date of Birth/Sex: Treating RN: [birth year] (66 y.o. 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 Facility Procedures CPT4 Code: 21975883 Description: G0277-(Facility Use Only) HBOT full body chamber, , ICD-10 Diagnosis Description E11.621 Type 2 diabetes mellitus with foot ulcer 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 M86.672 Other chronic  osteomyelitis, left ankle and foot 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

## 2021-11-16 NOTE — Progress Notes (Signed)
GABY, WALENTA ([id number]) Visit Report for 11/16/2021 HBO Details Patient Name: Date of Service: NA [name] 11/16/2021 10:00 A M Medical Record Number: [id number] Patient Account Number: [id number] Date of Birth/Sex: Treating RN: 09-22-1955 (66 y.o. [name] Primary Care Hawraa Stambaugh: [name] Other Clinician: [name] Referring Cattleya Dobratz: Treating Marlon Vonruden/Extender: [name] Weeks in Treatment: 11 HBO Treatment Course Details Treatment Course Number: 1 Ordering Azam Gervasi: [name] T Treatments Ordered: otal 80 HBO Treatment Start Date: 09/20/2021 HBO Indication: Diabetic Ulcer(s) of the Lower Extremity Standard/Conservative Wound Care tried and failed greater than or equal to 30 days HBO Treatment Details Treatment Number: 40 Patient Type: Outpatient Chamber Type: Monoplace Chamber Serial #: [id number] Treatment Protocol: 2.5 ATA with 90 minutes oxygen, with two 5 minute air breaks Treatment Details Compression Rate Down: 2.0 psi / minute De-Compression Rate Up: 2.0 psi / minute A breaks and breathing ir Compress Tx Pressure periods Decompress Decompress Begins Reached (leave unused spaces Begins Ends blank) Chamber Pressure (ATA 1 2.5 2.5 2.5 2.5 2.5 - - 2.5 1 ) Clock Time (24 hr) 09:43 09:57 10:27 10:32 11:02 11:07 - - 11:37 11:49 Treatment Length: 126 (minutes) Treatment Segments: 4 Vital Signs Capillary Blood Glucose Reference Range: 80 - 120 mg / dl HBO Diabetic Blood Glucose Intervention Range: <131 mg/dl or >249 mg/dl Time Vitals Blood Respiratory Capillary Blood Glucose Pulse Action Type: Pulse: Temperature: Taken: Pressure: Rate: Glucose (mg/dl): Meter #: Oximetry (%) Taken: Pre 09:39 134/81 89 16 97.5 224 Post 11:55 161/86 76 16 97.3 184 Treatment Response Treatment Toleration: Well Treatment Completion Status: Treatment Completed without Adverse Event Treatment Notes Safety checklist was done before  treatment started. Additional Procedure Documentation Tissue Sevierity: Necrosis of bone Physician HBO Attestation: I certify that I supervised this HBO treatment in accordance with Medicare guidelines. A trained emergency response team is readily available per Yes hospital policies and procedures. Continue HBOT as ordered. Yes Electronic Signature(s) Signed: 11/16/2021 4:59:16 PM By: [name] MD FACS Previous Signature: 11/16/2021 4:16:50 PM Version By: [name] EMT Entered By: [name] on 11/16/2021 16:59:16 -------------------------------------------------------------------------------- HBO Safety Checklist Details Patient Name: Date of Service: Colin Loveless A. 11/16/2021 10:00 A M Medical Record Number: [id number] Patient Account Number: [id number] Date of Birth/Sex: Treating RN: Nov 03, 1955 (66 y.o. [name] Primary Care Fedrick Cefalu: [name] Other Clinician: [name] Referring Shaguana Love: Treating Karol Liendo/Extender: [name] Weeks in Treatment: 11 HBO Safety Checklist Items Safety Checklist Consent Form Signed Patient voided / foley secured and emptied When did you last eato 0700 Last dose of injectable or oral agent 0700 Ostomy pouch emptied and vented if applicable NA All implantable devices assessed, documented and approved NA Intravenous access site secured and place NA Valuables secured Linens and cotton and cotton/polyester blend (less than 51% polyester) Personal oil-based products / skin lotions / body lotions removed Wigs or hairpieces removed NA Smoking or tobacco materials removed Books / newspapers / magazines / loose paper removed Cologne, aftershave, perfume and deodorant removed Jewelry removed (may wrap wedding band) NA Make-up removed NA Hair care products removed Battery operated devices (external) removed Heating patches and chemical warmers removed Titanium eyewear removed NA Nail polish  cured greater than 10 hours NA Casting material cured greater than 10 hours NA Hearing aids removed NA Loose dentures or partials removed NA Prosthetics have been removed NA Patient demonstrates correct use of air break device (if applicable) Patient concerns have been addressed Patient grounding  bracelet on and cord attached to chamber Specifics for Inpatients (complete in addition to above) Medication sheet sent with patient NA Intravenous medications needed or due during therapy sent with patient NA Drainage tubes (e.g. nasogastric tube or chest tube secured and vented) NA Endotracheal or Tracheotomy tube secured NA Cuff deflated of air and inflated with saline NA Airway suctioned NA Electronic Signature(s) Signed: 11/16/2021 4:15:11 PM By: [name] EMT Entered By: [name] on 11/16/2021 16:15:10

## 2021-11-17 ENCOUNTER — Encounter (HOSPITAL_BASED_OUTPATIENT_CLINIC_OR_DEPARTMENT_OTHER): Payer: BC Managed Care – PPO | Admitting: General Surgery

## 2021-11-17 DIAGNOSIS — Z7982 Long term (current) use of aspirin: Secondary | ICD-10-CM | POA: Diagnosis not present

## 2021-11-17 DIAGNOSIS — Z951 Presence of aortocoronary bypass graft: Secondary | ICD-10-CM | POA: Diagnosis not present

## 2021-11-17 DIAGNOSIS — M1990 Unspecified osteoarthritis, unspecified site: Secondary | ICD-10-CM | POA: Diagnosis not present

## 2021-11-17 DIAGNOSIS — Z7984 Long term (current) use of oral hypoglycemic drugs: Secondary | ICD-10-CM | POA: Diagnosis not present

## 2021-11-17 DIAGNOSIS — D649 Anemia, unspecified: Secondary | ICD-10-CM | POA: Diagnosis not present

## 2021-11-17 DIAGNOSIS — Z452 Encounter for adjustment and management of vascular access device: Secondary | ICD-10-CM | POA: Diagnosis not present

## 2021-11-17 DIAGNOSIS — E1169 Type 2 diabetes mellitus with other specified complication: Secondary | ICD-10-CM | POA: Diagnosis not present

## 2021-11-17 DIAGNOSIS — E11610 Type 2 diabetes mellitus with diabetic neuropathic arthropathy: Secondary | ICD-10-CM | POA: Diagnosis not present

## 2021-11-17 DIAGNOSIS — L97522 Non-pressure chronic ulcer of other part of left foot with fat layer exposed: Secondary | ICD-10-CM | POA: Diagnosis not present

## 2021-11-17 DIAGNOSIS — K219 Gastro-esophageal reflux disease without esophagitis: Secondary | ICD-10-CM | POA: Diagnosis not present

## 2021-11-17 DIAGNOSIS — Z792 Long term (current) use of antibiotics: Secondary | ICD-10-CM | POA: Diagnosis not present

## 2021-11-17 DIAGNOSIS — E1140 Type 2 diabetes mellitus with diabetic neuropathy, unspecified: Secondary | ICD-10-CM | POA: Diagnosis not present

## 2021-11-17 DIAGNOSIS — T8131XA Disruption of external operation (surgical) wound, not elsewhere classified, initial encounter: Secondary | ICD-10-CM | POA: Diagnosis not present

## 2021-11-17 DIAGNOSIS — Z794 Long term (current) use of insulin: Secondary | ICD-10-CM | POA: Diagnosis not present

## 2021-11-17 DIAGNOSIS — M86672 Other chronic osteomyelitis, left ankle and foot: Secondary | ICD-10-CM | POA: Diagnosis not present

## 2021-11-17 DIAGNOSIS — I252 Old myocardial infarction: Secondary | ICD-10-CM | POA: Diagnosis not present

## 2021-11-17 DIAGNOSIS — B372 Candidiasis of skin and nail: Secondary | ICD-10-CM | POA: Diagnosis not present

## 2021-11-17 DIAGNOSIS — E11621 Type 2 diabetes mellitus with foot ulcer: Secondary | ICD-10-CM | POA: Diagnosis not present

## 2021-11-17 DIAGNOSIS — E785 Hyperlipidemia, unspecified: Secondary | ICD-10-CM | POA: Diagnosis not present

## 2021-11-17 DIAGNOSIS — I2510 Atherosclerotic heart disease of native coronary artery without angina pectoris: Secondary | ICD-10-CM | POA: Diagnosis not present

## 2021-11-17 DIAGNOSIS — L97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: Secondary | ICD-10-CM | POA: Diagnosis not present

## 2021-11-17 DIAGNOSIS — E11622 Type 2 diabetes mellitus with other skin ulcer: Secondary | ICD-10-CM | POA: Diagnosis not present

## 2021-11-17 DIAGNOSIS — I10 Essential (primary) hypertension: Secondary | ICD-10-CM | POA: Diagnosis not present

## 2021-11-17 LAB — GLUCOSE, CAPILLARY
Glucose-Capillary: 132 mg/dL — ABNORMAL HIGH (ref 70–99)
Glucose-Capillary: 153 mg/dL — ABNORMAL HIGH (ref 70–99)

## 2021-11-17 NOTE — Progress Notes (Signed)
Colin, [name] ([id number]) Visit Report for 11/17/2021 Arrival Information Details Patient Name: Date of Service: NA [name] 11/17/2021 10:00 A M Medical Record Number: [id number] Patient Account Number: [id number] Date of Birth/Sex: Treating RN: August 22, 1955 (66 y.o. [name] Primary Care [name]: [name] Other Clinician: [name] Referring [name]: Treating [name]/Extender: [name] in Treatment: 12 Visit Information History Since Last Visit All ordered tests and consults were completed: Yes Patient Arrived: Knee Scooter Added or deleted any medications: No Arrival Time: 09:29 Any new allergies or adverse reactions: No Accompanied By: Wife Had a fall or experienced change in No Transfer Assistance: None activities of daily living that may affect Patient Identification Verified: Yes risk of falls: Secondary Verification Process Completed: Yes Signs or symptoms of abuse/neglect since last visito No Patient Requires Transmission-Based Precautions: No Hospitalized since last visit: No Patient Has Alerts: Yes Implantable device outside of the clinic excluding No Patient Alerts: Patient on Blood Thinner cellular tissue based products placed in the center since last visit: Pain Present Now: No Electronic Signature(s) Signed: 11/17/2021 4:11:21 PM By: [name] EMT Entered By: [name] on 11/17/2021 16:11:21 -------------------------------------------------------------------------------- Encounter Discharge Information Details Patient Name: Date of Service: NA Colin [name] 11/17/2021 10:00 A M Medical Record Number: [id number] Patient Account Number: [id number] Date of Birth/Sex: Treating RN: 02/21/1956 (66 y.o. [name] Primary Care [name]: [name] Other Clinician: [name] Referring [name]: Treating [name]/Extender: [name] in Treatment: 12 Encounter Discharge  Information Items Discharge Condition: Stable Ambulatory Status: Knee Scooter Discharge Destination: Home Transportation: Ambulance Accompanied By: Wife Schedule Follow-up Appointment: Yes Clinical Summary of Care: Electronic Signature(s) Signed: 11/17/2021 4:16:56 PM By: [name] EMT Entered By: [name] on 11/17/2021 16:16:56 -------------------------------------------------------------------------------- Vitals Details Patient Name: Date of Service: NA Colin [name] 11/17/2021 10:00 A M Medical Record Number: [id number] Patient Account Number: [id number] Date of Birth/Sex: Treating RN: January 15, 1956 (66 y.o. [name] Primary Care [name]: [name] Other Clinician: [name] Referring [name]: Treating [name]/Extender: [name] Weeks in Treatment: 12 Vital Signs Time Taken: 09:50 Temperature (F): 97.5 Height (in): 74 Pulse (bpm): 72 Weight (lbs): 186 Respiratory Rate (breaths/min): 16 Body Mass Index (BMI): 23.9 Blood Pressure (mmHg): 138/84 Capillary Blood Glucose (mg/dl): 185 Reference Range: 80 - 120 mg / dl Electronic Signature(s) Signed: 11/17/2021 4:12:12 PM By: [name] EMT Entered By: [name] on 11/17/2021 16:12:12

## 2021-11-17 NOTE — Progress Notes (Signed)
[name], [name] ([id number]) Visit Report for 11/17/2021 Problem List Details Patient Name: Date of Service: NA [name] 11/17/2021 10:00 A M Medical Record Number: [id number] Patient Account Number: [id number] Date of Birth/Sex: Treating RN: February 19, 1956 (66 y.o. 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 11/17/2021 Medical Record Number: [id number] Patient Account Number: [id number] Date of Birth/Sex: Treating RN: 1955-09-15 (66 y.o. 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 Facility Procedures CPT4 Code: 83729021 Description: G0277-(Facility Use Only) HBOT full body chamber, , ICD-10 Diagnosis Description E11.621 Type 2 diabetes mellitus with foot ulcer 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 M86.672 Other chronic  osteomyelitis, left ankle and foot 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

## 2021-11-17 NOTE — Progress Notes (Signed)
[name] ([id number]) Visit Report for 11/17/2021 HBO Details Patient Name: Date of Service: NA [name] 11/17/2021 10:00 A M Medical Record Number: [id number] Patient Account Number: [id number] Date of Birth/Sex: Treating RN: 08-05-1955 (66 y.o. [name] Primary Care [name]: [name] Other Clinician: [name] Referring [name]: Treating [name]/Extender: [name]: 12 HBO [name] Course Details [name] Course Number: 1 Ordering [name]: [name] T Treatments Ordered: otal 80 HBO [name] Start Date: 09/20/2021 HBO Indication: Diabetic Ulcer(s) of the Lower Extremity Standard/Conservative Wound Care tried and failed greater than or equal to 30 days HBO [name] Details [name] Number: 41 Patient Type: Outpatient Chamber Type: Monoplace Chamber Serial #: [id number] [name] Protocol: 2.5 ATA with 90 minutes oxygen, with two 5 minute air breaks [name] Details Compression Rate Down: 2.0 psi / minute De-Compression Rate Up: 2.0 psi / minute A breaks and breathing ir Compress Tx Pressure periods Decompress Decompress Begins Reached (leave unused spaces Begins Ends blank) Chamber Pressure (ATA 1 2.5 2.5 2.5 2.5 2.5 - - 2.5 1 ) Clock Time (24 hr) 09:58 10:08 10:38 10:43 11:13 11:18 - - 11:48 12:00 [name] Length: 122 (minutes) [name] Segments: 4 Vital Signs Capillary Blood Glucose Reference Range: 80 - 120 mg / dl HBO Diabetic Blood Glucose Intervention Range: <131 mg/dl or >357 mg/dl Time Vitals Blood Respiratory Capillary Blood Glucose Pulse Action Type: Pulse: Temperature: Taken: Pressure: Rate: Glucose (mg/dl): Meter #: Oximetry (%) Taken: Pre 09:50 138/84 72 16 97.5 132 Post 12:03 170/92 68 16 97.6 153 [name] Response [name] Toleration: Well [name] Completion Status: [name] Completed without Adverse Event Additional Procedure Documentation Tissue Sevierity:  Necrosis of bone Physician HBO Attestation: I certify that I supervised this HBO [name] in accordance with Medicare guidelines. A trained emergency response team is readily available per Yes hospital policies and procedures. Continue HBOT as ordered. Yes Electronic Signature(s) Signed: 11/17/2021 4:35:46 PM By: Colin Guess MD FACS Previous Signature: 11/17/2021 4:15:23 PM Version By: [name] EMT Entered By: [name] on 11/17/2021 16:35:45 -------------------------------------------------------------------------------- HBO Safety Checklist Details Patient Name: Date of Service: NA Colin Nephew A. 11/17/2021 10:00 A M Medical Record Number: [id number] Patient Account Number: [id number] Date of Birth/Sex: Treating RN: December 07, 1955 (66 y.o. [name] Primary Care [name]: [name] Other Clinician: [name] Referring Jessamy Torosyan: Treating Aicha Clingenpeel/Extender: [name]: 12 HBO Safety Checklist Items Safety Checklist Consent Form Signed Patient voided / foley secured and emptied When did you last eato 0745 Last dose of injectable or oral agent 0745 Ostomy pouch emptied and vented if applicable NA All implantable devices assessed, documented and approved NA Intravenous access site secured and place NA Valuables secured Linens and cotton and cotton/polyester blend (less than 51% polyester) Personal oil-based products / skin lotions / body lotions removed Wigs or hairpieces removed NA Smoking or tobacco materials removed Books / newspapers / magazines / loose paper removed Cologne, aftershave, perfume and deodorant removed Jewelry removed (may wrap wedding band) NA Make-up removed NA Hair care products removed Battery operated devices (external) removed Heating patches and chemical warmers removed Titanium eyewear removed NA Nail polish cured greater than 10 hours NA Casting material cured greater than 10  hours NA Hearing aids removed NA Loose dentures or partials removed NA Prosthetics have been removed NA Patient demonstrates correct use of air break device (if applicable) Patient concerns have been addressed Patient grounding bracelet on and cord attached to chamber Specifics for  Inpatients (complete in addition to above) Medication sheet sent with patient NA Intravenous medications needed or due during therapy sent with patient NA Drainage tubes (e.g. nasogastric tube or chest tube secured and vented) NA Endotracheal or Tracheotomy tube secured NA Cuff deflated of air and inflated with saline NA Airway suctioned NA Electronic Signature(s) Signed: 11/17/2021 4:13:20 PM By: [name] EMT Entered By: [name] on 11/17/2021 16:13:19

## 2021-11-18 ENCOUNTER — Encounter (HOSPITAL_BASED_OUTPATIENT_CLINIC_OR_DEPARTMENT_OTHER): Payer: BC Managed Care – PPO | Admitting: General Surgery

## 2021-11-18 DIAGNOSIS — E11622 Type 2 diabetes mellitus with other skin ulcer: Secondary | ICD-10-CM | POA: Diagnosis not present

## 2021-11-18 DIAGNOSIS — T8131XA Disruption of external operation (surgical) wound, not elsewhere classified, initial encounter: Secondary | ICD-10-CM | POA: Diagnosis not present

## 2021-11-18 DIAGNOSIS — M86672 Other chronic osteomyelitis, left ankle and foot: Secondary | ICD-10-CM | POA: Diagnosis not present

## 2021-11-18 DIAGNOSIS — Z951 Presence of aortocoronary bypass graft: Secondary | ICD-10-CM | POA: Diagnosis not present

## 2021-11-18 DIAGNOSIS — I2510 Atherosclerotic heart disease of native coronary artery without angina pectoris: Secondary | ICD-10-CM | POA: Diagnosis not present

## 2021-11-18 DIAGNOSIS — E11610 Type 2 diabetes mellitus with diabetic neuropathic arthropathy: Secondary | ICD-10-CM | POA: Diagnosis not present

## 2021-11-18 DIAGNOSIS — L97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: Secondary | ICD-10-CM | POA: Diagnosis not present

## 2021-11-18 DIAGNOSIS — E1169 Type 2 diabetes mellitus with other specified complication: Secondary | ICD-10-CM | POA: Diagnosis not present

## 2021-11-18 DIAGNOSIS — B372 Candidiasis of skin and nail: Secondary | ICD-10-CM | POA: Diagnosis not present

## 2021-11-18 LAB — GLUCOSE, CAPILLARY
Glucose-Capillary: 212 mg/dL — ABNORMAL HIGH (ref 70–99)
Glucose-Capillary: 164 mg/dL — ABNORMAL HIGH (ref 70–99)

## 2021-11-18 NOTE — Progress Notes (Signed)
KWASI, JOUNG ([id number]) Visit Report for 11/18/2021 Arrival Information Details Patient Name: Date of Service: NA [name] 11/18/2021 10:00 A M Medical Record Number: [id number] Patient Account Number: [id number] Date of Birth/Sex: Treating RN: [birth year] (66 y.o. [name] Primary Care Semiah Konczal: [name] Other Clinician: [name] Referring Mikena Masoner: Treating Abbygael Curtiss/Extender: [name] in Treatment: 12 Visit Information History Since Last Visit All ordered tests and consults were completed: Yes Patient Arrived: Knee Scooter Added or deleted any medications: No Arrival Time: 09:08 Any new allergies or adverse reactions: No Accompanied By: Wife Had a fall or experienced change in No Transfer Assistance: None activities of daily living that may affect Patient Identification Verified: Yes risk of falls: Secondary Verification Process Completed: Yes Signs or symptoms of abuse/neglect since last visito No Patient Requires Transmission-Based Precautions: No Hospitalized since last visit: No Patient Has Alerts: Yes Implantable device outside of the clinic excluding No Patient Alerts: Patient on Blood Thinner cellular tissue based products placed in the center since last visit: Pain Present Now: No Electronic Signature(s) Signed: 11/18/2021 2:38:18 PM By: [name] EMT Entered By: [name] on 11/18/2021 14:38:17 -------------------------------------------------------------------------------- Encounter Discharge Information Details Patient Name: Date of Service: NA Colin [name] 11/18/2021 10:00 A M Medical Record Number: [id number] Patient Account Number: [id number] Date of Birth/Sex: Treating RN: 30-Aug-1955 (66 y.o. [name] Primary Care Tonilynn Bieker: [name] Other Clinician: [name] Referring Raeven Pint: Treating Zaynab Chipman/Extender: [name] in Treatment: 12 Encounter Discharge  Information Items Discharge Condition: Stable Ambulatory Status: Knee Scooter Discharge Destination: Home Transportation: Private Auto Accompanied By: Wife Schedule Follow-up Appointment: Yes Clinical Summary of Care: Electronic Signature(s) Signed: 11/18/2021 2:44:04 PM By: [name] EMT Entered By: [name] on 11/18/2021 14:44:04 -------------------------------------------------------------------------------- Vitals Details Patient Name: Date of Service: NA Colin [name] 11/18/2021 10:00 A M Medical Record Number: [id number] Patient Account Number: [id number] Date of Birth/Sex: Treating RN: 17-Apr-1956 (66 y.o. [name] Primary Care Yaron Grasse: [name] Other Clinician: [name] Referring Kaybree Williams: Treating Narcisa Ganesh/Extender: [name]: 12 Vital Signs Time Taken: 09:30 Temperature (F): 97.5 Height (in): 74 Pulse (bpm): 71 Weight (lbs): 186 Respiratory Rate (breaths/min): 16 Body Mass Index (BMI): 23.9 Blood Pressure (mmHg): 128/82 Capillary Blood Glucose (mg/dl): 595 Reference Range: 80 - 120 mg / dl Electronic Signature(s) Signed: 11/18/2021 2:38:49 PM By: [name] EMT Entered By: [name] on 11/18/2021 14:38:49

## 2021-11-18 NOTE — Progress Notes (Addendum)
[name], [name] ([id number]) Visit Report for 11/18/2021 HBO Details Patient Name: Date of Service: NA Colin Nephew A. 11/18/2021 10:00 A M Medical Record Number: [id number] Patient Account Number: [id number] Date of Birth/Sex: Treating RN: November 20, 1955 (67 y.o. [name] Primary Care [name]: [name] Other Clinician: [name] Referring [name]: Treating [name]/Extender: [name] Weeks in Treatment: 12 HBO Treatment Course Details Treatment Course Number: 1 Ordering [name]: [name] T Treatments Ordered: otal 80 HBO Treatment Start Date: 09/20/2021 HBO Indication: Diabetic Ulcer(s) of the Lower Extremity Standard/Conservative Wound Care tried and failed greater than or equal to 30 days HBO Treatment Details Treatment Number: 42 Patient Type: Outpatient Chamber Type: Monoplace Chamber Serial #: [id number] Treatment Protocol: 2.5 ATA with 90 minutes oxygen, with two 5 minute air breaks Treatment Details Compression Rate Down: 2.0 psi / minute De-Compression Rate Up: 2.0 psi / minute A breaks and breathing ir Compress Tx Pressure periods Decompress Decompress Begins Reached (leave unused spaces Begins Ends blank) Chamber Pressure (ATA 1 2.5 2.5 2.5 2.5 2.5 - - 2.5 1 ) Clock Time (24 hr) 09:36 09:49 10:19 20:14 10:54 10:59 - - 11:29 11:40 Treatment Length: 124 (minutes) Treatment Segments: 4 Vital Signs Capillary Blood Glucose Reference Range: 80 - 120 mg / dl HBO Diabetic Blood Glucose Intervention Range: <131 mg/dl or >672 mg/dl Time Vitals Blood Respiratory Capillary Blood Glucose Pulse Action Type: Pulse: Temperature: Taken: Pressure: Rate: Glucose (mg/dl): Meter #: Oximetry (%) Taken: Pre 09:30 128/82 71 16 97.5 212 Post 11:44 162/82 67 16 97.5 164 Treatment Response Treatment Toleration: Well Treatment Completion Status: Treatment Completed without Adverse Event Additional Procedure Documentation Tissue Sevierity:  Necrosis of bone Physician HBO Attestation: I certify that I supervised this HBO treatment in accordance with Medicare guidelines. A trained emergency response team is readily available per Yes hospital policies and procedures. Continue HBOT as ordered. Yes Electronic Signature(s) Signed: 11/18/2021 3:55:17 PM By: Colin Guess MD FACS Previous Signature: 11/18/2021 2:41:53 PM Version By: [name] EMT Entered By: [name] on 11/18/2021 15:55:17 -------------------------------------------------------------------------------- HBO Safety Checklist Details Patient Name: Date of Service: Colin Fanning A. 11/18/2021 10:00 A M Medical Record Number: [id number] Patient Account Number: [id number] Date of Birth/Sex: Treating RN: Jan 14, 1956 (66 y.o. [name] Primary Care [name]: [name] Other Clinician: [name] Referring [name]: Treating [name]/Extender: [name] Weeks in Treatment: 12 HBO Safety Checklist Items Safety Checklist Consent Form Signed Patient voided / foley secured and emptied When did you last eato 0645 Last dose of injectable or oral agent 0700 Ostomy pouch emptied and vented if applicable NA All implantable devices assessed, documented and approved NA Intravenous access site secured and place NA Valuables secured Linens and cotton and cotton/polyester blend (less than 51% polyester) Personal oil-based products / skin lotions / body lotions removed Wigs or hairpieces removed NA Smoking or tobacco materials removed Books / newspapers / magazines / loose paper removed Cologne, aftershave, perfume and deodorant removed Jewelry removed (may wrap wedding band) NA Make-up removed NA Hair care products removed Battery operated devices (external) removed Heating patches and chemical warmers removed Titanium eyewear removed NA Nail polish cured greater than 10 hours NA Casting material cured greater than 10  hours NA Hearing aids removed NA Loose dentures or partials removed NA Prosthetics have been removed NA Patient demonstrates correct use of air break device (if applicable) Patient concerns have been addressed Patient grounding bracelet on and cord attached to chamber Specifics for  Inpatients (complete in addition to above) Medication sheet sent with patient NA Intravenous medications needed or due during therapy sent with patient NA Drainage tubes (e.g. nasogastric tube or chest tube secured and vented) NA Endotracheal or Tracheotomy tube secured NA Cuff deflated of air and inflated with saline NA Airway suctioned NA Notes The safety checklist was done before treatment started. Electronic Signature(s) Signed: 11/18/2021 2:40:26 PM By: [name] EMT Entered By: [name] on 11/18/2021 14:40:26

## 2021-11-18 NOTE — Progress Notes (Signed)
[name], [name] ([id number]) Visit Report for 11/18/2021 Problem List Details Patient Name: Date of Service: NA [name] 11/18/2021 10:00 A M Medical Record Number: [id number] Patient Account Number: [id number] Date of Birth/Sex: Treating RN: 1956-04-24 (66 y.o. 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 11/18/2021 Medical Record Number: [id number] Patient Account Number: [id number] Date of Birth/Sex: Treating RN: 02-May-1956 (66 y.o. 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 Facility Procedures CPT4 Code: 16010932 Description: G0277-(Facility Use Only) HBOT full body chamber, , ICD-10 Diagnosis Description E11.621 Type 2 diabetes mellitus with foot ulcer 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 M86.672 Other chronic  osteomyelitis, left ankle and foot 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

## 2021-11-19 ENCOUNTER — Encounter (HOSPITAL_BASED_OUTPATIENT_CLINIC_OR_DEPARTMENT_OTHER): Payer: BC Managed Care – PPO | Admitting: General Surgery

## 2021-11-19 DIAGNOSIS — I2510 Atherosclerotic heart disease of native coronary artery without angina pectoris: Secondary | ICD-10-CM | POA: Diagnosis not present

## 2021-11-19 DIAGNOSIS — Z951 Presence of aortocoronary bypass graft: Secondary | ICD-10-CM | POA: Diagnosis not present

## 2021-11-19 DIAGNOSIS — L97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: Secondary | ICD-10-CM | POA: Diagnosis not present

## 2021-11-19 DIAGNOSIS — M86672 Other chronic osteomyelitis, left ankle and foot: Secondary | ICD-10-CM | POA: Diagnosis not present

## 2021-11-19 DIAGNOSIS — E1169 Type 2 diabetes mellitus with other specified complication: Secondary | ICD-10-CM | POA: Diagnosis not present

## 2021-11-19 DIAGNOSIS — T8131XA Disruption of external operation (surgical) wound, not elsewhere classified, initial encounter: Secondary | ICD-10-CM | POA: Diagnosis not present

## 2021-11-19 DIAGNOSIS — B372 Candidiasis of skin and nail: Secondary | ICD-10-CM | POA: Diagnosis not present

## 2021-11-19 DIAGNOSIS — E11610 Type 2 diabetes mellitus with diabetic neuropathic arthropathy: Secondary | ICD-10-CM | POA: Diagnosis not present

## 2021-11-19 DIAGNOSIS — E11622 Type 2 diabetes mellitus with other skin ulcer: Secondary | ICD-10-CM | POA: Diagnosis not present

## 2021-11-19 LAB — GLUCOSE, CAPILLARY
Glucose-Capillary: 151 mg/dL — ABNORMAL HIGH (ref 70–99)
Glucose-Capillary: 138 mg/dL — ABNORMAL HIGH (ref 70–99)

## 2021-11-19 NOTE — Progress Notes (Addendum)
HENDERSON, FRAMPTON ([id number]) Visit Report for 11/19/2021 Arrival Information Details Patient Name: Date of Service: NA [name] 11/19/2021 10:00 A M Medical Record Number: [id number] Patient Account Number: [id number] Date of Birth/Sex: Treating RN: [birth year] (66 y.o. [name] Primary Care Dev Dhondt: [name] Other Clinician: [name] Referring Aanika Defoor: Treating Voyd Groft/Extender: [name] in Treatment: 12 Visit Information History Since Last Visit All ordered tests and consults were completed: Yes Patient Arrived: Knee Scooter Added or deleted any medications: No Arrival Time: 09:19 Any new allergies or adverse reactions: No Accompanied By: Wife Had a fall or experienced change in No Transfer Assistance: None activities of daily living that may affect Patient Identification Verified: Yes risk of falls: Secondary Verification Process Completed: Yes Signs or symptoms of abuse/neglect since last visito No Patient Requires Transmission-Based Precautions: No Hospitalized since last visit: No Patient Has Alerts: Yes Implantable device outside of the clinic excluding No Patient Alerts: Patient on Blood Thinner cellular tissue based products placed in the center since last visit: Pain Present Now: No Electronic Signature(s) Signed: 11/19/2021 11:33:17 AM By: [name] EMT Entered By: [name] on 11/19/2021 11:33:17 -------------------------------------------------------------------------------- Encounter Discharge Information Details Patient Name: Date of Service: [name] 11/19/2021 10:00 A M Medical Record Number: [id number] Patient Account Number: [id number] Date of Birth/Sex: Treating RN: 04-16-1956 (66 y.o. [name] Primary Care Tabbetha Kutscher: [name] Other Clinician: [name] Referring Shelvy Perazzo: Treating Frankey Botting/Extender: [name] in Treatment: 12 Encounter  Discharge Information Items Discharge Condition: Stable Ambulatory Status: Knee Scooter Discharge Destination: Home Transportation: Private Auto Accompanied By: None Schedule Follow-up Appointment: Yes Clinical Summary of Care: Electronic Signature(s) Signed: 11/19/2021 12:04:52 PM By: [name] EMT Entered By: [name] on 11/19/2021 12:04:52 -------------------------------------------------------------------------------- Vitals Details Patient Name: Date of Service: [name] 11/19/2021 10:00 A M Medical Record Number: [id number] Patient Account Number: [id number] Date of Birth/Sex: Treating RN: [birth year] (66 y.o. [name] Primary Care Latice Waitman: [name] Other Clinician: [name] Referring Marjoria Mancillas: Treating Jacarra Bobak/Extender: [name]: 12 Vital Signs Time Taken: 09:39 Temperature (F): 97.5 Height (in): 74 Pulse (bpm): 81 Weight (lbs): 186 Respiratory Rate (breaths/min): 16 Body Mass Index (BMI): 23.9 Blood Pressure (mmHg): 125/71 Capillary Blood Glucose (mg/dl): 852 Reference Range: 80 - 120 mg / dl Electronic Signature(s) Signed: 11/19/2021 11:34:01 AM By: [name] EMT Entered By: [name] on 11/19/2021 11:34:01

## 2021-11-19 NOTE — Progress Notes (Signed)
TOA, MIA ([id number]) Visit Report for 11/19/2021 Problem List Details Patient Name: Date of Service: NA Colin Nephew A. 11/19/2021 10:00 A M Medical Record Number: [id number] Patient Account Number: [id number] Date of Birth/Sex: Treating RN: 1956-04-01 (66 y.o. 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 11/19/2021 Medical Record Number: [id number] Patient Account Number: [id number] Date of Birth/Sex: Treating RN: 1955-12-20 (67 y.o. 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 Facility Procedures CPT4 Code: [id number] Description: G0277-(Facility Use Only) HBOT full body chamber, , ICD-10 Diagnosis Description E11.621 Type 2 diabetes mellitus with foot ulcer 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 M86.672 Other chronic  osteomyelitis, left ankle and foot 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

## 2021-11-19 NOTE — Progress Notes (Addendum)
CHET, GREENLEY ([id number]) Visit Report for 11/19/2021 HBO Details Patient Name: Date of Service: NA [name] 11/19/2021 10:00 A M Medical Record Number: [id number] Patient Account Number: [id number] Date of Birth/Sex: Treating RN: 08-26-1955 (66 y.o. [name] Primary Care [name]: [name] Other Clinician: [name] Referring [name]: Treating [name]/Extender: [name] Weeks in Treatment: 12 HBO Treatment Course Details Treatment Course Number: 1 Ordering [name]: [name] T Treatments Ordered: otal 80 HBO Treatment Start Date: 09/20/2021 HBO Indication: Diabetic Ulcer(s) of the Lower Extremity Standard/Conservative Wound Care tried and failed greater than or equal to 30 days HBO Treatment Details Treatment Number: 43 Patient Type: Outpatient Chamber Type: Monoplace Chamber Serial #: [id number] Treatment Protocol: 2.5 ATA with 90 minutes oxygen, with two 5 minute air breaks Treatment Details Compression Rate Down: 2.0 psi / minute De-Compression Rate Up: 2.0 psi / minute A breaks and breathing ir Compress Tx Pressure periods Decompress Decompress Begins Reached (leave unused spaces Begins Ends blank) Chamber Pressure (ATA 1 2.5 2.5 2.5 2.5 2.5 - - 2.5 1 ) Clock Time (24 hr) 09:43 09:56 10:26 10:31 11:01 11:06 - - 11:36 11:48 Treatment Length: 125 (minutes) Treatment Segments: 4 Vital Signs Capillary Blood Glucose Reference Range: 80 - 120 mg / dl HBO Diabetic Blood Glucose Intervention Range: <131 mg/dl or >062 mg/dl Time Vitals Blood Respiratory Capillary Blood Glucose Pulse Action Type: Pulse: Temperature: Taken: Pressure: Rate: Glucose (mg/dl): Meter #: Oximetry (%) Taken: Pre 09:39 125/71 81 16 97.5 151 Post 11:52 144/84 70 16 97.5 138 Treatment Response Treatment Toleration: Well Treatment Completion Status: Treatment Completed without Adverse Event Additional Procedure Documentation Tissue Sevierity:  Necrosis of bone Physician HBO Attestation: I certify that I supervised this HBO treatment in accordance with Medicare guidelines. A trained emergency response team is readily available per Yes hospital policies and procedures. Continue HBOT as ordered. Yes Electronic Signature(s) Signed: 11/19/2021 12:30:56 PM By: Colin Guess MD FACS Previous Signature: 11/19/2021 11:55:04 AM Version By: [name] EMT Entered By: [name] on 11/19/2021 12:30:55 -------------------------------------------------------------------------------- HBO Safety Checklist Details Patient Name: Date of Service: Colin Fanning A. 11/19/2021 10:00 A M Medical Record Number: [id number] Patient Account Number: [id number] Date of Birth/Sex: Treating RN: Jun 01, 1956 (66 y.o. [name] Primary Care [name]: [name] Other Clinician: [name] Referring [name]: Treating [name]/Extender: [name] Weeks in Treatment: 12 HBO Safety Checklist Items Safety Checklist Consent Form Signed Patient voided / foley secured and emptied When did you last eato 0700 Last dose of injectable or oral agent 0700 Ostomy pouch emptied and vented if applicable NA All implantable devices assessed, documented and approved NA Intravenous access site secured and place NA Valuables secured Linens and cotton and cotton/polyester blend (less than 51% polyester) Personal oil-based products / skin lotions / body lotions removed Wigs or hairpieces removed NA Smoking or tobacco materials removed Books / newspapers / magazines / loose paper removed Cologne, aftershave, perfume and deodorant removed Jewelry removed (may wrap wedding band) NA Make-up removed Hair care products removed Battery operated devices (external) removed Heating patches and chemical warmers removed Titanium eyewear removed NA Nail polish cured greater than 10 hours NA Casting material cured greater than 10  hours NA Hearing aids removed NA Loose dentures or partials removed NA Prosthetics have been removed NA Patient demonstrates correct use of air break device (if applicable) Patient concerns have been addressed Patient grounding bracelet on and cord attached to chamber Specifics for Inpatients (  complete in addition to above) Medication sheet sent with patient NA Intravenous medications needed or due during therapy sent with patient NA Drainage tubes (e.g. nasogastric tube or chest tube secured and vented) NA Endotracheal or Tracheotomy tube secured NA Cuff deflated of air and inflated with saline NA Airway suctioned NA Electronic Signature(s) Signed: 11/19/2021 11:41:37 AM By: [name] EMT Entered By: [name] on 11/19/2021 11:41:37

## 2021-11-20 DIAGNOSIS — T8131XA Disruption of external operation (surgical) wound, not elsewhere classified, initial encounter: Secondary | ICD-10-CM | POA: Diagnosis not present

## 2021-11-21 DIAGNOSIS — T8131XA Disruption of external operation (surgical) wound, not elsewhere classified, initial encounter: Secondary | ICD-10-CM | POA: Diagnosis not present

## 2021-11-22 ENCOUNTER — Encounter (HOSPITAL_BASED_OUTPATIENT_CLINIC_OR_DEPARTMENT_OTHER): Payer: BC Managed Care – PPO | Admitting: General Surgery

## 2021-11-22 DIAGNOSIS — E11610 Type 2 diabetes mellitus with diabetic neuropathic arthropathy: Secondary | ICD-10-CM | POA: Diagnosis not present

## 2021-11-22 DIAGNOSIS — E1169 Type 2 diabetes mellitus with other specified complication: Secondary | ICD-10-CM | POA: Diagnosis not present

## 2021-11-22 DIAGNOSIS — K219 Gastro-esophageal reflux disease without esophagitis: Secondary | ICD-10-CM | POA: Diagnosis not present

## 2021-11-22 DIAGNOSIS — L97324 Non-pressure chronic ulcer of left ankle with necrosis of bone: Secondary | ICD-10-CM | POA: Diagnosis not present

## 2021-11-22 DIAGNOSIS — Z7984 Long term (current) use of oral hypoglycemic drugs: Secondary | ICD-10-CM | POA: Diagnosis not present

## 2021-11-22 DIAGNOSIS — T8131XA Disruption of external operation (surgical) wound, not elsewhere classified, initial encounter: Secondary | ICD-10-CM | POA: Diagnosis not present

## 2021-11-22 DIAGNOSIS — Z794 Long term (current) use of insulin: Secondary | ICD-10-CM | POA: Diagnosis not present

## 2021-11-22 DIAGNOSIS — E785 Hyperlipidemia, unspecified: Secondary | ICD-10-CM | POA: Diagnosis not present

## 2021-11-22 DIAGNOSIS — Z9181 History of falling: Secondary | ICD-10-CM | POA: Diagnosis not present

## 2021-11-22 DIAGNOSIS — D649 Anemia, unspecified: Secondary | ICD-10-CM | POA: Diagnosis not present

## 2021-11-22 DIAGNOSIS — Z951 Presence of aortocoronary bypass graft: Secondary | ICD-10-CM | POA: Diagnosis not present

## 2021-11-22 DIAGNOSIS — I252 Old myocardial infarction: Secondary | ICD-10-CM | POA: Diagnosis not present

## 2021-11-22 DIAGNOSIS — L97322 Non-pressure chronic ulcer of left ankle with fat layer exposed: Secondary | ICD-10-CM | POA: Diagnosis not present

## 2021-11-22 DIAGNOSIS — I2510 Atherosclerotic heart disease of native coronary artery without angina pectoris: Secondary | ICD-10-CM | POA: Diagnosis not present

## 2021-11-22 DIAGNOSIS — I10 Essential (primary) hypertension: Secondary | ICD-10-CM | POA: Diagnosis not present

## 2021-11-22 DIAGNOSIS — M86672 Other chronic osteomyelitis, left ankle and foot: Secondary | ICD-10-CM | POA: Diagnosis not present

## 2021-11-22 DIAGNOSIS — E11622 Type 2 diabetes mellitus with other skin ulcer: Secondary | ICD-10-CM | POA: Diagnosis not present

## 2021-11-22 DIAGNOSIS — M1990 Unspecified osteoarthritis, unspecified site: Secondary | ICD-10-CM | POA: Diagnosis not present

## 2021-11-22 DIAGNOSIS — Z7982 Long term (current) use of aspirin: Secondary | ICD-10-CM | POA: Diagnosis not present

## 2021-11-22 DIAGNOSIS — B372 Candidiasis of skin and nail: Secondary | ICD-10-CM | POA: Diagnosis not present

## 2021-11-22 DIAGNOSIS — E1140 Type 2 diabetes mellitus with diabetic neuropathy, unspecified: Secondary | ICD-10-CM | POA: Diagnosis not present

## 2021-11-22 LAB — GLUCOSE, CAPILLARY
Glucose-Capillary: 119 mg/dL — ABNORMAL HIGH (ref 70–99)
Glucose-Capillary: 139 mg/dL — ABNORMAL HIGH (ref 70–99)
Glucose-Capillary: 143 mg/dL — ABNORMAL HIGH (ref 70–99)

## 2021-11-22 NOTE — Progress Notes (Addendum)
Colin, [name] ([id number]) Visit Report for 11/22/2021 HBO Details Patient Name: Date of Service: NA [name] [name]. 11/22/2021 10:00 [name] Medical Record Number: [id number] Patient Account Number: [id number] Date of Birth/Sex: Treating RN: [birth year] (66 y.o. [name] Primary Care [name]: [name] Other Clinician: [name] Referring [name]: Treating [name]/Extender: [name] Weeks in Treatment: 12 HBO Treatment Course Details Treatment Course Number: 1 Ordering [name] Surgeon: [name] T Treatments Ordered: otal 80 HBO Treatment Start Date: 09/20/2021 HBO Indication: Diabetic Ulcer(s) of the Lower Extremity Standard/Conservative Wound Care tried and failed greater than or equal to 30 days HBO Treatment Details Treatment Number: 44 Patient Type: Outpatient Chamber Type: Monoplace Chamber Serial #: [id number] Treatment Protocol: 2.5 ATA with 90 minutes oxygen, with two 5 minute air breaks Treatment Details Compression Rate Down: 2.0 psi / minute De-Compression Rate Up: 2.0 psi / minute [name] breaks and breathing ir Compress Tx Pressure periods Decompress Decompress Begins Reached (leave unused spaces Begins Ends blank) Chamber Pressure (ATA 1 2.5 2.5 2.5 2.5 2.5 - - 2.5 1 ) Clock Time (24 hr) 10:08 10:21 10:51 10:56 11:26 11:31 - - 12:01 12:13 Treatment Length: 125 (minutes) Treatment Segments: 4 Vital Signs Capillary Blood Glucose Reference Range: 80 - 120 mg / dl HBO Diabetic Blood Glucose Intervention Range: <131 mg/dl or >858 mg/dl Type: Time Vitals Blood Pulse: Respiratory Capillary Blood Glucose Pulse Action Temperature: Taken: Pressure: Rate: Glucose (mg/dl): Meter #: Oximetry (%) Taken: Pre 10:00 136/87 69 16 97.7 Post 12:19 179/91 68 16 97.7 143 Pre 09:46 119 Patient given 8 oz Glucerna Treatment Response Treatment Completion Status: Treatment Completed without Adverse Event Additional Procedure Documentation Tissue  Sevierity: Necrosis of bone Physician HBO Attestation: I certify that I supervised this HBO treatment in accordance with Medicare guidelines. [name] trained emergency response team is readily available per Yes hospital policies and procedures. Continue HBOT as ordered. Yes Electronic Signature(s) Signed: 11/22/2021 3:23:14 PM By: Colin Guess MD FACS Previous Signature: 11/22/2021 1:29:35 PM Version By: [name] EMT Entered By: [name] on 11/22/2021 15:23:14 -------------------------------------------------------------------------------- HBO Safety Checklist Details Patient Name: Date of Service: [name]. 11/22/2021 10:00 [name] Medical Record Number: [id number] Patient Account Number: [id number] Date of Birth/Sex: Treating RN: 01/14/1956 (66 y.o. [name] Primary Care [name]: [name] Other Clinician: [name] Referring [name]: Treating [name]/Extender: [name] Weeks in Treatment: 12 HBO Safety Checklist Items Safety Checklist Consent Form Signed Patient voided / foley secured and emptied When did you last eato 0715 Last dose of injectable or oral agent 0715 Ostomy pouch emptied and vented if applicable NA All implantable devices assessed, documented and approved NA Intravenous access site secured and place NA Valuables secured Linens and cotton and cotton/polyester blend (less than 51% polyester) Personal oil-based products / skin lotions / body lotions removed Wigs or hairpieces removed NA Smoking or tobacco materials removed Books / newspapers / magazines / loose paper removed Cologne, aftershave, perfume and deodorant removed Jewelry removed (may wrap wedding band) NA Make-up removed NA Hair care products removed Battery operated devices (external) removed Heating patches and chemical warmers removed Titanium eyewear removed NA Nail polish cured greater than 10 hours NA Casting material cured  greater than 10 hours NA Hearing aids removed NA Loose dentures or partials removed NA Prosthetics have been removed NA Patient demonstrates correct use of air break device (if applicable) Patient concerns have been addressed Patient grounding bracelet on and cord attached  to chamber Specifics for Inpatients (complete in addition to above) Medication sheet sent with patient NA Intravenous medications needed or due during therapy sent with patient NA Drainage tubes (e.g. nasogastric tube or chest tube secured and vented) NA Endotracheal or Tracheotomy tube secured NA Cuff deflated of air and inflated with saline NA Airway suctioned NA Notes Safety checklist was done before treatment started Electronic Signature(s) Signed: 11/22/2021 1:26:06 PM By: [name] EMT Entered By: [name] on 11/22/2021 13:26:06

## 2021-11-22 NOTE — Progress Notes (Signed)
HAITHAM, DOLINSKY ([id number]) Visit Report for 11/22/2021 Arrival Information Details Patient Name: Date of Service: NA [name] 11/22/2021 10:00 A M Medical Record Number: [id number] Patient Account Number: [id number] Date of Birth/Sex: Treating RN: 1955-08-13 (66 y.o. [name] Primary Care Cheyene Hamric: [name] Other Clinician: [name] Referring Ignacia Gentzler: Treating Cardell Rachel/Extender: [name] in Treatment: 12 Visit Information History Since Last Visit All ordered tests and consults were completed: Yes Patient Arrived: Knee Scooter Added or deleted any medications: No Arrival Time: 09:26 Any new allergies or adverse reactions: No Accompanied By: Wife Had a fall or experienced change in No Transfer Assistance: None activities of daily living that may affect Patient Identification Verified: Yes risk of falls: Secondary Verification Process Completed: Yes Signs or symptoms of abuse/neglect since last visito No Patient Requires Transmission-Based Precautions: No Hospitalized since last visit: No Patient Has Alerts: Yes Implantable device outside of the clinic excluding No Patient Alerts: Patient on Blood Thinner cellular tissue based products placed in the center since last visit: Pain Present Now: No Electronic Signature(s) Signed: 11/22/2021 1:24:22 PM By: [name] EMT Entered By: [name] on 11/22/2021 13:24:22 -------------------------------------------------------------------------------- Vitals Details Patient Name: Date of Service: NA RRO Colin Aris [name] 11/22/2021 10:00 A M Medical Record Number: [id number] Patient Account Number: [id number] Date of Birth/Sex: Treating RN: 1955/09/27 (66 y.o. [name] Primary Care Steffen Hase: [name] Other Clinician: [name] Referring Larren Copes: Treating Lolly Glaus/Extender: [name]: 12 Vital Signs Time Taken:  10:00 Temperature (F): 97.7 Height (in): 74 Pulse (bpm): 69 Weight (lbs): 186 Respiratory Rate (breaths/min): 16 Body Mass Index (BMI): 23.9 Blood Pressure (mmHg): 136/87 Reference Range: 80 - 120 mg / dl Electronic Signature(s) Signed: 11/22/2021 1:24:53 PM By: [name] EMT Entered By: [name] on 11/22/2021 13:24:53

## 2021-11-22 NOTE — Progress Notes (Signed)
[name], Colin ([id number]) Visit Report for 11/19/2021 Arrival Information Details Patient Name: Date of Service: NA [name] 11/19/2021 12:45 PM Medical Record Number: [id number] Patient Account Number: [id number] Date of Birth/Sex: Treating RN: [birth year] (65 y.o. [name] Primary Care Tlaloc Taddei: [name] Other Clinician: Referring Rishard Delange: Treating Kaytlan Behrman/Extender: [name] in Treatment: 12 Visit Information History Since Last Visit Added or deleted any medications: No Patient Arrived: Knee Scooter Any new allergies or adverse reactions: No Arrival Time: 12:21 Had a fall or experienced change in No Accompanied By: self activities of daily living that may affect Transfer Assistance: None risk of falls: Patient Identification Verified: Yes Signs or symptoms of abuse/neglect since last visito No Secondary Verification Process Completed: Yes Hospitalized since last visit: No Patient Requires Transmission-Based Precautions: No Implantable device outside of the clinic excluding No Patient Has Alerts: Yes cellular tissue based products placed in the center Patient Alerts: Patient on Blood Thinner since last visit: Has Dressing in Place as Prescribed: Yes Pain Present Now: No Electronic Signature(s) Signed: 11/22/2021 5:17:06 PM By: Adline Peals Entered By: Adline Peals on 11/19/2021 12:23:26 -------------------------------------------------------------------------------- Encounter Discharge Information Details Patient Name: Date of Service: NA [name] A. 11/19/2021 12:45 PM Medical Record Number: [id number] Patient Account Number: [id number] Date of Birth/Sex: Treating RN: 02-09-1956 (66 y.o. [name] Primary Care Zainab Crumrine: [name] Other Clinician: Referring Machel Violante: Treating Yuvan Medinger/Extender: [name] in Treatment: 12 Encounter Discharge Information Items Post  Procedure Vitals Discharge Condition: Stable Temperature (F): 97.6 Ambulatory Status: Knee Scooter Pulse (bpm): 66 Discharge Destination: Home Respiratory Rate (breaths/min): 16 Transportation: Private Auto Blood Pressure (mmHg): 133/79 Accompanied By: self Schedule Follow-up Appointment: Yes Clinical Summary of Care: Patient Declined Electronic Signature(s) Signed: 11/22/2021 5:17:06 PM By: Adline Peals Entered By: Adline Peals on 11/19/2021 13:09:27 -------------------------------------------------------------------------------- Lower Extremity Assessment Details Patient Name: Date of Service: Colin Loveless A. 11/19/2021 12:45 PM Medical Record Number: [id number] Patient Account Number: [id number] Date of Birth/Sex: Treating RN: [birth year] (66 y.o. [name] Primary Care Christohper Dube: [name] Other Clinician: Referring Tylie Golonka: Treating Osmin Welz/Extender: [name] Weeks in Treatment: 12 Edema Assessment Assessed: Shirlyn Goltz: No] [Right: No] Edema: [Left: Ye] [Right: s] Calf Left: Right: Point of Measurement: From Medial Instep 38.7 cm Ankle Left: Right: Point of Measurement: From Medial Instep 24.5 cm Vascular Assessment Pulses: Dorsalis Pedis Palpable: [Left:Yes] Electronic Signature(s) Signed: 11/22/2021 5:17:06 PM By: Adline Peals Entered By: Adline Peals on 11/19/2021 12:32:51 -------------------------------------------------------------------------------- Multi Wound Chart Details Patient Name: Date of Service: NA [name] A. 11/19/2021 12:45 PM Medical Record Number: [id number] Patient Account Number: [id number] Date of Birth/Sex: Treating RN: 1956-03-16 (66 y.o. [name] Primary Care Andreika Vandagriff: [name] Other Clinician: Referring Ladye Macnaughton: Treating Garvis Downum/Extender: [name] Weeks in Treatment: 12 Vital Signs Height(in): 74 Pulse(bpm): 100 Weight(lbs): 186 Blood  Pressure(mmHg): 133/79 Body Mass Index(BMI): 23.9 Temperature(F): 97.6 Respiratory Rate(breaths/min): 16 Photos: [1:Left, Medial Foot] [N/A:N/A N/A] Wound Location: [1:Gradually Appeared] [N/A:N/A] Wounding Event: [1:Diabetic Wound/Ulcer of the Lower] [N/A:N/A] Primary Etiology: [1:Extremity Cataracts, Coronary Artery Disease, N/A] Comorbid History: [1:Hypertension, Myocardial Infarction, Peripheral Arterial Disease, Type II Diabetes, Osteomyelitis, Neuropathy 02/22/2021] [N/A:N/A] Date Acquired: [1:12] [N/A:N/A] Weeks of Treatment: [1:Open] [N/A:N/A] Wound Status: [1:No] [N/A:N/A] Wound Recurrence: [1:6x3.6x0.2] [N/A:N/A] Measurements L x W x D (cm) [1:16.965] [N/A:N/A] A (cm) : rea [1:3.393] [N/A:N/A] Volume (cm) : [1:-20.00%] [N/A:N/A] % Reduction in A [1:rea: 76.00%] [N/A:N/A] % Reduction in Volume: [  1:12] Position 1 (o'clock): [1:1.4] Maximum Distance 1 (cm): [1:9] Position 2 (o'clock): [1:1.2] Maximum Distance 2 (cm): [1:Yes] [N/A:N/A] Tunneling: [1:Grade 3] [N/A:N/A] Classification: [1:Medium] [N/A:N/A] Exudate A mount: [1:Serosanguineous] [N/A:N/A] Exudate Type: [1:red, brown] [N/A:N/A] Exudate Color: [1:Distinct, outline attached] [N/A:N/A] Wound Margin: [1:Large (67-100%)] [N/A:N/A] Granulation A mount: [1:Red, Pink] [N/A:N/A] Granulation Quality: [1:Small (1-33%)] [N/A:N/A] Necrotic A mount: [1:Fat Layer (Subcutaneous Tissue): Yes N/A] Exposed Structures: [1:Fascia: No Tendon: No Muscle: No Joint: No Bone: No Small (1-33%)] [N/A:N/A] Epithelialization: [1:Debridement - Excisional] [N/A:N/A] Debridement: Pre-procedure Verification/Time Out 12:42 [N/A:N/A] Taken: [1:Other] [N/A:N/A] Pain Control: [1:Subcutaneous, Slough] [N/A:N/A] Tissue Debrided: [1:Skin/Subcutaneous Tissue] [N/A:N/A] Level: [1:21.6] [N/A:N/A] Debridement A (sq cm): [1:rea Curette] [N/A:N/A] Instrument: [1:Minimum] [N/A:N/A] Bleeding: [1:Pressure] [N/A:N/A] Hemostasis A chieved: [1:0]  [N/A:N/A] Procedural Pain: [1:0] [N/A:N/A] Post Procedural Pain: [1:Procedure was tolerated well] [N/A:N/A] Debridement Treatment Response: [1:6x3.6x0.2] [N/A:N/A] Post Debridement Measurements L x W x D (cm) [1:3.393] [N/A:N/A] Post Debridement Volume: (cm) [1:Debridement] [N/A:N/A] Treatment Notes Electronic Signature(s) Signed: 11/19/2021 12:44:37 PM By: [name] MD FACS Signed: 11/22/2021 5:17:06 PM By: Adline Peals Entered By: [name] on 11/19/2021 12:44:37 -------------------------------------------------------------------------------- Multi-Disciplinary Care Plan Details Patient Name: Date of Service: NA [name] A. 11/19/2021 12:45 PM Medical Record Number: [id number] Patient Account Number: [id number] Date of Birth/Sex: Treating RN: 09-08-1955 (66 y.o. [name] Primary Care Ezekial Arns: [name] Other Clinician: Referring Trinity Hyland: Treating Anarosa Kubisiak/Extender: [name] in Treatment: 12 Multidisciplinary Care Plan reviewed with physician Active Inactive HBO Nursing Diagnoses: Anxiety related to feelings of confinement associated with the hyperbaric oxygen chamber Anxiety related to knowledge deficit of hyperbaric oxygen therapy and treatment procedures Discomfort related to temperature and humidity changes inside hyperbaric chamber Potential for barotraumas to ears, sinuses, teeth, and lungs or cerebral gas embolism related to changes in atmospheric pressure inside hyperbaric oxygen chamber Potential for oxygen toxicity seizures related to delivery of 100% oxygen at an increased atmospheric pressure Potential for pulmonary oxygen toxicity related to delivery of 100% oxygen at an increased atmospheric pressure Goals: Barotrauma will be prevented during HBO2 Date Initiated: 09/17/2021 T arget Resolution Date: 12/10/2021 Goal Status: Active Patient will tolerate the hyperbaric oxygen therapy treatment Date  Initiated: 09/17/2021 T arget Resolution Date: 12/10/2021 Goal Status: Active Patient will tolerate the internal climate of the chamber Date Initiated: 09/17/2021 T arget Resolution Date: 12/10/2021 Goal Status: Active Patient/caregiver will verbalize understanding of HBO goals, rationale, procedures and potential hazards Date Initiated: 09/17/2021 T arget Resolution Date: 12/10/2021 Goal Status: Active Signs and symptoms of pulmonary oxygen toxicity will be recognized and promptly addressed Date Initiated: 09/17/2021 T arget Resolution Date: 12/10/2021 Goal Status: Active Signs and symptoms of seizure will be recognized and promptly addressed ; seizing patients will suffer no harm Date Initiated: 09/17/2021 T arget Resolution Date: 12/10/2021 Goal Status: Active Interventions: Administer decongestants, per physician orders, prior to HBO2 Administer the correct therapeutic gas delivery based on the patients needs and limitations, per physician order Assess and provide for patients comfort related to the hyperbaric environment and equalization of middle ear Assess for signs and symptoms related to adverse events, including but not limited to confinement anxiety, pneumothorax, oxygen toxicity and baurotrauma Assess patient for any history of confinement anxiety Assess patient's knowledge and expectations regarding hyperbaric medicine and provide education related to the hyperbaric environment, goals of treatment and prevention of adverse events Implement protocols to decrease risk of pneumothorax in high risk patients Notes: Nutrition Nursing Diagnoses: Impaired glucose control: actual or potential Goals: Patient/caregiver verbalizes understanding  of need to maintain therapeutic glucose control per primary care physician Date Initiated: 08/25/2021 Target Resolution Date: 12/10/2021 Goal Status: Active Interventions: Assess HgA1c results as ordered upon admission and as needed Provide education on  elevated blood sugars and impact on wound healing Notes: Osteomyelitis Nursing Diagnoses: Infection: osteomyelitis Knowledge deficit related to disease process and management Goals: Patient's osteomyelitis will resolve Date Initiated: 09/17/2021 Target Resolution Date: 12/10/2021 Goal Status: Active Interventions: Assess for signs and symptoms of osteomyelitis resolution every visit Provide education on osteomyelitis Treatment Activities: Surgical debridement : 09/17/2021 Systemic antibiotics : 09/17/2021 T ordered outside of clinic : 09/17/2021 est Notes: Wound/Skin Impairment Nursing Diagnoses: Impaired tissue integrity Goals: Patient/caregiver will verbalize understanding of skin care regimen Date Initiated: 08/25/2021 Target Resolution Date: 12/10/2021 Goal Status: Active Ulcer/skin breakdown will have a volume reduction of 30% by week 4 Date Initiated: 08/25/2021 Date Inactivated: 10/08/2021 Target Resolution Date: 10/20/2021 Goal Status: Met Interventions: Assess patient/caregiver ability to obtain necessary supplies Assess patient/caregiver ability to perform ulcer/skin care regimen upon admission and as needed Assess ulceration(s) every visit Provide education on ulcer and skin care Treatment Activities: Topical wound management initiated : 08/25/2021 Notes: Electronic Signature(s) Signed: 11/22/2021 5:17:06 PM By: Adline Peals Entered By: Adline Peals on 11/19/2021 12:35:21 -------------------------------------------------------------------------------- Pain Assessment Details Patient Name: Date of Service: Colin Loveless A. 11/19/2021 12:45 PM Medical Record Number: [id number] Patient Account Number: [id number] Date of Birth/Sex: Treating RN: Dec 24, 1955 (66 y.o. [name] Primary Care Hunter Pinkard: [name] Other Clinician: Referring Hayleigh Bawa: Treating Kayleigh Broadwell/Extender: [name] Weeks in Treatment: 12 Active  Problems Location of Pain Severity and Description of Pain Patient Has Paino No Site Locations Rate the pain. Current Pain Level: 0 Pain Management and Medication Current Pain Management: Electronic Signature(s) Signed: 11/22/2021 5:17:06 PM By: Adline Peals Entered By: Adline Peals on 11/19/2021 12:28:30 -------------------------------------------------------------------------------- Patient/Caregiver Education Details Patient Name: Date of Service: NA [name] A. 6/9/2023andnbsp12:45 PM Medical Record Number: [id number] Patient Account Number: [id number] Date of Birth/Gender: Treating RN: [birth year] (66 y.o. [name] Primary Care Physician: [name] Other Clinician: Referring Physician: Treating Physician/Extender: [name] in Treatment: 12 Education Assessment Education Provided To: Patient Education Topics Provided Wound/Skin Impairment: Methods: Explain/Verbal Responses: Reinforcements needed, State content correctly Electronic Signature(s) Signed: 11/22/2021 5:17:06 PM By: Adline Peals Entered By: Adline Peals on 11/19/2021 12:35:32 -------------------------------------------------------------------------------- Wound Assessment Details Patient Name: Date of Service: Colin Loveless A. 11/19/2021 12:45 PM Medical Record Number: [id number] Patient Account Number: [id number] Date of Birth/Sex: Treating RN: 1955/08/02 (66 y.o. [name] Primary Care Kallon Caylor: [name] Other Clinician: Referring Shonnie Poudrier: Treating [name]/Extender: [name] Weeks in Treatment: 12 Wound Status Wound Number: 1 Primary Diabetic Wound/Ulcer of the Lower Extremity Etiology: Wound Location: Left, Medial Foot Wound Open Wounding Event: Gradually Appeared Status: Date Acquired: 02/22/2021 Comorbid Cataracts, Coronary Artery Disease, Hypertension, Myocardial Weeks Of Treatment:  12 History: Infarction, Peripheral Arterial Disease, Type II Diabetes, Clustered Wound: No Osteomyelitis, Neuropathy Photos Wound Measurements Length: (cm) 6 Width: (cm) 3.6 Depth: (cm) 0.2 Area: (cm) 16.965 Volume: (cm) 3.393 % Reduction in Area: -20% % Reduction in Volume: 76% Epithelialization: Small (1-33%) Tunneling: Yes Location 1 Position (o'clock): 12 Maximum Distance: (cm) 1.4 Location 2 Position (o'clock): 9 Maximum Distance: (cm) 1.2 Undermining: No Wound Description Classification: Grade 3 Wound Margin: Distinct, outline attached Exudate Amount: Medium Exudate Type: Serosanguineous Exudate Color: red, brown Foul Odor After Cleansing: No Slough/Fibrino Yes Wound Bed Granulation  Amount: Large (67-100%) Exposed Structure Granulation Quality: Red, Pink Fascia Exposed: No Necrotic Amount: Small (1-33%) Fat Layer (Subcutaneous Tissue) Exposed: Yes Necrotic Quality: Adherent Slough Tendon Exposed: No Muscle Exposed: No Joint Exposed: No Bone Exposed: No Treatment Notes Wound #1 (Foot) Wound Laterality: Left, Medial Cleanser Wound Cleanser Discharge Instruction: Cleanse the wound with wound cleanser prior to applying a clean dressing using gauze sponges, not tissue or cotton balls. Peri-Wound Care Skin Prep Discharge Instruction: Use skin prep as directed Zinc Oxide Ointment 30g tube Discharge Instruction: Apply Zinc Oxide to periwound with each dressing change Topical Primary Dressing Promogran Prisma Matrix, 4.34 (sq in) (silver collagen) Discharge Instruction: Moisten collagen with saline or hydrogel Wound Vac Secondary Dressing Secured With Compression Wrap Compression Stockings Add-Ons Electronic Signature(s) Signed: 11/22/2021 5:17:06 PM By: Adline Peals Signed: 11/22/2021 5:17:06 PM By: Adline Peals Entered By: Adline Peals on 11/19/2021  12:36:25 -------------------------------------------------------------------------------- Vitals Details Patient Name: Date of Service: NA [name] A. 11/19/2021 12:45 PM Medical Record Number: [id number] Patient Account Number: [id number] Date of Birth/Sex: Treating RN: 05/23/1956 (66 y.o. [name] Primary Care Stormy Sabol: [name] Other Clinician: Referring Trexton Escamilla: Treating Sheppard Luckenbach/Extender: [name] Weeks in Treatment: 12 Vital Signs Time Taken: 12:25 Temperature (F): 97.6 Height (in): 74 Pulse (bpm): 66 Weight (lbs): 186 Respiratory Rate (breaths/min): 16 Body Mass Index (BMI): 23.9 Blood Pressure (mmHg): 133/79 Reference Range: 80 - 120 mg / dl Electronic Signature(s) Signed: 11/22/2021 5:17:06 PM By: Adline Peals Entered By: Adline Peals on 11/19/2021 82:99:37

## 2021-11-22 NOTE — Progress Notes (Signed)
ARSEN, MANGIONE ([id number]) Visit Report for 11/19/2021 Chief Complaint Document Details Patient Name: Date of Service: NA [name] 11/19/2021 12:45 PM Medical Record Number: [id number] Patient Account Number: [id number] Date of Birth/Sex: Treating RN: 1956/03/26 (66 y.o. [name] Primary Care Provider: [name] Other Clinician: Referring Provider: Treating Provider/Extender: [name] in Treatment: 12 Information Obtained from: Patient Chief Complaint Patients presents for treatment of an open diabetic ulcer with exposed bone and osteomyelitis Electronic Signature(s) Signed: 11/19/2021 12:46:35 PM By: [name] MD FACS Previous Signature: 11/19/2021 12:44:44 PM Version By: [name] MD FACS Entered By: [name] on 11/19/2021 12:46:35 -------------------------------------------------------------------------------- Debridement Details Patient Name: Date of Service: NA [name] 11/19/2021 12:45 PM Medical Record Number: [id number] Patient Account Number: [id number] Date of Birth/Sex: Treating RN: 03-29-1956 (66 y.o. [name] Primary Care Provider: [name] Other Clinician: Referring Provider: Treating Provider/Extender: [name]: 12 Debridement Performed for Assessment: Wound #1 Left,Medial Foot Performed By: Physician [name], MD Debridement Type: Debridement Severity of Tissue Pre Debridement: Fat layer exposed Level of Consciousness (Pre-procedure): Awake and Alert Pre-procedure Verification/Time Out Yes - 12:42 Taken: Start Time: 12:42 Pain Control: Other : benzocaine 20% spray T Area Debrided (L x W): otal 6 (cm) x 3.6 (cm) = 21.6 (cm) Tissue and other material debrided: Viable, Non-Viable, Slough, Subcutaneous, Slough Level: Skin/Subcutaneous Tissue Debridement Description: Excisional Instrument: Curette Bleeding: Minimum Hemostasis  Achieved: Pressure Procedural Pain: 0 Post Procedural Pain: 0 Response to Treatment: Procedure was tolerated well Level of Consciousness (Post- Awake and Alert procedure): Post Debridement Measurements of Total Wound Length: (cm) 6 Width: (cm) 3.6 Depth: (cm) 0.2 Volume: (cm) 3.393 Character of Wound/Ulcer Post Debridement: Improved Severity of Tissue Post Debridement: Fat layer exposed Post Procedure Diagnosis Same as Pre-procedure Electronic Signature(s) Signed: 11/19/2021 3:49:59 PM By: [name] MD FACS Signed: 11/22/2021 5:17:06 PM By: Samuella Bruin Entered By: Samuella Bruin on 11/19/2021 12:42:49 -------------------------------------------------------------------------------- HPI Details Patient Name: Date of Service: NA [name] 11/19/2021 12:45 PM Medical Record Number: [id number] Patient Account Number: [id number] Date of Birth/Sex: Treating RN: [birth year] (66 y.o. [name] Primary Care Provider: [name] Other Clinician: Referring Provider: Treating Provider/Extender: [name] in Treatment: 12 History of Present Illness HPI Description: ADMISSION 08/25/2021 This is a 66 year old man man who initially presented to his primary care provider in September 2022 with pain in his left foot. He was sent for an x-ray and while the x-ray was being performed, the tech pointed out a wound on his foot that the patient was not aware existed. He does have type 2 diabetes with significant neuropathy. His diabetes is suboptimally controlled with his most recent A1c being 8.5. He also has a history of coronary artery disease status post three- vessel CABG. he was initially seen by orthopedics, but they referred him to [facility]. He has undergone at least 7 operations/debridements and several applications of skin substitute under the care of podiatry. He has been in a wound VAC for much of this time. His  most recent procedure was July 28, 2021. A portion of the talus was biopsied and was found to be consistent with osteomyelitis. Culture also returned positive for corynebacterium. He was seen on August 16, 2021 by infectious disease. A PICC line has been placed and he will be receiving a 6-week course of IV daptomycin and cefepime. In October 2022, he underwent  lower extremity vascular studies. Results are copied here: Right: Resting right ankle-brachial index is within normal range. No evidence of significant right lower extremity arterial disease. The right toe-brachial index is abnormal. Left: Resting left ankle-brachial index indicates mild left lower extremity arterial disease. The left toe-brachial index is abnormal. He has not been seen by vascular surgery despite these findings. He presented to clinic today in a cam boot and is using a knee scooter to offload. Wound VAC was in place. Once this was removed, a large ulcer was identified on the left midfoot/ankle. Bone is frankly exposed. There is no malodorous or purulent drainage. There is some granulation tissue over the central portion of the exposed bone. There is a tunnel that extends posteriorly for roughly 10 cm. It has been discussed with him by multiple providers that he is at very high risk of losing his lower leg because of this wound. He is extremely eager to avoid this outcome and is here today to review his options as well as receive ongoing wound care. 09/03/2021: Here for reevaluation of his wound. There does not appear to have been any substantial improvement overall since our last visit. He has been in a wound VAC with white foam overlying the exposed bone. We are working on getting him approved for hyperbaric oxygen therapy. 09/10/2021: We are in the process of getting him cleared to begin hyperbaric oxygen therapy. He still needs to obtain a chest x-ray. Although the wound measurements are roughly the same, I think the  overall appearance of the wound is better. The exposed bone has a bit more granulation tissue covering it. He has not received a vascular surgery appointment to reevaluate his flow to the wound. 09/17/2021: He has been approved for hyperbaric oxygen therapy and completed his chest x-ray, which I reviewed and it appears normal. The tunnels at the 12 and 10:00 positions are smaller. There is more granulation tissue covering the exposed bone and the undermining has decreased. He still has not received a vascular surgery appointment. 09/24/2021: He initiated hyperbaric oxygen therapy this week and is tolerating it well. He has an appointment with vascular surgery coming up on May 16. The granulation tissue is covering more of the exposed bone and both tunnels are a bit smaller. 10/01/2021: He continues to tolerate hyperbaric oxygen therapy. He saw infectious disease and they are planning to pull his PICC line. He has been initiated on oral antibiotics (doxycycline and Augmentin). The wound looks about the same but the tunnels are a little bit smaller. The skin seems to be contracting somewhat around the exposed bone. 10/08/2021: The wound is still about the same size, but the tunnels continue to come in and the skin is contracting around the exposed bone. He continues to have some accumulation of necrotic material in the inferoposterior aspect of the wound as well as accumulation at the 12:00 tunnel area. 10/15/2021: The wound is smaller today. The tunnels continue to come in. There is less necrotic tissue present. He does have some periwound maceration. 10/22/2021: The wound is about the same size. There is a little bit less undermining at the distal portion. The exposed bone is dark and I am not sure if this is staining from silver nitrate or his VAC sponge or if it represents necrosis. The tunnels are shallower but he does have some serous drainage coming from the 10:00 tunnel. He continues to tolerate  hyperbaric oxygen therapy well. 10/29/2021: The undermining continues to improve. The tunnels are about  the same. He has good granulation tissue overlying the majority of the exposed bone. It does appear that perhaps the tubing from his wound VAC has been eroding the skin at the 12 clock position. He continues to accumulate senescent epithelium around the borders of the wound. 11/05/2021: The undermining is almost completely resolved. The tunnels have contracted fairly significantly. No significant slough or debris accumulation. There is still senescent epithelium accumulation around the borders of the wound. He has been tolerating hyperbaric oxygen therapy well. 11/12/2021: Despite the measurements of the wound being about the same, the wound has changed in its shape and overall, I think it is improved. The undermining has resolved and the tunnels continue to shorten. There is good granulation tissue encroaching over the small area of bone that has remained exposed at the 12 o'clock position. Minimal slough accumulation. He continues to tolerate hyperbaric oxygen therapy well. 11/19/2021: I took a PCR culture last week. There was overgrowth of yeast. He is already taking suppressive doxycycline and Augmentin. I added fluconazole to his regimen. The wound is smaller again today. The tunnels continue to shorten. He continues to do well with hyperbaric oxygen therapy. Electronic Signature(s) Signed: 11/19/2021 12:47:37 PM By: Duanne Guessannon, Jamesyn Lindell MD FACS Entered By: Duanne Guessannon, Stori Royse on 11/19/2021 12:47:37 -------------------------------------------------------------------------------- Physical Exam Details Patient Name: Date of Service: NA [name] NephewRRO N, [name] 11/19/2021 12:45 PM Medical Record Number: [id number] Patient Account Number: [id number] Date of Birth/Sex: Treating RN: 12-19-1955 (66 y.o. Marlan PalauM) Herrington, Taylor Primary Care Provider: Simone CuriaLee, Keung Other Clinician: Referring Provider: Treating  Provider/Extender: [name], Willie Loy Lee, Keung Weeks in Treatment: 12 Constitutional . . . . No acute distress.Marland Kitchen. Respiratory Normal work of breathing on room air.. Notes 11/19/2021: The wound is smaller today. There is good granulation tissue on the surface. The tunnels continue to close in. Small amount of slough accumulation where the wound meets normal skin. Electronic Signature(s) Signed: 11/19/2021 12:50:53 PM By: Duanne Guessannon, Eimi Viney MD FACS Entered By: Duanne Guessannon, Itay Mella on 11/19/2021 12:50:52 -------------------------------------------------------------------------------- Physician Orders Details Patient Name: Date of Service: NA [name] NephewRO N, [name] 11/19/2021 12:45 PM Medical Record Number: [id number] Patient Account Number: [id number] Date of Birth/Sex: Treating RN: 12-19-1955 (66 y.o. 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 - Dr. Lady Garyannon - Room 2 Return A Bathing/ Shower/ Hygiene May shower with protection but do not get wound dressing(s) wet. Negative Presssure Wound Therapy Wound Vac to wound continuously at 19525mm/hg pressure Black Foam White Foam - Apply white foam or saline moistened gauze to tunnel at 10:00 and 12:00 Other: - Apply silver collagen on wound bed, under black foam Home Health No change in wound care orders this week; continue Home Health for wound care. May utilize formulary  equivalent dressing for wound treatment orders unless otherwise specified. Dressing changes to be completed by Home Health on Monday / Wednesday / Friday except [facility] Other Home Health Orders/Instructions: [name]: Change vac 3x week Mon. Wed. and Fri. after wound care visit. Hyperbaric Oxygen Therapy Evaluate  for HBO Therapy Indication: - Wagner 3 diabetic ulcer left foot If appropriate for treatment, begin HBOT per protocol: 2.5 ATA for 90 Minutes with 2 Five (5) Minute A Breaks ir Total Number of Treatments: - 11/05/2021 additional 40 treatments. One treatments per day (delivered Monday through Friday unless otherwise specified in Special Instructions below): Finger stick Blood Glucose Pre- and Post- HBOT Treatment. Follow Hyperbaric Oxygen Glycemia Protocol A frin (Oxymetazoline HCL) 0.05% nasal spray - 1 spray in both nostrils daily as needed prior to HBO treatment for difficulty clearing ears Wound Treatment Wound #1 - Foot Wound Laterality: Left, Medial Cleanser: Wound Cleanser 3 x Per Week/30 Days Discharge Instructions: Cleanse the wound with wound cleanser prior to applying a clean dressing using gauze sponges, not tissue or cotton balls. Peri-Wound Care: Skin Prep 3 x Per Week/30 Days Discharge Instructions: Use skin prep as directed Peri-Wound Care: Zinc Oxide Ointment 30g tube 3 x Per Week/30 Days Discharge Instructions: Apply Zinc Oxide to periwound with each dressing change Prim Dressing: Promogran Prisma Matrix, 4.34 (sq in) (silver collagen) 3 x Per Week/30 Days ary Discharge Instructions: Moisten collagen with saline or hydrogel Prim Dressing: Wound Vac ary 3 x Per Week/30 Days GLYCEMIA INTERVENTIONS PROTOCOL PRE-HBO GLYCEMIA INTERVENTIONS ACTION INTERVENTION Obtain pre-HBO capillary blood glucose (ensure 1 physician order is in chart). A. Notify HBO physician and await physician orders. 2 If result is 70 mg/dl  or below: B. If the result meets the hospital definition of a critical result, follow hospital policy. A. Give patient an 8 ounce Glucerna Shake, an 8 ounce Ensure, or 8 ounces of a Glucerna/Ensure equivalent dietary supplement*. B. Wait 30 minutes. If result is 71 mg/dl to 161 mg/dl: C. Retest patients capillary blood glucose (CBG). D. If result greater than or equal to 110 mg/dl, proceed with HBO. If result less than 110 mg/dl, notify HBO physician and consider holding HBO. If result is 131 mg/dl to 096 mg/dl: A. Proceed with HBO. A. Notify HBO physician and await physician orders. B. It is recommended to hold HBO and do If result is 250 mg/dl or greater: blood/urine ketone testing. C. If the result meets the hospital definition of a critical result, follow hospital policy. POST-HBO GLYCEMIA INTERVENTIONS ACTION INTERVENTION Obtain post HBO capillary blood glucose (ensure 1 physician order is in chart). A. Notify HBO physician and await physician orders. 2 If result is 70 mg/dl or below: 2 If result is 70 mg/dl or below: B. If the result meets the hospital definition of a critical result, follow hospital policy. A. Give patient an 8 ounce Glucerna Shake, an 8 ounce Ensure, or 8 ounces of a Glucerna/Ensure equivalent dietary supplement*. B. Wait 15 minutes for symptoms of If result is 71 mg/dl to 045 mg/dl: hypoglycemia (i.e. nervousness, anxiety, sweating, chills, clamminess, irritability, confusion, tachycardia or dizziness). C. If patient asymptomatic, discharge patient. If patient symptomatic, repeat capillary blood glucose (CBG) and notify HBO physician. If result is 101 mg/dl to 409 mg/dl: A. Discharge patient. A. Notify HBO physician and await physician orders. B. It is recommended to do blood/urine ketone If result is 250 mg/dl or greater: testing. C. If the result meets the hospital definition of a critical result, follow hospital policy. *Juice or  candies are NOT equivalent products. If patient refuses the Glucerna or Ensure, please consult the hospital dietitian for an appropriate substitute. Electronic Signature(s) Signed: 11/19/2021 3:49:59 PM By: [name] MD FACS Entered By: [name] on 11/19/2021 12:51:08 -------------------------------------------------------------------------------- Problem List Details Patient Name:  Date of Service: Jerelyn Scott 11/19/2021 12:45 PM Medical Record Number: [id number] Patient Account Number: [id number] Date of Birth/Sex: Treating RN: [birth year] (66 y.o. 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 11/19/2021 12:45 PM Medical Record Number: [id number] Patient Account Number: [id number] Date of Birth/Sex: Treating RN: [birth year] (66 y.o. [name] Primary Care Provider: [name] Other Clinician: Referring Provider: Treating Provider/Extender: [name] in Treatment: 12 Subjective Chief  Complaint Information obtained from Patient Patients presents for treatment of an open diabetic ulcer with exposed bone and osteomyelitis History of Present Illness (HPI) ADMISSION 08/25/2021 This is a 66 year old man who initially presented to his primary care provider in September 2022 with pain in his left foot. He was sent for an x-ray and while the x-ray was being performed, the tech pointed out a wound on his foot that the patient was not aware existed. He does have type 2 diabetes with significant neuropathy. His diabetes is suboptimally controlled with his most recent A1c being 8.5. He also has a history of coronary artery disease status post three- vessel CABG. he was initially seen by orthopedics, but they referred him to [facility]. He has undergone at least 7 operations/debridements and several applications of skin substitute under the care of podiatry. He has been in a wound VAC for much of this time. His most recent procedure was July 28, 2021. A portion of the talus was biopsied and was found to be consistent with osteomyelitis. Culture also returned positive for corynebacterium. He was seen on August 16, 2021 by infectious disease. A PICC line has been placed and he will be receiving a 6-week course of IV daptomycin and cefepime. In October 2022, he underwent lower extremity vascular studies. Results are copied here: Right: Resting right ankle-brachial index is within normal range. No evidence of significant right lower extremity arterial disease. The right toe-brachial index is abnormal. Left: Resting left ankle-brachial index indicates mild left lower extremity arterial disease. The left toe-brachial index is abnormal. He has not been seen by vascular surgery despite these findings. He presented to clinic today in a cam boot and is using a knee scooter to offload. Wound VAC was in place. Once this was removed, a large ulcer was identified on the left  midfoot/ankle. Bone is frankly exposed. There is no malodorous or purulent drainage. There is some granulation tissue over the central portion of the exposed bone. There is a tunnel that extends posteriorly for roughly 10 cm. It has been discussed with him by multiple providers that he is at very high risk of losing his lower leg because of this wound. He is extremely eager to avoid this outcome and is here today to review his options as well as  receive ongoing wound care. 09/03/2021: Here for reevaluation of his wound. There does not appear to have been any substantial improvement overall since our last visit. He has been in a wound VAC with white foam overlying the exposed bone. We are working on getting him approved for hyperbaric oxygen therapy. 09/10/2021: We are in the process of getting him cleared to begin hyperbaric oxygen therapy. He still needs to obtain a chest x-ray. Although the wound measurements are roughly the same, I think the overall appearance of the wound is better. The exposed bone has a bit more granulation tissue covering it. He has not received a vascular surgery appointment to reevaluate his flow to the wound. 09/17/2021: He has been approved for hyperbaric oxygen therapy and completed his chest x-ray, which I reviewed and it appears normal. The tunnels at the 12 and 10:00 positions are smaller. There is more granulation tissue covering the exposed bone and the undermining has decreased. He still has not received a vascular surgery appointment. 09/24/2021: He initiated hyperbaric oxygen therapy this week and is tolerating it well. He has an appointment with vascular surgery coming up on May 16. The granulation tissue is covering more of the exposed bone and both tunnels are a bit smaller. 10/01/2021: He continues to tolerate hyperbaric oxygen therapy. He saw infectious disease and they are planning to pull his PICC line. He has been initiated on oral antibiotics (doxycycline and  Augmentin). The wound looks about the same but the tunnels are a little bit smaller. The skin seems to be contracting somewhat around the exposed bone. 10/08/2021: The wound is still about the same size, but the tunnels continue to come in and the skin is contracting around the exposed bone. He continues to have some accumulation of necrotic material in the inferoposterior aspect of the wound as well as accumulation at the 12:00 tunnel area. 10/15/2021: The wound is smaller today. The tunnels continue to come in. There is less necrotic tissue present. He does have some periwound maceration. 10/22/2021: The wound is about the same size. There is a little bit less undermining at the distal portion. The exposed bone is dark and I am not sure if this is staining from silver nitrate or his VAC sponge or if it represents necrosis. The tunnels are shallower but he does have some serous drainage coming from the 10:00 tunnel. He continues to tolerate hyperbaric oxygen therapy well. 10/29/2021: The undermining continues to improve. The tunnels are about the same. He has good granulation tissue overlying the majority of the exposed bone. It does appear that perhaps the tubing from his wound VAC has been eroding the skin at the 12 clock position. He continues to accumulate senescent epithelium around the borders of the wound. 11/05/2021: The undermining is almost completely resolved. The tunnels have contracted fairly significantly. No significant slough or debris accumulation. There is still senescent epithelium accumulation around the borders of the wound. He has been tolerating hyperbaric oxygen therapy well. 11/12/2021: Despite the measurements of the wound being about the same, the wound has changed in its shape and overall, I think it is improved. The undermining has resolved and the tunnels continue to shorten. There is good granulation tissue encroaching over the small area of bone that has remained exposed at  the 12 o'clock position. Minimal slough accumulation. He continues to tolerate hyperbaric oxygen therapy well. 11/19/2021: I took a PCR culture last week. There was overgrowth of yeast. He is already taking suppressive doxycycline and Augmentin. I  added fluconazole to his regimen. The wound is smaller again today. The tunnels continue to shorten. He continues to do well with hyperbaric oxygen therapy. Patient History Information obtained from Patient. Family History Cancer - Father, Diabetes - Father,Mother,Paternal Grandparents, Heart Disease - Father, Hypertension - Father, No family history of Hereditary Spherocytosis, Kidney Disease, Lung Disease, Seizures, Stroke, Thyroid Problems, Tuberculosis. Social History Never smoker, Marital Status - Married, Alcohol Use - Rarely, Drug Use - No History, Caffeine Use - Daily. Medical History Eyes Patient has history of Cataracts - Removed 2008 Cardiovascular Patient has history of Coronary Artery Disease, Hypertension, Myocardial Infarction, Peripheral Arterial Disease Endocrine Patient has history of Type II Diabetes Musculoskeletal Patient has history of Osteomyelitis Neurologic Patient has history of Neuropathy Medical A Surgical History Notes nd Cardiovascular Hypercholesterolemia Abnormal EKG CABG X3 2019 Gastrointestinal GERD Musculoskeletal Diabetic foot ulcer Objective Constitutional No acute distress.. Vitals Time Taken: 12:25 PM, Height: 74 in, Weight: 186 lbs, BMI: 23.9, Temperature: 97.6 F, Pulse: 66 bpm, Respiratory Rate: 16 breaths/min, Blood Pressure: 133/79 mmHg. Respiratory Normal work of breathing on room air.. General Notes: 11/19/2021: The wound is smaller today. There is good granulation tissue on the surface. The tunnels continue to close in. Small amount of slough accumulation where the wound meets normal skin. Integumentary (Hair, Skin) Wound #1 status is Open. Original cause of wound was Gradually Appeared. The  date acquired was: 02/22/2021. The wound has been in treatment 12 weeks. The wound is located on the Left,Medial Foot. The wound measures 6cm length x 3.6cm width x 0.2cm depth; 16.965cm^2 area and 3.393cm^3 volume. There is Fat Layer (Subcutaneous Tissue) exposed. There is no undermining noted, however, there is tunneling at 12:00 with a maximum distance of 1.4cm. There is additional tunneling and at 9:00 with a maximum distance of 1.2cm. There is a medium amount of serosanguineous drainage noted. The wound margin is distinct with the outline attached to the wound base. There is large (67-100%) red, pink granulation within the wound bed. There is a small (1-33%) amount of necrotic tissue within the wound bed including Adherent Slough. 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 There was a Excisional Skin/Subcutaneous Tissue Debridement with a total area of 21.6 sq cm performed by [name], MD. With the following instrument(s): Curette to remove Viable and Non-Viable tissue/material. Material removed includes Subcutaneous Tissue and Slough and after achieving pain control using Other (benzocaine 20% spray). No specimens were taken. A time out was conducted at 12:42, prior to the start of the procedure. A Minimum amount of bleeding was controlled with Pressure. The procedure was tolerated well with a pain level of 0 throughout and a pain level of 0 following the procedure. Post Debridement Measurements: 6cm length x  3.6cm width x 0.2cm depth; 3.393cm^3 volume. Character of Wound/Ulcer Post Debridement is improved. Severity of Tissue Post Debridement is: Fat layer exposed. Post procedure Diagnosis Wound #1: Same as Pre-Procedure Plan Follow-up Appointments: Return Appointment in 1 week. - Dr. Lady Gary - Room 2 Bathing/ Shower/ Hygiene: May shower with protection but do not get wound dressing(s) wet. Negative Presssure Wound  Therapy: Wound Vac to wound continuously at 139mm/hg pressure Black Foam White Foam - Apply white foam or saline moistened gauze to tunnel at 10:00 and 12:00 Other: - Apply silver collagen on wound bed, under black foam Home Health: No change in wound care orders this week; continue Home Health for wound care. May utilize formulary equivalent dressing for wound treatment orders unless otherwise specified. Dressing changes to be completed by Home Health on Monday / Wednesday / Friday except when patient has scheduled visit at [facility]. Other Home Health Orders/Instructions: [name]: Change vac 3x week Mon. Wed. and Fri. after wound care visit. Hyperbaric Oxygen Therapy: Evaluate for HBO Therapy Indication: - Wagner 3 diabetic ulcer left foot If appropriate for treatment, begin HBOT per protocol: 2.5 ATA for 90 Minutes with 2 Five (5) Minute Air Breaks T Number of Treatments: - 11/05/2021 additional 40 treatments. otal One treatments per day (delivered Monday through Friday unless otherwise specified in Special Instructions below): Finger stick Blood Glucose Pre- and Post- HBOT Treatment. Follow Hyperbaric Oxygen Glycemia Protocol Afrin (Oxymetazoline HCL) 0.05% nasal spray - 1 spray in both nostrils daily as needed prior to HBO treatment for difficulty clearing ears WOUND #1: - Foot Wound Laterality: Left, Medial Cleanser: Wound Cleanser 3 x Per Week/30 Days Discharge Instructions: Cleanse the wound with wound cleanser prior to applying a clean dressing using gauze  sponges, not tissue or cotton balls. Peri-Wound Care: Skin Prep 3 x Per Week/30 Days Discharge Instructions: Use skin prep as directed Peri-Wound Care: Zinc Oxide Ointment 30g tube 3 x Per Week/30 Days Discharge Instructions: Apply Zinc Oxide to periwound with each dressing change Prim Dressing: Promogran Prisma Matrix, 4.34 (sq in) (silver collagen) 3 x Per Week/30 Days ary Discharge Instructions: Moisten collagen with saline or hydrogel Prim Dressing: Wound Vac 3 x Per Week/30 Days ary 11/19/2021: The wound is smaller today. There is good granulation tissue on the surface. The tunnels continue to close in. Small amount of slough accumulation where the wound meets normal skin. I debrided slough and biofilm from the wound surfaces. We will continue using Prisma silver collagen under the wound VAC. He will complete his course of fluconazole. Follow-up in 1 week. Continue hyperbaric oxygen in the interim. Electronic Signature(s) Signed: 11/19/2021 12:51:40 PM By: [name] MD FACS Entered By: [name] on 11/19/2021 12:51:39 -------------------------------------------------------------------------------- HxROS Details Patient Name: Date of Service: NA [name] 11/19/2021 12:45 PM Medical Record Number: [id number] Patient Account Number: [id number] Date of Birth/Sex: Treating RN: [birth year] (66 y.o. [name] Primary Care Provider: [name] Other Clinician: Referring Provider: Treating Provider/Extender: [name]: 12 Information Obtained From Patient Eyes Medical History: Positive for: Cataracts - Removed 2008 Cardiovascular Medical History: Positive for: Coronary Artery Disease; Hypertension; Myocardial Infarction; Peripheral Arterial Disease Past Medical History Notes: Hypercholesterolemia Abnormal EKG CABG X3 2019 Gastrointestinal Medical History: Past Medical History Notes: GERD Endocrine Medical  History: Positive for: Type II Diabetes Time with diabetes: 24 years Treated with: Insulin, Oral agents Blood sugar tested every day: Yes Tested : Musculoskeletal Medical History: Positive for: Osteomyelitis Past Medical History Notes: Diabetic foot ulcer Neurologic Medical History: Positive for: Neuropathy HBO Extended History Items Eyes: Cataracts Immunizations Pneumococcal Vaccine: Received Pneumococcal Vaccination: Yes Received Pneumococcal Vaccination On or After 60th Birthday: Yes Implantable Devices Yes Family and Social History Cancer: Yes - Father; Diabetes: Yes - Father,Mother,Paternal Grandparents; Heart Disease: Yes - Father; Hereditary Spherocytosis: No; Hypertension: Yes - Father;  Kidney Disease: No; Lung Disease: No; Seizures: No; Stroke: No; Thyroid Problems: No; Tuberculosis: No; Never smoker; Marital Status - Married; Alcohol Use: Rarely; Drug Use: No History; Caffeine Use: Daily; Financial Concerns: No; Food, Clothing or Shelter Needs: No; Support System Lacking: No; Transportation Concerns: No Electronic Signature(s) Signed: 11/19/2021 3:49:59 PM By: [name] MD FACS Signed: 11/22/2021 5:17:06 PM By: Gelene Mink By: [name] on 11/19/2021 12:49:49 -------------------------------------------------------------------------------- SuperBill Details Patient Name: Date of Service: NA [name] 11/19/2021 Medical Record Number: [id number] Patient Account Number: [id number] Date of Birth/Sex: Treating RN: [birth year] (66 y.o. 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 Facility Procedures CPT4 Code: 02725366 Description: 11042 - DEB SUBQ TISSUE 20 SQ CM/< ICD-10 Diagnosis Description 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 Modifier: Quantity: 1 CPT4 Code: 44034742 Description: 11045 - DEB SUBQ TISS EA ADDL 20CM ICD-10 Diagnosis Description 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

## 2021-11-22 NOTE — Progress Notes (Addendum)
[name], [name] ([id number]) Visit Report for 11/22/2021 Problem List Details Patient Name: Date of Service: NA Colin Nephew A. 11/22/2021 10:00 A M Medical Record Number: [id number] Patient Account Number: [id number] Date of Birth/Sex: Treating RN: March 13, 1956 (66 y.o. 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 11/22/2021 Medical Record Number: [id number] Patient Account Number: [id number] Date of Birth/Sex: Treating RN: [birth year] (66 y.o. 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 Facility Procedures CPT4 Code: 81017510 Description: G0277-(Facility Use Only) HBOT full body chamber, , ICD-10 Diagnosis Description E11.621 Type 2 diabetes mellitus with foot ulcer 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 M86.672 Other chronic  osteomyelitis, left ankle and foot 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 Modifier: Quantity: 4 Physician Procedures : CPT4 Code Description Modifier [id number] 99183 - WC PHYS HYPERBARIC OXYGEN THERAPY ICD-10 Diagnosis Description E11.621 Type 2 diabetes mellitus with foot ulcer 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

## 2021-11-23 ENCOUNTER — Encounter (HOSPITAL_BASED_OUTPATIENT_CLINIC_OR_DEPARTMENT_OTHER): Payer: BC Managed Care – PPO | Admitting: General Surgery

## 2021-11-23 ENCOUNTER — Encounter: Payer: BC Managed Care – PPO | Admitting: Vascular Surgery

## 2021-11-23 ENCOUNTER — Encounter (HOSPITAL_COMMUNITY): Payer: BC Managed Care – PPO

## 2021-11-23 DIAGNOSIS — Z951 Presence of aortocoronary bypass graft: Secondary | ICD-10-CM | POA: Diagnosis not present

## 2021-11-23 DIAGNOSIS — T8131XA Disruption of external operation (surgical) wound, not elsewhere classified, initial encounter: Secondary | ICD-10-CM | POA: Diagnosis not present

## 2021-11-23 DIAGNOSIS — B372 Candidiasis of skin and nail: Secondary | ICD-10-CM | POA: Diagnosis not present

## 2021-11-23 DIAGNOSIS — E1169 Type 2 diabetes mellitus with other specified complication: Secondary | ICD-10-CM | POA: Diagnosis not present

## 2021-11-23 DIAGNOSIS — L97324 Non-pressure chronic ulcer of left ankle with necrosis of bone: Secondary | ICD-10-CM | POA: Diagnosis not present

## 2021-11-23 DIAGNOSIS — I2510 Atherosclerotic heart disease of native coronary artery without angina pectoris: Secondary | ICD-10-CM | POA: Diagnosis not present

## 2021-11-23 DIAGNOSIS — E11610 Type 2 diabetes mellitus with diabetic neuropathic arthropathy: Secondary | ICD-10-CM | POA: Diagnosis not present

## 2021-11-23 DIAGNOSIS — M86672 Other chronic osteomyelitis, left ankle and foot: Secondary | ICD-10-CM | POA: Diagnosis not present

## 2021-11-23 DIAGNOSIS — E11622 Type 2 diabetes mellitus with other skin ulcer: Secondary | ICD-10-CM | POA: Diagnosis not present

## 2021-11-23 LAB — GLUCOSE, CAPILLARY
Glucose-Capillary: 232 mg/dL — ABNORMAL HIGH (ref 70–99)
Glucose-Capillary: 216 mg/dL — ABNORMAL HIGH (ref 70–99)

## 2021-11-23 NOTE — Progress Notes (Signed)
Colin, [name] ([id number]) Visit Report for 11/23/2021 Arrival Information Details Patient Name: Date of Service: NA [name] 11/23/2021 10:00 A M Medical Record Number: [id number] Patient Account Number: [id number] Date of Birth/Sex: Treating RN: 03-Nov-1955 (66 y.o. [name] Primary Care [name]: [name] Other Clinician: [name] Referring [name]: Treating [name]/Extender: [name] in Treatment: 12 Visit Information History Since Last Visit All ordered tests and consults were completed: Yes Patient Arrived: Knee Scooter Added or deleted any medications: No Arrival Time: 09:24 Any new allergies or adverse reactions: No Accompanied By: Wife Had a fall or experienced change in No Transfer Assistance: None activities of daily living that may affect Patient Identification Verified: Yes risk of falls: Secondary Verification Process Completed: Yes Signs or symptoms of abuse/neglect since last visito No Patient Requires Transmission-Based Precautions: No Hospitalized since last visit: No Patient Has Alerts: Yes Implantable device outside of the clinic excluding No Patient Alerts: Patient on Blood Thinner cellular tissue based products placed in the center since last visit: Pain Present Now: No Electronic Signature(s) Signed: 11/23/2021 2:52:15 PM By: [name] EMT Entered By: [name] on 11/23/2021 14:52:15 -------------------------------------------------------------------------------- Encounter Discharge Information Details Patient Name: Date of Service: NA Colin [name] 11/23/2021 10:00 A M Medical Record Number: [id number] Patient Account Number: [id number] Date of Birth/Sex: Treating RN: May 01, 1956 (66 y.o. [name] Primary Care [name]: [name] Other Clinician: [name] Referring [name]: Treating [name]/Extender: [name] in Treatment: 12 Encounter  Discharge Information Items Discharge Condition: Stable Ambulatory Status: Knee Scooter Discharge Destination: Home Transportation: Private Auto Accompanied By: Wife Schedule Follow-up Appointment: Yes Clinical Summary of Care: Electronic Signature(s) Signed: 11/23/2021 2:58:30 PM By: [name] EMT Entered By: [name] on 11/23/2021 14:58:29 -------------------------------------------------------------------------------- Vitals Details Patient Name: Date of Service: NA [name], [name] 11/23/2021 10:00 A M Medical Record Number: [id number] Patient Account Number: [id number] Date of Birth/Sex: Treating RN: 1955/06/20 (66 y.o. [name] Primary Care [name]: [name] Other Clinician: [name] Referring Lafern Brinkley: Treating [name]/Extender: [name] Weeks in Treatment: 12 Vital Signs Time Taken: 09:37 Temperature (F): 97.2 Height (in): 74 Pulse (bpm): 75 Weight (lbs): 186 Respiratory Rate (breaths/min): 16 Body Mass Index (BMI): 23.9 Blood Pressure (mmHg): 137/82 Capillary Blood Glucose (mg/dl): 680 Reference Range: 80 - 120 mg / dl Electronic Signature(s) Signed: 11/23/2021 2:52:54 PM By: [name] EMT Entered By: [name] on 11/23/2021 14:52:54

## 2021-11-23 NOTE — Progress Notes (Signed)
[name], [name] ([id number]) Visit Report for 11/23/2021 Problem List Details Patient Name: Date of Service: NA [name] 11/23/2021 10:00 A M Medical Record Number: [id number] Patient Account Number: [id number] Date of Birth/Sex: Treating RN: Jan 20, 1956 (65 y.o. 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 11/23/2021 Medical Record Number: [id number] Patient Account Number: [id number] Date of Birth/Sex: Treating RN: 07-23-1955 (66 y.o. 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 Facility Procedures CPT4 Code: 46270350 Description: G0277-(Facility Use Only) HBOT full body chamber, , ICD-10 Diagnosis Description E11.621 Type 2 diabetes mellitus with foot ulcer 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 M86.672 Other chronic  osteomyelitis, left ankle and foot 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 Modifier: Quantity: 4 Physician Procedures : CPT4 Code Description Modifier [id number] 99183 - WC PHYS HYPERBARIC OXYGEN THERAPY ICD-10 Diagnosis Description E11.621 Type 2 diabetes mellitus with foot ulcer 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

## 2021-11-23 NOTE — Progress Notes (Addendum)
[name], [name] ([id number]) Visit Report for 11/23/2021 HBO Details Patient Name: Date of Service: NA Colin Nephew A. 11/23/2021 10:00 A M Medical Record Number: [id number] Patient Account Number: [id number] Date of Birth/Sex: Treating RN: [birth year] (66 y.o. [name] Primary Care Minh [place]: [name] Other Clinician: [name] Referring Ronneisha Jett: Treating Carmen Tolliver/Extender: [name]: 12 HBO Treatment Course Details Treatment Course Number: 1 Ordering Randy Whitener: [name] T Treatments Ordered: otal 80 HBO Treatment Start Date: 09/20/2021 HBO Indication: Diabetic Ulcer(s) of the Lower Extremity Standard/Conservative Wound Care tried and failed greater than or equal to 30 days HBO Treatment Details Treatment Number: 45 Patient Type: Outpatient Chamber Type: Monoplace Chamber Serial #: [id number] Treatment Protocol: 2.5 ATA with 90 minutes oxygen, with two 5 minute air breaks Treatment Details Compression Rate Down: 2.0 psi / minute De-Compression Rate Up: 2.0 psi / minute A breaks and breathing ir Compress Tx Pressure periods Decompress Decompress Begins Reached (leave unused spaces Begins Ends blank) Chamber Pressure (ATA 1 2.5 2.5 2.5 2.5 2.5 - - 2.5 1 ) Clock Time (24 hr) 09:42 09:56 10:26 10:31 11:01 11:06 - - 11:36 11:48 Treatment Length: 126 (minutes) Treatment Segments: 4 Vital Signs Capillary Blood Glucose Reference Range: 80 - 120 mg / dl HBO Diabetic Blood Glucose Intervention Range: <131 mg/dl or >366 mg/dl Time Vitals Blood Respiratory Capillary Blood Glucose Pulse Action Type: Pulse: Temperature: Taken: Pressure: Rate: Glucose (mg/dl): Meter #: Oximetry (%) Taken: Pre 09:37 137/82 75 16 97.2 232 Post 11:57 173/93 67 16 97.2 216 Treatment Response Treatment Toleration: Well Treatment Completion Status: Treatment Completed without Adverse Event Additional Procedure Documentation Tissue Sevierity:  Necrosis of bone Physician HBO Attestation: I certify that I supervised this HBO treatment in accordance with Medicare guidelines. A trained emergency response team is readily available per Yes hospital policies and procedures. Continue HBOT as ordered. Yes Electronic Signature(s) Signed: 11/23/2021 3:42:33 PM By: [name] MD FACS Previous Signature: 11/23/2021 2:56:59 PM Version By: [name] EMT Entered By: [name] on 11/23/2021 15:42:32 -------------------------------------------------------------------------------- HBO Safety Checklist Details Patient Name: Date of Service: [name] A. 11/23/2021 10:00 A M Medical Record Number: [id number] Patient Account Number: [id number] Date of Birth/Sex: Treating RN: [birth year] (66 y.o. [name] Primary Care Nobuko Gsell: [name] Other Clinician: [name] Referring Allina Riches: Treating Mansel Strother/Extender: [name]: 12 HBO Safety Checklist Items Safety Checklist Consent Form Signed Patient voided / foley secured and emptied When did you last eato 0630 Last dose of injectable or oral agent 0630 Ostomy pouch emptied and vented if applicable NA All implantable devices assessed, documented and approved NA Intravenous access site secured and place NA Valuables secured Linens and cotton and cotton/polyester blend (less than 51% polyester) Personal oil-based products / skin lotions / body lotions removed Wigs or hairpieces removed NA Smoking or tobacco materials removed Books / newspapers / magazines / loose paper removed Cologne, aftershave, perfume and deodorant removed Jewelry removed (may wrap wedding band) NA Make-up removed NA Hair care products removed Battery operated devices (external) removed Heating patches and chemical warmers removed Titanium eyewear removed NA Nail polish cured greater than 10 hours NA Casting material cured greater than  10 hours NA Hearing aids removed NA Loose dentures or partials removed NA Prosthetics have been removed NA Patient demonstrates correct use of air break device (if applicable) Patient concerns have been addressed Patient grounding bracelet on and cord attached to chamber Specifics for  Inpatients (complete in addition to above) Medication sheet sent with patient NA Intravenous medications needed or due during therapy sent with patient NA Drainage tubes (e.g. nasogastric tube or chest tube secured and vented) NA Endotracheal or Tracheotomy tube secured NA Cuff deflated of air and inflated with saline NA Airway suctioned NA Notes Safety checklist was done before treatment started. Electronic Signature(s) Signed: 11/23/2021 2:54:52 PM By: [name] EMT Entered By: [name] on 11/23/2021 14:54:51

## 2021-11-24 ENCOUNTER — Encounter (HOSPITAL_BASED_OUTPATIENT_CLINIC_OR_DEPARTMENT_OTHER): Payer: BC Managed Care – PPO | Admitting: General Surgery

## 2021-11-24 DIAGNOSIS — M1990 Unspecified osteoarthritis, unspecified site: Secondary | ICD-10-CM | POA: Diagnosis not present

## 2021-11-24 DIAGNOSIS — E1169 Type 2 diabetes mellitus with other specified complication: Secondary | ICD-10-CM | POA: Diagnosis not present

## 2021-11-24 DIAGNOSIS — Z7982 Long term (current) use of aspirin: Secondary | ICD-10-CM | POA: Diagnosis not present

## 2021-11-24 DIAGNOSIS — E11610 Type 2 diabetes mellitus with diabetic neuropathic arthropathy: Secondary | ICD-10-CM | POA: Diagnosis not present

## 2021-11-24 DIAGNOSIS — B372 Candidiasis of skin and nail: Secondary | ICD-10-CM | POA: Diagnosis not present

## 2021-11-24 DIAGNOSIS — Z794 Long term (current) use of insulin: Secondary | ICD-10-CM | POA: Diagnosis not present

## 2021-11-24 DIAGNOSIS — T8131XA Disruption of external operation (surgical) wound, not elsewhere classified, initial encounter: Secondary | ICD-10-CM | POA: Diagnosis not present

## 2021-11-24 DIAGNOSIS — L97322 Non-pressure chronic ulcer of left ankle with fat layer exposed: Secondary | ICD-10-CM | POA: Diagnosis not present

## 2021-11-24 DIAGNOSIS — Z951 Presence of aortocoronary bypass graft: Secondary | ICD-10-CM | POA: Diagnosis not present

## 2021-11-24 DIAGNOSIS — Z7984 Long term (current) use of oral hypoglycemic drugs: Secondary | ICD-10-CM | POA: Diagnosis not present

## 2021-11-24 DIAGNOSIS — L97324 Non-pressure chronic ulcer of left ankle with necrosis of bone: Secondary | ICD-10-CM | POA: Diagnosis not present

## 2021-11-24 DIAGNOSIS — E11622 Type 2 diabetes mellitus with other skin ulcer: Secondary | ICD-10-CM | POA: Diagnosis not present

## 2021-11-24 DIAGNOSIS — E785 Hyperlipidemia, unspecified: Secondary | ICD-10-CM | POA: Diagnosis not present

## 2021-11-24 DIAGNOSIS — D649 Anemia, unspecified: Secondary | ICD-10-CM | POA: Diagnosis not present

## 2021-11-24 DIAGNOSIS — K219 Gastro-esophageal reflux disease without esophagitis: Secondary | ICD-10-CM | POA: Diagnosis not present

## 2021-11-24 DIAGNOSIS — E1140 Type 2 diabetes mellitus with diabetic neuropathy, unspecified: Secondary | ICD-10-CM | POA: Diagnosis not present

## 2021-11-24 DIAGNOSIS — M86672 Other chronic osteomyelitis, left ankle and foot: Secondary | ICD-10-CM | POA: Diagnosis not present

## 2021-11-24 DIAGNOSIS — Z9181 History of falling: Secondary | ICD-10-CM | POA: Diagnosis not present

## 2021-11-24 DIAGNOSIS — I252 Old myocardial infarction: Secondary | ICD-10-CM | POA: Diagnosis not present

## 2021-11-24 DIAGNOSIS — I10 Essential (primary) hypertension: Secondary | ICD-10-CM | POA: Diagnosis not present

## 2021-11-24 DIAGNOSIS — I2510 Atherosclerotic heart disease of native coronary artery without angina pectoris: Secondary | ICD-10-CM | POA: Diagnosis not present

## 2021-11-24 LAB — GLUCOSE, CAPILLARY
Glucose-Capillary: 248 mg/dL — ABNORMAL HIGH (ref 70–99)
Glucose-Capillary: 192 mg/dL — ABNORMAL HIGH (ref 70–99)

## 2021-11-24 NOTE — Progress Notes (Signed)
HONG, MORING ([id number]) Visit Report for 11/24/2021 Arrival Information Details Patient Name: Date of Service: NA [name] 11/24/2021 10:00 A M Medical Record Number: [id number] Patient Account Number: [id number] Date of Birth/Sex: Treating RN: Jun 22, 1955 (65 y.o. [name] Primary Care Janisha Bueso: [name] Other Clinician: [name] Referring Maame Dack: Treating Halley Shepheard/Extender: [name] in Treatment: 13 Visit Information History Since Last Visit All ordered tests and consults were completed: Yes Patient Arrived: Knee Scooter Added or deleted any medications: No Arrival Time: 09:24 Any new allergies or adverse reactions: No Accompanied By: Wife Had a fall or experienced change in No Transfer Assistance: None activities of daily living that may affect Patient Identification Verified: Yes risk of falls: Secondary Verification Process Completed: Yes Signs or symptoms of abuse/neglect since last visito No Patient Requires Transmission-Based Precautions: No Hospitalized since last visit: No Patient Has Alerts: Yes Pain Present Now: No Patient Alerts: Patient on Blood Thinner Electronic Signature(s) Signed: 11/24/2021 4:56:08 PM By: [name] EMT Entered By: [name] on 11/24/2021 16:56:08 -------------------------------------------------------------------------------- Encounter Discharge Information Details Patient Name: Date of Service: NA [name] 11/24/2021 10:00 A M Medical Record Number: [id number] Patient Account Number: [id number] Date of Birth/Sex: Treating RN: 1955-08-29 (66 y.o. [name] Primary Care Lanell Dubie: [name] Other Clinician: [name] Referring Ayat Drenning: Treating Cabell Lazenby/Extender: [name] in Treatment: 13 Encounter Discharge Information Items Discharge Condition: Stable Ambulatory Status: Crutches Discharge Destination:  Home Transportation: Ambulance Accompanied By: Wife Schedule Follow-up Appointment: Yes Clinical Summary of Care: Electronic Signature(s) Signed: 11/24/2021 5:02:29 PM By: [name] EMT Entered By: [name] on 11/24/2021 17:02:29 -------------------------------------------------------------------------------- Vitals Details Patient Name: Date of Service: NA [name], [name] 11/24/2021 10:00 A M Medical Record Number: [id number] Patient Account Number: [id number] Date of Birth/Sex: Treating RN: 06-22-1955 (66 y.o. [name] Primary Care Amarachukwu Lakatos: [name] Other Clinician: [name] Referring Coumba Kellison: Treating Caryl Fate/Extender: [name]: 13 Vital Signs Time Taken: 09:38 Temperature (F): 97.8 Height (in): 74 Pulse (bpm): 74 Weight (lbs): 186 Respiratory Rate (breaths/min): 16 Body Mass Index (BMI): 23.9 Blood Pressure (mmHg): 138/82 Capillary Blood Glucose (mg/dl): 681 Reference Range: 80 - 120 mg / dl Electronic Signature(s) Signed: 11/24/2021 4:57:36 PM By: [name] EMT Entered By: [name] on 11/24/2021 16:57:36

## 2021-11-24 NOTE — Progress Notes (Signed)
SAMYAR, KULLA ([id number]) Visit Report for 11/24/2021 HBO Details Patient Name: Date of Service: [name] 11/24/2021 10:00 A M Medical Record Number: [id number] Patient Account Number: [id number] Date of Birth/Sex: Treating RN: [birth year] (66 y.o. [name] Mention Primary Care [name]: [name] Other Clinician: Valeria [name] Referring [name]: Treating [name]/Extender: [name] Weeks in Treatment: 13 HBO Treatment Course Details Treatment Course Number: 1 Ordering [name]: [name] T Treatments Ordered: otal 80 HBO Treatment Start Date: 09/20/2021 HBO Indication: Diabetic Ulcer(s) of the Lower Extremity Standard/Conservative Wound Care tried and failed greater than or equal to 30 days HBO Treatment Details Treatment Number: 46 Patient Type: Outpatient Chamber Type: Monoplace Chamber Serial #: [id number] Treatment Protocol: 2.5 ATA with 90 minutes oxygen, with two 5 minute air breaks Treatment Details Compression Rate Down: 2.0 psi / minute De-Compression Rate Up: 2.0 psi / minute A breaks and breathing ir Compress Tx Pressure periods Decompress Decompress Begins Reached (leave unused spaces Begins Ends blank) Chamber Pressure (ATA 1 2.5 2.5 2.5 2.5 2.5 - - 2.5 1 ) Clock Time (24 hr) 09:45 09:56 10:26 10:31 11:01 11:06 - - 11:36 11:47 Treatment Length: 122 (minutes) Treatment Segments: 4 Vital Signs Capillary Blood Glucose Reference Range: 80 - 120 mg / dl HBO Diabetic Blood Glucose Intervention Range: <131 mg/dl or >249 mg/dl Time Vitals Blood Respiratory Capillary Blood Glucose Pulse Action Type: Pulse: Temperature: Taken: Pressure: Rate: Glucose (mg/dl): Meter #: Oximetry (%) Taken: Pre 09:38 138/82 74 16 97.8 248 Post 11:52 162/92 67 16 97.8 192 Treatment Response Treatment Toleration: Well Treatment Completion Status: Treatment Completed without Adverse Event Additional Procedure Documentation Tissue Sevierity:  Necrosis of bone Physician HBO Attestation: I certify that I supervised this HBO treatment in accordance with Medicare guidelines. A trained emergency response team is readily available per Yes hospital policies and procedures. Continue HBOT as ordered. Yes Electronic Signature(s) Signed: 11/24/2021 5:20:31 PM By: Colin Maudlin MD FACS Previous Signature: 11/24/2021 5:01:13 PM Version By: [name] EMT Entered By: [name] on 11/24/2021 17:20:30 -------------------------------------------------------------------------------- HBO Safety Checklist Details Patient Name: Date of Service: Colin Leach [name] 11/24/2021 10:00 A M Medical Record Number: [id number] Patient Account Number: [id number] Date of Birth/Sex: Treating RN: 11/25/1955 (66 y.o. [name] Mention Primary Care [name]: [name] Other Clinician: Valeria [name] Referring [name]: Treating [name]/Extender: [name] Weeks in Treatment: 13 HBO Safety Checklist Items Safety Checklist Consent Form Signed Patient voided / foley secured and emptied When did you last eato 0645 Last dose of injectable or oral agent 0645 Ostomy pouch emptied and vented if applicable NA All implantable devices assessed, documented and approved NA Intravenous access site secured and place NA Valuables secured Linens and cotton and cotton/polyester blend (less than 51% polyester) Personal oil-based products / skin lotions / body lotions removed Wigs or hairpieces removed NA Smoking or tobacco materials removed Books / newspapers / magazines / loose paper removed Cologne, aftershave, perfume and deodorant removed Jewelry removed (may wrap wedding band) NA Make-up removed NA Hair care products removed Battery operated devices (external) removed Heating patches and chemical warmers removed Titanium eyewear removed NA Nail polish cured greater than 10 hours NA Casting material cured greater than  10 hours NA Hearing aids removed NA Loose dentures or partials removed NA Prosthetics have been removed NA Patient demonstrates correct use of air break device (if applicable) Patient concerns have been addressed Patient grounding bracelet on and cord attached to chamber Specifics for  Inpatients (complete in addition to above) Medication sheet sent with patient NA Intravenous medications needed or due during therapy sent with patient NA Drainage tubes (e.g. nasogastric tube or chest tube secured and vented) NA Endotracheal or Tracheotomy tube secured NA Cuff deflated of air and inflated with saline NA Airway suctioned NA Notes Safety checklist was done before treatment started. Electronic Signature(s) Signed: 11/24/2021 4:59:10 PM By: [name] EMT Entered By: [name] on 11/24/2021 16:59:10

## 2021-11-24 NOTE — Progress Notes (Signed)
[name], [name] ([id number]) Visit Report for 11/24/2021 Problem List Details Patient Name: Date of Service: NA [name] 11/24/2021 10:00 A M Medical Record Number: [id number] Patient Account Number: [id number] Date of Birth/Sex: Treating RN: [birth year] (66 y.o. 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 11/24/2021 Medical Record Number: [id number] Patient Account Number: [id number] Date of Birth/Sex: Treating RN: June 11, 1956 (66 y.o. 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 Facility Procedures CPT4 Code: 89373428 Description: G0277-(Facility Use Only) HBOT full body chamber, , ICD-10 Diagnosis Description E11.621 Type 2 diabetes mellitus with foot ulcer 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 M86.672 Other chronic  osteomyelitis, left ankle and foot 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

## 2021-11-25 ENCOUNTER — Encounter (HOSPITAL_BASED_OUTPATIENT_CLINIC_OR_DEPARTMENT_OTHER): Payer: BC Managed Care – PPO | Admitting: General Surgery

## 2021-11-25 DIAGNOSIS — E11622 Type 2 diabetes mellitus with other skin ulcer: Secondary | ICD-10-CM | POA: Diagnosis not present

## 2021-11-25 DIAGNOSIS — I2510 Atherosclerotic heart disease of native coronary artery without angina pectoris: Secondary | ICD-10-CM | POA: Diagnosis not present

## 2021-11-25 DIAGNOSIS — T8131XA Disruption of external operation (surgical) wound, not elsewhere classified, initial encounter: Secondary | ICD-10-CM | POA: Diagnosis not present

## 2021-11-25 DIAGNOSIS — B372 Candidiasis of skin and nail: Secondary | ICD-10-CM | POA: Diagnosis not present

## 2021-11-25 DIAGNOSIS — E11610 Type 2 diabetes mellitus with diabetic neuropathic arthropathy: Secondary | ICD-10-CM | POA: Diagnosis not present

## 2021-11-25 DIAGNOSIS — Z951 Presence of aortocoronary bypass graft: Secondary | ICD-10-CM | POA: Diagnosis not present

## 2021-11-25 DIAGNOSIS — L97324 Non-pressure chronic ulcer of left ankle with necrosis of bone: Secondary | ICD-10-CM | POA: Diagnosis not present

## 2021-11-25 DIAGNOSIS — E1169 Type 2 diabetes mellitus with other specified complication: Secondary | ICD-10-CM | POA: Diagnosis not present

## 2021-11-25 DIAGNOSIS — M86672 Other chronic osteomyelitis, left ankle and foot: Secondary | ICD-10-CM | POA: Diagnosis not present

## 2021-11-25 LAB — GLUCOSE, CAPILLARY
Glucose-Capillary: 178 mg/dL — ABNORMAL HIGH (ref 70–99)
Glucose-Capillary: 140 mg/dL — ABNORMAL HIGH (ref 70–99)

## 2021-11-25 NOTE — Progress Notes (Addendum)
MELBOURNE, JAKUBIAK ([id number]) Visit Report for 11/25/2021 HBO Details Patient Name: Date of Service: NA Colin Nephew A. 11/25/2021 10:00 A M Medical Record Number: [id number] Patient Account Number: [id number] Date of Birth/Sex: Treating RN: [birth year] (66 y.o. [name] Primary Care [name]: [name] Other Clinician: [name] Referring [name]: Treating [name]/Extender: [name]: 13 HBO Treatment Course Details Treatment Course Number: 1 Ordering Amyria Komar: [name] T Treatments Ordered: otal 80 HBO Treatment Start Date: 09/20/2021 HBO Indication: Diabetic Ulcer(s) of the Lower Extremity Standard/Conservative Wound Care tried and failed greater than or equal to 30 days HBO Treatment Details Treatment Number: 47 Patient Type: Outpatient Chamber Type: Monoplace Chamber Serial #: [id number] Treatment Protocol: 2.5 ATA with 90 minutes oxygen, with two 5 minute air breaks Treatment Details Compression Rate Down: 2.0 psi / minute De-Compression Rate Up: 2.0 psi / minute A breaks and breathing ir Compress Tx Pressure periods Decompress Decompress Begins Reached (leave unused spaces Begins Ends blank) Chamber Pressure (ATA 1 2.5 2.5 2.5 2.5 2.5 - - 2.5 1 ) Clock Time (24 hr) 09:56 10:08 10:38 10:43 11:13 11:18 - - 11:48 11:59 Treatment Length: 123 (minutes) Treatment Segments: 4 Vital Signs Capillary Blood Glucose Reference Range: 80 - 120 mg / dl HBO Diabetic Blood Glucose Intervention Range: <131 mg/dl or >629 mg/dl Time Vitals Blood Respiratory Capillary Blood Glucose Pulse Action Type: Pulse: Temperature: Taken: Pressure: Rate: Glucose (mg/dl): Meter #: Oximetry (%) Taken: Pre 09:48 131/91 72 16 97.5 178 Post 12:02 173/95 67 16 97.5 140 Treatment Response Treatment Toleration: Well Treatment Completion Status: Treatment Completed without Adverse Event Additional Procedure Documentation Tissue Sevierity:  Necrosis of bone Physician HBO Attestation: I certify that I supervised this HBO treatment in accordance with Medicare guidelines. A trained emergency response team is readily available per Yes hospital policies and procedures. Continue HBOT as ordered. Yes Electronic Signature(s) Signed: 11/25/2021 3:53:16 PM By: [name] MD FACS Previous Signature: 11/25/2021 1:33:34 PM Version By: [name] EMT Previous Signature: 11/25/2021 1:33:20 PM Version By: [name] EMT Previous Signature: 11/25/2021 11:38:27 AM Version By: [name] MD FACS Previous Signature: 11/25/2021 11:30:16 AM Version By: [name] EMT Entered By: [name] on 11/25/2021 15:53:16 -------------------------------------------------------------------------------- HBO Safety Checklist Details Patient Name: Date of Service: NA [name] 11/25/2021 10:00 A M Medical Record Number: [id number] Patient Account Number: [id number] Date of Birth/Sex: Treating RN: 1955-11-17 (66 y.o. [name] Primary Care [name]: [name] Other Clinician: [name] Referring [name]: Treating [name]/Extender: [name]: 13 HBO Safety Checklist Items Safety Checklist Consent Form Signed Patient voided / foley secured and emptied When did you last eato 0715 Last dose of injectable or oral agent 0715 Ostomy pouch emptied and vented if applicable NA All implantable devices assessed, documented and approved NA Intravenous access site secured and place NA Valuables secured Linens and cotton and cotton/polyester blend (less than 51% polyester) Personal oil-based products / skin lotions / body lotions removed Wigs or hairpieces removed NA Smoking or tobacco materials removed Books / newspapers / magazines / loose paper removed Cologne, aftershave, perfume and deodorant removed Jewelry removed (may wrap wedding band) NA Make-up removed NA Hair  care products removed Battery operated devices (external) removed Heating patches and chemical warmers removed Titanium eyewear removed NA Nail polish cured greater than 10 hours NA Casting material cured greater than 10 hours NA Hearing aids removed NA Loose dentures or partials removed NA  Prosthetics have been removed NA Patient demonstrates correct use of air break device (if applicable) Patient concerns have been addressed Patient grounding bracelet on and cord attached to chamber Specifics for Inpatients (complete in addition to above) Medication sheet sent with patient NA Intravenous medications needed or due during therapy sent with patient NA Drainage tubes (e.g. nasogastric tube or chest tube secured and vented) NA Endotracheal or Tracheotomy tube secured NA Cuff deflated of air and inflated with saline NA Airway suctioned NA Notes Safety checklist was done before treatment started. Electronic Signature(s) Signed: 11/25/2021 11:21:33 AM By: [name] EMT Entered By: [name] on 11/25/2021 11:21:33

## 2021-11-25 NOTE — Progress Notes (Signed)
[name], [name] ([id number]) Visit Report for 11/25/2021 Problem List Details Patient Name: Date of Service: NA [name] 11/25/2021 10:00 A M Medical Record Number: [id number] Patient Account Number: [id number] Date of Birth/Sex: Treating RN: 11-25-1955 (66 y.o. 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 11/25/2021 Medical Record Number: [id number] Patient Account Number: [id number] Date of Birth/Sex: Treating RN: 1956-02-18 (66 y.o. 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 Facility Procedures CPT4 Code: 53614431 Description: G0277-(Facility Use Only) HBOT full body chamber, , ICD-10 Diagnosis Description E11.621 Type 2 diabetes mellitus with foot ulcer 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 M86.672 Other chronic  osteomyelitis, left ankle and foot 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

## 2021-11-25 NOTE — Progress Notes (Addendum)
ABDURRAHMAN, PETERSHEIM ([id number]) Visit Report for 11/25/2021 Arrival Information Details Patient Name: Date of Service: NA [name] 11/25/2021 10:00 A M Medical Record Number: [id number] Patient Account Number: [id number] Date of Birth/Sex: Treating RN: 08/03/1955 (65 y.o. [name] Primary Care Ludmila Ebarb: [name] Other Clinician: [name] Referring Malin Cervini: Treating Bernisha Verma/Extender: [name] in Treatment: 13 Visit Information History Since Last Visit All ordered tests and consults were completed: Yes Patient Arrived: Knee Scooter Added or deleted any medications: No Arrival Time: 09:33 Any new allergies or adverse reactions: No Accompanied By: Wife Had a fall or experienced change in No Transfer Assistance: None activities of daily living that may affect Patient Identification Verified: Yes risk of falls: Secondary Verification Process Completed: Yes Signs or symptoms of abuse/neglect since last visito No Patient Requires Transmission-Based Precautions: No Hospitalized since last visit: No Patient Has Alerts: Yes Implantable device outside of the clinic excluding No Patient Alerts: Patient on Blood Thinner cellular tissue based products placed in the center since last visit: Pain Present Now: No Electronic Signature(s) Signed: 11/25/2021 11:19:30 AM By: [name] EMT Entered By: [name] on 11/25/2021 11:19:29 -------------------------------------------------------------------------------- Encounter Discharge Information Details Patient Name: Date of Service: [name] 11/25/2021 10:00 A M Medical Record Number: [id number] Patient Account Number: [id number] Date of Birth/Sex: Treating RN: [birth year] (65 y.o. [name] Primary Care Keasha Malkiewicz: [name] Other Clinician: [name] Referring Gionni Vaca: Treating Maryn Freelove/Extender: [name] in Treatment: 13 Encounter  Discharge Information Items Discharge Condition: Stable Ambulatory Status: Knee Scooter Discharge Destination: Home Transportation: Private Auto Accompanied By: None Schedule Follow-up Appointment: Yes Clinical Summary of Care: Electronic Signature(s) Signed: 11/25/2021 1:35:06 PM By: [name] EMT Entered By: [name] on 11/25/2021 13:35:06 -------------------------------------------------------------------------------- Vitals Details Patient Name: Date of Service: NA RRO Colin Aris [name] 11/25/2021 10:00 A M Medical Record Number: [id number] Patient Account Number: [id number] Date of Birth/Sex: Treating RN: [birth year] (66 y.o. [name] Primary Care Jamyla Ard: [name] Other Clinician: [name] Referring Mabeline Varas: Treating Naria Abbey/Extender: [name]: 13 Vital Signs Time Taken: 09:48 Temperature (F): 97.5 Height (in): 74 Pulse (bpm): 72 Weight (lbs): 186 Respiratory Rate (breaths/min): 16 Body Mass Index (BMI): 23.9 Blood Pressure (mmHg): 131/91 Capillary Blood Glucose (mg/dl): 099 Reference Range: 80 - 120 mg / dl Electronic Signature(s) Signed: 11/25/2021 11:20:28 AM By: [name] EMT Entered By: [name] on 11/25/2021 11:20:28

## 2021-11-26 ENCOUNTER — Encounter (HOSPITAL_BASED_OUTPATIENT_CLINIC_OR_DEPARTMENT_OTHER): Payer: BC Managed Care – PPO | Admitting: General Surgery

## 2021-11-26 DIAGNOSIS — T8131XA Disruption of external operation (surgical) wound, not elsewhere classified, initial encounter: Secondary | ICD-10-CM | POA: Diagnosis not present

## 2021-11-26 DIAGNOSIS — Z951 Presence of aortocoronary bypass graft: Secondary | ICD-10-CM | POA: Diagnosis not present

## 2021-11-26 DIAGNOSIS — E11610 Type 2 diabetes mellitus with diabetic neuropathic arthropathy: Secondary | ICD-10-CM | POA: Diagnosis not present

## 2021-11-26 DIAGNOSIS — E1169 Type 2 diabetes mellitus with other specified complication: Secondary | ICD-10-CM | POA: Diagnosis not present

## 2021-11-26 DIAGNOSIS — K219 Gastro-esophageal reflux disease without esophagitis: Secondary | ICD-10-CM | POA: Diagnosis not present

## 2021-11-26 DIAGNOSIS — L97322 Non-pressure chronic ulcer of left ankle with fat layer exposed: Secondary | ICD-10-CM | POA: Diagnosis not present

## 2021-11-26 DIAGNOSIS — L97324 Non-pressure chronic ulcer of left ankle with necrosis of bone: Secondary | ICD-10-CM | POA: Diagnosis not present

## 2021-11-26 DIAGNOSIS — I10 Essential (primary) hypertension: Secondary | ICD-10-CM | POA: Diagnosis not present

## 2021-11-26 DIAGNOSIS — M86672 Other chronic osteomyelitis, left ankle and foot: Secondary | ICD-10-CM | POA: Diagnosis not present

## 2021-11-26 DIAGNOSIS — B372 Candidiasis of skin and nail: Secondary | ICD-10-CM | POA: Diagnosis not present

## 2021-11-26 DIAGNOSIS — E785 Hyperlipidemia, unspecified: Secondary | ICD-10-CM | POA: Diagnosis not present

## 2021-11-26 DIAGNOSIS — I252 Old myocardial infarction: Secondary | ICD-10-CM | POA: Diagnosis not present

## 2021-11-26 DIAGNOSIS — Z9181 History of falling: Secondary | ICD-10-CM | POA: Diagnosis not present

## 2021-11-26 DIAGNOSIS — Z7982 Long term (current) use of aspirin: Secondary | ICD-10-CM | POA: Diagnosis not present

## 2021-11-26 DIAGNOSIS — E1140 Type 2 diabetes mellitus with diabetic neuropathy, unspecified: Secondary | ICD-10-CM | POA: Diagnosis not present

## 2021-11-26 DIAGNOSIS — D649 Anemia, unspecified: Secondary | ICD-10-CM | POA: Diagnosis not present

## 2021-11-26 DIAGNOSIS — M1990 Unspecified osteoarthritis, unspecified site: Secondary | ICD-10-CM | POA: Diagnosis not present

## 2021-11-26 DIAGNOSIS — Z7984 Long term (current) use of oral hypoglycemic drugs: Secondary | ICD-10-CM | POA: Diagnosis not present

## 2021-11-26 DIAGNOSIS — E11622 Type 2 diabetes mellitus with other skin ulcer: Secondary | ICD-10-CM | POA: Diagnosis not present

## 2021-11-26 DIAGNOSIS — Z794 Long term (current) use of insulin: Secondary | ICD-10-CM | POA: Diagnosis not present

## 2021-11-26 DIAGNOSIS — I2510 Atherosclerotic heart disease of native coronary artery without angina pectoris: Secondary | ICD-10-CM | POA: Diagnosis not present

## 2021-11-26 LAB — GLUCOSE, CAPILLARY
Glucose-Capillary: 188 mg/dL — ABNORMAL HIGH (ref 70–99)
Glucose-Capillary: 119 mg/dL — ABNORMAL HIGH (ref 70–99)

## 2021-11-26 NOTE — Progress Notes (Signed)
OLDEN, KLAUER ([id number]) Visit Report for 11/26/2021 Problem List Details Patient Name: Date of Service: NA [name] 11/26/2021 10:00 A M Medical Record Number: [id number] Patient Account Number: [id number] Date of Birth/Sex: Treating RN: 1955/09/02 (66 y.o. 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 11/26/2021 Medical Record Number: [id number] Patient Account Number: [id number] Date of Birth/Sex: Treating RN: [birth year] (66 y.o. 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 Facility Procedures CPT4 Code: [id number] Description: G0277-(Facility Use Only) HBOT full body chamber, , ICD-10 Diagnosis Description E11.621 Type 2 diabetes mellitus with foot ulcer 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 M86.672 Other chronic  osteomyelitis, left ankle and foot 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

## 2021-11-26 NOTE — Progress Notes (Addendum)
[name], SQUARE ([id number]) Visit Report for 11/26/2021 HBO Details Patient Name: Date of Service: NA Colin Nephew A. 11/26/2021 10:00 A M Medical Record Number: [id number] Patient Account Number: [id number] Date of Birth/Sex: Treating RN: 1955-07-08 (66 y.o. [name] Primary Care [name]: [name] Other Clinician: [name] Referring Deuce Paternoster: Treating [name]/Extender: [name] Weeks in Treatment: 13 HBO Treatment Course Details Treatment Course Number: 1 Ordering [name]: [name] T Treatments Ordered: otal 80 HBO Treatment Start Date: 09/20/2021 HBO Indication: Diabetic Ulcer(s) of the Lower Extremity Standard/Conservative Wound Care tried and failed greater than or equal to 30 days HBO Treatment Details Treatment Number: 48 Patient Type: Outpatient Chamber Type: Monoplace Chamber Serial #: [id number] Treatment Protocol: 2.5 ATA with 90 minutes oxygen, with two 5 minute air breaks Treatment Details Compression Rate Down: 2.0 psi / minute De-Compression Rate Up: 2.0 psi / minute A breaks and breathing ir Compress Tx Pressure periods Decompress Decompress Begins Reached (leave unused spaces Begins Ends blank) Chamber Pressure (ATA 1 2.5 2.5 2.5 2.5 2.5 - - 2.5 1 ) Clock Time (24 hr) 09:41 09:56 10:26 10:31 11:01 11:06 - - 11:36 11:47 Treatment Length: 126 (minutes) Treatment Segments: 4 Vital Signs Capillary Blood Glucose Reference Range: 80 - 120 mg / dl HBO Diabetic Blood Glucose Intervention Range: <131 mg/dl or >093 mg/dl Time Vitals Blood Respiratory Capillary Blood Glucose Pulse Action Type: Pulse: Temperature: Taken: Pressure: Rate: Glucose (mg/dl): Meter #: Oximetry (%) Taken: Pre 09:36 112/74 80 16 97.3 188 Post 11:48 115/85 69 16 97.7 119 Treatment Response Treatment Toleration: Well Treatment Completion Status: Treatment Completed without Adverse Event Additional Procedure Documentation Tissue Sevierity:  Necrosis of bone Electronic Signature(s) Signed: 11/26/2021 11:55:14 AM By: [name] EMT Signed: 11/26/2021 2:17:12 PM By: Colin Guess MD FACS Previous Signature: 11/26/2021 11:52:23 AM Version By: Colin Guess MD FACS Previous Signature: 11/26/2021 11:40:30 AM Version By: [name] EMT Previous Signature: 11/26/2021 11:24:08 AM Version By: [name] EMT Entered By: [name] on 11/26/2021 11:55:13 -------------------------------------------------------------------------------- HBO Safety Checklist Details Patient Name: Date of Service: NA Colin Nephew A. 11/26/2021 10:00 A M Medical Record Number: [id number] Patient Account Number: [id number] Date of Birth/Sex: Treating RN: 06-Feb-1956 (66 y.o. [name] Primary Care [name]: [name] Other Clinician: [name] Referring [name]: Treating [name]/Extender: [name] Weeks in Treatment: 13 HBO Safety Checklist Items Safety Checklist Consent Form Signed Patient voided / foley secured and emptied When did you last eato 0715 Last dose of injectable or oral agent 0715 Ostomy pouch emptied and vented if applicable NA All implantable devices assessed, documented and approved NA Intravenous access site secured and place NA Valuables secured Linens and cotton and cotton/polyester blend (less than 51% polyester) Personal oil-based products / skin lotions / body lotions removed Wigs or hairpieces removed NA Smoking or tobacco materials removed Books / newspapers / magazines / loose paper removed Cologne, aftershave, perfume and deodorant removed Jewelry removed (may wrap wedding band) NA Make-up removed NA Hair care products removed Battery operated devices (external) removed Heating patches and chemical warmers removed Titanium eyewear removed NA Nail polish cured greater than 10 hours NA Casting material cured greater than 10 hours NA Hearing aids  removed NA Loose dentures or partials removed NA Prosthetics have been removed NA Patient demonstrates correct use of air break device (if applicable) Patient concerns have been addressed Patient grounding bracelet on and cord attached to chamber Specifics for Inpatients (complete in addition to above)  Medication sheet sent with patient NA Intravenous medications needed or due during therapy sent with patient NA Drainage tubes (e.g. nasogastric tube or chest tube secured and vented) NA Endotracheal or Tracheotomy tube secured NA Cuff deflated of air and inflated with saline NA Airway suctioned NA Notes Safety checklist was done before treatment started. Electronic Signature(s) Signed: 11/26/2021 11:23:11 AM By: [name] EMT Entered By: [name] on 11/26/2021 11:23:11

## 2021-11-26 NOTE — Progress Notes (Signed)
Colin, [name] ([id number]) Visit Report for 11/26/2021 Chief Complaint Document Details Patient Name: Date of Service: NA [name] 11/26/2021 12:45 PM Medical Record Number: [id number] Patient Account Number: [id number] Date of Birth/Sex: Treating RN: 01-11-1956 (66 y.o. M) Primary Care Provider: [name] Other Clinician: Referring Provider: Treating Provider/Extender: [name] in Treatment: 13 Information Obtained from: Patient Chief Complaint Patients presents for treatment of an open diabetic ulcer with exposed bone and osteomyelitis Electronic Signature(s) Signed: 11/26/2021 1:02:55 PM By: [name] MD FACS Entered By: [name] on 11/26/2021 13:02:55 -------------------------------------------------------------------------------- Debridement Details Patient Name: Date of Service: NA Colin Nephew A. 11/26/2021 12:45 PM Medical Record Number: [id number] Patient Account Number: [id number] Date of Birth/Sex: Treating RN: [birth year] (66 y.o. [name] Primary Care Provider: [name] Other Clinician: Referring Provider: Treating Provider/Extender: [name] in Treatment: 13 Debridement Performed for Assessment: Wound #1 Left,Medial Foot Performed By: Physician [name], MD Debridement Type: Debridement Severity of Tissue Pre Debridement: Fat layer exposed Level of Consciousness (Pre-procedure): Awake and Alert Pre-procedure Verification/Time Out Yes - 12:50 Taken: Start Time: 12:50 T Area Debrided (L x W): otal 6.2 (cm) x 4.2 (cm) = 26.04 (cm) Tissue and other material debrided: Non-Viable, Slough, Subcutaneous, Skin: Epidermis, Slough Level: Skin/Subcutaneous Tissue Debridement Description: Excisional Instrument: Curette Bleeding: Minimum Hemostasis Achieved: Pressure Procedural Pain: 0 Post Procedural Pain: 0 Response to Treatment: Procedure was tolerated well Level of  Consciousness (Post- Awake and Alert procedure): Post Debridement Measurements of Total Wound Length: (cm) 6.2 Width: (cm) 4.2 Depth: (cm) 0.2 Volume: (cm) 4.09 Character of Wound/Ulcer Post Debridement: Improved Severity of Tissue Post Debridement: Fat layer exposed Post Procedure Diagnosis Same as Pre-procedure Electronic Signature(s) Signed: 11/26/2021 2:17:12 PM By: [name] MD FACS Signed: 11/26/2021 5:15:13 PM By: Samuella Bruin Entered By: Samuella Bruin on 11/26/2021 12:54:35 -------------------------------------------------------------------------------- HPI Details Patient Name: Date of Service: NA Colin [name] 11/26/2021 12:45 PM Medical Record Number: [id number] Patient Account Number: [id number] Date of Birth/Sex: Treating RN: 1955/10/21 (66 y.o. M) Primary Care Provider: [name] Other Clinician: Referring Provider: Treating Provider/Extender: [name] in Treatment: 13 History of Present Illness HPI Description: ADMISSION 08/25/2021 This is a 66 year old man who initially presented to his primary care provider in September 2022 with pain in his left foot. He was sent for an x-ray and while the x-ray was being performed, the tech pointed out a wound on his foot that the patient was not aware existed. He does have type 2 diabetes with significant neuropathy. His diabetes is suboptimally controlled with his most recent A1c being 8.5. He also has a history of coronary artery disease status post three- vessel CABG. he was initially seen by orthopedics, but they referred him to [facility]. He has undergone at least 7 operations/debridements and several applications of skin substitute under the care of podiatry. He has been in a wound VAC for much of this time. His most recent procedure was July 28, 2021. A portion of the talus was biopsied and was found to be consistent with osteomyelitis. Culture also  returned positive for corynebacterium. He was seen on August 16, 2021 by infectious disease. A PICC line has been placed and he will be receiving a 6-week course of IV daptomycin and cefepime. In October 2022, he underwent lower extremity vascular studies. Results are copied here: Right: Resting right ankle-brachial index is within normal range. No evidence of significant  right lower extremity arterial disease. The right toe-brachial index is abnormal. Left: Resting left ankle-brachial index indicates mild left lower extremity arterial disease. The left toe-brachial index is abnormal. He has not been seen by vascular surgery despite these findings. He presented to clinic today in a cam boot and is using a knee scooter to offload. Wound VAC was in place. Once this was removed, a large ulcer was identified on the left midfoot/ankle. Bone is frankly exposed. There is no malodorous or purulent drainage. There is some granulation tissue over the central portion of the exposed bone. There is a tunnel that extends posteriorly for roughly 10 cm. It has been discussed with him by multiple providers that he is at very high risk of losing his lower leg because of this wound. He is extremely eager to avoid this outcome and is here today to review his options as well as receive ongoing wound care. 09/03/2021: Here for reevaluation of his wound. There does not appear to have been any substantial improvement overall since our last visit. He has been in a wound VAC with white foam overlying the exposed bone. We are working on getting him approved for hyperbaric oxygen therapy. 09/10/2021: We are in the process of getting him cleared to begin hyperbaric oxygen therapy. He still needs to obtain a chest x-ray. Although the wound measurements are roughly the same, I think the overall appearance of the wound is better. The exposed bone has a bit more granulation tissue covering it. He has not received a vascular surgery  appointment to reevaluate his flow to the wound. 09/17/2021: He has been approved for hyperbaric oxygen therapy and completed his chest x-ray, which I reviewed and it appears normal. The tunnels at the 12 and 10:00 positions are smaller. There is more granulation tissue covering the exposed bone and the undermining has decreased. He still has not received a vascular surgery appointment. 09/24/2021: He initiated hyperbaric oxygen therapy this week and is tolerating it well. He has an appointment with vascular surgery coming up on May 16. The granulation tissue is covering more of the exposed bone and both tunnels are a bit smaller. 10/01/2021: He continues to tolerate hyperbaric oxygen therapy. He saw infectious disease and they are planning to pull his PICC line. He has been initiated on oral antibiotics (doxycycline and Augmentin). The wound looks about the same but the tunnels are a little bit smaller. The skin seems to be contracting somewhat around the exposed bone. 10/08/2021: The wound is still about the same size, but the tunnels continue to come in and the skin is contracting around the exposed bone. He continues to have some accumulation of necrotic material in the inferoposterior aspect of the wound as well as accumulation at the 12:00 tunnel area. 10/15/2021: The wound is smaller today. The tunnels continue to come in. There is less necrotic tissue present. He does have some periwound maceration. 10/22/2021: The wound is about the same size. There is a little bit less undermining at the distal portion. The exposed bone is dark and I am not sure if this is staining from silver nitrate or his VAC sponge or if it represents necrosis. The tunnels are shallower but he does have some serous drainage coming from the 10:00 tunnel. He continues to tolerate hyperbaric oxygen therapy well. 10/29/2021: The undermining continues to improve. The tunnels are about the same. He has good granulation tissue overlying  the majority of the exposed bone. It does appear that perhaps the tubing  from his wound VAC has been eroding the skin at the 12 clock position. He continues to accumulate senescent epithelium around the borders of the wound. 11/05/2021: The undermining is almost completely resolved. The tunnels have contracted fairly significantly. No significant slough or debris accumulation. There is still senescent epithelium accumulation around the borders of the wound. He has been tolerating hyperbaric oxygen therapy well. 11/12/2021: Despite the measurements of the wound being about the same, the wound has changed in its shape and overall, I think it is improved. The undermining has resolved and the tunnels continue to shorten. There is good granulation tissue encroaching over the small area of bone that has remained exposed at the 12 o'clock position. Minimal slough accumulation. He continues to tolerate hyperbaric oxygen therapy well. 11/19/2021: I took a PCR culture last week. There was overgrowth of yeast. He is already taking suppressive doxycycline and Augmentin. I added fluconazole to his regimen. The wound is smaller again today. The tunnels continue to shorten. He continues to do well with hyperbaric oxygen therapy. 11/26/2021: For some reason, his foot has become macerated. The wound is narrower but about the same dimensions in its longitudinal aspect. The tunnels continue to shorten. He has some slough buildup on the wound as well as some heaped up senescent epithelium around the perimeter. Electronic Signature(s) Signed: 11/26/2021 1:04:05 PM By: [name] MD FACS Entered By: [name] on 11/26/2021 13:04:05 -------------------------------------------------------------------------------- Physical Exam Details Patient Name: Date of Service: NA [name] 11/26/2021 12:45 PM Medical Record Number: [id number] Patient Account Number: [id number] Date of Birth/Sex: Treating RN: Apr 19, 1956  (66 y.o. M) Primary Care Provider: [name] Other Clinician: Referring Provider: Treating Provider/Extender: [name]: 13 Constitutional . . . . No acute distress.Marland Kitchen Respiratory Normal work of breathing on room air.. Notes 11/26/2021: For some reason, his foot has become macerated. The wound is narrower but about the same dimensions in its longitudinal aspect. The tunnels continue to shorten. He has some slough buildup on the wound as well as some heaped up senescent epithelium around the perimeter. Electronic Signature(s) Signed: 11/26/2021 1:05:20 PM By: [name] MD FACS Entered By: [name] on 11/26/2021 13:05:20 -------------------------------------------------------------------------------- Physician Orders Details Patient Name: Date of Service: NA Colin Nephew A. 11/26/2021 12:45 PM Medical Record Number: [id number] Patient Account Number: [id number] Date of Birth/Sex: Treating RN: 1956-04-04 (66 y.o. 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 - Dr. Lady Gary - Room 2 Return A Bathing/ Shower/ Hygiene May shower with protection but do not get wound dressing(s) wet. Negative Presssure Wound Therapy Wound Vac to wound continuously at 178mm/hg pressure Black Foam White Foam - Apply white foam or saline moistened gauze to tunnel at 10:00 and  12:00 Other: - Apply silver collagen on wound bed, under black foam Home Health No change in wound care orders this week; continue Home Health for wound care. May utilize formulary equivalent dressing for wound treatment orders unless otherwise specified. Dressing changes to be completed by Home Health on  Monday / Wednesday / Friday except when patient has scheduled visit at [facility]. Other Home Health Orders/Instructions: [name]: Change vac 3x week Mon. Wed. and Fri. after wound care visit. Hyperbaric Oxygen Therapy Evaluate for HBO Therapy Indication: - Wagner 3 diabetic ulcer left foot If appropriate for treatment, begin HBOT per protocol: 2.5 ATA for 90 Minutes with 2 Five (5) Minute A Breaks ir Total Number of Treatments: - 11/05/2021 additional 40 treatments. One treatments per day (delivered Monday through Friday unless otherwise specified in Special Instructions below): Finger stick Blood Glucose Pre- and Post- HBOT Treatment. Follow Hyperbaric Oxygen Glycemia Protocol A frin (Oxymetazoline HCL) 0.05% nasal spray - 1 spray in both nostrils daily as needed prior to HBO treatment for difficulty clearing ears Wound Treatment Wound #1 - Foot Wound Laterality: Left, Medial Cleanser: Wound Cleanser 3 x Per Week/30 Days Discharge Instructions: Cleanse the wound with wound cleanser prior to applying a clean dressing using gauze sponges, not tissue or cotton balls. Peri-Wound Care: Skin Prep 3 x Per Week/30 Days Discharge Instructions: Use skin prep as directed Peri-Wound Care: Zinc Oxide Ointment 30g tube 3 x Per Week/30 Days Discharge Instructions: Apply Zinc Oxide to periwound with each dressing change Prim Dressing: Promogran Prisma Matrix, 4.34 (sq in) (silver collagen) 3 x Per Week/30 Days ary Discharge Instructions: Moisten collagen with saline or hydrogel Secondary Dressing: ABD Pad, 5x9 3 x Per Week/30 Days Discharge Instructions: In clinic. Home Health to apply  wound vac. Secondary Dressing: Woven Gauze Sponge, Non-Sterile 4x4 in 3 x Per Week/30 Days Discharge Instructions: In clinic. Home Health to apply wound vac. Secured With: [employer], 4.5x3.1 (in/yd) 3 x Per Week/30 Days Discharge Instructions: In clinic. Home Health to apply wound vac. Secured With: 53M Medipore H Soft Cloth Surgical T ape, 4 x 10 (in/yd) 3 x Per Week/30 Days Discharge Instructions: In clinic. Home Health to apply wound vac. GLYCEMIA INTERVENTIONS PROTOCOL PRE-HBO GLYCEMIA INTERVENTIONS ACTION INTERVENTION Obtain pre-HBO capillary blood glucose (ensure 1 physician order is in chart). A. Notify HBO physician and await physician orders. 2 If result is 70 mg/dl or below: B. If the result meets the hospital definition of a critical result, follow hospital policy. A. Give patient an 8 ounce Glucerna Shake, an 8 ounce Ensure, or 8 ounces of a Glucerna/Ensure equivalent dietary supplement*. B. Wait 30 minutes. If result is 71 mg/dl to 161 mg/dl: C. Retest patients capillary blood glucose (CBG). D. If result greater than or equal to 110 mg/dl, proceed with HBO. If result less than 110 mg/dl, notify HBO physician and consider holding HBO. If result is 131 mg/dl to 096 mg/dl: A. Proceed with HBO. A. Notify HBO physician and await physician orders. B. It is recommended to hold HBO and do If result is 250 mg/dl or greater: blood/urine ketone testing. C. If the result meets the hospital definition of a critical result, follow hospital policy. POST-HBO GLYCEMIA INTERVENTIONS ACTION INTERVENTION Obtain post HBO capillary blood glucose (ensure 1 physician order is in chart). A. Notify HBO physician and await physician orders. 2 If result is 70 mg/dl or below: B. If the result meets the hospital definition of a critical result, follow hospital policy. A. Give patient an 8 ounce Glucerna Shake, an 8 ounce Ensure, or 8 ounces of a Glucerna/Ensure  equivalent dietary supplement*. B. Wait 15 minutes for symptoms of If result is 71 mg/dl to 045 mg/dl: hypoglycemia (i.e. nervousness, anxiety, sweating, chills, clamminess, irritability, confusion, tachycardia or dizziness). C.  If patient asymptomatic, discharge patient. If patient symptomatic, repeat capillary blood glucose (CBG) and notify HBO physician. If result is 101 mg/dl to 454 mg/dl: A. Discharge patient. A. Notify HBO physician and await physician orders. B. It is recommended to do blood/urine ketone If result is 250 mg/dl or greater: testing. C. If the result meets the hospital definition of a critical result, follow hospital policy. *Juice or candies are NOT equivalent products. If patient refuses the Glucerna or Ensure, please consult the hospital dietitian for an appropriate substitute. Electronic Signature(s) Signed: 11/26/2021 2:17:12 PM By: [name] MD FACS Signed: 11/26/2021 5:15:13 PM By: Gelene Mink By: Samuella Bruin on 11/26/2021 13:37:17 -------------------------------------------------------------------------------- Problem List Details Patient Name: Date of Service: NA Colin Nephew A. 11/26/2021 12:45 PM Medical Record Number: [id number] Patient Account Number: [id number] Date of Birth/Sex: Treating RN: March 05, 1956 (66 y.o. M) Primary Care Provider: [name] Other Clinician: Referring Provider: Treating Provider/Extender: [name]: 13 Active Problems ICD-10 Encounter Code Description Active Date MDM Diagnosis [phone] Other chronic osteomyelitis, left ankle and foot 08/25/2021 No Yes 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 08/25/2021 No Yes E11.621 Type 2 diabetes mellitus with foot ulcer 08/25/2021 No Yes I25.10 Atherosclerotic heart disease of native coronary artery without angina pectoris 08/25/2021 No Yes L97.324 Non-pressure chronic ulcer of left ankle  with necrosis of bone 09/21/2021 No Yes Inactive Problems Resolved Problems Electronic Signature(s) Signed: 11/26/2021 1:00:27 PM By: [name] MD FACS Entered By: [name] on 11/26/2021 13:00:27 -------------------------------------------------------------------------------- Progress Note Details Patient Name: Date of Service: NA Colin [name] 11/26/2021 12:45 PM Medical Record Number: [id number] Patient Account Number: [id number] Date of Birth/Sex: Treating RN: 1956/03/18 (66 y.o. M) Primary Care Provider: [name] Other Clinician: Referring Provider: Treating Provider/Extender: [name] in Treatment: 13 Subjective Chief Complaint Information obtained from Patient Patients presents for treatment of an open diabetic ulcer with exposed bone and osteomyelitis History of Present Illness (HPI) ADMISSION 08/25/2021 This is a 66 year old man who initially presented to his primary care provider in September 2022 with pain in his left foot. He was sent for an x-ray and while the x-ray was being performed, the tech pointed out a wound on his foot that the patient was not aware existed. He does have type 2 diabetes with significant neuropathy. His diabetes is suboptimally controlled with his most recent A1c being 8.5. He also has a history of coronary artery disease status post three- vessel CABG. he was initially seen by orthopedics, but they referred him to [facility]. He has undergone at least 7 operations/debridements and several applications of skin substitute under the care of podiatry. He has been in a wound VAC for much of this time. His most recent procedure was July 28, 2021. A portion of the talus was biopsied and was found to be consistent with osteomyelitis. Culture also returned positive for corynebacterium. He was seen on August 16, 2021 by infectious disease. A PICC line has been placed and he will be receiving a  6-week course of IV daptomycin and cefepime. In October 2022, he underwent lower extremity vascular studies. Results are copied here: Right: Resting right ankle-brachial index is within normal range. No evidence of significant right lower extremity arterial disease. The right toe-brachial index is abnormal. Left: Resting left ankle-brachial index indicates mild left lower extremity arterial disease. The left toe-brachial index is abnormal. He has not been seen by vascular surgery  despite these findings. He presented to clinic today in a cam boot and is using a knee scooter to offload. Wound VAC was in place. Once this was removed, a large ulcer was identified on the left midfoot/ankle. Bone is frankly exposed. There is no malodorous or purulent drainage. There is some granulation tissue over the central portion of the exposed bone. There is a tunnel that extends posteriorly for roughly 10 cm. It has been discussed with him by multiple providers that he is at very high risk of losing his lower leg because of this wound. He is extremely eager to avoid this outcome and is here today to review his options as well as receive ongoing wound care. 09/03/2021: Here for reevaluation of his wound. There does not appear to have been any substantial improvement overall since our last visit. He has been in a wound VAC with white foam overlying the exposed bone. We are working on getting him approved for hyperbaric oxygen therapy. 09/10/2021: We are in the process of getting him cleared to begin hyperbaric oxygen therapy. He still needs to obtain a chest x-ray. Although the wound measurements are roughly the same, I think the overall appearance of the wound is better. The exposed bone has a bit more granulation tissue covering it. He has not received a vascular surgery appointment to reevaluate his flow to the wound. 09/17/2021: He has been approved for hyperbaric oxygen therapy and completed his chest x-ray, which I  reviewed and it appears normal. The tunnels at the 12 and 10:00 positions are smaller. There is more granulation tissue covering the exposed bone and the undermining has decreased. He still has not received a vascular surgery appointment. 09/24/2021: He initiated hyperbaric oxygen therapy this week and is tolerating it well. He has an appointment with vascular surgery coming up on May 16. The granulation tissue is covering more of the exposed bone and both tunnels are a bit smaller. 10/01/2021: He continues to tolerate hyperbaric oxygen therapy. He saw infectious disease and they are planning to pull his PICC line. He has been initiated on oral antibiotics (doxycycline and Augmentin). The wound looks about the same but the tunnels are a little bit smaller. The skin seems to be contracting somewhat around the exposed bone. 10/08/2021: The wound is still about the same size, but the tunnels continue to come in and the skin is contracting around the exposed bone. He continues to have some accumulation of necrotic material in the inferoposterior aspect of the wound as well as accumulation at the 12:00 tunnel area. 10/15/2021: The wound is smaller today. The tunnels continue to come in. There is less necrotic tissue present. He does have some periwound maceration. 10/22/2021: The wound is about the same size. There is a little bit less undermining at the distal portion. The exposed bone is dark and I am not sure if this is staining from silver nitrate or his VAC sponge or if it represents necrosis. The tunnels are shallower but he does have some serous drainage coming from the 10:00 tunnel. He continues to tolerate hyperbaric oxygen therapy well. 10/29/2021: The undermining continues to improve. The tunnels are about the same. He has good granulation tissue overlying the majority of the exposed bone. It does appear that perhaps the tubing from his wound VAC has been eroding the skin at the 12 clock position. He  continues to accumulate senescent epithelium around the borders of the wound. 11/05/2021: The undermining is almost completely resolved. The tunnels have contracted  fairly significantly. No significant slough or debris accumulation. There is still senescent epithelium accumulation around the borders of the wound. He has been tolerating hyperbaric oxygen therapy well. 11/12/2021: Despite the measurements of the wound being about the same, the wound has changed in its shape and overall, I think it is improved. The undermining has resolved and the tunnels continue to shorten. There is good granulation tissue encroaching over the small area of bone that has remained exposed at the 12 o'clock position. Minimal slough accumulation. He continues to tolerate hyperbaric oxygen therapy well. 11/19/2021: I took a PCR culture last week. There was overgrowth of yeast. He is already taking suppressive doxycycline and Augmentin. I added fluconazole to his regimen. The wound is smaller again today. The tunnels continue to shorten. He continues to do well with hyperbaric oxygen therapy. 11/26/2021: For some reason, his foot has become macerated. The wound is narrower but about the same dimensions in its longitudinal aspect. The tunnels continue to shorten. He has some slough buildup on the wound as well as some heaped up senescent epithelium around the perimeter. Patient History Information obtained from Patient. Family History Cancer - Father, Diabetes - Father,Mother,Paternal Grandparents, Heart Disease - Father, Hypertension - Father, No family history of Hereditary Spherocytosis, Kidney Disease, Lung Disease, Seizures, Stroke, Thyroid Problems, Tuberculosis. Social History Never smoker, Marital Status - Married, Alcohol Use - Rarely, Drug Use - No History, Caffeine Use - Daily. Medical History Eyes Patient has history of Cataracts - Removed 2008 Cardiovascular Patient has history of Coronary Artery Disease,  Hypertension, Myocardial Infarction, Peripheral Arterial Disease Endocrine Patient has history of Type II Diabetes Musculoskeletal Patient has history of Osteomyelitis Neurologic Patient has history of Neuropathy Medical A Surgical History Notes nd Cardiovascular Hypercholesterolemia Abnormal EKG CABG X3 2019 Gastrointestinal GERD Musculoskeletal Diabetic foot ulcer Objective Constitutional No acute distress.. Vitals Time Taken: 11:48 AM, Height: 74 in, Weight: 186 lbs, BMI: 23.9, Temperature: 97.7 F, Pulse: 69 bpm, Respiratory Rate: 16 breaths/min, Blood Pressure: 115/85 mmHg, Capillary Blood Glucose: 119 mg/dl. Respiratory Normal work of breathing on room air.. General Notes: 11/26/2021: For some reason, his foot has become macerated. The wound is narrower but about the same dimensions in its longitudinal aspect. The tunnels continue to shorten. He has some slough buildup on the wound as well as some heaped up senescent epithelium around the perimeter. Integumentary (Hair, Skin) Wound #1 status is Open. Original cause of wound was Gradually Appeared. The date acquired was: 02/22/2021. The wound has been in treatment 13 weeks. The wound is located on the Left,Medial Foot. The wound measures 6.2cm length x 4.2cm width x 0.2cm depth; 20.452cm^2 area and 4.09cm^3 volume. There is Fat Layer (Subcutaneous Tissue) exposed. There is no undermining noted, however, there is tunneling at 12:00 with a maximum distance of 0.5cm. There is additional tunneling and at 9:00 with a maximum distance of 0.7cm. There is a medium amount of serosanguineous drainage noted. The wound margin is distinct with the outline attached to the wound base. There is large (67-100%) red, pink granulation within the wound bed. There is a small (1-33%) amount of necrotic tissue within the wound bed including Adherent Slough. 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 There was a Excisional Skin/Subcutaneous Tissue Debridement with a total area of 26.04 sq cm performed by [name], MD. With the following instrument(s): Curette to remove Non-Viable tissue/material. Material removed includes Subcutaneous Tissue, Slough, and Skin: Epidermis. No specimens were taken. A time out was conducted at 12:50, prior to the start of the procedure. A Minimum amount of bleeding was controlled with Pressure. The procedure was tolerated well with a pain level of 0 throughout and a pain level of 0 following the procedure. Post Debridement Measurements: 6.2cm length x 4.2cm width x 0.2cm depth; 4.09cm^3 volume. Character of Wound/Ulcer Post Debridement is improved. Severity of Tissue Post Debridement is: Fat layer exposed. Post procedure Diagnosis Wound #1: Same as Pre-Procedure Plan Follow-up Appointments: Return Appointment in 1 week. - Dr. Lady Gary - Room 2 Bathing/ Shower/ Hygiene: May shower with protection but do not get wound dressing(s) wet. Negative Presssure Wound Therapy: Wound Vac to wound continuously at 155mm/hg pressure Black Foam White Foam - Apply white foam or saline moistened gauze to tunnel at 10:00 and 12:00 Other: - Apply silver collagen on wound bed, under black foam Home Health: No change in wound care orders this week; continue Home Health for wound care. May utilize formulary equivalent dressing for wound treatment orders unless otherwise specified. Dressing changes to be completed by Home Health on Monday /  Wednesday / Friday except when patient has scheduled visit at [facility]. Other Home Health Orders/Instructions: [name]: Change vac 3x week Mon. Wed. and Fri. after wound care visit. Hyperbaric Oxygen Therapy: Evaluate for HBO Therapy Indication: - Wagner 3 diabetic ulcer left foot If appropriate for treatment, begin HBOT per protocol: 2.5 ATA for 90 Minutes with 2 Five (5) Minute Air Breaks T Number of Treatments: - 11/05/2021 additional 40 treatments. otal One treatments per day (delivered Monday through Friday unless otherwise specified in Special Instructions below): Finger stick Blood Glucose Pre- and Post- HBOT Treatment. Follow Hyperbaric Oxygen Glycemia Protocol Afrin (Oxymetazoline HCL) 0.05% nasal spray - 1 spray in both nostrils daily as needed prior to HBO treatment for difficulty clearing ears WOUND #1: - Foot Wound Laterality: Left, Medial Cleanser: Wound Cleanser 3 x Per Week/30 Days Discharge Instructions: Cleanse the wound with wound cleanser prior to applying a clean dressing using gauze sponges, not tissue or cotton balls. Peri-Wound Care: Skin Prep 3 x Per Week/30 Days Discharge Instructions: Use skin prep as directed Peri-Wound Care: Zinc Oxide Ointment 30g tube 3 x Per Week/30 Days Discharge Instructions: Apply Zinc Oxide to periwound with each dressing change Prim Dressing: Promogran Prisma Matrix, 4.34 (sq in) (silver collagen) 3 x Per Week/30 Days ary Discharge Instructions: Moisten collagen with saline or hydrogel Prim Dressing: Wound Vac 3 x Per Week/30 Days ary 11/26/2021: For some reason, his foot has become macerated. The wound is narrower but about the same dimensions in its longitudinal aspect. The tunnels continue to shorten. He has some slough buildup on the wound as well as some heaped up senescent epithelium around the perimeter. I used a curette to debride slough and biofilm as well as senescent epithelium from the wound. Upon further  discussion, it seems that the moisture is likely secondary to the neoprene insert in his cast boot. I have asked him to avoid wearing the boot as much as possible and allow his skin to dry out. He will continue with his hyperbaric  oxygen therapy and I will see him again next week. Electronic Signature(s) Signed: 11/26/2021 1:07:06 PM By: [name] MD FACS Entered By: [name] on 11/26/2021 13:07:06 -------------------------------------------------------------------------------- HxROS Details Patient Name: Date of Service: NA Colin [name], [name] 11/26/2021 12:45 PM Medical Record Number: [id number] Patient Account Number: [id number] Date of Birth/Sex: Treating RN: 1955-12-06 (66 y.o. M) Primary Care Provider: [name] Other Clinician: Referring Provider: Treating Provider/Extender: [name]: 13 Information Obtained From Patient Eyes Medical History: Positive for: Cataracts - Removed 2008 Cardiovascular Medical History: Positive for: Coronary Artery Disease; Hypertension; Myocardial Infarction; Peripheral Arterial Disease Past Medical History Notes: Hypercholesterolemia Abnormal EKG CABG X3 2019 Gastrointestinal Medical History: Past Medical History Notes: GERD Endocrine Medical History: Positive for: Type II Diabetes Time with diabetes: 24 years Treated with: Insulin, Oral agents Blood sugar tested every day: Yes Tested : Musculoskeletal Medical History: Positive for: Osteomyelitis Past Medical History Notes: Diabetic foot ulcer Neurologic Medical History: Positive for: Neuropathy HBO Extended History Items Eyes: Cataracts Immunizations Pneumococcal Vaccine: Received Pneumococcal Vaccination: Yes Received Pneumococcal Vaccination On or After 60th Birthday: Yes Implantable Devices Yes Family and Social History Cancer: Yes - Father; Diabetes: Yes - Father,Mother,Paternal Grandparents; Heart Disease: Yes -  Father; Hereditary Spherocytosis: No; Hypertension: Yes - Father; Kidney Disease: No; Lung Disease: No; Seizures: No; Stroke: No; Thyroid Problems: No; Tuberculosis: No; Never smoker; Marital Status - Married; Alcohol Use: Rarely; Drug Use: No History; Caffeine Use: Daily; Financial Concerns: No; Food, Clothing or Shelter Needs: No; Support System Lacking: No; Transportation Concerns: No Electronic Signature(s) Signed: 11/26/2021 2:17:12 PM By: [name] MD FACS Entered By: [name] on 11/26/2021 13:04:51 -------------------------------------------------------------------------------- SuperBill Details Patient Name: Date of Service: NA Colin [name] 11/26/2021 Medical Record Number: [id number] Patient Account Number: [id number] Date of Birth/Sex: Treating RN: 1956/03/31 (66 y.o. 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 Facility Procedures CPT4 Code: [id number] Description: 11042 - DEB SUBQ TISSUE 20 SQ CM/< ICD-10 Diagnosis Description 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 Modifier: Quantity: 1 CPT4 Code: [id number] Description: 11045 - DEB SUBQ TISS EA ADDL 20CM ICD-10 Diagnosis Description 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 Modifier: Quantity: 1 Physician Procedures : CPT4 Code Description Modifier [id number] 99213 - WC PHYS LEVEL 3 - EST PT 25 ICD-10 Diagnosis Description L97.324 Non-pressure chronic ulcer of left  ankle with necrosis of bone M86.672 Other chronic osteomyelitis, left ankle and foot E11.621 Type 2  diabetes mellitus with foot ulcer E11.610 Type 2 diabetes mellitus with diabetic neuropathic arthropathy Quantity: 1 : [id number] 11042 - WC PHYS SUBQ TISS 20 SQ CM ICD-10 Diagnosis Description 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

## 2021-11-26 NOTE — Progress Notes (Addendum)
AMILLION, MACCHIA ([id number]) Visit Report for 11/26/2021 Arrival Information Details Patient Name: Date of Service: NA [name] 11/26/2021 10:00 A M Medical Record Number: [id number] Patient Account Number: [id number] Date of Birth/Sex: Treating RN: June 20, 1955 (66 y.o. Daphane Shepherd, Emeterio Reeve Primary Care Carolie Mcilrath: [name] Other Clinician: [name] Referring Lida Berkery: Treating Mennie Spiller/Extender: [name] in Treatment: 13 Visit Information History Since Last Visit All ordered tests and consults were completed: Yes Patient Arrived: Knee Scooter Added or deleted any medications: No Arrival Time: 09:18 Any new allergies or adverse reactions: No Accompanied By: Wife Had a fall or experienced change in No Transfer Assistance: None activities of daily living that may affect Patient Requires Transmission-Based Precautions: No risk of falls: Patient Has Alerts: Yes Signs or symptoms of abuse/neglect since last visito No Patient Alerts: Patient on Blood Thinner Hospitalized since last visit: No Implantable device outside of the clinic excluding No cellular tissue based products placed in the center since last visit: Pain Present Now: No Electronic Signature(s) Signed: 11/26/2021 11:21:12 AM By: [name] EMT Entered By: [name] on 11/26/2021 11:21:12 -------------------------------------------------------------------------------- Encounter Discharge Information Details Patient Name: Date of Service: NA [name] 11/26/2021 10:00 A M Medical Record Number: [id number] Patient Account Number: [id number] Date of Birth/Sex: Treating RN: 12-12-1955 (66 y.o. [name] Primary Care Eastyn Skalla: [name] Other Clinician: [name] Referring Halden Phegley: Treating Delila Kuklinski/Extender: [name] in Treatment: 13 Encounter Discharge Information Items Discharge Condition: Stable Ambulatory Status: Knee  Scooter Discharge Destination: Home Transportation: Private Auto Accompanied By: None Schedule Follow-up Appointment: Yes Clinical Summary of Care: Electronic Signature(s) Signed: 11/26/2021 12:03:13 PM By: [name] EMT Entered By: [name] on 11/26/2021 12:03:13 -------------------------------------------------------------------------------- Vitals Details Patient Name: Date of Service: NA [name] 11/26/2021 10:00 A M Medical Record Number: [id number] Patient Account Number: [id number] Date of Birth/Sex: Treating RN: 01-15-1956 (66 y.o. [name] Primary Care Martie Fulgham: [name] Other Clinician: [name] Referring Eufelia Veno: Treating Desirae Mancusi/Extender: [name]: 13 Vital Signs Time Taken: 09:36 Temperature (F): 97.3 Height (in): 74 Pulse (bpm): 80 Weight (lbs): 186 Respiratory Rate (breaths/min): 16 Body Mass Index (BMI): 23.9 Blood Pressure (mmHg): 112/74 Capillary Blood Glucose (mg/dl): 384 Reference Range: 80 - 120 mg / dl Electronic Signature(s) Signed: 11/26/2021 11:21:47 AM By: [name] EMT Entered By: [name] on 11/26/2021 11:21:47

## 2021-11-26 NOTE — Progress Notes (Signed)
DINESH, ULYSSE ([id number]) Visit Report for 11/26/2021 Arrival Information Details Patient Name: Date of Service: NA [name] 11/26/2021 12:45 PM Medical Record Number: [id number] Patient Account Number: [id number] Date of Birth/Sex: Treating RN: 03/04/1956 (66 y.o. [name] Primary Care Americus Scheurich: [name] Other Clinician: Referring Rubby Barbary: Treating Traniyah Hallett/Extender: [name] in Treatment: 65 Visit Information History Since Last Visit Added or deleted any medications: No Patient Arrived: Knee Scooter Any new allergies or adverse reactions: No Arrival Time: 12:31 Had a fall or experienced change in No Accompanied By: self activities of daily living that may affect Transfer Assistance: None risk of falls: Patient Identification Verified: Yes Signs or symptoms of abuse/neglect since last visito No Secondary Verification Process Completed: Yes Hospitalized since last visit: No Patient Requires Transmission-Based Precautions: No Implantable device outside of the clinic excluding No Patient Has Alerts: Yes cellular tissue based products placed in the center Patient Alerts: Patient on Blood Thinner since last visit: Has Dressing in Place as Prescribed: Yes Pain Present Now: No Electronic Signature(s) Signed: 11/26/2021 5:15:13 PM By: Adline Peals Entered By: Adline Peals on 11/26/2021 12:31:31 -------------------------------------------------------------------------------- Encounter Discharge Information Details Patient Name: Date of Service: [name] 11/26/2021 12:45 PM Medical Record Number: [id number] Patient Account Number: [id number] Date of Birth/Sex: Treating RN: Sep 28, 1955 (66 y.o. [name] Primary Care Darian Cansler: [name] Other Clinician: Referring Miriya Cloer: Treating Tanaisha Pittman/Extender: [name] in Treatment: 13 Encounter Discharge Information Items Post  Procedure Vitals Discharge Condition: Stable Temperature (F): 97.7 Ambulatory Status: Knee Scooter Pulse (bpm): 69 Discharge Destination: Home Respiratory Rate (breaths/min): 16 Transportation: Private Auto Blood Pressure (mmHg): 115/85 Accompanied By: self Schedule Follow-up Appointment: Yes Clinical Summary of Care: Patient Declined Electronic Signature(s) Signed: 11/26/2021 5:15:13 PM By: Adline Peals Entered By: Adline Peals on 11/26/2021 13:37:47 -------------------------------------------------------------------------------- Lower Extremity Assessment Details Patient Name: Date of Service: Colin Loveless A. 11/26/2021 12:45 PM Medical Record Number: [id number] Patient Account Number: [id number] Date of Birth/Sex: Treating RN: Jan 06, 1956 (66 y.o. [name] Primary Care Maleah Rabago: [name] Other Clinician: Referring Tiran Sauseda: Treating Ellen Mayol/Extender: [name] Weeks in Treatment: 13 Edema Assessment Assessed: Shirlyn Goltz: No] Patrice Paradise: No] Edema: [Left: Ye] [Right: s] Calf Left: Right: Point of Measurement: From Medial Instep 35.5 cm Ankle Left: Right: Point of Measurement: From Medial Instep 23.8 cm Vascular Assessment Pulses: Dorsalis Pedis Palpable: [Left:Yes] Electronic Signature(s) Signed: 11/26/2021 5:15:13 PM By: Adline Peals Entered By: Adline Peals on 11/26/2021 12:41:18 -------------------------------------------------------------------------------- Multi Wound Chart Details Patient Name: Date of Service: Colin Loveless A. 11/26/2021 12:45 PM Medical Record Number: [id number] Patient Account Number: [id number] Date of Birth/Sex: Treating RN: November 22, 1955 (66 y.o. M) Primary Care Neri Samek: [name] Other Clinician: Referring Zahriah Roes: Treating Latiqua Daloia/Extender: [name] Weeks in Treatment: 13 Vital Signs Height(in): 74 Capillary Blood Glucose(mg/dl): 119 Weight(lbs):  186 Pulse(bpm): 72 Body Mass Index(BMI): 23.9 Blood Pressure(mmHg): 115/85 Temperature(F): 97.7 Respiratory Rate(breaths/min): 16 Photos: [1:Left, Medial Foot] [N/A:N/A N/A] Wound Location: [1:Gradually Appeared] [N/A:N/A] Wounding Event: [1:Diabetic Wound/Ulcer of the Lower] [N/A:N/A] Primary Etiology: [1:Extremity Cataracts, Coronary Artery Disease, N/A] Comorbid History: [1:Hypertension, Myocardial Infarction, Peripheral Arterial Disease, Type II Diabetes, Osteomyelitis, Neuropathy 02/22/2021] [N/A:N/A] Date Acquired: [1:13] [N/A:N/A] Weeks of Treatment: [1:Open] [N/A:N/A] Wound Status: [1:No] [N/A:N/A] Wound Recurrence: [1:6.2x4.2x0.2] [N/A:N/A] Measurements L x W x D (cm) [1:20.452] [N/A:N/A] A (cm) : rea [1:4.09] [N/A:N/A] Volume (cm) : [1:-44.70%] [N/A:N/A] % Reduction in A [1:rea: 71.10%] [N/A:N/A] % Reduction  in Volume: [1:12] Position 1 (o'clock): [1:0.5] Maximum Distance 1 (cm): [1:9] Position 2 (o'clock): [1:0.7] Maximum Distance 2 (cm): [1:Yes] [N/A:N/A] Tunneling: [1:Grade 3] [N/A:N/A] Classification: [1:Medium] [N/A:N/A] Exudate A mount: [1:Serosanguineous] [N/A:N/A] Exudate Type: [1:red, brown] [N/A:N/A] Exudate Color: [1:Distinct, outline attached] [N/A:N/A] Wound Margin: [1:Large (67-100%)] [N/A:N/A] Granulation A mount: [1:Red, Pink] [N/A:N/A] Granulation Quality: [1:Small (1-33%)] [N/A:N/A] Necrotic A mount: [1:Fat Layer (Subcutaneous Tissue): Yes N/A] Exposed Structures: [1:Fascia: No Tendon: No Muscle: No Joint: No Bone: No Small (1-33%)] [N/A:N/A] Epithelialization: [1:Debridement - Excisional] [N/A:N/A] Debridement: Pre-procedure Verification/Time Out 12:50 [N/A:N/A] Taken: [1:Subcutaneous, Slough] [N/A:N/A] Tissue Debrided: [1:Skin/Subcutaneous Tissue] [N/A:N/A] Level: [1:26.04] [N/A:N/A] Debridement A (sq cm): [1:rea Curette] [N/A:N/A] Instrument: [1:Minimum] [N/A:N/A] Bleeding: [1:Pressure] [N/A:N/A] Hemostasis A chieved: [1:0]  [N/A:N/A] Procedural Pain: [1:0] [N/A:N/A] Post Procedural Pain: [1:Procedure was tolerated well] [N/A:N/A] Debridement Treatment Response: [1:6.2x4.2x0.2] [N/A:N/A] Post Debridement Measurements L x W x D (cm) [1:4.09] [N/A:N/A] Post Debridement Volume: (cm) [1:Debridement] [N/A:N/A] Treatment Notes Electronic Signature(s) Signed: 11/26/2021 1:02:49 PM By: [name] MD FACS Entered By: [name] on 11/26/2021 13:02:49 -------------------------------------------------------------------------------- Multi-Disciplinary Care Plan Details Patient Name: Date of Service: Colin Loveless A. 11/26/2021 12:45 PM Medical Record Number: [id number] Patient Account Number: [id number] Date of Birth/Sex: Treating RN: 03-Apr-1956 (66 y.o. [name] Primary Care Athea Haley: [name] Other Clinician: Referring Jahiem Franzoni: Treating Collier Bohnet/Extender: [name] in Treatment: 13 Multidisciplinary Care Plan reviewed with physician Active Inactive HBO Nursing Diagnoses: Anxiety related to feelings of confinement associated with the hyperbaric oxygen chamber Anxiety related to knowledge deficit of hyperbaric oxygen therapy and treatment procedures Discomfort related to temperature and humidity changes inside hyperbaric chamber Potential for barotraumas to ears, sinuses, teeth, and lungs or cerebral gas embolism related to changes in atmospheric pressure inside hyperbaric oxygen chamber Potential for oxygen toxicity seizures related to delivery of 100% oxygen at an increased atmospheric pressure Potential for pulmonary oxygen toxicity related to delivery of 100% oxygen at an increased atmospheric pressure Goals: Barotrauma will be prevented during HBO2 Date Initiated: 09/17/2021 T arget Resolution Date: 12/10/2021 Goal Status: Active Patient will tolerate the hyperbaric oxygen therapy treatment Date Initiated: 09/17/2021 T arget Resolution Date:  12/10/2021 Goal Status: Active Patient will tolerate the internal climate of the chamber Date Initiated: 09/17/2021 T arget Resolution Date: 12/10/2021 Goal Status: Active Patient/caregiver will verbalize understanding of HBO goals, rationale, procedures and potential hazards Date Initiated: 09/17/2021 T arget Resolution Date: 12/10/2021 Goal Status: Active Signs and symptoms of pulmonary oxygen toxicity will be recognized and promptly addressed Date Initiated: 09/17/2021 T arget Resolution Date: 12/10/2021 Goal Status: Active Signs and symptoms of seizure will be recognized and promptly addressed ; seizing patients will suffer no harm Date Initiated: 09/17/2021 T arget Resolution Date: 12/10/2021 Goal Status: Active Interventions: Administer decongestants, per physician orders, prior to HBO2 Administer the correct therapeutic gas delivery based on the patients needs and limitations, per physician order Assess and provide for patients comfort related to the hyperbaric environment and equalization of middle ear Assess for signs and symptoms related to adverse events, including but not limited to confinement anxiety, pneumothorax, oxygen toxicity and baurotrauma Assess patient for any history of confinement anxiety Assess patient's knowledge and expectations regarding hyperbaric medicine and provide education related to the hyperbaric environment, goals of treatment and prevention of adverse events Implement protocols to decrease risk of pneumothorax in high risk patients Notes: Nutrition Nursing Diagnoses: Impaired glucose control: actual or potential Goals: Patient/caregiver verbalizes understanding of need to maintain therapeutic glucose control per primary  care physician Date Initiated: 08/25/2021 Target Resolution Date: 12/10/2021 Goal Status: Active Interventions: Assess HgA1c results as ordered upon admission and as needed Provide education on elevated blood sugars and impact on wound  healing Notes: Osteomyelitis Nursing Diagnoses: Infection: osteomyelitis Knowledge deficit related to disease process and management Goals: Patient's osteomyelitis will resolve Date Initiated: 09/17/2021 Target Resolution Date: 12/10/2021 Goal Status: Active Interventions: Assess for signs and symptoms of osteomyelitis resolution every visit Provide education on osteomyelitis Treatment Activities: Surgical debridement : 09/17/2021 Systemic antibiotics : 09/17/2021 T ordered outside of clinic : 09/17/2021 est Notes: Wound/Skin Impairment Nursing Diagnoses: Impaired tissue integrity Goals: Patient/caregiver will verbalize understanding of skin care regimen Date Initiated: 08/25/2021 Target Resolution Date: 12/10/2021 Goal Status: Active Ulcer/skin breakdown will have a volume reduction of 30% by week 4 Date Initiated: 08/25/2021 Date Inactivated: 10/08/2021 Target Resolution Date: 10/20/2021 Goal Status: Met Interventions: Assess patient/caregiver ability to obtain necessary supplies Assess patient/caregiver ability to perform ulcer/skin care regimen upon admission and as needed Assess ulceration(s) every visit Provide education on ulcer and skin care Treatment Activities: Topical wound management initiated : 08/25/2021 Notes: Electronic Signature(s) Signed: 11/26/2021 5:15:13 PM By: Adline Peals Entered By: Adline Peals on 11/26/2021 12:32:59 -------------------------------------------------------------------------------- Pain Assessment Details Patient Name: Date of Service: Colin Loveless A. 11/26/2021 12:45 PM Medical Record Number: [id number] Patient Account Number: [id number] Date of Birth/Sex: Treating RN: 15-Nov-1955 (66 y.o. [name] Primary Care Adlee Paar: [name] Other Clinician: Referring Yoniel Arkwright: Treating Janavia Rottman/Extender: [name] Weeks in Treatment: 13 Active Problems Location of Pain Severity and Description of  Pain Patient Has Paino No Site Locations Rate the pain. Current Pain Level: 0 Pain Management and Medication Current Pain Management: Electronic Signature(s) Signed: 11/26/2021 5:15:13 PM By: Adline Peals Entered By: Adline Peals on 11/26/2021 12:32:00 -------------------------------------------------------------------------------- Patient/Caregiver Education Details Patient Name: Date of Service: NA [name] 6/16/2023andnbsp12:45 PM Medical Record Number: [id number] Patient Account Number: [id number] Date of Birth/Gender: Treating RN: 1955/09/14 (66 y.o. [name] Primary Care Physician: [name] Other Clinician: Referring Physician: Treating Physician/Extender: [name] in Treatment: 13 Education Assessment Education Provided To: Patient Education Topics Provided Wound/Skin Impairment: Methods: Explain/Verbal Responses: Reinforcements needed, State content correctly Electronic Signature(s) Signed: 11/26/2021 5:15:13 PM By: Adline Peals Entered By: Adline Peals on 11/26/2021 12:33:09 -------------------------------------------------------------------------------- Wound Assessment Details Patient Name: Date of Service: Colin Loveless A. 11/26/2021 12:45 PM Medical Record Number: [id number] Patient Account Number: [id number] Date of Birth/Sex: Treating RN: 1956/03/28 (66 y.o. [name] Primary Care Ponce Skillman: [name] Other Clinician: Referring Keshauna Degraffenreid: Treating Sharmon Cheramie/Extender: [name] Weeks in Treatment: 13 Wound Status Wound Number: 1 Primary Diabetic Wound/Ulcer of the Lower Extremity Etiology: Wound Location: Left, Medial Foot Wound Open Wounding Event: Gradually Appeared Status: Date Acquired: 02/22/2021 Comorbid Cataracts, Coronary Artery Disease, Hypertension, Myocardial Weeks Of Treatment: 13 History: Infarction, Peripheral Arterial Disease, Type  II Diabetes, Clustered Wound: No Osteomyelitis, Neuropathy Photos Wound Measurements Length: (cm) 6.2 Width: (cm) 4.2 Depth: (cm) 0.2 Area: (cm) 20.452 Volume: (cm) 4.09 % Reduction in Area: -44.7% % Reduction in Volume: 71.1% Epithelialization: Small (1-33%) Tunneling: Yes Location 1 Position (o'clock): 12 Maximum Distance: (cm) 0.5 Location 2 Position (o'clock): 9 Maximum Distance: (cm) 0.7 Undermining: No Wound Description Classification: Grade 3 Wound Margin: Distinct, outline attached Exudate Amount: Medium Exudate Type: Serosanguineous Exudate Color: red, brown Foul Odor After Cleansing: No Slough/Fibrino Yes Wound Bed Granulation Amount: Large (67-100%) Exposed Structure Granulation Quality: Red, Pink  Fascia Exposed: No Necrotic Amount: Small (1-33%) Fat Layer (Subcutaneous Tissue) Exposed: Yes Necrotic Quality: Adherent Slough Tendon Exposed: No Muscle Exposed: No Joint Exposed: No Bone Exposed: No Treatment Notes Wound #1 (Foot) Wound Laterality: Left, Medial Cleanser Wound Cleanser Discharge Instruction: Cleanse the wound with wound cleanser prior to applying a clean dressing using gauze sponges, not tissue or cotton balls. Peri-Wound Care Skin Prep Discharge Instruction: Use skin prep as directed Zinc Oxide Ointment 30g tube Discharge Instruction: Apply Zinc Oxide to periwound with each dressing change Topical Primary Dressing Promogran Prisma Matrix, 4.34 (sq in) (silver collagen) Discharge Instruction: Moisten collagen with saline or hydrogel Wound Vac Secondary Dressing Secured With Compression Wrap Compression Stockings Add-Ons Electronic Signature(s) Signed: 11/26/2021 5:15:13 PM By: Adline Peals Signed: 11/26/2021 5:15:13 PM By: Adline Peals Entered By: Adline Peals on 11/26/2021 12:46:10 -------------------------------------------------------------------------------- Vitals Details Patient Name: Date of Service: NA  Colin [name] 11/26/2021 12:45 PM Medical Record Number: [id number] Patient Account Number: [id number] Date of Birth/Sex: Treating RN: Sep 06, 1955 (66 y.o. [name] Primary Care Shacora Zynda: [name] Other Clinician: Referring Hosey Burmester: Treating Kreg Earhart/Extender: [name] Weeks in Treatment: 13 Vital Signs Time Taken: 11:48 Temperature (F): 97.7 Height (in): 74 Pulse (bpm): 69 Weight (lbs): 186 Respiratory Rate (breaths/min): 16 Body Mass Index (BMI): 23.9 Blood Pressure (mmHg): 115/85 Capillary Blood Glucose (mg/dl): 119 Reference Range: 80 - 120 mg / dl Electronic Signature(s) Signed: 11/26/2021 5:15:13 PM By: Adline Peals Entered By: Adline Peals on 11/26/2021 12:31:54

## 2021-11-27 DIAGNOSIS — T8131XA Disruption of external operation (surgical) wound, not elsewhere classified, initial encounter: Secondary | ICD-10-CM | POA: Diagnosis not present

## 2021-11-28 DIAGNOSIS — T8131XA Disruption of external operation (surgical) wound, not elsewhere classified, initial encounter: Secondary | ICD-10-CM | POA: Diagnosis not present

## 2021-11-29 ENCOUNTER — Encounter (HOSPITAL_BASED_OUTPATIENT_CLINIC_OR_DEPARTMENT_OTHER): Payer: BC Managed Care – PPO | Admitting: General Surgery

## 2021-11-29 DIAGNOSIS — E1169 Type 2 diabetes mellitus with other specified complication: Secondary | ICD-10-CM | POA: Diagnosis not present

## 2021-11-29 DIAGNOSIS — Z794 Long term (current) use of insulin: Secondary | ICD-10-CM | POA: Diagnosis not present

## 2021-11-29 DIAGNOSIS — Z7982 Long term (current) use of aspirin: Secondary | ICD-10-CM | POA: Diagnosis not present

## 2021-11-29 DIAGNOSIS — K219 Gastro-esophageal reflux disease without esophagitis: Secondary | ICD-10-CM | POA: Diagnosis not present

## 2021-11-29 DIAGNOSIS — Z951 Presence of aortocoronary bypass graft: Secondary | ICD-10-CM | POA: Diagnosis not present

## 2021-11-29 DIAGNOSIS — E1140 Type 2 diabetes mellitus with diabetic neuropathy, unspecified: Secondary | ICD-10-CM | POA: Diagnosis not present

## 2021-11-29 DIAGNOSIS — B372 Candidiasis of skin and nail: Secondary | ICD-10-CM | POA: Diagnosis not present

## 2021-11-29 DIAGNOSIS — L97324 Non-pressure chronic ulcer of left ankle with necrosis of bone: Secondary | ICD-10-CM | POA: Diagnosis not present

## 2021-11-29 DIAGNOSIS — I10 Essential (primary) hypertension: Secondary | ICD-10-CM | POA: Diagnosis not present

## 2021-11-29 DIAGNOSIS — I252 Old myocardial infarction: Secondary | ICD-10-CM | POA: Diagnosis not present

## 2021-11-29 DIAGNOSIS — D649 Anemia, unspecified: Secondary | ICD-10-CM | POA: Diagnosis not present

## 2021-11-29 DIAGNOSIS — Z7984 Long term (current) use of oral hypoglycemic drugs: Secondary | ICD-10-CM | POA: Diagnosis not present

## 2021-11-29 DIAGNOSIS — E11622 Type 2 diabetes mellitus with other skin ulcer: Secondary | ICD-10-CM | POA: Diagnosis not present

## 2021-11-29 DIAGNOSIS — M1990 Unspecified osteoarthritis, unspecified site: Secondary | ICD-10-CM | POA: Diagnosis not present

## 2021-11-29 DIAGNOSIS — L97322 Non-pressure chronic ulcer of left ankle with fat layer exposed: Secondary | ICD-10-CM | POA: Diagnosis not present

## 2021-11-29 DIAGNOSIS — M86672 Other chronic osteomyelitis, left ankle and foot: Secondary | ICD-10-CM | POA: Diagnosis not present

## 2021-11-29 DIAGNOSIS — T8131XA Disruption of external operation (surgical) wound, not elsewhere classified, initial encounter: Secondary | ICD-10-CM | POA: Diagnosis not present

## 2021-11-29 DIAGNOSIS — E785 Hyperlipidemia, unspecified: Secondary | ICD-10-CM | POA: Diagnosis not present

## 2021-11-29 DIAGNOSIS — Z9181 History of falling: Secondary | ICD-10-CM | POA: Diagnosis not present

## 2021-11-29 DIAGNOSIS — I2510 Atherosclerotic heart disease of native coronary artery without angina pectoris: Secondary | ICD-10-CM | POA: Diagnosis not present

## 2021-11-29 DIAGNOSIS — E11610 Type 2 diabetes mellitus with diabetic neuropathic arthropathy: Secondary | ICD-10-CM | POA: Diagnosis not present

## 2021-11-29 LAB — GLUCOSE, CAPILLARY
Glucose-Capillary: 192 mg/dL — ABNORMAL HIGH (ref 70–99)
Glucose-Capillary: 145 mg/dL — ABNORMAL HIGH (ref 70–99)

## 2021-11-29 NOTE — Progress Notes (Signed)
[name], [name] ([id number]) Visit Report for 11/29/2021 Problem List Details Patient Name: Date of Service: NA Colin Nephew A. 11/29/2021 1:00 PM Medical Record Number: [id number] Patient Account Number: [id number] Date of Birth/Sex: Treating RN: 01-31-1956 (66 y.o. 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 11/29/2021 Medical Record Number: [id number] Patient Account Number: [id number] Date of Birth/Sex: Treating RN: 25-Oct-1955 (66 y.o. 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 Facility Procedures CPT4 Code: 29518841 Description: G0277-(Facility Use Only) HBOT full body chamber, , ICD-10 Diagnosis Description E11.621 Type 2 diabetes mellitus with foot ulcer 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 M86.672 Other chronic  osteomyelitis, left ankle and foot 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

## 2021-11-29 NOTE — Progress Notes (Signed)
KEWAN, MCNEASE ([id number]) Visit Report for 11/29/2021 Arrival Information Details Patient Name: Date of Service: NA [name] 11/29/2021 1:00 PM Medical Record Number: [id number] Patient Account Number: [id number] Date of Birth/Sex: Treating RN: Oct 24, 1955 (66 y.o. [name], [name] Primary Care [name]: [name] Other Clinician: [name] Referring [name]: Treating [name]/Extender: [name] in Treatment: 13 Visit Information History Since Last Visit All ordered tests and consults were completed: Yes Patient Arrived: Knee Scooter Added or deleted any medications: No Arrival Time: 12:36 Any new allergies or adverse reactions: No Accompanied By: Wife Had a fall or experienced change in No Transfer Assistance: None activities of daily living that may affect Patient Identification Verified: Yes risk of falls: Secondary Verification Process Completed: Yes Signs or symptoms of abuse/neglect since last visito No Patient Requires Transmission-Based Precautions: No Hospitalized since last visit: No Patient Has Alerts: Yes Implantable device outside of the clinic excluding No Patient Alerts: Patient on Blood Thinner cellular tissue based products placed in the center since last visit: Pain Present Now: No Electronic Signature(s) Signed: 11/29/2021 4:56:07 PM By: [name] EMT Entered By: [name] on 11/29/2021 16:56:07 -------------------------------------------------------------------------------- Encounter Discharge Information Details Patient Name: Date of Service: [name] 11/29/2021 1:00 PM Medical Record Number: [id number] Patient Account Number: [id number] Date of Birth/Sex: Treating RN: March 11, 1956 (66 y.o. [name] Primary Care [name]: [name] Other Clinician: [name] Referring Shakeia Krus: Treating [name] Alling/Extender: [name] in Treatment: 13 Encounter Discharge  Information Items Discharge Condition: Stable Ambulatory Status: Knee Scooter Discharge Destination: Home Transportation: Private Auto Accompanied By: None Schedule Follow-up Appointment: Yes Clinical Summary of Care: Electronic Signature(s) Signed: 11/29/2021 5:00:38 PM By: [name] EMT Entered By: [name] on 11/29/2021 17:00:38 -------------------------------------------------------------------------------- Vitals Details Patient Name: Date of Service: NA [name], [name] 11/29/2021 1:00 PM Medical Record Number: [id number] Patient Account Number: [id number] Date of Birth/Sex: Treating RN: 03-14-1956 (66 y.o. [name], [name] Primary Care Clothilde Tippetts: [name] Other Clinician: [name] Referring Anhelica Fowers: Treating [name]/Extender: [name]: 13 Vital Signs Time Taken: 12:56 Temperature (F): 97.9 Height (in): 74 Pulse (bpm): 70 Weight (lbs): 186 Respiratory Rate (breaths/min): 16 Body Mass Index (BMI): 23.9 Blood Pressure (mmHg): 134/77 Capillary Blood Glucose (mg/dl): 751 Reference Range: 80 - 120 mg / dl Electronic Signature(s) Signed: 11/29/2021 4:56:41 PM By: [name] EMT Entered By: [name] on 11/29/2021 16:56:41

## 2021-11-29 NOTE — Progress Notes (Signed)
[name] ([id number]) Visit Report for 11/29/2021 HBO Details Patient Name: Date of Service: NA Colin Nephew A. 11/29/2021 1:00 PM Medical Record Number: [id number] Patient Account Number: [id number] Date of Birth/Sex: Treating RN: Colin 24, 1957 (66 y.o. [name], [name] Primary Care [name]: [name] Other Clinician: [name] Referring [name]: Treating [name]/Extender: [name] Weeks in Treatment: 13 HBO Treatment Course Details Treatment Course Number: 1 Ordering [name]: [name] T Treatments Ordered: otal 80 HBO Treatment Start Date: 09/20/2021 HBO Indication: Diabetic Ulcer(s) of the Lower Extremity Standard/Conservative Wound Care tried and failed greater than or equal to 30 days HBO Treatment Details Treatment Number: 49 Patient Type: Outpatient Chamber Type: Monoplace Chamber Serial #: [id number] Treatment Protocol: 2.5 ATA with 90 minutes oxygen, with two 5 minute air breaks Treatment Details Compression Rate Down: 2.0 psi / minute De-Compression Rate Up: 2.0 psi / minute A breaks and breathing ir Compress Tx Pressure periods Decompress Decompress Begins Reached (leave unused spaces Begins Ends blank) Chamber Pressure (ATA 1 2.5 2.5 2.5 2.5 2.5 - - 2.5 1 ) Clock Time (24 hr) 13:15 13:30 14:00 14:05 14:35 14:40 - - 15:10 15:20 Treatment Length: 125 (minutes) Treatment Segments: 4 Vital Signs Capillary Blood Glucose Reference Range: 80 - 120 mg / dl HBO Diabetic Blood Glucose Intervention Range: <131 mg/dl or >381 mg/dl Time Vitals Blood Respiratory Capillary Blood Glucose Pulse Action Type: Pulse: Temperature: Taken: Pressure: Rate: Glucose (mg/dl): Meter #: Oximetry (%) Taken: Pre 12:56 134/77 70 16 97.9 192 Post 15:23 156/87 66 16 97.6 145 Treatment Response Treatment Toleration: Well Treatment Completion Status: Treatment Completed without Adverse Event Additional Procedure Documentation Tissue Sevierity:  Necrosis of bone Physician HBO Attestation: I certify that I supervised this HBO treatment in accordance with Medicare guidelines. A trained emergency response team is readily available per Yes hospital policies and procedures. Continue HBOT as ordered. Yes Electronic Signature(s) Signed: 11/29/2021 5:02:42 PM By: Colin Guess MD FACS Previous Signature: 11/29/2021 4:59:19 PM Version By: [name] EMT Entered By: [name] on 11/29/2021 17:02:42 -------------------------------------------------------------------------------- HBO Safety Checklist Details Patient Name: Date of Service: NA Colin Nephew A. 11/29/2021 1:00 PM Medical Record Number: [id number] Patient Account Number: [id number] Date of Birth/Sex: Treating RN: Jan 02, 1956 (66 y.o. [name], [name] Primary Care Rindy Kollman: [name] Other Clinician: [name] Referring Kevin Mario: Treating Gennifer Potenza/Extender: [name] Weeks in Treatment: 13 HBO Safety Checklist Items Safety Checklist Consent Form Signed Patient voided / foley secured and emptied When did you last eato 0715 Last dose of injectable or oral agent 0715 Ostomy pouch emptied and vented if applicable NA All implantable devices assessed, documented and approved NA Intravenous access site secured and place NA Valuables secured Linens and cotton and cotton/polyester blend (less than 51% polyester) Personal oil-based products / skin lotions / body lotions removed Wigs or hairpieces removed NA Smoking or tobacco materials removed Books / newspapers / magazines / loose paper removed Cologne, aftershave, perfume and deodorant removed Jewelry removed (may wrap wedding band) NA Make-up removed NA Hair care products removed Battery operated devices (external) removed Heating patches and chemical warmers removed Titanium eyewear removed NA Nail polish cured greater than 10 hours NA Casting material cured greater than 10  hours NA Hearing aids removed NA Loose dentures or partials removed NA Prosthetics have been removed NA Patient demonstrates correct use of air break device (if applicable) Patient concerns have been addressed Patient grounding bracelet on and cord attached to chamber Specifics for Inpatients (complete  in addition to above) Medication sheet sent with patient NA Intravenous medications needed or due during therapy sent with patient NA Drainage tubes (e.g. nasogastric tube or chest tube secured and vented) NA Endotracheal or Tracheotomy tube secured NA Cuff deflated of air and inflated with saline NA Airway suctioned NA Notes Safety checklist was done before treatment started. Electronic Signature(s) Signed: 11/29/2021 4:57:36 PM By: [name] EMT Entered By: [name] on 11/29/2021 16:57:36

## 2021-11-30 ENCOUNTER — Encounter (HOSPITAL_BASED_OUTPATIENT_CLINIC_OR_DEPARTMENT_OTHER): Payer: BC Managed Care – PPO | Admitting: General Surgery

## 2021-11-30 DIAGNOSIS — T8131XA Disruption of external operation (surgical) wound, not elsewhere classified, initial encounter: Secondary | ICD-10-CM | POA: Diagnosis not present

## 2021-11-30 DIAGNOSIS — B372 Candidiasis of skin and nail: Secondary | ICD-10-CM | POA: Diagnosis not present

## 2021-11-30 DIAGNOSIS — Z951 Presence of aortocoronary bypass graft: Secondary | ICD-10-CM | POA: Diagnosis not present

## 2021-11-30 DIAGNOSIS — M86672 Other chronic osteomyelitis, left ankle and foot: Secondary | ICD-10-CM | POA: Diagnosis not present

## 2021-11-30 DIAGNOSIS — E1169 Type 2 diabetes mellitus with other specified complication: Secondary | ICD-10-CM | POA: Diagnosis not present

## 2021-11-30 DIAGNOSIS — E11610 Type 2 diabetes mellitus with diabetic neuropathic arthropathy: Secondary | ICD-10-CM | POA: Diagnosis not present

## 2021-11-30 DIAGNOSIS — I2510 Atherosclerotic heart disease of native coronary artery without angina pectoris: Secondary | ICD-10-CM | POA: Diagnosis not present

## 2021-11-30 DIAGNOSIS — L97324 Non-pressure chronic ulcer of left ankle with necrosis of bone: Secondary | ICD-10-CM | POA: Diagnosis not present

## 2021-11-30 DIAGNOSIS — E11622 Type 2 diabetes mellitus with other skin ulcer: Secondary | ICD-10-CM | POA: Diagnosis not present

## 2021-11-30 LAB — GLUCOSE, CAPILLARY
Glucose-Capillary: 125 mg/dL — ABNORMAL HIGH (ref 70–99)
Glucose-Capillary: 145 mg/dL — ABNORMAL HIGH (ref 70–99)
Glucose-Capillary: 187 mg/dL — ABNORMAL HIGH (ref 70–99)

## 2021-11-30 NOTE — Progress Notes (Signed)
Colin, [name] ([id number]) Visit Report for 11/30/2021 HBO Details Patient Name: Date of Service: NA [name] 11/30/2021 10:00 A M Medical Record Number: [id number] Patient Account Number: [id number] Date of Birth/Sex: Treating RN: September 10, 1955 (66 y.o. [name] Primary Care Ahmia Colford: [name] Other Clinician: [name] Referring Kaleya Douse: Treating Ticara Waner/Extender: [name]: 13 HBO Treatment Course Details Treatment Course Number: 1 Ordering Brandonlee Navis: [name] T Treatments Ordered: otal 80 HBO Treatment Start Date: 09/20/2021 HBO Indication: Diabetic Ulcer(s) of the Lower Extremity Standard/Conservative Wound Care tried and failed greater than or equal to 30 days HBO Treatment Details Treatment Number: 50 Patient Type: Outpatient Chamber Type: Monoplace Chamber Serial #: [id number] Treatment Protocol: 2.5 ATA with 90 minutes oxygen, with two 5 minute air breaks Treatment Details Compression Rate Down: 2.0 psi / minute De-Compression Rate Up: 2.0 psi / minute A breaks and breathing ir Compress Tx Pressure periods Decompress Decompress Begins Reached (leave unused spaces Begins Ends blank) Chamber Pressure (ATA 1 2.5 2.5 2.5 2.5 2.5 - - 2.5 1 ) Clock Time (24 hr) 10:30 10:43 11:14 11:19 11:49 11:54 - - 12:24 12:35 Treatment Length: 125 (minutes) Treatment Segments: 4 Vital Signs Capillary Blood Glucose Reference Range: 80 - 120 mg / dl HBO Diabetic Blood Glucose Intervention Range: <131 mg/dl or >540 mg/dl Type: Time Vitals Blood Pulse: Respiratory Capillary Blood Glucose Pulse Action Temperature: Taken: Pressure: Rate: Glucose (mg/dl): Meter #: Oximetry (%) Taken: Pre 10:01 142/76 75 16 97.4 125 Patient given 8 oz glucerna Post 12:40 156/92 70 16 97.3 187 Pre 10:28 145 Treatment Response Treatment Toleration: Well Treatment Completion Status: Treatment Completed without Adverse Event Additional  Procedure Documentation Tissue Sevierity: Necrosis of bone Physician HBO Attestation: I certify that I supervised this HBO treatment in accordance with Medicare guidelines. A trained emergency response team is readily available per Yes hospital policies and procedures. Continue HBOT as ordered. Yes Electronic Signature(s) Signed: 12/01/2021 7:45:28 AM By: [name] MD FACS Previous Signature: 11/30/2021 4:40:41 PM Version By: [name] EMT Entered By: [name] on 12/01/2021 07:45:28 -------------------------------------------------------------------------------- HBO Safety Checklist Details Patient Name: Date of Service: [name] 11/30/2021 10:00 A M Medical Record Number: [id number] Patient Account Number: [id number] Date of Birth/Sex: Treating RN: 1955-08-18 (66 y.o. [name] Primary Care Kdyn Vonbehren: [name] Other Clinician: [name] Referring Breslyn Abdo: Treating Kiandre Spagnolo/Extender: [name]: 13 HBO Safety Checklist Items Safety Checklist Consent Form Signed Patient voided / foley secured and emptied When did you last eato 0700 Last dose of injectable or oral agent 0700 Ostomy pouch emptied and vented if applicable NA All implantable devices assessed, documented and approved NA Intravenous access site secured and place NA Valuables secured NA Linens and cotton and cotton/polyester blend (less than 51% polyester) Personal oil-based products / skin lotions / body lotions removed Wigs or hairpieces removed NA Smoking or tobacco materials removed Books / newspapers / magazines / loose paper removed Cologne, aftershave, perfume and deodorant removed Jewelry removed (may wrap wedding band) NA Make-up removed NA Hair care products removed Battery operated devices (external) removed Heating patches and chemical warmers removed Titanium eyewear removed NA Nail polish cured greater than 10  hours NA Casting material cured greater than 10 hours NA Hearing aids removed NA Loose dentures or partials removed NA Prosthetics have been removed NA Patient demonstrates correct use of air break device (if applicable) Patient concerns have been addressed Patient grounding  bracelet on and cord attached to chamber Specifics for Inpatients (complete in addition to above) Medication sheet sent with patient NA Intravenous medications needed or due during therapy sent with patient NA Drainage tubes (e.g. nasogastric tube or chest tube secured and vented) NA Endotracheal or Tracheotomy tube secured NA Cuff deflated of air and inflated with saline NA Airway suctioned NA Electronic Signature(s) Signed: 11/30/2021 4:38:47 PM By: [name] EMT Entered By: [name] on 11/30/2021 16:38:47

## 2021-11-30 NOTE — Progress Notes (Signed)
DECKLAN, MAU ([id number]) Visit Report for 11/30/2021 Arrival Information Details Patient Name: Date of Service: NA [name] 11/30/2021 10:00 A M Medical Record Number: [id number] Patient Account Number: [id number] Date of Birth/Sex: Treating RN: [birth year] (66 y.o. [name] Primary Care Jenevie Casstevens: [name] Other Clinician: [name] Referring Serra Younan: Treating Ronee Ranganathan/Extender: [name] in Treatment: 13 Visit Information History Since Last Visit All ordered tests and consults were completed: Yes Patient Arrived: Knee Scooter Added or deleted any medications: No Arrival Time: 09:34 Any new allergies or adverse reactions: No Accompanied By: Wife Had a fall or experienced change in No Transfer Assistance: None activities of daily living that may affect Patient Identification Verified: Yes risk of falls: Patient Requires Transmission-Based Precautions: No Signs or symptoms of abuse/neglect since last visito No Patient Has Alerts: Yes Hospitalized since last visit: No Patient Alerts: Patient on Blood Thinner Implantable device outside of the clinic excluding No cellular tissue based products placed in the center since last visit: Pain Present Now: No Electronic Signature(s) Signed: 11/30/2021 4:43:10 PM By: [name] EMT Previous Signature: 11/30/2021 4:37:16 PM Version By: [name] EMT Entered By: [name] on 11/30/2021 16:43:10 -------------------------------------------------------------------------------- Encounter Discharge Information Details Patient Name: Date of Service: NA Colin Nephew A. 11/30/2021 10:00 A M Medical Record Number: [id number] Patient Account Number: [id number] Date of Birth/Sex: Treating RN: February 10, 1956 (66 y.o. [name] Primary Care Brinleigh Tew: [name] Other Clinician: [name] Referring Tyeson Tanimoto: Treating Runell Kovich/Extender: [name] in  Treatment: 13 Encounter Discharge Information Items Discharge Condition: Stable Ambulatory Status: Knee Scooter Discharge Destination: Home Transportation: Private Auto Accompanied By: Wife Schedule Follow-up Appointment: Yes Clinical Summary of Care: Electronic Signature(s) Signed: 11/30/2021 4:42:50 PM By: [name] EMT Previous Signature: 11/30/2021 4:42:03 PM Version By: [name] EMT Entered By: [name] on 11/30/2021 16:42:49 -------------------------------------------------------------------------------- Vitals Details Patient Name: Date of Service: NA Colin [name] 11/30/2021 10:00 A M Medical Record Number: [id number] Patient Account Number: [id number] Date of Birth/Sex: Treating RN: 1955/07/14 (66 y.o. [name] Primary Care Zaneta Lightcap: [name] Other Clinician: [name] Referring Sabria Florido: Treating Teofila Bowery/Extender: [name]: 13 Vital Signs Time Taken: 10:01 Temperature (F): 97.4 Height (in): 74 Pulse (bpm): 75 Weight (lbs): 186 Respiratory Rate (breaths/min): 16 Body Mass Index (BMI): 23.9 Blood Pressure (mmHg): 142/76 Capillary Blood Glucose (mg/dl): 371 Reference Range: 80 - 120 mg / dl Electronic Signature(s) Signed: 11/30/2021 4:37:54 PM By: [name] EMT Entered By: [name] on 11/30/2021 16:37:54

## 2021-12-01 ENCOUNTER — Encounter (HOSPITAL_BASED_OUTPATIENT_CLINIC_OR_DEPARTMENT_OTHER): Payer: BC Managed Care – PPO | Admitting: General Surgery

## 2021-12-01 DIAGNOSIS — L97324 Non-pressure chronic ulcer of left ankle with necrosis of bone: Secondary | ICD-10-CM | POA: Diagnosis not present

## 2021-12-01 DIAGNOSIS — T8131XA Disruption of external operation (surgical) wound, not elsewhere classified, initial encounter: Secondary | ICD-10-CM | POA: Diagnosis not present

## 2021-12-01 DIAGNOSIS — E11622 Type 2 diabetes mellitus with other skin ulcer: Secondary | ICD-10-CM | POA: Diagnosis not present

## 2021-12-01 DIAGNOSIS — E1140 Type 2 diabetes mellitus with diabetic neuropathy, unspecified: Secondary | ICD-10-CM | POA: Diagnosis not present

## 2021-12-01 DIAGNOSIS — E1169 Type 2 diabetes mellitus with other specified complication: Secondary | ICD-10-CM | POA: Diagnosis not present

## 2021-12-01 DIAGNOSIS — I10 Essential (primary) hypertension: Secondary | ICD-10-CM | POA: Diagnosis not present

## 2021-12-01 DIAGNOSIS — B372 Candidiasis of skin and nail: Secondary | ICD-10-CM | POA: Diagnosis not present

## 2021-12-01 DIAGNOSIS — E785 Hyperlipidemia, unspecified: Secondary | ICD-10-CM | POA: Diagnosis not present

## 2021-12-01 DIAGNOSIS — I2510 Atherosclerotic heart disease of native coronary artery without angina pectoris: Secondary | ICD-10-CM | POA: Diagnosis not present

## 2021-12-01 DIAGNOSIS — Z7984 Long term (current) use of oral hypoglycemic drugs: Secondary | ICD-10-CM | POA: Diagnosis not present

## 2021-12-01 DIAGNOSIS — I252 Old myocardial infarction: Secondary | ICD-10-CM | POA: Diagnosis not present

## 2021-12-01 DIAGNOSIS — M86672 Other chronic osteomyelitis, left ankle and foot: Secondary | ICD-10-CM | POA: Diagnosis not present

## 2021-12-01 DIAGNOSIS — Z9181 History of falling: Secondary | ICD-10-CM | POA: Diagnosis not present

## 2021-12-01 DIAGNOSIS — Z951 Presence of aortocoronary bypass graft: Secondary | ICD-10-CM | POA: Diagnosis not present

## 2021-12-01 DIAGNOSIS — Z794 Long term (current) use of insulin: Secondary | ICD-10-CM | POA: Diagnosis not present

## 2021-12-01 DIAGNOSIS — E11610 Type 2 diabetes mellitus with diabetic neuropathic arthropathy: Secondary | ICD-10-CM | POA: Diagnosis not present

## 2021-12-01 DIAGNOSIS — D649 Anemia, unspecified: Secondary | ICD-10-CM | POA: Diagnosis not present

## 2021-12-01 DIAGNOSIS — K219 Gastro-esophageal reflux disease without esophagitis: Secondary | ICD-10-CM | POA: Diagnosis not present

## 2021-12-01 DIAGNOSIS — M1990 Unspecified osteoarthritis, unspecified site: Secondary | ICD-10-CM | POA: Diagnosis not present

## 2021-12-01 DIAGNOSIS — L97322 Non-pressure chronic ulcer of left ankle with fat layer exposed: Secondary | ICD-10-CM | POA: Diagnosis not present

## 2021-12-01 DIAGNOSIS — Z7982 Long term (current) use of aspirin: Secondary | ICD-10-CM | POA: Diagnosis not present

## 2021-12-01 LAB — GLUCOSE, CAPILLARY
Glucose-Capillary: 154 mg/dL — ABNORMAL HIGH (ref 70–99)
Glucose-Capillary: 137 mg/dL — ABNORMAL HIGH (ref 70–99)

## 2021-12-01 NOTE — Progress Notes (Signed)
LUISALBERTO, BEEGLE ([id number]) Visit Report for 12/01/2021 Problem List Details Patient Name: Date of Service: NA [name] 12/01/2021 10:00 A M Medical Record Number: [id number] Patient Account Number: [id number] Date of Birth/Sex: Treating RN: [birth year] (66 y.o. 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 12/01/2021 Medical Record Number: [id number] Patient Account Number: [id number] Date of Birth/Sex: Treating RN: Aug 17, 1955 (66 y.o. 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 Facility Procedures CPT4 Code: [id number] Description: G0277-(Facility Use Only) HBOT full body chamber, , ICD-10 Diagnosis Description E11.621 Type 2 diabetes mellitus with foot ulcer 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 M86.672 Other chronic  osteomyelitis, left ankle and foot 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

## 2021-12-01 NOTE — Progress Notes (Signed)
KACI, FREEL ([id number]) Visit Report for 12/01/2021 Arrival Information Details Patient Name: Date of Service: NA [name] 12/01/2021 10:00 A M Medical Record Number: [id number] Patient Account Number: [id number] Date of Birth/Sex: Treating RN: May 12, 1956 (66 y.o. [name] Primary Care Raphael Espe: [name] Other Clinician: [name] Referring Dodge Ator: Treating Chavie Kolinski/Extender: [name] in Treatment: 14 Visit Information History Since Last Visit All ordered tests and consults were completed: Yes Patient Arrived: Knee Scooter Added or deleted any medications: No Arrival Time: 09:34 Any new allergies or adverse reactions: No Accompanied By: Wife Had a fall or experienced change in No Transfer Assistance: None activities of daily living that may affect Patient Identification Verified: Yes risk of falls: Secondary Verification Process Completed: Yes Signs or symptoms of abuse/neglect since last visito No Patient Requires Transmission-Based Precautions: No Hospitalized since last visit: No Patient Has Alerts: Yes Implantable device outside of the clinic excluding No Patient Alerts: Patient on Blood Thinner cellular tissue based products placed in the center since last visit: Pain Present Now: No Electronic Signature(s) Signed: 12/01/2021 2:15:01 PM By: [name] EMT Entered By: [name] on 12/01/2021 14:15:01 -------------------------------------------------------------------------------- Encounter Discharge Information Details Patient Name: Date of Service: NA [name] 12/01/2021 10:00 A M Medical Record Number: [id number] Patient Account Number: [id number] Date of Birth/Sex: Treating RN: [birth year] (66 y.o. [name] Primary Care Greenleigh Kauth: [name] Other Clinician: [name] Referring Anatole Apollo: Treating Delsy Etzkorn/Extender: [name] in Treatment: 14 Encounter  Discharge Information Items Discharge Condition: Stable Ambulatory Status: Knee Scooter Discharge Destination: Home Transportation: Private Auto Accompanied By: Wife Schedule Follow-up Appointment: Yes Clinical Summary of Care: Electronic Signature(s) Signed: 12/01/2021 2:21:00 PM By: [name] EMT Entered By: [name] on 12/01/2021 14:21:00 -------------------------------------------------------------------------------- Vitals Details Patient Name: Date of Service: NA [name] 12/01/2021 10:00 A M Medical Record Number: [id number] Patient Account Number: [id number] Date of Birth/Sex: Treating RN: [birth year] (66 y.o. [name] Primary Care Symphani Eckstrom: [name] Other Clinician: [name] Referring Tao Satz: Treating Waynetta Metheny/Extender: [name]: 14 Vital Signs Time Taken: 10:38 Temperature (F): 97.5 Height (in): 74 Pulse (bpm): 75 Weight (lbs): 186 Respiratory Rate (breaths/min): 16 Body Mass Index (BMI): 23.9 Blood Pressure (mmHg): 142/91 Capillary Blood Glucose (mg/dl): 628 Reference Range: 80 - 120 mg / dl Electronic Signature(s) Signed: 12/01/2021 2:15:34 PM By: [name] EMT Entered By: [name] on 12/01/2021 14:15:34

## 2021-12-01 NOTE — Progress Notes (Addendum)
[name], [name] ([id number]) Visit Report for 12/01/2021 HBO Details Patient Name: Date of Service: NA [name] 12/01/2021 10:00 A M Medical Record Number: [id number] Patient Account Number: [id number] Date of Birth/Sex: Treating RN: 11-05-1955 (66 y.o. [name] Primary Care Keyon Winnick: [name] Other Clinician: [name] Referring Dianah Pruett: Treating Sarabeth Benton/Extender: [name]: 14 HBO Treatment Course Details Treatment Course Number: 1 Ordering Chaunice Obie: [name] T Treatments Ordered: otal 80 HBO Treatment Start Date: 09/20/2021 HBO Indication: Diabetic Ulcer(s) of the Lower Extremity Standard/Conservative Wound Care tried and failed greater than or equal to 30 days HBO Treatment Details Treatment Number: 51 Patient Type: Outpatient Chamber Type: Monoplace Chamber Serial #: [id number] Treatment Protocol: 2.5 ATA with 90 minutes oxygen, with two 5 minute air breaks Treatment Details Compression Rate Down: 2.0 psi / minute De-Compression Rate Up: 2.0 psi / minute A breaks and breathing ir Compress Tx Pressure periods Decompress Decompress Begins Reached (leave unused spaces Begins Ends blank) Chamber Pressure (ATA 1 2.5 2.5 2.5 2.5 2.5 - - 2.5 1 ) Clock Time (24 hr) 10:41 10:53 11:23 11:28 11:58 12:03 - - 12:33 12:43 Treatment Length: 122 (minutes) Treatment Segments: 4 Vital Signs Capillary Blood Glucose Reference Range: 80 - 120 mg / dl HBO Diabetic Blood Glucose Intervention Range: <131 mg/dl or >938 mg/dl Time Vitals Blood Respiratory Capillary Blood Glucose Pulse Action Type: Pulse: Temperature: Taken: Pressure: Rate: Glucose (mg/dl): Meter #: Oximetry (%) Taken: Pre 10:38 142/91 75 16 97.5 154 Post 12:48 147/80 67 18 97.7 137 Treatment Response Treatment Toleration: Well Treatment Completion Status: Treatment Completed without Adverse Event Additional Procedure Documentation Tissue Sevierity:  Necrosis of bone Physician HBO Attestation: I certify that I supervised this HBO treatment in accordance with Medicare guidelines. A trained emergency response team is readily available per Yes hospital policies and procedures. Continue HBOT as ordered. Yes Electronic Signature(s) Signed: 12/01/2021 3:37:58 PM By: [name] MD FACS Previous Signature: 12/01/2021 2:19:25 PM Version By: [name] EMT Entered By: [name] on 12/01/2021 15:37:57 -------------------------------------------------------------------------------- HBO Safety Checklist Details Patient Name: Date of Service: [name] A. 12/01/2021 10:00 A M Medical Record Number: [id number] Patient Account Number: [id number] Date of Birth/Sex: Treating RN: 05-Sep-1955 (66 y.o. [name] Primary Care Chonte [name]: [name] Other Clinician: [name] Referring Novella Abraha: Treating Rochelle Nephew/Extender: [name]: 14 HBO Safety Checklist Items Safety Checklist Consent Form Signed Patient voided / foley secured and emptied When did you last eato 0715 Last dose of injectable or oral agent 0715 Ostomy pouch emptied and vented if applicable NA All implantable devices assessed, documented and approved NA Intravenous access site secured and place NA Valuables secured Linens and cotton and cotton/polyester blend (less than 51% polyester) Personal oil-based products / skin lotions / body lotions removed Wigs or hairpieces removed NA Smoking or tobacco materials removed Books / newspapers / magazines / loose paper removed Cologne, aftershave, perfume and deodorant removed Jewelry removed (may wrap wedding band) NA Make-up removed NA Hair care products removed Battery operated devices (external) removed Heating patches and chemical warmers removed Titanium eyewear removed NA Nail polish cured greater than 10 hours NA Casting material cured greater than  10 hours NA Hearing aids removed NA Loose dentures or partials removed NA Prosthetics have been removed NA Patient demonstrates correct use of air break device (if applicable) Patient concerns have been addressed Patient grounding bracelet on and cord attached to chamber Specifics for  Inpatients (complete in addition to above) Medication sheet sent with patient NA Intravenous medications needed or due during therapy sent with patient NA Drainage tubes (e.g. nasogastric tube or chest tube secured and vented) NA Endotracheal or Tracheotomy tube secured NA Cuff deflated of air and inflated with saline NA Airway suctioned NA Notes The safety checklist was done before the treatment was started. Electronic Signature(s) Signed: 12/01/2021 2:17:26 PM By: [name] EMT Entered By: [name] on 12/01/2021 14:17:25

## 2021-12-01 NOTE — Progress Notes (Signed)
[name], [name] ([id number]) Visit Report for 11/30/2021 Problem List Details Patient Name: Date of Service: NA [name] 11/30/2021 10:00 A M Medical Record Number: [id number] Patient Account Number: [id number] Date of Birth/Sex: Treating RN: June 13, 1956 (66 y.o. 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 11/30/2021 Medical Record Number: [id number] Patient Account Number: [id number] Date of Birth/Sex: Treating RN: 1955/08/27 (66 y.o. 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 Facility Procedures CPT4 Code: 91660600 Description: G0277-(Facility Use Only) HBOT full body chamber, , ICD-10 Diagnosis Description E11.621 Type 2 diabetes mellitus with foot ulcer 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 M86.672 Other chronic  osteomyelitis, left ankle and foot 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

## 2021-12-02 ENCOUNTER — Encounter (HOSPITAL_BASED_OUTPATIENT_CLINIC_OR_DEPARTMENT_OTHER): Payer: BC Managed Care – PPO | Admitting: General Surgery

## 2021-12-02 DIAGNOSIS — T8131XA Disruption of external operation (surgical) wound, not elsewhere classified, initial encounter: Secondary | ICD-10-CM | POA: Diagnosis not present

## 2021-12-02 DIAGNOSIS — Z951 Presence of aortocoronary bypass graft: Secondary | ICD-10-CM | POA: Diagnosis not present

## 2021-12-02 DIAGNOSIS — E1169 Type 2 diabetes mellitus with other specified complication: Secondary | ICD-10-CM | POA: Diagnosis not present

## 2021-12-02 DIAGNOSIS — I2510 Atherosclerotic heart disease of native coronary artery without angina pectoris: Secondary | ICD-10-CM | POA: Diagnosis not present

## 2021-12-02 DIAGNOSIS — B372 Candidiasis of skin and nail: Secondary | ICD-10-CM | POA: Diagnosis not present

## 2021-12-02 DIAGNOSIS — M86672 Other chronic osteomyelitis, left ankle and foot: Secondary | ICD-10-CM | POA: Diagnosis not present

## 2021-12-02 DIAGNOSIS — E11622 Type 2 diabetes mellitus with other skin ulcer: Secondary | ICD-10-CM | POA: Diagnosis not present

## 2021-12-02 DIAGNOSIS — L97324 Non-pressure chronic ulcer of left ankle with necrosis of bone: Secondary | ICD-10-CM | POA: Diagnosis not present

## 2021-12-02 DIAGNOSIS — E11610 Type 2 diabetes mellitus with diabetic neuropathic arthropathy: Secondary | ICD-10-CM | POA: Diagnosis not present

## 2021-12-02 LAB — GLUCOSE, CAPILLARY
Glucose-Capillary: 217 mg/dL — ABNORMAL HIGH (ref 70–99)
Glucose-Capillary: 182 mg/dL — ABNORMAL HIGH (ref 70–99)

## 2021-12-02 NOTE — Progress Notes (Signed)
[name], [name] ([id number]) Visit Report for 12/02/2021 Problem List Details Patient Name: Date of Service: NA [name] 12/02/2021 10:00 A M Medical Record Number: [id number] Patient Account Number: [id number] Date of Birth/Sex: Treating RN: 1955/11/28 (66 y.o. 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 12/02/2021 Medical Record Number: [id number] Patient Account Number: [id number] Date of Birth/Sex: Treating RN: 10-09-1955 (66 y.o. 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 Facility Procedures CPT4 Code: 08676195 Description: G0277-(Facility Use Only) HBOT full body chamber, , ICD-10 Diagnosis Description E11.621 Type 2 diabetes mellitus with foot ulcer 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 M86.672 Other chronic  osteomyelitis, left ankle and foot 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

## 2021-12-02 NOTE — Progress Notes (Addendum)
VIGNESH, WILLERT ([id number]) Visit Report for 12/02/2021 Arrival Information Details Patient Name: Date of Service: NA [name] 12/02/2021 10:00 A M Medical Record Number: [id number] Patient Account Number: [id number] Date of Birth/Sex: Treating RN: 26-Oct-1955 (66 y.o. [name] Primary Care Ahmari Duerson: [name] Other Clinician: [name] Referring Ara Mano: Treating Melville Engen/Extender: [name] in Treatment: 14 Visit Information History Since Last Visit All ordered tests and consults were completed: Yes Patient Arrived: Knee Scooter Added or deleted any medications: No Arrival Time: 09:42 Any new allergies or adverse reactions: No Accompanied By: Wife Had a fall or experienced change in No Transfer Assistance: None activities of daily living that may affect Patient Identification Verified: Yes risk of falls: Secondary Verification Process Completed: Yes Signs or symptoms of abuse/neglect since last visito No Patient Requires Transmission-Based Precautions: No Hospitalized since last visit: No Patient Has Alerts: Yes Implantable device outside of the clinic excluding No Patient Alerts: Patient on Blood Thinner cellular tissue based products placed in the center since last visit: Pain Present Now: No Electronic Signature(s) Signed: 12/02/2021 2:29:31 PM By: [name] EMT Entered By: [name] on 12/02/2021 14:29:31 -------------------------------------------------------------------------------- Encounter Discharge Information Details Patient Name: Date of Service: NA Colin [name] 12/02/2021 10:00 A M Medical Record Number: [id number] Patient Account Number: [id number] Date of Birth/Sex: Treating RN: [birth year] (66 y.o. [name] Primary Care Basia Mcginty: [name] Other Clinician: [name] Referring Ryn Peine: Treating Aundrea Horace/Extender: [name]: 14 Encounter  Discharge Information Items Discharge Condition: Stable Ambulatory Status: Knee Scooter Discharge Destination: Home Transportation: Private Auto Accompanied By: None Schedule Follow-up Appointment: Yes Clinical Summary of Care: Electronic Signature(s) Signed: 12/02/2021 2:34:53 PM By: [name] EMT Entered By: [name] on 12/02/2021 14:34:52 -------------------------------------------------------------------------------- Vitals Details Patient Name: Date of Service: NA Colin [name] 12/02/2021 10:00 A M Medical Record Number: [id number] Patient Account Number: [id number] Date of Birth/Sex: Treating RN: 1955-08-25 (66 y.o. [name] Primary Care Tyrail Grandfield: [name] Other Clinician: [name] Referring Stclair Szymborski: Treating Desiray Orchard/Extender: [name]: 14 Vital Signs Time Taken: 10:00 Temperature (F): 97.9 Height (in): 74 Pulse (bpm): 67 Weight (lbs): 186 Respiratory Rate (breaths/min): 16 Body Mass Index (BMI): 23.9 Blood Pressure (mmHg): 137/78 Capillary Blood Glucose (mg/dl): 194 Reference Range: 80 - 120 mg / dl Electronic Signature(s) Signed: 12/02/2021 2:30:01 PM By: [name] EMT Entered By: [name] on 12/02/2021 14:30:00

## 2021-12-02 NOTE — Progress Notes (Addendum)
PROSPERO, MAHNKE ([id number]) Visit Report for 12/02/2021 HBO Details Patient Name: Date of Service: NA [name] 12/02/2021 10:00 A M Medical Record Number: [id number] Patient Account Number: [id number] Date of Birth/Sex: Treating RN: December 06, 1955 (66 y.o. [name] Primary Care Hurshel Bouillon: [name] Other Clinician: [name] Referring Lorriann Hansmann: Treating Bettylou Frew/Extender: [name]: 14 HBO Treatment Course Details Treatment Course Number: 1 Ordering Korban Shearer: [name] T Treatments Ordered: otal 80 HBO Treatment Start Date: 09/20/2021 HBO Indication: Diabetic Ulcer(s) of the Lower Extremity Standard/Conservative Wound Care tried and failed greater than or equal to 30 days HBO Treatment Details Treatment Number: 52 Patient Type: Outpatient Chamber Type: Monoplace Chamber Serial #: [id number] Treatment Protocol: 2.5 ATA with 90 minutes oxygen, with two 5 minute air breaks Treatment Details Compression Rate Down: 2.0 psi / minute De-Compression Rate Up: 2.0 psi / minute A breaks and breathing ir Compress Tx Pressure periods Decompress Decompress Begins Reached (leave unused spaces Begins Ends blank) Chamber Pressure (ATA 1 2.5 2.5 2.5 2.5 2.5 - - 2.5 1 ) Clock Time (24 hr) 10:20 10:32 11:02 11:07 11:37 11:42 - - 12:12 12:26 Treatment Length: 126 (minutes) Treatment Segments: 4 Vital Signs Capillary Blood Glucose Reference Range: 80 - 120 mg / dl HBO Diabetic Blood Glucose Intervention Range: <131 mg/dl or >782 mg/dl Time Vitals Blood Respiratory Capillary Blood Glucose Pulse Action Type: Pulse: Temperature: Taken: Pressure: Rate: Glucose (mg/dl): Meter #: Oximetry (%) Taken: Pre 10:00 137/78 67 16 97.9 217 Post 12:29 114/86 63 16 97.7 182 Treatment Response Treatment Toleration: Well Treatment Completion Status: Treatment Completed without Adverse Event Additional Procedure Documentation Tissue Sevierity:  Necrosis of bone Physician HBO Attestation: I certify that I supervised this HBO treatment in accordance with Medicare guidelines. A trained emergency response team is readily available per Yes hospital policies and procedures. Continue HBOT as ordered. Yes Electronic Signature(s) Signed: 12/02/2021 4:06:41 PM By: [name] MD FACS Previous Signature: 12/02/2021 2:32:30 PM Version By: [name] EMT Entered By: [name] on 12/02/2021 16:06:40 -------------------------------------------------------------------------------- HBO Safety Checklist Details Patient Name: Date of Service: [name] A. 12/02/2021 10:00 A M Medical Record Number: [id number] Patient Account Number: [id number] Date of Birth/Sex: Treating RN: [birth year] (66 y.o. [name] Primary Care Cici Rodriges: [name] Other Clinician: [name] Referring Hennesy Sobalvarro: Treating Wandra Babin/Extender: [name]: 14 HBO Safety Checklist Items Safety Checklist Consent Form Signed Patient voided / foley secured and emptied When did you last eato 0800 Last dose of injectable or oral agent 0645 Ostomy pouch emptied and vented if applicable NA All implantable devices assessed, documented and approved NA Intravenous access site secured and place NA Valuables secured Linens and cotton and cotton/polyester blend (less than 51% polyester) Personal oil-based products / skin lotions / body lotions removed Wigs or hairpieces removed NA Smoking or tobacco materials removed Books / newspapers / magazines / loose paper removed Cologne, aftershave, perfume and deodorant removed Jewelry removed (may wrap wedding band) NA Make-up removed NA Hair care products removed Battery operated devices (external) removed Heating patches and chemical warmers removed Titanium eyewear removed NA Nail polish cured greater than 10 hours NA Casting material cured greater than  10 hours NA Hearing aids removed NA Loose dentures or partials removed NA Prosthetics have been removed NA Patient demonstrates correct use of air break device (if applicable) Patient concerns have been addressed Patient grounding bracelet on and cord attached to chamber Specifics for  Inpatients (complete in addition to above) Medication sheet sent with patient NA Intravenous medications needed or due during therapy sent with patient NA Drainage tubes (e.g. nasogastric tube or chest tube secured and vented) NA Endotracheal or Tracheotomy tube secured NA Cuff deflated of air and inflated with saline NA Airway suctioned NA Electronic Signature(s) Signed: 12/02/2021 2:31:09 PM By: [name] EMT Entered By: [name] on 12/02/2021 14:31:09

## 2021-12-03 ENCOUNTER — Encounter (HOSPITAL_BASED_OUTPATIENT_CLINIC_OR_DEPARTMENT_OTHER): Payer: BC Managed Care – PPO | Admitting: General Surgery

## 2021-12-03 DIAGNOSIS — I2510 Atherosclerotic heart disease of native coronary artery without angina pectoris: Secondary | ICD-10-CM | POA: Diagnosis not present

## 2021-12-03 DIAGNOSIS — L97523 Non-pressure chronic ulcer of other part of left foot with necrosis of muscle: Secondary | ICD-10-CM | POA: Diagnosis not present

## 2021-12-03 DIAGNOSIS — M86672 Other chronic osteomyelitis, left ankle and foot: Secondary | ICD-10-CM | POA: Diagnosis not present

## 2021-12-03 DIAGNOSIS — L97324 Non-pressure chronic ulcer of left ankle with necrosis of bone: Secondary | ICD-10-CM | POA: Diagnosis not present

## 2021-12-03 DIAGNOSIS — T8131XA Disruption of external operation (surgical) wound, not elsewhere classified, initial encounter: Secondary | ICD-10-CM | POA: Diagnosis not present

## 2021-12-03 DIAGNOSIS — B372 Candidiasis of skin and nail: Secondary | ICD-10-CM | POA: Diagnosis not present

## 2021-12-03 DIAGNOSIS — Z951 Presence of aortocoronary bypass graft: Secondary | ICD-10-CM | POA: Diagnosis not present

## 2021-12-03 DIAGNOSIS — E1169 Type 2 diabetes mellitus with other specified complication: Secondary | ICD-10-CM | POA: Diagnosis not present

## 2021-12-03 DIAGNOSIS — K219 Gastro-esophageal reflux disease without esophagitis: Secondary | ICD-10-CM | POA: Diagnosis not present

## 2021-12-03 DIAGNOSIS — Z794 Long term (current) use of insulin: Secondary | ICD-10-CM | POA: Diagnosis not present

## 2021-12-03 DIAGNOSIS — I252 Old myocardial infarction: Secondary | ICD-10-CM | POA: Diagnosis not present

## 2021-12-03 DIAGNOSIS — E785 Hyperlipidemia, unspecified: Secondary | ICD-10-CM | POA: Diagnosis not present

## 2021-12-03 DIAGNOSIS — Z9181 History of falling: Secondary | ICD-10-CM | POA: Diagnosis not present

## 2021-12-03 DIAGNOSIS — L97322 Non-pressure chronic ulcer of left ankle with fat layer exposed: Secondary | ICD-10-CM | POA: Diagnosis not present

## 2021-12-03 DIAGNOSIS — M1990 Unspecified osteoarthritis, unspecified site: Secondary | ICD-10-CM | POA: Diagnosis not present

## 2021-12-03 DIAGNOSIS — I10 Essential (primary) hypertension: Secondary | ICD-10-CM | POA: Diagnosis not present

## 2021-12-03 DIAGNOSIS — E11622 Type 2 diabetes mellitus with other skin ulcer: Secondary | ICD-10-CM | POA: Diagnosis not present

## 2021-12-03 DIAGNOSIS — D649 Anemia, unspecified: Secondary | ICD-10-CM | POA: Diagnosis not present

## 2021-12-03 DIAGNOSIS — E11621 Type 2 diabetes mellitus with foot ulcer: Secondary | ICD-10-CM | POA: Diagnosis not present

## 2021-12-03 DIAGNOSIS — E11610 Type 2 diabetes mellitus with diabetic neuropathic arthropathy: Secondary | ICD-10-CM | POA: Diagnosis not present

## 2021-12-03 DIAGNOSIS — E1140 Type 2 diabetes mellitus with diabetic neuropathy, unspecified: Secondary | ICD-10-CM | POA: Diagnosis not present

## 2021-12-03 DIAGNOSIS — Z7984 Long term (current) use of oral hypoglycemic drugs: Secondary | ICD-10-CM | POA: Diagnosis not present

## 2021-12-03 DIAGNOSIS — Z7982 Long term (current) use of aspirin: Secondary | ICD-10-CM | POA: Diagnosis not present

## 2021-12-04 DIAGNOSIS — T8131XA Disruption of external operation (surgical) wound, not elsewhere classified, initial encounter: Secondary | ICD-10-CM | POA: Diagnosis not present

## 2021-12-05 DIAGNOSIS — T8131XA Disruption of external operation (surgical) wound, not elsewhere classified, initial encounter: Secondary | ICD-10-CM | POA: Diagnosis not present

## 2021-12-06 ENCOUNTER — Encounter (HOSPITAL_BASED_OUTPATIENT_CLINIC_OR_DEPARTMENT_OTHER): Payer: BC Managed Care – PPO | Admitting: General Surgery

## 2021-12-06 DIAGNOSIS — M86672 Other chronic osteomyelitis, left ankle and foot: Secondary | ICD-10-CM | POA: Diagnosis not present

## 2021-12-06 DIAGNOSIS — Z7982 Long term (current) use of aspirin: Secondary | ICD-10-CM | POA: Diagnosis not present

## 2021-12-06 DIAGNOSIS — L97324 Non-pressure chronic ulcer of left ankle with necrosis of bone: Secondary | ICD-10-CM | POA: Diagnosis not present

## 2021-12-06 DIAGNOSIS — Z9181 History of falling: Secondary | ICD-10-CM | POA: Diagnosis not present

## 2021-12-06 DIAGNOSIS — E785 Hyperlipidemia, unspecified: Secondary | ICD-10-CM | POA: Diagnosis not present

## 2021-12-06 DIAGNOSIS — T8131XA Disruption of external operation (surgical) wound, not elsewhere classified, initial encounter: Secondary | ICD-10-CM | POA: Diagnosis not present

## 2021-12-06 DIAGNOSIS — I2510 Atherosclerotic heart disease of native coronary artery without angina pectoris: Secondary | ICD-10-CM | POA: Diagnosis not present

## 2021-12-06 DIAGNOSIS — K219 Gastro-esophageal reflux disease without esophagitis: Secondary | ICD-10-CM | POA: Diagnosis not present

## 2021-12-06 DIAGNOSIS — Z794 Long term (current) use of insulin: Secondary | ICD-10-CM | POA: Diagnosis not present

## 2021-12-06 DIAGNOSIS — B372 Candidiasis of skin and nail: Secondary | ICD-10-CM | POA: Diagnosis not present

## 2021-12-06 DIAGNOSIS — M1990 Unspecified osteoarthritis, unspecified site: Secondary | ICD-10-CM | POA: Diagnosis not present

## 2021-12-06 DIAGNOSIS — E1169 Type 2 diabetes mellitus with other specified complication: Secondary | ICD-10-CM | POA: Diagnosis not present

## 2021-12-06 DIAGNOSIS — E1140 Type 2 diabetes mellitus with diabetic neuropathy, unspecified: Secondary | ICD-10-CM | POA: Diagnosis not present

## 2021-12-06 DIAGNOSIS — Z951 Presence of aortocoronary bypass graft: Secondary | ICD-10-CM | POA: Diagnosis not present

## 2021-12-06 DIAGNOSIS — D649 Anemia, unspecified: Secondary | ICD-10-CM | POA: Diagnosis not present

## 2021-12-06 DIAGNOSIS — L97322 Non-pressure chronic ulcer of left ankle with fat layer exposed: Secondary | ICD-10-CM | POA: Diagnosis not present

## 2021-12-06 DIAGNOSIS — E11610 Type 2 diabetes mellitus with diabetic neuropathic arthropathy: Secondary | ICD-10-CM | POA: Diagnosis not present

## 2021-12-06 DIAGNOSIS — E11622 Type 2 diabetes mellitus with other skin ulcer: Secondary | ICD-10-CM | POA: Diagnosis not present

## 2021-12-06 DIAGNOSIS — Z7984 Long term (current) use of oral hypoglycemic drugs: Secondary | ICD-10-CM | POA: Diagnosis not present

## 2021-12-06 DIAGNOSIS — I252 Old myocardial infarction: Secondary | ICD-10-CM | POA: Diagnosis not present

## 2021-12-06 DIAGNOSIS — I10 Essential (primary) hypertension: Secondary | ICD-10-CM | POA: Diagnosis not present

## 2021-12-06 LAB — GLUCOSE, CAPILLARY
Glucose-Capillary: 209 mg/dL — ABNORMAL HIGH (ref 70–99)
Glucose-Capillary: 140 mg/dL — ABNORMAL HIGH (ref 70–99)
Glucose-Capillary: 120 mg/dL — ABNORMAL HIGH (ref 70–99)

## 2021-12-07 ENCOUNTER — Encounter (HOSPITAL_BASED_OUTPATIENT_CLINIC_OR_DEPARTMENT_OTHER): Payer: BC Managed Care – PPO | Admitting: General Surgery

## 2021-12-07 DIAGNOSIS — E11610 Type 2 diabetes mellitus with diabetic neuropathic arthropathy: Secondary | ICD-10-CM | POA: Diagnosis not present

## 2021-12-07 DIAGNOSIS — M86672 Other chronic osteomyelitis, left ankle and foot: Secondary | ICD-10-CM | POA: Diagnosis not present

## 2021-12-07 DIAGNOSIS — Z951 Presence of aortocoronary bypass graft: Secondary | ICD-10-CM | POA: Diagnosis not present

## 2021-12-07 DIAGNOSIS — I2510 Atherosclerotic heart disease of native coronary artery without angina pectoris: Secondary | ICD-10-CM | POA: Diagnosis not present

## 2021-12-07 DIAGNOSIS — L97324 Non-pressure chronic ulcer of left ankle with necrosis of bone: Secondary | ICD-10-CM | POA: Diagnosis not present

## 2021-12-07 DIAGNOSIS — B372 Candidiasis of skin and nail: Secondary | ICD-10-CM | POA: Diagnosis not present

## 2021-12-07 DIAGNOSIS — E11622 Type 2 diabetes mellitus with other skin ulcer: Secondary | ICD-10-CM | POA: Diagnosis not present

## 2021-12-07 DIAGNOSIS — T8131XA Disruption of external operation (surgical) wound, not elsewhere classified, initial encounter: Secondary | ICD-10-CM | POA: Diagnosis not present

## 2021-12-07 DIAGNOSIS — E1169 Type 2 diabetes mellitus with other specified complication: Secondary | ICD-10-CM | POA: Diagnosis not present

## 2021-12-07 LAB — GLUCOSE, CAPILLARY
Glucose-Capillary: 227 mg/dL — ABNORMAL HIGH (ref 70–99)
Glucose-Capillary: 181 mg/dL — ABNORMAL HIGH (ref 70–99)
Glucose-Capillary: 159 mg/dL — ABNORMAL HIGH (ref 70–99)

## 2021-12-08 ENCOUNTER — Encounter (HOSPITAL_BASED_OUTPATIENT_CLINIC_OR_DEPARTMENT_OTHER): Payer: BC Managed Care – PPO | Admitting: General Surgery

## 2021-12-08 DIAGNOSIS — K219 Gastro-esophageal reflux disease without esophagitis: Secondary | ICD-10-CM | POA: Diagnosis not present

## 2021-12-08 DIAGNOSIS — E11610 Type 2 diabetes mellitus with diabetic neuropathic arthropathy: Secondary | ICD-10-CM | POA: Diagnosis not present

## 2021-12-08 DIAGNOSIS — L97324 Non-pressure chronic ulcer of left ankle with necrosis of bone: Secondary | ICD-10-CM | POA: Diagnosis not present

## 2021-12-08 DIAGNOSIS — I10 Essential (primary) hypertension: Secondary | ICD-10-CM | POA: Diagnosis not present

## 2021-12-08 DIAGNOSIS — M86672 Other chronic osteomyelitis, left ankle and foot: Secondary | ICD-10-CM | POA: Diagnosis not present

## 2021-12-08 DIAGNOSIS — E11622 Type 2 diabetes mellitus with other skin ulcer: Secondary | ICD-10-CM | POA: Diagnosis not present

## 2021-12-08 DIAGNOSIS — Z794 Long term (current) use of insulin: Secondary | ICD-10-CM | POA: Diagnosis not present

## 2021-12-08 DIAGNOSIS — Z951 Presence of aortocoronary bypass graft: Secondary | ICD-10-CM | POA: Diagnosis not present

## 2021-12-08 DIAGNOSIS — E785 Hyperlipidemia, unspecified: Secondary | ICD-10-CM | POA: Diagnosis not present

## 2021-12-08 DIAGNOSIS — T8131XA Disruption of external operation (surgical) wound, not elsewhere classified, initial encounter: Secondary | ICD-10-CM | POA: Diagnosis not present

## 2021-12-08 DIAGNOSIS — Z7982 Long term (current) use of aspirin: Secondary | ICD-10-CM | POA: Diagnosis not present

## 2021-12-08 DIAGNOSIS — D649 Anemia, unspecified: Secondary | ICD-10-CM | POA: Diagnosis not present

## 2021-12-08 DIAGNOSIS — I2510 Atherosclerotic heart disease of native coronary artery without angina pectoris: Secondary | ICD-10-CM | POA: Diagnosis not present

## 2021-12-08 DIAGNOSIS — I252 Old myocardial infarction: Secondary | ICD-10-CM | POA: Diagnosis not present

## 2021-12-08 DIAGNOSIS — Z7984 Long term (current) use of oral hypoglycemic drugs: Secondary | ICD-10-CM | POA: Diagnosis not present

## 2021-12-08 DIAGNOSIS — L97322 Non-pressure chronic ulcer of left ankle with fat layer exposed: Secondary | ICD-10-CM | POA: Diagnosis not present

## 2021-12-08 DIAGNOSIS — E1140 Type 2 diabetes mellitus with diabetic neuropathy, unspecified: Secondary | ICD-10-CM | POA: Diagnosis not present

## 2021-12-08 DIAGNOSIS — E1169 Type 2 diabetes mellitus with other specified complication: Secondary | ICD-10-CM | POA: Diagnosis not present

## 2021-12-08 DIAGNOSIS — M1990 Unspecified osteoarthritis, unspecified site: Secondary | ICD-10-CM | POA: Diagnosis not present

## 2021-12-08 DIAGNOSIS — B372 Candidiasis of skin and nail: Secondary | ICD-10-CM | POA: Diagnosis not present

## 2021-12-08 DIAGNOSIS — Z9181 History of falling: Secondary | ICD-10-CM | POA: Diagnosis not present

## 2021-12-08 LAB — GLUCOSE, CAPILLARY
Glucose-Capillary: 202 mg/dL — ABNORMAL HIGH (ref 70–99)
Glucose-Capillary: 167 mg/dL — ABNORMAL HIGH (ref 70–99)

## 2021-12-09 ENCOUNTER — Encounter (HOSPITAL_BASED_OUTPATIENT_CLINIC_OR_DEPARTMENT_OTHER): Payer: BC Managed Care – PPO | Admitting: General Surgery

## 2021-12-09 DIAGNOSIS — L97324 Non-pressure chronic ulcer of left ankle with necrosis of bone: Secondary | ICD-10-CM | POA: Diagnosis not present

## 2021-12-09 DIAGNOSIS — E11622 Type 2 diabetes mellitus with other skin ulcer: Secondary | ICD-10-CM | POA: Diagnosis not present

## 2021-12-09 DIAGNOSIS — Z951 Presence of aortocoronary bypass graft: Secondary | ICD-10-CM | POA: Diagnosis not present

## 2021-12-09 DIAGNOSIS — E11610 Type 2 diabetes mellitus with diabetic neuropathic arthropathy: Secondary | ICD-10-CM | POA: Diagnosis not present

## 2021-12-09 DIAGNOSIS — T8131XA Disruption of external operation (surgical) wound, not elsewhere classified, initial encounter: Secondary | ICD-10-CM | POA: Diagnosis not present

## 2021-12-09 DIAGNOSIS — M86672 Other chronic osteomyelitis, left ankle and foot: Secondary | ICD-10-CM | POA: Diagnosis not present

## 2021-12-09 DIAGNOSIS — E1169 Type 2 diabetes mellitus with other specified complication: Secondary | ICD-10-CM | POA: Diagnosis not present

## 2021-12-09 DIAGNOSIS — B372 Candidiasis of skin and nail: Secondary | ICD-10-CM | POA: Diagnosis not present

## 2021-12-09 DIAGNOSIS — I2510 Atherosclerotic heart disease of native coronary artery without angina pectoris: Secondary | ICD-10-CM | POA: Diagnosis not present

## 2021-12-09 LAB — GLUCOSE, CAPILLARY
Glucose-Capillary: 284 mg/dL — ABNORMAL HIGH (ref 70–99)
Glucose-Capillary: 235 mg/dL — ABNORMAL HIGH (ref 70–99)

## 2021-12-09 NOTE — Progress Notes (Addendum)
ARAGON, SCARANTINO ([id number]) Visit Report for 12/09/2021 Arrival Information Details Patient Name: Date of Service: NA [name] 12/09/2021 10:00 A M Medical Record Number: [id number] Patient Account Number: [id number] Date of Birth/Sex: Treating RN: [birth year] (66 y.o. [name], [name] Primary Care Eisley Barber: [name] Other Clinician: [name] Referring Reshad Saab: Treating Orlando Devereux/Extender: [name] in Treatment: 15 Visit Information History Since Last Visit All ordered tests and consults were completed: Yes Patient Arrived: Knee Scooter Added or deleted any medications: No Arrival Time: 09:16 Any new allergies or adverse reactions: No Accompanied By: Wife Had a fall or experienced change in No Transfer Assistance: None activities of daily living that may affect Patient Identification Verified: Yes risk of falls: Secondary Verification Process Completed: Yes Signs or symptoms of abuse/neglect since last visito No Patient Requires Transmission-Based Precautions: No Hospitalized since last visit: No Patient Has Alerts: Yes Implantable device outside of the clinic excluding No Patient Alerts: Patient on Blood Thinner cellular tissue based products placed in the center since last visit: Pain Present Now: No Electronic Signature(s) Signed: 12/09/2021 11:01:45 AM By: [name] EMT Entered By: [name] on 12/09/2021 11:01:45 -------------------------------------------------------------------------------- Encounter Discharge Information Details Patient Name: Date of Service: NA [name] 12/09/2021 10:00 A M Medical Record Number: [id number] Patient Account Number: [id number] Date of Birth/Sex: Treating RN: 09-Nov-1955 (66 y.o. [name] Primary Care Tyron Manetta: [name] Other Clinician: [name] Referring Anwita Mencer: Treating Rumeal Cullipher/Extender: [name] in Treatment: 15 Encounter  Discharge Information Items Discharge Condition: Stable Ambulatory Status: Knee Scooter Discharge Destination: Home Transportation: Private Auto Accompanied By: Wife Schedule Follow-up Appointment: Yes Clinical Summary of Care: Electronic Signature(s) Signed: 12/09/2021 1:04:14 PM By: [name] EMT Entered By: [name] on 12/09/2021 13:04:14 -------------------------------------------------------------------------------- Vitals Details Patient Name: Date of Service: NA [name] 12/09/2021 10:00 A M Medical Record Number: [id number] Patient Account Number: [id number] Date of Birth/Sex: Treating RN: [birth year] (66 y.o. [name], [name] Primary Care Holden Draughon: [name] Other Clinician: [name] Referring Geremy Rister: Treating Asim Gersten/Extender: [name]: 15 Vital Signs Time Taken: 09:31 Temperature (F): 97.7 Height (in): 74 Pulse (bpm): 72 Weight (lbs): 186 Respiratory Rate (breaths/min): 16 Body Mass Index (BMI): 23.9 Blood Pressure (mmHg): 123/95 Reference Range: 80 - 120 mg / dl Electronic Signature(s) Signed: 12/09/2021 11:02:26 AM By: [name] EMT Entered By: [name] on 12/09/2021 11:02:26

## 2021-12-09 NOTE — Progress Notes (Signed)
STEVON, GOUGH ([id number]) Visit Report for 12/09/2021 Problem List Details Patient Name: Date of Service: NA [name] 12/09/2021 10:00 A M Medical Record Number: [id number] Patient Account Number: [id number] Date of Birth/Sex: Treating RN: 1955/12/29 (66 y.o. 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 12/09/2021 Medical Record Number: [id number] Patient Account Number: [id number] Date of Birth/Sex: Treating RN: July 31, 1955 (66 y.o. 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 M86.672 Other chronic osteomyelitis, left ankle and foot E11.610 Type 2 diabetes mellitus with diabetic  neuropathic arthropathy I25.10 Atherosclerotic heart disease of native coronary artery without angina pectoris Facility Procedures CPT4 Code: 54982641 Description: G0277-(Facility Use Only) HBOT full body chamber, , ICD-10 Diagnosis Description E11.621 Type 2 diabetes mellitus with foot ulcer 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 M86.672 Other chronic  osteomyelitis, left ankle and foot 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

## 2021-12-09 NOTE — Progress Notes (Addendum)
RAEGAN, SIPP ([id number]) Visit Report for 12/09/2021 HBO Details Patient Name: Date of Service: NA [name] 12/09/2021 10:00 A M Medical Record Number: [id number] Patient Account Number: [id number] Date of Birth/Sex: Treating RN: 04-06-1956 (66 y.o. [name], [name] Primary Care Gorje Iyer: [name] Other Clinician: [name] Referring Leigh Blas: Treating Lilya Smitherman/Extender: [name]: 15 HBO Treatment Course Details Treatment Course Number: 1 Ordering Shakyla Nolley: [name] T Treatments Ordered: otal 80 HBO Treatment Start Date: 09/20/2021 HBO Indication: Diabetic Ulcer(s) of the Lower Extremity Standard/Conservative Wound Care tried and failed greater than or equal to 30 days HBO Treatment Details Treatment Number: 57 Patient Type: Outpatient Chamber Type: Monoplace Chamber Serial #: [id number] Treatment Protocol: 2.5 ATA with 90 minutes oxygen, with two 5 minute air breaks Treatment Details Compression Rate Down: 2.0 psi / minute De-Compression Rate Up: A breaks and breathing ir Compress Tx Pressure periods Decompress Decompress Begins Reached (leave unused spaces Begins Ends blank) Chamber Pressure (ATA 1 2.5 2.5 2.5 2.5 2.5 - - 2.5 1 ) Clock Time (24 hr) 10:06 10:20 10:50 10:55 11:25 11:30 - - 12:00 12:13 Treatment Length: 127 (minutes) Treatment Segments: 4 Vital Signs Capillary Blood Glucose Reference Range: 80 - 120 mg / dl HBO Diabetic Blood Glucose Intervention Range: <131 mg/dl or >086 mg/dl Time Vitals Blood Respiratory Capillary Blood Glucose Pulse Action Type: Pulse: Temperature: Taken: Pressure: Rate: Glucose (mg/dl): Meter #: Oximetry (%) Taken: Pre 09:31 123/95 72 16 97.7 Pre 09:17 284 Post 12:17 155/95 69 16 97.9 235 Treatment Response Treatment Toleration: Well Treatment Completion Status: Treatment Completed without Adverse Event Additional Procedure Documentation Tissue Sevierity: Necrosis  of muscle Electronic Signature(s) Signed: 12/09/2021 1:01:59 PM By: [name] EMT Signed: 12/09/2021 2:00:21 PM By: [name] MD FACS Previous Signature: 12/09/2021 11:44:59 AM Version By: [name] MD FACS Previous Signature: 12/09/2021 11:05:51 AM Version By: [name] EMT Entered By: [name] on 12/09/2021 13:01:59 -------------------------------------------------------------------------------- HBO Safety Checklist Details Patient Name: Date of Service: NA Colin Nephew A. 12/09/2021 10:00 A M Medical Record Number: [id number] Patient Account Number: [id number] Date of Birth/Sex: Treating RN: July 12, 1955 (66 y.o. [name], [name] Primary Care Joal Eakle: [name] Other Clinician: [name] Referring Jenet Durio: Treating Isabella Roemmich/Extender: [name]: 15 HBO Safety Checklist Items Safety Checklist Consent Form Signed Patient voided / foley secured and emptied When did you last eato 0700 Last dose of injectable or oral agent 0700 Ostomy pouch emptied and vented if applicable NA All implantable devices assessed, documented and approved NA Intravenous access site secured and place NA Valuables secured Linens and cotton and cotton/polyester blend (less than 51% polyester) Personal oil-based products / skin lotions / body lotions removed Wigs or hairpieces removed NA Smoking or tobacco materials removed Books / newspapers / magazines / loose paper removed Cologne, aftershave, perfume and deodorant removed Jewelry removed (may wrap wedding band) NA Make-up removed NA Hair care products removed Battery operated devices (external) removed Heating patches and chemical warmers removed Titanium eyewear removed NA Nail polish cured greater than 10 hours NA Casting material cured greater than 10 hours NA Hearing aids removed NA Loose dentures or partials removed NA Prosthetics have been removed NA Patient  demonstrates correct use of air break device (if applicable) Patient concerns have been addressed Patient grounding bracelet on and cord attached to chamber Specifics for Inpatients (complete in addition to above) Medication sheet sent with patient NA Intravenous medications needed or due during  therapy sent with patient NA Drainage tubes (e.g. nasogastric tube or chest tube secured and vented) NA Endotracheal or Tracheotomy tube secured NA Cuff deflated of air and inflated with saline NA Airway suctioned NA Notes The safety checklist was done before treatment started Electronic Signature(s) Signed: 12/09/2021 11:03:51 AM By: [name] EMT Entered By: [name] on 12/09/2021 11:03:51

## 2021-12-10 ENCOUNTER — Encounter (HOSPITAL_BASED_OUTPATIENT_CLINIC_OR_DEPARTMENT_OTHER): Payer: BC Managed Care – PPO | Admitting: General Surgery

## 2021-12-10 DIAGNOSIS — T8131XA Disruption of external operation (surgical) wound, not elsewhere classified, initial encounter: Secondary | ICD-10-CM | POA: Diagnosis not present

## 2021-12-10 DIAGNOSIS — L97324 Non-pressure chronic ulcer of left ankle with necrosis of bone: Secondary | ICD-10-CM | POA: Diagnosis not present

## 2021-12-10 DIAGNOSIS — I10 Essential (primary) hypertension: Secondary | ICD-10-CM | POA: Diagnosis not present

## 2021-12-10 DIAGNOSIS — E11610 Type 2 diabetes mellitus with diabetic neuropathic arthropathy: Secondary | ICD-10-CM | POA: Diagnosis not present

## 2021-12-10 DIAGNOSIS — I252 Old myocardial infarction: Secondary | ICD-10-CM | POA: Diagnosis not present

## 2021-12-10 DIAGNOSIS — Z9181 History of falling: Secondary | ICD-10-CM | POA: Diagnosis not present

## 2021-12-10 DIAGNOSIS — E1140 Type 2 diabetes mellitus with diabetic neuropathy, unspecified: Secondary | ICD-10-CM | POA: Diagnosis not present

## 2021-12-10 DIAGNOSIS — L97322 Non-pressure chronic ulcer of left ankle with fat layer exposed: Secondary | ICD-10-CM | POA: Diagnosis not present

## 2021-12-10 DIAGNOSIS — E1169 Type 2 diabetes mellitus with other specified complication: Secondary | ICD-10-CM | POA: Diagnosis not present

## 2021-12-10 DIAGNOSIS — K219 Gastro-esophageal reflux disease without esophagitis: Secondary | ICD-10-CM | POA: Diagnosis not present

## 2021-12-10 DIAGNOSIS — E785 Hyperlipidemia, unspecified: Secondary | ICD-10-CM | POA: Diagnosis not present

## 2021-12-10 DIAGNOSIS — Z7982 Long term (current) use of aspirin: Secondary | ICD-10-CM | POA: Diagnosis not present

## 2021-12-10 DIAGNOSIS — D649 Anemia, unspecified: Secondary | ICD-10-CM | POA: Diagnosis not present

## 2021-12-10 DIAGNOSIS — B372 Candidiasis of skin and nail: Secondary | ICD-10-CM | POA: Diagnosis not present

## 2021-12-10 DIAGNOSIS — Z7984 Long term (current) use of oral hypoglycemic drugs: Secondary | ICD-10-CM | POA: Diagnosis not present

## 2021-12-10 DIAGNOSIS — M86672 Other chronic osteomyelitis, left ankle and foot: Secondary | ICD-10-CM | POA: Diagnosis not present

## 2021-12-10 DIAGNOSIS — Z794 Long term (current) use of insulin: Secondary | ICD-10-CM | POA: Diagnosis not present

## 2021-12-10 DIAGNOSIS — I2510 Atherosclerotic heart disease of native coronary artery without angina pectoris: Secondary | ICD-10-CM | POA: Diagnosis not present

## 2021-12-10 DIAGNOSIS — Z951 Presence of aortocoronary bypass graft: Secondary | ICD-10-CM | POA: Diagnosis not present

## 2021-12-10 DIAGNOSIS — E11622 Type 2 diabetes mellitus with other skin ulcer: Secondary | ICD-10-CM | POA: Diagnosis not present

## 2021-12-10 DIAGNOSIS — M1990 Unspecified osteoarthritis, unspecified site: Secondary | ICD-10-CM | POA: Diagnosis not present

## 2021-12-10 LAB — GLUCOSE, CAPILLARY
Glucose-Capillary: 294 mg/dL — ABNORMAL HIGH (ref 70–99)
Glucose-Capillary: 280 mg/dL — ABNORMAL HIGH (ref 70–99)

## 2021-12-10 NOTE — Progress Notes (Addendum)
ROCHESTER, SERPE ([id number]) Visit Report for 12/10/2021 HBO Details Patient Name: Date of Service: NA [name] 12/10/2021 10:00 A M Medical Record Number: [id number] Patient Account Number: [id number] Date of Birth/Sex: Treating RN: [birth year] (66 y.o. [name] Primary Care Fitzhugh Vizcarrondo: [name] Other Clinician: [name] Referring Margarite Vessel: Treating Ivonna Kinnick/Extender: [name]: 15 HBO Treatment Course Details Treatment Course Number: 1 Ordering Lamaria Hildebrandt: [name] T Treatments Ordered: otal 80 HBO Treatment Start Date: 09/20/2021 HBO Indication: Diabetic Ulcer(s) of the Lower Extremity Standard/Conservative Wound Care tried and failed greater than or equal to 30 days HBO Treatment Details Treatment Number: 58 Patient Type: Outpatient Chamber Type: Monoplace Chamber Serial #: [id number] Treatment Protocol: 2.0 ATA with 90 minutes oxygen, and no air breaks Treatment Details Compression Rate Down: 2.0 psi / minute De-Compression Rate Up: 2.0 psi / minute Air breaks and breathing Decompress Decompress Compress Tx Pressure Begins Reached periods Begins Ends (leave unused spaces blank) Chamber Pressure (ATA 1 2 ------2 1 ) Clock Time (24 hr) 09:41 09:54 - - - - - - 11:14 11:24 Treatment Length: 103 (minutes) Treatment Segments: 3 Vital Signs Capillary Blood Glucose Reference Range: 80 - 120 mg / dl HBO Diabetic Blood Glucose Intervention Range: <131 mg/dl or >262 mg/dl Time Vitals Blood Respiratory Capillary Blood Glucose Pulse Action Type: Pulse: Temperature: Taken: Pressure: Rate: Glucose (mg/dl): Meter #: Oximetry (%) Taken: Pre 09:36 126/81 89 16 98.1 294 Post 11:25 158/95 76 16 97.9 260 Treatment Response Treatment Toleration: Well Treatment Completion Status: Treatment Completed without Adverse Event Treatment Notes The patient was found to have been placed in chamber with his air break mask.  Dr. Lady Gary was informed, Dr. Lady Gary changed to order to 2.0 ATA without air brake for today only. Additional Procedure Documentation Tissue Sevierity: Necrosis of bone Physician HBO Attestation: I certify that I supervised this HBO treatment in accordance with Medicare guidelines. A trained emergency response team is readily available per Yes hospital policies and procedures. Continue HBOT as ordered. Yes Electronic Signature(s) Signed: 12/10/2021 4:39:02 PM By: [name] MD FACS Previous Signature: 12/10/2021 3:28:14 PM Version By: [name] EMT Previous Signature: 12/10/2021 3:23:39 PM Version By: [name] EMT Previous Signature: 12/10/2021 3:19:46 PM Version By: [name] EMT Entered By: [name] on 12/10/2021 16:39:02 -------------------------------------------------------------------------------- HBO Safety Checklist Details Patient Name: Date of Service: NA Colin Nephew A. 12/10/2021 10:00 A M Medical Record Number: [id number] Patient Account Number: [id number] Date of Birth/Sex: Treating RN: 1955-07-09 (66 y.o. [name] Primary Care Anahi Belmar: [name] Other Clinician: [name] Referring Nathanyel Defenbaugh: Treating Raechal Raben/Extender: [name]: 15 HBO Safety Checklist Items Safety Checklist Consent Form Signed Patient voided / foley secured and emptied When did you last eato 0645 Last dose of injectable or oral agent 0645 Ostomy pouch emptied and vented if applicable NA All implantable devices assessed, documented and approved NA Intravenous access site secured and place NA Valuables secured Linens and cotton and cotton/polyester blend (less than 51% polyester) Personal oil-based products / skin lotions / body lotions removed Wigs or hairpieces removed NA Smoking or tobacco materials removed Books / newspapers / magazines / loose paper removed Cologne, aftershave, perfume and deodorant  removed Jewelry removed (may wrap wedding band) NA Make-up removed NA Hair care products removed Battery operated devices (external) removed Heating patches and chemical warmers removed Titanium eyewear removed NA Nail polish cured greater than 10 hours NA Casting material cured  greater than 10 hours NA Hearing aids removed NA Loose dentures or partials removed NA Prosthetics have been removed NA Patient demonstrates correct use of air break device (if applicable) Patient concerns have been addressed Patient grounding bracelet on and cord attached to chamber Specifics for Inpatients (complete in addition to above) Medication sheet sent with patient NA Intravenous medications needed or due during therapy sent with patient NA Drainage tubes (e.g. nasogastric tube or chest tube secured and vented) NA Endotracheal or Tracheotomy tube secured NA Cuff deflated of air and inflated with saline NA Airway suctioned NA Notes The safety checklist was done before treatment started Electronic Signature(s) Signed: 12/10/2021 3:13:57 PM By: [name] EMT Entered By: [name] on 12/10/2021 15:13:57

## 2021-12-10 NOTE — Progress Notes (Addendum)
RYDER, MAN ([id number]) Visit Report for 12/10/2021 Arrival Information Details Patient Name: Date of Service: NA [name] 12/10/2021 10:00 A M Medical Record Number: [id number] Patient Account Number: [id number] Date of Birth/Sex: Treating RN: 25-Oct-1955 (66 y.o. [name] Primary Care Kristan Votta: [name] Other Clinician: [name] Referring Eleonora Peeler: Treating Maresa Morash/Extender: [name] in Treatment: 15 Visit Information History Since Last Visit All ordered tests and consults were completed: Yes Patient Arrived: Knee Scooter Added or deleted any medications: No Arrival Time: 09:19 Any new allergies or adverse reactions: No Accompanied By: Wife Had a fall or experienced change in No Transfer Assistance: None activities of daily living that may affect Patient Identification Verified: Yes risk of falls: Secondary Verification Process Completed: Yes Signs or symptoms of abuse/neglect since last visito No Patient Requires Transmission-Based Precautions: No Hospitalized since last visit: No Patient Has Alerts: Yes Implantable device outside of the clinic excluding No Patient Alerts: Patient on Blood Thinner cellular tissue based products placed in the center since last visit: Pain Present Now: No Electronic Signature(s) Signed: 12/10/2021 11:46:58 AM By: [name] EMT Entered By: [name] on 12/10/2021 11:46:57 -------------------------------------------------------------------------------- Encounter Discharge Information Details Patient Name: Date of Service: NA Colin Nephew A. 12/10/2021 10:00 A M Medical Record Number: [id number] Patient Account Number: [id number] Date of Birth/Sex: Treating RN: Aug 01, 1955 (66 y.o. [name] Primary Care Danyeal Akens: [name] Other Clinician: [name] Referring Colm Lyford: Treating Deryn Massengale/Extender: [name] in Treatment: 15 Encounter  Discharge Information Items Discharge Condition: Stable Ambulatory Status: Ambulatory Discharge Destination: Home Transportation: Private Auto Accompanied By: None Schedule Follow-up Appointment: Yes Clinical Summary of Care: Electronic Signature(s) Signed: 12/10/2021 3:31:27 PM By: [name] EMT Entered By: [name] on 12/10/2021 15:31:27 -------------------------------------------------------------------------------- Vitals Details Patient Name: Date of Service: [name] 12/10/2021 10:00 A M Medical Record Number: [id number] Patient Account Number: [id number] Date of Birth/Sex: Treating RN: February 22, 1956 (66 y.o. [name] Primary Care Analeia Ismael: [name] Other Clinician: [name] Referring Malon Siddall: Treating Livier Hendel/Extender: [name]: 15 Vital Signs Time Taken: 09:36 Temperature (F): 98.1 Height (in): 74 Pulse (bpm): 89 Weight (lbs): 186 Respiratory Rate (breaths/min): 16 Body Mass Index (BMI): 23.9 Blood Pressure (mmHg): 126/81 Capillary Blood Glucose (mg/dl): 211 Reference Range: 80 - 120 mg / dl Electronic Signature(s) Signed: 12/10/2021 3:16:22 PM By: [name] EMT Entered By: [name] on 12/10/2021 15:16:21

## 2021-12-10 NOTE — Progress Notes (Signed)
TRESHAWN, ALLEN ([id number]) Visit Report for 12/10/2021 Chief Complaint Document Details Patient Name: Date of Service: Jerelyn Scott 12/10/2021 12:45 PM Medical Record Number: [id number] Patient Account Number: [id number] Date of Birth/Sex: Treating RN: 06/06/1956 (66 y.o. [name] Primary Care Provider: [name] Other Clinician: Referring Provider: Treating Provider/Extender: [name] in Treatment: 15 Information Obtained from: Patient Chief Complaint Patients presents for treatment of an open diabetic ulcer with exposed bone and osteomyelitis Electronic Signature(s) Signed: 12/10/2021 12:09:57 PM By: [name] MD FACS Entered By: [name] on 12/10/2021 12:09:56 -------------------------------------------------------------------------------- Debridement Details Patient Name: Date of Service: NA [name] 12/10/2021 12:45 PM Medical Record Number: [id number] Patient Account Number: [id number] Date of Birth/Sex: Treating RN: 02-09-1956 (66 y.o. [name] Primary Care Provider: [name] Other Clinician: Referring Provider: Treating Provider/Extender: [name]: 15 Debridement Performed for Assessment: Wound #1 Left,Medial Foot Performed By: Physician [name], MD Debridement Type: Debridement Severity of Tissue Pre Debridement: Fat layer exposed Level of Consciousness (Pre-procedure): Awake and Alert Pre-procedure Verification/Time Out Yes - 11:54 Taken: Start Time: 11:54 Pain Control: Lidocaine 4% T opical Solution T Area Debrided (L x W): otal 6 (cm) x 2.7 (cm) = 16.2 (cm) Tissue and other material debrided: Non-Viable, Callus, Slough, Skin: Epidermis, Slough Level: Skin/Epidermis Debridement Description: Selective/Open Wound Instrument: Curette Bleeding: Minimum Hemostasis Achieved: Pressure Procedural Pain: 0 Post Procedural Pain: 0 Response  to Treatment: Procedure was tolerated well Level of Consciousness (Post- Awake and Alert procedure): Post Debridement Measurements of Total Wound Length: (cm) 6 Width: (cm) 2.7 Depth: (cm) 0.4 Volume: (cm) 5.089 Character of Wound/Ulcer Post Debridement: Improved Severity of Tissue Post Debridement: Fat layer exposed Post Procedure Diagnosis Same as Pre-procedure Electronic Signature(s) Signed: 12/10/2021 12:54:45 PM By: [name] MD FACS Signed: 12/10/2021 5:03:19 PM By: Samuella Bruin Previous Signature: 12/10/2021 12:20:34 PM Version By: [name] MD FACS Entered By: Samuella Bruin on 12/10/2021 12:51:28 -------------------------------------------------------------------------------- HPI Details Patient Name: Date of Service: NA [name] 12/10/2021 12:45 PM Medical Record Number: [id number] Patient Account Number: [id number] Date of Birth/Sex: Treating RN: 1956-04-02 (66 y.o. [name] Primary Care Provider: [name] Other Clinician: Referring Provider: Treating Provider/Extender: [name]: 15 History of Present Illness HPI Description: ADMISSION 08/25/2021 This is a 66 year old man who initially presented to his primary care provider in September 2022 with pain in his left foot. He was sent for an x-ray and while the x-ray was being performed, the tech pointed out a wound on his foot that the patient was not aware existed. He does have type 2 diabetes with significant neuropathy. His diabetes is suboptimally controlled with his most recent A1c being 8.5. He also has a history of coronary artery disease status post three- vessel CABG. he was initially seen by orthopedics, but they referred him to [facility]. He has undergone at least 7 operations/debridements and several applications of skin substitute under the care of podiatry. He has been in a wound VAC for much of this time. His  most recent procedure was July 28, 2021. A portion of the talus was biopsied and was found to be consistent with osteomyelitis. Culture also returned positive for corynebacterium. He was seen on August 16, 2021 by infectious disease. A PICC line has been placed and he will be receiving a 6-week course of IV daptomycin and cefepime. In October 2022, he  underwent lower extremity vascular studies. Results are copied here: Right: Resting right ankle-brachial index is within normal range. No evidence of significant right lower extremity arterial disease. The right toe-brachial index is abnormal. Left: Resting left ankle-brachial index indicates mild left lower extremity arterial disease. The left toe-brachial index is abnormal. He has not been seen by vascular surgery despite these findings. He presented to clinic today in a cam boot and is using a knee scooter to offload. Wound VAC was in place. Once this was removed, a large ulcer was identified on the left midfoot/ankle. Bone is frankly exposed. There is no malodorous or purulent drainage. There is some granulation tissue over the central portion of the exposed bone. There is a tunnel that extends posteriorly for roughly 10 cm. It has been discussed with him by multiple providers that he is at very high risk of losing his lower leg because of this wound. He is extremely eager to avoid this outcome and is here today to review his options as well as receive ongoing wound care. 09/03/2021: Here for reevaluation of his wound. There does not appear to have been any substantial improvement overall since our last visit. He has been in a wound VAC with white foam overlying the exposed bone. We are working on getting him approved for hyperbaric oxygen therapy. 09/10/2021: We are in the process of getting him cleared to begin hyperbaric oxygen therapy. He still needs to obtain a chest x-ray. Although the wound measurements are roughly the same, I think the  overall appearance of the wound is better. The exposed bone has a bit more granulation tissue covering it. He has not received a vascular surgery appointment to reevaluate his flow to the wound. 09/17/2021: He has been approved for hyperbaric oxygen therapy and completed his chest x-ray, which I reviewed and it appears normal. The tunnels at the 12 and 10:00 positions are smaller. There is more granulation tissue covering the exposed bone and the undermining has decreased. He still has not received a vascular surgery appointment. 09/24/2021: He initiated hyperbaric oxygen therapy this week and is tolerating it well. He has an appointment with vascular surgery coming up on May 16. The granulation tissue is covering more of the exposed bone and both tunnels are a bit smaller. 10/01/2021: He continues to tolerate hyperbaric oxygen therapy. He saw infectious disease and they are planning to pull his PICC line. He has been initiated on oral antibiotics (doxycycline and Augmentin). The wound looks about the same but the tunnels are a little bit smaller. The skin seems to be contracting somewhat around the exposed bone. 10/08/2021: The wound is still about the same size, but the tunnels continue to come in and the skin is contracting around the exposed bone. He continues to have some accumulation of necrotic material in the inferoposterior aspect of the wound as well as accumulation at the 12:00 tunnel area. 10/15/2021: The wound is smaller today. The tunnels continue to come in. There is less necrotic tissue present. He does have some periwound maceration. 10/22/2021: The wound is about the same size. There is a little bit less undermining at the distal portion. The exposed bone is dark and I am not sure if this is staining from silver nitrate or his VAC sponge or if it represents necrosis. The tunnels are shallower but he does have some serous drainage coming from the 10:00 tunnel. He continues to tolerate  hyperbaric oxygen therapy well. 10/29/2021: The undermining continues to improve. The tunnels are  about the same. He has good granulation tissue overlying the majority of the exposed bone. It does appear that perhaps the tubing from his wound VAC has been eroding the skin at the 12 clock position. He continues to accumulate senescent epithelium around the borders of the wound. 11/05/2021: The undermining is almost completely resolved. The tunnels have contracted fairly significantly. No significant slough or debris accumulation. There is still senescent epithelium accumulation around the borders of the wound. He has been tolerating hyperbaric oxygen therapy well. 11/12/2021: Despite the measurements of the wound being about the same, the wound has changed in its shape and overall, I think it is improved. The undermining has resolved and the tunnels continue to shorten. There is good granulation tissue encroaching over the small area of bone that has remained exposed at the 12 o'clock position. Minimal slough accumulation. He continues to tolerate hyperbaric oxygen therapy well. 11/19/2021: I took a PCR culture last week. There was overgrowth of yeast. He is already taking suppressive doxycycline and Augmentin. I added fluconazole to his regimen. The wound is smaller again today. The tunnels continue to shorten. He continues to do well with hyperbaric oxygen therapy. 11/26/2021: For some reason, his foot has become macerated. The wound is narrower but about the same dimensions in its longitudinal aspect. The tunnels continue to shorten. He has some slough buildup on the wound as well as some heaped up senescent epithelium around the perimeter. 12/03/2021: No further maceration of his foot has occurred. The wound has contracted quite significantly from last week. The tunnel at 10:00 is closed. The tunnel at 12:00 is down to just a couple of millimeters. No other undermining is present. There is soft tissue  coverage of the previously exposed bone. There is just a bit of slough and biofilm on the wound surface. 12/10/2021: The wound is looking good. It turns out the tunnel at 12:00 is only exposed when the patient dorsiflexes his foot. It is about 2 cm in depth when he does this; when his foot is in plantarflexion, the tunnel is closed. The bone that was visible at the 12:00 tunnel is completely covered with granulation tissue, but there does feel like some exposed bone deeper into the tunnel area. There is senescent skin heaped up around the periphery. Minimal slough on the wound surface. Electronic Signature(s) Signed: 12/10/2021 12:11:31 PM By: [name] MD FACS Entered By: [name] on 12/10/2021 12:11:31 -------------------------------------------------------------------------------- Physical Exam Details Patient Name: Date of Service: [name] 12/10/2021 12:45 PM Medical Record Number: [id number] Patient Account Number: [id number] Date of Birth/Sex: Treating RN: [birth year] (66 y.o. [name] Primary Care Provider: [name] Other Clinician: Referring Provider: Treating Provider/Extender: [name]: 15 Constitutional . . . . No acute distress.Marland Kitchen Respiratory Normal work of breathing on room air.. Notes 12/10/2021: The wound is looking good. It turns out the tunnel at 12:00 is only exposed when the patient dorsiflexes his foot. It is about 2 cm in depth when he does this; when his foot is in plantarflexion, the tunnel is closed. The bone that was visible at the 12:00 tunnel is completely covered with granulation tissue, but there does feel like some exposed bone deeper into the tunnel area. There is senescent skin heaped up around the periphery. Minimal slough on the wound surface. Electronic Signature(s) Signed: 12/10/2021 12:13:47 PM By: [name] MD FACS Entered By: [name] on 12/10/2021  12:13:47 -------------------------------------------------------------------------------- Physician Orders Details Patient Name:  Date of Service: Jerelyn Scott 12/10/2021 12:45 PM Medical Record Number: [id number] Patient Account Number: [id number] Date of Birth/Sex: Treating RN: March 13, 1956 (66 y.o. 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 - Dr. Lady Gary - Room 2 Return A Bathing/ Shower/ Hygiene May shower with protection but do not get wound dressing(s) wet. Negative Presssure Wound Therapy Wound Vac to wound continuously at 152mm/hg pressure Black Foam White Foam - Apply white foam or saline moistened gauze to tunnel at 10:00 and 12:00 Other: - Apply silver collagen on wound bed, under black foam Home Health No change in wound care orders this week; continue Home Health for wound care. May utilize formulary equivalent dressing for wound treatment orders unless otherwise specified. Dressing changes to be completed by Home Health on Monday / Wednesday / Friday except when patient has scheduled visit at [facility]. Other Home Health Orders/Instructions: [name]: Change vac 3x week Mon. Wed. and Fri. after wound care visit. Hyperbaric Oxygen Therapy Evaluate for HBO Therapy Indication: - Wagner 3 diabetic ulcer left foot If appropriate for treatment, begin HBOT per protocol: 2.0 ATA for 90  Minutes without A Breaks - 12/10/2021 ONLY ir 2.5 ATA for 90 Minutes with 2 Five (5) Minute A Breaks ir Total Number of Treatments: - 11/05/2021 additional 40 treatments. T of 80 otal One treatments per day (delivered Monday through Friday unless otherwise specified in Special Instructions below): Finger stick Blood Glucose Pre- and Post- HBOT Treatment. Follow Hyperbaric Oxygen Glycemia Protocol A frin (Oxymetazoline HCL) 0.05% nasal spray - 1 spray in both nostrils daily as needed prior to HBO treatment for difficulty clearing ears Wound Treatment Wound #1 - Foot Wound Laterality: Left, Medial Cleanser: Wound Cleanser 3 x Per Week/30 Days Discharge Instructions: Cleanse the wound with wound cleanser prior to applying a clean dressing using gauze sponges, not tissue or cotton balls. Peri-Wound Care: Skin Prep 3 x Per Week/30 Days Discharge Instructions: Use skin prep as directed Peri-Wound Care: Zinc Oxide Ointment 30g tube 3 x Per Week/30 Days Discharge Instructions: Apply Zinc Oxide to periwound with each dressing change Prim Dressing: Promogran Prisma Matrix, 4.34 (sq in) (silver collagen) 3 x Per Week/30 Days ary Discharge Instructions: Moisten collagen with saline or hydrogel Secondary Dressing: ABD Pad, 5x9 3 x Per Week/30 Days Discharge Instructions: In clinic. Home Health to apply wound vac. Secondary Dressing: Woven Gauze Sponge, Non-Sterile 4x4 in 3 x Per Week/30 Days Discharge Instructions: In clinic. Home Health to apply wound vac. Secured With: [employer], 4.5x3.1 (in/yd) 3 x Per Week/30 Days Discharge Instructions: In clinic. Home Health to apply wound vac. Secured With: 5M Medipore H Soft Cloth Surgical T ape, 4 x 10 (in/yd) 3 x Per Week/30 Days Discharge Instructions: In clinic. Home Health to apply wound vac. GLYCEMIA INTERVENTIONS PROTOCOL PRE-HBO GLYCEMIA INTERVENTIONS ACTION INTERVENTION Obtain pre-HBO capillary blood glucose (ensure 1 physician order is  in chart). A. Notify HBO physician and await physician orders. 2 If result is 70 mg/dl or below: B. If the  result meets the hospital definition of a critical result, follow hospital policy. A. Give patient an 8 ounce Glucerna Shake, an 8 ounce Ensure, or 8 ounces of a Glucerna/Ensure equivalent dietary supplement*. B. Wait 30 minutes. If result is 71 mg/dl to 253 mg/dl: C. Retest patients capillary blood glucose (CBG). D. If result greater than or equal to 110 mg/dl, proceed with HBO. If result less than 110 mg/dl, notify HBO physician and consider holding HBO. If result is 131 mg/dl to 664 mg/dl: A. Proceed with HBO. A. Notify HBO physician and await physician orders. B. It is recommended to hold HBO and do If result is 250 mg/dl or greater: blood/urine ketone testing. C. If the result meets the hospital definition of a critical result, follow hospital policy. POST-HBO GLYCEMIA INTERVENTIONS ACTION INTERVENTION Obtain post HBO capillary blood glucose (ensure 1 physician order is in chart). A. Notify HBO physician and await physician orders. 2 If result is 70 mg/dl or below: B. If the result meets the hospital definition of a critical result, follow hospital policy. A. Give patient an 8 ounce Glucerna Shake, an 8 ounce Ensure, or 8 ounces of a Glucerna/Ensure equivalent dietary supplement*. B. Wait 15 minutes for symptoms of If result is 71 mg/dl to 403 mg/dl: hypoglycemia (i.e. nervousness, anxiety, sweating, chills, clamminess, irritability, confusion, tachycardia or dizziness). C. If patient asymptomatic, discharge patient. If patient symptomatic, repeat capillary blood glucose (CBG) and notify HBO physician. If result is 101 mg/dl to 474 mg/dl: A. Discharge patient. A. Notify HBO physician and await physician orders. B. It is recommended to do blood/urine ketone If result is 250 mg/dl or greater: testing. C. If the result meets the hospital definition of  a critical result, follow hospital policy. *Juice or candies are NOT equivalent products. If patient refuses the Glucerna or Ensure, please consult the hospital dietitian for an appropriate substitute. Electronic Signature(s) Signed: 12/10/2021 1:53:54 PM By: [name] MD FACS Signed: 12/10/2021 5:03:19 PM By: Samuella Bruin Previous Signature: 12/10/2021 12:20:34 PM Version By: [name] MD FACS Entered By: Samuella Bruin on 12/10/2021 13:07:41 -------------------------------------------------------------------------------- Problem List Details Patient Name: Date of Service: NA [name] 12/10/2021 12:45 PM Medical Record Number: [id number] Patient Account Number: [id number] Date of Birth/Sex: Treating RN: December 27, 1955 (66 y.o. 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 12/10/2021 12:45 PM Medical Record Number: [id number] Patient Account Number: [id number] Date of Birth/Sex: Treating RN: [birth year] (66 y.o. [name] Primary Care Provider: [name] Other Clinician: Referring Provider: Treating Provider/Extender: [name] in Treatment: 15 Subjective Chief Complaint Information obtained from Patient Patients presents for treatment of an open diabetic ulcer with exposed bone and osteomyelitis History of Present Illness (HPI) ADMISSION 08/25/2021 This is a 66 year old man who initially presented  to his primary care provider in September 2022 with pain in his left foot. He was sent for an x-ray and while the x-ray was being performed, the tech pointed out a wound on his foot that the patient was not aware existed. He does have type 2 diabetes with significant neuropathy. His diabetes is suboptimally controlled with his most recent A1c being 8.5. He also has a history of coronary artery disease status post three- vessel CABG. he was initially seen by orthopedics, but they referred him to [facility]. He has undergone at least 7 operations/debridements and several applications of skin substitute under the care of podiatry. He has been in a wound VAC for much of this time. His most recent procedure was July 28, 2021. A portion of the talus was biopsied and was found to be consistent with osteomyelitis. Culture also returned positive for corynebacterium. He was seen on August 16, 2021 by infectious disease. A PICC line has been placed and he will be receiving a 6-week course of IV daptomycin and cefepime. In October 2022, he underwent lower extremity vascular studies. Results are copied here: Right: Resting right ankle-brachial index is within normal range. No evidence of significant right lower extremity arterial disease. The right toe-brachial index is abnormal. Left: Resting left ankle-brachial index indicates mild left lower extremity arterial disease. The left toe-brachial index is abnormal. He has not been seen by vascular surgery despite  these findings. He presented to clinic today in a cam boot and is using a knee scooter to offload. Wound VAC was in place. Once this was removed, a large ulcer was identified on the left midfoot/ankle. Bone is frankly exposed. There is no malodorous or purulent drainage. There is some granulation tissue over the central portion of the exposed bone. There is a tunnel that extends posteriorly for roughly 10 cm. It has been discussed with him by multiple providers that he is at very high risk of losing his lower leg because of this wound. He is extremely eager to avoid this outcome and is here today to review his options as well as receive ongoing wound care. 09/03/2021: Here for reevaluation of his wound. There does not appear to have been any substantial improvement overall since our last visit. He has been in a wound VAC with white foam overlying the exposed bone. We are working on getting him approved for hyperbaric oxygen therapy. 09/10/2021: We are in the process of getting him cleared to begin hyperbaric oxygen therapy. He still needs to obtain a chest x-ray. Although the wound measurements are roughly the same, I think the overall appearance of the wound is better. The exposed bone has a bit more granulation tissue covering it. He has not received a vascular surgery appointment to reevaluate his flow to the wound. 09/17/2021: He has been approved for hyperbaric oxygen therapy and completed his chest x-ray, which I reviewed and it appears normal. The tunnels at the 12 and 10:00 positions are smaller. There is more granulation tissue covering the exposed bone and the undermining has decreased. He still has not received a vascular surgery appointment. 09/24/2021: He initiated hyperbaric oxygen therapy this week and is tolerating it well. He has an appointment with vascular surgery coming up on May 16. The granulation tissue is covering more of the exposed bone and both tunnels are a bit  smaller. 10/01/2021: He continues to tolerate hyperbaric oxygen therapy. He saw infectious disease and they are planning to pull his PICC line. He  has been initiated on oral antibiotics (doxycycline and Augmentin). The wound looks about the same but the tunnels are a little bit smaller. The skin seems to be contracting somewhat around the exposed bone. 10/08/2021: The wound is still about the same size, but the tunnels continue to come in and the skin is contracting around the exposed bone. He continues to have some accumulation of necrotic material in the inferoposterior aspect of the wound as well as accumulation at the 12:00 tunnel area. 10/15/2021: The wound is smaller today. The tunnels continue to come in. There is less necrotic tissue present. He does have some periwound maceration. 10/22/2021: The wound is about the same size. There is a little bit less undermining at the distal portion. The exposed bone is dark and I am not sure if this is staining from silver nitrate or his VAC sponge or if it represents necrosis. The tunnels are shallower but he does have some serous drainage coming from the 10:00 tunnel. He continues to tolerate hyperbaric oxygen therapy well. 10/29/2021: The undermining continues to improve. The tunnels are about the same. He has good granulation tissue overlying the majority of the exposed bone. It does appear that perhaps the tubing from his wound VAC has been eroding the skin at the 12 clock position. He continues to accumulate senescent epithelium around the borders of the wound. 11/05/2021: The undermining is almost completely resolved. The tunnels have contracted fairly significantly. No significant slough or debris accumulation. There is still senescent epithelium accumulation around the borders of the wound. He has been tolerating hyperbaric oxygen therapy well. 11/12/2021: Despite the measurements of the wound being about the same, the wound has changed in its shape and  overall, I think it is improved. The undermining has resolved and the tunnels continue to shorten. There is good granulation tissue encroaching over the small area of bone that has remained exposed at the 12 o'clock position. Minimal slough accumulation. He continues to tolerate hyperbaric oxygen therapy well. 11/19/2021: I took a PCR culture last week. There was overgrowth of yeast. He is already taking suppressive doxycycline and Augmentin. I added fluconazole to his regimen. The wound is smaller again today. The tunnels continue to shorten. He continues to do well with hyperbaric oxygen therapy. 11/26/2021: For some reason, his foot has become macerated. The wound is narrower but about the same dimensions in its longitudinal aspect. The tunnels continue to shorten. He has some slough buildup on the wound as well as some heaped up senescent epithelium around the perimeter. 12/03/2021: No further maceration of his foot has occurred. The wound has contracted quite significantly from last week. The tunnel at 10:00 is closed. The tunnel at 12:00 is down to just a couple of millimeters. No other undermining is present. There is soft tissue coverage of the previously exposed bone. There is just a bit of slough and biofilm on the wound surface. 12/10/2021: The wound is looking good. It turns out the tunnel at 12:00 is only exposed when the patient dorsiflexes his foot. It is about 2 cm in depth when he does this; when his foot is in plantarflexion, the tunnel is closed. The bone that was visible at the 12:00 tunnel is completely covered with granulation tissue, but there does feel like some exposed bone deeper into the tunnel area. There is senescent skin heaped up around the periphery. Minimal slough on the wound surface. Patient History Information obtained from Patient. Family History Cancer - Father, Diabetes - Father,Mother,Paternal Grandparents, Heart  Disease - Father, Hypertension - Father, No family  history of Hereditary Spherocytosis, Kidney Disease, Lung Disease, Seizures, Stroke, Thyroid Problems, Tuberculosis. Social History Never smoker, Marital Status - Married, Alcohol Use - Rarely, Drug Use - No History, Caffeine Use - Daily. Medical History Eyes Patient has history of Cataracts - Removed 2008 Cardiovascular Patient has history of Coronary Artery Disease, Hypertension, Myocardial Infarction, Peripheral Arterial Disease Endocrine Patient has history of Type II Diabetes Musculoskeletal Patient has history of Osteomyelitis Neurologic Patient has history of Neuropathy Medical A Surgical History Notes nd Cardiovascular Hypercholesterolemia Abnormal EKG CABG X3 2019 Gastrointestinal GERD Musculoskeletal Diabetic foot ulcer Objective Constitutional No acute distress.. Vitals Time Taken: 11:41 AM, Height: 74 in, Weight: 186 lbs, BMI: 23.9, Temperature: 97.7 F, Pulse: 72 bpm, Respiratory Rate: 16 breaths/min, Blood Pressure: 123/95 mmHg. Respiratory Normal work of breathing on room air.. General Notes: 12/10/2021: The wound is looking good. It turns out the tunnel at 12:00 is only exposed when the patient dorsiflexes his foot. It is about 2 cm in depth when he does this; when his foot is in plantarflexion, the tunnel is closed. The bone that was visible at the 12:00 tunnel is completely covered with granulation tissue, but there does feel like some exposed bone deeper into the tunnel area. There is senescent skin heaped up around the periphery. Minimal slough on the wound surface. Integumentary (Hair, Skin) Wound #1 status is Open. Original cause of wound was Gradually Appeared. The date acquired was: 02/22/2021. The wound has been in treatment 15 weeks. The wound is located on the Left,Medial Foot. The wound measures 6cm length x 2.7cm width x 0.4cm depth; 12.723cm^2 area and 5.089cm^3 volume. There is bone and Fat Layer (Subcutaneous Tissue) exposed. There is no undermining  noted, however, there is tunneling at 1:00 with a maximum distance of 2.4cm. There is a medium amount of serosanguineous drainage noted. The wound margin is distinct with the outline attached to the wound base. There is small (1-33%) red granulation within the wound bed. There is a large (67-100%) amount of necrotic tissue within the wound bed including Adherent Slough. Wound #1 status is Open. Original cause of wound was Gradually Appeared. The date acquired was: 02/22/2021. The wound has been in treatment 15 weeks. The wound is located on the Left,Medial Foot. The wound measures 6cm length x 2.7cm width x 0.4cm depth; 12.723cm^2 area and 5.089cm^3 volume. There is bone and Fat Layer (Subcutaneous Tissue) exposed. There is no undermining noted, however, there is tunneling at 1:00 with a maximum distance of 2.4cm. There is a medium amount of serosanguineous drainage noted. The wound margin is distinct with the outline attached to the wound base. There is small (1-33%) red granulation within the wound bed. There is a large (67-100%) amount of necrotic tissue within the wound bed including Adherent Slough. 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 There was a Selective/Open Wound Skin/Epidermis Debridement with a total area of 16.2 sq cm performed by [name], MD. With the following instrument(s): Curette to remove Non-Viable tissue/material. Material removed includes [facility] and Skin: Epidermis and after achieving  pain control using Lidocaine 4% Topical Solution. No specimens were taken. A time out was conducted at 11:54, prior to the start of the procedure. A Minimum amount of bleeding was controlled with Pressure. The procedure was tolerated well with a pain level of 0 throughout and a pain level of 0 following the procedure. Post Debridement Measurements: 6cm length x 2.7cm width x 0.4cm depth; 5.089cm^3 volume. Character of Wound/Ulcer Post Debridement is improved. Severity of Tissue Post Debridement is: Fat layer exposed. Post procedure Diagnosis Wound #1: Same as Pre-Procedure Plan Follow-up Appointments: Return Appointment in 1 week. - Dr. Lady Gary - Room 2 Bathing/ Shower/ Hygiene: May shower with protection but do not get wound dressing(s) wet. Negative Presssure Wound Therapy: Wound Vac to wound continuously at 131mm/hg pressure Black Foam White Foam - Apply white foam or saline moistened gauze to tunnel at 10:00 and 12:00 Other: - Apply silver collagen on wound bed, under black foam Home Health: No change in wound care orders this week; continue Home Health for wound care. May utilize formulary equivalent dressing for wound treatment orders unless otherwise specified. Dressing changes to be completed by Home Health on Monday / Wednesday / Friday except when patient has scheduled visit at [facility]. Other Home Health Orders/Instructions: [name]: Change vac 3x week Mon. Wed. and Fri. after wound care visit. Hyperbaric Oxygen Therapy: Evaluate for HBO Therapy Indication: - Wagner 3 diabetic ulcer left foot If appropriate for treatment, begin HBOT per protocol: 2.0 ATA for 90 Minutes without Air Breaks - 12/10/2021 ONLY 2.5 ATA for 90 Minutes with 2 Five (5) Minute Air Breaks T Number of Treatments: - 11/05/2021 additional 40 treatments. T of 80 otal otal One treatments per day (delivered Monday through Friday unless otherwise specified in Special Instructions below): Finger stick  Blood Glucose Pre- and Post- HBOT Treatment. Follow Hyperbaric Oxygen Glycemia Protocol Afrin (Oxymetazoline HCL) 0.05% nasal spray - 1 spray in both nostrils daily as needed prior to HBO treatment for difficulty clearing ears WOUND #1: - Foot Wound Laterality: Left, Medial Cleanser: Wound Cleanser 3 x Per Week/30 Days Discharge Instructions: Cleanse the wound with wound cleanser prior to applying a clean dressing using gauze sponges, not tissue or cotton balls. Peri-Wound Care: Skin Prep 3 x Per Week/30 Days Discharge Instructions: Use skin prep as directed Peri-Wound Care: Zinc Oxide Ointment 30g tube 3 x Per Week/30 Days Discharge Instructions: Apply Zinc Oxide to periwound with each dressing change Prim Dressing: Promogran Prisma Matrix, 4.34 (sq in) (silver collagen) 3 x Per Week/30 Days ary Discharge Instructions: Moisten collagen with saline or hydrogel Secondary Dressing: ABD Pad, 5x9 3 x Per Week/30 Days Discharge Instructions: In clinic. Home Health to apply wound vac. Secondary Dressing: Woven Gauze Sponge, Non-Sterile 4x4 in 3 x Per Week/30 Days Discharge Instructions: In clinic. Home Health to apply wound vac. Secured With: [employer], 4.5x3.1 (in/yd) 3 x Per Week/30 Days Discharge Instructions: In clinic. Home Health to apply wound vac. Secured With: 51M Medipore H Soft Cloth Surgical T ape, 4 x 10 (in/yd) 3 x Per Week/30 Days Discharge Instructions: In clinic. Home Health to apply wound vac. 12/10/2021: The wound is looking good. It turns out the tunnel at 12:00 is only exposed when the patient dorsiflexes his foot. It is about 2 cm in depth when he does this; when his foot is in plantarflexion, the tunnel is closed. The bone that was visible at the 12:00 tunnel is completely covered with granulation tissue, but there  does feel like some exposed bone deeper into the tunnel area. There is senescent skin heaped up around the periphery. Minimal slough on the  wound surface. I used a curette to debride slough from the wound surface as well as the senescent skin from the perimeter. We will continue using his wound VAC with the silver collagen and continue his hyperbaric oxygen therapy. Follow-up in 1 week. Electronic Signature(s) Signed: 12/10/2021 12:15:08 PM By: [name] MD FACS Entered By: [name] on 12/10/2021 12:15:08 -------------------------------------------------------------------------------- HxROS Details Patient Name: Date of Service: NA [name] 12/10/2021 12:45 PM Medical Record Number: [id number] Patient Account Number: [id number] Date of Birth/Sex: Treating RN: Oct 10, 1955 (66 y.o. [name] Primary Care Provider: [name] Other Clinician: Referring Provider: Treating Provider/Extender: [name]: 15 Information Obtained From Patient Eyes Medical History: Positive for: Cataracts - Removed 2008 Cardiovascular Medical History: Positive for: Coronary Artery Disease; Hypertension; Myocardial Infarction; Peripheral Arterial Disease Past Medical History Notes: Hypercholesterolemia Abnormal EKG CABG X3 2019 Gastrointestinal Medical History: Past Medical History Notes: GERD Endocrine Medical History: Positive for: Type II Diabetes Time with diabetes: 24 years Treated with: Insulin, Oral agents Blood sugar tested every day: Yes Tested : Musculoskeletal Medical History: Positive for: Osteomyelitis Past Medical History Notes: Diabetic foot ulcer Neurologic Medical History: Positive for: Neuropathy HBO Extended History Items Eyes: Cataracts Immunizations Pneumococcal Vaccine: Received Pneumococcal Vaccination: Yes Received Pneumococcal Vaccination On or After 60th Birthday: Yes Implantable Devices Yes Family and Social History Cancer: Yes - Father; Diabetes: Yes - Father,Mother,Paternal Grandparents; Heart Disease: Yes - Father; Hereditary  Spherocytosis: No; Hypertension: Yes - Father; Kidney Disease: No; Lung Disease: No; Seizures: No; Stroke: No; Thyroid Problems: No; Tuberculosis: No; Never smoker; Marital Status - Married; Alcohol Use: Rarely; Drug Use: No History; Caffeine Use: Daily; Financial Concerns: No; Food, Clothing or Shelter Needs: No; Support System Lacking: No; Transportation Concerns: No Electronic Signature(s) Signed: 12/10/2021 12:20:34 PM By: [name] MD FACS Signed: 12/10/2021 5:03:19 PM By: Gelene Mink By: [name] on 12/10/2021 12:13:24 -------------------------------------------------------------------------------- SuperBill Details Patient Name: Date of Service: NA [name] 12/10/2021 Medical Record Number: [id number] Patient Account Number: [id number] Date of Birth/Sex: Treating RN: 1956/05/05 (66 y.o. 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 Facility Procedures Physician Procedures : CPT4 Code Description Modifier [id number] - WC PHYS LEVEL 3 - EST PT 25 ICD-10 Diagnosis Description L97.324 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

## 2021-12-10 NOTE — Progress Notes (Addendum)
[name], [name] ([id number]) Visit Report for 12/10/2021 Physician Orders Details Patient Name: Date of Service: NA [name] 12/10/2021 10:00 A M Medical Record Number: [id number] Patient Account Number: [id number] Date of Birth/Sex: Treating RN: 31-Jan-1956 (66 y.o. M) Primary Care Provider: [name] Other Clinician: Referring Provider: Treating Provider/Extender: [name]: 15 Verbal / Phone Orders: No Diagnosis Coding Hyperbaric Oxygen Therapy 2.0 ATA for 90 Minutes without A Breaks - 12/10/2021 ONLY ir Wound Treatment Electronic Signature(s) Signed: 12/10/2021 11:00:22 AM By: [name] MD FACS Entered By: [name] on 12/10/2021 11:00:21 -------------------------------------------------------------------------------- Problem List Details Patient Name: Date of Service: NA Colin Nephew A. 12/10/2021 10:00 A M Medical Record Number: [id number] Patient Account Number: [id number] Date of Birth/Sex: Treating RN: 1955-07-08 (66 y.o. 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 12/10/2021 Medical Record Number: [id number] Patient Account Number: [id number] Date of Birth/Sex: Treating RN: [birth year] (66 y.o. 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 Facility Procedures CPT4 Code: [id number] Description: G0277-(Facility Use Only) HBOT full body chamber, , ICD-10 Diagnosis Description [phone] Other chronic osteomyelitis, left ankle and foot E11.610 Type 2 diabetes mellitus with diabetic neuropathic arthropathy E11.621 Type 2 diabetes  mellitus with foot ulcer I25.10 Atherosclerotic heart disease of native coronary artery without angina pectoris Modifier: Quantity: 3 Physician Procedures : CPT4 Code Description Modifier [id number] - WC PHYS HYPERBARIC OXYGEN THERAPY ICD-10 Diagnosis Description M86.672 Other chronic osteomyelitis, left ankle and foot E11.610 Type 2 diabetes mellitus with diabetic neuropathic arthropathy E11.621 Type 2  diabetes mellitus with foot ulcer I25.10 Atherosclerotic heart disease of native coronary artery without angina pectoris Quantity: 1 Electronic Signature(s) Signed: 12/10/2021 3:29:39 PM By: [name] EMT Signed: 12/10/2021 4:38:32 PM By: [name] MD FACS Entered By: [name] on 12/10/2021 15:29:38

## 2021-12-10 NOTE — Progress Notes (Signed)
[name] ([id number]) Visit Report for 12/10/2021 Arrival Information Details Patient Name: Date of Service: NA [name] 12/10/2021 12:45 PM Medical Record Number: [id number] Patient Account Number: [id number] Date of Birth/Sex: Treating RN: Feb 16, 1956 (66 y.o. [name] Primary Care [name]: [name] Other Clinician: Referring [name]: Treating [name]/Extender: [name] in Treatment: 15 Visit Information History Since Last Visit Added or deleted any medications: No Patient Arrived: Knee Scooter Any new allergies or adverse reactions: No Arrival Time: 11:40 Had a fall or experienced change in No Accompanied By: self activities of daily living that may affect Transfer Assistance: None risk of falls: Patient Identification Verified: Yes Signs or symptoms of abuse/neglect since last visito No Secondary Verification Process Completed: Yes Hospitalized since last visit: No Patient Requires Transmission-Based Precautions: No Implantable device outside of the clinic excluding No Patient Has Alerts: Yes cellular tissue based products placed in the center Patient Alerts: Patient on Blood Thinner since last visit: Has Dressing in Place as Prescribed: Yes Pain Present Now: No Electronic Signature(s) Signed: 12/10/2021 2:36:56 PM By: Sandre Kitty Entered By: Sandre Kitty on 12/10/2021 11:41:03 -------------------------------------------------------------------------------- Encounter Discharge Information Details Patient Name: Date of Service: [name] 12/10/2021 12:45 PM Medical Record Number: [id number] Patient Account Number: [id number] Date of Birth/Sex: Treating RN: [birth year] (66 y.o. [name] Primary Care [name]: [name] Other Clinician: Referring Atheena Spano: Treating [name]/Extender: [name] in Treatment: 15 Encounter Discharge Information Items Post  Procedure Vitals Discharge Condition: Stable Temperature (F): 97.7 Ambulatory Status: Knee Scooter Pulse (bpm): 72 Discharge Destination: Home Respiratory Rate (breaths/min): 16 Transportation: Private Auto Blood Pressure (mmHg): 123/95 Accompanied By: self Schedule Follow-up Appointment: Yes Clinical Summary of Care: Patient Declined Electronic Signature(s) Signed: 12/10/2021 5:03:19 PM By: Adline Peals Entered By: Adline Peals on 12/10/2021 13:08:28 -------------------------------------------------------------------------------- Lower Extremity Assessment Details Patient Name: Date of Service: Colin Loveless A. 12/10/2021 12:45 PM Medical Record Number: [id number] Patient Account Number: [id number] Date of Birth/Sex: Treating RN: Oct 17, 1955 (66 y.o. [name] Primary Care Kylani Wires: [name] Other Clinician: Referring Chancey Cullinane: Treating Sammye Staff/Extender: [name] Weeks in Treatment: 15 Edema Assessment Assessed: Shirlyn Goltz: No] [Right: No] Edema: [Left: Ye] [Right: s] Calf Left: Right: Point of Measurement: From Medial Instep 35 cm Ankle Left: Right: Point of Measurement: From Medial Instep 24.4 cm Vascular Assessment Pulses: Dorsalis Pedis Palpable: [Left:Yes] Electronic Signature(s) Signed: 12/10/2021 5:03:19 PM By: Adline Peals Entered By: Adline Peals on 12/10/2021 11:50:14 -------------------------------------------------------------------------------- Multi Wound Chart Details Patient Name: Date of Service: Colin Loveless A. 12/10/2021 12:45 PM Medical Record Number: [id number] Patient Account Number: [id number] Date of Birth/Sex: Treating RN: 05/04/1956 (66 y.o. [name] Primary Care Tyrion Glaude: [name] Other Clinician: Referring Anacarolina Evelyn: Treating Florie [name]/Extender: [name] Weeks in Treatment: 15 Vital Signs Height(in): 75 Pulse(bpm): 35 Weight(lbs): 186 Blood  Pressure(mmHg): 123/95 Body Mass Index(BMI): 23.9 Temperature(F): 97.7 Respiratory Rate(breaths/min): 16 Photos: [1:No Photos Left, Medial Foot] [N/A:N/A Left, Medial Foot N/A] Wound Location: [1:Gradually Appeared] [N/A:Gradually Appeared N/A] Wounding Event: [1:Diabetic Wound/Ulcer of the Lower] [N/A:Diabetic Wound/Ulcer of the Lower N/A] Primary Etiology: [1:Extremity Cataracts, Coronary Artery Disease, Cataracts, Coronary Artery Disease, N/A] [N/A:Extremity] Comorbid History: [1:Hypertension, Myocardial Infarction, Hypertension, Myocardial Infarction, Peripheral Arterial Disease, Type II Peripheral Arterial Disease, Type II Diabetes, Osteomyelitis, Neuropathy Diabetes, Osteomyelitis, Neuropathy 02/22/2021]  [N/A:02/22/2021 N/A] Date Acquired: [1:15] [N/A:15 N/A] Weeks of Treatment: [1:Open] [N/A:Open N/A] Wound Status: [1:No] [N/A:No N/A] Wound Recurrence: [  1:6x2.7x0.4] [N/A:6x2.7x0.4 N/A] Measurements L x W x D (cm) [1:12.723] [N/A:12.723 N/A] A (cm) : rea [1:5.089] [N/A:5.089 N/A] Volume (cm) : [1:10.00%] [N/A:10.00% N/A] % Reduction in A [1:rea: 64.00%] [N/A:64.00% N/A] % Reduction in Volume: [1:1] [N/A:1] Position 1 (o'clock): [1:2.4] [N/A:2.4] Maximum Distance 1 (cm): [1:Yes] [N/A:Yes N/A] Tunneling: [1:Grade 3] [N/A:Grade 3 N/A] Classification: [1:Medium] [N/A:Medium N/A] Exudate A mount: [1:Serosanguineous] [N/A:Serosanguineous N/A] Exudate Type: [1:red, brown] [N/A:red, brown N/A] Exudate Color: [1:Distinct, outline attached] [N/A:Distinct, outline attached N/A] Wound Margin: [1:Small (1-33%)] [N/A:Small (1-33%) N/A] Granulation A mount: [1:Red] [N/A:Red N/A] Granulation Quality: [1:Large (67-100%)] [N/A:Large (67-100%) N/A] Necrotic A mount: [1:Fat Layer (Subcutaneous Tissue): Yes Fat Layer (Subcutaneous Tissue): Yes N/A] Exposed Structures: [1:Bone: Yes Fascia: No Tendon: No Muscle: No Joint: No Small (1-33%)] [N/A:Bone: Yes Fascia: No Tendon: No Muscle: No Joint: No  Small (1-33%) N/A] Epithelialization: [1:Debridement - Selective/Open Wound Debridement - Selective/Open Wound N/A] Debridement: Pre-procedure Verification/Time Out 11:54 [N/A:11:54 N/A] Taken: [1:Lidocaine 4% Topical Solution] [N/A:Lidocaine 4% Topical Solution N/A] Pain Control: [1:Slough] [N/A:Slough N/A] Tissue Debrided: [1:Skin/Epidermis] [N/A:Skin/Epidermis N/A] Level: [1:16.2] [N/A:16.2 N/A] Debridement A (sq cm): [1:rea Curette] [N/A:Curette N/A] Instrument: [1:Minimum] [N/A:Minimum N/A] Bleeding: [1:Pressure] [N/A:Pressure N/A] Hemostasis A chieved: [1:0] [N/A:0 N/A] Procedural Pain: [1:0] [N/A:0 N/A] Post Procedural Pain: [1:Procedure was tolerated well] [N/A:Procedure was tolerated well N/A] Debridement Treatment Response: [1:6x2.7x0.4] [N/A:6x2.7x0.4 N/A] Post Debridement Measurements L x W x D (cm) [1:5.089] [N/A:5.089 N/A] Post Debridement Volume: (cm) [1:Debridement] [N/A:Debridement N/A] Treatment Notes Electronic Signature(s) Signed: 12/10/2021 12:09:49 PM By: [name] MD FACS Signed: 12/10/2021 5:03:19 PM By: Adline Peals Entered By: [name] on 12/10/2021 12:09:48 -------------------------------------------------------------------------------- Multi-Disciplinary Care Plan Details Patient Name: Date of Service: [name] 12/10/2021 12:45 PM Medical Record Number: [id number] Patient Account Number: [id number] Date of Birth/Sex: Treating RN: 10-19-1955 (66 y.o. [name] Primary Care Avyon Herendeen: [name] Other Clinician: Referring Garnetta Fedrick: Treating Suzzane Quilter/Extender: [name] in Treatment: 15 Multidisciplinary Care Plan reviewed with physician Active Inactive HBO Nursing Diagnoses: Anxiety related to feelings of confinement associated with the hyperbaric oxygen chamber Anxiety related to knowledge deficit of hyperbaric oxygen therapy and treatment procedures Discomfort related to temperature  and humidity changes inside hyperbaric chamber Potential for barotraumas to ears, sinuses, teeth, and lungs or cerebral gas embolism related to changes in atmospheric pressure inside hyperbaric oxygen chamber Potential for oxygen toxicity seizures related to delivery of 100% oxygen at an increased atmospheric pressure Potential for pulmonary oxygen toxicity related to delivery of 100% oxygen at an increased atmospheric pressure Goals: Barotrauma will be prevented during HBO2 Date Initiated: 09/17/2021 T arget Resolution Date: 01/07/2022 Goal Status: Active Patient will tolerate the hyperbaric oxygen therapy treatment Date Initiated: 09/17/2021 T arget Resolution Date: 01/07/2022 Goal Status: Active Patient will tolerate the internal climate of the chamber Date Initiated: 09/17/2021 T arget Resolution Date: 01/07/2022 Goal Status: Active Patient/caregiver will verbalize understanding of HBO goals, rationale, procedures and potential hazards Date Initiated: 09/17/2021 T arget Resolution Date: 01/07/2022 Goal Status: Active Signs and symptoms of pulmonary oxygen toxicity will be recognized and promptly addressed Date Initiated: 09/17/2021 T arget Resolution Date: 01/07/2022 Goal Status: Active Signs and symptoms of seizure will be recognized and promptly addressed ; seizing patients will suffer no harm Date Initiated: 09/17/2021 T arget Resolution Date: 01/07/2022 Goal Status: Active Interventions: Administer decongestants, per physician orders, prior to HBO2 Administer the correct therapeutic gas delivery based on the patients needs and limitations, per physician order Assess and provide for patients comfort  related to the hyperbaric environment and equalization of middle ear Assess for signs and symptoms related to adverse events, including but not limited to confinement anxiety, pneumothorax, oxygen toxicity and baurotrauma Assess patient for any history of confinement anxiety Assess patient's  knowledge and expectations regarding hyperbaric medicine and provide education related to the hyperbaric environment, goals of treatment and prevention of adverse events Implement protocols to decrease risk of pneumothorax in high risk patients Notes: Nutrition Nursing Diagnoses: Impaired glucose control: actual or potential Goals: Patient/caregiver verbalizes understanding of need to maintain therapeutic glucose control per primary care physician Date Initiated: 08/25/2021 Target Resolution Date: 01/07/2022 Goal Status: Active Interventions: Assess HgA1c results as ordered upon admission and as needed Provide education on elevated blood sugars and impact on wound healing Notes: Osteomyelitis Nursing Diagnoses: Infection: osteomyelitis Knowledge deficit related to disease process and management Goals: Patient's osteomyelitis will resolve Date Initiated: 09/17/2021 Target Resolution Date: 01/07/2022 Goal Status: Active Interventions: Assess for signs and symptoms of osteomyelitis resolution every visit Provide education on osteomyelitis Treatment Activities: Surgical debridement : 09/17/2021 Systemic antibiotics : 09/17/2021 T ordered outside of clinic : 09/17/2021 est Notes: Wound/Skin Impairment Nursing Diagnoses: Impaired tissue integrity Goals: Patient/caregiver will verbalize understanding of skin care regimen Date Initiated: 08/25/2021 Target Resolution Date: 01/07/2022 Goal Status: Active Ulcer/skin breakdown will have a volume reduction of 30% by week 4 Date Initiated: 08/25/2021 Date Inactivated: 10/08/2021 Target Resolution Date: 10/20/2021 Goal Status: Met Interventions: Assess patient/caregiver ability to obtain necessary supplies Assess patient/caregiver ability to perform ulcer/skin care regimen upon admission and as needed Assess ulceration(s) every visit Provide education on ulcer and skin care Treatment Activities: Topical wound management initiated :  08/25/2021 Notes: Electronic Signature(s) Signed: 12/10/2021 5:03:19 PM By: Adline Peals Entered By: Adline Peals on 12/10/2021 11:51:03 -------------------------------------------------------------------------------- Pain Assessment Details Patient Name: Date of Service: Colin Loveless A. 12/10/2021 12:45 PM Medical Record Number: [id number] Patient Account Number: [id number] Date of Birth/Sex: Treating RN: 05-16-1956 (66 y.o. [name] Primary Care Elenor Wildes: [name] Other Clinician: Referring Brookelle Pellicane: Treating Lulia Schriner/Extender: [name] Weeks in Treatment: 15 Active Problems Location of Pain Severity and Description of Pain Patient Has Paino No Site Locations Pain Management and Medication Current Pain Management: Electronic Signature(s) Signed: 12/10/2021 2:36:56 PM By: Sandre Kitty Signed: 12/10/2021 5:03:19 PM By: Adline Peals Entered By: Sandre Kitty on 12/10/2021 11:41:23 -------------------------------------------------------------------------------- Patient/Caregiver Education Details Patient Name: Date of Service: NA [name] 6/30/2023andnbsp12:45 PM Medical Record Number: [id number] Patient Account Number: [id number] Date of Birth/Gender: Treating RN: [birth year] (66 y.o. [name] Primary Care Physician: [name] Other Clinician: Referring Physician: Treating Physician/Extender: [name] in Treatment: 15 Education Assessment Education Provided To: Patient Education Topics Provided Wound/Skin Impairment: Methods: Explain/Verbal Responses: Reinforcements needed, State content correctly Electronic Signature(s) Signed: 12/10/2021 5:03:19 PM By: Adline Peals Entered By: Adline Peals on 12/10/2021 11:51:17 -------------------------------------------------------------------------------- Wound Assessment Details Patient Name: Date of  Service: Colin Loveless A. 12/10/2021 12:45 PM Medical Record Number: [id number] Patient Account Number: [id number] Date of Birth/Sex: Treating RN: 07-01-1955 (66 y.o. [name] Primary Care Virgilio Broadhead: [name] Other Clinician: Referring Tashea Othman: Treating Caleel Kiner/Extender: [name] Weeks in Treatment: 15 Wound Status Wound Number: 1 Primary Diabetic Wound/Ulcer of the Lower Extremity Etiology: Wound Location: Left, Medial Foot Wound Open Wounding Event: Gradually Appeared Status: Date Acquired: 02/22/2021 Comorbid Cataracts, Coronary Artery Disease, Hypertension, Myocardial Weeks Of Treatment: 15 History: Infarction, Peripheral Arterial Disease, Type II Diabetes,  Clustered Wound: No Osteomyelitis, Neuropathy Wound Measurements Length: (cm) 6 Width: (cm) 2.7 Depth: (cm) 0.4 Area: (cm) 12.723 Volume: (cm) 5.089 % Reduction in Area: 10% % Reduction in Volume: 64% Epithelialization: Small (1-33%) Tunneling: Yes Position (o'clock): 1 Maximum Distance: (cm) 2.4 Undermining: No Wound Description Classification: Grade 3 Wound Margin: Distinct, outline attached Exudate Amount: Medium Exudate Type: Serosanguineous Exudate Color: red, brown Foul Odor After Cleansing: No Slough/Fibrino Yes Wound Bed Granulation Amount: Small (1-33%) Exposed Structure Granulation Quality: Red Fascia Exposed: No Necrotic Amount: Large (67-100%) Fat Layer (Subcutaneous Tissue) Exposed: Yes Necrotic Quality: Adherent Slough Tendon Exposed: No Muscle Exposed: No Joint Exposed: No Bone Exposed: Yes Electronic Signature(s) Signed: 12/10/2021 5:03:19 PM By: Adline Peals Entered By: Adline Peals on 12/10/2021 11:49:40 -------------------------------------------------------------------------------- Wound Assessment Details Patient Name: Date of Service: Colin Loveless A. 12/10/2021 12:45 PM Medical Record Number: [id number] Patient Account  Number: [id number] Date of Birth/Sex: Treating RN: 05/06/1956 (66 y.o. [name] Primary Care Shaynna Husby: [name] Other Clinician: Referring Naveah Brave: Treating Ladashia Demarinis/Extender: [name] Weeks in Treatment: 15 Wound Status Wound Number: 1 Primary Diabetic Wound/Ulcer of the Lower Extremity Etiology: Wound Location: Left, Medial Foot Wound Open Wounding Event: Gradually Appeared Status: Date Acquired: 02/22/2021 Comorbid Cataracts, Coronary Artery Disease, Hypertension, Myocardial Weeks Of Treatment: 15 History: Infarction, Peripheral Arterial Disease, Type II Diabetes, Clustered Wound: No Osteomyelitis, Neuropathy Photos Wound Measurements Length: (cm) 6 Width: (cm) 2.7 Depth: (cm) 0.4 Area: (cm) 12.723 Volume: (cm) 5.089 % Reduction in Area: 10% % Reduction in Volume: 64% Epithelialization: Small (1-33%) Tunneling: Yes Position (o'clock): 1 Maximum Distance: (cm) 2.4 Undermining: No Wound Description Classification: Grade 3 Wound Margin: Distinct, outline attached Exudate Amount: Medium Exudate Type: Serosanguineous Exudate Color: red, brown Foul Odor After Cleansing: No Slough/Fibrino Yes Wound Bed Granulation Amount: Small (1-33%) Exposed Structure Granulation Quality: Red Fascia Exposed: No Necrotic Amount: Large (67-100%) Fat Layer (Subcutaneous Tissue) Exposed: Yes Necrotic Quality: Adherent Slough Tendon Exposed: No Muscle Exposed: No Joint Exposed: No Bone Exposed: Yes Treatment Notes Wound #1 (Foot) Wound Laterality: Left, Medial Cleanser Wound Cleanser Discharge Instruction: Cleanse the wound with wound cleanser prior to applying a clean dressing using gauze sponges, not tissue or cotton balls. Peri-Wound Care Skin Prep Discharge Instruction: Use skin prep as directed Zinc Oxide Ointment 30g tube Discharge Instruction: Apply Zinc Oxide to periwound with each dressing change Topical Primary Dressing Promogran  Prisma Matrix, 4.34 (sq in) (silver collagen) Discharge Instruction: Moisten collagen with saline or hydrogel Secondary Dressing ABD Pad, 5x9 Discharge Instruction: In clinic. Home Health to apply wound vac. Woven Gauze Sponge, Non-Sterile 4x4 in Discharge Instruction: In clinic. Home Health to apply wound vac. Secured With [employer], 4.5x3.1 (in/yd) Discharge Instruction: In clinic. Home Health to apply wound vac. 78M Medipore H Soft Cloth Surgical T ape, 4 x 10 (in/yd) Discharge Instruction: In clinic. Home Health to apply wound vac. Compression Wrap Compression Stockings Add-Ons Electronic Signature(s) Signed: 12/10/2021 2:36:56 PM By: Sandre Kitty Signed: 12/10/2021 5:03:19 PM By: [name] By: Sandre Kitty on 12/10/2021 11:50:51 -------------------------------------------------------------------------------- Vitals Details Patient Name: Date of Service: NA Colin [name] 12/10/2021 12:45 PM Medical Record Number: [id number] Patient Account Number: [id number] Date of Birth/Sex: Treating RN: 08/02/1955 (66 y.o. [name] Primary Care Jaxsun Ciampi: [name] Other Clinician: Referring Benjimen Kelley: Treating Perl Folmar/Extender: [name] Weeks in Treatment: 15 Vital Signs Time Taken: 11:41 Temperature (F): 97.7 Height (in): 74 Pulse (bpm): 72 Weight (lbs): 186 Respiratory Rate (  breaths/min): 16 Body Mass Index (BMI): 23.9 Blood Pressure (mmHg): 123/95 Reference Range: 80 - 120 mg / dl Electronic Signature(s) Signed: 12/10/2021 2:36:56 PM By: Sandre Kitty Entered By: Sandre Kitty on 12/10/2021 11:41:19

## 2021-12-11 DIAGNOSIS — T8131XA Disruption of external operation (surgical) wound, not elsewhere classified, initial encounter: Secondary | ICD-10-CM | POA: Diagnosis not present

## 2021-12-12 DIAGNOSIS — T8131XA Disruption of external operation (surgical) wound, not elsewhere classified, initial encounter: Secondary | ICD-10-CM | POA: Diagnosis not present

## 2021-12-13 ENCOUNTER — Encounter (HOSPITAL_BASED_OUTPATIENT_CLINIC_OR_DEPARTMENT_OTHER): Payer: BC Managed Care – PPO | Attending: General Surgery | Admitting: General Surgery

## 2021-12-13 DIAGNOSIS — M1990 Unspecified osteoarthritis, unspecified site: Secondary | ICD-10-CM | POA: Diagnosis not present

## 2021-12-13 DIAGNOSIS — K219 Gastro-esophageal reflux disease without esophagitis: Secondary | ICD-10-CM | POA: Diagnosis not present

## 2021-12-13 DIAGNOSIS — Z7982 Long term (current) use of aspirin: Secondary | ICD-10-CM | POA: Diagnosis not present

## 2021-12-13 DIAGNOSIS — I252 Old myocardial infarction: Secondary | ICD-10-CM | POA: Diagnosis not present

## 2021-12-13 DIAGNOSIS — L97324 Non-pressure chronic ulcer of left ankle with necrosis of bone: Secondary | ICD-10-CM | POA: Diagnosis not present

## 2021-12-13 DIAGNOSIS — E1140 Type 2 diabetes mellitus with diabetic neuropathy, unspecified: Secondary | ICD-10-CM | POA: Diagnosis not present

## 2021-12-13 DIAGNOSIS — T8131XA Disruption of external operation (surgical) wound, not elsewhere classified, initial encounter: Secondary | ICD-10-CM | POA: Diagnosis not present

## 2021-12-13 DIAGNOSIS — I10 Essential (primary) hypertension: Secondary | ICD-10-CM | POA: Diagnosis not present

## 2021-12-13 DIAGNOSIS — I2510 Atherosclerotic heart disease of native coronary artery without angina pectoris: Secondary | ICD-10-CM | POA: Diagnosis not present

## 2021-12-13 DIAGNOSIS — M86672 Other chronic osteomyelitis, left ankle and foot: Secondary | ICD-10-CM | POA: Insufficient documentation

## 2021-12-13 DIAGNOSIS — Z794 Long term (current) use of insulin: Secondary | ICD-10-CM | POA: Diagnosis not present

## 2021-12-13 DIAGNOSIS — D649 Anemia, unspecified: Secondary | ICD-10-CM | POA: Diagnosis not present

## 2021-12-13 DIAGNOSIS — L97322 Non-pressure chronic ulcer of left ankle with fat layer exposed: Secondary | ICD-10-CM | POA: Diagnosis not present

## 2021-12-13 DIAGNOSIS — E11621 Type 2 diabetes mellitus with foot ulcer: Secondary | ICD-10-CM | POA: Diagnosis not present

## 2021-12-13 DIAGNOSIS — Z951 Presence of aortocoronary bypass graft: Secondary | ICD-10-CM | POA: Diagnosis not present

## 2021-12-13 DIAGNOSIS — E11622 Type 2 diabetes mellitus with other skin ulcer: Secondary | ICD-10-CM | POA: Diagnosis not present

## 2021-12-13 DIAGNOSIS — E785 Hyperlipidemia, unspecified: Secondary | ICD-10-CM | POA: Diagnosis not present

## 2021-12-13 DIAGNOSIS — E11610 Type 2 diabetes mellitus with diabetic neuropathic arthropathy: Secondary | ICD-10-CM | POA: Insufficient documentation

## 2021-12-13 DIAGNOSIS — Z7984 Long term (current) use of oral hypoglycemic drugs: Secondary | ICD-10-CM | POA: Diagnosis not present

## 2021-12-13 DIAGNOSIS — Z9181 History of falling: Secondary | ICD-10-CM | POA: Diagnosis not present

## 2021-12-13 LAB — GLUCOSE, CAPILLARY
Glucose-Capillary: 201 mg/dL — ABNORMAL HIGH (ref 70–99)
Glucose-Capillary: 265 mg/dL — ABNORMAL HIGH (ref 70–99)

## 2021-12-13 NOTE — Progress Notes (Signed)
SHANTI, EICHEL ([id number]) Visit Report for 12/13/2021 Problem List Details Patient Name: Date of Service: NA [name] 12/13/2021 10:00 A M Medical Record Number: [id number] Patient Account Number: [id number] Date of Birth/Sex: Treating RN: Sep 08, 1955 (66 y.o. 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 12/13/2021 Medical Record Number: [id number] Patient Account Number: [id number] Date of Birth/Sex: Treating RN: November 25, 1955 (66 y.o. 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 Facility Procedures CPT4 Code: [id number] Description: G0277-(Facility Use Only) HBOT full body chamber, , ICD-10 Diagnosis Description [phone] Other chronic osteomyelitis, left ankle and foot 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 E11.621 Type 2 diabetes  mellitus with foot ulcer I25.10 Atherosclerotic heart disease of native coronary artery without angina pectoris Modifier: Quantity: 4 Physician Procedures : CPT4 Code Description Modifier [id number] - WC PHYS HYPERBARIC OXYGEN THERAPY ICD-10 Diagnosis Description M86.672 Other chronic osteomyelitis, left ankle and foot 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

## 2021-12-13 NOTE — Progress Notes (Addendum)
SAL, SPRATLEY ([id number]) Visit Report for 12/13/2021 Arrival Information Details Patient Name: Date of Service: NA [name] 12/13/2021 10:00 A M Medical Record Number: [id number] Patient Account Number: [id number] Date of Birth/Sex: Treating RN: 1955/08/29 (66 y.o. [name] Primary Care Markala Sitts: [name] Other Clinician: [name] Referring Aryonna Gunnerson: Treating Alf Doyle/Extender: [name] in Treatment: 15 Visit Information History Since Last Visit All ordered tests and consults were completed: Yes Patient Arrived: Knee Scooter Added or deleted any medications: No Arrival Time: 09:12 Any new allergies or adverse reactions: No Accompanied By: Wife Had a fall or experienced change in No Transfer Assistance: None activities of daily living that may affect Patient Identification Verified: Yes risk of falls: Secondary Verification Process Completed: Yes Signs or symptoms of abuse/neglect since last visito No Patient Requires Transmission-Based Precautions: No Hospitalized since last visit: No Patient Has Alerts: Yes Implantable device outside of the clinic excluding No Patient Alerts: Patient on Blood Thinner cellular tissue based products placed in the center since last visit: Pain Present Now: No Electronic Signature(s) Signed: 12/13/2021 2:02:59 PM By: [name] EMT Entered By: [name] on 12/13/2021 14:02:59 -------------------------------------------------------------------------------- Encounter Discharge Information Details Patient Name: Date of Service: NA [name] 12/13/2021 10:00 A M Medical Record Number: [id number] Patient Account Number: [id number] Date of Birth/Sex: Treating RN: Jun 10, 1956 (66 y.o. [name] Primary Care Edu On: [name] Other Clinician: [name] Referring Norah Fick: Treating Alaycia Eardley/Extender: [name]: 15 Encounter  Discharge Information Items Discharge Condition: Stable Ambulatory Status: Knee Scooter Discharge Destination: Home Transportation: Private Auto Accompanied By: Wife Schedule Follow-up Appointment: Yes Clinical Summary of Care: Electronic Signature(s) Signed: 12/13/2021 2:08:53 PM By: [name] EMT Entered By: [name] on 12/13/2021 14:08:52 -------------------------------------------------------------------------------- Vitals Details Patient Name: Date of Service: NA [name] 12/13/2021 10:00 A M Medical Record Number: [id number] Patient Account Number: [id number] Date of Birth/Sex: Treating RN: 01-10-1956 (66 y.o. [name] Primary Care Jocee Kissick: [name] Other Clinician: [name] Referring Sandie Swayze: Treating Melven Stockard/Extender: [name]: 15 Vital Signs Time Taken: 09:27 Temperature (F): 97.2 Height (in): 74 Pulse (bpm): 77 Weight (lbs): 186 Respiratory Rate (breaths/min): 18 Body Mass Index (BMI): 23.9 Blood Pressure (mmHg): 119/80 Capillary Blood Glucose (mg/dl): 237 Reference Range: 80 - 120 mg / dl Electronic Signature(s) Signed: 12/13/2021 2:04:40 PM By: [name] EMT Entered By: [name] on 12/13/2021 14:04:40

## 2021-12-13 NOTE — Progress Notes (Addendum)
DVONTE, GATLIFF ([id number]) Visit Report for 12/13/2021 HBO Details Patient Name: Date of Service: NA [name] 12/13/2021 10:00 A M Medical Record Number: [id number] Patient Account Number: [id number] Date of Birth/Sex: Treating RN: 14-May-1956 (66 y.o. [name] Primary Care Erla Bacchi: [name] Other Clinician: [name] Referring Leonor Darnell: Treating Raylynn Hersh/Extender: [name]: 15 HBO Treatment Course Details Treatment Course Number: 1 Ordering Rissa Turley: [name] T Treatments Ordered: otal 80 HBO Treatment Start Date: 09/20/2021 HBO Indication: Diabetic Ulcer(s) of the Lower Extremity Standard/Conservative Wound Care tried and failed greater than or equal to 30 days HBO Treatment Details Treatment Number: 59 Patient Type: Outpatient Chamber Type: Monoplace Chamber Serial #: [id number] Treatment Protocol: 2.0 ATA with 90 minutes oxygen, and no air breaks Treatment Details Compression Rate Down: 2.0 psi / minute De-Compression Rate Up: 2.0 psi / minute A breaks and breathing ir Compress Tx Pressure periods Decompress Decompress Begins Reached (leave unused spaces Begins Ends blank) Chamber Pressure (ATA 1 2 - - - - --2 1 ) Clock Time (24 hr) 09:33 09:47 10:17 10:22 10:52 10:57 - - 11:27 11:40 Treatment Length: 127 (minutes) Treatment Segments: 4 Vital Signs Capillary Blood Glucose Reference Range: 80 - 120 mg / dl HBO Diabetic Blood Glucose Intervention Range: <131 mg/dl or >025 mg/dl Time Vitals Blood Respiratory Capillary Blood Glucose Pulse Action Type: Pulse: Temperature: Taken: Pressure: Rate: Glucose (mg/dl): Meter #: Oximetry (%) Taken: Pre 09:27 119/80 77 18 97.2 201 Post 11:47 161/84 66 16 97.2 265 Treatment Response Treatment Toleration: Well Treatment Completion Status: Treatment Completed without Adverse Event Additional Procedure Documentation Tissue Sevierity: Necrosis of  bone Physician HBO Attestation: I certify that I supervised this HBO treatment in accordance with Medicare guidelines. A trained emergency response team is readily available per Yes hospital policies and procedures. Continue HBOT as ordered. Yes Electronic Signature(s) Signed: 12/13/2021 2:54:52 PM By: [name] MD FACS Previous Signature: 12/13/2021 2:07:41 PM Version By: [name] EMT Entered By: [name] on 12/13/2021 14:54:51 -------------------------------------------------------------------------------- HBO Safety Checklist Details Patient Name: Date of Service: Colin Fanning A. 12/13/2021 10:00 A M Medical Record Number: [id number] Patient Account Number: [id number] Date of Birth/Sex: Treating RN: [birth year] (66 y.o. [name] Primary Care Marcellus Pulliam: [name] Other Clinician: [name] Referring Kamdyn Colborn: Treating Vasilisa Vore/Extender: [name]: 15 HBO Safety Checklist Items Safety Checklist Consent Form Signed Patient voided / foley secured and emptied When did you last eato 0715 Last dose of injectable or oral agent 0715 Ostomy pouch emptied and vented if applicable NA All implantable devices assessed, documented and approved NA Intravenous access site secured and place NA Valuables secured Linens and cotton and cotton/polyester blend (less than 51% polyester) Personal oil-based products / skin lotions / body lotions removed Wigs or hairpieces removed NA Smoking or tobacco materials removed Books / newspapers / magazines / loose paper removed Cologne, aftershave, perfume and deodorant removed Jewelry removed (may wrap wedding band) NA Make-up removed NA Hair care products removed NA Battery operated devices (external) removed Heating patches and chemical warmers removed Titanium eyewear removed Nail polish cured greater than 10 hours NA Casting material cured greater than 10  hours NA Hearing aids removed NA Loose dentures or partials removed NA Prosthetics have been removed NA Patient demonstrates correct use of air break device (if applicable) Patient concerns have been addressed Patient grounding bracelet on and cord attached to chamber Specifics for Inpatients (complete in addition  to above) Medication sheet sent with patient NA Intravenous medications needed or due during therapy sent with patient NA Drainage tubes (e.g. nasogastric tube or chest tube secured and vented) NA Endotracheal or Tracheotomy tube secured NA Cuff deflated of air and inflated with saline NA Airway suctioned Notes The safety checklist was done before treatment started Electronic Signature(s) Signed: 12/13/2021 2:06:00 PM By: [name] EMT Entered By: [name] on 12/13/2021 14:06:00

## 2021-12-14 DIAGNOSIS — T8131XA Disruption of external operation (surgical) wound, not elsewhere classified, initial encounter: Secondary | ICD-10-CM | POA: Diagnosis not present

## 2021-12-15 ENCOUNTER — Encounter (HOSPITAL_BASED_OUTPATIENT_CLINIC_OR_DEPARTMENT_OTHER): Payer: BC Managed Care – PPO | Admitting: General Surgery

## 2021-12-15 DIAGNOSIS — Z7984 Long term (current) use of oral hypoglycemic drugs: Secondary | ICD-10-CM | POA: Diagnosis not present

## 2021-12-15 DIAGNOSIS — M86672 Other chronic osteomyelitis, left ankle and foot: Secondary | ICD-10-CM | POA: Diagnosis not present

## 2021-12-15 DIAGNOSIS — I2510 Atherosclerotic heart disease of native coronary artery without angina pectoris: Secondary | ICD-10-CM | POA: Diagnosis not present

## 2021-12-15 DIAGNOSIS — L97324 Non-pressure chronic ulcer of left ankle with necrosis of bone: Secondary | ICD-10-CM | POA: Diagnosis not present

## 2021-12-15 DIAGNOSIS — I252 Old myocardial infarction: Secondary | ICD-10-CM | POA: Diagnosis not present

## 2021-12-15 DIAGNOSIS — M1990 Unspecified osteoarthritis, unspecified site: Secondary | ICD-10-CM | POA: Diagnosis not present

## 2021-12-15 DIAGNOSIS — E11610 Type 2 diabetes mellitus with diabetic neuropathic arthropathy: Secondary | ICD-10-CM | POA: Diagnosis not present

## 2021-12-15 DIAGNOSIS — E785 Hyperlipidemia, unspecified: Secondary | ICD-10-CM | POA: Diagnosis not present

## 2021-12-15 DIAGNOSIS — Z9181 History of falling: Secondary | ICD-10-CM | POA: Diagnosis not present

## 2021-12-15 DIAGNOSIS — T8131XA Disruption of external operation (surgical) wound, not elsewhere classified, initial encounter: Secondary | ICD-10-CM | POA: Diagnosis not present

## 2021-12-15 DIAGNOSIS — K219 Gastro-esophageal reflux disease without esophagitis: Secondary | ICD-10-CM | POA: Diagnosis not present

## 2021-12-15 DIAGNOSIS — D649 Anemia, unspecified: Secondary | ICD-10-CM | POA: Diagnosis not present

## 2021-12-15 DIAGNOSIS — E11621 Type 2 diabetes mellitus with foot ulcer: Secondary | ICD-10-CM | POA: Diagnosis not present

## 2021-12-15 DIAGNOSIS — Z794 Long term (current) use of insulin: Secondary | ICD-10-CM | POA: Diagnosis not present

## 2021-12-15 DIAGNOSIS — E1140 Type 2 diabetes mellitus with diabetic neuropathy, unspecified: Secondary | ICD-10-CM | POA: Diagnosis not present

## 2021-12-15 DIAGNOSIS — L97322 Non-pressure chronic ulcer of left ankle with fat layer exposed: Secondary | ICD-10-CM | POA: Diagnosis not present

## 2021-12-15 DIAGNOSIS — Z951 Presence of aortocoronary bypass graft: Secondary | ICD-10-CM | POA: Diagnosis not present

## 2021-12-15 DIAGNOSIS — E11622 Type 2 diabetes mellitus with other skin ulcer: Secondary | ICD-10-CM | POA: Diagnosis not present

## 2021-12-15 DIAGNOSIS — Z7982 Long term (current) use of aspirin: Secondary | ICD-10-CM | POA: Diagnosis not present

## 2021-12-15 DIAGNOSIS — I10 Essential (primary) hypertension: Secondary | ICD-10-CM | POA: Diagnosis not present

## 2021-12-15 LAB — GLUCOSE, CAPILLARY
Glucose-Capillary: 173 mg/dL — ABNORMAL HIGH (ref 70–99)
Glucose-Capillary: 143 mg/dL — ABNORMAL HIGH (ref 70–99)

## 2021-12-16 ENCOUNTER — Encounter: Payer: BC Managed Care – PPO | Admitting: Vascular Surgery

## 2021-12-16 ENCOUNTER — Encounter (HOSPITAL_COMMUNITY): Payer: BC Managed Care – PPO

## 2021-12-16 ENCOUNTER — Encounter (HOSPITAL_BASED_OUTPATIENT_CLINIC_OR_DEPARTMENT_OTHER): Payer: BC Managed Care – PPO | Admitting: General Surgery

## 2021-12-16 DIAGNOSIS — E11610 Type 2 diabetes mellitus with diabetic neuropathic arthropathy: Secondary | ICD-10-CM | POA: Diagnosis not present

## 2021-12-16 DIAGNOSIS — E11621 Type 2 diabetes mellitus with foot ulcer: Secondary | ICD-10-CM | POA: Diagnosis not present

## 2021-12-16 DIAGNOSIS — T8131XA Disruption of external operation (surgical) wound, not elsewhere classified, initial encounter: Secondary | ICD-10-CM | POA: Diagnosis not present

## 2021-12-16 DIAGNOSIS — L97324 Non-pressure chronic ulcer of left ankle with necrosis of bone: Secondary | ICD-10-CM | POA: Diagnosis not present

## 2021-12-16 DIAGNOSIS — I2510 Atherosclerotic heart disease of native coronary artery without angina pectoris: Secondary | ICD-10-CM | POA: Diagnosis not present

## 2021-12-16 DIAGNOSIS — M86672 Other chronic osteomyelitis, left ankle and foot: Secondary | ICD-10-CM | POA: Diagnosis not present

## 2021-12-16 LAB — GLUCOSE, CAPILLARY
Glucose-Capillary: 219 mg/dL — ABNORMAL HIGH (ref 70–99)
Glucose-Capillary: 255 mg/dL — ABNORMAL HIGH (ref 70–99)

## 2021-12-16 NOTE — Progress Notes (Signed)
DAEMION, MCNIEL ([id number]) Visit Report for 12/15/2021 Arrival Information Details Patient Name: Date of Service: NA [name] 12/15/2021 9:00 A M Medical Record Number: [id number] Patient Account Number: [id number] Date of Birth/Sex: Treating RN: October 08, 1955 (66 y.o. [name] Primary Care Fabrice Dyal: [name] Other Clinician: [name] Referring Nikyla Navedo: Treating Broghan Pannone/Extender: [name] in Treatment: 16 Visit Information History Since Last Visit All ordered tests and consults were completed: Yes Patient Arrived: Knee Scooter Added or deleted any medications: No Arrival Time: 09:11 Any new allergies or adverse reactions: No Accompanied By: self Had a fall or experienced change in No Transfer Assistance: None activities of daily living that may affect Patient Identification Verified: Yes risk of falls: Secondary Verification Process Completed: Yes Signs or symptoms of abuse/neglect since last visito No Patient Requires Transmission-Based Precautions: No Hospitalized since last visit: No Patient Has Alerts: Yes Implantable device outside of the clinic excluding No Patient Alerts: Patient on Blood Thinner cellular tissue based products placed in the center since last visit: Pain Present Now: No Electronic Signature(s) Signed: 12/16/2021 1:41:31 PM By: [name] CHT EMT BS , , Entered By: [name] on 12/15/2021 11:55:44 -------------------------------------------------------------------------------- Encounter Discharge Information Details Patient Name: Date of Service: Colin Fanning A. 12/15/2021 9:00 A M Medical Record Number: [id number] Patient Account Number: [id number] Date of Birth/Sex: Treating RN: 01-05-1956 (66 y.o. [name] Primary Care Faelynn Wynder: [name] Other Clinician: [name] Referring Daniyah Fohl: Treating Shandel Busic/Extender: [name] in Treatment:  16 Encounter Discharge Information Items Discharge Condition: Stable Ambulatory Status: Knee Scooter Discharge Destination: Home Transportation: Private Auto Accompanied By: self Schedule Follow-up Appointment: No Clinical Summary of Care: Electronic Signature(s) Signed: 12/16/2021 1:41:31 PM By: [name] CHT EMT BS , , Entered By: [name] on 12/15/2021 11:55:29 -------------------------------------------------------------------------------- Vitals Details Patient Name: Date of Service: NA Colin Nephew A. 12/15/2021 9:00 A M Medical Record Number: [id number] Patient Account Number: [id number] Date of Birth/Sex: Treating RN: 1955-07-23 (66 y.o. [name] Primary Care Alec Jaros: [name] Other Clinician: [name] Referring Traeson Dusza: Treating Jairo Bellew/Extender: [name]: 16 Vital Signs Time Taken: 09:23 Temperature (F): 97.8 Height (in): 74 Pulse (bpm): 73 Weight (lbs): 186 Respiratory Rate (breaths/min): 18 Body Mass Index (BMI): 23.9 Blood Pressure (mmHg): 112/91 Capillary Blood Glucose (mg/dl): 696 Reference Range: 80 - 120 mg / dl Electronic Signature(s) Signed: 12/16/2021 1:41:31 PM By: [name] CHT EMT BS , , Entered By: [name] on 12/15/2021 11:50:21

## 2021-12-16 NOTE — Progress Notes (Signed)
[name], [name] ([id number]) Visit Report for 12/16/2021 HBO Details Patient Name: Date of Service: NA [name] 12/16/2021 10:00 A M Medical Record Number: [id number] Patient Account Number: [id number] Date of Birth/Sex: Treating RN: December 18, 1955 (66 y.o. [name] Primary Care Jaylynn Siefert: [name] Other Clinician: [name] Referring Braelon Sprung: Treating Kaylia Winborne/Extender: [name]: 16 HBO Treatment Course Details Treatment Course Number: 1 Ordering Olsen Mccutchan: [name] T Treatments Ordered: otal 80 HBO Treatment Start Date: 09/20/2021 HBO Indication: Diabetic Ulcer(s) of the Lower Extremity Standard/Conservative Wound Care tried and failed greater than or equal to 30 days HBO Treatment Details Treatment Number: 61 Patient Type: Outpatient Chamber Type: Monoplace Chamber Serial #: [id number] Treatment Protocol: 2.0 ATA with 90 minutes oxygen, and no air breaks Treatment Details Compression Rate Down: 2.0 psi / minute De-Compression Rate Up: 2.0 psi / minute A breaks and breathing ir Compress Tx Pressure periods Decompress Decompress Begins Reached (leave unused spaces Begins Ends blank) Chamber Pressure (ATA 1 2 - - - - --2 1 ) Clock Time (24 hr) 09:42 09:53 10:23 10:28 10:58 11:03 - - 11:33 11:44 Treatment Length: 122 (minutes) Treatment Segments: 4 Vital Signs Capillary Blood Glucose Reference Range: 80 - 120 mg / dl HBO Diabetic Blood Glucose Intervention Range: <131 mg/dl or >151 mg/dl Type: Time Vitals Blood Pulse: Respiratory Temperature: Capillary Blood Glucose Pulse Action Taken: Pressure: Rate: Glucose (mg/dl): Meter #: Oximetry (%) Taken: Pre 09:37 134/74 79 16 98.1 219 none per protocol Post 11:49 159/92 72 16 98.1 255 informed physician of blood glucose level. Treatment Response Treatment Toleration: Well Treatment Completion Status: Treatment Completed without Adverse Event Additional Procedure  Documentation Tissue Sevierity: Necrosis of bone Physician HBO Attestation: I certify that I supervised this HBO treatment in accordance with Medicare guidelines. A trained emergency response team is readily available per Yes hospital policies and procedures. Continue HBOT as ordered. Yes Electronic Signature(s) Signed: 12/16/2021 3:24:22 PM By: [name] MD FACS Previous Signature: 12/16/2021 1:41:31 PM Version By: [name] CHT EMT BS , , Entered By: [name] on 12/16/2021 15:24:21 -------------------------------------------------------------------------------- HBO Safety Checklist Details Patient Name: Date of Service: [name] A. 12/16/2021 10:00 A M Medical Record Number: [id number] Patient Account Number: [id number] Date of Birth/Sex: Treating RN: 1955/10/28 (66 y.o. [name] Primary Care Zakariyah Freimark: [name] Other Clinician: [name] Referring Prisha Hiley: Treating Davontay Watlington/Extender: [name]: 16 HBO Safety Checklist Items Safety Checklist Consent Form Signed Patient voided / foley secured and emptied When did you last eato 0700 Last dose of injectable or oral agent 0700 Ostomy pouch emptied and vented if applicable NA All implantable devices assessed, documented and approved NA Intravenous access site secured and place NA Valuables secured Linens and cotton and cotton/polyester blend (less than 51% polyester) Personal oil-based products / skin lotions / body lotions removed Wigs or hairpieces removed NA Smoking or tobacco materials removed NA Books / newspapers / magazines / loose paper removed Cologne, aftershave, perfume and deodorant removed Jewelry removed (may wrap wedding band) Make-up removed NA Hair care products removed Battery operated devices (external) removed Heating patches and chemical warmers removed Titanium eyewear removed Nail polish cured greater than 10  hours NA Casting material cured greater than 10 hours NA Hearing aids removed NA Loose dentures or partials removed NA Prosthetics have been removed NA Patient demonstrates correct use of air break device (if applicable) Patient concerns have been addressed Patient grounding bracelet  on and cord attached to chamber Specifics for Inpatients (complete in addition to above) Medication sheet sent with patient NA Intravenous medications needed or due during therapy sent with patient NA Drainage tubes (e.g. nasogastric tube or chest tube secured and vented) NA Endotracheal or Tracheotomy tube secured NA Cuff deflated of air and inflated with saline NA Airway suctioned NA Notes Paper version used prior to treatment. Electronic Signature(s) Signed: 12/16/2021 1:41:31 PM By: [name] CHT EMT BS , , Entered By: [name] on 12/16/2021 13:13:38

## 2021-12-16 NOTE — Progress Notes (Signed)
[name], [name] ([id number]) Visit Report for 12/15/2021 HBO Details Patient Name: Date of Service: NA [name] 12/15/2021 9:00 A M Medical Record Number: [id number] Patient Account Number: [id number] Date of Birth/Sex: Treating RN: 19-Jan-1956 (66 y.o. [name] Primary Care Alan Riles: [name] Other Clinician: [name] Referring Elzada Pytel: Treating Brendt Dible/Extender: [name]: 16 HBO Treatment Course Details Treatment Course Number: 1 Ordering Takiah Maiden: [name] T Treatments Ordered: otal 80 HBO Treatment Start Date: 09/20/2021 HBO Indication: Diabetic Ulcer(s) of the Lower Extremity Standard/Conservative Wound Care tried and failed greater than or equal to 30 days HBO Treatment Details Treatment Number: 60 Patient Type: Outpatient Chamber Type: Monoplace Chamber Serial #: [id number] Treatment Protocol: 2.0 ATA with 90 minutes oxygen, and no air breaks Treatment Details Compression Rate Down: 2.0 psi / minute De-Compression Rate Up: 2.0 psi / minute A breaks and breathing ir Compress Tx Pressure periods Decompress Decompress Begins Reached (leave unused spaces Begins Ends blank) Chamber Pressure (ATA 1 2 - - - - --2 1 ) Clock Time (24 hr) 09:29 09:41 10:11 10:16 10:46 10:51 - - 11:21 11:30 Treatment Length: 121 (minutes) Treatment Segments: 4 Vital Signs Capillary Blood Glucose Reference Range: 80 - 120 mg / dl HBO Diabetic Blood Glucose Intervention Range: <131 mg/dl or >629 mg/dl Time Vitals Blood Respiratory Capillary Blood Glucose Pulse Action Type: Pulse: Temperature: Taken: Pressure: Rate: Glucose (mg/dl): Meter #: Oximetry (%) Taken: Pre 09:23 112/91 73 18 97.8 173 Post 11:33 156/80 66 18 97.9 143 Treatment Response Treatment Toleration: Well Treatment Completion Status: Treatment Completed without Adverse Event Additional Procedure Documentation Tissue Sevierity: Necrosis of  bone Physician HBO Attestation: I certify that I supervised this HBO treatment in accordance with Medicare guidelines. A trained emergency response team is readily available per Yes hospital policies and procedures. Continue HBOT as ordered. Yes Electronic Signature(s) Signed: 12/15/2021 2:55:36 PM By: [name] MD FACS Entered By: [name] on 12/15/2021 14:55:35 -------------------------------------------------------------------------------- HBO Safety Checklist Details Patient Name: Date of Service: NA Colin Nephew A. 12/15/2021 9:00 A M Medical Record Number: [id number] Patient Account Number: [id number] Date of Birth/Sex: Treating RN: [birth year] (66 y.o. [name] Primary Care Carlo Guevarra: [name] Other Clinician: [name] Referring [name]: Treating Jacobi Nile/Extender: [name]: 16 HBO Safety Checklist Items Safety Checklist Consent Form Signed Patient voided / foley secured and emptied When did you last eato 0645 Last dose of injectable or oral agent 0645 Ostomy pouch emptied and vented if applicable NA All implantable devices assessed, documented and approved NA Intravenous access site secured and place NA Valuables secured Linens and cotton and cotton/polyester blend (less than 51% polyester) Personal oil-based products / skin lotions / body lotions removed Wigs or hairpieces removed NA Smoking or tobacco materials removed NA Books / newspapers / magazines / loose paper removed Cologne, aftershave, perfume and deodorant removed Jewelry removed (may wrap wedding band) Make-up removed NA Hair care products removed Battery operated devices (external) removed Heating patches and chemical warmers removed Titanium eyewear removed NA Nail polish cured greater than 10 hours NA Casting material cured greater than 10 hours NA Hearing aids removed NA Loose dentures or partials  removed NA Prosthetics have been removed NA Patient demonstrates correct use of air break device (if applicable) Patient concerns have been addressed Patient grounding bracelet on and cord attached to chamber Specifics for Inpatients (complete in addition to above) Medication sheet sent with patient NA Intravenous medications  needed or due during therapy sent with patient NA Drainage tubes (e.g. nasogastric tube or chest tube secured and vented) NA Endotracheal or Tracheotomy tube secured NA Cuff deflated of air and inflated with saline NA Airway suctioned NA Notes Paper version used prior to treatment. Electronic Signature(s) Signed: 12/16/2021 1:41:31 PM By: [name] CHT EMT BS , , Entered By: [name] on 12/15/2021 11:52:35

## 2021-12-16 NOTE — Progress Notes (Signed)
CAVAN, BEARDEN ([id number]) Visit Report for 12/16/2021 Arrival Information Details Patient Name: Date of Service: NA [name] 12/16/2021 12:45 PM Medical Record Number: [id number] Patient Account Number: [id number] Date of Birth/Sex: Treating RN: May 19, 1956 (67 y.o. [name] Primary Care Magon Croson: [name] Other Clinician: Referring Jolisa Intriago: Treating Jaylynn Siefert/Extender: [name] in Treatment: 16 Visit Information History Since Last Visit Added or deleted any medications: No Patient Arrived: Knee Scooter Any new allergies or adverse reactions: No Arrival Time: 12:34 Had a fall or experienced change in No Accompanied By: self activities of daily living that may affect Transfer Assistance: None risk of falls: Patient Identification Verified: Yes Signs or symptoms of abuse/neglect since last visito No Secondary Verification Process Completed: Yes Hospitalized since last visit: No Patient Requires Transmission-Based Precautions: No Implantable device outside of the clinic excluding No Patient Has Alerts: Yes cellular tissue based products placed in the center Patient Alerts: Patient on Blood Thinner since last visit: Has Dressing in Place as Prescribed: Yes Pain Present Now: No Electronic Signature(s) Signed: 12/16/2021 4:34:44 PM By: Adline Peals Entered By: Adline Peals on 12/16/2021 12:51:03 -------------------------------------------------------------------------------- Encounter Discharge Information Details Patient Name: Date of Service: [name] 12/16/2021 12:45 PM Medical Record Number: [id number] Patient Account Number: [id number] Date of Birth/Sex: Treating RN: 1956/03/22 (66 y.o. [name] Primary Care Rayah Fines: [name] Other Clinician: Referring Gor Vestal: Treating Aalani Aikens/Extender: [name] in Treatment: 16 Encounter Discharge Information Items Post  Procedure Vitals Discharge Condition: Stable Temperature (F): 97.4 Ambulatory Status: Knee Scooter Pulse (bpm): 76 Discharge Destination: Home Respiratory Rate (breaths/min): 18 Transportation: Private Auto Blood Pressure (mmHg): 169/95 Accompanied By: self Schedule Follow-up Appointment: Yes Clinical Summary of Care: Patient Declined Electronic Signature(s) Signed: 12/16/2021 4:34:44 PM By: Adline Peals Entered By: Adline Peals on 12/16/2021 13:19:36 -------------------------------------------------------------------------------- Lower Extremity Assessment Details Patient Name: Date of Service: Colin Loveless A. 12/16/2021 12:45 PM Medical Record Number: [id number] Patient Account Number: [id number] Date of Birth/Sex: Treating RN: 02/11/1956 (66 y.o. [name] Primary Care Veneta Sliter: [name] Other Clinician: Referring Tiara Bartoli: Treating Meilyn Heindl/Extender: [name] Weeks in Treatment: 16 Edema Assessment Assessed: Shirlyn Goltz: No] Patrice Paradise: No] Edema: [Left: Ye] [Right: s] Calf Left: Right: Point of Measurement: From Medial Instep 35 cm Ankle Left: Right: Point of Measurement: From Medial Instep 24.1 cm Vascular Assessment Pulses: Dorsalis Pedis Palpable: [Left:Yes] Electronic Signature(s) Signed: 12/16/2021 4:34:44 PM By: Adline Peals Entered By: Adline Peals on 12/16/2021 12:55:27 -------------------------------------------------------------------------------- Multi Wound Chart Details Patient Name: Date of Service: [name] 12/16/2021 12:45 PM Medical Record Number: [id number] Patient Account Number: [id number] Date of Birth/Sex: Treating RN: Feb 13, 1956 (66 y.o. [name] Primary Care Milca Sytsma: [name] Other Clinician: Referring Ennis Heavner: Treating Roxas Clymer/Extender: [name] Weeks in Treatment: 16 Vital Signs Height(in): 74 Capillary Blood Glucose(mg/dl):  204 Weight(lbs): 186 Pulse(bpm): 78 Body Mass Index(BMI): 23.9 Blood Pressure(mmHg): 169/95 Temperature(F): 97.4 Respiratory Rate(breaths/min): 18 Photos: [1:Left, Medial Foot] [N/A:N/A N/A] Wound Location: [1:Gradually Appeared] [N/A:N/A] Wounding Event: [1:Diabetic Wound/Ulcer of the Lower] [N/A:N/A] Primary Etiology: [1:Extremity Cataracts, Coronary Artery Disease, N/A] Comorbid History: [1:Hypertension, Myocardial Infarction, Peripheral Arterial Disease, Type II Diabetes, Osteomyelitis, Neuropathy 02/22/2021] [N/A:N/A] Date Acquired: [1:16] [N/A:N/A] Weeks of Treatment: [1:Open] [N/A:N/A] Wound Status: [1:No] [N/A:N/A] Wound Recurrence: [1:5.9x2.7x0.2] [N/A:N/A] Measurements L x W x D (cm) [1:12.511] [N/A:N/A] A (cm) : rea [1:2.502] [N/A:N/A] Volume (cm) : [1:11.50%] [N/A:N/A] % Reduction in A [1:rea: 82.30%] [N/A:N/A] %  Reduction in Volume: [1:12] Position 1 (o'clock): [1:3] Maximum Distance 1 (cm): [1:Yes] [N/A:N/A] Tunneling: [1:Grade 3] [N/A:N/A] Classification: [1:Medium] [N/A:N/A] Exudate A mount: [1:Serosanguineous] [N/A:N/A] Exudate Type: [1:red, brown] [N/A:N/A] Exudate Color: [1:Distinct, outline attached] [N/A:N/A] Wound Margin: [1:Large (67-100%)] [N/A:N/A] Granulation A mount: [1:Red] [N/A:N/A] Granulation Quality: [1:Small (1-33%)] [N/A:N/A] Necrotic A mount: [1:Fat Layer (Subcutaneous Tissue): Yes N/A] Exposed Structures: [1:Bone: Yes Fascia: No Tendon: No Muscle: No Joint: No Small (1-33%)] [N/A:N/A] Epithelialization: [1:Debridement - Excisional] [N/A:N/A] Debridement: Pre-procedure Verification/Time Out 13:05 [N/A:N/A] Taken: [1:Lidocaine 5% topical ointment] [N/A:N/A] Pain Control: [1:Subcutaneous, Slough] [N/A:N/A] Tissue Debrided: [1:Skin/Subcutaneous Tissue] [N/A:N/A] Level: [1:15.93] [N/A:N/A] Debridement A (sq cm): [1:rea Curette] [N/A:N/A] Instrument: [1:Minimum] [N/A:N/A] Bleeding: [1:Pressure] [N/A:N/A] Hemostasis A chieved: [1:0]  [N/A:N/A] Procedural Pain: [1:0] [N/A:N/A] Post Procedural Pain: [1:Procedure was tolerated well] [N/A:N/A] Debridement Treatment Response: [1:5.9x2.7x0.2] [N/A:N/A] Post Debridement Measurements L x W x D (cm) [1:2.502] [N/A:N/A] Post Debridement Volume: (cm) [1:Debridement] [N/A:N/A] Treatment Notes Electronic Signature(s) Signed: 12/16/2021 1:15:39 PM By: [name] MD FACS Signed: 12/16/2021 4:34:44 PM By: Adline Peals Entered By: [name] on 12/16/2021 13:15:39 -------------------------------------------------------------------------------- Multi-Disciplinary Care Plan Details Patient Name: Date of Service: [name] 12/16/2021 12:45 PM Medical Record Number: [id number] Patient Account Number: [id number] Date of Birth/Sex: Treating RN: [birth year] (66 y.o. [name] Primary Care Quinnlyn Hearns: [name] Other Clinician: Referring Danniel Grenz: Treating Lynix Bonine/Extender: [name] in Treatment: 16 Multidisciplinary Care Plan reviewed with physician Active Inactive HBO Nursing Diagnoses: Anxiety related to feelings of confinement associated with the hyperbaric oxygen chamber Anxiety related to knowledge deficit of hyperbaric oxygen therapy and treatment procedures Discomfort related to temperature and humidity changes inside hyperbaric chamber Potential for barotraumas to ears, sinuses, teeth, and lungs or cerebral gas embolism related to changes in atmospheric pressure inside hyperbaric oxygen chamber Potential for oxygen toxicity seizures related to delivery of 100% oxygen at an increased atmospheric pressure Potential for pulmonary oxygen toxicity related to delivery of 100% oxygen at an increased atmospheric pressure Goals: Barotrauma will be prevented during HBO2 Date Initiated: 09/17/2021 T arget Resolution Date: 01/07/2022 Goal Status: Active Patient will tolerate the hyperbaric oxygen therapy treatment Date  Initiated: 09/17/2021 T arget Resolution Date: 01/07/2022 Goal Status: Active Patient will tolerate the internal climate of the chamber Date Initiated: 09/17/2021 T arget Resolution Date: 01/07/2022 Goal Status: Active Patient/caregiver will verbalize understanding of HBO goals, rationale, procedures and potential hazards Date Initiated: 09/17/2021 T arget Resolution Date: 01/07/2022 Goal Status: Active Signs and symptoms of pulmonary oxygen toxicity will be recognized and promptly addressed Date Initiated: 09/17/2021 T arget Resolution Date: 01/07/2022 Goal Status: Active Signs and symptoms of seizure will be recognized and promptly addressed ; seizing patients will suffer no harm Date Initiated: 09/17/2021 T arget Resolution Date: 01/07/2022 Goal Status: Active Interventions: Administer decongestants, per physician orders, prior to HBO2 Administer the correct therapeutic gas delivery based on the patients needs and limitations, per physician order Assess and provide for patients comfort related to the hyperbaric environment and equalization of middle ear Assess for signs and symptoms related to adverse events, including but not limited to confinement anxiety, pneumothorax, oxygen toxicity and baurotrauma Assess patient for any history of confinement anxiety Assess patient's knowledge and expectations regarding hyperbaric medicine and provide education related to the hyperbaric environment, goals of treatment and prevention of adverse events Implement protocols to decrease risk of pneumothorax in high risk patients Notes: Nutrition Nursing Diagnoses: Impaired glucose control: actual or potential Goals: Patient/caregiver verbalizes understanding of need to maintain  therapeutic glucose control per primary care physician Date Initiated: 08/25/2021 Target Resolution Date: 01/07/2022 Goal Status: Active Interventions: Assess HgA1c results as ordered upon admission and as needed Provide education on  elevated blood sugars and impact on wound healing Notes: Osteomyelitis Nursing Diagnoses: Infection: osteomyelitis Knowledge deficit related to disease process and management Goals: Patient's osteomyelitis will resolve Date Initiated: 09/17/2021 Target Resolution Date: 01/07/2022 Goal Status: Active Interventions: Assess for signs and symptoms of osteomyelitis resolution every visit Provide education on osteomyelitis Treatment Activities: Surgical debridement : 09/17/2021 Systemic antibiotics : 09/17/2021 T ordered outside of clinic : 09/17/2021 est Notes: Wound/Skin Impairment Nursing Diagnoses: Impaired tissue integrity Goals: Patient/caregiver will verbalize understanding of skin care regimen Date Initiated: 08/25/2021 Target Resolution Date: 01/07/2022 Goal Status: Active Ulcer/skin breakdown will have a volume reduction of 30% by week 4 Date Initiated: 08/25/2021 Date Inactivated: 10/08/2021 Target Resolution Date: 10/20/2021 Goal Status: Met Interventions: Assess patient/caregiver ability to obtain necessary supplies Assess patient/caregiver ability to perform ulcer/skin care regimen upon admission and as needed Assess ulceration(s) every visit Provide education on ulcer and skin care Treatment Activities: Topical wound management initiated : 08/25/2021 Notes: Electronic Signature(s) Signed: 12/16/2021 4:34:44 PM By: Adline Peals Entered By: Adline Peals on 12/16/2021 12:58:31 -------------------------------------------------------------------------------- Pain Assessment Details Patient Name: Date of Service: Colin Loveless A. 12/16/2021 12:45 PM Medical Record Number: [id number] Patient Account Number: [id number] Date of Birth/Sex: Treating RN: 04/22/1956 (66 y.o. [name] Primary Care Tjay Velazquez: [name] Other Clinician: Referring Gunnar Hereford: Treating Antonie Borjon/Extender: [name] Weeks in Treatment: 16 Active  Problems Location of Pain Severity and Description of Pain Patient Has Paino No Site Locations Rate the pain. Current Pain Level: 0 Pain Management and Medication Current Pain Management: Electronic Signature(s) Signed: 12/16/2021 4:34:44 PM By: Adline Peals Entered By: Adline Peals on 12/16/2021 12:51:47 -------------------------------------------------------------------------------- Patient/Caregiver Education Details Patient Name: Date of Service: [name] 7/6/2023andnbsp12:45 PM Medical Record Number: [id number] Patient Account Number: [id number] Date of Birth/Gender: Treating RN: 1956/03/22 (66 y.o. [name] Primary Care Physician: [name] Other Clinician: Referring Physician: Treating Physician/Extender: [name] in Treatment: 16 Education Assessment Education Provided To: Patient Education Topics Provided Wound/Skin Impairment: Methods: Explain/Verbal Responses: Reinforcements needed, State content correctly Electronic Signature(s) Signed: 12/16/2021 4:34:44 PM By: Adline Peals Entered By: Adline Peals on 12/16/2021 12:58:43 -------------------------------------------------------------------------------- Wound Assessment Details Patient Name: Date of Service: Colin Loveless A. 12/16/2021 12:45 PM Medical Record Number: [id number] Patient Account Number: [id number] Date of Birth/Sex: Treating RN: [birth year] (66 y.o. [name] Primary Care Isiaih Hollenbach: [name] Other Clinician: Referring Lataya Varnell: Treating Logen Fowle/Extender: [name] Weeks in Treatment: 16 Wound Status Wound Number: 1 Primary Diabetic Wound/Ulcer of the Lower Extremity Etiology: Wound Location: Left, Medial Foot Wound Open Wounding Event: Gradually Appeared Status: Date Acquired: 02/22/2021 Comorbid Cataracts, Coronary Artery Disease, Hypertension, Myocardial Weeks Of Treatment:  16 History: Infarction, Peripheral Arterial Disease, Type II Diabetes, Clustered Wound: No Osteomyelitis, Neuropathy Photos Wound Measurements Length: (cm) 5.9 Width: (cm) 2.7 Depth: (cm) 0.2 Area: (cm) 12.511 Volume: (cm) 2.502 % Reduction in Area: 11.5% % Reduction in Volume: 82.3% Epithelialization: Small (1-33%) Tunneling: Yes Position (o'clock): 12 Maximum Distance: (cm) 3 Undermining: No Wound Description Classification: Grade 3 Wound Margin: Distinct, outline attached Exudate Amount: Medium Exudate Type: Serosanguineous Exudate Color: red, brown Foul Odor After Cleansing: No Slough/Fibrino Yes Wound Bed Granulation Amount: Large (67-100%) Exposed Structure Granulation Quality: Red Fascia Exposed: No Necrotic Amount: Small (1-33%)  Fat Layer (Subcutaneous Tissue) Exposed: Yes Necrotic Quality: Adherent Slough Tendon Exposed: No Muscle Exposed: No Joint Exposed: No Bone Exposed: Yes Treatment Notes Wound #1 (Foot) Wound Laterality: Left, Medial Cleanser Wound Cleanser Discharge Instruction: Cleanse the wound with wound cleanser prior to applying a clean dressing using gauze sponges, not tissue or cotton balls. Peri-Wound Care Skin Prep Discharge Instruction: Use skin prep as directed Zinc Oxide Ointment 30g tube Discharge Instruction: Apply Zinc Oxide to periwound with each dressing change Topical Primary Dressing Promogran Prisma Matrix, 4.34 (sq in) (silver collagen) Discharge Instruction: Moisten collagen with saline or hydrogel Secondary Dressing ABD Pad, 5x9 Discharge Instruction: In clinic. Home Health to apply wound vac. Woven Gauze Sponge, Non-Sterile 4x4 in Discharge Instruction: In clinic. Home Health to apply wound vac. Secured With [employer], 4.5x3.1 (in/yd) Discharge Instruction: In clinic. Home Health to apply wound vac. 62M Medipore H Soft Cloth Surgical T ape, 4 x 10 (in/yd) Discharge Instruction: In clinic. Home Health to  apply wound vac. Compression Wrap Compression Stockings Add-Ons Electronic Signature(s) Signed: 12/16/2021 4:34:44 PM By: Adline Peals Entered By: Adline Peals on 12/16/2021 13:02:46 -------------------------------------------------------------------------------- Vitals Details Patient Name: Date of Service: NA Colin [name] 12/16/2021 12:45 PM Medical Record Number: [id number] Patient Account Number: [id number] Date of Birth/Sex: Treating RN: 02-May-1956 (66 y.o. [name] Primary Care [place]: [name] Other Clinician: Referring Dantonio Justen: Treating Rheba Diamond/Extender: [name] Weeks in Treatment: 16 Vital Signs Time Taken: 12:51 Temperature (F): 97.4 Height (in): 74 Pulse (bpm): 76 Weight (lbs): 186 Respiratory Rate (breaths/min): 18 Body Mass Index (BMI): 23.9 Blood Pressure (mmHg): 169/95 Capillary Blood Glucose (mg/dl): 204 Reference Range: 80 - 120 mg / dl Electronic Signature(s) Signed: 12/16/2021 4:34:44 PM By: Adline Peals Entered By: Adline Peals on 12/16/2021 12:51:26

## 2021-12-16 NOTE — Progress Notes (Signed)
SIMON, LLAMAS ([id number]) Visit Report for 12/16/2021 SuperBill Details Patient Name: Date of Service: NA [name] 12/16/2021 Medical Record Number: [id number] Patient Account Number: [id number] Date of Birth/Sex: Treating RN: 1956-05-22 (66 y.o. 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 Facility Procedures CPT4 Code Description Modifier Quantity [id number] G0277-(Facility Use Only) HBOT full body chamber, , 4 ICD-10 Diagnosis Description E11.621 Type 2 diabetes mellitus with foot ulcer L97.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 M86.672 Other chronic osteomyelitis, left ankle and foot 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

## 2021-12-16 NOTE — Progress Notes (Signed)
OLDEN, KLAUER ([id number]) Visit Report for 12/15/2021 SuperBill Details Patient Name: Date of Service: NA [name] 12/15/2021 Medical Record Number: [id number] Patient Account Number: [id number] Date of Birth/Sex: Treating RN: Jul 15, 1955 (66 y.o. 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 Facility Procedures CPT4 Code Description Modifier Quantity [id number] G0277-(Facility Use Only) HBOT full body chamber, , 4 ICD-10 Diagnosis Description E11.621 Type 2 diabetes mellitus with foot ulcer L97.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 M86.672 Other chronic osteomyelitis, left ankle and foot 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

## 2021-12-16 NOTE — Progress Notes (Signed)
HULEN, MANDLER ([id number]) Visit Report for 12/16/2021 Arrival Information Details Patient Name: Date of Service: NA [name] 12/16/2021 10:00 A M Medical Record Number: [id number] Patient Account Number: [id number] Date of Birth/Sex: Treating RN: 1955-09-02 (66 y.o. [name] Primary Care Nechuma Boven: [name] Other Clinician: [name] Referring Areeba Sulser: Treating Taira Knabe/Extender: [name] in Treatment: 16 Visit Information History Since Last Visit All ordered tests and consults were completed: Yes Patient Arrived: Knee Scooter Added or deleted any medications: No Arrival Time: 09:19 Any new allergies or adverse reactions: No Accompanied By: self Had a fall or experienced change in No Transfer Assistance: None activities of daily living that may affect Patient Identification Verified: Yes risk of falls: Secondary Verification Process Completed: Yes Signs or symptoms of abuse/neglect since last visito No Patient Requires Transmission-Based Precautions: No Hospitalized since last visit: No Patient Has Alerts: Yes Implantable device outside of the clinic excluding No Patient Alerts: Patient on Blood Thinner cellular tissue based products placed in the center since last visit: Pain Present Now: No Electronic Signature(s) Signed: 12/16/2021 1:41:31 PM By: [name] CHT EMT BS , , Entered By: [name] on 12/16/2021 13:12:06 -------------------------------------------------------------------------------- Encounter Discharge Information Details Patient Name: Date of Service: [name] 12/16/2021 10:00 A M Medical Record Number: [id number] Patient Account Number: [id number] Date of Birth/Sex: Treating RN: 1956/02/07 (66 y.o. [name] Primary Care Jamiyah Dingley: [name] Other Clinician: [name] Referring Anaiza Behrens: Treating Egor Fullilove/Extender: [name] in  Treatment: 16 Encounter Discharge Information Items Discharge Condition: Stable Ambulatory Status: Knee Scooter Discharge Destination: Other (Note Required) Transportation: Other Accompanied By: spouse Schedule Follow-up Appointment: No Clinical Summary of Care: Notes Patient has wound care appointment after treatment. Electronic Signature(s) Signed: 12/16/2021 1:41:31 PM By: [name] CHT EMT BS , , Entered By: [name] on 12/16/2021 13:24:55 -------------------------------------------------------------------------------- Vitals Details Patient Name: Date of Service: NA [name] 12/16/2021 10:00 A M Medical Record Number: [id number] Patient Account Number: [id number] Date of Birth/Sex: Treating RN: [birth year] (66 y.o. [name] Primary Care Mally Gavina: [name] Other Clinician: [name] Referring Channon Brougher: Treating Valor [place]/Extender: [name]: 16 Vital Signs Time Taken: 09:37 Temperature (F): 98.1 Height (in): 74 Pulse (bpm): 79 Weight (lbs): 186 Respiratory Rate (breaths/min): 16 Body Mass Index (BMI): 23.9 Blood Pressure (mmHg): 134/74 Capillary Blood Glucose (mg/dl): 833 Reference Range: 80 - 120 mg / dl Electronic Signature(s) Signed: 12/16/2021 1:41:31 PM By: [name] CHT EMT BS , , Entered By: [name] on 12/16/2021 13:12:34

## 2021-12-16 NOTE — Progress Notes (Signed)
[name], [name] ([id number]) Visit Report for 12/16/2021 Chief Complaint Document Details Patient Name: Date of Service: NA [name] 12/16/2021 12:45 PM Medical Record Number: [id number] Patient Account Number: [id number] Date of Birth/Sex: Treating RN: [birth year] (66 y.o. [name] Primary Care Provider: [name] Other Clinician: Referring Provider: Treating Provider/Extender: [name] in Treatment: 16 Information Obtained from: Patient Chief Complaint Patients presents for treatment of an open diabetic ulcer with exposed bone and osteomyelitis Electronic Signature(s) Signed: 12/16/2021 1:15:47 PM By: [name] MD FACS Entered By: [name] on 12/16/2021 13:15:47 -------------------------------------------------------------------------------- Debridement Details Patient Name: Date of Service: NA Colin Nephew A. 12/16/2021 12:45 PM Medical Record Number: [id number] Patient Account Number: [id number] Date of Birth/Sex: Treating RN: 08-24-1955 (66 y.o. [name] Primary Care Provider: [name] Other Clinician: Referring Provider: Treating Provider/Extender: [name] in Treatment: 16 Debridement Performed for Assessment: Wound #1 Left,Medial Foot Performed By: Physician [name], MD Debridement Type: Debridement Severity of Tissue Pre Debridement: Fat layer exposed Level of Consciousness (Pre-procedure): Awake and Alert Pre-procedure Verification/Time Out Yes - 13:05 Taken: Start Time: 13:05 Pain Control: Lidocaine 5% topical ointment T Area Debrided (L x W): otal 5.9 (cm) x 2.7 (cm) = 15.93 (cm) Tissue and other material debrided: Viable, Non-Viable, Slough, Subcutaneous, Slough Level: Skin/Subcutaneous Tissue Debridement Description: Excisional Instrument: Curette Bleeding: Minimum Hemostasis Achieved: Pressure Procedural Pain: 0 Post Procedural Pain: 0 Response to  Treatment: Procedure was tolerated well Level of Consciousness (Post- Awake and Alert procedure): Post Debridement Measurements of Total Wound Length: (cm) 5.9 Width: (cm) 2.7 Depth: (cm) 0.2 Volume: (cm) 2.502 Character of Wound/Ulcer Post Debridement: Improved Severity of Tissue Post Debridement: Fat layer exposed Post Procedure Diagnosis Same as Pre-procedure Electronic Signature(s) Signed: 12/16/2021 2:32:39 PM By: [name] MD FACS Signed: 12/16/2021 4:34:44 PM By: Samuella Bruin Entered By: Samuella Bruin on 12/16/2021 13:06:36 -------------------------------------------------------------------------------- HPI Details Patient Name: Date of Service: [name] 12/16/2021 12:45 PM Medical Record Number: [id number] Patient Account Number: [id number] Date of Birth/Sex: Treating RN: Nov 30, 1955 (66 y.o. [name] Primary Care Provider: [name] Other Clinician: Referring Provider: Treating Provider/Extender: [name]: 16 History of Present Illness HPI Description: ADMISSION 08/25/2021 This is a 66 year old man man who initially presented to his primary care provider in September 2022 with pain in his left foot. He was sent for an x-ray and while the x-ray was being performed, the tech pointed out a wound on his foot that the patient was not aware existed. He does have type 2 diabetes with significant neuropathy. His diabetes is suboptimally controlled with his most recent A1c being 8.5. He also has a history of coronary artery disease status post three- vessel CABG. he was initially seen by orthopedics, but they referred him to [facility]. He has undergone at least 7 operations/debridements and several applications of skin substitute under the care of podiatry. He has been in a wound VAC for much of this time. His most recent procedure was July 28, 2021. A portion of the talus was biopsied and  was found to be consistent with osteomyelitis. Culture also returned positive for corynebacterium. He was seen on August 16, 2021 by infectious disease. A PICC line has been placed and he will be receiving a 6-week course of IV daptomycin and cefepime. In October 2022, he underwent lower extremity vascular studies. Results are copied here: Right: Resting right ankle-brachial  index is within normal range. No evidence of significant right lower extremity arterial disease. The right toe-brachial index is abnormal. Left: Resting left ankle-brachial index indicates mild left lower extremity arterial disease. The left toe-brachial index is abnormal. He has not been seen by vascular surgery despite these findings. He presented to clinic today in a cam boot and is using a knee scooter to offload. Wound VAC was in place. Once this was removed, a large ulcer was identified on the left midfoot/ankle. Bone is frankly exposed. There is no malodorous or purulent drainage. There is some granulation tissue over the central portion of the exposed bone. There is a tunnel that extends posteriorly for roughly 10 cm. It has been discussed with him by multiple providers that he is at very high risk of losing his lower leg because of this wound. He is extremely eager to avoid this outcome and is here today to review his options as well as receive ongoing wound care. 09/03/2021: Here for reevaluation of his wound. There does not appear to have been any substantial improvement overall since our last visit. He has been in a wound VAC with white foam overlying the exposed bone. We are working on getting him approved for hyperbaric oxygen therapy. 09/10/2021: We are in the process of getting him cleared to begin hyperbaric oxygen therapy. He still needs to obtain a chest x-ray. Although the wound measurements are roughly the same, I think the overall appearance of the wound is better. The exposed bone has a bit more granulation  tissue covering it. He has not received a vascular surgery appointment to reevaluate his flow to the wound. 09/17/2021: He has been approved for hyperbaric oxygen therapy and completed his chest x-ray, which I reviewed and it appears normal. The tunnels at the 12 and 10:00 positions are smaller. There is more granulation tissue covering the exposed bone and the undermining has decreased. He still has not received a vascular surgery appointment. 09/24/2021: He initiated hyperbaric oxygen therapy this week and is tolerating it well. He has an appointment with vascular surgery coming up on May 16. The granulation tissue is covering more of the exposed bone and both tunnels are a bit smaller. 10/01/2021: He continues to tolerate hyperbaric oxygen therapy. He saw infectious disease and they are planning to pull his PICC line. He has been initiated on oral antibiotics (doxycycline and Augmentin). The wound looks about the same but the tunnels are a little bit smaller. The skin seems to be contracting somewhat around the exposed bone. 10/08/2021: The wound is still about the same size, but the tunnels continue to come in and the skin is contracting around the exposed bone. He continues to have some accumulation of necrotic material in the inferoposterior aspect of the wound as well as accumulation at the 12:00 tunnel area. 10/15/2021: The wound is smaller today. The tunnels continue to come in. There is less necrotic tissue present. He does have some periwound maceration. 10/22/2021: The wound is about the same size. There is a little bit less undermining at the distal portion. The exposed bone is dark and I am not sure if this is staining from silver nitrate or his VAC sponge or if it represents necrosis. The tunnels are shallower but he does have some serous drainage coming from the 10:00 tunnel. He continues to tolerate hyperbaric oxygen therapy well. 10/29/2021: The undermining continues to improve. The tunnels  are about the same. He has good granulation tissue overlying the majority of the  exposed bone. It does appear that perhaps the tubing from his wound VAC has been eroding the skin at the 12 clock position. He continues to accumulate senescent epithelium around the borders of the wound. 11/05/2021: The undermining is almost completely resolved. The tunnels have contracted fairly significantly. No significant slough or debris accumulation. There is still senescent epithelium accumulation around the borders of the wound. He has been tolerating hyperbaric oxygen therapy well. 11/12/2021: Despite the measurements of the wound being about the same, the wound has changed in its shape and overall, I think it is improved. The undermining has resolved and the tunnels continue to shorten. There is good granulation tissue encroaching over the small area of bone that has remained exposed at the 12 o'clock position. Minimal slough accumulation. He continues to tolerate hyperbaric oxygen therapy well. 11/19/2021: I took a PCR culture last week. There was overgrowth of yeast. He is already taking suppressive doxycycline and Augmentin. I added fluconazole to his regimen. The wound is smaller again today. The tunnels continue to shorten. He continues to do well with hyperbaric oxygen therapy. 11/26/2021: For some reason, his foot has become macerated. The wound is narrower but about the same dimensions in its longitudinal aspect. The tunnels continue to shorten. He has some slough buildup on the wound as well as some heaped up senescent epithelium around the perimeter. 12/03/2021: No further maceration of his foot has occurred. The wound has contracted quite significantly from last week. The tunnel at 10:00 is closed. The tunnel at 12:00 is down to just a couple of millimeters. No other undermining is present. There is soft tissue coverage of the previously exposed bone. There is just a bit of slough and biofilm on the wound  surface. 12/10/2021: The wound is looking good. It turns out the tunnel at 12:00 is only exposed when the patient dorsiflexes his foot. It is about 2 cm in depth when he does this; when his foot is in plantarflexion, the tunnel is closed. The bone that was visible at the 12:00 tunnel is completely covered with granulation tissue, but there does feel like some exposed bone deeper into the tunnel area. There is senescent skin heaped up around the periphery. Minimal slough on the wound surface. 12/16/2021: The wound dimensions are roughly the same. The surface has nice granulation tissue. The exposed bone at the 12:00 tunnel continues to be covered with more soft tissue. Electronic Signature(s) Signed: 12/16/2021 1:24:11 PM By: [name] MD FACS Entered By: [name] on 12/16/2021 13:24:10 -------------------------------------------------------------------------------- Physical Exam Details Patient Name: Date of Service: NA Colin Nephew A. 12/16/2021 12:45 PM Medical Record Number: [id number] Patient Account Number: [id number] Date of Birth/Sex: Treating RN: 1955-07-13 (66 y.o. [name] Primary Care Provider: [name] Other Clinician: Referring Provider: Treating Provider/Extender: [name]: 16 Constitutional Hypertensive, asymptomatic. . . . No acute distress.Marland Kitchen Respiratory Normal work of breathing on room air.. Notes 12/16/2021: The wound dimensions are roughly the same. The surface has nice granulation tissue. The exposed bone at the 12:00 tunnel continues to be covered with more soft tissue. Electronic Signature(s) Signed: 12/16/2021 1:25:45 PM By: [name] MD FACS Entered By: [name] on 12/16/2021 13:25:45 -------------------------------------------------------------------------------- Physician Orders Details Patient Name: Date of Service: NA Colin Nephew A. 12/16/2021 12:45 PM Medical Record Number:  [id number] Patient Account Number: [id number] Date of Birth/Sex: Treating RN: April 03, 1956 (66 y.o. 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 - Dr. Lady Gary - Room 2 Return A Bathing/ Shower/ Hygiene May shower with protection but do not get wound dressing(s) wet. Negative Presssure Wound Therapy Wound Vac to wound continuously at 122mm/hg pressure Black Foam White Foam - Apply white foam or saline moistened gauze to tunnel at 10:00 and 12:00 Other: - Apply silver collagen on wound bed, under black foam Home Health No change in wound care orders this week; continue Home Health for wound care. May utilize formulary equivalent dressing for wound treatment orders unless otherwise specified. Dressing changes to be completed by Home Health on Monday / Wednesday / Friday except when patient has scheduled visit at [facility]. Other Home Health Orders/Instructions: [name]: Change vac 3x week Mon. Wed. and Fri. after wound care visit. Hyperbaric Oxygen Therapy Evaluate for HBO Therapy Indication: - Wagner 3 diabetic ulcer left foot If appropriate for treatment, begin HBOT per protocol: 2.5 ATA for 90 Minutes with 2 Five (5) Minute A Breaks ir Total Number of Treatments: - 11/05/2021 additional 40 treatments. T of 80 otal One treatments per day (delivered Monday through Friday unless otherwise specified in  Special Instructions below): Finger stick Blood Glucose Pre- and Post- HBOT Treatment. Follow Hyperbaric Oxygen Glycemia Protocol A frin (Oxymetazoline HCL) 0.05% nasal spray - 1 spray in both nostrils daily as needed prior to HBO treatment for difficulty clearing ears Wound Treatment Wound #1 - Foot Wound Laterality: Left, Medial Cleanser: Wound Cleanser 3 x Per Week/30 Days Discharge Instructions: Cleanse the wound with wound cleanser prior to applying a clean dressing using gauze sponges, not tissue or cotton balls. Peri-Wound Care: Skin Prep 3 x Per Week/30 Days Discharge Instructions: Use skin prep as directed Peri-Wound Care: Zinc Oxide Ointment 30g tube 3 x Per Week/30 Days Discharge Instructions: Apply Zinc Oxide to periwound with each dressing change Prim Dressing: Promogran Prisma Matrix, 4.34 (sq in) (silver collagen) 3 x Per Week/30 Days ary Discharge Instructions: Moisten collagen with saline or hydrogel Secondary Dressing: ABD Pad, 5x9 3 x Per Week/30 Days Discharge Instructions: In clinic. Home Health to apply wound vac. Secondary Dressing: Woven Gauze Sponge, Non-Sterile 4x4 in 3 x Per Week/30 Days Discharge Instructions: In clinic. Home Health to apply wound vac. Secured With: [employer], 4.5x3.1 (in/yd) 3 x Per Week/30 Days Discharge Instructions: In clinic. Home Health to apply wound vac. Secured With: 18M Medipore H Soft Cloth Surgical T ape, 4 x 10 (in/yd) 3 x Per Week/30 Days Discharge Instructions: In clinic. Home Health to apply wound vac. GLYCEMIA INTERVENTIONS PROTOCOL PRE-HBO GLYCEMIA INTERVENTIONS ACTION INTERVENTION Obtain pre-HBO capillary blood glucose (ensure 1 physician order is in chart). A. Notify HBO physician and await physician orders. 2 If result is 70 mg/dl or below: B. If the result meets the hospital definition of a critical result, follow hospital policy. A. Give patient an 8 ounce Glucerna Shake, an 8 ounce Ensure, or 8 ounces  of a Glucerna/Ensure equivalent dietary supplement*. B. Wait 30 minutes. If result is 71 mg/dl to 045 mg/dl: C. Retest patients capillary blood glucose (CBG).  D. If result greater than or equal to 110 mg/dl, proceed with HBO. If result less than 110 mg/dl, notify HBO physician and consider holding HBO. If result is 131 mg/dl to 161 mg/dl: A. Proceed with HBO. A. Notify HBO physician and await physician orders. B. It is recommended to hold HBO and do If result is 250 mg/dl or greater: blood/urine ketone testing. C. If the result meets the hospital definition of a critical result, follow hospital policy. POST-HBO GLYCEMIA INTERVENTIONS ACTION INTERVENTION Obtain post HBO capillary blood glucose (ensure 1 physician order is in chart). A. Notify HBO physician and await physician orders. 2 If result is 70 mg/dl or below: B. If the result meets the hospital definition of a critical result, follow hospital policy. A. Give patient an 8 ounce Glucerna Shake, an 8 ounce Ensure, or 8 ounces of a Glucerna/Ensure equivalent dietary supplement*. B. Wait 15 minutes for symptoms of If result is 71 mg/dl to 096 mg/dl: hypoglycemia (i.e. nervousness, anxiety, sweating, chills, clamminess, irritability, confusion, tachycardia or dizziness). C. If patient asymptomatic, discharge patient. If patient symptomatic, repeat capillary blood glucose (CBG) and notify HBO physician. If result is 101 mg/dl to 045 mg/dl: A. Discharge patient. A. Notify HBO physician and await physician orders. B. It is recommended to do blood/urine ketone If result is 250 mg/dl or greater: testing. C. If the result meets the hospital definition of a critical result, follow hospital policy. *Juice or candies are NOT equivalent products. If patient refuses the Glucerna or Ensure, please consult the hospital dietitian for an appropriate substitute. Electronic Signature(s) Signed: 12/16/2021 2:32:39 PM By: [name] MD FACS Entered By: [name] on 12/16/2021 13:25:58 -------------------------------------------------------------------------------- Problem List Details Patient Name: Date of Service: NA Colin Nephew A. 12/16/2021 12:45 PM Medical Record Number: [id number] Patient Account Number: [id number] Date of Birth/Sex: Treating RN: [birth year] (66 y.o. 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 12/16/2021 12:45 PM Medical Record Number: [id number] Patient Account Number: [id number] Date of Birth/Sex: Treating RN: [birth year] (66 y.o. [name] Primary Care Provider: [name] Other Clinician: Referring Provider: Treating Provider/Extender: [name] in Treatment: 16 Subjective Chief Complaint Information obtained from Patient Patients presents for treatment of an open diabetic ulcer with exposed bone and osteomyelitis History of Present Illness (HPI) ADMISSION 08/25/2021 This is a 66 year old man who  initially presented to his primary care provider in September 2022 with pain in his left foot. He was sent for an x-ray and while the x-ray was being performed, the tech pointed out a wound on his foot that the patient was not aware existed. He does have type 2 diabetes with significant neuropathy. His diabetes is suboptimally controlled with his most recent A1c being 8.5. He also has a history of  coronary artery disease status post three- vessel CABG. he was initially seen by orthopedics, but they referred him to [facility]. He has undergone at least 7 operations/debridements and several applications of skin substitute under the care of podiatry. He has been in a wound VAC for much of this time. His most recent procedure was July 28, 2021. A portion of the talus was biopsied and was found to be consistent with osteomyelitis. Culture also returned positive for corynebacterium. He was seen on August 16, 2021 by infectious disease. A PICC line has been placed and he will be receiving a 6-week course of IV daptomycin and cefepime. In October 2022, he underwent lower extremity vascular studies. Results are copied here: Right: Resting right ankle-brachial index is within normal range. No evidence of significant right lower extremity arterial disease. The right toe-brachial index is abnormal. Left: Resting left ankle-brachial index indicates mild left lower extremity arterial disease. The left toe-brachial index is abnormal. He has not been seen by vascular surgery despite these findings. He presented to clinic today in a cam boot and is using a knee scooter to offload. Wound VAC was in place. Once this was removed, a large ulcer was identified on the left midfoot/ankle. Bone is frankly exposed. There is no malodorous or purulent drainage. There is some granulation tissue over the central portion of the exposed bone. There is a tunnel that extends posteriorly for roughly 10 cm. It has  been discussed with him by multiple providers that he is at very high risk of losing his lower leg because of this wound. He is extremely eager to avoid this outcome and is here today to review his options as well as receive ongoing wound care. 09/03/2021: Here for reevaluation of his wound. There does not appear to have been any substantial improvement overall since our last visit. He has been in a wound VAC with white foam overlying the exposed bone. We are working on getting him approved for hyperbaric oxygen therapy. 09/10/2021: We are in the process of getting him cleared to begin hyperbaric oxygen therapy. He still needs to obtain a chest x-ray. Although the wound measurements are roughly the same, I think the overall appearance of the wound is better. The exposed bone has a bit more granulation tissue covering it. He has not received a vascular surgery appointment to reevaluate his flow to the wound. 09/17/2021: He has been approved for hyperbaric oxygen therapy and completed his chest x-ray, which I reviewed and it appears normal. The tunnels at the 12 and 10:00 positions are smaller. There is more granulation tissue covering the exposed bone and the undermining has decreased. He still has not received a vascular surgery appointment. 09/24/2021: He initiated hyperbaric oxygen therapy this week and is tolerating it well. He has an appointment with vascular surgery coming up on May 16. The granulation tissue is covering more of the exposed bone and both tunnels are a bit smaller. 10/01/2021: He continues to tolerate hyperbaric oxygen therapy. He saw infectious disease and they are planning to pull his PICC line. He has been initiated on oral antibiotics (doxycycline and Augmentin). The wound looks about the same but the tunnels are a little bit smaller. The skin seems to be contracting somewhat around the exposed bone. 10/08/2021: The wound is still about the same size, but the tunnels continue to come  in and the skin is contracting around the exposed bone. He continues to have some accumulation of necrotic material in the  inferoposterior aspect of the wound as well as accumulation at the 12:00 tunnel area. 10/15/2021: The wound is smaller today. The tunnels continue to come in. There is less necrotic tissue present. He does have some periwound maceration. 10/22/2021: The wound is about the same size. There is a little bit less undermining at the distal portion. The exposed bone is dark and I am not sure if this is staining from silver nitrate or his VAC sponge or if it represents necrosis. The tunnels are shallower but he does have some serous drainage coming from the 10:00 tunnel. He continues to tolerate hyperbaric oxygen therapy well. 10/29/2021: The undermining continues to improve. The tunnels are about the same. He has good granulation tissue overlying the majority of the exposed bone. It does appear that perhaps the tubing from his wound VAC has been eroding the skin at the 12 clock position. He continues to accumulate senescent epithelium around the borders of the wound. 11/05/2021: The undermining is almost completely resolved. The tunnels have contracted fairly significantly. No significant slough or debris accumulation. There is still senescent epithelium accumulation around the borders of the wound. He has been tolerating hyperbaric oxygen therapy well. 11/12/2021: Despite the measurements of the wound being about the same, the wound has changed in its shape and overall, I think it is improved. The undermining has resolved and the tunnels continue to shorten. There is good granulation tissue encroaching over the small area of bone that has remained exposed at the 12 o'clock position. Minimal slough accumulation. He continues to tolerate hyperbaric oxygen therapy well. 11/19/2021: I took a PCR culture last week. There was overgrowth of yeast. He is already taking suppressive doxycycline and  Augmentin. I added fluconazole to his regimen. The wound is smaller again today. The tunnels continue to shorten. He continues to do well with hyperbaric oxygen therapy. 11/26/2021: For some reason, his foot has become macerated. The wound is narrower but about the same dimensions in its longitudinal aspect. The tunnels continue to shorten. He has some slough buildup on the wound as well as some heaped up senescent epithelium around the perimeter. 12/03/2021: No further maceration of his foot has occurred. The wound has contracted quite significantly from last week. The tunnel at 10:00 is closed. The tunnel at 12:00 is down to just a couple of millimeters. No other undermining is present. There is soft tissue coverage of the previously exposed bone. There is just a bit of slough and biofilm on the wound surface. 12/10/2021: The wound is looking good. It turns out the tunnel at 12:00 is only exposed when the patient dorsiflexes his foot. It is about 2 cm in depth when he does this; when his foot is in plantarflexion, the tunnel is closed. The bone that was visible at the 12:00 tunnel is completely covered with granulation tissue, but there does feel like some exposed bone deeper into the tunnel area. There is senescent skin heaped up around the periphery. Minimal slough on the wound surface. 12/16/2021: The wound dimensions are roughly the same. The surface has nice granulation tissue. The exposed bone at the 12:00 tunnel continues to be covered with more soft tissue. Patient History Information obtained from Patient. Family History Cancer - Father, Diabetes - Father,Mother,Paternal Grandparents, Heart Disease - Father, Hypertension - Father, No family history of Hereditary Spherocytosis, Kidney Disease, Lung Disease, Seizures, Stroke, Thyroid Problems, Tuberculosis. Social History Never smoker, Marital Status - Married, Alcohol Use - Rarely, Drug Use - No History, Caffeine Use -  Daily. Medical  History Eyes Patient has history of Cataracts - Removed 2008 Cardiovascular Patient has history of Coronary Artery Disease, Hypertension, Myocardial Infarction, Peripheral Arterial Disease Endocrine Patient has history of Type II Diabetes Musculoskeletal Patient has history of Osteomyelitis Neurologic Patient has history of Neuropathy Medical A Surgical History Notes nd Cardiovascular Hypercholesterolemia Abnormal EKG CABG X3 2019 Gastrointestinal GERD Musculoskeletal Diabetic foot ulcer Objective Constitutional Hypertensive, asymptomatic. No acute distress.. Vitals Time Taken: 12:51 PM, Height: 74 in, Weight: 186 lbs, BMI: 23.9, Temperature: 97.4 F, Pulse: 76 bpm, Respiratory Rate: 18 breaths/min, Blood Pressure: 169/95 mmHg, Capillary Blood Glucose: 204 mg/dl. Respiratory Normal work of breathing on room air.. General Notes: 12/16/2021: The wound dimensions are roughly the same. The surface has nice granulation tissue. The exposed bone at the 12:00 tunnel continues to be covered with more soft tissue. Integumentary (Hair, Skin) Wound #1 status is Open. Original cause of wound was Gradually Appeared. The date acquired was: 02/22/2021. The wound has been in treatment 16 weeks. The wound is located on the Left,Medial Foot. The wound measures 5.9cm length x 2.7cm width x 0.2cm depth; 12.511cm^2 area and 2.502cm^3 volume. There is bone and Fat Layer (Subcutaneous Tissue) exposed. There is no undermining noted, however, there is tunneling at 12:00 with a maximum distance of 3cm. There is a medium amount of serosanguineous drainage noted. The wound margin is distinct with the outline attached to the wound base. There is large (67- 100%) red granulation within the wound bed. There is a small (1-33%) amount of necrotic tissue within the wound bed including Adherent Slough. 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 There was a Excisional Skin/Subcutaneous Tissue Debridement with a total area of 15.93 sq cm performed by [name], MD. With the following instrument(s): Curette to remove Viable and Non-Viable tissue/material. Material removed includes Subcutaneous Tissue and Slough and after achieving pain control using Lidocaine 5% topical ointment. No specimens were taken. A time out was conducted at 13:05, prior to the start of the procedure. A Minimum amount of bleeding was controlled with Pressure. The procedure was tolerated well with a pain level of 0 throughout and a pain level of 0 following the procedure. Post Debridement Measurements: 5.9cm length x 2.7cm width x 0.2cm depth; 2.502cm^3 volume. Character of Wound/Ulcer Post Debridement is improved. Severity of Tissue Post Debridement is: Fat layer exposed. Post procedure Diagnosis Wound #1: Same as Pre-Procedure Plan Follow-up Appointments: Return Appointment in 1 week. - Dr. Lady Gary - Room 2 Bathing/ Shower/ Hygiene: May shower with protection but do not get wound dressing(s) wet. Negative Presssure Wound Therapy: Wound Vac to wound continuously at 153mm/hg pressure Black Foam White Foam - Apply white foam or saline moistened gauze to tunnel at 10:00 and 12:00 Other: - Apply silver collagen on wound bed, under black foam Home Health: No change in wound care orders this week; continue Home Health for wound care. May utilize formulary equivalent dressing for wound treatment orders unless otherwise specified. Dressing changes to be  completed by Home Health on Monday / Wednesday / Friday except when patient has scheduled visit at  Wound Care Center. Other Home Health Orders/Instructions: [name]: Change vac 3x week Mon. Wed. and Fri. after wound care visit. Hyperbaric Oxygen Therapy: Evaluate for HBO Therapy Indication: - Wagner 3 diabetic ulcer left foot If appropriate for treatment, begin HBOT per protocol: 2.5 ATA for 90 Minutes with 2 Five (5) Minute Air Breaks T Number of Treatments: - 11/05/2021 additional 40 treatments. T of 80 otal otal One treatments per day (delivered Monday through Friday unless otherwise specified in Special Instructions below): Finger stick Blood Glucose Pre- and Post- HBOT Treatment. Follow Hyperbaric Oxygen Glycemia Protocol Afrin (Oxymetazoline HCL) 0.05% nasal spray - 1 spray in both nostrils daily as needed prior to HBO treatment for difficulty clearing ears WOUND #1: - Foot Wound Laterality: Left, Medial Cleanser: Wound Cleanser 3 x Per Week/30 Days Discharge Instructions: Cleanse the wound with wound cleanser prior to applying a clean dressing using gauze sponges, not tissue or cotton balls. Peri-Wound Care: Skin Prep 3 x Per Week/30 Days Discharge Instructions: Use skin prep as directed Peri-Wound Care: Zinc Oxide Ointment 30g tube 3 x Per Week/30 Days Discharge Instructions: Apply Zinc Oxide to periwound with each dressing change Prim Dressing: Promogran Prisma Matrix, 4.34 (sq in) (silver collagen) 3 x Per Week/30 Days ary Discharge Instructions: Moisten collagen with saline or hydrogel Secondary Dressing: ABD Pad, 5x9 3 x Per Week/30 Days Discharge Instructions: In clinic. Home Health to apply wound vac. Secondary Dressing: Woven Gauze Sponge, Non-Sterile 4x4 in 3 x Per Week/30 Days Discharge Instructions: In clinic. Home Health to apply wound vac. Secured With: [employer], 4.5x3.1 (in/yd) 3 x Per Week/30 Days Discharge Instructions: In clinic. Home Health to apply  wound vac. Secured With: 54M Medipore H Soft Cloth Surgical T ape, 4 x 10 (in/yd) 3 x Per Week/30 Days Discharge Instructions: In clinic. Home Health to apply wound vac. 12/16/2021: The wound dimensions are roughly the same. The surface has nice granulation tissue. The exposed bone at the 12:00 tunnel continues to be covered with more soft tissue. I used a curette to debride slough and nonviable subcutaneous tissue from the wound. We will continue using Prisma silver collagen under his wound VAC. He will continue hyperbaric oxygen therapy. Follow-up in 1 week. Electronic Signature(s) Signed: 12/16/2021 1:26:35 PM By: [name] MD FACS Entered By: [name] on 12/16/2021 13:26:35 -------------------------------------------------------------------------------- HxROS Details Patient Name: Date of Service: [name] 12/16/2021 12:45 PM Medical Record Number: [id number] Patient Account Number: [id number] Date of Birth/Sex: Treating RN: [birth year] (67 y.o. [name] Primary Care Provider: [name] Other Clinician: Referring Provider: Treating Provider/Extender: [name]: 16 Information Obtained From Patient Eyes Medical History: Positive for: Cataracts - Removed 2008 Cardiovascular Medical History: Positive for: Coronary Artery Disease; Hypertension; Myocardial Infarction; Peripheral Arterial Disease Past Medical History Notes: Hypercholesterolemia Abnormal EKG CABG X3 2019 Gastrointestinal Medical History: Past Medical History Notes: GERD Endocrine Medical History: Positive for: Type II Diabetes Time with diabetes: 24 years Treated with: Insulin, Oral agents Blood sugar tested every day: Yes Tested : Musculoskeletal Medical History: Positive for: Osteomyelitis Past Medical History Notes: Diabetic foot ulcer Neurologic Medical History: Positive for: Neuropathy HBO Extended History  Items Eyes: Cataracts Immunizations Pneumococcal Vaccine: Received Pneumococcal Vaccination: Yes Received Pneumococcal Vaccination On or After 60th Birthday: Yes Implantable Devices Yes Family and Social History Cancer: Yes - Father; Diabetes: Yes - Father,Mother,Paternal Grandparents; Heart Disease: Yes - Father; Hereditary Spherocytosis: No; Hypertension: Yes - Father; Kidney Disease: No;  Lung Disease: No; Seizures: No; Stroke: No; Thyroid Problems: No; Tuberculosis: No; Never smoker; Marital Status - Married; Alcohol Use: Rarely; Drug Use: No History; Caffeine Use: Daily; Financial Concerns: No; Food, Clothing or Shelter Needs: No; Support System Lacking: No; Transportation Concerns: No Electronic Signature(s) Signed: 12/16/2021 2:32:39 PM By: Duanne Guessannon, Laron Angelini MD FACS Signed: 12/16/2021 4:34:44 PM By: Gelene MinkHerrington, Taylor Entered By: Duanne Guessannon, Vianne Grieshop on 12/16/2021 13:25:14 -------------------------------------------------------------------------------- SuperBill Details Patient Name: Date of Service: NA RRO Colin HalstedN, [name] 12/16/2021 Medical Record Number: [id number] Patient Account Number: [id number] Date of Birth/Sex: Treating RN: Feb 09, 1956 (66 y.o. 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 Facility Procedures CPT4 Code: [id number] Description: 11042 - DEB SUBQ TISSUE 20 SQ CM/< ICD-10 Diagnosis Description L97.324 Non-pressure chronic ulcer of left ankle with necrosis of  bone Modifier: Quantity: 1 Physician Procedures : CPT4 Code Description Modifier [id number] 99213 - WC PHYS LEVEL 3 - EST PT ICD-10 Diagnosis Description L97.324 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

## 2021-12-17 ENCOUNTER — Encounter (HOSPITAL_BASED_OUTPATIENT_CLINIC_OR_DEPARTMENT_OTHER): Payer: BC Managed Care – PPO | Admitting: General Surgery

## 2021-12-17 DIAGNOSIS — Z794 Long term (current) use of insulin: Secondary | ICD-10-CM | POA: Diagnosis not present

## 2021-12-17 DIAGNOSIS — I252 Old myocardial infarction: Secondary | ICD-10-CM | POA: Diagnosis not present

## 2021-12-17 DIAGNOSIS — L97322 Non-pressure chronic ulcer of left ankle with fat layer exposed: Secondary | ICD-10-CM | POA: Diagnosis not present

## 2021-12-17 DIAGNOSIS — D649 Anemia, unspecified: Secondary | ICD-10-CM | POA: Diagnosis not present

## 2021-12-17 DIAGNOSIS — L97324 Non-pressure chronic ulcer of left ankle with necrosis of bone: Secondary | ICD-10-CM | POA: Diagnosis not present

## 2021-12-17 DIAGNOSIS — E785 Hyperlipidemia, unspecified: Secondary | ICD-10-CM | POA: Diagnosis not present

## 2021-12-17 DIAGNOSIS — Z7982 Long term (current) use of aspirin: Secondary | ICD-10-CM | POA: Diagnosis not present

## 2021-12-17 DIAGNOSIS — E11610 Type 2 diabetes mellitus with diabetic neuropathic arthropathy: Secondary | ICD-10-CM | POA: Diagnosis not present

## 2021-12-17 DIAGNOSIS — E11621 Type 2 diabetes mellitus with foot ulcer: Secondary | ICD-10-CM | POA: Diagnosis not present

## 2021-12-17 DIAGNOSIS — E1140 Type 2 diabetes mellitus with diabetic neuropathy, unspecified: Secondary | ICD-10-CM | POA: Diagnosis not present

## 2021-12-17 DIAGNOSIS — E11622 Type 2 diabetes mellitus with other skin ulcer: Secondary | ICD-10-CM | POA: Diagnosis not present

## 2021-12-17 DIAGNOSIS — K219 Gastro-esophageal reflux disease without esophagitis: Secondary | ICD-10-CM | POA: Diagnosis not present

## 2021-12-17 DIAGNOSIS — Z7984 Long term (current) use of oral hypoglycemic drugs: Secondary | ICD-10-CM | POA: Diagnosis not present

## 2021-12-17 DIAGNOSIS — Z9181 History of falling: Secondary | ICD-10-CM | POA: Diagnosis not present

## 2021-12-17 DIAGNOSIS — Z951 Presence of aortocoronary bypass graft: Secondary | ICD-10-CM | POA: Diagnosis not present

## 2021-12-17 DIAGNOSIS — I2510 Atherosclerotic heart disease of native coronary artery without angina pectoris: Secondary | ICD-10-CM | POA: Diagnosis not present

## 2021-12-17 DIAGNOSIS — I10 Essential (primary) hypertension: Secondary | ICD-10-CM | POA: Diagnosis not present

## 2021-12-17 DIAGNOSIS — M86672 Other chronic osteomyelitis, left ankle and foot: Secondary | ICD-10-CM | POA: Diagnosis not present

## 2021-12-17 DIAGNOSIS — T8131XA Disruption of external operation (surgical) wound, not elsewhere classified, initial encounter: Secondary | ICD-10-CM | POA: Diagnosis not present

## 2021-12-17 DIAGNOSIS — M1990 Unspecified osteoarthritis, unspecified site: Secondary | ICD-10-CM | POA: Diagnosis not present

## 2021-12-17 LAB — GLUCOSE, CAPILLARY
Glucose-Capillary: 159 mg/dL — ABNORMAL HIGH (ref 70–99)
Glucose-Capillary: 160 mg/dL — ABNORMAL HIGH (ref 70–99)

## 2021-12-17 NOTE — Progress Notes (Signed)
ROC, STREETT ([id number]) Visit Report for 12/17/2021 SuperBill Details Patient Name: Date of Service: NA [name] 12/17/2021 Medical Record Number: [id number] Patient Account Number: [id number] Date of Birth/Sex: Treating RN: [birth year] (66 y.o. 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 Facility Procedures CPT4 Code Description Modifier Quantity [id number] G0277-(Facility Use Only) HBOT full body chamber, , 4 ICD-10 Diagnosis Description E11.621 Type 2 diabetes mellitus with foot ulcer L97.324 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 M86.672 Other chronic osteomyelitis, left ankle and foot E11.610 Type 2 diabetes mellitus with diabetic neuropathic arthropathy Physician Procedures Quantity CPT4 Code Description Modifier [id number] 99183 - WC PHYS HYPERBARIC OXYGEN THERAPY 1 ICD-10 Diagnosis Description E11.621 Type 2 diabetes mellitus with foot ulcer L97.324 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

## 2021-12-17 NOTE — Progress Notes (Addendum)
[name], [name] ([id number]) Visit Report for 12/17/2021 HBO Details Patient Name: Date of Service: NA [name] 12/17/2021 10:00 A M Medical Record Number: [id number] Patient Account Number: [id number] Date of Birth/Sex: Treating RN: [name] (66 y.o. [name], [name] Primary Care [name]: [name] Other Clinician: Haywood [name] Referring [name]: Treating [name]/Extender: [name] Weeks in Treatment: 16 HBO Treatment Course Details Treatment Course Number: 1 Ordering [name]: Duanne [name] T Treatments Ordered: otal 80 HBO Treatment Start Date: 09/20/2021 HBO Indication: Diabetic Ulcer(s) of the Lower Extremity Standard/Conservative Wound Care tried and failed greater than or equal to 30 days HBO Treatment Details Treatment Number: 62 Patient Type: Outpatient Chamber Type: Monoplace Chamber Serial #: [id number] Treatment Protocol: 2.0 ATA with 90 minutes oxygen, and no air breaks Treatment Details Compression Rate Down: 2.0 psi / minute De-Compression Rate Up: 2.0 psi / minute A breaks and breathing ir Compress Tx Pressure periods Decompress Decompress Begins Reached (leave unused spaces Begins Ends blank) Chamber Pressure (ATA 1 2 - - - - --2 1 ) Clock Time (24 hr) 09:49 10:01 10:31 10:36 11:06 11:11 - - 11:41 11:52 Treatment Length: 123 (minutes) Treatment Segments: 4 Vital Signs Capillary Blood Glucose Reference Range: 80 - 120 mg / dl HBO Diabetic Blood Glucose Intervention Range: <131 mg/dl or >629 mg/dl Time Vitals Blood Respiratory Capillary Blood Glucose Pulse Action Type: Pulse: Temperature: Taken: Pressure: Rate: Glucose (mg/dl): Meter #: Oximetry (%) Taken: Pre 09:43 139/87 83 18 98 159 Post 11:56 146/90 71 16 98.1 160 Treatment Response Treatment Toleration: Well Treatment Completion Status: Treatment Completed without Adverse Event Additional Procedure Documentation Tissue Sevierity: Necrosis of bone Physician  HBO Attestation: I certify that I supervised this HBO treatment in accordance with Medicare guidelines. A trained emergency response team is readily available per Yes hospital policies and procedures. Continue HBOT as ordered. Yes Electronic Signature(s) Signed: 12/17/2021 1:00:10 PM By: [name] CHT EMT BS , , Signed: 12/17/2021 1:40:53 PM By: [name] MD FACS Previous Signature: 12/17/2021 11:43:25 AM Version By: [name] MD FACS Previous Signature: 12/17/2021 10:55:11 AM Version By: [name] CHT EMT BS , , Entered By: [name] on 12/17/2021 13:00:10 -------------------------------------------------------------------------------- HBO Safety Checklist Details Patient Name: Date of Service: [name] A. 12/17/2021 10:00 A M Medical Record Number: [id number] Patient Account Number: [id number] Date of Birth/Sex: Treating RN: [name] (66 y.o. [name], [name] Primary Care Karleen Seebeck: [name] Other Clinician: Haywood [name] Referring [name]: Treating [name]/Extender: [name] Weeks in Treatment: 16 HBO Safety Checklist Items Safety Checklist Consent Form Signed Patient voided / foley secured and emptied When did you last eato 0645 Last dose of injectable or oral agent 0645 Ostomy pouch emptied and vented if applicable NA All implantable devices assessed, documented and approved NA Intravenous access site secured and place Valuables secured Linens and cotton and cotton/polyester blend (less than 51% polyester) Personal oil-based products / skin lotions / body lotions removed Wigs or hairpieces removed NA Smoking or tobacco materials removed NA Books / newspapers / magazines / loose paper removed Cologne, aftershave, perfume and deodorant removed Jewelry removed (may wrap wedding band) Make-up removed NA Hair care products removed Battery operated devices (external) removed Heating patches and chemical  warmers removed Titanium eyewear removed NA Nail polish cured greater than 10 hours NA Casting material cured greater than 10 hours NA Hearing aids removed NA Loose dentures or partials removed NA Prosthetics have been removed NA Patient demonstrates correct use  of air break device (if applicable) Patient concerns have been addressed Patient grounding bracelet on and cord attached to chamber Specifics for Inpatients (complete in addition to above) Medication sheet sent with patient NA Intravenous medications needed or due during therapy sent with patient NA Drainage tubes (e.g. nasogastric tube or chest tube secured and vented) NA Endotracheal or Tracheotomy tube secured NA Cuff deflated of air and inflated with saline NA Airway suctioned NA Notes Paper version used prior to treatment. Electronic Signature(s) Signed: 12/17/2021 10:54:25 AM By: [name] CHT EMT BS , , Entered By: [name] on 12/17/2021 10:54:24

## 2021-12-17 NOTE — Progress Notes (Addendum)
Colin, [name] ([id number]) Visit Report for 12/17/2021 Arrival Information Details Patient Name: Date of Service: NA [name] 12/17/2021 10:00 A M Medical Record Number: [id number] Patient Account Number: [id number] Date of Birth/Sex: Treating RN: 1956-01-06 (66 y.o. [name] Primary Care Christa Fasig: [name] Other Clinician: [name] Referring Kirtis Challis: Treating Isacc Turney/Extender: [name] in Treatment: 16 Visit Information History Since Last Visit All ordered tests and consults were completed: Yes Patient Arrived: Knee Scooter Added or deleted any medications: No Arrival Time: 09:30 Any new allergies or adverse reactions: No Accompanied By: spouse Had a fall or experienced change in No Transfer Assistance: None activities of daily living that may affect Patient Identification Verified: Yes risk of falls: Secondary Verification Process Completed: Yes Signs or symptoms of abuse/neglect since last visito No Patient Requires Transmission-Based Precautions: No Hospitalized since last visit: No Patient Has Alerts: Yes Implantable device outside of the clinic excluding No Patient Alerts: Patient on Blood Thinner cellular tissue based products placed in the center since last visit: Pain Present Now: No Electronic Signature(s) Signed: 12/17/2021 10:48:44 AM By: [name] CHT EMT BS , , Entered By: [name] on 12/17/2021 10:48:43 -------------------------------------------------------------------------------- Encounter Discharge Information Details Patient Name: Date of Service: NA Colin Nephew A. 12/17/2021 10:00 A M Medical Record Number: [id number] Patient Account Number: [id number] Date of Birth/Sex: Treating RN: Sep 19, 1955 (66 y.o. [name] Primary Care Akram Kissick: [name] Other Clinician: [name] Referring Priti Consoli: Treating Jonai Weyland/Extender: [name] in  Treatment: 16 Encounter Discharge Information Items Discharge Condition: Stable Ambulatory Status: Knee Scooter Discharge Destination: Home Transportation: Private Auto Accompanied By: spouse Schedule Follow-up Appointment: No Clinical Summary of Care: Electronic Signature(s) Signed: 12/17/2021 1:01:39 PM By: [name] CHT EMT BS , , Entered By: [name] on 12/17/2021 13:01:38 -------------------------------------------------------------------------------- Vitals Details Patient Name: Date of Service: NA Colin Nephew A. 12/17/2021 10:00 A M Medical Record Number: [id number] Patient Account Number: [id number] Date of Birth/Sex: Treating RN: 1955-08-20 (66 y.o. [name], [name] Primary Care Eliza Green: [name] Other Clinician: [name] Referring Afton Lavalle: Treating Tateanna Bach/Extender: [name]: 16 Vital Signs Time Taken: 09:43 Temperature (F): 98.0 Height (in): 74 Pulse (bpm): 83 Weight (lbs): 186 Respiratory Rate (breaths/min): 18 Body Mass Index (BMI): 23.9 Blood Pressure (mmHg): 139/87 Capillary Blood Glucose (mg/dl): 706 Reference Range: 80 - 120 mg / dl Electronic Signature(s) Signed: 12/17/2021 10:49:35 AM By: [name] CHT EMT BS , , Entered By: [name] on 12/17/2021 10:49:35

## 2021-12-18 DIAGNOSIS — T8131XA Disruption of external operation (surgical) wound, not elsewhere classified, initial encounter: Secondary | ICD-10-CM | POA: Diagnosis not present

## 2021-12-19 DIAGNOSIS — T8131XA Disruption of external operation (surgical) wound, not elsewhere classified, initial encounter: Secondary | ICD-10-CM | POA: Diagnosis not present

## 2021-12-20 ENCOUNTER — Encounter (HOSPITAL_BASED_OUTPATIENT_CLINIC_OR_DEPARTMENT_OTHER): Payer: BC Managed Care – PPO | Admitting: General Surgery

## 2021-12-20 DIAGNOSIS — E11610 Type 2 diabetes mellitus with diabetic neuropathic arthropathy: Secondary | ICD-10-CM | POA: Diagnosis not present

## 2021-12-20 DIAGNOSIS — M1990 Unspecified osteoarthritis, unspecified site: Secondary | ICD-10-CM | POA: Diagnosis not present

## 2021-12-20 DIAGNOSIS — T8131XA Disruption of external operation (surgical) wound, not elsewhere classified, initial encounter: Secondary | ICD-10-CM | POA: Diagnosis not present

## 2021-12-20 DIAGNOSIS — I10 Essential (primary) hypertension: Secondary | ICD-10-CM | POA: Diagnosis not present

## 2021-12-20 DIAGNOSIS — Z7984 Long term (current) use of oral hypoglycemic drugs: Secondary | ICD-10-CM | POA: Diagnosis not present

## 2021-12-20 DIAGNOSIS — D649 Anemia, unspecified: Secondary | ICD-10-CM | POA: Diagnosis not present

## 2021-12-20 DIAGNOSIS — E1140 Type 2 diabetes mellitus with diabetic neuropathy, unspecified: Secondary | ICD-10-CM | POA: Diagnosis not present

## 2021-12-20 DIAGNOSIS — E785 Hyperlipidemia, unspecified: Secondary | ICD-10-CM | POA: Diagnosis not present

## 2021-12-20 DIAGNOSIS — Z9181 History of falling: Secondary | ICD-10-CM | POA: Diagnosis not present

## 2021-12-20 DIAGNOSIS — Z951 Presence of aortocoronary bypass graft: Secondary | ICD-10-CM | POA: Diagnosis not present

## 2021-12-20 DIAGNOSIS — I2510 Atherosclerotic heart disease of native coronary artery without angina pectoris: Secondary | ICD-10-CM | POA: Diagnosis not present

## 2021-12-20 DIAGNOSIS — Z794 Long term (current) use of insulin: Secondary | ICD-10-CM | POA: Diagnosis not present

## 2021-12-20 DIAGNOSIS — K219 Gastro-esophageal reflux disease without esophagitis: Secondary | ICD-10-CM | POA: Diagnosis not present

## 2021-12-20 DIAGNOSIS — Z7982 Long term (current) use of aspirin: Secondary | ICD-10-CM | POA: Diagnosis not present

## 2021-12-20 DIAGNOSIS — I252 Old myocardial infarction: Secondary | ICD-10-CM | POA: Diagnosis not present

## 2021-12-20 DIAGNOSIS — L97322 Non-pressure chronic ulcer of left ankle with fat layer exposed: Secondary | ICD-10-CM | POA: Diagnosis not present

## 2021-12-20 DIAGNOSIS — E11622 Type 2 diabetes mellitus with other skin ulcer: Secondary | ICD-10-CM | POA: Diagnosis not present

## 2021-12-21 ENCOUNTER — Encounter (HOSPITAL_BASED_OUTPATIENT_CLINIC_OR_DEPARTMENT_OTHER): Payer: BC Managed Care – PPO | Admitting: General Surgery

## 2021-12-21 DIAGNOSIS — M86672 Other chronic osteomyelitis, left ankle and foot: Secondary | ICD-10-CM | POA: Diagnosis not present

## 2021-12-21 DIAGNOSIS — E11621 Type 2 diabetes mellitus with foot ulcer: Secondary | ICD-10-CM | POA: Diagnosis not present

## 2021-12-21 DIAGNOSIS — T8131XA Disruption of external operation (surgical) wound, not elsewhere classified, initial encounter: Secondary | ICD-10-CM | POA: Diagnosis not present

## 2021-12-21 DIAGNOSIS — E11610 Type 2 diabetes mellitus with diabetic neuropathic arthropathy: Secondary | ICD-10-CM | POA: Diagnosis not present

## 2021-12-21 DIAGNOSIS — I2510 Atherosclerotic heart disease of native coronary artery without angina pectoris: Secondary | ICD-10-CM | POA: Diagnosis not present

## 2021-12-21 DIAGNOSIS — L97324 Non-pressure chronic ulcer of left ankle with necrosis of bone: Secondary | ICD-10-CM | POA: Diagnosis not present

## 2021-12-21 LAB — GLUCOSE, CAPILLARY
Glucose-Capillary: 151 mg/dL — ABNORMAL HIGH (ref 70–99)
Glucose-Capillary: 136 mg/dL — ABNORMAL HIGH (ref 70–99)

## 2021-12-21 NOTE — Progress Notes (Signed)
MAZI, BRAILSFORD ([id number]) Visit Report for 12/21/2021 Problem List Details Patient Name: Date of Service: NA [name] 12/21/2021 10:00 A M Medical Record Number: [id number] Patient Account Number: [id number] Date of Birth/Sex: Treating RN: 1956-05-07 (66 y.o. 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 12/21/2021 Medical Record Number: [id number] Patient Account Number: [id number] Date of Birth/Sex: Treating RN: 12-Feb-1956 (66 y.o. 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 Facility Procedures CPT4 Code: 76720947 Description: G0277-(Facility Use Only) HBOT full body chamber, , ICD-10 Diagnosis Description [phone] Other chronic osteomyelitis, left ankle and foot 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 E11.621 Type 2 diabetes  mellitus with foot ulcer I25.10 Atherosclerotic heart disease of native coronary artery without angina pectoris Modifier: Quantity: 4 Physician Procedures : CPT4 Code Description Modifier [id number] - WC PHYS HYPERBARIC OXYGEN THERAPY ICD-10 Diagnosis Description M86.672 Other chronic osteomyelitis, left ankle and foot 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

## 2021-12-21 NOTE — Progress Notes (Addendum)
KINGSLEY, FARACE ([id number]) Visit Report for 12/21/2021 HBO Details Patient Name: Date of Service: NA [name] 12/21/2021 10:00 A M Medical Record Number: [id number] Patient Account Number: [id number] Date of Birth/Sex: Treating RN: [birth year] (66 y.o. [name] Primary Care Madoline Bhatt: [name] Other Clinician: [name] Referring [name]: Treating Daphnee Preiss/Extender: [name]: 16 HBO Treatment Course Details Treatment Course Number: 1 Ordering Orion Vandervort: [name] T Treatments Ordered: otal 80 HBO Treatment Start Date: 09/20/2021 HBO Indication: Diabetic Ulcer(s) of the Lower Extremity Standard/Conservative Wound Care tried and failed greater than or equal to 30 days HBO Treatment Details Treatment Number: 63 Patient Type: Outpatient Chamber Type: Monoplace Chamber Serial #: [id number] Treatment Protocol: 2.0 ATA with 90 minutes oxygen, with two 5 minute air breaks Treatment Details Compression Rate Down: 2.0 psi / minute De-Compression Rate Up: 2.0 psi / minute A breaks and breathing ir Compress Tx Pressure periods Decompress Decompress Begins Reached (leave unused spaces Begins Ends blank) Chamber Pressure (ATA 1 2 2 2 2 2  --2 1 ) Clock Time (24 hr) 09:47 09:59 10:30 10:35 11:05 11:10 - - 11:40 11:51 Treatment Length: 124 (minutes) Treatment Segments: 4 Vital Signs Capillary Blood Glucose Reference Range: 80 - 120 mg / dl HBO Diabetic Blood Glucose Intervention Range: <131 mg/dl or mg/dl Time Vitals Blood Respiratory Capillary Blood Glucose Pulse Action Type: Pulse: Temperature: Taken: Pressure: Rate: Glucose (mg/dl): Meter #: Oximetry (%) Taken: Pre 09:41 109/72 80 16 98.1 151 Post 11:55 114/75 72 18 97.7 136 Treatment Response Treatment Toleration: Well Treatment Completion Status: Treatment Completed without Adverse Event Additional Procedure Documentation Tissue Sevierity: Necrosis of  bone Physician HBO Attestation: I certify that I supervised this HBO treatment in accordance with Medicare guidelines. A trained emergency response team is readily available per Yes hospital policies and procedures. Continue HBOT as ordered. Yes Electronic Signature(s) Signed: 12/21/2021 4:28:10 PM By: 02/21/2022 MD FACS Previous Signature: 12/21/2021 1:39:22 PM Version By: 02/21/2022 EMT Entered By: [name] on 12/21/2021 16:28:09 -------------------------------------------------------------------------------- HBO Safety Checklist Details Patient Name: Date of Service: 02/21/2022 [name] 12/21/2021 10:00 A M Medical Record Number: 02/21/2022 Patient Account Number: [id number] Date of Birth/Sex: Treating RN: July 01, 1955 (66 y.o. 77 Primary Care [name]: [name] Other Clinician: [name] Referring [name]: Treating Alejandria Wessells/Extender: [name]: 16 HBO Safety Checklist Items Safety Checklist Consent Form Signed Patient voided / foley secured and emptied When did you last eato 0715 Last dose of injectable or oral agent 0715 Ostomy pouch emptied and vented if applicable NA All implantable devices assessed, documented and approved NA Intravenous access site secured and place NA Valuables secured Linens and cotton and cotton/polyester blend (less than 51% polyester) Personal oil-based products / skin lotions / body lotions removed Wigs or hairpieces removed NA Smoking or tobacco materials removed Books / newspapers / magazines / loose paper removed Cologne, aftershave, perfume and deodorant removed Jewelry removed (may wrap wedding band) NA Make-up removed NA Hair care products removed Battery operated devices (external) removed Heating patches and chemical warmers removed Titanium eyewear removed NA Nail polish cured greater than 10 hours NA Casting material cured greater than 10  hours NA Hearing aids removed NA Loose dentures or partials removed NA Prosthetics have been removed NA Patient demonstrates correct use of air break device (if applicable) Patient concerns have been addressed Patient grounding bracelet on and cord attached to chamber Specifics for Inpatients (complete  in addition to above) Medication sheet sent with patient NA Intravenous medications needed or due during therapy sent with patient NA Drainage tubes (e.g. nasogastric tube or chest tube secured and vented) NA Endotracheal or Tracheotomy tube secured NA Cuff deflated of air and inflated with saline NA Airway suctioned NA Notes The safety checklist was done before the treatment was started. Electronic Signature(s) Signed: 12/21/2021 1:36:51 PM By: [name] EMT Entered By: [name] on 12/21/2021 13:36:51

## 2021-12-21 NOTE — Progress Notes (Addendum)
ALDRED, MASE ([id number]) Visit Report for 12/21/2021 Arrival Information Details Patient Name: Date of Service: NA [name] 12/21/2021 10:00 A M Medical Record Number: [id number] Patient Account Number: [id number] Date of Birth/Sex: Treating RN: Aug 15, 1955 (66 y.o. [name] Primary Care Tacarra Justo: [name] Other Clinician: [name] Referring Juriel Cid: Treating Chelcey Caputo/Extender: [name] in Treatment: 16 Visit Information History Since Last Visit All ordered tests and consults were completed: Yes Patient Arrived: Dan Humphreys Added or deleted any medications: No Arrival Time: 09:26 Any new allergies or adverse reactions: No Accompanied By: None Had a fall or experienced change in No Transfer Assistance: None activities of daily living that may affect Patient Identification Verified: Yes risk of falls: Secondary Verification Process Completed: Yes Signs or symptoms of abuse/neglect since last visito No Patient Requires Transmission-Based Precautions: No Hospitalized since last visit: No Patient Has Alerts: Yes Implantable device outside of the clinic excluding No Patient Alerts: Patient on Blood Thinner cellular tissue based products placed in the center since last visit: Pain Present Now: No Electronic Signature(s) Signed: 12/21/2021 1:34:03 PM By: [name] EMT Entered By: [name] on 12/21/2021 13:34:02 -------------------------------------------------------------------------------- Encounter Discharge Information Details Patient Name: Date of Service: [name] 12/21/2021 10:00 A M Medical Record Number: [id number] Patient Account Number: [id number] Date of Birth/Sex: Treating RN: 03-17-1956 (65 y.o. [name] Primary Care Collyns Mcquigg: [name] Other Clinician: [name] Referring Macrae Wiegman: Treating Lurline Caver/Extender: [name] in Treatment: 16 Encounter Discharge  Information Items Discharge Condition: Stable Ambulatory Status: Walker Discharge Destination: Home Transportation: Private Auto Accompanied By: None Schedule Follow-up Appointment: Yes Clinical Summary of Care: Electronic Signature(s) Signed: 12/21/2021 1:41:42 PM By: [name] EMT Entered By: [name] on 12/21/2021 13:41:42 -------------------------------------------------------------------------------- Vitals Details Patient Name: Date of Service: NA RRO Colin Aris [name] 12/21/2021 10:00 A M Medical Record Number: [id number] Patient Account Number: [id number] Date of Birth/Sex: Treating RN: April 20, 1956 (66 y.o. [name] Primary Care Malakie Balis: [name] Other Clinician: [name] Referring Virna Livengood: Treating Adeeb Konecny/Extender: [name]: 16 Vital Signs Time Taken: 09:41 Temperature (F): 98.1 Height (in): 74 Pulse (bpm): 80 Weight (lbs): 186 Respiratory Rate (breaths/min): 16 Body Mass Index (BMI): 23.9 Blood Pressure (mmHg): 109/72 Capillary Blood Glucose (mg/dl): 741 Reference Range: 80 - 120 mg / dl Electronic Signature(s) Signed: 12/21/2021 1:34:57 PM By: [name] EMT Entered By: [name] on 12/21/2021 13:34:57

## 2021-12-22 ENCOUNTER — Encounter (HOSPITAL_BASED_OUTPATIENT_CLINIC_OR_DEPARTMENT_OTHER): Payer: BC Managed Care – PPO | Admitting: General Surgery

## 2021-12-22 DIAGNOSIS — K219 Gastro-esophageal reflux disease without esophagitis: Secondary | ICD-10-CM | POA: Diagnosis not present

## 2021-12-22 DIAGNOSIS — Z9181 History of falling: Secondary | ICD-10-CM | POA: Diagnosis not present

## 2021-12-22 DIAGNOSIS — L97324 Non-pressure chronic ulcer of left ankle with necrosis of bone: Secondary | ICD-10-CM | POA: Diagnosis not present

## 2021-12-22 DIAGNOSIS — I10 Essential (primary) hypertension: Secondary | ICD-10-CM | POA: Diagnosis not present

## 2021-12-22 DIAGNOSIS — Z7984 Long term (current) use of oral hypoglycemic drugs: Secondary | ICD-10-CM | POA: Diagnosis not present

## 2021-12-22 DIAGNOSIS — Z794 Long term (current) use of insulin: Secondary | ICD-10-CM | POA: Diagnosis not present

## 2021-12-22 DIAGNOSIS — I252 Old myocardial infarction: Secondary | ICD-10-CM | POA: Diagnosis not present

## 2021-12-22 DIAGNOSIS — E785 Hyperlipidemia, unspecified: Secondary | ICD-10-CM | POA: Diagnosis not present

## 2021-12-22 DIAGNOSIS — E1140 Type 2 diabetes mellitus with diabetic neuropathy, unspecified: Secondary | ICD-10-CM | POA: Diagnosis not present

## 2021-12-22 DIAGNOSIS — M86672 Other chronic osteomyelitis, left ankle and foot: Secondary | ICD-10-CM | POA: Diagnosis not present

## 2021-12-22 DIAGNOSIS — E11621 Type 2 diabetes mellitus with foot ulcer: Secondary | ICD-10-CM | POA: Diagnosis not present

## 2021-12-22 DIAGNOSIS — I2510 Atherosclerotic heart disease of native coronary artery without angina pectoris: Secondary | ICD-10-CM | POA: Diagnosis not present

## 2021-12-22 DIAGNOSIS — L97322 Non-pressure chronic ulcer of left ankle with fat layer exposed: Secondary | ICD-10-CM | POA: Diagnosis not present

## 2021-12-22 DIAGNOSIS — Z7982 Long term (current) use of aspirin: Secondary | ICD-10-CM | POA: Diagnosis not present

## 2021-12-22 DIAGNOSIS — M1990 Unspecified osteoarthritis, unspecified site: Secondary | ICD-10-CM | POA: Diagnosis not present

## 2021-12-22 DIAGNOSIS — D649 Anemia, unspecified: Secondary | ICD-10-CM | POA: Diagnosis not present

## 2021-12-22 DIAGNOSIS — T8131XA Disruption of external operation (surgical) wound, not elsewhere classified, initial encounter: Secondary | ICD-10-CM | POA: Diagnosis not present

## 2021-12-22 DIAGNOSIS — Z951 Presence of aortocoronary bypass graft: Secondary | ICD-10-CM | POA: Diagnosis not present

## 2021-12-22 DIAGNOSIS — E11622 Type 2 diabetes mellitus with other skin ulcer: Secondary | ICD-10-CM | POA: Diagnosis not present

## 2021-12-22 DIAGNOSIS — E11610 Type 2 diabetes mellitus with diabetic neuropathic arthropathy: Secondary | ICD-10-CM | POA: Diagnosis not present

## 2021-12-22 LAB — GLUCOSE, CAPILLARY
Glucose-Capillary: 194 mg/dL — ABNORMAL HIGH (ref 70–99)
Glucose-Capillary: 166 mg/dL — ABNORMAL HIGH (ref 70–99)

## 2021-12-22 NOTE — Progress Notes (Addendum)
JOHNTE, PORTNOY ([id number]) Visit Report for 12/22/2021 Arrival Information Details Patient Name: Date of Service: NA [name] 12/22/2021 10:00 A M Medical Record Number: [id number] Patient Account Number: [id number] Date of Birth/Sex: Treating RN: 21-Feb-1956 (66 y.o. [name] Primary Care [name]: [name] Other Clinician: [name] Referring [name]: Treating [name]/Extender: [name]: 17 Visit Information History Since Last Visit All ordered tests and consults were completed: Yes Patient Arrived: [name] Added or deleted any medications: No Arrival Time: 09:27 Any new allergies or adverse reactions: No Accompanied By: Wife Had a fall or experienced change in No Transfer Assistance: None activities of daily living that may affect Patient Identification Verified: Yes risk of falls: Secondary Verification Process Completed: Yes Signs or symptoms of abuse/neglect since last visito No Patient Requires Transmission-Based Precautions: No Hospitalized since last visit: No Patient Has Alerts: Yes Implantable device outside of the clinic excluding No Patient Alerts: Patient on Blood Thinner cellular tissue based products placed in the center since last visit: Pain Present Now: No Electronic Signature(s) Signed: 12/22/2021 11:46:53 AM By: [name] EMT Entered By: [name] on 12/22/2021 11:46:52 -------------------------------------------------------------------------------- Encounter Discharge Information Details Patient Name: Date of Service: NA [name] A. 12/22/2021 10:00 A M Medical Record Number: [id number] Patient Account Number: [id number] Date of Birth/Sex: Treating RN: 05-28-1956 (66 y.o. [name] Primary Care [name]: [name] Other Clinician: [name] Referring [name]: Treating [name]/Extender: [name]: 17 Encounter  Discharge Information Items Discharge Condition: Stable Ambulatory Status: Walker Discharge Destination: Home Transportation: Private Auto Accompanied By: Wife Schedule Follow-up Appointment: Yes Clinical Summary of Care: Electronic Signature(s) Signed: 12/22/2021 1:11:48 PM By: [name] EMT Entered By: [name] on 12/22/2021 13:11:48 -------------------------------------------------------------------------------- Vitals Details Patient Name: Date of Service: NA Colin [name] 12/22/2021 10:00 A M Medical Record Number: [id number] Patient Account Number: [id number] Date of Birth/Sex: Treating RN: 13-Sep-1955 (66 y.o. [name] Primary Care Damya Comley: [name] Other Clinician: [name] Referring [name]: Treating [name]/Extender: [name]: 17 Vital Signs Time Taken: 09:41 Temperature (F): 97.7 Height (in): 74 Pulse (bpm): 75 Weight (lbs): 186 Respiratory Rate (breaths/min): 16 Body Mass Index (BMI): 23.9 Blood Pressure (mmHg): 102/62 Capillary Blood Glucose (mg/dl): 932 Reference Range: 80 - 120 mg / dl Electronic Signature(s) Signed: 12/22/2021 11:47:23 AM By: [name] EMT Entered By: [name] on 12/22/2021 11:47:23

## 2021-12-22 NOTE — Progress Notes (Addendum)
Colin, [name] ([id number]) Visit Report for 12/22/2021 HBO Details Patient Name: Date of Service: NA Colin Nephew A. 12/22/2021 10:00 A M Medical Record Number: [id number] Patient Account Number: [id number] Date of Birth/Sex: Treating RN: 05-Jan-1956 (66 y.o. [name] Primary Care Debany Vantol: [name] Other Clinician: [name] Referring Nikolaus Pienta: Treating Brayen Bunn/Extender: [name]: 17 HBO Treatment Course Details Treatment Course Number: 1 Ordering Yalissa Fink: [name] T Treatments Ordered: otal 80 HBO Treatment Start Date: 09/20/2021 HBO Indication: Diabetic Ulcer(s) of the Lower Extremity Standard/Conservative Wound Care tried and failed greater than or equal to 30 days HBO Treatment Details Treatment Number: 64 Patient Type: Outpatient Chamber Type: Monoplace Chamber Serial #: [id number] Treatment Protocol: 2.0 ATA with 90 minutes oxygen, with two 5 minute air breaks Treatment Details Compression Rate Down: 2.0 psi / minute De-Compression Rate Up: 2.0 psi / minute A breaks and breathing ir Compress Tx Pressure periods Decompress Decompress Begins Reached (leave unused spaces Begins Ends blank) Chamber Pressure (ATA 1 2 2 2 2 2  --2 1 ) Clock Time (24 hr) 10:05 10:13 10:43 10:48 11:18 11:23 - - 11:53 12:05 Treatment Length: 120 (minutes) Treatment Segments: 4 Vital Signs Capillary Blood Glucose Reference Range: 80 - 120 mg / dl HBO Diabetic Blood Glucose Intervention Range: <131 mg/dl or mg/dl Time Vitals Blood Respiratory Capillary Blood Glucose Pulse Action Type: Pulse: Temperature: Taken: Pressure: Rate: Glucose (mg/dl): Meter #: Oximetry (%) Taken: Pre 09:41 102/62 75 16 97.7 194 Post 12:07 120/71 67 16 97.5 166 Treatment Response Treatment Toleration: Well Treatment Completion Status: Treatment Completed without Adverse Event Additional Procedure Documentation Tissue Sevierity: Necrosis of  bone Physician HBO Attestation: I certify that I supervised this HBO treatment in accordance with Medicare guidelines. A trained emergency response team is readily available per Yes hospital policies and procedures. Continue HBOT as ordered. Yes Electronic Signature(s) Signed: 12/22/2021 12:29:41 PM By: 02/22/2022 MD FACS Previous Signature: 12/22/2021 12:11:15 PM Version By: 02/22/2022 EMT Entered By: [name] on 12/22/2021 12:29:40 -------------------------------------------------------------------------------- HBO Safety Checklist Details Patient Name: Date of Service: 02/22/2022 A. 12/22/2021 10:00 A M Medical Record Number: 02/22/2022 Patient Account Number: [id number] Date of Birth/Sex: Treating RN: 1955/08/23 (66 y.o. 77 Primary Care [name]: [name] Other Clinician: [name] Referring Milas Schappell: Treating Rut Betterton/Extender: [name]: 17 HBO Safety Checklist Items Safety Checklist Consent Form Signed Patient voided / foley secured and emptied When did you last eato 0700 Last dose of injectable or oral agent 0700 Ostomy pouch emptied and vented if applicable NA All implantable devices assessed, documented and approved NA Intravenous access site secured and place NA Valuables secured Linens and cotton and cotton/polyester blend (less than 51% polyester) Personal oil-based products / skin lotions / body lotions removed Wigs or hairpieces removed NA Smoking or tobacco materials removed Books / newspapers / magazines / loose paper removed Cologne, aftershave, perfume and deodorant removed Jewelry removed (may wrap wedding band) NA Make-up removed NA Hair care products removed Battery operated devices (external) removed Heating patches and chemical warmers removed Titanium eyewear removed NA Nail polish cured greater than 10 hours NA Casting material cured greater than 10  hours NA Hearing aids removed NA Loose dentures or partials removed NA Prosthetics have been removed NA Patient demonstrates correct use of air break device (if applicable) Patient concerns have been addressed Patient grounding bracelet on and cord attached to chamber Specifics for Inpatients (complete  in addition to above) Medication sheet sent with patient NA Intravenous medications needed or due during therapy sent with patient NA Drainage tubes (e.g. nasogastric tube or chest tube secured and vented) NA Endotracheal or Tracheotomy tube secured NA Cuff deflated of air and inflated with saline NA Airway suctioned NA Notes The safety checklist was done before the treatment was started. Electronic Signature(s) Signed: 12/22/2021 11:49:08 AM By: [name] EMT Entered By: [name] on 12/22/2021 11:49:08

## 2021-12-22 NOTE — Progress Notes (Signed)
COLSEN, MODI ([id number]) Visit Report for 12/22/2021 Problem List Details Patient Name: Date of Service: NA Colin Nephew A. 12/22/2021 10:00 A M Medical Record Number: [id number] Patient Account Number: [id number] Date of Birth/Sex: Treating RN: 01/18/1956 (66 y.o. 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 12/22/2021 Medical Record Number: [id number] Patient Account Number: [id number] Date of Birth/Sex: Treating RN: 31-Oct-1955 (66 y.o. 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 Facility Procedures CPT4 Code: 35361443 Description: G0277-(Facility Use Only) HBOT full body chamber, , ICD-10 Diagnosis Description [phone] Other chronic osteomyelitis, left ankle and foot 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 E11.621 Type 2 diabetes  mellitus with foot ulcer I25.10 Atherosclerotic heart disease of native coronary artery without angina pectoris Modifier: Quantity: 4 Physician Procedures : CPT4 Code Description Modifier [id number] - WC PHYS HYPERBARIC OXYGEN THERAPY ICD-10 Diagnosis Description M86.672 Other chronic osteomyelitis, left ankle and foot 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

## 2021-12-23 ENCOUNTER — Encounter (HOSPITAL_BASED_OUTPATIENT_CLINIC_OR_DEPARTMENT_OTHER): Payer: BC Managed Care – PPO | Admitting: General Surgery

## 2021-12-23 DIAGNOSIS — L97324 Non-pressure chronic ulcer of left ankle with necrosis of bone: Secondary | ICD-10-CM | POA: Diagnosis not present

## 2021-12-23 DIAGNOSIS — E11610 Type 2 diabetes mellitus with diabetic neuropathic arthropathy: Secondary | ICD-10-CM | POA: Diagnosis not present

## 2021-12-23 DIAGNOSIS — M86672 Other chronic osteomyelitis, left ankle and foot: Secondary | ICD-10-CM | POA: Diagnosis not present

## 2021-12-23 DIAGNOSIS — I2510 Atherosclerotic heart disease of native coronary artery without angina pectoris: Secondary | ICD-10-CM | POA: Diagnosis not present

## 2021-12-23 DIAGNOSIS — E11621 Type 2 diabetes mellitus with foot ulcer: Secondary | ICD-10-CM | POA: Diagnosis not present

## 2021-12-23 DIAGNOSIS — T8131XA Disruption of external operation (surgical) wound, not elsewhere classified, initial encounter: Secondary | ICD-10-CM | POA: Diagnosis not present

## 2021-12-23 LAB — GLUCOSE, CAPILLARY
Glucose-Capillary: 201 mg/dL — ABNORMAL HIGH (ref 70–99)
Glucose-Capillary: 164 mg/dL — ABNORMAL HIGH (ref 70–99)

## 2021-12-23 NOTE — Progress Notes (Signed)
[name], [name] ([id number]) Visit Report for 12/23/2021 Problem List Details Patient Name: Date of Service: NA [name] 12/23/2021 10:00 A M Medical Record Number: [id number] Patient Account Number: [id number] Date of Birth/Sex: Treating RN: 1956-03-03 (66 y.o. 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 12/23/2021 Medical Record Number: [id number] Patient Account Number: [id number] Date of Birth/Sex: Treating RN: 07-29-1955 (66 y.o. 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 Facility Procedures CPT4 Code: 98264158 Description: G0277-(Facility Use Only) HBOT full body chamber, , ICD-10 Diagnosis Description [phone] Other chronic osteomyelitis, left ankle and foot 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 E11.621 Type 2 diabetes  mellitus with foot ulcer I25.10 Atherosclerotic heart disease of native coronary artery without angina pectoris Modifier: Quantity: 4 Physician Procedures : CPT4 Code Description Modifier [id number] - WC PHYS HYPERBARIC OXYGEN THERAPY ICD-10 Diagnosis Description M86.672 Other chronic osteomyelitis, left ankle and foot 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

## 2021-12-23 NOTE — Progress Notes (Signed)
CICERO, NOY ([id number]) Visit Report for 12/23/2021 Arrival Information Details Patient Name: Date of Service: NA [name] 12/23/2021 10:00 A M Medical Record Number: [id number] Patient Account Number: [id number] Date of Birth/Sex: Treating RN: [birth year] (66 y.o. [name] Primary Care Reyanne Hussar: [name] Other Clinician: [name] Referring Peirce Deveney: Treating Sharmain Lastra/Extender: [name] in Treatment: 17 Visit Information History Since Last Visit All ordered tests and consults were completed: Yes Patient Arrived: Dan Humphreys Added or deleted any medications: No Arrival Time: 09:22 Any new allergies or adverse reactions: No Accompanied By: Wife Had a fall or experienced change in No Transfer Assistance: None activities of daily living that may affect Patient Identification Verified: Yes risk of falls: Secondary Verification Process Completed: Yes Signs or symptoms of abuse/neglect since last visito No Patient Requires Transmission-Based Precautions: No Hospitalized since last visit: No Patient Has Alerts: Yes Implantable device outside of the clinic excluding No Patient Alerts: Patient on Blood Thinner cellular tissue based products placed in the center since last visit: Pain Present Now: No Electronic Signature(s) Signed: 12/23/2021 11:32:03 AM By: [name] EMT Entered By: [name] on 12/23/2021 11:32:02 -------------------------------------------------------------------------------- Encounter Discharge Information Details Patient Name: Date of Service: NA [name] 12/23/2021 10:00 A M Medical Record Number: [id number] Patient Account Number: [id number] Date of Birth/Sex: Treating RN: 07-01-1955 (66 y.o. [name] Primary Care Patra Gherardi: [name] Other Clinician: [name] Referring Olivine Hiers: Treating Tressie Ragin/Extender: [name] in Treatment: 17 Encounter  Discharge Information Items Discharge Condition: Stable Ambulatory Status: Walker Discharge Destination: Home Transportation: Private Auto Accompanied By: Wife Schedule Follow-up Appointment: Yes Clinical Summary of Care: Electronic Signature(s) Signed: 12/23/2021 12:06:45 PM By: [name] EMT Entered By: [name] on 12/23/2021 12:06:45 -------------------------------------------------------------------------------- Vitals Details Patient Name: Date of Service: NA [name], [name] 12/23/2021 10:00 A M Medical Record Number: [id number] Patient Account Number: [id number] Date of Birth/Sex: Treating RN: [birth year] (66 y.o. [name] Primary Care Ladarryl Wrage: [name] Other Clinician: [name] Referring Ikaika Showers: Treating Natanel Snavely/Extender: [name]: 17 Vital Signs Time Taken: 09:38 Temperature (F): 97.9 Height (in): 74 Pulse (bpm): 75 Weight (lbs): 186 Respiratory Rate (breaths/min): 16 Body Mass Index (BMI): 23.9 Blood Pressure (mmHg): 101/63 Capillary Blood Glucose (mg/dl): 124 Reference Range: 80 - 120 mg / dl Electronic Signature(s) Signed: 12/23/2021 11:32:30 AM By: [name] EMT Entered By: [name] on 12/23/2021 11:32:30

## 2021-12-23 NOTE — Progress Notes (Addendum)
[name], RIGHI ([id number]) Visit Report for 12/23/2021 HBO Details Patient Name: Date of Service: NA Colin Nephew A. 12/23/2021 10:00 A M Medical Record Number: [id number] Patient Account Number: [id number] Date of Birth/Sex: Treating RN: 1955/07/15 (66 y.o. [name] Primary Care Sunset Joshi: [name] Other Clinician: [name] Referring Tyra Michelle: Treating Davontae Prusinski/Extender: [name]: 17 HBO Treatment Course Details Treatment Course Number: 1 Ordering Christopher Glasscock: [name] T Treatments Ordered: otal 80 HBO Treatment Start Date: 09/20/2021 HBO Indication: Diabetic Ulcer(s) of the Lower Extremity Standard/Conservative Wound Care tried and failed greater than or equal to 30 days HBO Treatment Details Treatment Number: 65 Patient Type: Outpatient Chamber Type: Monoplace Chamber Serial #: [id number] Treatment Protocol: 2.0 ATA with 90 minutes oxygen, with two 5 minute air breaks Treatment Details Compression Rate Down: 2.0 psi / minute De-Compression Rate Up: 2.0 psi / minute A breaks and breathing ir Compress Tx Pressure periods Decompress Decompress Begins Reached (leave unused spaces Begins Ends blank) Chamber Pressure (ATA 1 2 2 2 2 2  --2 1 ) Clock Time (24 hr) 09:43 09:54 10:24 10:29 10:59 11:04 - - 11:34 11:47 Treatment Length: 124 (minutes) Treatment Segments: 4 Vital Signs Capillary Blood Glucose Reference Range: 80 - 120 mg / dl HBO Diabetic Blood Glucose Intervention Range: <131 mg/dl or mg/dl Time Vitals Blood Respiratory Capillary Blood Glucose Pulse Action Type: Pulse: Temperature: Taken: Pressure: Rate: Glucose (mg/dl): Meter #: Oximetry (%) Taken: Pre 09:38 101/63 75 16 97.9 201 Post 11:56 121/72 68 18 97.4 164 Treatment Response Treatment Toleration: Well Treatment Completion Status: Treatment Completed without Adverse Event Additional Procedure Documentation Tissue Sevierity: Necrosis  of bone Physician HBO Attestation: I certify that I supervised this HBO treatment in accordance with Medicare guidelines. A trained emergency response team is readily available per Yes hospital policies and procedures. Continue HBOT as ordered. Yes Electronic Signature(s) Signed: 12/23/2021 12:23:59 PM By: 12/25/2021 MD FACS Previous Signature: 12/23/2021 12:05:23 PM Version By: 12/25/2021 EMT Entered By: [name] on 12/23/2021 12:23:59 -------------------------------------------------------------------------------- HBO Safety Checklist Details Patient Name: Date of Service: 12/25/2021 [name] 12/23/2021 10:00 A M Medical Record Number: 12/25/2021 Patient Account Number: [id number] Date of Birth/Sex: Treating RN: [birth year] (66 y.o. 79 Primary Care [name]: [name] Other Clinician: [name] Referring Swayze Pries: Treating Jamica Woodyard/Extender: [name]: 17 HBO Safety Checklist Items Safety Checklist Consent Form Signed Patient voided / foley secured and emptied When did you last eato 0700 Last dose of injectable or oral agent 0700 Ostomy pouch emptied and vented if applicable NA All implantable devices assessed, documented and approved NA Intravenous access site secured and place NA Valuables secured Linens and cotton and cotton/polyester blend (less than 51% polyester) Personal oil-based products / skin lotions / body lotions removed Wigs or hairpieces removed NA Smoking or tobacco materials removed Books / newspapers / magazines / loose paper removed Cologne, aftershave, perfume and deodorant removed Jewelry removed (may wrap wedding band) NA Make-up removed NA Hair care products removed Battery operated devices (external) removed Heating patches and chemical warmers removed Titanium eyewear removed NA Nail polish cured greater than 10 hours NA Casting material cured greater than 10  hours NA Hearing aids removed NA Loose dentures or partials removed NA Prosthetics have been removed NA Patient demonstrates correct use of air break device (if applicable) Patient concerns have been addressed Patient grounding bracelet on and cord attached to chamber Specifics for Inpatients (complete  in addition to above) Medication sheet sent with patient NA Intravenous medications needed or due during therapy sent with patient NA Drainage tubes (e.g. nasogastric tube or chest tube secured and vented) NA Endotracheal or Tracheotomy tube secured NA Cuff deflated of air and inflated with saline NA Airway suctioned NA Notes The safety checklist was done before the treatment was started. Electronic Signature(s) Signed: 12/23/2021 11:33:38 AM By: [name] EMT Entered By: [name] on 12/23/2021 11:33:38

## 2021-12-24 ENCOUNTER — Encounter (HOSPITAL_BASED_OUTPATIENT_CLINIC_OR_DEPARTMENT_OTHER): Payer: BC Managed Care – PPO | Admitting: Internal Medicine

## 2021-12-24 DIAGNOSIS — K219 Gastro-esophageal reflux disease without esophagitis: Secondary | ICD-10-CM | POA: Diagnosis not present

## 2021-12-24 DIAGNOSIS — Z9181 History of falling: Secondary | ICD-10-CM | POA: Diagnosis not present

## 2021-12-24 DIAGNOSIS — E1140 Type 2 diabetes mellitus with diabetic neuropathy, unspecified: Secondary | ICD-10-CM | POA: Diagnosis not present

## 2021-12-24 DIAGNOSIS — E11621 Type 2 diabetes mellitus with foot ulcer: Secondary | ICD-10-CM | POA: Diagnosis not present

## 2021-12-24 DIAGNOSIS — I252 Old myocardial infarction: Secondary | ICD-10-CM | POA: Diagnosis not present

## 2021-12-24 DIAGNOSIS — Z951 Presence of aortocoronary bypass graft: Secondary | ICD-10-CM | POA: Diagnosis not present

## 2021-12-24 DIAGNOSIS — T8131XA Disruption of external operation (surgical) wound, not elsewhere classified, initial encounter: Secondary | ICD-10-CM | POA: Diagnosis not present

## 2021-12-24 DIAGNOSIS — L97324 Non-pressure chronic ulcer of left ankle with necrosis of bone: Secondary | ICD-10-CM | POA: Diagnosis not present

## 2021-12-24 DIAGNOSIS — E11610 Type 2 diabetes mellitus with diabetic neuropathic arthropathy: Secondary | ICD-10-CM | POA: Diagnosis not present

## 2021-12-24 DIAGNOSIS — Z7984 Long term (current) use of oral hypoglycemic drugs: Secondary | ICD-10-CM | POA: Diagnosis not present

## 2021-12-24 DIAGNOSIS — D649 Anemia, unspecified: Secondary | ICD-10-CM | POA: Diagnosis not present

## 2021-12-24 DIAGNOSIS — E11622 Type 2 diabetes mellitus with other skin ulcer: Secondary | ICD-10-CM | POA: Diagnosis not present

## 2021-12-24 DIAGNOSIS — E785 Hyperlipidemia, unspecified: Secondary | ICD-10-CM | POA: Diagnosis not present

## 2021-12-24 DIAGNOSIS — L97524 Non-pressure chronic ulcer of other part of left foot with necrosis of bone: Secondary | ICD-10-CM | POA: Diagnosis not present

## 2021-12-24 DIAGNOSIS — Z794 Long term (current) use of insulin: Secondary | ICD-10-CM | POA: Diagnosis not present

## 2021-12-24 DIAGNOSIS — M1990 Unspecified osteoarthritis, unspecified site: Secondary | ICD-10-CM | POA: Diagnosis not present

## 2021-12-24 DIAGNOSIS — I2510 Atherosclerotic heart disease of native coronary artery without angina pectoris: Secondary | ICD-10-CM | POA: Diagnosis not present

## 2021-12-24 DIAGNOSIS — L97322 Non-pressure chronic ulcer of left ankle with fat layer exposed: Secondary | ICD-10-CM | POA: Diagnosis not present

## 2021-12-24 DIAGNOSIS — I10 Essential (primary) hypertension: Secondary | ICD-10-CM | POA: Diagnosis not present

## 2021-12-24 DIAGNOSIS — M86672 Other chronic osteomyelitis, left ankle and foot: Secondary | ICD-10-CM | POA: Diagnosis not present

## 2021-12-24 DIAGNOSIS — Z7982 Long term (current) use of aspirin: Secondary | ICD-10-CM | POA: Diagnosis not present

## 2021-12-24 LAB — GLUCOSE, CAPILLARY
Glucose-Capillary: 220 mg/dL — ABNORMAL HIGH (ref 70–99)
Glucose-Capillary: 223 mg/dL — ABNORMAL HIGH (ref 70–99)

## 2021-12-24 NOTE — Progress Notes (Addendum)
[name], [name] ([id number]) Visit Report for 12/24/2021 HBO Details Patient Name: Date of Service: NA [name] 12/24/2021 10:00 A M Medical Record Number: [id number] Patient Account Number: [id number] Date of Birth/Sex: Treating RN: 1956/01/16 (66 y.o. [name] Primary Care Epifanio Labrador: [name] Other Clinician: [name] Referring Camari Wisham: Treating Kielyn Kardell/Extender: Phil Dopp in Treatment: 17 HBO Treatment Course Details Treatment Course Number: 1 Ordering Daquane Aguilar: [name] T Treatments Ordered: otal 80 HBO Treatment Start Date: 09/20/2021 HBO Indication: Diabetic Ulcer(s) of the Lower Extremity Standard/Conservative Wound Care tried and failed greater than or equal to 30 days HBO Treatment Details Treatment Number: 66 Patient Type: Outpatient Chamber Type: Monoplace Chamber Serial #: [id number] Treatment Protocol: 2.0 ATA with 90 minutes oxygen, with two 5 minute air breaks Treatment Details Compression Rate Down: 2.0 psi / minute De-Compression Rate Up: A breaks and breathing ir Compress Tx Pressure periods Decompress Decompress Begins Reached (leave unused spaces Begins Ends blank) Chamber Pressure (ATA 1 2 2 2 2 2  --2 1 ) Clock Time (24 hr) 09:49 10:02 10:32 10:37 11:07 11:12 - - 11:42 11:51 Treatment Length: 122 (minutes) Treatment Segments: 4 Vital Signs Capillary Blood Glucose Reference Range: 80 - 120 mg / dl HBO Diabetic Blood Glucose Intervention Range: <131 mg/dl or mg/dl Time Vitals Blood Respiratory Capillary Blood Glucose Pulse Action Type: Pulse: Temperature: Taken: Pressure: Rate: Glucose (mg/dl): Meter #: Oximetry (%) Taken: Pre 09:45 109/67 78 16 98.1 220 Post 11:54 138/82 67 16 97.5 223 Treatment Response Treatment Toleration: Well Treatment Completion Status: Treatment Completed without Adverse Event Additional Procedure Documentation Tissue Sevierity: Necrosis of bone Khloe Hunkele  Notes No concerns with treatment given. Patient was also seen for wound review Physician HBO Attestation: I certify that I supervised this HBO treatment in accordance with Medicare guidelines. A trained emergency response team is readily available per Yes hospital policies and procedures. Continue HBOT as ordered. Yes Electronic Signature(s) Signed: 12/24/2021 4:59:49 PM By: 12/26/2021 MD Previous Signature: 12/24/2021 12:13:52 PM Version By: 12/26/2021 EMT Previous Signature: 12/24/2021 11:21:17 AM Version By: 12/26/2021 EMT Entered By: [name] on 12/24/2021 16:58:55 -------------------------------------------------------------------------------- HBO Safety Checklist Details Patient Name: Date of Service: 12/26/2021 A. 12/24/2021 10:00 A M Medical Record Number: 12/26/2021 Patient Account Number: [id number] Date of Birth/Sex: Treating RN: Feb 23, 1956 (66 y.o. 79 Primary Care Draycen Leichter: [name] Other Clinician: [name] Referring Brinleigh Tew: Treating Sanaii Caporaso/Extender: [name] in Treatment: 17 HBO Safety Checklist Items Safety Checklist Consent Form Signed Patient voided / foley secured and emptied When did you last eato 0700 Last dose of injectable or oral agent 0700 Ostomy pouch emptied and vented if applicable NA All implantable devices assessed, documented and approved NA Intravenous access site secured and place NA Valuables secured Linens and cotton and cotton/polyester blend (less than 51% polyester) Personal oil-based products / skin lotions / body lotions removed Wigs or hairpieces removed NA Smoking or tobacco materials removed Books / newspapers / magazines / loose paper removed Cologne, aftershave, perfume and deodorant removed Jewelry removed (may wrap wedding band) NA Make-up removed NA Hair care products removed Battery operated devices (external) removed Heating patches and  chemical warmers removed Titanium eyewear removed NA Nail polish cured greater than 10 hours NA Casting material cured greater than 10 hours NA Hearing aids removed NA Loose dentures or partials removed NA Prosthetics have been removed NA Patient demonstrates correct use of air break device (if  applicable) Patient concerns have been addressed Patient grounding bracelet on and cord attached to chamber Specifics for Inpatients (complete in addition to above) Medication sheet sent with patient NA Intravenous medications needed or due during therapy sent with patient NA Drainage tubes (e.g. nasogastric tube or chest tube secured and vented) NA Endotracheal or Tracheotomy tube secured NA Cuff deflated of air and inflated with saline NA Airway suctioned NA Notes The safety checklist was done before the treatment was started. Electronic Signature(s) Signed: 12/24/2021 11:20:36 AM By: [name] EMT Entered By: [name] on 12/24/2021 11:20:35

## 2021-12-24 NOTE — Progress Notes (Signed)
MACALLAN, ORD ([id number]) Visit Report for 12/24/2021 Problem List Details Patient Name: Date of Service: NA Colin Nephew A. 12/24/2021 10:00 A M Medical Record Number: [id number] Patient Account Number: [id number] Date of Birth/Sex: Treating RN: 02-Jan-1956 (66 y.o. 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 12/24/2021 Medical Record Number: [id number] Patient Account Number: [id number] Date of Birth/Sex: Treating RN: 09-May-1956 (66 y.o. 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 Facility Procedures CPT4 Code: 75916384 Description: G0277-(Facility Use Only) HBOT full body chamber, , ICD-10 Diagnosis Description 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 M86.672 Other chronic osteomyelitis, left ankle and foot E11.621 Type 2 diabetes  mellitus with foot ulcer 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

## 2021-12-24 NOTE — Progress Notes (Signed)
[name], [name] ([id number]) Visit Report for 12/24/2021 Arrival Information Details Patient Name: Date of Service: NA [name] 12/24/2021 12:45 PM Medical Record Number: [id number] Patient Account Number: [id number] Date of Birth/Sex: Treating RN: 08/19/1955 (67 y.o. [name] Primary Care Alexandra Posadas: [name] Other Clinician: Referring Roxann Vierra: Treating Anjana Cheek/Extender: Winfred Leeds in Treatment: 29 Visit Information History Since Last Visit Added or deleted any medications: No Patient Arrived: Knee Scooter Any new allergies or adverse reactions: No Arrival Time: 12:46 Had a fall or experienced change in No Accompanied By: self activities of daily living that may affect Transfer Assistance: None risk of falls: Patient Identification Verified: Yes Signs or symptoms of abuse/neglect since last visito No Secondary Verification Process Completed: Yes Hospitalized since last visit: No Patient Requires Transmission-Based Precautions: No Implantable device outside of the clinic excluding No Patient Has Alerts: Yes cellular tissue based products placed in the center Patient Alerts: Patient on Blood Thinner since last visit: Has Dressing in Place as Prescribed: Yes Pain Present Now: No Electronic Signature(s) Signed: 12/24/2021 5:02:52 PM By: Adline Peals Entered By: Adline Peals on 12/24/2021 12:46:43 -------------------------------------------------------------------------------- Clinic Level of Care Assessment Details Patient Name: Date of Service: Colin Loveless A. 12/24/2021 12:45 PM Medical Record Number: [id number] Patient Account Number: [id number] Date of Birth/Sex: Treating RN: 1956/04/04 (66 y.o. [name] Primary Care Avion Kutzer: [name] Other Clinician: Referring Delayne Sanzo: Treating Emannuel Vise/Extender: Winfred Leeds in Treatment: 17 Clinic Level of Care Assessment Items TOOL 4  Quantity Score X- 1 0 Use when only an EandM is performed on FOLLOW-UP visit ASSESSMENTS - Nursing Assessment / Reassessment X- 1 10 Reassessment of Co-morbidities (includes updates in patient status) X- 1 5 Reassessment of Adherence to Treatment Plan ASSESSMENTS - Wound and Skin A ssessment / Reassessment X - Simple Wound Assessment / Reassessment - one wound 1 5 '[]'  - 0 Complex Wound Assessment / Reassessment - multiple wounds '[]'  - 0 Dermatologic / Skin Assessment (not related to wound area) ASSESSMENTS - Focused Assessment X- 1 5 Circumferential Edema Measurements - multi extremities '[]'  - 0 Nutritional Assessment / Counseling / Intervention X- 1 5 Lower Extremity Assessment (monofilament, tuning fork, pulses) '[]'  - 0 Peripheral Arterial Disease Assessment (using hand held doppler) ASSESSMENTS - Ostomy and/or Continence Assessment and Care '[]'  - 0 Incontinence Assessment and Management '[]'  - 0 Ostomy Care Assessment and Management (repouching, etc.) PROCESS - Coordination of Care X - Simple Patient / Family Education for ongoing care 1 15 '[]'  - 0 Complex (extensive) Patient / Family Education for ongoing care X- 1 10 Staff obtains Programmer, systems, Records, T Results / Process Orders est '[]'  - 0 Staff telephones HHA, Nursing Homes / Clarify orders / etc '[]'  - 0 Routine Transfer to another Facility (non-emergent condition) '[]'  - 0 Routine Hospital Admission (non-emergent condition) '[]'  - 0 New Admissions / Biomedical engineer / Ordering NPWT Apligraf, etc. , '[]'  - 0 Emergency Hospital Admission (emergent condition) X- 1 10 Simple Discharge Coordination '[]'  - 0 Complex (extensive) Discharge Coordination PROCESS - Special Needs '[]'  - 0 Pediatric / Minor Patient Management '[]'  - 0 Isolation Patient Management '[]'  - 0 Hearing / Language / Visual special needs '[]'  - 0 Assessment of Community assistance (transportation, D/C planning, etc.) '[]'  - 0 Additional assistance / Altered  mentation '[]'  - 0 Support Surface(s) Assessment (bed, cushion, seat, etc.) INTERVENTIONS - Wound Cleansing / Measurement X - Simple Wound Cleansing - one wound 1 5 '[]'  -  0 Complex Wound Cleansing - multiple wounds X- 1 5 Wound Imaging (photographs - any number of wounds) '[]'  - 0 Wound Tracing (instead of photographs) '[]'  - 0 Simple Wound Measurement - one wound '[]'  - 0 Complex Wound Measurement - multiple wounds INTERVENTIONS - Wound Dressings X - Small Wound Dressing one or multiple wounds 1 10 '[]'  - 0 Medium Wound Dressing one or multiple wounds '[]'  - 0 Large Wound Dressing one or multiple wounds '[]'  - 0 Application of Medications - topical '[]'  - 0 Application of Medications - injection INTERVENTIONS - Miscellaneous '[]'  - 0 External ear exam '[]'  - 0 Specimen Collection (cultures, biopsies, blood, body fluids, etc.) '[]'  - 0 Specimen(s) / Culture(s) sent or taken to Lab for analysis '[]'  - 0 Patient Transfer (multiple staff / Civil Service fast streamer / Similar devices) '[]'  - 0 Simple Staple / Suture removal (25 or less) '[]'  - 0 Complex Staple / Suture removal (26 or more) '[]'  - 0 Hypo / Hyperglycemic Management (close monitor of Blood Glucose) '[]'  - 0 Ankle / Brachial Index (ABI) - do not check if billed separately X- 1 5 Vital Signs Has the patient been seen at the hospital within the last three years: Yes Total Score: 90 Level Of Care: New/Established - Level 3 Electronic Signature(s) Signed: 12/24/2021 5:02:52 PM By: Adline Peals Entered By: Adline Peals on 12/24/2021 13:11:39 -------------------------------------------------------------------------------- Encounter Discharge Information Details Patient Name: Date of Service: NA [name] A. 12/24/2021 12:45 PM Medical Record Number: [id number] Patient Account Number: [id number] Date of Birth/Sex: Treating RN: [birth year] (66 y.o. [name] Primary Care Naiyah Klostermann: [name] Other Clinician: Referring  Danell Vazquez: Treating Mary-Ann Pennella/Extender: Winfred Leeds in Treatment: 17 Encounter Discharge Information Items Discharge Condition: Stable Ambulatory Status: Walker Discharge Destination: Home Transportation: Private Auto Accompanied By: self Schedule Follow-up Appointment: Yes Clinical Summary of Care: Patient Declined Electronic Signature(s) Signed: 12/24/2021 5:02:52 PM By: Adline Peals Entered By: Adline Peals on 12/24/2021 13:12:14 -------------------------------------------------------------------------------- Lower Extremity Assessment Details Patient Name: Date of Service: Colin Loveless A. 12/24/2021 12:45 PM Medical Record Number: [id number] Patient Account Number: [id number] Date of Birth/Sex: Treating RN: 15-Apr-1956 (66 y.o. [name] Primary Care Shakari Qazi: [name] Other Clinician: Referring Evelene Roussin: Treating Jamesha Ellsworth/Extender: Ladona Ridgel Weeks in Treatment: 17 Edema Assessment Assessed: Shirlyn Goltz: Yes] Patrice Paradise: No] Edema: [Left: Ye] [Right: s] Calf Left: Right: Point of Measurement: From Medial Instep 34 cm Ankle Left: Right: Point of Measurement: From Medial Instep 23 cm Vascular Assessment Pulses: Dorsalis Pedis Palpable: [Left:Yes] Electronic Signature(s) Signed: 12/24/2021 5:20:38 PM By: Deon Pilling RN, BSN Entered By: Deon Pilling on 12/24/2021 12:57:07 -------------------------------------------------------------------------------- Multi Wound Chart Details Patient Name: Date of Service: NA [name] A. 12/24/2021 12:45 PM Medical Record Number: [id number] Patient Account Number: [id number] Date of Birth/Sex: Treating RN: Mar 26, 1956 (66 y.o. [name] Primary Care Teandra Harlan: [name] Other Clinician: Referring Loletta Harper: Treating Bruce Churilla/Extender: Winfred Leeds in Treatment: 17 Vital Signs Height(in): 74 Capillary Blood Glucose(mg/dl): 223 Weight(lbs):  186 Pulse(bpm): 36 Body Mass Index(BMI): 23.9 Blood Pressure(mmHg): 138/82 Temperature(F): 97.5 Respiratory Rate(breaths/min): 16 Photos: [N/A:N/A] Left, Medial Foot N/A N/A Wound Location: Gradually Appeared N/A N/A Wounding Event: Diabetic Wound/Ulcer of the Lower N/A N/A Primary Etiology: Extremity Cataracts, Coronary Artery Disease, N/A N/A Comorbid History: Hypertension, Myocardial Infarction, Peripheral Arterial Disease, Type II Diabetes, Osteomyelitis, Neuropathy 02/22/2021 N/A N/A Date Acquired: 63 N/A N/A Weeks of Treatment: Open N/A N/A Wound Status: No N/A N/A Wound  Recurrence: 6.2x2.7x0.2 N/A N/A Measurements L x W x D (cm) 13.148 N/A N/A A (cm) : rea 2.63 N/A N/A Volume (cm) : 7.00% N/A N/A % Reduction in A rea: 81.40% N/A N/A % Reduction in Volume: 12 Position 1 (o'clock): 2.6 Maximum Distance 1 (cm): Yes N/A N/A Tunneling: Grade 3 N/A N/A Classification: Medium N/A N/A Exudate A mount: Serosanguineous N/A N/A Exudate Type: red, brown N/A N/A Exudate Color: Distinct, outline attached N/A N/A Wound Margin: Large (67-100%) N/A N/A Granulation A mount: Red, Hyper-granulation N/A N/A Granulation Quality: None Present (0%) N/A N/A Necrotic A mount: Fat Layer (Subcutaneous Tissue): Yes N/A N/A Exposed Structures: Bone: Yes Fascia: No Tendon: No Muscle: No Joint: No Small (1-33%) N/A N/A Epithelialization: Treatment Notes Wound #1 (Foot) Wound Laterality: Left, Medial Cleanser Wound Cleanser Discharge Instruction: Cleanse the wound with wound cleanser prior to applying a clean dressing using gauze sponges, not tissue or cotton balls. Peri-Wound Care Skin Prep Discharge Instruction: Use skin prep as directed Zinc Oxide Ointment 30g tube Discharge Instruction: Apply Zinc Oxide to periwound with each dressing change Topical Primary Dressing Promogran Prisma Matrix, 4.34 (sq in) (silver collagen) Discharge Instruction: Moisten  collagen with saline or hydrogel Secondary Dressing ABD Pad, 5x9 Discharge Instruction: In clinic. Home Health to apply wound vac. Woven Gauze Sponge, Non-Sterile 4x4 in Discharge Instruction: In clinic. Home Health to apply wound vac. Secured With [employer], 4.5x3.1 (in/yd) Discharge Instruction: In clinic. Home Health to apply wound vac. 70M Medipore H Soft Cloth Surgical T ape, 4 x 10 (in/yd) Discharge Instruction: In clinic. Home Health to apply wound vac. Compression Wrap Compression Stockings Add-Ons Electronic Signature(s) Signed: 12/24/2021 4:59:49 PM By: Linton Ham MD Signed: 12/24/2021 5:02:52 PM By: Adline Peals Entered By: Linton Ham on 12/24/2021 13:16:25 -------------------------------------------------------------------------------- Multi-Disciplinary Care Plan Details Patient Name: Date of Service: Colin Loveless A. 12/24/2021 12:45 PM Medical Record Number: [id number] Patient Account Number: [id number] Date of Birth/Sex: Treating RN: 05-Dec-1955 (66 y.o. [name] Primary Care Dierks Wach: [name] Other Clinician: Referring Makena Murdock: Treating Jassen Sarver/Extender: Winfred Leeds in Treatment: 17 Multidisciplinary Care Plan reviewed with physician Active Inactive HBO Nursing Diagnoses: Anxiety related to feelings of confinement associated with the hyperbaric oxygen chamber Anxiety related to knowledge deficit of hyperbaric oxygen therapy and treatment procedures Discomfort related to temperature and humidity changes inside hyperbaric chamber Potential for barotraumas to ears, sinuses, teeth, and lungs or cerebral gas embolism related to changes in atmospheric pressure inside hyperbaric oxygen chamber Potential for oxygen toxicity seizures related to delivery of 100% oxygen at an increased atmospheric pressure Potential for pulmonary oxygen toxicity related to delivery of 100% oxygen at an increased atmospheric  pressure Goals: Barotrauma will be prevented during HBO2 Date Initiated: 09/17/2021 T arget Resolution Date: 01/07/2022 Goal Status: Active Patient will tolerate the hyperbaric oxygen therapy treatment Date Initiated: 09/17/2021 T arget Resolution Date: 01/07/2022 Goal Status: Active Patient will tolerate the internal climate of the chamber Date Initiated: 09/17/2021 T arget Resolution Date: 01/07/2022 Goal Status: Active Patient/caregiver will verbalize understanding of HBO goals, rationale, procedures and potential hazards Date Initiated: 09/17/2021 T arget Resolution Date: 01/07/2022 Goal Status: Active Signs and symptoms of pulmonary oxygen toxicity will be recognized and promptly addressed Date Initiated: 09/17/2021 T arget Resolution Date: 01/07/2022 Goal Status: Active Signs and symptoms of seizure will be recognized and promptly addressed ; seizing patients will suffer no harm Date Initiated: 09/17/2021 T arget Resolution Date: 01/07/2022 Goal Status: Active Interventions: Administer decongestants,  per physician orders, prior to HBO2 Administer the correct therapeutic gas delivery based on the patients needs and limitations, per physician order Assess and provide for patients comfort related to the hyperbaric environment and equalization of middle ear Assess for signs and symptoms related to adverse events, including but not limited to confinement anxiety, pneumothorax, oxygen toxicity and baurotrauma Assess patient for any history of confinement anxiety Assess patient's knowledge and expectations regarding hyperbaric medicine and provide education related to the hyperbaric environment, goals of treatment and prevention of adverse events Implement protocols to decrease risk of pneumothorax in high risk patients Notes: Nutrition Nursing Diagnoses: Impaired glucose control: actual or potential Goals: Patient/caregiver verbalizes understanding of need to maintain therapeutic glucose  control per primary care physician Date Initiated: 08/25/2021 Target Resolution Date: 01/07/2022 Goal Status: Active Interventions: Assess HgA1c results as ordered upon admission and as needed Provide education on elevated blood sugars and impact on wound healing Notes: Osteomyelitis Nursing Diagnoses: Infection: osteomyelitis Knowledge deficit related to disease process and management Goals: Patient's osteomyelitis will resolve Date Initiated: 09/17/2021 Target Resolution Date: 01/07/2022 Goal Status: Active Interventions: Assess for signs and symptoms of osteomyelitis resolution every visit Provide education on osteomyelitis Treatment Activities: Surgical debridement : 09/17/2021 Systemic antibiotics : 09/17/2021 T ordered outside of clinic : 09/17/2021 est Notes: Wound/Skin Impairment Nursing Diagnoses: Impaired tissue integrity Goals: Patient/caregiver will verbalize understanding of skin care regimen Date Initiated: 08/25/2021 Target Resolution Date: 01/07/2022 Goal Status: Active Ulcer/skin breakdown will have a volume reduction of 30% by week 4 Date Initiated: 08/25/2021 Date Inactivated: 10/08/2021 Target Resolution Date: 10/20/2021 Goal Status: Met Interventions: Assess patient/caregiver ability to obtain necessary supplies Assess patient/caregiver ability to perform ulcer/skin care regimen upon admission and as needed Assess ulceration(s) every visit Provide education on ulcer and skin care Treatment Activities: Topical wound management initiated : 08/25/2021 Notes: Electronic Signature(s) Signed: 12/24/2021 5:02:52 PM By: Adline Peals Entered By: Adline Peals on 12/24/2021 12:58:15 -------------------------------------------------------------------------------- Pain Assessment Details Patient Name: Date of Service: Colin Loveless A. 12/24/2021 12:45 PM Medical Record Number: [id number] Patient Account Number: [id number] Date of Birth/Sex: Treating  RN: 04/20/1956 (66 y.o. [name] Primary Care Anniebell Bedore: [name] Other Clinician: Referring Mechele Kittleson: Treating Tasheka Houseman/Extender: Winfred Leeds in Treatment: 17 Active Problems Location of Pain Severity and Description of Pain Patient Has Paino No Site Locations Rate the pain. Current Pain Level: 0 Pain Management and Medication Current Pain Management: Electronic Signature(s) Signed: 12/24/2021 5:02:52 PM By: Adline Peals Entered By: Adline Peals on 12/24/2021 12:46:52 -------------------------------------------------------------------------------- Patient/Caregiver Education Details Patient Name: Date of Service: NA [name] A. 7/14/2023andnbsp12:45 PM Medical Record Number: [id number] Patient Account Number: [id number] Date of Birth/Gender: Treating RN: April 07, 1956 (66 y.o. [name] Primary Care Physician: [name] Other Clinician: Referring Physician: Treating Physician/Extender: Winfred Leeds in Treatment: 17 Education Assessment Education Provided To: Patient Education Topics Provided Elevated Blood Sugar/ Impact on Healing: Handouts: Elevated Blood Sugars: How Do They Affect Wound Healing Methods: Explain/Verbal Responses: Reinforcements needed Electronic Signature(s) Signed: 12/24/2021 5:02:52 PM By: Adline Peals Entered By: Adline Peals on 12/24/2021 12:58:27 -------------------------------------------------------------------------------- Wound Assessment Details Patient Name: Date of Service: NA [name] A. 12/24/2021 12:45 PM Medical Record Number: [id number] Patient Account Number: [id number] Date of Birth/Sex: Treating RN: [birth year] (66 y.o. [name] Primary Care Doralene Glanz: [name] Other Clinician: Referring Cliff Damiani: Treating Amanee Iacovelli/Extender: Ladona Ridgel Weeks in Treatment: 17 Wound Status Wound Number: 1 Primary  Diabetic Wound/Ulcer  of the Lower Extremity Etiology: Wound Location: Left, Medial Foot Wound Open Wounding Event: Gradually Appeared Status: Date Acquired: 02/22/2021 Comorbid Cataracts, Coronary Artery Disease, Hypertension, Myocardial Weeks Of Treatment: 17 History: Infarction, Peripheral Arterial Disease, Type II Diabetes, Clustered Wound: No Osteomyelitis, Neuropathy Photos Wound Measurements Length: (cm) 6.2 Width: (cm) 2.7 Depth: (cm) 0.2 Area: (cm) 13.148 Volume: (cm) 2.63 % Reduction in Area: 7% % Reduction in Volume: 81.4% Epithelialization: Small (1-33%) Tunneling: Yes Position (o'clock): 12 Maximum Distance: (cm) 2.6 Undermining: No Wound Description Classification: Grade 3 Wound Margin: Distinct, outline attached Exudate Amount: Medium Exudate Type: Serosanguineous Exudate Color: red, brown Foul Odor After Cleansing: No Slough/Fibrino No Wound Bed Granulation Amount: Large (67-100%) Exposed Structure Granulation Quality: Red, Hyper-granulation Fascia Exposed: No Necrotic Amount: None Present (0%) Fat Layer (Subcutaneous Tissue) Exposed: Yes Tendon Exposed: No Muscle Exposed: No Joint Exposed: No Bone Exposed: Yes Treatment Notes Wound #1 (Foot) Wound Laterality: Left, Medial Cleanser Wound Cleanser Discharge Instruction: Cleanse the wound with wound cleanser prior to applying a clean dressing using gauze sponges, not tissue or cotton balls. Peri-Wound Care Skin Prep Discharge Instruction: Use skin prep as directed Zinc Oxide Ointment 30g tube Discharge Instruction: Apply Zinc Oxide to periwound with each dressing change Topical Primary Dressing Promogran Prisma Matrix, 4.34 (sq in) (silver collagen) Discharge Instruction: Moisten collagen with saline or hydrogel Secondary Dressing ABD Pad, 5x9 Discharge Instruction: In clinic. Home Health to apply wound vac. Woven Gauze Sponge, Non-Sterile 4x4 in Discharge Instruction: In clinic. Home Health  to apply wound vac. Secured With [employer], 4.5x3.1 (in/yd) Discharge Instruction: In clinic. Home Health to apply wound vac. 42M Medipore H Soft Cloth Surgical T ape, 4 x 10 (in/yd) Discharge Instruction: In clinic. Home Health to apply wound vac. Compression Wrap Compression Stockings Add-Ons Electronic Signature(s) Signed: 12/24/2021 5:02:52 PM By: Adline Peals Signed: 12/24/2021 5:20:38 PM By: Deon Pilling RN, BSN Entered By: Deon Pilling on 12/24/2021 12:56:08 -------------------------------------------------------------------------------- Vitals Details Patient Name: Date of Service: NA [name] 12/24/2021 12:45 PM Medical Record Number: [id number] Patient Account Number: [id number] Date of Birth/Sex: Treating RN: April 06, 1956 (66 y.o. [name] Primary Care Kerington Hildebrant: [name] Other Clinician: Referring Zailyn Rowser: Treating Sibel Khurana/Extender: Winfred Leeds in Treatment: 17 Vital Signs Time Taken: 11:54 Temperature (F): 97.5 Height (in): 74 Pulse (bpm): 67 Weight (lbs): 186 Respiratory Rate (breaths/min): 16 Body Mass Index (BMI): 23.9 Blood Pressure (mmHg): 138/82 Capillary Blood Glucose (mg/dl): 223 Reference Range: 80 - 120 mg / dl Electronic Signature(s) Signed: 12/24/2021 5:02:52 PM By: Adline Peals Entered By: Adline Peals on 12/24/2021 12:48:08

## 2021-12-24 NOTE — Progress Notes (Signed)
[name], Colin A. ([id number]) Visit Report for 12/24/2021 HPI Details Patient Name: Date of Service: NA [name], [name] A. 12/24/2021 12:45 PM Medical Record Number: [id number] Patient Account Number: [id number] Date of Birth/Sex: Treating RN: [birth year] (66 y.o. [name]) Herrington, Taylor Primary Care Provider: Simone CuriaLee, Keung Other Clinician: Referring Provider: Treating Provider/Extender: Phil Doppobson, Leilanee Righetti Lee, Keung Weeks in Treatment: 17 History of Present Illness HPI Description: ADMISSION 08/25/2021 This is a 66 year old man who initially presented to his primary care provider in September 2022 with pain in his left foot. He was sent for an x-ray and while the x-ray was being performed, the tech pointed out a wound on his foot that the patient was not aware existed. He does have type 2 diabetes with significant neuropathy. His diabetes is suboptimally controlled with his most recent A1c being 8.5. He also has a history of coronary artery disease status post three- vessel CABG. he was initially seen by orthopedics, but they referred him to [facility]. He has undergone at least 7 operations/debridements and several applications of skin substitute under the care of podiatry. He has been in a wound VAC for much of this time. His most recent procedure was July 28, 2021. A portion of the talus was biopsied and was found to be consistent with osteomyelitis. Culture also returned positive for corynebacterium. He was seen on August 16, 2021 by infectious disease. A PICC line has been placed and he will be receiving a 6-week course of IV daptomycin and cefepime. In October 2022, he underwent lower extremity vascular studies. Results are copied here: Right: Resting right ankle-brachial index is within normal range. No evidence of significant right lower extremity arterial disease. The right toe-brachial index is abnormal. Left: Resting left ankle-brachial index indicates mild left  lower extremity arterial disease. The left toe-brachial index is abnormal. He has not been seen by vascular surgery despite these findings. He presented to clinic today in a cam boot and is using a knee scooter to offload. Wound VAC was in place. Once this was removed, a large ulcer was identified on the left midfoot/ankle. Bone is frankly exposed. There is no malodorous or purulent drainage. There is some granulation tissue over the central portion of the exposed bone. There is a tunnel that extends posteriorly for roughly 10 cm. It has been discussed with him by multiple providers that he is at very high risk of losing his lower leg because of this wound. He is extremely eager to avoid this outcome and is here today to review his options as well as receive ongoing wound care. 09/03/2021: Here for reevaluation of his wound. There does not appear to have been any substantial improvement overall since our last visit. He has been in a wound VAC with white foam overlying the exposed bone. We are working on getting him approved for hyperbaric oxygen therapy. 09/10/2021: We are in the process of getting him cleared to begin hyperbaric oxygen therapy. He still needs to obtain a chest x-ray. Although the wound measurements are roughly the same, I think the overall appearance of the wound is better. The exposed bone has a bit more granulation tissue covering it. He has not received a vascular surgery appointment to reevaluate his flow to the wound. 09/17/2021: He has been approved for hyperbaric oxygen therapy and completed his chest x-ray, which I reviewed and it appears normal. The tunnels at the 12 and 10:00 positions are smaller. There is more granulation tissue covering the exposed bone and  the undermining has decreased. He still has not received a vascular surgery appointment. 09/24/2021: He initiated hyperbaric oxygen therapy this week and is tolerating it well. He has an appointment with vascular surgery  coming up on May 16. The granulation tissue is covering more of the exposed bone and both tunnels are a bit smaller. 10/01/2021: He continues to tolerate hyperbaric oxygen therapy. He saw infectious disease and they are planning to pull his PICC line. He has been initiated on oral antibiotics (doxycycline and Augmentin). The wound looks about the same but the tunnels are a little bit smaller. The skin seems to be contracting somewhat around the exposed bone. 10/08/2021: The wound is still about the same size, but the tunnels continue to come in and the skin is contracting around the exposed bone. He continues to have some accumulation of necrotic material in the inferoposterior aspect of the wound as well as accumulation at the 12:00 tunnel area. 10/15/2021: The wound is smaller today. The tunnels continue to come in. There is less necrotic tissue present. He does have some periwound maceration. 10/22/2021: The wound is about the same size. There is a little bit less undermining at the distal portion. The exposed bone is dark and I am not sure if this is staining from silver nitrate or his VAC sponge or if it represents necrosis. The tunnels are shallower but he does have some serous drainage coming from the 10:00 tunnel. He continues to tolerate hyperbaric oxygen therapy well. 10/29/2021: The undermining continues to improve. The tunnels are about the same. He has good granulation tissue overlying the majority of the exposed bone. It does appear that perhaps the tubing from his wound VAC has been eroding the skin at the 12 clock position. He continues to accumulate senescent epithelium around the borders of the wound. 11/05/2021: The undermining is almost completely resolved. The tunnels have contracted fairly significantly. No significant slough or debris accumulation. There is still senescent epithelium accumulation around the borders of the wound. He has been tolerating hyperbaric oxygen therapy  well. 11/12/2021: Despite the measurements of the wound being about the same, the wound has changed in its shape and overall, I think it is improved. The undermining has resolved and the tunnels continue to shorten. There is good granulation tissue encroaching over the small area of bone that has remained exposed at the 12 o'clock position. Minimal slough accumulation. He continues to tolerate hyperbaric oxygen therapy well. 11/19/2021: I took a PCR culture last week. There was overgrowth of yeast. He is already taking suppressive doxycycline and Augmentin. I added fluconazole to his regimen. The wound is smaller again today. The tunnels continue to shorten. He continues to do well with hyperbaric oxygen therapy. 11/26/2021: For some reason, his foot has become macerated. The wound is narrower but about the same dimensions in its longitudinal aspect. The tunnels continue to shorten. He has some slough buildup on the wound as well as some heaped up senescent epithelium around the perimeter. 12/03/2021: No further maceration of his foot has occurred. The wound has contracted quite significantly from last week. The tunnel at 10:00 is closed. The tunnel at 12:00 is down to just a couple of millimeters. No other undermining is present. There is soft tissue coverage of the previously exposed bone. There is just a bit of slough and biofilm on the wound surface. 12/10/2021: The wound is looking good. It turns out the tunnel at 12:00 is only exposed when the patient dorsiflexes his foot. It is  about 2 cm in depth when he does this; when his foot is in plantarflexion, the tunnel is closed. The bone that was visible at the 12:00 tunnel is completely covered with granulation tissue, but there does feel like some exposed bone deeper into the tunnel area. There is senescent skin heaped up around the periphery. Minimal slough on the wound surface. 12/16/2021: The wound dimensions are roughly the same. The surface has nice  granulation tissue. The exposed bone at the 12:00 tunnel continues to be covered with more soft tissue. 7/14; patient's wound measures smaller today. Using the wound VAC with underlying collagen. He is also being treated with HBO for underlying osteomyelitis. He tells me he is on doxycycline and ampicillin follows with infectious disease next week Electronic Signature(s) Signed: 12/24/2021 4:59:49 PM By: Baltazar Najjar MD Entered By: Baltazar Najjar on 12/24/2021 13:17:09 -------------------------------------------------------------------------------- Physical Exam Details Patient Name: Date of Service: NA Colin Nephew A. 12/24/2021 12:45 PM Medical Record Number: [id number] Patient Account Number: [id number] Date of Birth/Sex: Treating RN: 1955-08-28 (66 y.o. [name] Primary Care Provider: [name] Other Clinician: Referring Provider: Treating Provider/Extender: Phil Dopp in Treatment: 17 Constitutional Sitting or standing Blood Pressure is within target range for patient.. Pulse regular and within target range for patient.Marland Kitchen Respirations regular, non-labored and within target range.. Temperature is normal and within the target range for the patient.Marland Kitchen Appears in no distress. Notes Wound exam; dimensions are measuring smaller. Granulation looks healthy. He does have a 2 cm tunnel in the superior part of the wound but this does not probe to bone no evidence of active infection. There is some nonpitting edema around the wound which I think was secondary to an improperly placed Ace wrap. Electronic Signature(s) Signed: 12/24/2021 4:59:49 PM By: Baltazar Najjar MD Entered By: Baltazar Najjar on 12/24/2021 13:18:11 -------------------------------------------------------------------------------- Physician Orders Details Patient Name: Date of Service: NA Colin Nephew A. 12/24/2021 12:45 PM Medical Record Number: [id number] Patient Account Number:  [id number] Date of Birth/Sex: Treating RN: 08/20/1955 (66 y.o. 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 - Dr. Lady Gary - Room 2 Return A Bathing/ Shower/ Hygiene May shower with protection but do not get wound dressing(s) wet. Negative Presssure Wound Therapy Wound Vac to wound continuously at 145mm/hg pressure Black Foam White Foam - Apply white foam or saline moistened gauze to tunnel at 10:00 and 12:00 Other: - Apply silver collagen on wound bed, under black foam Home Health No change in wound care orders this week; continue Home Health for wound care. May utilize formulary equivalent dressing for wound treatment orders unless otherwise specified. Dressing changes to be completed by Home Health on Monday / Wednesday / Friday except when patient has scheduled visit at [facility]. Other Home Health Orders/Instructions: [name]: Change vac 3x week Mon. Wed. and Fri. after wound care visit. Hyperbaric Oxygen Therapy Evaluate for HBO Therapy Indication: - Wagner 3 diabetic ulcer left foot If appropriate for treatment, begin HBOT per protocol: 2.5 ATA for 90 Minutes  with 2 Five (5) Minute A Breaks ir Total Number of Treatments: - 11/05/2021 additional 40 treatments. T of 80 otal One treatments per day (delivered Monday through Friday unless otherwise specified in Special Instructions below): Finger  stick Blood Glucose Pre- and Post- HBOT Treatment. Follow Hyperbaric Oxygen Glycemia Protocol A frin (Oxymetazoline HCL) 0.05% nasal spray - 1 spray in both nostrils daily as needed prior to HBO treatment for difficulty clearing ears Wound Treatment Wound #1 - Foot Wound Laterality: Left, Medial Cleanser: Wound Cleanser 3 x Per Week/30 Days Discharge Instructions: Cleanse the wound with wound cleanser prior to applying a clean dressing using gauze sponges, not tissue or cotton balls. Peri-Wound Care: Skin Prep 3 x Per Week/30 Days Discharge Instructions: Use skin prep as directed Peri-Wound Care: Zinc Oxide Ointment 30g tube 3 x Per Week/30 Days Discharge Instructions: Apply Zinc Oxide to periwound with each dressing change Prim Dressing: Promogran Prisma Matrix, 4.34 (sq in) (silver collagen) 3 x Per Week/30 Days ary Discharge Instructions: Moisten collagen with saline or hydrogel Secondary Dressing: ABD Pad, 5x9 3 x Per Week/30 Days Discharge Instructions: In clinic. Home Health to apply wound vac. Secondary Dressing: Woven Gauze Sponge, Non-Sterile 4x4 in 3 x Per Week/30 Days Discharge Instructions: In clinic. Home Health to apply wound vac. Secured With: [employer], 4.5x3.1 (in/yd) 3 x Per Week/30 Days Discharge Instructions: In clinic. Home Health to apply wound vac. Secured With: 4M Medipore H Soft Cloth Surgical T ape, 4 x 10 (in/yd) 3 x Per Week/30 Days Discharge Instructions: In clinic. Home Health to apply wound vac. GLYCEMIA INTERVENTIONS PROTOCOL PRE-HBO GLYCEMIA INTERVENTIONS ACTION INTERVENTION Obtain pre-HBO capillary blood glucose (ensure 1 physician order is in chart). A. Notify HBO physician and await physician orders. 2 If result is 70 mg/dl or below: B. If the result meets the hospital definition of a critical result, follow hospital policy. A. Give patient an 8 ounce Glucerna Shake, an 8 ounce Ensure, or 8 ounces of a Glucerna/Ensure equivalent  dietary supplement*. B. Wait 30 minutes. If result is 71 mg/dl to 967 mg/dl: C. Retest patients capillary blood glucose (CBG). D. If result greater than or equal to 110 mg/dl, proceed with HBO. If result less than 110 mg/dl, notify HBO physician and consider holding HBO. If result is 131 mg/dl to 591 mg/dl: A. Proceed with HBO. A. Notify HBO physician and await physician orders. B. It is recommended to hold HBO and do If result is 250 mg/dl or greater: blood/urine ketone testing. C. If the result meets the hospital definition of a critical result, follow hospital policy. POST-HBO GLYCEMIA INTERVENTIONS ACTION INTERVENTION Obtain post HBO capillary blood glucose (ensure 1 physician order is in chart). A. Notify HBO physician and await physician orders. 2 If result is 70 mg/dl or below: B. If the result meets the hospital definition of a critical result, follow hospital policy. A. Give patient an 8 ounce Glucerna Shake, an 8 ounce Ensure, or 8 ounces of a Glucerna/Ensure equivalent dietary supplement*. B. Wait 15 minutes for symptoms of If result is 71 mg/dl to 638 mg/dl: hypoglycemia (i.e. nervousness, anxiety, sweating, chills, clamminess, irritability, confusion, tachycardia or dizziness). C. If patient asymptomatic, discharge patient. If patient symptomatic, repeat capillary blood glucose (CBG) and notify HBO physician. If result is 101 mg/dl to 466 mg/dl: A. Discharge patient. A. Notify HBO physician and await physician orders. B. It is recommended to do blood/urine ketone If result is 250 mg/dl or greater: testing. C. If the result meets the  hospital definition of a critical result, follow hospital policy. *Juice or candies are NOT equivalent products. If patient refuses the Glucerna or Ensure, please consult the hospital dietitian for an appropriate substitute. Electronic Signature(s) Signed: 12/24/2021 4:59:49 PM By: Baltazar Najjar MD Signed: 12/24/2021 5:02:52  PM By: Samuella Bruin Entered By: Samuella Bruin on 12/24/2021 13:03:21 -------------------------------------------------------------------------------- Problem List Details Patient Name: Date of Service: NA Colin Nephew A. 12/24/2021 12:45 PM Medical Record Number: [id number] Patient Account Number: [id number] Date of Birth/Sex: Treating RN: 1955-09-13 (66 y.o. 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 12/24/2021 12:45 PM Medical Record Number: [id number] Patient Account Number: [id number] Date of Birth/Sex: Treating RN: 1956/01/22 (66 y.o. [name] Primary Care Provider: [name] Other Clinician: Referring Provider: Treating Provider/Extender: Phil Dopp in Treatment: 17 Subjective History of Present Illness (HPI) ADMISSION 08/25/2021 This is a 66 year old man who initially presented to his primary care provider in September 2022 with pain in his left foot. He was sent for an x-ray and while the x-ray  was being performed, the tech pointed out a wound on his foot that the patient was not aware existed. He does have type 2 diabetes with significant neuropathy. His diabetes is suboptimally controlled with his most recent A1c being 8.5. He also has a history of coronary artery disease status post three- vessel CABG. he was initially seen by orthopedics, but they referred him to [facility]. He has undergone at least 7 operations/debridements and several applications of skin substitute under the care of podiatry. He has been in a wound VAC for much of this time. His most recent procedure was July 28, 2021. A portion of the talus was biopsied and was found to be consistent with osteomyelitis. Culture also returned positive for corynebacterium. He was seen on August 16, 2021 by infectious disease. A PICC line has been placed and he will be receiving a 6-week course of IV daptomycin and cefepime. In October 2022, he underwent lower extremity vascular studies. Results are copied here: Right: Resting right ankle-brachial index is within normal range. No evidence of significant right lower extremity arterial disease. The right toe-brachial index is abnormal. Left: Resting left ankle-brachial index indicates mild left lower extremity arterial disease. The left toe-brachial index is abnormal. He has not been seen by vascular surgery despite these findings. He presented to clinic today in a cam boot and is using a knee scooter to offload. Wound VAC was in place. Once this was removed, a large ulcer was identified on the left midfoot/ankle. Bone is frankly exposed. There is no malodorous or purulent drainage. There is some granulation tissue over the central portion of the exposed bone. There is a tunnel that extends posteriorly for roughly 10 cm. It has been  discussed with him by multiple providers that he is at very high risk of losing his lower leg because of this wound. He is extremely  eager to avoid this outcome and is here today to review his options as well as receive ongoing wound care. 09/03/2021: Here for reevaluation of his wound. There does not appear to have been any substantial improvement overall since our last visit. He has been in a wound VAC with white foam overlying the exposed bone. We are working on getting him approved for hyperbaric oxygen therapy. 09/10/2021: We are in the process of getting him cleared to begin hyperbaric oxygen therapy. He still needs to obtain a chest x-ray. Although the wound measurements are roughly the same, I think the overall appearance of the wound is better. The exposed bone has a bit more granulation tissue covering it. He has not received a vascular surgery appointment to reevaluate his flow to the wound. 09/17/2021: He has been approved for hyperbaric oxygen therapy and completed his chest x-ray, which I reviewed and it appears normal. The tunnels at the 12 and 10:00 positions are smaller. There is more granulation tissue covering the exposed bone and the undermining has decreased. He still has not received a vascular surgery appointment. 09/24/2021: He initiated hyperbaric oxygen therapy this week and is tolerating it well. He has an appointment with vascular surgery coming up on May 16. The granulation tissue is covering more of the exposed bone and both tunnels are a bit smaller. 10/01/2021: He continues to tolerate hyperbaric oxygen therapy. He saw infectious disease and they are planning to pull his PICC line. He has been initiated on oral antibiotics (doxycycline and Augmentin). The wound looks about the same but the tunnels are a little bit smaller. The skin seems to be contracting somewhat around the exposed bone. 10/08/2021: The wound is still about the same size, but the tunnels continue to come in and the skin is contracting around the exposed bone. He continues to have some accumulation of necrotic material in the  inferoposterior aspect of the wound as well as accumulation at the 12:00 tunnel area. 10/15/2021: The wound is smaller today. The tunnels continue to come in. There is less necrotic tissue present. He does have some periwound maceration. 10/22/2021: The wound is about the same size. There is a little bit less undermining at the distal portion. The exposed bone is dark and I am not sure if this is staining from silver nitrate or his VAC sponge or if it represents necrosis. The tunnels are shallower but he does have some serous drainage coming from the 10:00 tunnel. He continues to tolerate hyperbaric oxygen therapy well. 10/29/2021: The undermining continues to improve. The tunnels are about the same. He has good granulation tissue overlying the majority of the exposed bone. It does appear that perhaps the tubing from his wound VAC has been eroding the skin at the 12 clock position. He continues to accumulate senescent epithelium around the borders of the wound. 11/05/2021: The undermining is almost completely resolved. The tunnels have contracted fairly significantly. No significant slough or debris accumulation. There is still senescent epithelium accumulation around the borders of the wound. He has been tolerating hyperbaric oxygen therapy well. 11/12/2021: Despite the measurements of the wound being about the same, the wound has changed in its shape and overall, I think it is improved. The undermining has resolved and the tunnels continue to shorten. There is good granulation tissue encroaching over the small area of bone that  has remained exposed at the 12 o'clock position. Minimal slough accumulation. He continues to tolerate hyperbaric oxygen therapy well. 11/19/2021: I took a PCR culture last week. There was overgrowth of yeast. He is already taking suppressive doxycycline and Augmentin. I added fluconazole to his regimen. The wound is smaller again today. The tunnels continue to shorten. He continues to  do well with hyperbaric oxygen therapy. 11/26/2021: For some reason, his foot has become macerated. The wound is narrower but about the same dimensions in its longitudinal aspect. The tunnels continue to shorten. He has some slough buildup on the wound as well as some heaped up senescent epithelium around the perimeter. 12/03/2021: No further maceration of his foot has occurred. The wound has contracted quite significantly from last week. The tunnel at 10:00 is closed. The tunnel at 12:00 is down to just a couple of millimeters. No other undermining is present. There is soft tissue coverage of the previously exposed bone. There is just a bit of slough and biofilm on the wound surface. 12/10/2021: The wound is looking good. It turns out the tunnel at 12:00 is only exposed when the patient dorsiflexes his foot. It is about 2 cm in depth when he does this; when his foot is in plantarflexion, the tunnel is closed. The bone that was visible at the 12:00 tunnel is completely covered with granulation tissue, but there does feel like some exposed bone deeper into the tunnel area. There is senescent skin heaped up around the periphery. Minimal slough on the wound surface. 12/16/2021: The wound dimensions are roughly the same. The surface has nice granulation tissue. The exposed bone at the 12:00 tunnel continues to be covered with more soft tissue. 7/14; patient's wound measures smaller today. Using the wound VAC with underlying collagen. He is also being treated with HBO for underlying osteomyelitis. He tells me he is on doxycycline and ampicillin follows with infectious disease next week Objective Constitutional Sitting or standing Blood Pressure is within target range for patient.. Pulse regular and within target range for patient.Marland Kitchen Respirations regular, non-labored and within target range.. Temperature is normal and within the target range for the patient.Marland Kitchen Appears in no distress. Vitals Time Taken: 11:54  AM, Height: 74 in, Weight: 186 lbs, BMI: 23.9, Temperature: 97.5 F, Pulse: 67 bpm, Respiratory Rate: 16 breaths/min, Blood Pressure: 138/82 mmHg, Capillary Blood Glucose: 223 mg/dl. General Notes: Wound exam; dimensions are measuring smaller. Granulation looks healthy. He does have a 2 cm tunnel in the superior part of the wound but this does not probe to bone no evidence of active infection. There is some nonpitting edema around the wound which I think was secondary to an improperly placed Ace wrap. Integumentary (Hair, Skin) Wound #1 status is Open. Original cause of wound was Gradually Appeared. The date acquired was: 02/22/2021. The wound has been in treatment 17 weeks. The wound is located on the Left,Medial Foot. The wound measures 6.2cm length x 2.7cm width x 0.2cm depth; 13.148cm^2 area and 2.63cm^3 volume. There is bone and Fat Layer (Subcutaneous Tissue) exposed. There is no undermining noted, however, there is tunneling at 12:00 with a maximum distance of 2.6cm. There is a medium amount of serosanguineous drainage noted. The wound margin is distinct with the outline attached to the wound base. There is large (67- 100%) red, hyper - granulation within the wound bed. There is no necrotic tissue within the wound bed. 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 - Dr. Lady Gary - Room 2 Bathing/ Shower/ Hygiene: May shower with protection but do not get wound dressing(s) wet. Negative Presssure Wound Therapy: Wound Vac to wound continuously at 158mm/hg pressure Black Foam White Foam - Apply white foam or saline moistened gauze to tunnel at 10:00  and 12:00 Other: - Apply silver collagen on wound bed, under black foam Home Health: No change in wound care orders this week; continue Home Health for wound care. May utilize formulary equivalent dressing for wound treatment orders unless otherwise specified. Dressing changes to be completed by Home Health on Monday / Wednesday / Friday except when patient has scheduled visit at [facility]. Other Home Health Orders/Instructions: [name]: Change vac 3x week Mon. Wed. and Fri. after wound care visit. Hyperbaric Oxygen Therapy: Evaluate for HBO Therapy Indication: - Wagner 3 diabetic ulcer left foot If appropriate for treatment, begin HBOT per protocol: 2.5 ATA for 90 Minutes with 2 Five (5) Minute Air Breaks T Number of Treatments: - 11/05/2021 additional 40 treatments. T of 80 otal otal One treatments per day (delivered Monday through Friday unless otherwise specified in Special Instructions below): Finger stick Blood Glucose Pre- and Post- HBOT Treatment. Follow Hyperbaric Oxygen Glycemia Protocol Afrin (Oxymetazoline HCL) 0.05% nasal spray - 1 spray in both nostrils daily as needed prior to HBO treatment for difficulty clearing ears WOUND #1: - Foot Wound Laterality: Left, Medial Cleanser: Wound Cleanser 3 x Per Week/30 Days Discharge Instructions: Cleanse the wound with wound cleanser prior to applying a clean dressing using gauze sponges, not tissue or cotton balls. Peri-Wound Care: Skin Prep 3 x Per Week/30 Days Discharge Instructions: Use skin prep as directed Peri-Wound Care: Zinc Oxide Ointment 30g tube 3 x Per Week/30 Days Discharge Instructions: Apply Zinc Oxide to periwound with each dressing change Prim Dressing: Promogran Prisma Matrix, 4.34 (sq in) (silver collagen) 3 x Per Week/30 Days ary Discharge Instructions: Moisten collagen with saline or hydrogel Secondary Dressing: ABD Pad, 5x9 3 x Per Week/30 Days Discharge Instructions: In clinic. Home Health to apply  wound vac. Secondary Dressing: Woven Gauze Sponge, Non-Sterile 4x4 in 3 x Per Week/30 Days Discharge Instructions: In clinic. Home Health to apply wound vac. Secured With: [employer], 4.5x3.1 (in/yd) 3 x Per Week/30 Days Discharge Instructions: In clinic. Home Health to apply wound vac. Secured With: 77M Medipore H Soft Cloth Surgical T ape, 4 x 10 (in/yd) 3 x Per Week/30 Days Discharge Instructions: In clinic. Home Health to apply wound vac. 1. Still silver collagen under wound VAC. Measurements slightly better today. 2. Continues with HBO, antibiotics in conjunction with infectious disease who he follows up with next week Electronic Signature(s) Signed: 12/24/2021 4:59:49 PM By: Baltazar Najjar MD Entered By: Baltazar Najjar on 12/24/2021 13:18:51 -------------------------------------------------------------------------------- SuperBill Details Patient Name: Date of Service: NA Colin Nephew A. 12/24/2021 Medical Record Number: [id number] Patient Account Number: [id number] Date of Birth/Sex: Treating RN: 10-21-1955 (66 y.o. 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

## 2021-12-24 NOTE — Progress Notes (Addendum)
SCHON, ZEIDERS ([id number]) Visit Report for 12/24/2021 Arrival Information Details Patient Name: Date of Service: NA [name] 12/24/2021 10:00 A M Medical Record Number: [id number] Patient Account Number: [id number] Date of Birth/Sex: Treating RN: May 21, 1956 (66 y.o. [name] Primary Care Cieara Stierwalt: [name] Other Clinician: [name] Referring Nghia Mcentee: Treating Evea Sheek/Extender: Phil Dopp in Treatment: 17 Visit Information History Since Last Visit All ordered tests and consults were completed: Yes Patient Arrived: Dan Humphreys Added or deleted any medications: No Arrival Time: 09:20 Any new allergies or adverse reactions: No Accompanied By: Wife Had a fall or experienced change in No Transfer Assistance: None activities of daily living that may affect Patient Identification Verified: Yes risk of falls: Secondary Verification Process Completed: Yes Signs or symptoms of abuse/neglect since last visito No Patient Requires Transmission-Based Precautions: No Hospitalized since last visit: No Patient Has Alerts: Yes Implantable device outside of the clinic excluding No Patient Alerts: Patient on Blood Thinner cellular tissue based products placed in the center since last visit: Pain Present Now: No Electronic Signature(s) Signed: 12/24/2021 11:18:52 AM By: [name] EMT Entered By: [name] on 12/24/2021 11:18:52 -------------------------------------------------------------------------------- Encounter Discharge Information Details Patient Name: Date of Service: NA [name] 12/24/2021 10:00 A M Medical Record Number: [id number] Patient Account Number: [id number] Date of Birth/Sex: Treating RN: 1955/12/05 (66 y.o. [name] Primary Care Deston Bilyeu: [name] Other Clinician: [name] Referring Latalia Etzler: Treating Saivon Prowse/Extender: Phil Dopp in Treatment: 17 Encounter Discharge  Information Items Discharge Condition: Stable Ambulatory Status: Walker Discharge Destination: Home Transportation: Private Auto Accompanied By: Wife Schedule Follow-up Appointment: Yes Clinical Summary of Care: Electronic Signature(s) Signed: 12/24/2021 12:15:40 PM By: [name] EMT Entered By: [name] on 12/24/2021 12:15:40 -------------------------------------------------------------------------------- Vitals Details Patient Name: Date of Service: NA [name] 12/24/2021 10:00 A M Medical Record Number: [id number] Patient Account Number: [id number] Date of Birth/Sex: Treating RN: 24-Aug-1955 (66 y.o. [name] Primary Care TRUE Garciamartinez: [name] Other Clinician: [name] Referring Brynna Dobos: Treating Darria Corvera/Extender: Phil Dopp in Treatment: 17 Vital Signs Time Taken: 09:45 Temperature (F): 98.1 Height (in): 74 Pulse (bpm): 78 Weight (lbs): 186 Respiratory Rate (breaths/min): 16 Body Mass Index (BMI): 23.9 Blood Pressure (mmHg): 109/67 Capillary Blood Glucose (mg/dl): 735 Reference Range: 80 - 120 mg / dl Electronic Signature(s) Signed: 12/24/2021 11:19:28 AM By: [name] EMT Entered By: [name] on 12/24/2021 11:19:28

## 2021-12-25 DIAGNOSIS — T8131XA Disruption of external operation (surgical) wound, not elsewhere classified, initial encounter: Secondary | ICD-10-CM | POA: Diagnosis not present

## 2021-12-26 DIAGNOSIS — T8131XA Disruption of external operation (surgical) wound, not elsewhere classified, initial encounter: Secondary | ICD-10-CM | POA: Diagnosis not present

## 2021-12-27 ENCOUNTER — Encounter (HOSPITAL_BASED_OUTPATIENT_CLINIC_OR_DEPARTMENT_OTHER): Payer: BC Managed Care – PPO | Admitting: General Surgery

## 2021-12-27 DIAGNOSIS — L97322 Non-pressure chronic ulcer of left ankle with fat layer exposed: Secondary | ICD-10-CM | POA: Diagnosis not present

## 2021-12-27 DIAGNOSIS — M86672 Other chronic osteomyelitis, left ankle and foot: Secondary | ICD-10-CM | POA: Diagnosis not present

## 2021-12-27 DIAGNOSIS — Z794 Long term (current) use of insulin: Secondary | ICD-10-CM | POA: Diagnosis not present

## 2021-12-27 DIAGNOSIS — I252 Old myocardial infarction: Secondary | ICD-10-CM | POA: Diagnosis not present

## 2021-12-27 DIAGNOSIS — E11622 Type 2 diabetes mellitus with other skin ulcer: Secondary | ICD-10-CM | POA: Diagnosis not present

## 2021-12-27 DIAGNOSIS — E1140 Type 2 diabetes mellitus with diabetic neuropathy, unspecified: Secondary | ICD-10-CM | POA: Diagnosis not present

## 2021-12-27 DIAGNOSIS — I10 Essential (primary) hypertension: Secondary | ICD-10-CM | POA: Diagnosis not present

## 2021-12-27 DIAGNOSIS — Z7982 Long term (current) use of aspirin: Secondary | ICD-10-CM | POA: Diagnosis not present

## 2021-12-27 DIAGNOSIS — D649 Anemia, unspecified: Secondary | ICD-10-CM | POA: Diagnosis not present

## 2021-12-27 DIAGNOSIS — E11621 Type 2 diabetes mellitus with foot ulcer: Secondary | ICD-10-CM | POA: Diagnosis not present

## 2021-12-27 DIAGNOSIS — I2510 Atherosclerotic heart disease of native coronary artery without angina pectoris: Secondary | ICD-10-CM | POA: Diagnosis not present

## 2021-12-27 DIAGNOSIS — E11610 Type 2 diabetes mellitus with diabetic neuropathic arthropathy: Secondary | ICD-10-CM | POA: Diagnosis not present

## 2021-12-27 DIAGNOSIS — K219 Gastro-esophageal reflux disease without esophagitis: Secondary | ICD-10-CM | POA: Diagnosis not present

## 2021-12-27 DIAGNOSIS — Z7984 Long term (current) use of oral hypoglycemic drugs: Secondary | ICD-10-CM | POA: Diagnosis not present

## 2021-12-27 DIAGNOSIS — L97324 Non-pressure chronic ulcer of left ankle with necrosis of bone: Secondary | ICD-10-CM | POA: Diagnosis not present

## 2021-12-27 DIAGNOSIS — M1990 Unspecified osteoarthritis, unspecified site: Secondary | ICD-10-CM | POA: Diagnosis not present

## 2021-12-27 DIAGNOSIS — E785 Hyperlipidemia, unspecified: Secondary | ICD-10-CM | POA: Diagnosis not present

## 2021-12-27 DIAGNOSIS — Z9181 History of falling: Secondary | ICD-10-CM | POA: Diagnosis not present

## 2021-12-27 DIAGNOSIS — T8131XA Disruption of external operation (surgical) wound, not elsewhere classified, initial encounter: Secondary | ICD-10-CM | POA: Diagnosis not present

## 2021-12-27 DIAGNOSIS — Z951 Presence of aortocoronary bypass graft: Secondary | ICD-10-CM | POA: Diagnosis not present

## 2021-12-27 LAB — GLUCOSE, CAPILLARY
Glucose-Capillary: 128 mg/dL — ABNORMAL HIGH (ref 70–99)
Glucose-Capillary: 178 mg/dL — ABNORMAL HIGH (ref 70–99)

## 2021-12-27 NOTE — Progress Notes (Addendum)
HILLIS, MCPHATTER ([id number]) Visit Report for 12/27/2021 HBO Details Patient Name: Date of Service: NA Colin Nephew A. 12/27/2021 10:00 A M Medical Record Number: [id number] Patient Account Number: [id number] Date of Birth/Sex: Treating RN: Feb 18, 1956 (66 y.o. [name] Primary Care Cornell Bourbon: [name] Other Clinician: [name] Referring [name]: Treating [name]/Extender: [name] Weeks in Treatment: 17 HBO Treatment Course Details Treatment Course Number: 1 Ordering Margarette Vannatter: [name] T Treatments Ordered: otal 80 HBO Treatment Start Date: 09/20/2021 HBO Indication: Diabetic Ulcer(s) of the Lower Extremity Standard/Conservative Wound Care tried and failed greater than or equal to 30 days HBO Treatment Details Treatment Number: 67 Patient Type: Outpatient Chamber Type: Monoplace Chamber Serial #: [id number] Treatment Protocol: 2.0 ATA with 90 minutes oxygen, with two 5 minute air breaks Treatment Details Compression Rate Down: 2.0 psi / minute De-Compression Rate Up: A breaks and breathing ir Compress Tx Pressure periods Decompress Decompress Begins Reached (leave unused spaces Begins Ends blank) Chamber Pressure (ATA 1 2 2 2 2 2  --2 1 ) Clock Time (24 hr) 09:52 10:06 10:37 10:43 11:13 11:18 - - 11:48 11:59 Treatment Length: 127 (minutes) Treatment Segments: 4 Vital Signs Capillary Blood Glucose Reference Range: 80 - 120 mg / dl HBO Diabetic Blood Glucose Intervention Range: <131 mg/dl or mg/dl Type: Time Vitals Blood Pulse: Respiratory Capillary Blood Glucose Pulse Action Temperature: Taken: Pressure: Rate: Glucose (mg/dl): Meter #: Oximetry (%) Taken: Pre 09:28 152/86 70 16 97.5 128 Patient given 8 oz glucerna Post 12:03 143/92 66 16 97.3 178 Treatment Response Treatment Toleration: Well Treatment Completion Status: Treatment Completed without Adverse Event Treatment Notes Spoke with Dr. >295 CBG. She stated  to give 8 oz Glucerna and start treatment. Additional Procedure Documentation Tissue Sevierity: Necrosis of muscle Physician HBO Attestation: I certify that I supervised this HBO treatment in accordance with Medicare guidelines. A trained emergency response team is readily available per Yes hospital policies and procedures. Continue HBOT as ordered. Yes Electronic Signature(s) Signed: 12/27/2021 2:30:40 PM By: 12/29/2021 MD FACS Previous Signature: 12/27/2021 12:47:21 PM Version By: 12/29/2021 EMT Previous Signature: 12/27/2021 10:55:32 AM Version By: 12/29/2021 EMT Previous Signature: 12/27/2021 10:54:35 AM Version By: 12/29/2021 EMT Previous Signature: 12/27/2021 10:54:49 AM Version By: 12/29/2021 MD FACS Entered By: [name] on 12/27/2021 14:30:40 -------------------------------------------------------------------------------- HBO Safety Checklist Details Patient Name: Date of Service: NA 12/29/2021 Colin A. 12/27/2021 10:00 A M Medical Record Number: 12/29/2021 Patient Account Number: [id number] Date of Birth/Sex: Treating RN: 1955/12/25 (66 y.o. 77 Primary Care [name]: [name] Other Clinician: [name] Referring [name]: Treating [name]/Extender: [name] Weeks in Treatment: 17 HBO Safety Checklist Items Safety Checklist Consent Form Signed Patient voided / foley secured and emptied When did you last eato 0645 Last dose of injectable or oral agent 0645 Ostomy pouch emptied and vented if applicable NA All implantable devices assessed, documented and approved NA Intravenous access site secured and place NA Valuables secured Linens and cotton and cotton/polyester blend (less than 51% polyester) Personal oil-based products / skin lotions / body lotions removed Wigs or hairpieces removed NA Smoking or tobacco materials removed Books / newspapers / magazines / loose paper removed Cologne, aftershave,  perfume and deodorant removed Jewelry removed (may wrap wedding band) Make-up removed NA Hair care products removed Battery operated devices (external) removed Heating patches and chemical warmers removed Titanium eyewear removed NA Nail polish cured greater than 10 hours NA Casting material  cured greater than 10 hours NA Hearing aids removed NA Loose dentures or partials removed NA Prosthetics have been removed NA Patient demonstrates correct use of air break device (if applicable) Patient concerns have been addressed Patient grounding bracelet on and cord attached to chamber Specifics for Inpatients (complete in addition to above) Medication sheet sent with patient NA Intravenous medications needed or due during therapy sent with patient NA Drainage tubes (e.g. nasogastric tube or chest tube secured and vented) NA Endotracheal or Tracheotomy tube secured NA Cuff deflated of air and inflated with saline NA Airway suctioned NA Notes The safety checklist was done before the treatment was started. Electronic Signature(s) Signed: 12/27/2021 10:48:52 AM By: [name] EMT Entered By: [name] on 12/27/2021 10:48:52

## 2021-12-27 NOTE — Progress Notes (Signed)
ALOYSUIS, RIBAUDO ([id number]) Visit Report for 12/27/2021 Problem List Details Patient Name: Date of Service: NA [name] 12/27/2021 10:00 A M Medical Record Number: [id number] Patient Account Number: [id number] Date of Birth/Sex: Treating RN: Dec 16, 1955 (66 y.o. 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 12/27/2021 Medical Record Number: [id number] Patient Account Number: [id number] Date of Birth/Sex: Treating RN: Mar 01, 1956 (66 y.o. 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 Facility Procedures CPT4 Code: 83151761 Description: G0277-(Facility Use Only) HBOT full body chamber, , ICD-10 Diagnosis Description [phone] Other chronic osteomyelitis, left ankle and foot E11.610 Type 2 diabetes mellitus with diabetic neuropathic arthropathy E11.621 Type 2 diabetes  mellitus with foot ulcer I25.10 Atherosclerotic heart disease of native coronary artery without angina pectoris Modifier: Quantity: 4 Physician Procedures : CPT4 Code Description Modifier [id number] - WC PHYS HYPERBARIC OXYGEN THERAPY ICD-10 Diagnosis Description M86.672 Other chronic osteomyelitis, left ankle and foot E11.610 Type 2 diabetes mellitus with diabetic neuropathic arthropathy E11.621 Type 2  diabetes mellitus with foot ulcer I25.10 Atherosclerotic heart disease of native coronary artery without angina pectoris Quantity: 1 Electronic Signature(s) Signed: 12/27/2021 12:47:50 PM By: [name] EMT Signed: 12/27/2021 3:35:15 PM By: [name] MD FACS Entered By: [name] on 12/27/2021 12:47:49

## 2021-12-27 NOTE — Progress Notes (Addendum)
JAELYN, CLONINGER ([id number]) Visit Report for 12/27/2021 Arrival Information Details Patient Name: Date of Service: NA [name] 12/27/2021 10:00 A M Medical Record Number: [id number] Patient Account Number: [id number] Date of Birth/Sex: Treating RN: 08/13/1955 (66 y.o. [name] Primary Care Layna Roeper: [name] Other Clinician: [name] Referring Quantavis Obryant: Treating Mammie Meras/Extender: [name] in Treatment: 17 Visit Information History Since Last Visit All ordered tests and consults were completed: Yes Patient Arrived: Ambulatory Added or deleted any medications: No Arrival Time: 09:23 Any new allergies or adverse reactions: No Accompanied By: Wife Had a fall or experienced change in No Transfer Assistance: None activities of daily living that may affect Patient Identification Verified: Yes risk of falls: Secondary Verification Process Completed: Yes Signs or symptoms of abuse/neglect since last visito No Patient Requires Transmission-Based Precautions: No Hospitalized since last visit: No Patient Has Alerts: Yes Implantable device outside of the clinic excluding No Patient Alerts: Patient on Blood Thinner cellular tissue based products placed in the center since last visit: Pain Present Now: No Electronic Signature(s) Signed: 12/27/2021 10:41:10 AM By: [name] EMT Entered By: [name] on 12/27/2021 10:41:10 -------------------------------------------------------------------------------- Encounter Discharge Information Details Patient Name: Date of Service: NA [name] 12/27/2021 10:00 A M Medical Record Number: [id number] Patient Account Number: [id number] Date of Birth/Sex: Treating RN: 1955-06-22 (66 y.o. [name] Primary Care Alvaro Aungst: [name] Other Clinician: [name] Referring Rogina Schiano: Treating Vishal Sandlin/Extender: [name]: 17 Encounter  Discharge Information Items Discharge Condition: Stable Ambulatory Status: Knee Scooter Discharge Destination: Home Transportation: Private Auto Accompanied By: Wife Schedule Follow-up Appointment: Yes Clinical Summary of Care: Electronic Signature(s) Signed: 12/27/2021 1:11:01 PM By: [name] EMT Entered By: [name] on 12/27/2021 13:11:01 -------------------------------------------------------------------------------- Vitals Details Patient Name: Date of Service: NA [name] 12/27/2021 10:00 A M Medical Record Number: [id number] Patient Account Number: [id number] Date of Birth/Sex: Treating RN: 12-05-1955 (66 y.o. [name] Primary Care Kareli Hossain: [name] Other Clinician: [name] Referring Lamonda Noxon: Treating Brynn Mulgrew/Extender: [name]: 17 Vital Signs Time Taken: 09:28 Temperature (F): 97.5 Height (in): 74 Pulse (bpm): 70 Weight (lbs): 186 Respiratory Rate (breaths/min): 16 Body Mass Index (BMI): 23.9 Blood Pressure (mmHg): 152/86 Capillary Blood Glucose (mg/dl): 412 Reference Range: 80 - 120 mg / dl Electronic Signature(s) Signed: 12/27/2021 10:42:58 AM By: [name] EMT Entered By: [name] on 12/27/2021 10:42:58

## 2021-12-28 ENCOUNTER — Other Ambulatory Visit: Payer: Self-pay

## 2021-12-28 ENCOUNTER — Ambulatory Visit (INDEPENDENT_AMBULATORY_CARE_PROVIDER_SITE_OTHER): Payer: BC Managed Care – PPO | Admitting: Internal Medicine

## 2021-12-28 ENCOUNTER — Encounter: Payer: Self-pay | Admitting: Internal Medicine

## 2021-12-28 VITALS — BP 144/82 | HR 74 | Temp 97.5°F | Resp 16

## 2021-12-28 DIAGNOSIS — L97403 Non-pressure chronic ulcer of unspecified heel and midfoot with necrosis of muscle: Secondary | ICD-10-CM | POA: Diagnosis not present

## 2021-12-28 DIAGNOSIS — E08621 Diabetes mellitus due to underlying condition with foot ulcer: Secondary | ICD-10-CM | POA: Diagnosis not present

## 2021-12-28 DIAGNOSIS — I739 Peripheral vascular disease, unspecified: Secondary | ICD-10-CM | POA: Diagnosis not present

## 2021-12-28 DIAGNOSIS — M86472 Chronic osteomyelitis with draining sinus, left ankle and foot: Secondary | ICD-10-CM

## 2021-12-28 DIAGNOSIS — T8131XA Disruption of external operation (surgical) wound, not elsewhere classified, initial encounter: Secondary | ICD-10-CM | POA: Diagnosis not present

## 2021-12-28 MED ORDER — AMOXICILLIN-POT CLAVULANATE 875-125 MG PO TABS
1.0000 | ORAL_TABLET | Freq: Two times a day (BID) | ORAL | 2 refills | Status: DC
Start: 2021-12-28 — End: 2022-04-14

## 2021-12-28 MED ORDER — DOXYCYCLINE HYCLATE 100 MG PO TABS: 100.0000 mg | ORAL_TABLET | Freq: Two times a day (BID) | ORAL | 2 refills | Status: DC

## 2021-12-28 NOTE — Progress Notes (Signed)
[facility]  CHIEF COMPLAINT:    Follow up for osteomyelitis  SUBJECTIVE:    [name] is a 66 y.o. male with PMHx as below who presents to the clinic for a diabetic foot wound complicated by osteomyelitis.  He was seen by myself as a new patient in March 2023 and most recently had a video visit on 11/03/21.  He previously underwent multiple surgeries of this wound with podiatry since October 2022.  He underwent debridement and irrigation of his wound down to the bone with application of skin graft substitute and wound VAC on 07/28/2021 with Dr Samuella Cota.  Pathology from his left talus bone was consistent with osteomyelitis and cultures were positive for diphtheroids.  Patient was prescribed doxycycline 100 mg twice daily which he took for approximately 2 weeks starting 08/02/2021.  He presented to [place] for further evaluation and antibiotic recommendations regarding his left foot infection in March and was prescribed broad coverage via PICC line with daptomycin and cefepime x 6 weeks which he completed.    He saw Dr Daiva Eves on 4/19 and he reported improvement in his wound despite inflammatory markers not changing much. He was transitioned to Augmentin and Doxycycline following IV therapy and continues on this suppressive regimen at this time. He is tolerating this well and reports no issues with the antibiotics.    He has been following diligently with wound care.  He saw them 12/24/21 where the wound was measured smaller that day.  He has a wound vac on today.  He reports the wound is getting smaller slowly and he is hopeful it will continue improve. He is tolerating antibiotics.  He has no fevers.    Please see A&P for the details of today's visit and status of the patient's medical problems.   Patient's Medications  New Prescriptions   No medications on file  Previous Medications   ACETAMINOPHEN (TYLENOL) 500 MG TABLET    Take 2 tablets (1,000 mg total)  by mouth every 6 (six) hours as needed for mild pain or fever.   ASPIRIN EC 81 MG TABLET    Take 81 mg by mouth daily.   ATORVASTATIN (LIPITOR) 80 MG TABLET    Take 80 mg by mouth daily.   CALCIUM CARBONATE (TUMS - DOSED IN MG ELEMENTAL CALCIUM) 500 MG CHEWABLE TABLET    Chew 1-2 tablets by mouth daily as needed for indigestion or heartburn.   IBUPROFEN (ADVIL) 200 MG TABLET    Take 400 mg by mouth every 8 (eight) hours as needed (pain.).   LANTUS 100 UNIT/ML INJECTION    Inject 30 Units into the skin 2 (two) times daily.   LISINOPRIL (ZESTRIL) 10 MG TABLET    Take 10 mg by mouth in the morning.   METFORMIN (GLUCOPHAGE) 850 MG TABLET    Take 850 mg by mouth in the morning, at noon, and at bedtime.   METOPROLOL SUCCINATE (TOPROL-XL) 50 MG 24 HR TABLET    Take 50 mg by mouth daily. Take with or immediately following a meal.   SITAGLIPTIN (JANUVIA) 100 MG TABLET    Take 100 mg by mouth every morning.    TETRAHYDROZOLINE HCL (VISINE OP)    Place 1 drop into both eyes daily as needed (redness).  Modified Medications   Modified Medication Previous Medication   AMOXICILLIN-CLAVULANATE (AUGMENTIN) 875-125 MG TABLET amoxicillin-clavulanate (AUGMENTIN) 875-125 MG tablet      Take 1 tablet by mouth  2 (two) times daily.    Take 1 tablet by mouth 2 (two) times daily.   DOXYCYCLINE (VIBRA-TABS) 100 MG TABLET doxycycline (VIBRA-TABS) 100 MG tablet      Take 1 tablet (100 mg total) by mouth 2 (two) times daily.    Take 1 tablet (100 mg total) by mouth 2 (two) times daily.  Discontinued Medications   No medications on file      Past Medical History:  Diagnosis Date   Coronary artery disease    Diabetes mellitus without complication (HCC)    Type II   GERD (gastroesophageal reflux disease)    Hypertension    Myocardial infarction (HCC)    "Silent"   Neuropathy     Social History   Tobacco Use   Smoking status: Never   Smokeless tobacco: Never  Vaping Use   Vaping Use: Never used  Substance Use  Topics   Alcohol use: Never   Drug use: Never    Family History  Problem Relation Age of Onset   Diabetes Mother    Diabetes Father    Heart disease Father    Diabetes Daughter     Allergies  Allergen Reactions   Codeine Sulfate [Codeine] Nausea Only    Review of Systems  All other systems reviewed and are negative.    OBJECTIVE:    Vitals:   12/28/21 1107  BP: (!) 144/82  Pulse: 74  Resp: 16  Temp: (!) 97.5 F (36.4 C)  TempSrc: Oral  SpO2: 100%   There is no height or weight on file to calculate BMI.  Physical Exam Constitutional:      General: He is not in acute distress.    Appearance: Normal appearance.  Pulmonary:     Effort: Pulmonary effort is normal. No respiratory distress.  Musculoskeletal:     Comments: Left foot wound vac in place.   Skin:    General: Skin is warm and dry.  Neurological:     General: No focal deficit present.     Mental Status: He is alert and oriented to person, place, and time.  Psychiatric:        Mood and Affect: Mood normal.        Behavior: Behavior normal.      Labs and Microbiology:    Latest Ref Rng & Units 08/16/2021    3:57 PM 07/28/2021   12:56 PM 07/02/2021    2:20 PM  CBC  WBC 3.8 - 10.8 Thousand/uL 8.6  7.6  8.7   Hemoglobin 13.2 - 17.1 g/dL 37.8  58.8  50.2   Hematocrit 38.5 - 50.0 % 38.3  39.3  39.3   Platelets 140 - 400 Thousand/uL 263  230  270       Latest Ref Rng & Units 08/16/2021    3:57 PM 07/28/2021   12:56 PM 07/06/2021    8:00 AM  CMP  Glucose 65 - 99 mg/dL 774  128  83   BUN 7 - 25 mg/dL 40  28  33   Creatinine 0.70 - 1.35 mg/dL 7.86  7.67  2.09   Sodium 135 - 146 mmol/L 138  135  139   Potassium 3.5 - 5.3 mmol/L 5.3  4.8  3.9   Chloride 98 - 110 mmol/L 101  105  105   CO2 20 - 32 mmol/L 24  26  28    Calcium 8.6 - 10.3 mg/dL 9.1  8.7  8.6   Total Protein 6.1 - 8.1 g/dL  6.2   6.5   Total Bilirubin 0.2 - 1.2 mg/dL 0.4   0.5   Alkaline Phos 38 - 126 U/L   72   AST 10 - 35 U/L 12   19    ALT 9 - 46 U/L 16   19        ASSESSMENT & PLAN:    Osteomyelitis (HCC)  Patient has completed 6 weeks of IV therapy (daptomycin/cefepime) in April 2023 and has now been on Augmentin and Doxycycline for approximately 3 months as a suppressive option.  This type of infection is typically best managed with a definitive BKA to cure but he is understandably not interested in this option right now.  He is following with wound care and will continue to do so.  His wound appears to be getting smaller and he reports improvement.  At this time, I am unsure whether further antibiotics are contributing much in terms of benefit and may be adding more risk.  We discussed this today and for now agreed to continue antibiotics given his overall improvement.  Will send in refills today, check labs, and follow up in 3 months.  Peripheral artery disease [facility]) Patient has follow up with vascular surgery in August 2023 for further evaluation.   Orders Placed This Encounter  Procedures   CBC   Basic metabolic panel    Order Specific Question:   Has the patient fasted?    Answer:   No   Sedimentation rate   C-reactive protein        Vedia Coffer for Infectious Disease [facility] 12/28/2021, 11:28 AM

## 2021-12-28 NOTE — Assessment & Plan Note (Signed)
Patient has follow up with vascular surgery in August 2023 for further evaluation.

## 2021-12-28 NOTE — Assessment & Plan Note (Signed)
  Patient has completed 6 weeks of IV therapy (daptomycin/cefepime) in April 2023 and has now been on Augmentin and Doxycycline for approximately 3 months as a suppressive option.  This type of infection is typically best managed with a definitive BKA to cure but he is understandably not interested in this option right now.  He is following with wound care and will continue to do so.  His wound appears to be getting smaller and he reports improvement.  At this time, I am unsure whether further antibiotics are contributing much in terms of benefit and may be adding more risk.  We discussed this today and for now agreed to continue antibiotics given his overall improvement.  Will send in refills today, check labs, and follow up in 3 months.

## 2021-12-29 ENCOUNTER — Encounter (HOSPITAL_BASED_OUTPATIENT_CLINIC_OR_DEPARTMENT_OTHER): Payer: BC Managed Care – PPO | Admitting: General Surgery

## 2021-12-29 DIAGNOSIS — Z951 Presence of aortocoronary bypass graft: Secondary | ICD-10-CM | POA: Diagnosis not present

## 2021-12-29 DIAGNOSIS — E11622 Type 2 diabetes mellitus with other skin ulcer: Secondary | ICD-10-CM | POA: Diagnosis not present

## 2021-12-29 DIAGNOSIS — Z794 Long term (current) use of insulin: Secondary | ICD-10-CM | POA: Diagnosis not present

## 2021-12-29 DIAGNOSIS — L97322 Non-pressure chronic ulcer of left ankle with fat layer exposed: Secondary | ICD-10-CM | POA: Diagnosis not present

## 2021-12-29 DIAGNOSIS — I252 Old myocardial infarction: Secondary | ICD-10-CM | POA: Diagnosis not present

## 2021-12-29 DIAGNOSIS — E1140 Type 2 diabetes mellitus with diabetic neuropathy, unspecified: Secondary | ICD-10-CM | POA: Diagnosis not present

## 2021-12-29 DIAGNOSIS — K219 Gastro-esophageal reflux disease without esophagitis: Secondary | ICD-10-CM | POA: Diagnosis not present

## 2021-12-29 DIAGNOSIS — Z7984 Long term (current) use of oral hypoglycemic drugs: Secondary | ICD-10-CM | POA: Diagnosis not present

## 2021-12-29 DIAGNOSIS — T8131XA Disruption of external operation (surgical) wound, not elsewhere classified, initial encounter: Secondary | ICD-10-CM | POA: Diagnosis not present

## 2021-12-29 DIAGNOSIS — I2510 Atherosclerotic heart disease of native coronary artery without angina pectoris: Secondary | ICD-10-CM | POA: Diagnosis not present

## 2021-12-29 DIAGNOSIS — M1990 Unspecified osteoarthritis, unspecified site: Secondary | ICD-10-CM | POA: Diagnosis not present

## 2021-12-29 DIAGNOSIS — I10 Essential (primary) hypertension: Secondary | ICD-10-CM | POA: Diagnosis not present

## 2021-12-29 DIAGNOSIS — D649 Anemia, unspecified: Secondary | ICD-10-CM | POA: Diagnosis not present

## 2021-12-29 DIAGNOSIS — L97324 Non-pressure chronic ulcer of left ankle with necrosis of bone: Secondary | ICD-10-CM | POA: Diagnosis not present

## 2021-12-29 DIAGNOSIS — E11610 Type 2 diabetes mellitus with diabetic neuropathic arthropathy: Secondary | ICD-10-CM | POA: Diagnosis not present

## 2021-12-29 DIAGNOSIS — E11621 Type 2 diabetes mellitus with foot ulcer: Secondary | ICD-10-CM | POA: Diagnosis not present

## 2021-12-29 DIAGNOSIS — Z9181 History of falling: Secondary | ICD-10-CM | POA: Diagnosis not present

## 2021-12-29 DIAGNOSIS — E785 Hyperlipidemia, unspecified: Secondary | ICD-10-CM | POA: Diagnosis not present

## 2021-12-29 DIAGNOSIS — M86672 Other chronic osteomyelitis, left ankle and foot: Secondary | ICD-10-CM | POA: Diagnosis not present

## 2021-12-29 DIAGNOSIS — Z7982 Long term (current) use of aspirin: Secondary | ICD-10-CM | POA: Diagnosis not present

## 2021-12-29 LAB — SEDIMENTATION RATE: Sed Rate: 34 mm/h — ABNORMAL HIGH (ref 0–20)

## 2021-12-29 LAB — CBC
HCT: 38.6 % (ref 38.5–50.0)
Hemoglobin: 12.6 g/dL — ABNORMAL LOW (ref 13.2–17.1)
MCH: 29.4 pg (ref 27.0–33.0)
MCHC: 32.6 g/dL (ref 32.0–36.0)
MCV: 90.2 fL (ref 80.0–100.0)
MPV: 11.2 fL (ref 7.5–12.5)
Platelets: 240 10*3/uL (ref 140–400)
RBC: 4.28 10*6/uL (ref 4.20–5.80)
RDW: 13.1 % (ref 11.0–15.0)
WBC: 7.2 10*3/uL (ref 3.8–10.8)

## 2021-12-29 LAB — BASIC METABOLIC PANEL
BUN: 19 mg/dL (ref 7–25)
CO2: 30 mmol/L (ref 20–32)
Calcium: 9.1 mg/dL (ref 8.6–10.3)
Chloride: 103 mmol/L (ref 98–110)
Creat: 0.84 mg/dL (ref 0.70–1.35)
Glucose, Bld: 217 mg/dL — ABNORMAL HIGH (ref 65–139)
Potassium: 5 mmol/L (ref 3.5–5.3)
Sodium: 137 mmol/L (ref 135–146)

## 2021-12-29 LAB — C-REACTIVE PROTEIN: CRP: 18.1 mg/L — ABNORMAL HIGH (ref <8.0)

## 2021-12-29 LAB — GLUCOSE, CAPILLARY
Glucose-Capillary: 218 mg/dL — ABNORMAL HIGH (ref 70–99)
Glucose-Capillary: 158 mg/dL — ABNORMAL HIGH (ref 70–99)

## 2021-12-29 NOTE — Progress Notes (Addendum)
Colin, [name] ([id number]) Visit Report for 12/29/2021 HBO Details Patient Name: Date of Service: NA [name] 12/29/2021 9:30 A M Medical Record Number: [id number] Patient Account Number: [id number] Date of Birth/Sex: Treating RN: 02/15/1956 (66 y.o. [name] Primary Care [name]: [name] Other Clinician: [name] Referring [name]: Treating [name]/Extender: [name]: 18 HBO Treatment Course Details Treatment Course Number: 1 Ordering Maryella Abood: [name] T Treatments Ordered: otal 80 HBO Treatment Start Date: 09/20/2021 HBO Indication: Diabetic Ulcer(s) of the Lower Extremity Standard/Conservative Wound Care tried and failed greater than or equal to 30 days HBO Treatment Details Treatment Number: 68 Patient Type: Outpatient Chamber Type: Monoplace Chamber Serial #: [id number] Treatment Protocol: 2.0 ATA with 90 minutes oxygen, with two 5 minute air breaks Treatment Details Compression Rate Down: 2.0 psi / minute De-Compression Rate Up: 2.0 psi / minute A breaks and breathing ir Compress Tx Pressure periods Decompress Decompress Begins Reached (leave unused spaces Begins Ends blank) Chamber Pressure (ATA 1 2 2 2 2 2  --2 1 ) Clock Time (24 hr) 10:09 10:22 10:52 10:58 11:28 11:33 - - 12:03 12:14 Treatment Length: 125 (minutes) Treatment Segments: 4 Vital Signs Capillary Blood Glucose Reference Range: 80 - 120 mg / dl HBO Diabetic Blood Glucose Intervention Range: <131 mg/dl or mg/dl Type: Time Vitals Blood Respiratory Capillary Blood Glucose Pulse Action Pulse: Temperature: Taken: Pressure: Rate: Glucose (mg/dl): Meter #: Oximetry (%) Taken: Pre 09:52 111/70 75 18 97.4 218 none per protocol Post 12:18 153/82 68 16 97.5 158 none per protocol Treatment Response Treatment Toleration: Well Treatment Completion Status: Treatment Completed without Adverse Event Additional Procedure  Documentation Tissue Sevierity: Necrosis of bone Physician HBO Attestation: I certify that I supervised this HBO treatment in accordance with Medicare guidelines. A trained emergency response team is readily available per Yes hospital policies and procedures. Continue HBOT as ordered. Yes Electronic Signature(s) Signed: 12/29/2021 2:49:13 PM By: 12/31/2021 MD FACS Signed: 01/05/2022 5:39:28 PM By: 01/07/2022 CHT EMT BS , , Previous Signature: 12/29/2021 12:31:48 PM Version By: 12/31/2021 MD FACS Previous Signature: 12/29/2021 10:44:01 AM Version By: 12/31/2021 CHT EMT BS , , Entered By: [name] on 12/29/2021 13:24:37 -------------------------------------------------------------------------------- HBO Safety Checklist Details Patient Name: Date of Service: 12/31/2021 A. 12/29/2021 9:30 A M Medical Record Number: 12/31/2021 Patient Account Number: [id number] Date of Birth/Sex: Treating RN: 1956/04/14 (66 y.o. 77 Primary Care [name]: [name] Other Clinician: [name] Referring [name]: Treating [name]/Extender: [name] Weeks in Treatment: 18 HBO Safety Checklist Items Safety Checklist Consent Form Signed Patient voided / foley secured and emptied When did you last eato 0710 Last dose of injectable or oral agent 0710 Ostomy pouch emptied and vented if applicable NA All implantable devices assessed, documented and approved NA Intravenous access site secured and place NA Valuables secured Linens and cotton and cotton/polyester blend (less than 51% polyester) Personal oil-based products / skin lotions / body lotions removed Wigs or hairpieces removed NA Smoking or tobacco materials removed NA Books / newspapers / magazines / loose paper removed Cologne, aftershave, perfume and deodorant removed Jewelry removed (may wrap wedding band) Make-up removed NA Hair care products  removed Battery operated devices (external) removed Heating patches and chemical warmers removed Titanium eyewear removed NA Nail polish cured greater than 10 hours NA Casting material cured greater than 10 hours NA Hearing aids removed NA Loose dentures or partials removed NA  Prosthetics have been removed NA Patient demonstrates correct use of air break device (if applicable) Patient concerns have been addressed Patient grounding bracelet on and cord attached to chamber Specifics for Inpatients (complete in addition to above) Medication sheet sent with patient NA Intravenous medications needed or due during therapy sent with patient NA Drainage tubes (e.g. nasogastric tube or chest tube secured and vented) NA Endotracheal or Tracheotomy tube secured NA Cuff deflated of air and inflated with saline NA Airway suctioned NA Notes Paper version used prior to treatment. Electronic Signature(s) Signed: 12/29/2021 10:43:35 AM By: [name] CHT EMT BS , , Entered By: [name] on 12/29/2021 10:43:34

## 2021-12-29 NOTE — Progress Notes (Addendum)
RONEY, YOUTZ ([id number]) Visit Report for 12/29/2021 Arrival Information Details Patient Name: Date of Service: NA [name] 12/29/2021 9:30 A M Medical Record Number: [id number] Patient Account Number: [id number] Date of Birth/Sex: Treating RN: 1955-07-24 (66 y.o. [name] Primary Care Mckaylin Bastien: [name] Other Clinician: [name] Referring Morty Ortwein: Treating Elyanna Wallick/Extender: [name] in Treatment: 18 Visit Information History Since Last Visit All ordered tests and consults were completed: Yes Patient Arrived: Knee Scooter Added or deleted any medications: No Arrival Time: 09:29 Any new allergies or adverse reactions: No Accompanied By: self Had a fall or experienced change in No Transfer Assistance: None activities of daily living that may affect Patient Identification Verified: Yes risk of falls: Secondary Verification Process Completed: Yes Signs or symptoms of abuse/neglect since last visito No Patient Requires Transmission-Based Precautions: No Hospitalized since last visit: No Patient Has Alerts: Yes Implantable device outside of the clinic excluding No Patient Alerts: Patient on Blood Thinner cellular tissue based products placed in the center since last visit: Pain Present Now: No Electronic Signature(s) Signed: 12/29/2021 10:41:45 AM By: [name] CHT EMT BS , , Entered By: [name] on 12/29/2021 10:41:45 -------------------------------------------------------------------------------- Encounter Discharge Information Details Patient Name: Date of Service: [name] 12/29/2021 9:30 A M Medical Record Number: [id number] Patient Account Number: [id number] Date of Birth/Sex: Treating RN: 1955/08/20 (66 y.o. [name] Primary Care Styles Fambro: [name] Other Clinician: [name] Referring Chrsitopher Wik: Treating Kamee Bobst/Extender: [name] in  Treatment: 18 Encounter Discharge Information Items Discharge Condition: Stable Ambulatory Status: Ambulatory Discharge Destination: Home Transportation: Private Auto Accompanied By: spouse Schedule Follow-up Appointment: No Clinical Summary of Care: Electronic Signature(s) Signed: 01/05/2022 5:39:28 PM By: [name] CHT EMT BS , , Entered By: [name] on 12/29/2021 13:34:11 -------------------------------------------------------------------------------- Vitals Details Patient Name: Date of Service: NA [name] 12/29/2021 9:30 A M Medical Record Number: [id number] Patient Account Number: [id number] Date of Birth/Sex: Treating RN: Mar 31, 1956 (66 y.o. [name] Primary Care Maclaine Ahola: [name] Other Clinician: [name] Referring Horace Wishon: Treating Romney Compean/Extender: [name]: 18 Vital Signs Time Taken: 09:52 Temperature (F): 97.4 Height (in): 74 Pulse (bpm): 75 Weight (lbs): 186 Respiratory Rate (breaths/min): 18 Body Mass Index (BMI): 23.9 Blood Pressure (mmHg): 111/70 Capillary Blood Glucose (mg/dl): 341 Reference Range: 80 - 120 mg / dl Electronic Signature(s) Signed: 12/29/2021 10:42:24 AM By: [name] CHT EMT BS , , Entered By: [name] on 12/29/2021 10:42:24

## 2021-12-30 ENCOUNTER — Encounter (HOSPITAL_BASED_OUTPATIENT_CLINIC_OR_DEPARTMENT_OTHER): Payer: BC Managed Care – PPO | Admitting: General Surgery

## 2021-12-30 DIAGNOSIS — I2510 Atherosclerotic heart disease of native coronary artery without angina pectoris: Secondary | ICD-10-CM | POA: Diagnosis not present

## 2021-12-30 DIAGNOSIS — E11621 Type 2 diabetes mellitus with foot ulcer: Secondary | ICD-10-CM | POA: Diagnosis not present

## 2021-12-30 DIAGNOSIS — L97324 Non-pressure chronic ulcer of left ankle with necrosis of bone: Secondary | ICD-10-CM | POA: Diagnosis not present

## 2021-12-30 DIAGNOSIS — Z794 Long term (current) use of insulin: Secondary | ICD-10-CM | POA: Diagnosis not present

## 2021-12-30 DIAGNOSIS — L97523 Non-pressure chronic ulcer of other part of left foot with necrosis of muscle: Secondary | ICD-10-CM | POA: Diagnosis not present

## 2021-12-30 DIAGNOSIS — E11610 Type 2 diabetes mellitus with diabetic neuropathic arthropathy: Secondary | ICD-10-CM | POA: Diagnosis not present

## 2021-12-30 DIAGNOSIS — T8131XA Disruption of external operation (surgical) wound, not elsewhere classified, initial encounter: Secondary | ICD-10-CM | POA: Diagnosis not present

## 2021-12-30 DIAGNOSIS — M86672 Other chronic osteomyelitis, left ankle and foot: Secondary | ICD-10-CM | POA: Diagnosis not present

## 2021-12-30 LAB — GLUCOSE, CAPILLARY
Glucose-Capillary: 222 mg/dL — ABNORMAL HIGH (ref 70–99)
Glucose-Capillary: 231 mg/dL — ABNORMAL HIGH (ref 70–99)

## 2021-12-30 NOTE — Progress Notes (Addendum)
[name], [name] ([id number]) Visit Report for 12/30/2021 Arrival Information Details Patient Name: Date of Service: NA [name] 12/30/2021 10:00 A M Medical Record Number: [id number] Patient Account Number: [id number] Date of Birth/Sex: Treating RN: October 02, 1955 (66 y.o. [name] Primary Care [name]: [name] Other Clinician: [name] Referring Yvan Dority: Treating Ofelia Podolski/Extender: [name] in Treatment: 18 Visit Information History Since Last Visit All ordered tests and consults were completed: Yes Patient Arrived: Ambulatory Added or deleted any medications: No Arrival Time: 09:25 Any new allergies or adverse reactions: No Accompanied By: Wife Had a fall or experienced change in No Transfer Assistance: None activities of daily living that may affect Patient Identification Verified: Yes risk of falls: Secondary Verification Process Completed: Yes Signs or symptoms of abuse/neglect since last visito No Patient Requires Transmission-Based Precautions: No Hospitalized since last visit: No Patient Has Alerts: Yes Implantable device outside of the clinic excluding No Patient Alerts: Patient on Blood Thinner cellular tissue based products placed in the center since last visit: Pain Present Now: No Electronic Signature(s) Signed: 12/30/2021 11:02:06 AM By: [name] EMT Entered By: [name] on 12/30/2021 11:02:05 -------------------------------------------------------------------------------- Encounter Discharge Information Details Patient Name: Date of Service: NA Colin Nephew A. 12/30/2021 10:00 A M Medical Record Number: [id number] Patient Account Number: [id number] Date of Birth/Sex: Treating RN: 06-23-1955 (66 y.o. [name] Primary Care Arin Vanosdol: [name] Other Clinician: [name] Referring [name]: Treating [name]/Extender: [name] in Treatment: 18 Encounter  Discharge Information Items Discharge Condition: Stable Ambulatory Status: Knee Scooter Discharge Destination: Home Transportation: Private Auto Accompanied By: Wife Schedule Follow-up Appointment: Yes Clinical Summary of Care: Electronic Signature(s) Signed: 12/30/2021 1:16:07 PM By: [name] EMT Entered By: [name] on 12/30/2021 13:16:07 -------------------------------------------------------------------------------- Vitals Details Patient Name: Date of Service: NA Colin [name] 12/30/2021 10:00 A M Medical Record Number: [id number] Patient Account Number: [id number] Date of Birth/Sex: Treating RN: Aug 04, 1955 (66 y.o. [name] Primary Care Corlene Sabia: [name] Other Clinician: [name] Referring [name]: Treating [name]/Extender: [name]: 18 Vital Signs Time Taken: 09:35 Temperature (F): 97.2 Height (in): 74 Pulse (bpm): 91 Weight (lbs): 186 Respiratory Rate (breaths/min): 16 Body Mass Index (BMI): 23.9 Blood Pressure (mmHg): 118/85 Capillary Blood Glucose (mg/dl): 655 Reference Range: 80 - 120 mg / dl Electronic Signature(s) Signed: 12/30/2021 11:02:41 AM By: [name] EMT Entered By: [name] on 12/30/2021 11:02:41

## 2021-12-30 NOTE — Progress Notes (Addendum)
[name], [name] ([id number]) Visit Report for 12/30/2021 HBO Details Patient Name: Date of Service: NA [name] 12/30/2021 10:00 A M Medical Record Number: [id number] Patient Account Number: [id number] Date of Birth/Sex: Treating RN: [birth year] (66 y.o. [name] Primary Care [name]: [name] Other Clinician: [name] Referring Adrien Dietzman: Treating Shelbi Vaccaro/Extender: [name]: 18 HBO Treatment Course Details Treatment Course Number: 1 Ordering Serafino Burciaga: [name] T Treatments Ordered: otal 80 HBO Treatment Start Date: 09/20/2021 HBO Indication: Diabetic Ulcer(s) of the Lower Extremity Standard/Conservative Wound Care tried and failed greater than or equal to 30 days HBO Treatment Details Treatment Number: 69 Patient Type: Outpatient Chamber Type: Monoplace Chamber Serial #: [id number] Treatment Protocol: 2.0 ATA with 90 minutes oxygen, with two 5 minute air breaks Treatment Details Compression Rate Down: 2.0 psi / minute De-Compression Rate Up: 2.0 psi / minute A breaks and breathing ir Compress Tx Pressure periods Decompress Decompress Begins Reached (leave unused spaces Begins Ends blank) Chamber Pressure (ATA 1 2 2 2 2 2  --2 1 ) Clock Time (24 hr) 09:45 10:01 10:31 10:36 11:06 11:11 - - 11:41 11:53 Treatment Length: 128 (minutes) Treatment Segments: 4 Vital Signs Capillary Blood Glucose Reference Range: 80 - 120 mg / dl HBO Diabetic Blood Glucose Intervention Range: <131 mg/dl or mg/dl Time Vitals Blood Respiratory Capillary Blood Glucose Pulse Action Type: Pulse: Temperature: Taken: Pressure: Rate: Glucose (mg/dl): Meter #: Oximetry (%) Taken: Pre 09:35 118/85 91 16 97.2 222 Post 11:55 141/90 77 16 97.3 231 Treatment Response Treatment Toleration: Well Treatment Completion Status: Treatment Completed without Adverse Event Additional Procedure Documentation Tissue Sevierity: Necrosis of  bone Physician HBO Attestation: I certify that I supervised this HBO treatment in accordance with Medicare guidelines. A trained emergency response team is readily available per Yes hospital policies and procedures. Continue HBOT as ordered. Yes Electronic Signature(s) Signed: 12/30/2021 4:24:07 PM By: 01/01/2022 MD FACS Previous Signature: 12/30/2021 1:12:37 PM Version By: 01/01/2022 EMT Previous Signature: 12/30/2021 11:12:49 AM Version By: 01/01/2022 EMT Entered By: [name] on 12/30/2021 16:24:07 -------------------------------------------------------------------------------- HBO Safety Checklist Details Patient Name: Date of Service: NA 01/01/2022 A. 12/30/2021 10:00 A M Medical Record Number: 01/01/2022 Patient Account Number: [id number] Date of Birth/Sex: Treating RN: 22-Jun-1955 (67 y.o. 77 Primary Care Nahara Dona: [name] Other Clinician: [name] Referring Yonas Bunda: Treating Gelsey Amyx/Extender: [name]: 18 HBO Safety Checklist Items Safety Checklist Consent Form Signed Patient voided / foley secured and emptied When did you last eato 0700 Last dose of injectable or oral agent 0700 Ostomy pouch emptied and vented if applicable NA All implantable devices assessed, documented and approved NA Intravenous access site secured and place NA Valuables secured Linens and cotton and cotton/polyester blend (less than 51% polyester) Personal oil-based products / skin lotions / body lotions removed Wigs or hairpieces removed NA Smoking or tobacco materials removed Books / newspapers / magazines / loose paper removed Cologne, aftershave, perfume and deodorant removed Jewelry removed (may wrap wedding band) NA Make-up removed NA Hair care products removed Battery operated devices (external) removed Heating patches and chemical warmers removed Titanium eyewear removed NA Nail polish cured  greater than 10 hours NA Casting material cured greater than 10 hours NA Hearing aids removed NA Loose dentures or partials removed NA Prosthetics have been removed NA Patient demonstrates correct use of air break device (if applicable) Patient concerns have been addressed Patient grounding bracelet  on and cord attached to chamber Specifics for Inpatients (complete in addition to above) Medication sheet sent with patient NA Intravenous medications needed or due during therapy sent with patient NA Drainage tubes (e.g. nasogastric tube or chest tube secured and vented) NA Endotracheal or Tracheotomy tube secured NA Cuff deflated of air and inflated with saline NA Airway suctioned NA Notes The safety checklist was done before treatment started. Electronic Signature(s) Signed: 12/30/2021 11:18:10 AM By: [name] EMT Previous Signature: 12/30/2021 11:06:24 AM Version By: [name] EMT Entered By: [name] on 12/30/2021 11:18:10

## 2021-12-30 NOTE — Progress Notes (Signed)
[name], [name] Visit Report for 12/30/2021 Problem List Details Patient Name: Date of Service: NA [name] 12/30/2021 10:00 A M Medical Record Number: [id number] Patient Account Number: [id number] Date of Birth/Sex: Treating RN: [birth year] (66 y.o. 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 12/30/2021 Medical Record Number: [id number] Patient Account Number: [id number] Date of Birth/Sex: Treating RN: [birth year] (66 y.o. 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 Facility Procedures CPT4 Code: [id number] Description: G0277-(Facility Use Only) HBOT full body chamber, , ICD-10 Diagnosis Description E11.621 Type 2 diabetes mellitus with foot ulcer 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 M86.672 Other chronic  osteomyelitis, left ankle and foot 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

## 2021-12-31 ENCOUNTER — Encounter (HOSPITAL_BASED_OUTPATIENT_CLINIC_OR_DEPARTMENT_OTHER): Payer: BC Managed Care – PPO | Admitting: General Surgery

## 2021-12-31 DIAGNOSIS — I252 Old myocardial infarction: Secondary | ICD-10-CM | POA: Diagnosis not present

## 2021-12-31 DIAGNOSIS — Z7984 Long term (current) use of oral hypoglycemic drugs: Secondary | ICD-10-CM | POA: Diagnosis not present

## 2021-12-31 DIAGNOSIS — I2510 Atherosclerotic heart disease of native coronary artery without angina pectoris: Secondary | ICD-10-CM | POA: Diagnosis not present

## 2021-12-31 DIAGNOSIS — Z7982 Long term (current) use of aspirin: Secondary | ICD-10-CM | POA: Diagnosis not present

## 2021-12-31 DIAGNOSIS — Z9181 History of falling: Secondary | ICD-10-CM | POA: Diagnosis not present

## 2021-12-31 DIAGNOSIS — E785 Hyperlipidemia, unspecified: Secondary | ICD-10-CM | POA: Diagnosis not present

## 2021-12-31 DIAGNOSIS — M86672 Other chronic osteomyelitis, left ankle and foot: Secondary | ICD-10-CM | POA: Diagnosis not present

## 2021-12-31 DIAGNOSIS — D649 Anemia, unspecified: Secondary | ICD-10-CM | POA: Diagnosis not present

## 2021-12-31 DIAGNOSIS — Z794 Long term (current) use of insulin: Secondary | ICD-10-CM | POA: Diagnosis not present

## 2021-12-31 DIAGNOSIS — T8131XA Disruption of external operation (surgical) wound, not elsewhere classified, initial encounter: Secondary | ICD-10-CM | POA: Diagnosis not present

## 2021-12-31 DIAGNOSIS — E11622 Type 2 diabetes mellitus with other skin ulcer: Secondary | ICD-10-CM | POA: Diagnosis not present

## 2021-12-31 DIAGNOSIS — E11621 Type 2 diabetes mellitus with foot ulcer: Secondary | ICD-10-CM | POA: Diagnosis not present

## 2021-12-31 DIAGNOSIS — I10 Essential (primary) hypertension: Secondary | ICD-10-CM | POA: Diagnosis not present

## 2021-12-31 DIAGNOSIS — E1140 Type 2 diabetes mellitus with diabetic neuropathy, unspecified: Secondary | ICD-10-CM | POA: Diagnosis not present

## 2021-12-31 DIAGNOSIS — L97322 Non-pressure chronic ulcer of left ankle with fat layer exposed: Secondary | ICD-10-CM | POA: Diagnosis not present

## 2021-12-31 DIAGNOSIS — K219 Gastro-esophageal reflux disease without esophagitis: Secondary | ICD-10-CM | POA: Diagnosis not present

## 2021-12-31 DIAGNOSIS — E11610 Type 2 diabetes mellitus with diabetic neuropathic arthropathy: Secondary | ICD-10-CM | POA: Diagnosis not present

## 2021-12-31 DIAGNOSIS — M1990 Unspecified osteoarthritis, unspecified site: Secondary | ICD-10-CM | POA: Diagnosis not present

## 2021-12-31 DIAGNOSIS — L97324 Non-pressure chronic ulcer of left ankle with necrosis of bone: Secondary | ICD-10-CM | POA: Diagnosis not present

## 2021-12-31 DIAGNOSIS — Z951 Presence of aortocoronary bypass graft: Secondary | ICD-10-CM | POA: Diagnosis not present

## 2021-12-31 LAB — GLUCOSE, CAPILLARY
Glucose-Capillary: 277 mg/dL — ABNORMAL HIGH (ref 70–99)
Glucose-Capillary: 211 mg/dL — ABNORMAL HIGH (ref 70–99)

## 2021-12-31 NOTE — Progress Notes (Signed)
BECKER, CHRISTOPHER ([id number]) Visit Report for 12/31/2021 Problem List Details Patient Name: Date of Service: NA [name] 12/31/2021 10:00 A M Medical Record Number: [id number] Patient Account Number: [id number] Date of Birth/Sex: Treating RN: 18-Oct-1955 (66 y.o. 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 12/31/2021 Medical Record Number: [id number] Patient Account Number: [id number] Date of Birth/Sex: Treating RN: [birth year] (66 y.o. 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 Facility Procedures CPT4 Code: [id number] Description: G0277-(Facility Use Only) HBOT full body chamber, , ICD-10 Diagnosis Description E11.621 Type 2 diabetes mellitus with foot ulcer 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 M86.672 Other chronic  osteomyelitis, left ankle and foot 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

## 2021-12-31 NOTE — Progress Notes (Signed)
JAQUALIN, SERPA ([id number]) Visit Report for 12/31/2021 Chief Complaint Document Details Patient Name: Date of Service: NA [name] 12/31/2021 12:45 PM Medical Record Number: [id number] Patient Account Number: [id number] Date of Birth/Sex: Treating RN: 09/08/1955 (66 y.o. [name] Primary Care Provider: [name] Other Clinician: Referring Provider: Treating Provider/Extender: [name] in Treatment: 18 Information Obtained from: Patient Chief Complaint Patients presents for treatment of an open diabetic ulcer with exposed bone and osteomyelitis Electronic Signature(s) Signed: 12/31/2021 12:14:49 PM By: [name] MD FACS Entered By: [name] on 12/31/2021 12:14:49 -------------------------------------------------------------------------------- Debridement Details Patient Name: Date of Service: NA Colin Nephew A. 12/31/2021 12:45 PM Medical Record Number: [id number] Patient Account Number: [id number] Date of Birth/Sex: Treating RN: 07/07/1955 (66 y.o. [name] Primary Care Provider: [name] Other Clinician: Referring Provider: Treating Provider/Extender: [name] in Treatment: 18 Debridement Performed for Assessment: Wound #1 Left,Medial Foot Performed By: Physician [name], MD Debridement Type: Debridement Severity of Tissue Pre Debridement: Fat layer exposed Level of Consciousness (Pre-procedure): Awake and Alert Pre-procedure Verification/Time Out Yes - 12:02 Taken: Start Time: 12:02 Pain Control: Lidocaine 4% T opical Solution T Area Debrided (L x W): otal 6.3 (cm) x 2.2 (cm) = 13.86 (cm) Tissue and other material debrided: Non-Viable, Slough, Slough Level: Non-Viable Tissue Debridement Description: Selective/Open Wound Instrument: Curette Bleeding: Minimum Hemostasis Achieved: Pressure Procedural Pain: 0 Post Procedural Pain: 0 Response to Treatment:  Procedure was tolerated well Level of Consciousness (Post- Awake and Alert procedure): Post Debridement Measurements of Total Wound Length: (cm) 6.3 Width: (cm) 2.2 Depth: (cm) 0.2 Volume: (cm) 2.177 Character of Wound/Ulcer Post Debridement: Improved Severity of Tissue Post Debridement: Fat layer exposed Post Procedure Diagnosis Same as Pre-procedure Electronic Signature(s) Signed: 12/31/2021 2:58:58 PM By: [name] MD FACS Signed: 12/31/2021 5:26:28 PM By: Samuella Bruin Entered By: Samuella Bruin on 12/31/2021 12:09:23 -------------------------------------------------------------------------------- HPI Details Patient Name: Date of Service: NA Colin Nephew A. 12/31/2021 12:45 PM Medical Record Number: [id number] Patient Account Number: [id number] Date of Birth/Sex: Treating RN: 21-Apr-1956 (66 y.o. [name] Primary Care Provider: [name] Other Clinician: Referring Provider: Treating Provider/Extender: [name]: 18 History of Present Illness HPI Description: ADMISSION 08/25/2021 This is a 66 year old man who initially presented to his primary care provider in September 2022 with pain in his left foot. He was sent for an x-ray and while the x-ray was being performed, the tech pointed out a wound on his foot that the patient was not aware existed. He does have type 2 diabetes with significant neuropathy. His diabetes is suboptimally controlled with his most recent A1c being 8.5. He also has a history of coronary artery disease status post three- vessel CABG. he was initially seen by orthopedics, but they referred him to [facility]. He has undergone at least 7 operations/debridements and several applications of skin substitute under the care of podiatry. He has been in a wound VAC for much of this time. His most recent procedure was July 28, 2021. A portion of the talus was biopsied and was  found to be consistent with osteomyelitis. Culture also returned positive for corynebacterium. He was seen on August 16, 2021 by infectious disease. A PICC line has been placed and he will be receiving a 6-week course of IV daptomycin and cefepime. In October 2022, he underwent lower extremity vascular studies. Results are copied here: Right: Resting right ankle-brachial  index is within normal range. No evidence of significant right lower extremity arterial disease. The right toe-brachial index is abnormal. Left: Resting left ankle-brachial index indicates mild left lower extremity arterial disease. The left toe-brachial index is abnormal. He has not been seen by vascular surgery despite these findings. He presented to clinic today in a cam boot and is using a knee scooter to offload. Wound VAC was in place. Once this was removed, a large ulcer was identified on the left midfoot/ankle. Bone is frankly exposed. There is no malodorous or purulent drainage. There is some granulation tissue over the central portion of the exposed bone. There is a tunnel that extends posteriorly for roughly 10 cm. It has been discussed with him by multiple providers that he is at very high risk of losing his lower leg because of this wound. He is extremely eager to avoid this outcome and is here today to review his options as well as receive ongoing wound care. 09/03/2021: Here for reevaluation of his wound. There does not appear to have been any substantial improvement overall since our last visit. He has been in a wound VAC with white foam overlying the exposed bone. We are working on getting him approved for hyperbaric oxygen therapy. 09/10/2021: We are in the process of getting him cleared to begin hyperbaric oxygen therapy. He still needs to obtain a chest x-ray. Although the wound measurements are roughly the same, I think the overall appearance of the wound is better. The exposed bone has a bit more granulation tissue  covering it. He has not received a vascular surgery appointment to reevaluate his flow to the wound. 09/17/2021: He has been approved for hyperbaric oxygen therapy and completed his chest x-ray, which I reviewed and it appears normal. The tunnels at the 12 and 10:00 positions are smaller. There is more granulation tissue covering the exposed bone and the undermining has decreased. He still has not received a vascular surgery appointment. 09/24/2021: He initiated hyperbaric oxygen therapy this week and is tolerating it well. He has an appointment with vascular surgery coming up on May 16. The granulation tissue is covering more of the exposed bone and both tunnels are a bit smaller. 10/01/2021: He continues to tolerate hyperbaric oxygen therapy. He saw infectious disease and they are planning to pull his PICC line. He has been initiated on oral antibiotics (doxycycline and Augmentin). The wound looks about the same but the tunnels are a little bit smaller. The skin seems to be contracting somewhat around the exposed bone. 10/08/2021: The wound is still about the same size, but the tunnels continue to come in and the skin is contracting around the exposed bone. He continues to have some accumulation of necrotic material in the inferoposterior aspect of the wound as well as accumulation at the 12:00 tunnel area. 10/15/2021: The wound is smaller today. The tunnels continue to come in. There is less necrotic tissue present. He does have some periwound maceration. 10/22/2021: The wound is about the same size. There is a little bit less undermining at the distal portion. The exposed bone is dark and I am not sure if this is staining from silver nitrate or his VAC sponge or if it represents necrosis. The tunnels are shallower but he does have some serous drainage coming from the 10:00 tunnel. He continues to tolerate hyperbaric oxygen therapy well. 10/29/2021: The undermining continues to improve. The tunnels are  about the same. He has good granulation tissue overlying the majority of the  exposed bone. It does appear that perhaps the tubing from his wound VAC has been eroding the skin at the 12 clock position. He continues to accumulate senescent epithelium around the borders of the wound. 11/05/2021: The undermining is almost completely resolved. The tunnels have contracted fairly significantly. No significant slough or debris accumulation. There is still senescent epithelium accumulation around the borders of the wound. He has been tolerating hyperbaric oxygen therapy well. 11/12/2021: Despite the measurements of the wound being about the same, the wound has changed in its shape and overall, I think it is improved. The undermining has resolved and the tunnels continue to shorten. There is good granulation tissue encroaching over the small area of bone that has remained exposed at the 12 o'clock position. Minimal slough accumulation. He continues to tolerate hyperbaric oxygen therapy well. 11/19/2021: I took a PCR culture last week. There was overgrowth of yeast. He is already taking suppressive doxycycline and Augmentin. I added fluconazole to his regimen. The wound is smaller again today. The tunnels continue to shorten. He continues to do well with hyperbaric oxygen therapy. 11/26/2021: For some reason, his foot has become macerated. The wound is narrower but about the same dimensions in its longitudinal aspect. The tunnels continue to shorten. He has some slough buildup on the wound as well as some heaped up senescent epithelium around the perimeter. 12/03/2021: No further maceration of his foot has occurred. The wound has contracted quite significantly from last week. The tunnel at 10:00 is closed. The tunnel at 12:00 is down to just a couple of millimeters. No other undermining is present. There is soft tissue coverage of the previously exposed bone. There is just a bit of slough and biofilm on the wound  surface. 12/10/2021: The wound is looking good. It turns out the tunnel at 12:00 is only exposed when the patient dorsiflexes his foot. It is about 2 cm in depth when he does this; when his foot is in plantarflexion, the tunnel is closed. The bone that was visible at the 12:00 tunnel is completely covered with granulation tissue, but there does feel like some exposed bone deeper into the tunnel area. There is senescent skin heaped up around the periphery. Minimal slough on the wound surface. 12/16/2021: The wound dimensions are roughly the same. The surface has nice granulation tissue. The exposed bone at the 12:00 tunnel continues to be covered with more soft tissue. 7/14; patient's wound measures smaller today. Using the wound VAC with underlying collagen. He is also being treated with HBO for underlying osteomyelitis. He tells me he is on doxycycline and ampicillin follows with infectious disease next week 12/31/2021: The wound continues to contract. Unfortunately, the area where the track pad and tubing have been rubbing continues to look like it is applying friction. He says that the home health nurses that have been applying the [facility] have been putting gauze underneath the tubing, but nonetheless there is ongoing tissue breakdown at this site. Light slough accumulation on the wound surface. The tunnel continues to contract. He is tolerating HBO without difficulty. Electronic Signature(s) Signed: 12/31/2021 12:16:26 PM By: [name] MD FACS Entered By: [name] on 12/31/2021 12:16:26 -------------------------------------------------------------------------------- Physical Exam Details Patient Name: Date of Service: NA Colin Nephew A. 12/31/2021 12:45 PM Medical Record Number: [id number] Patient Account Number: [id number] Date of Birth/Sex: Treating RN: 03/26/1956 (66 y.o. [name] Primary Care Provider: [name] Other Clinician: Referring Provider: Treating  Provider/Extender: [name]:  18 Constitutional Hypertensive, asymptomatic. . . . No acute distress.. Ears, Nose, Mouth, and Throat Cerumen completely obscuring both tympanic membranes. Respiratory Normal work of breathing on room air.. Notes 12/31/2021: The wound continues to contract. Unfortunately, the area where the track pad and tubing have been rubbing continues to look like it is applying friction. He says that the home health nurses that have been applying the [facility] have been putting gauze underneath the tubing, but nonetheless there is ongoing tissue breakdown at this site. Light slough accumulation on the wound surface. The tunnel continues to contract. Electronic Signature(s) Signed: 12/31/2021 12:17:17 PM By: [name] MD FACS Entered By: [name] on 12/31/2021 12:17:17 -------------------------------------------------------------------------------- Physician Orders Details Patient Name: Date of Service: NA Colin Nephew A. 12/31/2021 12:45 PM Medical Record Number: [id number] Patient Account Number: [id number] Date of Birth/Sex: Treating RN: December 11, 1955 (66 y.o. 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 - Dr. Lady Gary - Room 2 Return A Bathing/ Shower/ Hygiene May shower with protection but do not get wound dressing(s) wet. Negative Presssure  Wound Therapy Wound Vac to wound continuously at 19mm/hg pressure Black Foam White Foam - Apply white foam or saline moistened gauze to tunnel at 10:00 and 12:00 Other: - Apply silver collagen on wound bed, under black foam Home Health New wound care orders this week; continue Home Health for wound care. May utilize formulary equivalent dressing for wound treatment orders unless otherwise specified. - recommend bridging the wound vac to avoid pressure on the wound Dressing changes to be completed by Home Health on Monday / Wednesday / Friday except when patient has scheduled visit at [facility]. Other Home Health Orders/Instructions: [name]: Change vac 3x week Mon. Wed. and Fri. after wound care visit. Hyperbaric Oxygen Therapy Evaluate for HBO Therapy Indication: - Wagner 3 diabetic ulcer left foot If appropriate for treatment, begin HBOT per protocol: 2.5 ATA for 90 Minutes with 2 Five (5) Minute A Breaks ir Total Number of Treatments: - 12/31/2021 additional 40 treatments. T of 120 otal One treatments per day (delivered Monday through Friday unless otherwise specified in Special Instructions below): Finger stick Blood Glucose Pre- and Post- HBOT Treatment. Follow Hyperbaric Oxygen Glycemia Protocol A frin (Oxymetazoline HCL) 0.05% nasal spray - 1 spray in both nostrils daily as needed prior to HBO treatment for difficulty clearing ears Wound Treatment Wound #1 - Foot Wound Laterality: Left, Medial Cleanser: Wound Cleanser 3 x Per Week/30 Days Discharge Instructions: Cleanse the wound with wound cleanser prior to applying a clean dressing using gauze sponges, not tissue or cotton balls. Peri-Wound Care: Skin Prep 3 x Per Week/30 Days Discharge Instructions: Use skin prep as directed Peri-Wound Care: Zinc Oxide Ointment 30g tube 3 x Per Week/30 Days Discharge Instructions: Apply Zinc Oxide to periwound with each dressing change Prim Dressing: Promogran Prisma Matrix, 4.34  (sq in) (silver collagen) 3 x Per Week/30 Days ary Discharge Instructions: Moisten collagen with saline or hydrogel Secondary Dressing: ABD Pad, 5x9 3 x Per Week/30 Days Discharge Instructions: In  clinic. Home Health to apply wound vac. Secondary Dressing: Woven Gauze Sponge, Non-Sterile 4x4 in 3 x Per Week/30 Days Discharge Instructions: In clinic. Home Health to apply wound vac. Secured With: [employer], 4.5x3.1 (in/yd) 3 x Per Week/30 Days Discharge Instructions: In clinic. Home Health to apply wound vac. Secured With: 45M Medipore H Soft Cloth Surgical T ape, 4 x 10 (in/yd) 3 x Per Week/30 Days Discharge Instructions: In clinic. Home Health to apply wound vac. GLYCEMIA INTERVENTIONS PROTOCOL PRE-HBO GLYCEMIA INTERVENTIONS ACTION INTERVENTION Obtain pre-HBO capillary blood glucose (ensure 1 physician order is in chart). A. Notify HBO physician and await physician orders. 2 If result is 70 mg/dl or below: B. If the result meets the hospital definition of a critical result, follow hospital policy. A. Give patient an 8 ounce Glucerna Shake, an 8 ounce Ensure, or 8 ounces of a Glucerna/Ensure equivalent dietary supplement*. B. Wait 30 minutes. If result is 71 mg/dl to 161 mg/dl: C. Retest patients capillary blood glucose (CBG). D. If result greater than or equal to 110 mg/dl, proceed with HBO. If result less than 110 mg/dl, notify HBO physician and consider holding HBO. If result is 131 mg/dl to 096 mg/dl: A. Proceed with HBO. A. Notify HBO physician and await physician orders. B. It is recommended to hold HBO and do If result is 250 mg/dl or greater: blood/urine ketone testing. C. If the result meets the hospital definition of a critical result, follow hospital policy. POST-HBO GLYCEMIA INTERVENTIONS ACTION INTERVENTION Obtain post HBO capillary blood glucose (ensure 1 physician order is in chart). A. Notify HBO physician and await physician orders. 2 If  result is 70 mg/dl or below: B. If the result meets the hospital definition of a critical result, follow hospital policy. A. Give patient an 8 ounce Glucerna Shake, an 8 ounce Ensure, or 8 ounces of a Glucerna/Ensure equivalent dietary supplement*. B. Wait 15 minutes for symptoms of If result is 71 mg/dl to 045 mg/dl: hypoglycemia (i.e. nervousness, anxiety, sweating, chills, clamminess, irritability, confusion, tachycardia or dizziness). C. If patient asymptomatic, discharge patient. If patient symptomatic, repeat capillary blood glucose (CBG) and notify HBO physician. If result is 101 mg/dl to 409 mg/dl: A. Discharge patient. A. Notify HBO physician and await physician orders. B. It is recommended to do blood/urine ketone If result is 250 mg/dl or greater: testing. C. If the result meets the hospital definition of a critical result, follow hospital policy. *Juice or candies are NOT equivalent products. If patient refuses the Glucerna or Ensure, please consult the hospital dietitian for an appropriate substitute. Electronic Signature(s) Signed: 12/31/2021 2:58:58 PM By: [name] MD FACS Signed: 12/31/2021 5:26:28 PM By: Samuella Bruin Entered By: Samuella Bruin on 12/31/2021 12:54:54 -------------------------------------------------------------------------------- Problem List Details Patient Name: Date of Service: NA Colin Nephew A. 12/31/2021 12:45 PM Medical Record Number: [id number] Patient Account Number: [id number] Date of Birth/Sex: Treating RN: 05-Dec-1955 (66 y.o. 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 12/31/2021 12:45 PM Medical Record Number: [id number] Patient Account Number: [id number] Date of Birth/Sex: Treating RN: February 27, 1956 (66 y.o. [name] Primary Care Provider: [name] Other Clinician: Referring Provider: Treating Provider/Extender: [name] in Treatment: 18 Subjective Chief Complaint Information obtained from Patient Patients presents for treatment of an open diabetic ulcer with exposed bone and osteomyelitis History of Present Illness (HPI) ADMISSION 08/25/2021 This is a 66 year old man who initially presented to his primary care provider in September 2022 with pain in his left foot. He was sent for an x-ray and while the x-ray was being performed, the tech pointed out a wound on his foot that the patient was not aware existed. He does have type 2 diabetes with significant neuropathy. His diabetes is suboptimally controlled with his most recent A1c being 8.5. He also has a history of coronary artery disease status post three- vessel CABG. he was initially seen by orthopedics, but they referred him to [facility]. He has undergone at least 7 operations/debridements and several applications of skin substitute under the care of podiatry. He has been in a wound VAC for much of this time. His most recent procedure was  July 28, 2021. A portion of the talus was biopsied and was found to be consistent with osteomyelitis. Culture also returned positive for corynebacterium. He was seen on August 16, 2021 by infectious disease. A PICC line has been placed and he will be receiving a 6-week course of IV daptomycin and cefepime. In October 2022, he underwent lower extremity vascular studies. Results are copied here: Right: Resting right ankle-brachial index is within normal range. No evidence of significant right lower extremity arterial disease. The right toe-brachial index is abnormal. Left: Resting left ankle-brachial index indicates mild left lower extremity arterial disease. The left toe-brachial index is abnormal. He has not been seen by vascular surgery despite these findings. He presented to clinic today in a cam boot and is using a knee scooter to offload. Wound VAC was in place. Once this was removed, a large ulcer was identified on the left midfoot/ankle. Bone is frankly exposed. There is no malodorous or purulent drainage. There is some granulation tissue over the central portion of the exposed bone. There is a tunnel that extends posteriorly for roughly 10 cm. It has been discussed with him by multiple providers that he is at very high risk of losing his lower leg because of this wound. He is extremely eager to avoid this outcome and is here today to review his options as well as receive ongoing wound care. 09/03/2021: Here for reevaluation of his wound. There does not appear to have been any substantial improvement overall since our last visit. He has been in a wound VAC with white foam overlying the exposed bone. We are working on getting him approved for hyperbaric oxygen therapy. 09/10/2021: We are in the process of getting him cleared to begin hyperbaric oxygen therapy. He still needs to obtain a chest x-ray. Although the wound measurements are roughly the same, I think the overall appearance of the  wound is better. The exposed bone has a bit more granulation tissue covering it. He has not received a vascular surgery appointment to reevaluate his flow to the wound. 09/17/2021: He has been approved for hyperbaric oxygen therapy  and completed his chest x-ray, which I reviewed and it appears normal. The tunnels at the 12 and 10:00 positions are smaller. There is more granulation tissue covering the exposed bone and the undermining has decreased. He still has not received a vascular surgery appointment. 09/24/2021: He initiated hyperbaric oxygen therapy this week and is tolerating it well. He has an appointment with vascular surgery coming up on May 16. The granulation tissue is covering more of the exposed bone and both tunnels are a bit smaller. 10/01/2021: He continues to tolerate hyperbaric oxygen therapy. He saw infectious disease and they are planning to pull his PICC line. He has been initiated on oral antibiotics (doxycycline and Augmentin). The wound looks about the same but the tunnels are a little bit smaller. The skin seems to be contracting somewhat around the exposed bone. 10/08/2021: The wound is still about the same size, but the tunnels continue to come in and the skin is contracting around the exposed bone. He continues to have some accumulation of necrotic material in the inferoposterior aspect of the wound as well as accumulation at the 12:00 tunnel area. 10/15/2021: The wound is smaller today. The tunnels continue to come in. There is less necrotic tissue present. He does have some periwound maceration. 10/22/2021: The wound is about the same size. There is a little bit less undermining at the distal portion. The exposed bone is dark and I am not sure if this is staining from silver nitrate or his VAC sponge or if it represents necrosis. The tunnels are shallower but he does have some serous drainage coming from the 10:00 tunnel. He continues to tolerate hyperbaric oxygen therapy  well. 10/29/2021: The undermining continues to improve. The tunnels are about the same. He has good granulation tissue overlying the majority of the exposed bone. It does appear that perhaps the tubing from his wound VAC has been eroding the skin at the 12 clock position. He continues to accumulate senescent epithelium around the borders of the wound. 11/05/2021: The undermining is almost completely resolved. The tunnels have contracted fairly significantly. No significant slough or debris accumulation. There is still senescent epithelium accumulation around the borders of the wound. He has been tolerating hyperbaric oxygen therapy well. 11/12/2021: Despite the measurements of the wound being about the same, the wound has changed in its shape and overall, I think it is improved. The undermining has resolved and the tunnels continue to shorten. There is good granulation tissue encroaching over the small area of bone that has remained exposed at the 12 o'clock position. Minimal slough accumulation. He continues to tolerate hyperbaric oxygen therapy well. 11/19/2021: I took a PCR culture last week. There was overgrowth of yeast. He is already taking suppressive doxycycline and Augmentin. I added fluconazole to his regimen. The wound is smaller again today. The tunnels continue to shorten. He continues to do well with hyperbaric oxygen therapy. 11/26/2021: For some reason, his foot has become macerated. The wound is narrower but about the same dimensions in its longitudinal aspect. The tunnels continue to shorten. He has some slough buildup on the wound as well as some heaped up senescent epithelium around the perimeter. 12/03/2021: No further maceration of his foot has occurred. The wound has contracted quite significantly from last week. The tunnel at 10:00 is closed. The tunnel at 12:00 is down to just a couple of millimeters. No other undermining is present. There is soft tissue coverage of the previously  exposed bone. There is just a bit  of slough and biofilm on the wound surface. 12/10/2021: The wound is looking good. It turns out the tunnel at 12:00 is only exposed when the patient dorsiflexes his foot. It is about 2 cm in depth when he does this; when his foot is in plantarflexion, the tunnel is closed. The bone that was visible at the 12:00 tunnel is completely covered with granulation tissue, but there does feel like some exposed bone deeper into the tunnel area. There is senescent skin heaped up around the periphery. Minimal slough on the wound surface. 12/16/2021: The wound dimensions are roughly the same. The surface has nice granulation tissue. The exposed bone at the 12:00 tunnel continues to be covered with more soft tissue. 7/14; patient's wound measures smaller today. Using the wound VAC with underlying collagen. He is also being treated with HBO for underlying osteomyelitis. He tells me he is on doxycycline and ampicillin follows with infectious disease next week 12/31/2021: The wound continues to contract. Unfortunately, the area where the track pad and tubing have been rubbing continues to look like it is applying friction. He says that [facility] the tubing, but nonetheless there is ongoing tissue breakdown at this site. Light slough accumulation on the wound surface. The tunnel continues to contract. He is tolerating HBO without difficulty. Patient History Information obtained from Patient. Family History Cancer - Father, Diabetes - Father,Mother,Paternal Grandparents, Heart Disease - Father, Hypertension - Father, No family history of Hereditary Spherocytosis, Kidney Disease, Lung Disease, Seizures, Stroke, Thyroid Problems, Tuberculosis. Social History Never smoker, Marital Status - Married, Alcohol Use - Rarely, Drug Use - No History, Caffeine Use - Daily. Medical History Eyes Patient has history of  Cataracts - Removed 2008 Cardiovascular Patient has history of Coronary Artery Disease, Hypertension, Myocardial Infarction, Peripheral Arterial Disease Endocrine Patient has history of Type II Diabetes Musculoskeletal Patient has history of Osteomyelitis Neurologic Patient has history of Neuropathy Medical A Surgical History Notes nd Cardiovascular Hypercholesterolemia Abnormal EKG CABG X3 2019 Gastrointestinal GERD Musculoskeletal Diabetic foot ulcer Objective Constitutional Hypertensive, asymptomatic. No acute distress.. Vitals Time Taken: 11:53 AM, Height: 74 in, Weight: 186 lbs, BMI: 23.9, Temperature: 97.3 F, Pulse: 79 bpm, Respiratory Rate: 18 breaths/min, Blood Pressure: 161/92 mmHg, Capillary Blood Glucose: 211 mg/dl. Ears, Nose, Mouth, and Throat Cerumen completely obscuring both tympanic membranes. Respiratory Normal work of breathing on room air.. General Notes: 12/31/2021: The wound continues to contract. Unfortunately, the area where the track pad and tubing have been rubbing continues to look like it is applying friction. He says that the [facility] health [facility] gauze underneath the tubing, but nonetheless there is ongoing tissue breakdown at this site. Light slough accumulation on the wound surface. The tunnel continues to contract. Integumentary (Hair, Skin) Wound #1 status is Open. Original cause of wound was Gradually Appeared. The date acquired was: 02/22/2021. The wound has been in treatment 18 weeks. The wound is located on the Left,Medial Foot. The wound measures 6.3cm length x 2.2cm width x 0.2cm depth; 10.886cm^2 area and 2.177cm^3 volume. There is Fat Layer (Subcutaneous Tissue) exposed. There is no undermining noted, however, there is tunneling at 12:00 with a maximum distance of 2.1cm. There is a medium amount of serosanguineous drainage noted. The wound margin is distinct with the outline attached to the wound  base. There is large (67-100%) red, hyper - granulation within the wound bed. There is a  small (1-33%) amount of necrotic tissue within the wound bed including Adherent Slough. 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 There was a Selective/Open Wound Non-Viable Tissue Debridement with a total area of 13.86 sq cm performed by [name], MD. With the following instrument(s): Curette to remove Non-Viable tissue/material. Material removed includes [facility] after achieving pain control using Lidocaine 4% T opical Solution. No specimens were taken. A time out was conducted at 12:02, prior to the start of the procedure. A Minimum amount of bleeding was controlled with Pressure. The procedure was tolerated well with a pain level of 0 throughout and a pain level of 0 following the procedure. Post Debridement Measurements: 6.3cm length x 2.2cm width x 0.2cm depth; 2.177cm^3 volume. Character of Wound/Ulcer Post Debridement is improved. Severity of Tissue Post Debridement is: Fat layer exposed. Post procedure Diagnosis Wound #1: Same as Pre-Procedure Plan Follow-up Appointments: Return Appointment in 1 week. - Dr. Lady Gary - Room 2 Bathing/ Shower/ Hygiene: May shower with protection but do not get wound dressing(s) wet. Negative Presssure Wound Therapy: Wound Vac to wound continuously at 179mm/hg pressure Black Foam White Foam - Apply white foam or saline moistened gauze to tunnel at 10:00 and 12:00 Other: - Apply  silver collagen on wound bed, under black foam Home Health: No change in wound care orders this week; continue Home Health for wound care. May utilize formulary equivalent dressing for wound treatment orders unless otherwise specified. Dressing changes to be completed by Home Health on Monday / Wednesday / Friday except when patient has [facility]. Other Home Health Orders/Instructions: [name]: Change vac 3x week Mon. Wed. and Fri. after wound care visit. Hyperbaric Oxygen Therapy: Evaluate for HBO Therapy Indication: - Wagner 3 diabetic ulcer left foot If appropriate for treatment, begin HBOT per protocol: 2.5 ATA for 90 Minutes with 2 Five (5) Minute Air Breaks T Number of Treatments: - 12/31/2021 additional 40 treatments. T of 120 otal otal One treatments per day (delivered Monday through Friday unless otherwise specified in Special Instructions below): Finger stick Blood Glucose Pre- and Post- HBOT Treatment. Follow Hyperbaric Oxygen Glycemia Protocol Afrin (Oxymetazoline HCL) 0.05% nasal spray - 1 spray in both nostrils daily as needed prior to HBO treatment for difficulty clearing ears WOUND #1: - Foot Wound Laterality: Left, Medial Cleanser: Wound Cleanser 3 x Per Week/30 Days Discharge Instructions: Cleanse the wound with wound cleanser prior to applying a clean dressing using gauze sponges, not tissue or cotton balls. Peri-Wound Care: Skin Prep 3 x Per Week/30 Days Discharge Instructions: Use skin prep as directed Peri-Wound Care: Zinc Oxide Ointment 30g tube 3 x Per Week/30 Days Discharge Instructions: Apply Zinc Oxide to periwound with each dressing change Prim Dressing: Promogran Prisma Matrix, 4.34 (sq in) (silver collagen) 3 x Per Week/30 Days ary Discharge Instructions: Moisten collagen with saline or hydrogel Secondary Dressing: ABD Pad, 5x9 3 x Per Week/30 Days Discharge Instructions: In clinic. Home Health to apply wound vac. Secondary  Dressing: Woven Gauze Sponge, Non-Sterile 4x4 in 3 x Per Week/30 Days Discharge Instructions: In clinic. Home  Health to apply wound vac. Secured With: [employer], 4.5x3.1 (in/yd) 3 x Per Week/30 Days Discharge Instructions: In clinic. Home Health to apply wound vac. Secured With: 24M Medipore H Soft Cloth Surgical T ape, 4 x 10 (in/yd) 3 x Per Week/30 Days Discharge Instructions: In clinic. Home Health to apply wound vac. 12/31/2021: The wound continues to contract. Unfortunately, the area where the track pad and tubing have been rubbing continues to look like it is applying friction. He says that the home health nurses that have been applying the [facility] have been putting gauze underneath the tubing, but nonetheless there is ongoing tissue breakdown at this site. Light slough accumulation on the wound surface. The tunnel continues to contract. I used a curette to debride the slough from the wound surface. We will continue the wound VAC dressing; as he has home health, we are unable to apply the [facility] in clinic. We gave him detailed directions for avoiding further trauma to his wound from the track pad and tubing. We have recommended that the [facility] be bridged laterally and the track pad applied on his foot away from the wound. He will continue hyperbaric oxygen therapy. T oday, I reevaluated him and have recommended that he receive another 40 treatments. He will irrigate his ears with a one-to-one mixture of water and hydrogen peroxide to break up the cerumen. Follow-up with me in 1 week. Electronic Signature(s) Signed: 12/31/2021 12:19:47 PM By: [name] MD FACS Entered By: [name] on 12/31/2021 12:19:47 -------------------------------------------------------------------------------- HxROS Details Patient Name: Date of Service: NA [name] 12/31/2021 12:45 PM Medical Record Number: [id number] Patient Account Number: [id number] Date of Birth/Sex: Treating RN: 22-Sep-1955  (66 y.o. [name] Primary Care Provider: [name] Other Clinician: Referring Provider: Treating Provider/Extender: [name]: 18 Information Obtained From Patient Eyes Medical History: Positive for: Cataracts - Removed 2008 Cardiovascular Medical History: Positive for: Coronary Artery Disease; Hypertension; Myocardial Infarction; Peripheral Arterial Disease Past Medical History Notes: Hypercholesterolemia Abnormal EKG CABG X3 2019 Gastrointestinal Medical History: Past Medical History Notes: GERD Endocrine Medical History: Positive for: Type II Diabetes Time with diabetes: 24 years Treated with: Insulin, Oral agents Blood sugar tested every day: Yes Tested : Musculoskeletal Medical History: Positive for: Osteomyelitis Past Medical History Notes: Diabetic foot ulcer Neurologic Medical History: Positive for: Neuropathy HBO Extended History Items Eyes: Cataracts Immunizations Pneumococcal Vaccine: Received Pneumococcal Vaccination: Yes Received Pneumococcal Vaccination On or After 60th Birthday: Yes Implantable Devices Yes Family and Social History Cancer: Yes - Father; Diabetes: Yes - Father,Mother,Paternal Grandparents; Heart Disease: Yes - Father; Hereditary Spherocytosis: No; Hypertension: Yes - Father; Kidney Disease: No; Lung Disease: No; Seizures: No; Stroke: No; Thyroid Problems: No; Tuberculosis: No; Never smoker; Marital Status - Married; Alcohol Use: Rarely; Drug Use: No History; Caffeine Use: Daily; Financial Concerns: No; Food, Clothing or Shelter Needs: No; Support System Lacking: No; Transportation Concerns: No Electronic Signature(s) Signed: 12/31/2021 2:58:58 PM By: [name] MD FACS Signed: 12/31/2021 5:26:28 PM By: Samuella Bruin Entered By: [name] on 12/31/2021 12:16:31 -------------------------------------------------------------------------------- SuperBill  Details Patient Name: Date of Service: NA Colin Nephew A. 12/31/2021 Medical Record Number: [id number] Patient Account Number: [id number] Date of Birth/Sex: Treating RN: 12-Jan-1956 (66 y.o. 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 Facility Procedures CPT4 Code: [id number] Description: [phone] - DEBRIDE WOUND 1ST 20 SQ CM OR < ICD-10 Diagnosis Description L97.324 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 Modifier: Quantity: 1 Physician Procedures : CPT4 Code Description Modifier [id number] - WC PHYS LEVEL 3 - EST PT 25 ICD-10 Diagnosis Description L97.324 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

## 2021-12-31 NOTE — Progress Notes (Addendum)
WARWICK, NICK ([id number]) Visit Report for 12/31/2021 Arrival Information Details Patient Name: Date of Service: NA [name] 12/31/2021 10:00 A M Medical Record Number: [id number] Patient Account Number: [id number] Date of Birth/Sex: Treating RN: Sep 17, 1955 (66 y.o. [name] Primary Care Aluna Whiston: [name] Other Clinician: [name] Referring Jenney Brester: Treating Tylea Hise/Extender: [name] in Treatment: 18 Visit Information History Since Last Visit All ordered tests and consults were completed: Yes Patient Arrived: Knee Scooter Added or deleted any medications: No Arrival Time: 09:22 Any new allergies or adverse reactions: No Accompanied By: Wife Had a fall or experienced change in No Transfer Assistance: None activities of daily living that may affect Patient Identification Verified: Yes risk of falls: Secondary Verification Process Completed: Yes Signs or symptoms of abuse/neglect since last visito No Patient Requires Transmission-Based Precautions: No Hospitalized since last visit: No Patient Has Alerts: Yes Implantable device outside of the clinic excluding No Patient Alerts: Patient on Blood Thinner cellular tissue based products placed in the center since last visit: Pain Present Now: No Electronic Signature(s) Signed: 12/31/2021 11:04:45 AM By: [name] EMT Entered By: [name] on 12/31/2021 11:04:44 -------------------------------------------------------------------------------- Encounter Discharge Information Details Patient Name: Date of Service: NA [name] 12/31/2021 10:00 A M Medical Record Number: [id number] Patient Account Number: [id number] Date of Birth/Sex: Treating RN: 1955/09/25 (66 y.o. [name] Primary Care Genifer Lazenby: [name] Other Clinician: [name] Referring Zelma Mazariego: Treating Mattalyn Anderegg/Extender: [name] in Treatment: 18 Encounter  Discharge Information Items Discharge Condition: Stable Ambulatory Status: Knee Scooter Discharge Destination: Other (Note Required) Transportation: Private Auto Accompanied By: Wife Schedule Follow-up Appointment: Yes Clinical Summary of Care: Notes The patient has a wound care clinic visit after his treatment today. Electronic Signature(s) Signed: 12/31/2021 12:03:17 PM By: [name] EMT Entered By: [name] on 12/31/2021 12:03:17 -------------------------------------------------------------------------------- Vitals Details Patient Name: Date of Service: NA [name] 12/31/2021 10:00 A M Medical Record Number: [id number] Patient Account Number: [id number] Date of Birth/Sex: Treating RN: [birth year] (66 y.o. [name] Primary Care Lorell Thibodaux: [name] Other Clinician: [name] Referring Shaylyn Bawa: Treating Lorenza Winkleman/Extender: [name]: 18 Vital Signs Time Taken: 09:36 Temperature (F): 97.3 Height (in): 74 Pulse (bpm): 92 Weight (lbs): 186 Respiratory Rate (breaths/min): 18 Body Mass Index (BMI): 23.9 Blood Pressure (mmHg): 120/81 Capillary Blood Glucose (mg/dl): 941 Reference Range: 80 - 120 mg / dl Electronic Signature(s) Signed: 12/31/2021 11:05:37 AM By: [name] EMT Entered By: [name] on 12/31/2021 11:05:36

## 2021-12-31 NOTE — Progress Notes (Signed)
[name], [name] ([id number]) Visit Report for 12/31/2021 Arrival Information Details Patient Name: Date of Service: NA [name] 12/31/2021 12:45 PM Medical Record Number: [id number] Patient Account Number: [id number] Date of Birth/Sex: Treating RN: Nov 02, 1955 (66 y.o. [name] Primary Care Jarika Robben: [name] Other Clinician: Referring Tamelia Michalowski: Treating Kailei Cowens/Extender: [name] in Treatment: 18 Visit Information History Since Last Visit Added or deleted any medications: No Patient Arrived: Knee Scooter Any new allergies or adverse reactions: No Arrival Time: 11:49 Had a fall or experienced change in No Accompanied By: self activities of daily living that may affect Transfer Assistance: None risk of falls: Patient Identification Verified: Yes Signs or symptoms of abuse/neglect since last visito No Secondary Verification Process Completed: Yes Hospitalized since last visit: No Patient Requires Transmission-Based Precautions: No Implantable device outside of the clinic excluding No Patient Has Alerts: Yes cellular tissue based products placed in the center Patient Alerts: Patient on Blood Thinner since last visit: Has Dressing in Place as Prescribed: Yes Pain Present Now: No Electronic Signature(s) Signed: 12/31/2021 5:26:28 PM By: Adline Peals Entered By: Adline Peals on 12/31/2021 11:53:00 -------------------------------------------------------------------------------- Encounter Discharge Information Details Patient Name: Date of Service: [name] 12/31/2021 12:45 PM Medical Record Number: [id number] Patient Account Number: [id number] Date of Birth/Sex: Treating RN: 1955-07-08 (66 y.o. [name] Primary Care Adriel Desrosier: [name] Other Clinician: Referring Meghanne Pletz: Treating Jamone Garrido/Extender: [name] in Treatment: 18 Encounter Discharge Information Items Post  Procedure Vitals Discharge Condition: Stable Temperature (F): 97.3 Ambulatory Status: Knee Scooter Pulse (bpm): 76 Discharge Destination: Home Respiratory Rate (breaths/min): 18 Transportation: Private Auto Blood Pressure (mmHg): 161/92 Accompanied By: self Schedule Follow-up Appointment: Yes Clinical Summary of Care: Patient Declined Electronic Signature(s) Signed: 12/31/2021 5:26:28 PM By: Adline Peals Entered By: Adline Peals on 12/31/2021 12:55:44 -------------------------------------------------------------------------------- Lower Extremity Assessment Details Patient Name: Date of Service: Colin Loveless A. 12/31/2021 12:45 PM Medical Record Number: [id number] Patient Account Number: [id number] Date of Birth/Sex: Treating RN: Mar 25, 1956 (66 y.o. [name] Primary Care Angelize Ryce: [name] Other Clinician: Referring Angeleigh Chiasson: Treating Luvern Mcisaac/Extender: [name] Weeks in Treatment: 18 Edema Assessment Assessed: Shirlyn Goltz: No] Patrice Paradise: No] Edema: [Left: Ye] [Right: s] Calf Left: Right: Point of Measurement: From Medial Instep 35.4 cm Ankle Left: Right: Point of Measurement: From Medial Instep 23 cm Vascular Assessment Pulses: Dorsalis Pedis Palpable: [Left:Yes] Electronic Signature(s) Signed: 12/31/2021 5:26:28 PM By: Adline Peals Entered By: Adline Peals on 12/31/2021 11:57:33 -------------------------------------------------------------------------------- Multi Wound Chart Details Patient Name: Date of Service: Colin Loveless A. 12/31/2021 12:45 PM Medical Record Number: [id number] Patient Account Number: [id number] Date of Birth/Sex: Treating RN: [birth year] (66 y.o. [name] Primary Care Mehreen Azizi: [name] Other Clinician: Referring Demont Linford: Treating Jhonatan Lomeli/Extender: [name] Weeks in Treatment: 18 Vital Signs Height(in): 74 Capillary Blood Glucose(mg/dl):  211 Weight(lbs): 186 Pulse(bpm): 58 Body Mass Index(BMI): 23.9 Blood Pressure(mmHg): 161/92 Temperature(F): 97.3 Respiratory Rate(breaths/min): 18 Photos: [1:Left, Medial Foot] [N/A:N/A N/A] Wound Location: [1:Gradually Appeared] [N/A:N/A] Wounding Event: [1:Diabetic Wound/Ulcer of the Lower] [N/A:N/A] Primary Etiology: [1:Extremity Cataracts, Coronary Artery Disease, N/A] Comorbid History: [1:Hypertension, Myocardial Infarction, Peripheral Arterial Disease, Type II Diabetes, Osteomyelitis, Neuropathy 02/22/2021] [N/A:N/A] Date Acquired: [1:18] [N/A:N/A] Weeks of Treatment: [1:Open] [N/A:N/A] Wound Status: [1:No] [N/A:N/A] Wound Recurrence: [1:6.3x2.2x0.2] [N/A:N/A] Measurements L x W x D (cm) [1:10.886] [N/A:N/A] A (cm) : rea [1:2.177] [N/A:N/A] Volume (cm) : [1:23.00%] [N/A:N/A] % Reduction in A [1:rea: 84.60%] [N/A:N/A] %  Reduction in Volume: [1:12] Position 1 (o'clock): [1:2.1] Maximum Distance 1 (cm): [1:Yes] [N/A:N/A] Tunneling: [1:Grade 3] [N/A:N/A] Classification: [1:Medium] [N/A:N/A] Exudate A mount: [1:Serosanguineous] [N/A:N/A] Exudate Type: [1:red, brown] [N/A:N/A] Exudate Color: [1:Distinct, outline attached] [N/A:N/A] Wound Margin: [1:Large (67-100%)] [N/A:N/A] Granulation A mount: [1:Red, Hyper-granulation] [N/A:N/A] Granulation Quality: [1:Small (1-33%)] [N/A:N/A] Necrotic A mount: [1:Fat Layer (Subcutaneous Tissue): Yes N/A] Exposed Structures: [1:Fascia: No Tendon: No Muscle: No Joint: No Bone: No Small (1-33%)] [N/A:N/A] Epithelialization: [1:Debridement - Selective/Open Wound N/A] Debridement: Pre-procedure Verification/Time Out 12:02 [N/A:N/A] Taken: [1:Lidocaine 4% Topical Solution] [N/A:N/A] Pain Control: [1:Slough] [N/A:N/A] Tissue Debrided: [1:Non-Viable Tissue] [N/A:N/A] Level: [1:13.86] [N/A:N/A] Debridement A (sq cm): [1:rea Curette] [N/A:N/A] Instrument: [1:Minimum] [N/A:N/A] Bleeding: [1:Pressure] [N/A:N/A] Hemostasis A chieved: [1:0]  [N/A:N/A] Procedural Pain: [1:0] [N/A:N/A] Post Procedural Pain: [1:Procedure was tolerated well] [N/A:N/A] Debridement Treatment Response: [1:6.3x2.2x0.2] [N/A:N/A] Post Debridement Measurements L x W x D (cm) [1:2.177] [N/A:N/A] Post Debridement Volume: (cm) [1:Debridement] [N/A:N/A] Treatment Notes Electronic Signature(s) Signed: 12/31/2021 12:14:42 PM By: [name] MD FACS Signed: 12/31/2021 5:26:28 PM By: Adline Peals Entered By: [name] on 12/31/2021 12:14:42 -------------------------------------------------------------------------------- Multi-Disciplinary Care Plan Details Patient Name: Date of Service: Colin Loveless A. 12/31/2021 12:45 PM Medical Record Number: [id number] Patient Account Number: [id number] Date of Birth/Sex: Treating RN: 12/08/1955 (66 y.o. [name] Primary Care Ashe Graybeal: [name] Other Clinician: Referring Imara Standiford: Treating Eastyn Dattilo/Extender: [name] in Treatment: 18 Multidisciplinary Care Plan reviewed with physician Active Inactive HBO Nursing Diagnoses: Anxiety related to feelings of confinement associated with the hyperbaric oxygen chamber Anxiety related to knowledge deficit of hyperbaric oxygen therapy and treatment procedures Discomfort related to temperature and humidity changes inside hyperbaric chamber Potential for barotraumas to ears, sinuses, teeth, and lungs or cerebral gas embolism related to changes in atmospheric pressure inside hyperbaric oxygen chamber Potential for oxygen toxicity seizures related to delivery of 100% oxygen at an increased atmospheric pressure Potential for pulmonary oxygen toxicity related to delivery of 100% oxygen at an increased atmospheric pressure Goals: Barotrauma will be prevented during HBO2 Date Initiated: 09/17/2021 T arget Resolution Date: 01/07/2022 Goal Status: Active Patient will tolerate the hyperbaric oxygen therapy treatment Date  Initiated: 09/17/2021 T arget Resolution Date: 01/07/2022 Goal Status: Active Patient will tolerate the internal climate of the chamber Date Initiated: 09/17/2021 T arget Resolution Date: 01/07/2022 Goal Status: Active Patient/caregiver will verbalize understanding of HBO goals, rationale, procedures and potential hazards Date Initiated: 09/17/2021 T arget Resolution Date: 01/07/2022 Goal Status: Active Signs and symptoms of pulmonary oxygen toxicity will be recognized and promptly addressed Date Initiated: 09/17/2021 T arget Resolution Date: 01/07/2022 Goal Status: Active Signs and symptoms of seizure will be recognized and promptly addressed ; seizing patients will suffer no harm Date Initiated: 09/17/2021 T arget Resolution Date: 01/07/2022 Goal Status: Active Interventions: Administer decongestants, per physician orders, prior to HBO2 Administer the correct therapeutic gas delivery based on the patients needs and limitations, per physician order Assess and provide for patients comfort related to the hyperbaric environment and equalization of middle ear Assess for signs and symptoms related to adverse events, including but not limited to confinement anxiety, pneumothorax, oxygen toxicity and baurotrauma Assess patient for any history of confinement anxiety Assess patient's knowledge and expectations regarding hyperbaric medicine and provide education related to the hyperbaric environment, goals of treatment and prevention of adverse events Implement protocols to decrease risk of pneumothorax in high risk patients Notes: Nutrition Nursing Diagnoses: Impaired glucose control: actual or potential Goals: Patient/caregiver verbalizes understanding of need to  maintain therapeutic glucose control per primary care physician Date Initiated: 08/25/2021 Target Resolution Date: 01/07/2022 Goal Status: Active Interventions: Assess HgA1c results as ordered upon admission and as needed Provide education on  elevated blood sugars and impact on wound healing Notes: Osteomyelitis Nursing Diagnoses: Infection: osteomyelitis Knowledge deficit related to disease process and management Goals: Patient's osteomyelitis will resolve Date Initiated: 09/17/2021 Target Resolution Date: 01/07/2022 Goal Status: Active Interventions: Assess for signs and symptoms of osteomyelitis resolution every visit Provide education on osteomyelitis Treatment Activities: Surgical debridement : 09/17/2021 Systemic antibiotics : 09/17/2021 T ordered outside of clinic : 09/17/2021 est Notes: Wound/Skin Impairment Nursing Diagnoses: Impaired tissue integrity Goals: Patient/caregiver will verbalize understanding of skin care regimen Date Initiated: 08/25/2021 Target Resolution Date: 01/07/2022 Goal Status: Active Ulcer/skin breakdown will have a volume reduction of 30% by week 4 Date Initiated: 08/25/2021 Date Inactivated: 10/08/2021 Target Resolution Date: 10/20/2021 Goal Status: Met Interventions: Assess patient/caregiver ability to obtain necessary supplies Assess patient/caregiver ability to perform ulcer/skin care regimen upon admission and as needed Assess ulceration(s) every visit Provide education on ulcer and skin care Treatment Activities: Topical wound management initiated : 08/25/2021 Notes: Electronic Signature(s) Signed: 12/31/2021 5:26:28 PM By: Adline Peals Entered By: Adline Peals on 12/31/2021 11:59:15 -------------------------------------------------------------------------------- Pain Assessment Details Patient Name: Date of Service: Colin Loveless A. 12/31/2021 12:45 PM Medical Record Number: [id number] Patient Account Number: [id number] Date of Birth/Sex: Treating RN: Nov 16, 1955 (66 y.o. [name] Primary Care Yvaine Jankowiak: [name] Other Clinician: Referring Feleshia Zundel: Treating Arlene Brickel/Extender: [name] Weeks in Treatment: 18 Active  Problems Location of Pain Severity and Description of Pain Patient Has Paino No Site Locations Rate the pain. Current Pain Level: 0 Pain Management and Medication Current Pain Management: Electronic Signature(s) Signed: 12/31/2021 5:26:28 PM By: Adline Peals Entered By: Adline Peals on 12/31/2021 11:53:41 -------------------------------------------------------------------------------- Patient/Caregiver Education Details Patient Name: Date of Service: [name] 7/21/2023andnbsp12:45 PM Medical Record Number: [id number] Patient Account Number: [id number] Date of Birth/Gender: Treating RN: July 06, 1955 (66 y.o. [name] Primary Care Physician: [name] Other Clinician: Referring Physician: Treating Physician/Extender: [name] in Treatment: 18 Education Assessment Education Provided To: Patient Education Topics Provided Wound/Skin Impairment: Methods: Explain/Verbal Responses: Reinforcements needed, State content correctly Electronic Signature(s) Signed: 12/31/2021 5:26:28 PM By: Adline Peals Entered By: Adline Peals on 12/31/2021 11:59:31 -------------------------------------------------------------------------------- Wound Assessment Details Patient Name: Date of Service: Colin Loveless A. 12/31/2021 12:45 PM Medical Record Number: [id number] Patient Account Number: [id number] Date of Birth/Sex: Treating RN: 1955-06-23 (66 y.o. [name] Primary Care Arliss Frisina: [name] Other Clinician: Referring Olla Delancey: Treating Kanoelani Dobies/Extender: [name] Weeks in Treatment: 18 Wound Status Wound Number: 1 Primary Diabetic Wound/Ulcer of the Lower Extremity Etiology: Wound Location: Left, Medial Foot Wound Open Wounding Event: Gradually Appeared Status: Date Acquired: 02/22/2021 Comorbid Cataracts, Coronary Artery Disease, Hypertension, Myocardial Weeks Of Treatment:  18 History: Infarction, Peripheral Arterial Disease, Type II Diabetes, Clustered Wound: No Osteomyelitis, Neuropathy Photos Wound Measurements Length: (cm) 6.3 Width: (cm) 2.2 Depth: (cm) 0.2 Area: (cm) 10.886 Volume: (cm) 2.177 % Reduction in Area: 23% % Reduction in Volume: 84.6% Epithelialization: Small (1-33%) Tunneling: Yes Position (o'clock): 12 Maximum Distance: (cm) 2.1 Undermining: No Wound Description Classification: Grade 3 Wound Margin: Distinct, outline attached Exudate Amount: Medium Exudate Type: Serosanguineous Exudate Color: red, brown Foul Odor After Cleansing: No Slough/Fibrino Yes Wound Bed Granulation Amount: Large (67-100%) Exposed Structure Granulation Quality: Red, Hyper-granulation Fascia Exposed: No Necrotic Amount:  Small (1-33%) Fat Layer (Subcutaneous Tissue) Exposed: Yes Necrotic Quality: Adherent Slough Tendon Exposed: No Muscle Exposed: No Joint Exposed: No Bone Exposed: No Treatment Notes Wound #1 (Foot) Wound Laterality: Left, Medial Cleanser Wound Cleanser Discharge Instruction: Cleanse the wound with wound cleanser prior to applying a clean dressing using gauze sponges, not tissue or cotton balls. Peri-Wound Care Skin Prep Discharge Instruction: Use skin prep as directed Zinc Oxide Ointment 30g tube Discharge Instruction: Apply Zinc Oxide to periwound with each dressing change Topical Primary Dressing Promogran Prisma Matrix, 4.34 (sq in) (silver collagen) Discharge Instruction: Moisten collagen with saline or hydrogel Secondary Dressing ABD Pad, 5x9 Discharge Instruction: In clinic. Home Health to apply wound vac. Woven Gauze Sponge, Non-Sterile 4x4 in Discharge Instruction: In clinic. Home Health to apply wound vac. Secured With [employer], 4.5x3.1 (in/yd) Discharge Instruction: In clinic. Home Health to apply wound vac. 70M Medipore H Soft Cloth Surgical T ape, 4 x 10 (in/yd) Discharge Instruction: In clinic.  Home Health to apply wound vac. Compression Wrap Compression Stockings Add-Ons Electronic Signature(s) Signed: 12/31/2021 5:26:28 PM By: Adline Peals Entered By: Adline Peals on 12/31/2021 12:00:41 -------------------------------------------------------------------------------- Vitals Details Patient Name: Date of Service: [name] 12/31/2021 12:45 PM Medical Record Number: [id number] Patient Account Number: [id number] Date of Birth/Sex: Treating RN: [birth year] (66 y.o. [name] Primary Care Xariah Silvernail: [name] Other Clinician: Referring Shaquina Gillham: Treating Torin Modica/Extender: [name] Weeks in Treatment: 18 Vital Signs Time Taken: 11:53 Temperature (F): 97.3 Height (in): 74 Pulse (bpm): 79 Weight (lbs): 186 Respiratory Rate (breaths/min): 18 Body Mass Index (BMI): 23.9 Blood Pressure (mmHg): 161/92 Capillary Blood Glucose (mg/dl): 211 Reference Range: 80 - 120 mg / dl Electronic Signature(s) Signed: 12/31/2021 5:26:28 PM By: Adline Peals Entered By: Adline Peals on 12/31/2021 11:53:35

## 2021-12-31 NOTE — Progress Notes (Addendum)
TICO, CROTTEAU ([id number]) Visit Report for 12/31/2021 HBO Details Patient Name: Date of Service: NA [name] 12/31/2021 10:00 A M Medical Record Number: [id number] Patient Account Number: [id number] Date of Birth/Sex: Treating RN: [birth year] (66 y.o. [name] Primary Care [name]: [name] Other Clinician: [name] Referring [name]: Treating Dymond Gutt/Extender: [name]: 18 HBO Treatment Course Details Treatment Course Number: 1 Ordering Sadhana Frater: [name] T Treatments Ordered: otal 80 HBO Treatment Start Date: 09/20/2021 HBO Indication: Diabetic Ulcer(s) of the Lower Extremity Standard/Conservative Wound Care tried and failed greater than or equal to 30 days HBO Treatment Details Treatment Number: 70 Patient Type: Outpatient Chamber Type: Monoplace Chamber Serial #: [id number] Treatment Protocol: 2.0 ATA with 90 minutes oxygen, with two 5 minute air breaks Treatment Details Compression Rate Down: 2.0 psi / minute De-Compression Rate Up: A breaks and breathing ir Compress Tx Pressure periods Decompress Decompress Begins Reached (leave unused spaces Begins Ends blank) Chamber Pressure (ATA 1 2 2 2 2 2  --2 1 ) Clock Time (24 hr) 09:40 09:54 10:24 10:29 11:59 11:04 - - 11:34 11:46 Treatment Length: 126 (minutes) Treatment Segments: 4 Vital Signs Capillary Blood Glucose Reference Range: 80 - 120 mg / dl HBO Diabetic Blood Glucose Intervention Range: <131 mg/dl or mg/dl Time Vitals Blood Respiratory Capillary Blood Glucose Pulse Action Type: Pulse: Temperature: Taken: Pressure: Rate: Glucose (mg/dl): Meter #: Oximetry (%) Taken: Pre 09:36 120/81 92 18 97.3 277 Post 11:48 161/92 79 18 97.3 211 Treatment Response Treatment Toleration: Well Treatment Completion Status: Treatment Completed without Adverse Event Additional Procedure Documentation Tissue Sevierity: Necrosis of bone Physician HBO  Attestation: I certify that I supervised this HBO treatment in accordance with Medicare guidelines. A trained emergency response team is readily available per Yes hospital policies and procedures. Continue HBOT as ordered. Yes Electronic Signature(s) Signed: 12/31/2021 2:59:24 PM By: 01/02/2022 MD FACS Previous Signature: 12/31/2021 12:01:02 PM Version By: 01/02/2022 EMT Entered By: [name] on 12/31/2021 14:59:23 -------------------------------------------------------------------------------- HBO Safety Checklist Details Patient Name: Date of Service: 01/02/2022 A. 12/31/2021 10:00 A M Medical Record Number: 01/02/2022 Patient Account Number: [id number] Date of Birth/Sex: Treating RN: 29-Sep-1955 (66 y.o. 77 Primary Care Landrie Beale: [name] Other Clinician: [name] Referring [name]: Treating Walter Min/Extender: [name]: 18 HBO Safety Checklist Items Safety Checklist Consent Form Signed Patient voided / foley secured and emptied When did you last eato 0700 Last dose of injectable or oral agent 0700 Ostomy pouch emptied and vented if applicable NA All implantable devices assessed, documented and approved NA Intravenous access site secured and place NA Valuables secured Linens and cotton and cotton/polyester blend (less than 51% polyester) Personal oil-based products / skin lotions / body lotions removed Wigs or hairpieces removed NA Smoking or tobacco materials removed Books / newspapers / magazines / loose paper removed Cologne, aftershave, perfume and deodorant removed Jewelry removed (may wrap wedding band) NA Make-up removed NA Hair care products removed Battery operated devices (external) removed Heating patches and chemical warmers removed Titanium eyewear removed NA Nail polish cured greater than 10 hours NA Casting material cured greater than 10 hours NA Hearing aids  removed NA Loose dentures or partials removed NA Prosthetics have been removed NA Patient demonstrates correct use of air break device (if applicable) Patient concerns have been addressed Patient grounding bracelet on and cord attached to chamber Specifics for Inpatients (complete in addition to above)  Medication sheet sent with patient NA Intravenous medications needed or due during therapy sent with patient NA Drainage tubes (e.g. nasogastric tube or chest tube secured and vented) NA Endotracheal or Tracheotomy tube secured NA Cuff deflated of air and inflated with saline NA Airway suctioned NA Notes The safety checklist was done before treatment started. Electronic Signature(s) Signed: 12/31/2021 11:06:59 AM By: [name] EMT Entered By: [name] on 12/31/2021 11:06:59

## 2022-01-01 DIAGNOSIS — T8131XA Disruption of external operation (surgical) wound, not elsewhere classified, initial encounter: Secondary | ICD-10-CM | POA: Diagnosis not present

## 2022-01-02 DIAGNOSIS — T8131XA Disruption of external operation (surgical) wound, not elsewhere classified, initial encounter: Secondary | ICD-10-CM | POA: Diagnosis not present

## 2022-01-03 ENCOUNTER — Encounter (HOSPITAL_BASED_OUTPATIENT_CLINIC_OR_DEPARTMENT_OTHER): Payer: BC Managed Care – PPO | Admitting: General Surgery

## 2022-01-03 DIAGNOSIS — I2510 Atherosclerotic heart disease of native coronary artery without angina pectoris: Secondary | ICD-10-CM | POA: Diagnosis not present

## 2022-01-03 DIAGNOSIS — Z794 Long term (current) use of insulin: Secondary | ICD-10-CM | POA: Diagnosis not present

## 2022-01-03 DIAGNOSIS — Z9181 History of falling: Secondary | ICD-10-CM | POA: Diagnosis not present

## 2022-01-03 DIAGNOSIS — D649 Anemia, unspecified: Secondary | ICD-10-CM | POA: Diagnosis not present

## 2022-01-03 DIAGNOSIS — M1990 Unspecified osteoarthritis, unspecified site: Secondary | ICD-10-CM | POA: Diagnosis not present

## 2022-01-03 DIAGNOSIS — Z7982 Long term (current) use of aspirin: Secondary | ICD-10-CM | POA: Diagnosis not present

## 2022-01-03 DIAGNOSIS — Z951 Presence of aortocoronary bypass graft: Secondary | ICD-10-CM | POA: Diagnosis not present

## 2022-01-03 DIAGNOSIS — T8131XA Disruption of external operation (surgical) wound, not elsewhere classified, initial encounter: Secondary | ICD-10-CM | POA: Diagnosis not present

## 2022-01-03 DIAGNOSIS — M86672 Other chronic osteomyelitis, left ankle and foot: Secondary | ICD-10-CM | POA: Diagnosis not present

## 2022-01-03 DIAGNOSIS — I252 Old myocardial infarction: Secondary | ICD-10-CM | POA: Diagnosis not present

## 2022-01-03 DIAGNOSIS — L97322 Non-pressure chronic ulcer of left ankle with fat layer exposed: Secondary | ICD-10-CM | POA: Diagnosis not present

## 2022-01-03 DIAGNOSIS — E785 Hyperlipidemia, unspecified: Secondary | ICD-10-CM | POA: Diagnosis not present

## 2022-01-03 DIAGNOSIS — L97324 Non-pressure chronic ulcer of left ankle with necrosis of bone: Secondary | ICD-10-CM | POA: Diagnosis not present

## 2022-01-03 DIAGNOSIS — Z7984 Long term (current) use of oral hypoglycemic drugs: Secondary | ICD-10-CM | POA: Diagnosis not present

## 2022-01-03 DIAGNOSIS — E11622 Type 2 diabetes mellitus with other skin ulcer: Secondary | ICD-10-CM | POA: Diagnosis not present

## 2022-01-03 DIAGNOSIS — E11610 Type 2 diabetes mellitus with diabetic neuropathic arthropathy: Secondary | ICD-10-CM | POA: Diagnosis not present

## 2022-01-03 DIAGNOSIS — I10 Essential (primary) hypertension: Secondary | ICD-10-CM | POA: Diagnosis not present

## 2022-01-03 DIAGNOSIS — E1140 Type 2 diabetes mellitus with diabetic neuropathy, unspecified: Secondary | ICD-10-CM | POA: Diagnosis not present

## 2022-01-03 DIAGNOSIS — E11621 Type 2 diabetes mellitus with foot ulcer: Secondary | ICD-10-CM | POA: Diagnosis not present

## 2022-01-03 DIAGNOSIS — K219 Gastro-esophageal reflux disease without esophagitis: Secondary | ICD-10-CM | POA: Diagnosis not present

## 2022-01-03 LAB — GLUCOSE, CAPILLARY
Glucose-Capillary: 171 mg/dL — ABNORMAL HIGH (ref 70–99)
Glucose-Capillary: 184 mg/dL — ABNORMAL HIGH (ref 70–99)

## 2022-01-03 NOTE — Progress Notes (Signed)
ARNETT, DUDDY ([id number]) Visit Report for 01/03/2022 Arrival Information Details Patient Name: Date of Service: NA [name] 01/03/2022 10:00 A M Medical Record Number: [id number] Patient Account Number: [id number] Date of Birth/Sex: Treating RN: 31-Aug-1955 (66 y.o. [name] Primary Care Gar Glance: [name] Other Clinician: [name] Referring [place]ra Hair: Treating Zakk Borgen/Extender: [name] in Treatment: 18 Visit Information History Since Last Visit All ordered tests and consults were completed: No Patient Arrived: Knee Scooter Added or deleted any medications: No Arrival Time: 09:25 Any new allergies or adverse reactions: No Accompanied By: self Had a fall or experienced change in No Transfer Assistance: None activities of daily living that may affect Patient Identification Verified: Yes risk of falls: Secondary Verification Process Completed: Yes Signs or symptoms of abuse/neglect since last visito No Patient Requires Transmission-Based Precautions: No Hospitalized since last visit: No Patient Has Alerts: Yes Implantable device outside of the clinic excluding No Patient Alerts: Patient on Blood Thinner cellular tissue based products placed in the center since last visit: Pain Present Now: No Electronic Signature(s) Signed: 01/03/2022 12:09:57 PM By: [name] CHT EMT BS , , Entered By: [name] on 01/03/2022 12:09:56 -------------------------------------------------------------------------------- Encounter Discharge Information Details Patient Name: Date of Service: Colin Fanning A. 01/03/2022 10:00 A M Medical Record Number: [id number] Patient Account Number: [id number] Date of Birth/Sex: Treating RN: Mar 27, 1956 (66 y.o. [name] Primary Care Feven Alderfer: [name] Other Clinician: [name] Referring Henery Betzold: Treating Nyshaun Standage/Extender: [name] in  Treatment: 18 Encounter Discharge Information Items Discharge Condition: Stable Ambulatory Status: Knee Scooter Discharge Destination: Home Transportation: Private Auto Accompanied By: self Schedule Follow-up Appointment: No Clinical Summary of Care: Electronic Signature(s) Signed: 01/03/2022 12:09:41 PM By: [name] CHT EMT BS , , Entered By: [name] on 01/03/2022 12:09:41 -------------------------------------------------------------------------------- Vitals Details Patient Name: Date of Service: NA Colin Nephew A. 01/03/2022 10:00 A M Medical Record Number: [id number] Patient Account Number: [id number] Date of Birth/Sex: Treating RN: 1956/02/07 (66 y.o. [name], [name] Primary Care Avamae Dehaan: [name] Other Clinician: [name] Referring Allyanna Appleman: Treating Rubens Cranston/Extender: [name]: 18 Vital Signs Time Taken: 09:39 Temperature (F): 98.0 Height (in): 74 Pulse (bpm): 82 Weight (lbs): 186 Respiratory Rate (breaths/min): 18 Body Mass Index (BMI): 23.9 Blood Pressure (mmHg): 115/82 Capillary Blood Glucose (mg/dl): 824 Reference Range: 80 - 120 mg / dl Electronic Signature(s) Signed: 01/03/2022 12:11:18 PM By: [name] CHT EMT BS , , Entered By: [name] on 01/03/2022 12:11:18

## 2022-01-03 NOTE — Progress Notes (Signed)
RAJU, COPPOLINO ([id number]) Visit Report for 01/03/2022 SuperBill Details Patient Name: Date of Service: NA [name] 01/03/2022 Medical Record Number: [id number] Patient Account Number: [id number] Date of Birth/Sex: Treating RN: 09/21/1955 (66 y.o. 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 Facility Procedures CPT4 Code Description Modifier Quantity [id number] G0277-(Facility Use Only) HBOT full body chamber, , 4 ICD-10 Diagnosis Description E11.621 Type 2 diabetes mellitus with foot ulcer L97.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 M86.672 Other chronic osteomyelitis, left ankle and foot 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

## 2022-01-04 ENCOUNTER — Encounter (HOSPITAL_BASED_OUTPATIENT_CLINIC_OR_DEPARTMENT_OTHER): Payer: BC Managed Care – PPO | Admitting: General Surgery

## 2022-01-04 DIAGNOSIS — I2510 Atherosclerotic heart disease of native coronary artery without angina pectoris: Secondary | ICD-10-CM | POA: Diagnosis not present

## 2022-01-04 DIAGNOSIS — L97324 Non-pressure chronic ulcer of left ankle with necrosis of bone: Secondary | ICD-10-CM | POA: Diagnosis not present

## 2022-01-04 DIAGNOSIS — M86672 Other chronic osteomyelitis, left ankle and foot: Secondary | ICD-10-CM | POA: Diagnosis not present

## 2022-01-04 DIAGNOSIS — T8131XA Disruption of external operation (surgical) wound, not elsewhere classified, initial encounter: Secondary | ICD-10-CM | POA: Diagnosis not present

## 2022-01-04 DIAGNOSIS — E11621 Type 2 diabetes mellitus with foot ulcer: Secondary | ICD-10-CM | POA: Diagnosis not present

## 2022-01-04 DIAGNOSIS — E11610 Type 2 diabetes mellitus with diabetic neuropathic arthropathy: Secondary | ICD-10-CM | POA: Diagnosis not present

## 2022-01-04 LAB — GLUCOSE, CAPILLARY
Glucose-Capillary: 154 mg/dL — ABNORMAL HIGH (ref 70–99)
Glucose-Capillary: 116 mg/dL — ABNORMAL HIGH (ref 70–99)

## 2022-01-04 NOTE — Progress Notes (Addendum)
[name], [name] ([id number]) Visit Report for 01/04/2022 Arrival Information Details Patient Name: Date of Service: NA [name] 01/04/2022 10:00 A M Medical Record Number: [id number] Patient Account Number: [id number] Date of Birth/Sex: Treating RN: June 09, 1956 (66 y.o. [name] Primary Care Preciliano Castell: [name] Other Clinician: [name] Referring Whitley Strycharz: Treating Shantella Blubaugh/Extender: [name] in Treatment: 18 Visit Information History Since Last Visit All ordered tests and consults were completed: No Patient Arrived: Knee Scooter Added or deleted any medications: No Arrival Time: 09:20 Any new allergies or adverse reactions: No Accompanied By: self Had a fall or experienced change in No Transfer Assistance: None activities of daily living that may affect Patient Identification Verified: Yes risk of falls: Secondary Verification Process Completed: Yes Signs or symptoms of abuse/neglect since last visito No Patient Requires Transmission-Based Precautions: No Hospitalized since last visit: No Patient Has Alerts: Yes Implantable device outside of the clinic excluding No Patient Alerts: Patient on Blood Thinner cellular tissue based products placed in the center since last visit: Pain Present Now: No Electronic Signature(s) Signed: 01/04/2022 9:55:15 AM By: [name] CHT EMT BS , , Entered By: [name] on 01/04/2022 09:55:15 -------------------------------------------------------------------------------- Encounter Discharge Information Details Patient Name: Date of Service: NA Colin Nephew A. 01/04/2022 10:00 A M Medical Record Number: [id number] Patient Account Number: [id number] Date of Birth/Sex: Treating RN: [birth year] (66 y.o. [name] Primary Care [name]: [name] Other Clinician: [name] Referring Erika Hussar: Treating Jamiel Goncalves/Extender: [name] in  Treatment: 18 Encounter Discharge Information Items Discharge Condition: Stable Ambulatory Status: Knee Scooter Discharge Destination: Home Transportation: Private Auto Accompanied By: spouse Schedule Follow-up Appointment: No Clinical Summary of Care: Electronic Signature(s) Signed: 01/04/2022 12:18:16 PM By: [name] CHT EMT BS , , Entered By: [name] on 01/04/2022 12:18:16 -------------------------------------------------------------------------------- Vitals Details Patient Name: Date of Service: NA Colin Nephew A. 01/04/2022 10:00 A M Medical Record Number: [id number] Patient Account Number: [id number] Date of Birth/Sex: Treating RN: [birth year] (66 y.o. [name] Primary Care Miyo Aina: [name] Other Clinician: [name] Referring [name]: Treating Cassady Stanczak/Extender: [name]: 18 [name] Signs Time Taken: 09:34 Temperature (F): 98.2 Height (in): 74 Pulse (bpm): 82 Weight (lbs): 186 Respiratory Rate (breaths/min): 18 Body Mass Index (BMI): 23.9 Blood Pressure (mmHg): 126/85 Capillary Blood Glucose (mg/dl): 412 Reference Range: 80 - 120 mg / dl Electronic Signature(s) Signed: 01/04/2022 9:55:51 AM By: [name] CHT EMT BS , , Entered By: [name] on 01/04/2022 09:55:51

## 2022-01-04 NOTE — Progress Notes (Signed)
BALIN, VANDEGRIFT ([id number]) Visit Report for 01/04/2022 SuperBill Details Patient Name: Date of Service: NA [name] 01/04/2022 Medical Record Number: [id number] Patient Account Number: [id number] Date of Birth/Sex: Treating RN: January 01, 1956 (66 y.o. 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 Facility Procedures CPT4 Code Description Modifier Quantity [id number] G0277-(Facility Use Only) HBOT full body chamber, , 4 ICD-10 Diagnosis Description E11.621 Type 2 diabetes mellitus with foot ulcer 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 M86.672 Other chronic osteomyelitis, left ankle and foot 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

## 2022-01-04 NOTE — Progress Notes (Addendum)
MARQUEZ, CEESAY ([id number]) Visit Report for 01/04/2022 HBO Details Patient Name: Date of Service: NA [name] 01/04/2022 10:00 A M Medical Record Number: [id number] Patient Account Number: [id number] Date of Birth/Sex: Treating RN: [birth year] (66 y.o. [name] Primary Care Virginia Curl: [name] Other Clinician: [name] Referring Elfie Costanza: Treating Matisse Salais/Extender: [name]: 18 HBO Treatment Course Details Treatment Course Number: 1 Ordering Fender Herder: [name] T Treatments Ordered: otal 80 HBO Treatment Start Date: 09/20/2021 HBO Indication: Diabetic Ulcer(s) of the Lower Extremity Standard/Conservative Wound Care tried and failed greater than or equal to 30 days HBO Treatment Details Treatment Number: 72 Patient Type: Outpatient Chamber Type: Monoplace Chamber Serial #: [id number] Treatment Protocol: 2.0 ATA with 90 minutes oxygen, with two 5 minute air breaks Treatment Details Compression Rate Down: 2.0 psi / minute De-Compression Rate Up: 2.0 psi / minute A breaks and breathing ir Compress Tx Pressure periods Decompress Decompress Begins Reached (leave unused spaces Begins Ends blank) Chamber Pressure (ATA 1 2 2 2 2 2  --2 1 ) Clock Time (24 hr) 09:39 09:52 10:22 10:27 10:57 11:02 - - 11:32 11:43 Treatment Length: 124 (minutes) Treatment Segments: 4 Vital Signs Capillary Blood Glucose Reference Range: 80 - 120 mg / dl HBO Diabetic Blood Glucose Intervention Range: <131 mg/dl or mg/dl Type: Time Vitals Blood Respiratory Capillary Blood Glucose Pulse Action Pulse: Temperature: Taken: Pressure: Rate: Glucose (mg/dl): Meter #: Oximetry (%) Taken: Pre 09:34 126/85 82 18 98.2 154 none per protocol Post 11:43 141/98 74 18 97.9 116 none per protocol Treatment Response Treatment Toleration: Well Treatment Completion Status: Treatment Completed without Adverse Event Additional Procedure  Documentation Tissue Sevierity: Necrosis of bone Physician HBO Attestation: I certify that I supervised this HBO treatment in accordance with Medicare guidelines. A trained emergency response team is readily available per Yes hospital policies and procedures. Continue HBOT as ordered. Yes Electronic Signature(s) Signed: 01/04/2022 12:52:35 PM By: 01/06/2022 MD FACS Previous Signature: 01/04/2022 12:17:08 PM Version By: 01/06/2022 CHT EMT BS , , Previous Signature: 01/04/2022 9:57:58 AM Version By: 01/06/2022 CHT EMT BS , , Previous Signature: 01/04/2022 9:57:46 AM Version By: 01/06/2022 CHT EMT BS , , Entered By: [name] on 01/04/2022 12:52:34 -------------------------------------------------------------------------------- HBO Safety Checklist Details Patient Name: Date of Service: 01/06/2022 A. 01/04/2022 10:00 A M Medical Record Number: 01/06/2022 Patient Account Number: [id number] Date of Birth/Sex: Treating RN: 05/06/1956 (66 y.o. 77 Primary Care Anzleigh Slaven: [name] Other Clinician: [name] Referring Maliq Pilley: Treating Abbagayle Zaragoza/Extender: [name] Weeks in Treatment: 18 HBO Safety Checklist Items Safety Checklist Consent Form Signed Patient voided / foley secured and emptied When did you last eato 0715 Last dose of injectable or oral agent 0715 Ostomy pouch emptied and vented if applicable NA All implantable devices assessed, documented and approved NA Intravenous access site secured and place NA Valuables secured Linens and cotton and cotton/polyester blend (less than 51% polyester) Personal oil-based products / skin lotions / body lotions removed Wigs or hairpieces removed NA Smoking or tobacco materials removed NA Books / newspapers / magazines / loose paper removed Cologne, aftershave, perfume and deodorant removed Jewelry removed (may wrap wedding band) Make-up  removed NA Hair care products removed Battery operated devices (external) removed Heating patches and chemical warmers removed Titanium eyewear removed NA Nail polish cured greater than 10 hours NA Casting material cured greater than 10 hours NA Hearing aids removed NA Loose  dentures or partials removed NA Prosthetics have been removed NA Patient demonstrates correct use of air break device (if applicable) Patient concerns have been addressed Patient grounding bracelet on and cord attached to chamber Specifics for Inpatients (complete in addition to above) Medication sheet sent with patient NA Intravenous medications needed or due during therapy sent with patient NA Drainage tubes (e.g. nasogastric tube or chest tube secured and vented) NA Endotracheal or Tracheotomy tube secured NA Cuff deflated of air and inflated with saline NA Airway suctioned NA Notes Paper version used prior to treatment. Electronic Signature(s) Signed: 01/04/2022 9:57:11 AM By: [name] CHT EMT BS , , Entered By: [name] on 01/04/2022 09:57:11

## 2022-01-05 ENCOUNTER — Encounter (HOSPITAL_BASED_OUTPATIENT_CLINIC_OR_DEPARTMENT_OTHER): Payer: BC Managed Care – PPO | Admitting: General Surgery

## 2022-01-05 DIAGNOSIS — E11622 Type 2 diabetes mellitus with other skin ulcer: Secondary | ICD-10-CM | POA: Diagnosis not present

## 2022-01-05 DIAGNOSIS — Z9181 History of falling: Secondary | ICD-10-CM | POA: Diagnosis not present

## 2022-01-05 DIAGNOSIS — Z7982 Long term (current) use of aspirin: Secondary | ICD-10-CM | POA: Diagnosis not present

## 2022-01-05 DIAGNOSIS — K219 Gastro-esophageal reflux disease without esophagitis: Secondary | ICD-10-CM | POA: Diagnosis not present

## 2022-01-05 DIAGNOSIS — M1990 Unspecified osteoarthritis, unspecified site: Secondary | ICD-10-CM | POA: Diagnosis not present

## 2022-01-05 DIAGNOSIS — Z794 Long term (current) use of insulin: Secondary | ICD-10-CM | POA: Diagnosis not present

## 2022-01-05 DIAGNOSIS — I2510 Atherosclerotic heart disease of native coronary artery without angina pectoris: Secondary | ICD-10-CM | POA: Diagnosis not present

## 2022-01-05 DIAGNOSIS — E11610 Type 2 diabetes mellitus with diabetic neuropathic arthropathy: Secondary | ICD-10-CM | POA: Diagnosis not present

## 2022-01-05 DIAGNOSIS — L97324 Non-pressure chronic ulcer of left ankle with necrosis of bone: Secondary | ICD-10-CM | POA: Diagnosis not present

## 2022-01-05 DIAGNOSIS — E11621 Type 2 diabetes mellitus with foot ulcer: Secondary | ICD-10-CM | POA: Diagnosis not present

## 2022-01-05 DIAGNOSIS — I10 Essential (primary) hypertension: Secondary | ICD-10-CM | POA: Diagnosis not present

## 2022-01-05 DIAGNOSIS — E1140 Type 2 diabetes mellitus with diabetic neuropathy, unspecified: Secondary | ICD-10-CM | POA: Diagnosis not present

## 2022-01-05 DIAGNOSIS — E785 Hyperlipidemia, unspecified: Secondary | ICD-10-CM | POA: Diagnosis not present

## 2022-01-05 DIAGNOSIS — M86672 Other chronic osteomyelitis, left ankle and foot: Secondary | ICD-10-CM | POA: Diagnosis not present

## 2022-01-05 DIAGNOSIS — Z7984 Long term (current) use of oral hypoglycemic drugs: Secondary | ICD-10-CM | POA: Diagnosis not present

## 2022-01-05 DIAGNOSIS — D649 Anemia, unspecified: Secondary | ICD-10-CM | POA: Diagnosis not present

## 2022-01-05 DIAGNOSIS — Z951 Presence of aortocoronary bypass graft: Secondary | ICD-10-CM | POA: Diagnosis not present

## 2022-01-05 DIAGNOSIS — L97322 Non-pressure chronic ulcer of left ankle with fat layer exposed: Secondary | ICD-10-CM | POA: Diagnosis not present

## 2022-01-05 DIAGNOSIS — I252 Old myocardial infarction: Secondary | ICD-10-CM | POA: Diagnosis not present

## 2022-01-05 DIAGNOSIS — T8131XA Disruption of external operation (surgical) wound, not elsewhere classified, initial encounter: Secondary | ICD-10-CM | POA: Diagnosis not present

## 2022-01-05 LAB — GLUCOSE, CAPILLARY
Glucose-Capillary: 163 mg/dL — ABNORMAL HIGH (ref 70–99)
Glucose-Capillary: 137 mg/dL — ABNORMAL HIGH (ref 70–99)

## 2022-01-05 NOTE — Progress Notes (Addendum)
ZOLLIE, ELLERY ([id number]) Visit Report for 01/05/2022 Arrival Information Details Patient Name: Date of Service: NA [name] 01/05/2022 10:00 A M Medical Record Number: [id number] Patient Account Number: [id number] Date of Birth/Sex: Treating RN: 1955/10/06 (66 y.o. [name] Primary Care Vitali Seibert: [name] Other Clinician: [name] Referring Kiri Hinderliter: Treating Alfons Sulkowski/Extender: [name] in Treatment: 19 Visit Information History Since Last Visit All ordered tests and consults were completed: No Patient Arrived: Knee Scooter Added or deleted any medications: No Arrival Time: 09:23 Any new allergies or adverse reactions: No Accompanied By: Wife Had a fall or experienced change in No Transfer Assistance: None activities of daily living that may affect Patient Identification Verified: Yes risk of falls: Secondary Verification Process Completed: Yes Signs or symptoms of abuse/neglect since last visito No Patient Requires Transmission-Based Precautions: No Hospitalized since last visit: No Patient Has Alerts: Yes Implantable device outside of the clinic excluding No Patient Alerts: Patient on Blood Thinner cellular tissue based products placed in the center since last visit: Pain Present Now: No Electronic Signature(s) Signed: 01/05/2022 10:11:12 AM By: [name] EMT Entered By: [name] on 01/05/2022 10:11:11 -------------------------------------------------------------------------------- Encounter Discharge Information Details Patient Name: Date of Service: NA [name] 01/05/2022 10:00 A M Medical Record Number: [id number] Patient Account Number: [id number] Date of Birth/Sex: Treating RN: June 27, 1955 (66 y.o. [name] Primary Care Danylle Ouk: [name] Other Clinician: [name] Referring Bralon Antkowiak: Treating Roxy Filler/Extender: [name]: 19 Encounter  Discharge Information Items Discharge Condition: Stable Ambulatory Status: Knee Scooter Discharge Destination: Home Transportation: Private Auto Accompanied By: None Schedule Follow-up Appointment: Yes Clinical Summary of Care: Electronic Signature(s) Signed: 01/05/2022 1:09:18 PM By: [name] EMT Entered By: [name] on 01/05/2022 13:09:18 -------------------------------------------------------------------------------- Vitals Details Patient Name: Date of Service: NA [name] 01/05/2022 10:00 A M Medical Record Number: [id number] Patient Account Number: [id number] Date of Birth/Sex: Treating RN: February 15, 1956 (66 y.o. [name] Primary Care Jerelyn Trimarco: [name] Other Clinician: [name] Referring Djimon Lundstrom: Treating Tristin Vandeusen/Extender: [name]: 19 Vital Signs Time Taken: 09:36 Temperature (F): 97.9 Height (in): 74 Pulse (bpm): 71 Weight (lbs): 186 Respiratory Rate (breaths/min): 18 Body Mass Index (BMI): 23.9 Blood Pressure (mmHg): 122/81 Capillary Blood Glucose (mg/dl): 789 Reference Range: 80 - 120 mg / dl Electronic Signature(s) Signed: 01/05/2022 10:13:31 AM By: [name] EMT Entered By: [name] on 01/05/2022 10:13:31

## 2022-01-05 NOTE — Progress Notes (Addendum)
[name] ([id number]) Visit Report for 01/05/2022 HBO Details Patient Name: Date of Service: NA [name] 01/05/2022 10:00 A M Medical Record Number: [id number] Patient Account Number: [id number] Date of Birth/Sex: Treating RN: 20-Oct-1955 (66 y.o. [name] Primary Care [name]: [name] Other Clinician: [name] Referring [name]: Treating [name]/Extender: [name] Weeks in Treatment: 19 HBO Treatment Course Details Treatment Course Number: 1 Ordering [name]: [name] T Treatments Ordered: otal 120 HBO Treatment Start Date: 09/20/2021 HBO Indication: Diabetic Ulcer(s) of the Lower Extremity Standard/Conservative Wound Care tried and failed greater than or equal to 30 days HBO Treatment Details Treatment Number: 73 Patient Type: Outpatient Chamber Type: Monoplace Chamber Serial #: [id number] Treatment Protocol: 2.0 ATA with 90 minutes oxygen, with two 5 minute air breaks Treatment Details Compression Rate Down: 2.0 psi / minute De-Compression Rate Up: A breaks and breathing ir Compress Tx Pressure periods Decompress Decompress Begins Reached (leave unused spaces Begins Ends blank) Chamber Pressure (ATA 1 2 2 2 2 2  --2 1 ) Clock Time (24 hr) 09:57 10:16 10:46 10:51 11:22 11:27 - - 11:57 12:07 Treatment Length: 130 (minutes) Treatment Segments: 4 Vital Signs Capillary Blood Glucose Reference Range: 80 - 120 mg / dl HBO Diabetic Blood Glucose Intervention Range: <131 mg/dl or mg/dl Time Vitals Blood Respiratory Capillary Blood Glucose Pulse Action Type: Pulse: Temperature: Taken: Pressure: Rate: Glucose (mg/dl): Meter #: Oximetry (%) Taken: Pre 09:36 122/81 71 18 97.9 163 Post 12:53 164/93 65 16 97 192 Treatment Response Treatment Toleration: Well Treatment Completion Status: Treatment Completed without Adverse Event Additional Procedure Documentation Tissue Sevierity: Necrosis of bone Physician HBO  Attestation: I certify that I supervised this HBO treatment in accordance with Medicare guidelines. A trained emergency response team is readily available per Yes hospital policies and procedures. Continue HBOT as ordered. Yes Electronic Signature(s) Signed: 01/05/2022 2:59:12 PM By: 01/07/2022 MD FACS Previous Signature: 01/05/2022 1:07:56 PM Version By: 01/07/2022 EMT Entered By: [name] on 01/05/2022 14:59:11 -------------------------------------------------------------------------------- HBO Safety Checklist Details Patient Name: Date of Service: 01/07/2022 A. 01/05/2022 10:00 A M Medical Record Number: 01/07/2022 Patient Account Number: [id number] Date of Birth/Sex: Treating RN: 1955/12/07 (66 y.o. 77 Primary Care Arjen Deringer: [name] Other Clinician: [name] Referring Siyah Mault: Treating Jadea Shiffer/Extender: [name] Weeks in Treatment: 19 HBO Safety Checklist Items Safety Checklist Consent Form Signed Patient voided / foley secured and emptied When did you last eato 0700 Last dose of injectable or oral agent 0700 Ostomy pouch emptied and vented if applicable NA All implantable devices assessed, documented and approved NA Intravenous access site secured and place NA Valuables secured Linens and cotton and cotton/polyester blend (less than 51% polyester) Personal oil-based products / skin lotions / body lotions removed Wigs or hairpieces removed NA Smoking or tobacco materials removed Books / newspapers / magazines / loose paper removed Cologne, aftershave, perfume and deodorant removed Jewelry removed (may wrap wedding band) NA Make-up removed NA Hair care products removed Battery operated devices (external) removed Heating patches and chemical warmers removed Titanium eyewear removed NA Nail polish cured greater than 10 hours NA Casting material cured greater than 10 hours NA Hearing aids  removed NA Loose dentures or partials removed NA Prosthetics have been removed NA Patient demonstrates correct use of air break device (if applicable) Patient concerns have been addressed Patient grounding bracelet on and cord attached to chamber Specifics for Inpatients (complete in addition to above)  Medication sheet sent with patient NA Intravenous medications needed or due during therapy sent with patient NA Drainage tubes (e.g. nasogastric tube or chest tube secured and vented) NA Endotracheal or Tracheotomy tube secured NA Cuff deflated of air and inflated with saline NA Airway suctioned NA Notes The safety checklist was done before the treatment was started. Electronic Signature(s) Signed: 01/05/2022 10:16:49 AM By: [name] EMT Entered By: [name] on 01/05/2022 10:16:49

## 2022-01-05 NOTE — Progress Notes (Signed)
MARL, SEAGO ([id number]) Visit Report for 01/05/2022 SuperBill Details Patient Name: Date of Service: NA [name] 01/05/2022 Medical Record Number: [id number] Patient Account Number: [id number] Date of Birth/Sex: Treating RN: 1955-09-19 (66 y.o. 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 Facility Procedures CPT4 Code Description Modifier Quantity [id number] G0277-(Facility Use Only) HBOT full body chamber, , 4 ICD-10 Diagnosis Description E11.621 Type 2 diabetes mellitus with foot ulcer 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 M86.672 Other chronic osteomyelitis, left ankle and foot 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

## 2022-01-05 NOTE — Progress Notes (Signed)
[name] ([id number]) Visit Report for 01/03/2022 HBO Details Patient Name: Date of Service: NA [name] 01/03/2022 10:00 A M Medical Record Number: [id number] Patient Account Number: [id number] Date of Birth/Sex: Treating RN: [birth year] (66 y.o. [name], [name] Primary Care Jamieka Royle: [name] Other Clinician: [name] Referring Thaddeaus Monica: Treating Damaree Sargent/Extender: [name]: 18 HBO Treatment Course Details Treatment Course Number: 1 Ordering Goerge Mohr: [name] T Treatments Ordered: otal 80 HBO Treatment Start Date: 09/20/2021 HBO Indication: Diabetic Ulcer(s) of the Lower Extremity Standard/Conservative Wound Care tried and failed greater than or equal to 30 days HBO Treatment Details Treatment Number: 71 Patient Type: Outpatient Chamber Type: Monoplace Chamber Serial #: [id number] Treatment Protocol: 2.0 ATA with 90 minutes oxygen, with two 5 minute air breaks Treatment Details Compression Rate Down: 2.0 psi / minute De-Compression Rate Up: 2.0 psi / minute A breaks and breathing ir Compress Tx Pressure periods Decompress Decompress Begins Reached (leave unused spaces Begins Ends blank) Chamber Pressure (ATA 1 2 2 2 2 2  --2 1 ) Clock Time (24 hr) 09:43 09:56 10:26 10:31 11:00 11:05 - - 11:35 11:46 Treatment Length: 123 (minutes) Treatment Segments: 4 Vital Signs Capillary Blood Glucose Reference Range: 80 - 120 mg / dl HBO Diabetic Blood Glucose Intervention Range: <131 mg/dl or mg/dl Type: Time Vitals Blood Respiratory Capillary Blood Glucose Pulse Action Pulse: Temperature: Taken: Pressure: Rate: Glucose (mg/dl): Meter #: Oximetry (%) Taken: Pre 09:39 115/82 82 18 98 171 1 none per protocol Post 11:49 164/92 73 18 97.9 184 1 none per protocol Treatment Response Treatment Toleration: Well Treatment Completion Status: Treatment Completed without Adverse Event Additional Procedure  Documentation Tissue Sevierity: Necrosis of bone Physician HBO Attestation: I certify that I supervised this HBO treatment in accordance with Medicare guidelines. A trained emergency response team is readily available per Yes hospital policies and procedures. Continue HBOT as ordered. Yes Electronic Signature(s) Signed: 01/03/2022 3:40:43 PM By: 01/05/2022 MD FACS Previous Signature: 01/03/2022 12:08:24 PM Version By: 01/05/2022 CHT EMT BS , , Previous Signature: 01/03/2022 12:07:59 PM Version By: 01/05/2022 CHT EMT BS , , Entered By: [name] on 01/03/2022 15:40:43 -------------------------------------------------------------------------------- HBO Safety Checklist Details Patient Name: Date of Service: 01/05/2022 A. 01/03/2022 10:00 A M Medical Record Number: 01/05/2022 Patient Account Number: [id number] Date of Birth/Sex: Treating RN: [name] (66 y.o. 79, [name] Primary Care Sawyer Mentzer: [name] Other Clinician: [name] Referring [name]: Treating Mccoy Testa/Extender: [name] Weeks in Treatment: 18 HBO Safety Checklist Items Safety Checklist Consent Form Signed Patient voided / foley secured and emptied When did you last eato 0645 Last dose of injectable or oral agent 0645 Ostomy pouch emptied and vented if applicable NA All implantable devices assessed, documented and approved NA Intravenous access site secured and place NA Valuables secured Linens and cotton and cotton/polyester blend (less than 51% polyester) Personal oil-based products / skin lotions / body lotions removed Wigs or hairpieces removed NA Smoking or tobacco materials removed NA Books / newspapers / magazines / loose paper removed Cologne, aftershave, perfume and deodorant removed Jewelry removed (may wrap wedding band) Make-up removed NA Hair care products removed Battery operated devices (external) removed Heating patches  and chemical warmers removed Titanium eyewear removed NA Nail polish cured greater than 10 hours NA Casting material cured greater than 10 hours NA Hearing aids removed NA Loose dentures or partials removed NA Prosthetics have been removed NA Patient demonstrates  correct use of air break device (if applicable) Patient concerns have been addressed Patient grounding bracelet on and cord attached to chamber Specifics for Inpatients (complete in addition to above) Medication sheet sent with patient NA Intravenous medications needed or due during therapy sent with patient NA Drainage tubes (e.g. nasogastric tube or chest tube secured and vented) NA Endotracheal or Tracheotomy tube secured NA Cuff deflated of air and inflated with saline NA Airway suctioned NA Notes Paper version used prior to treatment. Electronic Signature(s) Signed: 01/05/2022 5:39:06 PM By: [name] CHT EMT BS , , Entered By: [name] on 01/03/2022 10:05:29

## 2022-01-05 NOTE — Progress Notes (Signed)
DAM, ASHRAF ([id number]) Visit Report for 12/29/2021 SuperBill Details Patient Name: Date of Service: NA [name] 12/29/2021 Medical Record Number: [id number] Patient Account Number: [id number] Date of Birth/Sex: Treating RN: [birth year] (66 y.o. 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 Facility Procedures CPT4 Code Description Modifier Quantity [id number] G0277-(Facility Use Only) HBOT full body chamber, , 4 ICD-10 Diagnosis Description E11.621 Type 2 diabetes mellitus with foot ulcer 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 M86.672 Other chronic osteomyelitis, left ankle and foot 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

## 2022-01-06 ENCOUNTER — Encounter (HOSPITAL_BASED_OUTPATIENT_CLINIC_OR_DEPARTMENT_OTHER): Payer: BC Managed Care – PPO | Admitting: General Surgery

## 2022-01-06 DIAGNOSIS — L97324 Non-pressure chronic ulcer of left ankle with necrosis of bone: Secondary | ICD-10-CM | POA: Diagnosis not present

## 2022-01-06 DIAGNOSIS — E11610 Type 2 diabetes mellitus with diabetic neuropathic arthropathy: Secondary | ICD-10-CM | POA: Diagnosis not present

## 2022-01-06 DIAGNOSIS — I2510 Atherosclerotic heart disease of native coronary artery without angina pectoris: Secondary | ICD-10-CM | POA: Diagnosis not present

## 2022-01-06 DIAGNOSIS — T8131XA Disruption of external operation (surgical) wound, not elsewhere classified, initial encounter: Secondary | ICD-10-CM | POA: Diagnosis not present

## 2022-01-06 DIAGNOSIS — E11621 Type 2 diabetes mellitus with foot ulcer: Secondary | ICD-10-CM | POA: Diagnosis not present

## 2022-01-06 DIAGNOSIS — M86672 Other chronic osteomyelitis, left ankle and foot: Secondary | ICD-10-CM | POA: Diagnosis not present

## 2022-01-06 LAB — GLUCOSE, CAPILLARY
Glucose-Capillary: 251 mg/dL — ABNORMAL HIGH (ref 70–99)
Glucose-Capillary: 262 mg/dL — ABNORMAL HIGH (ref 70–99)

## 2022-01-06 NOTE — Progress Notes (Signed)
JAMIER, URBAS ([id number]) Visit Report for 01/06/2022 Arrival Information Details Patient Name: Date of Service: NA [name] 01/06/2022 10:00 A M Medical Record Number: [id number] Patient Account Number: [id number] Date of Birth/Sex: Treating RN: 01-24-1956 (66 y.o. [name] Primary Care Rishard Delange: [name] Other Clinician: [name] Referring Cristol Engdahl: Treating Adalis Gatti/Extender: [name] in Treatment: 19 Visit Information History Since Last Visit All ordered tests and consults were completed: Yes Patient Arrived: Ambulatory Added or deleted any medications: No Arrival Time: 09:20 Any new allergies or adverse reactions: No Accompanied By: self Had a fall or experienced change in No Transfer Assistance: None activities of daily living that may affect Patient Identification Verified: Yes risk of falls: Secondary Verification Process Completed: Yes Signs or symptoms of abuse/neglect since last visito No Patient Requires Transmission-Based Precautions: No Hospitalized since last visit: No Patient Has Alerts: Yes Implantable device outside of the clinic excluding No Patient Alerts: Patient on Blood Thinner cellular tissue based products placed in the center since last visit: Pain Present Now: No Electronic Signature(s) Signed: 01/06/2022 11:58:18 AM By: [name] CHT EMT BS , , Entered By: [name] on 01/06/2022 11:58:18 -------------------------------------------------------------------------------- Encounter Discharge Information Details Patient Name: Date of Service: [name] 01/06/2022 10:00 A M Medical Record Number: [id number] Patient Account Number: [id number] Date of Birth/Sex: Treating RN: May 11, 1956 (66 y.o. [name] Primary Care Malillany Kazlauskas: [name] Other Clinician: [name] Referring Lynette Noah: Treating Jacqulyn Barresi/Extender: [name] in  Treatment: 19 Encounter Discharge Information Items Discharge Condition: Stable Ambulatory Status: Ambulatory Discharge Destination: Home Transportation: Private Auto Accompanied By: self Schedule Follow-up Appointment: No Clinical Summary of Care: Electronic Signature(s) Signed: 01/06/2022 12:03:30 PM By: [name] CHT EMT BS , , Entered By: [name] on 01/06/2022 12:03:30 -------------------------------------------------------------------------------- Vitals Details Patient Name: Date of Service: NA [name] 01/06/2022 10:00 A M Medical Record Number: [id number] Patient Account Number: [id number] Date of Birth/Sex: Treating RN: [birth year] (66 y.o. [name] Primary Care Vashon Arch: [name] Other Clinician: [name] Referring Jarquavious Fentress: Treating Renea Schoonmaker/Extender: [name]: 19 Vital Signs Time Taken: 09:32 Temperature (F): 97.2 Height (in): 74 Pulse (bpm): 89 Weight (lbs): 186 Respiratory Rate (breaths/min): 14 Body Mass Index (BMI): 23.9 Blood Pressure (mmHg): 115/77 Capillary Blood Glucose (mg/dl): 341 Reference Range: 80 - 120 mg / dl Electronic Signature(s) Signed: 01/06/2022 11:58:45 AM By: [name] CHT EMT BS , , Entered By: [name] on 01/06/2022 11:58:45

## 2022-01-06 NOTE — Progress Notes (Signed)
JUNG, YURCHAK ([id number]) Visit Report for 01/06/2022 SuperBill Details Patient Name: Date of Service: NA [name] 01/06/2022 Medical Record Number: [id number] Patient Account Number: [id number] Date of Birth/Sex: Treating RN: [birth year] (66 y.o. 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 Facility Procedures CPT4 Code Description Modifier Quantity [id number] G0277-(Facility Use Only) HBOT full body chamber, , 4 ICD-10 Diagnosis Description E11.621 Type 2 diabetes mellitus with foot ulcer 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 M86.672 Other chronic osteomyelitis, left ankle and foot 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

## 2022-01-06 NOTE — Progress Notes (Addendum)
[name], [name] ([id number]) Visit Report for 01/06/2022 HBO Details Patient Name: Date of Service: NA [name] 01/06/2022 10:00 A M Medical Record Number: [id number] Patient Account Number: [id number] Date of Birth/Sex: Treating RN: 09-08-1955 (66 y.o. [name] Primary Care Zamere Pasternak: [name] Other Clinician: [name] Referring Josiane Labine: Treating Omni Dunsworth/Extender: [name]: 19 HBO Treatment Course Details Treatment Course Number: 1 Ordering Vernor Monnig: [name] T Treatments Ordered: otal 120 HBO Treatment Start Date: 09/20/2021 HBO Indication: Diabetic Ulcer(s) of the Lower Extremity Standard/Conservative Wound Care tried and failed greater than or equal to 30 days HBO Treatment Details Treatment Number: 74 Patient Type: Outpatient Chamber Type: Monoplace Chamber Serial #: [id number] Treatment Protocol: 2.0 ATA with 90 minutes oxygen, with two 5 minute air breaks Treatment Details Compression Rate Down: 2.0 psi / minute De-Compression Rate Up: 2.0 psi / minute A breaks and breathing ir Compress Tx Pressure periods Decompress Decompress Begins Reached (leave unused spaces Begins Ends blank) Chamber Pressure (ATA 1 2 2 2 2 2  --2 1 ) Clock Time (24 hr) 09:40 09:51 10:21 10:26 10:56 11:01 - - 11:31 11:42 Treatment Length: 122 (minutes) Treatment Segments: 4 Vital Signs Capillary Blood Glucose Reference Range: 80 - 120 mg / dl HBO Diabetic Blood Glucose Intervention Range: <131 mg/dl or mg/dl Type: Time Vitals Blood Pulse: Respiratory Capillary Blood Glucose Pulse Action Temperature: Taken: Pressure: Rate: Glucose (mg/dl): Meter #: Oximetry (%) Taken: Pre 09:32 115/77 89 14 97.2 251 1 Glucose > 249 mg/dL Post >833 82:50 73 16 97.9 262 1 Glucose > 249 mg/dL Treatment Response Treatment Toleration: Well Treatment Completion Status: Treatment Completed without Adverse Event Additional  Procedure Documentation Tissue Sevierity: Necrosis of bone Physician HBO Attestation: I certify that I supervised this HBO treatment in accordance with Medicare guidelines. A trained emergency response team is readily available per Yes hospital policies and procedures. Continue HBOT as ordered. Yes Electronic Signature(s) Signed: 01/06/2022 12:48:23 PM By: 01/08/2022 MD FACS Previous Signature: 01/06/2022 12:02:32 PM Version By: 01/08/2022 CHT EMT BS , , Entered By: [name] on 01/06/2022 12:48:22 -------------------------------------------------------------------------------- HBO Safety Checklist Details Patient Name: Date of Service: 01/08/2022 A. 01/06/2022 10:00 A M Medical Record Number: 01/08/2022 Patient Account Number: [id number] Date of Birth/Sex: Treating RN: 12-30-1955 (66 y.o. 77 Primary Care [name]: [name] Other Clinician: [name] Referring Baby Gieger: Treating [name]/Extender: [name] Weeks in Treatment: 19 HBO Safety Checklist Items Safety Checklist Consent Form Signed Patient voided / foley secured and emptied When did you last eato 0700 Last dose of injectable or oral agent 0700 Ostomy pouch emptied and vented if applicable NA All implantable devices assessed, documented and approved NA Intravenous access site secured and place NA Valuables secured Linens and cotton and cotton/polyester blend (less than 51% polyester) Personal oil-based products / skin lotions / body lotions removed Wigs or hairpieces removed NA Smoking or tobacco materials removed NA Books / newspapers / magazines / loose paper removed Cologne, aftershave, perfume and deodorant removed Jewelry removed (may wrap wedding band) Make-up removed Hair care products removed Battery operated devices (external) removed Heating patches and chemical warmers removed Titanium eyewear removed Nail polish cured  greater than 10 hours NA Casting material cured greater than 10 hours NA Hearing aids removed NA Loose dentures or partials removed NA Prosthetics have been removed NA Patient demonstrates correct use of air break device (if applicable) Patient concerns have been addressed Patient  grounding bracelet on and cord attached to chamber Specifics for Inpatients (complete in addition to above) Medication sheet sent with patient NA Intravenous medications needed or due during therapy sent with patient NA Drainage tubes (e.g. nasogastric tube or chest tube secured and vented) NA Endotracheal or Tracheotomy tube secured NA Cuff deflated of air and inflated with saline NA Airway suctioned NA Notes Paper version used prior to treatment. Electronic Signature(s) Signed: 01/06/2022 11:59:48 AM By: [name] CHT EMT BS , , Entered By: [name] on 01/06/2022 11:59:48

## 2022-01-07 ENCOUNTER — Encounter (HOSPITAL_BASED_OUTPATIENT_CLINIC_OR_DEPARTMENT_OTHER): Payer: BC Managed Care – PPO | Admitting: General Surgery

## 2022-01-07 DIAGNOSIS — I252 Old myocardial infarction: Secondary | ICD-10-CM | POA: Diagnosis not present

## 2022-01-07 DIAGNOSIS — E11621 Type 2 diabetes mellitus with foot ulcer: Secondary | ICD-10-CM | POA: Diagnosis not present

## 2022-01-07 DIAGNOSIS — T8131XA Disruption of external operation (surgical) wound, not elsewhere classified, initial encounter: Secondary | ICD-10-CM | POA: Diagnosis not present

## 2022-01-07 DIAGNOSIS — E1140 Type 2 diabetes mellitus with diabetic neuropathy, unspecified: Secondary | ICD-10-CM | POA: Diagnosis not present

## 2022-01-07 DIAGNOSIS — Z9181 History of falling: Secondary | ICD-10-CM | POA: Diagnosis not present

## 2022-01-07 DIAGNOSIS — Z7982 Long term (current) use of aspirin: Secondary | ICD-10-CM | POA: Diagnosis not present

## 2022-01-07 DIAGNOSIS — E785 Hyperlipidemia, unspecified: Secondary | ICD-10-CM | POA: Diagnosis not present

## 2022-01-07 DIAGNOSIS — Z7984 Long term (current) use of oral hypoglycemic drugs: Secondary | ICD-10-CM | POA: Diagnosis not present

## 2022-01-07 DIAGNOSIS — Z794 Long term (current) use of insulin: Secondary | ICD-10-CM | POA: Diagnosis not present

## 2022-01-07 DIAGNOSIS — Z951 Presence of aortocoronary bypass graft: Secondary | ICD-10-CM | POA: Diagnosis not present

## 2022-01-07 DIAGNOSIS — M1990 Unspecified osteoarthritis, unspecified site: Secondary | ICD-10-CM | POA: Diagnosis not present

## 2022-01-07 DIAGNOSIS — L97324 Non-pressure chronic ulcer of left ankle with necrosis of bone: Secondary | ICD-10-CM | POA: Diagnosis not present

## 2022-01-07 DIAGNOSIS — M86672 Other chronic osteomyelitis, left ankle and foot: Secondary | ICD-10-CM | POA: Diagnosis not present

## 2022-01-07 DIAGNOSIS — E11622 Type 2 diabetes mellitus with other skin ulcer: Secondary | ICD-10-CM | POA: Diagnosis not present

## 2022-01-07 DIAGNOSIS — I2510 Atherosclerotic heart disease of native coronary artery without angina pectoris: Secondary | ICD-10-CM | POA: Diagnosis not present

## 2022-01-07 DIAGNOSIS — E11610 Type 2 diabetes mellitus with diabetic neuropathic arthropathy: Secondary | ICD-10-CM | POA: Diagnosis not present

## 2022-01-07 DIAGNOSIS — L97322 Non-pressure chronic ulcer of left ankle with fat layer exposed: Secondary | ICD-10-CM | POA: Diagnosis not present

## 2022-01-07 DIAGNOSIS — K219 Gastro-esophageal reflux disease without esophagitis: Secondary | ICD-10-CM | POA: Diagnosis not present

## 2022-01-07 DIAGNOSIS — I10 Essential (primary) hypertension: Secondary | ICD-10-CM | POA: Diagnosis not present

## 2022-01-07 DIAGNOSIS — D649 Anemia, unspecified: Secondary | ICD-10-CM | POA: Diagnosis not present

## 2022-01-07 LAB — GLUCOSE, CAPILLARY
Glucose-Capillary: 271 mg/dL — ABNORMAL HIGH (ref 70–99)
Glucose-Capillary: 229 mg/dL — ABNORMAL HIGH (ref 70–99)

## 2022-01-07 NOTE — Progress Notes (Signed)
[name], [name] ([id number]) Visit Report for 01/07/2022 Chief Complaint Document Details Patient Name: Date of Service: NA [name] 01/07/2022 12:45 PM Medical Record Number: [id number] Patient Account Number: [id number] Date of Birth/Sex: Treating RN: [birth year] (66 y.o. [name] Primary Care Provider: [name] Other Clinician: Referring Provider: Treating Provider/Extender: [name] in Treatment: 19 Information Obtained from: Patient Chief Complaint Patients presents for treatment of an open diabetic ulcer with exposed bone and osteomyelitis Electronic Signature(s) Signed: 01/07/2022 1:05:59 PM By: [name] MD FACS Entered By: [name] on 01/07/2022 13:05:59 -------------------------------------------------------------------------------- Debridement Details Patient Name: Date of Service: [name] 01/07/2022 12:45 PM Medical Record Number: [id number] Patient Account Number: [id number] Date of Birth/Sex: Treating RN: 14-Feb-1956 (66 y.o. [name] Primary Care Provider: [name] Other Clinician: Referring Provider: Treating Provider/Extender: [name] in Treatment: 19 Debridement Performed for Assessment: Wound #1 Left,Medial Foot Performed By: Physician [name], MD Debridement Type: Debridement Severity of Tissue Pre Debridement: Fat layer exposed Level of Consciousness (Pre-procedure): Awake and Alert Pre-procedure Verification/Time Out Yes - 12:58 Taken: Start Time: 12:58 T Area Debrided (L x W): otal 2 (cm) x 0.5 (cm) = 1 (cm) Tissue and other material debrided: Non-Viable, Slough, Slough Level: Non-Viable Tissue Debridement Description: Selective/Open Wound Instrument: Curette Bleeding: Minimum Hemostasis Achieved: Pressure Procedural Pain: 0 Post Procedural Pain: 0 Response to Treatment: Procedure was tolerated well Level of Consciousness  (Post- Awake and Alert procedure): Post Debridement Measurements of Total Wound Length: (cm) 6.3 Width: (cm) 2.7 Depth: (cm) 0.2 Volume: (cm) 2.672 Character of Wound/Ulcer Post Debridement: Improved Severity of Tissue Post Debridement: Fat layer exposed Post Procedure Diagnosis Same as Pre-procedure Electronic Signature(s) Signed: 01/07/2022 4:01:31 PM By: [name] MD FACS Signed: 01/07/2022 4:47:37 PM By: Adline Peals Entered By: Adline Peals on 01/07/2022 12:59:56 -------------------------------------------------------------------------------- HPI Details Patient Name: Date of Service: NA Colin Colin Sportsman A. 01/07/2022 12:45 PM Medical Record Number: [id number] Patient Account Number: [id number] Date of Birth/Sex: Treating RN: 06-30-1955 (66 y.o. [name] Primary Care Provider: [name] Other Clinician: Referring Provider: Treating Provider/Extender: [name] in Treatment: 19 History of Present Illness HPI Description: ADMISSION 08/25/2021 This is a 66 year old man who initially presented to his primary care provider in September 2022 with pain in his left foot. He was sent for an x-ray and while the x-ray was being performed, the tech pointed out a wound on his foot that the patient was not aware existed. He does have type 2 diabetes with significant neuropathy. His diabetes is suboptimally controlled with his most recent A1c being 8.5. He also has a history of coronary artery disease status post three- vessel CABG. he was initially seen by orthopedics, but they referred him to [facility]. He has undergone at least 7 operations/debridements and several applications of skin substitute under the care of podiatry. He has been in a wound VAC for much of this time. His most recent procedure was July 28, 2021. A portion of the talus was biopsied and was found to be consistent with osteomyelitis. Culture also  returned positive for corynebacterium. He was seen on August 16, 2021 by infectious disease. A PICC line has been placed and he will be receiving a 6-week course of IV daptomycin and cefepime. In October 2022, he underwent lower extremity vascular studies. Results are copied here: Right: Resting right ankle-brachial index is within normal range. No evidence  of significant right lower extremity arterial disease. The right toe-brachial index is abnormal. Left: Resting left ankle-brachial index indicates mild left lower extremity arterial disease. The left toe-brachial index is abnormal. He has not been seen by vascular surgery despite these findings. He presented to clinic today in a cam boot and is using a knee scooter to offload. Wound VAC was in place. Once this was removed, a large ulcer was identified on the left midfoot/ankle. Bone is frankly exposed. There is no malodorous or purulent drainage. There is some granulation tissue over the central portion of the exposed bone. There is a tunnel that extends posteriorly for roughly 10 cm. It has been discussed with him by multiple providers that he is at very high risk of losing his lower leg because of this wound. He is extremely eager to avoid this outcome and is here today to review his options as well as receive ongoing wound care. 09/03/2021: Here for reevaluation of his wound. There does not appear to have been any substantial improvement overall since our last visit. He has been in a wound VAC with white foam overlying the exposed bone. We are working on getting him approved for hyperbaric oxygen therapy. 09/10/2021: We are in the process of getting him cleared to begin hyperbaric oxygen therapy. He still needs to obtain a chest x-ray. Although the wound measurements are roughly the same, I think the overall appearance of the wound is better. The exposed bone has a bit more granulation tissue covering it. He has not received a vascular surgery  appointment to reevaluate his flow to the wound. 09/17/2021: He has been approved for hyperbaric oxygen therapy and completed his chest x-ray, which I reviewed and it appears normal. The tunnels at the 12 and 10:00 positions are smaller. There is more granulation tissue covering the exposed bone and the undermining has decreased. He still has not received a vascular surgery appointment. 09/24/2021: He initiated hyperbaric oxygen therapy this week and is tolerating it well. He has an appointment with vascular surgery coming up on May 16. The granulation tissue is covering more of the exposed bone and both tunnels are a bit smaller. 10/01/2021: He continues to tolerate hyperbaric oxygen therapy. He saw infectious disease and they are planning to pull his PICC line. He has been initiated on oral antibiotics (doxycycline and Augmentin). The wound looks about the same but the tunnels are a little bit smaller. The skin seems to be contracting somewhat around the exposed bone. 10/08/2021: The wound is still about the same size, but the tunnels continue to come in and the skin is contracting around the exposed bone. He continues to have some accumulation of necrotic material in the inferoposterior aspect of the wound as well as accumulation at the 12:00 tunnel area. 10/15/2021: The wound is smaller today. The tunnels continue to come in. There is less necrotic tissue present. He does have some periwound maceration. 10/22/2021: The wound is about the same size. There is a little bit less undermining at the distal portion. The exposed bone is dark and I am not sure if this is staining from silver nitrate or his VAC sponge or if it represents necrosis. The tunnels are shallower but he does have some serous drainage coming from the 10:00 tunnel. He continues to tolerate hyperbaric oxygen therapy well. 10/29/2021: The undermining continues to improve. The tunnels are about the same. He has good granulation tissue overlying  the majority of the exposed bone. It does appear that perhaps  the tubing from his wound VAC has been eroding the skin at the 12 clock position. He continues to accumulate senescent epithelium around the borders of the wound. 11/05/2021: The undermining is almost completely resolved. The tunnels have contracted fairly significantly. No significant slough or debris accumulation. There is still senescent epithelium accumulation around the borders of the wound. He has been tolerating hyperbaric oxygen therapy well. 11/12/2021: Despite the measurements of the wound being about the same, the wound has changed in its shape and overall, I think it is improved. The undermining has resolved and the tunnels continue to shorten. There is good granulation tissue encroaching over the small area of bone that has remained exposed at the 12 o'clock position. Minimal slough accumulation. He continues to tolerate hyperbaric oxygen therapy well. 11/19/2021: I took a PCR culture last week. There was overgrowth of yeast. He is already taking suppressive doxycycline and Augmentin. I added fluconazole to his regimen. The wound is smaller again today. The tunnels continue to shorten. He continues to do well with hyperbaric oxygen therapy. 11/26/2021: For some reason, his foot has become macerated. The wound is narrower but about the same dimensions in its longitudinal aspect. The tunnels continue to shorten. He has some slough buildup on the wound as well as some heaped up senescent epithelium around the perimeter. 12/03/2021: No further maceration of his foot has occurred. The wound has contracted quite significantly from last week. The tunnel at 10:00 is closed. The tunnel at 12:00 is down to just a couple of millimeters. No other undermining is present. There is soft tissue coverage of the previously exposed bone. There is just a bit of slough and biofilm on the wound surface. 12/10/2021: The wound is looking good. It turns out  the tunnel at 12:00 is only exposed when the patient dorsiflexes his foot. It is about 2 cm in depth when he does this; when his foot is in plantarflexion, the tunnel is closed. The bone that was visible at the 12:00 tunnel is completely covered with granulation tissue, but there does feel like some exposed bone deeper into the tunnel area. There is senescent skin heaped up around the periphery. Minimal slough on the wound surface. 12/16/2021: The wound dimensions are roughly the same. The surface has nice granulation tissue. The exposed bone at the 12:00 tunnel continues to be covered with more soft tissue. 7/14; patient's wound measures smaller today. Using the wound VAC with underlying collagen. He is also being treated with HBO for underlying osteomyelitis. He tells me he is on doxycycline and ampicillin follows with infectious disease next week 12/31/2021: The wound continues to contract. Unfortunately, the area where the track pad and tubing have been rubbing continues to look like it is applying friction. He says that the home health nurses that have been applying the [facility] have been putting gauze underneath the tubing, but nonetheless there is ongoing tissue breakdown at this site. Light slough accumulation on the wound surface. The tunnel continues to contract. He is tolerating HBO without difficulty. 01/07/2022: Bridging the wound VAC away from the ankle has resulted in significant improvement in the tissue at the apex of the wound. The tunnel is still present and is not all that much shorter, but the overall wound surface is very robust and healthy looking. Minimal slough accumulation. No concern for acute infection. Electronic Signature(s) Signed: 01/07/2022 1:06:56 PM By: [name] MD FACS Entered By: [name] on 01/07/2022 13:06:55 -------------------------------------------------------------------------------- Physical Exam Details Patient Name: Date of Service:  NA [name],  [name] A. 01/07/2022 12:45 PM Medical Record Number: [id number] Patient Account Number: [id number] Date of Birth/Sex: Treating RN: [birth year] (65 y.o. [name] Primary Care Provider: [name] Other Clinician: Referring Provider: Treating Provider/Extender: [name] Weeks in Treatment: 19 Constitutional Hypertensive, asymptomatic. . . . No acute distress.Marland Kitchen Respiratory Normal work of breathing on room air.. Notes 01/07/2022: Bridging the wound VAC away from the ankle has resulted in significant improvement in the tissue at the apex of the wound. The tunnel is still present and is not all that much shorter, but the overall wound surface is very robust and healthy looking. Minimal slough accumulation. No concern for acute infection. Electronic Signature(s) Signed: 01/07/2022 1:07:41 PM By: [name] MD FACS Entered By: [name] on 01/07/2022 13:07:41 -------------------------------------------------------------------------------- Physician Orders Details Patient Name: Date of Service: [name] 01/07/2022 12:45 PM Medical Record Number: [id number] Patient Account Number: [id number] Date of Birth/Sex: Treating RN: 05/04/1956 (66 y.o. [name] Primary Care Provider: [name] Other Clinician: Referring Provider: Treating Provider/Extender: [name] in Treatment: 19 Verbal / Phone Orders: No Diagnosis Coding Follow-up Appointments ppointment in 1 week. - Dr. Celine Ahr - Room 2 - 8/11 at 12:45 PM Return A Bathing/ Shower/ Hygiene May shower with protection but do not get wound dressing(s) wet. Negative Presssure Wound Therapy Wound Vac to wound continuously at 169mm/hg pressure Black Foam White Foam - Apply white foam or saline moistened gauze to tunnel at 10:00 and 12:00 Other: - Apply silver collagen on wound bed, under black foam Home Health New wound care orders this week; continue  Home Health for wound care. May utilize formulary equivalent dressing for wound treatment orders unless otherwise specified. - recommend bridging the wound vac to avoid pressure on the wound Dressing changes to be completed by [facility] on Monday / Wednesday / Friday except when patient has scheduled visit at [facility]. Other Home Health Orders/Instructions: [facility]: Change vac 3x week Mon. Wed. and Fri. after wound care visit. Hyperbaric Oxygen Therapy Evaluate for HBO Therapy Indication: - Wagner 3 diabetic ulcer left foot If appropriate for treatment, begin HBOT per protocol: 2.5 ATA for 90 Minutes with 2 Five (5) Minute A Breaks ir Total Number of Treatments: - 12/31/2021 additional 40 treatments. T of 120 otal One treatments per day (delivered Monday through Friday unless otherwise specified in Special Instructions below): Finger stick Blood Glucose Pre- and Post- HBOT Treatment. Follow Hyperbaric Oxygen Glycemia Protocol A frin (Oxymetazoline HCL) 0.05% nasal spray - 1 spray in both nostrils daily as needed prior to HBO treatment for difficulty clearing ears Wound Treatment Wound #1 - Foot Wound Laterality: Left, Medial Cleanser: Wound Cleanser 3 x Per Week/30 Days Discharge Instructions: Cleanse the wound with wound cleanser prior to applying a clean dressing using gauze sponges, not tissue or cotton balls. Peri-Wound Care: Zinc Oxide Ointment 30g tube 3 x Per Week/30 Days Discharge Instructions: Apply Zinc Oxide to periwound with each dressing change Prim Dressing: Promogran Prisma Matrix, 4.34 (sq in) (silver collagen) 3 x Per Week/30 Days ary Discharge Instructions: Moisten collagen with saline or hydrogel Secondary Dressing: ABD Pad, 5x9 3 x Per Week/30 Days Discharge Instructions: In clinic. Home Health to apply wound vac. Secondary Dressing: Woven Gauze Sponge, Non-Sterile 4x4 in 3 x Per Week/30 Days Discharge Instructions: In clinic. Home Health to apply wound  vac. Secured With: [employer], 4.5x3.1 (in/yd) 3 x Per  Week/30 Days Discharge Instructions: In clinic. Home Health to apply wound vac. Secured With: 72M Medipore H Soft Cloth Surgical T ape, 4 x 10 (in/yd) 3 x Per Week/30 Days Discharge Instructions: In clinic. Home Health to apply wound vac. GLYCEMIA INTERVENTIONS PROTOCOL PRE-HBO GLYCEMIA INTERVENTIONS ACTION INTERVENTION Obtain pre-HBO capillary blood glucose (ensure 1 physician order is in chart). A. Notify HBO physician and await physician orders. orders. 2 If result is 70 mg/dl or below: B. If the result meets the hospital definition of a critical result, follow hospital policy. A. Give patient an 8 ounce Glucerna Shake, an 8 ounce Ensure, or 8 ounces of a Glucerna/Ensure equivalent dietary supplement*. B. Wait 30 minutes. If result is 71 mg/dl to 130 mg/dl: C. Retest patients capillary blood glucose (CBG). D. If result greater than or equal to 110 mg/dl, proceed with HBO. If result less than 110 mg/dl, notify HBO physician and consider holding HBO. If result is 131 mg/dl to 249 mg/dl: A. Proceed with HBO. A. Notify HBO physician and await physician orders. B. It is recommended to hold HBO and do If result is 250 mg/dl or greater: blood/urine ketone testing. C. If the result meets the hospital definition of a critical result, follow hospital policy. POST-HBO GLYCEMIA INTERVENTIONS ACTION INTERVENTION Obtain post HBO capillary blood glucose (ensure 1 physician order is in chart). A. Notify HBO physician and await physician orders. 2 If result is 70 mg/dl or below: B. If the result meets the hospital definition of a critical result, follow hospital policy. A. Give patient an 8 ounce Glucerna Shake, an 8 ounce Ensure, or 8 ounces of a Glucerna/Ensure equivalent dietary supplement*. B. Wait 15 minutes for symptoms of If result is 71 mg/dl to 100 mg/dl: hypoglycemia (i.e. nervousness,  anxiety, sweating, chills, clamminess, irritability, confusion, tachycardia or dizziness). C. If patient asymptomatic, discharge patient. If patient symptomatic, repeat capillary blood glucose (CBG) and notify HBO physician. If result is 101 mg/dl to 249 mg/dl: A. Discharge patient. A. Notify HBO physician and await physician orders. B. It is recommended to do blood/urine ketone If result is 250 mg/dl or greater: testing. C. If the result meets the hospital definition of a critical result, follow hospital policy. *Juice or candies are NOT equivalent products. If patient refuses the Glucerna or Ensure, please consult the hospital dietitian for an appropriate substitute. Electronic Signature(s) Signed: 01/07/2022 4:01:31 PM By: [name] MD FACS Signed: 01/07/2022 4:47:37 PM By: Adline Peals Previous Signature: 01/07/2022 1:07:49 PM Version By: [name] MD FACS Entered By: Adline Peals on 01/07/2022 13:11:44 -------------------------------------------------------------------------------- Problem List Details Patient Name: Date of Service: [name] 01/07/2022 12:45 PM Medical Record Number: [id number] Patient Account Number: [id number] Date of Birth/Sex: Treating RN: Jan 23, 1956 (66 y.o. 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 01/07/2022 12:45 PM Medical Record Number: [id number] Patient Account Number: [id number] Date of Birth/Sex: Treating RN: [birth year] (66 y.o. [name] Primary Care Provider: [name] Other Clinician: Referring Provider: Treating Provider/Extender: [name] in Treatment: 19 Subjective Chief Complaint Information obtained from Patient Patients presents for treatment of an open diabetic ulcer with exposed bone and osteomyelitis History of Present Illness (HPI) ADMISSION 08/25/2021 This is a 66 year old man who initially presented to his primary care provider in September 2022 with pain in his left foot. He was sent for an x-ray and while the x-ray was being performed, the tech pointed out a wound on his foot that the patient was not aware existed. He does have type 2 diabetes with significant neuropathy. His diabetes is suboptimally controlled with his most recent A1c being 8.5. He also has a history of coronary artery disease status post three- vessel CABG. he was initially seen by orthopedics, but they referred him to [facility]. He has undergone at least 7 operations/debridements and several applications of skin substitute under the care of podiatry. He has been in a wound VAC for much of this time. His most recent procedure was July 28, 2021. A portion of the talus was biopsied and was found to be consistent with osteomyelitis. Culture also returned positive for corynebacterium. He was seen on August 16, 2021 by infectious disease. A PICC line has been placed and he will be receiving a 6-week course of  IV daptomycin and cefepime. In October 2022, he underwent lower extremity vascular studies. Results are copied here: Right: Resting right ankle-brachial index is within normal range. No evidence of significant right lower extremity arterial disease. The right toe-brachial index is abnormal. Left: Resting left ankle-brachial index indicates mild left lower extremity arterial disease. The left toe-brachial index is abnormal. He has not been seen by vascular surgery despite these findings. He presented to clinic today in a cam boot and is using a knee scooter to offload. Wound VAC was in place. Once this was removed, a large ulcer was identified on the left midfoot/ankle. Bone is frankly exposed. There is no malodorous or purulent drainage. There is some granulation tissue over the central portion of the exposed bone. There is a tunnel that extends posteriorly for roughly 10 cm. It has been discussed with him by multiple providers that he is at very high risk of losing his lower leg because of this wound. He is extremely eager to avoid this outcome and is here today to review his options as well as receive ongoing wound care. 09/03/2021: Here for reevaluation of his wound. There does not appear to have been any substantial improvement overall since our last visit. He has been in a wound VAC with white foam overlying the exposed bone. We are working on getting him approved for hyperbaric oxygen therapy. 09/10/2021: We are in the process of getting him cleared to begin hyperbaric oxygen therapy. He still needs to obtain a chest x-ray. Although the wound measurements are roughly the same, I think the overall appearance of the wound is better. The exposed bone has a bit more granulation tissue covering it. He has not received a vascular surgery appointment to reevaluate his flow to the wound. 09/17/2021: He has been approved for hyperbaric oxygen therapy and completed his chest x-ray, which I reviewed and it  appears normal. The tunnels at the 12 and 10:00 positions are smaller. There is more granulation tissue covering  the exposed bone and the undermining has decreased. He still has not received a vascular surgery appointment. 09/24/2021: He initiated hyperbaric oxygen therapy this week and is tolerating it well. He has an appointment with vascular surgery coming up on May 16. The granulation tissue is covering more of the exposed bone and both tunnels are a bit smaller. 10/01/2021: He continues to tolerate hyperbaric oxygen therapy. He saw infectious disease and they are planning to pull his PICC line. He has been initiated on oral antibiotics (doxycycline and Augmentin). The wound looks about the same but the tunnels are a little bit smaller. The skin seems to be contracting somewhat around the exposed bone. 10/08/2021: The wound is still about the same size, but the tunnels continue to come in and the skin is contracting around the exposed bone. He continues to have some accumulation of necrotic material in the inferoposterior aspect of the wound as well as accumulation at the 12:00 tunnel area. 10/15/2021: The wound is smaller today. The tunnels continue to come in. There is less necrotic tissue present. He does have some periwound maceration. 10/22/2021: The wound is about the same size. There is a little bit less undermining at the distal portion. The exposed bone is dark and I am not sure if this is staining from silver nitrate or his VAC sponge or if it represents necrosis. The tunnels are shallower but he does have some serous drainage coming from the 10:00 tunnel. He continues to tolerate hyperbaric oxygen therapy well. 10/29/2021: The undermining continues to improve. The tunnels are about the same. He has good granulation tissue overlying the majority of the exposed bone. It does appear that perhaps the tubing from his wound VAC has been eroding the skin at the 12 clock position. He continues to  accumulate senescent epithelium around the borders of the wound. 11/05/2021: The undermining is almost completely resolved. The tunnels have contracted fairly significantly. No significant slough or debris accumulation. There is still senescent epithelium accumulation around the borders of the wound. He has been tolerating hyperbaric oxygen therapy well. 11/12/2021: Despite the measurements of the wound being about the same, the wound has changed in its shape and overall, I think it is improved. The undermining has resolved and the tunnels continue to shorten. There is good granulation tissue encroaching over the small area of bone that has remained exposed at the 12 o'clock position. Minimal slough accumulation. He continues to tolerate hyperbaric oxygen therapy well. 11/19/2021: I took a PCR culture last week. There was overgrowth of yeast. He is already taking suppressive doxycycline and Augmentin. I added fluconazole to his regimen. The wound is smaller again today. The tunnels continue to shorten. He continues to do well with hyperbaric oxygen therapy. 11/26/2021: For some reason, his foot has become macerated. The wound is narrower but about the same dimensions in its longitudinal aspect. The tunnels continue to shorten. He has some slough buildup on the wound as well as some heaped up senescent epithelium around the perimeter. 12/03/2021: No further maceration of his foot has occurred. The wound has contracted quite significantly from last week. The tunnel at 10:00 is closed. The tunnel at 12:00 is down to just a couple of millimeters. No other undermining is present. There is soft tissue coverage of the previously exposed bone. There is just a bit of slough and biofilm on the wound surface. 12/10/2021: The wound is looking good. It turns out the tunnel at 12:00 is only exposed when the patient dorsiflexes his  foot. It is about 2 cm in depth when he does this; when his foot is in plantarflexion, the  tunnel is closed. The bone that was visible at the 12:00 tunnel is completely covered with granulation tissue, but there does feel like some exposed bone deeper into the tunnel area. There is senescent skin heaped up around the periphery. Minimal slough on the wound surface. 12/16/2021: The wound dimensions are roughly the same. The surface has nice granulation tissue. The exposed bone at the 12:00 tunnel continues to be covered with more soft tissue. 7/14; patient's wound measures smaller today. Using the wound VAC with underlying collagen. He is also being treated with HBO for underlying osteomyelitis. He tells me he is on doxycycline and ampicillin follows with infectious disease next week 12/31/2021: The wound continues to contract. Unfortunately, the area where the track pad and tubing have been rubbing continues to look like it is applying friction. He says that the home health nurses that have been applying the [facility] have been putting gauze underneath the tubing, but nonetheless there is ongoing tissue breakdown at this site. Light slough accumulation on the wound surface. The tunnel continues to contract. He is tolerating HBO without difficulty. 01/07/2022: Bridging the wound VAC away from the ankle has resulted in significant improvement in the tissue at the apex of the wound. The tunnel is still present and is not all that much shorter, but the overall wound surface is very robust and healthy looking. Minimal slough accumulation. No concern for acute infection. Patient History Information obtained from Patient. Family History Cancer - Father, Diabetes - Father,Mother,Paternal Grandparents, Heart Disease - Father, Hypertension - Father, No family history of Hereditary Spherocytosis, Kidney Disease, Lung Disease, Seizures, Stroke, Thyroid Problems, Tuberculosis. Social History Never smoker, Marital Status - Married, Alcohol Use - Rarely, Drug Use - No History, Caffeine Use - Daily. Medical  History Eyes Patient has history of Cataracts - Removed 2008 Cardiovascular Patient has history of Coronary Artery Disease, Hypertension, Myocardial Infarction, Peripheral Arterial Disease Endocrine Patient has history of Type II Diabetes Musculoskeletal Patient has history of Osteomyelitis Neurologic Patient has history of Neuropathy Medical A Surgical History Notes nd Cardiovascular Hypercholesterolemia Abnormal EKG CABG X3 2019 Gastrointestinal GERD Musculoskeletal Diabetic foot ulcer Objective Constitutional Hypertensive, asymptomatic. No acute distress.. Vitals Time Taken: 12:15 PM, Height: 74 in, Weight: 186 lbs, BMI: 23.9, Temperature: 97.1 F, Pulse: 68 bpm, Respiratory Rate: 16 breaths/min, Blood Pressure: 155/83 mmHg, Capillary Blood Glucose: 229 mg/dl. Respiratory Normal work of breathing on room air.. General Notes: 01/07/2022: Bridging the wound VAC away from the ankle has resulted in significant improvement in the tissue at the apex of the wound. The tunnel is still present and is not all that much shorter, but the overall wound surface is very robust and healthy looking. Minimal slough accumulation. No concern for acute infection. Integumentary (Hair, Skin) Wound #1 status is Open. Original cause of wound was Gradually Appeared. The date acquired was: 02/22/2021. The wound has been in treatment 19 weeks. The wound is located on the Left,Medial Foot. The wound measures 6.3cm length x 2.7cm width x 0.2cm depth; 13.36cm^2 area and 2.672cm^3 volume. There is Fat Layer (Subcutaneous Tissue) exposed. There is no undermining noted, however, there is tunneling at 12:00 with a maximum distance of 2cm. There is a medium amount of serosanguineous drainage noted. The wound margin is distinct with the outline attached to the wound base. There is large (67-100%) red, hyper - granulation within the wound bed. There is a  small (1-33%) amount of necrotic tissue within the wound bed  including Adherent Slough. 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 There was a Selective/Open Wound Non-Viable Tissue Debridement with a total area of 1 sq cm performed by [name], MD. With the following instrument(s): Curette to remove Non-Viable tissue/material. Material removed includes [facility]. No specimens were taken. A time out was conducted at 12:58, prior to the start of the procedure. A Minimum amount of bleeding was controlled with Pressure. The procedure was tolerated well with a pain level of 0 throughout and a pain level of 0 following the procedure. Post Debridement Measurements: 6.3cm length x 2.7cm width x 0.2cm depth; 2.672cm^3 volume. Character of Wound/Ulcer Post Debridement is improved. Severity of Tissue Post Debridement is: Fat layer exposed. Post procedure Diagnosis Wound #1: Same as Pre-Procedure Plan 01/07/2022: Bridging the wound VAC away from the ankle has resulted in significant improvement in the tissue at the apex of the wound. The tunnel is still present and is not all that much shorter, but the overall wound surface is very robust and healthy looking. Minimal slough accumulation. No concern for acute infection. I used a curette to debride what little slough was on the wound surface. We will continue using the wound VAC over Prisma silver collagen. If it were not for the presence of the tunnel, I think a skin substitute would be a good choice for him, but until the tunneling is  closed, I think we will need to stick with the VAC. He will continue hyperbaric oxygen therapy and follow-up with me in 2 weeks. Electronic Signature(s) Signed: 01/07/2022 1:08:45 PM By: [name] MD FACS Entered By: [name] on 01/07/2022 13:08:45 -------------------------------------------------------------------------------- HxROS Details Patient Name: Date of Service: NA Colin Colin Halsted A. 01/07/2022 12:45 PM Medical Record Number: [id number] Patient Account Number: [id number] Date of Birth/Sex: Treating RN: 13-Nov-1955 (65 y.o. [name] Primary Care Provider: [name] Other Clinician: Referring Provider: Treating Provider/Extender: [name]: 19 Information Obtained From Patient Eyes Medical History: Positive for: Cataracts - Removed 2008 Cardiovascular Medical History: Positive for: Coronary Artery Disease; Hypertension; Myocardial Infarction; Peripheral Arterial Disease Past Medical History Notes: Hypercholesterolemia Abnormal EKG CABG X3 2019 Gastrointestinal Medical History: Past Medical History Notes: GERD Endocrine Medical History: Positive for: Type II Diabetes Time with diabetes: 24 years Treated with: Insulin, Oral agents Blood sugar tested every day: Yes Tested : Musculoskeletal Medical History: Positive for: Osteomyelitis Past Medical History Notes: Diabetic foot ulcer Neurologic Medical History: Positive for: Neuropathy HBO Extended History Items Eyes: Cataracts Immunizations Pneumococcal Vaccine: Received Pneumococcal Vaccination: Yes Received Pneumococcal Vaccination On or After 60th Birthday: Yes Implantable Devices Yes Family and Social History Cancer: Yes - Father; Diabetes: Yes - Father,Mother,Paternal Grandparents; Heart Disease: Yes - Father; Hereditary Spherocytosis: No; Hypertension: Yes - Father; Kidney Disease: No; Lung Disease: No; Seizures: No; Stroke: No; Thyroid  Problems: No; Tuberculosis: No; Never smoker; Marital Status - Married; Alcohol Use: Rarely; Drug Use: No History; Caffeine Use: Daily; Financial Concerns: No; Food, Clothing or Shelter Needs: No; Support System Lacking: No; Transportation  Concerns: No Electronic Signature(s) Signed: 01/07/2022 4:01:31 PM By: [name] MD FACS Signed: 01/07/2022 4:47:37 PM By: Adline Peals Entered By: [name] on 01/07/2022 13:07:02 -------------------------------------------------------------------------------- SuperBill Details Patient Name: Date of Service: [name] 01/07/2022 Medical Record Number: [id number] Patient Account Number: [id number] Date of Birth/Sex: Treating RN: May 21, 1956 (66 y.o. 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 Facility Procedures CPT4 Code: [id number] Description: [phone] - DEBRIDE WOUND 1ST 20 SQ CM OR < ICD-10 Diagnosis Description L97.324 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 Modifier: Quantity: 1 Physician Procedures : CPT4 Code Description Modifier [id number] - WC PHYS LEVEL 4 - EST PT 25 ICD-10 Diagnosis Description L97.324 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

## 2022-01-07 NOTE — Progress Notes (Signed)
[name] ([id number]) Visit Report for 01/07/2022 Arrival Information Details Patient Name: Date of Service: NA [name] 01/07/2022 12:45 PM Medical Record Number: [id number] Patient Account Number: [id number] Date of Birth/Sex: Treating RN: 1956/01/22 (66 y.o. [name] Primary Care Delton Stelle: [name] Other Clinician: Referring Joyel Chenette: Treating Jordanne Elsbury/Extender: [name] in Treatment: 19 Visit Information History Since Last Visit Added or deleted any medications: No Patient Arrived: Knee Scooter Any new allergies or adverse reactions: No Arrival Time: 12:43 Had a fall or experienced change in No Accompanied By: self activities of daily living that may affect Transfer Assistance: None risk of falls: Patient Identification Verified: Yes Signs or symptoms of abuse/neglect since last visito No Secondary Verification Process Completed: Yes Hospitalized since last visit: No Patient Requires Transmission-Based Precautions: No Implantable device outside of the clinic excluding No Patient Has Alerts: Yes cellular tissue based products placed in the center Patient Alerts: Patient on Blood Thinner since last visit: Has Dressing in Place as Prescribed: Yes Pain Present Now: No Electronic Signature(s) Signed: 01/07/2022 4:47:37 PM By: Samuella Bruin Entered By: Samuella Bruin on 01/07/2022 12:46:00 -------------------------------------------------------------------------------- Encounter Discharge Information Details Patient Name: Date of Service: NA Colin Nephew A. 01/07/2022 12:45 PM Medical Record Number: [id number] Patient Account Number: [id number] Date of Birth/Sex: Treating RN: 05-30-1956 (66 y.o. [name] Primary Care Maurie Musco: [name] Other Clinician: Referring Weda Baumgarner: Treating Kanton Kamel/Extender: [name] in Treatment: 19 Encounter Discharge Information Items Post  Procedure Vitals Discharge Condition: Stable Temperature (F): 97.1 Ambulatory Status: Knee Scooter Pulse (bpm): 68 Discharge Destination: Home Respiratory Rate (breaths/min): 16 Transportation: Private Auto Blood Pressure (mmHg): 155/83 Accompanied By: self Schedule Follow-up Appointment: Yes Clinical Summary of Care: Patient Declined Electronic Signature(s) Signed: 01/07/2022 4:47:37 PM By: Samuella Bruin Entered By: Samuella Bruin on 01/07/2022 14:33:13 -------------------------------------------------------------------------------- Lower Extremity Assessment Details Patient Name: Date of Service: Colin Fanning A. 01/07/2022 12:45 PM Medical Record Number: [id number] Patient Account Number: [id number] Date of Birth/Sex: Treating RN: 14-Mar-1956 (66 y.o. [name] Primary Care Jayan Raymundo: [name] Other Clinician: Referring Jarrett Chicoine: Treating Dorotha Hirschi/Extender: [name]: 19 Edema Assessment Assessed: [name]: No] [name]: No] Edema: [Left: Ye] [Right: s] Calf Left: Right: Point of Measurement: From Medial Instep 36.6 cm Ankle Left: Right: Point of Measurement: From Medial Instep 22.3 cm Vascular Assessment Pulses: Dorsalis Pedis Palpable: [Left:Yes] Electronic Signature(s) Signed: 01/07/2022 4:47:37 PM By: Samuella Bruin Entered By: Samuella Bruin on 01/07/2022 12:50:40 -------------------------------------------------------------------------------- Multi Wound Chart Details Patient Name: Date of Service: Colin Fanning A. 01/07/2022 12:45 PM Medical Record Number: [id number] Patient Account Number: [id number] Date of Birth/Sex: Treating RN: [birth year] (66 y.o. [name] Primary Care Lurena Naeve: [name] Other Clinician: Referring Aniketh Huberty: Treating Aashritha Miedema/Extender: [name]: 19 Vital Signs Height(in): 74 Capillary Blood Glucose(mg/dl):  193 Weight(lbs): 790 Pulse(bpm): 68 Body Mass Index(BMI): 23.9 Blood Pressure(mmHg): 155/83 Temperature(F): 97.1 Respiratory Rate(breaths/min): 16 Photos: [1:Left, Medial Foot] [N/A:N/A N/A] Wound Location: [1:Gradually Appeared] [N/A:N/A] Wounding Event: [1:Diabetic Wound/Ulcer of the Lower] [N/A:N/A] Primary Etiology: [1:Extremity Cataracts, Coronary Artery Disease, N/A] Comorbid History: [1:Hypertension, Myocardial Infarction, Peripheral Arterial Disease, Type II Diabetes, Osteomyelitis, Neuropathy 02/22/2021] [N/A:N/A] Date Acquired: [1:19] [N/A:N/A] Weeks of Treatment: [1:Open] [N/A:N/A] Wound Status: [1:No] [N/A:N/A] Wound Recurrence: [1:6.3x2.7x0.2] [N/A:N/A] Measurements L x W x D (cm) [1:13.36] [N/A:N/A] A (cm) : rea [1:2.672] [N/A:N/A] Volume (cm) : [1:5.50%] [N/A:N/A] % Reduction in A [1:rea: 81.10%] [N/A:N/A] %  Reduction in Volume: [1:12] Position 1 (o'clock): [1:2] Maximum Distance 1 (cm): [1:Yes] [N/A:N/A] Tunneling: [1:Grade 3] [N/A:N/A] Classification: [1:Medium] [N/A:N/A] Exudate A mount: [1:Serosanguineous] [N/A:N/A] Exudate Type: [1:red, brown] [N/A:N/A] Exudate Color: [1:Distinct, outline attached] [N/A:N/A] Wound Margin: [1:Large (67-100%)] [N/A:N/A] Granulation A mount: [1:Red, Hyper-granulation] [N/A:N/A] Granulation Quality: [1:Small (1-33%)] [N/A:N/A] Necrotic A mount: [1:Fat Layer (Subcutaneous Tissue): Yes N/A] Exposed Structures: [1:Fascia: No Tendon: No Muscle: No Joint: No Bone: No Small (1-33%)] [N/A:N/A] Epithelialization: [1:Debridement - Selective/Open Wound N/A] Debridement: Pre-procedure Verification/Time Out 12:58 [N/A:N/A] Taken: [1:Slough] [N/A:N/A] Tissue Debrided: [1:Non-Viable Tissue] [N/A:N/A] Level: [1:1] [N/A:N/A] Debridement A (sq cm): [1:rea Curette] [N/A:N/A] Instrument: [1:Minimum] [N/A:N/A] Bleeding: [1:Pressure] [N/A:N/A] Hemostasis A chieved: [1:0] [N/A:N/A] Procedural Pain: [1:0] [N/A:N/A] Post Procedural Pain:  [1:Procedure was tolerated well] [N/A:N/A] Debridement Treatment Response: [1:6.3x2.7x0.2] [N/A:N/A] Post Debridement Measurements L x W x D (cm) [1:2.672] [N/A:N/A] Post Debridement Volume: (cm) [1:Debridement] [N/A:N/A] Treatment Notes Electronic Signature(s) Signed: 01/07/2022 1:05:51 PM By: [name] MD FACS Signed: 01/07/2022 4:47:37 PM By: Samuella Bruin Entered By: [name] on 01/07/2022 13:05:51 -------------------------------------------------------------------------------- Multi-Disciplinary Care Plan Details Patient Name: Date of Service: Colin Fanning A. 01/07/2022 12:45 PM Medical Record Number: [id number] Patient Account Number: [id number] Date of Birth/Sex: Treating RN: 1955-06-20 (66 y.o. [name] Primary Care Matisse Roskelley: [name] Other Clinician: Referring Jesly Hartmann: Treating Doretta Remmert/Extender: [name] in Treatment: 19 Multidisciplinary Care Plan reviewed with physician Active Inactive HBO Nursing Diagnoses: Anxiety related to feelings of confinement associated with the hyperbaric oxygen chamber Anxiety related to knowledge deficit of hyperbaric oxygen therapy and treatment procedures Discomfort related to temperature and humidity changes inside hyperbaric chamber Potential for barotraumas to ears, sinuses, teeth, and lungs or cerebral gas embolism related to changes in atmospheric pressure inside hyperbaric oxygen chamber Potential for oxygen toxicity seizures related to delivery of 100% oxygen at an increased atmospheric pressure Potential for pulmonary oxygen toxicity related to delivery of 100% oxygen at an increased atmospheric pressure Goals: Barotrauma will be prevented during HBO2 Date Initiated: 09/17/2021 T arget Resolution Date: 02/11/2022 Goal Status: Active Patient will tolerate the hyperbaric oxygen therapy treatment Date Initiated: 09/17/2021 T arget Resolution Date: 02/11/2022 Goal Status:  Active Patient will tolerate the internal climate of the chamber Date Initiated: 09/17/2021 T arget Resolution Date: 02/11/2022 Goal Status: Active Patient/caregiver will verbalize understanding of HBO goals, rationale, procedures and potential hazards Date Initiated: 09/17/2021 T arget Resolution Date: 02/11/2022 Goal Status: Active Signs and symptoms of pulmonary oxygen toxicity will be recognized and promptly addressed Date Initiated: 09/17/2021 T arget Resolution Date: 02/11/2022 Goal Status: Active Signs and symptoms of seizure will be recognized and promptly addressed ; seizing patients will suffer no harm Date Initiated: 09/17/2021 T arget Resolution Date: 02/11/2022 Goal Status: Active Interventions: Administer decongestants, per physician orders, prior to HBO2 Administer the correct therapeutic gas delivery based on the patients needs and limitations, per physician order Assess and provide for patients comfort related to the hyperbaric environment and equalization of middle ear Assess for signs and symptoms related to adverse events, including but not limited to confinement anxiety, pneumothorax, oxygen toxicity and baurotrauma Assess patient for any history of confinement anxiety Assess patient's knowledge and expectations regarding hyperbaric medicine and provide education related to the hyperbaric environment, goals of treatment and prevention of adverse events Implement protocols to decrease risk of pneumothorax in high risk patients Notes: Nutrition Nursing Diagnoses: Impaired glucose control: actual or potential Goals: Patient/caregiver verbalizes understanding of need to maintain therapeutic glucose control per primary care  physician Date Initiated: 08/25/2021 Target Resolution Date: 02/11/2022 Goal Status: Active Interventions: Assess HgA1c results as ordered upon admission and as needed Provide education on elevated blood sugars and impact on wound  healing Notes: Osteomyelitis Nursing Diagnoses: Infection: osteomyelitis Knowledge deficit related to disease process and management Goals: Patient's osteomyelitis will resolve Date Initiated: 09/17/2021 Target Resolution Date: 02/11/2022 Goal Status: Active Interventions: Assess for signs and symptoms of osteomyelitis resolution every visit Provide education on osteomyelitis Treatment Activities: Surgical debridement : 09/17/2021 Systemic antibiotics : 09/17/2021 T ordered outside of clinic : 09/17/2021 est Notes: Electronic Signature(s) Signed: 01/07/2022 4:47:37 PM By: Samuella Bruin Entered By: Samuella Bruin on 01/07/2022 12:53:38 -------------------------------------------------------------------------------- Pain Assessment Details Patient Name: Date of Service: Colin Fanning A. 01/07/2022 12:45 PM Medical Record Number: [id number] Patient Account Number: [id number] Date of Birth/Sex: Treating RN: May 26, 1956 (66 y.o. [name] Primary Care Terrea Bruster: [name] Other Clinician: Referring Lawson Mahone: Treating Kellie Chisolm/Extender: [name]: 19 Active Problems Location of Pain Severity and Description of Pain Patient Has Paino No Site Locations Rate the pain. Current Pain Level: 0 Pain Management and Medication Current Pain Management: Electronic Signature(s) Signed: 01/07/2022 4:47:37 PM By: Samuella Bruin Entered By: Samuella Bruin on 01/07/2022 12:46:29 -------------------------------------------------------------------------------- Patient/Caregiver Education Details Patient Name: Date of Service: NA Colin Nephew A. 7/28/2023andnbsp12:45 PM Medical Record Number: [id number] Patient Account Number: [id number] Date of Birth/Gender: Treating RN: [birth year] (65 y.o. [name] Primary Care Physician: [name] Other Clinician: Referring Physician: Treating Physician/Extender: [name] in Treatment: 19 Education Assessment Education Provided To: Patient Education Topics Provided Wound/Skin Impairment: Methods: Explain/Verbal Responses: Reinforcements needed, State content correctly Electronic Signature(s) Signed: 01/07/2022 4:47:37 PM By: Samuella Bruin Entered By: Samuella Bruin on 01/07/2022 12:53:49 -------------------------------------------------------------------------------- Wound Assessment Details Patient Name: Date of Service: Colin Fanning A. 01/07/2022 12:45 PM Medical Record Number: [id number] Patient Account Number: [id number] Date of Birth/Sex: Treating RN: 06-05-1956 (66 y.o. [name] Primary Care Shatonia Hoots: [name] Other Clinician: Referring Klay Sobotka: Treating Susana Gripp/Extender: [name]: 19 Wound Status Wound Number: 1 Primary Diabetic Wound/Ulcer of the Lower Extremity Etiology: Wound Location: Left, Medial Foot Wound Open Wounding Event: Gradually Appeared Status: Date Acquired: 02/22/2021 Comorbid Cataracts, Coronary Artery Disease, Hypertension, Myocardial Weeks Of Treatment: 19 History: Infarction, Peripheral Arterial Disease, Type II Diabetes, Clustered Wound: No Osteomyelitis, Neuropathy Photos Wound Measurements Length: (cm) 6.3 Width: (cm) 2.7 Depth: (cm) 0.2 Area: (cm) 13.36 Volume: (cm) 2.672 % Reduction in Area: 5.5% % Reduction in Volume: 81.1% Epithelialization: Small (1-33%) Tunneling: Yes Position (o'clock): 12 Maximum Distance: (cm) 2 Undermining: No Wound Description Classification: Grade 3 Wound Margin: Distinct, outline attached Exudate Amount: Medium Exudate Type: Serosanguineous Exudate Color: red, brown Wound Bed Granulation Amount: Large (67-100%) Granulation Quality: Red, Hyper-granulation Necrotic Amount: Small (1-33%) Necrotic Quality: Adherent Slough Foul Odor After Cleansing: No Slough/Fibrino  Yes Exposed Structure Fascia Exposed: No Fat Layer (Subcutaneous Tissue) Exposed: Yes Tendon Exposed: No Muscle Exposed: No Joint Exposed: No Bone Exposed: No Treatment Notes Wound #1 (Foot) Wound Laterality: Left, Medial Cleanser Wound Cleanser Discharge Instruction: Cleanse the wound with wound cleanser prior to applying a clean dressing using gauze sponges, not tissue or cotton balls. Peri-Wound Care Zinc Oxide Ointment 30g tube Discharge Instruction: Apply Zinc Oxide to periwound with each dressing change Topical Primary Dressing Promogran Prisma Matrix, 4.34 (sq in) (silver collagen) Discharge Instruction: Moisten collagen with saline or hydrogel Secondary Dressing ABD Pad,  5x9 Discharge Instruction: In clinic. Home Health to apply wound vac. Woven Gauze Sponge, Non-Sterile 4x4 in Discharge Instruction: In clinic. Home Health to apply wound vac. Secured With [employer], 4.5x3.1 (in/yd) Discharge Instruction: In clinic. Home Health to apply wound vac. 82M Medipore H Soft Cloth Surgical T ape, 4 x 10 (in/yd) Discharge Instruction: In clinic. Home Health to apply wound vac. Compression Wrap Compression Stockings Add-Ons Electronic Signature(s) Signed: 01/07/2022 4:47:37 PM By: Samuella Bruin Signed: 01/07/2022 5:58:04 PM By: Karie Schwalbe RN Entered By: Karie Schwalbe on 01/07/2022 12:55:08 -------------------------------------------------------------------------------- Vitals Details Patient Name: Date of Service: NA Colin Nephew A. 01/07/2022 12:45 PM Medical Record Number: [id number] Patient Account Number: [id number] Date of Birth/Sex: Treating RN: [birth year] (66 y.o. [name] Primary Care Jaramiah Bossard: [name] Other Clinician: Referring Breiona Couvillon: Treating Tika Hannis/Extender: [name]: 19 Vital Signs Time Taken: 12:15 Temperature (F): 97.1 Height (in): 74 Pulse (bpm): 68 Weight (lbs):  186 Respiratory Rate (breaths/min): 16 Body Mass Index (BMI): 23.9 Blood Pressure (mmHg): 155/83 Capillary Blood Glucose (mg/dl): 694 Reference Range: 80 - 120 mg / dl Electronic Signature(s) Signed: 01/07/2022 4:47:37 PM By: Samuella Bruin Entered By: Samuella Bruin on 01/07/2022 12:46:23

## 2022-01-07 NOTE — Progress Notes (Addendum)
EINER, MEALS ([id number]) Visit Report for 01/07/2022 Arrival Information Details Patient Name: Date of Service: NA [name] 01/07/2022 10:00 A M Medical Record Number: [id number] Patient Account Number: [id number] Date of Birth/Sex: Treating RN: 1955/07/29 (66 y.o. [name] Primary Care Deniyah Dillavou: [name] Other Clinician: [name] Referring Caiden Arteaga: Treating Aristidis Talerico/Extender: [name] in Treatment: 19 Visit Information History Since Last Visit All ordered tests and consults were completed: Yes Patient Arrived: Knee Scooter Added or deleted any medications: No Arrival Time: 09:14 Any new allergies or adverse reactions: No Accompanied By: Wife Had a fall or experienced change in No Transfer Assistance: None activities of daily living that may affect Patient Identification Verified: Yes risk of falls: Secondary Verification Process Completed: Yes Signs or symptoms of abuse/neglect since last visito No Patient Requires Transmission-Based Precautions: No Hospitalized since last visit: No Patient Has Alerts: Yes Implantable device outside of the clinic excluding No Patient Alerts: Patient on Blood Thinner cellular tissue based products placed in the center since last visit: Pain Present Now: No Electronic Signature(s) Signed: 01/07/2022 11:28:56 AM By: [name] EMT Entered By: [name] on 01/07/2022 11:28:55 -------------------------------------------------------------------------------- Encounter Discharge Information Details Patient Name: Date of Service: NA [name] 01/07/2022 10:00 A M Medical Record Number: [id number] Patient Account Number: [id number] Date of Birth/Sex: Treating RN: [birth year] (66 y.o. [name] Primary Care Catelyn Friel: [name] Other Clinician: [name] Referring Aleksandr Pellow: Treating Iylah Dworkin/Extender: [name] in Treatment: 19 Encounter  Discharge Information Items Discharge Condition: Stable Ambulatory Status: Knee Scooter Discharge Destination: Other (Note Required) Transportation: Private Auto Accompanied By: Wife Schedule Follow-up Appointment: No Clinical Summary of Care: Notes The patient has a Wound Care Clinic visit after treatment. Electronic Signature(s) Signed: 01/07/2022 1:12:51 PM By: [name] EMT Entered By: [name] on 01/07/2022 13:12:51 -------------------------------------------------------------------------------- Vitals Details Patient Name: Date of Service: NA [name] 01/07/2022 10:00 A M Medical Record Number: [id number] Patient Account Number: [id number] Date of Birth/Sex: Treating RN: 02-05-1956 (66 y.o. [name] Primary Care Lougenia Morrissey: [name] Other Clinician: [name] Referring Maleke Feria: Treating Orhan Mayorga/Extender: [name]: 19 Vital Signs Time Taken: 09:44 Temperature (F): 98.4 Height (in): 74 Pulse (bpm): 74 Weight (lbs): 186 Respiratory Rate (breaths/min): 16 Body Mass Index (BMI): 23.9 Blood Pressure (mmHg): 129/76 Capillary Blood Glucose (mg/dl): 706 Reference Range: 80 - 120 mg / dl Electronic Signature(s) Signed: 01/07/2022 11:29:34 AM By: [name] EMT Entered By: [name] on 01/07/2022 11:29:34

## 2022-01-07 NOTE — Progress Notes (Signed)
JAYVION, STEFANSKI ([id number]) Visit Report for 01/07/2022 Problem List Details Patient Name: Date of Service: NA [name] 01/07/2022 10:00 A M Medical Record Number: [id number] Patient Account Number: [id number] Date of Birth/Sex: Treating RN: 10-Feb-1956 (66 y.o. 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 01/07/2022 Medical Record Number: [id number] Patient Account Number: [id number] Date of Birth/Sex: Treating RN: Oct 06, 1955 (66 y.o. 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 Facility Procedures CPT4 Code: [id number] Description: G0277-(Facility Use Only) HBOT full body chamber, , ICD-10 Diagnosis Description E11.621 Type 2 diabetes mellitus with foot ulcer 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 M86.672 Other chronic  osteomyelitis, left ankle and foot 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 Modifier: Quantity: 4 Physician Procedures : CPT4 Code Description Modifier [id number] - WC PHYS HYPERBARIC OXYGEN THERAPY ICD-10 Diagnosis Description E11.621 Type 2 diabetes mellitus with foot ulcer 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

## 2022-01-07 NOTE — Progress Notes (Addendum)
Colin, [name] ([id number]) Visit Report for 01/07/2022 HBO Details Patient Name: Date of Service: NA [name] 01/07/2022 10:00 A M Medical Record Number: [id number] Patient Account Number: [id number] Date of Birth/Sex: Treating RN: 1955/07/31 (66 y.o. [name] Primary Care Unknown Flannigan: [name] Other Clinician: [name] Referring [name]: Treating Keonda Dow/Extender: [name]: 19 HBO Treatment Course Details Treatment Course Number: 1 Ordering Almee Pelphrey: [name] T Treatments Ordered: otal 120 HBO Treatment Start Date: 09/20/2021 HBO Indication: Diabetic Ulcer(s) of the Lower Extremity Standard/Conservative Wound Care tried and failed greater than or equal to 30 days HBO Treatment Details Treatment Number: 75 Patient Type: Outpatient Chamber Type: Monoplace Chamber Serial #: [id number] Treatment Protocol: 2.0 ATA with 90 minutes oxygen, with two 5 minute air breaks Treatment Details Compression Rate Down: 1.0 psi / minute De-Compression Rate Up: 1.0 psi / minute A breaks and breathing ir Compress Tx Pressure periods Decompress Decompress Begins Reached (leave unused spaces Begins Ends blank) Chamber Pressure (ATA 1 2 2 2 2 2  --2 1 ) Clock Time (24 hr) 10:07 10:21 10:51 10:56 11:26 11:31 - - 12:01 12:11 Treatment Length: 124 (minutes) Treatment Segments: 4 Vital Signs Capillary Blood Glucose Reference Range: 80 - 120 mg / dl HBO Diabetic Blood Glucose Intervention Range: <131 mg/dl or mg/dl Time Vitals Blood Respiratory Capillary Blood Glucose Pulse Action Type: Pulse: Temperature: Taken: Pressure: Rate: Glucose (mg/dl): Meter #: Oximetry (%) Taken: Pre 09:44 129/76 74 16 98.4 271 Post 12:15 155/83 68 16 97.1 229 Treatment Response Treatment Toleration: Well Treatment Completion Status: Treatment Completed without Adverse Event Treatment Notes The patient has a Wound Care Clinic visit after  his treatment today. Additional Procedure Documentation Tissue Sevierity: Necrosis of bone Physician HBO Attestation: I certify that I supervised this HBO treatment in accordance with Medicare guidelines. A trained emergency response team is readily available per Yes hospital policies and procedures. Continue HBOT as ordered. Yes Electronic Signature(s) Signed: 01/07/2022 4:03:18 PM By: 01/09/2022 MD FACS Previous Signature: 01/07/2022 1:08:13 PM Version By: 01/09/2022 EMT Previous Signature: 01/07/2022 12:04:38 PM Version By: 01/09/2022 MD FACS Previous Signature: 01/07/2022 11:34:19 AM Version By: 01/09/2022 EMT Entered By: [name] on 01/07/2022 16:03:17 -------------------------------------------------------------------------------- HBO Safety Checklist Details Patient Name: Date of Service: NA 01/09/2022 A. 01/07/2022 10:00 A M Medical Record Number: 01/09/2022 Patient Account Number: [id number] Date of Birth/Sex: Treating RN: 1956/04/29 (66 y.o. 77 Primary Care Saw Mendenhall: [name] Other Clinician: [name] Referring [name]: Treating Kensington Duerst/Extender: [name]: 19 HBO Safety Checklist Items Safety Checklist Consent Form Signed Patient voided / foley secured and emptied When did you last eato 0715 Last dose of injectable or oral agent 0715 Ostomy pouch emptied and vented if applicable NA All implantable devices assessed, documented and approved NA Intravenous access site secured and place NA Valuables secured Linens and cotton and cotton/polyester blend (less than 51% polyester) Personal oil-based products / skin lotions / body lotions removed Wigs or hairpieces removed NA Smoking or tobacco materials removed Books / newspapers / magazines / loose paper removed Cologne, aftershave, perfume and deodorant removed Jewelry removed (may wrap wedding band) NA Make-up  removed NA Hair care products removed Battery operated devices (external) removed Heating patches and chemical warmers removed Titanium eyewear removed NA Nail polish cured greater than 10 hours NA Casting material cured greater than 10 hours NA Hearing aids removed NA Loose dentures or partials  removed NA Prosthetics have been removed NA Patient demonstrates correct use of air break device (if applicable) Patient concerns have been addressed Patient grounding bracelet on and cord attached to chamber Specifics for Inpatients (complete in addition to above) Medication sheet sent with patient NA Intravenous medications needed or due during therapy sent with patient NA Drainage tubes (e.g. nasogastric tube or chest tube secured and vented) NA Endotracheal or Tracheotomy tube secured NA Cuff deflated of air and inflated with saline NA Airway suctioned NA Notes The safety checklist was done before the treatment was started. Electronic Signature(s) Signed: 01/07/2022 11:31:16 AM By: [name] EMT Entered By: [name] on 01/07/2022 11:31:16

## 2022-01-08 DIAGNOSIS — T8131XA Disruption of external operation (surgical) wound, not elsewhere classified, initial encounter: Secondary | ICD-10-CM | POA: Diagnosis not present

## 2022-01-09 DIAGNOSIS — T8131XA Disruption of external operation (surgical) wound, not elsewhere classified, initial encounter: Secondary | ICD-10-CM | POA: Diagnosis not present

## 2022-01-10 ENCOUNTER — Encounter (HOSPITAL_BASED_OUTPATIENT_CLINIC_OR_DEPARTMENT_OTHER): Payer: BC Managed Care – PPO | Admitting: General Surgery

## 2022-01-10 DIAGNOSIS — E1140 Type 2 diabetes mellitus with diabetic neuropathy, unspecified: Secondary | ICD-10-CM | POA: Diagnosis not present

## 2022-01-10 DIAGNOSIS — E11610 Type 2 diabetes mellitus with diabetic neuropathic arthropathy: Secondary | ICD-10-CM | POA: Diagnosis not present

## 2022-01-10 DIAGNOSIS — D649 Anemia, unspecified: Secondary | ICD-10-CM | POA: Diagnosis not present

## 2022-01-10 DIAGNOSIS — Z951 Presence of aortocoronary bypass graft: Secondary | ICD-10-CM | POA: Diagnosis not present

## 2022-01-10 DIAGNOSIS — Z794 Long term (current) use of insulin: Secondary | ICD-10-CM | POA: Diagnosis not present

## 2022-01-10 DIAGNOSIS — L97324 Non-pressure chronic ulcer of left ankle with necrosis of bone: Secondary | ICD-10-CM | POA: Diagnosis not present

## 2022-01-10 DIAGNOSIS — I2510 Atherosclerotic heart disease of native coronary artery without angina pectoris: Secondary | ICD-10-CM | POA: Diagnosis not present

## 2022-01-10 DIAGNOSIS — M1990 Unspecified osteoarthritis, unspecified site: Secondary | ICD-10-CM | POA: Diagnosis not present

## 2022-01-10 DIAGNOSIS — T8131XA Disruption of external operation (surgical) wound, not elsewhere classified, initial encounter: Secondary | ICD-10-CM | POA: Diagnosis not present

## 2022-01-10 DIAGNOSIS — L97322 Non-pressure chronic ulcer of left ankle with fat layer exposed: Secondary | ICD-10-CM | POA: Diagnosis not present

## 2022-01-10 DIAGNOSIS — E11621 Type 2 diabetes mellitus with foot ulcer: Secondary | ICD-10-CM | POA: Diagnosis not present

## 2022-01-10 DIAGNOSIS — I252 Old myocardial infarction: Secondary | ICD-10-CM | POA: Diagnosis not present

## 2022-01-10 DIAGNOSIS — M86672 Other chronic osteomyelitis, left ankle and foot: Secondary | ICD-10-CM | POA: Diagnosis not present

## 2022-01-10 DIAGNOSIS — K219 Gastro-esophageal reflux disease without esophagitis: Secondary | ICD-10-CM | POA: Diagnosis not present

## 2022-01-10 DIAGNOSIS — Z7984 Long term (current) use of oral hypoglycemic drugs: Secondary | ICD-10-CM | POA: Diagnosis not present

## 2022-01-10 DIAGNOSIS — E11622 Type 2 diabetes mellitus with other skin ulcer: Secondary | ICD-10-CM | POA: Diagnosis not present

## 2022-01-10 DIAGNOSIS — I10 Essential (primary) hypertension: Secondary | ICD-10-CM | POA: Diagnosis not present

## 2022-01-10 DIAGNOSIS — Z7982 Long term (current) use of aspirin: Secondary | ICD-10-CM | POA: Diagnosis not present

## 2022-01-10 DIAGNOSIS — E785 Hyperlipidemia, unspecified: Secondary | ICD-10-CM | POA: Diagnosis not present

## 2022-01-10 DIAGNOSIS — Z9181 History of falling: Secondary | ICD-10-CM | POA: Diagnosis not present

## 2022-01-10 LAB — GLUCOSE, CAPILLARY
Glucose-Capillary: 191 mg/dL — ABNORMAL HIGH (ref 70–99)
Glucose-Capillary: 188 mg/dL — ABNORMAL HIGH (ref 70–99)

## 2022-01-10 NOTE — Progress Notes (Signed)
MAGGIE, DWORKIN ([id number]) Visit Report for 01/10/2022 SuperBill Details Patient Name: Date of Service: NA [name] 01/10/2022 Medical Record Number: [id number] Patient Account Number: [id number] Date of Birth/Sex: Treating RN: 09-25-1955 (66 y.o. 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 Facility Procedures CPT4 Code Description Modifier Quantity [id number]-(Facility Use Only) HBOT full body chamber, , 4 ICD-10 Diagnosis Description E11.621 Type 2 diabetes mellitus with foot ulcer L97.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 M86.672 Other chronic osteomyelitis, left ankle and foot 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

## 2022-01-10 NOTE — Progress Notes (Addendum)
KEDRON, UNO ([id number]) Visit Report for 01/10/2022 HBO Details Patient Name: Date of Service: NA [name] 01/10/2022 10:00 A M Medical Record Number: [id number] Patient Account Number: [id number] Date of Birth/Sex: Treating RN: 16-May-1956 (66 y.o. [name] Primary Care Azlaan Isidore: [name] Other Clinician: [name] Referring Orlie Cundari: Treating Evalette Montrose/Extender: [name]: 19 HBO Treatment Course Details Treatment Course Number: 1 Ordering Timber Lucarelli: [name] T Treatments Ordered: otal 120 HBO Treatment Start Date: 09/20/2021 HBO Indication: Diabetic Ulcer(s) of the Lower Extremity Standard/Conservative Wound Care tried and failed greater than or equal to 30 days HBO Treatment Details Treatment Number: 76 Patient Type: Outpatient Chamber Type: Monoplace Chamber Serial #: [id number] Treatment Protocol: 2.0 ATA with 90 minutes oxygen, with two 5 minute air breaks Treatment Details Compression Rate Down: 2.0 psi / minute De-Compression Rate Up: 2.0 psi / minute A breaks and breathing ir Compress Tx Pressure periods Decompress Decompress Begins Reached (leave unused spaces Begins Ends blank) Chamber Pressure (ATA 1 2 2 2 2 2  --2 1 ) Clock Time (24 hr) 10:29 10:41 11:11 11:16 11:46 11:51 - - 12:21 12:32 Treatment Length: 123 (minutes) Treatment Segments: 4 Vital Signs Capillary Blood Glucose Reference Range: 80 - 120 mg / dl HBO Diabetic Blood Glucose Intervention Range: <131 mg/dl or mg/dl Type: Time Vitals Blood Respiratory Capillary Blood Glucose Pulse Action Pulse: Temperature: Taken: Pressure: Rate: Glucose (mg/dl): Meter #: Oximetry (%) Taken: Pre 10:01 104/65 77 16 98 191 1 none per protocol Post 12:35 156/91 66 16 98.2 188 1 none per protocol Treatment Response Treatment Toleration: Well Treatment Completion Status: Treatment Completed without Adverse Event Additional Procedure  Documentation Tissue Sevierity: Necrosis of bone Physician HBO Attestation: I certify that I supervised this HBO treatment in accordance with Medicare guidelines. A trained emergency response team is readily available per Yes hospital policies and procedures. Continue HBOT as ordered. Yes Electronic Signature(s) Signed: 01/10/2022 1:49:06 PM By: 01/12/2022 MD FACS Previous Signature: 01/10/2022 1:03:55 PM Version By: 01/12/2022 CHT EMT BS , , Entered By: [name] on 01/10/2022 13:49:06 -------------------------------------------------------------------------------- HBO Safety Checklist Details Patient Name: Date of Service: 01/12/2022 A. 01/10/2022 10:00 A M Medical Record Number: 01/12/2022 Patient Account Number: [id number] Date of Birth/Sex: Treating RN: 01/08/1956 (66 y.o. 77 Primary Care Laurielle Selmon: [name] Other Clinician: [name] Referring Marbin Olshefski: Treating Maecie Sevcik/Extender: [name] Weeks in Treatment: 19 HBO Safety Checklist Items Safety Checklist Consent Form Signed Patient voided / foley secured and emptied When did you last eato 0700 Last dose of injectable or oral agent 0700 Ostomy pouch emptied and vented if applicable NA All implantable devices assessed, documented and approved NA Intravenous access site secured and place NA Valuables secured Linens and cotton and cotton/polyester blend (less than 51% polyester) Personal oil-based products / skin lotions / body lotions removed Wigs or hairpieces removed NA Smoking or tobacco materials removed NA Books / newspapers / magazines / loose paper removed Cologne, aftershave, perfume and deodorant removed NA Jewelry removed (may wrap wedding band) Make-up removed Hair care products removed Battery operated devices (external) removed Heating patches and chemical warmers removed NA Titanium eyewear removed NA Nail polish cured  greater than 10 hours NA Casting material cured greater than 10 hours NA Hearing aids removed NA Loose dentures or partials removed NA Prosthetics have been removed NA Patient demonstrates correct use of air break device (if applicable) Patient concerns have been addressed  Patient grounding bracelet on and cord attached to chamber Specifics for Inpatients (complete in addition to above) Medication sheet sent with patient NA Intravenous medications needed or due during therapy sent with patient NA Drainage tubes (e.g. nasogastric tube or chest tube secured and vented) NA Endotracheal or Tracheotomy tube secured NA Cuff deflated of air and inflated with saline NA Airway suctioned NA Notes Paper version used prior to treatment. Electronic Signature(s) Signed: 01/10/2022 11:18:56 AM By: [name] CHT EMT BS , , Entered By: [name] on 01/10/2022 11:18:56

## 2022-01-10 NOTE — Progress Notes (Addendum)
[name] ([id number]) Visit Report for 01/10/2022 Arrival Information Details Patient Name: Date of Service: NA [name] 01/10/2022 10:00 A M Medical Record Number: [id number] Patient Account Number: [id number] Date of Birth/Sex: Treating RN: [birth year] (66 y.o. [name] Primary Care [place] [name] Other Clinician: Haywood [name] Referring [name]: Treating [name]/Extender: [name] in Treatment: 19 Visit Information History Since Last Visit All ordered tests and consults were completed: Yes Patient Arrived: Knee Scooter Added or deleted any medications: No Arrival Time: 10:05 Any new allergies or adverse reactions: No Accompanied By: spouse Had a fall or experienced change in No Transfer Assistance: None activities of daily living that may affect Patient Identification Verified: Yes risk of falls: Secondary Verification Process Completed: Yes Signs or symptoms of abuse/neglect since last visito No Patient Requires Transmission-Based Precautions: No Hospitalized since last visit: No Patient Has Alerts: Yes Implantable device outside of the clinic excluding No Patient Alerts: Patient on Blood Thinner cellular tissue based products placed in the center since last visit: Pain Present Now: No Electronic Signature(s) Signed: 01/10/2022 11:16:00 AM By: [name] CHT EMT BS , , Entered By: [name] on 01/10/2022 11:16:00 -------------------------------------------------------------------------------- Encounter Discharge Information Details Patient Name: Date of Service: Colin Fanning A. 01/10/2022 10:00 A M Medical Record Number: [id number] Patient Account Number: [id number] Date of Birth/Sex: Treating RN: 10/24/1955 (66 y.o. [name] Primary Care [name]: [name] Other Clinician: Haywood [name] Referring Damari Suastegui: Treating [name]: [name] in Treatment: 19 Encounter Discharge Information Items Discharge Condition: Stable Ambulatory Status: Knee Scooter Discharge Destination: Home Transportation: Private Auto Accompanied By: spouse Schedule Follow-up Appointment: No Clinical Summary of Care: Electronic Signature(s) Signed: 01/10/2022 1:04:45 PM By: [name] CHT EMT BS , , Entered By: [name] on 01/10/2022 13:04:45 -------------------------------------------------------------------------------- Vitals Details Patient Name: Date of Service: NA Colin Nephew A. 01/10/2022 10:00 A M Medical Record Number: [id number] Patient Account Number: [id number] Date of Birth/Sex: Treating RN: 1956/03/24 (66 y.o. [name] Primary Care Daneen Volcy: [name] Other Clinician: Haywood [name] Referring Heavenly Christine: Treating [name]/Extender: [name] Weeks in Treatment: 19 Vital Signs Time Taken: 10:01 Temperature (F): 98.0 Height (in): 74 Pulse (bpm): 77 Weight (lbs): 186 Respiratory Rate (breaths/min): 16 Body Mass Index (BMI): 23.9 Blood Pressure (mmHg): 104/65 Reference Range: 80 - 120 mg / dl Electronic Signature(s) Signed: 01/10/2022 11:17:48 AM By: [name] CHT EMT BS , , Entered By: [name] on 01/10/2022 11:17:47

## 2022-01-11 ENCOUNTER — Encounter (HOSPITAL_BASED_OUTPATIENT_CLINIC_OR_DEPARTMENT_OTHER): Payer: BC Managed Care – PPO | Admitting: General Surgery

## 2022-01-11 ENCOUNTER — Ambulatory Visit: Payer: BC Managed Care – PPO | Admitting: Cardiology

## 2022-01-11 DIAGNOSIS — T8131XA Disruption of external operation (surgical) wound, not elsewhere classified, initial encounter: Secondary | ICD-10-CM | POA: Diagnosis not present

## 2022-01-12 ENCOUNTER — Encounter (HOSPITAL_BASED_OUTPATIENT_CLINIC_OR_DEPARTMENT_OTHER): Payer: BC Managed Care – PPO | Admitting: Physician Assistant

## 2022-01-12 DIAGNOSIS — T8131XA Disruption of external operation (surgical) wound, not elsewhere classified, initial encounter: Secondary | ICD-10-CM | POA: Diagnosis not present

## 2022-01-12 DIAGNOSIS — I2510 Atherosclerotic heart disease of native coronary artery without angina pectoris: Secondary | ICD-10-CM | POA: Diagnosis not present

## 2022-01-12 DIAGNOSIS — E11610 Type 2 diabetes mellitus with diabetic neuropathic arthropathy: Secondary | ICD-10-CM | POA: Diagnosis not present

## 2022-01-12 DIAGNOSIS — I10 Essential (primary) hypertension: Secondary | ICD-10-CM | POA: Diagnosis not present

## 2022-01-12 DIAGNOSIS — D649 Anemia, unspecified: Secondary | ICD-10-CM | POA: Diagnosis not present

## 2022-01-12 DIAGNOSIS — Z7984 Long term (current) use of oral hypoglycemic drugs: Secondary | ICD-10-CM | POA: Diagnosis not present

## 2022-01-12 DIAGNOSIS — E785 Hyperlipidemia, unspecified: Secondary | ICD-10-CM | POA: Diagnosis not present

## 2022-01-12 DIAGNOSIS — M1990 Unspecified osteoarthritis, unspecified site: Secondary | ICD-10-CM | POA: Diagnosis not present

## 2022-01-12 DIAGNOSIS — K219 Gastro-esophageal reflux disease without esophagitis: Secondary | ICD-10-CM | POA: Diagnosis not present

## 2022-01-12 DIAGNOSIS — E1140 Type 2 diabetes mellitus with diabetic neuropathy, unspecified: Secondary | ICD-10-CM | POA: Diagnosis not present

## 2022-01-12 DIAGNOSIS — Z7982 Long term (current) use of aspirin: Secondary | ICD-10-CM | POA: Diagnosis not present

## 2022-01-12 DIAGNOSIS — L97322 Non-pressure chronic ulcer of left ankle with fat layer exposed: Secondary | ICD-10-CM | POA: Diagnosis not present

## 2022-01-12 DIAGNOSIS — Z9181 History of falling: Secondary | ICD-10-CM | POA: Diagnosis not present

## 2022-01-12 DIAGNOSIS — Z794 Long term (current) use of insulin: Secondary | ICD-10-CM | POA: Diagnosis not present

## 2022-01-12 DIAGNOSIS — I252 Old myocardial infarction: Secondary | ICD-10-CM | POA: Diagnosis not present

## 2022-01-12 DIAGNOSIS — Z951 Presence of aortocoronary bypass graft: Secondary | ICD-10-CM | POA: Diagnosis not present

## 2022-01-12 DIAGNOSIS — E11622 Type 2 diabetes mellitus with other skin ulcer: Secondary | ICD-10-CM | POA: Diagnosis not present

## 2022-01-13 ENCOUNTER — Ambulatory Visit (INDEPENDENT_AMBULATORY_CARE_PROVIDER_SITE_OTHER): Payer: BC Managed Care – PPO | Admitting: Vascular Surgery

## 2022-01-13 ENCOUNTER — Ambulatory Visit (HOSPITAL_COMMUNITY)
Admission: RE | Admit: 2022-01-13 | Discharge: 2022-01-13 | Disposition: A | Discharge status: Home / Self Care | Payer: BC Managed Care – PPO | Source: Ambulatory Visit | Attending: Vascular Surgery | Admitting: Vascular Surgery

## 2022-01-13 ENCOUNTER — Encounter: Payer: Self-pay | Admitting: Vascular Surgery

## 2022-01-13 VITALS — BP 169/96 | HR 102 | Temp 98.0°F | Resp 20 | Ht 74.0 in | Wt 182.0 lb

## 2022-01-13 DIAGNOSIS — I739 Peripheral vascular disease, unspecified: Secondary | ICD-10-CM | POA: Diagnosis not present

## 2022-01-13 DIAGNOSIS — T8131XA Disruption of external operation (surgical) wound, not elsewhere classified, initial encounter: Secondary | ICD-10-CM | POA: Diagnosis not present

## 2022-01-13 NOTE — Progress Notes (Signed)
ASSESSMENT & PLAN   OSTEOMYELITIS LEFT FOOT: This patient has an extensive wound of his left foot but is making excellent progress at the wound care center.  He currently has a VAC on the wound which is due to be changed tomorrow.  He was sent for evaluation of peripheral arterial disease.  Based on his noninvasive studies I think he likely has adequate circulation to heal the wounds on the left foot.  His toe pressure is 94 mmHg.  Typically a toe pressure of 50 should be adequate for healing.  He has a palpable femoral and popliteal pulse on the left.  I cannot palpate a posterior tibial pulse as his VAC dressing is in the way.  I cannot palpate a dorsalis pedis pulse.  Thus he may have some underlying tibial artery occlusive disease.  I explained that if the wound stops making progress then I would recommend that we proceed with arteriography to see if there is any tibial disease that we might be able to address with angioplasty.  However as long as the wounds making progress I think we can hold off on arteriography.  We also discussed the importance of leg elevation and the proper positioning for this.  He knows to call if the wound stops making progress.  REASON FOR CONSULT:    Nonhealing ulcer.  The consult is requested by Dr. Gwynn Burly.  HPI:   [name] is a 66 y.o. male who was referred for evaluation of peripheral arterial disease.  I have reviewed the records from the referring office.  The patient was seen on 12/28/2021 for follow-up of osteomyelitis.  He had presented with a diabetic left foot wound which was complicated by osteomyelitis.  When he was seen last he had completed 6 weeks of IV antibiotic therapy.  He was switched to Augmentin and doxycycline for 3 months.  On my history, the patient developed a wound on the left foot in September 2022.  This was an extensive wound and ultimately there was bone exposed.  He was diagnosed with osteomyelitis and has been treated with  long-term intravenous antibiotics and is now on p.o. antibiotics.  He is currently being treated at the wound care center and making good progress.  The bone is now covered.  He has a VAC on the wound and is also getting hyperbaric oxygen.  They are planning on a graft soon.  Prior to this he was very active and and denied any history of claudication or rest pain.  His risk factors for peripheral arterial disease include type 2 diabetes, hypertension, and hypercholesterolemia.  He has undergone previous coronary revascularization.  He does not believe any vein was taken from the leg except for short segment on the left side.  Importantly he is not a smoker.  He is on aspirin and is on a statin.   Past Medical History:  Diagnosis Date   Coronary artery disease    Diabetes mellitus without complication (HCC)    Type II   GERD (gastroesophageal reflux disease)    Hypertension    Myocardial infarction [facility])    "Silent"   Neuropathy     Family History  Problem Relation Age of Onset   Diabetes Mother    Diabetes Father    Heart disease Father    Diabetes Daughter     SOCIAL HISTORY: Social History   Tobacco Use   Smoking status: Never   Smokeless tobacco: Never  Substance Use Topics  Alcohol use: Never    Allergies  Allergen Reactions   Codeine Sulfate [Codeine] Nausea Only    Current Outpatient Medications  Medication Sig Dispense Refill   acetaminophen (TYLENOL) 500 MG tablet Take 2 tablets (1,000 mg total) by mouth every 6 (six) hours as needed for mild pain or fever. 30 tablet 0   amoxicillin-clavulanate (AUGMENTIN) 875-125 MG tablet Take 1 tablet by mouth 2 (two) times daily. 60 tablet 2   aspirin EC 81 MG tablet Take 81 mg by mouth daily.     atorvastatin (LIPITOR) 80 MG tablet Take 80 mg by mouth daily.     calcium carbonate (TUMS - DOSED IN MG ELEMENTAL CALCIUM) 500 MG chewable tablet Chew 1-2 tablets by mouth daily as needed for indigestion or heartburn.      doxycycline (VIBRA-TABS) 100 MG tablet Take 1 tablet (100 mg total) by mouth 2 (two) times daily. 60 tablet 2   ibuprofen (ADVIL) 200 MG tablet Take 400 mg by mouth every 8 (eight) hours as needed (pain.).     LANTUS 100 UNIT/ML injection Inject 30 Units into the skin 2 (two) times daily.     lisinopril (ZESTRIL) 10 MG tablet Take 10 mg by mouth in the morning.     metFORMIN (GLUCOPHAGE) 850 MG tablet Take 850 mg by mouth in the morning, at noon, and at bedtime.     metoprolol succinate (TOPROL-XL) 50 MG 24 hr tablet Take 50 mg by mouth daily. Take with or immediately following a meal.     sitaGLIPtin (JANUVIA) 100 MG tablet Take 100 mg by mouth every morning.      Tetrahydrozoline HCl (VISINE OP) Place 1 drop into both eyes daily as needed (redness).     No current facility-administered medications for this visit.    REVIEW OF SYSTEMS:  [X]  denotes positive finding, [ ]  denotes negative finding Cardiac  Comments:  Chest pain or chest pressure:    Shortness of breath upon exertion:    Short of breath when lying flat:    Irregular heart rhythm:        Vascular    Pain in calf, thigh, or hip brought on by ambulation:    Pain in feet at night that wakes you up from your sleep:  x   Blood clot in your veins:    Leg swelling:         Pulmonary    Oxygen at home:    Productive cough:     Wheezing:         Neurologic    Sudden weakness in arms or legs:     Sudden numbness in arms or legs:     Sudden onset of difficulty speaking or slurred speech:    Temporary loss of vision in one eye:     Problems with dizziness:         Gastrointestinal    Blood in stool:     Vomited blood:         Genitourinary    Burning when urinating:     Blood in urine:        Psychiatric    Major depression:         Hematologic    Bleeding problems:    Problems with blood clotting too easily:        Skin    Rashes or ulcers:        Constitutional    Fever or chills:    -  PHYSICAL EXAM:  Vitals:   01/13/22 1240  BP: (!) 169/96  Pulse: (!) 102  Resp: 20  Temp: 98 F (36.7 C)  SpO2: 98%  Weight: 182 lb (82.6 kg)  Height: 6\' 2"  (1.88 m)   Body mass index is 23.37 kg/m. GENERAL: The patient is a well-nourished male, in no acute distress. The vital signs are documented above. CARDIAC: There is a regular rate and rhythm.  VASCULAR: I do not detect carotid bruits. He has palpable femoral and popliteal pulses bilaterally.  I cannot palpate pedal pulses. He has no significant lower extremity swelling. PULMONARY: There is good air exchange bilaterally without wheezing or rales. MUSCULOSKELETAL: There are no major deformities. NEUROLOGIC: No focal weakness or paresthesias are detected. SKIN: He has a VAC on the wound on the medial aspect of his left foot. PSYCHIATRIC: The patient has a normal affect.  DATA:    ARTERIAL DOPPLER STUDY: I have independently interpreted his arterial Doppler study today.  On the right side there is a biphasic dorsalis pedis and posterior tibial signal.  ABIs 100%.  Toe pressures 117 mmHg.  On the left side there is a biphasic dorsalis pedis and posterior tibial signal.  ABIs 95%.  Toe pressure is 94 mmHg.  [facility]

## 2022-01-14 DIAGNOSIS — Z7982 Long term (current) use of aspirin: Secondary | ICD-10-CM | POA: Diagnosis not present

## 2022-01-14 DIAGNOSIS — E11622 Type 2 diabetes mellitus with other skin ulcer: Secondary | ICD-10-CM | POA: Diagnosis not present

## 2022-01-14 DIAGNOSIS — Z7984 Long term (current) use of oral hypoglycemic drugs: Secondary | ICD-10-CM | POA: Diagnosis not present

## 2022-01-14 DIAGNOSIS — T8131XA Disruption of external operation (surgical) wound, not elsewhere classified, initial encounter: Secondary | ICD-10-CM | POA: Diagnosis not present

## 2022-01-14 DIAGNOSIS — Z951 Presence of aortocoronary bypass graft: Secondary | ICD-10-CM | POA: Diagnosis not present

## 2022-01-14 DIAGNOSIS — K219 Gastro-esophageal reflux disease without esophagitis: Secondary | ICD-10-CM | POA: Diagnosis not present

## 2022-01-14 DIAGNOSIS — M1990 Unspecified osteoarthritis, unspecified site: Secondary | ICD-10-CM | POA: Diagnosis not present

## 2022-01-14 DIAGNOSIS — L97322 Non-pressure chronic ulcer of left ankle with fat layer exposed: Secondary | ICD-10-CM | POA: Diagnosis not present

## 2022-01-14 DIAGNOSIS — D649 Anemia, unspecified: Secondary | ICD-10-CM | POA: Diagnosis not present

## 2022-01-14 DIAGNOSIS — Z9181 History of falling: Secondary | ICD-10-CM | POA: Diagnosis not present

## 2022-01-14 DIAGNOSIS — I2510 Atherosclerotic heart disease of native coronary artery without angina pectoris: Secondary | ICD-10-CM | POA: Diagnosis not present

## 2022-01-14 DIAGNOSIS — E1140 Type 2 diabetes mellitus with diabetic neuropathy, unspecified: Secondary | ICD-10-CM | POA: Diagnosis not present

## 2022-01-14 DIAGNOSIS — E785 Hyperlipidemia, unspecified: Secondary | ICD-10-CM | POA: Diagnosis not present

## 2022-01-14 DIAGNOSIS — E11610 Type 2 diabetes mellitus with diabetic neuropathic arthropathy: Secondary | ICD-10-CM | POA: Diagnosis not present

## 2022-01-14 DIAGNOSIS — Z794 Long term (current) use of insulin: Secondary | ICD-10-CM | POA: Diagnosis not present

## 2022-01-14 DIAGNOSIS — I252 Old myocardial infarction: Secondary | ICD-10-CM | POA: Diagnosis not present

## 2022-01-14 DIAGNOSIS — I10 Essential (primary) hypertension: Secondary | ICD-10-CM | POA: Diagnosis not present

## 2022-01-15 DIAGNOSIS — T8131XA Disruption of external operation (surgical) wound, not elsewhere classified, initial encounter: Secondary | ICD-10-CM | POA: Diagnosis not present

## 2022-01-16 DIAGNOSIS — T8131XA Disruption of external operation (surgical) wound, not elsewhere classified, initial encounter: Secondary | ICD-10-CM | POA: Diagnosis not present

## 2022-01-17 ENCOUNTER — Encounter (HOSPITAL_BASED_OUTPATIENT_CLINIC_OR_DEPARTMENT_OTHER): Payer: BC Managed Care – PPO | Attending: General Surgery | Admitting: General Surgery

## 2022-01-17 DIAGNOSIS — Z951 Presence of aortocoronary bypass graft: Secondary | ICD-10-CM | POA: Insufficient documentation

## 2022-01-17 DIAGNOSIS — M86672 Other chronic osteomyelitis, left ankle and foot: Secondary | ICD-10-CM | POA: Diagnosis not present

## 2022-01-17 DIAGNOSIS — E1151 Type 2 diabetes mellitus with diabetic peripheral angiopathy without gangrene: Secondary | ICD-10-CM | POA: Diagnosis not present

## 2022-01-17 DIAGNOSIS — I10 Essential (primary) hypertension: Secondary | ICD-10-CM | POA: Diagnosis not present

## 2022-01-17 DIAGNOSIS — Z7984 Long term (current) use of oral hypoglycemic drugs: Secondary | ICD-10-CM | POA: Diagnosis not present

## 2022-01-17 DIAGNOSIS — E1140 Type 2 diabetes mellitus with diabetic neuropathy, unspecified: Secondary | ICD-10-CM | POA: Diagnosis not present

## 2022-01-17 DIAGNOSIS — L97324 Non-pressure chronic ulcer of left ankle with necrosis of bone: Secondary | ICD-10-CM | POA: Insufficient documentation

## 2022-01-17 DIAGNOSIS — E11621 Type 2 diabetes mellitus with foot ulcer: Secondary | ICD-10-CM | POA: Insufficient documentation

## 2022-01-17 DIAGNOSIS — E785 Hyperlipidemia, unspecified: Secondary | ICD-10-CM | POA: Diagnosis not present

## 2022-01-17 DIAGNOSIS — I252 Old myocardial infarction: Secondary | ICD-10-CM | POA: Diagnosis not present

## 2022-01-17 DIAGNOSIS — M1990 Unspecified osteoarthritis, unspecified site: Secondary | ICD-10-CM | POA: Diagnosis not present

## 2022-01-17 DIAGNOSIS — Z794 Long term (current) use of insulin: Secondary | ICD-10-CM | POA: Diagnosis not present

## 2022-01-17 DIAGNOSIS — E11622 Type 2 diabetes mellitus with other skin ulcer: Secondary | ICD-10-CM | POA: Diagnosis not present

## 2022-01-17 DIAGNOSIS — E11610 Type 2 diabetes mellitus with diabetic neuropathic arthropathy: Secondary | ICD-10-CM | POA: Insufficient documentation

## 2022-01-17 DIAGNOSIS — K219 Gastro-esophageal reflux disease without esophagitis: Secondary | ICD-10-CM | POA: Diagnosis not present

## 2022-01-17 DIAGNOSIS — Z9181 History of falling: Secondary | ICD-10-CM | POA: Diagnosis not present

## 2022-01-17 DIAGNOSIS — Z7982 Long term (current) use of aspirin: Secondary | ICD-10-CM | POA: Diagnosis not present

## 2022-01-17 DIAGNOSIS — T8131XA Disruption of external operation (surgical) wound, not elsewhere classified, initial encounter: Secondary | ICD-10-CM | POA: Diagnosis not present

## 2022-01-17 DIAGNOSIS — I2510 Atherosclerotic heart disease of native coronary artery without angina pectoris: Secondary | ICD-10-CM | POA: Insufficient documentation

## 2022-01-17 DIAGNOSIS — D649 Anemia, unspecified: Secondary | ICD-10-CM | POA: Diagnosis not present

## 2022-01-17 DIAGNOSIS — L97322 Non-pressure chronic ulcer of left ankle with fat layer exposed: Secondary | ICD-10-CM | POA: Diagnosis not present

## 2022-01-17 LAB — GLUCOSE, CAPILLARY
Glucose-Capillary: 149 mg/dL — ABNORMAL HIGH (ref 70–99)
Glucose-Capillary: 129 mg/dL — ABNORMAL HIGH (ref 70–99)

## 2022-01-17 NOTE — Progress Notes (Signed)
CALEM, COCOZZA ([id number]) Visit Report for 01/17/2022 Problem List Details Patient Name: Date of Service: NA [name] 01/17/2022 10:00 A M Medical Record Number: [id number] Patient Account Number: [id number] Date of Birth/Sex: Treating RN: 23-Aug-1955 (66 y.o. 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 01/17/2022 Medical Record Number: [id number] Patient Account Number: [id number] Date of Birth/Sex: Treating RN: 1955/07/04 (66 y.o. 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 Facility Procedures CPT4 Code: 17616073 Description: G0277-(Facility Use Only) HBOT full body chamber, , ICD-10 Diagnosis Description E11.621 Type 2 diabetes mellitus with foot ulcer 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 M86.672 Other chronic  osteomyelitis, left ankle and foot 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

## 2022-01-17 NOTE — Progress Notes (Addendum)
HUMZA, TALLERICO ([id number]) Visit Report for 01/17/2022 Arrival Information Details Patient Name: Date of Service: NA [name] 01/17/2022 10:00 A M Medical Record Number: [id number] Patient Account Number: [id number] Date of Birth/Sex: Treating RN: 1956-03-18 (66 y.o. [name] Primary Care Ardyce Heyer: [name] Other Clinician: [name] Referring Baelynn Schmuhl: Treating Yamaira Spinner/Extender: [name] in Treatment: 20 Visit Information History Since Last Visit All ordered tests and consults were completed: Yes Patient Arrived: Knee Scooter Added or deleted any medications: No Arrival Time: 08:55 Any new allergies or adverse reactions: No Accompanied By: Wife Had a fall or experienced change in No Transfer Assistance: None activities of daily living that may affect Patient Identification Verified: Yes risk of falls: Secondary Verification Process Completed: Yes Signs or symptoms of abuse/neglect since last visito No Patient Requires Transmission-Based Precautions: No Hospitalized since last visit: No Patient Has Alerts: Yes Implantable device outside of the clinic excluding No Patient Alerts: Patient on Blood Thinner cellular tissue based products placed in the center since last visit: Pain Present Now: No Electronic Signature(s) Signed: 01/17/2022 9:24:24 AM By: [name] EMT Entered By: [name] on 01/17/2022 09:24:24 -------------------------------------------------------------------------------- Encounter Discharge Information Details Patient Name: Date of Service: NA [name] 01/17/2022 10:00 A M Medical Record Number: [id number] Patient Account Number: [id number] Date of Birth/Sex: Treating RN: June 25, 1955 (66 y.o. [name] Primary Care Marcelles Clinard: [name] Other Clinician: [name] Referring Taishaun Levels: Treating Sheng Pritz/Extender: [name]: 20 Encounter  Discharge Information Items Discharge Condition: Stable Ambulatory Status: Knee Scooter Discharge Destination: Home Transportation: Private Auto Accompanied By: Wife Schedule Follow-up Appointment: Yes Clinical Summary of Care: Electronic Signature(s) Signed: 01/17/2022 12:46:28 PM By: [name] EMT Entered By: [name] on 01/17/2022 12:46:28 -------------------------------------------------------------------------------- Vitals Details Patient Name: Date of Service: NA [name] 01/17/2022 10:00 A M Medical Record Number: [id number] Patient Account Number: [id number] Date of Birth/Sex: Treating RN: [birth year] (66 y.o. [name] Primary Care Divon Krabill: [name] Other Clinician: [name] Referring Khaleesi Gruel: Treating Tayonna Bacha/Extender: [name]: 20 Vital Signs Time Taken: 09:08 Temperature (F): 98.6 Height (in): 74 Pulse (bpm): 75 Weight (lbs): 186 Respiratory Rate (breaths/min): 16 Body Mass Index (BMI): 23.9 Blood Pressure (mmHg): 130/74 Capillary Blood Glucose (mg/dl): 106 Reference Range: 80 - 120 mg / dl Electronic Signature(s) Signed: 01/17/2022 9:24:53 AM By: [name] EMT Entered By: [name] on 01/17/2022 09:24:53

## 2022-01-17 NOTE — Progress Notes (Addendum)
KING, PINZON ([id number]) Visit Report for 01/17/2022 HBO Details Patient Name: Date of Service: NA [name] 01/17/2022 10:00 A M Medical Record Number: [id number] Patient Account Number: [id number] Date of Birth/Sex: Treating RN: 1955-11-07 (66 y.o. [name] Primary Care Letha Mirabal: [name] Other Clinician: [name] Referring Cynthia Stainback: Treating Veyda Kaufman/Extender: [name]: 20 HBO Treatment Course Details Treatment Course Number: 1 Ordering Bradyn Soward: [name] T Treatments Ordered: otal 120 HBO Treatment Start Date: 09/20/2021 HBO Indication: Diabetic Ulcer(s) of the Lower Extremity Standard/Conservative Wound Care tried and failed greater than or equal to 30 days HBO Treatment Details Treatment Number: 77 Patient Type: Outpatient Chamber Type: Monoplace Chamber Serial #: [id number] Treatment Protocol: 2.0 ATA with 90 minutes oxygen, with two 5 minute air breaks Treatment Details Compression Rate Down: 2.0 psi / minute De-Compression Rate Up: 2.0 psi / minute A breaks and breathing ir Compress Tx Pressure periods Decompress Decompress Begins Reached (leave unused spaces Begins Ends blank) Chamber Pressure (ATA 1 2 2 2 2 2  --2 1 ) Clock Time (24 hr) 09:11 09:26 09:56 10:01 10:31 10:36 - - 11:06 11:17 Treatment Length: 126 (minutes) Treatment Segments: 4 Vital Signs Capillary Blood Glucose Reference Range: 80 - 120 mg / dl HBO Diabetic Blood Glucose Intervention Range: <131 mg/dl or mg/dl Time Vitals Blood Respiratory Capillary Blood Glucose Pulse Action Type: Pulse: Temperature: Taken: Pressure: Rate: Glucose (mg/dl): Meter #: Oximetry (%) Taken: Pre 09:08 130/74 75 16 98.6 149 Post 11:21 159/91 67 18 97.2 129 Treatment Response Treatment Toleration: Well Treatment Completion Status: Treatment Completed without Adverse Event Additional Procedure Documentation Tissue Sevierity: Necrosis of  bone Physician HBO Attestation: I certify that I supervised this HBO treatment in accordance with Medicare guidelines. A trained emergency response team is readily available per Yes hospital policies and procedures. Continue HBOT as ordered. Yes Electronic Signature(s) Signed: 01/17/2022 4:51:59 PM By: 03/19/2022 MD FACS Previous Signature: 01/17/2022 12:30:49 PM Version By: 03/19/2022 EMT Previous Signature: 01/17/2022 9:29:42 AM Version By: 03/19/2022 EMT Entered By: [name] on 01/17/2022 16:51:59 -------------------------------------------------------------------------------- HBO Safety Checklist Details Patient Name: Date of Service: NA 03/19/2022 A. 01/17/2022 10:00 A M Medical Record Number: 03/19/2022 Patient Account Number: [id number] Date of Birth/Sex: Treating RN: May 09, 1956 (66 y.o. 77 Primary Care Zade Falkner: [name] Other Clinician: [name] Referring Nahima Ales: Treating Gelsey Amyx/Extender: [name]: 20 HBO Safety Checklist Items Safety Checklist Consent Form Signed Patient voided / foley secured and emptied When did you last eato 0645 Last dose of injectable or oral agent 0645 Ostomy pouch emptied and vented if applicable NA All implantable devices assessed, documented and approved NA Intravenous access site secured and place NA Valuables secured Linens and cotton and cotton/polyester blend (less than 51% polyester) Personal oil-based products / skin lotions / body lotions removed Wigs or hairpieces removed NA Smoking or tobacco materials removed Books / newspapers / magazines / loose paper removed Cologne, aftershave, perfume and deodorant removed Jewelry removed (may wrap wedding band) NA Make-up removed NA Hair care products removed Battery operated devices (external) removed Heating patches and chemical warmers removed Titanium eyewear removed NA Nail polish cured  greater than 10 hours NA Casting material cured greater than 10 hours NA Hearing aids removed NA Loose dentures or partials removed NA Prosthetics have been removed NA Patient demonstrates correct use of air break device (if applicable) Patient concerns have been addressed Patient grounding bracelet  on and cord attached to chamber Specifics for Inpatients (complete in addition to above) Medication sheet sent with patient NA Intravenous medications needed or due during therapy sent with patient NA Drainage tubes (e.g. nasogastric tube or chest tube secured and vented) NA Endotracheal or Tracheotomy tube secured NA Cuff deflated of air and inflated with saline NA Airway suctioned NA Notes The safety checklist was done before the treatment was started. Electronic Signature(s) Signed: 01/17/2022 9:26:37 AM By: [name] EMT Entered By: [name] on 01/17/2022 09:26:37

## 2022-01-18 ENCOUNTER — Encounter (HOSPITAL_BASED_OUTPATIENT_CLINIC_OR_DEPARTMENT_OTHER): Payer: BC Managed Care – PPO | Admitting: General Surgery

## 2022-01-18 DIAGNOSIS — L97324 Non-pressure chronic ulcer of left ankle with necrosis of bone: Secondary | ICD-10-CM | POA: Diagnosis not present

## 2022-01-18 DIAGNOSIS — M86672 Other chronic osteomyelitis, left ankle and foot: Secondary | ICD-10-CM | POA: Diagnosis not present

## 2022-01-18 DIAGNOSIS — E1140 Type 2 diabetes mellitus with diabetic neuropathy, unspecified: Secondary | ICD-10-CM | POA: Diagnosis not present

## 2022-01-18 DIAGNOSIS — E11610 Type 2 diabetes mellitus with diabetic neuropathic arthropathy: Secondary | ICD-10-CM | POA: Diagnosis not present

## 2022-01-18 DIAGNOSIS — E1151 Type 2 diabetes mellitus with diabetic peripheral angiopathy without gangrene: Secondary | ICD-10-CM | POA: Diagnosis not present

## 2022-01-18 DIAGNOSIS — I2510 Atherosclerotic heart disease of native coronary artery without angina pectoris: Secondary | ICD-10-CM | POA: Diagnosis not present

## 2022-01-18 DIAGNOSIS — Z951 Presence of aortocoronary bypass graft: Secondary | ICD-10-CM | POA: Diagnosis not present

## 2022-01-18 DIAGNOSIS — E11621 Type 2 diabetes mellitus with foot ulcer: Secondary | ICD-10-CM | POA: Diagnosis not present

## 2022-01-18 DIAGNOSIS — T8131XA Disruption of external operation (surgical) wound, not elsewhere classified, initial encounter: Secondary | ICD-10-CM | POA: Diagnosis not present

## 2022-01-18 LAB — GLUCOSE, CAPILLARY
Glucose-Capillary: 230 mg/dL — ABNORMAL HIGH (ref 70–99)
Glucose-Capillary: 184 mg/dL — ABNORMAL HIGH (ref 70–99)

## 2022-01-18 NOTE — Progress Notes (Addendum)
RYLIN, SEAVEY ([id number]) Visit Report for 01/18/2022 Arrival Information Details Patient Name: Date of Service: NA [name] 01/18/2022 10:00 A M Medical Record Number: [id number] Patient Account Number: [id number] Date of Birth/Sex: Treating RN: 09-02-1955 (66 y.o. [name] Primary Care Cybill Uriegas: [name] Other Clinician: [name] Referring Cozette Braggs: Treating Shiri Hodapp/Extender: [name] in Treatment: 20 Visit Information History Since Last Visit All ordered tests and consults were completed: Yes Patient Arrived: Knee Scooter Added or deleted any medications: No Arrival Time: 08:49 Any new allergies or adverse reactions: No Accompanied By: Wife Had a fall or experienced change in No Transfer Assistance: None activities of daily living that may affect Patient Identification Verified: Yes risk of falls: Secondary Verification Process Completed: Yes Signs or symptoms of abuse/neglect since last visito No Patient Requires Transmission-Based Precautions: No Hospitalized since last visit: No Patient Has Alerts: Yes Implantable device outside of the clinic excluding No Patient Alerts: Patient on Blood Thinner cellular tissue based products placed in the center since last visit: Pain Present Now: No Electronic Signature(s) Signed: 01/18/2022 11:24:16 AM By: [name] EMT Entered By: [name] on 01/18/2022 11:24:16 -------------------------------------------------------------------------------- Encounter Discharge Information Details Patient Name: Date of Service: [name] 01/18/2022 10:00 A M Medical Record Number: [id number] Patient Account Number: [id number] Date of Birth/Sex: Treating RN: 1955/06/24 (67 y.o. [name] Primary Care Jaron Czarnecki: [name] Other Clinician: [name] Referring Josejuan Hoaglin: Treating Favour Aleshire/Extender: [name]: 20 Encounter  Discharge Information Items Discharge Condition: Stable Ambulatory Status: Knee Scooter Discharge Destination: Home Transportation: Private Auto Accompanied By: Wife Schedule Follow-up Appointment: Yes Clinical Summary of Care: Electronic Signature(s) Signed: 01/18/2022 11:30:19 AM By: [name] EMT Entered By: [name] on 01/18/2022 11:30:19 -------------------------------------------------------------------------------- Vitals Details Patient Name: Date of Service: [name] 01/18/2022 10:00 A M Medical Record Number: [id number] Patient Account Number: [id number] Date of Birth/Sex: Treating RN: [birth year] (66 y.o. [name] Primary Care Marquarius Lofton: [name] Other Clinician: [name] Referring Cherisa Brucker: Treating Jaykob Minichiello/Extender: [name]: 20 Vital Signs Time Taken: 09:01 Temperature (F): 98.1 Height (in): 74 Pulse (bpm): 82 Weight (lbs): 186 Respiratory Rate (breaths/min): 16 Body Mass Index (BMI): 23.9 Blood Pressure (mmHg): 137/81 Capillary Blood Glucose (mg/dl): 631 Reference Range: 80 - 120 mg / dl Electronic Signature(s) Signed: 01/18/2022 11:25:06 AM By: [name] EMT Entered By: [name] on 01/18/2022 11:25:05

## 2022-01-18 NOTE — Progress Notes (Signed)
NOLLAN, MULDROW ([id number]) Visit Report for 01/18/2022 Problem List Details Patient Name: Date of Service: NA [name] 01/18/2022 10:00 A M Medical Record Number: [id number] Patient Account Number: [id number] Date of Birth/Sex: Treating RN: November 15, 1955 (66 y.o. 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 01/18/2022 Medical Record Number: [id number] Patient Account Number: [id number] Date of Birth/Sex: Treating RN: 1955/07/06 (66 y.o. 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 Facility Procedures CPT4 Code: 81017510 Description: G0277-(Facility Use Only) HBOT full body chamber, , ICD-10 Diagnosis Description E11.621 Type 2 diabetes mellitus with foot ulcer 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 M86.672 Other chronic  osteomyelitis, left ankle and foot 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

## 2022-01-18 NOTE — Progress Notes (Addendum)
EQUAN, COGBILL ([id number]) Visit Report for 01/18/2022 HBO Details Patient Name: Date of Service: NA [name] 01/18/2022 10:00 A M Medical Record Number: [id number] Patient Account Number: [id number] Date of Birth/Sex: Treating RN: [birth year] (66 y.o. [name] Primary Care [name]: [name] Other Clinician: [name] Referring [name]: Treating [name]/Extender: [name] Weeks in Treatment: 20 HBO Treatment Course Details Treatment Course Number: 1 Ordering [name]: [name] T Treatments Ordered: otal 120 HBO Treatment Start Date: 09/20/2021 HBO Indication: Diabetic Ulcer(s) of the Lower Extremity Standard/Conservative Wound Care tried and failed greater than or equal to 30 days HBO Treatment Details Treatment Number: 78 Patient Type: Outpatient Chamber Type: Monoplace Chamber Serial #: [id number] Treatment Protocol: 2.0 ATA with 90 minutes oxygen, with two 5 minute air breaks Treatment Details Compression Rate Down: 2.0 psi / minute De-Compression Rate Up: 2.0 psi / minute A breaks and breathing ir Compress Tx Pressure periods Decompress Decompress Begins Reached (leave unused spaces Begins Ends blank) Chamber Pressure (ATA 1 2 2 2 2 2  --2 1 ) Clock Time (24 hr) 09:08 09:20 09:50 09:55 10:26 10:31 - - 11:01 11:11 Treatment Length: 123 (minutes) Treatment Segments: 4 Vital Signs Capillary Blood Glucose Reference Range: 80 - 120 mg / dl HBO Diabetic Blood Glucose Intervention Range: <131 mg/dl or mg/dl Time Vitals Blood Respiratory Capillary Blood Glucose Pulse Action Type: Pulse: Temperature: Taken: Pressure: Rate: Glucose (mg/dl): Meter #: Oximetry (%) Taken: Pre 09:01 137/81 82 16 98.1 230 Post 11:14 128/87 72 16 97.9 184 Treatment Response Treatment Toleration: Well Treatment Completion Status: Treatment Completed without Adverse Event Additional Procedure Documentation Tissue Sevierity: Necrosis of  bone Physician HBO Attestation: I certify that I supervised this HBO treatment in accordance with Medicare guidelines. A trained emergency response team is readily available per Yes hospital policies and procedures. Continue HBOT as ordered. Yes Electronic Signature(s) Signed: 01/18/2022 12:29:38 PM By: 03/20/2022 MD FACS Previous Signature: 01/18/2022 11:28:26 AM Version By: 03/20/2022 EMT Entered By: [name] on 01/18/2022 12:29:38 -------------------------------------------------------------------------------- HBO Safety Checklist Details Patient Name: Date of Service: NA 03/20/2022 A. 01/18/2022 10:00 A M Medical Record Number: 03/20/2022 Patient Account Number: [id number] Date of Birth/Sex: Treating RN: [birth year] (66 y.o. 77 Primary Care [name]: [name] Other Clinician: [name] Referring Ivon [name]: Treating [name]/Extender: [name] Weeks in Treatment: 20 HBO Safety Checklist Items Safety Checklist Consent Form Signed Patient voided / foley secured and emptied When did you last eato 0700 Last dose of injectable or oral agent 0700 Ostomy pouch emptied and vented if applicable NA All implantable devices assessed, documented and approved NA Intravenous access site secured and place NA Valuables secured Linens and cotton and cotton/polyester blend (less than 51% polyester) Personal oil-based products / skin lotions / body lotions removed Wigs or hairpieces removed NA Smoking or tobacco materials removed Books / newspapers / magazines / loose paper removed Cologne, aftershave, perfume and deodorant removed Jewelry removed (may wrap wedding band) NA Make-up removed NA Hair care products removed Battery operated devices (external) removed Heating patches and chemical warmers removed Titanium eyewear removed NA Nail polish cured greater than 10 hours NA Casting material cured greater than 10  hours NA Hearing aids removed NA Loose dentures or partials removed NA Prosthetics have been removed NA Patient demonstrates correct use of air break device (if applicable) Patient concerns have been addressed Patient grounding bracelet on and cord attached to chamber Specifics for Inpatients (complete  in addition to above) Medication sheet sent with patient NA Intravenous medications needed or due during therapy sent with patient NA Drainage tubes (e.g. nasogastric tube or chest tube secured and vented) NA Endotracheal or Tracheotomy tube secured NA Cuff deflated of air and inflated with saline NA Airway suctioned NA Notes The safety checklist was done before the treatment was started. Electronic Signature(s) Signed: 01/18/2022 11:26:21 AM By: [name] EMT Entered By: [name] on 01/18/2022 11:26:21

## 2022-01-19 ENCOUNTER — Encounter (HOSPITAL_BASED_OUTPATIENT_CLINIC_OR_DEPARTMENT_OTHER): Payer: BC Managed Care – PPO | Admitting: General Surgery

## 2022-01-19 DIAGNOSIS — L97324 Non-pressure chronic ulcer of left ankle with necrosis of bone: Secondary | ICD-10-CM | POA: Diagnosis not present

## 2022-01-19 DIAGNOSIS — L97322 Non-pressure chronic ulcer of left ankle with fat layer exposed: Secondary | ICD-10-CM | POA: Diagnosis not present

## 2022-01-19 DIAGNOSIS — E785 Hyperlipidemia, unspecified: Secondary | ICD-10-CM | POA: Diagnosis not present

## 2022-01-19 DIAGNOSIS — M86672 Other chronic osteomyelitis, left ankle and foot: Secondary | ICD-10-CM | POA: Diagnosis not present

## 2022-01-19 DIAGNOSIS — E1140 Type 2 diabetes mellitus with diabetic neuropathy, unspecified: Secondary | ICD-10-CM | POA: Diagnosis not present

## 2022-01-19 DIAGNOSIS — E11621 Type 2 diabetes mellitus with foot ulcer: Secondary | ICD-10-CM | POA: Diagnosis not present

## 2022-01-19 DIAGNOSIS — Z951 Presence of aortocoronary bypass graft: Secondary | ICD-10-CM | POA: Diagnosis not present

## 2022-01-19 DIAGNOSIS — I10 Essential (primary) hypertension: Secondary | ICD-10-CM | POA: Diagnosis not present

## 2022-01-19 DIAGNOSIS — D649 Anemia, unspecified: Secondary | ICD-10-CM | POA: Diagnosis not present

## 2022-01-19 DIAGNOSIS — K219 Gastro-esophageal reflux disease without esophagitis: Secondary | ICD-10-CM | POA: Diagnosis not present

## 2022-01-19 DIAGNOSIS — I252 Old myocardial infarction: Secondary | ICD-10-CM | POA: Diagnosis not present

## 2022-01-19 DIAGNOSIS — E1151 Type 2 diabetes mellitus with diabetic peripheral angiopathy without gangrene: Secondary | ICD-10-CM | POA: Diagnosis not present

## 2022-01-19 DIAGNOSIS — I2510 Atherosclerotic heart disease of native coronary artery without angina pectoris: Secondary | ICD-10-CM | POA: Diagnosis not present

## 2022-01-19 DIAGNOSIS — M1990 Unspecified osteoarthritis, unspecified site: Secondary | ICD-10-CM | POA: Diagnosis not present

## 2022-01-19 DIAGNOSIS — Z7984 Long term (current) use of oral hypoglycemic drugs: Secondary | ICD-10-CM | POA: Diagnosis not present

## 2022-01-19 DIAGNOSIS — Z9181 History of falling: Secondary | ICD-10-CM | POA: Diagnosis not present

## 2022-01-19 DIAGNOSIS — E11610 Type 2 diabetes mellitus with diabetic neuropathic arthropathy: Secondary | ICD-10-CM | POA: Diagnosis not present

## 2022-01-19 DIAGNOSIS — Z794 Long term (current) use of insulin: Secondary | ICD-10-CM | POA: Diagnosis not present

## 2022-01-19 DIAGNOSIS — E11622 Type 2 diabetes mellitus with other skin ulcer: Secondary | ICD-10-CM | POA: Diagnosis not present

## 2022-01-19 DIAGNOSIS — T8131XA Disruption of external operation (surgical) wound, not elsewhere classified, initial encounter: Secondary | ICD-10-CM | POA: Diagnosis not present

## 2022-01-19 DIAGNOSIS — Z7982 Long term (current) use of aspirin: Secondary | ICD-10-CM | POA: Diagnosis not present

## 2022-01-19 LAB — GLUCOSE, CAPILLARY
Glucose-Capillary: 186 mg/dL — ABNORMAL HIGH (ref 70–99)
Glucose-Capillary: 180 mg/dL — ABNORMAL HIGH (ref 70–99)

## 2022-01-19 NOTE — Progress Notes (Addendum)
DECKER, COGDELL ([id number]) Visit Report for 01/19/2022 Arrival Information Details Patient Name: Date of Service: NA [name] 01/19/2022 10:00 A M Medical Record Number: [id number] Patient Account Number: [id number] Date of Birth/Sex: Treating RN: 25-Apr-1956 (66 y.o. [name], [name] Primary Care Makinsey Pepitone: [name] Other Clinician: [name] Referring Alegandra Sommers: Treating Aero Drummonds/Extender: [name] in Treatment: 21 Visit Information History Since Last Visit All ordered tests and consults were completed: Yes Patient Arrived: Knee Scooter Added or deleted any medications: No Arrival Time: 09:00 Any new allergies or adverse reactions: No Accompanied By: Wife Had a fall or experienced change in No Transfer Assistance: None activities of daily living that may affect Patient Identification Verified: Yes risk of falls: Secondary Verification Process Completed: Yes Signs or symptoms of abuse/neglect since last visito No Patient Requires Transmission-Based Precautions: No Hospitalized since last visit: No Patient Has Alerts: Yes Implantable device outside of the clinic excluding No Patient Alerts: Patient on Blood Thinner cellular tissue based products placed in the center since last visit: Pain Present Now: No Electronic Signature(s) Signed: 01/19/2022 11:20:42 AM By: [name] EMT Entered By: [name] on 01/19/2022 11:20:42 -------------------------------------------------------------------------------- Encounter Discharge Information Details Patient Name: Date of Service: [name] 01/19/2022 10:00 A M Medical Record Number: [id number] Patient Account Number: [id number] Date of Birth/Sex: Treating RN: Nov 05, 1955 (66 y.o. [name] Primary Care Gwynne Kemnitz: [name] Other Clinician: [name] Referring Luiz Trumpower: Treating Renner Sebald/Extender: [name] in Treatment: 21 Encounter Discharge  Information Items Discharge Condition: Stable Ambulatory Status: Knee Scooter Discharge Destination: Home Transportation: Private Auto Accompanied By: None Schedule Follow-up Appointment: Yes Clinical Summary of Care: Electronic Signature(s) Signed: 01/19/2022 11:44:19 AM By: [name] EMT Entered By: [name] on 01/19/2022 11:44:19 -------------------------------------------------------------------------------- Vitals Details Patient Name: Date of Service: [name] 01/19/2022 10:00 A M Medical Record Number: [id number] Patient Account Number: [id number] Date of Birth/Sex: Treating RN: 07/01/1955 (66 y.o. [name], [name] Primary Care Jaylinn Hellenbrand: [name] Other Clinician: [name] Referring Linkon Siverson: Treating Myrtis Maille/Extender: [name]: 21 Vital Signs Time Taken: 09:17 Temperature (F): 98.1 Height (in): 74 Pulse (bpm): 87 Weight (lbs): 186 Respiratory Rate (breaths/min): 16 Body Mass Index (BMI): 23.9 Blood Pressure (mmHg): 131/94 Capillary Blood Glucose (mg/dl): 264 Reference Range: 80 - 120 mg / dl Electronic Signature(s) Signed: 01/19/2022 11:21:10 AM By: [name] EMT Entered By: [name] on 01/19/2022 11:21:09

## 2022-01-19 NOTE — Progress Notes (Addendum)
GAMBLE, ENDERLE ([id number]) Visit Report for 01/19/2022 HBO Details Patient Name: Date of Service: NA [name] 01/19/2022 10:00 A M Medical Record Number: [id number] Patient Account Number: [id number] Date of Birth/Sex: Treating RN: 16-Jul-1955 (66 y.o. [name] Primary Care [name]: [name] Other Clinician: [name] Referring [name]: Treating [name]: [name]: 21 HBO [name] Course Details [name] Course Number: 1 Ordering [name]: [name] T Treatments Ordered: otal 120 HBO [name] Start Date: 09/20/2021 HBO Indication: Diabetic Ulcer(s) of the Lower Extremity Standard/Conservative Wound Care tried and failed greater than or equal to 30 days HBO [name] Details [name] Number: [id number] Patient Type: Outpatient Chamber Type: Monoplace Chamber Serial #: [id number] [name] Protocol: 2.0 ATA with 90 minutes oxygen, with two 5 minute air breaks [name] Details Compression Rate Down: 2.0 psi / minute De-Compression Rate Up: 2.0 psi / minute A breaks and breathing ir Compress Tx Pressure periods Decompress Decompress Begins Reached (leave unused spaces Begins Ends blank) Chamber Pressure (ATA 1 2 2 2 2 2  --2 1 ) Clock Time (24 hr) 09:23 09:37 10:07 10:12 10:42 10:47 - - 11:17 11:29 [name] Length: 126 (minutes) [name] Segments: 4 Vital Signs Capillary Blood Glucose Reference Range: 80 - 120 mg / dl HBO Diabetic Blood Glucose Intervention Range: <131 mg/dl or mg/dl Time Vitals Blood Respiratory Capillary Blood Glucose Pulse Action Type: Pulse: Temperature: Taken: Pressure: Rate: Glucose (mg/dl): Meter #: Oximetry (%) Taken: Pre 09:17 131/94 87 16 98.1 186 Post 11:33 139/92 79 16 97.4 180 [name] Response [name] Toleration: Well [name] Completion Status: [name] Completed without Adverse Event Additional Procedure Documentation Tissue Sevierity: Necrosis of  bone Physician HBO Attestation: I certify that I supervised this HBO [name] in accordance with Medicare guidelines. A trained emergency response team is readily available per Yes hospital policies and procedures. Continue HBOT as ordered. Yes Electronic Signature(s) Signed: 01/19/2022 3:55:28 PM By: 03/21/2022 MD FACS Previous Signature: 01/19/2022 11:42:07 AM Version By: 03/21/2022 EMT Entered By: [name] on 01/19/2022 15:55:27 -------------------------------------------------------------------------------- HBO Safety Checklist Details Patient Name: Date of Service: 03/21/2022 A. 01/19/2022 10:00 A M Medical Record Number: 03/21/2022 Patient Account Number: [id number] Date of Birth/Sex: Treating RN: 1955/07/06 (66 y.o. 79, [name] Primary Care [name]: [name] Other Clinician: [name] Referring [name]: Treating [name]: [name] Weeks in [name]: 21 HBO Safety Checklist Items Safety Checklist Consent Form Signed Patient voided / foley secured and emptied When did you last eato 0645 Last dose of injectable or oral agent 0645 Ostomy pouch emptied and vented if applicable NA All implantable devices assessed, documented and approved NA Intravenous access site secured and place NA Valuables secured Linens and cotton and cotton/polyester blend (less than 51% polyester) Personal oil-based products / skin lotions / body lotions removed Wigs or hairpieces removed NA Smoking or tobacco materials removed Books / newspapers / magazines / loose paper removed Cologne, aftershave, perfume and deodorant removed Jewelry removed (may wrap wedding band) NA Make-up removed NA Hair care products removed Battery operated devices (external) removed Heating patches and chemical warmers removed Titanium eyewear removed NA Nail polish cured greater than 10 hours NA Casting material cured greater than 10  hours NA Hearing aids removed NA Loose dentures or partials removed NA Prosthetics have been removed NA Patient demonstrates correct use of air break device (if applicable) Patient concerns have been addressed Patient grounding bracelet on and cord attached to chamber Specifics for Inpatients (complete  in addition to above) Medication sheet sent with patient NA Intravenous medications needed or due during therapy sent with patient NA Drainage tubes (e.g. nasogastric tube or chest tube secured and vented) NA Endotracheal or Tracheotomy tube secured NA Cuff deflated of air and inflated with saline NA Airway suctioned NA Notes The safety checklist was done before the [name] was started. Electronic Signature(s) Signed: 01/19/2022 11:23:05 AM By: [name] EMT Entered By: [name] on 01/19/2022 11:23:05

## 2022-01-19 NOTE — Progress Notes (Signed)
[name], [name] ([id number]) Visit Report for 01/19/2022 Problem List Details Patient Name: Date of Service: NA [name] 01/19/2022 10:00 A M Medical Record Number: [id number] Patient Account Number: [id number] Date of Birth/Sex: Treating RN: 03-07-1956 (66 y.o. 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 01/19/2022 Medical Record Number: [id number] Patient Account Number: [id number] Date of Birth/Sex: Treating RN: [birth year] (66 y.o. 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 Facility Procedures CPT4 Code: 32992426 Description: G0277-(Facility Use Only) HBOT full body chamber, , ICD-10 Diagnosis Description E11.621 Type 2 diabetes mellitus with foot ulcer 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 M86.672 Other chronic  osteomyelitis, left ankle and foot 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

## 2022-01-20 ENCOUNTER — Encounter (HOSPITAL_BASED_OUTPATIENT_CLINIC_OR_DEPARTMENT_OTHER): Payer: BC Managed Care – PPO | Admitting: General Surgery

## 2022-01-20 DIAGNOSIS — M86672 Other chronic osteomyelitis, left ankle and foot: Secondary | ICD-10-CM | POA: Diagnosis not present

## 2022-01-20 DIAGNOSIS — I2510 Atherosclerotic heart disease of native coronary artery without angina pectoris: Secondary | ICD-10-CM | POA: Diagnosis not present

## 2022-01-20 DIAGNOSIS — E11610 Type 2 diabetes mellitus with diabetic neuropathic arthropathy: Secondary | ICD-10-CM | POA: Diagnosis not present

## 2022-01-20 DIAGNOSIS — T8131XA Disruption of external operation (surgical) wound, not elsewhere classified, initial encounter: Secondary | ICD-10-CM | POA: Diagnosis not present

## 2022-01-20 DIAGNOSIS — E1151 Type 2 diabetes mellitus with diabetic peripheral angiopathy without gangrene: Secondary | ICD-10-CM | POA: Diagnosis not present

## 2022-01-20 DIAGNOSIS — L97324 Non-pressure chronic ulcer of left ankle with necrosis of bone: Secondary | ICD-10-CM | POA: Diagnosis not present

## 2022-01-20 DIAGNOSIS — E1140 Type 2 diabetes mellitus with diabetic neuropathy, unspecified: Secondary | ICD-10-CM | POA: Diagnosis not present

## 2022-01-20 DIAGNOSIS — E11621 Type 2 diabetes mellitus with foot ulcer: Secondary | ICD-10-CM | POA: Diagnosis not present

## 2022-01-20 DIAGNOSIS — Z951 Presence of aortocoronary bypass graft: Secondary | ICD-10-CM | POA: Diagnosis not present

## 2022-01-20 LAB — GLUCOSE, CAPILLARY
Glucose-Capillary: 127 mg/dL — ABNORMAL HIGH (ref 70–99)
Glucose-Capillary: 186 mg/dL — ABNORMAL HIGH (ref 70–99)

## 2022-01-20 NOTE — Progress Notes (Signed)
MUHAMMED, TEUTSCH ([id number]) Visit Report for 01/20/2022 Problem List Details Patient Name: Date of Service: NA [name] 01/20/2022 10:00 A M Medical Record Number: [id number] Patient Account Number: [id number] Date of Birth/Sex: Treating RN: [birth year] (66 y.o. 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 01/20/2022 Medical Record Number: [id number] Patient Account Number: [id number] Date of Birth/Sex: Treating RN: 01-12-1956 (66 y.o. 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 Facility Procedures CPT4 Code: 07371062 Description: G0277-(Facility Use Only) HBOT full body chamber, , ICD-10 Diagnosis Description E11.621 Type 2 diabetes mellitus with foot ulcer 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 M86.672 Other chronic  osteomyelitis, left ankle and foot 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

## 2022-01-20 NOTE — Progress Notes (Addendum)
JISHNU, JENNIGES ([id number]) Visit Report for 01/20/2022 Arrival Information Details Patient Name: Date of Service: NA [name] 01/20/2022 10:00 A M Medical Record Number: [id number] Patient Account Number: [id number] Date of Birth/Sex: Treating RN: [birth year] (66 y.o. [name] Primary Care Jarell Mcewen: [name] Other Clinician: [name] Referring Radonna Bracher: Treating Casia Corti/Extender: [name] in Treatment: 21 Visit Information History Since Last Visit All ordered tests and consults were completed: Yes Patient Arrived: Knee Scooter Added or deleted any medications: No Arrival Time: 08:53 Any new allergies or adverse reactions: No Accompanied By: Wife Had a fall or experienced change in No Transfer Assistance: None activities of daily living that may affect Patient Identification Verified: Yes risk of falls: Secondary Verification Process Completed: Yes Signs or symptoms of abuse/neglect since last visito No Patient Requires Transmission-Based Precautions: No Hospitalized since last visit: No Patient Has Alerts: Yes Implantable device outside of the clinic excluding No Patient Alerts: Patient on Blood Thinner cellular tissue based products placed in the center since last visit: Pain Present Now: No Electronic Signature(s) Signed: 01/20/2022 11:13:13 AM By: [name] EMT Entered By: [name] on 01/20/2022 11:13:12 -------------------------------------------------------------------------------- Encounter Discharge Information Details Patient Name: Date of Service: NA [name] 01/20/2022 10:00 A M Medical Record Number: [id number] Patient Account Number: [id number] Date of Birth/Sex: Treating RN: 02-23-1956 (66 y.o. [name] Primary Care Parminder Cupples: [name] Other Clinician: [name] Referring Esiah Bazinet: Treating Makela Niehoff/Extender: [name] in Treatment: 21 Encounter  Discharge Information Items Discharge Condition: Stable Ambulatory Status: Knee Scooter Discharge Destination: Home Transportation: Private Auto Accompanied By: Wife Schedule Follow-up Appointment: Yes Clinical Summary of Care: Electronic Signature(s) Signed: 01/20/2022 11:45:58 AM By: [name] EMT Entered By: [name] on 01/20/2022 11:45:58 -------------------------------------------------------------------------------- Vitals Details Patient Name: Date of Service: NA [name] 01/20/2022 10:00 A M Medical Record Number: [id number] Patient Account Number: [id number] Date of Birth/Sex: Treating RN: 1956-02-27 (66 y.o. [name] Primary Care Shelonda Saxe: [name] Other Clinician: [name] Referring Bonetta Mostek: Treating Recia Sons/Extender: [name]: 21 Vital Signs Time Taken: 09:08 Temperature (F): 98.1 Height (in): 74 Pulse (bpm): 86 Weight (lbs): 186 Respiratory Rate (breaths/min): 16 Body Mass Index (BMI): 23.9 Blood Pressure (mmHg): 148/89 Capillary Blood Glucose (mg/dl): 073 Reference Range: 80 - 120 mg / dl Electronic Signature(s) Signed: 01/20/2022 11:13:54 AM By: [name] EMT Entered By: [name] on 01/20/2022 11:13:54

## 2022-01-20 NOTE — Progress Notes (Addendum)
CHE, RACHAL ([id number]) Visit Report for 01/20/2022 HBO Details Patient Name: Date of Service: NA [name] 01/20/2022 10:00 A M Medical Record Number: [id number] Patient Account Number: [id number] Date of Birth/Sex: Treating RN: [birth year] (66 y.o. [name] Primary Care Irene Collings: [name] Other Clinician: [name] Referring Demetrick Eichenberger: Treating Anjulie Dipierro/Extender: [name]: 21 HBO Treatment Course Details Treatment Course Number: 1 Ordering Jahki Witham: [name] T Treatments Ordered: otal 120 HBO Treatment Start Date: 09/20/2021 HBO Indication: Diabetic Ulcer(s) of the Lower Extremity Standard/Conservative Wound Care tried and failed greater than or equal to 30 days HBO Treatment Details Treatment Number: 80 Patient Type: Outpatient Chamber Type: Monoplace Chamber Serial #: [id number] Treatment Protocol: 2.0 ATA with 90 minutes oxygen, with two 5 minute air breaks Treatment Details Compression Rate Down: 2.0 psi / minute De-Compression Rate Up: 2.0 psi / minute A breaks and breathing ir Compress Tx Pressure periods Decompress Decompress Begins Reached (leave unused spaces Begins Ends blank) Chamber Pressure (ATA 1 2 2 2 2 2  --2 1 ) Clock Time (24 hr) 09:24 09:36 10:06 10:11 10:41 10:46 - - 11:16 11:27 Treatment Length: 123 (minutes) Treatment Segments: 4 Vital Signs Capillary Blood Glucose Reference Range: 80 - 120 mg / dl HBO Diabetic Blood Glucose Intervention Range: <131 mg/dl or mg/dl Type: Time Vitals Blood Pulse: Respiratory Capillary Blood Glucose Pulse Action Temperature: Taken: Pressure: Rate: Glucose (mg/dl): Meter #: Oximetry (%) Taken: Pre 09:08 148/89 86 16 98.1 127 Patient given 8 oz glucerna Post 11:32 167/89 75 16 97.9 186 Treatment Response Treatment Toleration: Well Treatment Completion Status: Treatment Completed without Adverse Event Treatment Notes Dr. >568 informed of  the patient blood sugar, and that the patient was given 8 oz of glucerna. She stated that we could go ahead and start treatment. after the patient had the glucerna. Additional Procedure Documentation Tissue Sevierity: Necrosis of bone Physician HBO Attestation: I certify that I supervised this HBO treatment in accordance with Medicare guidelines. A trained emergency response team is readily available per Yes hospital policies and procedures. Continue HBOT as ordered. Yes Electronic Signature(s) Signed: 01/20/2022 12:24:30 PM By: 03/22/2022 MD FACS Previous Signature: 01/20/2022 11:43:59 AM Version By: 03/22/2022 EMT Entered By: [name] on 01/20/2022 12:24:30 -------------------------------------------------------------------------------- HBO Safety Checklist Details Patient Name: Date of Service: 03/22/2022 A. 01/20/2022 10:00 A M Medical Record Number: 03/22/2022 Patient Account Number: [id number] Date of Birth/Sex: Treating RN: 1956-01-23 (66 y.o. 77 Primary Care Mckynzie Liwanag: [name] Other Clinician: [name] Referring Andreika Vandagriff: Treating Kenia Teagarden/Extender: [name]: 21 HBO Safety Checklist Items Safety Checklist Consent Form Signed Patient voided / foley secured and emptied When did you last eato 0700 Last dose of injectable or oral agent 0700 Ostomy pouch emptied and vented if applicable NA All implantable devices assessed, documented and approved NA Intravenous access site secured and place NA Valuables secured Linens and cotton and cotton/polyester blend (less than 51% polyester) Personal oil-based products / skin lotions / body lotions removed Wigs or hairpieces removed NA Smoking or tobacco materials removed Books / newspapers / magazines / loose paper removed Cologne, aftershave, perfume and deodorant removed Jewelry removed (may wrap wedding band) NA Make-up removed NA Hair  care products removed Battery operated devices (external) removed Heating patches and chemical warmers removed Titanium eyewear removed NA Nail polish cured greater than 10 hours NA Casting material cured greater than 10 hours NA Hearing aids  removed NA Loose dentures or partials removed NA Prosthetics have been removed NA Patient demonstrates correct use of air break device (if applicable) Patient concerns have been addressed Patient grounding bracelet on and cord attached to chamber Specifics for Inpatients (complete in addition to above) Medication sheet sent with patient NA Intravenous medications needed or due during therapy sent with patient NA Drainage tubes (e.g. nasogastric tube or chest tube secured and vented) NA Endotracheal or Tracheotomy tube secured NA Cuff deflated of air and inflated with saline NA Airway suctioned NA Notes The safety checklist was done before the treatment was started. Electronic Signature(s) Signed: 01/20/2022 11:23:57 AM By: [name] EMT Entered By: [name] on 01/20/2022 11:23:57

## 2022-01-21 ENCOUNTER — Encounter (HOSPITAL_BASED_OUTPATIENT_CLINIC_OR_DEPARTMENT_OTHER): Payer: BC Managed Care – PPO | Admitting: General Surgery

## 2022-01-21 DIAGNOSIS — Z794 Long term (current) use of insulin: Secondary | ICD-10-CM | POA: Diagnosis not present

## 2022-01-21 DIAGNOSIS — E11621 Type 2 diabetes mellitus with foot ulcer: Secondary | ICD-10-CM | POA: Diagnosis not present

## 2022-01-21 DIAGNOSIS — Z7982 Long term (current) use of aspirin: Secondary | ICD-10-CM | POA: Diagnosis not present

## 2022-01-21 DIAGNOSIS — E1151 Type 2 diabetes mellitus with diabetic peripheral angiopathy without gangrene: Secondary | ICD-10-CM | POA: Diagnosis not present

## 2022-01-21 DIAGNOSIS — I10 Essential (primary) hypertension: Secondary | ICD-10-CM | POA: Diagnosis not present

## 2022-01-21 DIAGNOSIS — K219 Gastro-esophageal reflux disease without esophagitis: Secondary | ICD-10-CM | POA: Diagnosis not present

## 2022-01-21 DIAGNOSIS — I2510 Atherosclerotic heart disease of native coronary artery without angina pectoris: Secondary | ICD-10-CM | POA: Diagnosis not present

## 2022-01-21 DIAGNOSIS — D649 Anemia, unspecified: Secondary | ICD-10-CM | POA: Diagnosis not present

## 2022-01-21 DIAGNOSIS — L97322 Non-pressure chronic ulcer of left ankle with fat layer exposed: Secondary | ICD-10-CM | POA: Diagnosis not present

## 2022-01-21 DIAGNOSIS — M1990 Unspecified osteoarthritis, unspecified site: Secondary | ICD-10-CM | POA: Diagnosis not present

## 2022-01-21 DIAGNOSIS — L97324 Non-pressure chronic ulcer of left ankle with necrosis of bone: Secondary | ICD-10-CM | POA: Diagnosis not present

## 2022-01-21 DIAGNOSIS — E11610 Type 2 diabetes mellitus with diabetic neuropathic arthropathy: Secondary | ICD-10-CM | POA: Diagnosis not present

## 2022-01-21 DIAGNOSIS — Z7984 Long term (current) use of oral hypoglycemic drugs: Secondary | ICD-10-CM | POA: Diagnosis not present

## 2022-01-21 DIAGNOSIS — T8131XA Disruption of external operation (surgical) wound, not elsewhere classified, initial encounter: Secondary | ICD-10-CM | POA: Diagnosis not present

## 2022-01-21 DIAGNOSIS — Z9181 History of falling: Secondary | ICD-10-CM | POA: Diagnosis not present

## 2022-01-21 DIAGNOSIS — I252 Old myocardial infarction: Secondary | ICD-10-CM | POA: Diagnosis not present

## 2022-01-21 DIAGNOSIS — E785 Hyperlipidemia, unspecified: Secondary | ICD-10-CM | POA: Diagnosis not present

## 2022-01-21 DIAGNOSIS — E11622 Type 2 diabetes mellitus with other skin ulcer: Secondary | ICD-10-CM | POA: Diagnosis not present

## 2022-01-21 DIAGNOSIS — Z951 Presence of aortocoronary bypass graft: Secondary | ICD-10-CM | POA: Diagnosis not present

## 2022-01-21 DIAGNOSIS — E1140 Type 2 diabetes mellitus with diabetic neuropathy, unspecified: Secondary | ICD-10-CM | POA: Diagnosis not present

## 2022-01-21 DIAGNOSIS — M86672 Other chronic osteomyelitis, left ankle and foot: Secondary | ICD-10-CM | POA: Diagnosis not present

## 2022-01-21 LAB — GLUCOSE, CAPILLARY
Glucose-Capillary: 195 mg/dL — ABNORMAL HIGH (ref 70–99)
Glucose-Capillary: 185 mg/dL — ABNORMAL HIGH (ref 70–99)

## 2022-01-21 NOTE — Progress Notes (Addendum)
ARIAN, MURLEY ([id number]) Visit Report for 01/21/2022 Arrival Information Details Patient Name: Date of Service: NA [name] 01/21/2022 10:00 A M Medical Record Number: [id number] Patient Account Number: [id number] Date of Birth/Sex: Treating RN: 09/25/1955 (66 y.o. [name] Primary Care Jorell Agne: [name] Other Clinician: [name] Referring Matayah Reyburn: Treating Emmalou Hunger/Extender: [name] in Treatment: 21 Visit Information History Since Last Visit All ordered tests and consults were completed: Yes Patient Arrived: Knee Scooter Added or deleted any medications: No Arrival Time: 08:43 Any new allergies or adverse reactions: No Accompanied By: None Had a fall or experienced change in No Transfer Assistance: None activities of daily living that may affect Patient Identification Verified: Yes risk of falls: Secondary Verification Process Completed: Yes Signs or symptoms of abuse/neglect since last visito No Patient Requires Transmission-Based Precautions: No Hospitalized since last visit: No Patient Has Alerts: Yes Implantable device outside of the clinic excluding No Patient Alerts: Patient on Blood Thinner cellular tissue based products placed in the center since last visit: Pain Present Now: No Electronic Signature(s) Signed: 01/21/2022 9:08:16 AM By: [name] EMT Entered By: [name] on 01/21/2022 09:08:16 -------------------------------------------------------------------------------- Encounter Discharge Information Details Patient Name: Date of Service: NA Colin [name] 01/21/2022 10:00 A M Medical Record Number: [id number] Patient Account Number: [id number] Date of Birth/Sex: Treating RN: [birth year] (66 y.o. [name] Primary Care Darlena Koval: [name] Other Clinician: [name] Referring Margarite Vessel: Treating Manolito Jurewicz/Extender: [name] in Treatment:  21 Encounter Discharge Information Items Discharge Condition: Stable Ambulatory Status: Ambulatory Discharge Destination: Other (Note Required) Transportation: Private Auto Accompanied By: self Schedule Follow-up Appointment: No Clinical Summary of Care: Electronic Signature(s) Signed: 01/21/2022 12:53:53 PM By: [name] CHT EMT BS , , Entered By: [name] on 01/21/2022 12:53:52 -------------------------------------------------------------------------------- Vitals Details Patient Name: Date of Service: NA Colin Nephew A. 01/21/2022 10:00 A M Medical Record Number: [id number] Patient Account Number: [id number] Date of Birth/Sex: Treating RN: 1955/11/14 (66 y.o. [name] Primary Care Armari Fussell: [name] Other Clinician: [name] Referring Michaline Kindig: Treating Deontrey Massi/Extender: [name]: 21 Vital Signs Time Taken: 08:55 Temperature (F): 97.4 Height (in): 74 Pulse (bpm): 86 Weight (lbs): 186 Respiratory Rate (breaths/min): 16 Body Mass Index (BMI): 23.9 Blood Pressure (mmHg): 136/75 Capillary Blood Glucose (mg/dl): 812 Reference Range: 80 - 120 mg / dl Electronic Signature(s) Signed: 01/21/2022 9:08:40 AM By: [name] EMT Entered By: [name] on 01/21/2022 09:08:40

## 2022-01-21 NOTE — Progress Notes (Addendum)
[name], [name] ([id number]) Visit Report for 01/21/2022 HBO Details Patient Name: Date of Service: NA [name] 01/21/2022 10:00 A M Medical Record Number: [id number] Patient Account Number: [id number] Date of Birth/Sex: Treating RN: 18-Dec-1955 (66 y.o. [name] Primary Care Milburn Freeney: [name] Other Clinician: [name] Referring Tyra Gural: Treating Nancy Arvin/Extender: [name]: 21 HBO Treatment Course Details Treatment Course Number: 1 Ordering Jesscia Imm: [name] T Treatments Ordered: otal 120 HBO Treatment Start Date: 09/20/2021 HBO Indication: Diabetic Ulcer(s) of the Lower Extremity Standard/Conservative Wound Care tried and failed greater than or equal to 30 days HBO Treatment Details Treatment Number: 81 Patient Type: Outpatient Chamber Type: Monoplace Chamber Serial #: [id number] Treatment Protocol: 2.0 ATA with 90 minutes oxygen, with two 5 minute air breaks Treatment Details Compression Rate Down: 2.0 psi / minute De-Compression Rate Up: 2.0 psi / minute A breaks and breathing ir Compress Tx Pressure periods Decompress Decompress Begins Reached (leave unused spaces Begins Ends blank) Chamber Pressure (ATA 1 2 2 2 2 2  --2 1 ) Clock Time (24 hr) 09:02 09:13 09:43 09:48 10:18 10:23 - - 10:53 10:54 Treatment Length: 112 (minutes) Treatment Segments: 4 Vital Signs Capillary Blood Glucose Reference Range: 80 - 120 mg / dl HBO Diabetic Blood Glucose Intervention Range: <131 mg/dl or mg/dl Type: Time Vitals Blood Respiratory Capillary Blood Glucose Pulse Action Pulse: Temperature: Taken: Pressure: Rate: Glucose (mg/dl): Meter #: Oximetry (%) Taken: Pre 08:55 136/75 86 16 97.4 195 none per protocol Post 11:09 157/90 72 16 98.5 185 none per protocol Treatment Response Treatment Toleration: Well Treatment Completion Status: Treatment Completed without Adverse Event Additional Procedure  Documentation Tissue Sevierity: Fat layer exposed Electronic Signature(s) Signed: 01/21/2022 12:52:08 PM By: 03/23/2022 CHT EMT BS , , Signed: 01/21/2022 3:37:27 PM By: 03/23/2022 MD FACS Previous Signature: 01/21/2022 9:12:02 AM Version By: 03/23/2022 EMT Previous Signature: 01/21/2022 9:11:42 AM Version By: 03/23/2022 EMT Previous Signature: 01/21/2022 9:10:42 AM Version By: 03/23/2022 EMT Entered By: [name] on 01/21/2022 12:52:08 -------------------------------------------------------------------------------- HBO Safety Checklist Details Patient Name: Date of Service: 03/23/2022 A. 01/21/2022 10:00 A M Medical Record Number: 03/23/2022 Patient Account Number: [id number] Date of Birth/Sex: Treating RN: 29-Apr-1956 (66 y.o. 79 Primary Care [name]: [name] Other Clinician: [name] Referring [name]: Treating [name]/Extender: [name]: 21 HBO Safety Checklist Items Safety Checklist Consent Form Signed Patient voided / foley secured and emptied When did you last eato 0700 Last dose of injectable or oral agent 0700 Ostomy pouch emptied and vented if applicable NA All implantable devices assessed, documented and approved NA Intravenous access site secured and place NA Valuables secured Linens and cotton and cotton/polyester blend (less than 51% polyester) Personal oil-based products / skin lotions / body lotions removed Wigs or hairpieces removed NA Smoking or tobacco materials removed Books / newspapers / magazines / loose paper removed Cologne, aftershave, perfume and deodorant removed Jewelry removed (may wrap wedding band) NA Make-up removed NA Hair care products removed Battery operated devices (external) removed Heating patches and chemical warmers removed Titanium eyewear removed NA Nail polish cured greater than 10 hours NA Casting material cured  greater than 10 hours NA Hearing aids removed NA Loose dentures or partials removed NA Prosthetics have been removed NA Patient demonstrates correct use of air break device (if applicable) Patient concerns have been addressed Patient grounding bracelet on and cord attached to chamber Specifics  for Inpatients (complete in addition to above) Medication sheet sent with patient NA Intravenous medications needed or due during therapy sent with patient NA Drainage tubes (e.g. nasogastric tube or chest tube secured and vented) NA Endotracheal or Tracheotomy tube secured NA Cuff deflated of air and inflated with saline NA Airway suctioned NA Notes The safety checklist was done before the treatment was started. Electronic Signature(s) Signed: 01/21/2022 9:10:07 AM By: [name] EMT Entered By: [name] on 01/21/2022 09:10:06

## 2022-01-21 NOTE — Progress Notes (Signed)
DAIL, LEREW ([id number]) Visit Report for 01/21/2022 SuperBill Details Patient Name: Date of Service: NA [name] 01/21/2022 Medical Record Number: [id number] Patient Account Number: [id number] Date of Birth/Sex: Treating RN: January 06, 1956 (66 y.o. 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 Facility Procedures CPT4 Code Description Modifier Quantity [id number]-(Facility Use Only) HBOT full body chamber, , 4 ICD-10 Diagnosis Description E11.621 Type 2 diabetes mellitus with foot ulcer 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 M86.672 Other chronic osteomyelitis, 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

## 2022-01-21 NOTE — Progress Notes (Addendum)
HRISHIKESH, HOEG ([id number]) Visit Report for 01/21/2022 Chief Complaint Document Details Patient Name: Date of Service: NA [name] 01/21/2022 12:45 PM Medical Record Number: [id number] Patient Account Number: [id number] Date of Birth/Sex: Treating RN: 1955/11/07 (66 y.o. [name] Primary Care Provider: [name] Other Clinician: Referring Provider: Treating Provider/Extender: [name] in Treatment: 21 Information Obtained from: Patient Chief Complaint Patients presents for treatment of an open diabetic ulcer with exposed bone and osteomyelitis Electronic Signature(s) Signed: 01/21/2022 12:37:00 PM By: [name] MD FACS Entered By: [name] on 01/21/2022 12:36:59 -------------------------------------------------------------------------------- Debridement Details Patient Name: Date of Service: NA Colin Nephew A. 01/21/2022 12:45 PM Medical Record Number: [id number] Patient Account Number: [id number] Date of Birth/Sex: Treating RN: June 24, 1955 (66 y.o. [name] Primary Care Provider: [name] Other Clinician: Referring Provider: Treating Provider/Extender: [name]: 21 Debridement Performed for Assessment: Wound #1 Left,Medial Foot Performed By: Physician [name], MD Debridement Type: Debridement Severity of Tissue Pre Debridement: Fat layer exposed Level of Consciousness (Pre-procedure): Awake and Alert Pre-procedure Verification/Time Out Yes - 12:06 Taken: Start Time: 12:06 Pain Control: Lidocaine 4% T opical Solution T Area Debrided (L x W): otal 3.7 (cm) x 2.5 (cm) = 9.25 (cm) Tissue and other material debrided: Non-Viable, Slough, Skin: Epidermis, Slough Level: Skin/Epidermis Debridement Description: Selective/Open Wound Instrument: Curette Bleeding: Minimum Hemostasis Achieved: Pressure Procedural Pain: 0 Post Procedural Pain: 0 Response to  Treatment: Procedure was tolerated well Level of Consciousness (Post- Awake and Alert procedure): Post Debridement Measurements of Total Wound Length: (cm) 3.7 Width: (cm) 2.5 Depth: (cm) 0.3 Volume: (cm) 2.179 Character of Wound/Ulcer Post Debridement: Improved Severity of Tissue Post Debridement: Fat layer exposed Post Procedure Diagnosis Same as Pre-procedure Electronic Signature(s) Signed: 01/21/2022 3:37:27 PM By: [name] MD FACS Signed: 01/21/2022 4:07:48 PM By: Samuella Bruin Entered By: Samuella Bruin on 01/21/2022 12:53:08 -------------------------------------------------------------------------------- HPI Details Patient Name: Date of Service: NA Colin [name] 01/21/2022 12:45 PM Medical Record Number: [id number] Patient Account Number: [id number] Date of Birth/Sex: Treating RN: 11-20-1955 (66 y.o. [name] Primary Care Provider: [name] Other Clinician: Referring Provider: Treating Provider/Extender: [name] in Treatment: 21 History of Present Illness HPI Description: ADMISSION 08/25/2021 This is a 66 year old man who initially presented to his primary care provider in September 2022 with pain in his left foot. He was sent for an x-ray and while the x-ray was being performed, the tech pointed out a wound on his foot that the patient was not aware existed. He does have type 2 diabetes with significant neuropathy. His diabetes is suboptimally controlled with his most recent A1c being 8.5. He also has a history of coronary artery disease status post three- vessel CABG. he was initially seen by orthopedics, but they referred him to [facility]. He has undergone at least 7 operations/debridements and several applications of skin substitute under the care of podiatry. He has been in a wound VAC for much of this time. His most recent procedure was July 28, 2021. A portion of the talus was biopsied  and was found to be consistent with osteomyelitis. Culture also returned positive for corynebacterium. He was seen on August 16, 2021 by infectious disease. A PICC line has been placed and he will be receiving a 6-week course of IV daptomycin and cefepime. In October 2022, he underwent lower extremity vascular studies. Results are copied here: Right: Resting right  ankle-brachial index is within normal range. No evidence of significant right lower extremity arterial disease. The right toe-brachial index is abnormal. Left: Resting left ankle-brachial index indicates mild left lower extremity arterial disease. The left toe-brachial index is abnormal. He has not been seen by vascular surgery despite these findings. He presented to clinic today in a cam boot and is using a knee scooter to offload. Wound VAC was in place. Once this was removed, a large ulcer was identified on the left midfoot/ankle. Bone is frankly exposed. There is no malodorous or purulent drainage. There is some granulation tissue over the central portion of the exposed bone. There is a tunnel that extends posteriorly for roughly 10 cm. It has been discussed with him by multiple providers that he is at very high risk of losing his lower leg because of this wound. He is extremely eager to avoid this outcome and is here today to review his options as well as receive ongoing wound care. 09/03/2021: Here for reevaluation of his wound. There does not appear to have been any substantial improvement overall since our last visit. He has been in a wound VAC with white foam overlying the exposed bone. We are working on getting him approved for hyperbaric oxygen therapy. 09/10/2021: We are in the process of getting him cleared to begin hyperbaric oxygen therapy. He still needs to obtain a chest x-ray. Although the wound measurements are roughly the same, I think the overall appearance of the wound is better. The exposed bone has a bit more  granulation tissue covering it. He has not received a vascular surgery appointment to reevaluate his flow to the wound. 09/17/2021: He has been approved for hyperbaric oxygen therapy and completed his chest x-ray, which I reviewed and it appears normal. The tunnels at the 12 and 10:00 positions are smaller. There is more granulation tissue covering the exposed bone and the undermining has decreased. He still has not received a vascular surgery appointment. 09/24/2021: He initiated hyperbaric oxygen therapy this week and is tolerating it well. He has an appointment with vascular surgery coming up on May 16. The granulation tissue is covering more of the exposed bone and both tunnels are a bit smaller. 10/01/2021: He continues to tolerate hyperbaric oxygen therapy. He saw infectious disease and they are planning to pull his PICC line. He has been initiated on oral antibiotics (doxycycline and Augmentin). The wound looks about the same but the tunnels are a little bit smaller. The skin seems to be contracting somewhat around the exposed bone. 10/08/2021: The wound is still about the same size, but the tunnels continue to come in and the skin is contracting around the exposed bone. He continues to have some accumulation of necrotic material in the inferoposterior aspect of the wound as well as accumulation at the 12:00 tunnel area. 10/15/2021: The wound is smaller today. The tunnels continue to come in. There is less necrotic tissue present. He does have some periwound maceration. 10/22/2021: The wound is about the same size. There is a little bit less undermining at the distal portion. The exposed bone is dark and I am not sure if this is staining from silver nitrate or his VAC sponge or if it represents necrosis. The tunnels are shallower but he does have some serous drainage coming from the 10:00 tunnel. He continues to tolerate hyperbaric oxygen therapy well. 10/29/2021: The undermining continues to improve.  The tunnels are about the same. He has good granulation tissue overlying the majority of  the exposed bone. It does appear that perhaps the tubing from his wound VAC has been eroding the skin at the 12 clock position. He continues to accumulate senescent epithelium around the borders of the wound. 11/05/2021: The undermining is almost completely resolved. The tunnels have contracted fairly significantly. No significant slough or debris accumulation. There is still senescent epithelium accumulation around the borders of the wound. He has been tolerating hyperbaric oxygen therapy well. 11/12/2021: Despite the measurements of the wound being about the same, the wound has changed in its shape and overall, I think it is improved. The undermining has resolved and the tunnels continue to shorten. There is good granulation tissue encroaching over the small area of bone that has remained exposed at the 12 o'clock position. Minimal slough accumulation. He continues to tolerate hyperbaric oxygen therapy well. 11/19/2021: I took a PCR culture last week. There was overgrowth of yeast. He is already taking suppressive doxycycline and Augmentin. I added fluconazole to his regimen. The wound is smaller again today. The tunnels continue to shorten. He continues to do well with hyperbaric oxygen therapy. 11/26/2021: For some reason, his foot has become macerated. The wound is narrower but about the same dimensions in its longitudinal aspect. The tunnels continue to shorten. He has some slough buildup on the wound as well as some heaped up senescent epithelium around the perimeter. 12/03/2021: No further maceration of his foot has occurred. The wound has contracted quite significantly from last week. The tunnel at 10:00 is closed. The tunnel at 12:00 is down to just a couple of millimeters. No other undermining is present. There is soft tissue coverage of the previously exposed bone. There is just a bit of slough and biofilm on  the wound surface. 12/10/2021: The wound is looking good. It turns out the tunnel at 12:00 is only exposed when the patient dorsiflexes his foot. It is about 2 cm in depth when he does this; when his foot is in plantarflexion, the tunnel is closed. The bone that was visible at the 12:00 tunnel is completely covered with granulation tissue, but there does feel like some exposed bone deeper into the tunnel area. There is senescent skin heaped up around the periphery. Minimal slough on the wound surface. 12/16/2021: The wound dimensions are roughly the same. The surface has nice granulation tissue. The exposed bone at the 12:00 tunnel continues to be covered with more soft tissue. 7/14; patient's wound measures smaller today. Using the wound VAC with underlying collagen. He is also being treated with HBO for underlying osteomyelitis. He tells me he is on doxycycline and ampicillin follows with infectious disease next week 12/31/2021: The wound continues to contract. Unfortunately, the area where the track pad and tubing have been rubbing continues to look like it is applying friction. He says that the home health nurses that have been applying the [facility] have been putting gauze underneath the tubing, but nonetheless there is ongoing tissue breakdown at this site. Light slough accumulation on the wound surface. The tunnel continues to contract. He is tolerating HBO without difficulty. 01/07/2022: Bridging the wound VAC away from the ankle has resulted in significant improvement in the tissue at the apex of the wound. The tunnel is still present and is not all that much shorter, but the overall wound surface is very robust and healthy looking. Minimal slough accumulation. No concern for acute infection. 01/21/2022: The wound continues to contract and has a robust granulation tissue surface. The tunnel has come in considerably  and is down to about 1.4 cm. There is still bone exposed within the tunnel but the rest  of it is well covered. There is some senescent epithelium at the wound margins and a little bit of slough on the surface. Electronic Signature(s) Signed: 01/21/2022 12:37:59 PM By: [name] MD FACS Entered By: [name] on 01/21/2022 12:37:59 -------------------------------------------------------------------------------- Physical Exam Details Patient Name: Date of Service: NA [name] 01/21/2022 12:45 PM Medical Record Number: [id number] Patient Account Number: [id number] Date of Birth/Sex: Treating RN: Oct 09, 1955 (66 y.o. [name] Primary Care Provider: [name] Other Clinician: Referring Provider: Treating Provider/Extender: [name]: 21 Constitutional Hypertensive, asymptomatic. . . . No acute distress.Marland Kitchen Respiratory . Notes 01/21/2022: The wound continues to contract and has a robust granulation tissue surface. The tunnel has come in considerably and is down to about 1.4 cm. There is still bone exposed within the tunnel but the rest of it is well covered. There is some senescent epithelium at the wound margins and a little bit of slough on the surface. Electronic Signature(s) Signed: 01/21/2022 12:43:06 PM By: [name] MD FACS Entered By: [name] on 01/21/2022 12:43:06 -------------------------------------------------------------------------------- Physician Orders Details Patient Name: Date of Service: NA Colin Nephew A. 01/21/2022 12:45 PM Medical Record Number: [id number] Patient Account Number: [id number] Date of Birth/Sex: Treating RN: 1956-02-27 (66 y.o. 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 - Dr. Lady Gary - Room 2 - Return A Bathing/ Shower/ Hygiene May shower with protection but do not get wound dressing(s) wet. Negative Presssure Wound Therapy Wound Vac to wound continuously at 167mm/hg pressure Black Foam White Foam - Apply white foam or saline moistened gauze to tunnel at 10:00 and 12:00 Other: - Apply silver collagen on wound bed, under black foam Home Health New wound care orders this week; continue Home Health for wound care. May utilize formulary equivalent dressing for wound treatment orders unless otherwise specified. - recommend bridging the wound vac to avoid pressure on the wound Dressing changes to be completed by Home Health on Monday / Wednesday / Friday except when patient has scheduled visit at [facility]. Other Home Health Orders/Instructions: [name]: Change vac 3x week Mon. Wed. and Fri. after wound care visit. Hyperbaric Oxygen Therapy Evaluate for HBO Therapy Indication: - Wagner 3 diabetic ulcer left foot If appropriate for treatment, begin HBOT per protocol: 2.5 ATA for 90 Minutes with 2 Five (5) Minute A Breaks ir Total Number of Treatments: - 12/31/2021 additional 40 treatments. T of 120 otal One treatments per day (delivered Monday through Friday unless otherwise specified in Special Instructions below): Finger stick Blood Glucose Pre- and Post- HBOT Treatment. Follow Hyperbaric Oxygen Glycemia Protocol A frin (Oxymetazoline HCL) 0.05% nasal spray - 1 spray in both nostrils daily as needed prior to HBO treatment for difficulty clearing ears Wound Treatment Wound #1 - Foot Wound Laterality: Left, Medial Cleanser: Wound Cleanser 3 x  Per Week/30 Days Discharge Instructions: Cleanse the wound with wound  cleanser prior to applying a clean dressing using gauze sponges, not tissue or cotton balls. Peri-Wound Care: Zinc Oxide Ointment 30g tube 3 x Per Week/30 Days Discharge Instructions: Apply Zinc Oxide to periwound with each dressing change Prim Dressing: Promogran Prisma Matrix, 4.34 (sq in) (silver collagen) 3 x Per Week/30 Days ary Discharge Instructions: Moisten collagen with saline or hydrogel Secondary Dressing: ABD Pad, 5x9 3 x Per Week/30 Days Discharge Instructions: In clinic. Home Health to apply wound vac. Secondary Dressing: Woven Gauze Sponge, Non-Sterile 4x4 in 3 x Per Week/30 Days Discharge Instructions: In clinic. Home Health to apply wound vac. Secured With: [employer], 4.5x3.1 (in/yd) 3 x Per Week/30 Days Discharge Instructions: In clinic. Home Health to apply wound vac. Secured With: 69M Medipore H Soft Cloth Surgical T ape, 4 x 10 (in/yd) 3 x Per Week/30 Days Discharge Instructions: In clinic. Home Health to apply wound vac. GLYCEMIA INTERVENTIONS PROTOCOL PRE-HBO GLYCEMIA INTERVENTIONS ACTION INTERVENTION Obtain pre-HBO capillary blood glucose (ensure 1 physician order is in chart). A. Notify HBO physician and await physician orders. 2 If result is 70 mg/dl or below: B. If the result meets the hospital definition of a critical result, follow hospital policy. A. Give patient an 8 ounce Glucerna Shake, an 8 ounce Ensure, or 8 ounces of a Glucerna/Ensure equivalent dietary supplement*. B. Wait 30 minutes. If result is 71 mg/dl to 329 mg/dl: C. Retest patients capillary blood glucose (CBG). D. If result greater than or equal to 110 mg/dl, proceed with HBO. If result less than 110 mg/dl, notify HBO physician and consider holding HBO. If result is 131 mg/dl to 518 mg/dl: A. Proceed with HBO. A. Notify HBO physician and await physician orders. B. It is recommended to hold HBO and  do If result is 250 mg/dl or greater: blood/urine ketone testing. C. If the result meets the hospital definition of a critical result, follow hospital policy. POST-HBO GLYCEMIA INTERVENTIONS ACTION INTERVENTION Obtain post HBO capillary blood glucose (ensure 1 physician order is in chart). A. Notify HBO physician and await physician orders. 2 If result is 70 mg/dl or below: B. If the result meets the hospital definition of a critical result, follow hospital policy. A. Give patient an 8 ounce Glucerna Shake, an 8 ounce Ensure, or 8 ounces of a Glucerna/Ensure equivalent dietary supplement*. B. Wait 15 minutes for symptoms of If result is 71 mg/dl to 841 mg/dl: hypoglycemia (i.e. nervousness, anxiety, sweating, chills, clamminess, irritability, confusion, tachycardia or dizziness). C. If patient asymptomatic, discharge patient. If patient symptomatic, repeat capillary blood glucose (CBG) and notify HBO physician. If result is 101 mg/dl to 660 mg/dl: A. Discharge patient. A. Notify HBO physician and await physician orders. B. It is recommended to do blood/urine ketone If result is 250 mg/dl or greater: testing. C. If the result meets the hospital definition of a critical result, follow hospital policy. *Juice or candies are NOT equivalent products. If patient refuses the Glucerna or Ensure, please consult the hospital dietitian for an appropriate substitute. Electronic Signature(s) Signed: 01/21/2022 3:37:27 PM By: [name] MD FACS Signed: 01/21/2022 4:07:48 PM By: Gelene Mink By: Samuella Bruin on 01/21/2022 12:54:59 -------------------------------------------------------------------------------- Problem List Details Patient Name: Date of Service: NA Colin Nephew A. 01/21/2022 12:45 PM Medical Record Number: [id number] Patient Account Number: [id number] Date of Birth/Sex: Treating RN: January 09, 1956 (66 y.o. 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 01/21/2022 12:45 PM Medical Record Number: [id number] Patient Account Number: [id number] Date of Birth/Sex: Treating RN: 10/02/1955 (66 y.o. [name] Primary Care Provider: [name] Other Clinician: Referring Provider: Treating Provider/Extender: [name] in Treatment: 21 Subjective Chief Complaint Information obtained from Patient Patients presents for treatment of an open diabetic ulcer with exposed bone and osteomyelitis History of Present Illness (HPI) ADMISSION 08/25/2021 This is a 66 year old man who initially presented to his primary care provider in September 2022 with pain in his left foot. He was sent for an x-ray and while the x-ray was being performed, the tech pointed out a wound on his foot that the patient was not aware existed. He does have type 2 diabetes with significant neuropathy. His diabetes is suboptimally controlled with his most recent A1c being 8.5. He also has a history of  coronary artery disease status post three- vessel CABG. he was initially seen by orthopedics, but they referred him to [facility]. He has undergone at least 7 operations/debridements and several applications of skin substitute under the care of podiatry. He has been in a wound VAC for much of this time. His most recent procedure was July 28, 2021. A portion of the talus was biopsied and was found to be consistent with osteomyelitis. Culture also returned positive for corynebacterium. He was seen on August 16, 2021 by infectious disease. A PICC line has been placed and he will be receiving a 6-week course of IV daptomycin and cefepime. In October 2022, he underwent lower extremity vascular studies. Results are copied here: Right: Resting right ankle-brachial index is within normal range. No evidence of significant right lower extremity arterial disease. The right toe-brachial index is abnormal. Left: Resting left ankle-brachial index indicates mild left lower extremity arterial disease. The left toe-brachial index is abnormal. He has not been seen by vascular surgery despite these findings. He presented to clinic today in a cam boot and is using a knee scooter to offload. Wound VAC was in place. Once this was removed, a large ulcer was identified on the left midfoot/ankle. Bone is frankly exposed. There is no malodorous or purulent drainage. There is some granulation tissue over the central portion of the exposed bone. There is a tunnel that extends posteriorly for roughly 10 cm. It has been discussed with him by multiple providers that he is at very high risk of losing his lower leg because of this wound. He is extremely eager to avoid this outcome and is here today to review his options as well as receive ongoing wound care. 09/03/2021: Here for reevaluation of his wound. There does not appear to have been any substantial improvement overall since our last visit. He has been in  a wound VAC with white foam overlying the exposed bone. We are working on getting him approved for hyperbaric oxygen therapy. 09/10/2021:  We are in the process of getting him cleared to begin hyperbaric oxygen therapy. He still needs to obtain a chest x-ray. Although the wound measurements are roughly the same, I think the overall appearance of the wound is better. The exposed bone has a bit more granulation tissue covering it. He has not received a vascular surgery appointment to reevaluate his flow to the wound. 09/17/2021: He has been approved for hyperbaric oxygen therapy and completed his chest x-ray, which I reviewed and it appears normal. The tunnels at the 12 and 10:00 positions are smaller. There is more granulation tissue covering the exposed bone and the undermining has decreased. He still has not received a vascular surgery appointment. 09/24/2021: He initiated hyperbaric oxygen therapy this week and is tolerating it well. He has an appointment with vascular surgery coming up on May 16. The granulation tissue is covering more of the exposed bone and both tunnels are a bit smaller. 10/01/2021: He continues to tolerate hyperbaric oxygen therapy. He saw infectious disease and they are planning to pull his PICC line. He has been initiated on oral antibiotics (doxycycline and Augmentin). The wound looks about the same but the tunnels are a little bit smaller. The skin seems to be contracting somewhat around the exposed bone. 10/08/2021: The wound is still about the same size, but the tunnels continue to come in and the skin is contracting around the exposed bone. He continues to have some accumulation of necrotic material in the inferoposterior aspect of the wound as well as accumulation at the 12:00 tunnel area. 10/15/2021: The wound is smaller today. The tunnels continue to come in. There is less necrotic tissue present. He does have some periwound maceration. 10/22/2021: The wound is about the same  size. There is a little bit less undermining at the distal portion. The exposed bone is dark and I am not sure if this is staining from silver nitrate or his VAC sponge or if it represents necrosis. The tunnels are shallower but he does have some serous drainage coming from the 10:00 tunnel. He continues to tolerate hyperbaric oxygen therapy well. 10/29/2021: The undermining continues to improve. The tunnels are about the same. He has good granulation tissue overlying the majority of the exposed bone. It does appear that perhaps the tubing from his wound VAC has been eroding the skin at the 12 clock position. He continues to accumulate senescent epithelium around the borders of the wound. 11/05/2021: The undermining is almost completely resolved. The tunnels have contracted fairly significantly. No significant slough or debris accumulation. There is still senescent epithelium accumulation around the borders of the wound. He has been tolerating hyperbaric oxygen therapy well. 11/12/2021: Despite the measurements of the wound being about the same, the wound has changed in its shape and overall, I think it is improved. The undermining has resolved and the tunnels continue to shorten. There is good granulation tissue encroaching over the small area of bone that has remained exposed at the 12 o'clock position. Minimal slough accumulation. He continues to tolerate hyperbaric oxygen therapy well. 11/19/2021: I took a PCR culture last week. There was overgrowth of yeast. He is already taking suppressive doxycycline and Augmentin. I added fluconazole to his regimen. The wound is smaller again today. The tunnels continue to shorten. He continues to do well with hyperbaric oxygen therapy. 11/26/2021: For some reason, his foot has become macerated. The wound is narrower but about the same dimensions in its longitudinal aspect. The tunnels continue to shorten. He  has some slough buildup on the wound as well as some heaped  up senescent epithelium around the perimeter. 12/03/2021: No further maceration of his foot has occurred. The wound has contracted quite significantly from last week. The tunnel at 10:00 is closed. The tunnel at 12:00 is down to just a couple of millimeters. No other undermining is present. There is soft tissue coverage of the previously exposed bone. There is just a bit of slough and biofilm on the wound surface. 12/10/2021: The wound is looking good. It turns out the tunnel at 12:00 is only exposed when the patient dorsiflexes his foot. It is about 2 cm in depth when he does this; when his foot is in plantarflexion, the tunnel is closed. The bone that was visible at the 12:00 tunnel is completely covered with granulation tissue, but there does feel like some exposed bone deeper into the tunnel area. There is senescent skin heaped up around the periphery. Minimal slough on the wound surface. 12/16/2021: The wound dimensions are roughly the same. The surface has nice granulation tissue. The exposed bone at the 12:00 tunnel continues to be covered with more soft tissue. 7/14; patient's wound measures smaller today. Using the wound VAC with underlying collagen. He is also being treated with HBO for underlying osteomyelitis. He tells me he is on doxycycline and ampicillin follows with infectious disease next week 12/31/2021: The wound continues to contract. Unfortunately, the area where the track pad and tubing have been rubbing continues to look like it is applying friction. He says that the home health nurses that have been applying the [facility] have been putting gauze underneath the tubing, but nonetheless there is ongoing tissue breakdown at this site. Light slough accumulation on the wound surface. The tunnel continues to contract. He is tolerating HBO without difficulty. 01/07/2022: Bridging the wound VAC away from the ankle has resulted in significant improvement in the tissue at the apex of the wound. The  tunnel is still present and is not all that much shorter, but the overall wound surface is very robust and healthy looking. Minimal slough accumulation. No concern for acute infection. 01/21/2022: The wound continues to contract and has a robust granulation tissue surface. The tunnel has come in considerably and is down to about 1.4 cm. There is still bone exposed within the tunnel but the rest of it is well covered. There is some senescent epithelium at the wound margins and a little bit of slough on the surface. Patient History Information obtained from Patient. Family History Cancer - Father, Diabetes - Father,Mother,Paternal Grandparents, Heart Disease - Father, Hypertension - Father, No family history of Hereditary Spherocytosis, Kidney Disease, Lung Disease, Seizures, Stroke, Thyroid Problems, Tuberculosis. Social History Never smoker, Marital Status - Married, Alcohol Use - Rarely, Drug Use - No History, Caffeine Use - Daily. Medical History Eyes Patient has history of Cataracts - Removed 2008 Cardiovascular Patient has history of Coronary Artery Disease, Hypertension, Myocardial Infarction, Peripheral Arterial Disease Endocrine Patient has history of Type II Diabetes Musculoskeletal Patient has history of Osteomyelitis Neurologic Patient has history of Neuropathy Medical A Surgical History Notes nd Cardiovascular Hypercholesterolemia Abnormal EKG CABG X3 2019 Gastrointestinal GERD Musculoskeletal Diabetic foot ulcer Objective Constitutional Hypertensive, asymptomatic. No acute distress.. Vitals Time Taken: 11:09 AM, Height: 74 in, Weight: 186 lbs, BMI: 23.9, Temperature: 98.5 F, Pulse: 72 bpm, Respiratory Rate: 16 breaths/min, Blood Pressure: 157/90 mmHg, Capillary Blood Glucose: 185 mg/dl. General Notes: 01/21/2022: The wound continues to contract and has a robust granulation tissue  surface. The tunnel has come in considerably and is down to about 1.4 cm. There is still  bone exposed within the tunnel but the rest of it is well covered. There is some senescent epithelium at the wound margins and a little bit of slough on the surface. Integumentary (Hair, Skin) Wound #1 status is Open. Original cause of wound was Gradually Appeared. The date acquired was: 02/22/2021. The wound has been in treatment 21 weeks. The wound is located on the Left,Medial Foot. The wound measures 3.7cm length x 2.5cm width x 0.3cm depth; 7.265cm^2 area and 2.179cm^3 volume. There is Fat Layer (Subcutaneous Tissue) exposed. There is no undermining noted, however, there is tunneling at 1:00 with a maximum distance of 1.4cm. There is a medium amount of serosanguineous drainage noted. The wound margin is distinct with the outline attached to the wound base. There is large (67-100%) red, hyper - granulation within the wound bed. There is a small (1-33%) amount of necrotic tissue within the wound bed including Adherent Slough. 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 There was a Selective/Open Wound Skin/Epidermis Debridement with a total area of 9.25 sq cm performed by [name], MD. With the following instrument(s): Curette to remove Non-Viable tissue/material. Material removed includes [facility] and Skin: Epidermis and after achieving pain control using Lidocaine 4% Topical Solution. No specimens were taken. A time out was conducted at 12:06, prior to the start of the procedure. A Minimum amount of bleeding was  controlled with Pressure. The procedure was tolerated well with a pain level of 0 throughout and a pain level of 0 following the procedure. Post Debridement Measurements: 3.7cm length x 2.5cm width x 0.3cm depth; 2.179cm^3 volume. Character of Wound/Ulcer Post Debridement is improved. Severity of Tissue Post Debridement is: Fat layer exposed. Post procedure Diagnosis Wound #1: Same as Pre-Procedure Plan Follow-up Appointments: Return Appointment in 1 week. - Dr. Lady Gary - Room 2 - Bathing/ Shower/ Hygiene: May shower with protection but do not get wound dressing(s) wet. Negative Presssure Wound Therapy: Wound Vac to wound continuously at 132mm/hg pressure Black Foam White Foam - Apply white foam or saline moistened gauze to tunnel at 10:00 and 12:00 Other: - Apply silver collagen on wound bed, under black foam Home Health: New wound care orders this week; continue Home Health for wound care. May utilize formulary equivalent dressing for wound treatment orders unless otherwise specified. - recommend bridging the wound vac to avoid pressure on the wound Dressing changes to be completed by Home Health on Monday / Wednesday / Friday except when patient has scheduled visit at [facility]. Other Home Health Orders/Instructions: [name]: Change vac 3x week Mon. Wed. and Fri. after wound care visit. Hyperbaric Oxygen Therapy: Evaluate for HBO Therapy Indication: - Wagner 3 diabetic ulcer left foot If appropriate for treatment, begin HBOT per protocol: 2.5 ATA for 90 Minutes with 2 Five (5) Minute Air Breaks T Number of Treatments: - 12/31/2021 additional 40 treatments. T of 120 otal otal One treatments per day (delivered Monday through Friday unless otherwise specified in Special Instructions below): Finger stick  Blood Glucose Pre- and Post- HBOT Treatment. Follow Hyperbaric Oxygen Glycemia Protocol Afrin (Oxymetazoline HCL) 0.05% nasal spray - 1 spray in both nostrils daily as needed  prior to HBO treatment for difficulty clearing ears WOUND #1: - Foot Wound Laterality: Left, Medial Cleanser: Wound Cleanser 3 x Per Week/30 Days Discharge Instructions: Cleanse the wound with wound cleanser prior to applying a clean dressing using gauze sponges, not tissue or cotton balls. Peri-Wound Care: Zinc Oxide Ointment 30g tube 3 x Per Week/30 Days Discharge Instructions: Apply Zinc Oxide to periwound with each dressing change Prim Dressing: Promogran Prisma Matrix, 4.34 (sq in) (silver collagen) 3 x Per Week/30 Days ary Discharge Instructions: Moisten collagen with saline or hydrogel Secondary Dressing: ABD Pad, 5x9 3 x Per Week/30 Days Discharge Instructions: In clinic. Home Health to apply wound vac. Secondary Dressing: Woven Gauze Sponge, Non-Sterile 4x4 in 3 x Per Week/30 Days Discharge Instructions: In clinic. Home Health to apply wound vac. Secured With: [employer] Roll Sterile, 4.5x3.1 (in/yd) 3 x Per Week/30 Days Discharge Instructions: In clinic. Home Health to apply wound vac. Secured With: 50M Medipore H Soft Cloth Surgical T ape, 4 x 10 (in/yd) 3 x Per Week/30 Days Discharge Instructions: In clinic. Home Health to apply wound vac. 01/21/2022: The wound continues to contract and has a robust granulation tissue surface. The tunnel has come in considerably and is down to about 1.4 cm. There is still bone exposed within the tunnel but the rest of it is well covered. There is some senescent epithelium at the wound margins and a little bit of slough on the surface. I used a curette to debride slough and senescent skin from the wound surface. Will continue using collagen, but I think the benefit from skin substitute approved through his insurance. I would like to try Apligraf. Follow-up in 1 week. Will continue his hyperbaric oxygen therapy in the interim. Electronic Signature(s) Signed: 01/24/2022 8:17:33 AM By: Duanne Guessannon, Yaniel Limbaugh MD FACS Previous Signature: 01/21/2022 12:44:41 PM  Version By: Duanne Guessannon, Delinda Malan MD FACS Entered By: Duanne Guessannon, Felisa Zechman on 01/24/2022 08:17:32 -------------------------------------------------------------------------------- HxROS Details Patient Name: Date of Service: NA Colin [name], [name] 01/21/2022 12:45 PM Medical Record Number: [id number] Patient Account Number: [id number] Date of Birth/Sex: Treating RN: 06-17-1955 (66 y.o. Marlan PalauM) Herrington, Taylor Primary Care Provider: Simone CuriaLee, Keung Other Clinician: Referring Provider: Treating Provider/Extender: [name], Eyal Greenhaw Lee, Keung Weeks in Treatment: 21 Information Obtained From Patient Eyes Medical History: Positive for: Cataracts - Removed 2008 Cardiovascular Medical History: Positive for: Coronary Artery Disease; Hypertension; Myocardial Infarction; Peripheral Arterial Disease Past Medical History Notes: Hypercholesterolemia Abnormal EKG CABG X3 2019 Gastrointestinal Medical History: Past Medical History Notes: GERD Endocrine Medical History: Positive for: Type II Diabetes Time with diabetes: 24 years Treated with: Insulin, Oral agents Blood sugar tested every day: Yes Tested : Musculoskeletal Medical History: Positive for: Osteomyelitis Past Medical History Notes: Diabetic foot ulcer Neurologic Medical History: Positive for: Neuropathy HBO Extended History Items Eyes: Cataracts Immunizations Pneumococcal Vaccine: Received Pneumococcal Vaccination: Yes Received Pneumococcal Vaccination On or After 60th Birthday: Yes Implantable Devices Yes Family and Social History Cancer: Yes - Father; Diabetes: Yes - Father,Mother,Paternal Grandparents; Heart Disease: Yes - Father; Hereditary Spherocytosis: No; Hypertension: Yes - Father; Kidney Disease: No; Lung Disease: No; Seizures: No; Stroke: No; Thyroid Problems: No; Tuberculosis: No; Never smoker; Marital Status - Married; Alcohol Use: Rarely; Drug Use: No History; Caffeine Use: Daily; Financial Concerns: No; Food, Clothing or  Shelter Needs: No; Support System Lacking: No; Transportation Concerns: No Psychologist, prison and probation serviceslectronic Signature(s) Signed:  01/21/2022 3:37:27 PM By: [name] MD FACS Signed: 01/21/2022 4:07:48 PM By: Gelene Mink By: [name] on 01/21/2022 12:39:49 -------------------------------------------------------------------------------- SuperBill Details Patient Name: Date of Service: NA Colin [name] 01/21/2022 Medical Record Number: [id number] Patient Account Number: [id number] Date of Birth/Sex: Treating RN: Aug 28, 1955 (66 y.o. 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 Facility Procedures CPT4 Code: [id number] Description: [phone] - DEBRIDE WOUND 1ST 20 SQ CM OR < ICD-10 Diagnosis Description L97.324 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 Modifier: Quantity: 1 Physician Procedures : CPT4 Code Description Modifier [id number] - WC PHYS LEVEL 4 - EST PT 25 ICD-10 Diagnosis Description L97.324 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 M86.672 Other chronic osteomyelitis, left ankle and foot E11.621 Type 2  diabetes mellitus with foot ulcer E11.610 Type 2 diabetes mellitus with diabetic neuropathic arthropathy Quantity: 1 : [id number] - WC PHYS DEBR WO ANESTH 20 SQ CM ICD-10 Diagnosis Description L97.324 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

## 2022-01-21 NOTE — Progress Notes (Addendum)
DANZIG, MACGREGOR ([id number]) Visit Report for 01/21/2022 Arrival Information Details Patient Name: Date of Service: NA [name] 01/21/2022 12:45 PM Medical Record Number: [id number] Patient Account Number: [id number] Date of Birth/Sex: Treating RN: 03-Apr-1956 (66 y.o. [name] Primary Care Sherril Shipman: [name] Other Clinician: Referring Bawi Lakins: Treating Kelvyn Schunk/Extender: [name] in Treatment: 21 Visit Information History Since Last Visit Added or deleted any medications: No Patient Arrived: Knee Scooter Any new allergies or adverse reactions: No Arrival Time: 12:53 Had a fall or experienced change in No Accompanied By: self activities of daily living that may affect Transfer Assistance: None risk of falls: Patient Identification Verified: Yes Signs or symptoms of abuse/neglect since last visito No Secondary Verification Process Completed: Yes Hospitalized since last visit: No Patient Requires Transmission-Based Precautions: No Implantable device outside of the clinic excluding No Patient Has Alerts: Yes cellular tissue based products placed in the center Patient Alerts: Patient on Blood Thinner since last visit: Has Dressing in Place as Prescribed: Yes Pain Present Now: No Electronic Signature(s) Signed: 01/21/2022 4:07:48 PM By: Samuella Bruin Entered By: Samuella Bruin on 01/21/2022 12:53:26 -------------------------------------------------------------------------------- Encounter Discharge Information Details Patient Name: Date of Service: NA Colin Nephew A. 01/21/2022 12:45 PM Medical Record Number: [id number] Patient Account Number: [id number] Date of Birth/Sex: Treating RN: 01-Aug-1955 (66 y.o. [name] Primary Care Salvator Seppala: [name] Other Clinician: Referring Linzee Depaul: Treating Lilyonna Steidle/Extender: [name] in Treatment: 21 Encounter Discharge Information Items Post  Procedure Vitals Discharge Condition: Stable Temperature (F): 98.5 Ambulatory Status: Knee Scooter Pulse (bpm): 72 Discharge Destination: Home Respiratory Rate (breaths/min): 16 Transportation: Private Auto Blood Pressure (mmHg): 157/90 Accompanied By: self Schedule Follow-up Appointment: Yes Clinical Summary of Care: Patient Declined Electronic Signature(s) Signed: 01/21/2022 4:07:48 PM By: Samuella Bruin Entered By: Samuella Bruin on 01/21/2022 12:55:37 -------------------------------------------------------------------------------- Lower Extremity Assessment Details Patient Name: Date of Service: Colin Fanning A. 01/21/2022 12:45 PM Medical Record Number: [id number] Patient Account Number: [id number] Date of Birth/Sex: Treating RN: 31-Jan-1956 (66 y.o. [name] Primary Care Kinser Fellman: [name] Other Clinician: Referring Vlada Uriostegui: Treating Namon Villarin/Extender: [name]: 21 Edema Assessment Assessed: [Left: No] [Right: No] Edema: [Left: Ye] [Right: s] Calf Left: Right: Point of Measurement: From Medial Instep 36.6 cm Ankle Left: Right: Point of Measurement: From Medial Instep 22.3 cm Electronic Signature(s) Signed: 01/21/2022 4:07:48 PM By: Samuella Bruin Entered By: Samuella Bruin on 01/21/2022 12:50:57 -------------------------------------------------------------------------------- Multi Wound Chart Details Patient Name: Date of Service: Colin Fanning A. 01/21/2022 12:45 PM Medical Record Number: [id number] Patient Account Number: [id number] Date of Birth/Sex: Treating RN: 21-Aug-1955 (66 y.o. [name] Primary Care Lashya Passe: [name] Other Clinician: Referring Manasi Dishon: Treating Prabhjot Maddux/Extender: [name]: 21 Vital Signs Height(in): 74 Capillary Blood Glucose(mg/dl): 270 Weight(lbs): 350 Pulse(bpm): 72 Body Mass Index(BMI): 23.9 Blood  Pressure(mmHg): 157/90 Temperature(F): 98.5 Respiratory Rate(breaths/min): 16 Wound Assessments Treatment Notes Electronic Signature(s) Signed: 01/21/2022 12:36:51 PM By: [name] MD FACS Signed: 01/21/2022 4:07:48 PM By: Samuella Bruin Entered By: [name] on 01/21/2022 12:36:50 -------------------------------------------------------------------------------- Multi-Disciplinary Care Plan Details Patient Name: Date of Service: Colin Fanning A. 01/21/2022 12:45 PM Medical Record Number: [id number] Patient Account Number: [id number] Date of Birth/Sex: Treating RN: March 20, 1956 (66 y.o. [name] Primary Care Raejean Swinford: [name] Other Clinician: Referring Chonte [name]: Treating Nnaemeka Samson/Extender: [name] in Treatment: 21 Multidisciplinary Care Plan reviewed with physician Active  Inactive HBO Nursing Diagnoses: Anxiety related to feelings of confinement associated with the hyperbaric oxygen chamber Anxiety related to knowledge deficit of hyperbaric oxygen therapy and treatment procedures Discomfort related to temperature and humidity changes inside hyperbaric chamber Potential for barotraumas to ears, sinuses, teeth, and lungs or cerebral gas embolism related to changes in atmospheric pressure inside hyperbaric oxygen chamber Potential for oxygen toxicity seizures related to delivery of 100% oxygen at an increased atmospheric pressure Potential for pulmonary oxygen toxicity related to delivery of 100% oxygen at an increased atmospheric pressure Goals: Barotrauma will be prevented during HBO2 Date Initiated: 09/17/2021 T arget Resolution Date: 02/11/2022 Goal Status: Active Patient will tolerate the hyperbaric oxygen therapy treatment Date Initiated: 09/17/2021 T arget Resolution Date: 02/11/2022 Goal Status: Active Patient will tolerate the internal climate of the chamber Date Initiated: 09/17/2021 T arget Resolution Date:  02/11/2022 Goal Status: Active Patient/caregiver will verbalize understanding of HBO goals, rationale, procedures and potential hazards Date Initiated: 09/17/2021 T arget Resolution Date: 02/11/2022 Goal Status: Active Signs and symptoms of pulmonary oxygen toxicity will be recognized and promptly addressed Date Initiated: 09/17/2021 T arget Resolution Date: 02/11/2022 Goal Status: Active Signs and symptoms of seizure will be recognized and promptly addressed ; seizing patients will suffer no harm Date Initiated: 09/17/2021 T arget Resolution Date: 02/11/2022 Goal Status: Active Interventions: Administer decongestants, per physician orders, prior to HBO2 Administer the correct therapeutic gas delivery based on the patients needs and limitations, per physician order Assess and provide for patients comfort related to the hyperbaric environment and equalization of middle ear Assess for signs and symptoms related to adverse events, including but not limited to confinement anxiety, pneumothorax, oxygen toxicity and baurotrauma Assess patient for any history of confinement anxiety Assess patient's knowledge and expectations regarding hyperbaric medicine and provide education related to the hyperbaric environment, goals of treatment and prevention of adverse events Implement protocols to decrease risk of pneumothorax in high risk patients Notes: Nutrition Nursing Diagnoses: Impaired glucose control: actual or potential Goals: Patient/caregiver verbalizes understanding of need to maintain therapeutic glucose control per primary care physician Date Initiated: 08/25/2021 Target Resolution Date: 02/11/2022 Goal Status: Active Interventions: Assess HgA1c results as ordered upon admission and as needed Provide education on elevated blood sugars and impact on wound healing Notes: Osteomyelitis Nursing Diagnoses: Infection: osteomyelitis Knowledge deficit related to disease process and  management Goals: Patient's osteomyelitis will resolve Date Initiated: 09/17/2021 Target Resolution Date: 02/11/2022 Goal Status: Active Interventions: Assess for signs and symptoms of osteomyelitis resolution every visit Provide education on osteomyelitis Treatment Activities: Surgical debridement : 09/17/2021 Systemic antibiotics : 09/17/2021 T ordered outside of clinic : 09/17/2021 est Notes: Electronic Signature(s) Signed: 01/21/2022 4:07:48 PM By: Samuella Bruin Entered By: Samuella Bruin on 01/21/2022 12:53:36 -------------------------------------------------------------------------------- Pain Assessment Details Patient Name: Date of Service: Colin Fanning A. 01/21/2022 12:45 PM Medical Record Number: [id number] Patient Account Number: [id number] Date of Birth/Sex: Treating RN: 09/22/1955 (66 y.o. [name] Primary Care Nesha Counihan: [name] Other Clinician: Referring Aaren Atallah: Treating Bram Hottel/Extender: [name]: 21 Active Problems Location of Pain Severity and Description of Pain Patient Has Paino No Site Locations Rate the pain. Current Pain Level: 0 Pain Management and Medication Current Pain Management: Electronic Signature(s) Signed: 01/21/2022 4:07:48 PM By: Samuella Bruin Entered By: Samuella Bruin on 01/21/2022 12:50:52 -------------------------------------------------------------------------------- Patient/Caregiver Education Details Patient Name: Date of Service: NA Colin Nephew A. 8/11/2023andnbsp12:45 PM Medical Record Number: [id number] Patient Account Number: [id number] Date of Birth/Gender: Treating  RN: 1956/06/05 (66 y.o. [name] Primary Care Physician: [name] Other Clinician: Referring Physician: Treating Physician/Extender: [name] in Treatment: 21 Education Assessment Education Provided To: Patient Education Topics  Provided Wound/Skin Impairment: Methods: Explain/Verbal Responses: Reinforcements needed, State content correctly Electronic Signature(s) Signed: 01/21/2022 4:07:48 PM By: Samuella Bruin Entered By: Samuella Bruin on 01/21/2022 12:53:48 -------------------------------------------------------------------------------- Wound Assessment Details Patient Name: Date of Service: Colin Fanning A. 01/21/2022 12:45 PM Medical Record Number: [id number] Patient Account Number: [id number] Date of Birth/Sex: Treating RN: April 25, 1956 (66 y.o. [name] Primary Care Jlyn Cerros: [name] Other Clinician: Referring Devario Bucklew: Treating Kyndall Amero/Extender: [name]: 21 Wound Status Wound Number: 1 Primary Diabetic Wound/Ulcer of the Lower Extremity Etiology: Wound Location: Left, Medial Foot Wound Open Wounding Event: Gradually Appeared Status: Date Acquired: 02/22/2021 Comorbid Cataracts, Coronary Artery Disease, Hypertension, Myocardial Weeks Of Treatment: 21 History: Infarction, Peripheral Arterial Disease, Type II Diabetes, Clustered Wound: No Osteomyelitis, Neuropathy Wound Measurements Length: (cm) 3.7 Width: (cm) 2.5 Depth: (cm) 0.3 Area: (cm) 7.265 Volume: (cm) 2.179 % Reduction in Area: 48.6% % Reduction in Volume: 84.6% Epithelialization: Small (1-33%) Tunneling: Yes Position (o'clock): 1 Maximum Distance: (cm) 1.4 Undermining: No Wound Description Classification: Grade 3 Wound Margin: Distinct, outline attached Exudate Amount: Medium Exudate Type: Serosanguineous Exudate Color: red, brown Wound Bed Granulation Amount: Large (67-100%) Granulation Quality: Red, Hyper-granulation Necrotic Amount: Small (1-33%) Necrotic Quality: Adherent Slough Foul Odor After Cleansing: No Slough/Fibrino Yes Exposed Structure Fascia Exposed: No Fat Layer (Subcutaneous Tissue) Exposed: Yes Tendon Exposed: No Muscle Exposed:  No Joint Exposed: No Bone Exposed: No Treatment Notes Wound #1 (Foot) Wound Laterality: Left, Medial Cleanser Wound Cleanser Discharge Instruction: Cleanse the wound with wound cleanser prior to applying a clean dressing using gauze sponges, not tissue or cotton balls. Peri-Wound Care Zinc Oxide Ointment 30g tube Discharge Instruction: Apply Zinc Oxide to periwound with each dressing change Topical Primary Dressing Promogran Prisma Matrix, 4.34 (sq in) (silver collagen) Discharge Instruction: Moisten collagen with saline or hydrogel Secondary Dressing ABD Pad, 5x9 Discharge Instruction: In clinic. Home Health to apply wound vac. Woven Gauze Sponge, Non-Sterile 4x4 in Discharge Instruction: In clinic. Home Health to apply wound vac. Secured With [employer], 4.5x3.1 (in/yd) Discharge Instruction: In clinic. Home Health to apply wound vac. 34M Medipore H Soft Cloth Surgical T ape, 4 x 10 (in/yd) Discharge Instruction: In clinic. Home Health to apply wound vac. Compression Wrap Compression Stockings Add-Ons Electronic Signature(s) Signed: 01/21/2022 4:07:48 PM By: Samuella Bruin Entered By: Samuella Bruin on 01/21/2022 12:52:18 -------------------------------------------------------------------------------- Vitals Details Patient Name: Date of Service: NA Colin Colin Halsted A. 01/21/2022 12:45 PM Medical Record Number: [id number] Patient Account Number: [id number] Date of Birth/Sex: Treating RN: [birth year] (66 y.o. [name] Primary Care Prapti Grussing: [name] Other Clinician: [name] Referring Noraa Pickeral: Treating Jalin Erpelding/Extender: [name]: 21 Vital Signs Time Taken: 11:09 Temperature (F): 98.5 Height (in): 74 Pulse (bpm): 72 Weight (lbs): 186 Respiratory Rate (breaths/min): 16 Body Mass Index (BMI): 23.9 Blood Pressure (mmHg): 157/90 Capillary Blood Glucose (mg/dl): 144 Reference Range: 80 - 120  mg / dl Electronic Signature(s) Signed: 01/21/2022 11:12:51 AM By: [name] CHT EMT BS , , Entered By: [name] on 01/21/2022 11:12:51

## 2022-01-22 DIAGNOSIS — T8131XA Disruption of external operation (surgical) wound, not elsewhere classified, initial encounter: Secondary | ICD-10-CM | POA: Diagnosis not present

## 2022-01-23 DIAGNOSIS — T8131XA Disruption of external operation (surgical) wound, not elsewhere classified, initial encounter: Secondary | ICD-10-CM | POA: Diagnosis not present

## 2022-01-24 ENCOUNTER — Encounter (HOSPITAL_BASED_OUTPATIENT_CLINIC_OR_DEPARTMENT_OTHER): Payer: BC Managed Care – PPO | Admitting: General Surgery

## 2022-01-24 DIAGNOSIS — E1151 Type 2 diabetes mellitus with diabetic peripheral angiopathy without gangrene: Secondary | ICD-10-CM | POA: Diagnosis not present

## 2022-01-24 DIAGNOSIS — L97322 Non-pressure chronic ulcer of left ankle with fat layer exposed: Secondary | ICD-10-CM | POA: Diagnosis not present

## 2022-01-24 DIAGNOSIS — E11621 Type 2 diabetes mellitus with foot ulcer: Secondary | ICD-10-CM | POA: Diagnosis not present

## 2022-01-24 DIAGNOSIS — Z794 Long term (current) use of insulin: Secondary | ICD-10-CM | POA: Diagnosis not present

## 2022-01-24 DIAGNOSIS — E785 Hyperlipidemia, unspecified: Secondary | ICD-10-CM | POA: Diagnosis not present

## 2022-01-24 DIAGNOSIS — Z7984 Long term (current) use of oral hypoglycemic drugs: Secondary | ICD-10-CM | POA: Diagnosis not present

## 2022-01-24 DIAGNOSIS — E11622 Type 2 diabetes mellitus with other skin ulcer: Secondary | ICD-10-CM | POA: Diagnosis not present

## 2022-01-24 DIAGNOSIS — I252 Old myocardial infarction: Secondary | ICD-10-CM | POA: Diagnosis not present

## 2022-01-24 DIAGNOSIS — Z951 Presence of aortocoronary bypass graft: Secondary | ICD-10-CM | POA: Diagnosis not present

## 2022-01-24 DIAGNOSIS — Z7982 Long term (current) use of aspirin: Secondary | ICD-10-CM | POA: Diagnosis not present

## 2022-01-24 DIAGNOSIS — T8131XA Disruption of external operation (surgical) wound, not elsewhere classified, initial encounter: Secondary | ICD-10-CM | POA: Diagnosis not present

## 2022-01-24 DIAGNOSIS — K219 Gastro-esophageal reflux disease without esophagitis: Secondary | ICD-10-CM | POA: Diagnosis not present

## 2022-01-24 DIAGNOSIS — Z9181 History of falling: Secondary | ICD-10-CM | POA: Diagnosis not present

## 2022-01-24 DIAGNOSIS — I10 Essential (primary) hypertension: Secondary | ICD-10-CM | POA: Diagnosis not present

## 2022-01-24 DIAGNOSIS — D649 Anemia, unspecified: Secondary | ICD-10-CM | POA: Diagnosis not present

## 2022-01-24 DIAGNOSIS — M86672 Other chronic osteomyelitis, left ankle and foot: Secondary | ICD-10-CM | POA: Diagnosis not present

## 2022-01-24 DIAGNOSIS — E11610 Type 2 diabetes mellitus with diabetic neuropathic arthropathy: Secondary | ICD-10-CM | POA: Diagnosis not present

## 2022-01-24 DIAGNOSIS — M1990 Unspecified osteoarthritis, unspecified site: Secondary | ICD-10-CM | POA: Diagnosis not present

## 2022-01-24 DIAGNOSIS — L97324 Non-pressure chronic ulcer of left ankle with necrosis of bone: Secondary | ICD-10-CM | POA: Diagnosis not present

## 2022-01-24 DIAGNOSIS — I2510 Atherosclerotic heart disease of native coronary artery without angina pectoris: Secondary | ICD-10-CM | POA: Diagnosis not present

## 2022-01-24 DIAGNOSIS — E1140 Type 2 diabetes mellitus with diabetic neuropathy, unspecified: Secondary | ICD-10-CM | POA: Diagnosis not present

## 2022-01-24 LAB — GLUCOSE, CAPILLARY
Glucose-Capillary: 205 mg/dL — ABNORMAL HIGH (ref 70–99)
Glucose-Capillary: 195 mg/dL — ABNORMAL HIGH (ref 70–99)

## 2022-01-25 ENCOUNTER — Encounter (HOSPITAL_BASED_OUTPATIENT_CLINIC_OR_DEPARTMENT_OTHER): Payer: BC Managed Care – PPO | Admitting: General Surgery

## 2022-01-25 DIAGNOSIS — L97324 Non-pressure chronic ulcer of left ankle with necrosis of bone: Secondary | ICD-10-CM | POA: Diagnosis not present

## 2022-01-25 DIAGNOSIS — E11610 Type 2 diabetes mellitus with diabetic neuropathic arthropathy: Secondary | ICD-10-CM | POA: Diagnosis not present

## 2022-01-25 DIAGNOSIS — E1151 Type 2 diabetes mellitus with diabetic peripheral angiopathy without gangrene: Secondary | ICD-10-CM | POA: Diagnosis not present

## 2022-01-25 DIAGNOSIS — Z951 Presence of aortocoronary bypass graft: Secondary | ICD-10-CM | POA: Diagnosis not present

## 2022-01-25 DIAGNOSIS — M86672 Other chronic osteomyelitis, left ankle and foot: Secondary | ICD-10-CM | POA: Diagnosis not present

## 2022-01-25 DIAGNOSIS — T8131XA Disruption of external operation (surgical) wound, not elsewhere classified, initial encounter: Secondary | ICD-10-CM | POA: Diagnosis not present

## 2022-01-25 DIAGNOSIS — E1140 Type 2 diabetes mellitus with diabetic neuropathy, unspecified: Secondary | ICD-10-CM | POA: Diagnosis not present

## 2022-01-25 DIAGNOSIS — E11621 Type 2 diabetes mellitus with foot ulcer: Secondary | ICD-10-CM | POA: Diagnosis not present

## 2022-01-25 DIAGNOSIS — I2510 Atherosclerotic heart disease of native coronary artery without angina pectoris: Secondary | ICD-10-CM | POA: Diagnosis not present

## 2022-01-25 LAB — GLUCOSE, CAPILLARY
Glucose-Capillary: 155 mg/dL — ABNORMAL HIGH (ref 70–99)
Glucose-Capillary: 146 mg/dL — ABNORMAL HIGH (ref 70–99)

## 2022-01-26 ENCOUNTER — Encounter (HOSPITAL_BASED_OUTPATIENT_CLINIC_OR_DEPARTMENT_OTHER): Payer: BC Managed Care – PPO | Admitting: General Surgery

## 2022-01-26 DIAGNOSIS — Z951 Presence of aortocoronary bypass graft: Secondary | ICD-10-CM | POA: Diagnosis not present

## 2022-01-26 DIAGNOSIS — I252 Old myocardial infarction: Secondary | ICD-10-CM | POA: Diagnosis not present

## 2022-01-26 DIAGNOSIS — M86672 Other chronic osteomyelitis, left ankle and foot: Secondary | ICD-10-CM | POA: Diagnosis not present

## 2022-01-26 DIAGNOSIS — E11621 Type 2 diabetes mellitus with foot ulcer: Secondary | ICD-10-CM | POA: Diagnosis not present

## 2022-01-26 DIAGNOSIS — K219 Gastro-esophageal reflux disease without esophagitis: Secondary | ICD-10-CM | POA: Diagnosis not present

## 2022-01-26 DIAGNOSIS — M1990 Unspecified osteoarthritis, unspecified site: Secondary | ICD-10-CM | POA: Diagnosis not present

## 2022-01-26 DIAGNOSIS — E785 Hyperlipidemia, unspecified: Secondary | ICD-10-CM | POA: Diagnosis not present

## 2022-01-26 DIAGNOSIS — Z7982 Long term (current) use of aspirin: Secondary | ICD-10-CM | POA: Diagnosis not present

## 2022-01-26 DIAGNOSIS — L97324 Non-pressure chronic ulcer of left ankle with necrosis of bone: Secondary | ICD-10-CM | POA: Diagnosis not present

## 2022-01-26 DIAGNOSIS — I2510 Atherosclerotic heart disease of native coronary artery without angina pectoris: Secondary | ICD-10-CM | POA: Diagnosis not present

## 2022-01-26 DIAGNOSIS — Z9181 History of falling: Secondary | ICD-10-CM | POA: Diagnosis not present

## 2022-01-26 DIAGNOSIS — I10 Essential (primary) hypertension: Secondary | ICD-10-CM | POA: Diagnosis not present

## 2022-01-26 DIAGNOSIS — T8131XA Disruption of external operation (surgical) wound, not elsewhere classified, initial encounter: Secondary | ICD-10-CM | POA: Diagnosis not present

## 2022-01-26 DIAGNOSIS — Z794 Long term (current) use of insulin: Secondary | ICD-10-CM | POA: Diagnosis not present

## 2022-01-26 DIAGNOSIS — E11610 Type 2 diabetes mellitus with diabetic neuropathic arthropathy: Secondary | ICD-10-CM | POA: Diagnosis not present

## 2022-01-26 DIAGNOSIS — L97322 Non-pressure chronic ulcer of left ankle with fat layer exposed: Secondary | ICD-10-CM | POA: Diagnosis not present

## 2022-01-26 DIAGNOSIS — E11622 Type 2 diabetes mellitus with other skin ulcer: Secondary | ICD-10-CM | POA: Diagnosis not present

## 2022-01-26 DIAGNOSIS — E1140 Type 2 diabetes mellitus with diabetic neuropathy, unspecified: Secondary | ICD-10-CM | POA: Diagnosis not present

## 2022-01-26 DIAGNOSIS — E1151 Type 2 diabetes mellitus with diabetic peripheral angiopathy without gangrene: Secondary | ICD-10-CM | POA: Diagnosis not present

## 2022-01-26 DIAGNOSIS — Z7984 Long term (current) use of oral hypoglycemic drugs: Secondary | ICD-10-CM | POA: Diagnosis not present

## 2022-01-26 DIAGNOSIS — D649 Anemia, unspecified: Secondary | ICD-10-CM | POA: Diagnosis not present

## 2022-01-26 LAB — GLUCOSE, CAPILLARY
Glucose-Capillary: 182 mg/dL — ABNORMAL HIGH (ref 70–99)
Glucose-Capillary: 178 mg/dL — ABNORMAL HIGH (ref 70–99)

## 2022-01-26 NOTE — Progress Notes (Signed)
KAYDEN, HUTMACHER ([id number]) Visit Report for 01/26/2022 Problem List Details Patient Name: Date of Service: NA Colin Nephew A. 01/26/2022 8:00 A M Medical Record Number: [id number] Patient Account Number: [id number] Date of Birth/Sex: Treating RN: [birth year] (66 y.o. 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 01/26/2022 Medical Record Number: [id number] Patient Account Number: [id number] Date of Birth/Sex: Treating RN: February 10, 1956 (66 y.o. 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 Facility Procedures CPT4 Code: [id number] Description: G0277-(Facility Use Only) HBOT full body chamber, , ICD-10 Diagnosis Description E11.621 Type 2 diabetes mellitus with foot ulcer 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 M86.672 Other chronic  osteomyelitis, left ankle and foot 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

## 2022-01-26 NOTE — Progress Notes (Signed)
VINAY, ERTL ([id number]) Visit Report for 01/26/2022 Arrival Information Details Patient Name: Date of Service: NA [name] 01/26/2022 8:00 A M Medical Record Number: [id number] Patient Account Number: [id number] Date of Birth/Sex: Treating RN: [birth year] (66 y.o. [name] Primary Care Lyndon Chapel: [name] Other Clinician: [name] Referring Lamiya Naas: Treating Ronith Berti/Extender: [name] in Treatment: 22 Visit Information History Since Last Visit All ordered tests and consults were completed: Yes Patient Arrived: Knee Scooter Added or deleted any medications: No Arrival Time: 08:20 Any new allergies or adverse reactions: No Accompanied By: Wife Had a fall or experienced change in No Transfer Assistance: None activities of daily living that may affect Patient Identification Verified: Yes risk of falls: Secondary Verification Process Completed: Yes Signs or symptoms of abuse/neglect since last visito No Patient Requires Transmission-Based Precautions: No Hospitalized since last visit: No Patient Has Alerts: Yes Implantable device outside of the clinic excluding No Patient Alerts: Patient on Blood Thinner cellular tissue based products placed in the center since last visit: Pain Present Now: No Electronic Signature(s) Signed: 01/26/2022 9:11:16 AM By: [name] EMT Entered By: [name] on 01/26/2022 09:11:15 -------------------------------------------------------------------------------- Encounter Discharge Information Details Patient Name: Date of Service: NA Colin Colin Halsted A. 01/26/2022 8:00 A M Medical Record Number: [id number] Patient Account Number: [id number] Date of Birth/Sex: Treating RN: 09-29-1955 (66 y.o. [name] Primary Care Eamonn Sermeno: [name] Other Clinician: [name] Referring Emary Zalar: Treating Durelle Zepeda/Extender: [name]: 22 Encounter  Discharge Information Items Discharge Condition: Stable Ambulatory Status: Knee Scooter Discharge Destination: Home Transportation: Private Auto Accompanied By: None Schedule Follow-up Appointment: Yes Clinical Summary of Care: Electronic Signature(s) Signed: 01/26/2022 10:57:30 AM By: [name] EMT Entered By: [name] on 01/26/2022 10:57:30 -------------------------------------------------------------------------------- Vitals Details Patient Name: Date of Service: NA Colin [name], [name] 01/26/2022 8:00 A M Medical Record Number: [id number] Patient Account Number: [id number] Date of Birth/Sex: Treating RN: 1955-11-17 (66 y.o. [name] Primary Care Dalexa Gentz: [name] Other Clinician: [name] Referring Norman Bier: Treating Jeraldine Primeau/Extender: [name]: 22 Vital Signs Time Taken: 08:34 Temperature (F): 97.3 Height (in): 74 Pulse (bpm): 69 Weight (lbs): 186 Respiratory Rate (breaths/min): 16 Body Mass Index (BMI): 23.9 Blood Pressure (mmHg): 142/78 Capillary Blood Glucose (mg/dl): 270 Reference Range: 80 - 120 mg / dl Electronic Signature(s) Signed: 01/26/2022 9:11:47 AM By: [name] EMT Entered By: [name] on 01/26/2022 09:11:47

## 2022-01-26 NOTE — Progress Notes (Signed)
[name], [name] ([id number]) Visit Report for 01/26/2022 HBO Details Patient Name: Date of Service: NA [name] 01/26/2022 8:00 A M Medical Record Number: [id number] Patient Account Number: [id number] Date of Birth/Sex: Treating RN: 1956/04/01 (66 y.o. [name] Primary Care [name]: [name] Other Clinician: [name] Referring [name]: Treating Merri Dimaano/Extender: [name]: 22 HBO Treatment Course Details Treatment Course Number: 1 Ordering Rhylin Venters: [name] T Treatments Ordered: otal 120 HBO Treatment Start Date: 09/20/2021 HBO Indication: Diabetic Ulcer(s) of the Lower Extremity Standard/Conservative Wound Care tried and failed greater than or equal to 30 days HBO Treatment Details Treatment Number: 84 Patient Type: Outpatient Chamber Type: Monoplace Chamber Serial #: [id number] Treatment Protocol: 2.0 ATA with 90 minutes oxygen, with two 5 minute air breaks Treatment Details Compression Rate Down: 2.0 psi / minute De-Compression Rate Up: 2.0 psi / minute A breaks and breathing ir Compress Tx Pressure periods Decompress Decompress Begins Reached (leave unused spaces Begins Ends blank) Chamber Pressure (ATA 1 2 2 2 2 2  --2 1 ) Clock Time (24 hr) 08:37 08:48 09:18 09:23 09:53 09:58 - - 10:28 10:37 Treatment Length: 120 (minutes) Treatment Segments: 4 Vital Signs Capillary Blood Glucose Reference Range: 80 - 120 mg / dl HBO Diabetic Blood Glucose Intervention Range: <131 mg/dl or mg/dl Time Vitals Blood Respiratory Capillary Blood Glucose Pulse Action Type: Pulse: Temperature: Taken: Pressure: Rate: Glucose (mg/dl): Meter #: Oximetry (%) Taken: Pre 08:34 142/78 69 16 97.3 182 Post 10:41 158/78 64 16 97.3 178 Treatment Response Treatment Toleration: Well Treatment Completion Status: Treatment Completed without Adverse Event Additional Procedure Documentation Tissue Sevierity: Necrosis  of bone Physician HBO Attestation: I certify that I supervised this HBO treatment in accordance with Medicare guidelines. A trained emergency response team is readily available per Yes hospital policies and procedures. Continue HBOT as ordered. Yes Electronic Signature(s) Signed: 01/26/2022 12:41:40 PM By: 01/28/2022 MD FACS Previous Signature: 01/26/2022 10:56:17 AM Version By: 01/28/2022 EMT Entered By: [name] on 01/26/2022 12:41:39 -------------------------------------------------------------------------------- HBO Safety Checklist Details Patient Name: Date of Service: 01/28/2022 [name] 01/26/2022 8:00 A M Medical Record Number: 01/28/2022 Patient Account Number: [id number] Date of Birth/Sex: Treating RN: Feb 06, 1956 (66 y.o. 79 Primary Care [name]: [name] Other Clinician: [name] Referring [name]: Treating [name]/Extender: [name]: 22 HBO Safety Checklist Items Safety Checklist Consent Form Signed Patient voided / foley secured and emptied When did you last eato 0645 Last dose of injectable or oral agent 0645 Ostomy pouch emptied and vented if applicable NA All implantable devices assessed, documented and approved NA Intravenous access site secured and place NA Valuables secured Linens and cotton and cotton/polyester blend (less than 51% polyester) Personal oil-based products / skin lotions / body lotions removed Wigs or hairpieces removed NA Smoking or tobacco materials removed Books / newspapers / magazines / loose paper removed Cologne, aftershave, perfume and deodorant removed Jewelry removed (may wrap wedding band) NA Make-up removed NA Hair care products removed Battery operated devices (external) removed Heating patches and chemical warmers removed Titanium eyewear removed NA Nail polish cured greater than 10 hours NA Casting material cured greater than 10  hours NA Hearing aids removed NA Loose dentures or partials removed NA Prosthetics have been removed NA Patient demonstrates correct use of air break device (if applicable) Patient concerns have been addressed Patient grounding bracelet on and cord attached to chamber Specifics for Inpatients (complete  in addition to above) Medication sheet sent with patient NA Intravenous medications needed or due during therapy sent with patient NA Drainage tubes (e.g. nasogastric tube or chest tube secured and vented) NA Endotracheal or Tracheotomy tube secured NA Cuff deflated of air and inflated with saline NA Airway suctioned NA Notes The safety checklist was done before the treatment was started. Electronic Signature(s) Signed: 01/26/2022 9:13:17 AM By: [name] EMT Entered By: [name] on 01/26/2022 09:13:17

## 2022-01-27 ENCOUNTER — Encounter (HOSPITAL_BASED_OUTPATIENT_CLINIC_OR_DEPARTMENT_OTHER): Payer: BC Managed Care – PPO | Admitting: General Surgery

## 2022-01-27 DIAGNOSIS — L97324 Non-pressure chronic ulcer of left ankle with necrosis of bone: Secondary | ICD-10-CM | POA: Diagnosis not present

## 2022-01-27 DIAGNOSIS — T8131XA Disruption of external operation (surgical) wound, not elsewhere classified, initial encounter: Secondary | ICD-10-CM | POA: Diagnosis not present

## 2022-01-27 DIAGNOSIS — E11621 Type 2 diabetes mellitus with foot ulcer: Secondary | ICD-10-CM | POA: Diagnosis not present

## 2022-01-27 DIAGNOSIS — E11610 Type 2 diabetes mellitus with diabetic neuropathic arthropathy: Secondary | ICD-10-CM | POA: Diagnosis not present

## 2022-01-27 DIAGNOSIS — Z951 Presence of aortocoronary bypass graft: Secondary | ICD-10-CM | POA: Diagnosis not present

## 2022-01-27 DIAGNOSIS — E1151 Type 2 diabetes mellitus with diabetic peripheral angiopathy without gangrene: Secondary | ICD-10-CM | POA: Diagnosis not present

## 2022-01-27 DIAGNOSIS — E1140 Type 2 diabetes mellitus with diabetic neuropathy, unspecified: Secondary | ICD-10-CM | POA: Diagnosis not present

## 2022-01-27 DIAGNOSIS — M86672 Other chronic osteomyelitis, left ankle and foot: Secondary | ICD-10-CM | POA: Diagnosis not present

## 2022-01-27 DIAGNOSIS — I2510 Atherosclerotic heart disease of native coronary artery without angina pectoris: Secondary | ICD-10-CM | POA: Diagnosis not present

## 2022-01-27 LAB — GLUCOSE, CAPILLARY
Glucose-Capillary: 194 mg/dL — ABNORMAL HIGH (ref 70–99)
Glucose-Capillary: 231 mg/dL — ABNORMAL HIGH (ref 70–99)

## 2022-01-27 NOTE — Progress Notes (Addendum)
TAISEI, BONNETTE ([id number]) Visit Report for 01/27/2022 Arrival Information Details Patient Name: Date of Service: NA [name] 01/27/2022 8:00 A M Medical Record Number: [id number] Patient Account Number: [id number] Date of Birth/Sex: Treating RN: December 02, 1955 (66 y.o. [name] Primary Care Bexlee Bergdoll: [name] Other Clinician: [name] Referring Alaynna Kerwood: Treating Kendrea Cerritos/Extender: [name] in Treatment: 22 Visit Information History Since Last Visit All ordered tests and consults were completed: Yes Patient Arrived: Knee Scooter Added or deleted any medications: No Arrival Time: 08:09 Any new allergies or adverse reactions: No Accompanied By: Wife Had a fall or experienced change in No Transfer Assistance: None activities of daily living that may affect Patient Identification Verified: Yes risk of falls: Secondary Verification Process Completed: Yes Signs or symptoms of abuse/neglect since last visito No Patient Requires Transmission-Based Precautions: No Hospitalized since last visit: No Patient Has Alerts: Yes Implantable device outside of the clinic excluding No Patient Alerts: Patient on Blood Thinner cellular tissue based products placed in the center since last visit: Pain Present Now: No Electronic Signature(s) Signed: 01/27/2022 9:34:01 AM By: [name] EMT Entered By: [name] on 01/27/2022 09:34:01 -------------------------------------------------------------------------------- Encounter Discharge Information Details Patient Name: Date of Service: NA [name] 01/27/2022 8:00 A M Medical Record Number: [id number] Patient Account Number: [id number] Date of Birth/Sex: Treating RN: Apr 25, 1956 (66 y.o. [name] Primary Care Keijuan Schellhase: [name] Other Clinician: [name] Referring Riley Papin: Treating Opaline Reyburn/Extender: [name]: 22 Encounter  Discharge Information Items Discharge Condition: Stable Ambulatory Status: Knee Scooter Discharge Destination: Home Transportation: Private Auto Accompanied By: Wife Schedule Follow-up Appointment: Yes Clinical Summary of Care: Electronic Signature(s) Signed: 01/27/2022 1:11:24 PM By: [name] EMT Entered By: [name] on 01/27/2022 13:11:23 -------------------------------------------------------------------------------- Vitals Details Patient Name: Date of Service: NA [name], [name] 01/27/2022 8:00 A M Medical Record Number: [id number] Patient Account Number: [id number] Date of Birth/Sex: Treating RN: Nov 07, 1955 (66 y.o. [name] Primary Care Suda Forbess: [name] Other Clinician: [name] Referring Johnelle Tafolla: Treating Karrington Mccravy/Extender: [name]: 22 Vital Signs Time Taken: 08:27 Temperature (F): 97.3 Height (in): 74 Pulse (bpm): 79 Weight (lbs): 186 Respiratory Rate (breaths/min): 16 Body Mass Index (BMI): 23.9 Blood Pressure (mmHg): 129/79 Capillary Blood Glucose (mg/dl): 532 Reference Range: 80 - 120 mg / dl Electronic Signature(s) Signed: 01/27/2022 9:34:32 AM By: [name] EMT Entered By: [name] on 01/27/2022 09:34:32

## 2022-01-27 NOTE — Progress Notes (Signed)
[name], [name] ([id number]) Visit Report for 01/27/2022 Problem List Details Patient Name: Date of Service: NA Colin Nephew A. 01/27/2022 8:00 A M Medical Record Number: [id number] Patient Account Number: [id number] Date of Birth/Sex: Treating RN: 01/12/1956 (66 y.o. 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 01/27/2022 Medical Record Number: [id number] Patient Account Number: [id number] Date of Birth/Sex: Treating RN: 01-01-1956 (66 y.o. 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 Facility Procedures CPT4 Code: [id number] Description: G0277-(Facility Use Only) HBOT full body chamber, , ICD-10 Diagnosis Description E11.621 Type 2 diabetes mellitus with foot ulcer 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 M86.672 Other chronic  osteomyelitis, left ankle and foot 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

## 2022-01-27 NOTE — Progress Notes (Addendum)
[name], [name] Visit Report for 01/27/2022 HBO Details Patient Name: Date of Service: NA [name] Nephew A. 01/27/2022 8:00 A M Medical Record Number: [id number] Patient Account Number: [id number] Date of Birth/Sex: Treating RN: 1955-09-10 (66 y.o. Marlan [name] Primary Care [name] [name]: [name] [name] Other Clinician: [name] Referring [name] [name]: Treating [name] [name]/Extender: [name] [name] Weeks in Treatment: 22 HBO Treatment Course Details Treatment Course Number: 1 Ordering [name] [name]: [name] [name] T Treatments Ordered: otal 120 HBO Treatment Start Date: 09/20/2021 HBO Indication: Diabetic Ulcer(s) of the Lower Extremity Standard/Conservative Wound Care tried and failed greater than or equal to 30 days HBO Treatment Details Treatment Number: 85 Patient Type: Outpatient Chamber Type: Monoplace Chamber Serial #: [id number] Treatment Protocol: 2.0 ATA with 90 minutes oxygen, with two 5 minute air breaks Treatment Details Compression Rate Down: 2.0 psi / minute De-Compression Rate Up: 2.0 psi / minute A breaks and breathing ir Compress Tx Pressure periods Decompress Decompress Begins Reached (leave unused spaces Begins Ends blank) Chamber Pressure (ATA 1 2 2 2 2 2  --2 1 ) Clock Time (24 hr) 08:28 08:40 09:10 09:16 09:46 09:51 - - 10:21 10:30 Treatment Length: 122 (minutes) Treatment Segments: 4 Vital Signs Capillary Blood Glucose Reference Range: 80 - 120 mg / dl HBO Diabetic Blood Glucose Intervention Range: <131 mg/dl or mg/dl Time Vitals Blood Respiratory Capillary Blood Glucose Pulse Action Type: Pulse: Temperature: Taken: Pressure: Rate: Glucose (mg/dl): Meter #: Oximetry (%) Taken: Pre 08:27 129/79 79 16 97.3 194 Post 10:33 149/86 71 16 98.1 231 Treatment Response Treatment Toleration: Well Treatment Completion Status: Treatment Completed without Adverse Event Additional Procedure Documentation Tissue Sevierity: Necrosis  of bone Physician HBO Attestation: I certify that I supervised this HBO treatment in accordance with Medicare guidelines. A trained emergency response team is readily available per Yes hospital policies and procedures. Continue HBOT as ordered. Yes Electronic Signature(s) Signed: 01/27/2022 4:47:16 PM By: 01/29/2022 MD FACS Previous Signature: 01/27/2022 1:10:08 PM Version By: 01/29/2022 EMT Previous Signature: 01/27/2022 9:36:32 AM Version By: 01/29/2022 EMT Entered By: [name] [name] on 01/27/2022 16:47:15 -------------------------------------------------------------------------------- HBO Safety Checklist Details Patient Name: Date of Service: NA 01/29/2022 [name] 01/27/2022 8:00 A M Medical Record Number: 01/29/2022 Patient Account Number: [id number] Date of Birth/Sex: Treating RN: 08/25/1955 (66 y.o. 79 Primary Care Mak Bonny: Marlan [name] Other Clinician: [name] Referring [name] [name]: Treating [name] [name]/Extender: [name] [name] Weeks in Treatment: 22 HBO Safety Checklist Items Safety Checklist Consent Form Signed Patient voided / foley secured and emptied When did you last eato 0645 Last dose of injectable or oral agent 0645 Ostomy pouch emptied and vented if applicable NA All implantable devices assessed, documented and approved NA Intravenous access site secured and place NA Valuables secured Linens and cotton and cotton/polyester blend (less than 51% polyester) Personal oil-based products / skin lotions / body lotions removed Wigs or hairpieces removed NA Smoking or tobacco materials removed Books / newspapers / magazines / loose paper removed Cologne, aftershave, perfume and deodorant removed Jewelry removed (may wrap wedding band) NA Make-up removed NA Hair care products removed Battery operated devices (external) removed Heating patches and chemical warmers removed Titanium eyewear removed NA Nail polish  cured greater than 10 hours NA Casting material cured greater than 10 hours NA Hearing aids removed NA Loose dentures or partials removed NA Prosthetics have been removed NA Patient demonstrates correct use of air break device (if applicable) Patient concerns have been addressed Patient grounding bracelet  on and cord attached to chamber Specifics for Inpatients (complete in addition to above) Medication sheet sent with patient NA Intravenous medications needed or due during therapy sent with patient NA Drainage tubes (e.g. nasogastric tube or chest tube secured and vented) NA Endotracheal or Tracheotomy tube secured NA Cuff deflated of air and inflated with saline NA Airway suctioned NA Notes The safety checklist was done before the treatment was started Electronic Signature(s) Signed: 01/27/2022 9:35:53 AM By: [name] [name] EMT Entered By: [name] [name] on 01/27/2022 09:35:52

## 2022-01-28 ENCOUNTER — Encounter (HOSPITAL_BASED_OUTPATIENT_CLINIC_OR_DEPARTMENT_OTHER): Payer: BC Managed Care – PPO | Admitting: General Surgery

## 2022-01-28 DIAGNOSIS — I252 Old myocardial infarction: Secondary | ICD-10-CM | POA: Diagnosis not present

## 2022-01-28 DIAGNOSIS — Z9181 History of falling: Secondary | ICD-10-CM | POA: Diagnosis not present

## 2022-01-28 DIAGNOSIS — M1990 Unspecified osteoarthritis, unspecified site: Secondary | ICD-10-CM | POA: Diagnosis not present

## 2022-01-28 DIAGNOSIS — M86672 Other chronic osteomyelitis, left ankle and foot: Secondary | ICD-10-CM | POA: Diagnosis not present

## 2022-01-28 DIAGNOSIS — L97322 Non-pressure chronic ulcer of left ankle with fat layer exposed: Secondary | ICD-10-CM | POA: Diagnosis not present

## 2022-01-28 DIAGNOSIS — Z951 Presence of aortocoronary bypass graft: Secondary | ICD-10-CM | POA: Diagnosis not present

## 2022-01-28 DIAGNOSIS — Z7984 Long term (current) use of oral hypoglycemic drugs: Secondary | ICD-10-CM | POA: Diagnosis not present

## 2022-01-28 DIAGNOSIS — E785 Hyperlipidemia, unspecified: Secondary | ICD-10-CM | POA: Diagnosis not present

## 2022-01-28 DIAGNOSIS — T8131XA Disruption of external operation (surgical) wound, not elsewhere classified, initial encounter: Secondary | ICD-10-CM | POA: Diagnosis not present

## 2022-01-28 DIAGNOSIS — Z794 Long term (current) use of insulin: Secondary | ICD-10-CM | POA: Diagnosis not present

## 2022-01-28 DIAGNOSIS — D649 Anemia, unspecified: Secondary | ICD-10-CM | POA: Diagnosis not present

## 2022-01-28 DIAGNOSIS — Z7982 Long term (current) use of aspirin: Secondary | ICD-10-CM | POA: Diagnosis not present

## 2022-01-28 DIAGNOSIS — I10 Essential (primary) hypertension: Secondary | ICD-10-CM | POA: Diagnosis not present

## 2022-01-28 DIAGNOSIS — E1151 Type 2 diabetes mellitus with diabetic peripheral angiopathy without gangrene: Secondary | ICD-10-CM | POA: Diagnosis not present

## 2022-01-28 DIAGNOSIS — E1140 Type 2 diabetes mellitus with diabetic neuropathy, unspecified: Secondary | ICD-10-CM | POA: Diagnosis not present

## 2022-01-28 DIAGNOSIS — E11622 Type 2 diabetes mellitus with other skin ulcer: Secondary | ICD-10-CM | POA: Diagnosis not present

## 2022-01-28 DIAGNOSIS — K219 Gastro-esophageal reflux disease without esophagitis: Secondary | ICD-10-CM | POA: Diagnosis not present

## 2022-01-28 DIAGNOSIS — L97324 Non-pressure chronic ulcer of left ankle with necrosis of bone: Secondary | ICD-10-CM | POA: Diagnosis not present

## 2022-01-28 DIAGNOSIS — I2510 Atherosclerotic heart disease of native coronary artery without angina pectoris: Secondary | ICD-10-CM | POA: Diagnosis not present

## 2022-01-28 DIAGNOSIS — E11610 Type 2 diabetes mellitus with diabetic neuropathic arthropathy: Secondary | ICD-10-CM | POA: Diagnosis not present

## 2022-01-28 DIAGNOSIS — E11621 Type 2 diabetes mellitus with foot ulcer: Secondary | ICD-10-CM | POA: Diagnosis not present

## 2022-01-28 LAB — GLUCOSE, CAPILLARY
Glucose-Capillary: 186 mg/dL — ABNORMAL HIGH (ref 70–99)
Glucose-Capillary: 125 mg/dL — ABNORMAL HIGH (ref 70–99)

## 2022-01-28 NOTE — Progress Notes (Signed)
MAGUIRE, SIME ([id number]) Visit Report for 01/25/2022 Arrival Information Details Patient Name: Date of Service: NA [name] 01/25/2022 8:00 A M Medical Record Number: [id number] Patient Account Number: [id number] Date of Birth/Sex: Treating RN: 05-14-1956 (66 y.o. [name] Primary Care Derik Fults: [name] Other Clinician: [name] Referring Gaylen Venning: Treating Johnte Portnoy/Extender: [name] in Treatment: 21 Visit Information History Since Last Visit All ordered tests and consults were completed: Yes Patient Arrived: Knee Scooter Added or deleted any medications: No Arrival Time: 08:17 Any new allergies or adverse reactions: No Accompanied By: None Had a fall or experienced change in No Transfer Assistance: None activities of daily living that may affect Patient Identification Verified: Yes risk of falls: Secondary Verification Process Completed: Yes Signs or symptoms of abuse/neglect since last visito No Patient Requires Transmission-Based Precautions: No Hospitalized since last visit: No Patient Has Alerts: Yes Implantable device outside of the clinic excluding No Patient Alerts: Patient on Blood Thinner cellular tissue based products placed in the center since last visit: Pain Present Now: No Electronic Signature(s) Signed: 01/25/2022 9:14:49 AM By: [name] EMT Entered By: [name] on 01/25/2022 09:14:49 -------------------------------------------------------------------------------- Encounter Discharge Information Details Patient Name: Date of Service: NA [name] 01/25/2022 8:00 A M Medical Record Number: [id number] Patient Account Number: [id number] Date of Birth/Sex: Treating RN: 1955-10-02 (66 y.o. [name] Primary Care Dakia Schifano: [name] Other Clinician: [name] Referring Thalya Fouche: Treating Nima Bamburg/Extender: [name] in Treatment: 21 Encounter  Discharge Information Items Discharge Condition: Stable Ambulatory Status: Knee Scooter Discharge Destination: Home Transportation: Private Auto Accompanied By: None Schedule Follow-up Appointment: Yes Clinical Summary of Care: Electronic Signature(s) Signed: 01/25/2022 11:42:10 AM By: [name] EMT Entered By: [name] on 01/25/2022 11:42:09 -------------------------------------------------------------------------------- Vitals Details Patient Name: Date of Service: NA [name], [name] 01/25/2022 8:00 A M Medical Record Number: [id number] Patient Account Number: [id number] Date of Birth/Sex: Treating RN: [birth year] (66 y.o. [name] Primary Care Goebel Hellums: [name] Other Clinician: [name] Referring Daivion Pape: Treating Barth Trella/Extender: [name]: 21 Vital Signs Time Taken: 08:28 Temperature (F): 97.7 Height (in): 74 Pulse (bpm): 78 Weight (lbs): 186 Respiratory Rate (breaths/min): 16 Body Mass Index (BMI): 23.9 Blood Pressure (mmHg): 129/77 Capillary Blood Glucose (mg/dl): 211 Reference Range: 80 - 120 mg / dl Electronic Signature(s) Signed: 01/25/2022 9:15:14 AM By: [name] EMT Entered By: [name] on 01/25/2022 09:15:14

## 2022-01-28 NOTE — Progress Notes (Signed)
[name], [name] ([id number]) Visit Report for 01/24/2022 Arrival Information Details Patient Name: Date of Service: NA [name]. 01/24/2022 8:00 [name] Medical Record Number: [id number] Patient Account Number: [id number] Date of Birth/Sex: Treating RN: [birth year] (66 y.o. [name] Primary Care Deshae Dickison: [name] Other Clinician: [name] Referring Amalie Koran: Treating Wilver Tignor/Extender: [name] in Treatment: 21 Visit Information History Since Last Visit All ordered tests and consults were completed: Yes Patient Arrived: Knee Scooter Added or deleted any medications: No Arrival Time: 08:00 Any new allergies or adverse reactions: No Accompanied By: Wife Had [name] fall or experienced change in No Transfer Assistance: None activities of daily living that may affect Patient Identification Verified: Yes risk of falls: Secondary Verification Process Completed: Yes Signs or symptoms of abuse/neglect since last visito No Patient Requires Transmission-Based Precautions: No Hospitalized since last visit: No Patient Has Alerts: Yes Implantable device outside of the clinic excluding No Patient Alerts: Patient on Blood Thinner cellular tissue based products placed in the center since last visit: Pain Present Now: No Notes The safety checklist was done before the treatment was started. Electronic Signature(s) Signed: 01/24/2022 10:47:46 AM By: [name] EMT Entered By: [name] on 01/24/2022 10:47:45 -------------------------------------------------------------------------------- Encounter Discharge Information Details Patient Name: Date of Service: NA [name]. 01/24/2022 8:00 [name] Medical Record Number: [id number] Patient Account Number: [id number] Date of Birth/Sex: Treating RN: May 20, 1956 (66 y.o. [name] Primary Care Karilynn Carranza: [name] Other Clinician: [name] Referring Davy Westmoreland: Treating Irvine Glorioso/Extender:  [name] in Treatment: 21 Encounter Discharge Information Items Discharge Condition: Stable Ambulatory Status: Knee Scooter Discharge Destination: Home Transportation: Private Auto Accompanied By: Wife Schedule Follow-up Appointment: Yes Clinical Summary of Care: Electronic Signature(s) Signed: 01/24/2022 10:56:24 AM By: [name] EMT Entered By: [name] on 01/24/2022 10:56:24 -------------------------------------------------------------------------------- Vitals Details Patient Name: Date of Service: NA [name], [name]. 01/24/2022 8:00 [name] Medical Record Number: [id number] Patient Account Number: [id number] Date of Birth/Sex: Treating RN: Oct 31, 1955 (66 y.o. [name], [name] Primary Care Honestii Marton: [name] Other Clinician: [name] Referring Vernestine Brodhead: Treating Lelend Heinecke/Extender: [name]: 21 Vital Signs Time Taken: 08:18 Temperature (F): 97.7 Height (in): 74 Pulse (bpm): 79 Weight (lbs): 186 Respiratory Rate (breaths/min): 18 Body Mass Index (BMI): 23.9 Blood Pressure (mmHg): 124/83 Capillary Blood Glucose (mg/dl): 875 Reference Range: 80 - 120 mg / dl Electronic Signature(s) Signed: 01/24/2022 10:48:30 AM By: [name] EMT Entered By: [name] on 01/24/2022 10:48:30

## 2022-01-28 NOTE — Progress Notes (Signed)
[name], [name] ([id number]) Visit Report for 01/28/2022 Arrival Information Details Patient Name: Date of Service: NA [name] 01/28/2022 10:00 A M Medical Record Number: [id number] Patient Account Number: [id number] Date of Birth/Sex: Treating RN: [birth year] (66 y.o. [name] Primary Care Darris Carachure: [name] Other Clinician: [name] Referring Marvette Schamp: Treating Calum Cormier/Extender: [name] in Treatment: 22 Visit Information History Since Last Visit All ordered tests and consults were completed: Yes Patient Arrived: Knee Scooter Added or deleted any medications: No Arrival Time: 11:07 Any new allergies or adverse reactions: No Accompanied By: self Had a fall or experienced change in No Transfer Assistance: None activities of daily living that may affect Patient Identification Verified: Yes risk of falls: Secondary Verification Process Completed: Yes Signs or symptoms of abuse/neglect since last visito No Patient Requires Transmission-Based Precautions: No Hospitalized since last visit: No Patient Has Alerts: Yes Implantable device outside of the clinic excluding No Patient Alerts: Patient on Blood Thinner cellular tissue based products placed in the center since last visit: Pain Present Now: No Electronic Signature(s) Signed: 01/28/2022 8:07:28 PM By: [name] CHT EMT BS , , Entered By: [name] on 01/28/2022 11:08:11 -------------------------------------------------------------------------------- Encounter Discharge Information Details Patient Name: Date of Service: [name] A. 01/28/2022 10:00 A M Medical Record Number: [id number] Patient Account Number: [id number] Date of Birth/Sex: Treating RN: 02-Apr-1956 (66 y.o. [name] Primary Care Niklaus Mamaril: [name] Other Clinician: Referring Monico Sudduth: Treating Jessenya Berdan/Extender: [name] in Treatment:  22 Encounter Discharge Information Items Post Procedure Vitals Discharge Condition: Stable Temperature (F): 97.2 Ambulatory Status: Knee Scooter Pulse (bpm): 68 Discharge Destination: Home Respiratory Rate (breaths/min): 16 Transportation: Private Auto Blood Pressure (mmHg): 149/89 Accompanied By: self Schedule Follow-up Appointment: Yes Clinical Summary of Care: Patient Declined Electronic Signature(s) Signed: 01/28/2022 4:00:02 PM By: Samuella Bruin Entered By: Samuella Bruin on 01/28/2022 11:59:15 -------------------------------------------------------------------------------- Lower Extremity Assessment Details Patient Name: Date of Service: [name] A. 01/28/2022 10:00 A M Medical Record Number: [id number] Patient Account Number: [id number] Date of Birth/Sex: Treating RN: [birth year] (66 y.o. [name] Primary Care Athaliah Baumbach: [name] Other Clinician: Referring Boyde Grieco: Treating Nakeitha Milligan/Extender: [name]: 22 Edema Assessment Assessed: [Left: No] [Right: No] Edema: [Left: Ye] [Right: s] Calf Left: Right: Point of Measurement: From Medial Instep 37.8 cm Ankle Left: Right: Point of Measurement: From Medial Instep 23.9 cm Vascular Assessment Pulses: Dorsalis Pedis Palpable: [Left:Yes] Electronic Signature(s) Signed: 01/28/2022 4:00:02 PM By: Samuella Bruin Signed: 01/28/2022 8:07:28 PM By: [name] CHT EMT BS , , Entered By: [name] on 01/28/2022 11:12:53 -------------------------------------------------------------------------------- Multi Wound Chart Details Patient Name: Date of Service: [name] A. 01/28/2022 10:00 A M Medical Record Number: [id number] Patient Account Number: [id number] Date of Birth/Sex: Treating RN: 17-Mar-1956 (66 y.o. [name] Primary Care Dewey Viens: [name] Other Clinician: Referring Cesily Cuoco: Treating Brittley Regner/Extender:  [name]: 22 Vital Signs Height(in): 74 Capillary Blood Glucose(mg/dl): 545 Weight(lbs): 625 Pulse(bpm): 68 Body Mass Index(BMI): 23.9 Blood Pressure(mmHg): 149/89 Temperature(F): 97.2 Respiratory Rate(breaths/min): 16 Photos: [1:Left, Medial Foot] [N/A:N/A N/A] Wound Location: [1:Gradually Appeared] [N/A:N/A] Wounding Event: [1:Diabetic Wound/Ulcer of the Lower] [N/A:N/A] Primary Etiology: [1:Extremity Cataracts, Coronary Artery Disease, N/A] Comorbid History: [1:Hypertension, Myocardial Infarction, Peripheral Arterial Disease, Type II Diabetes, Osteomyelitis, Neuropathy 02/22/2021] [N/A:N/A] Date Acquired: [1:22] [N/A:N/A] Weeks of Treatment: [1:Open] [N/A:N/A] Wound Status: [1:No] [N/A:N/A] Wound Recurrence: [1:6.4x2.2x0.2] [N/A:N/A] Measurements L x  W x D (cm) [1:11.058] [N/A:N/A] A (cm) : rea [1:2.212] [N/A:N/A] Volume (cm) : [1:21.80%] [N/A:N/A] % Reduction in A [1:rea: 84.40%] [N/A:N/A] % Reduction in Volume: [1:12] Position 1 (o'clock): [1:1.9] Maximum Distance 1 (cm): [1:Yes] [N/A:N/A] Tunneling: [1:Grade 3] [N/A:N/A] Classification: [1:Medium] [N/A:N/A] Exudate A mount: [1:Serosanguineous] [N/A:N/A] Exudate Type: [1:red, brown] [N/A:N/A] Exudate Color: [1:Distinct, outline attached] [N/A:N/A] Wound Margin: [1:Large (67-100%)] [N/A:N/A] Granulation A mount: [1:Red, Hyper-granulation] [N/A:N/A] Granulation Quality: [1:Small (1-33%)] [N/A:N/A] Necrotic A mount: [1:Fat Layer (Subcutaneous Tissue): Yes N/A] Exposed Structures: [1:Fascia: No Tendon: No Muscle: No Joint: No Bone: No Small (1-33%)] [N/A:N/A] Epithelialization: [1:Debridement - Selective/Open Wound N/A] Debridement: Pre-procedure Verification/Time Out 11:21 [N/A:N/A] Taken: [1:Slough] [N/A:N/A] Tissue Debrided: [1:Non-Viable Tissue] [N/A:N/A] Level: [1:0.6] [N/A:N/A] Debridement A (sq cm): [1:rea Curette] [N/A:N/A] Instrument: [1:Minimum] [N/A:N/A] Bleeding:  [1:Pressure] [N/A:N/A] Hemostasis A chieved: [1:0] [N/A:N/A] Procedural Pain: [1:0] [N/A:N/A] Post Procedural Pain: [1:Procedure was tolerated well] [N/A:N/A] Debridement Treatment Response: [1:6.4x2.2x0.2] [N/A:N/A] Post Debridement Measurements L x W x D (cm) [1:2.212] [N/A:N/A] Post Debridement Volume: (cm) [1:Cellular or Tissue Based Product] [N/A:N/A] Procedures Performed: [1:Debridement] Treatment Notes Wound #1 (Foot) Wound Laterality: Left, Medial Cleanser Wound Cleanser Discharge Instruction: Cleanse the wound with wound cleanser prior to applying a clean dressing using gauze sponges, not tissue or cotton balls. Peri-Wound Care Topical Primary Dressing Apligraf Discharge Instruction: DO NOT REMOVE Secondary Dressing Woven Gauze Sponge, Non-Sterile 4x4 in Discharge Instruction: In clinic. Home Health to apply wound vac. Secured With [employer], 4.5x3.1 (in/yd) Discharge Instruction: In clinic. Home Health to apply wound vac. 24M Medipore H Soft Cloth Surgical T ape, 4 x 10 (in/yd) Discharge Instruction: In clinic. Home Health to apply wound vac. Compression Wrap Compression Stockings Add-Ons Electronic Signature(s) Signed: 01/28/2022 12:16:52 PM By: [name] MD FACS Signed: 01/28/2022 4:00:02 PM By: Gelene Mink By: [name] on 01/28/2022 12:16:51 -------------------------------------------------------------------------------- Multi-Disciplinary Care Plan Details Patient Name: Date of Service: [name] A. 01/28/2022 10:00 A M Medical Record Number: [id number] Patient Account Number: [id number] Date of Birth/Sex: Treating RN: 05-12-1956 (66 y.o. [name] Primary Care Tracey Stewart: [name] Other Clinician: Referring Nickson Middlesworth: Treating Barbera Perritt/Extender: [name] in Treatment: 22 Multidisciplinary Care Plan reviewed with physician Active Inactive HBO Nursing Diagnoses: Anxiety  related to feelings of confinement associated with the hyperbaric oxygen chamber Anxiety related to knowledge deficit of hyperbaric oxygen therapy and treatment procedures Discomfort related to temperature and humidity changes inside hyperbaric chamber Potential for barotraumas to ears, sinuses, teeth, and lungs or cerebral gas embolism related to changes in atmospheric pressure inside hyperbaric oxygen chamber Potential for oxygen toxicity seizures related to delivery of 100% oxygen at an increased atmospheric pressure Potential for pulmonary oxygen toxicity related to delivery of 100% oxygen at an increased atmospheric pressure Goals: Barotrauma will be prevented during HBO2 Date Initiated: 09/17/2021 T arget Resolution Date: 02/11/2022 Goal Status: Active Patient will tolerate the hyperbaric oxygen therapy treatment Date Initiated: 09/17/2021 T arget Resolution Date: 02/11/2022 Goal Status: Active Patient will tolerate the internal climate of the chamber Date Initiated: 09/17/2021 T arget Resolution Date: 02/11/2022 Goal Status: Active Patient/caregiver will verbalize understanding of HBO goals, rationale, procedures and potential hazards Date Initiated: 09/17/2021 T arget Resolution Date: 02/11/2022 Goal Status: Active Signs and symptoms of pulmonary oxygen toxicity will be recognized and promptly addressed Date Initiated: 09/17/2021 T arget Resolution Date: 02/11/2022 Goal Status: Active Signs and symptoms of seizure will be recognized and promptly addressed ; seizing patients will suffer no harm Date Initiated:  09/17/2021 T arget Resolution Date: 02/11/2022 Goal Status: Active Interventions: Administer decongestants, per physician orders, prior to HBO2 Administer the correct therapeutic gas delivery based on the patients needs and limitations, per physician order Assess and provide for patients comfort related to the hyperbaric environment and equalization of middle ear Assess for signs and  symptoms related to adverse events, including but not limited to confinement anxiety, pneumothorax, oxygen toxicity and baurotrauma Assess patient for any history of confinement anxiety Assess patient's knowledge and expectations regarding hyperbaric medicine and provide education related to the hyperbaric environment, goals of treatment and prevention of adverse events Implement protocols to decrease risk of pneumothorax in high risk patients Notes: Nutrition Nursing Diagnoses: Impaired glucose control: actual or potential Goals: Patient/caregiver verbalizes understanding of need to maintain therapeutic glucose control per primary care physician Date Initiated: 08/25/2021 Target Resolution Date: 02/11/2022 Goal Status: Active Interventions: Assess HgA1c results as ordered upon admission and as needed Provide education on elevated blood sugars and impact on wound healing Notes: Osteomyelitis Nursing Diagnoses: Infection: osteomyelitis Knowledge deficit related to disease process and management Goals: Patient's osteomyelitis will resolve Date Initiated: 09/17/2021 Target Resolution Date: 02/11/2022 Goal Status: Active Interventions: Assess for signs and symptoms of osteomyelitis resolution every visit Provide education on osteomyelitis Treatment Activities: Surgical debridement : 09/17/2021 Systemic antibiotics : 09/17/2021 T ordered outside of clinic : 09/17/2021 est Notes: Electronic Signature(s) Signed: 01/28/2022 4:00:02 PM By: Samuella Bruin Signed: 01/28/2022 8:07:28 PM By: [name] CHT EMT BS , , Entered By: [name] on 01/28/2022 11:15:01 -------------------------------------------------------------------------------- Pain Assessment Details Patient Name: Date of Service: [name] 01/28/2022 10:00 A M Medical Record Number: [id number] Patient Account Number: [id number] Date of Birth/Sex: Treating RN: 15-Jul-1955 (66 y.o. [name] Primary Care Ramisa Duman: [name] Other Clinician: Referring Ruthanna Macchia: Treating Bladen Umar/Extender: [name]: 22 Active Problems Location of Pain Severity and Description of Pain Patient Has Paino No Site Locations Rate the pain. Rate the pain. Current Pain Level: 0 Pain Management and Medication Current Pain Management: Electronic Signature(s) Signed: 01/28/2022 4:00:02 PM By: Samuella Bruin Signed: 01/28/2022 8:07:28 PM By: [name] CHT EMT BS , , Entered By: [name] on 01/28/2022 11:11:54 -------------------------------------------------------------------------------- Patient/Caregiver Education Details Patient Name: Date of Service: NA [name] 8/18/2023andnbsp10:00 A M Medical Record Number: [id number] Patient Account Number: [id number] Date of Birth/Gender: Treating RN: Aug 17, 1955 (66 y.o. [name] Primary Care Physician: [name] Other Clinician: Referring Physician: Treating Physician/Extender: [name] in Treatment: 22 Education Assessment Education Provided To: Patient Education Topics Provided Wound/Skin Impairment: Methods: Explain/Verbal Responses: Reinforcements needed, State content correctly Electronic Signature(s) Signed: 01/28/2022 8:07:28 PM By: [name] CHT EMT BS , , Entered By: [name] on 01/28/2022 11:15:15 -------------------------------------------------------------------------------- Wound Assessment Details Patient Name: Date of Service: NA [name] 01/28/2022 10:00 A M Medical Record Number: [id number] Patient Account Number: [id number] Date of Birth/Sex: Treating RN: Feb 06, 1956 (66 y.o. [name] Primary Care Riyana Biel: [name] Other Clinician: Referring Jassiah Viviano: Treating Emiliana Blaize/Extender: [name]: 22 Wound Status Wound Number: 1 Primary  Diabetic Wound/Ulcer of the Lower Extremity Etiology: Wound Location: Left, Medial Foot Wound Open Wounding Event: Gradually Appeared Status: Date Acquired: 02/22/2021 Comorbid Cataracts, Coronary Artery Disease, Hypertension, Myocardial Weeks Of Treatment: 22 History: Infarction, Peripheral Arterial Disease, Type II Diabetes, Clustered Wound: No Osteomyelitis, Neuropathy Photos Wound Measurements Length: (cm) 6.4 Width: (cm) 2.2 Depth: (cm) 0.2  Area: (cm) 11.058 Volume: (cm) 2.212 % Reduction in Area: 21.8% % Reduction in Volume: 84.4% Epithelialization: Small (1-33%) Tunneling: Yes Position (o'clock): 12 Maximum Distance: (cm) 1.9 Undermining: No Wound Description Classification: Grade 3 Wound Margin: Distinct, outline attached Exudate Amount: Medium Exudate Type: Serosanguineous Exudate Color: red, brown Foul Odor After Cleansing: No Slough/Fibrino Yes Wound Bed Granulation Amount: Large (67-100%) Exposed Structure Granulation Quality: Red, Hyper-granulation Fascia Exposed: No Necrotic Amount: Small (1-33%) Fat Layer (Subcutaneous Tissue) Exposed: Yes Necrotic Quality: Adherent Slough Tendon Exposed: No Muscle Exposed: No Joint Exposed: No Bone Exposed: No Treatment Notes Wound #1 (Foot) Wound Laterality: Left, Medial Cleanser Wound Cleanser Discharge Instruction: Cleanse the wound with wound cleanser prior to applying a clean dressing using gauze sponges, not tissue or cotton balls. Peri-Wound Care Topical Primary Dressing Apligraf Discharge Instruction: DO NOT REMOVE Secondary Dressing Woven Gauze Sponge, Non-Sterile 4x4 in Discharge Instruction: In clinic. Home Health to apply wound vac. Secured With [employer], 4.5x3.1 (in/yd) Discharge Instruction: In clinic. Home Health to apply wound vac. 51M Medipore H Soft Cloth Surgical T ape, 4 x 10 (in/yd) Discharge Instruction: In clinic. Home Health to apply wound vac. Compression  Wrap Compression Stockings Add-Ons Electronic Signature(s) Signed: 01/28/2022 4:00:02 PM By: Samuella Bruin Entered By: Samuella Bruin on 01/28/2022 11:16:34 -------------------------------------------------------------------------------- Vitals Details Patient Name: Date of Service: NA [name] 01/28/2022 10:00 A M Medical Record Number: [id number] Patient Account Number: [id number] Date of Birth/Sex: Treating RN: August 31, 1955 (66 y.o. [name] Primary Care Cathan Gearin: [name] Other Clinician: [name] Referring Nastacia Raybuck: Treating Brittny Spangle/Extender: [name]: 22 Vital Signs Time Taken: 10:41 Temperature (F): 97.2 Height (in): 74 Pulse (bpm): 68 Weight (lbs): 186 Respiratory Rate (breaths/min): 16 Body Mass Index (BMI): 23.9 Blood Pressure (mmHg): 149/89 Capillary Blood Glucose (mg/dl): 629 Reference Range: 80 - 120 mg / dl Electronic Signature(s) Signed: 01/28/2022 8:07:28 PM By: [name] CHT EMT BS , , Entered By: [name] on 01/28/2022 11:08:37

## 2022-01-28 NOTE — Progress Notes (Signed)
[name], [name] ([id number]) Visit Report for 01/28/2022 HBO Details Patient Name: Date of Service: NA Colin Nephew A. 01/28/2022 8:00 A M Medical Record Number: [id number] Patient Account Number: [id number] Date of Birth/Sex: Treating RN: 1955/12/12 (67 y.o. [name] Primary Care [name]: [name] Other Clinician: [name] Referring Andrzej Scully: Treating Elga Santy/Extender: [name]: 22 HBO Treatment Course Details Treatment Course Number: 1 Ordering Shaquanna Lycan: [name] T Treatments Ordered: otal 120 HBO Treatment Start Date: 09/20/2021 HBO Indication: Diabetic Ulcer(s) of the Lower Extremity Standard/Conservative Wound Care tried and failed greater than or equal to 30 days HBO Treatment Details Treatment Number: 86 Patient Type: Outpatient Chamber Type: Monoplace Chamber Serial #: [id number] Treatment Protocol: 2.0 ATA with 90 minutes oxygen, with two 5 minute air breaks Treatment Details Compression Rate Down: 2.0 psi / minute De-Compression Rate Up: A breaks and breathing ir Compress Tx Pressure periods Decompress Decompress Begins Reached (leave unused spaces Begins Ends blank) Chamber Pressure (ATA 1 2 2 2 2 2  --2 1 ) Clock Time (24 hr) 08:31 08:44 09:14 09:19 09:50 09:55 - - 10:25 10:36 Treatment Length: 125 (minutes) Treatment Segments: 4 Vital Signs Capillary Blood Glucose Reference Range: 80 - 120 mg / dl HBO Diabetic Blood Glucose Intervention Range: <131 mg/dl or mg/dl Time Vitals Blood Respiratory Capillary Blood Glucose Pulse Action Type: Pulse: Temperature: Taken: Pressure: Rate: Glucose (mg/dl): Meter #: Oximetry (%) Taken: Pre 08:25 139/75 75 18 98.1 186 Post 10:41 149/89 68 16 97.2 125 Treatment Response Treatment Toleration: Well Treatment Completion Status: Treatment Completed without Adverse Event Additional Procedure Documentation Tissue Sevierity: Necrosis of bone Physician  HBO Attestation: I certify that I supervised this HBO treatment in accordance with Medicare guidelines. A trained emergency response team is readily available per Yes hospital policies and procedures. Continue HBOT as ordered. Yes Electronic Signature(s) Signed: 01/28/2022 12:37:05 PM By: 01/30/2022 MD FACS Previous Signature: 01/28/2022 10:44:55 AM Version By: 01/30/2022 EMT Previous Signature: 01/28/2022 10:28:28 AM Version By: 01/30/2022 EMT Previous Signature: 01/28/2022 8:49:58 AM Version By: 01/30/2022 EMT Entered By: [name] on 01/28/2022 12:37:04 -------------------------------------------------------------------------------- HBO Safety Checklist Details Patient Name: Date of Service: NA 01/30/2022 [name] 01/28/2022 8:00 A M Medical Record Number: 01/30/2022 Patient Account Number: [id number] Date of Birth/Sex: Treating RN: [birth year] (66 y.o. 77 Primary Care Nygel Prokop: [name] Other Clinician: [name] Referring Gwendola Hornaday: Treating Gerhard Rappaport/Extender: [name]: 22 HBO Safety Checklist Items Safety Checklist Consent Form Signed Patient voided / foley secured and emptied When did you last eato 0700 Last dose of injectable or oral agent 0700 Ostomy pouch emptied and vented if applicable NA All implantable devices assessed, documented and approved NA Intravenous access site secured and place NA Valuables secured Linens and cotton and cotton/polyester blend (less than 51% polyester) Personal oil-based products / skin lotions / body lotions removed Wigs or hairpieces removed NA Smoking or tobacco materials removed Books / newspapers / magazines / loose paper removed Cologne, aftershave, perfume and deodorant removed Jewelry removed (may wrap wedding band) NA Make-up removed NA Hair care products removed Battery operated devices (external) removed Heating patches and chemical  warmers removed Titanium eyewear removed NA Nail polish cured greater than 10 hours NA Casting material cured greater than 10 hours NA Hearing aids removed NA Loose dentures or partials removed NA Prosthetics have been removed NA Patient demonstrates correct use of air break device (if applicable) Patient concerns  have been addressed Patient grounding bracelet on and cord attached to chamber Specifics for Inpatients (complete in addition to above) Medication sheet sent with patient NA Intravenous medications needed or due during therapy sent with patient NA Drainage tubes (e.g. nasogastric tube or chest tube secured and vented) NA Endotracheal or Tracheotomy tube secured NA Cuff deflated of air and inflated with saline NA Airway suctioned NA Notes The safety checklist was done before the treatment was started. Electronic Signature(s) Signed: 01/28/2022 8:49:24 AM By: [name] EMT Entered By: [name] on 01/28/2022 08:49:23

## 2022-01-28 NOTE — Progress Notes (Signed)
[name], [name] ([id number]) Visit Report for 01/24/2022 HBO Details Patient Name: Date of Service: NA Colin Nephew A. 01/24/2022 8:00 A M Medical Record Number: [id number] Patient Account Number: [id number] Date of Birth/Sex: Treating RN: 07-11-1955 (66 y.o. [name], [name] Primary Care [name]: [name] Other Clinician: [name] Referring [name]: Treating [name]/Extender: [name] Weeks in Treatment: 21 HBO Treatment Course Details Treatment Course Number: 1 Ordering [name]: [name] T Treatments Ordered: otal 120 HBO Treatment Start Date: 09/20/2021 HBO Indication: Diabetic Ulcer(s) of the Lower Extremity Standard/Conservative Wound Care tried and failed greater than or equal to 30 days HBO Treatment Details Treatment Number: 82 Patient Type: Outpatient Chamber Type: Monoplace Chamber Serial #: [id number] Treatment Protocol: 2.0 ATA with 90 minutes oxygen, with two 5 minute air breaks Treatment Details Compression Rate Down: 2.0 psi / minute De-Compression Rate Up: 2.0 psi / minute A breaks and breathing ir Compress Tx Pressure periods Decompress Decompress Begins Reached (leave unused spaces Begins Ends blank) Chamber Pressure (ATA 1 2 2 2 2 2  --2 1 ) Clock Time (24 hr) 08:22 08:33 09:03 09:08 08:38 09:43 - - 10:13 10:24 Treatment Length: 122 (minutes) Treatment Segments: 4 Vital Signs Capillary Blood Glucose Reference Range: 80 - 120 mg / dl HBO Diabetic Blood Glucose Intervention Range: <131 mg/dl or mg/dl Time Vitals Blood Respiratory Capillary Blood Glucose Pulse Action Type: Pulse: Temperature: Taken: Pressure: Rate: Glucose (mg/dl): Meter #: Oximetry (%) Taken: Pre 08:18 124/83 79 18 97.7 205 Post 10:31 170/92 68 16 97.3 195 Treatment Response Treatment Toleration: Well Treatment Completion Status: Treatment Completed without Adverse Event Additional Procedure Documentation Tissue Sevierity: Necrosis of  bone Physician HBO Attestation: I certify that I supervised this HBO treatment in accordance with Medicare guidelines. A trained emergency response team is readily available per Yes hospital policies and procedures. Continue HBOT as ordered. Yes Electronic Signature(s) Signed: 01/24/2022 12:14:09 PM By: 01/26/2022 MD FACS Previous Signature: 01/24/2022 10:52:19 AM Version By: 01/26/2022 EMT Entered By: [name] on 01/24/2022 12:14:08 -------------------------------------------------------------------------------- HBO Safety Checklist Details Patient Name: Date of Service: 01/26/2022 Colin A. 01/24/2022 8:00 A M Medical Record Number: 01/26/2022 Patient Account Number: [id number] Date of Birth/Sex: Treating RN: 1955-12-13 (66 y.o. 79, [name] Primary Care [name]: [name] Other Clinician: [name] Referring [name]: Treating [name]/Extender: [name] Weeks in Treatment: 21 HBO Safety Checklist Items Safety Checklist Consent Form Signed Patient voided / foley secured and emptied When did you last eato 0700 Last dose of injectable or oral agent 0700 Ostomy pouch emptied and vented if applicable NA All implantable devices assessed, documented and approved NA Intravenous access site secured and place NA Valuables secured Linens and cotton and cotton/polyester blend (less than 51% polyester) Personal oil-based products / skin lotions / body lotions removed Wigs or hairpieces removed NA Smoking or tobacco materials removed Books / newspapers / magazines / loose paper removed Cologne, aftershave, perfume and deodorant removed Jewelry removed (may wrap wedding band) NA Make-up removed NA Hair care products removed Battery operated devices (external) removed Heating patches and chemical warmers removed Titanium eyewear removed NA Nail polish cured greater than 10 hours NA Casting material cured greater than 10  hours NA Hearing aids removed NA Loose dentures or partials removed NA Prosthetics have been removed NA Patient demonstrates correct use of air break device (if applicable) Patient concerns have been addressed Patient grounding bracelet on and cord attached to chamber Specifics for Inpatients (complete  in addition to above) Medication sheet sent with patient NA Intravenous medications needed or due during therapy sent with patient NA Drainage tubes (e.g. nasogastric tube or chest tube secured and vented) NA Endotracheal or Tracheotomy tube secured NA Cuff deflated of air and inflated with saline NA Airway suctioned NA Notes The safety checklist was done before the treatment was started. Electronic Signature(s) Signed: 01/24/2022 10:50:18 AM By: [name] EMT Entered By: [name] on 01/24/2022 10:50:18

## 2022-01-28 NOTE — Progress Notes (Signed)
[name], [name] ([id number]) Visit Report for 01/24/2022 Problem List Details Patient Name: Date of Service: NA [name] 01/24/2022 8:00 A M Medical Record Number: [id number] Patient Account Number: [id number] Date of Birth/Sex: Treating RN: 1955/09/10 (66 y.o. 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 01/24/2022 Medical Record Number: [id number] Patient Account Number: [id number] Date of Birth/Sex: Treating RN: 03-16-1956 (66 y.o. 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 Facility Procedures CPT4 Code: [id number] Description: G0277-(Facility Use Only) HBOT full body chamber, , ICD-10 Diagnosis Description E11.621 Type 2 diabetes mellitus with foot ulcer 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 M86.672 Other chronic  osteomyelitis, left ankle and foot 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

## 2022-01-28 NOTE — Progress Notes (Signed)
DECKLAN, MAU ([id number]) Visit Report for 01/28/2022 Problem List Details Patient Name: Date of Service: NA [name] 01/28/2022 8:00 A M Medical Record Number: [id number] Patient Account Number: [id number] Date of Birth/Sex: Treating RN: Jul 30, 1955 (66 y.o. 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 01/28/2022 Medical Record Number: [id number] Patient Account Number: [id number] Date of Birth/Sex: Treating RN: Sep 11, 1955 (66 y.o. 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 Facility Procedures CPT4 Code: [id number] Description: G0277-(Facility Use Only) HBOT full body chamber, , ICD-10 Diagnosis Description E11.621 Type 2 diabetes mellitus with foot ulcer 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 M86.672 Other chronic  osteomyelitis, left ankle and foot 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

## 2022-01-28 NOTE — Progress Notes (Signed)
STEFEN, JAQUAY ([id number]) Visit Report for 01/28/2022 Chief Complaint Document Details Patient Name: Date of Service: [name] 01/28/2022 10:00 A M Medical Record Number: [id number] Patient Account Number: [id number] Date of Birth/Sex: Treating RN: September 03, 1955 (66 y.o. [name] Primary Care Provider: [name] Other Clinician: Referring Provider: Treating Provider/Extender: [name] in Treatment: 22 Information Obtained from: Patient Chief Complaint Patients presents for treatment of an open diabetic ulcer with exposed bone and osteomyelitis Electronic Signature(s) Signed: 01/28/2022 12:17:00 PM By: [name] MD FACS Entered By: [name] on 01/28/2022 12:17:00 -------------------------------------------------------------------------------- Cellular or Tissue Based Product Details Patient Name: Date of Service: NA Colin [name] 01/28/2022 10:00 A M Medical Record Number: [id number] Patient Account Number: [id number] Date of Birth/Sex: Treating RN: [birth year] (66 y.o. [name] Primary Care Provider: [name] Other Clinician: Referring Provider: Treating Provider/Extender: [name] in Treatment: 22 Cellular or Tissue Based Product Type Wound #1 Left,Medial Foot Applied to: Performed By: Physician [name], MD Cellular or Tissue Based Product Type: Apligraf Level of Consciousness (Pre-procedure): Awake and Alert Pre-procedure Verification/Time Out Yes - 11:25 Taken: Location: genitalia / hands / feet / multiple digits Wound Size (sq cm): 14.08 Product Size (sq cm): 44 Waste Size (sq cm): 13 Waste Reason: wound size Amount of Product Applied (sq cm): 31 Instrument Used: Blade, Curette, Forceps, Scissors Lot #: [id number] Expiration Date: 02/04/2022 Fenestrated: Yes Instrument: Blade Reconstituted: Yes Solution Type: normal saline Solution Amount: 6  ml Lot #: [id number] Solution Expiration Date: 03/13/2022 Secured: Yes Secured With: Steri-Strips Dressing Applied: Yes Primary Dressing: Adaptic Procedural Pain: 0 Post Procedural Pain: 0 Response to Treatment: Procedure was tolerated well Level of Consciousness (Post- Awake and Alert procedure): Post Procedure Diagnosis Same as Pre-procedure Electronic Signature(s) Signed: 01/28/2022 12:36:36 PM By: [name] MD FACS Signed: 01/28/2022 8:07:28 PM By: Donavan Burnet CHT EMT BS , , Entered By: Donavan Burnet on 01/28/2022 11:28:01 -------------------------------------------------------------------------------- Debridement Details Patient Name: Date of Service: Colin Loveless A. 01/28/2022 10:00 A M Medical Record Number: [id number] Patient Account Number: [id number] Date of Birth/Sex: Treating RN: 06-Apr-1956 (66 y.o. [name] Primary Care Provider: [name] Other Clinician: Referring Provider: Treating Provider/Extender: [name] Weeks in Treatment: 22 Debridement Performed for Assessment: Wound #1 Left,Medial Foot Performed By: Physician [name], MD Debridement Type: Debridement Severity of Tissue Pre Debridement: Fat layer exposed Level of Consciousness (Pre-procedure): Awake and Alert Pre-procedure Verification/Time Out Yes - 11:21 Taken: Start Time: 11:21 T Area Debrided (L x W): otal 1.2 (cm) x 0.5 (cm) = 0.6 (cm) Tissue and other material debrided: Non-Viable, Slough, Slough Level: Non-Viable Tissue Debridement Description: Selective/Open Wound Instrument: Curette Bleeding: Minimum Hemostasis Achieved: Pressure Procedural Pain: 0 Post Procedural Pain: 0 Response to Treatment: Procedure was tolerated well Level of Consciousness (Post- Awake and Alert procedure): Post Debridement Measurements of Total Wound Length: (cm) 6.4 Width: (cm) 2.2 Depth: (cm) 0.2 Volume: (cm) 2.212 Character of Wound/Ulcer  Post Debridement: Improved Severity of Tissue Post Debridement: Fat layer exposed Post Procedure Diagnosis Same as Pre-procedure Electronic Signature(s) Signed: 01/28/2022 12:36:36 PM By: [name] MD FACS Signed: 01/28/2022 4:00:02 PM By: Adline Peals Signed: 01/28/2022 8:07:28 PM By: Donavan Burnet CHT EMT BS , , Entered By: Donavan Burnet on 01/28/2022 11:24:46 -------------------------------------------------------------------------------- HPI Details Patient Name: Date of Service: [name] 01/28/2022 10:00 A M Medical Record Number: [id number] Patient Account Number: [id number] Date  of Birth/Sex: Treating RN: December 16, 1955 (66 y.o. [name] Primary Care Provider: [name] Other Clinician: Referring Provider: Treating Provider/Extender: [name] in Treatment: 22 History of Present Illness HPI Description: ADMISSION 08/25/2021 This is a 66 year old man who initially presented to his primary care provider in September 2022 with pain in his left foot. He was sent for an x-ray and while the x-ray was being performed, the tech pointed out a wound on his foot that the patient was not aware existed. He does have type 2 diabetes with significant neuropathy. His diabetes is suboptimally controlled with his most recent A1c being 8.5. He also has a history of coronary artery disease status post three- vessel CABG. he was initially seen by orthopedics, but they referred him to [facility]. He has undergone at least 7 operations/debridements and several applications of skin substitute under the care of podiatry. He has been in a wound VAC for much of this time. His most recent procedure was July 28, 2021. A portion of the talus was biopsied and was found to be consistent with osteomyelitis. Culture also returned positive for corynebacterium. He was seen on August 16, 2021 by infectious disease. A PICC line has been  placed and he will be receiving a 6-week course of IV daptomycin and cefepime. In October 2022, he underwent lower extremity vascular studies. Results are copied here: Right: Resting right ankle-brachial index is within normal range. No evidence of significant right lower extremity arterial disease. The right toe-brachial index is abnormal. Left: Resting left ankle-brachial index indicates mild left lower extremity arterial disease. The left toe-brachial index is abnormal. He has not been seen by vascular surgery despite these findings. He presented to clinic today in a cam boot and is using a knee scooter to offload. Wound VAC was in place. Once this was removed, a large ulcer was identified on the left midfoot/ankle. Bone is frankly exposed. There is no malodorous or purulent drainage. There is some granulation tissue over the central portion of the exposed bone. There is a tunnel that extends posteriorly for roughly 10 cm. It has been discussed with him by multiple providers that he is at very high risk of losing his lower leg because of this wound. He is extremely eager to avoid this outcome and is here today to review his options as well as receive ongoing wound care. 09/03/2021: Here for reevaluation of his wound. There does not appear to have been any substantial improvement overall since our last visit. He has been in a wound VAC with white foam overlying the exposed bone. We are working on getting him approved for hyperbaric oxygen therapy. 09/10/2021: We are in the process of getting him cleared to begin hyperbaric oxygen therapy. He still needs to obtain a chest x-ray. Although the wound measurements are roughly the same, I think the overall appearance of the wound is better. The exposed bone has a bit more granulation tissue covering it. He has not received a vascular surgery appointment to reevaluate his flow to the wound. 09/17/2021: He has been approved for hyperbaric oxygen therapy and  completed his chest x-ray, which I reviewed and it appears normal. The tunnels at the 12 and 10:00 positions are smaller. There is more granulation tissue covering the exposed bone and the undermining has decreased. He still has not received a vascular surgery appointment. 09/24/2021: He initiated hyperbaric oxygen therapy this week and is tolerating it well. He has an appointment with vascular  surgery coming up on May 16. The granulation tissue is covering more of the exposed bone and both tunnels are a bit smaller. 10/01/2021: He continues to tolerate hyperbaric oxygen therapy. He saw infectious disease and they are planning to pull his PICC line. He has been initiated on oral antibiotics (doxycycline and Augmentin). The wound looks about the same but the tunnels are a little bit smaller. The skin seems to be contracting somewhat around the exposed bone. 10/08/2021: The wound is still about the same size, but the tunnels continue to come in and the skin is contracting around the exposed bone. He continues to have some accumulation of necrotic material in the inferoposterior aspect of the wound as well as accumulation at the 12:00 tunnel area. 10/15/2021: The wound is smaller today. The tunnels continue to come in. There is less necrotic tissue present. He does have some periwound maceration. 10/22/2021: The wound is about the same size. There is a little bit less undermining at the distal portion. The exposed bone is dark and I am not sure if this is staining from silver nitrate or his VAC sponge or if it represents necrosis. The tunnels are shallower but he does have some serous drainage coming from the 10:00 tunnel. He continues to tolerate hyperbaric oxygen therapy well. 10/29/2021: The undermining continues to improve. The tunnels are about the same. He has good granulation tissue overlying the majority of the exposed bone. It does appear that perhaps the tubing from his wound VAC has been eroding the  skin at the 12 clock position. He continues to accumulate senescent epithelium around the borders of the wound. 11/05/2021: The undermining is almost completely resolved. The tunnels have contracted fairly significantly. No significant slough or debris accumulation. There is still senescent epithelium accumulation around the borders of the wound. He has been tolerating hyperbaric oxygen therapy well. 11/12/2021: Despite the measurements of the wound being about the same, the wound has changed in its shape and overall, I think it is improved. The undermining has resolved and the tunnels continue to shorten. There is good granulation tissue encroaching over the small area of bone that has remained exposed at the 12 o'clock position. Minimal slough accumulation. He continues to tolerate hyperbaric oxygen therapy well. 11/19/2021: I took a PCR culture last week. There was overgrowth of yeast. He is already taking suppressive doxycycline and Augmentin. I added fluconazole to his regimen. The wound is smaller again today. The tunnels continue to shorten. He continues to do well with hyperbaric oxygen therapy. 11/26/2021: For some reason, his foot has become macerated. The wound is narrower but about the same dimensions in its longitudinal aspect. The tunnels continue to shorten. He has some slough buildup on the wound as well as some heaped up senescent epithelium around the perimeter. 12/03/2021: No further maceration of his foot has occurred. The wound has contracted quite significantly from last week. The tunnel at 10:00 is closed. The tunnel at 12:00 is down to just a couple of millimeters. No other undermining is present. There is soft tissue coverage of the previously exposed bone. There is just a bit of slough and biofilm on the wound surface. 12/10/2021: The wound is looking good. It turns out the tunnel at 12:00 is only exposed when the patient dorsiflexes his foot. It is about 2 cm in depth when he does  this; when his foot is in plantarflexion, the tunnel is closed. The bone that was visible at the 12:00 tunnel is completely covered with  granulation tissue, but there does feel like some exposed bone deeper into the tunnel area. There is senescent skin heaped up around the periphery. Minimal slough on the wound surface. 12/16/2021: The wound dimensions are roughly the same. The surface has nice granulation tissue. The exposed bone at the 12:00 tunnel continues to be covered with more soft tissue. 7/14; patient's wound measures smaller today. Using the wound VAC with underlying collagen. He is also being treated with HBO for underlying osteomyelitis. He tells me he is on doxycycline and ampicillin follows with infectious disease next week 12/31/2021: The wound continues to contract. Unfortunately, the area where the track pad and tubing have been rubbing continues to look like it is applying friction. He says that the home health nurses that have been applying the [facility] have been putting gauze underneath the tubing, but nonetheless there is ongoing tissue breakdown at this site. Light slough accumulation on the wound surface. The tunnel continues to contract. He is tolerating HBO without difficulty. 01/07/2022: Bridging the wound VAC away from the ankle has resulted in significant improvement in the tissue at the apex of the wound. The tunnel is still present and is not all that much shorter, but the overall wound surface is very robust and healthy looking. Minimal slough accumulation. No concern for acute infection. 01/21/2022: The wound continues to contract and has a robust granulation tissue surface. The tunnel has come in considerably and is down to about 1.4 cm. There is still bone exposed within the tunnel but the rest of it is well covered. There is some senescent epithelium at the wound margins and a little bit of slough on the surface. 01/28/2022: No significant change in the wound this week, but  there has not been any reaccumulation of senescent epithelium or slough. The tunnel is perhaps a millimeter less in depth. He has been approved for Apligraf and we will apply this today. Electronic Signature(s) Signed: 01/28/2022 12:17:59 PM By: [name] MD FACS Entered By: [name] on 01/28/2022 12:17:59 -------------------------------------------------------------------------------- Physical Exam Details Patient Name: Date of Service: NA Altamease Oiler, [name] 01/28/2022 10:00 A M Medical Record Number: [id number] Patient Account Number: [id number] Date of Birth/Sex: Treating RN: 1956/04/09 (66 y.o. [name] Primary Care Provider: [name] Other Clinician: Referring Provider: Treating Provider/Extender: [name] Weeks in Treatment: 22 Constitutional Slightly hypertensive. . . . No acute distress.Marland Kitchen Respiratory Normal work of breathing on room air.. Notes 01/28/2022: No significant change in the wound this week, but there has not been any reaccumulation of senescent epithelium or slough. The tunnel is perhaps a millimeter less in depth. Electronic Signature(s) Signed: 01/28/2022 12:19:17 PM By: [name] MD FACS Entered By: [name] on 01/28/2022 12:19:17 -------------------------------------------------------------------------------- Physician Orders Details Patient Name: Date of Service: NA Violet Baldy [name] 01/28/2022 10:00 A M Medical Record Number: [id number] Patient Account Number: [id number] Date of Birth/Sex: Treating RN: 1955/08/20 (66 y.o. 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 - Dr. Celine Ahr - Room 2 - Return A  Bathing/ Shower/ Hygiene May shower with protection but do not get wound dressing(s) wet. Negative Presssure Wound Therapy Black Foam Other: - WOUND VAC CONTINUOUSLY AT 100MM/HG PRESSURE Edema Control - Lymphedema / SCD / Other Elevate legs to the level of the heart or above for 30 minutes daily and/or when sitting, a frequency of: [place] wound care orders this week; continue Home Health for wound care. May utilize formulary equivalent dressing for wound treatment orders unless otherwise specified. - WOUND VAC CONTINUOUSLY AT 100MM/HG PRESSURE - DO NOT REMOVE SKIN SUB DRESSING - ONLY PUT THE WOUND VAC ON 01/28/2022 WILL NOT NEED TO CHANGE UNTIL 02/04/2022 Other Home Health Orders/Instructions: [facility] Hyperbaric Oxygen Therapy Evaluate for HBO Therapy Indication: - Wagner 3 diabetic ulcer left foot If appropriate for treatment, begin HBOT per protocol: 2.5 ATA for 90 Minutes with 2 Five (5) Minute A Breaks ir Total Number of Treatments: - 12/31/2021 additional 40 treatments. T of 120 otal One treatments per day (delivered Monday through Friday unless otherwise specified in Special Instructions below): Finger stick Blood Glucose Pre- and Post- HBOT Treatment. Follow Hyperbaric Oxygen Glycemia Protocol A frin (Oxymetazoline HCL) 0.05% nasal spray - 1 spray in both nostrils daily as needed prior to HBO treatment for difficulty clearing ears Wound Treatment Wound #1 - Foot Wound Laterality: Left, Medial Cleanser: Wound Cleanser 1 x Per Week/30 Days Discharge Instructions: Cleanse the wound with wound cleanser prior to applying a clean dressing using gauze sponges, not tissue or cotton  balls. Prim Dressing: Apligraf 1 x Per Week/30 Days ary Discharge Instructions: DO NOT REMOVE Secondary Dressing: Woven Gauze Sponge, Non-Sterile 4x4 in 1 x Per Week/30 Days Discharge Instructions: In clinic. Home Health to apply wound vac. Secured With: [employer], 4.5x3.1 (in/yd) 1 x Per Week/30 Days Discharge Instructions: In clinic. Home Health to apply wound vac. Secured With: 34M Medipore H Soft Cloth Surgical T ape, 4 x 10 (in/yd) 1 x Per Week/30 Days Discharge Instructions: In clinic. Home Health to apply wound vac. GLYCEMIA INTERVENTIONS PROTOCOL PRE-HBO GLYCEMIA INTERVENTIONS ACTION INTERVENTION Obtain pre-HBO capillary blood glucose (ensure 1 physician order is in chart). A. Notify HBO physician and await physician orders. 2 If result is 70 mg/dl or below: B. If the result meets the hospital definition of a critical result, follow hospital policy. A. Give patient an 8 ounce Glucerna Shake, an 8 ounce Ensure, or 8 ounces of a Glucerna/Ensure equivalent dietary supplement*. B. Wait 30 minutes. If result is 71 mg/dl to 130 mg/dl: C. Retest patients capillary blood glucose (CBG). D. If result greater than or equal to 110 mg/dl, proceed with HBO. If result less than 110 mg/dl, notify HBO physician and consider holding HBO. If result is 131 mg/dl to 249 mg/dl: A. Proceed with HBO. A. Notify HBO physician and await physician orders. B. It is recommended to hold HBO and do If result is 250 mg/dl or greater: blood/urine ketone testing. C. If the result meets the hospital definition of a critical result, follow hospital policy. POST-HBO GLYCEMIA INTERVENTIONS ACTION INTERVENTION Obtain post HBO capillary blood glucose (ensure 1 physician order is in chart). A. Notify HBO physician and await physician orders. 2 If result is 70 mg/dl or below: B. If the result meets the hospital definition of a critical result, follow hospital policy. A. Give patient an 8  ounce Glucerna Shake, an 8 ounce Ensure, or 8 ounces of a Glucerna/Ensure equivalent dietary supplement*. B. Wait 15 minutes for symptoms of If result  is 71 mg/dl to 100 mg/dl: hypoglycemia (i.e. nervousness, anxiety, sweating, chills, clamminess, irritability, confusion, tachycardia or dizziness). C. If patient asymptomatic, discharge patient. If patient symptomatic, repeat capillary blood glucose (CBG) and notify HBO physician. If result is 101 mg/dl to 249 mg/dl: A. Discharge patient. A. Notify HBO physician and await physician orders. B. It is recommended to do blood/urine ketone If result is 250 mg/dl or greater: testing. C. If the result meets the hospital definition of a critical result, follow hospital policy. *Juice or candies are NOT equivalent products. If patient refuses the Glucerna or Ensure, please consult the hospital dietitian for an appropriate substitute. Electronic Signature(s) Signed: 01/28/2022 12:36:36 PM By: [name] MD FACS Entered By: [name] on 01/28/2022 12:19:45 -------------------------------------------------------------------------------- Problem List Details Patient Name: Date of Service: [name] 01/28/2022 10:00 A M Medical Record Number: [id number] Patient Account Number: [id number] Date of Birth/Sex: Treating RN: [birth year] (66 y.o. 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 01/28/2022 10:00 A M Medical Record Number: [id number] Patient Account Number: [id number] Date of Birth/Sex: Treating RN: [birth year] (66 y.o. [name] Primary Care Provider: [name] Other Clinician: Referring Provider: Treating Provider/Extender: [name] in Treatment: 22 Subjective Chief Complaint Information obtained from Patient Patients presents for treatment of an open diabetic ulcer with exposed bone and osteomyelitis History of Present Illness (HPI) ADMISSION 08/25/2021 This is a 66 year old man who initially presented to his primary care provider in September 2022 with pain in his left foot. He was sent for an x-ray and while the x-ray was being performed, the tech pointed out a wound on his foot that the patient was not aware existed. He does have type 2 diabetes with significant neuropathy. His diabetes is suboptimally controlled with his most recent A1c being 8.5. He also has a history of coronary artery disease status post three- vessel CABG. he was initially seen by orthopedics, but they referred him to [facility]. He has undergone at least 7 operations/debridements and several applications of skin substitute under the care of podiatry. He has been in a wound VAC for much of this time. His most recent procedure was July 28, 2021. A portion of the talus was biopsied and was found to be consistent with osteomyelitis. Culture also returned positive for corynebacterium. He was seen on August 16, 2021 by  infectious disease. A PICC line has been placed and he will be receiving a 6-week course of IV daptomycin and cefepime. In October 2022, he underwent lower extremity vascular studies. Results are copied here: Right: Resting right ankle-brachial index is within normal range. No evidence of significant right lower extremity arterial disease. The right toe-brachial index is abnormal. Left: Resting left ankle-brachial index indicates mild left  lower extremity arterial disease. The left toe-brachial index is abnormal. He has not been seen by vascular surgery despite these findings. He presented to clinic today in a cam boot and is using a knee scooter to offload. Wound VAC was in place. Once this was removed, a large ulcer was identified on the left midfoot/ankle. Bone is frankly exposed. There is no malodorous or purulent drainage. There is some granulation tissue over the central portion of the exposed bone. There is a tunnel that extends posteriorly for roughly 10 cm. It has been discussed with him by multiple providers that he is at very high risk of losing his lower leg because of this wound. He is extremely eager to avoid this outcome and is here today to review his options as well as receive ongoing wound care. 09/03/2021: Here for reevaluation of his wound. There does not appear to have been any substantial improvement overall since our last visit. He has been in a wound VAC with white foam overlying the exposed bone. We are working on getting him approved for hyperbaric oxygen therapy. 09/10/2021: We are in the process of getting him cleared to begin hyperbaric oxygen therapy. He still needs to obtain a chest x-ray. Although the wound measurements are roughly the same, I think the overall appearance of the wound is better. The exposed bone has a bit more granulation tissue covering it. He has not received a vascular surgery appointment to reevaluate his flow to the wound. 09/17/2021: He has been  approved for hyperbaric oxygen therapy and completed his chest x-ray, which I reviewed and it appears normal. The tunnels at the 12 and 10:00 positions are smaller. There is more granulation tissue covering the exposed bone and the undermining has decreased. He still has not received a vascular surgery appointment. 09/24/2021: He initiated hyperbaric oxygen therapy this week and is tolerating it well. He has an appointment with vascular surgery coming up on May 16. The granulation tissue is covering more of the exposed bone and both tunnels are a bit smaller. 10/01/2021: He continues to tolerate hyperbaric oxygen therapy. He saw infectious disease and they are planning to pull his PICC line. He has been initiated on oral antibiotics (doxycycline and Augmentin). The wound looks about the same but the tunnels are a little bit smaller. The skin seems to be contracting somewhat around the exposed bone. 10/08/2021: The wound is still about the same size, but the tunnels continue to come in and the skin is contracting around the exposed bone. He continues to have some accumulation of necrotic material in the inferoposterior aspect of the wound as well as accumulation at the 12:00 tunnel area. 10/15/2021: The wound is smaller today. The tunnels continue to come in. There is less necrotic tissue present. He does have some periwound maceration. 10/22/2021: The wound is about the same size. There is a little bit less undermining at the distal portion. The exposed bone is dark and I am not sure if this is staining from silver nitrate or his VAC sponge or if it represents necrosis. The tunnels are shallower but he does have some serous drainage coming from the 10:00 tunnel. He continues to tolerate hyperbaric oxygen therapy well. 10/29/2021: The undermining continues to improve. The tunnels are about the same. He has good granulation tissue overlying the majority of the exposed bone. It does appear that perhaps the  tubing from his wound VAC has been eroding the skin at the 12 clock position. He continues to accumulate senescent  epithelium around the borders of the wound. 11/05/2021: The undermining is almost completely resolved. The tunnels have contracted fairly significantly. No significant slough or debris accumulation. There is still senescent epithelium accumulation around the borders of the wound. He has been tolerating hyperbaric oxygen therapy well. 11/12/2021: Despite the measurements of the wound being about the same, the wound has changed in its shape and overall, I think it is improved. The undermining has resolved and the tunnels continue to shorten. There is good granulation tissue encroaching over the small area of bone that has remained exposed at the 12 o'clock position. Minimal slough accumulation. He continues to tolerate hyperbaric oxygen therapy well. 11/19/2021: I took a PCR culture last week. There was overgrowth of yeast. He is already taking suppressive doxycycline and Augmentin. I added fluconazole to his regimen. The wound is smaller again today. The tunnels continue to shorten. He continues to do well with hyperbaric oxygen therapy. 11/26/2021: For some reason, his foot has become macerated. The wound is narrower but about the same dimensions in its longitudinal aspect. The tunnels continue to shorten. He has some slough buildup on the wound as well as some heaped up senescent epithelium around the perimeter. 12/03/2021: No further maceration of his foot has occurred. The wound has contracted quite significantly from last week. The tunnel at 10:00 is closed. The tunnel at 12:00 is down to just a couple of millimeters. No other undermining is present. There is soft tissue coverage of the previously exposed bone. There is just a bit of slough and biofilm on the wound surface. 12/10/2021: The wound is looking good. It turns out the tunnel at 12:00 is only exposed when the patient dorsiflexes his  foot. It is about 2 cm in depth when he does this; when his foot is in plantarflexion, the tunnel is closed. The bone that was visible at the 12:00 tunnel is completely covered with granulation tissue, but there does feel like some exposed bone deeper into the tunnel area. There is senescent skin heaped up around the periphery. Minimal slough on the wound surface. 12/16/2021: The wound dimensions are roughly the same. The surface has nice granulation tissue. The exposed bone at the 12:00 tunnel continues to be covered with more soft tissue. 7/14; patient's wound measures smaller today. Using the wound VAC with underlying collagen. He is also being treated with HBO for underlying osteomyelitis. He tells me he is on doxycycline and ampicillin follows with infectious disease next week 12/31/2021: The wound continues to contract. Unfortunately, the area where the track pad and tubing have been rubbing continues to look like it is applying friction. He says that the home health nurses that have been applying the [facility] have been putting gauze underneath the tubing, but nonetheless there is ongoing tissue breakdown at this site. Light slough accumulation on the wound surface. The tunnel continues to contract. He is tolerating HBO without difficulty. 01/07/2022: Bridging the wound VAC away from the ankle has resulted in significant improvement in the tissue at the apex of the wound. The tunnel is still present and is not all that much shorter, but the overall wound surface is very robust and healthy looking. Minimal slough accumulation. No concern for acute infection. 01/21/2022: The wound continues to contract and has a robust granulation tissue surface. The tunnel has come in considerably and is down to about 1.4 cm. There is still bone exposed within the tunnel but the rest of it is well covered. There is some senescent epithelium at  the wound margins and a little bit of slough on the surface. 01/28/2022: No  significant change in the wound this week, but there has not been any reaccumulation of senescent epithelium or slough. The tunnel is perhaps a millimeter less in depth. He has been approved for Apligraf and we will apply this today. Patient History Information obtained from Patient. Family History Cancer - Father, Diabetes - Father,Mother,Paternal Grandparents, Heart Disease - Father, Hypertension - Father, No family history of Hereditary Spherocytosis, Kidney Disease, Lung Disease, Seizures, Stroke, Thyroid Problems, Tuberculosis. Social History Never smoker, Marital Status - Married, Alcohol Use - Rarely, Drug Use - No History, Caffeine Use - Daily. Medical History Eyes Patient has history of Cataracts - Removed 2008 Cardiovascular Patient has history of Coronary Artery Disease, Hypertension, Myocardial Infarction, Peripheral Arterial Disease Endocrine Patient has history of Type II Diabetes Musculoskeletal Patient has history of Osteomyelitis Neurologic Patient has history of Neuropathy Medical A Surgical History Notes nd Cardiovascular Hypercholesterolemia Abnormal EKG CABG X3 2019 Gastrointestinal GERD Musculoskeletal Diabetic foot ulcer Objective Constitutional Slightly hypertensive. No acute distress.. Vitals Time Taken: 10:41 AM, Height: 74 in, Weight: 186 lbs, BMI: 23.9, Temperature: 97.2 F, Pulse: 68 bpm, Respiratory Rate: 16 breaths/min, Blood Pressure: 149/89 mmHg, Capillary Blood Glucose: 125 mg/dl. Respiratory Normal work of breathing on room air.. General Notes: 01/28/2022: No significant change in the wound this week, but there has not been any reaccumulation of senescent epithelium or slough. The tunnel is perhaps a millimeter less in depth. Integumentary (Hair, Skin) Wound #1 status is Open. Original cause of wound was Gradually Appeared. The date acquired was: 02/22/2021. The wound has been in treatment 22 weeks. The wound is located on the Left,Medial Foot.  The wound measures 6.4cm length x 2.2cm width x 0.2cm depth; 11.058cm^2 area and 2.212cm^3 volume. There is Fat Layer (Subcutaneous Tissue) exposed. There is no undermining noted, however, there is tunneling at 12:00 with a maximum distance of 1.9cm. There is a medium amount of serosanguineous drainage noted. The wound margin is distinct with the outline attached to the wound base. There is large (67-100%) red, hyper - granulation within the wound bed. There is a small (1-33%) amount of necrotic tissue within the wound bed including Adherent Slough. 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 There was a Selective/Open Wound Non-Viable Tissue Debridement with a total area of 0.6 sq cm performed by [name], MD. With the following instrument(s): Curette to remove Non-Viable tissue/material. Material removed includes [facility]. No specimens were taken. A time out was conducted at 11:21, prior to the start of the procedure. A Minimum amount of bleeding was controlled with Pressure. The procedure was tolerated well with a pain level of 0 throughout and a pain level of 0 following the procedure. Post Debridement Measurements: 6.4cm length x 2.2cm width x 0.2cm depth; 2.212cm^3 volume. Character of Wound/Ulcer Post Debridement is improved. Severity of Tissue Post Debridement is: Fat layer exposed. Post procedure Diagnosis Wound #1: Same as Pre-Procedure Pre-procedure diagnosis of Wound #1 is a Diabetic Wound/Ulcer of the  Lower Extremity located on the Left,Medial Foot. A skin graft  procedure using a bioengineered skin substitute/cellular or tissue based product was performed by [name], MD with the following instrument(s): Blade, Curette, Forceps, and Scissors. Apligraf was applied and secured with Steri-Strips. 31 sq cm of product was utilized and 13 sq cm was wasted due to wound size. Post Application, Adaptic was applied. A Time Out was conducted at 11:25, prior to the start of the procedure. The procedure was tolerated well with a pain level of 0 throughout and a pain level of 0 following the procedure. Post procedure Diagnosis Wound #1: Same as Pre-Procedure . Plan Follow-up Appointments: Return Appointment in 1 week. - Dr. Celine Ahr - Room 2 - Bathing/ Shower/ Hygiene: May shower with protection but do not get wound dressing(s) wet. Negative Presssure Wound Therapy: Black Foam Other: - WOUND VAC CONTINUOUSLY AT 100MM/HG PRESSURE Edema Control - Lymphedema / SCD / Other: Elevate legs to the level of the heart or above for 30 minutes daily and/or when sitting, a frequency of: Home Health: New wound care orders this week; continue Home Health for wound care. May utilize formulary equivalent dressing for wound treatment orders unless otherwise specified. - WOUND VAC CONTINUOUSLY AT 100MM/HG PRESSURE - DO NOT REMOVE SKIN SUB DRESSING - ONLY PUT THE WOUND VAC ON 01/28/2022 WILL NOT NEED TO CHANGE UNTIL 02/04/2022 Other Home Health Orders/Instructions: [facility] Hyperbaric Oxygen Therapy: Evaluate for HBO Therapy Indication: - Wagner 3 diabetic ulcer left foot If appropriate for treatment, begin HBOT per protocol: 2.5 ATA for 90 Minutes with 2 Five (5) Minute Air Breaks T Number of Treatments: - 12/31/2021 additional 40 treatments. T of 120 otal otal One treatments per day (delivered Monday through Friday unless otherwise specified in Special Instructions below): Finger stick Blood Glucose Pre- and  Post- HBOT Treatment. Follow Hyperbaric Oxygen Glycemia Protocol Afrin (Oxymetazoline HCL) 0.05% nasal spray - 1 spray in both nostrils daily as needed prior to HBO treatment for difficulty clearing ears WOUND #1: - Foot Wound Laterality: Left, Medial Cleanser: Wound Cleanser 1 x Per Week/30 Days Discharge Instructions: Cleanse the wound with wound cleanser prior to applying a clean dressing using gauze sponges, not tissue or cotton balls. Prim Dressing: Apligraf 1 x Per Week/30 Days ary Discharge Instructions: DO NOT REMOVE Secondary Dressing: Woven Gauze Sponge, Non-Sterile 4x4 in 1 x Per Week/30 Days Discharge Instructions: In clinic. Home Health to apply wound vac. Secured With: [employer], 4.5x3.1 (in/yd) 1 x Per Week/30 Days Discharge Instructions: In clinic. Home Health to apply wound vac. Secured With: 91M Medipore H Soft Cloth Surgical T ape, 4 x 10 (in/yd) 1 x Per Week/30 Days Discharge Instructions: In clinic. Home Health to apply wound vac. 01/28/2022: No significant change in the wound this week, but there has not been any reaccumulation of senescent epithelium or significant slough. The tunnel is perhaps a millimeter less in depth. I debrided a tiny amount of slough from the most distal portion of the wound. I then fenestrated the Apligraf and applied it in standard fashion, securing it in place with Adaptic and Steri-Strips. We will have the home health nurse reapply his wound VAC but decrease the suction to 100 mmHg. The wound VAC should remain in place for the entire week without changing it. Continue hyperbaric oxygen therapy. Follow-up in 1 week. Electronic Signature(s) Signed: 01/28/2022 12:21:52 PM By: [name] MD FACS Entered By: [name] on 01/28/2022 12:21:52 -------------------------------------------------------------------------------- HxROS Details Patient Name: Date of Service: NA Colin N, Ferron [name] 01/28/2022 10:00 A M  Medical Record  Number: [id number] Patient Account Number: [id number] Date of Birth/Sex: Treating RN: 1955-11-15 (66 y.o. [name] Primary Care Provider: [name] Other Clinician: Referring Provider: Treating Provider/Extender: [name] Weeks in Treatment: 62 Information Obtained From Patient Eyes Medical History: Positive for: Cataracts - Removed 2008 Cardiovascular Medical History: Positive for: Coronary Artery Disease; Hypertension; Myocardial Infarction; Peripheral Arterial Disease Past Medical History Notes: Hypercholesterolemia Abnormal EKG CABG X3 2019 Gastrointestinal Medical History: Past Medical History Notes: GERD Endocrine Medical History: Positive for: Type II Diabetes Time with diabetes: 24 years Treated with: Insulin, Oral agents Blood sugar tested every day: Yes Tested : Musculoskeletal Medical History: Positive for: Osteomyelitis Past Medical History Notes: Diabetic foot ulcer Neurologic Medical History: Positive for: Neuropathy HBO Extended History Items Eyes: Cataracts Immunizations Pneumococcal Vaccine: Received Pneumococcal Vaccination: Yes Received Pneumococcal Vaccination On or After 60th Birthday: Yes Implantable Devices Yes Family and Social History Cancer: Yes - Father; Diabetes: Yes - Father,Mother,Paternal Grandparents; Heart Disease: Yes - Father; Hereditary Spherocytosis: No; Hypertension: Yes - Father; Kidney Disease: No; Lung Disease: No; Seizures: No; Stroke: No; Thyroid Problems: No; Tuberculosis: No; Never smoker; Marital Status - Married; Alcohol Use: Rarely; Drug Use: No History; Caffeine Use: Daily; Financial Concerns: No; Food, Clothing or Shelter Needs: No; Support System Lacking: No; Transportation Concerns: No Electronic Signature(s) Signed: 01/28/2022 12:36:36 PM By: [name] MD FACS Signed: 01/28/2022 4:00:02 PM By: Adline Peals Entered By: [name] on 01/28/2022  12:18:48 -------------------------------------------------------------------------------- SuperBill Details Patient Name: Date of Service: NA Colin [name] 01/28/2022 Medical Record Number: [id number] Patient Account Number: [id number] Date of Birth/Sex: Treating RN: 11-01-1955 (66 y.o. 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 Facility Procedures CPT4 Code: [id number] Description: (Facility Use Only) Apligraf 1 SQ CM Modifier: Quantity: 42 CPT4 Code: [id number] L Description: [id number] - SKIN SUB GRAFT FACE/NK/HF/G ICD-10 Diagnosis Description 97.324 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 Modifier: Quantity: 1 Physician Procedures : CPT4 Code Description Modifier [id number] - WC PHYS LEVEL 4 - EST PT 25 ICD-10 Diagnosis Description L97.324 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

## 2022-01-28 NOTE — Progress Notes (Signed)
[name], [name] ([id number]) Visit Report for 01/25/2022 HBO Details Patient Name: Date of Service: NA Colin Nephew A. 01/25/2022 8:00 A M Medical Record Number: [id number] Patient Account Number: [id number] Date of Birth/Sex: Treating RN: 1955/07/10 (66 y.o. [name] Primary Care [name]: [name] Other Clinician: [name] Referring [name]: Treating [name]/Extender: [name] Weeks in Treatment: 21 HBO Treatment Course Details Treatment Course Number: 1 Ordering [name]: [name] T Treatments Ordered: otal 120 HBO Treatment Start Date: 09/20/2021 HBO Indication: Diabetic Ulcer(s) of the Lower Extremity Standard/Conservative Wound Care tried and failed greater than or equal to 30 days HBO Treatment Details Treatment Number: 83 Patient Type: Outpatient Chamber Type: Monoplace Chamber Serial #: [id number] Treatment Protocol: 2.0 ATA with 90 minutes oxygen, with two 5 minute air breaks Treatment Details Compression Rate Down: 2.0 psi / minute De-Compression Rate Up: A breaks and breathing ir Compress Tx Pressure periods Decompress Decompress Begins Reached (leave unused spaces Begins Ends blank) Chamber Pressure (ATA 1 2 2 2 2 2  --2 1 ) Clock Time (24 hr) 08:35 08:50 09:20 09:25 09:55 10:00 - - 10:30 10:43 Treatment Length: 128 (minutes) Treatment Segments: 4 Vital Signs Capillary Blood Glucose Reference Range: 80 - 120 mg / dl HBO Diabetic Blood Glucose Intervention Range: <131 mg/dl or mg/dl Time Vitals Blood Respiratory Capillary Blood Glucose Pulse Action Type: Pulse: Temperature: Taken: Pressure: Rate: Glucose (mg/dl): Meter #: Oximetry (%) Taken: Pre 08:28 129/77 78 16 97.7 155 Post 10:44 149/100 68 16 97.5 146 Treatment Response Treatment Toleration: Well Treatment Completion Status: Treatment Completed without Adverse Event Additional Procedure Documentation Tissue Sevierity: Necrosis of bone Physician  HBO Attestation: I certify that I supervised this HBO treatment in accordance with Medicare guidelines. A trained emergency response team is readily available per Yes hospital policies and procedures. Continue HBOT as ordered. Yes Electronic Signature(s) Signed: 01/25/2022 11:46:43 AM By: 01/27/2022 MD FACS Previous Signature: 01/25/2022 11:40:39 AM Version By: 01/27/2022 EMT Previous Signature: 01/25/2022 9:17:33 AM Version By: 01/27/2022 EMT Entered By: [name] on 01/25/2022 11:46:42 -------------------------------------------------------------------------------- HBO Safety Checklist Details Patient Name: Date of Service: NA 01/27/2022 [name] 01/25/2022 8:00 A M Medical Record Number: 01/27/2022 Patient Account Number: [id number] Date of Birth/Sex: Treating RN: 1955-08-04 (66 y.o. 77 Primary Care [name]: [name] Other Clinician: [name] Referring [name]: Treating [name]/Extender: [name] Weeks in Treatment: 21 HBO Safety Checklist Items Safety Checklist Consent Form Signed Patient voided / foley secured and emptied When did you last eato 0645 Last dose of injectable or oral agent 0645 Ostomy pouch emptied and vented if applicable NA All implantable devices assessed, documented and approved NA Intravenous access site secured and place NA Valuables secured Linens and cotton and cotton/polyester blend (less than 51% polyester) Personal oil-based products / skin lotions / body lotions removed Wigs or hairpieces removed NA Smoking or tobacco materials removed Books / newspapers / magazines / loose paper removed Cologne, aftershave, perfume and deodorant removed Jewelry removed (may wrap wedding band) Make-up removed NA Hair care products removed Battery operated devices (external) removed Heating patches and chemical warmers removed Titanium eyewear removed NA Nail polish cured greater than 10  hours NA Casting material cured greater than 10 hours NA Hearing aids removed NA Loose dentures or partials removed NA Prosthetics have been removed NA Patient demonstrates correct use of air break device (if applicable) Patient concerns have been addressed Patient grounding bracelet on and cord attached to  chamber Specifics for Inpatients (complete in addition to above) Medication sheet sent with patient NA Intravenous medications needed or due during therapy sent with patient NA Drainage tubes (e.g. nasogastric tube or chest tube secured and vented) NA Endotracheal or Tracheotomy tube secured NA Cuff deflated of air and inflated with saline NA Airway suctioned NA Notes The safety checklist was done before the treatment was started. Electronic Signature(s) Signed: 01/25/2022 9:16:13 AM By: [name] EMT Entered By: [name] on 01/25/2022 09:16:12

## 2022-01-28 NOTE — Progress Notes (Signed)
[name], [name] ([id number]) Visit Report for 01/28/2022 Arrival Information Details Patient Name: Date of Service: NA Colin Nephew A. 01/28/2022 8:00 A M Medical Record Number: [id number] Patient Account Number: [id number] Date of Birth/Sex: Treating RN: 19-Dec-1955 (66 y.o. [name] Primary Care Samual Beals: [name] Other Clinician: [name] Referring Adden Strout: Treating Zyanna Leisinger/Extender: [name] in Treatment: 22 Visit Information History Since Last Visit All ordered tests and consults were completed: Yes Patient Arrived: Knee Scooter Added or deleted any medications: No Arrival Time: 08:09 Any new allergies or adverse reactions: No Accompanied By: Wife Had a fall or experienced change in No Transfer Assistance: None activities of daily living that may affect Patient Identification Verified: Yes risk of falls: Secondary Verification Process Completed: Yes Signs or symptoms of abuse/neglect since last visito No Patient Requires Transmission-Based Precautions: No Hospitalized since last visit: No Patient Has Alerts: Yes Implantable device outside of the clinic excluding No Patient Alerts: Patient on Blood Thinner cellular tissue based products placed in the center since last visit: Pain Present Now: No Electronic Signature(s) Signed: 01/28/2022 8:47:53 AM By: [name] EMT Entered By: [name] on 01/28/2022 08:47:53 -------------------------------------------------------------------------------- Encounter Discharge Information Details Patient Name: Date of Service: NA Colin Colin Aris [name] 01/28/2022 8:00 A M Medical Record Number: [id number] Patient Account Number: [id number] Date of Birth/Sex: Treating RN: 1955-10-24 (66 y.o. [name] Primary Care Cuahutemoc Attar: [name] Other Clinician: [name] Referring Isatu Macinnes: Treating Wadell Craddock/Extender: [name]: 22 Encounter  Discharge Information Items Discharge Condition: Stable Ambulatory Status: Knee Scooter Discharge Destination: Home Transportation: Private Auto Accompanied By: None Schedule Follow-up Appointment: Yes Clinical Summary of Care: Electronic Signature(s) Signed: 01/28/2022 10:46:14 AM By: [name] EMT Entered By: [name] on 01/28/2022 10:46:13 -------------------------------------------------------------------------------- Vitals Details Patient Name: Date of Service: NA [name], [name] 01/28/2022 8:00 A M Medical Record Number: [id number] Patient Account Number: [id number] Date of Birth/Sex: Treating RN: May 12, 1956 (66 y.o. [name] Primary Care Aracelly Tencza: [name] Other Clinician: [name] Referring Kwasi Joung: Treating Madellyn Denio/Extender: [name]: 22 Vital Signs Time Taken: 08:25 Temperature (F): 98.1 Height (in): 74 Pulse (bpm): 75 Weight (lbs): 186 Respiratory Rate (breaths/min): 18 Body Mass Index (BMI): 23.9 Blood Pressure (mmHg): 139/75 Capillary Blood Glucose (mg/dl): 681 Reference Range: 80 - 120 mg / dl Electronic Signature(s) Signed: 01/28/2022 8:48:22 AM By: [name] EMT Entered By: [name] on 01/28/2022 08:48:22

## 2022-01-28 NOTE — Progress Notes (Signed)
[name], [name] ([id number]) Visit Report for 01/25/2022 Problem List Details Patient Name: Date of Service: NA [name] 01/25/2022 8:00 A M Medical Record Number: [id number] Patient Account Number: [id number] Date of Birth/Sex: Treating RN: 21-Jul-1955 (66 y.o. 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 01/25/2022 Medical Record Number: [id number] Patient Account Number: [id number] Date of Birth/Sex: Treating RN: [birth year] (66 y.o. 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 Facility Procedures CPT4 Code: 01601093 Description: G0277-(Facility Use Only) HBOT full body chamber, , ICD-10 Diagnosis Description E11.621 Type 2 diabetes mellitus with foot ulcer 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 M86.672 Other chronic  osteomyelitis, left ankle and foot 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

## 2022-01-29 DIAGNOSIS — E1140 Type 2 diabetes mellitus with diabetic neuropathy, unspecified: Secondary | ICD-10-CM | POA: Diagnosis not present

## 2022-01-29 DIAGNOSIS — E119 Type 2 diabetes mellitus without complications: Secondary | ICD-10-CM | POA: Diagnosis not present

## 2022-01-29 DIAGNOSIS — M159 Polyosteoarthritis, unspecified: Secondary | ICD-10-CM | POA: Diagnosis not present

## 2022-01-29 DIAGNOSIS — R5382 Chronic fatigue, unspecified: Secondary | ICD-10-CM | POA: Diagnosis not present

## 2022-01-29 DIAGNOSIS — E7800 Pure hypercholesterolemia, unspecified: Secondary | ICD-10-CM | POA: Diagnosis not present

## 2022-01-29 DIAGNOSIS — T8131XA Disruption of external operation (surgical) wound, not elsewhere classified, initial encounter: Secondary | ICD-10-CM | POA: Diagnosis not present

## 2022-01-29 DIAGNOSIS — I10 Essential (primary) hypertension: Secondary | ICD-10-CM | POA: Diagnosis not present

## 2022-01-30 DIAGNOSIS — T8131XA Disruption of external operation (surgical) wound, not elsewhere classified, initial encounter: Secondary | ICD-10-CM | POA: Diagnosis not present

## 2022-01-31 ENCOUNTER — Encounter (HOSPITAL_BASED_OUTPATIENT_CLINIC_OR_DEPARTMENT_OTHER): Payer: BC Managed Care – PPO | Admitting: General Surgery

## 2022-01-31 DIAGNOSIS — E1151 Type 2 diabetes mellitus with diabetic peripheral angiopathy without gangrene: Secondary | ICD-10-CM | POA: Diagnosis not present

## 2022-01-31 DIAGNOSIS — T8131XA Disruption of external operation (surgical) wound, not elsewhere classified, initial encounter: Secondary | ICD-10-CM | POA: Diagnosis not present

## 2022-01-31 DIAGNOSIS — E11610 Type 2 diabetes mellitus with diabetic neuropathic arthropathy: Secondary | ICD-10-CM | POA: Diagnosis not present

## 2022-01-31 DIAGNOSIS — E11621 Type 2 diabetes mellitus with foot ulcer: Secondary | ICD-10-CM | POA: Diagnosis not present

## 2022-01-31 DIAGNOSIS — E1140 Type 2 diabetes mellitus with diabetic neuropathy, unspecified: Secondary | ICD-10-CM | POA: Diagnosis not present

## 2022-01-31 DIAGNOSIS — L97324 Non-pressure chronic ulcer of left ankle with necrosis of bone: Secondary | ICD-10-CM | POA: Diagnosis not present

## 2022-01-31 DIAGNOSIS — M86672 Other chronic osteomyelitis, left ankle and foot: Secondary | ICD-10-CM | POA: Diagnosis not present

## 2022-01-31 DIAGNOSIS — Z951 Presence of aortocoronary bypass graft: Secondary | ICD-10-CM | POA: Diagnosis not present

## 2022-01-31 DIAGNOSIS — I2510 Atherosclerotic heart disease of native coronary artery without angina pectoris: Secondary | ICD-10-CM | POA: Diagnosis not present

## 2022-01-31 LAB — GLUCOSE, CAPILLARY
Glucose-Capillary: 121 mg/dL — ABNORMAL HIGH (ref 70–99)
Glucose-Capillary: 90 mg/dL (ref 70–99)
Glucose-Capillary: 132 mg/dL — ABNORMAL HIGH (ref 70–99)

## 2022-01-31 NOTE — Progress Notes (Signed)
DURIEL, DEERY ([id number]) Visit Report for 01/31/2022 Arrival Information Details Patient Name: Date of Service: NA [name] 01/31/2022 8:00 A M Medical Record Number: [id number] Patient Account Number: [id number] Date of Birth/Sex: Treating RN: July 09, 1955 (66 y.o. [name] Primary Care Sindia Kowalczyk: [name] Other Clinician: [name] Referring Sevin Langenbach: Treating Purvis Sidle/Extender: [name] in Treatment: 22 Visit Information History Since Last Visit All ordered tests and consults were completed: Yes Patient Arrived: Knee Scooter Added or deleted any medications: No Arrival Time: 08:10 Any new allergies or adverse reactions: No Accompanied By: Wife Had a fall or experienced change in No Transfer Assistance: None activities of daily living that may affect Patient Identification Verified: Yes risk of falls: Secondary Verification Process Completed: Yes Signs or symptoms of abuse/neglect since last visito No Patient Requires Transmission-Based Precautions: No Hospitalized since last visit: No Patient Has Alerts: Yes Implantable device outside of the clinic excluding No Patient Alerts: Patient on Blood Thinner cellular tissue based products placed in the center since last visit: Pain Present Now: No Electronic Signature(s) Signed: 01/31/2022 11:29:24 AM By: [name] EMT Entered By: [name] on 01/31/2022 11:29:23 -------------------------------------------------------------------------------- Encounter Discharge Information Details Patient Name: Date of Service: NA [name] 01/31/2022 8:00 A M Medical Record Number: [id number] Patient Account Number: [id number] Date of Birth/Sex: Treating RN: Jul 01, 1955 (65 y.o. [name] Primary Care Maxie Debose: [name] Other Clinician: [name] Referring Janette Harvie: Treating Makael Stein/Extender: [name]: 22 Encounter  Discharge Information Items Discharge Condition: Stable Ambulatory Status: Knee Scooter Discharge Destination: Home Transportation: Private Auto Accompanied By: None Schedule Follow-up Appointment: Yes Clinical Summary of Care: Electronic Signature(s) Signed: 01/31/2022 11:41:28 AM By: [name] EMT Entered By: [name] on 01/31/2022 11:41:28 -------------------------------------------------------------------------------- Vitals Details Patient Name: Date of Service: NA [name] 01/31/2022 8:00 A M Medical Record Number: [id number] Patient Account Number: [id number] Date of Birth/Sex: Treating RN: November 30, 1955 (65 y.o. [name] Primary Care Carine Nordgren: [name] Other Clinician: [name] Referring Shayan Bramhall: Treating Juliyah Mergen/Extender: [name]: 22 Vital Signs Time Taken: 08:30 Temperature (F): 97.5 Height (in): 74 Pulse (bpm): 68 Weight (lbs): 186 Respiratory Rate (breaths/min): 16 Body Mass Index (BMI): 23.9 Blood Pressure (mmHg): 125/81 Capillary Blood Glucose (mg/dl): 90 Reference Range: 80 - 120 mg / dl Electronic Signature(s) Signed: 01/31/2022 11:30:13 AM By: [name] EMT Entered By: [name] on 01/31/2022 11:30:12

## 2022-01-31 NOTE — Progress Notes (Addendum)
[name], [name] ([id number]) Visit Report for 01/31/2022 HBO Details Patient Name: Date of Service: NA [name] 01/31/2022 8:00 A M Medical Record Number: [id number] Patient Account Number: [id number] Date of Birth/Sex: Treating RN: [birth year] (66 y.o. [name] Primary Care Blayne Frankie: [name] Other Clinician: [name] Referring Eliav Mechling: Treating Jadae Steinke/Extender: [name]: 22 HBO Treatment Course Details Treatment Course Number: 1 Ordering Jullien Granquist: [name] T Treatments Ordered: otal 120 HBO Treatment Start Date: 09/20/2021 HBO Indication: Diabetic Ulcer(s) of the Lower Extremity Standard/Conservative Wound Care tried and failed greater than or equal to 30 days HBO Treatment Details Treatment Number: 87 Patient Type: Outpatient Chamber Type: Monoplace Chamber Serial #: [id number] Treatment Protocol: 2.0 ATA with 90 minutes oxygen, with two 5 minute air breaks Treatment Details Compression Rate Down: 2.0 psi / minute De-Compression Rate Up: 2.0 psi / minute A breaks and breathing ir Compress Tx Pressure periods Decompress Decompress Begins Reached (leave unused spaces Begins Ends blank) Chamber Pressure (ATA 1 2 2 2 2 2  --2 1 ) Clock Time (24 hr) 09:03 09:17 09:47 09:52 10:22 10:27 - - 10:57 11:08 Treatment Length: 125 (minutes) Treatment Segments: 4 Vital Signs Capillary Blood Glucose Reference Range: 80 - 120 mg / dl HBO Diabetic Blood Glucose Intervention Range: <131 mg/dl or mg/dl Type: Time Vitals Blood Pulse: Respiratory Capillary Blood Glucose Pulse Action Temperature: Taken: Pressure: Rate: Glucose (mg/dl): Meter #: Oximetry (%) Taken: Pre 08:30 125/81 68 16 97.5 90 Patient given 8 oz glucerna Post 11:12 163/95 670 18 97.4 132 Treatment Response Treatment Toleration: Well Treatment Completion Status: Treatment Completed without Adverse Event Treatment Notes The patient Blood Sugar  was 90, and the patient was given 8 oz Glucerna to drink. At 0856 his blood sugar recheck was 121. I informed Dr. >557. She ordered that the patient was to get 8 oz Glucerna, and to start treatment. Additional Procedure Documentation Tissue Sevierity: Necrosis of bone Physician HBO Attestation: I certify that I supervised this HBO treatment in accordance with Medicare guidelines. A trained emergency response team is readily available per Yes hospital policies and procedures. Continue HBOT as ordered. Yes Electronic Signature(s) Signed: 01/31/2022 11:59:31 AM By: 02/02/2022 MD FACS Previous Signature: 01/31/2022 11:38:05 AM Version By: 02/02/2022 EMT Entered By: [name] on 01/31/2022 11:59:30 -------------------------------------------------------------------------------- HBO Safety Checklist Details Patient Name: Date of Service: 02/02/2022 A. 01/31/2022 8:00 A M Medical Record Number: 02/02/2022 Patient Account Number: [id number] Date of Birth/Sex: Treating RN: 24-Dec-1955 (66 y.o. 79 Primary Care Deadra Diggins: [name] Other Clinician: [name] Referring Faelynn Wynder: Treating James Senn/Extender: [name]: 22 HBO Safety Checklist Items Safety Checklist Consent Form Signed Patient voided / foley secured and emptied When did you last eato 0645 Last dose of injectable or oral agent 0645 Ostomy pouch emptied and vented if applicable NA All implantable devices assessed, documented and approved NA Intravenous access site secured and place NA Valuables secured Linens and cotton and cotton/polyester blend (less than 51% polyester) Personal oil-based products / skin lotions / body lotions removed Wigs or hairpieces removed NA Smoking or tobacco materials removed Books / newspapers / magazines / loose paper removed Cologne, aftershave, perfume and deodorant removed Jewelry removed (may wrap wedding  band) NA Make-up removed NA Hair care products removed Battery operated devices (external) removed Heating patches and chemical warmers removed Titanium eyewear removed NA Nail polish cured greater than 10 hours NA Casting  material cured greater than 10 hours NA Hearing aids removed NA Loose dentures or partials removed NA Prosthetics have been removed NA Patient demonstrates correct use of air break device (if applicable) Patient concerns have been addressed Patient grounding bracelet on and cord attached to chamber Specifics for Inpatients (complete in addition to above) Medication sheet sent with patient NA Intravenous medications needed or due during therapy sent with patient NA Drainage tubes (e.g. nasogastric tube or chest tube secured and vented) NA Endotracheal or Tracheotomy tube secured NA Cuff deflated of air and inflated with saline NA Airway suctioned NA Notes The safety checklist was done before the treatment was started. Electronic Signature(s) Signed: 01/31/2022 11:31:25 AM By: [name] EMT Entered By: [name] on 01/31/2022 11:31:25

## 2022-01-31 NOTE — Progress Notes (Signed)
[name], [name] ([id number]) Visit Report for 01/31/2022 Problem List Details Patient Name: Date of Service: NA [name] 01/31/2022 8:00 A M Medical Record Number: [id number] Patient Account Number: [id number] Date of Birth/Sex: Treating RN: February 03, 1956 (66 y.o. 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 01/31/2022 Medical Record Number: [id number] Patient Account Number: [id number] Date of Birth/Sex: Treating RN: 12-14-1955 (66 y.o. 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 Facility Procedures CPT4 Code: 37902409 Description: G0277-(Facility Use Only) HBOT full body chamber, , ICD-10 Diagnosis Description E11.621 Type 2 diabetes mellitus with foot ulcer 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 M86.672 Other chronic  osteomyelitis, left ankle and foot 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

## 2022-02-01 ENCOUNTER — Encounter (HOSPITAL_BASED_OUTPATIENT_CLINIC_OR_DEPARTMENT_OTHER): Payer: BC Managed Care – PPO | Admitting: General Surgery

## 2022-02-01 DIAGNOSIS — T8131XA Disruption of external operation (surgical) wound, not elsewhere classified, initial encounter: Secondary | ICD-10-CM | POA: Diagnosis not present

## 2022-02-02 ENCOUNTER — Encounter (HOSPITAL_BASED_OUTPATIENT_CLINIC_OR_DEPARTMENT_OTHER): Payer: BC Managed Care – PPO | Admitting: General Surgery

## 2022-02-02 DIAGNOSIS — L97324 Non-pressure chronic ulcer of left ankle with necrosis of bone: Secondary | ICD-10-CM | POA: Diagnosis not present

## 2022-02-02 DIAGNOSIS — Z951 Presence of aortocoronary bypass graft: Secondary | ICD-10-CM | POA: Diagnosis not present

## 2022-02-02 DIAGNOSIS — E1151 Type 2 diabetes mellitus with diabetic peripheral angiopathy without gangrene: Secondary | ICD-10-CM | POA: Diagnosis not present

## 2022-02-02 DIAGNOSIS — I2510 Atherosclerotic heart disease of native coronary artery without angina pectoris: Secondary | ICD-10-CM | POA: Diagnosis not present

## 2022-02-02 DIAGNOSIS — E1140 Type 2 diabetes mellitus with diabetic neuropathy, unspecified: Secondary | ICD-10-CM | POA: Diagnosis not present

## 2022-02-02 DIAGNOSIS — T8131XA Disruption of external operation (surgical) wound, not elsewhere classified, initial encounter: Secondary | ICD-10-CM | POA: Diagnosis not present

## 2022-02-02 DIAGNOSIS — M86672 Other chronic osteomyelitis, left ankle and foot: Secondary | ICD-10-CM | POA: Diagnosis not present

## 2022-02-02 DIAGNOSIS — E11621 Type 2 diabetes mellitus with foot ulcer: Secondary | ICD-10-CM | POA: Diagnosis not present

## 2022-02-02 DIAGNOSIS — E11610 Type 2 diabetes mellitus with diabetic neuropathic arthropathy: Secondary | ICD-10-CM | POA: Diagnosis not present

## 2022-02-02 LAB — GLUCOSE, CAPILLARY
Glucose-Capillary: 227 mg/dL — ABNORMAL HIGH (ref 70–99)
Glucose-Capillary: 279 mg/dL — ABNORMAL HIGH (ref 70–99)

## 2022-02-02 NOTE — Progress Notes (Addendum)
ADEBAYO, ENSMINGER ([id number]) Visit Report for 02/02/2022 Arrival Information Details Patient Name: Date of Service: NA [name] 02/02/2022 8:00 A M Medical Record Number: [id number] Patient Account Number: [id number] Date of Birth/Sex: Treating RN: [birth year] (66 y.o. [name] Primary Care Jarris Kortz: [name] Other Clinician: [name] Referring Doretha Goding: Treating Amanat Hackel/Extender: [name] in Treatment: 23 Visit Information History Since Last Visit All ordered tests and consults were completed: Yes Patient Arrived: Knee Scooter Added or deleted any medications: No Arrival Time: 08:06 Any new allergies or adverse reactions: No Accompanied By: Wife Had a fall or experienced change in No Transfer Assistance: None activities of daily living that may affect Patient Identification Verified: Yes risk of falls: Secondary Verification Process Completed: Yes Signs or symptoms of abuse/neglect since last visito No Patient Requires Transmission-Based Precautions: No Hospitalized since last visit: No Patient Has Alerts: Yes Implantable device outside of the clinic excluding No Patient Alerts: Patient on Blood Thinner cellular tissue based products placed in the center since last visit: Pain Present Now: No Electronic Signature(s) Signed: 02/02/2022 9:06:03 AM By: [name] EMT Entered By: [name] on 02/02/2022 09:06:02 -------------------------------------------------------------------------------- Encounter Discharge Information Details Patient Name: Date of Service: [name] 02/02/2022 8:00 A M Medical Record Number: [id number] Patient Account Number: [id number] Date of Birth/Sex: Treating RN: 05-22-1956 (66 y.o. [name] Primary Care Oluwatimileyin Vivier: [name] Other Clinician: [name] Referring Shahmeer Bunn: Treating Christine Schiefelbein/Extender: [name]: 23 Encounter Discharge  Information Items Discharge Condition: Stable Ambulatory Status: Knee Scooter Discharge Destination: Home Transportation: Private Auto Accompanied By: Wife Schedule Follow-up Appointment: Yes Clinical Summary of Care: Electronic Signature(s) Signed: 02/02/2022 11:06:39 AM By: [name] EMT Entered By: [name] on 02/02/2022 11:06:38 -------------------------------------------------------------------------------- Vitals Details Patient Name: Date of Service: [name] 02/02/2022 8:00 A M Medical Record Number: [id number] Patient Account Number: [id number] Date of Birth/Sex: Treating RN: [birth year] (66 y.o. [name] Primary Care Gladys Deckard: [name] Other Clinician: [name] Referring Seng Fouts: Treating Arwilda Georgia/Extender: [name]: 23 Vital Signs Time Taken: 08:23 Temperature (F): 97.3 Height (in): 74 Pulse (bpm): 79 Weight (lbs): 186 Respiratory Rate (breaths/min): 16 Body Mass Index (BMI): 23.9 Blood Pressure (mmHg): 123/72 Capillary Blood Glucose (mg/dl): 007 Reference Range: 80 - 120 mg / dl Electronic Signature(s) Signed: 02/02/2022 9:06:30 AM By: [name] EMT Entered By: [name] on 02/02/2022 09:06:30

## 2022-02-02 NOTE — Progress Notes (Addendum)
[name], Colin ([id number]) Visit Report for 02/02/2022 HBO Details Patient Name: Date of Service: NA [name] 02/02/2022 8:00 A M Medical Record Number: [id number] Patient Account Number: [id number] Date of Birth/Sex: Treating RN: 14-Apr-1956 (66 y.o. Valma [name] Primary Care Keisy Strickler: [name] Other Clinician: [name] Referring Bettyanne Dittman: Treating Arie Gable/Extender: [name]: 23 HBO Treatment Course Details Treatment Course Number: 1 Ordering Lutie Pickler: [name] T Treatments Ordered: otal 120 HBO Treatment Start Date: 09/20/2021 HBO Indication: Diabetic Ulcer(s) of the Lower Extremity Standard/Conservative Wound Care tried and failed greater than or equal to 30 days HBO Treatment Details Treatment Number: 88 Patient Type: Outpatient Chamber Type: Monoplace Chamber Serial #: [id number] Treatment Protocol: 2.0 ATA with 90 minutes oxygen, with two 5 minute air breaks Treatment Details Compression Rate Down: 2.0 psi / minute De-Compression Rate Up: A breaks and breathing ir Compress Tx Pressure periods Decompress Decompress Begins Reached (leave unused spaces Begins Ends blank) Chamber Pressure (ATA 1 2 2 2 2 2  --2 1 ) Clock Time (24 hr) 08:29 08:42 09:12 09:18 09:48 09:53 - - 10:23 10:33 Treatment Length: 124 (minutes) Treatment Segments: 4 Vital Signs Capillary Blood Glucose Reference Range: 80 - 120 mg / dl HBO Diabetic Blood Glucose Intervention Range: <131 mg/dl or mg/dl Time Vitals Blood Respiratory Capillary Blood Glucose Pulse Action Type: Pulse: Temperature: Taken: Pressure: Rate: Glucose (mg/dl): Meter #: Oximetry (%) Taken: Pre 08:23 123/72 79 16 97.3 227 Post 10:37 165/89 68 16 97.4 279 Treatment Response Treatment Toleration: Well Treatment Completion Status: Treatment Completed without Adverse Event Treatment Notes Dr. >425 informed of the patient blood sugar pre and  post. Additional Procedure Documentation Tissue Sevierity: Necrosis of bone Physician HBO Attestation: I certify that I supervised this HBO treatment in accordance with Medicare guidelines. A trained emergency response team is readily available per Yes hospital policies and procedures. Continue HBOT as ordered. Yes Electronic Signature(s) Signed: 02/02/2022 11:09:20 AM By: 02/04/2022 MD FACS Previous Signature: 02/02/2022 11:03:55 AM Version By: 02/04/2022 EMT Previous Signature: 02/02/2022 9:08:25 AM Version By: 02/04/2022 EMT Entered By: [name] on 02/02/2022 11:09:20 -------------------------------------------------------------------------------- HBO Safety Checklist Details Patient Name: Date of Service: 02/04/2022 A. 02/02/2022 8:00 A M Medical Record Number: 02/04/2022 Patient Account Number: [id number] Date of Birth/Sex: Treating RN: 1956-01-01 (66 y.o. 77 Primary Care Faithanne Verret: Valma [name] Other Clinician: [name] Referring Bueford Arp: Treating Malayiah Mcbrayer/Extender: [name]: 23 HBO Safety Checklist Items Safety Checklist Consent Form Signed Patient voided / foley secured and emptied When did you last eato 0700 Last dose of injectable or oral agent 0700 Ostomy pouch emptied and vented if applicable NA All implantable devices assessed, documented and approved NA Intravenous access site secured and place NA Valuables secured Linens and cotton and cotton/polyester blend (less than 51% polyester) Personal oil-based products / skin lotions / body lotions removed Wigs or hairpieces removed NA Smoking or tobacco materials removed Books / newspapers / magazines / loose paper removed Cologne, aftershave, perfume and deodorant removed Jewelry removed (may wrap wedding band) NA Make-up removed NA Hair care products removed Battery operated devices (external) removed Heating patches and  chemical warmers removed Titanium eyewear removed NA Nail polish cured greater than 10 hours NA Casting material cured greater than 10 hours NA Hearing aids removed NA Loose dentures or partials removed NA Prosthetics have been removed NA Patient demonstrates correct use of air break device (if  applicable) Patient concerns have been addressed Patient grounding bracelet on and cord attached to chamber Specifics for Inpatients (complete in addition to above) Medication sheet sent with patient NA Intravenous medications needed or due during therapy sent with patient NA Drainage tubes (e.g. nasogastric tube or chest tube secured and vented) NA Endotracheal or Tracheotomy tube secured NA Cuff deflated of air and inflated with saline NA Airway suctioned NA Notes The safety checklist was done before the treatment was started Electronic Signature(s) Signed: 02/02/2022 9:07:34 AM By: [name] EMT Entered By: [name] on 02/02/2022 09:07:34

## 2022-02-02 NOTE — Progress Notes (Signed)
[name], [name] ([id number]) Visit Report for 02/02/2022 Problem List Details Patient Name: Date of Service: NA [name] 02/02/2022 8:00 A M Medical Record Number: [id number] Patient Account Number: [id number] Date of Birth/Sex: Treating RN: December 31, 1955 (66 y.o. 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 02/02/2022 Medical Record Number: [id number] Patient Account Number: [id number] Date of Birth/Sex: Treating RN: [birth year] (66 y.o. 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 Facility Procedures CPT4 Code: 45038882 Description: G0277-(Facility Use Only) HBOT full body chamber, , ICD-10 Diagnosis Description E11.621 Type 2 diabetes mellitus with foot ulcer 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 M86.672 Other chronic  osteomyelitis, left ankle and foot 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

## 2022-02-03 ENCOUNTER — Encounter (HOSPITAL_BASED_OUTPATIENT_CLINIC_OR_DEPARTMENT_OTHER): Payer: BC Managed Care – PPO | Admitting: General Surgery

## 2022-02-03 DIAGNOSIS — Z951 Presence of aortocoronary bypass graft: Secondary | ICD-10-CM | POA: Diagnosis not present

## 2022-02-03 DIAGNOSIS — E1151 Type 2 diabetes mellitus with diabetic peripheral angiopathy without gangrene: Secondary | ICD-10-CM | POA: Diagnosis not present

## 2022-02-03 DIAGNOSIS — E1140 Type 2 diabetes mellitus with diabetic neuropathy, unspecified: Secondary | ICD-10-CM | POA: Diagnosis not present

## 2022-02-03 DIAGNOSIS — E11610 Type 2 diabetes mellitus with diabetic neuropathic arthropathy: Secondary | ICD-10-CM | POA: Diagnosis not present

## 2022-02-03 DIAGNOSIS — E11621 Type 2 diabetes mellitus with foot ulcer: Secondary | ICD-10-CM | POA: Diagnosis not present

## 2022-02-03 DIAGNOSIS — I2510 Atherosclerotic heart disease of native coronary artery without angina pectoris: Secondary | ICD-10-CM | POA: Diagnosis not present

## 2022-02-03 DIAGNOSIS — L97324 Non-pressure chronic ulcer of left ankle with necrosis of bone: Secondary | ICD-10-CM | POA: Diagnosis not present

## 2022-02-03 DIAGNOSIS — M86672 Other chronic osteomyelitis, left ankle and foot: Secondary | ICD-10-CM | POA: Diagnosis not present

## 2022-02-03 DIAGNOSIS — T8131XA Disruption of external operation (surgical) wound, not elsewhere classified, initial encounter: Secondary | ICD-10-CM | POA: Diagnosis not present

## 2022-02-03 LAB — GLUCOSE, CAPILLARY
Glucose-Capillary: 171 mg/dL — ABNORMAL HIGH (ref 70–99)
Glucose-Capillary: 121 mg/dL — ABNORMAL HIGH (ref 70–99)

## 2022-02-03 NOTE — Progress Notes (Addendum)
KAIEL, WEIDE ([id number]) Visit Report for 02/03/2022 Arrival Information Details Patient Name: Date of Service: NA [name] 02/03/2022 8:00 A M Medical Record Number: [id number] Patient Account Number: [id number] Date of Birth/Sex: Treating RN: [birth year] (66 y.o. [name] Primary Care Bryttney Netzer: [name] Other Clinician: [name] Referring Matthan Sledge: Treating Brittainy Bucker/Extender: [name] in Treatment: 23 Visit Information History Since Last Visit All ordered tests and consults were completed: Yes Patient Arrived: Knee Scooter Added or deleted any medications: No Arrival Time: 08:14 Any new allergies or adverse reactions: No Accompanied By: Wife Had a fall or experienced change in No Transfer Assistance: None activities of daily living that may affect Patient Identification Verified: Yes risk of falls: Secondary Verification Process Completed: Yes Signs or symptoms of abuse/neglect since last visito No Patient Requires Transmission-Based Precautions: No Hospitalized since last visit: No Patient Has Alerts: Yes Implantable device outside of the clinic excluding No Patient Alerts: Patient on Blood Thinner cellular tissue based products placed in the center since last visit: Pain Present Now: No Electronic Signature(s) Signed: 02/03/2022 9:25:55 AM By: [name] EMT Entered By: [name] on 02/03/2022 09:25:55 -------------------------------------------------------------------------------- Encounter Discharge Information Details Patient Name: Date of Service: [name] 02/03/2022 8:00 A M Medical Record Number: [id number] Patient Account Number: [id number] Date of Birth/Sex: Treating RN: 03-21-1956 (66 y.o. [name] Primary Care Kirt Chew: [name] Other Clinician: [name] Referring Tajae Maiolo: Treating Vadim Centola/Extender: [name]: 23 Encounter  Discharge Information Items Discharge Condition: Stable Ambulatory Status: Knee Scooter Discharge Destination: Home Transportation: Private Auto Accompanied By: Wife Schedule Follow-up Appointment: Yes Clinical Summary of Care: Electronic Signature(s) Signed: 02/03/2022 11:09:30 AM By: [name] EMT Entered By: [name] on 02/03/2022 11:09:30 -------------------------------------------------------------------------------- Vitals Details Patient Name: Date of Service: [name] 02/03/2022 8:00 A M Medical Record Number: [id number] Patient Account Number: [id number] Date of Birth/Sex: Treating RN: 07-Oct-1955 (66 y.o. [name] Primary Care Daley Gosse: [name] Other Clinician: [name] Referring Lanah Steines: Treating Tamakia Porto/Extender: [name]: 23 Vital Signs Time Taken: 08:42 Temperature (F): 97.3 Height (in): 74 Pulse (bpm): 71 Weight (lbs): 186 Respiratory Rate (breaths/min): 16 Body Mass Index (BMI): 23.9 Blood Pressure (mmHg): 141/85 Capillary Blood Glucose (mg/dl): 154 Reference Range: 80 - 120 mg / dl Electronic Signature(s) Signed: 02/03/2022 9:26:26 AM By: [name] EMT Entered By: [name] on 02/03/2022 09:26:26

## 2022-02-03 NOTE — Progress Notes (Signed)
[name], [name] ([id number]) Visit Report for 02/03/2022 Problem List Details Patient Name: Date of Service: NA [name] 02/03/2022 8:00 A M Medical Record Number: [id number] Patient Account Number: [id number] Date of Birth/Sex: Treating RN: [birth year] (66 y.o. 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 02/03/2022 Medical Record Number: [id number] Patient Account Number: [id number] Date of Birth/Sex: Treating RN: [birth year] (66 y.o. 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 Facility Procedures CPT4 Code: 77034035 Description: G0277-(Facility Use Only) HBOT full body chamber, , ICD-10 Diagnosis Description E11.621 Type 2 diabetes mellitus with foot ulcer 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 M86.672 Other chronic  osteomyelitis, left ankle and foot 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

## 2022-02-03 NOTE — Progress Notes (Addendum)
BARTLETT, ENKE ([id number]) Visit Report for 02/03/2022 HBO Details Patient Name: Date of Service: NA [name] 02/03/2022 8:00 A M Medical Record Number: [id number] Patient Account Number: [id number] Date of Birth/Sex: Treating RN: August 18, 1955 (66 y.o. [name] Primary Care Shavana Calder: [name] Other Clinician: [name] Referring Bubber Rothert: Treating Vira Chaplin/Extender: [name]: 23 HBO Treatment Course Details Treatment Course Number: 1 Ordering Annali Lybrand: [name] T Treatments Ordered: otal 120 HBO Treatment Start Date: 09/20/2021 HBO Indication: Diabetic Ulcer(s) of the Lower Extremity Standard/Conservative Wound Care tried and failed greater than or equal to 30 days HBO Treatment Details Treatment Number: 89 Patient Type: Outpatient Chamber Type: Monoplace Chamber Serial #: [id number] Treatment Protocol: 2.0 ATA with 90 minutes oxygen, with two 5 minute air breaks Treatment Details A breaks and breathing ir Compress Tx Pressure periods Decompress Decompress Begins Reached (leave unused spaces Begins Ends blank) Chamber Pressure (ATA 1 2 2 2 2 2  --2 1 ) Clock Time (24 hr) 08:46 09:00 09:30 09:35 10:05 10:10 - - 10:40 10:53 Treatment Length: 127 (minutes) Treatment Segments: 4 Vital Signs Capillary Blood Glucose Reference Range: 80 - 120 mg / dl HBO Diabetic Blood Glucose Intervention Range: <131 mg/dl or mg/dl Time Vitals Blood Respiratory Capillary Blood Glucose Pulse Action Type: Pulse: Temperature: Taken: Pressure: Rate: Glucose (mg/dl): Meter #: Oximetry (%) Taken: Pre 08:42 141/85 71 16 97.3 171 Post 10:59 173/88 63 16 98.1 121 Treatment Response Treatment Toleration: Well Treatment Completion Status: Treatment Completed without Adverse Event Additional Procedure Documentation Tissue Sevierity: Necrosis of bone Physician HBO Attestation: I certify that I supervised this HBO treatment in  accordance with Medicare guidelines. A trained emergency response team is readily available per Yes hospital policies and procedures. Continue HBOT as ordered. Yes Electronic Signature(s) Signed: 02/03/2022 12:48:55 PM By: 02/05/2022 MD FACS Previous Signature: 02/03/2022 11:08:07 AM Version By: 02/05/2022 EMT Previous Signature: 02/03/2022 9:28:12 AM Version By: 02/05/2022 EMT Entered By: [name] on 02/03/2022 12:48:55 -------------------------------------------------------------------------------- HBO Safety Checklist Details Patient Name: Date of Service: NA 02/05/2022 A. 02/03/2022 8:00 A M Medical Record Number: 02/05/2022 Patient Account Number: [id number] Date of Birth/Sex: Treating RN: [birth year] (66 y.o. 77 Primary Care Kissa Campoy: [name] Other Clinician: [name] Referring Chales Pelissier: Treating Jahlani Lorentz/Extender: [name]: 23 HBO Safety Checklist Items Safety Checklist Consent Form Signed Patient voided / foley secured and emptied When did you last eato 0645 Last dose of injectable or oral agent 0645 Ostomy pouch emptied and vented if applicable NA All implantable devices assessed, documented and approved NA Intravenous access site secured and place NA Valuables secured Linens and cotton and cotton/polyester blend (less than 51% polyester) Personal oil-based products / skin lotions / body lotions removed Wigs or hairpieces removed NA Smoking or tobacco materials removed Books / newspapers / magazines / loose paper removed Cologne, aftershave, perfume and deodorant removed Jewelry removed (may wrap wedding band) NA Make-up removed NA Hair care products removed Battery operated devices (external) removed Heating patches and chemical warmers removed Titanium eyewear removed NA Nail polish cured greater than 10 hours NA Casting material cured greater than 10 hours NA Hearing  aids removed NA Loose dentures or partials removed NA Prosthetics have been removed NA Patient demonstrates correct use of air break device (if applicable) Patient concerns have been addressed Patient grounding bracelet on and cord attached to chamber Specifics for Inpatients (complete in addition to above)  Medication sheet sent with patient NA Intravenous medications needed or due during therapy sent with patient NA Drainage tubes (e.g. nasogastric tube or chest tube secured and vented) NA Endotracheal or Tracheotomy tube secured NA Cuff deflated of air and inflated with saline NA Airway suctioned NA Notes The safety checklist was done before the treatment was started Electronic Signature(s) Signed: 02/03/2022 9:27:36 AM By: [name] EMT Entered By: [name] on 02/03/2022 09:27:35

## 2022-02-04 ENCOUNTER — Encounter (HOSPITAL_BASED_OUTPATIENT_CLINIC_OR_DEPARTMENT_OTHER): Payer: BC Managed Care – PPO | Admitting: General Surgery

## 2022-02-04 DIAGNOSIS — E11621 Type 2 diabetes mellitus with foot ulcer: Secondary | ICD-10-CM | POA: Diagnosis not present

## 2022-02-04 DIAGNOSIS — I2510 Atherosclerotic heart disease of native coronary artery without angina pectoris: Secondary | ICD-10-CM | POA: Diagnosis not present

## 2022-02-04 DIAGNOSIS — T8131XA Disruption of external operation (surgical) wound, not elsewhere classified, initial encounter: Secondary | ICD-10-CM | POA: Diagnosis not present

## 2022-02-04 DIAGNOSIS — E1140 Type 2 diabetes mellitus with diabetic neuropathy, unspecified: Secondary | ICD-10-CM | POA: Diagnosis not present

## 2022-02-04 DIAGNOSIS — Z951 Presence of aortocoronary bypass graft: Secondary | ICD-10-CM | POA: Diagnosis not present

## 2022-02-04 DIAGNOSIS — L97324 Non-pressure chronic ulcer of left ankle with necrosis of bone: Secondary | ICD-10-CM | POA: Diagnosis not present

## 2022-02-04 DIAGNOSIS — E11610 Type 2 diabetes mellitus with diabetic neuropathic arthropathy: Secondary | ICD-10-CM | POA: Diagnosis not present

## 2022-02-04 DIAGNOSIS — E1151 Type 2 diabetes mellitus with diabetic peripheral angiopathy without gangrene: Secondary | ICD-10-CM | POA: Diagnosis not present

## 2022-02-04 DIAGNOSIS — M86672 Other chronic osteomyelitis, left ankle and foot: Secondary | ICD-10-CM | POA: Diagnosis not present

## 2022-02-04 LAB — GLUCOSE, CAPILLARY
Glucose-Capillary: 215 mg/dL — ABNORMAL HIGH (ref 70–99)
Glucose-Capillary: 177 mg/dL — ABNORMAL HIGH (ref 70–99)

## 2022-02-04 NOTE — Progress Notes (Addendum)
[name], [name] ([id number]) Visit Report for 02/04/2022 Arrival Information Details Patient Name: Date of Service: NA [name] 02/04/2022 8:00 [name] Medical Record Number: [id number] Patient Account Number: [id number] Date of Birth/Sex: Treating RN: Feb 07, 1956 (66 y.o. [name] Primary Care Geet Hosking: [name] Other Clinician: [name] Referring Iliyah Bui: Treating Perl Folmar/Extender: [name] in Treatment: 23 Visit Information History Since Last Visit All ordered tests and consults were completed: Yes Patient Arrived: Knee Scooter Added or deleted any medications: No Arrival Time: 08:10 Any new allergies or adverse reactions: No Accompanied By: Wife Had [name] fall or experienced change in No Transfer Assistance: None activities of daily living that may affect Patient Identification Verified: Yes risk of falls: Secondary Verification Process Completed: Yes Signs or symptoms of abuse/neglect since last visito No Patient Requires Transmission-Based Precautions: No Hospitalized since last visit: No Patient Has Alerts: Yes Implantable device outside of the clinic excluding No Patient Alerts: Patient on Blood Thinner cellular tissue based products placed in the center since last visit: Pain Present Now: No Electronic Signature(s) Signed: 02/04/2022 8:54:48 AM By: [name] EMT Previous Signature: 02/04/2022 8:54:26 AM Version By: [name] EMT Entered By: [name] on 02/04/2022 08:54:48 -------------------------------------------------------------------------------- Encounter Discharge Information Details Patient Name: Date of Service: NA Colin [name]. 02/04/2022 8:00 [name] Medical Record Number: [id number] Patient Account Number: [id number] Date of Birth/Sex: Treating RN: 12-08-1955 (66 y.o. [name] Primary Care Eloyse Causey: [name] Other Clinician: [name] Referring Melek Pownall: Treating Bunny Kleist/Extender:  [name]: 23 Encounter Discharge Information Items Discharge Condition: Stable Ambulatory Status: Knee Scooter Discharge Destination: Home Transportation: Private Auto Accompanied By: Wife Schedule Follow-up Appointment: Yes Clinical Summary of Care: Electronic Signature(s) Signed: 02/04/2022 12:28:25 PM By: [name] EMT Entered By: [name] on 02/04/2022 12:28:25 -------------------------------------------------------------------------------- Vitals Details Patient Name: Date of Service: NA [name]. 02/04/2022 8:00 [name] Medical Record Number: [id number] Patient Account Number: [id number] Date of Birth/Sex: Treating RN: 1956/05/16 (66 y.o. [name] Primary Care Belem Hintze: [name] Other Clinician: [name] Referring Bryam Taborda: Treating Thaniel Coluccio/Extender: [name]: 23 Vital Signs Time Taken: 08:26 Temperature (F): 97.2 Height (in): 74 Pulse (bpm): 57 Weight (lbs): 186 Respiratory Rate (breaths/min): 18 Body Mass Index (BMI): 23.9 Blood Pressure (mmHg): 116/62 Capillary Blood Glucose (mg/dl): 450 Reference Range: 80 - 120 mg / dl Electronic Signature(s) Signed: 02/04/2022 8:55:58 AM By: [name] EMT Entered By: [name] on 02/04/2022 08:55:58

## 2022-02-04 NOTE — Progress Notes (Addendum)
Colin, [name] ([id number]) Visit Report for 02/04/2022 HBO Details Patient Name: Date of Service: NA [name] 02/04/2022 8:00 A M Medical Record Number: [id number] Patient Account Number: [id number] Date of Birth/Sex: Treating RN: 01/28/1956 (66 y.o. [name] Primary Care Madiline Saffran: [name] Other Clinician: [name] Referring Taydem Cavagnaro: Treating [name]/Extender: [name]: 23 HBO Treatment Course Details Treatment Course Number: 1 Ordering Kateri Balch: [name] T Treatments Ordered: otal 120 HBO Treatment Start Date: 09/20/2021 HBO Indication: Diabetic Ulcer(s) of the Lower Extremity Standard/Conservative Wound Care tried and failed greater than or equal to 30 days HBO Treatment Details Treatment Number: 90 Patient Type: Outpatient Chamber Type: Monoplace Chamber Serial #: [id number] Treatment Protocol: 2.0 ATA with 90 minutes oxygen, with two 5 minute air breaks Treatment Details Compression Rate Down: 2.0 psi / minute De-Compression Rate Up: A breaks and breathing ir Compress Tx Pressure periods Decompress Decompress Begins Reached (leave unused spaces Begins Ends blank) Chamber Pressure (ATA 1 2 2 2 2 2  --2 1 ) Clock Time (24 hr) 08:33 08:47 09:17 09:23 09:53 09:58 - - 10:28 10:39 Treatment Length: 126 (minutes) Treatment Segments: 4 Vital Signs Capillary Blood Glucose Reference Range: 80 - 120 mg / dl HBO Diabetic Blood Glucose Intervention Range: <131 mg/dl or mg/dl Time Vitals Blood Respiratory Capillary Blood Glucose Pulse Action Type: Pulse: Temperature: Taken: Pressure: Rate: Glucose (mg/dl): Meter #: Oximetry (%) Taken: Pre 08:26 116/62 57 18 97.2 215 Post 10:40 162/79 67 16 97.3 177 Treatment Response Treatment Toleration: Well Treatment Completion Status: Treatment Completed without Adverse Event Additional Procedure Documentation Tissue Sevierity: Necrosis of bone Physician HBO  Attestation: I certify that I supervised this HBO treatment in accordance with Medicare guidelines. A trained emergency response team is readily available per Yes hospital policies and procedures. Continue HBOT as ordered. Yes Electronic Signature(s) Signed: 02/04/2022 1:33:41 PM By: 02/06/2022 MD FACS Previous Signature: 02/04/2022 12:06:22 PM Version By: 02/06/2022 EMT Previous Signature: 02/04/2022 9:01:25 AM Version By: 02/06/2022 EMT Entered By: [name] on 02/04/2022 13:33:40 -------------------------------------------------------------------------------- HBO Safety Checklist Details Patient Name: Date of Service: 02/06/2022 A. 02/04/2022 8:00 A M Medical Record Number: 02/06/2022 Patient Account Number: [id number] Date of Birth/Sex: Treating RN: [birth year] (66 y.o. 77 Primary Care Renwick Asman: [name] Other Clinician: [name] Referring Mandela Bello: Treating [name]/Extender: [name]: 23 HBO Safety Checklist Items Safety Checklist Consent Form Signed Patient voided / foley secured and emptied When did you last eato 0642 Last dose of injectable or oral agent 0642 Ostomy pouch emptied and vented if applicable NA All implantable devices assessed, documented and approved NA Intravenous access site secured and place NA Valuables secured Linens and cotton and cotton/polyester blend (less than 51% polyester) Personal oil-based products / skin lotions / body lotions removed Wigs or hairpieces removed NA Smoking or tobacco materials removed Books / newspapers / magazines / loose paper removed Cologne, aftershave, perfume and deodorant removed Jewelry removed (may wrap wedding band) NA Make-up removed NA Hair care products removed Battery operated devices (external) removed Heating patches and chemical warmers removed Titanium eyewear removed NA Nail polish cured greater than 10  hours NA Casting material cured greater than 10 hours NA Hearing aids removed NA Loose dentures or partials removed NA Prosthetics have been removed NA Patient demonstrates correct use of air break device (if applicable) Patient concerns have been addressed Patient grounding bracelet on and cord attached  to chamber Specifics for Inpatients (complete in addition to above) Medication sheet sent with patient NA Intravenous medications needed or due during therapy sent with patient NA Drainage tubes (e.g. nasogastric tube or chest tube secured and vented) NA Endotracheal or Tracheotomy tube secured NA Cuff deflated of air and inflated with saline NA Airway suctioned NA Notes The safety checklist was done before the treatment was started Electronic Signature(s) Signed: 02/04/2022 8:59:40 AM By: [name] EMT Entered By: [name] on 02/04/2022 08:59:39

## 2022-02-04 NOTE — Progress Notes (Signed)
CRANDALL, HARVEL ([id number]) Visit Report for 02/04/2022 Problem List Details Patient Name: Date of Service: NA [name] 02/04/2022 8:00 A M Medical Record Number: [id number] Patient Account Number: [id number] Date of Birth/Sex: Treating RN: 04-21-1956 (66 y.o. 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 02/04/2022 Medical Record Number: [id number] Patient Account Number: [id number] Date of Birth/Sex: Treating RN: 1955/10/21 (66 y.o. 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 Facility Procedures CPT4 Code: [id number] Description: G0277-(Facility Use Only) HBOT full body chamber, , ICD-10 Diagnosis Description E11.621 Type 2 diabetes mellitus with foot ulcer 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 M86.672 Other chronic  osteomyelitis, left ankle and foot 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

## 2022-02-05 DIAGNOSIS — T8131XA Disruption of external operation (surgical) wound, not elsewhere classified, initial encounter: Secondary | ICD-10-CM | POA: Diagnosis not present

## 2022-02-06 DIAGNOSIS — T8131XA Disruption of external operation (surgical) wound, not elsewhere classified, initial encounter: Secondary | ICD-10-CM | POA: Diagnosis not present

## 2022-02-07 ENCOUNTER — Encounter (HOSPITAL_BASED_OUTPATIENT_CLINIC_OR_DEPARTMENT_OTHER): Payer: BC Managed Care – PPO | Admitting: General Surgery

## 2022-02-07 DIAGNOSIS — E11621 Type 2 diabetes mellitus with foot ulcer: Secondary | ICD-10-CM | POA: Diagnosis not present

## 2022-02-07 DIAGNOSIS — E11610 Type 2 diabetes mellitus with diabetic neuropathic arthropathy: Secondary | ICD-10-CM | POA: Diagnosis not present

## 2022-02-07 DIAGNOSIS — Z951 Presence of aortocoronary bypass graft: Secondary | ICD-10-CM | POA: Diagnosis not present

## 2022-02-07 DIAGNOSIS — E1151 Type 2 diabetes mellitus with diabetic peripheral angiopathy without gangrene: Secondary | ICD-10-CM | POA: Diagnosis not present

## 2022-02-07 DIAGNOSIS — M86672 Other chronic osteomyelitis, left ankle and foot: Secondary | ICD-10-CM | POA: Diagnosis not present

## 2022-02-07 DIAGNOSIS — E1140 Type 2 diabetes mellitus with diabetic neuropathy, unspecified: Secondary | ICD-10-CM | POA: Diagnosis not present

## 2022-02-07 DIAGNOSIS — L97324 Non-pressure chronic ulcer of left ankle with necrosis of bone: Secondary | ICD-10-CM | POA: Diagnosis not present

## 2022-02-07 DIAGNOSIS — I2510 Atherosclerotic heart disease of native coronary artery without angina pectoris: Secondary | ICD-10-CM | POA: Diagnosis not present

## 2022-02-07 DIAGNOSIS — T8131XA Disruption of external operation (surgical) wound, not elsewhere classified, initial encounter: Secondary | ICD-10-CM | POA: Diagnosis not present

## 2022-02-07 LAB — GLUCOSE, CAPILLARY
Glucose-Capillary: 240 mg/dL — ABNORMAL HIGH (ref 70–99)
Glucose-Capillary: 204 mg/dL — ABNORMAL HIGH (ref 70–99)

## 2022-02-07 NOTE — Progress Notes (Signed)
ANNA, LIVERS ([id number]) Visit Report for 02/04/2022 Arrival Information Details Patient Name: Date of Service: NA [name] 02/04/2022 10:00 A M Medical Record Number: [id number] Patient Account Number: [id number] Date of Birth/Sex: Treating RN: [birth year] (66 y.o. [name] Primary Care Nishan Ovens: [name] Other Clinician: Referring Raquel Sayres: Treating Brinlyn Cena/Extender: [name] in Treatment: 23 Visit Information History Since Last Visit All ordered tests and consults were completed: Yes Patient Arrived: Knee Scooter Added or deleted any medications: No Arrival Time: 10:42 Any new allergies or adverse reactions: No Accompanied By: self Had a fall or experienced change in No Transfer Assistance: None activities of daily living that may affect Patient Requires Transmission-Based Precautions: No risk of falls: Patient Has Alerts: Yes Hospitalized since last visit: No Patient Alerts: Patient on Blood Thinner Implantable device outside of the clinic excluding No cellular tissue based products placed in the center since last visit: Pain Present Now: No Electronic Signature(s) Signed: 02/07/2022 5:12:19 PM By: Tommie Ard RN Entered By: Tommie Ard on 02/04/2022 10:43:17 -------------------------------------------------------------------------------- Encounter Discharge Information Details Patient Name: Date of Service: NA [name] 02/04/2022 10:00 A M Medical Record Number: [id number] Patient Account Number: [id number] Date of Birth/Sex: Treating RN: 1956-03-02 (66 y.o. [name] Primary Care Rudransh Bellanca: [name] Other Clinician: Referring Rhylin Venters: Treating Phat Dalton/Extender: [name] in Treatment: 23 Encounter Discharge Information Items Post Procedure Vitals Discharge Condition: Stable Temperature (F): 97.3 Ambulatory Status: Knee Scooter Pulse (bpm): 67 Discharge Destination:  Home Respiratory Rate (breaths/min): 18 Transportation: Private Auto Blood Pressure (mmHg): 162/79 Accompanied By: self Schedule Follow-up Appointment: Yes Clinical Summary of Care: Electronic Signature(s) Signed: 02/07/2022 5:12:19 PM By: Tommie Ard RN Entered By: Tommie Ard on 02/04/2022 11:27:59 -------------------------------------------------------------------------------- Lower Extremity Assessment Details Patient Name: Date of Service: [name] 02/04/2022 10:00 A M Medical Record Number: [id number] Patient Account Number: [id number] Date of Birth/Sex: Treating RN: November 26, 1955 (66 y.o. [name] Primary Care Jaivion Kingsley: [name] Other Clinician: Referring Euretha Najarro: Treating Fae Blossom/Extender: [name]: 23 Edema Assessment Assessed: [Left: No] [Right: No] Edema: [Left: Ye] [Right: s] Calf Left: Right: Point of Measurement: From Medial Instep 34 cm Ankle Left: Right: Point of Measurement: From Medial Instep 21 cm Vascular Assessment Pulses: Dorsalis Pedis Palpable: [Left:Yes] Electronic Signature(s) Signed: 02/07/2022 5:12:19 PM By: Tommie Ard RN Entered By: Tommie Ard on 02/04/2022 10:54:13 -------------------------------------------------------------------------------- Multi Wound Chart Details Patient Name: Date of Service: [name] 02/04/2022 10:00 A M Medical Record Number: [id number] Patient Account Number: [id number] Date of Birth/Sex: Treating RN: 1955-09-29 (66 y.o. [name] Primary Care Lott Seelbach: [name] Other Clinician: Referring Eulice Rutledge: Treating Citlalli Weikel/Extender: [name]: 23 Photos: [N/A:N/A] Left, Medial Foot N/A N/A Wound Location: Gradually Appeared N/A N/A Wounding Event: Diabetic Wound/Ulcer of the Lower N/A N/A Primary Etiology: Extremity Cataracts, Coronary Artery Disease, N/A N/A Comorbid  History: Hypertension, Myocardial Infarction, Peripheral Arterial Disease, Type II Diabetes, Osteomyelitis, Neuropathy 02/22/2021 N/A N/A Date Acquired: 23 N/A N/A Weeks of Treatment: Open N/A N/A Wound Status: No N/A N/A Wound Recurrence: 6.2x2.4x0.1 N/A N/A Measurements L x W x D (cm) 11.687 N/A N/A A (cm) : rea 1.169 N/A N/A Volume (cm) : 17.30% N/A N/A % Reduction in A rea: 91.70% N/A N/A % Reduction in Volume: Grade 3 N/A N/A Classification: Medium N/A N/A Exudate A mount: Serosanguineous N/A N/A Exudate Type: red, brown N/A  N/A Exudate Color: Distinct, outline attached N/A N/A Wound Margin: Large (67-100%) N/A N/A Granulation A mount: Red, Hyper-granulation N/A N/A Granulation Quality: Small (1-33%) N/A N/A Necrotic A mount: Fat Layer (Subcutaneous Tissue): Yes N/A N/A Exposed Structures: Fascia: No Tendon: No Muscle: No Joint: No Bone: No Small (1-33%) N/A N/A Epithelialization: Debridement - Selective/Open Wound N/A N/A Debridement: Pre-procedure Verification/Time Out 11:02 N/A N/A Taken: Lidocaine 4% Topical Solution N/A N/A Pain Control: Slough N/A N/A Tissue Debrided: Non-Viable Tissue N/A N/A Level: 14.88 N/A N/A Debridement A (sq cm): rea Curette N/A N/A Instrument: Minimum N/A N/A Bleeding: Pressure N/A N/A Hemostasis A chieved: 0 N/A N/A Procedural Pain: 0 N/A N/A Post Procedural Pain: Procedure was tolerated well N/A N/A Debridement Treatment Response: 6.4x2.4x0.1 N/A N/A Post Debridement Measurements L x W x D (cm) 1.206 N/A N/A Post Debridement Volume: (cm) Cellular or Tissue Based Product N/A N/A Procedures Performed: Debridement Negative Pressure Wound Therapy Maintenance (NPWT) Treatment Notes Electronic Signature(s) Signed: 02/04/2022 11:18:06 AM By: [name] MD FACS Signed: 02/07/2022 5:02:03 PM By: Adline Peals Entered By: [name] on 02/04/2022  11:18:06 -------------------------------------------------------------------------------- Multi-Disciplinary Care Plan Details Patient Name: Date of Service: [name] 02/04/2022 10:00 A M Medical Record Number: [id number] Patient Account Number: [id number] Date of Birth/Sex: Treating RN: 01/18/1956 (66 y.o. [name] Primary Care Jaidon Ellery: [name] Other Clinician: Referring Savvy Peeters: Treating Nycole Kawahara/Extender: [name] in Treatment: 23 Multidisciplinary Care Plan reviewed with physician Active Inactive HBO Nursing Diagnoses: Anxiety related to feelings of confinement associated with the hyperbaric oxygen chamber Anxiety related to knowledge deficit of hyperbaric oxygen therapy and treatment procedures Discomfort related to temperature and humidity changes inside hyperbaric chamber Potential for barotraumas to ears, sinuses, teeth, and lungs or cerebral gas embolism related to changes in atmospheric pressure inside hyperbaric oxygen chamber Potential for oxygen toxicity seizures related to delivery of 100% oxygen at an increased atmospheric pressure Potential for pulmonary oxygen toxicity related to delivery of 100% oxygen at an increased atmospheric pressure Goals: Barotrauma will be prevented during HBO2 Date Initiated: 09/17/2021 T arget Resolution Date: 02/11/2022 Goal Status: Active Patient will tolerate the hyperbaric oxygen therapy treatment Date Initiated: 09/17/2021 T arget Resolution Date: 02/11/2022 Goal Status: Active Patient will tolerate the internal climate of the chamber Date Initiated: 09/17/2021 T arget Resolution Date: 02/11/2022 Goal Status: Active Patient/caregiver will verbalize understanding of HBO goals, rationale, procedures and potential hazards Date Initiated: 09/17/2021 T arget Resolution Date: 02/11/2022 Goal Status: Active Signs and symptoms of pulmonary oxygen toxicity will be recognized and promptly  addressed Date Initiated: 09/17/2021 T arget Resolution Date: 02/11/2022 Goal Status: Active Signs and symptoms of seizure will be recognized and promptly addressed ; seizing patients will suffer no harm Date Initiated: 09/17/2021 T arget Resolution Date: 02/11/2022 Goal Status: Active Interventions: Administer decongestants, per physician orders, prior to HBO2 Administer the correct therapeutic gas delivery based on the patients needs and limitations, per physician order Assess and provide for patients comfort related to the hyperbaric environment and equalization of middle ear Assess for signs and symptoms related to adverse events, including but not limited to confinement anxiety, pneumothorax, oxygen toxicity and baurotrauma Assess patient for any history of confinement anxiety Assess patient's knowledge and expectations regarding hyperbaric medicine and provide education related to the hyperbaric environment, goals of treatment and prevention of adverse events Implement protocols to decrease risk of pneumothorax in high risk patients Notes: Nutrition Nursing Diagnoses: Impaired glucose control: actual or potential Goals: Patient/caregiver  verbalizes understanding of need to maintain therapeutic glucose control per primary care physician Date Initiated: 08/25/2021 Target Resolution Date: 02/11/2022 Goal Status: Active Interventions: Assess HgA1c results as ordered upon admission and as needed Provide education on elevated blood sugars and impact on wound healing Notes: Osteomyelitis Nursing Diagnoses: Infection: osteomyelitis Knowledge deficit related to disease process and management Goals: Patient's osteomyelitis will resolve Date Initiated: 09/17/2021 Target Resolution Date: 02/11/2022 Goal Status: Active Interventions: Assess for signs and symptoms of osteomyelitis resolution every visit Provide education on osteomyelitis Treatment Activities: Surgical debridement :  09/17/2021 Systemic antibiotics : 09/17/2021 T ordered outside of clinic : 09/17/2021 est Notes: Electronic Signature(s) Signed: 02/07/2022 5:12:19 PM By: Tommie Ard RN Entered By: Tommie Ard on 02/04/2022 10:44:09 -------------------------------------------------------------------------------- Negative Pressure Wound Therapy Maintenance (NPWT) Details Patient Name: Date of Service: [name] 02/04/2022 10:00 A M Medical Record Number: [id number] Patient Account Number: [id number] Date of Birth/Sex: Treating RN: Sep 02, 1955 (66 y.o. [name] Primary Care Ngina Royer: [name] Other Clinician: Referring Elmin Wiederholt: Treating Allisson Schindel/Extender: [name]: 23 NPWT Maintenance Performed for: Wound #1 Left, Medial Foot Performed By: Tommie Ard, RN Type: VAC System Coverage Size (sq cm): 14.88 Pressure Type: Intermittent Pressure Setting: 100 mmHG Drain Type: None Primary Contact: Non-Adherent Sponge/Dressing Type: Foam, Black Date Initiated: 03/19/2021 Dressing Removed: No Quantity of Sponges/Gauze Removed: 1 Canister Changed: No Dressing Reapplied: No Quantity of Sponges/Gauze Inserted: 1 Respones T Treatment: o tolerated well Days On NPWT : 323 Post Procedure Diagnosis Same as Pre-procedure Electronic Signature(s) Signed: 02/07/2022 5:12:19 PM By: Tommie Ard RN Entered By: Tommie Ard on 02/04/2022 11:10:06 -------------------------------------------------------------------------------- Pain Assessment Details Patient Name: Date of Service: [name] 02/04/2022 10:00 A M Medical Record Number: [id number] Patient Account Number: [id number] Date of Birth/Sex: Treating RN: 10/19/1955 (66 y.o. [name] Primary Care Valma Rotenberg: [name] Other Clinician: Referring Brantlee Hinde: Treating Jacorie Ernsberger/Extender: [name]: 23 Active Problems Location of Pain Severity and  Description of Pain Patient Has Paino No Site Locations Pain Management and Medication Current Pain Management: Electronic Signature(s) Signed: 02/07/2022 5:12:19 PM By: Tommie Ard RN Entered By: Tommie Ard on 02/04/2022 10:43:32 -------------------------------------------------------------------------------- Patient/Caregiver Education Details Patient Name: Date of Service: NA [name] 8/25/2023andnbsp10:00 A M Medical Record Number: [id number] Patient Account Number: [id number] Date of Birth/Gender: Treating RN: 1955-07-02 (66 y.o. [name] Primary Care Physician: [name] Other Clinician: Referring Physician: Treating Physician/Extender: [name] in Treatment: 23 Education Assessment Education Provided To: Patient Education Topics Provided Elevated Blood Sugar/ Impact on Healing: Methods: Explain/Verbal Responses: Reinforcements needed, State content correctly Infection: Methods: Explain/Verbal Responses: Reinforcements needed, State content correctly Electronic Signature(s) Signed: 02/07/2022 5:12:19 PM By: Tommie Ard RN Entered By: Tommie Ard on 02/04/2022 10:50:59 -------------------------------------------------------------------------------- Wound Assessment Details Patient Name: Date of Service: NA [name] 02/04/2022 10:00 A M Medical Record Number: [id number] Patient Account Number: [id number] Date of Birth/Sex: Treating RN: 1956/03/01 (66 y.o. [name] Primary Care Nobuo Nunziata: [name] Other Clinician: Referring Eithan Beagle: Treating Pearlean Sabina/Extender: [name]: 23 Wound Status Wound Number: 1 Primary Diabetic Wound/Ulcer of the Lower Extremity Etiology: Wound Location: Left, Medial Foot Wound Open Wounding Event: Gradually Appeared Status: Date Acquired: 02/22/2021 Comorbid Cataracts, Coronary Artery Disease, Hypertension, Myocardial Weeks Of  Treatment: 23 History: Infarction, Peripheral Arterial Disease, Type II Diabetes, Clustered Wound: No Osteomyelitis, Neuropathy Photos Wound Measurements Length: (cm)  6.2 Width: (cm) 2.4 Depth: (cm) 0.1 Area: (cm) 11.687 Volume: (cm) 1.169 % Reduction in Area: 17.3% % Reduction in Volume: 91.7% Epithelialization: Small (1-33%) Tunneling: No Undermining: No Wound Description Classification: Grade 3 Wound Margin: Distinct, outline attached Exudate Amount: Medium Exudate Type: Serosanguineous Exudate Color: red, brown Foul Odor After Cleansing: No Slough/Fibrino Yes Wound Bed Granulation Amount: Large (67-100%) Exposed Structure Granulation Quality: Red, Hyper-granulation Fascia Exposed: No Necrotic Amount: Small (1-33%) Fat Layer (Subcutaneous Tissue) Exposed: Yes Necrotic Quality: Adherent Slough Tendon Exposed: No Muscle Exposed: No Joint Exposed: No Bone Exposed: No Treatment Notes Wound #1 (Foot) Wound Laterality: Left, Medial Cleanser Wound Cleanser Discharge Instruction: Cleanse the wound with wound cleanser prior to applying a clean dressing using gauze sponges, not tissue or cotton balls. Peri-Wound Care Topical Primary Dressing Apligraf Discharge Instruction: DO NOT REMOVE Secondary Dressing Woven Gauze Sponge, Non-Sterile 4x4 in Discharge Instruction: In clinic. Home Health to apply wound vac. Secured With [employer], 4.5x3.1 (in/yd) Discharge Instruction: In clinic. Home Health to apply wound vac. 20M Medipore H Soft Cloth Surgical T ape, 4 x 10 (in/yd) Discharge Instruction: In clinic. Home Health to apply wound vac. Compression Wrap Compression Stockings Add-Ons Electronic Signature(s) Signed: 02/07/2022 5:12:19 PM By: Blanche East RN Entered By: Blanche East on 02/04/2022 10:56:52 -------------------------------------------------------------------------------- Vitals Details Patient Name: Date of Service: NA [name]  02/04/2022 10:00 A M Medical Record Number: [id number] Patient Account Number: [id number] Date of Birth/Sex: Treating RN: 03-14-1956 (66 y.o. [name] Primary Care Anahid Eskelson: [name] Other Clinician: Referring Al Gagen: Treating Sean Malinowski/Extender: [name] Weeks in Treatment: 23 Vital Signs Time Taken: 10:40 Temperature (F): 97.3 Height (in): 74 Pulse (bpm): 67 Weight (lbs): 186 Respiratory Rate (breaths/min): 18 Body Mass Index (BMI): 23.9 Blood Pressure (mmHg): 162/79 Capillary Blood Glucose (mg/dl): 177 Reference Range: 80 - 120 mg / dl Electronic Signature(s) Signed: 02/07/2022 5:12:19 PM By: Blanche East RN Entered By: Blanche East on 02/04/2022 11:27:16

## 2022-02-07 NOTE — Progress Notes (Addendum)
[name], [name] ([id number]) Visit Report for 02/07/2022 Arrival Information Details Patient Name: Date of Service: NA [name] 02/07/2022 8:00 A M Medical Record Number: [id number] Patient Account Number: [id number] Date of Birth/Sex: Treating RN: April 24, 1956 (66 y.o. [name] Primary Care Shandrell Boda: [name] Other Clinician: [name] Referring Tenesia Escudero: Treating Bishoy Cupp/Extender: [name] in Treatment: 23 Visit Information History Since Last Visit All ordered tests and consults were completed: Yes Patient Arrived: Knee Scooter Added or deleted any medications: No Arrival Time: 08:26 Any new allergies or adverse reactions: No Accompanied By: Wife Had a fall or experienced change in No Transfer Assistance: None activities of daily living that may affect Patient Identification Verified: Yes risk of falls: Secondary Verification Process Completed: Yes Signs or symptoms of abuse/neglect since last visito No Patient Requires Transmission-Based Precautions: No Hospitalized since last visit: No Patient Has Alerts: Yes Implantable device outside of the clinic excluding No Patient Alerts: Patient on Blood Thinner cellular tissue based products placed in the center since last visit: Pain Present Now: No Electronic Signature(s) Signed: 02/07/2022 12:17:27 PM By: [name] EMT Entered By: [name] on 02/07/2022 12:17:27 -------------------------------------------------------------------------------- Encounter Discharge Information Details Patient Name: Date of Service: NA Colin [name] 02/07/2022 8:00 A M Medical Record Number: [id number] Patient Account Number: [id number] Date of Birth/Sex: Treating RN: February 22, 1956 (66 y.o. [name] Primary Care Jomel Whittlesey: [name] Other Clinician: [name] Referring Harim Bi: Treating Aydon Swamy/Extender: [name]: 23 Encounter  Discharge Information Items Discharge Condition: Stable Ambulatory Status: Knee Scooter Discharge Destination: Home Transportation: Private Auto Accompanied By: Wife Schedule Follow-up Appointment: Yes Clinical Summary of Care: Electronic Signature(s) Signed: 02/07/2022 12:28:39 PM By: [name] EMT Previous Signature: 02/07/2022 12:26:51 PM Version By: [name] EMT Entered By: [name] on 02/07/2022 12:28:39 -------------------------------------------------------------------------------- Vitals Details Patient Name: Date of Service: NA [name], [name] 02/07/2022 8:00 A M Medical Record Number: [id number] Patient Account Number: [id number] Date of Birth/Sex: Treating RN: December 01, 1955 (66 y.o. [name] Primary Care Alexx Giambra: [name] Other Clinician: [name] Referring Daquisha Clermont: Treating Terena Bohan/Extender: [name]: 23 Vital Signs Time Taken: 08:44 Temperature (F): 97.5 Height (in): 74 Pulse (bpm): 78 Weight (lbs): 186 Respiratory Rate (breaths/min): 16 Body Mass Index (BMI): 23.9 Blood Pressure (mmHg): 140/77 Capillary Blood Glucose (mg/dl): 235 Reference Range: 80 - 120 mg / dl Electronic Signature(s) Signed: 02/07/2022 12:18:48 PM By: [name] EMT Entered By: [name] on 02/07/2022 12:18:48

## 2022-02-07 NOTE — Progress Notes (Signed)
CALVYN, KURTZMAN ([id number]) Visit Report for 02/04/2022 Chief Complaint Document Details Patient Name: Date of Service: NA [name] 02/04/2022 10:00 A M Medical Record Number: [id number] Patient Account Number: [id number] Date of Birth/Sex: Treating RN: [birth year] (66 y.o. [name] Primary Care Provider: [name] Other Clinician: Referring Provider: Treating Provider/Extender: [name] in Treatment: 23 Information Obtained from: Patient Chief Complaint Patients presents for treatment of an open diabetic ulcer with exposed bone and osteomyelitis Electronic Signature(s) Signed: 02/04/2022 11:18:15 AM By: [name] MD FACS Entered By: [name] on 02/04/2022 11:18:14 -------------------------------------------------------------------------------- Cellular or Tissue Based Product Details Patient Name: Date of Service: NA [name] 02/04/2022 10:00 A M Medical Record Number: [id number] Patient Account Number: [id number] Date of Birth/Sex: Treating RN: 09-04-1955 (66 y.o. [name] Primary Care Provider: [name] Other Clinician: Referring Provider: Treating Provider/Extender: [name] in Treatment: 23 Cellular or Tissue Based Product Type Wound #1 Left,Medial Foot Applied to: Performed By: Physician [name], MD Cellular or Tissue Based Product Type: Apligraf Level of Consciousness (Pre-procedure): Awake and Alert Pre-procedure Verification/Time Out Yes - 11:04 Taken: Location: genitalia / hands / feet / multiple digits Wound Size (sq cm): 14.88 Product Size (sq cm): 44 Waste Size (sq cm): 17.6 Waste Reason: size of wound Amount of Product Applied (sq cm): 26.4 Instrument Used: Curette, Forceps, Scissors Lot #: [id number] Order #: 2 Expiration Date: 02/15/2022 Fenestrated: Yes Instrument: Blade Reconstituted: Yes Solution Type: normal saline Solution Amount:  6ml Lot #: [id number] Solution Expiration Date: 03/13/2022 Secured: Yes Secured With: Steri-Strips Dressing Applied: Yes Primary Dressing: adaptec Procedural Pain: 0 Post Procedural Pain: 0 Response to Treatment: Procedure was tolerated well Level of Consciousness (Post- Awake and Alert procedure): Post Procedure Diagnosis Same as Pre-procedure Electronic Signature(s) Signed: 02/04/2022 1:33:14 PM By: [name] MD FACS Signed: 02/07/2022 5:12:19 PM By: Tommie Ard RN Entered By: Tommie Ard on 02/04/2022 11:08:52 -------------------------------------------------------------------------------- Debridement Details Patient Name: Date of Service: NA Colin Nephew A. 02/04/2022 10:00 A M Medical Record Number: [id number] Patient Account Number: [id number] Date of Birth/Sex: Treating RN: 1955-07-28 (66 y.o. [name] Primary Care Provider: [name] Other Clinician: Referring Provider: Treating Provider/Extender: [name] in Treatment: 23 Debridement Performed for Assessment: Wound #1 Left,Medial Foot Performed By: Physician [name], MD Debridement Type: Debridement Severity of Tissue Pre Debridement: Fat layer exposed Level of Consciousness (Pre-procedure): Awake and Alert Pre-procedure Verification/Time Out Yes - 11:02 Taken: Start Time: 11:03 Pain Control: Lidocaine 4% T opical Solution T Area Debrided (L x W): otal 6.2 (cm) x 2.4 (cm) = 14.88 (cm) Tissue and other material debrided: Non-Viable, Slough, Slough Level: Non-Viable Tissue Debridement Description: Selective/Open Wound Instrument: Curette Bleeding: Minimum Hemostasis Achieved: Pressure Procedural Pain: 0 Post Procedural Pain: 0 Response to Treatment: Procedure was tolerated well Level of Consciousness (Post- Awake and Alert procedure): Post Debridement Measurements of Total Wound Length: (cm) 6.4 Width: (cm) 2.4 Depth: (cm) 0.1 Volume: (cm)  1.206 Character of Wound/Ulcer Post Debridement: Improved Severity of Tissue Post Debridement: Fat layer exposed Post Procedure Diagnosis Same as Pre-procedure Electronic Signature(s) Signed: 02/04/2022 1:33:14 PM By: [name] MD FACS Signed: 02/07/2022 5:12:19 PM By: Tommie Ard RN Entered By: Tommie Ard on 02/04/2022 11:04:25 -------------------------------------------------------------------------------- HPI Details Patient Name: Date of Service: NA RRO Colin Aris [name] A. 02/04/2022 10:00 A M Medical Record Number: [id number] Patient Account Number: [id number] Date of Birth/Sex: Treating RN: 1955-07-05 [phone]  y.o. [name] Primary Care Provider: [name] Other Clinician: Referring Provider: Treating Provider/Extender: [name] in Treatment: 23 History of Present Illness HPI Description: ADMISSION 08/25/2021 This is a 66 year old man who initially presented to his primary care provider in September 2022 with pain in his left foot. He was sent for an x-ray and while the x-ray was being performed, the tech pointed out a wound on his foot that the patient was not aware existed. He does have type 2 diabetes with significant neuropathy. His diabetes is suboptimally controlled with his most recent A1c being 8.5. He also has a history of coronary artery disease status post three- vessel CABG. he was initially seen by orthopedics, but they referred him to [facility]. He has undergone at least 7 operations/debridements and several applications of skin substitute under the care of podiatry. He has been in a wound VAC for much of this time. His most recent procedure was July 28, 2021. A portion of the talus was biopsied and was found to be consistent with osteomyelitis. Culture also returned positive for corynebacterium. He was seen on August 16, 2021 by infectious disease. A PICC line has been placed and he will be receiving a 6-week  course of IV daptomycin and cefepime. In October 2022, he underwent lower extremity vascular studies. Results are copied here: Right: Resting right ankle-brachial index is within normal range. No evidence of significant right lower extremity arterial disease. The right toe-brachial index is abnormal. Left: Resting left ankle-brachial index indicates mild left lower extremity arterial disease. The left toe-brachial index is abnormal. He has not been seen by vascular surgery despite these findings. He presented to clinic today in a cam boot and is using a knee scooter to offload. Wound VAC was in place. Once this was removed, a large ulcer was identified on the left midfoot/ankle. Bone is frankly exposed. There is no malodorous or purulent drainage. There is some granulation tissue over the central portion of the exposed bone. There is a tunnel that extends posteriorly for roughly 10 cm. It has been discussed with him by multiple providers that he is at very high risk of losing his lower leg because of this wound. He is extremely eager to avoid this outcome and is here today to review his options as well as receive ongoing wound care. 09/03/2021: Here for reevaluation of his wound. There does not appear to have been any substantial improvement overall since our last visit. He has been in a wound VAC with white foam overlying the exposed bone. We are working on getting him approved for hyperbaric oxygen therapy. 09/10/2021: We are in the process of getting him cleared to begin hyperbaric oxygen therapy. He still needs to obtain a chest x-ray. Although the wound measurements are roughly the same, I think the overall appearance of the wound is better. The exposed bone has a bit more granulation tissue covering it. He has not received a vascular surgery appointment to reevaluate his flow to the wound. 09/17/2021: He has been approved for hyperbaric oxygen therapy and completed his chest x-ray, which I  reviewed and it appears normal. The tunnels at the 12 and 10:00 positions are smaller. There is more granulation tissue covering the exposed bone and the undermining has decreased. He still has not received a vascular surgery appointment. 09/24/2021: He initiated hyperbaric oxygen therapy this week and is tolerating it well. He has an appointment with vascular surgery coming up on May 16.  The granulation tissue is covering more of the exposed bone and both tunnels are a bit smaller. 10/01/2021: He continues to tolerate hyperbaric oxygen therapy. He saw infectious disease and they are planning to pull his PICC line. He has been initiated on oral antibiotics (doxycycline and Augmentin). The wound looks about the same but the tunnels are a little bit smaller. The skin seems to be contracting somewhat around the exposed bone. 10/08/2021: The wound is still about the same size, but the tunnels continue to come in and the skin is contracting around the exposed bone. He continues to have some accumulation of necrotic material in the inferoposterior aspect of the wound as well as accumulation at the 12:00 tunnel area. 10/15/2021: The wound is smaller today. The tunnels continue to come in. There is less necrotic tissue present. He does have some periwound maceration. 10/22/2021: The wound is about the same size. There is a little bit less undermining at the distal portion. The exposed bone is dark and I am not sure if this is staining from silver nitrate or his VAC sponge or if it represents necrosis. The tunnels are shallower but he does have some serous drainage coming from the 10:00 tunnel. He continues to tolerate hyperbaric oxygen therapy well. 10/29/2021: The undermining continues to improve. The tunnels are about the same. He has good granulation tissue overlying the majority of the exposed bone. It does appear that perhaps the tubing from his wound VAC has been eroding the skin at the 12 clock position. He  continues to accumulate senescent epithelium around the borders of the wound. 11/05/2021: The undermining is almost completely resolved. The tunnels have contracted fairly significantly. No significant slough or debris accumulation. There is still senescent epithelium accumulation around the borders of the wound. He has been tolerating hyperbaric oxygen therapy well. 11/12/2021: Despite the measurements of the wound being about the same, the wound has changed in its shape and overall, I think it is improved. The undermining has resolved and the tunnels continue to shorten. There is good granulation tissue encroaching over the small area of bone that has remained exposed at the 12 o'clock position. Minimal slough accumulation. He continues to tolerate hyperbaric oxygen therapy well. 11/19/2021: I took a PCR culture last week. There was overgrowth of yeast. He is already taking suppressive doxycycline and Augmentin. I added fluconazole to his regimen. The wound is smaller again today. The tunnels continue to shorten. He continues to do well with hyperbaric oxygen therapy. 11/26/2021: For some reason, his foot has become macerated. The wound is narrower but about the same dimensions in its longitudinal aspect. The tunnels continue to shorten. He has some slough buildup on the wound as well as some heaped up senescent epithelium around the perimeter. 12/03/2021: No further maceration of his foot has occurred. The wound has contracted quite significantly from last week. The tunnel at 10:00 is closed. The tunnel at 12:00 is down to just a couple of millimeters. No other undermining is present. There is soft tissue coverage of the previously exposed bone. There is just a bit of slough and biofilm on the wound surface. 12/10/2021: The wound is looking good. It turns out the tunnel at 12:00 is only exposed when the patient dorsiflexes his foot. It is about 2 cm in depth when he does this; when his foot is in  plantarflexion, the tunnel is closed. The bone that was visible at the 12:00 tunnel is completely covered with granulation tissue, but there does feel  like some exposed bone deeper into the tunnel area. There is senescent skin heaped up around the periphery. Minimal slough on the wound surface. 12/16/2021: The wound dimensions are roughly the same. The surface has nice granulation tissue. The exposed bone at the 12:00 tunnel continues to be covered with more soft tissue. 7/14; patient's wound measures smaller today. Using the wound VAC with underlying collagen. He is also being treated with HBO for underlying osteomyelitis. He tells me he is on doxycycline and ampicillin follows with infectious disease next week 12/31/2021: The wound continues to contract. Unfortunately, the area where the track pad and tubing have been rubbing continues to look like it is applying friction. He says that the home health nurses that have been applying the [facility] have been putting gauze underneath the tubing, but nonetheless there is ongoing tissue breakdown at this site. Light slough accumulation on the wound surface. The tunnel continues to contract. He is tolerating HBO without difficulty. 01/07/2022: Bridging the wound VAC away from the ankle has resulted in significant improvement in the tissue at the apex of the wound. The tunnel is still present and is not all that much shorter, but the overall wound surface is very robust and healthy looking. Minimal slough accumulation. No concern for acute infection. 01/21/2022: The wound continues to contract and has a robust granulation tissue surface. The tunnel has come in considerably and is down to about 1.4 cm. There is still bone exposed within the tunnel but the rest of it is well covered. There is some senescent epithelium at the wound margins and a little bit of slough on the surface. 01/28/2022: No significant change in the wound this week, but there has not been any  reaccumulation of senescent epithelium or slough. The tunnel is perhaps a millimeter less in depth. He has been approved for Apligraf and we will apply this today. 02/04/2022: The wound has contracted somewhat and the tunnel has filled in completely. The wound surface is clean. He is here for Apligraf #2. Electronic Signature(s) Signed: 02/04/2022 11:18:48 AM By: [name] MD FACS Entered By: [name] on 02/04/2022 11:18:48 -------------------------------------------------------------------------------- Physical Exam Details Patient Name: Date of Service: NA Colin Janus [name] A. 02/04/2022 10:00 A M Medical Record Number: [id number] Patient Account Number: [id number] Date of Birth/Sex: Treating RN: 15-Aug-1955 (66 y.o. [name] Primary Care Provider: [name] Other Clinician: Referring Provider: Treating Provider/Extender: [name]: 23 Constitutional No acute distress.Marland Kitchen Respiratory Normal work of breathing on room air.. Notes 02/04/2022: The wound has contracted somewhat and the tunnel has filled in completely. The wound surface is clean. Electronic Signature(s) Signed: 02/04/2022 11:19:19 AM By: [name] MD FACS Entered By: [name] on 02/04/2022 11:19:19 -------------------------------------------------------------------------------- Physician Orders Details Patient Name: Date of Service: NA Colin Nephew A. 02/04/2022 10:00 A M Medical Record Number: [id number] Patient Account Number: [id number] Date of Birth/Sex: Treating RN: [birth year] (66 y.o. 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 - Dr. Lady Gary - Room 2 - Return  A Friday 9/1 at 10:45 Bathing/ Shower/ Hygiene May shower with protection but do not get wound dressing(s) wet. Negative Presssure Wound Therapy Black Foam Other: - WOUND VAC CONTINUOUSLY AT 100MM/HG PRESSURE Edema Control - Lymphedema / SCD / Other Elevate legs to the level of the heart or above for 30 minutes daily and/or when sitting, a frequency of: Home Health Discontinue home health for wound care. Other Home Health Orders/Instructions: [name] Hyperbaric Oxygen Therapy Evaluate for HBO Therapy Indication: - Wagner 3 diabetic ulcer left foot If appropriate for treatment, begin HBOT per protocol: 2.5 ATA for 90 Minutes with 2 Five (5) Minute A Breaks ir Total Number of Treatments: - 12/31/2021 additional 40 treatments. T of 120 otal One treatments per day (delivered Monday through Friday unless otherwise specified in Special Instructions below): Finger stick Blood Glucose Pre- and Post- HBOT Treatment. Follow Hyperbaric Oxygen Glycemia Protocol A frin (Oxymetazoline HCL) 0.05% nasal spray - 1 spray in both nostrils daily as needed prior to HBO treatment for difficulty clearing ears Wound Treatment Wound #1 - Foot Wound Laterality: Left, Medial Cleanser: Wound Cleanser 1 x Per Week/30 Days Discharge Instructions: Cleanse the wound with wound cleanser prior to applying a clean dressing using gauze sponges, not tissue or cotton balls. Prim Dressing: Apligraf 1 x Per Week/30 Days ary Discharge Instructions: DO NOT REMOVE Secondary Dressing: Woven Gauze Sponge, Non-Sterile 4x4 in 1 x Per Week/30 Days Discharge Instructions: In clinic. Home Health to apply wound vac. Secured With:  [employer], 4.5x3.1 (in/yd) 1 x Per Week/30 Days Discharge Instructions: In clinic. Home Health to apply wound vac. Secured With: 58M Medipore H Soft Cloth Surgical T ape, 4 x 10 (in/yd) 1 x Per Week/30 Days Discharge Instructions: In clinic. Home Health to apply wound vac. GLYCEMIA INTERVENTIONS PROTOCOL PRE-HBO GLYCEMIA INTERVENTIONS ACTION INTERVENTION Obtain pre-HBO capillary blood glucose (ensure 1 physician order is in chart). A. Notify HBO physician and await physician orders. 2 If result is 70 mg/dl or below: B. If the result meets the hospital definition of a critical result, follow hospital policy. A. Give patient an 8 ounce Glucerna Shake, an 8 ounce Ensure, or 8 ounces of a Glucerna/Ensure equivalent dietary supplement*. B. Wait 30 minutes. If result is 71 mg/dl to 161 mg/dl: C. Retest patients capillary blood glucose (CBG). D. If result greater than or equal to 110 mg/dl, proceed with HBO. If result less than 110 mg/dl, notify HBO physician and consider holding HBO. If result is 131 mg/dl to 096 mg/dl: A. Proceed with HBO. A. Notify HBO physician and await physician orders. B. It is recommended to hold HBO and do If result is 250 mg/dl or greater: blood/urine ketone testing. C. If the result meets the hospital definition of a critical result, follow hospital policy. POST-HBO GLYCEMIA INTERVENTIONS ACTION INTERVENTION Obtain post HBO capillary blood glucose (ensure 1 physician order is in chart). A. Notify HBO physician and await physician orders. 2 If result is 70 mg/dl or below: B. If the result meets the hospital definition of a critical result, follow hospital policy. A. Give patient an 8 ounce Glucerna Shake, an 8 ounce Ensure, or 8 ounces of a Glucerna/Ensure equivalent dietary supplement*. B. Wait 15 minutes for symptoms of If result is 71 mg/dl to 045 mg/dl: hypoglycemia (i.e. nervousness, anxiety, sweating, chills, clamminess,  irritability, confusion, tachycardia or dizziness). C. If patient asymptomatic, discharge patient. If patient symptomatic, repeat capillary blood glucose (CBG) and notify HBO physician. If result is 101 mg/dl to  249 mg/dl: A. Discharge patient. A. Notify HBO physician and await physician orders. B. It is recommended to do blood/urine ketone If result is 250 mg/dl or greater: testing. C. If the result meets the hospital definition of a critical result, follow hospital policy. *Juice or candies are NOT equivalent products. If patient refuses the Glucerna or Ensure, please consult the hospital dietitian for an appropriate substitute. Electronic Signature(s) Signed: 02/07/2022 1:46:15 PM By: Duanne Guessannon, Opel Lejeune MD FACS Signed: 02/07/2022 5:02:03 PM By: Samuella BruinHerrington, Taylor Previous Signature: 02/04/2022 1:33:14 PM Version By: Duanne Guessannon, Nyala Kirchner MD FACS Entered By: Samuella BruinHerrington, Taylor on 02/07/2022 11:17:35 -------------------------------------------------------------------------------- Problem List Details Patient Name: Date of Service: NA [name], [name] A. 02/04/2022 10:00 A M Medical Record Number: [id number] Patient Account Number: [id number] Date of Birth/Sex: Treating RN: 1955-08-12 (66 y.o. Colin PalauM) Herrington, Taylor Primary Care Provider: Simone CuriaLee, Keung Other Clinician: Referring Provider: Treating Provider/Extender: [name], Darik Massing Lee, Keung Weeks in Treatment: 23 Active Problems ICD-10 Encounter Code Description Active Date MDM Diagnosis [id number] Other chronic osteomyelitis, left ankle and foot 08/25/2021 No Yes E11.610 Type 2 diabetes mellitus with diabetic neuropathic arthropathy 08/25/2021 No Yes E11.621 Type 2 diabetes mellitus with foot ulcer 08/25/2021 No Yes I25.10 Atherosclerotic heart disease of native coronary artery without angina pectoris 08/25/2021 No Yes L97.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 02/04/2022 10:00 A M Medical Record Number: [id number] Patient Account Number: [id number] Date of Birth/Sex: Treating RN: 1955-08-12 (66 y.o. Colin PalauM) Herrington, Taylor Primary Care Provider: Simone CuriaLee, Keung Other Clinician: Referring Provider: Treating Provider/Extender: Derry Skillannon, Elice Crigger Lee, Keung Weeks in Treatment: 23 Subjective Chief Complaint Information obtained from Patient Patients presents for treatment of an open diabetic ulcer with exposed bone and osteomyelitis History of Present Illness (HPI) ADMISSION 08/25/2021 This is a 66 year old man who initially presented to his primary care provider in September 2022 with pain in his left foot. He was sent for an x-ray and while the x-ray was being performed, the tech pointed out a wound on his foot that the patient was not aware existed. He does have type 2 diabetes with significant neuropathy. His diabetes is suboptimally controlled with his most recent A1c being 8.5. He also has a history of coronary artery disease status post three- vessel CABG. he was initially seen by orthopedics, but they referred him to [facility]. He has undergone at least 7 operations/debridements and several applications of skin substitute under the care of podiatry. He has been in a wound VAC for much of this time. His most recent procedure was July 28, 2021. A portion of the talus was biopsied and was found to be consistent with osteomyelitis. Culture also returned positive for corynebacterium. He was seen on August 16, 2021 by  infectious disease. A PICC line has been placed and he will be receiving a 6-week course of IV daptomycin and cefepime. In October 2022, he underwent lower extremity vascular studies. Results are copied here: Right: Resting right ankle-brachial index is within normal range. No evidence of significant right lower extremity arterial disease. The right toe-brachial index is abnormal. Left: Resting left ankle-brachial index indicates mild left lower extremity arterial disease. The  left toe-brachial index is abnormal. He has not been seen by vascular surgery despite these findings. He presented to clinic today in a cam boot and is using a knee scooter to offload. Wound VAC was in place. Once this was removed, a large ulcer was identified on the left midfoot/ankle. Bone is frankly exposed. There is no malodorous or purulent drainage. There is some granulation tissue over the central portion of the exposed bone. There is a tunnel that extends posteriorly for roughly 10 cm. It has been discussed with him by multiple providers that he is at very high risk of losing his lower leg because of this wound. He is extremely eager to avoid this outcome and is here today to review his options as well as receive ongoing wound care. 09/03/2021: Here for reevaluation of his wound. There does not appear to have been any substantial improvement overall since our last visit. He has been in a wound VAC with white foam overlying the exposed bone. We are working on getting him approved for hyperbaric oxygen therapy. 09/10/2021: We are in the process of getting him cleared to begin hyperbaric oxygen therapy. He still needs to obtain a chest x-ray. Although the wound measurements are roughly the same, I think the overall appearance of the wound is better. The exposed bone has a bit more granulation tissue covering it. He has not received a vascular surgery appointment to reevaluate his flow to the wound. 09/17/2021: He has been  approved for hyperbaric oxygen therapy and completed his chest x-ray, which I reviewed and it appears normal. The tunnels at the 12 and 10:00 positions are smaller. There is more granulation tissue covering the exposed bone and the undermining has decreased. He still has not received a vascular surgery appointment. 09/24/2021: He initiated hyperbaric oxygen therapy this week and is tolerating it well. He has an appointment with vascular surgery coming up on May 16. The granulation tissue is covering more of the exposed bone and both tunnels are a bit smaller. 10/01/2021: He continues to tolerate hyperbaric oxygen therapy. He saw infectious disease and they are planning to pull his PICC line. He has been initiated on oral antibiotics (doxycycline and Augmentin). The wound looks about the same but the tunnels are a little bit smaller. The skin seems to be contracting somewhat around the exposed bone. 10/08/2021: The wound is still about the same size, but the tunnels continue to come in and the skin is contracting around the exposed bone. He continues to have some accumulation of necrotic material in the inferoposterior aspect of the wound as well as accumulation at the 12:00 tunnel area. 10/15/2021: The wound is smaller today. The tunnels continue to come in. There is less necrotic tissue present. He does have some periwound maceration. 10/22/2021: The wound is about the same size. There is a little bit less undermining at the distal portion. The exposed bone is dark and I am not sure if this is staining from silver nitrate or his VAC sponge or if it represents necrosis. The tunnels are shallower but he does have some serous drainage coming from the 10:00 tunnel. He continues to tolerate hyperbaric oxygen therapy well. 10/29/2021: The undermining continues to improve. The tunnels are about the same. He has good granulation tissue overlying the majority of the exposed bone. It does appear that perhaps the  tubing from his wound VAC has been eroding the skin at the 12 clock position. He continues to accumulate senescent epithelium around the borders of  the wound. 11/05/2021: The undermining is almost completely resolved. The tunnels have contracted fairly significantly. No significant slough or debris accumulation. There is still senescent epithelium accumulation around the borders of the wound. He has been tolerating hyperbaric oxygen therapy well. 11/12/2021: Despite the measurements of the wound being about the same, the wound has changed in its shape and overall, I think it is improved. The undermining has resolved and the tunnels continue to shorten. There is good granulation tissue encroaching over the small area of bone that has remained exposed at the 12 o'clock position. Minimal slough accumulation. He continues to tolerate hyperbaric oxygen therapy well. 11/19/2021: I took a PCR culture last week. There was overgrowth of yeast. He is already taking suppressive doxycycline and Augmentin. I added fluconazole to his regimen. The wound is smaller again today. The tunnels continue to shorten. He continues to do well with hyperbaric oxygen therapy. 11/26/2021: For some reason, his foot has become macerated. The wound is narrower but about the same dimensions in its longitudinal aspect. The tunnels continue to shorten. He has some slough buildup on the wound as well as some heaped up senescent epithelium around the perimeter. 12/03/2021: No further maceration of his foot has occurred. The wound has contracted quite significantly from last week. The tunnel at 10:00 is closed. The tunnel at 12:00 is down to just a couple of millimeters. No other undermining is present. There is soft tissue coverage of the previously exposed bone. There is just a bit of slough and biofilm on the wound surface. 12/10/2021: The wound is looking good. It turns out the tunnel at 12:00 is only exposed when the patient dorsiflexes his  foot. It is about 2 cm in depth when he does this; when his foot is in plantarflexion, the tunnel is closed. The bone that was visible at the 12:00 tunnel is completely covered with granulation tissue, but there does feel like some exposed bone deeper into the tunnel area. There is senescent skin heaped up around the periphery. Minimal slough on the wound surface. 12/16/2021: The wound dimensions are roughly the same. The surface has nice granulation tissue. The exposed bone at the 12:00 tunnel continues to be covered with more soft tissue. 7/14; patient's wound measures smaller today. Using the wound VAC with underlying collagen. He is also being treated with HBO for underlying osteomyelitis. He tells me he is on doxycycline and ampicillin follows with infectious disease next week 12/31/2021: The wound continues to contract. Unfortunately, the area where the track pad and tubing have been rubbing continues to look like it is applying friction. He says that the home health nurses that have been applying the [facility] have been putting gauze underneath the tubing, but nonetheless there is ongoing tissue breakdown at this site. Light slough accumulation on the wound surface. The tunnel continues to contract. He is tolerating HBO without difficulty. 01/07/2022: Bridging the wound VAC away from the ankle has resulted in significant improvement in the tissue at the apex of the wound. The tunnel is still present and is not all that much shorter, but the overall wound surface is very robust and healthy looking. Minimal slough accumulation. No concern for acute infection. 01/21/2022: The wound continues to contract and has a robust granulation tissue surface. The tunnel has come in considerably and is down to about 1.4 cm. There is still bone exposed within the tunnel but the rest of it is well covered. There is some senescent epithelium at the wound margins and a  little bit of slough on the surface. 01/28/2022: No  significant change in the wound this week, but there has not been any reaccumulation of senescent epithelium or slough. The tunnel is perhaps a millimeter less in depth. He has been approved for Apligraf and we will apply this today. 02/04/2022: The wound has contracted somewhat and the tunnel has filled in completely. The wound surface is clean. He is here for Apligraf #2. Patient History Information obtained from Patient. Family History Cancer - Father, Diabetes - Father,Mother,Paternal Grandparents, Heart Disease - Father, Hypertension - Father, No family history of Hereditary Spherocytosis, Kidney Disease, Lung Disease, Seizures, Stroke, Thyroid Problems, Tuberculosis. Social History Never smoker, Marital Status - Married, Alcohol Use - Rarely, Drug Use - No History, Caffeine Use - Daily. Medical History Eyes Patient has history of Cataracts - Removed 2008 Cardiovascular Patient has history of Coronary Artery Disease, Hypertension, Myocardial Infarction, Peripheral Arterial Disease Endocrine Patient has history of Type II Diabetes Musculoskeletal Patient has history of Osteomyelitis Neurologic Patient has history of Neuropathy Medical A Surgical History Notes nd Cardiovascular Hypercholesterolemia Abnormal EKG CABG X3 2019 Gastrointestinal GERD Musculoskeletal Diabetic foot ulcer Objective Constitutional No acute distress.Marland Kitchen Respiratory Normal work of breathing on room air.. General Notes: 02/04/2022: The wound has contracted somewhat and the tunnel has filled in completely. The wound surface is clean. Integumentary (Hair, Skin) Wound #1 status is Open. Original cause of wound was Gradually Appeared. The date acquired was: 02/22/2021. The wound has been in treatment 23 weeks. The wound is located on the Left,Medial Foot. The wound measures 6.2cm length x 2.4cm width x 0.1cm depth; 11.687cm^2 area and 1.169cm^3 volume. There is Fat Layer (Subcutaneous Tissue) exposed. There is no  tunneling or undermining noted. There is a medium amount of serosanguineous drainage noted. The wound margin is distinct with the outline attached to the wound base. There is large (67-100%) red, hyper - granulation within the wound bed. There is a small (1- 33%) amount of necrotic tissue within the wound bed including Adherent Slough. 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 There was a Selective/Open Wound Non-Viable Tissue Debridement with a total area of 14.88 sq cm performed by [name], MD. With the following instrument(s): Curette to remove Non-Viable tissue/material. Material removed includes [facility] after achieving pain control using Lidocaine 4% T opical Solution. No specimens were taken. A time out was conducted at 11:02, prior to the start of the procedure. A Minimum amount of bleeding was controlled with Pressure. The procedure was tolerated well with a pain level of 0 throughout and a pain level of 0 following the procedure. Post Debridement Measurements: 6.4cm length x 2.4cm width x 0.1cm depth; 1.206cm^3 volume. Character of Wound/Ulcer Post Debridement is improved. Severity of Tissue Post Debridement is: Fat layer exposed. Post procedure Diagnosis Wound #1: Same as Pre-Procedure Pre-procedure diagnosis of Wound #1 is a Diabetic Wound/Ulcer of the Lower Extremity located on the Left,Medial Foot. A skin graft procedure using a bioengineered skin substitute/cellular or tissue based product was  performed by [name], MD with the following instrument(s): Curette, Forceps, and Scissors. Apligraf was  applied and secured with Steri-Strips. 26.4 sq cm of product was utilized and 17.6 sq cm was wasted due to size of wound. Post Application, adaptec was applied. A Time Out was conducted at 11:04, prior to the start of the procedure. The procedure was tolerated well with a pain level of 0 throughout and a pain level of 0 following the procedure. Post procedure Diagnosis Wound #1: Same as Pre-Procedure . Plan Follow-up Appointments: Return Appointment in 1 week. - Dr. Lady Gary - Room 2 - Friday 9/1 at 10:45 Bathing/ Shower/ Hygiene: May shower with protection but do not get wound dressing(s) wet. Negative Presssure Wound Therapy: Black Foam Other: - WOUND VAC CONTINUOUSLY AT 100MM/HG PRESSURE Edema Control - Lymphedema / SCD / Other: Elevate legs to the level of the heart or above for 30 minutes daily and/or when sitting, a frequency of: Home Health: New wound care orders this week; continue Home Health for wound care. May utilize formulary equivalent dressing for wound treatment orders unless otherwise specified. - WOUND VAC CONTINUOUSLY AT 100MM/HG PRESSURE - DO NOT REMOVE SKIN SUB DRESSING - ONLY PUT THE WOUND VAC ON 01/28/2022 WILL NOT NEED TO CHANGE UNTIL 02/04/2022 Other Home Health Orders/Instructions: [name] Hyperbaric Oxygen Therapy: Evaluate for HBO Therapy Indication: - Wagner 3 diabetic ulcer left foot If appropriate for treatment, begin HBOT per protocol: 2.5 ATA for 90 Minutes with 2 Five (5) Minute Air Breaks T Number of Treatments: - 12/31/2021 additional 40 treatments. T of 120 otal otal One treatments per day (delivered Monday through Friday unless otherwise specified in Special Instructions below): Finger stick Blood Glucose Pre- and Post- HBOT Treatment. Follow Hyperbaric Oxygen Glycemia Protocol Afrin (Oxymetazoline HCL) 0.05% nasal spray - 1 spray in both  nostrils daily as needed prior to HBO treatment for difficulty clearing ears WOUND #1: - Foot Wound Laterality: Left, Medial Cleanser: Wound Cleanser 1 x Per Week/30 Days Discharge Instructions: Cleanse the wound with wound cleanser prior to applying a clean dressing using gauze sponges, not tissue or cotton balls. Prim Dressing: Apligraf 1 x Per Week/30 Days ary Discharge Instructions: DO NOT REMOVE Secondary Dressing: Woven Gauze Sponge, Non-Sterile 4x4 in 1 x Per Week/30 Days Discharge Instructions: In clinic. Home Health to apply wound vac. Secured With: [employer], 4.5x3.1 (in/yd) 1 x Per Week/30 Days Discharge Instructions: In clinic. Home Health to apply wound vac. Secured With: 35M Medipore H Soft Cloth Surgical T ape, 4 x 10 (in/yd) 1 x Per Week/30 Days Discharge Instructions: In clinic. Home Health to apply wound vac. 02/04/2022: The wound has contracted somewhat and the tunnel has filled in completely. The wound surface is clean. No debridement was necessary. I fenestrated Apligraf #2 and applied it in standard fashion. Approximately 60% of the graft was used. It was secured in place with Adaptic and Steri-Strips. The wound VAC will be applied over this at 100 mmHg suction. He will continue his hyperbaric oxygen therapy. Follow-up in clinic in 1 week. Electronic Signature(s) Signed: 02/04/2022 11:20:15 AM By: [name] MD FACS Entered By: [name] on 02/04/2022 11:20:15 -------------------------------------------------------------------------------- HxROS Details Patient Name: Date of Service: NA [name], [name] A. 02/04/2022 10:00 A M Medical Record Number: [id number] Patient Account Number: [id number] Date of Birth/Sex: Treating RN: 13-Jul-1955 (66 y.o. [name] Primary Care Provider: [name] Other Clinician: Referring Provider: Treating Provider/Extender: [name]: 23 Information Obtained  From Patient Eyes Medical History: Positive for: Cataracts - Removed 2008 Cardiovascular Medical  History: Positive for: Coronary Artery Disease; Hypertension; Myocardial Infarction; Peripheral Arterial Disease Past Medical History Notes: Hypercholesterolemia Abnormal EKG CABG X3 2019 Gastrointestinal Medical History: Past Medical History Notes: GERD Endocrine Medical History: Positive for: Type II Diabetes Time with diabetes: 24 years Treated with: Insulin, Oral agents Blood sugar tested every day: Yes Tested : Musculoskeletal Medical History: Positive for: Osteomyelitis Past Medical History Notes: Diabetic foot ulcer Neurologic Medical History: Positive for: Neuropathy HBO Extended History Items Eyes: Cataracts Immunizations Pneumococcal Vaccine: Received Pneumococcal Vaccination: Yes Received Pneumococcal Vaccination On or After 60th Birthday: Yes Implantable Devices Yes Family and Social History Cancer: Yes - Father; Diabetes: Yes - Father,Mother,Paternal Grandparents; Heart Disease: Yes - Father; Hereditary Spherocytosis: No; Hypertension: Yes - Father; Kidney Disease: No; Lung Disease: No; Seizures: No; Stroke: No; Thyroid Problems: No; Tuberculosis: No; Never smoker; Marital Status - Married; Alcohol Use: Rarely; Drug Use: No History; Caffeine Use: Daily; Financial Concerns: No; Food, Clothing or Shelter Needs: No; Support System Lacking: No; Transportation Concerns: No Psychologist, prison and probation services) Signed: 02/04/2022 1:33:14 PM By: [name] MD FACS Signed: 02/07/2022 5:02:03 PM By: Samuella Bruin Entered By: [name] on 02/04/2022 11:18:54 -------------------------------------------------------------------------------- SuperBill Details Patient Name: Date of Service: NA Colin Nephew A. 02/04/2022 Medical Record Number: [id number] Patient Account Number: [id number] Date of Birth/Sex: Treating RN: [birth year] (66 y.o. 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 Facility Procedures CPT4 Code: [id number] 15 IC Description: (Facility Use Only) Apligraf 1 SQ CM 275 - SKIN SUB GRAFT FACE/NK/HF/G D-10 Diagnosis Description L97.324 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 Modifier: 1 Quantity: 44 Physician Procedures : CPT4 Code Description Modifier [id number] - WC PHYS LEVEL 4 - EST PT ICD-10 Diagnosis Description L97.324 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 E11.621 Type 2 diabetes mellitus with foot ulcer M86.672 Other chronic  osteomyelitis, left ankle and foot E11.610 Type 2 diabetes mellitus with diabetic neuropathic arthropathy Quantity: 1 : [id number] - WC PHYS SKIN SUB GRAFT FACE/NK/HF/G ICD-10 Diagnosis Description L97.324 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

## 2022-02-07 NOTE — Progress Notes (Addendum)
[name] ([id number]) Visit Report for 02/07/2022 HBO Details Patient Name: Date of Service: NA [name] 02/07/2022 8:00 A M Medical Record Number: [id number] Patient Account Number: [id number] Date of Birth/Sex: Treating RN: Jun 08, 1956 (66 y.o. [name] Primary Care Zofia Peckinpaugh: [name] Other Clinician: [name] Referring Wateen Varon: Treating Ashe Gago/Extender: [name]: 23 HBO Treatment Course Details Treatment Course Number: 1 Ordering Tyronne Blann: [name] T Treatments Ordered: otal 120 HBO Treatment Start Date: 09/20/2021 HBO Indication: Diabetic Ulcer(s) of the Lower Extremity Standard/Conservative Wound Care tried and failed greater than or equal to 30 days HBO Treatment Details Treatment Number: 91 Patient Type: Outpatient Chamber Type: Monoplace Chamber Serial #: [id number] Treatment Protocol: 2.0 ATA with 90 minutes oxygen, with two 5 minute air breaks Treatment Details Compression Rate Down: 2.0 psi / minute De-Compression Rate Up: 2.0 psi / minute A breaks and breathing ir Compress Tx Pressure periods Decompress Decompress Begins Reached (leave unused spaces Begins Ends blank) Chamber Pressure (ATA 1 2 2 2 2 2  --2 1 ) Clock Time (24 hr) 08:49 09:00 09:30 09:36 10:06 10:11 - - 10:41 10:50 Treatment Length: 121 (minutes) Treatment Segments: 4 Vital Signs Capillary Blood Glucose Reference Range: 80 - 120 mg / dl HBO Diabetic Blood Glucose Intervention Range: <131 mg/dl or mg/dl Time Vitals Blood Respiratory Capillary Blood Glucose Pulse Action Type: Pulse: Temperature: Taken: Pressure: Rate: Glucose (mg/dl): Meter #: Oximetry (%) Taken: Pre 08:44 140/77 78 16 97.5 240 Post 10:54 165/97 68 16 98.1 204 Treatment Response Treatment Toleration: Well Treatment Completion Status: Treatment Completed without Adverse Event Treatment Notes NPWT suction device came loose during HBO therapy  and was replaced prior to completing HBO therapy for today, 02/07/22. Additional Procedure Documentation Tissue Sevierity: Necrosis of bone Physician HBO Attestation: I certify that I supervised this HBO treatment in accordance with Medicare guidelines. A trained emergency response team is readily available per Yes hospital policies and procedures. Continue HBOT as ordered. Yes Electronic Signature(s) Signed: 02/07/2022 1:46:48 PM By: 02/09/2022 MD FACS Previous Signature: 02/07/2022 12:24:33 PM Version By: 02/09/2022 EMT Entered By: [name] on 02/07/2022 13:46:47 -------------------------------------------------------------------------------- HBO Safety Checklist Details Patient Name: Date of Service: 02/09/2022 A. 02/07/2022 8:00 A M Medical Record Number: 02/09/2022 Patient Account Number: [id number] Date of Birth/Sex: Treating RN: 1955/11/08 (66 y.o. 77 Primary Care [name]: [name] Other Clinician: [name] Referring Harace Mccluney: Treating Hanni Milford/Extender: [name]: 23 HBO Safety Checklist Items Safety Checklist Consent Form Signed Patient voided / foley secured and emptied When did you last eato 0630 Last dose of injectable or oral agent 0630 Ostomy pouch emptied and vented if applicable NA All implantable devices assessed, documented and approved NA Intravenous access site secured and place NA Valuables secured Linens and cotton and cotton/polyester blend (less than 51% polyester) Personal oil-based products / skin lotions / body lotions removed Wigs or hairpieces removed NA Smoking or tobacco materials removed Books / newspapers / magazines / loose paper removed Cologne, aftershave, perfume and deodorant removed Jewelry removed (may wrap wedding band) NA Make-up removed NA Hair care products removed Battery operated devices (external) removed Heating patches and chemical  warmers removed Titanium eyewear removed NA Nail polish cured greater than 10 hours NA Casting material cured greater than 10 hours NA Hearing aids removed NA Loose dentures or partials removed NA Prosthetics have been removed NA Patient demonstrates correct use of air break  device (if applicable) Patient concerns have been addressed Patient grounding bracelet on and cord attached to chamber Specifics for Inpatients (complete in addition to above) Medication sheet sent with patient NA Intravenous medications needed or due during therapy sent with patient NA Drainage tubes (e.g. nasogastric tube or chest tube secured and vented) NA Endotracheal or Tracheotomy tube secured NA Cuff deflated of air and inflated with saline NA Airway suctioned NA Notes The safety checklist was done before the treatment was started Electronic Signature(s) Signed: 02/07/2022 12:19:47 PM By: [name] EMT Entered By: [name] on 02/07/2022 12:19:47

## 2022-02-07 NOTE — Progress Notes (Signed)
DWAINE, PRINGLE ([id number]) Visit Report for 02/07/2022 Problem List Details Patient Name: Date of Service: NA [name] 02/07/2022 8:00 A M Medical Record Number: [id number] Patient Account Number: [id number] Date of Birth/Sex: Treating RN: 11-08-1955 (66 y.o. 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 02/07/2022 Medical Record Number: [id number] Patient Account Number: [id number] Date of Birth/Sex: Treating RN: Aug 20, 1955 (66 y.o. 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 Facility Procedures CPT4 Code: 47092957 Description: G0277-(Facility Use Only) HBOT full body chamber, , ICD-10 Diagnosis Description E11.621 Type 2 diabetes mellitus with foot ulcer 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 M86.672 Other chronic  osteomyelitis, left ankle and foot 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

## 2022-02-08 ENCOUNTER — Encounter (HOSPITAL_BASED_OUTPATIENT_CLINIC_OR_DEPARTMENT_OTHER): Payer: BC Managed Care – PPO | Admitting: General Surgery

## 2022-02-08 DIAGNOSIS — L97324 Non-pressure chronic ulcer of left ankle with necrosis of bone: Secondary | ICD-10-CM | POA: Diagnosis not present

## 2022-02-08 DIAGNOSIS — E11621 Type 2 diabetes mellitus with foot ulcer: Secondary | ICD-10-CM | POA: Diagnosis not present

## 2022-02-08 DIAGNOSIS — Z951 Presence of aortocoronary bypass graft: Secondary | ICD-10-CM | POA: Diagnosis not present

## 2022-02-08 DIAGNOSIS — E1151 Type 2 diabetes mellitus with diabetic peripheral angiopathy without gangrene: Secondary | ICD-10-CM | POA: Diagnosis not present

## 2022-02-08 DIAGNOSIS — E11610 Type 2 diabetes mellitus with diabetic neuropathic arthropathy: Secondary | ICD-10-CM | POA: Diagnosis not present

## 2022-02-08 DIAGNOSIS — I2510 Atherosclerotic heart disease of native coronary artery without angina pectoris: Secondary | ICD-10-CM | POA: Diagnosis not present

## 2022-02-08 DIAGNOSIS — T8131XA Disruption of external operation (surgical) wound, not elsewhere classified, initial encounter: Secondary | ICD-10-CM | POA: Diagnosis not present

## 2022-02-08 DIAGNOSIS — E1140 Type 2 diabetes mellitus with diabetic neuropathy, unspecified: Secondary | ICD-10-CM | POA: Diagnosis not present

## 2022-02-08 DIAGNOSIS — M86672 Other chronic osteomyelitis, left ankle and foot: Secondary | ICD-10-CM | POA: Diagnosis not present

## 2022-02-08 LAB — GLUCOSE, CAPILLARY
Glucose-Capillary: 187 mg/dL — ABNORMAL HIGH (ref 70–99)
Glucose-Capillary: 140 mg/dL — ABNORMAL HIGH (ref 70–99)

## 2022-02-08 NOTE — Progress Notes (Signed)
Colin, [name] ([id number]) Visit Report for 02/08/2022 Arrival Information Details Patient Name: Date of Service: NA [name] 02/08/2022 8:00 A M Medical Record Number: [id number] Patient Account Number: [id number] Date of Birth/Sex: Treating RN: [birth year] (66 y.o. [name], [name] Primary Care Jazarah Capili: [name] Other Clinician: [name] Referring Thersia Petraglia: Treating Timtohy Broski/Extender: [name] in Treatment: 23 Visit Information History Since Last Visit All ordered tests and consults were completed: Yes Patient Arrived: Knee Scooter Added or deleted any medications: No Arrival Time: 08:00 Any new allergies or adverse reactions: No Accompanied By: Wife Had a fall or experienced change in No Transfer Assistance: None activities of daily living that may affect Patient Identification Verified: Yes risk of falls: Secondary Verification Process Completed: Yes Signs or symptoms of abuse/neglect since last visito No Patient Requires Transmission-Based Precautions: No Hospitalized since last visit: No Patient Has Alerts: Yes Implantable device outside of the clinic excluding No Patient Alerts: Patient on Blood Thinner cellular tissue based products placed in the center since last visit: Pain Present Now: No Electronic Signature(s) Signed: 02/08/2022 3:51:53 PM By: [name] EMT Entered By: [name] on 02/08/2022 15:51:52 -------------------------------------------------------------------------------- Encounter Discharge Information Details Patient Name: Date of Service: NA [name] 02/08/2022 8:00 A M Medical Record Number: [id number] Patient Account Number: [id number] Date of Birth/Sex: Treating RN: 1956-01-01 (66 y.o. [name] Primary Care [name] Rossman: [name] Other Clinician: [name] Referring Tamyka Bezio: Treating Ludwig Tugwell/Extender: [name] in Treatment: 23 Encounter Discharge  Information Items Discharge Condition: Stable Ambulatory Status: Knee Scooter Discharge Destination: Home Transportation: Private Auto Accompanied By: Wife Schedule Follow-up Appointment: Yes Clinical Summary of Care: Electronic Signature(s) Signed: 02/08/2022 3:56:23 PM By: [name] EMT Entered By: [name] on 02/08/2022 15:56:23 -------------------------------------------------------------------------------- Vitals Details Patient Name: Date of Service: NA [name] 02/08/2022 8:00 A M Medical Record Number: [id number] Patient Account Number: [id number] Date of Birth/Sex: Treating RN: Jun 25, 1955 (66 y.o. [name], [name] Primary Care Sharise Lippy: [name] Other Clinician: [name] Referring Martez Weiand: Treating Charlann Wayne/Extender: [name]: 23 Vital Signs Time Taken: 08:30 Temperature (F): 98.2 Height (in): 74 Pulse (bpm): 76 Weight (lbs): 186 Respiratory Rate (breaths/min): 16 Body Mass Index (BMI): 23.9 Blood Pressure (mmHg): 118/79 Capillary Blood Glucose (mg/dl): 427 Reference Range: 80 - 120 mg / dl Electronic Signature(s) Signed: 02/08/2022 3:52:22 PM By: [name] EMT Entered By: [name] on 02/08/2022 15:52:22

## 2022-02-08 NOTE — Progress Notes (Addendum)
MURRELL, DOME ([id number]) Visit Report for 02/08/2022 HBO Details Patient Name: Date of Service: NA [name] 02/08/2022 8:00 A M Medical Record Number: [id number] Patient Account Number: [id number] Date of Birth/Sex: Treating RN: 27-Nov-1955 (66 y.o. [name], [name] Primary Care [name]: [name] Other Clinician: [name] Referring [name]: Treating [name]/Extender: [name] Weeks in Treatment: 23 HBO Treatment Course Details Treatment Course Number: 1 Ordering [name]: Duanne [name] T Treatments Ordered: otal 120 HBO Treatment Start Date: 09/20/2021 HBO Indication: Diabetic Ulcer(s) of the Lower Extremity Standard/Conservative Wound Care tried and failed greater than or equal to 30 days HBO Treatment Details Treatment Number: 92 Patient Type: Outpatient Chamber Type: Monoplace Chamber Serial #: [id number] Treatment Protocol: 2.0 ATA with 90 minutes oxygen, with two 5 minute air breaks Treatment Details Compression Rate Down: 2.0 psi / minute De-Compression Rate Up: 2.0 psi / minute A breaks and breathing ir Compress Tx Pressure periods Decompress Decompress Begins Reached (leave unused spaces Begins Ends blank) Chamber Pressure (ATA 1 2 2 2 2 2  --2 1 ) Clock Time (24 hr) 08:33 08:45 09:15 09:20 09:50 09:55 - - 10:25 10:36 Treatment Length: 123 (minutes) Treatment Segments: 4 Vital Signs Capillary Blood Glucose Reference Range: 80 - 120 mg / dl HBO Diabetic Blood Glucose Intervention Range: <131 mg/dl or mg/dl Time Vitals Blood Respiratory Capillary Blood Glucose Pulse Action Type: Pulse: Temperature: Taken: Pressure: Rate: Glucose (mg/dl): Meter #: Oximetry (%) Taken: Pre 08:30 118/79 76 16 98.2 187 Post 10:39 163/91 67 18 97.4 140 Treatment Response Treatment Toleration: Well Treatment Completion Status: Treatment Completed without Adverse Event Additional Procedure Documentation Tissue Sevierity: Necrosis of  bone Physician HBO Attestation: I certify that I supervised this HBO treatment in accordance with Medicare guidelines. A trained emergency response team is readily available per Yes hospital policies and procedures. Continue HBOT as ordered. Yes Electronic Signature(s) Signed: 02/08/2022 4:35:41 PM By: 02/10/2022 MD FACS Previous Signature: 02/08/2022 3:54:59 PM Version By: 02/10/2022 EMT Entered By: [name] on 02/08/2022 16:35:41 -------------------------------------------------------------------------------- HBO Safety Checklist Details Patient Name: Date of Service: 02/10/2022 A. 02/08/2022 8:00 A M Medical Record Number: 02/10/2022 Patient Account Number: [id number] Date of Birth/Sex: Treating RN: 1955/10/19 (66 y.o. 79, [name] Primary Care [name]: [name] Other Clinician: [name] Referring [name]: Treating [name]/Extender: [name] Weeks in Treatment: 23 HBO Safety Checklist Items Safety Checklist Consent Form Signed Patient voided / foley secured and emptied When did you last eato 0645 Last dose of injectable or oral agent 0645 Ostomy pouch emptied and vented if applicable NA All implantable devices assessed, documented and approved NA Intravenous access site secured and place NA Valuables secured Linens and cotton and cotton/polyester blend (less than 51% polyester) Personal oil-based products / skin lotions / body lotions removed Wigs or hairpieces removed NA Smoking or tobacco materials removed Books / newspapers / magazines / loose paper removed Cologne, aftershave, perfume and deodorant removed Jewelry removed (may wrap wedding band) NA Make-up removed NA Hair care products removed Battery operated devices (external) removed Heating patches and chemical warmers removed Titanium eyewear removed NA Nail polish cured greater than 10 hours NA Casting material cured greater than 10  hours NA Hearing aids removed NA Loose dentures or partials removed NA Prosthetics have been removed NA Patient demonstrates correct use of air break device (if applicable) Patient concerns have been addressed Patient grounding bracelet on and cord attached to chamber Specifics for Inpatients (complete  in addition to above) Medication sheet sent with patient NA Intravenous medications needed or due during therapy sent with patient NA Drainage tubes (e.g. nasogastric tube or chest tube secured and vented) NA Endotracheal or Tracheotomy tube secured NA Cuff deflated of air and inflated with saline NA Airway suctioned NA Notes The safety checklist was done before the treatment was started Electronic Signature(s) Signed: 02/08/2022 3:53:26 PM By: [name] EMT Entered By: [name] on 02/08/2022 15:53:26

## 2022-02-08 NOTE — Progress Notes (Signed)
Colin, [name] ([id number]) Visit Report for 02/08/2022 Problem List Details Patient Name: Date of Service: NA [name] 02/08/2022 8:00 A M Medical Record Number: [id number] Patient Account Number: [id number] Date of Birth/Sex: Treating RN: 1955-11-16 (66 y.o. 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 02/08/2022 Medical Record Number: [id number] Patient Account Number: [id number] Date of Birth/Sex: Treating RN: May 06, 1956 (66 y.o. 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 Facility Procedures CPT4 Code: 39532023 Description: G0277-(Facility Use Only) HBOT full body chamber, , ICD-10 Diagnosis Description E11.621 Type 2 diabetes mellitus with foot ulcer 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 M86.672 Other chronic  osteomyelitis, left ankle and foot 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

## 2022-02-09 ENCOUNTER — Encounter (HOSPITAL_BASED_OUTPATIENT_CLINIC_OR_DEPARTMENT_OTHER): Payer: BC Managed Care – PPO | Admitting: General Surgery

## 2022-02-09 DIAGNOSIS — T8131XA Disruption of external operation (surgical) wound, not elsewhere classified, initial encounter: Secondary | ICD-10-CM | POA: Diagnosis not present

## 2022-02-10 ENCOUNTER — Encounter (HOSPITAL_BASED_OUTPATIENT_CLINIC_OR_DEPARTMENT_OTHER): Payer: BC Managed Care – PPO | Admitting: General Surgery

## 2022-02-10 DIAGNOSIS — I2510 Atherosclerotic heart disease of native coronary artery without angina pectoris: Secondary | ICD-10-CM | POA: Diagnosis not present

## 2022-02-10 DIAGNOSIS — M86672 Other chronic osteomyelitis, left ankle and foot: Secondary | ICD-10-CM | POA: Diagnosis not present

## 2022-02-10 DIAGNOSIS — T8131XA Disruption of external operation (surgical) wound, not elsewhere classified, initial encounter: Secondary | ICD-10-CM | POA: Diagnosis not present

## 2022-02-10 DIAGNOSIS — E1151 Type 2 diabetes mellitus with diabetic peripheral angiopathy without gangrene: Secondary | ICD-10-CM | POA: Diagnosis not present

## 2022-02-10 DIAGNOSIS — E11621 Type 2 diabetes mellitus with foot ulcer: Secondary | ICD-10-CM | POA: Diagnosis not present

## 2022-02-10 DIAGNOSIS — L97324 Non-pressure chronic ulcer of left ankle with necrosis of bone: Secondary | ICD-10-CM | POA: Diagnosis not present

## 2022-02-10 DIAGNOSIS — E1140 Type 2 diabetes mellitus with diabetic neuropathy, unspecified: Secondary | ICD-10-CM | POA: Diagnosis not present

## 2022-02-10 DIAGNOSIS — E11610 Type 2 diabetes mellitus with diabetic neuropathic arthropathy: Secondary | ICD-10-CM | POA: Diagnosis not present

## 2022-02-10 DIAGNOSIS — Z951 Presence of aortocoronary bypass graft: Secondary | ICD-10-CM | POA: Diagnosis not present

## 2022-02-10 LAB — GLUCOSE, CAPILLARY
Glucose-Capillary: 214 mg/dL — ABNORMAL HIGH (ref 70–99)
Glucose-Capillary: 218 mg/dL — ABNORMAL HIGH (ref 70–99)

## 2022-02-10 NOTE — Progress Notes (Addendum)
MUZAMMIL, BRUINS ([id number]) Visit Report for 02/10/2022 Arrival Information Details Patient Name: Date of Service: NA [name] 02/10/2022 8:00 A M Medical Record Number: [id number] Patient Account Number: [id number] Date of Birth/Sex: Treating RN: 12/22/1955 (66 y.o. [name] Primary Care Jackalynn Art: [name] Other Clinician: [name] Referring Demiah Gullickson: Treating Shakemia Madera/Extender: [name] in Treatment: 24 Visit Information History Since Last Visit All ordered tests and consults were completed: Yes Patient Arrived: Knee Scooter Added or deleted any medications: No Arrival Time: 08:12 Any new allergies or adverse reactions: No Accompanied By: Wife Had a fall or experienced change in No Transfer Assistance: None activities of daily living that may affect Patient Identification Verified: Yes risk of falls: Secondary Verification Process Completed: Yes Signs or symptoms of abuse/neglect since last visito No Patient Requires Transmission-Based Precautions: No Hospitalized since last visit: No Patient Has Alerts: Yes Implantable device outside of the clinic excluding No Patient Alerts: Patient on Blood Thinner cellular tissue based products placed in the center since last visit: Pain Present Now: No Electronic Signature(s) Signed: 02/10/2022 12:57:41 PM By: [name] EMT Entered By: [name] on 02/10/2022 12:57:41 -------------------------------------------------------------------------------- Encounter Discharge Information Details Patient Name: Date of Service: NA [name] 02/10/2022 8:00 A M Medical Record Number: [id number] Patient Account Number: [id number] Date of Birth/Sex: Treating RN: 27-Mar-1956 (66 y.o. [name] Primary Care Jocelynne Duquette: [name] Other Clinician: [name] Referring Bronsyn Shappell: Treating Aryiana Klinkner/Extender: [name]: 24 Encounter  Discharge Information Items Discharge Condition: Stable Ambulatory Status: Knee Scooter Discharge Destination: Home Transportation: Private Auto Accompanied By: Wife Schedule Follow-up Appointment: Yes Clinical Summary of Care: Electronic Signature(s) Signed: 02/10/2022 1:14:23 PM By: [name] EMT Entered By: [name] on 02/10/2022 13:14:22 -------------------------------------------------------------------------------- Vitals Details Patient Name: Date of Service: NA [name] 02/10/2022 8:00 A M Medical Record Number: [id number] Patient Account Number: [id number] Date of Birth/Sex: Treating RN: 10/11/1955 (66 y.o. [name] Primary Care Lexington Krotz: [name] Other Clinician: [name] Referring [name] Domine: Treating Dorothey Oetken/Extender: [name]: 24 Vital Signs Time Taken: 08:29 Temperature (F): 97.2 Height (in): 74 Pulse (bpm): 75 Weight (lbs): 186 Respiratory Rate (breaths/min): 16 Body Mass Index (BMI): 23.9 Blood Pressure (mmHg): 138/79 Capillary Blood Glucose (mg/dl): 423 Reference Range: 80 - 120 mg / dl Electronic Signature(s) Signed: 02/10/2022 1:01:05 PM By: [name] EMT Entered By: [name] on 02/10/2022 13:01:04

## 2022-02-10 NOTE — Progress Notes (Signed)
CHASIN, FINDLING ([id number]) Visit Report for 02/10/2022 Problem List Details Patient Name: Date of Service: NA Colin Nephew A. 02/10/2022 8:00 A M Medical Record Number: [id number] Patient Account Number: [id number] Date of Birth/Sex: Treating RN: 1955/07/26 (66 y.o. 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 02/10/2022 Medical Record Number: [id number] Patient Account Number: [id number] Date of Birth/Sex: Treating RN: 1956/01/04 (66 y.o. 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 Facility Procedures CPT4 Code: 52841324 Description: G0277-(Facility Use Only) HBOT full body chamber, , ICD-10 Diagnosis Description E11.621 Type 2 diabetes mellitus with foot ulcer 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 M86.672 Other chronic  osteomyelitis, left ankle and foot 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

## 2022-02-10 NOTE — Progress Notes (Addendum)
KRISTIAN, MOGG ([id number]) Visit Report for 02/10/2022 HBO Details Patient Name: Date of Service: NA [name] 02/10/2022 8:00 A M Medical Record Number: [id number] Patient Account Number: [id number] Date of Birth/Sex: Treating RN: [birth year] (66 y.o. [name] Primary Care Sharlon Pfohl: [name] Other Clinician: [name] Referring Adrik Khim: Treating Lysa Livengood/Extender: [name]: 24 HBO Treatment Course Details Treatment Course Number: 1 Ordering Josiephine Simao: [name] T Treatments Ordered: otal 120 HBO Treatment Start Date: 09/20/2021 HBO Indication: Diabetic Ulcer(s) of the Lower Extremity Standard/Conservative Wound Care tried and failed greater than or equal to 30 days HBO Treatment Details Treatment Number: 93 Patient Type: Outpatient Chamber Type: Monoplace Chamber Serial #: [id number] Treatment Protocol: 2.0 ATA with 90 minutes oxygen, with two 5 minute air breaks Treatment Details Compression Rate Down: 2.0 psi / minute De-Compression Rate Up: 2.0 psi / minute A breaks and breathing ir Compress Tx Pressure periods Decompress Decompress Begins Reached (leave unused spaces Begins Ends blank) Chamber Pressure (ATA 1 2 2 2 2 2  --2 1 ) Clock Time (24 hr) 08:34 08:47 09:17 09:22 09:52 09:57 - - 10:27 10:36 Treatment Length: 122 (minutes) Treatment Segments: 4 Vital Signs Capillary Blood Glucose Reference Range: 80 - 120 mg / dl HBO Diabetic Blood Glucose Intervention Range: <131 mg/dl or mg/dl Time Vitals Blood Respiratory Capillary Blood Glucose Pulse Action Type: Pulse: Temperature: Taken: Pressure: Rate: Glucose (mg/dl): Meter #: Oximetry (%) Taken: Pre 08:29 138/79 75 16 97.2 214 Post 10:39 180/91 63 18 97.5 218 Treatment Response Treatment Toleration: Well Treatment Completion Status: Treatment Completed without Adverse Event Treatment Notes The patient was out sick yesterday. States that he  feels a little stuffy today. Asked Gave patient Afrin 1 spray in each side. I asked Dr. >283 to check his ears. She stated he had wax in both ears. No problems during treatment noted. Additional Procedure Documentation Tissue Sevierity: Necrosis of bone Physician HBO Attestation: I certify that I supervised this HBO treatment in accordance with Medicare guidelines. A trained emergency response team is readily available per Yes hospital policies and procedures. Continue HBOT as ordered. Yes Electronic Signature(s) Signed: 02/10/2022 2:27:56 PM By: 02/12/2022 MD FACS Previous Signature: 02/10/2022 1:12:34 PM Version By: 02/12/2022 EMT Entered By: [name] on 02/10/2022 14:27:56 -------------------------------------------------------------------------------- HBO Safety Checklist Details Patient Name: Date of Service: 02/12/2022 A. 02/10/2022 8:00 A M Medical Record Number: 02/12/2022 Patient Account Number: [id number] Date of Birth/Sex: Treating RN: 1955/09/24 (66 y.o. 77 Primary Care Telesha Deguzman: [name] Other Clinician: [name] Referring Macallan Ord: Treating Salisha Bardsley/Extender: [name]: 24 HBO Safety Checklist Items Safety Checklist Consent Form Signed Patient voided / foley secured and emptied When did you last eato 0630 Last dose of injectable or oral agent 0630 Ostomy pouch emptied and vented if applicable NA All implantable devices assessed, documented and approved NA Intravenous access site secured and place NA Valuables secured Linens and cotton and cotton/polyester blend (less than 51% polyester) Personal oil-based products / skin lotions / body lotions removed Wigs or hairpieces removed NA Smoking or tobacco materials removed Books / newspapers / magazines / loose paper removed Cologne, aftershave, perfume and deodorant removed Jewelry removed (may wrap wedding band) NA Make-up  removed NA Hair care products removed Battery operated devices (external) removed Heating patches and chemical warmers removed Titanium eyewear removed NA Nail polish cured greater than 10 hours NA Casting material cured greater than  10 hours NA Hearing aids removed NA Loose dentures or partials removed NA Prosthetics have been removed NA Patient demonstrates correct use of air break device (if applicable) Patient concerns have been addressed Patient grounding bracelet on and cord attached to chamber Specifics for Inpatients (complete in addition to above) Medication sheet sent with patient NA Intravenous medications needed or due during therapy sent with patient NA Drainage tubes (e.g. nasogastric tube or chest tube secured and vented) NA Endotracheal or Tracheotomy tube secured NA Cuff deflated of air and inflated with saline NA Airway suctioned NA Notes The safety checklist was done before the treatment was started Electronic Signature(s) Signed: 02/10/2022 1:03:38 PM By: [name] EMT Entered By: [name] on 02/10/2022 13:03:38

## 2022-02-11 ENCOUNTER — Encounter (HOSPITAL_BASED_OUTPATIENT_CLINIC_OR_DEPARTMENT_OTHER): Payer: BC Managed Care – PPO | Attending: General Surgery | Admitting: General Surgery

## 2022-02-11 ENCOUNTER — Encounter (HOSPITAL_BASED_OUTPATIENT_CLINIC_OR_DEPARTMENT_OTHER): Payer: BC Managed Care – PPO | Admitting: General Surgery

## 2022-02-11 DIAGNOSIS — L97324 Non-pressure chronic ulcer of left ankle with necrosis of bone: Secondary | ICD-10-CM | POA: Diagnosis not present

## 2022-02-11 DIAGNOSIS — E11621 Type 2 diabetes mellitus with foot ulcer: Secondary | ICD-10-CM | POA: Diagnosis not present

## 2022-02-11 DIAGNOSIS — E11610 Type 2 diabetes mellitus with diabetic neuropathic arthropathy: Secondary | ICD-10-CM | POA: Diagnosis not present

## 2022-02-11 DIAGNOSIS — M86672 Other chronic osteomyelitis, left ankle and foot: Secondary | ICD-10-CM | POA: Insufficient documentation

## 2022-02-11 DIAGNOSIS — I2510 Atherosclerotic heart disease of native coronary artery without angina pectoris: Secondary | ICD-10-CM | POA: Insufficient documentation

## 2022-02-11 DIAGNOSIS — T8131XA Disruption of external operation (surgical) wound, not elsewhere classified, initial encounter: Secondary | ICD-10-CM | POA: Diagnosis not present

## 2022-02-11 LAB — GLUCOSE, CAPILLARY
Glucose-Capillary: 134 mg/dL — ABNORMAL HIGH (ref 70–99)
Glucose-Capillary: 169 mg/dL — ABNORMAL HIGH (ref 70–99)

## 2022-02-11 NOTE — Progress Notes (Signed)
[name], [name] ([id number]) Visit Report for 02/11/2022 Chief Complaint Document Details Patient Name: Date of Service: NA [name] 02/11/2022 10:45 A M Medical Record Number: [id number] Patient Account Number: [id number] Date of Birth/Sex: Treating RN: April 29, 1956 (66 y.o. [name] Primary Care Provider: [name] Other Clinician: Referring Provider: Treating Provider/Extender: [name] in Treatment: 24 Information Obtained from: Patient Chief Complaint Patients presents for treatment of an open diabetic ulcer with exposed bone and osteomyelitis Electronic Signature(s) Signed: 02/11/2022 11:34:17 AM By: Colin Maudlin MD FACS Entered By: [name] on 02/11/2022 11:34:17 -------------------------------------------------------------------------------- Cellular or Tissue Based Product Details Patient Name: Date of Service: NA [name] A. 02/11/2022 10:45 A M Medical Record Number: [id number] Patient Account Number: [id number] Date of Birth/Sex: Treating RN: 11-05-1955 (66 y.o. [name] Primary Care Provider: [name] Other Clinician: Referring Provider: Treating Provider/Extender: [name] in Treatment: 24 Cellular or Tissue Based Product Type Wound #1 Left,Medial Foot Applied to: Performed By: Physician Colin Maudlin, MD Cellular or Tissue Based Product Type: Apligraf Level of Consciousness (Pre-procedure): Awake and Alert Pre-procedure Verification/Time Out Yes - 11:26 Taken: Location: genitalia / hands / feet / multiple digits Wound Size (sq cm): 13.68 Product Size (sq cm): 44 Waste Size (sq cm): 22 Waste Reason: wound size Amount of Product Applied (sq cm): 22 Instrument Used: Forceps, Scissors Lot #: [id number] Expiration Date: 02/18/2022 Fenestrated: Yes Instrument: Blade Reconstituted: Yes Solution Type: normal saline Solution Amount: 6 ml Lot #:  [id number] Solution Expiration Date: 03/13/2022 Secured: Yes Secured With: Steri-Strips Dressing Applied: Yes Primary Dressing: adaptic Procedural Pain: 0 Post Procedural Pain: 0 Response to Treatment: Procedure was tolerated well Level of Consciousness (Post- Awake and Alert procedure): Post Procedure Diagnosis Same as Pre-procedure Electronic Signature(s) Signed: 02/11/2022 1:39:13 PM By: Colin Maudlin MD FACS Signed: 02/11/2022 4:42:04 PM By: [name] Entered By: [name] on 02/11/2022 13:23:21 -------------------------------------------------------------------------------- Debridement Details Patient Name: Date of Service: NA [name] A. 02/11/2022 10:45 A M Medical Record Number: [id number] Patient Account Number: [id number] Date of Birth/Sex: Treating RN: Nov 20, 1955 (66 y.o. [name] Primary Care Provider: [name] Other Clinician: Referring Provider: Treating Provider/Extender: [name] Weeks in Treatment: 24 Debridement Performed for Assessment: Wound #1 Left,Medial Foot Performed By: Physician Colin Maudlin, MD Debridement Type: Debridement Severity of Tissue Pre Debridement: Fat layer exposed Level of Consciousness (Pre-procedure): Awake and Alert Pre-procedure Verification/Time Out Yes - 11:24 Taken: Start Time: 11:24 T Area Debrided (L x W): otal 5.7 (cm) x 2.4 (cm) = 13.68 (cm) Tissue and other material debrided: [place]len Level: Non-Viable Tissue Debridement Description: Selective/Open Wound Instrument: Curette Bleeding: Minimum Hemostasis Achieved: Pressure Procedural Pain: 0 Post Procedural Pain: 0 Response to Treatment: Procedure was tolerated well Level of Consciousness (Post- Awake and Alert procedure): Post Debridement Measurements of Total Wound Length: (cm) 5.7 Width: (cm) 2.4 Depth: (cm) 0.1 Volume: (cm) 1.074 Character of Wound/Ulcer Post Debridement: Improved Severity of  Tissue Post Debridement: Fat layer exposed Post Procedure Diagnosis Same as Pre-procedure Electronic Signature(s) Signed: 02/11/2022 11:38:22 AM By: Colin Maudlin MD FACS Signed: 02/11/2022 4:42:04 PM By: [name] Entered By: [name] on 02/11/2022 11:26:30 -------------------------------------------------------------------------------- HPI Details Patient Name: Date of Service: NA [name] A. 02/11/2022 10:45 A M Medical Record Number: [id number] Patient Account Number: [id number] Date of Birth/Sex: Treating RN: 02-02-1956 (66 y.o. [name] Primary Care Provider: [name] Other Clinician: Referring Provider: Treating  Provider/Extender: [name] Weeks in Treatment: 24 History of Present Illness HPI Description: ADMISSION 08/25/2021 This is a 66 year old man who initially presented to his primary care provider in September 2022 with pain in his left foot. He was sent for an x-ray and while the x-ray was being performed, the tech pointed out a wound on his foot that the patient was not aware existed. He does have type 2 diabetes with significant neuropathy. His diabetes is suboptimally controlled with his most recent A1c being 8.5. He also has a history of coronary artery disease status post three- vessel CABG. he was initially seen by orthopedics, but they referred him to [facility]. He has undergone at least 7 operations/debridements and several applications of skin substitute under the care of podiatry. He has been in a wound VAC for much of this time. His most recent procedure was July 28, 2021. A portion of the talus was biopsied and was found to be consistent with osteomyelitis. Culture also returned positive for corynebacterium. He was seen on August 16, 2021 by infectious disease. A PICC line has been placed and he will be receiving a 6-week course of IV daptomycin and cefepime. In October 2022, he underwent  lower extremity vascular studies. Results are copied here: Right: Resting right ankle-brachial index is within normal range. No evidence of significant right lower extremity arterial disease. The right toe-brachial index is abnormal. Left: Resting left ankle-brachial index indicates mild left lower extremity arterial disease. The left toe-brachial index is abnormal. He has not been seen by vascular surgery despite these findings. He presented to clinic today in a cam boot and is using a knee scooter to offload. Wound VAC was in place. Once this was removed, a large ulcer was identified on the left midfoot/ankle. Bone is frankly exposed. There is no malodorous or purulent drainage. There is some granulation tissue over the central portion of the exposed bone. There is a tunnel that extends posteriorly for roughly 10 cm. It has been discussed with him by multiple providers that he is at very high risk of losing his lower leg because of this wound. He is extremely eager to avoid this outcome and is here today to review his options as well as receive ongoing wound care. 09/03/2021: Here for reevaluation of his wound. There does not appear to have been any substantial improvement overall since our last visit. He has been in a wound VAC with white foam overlying the exposed bone. We are working on getting him approved for hyperbaric oxygen therapy. 09/10/2021: We are in the process of getting him cleared to begin hyperbaric oxygen therapy. He still needs to obtain a chest x-ray. Although the wound measurements are roughly the same, I think the overall appearance of the wound is better. The exposed bone has a bit more granulation tissue covering it. He has not received a vascular surgery appointment to reevaluate his flow to the wound. 09/17/2021: He has been approved for hyperbaric oxygen therapy and completed his chest x-ray, which I reviewed and it appears normal. The tunnels at the 12 and 10:00 positions  are smaller. There is more granulation tissue covering the exposed bone and the undermining has decreased. He still has not received a vascular surgery appointment. 09/24/2021: He initiated hyperbaric oxygen therapy this week and is tolerating it well. He has an appointment with vascular surgery coming up on May 16. The granulation tissue is covering more of the exposed bone and both tunnels are  a bit smaller. 10/01/2021: He continues to tolerate hyperbaric oxygen therapy. He saw infectious disease and they are planning to pull his PICC line. He has been initiated on oral antibiotics (doxycycline and Augmentin). The wound looks about the same but the tunnels are a little bit smaller. The skin seems to be contracting somewhat around the exposed bone. 10/08/2021: The wound is still about the same size, but the tunnels continue to come in and the skin is contracting around the exposed bone. He continues to have some accumulation of necrotic material in the inferoposterior aspect of the wound as well as accumulation at the 12:00 tunnel area. 10/15/2021: The wound is smaller today. The tunnels continue to come in. There is less necrotic tissue present. He does have some periwound maceration. 10/22/2021: The wound is about the same size. There is a little bit less undermining at the distal portion. The exposed bone is dark and I am not sure if this is staining from silver nitrate or his VAC sponge or if it represents necrosis. The tunnels are shallower but he does have some serous drainage coming from the 10:00 tunnel. He continues to tolerate hyperbaric oxygen therapy well. 10/29/2021: The undermining continues to improve. The tunnels are about the same. He has good granulation tissue overlying the majority of the exposed bone. It does appear that perhaps the tubing from his wound VAC has been eroding the skin at the 12 clock position. He continues to accumulate senescent epithelium around the borders of the  wound. 11/05/2021: The undermining is almost completely resolved. The tunnels have contracted fairly significantly. No significant slough or debris accumulation. There is still senescent epithelium accumulation around the borders of the wound. He has been tolerating hyperbaric oxygen therapy well. 11/12/2021: Despite the measurements of the wound being about the same, the wound has changed in its shape and overall, I think it is improved. The undermining has resolved and the tunnels continue to shorten. There is good granulation tissue encroaching over the small area of bone that has remained exposed at the 12 o'clock position. Minimal slough accumulation. He continues to tolerate hyperbaric oxygen therapy well. 11/19/2021: I took a PCR culture last week. There was overgrowth of yeast. He is already taking suppressive doxycycline and Augmentin. I added fluconazole to his regimen. The wound is smaller again today. The tunnels continue to shorten. He continues to do well with hyperbaric oxygen therapy. 11/26/2021: For some reason, his foot has become macerated. The wound is narrower but about the same dimensions in its longitudinal aspect. The tunnels continue to shorten. He has some slough buildup on the wound as well as some heaped up senescent epithelium around the perimeter. 12/03/2021: No further maceration of his foot has occurred. The wound has contracted quite significantly from last week. The tunnel at 10:00 is closed. The tunnel at 12:00 is down to just a couple of millimeters. No other undermining is present. There is soft tissue coverage of the previously exposed bone. There is just a bit of slough and biofilm on the wound surface. 12/10/2021: The wound is looking good. It turns out the tunnel at 12:00 is only exposed when the patient dorsiflexes his foot. It is about 2 cm in depth when he does this; when his foot is in plantarflexion, the tunnel is closed. The bone that was visible at the 12:00  tunnel is completely covered with granulation tissue, but there does feel like some exposed bone deeper into the tunnel area. There is senescent skin heaped  up around the periphery. Minimal slough on the wound surface. 12/16/2021: The wound dimensions are roughly the same. The surface has nice granulation tissue. The exposed bone at the 12:00 tunnel continues to be covered with more soft tissue. 7/14; patient's wound measures smaller today. Using the wound VAC with underlying collagen. He is also being treated with HBO for underlying osteomyelitis. He tells me he is on doxycycline and ampicillin follows with infectious disease next week 12/31/2021: The wound continues to contract. Unfortunately, the area where the track pad and tubing have been rubbing continues to look like it is applying friction. He says that the home health nurses that have been applying the [facility] have been putting gauze underneath the tubing, but nonetheless there is ongoing tissue breakdown at this site. Light slough accumulation on the wound surface. The tunnel continues to contract. He is tolerating HBO without difficulty. 01/07/2022: Bridging the wound VAC away from the ankle has resulted in significant improvement in the tissue at the apex of the wound. The tunnel is still present and is not all that much shorter, but the overall wound surface is very robust and healthy looking. Minimal slough accumulation. No concern for acute infection. 01/21/2022: The wound continues to contract and has a robust granulation tissue surface. The tunnel has come in considerably and is down to about 1.4 cm. There is still bone exposed within the tunnel but the rest of it is well covered. There is some senescent epithelium at the wound margins and a little bit of slough on the surface. 01/28/2022: No significant change in the wound this week, but there has not been any reaccumulation of senescent epithelium or slough. The tunnel is perhaps  a millimeter less in depth. He has been approved for Apligraf and we will apply this today. 02/04/2022: The wound has contracted somewhat and the tunnel has filled in completely. The wound surface is clean. He is here for Apligraf #2. 02/11/2022: The wound has contracted further and is now nearly flush with the surrounding skin surface. Light layer of slough. Apligraf #3 plan for today. Electronic Signature(s) Signed: 02/11/2022 11:34:55 AM By: Colin Maudlin MD FACS Entered By: [name] on 02/11/2022 11:34:54 -------------------------------------------------------------------------------- Physical Exam Details Patient Name: Date of Service: NA [name] 02/11/2022 10:45 A M Medical Record Number: [id number] Patient Account Number: [id number] Date of Birth/Sex: Treating RN: 1956-01-12 (66 y.o. [name] Primary Care Provider: [name] Other Clinician: Referring Provider: Treating Provider/Extender: [name] Weeks in Treatment: 24 Constitutional Hypertensive, asymptomatic. . . . No acute distress.Marland Kitchen Respiratory Normal work of breathing on room air.. Notes 02/11/2022: The wound has contracted further and is now nearly flush with the surrounding skin surface. Light layer of slough. Electronic Signature(s) Signed: 02/11/2022 11:36:22 AM By: Colin Maudlin MD FACS Entered By: [name] on 02/11/2022 11:36:22 -------------------------------------------------------------------------------- Physician Orders Details Patient Name: Date of Service: NA [name] A. 02/11/2022 10:45 A M Medical Record Number: [id number] Patient Account Number: [id number] Date of Birth/Sex: Treating RN: [birth year] (65 y.o. 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 - Dr. Celine Ahr - Room 2 - 9/15 at 10:45 AM Return A Cellular or Tissue Based Products Wound #1 Left,Medial Foot Cellular or Tissue Based Product Type: - Apligraf #3 Cellular or Tissue Based Product applied to wound bed, secured with steri-strips, cover with Adaptic or Mepitel. (DO NOT REMOVE). Bathing/ Shower/ Hygiene May shower with protection but do not get wound dressing(s) wet. Negative Presssure Wound Therapy Black Foam Other: - WOUND VAC CONTINUOUSLY AT 100MM/HG PRESSURE Edema Control - Lymphedema / SCD / Other Elevate legs to the level of the heart or above for 30 minutes daily and/or when sitting, a frequency of: Hyperbaric Oxygen Therapy Evaluate for HBO Therapy Indication: - Wagner 3 diabetic ulcer left foot If appropriate for treatment, begin HBOT per protocol: 2.5 ATA for 90 Minutes with 2 Five (5) Minute A Breaks ir Total Number of Treatments: - 12/31/2021 additional 40 treatments. T of 120 otal One treatments per day (delivered Monday through Friday unless otherwise specified in Special Instructions below): Finger stick Blood Glucose Pre- and Post- HBOT Treatment. Follow Hyperbaric Oxygen Glycemia Protocol A frin (Oxymetazoline HCL) 0.05% nasal spray - 1 spray in both nostrils daily as needed prior to HBO treatment for difficulty clearing ears Wound Treatment Wound #1 - Foot Wound Laterality: Left, Medial Cleanser: Wound Cleanser 1 x Per Week/30 Days Discharge Instructions: Cleanse the wound with wound cleanser prior to applying a clean dressing using gauze sponges, not tissue or cotton  balls. Prim Dressing: Apligraf 1 x Per Week/30 Days ary Discharge Instructions: DO NOT REMOVE Secondary Dressing: Woven Gauze Sponge, Non-Sterile 4x4 in 1 x Per Week/30 Days Discharge Instructions: In clinic. Home Health to apply wound vac. Secured With: [employer], 4.5x3.1 (in/yd) 1 x Per Week/30 Days Discharge Instructions: In clinic. Home Health to apply wound vac. Secured With: 50M Medipore H Soft Cloth Surgical T ape, 4 x 10 (in/yd) 1 x Per Week/30 Days Discharge Instructions: In clinic. Home Health to apply wound vac. GLYCEMIA INTERVENTIONS PROTOCOL PRE-HBO GLYCEMIA INTERVENTIONS ACTION INTERVENTION Obtain pre-HBO capillary blood glucose (ensure 1 physician order is in chart). A. Notify HBO physician and await physician orders. 2 If result is 70 mg/dl or below: B. If the result meets the hospital definition of a critical result, follow hospital policy. A. Give patient an 8 ounce Glucerna Shake, an 8 ounce Ensure, or 8 ounces of a Glucerna/Ensure equivalent dietary supplement*. B. Wait 30 minutes. If result is 71 mg/dl to 130 mg/dl: C. Retest patients capillary blood glucose (CBG). D. If result greater than or equal to 110 mg/dl, proceed with HBO. If result less than 110 mg/dl, notify HBO physician and consider holding HBO. If result is 131 mg/dl to 249 mg/dl: A. Proceed with HBO. A. Notify HBO physician and await physician orders. B. It is recommended to hold HBO and do If result is 250 mg/dl or greater: blood/urine ketone testing. C. If the result meets the hospital definition of a critical result, follow hospital policy. POST-HBO GLYCEMIA INTERVENTIONS ACTION INTERVENTION Obtain post HBO capillary blood glucose (ensure 1 physician order is in chart). A. Notify HBO physician and await physician orders. 2 If result is 70 mg/dl or below: B. If the result meets the hospital definition of a critical result, follow hospital policy. A. Give patient an 8  ounce Glucerna Shake, an 8 ounce Ensure, or 8 ounces of a Glucerna/Ensure equivalent dietary supplement*. B. Wait 15 minutes for symptoms of If result is  71 mg/dl to 100 mg/dl: hypoglycemia (i.e. nervousness, anxiety, sweating, chills, clamminess, irritability, confusion, tachycardia or dizziness). C. If patient asymptomatic, discharge patient. If patient symptomatic, repeat capillary blood glucose (CBG) and notify HBO physician. If result is 101 mg/dl to 249 mg/dl: A. Discharge patient. A. Notify HBO physician and await physician orders. B. It is recommended to do blood/urine ketone If result is 250 mg/dl or greater: testing. C. If the result meets the hospital definition of a critical result, follow hospital policy. *Juice or candies are NOT equivalent products. If patient refuses the Glucerna or Ensure, please consult the hospital dietitian for an appropriate substitute. Electronic Signature(s) Signed: 02/11/2022 1:39:13 PM By: Colin Maudlin MD FACS Signed: 02/11/2022 4:42:04 PM By: [name] Entered By: [name] on 02/11/2022 11:55:09 -------------------------------------------------------------------------------- Problem List Details Patient Name: Date of Service: NA [name] A. 02/11/2022 10:45 A M Medical Record Number: [id number] Patient Account Number: [id number] Date of Birth/Sex: Treating RN: [birth year] (66 y.o. [name] Primary Care Provider: [name] Other Clinician: Referring Provider: Treating Provider/Extender: [name] Weeks in Treatment: 24 Active Problems ICD-10 Encounter Code Description Active Date MDM Diagnosis [id number] Other chronic osteomyelitis, left ankle and foot 08/25/2021 No Yes E11.610 Type 2 diabetes mellitus with diabetic neuropathic arthropathy 08/25/2021 No Yes E11.621 Type 2 diabetes mellitus with foot ulcer 08/25/2021 No Yes I25.10 Atherosclerotic heart disease of native coronary  artery without angina pectoris 08/25/2021 No Yes L97.324 Non-pressure chronic ulcer of left ankle with necrosis of bone 09/21/2021 No Yes Inactive Problems Resolved Problems Electronic Signature(s) Signed: 02/11/2022 11:34:01 AM By: Colin Maudlin MD FACS Entered By: [name] on 02/11/2022 11:34:01 -------------------------------------------------------------------------------- Progress Note Details Patient Name: Date of Service: NA [name] A. 02/11/2022 10:45 A M Medical Record Number: [id number] Patient Account Number: [id number] Date of Birth/Sex: Treating RN: Dec 19, 1955 (66 y.o. [name] Primary Care Provider: [name] Other Clinician: Referring Provider: Treating Provider/Extender: [name] Weeks in Treatment: 24 Subjective Chief Complaint Information obtained from Patient Patients presents for treatment of an open diabetic ulcer with exposed bone and osteomyelitis History of Present Illness (HPI) ADMISSION 08/25/2021 This is a 66 year old man who initially presented to his primary care provider in September 2022 with pain in his left foot. He was sent for an x-ray and while the x-ray was being performed, the tech pointed out a wound on his foot that the patient was not aware existed. He does have type 2 diabetes with significant neuropathy. His diabetes is suboptimally controlled with his most recent A1c being 8.5. He also has a history of coronary artery disease status post three- vessel CABG. he was initially seen by orthopedics, but they referred him to [facility]. He has undergone at least 7 operations/debridements and several applications of skin substitute under the care of podiatry. He has been in a wound VAC for much of this time. His most recent procedure was July 28, 2021. A portion of the talus was biopsied and was found to be consistent with osteomyelitis. Culture also returned positive for  corynebacterium. He was seen on August 16, 2021 by infectious disease. A PICC line has been placed and he will be receiving a 6-week course of IV daptomycin and cefepime. In October 2022, he underwent lower extremity vascular studies. Results are copied here: Right: Resting right ankle-brachial index is within normal range. No evidence of significant right lower extremity arterial disease. The right toe-brachial index is abnormal. Left: Resting  left ankle-brachial index indicates mild left lower extremity arterial disease. The left toe-brachial index is abnormal. He has not been seen by vascular surgery despite these findings. He presented to clinic today in a cam boot and is using a knee scooter to offload. Wound VAC was in place. Once this was removed, a large ulcer was identified on the left midfoot/ankle. Bone is frankly exposed. There is no malodorous or purulent drainage. There is some granulation tissue over the central portion of the exposed bone. There is a tunnel that extends posteriorly for roughly 10 cm. It has been discussed with him by multiple providers that he is at very high risk of losing his lower leg because of this wound. He is extremely eager to avoid this outcome and is here today to review his options as well as receive ongoing wound care. 09/03/2021: Here for reevaluation of his wound. There does not appear to have been any substantial improvement overall since our last visit. He has been in a wound VAC with white foam overlying the exposed bone. We are working on getting him approved for hyperbaric oxygen therapy. 09/10/2021: We are in the process of getting him cleared to begin hyperbaric oxygen therapy. He still needs to obtain a chest x-ray. Although the wound measurements are roughly the same, I think the overall appearance of the wound is better. The exposed bone has a bit more granulation tissue covering it. He has not received a vascular surgery appointment to reevaluate  his flow to the wound. 09/17/2021: He has been approved for hyperbaric oxygen therapy and completed his chest x-ray, which I reviewed and it appears normal. The tunnels at the 12 and 10:00 positions are smaller. There is more granulation tissue covering the exposed bone and the undermining has decreased. He still has not received a vascular surgery appointment. 09/24/2021: He initiated hyperbaric oxygen therapy this week and is tolerating it well. He has an appointment with vascular surgery coming up on May 16. The granulation tissue is covering more of the exposed bone and both tunnels are a bit smaller. 10/01/2021: He continues to tolerate hyperbaric oxygen therapy. He saw infectious disease and they are planning to pull his PICC line. He has been initiated on oral antibiotics (doxycycline and Augmentin). The wound looks about the same but the tunnels are a little bit smaller. The skin seems to be contracting somewhat around the exposed bone. 10/08/2021: The wound is still about the same size, but the tunnels continue to come in and the skin is contracting around the exposed bone. He continues to have some accumulation of necrotic material in the inferoposterior aspect of the wound as well as accumulation at the 12:00 tunnel area. 10/15/2021: The wound is smaller today. The tunnels continue to come in. There is less necrotic tissue present. He does have some periwound maceration. 10/22/2021: The wound is about the same size. There is a little bit less undermining at the distal portion. The exposed bone is dark and I am not sure if this is staining from silver nitrate or his VAC sponge or if it represents necrosis. The tunnels are shallower but he does have some serous drainage coming from the 10:00 tunnel. He continues to tolerate hyperbaric oxygen therapy well. 10/29/2021: The undermining continues to improve. The tunnels are about the same. He has good granulation tissue overlying the majority of the  exposed bone. It does appear that perhaps the tubing from his wound VAC has been eroding the skin at the 12 clock  position. He continues to accumulate senescent epithelium around the borders of the wound. 11/05/2021: The undermining is almost completely resolved. The tunnels have contracted fairly significantly. No significant slough or debris accumulation. There is still senescent epithelium accumulation around the borders of the wound. He has been tolerating hyperbaric oxygen therapy well. 11/12/2021: Despite the measurements of the wound being about the same, the wound has changed in its shape and overall, I think it is improved. The undermining has resolved and the tunnels continue to shorten. There is good granulation tissue encroaching over the small area of bone that has remained exposed at the 12 o'clock position. Minimal slough accumulation. He continues to tolerate hyperbaric oxygen therapy well. 11/19/2021: I took a PCR culture last week. There was overgrowth of yeast. He is already taking suppressive doxycycline and Augmentin. I added fluconazole to his regimen. The wound is smaller again today. The tunnels continue to shorten. He continues to do well with hyperbaric oxygen therapy. 11/26/2021: For some reason, his foot has become macerated. The wound is narrower but about the same dimensions in its longitudinal aspect. The tunnels continue to shorten. He has some slough buildup on the wound as well as some heaped up senescent epithelium around the perimeter. 12/03/2021: No further maceration of his foot has occurred. The wound has contracted quite significantly from last week. The tunnel at 10:00 is closed. The tunnel at 12:00 is down to just a couple of millimeters. No other undermining is present. There is soft tissue coverage of the previously exposed bone. There is just a bit of slough and biofilm on the wound surface. 12/10/2021: The wound is looking good. It turns out the tunnel at 12:00 is  only exposed when the patient dorsiflexes his foot. It is about 2 cm in depth when he does this; when his foot is in plantarflexion, the tunnel is closed. The bone that was visible at the 12:00 tunnel is completely covered with granulation tissue, but there does feel like some exposed bone deeper into the tunnel area. There is senescent skin heaped up around the periphery. Minimal slough on the wound surface. 12/16/2021: The wound dimensions are roughly the same. The surface has nice granulation tissue. The exposed bone at the 12:00 tunnel continues to be covered with more soft tissue. 7/14; patient's wound measures smaller today. Using the wound VAC with underlying collagen. He is also being treated with HBO for underlying osteomyelitis. He tells me he is on doxycycline and ampicillin follows with infectious disease next week 12/31/2021: The wound continues to contract. Unfortunately, the area where the track pad and tubing have been rubbing continues to look like it is applying friction. He says that the home health nurses that have been applying the [facility] have been putting gauze underneath the tubing, but nonetheless there is ongoing tissue breakdown at this site. Light slough accumulation on the wound surface. The tunnel continues to contract. He is tolerating HBO without difficulty. 01/07/2022: Bridging the wound VAC away from the ankle has resulted in significant improvement in the tissue at the apex of the wound. The tunnel is still present and is not all that much shorter, but the overall wound surface is very robust and healthy looking. Minimal slough accumulation. No concern for acute infection. 01/21/2022: The wound continues to contract and has a robust granulation tissue surface. The tunnel has come in considerably and is down to about 1.4 cm. There is still bone exposed within the tunnel but the rest of it is well covered.  There is some senescent epithelium at the wound margins and a little bit  of slough on the surface. 01/28/2022: No significant change in the wound this week, but there has not been any reaccumulation of senescent epithelium or slough. The tunnel is perhaps a millimeter less in depth. He has been approved for Apligraf and we will apply this today. 02/04/2022: The wound has contracted somewhat and the tunnel has filled in completely. The wound surface is clean. He is here for Apligraf #2. 02/11/2022: The wound has contracted further and is now nearly flush with the surrounding skin surface. Light layer of slough. Apligraf #3 plan for today. Patient History Information obtained from Patient. Family History Cancer - Father, Diabetes - Father,Mother,Paternal Grandparents, Heart Disease - Father, Hypertension - Father, No family history of Hereditary Spherocytosis, Kidney Disease, Lung Disease, Seizures, Stroke, Thyroid Problems, Tuberculosis. Social History Never smoker, Marital Status - Married, Alcohol Use - Rarely, Drug Use - No History, Caffeine Use - Daily. Medical History Eyes Patient has history of Cataracts - Removed 2008 Cardiovascular Patient has history of Coronary Artery Disease, Hypertension, Myocardial Infarction, Peripheral Arterial Disease Endocrine Patient has history of Type II Diabetes Musculoskeletal Patient has history of Osteomyelitis Neurologic Patient has history of Neuropathy Medical A Surgical History Notes nd Cardiovascular Hypercholesterolemia Abnormal EKG CABG X3 2019 Gastrointestinal GERD Musculoskeletal Diabetic foot ulcer Objective Constitutional Hypertensive, asymptomatic. No acute distress.. Vitals Time Taken: 11:08 AM, Height: 74 in, Weight: 186 lbs, BMI: 23.9, Temperature: 97.3 F, Pulse: 66 bpm, Respiratory Rate: 16 breaths/min, Blood Pressure: 156/87 mmHg, Capillary Blood Glucose: 169 mg/dl. Respiratory Normal work of breathing on room air.. General Notes: 02/11/2022: The wound has contracted further and is now nearly  flush with the surrounding skin surface. Light layer of slough. Integumentary (Hair, Skin) Wound #1 status is Open. Original cause of wound was Gradually Appeared. The date acquired was: 02/22/2021. The wound has been in treatment 24 weeks. The wound is located on the Left,Medial Foot. The wound measures 5.7cm length x 2.4cm width x 0.1cm depth; 10.744cm^2 area and 1.074cm^3 volume. There is Fat Layer (Subcutaneous Tissue) exposed. There is no tunneling or undermining noted. There is a medium amount of serosanguineous drainage noted. The wound margin is distinct with the outline attached to the wound base. There is large (67-100%) red, hyper - granulation within the wound bed. There is a small (1- 33%) amount of necrotic tissue within the wound bed including Adherent Slough. 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 There was a Selective/Open Wound Non-Viable Tissue Debridement with a total area of 13.68 sq cm performed by Colin Maudlin, MD. With the following instrument(s): Curette Material removed includes [facility]. No specimens were taken. A time out was conducted at 11:24, prior to the start of the procedure. A Minimum amount of bleeding was controlled with Pressure. The procedure was tolerated well with a pain level of 0 throughout and a pain level of 0 following the procedure. Post Debridement Measurements: 5.7cm length x 2.4cm width x 0.1cm depth; 1.074cm^3 volume. Character of Wound/Ulcer Post Debridement is  improved. Severity of Tissue Post Debridement is:  Fat layer exposed. Post procedure Diagnosis Wound #1: Same as Pre-Procedure Pre-procedure diagnosis of Wound #1 is a Diabetic Wound/Ulcer of the Lower Extremity located on the Left,Medial Foot. A skin graft procedure using a bioengineered skin substitute/cellular or tissue based product was performed by Colin Maudlin, MD with the following instrument(s): Forceps and Scissors. Apligraf was applied and secured with Steri-Strips. 22 sq cm of product was utilized and 22 sq cm was wasted due to wound size. Post Application, adaptic was applied. A Time Out was conducted at 11:26, prior to the start of the procedure. The procedure was tolerated well with a pain level of 0 throughout and a pain level of 0 following the procedure. Post procedure Diagnosis Wound #1: Same as Pre-Procedure . Plan Follow-up Appointments: Return Appointment in 1 week. - Dr. Celine Ahr - Room 2 - 9/15 at 10:45 AM Cellular or Tissue Based Products: Wound #1 Left,Medial Foot: Cellular or Tissue Based Product Type: - Apligraf #3 Cellular or Tissue Based Product applied to wound bed, secured with steri-strips, cover with Adaptic or Mepitel. (DO NOT REMOVE). Bathing/ Shower/ Hygiene: May shower with protection but do not get wound dressing(s) wet. Negative Presssure Wound Therapy: Black Foam Other: - WOUND VAC CONTINUOUSLY AT 100MM/HG PRESSURE Edema Control - Lymphedema / SCD / Other: Elevate legs to the level of the heart or above for 30 minutes daily and/or when sitting, a frequency of: Hyperbaric Oxygen Therapy: Evaluate for HBO Therapy Indication: - Wagner 3 diabetic ulcer left foot If appropriate for treatment, begin HBOT per protocol: 2.5 ATA for 90 Minutes with 2 Five (5) Minute Air Breaks T Number of Treatments: - 12/31/2021 additional 40 treatments. T of 120 otal otal One treatments per day (delivered Monday through Friday unless otherwise specified in Special  Instructions below): Finger stick Blood Glucose Pre- and Post- HBOT Treatment. Follow Hyperbaric Oxygen Glycemia Protocol Afrin (Oxymetazoline HCL) 0.05% nasal spray - 1 spray in both nostrils daily as needed prior to HBO treatment for difficulty clearing ears WOUND #1: - Foot Wound Laterality: Left, Medial Cleanser: Wound Cleanser 1 x Per Week/30 Days Discharge Instructions: Cleanse the wound with wound cleanser prior to applying a clean dressing using gauze sponges, not tissue or cotton balls. Prim Dressing: Apligraf 1 x Per Week/30 Days ary Discharge Instructions: DO NOT REMOVE Secondary Dressing: Woven Gauze Sponge, Non-Sterile 4x4 in 1 x Per Week/30 Days Discharge Instructions: In clinic. Home Health to apply wound vac. Secured With: [employer], 4.5x3.1 (in/yd) 1 x Per Week/30 Days Discharge Instructions: In clinic. Home Health to apply wound vac. Secured With: 31M Medipore H Soft Cloth Surgical T ape, 4 x 10 (in/yd) 1 x Per Week/30 Days Discharge Instructions: In clinic. Home Health to apply wound vac. 02/11/2022: The wound has contracted further and is now nearly flush with the surrounding skin surface. Light layer of slough. I debrided the slough from the wound surface. I then fenestrated Apligraf #3 and applied it in standard fashion. Approximately 50% of the product was used. The product was secured in place with Adaptic and Steri-Strips. We will continue to use the wound VAC at 100 mmHg suction. Continue hyperbaric oxygen therapy. Follow-up in 1 week. Electronic Signature(s) Signed: 02/16/2022 8:03:25 AM By: Colin Maudlin MD FACS Previous Signature: 02/11/2022 11:37:14 AM Version By: Colin Maudlin MD FACS Entered By: [name] on 02/16/2022 08:03:25 -------------------------------------------------------------------------------- HxROS Details Patient Name: Date of Service: [name], [name] 02/11/2022 10:45 A M Medical Record Number: [id number] Patient Account  Number:  [id number] Date of Birth/Sex: Treating RN: 08/28/1955 (66 y.o. [name] Primary Care Provider: [name] Other Clinician: Referring Provider: Treating Provider/Extender: [name] Weeks in Treatment: 24 Information Obtained From Patient Eyes Medical History: Positive for: Cataracts - Removed 2008 Cardiovascular Medical History: Positive for: Coronary Artery Disease; Hypertension; Myocardial Infarction; Peripheral Arterial Disease Past Medical History Notes: Hypercholesterolemia Abnormal EKG CABG X3 2019 Gastrointestinal Medical History: Past Medical History Notes: GERD Endocrine Medical History: Positive for: Type II Diabetes Time with diabetes: 24 years Treated with: Insulin, Oral agents Blood sugar tested every day: Yes Tested : Musculoskeletal Medical History: Positive for: Osteomyelitis Past Medical History Notes: Diabetic foot ulcer Neurologic Medical History: Positive for: Neuropathy HBO Extended History Items Eyes: Cataracts Immunizations Pneumococcal Vaccine: Received Pneumococcal Vaccination: Yes Received Pneumococcal Vaccination On or After 60th Birthday: Yes Implantable Devices Yes Family and Social History Cancer: Yes - Father; Diabetes: Yes - Father,Mother,Paternal Grandparents; Heart Disease: Yes - Father; Hereditary Spherocytosis: No; Hypertension: Yes - Father; Kidney Disease: No; Lung Disease: No; Seizures: No; Stroke: No; Thyroid Problems: No; Tuberculosis: No; Never smoker; Marital Status - Married; Alcohol Use: Rarely; Drug Use: No History; Caffeine Use: Daily; Financial Concerns: No; Food, Clothing or Shelter Needs: No; Support System Lacking: No; Transportation Concerns: No Electronic Signature(s) Signed: 02/11/2022 11:38:22 AM By: Colin Maudlin MD FACS Signed: 02/11/2022 4:42:04 PM By: [name] Entered By: [name] on 02/11/2022  11:35:00 -------------------------------------------------------------------------------- SuperBill Details Patient Name: Date of Service: NA [name] A. 02/11/2022 Medical Record Number: [id number] Patient Account Number: [id number] Date of Birth/Sex: Treating RN: 1955/10/25 (66 y.o. 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 Facility Procedures CPT4 Code: [id number] I Description: (Facility Use Only) Apligraf 1 SQ CM 275 - SKIN SUB GRAFT FACE/NK/HF/G CD-10 Diagnosis Description L97.324 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 Modifier: 1 Quantity: 44 Physician Procedures : CPT4 Code Description Modifier [id number] - WC PHYS LEVEL 3 - EST PT 25 ICD-10 Diagnosis Description L97.324 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 E11.621 Type 2 diabetes mellitus with foot ulcer M86.672 Other chronic  osteomyelitis, left ankle and foot E11.610 Type 2 diabetes mellitus with diabetic neuropathic arthropathy Quantity: 1 : [id number] - WC PHYS SKIN SUB GRAFT FACE/NK/HF/G ICD-10 Diagnosis Description L97.324 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

## 2022-02-11 NOTE — Progress Notes (Signed)
[name], [name] ([id number]) Visit Report for 02/11/2022 Problem List Details Patient Name: Date of Service: NA [name] 02/11/2022 8:00 A M Medical Record Number: [id number] Patient Account Number: [id number] Date of Birth/Sex: Treating RN: Aug 06, 1955 (66 y.o. 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 02/11/2022 Medical Record Number: [id number] Patient Account Number: [id number] Date of Birth/Sex: Treating RN: 1956/01/26 (66 y.o. 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 Facility Procedures CPT4 Code: [id number] Description: G0277-(Facility Use Only) HBOT full body chamber, , ICD-10 Diagnosis Description E11.621 Type 2 diabetes mellitus with foot ulcer L97.324 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 M86.672 Other chronic  osteomyelitis, left ankle and foot E11.610 Type 2 diabetes mellitus with diabetic neuropathic arthropathy Modifier: Quantity: 4 Physician Procedures : CPT4 Code Description Modifier [id number] - WC PHYS HYPERBARIC OXYGEN THERAPY ICD-10 Diagnosis Description E11.621 Type 2 diabetes mellitus with foot ulcer L97.324 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

## 2022-02-11 NOTE — Progress Notes (Addendum)
[name], [name] ([id number]) Visit Report for 02/11/2022 HBO Details Patient Name: Date of Service: NA Colin Nephew A. 02/11/2022 8:00 A M Medical Record Number: [id number] Patient Account Number: [id number] Date of Birth/Sex: Treating RN: 08-05-1955 (66 y.o. [name] Primary Care Asyah Candler: [name] Other Clinician: [name] Referring [name]: Treating [name]/Extender: [name]: 24 HBO Treatment Course Details Treatment Course Number: 1 Ordering Jendaya Gossett: [name] T Treatments Ordered: otal 120 HBO Treatment Start Date: 09/20/2021 HBO Indication: Diabetic Ulcer(s) of the Lower Extremity Standard/Conservative Wound Care tried and failed greater than or equal to 30 days HBO Treatment Details Treatment Number: 94 Patient Type: Outpatient Chamber Type: Monoplace Chamber Serial #: [id number] Treatment Protocol: 2.0 ATA with 90 minutes oxygen, with two 5 minute air breaks Treatment Details Compression Rate Down: 2.0 psi / minute De-Compression Rate Up: 2.0 psi / minute A breaks and breathing ir Compress Tx Pressure periods Decompress Decompress Begins Reached (leave unused spaces Begins Ends blank) Chamber Pressure (ATA 1 2 2 2 2 2  --2 1 ) Clock Time (24 hr) 08:32 08:44 09:15 09:20 09:50 09:55 - - 10:25 10:34 Treatment Length: 122 (minutes) Treatment Segments: 4 Vital Signs Capillary Blood Glucose Reference Range: 80 - 120 mg / dl HBO Diabetic Blood Glucose Intervention Range: <131 mg/dl or mg/dl Time Vitals Blood Respiratory Capillary Blood Glucose Pulse Action Type: Pulse: Temperature: Taken: Pressure: Rate: Glucose (mg/dl): Meter #: Oximetry (%) Taken: Pre 08:30 148/93 79 16 98.8 134 Post 10:37 156/87 66 16 97.3 169 Treatment Response Treatment Toleration: Well Treatment Completion Status: Treatment Completed without Adverse Event Treatment Notes The patient had no problems clearing today. The  patient had a Wound Care Clinic visit after his treatment today. Additional Procedure Documentation Tissue Sevierity: Necrosis of bone Physician HBO Attestation: I certify that I supervised this HBO treatment in accordance with Medicare guidelines. A trained emergency response team is readily available per Yes hospital policies and procedures. Continue HBOT as ordered. Yes Electronic Signature(s) Signed: 02/11/2022 1:39:42 PM By: 04/13/2022 MD FACS Previous Signature: 02/11/2022 11:59:38 AM Version By: 04/13/2022 EMT Entered By: [name] on 02/11/2022 13:39:42 -------------------------------------------------------------------------------- HBO Safety Checklist Details Patient Name: Date of Service: NA 04/13/2022 [name] 02/11/2022 8:00 A M Medical Record Number: 04/13/2022 Patient Account Number: [id number] Date of Birth/Sex: Treating RN: 24-Feb-1956 (66 y.o. 79 Primary Care Stephanie Mcglone: [name] Other Clinician: [name] Referring Mikah Rottinghaus: Treating Yoshiko Keleher/Extender: [name]: 24 HBO Safety Checklist Items Safety Checklist Consent Form Signed Patient voided / foley secured and emptied When did you last eato 0611 Last dose of injectable or oral agent 0610 Ostomy pouch emptied and vented if applicable NA All implantable devices assessed, documented and approved NA Intravenous access site secured and place NA Valuables secured Linens and cotton and cotton/polyester blend (less than 51% polyester) Personal oil-based products / skin lotions / body lotions removed Wigs or hairpieces removed NA Smoking or tobacco materials removed Books / newspapers / magazines / loose paper removed Cologne, aftershave, perfume and deodorant removed Jewelry removed (may wrap wedding band) NA Make-up removed NA Hair care products removed Battery operated devices (external) removed Heating patches and chemical warmers  removed Titanium eyewear removed NA Nail polish cured greater than 10 hours NA Casting material cured greater than 10 hours NA Hearing aids removed NA Loose dentures or partials removed NA Prosthetics have been removed NA Patient demonstrates correct use of air break  device (if applicable) Patient concerns have been addressed Patient grounding bracelet on and cord attached to chamber Specifics for Inpatients (complete in addition to above) Medication sheet sent with patient NA Intravenous medications needed or due during therapy sent with patient NA Drainage tubes (e.g. nasogastric tube or chest tube secured and vented) NA Endotracheal or Tracheotomy tube secured NA Cuff deflated of air and inflated with saline NA Airway suctioned NA Notes The safety checklist was done before the treatment was started Electronic Signature(s) Signed: 02/11/2022 11:56:12 AM By: [name] EMT Entered By: [name] on 02/11/2022 11:56:11

## 2022-02-11 NOTE — Progress Notes (Addendum)
[name], [name] ([id number]) Visit Report for 02/11/2022 Arrival Information Details Patient Name: Date of Service: NA [name] 02/11/2022 8:00 A M Medical Record Number: [id number] Patient Account Number: [id number] Date of Birth/Sex: Treating RN: [birth year] (66 y.o. [name] Primary Care Kenaz Olafson: [name] Other Clinician: [name] Referring [name]: Treating [name] in Treatment: 24 Visit Information History Since Last Visit All ordered tests and consults were completed: Yes Patient Arrived: Knee Scooter Added or deleted any medications: No Arrival Time: 08:10 Any new allergies or adverse reactions: No Accompanied By: Wife Had a fall or experienced change in No Transfer Assistance: None activities of daily living that may affect Patient Identification Verified: Yes risk of falls: Secondary Verification Process Completed: Yes Signs or symptoms of abuse/neglect since last visito No Patient Requires Transmission-Based Precautions: No Hospitalized since last visit: No Patient Has Alerts: Yes Implantable device outside of the clinic excluding No Patient Alerts: Patient on Blood Thinner cellular tissue based products placed in the center since last visit: Pain Present Now: No Electronic Signature(s) Signed: 02/11/2022 11:54:17 AM By: [name] EMT Entered By: [name] on 02/11/2022 11:54:16 -------------------------------------------------------------------------------- Encounter Discharge Information Details Patient Name: Date of Service: [name] 02/11/2022 8:00 A M Medical Record Number: [id number] Patient Account Number: [id number] Date of Birth/Sex: Treating RN: [birth year] (66 y.o. [name] Primary Care Darrell Leonhardt: [name] Other Clinician: [name] Referring Ayaana Biondo: Treating Madinah Quarry/Extender: [name] in Treatment: 24 Encounter Discharge  Information Items Discharge Condition: Stable Ambulatory Status: Knee Scooter Discharge Destination: Other (Note Required) Transportation: Private Auto Accompanied By: Wife Schedule Follow-up Appointment: Yes Clinical Summary of Care: Notes The patient had a Wound Care Clinic visit after his treatment today. Electronic Signature(s) Signed: 02/11/2022 12:02:15 PM By: [name] EMT Entered By: [name] on 02/11/2022 12:02:14 -------------------------------------------------------------------------------- Vitals Details Patient Name: Date of Service: NA RRO Colin Carnes, [name] 02/11/2022 8:00 A M Medical Record Number: [id number] Patient Account Number: [id number] Date of Birth/Sex: Treating RN: 21-Jan-1956 (66 y.o. [name] Primary Care Tarnisha Kachmar: [name] Other Clinician: [name] Referring [name]: Treating [name] Vital Signs Time Taken: 08:30 Temperature (F): 98.8 Height (in): 74 Pulse (bpm): 79 Weight (lbs): 186 Respiratory Rate (breaths/min): 16 Body Mass Index (BMI): 23.9 Blood Pressure (mmHg): 148/93 Capillary Blood Glucose (mg/dl): 675 Reference Range: 80 - 120 mg / dl Electronic Signature(s) Signed: 02/11/2022 11:55:13 AM By: [name] EMT Entered By: [name] on 02/11/2022 11:55:13

## 2022-02-12 DIAGNOSIS — T8131XA Disruption of external operation (surgical) wound, not elsewhere classified, initial encounter: Secondary | ICD-10-CM | POA: Diagnosis not present

## 2022-02-13 DIAGNOSIS — T8131XA Disruption of external operation (surgical) wound, not elsewhere classified, initial encounter: Secondary | ICD-10-CM | POA: Diagnosis not present

## 2022-02-14 DIAGNOSIS — T8131XA Disruption of external operation (surgical) wound, not elsewhere classified, initial encounter: Secondary | ICD-10-CM | POA: Diagnosis not present

## 2022-02-15 ENCOUNTER — Encounter (HOSPITAL_BASED_OUTPATIENT_CLINIC_OR_DEPARTMENT_OTHER): Payer: BC Managed Care – PPO | Admitting: General Surgery

## 2022-02-15 DIAGNOSIS — E11621 Type 2 diabetes mellitus with foot ulcer: Secondary | ICD-10-CM | POA: Diagnosis not present

## 2022-02-15 DIAGNOSIS — M86672 Other chronic osteomyelitis, left ankle and foot: Secondary | ICD-10-CM | POA: Diagnosis not present

## 2022-02-15 DIAGNOSIS — T8131XA Disruption of external operation (surgical) wound, not elsewhere classified, initial encounter: Secondary | ICD-10-CM | POA: Diagnosis not present

## 2022-02-15 DIAGNOSIS — L97324 Non-pressure chronic ulcer of left ankle with necrosis of bone: Secondary | ICD-10-CM | POA: Diagnosis not present

## 2022-02-15 DIAGNOSIS — I2510 Atherosclerotic heart disease of native coronary artery without angina pectoris: Secondary | ICD-10-CM | POA: Diagnosis not present

## 2022-02-15 DIAGNOSIS — E11610 Type 2 diabetes mellitus with diabetic neuropathic arthropathy: Secondary | ICD-10-CM | POA: Diagnosis not present

## 2022-02-15 LAB — GLUCOSE, CAPILLARY
Glucose-Capillary: 190 mg/dL — ABNORMAL HIGH (ref 70–99)
Glucose-Capillary: 168 mg/dL — ABNORMAL HIGH (ref 70–99)

## 2022-02-15 NOTE — Progress Notes (Addendum)
HABIB, KISE ([id number]) Visit Report for 02/15/2022 Problem List Details Patient Name: Date of Service: NA [name] 02/15/2022 8:00 A M Medical Record Number: [id number] Patient Account Number: [id number] Date of Birth/Sex: Treating RN: 1956-02-27 (66 y.o. 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 02/15/2022 Medical Record Number: [id number] Patient Account Number: [id number] Date of Birth/Sex: Treating RN: 1956-01-06 (66 y.o. 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 Facility Procedures CPT4 Code: 28208138 Description: G0277-(Facility Use Only) HBOT full body chamber, , ICD-10 Diagnosis Description E11.621 Type 2 diabetes mellitus with foot ulcer 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 M86.672 Other chronic  osteomyelitis, left ankle and foot 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 Modifier: Quantity: 4 Physician Procedures : CPT4 Code Description Modifier [id number] - WC PHYS HYPERBARIC OXYGEN THERAPY ICD-10 Diagnosis Description E11.621 Type 2 diabetes mellitus with foot ulcer 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

## 2022-02-15 NOTE — Progress Notes (Addendum)
[name], [name] ([id number]) Visit Report for 02/15/2022 HBO Details Patient Name: Date of Service: NA Colin Nephew A. 02/15/2022 8:00 A M Medical Record Number: [id number] Patient Account Number: [id number] Date of Birth/Sex: Treating RN: 05-12-1956 (66 y.o. [name], [name] Primary Care [name]: [name] Other Clinician: [name] Referring [name]: Treating [name]/Extender: [name] Weeks in Treatment: 24 HBO Treatment Course Details Treatment Course Number: 1 Ordering [name]: [name] T Treatments Ordered: otal 120 HBO Treatment Start Date: 09/20/2021 HBO Indication: Diabetic Ulcer(s) of the Lower Extremity Standard/Conservative Wound Care tried and failed greater than or equal to 30 days HBO Treatment Details Treatment Number: 95 Patient Type: Outpatient Chamber Type: Monoplace Chamber Serial #: [id number] Treatment Protocol: 2.0 ATA with 90 minutes oxygen, with two 5 minute air breaks Treatment Details Compression Rate Down: 2.0 psi / minute De-Compression Rate Up: 2.0 psi / minute A breaks and breathing ir Compress Tx Pressure periods Decompress Decompress Begins Reached (leave unused spaces Begins Ends blank) Chamber Pressure (ATA 1 2 2 2 2 2  --2 1 ) Clock Time (24 hr) 08:43 08:54 09:24 09:29 09:59 10:04 - - 10:34 10:45 Treatment Length: 122 (minutes) Treatment Segments: 4 Vital Signs Capillary Blood Glucose Reference Range: 80 - 120 mg / dl HBO Diabetic Blood Glucose Intervention Range: <131 mg/dl or mg/dl Time Vitals Blood Respiratory Capillary Blood Glucose Pulse Action Type: Pulse: Temperature: Taken: Pressure: Rate: Glucose (mg/dl): Meter #: Oximetry (%) Taken: Pre 08:37 146/86 80 16 98.1 190 Post 10:46 154/96 72 16 97.3 168 Treatment Response Treatment Toleration: Well Treatment Completion Status: Treatment Completed without Adverse Event Additional Procedure Documentation Tissue Sevierity: Necrosis of  bone Physician HBO Attestation: I certify that I supervised this HBO treatment in accordance with Medicare guidelines. A trained emergency response team is readily available per Yes hospital policies and procedures. Continue HBOT as ordered. Yes Electronic Signature(s) Signed: 02/15/2022 4:12:35 PM By: 04/17/2022 MD FACS Previous Signature: 02/15/2022 4:00:04 PM Version By: 04/17/2022 EMT Entered By: [name] on 02/15/2022 16:12:34 -------------------------------------------------------------------------------- HBO Safety Checklist Details Patient Name: Date of Service: NA 04/17/2022 A. 02/15/2022 8:00 A M Medical Record Number: 04/17/2022 Patient Account Number: [id number] Date of Birth/Sex: Treating RN: 1956-06-08 (66 y.o. 79, [name] Primary Care [name]: [name] Other Clinician: [name] Referring [name]: Treating [name]/Extender: [name] Weeks in Treatment: 24 HBO Safety Checklist Items Safety Checklist Consent Form Signed Patient voided / foley secured and emptied When did you last eato 0630 Last dose of injectable or oral agent 0630 Ostomy pouch emptied and vented if applicable NA All implantable devices assessed, documented and approved NA Intravenous access site secured and place NA Valuables secured Linens and cotton and cotton/polyester blend (less than 51% polyester) Personal oil-based products / skin lotions / body lotions removed Wigs or hairpieces removed NA Smoking or tobacco materials removed Books / newspapers / magazines / loose paper removed Cologne, aftershave, perfume and deodorant removed Jewelry removed (may wrap wedding band) NA Make-up removed NA Hair care products removed Battery operated devices (external) removed Heating patches and chemical warmers removed Titanium eyewear removed NA Nail polish cured greater than 10 hours NA Casting material cured greater than 10  hours NA Hearing aids removed NA Loose dentures or partials removed NA Prosthetics have been removed NA Patient demonstrates correct use of air break device (if applicable) Patient concerns have been addressed Patient grounding bracelet on and cord attached to chamber Specifics for Inpatients (complete  in addition to above) Medication sheet sent with patient NA Intravenous medications needed or due during therapy sent with patient NA Drainage tubes (e.g. nasogastric tube or chest tube secured and vented) NA Endotracheal or Tracheotomy tube secured NA Cuff deflated of air and inflated with saline NA Airway suctioned NA Notes The safety checklist was done before the treatment was started Electronic Signature(s) Signed: 02/15/2022 3:57:35 PM By: [name] EMT Entered By: [name] on 02/15/2022 15:57:35

## 2022-02-15 NOTE — Progress Notes (Signed)
[name], [name] ([id number]) Visit Report for 02/15/2022 Arrival Information Details Patient Name: Date of Service: NA [name] 02/15/2022 8:00 A M Medical Record Number: [id number] Patient Account Number: [id number] Date of Birth/Sex: Treating RN: 04-12-1956 (66 y.o. [name], [name] Primary Care Ellwyn Ergle: [name] Other Clinician: [name] Referring Orvell Careaga: Treating Malikhi Ogan/Extender: [name] in Treatment: 24 Visit Information History Since Last Visit All ordered tests and consults were completed: Yes Patient Arrived: Knee Scooter Added or deleted any medications: No Arrival Time: 08:23 Any new allergies or adverse reactions: No Accompanied By: Wife Had a fall or experienced change in No Transfer Assistance: None activities of daily living that may affect Patient Identification Verified: Yes risk of falls: Secondary Verification Process Completed: Yes Signs or symptoms of abuse/neglect since last visito No Patient Requires Transmission-Based Precautions: No Hospitalized since last visit: No Patient Has Alerts: Yes Implantable device outside of the clinic excluding No Patient Alerts: Patient on Blood Thinner cellular tissue based products placed in the center since last visit: Pain Present Now: No Electronic Signature(s) Signed: 02/15/2022 3:56:04 PM By: [name] EMT Entered By: [name] on 02/15/2022 15:56:04 -------------------------------------------------------------------------------- Encounter Discharge Information Details Patient Name: Date of Service: NA Colin Colin Halsted A. 02/15/2022 8:00 A M Medical Record Number: [id number] Patient Account Number: [id number] Date of Birth/Sex: Treating RN: 23-Jun-1955 (66 y.o. [name] Primary Care Shann Lewellyn: [name] Other Clinician: [name] Referring Brooks Kinnan: Treating Salimata Christenson/Extender: [name]: 24 Encounter Discharge  Information Items Discharge Condition: Stable Ambulatory Status: Knee Scooter Discharge Destination: Home Transportation: Other Accompanied By: Wife Schedule Follow-up Appointment: Yes Clinical Summary of Care: Electronic Signature(s) Signed: 02/15/2022 4:01:56 PM By: [name] EMT Entered By: [name] on 02/15/2022 16:01:56 -------------------------------------------------------------------------------- Vitals Details Patient Name: Date of Service: NA Colin [name], [name] 02/15/2022 8:00 A M Medical Record Number: [id number] Patient Account Number: [id number] Date of Birth/Sex: Treating RN: 11-Aug-1955 (66 y.o. [name], [name] Primary Care Lilias Lorensen: [name] Other Clinician: [name] Referring Verne Lanuza: Treating Wylma Tatem/Extender: [name]: 24 Vital Signs Time Taken: 08:37 Temperature (F): 98.1 Height (in): 74 Pulse (bpm): 80 Weight (lbs): 186 Respiratory Rate (breaths/min): 16 Body Mass Index (BMI): 23.9 Blood Pressure (mmHg): 146/86 Capillary Blood Glucose (mg/dl): 656 Reference Range: 80 - 120 mg / dl Electronic Signature(s) Signed: 02/15/2022 3:56:36 PM By: [name] EMT Entered By: [name] on 02/15/2022 15:56:36

## 2022-02-15 NOTE — Progress Notes (Signed)
DRAYVIN, MAGGI ([id number]) Visit Report for 02/11/2022 Arrival Information Details Patient Name: Date of Service: NA [name] 02/11/2022 10:45 A M Medical Record Number: [id number] Patient Account Number: [id number] Date of Birth/Sex: Treating RN: [birth year] (66 y.o. [name] Primary Care Pavlos Yon: [name] Other Clinician: Referring Yvaine Jankowiak: Treating Yulian Gosney/Extender: [name] in Treatment: 24 Visit Information History Since Last Visit Added or deleted any medications: No Patient Arrived: Knee Scooter Any new allergies or adverse reactions: No Arrival Time: 11:08 Had a fall or experienced change in No Accompanied By: self activities of daily living that may affect Transfer Assistance: None risk of falls: Patient Identification Verified: Yes Signs or symptoms of abuse/neglect since last visito No Secondary Verification Process Completed: Yes Hospitalized since last visit: No Patient Requires Transmission-Based Precautions: No Implantable device outside of the clinic excluding No Patient Has Alerts: Yes cellular tissue based products placed in the center Patient Alerts: Patient on Blood Thinner since last visit: Has Dressing in Place as Prescribed: Yes Pain Present Now: No Electronic Signature(s) Signed: 02/15/2022 12:19:15 PM By: Blanche East RN Entered By: Blanche East on 02/11/2022 11:08:35 -------------------------------------------------------------------------------- Encounter Discharge Information Details Patient Name: Date of Service: [name] 02/11/2022 10:45 A M Medical Record Number: [id number] Patient Account Number: [id number] Date of Birth/Sex: Treating RN: 15-Jun-1955 (65 y.o. [name] Primary Care Llewellyn Choplin: [name] Other Clinician: Referring Roy Snuffer: Treating Wendell Nicoson/Extender: [name] in Treatment: 24 Encounter Discharge Information Items Post Procedure  Vitals Discharge Condition: Stable Temperature (F): 97.3 Ambulatory Status: Knee Scooter Pulse (bpm): 66 Discharge Destination: Home Respiratory Rate (breaths/min): 16 Transportation: Private Auto Blood Pressure (mmHg): 156/87 Accompanied By: self Schedule Follow-up Appointment: Yes Clinical Summary of Care: Patient Declined Electronic Signature(s) Signed: 02/11/2022 4:42:04 PM By: Adline Peals Entered By: Adline Peals on 02/11/2022 13:25:00 -------------------------------------------------------------------------------- Lower Extremity Assessment Details Patient Name: Date of Service: Colin Loveless A. 02/11/2022 10:45 A M Medical Record Number: [id number] Patient Account Number: [id number] Date of Birth/Sex: Treating RN: December 04, 1955 (66 y.o. [name] Primary Care Crespin Forstrom: [name] Other Clinician: Referring Vincentina Sollers: Treating Ander Wamser/Extender: [name] Weeks in Treatment: 24 Edema Assessment Assessed: Shirlyn Goltz: No] [Right: No] Edema: [Left: Ye] [Right: s] Calf Left: Right: Point of Measurement: From Medial Instep 39.1 cm Ankle Left: Right: Point of Measurement: From Medial Instep 22.3 cm Vascular Assessment Pulses: Dorsalis Pedis Palpable: [Left:Yes] Electronic Signature(s) Signed: 02/11/2022 4:42:04 PM By: Adline Peals Entered By: Adline Peals on 02/11/2022 11:17:57 -------------------------------------------------------------------------------- Multi Wound Chart Details Patient Name: Date of Service: [name] 02/11/2022 10:45 A M Medical Record Number: [id number] Patient Account Number: [id number] Date of Birth/Sex: Treating RN: 1955-11-16 (66 y.o. [name] Primary Care Latayvia Mandujano: [name] Other Clinician: Referring Irelynd Zumstein: Treating Chastidy Ranker/Extender: [name] Weeks in Treatment: 24 Vital Signs Height(in): 74 Capillary Blood Glucose(mg/dl): 169 Weight(lbs):  186 Pulse(bpm): 79 Body Mass Index(BMI): 23.9 Blood Pressure(mmHg): 156/87 Temperature(F): 97.3 Respiratory Rate(breaths/min): 16 Photos: [1:Left, Medial Foot] [N/A:N/A N/A] Wound Location: [1:Gradually Appeared] [N/A:N/A] Wounding Event: [1:Diabetic Wound/Ulcer of the Lower] [N/A:N/A] Primary Etiology: [1:Extremity Cataracts, Coronary Artery Disease, N/A] Comorbid History: [1:Hypertension, Myocardial Infarction, Peripheral Arterial Disease, Type II Diabetes, Osteomyelitis, Neuropathy 02/22/2021] [N/A:N/A] Date Acquired: [1:24] [N/A:N/A] Weeks of Treatment: [1:Open] [N/A:N/A] Wound Status: [1:No] [N/A:N/A] Wound Recurrence: [1:5.7x2.4x0.1] [N/A:N/A] Measurements L x W x D (cm) [1:10.744] [N/A:N/A] A (cm) : rea [1:1.074] [N/A:N/A] Volume (cm) : [1:24.00%] [N/A:N/A] % Reduction  in A [1:rea: 92.40%] [N/A:N/A] % Reduction in Volume: [1:Grade 3] [N/A:N/A] Classification: [1:Medium] [N/A:N/A] Exudate A mount: [1:Serosanguineous] [N/A:N/A] Exudate Type: [1:red, brown] [N/A:N/A] Exudate Color: [1:Distinct, outline attached] [N/A:N/A] Wound Margin: [1:Large (67-100%)] [N/A:N/A] Granulation A mount: [1:Red, Hyper-granulation] [N/A:N/A] Granulation Quality: [1:Small (1-33%)] [N/A:N/A] Necrotic A mount: [1:Fat Layer (Subcutaneous Tissue): Yes N/A] Exposed Structures: [1:Fascia: No Tendon: No Muscle: No Joint: No Bone: No Medium (34-66%)] [N/A:N/A] Epithelialization: [1:Debridement - Selective/Open Wound N/A] Debridement: Pre-procedure Verification/Time Out 11:24 [N/A:N/A] Taken: [1:Slough] [N/A:N/A] Tissue Debrided: [1:Non-Viable Tissue] [N/A:N/A] Level: [1:13.68] [N/A:N/A] Debridement A (sq cm): [1:rea Curette] [N/A:N/A] Instrument: [1:Minimum] [N/A:N/A] Bleeding: [1:Pressure] [N/A:N/A] Hemostasis A chieved: [1:0] [N/A:N/A] Procedural Pain: [1:0] [N/A:N/A] Post Procedural Pain: [1:Procedure was tolerated well] [N/A:N/A] Debridement Treatment Response: [1:5.7x2.4x0.1]  [N/A:N/A] Post Debridement Measurements L x W x D (cm) [1:1.074] [N/A:N/A] Post Debridement Volume: (cm) [1:Debridement] [N/A:N/A] Treatment Notes Electronic Signature(s) Signed: 02/11/2022 11:34:09 AM By: [name] MD FACS Signed: 02/11/2022 4:42:04 PM By: Samuella Bruin Entered By: [name] on 02/11/2022 11:34:09 -------------------------------------------------------------------------------- Multi-Disciplinary Care Plan Details Patient Name: Date of Service: NA Colin Nephew A. 02/11/2022 10:45 A M Medical Record Number: [id number] Patient Account Number: [id number] Date of Birth/Sex: Treating RN: Oct 15, 1955 (66 y.o. [name] Primary Care Oluwanifemi Susman: [name] Other Clinician: Referring Thao Vanover: Treating Estefania Kamiya/Extender: [name] in Treatment: 24 Multidisciplinary Care Plan reviewed with physician Active Inactive HBO Nursing Diagnoses: Anxiety related to feelings of confinement associated with the hyperbaric oxygen chamber Anxiety related to knowledge deficit of hyperbaric oxygen therapy and treatment procedures Discomfort related to temperature and humidity changes inside hyperbaric chamber Potential for barotraumas to ears, sinuses, teeth, and lungs or cerebral gas embolism related to changes in atmospheric pressure inside hyperbaric oxygen chamber Potential for oxygen toxicity seizures related to delivery of 100% oxygen at an increased atmospheric pressure Potential for pulmonary oxygen toxicity related to delivery of 100% oxygen at an increased atmospheric pressure Goals: Barotrauma will be prevented during HBO2 Date Initiated: 09/17/2021 T arget Resolution Date: 03/11/2022 Goal Status: Active Patient will tolerate the hyperbaric oxygen therapy treatment Date Initiated: 09/17/2021 T arget Resolution Date: 03/11/2022 Goal Status: Active Patient will tolerate the internal climate of the chamber Date Initiated:  09/17/2021 T arget Resolution Date: 03/11/2022 Goal Status: Active Patient/caregiver will verbalize understanding of HBO goals, rationale, procedures and potential hazards Date Initiated: 09/17/2021 T arget Resolution Date: 03/11/2022 Goal Status: Active Signs and symptoms of pulmonary oxygen toxicity will be recognized and promptly addressed Date Initiated: 09/17/2021 T arget Resolution Date: 03/11/2022 Goal Status: Active Signs and symptoms of seizure will be recognized and promptly addressed ; seizing patients will suffer no harm Date Initiated: 09/17/2021 T arget Resolution Date: 03/11/2022 Goal Status: Active Interventions: Administer decongestants, per physician orders, prior to HBO2 Administer the correct therapeutic gas delivery based on the patients needs and limitations, per physician order Assess and provide for patients comfort related to the hyperbaric environment and equalization of middle ear Assess for signs and symptoms related to adverse events, including but not limited to confinement anxiety, pneumothorax, oxygen toxicity and baurotrauma Assess patient for any history of confinement anxiety Assess patient's knowledge and expectations regarding hyperbaric medicine and provide education related to the hyperbaric environment, goals of treatment and prevention of adverse events Implement protocols to decrease risk of pneumothorax in high risk patients Notes: Nutrition Nursing Diagnoses: Impaired glucose control: actual or potential Goals: Patient/caregiver verbalizes understanding of need to maintain therapeutic glucose control per primary care physician Date Initiated: 08/25/2021 Target  Resolution Date: 03/11/2022 Goal Status: Active Interventions: Assess HgA1c results as ordered upon admission and as needed Provide education on elevated blood sugars and impact on wound healing Notes: Osteomyelitis Nursing Diagnoses: Infection: osteomyelitis Knowledge deficit related to  disease process and management Goals: Patient's osteomyelitis will resolve Date Initiated: 09/17/2021 Target Resolution Date: 03/11/2022 Goal Status: Active Interventions: Assess for signs and symptoms of osteomyelitis resolution every visit Provide education on osteomyelitis Treatment Activities: Surgical debridement : 09/17/2021 Systemic antibiotics : 09/17/2021 T ordered outside of clinic : 09/17/2021 est Notes: Electronic Signature(s) Signed: 02/11/2022 4:42:04 PM By: Samuella Bruin Entered By: Samuella Bruin on 02/11/2022 11:18:45 -------------------------------------------------------------------------------- Negative Pressure Wound Therapy Maintenance (NPWT) Details Patient Name: Date of Service: Colin Fanning A. 02/11/2022 10:45 A M Medical Record Number: [id number] Patient Account Number: [id number] Date of Birth/Sex: Treating RN: [birth year] (66 y.o. [name] Primary Care Adison Reifsteck: [name] Other Clinician: Referring Malkia Nippert: Treating Nansi Birmingham/Extender: [name]: 24 NPWT Maintenance Performed for: Wound #1 Left, Medial Foot Performed By: Samuella Bruin, RN Type: VAC System Coverage Size (sq cm): 13.68 Pressure Type: Constant Pressure Setting: 100 mmHG Drain Type: None Sponge/Dressing Type: Foam, Black Date Initiated: 03/19/2021 Dressing Removed: No Quantity of Sponges/Gauze Removed: 1 Canister Changed: Yes Dressing Reapplied: Yes Quantity of Sponges/Gauze Inserted: 1 Days On NPWT : 330 Post Procedure Diagnosis Same as Pre-procedure Electronic Signature(s) Signed: 02/15/2022 5:49:01 PM By: Samuella Bruin Entered By: Samuella Bruin on 02/15/2022 07:32:49 -------------------------------------------------------------------------------- Pain Assessment Details Patient Name: Date of Service: NA Colin Nephew A. 02/11/2022 10:45 A M Medical Record Number: [id number] Patient Account Number:  [id number] Date of Birth/Sex: Treating RN: [birth year] (66 y.o. [name] Primary Care Broly Hatfield: [name] Other Clinician: Referring Najwa Spillane: Treating Jaree Trinka/Extender: [name]: 24 Active Problems Location of Pain Severity and Description of Pain Patient Has Paino No Site Locations Pain Management and Medication Current Pain Management: Electronic Signature(s) Signed: 02/15/2022 12:19:15 PM By: Tommie Ard RN Entered By: Tommie Ard on 02/11/2022 11:10:04 -------------------------------------------------------------------------------- Patient/Caregiver Education Details Patient Name: Date of Service: NA [name] 9/1/2023andnbsp10:45 A M Medical Record Number: [id number] Patient Account Number: [id number] Date of Birth/Gender: Treating RN: Jan 10, 1956 (66 y.o. [name] Primary Care Physician: [name] Other Clinician: Referring Physician: Treating Physician/Extender: [name] in Treatment: 24 Education Assessment Education Provided To: Patient Education Topics Provided Wound/Skin Impairment: Methods: Explain/Verbal Responses: Reinforcements needed, State content correctly Electronic Signature(s) Signed: 02/11/2022 4:42:04 PM By: Samuella Bruin Entered By: Samuella Bruin on 02/11/2022 11:18:56 -------------------------------------------------------------------------------- Wound Assessment Details Patient Name: Date of Service: Colin Fanning A. 02/11/2022 10:45 A M Medical Record Number: [id number] Patient Account Number: [id number] Date of Birth/Sex: Treating RN: 29-Dec-1955 (66 y.o. [name] Primary Care Kaliope Quinonez: [name] Other Clinician: Referring Panfilo Ketchum: Treating Tejon Gracie/Extender: [name]: 24 Wound Status Wound Number: 1 Primary Diabetic Wound/Ulcer of the Lower Extremity Etiology: Wound Location:  Left, Medial Foot Wound Open Wounding Event: Gradually Appeared Status: Date Acquired: 02/22/2021 Comorbid Cataracts, Coronary Artery Disease, Hypertension, Myocardial Weeks Of Treatment: 24 History: Infarction, Peripheral Arterial Disease, Type II Diabetes, Clustered Wound: No Osteomyelitis, Neuropathy Photos Wound Measurements Length: (cm) 5.7 Width: (cm) 2.4 Depth: (cm) 0.1 Area: (cm) 10.744 Volume: (cm) 1.074 % Reduction in Area: 24% % Reduction in Volume: 92.4% Epithelialization: Medium (34-66%) Tunneling: No Undermining: No Wound Description Classification: Grade 3 Wound Margin: Distinct, outline attached Exudate Amount:  Medium Exudate Type: Serosanguineous Exudate Color: red, brown Foul Odor After Cleansing: No Slough/Fibrino Yes Wound Bed Granulation Amount: Large (67-100%) Exposed Structure Granulation Quality: Red, Hyper-granulation Fascia Exposed: No Necrotic Amount: Small (1-33%) Fat Layer (Subcutaneous Tissue) Exposed: Yes Necrotic Quality: Adherent Slough Tendon Exposed: No Muscle Exposed: No Joint Exposed: No Bone Exposed: No Treatment Notes Wound #1 (Foot) Wound Laterality: Left, Medial Cleanser Wound Cleanser Discharge Instruction: Cleanse the wound with wound cleanser prior to applying a clean dressing using gauze sponges, not tissue or cotton balls. Peri-Wound Care Topical Primary Dressing Apligraf Discharge Instruction: DO NOT REMOVE Secondary Dressing Woven Gauze Sponge, Non-Sterile 4x4 in Discharge Instruction: In clinic. Home Health to apply wound vac. Secured With [employer], 4.5x3.1 (in/yd) Discharge Instruction: In clinic. Home Health to apply wound vac. 2M Medipore H Soft Cloth Surgical T ape, 4 x 10 (in/yd) Discharge Instruction: In clinic. Home Health to apply wound vac. Compression Wrap Compression Stockings Add-Ons Electronic Signature(s) Signed: 02/11/2022 4:42:04 PM By: Adline Peals Entered By: Adline Peals on 02/11/2022 11:18:15 -------------------------------------------------------------------------------- Vitals Details Patient Name: Date of Service: NA Colin Colin Sportsman A. 02/11/2022 10:45 A M Medical Record Number: [id number] Patient Account Number: [id number] Date of Birth/Sex: Treating RN: December 18, 1955 (66 y.o. [name] Primary Care Henok Heacock: [name] Other Clinician: Referring [place]ua: Treating Yaslin Kirtley/Extender: [name] Weeks in Treatment: 24 Vital Signs Time Taken: 11:08 Temperature (F): 97.3 Height (in): 74 Pulse (bpm): 66 Weight (lbs): 186 Respiratory Rate (breaths/min): 16 Body Mass Index (BMI): 23.9 Blood Pressure (mmHg): 156/87 Capillary Blood Glucose (mg/dl): 169 Reference Range: 80 - 120 mg / dl Electronic Signature(s) Signed: 02/15/2022 12:19:15 PM By: Blanche East RN Entered By: Blanche East on 02/11/2022 11:09:09

## 2022-02-16 ENCOUNTER — Encounter (HOSPITAL_BASED_OUTPATIENT_CLINIC_OR_DEPARTMENT_OTHER): Payer: BC Managed Care – PPO | Admitting: General Surgery

## 2022-02-16 DIAGNOSIS — T8131XA Disruption of external operation (surgical) wound, not elsewhere classified, initial encounter: Secondary | ICD-10-CM | POA: Diagnosis not present

## 2022-02-17 ENCOUNTER — Encounter (HOSPITAL_BASED_OUTPATIENT_CLINIC_OR_DEPARTMENT_OTHER): Payer: BC Managed Care – PPO | Admitting: General Surgery

## 2022-02-17 DIAGNOSIS — T8131XA Disruption of external operation (surgical) wound, not elsewhere classified, initial encounter: Secondary | ICD-10-CM | POA: Diagnosis not present

## 2022-02-17 DIAGNOSIS — E11610 Type 2 diabetes mellitus with diabetic neuropathic arthropathy: Secondary | ICD-10-CM | POA: Diagnosis not present

## 2022-02-17 DIAGNOSIS — E11621 Type 2 diabetes mellitus with foot ulcer: Secondary | ICD-10-CM | POA: Diagnosis not present

## 2022-02-17 DIAGNOSIS — M86672 Other chronic osteomyelitis, left ankle and foot: Secondary | ICD-10-CM | POA: Diagnosis not present

## 2022-02-17 DIAGNOSIS — L97324 Non-pressure chronic ulcer of left ankle with necrosis of bone: Secondary | ICD-10-CM | POA: Diagnosis not present

## 2022-02-17 DIAGNOSIS — I2510 Atherosclerotic heart disease of native coronary artery without angina pectoris: Secondary | ICD-10-CM | POA: Diagnosis not present

## 2022-02-17 LAB — GLUCOSE, CAPILLARY
Glucose-Capillary: 194 mg/dL — ABNORMAL HIGH (ref 70–99)
Glucose-Capillary: 170 mg/dL — ABNORMAL HIGH (ref 70–99)

## 2022-02-17 NOTE — Progress Notes (Addendum)
[name], [name] ([id number]) Visit Report for 02/17/2022 Arrival Information Details Patient Name: Date of Service: NA [name] 02/17/2022 8:00 A M Medical Record Number: [id number] Patient Account Number: [id number] Date of Birth/Sex: Treating RN: 20-Feb-1956 (66 y.o. [name] Primary Care Davida Falconi: [name] Other Clinician: [name] Referring Dangela How: Treating Lasundra Hascall/Extender: [name] in Treatment: 25 Visit Information History Since Last Visit All ordered tests and consults were completed: Yes Patient Arrived: Knee Scooter Added or deleted any medications: No Arrival Time: 08:20 Any new allergies or adverse reactions: No Accompanied By: Wife Had a fall or experienced change in No Transfer Assistance: None activities of daily living that may affect Patient Identification Verified: Yes risk of falls: Secondary Verification Process Completed: Yes Signs or symptoms of abuse/neglect since last visito No Patient Requires Transmission-Based Precautions: No Hospitalized since last visit: No Patient Has Alerts: Yes Implantable device outside of the clinic excluding No Patient Alerts: Patient on Blood Thinner cellular tissue based products placed in the center since last visit: Pain Present Now: No Electronic Signature(s) Signed: 02/17/2022 1:45:56 PM By: [name] EMT Entered By: [name] on 02/17/2022 13:45:55 -------------------------------------------------------------------------------- Encounter Discharge Information Details Patient Name: Date of Service: NA Colin [name] 02/17/2022 8:00 A M Medical Record Number: [id number] Patient Account Number: [id number] Date of Birth/Sex: Treating RN: 1956/04/13 (66 y.o. [name] Primary Care Victorino Fatzinger: [name] Other Clinician: [name] Referring Cricket Goodlin: Treating Adil Tugwell/Extender: [name]: 25 Encounter Discharge  Information Items Discharge Condition: Stable Ambulatory Status: Knee Scooter Discharge Destination: Home Transportation: Private Auto Accompanied By: Wife Schedule Follow-up Appointment: Yes Clinical Summary of Care: Electronic Signature(s) Signed: 02/17/2022 1:50:47 PM By: [name] EMT Entered By: [name] on 02/17/2022 13:50:46 -------------------------------------------------------------------------------- Vitals Details Patient Name: Date of Service: NA [name], [name] 02/17/2022 8:00 A M Medical Record Number: [id number] Patient Account Number: [id number] Date of Birth/Sex: Treating RN: 1955/08/22 (66 y.o. [name] Primary Care Kaelon Weekes: [name] Other Clinician: [name] Referring Darcel Frane: Treating Nakema Fake/Extender: [name]: 25 Vital Signs Time Taken: 08:36 Temperature (F): 97.4 Height (in): 74 Pulse (bpm): 75 Weight (lbs): 186 Respiratory Rate (breaths/min): 16 Body Mass Index (BMI): 23.9 Blood Pressure (mmHg): 136/75 Capillary Blood Glucose (mg/dl): 353 Reference Range: 80 - 120 mg / dl Electronic Signature(s) Signed: 02/17/2022 1:46:31 PM By: [name] EMT Entered By: [name] on 02/17/2022 13:46:31

## 2022-02-17 NOTE — Progress Notes (Addendum)
[name], [name] ([id number]) Visit Report for 02/17/2022 HBO Details Patient Name: Date of Service: NA [name] A. 02/17/2022 8:00 A M Medical Record Number: [id number] Patient Account Number: [id number] Date of Birth/Sex: Treating RN: 09-01-1955 (66 y.o. [name] Primary Care [name]: [name] Other Clinician: [name] Referring [name]: Treating [name]/Extender: [name] Weeks in Treatment: 25 HBO Treatment Course Details Treatment Course Number: 1 Ordering [name]: Duanne [name] T Treatments Ordered: otal 120 HBO Treatment Start Date: 09/20/2021 HBO Indication: Diabetic Ulcer(s) of the Lower Extremity Standard/Conservative Wound Care tried and failed greater than or equal to 30 days HBO Treatment Details Treatment Number: 96 Patient Type: Outpatient Chamber Type: Monoplace Chamber Serial #: [id number] Treatment Protocol: 2.0 ATA with 90 minutes oxygen, with two 5 minute air breaks Treatment Details Compression Rate Down: 2.0 psi / minute De-Compression Rate Up: 2.0 psi / minute A breaks and breathing ir Compress Tx Pressure periods Decompress Decompress Begins Reached (leave unused spaces Begins Ends blank) Chamber Pressure (ATA 1 2 2 2 2 2  --2 1 ) Clock Time (24 hr) 08:43 08:55 09:25 09:30 10:00 10:05 - - 10:35 10:47 Treatment Length: 124 (minutes) Treatment Segments: 4 Vital Signs Capillary Blood Glucose Reference Range: 80 - 120 mg / dl HBO Diabetic Blood Glucose Intervention Range: <131 mg/dl or mg/dl Time Vitals Blood Respiratory Capillary Blood Glucose Pulse Action Type: Pulse: Temperature: Taken: Pressure: Rate: Glucose (mg/dl): Meter #: Oximetry (%) Taken: Pre 08:36 136/75 75 16 97.4 194 Post 10:50 168/95 70 16 98 170 Treatment Response Treatment Toleration: Well Treatment Completion Status: Treatment Completed without Adverse Event Additional Procedure Documentation Tissue Sevierity: Necrosis of  bone Physician HBO Attestation: I certify that I supervised this HBO treatment in accordance with Medicare guidelines. A trained emergency response team is readily available per Yes hospital policies and procedures. Continue HBOT as ordered. Yes Electronic Signature(s) Signed: 02/17/2022 1:57:35 PM By: 04/19/2022 MD FACS Previous Signature: 02/17/2022 1:49:34 PM Version By: 04/19/2022 EMT Entered By: [name] on 02/17/2022 13:57:34 -------------------------------------------------------------------------------- HBO Safety Checklist Details Patient Name: Date of Service: NA 04/19/2022 A. 02/17/2022 8:00 A M Medical Record Number: 04/19/2022 Patient Account Number: [id number] Date of Birth/Sex: Treating RN: 1956-02-12 (66 y.o. 77 Primary Care Breeanna Galgano: [name] Other Clinician: [name] Referring [name]: Treating [name]/Extender: [name] Weeks in Treatment: 25 HBO Safety Checklist Items Safety Checklist Consent Form Signed Patient voided / foley secured and emptied When did you last eato 0645 Last dose of injectable or oral agent 0645 Ostomy pouch emptied and vented if applicable NA All implantable devices assessed, documented and approved NA Intravenous access site secured and place NA Valuables secured Linens and cotton and cotton/polyester blend (less than 51% polyester) Personal oil-based products / skin lotions / body lotions removed Wigs or hairpieces removed NA Smoking or tobacco materials removed Books / newspapers / magazines / loose paper removed Cologne, aftershave, perfume and deodorant removed Jewelry removed (may wrap wedding band) NA Make-up removed NA Hair care products removed Battery operated devices (external) removed Heating patches and chemical warmers removed Titanium eyewear removed NA Nail polish cured greater than 10 hours NA Casting material cured greater than 10  hours NA Hearing aids removed NA Loose dentures or partials removed NA Prosthetics have been removed NA Patient demonstrates correct use of air break device (if applicable) Patient concerns have been addressed Patient grounding bracelet on and cord attached to chamber Specifics for Inpatients (complete  in addition to above) Medication sheet sent with patient NA Intravenous medications needed or due during therapy sent with patient NA Drainage tubes (e.g. nasogastric tube or chest tube secured and vented) NA Endotracheal or Tracheotomy tube secured NA Cuff deflated of air and inflated with saline NA Airway suctioned NA Notes The safety checklist was done before the treatment was started Electronic Signature(s) Signed: 02/17/2022 1:47:32 PM By: [name] EMT Entered By: [name] on 02/17/2022 13:47:32

## 2022-02-17 NOTE — Progress Notes (Signed)
YASIN, DUCAT ([id number]) Visit Report for 02/17/2022 Problem List Details Patient Name: Date of Service: NA [name] 02/17/2022 8:00 A M Medical Record Number: [id number] Patient Account Number: [id number] Date of Birth/Sex: Treating RN: 1956/05/02 (66 y.o. 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 02/17/2022 Medical Record Number: [id number] Patient Account Number: [id number] Date of Birth/Sex: Treating RN: 07-05-1955 (66 y.o. 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 Facility Procedures CPT4 Code: 20254270 Description: G0277-(Facility Use Only) HBOT full body chamber, , ICD-10 Diagnosis Description E11.621 Type 2 diabetes mellitus with foot ulcer 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 M86.672 Other chronic  osteomyelitis, left ankle and foot 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

## 2022-02-18 ENCOUNTER — Encounter (HOSPITAL_BASED_OUTPATIENT_CLINIC_OR_DEPARTMENT_OTHER): Payer: BC Managed Care – PPO | Admitting: General Surgery

## 2022-02-18 DIAGNOSIS — T8131XA Disruption of external operation (surgical) wound, not elsewhere classified, initial encounter: Secondary | ICD-10-CM | POA: Diagnosis not present

## 2022-02-18 DIAGNOSIS — M86672 Other chronic osteomyelitis, left ankle and foot: Secondary | ICD-10-CM | POA: Diagnosis not present

## 2022-02-18 DIAGNOSIS — E11610 Type 2 diabetes mellitus with diabetic neuropathic arthropathy: Secondary | ICD-10-CM | POA: Diagnosis not present

## 2022-02-18 DIAGNOSIS — I2510 Atherosclerotic heart disease of native coronary artery without angina pectoris: Secondary | ICD-10-CM | POA: Diagnosis not present

## 2022-02-18 DIAGNOSIS — L97324 Non-pressure chronic ulcer of left ankle with necrosis of bone: Secondary | ICD-10-CM | POA: Diagnosis not present

## 2022-02-18 DIAGNOSIS — E11621 Type 2 diabetes mellitus with foot ulcer: Secondary | ICD-10-CM | POA: Diagnosis not present

## 2022-02-18 LAB — GLUCOSE, CAPILLARY
Glucose-Capillary: 128 mg/dL — ABNORMAL HIGH (ref 70–99)
Glucose-Capillary: 213 mg/dL — ABNORMAL HIGH (ref 70–99)

## 2022-02-18 NOTE — Progress Notes (Signed)
WILLY, PINKERTON ([id number]) Visit Report for 02/18/2022 Problem List Details Patient Name: Date of Service: NA Colin Nephew A. 02/18/2022 8:00 A M Medical Record Number: [id number] Patient Account Number: [id number] Date of Birth/Sex: Treating RN: May 02, 1956 (66 y.o. 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 02/18/2022 Medical Record Number: [id number] Patient Account Number: [id number] Date of Birth/Sex: Treating RN: [birth year] (66 y.o. 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 Facility Procedures CPT4 Code: 81448185 Description: G0277-(Facility Use Only) HBOT full body chamber, , ICD-10 Diagnosis Description E11.621 Type 2 diabetes mellitus with foot ulcer 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 M86.672 Other chronic  osteomyelitis, left ankle and foot 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

## 2022-02-18 NOTE — Progress Notes (Signed)
DEVELL, PARKERSON ([id number]) Visit Report for 02/18/2022 Arrival Information Details Patient Name: Date of Service: NA [name] 02/18/2022 10:45 A M Medical Record Number: [id number] Patient Account Number: [id number] Date of Birth/Sex: Treating RN: 11-20-1955 (66 y.o. [name] Primary Care Trasean Delima: [name] Other Clinician: Referring Terrall Bley: Treating Jaqwan Wieber/Extender: [name] in Treatment: 25 Visit Information History Since Last Visit Added or deleted any medications: No Patient Arrived: Knee Scooter Any new allergies or adverse reactions: No Arrival Time: 11:11 Had a fall or experienced change in No Accompanied By: self activities of daily living that may affect Transfer Assistance: None risk of falls: Patient Identification Verified: Yes Signs or symptoms of abuse/neglect since last visito No Secondary Verification Process Completed: Yes Hospitalized since last visit: No Patient Requires Transmission-Based Precautions: No Implantable device outside of the clinic excluding No Patient Has Alerts: Yes cellular tissue based products placed in the center Patient Alerts: Patient on Blood Thinner since last visit: Has Dressing in Place as Prescribed: Yes Pain Present Now: No Electronic Signature(s) Signed: 02/18/2022 5:20:05 PM By: Samuella Bruin Entered By: Samuella Bruin on 02/18/2022 11:12:03 -------------------------------------------------------------------------------- Encounter Discharge Information Details Patient Name: Date of Service: NA [name] 02/18/2022 10:45 A M Medical Record Number: [id number] Patient Account Number: [id number] Date of Birth/Sex: Treating RN: Aug 20, 1955 (66 y.o. [name] Primary Care Kazimierz Springborn: [name] Other Clinician: Referring Mikalyn Hermida: Treating Lorrinda Ramstad/Extender: [name] in Treatment: 25 Encounter Discharge Information Items Post  Procedure Vitals Discharge Condition: Stable Temperature (F): 97.8 Ambulatory Status: Knee Scooter Pulse (bpm): 69 Discharge Destination: Home Respiratory Rate (breaths/min): 16 Transportation: Private Auto Blood Pressure (mmHg): 165/87 Accompanied By: self Schedule Follow-up Appointment: Yes Clinical Summary of Care: Patient Declined Electronic Signature(s) Signed: 02/18/2022 5:20:05 PM By: Samuella Bruin Entered By: Samuella Bruin on 02/18/2022 14:42:11 -------------------------------------------------------------------------------- Lower Extremity Assessment Details Patient Name: Date of Service: [name] 02/18/2022 10:45 A M Medical Record Number: [id number] Patient Account Number: [id number] Date of Birth/Sex: Treating RN: 1955/07/18 (66 y.o. [name] Primary Care Tyler Robidoux: [name] Other Clinician: Referring Yeng Frankie: Treating Janayia Burggraf/Extender: [name]: 25 Edema Assessment Assessed: [name]: No] [Right: No] Edema: [Left: Ye] [Right: s] Calf Left: Right: Point of Measurement: From Medial Instep 33.3 cm Ankle Left: Right: Point of Measurement: From Medial Instep 23.9 cm Vascular Assessment Pulses: Dorsalis Pedis Palpable: [Left:Yes] Electronic Signature(s) Signed: 02/18/2022 5:20:05 PM By: Samuella Bruin Entered By: Samuella Bruin on 02/18/2022 11:18:17 -------------------------------------------------------------------------------- Multi Wound Chart Details Patient Name: Date of Service: NA [name] 02/18/2022 10:45 A M Medical Record Number: [id number] Patient Account Number: [id number] Date of Birth/Sex: Treating RN: 17-Mar-1956 (66 y.o. [name] Primary Care Havoc Sanluis: [name] Other Clinician: Referring Ricardo Kayes: Treating Sheana Bir/Extender: [name]: 25 Vital Signs Height(in): 74 Capillary Blood Glucose(mg/dl):  937 Weight(lbs): 169 Pulse(bpm): 69 Body Mass Index(BMI): 23.9 Blood Pressure(mmHg): 165/87 Temperature(F): 97.8 Respiratory Rate(breaths/min): 16 Photos: [1:Left, Medial Foot] [N/A:N/A N/A] Wound Location: [1:Gradually Appeared] [N/A:N/A] Wounding Event: [1:Diabetic Wound/Ulcer of the Lower] [N/A:N/A] Primary Etiology: [1:Extremity Cataracts, Coronary Artery Disease, N/A] Comorbid History: [1:Hypertension, Myocardial Infarction, Peripheral Arterial Disease, Type II Diabetes, Osteomyelitis, Neuropathy 02/22/2021] [N/A:N/A] Date Acquired: [1:25] [N/A:N/A] Weeks of Treatment: [1:Open] [N/A:N/A] Wound Status: [1:No] [N/A:N/A] Wound Recurrence: [1:5.6x2.3x0.1] [N/A:N/A] Measurements L x W x D (cm) [1:10.116] [N/A:N/A] A (cm) : rea [1:1.012] [N/A:N/A] Volume (cm) : [1:28.40%] [N/A:N/A] % Reduction in  A [1:rea: 92.80%] [N/A:N/A] % Reduction in Volume: [1:Grade 3] [N/A:N/A] Classification: [1:Medium] [N/A:N/A] Exudate A mount: [1:Serosanguineous] [N/A:N/A] Exudate Type: [1:red, brown] [N/A:N/A] Exudate Color: [1:Distinct, outline attached] [N/A:N/A] Wound Margin: [1:Large (67-100%)] [N/A:N/A] Granulation A mount: [1:Red, Hyper-granulation] [N/A:N/A] Granulation Quality: [1:Small (1-33%)] [N/A:N/A] Necrotic A mount: [1:Fat Layer (Subcutaneous Tissue): Yes N/A] Exposed Structures: [1:Fascia: No Tendon: No Muscle: No Joint: No Bone: No Medium (34-66%)] [N/A:N/A] Epithelialization: [1:Debridement - Selective/Open Wound N/A] Debridement: Pre-procedure Verification/Time Out 11:29 [N/A:N/A] Taken: [1:Slough] [N/A:N/A] Tissue Debrided: [1:Non-Viable Tissue] [N/A:N/A] Level: [1:12.88] [N/A:N/A] Debridement A (sq cm): [1:rea Curette] [N/A:N/A] Instrument: [1:Minimum] [N/A:N/A] Bleeding: [1:Pressure] [N/A:N/A] Hemostasis A chieved: [1:0] [N/A:N/A] Procedural Pain: [1:0] [N/A:N/A] Post Procedural Pain: [1:Procedure was tolerated well] [N/A:N/A] Debridement Treatment Response:  [1:5.6x2.3x0.1] [N/A:N/A] Post Debridement Measurements L x W x D (cm) [1:1.012] [N/A:N/A] Post Debridement Volume: (cm) [1:Cellular or Tissue Based Product] [N/A:N/A] Procedures Performed: [1:Debridement Negative Pressure Wound Therapy Maintenance (NPWT)] Treatment Notes Electronic Signature(s) Signed: 02/18/2022 11:50:11 AM By: [name] MD FACS Signed: 02/18/2022 5:20:05 PM By: Samuella Bruin Entered By: [name] on 02/18/2022 11:50:11 -------------------------------------------------------------------------------- Multi-Disciplinary Care Plan Details Patient Name: Date of Service: NA [name] 02/18/2022 10:45 A M Medical Record Number: [id number] Patient Account Number: [id number] Date of Birth/Sex: Treating RN: July 14, 1955 (66 y.o. [name] Primary Care Aldrich Lloyd: [name] Other Clinician: Referring Chosen Garron: Treating Jaquetta Currier/Extender: [name] in Treatment: 25 Multidisciplinary Care Plan reviewed with physician Active Inactive HBO Nursing Diagnoses: Anxiety related to feelings of confinement associated with the hyperbaric oxygen chamber Anxiety related to knowledge deficit of hyperbaric oxygen therapy and treatment procedures Discomfort related to temperature and humidity changes inside hyperbaric chamber Potential for barotraumas to ears, sinuses, teeth, and lungs or cerebral gas embolism related to changes in atmospheric pressure inside hyperbaric oxygen chamber Potential for oxygen toxicity seizures related to delivery of 100% oxygen at an increased atmospheric pressure Potential for pulmonary oxygen toxicity related to delivery of 100% oxygen at an increased atmospheric pressure Goals: Barotrauma will be prevented during HBO2 Date Initiated: 09/17/2021 T arget Resolution Date: 03/11/2022 Goal Status: Active Patient will tolerate the hyperbaric oxygen therapy treatment Date Initiated: 09/17/2021 T arget  Resolution Date: 03/11/2022 Goal Status: Active Patient will tolerate the internal climate of the chamber Date Initiated: 09/17/2021 T arget Resolution Date: 03/11/2022 Goal Status: Active Patient/caregiver will verbalize understanding of HBO goals, rationale, procedures and potential hazards Date Initiated: 09/17/2021 T arget Resolution Date: 03/11/2022 Goal Status: Active Signs and symptoms of pulmonary oxygen toxicity will be recognized and promptly addressed Date Initiated: 09/17/2021 T arget Resolution Date: 03/11/2022 Goal Status: Active Signs and symptoms of seizure will be recognized and promptly addressed ; seizing patients will suffer no harm Date Initiated: 09/17/2021 T arget Resolution Date: 03/11/2022 Goal Status: Active Interventions: Administer decongestants, per physician orders, prior to HBO2 Administer the correct therapeutic gas delivery based on the patients needs and limitations, per physician order Assess and provide for patients comfort related to the hyperbaric environment and equalization of middle ear Assess for signs and symptoms related to adverse events, including but not limited to confinement anxiety, pneumothorax, oxygen toxicity and baurotrauma Assess patient for any history of confinement anxiety Assess patient's knowledge and expectations regarding hyperbaric medicine and provide education related to the hyperbaric environment, goals of treatment and prevention of adverse events Implement protocols to decrease risk of pneumothorax in high risk patients Notes: Nutrition Nursing Diagnoses: Impaired glucose control: actual or potential Goals: Patient/caregiver verbalizes understanding of need to  maintain therapeutic glucose control per primary care physician Date Initiated: 08/25/2021 Target Resolution Date: 03/11/2022 Goal Status: Active Interventions: Assess HgA1c results as ordered upon admission and as needed Provide education on elevated blood sugars and  impact on wound healing Notes: Osteomyelitis Nursing Diagnoses: Infection: osteomyelitis Knowledge deficit related to disease process and management Goals: Patient's osteomyelitis will resolve Date Initiated: 09/17/2021 Target Resolution Date: 03/11/2022 Goal Status: Active Interventions: Assess for signs and symptoms of osteomyelitis resolution every visit Provide education on osteomyelitis Treatment Activities: Surgical debridement : 09/17/2021 Systemic antibiotics : 09/17/2021 T ordered outside of clinic : 09/17/2021 est Notes: Electronic Signature(s) Signed: 02/18/2022 5:20:05 PM By: Samuella Bruin Entered By: Samuella Bruin on 02/18/2022 11:23:54 -------------------------------------------------------------------------------- Negative Pressure Wound Therapy Maintenance (NPWT) Details Patient Name: Date of Service: [name] 02/18/2022 10:45 A M Medical Record Number: [id number] Patient Account Number: [id number] Date of Birth/Sex: Treating RN: May 30, 1956 (66 y.o. [name] Primary Care Tyge Somers: [name] Other Clinician: Referring Toula Miyasaki: Treating Karla Vines/Extender: [name]: 25 NPWT Maintenance Performed for: Wound #1 Left, Medial Foot Performed By: Samuella Bruin, RN Type: VAC System Coverage Size (sq cm): 12.88 Pressure Type: Constant Pressure Setting: 100 mmHG Drain Type: None Sponge/Dressing Type: Foam, Black Date Initiated: 03/19/2021 Dressing Removed: Yes Quantity of Sponges/Gauze Removed: 1 Canister Changed: Yes Dressing Reapplied: Yes Quantity of Sponges/Gauze Inserted: 1 Days On NPWT : 337 Post Procedure Diagnosis Same as Pre-procedure Electronic Signature(s) Signed: 02/18/2022 5:20:05 PM By: Samuella Bruin Entered By: Samuella Bruin on 02/18/2022 11:32:45 -------------------------------------------------------------------------------- Pain Assessment Details Patient  Name: Date of Service: NA [name] 02/18/2022 10:45 A M Medical Record Number: [id number] Patient Account Number: [id number] Date of Birth/Sex: Treating RN: Mar 20, 1956 (66 y.o. [name] Primary Care Hailley Byers: [name] Other Clinician: Referring Jaelene Garciagarcia: Treating Julea Hutto/Extender: [name]: 25 Active Problems Location of Pain Severity and Description of Pain Patient Has Paino No Site Locations Rate the pain. Rate the pain. Current Pain Level: 0 Pain Management and Medication Current Pain Management: Electronic Signature(s) Signed: 02/18/2022 5:20:05 PM By: Samuella Bruin Entered By: Samuella Bruin on 02/18/2022 11:12:31 -------------------------------------------------------------------------------- Patient/Caregiver Education Details Patient Name: Date of Service: NA [name] 9/8/2023andnbsp10:45 A M Medical Record Number: [id number] Patient Account Number: [id number] Date of Birth/Gender: Treating RN: [birth year] (66 y.o. [name] Primary Care Physician: [name] Other Clinician: Referring Physician: Treating Physician/Extender: [name] in Treatment: 25 Education Assessment Education Provided To: Patient Education Topics Provided Wound/Skin Impairment: Methods: Explain/Verbal Responses: Reinforcements needed, State content correctly Electronic Signature(s) Signed: 02/18/2022 5:20:05 PM By: Samuella Bruin Entered By: Samuella Bruin on 02/18/2022 11:24:05 -------------------------------------------------------------------------------- Wound Assessment Details Patient Name: Date of Service: [name] 02/18/2022 10:45 A M Medical Record Number: [id number] Patient Account Number: [id number] Date of Birth/Sex: Treating RN: [birth year] (66 y.o. [name] Primary Care Dionel Archey: [name] Other Clinician: Referring  Ahriyah Vannest: Treating Kristan Brummitt/Extender: [name]: 25 Wound Status Wound Number: 1 Primary Diabetic Wound/Ulcer of the Lower Extremity Etiology: Wound Location: Left, Medial Foot Wound Open Wounding Event: Gradually Appeared Status: Date Acquired: 02/22/2021 Comorbid Cataracts, Coronary Artery Disease, Hypertension, Myocardial Weeks Of Treatment: 25 History: Infarction, Peripheral Arterial Disease, Type II Diabetes, Clustered Wound: No Osteomyelitis, Neuropathy Photos Wound Measurements Length: (cm) 5.6 Width: (cm) 2.3 Depth: (cm) 0.1 Area: (cm) 10.116 Volume: (cm) 1.012 % Reduction in Area: 28.4% % Reduction in  Volume: 92.8% Epithelialization: Medium (34-66%) Tunneling: No Undermining: No Wound Description Classification: Grade 3 Wound Margin: Distinct, outline attached Exudate Amount: Medium Exudate Type: Serosanguineous Exudate Color: red, brown Foul Odor After Cleansing: No Slough/Fibrino Yes Wound Bed Granulation Amount: Large (67-100%) Exposed Structure Granulation Quality: Red, Hyper-granulation Fascia Exposed: No Necrotic Amount: Small (1-33%) Fat Layer (Subcutaneous Tissue) Exposed: Yes Necrotic Quality: Adherent Slough Tendon Exposed: No Muscle Exposed: No Joint Exposed: No Bone Exposed: No Treatment Notes Wound #1 (Foot) Wound Laterality: Left, Medial Cleanser Wound Cleanser Discharge Instruction: Cleanse the wound with wound cleanser prior to applying a clean dressing using gauze sponges, not tissue or cotton balls. Peri-Wound Care Topical Primary Dressing Apligraf Discharge Instruction: DO NOT REMOVE Secondary Dressing Woven Gauze Sponge, Non-Sterile 4x4 in Discharge Instruction: In clinic. Home Health to apply wound vac. Secured With Elastic Bandage 4 inch (ACE bandage) Discharge Instruction: Secure with ACE bandage as directed. Kerlix Roll Sterile, 4.5x3.1 (in/yd) Discharge Instruction: In clinic. Home  Health to apply wound vac. 28M Medipore H Soft Cloth Surgical Tape, 4 x 10 (in/yd) Discharge Instruction: In clinic. Home Health to apply wound vac. Compression Wrap Compression Stockings Add-Ons Electronic Signature(s) Signed: 02/18/2022 5:20:05 PM By: Samuella Bruin Entered By: Samuella Bruin on 02/18/2022 11:20:08 -------------------------------------------------------------------------------- Vitals Details Patient Name: Date of Service: NA [name] 02/18/2022 10:45 A M Medical Record Number: [id number] Patient Account Number: [id number] Date of Birth/Sex: Treating RN: [birth year] (66 y.o. [name] Primary Care Cally Nygard: [name] Other Clinician: Referring Maveryck Bahri: Treating Marne Meline/Extender: [name]: 25 Vital Signs Time Taken: 11:12 Temperature (F): 97.8 Height (in): 74 Pulse (bpm): 69 Weight (lbs): 186 Respiratory Rate (breaths/min): 16 Body Mass Index (BMI): 23.9 Blood Pressure (mmHg): 165/87 Capillary Blood Glucose (mg/dl): 923 Reference Range: 80 - 120 mg / dl Electronic Signature(s) Signed: 02/18/2022 5:20:05 PM By: Samuella Bruin Entered By: Samuella Bruin on 02/18/2022 11:12:25

## 2022-02-18 NOTE — Progress Notes (Addendum)
[name], [name] ([id number]) Visit Report for 02/18/2022 HBO Details Patient Name: Date of Service: NA Colin Nephew A. 02/18/2022 8:00 A M Medical Record Number: [id number] Patient Account Number: [id number] Date of Birth/Sex: Treating RN: 08/18/1955 (66 y.o. [name] Primary Care Tharon Kitch: [name] Other Clinician: [name] Referring Juanya Villavicencio: Treating Donnis Pecha/Extender: [name]: 25 HBO Treatment Course Details Treatment Course Number: 1 Ordering Meriem Lemieux: [name] T Treatments Ordered: otal 120 HBO Treatment Start Date: 09/20/2021 HBO Indication: Diabetic Ulcer(s) of the Lower Extremity Standard/Conservative Wound Care tried and failed greater than or equal to 30 days HBO Treatment Details Treatment Number: 97 Patient Type: Outpatient Chamber Type: Monoplace Chamber Serial #: [id number] Treatment Protocol: 2.0 ATA with 90 minutes oxygen, with two 5 minute air breaks Treatment Details Compression Rate Down: 2.0 psi / minute De-Compression Rate Up: 2.0 psi / minute A breaks and breathing ir Compress Tx Pressure periods Decompress Decompress Begins Reached (leave unused spaces Begins Ends blank) Chamber Pressure (ATA 1 2 2 2 2 2  --2 1 ) Clock Time (24 hr) 09:01 09:14 09:44 09:49 10:19 10:24 - - 10:54 11:07 Treatment Length: 126 (minutes) Treatment Segments: 4 Vital Signs Capillary Blood Glucose Reference Range: 80 - 120 mg / dl HBO Diabetic Blood Glucose Intervention Range: <131 mg/dl or mg/dl Type: Time Vitals Blood Pulse: Respiratory Capillary Blood Glucose Pulse Action Temperature: Taken: Pressure: Rate: Glucose (mg/dl): Meter #: Oximetry (%) Taken: Pre 08:53 124/78 77 18 98.1 128 Patient given 8 oz glucerna Post 11:08 165/87 69 16 97.8 213 Treatment Response Treatment Toleration: Well Treatment Completion Status: Treatment Completed without Adverse Event Treatment Notes Informed Dr. >323 of  the patient blood sugar, and that I had given the patient 8 oz Glucerna. Dr. Lady Gary stated to start treatment. Additional Procedure Documentation Tissue Sevierity: Necrosis of bone Physician HBO Attestation: I certify that I supervised this HBO treatment in accordance with Medicare guidelines. A trained emergency response team is readily available per Yes hospital policies and procedures. Continue HBOT as ordered. Yes Electronic Signature(s) Signed: 02/18/2022 4:51:43 PM By: 04/20/2022 MD FACS Previous Signature: 02/18/2022 12:39:16 PM Version By: 04/20/2022 EMT Entered By: [name] on 02/18/2022 16:51:43 -------------------------------------------------------------------------------- HBO Safety Checklist Details Patient Name: Date of Service: NA 04/20/2022 A. 02/18/2022 8:00 A M Medical Record Number: 04/20/2022 Patient Account Number: [id number] Date of Birth/Sex: Treating RN: 1956-05-30 (66 y.o. 77 Primary Care Sansa Alkema: [name] Other Clinician: [name] Referring Kaira Stringfield: Treating Jessikah Dicker/Extender: [name]: 25 HBO Safety Checklist Items Safety Checklist Consent Form Signed Patient voided / foley secured and emptied When did you last eato 0645 Last dose of injectable or oral agent 0645 Ostomy pouch emptied and vented if applicable NA All implantable devices assessed, documented and approved NA Intravenous access site secured and place NA Valuables secured Linens and cotton and cotton/polyester blend (less than 51% polyester) Personal oil-based products / skin lotions / body lotions removed Wigs or hairpieces removed NA Smoking or tobacco materials removed Books / newspapers / magazines / loose paper removed Cologne, aftershave, perfume and deodorant removed Jewelry removed (may wrap wedding band) NA Make-up removed NA Hair care products removed Battery operated devices (external)  removed Heating patches and chemical warmers removed Titanium eyewear removed NA Nail polish cured greater than 10 hours NA Casting material cured greater than 10 hours NA Hearing aids removed NA Loose dentures or partials removed NA Prosthetics have  been removed NA Patient demonstrates correct use of air break device (if applicable) Patient concerns have been addressed Patient grounding bracelet on and cord attached to chamber Specifics for Inpatients (complete in addition to above) Medication sheet sent with patient NA Intravenous medications needed or due during therapy sent with patient NA Drainage tubes (e.g. nasogastric tube or chest tube secured and vented) NA Endotracheal or Tracheotomy tube secured NA Cuff deflated of air and inflated with saline NA Airway suctioned NA Notes The safety checklist was done before the treatment was started. The patient stated that he had spilled gas on his boot, before coming in today. I did not smell any gas on the patient, after he had changed into scrubs. He stated that the gas got only on the bottom of his boot. Electronic Signature(s) Signed: 02/18/2022 12:44:12 PM By: [name] EMT Previous Signature: 02/18/2022 12:24:58 PM Version By: [name] EMT Entered By: [name] on 02/18/2022 12:44:12

## 2022-02-18 NOTE — Progress Notes (Addendum)
[name], [name] ([id number]) Visit Report for 02/18/2022 Arrival Information Details Patient Name: Date of Service: NA [name] 02/18/2022 8:00 A M Medical Record Number: [id number] Patient Account Number: [id number] Date of Birth/Sex: Treating RN: 29-Aug-1955 (65 y.o. [name] Primary Care Pragya Lofaso: [name] Other Clinician: [name] Referring Izell Labat: Treating Shamecca Whitebread/Extender: [name] in Treatment: 25 Visit Information History Since Last Visit All ordered tests and consults were completed: Yes Patient Arrived: Knee Scooter Added or deleted any medications: No Arrival Time: 08:35 Any new allergies or adverse reactions: No Accompanied By: Wife Had a fall or experienced change in No Transfer Assistance: None activities of daily living that may affect Patient Identification Verified: Yes risk of falls: Secondary Verification Process Completed: Yes Signs or symptoms of abuse/neglect since last visito No Patient Requires Transmission-Based Precautions: No Hospitalized since last visit: No Patient Has Alerts: Yes Implantable device outside of the clinic excluding No Patient Alerts: Patient on Blood Thinner cellular tissue based products placed in the center since last visit: Pain Present Now: No Electronic Signature(s) Signed: 02/18/2022 12:22:42 PM By: [name] EMT Entered By: [name] on 02/18/2022 12:22:42 -------------------------------------------------------------------------------- Encounter Discharge Information Details Patient Name: Date of Service: [name] 02/18/2022 8:00 A M Medical Record Number: [id number] Patient Account Number: [id number] Date of Birth/Sex: Treating RN: 1955/12/01 (66 y.o. [name] Primary Care Iviona Hole: [name] Other Clinician: [name] Referring Vincente Asbridge: Treating Vandy Tsuchiya/Extender: [name] in Treatment: 25 Encounter  Discharge Information Items Discharge Condition: Stable Ambulatory Status: Knee Scooter Discharge Destination: Other (Note Required) Transportation: Private Auto Accompanied By: None Schedule Follow-up Appointment: Yes Clinical Summary of Care: Notes The patient had a Wound Care Clinic visit after his treatment today. Electronic Signature(s) Signed: 02/18/2022 12:41:11 PM By: [name] EMT Entered By: [name] on 02/18/2022 12:41:10 -------------------------------------------------------------------------------- Vitals Details Patient Name: Date of Service: NA [name], [name] 02/18/2022 8:00 A M Medical Record Number: [id number] Patient Account Number: [id number] Date of Birth/Sex: Treating RN: 17-Feb-1956 (66 y.o. [name] Primary Care Tiffaney Heimann: [name] Other Clinician: [name] Referring Joaopedro Eschbach: Treating Julyan Gales/Extender: [name]: 25 Vital Signs Time Taken: 08:53 Temperature (F): 98.1 Height (in): 74 Pulse (bpm): 77 Weight (lbs): 186 Respiratory Rate (breaths/min): 18 Body Mass Index (BMI): 23.9 Blood Pressure (mmHg): 124/78 Capillary Blood Glucose (mg/dl): 546 Reference Range: 80 - 120 mg / dl Electronic Signature(s) Signed: 02/18/2022 12:23:15 PM By: [name] EMT Entered By: [name] on 02/18/2022 12:23:15

## 2022-02-19 DIAGNOSIS — T8131XA Disruption of external operation (surgical) wound, not elsewhere classified, initial encounter: Secondary | ICD-10-CM | POA: Diagnosis not present

## 2022-02-20 DIAGNOSIS — T8131XA Disruption of external operation (surgical) wound, not elsewhere classified, initial encounter: Secondary | ICD-10-CM | POA: Diagnosis not present

## 2022-02-21 ENCOUNTER — Encounter (HOSPITAL_BASED_OUTPATIENT_CLINIC_OR_DEPARTMENT_OTHER): Payer: BC Managed Care – PPO | Admitting: General Surgery

## 2022-02-21 DIAGNOSIS — E11610 Type 2 diabetes mellitus with diabetic neuropathic arthropathy: Secondary | ICD-10-CM | POA: Diagnosis not present

## 2022-02-21 DIAGNOSIS — L97324 Non-pressure chronic ulcer of left ankle with necrosis of bone: Secondary | ICD-10-CM

## 2022-02-21 DIAGNOSIS — M86672 Other chronic osteomyelitis, left ankle and foot: Secondary | ICD-10-CM

## 2022-02-21 DIAGNOSIS — E11621 Type 2 diabetes mellitus with foot ulcer: Secondary | ICD-10-CM | POA: Diagnosis not present

## 2022-02-21 DIAGNOSIS — T8131XA Disruption of external operation (surgical) wound, not elsewhere classified, initial encounter: Secondary | ICD-10-CM | POA: Diagnosis not present

## 2022-02-21 DIAGNOSIS — I2510 Atherosclerotic heart disease of native coronary artery without angina pectoris: Secondary | ICD-10-CM | POA: Diagnosis not present

## 2022-02-21 LAB — GLUCOSE, CAPILLARY
Glucose-Capillary: 232 mg/dL — ABNORMAL HIGH (ref 70–99)
Glucose-Capillary: 275 mg/dL — ABNORMAL HIGH (ref 70–99)

## 2022-02-21 NOTE — Progress Notes (Signed)
SEVE, MONETTE ([id number]) Visit Report for 02/18/2022 Chief Complaint Document Details Patient Name: Date of Service: NA [name] 02/18/2022 10:45 A M Medical Record Number: [id number] Patient Account Number: [id number] Date of Birth/Sex: Treating RN: April 05, 1956 (66 y.o. [name] Primary Care Provider: [name] Other Clinician: Referring Provider: Treating Provider/Extender: [name] in Treatment: 25 Information Obtained from: Patient Chief Complaint Patients presents for treatment of an open diabetic ulcer with exposed bone and osteomyelitis Electronic Signature(s) Signed: 02/18/2022 11:50:20 AM By: [name] MD FACS Entered By: [name] on 02/18/2022 11:50:20 -------------------------------------------------------------------------------- Cellular or Tissue Based Product Details Patient Name: Date of Service: NA Colin Colin Halsted A. 02/18/2022 10:45 A M Medical Record Number: [id number] Patient Account Number: [id number] Date of Birth/Sex: Treating RN: 05-21-1956 (66 y.o. [name] Primary Care Provider: [name] Other Clinician: Referring Provider: Treating Provider/Extender: [name] in Treatment: 25 Cellular or Tissue Based Product Type Wound #1 Left,Medial Foot Applied to: Performed By: Physician [name], MD Cellular or Tissue Based Product Type: Apligraf Level of Consciousness (Pre-procedure): Awake and Alert Pre-procedure Verification/Time Out Yes - 11:32 Taken: Location: genitalia / hands / feet / multiple digits Wound Size (sq cm): 12.88 Product Size (sq cm): 44 Waste Size (sq cm): 4 Waste Reason: waste size Amount of Product Applied (sq cm): 40 Instrument Used: Forceps, Scissors Lot #: [id number] Expiration Date: 02/25/2022 Fenestrated: Yes Instrument: Blade Reconstituted: Yes Solution Type: normal saline Solution Amount: 6 ml Lot #:  [id number] Solution Expiration Date: 03/13/2022 Secured: Yes Secured With: Steri-Strips Dressing Applied: Yes Primary Dressing: adaptic Procedural Pain: 0 Post Procedural Pain: 0 Response to Treatment: Procedure was tolerated well Level of Consciousness (Post- Awake and Alert procedure): Post Procedure Diagnosis Same as Pre-procedure Notes scribed for Dr. Lady Gary by Samuella Bruin, RN Electronic Signature(s) Signed: 02/18/2022 5:20:05 PM By: Samuella Bruin Signed: 02/21/2022 8:20:12 AM By: [name] MD FACS Previous Signature: 02/18/2022 12:27:52 PM Version By: [name] MD FACS Entered By: Samuella Bruin on 02/18/2022 17:18:36 -------------------------------------------------------------------------------- Debridement Details Patient Name: Date of Service: NA Colin Colin Halsted A. 02/18/2022 10:45 A M Medical Record Number: [id number] Patient Account Number: [id number] Date of Birth/Sex: Treating RN: [birth year] (66 y.o. [name] Primary Care Provider: [name] Other Clinician: Referring Provider: Treating Provider/Extender: [name]: 25 Debridement Performed for Assessment: Wound #1 Left,Medial Foot Performed By: Physician [name], MD Debridement Type: Debridement Severity of Tissue Pre Debridement: Fat layer exposed Level of Consciousness (Pre-procedure): Awake and Alert Pre-procedure Verification/Time Out Yes - 11:29 Taken: Start Time: 11:29 T Area Debrided (L x W): otal 5.6 (cm) x 2.3 (cm) = 12.88 (cm) Tissue and other material debrided: Non-Viable, Slough, Biofilm, Slough Level: Non-Viable Tissue Debridement Description: Selective/Open Wound Instrument: Curette Bleeding: Minimum Hemostasis Achieved: Pressure Procedural Pain: 0 Post Procedural Pain: 0 Response to Treatment: Procedure was tolerated well Level of Consciousness (Post- Awake and Alert procedure): Post Debridement Measurements  of Total Wound Length: (cm) 5.6 Width: (cm) 2.3 Depth: (cm) 0.1 Volume: (cm) 1.012 Character of Wound/Ulcer Post Debridement: Improved Severity of Tissue Post Debridement: Fat layer exposed Post Procedure Diagnosis Same as Pre-procedure Notes scribed for Dr. Lady Gary by Samuella Bruin, RN Electronic Signature(s) Signed: 02/18/2022 5:20:05 PM By: Samuella Bruin Signed: 02/21/2022 8:20:12 AM By: [name] MD FACS Previous Signature: 02/18/2022 12:27:52 PM Version By: [name] MD FACS Entered By: Samuella Bruin on 02/18/2022 17:18:28 -------------------------------------------------------------------------------- HPI Details Patient Name:  Date of Service: NA Colin Nephew A. 02/18/2022 10:45 A M Medical Record Number: [id number] Patient Account Number: [id number] Date of Birth/Sex: Treating RN: Aug 15, 1955 (66 y.o. [name] Primary Care Provider: [name] Other Clinician: Referring Provider: Treating Provider/Extender: [name]: 25 History of Present Illness HPI Description: ADMISSION 08/25/2021 This is a 66 year old man who initially presented to his primary care provider in September 2022 with pain in his left foot. He was sent for an x-ray and while the x-ray was being performed, the tech pointed out a wound on his foot that the patient was not aware existed. He does have type 2 diabetes with significant neuropathy. His diabetes is suboptimally controlled with his most recent A1c being 8.5. He also has a history of coronary artery disease status post three- vessel CABG. he was initially seen by orthopedics, but they referred him to [facility]. He has undergone at least 7 operations/debridements and several applications of skin substitute under the care of podiatry. He has been in a wound VAC for much of this time. His most recent procedure was July 28, 2021. A portion of the talus was  biopsied and was found to be consistent with osteomyelitis. Culture also returned positive for corynebacterium. He was seen on August 16, 2021 by infectious disease. A PICC line has been placed and he will be receiving a 6-week course of IV daptomycin and cefepime. In October 2022, he underwent lower extremity vascular studies. Results are copied here: Right: Resting right ankle-brachial index is within normal range. No evidence of significant right lower extremity arterial disease. The right toe-brachial index is abnormal. Left: Resting left ankle-brachial index indicates mild left lower extremity arterial disease. The left toe-brachial index is abnormal. He has not been seen by vascular surgery despite these findings. He presented to clinic today in a cam boot and is using a knee scooter to offload. Wound VAC was in place. Once this was removed, a large ulcer was identified on the left midfoot/ankle. Bone is frankly exposed. There is no malodorous or purulent drainage. There is some granulation tissue over the central portion of the exposed bone. There is a tunnel that extends posteriorly for roughly 10 cm. It has been discussed with him by multiple providers that he is at very high risk of losing his lower leg because of this wound. He is extremely eager to avoid this outcome and is here today to review his options as well as receive ongoing wound care. 09/03/2021: Here for reevaluation of his wound. There does not appear to have been any substantial improvement overall since our last visit. He has been in a wound VAC with white foam overlying the exposed bone. We are working on getting him approved for hyperbaric oxygen therapy. 09/10/2021: We are in the process of getting him cleared to begin hyperbaric oxygen therapy. He still needs to obtain a chest x-ray. Although the wound measurements are roughly the same, I think the overall appearance of the wound is better. The exposed bone has a bit more  granulation tissue covering it. He has not received a vascular surgery appointment to reevaluate his flow to the wound. 09/17/2021: He has been approved for hyperbaric oxygen therapy and completed his chest x-ray, which I reviewed and it appears normal. The tunnels at the 12 and 10:00 positions are smaller. There is more granulation tissue covering the exposed bone and the undermining has decreased. He still has not received a  vascular surgery appointment. 09/24/2021: He initiated hyperbaric oxygen therapy this week and is tolerating it well. He has an appointment with vascular surgery coming up on May 16. The granulation tissue is covering more of the exposed bone and both tunnels are a bit smaller. 10/01/2021: He continues to tolerate hyperbaric oxygen therapy. He saw infectious disease and they are planning to pull his PICC line. He has been initiated on oral antibiotics (doxycycline and Augmentin). The wound looks about the same but the tunnels are a little bit smaller. The skin seems to be contracting somewhat around the exposed bone. 10/08/2021: The wound is still about the same size, but the tunnels continue to come in and the skin is contracting around the exposed bone. He continues to have some accumulation of necrotic material in the inferoposterior aspect of the wound as well as accumulation at the 12:00 tunnel area. 10/15/2021: The wound is smaller today. The tunnels continue to come in. There is less necrotic tissue present. He does have some periwound maceration. 10/22/2021: The wound is about the same size. There is a little bit less undermining at the distal portion. The exposed bone is dark and I am not sure if this is staining from silver nitrate or his VAC sponge or if it represents necrosis. The tunnels are shallower but he does have some serous drainage coming from the 10:00 tunnel. He continues to tolerate hyperbaric oxygen therapy well. 10/29/2021: The undermining continues to improve.  The tunnels are about the same. He has good granulation tissue overlying the majority of the exposed bone. It does appear that perhaps the tubing from his wound VAC has been eroding the skin at the 12 clock position. He continues to accumulate senescent epithelium around the borders of the wound. 11/05/2021: The undermining is almost completely resolved. The tunnels have contracted fairly significantly. No significant slough or debris accumulation. There is still senescent epithelium accumulation around the borders of the wound. He has been tolerating hyperbaric oxygen therapy well. 11/12/2021: Despite the measurements of the wound being about the same, the wound has changed in its shape and overall, I think it is improved. The undermining has resolved and the tunnels continue to shorten. There is good granulation tissue encroaching over the small area of bone that has remained exposed at the 12 o'clock position. Minimal slough accumulation. He continues to tolerate hyperbaric oxygen therapy well. 11/19/2021: I took a PCR culture last week. There was overgrowth of yeast. He is already taking suppressive doxycycline and Augmentin. I added fluconazole to his regimen. The wound is smaller again today. The tunnels continue to shorten. He continues to do well with hyperbaric oxygen therapy. 11/26/2021: For some reason, his foot has become macerated. The wound is narrower but about the same dimensions in its longitudinal aspect. The tunnels continue to shorten. He has some slough buildup on the wound as well as some heaped up senescent epithelium around the perimeter. 12/03/2021: No further maceration of his foot has occurred. The wound has contracted quite significantly from last week. The tunnel at 10:00 is closed. The tunnel at 12:00 is down to just a couple of millimeters. No other undermining is present. There is soft tissue coverage of the previously exposed bone. There is just a bit of slough and biofilm on  the wound surface. 12/10/2021: The wound is looking good. It turns out the tunnel at 12:00 is only exposed when the patient dorsiflexes his foot. It is about 2 cm in depth when he does this; when  his foot is in plantarflexion, the tunnel is closed. The bone that was visible at the 12:00 tunnel is completely covered with granulation tissue, but there does feel like some exposed bone deeper into the tunnel area. There is senescent skin heaped up around the periphery. Minimal slough on the wound surface. 12/16/2021: The wound dimensions are roughly the same. The surface has nice granulation tissue. The exposed bone at the 12:00 tunnel continues to be covered with more soft tissue. 7/14; patient's wound measures smaller today. Using the wound VAC with underlying collagen. He is also being treated with HBO for underlying osteomyelitis. He tells me he is on doxycycline and ampicillin follows with infectious disease next week 12/31/2021: The wound continues to contract. Unfortunately, the area where the track pad and tubing have been rubbing continues to look like it is applying friction. He says that the home health nurses that have been applying the [facility] have been putting gauze underneath the tubing, but nonetheless there is ongoing tissue breakdown at this site. Light slough accumulation on the wound surface. The tunnel continues to contract. He is tolerating HBO without difficulty. 01/07/2022: Bridging the wound VAC away from the ankle has resulted in significant improvement in the tissue at the apex of the wound. The tunnel is still present and is not all that much shorter, but the overall wound surface is very robust and healthy looking. Minimal slough accumulation. No concern for acute infection. 01/21/2022: The wound continues to contract and has a robust granulation tissue surface. The tunnel has come in considerably and is down to about 1.4 cm. There is still bone exposed within the tunnel but the rest  of it is well covered. There is some senescent epithelium at the wound margins and a little bit of slough on the surface. 01/28/2022: No significant change in the wound this week, but there has not been any reaccumulation of senescent epithelium or slough. The tunnel is perhaps a millimeter less in depth. He has been approved for Apligraf and we will apply this today. 02/04/2022: The wound has contracted somewhat and the tunnel has filled in completely. The wound surface is clean. He is here for Apligraf #2. 02/11/2022: The wound has contracted further and is now nearly flush with the surrounding skin surface. Light layer of slough. Apligraf #3 plan for today. 02/18/2022: There is a band of epithelium trying to cut across the superior portion of the wound. There is robust granulation tissue with just a light layer of slough and biofilm on the surface. There was a little bit of greenish drainage in the wound VAC but none appreciated on the site itself. He is here for Apligraf #4. Electronic Signature(s) Signed: 02/18/2022 11:51:19 AM By: [name] MD FACS Entered By: [name] on 02/18/2022 11:51:19 -------------------------------------------------------------------------------- Physical Exam Details Patient Name: Date of Service: NA Colin Baldy [name] 02/18/2022 10:45 A M Medical Record Number: [id number] Patient Account Number: [id number] Date of Birth/Sex: Treating RN: 1956-03-16 (66 y.o. [name] Primary Care Provider: [name] Other Clinician: Referring Provider: Treating Provider/Extender: [name] Weeks in Treatment: 25 Constitutional Hypertensive, asymptomatic. . . . No acute distress.Marland Kitchen Respiratory Normal work of breathing on room air.. Notes 02/18/2022: There is a band of epithelium trying to cut across the superior portion of the wound. There is robust granulation tissue with just a light layer of slough and biofilm on the surface. There was a  little bit of greenish drainage in the wound VAC but none  appreciated on the site itself. Electronic Signature(s) Signed: 02/18/2022 11:51:53 AM By: [name] MD FACS Entered By: [name] on 02/18/2022 11:51:53 -------------------------------------------------------------------------------- Physician Orders Details Patient Name: Date of Service: [name] 02/18/2022 10:45 A M Medical Record Number: [id number] Patient Account Number: [id number] Date of Birth/Sex: Treating RN: 1956-04-15 (66 y.o. 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 - Dr. Celine Ahr - Room 2 - 9/15 at 10:45 AM Return A Cellular or Tissue Based Products Wound #1 Left,Medial Foot Cellular or Tissue Based Product Type: - Apligraf #4 Cellular or Tissue Based Product applied to wound bed, secured with steri-strips, cover with Adaptic or Mepitel. (DO NOT REMOVE). Bathing/ Shower/ Hygiene May shower with protection but do not get wound dressing(s) wet. Negative Presssure Wound Therapy Black Foam Other: - WOUND VAC CONTINUOUSLY AT 100MM/HG PRESSURE Edema Control - Lymphedema / SCD / Other Elevate legs to the level of the heart or above for 30 minutes daily and/or when sitting, a frequency of: Hyperbaric Oxygen Therapy Evaluate for HBO Therapy Indication: - Wagner 3 diabetic ulcer left foot If appropriate for treatment,  begin HBOT per protocol: 2.5 ATA for 90 Minutes with 2 Five (5) Minute A Breaks ir Total Number of Treatments: - 12/31/2021 additional 40 treatments. T of 120 otal One treatments per day (delivered Monday through Friday unless otherwise specified in Special Instructions below): Finger stick Blood Glucose Pre- and Post- HBOT Treatment. Follow Hyperbaric Oxygen Glycemia Protocol A frin (Oxymetazoline HCL) 0.05% nasal spray - 1 spray in both nostrils daily as needed prior to HBO treatment for difficulty clearing ears Wound Treatment Wound #1 - Foot Wound Laterality: Left, Medial Cleanser: Wound Cleanser 1 x Per Week/30 Days Discharge Instructions: Cleanse the wound with wound cleanser prior to applying a clean dressing using gauze sponges, not tissue or cotton balls. Prim Dressing: Apligraf 1 x Per Week/30 Days ary Discharge Instructions: DO NOT REMOVE Secondary Dressing: Woven Gauze Sponge, Non-Sterile 4x4 in 1 x Per Week/30 Days Discharge Instructions: In clinic. Home Health to apply wound vac. Secured With: Elastic Bandage 4 inch (ACE bandage) 1 x Per Week/30 Days Discharge Instructions: Secure with ACE bandage as directed. Secured With: [employer], 4.5x3.1 (in/yd) 1 x Per Week/30 Days Discharge Instructions: In clinic. Home Health to apply wound vac. Secured With: 91M Medipore H Soft Cloth Surgical T ape, 4 x 10 (in/yd) 1 x Per Week/30 Days Discharge Instructions: In clinic. Home Health to apply wound vac. GLYCEMIA INTERVENTIONS PROTOCOL PRE-HBO GLYCEMIA INTERVENTIONS ACTION INTERVENTION Obtain pre-HBO capillary blood glucose (ensure 1 physician order is in chart). A. Notify HBO physician and await physician orders. 2 If result is 70 mg/dl or below: B. If the result meets the hospital definition of a critical result, follow hospital policy. A. Give patient an 8 ounce Glucerna Shake, an 8 ounce Ensure, or 8 ounces of a Glucerna/Ensure equivalent  dietary supplement*. B. Wait 30 minutes. If result is 71 mg/dl to 130 mg/dl: C. Retest patients capillary blood glucose (CBG). D. If result greater than  or equal to 110 mg/dl, proceed with HBO. If result less than 110 mg/dl, notify HBO physician and consider holding HBO. If result is 131 mg/dl to 249 mg/dl: A. Proceed with HBO. A. Notify HBO physician and await physician orders. B. It is recommended to hold HBO and do If result is 250 mg/dl or greater: blood/urine ketone testing. C. If the result meets the hospital definition of a critical result, follow hospital policy. POST-HBO GLYCEMIA INTERVENTIONS ACTION INTERVENTION Obtain post HBO capillary blood glucose (ensure 1 physician order is in chart). A. Notify HBO physician and await physician orders. 2 If result is 70 mg/dl or below: B. If the result meets the hospital definition of a critical result, follow hospital policy. A. Give patient an 8 ounce Glucerna Shake, an 8 ounce Ensure, or 8 ounces of a Glucerna/Ensure equivalent dietary supplement*. B. Wait 15 minutes for symptoms of If result is 71 mg/dl to 100 mg/dl: hypoglycemia (i.e. nervousness, anxiety, sweating, chills, clamminess, irritability, confusion, tachycardia or dizziness). C. If patient asymptomatic, discharge patient. If patient symptomatic, repeat capillary blood glucose (CBG) and notify HBO physician. If result is 101 mg/dl to 249 mg/dl: A. Discharge patient. A. Notify HBO physician and await physician orders. B. It is recommended to do blood/urine ketone If result is 250 mg/dl or greater: testing. C. If the result meets the hospital definition of a critical result, follow hospital policy. *Juice or candies are NOT equivalent products. If patient refuses the Glucerna or Ensure, please consult the hospital dietitian for an appropriate substitute. Electronic Signature(s) Signed: 02/18/2022 12:27:52 PM By: [name] MD FACS Entered By:  [name] on 02/18/2022 11:52:11 -------------------------------------------------------------------------------- Problem List Details Patient Name: Date of Service: [name] 02/18/2022 10:45 A M Medical Record Number: [id number] Patient Account Number: [id number] Date of Birth/Sex: Treating RN: [birth year] (66 y.o. 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 02/18/2022 10:45 A M Medical Record Number: [id number] Patient Account Number: [id number] Date of Birth/Sex: Treating RN: 22-Dec-1955 (66 y.o. [name] Primary Care Provider: [name] Other Clinician: Referring Provider: Treating Provider/Extender: [name] in Treatment: 25 Subjective Chief Complaint Information obtained from Patient Patients presents for treatment of an open diabetic ulcer with exposed bone and osteomyelitis History of Present Illness (HPI) ADMISSION 08/25/2021 This is a 66 year old man who initially presented to his  primary care provider in September 2022 with pain in his left foot. He was sent for an x-ray and while the x-ray was being performed, the tech pointed out a wound on his foot that the patient was not aware existed. He does have type 2 diabetes with significant neuropathy. His diabetes is suboptimally controlled with his most recent A1c being 8.5. He also has a history of coronary artery disease  status post three- vessel CABG. he was initially seen by orthopedics, but they referred him to [facility]. He has undergone at least 7 operations/debridements and several applications of skin substitute under the care of podiatry. He has been in a wound VAC for much of this time. His most recent procedure was July 28, 2021. A portion of the talus was biopsied and was found to be consistent with osteomyelitis. Culture also returned positive for corynebacterium. He was seen on August 16, 2021 by infectious disease. A PICC line has been placed and he will be receiving a 6-week course of IV daptomycin and cefepime. In October 2022, he underwent lower extremity vascular studies. Results are copied here: Right: Resting right ankle-brachial index is within normal range. No evidence of significant right lower extremity arterial disease. The right toe-brachial index is abnormal. Left: Resting left ankle-brachial index indicates mild left lower extremity arterial disease. The left toe-brachial index is abnormal. He has not been seen by vascular surgery despite these findings. He presented to clinic today in a cam boot and is using a knee scooter to offload. Wound VAC was in place. Once this was removed, a large ulcer was identified on the left midfoot/ankle. Bone is frankly exposed. There is no malodorous or purulent drainage. There is some granulation tissue over the central portion of the exposed bone. There is a tunnel that extends posteriorly for roughly 10 cm. It has been discussed with him by  multiple providers that he is at very high risk of losing his lower leg because of this wound. He is extremely eager to avoid this outcome and is here today to review his options as well as receive ongoing wound care. 09/03/2021: Here for reevaluation of his wound. There does not appear to have been any substantial improvement overall since our last visit. He has been in a wound VAC with white foam overlying the exposed bone. We are working on getting him approved for hyperbaric oxygen therapy. 09/10/2021: We are in the process of getting him cleared to begin hyperbaric oxygen therapy. He still needs to obtain a chest x-ray. Although the wound measurements are roughly the same, I think the overall appearance of the wound is better. The exposed bone has a bit more granulation tissue covering it. He has not received a vascular surgery appointment to reevaluate his flow to the wound. 09/17/2021: He has been approved for hyperbaric oxygen therapy and completed his chest x-ray, which I reviewed and it appears normal. The tunnels at the 12 and 10:00 positions are smaller. There is more granulation tissue covering the exposed bone and the undermining has decreased. He still has not received a vascular surgery appointment. 09/24/2021: He initiated hyperbaric oxygen therapy this week and is tolerating it well. He has an appointment with vascular surgery coming up on May 16. The granulation tissue is covering more of the exposed bone and both tunnels are a bit smaller. 10/01/2021: He continues to tolerate hyperbaric oxygen therapy. He saw infectious disease and they are planning to pull his PICC line. He has been initiated on oral antibiotics (doxycycline and Augmentin). The wound looks about the same but the tunnels are a little bit smaller. The skin seems to be contracting somewhat around the exposed bone. 10/08/2021: The wound is still about the same size, but the tunnels continue to come in and the skin is  contracting around the exposed bone. He continues to have some accumulation of necrotic material in the inferoposterior aspect of  the wound as well as accumulation at the 12:00 tunnel area. 10/15/2021: The wound is smaller today. The tunnels continue to come in. There is less necrotic tissue present. He does have some periwound maceration. 10/22/2021: The wound is about the same size. There is a little bit less undermining at the distal portion. The exposed bone is dark and I am not sure if this is staining from silver nitrate or his VAC sponge or if it represents necrosis. The tunnels are shallower but he does have some serous drainage coming from the 10:00 tunnel. He continues to tolerate hyperbaric oxygen therapy well. 10/29/2021: The undermining continues to improve. The tunnels are about the same. He has good granulation tissue overlying the majority of the exposed bone. It does appear that perhaps the tubing from his wound VAC has been eroding the skin at the 12 clock position. He continues to accumulate senescent epithelium around the borders of the wound. 11/05/2021: The undermining is almost completely resolved. The tunnels have contracted fairly significantly. No significant slough or debris accumulation. There is still senescent epithelium accumulation around the borders of the wound. He has been tolerating hyperbaric oxygen therapy well. 11/12/2021: Despite the measurements of the wound being about the same, the wound has changed in its shape and overall, I think it is improved. The undermining has resolved and the tunnels continue to shorten. There is good granulation tissue encroaching over the small area of bone that has remained exposed at the 12 o'clock position. Minimal slough accumulation. He continues to tolerate hyperbaric oxygen therapy well. 11/19/2021: I took a PCR culture last week. There was overgrowth of yeast. He is already taking suppressive doxycycline and Augmentin. I added  fluconazole to his regimen. The wound is smaller again today. The tunnels continue to shorten. He continues to do well with hyperbaric oxygen therapy. 11/26/2021: For some reason, his foot has become macerated. The wound is narrower but about the same dimensions in its longitudinal aspect. The tunnels continue to shorten. He has some slough buildup on the wound as well as some heaped up senescent epithelium around the perimeter. 12/03/2021: No further maceration of his foot has occurred. The wound has contracted quite significantly from last week. The tunnel at 10:00 is closed. The tunnel at 12:00 is down to just a couple of millimeters. No other undermining is present. There is soft tissue coverage of the previously exposed bone. There is just a bit of slough and biofilm on the wound surface. 12/10/2021: The wound is looking good. It turns out the tunnel at 12:00 is only exposed when the patient dorsiflexes his foot. It is about 2 cm in depth when he does this; when his foot is in plantarflexion, the tunnel is closed. The bone that was visible at the 12:00 tunnel is completely covered with granulation tissue, but there does feel like some exposed bone deeper into the tunnel area. There is senescent skin heaped up around the periphery. Minimal slough on the wound surface. 12/16/2021: The wound dimensions are roughly the same. The surface has nice granulation tissue. The exposed bone at the 12:00 tunnel continues to be covered with more soft tissue. 7/14; patient's wound measures smaller today. Using the wound VAC with underlying collagen. He is also being treated with HBO for underlying osteomyelitis. He tells me he is on doxycycline and ampicillin follows with infectious disease next week 12/31/2021: The wound continues to contract. Unfortunately, the area where the track pad and tubing have been rubbing continues to look like  it is applying friction. He says that the home health nurses that have been  applying the [facility] have been putting gauze underneath the tubing, but nonetheless there is ongoing tissue breakdown at this site. Light slough accumulation on the wound surface. The tunnel continues to contract. He is tolerating HBO without difficulty. 01/07/2022: Bridging the wound VAC away from the ankle has resulted in significant improvement in the tissue at the apex of the wound. The tunnel is still present and is not all that much shorter, but the overall wound surface is very robust and healthy looking. Minimal slough accumulation. No concern for acute infection. 01/21/2022: The wound continues to contract and has a robust granulation tissue surface. The tunnel has come in considerably and is down to about 1.4 cm. There is still bone exposed within the tunnel but the rest of it is well covered. There is some senescent epithelium at the wound margins and a little bit of slough on the surface. 01/28/2022: No significant change in the wound this week, but there has not been any reaccumulation of senescent epithelium or slough. The tunnel is perhaps a millimeter less in depth. He has been approved for Apligraf and we will apply this today. 02/04/2022: The wound has contracted somewhat and the tunnel has filled in completely. The wound surface is clean. He is here for Apligraf #2. 02/11/2022: The wound has contracted further and is now nearly flush with the surrounding skin surface. Light layer of slough. Apligraf #3 plan for today. 02/18/2022: There is a band of epithelium trying to cut across the superior portion of the wound. There is robust granulation tissue with just a light layer of slough and biofilm on the surface. There was a little bit of greenish drainage in the wound VAC but none appreciated on the site itself. He is here for Apligraf #4. Patient History Information obtained from Patient. Family History Cancer - Father, Diabetes - Father,Mother,Paternal Grandparents, Heart Disease - Father,  Hypertension - Father, No family history of Hereditary Spherocytosis, Kidney Disease, Lung Disease, Seizures, Stroke, Thyroid Problems, Tuberculosis. Social History Never smoker, Marital Status - Married, Alcohol Use - Rarely, Drug Use - No History, Caffeine Use - Daily. Medical History Eyes Patient has history of Cataracts - Removed 2008 Cardiovascular Patient has history of Coronary Artery Disease, Hypertension, Myocardial Infarction, Peripheral Arterial Disease Endocrine Patient has history of Type II Diabetes Musculoskeletal Patient has history of Osteomyelitis Neurologic Patient has history of Neuropathy Medical A Surgical History Notes nd Cardiovascular Hypercholesterolemia Abnormal EKG CABG X3 2019 Gastrointestinal GERD Musculoskeletal Diabetic foot ulcer Objective Constitutional Hypertensive, asymptomatic. No acute distress.. Vitals Time Taken: 11:12 AM, Height: 74 in, Weight: 186 lbs, BMI: 23.9, Temperature: 97.8 F, Pulse: 69 bpm, Respiratory Rate: 16 breaths/min, Blood Pressure: 165/87 mmHg, Capillary Blood Glucose: 213 mg/dl. Respiratory Normal work of breathing on room air.. General Notes: 02/18/2022: There is a band of epithelium trying to cut across the superior portion of the wound. There is robust granulation tissue with just a light layer of slough and biofilm on the surface. There was a little bit of greenish drainage in the wound VAC but none appreciated on the site itself. Integumentary (Hair, Skin) Wound #1 status is Open. Original cause of wound was Gradually Appeared. The date acquired was: 02/22/2021. The wound has been in treatment 25 weeks. The wound is located on the Left,Medial Foot. The wound measures 5.6cm length x 2.3cm width x 0.1cm depth; 10.116cm^2 area and 1.012cm^3 volume. There is Fat Layer (Subcutaneous Tissue)  exposed. There is no tunneling or undermining noted. There is a medium amount of serosanguineous drainage noted. The wound margin is  distinct with the outline attached to the wound base. There is large (67-100%) red, hyper - granulation within the wound bed. There is a small (1- 33%) amount of necrotic tissue within the wound bed including Adherent Slough. 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 There was a Selective/Open Wound Non-Viable Tissue Debridement with a total area of 12.88 sq cm performed by [name], MD. With the following instrument(s): Curette to remove Non-Viable tissue/material. Material removed includes Slough and Biofilm and. No specimens were taken. A time out was conducted at 11:29, prior to the start of the procedure. A Minimum amount of bleeding was controlled with Pressure. The procedure was tolerated well with a pain level of 0 throughout and a pain level of 0 following the procedure. Post Debridement Measurements: 5.6cm length x 2.3cm width x 0.1cm depth; 1.012cm^3 volume. Character of Wound/Ulcer Post Debridement is improved. Severity of Tissue Post Debridement is: Fat layer exposed. Post procedure Diagnosis Wound #1: Same as Pre-Procedure Pre-procedure diagnosis of Wound #1 is a Diabetic Wound/Ulcer of the Lower Extremity located on the Left,Medial Foot. A skin graft procedure using a bioengineered skin substitute/cellular or tissue based product was performed by [name], MD with the following instrument(s): Forceps and Scissors. Apligraf was applied and secured with Steri-Strips. 40 sq cm of product was  utilized and 4 sq cm was wasted due to waste size. Post Application, adaptic was applied. A Time Out was conducted at 11:32, prior to the start of the procedure. The procedure was tolerated well with a pain level of 0 throughout and a pain level of 0 following the procedure. Post procedure Diagnosis Wound #1: Same as Pre-Procedure . Plan Follow-up Appointments: Return Appointment in 1 week. - Dr. Lady Gary - Room 2 - 9/15 at 10:45 AM Cellular or Tissue Based Products: Wound #1 Left,Medial Foot: Cellular or Tissue Based Product Type: - Apligraf #4 Cellular or Tissue Based Product applied to wound bed, secured with steri-strips, cover with Adaptic or Mepitel. (DO NOT REMOVE). Bathing/ Shower/ Hygiene: May shower with protection but do not get wound dressing(s) wet. Negative Presssure Wound Therapy: Black Foam Other: - WOUND VAC CONTINUOUSLY AT 100MM/HG PRESSURE Edema Control - Lymphedema / SCD / Other: Elevate legs to the level of the heart or above for 30 minutes daily and/or when sitting, a frequency of: Hyperbaric Oxygen Therapy: Evaluate for HBO Therapy Indication: - Wagner 3 diabetic ulcer left foot If appropriate for treatment, begin HBOT per protocol: 2.5 ATA for 90 Minutes with 2 Five (5) Minute Air Breaks T Number of Treatments: - 12/31/2021 additional 40 treatments. T of 120 otal otal One treatments per day (delivered Monday through Friday unless otherwise specified in Special Instructions below): Finger stick Blood Glucose Pre- and Post- HBOT Treatment. Follow Hyperbaric Oxygen Glycemia Protocol Afrin (Oxymetazoline HCL) 0.05% nasal spray - 1 spray in both nostrils daily as needed prior to HBO treatment for difficulty clearing ears WOUND #1: - Foot Wound Laterality: Left, Medial Cleanser: Wound  Cleanser 1 x Per Week/30 Days Discharge Instructions: Cleanse the wound with wound cleanser prior to applying a clean dressing using gauze sponges, not tissue or cotton balls. Prim  Dressing: Apligraf 1 x Per Week/30 Days ary Discharge Instructions: DO NOT REMOVE Secondary Dressing: Woven Gauze Sponge, Non-Sterile 4x4 in 1 x Per Week/30 Days Discharge Instructions: In clinic. Home Health to apply wound vac. Secured With: Elastic Bandage 4 inch (ACE bandage) 1 x Per Week/30 Days Discharge Instructions: Secure with ACE bandage as directed. Secured With: [employer], 4.5x3.1 (in/yd) 1 x Per Week/30 Days Discharge Instructions: In clinic. Home Health to apply wound vac. Secured With: 53M Medipore H Soft Cloth Surgical T ape, 4 x 10 (in/yd) 1 x Per Week/30 Days Discharge Instructions: In clinic. Home Health to apply wound vac. 02/18/2022: There is a band of epithelium trying to cut across the superior portion of the wound. There is robust granulation tissue with just a light layer of slough and biofilm on the surface. There was a little bit of greenish drainage in the wound VAC but none appreciated on the site itself. I used a curette to debride slough and biofilm from the wound surface. I then fenestrated a piece of Apligraf and applied it in standard fashion, securing it with Adaptic and Steri-Strips. Because of the greenish drainage, I did put a thin layer of gentamicin ointment over the Adaptic. We will continue his negative pressure wound therapy and hyperbaric oxygen. Follow-up in 1 week. Electronic Signature(s) Signed: 02/18/2022 11:52:49 AM By: [name] MD FACS Entered By: [name] on 02/18/2022 11:52:49 -------------------------------------------------------------------------------- HxROS Details Patient Name: Date of Service: NA [name], [name] 02/18/2022 10:45 A M Medical Record Number: [id number] Patient Account Number: [id number] Date of Birth/Sex: Treating RN: June 14, 1955 (66 y.o. [name] Primary Care Provider: [name] Other Clinician: Referring Provider: Treating Provider/Extender: [name] Weeks in  Treatment: 25 Information Obtained From Patient Eyes Medical History: Positive for: Cataracts - Removed 2008 Cardiovascular Medical History: Positive for: Coronary Artery Disease; Hypertension; Myocardial Infarction; Peripheral Arterial Disease Past Medical History Notes: Hypercholesterolemia Abnormal EKG CABG X3 2019 Gastrointestinal Medical History: Past Medical History Notes: GERD Endocrine Medical History: Positive for: Type II Diabetes Time with diabetes: 24 years Treated with: Insulin, Oral agents Blood sugar tested every day: Yes Tested : Musculoskeletal Medical History: Positive for: Osteomyelitis Past Medical History Notes: Diabetic foot ulcer Neurologic Medical History: Positive for: Neuropathy HBO Extended History Items Eyes: Cataracts Immunizations Pneumococcal Vaccine: Received Pneumococcal Vaccination: Yes Received Pneumococcal Vaccination On or After 60th Birthday: Yes Implantable Devices Yes Family and Social History Cancer: Yes - Father; Diabetes: Yes - Father,Mother,Paternal Grandparents; Heart Disease: Yes - Father; Hereditary Spherocytosis: No; Hypertension: Yes - Father; Kidney Disease: No; Lung Disease: No; Seizures: No; Stroke: No; Thyroid Problems: No; Tuberculosis: No; Never smoker; Marital Status - Married; Alcohol Use: Rarely; Drug Use: No History; Caffeine Use: Daily; Financial Concerns: No; Food, Clothing or Shelter Needs: No; Support System Lacking: No; Transportation Concerns: No Electronic Signature(s) Signed: 02/18/2022 12:27:52 PM By: [name] MD FACS Signed: 02/18/2022 5:20:05 PM By: Adline Peals Entered By: [name] on 02/18/2022 11:51:25 -------------------------------------------------------------------------------- SuperBill Details Patient Name: Date of Service: NA Colin [name] 02/18/2022 Medical Record Number: [id number] Patient Account Number: [id number] Date of Birth/Sex: Treating RN: May 29, 1956  (66 y.o. 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 Facility Procedures CPT4 Code: [id number] Description: (Facility Use Only) Apligraf 1 SQ CM Modifier: Quantity: 63 CPT4 Code: [id number] Description: [id number] - SKIN SUB GRAFT FACE/NK/HF/G ICD-10 Diagnosis Description L97.324 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 Modifier: Quantity: 1 Physician Procedures : CPT4 Code Description Modifier [id number] - WC PHYS LEVEL 4 - EST PT 25 ICD-10 Diagnosis Description L97.324 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 E11.621 Type 2 diabetes mellitus with foot ulcer M86.672 Other chronic  osteomyelitis, left ankle and foot E11.610 Type 2 diabetes mellitus with diabetic neuropathic arthropathy Quantity: 1 : [id number] - WC PHYS SKIN SUB GRAFT FACE/NK/HF/G ICD-10 Diagnosis Description L97.324 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

## 2022-02-21 NOTE — Progress Notes (Signed)
TIMOHTY, RENBARGER ([id number]) Visit Report for 02/21/2022 Arrival Information Details Patient Name: Date of Service: NA [name] 02/21/2022 8:00 A M Medical Record Number: [id number] Patient Account Number: [id number] Date of Birth/Sex: Treating RN: 03-Jun-1956 (66 y.o. [name] Primary Care Jeslyn Amsler: [name] Other Clinician: [name] Referring Jesse Hirst: Treating Twylah Bennetts/Extender: Gayleen Orem Weeks in Treatment: 25 Visit Information History Since Last Visit All ordered tests and consults were completed: Yes Patient Arrived: Knee Scooter Added or deleted any medications: No Arrival Time: 08:40 Any new allergies or adverse reactions: No Accompanied By: Wife Had a fall or experienced change in No Transfer Assistance: None activities of daily living that may affect Patient Identification Verified: Yes risk of falls: Secondary Verification Process Completed: Yes Signs or symptoms of abuse/neglect since last visito No Patient Requires Transmission-Based Precautions: No Hospitalized since last visit: No Patient Has Alerts: Yes Implantable device outside of the clinic excluding No Patient Alerts: Patient on Blood Thinner cellular tissue based products placed in the center since last visit: Pain Present Now: No Electronic Signature(s) Signed: 02/21/2022 2:32:52 PM By: [name] EMT Entered By: [name] on 02/21/2022 14:32:52 -------------------------------------------------------------------------------- Encounter Discharge Information Details Patient Name: Date of Service: NA [name] 02/21/2022 8:00 A M Medical Record Number: [id number] Patient Account Number: [id number] Date of Birth/Sex: Treating RN: [birth year] (66 y.o. [name] Primary Care Shataya Winkles: [name] Other Clinician: [name] Referring Jamara Vary: Treating Granger Chui/Extender: Gayleen Orem Weeks in Treatment: 25 Encounter Discharge  Information Items Discharge Condition: Stable Ambulatory Status: Knee Scooter Discharge Destination: Home Transportation: Private Auto Accompanied By: Wife Schedule Follow-up Appointment: Yes Clinical Summary of Care: Electronic Signature(s) Signed: 02/21/2022 2:38:11 PM By: [name] EMT Entered By: [name] on 02/21/2022 14:38:11 -------------------------------------------------------------------------------- Vitals Details Patient Name: Date of Service: NA [name], [name] 02/21/2022 8:00 A M Medical Record Number: [id number] Patient Account Number: [id number] Date of Birth/Sex: Treating RN: 1955-10-13 (66 y.o. [name] Primary Care Keaton Stirewalt: [name] Other Clinician: [name] Referring Midori Dado: Treating Dannica Bickham/Extender: Gayleen Orem Weeks in Treatment: 25 Vital Signs Time Taken: 08:50 Temperature (F): 97.7 Height (in): 74 Pulse (bpm): 90 Weight (lbs): 186 Respiratory Rate (breaths/min): 16 Body Mass Index (BMI): 23.9 Blood Pressure (mmHg): 138/81 Capillary Blood Glucose (mg/dl): 539 Reference Range: 80 - 120 mg / dl Electronic Signature(s) Signed: 02/21/2022 2:34:01 PM By: [name] EMT Entered By: [name] on 02/21/2022 14:34:01

## 2022-02-21 NOTE — Progress Notes (Signed)
MEHMET, SCALLY ([id number]) Visit Report for 02/21/2022 HBO Safety Checklist Details Patient Name: Date of Service: NA [name] 02/21/2022 8:00 A M Medical Record Number: [id number] Patient Account Number: [id number] Date of Birth/Sex: Treating RN: May 29, 1956 (66 y.o. [name] Primary Care Rameses Ou: [name] Other Clinician: [name] Referring Takako Minckler: Treating Viviene Thurston/Extender: Gayleen Orem Weeks in Treatment: 25 HBO Safety Checklist Items Safety Checklist Consent Form Signed Patient voided / foley secured and emptied When did you last eato 0645 Last dose of injectable or oral agent 0645 Ostomy pouch emptied and vented if applicable NA All implantable devices assessed, documented and approved NA Intravenous access site secured and place NA Valuables secured Linens and cotton and cotton/polyester blend (less than 51% polyester) Personal oil-based products / skin lotions / body lotions removed Wigs or hairpieces removed NA Smoking or tobacco materials removed Books / newspapers / magazines / loose paper removed Cologne, aftershave, perfume and deodorant removed Jewelry removed (may wrap wedding band) NA Make-up removed NA Hair care products removed Battery operated devices (external) removed Heating patches and chemical warmers removed Titanium eyewear removed NA Nail polish cured greater than 10 hours NA Casting material cured greater than 10 hours NA Hearing aids removed NA Loose dentures or partials removed NA Prosthetics have been removed NA Patient demonstrates correct use of air break device (if applicable) Patient concerns have been addressed Patient grounding bracelet on and cord attached to chamber Specifics for Inpatients (complete in addition to above) Medication sheet sent with patient NA Intravenous medications needed or due during therapy sent with patient NA Drainage tubes (e.g. nasogastric tube or  chest tube secured and vented) NA Endotracheal or Tracheotomy tube secured NA Cuff deflated of air and inflated with saline NA Airway suctioned NA Notes The safety checklist was done before the treatment was started. Electronic Signature(s) Signed: 02/21/2022 2:35:12 PM By: [name] EMT Entered By: [name] on 02/21/2022 14:35:12

## 2022-02-22 ENCOUNTER — Encounter (HOSPITAL_BASED_OUTPATIENT_CLINIC_OR_DEPARTMENT_OTHER): Payer: BC Managed Care – PPO | Admitting: General Surgery

## 2022-02-22 DIAGNOSIS — I2510 Atherosclerotic heart disease of native coronary artery without angina pectoris: Secondary | ICD-10-CM | POA: Diagnosis not present

## 2022-02-22 DIAGNOSIS — E11610 Type 2 diabetes mellitus with diabetic neuropathic arthropathy: Secondary | ICD-10-CM | POA: Diagnosis not present

## 2022-02-22 DIAGNOSIS — M86672 Other chronic osteomyelitis, left ankle and foot: Secondary | ICD-10-CM | POA: Diagnosis not present

## 2022-02-22 DIAGNOSIS — L97324 Non-pressure chronic ulcer of left ankle with necrosis of bone: Secondary | ICD-10-CM | POA: Diagnosis not present

## 2022-02-22 DIAGNOSIS — E11621 Type 2 diabetes mellitus with foot ulcer: Secondary | ICD-10-CM | POA: Diagnosis not present

## 2022-02-22 DIAGNOSIS — T8131XA Disruption of external operation (surgical) wound, not elsewhere classified, initial encounter: Secondary | ICD-10-CM | POA: Diagnosis not present

## 2022-02-22 LAB — GLUCOSE, CAPILLARY
Glucose-Capillary: 171 mg/dL — ABNORMAL HIGH (ref 70–99)
Glucose-Capillary: 157 mg/dL — ABNORMAL HIGH (ref 70–99)

## 2022-02-22 NOTE — Progress Notes (Addendum)
[name], [name] ([id number]) Visit Report for 02/22/2022 HBO Details Patient Name: Date of Service: NA [name]. 02/22/2022 8:00 [name] Medical Record Number: [id number] Patient Account Number: [id number] Date of Birth/Sex: Treating RN: July 21, 1955 (66 y.o. [name] Primary Care Kendy Haston: [name] Other Clinician: [name] Referring Solymar Grace: Treating Alanya Vukelich/Extender: [name]: 25 HBO Treatment Course Details Treatment Course Number: 1 Ordering Charlton Boule: [name] T Treatments Ordered: otal 120 HBO Treatment Start Date: 09/20/2021 HBO Indication: Diabetic Ulcer(s) of the Lower Extremity Standard/Conservative Wound Care tried and failed greater than or equal to 30 days HBO Treatment Details Treatment Number: 99 Patient Type: Outpatient Chamber Type: Monoplace Chamber Serial #: [id number] Treatment Protocol: 2.0 ATA with 90 minutes oxygen, with two 5 minute air breaks Treatment Details Compression Rate Down: 2.0 psi / minute De-Compression Rate Up: 2.0 psi / minute [name] breaks and breathing ir Compress Tx Pressure periods Decompress Decompress Begins Reached (leave unused spaces Begins Ends blank) Chamber Pressure (ATA 1 2 2 2 2 2  --2 1 ) Clock Time (24 hr) 08:47 08:58 09:28 09:33 10:03 10:08 - - 10:38 10:47 Treatment Length: 120 (minutes) Treatment Segments: 4 Vital Signs Capillary Blood Glucose Reference Range: 80 - 120 mg / dl HBO Diabetic Blood Glucose Intervention Range: <131 mg/dl or mg/dl Time Vitals Blood Respiratory Capillary Blood Glucose Pulse Action Type: Pulse: Temperature: Taken: Pressure: Rate: Glucose (mg/dl): Meter #: Oximetry (%) Taken: Pre 08:41 139/88 84 16 97.7 171 Post 10:52 154/86 74 18 97.5 157 Treatment Response Treatment Toleration: Well Treatment Completion Status: Treatment Completed without Adverse Event Additional Procedure Documentation Tissue Sevierity: Necrosis of  bone Physician HBO Attestation: I certify that I supervised this HBO treatment in accordance with Medicare guidelines. [name] trained emergency response team is readily available per Yes hospital policies and procedures. Continue HBOT as ordered. Yes Electronic Signature(s) Signed: 02/22/2022 2:33:10 PM By: 04/24/2022 MD FACS Previous Signature: 02/22/2022 2:32:43 PM Version By: 04/24/2022 MD FACS Previous Signature: 02/22/2022 1:55:20 PM Version By: 04/24/2022 EMT Entered By: [name] on 02/22/2022 14:33:09 -------------------------------------------------------------------------------- HBO Safety Checklist Details Patient Name: Date of Service: 04/24/2022 N [name]. 02/22/2022 8:00 [name] Medical Record Number: 04/24/2022 Patient Account Number: [id number] Date of Birth/Sex: Treating RN: 1955/10/11 (66 y.o. 77 Primary Care Suzannah Bettes: [name] Other Clinician: [name] Referring Waylon Koffler: Treating Collyn Selk/Extender: [name]: 25 HBO Safety Checklist Items Safety Checklist Consent Form Signed Patient voided / foley secured and emptied When did you last eato 0615 Last dose of injectable or oral agent 0615 Ostomy pouch emptied and vented if applicable NA All implantable devices assessed, documented and approved NA Intravenous access site secured and place NA Valuables secured Linens and cotton and cotton/polyester blend (less than 51% polyester) Personal oil-based products / skin lotions / body lotions removed Wigs or hairpieces removed NA Smoking or tobacco materials removed Books / newspapers / magazines / loose paper removed Cologne, aftershave, perfume and deodorant removed Jewelry removed (may wrap wedding band) NA Make-up removed NA Hair care products removed Battery operated devices (external) removed Heating patches and chemical warmers removed Titanium eyewear removed NA Nail polish  cured greater than 10 hours NA Casting material cured greater than 10 hours NA Hearing aids removed NA Loose dentures or partials removed NA Prosthetics have been removed NA Patient demonstrates correct use of air break device (if applicable) Patient concerns have been addressed Patient grounding  bracelet on and cord attached to chamber Specifics for Inpatients (complete in addition to above) Medication sheet sent with patient NA Intravenous medications needed or due during therapy sent with patient NA Drainage tubes (e.g. nasogastric tube or chest tube secured and vented) NA Endotracheal or Tracheotomy tube secured NA Cuff deflated of air and inflated with saline NA Airway suctioned NA Notes The safety checklist was done before the treatment was started. Electronic Signature(s) Signed: 02/22/2022 1:53:57 PM By: [name] EMT Entered By: [name] on 02/22/2022 13:53:56

## 2022-02-22 NOTE — Progress Notes (Signed)
[name], [name] ([id number]) Visit Report for 02/22/2022 Arrival Information Details Patient Name: Date of Service: NA Colin Nephew A. 02/22/2022 8:00 A M Medical Record Number: [id number] Patient Account Number: [id number] Date of Birth/Sex: Treating RN: 09/17/1955 (66 y.o. [name] Primary Care [name]: [name] Other Clinician: [name] Referring [name]: Treating [name]/Extender: [name] in Treatment: 25 Visit Information History Since Last Visit All ordered tests and consults were completed: Yes Patient Arrived: Knee Scooter Added or deleted any medications: No Arrival Time: 08:26 Any new allergies or adverse reactions: No Accompanied By: Wife Had a fall or experienced change in No Transfer Assistance: None activities of daily living that may affect Patient Identification Verified: Yes risk of falls: Secondary Verification Process Completed: Yes Signs or symptoms of abuse/neglect since last visito No Patient Requires Transmission-Based Precautions: No Hospitalized since last visit: No Patient Has Alerts: Yes Implantable device outside of the clinic excluding No Patient Alerts: Patient on Blood Thinner cellular tissue based products placed in the center since last visit: Pain Present Now: No Electronic Signature(s) Signed: 02/22/2022 1:52:16 PM By: [name] EMT Entered By: [name] on 02/22/2022 13:52:16 -------------------------------------------------------------------------------- Encounter Discharge Information Details Patient Name: Date of Service: NA Colin [name] 02/22/2022 8:00 A M Medical Record Number: [id number] Patient Account Number: [id number] Date of Birth/Sex: Treating RN: Aug 09, 1955 (66 y.o. [name] Primary Care [name]: [name] Other Clinician: [name] Referring [name]: Treating [name]/Extender: [name] Weeks in Treatment: 25 Encounter  Discharge Information Items Discharge Condition: Stable Ambulatory Status: Knee Scooter Discharge Destination: Home Transportation: Private Auto Accompanied By: Wife Schedule Follow-up Appointment: Yes Clinical Summary of Care: Electronic Signature(s) Signed: 02/22/2022 1:56:32 PM By: [name] EMT Entered By: [name] on 02/22/2022 13:56:32 -------------------------------------------------------------------------------- Vitals Details Patient Name: Date of Service: NA Colin [name], [name] 02/22/2022 8:00 A M Medical Record Number: [id number] Patient Account Number: [id number] Date of Birth/Sex: Treating RN: Oct 17, 1955 (66 y.o. [name] Primary Care Juwon Scripter: [name] Other Clinician: [name] Referring Aubrey Blackard: Treating Carlynn Leduc/Extender: [name] Weeks in Treatment: 25 Vital Signs Time Taken: 08:41 Temperature (F): 97.7 Height (in): 74 Pulse (bpm): 84 Weight (lbs): 186 Respiratory Rate (breaths/min): 16 Body Mass Index (BMI): 23.9 Blood Pressure (mmHg): 139/88 Capillary Blood Glucose (mg/dl): 770 Reference Range: 80 - 120 mg / dl Electronic Signature(s) Signed: 02/22/2022 1:52:59 PM By: [name] EMT Entered By: [name] on 02/22/2022 13:52:59

## 2022-02-22 NOTE — Progress Notes (Signed)
[name], [name] ([id number]) Visit Report for 02/22/2022 Problem List Details Patient Name: Date of Service: NA [name] 02/22/2022 8:00 A M Medical Record Number: [id number] Patient Account Number: [id number] Date of Birth/Sex: Treating RN: [birth year] (66 y.o. 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 02/22/2022 Medical Record Number: [id number] Patient Account Number: [id number] Date of Birth/Sex: Treating RN: 1955/08/20 (66 y.o. 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 Facility Procedures CPT4 Code: 50093818 Description: G0277-(Facility Use Only) HBOT full body chamber, , ICD-10 Diagnosis Description E11.621 Type 2 diabetes mellitus with foot ulcer 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 M86.672 Other chronic  osteomyelitis, left ankle and foot 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

## 2022-02-22 NOTE — Progress Notes (Signed)
STERLING, MONDO ([id number]) Visit Report for 02/21/2022 Problem List Details Patient Name: Date of Service: NA Colin Nephew A. 02/21/2022 8:00 A M Medical Record Number: [id number] Patient Account Number: [id number] Date of Birth/Sex: Treating RN: 05/28/1956 (66 y.o. 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 02/21/2022 Medical Record Number: [id number] Patient Account Number: [id number] Date of Birth/Sex: Treating RN: 12-07-1955 (66 y.o. 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 Facility Procedures CPT4 Code: 72536644 Description: G0277-(Facility Use Only) HBOT full body chamber, , ICD-10 Diagnosis Description E11.621 Type 2 diabetes mellitus with foot ulcer 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 M86.672 Other chronic  osteomyelitis, left ankle and foot 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

## 2022-02-23 ENCOUNTER — Encounter (HOSPITAL_BASED_OUTPATIENT_CLINIC_OR_DEPARTMENT_OTHER): Payer: BC Managed Care – PPO | Admitting: General Surgery

## 2022-02-23 DIAGNOSIS — I2510 Atherosclerotic heart disease of native coronary artery without angina pectoris: Secondary | ICD-10-CM | POA: Diagnosis not present

## 2022-02-23 DIAGNOSIS — E11610 Type 2 diabetes mellitus with diabetic neuropathic arthropathy: Secondary | ICD-10-CM | POA: Diagnosis not present

## 2022-02-23 DIAGNOSIS — E11621 Type 2 diabetes mellitus with foot ulcer: Secondary | ICD-10-CM | POA: Diagnosis not present

## 2022-02-23 DIAGNOSIS — T8131XA Disruption of external operation (surgical) wound, not elsewhere classified, initial encounter: Secondary | ICD-10-CM | POA: Diagnosis not present

## 2022-02-23 DIAGNOSIS — L97324 Non-pressure chronic ulcer of left ankle with necrosis of bone: Secondary | ICD-10-CM | POA: Diagnosis not present

## 2022-02-23 DIAGNOSIS — M86672 Other chronic osteomyelitis, left ankle and foot: Secondary | ICD-10-CM | POA: Diagnosis not present

## 2022-02-23 LAB — GLUCOSE, CAPILLARY
Glucose-Capillary: 252 mg/dL — ABNORMAL HIGH (ref 70–99)
Glucose-Capillary: 230 mg/dL — ABNORMAL HIGH (ref 70–99)

## 2022-02-23 NOTE — Progress Notes (Signed)
IVERY, NANNEY ([id number]) Visit Report for 02/23/2022 Arrival Information Details Patient Name: Date of Service: NA [name] 02/23/2022 8:00 A M Medical Record Number: [id number] Patient Account Number: [id number] Date of Birth/Sex: Treating RN: [birth year] (66 y.o. [name] Primary Care Damariz Paganelli: [name] Other Clinician: [name] Referring Romar Woodrick: Treating Kiernan Farkas/Extender: [name] in Treatment: 26 Visit Information History Since Last Visit All ordered tests and consults were completed: Yes Patient Arrived: Knee Scooter Added or deleted any medications: No Arrival Time: 08:24 Any new allergies or adverse reactions: No Accompanied By: Wife Had a fall or experienced change in No Transfer Assistance: None activities of daily living that may affect Patient Identification Verified: Yes risk of falls: Secondary Verification Process Completed: Yes Signs or symptoms of abuse/neglect since last visito No Patient Requires Transmission-Based Precautions: No Hospitalized since last visit: No Patient Has Alerts: Yes Implantable device outside of the clinic excluding No Patient Alerts: Patient on Blood Thinner cellular tissue based products placed in the center since last visit: Pain Present Now: No Electronic Signature(s) Signed: 02/23/2022 3:23:47 PM By: [name] EMT Entered By: [name] on 02/23/2022 15:23:46 -------------------------------------------------------------------------------- Encounter Discharge Information Details Patient Name: Date of Service: NA [name] 02/23/2022 8:00 A M Medical Record Number: [id number] Patient Account Number: [id number] Date of Birth/Sex: Treating RN: 09-29-1955 (66 y.o. [name] Primary Care Kaileb Monsanto: [name] Other Clinician: [name] Referring Kalman Nylen: Treating Guillermina Shaft/Extender: [name]: 26 Encounter  Discharge Information Items Discharge Condition: Stable Ambulatory Status: Knee Scooter Discharge Destination: Home Transportation: Private Auto Accompanied By: Wife Schedule Follow-up Appointment: Yes Clinical Summary of Care: Electronic Signature(s) Signed: 02/23/2022 3:28:46 PM By: [name] EMT Entered By: [name] on 02/23/2022 15:28:46 -------------------------------------------------------------------------------- Vitals Details Patient Name: Date of Service: NA [name], [name] 02/23/2022 8:00 A M Medical Record Number: [id number] Patient Account Number: [id number] Date of Birth/Sex: Treating RN: Oct 16, 1955 (66 y.o. [name] Primary Care Giavanna Kang: [name] Other Clinician: [name] Referring Bashir Marchetti: Treating Brinton Brandel/Extender: [name]: 26 Vital Signs Time Taken: 08:41 Temperature (F): 97.9 Height (in): 74 Pulse (bpm): 90 Weight (lbs): 186 Respiratory Rate (breaths/min): 16 Body Mass Index (BMI): 23.9 Blood Pressure (mmHg): 132/89 Capillary Blood Glucose (mg/dl): 366 Reference Range: 80 - 120 mg / dl Electronic Signature(s) Signed: 02/23/2022 3:24:11 PM By: [name] EMT Entered By: [name] on 02/23/2022 15:24:11

## 2022-02-23 NOTE — Progress Notes (Addendum)
[name] ([id number]) Visit Report for 02/23/2022 HBO Details Patient Name: Date of Service: NA Colin Nephew A. 02/23/2022 8:00 A M Medical Record Number: [id number] Patient Account Number: [id number] Date of Birth/Sex: Treating RN: [birth year] (66 y.o. [name] Primary Care Kieara Schwark: [name] Other Clinician: [name] Referring Saadia Dewitt: Treating Kafi Dotter/Extender: [name]: 26 HBO Treatment Course Details Treatment Course Number: 1 Ordering Lalania Haseman: [name] T Treatments Ordered: otal 120 HBO Treatment Start Date: 09/20/2021 HBO Indication: Diabetic Ulcer(s) of the Lower Extremity Standard/Conservative Wound Care tried and failed greater than or equal to 30 days HBO Treatment Details Treatment Number: 100 Patient Type: Outpatient Chamber Type: Monoplace Chamber Serial #: [id number] Treatment Protocol: 2.0 ATA with 90 minutes oxygen, with two 5 minute air breaks Treatment Details Compression Rate Down: 2.0 psi / minute De-Compression Rate Up: 2.0 psi / minute A breaks and breathing ir Compress Tx Pressure periods Decompress Decompress Begins Reached (leave unused spaces Begins Ends blank) Chamber Pressure (ATA 1 2 2 2 2 2  --2 1 ) Clock Time (24 hr) 08:46 08:58 09:28 09:33 10:03 10:08 - - 10:38 10:50 Treatment Length: 124 (minutes) Treatment Segments: 4 Vital Signs Capillary Blood Glucose Reference Range: 80 - 120 mg / dl HBO Diabetic Blood Glucose Intervention Range: <131 mg/dl or mg/dl Time Vitals Blood Respiratory Capillary Blood Glucose Pulse Action Type: Pulse: Temperature: Taken: Pressure: Rate: Glucose (mg/dl): Meter #: Oximetry (%) Taken: Pre 08:41 132/89 90 16 97.9 252 Post 10:54 168/87 75 16 97.8 230 Treatment Response Treatment Toleration: Well Treatment Completion Status: Treatment Completed without Adverse Event Additional Procedure Documentation Tissue Sevierity: Necrosis of  bone Physician HBO Attestation: I certify that I supervised this HBO treatment in accordance with Medicare guidelines. A trained emergency response team is readily available per Yes hospital policies and procedures. Continue HBOT as ordered. Yes Electronic Signature(s) Signed: 02/23/2022 4:12:13 PM By: 02/25/2022 MD FACS Previous Signature: 02/23/2022 3:27:11 PM Version By: 02/25/2022 EMT Entered By: [name] on 02/23/2022 16:12:13 -------------------------------------------------------------------------------- HBO Safety Checklist Details Patient Name: Date of Service: 02/25/2022 Colin A. 02/23/2022 8:00 A M Medical Record Number: 02/25/2022 Patient Account Number: [id number] Date of Birth/Sex: Treating RN: May 02, 1956 (66 y.o. 77 Primary Care Aric Jost: [name] Other Clinician: [name] Referring Berlin Mokry: Treating Happy Begeman/Extender: [name]: 26 HBO Safety Checklist Items Safety Checklist Consent Form Signed Patient voided / foley secured and emptied When did you last eato 0630 Last dose of injectable or oral agent 0630 Ostomy pouch emptied and vented if applicable NA All implantable devices assessed, documented and approved NA Intravenous access site secured and place NA Valuables secured Linens and cotton and cotton/polyester blend (less than 51% polyester) Personal oil-based products / skin lotions / body lotions removed Wigs or hairpieces removed NA Smoking or tobacco materials removed Books / newspapers / magazines / loose paper removed Cologne, aftershave, perfume and deodorant removed Jewelry removed (may wrap wedding band) NA Make-up removed NA Hair care products removed Battery operated devices (external) removed Heating patches and chemical warmers removed Titanium eyewear removed NA Nail polish cured greater than 10 hours NA Casting material cured greater than 10  hours NA Hearing aids removed NA Loose dentures or partials removed NA Prosthetics have been removed NA Patient demonstrates correct use of air break device (if applicable) Patient concerns have been addressed Patient grounding bracelet on and cord attached to chamber Specifics for Inpatients (complete  in addition to above) Medication sheet sent with patient NA Intravenous medications needed or due during therapy sent with patient NA Drainage tubes (e.g. nasogastric tube or chest tube secured and vented) NA Endotracheal or Tracheotomy tube secured NA Cuff deflated of air and inflated with saline NA Airway suctioned NA Notes The safety checklist was done before the treatment was started. Electronic Signature(s) Signed: 02/23/2022 3:25:50 PM By: [name] EMT Entered By: [name] on 02/23/2022 15:25:50

## 2022-02-23 NOTE — Progress Notes (Signed)
ELYON, ZOLL ([id number]) Visit Report for 02/23/2022 Problem List Details Patient Name: Date of Service: NA [name] 02/23/2022 8:00 A M Medical Record Number: [id number] Patient Account Number: [id number] Date of Birth/Sex: Treating RN: [birth year] (66 y.o. 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 02/23/2022 Medical Record Number: [id number] Patient Account Number: [id number] Date of Birth/Sex: Treating RN: 01/24/1956 (66 y.o. 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 Facility Procedures CPT4 Code: 20254270 Description: G0277-(Facility Use Only) HBOT full body chamber, , ICD-10 Diagnosis Description E11.621 Type 2 diabetes mellitus with foot ulcer 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 M86.672 Other chronic  osteomyelitis, left ankle and foot 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

## 2022-02-24 ENCOUNTER — Encounter (HOSPITAL_BASED_OUTPATIENT_CLINIC_OR_DEPARTMENT_OTHER): Payer: BC Managed Care – PPO | Admitting: General Surgery

## 2022-02-24 DIAGNOSIS — T8131XA Disruption of external operation (surgical) wound, not elsewhere classified, initial encounter: Secondary | ICD-10-CM | POA: Diagnosis not present

## 2022-02-25 ENCOUNTER — Encounter (HOSPITAL_BASED_OUTPATIENT_CLINIC_OR_DEPARTMENT_OTHER): Payer: BC Managed Care – PPO | Admitting: General Surgery

## 2022-02-25 DIAGNOSIS — E11621 Type 2 diabetes mellitus with foot ulcer: Secondary | ICD-10-CM | POA: Diagnosis not present

## 2022-02-25 DIAGNOSIS — L97324 Non-pressure chronic ulcer of left ankle with necrosis of bone: Secondary | ICD-10-CM | POA: Diagnosis not present

## 2022-02-25 DIAGNOSIS — M86672 Other chronic osteomyelitis, left ankle and foot: Secondary | ICD-10-CM | POA: Diagnosis not present

## 2022-02-25 DIAGNOSIS — I2510 Atherosclerotic heart disease of native coronary artery without angina pectoris: Secondary | ICD-10-CM | POA: Diagnosis not present

## 2022-02-25 DIAGNOSIS — T8131XA Disruption of external operation (surgical) wound, not elsewhere classified, initial encounter: Secondary | ICD-10-CM | POA: Diagnosis not present

## 2022-02-25 DIAGNOSIS — E11610 Type 2 diabetes mellitus with diabetic neuropathic arthropathy: Secondary | ICD-10-CM | POA: Diagnosis not present

## 2022-02-25 LAB — GLUCOSE, CAPILLARY
Glucose-Capillary: 125 mg/dL — ABNORMAL HIGH (ref 70–99)
Glucose-Capillary: 121 mg/dL — ABNORMAL HIGH (ref 70–99)
Glucose-Capillary: 185 mg/dL — ABNORMAL HIGH (ref 70–99)

## 2022-02-25 NOTE — Progress Notes (Signed)
[name], [name] ([id number]) Visit Report for 02/25/2022 HBO Safety Checklist Details Patient Name: Date of Service: NA [name] 02/25/2022 8:00 A M Medical Record Number: [id number] Patient Account Number: [id number] Date of Birth/Sex: Treating RN: 1956-01-08 (66 y.o. [name] Primary Care Jahson Emanuele: [name] Other Clinician: [name] Referring Phila Shoaf: Treating Jori Frerichs/Extender: [name]: 26 HBO Safety Checklist Items Safety Checklist Consent Form Signed Patient voided / foley secured and emptied When did you last eato 0715 Last dose of injectable or oral agent 0645 Ostomy pouch emptied and vented if applicable NA All implantable devices assessed, documented and approved NA Intravenous access site secured and place NA Valuables secured Linens and cotton and cotton/polyester blend (less than 51% polyester) Personal oil-based products / skin lotions / body lotions removed Wigs or hairpieces removed NA Smoking or tobacco materials removed Books / newspapers / magazines / loose paper removed Cologne, aftershave, perfume and deodorant removed Jewelry removed (may wrap wedding band) NA Make-up removed NA Hair care products removed Battery operated devices (external) removed Heating patches and chemical warmers removed Titanium eyewear removed NA Nail polish cured greater than 10 hours NA Casting material cured greater than 10 hours NA Hearing aids removed NA Loose dentures or partials removed NA Prosthetics have been removed NA Patient demonstrates correct use of air break device (if applicable) Patient concerns have been addressed Patient grounding bracelet on and cord attached to chamber Specifics for Inpatients (complete in addition to above) Medication sheet sent with patient NA Intravenous medications needed or due during therapy sent with patient NA Drainage tubes (e.g. nasogastric tube or  chest tube secured and vented) NA Endotracheal or Tracheotomy tube secured NA Cuff deflated of air and inflated with saline NA Airway suctioned NA Notes The safety checklist was done before the treatment was started. Electronic Signature(s) Signed: 02/25/2022 12:33:16 PM By: [name] EMT Entered By: [name] on 02/25/2022 12:33:15

## 2022-02-25 NOTE — Progress Notes (Signed)
CELEDONIO, SORTINO ([id number]) Visit Report for 02/25/2022 Arrival Information Details Patient Name: Date of Service: NA [name] 02/25/2022 10:45 A M Medical Record Number: [id number] Patient Account Number: [id number] Date of Birth/Sex: Treating RN: 08-Aug-1955 (66 y.o. [name] Primary Care [name]: [name] Other Clinician: Referring Alawna Graybeal: Treating Ruger Saxer/Extender: [name] in Treatment: 26 Visit Information History Since Last Visit Added or deleted any medications: No Patient Arrived: Knee Scooter Any new allergies or adverse reactions: No Arrival Time: 11:07 Had a fall or experienced change in No Accompanied By: self activities of daily living that may affect Transfer Assistance: None risk of falls: Patient Identification Verified: Yes Signs or symptoms of abuse/neglect since last visito No Secondary Verification Process Completed: Yes Hospitalized since last visit: No Patient Requires Transmission-Based Precautions: No Implantable device outside of the clinic excluding No Patient Has Alerts: Yes cellular tissue based products placed in the center Patient Alerts: Patient on Blood Thinner since last visit: Has Dressing in Place as Prescribed: Yes Pain Present Now: No Electronic Signature(s) Signed: 02/25/2022 4:48:09 PM By: Samuella Bruin Entered By: Samuella Bruin on 02/25/2022 11:07:54 -------------------------------------------------------------------------------- Encounter Discharge Information Details Patient Name: Date of Service: NA Colin Colin Halsted A. 02/25/2022 10:45 A M Medical Record Number: [id number] Patient Account Number: [id number] Date of Birth/Sex: Treating RN: 01-08-1956 (66 y.o. [name] Primary Care [name]: [name] Other Clinician: Referring Klaudia Beirne: Treating Eryc Bodey/Extender: [name] in Treatment: 26 Encounter Discharge Information  Items Discharge Condition: Stable Ambulatory Status: Knee Scooter Discharge Destination: Home Transportation: Private Auto Accompanied By: self Schedule Follow-up Appointment: Yes Clinical Summary of Care: Patient Declined Electronic Signature(s) Signed: 02/25/2022 4:48:09 PM By: Samuella Bruin Entered By: Samuella Bruin on 02/25/2022 11:40:46 -------------------------------------------------------------------------------- Negative Pressure Wound Therapy Maintenance (NPWT) Details Patient Name: Date of Service: Colin Fanning A. 02/25/2022 10:45 A M Medical Record Number: [id number] Patient Account Number: [id number] Date of Birth/Sex: Treating RN: 05/21/1956 (66 y.o. [name] Primary Care [name]: [name] Other Clinician: Referring Addisyn Leclaire: Treating Kandas Oliveto/Extender: [name]: 26 NPWT Maintenance Performed for: Wound #1 Left, Medial Foot Performed By: Samuella Bruin, RN Type: VAC System Coverage Size (sq cm): 12.88 Pressure Type: Constant Pressure Setting: 100 mmHG Drain Type: None Sponge/Dressing Type: Foam, Black Date Initiated: 03/19/2021 Dressing Removed: Yes Quantity of Sponges/Gauze Removed: 2 Canister Changed: Yes Dressing Reapplied: Yes Quantity of Sponges/Gauze Inserted: 2 Days On NPWT : 344 Electronic Signature(s) Signed: 02/25/2022 4:48:09 PM By: Samuella Bruin Entered By: Samuella Bruin on 02/25/2022 11:40:20 -------------------------------------------------------------------------------- Patient/Caregiver Education Details Patient Name: Date of Service: NA [name] 9/15/2023andnbsp10:45 A M Medical Record Number: [id number] Patient Account Number: [id number] Date of Birth/Gender: Treating RN: Sep 15, 1955 (66 y.o. [name] Primary Care Physician: [name] Other Clinician: Referring Physician: Treating Physician/Extender: [name] in Treatment: 26 Education Assessment Education Provided To: Patient Education Topics Provided Infection: Methods: Explain/Verbal Responses: Reinforcements needed, State content correctly Electronic Signature(s) Signed: 02/25/2022 4:48:09 PM By: Samuella Bruin Entered By: Samuella Bruin on 02/25/2022 11:40:36 -------------------------------------------------------------------------------- Wound Assessment Details Patient Name: Date of Service: Colin Fanning A. 02/25/2022 10:45 A M Medical Record Number: [id number] Patient Account Number: [id number] Date of Birth/Sex: Treating RN: 08-Aug-1955 (66 y.o. [name] Primary Care [name]: [name] Other Clinician: Referring Eldene Plocher: Treating Athira Janowicz/Extender: [name]: 26 Wound Status Wound Number: 1 Primary Diabetic Wound/Ulcer of  the Lower Extremity Etiology: Wound Location: Left, Medial Foot Wound Open Wounding Event: Gradually Appeared Status: Date Acquired: 02/22/2021 Comorbid Cataracts, Coronary Artery Disease, Hypertension, Myocardial Weeks Of Treatment: 26 History: Infarction, Peripheral Arterial Disease, Type II Diabetes, Clustered Wound: No Osteomyelitis, Neuropathy Wound Measurements Length: (cm) 5.6 Width: (cm) 2.3 Depth: (cm) 0.1 Area: (cm) 10.116 Volume: (cm) 1.012 % Reduction in Area: 28.4% % Reduction in Volume: 92.8% Epithelialization: Medium (34-66%) Wound Description Classification: Grade 3 Wound Margin: Distinct, outline attached Exudate Amount: Medium Exudate Type: Serosanguineous Exudate Color: red, brown Foul Odor After Cleansing: No Slough/Fibrino Yes Wound Bed Granulation Amount: Large (67-100%) Exposed Structure Granulation Quality: Red, Hyper-granulation Fascia Exposed: No Necrotic Amount: Small (1-33%) Fat Layer (Subcutaneous Tissue) Exposed: Yes Necrotic Quality: Adherent Slough Tendon Exposed: No Muscle  Exposed: No Joint Exposed: No Bone Exposed: No Treatment Notes Wound #1 (Foot) Wound Laterality: Left, Medial Cleanser Wound Cleanser Discharge Instruction: Cleanse the wound with wound cleanser prior to applying a clean dressing using gauze sponges, not tissue or cotton balls. Peri-Wound Care Topical Primary Dressing Apligraf Discharge Instruction: DO NOT REMOVE Secondary Dressing Woven Gauze Sponge, Non-Sterile 4x4 in Discharge Instruction: In clinic. Home Health to apply wound vac. Secured With Elastic Bandage 4 inch (ACE bandage) Discharge Instruction: Secure with ACE bandage as directed. Kerlix Roll Sterile, 4.5x3.1 (in/yd) Discharge Instruction: In clinic. Home Health to apply wound vac. 31M Medipore H Soft Cloth Surgical Tape, 4 x 10 (in/yd) Discharge Instruction: In clinic. Home Health to apply wound vac. Compression Wrap Compression Stockings Add-Ons Electronic Signature(s) Signed: 02/25/2022 4:48:09 PM By: Samuella Bruin Entered By: Samuella Bruin on 02/25/2022 11:10:19 -------------------------------------------------------------------------------- Vitals Details Patient Name: Date of Service: NA Colin Colin Halsted A. 02/25/2022 10:45 A M Medical Record Number: [id number] Patient Account Number: [id number] Date of Birth/Sex: Treating RN: 1955/09/12 (66 y.o. [name] Primary Care Lot [place]: [name] Other Clinician: Referring Ashutosh Dieguez: Treating Sayre Mazor/Extender: [name]: 26 Vital Signs Time Taken: 11:08 Temperature (F): 97.9 Height (in): 74 Pulse (bpm): 79 Weight (lbs): 186 Respiratory Rate (breaths/min): 18 Body Mass Index (BMI): 23.9 Blood Pressure (mmHg): 156/90 Capillary Blood Glucose (mg/dl): 409 Reference Range: 80 - 120 mg / dl Electronic Signature(s) Signed: 02/25/2022 4:48:09 PM By: Samuella Bruin Entered By: Samuella Bruin on 02/25/2022 11:09:05

## 2022-02-25 NOTE — Progress Notes (Signed)
[name], [name] ([id number]) Visit Report for 02/25/2022 Problem List Details Patient Name: Date of Service: NA [name] 02/25/2022 8:00 A M Medical Record Number: [id number] Patient Account Number: [id number] Date of Birth/Sex: Treating RN: [birth year] (66 y.o. 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 02/25/2022 Medical Record Number: [id number] Patient Account Number: [id number] Date of Birth/Sex: Treating RN: Jun 10, 1956 (66 y.o. 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 Facility Procedures CPT4 Code: 26948546 Description: G0277-(Facility Use Only) HBOT full body chamber, , ICD-10 Diagnosis Description E11.621 Type 2 diabetes mellitus with foot ulcer 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 M86.672 Other chronic  osteomyelitis, left ankle and foot 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

## 2022-02-25 NOTE — Progress Notes (Addendum)
CODEY, BURLING ([id number]) Visit Report for 02/25/2022 Arrival Information Details Patient Name: Date of Service: NA [name] 02/25/2022 8:00 A M Medical Record Number: [id number] Patient Account Number: [id number] Date of Birth/Sex: Treating RN: Dec 21, 1955 (66 y.o. [name] Primary Care Merick Kelleher: [name] Other Clinician: [name] Referring Jaciel Diem: Treating Keyan Folson/Extender: [name] in Treatment: 26 Visit Information History Since Last Visit All ordered tests and consults were completed: Yes Patient Arrived: Knee Scooter Added or deleted any medications: No Arrival Time: 08:13 Any new allergies or adverse reactions: No Accompanied By: Wife Had a fall or experienced change in No Transfer Assistance: None activities of daily living that may affect Patient Identification Verified: Yes risk of falls: Secondary Verification Process Completed: Yes Signs or symptoms of abuse/neglect since last visito No Patient Requires Transmission-Based Precautions: No Hospitalized since last visit: No Patient Has Alerts: Yes Pain Present Now: No Patient Alerts: Patient on Blood Thinner Electronic Signature(s) Signed: 02/25/2022 12:31:28 PM By: [name] EMT Entered By: [name] on 02/25/2022 12:31:27 -------------------------------------------------------------------------------- Encounter Discharge Information Details Patient Name: Date of Service: NA [name] 02/25/2022 8:00 A M Medical Record Number: [id number] Patient Account Number: [id number] Date of Birth/Sex: Treating RN: [birth year] (66 y.o. [name] Primary Care Azelyn Batie: [name] Other Clinician: [name] Referring Reem Fleury: Treating Jozy Mcphearson/Extender: [name]: 26 Encounter Discharge Information Items Discharge Condition: Stable Ambulatory Status: Knee Scooter Discharge Destination:  Home Transportation: Private Auto Accompanied By: Wife Schedule Follow-up Appointment: Yes Clinical Summary of Care: Electronic Signature(s) Signed: 02/25/2022 12:41:31 PM By: [name] EMT Entered By: [name] on 02/25/2022 12:41:30 -------------------------------------------------------------------------------- Vitals Details Patient Name: Date of Service: NA [name], [name] 02/25/2022 8:00 A M Medical Record Number: [id number] Patient Account Number: [id number] Date of Birth/Sex: Treating RN: [birth year] (66 y.o. [name] Primary Care Shamaine Mulkern: [name] Other Clinician: [name] Referring Kenyette Gundy: Treating Shawnmichael Parenteau/Extender: [name]: 26 Vital Signs Time Taken: 08:26 Temperature (F): 97.9 Height (in): 74 Pulse (bpm): 86 Weight (lbs): 186 Respiratory Rate (breaths/min): 16 Body Mass Index (BMI): 23.9 Blood Pressure (mmHg): 132/73 Capillary Blood Glucose (mg/dl): 562 Reference Range: 80 - 120 mg / dl Electronic Signature(s) Signed: 02/25/2022 12:32:15 PM By: [name] EMT Entered By: [name] on 02/25/2022 12:32:14

## 2022-02-25 NOTE — Progress Notes (Signed)
ETIENNE, MOWERS ([id number]) Visit Report for 02/25/2022 SuperBill Details Patient Name: Date of Service: NA [name] 02/25/2022 Medical Record Number: [id number] Patient Account Number: [id number] Date of Birth/Sex: Treating RN: 1956-05-30 (66 y.o. 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

## 2022-02-26 DIAGNOSIS — T8131XA Disruption of external operation (surgical) wound, not elsewhere classified, initial encounter: Secondary | ICD-10-CM | POA: Diagnosis not present

## 2022-02-27 DIAGNOSIS — T8131XA Disruption of external operation (surgical) wound, not elsewhere classified, initial encounter: Secondary | ICD-10-CM | POA: Diagnosis not present

## 2022-02-28 ENCOUNTER — Encounter (HOSPITAL_BASED_OUTPATIENT_CLINIC_OR_DEPARTMENT_OTHER): Payer: BC Managed Care – PPO | Admitting: General Surgery

## 2022-02-28 DIAGNOSIS — T8131XA Disruption of external operation (surgical) wound, not elsewhere classified, initial encounter: Secondary | ICD-10-CM | POA: Diagnosis not present

## 2022-02-28 DIAGNOSIS — L97324 Non-pressure chronic ulcer of left ankle with necrosis of bone: Secondary | ICD-10-CM | POA: Diagnosis not present

## 2022-02-28 DIAGNOSIS — M86672 Other chronic osteomyelitis, left ankle and foot: Secondary | ICD-10-CM | POA: Diagnosis not present

## 2022-02-28 DIAGNOSIS — E11610 Type 2 diabetes mellitus with diabetic neuropathic arthropathy: Secondary | ICD-10-CM | POA: Diagnosis not present

## 2022-02-28 DIAGNOSIS — E11621 Type 2 diabetes mellitus with foot ulcer: Secondary | ICD-10-CM | POA: Diagnosis not present

## 2022-02-28 DIAGNOSIS — I2510 Atherosclerotic heart disease of native coronary artery without angina pectoris: Secondary | ICD-10-CM | POA: Diagnosis not present

## 2022-02-28 LAB — GLUCOSE, CAPILLARY: Glucose-Capillary: 231 mg/dL — ABNORMAL HIGH (ref 70–99)

## 2022-02-28 NOTE — Progress Notes (Signed)
[name], [name] ([id number]) Visit Report for 02/28/2022 Problem List Details Patient Name: Date of Service: [name] 02/28/2022 8:00 A M Medical Record Number: [id number] Patient Account Number: [id number] Date of Birth/Sex: Treating RN: April 17, 1956 (66 y.o. 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 02/28/2022 Medical Record Number: [id number] Patient Account Number: [id number] Date of Birth/Sex: Treating RN: 1956/03/25 (66 y.o. 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 Facility Procedures CPT4 Code: 61443154 Description: G0277-(Facility Use Only) HBOT full body chamber, 41min , ICD-10 Diagnosis Description E11.621 Type 2 diabetes mellitus with foot ulcer 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 M86.672 Other chronic  osteomyelitis, left ankle and foot 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 Modifier: Quantity: 4 Physician Procedures : CPT4 Code Description Modifier [id number] 95093 - WC PHYS HYPERBARIC OXYGEN THERAPY ICD-10 Diagnosis Description E11.621 Type 2 diabetes mellitus with foot ulcer 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

## 2022-02-28 NOTE — Progress Notes (Addendum)
HABIB, KISE ([id number]) Visit Report for 02/28/2022 HBO Details Patient Name: Date of Service: NA [name] 02/28/2022 8:00 A M Medical Record Number: [id number] Patient Account Number: [id number] Date of Birth/Sex: Treating RN: 1956-04-28 (66 y.o. [name], [name] Primary Care Dayannara Pascal: [name] Other Clinician: [name] Referring Naomee Nowland: Treating Bijon Mineer/Extender: [name]: 26 HBO Treatment Course Details Treatment Course Number: 1 Ordering Macarthur Lorusso: [name] T Treatments Ordered: otal 120 HBO Treatment Start Date: 09/20/2021 HBO Indication: Diabetic Ulcer(s) of the Lower Extremity Standard/Conservative Wound Care tried and failed greater than or equal to 30 days HBO Treatment Details Treatment Number: 102 Patient Type: Outpatient Chamber Type: Monoplace Chamber Serial #: [id number] Treatment Protocol: 2.0 ATA with 90 minutes oxygen, with two 5 minute air breaks Treatment Details Compression Rate Down: 2.0 psi / minute De-Compression Rate Up: 2.0 psi / minute A breaks and breathing ir Compress Tx Pressure periods Decompress Decompress Begins Reached (leave unused spaces Begins Ends blank) Chamber Pressure (ATA 1 2 2 2 2 2  --2 1 ) Clock Time (24 hr) 08:38 08:49 09:19 09:24 09:54 10:00 - - 10:30 10:41 Treatment Length: 123 (minutes) Treatment Segments: 4 Vital Signs Capillary Blood Glucose Reference Range: 80 - 120 mg / dl HBO Diabetic Blood Glucose Intervention Range: <131 mg/dl or mg/dl Time Vitals Blood Respiratory Capillary Blood Glucose Pulse Action Type: Pulse: Temperature: Taken: Pressure: Rate: Glucose (mg/dl): Meter #: Oximetry (%) Taken: Pre 08:29 139/92 97 16 97.5 231 Post 10:44 153/78 79 16 97.7 224 Treatment Response Treatment Toleration: Well Treatment Completion Status: Treatment Completed without Adverse Event Additional Procedure Documentation Tissue Sevierity: Necrosis  of bone Physician HBO Attestation: I certify that I supervised this HBO treatment in accordance with Medicare guidelines. A trained emergency response team is readily available per Yes hospital policies and procedures. Continue HBOT as ordered. Yes Electronic Signature(s) Signed: 02/28/2022 2:54:55 PM By: 03/02/2022 MD FACS Previous Signature: 02/28/2022 2:27:37 PM Version By: 03/02/2022 EMT Entered By: [name] on 02/28/2022 14:54:55 -------------------------------------------------------------------------------- HBO Safety Checklist Details Patient Name: Date of Service: 03/02/2022 [name] 02/28/2022 8:00 A M Medical Record Number: 03/02/2022 Patient Account Number: [id number] Date of Birth/Sex: Treating RN: May 23, 1956 (66 y.o. 79, [name] Primary Care Abigael Mogle: [name] Other Clinician: [name] Referring Tomasa Dobransky: Treating Maury Groninger/Extender: [name]: 26 HBO Safety Checklist Items Safety Checklist Consent Form Signed Patient voided / foley secured and emptied When did you last eato 0650 Last dose of injectable or oral agent 0650 Ostomy pouch emptied and vented if applicable NA All implantable devices assessed, documented and approved NA Intravenous access site secured and place NA Valuables secured Linens and cotton and cotton/polyester blend (less than 51% polyester) Personal oil-based products / skin lotions / body lotions removed Wigs or hairpieces removed NA Smoking or tobacco materials removed Books / newspapers / magazines / loose paper removed Cologne, aftershave, perfume and deodorant removed Jewelry removed (may wrap wedding band) NA Make-up removed NA Hair care products removed Battery operated devices (external) removed Heating patches and chemical warmers removed Titanium eyewear removed NA Nail polish cured greater than 10 hours NA Casting material cured greater than 10  hours NA Hearing aids removed NA Loose dentures or partials removed NA Prosthetics have been removed NA Patient demonstrates correct use of air break device (if applicable) Patient concerns have been addressed Patient grounding bracelet on and cord attached to chamber Specifics for Inpatients (complete  in addition to above) Medication sheet sent with patient NA Intravenous medications needed or due during therapy sent with patient NA Drainage tubes (e.g. nasogastric tube or chest tube secured and vented) NA Endotracheal or Tracheotomy tube secured NA Cuff deflated of air and inflated with saline NA Airway suctioned NA Notes The safety checklist was done before the treatment was started. Electronic Signature(s) Signed: 02/28/2022 2:25:00 PM By: [name] EMT Entered By: [name] on 02/28/2022 14:25:00

## 2022-02-28 NOTE — Progress Notes (Addendum)
TORRIS, HOUSE ([id number]) Visit Report for 02/28/2022 Arrival Information Details Patient Name: Date of Service: NA [name] 02/28/2022 8:00 A M Medical Record Number: [id number] Patient Account Number: [id number] Date of Birth/Sex: Treating RN: June 05, 1956 (65 y.o. [name], [name] Primary Care Depaul Arizpe: [name] Other Clinician: [name] Referring Alexina Niccoli: Treating Megan Hayduk/Extender: [name] in Treatment: 84 Visit Information History Since Last Visit All ordered tests and consults were completed: Yes Patient Arrived: Knee Scooter Added or deleted any medications: No Arrival Time: 08:15 Any new allergies or adverse reactions: No Accompanied By: Wife Had a fall or experienced change in No Transfer Assistance: None activities of daily living that may affect Patient Identification Verified: Yes risk of falls: Secondary Verification Process Completed: Yes Signs or symptoms of abuse/neglect since last visito No Patient Requires Transmission-Based Precautions: No Hospitalized since last visit: No Patient Has Alerts: Yes Implantable device outside of the clinic excluding No Patient Alerts: Patient on Blood Thinner cellular tissue based products placed in the center since last visit: Pain Present Now: No Electronic Signature(s) Signed: 02/28/2022 2:23:15 PM By: [name] EMT Entered By: [name] on 02/28/2022 14:23:15 -------------------------------------------------------------------------------- Encounter Discharge Information Details Patient Name: Date of Service: NA RRO [name] [name] 02/28/2022 8:00 A M Medical Record Number: [id number] Patient Account Number: [id number] Date of Birth/Sex: Treating RN: Jun 14, 1955 (66 y.o. [name], [name] Primary Care Dicky Boer: [name] Other Clinician: [name] Referring Bertina Guthridge: Treating Perry Molla/Extender: [name] Weeks in Treatment: 26 Encounter  Discharge Information Items Discharge Condition: Stable Ambulatory Status: Knee Scooter Discharge Destination: Home Transportation: Private Auto Accompanied By: Wife Schedule Follow-up Appointment: Yes Clinical Summary of Care: Electronic Signature(s) Signed: 02/28/2022 2:29:11 PM By: [name] EMT Entered By: [name] on 02/28/2022 14:29:10 -------------------------------------------------------------------------------- Vitals Details Patient Name: Date of Service: NA RRO Delane Ginger, [name] 02/28/2022 8:00 A M Medical Record Number: [id number] Patient Account Number: [id number] Date of Birth/Sex: Treating RN: 10-06-1955 (66 y.o. [name], [name] Primary Care Imogine Carvell: [name] Other Clinician: [name] Referring Victoriya Pol: Treating Jaishawn Witzke/Extender: [name] Weeks in Treatment: 26 Vital Signs Time Taken: 08:29 Temperature (F): 97.5 Height (in): 74 Pulse (bpm): 97 Weight (lbs): 186 Respiratory Rate (breaths/min): 16 Body Mass Index (BMI): 23.9 Blood Pressure (mmHg): 139/92 Capillary Blood Glucose (mg/dl): 231 Reference Range: 80 - 120 mg / dl Electronic Signature(s) Signed: 02/28/2022 2:23:47 PM By: [name] EMT Entered By: [name] on 02/28/2022 14:23:47

## 2022-03-01 ENCOUNTER — Encounter (HOSPITAL_BASED_OUTPATIENT_CLINIC_OR_DEPARTMENT_OTHER): Payer: BC Managed Care – PPO | Admitting: General Surgery

## 2022-03-01 DIAGNOSIS — I2510 Atherosclerotic heart disease of native coronary artery without angina pectoris: Secondary | ICD-10-CM | POA: Diagnosis not present

## 2022-03-01 DIAGNOSIS — E11621 Type 2 diabetes mellitus with foot ulcer: Secondary | ICD-10-CM | POA: Diagnosis not present

## 2022-03-01 DIAGNOSIS — M86672 Other chronic osteomyelitis, left ankle and foot: Secondary | ICD-10-CM | POA: Diagnosis not present

## 2022-03-01 DIAGNOSIS — L97324 Non-pressure chronic ulcer of left ankle with necrosis of bone: Secondary | ICD-10-CM | POA: Diagnosis not present

## 2022-03-01 DIAGNOSIS — T8131XA Disruption of external operation (surgical) wound, not elsewhere classified, initial encounter: Secondary | ICD-10-CM | POA: Diagnosis not present

## 2022-03-01 DIAGNOSIS — E11610 Type 2 diabetes mellitus with diabetic neuropathic arthropathy: Secondary | ICD-10-CM | POA: Diagnosis not present

## 2022-03-01 LAB — GLUCOSE, CAPILLARY
Glucose-Capillary: 244 mg/dL — ABNORMAL HIGH (ref 70–99)
Glucose-Capillary: 178 mg/dL — ABNORMAL HIGH (ref 70–99)
Glucose-Capillary: 156 mg/dL — ABNORMAL HIGH (ref 70–99)

## 2022-03-01 NOTE — Progress Notes (Addendum)
[name], [name] ([id number]) Visit Report for 03/01/2022 HBO Details Patient Name: Date of Service: NA Colin Nephew A. 03/01/2022 8:00 A M Medical Record Number: [id number] Patient Account Number: [id number] Date of Birth/Sex: Treating RN: 1955-12-16 (66 y.o. [name] Primary Care Sam Overbeck: [name] Other Clinician: [name] Referring Jacie Tristan: Treating Dorena Dorfman/Extender: [name]: 26 HBO Treatment Course Details Treatment Course Number: 1 Ordering Amery Minasyan: [name] T Treatments Ordered: otal 120 HBO Treatment Start Date: 09/20/2021 HBO Indication: Diabetic Ulcer(s) of the Lower Extremity Standard/Conservative Wound Care tried and failed greater than or equal to 30 days HBO Treatment Details Treatment Number: 103 Patient Type: Outpatient Chamber Type: Monoplace Chamber Serial #: [id number] Treatment Protocol: 2.0 ATA with 90 minutes oxygen, with two 5 minute air breaks Treatment Details Compression Rate Down: 2.0 psi / minute De-Compression Rate Up: 2.0 psi / minute A breaks and breathing ir Compress Tx Pressure periods Decompress Decompress Begins Reached (leave unused spaces Begins Ends blank) Chamber Pressure (ATA 1 2 2 2 2 2  --2 1 ) Clock Time (24 hr) 08:39 08:52 09:22 09:28 09:58 10:02 - - 10:33 10:42 Treatment Length: 123 (minutes) Treatment Segments: 4 Vital Signs Capillary Blood Glucose Reference Range: 80 - 120 mg / dl HBO Diabetic Blood Glucose Intervention Range: <131 mg/dl or mg/dl Time Vitals Blood Respiratory Capillary Blood Glucose Pulse Action Type: Pulse: Temperature: Taken: Pressure: Rate: Glucose (mg/dl): Meter #: Oximetry (%) Taken: Pre 08:33 134/75 83 16 97.5 178 Post 10:47 165/93 74 18 97.5 156 Treatment Response Treatment Toleration: Well Treatment Completion Status: Treatment Completed without Adverse Event Additional Procedure Documentation Tissue Sevierity: Necrosis  of bone Physician HBO Attestation: I certify that I supervised this HBO treatment in accordance with Medicare guidelines. A trained emergency response team is readily available per Yes hospital policies and procedures. Continue HBOT as ordered. Yes Electronic Signature(s) Signed: 03/01/2022 3:47:40 PM By: 03/03/2022 MD FACS Previous Signature: 03/01/2022 2:21:15 PM Version By: 03/03/2022 EMT Entered By: [name] on 03/01/2022 15:47:39 -------------------------------------------------------------------------------- HBO Safety Checklist Details Patient Name: Date of Service: 03/03/2022 [name] 03/01/2022 8:00 A M Medical Record Number: 03/03/2022 Patient Account Number: [id number] Date of Birth/Sex: Treating RN: 05-Jun-1956 (66 y.o. 79 Primary Care Ghadeer Kastelic: [name] Other Clinician: [name] Referring Saharah Sherrow: Treating Matheu Ploeger/Extender: [name]: 26 HBO Safety Checklist Items Safety Checklist Consent Form Signed Patient voided / foley secured and emptied When did you last eato 0645 Last dose of injectable or oral agent 0645 Ostomy pouch emptied and vented if applicable NA All implantable devices assessed, documented and approved NA Intravenous access site secured and place NA Valuables secured Linens and cotton and cotton/polyester blend (less than 51% polyester) Personal oil-based products / skin lotions / body lotions removed Wigs or hairpieces removed NA Smoking or tobacco materials removed Books / newspapers / magazines / loose paper removed Cologne, aftershave, perfume and deodorant removed Jewelry removed (may wrap wedding band) NA Make-up removed NA Hair care products removed Battery operated devices (external) removed Heating patches and chemical warmers removed Titanium eyewear removed NA Nail polish cured greater than 10 hours NA Casting material cured greater than 10  hours NA Hearing aids removed NA Loose dentures or partials removed NA Prosthetics have been removed NA Patient demonstrates correct use of air break device (if applicable) Patient concerns have been addressed Patient grounding bracelet on and cord attached to chamber Specifics for Inpatients (complete  in addition to above) Medication sheet sent with patient NA Intravenous medications needed or due during therapy sent with patient NA Drainage tubes (e.g. nasogastric tube or chest tube secured and vented) NA Endotracheal or Tracheotomy tube secured NA Cuff deflated of air and inflated with saline NA Airway suctioned NA Notes The safety checklist was done before the treatment was started. Electronic Signature(s) Signed: 03/01/2022 2:19:12 PM By: [name] EMT Entered By: [name] on 03/01/2022 14:19:12

## 2022-03-01 NOTE — Progress Notes (Addendum)
Colin, [name] ([id number]) Visit Report for 03/01/2022 Arrival Information Details Patient Name: Date of Service: [name] 03/01/2022 8:00 A M Medical Record Number: [id number] Patient Account Number: [id number] Date of Birth/Sex: Treating RN: Oct 29, 1955 (66 y.o. [name] Primary Care [name]: [name] Other Clinician: [name] Referring Rumi Kolodziej: Treating Lejend Dalby/Extender: [name] in Treatment: 46 Visit Information History Since Last Visit All ordered tests and consults were completed: Yes Patient Arrived: Knee Scooter Added or deleted any medications: No Arrival Time: 08:12 Any new allergies or adverse reactions: No Accompanied By: Wife Had a fall or experienced change in No Transfer Assistance: None activities of daily living that may affect Patient Identification Verified: Yes risk of falls: Secondary Verification Process Completed: Yes Signs or symptoms of abuse/neglect since last visito No Patient Requires Transmission-Based Precautions: No Hospitalized since last visit: No Patient Has Alerts: Yes Implantable device outside of the clinic excluding No Patient Alerts: Patient on Blood Thinner cellular tissue based products placed in the center since last visit: Pain Present Now: No Electronic Signature(s) Signed: 03/01/2022 2:17:38 PM By: [name] EMT Entered By: [name] on 03/01/2022 14:17:37 -------------------------------------------------------------------------------- Encounter Discharge Information Details Patient Name: Date of Service: NA [name], [name]. 03/01/2022 8:00 A M Medical Record Number: [id number] Patient Account Number: [id number] Date of Birth/Sex: Treating RN: 14-Apr-1956 (66 y.o. [name] Primary Care [name]: [name] Other Clinician: [name] Referring Terrye Dombrosky: Treating Leitha Hyppolite/Extender: [name] Weeks in Treatment: 26 Encounter  Discharge Information Items Discharge Condition: Stable Ambulatory Status: Knee Scooter Discharge Destination: Home Transportation: Private Auto Accompanied By: Wife Schedule Follow-up Appointment: Yes Clinical Summary of Care: Electronic Signature(s) Signed: 03/01/2022 2:22:40 PM By: [name] EMT Entered By: [name] on 03/01/2022 14:22:40 -------------------------------------------------------------------------------- Vitals Details Patient Name: Date of Service: NA [name], [name] A. 03/01/2022 8:00 A M Medical Record Number: [id number] Patient Account Number: [id number] Date of Birth/Sex: Treating RN: 06/27/1955 (66 y.o. [name] Primary Care Tangia Pinard: [name] Other Clinician: [name] Referring Adolfo Granieri: Treating Ailah Barna/Extender: [name] Weeks in Treatment: 26 Vital Signs Time Taken: 08:33 Temperature (F): 97.5 Height (in): 74 Pulse (bpm): 83 Weight (lbs): 186 Respiratory Rate (breaths/min): 16 Body Mass Index (BMI): 23.9 Blood Pressure (mmHg): 134/75 Capillary Blood Glucose (mg/dl): 178 Reference Range: 80 - 120 mg / dl Electronic Signature(s) Signed: 03/01/2022 2:18:14 PM By: [name] EMT Entered By: [name] on 03/01/2022 14:18:14

## 2022-03-01 NOTE — Progress Notes (Signed)
OTHER, ATIENZA ([id number]) Visit Report for 03/01/2022 Problem List Details Patient Name: Date of Service: [name] 03/01/2022 8:00 A M Medical Record Number: [id number] Patient Account Number: [id number] Date of Birth/Sex: Treating RN: 10-01-1955 (66 y.o. 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 03/01/2022 Medical Record Number: [id number] Patient Account Number: [id number] Date of Birth/Sex: Treating RN: 07-18-1955 (66 y.o. 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 Facility Procedures CPT4 Code: 37858850 Description: G0277-(Facility Use Only) HBOT full body chamber, 62min , ICD-10 Diagnosis Description E11.621 Type 2 diabetes mellitus with foot ulcer 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 M86.672 Other chronic  osteomyelitis, left ankle and foot 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 Modifier: Quantity: 4 Physician Procedures : CPT4 Code Description Modifier [id number] - WC PHYS HYPERBARIC OXYGEN THERAPY ICD-10 Diagnosis Description E11.621 Type 2 diabetes mellitus with foot ulcer 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

## 2022-03-02 ENCOUNTER — Encounter (HOSPITAL_BASED_OUTPATIENT_CLINIC_OR_DEPARTMENT_OTHER): Payer: BC Managed Care – PPO | Admitting: General Surgery

## 2022-03-02 DIAGNOSIS — E11621 Type 2 diabetes mellitus with foot ulcer: Secondary | ICD-10-CM | POA: Diagnosis not present

## 2022-03-02 DIAGNOSIS — I2510 Atherosclerotic heart disease of native coronary artery without angina pectoris: Secondary | ICD-10-CM | POA: Diagnosis not present

## 2022-03-02 DIAGNOSIS — L97324 Non-pressure chronic ulcer of left ankle with necrosis of bone: Secondary | ICD-10-CM | POA: Diagnosis not present

## 2022-03-02 DIAGNOSIS — M86672 Other chronic osteomyelitis, left ankle and foot: Secondary | ICD-10-CM | POA: Diagnosis not present

## 2022-03-02 DIAGNOSIS — E11610 Type 2 diabetes mellitus with diabetic neuropathic arthropathy: Secondary | ICD-10-CM | POA: Diagnosis not present

## 2022-03-02 DIAGNOSIS — T8131XA Disruption of external operation (surgical) wound, not elsewhere classified, initial encounter: Secondary | ICD-10-CM | POA: Diagnosis not present

## 2022-03-02 LAB — GLUCOSE, CAPILLARY
Glucose-Capillary: 197 mg/dL — ABNORMAL HIGH (ref 70–99)
Glucose-Capillary: 130 mg/dL — ABNORMAL HIGH (ref 70–99)

## 2022-03-02 NOTE — Progress Notes (Signed)
[name], [name] ([id number]) Visit Report for 03/02/2022 Problem List Details Patient Name: Date of Service: NA [name] 03/02/2022 8:00 A M Medical Record Number: [id number] Patient Account Number: [id number] Date of Birth/Sex: Treating RN: [birth year] (66 y.o. 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 03/02/2022 Medical Record Number: [id number] Patient Account Number: [id number] Date of Birth/Sex: Treating RN: 1955/10/30 (66 y.o. 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 Facility Procedures CPT4 Code: [id number] Description: G0277-(Facility Use Only) HBOT full body chamber, 70min , ICD-10 Diagnosis Description E11.621 Type 2 diabetes mellitus with foot ulcer 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 M86.672 Other chronic  osteomyelitis, left ankle and foot 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 Modifier: Quantity: 4 Physician Procedures : CPT4 Code Description Modifier [id number] - WC PHYS HYPERBARIC OXYGEN THERAPY ICD-10 Diagnosis Description E11.621 Type 2 diabetes mellitus with foot ulcer 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

## 2022-03-02 NOTE — Progress Notes (Signed)
TYREE, FLUHARTY ([id number]) Visit Report for 03/02/2022 Arrival Information Details Patient Name: Date of Service: NA [name] 03/02/2022 8:00 A M Medical Record Number: [id number] Patient Account Number: [id number] Date of Birth/Sex: Treating RN: 11-Feb-1956 (66 y.o. [name] Primary Care Saphia Vanderford: [name] Other Clinician: [name] Referring Mikaele Stecher: Treating Kawehi Hostetter/Extender: [name] in Treatment: 27 Visit Information History Since Last Visit All ordered tests and consults were completed: Yes Patient Arrived: Knee Scooter Added or deleted any medications: No Arrival Time: 08:18 Any new allergies or adverse reactions: No Accompanied By: Wife Had a fall or experienced change in No Transfer Assistance: None activities of daily living that may affect Patient Identification Verified: Yes risk of falls: Secondary Verification Process Completed: Yes Signs or symptoms of abuse/neglect since last visito No Patient Requires Transmission-Based Precautions: No Hospitalized since last visit: No Patient Has Alerts: Yes Implantable device outside of the clinic excluding No Patient Alerts: Patient on Blood Thinner cellular tissue based products placed in the center since last visit: Pain Present Now: No Electronic Signature(s) Signed: 03/02/2022 1:24:13 PM By: [name] EMT Entered By: [name] on 03/02/2022 13:24:13 -------------------------------------------------------------------------------- Encounter Discharge Information Details Patient Name: Date of Service: NA [name] 03/02/2022 8:00 A M Medical Record Number: [id number] Patient Account Number: [id number] Date of Birth/Sex: Treating RN: 1956-04-15 (66 y.o. [name] Primary Care Alsie Younes: [name] Other Clinician: [name] Referring Quaran Kedzierski: Treating Amellia Panik/Extender: [name] Weeks in Treatment: 27 Encounter  Discharge Information Items Discharge Condition: Stable Ambulatory Status: Knee Scooter Discharge Destination: Home Transportation: Private Auto Accompanied By: Wife Schedule Follow-up Appointment: Yes Clinical Summary of Care: Electronic Signature(s) Signed: 03/02/2022 1:29:19 PM By: [name] EMT Entered By: [name] on 03/02/2022 13:29:18 -------------------------------------------------------------------------------- Vitals Details Patient Name: Date of Service: NA [name] 03/02/2022 8:00 A M Medical Record Number: [id number] Patient Account Number: [id number] Date of Birth/Sex: Treating RN: 29-May-1956 (66 y.o. [name] Primary Care Jmya Uliano: [name] Other Clinician: [name] Referring Miliano Cotten: Treating Woodrow Drab/Extender: [name] Weeks in Treatment: 27 Vital Signs Time Taken: 08:33 Temperature (F): 98.1 Height (in): 74 Pulse (bpm): 88 Weight (lbs): 186 Respiratory Rate (breaths/min): 16 Body Mass Index (BMI): 23.9 Blood Pressure (mmHg): 150/98 Capillary Blood Glucose (mg/dl): 197 Reference Range: 80 - 120 mg / dl Electronic Signature(s) Signed: 03/02/2022 1:25:49 PM By: [name] EMT Entered By: [name] on 03/02/2022 13:25:49

## 2022-03-02 NOTE — Progress Notes (Signed)
LINK, BURGESON ([id number]) Visit Report for 03/02/2022 HBO Details Patient Name: Date of Service: NA [name] 03/02/2022 8:00 A M Medical Record Number: [id number] Patient Account Number: [id number] Date of Birth/Sex: Treating RN: [birth year] (66 y.o. [name] Primary Care Taurean Ju: [name] Other Clinician: [name] Referring Aiza Vollrath: Treating Adalena Abdulla/Extender: [name]: 27 HBO Treatment Course Details Treatment Course Number: 1 Ordering Reathel Turi: [name] T Treatments Ordered: otal 120 HBO Treatment Start Date: 09/20/2021 HBO Indication: Diabetic Ulcer(s) of the Lower Extremity Standard/Conservative Wound Care tried and failed greater than or equal to 30 days HBO Treatment Details Treatment Number: 104 Patient Type: Outpatient Chamber Type: Monoplace Chamber Serial #: [id number] Treatment Protocol: 2.0 ATA with 90 minutes oxygen, with two 5 minute air breaks Treatment Details Compression Rate Down: 2.0 psi / minute De-Compression Rate Up: 2.0 psi / minute A breaks and breathing ir Compress Tx Pressure periods Decompress Decompress Begins Reached (leave unused spaces Begins Ends blank) Chamber Pressure (ATA 1 2 2 2 2 2  --2 1 ) Clock Time (24 hr) 08:40 08:52 09:22 09:27 09:57 10:02 - - 10:33 10:43 Treatment Length: 123 (minutes) Treatment Segments: 4 Vital Signs Capillary Blood Glucose Reference Range: 80 - 120 mg / dl HBO Diabetic Blood Glucose Intervention Range: <131 mg/dl or mg/dl Time Vitals Blood Respiratory Capillary Blood Glucose Pulse Action Type: Pulse: Temperature: Taken: Pressure: Rate: Glucose (mg/dl): Meter #: Oximetry (%) Taken: Pre 08:33 150/98 88 16 98.1 197 Post 10:46 158/96 74 18 130 Treatment Response Treatment Toleration: Well Treatment Completion Status: Treatment Completed without Adverse Event Additional Procedure Documentation Tissue Sevierity: Necrosis of  bone Physician HBO Attestation: I certify that I supervised this HBO treatment in accordance with Medicare guidelines. A trained emergency response team is readily available per Yes hospital policies and procedures. Continue HBOT as ordered. Yes Electronic Signature(s) Signed: 03/02/2022 1:57:48 PM By: 03/04/2022 MD FACS Previous Signature: 03/02/2022 1:28:02 PM Version By: 03/04/2022 EMT Entered By: [name] on 03/02/2022 13:57:48 -------------------------------------------------------------------------------- HBO Safety Checklist Details Patient Name: Date of Service: 03/04/2022 A. 03/02/2022 8:00 A M Medical Record Number: 03/04/2022 Patient Account Number: [id number] Date of Birth/Sex: Treating RN: [birth year] (66 y.o. 77 Primary Care Izreal Kock: [name] Other Clinician: [name] Referring Joshuah Minella: Treating Antoinne Spadaccini/Extender: [name]: 27 HBO Safety Checklist Items Safety Checklist Consent Form Signed Patient voided / foley secured and emptied When did you last eato 0630 Last dose of injectable or oral agent 0630 Ostomy pouch emptied and vented if applicable NA All implantable devices assessed, documented and approved NA Intravenous access site secured and place NA Valuables secured Linens and cotton and cotton/polyester blend (less than 51% polyester) Personal oil-based products / skin lotions / body lotions removed Wigs or hairpieces removed NA Smoking or tobacco materials removed Books / newspapers / magazines / loose paper removed Cologne, aftershave, perfume and deodorant removed Jewelry removed (may wrap wedding band) NA Make-up removed NA Hair care products removed Battery operated devices (external) removed Heating patches and chemical warmers removed Titanium eyewear removed NA Nail polish cured greater than 10 hours NA Casting material cured greater than 10  hours NA Hearing aids removed NA Loose dentures or partials removed NA Prosthetics have been removed NA Patient demonstrates correct use of air break device (if applicable) Patient concerns have been addressed Patient grounding bracelet on and cord attached to chamber Specifics for Inpatients (complete in  addition to above) Medication sheet sent with patient NA Intravenous medications needed or due during therapy sent with patient NA Drainage tubes (e.g. nasogastric tube or chest tube secured and vented) NA Endotracheal or Tracheotomy tube secured NA Cuff deflated of air and inflated with saline NA Airway suctioned NA Notes The safety checklist was done before the treatment was started. Electronic Signature(s) Signed: 03/02/2022 1:26:39 PM By: [name] EMT Entered By: [name] on 03/02/2022 13:26:39

## 2022-03-03 ENCOUNTER — Encounter (HOSPITAL_BASED_OUTPATIENT_CLINIC_OR_DEPARTMENT_OTHER): Payer: BC Managed Care – PPO | Admitting: General Surgery

## 2022-03-03 DIAGNOSIS — E11610 Type 2 diabetes mellitus with diabetic neuropathic arthropathy: Secondary | ICD-10-CM | POA: Diagnosis not present

## 2022-03-03 DIAGNOSIS — L97324 Non-pressure chronic ulcer of left ankle with necrosis of bone: Secondary | ICD-10-CM | POA: Diagnosis not present

## 2022-03-03 DIAGNOSIS — M86672 Other chronic osteomyelitis, left ankle and foot: Secondary | ICD-10-CM | POA: Diagnosis not present

## 2022-03-03 DIAGNOSIS — T8131XA Disruption of external operation (surgical) wound, not elsewhere classified, initial encounter: Secondary | ICD-10-CM | POA: Diagnosis not present

## 2022-03-03 DIAGNOSIS — E11621 Type 2 diabetes mellitus with foot ulcer: Secondary | ICD-10-CM | POA: Diagnosis not present

## 2022-03-03 DIAGNOSIS — I2510 Atherosclerotic heart disease of native coronary artery without angina pectoris: Secondary | ICD-10-CM | POA: Diagnosis not present

## 2022-03-03 LAB — GLUCOSE, CAPILLARY
Glucose-Capillary: 201 mg/dL — ABNORMAL HIGH (ref 70–99)
Glucose-Capillary: 318 mg/dL — ABNORMAL HIGH (ref 70–99)

## 2022-03-03 NOTE — Progress Notes (Signed)
[name], [name] ([id number]) Visit Report for 03/03/2022 Problem List Details Patient Name: Date of Service: [name] 03/03/2022 8:00 A M Medical Record Number: [id number] Patient Account Number: [id number] Date of Birth/Sex: Treating RN: Dec 10, 1955 (66 y.o. 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 03/03/2022 Medical Record Number: [id number] Patient Account Number: [id number] Date of Birth/Sex: Treating RN: 04/09/1956 (66 y.o. 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 Facility Procedures CPT4 Code: 88677373 Description: G0277-(Facility Use Only) HBOT full body chamber, 45min , ICD-10 Diagnosis Description E11.621 Type 2 diabetes mellitus with foot ulcer 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 M86.672 Other chronic  osteomyelitis, left ankle and foot 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 Modifier: Quantity: 4 Physician Procedures : CPT4 Code Description Modifier [id number] - WC PHYS HYPERBARIC OXYGEN THERAPY ICD-10 Diagnosis Description E11.621 Type 2 diabetes mellitus with foot ulcer 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

## 2022-03-03 NOTE — Progress Notes (Addendum)
LONI, ABDON ([id number]) Visit Report for 03/03/2022 Arrival Information Details Patient Name: Date of Service: NA [name] 03/03/2022 8:00 A M Medical Record Number: [id number] Patient Account Number: [id number] Date of Birth/Sex: Treating RN: 1956-04-10 (66 y.o. [name] Primary Care Avelino Herren: [name] Other Clinician: [name] Referring Shelanda Duvall: Treating Gardenia Witter/Extender: [name] in Treatment: 27 Visit Information History Since Last Visit All ordered tests and consults were completed: Yes Patient Arrived: Knee Scooter Added or deleted any medications: No Arrival Time: 07:59 Any new allergies or adverse reactions: No Accompanied By: Wife Had a fall or experienced change in No Transfer Assistance: None activities of daily living that may affect Patient Identification Verified: Yes risk of falls: Secondary Verification Process Completed: Yes Signs or symptoms of abuse/neglect since last visito No Patient Requires Transmission-Based Precautions: No Hospitalized since last visit: No Patient Has Alerts: Yes Implantable device outside of the clinic excluding No Patient Alerts: Patient on Blood Thinner cellular tissue based products placed in the center since last visit: Pain Present Now: No Electronic Signature(s) Signed: 03/03/2022 1:29:53 PM By: [name] EMT Entered By: [name] on 03/03/2022 13:29:53 -------------------------------------------------------------------------------- Encounter Discharge Information Details Patient Name: Date of Service: NA [name] 03/03/2022 8:00 A M Medical Record Number: [id number] Patient Account Number: [id number] Date of Birth/Sex: Treating RN: [birth year] (66 y.o. [name] Primary Care Laurann Mcmorris: [name] Other Clinician: [name] Referring Camden Knotek: Treating Kellianne Ek/Extender: [name] Weeks in Treatment: 27 Encounter Discharge  Information Items Discharge Condition: Stable Ambulatory Status: Knee Scooter Discharge Destination: Home Transportation: Private Auto Accompanied By: Wife Schedule Follow-up Appointment: Yes Clinical Summary of Care: Electronic Signature(s) Signed: 03/03/2022 1:35:31 PM By: [name] EMT Entered By: [name] on 03/03/2022 13:35:31 -------------------------------------------------------------------------------- Vitals Details Patient Name: Date of Service: NA RRO [name] [name] 03/03/2022 8:00 A M Medical Record Number: [id number] Patient Account Number: [id number] Date of Birth/Sex: Treating RN: Apr 16, 1956 (66 y.o. [name] Primary Care Tomothy Eddins: [name] Other Clinician: [name] Referring Araiya Tilmon: Treating Sabrina Arriaga/Extender: [name] Weeks in Treatment: 27 Vital Signs Time Taken: 08:25 Temperature (F): 97.5 Height (in): 74 Pulse (bpm): 83 Weight (lbs): 186 Respiratory Rate (breaths/min): 16 Body Mass Index (BMI): 23.9 Blood Pressure (mmHg): 142/79 Capillary Blood Glucose (mg/dl): 201 Reference Range: 80 - 120 mg / dl Electronic Signature(s) Signed: 03/03/2022 1:30:18 PM By: [name] EMT Entered By: [name] on 03/03/2022 13:30:18

## 2022-03-03 NOTE — Progress Notes (Addendum)
[name], [name] ([id number]) Visit Report for 03/03/2022 HBO Details Patient Name: Date of Service: [name] 03/03/2022 8:00 A M Medical Record Number: [id number] Patient Account Number: [id number] Date of Birth/Sex: Treating RN: [birth year] (66 y.o. [name] Primary Care Shawnice Tilmon: [name] Other Clinician: [name] Referring Bayyinah Dukeman: Treating Rudy Luhmann/Extender: [name] Weeks in Treatment: 27 HBO Treatment Course Details Treatment Course Number: 1 Ordering Kaylen Nghiem: [name] T Treatments Ordered: otal 120 HBO Treatment Start Date: 09/20/2021 HBO Indication: Diabetic Ulcer(s) of the Lower Extremity Standard/Conservative Wound Care tried and failed greater than or equal to 30 days HBO Treatment Details Treatment Number: 105 Patient Type: Outpatient Chamber Type: Monoplace Chamber Serial #: [id number] Treatment Protocol: 2.0 ATA with 90 minutes oxygen, with two 5 minute air breaks Treatment Details Compression Rate Down: 2.0 psi / minute De-Compression Rate Up: 2.0 psi / minute A breaks and breathing ir Compress Tx Pressure periods Decompress Decompress Begins Reached (leave unused spaces Begins Ends blank) Chamber Pressure (ATA 1 2 2 2 2 2  --2 1 ) Clock Time (24 hr) 08:31 08:45 09:16 09:21 09:51 09:56 - - 10:16 10:27 Treatment Length: 116 (minutes) Treatment Segments: 4 Vital Signs Capillary Blood Glucose Reference Range: 80 - 120 mg / dl HBO Diabetic Blood Glucose Intervention Range: <131 mg/dl or >249 mg/dl Time Vitals Blood Respiratory Capillary Blood Glucose Pulse Action Type: Pulse: Temperature: Taken: Pressure: Rate: Glucose (mg/dl): Meter #: Oximetry (%) Taken: Pre 08:25 142/79 83 16 97.5 201 Post 10:33 149/90 74 18 97.5 318 Treatment Response Treatment Toleration: Well Treatment Completion Status: Treatment Completed without Adverse Event Treatment Notes Dr. Celine Ahr informed of the patient blood sugar post  treatment of 318. Additional Procedure Documentation Tissue Sevierity: Necrosis of bone Physician HBO Attestation: I certify that I supervised this HBO treatment in accordance with Medicare guidelines. A trained emergency response team is readily available per Yes hospital policies and procedures. Continue HBOT as ordered. Yes Electronic Signature(s) Signed: 03/03/2022 3:02:11 PM By: [name] MD FACS Previous Signature: 03/03/2022 1:33:53 PM Version By: [name] EMT Entered By: [name] on 03/03/2022 15:02:10 -------------------------------------------------------------------------------- HBO Safety Checklist Details Patient Name: Date of Service: Colin Loveless A. 03/03/2022 8:00 A M Medical Record Number: [id number] Patient Account Number: [id number] Date of Birth/Sex: Treating RN: 1956/01/08 (66 y.o. [name] Primary Care Destaney Sarkis: [name] Other Clinician: [name] Referring Nusaybah Ivie: Treating Cecelia Graciano/Extender: [name] Weeks in Treatment: 27 HBO Safety Checklist Items Safety Checklist Consent Form Signed Patient voided / foley secured and emptied When did you last eato 0630 Last dose of injectable or oral agent 0630 Ostomy pouch emptied and vented if applicable NA All implantable devices assessed, documented and approved NA Intravenous access site secured and place NA Valuables secured Linens and cotton and cotton/polyester blend (less than 51% polyester) Personal oil-based products / skin lotions / body lotions removed Wigs or hairpieces removed NA Smoking or tobacco materials removed Books / newspapers / magazines / loose paper removed Cologne, aftershave, perfume and deodorant removed Jewelry removed (may wrap wedding band) NA Make-up removed NA Hair care products removed Battery operated devices (external) removed Heating patches and chemical warmers removed Titanium eyewear removed NA Nail polish  cured greater than 10 hours NA Casting material cured greater than 10 hours NA Hearing aids removed NA Loose dentures or partials removed NA Prosthetics have been removed NA Patient demonstrates correct use of air break device (if applicable) Patient concerns have been  addressed Patient grounding bracelet on and cord attached to chamber Specifics for Inpatients (complete in addition to above) Medication sheet sent with patient NA Intravenous medications needed or due during therapy sent with patient NA Drainage tubes (e.g. nasogastric tube or chest tube secured and vented) NA Endotracheal or Tracheotomy tube secured NA Cuff deflated of air and inflated with saline NA Airway suctioned NA Notes The safety checklist was done before the treatment was started. Electronic Signature(s) Signed: 03/03/2022 1:31:12 PM By: [name] EMT Entered By: [name] on 03/03/2022 13:31:11

## 2022-03-04 ENCOUNTER — Encounter (HOSPITAL_BASED_OUTPATIENT_CLINIC_OR_DEPARTMENT_OTHER): Payer: BC Managed Care – PPO | Admitting: General Surgery

## 2022-03-04 DIAGNOSIS — I2510 Atherosclerotic heart disease of native coronary artery without angina pectoris: Secondary | ICD-10-CM | POA: Diagnosis not present

## 2022-03-04 DIAGNOSIS — T8131XA Disruption of external operation (surgical) wound, not elsewhere classified, initial encounter: Secondary | ICD-10-CM | POA: Diagnosis not present

## 2022-03-04 DIAGNOSIS — E11610 Type 2 diabetes mellitus with diabetic neuropathic arthropathy: Secondary | ICD-10-CM | POA: Diagnosis not present

## 2022-03-04 DIAGNOSIS — E11621 Type 2 diabetes mellitus with foot ulcer: Secondary | ICD-10-CM | POA: Diagnosis not present

## 2022-03-04 DIAGNOSIS — L97324 Non-pressure chronic ulcer of left ankle with necrosis of bone: Secondary | ICD-10-CM | POA: Diagnosis not present

## 2022-03-04 DIAGNOSIS — M86672 Other chronic osteomyelitis, left ankle and foot: Secondary | ICD-10-CM | POA: Diagnosis not present

## 2022-03-04 LAB — GLUCOSE, CAPILLARY
Glucose-Capillary: 211 mg/dL — ABNORMAL HIGH (ref 70–99)
Glucose-Capillary: 156 mg/dL — ABNORMAL HIGH (ref 70–99)

## 2022-03-04 NOTE — Progress Notes (Signed)
LAVONTAE, CORNIA ([id number]) Visit Report for 03/04/2022 Arrival Information Details Patient Name: Date of Service: NA [name] 03/04/2022 10:45 A M Medical Record Number: [id number] Patient Account Number: [id number] Date of Birth/Sex: Treating RN: 26-Jun-1955 (66 y.o. [name] Primary Care Akyah Lagrange: [name] Other Clinician: Referring Lynsie Mcwatters: Treating Stephanny Tsutsui/Extender: [name] in Treatment: 27 Visit Information History Since Last Visit Added or deleted any medications: No Patient Arrived: Knee Scooter Any new allergies or adverse reactions: No Arrival Time: 10:59 Had a fall or experienced change in No Accompanied By: self activities of daily living that may affect Transfer Assistance: None risk of falls: Patient Identification Verified: Yes Signs or symptoms of abuse/neglect since last visito No Secondary Verification Process Completed: Yes Hospitalized since last visit: No Patient Requires Transmission-Based Precautions: No Implantable device outside of the clinic excluding No Patient Has Alerts: Yes cellular tissue based products placed in the center Patient Alerts: Patient on Blood Thinner since last visit: Has Dressing in Place as Prescribed: Yes Pain Present Now: No Electronic Signature(s) Signed: 03/04/2022 4:14:14 PM By: Samuella Bruin Entered By: Samuella Bruin on 03/04/2022 11:00:11 -------------------------------------------------------------------------------- Encounter Discharge Information Details Patient Name: Date of Service: [name] 03/04/2022 10:45 A M Medical Record Number: [id number] Patient Account Number: [id number] Date of Birth/Sex: Treating RN: 1956/05/24 (66 y.o. [name] Primary Care Aarti Mankowski: [name] Other Clinician: Referring Mykiah Schmuck: Treating Evelin Cake/Extender: [name] in Treatment: 27 Encounter Discharge Information Items Post  Procedure Vitals Discharge Condition: Stable Temperature (F): 98.2 Ambulatory Status: Knee Scooter Pulse (bpm): 77 Discharge Destination: Home Respiratory Rate (breaths/min): 16 Transportation: Private Auto Blood Pressure (mmHg): 166/88 Accompanied By: self Schedule Follow-up Appointment: Yes Clinical Summary of Care: Patient Declined Electronic Signature(s) Signed: 03/04/2022 4:14:14 PM By: Samuella Bruin Entered By: Samuella Bruin on 03/04/2022 11:53:27 -------------------------------------------------------------------------------- Lower Extremity Assessment Details Patient Name: Date of Service: [name] 03/04/2022 10:45 A M Medical Record Number: [id number] Patient Account Number: [id number] Date of Birth/Sex: Treating RN: 04-Nov-1955 (66 y.o. [name] Primary Care Virgil Slinger: [name] Other Clinician: Referring Haiden Rawlinson: Treating Lauralie Blacksher/Extender: [name]: 27 Edema Assessment Assessed: [Left: No] [Right: No] Edema: [Left: Ye] [Right: s] Calf Left: Right: Point of Measurement: From Medial Instep 35 cm Ankle Left: Right: Point of Measurement: From Medial Instep 22 cm Vascular Assessment Pulses: Dorsalis Pedis Palpable: [Left:Yes] Electronic Signature(s) Signed: 03/04/2022 4:14:14 PM By: Samuella Bruin Entered By: Samuella Bruin on 03/04/2022 11:08:33 -------------------------------------------------------------------------------- Multi Wound Chart Details Patient Name: Date of Service: [name] 03/04/2022 10:45 A M Medical Record Number: [id number] Patient Account Number: [id number] Date of Birth/Sex: Treating RN: 1955/12/30 (66 y.o. M) Primary Care Cassiopeia Florentino: [name] Other Clinician: Referring Caidance Sybert: Treating Makayela Secrest/Extender: [name]: 27 Vital Signs Height(in): 74 Capillary Blood Glucose(mg/dl): 408 Weight(lbs):  144 Pulse(bpm): 77 Body Mass Index(BMI): 23.9 Blood Pressure(mmHg): 166/88 Temperature(F): 98.2 Respiratory Rate(breaths/min): 16 Photos: [1:Left, Medial Foot] [N/A:N/A N/A] Wound Location: [1:Gradually Appeared] [N/A:N/A] Wounding Event: [1:Diabetic Wound/Ulcer of the Lower] [N/A:N/A] Primary Etiology: [1:Extremity Cataracts, Coronary Artery Disease, N/A] Comorbid History: [1:Hypertension, Myocardial Infarction, Peripheral Arterial Disease, Type II Diabetes, Osteomyelitis, Neuropathy 02/22/2021] [N/A:N/A] Date Acquired: [1:27] [N/A:N/A] Weeks of Treatment: [1:Open] [N/A:N/A] Wound Status: [1:No] [N/A:N/A] Wound Recurrence: [1:5.1x1.9x0.1] [N/A:N/A] Measurements L x W x D (cm) [1:7.611] [N/A:N/A] A (cm) : rea [1:0.761] [N/A:N/A] Volume (cm) : [1:46.20%] [N/A:N/A] % Reduction in A [1:rea:  94.60%] [N/A:N/A] % Reduction in Volume: [1:Grade 3] [N/A:N/A] Classification: [1:Medium] [N/A:N/A] Exudate A mount: [1:Serosanguineous] [N/A:N/A] Exudate Type: [1:red, brown] [N/A:N/A] Exudate Color: [1:Distinct, outline attached] [N/A:N/A] Wound Margin: [1:Large (67-100%)] [N/A:N/A] Granulation A mount: [1:Red, Hyper-granulation] [N/A:N/A] Granulation Quality: [1:Small (1-33%)] [N/A:N/A] Necrotic A mount: [1:Fat Layer (Subcutaneous Tissue): Yes N/A] Exposed Structures: [1:Fascia: No Tendon: No Muscle: No Joint: No Bone: No Medium (34-66%)] [N/A:N/A] Epithelialization: [1:Debridement - Selective/Open Wound N/A] Debridement: Pre-procedure Verification/Time Out 11:23 [N/A:N/A] Taken: [1:Lidocaine 5% topical ointment] [N/A:N/A] Pain Control: [1:Non-Viable Tissue] [N/A:N/A] Level: [1:4] [N/A:N/A] Debridement A (sq cm): [1:rea Curette] [N/A:N/A] Instrument: [1:Minimum] [N/A:N/A] Bleeding: [1:Pressure] [N/A:N/A] Hemostasis A chieved: [1:0] [N/A:N/A] Procedural Pain: [1:0] [N/A:N/A] Post Procedural Pain: [1:Procedure was tolerated well] [N/A:N/A] Debridement Treatment Response:  [1:5.1x1.9x0.1] [N/A:N/A] Post Debridement Measurements L x W x D (cm) [1:0.761] [N/A:N/A] Post Debridement Volume: (cm) [1:Cellular or Tissue Based Product] [N/A:N/A] Procedures Performed: [1:Debridement Negative Pressure Wound Therapy Maintenance (NPWT)] Treatment Notes Electronic Signature(s) Signed: 03/04/2022 11:43:23 AM By: [name] MD FACS Entered By: [name] on 03/04/2022 11:43:22 -------------------------------------------------------------------------------- Multi-Disciplinary Care Plan Details Patient Name: Date of Service: [name] 03/04/2022 10:45 A M Medical Record Number: [id number] Patient Account Number: [id number] Date of Birth/Sex: Treating RN: 04-14-1956 (66 y.o. [name] Primary Care Myrtha Tonkovich: [name] Other Clinician: Referring Labrenda Lasky: Treating Ashantia Amaral/Extender: [name] in Treatment: 27 Multidisciplinary Care Plan reviewed with physician Active Inactive HBO Nursing Diagnoses: Anxiety related to feelings of confinement associated with the hyperbaric oxygen chamber Anxiety related to knowledge deficit of hyperbaric oxygen therapy and treatment procedures Discomfort related to temperature and humidity changes inside hyperbaric chamber Potential for barotraumas to ears, sinuses, teeth, and lungs or cerebral gas embolism related to changes in atmospheric pressure inside hyperbaric oxygen chamber Potential for oxygen toxicity seizures related to delivery of 100% oxygen at an increased atmospheric pressure Potential for pulmonary oxygen toxicity related to delivery of 100% oxygen at an increased atmospheric pressure Goals: Barotrauma will be prevented during HBO2 Date Initiated: 09/17/2021 T arget Resolution Date: 04/08/2022 Goal Status: Active Patient will tolerate the hyperbaric oxygen therapy treatment Date Initiated: 09/17/2021 T arget Resolution Date: 04/08/2022 Goal Status:  Active Patient will tolerate the internal climate of the chamber Date Initiated: 09/17/2021 T arget Resolution Date: 04/08/2022 Goal Status: Active Patient/caregiver will verbalize understanding of HBO goals, rationale, procedures and potential hazards Date Initiated: 09/17/2021 T arget Resolution Date: 04/08/2022 Goal Status: Active Signs and symptoms of pulmonary oxygen toxicity will be recognized and promptly addressed Date Initiated: 09/17/2021 T arget Resolution Date: 04/08/2022 Goal Status: Active Signs and symptoms of seizure will be recognized and promptly addressed ; seizing patients will suffer no harm Date Initiated: 09/17/2021 T arget Resolution Date: 04/08/2022 Goal Status: Active Interventions: Administer decongestants, per physician orders, prior to HBO2 Administer the correct therapeutic gas delivery based on the patients needs and limitations, per physician order Assess and provide for patients comfort related to the hyperbaric environment and equalization of middle ear Assess for signs and symptoms related to adverse events, including but not limited to confinement anxiety, pneumothorax, oxygen toxicity and baurotrauma Assess patient for any history of confinement anxiety Assess patient's knowledge and expectations regarding hyperbaric medicine and provide education related to the hyperbaric environment, goals of treatment and prevention of adverse events Implement protocols to decrease risk of pneumothorax in high risk patients Notes: Nutrition Nursing Diagnoses: Impaired glucose control: actual or potential Goals: Patient/caregiver verbalizes understanding of need to maintain therapeutic glucose control per primary  care physician Date Initiated: 08/25/2021 Target Resolution Date: 04/08/2022 Goal Status: Active Interventions: Assess HgA1c results as ordered upon admission and as needed Provide education on elevated blood sugars and impact on wound  healing Notes: Osteomyelitis Nursing Diagnoses: Infection: osteomyelitis Knowledge deficit related to disease process and management Goals: Patient's osteomyelitis will resolve Date Initiated: 09/17/2021 Target Resolution Date: 04/08/2022 Goal Status: Active Interventions: Assess for signs and symptoms of osteomyelitis resolution every visit Provide education on osteomyelitis Treatment Activities: Surgical debridement : 09/17/2021 Systemic antibiotics : 09/17/2021 T ordered outside of clinic : 09/17/2021 est Notes: Electronic Signature(s) Signed: 03/04/2022 4:14:14 PM By: Samuella Bruin Entered By: Samuella Bruin on 03/04/2022 11:26:04 -------------------------------------------------------------------------------- Negative Pressure Wound Therapy Maintenance (NPWT) Details Patient Name: Date of Service: [name] 03/04/2022 10:45 A M Medical Record Number: [id number] Patient Account Number: [id number] Date of Birth/Sex: Treating RN: Feb 04, 1956 (66 y.o. [name] Primary Care Jestin Burbach: [name] Other Clinician: Referring Wana Mount: Treating Paden Senger/Extender: [name]: 27 NPWT Maintenance Performed for: Wound #1 Left, Medial Foot Performed By: Samuella Bruin, RN Type: VAC System Coverage Size (sq cm): 9.69 Pressure Type: Constant Pressure Setting: 100 mmHG Drain Type: None Sponge/Dressing Type: Foam, Black Date Initiated: 03/19/2021 Dressing Removed: Yes Quantity of Sponges/Gauze Removed: 2 Canister Changed: Yes Dressing Reapplied: Yes Quantity of Sponges/Gauze Inserted: 2 Days On NPWT : 351 Post Procedure Diagnosis Same as Pre-procedure Electronic Signature(s) Signed: 03/04/2022 4:14:14 PM By: Samuella Bruin Entered By: Samuella Bruin on 03/04/2022 11:26:56 -------------------------------------------------------------------------------- Pain Assessment Details Patient Name: Date of  Service: [name] 03/04/2022 10:45 A M Medical Record Number: [id number] Patient Account Number: [id number] Date of Birth/Sex: Treating RN: September 03, 1955 (66 y.o. [name] Primary Care Edan Serratore: [name] Other Clinician: Referring Aleeah Greeno: Treating Rmani Kellogg/Extender: [name]: 27 Active Problems Location of Pain Severity and Description of Pain Patient Has Paino No Site Locations Rate the pain. Rate the pain. Current Pain Level: 0 Pain Management and Medication Current Pain Management: Electronic Signature(s) Signed: 03/04/2022 4:14:14 PM By: Samuella Bruin Entered By: Samuella Bruin on 03/04/2022 11:00:43 -------------------------------------------------------------------------------- Patient/Caregiver Education Details Patient Name: Date of Service: NA [name] 9/22/2023andnbsp10:45 A M Medical Record Number: [id number] Patient Account Number: [id number] Date of Birth/Gender: Treating RN: [birth year] (66 y.o. [name] Primary Care Physician: [name] Other Clinician: Referring Physician: Treating Physician/Extender: [name] in Treatment: 27 Education Assessment Education Provided To: Patient Education Topics Provided Wound/Skin Impairment: Methods: Explain/Verbal Responses: Reinforcements needed, State content correctly Electronic Signature(s) Signed: 03/04/2022 4:14:14 PM By: Samuella Bruin Entered By: Samuella Bruin on 03/04/2022 11:26:22 -------------------------------------------------------------------------------- Wound Assessment Details Patient Name: Date of Service: NA [name] 03/04/2022 10:45 A M Medical Record Number: [id number] Patient Account Number: [id number] Date of Birth/Sex: Treating RN: Aug 26, 1955 (66 y.o. [name] Primary Care Donetta Isaza: [name] Other Clinician: Referring Jordy Hewins: Treating  Harmonii Karle/Extender: [name]: 27 Wound Status Wound Number: 1 Primary Diabetic Wound/Ulcer of the Lower Extremity Etiology: Wound Location: Left, Medial Foot Wound Open Wounding Event: Gradually Appeared Status: Date Acquired: 02/22/2021 Comorbid Cataracts, Coronary Artery Disease, Hypertension, Myocardial Weeks Of Treatment: 27 History: Infarction, Peripheral Arterial Disease, Type II Diabetes, Clustered Wound: No Osteomyelitis, Neuropathy Photos Wound Measurements Length: (cm) 5.1 Width: (cm) 1.9 Depth: (cm) 0.1 Area: (cm) 7.611 Volume: (cm) 0.761 % Reduction in Area: 46.2% % Reduction in Volume: 94.6% Epithelialization: Medium (34-66%) Tunneling:  No Undermining: No Wound Description Classification: Grade 3 Wound Margin: Distinct, outline attached Exudate Amount: Medium Exudate Type: Serosanguineous Exudate Color: red, brown Foul Odor After Cleansing: No Slough/Fibrino Yes Wound Bed Granulation Amount: Large (67-100%) Exposed Structure Granulation Quality: Red, Hyper-granulation Fascia Exposed: No Necrotic Amount: Small (1-33%) Fat Layer (Subcutaneous Tissue) Exposed: Yes Necrotic Quality: Adherent Slough Tendon Exposed: No Muscle Exposed: No Joint Exposed: No Bone Exposed: No Treatment Notes Wound #1 (Foot) Wound Laterality: Left, Medial Cleanser Wound Cleanser Discharge Instruction: Cleanse the wound with wound cleanser prior to applying a clean dressing using gauze sponges, not tissue or cotton balls. Peri-Wound Care Topical Primary Dressing Apligraf Discharge Instruction: DO NOT REMOVE Secondary Dressing Woven Gauze Sponge, Non-Sterile 4x4 in Discharge Instruction: In clinic. Home Health to apply wound vac. Secured With Elastic Bandage 4 inch (ACE bandage) Discharge Instruction: Secure with ACE bandage as directed. Kerlix Roll Sterile, 4.5x3.1 (in/yd) Discharge Instruction: In clinic. Home Health to apply wound  vac. 73M Medipore H Soft Cloth Surgical Tape, 4 x 10 (in/yd) Discharge Instruction: In clinic. Home Health to apply wound vac. Compression Wrap Compression Stockings Add-Ons Electronic Signature(s) Signed: 03/04/2022 4:14:14 PM By: Samuella BruinHerrington, Taylor Signed: 03/04/2022 4:59:13 PM By: Tommie ArdZochol, Jamie RN Entered By: Tommie ArdZochol, Jamie on 03/04/2022 11:10:42 -------------------------------------------------------------------------------- Vitals Details Patient Name: Date of Service: NA RRO Benard HalstedN, [name] 03/04/2022 10:45 A M Medical Record Number: [id number] Patient Account Number: [id number] Date of Birth/Sex: Treating RN: 1955/10/24 (66 y.o. Marlan PalauM) Herrington, Taylor Primary Care Sehaj Kolden: Simone CuriaLee, Keung Other Clinician: Referring Tresea Heine: Treating Deirdre Gryder/Extender: [name], Jennifer Lee, Keung Weeks in Treatment: 27 Vital Signs Time Taken: 10:57 Temperature (F): 98.2 Height (in): 74 Pulse (bpm): 77 Weight (lbs): 186 Respiratory Rate (breaths/min): 16 Body Mass Index (BMI): 23.9 Blood Pressure (mmHg): 166/88 Capillary Blood Glucose (mg/dl): 259156 Reference Range: 80 - 120 mg / dl Electronic Signature(s) Signed: 03/04/2022 4:14:14 PM By: Samuella BruinHerrington, Taylor Entered By: Samuella BruinHerrington, Taylor on 03/04/2022 11:00:37

## 2022-03-04 NOTE — Progress Notes (Addendum)
AASON, BENOIT ([id number]) Visit Report for 03/04/2022 HBO Details Patient Name: Date of Service: NA [name] 03/04/2022 8:00 A M Medical Record Number: [id number] Patient Account Number: [id number] Date of Birth/Sex: Treating RN: July 18, 1955 (66 y.o. [name] Primary Care Chelcea Zahn: [name] Other Clinician: [name] Referring Miangel Flom: Treating Kailo Kosik/Extender: [name] Weeks in Treatment: 27 HBO Treatment Course Details Treatment Course Number: 1 Ordering Beryle Bagsby: [name] T Treatments Ordered: otal 120 HBO Treatment Start Date: 09/20/2021 HBO Indication: Diabetic Ulcer(s) of the Lower Extremity Standard/Conservative Wound Care tried and failed greater than or equal to 30 days HBO Treatment Details Treatment Number: 106 Patient Type: Outpatient Chamber Type: Monoplace Chamber Serial #: [id number] Treatment Protocol: 2.0 ATA with 90 minutes oxygen, with two 5 minute air breaks Treatment Details Compression Rate Down: 2.0 psi / minute De-Compression Rate Up: 2.0 psi / minute A breaks and breathing ir Compress Tx Pressure periods Decompress Decompress Begins Reached (leave unused spaces Begins Ends blank) Chamber Pressure (ATA 1 2 2 2 2 2  --2 1 ) Clock Time (24 hr) 08:49 09:02 09:33 09:38 10:08 10:13 - - 10:43 10:55 Treatment Length: 126 (minutes) Treatment Segments: 4 Vital Signs Capillary Blood Glucose Reference Range: 80 - 120 mg / dl HBO Diabetic Blood Glucose Intervention Range: <131 mg/dl or >249 mg/dl Time Vitals Blood Respiratory Capillary Blood Glucose Pulse Action Type: Pulse: Temperature: Taken: Pressure: Rate: Glucose (mg/dl): Meter #: Oximetry (%) Taken: Pre 08:32 135/82 83 16 98.2 211 Post 12:57 166/88 72 18 98.2 156 Treatment Response Treatment Toleration: Well Treatment Completion Status: Treatment Completed without Adverse Event Additional Procedure Documentation Tissue Sevierity: Necrosis  of bone Physician HBO Attestation: I certify that I supervised this HBO treatment in accordance with Medicare guidelines. A trained emergency response team is readily available per Yes hospital policies and procedures. Continue HBOT as ordered. Yes Electronic Signature(s) Signed: 03/04/2022 11:54:54 AM By: [name] MD FACS Previous Signature: 03/04/2022 11:22:26 AM Version By: [name] EMT Entered By: [name] on 03/04/2022 11:54:53 -------------------------------------------------------------------------------- HBO Safety Checklist Details Patient Name: Date of Service: [name] 03/04/2022 8:00 A M Medical Record Number: [id number] Patient Account Number: [id number] Date of Birth/Sex: Treating RN: 1956-05-12 (66 y.o. [name] Primary Care Leisa Gault: [name] Other Clinician: [name] Referring Dewain Platz: Treating Demontray Franta/Extender: [name] Weeks in Treatment: 27 HBO Safety Checklist Items Safety Checklist Consent Form Signed Patient voided / foley secured and emptied When did you last eato 0645 Last dose of injectable or oral agent 0645 Ostomy pouch emptied and vented if applicable NA All implantable devices assessed, documented and approved NA Intravenous access site secured and place NA Valuables secured Linens and cotton and cotton/polyester blend (less than 51% polyester) Personal oil-based products / skin lotions / body lotions removed Wigs or hairpieces removed NA Smoking or tobacco materials removed Books / newspapers / magazines / loose paper removed Cologne, aftershave, perfume and deodorant removed Jewelry removed (may wrap wedding band) NA Make-up removed NA Hair care products removed Battery operated devices (external) removed Heating patches and chemical warmers removed Titanium eyewear removed NA Nail polish cured greater than 10 hours NA Casting material cured greater than 10  hours NA Hearing aids removed NA Loose dentures or partials removed NA Prosthetics have been removed NA Patient demonstrates correct use of air break device (if applicable) Patient concerns have been addressed Patient grounding bracelet on and cord attached to chamber Specifics for Inpatients (complete  in addition to above) Medication sheet sent with patient NA Intravenous medications needed or due during therapy sent with patient NA Drainage tubes (e.g. nasogastric tube or chest tube secured and vented) NA Endotracheal or Tracheotomy tube secured NA Cuff deflated of air and inflated with saline NA Airway suctioned NA Notes The safety checklist was done before the treatment was started. Electronic Signature(s) Signed: 03/04/2022 11:20:45 AM By: [name] EMT Entered By: [name] on 03/04/2022 11:20:44

## 2022-03-04 NOTE — Progress Notes (Signed)
MELBOURNE, JAKUBIAK ([id number]) Visit Report for 03/04/2022 Problem List Details Patient Name: Date of Service: NA [name] A. 03/04/2022 8:00 A M Medical Record Number: [id number] Patient Account Number: [id number] Date of Birth/Sex: Treating RN: Apr 30, 1956 (66 y.o. 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 03/04/2022 Medical Record Number: [id number] Patient Account Number: [id number] Date of Birth/Sex: Treating RN: 10-Mar-1956 (66 y.o. 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 Facility Procedures CPT4 Code: [id number] Description: G0277-(Facility Use Only) HBOT full body chamber, 99min , ICD-10 Diagnosis Description E11.621 Type 2 diabetes mellitus with foot ulcer 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 M86.672 Other chronic  osteomyelitis, left ankle and foot 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 Modifier: Quantity: 4 Physician Procedures : CPT4 Code Description Modifier [id number] - WC PHYS HYPERBARIC OXYGEN THERAPY ICD-10 Diagnosis Description E11.621 Type 2 diabetes mellitus with foot ulcer 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

## 2022-03-04 NOTE — Progress Notes (Addendum)
PATRICIO, POPWELL ([id number]) Visit Report for 03/04/2022 Arrival Information Details Patient Name: Date of Service: NA [name] 03/04/2022 8:00 A M Medical Record Number: [id number] Patient Account Number: [id number] Date of Birth/Sex: Treating RN: March 06, 1956 (67 y.o. [name] Primary Care Paiton [place]: [name] Other Clinician: [name] Referring Kollen Armenti: Treating Orby Tangen/Extender: [name] in Treatment: 27 Visit Information History Since Last Visit All ordered tests and consults were completed: Yes Patient Arrived: Knee Scooter Added or deleted any medications: No Arrival Time: 08:23 Any new allergies or adverse reactions: No Accompanied By: Wife Had a fall or experienced change in No Transfer Assistance: None activities of daily living that may affect Patient Identification Verified: Yes risk of falls: Secondary Verification Process Completed: Yes Signs or symptoms of abuse/neglect since last visito No Patient Requires Transmission-Based Precautions: No Hospitalized since last visit: No Patient Has Alerts: Yes Implantable device outside of the clinic excluding No Patient Alerts: Patient on Blood Thinner cellular tissue based products placed in the center since last visit: Pain Present Now: No Electronic Signature(s) Signed: 03/04/2022 11:18:57 AM By: [name] EMT Entered By: [name] on 03/04/2022 11:18:56 -------------------------------------------------------------------------------- Encounter Discharge Information Details Patient Name: Date of Service: NA [name] 03/04/2022 8:00 A M Medical Record Number: [id number] Patient Account Number: [id number] Date of Birth/Sex: Treating RN: [birth year] (66 y.o. [name] Primary Care Asiel Chrostowski: [name] Other Clinician: [name] Referring Kassadee Carawan: Treating Dallon Dacosta/Extender: [name] in Treatment:  27 Encounter Discharge Information Items Discharge Condition: Stable Ambulatory Status: Knee Scooter Discharge Destination: Other (Note Required) Transportation: Private Auto Accompanied By: Wife Schedule Follow-up Appointment: Yes Clinical Summary of Care: Notes The patient has a [facility] visit after his treatment today. Electronic Signature(s) Signed: 03/04/2022 11:24:37 AM By: [name] EMT Entered By: [name] on 03/04/2022 11:24:37 -------------------------------------------------------------------------------- Vitals Details Patient Name: Date of Service: NA RRO [name] [name] 03/04/2022 8:00 A M Medical Record Number: [id number] Patient Account Number: [id number] Date of Birth/Sex: Treating RN: 1955/12/13 (66 y.o. [name] Primary Care Brandin Stetzer: [name] Other Clinician: [name] Referring Seretha Estabrooks: Treating Andie Mungin/Extender: [name] Weeks in Treatment: 27 Vital Signs Time Taken: 08:32 Temperature (F): 98.2 Height (in): 74 Pulse (bpm): 83 Weight (lbs): 186 Respiratory Rate (breaths/min): 16 Body Mass Index (BMI): 23.9 Blood Pressure (mmHg): 135/82 Capillary Blood Glucose (mg/dl): 211 Reference Range: 80 - 120 mg / dl Electronic Signature(s) Signed: 03/04/2022 11:19:34 AM By: [name] EMT Entered By: [name] on 03/04/2022 11:19:33

## 2022-03-04 NOTE — Progress Notes (Signed)
REY, KUCZEK ([id number]) Visit Report for 03/04/2022 Chief Complaint Document Details Patient Name: Date of Service: NA [name] 03/04/2022 10:45 A M Medical Record Number: [id number] Patient Account Number: [id number] Date of Birth/Sex: Treating RN: 1955/09/12 (66 y.o. M) Primary Care Provider: [name] Other Clinician: Referring Provider: Treating Provider/Extender: [name] in Treatment: 27 Information Obtained from: Patient Chief Complaint Patients presents for treatment of an open diabetic ulcer with exposed bone and osteomyelitis Electronic Signature(s) Signed: 03/04/2022 11:43:41 AM By: [name] MD FACS Entered By: [name] on 03/04/2022 11:43:41 -------------------------------------------------------------------------------- Cellular or Tissue Based Product Details Patient Name: Date of Service: NA [name] 03/04/2022 10:45 A M Medical Record Number: [id number] Patient Account Number: [id number] Date of Birth/Sex: Treating RN: 1955/09/23 (66 y.o. [name] Primary Care Provider: [name] Other Clinician: Referring Provider: Treating Provider/Extender: [name] in Treatment: 27 Cellular or Tissue Based Product Type Wound #1 Left,Medial Foot Applied to: Performed By: Physician [name], MD Cellular or Tissue Based Product Type: Apligraf Level of Consciousness (Pre-procedure): Awake and Alert Pre-procedure Verification/Time Out Yes - 11:24 Taken: Location: genitalia / hands / feet / multiple digits Wound Size (sq cm): 9.69 Product Size (sq cm): 44 Waste Size (sq cm): 22 Waste Reason: wound size Amount of Product Applied (sq cm): 22 Instrument Used: Forceps, Scissors Lot #: [id number] Expiration Date: 03/09/2022 Fenestrated: Yes Instrument: Blade Reconstituted: Yes Solution Type: normal saline Solution Amount: 6 ml Lot #: [id number] Solution Expiration  Date: 03/13/2022 Secured: Yes Secured With: Steri-Strips Dressing Applied: Yes Primary Dressing: ADAPTIC Procedural Pain: 0 Post Procedural Pain: 0 Response to Treatment: Procedure was tolerated well Level of Consciousness (Post- Awake and Alert procedure): Post Procedure Diagnosis Same as Pre-procedure Notes scribed by Dr. Celine Ahr by Adline Peals, RN Electronic Signature(s) Signed: 03/04/2022 11:54:28 AM By: [name] MD FACS Signed: 03/04/2022 4:14:14 PM By: Adline Peals Entered By: Adline Peals on 03/04/2022 11:25:35 -------------------------------------------------------------------------------- Debridement Details Patient Name: Date of Service: NA [name] 03/04/2022 10:45 A M Medical Record Number: [id number] Patient Account Number: [id number] Date of Birth/Sex: Treating RN: [birth year] (66 y.o. [name] Primary Care Provider: [name] Other Clinician: Referring Provider: Treating Provider/Extender: [name] in Treatment: 27 Debridement Performed for Assessment: Wound #1 Left,Medial Foot Performed By: Physician [name], MD Debridement Type: Debridement Severity of Tissue Pre Debridement: Fat layer exposed Level of Consciousness (Pre-procedure): Awake and Alert Pre-procedure Verification/Time Out Yes - 11:23 Taken: Start Time: 11:23 Pain Control: Lidocaine 5% topical ointment T Area Debrided (L x W): otal 4 (cm) x 1 (cm) = 4 (cm) Tissue and other material debrided: Non-Viable, Biofilm Level: Non-Viable Tissue Debridement Description: Selective/Open Wound Instrument: Curette Bleeding: Minimum Hemostasis Achieved: Pressure Procedural Pain: 0 Post Procedural Pain: 0 Response to Treatment: Procedure was tolerated well Level of Consciousness (Post- Awake and Alert procedure): Post Debridement Measurements of Total Wound Length: (cm) 5.1 Width: (cm) 1.9 Depth: (cm) 0.1 Volume: (cm)  0.761 Character of Wound/Ulcer Post Debridement: Improved Severity of Tissue Post Debridement: Fat layer exposed Post Procedure Diagnosis Same as Pre-procedure Notes scribed for Dr. Celine Ahr by Adline Peals, RN Electronic Signature(s) Signed: 03/04/2022 11:54:28 AM By: [name] MD FACS Signed: 03/04/2022 4:14:14 PM By: Adline Peals Entered By: Adline Peals on 03/04/2022 11:23:56 -------------------------------------------------------------------------------- HPI Details Patient Name: Date of Service: NA RRO [name] [name] 03/04/2022 10:45 A M Medical Record Number: [id number] Patient Account Number:  [id number] Date of Birth/Sex: Treating RN: [birth year] (66 y.o. M) Primary Care Provider: [name] Other Clinician: Referring Provider: Treating Provider/Extender: [name] in Treatment: 27 History of Present Illness HPI Description: ADMISSION 08/25/2021 This is a 66 year old man who initially presented to his primary care provider in September 2022 with pain in his left foot. He was sent for an x-ray and while the x-ray was being performed, the tech pointed out a wound on his foot that the patient was not aware existed. He does have type 2 diabetes with significant neuropathy. His diabetes is suboptimally controlled with his most recent A1c being 8.5. He also has a history of coronary artery disease status post three- vessel CABG. he was initially seen by orthopedics, but they referred him to [facility]. He has undergone at least 7 operations/debridements and several applications of skin substitute under the care of podiatry. He has been in a wound VAC for much of this time. His most recent procedure was July 28, 2021. A portion of the talus was biopsied and was found to be consistent with osteomyelitis. Culture also returned positive for corynebacterium. He was seen on August 16, 2021 by infectious disease. A PICC line has  been placed and he will be receiving a 6-week course of IV daptomycin and cefepime. In October 2022, he underwent lower extremity vascular studies. Results are copied here: Right: Resting right ankle-brachial index is within normal range. No evidence of significant right lower extremity arterial disease. The right toe-brachial index is abnormal. Left: Resting left ankle-brachial index indicates mild left lower extremity arterial disease. The left toe-brachial index is abnormal. He has not been seen by vascular surgery despite these findings. He presented to clinic today in a cam boot and is using a knee scooter to offload. Wound VAC was in place. Once this was removed, a large ulcer was identified on the left midfoot/ankle. Bone is frankly exposed. There is no malodorous or purulent drainage. There is some granulation tissue over the central portion of the exposed bone. There is a tunnel that extends posteriorly for roughly 10 cm. It has been discussed with him by multiple providers that he is at very high risk of losing his lower leg because of this wound. He is extremely eager to avoid this outcome and is here today to review his options as well as receive ongoing wound care. 09/03/2021: Here for reevaluation of his wound. There does not appear to have been any substantial improvement overall since our last visit. He has been in a wound VAC with white foam overlying the exposed bone. We are working on getting him approved for hyperbaric oxygen therapy. 09/10/2021: We are in the process of getting him cleared to begin hyperbaric oxygen therapy. He still needs to obtain a chest x-ray. Although the wound measurements are roughly the same, I think the overall appearance of the wound is better. The exposed bone has a bit more granulation tissue covering it. He has not received a vascular surgery appointment to reevaluate his flow to the wound. 09/17/2021: He has been approved for hyperbaric oxygen therapy  and completed his chest x-ray, which I reviewed and it appears normal. The tunnels at the 12 and 10:00 positions are smaller. There is more granulation tissue covering the exposed bone and the undermining has decreased. He still has not received a vascular surgery appointment. 09/24/2021: He initiated hyperbaric oxygen therapy this week and is tolerating it well. He has an appointment with vascular  surgery coming up on May 16. The granulation tissue is covering more of the exposed bone and both tunnels are a bit smaller. 10/01/2021: He continues to tolerate hyperbaric oxygen therapy. He saw infectious disease and they are planning to pull his PICC line. He has been initiated on oral antibiotics (doxycycline and Augmentin). The wound looks about the same but the tunnels are a little bit smaller. The skin seems to be contracting somewhat around the exposed bone. 10/08/2021: The wound is still about the same size, but the tunnels continue to come in and the skin is contracting around the exposed bone. He continues to have some accumulation of necrotic material in the inferoposterior aspect of the wound as well as accumulation at the 12:00 tunnel area. 10/15/2021: The wound is smaller today. The tunnels continue to come in. There is less necrotic tissue present. He does have some periwound maceration. 10/22/2021: The wound is about the same size. There is a little bit less undermining at the distal portion. The exposed bone is dark and I am not sure if this is staining from silver nitrate or his VAC sponge or if it represents necrosis. The tunnels are shallower but he does have some serous drainage coming from the 10:00 tunnel. He continues to tolerate hyperbaric oxygen therapy well. 10/29/2021: The undermining continues to improve. The tunnels are about the same. He has good granulation tissue overlying the majority of the exposed bone. It does appear that perhaps the tubing from his wound VAC has been eroding  the skin at the 12 clock position. He continues to accumulate senescent epithelium around the borders of the wound. 11/05/2021: The undermining is almost completely resolved. The tunnels have contracted fairly significantly. No significant slough or debris accumulation. There is still senescent epithelium accumulation around the borders of the wound. He has been tolerating hyperbaric oxygen therapy well. 11/12/2021: Despite the measurements of the wound being about the same, the wound has changed in its shape and overall, I think it is improved. The undermining has resolved and the tunnels continue to shorten. There is good granulation tissue encroaching over the small area of bone that has remained exposed at the 12 o'clock position. Minimal slough accumulation. He continues to tolerate hyperbaric oxygen therapy well. 11/19/2021: I took a PCR culture last week. There was overgrowth of yeast. He is already taking suppressive doxycycline and Augmentin. I added fluconazole to his regimen. The wound is smaller again today. The tunnels continue to shorten. He continues to do well with hyperbaric oxygen therapy. 11/26/2021: For some reason, his foot has become macerated. The wound is narrower but about the same dimensions in its longitudinal aspect. The tunnels continue to shorten. He has some slough buildup on the wound as well as some heaped up senescent epithelium around the perimeter. 12/03/2021: No further maceration of his foot has occurred. The wound has contracted quite significantly from last week. The tunnel at 10:00 is closed. The tunnel at 12:00 is down to just a couple of millimeters. No other undermining is present. There is soft tissue coverage of the previously exposed bone. There is just a bit of slough and biofilm on the wound surface. 12/10/2021: The wound is looking good. It turns out the tunnel at 12:00 is only exposed when the patient dorsiflexes his foot. It is about 2 cm in depth when  he does this; when his foot is in plantarflexion, the tunnel is closed. The bone that was visible at the 12:00 tunnel is completely covered with  granulation tissue, but there does feel like some exposed bone deeper into the tunnel area. There is senescent skin heaped up around the periphery. Minimal slough on the wound surface. 12/16/2021: The wound dimensions are roughly the same. The surface has nice granulation tissue. The exposed bone at the 12:00 tunnel continues to be covered with more soft tissue. 7/14; patient's wound measures smaller today. Using the wound VAC with underlying collagen. He is also being treated with HBO for underlying osteomyelitis. He tells me he is on doxycycline and ampicillin follows with infectious disease next week 12/31/2021: The wound continues to contract. Unfortunately, the area where the track pad and tubing have been rubbing continues to look like it is applying friction. He says that the home health nurses that have been applying the [facility] have been putting gauze underneath the tubing, but nonetheless there is ongoing tissue breakdown at this site. Light slough accumulation on the wound surface. The tunnel continues to contract. He is tolerating HBO without difficulty. 01/07/2022: Bridging the wound VAC away from the ankle has resulted in significant improvement in the tissue at the apex of the wound. The tunnel is still present and is not all that much shorter, but the overall wound surface is very robust and healthy looking. Minimal slough accumulation. No concern for acute infection. 01/21/2022: The wound continues to contract and has a robust granulation tissue surface. The tunnel has come in considerably and is down to about 1.4 cm. There is still bone exposed within the tunnel but the rest of it is well covered. There is some senescent epithelium at the wound margins and a little bit of slough on the surface. 01/28/2022: No significant change in the wound this  week, but there has not been any reaccumulation of senescent epithelium or slough. The tunnel is perhaps a millimeter less in depth. He has been approved for Apligraf and we will apply this today. 02/04/2022: The wound has contracted somewhat and the tunnel has filled in completely. The wound surface is clean. He is here for Apligraf #2. 02/11/2022: The wound has contracted further and is now nearly flush with the surrounding skin surface. Light layer of slough. Apligraf #3 plan for today. 02/18/2022: There is a band of epithelium trying to cut across the superior portion of the wound. There is robust granulation tissue with just a light layer of slough and biofilm on the surface. There was a little bit of greenish drainage in the wound VAC but none appreciated on the site itself. He is here for Apligraf #4. 03/04/2022: The wound has contracted considerably. There is good granulation tissue on the surface. Minimal biofilm. He is here for Apligraf #5. Electronic Signature(s) Signed: 03/04/2022 11:46:30 AM By: [name] MD FACS Entered By: [name] on 03/04/2022 11:46:30 -------------------------------------------------------------------------------- Physical Exam Details Patient Name: Date of Service: NA [name] 03/04/2022 10:45 A M Medical Record Number: [id number] Patient Account Number: [id number] Date of Birth/Sex: Treating RN: 10-Mar-1956 (66 y.o. M) Primary Care Provider: [name] Other Clinician: Referring Provider: Treating Provider/Extender: [name] Weeks in Treatment: 27 Constitutional Hypertensive, asymptomatic. . . . No acute distress.Marland Kitchen Respiratory Normal work of breathing on room air.. Notes 03/04/2022: The wound has contracted considerably. There is good granulation tissue on the surface. Minimal biofilm Electronic Signature(s) Signed: 03/04/2022 11:47:00 AM By: [name] MD FACS Entered By: [name] on 03/04/2022  11:47:00 -------------------------------------------------------------------------------- Physician Orders Details Patient Name: Date of Service: [name], [name] 03/04/2022  10:45 A M Medical Record Number: [id number] Patient Account Number: [id number] Date of Birth/Sex: Treating RN: August 09, 1955 (66 y.o. 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 - Dr. Celine Ahr - room 2 Return A Nurse Visit: - in one week Anesthetic Wound #1 Left,Medial Foot (In clinic) Topical Lidocaine 5% applied to wound bed Cellular or Tissue Based Products Wound #1 Left,Medial Foot Cellular or Tissue Based Product Type: - Apligraf #5 Cellular or Tissue Based Product applied to wound bed, secured with steri-strips, cover with Adaptic or Mepitel. (DO NOT REMOVE). Bathing/ Shower/ Hygiene May shower with protection but do not get wound dressing(s) wet. Negative Presssure Wound Therapy Black Foam Other: - WOUND VAC CONTINUOUSLY AT 100MM/HG PRESSURE Edema Control - Lymphedema / SCD / Other Elevate legs to the level of the heart or above for 30 minutes daily and/or when sitting, a frequency of: Hyperbaric Oxygen Therapy Evaluate for HBO Therapy Indication: - Wagner 3 diabetic ulcer left foot If appropriate for treatment, begin HBOT per protocol: 2.5 ATA for 90 Minutes with 2 Five (5) Minute A Breaks ir Total Number of Treatments: -  12/31/2021 additional 40 treatments. T of 120 otal One treatments per day (delivered Monday through Friday unless otherwise specified in Special Instructions below): Finger stick Blood Glucose Pre- and Post- HBOT Treatment. Follow Hyperbaric Oxygen Glycemia Protocol A frin (Oxymetazoline HCL) 0.05% nasal spray - 1 spray in both nostrils daily as needed prior to HBO treatment for difficulty clearing ears Wound Treatment Wound #1 - Foot Wound Laterality: Left, Medial Cleanser: Wound Cleanser 1 x Per Week/30 Days Discharge Instructions: Cleanse the wound with wound cleanser prior to applying a clean dressing using gauze sponges, not tissue or cotton balls. Prim Dressing: Apligraf 1 x Per Week/30 Days ary Discharge Instructions: DO NOT REMOVE Secondary Dressing: Woven Gauze Sponge, Non-Sterile 4x4 in 1 x Per Week/30 Days Discharge Instructions: In clinic. Home Health to apply wound vac. Secured With: Elastic Bandage 4 inch (ACE bandage) 1 x Per Week/30 Days Discharge Instructions: Secure with ACE bandage as directed. Secured With: [employer], 4.5x3.1 (in/yd) 1 x Per Week/30 Days Discharge Instructions: In clinic. Home Health to apply wound vac. Secured With: 40M Medipore H Soft Cloth Surgical T ape, 4 x 10 (in/yd) 1 x Per Week/30 Days Discharge Instructions: In clinic. Home Health to apply wound vac. Patient Medications llergies: codeine A Notifications Medication Indication Start End 03/04/2022 lidocaine DOSE topical 5 % ointment - ointment topical GLYCEMIA INTERVENTIONS PROTOCOL PRE-HBO GLYCEMIA INTERVENTIONS ACTION INTERVENTION Obtain pre-HBO capillary blood glucose (ensure 1 physician order is in chart). A. Notify HBO physician and await physician orders. 2 If result is 70 mg/dl or below: B. If the result meets the hospital definition of a critical result, follow hospital policy. A. Give patient an 8 ounce Glucerna Shake, an 8 ounce Ensure, or 8 ounces of  a Glucerna/Ensure equivalent dietary supplement*. B. Wait 30 minutes. If result is 71 mg/dl to 130 mg/dl: C. Retest patients capillary blood glucose (CBG). D. If result greater than or equal to 110  mg/dl, proceed with HBO. If result less than 110 mg/dl, notify HBO physician and consider holding HBO. If result is 131 mg/dl to 249 mg/dl: A. Proceed with HBO. A. Notify HBO physician and await physician orders. B. It is recommended to hold HBO and do If result is 250 mg/dl or greater: blood/urine ketone testing. C. If the result meets the hospital definition of a critical result, follow hospital policy. POST-HBO GLYCEMIA INTERVENTIONS ACTION INTERVENTION Obtain post HBO capillary blood glucose (ensure 1 physician order is in chart). A. Notify HBO physician and await physician orders. 2 If result is 70 mg/dl or below: B. If the result meets the hospital definition of a critical result, follow hospital policy. A. Give patient an 8 ounce Glucerna Shake, an 8 ounce Ensure, or 8 ounces of a Glucerna/Ensure equivalent dietary supplement*. B. Wait 15 minutes for symptoms of If result is 71 mg/dl to 100 mg/dl: hypoglycemia (i.e. nervousness, anxiety, sweating, chills, clamminess, irritability, confusion, tachycardia or dizziness). C. If patient asymptomatic, discharge patient. If patient symptomatic, repeat capillary blood glucose (CBG) and notify HBO physician. If result is 101 mg/dl to 249 mg/dl: A. Discharge patient. A. Notify HBO physician and await physician orders. B. It is recommended to do blood/urine ketone If result is 250 mg/dl or greater: testing. C. If the result meets the hospital definition of a critical result, follow hospital policy. *Juice or candies are NOT equivalent products. If patient refuses the Glucerna or Ensure, please consult the hospital dietitian for an appropriate substitute. Electronic Signature(s) Signed: 03/04/2022 11:54:28 AM By: [name] MD FACS Entered By: [name] on 03/04/2022 11:47:20 -------------------------------------------------------------------------------- Problem List Details Patient Name: Date of Service: NA [name] 03/04/2022 10:45 A M Medical Record Number: [id number] Patient Account Number: [id number] Date of Birth/Sex: Treating RN: 01-31-1956 (66 y.o. 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 03/04/2022 10:45 A M Medical Record Number: [id number] Patient Account Number: [id number] Date of Birth/Sex: Treating RN: 04-07-1956 (66 y.o. M) Primary Care Provider: [name] Other Clinician: Referring Provider: Treating Provider/Extender: [name] in Treatment: 27 Subjective Chief Complaint Information obtained from Patient Patients presents for treatment of an open diabetic ulcer with exposed bone and osteomyelitis History of Present Illness (HPI) ADMISSION 08/25/2021 This is a 66 year old man who initially presented to his  primary care provider in September 2022 with pain in his left foot. He was sent for an x-ray and while the x-ray was being performed, the tech pointed out a wound on his foot that the patient was not aware existed. He does have type 2 diabetes with significant neuropathy. His diabetes is suboptimally controlled with his most recent A1c being 8.5. He also has a history of coronary artery disease status post three- vessel CABG. he was initially  seen by orthopedics, but they referred him to [facility]. He has undergone at least 7 operations/debridements and several applications of skin substitute under the care of podiatry. He has been in a wound VAC for much of this time. His most recent procedure was July 28, 2021. A portion of the talus was biopsied and was found to be consistent with osteomyelitis. Culture also returned positive for corynebacterium. He was seen on August 16, 2021 by infectious disease. A PICC line has been placed and he will be receiving a 6-week course of IV daptomycin and cefepime. In October 2022, he underwent lower extremity vascular studies. Results are copied here: Right: Resting right ankle-brachial index is within normal range. No evidence of significant right lower extremity arterial disease. The right toe-brachial index is abnormal. Left: Resting left ankle-brachial index indicates mild left lower extremity arterial disease. The left toe-brachial index is abnormal. He has not been seen by vascular surgery despite these findings. He presented to clinic today in a cam boot and is using a knee scooter to offload. Wound VAC was in place. Once this was removed, a large ulcer was identified on the left midfoot/ankle. Bone is frankly exposed. There is no malodorous or purulent drainage. There is some granulation tissue over the central portion of the exposed bone. There is a tunnel that extends posteriorly for roughly 10 cm. It has been discussed with him by  multiple providers that he is at very high risk of losing his lower leg because of this wound. He is extremely eager to avoid this outcome and is here today to review his options as well as receive ongoing wound care. 09/03/2021: Here for reevaluation of his wound. There does not appear to have been any substantial improvement overall since our last visit. He has been in a wound VAC with white foam overlying the exposed bone. We are working on getting him approved for hyperbaric oxygen therapy. 09/10/2021: We are in the process of getting him cleared to begin hyperbaric oxygen therapy. He still needs to obtain a chest x-ray. Although the wound measurements are roughly the same, I think the overall appearance of the wound is better. The exposed bone has a bit more granulation tissue covering it. He has not received a vascular surgery appointment to reevaluate his flow to the wound. 09/17/2021: He has been approved for hyperbaric oxygen therapy and completed his chest x-ray, which I reviewed and it appears normal. The tunnels at the 12 and 10:00 positions are smaller. There is more granulation tissue covering the exposed bone and the undermining has decreased. He still has not received a vascular surgery appointment. 09/24/2021: He initiated hyperbaric oxygen therapy this week and is tolerating it well. He has an appointment with vascular surgery coming up on May 16. The granulation tissue is covering more of the exposed bone and both tunnels are a bit smaller. 10/01/2021: He continues to tolerate hyperbaric oxygen therapy. He saw infectious disease and they are planning to pull his PICC line. He has been initiated on oral antibiotics (doxycycline and Augmentin). The wound looks about the same but the tunnels are a little bit smaller. The skin seems to be contracting somewhat around the exposed bone. 10/08/2021: The wound is still about the same size, but the tunnels continue to come in and the skin is  contracting around the exposed bone. He continues to have some accumulation of necrotic material in the inferoposterior aspect of the wound as well as accumulation at the  12:00 tunnel area. 10/15/2021: The wound is smaller today. The tunnels continue to come in. There is less necrotic tissue present. He does have some periwound maceration. 10/22/2021: The wound is about the same size. There is a little bit less undermining at the distal portion. The exposed bone is dark and I am not sure if this is staining from silver nitrate or his VAC sponge or if it represents necrosis. The tunnels are shallower but he does have some serous drainage coming from the 10:00 tunnel. He continues to tolerate hyperbaric oxygen therapy well. 10/29/2021: The undermining continues to improve. The tunnels are about the same. He has good granulation tissue overlying the majority of the exposed bone. It does appear that perhaps the tubing from his wound VAC has been eroding the skin at the 12 clock position. He continues to accumulate senescent epithelium around the borders of the wound. 11/05/2021: The undermining is almost completely resolved. The tunnels have contracted fairly significantly. No significant slough or debris accumulation. There is still senescent epithelium accumulation around the borders of the wound. He has been tolerating hyperbaric oxygen therapy well. 11/12/2021: Despite the measurements of the wound being about the same, the wound has changed in its shape and overall, I think it is improved. The undermining has resolved and the tunnels continue to shorten. There is good granulation tissue encroaching over the small area of bone that has remained exposed at the 12 o'clock position. Minimal slough accumulation. He continues to tolerate hyperbaric oxygen therapy well. 11/19/2021: I took a PCR culture last week. There was overgrowth of yeast. He is already taking suppressive doxycycline and Augmentin. I added  fluconazole to his regimen. The wound is smaller again today. The tunnels continue to shorten. He continues to do well with hyperbaric oxygen therapy. 11/26/2021: For some reason, his foot has become macerated. The wound is narrower but about the same dimensions in its longitudinal aspect. The tunnels continue to shorten. He has some slough buildup on the wound as well as some heaped up senescent epithelium around the perimeter. 12/03/2021: No further maceration of his foot has occurred. The wound has contracted quite significantly from last week. The tunnel at 10:00 is closed. The tunnel at 12:00 is down to just a couple of millimeters. No other undermining is present. There is soft tissue coverage of the previously exposed bone. There is just a bit of slough and biofilm on the wound surface. 12/10/2021: The wound is looking good. It turns out the tunnel at 12:00 is only exposed when the patient dorsiflexes his foot. It is about 2 cm in depth when he does this; when his foot is in plantarflexion, the tunnel is closed. The bone that was visible at the 12:00 tunnel is completely covered with granulation tissue, but there does feel like some exposed bone deeper into the tunnel area. There is senescent skin heaped up around the periphery. Minimal slough on the wound surface. 12/16/2021: The wound dimensions are roughly the same. The surface has nice granulation tissue. The exposed bone at the 12:00 tunnel continues to be covered with more soft tissue. 7/14; patient's wound measures smaller today. Using the wound VAC with underlying collagen. He is also being treated with HBO for underlying osteomyelitis. He tells me he is on doxycycline and ampicillin follows with infectious disease next week 12/31/2021: The wound continues to contract. Unfortunately, the area where the track pad and tubing have been rubbing continues to look like it is applying friction. He says that the  home health nurses that have been  applying the [facility] have been putting gauze underneath the tubing, but nonetheless there is ongoing tissue breakdown at this site. Light slough accumulation on the wound surface. The tunnel continues to contract. He is tolerating HBO without difficulty. 01/07/2022: Bridging the wound VAC away from the ankle has resulted in significant improvement in the tissue at the apex of the wound. The tunnel is still present and is not all that much shorter, but the overall wound surface is very robust and healthy looking. Minimal slough accumulation. No concern for acute infection. 01/21/2022: The wound continues to contract and has a robust granulation tissue surface. The tunnel has come in considerably and is down to about 1.4 cm. There is still bone exposed within the tunnel but the rest of it is well covered. There is some senescent epithelium at the wound margins and a little bit of slough on the surface. 01/28/2022: No significant change in the wound this week, but there has not been any reaccumulation of senescent epithelium or slough. The tunnel is perhaps a millimeter less in depth. He has been approved for Apligraf and we will apply this today. 02/04/2022: The wound has contracted somewhat and the tunnel has filled in completely. The wound surface is clean. He is here for Apligraf #2. 02/11/2022: The wound has contracted further and is now nearly flush with the surrounding skin surface. Light layer of slough. Apligraf #3 plan for today. 02/18/2022: There is a band of epithelium trying to cut across the superior portion of the wound. There is robust granulation tissue with just a light layer of slough and biofilm on the surface. There was a little bit of greenish drainage in the wound VAC but none appreciated on the site itself. He is here for Apligraf #4. 03/04/2022: The wound has contracted considerably. There is good granulation tissue on the surface. Minimal biofilm. He is here for Apligraf #5. Patient  History Information obtained from Patient. Family History Cancer - Father, Diabetes - Father,Mother,Paternal Grandparents, Heart Disease - Father, Hypertension - Father, No family history of Hereditary Spherocytosis, Kidney Disease, Lung Disease, Seizures, Stroke, Thyroid Problems, Tuberculosis. Social History Never smoker, Marital Status - Married, Alcohol Use - Rarely, Drug Use - No History, Caffeine Use - Daily. Medical History Eyes Patient has history of Cataracts - Removed 2008 Cardiovascular Patient has history of Coronary Artery Disease, Hypertension, Myocardial Infarction, Peripheral Arterial Disease Endocrine Patient has history of Type II Diabetes Musculoskeletal Patient has history of Osteomyelitis Neurologic Patient has history of Neuropathy Medical A Surgical History Notes nd Cardiovascular Hypercholesterolemia Abnormal EKG CABG X3 2019 Gastrointestinal GERD Musculoskeletal Diabetic foot ulcer Objective Constitutional Hypertensive, asymptomatic. No acute distress.. Vitals Time Taken: 10:57 AM, Height: 74 in, Weight: 186 lbs, BMI: 23.9, Temperature: 98.2 F, Pulse: 77 bpm, Respiratory Rate: 16 breaths/min, Blood Pressure: 166/88 mmHg, Capillary Blood Glucose: 156 mg/dl. Respiratory Normal work of breathing on room air.. General Notes: 03/04/2022: The wound has contracted considerably. There is good granulation tissue on the surface. Minimal biofilm Integumentary (Hair, Skin) Wound #1 status is Open. Original cause of wound was Gradually Appeared. The date acquired was: 02/22/2021. The wound has been in treatment 27 weeks. The wound is located on the Left,Medial Foot. The wound measures 5.1cm length x 1.9cm width x 0.1cm depth; 7.611cm^2 area and 0.761cm^3 volume. There is Fat Layer (Subcutaneous Tissue) exposed. There is no tunneling or undermining noted. There is a medium amount of serosanguineous drainage noted. The wound margin is distinct with  the outline attached  to the wound base. There is large (67-100%) red, hyper - granulation within the wound bed. There is a small (1- 33%) amount of necrotic tissue within the wound bed including Adherent Slough. 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 There was a Selective/Open Wound Non-Viable Tissue Debridement with a total area of 4 sq cm performed by [name], MD. With the following instrument(s): Curette to remove Non-Viable tissue/material. Material removed includes Biofilm after achieving pain control using Lidocaine 5% topical ointment. No specimens were taken. A time out was conducted at 11:23, prior to the start of the procedure. A Minimum amount of bleeding was controlled with Pressure. The procedure was tolerated well with a pain level of 0 throughout and a pain level of 0 following the procedure. Post Debridement Measurements: 5.1cm length x 1.9cm width x 0.1cm depth; 0.761cm^3 volume. Character of Wound/Ulcer Post Debridement is improved. Severity of Tissue Post Debridement is: Fat layer exposed. Post procedure Diagnosis Wound #1: Same as Pre-Procedure General Notes: scribed for Dr. Celine Ahr by Adline Peals, RN. Pre-procedure diagnosis of Wound #1 is a Diabetic Wound/Ulcer of the Lower Extremity located on the Left,Medial Foot. A skin graft procedure using a bioengineered skin substitute/cellular or tissue based product was performed by [name], MD with the following instrument(s): Forceps and  Scissors. Apligraf was applied and secured with Steri-Strips. 22 sq cm of product was utilized and 22 sq cm was wasted due to wound size. Post Application, ADAPTIC was applied. A Time Out was conducted at 11:24, prior to the start of the procedure. The procedure was tolerated well with a pain level of 0 throughout and a pain level of 0 following the procedure. Post procedure Diagnosis Wound #1: Same as Pre-Procedure General Notes: scribed by Dr. Celine Ahr by Adline Peals, RN. Plan Follow-up Appointments: Return Appointment in 2 weeks. - Dr. Celine Ahr - room 2 Nurse Visit: - in one week Anesthetic: Wound #1 Left,Medial Foot: (In clinic) Topical Lidocaine 5% applied to wound bed Cellular or Tissue Based Products: Wound #1 Left,Medial Foot: Cellular or Tissue Based Product Type: - Apligraf #5 Cellular or Tissue Based Product applied to wound bed, secured with steri-strips, cover with Adaptic or Mepitel. (DO NOT REMOVE). Bathing/ Shower/ Hygiene: May shower with protection but do not get wound dressing(s) wet. Negative Presssure Wound Therapy: Black Foam Other: - WOUND VAC CONTINUOUSLY AT 100MM/HG PRESSURE Edema Control - Lymphedema / SCD / Other: Elevate legs to the level of the heart or above for 30 minutes daily and/or when sitting, a frequency of: Hyperbaric Oxygen Therapy: Evaluate for HBO Therapy Indication: - Wagner 3 diabetic ulcer left foot If appropriate for treatment, begin HBOT per protocol: 2.5 ATA for 90 Minutes with 2 Five (5) Minute Air Breaks T Number of Treatments: - 12/31/2021 additional 40 treatments. T of 120 otal otal One treatments per day (delivered Monday through Friday unless otherwise specified in Special Instructions below): Finger stick Blood Glucose Pre- and Post- HBOT Treatment. Follow Hyperbaric Oxygen Glycemia Protocol Afrin (Oxymetazoline HCL) 0.05% nasal spray - 1 spray in both nostrils daily as needed prior  to HBO treatment for difficulty clearing  ears The following medication(s) was prescribed: lidocaine topical 5 % ointment ointment topical was prescribed at facility WOUND #1: - Foot Wound Laterality: Left, Medial Cleanser: Wound Cleanser 1 x Per Week/30 Days Discharge Instructions: Cleanse the wound with wound cleanser prior to applying a clean dressing using gauze sponges, not tissue or cotton balls. Prim Dressing: Apligraf 1 x Per Week/30 Days ary Discharge Instructions: DO NOT REMOVE Secondary Dressing: Woven Gauze Sponge, Non-Sterile 4x4 in 1 x Per Week/30 Days Discharge Instructions: In clinic. Home Health to apply wound vac. Secured With: Elastic Bandage 4 inch (ACE bandage) 1 x Per Week/30 Days Discharge Instructions: Secure with ACE bandage as directed. Secured With: [employer], 4.5x3.1 (in/yd) 1 x Per Week/30 Days Discharge Instructions: In clinic. Home Health to apply wound vac. Secured With: 58M Medipore H Soft Cloth Surgical T ape, 4 x 10 (in/yd) 1 x Per Week/30 Days Discharge Instructions: In clinic. Home Health to apply wound vac. 03/04/2022: The wound has contracted considerably. There is good granulation tissue on the surface. Minimal biofilm I used a curette to debride biofilm from the wound surface. I then fenestrated the Apligraf and applied it in standard fashion. It was secured in place using Adaptic and Steri-Strips. His wound VAC was reapplied over the top of this. He will continue his hyperbaric oxygen therapy treatments. He will have a nurse visit next week and follow-up with me in 2 weeks time. Electronic Signature(s) Signed: 03/04/2022 11:51:33 AM By: [name] MD FACS Entered By: [name] on 03/04/2022 11:51:33 -------------------------------------------------------------------------------- HxROS Details Patient Name: Date of Service: NA RRO [name] [name] 03/04/2022 10:45 A M Medical Record Number: [id number] Patient Account Number: [id number] Date of Birth/Sex: Treating  RN: 1956-01-05 (66 y.o. M) Primary Care Provider: [name] Other Clinician: Referring Provider: Treating Provider/Extender: [name] Weeks in Treatment: 27 Information Obtained From Patient Eyes Medical History: Positive for: Cataracts - Removed 2008 Cardiovascular Medical History: Positive for: Coronary Artery Disease; Hypertension; Myocardial Infarction; Peripheral Arterial Disease Past Medical History Notes: Hypercholesterolemia Abnormal EKG CABG X3 2019 Gastrointestinal Medical History: Past Medical History Notes: GERD Endocrine Medical History: Positive for: Type II Diabetes Time with diabetes: 24 years Treated with: Insulin, Oral agents Blood sugar tested every day: Yes Tested : Musculoskeletal Medical History: Positive for: Osteomyelitis Past Medical History Notes: Diabetic foot ulcer Neurologic Medical History: Positive for: Neuropathy HBO Extended History Items Eyes: Cataracts Immunizations Pneumococcal Vaccine: Received Pneumococcal Vaccination: Yes Received Pneumococcal Vaccination On or After 60th Birthday: Yes Implantable Devices Yes Family and Social History Cancer: Yes - Father; Diabetes: Yes - Father,Mother,Paternal Grandparents; Heart Disease: Yes - Father; Hereditary Spherocytosis: No; Hypertension: Yes - Father; Kidney Disease: No; Lung Disease: No; Seizures: No; Stroke: No; Thyroid Problems: No; Tuberculosis: No; Never smoker; Marital Status - Married; Alcohol Use: Rarely; Drug Use: No History; Caffeine Use: Daily; Financial Concerns: No; Food, Clothing or Shelter Needs: No; Support System Lacking: No; Transportation Concerns: No Electronic Signature(s) Signed: 03/04/2022 11:54:28 AM By: [name] MD FACS Entered By: [name] on 03/04/2022 11:46:37 -------------------------------------------------------------------------------- SuperBill Details Patient Name: Date of Service: NA [name]  03/04/2022 Medical Record Number: [id number] Patient Account Number: [id number] Date of Birth/Sex: Treating RN: [birth year] (66 y.o. 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 M86.672  Other chronic osteomyelitis, left ankle and foot E11.610 Type 2 diabetes mellitus with diabetic neuropathic arthropathy E11.621 Type 2 diabetes mellitus with foot ulcer I25.10 Atherosclerotic heart disease of native coronary artery without angina pectoris Facility Procedures CPT4 Code: 01751025 Description: (Facility Use Only) Apligraf 1 SQ CM Modifier: Quantity: 35 CPT4 Code: 85277824 Description: 23536 - SKIN SUB GRAFT FACE/NK/HF/G ICD-10 Diagnosis Description L97.324 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 Modifier: Quantity: 1 CPT4 Code: 14431540 Description: 08676 - WOUND VAC-50 SQ CM OR LESS Modifier: Quantity: 1 Physician Procedures : CPT4 Code Description Modifier [id number] - WC PHYS LEVEL 4 - EST PT ICD-10 Diagnosis Description L97.324 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

## 2022-03-05 DIAGNOSIS — T8131XA Disruption of external operation (surgical) wound, not elsewhere classified, initial encounter: Secondary | ICD-10-CM | POA: Diagnosis not present

## 2022-03-06 DIAGNOSIS — T8131XA Disruption of external operation (surgical) wound, not elsewhere classified, initial encounter: Secondary | ICD-10-CM | POA: Diagnosis not present

## 2022-03-07 ENCOUNTER — Encounter (HOSPITAL_BASED_OUTPATIENT_CLINIC_OR_DEPARTMENT_OTHER): Payer: BC Managed Care – PPO | Admitting: General Surgery

## 2022-03-07 DIAGNOSIS — E11610 Type 2 diabetes mellitus with diabetic neuropathic arthropathy: Secondary | ICD-10-CM | POA: Diagnosis not present

## 2022-03-07 DIAGNOSIS — M86672 Other chronic osteomyelitis, left ankle and foot: Secondary | ICD-10-CM | POA: Diagnosis not present

## 2022-03-07 DIAGNOSIS — I2510 Atherosclerotic heart disease of native coronary artery without angina pectoris: Secondary | ICD-10-CM | POA: Diagnosis not present

## 2022-03-07 DIAGNOSIS — E11621 Type 2 diabetes mellitus with foot ulcer: Secondary | ICD-10-CM | POA: Diagnosis not present

## 2022-03-07 DIAGNOSIS — L97324 Non-pressure chronic ulcer of left ankle with necrosis of bone: Secondary | ICD-10-CM | POA: Diagnosis not present

## 2022-03-07 DIAGNOSIS — T8131XA Disruption of external operation (surgical) wound, not elsewhere classified, initial encounter: Secondary | ICD-10-CM | POA: Diagnosis not present

## 2022-03-07 LAB — GLUCOSE, CAPILLARY
Glucose-Capillary: 203 mg/dL — ABNORMAL HIGH (ref 70–99)
Glucose-Capillary: 167 mg/dL — ABNORMAL HIGH (ref 70–99)

## 2022-03-07 NOTE — Progress Notes (Signed)
DESTYN, SCHUYLER ([id number]) Visit Report for 03/07/2022 Problem List Details Patient Name: Date of Service: [name] 03/07/2022 8:00 A M Medical Record Number: [id number] Patient Account Number: [id number] Date of Birth/Sex: Treating RN: 13-Nov-1955 (66 y.o. 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 09/21/2021 No Yes M86.672 Other chronic osteomyelitis, left ankle and foot 08/25/2021 No Yes E11.610 Type 2 diabetes mellitus with diabetic neuropathic arthropathy 08/25/2021 No Yes E11.621 Type 2 diabetes mellitus with foot ulcer 08/25/2021 No Yes I25.10 Atherosclerotic heart disease of native coronary artery without angina pectoris 08/25/2021 No Yes Inactive Problems Resolved Problems Electronic Signature(s) Signed: 03/07/2022 1:37:34 PM By: [name] EMT Signed: 03/07/2022 1:59:41 PM By: [name] MD FACS Entered By: [name] on 03/07/2022 13:37:34 -------------------------------------------------------------------------------- SuperBill Details Patient Name: Date of Service: NA [name] 03/07/2022 Medical Record Number: [id number] Patient Account Number: [id number] Date of Birth/Sex: Treating RN: 24-Oct-1955 (66 y.o. 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 M86.672 Other chronic osteomyelitis, left ankle and foot E11.610 Type 2 diabetes mellitus with diabetic neuropathic arthropathy E11.621 Type 2  diabetes mellitus with foot ulcer I25.10 Atherosclerotic heart disease of native coronary artery without angina pectoris Facility Procedures CPT4 Code: 85631497 Description: G0277-(Facility Use Only) HBOT full body chamber, 37min , ICD-10 Diagnosis Description E11.621 Type 2 diabetes mellitus with foot ulcer L97.324 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 M86.672 Other chronic  osteomyelitis, left ankle and foot E11.610 Type 2 diabetes mellitus with diabetic neuropathic arthropathy Modifier: Quantity: 4 Physician Procedures : CPT4 Code Description Modifier [id number] - WC PHYS HYPERBARIC OXYGEN THERAPY ICD-10 Diagnosis Description E11.621 Type 2 diabetes mellitus with foot ulcer L97.324 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

## 2022-03-07 NOTE — Progress Notes (Addendum)
LAVEL, RIEMAN ([id number]) Visit Report for 03/07/2022 HBO Details Patient Name: Date of Service: [name] 03/07/2022 8:00 A M Medical Record Number: [id number] Patient Account Number: [id number] Date of Birth/Sex: Treating RN: Feb 03, 1956 (66 y.o. [name], [name] Primary Care Colleene Swarthout: [name] Other Clinician: [name] Referring Sina Sumpter: Treating Ruqayya Ventress/Extender: [name] Weeks in Treatment: 27 HBO Treatment Course Details Treatment Course Number: 1 Ordering Calhoun Reichardt: [name] T Treatments Ordered: otal 120 HBO Treatment Start Date: 09/20/2021 HBO Indication: Diabetic Ulcer(s) of the Lower Extremity Standard/Conservative Wound Care tried and failed greater than or equal to 30 days HBO Treatment Details Treatment Number: 107 Patient Type: Outpatient Chamber Type: Monoplace Chamber Serial #: [id number] Treatment Protocol: 2.0 ATA with 90 minutes oxygen, with two 5 minute air breaks Treatment Details Compression Rate Down: 2.0 psi / minute De-Compression Rate Up: 2.0 psi / minute A breaks and breathing ir Compress Tx Pressure periods Decompress Decompress Begins Reached (leave unused spaces Begins Ends blank) Chamber Pressure (ATA 1 2 2 2 2 2  --2 1 ) Clock Time (24 hr) 08:50 08:06 08:36 09:41 10:12 10:17 - - 10:47 10:56 Treatment Length: 126 (minutes) Treatment Segments: 4 Vital Signs Capillary Blood Glucose Reference Range: 80 - 120 mg / dl HBO Diabetic Blood Glucose Intervention Range: <131 mg/dl or >249 mg/dl Time Vitals Blood Respiratory Capillary Blood Glucose Pulse Action Type: Pulse: Temperature: Taken: Pressure: Rate: Glucose (mg/dl): Meter #: Oximetry (%) Taken: Pre 08:42 156/94 79 16 97.5 203 Post 11:02 179/95 74 18 97.5 167 Treatment Response Treatment Toleration: Well Treatment Completion Status: Treatment Completed without Adverse Event Additional Procedure Documentation Tissue Sevierity: Necrosis  of bone Physician HBO Attestation: I certify that I supervised this HBO treatment in accordance with Medicare guidelines. A trained emergency response team is readily available per Yes hospital policies and procedures. Continue HBOT as ordered. Yes Electronic Signature(s) Signed: 03/07/2022 2:00:10 PM By: [name] MD FACS Previous Signature: 03/07/2022 1:37:01 PM Version By: [name] EMT Entered By: [name] on 03/07/2022 14:00:10 -------------------------------------------------------------------------------- HBO Safety Checklist Details Patient Name: Date of Service: Colin Loveless A. 03/07/2022 8:00 A M Medical Record Number: [id number] Patient Account Number: [id number] Date of Birth/Sex: Treating RN: 07-30-1955 (66 y.o. [name], [name] Primary Care Guiliana Shor: [name] Other Clinician: [name] Referring Brain Honeycutt: Treating Justus Duerr/Extender: [name] Weeks in Treatment: 27 HBO Safety Checklist Items Safety Checklist Consent Form Signed Patient voided / foley secured and emptied When did you last eato 0610 Last dose of injectable or oral agent 0610 Ostomy pouch emptied and vented if applicable NA All implantable devices assessed, documented and approved NA Intravenous access site secured and place NA Valuables secured Linens and cotton and cotton/polyester blend (less than 51% polyester) Personal oil-based products / skin lotions / body lotions removed Wigs or hairpieces removed NA Smoking or tobacco materials removed Books / newspapers / magazines / loose paper removed Cologne, aftershave, perfume and deodorant removed Jewelry removed (may wrap wedding band) NA Make-up removed NA Hair care products removed Battery operated devices (external) removed Heating patches and chemical warmers removed Titanium eyewear removed NA Nail polish cured greater than 10 hours NA Casting material cured greater than 10  hours NA Hearing aids removed NA Loose dentures or partials removed NA Prosthetics have been removed NA Patient demonstrates correct use of air break device (if applicable) Patient concerns have been addressed Patient grounding bracelet on and cord attached to chamber Specifics for Inpatients (complete  in addition to above) Medication sheet sent with patient NA Intravenous medications needed or due during therapy sent with patient NA Drainage tubes (e.g. nasogastric tube or chest tube secured and vented) NA Endotracheal or Tracheotomy tube secured NA Cuff deflated of air and inflated with saline NA Airway suctioned NA Notes The safety checklist was done before the treatment was started. Electronic Signature(s) Signed: 03/07/2022 1:35:50 PM By: [name] EMT Entered By: [name] on 03/07/2022 13:35:49

## 2022-03-07 NOTE — Progress Notes (Addendum)
[name], KOPS ([id number]) Visit Report for 03/07/2022 Arrival Information Details Patient Name: Date of Service: NA [name] 03/07/2022 8:00 A M Medical Record Number: [id number] Patient Account Number: [id number] Date of Birth/Sex: Treating RN: 1956-03-21 (66 y.o. [name], [name] Primary Care Eston Heslin: [name] Other Clinician: [name] Referring Kinjal Neitzke: Treating Nocholas Damaso/Extender: [name] in Treatment: 27 Visit Information History Since Last Visit All ordered tests and consults were completed: Yes Patient Arrived: Knee Scooter Added or deleted any medications: No Arrival Time: 08:26 Any new allergies or adverse reactions: No Accompanied By: Wife Had a fall or experienced change in No Transfer Assistance: None activities of daily living that may affect Patient Identification Verified: Yes risk of falls: Secondary Verification Process Completed: Yes Signs or symptoms of abuse/neglect since last visito No Patient Requires Transmission-Based Precautions: No Hospitalized since last visit: No Patient Has Alerts: Yes Implantable device outside of the clinic excluding No Patient Alerts: Patient on Blood Thinner cellular tissue based products placed in the center since last visit: Pain Present Now: No Electronic Signature(s) Signed: 03/07/2022 1:34:18 PM By: [name] EMT Entered By: [name] on 03/07/2022 13:34:18 -------------------------------------------------------------------------------- Encounter Discharge Information Details Patient Name: Date of Service: NA [name] 03/07/2022 8:00 A M Medical Record Number: [id number] Patient Account Number: [id number] Date of Birth/Sex: Treating RN: 1955/08/04 (66 y.o. [name], [name] Primary Care Lorilee Cafarella: [name] Other Clinician: [name] Referring Deshunda Thackston: Treating Magaline Steinberg/Extender: [name] Weeks in Treatment: 27 Encounter  Discharge Information Items Discharge Condition: Stable Ambulatory Status: Knee Scooter Discharge Destination: Home Transportation: Private Auto Accompanied By: Wife Schedule Follow-up Appointment: Yes Clinical Summary of Care: Electronic Signature(s) Signed: 03/07/2022 1:38:55 PM By: [name] EMT Entered By: [name] on 03/07/2022 13:38:55 -------------------------------------------------------------------------------- Vitals Details Patient Name: Date of Service: NA [name] 03/07/2022 8:00 A M Medical Record Number: [id number] Patient Account Number: [id number] Date of Birth/Sex: Treating RN: December 08, 1955 (65 y.o. [name], [name] Primary Care Akaiya Touchette: [name] Other Clinician: [name] Referring Siddhanth Denk: Treating Keina Mutch/Extender: [name] Weeks in Treatment: 27 Vital Signs Time Taken: 08:42 Temperature (F): 97.5 Height (in): 74 Pulse (bpm): 79 Weight (lbs): 186 Respiratory Rate (breaths/min): 16 Body Mass Index (BMI): 23.9 Blood Pressure (mmHg): 156/94 Capillary Blood Glucose (mg/dl): 203 Reference Range: 80 - 120 mg / dl Electronic Signature(s) Signed: 03/07/2022 1:34:45 PM By: [name] EMT Entered By: [name] on 03/07/2022 13:34:45

## 2022-03-08 ENCOUNTER — Encounter (HOSPITAL_BASED_OUTPATIENT_CLINIC_OR_DEPARTMENT_OTHER): Payer: BC Managed Care – PPO | Admitting: General Surgery

## 2022-03-08 DIAGNOSIS — T8131XA Disruption of external operation (surgical) wound, not elsewhere classified, initial encounter: Secondary | ICD-10-CM | POA: Diagnosis not present

## 2022-03-09 ENCOUNTER — Encounter (HOSPITAL_BASED_OUTPATIENT_CLINIC_OR_DEPARTMENT_OTHER): Payer: BC Managed Care – PPO | Admitting: General Surgery

## 2022-03-09 DIAGNOSIS — L97324 Non-pressure chronic ulcer of left ankle with necrosis of bone: Secondary | ICD-10-CM | POA: Diagnosis not present

## 2022-03-09 DIAGNOSIS — I2510 Atherosclerotic heart disease of native coronary artery without angina pectoris: Secondary | ICD-10-CM | POA: Diagnosis not present

## 2022-03-09 DIAGNOSIS — E11610 Type 2 diabetes mellitus with diabetic neuropathic arthropathy: Secondary | ICD-10-CM | POA: Diagnosis not present

## 2022-03-09 DIAGNOSIS — E11621 Type 2 diabetes mellitus with foot ulcer: Secondary | ICD-10-CM | POA: Diagnosis not present

## 2022-03-09 DIAGNOSIS — T8131XA Disruption of external operation (surgical) wound, not elsewhere classified, initial encounter: Secondary | ICD-10-CM | POA: Diagnosis not present

## 2022-03-09 DIAGNOSIS — M86672 Other chronic osteomyelitis, left ankle and foot: Secondary | ICD-10-CM | POA: Diagnosis not present

## 2022-03-09 LAB — GLUCOSE, CAPILLARY
Glucose-Capillary: 278 mg/dL — ABNORMAL HIGH (ref 70–99)
Glucose-Capillary: 237 mg/dL — ABNORMAL HIGH (ref 70–99)

## 2022-03-09 NOTE — Progress Notes (Addendum)
[name], APO ([id number]) Visit Report for 03/09/2022 HBO Details Patient Name: Date of Service: NA Colin Nephew A. 03/09/2022 8:00 A M Medical Record Number: [id number] Patient Account Number: [id number] Date of Birth/Sex: Treating RN: 10-28-1955 (66 y.o. [name], [name] Primary Care [name]: [name] Other Clinician: [name] Referring [name]: Treating [name]: [name] Weeks in Treatment: 28 HBO Treatment Course Details Treatment Course Number: 1 Ordering [name]: [name] T Treatments Ordered: otal 120 HBO Treatment Start Date: 09/20/2021 HBO Indication: Diabetic Ulcer(s) of the Lower Extremity Standard/Conservative Wound Care tried and failed greater than or equal to 30 days HBO Treatment Details Treatment Number: 108 Patient Type: Outpatient Chamber Type: Monoplace Chamber Serial #: [id number] Treatment Protocol: 2.0 ATA with 90 minutes oxygen, with two 5 minute air breaks Treatment Details Compression Rate Down: 2.0 psi / minute De-Compression Rate Up: 2.0 psi / minute A breaks and breathing ir Compress Tx Pressure periods Decompress Decompress Begins Reached (leave unused spaces Begins Ends blank) Chamber Pressure (ATA 1 2 2 2 2 2  --2 1 ) Clock Time (24 hr) 08:40 08:53 09:23 09:28 09:58 10:03 - - 10:33 10:45 Treatment Length: 125 (minutes) Treatment Segments: 4 Vital Signs Capillary Blood Glucose Reference Range: 80 - 120 mg / dl HBO Diabetic Blood Glucose Intervention Range: <131 mg/dl or mg/dl Time Vitals Blood Respiratory Capillary Blood Glucose Pulse Action Type: Pulse: Temperature: Taken: Pressure: Rate: Glucose (mg/dl): Meter #: Oximetry (%) Taken: Pre 08:33 139/85 81 16 97.5 278 Post 10:47 172/92 68 18 97.2 237 Treatment Response Treatment Toleration: Well Treatment Completion Status: Treatment Completed without Adverse Event Additional Procedure Documentation Tissue Sevierity: Necrosis of  bone Physician HBO Attestation: I certify that I supervised this HBO treatment in accordance with Medicare guidelines. A trained emergency response team is readily available per Yes hospital policies and procedures. Continue HBOT as ordered. Yes Electronic Signature(s) Signed: 03/09/2022 3:46:20 PM By: 03/11/2022 MD FACS Previous Signature: 03/09/2022 1:49:02 PM Version By: 03/11/2022 EMT Entered By: [name] on 03/09/2022 15:46:19 -------------------------------------------------------------------------------- HBO Safety Checklist Details Patient Name: Date of Service: 03/11/2022 A. 03/09/2022 8:00 A M Medical Record Number: 03/11/2022 Patient Account Number: [id number] Date of Birth/Sex: Treating RN: [birth year] (66 y.o. 79, [name] Primary Care [name]: [name] Other Clinician: [name] Referring [name]: Treating Miamor Ayler/Extender: [name] Weeks in Treatment: 28 HBO Safety Checklist Items Safety Checklist Consent Form Signed Patient voided / foley secured and emptied When did you last eato 0615 Last dose of injectable or oral agent 0615 Ostomy pouch emptied and vented if applicable NA All implantable devices assessed, documented and approved NA Intravenous access site secured and place NA Valuables secured Linens and cotton and cotton/polyester blend (less than 51% polyester) Personal oil-based products / skin lotions / body lotions removed Wigs or hairpieces removed NA Smoking or tobacco materials removed Books / newspapers / magazines / loose paper removed Cologne, aftershave, perfume and deodorant removed Jewelry removed (may wrap wedding band) NA Make-up removed NA Hair care products removed Battery operated devices (external) removed Heating patches and chemical warmers removed Titanium eyewear removed NA Nail polish cured greater than 10 hours NA Casting material cured greater than 10  hours NA Hearing aids removed NA Loose dentures or partials removed NA Prosthetics have been removed NA Patient demonstrates correct use of air break device (if applicable) Patient concerns have been addressed Patient grounding bracelet on and cord attached to chamber Specifics for Inpatients (complete  in addition to above) Medication sheet sent with patient NA Intravenous medications needed or due during therapy sent with patient NA Drainage tubes (e.g. nasogastric tube or chest tube secured and vented) NA Endotracheal or Tracheotomy tube secured NA Cuff deflated of air and inflated with saline NA Airway suctioned NA Notes The safety checklist was done before the treatment was started. Electronic Signature(s) Signed: 03/09/2022 1:47:16 PM By: [name] EMT Entered By: [name] on 03/09/2022 13:47:15

## 2022-03-09 NOTE — Progress Notes (Signed)
PHARES, ZACCONE ([id number]) Visit Report for 03/09/2022 Problem List Details Patient Name: Date of Service: [name] 03/09/2022 8:00 A M Medical Record Number: [id number] Patient Account Number: [id number] Date of Birth/Sex: Treating RN: 1956/05/08 (66 y.o. 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 03/09/2022 Medical Record Number: [id number] Patient Account Number: [id number] Date of Birth/Sex: Treating RN: 11/25/1955 (66 y.o. 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 E11.621 Type 2 diabetes  mellitus with foot ulcer I25.10 Atherosclerotic heart disease of native coronary artery without angina pectoris Facility Procedures CPT4 Code: [id number] Description: G0277-(Facility Use Only) HBOT full body chamber, 28min , ICD-10 Diagnosis Description E11.621 Type 2 diabetes mellitus with foot ulcer 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 M86.672 Other chronic  osteomyelitis, left ankle and foot 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 Modifier: Quantity: 4 Physician Procedures : CPT4 Code Description Modifier [id number] - WC PHYS HYPERBARIC OXYGEN THERAPY ICD-10 Diagnosis Description E11.621 Type 2 diabetes mellitus with foot ulcer 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

## 2022-03-09 NOTE — Progress Notes (Addendum)
Colin, [name] ([id number]) Visit Report for 03/09/2022 Arrival Information Details Patient Name: Date of Service: NA [name] 03/09/2022 8:00 A M Medical Record Number: [id number] Patient Account Number: [id number] Date of Birth/Sex: Treating RN: 04-02-1956 (66 y.o. [name], Tammi Klippel Primary Care Hason Ofarrell: [name] Other Clinician: [name] Referring Shonya Sumida: Treating Clio Gerhart/Extender: [name] in Treatment: 28 Visit Information History Since Last Visit All ordered tests and consults were completed: Yes Patient Arrived: Knee Scooter Added or deleted any medications: No Arrival Time: 08:19 Any new allergies or adverse reactions: No Accompanied By: Wife Had a fall or experienced change in No Transfer Assistance: None activities of daily living that may affect Patient Identification Verified: Yes risk of falls: Secondary Verification Process Completed: Yes Signs or symptoms of abuse/neglect since last visito No Patient Requires Transmission-Based Precautions: No Hospitalized since last visit: No Patient Has Alerts: Yes Implantable device outside of the clinic excluding No Patient Alerts: Patient on Blood Thinner cellular tissue based products placed in the center since last visit: Pain Present Now: No Electronic Signature(s) Signed: 03/09/2022 1:41:29 PM By: [name] EMT Entered By: [name] on 03/09/2022 13:41:29 -------------------------------------------------------------------------------- Encounter Discharge Information Details Patient Name: Date of Service: NA [name] 03/09/2022 8:00 A M Medical Record Number: [id number] Patient Account Number: [id number] Date of Birth/Sex: Treating RN: [birth year] (66 y.o. [name] Primary Care Yoniel Arkwright: [name] Other Clinician: [name] Referring Addi Pak: Treating Breslin Burklow/Extender: [name] Weeks in Treatment: 28 Encounter Discharge  Information Items Discharge Condition: Stable Ambulatory Status: Knee Scooter Discharge Destination: Home Transportation: Private Auto Accompanied By: None Schedule Follow-up Appointment: Yes Clinical Summary of Care: Electronic Signature(s) Signed: 03/09/2022 1:52:29 PM By: [name] EMT Entered By: [name] on 03/09/2022 13:52:29 -------------------------------------------------------------------------------- Vitals Details Patient Name: Date of Service: NA [name] 03/09/2022 8:00 A M Medical Record Number: [id number] Patient Account Number: [id number] Date of Birth/Sex: Treating RN: [birth year] (66 y.o. [name], Tammi Klippel Primary Care Alvino Lechuga: [name] Other Clinician: [name] Referring Hitomi Slape: Treating Laquia Rosano/Extender: [name] Weeks in Treatment: 28 Vital Signs Time Taken: 08:33 Temperature (F): 97.5 Height (in): 74 Pulse (bpm): 81 Weight (lbs): 186 Respiratory Rate (breaths/min): 16 Body Mass Index (BMI): 23.9 Blood Pressure (mmHg): 139/85 Capillary Blood Glucose (mg/dl): 278 Reference Range: 80 - 120 mg / dl Electronic Signature(s) Signed: 03/09/2022 1:41:57 PM By: [name] EMT Entered By: [name] on 03/09/2022 13:41:57

## 2022-03-10 ENCOUNTER — Encounter (HOSPITAL_BASED_OUTPATIENT_CLINIC_OR_DEPARTMENT_OTHER): Payer: BC Managed Care – PPO | Admitting: General Surgery

## 2022-03-10 DIAGNOSIS — M86672 Other chronic osteomyelitis, left ankle and foot: Secondary | ICD-10-CM | POA: Diagnosis not present

## 2022-03-10 DIAGNOSIS — I2510 Atherosclerotic heart disease of native coronary artery without angina pectoris: Secondary | ICD-10-CM | POA: Diagnosis not present

## 2022-03-10 DIAGNOSIS — E11610 Type 2 diabetes mellitus with diabetic neuropathic arthropathy: Secondary | ICD-10-CM | POA: Diagnosis not present

## 2022-03-10 DIAGNOSIS — T8131XA Disruption of external operation (surgical) wound, not elsewhere classified, initial encounter: Secondary | ICD-10-CM | POA: Diagnosis not present

## 2022-03-10 DIAGNOSIS — E11621 Type 2 diabetes mellitus with foot ulcer: Secondary | ICD-10-CM | POA: Diagnosis not present

## 2022-03-10 DIAGNOSIS — L97324 Non-pressure chronic ulcer of left ankle with necrosis of bone: Secondary | ICD-10-CM | POA: Diagnosis not present

## 2022-03-10 LAB — GLUCOSE, CAPILLARY
Glucose-Capillary: 249 mg/dL — ABNORMAL HIGH (ref 70–99)
Glucose-Capillary: 224 mg/dL — ABNORMAL HIGH (ref 70–99)

## 2022-03-10 NOTE — Progress Notes (Signed)
[name], CHIO ([id number]) Visit Report for 03/10/2022 Problem List Details Patient Name: Date of Service: NA [name] 03/10/2022 8:00 A M Medical Record Number: [id number] Patient Account Number: [id number] Date of Birth/Sex: Treating RN: [birth year] (66 y.o. 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 03/10/2022 Medical Record Number: [id number] Patient Account Number: [id number] Date of Birth/Sex: Treating RN: 02/17/1956 (66 y.o. 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 E11.621 Type 2  diabetes mellitus with foot ulcer I25.10 Atherosclerotic heart disease of native coronary artery without angina pectoris Facility Procedures CPT4 Code: [id number] Description: G0277-(Facility Use Only) HBOT full body chamber, 62min , ICD-10 Diagnosis Description E11.621 Type 2 diabetes mellitus with foot ulcer 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 M86.672 Other chronic  osteomyelitis, left ankle and foot 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 Modifier: Quantity: 4 Physician Procedures : CPT4 Code Description Modifier [id number] - WC PHYS HYPERBARIC OXYGEN THERAPY ICD-10 Diagnosis Description E11.621 Type 2 diabetes mellitus with foot ulcer 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

## 2022-03-10 NOTE — Progress Notes (Signed)
CORI, HENNINGSEN ([id number]) Visit Report for 03/10/2022 Arrival Information Details Patient Name: Date of Service: NA [name] 03/10/2022 8:00 A M Medical Record Number: [id number] Patient Account Number: [id number] Date of Birth/Sex: Treating RN: Apr 25, 1956 (66 y.o. [name] Primary Care Jaclin Finks: [name] Other Clinician: [name] Referring Ngina Royer: Treating Syble Picco/Extender: [name] in Treatment: 28 Visit Information History Since Last Visit All ordered tests and consults were completed: Yes Patient Arrived: Knee Scooter Added or deleted any medications: No Arrival Time: 08:17 Any new allergies or adverse reactions: No Accompanied By: Wife Had a fall or experienced change in No Transfer Assistance: None activities of daily living that may affect Patient Identification Verified: Yes risk of falls: Secondary Verification Process Completed: Yes Signs or symptoms of abuse/neglect since last visito No Patient Requires Transmission-Based Precautions: No Hospitalized since last visit: No Patient Has Alerts: Yes Implantable device outside of the clinic excluding No Patient Alerts: Patient on Blood Thinner cellular tissue based products placed in the center since last visit: Pain Present Now: No Electronic Signature(s) Signed: 03/10/2022 12:53:07 PM By: [name] EMT Entered By: [name] on 03/10/2022 12:53:07 -------------------------------------------------------------------------------- Encounter Discharge Information Details Patient Name: Date of Service: NA [name] 03/10/2022 8:00 A M Medical Record Number: [id number] Patient Account Number: [id number] Date of Birth/Sex: Treating RN: 1956/01/23 (66 y.o. [name] Primary Care Dary Dilauro: [name] Other Clinician: [name] Referring Jadian Karman: Treating Madgie Dhaliwal/Extender: [name] Weeks in Treatment: 28 Encounter  Discharge Information Items Discharge Condition: Stable Ambulatory Status: Knee Scooter Discharge Destination: Home Transportation: Private Auto Accompanied By: None Schedule Follow-up Appointment: Yes Clinical Summary of Care: Electronic Signature(s) Signed: 03/10/2022 12:58:46 PM By: [name] EMT Entered By: [name] on 03/10/2022 12:58:46 -------------------------------------------------------------------------------- Vitals Details Patient Name: Date of Service: NA RRO Delane Ginger, [name] 03/10/2022 8:00 A M Medical Record Number: [id number] Patient Account Number: [id number] Date of Birth/Sex: Treating RN: 03-Sep-1955 (66 y.o. [name] Primary Care Arlen Legendre: [name] Other Clinician: [name] Referring Mathieu Schloemer: Treating Latonya Knight/Extender: [name] Weeks in Treatment: 28 Vital Signs Time Taken: 08:31 Temperature (F): 97.5 Height (in): 74 Pulse (bpm): 81 Weight (lbs): 186 Respiratory Rate (breaths/min): 16 Body Mass Index (BMI): 23.9 Blood Pressure (mmHg): 121/83 Capillary Blood Glucose (mg/dl): 249 Reference Range: 80 - 120 mg / dl Electronic Signature(s) Signed: 03/10/2022 12:54:07 PM By: [name] EMT Entered By: [name] on 03/10/2022 12:54:07

## 2022-03-10 NOTE — Progress Notes (Addendum)
ZESHAN, SENA ([id number]) Visit Report for 03/10/2022 HBO Details Patient Name: Date of Service: NA [name] 03/10/2022 8:00 A M Medical Record Number: [id number] Patient Account Number: [id number] Date of Birth/Sex: Treating RN: 11-Jun-1956 (66 y.o. [name] Primary Care Smt. Loder: [name] Other Clinician: [name] Referring Jyquan Kenley: Treating Raigan Baria/Extender: [name]: 28 HBO Treatment Course Details Treatment Course Number: 1 Ordering Concepcion Kirkpatrick: [name] T Treatments Ordered: otal 120 HBO Treatment Start Date: 09/20/2021 HBO Indication: Diabetic Ulcer(s) of the Lower Extremity Standard/Conservative Wound Care tried and failed greater than or equal to 30 days HBO Treatment Details Treatment Number: 109 Patient Type: Outpatient Chamber Type: Monoplace Chamber Serial #: [id number] Treatment Protocol: 2.0 ATA with 90 minutes oxygen, with two 5 minute air breaks Treatment Details Compression Rate Down: 2.0 psi / minute De-Compression Rate Up: 2.0 psi / minute A breaks and breathing ir Compress Tx Pressure periods Decompress Decompress Begins Reached (leave unused spaces Begins Ends blank) Chamber Pressure (ATA 1 2 2 2 2 2  --2 1 ) Clock Time (24 hr) 08:35 08:48 09:18 09:23 09:53 09:58 - - 10:28 10:39 Treatment Length: 124 (minutes) Treatment Segments: 4 Vital Signs Capillary Blood Glucose Reference Range: 80 - 120 mg / dl HBO Diabetic Blood Glucose Intervention Range: <131 mg/dl or mg/dl Time Vitals Blood Respiratory Capillary Blood Glucose Pulse Action Type: Pulse: Temperature: Taken: Pressure: Rate: Glucose (mg/dl): Meter #: Oximetry (%) Taken: Pre 08:31 121/83 81 16 97.5 249 Post 10:42 186/77 71 18 97.3 244 Treatment Response Treatment Toleration: Well Treatment Completion Status: Treatment Completed without Adverse Event Additional Procedure Documentation Tissue Sevierity: Necrosis of  bone Physician HBO Attestation: I certify that I supervised this HBO treatment in accordance with Medicare guidelines. A trained emergency response team is readily available per Yes hospital policies and procedures. Continue HBOT as ordered. Yes Electronic Signature(s) Signed: 03/10/2022 2:55:56 PM By: 03/12/2022 MD FACS Previous Signature: 03/10/2022 12:57:13 PM Version By: 03/12/2022 EMT Entered By: [name] on 03/10/2022 14:55:55 -------------------------------------------------------------------------------- HBO Safety Checklist Details Patient Name: Date of Service: 03/12/2022 A. 03/10/2022 8:00 A M Medical Record Number: 03/12/2022 Patient Account Number: [id number] Date of Birth/Sex: Treating RN: Mar 10, 1956 (66 y.o. 77 Primary Care Brooklee Michelin: [name] Other Clinician: [name] Referring Annaliz Aven: Treating Vesna Kable/Extender: [name]: 28 HBO Safety Checklist Items Safety Checklist Consent Form Signed Patient voided / foley secured and emptied When did you last eato 0615 Last dose of injectable or oral agent 0615 Ostomy pouch emptied and vented if applicable NA All implantable devices assessed, documented and approved NA Intravenous access site secured and place NA Valuables secured Linens and cotton and cotton/polyester blend (less than 51% polyester) Personal oil-based products / skin lotions / body lotions removed Wigs or hairpieces removed NA Smoking or tobacco materials removed Books / newspapers / magazines / loose paper removed Cologne, aftershave, perfume and deodorant removed Jewelry removed (may wrap wedding band) NA Make-up removed NA Hair care products removed Battery operated devices (external) removed Heating patches and chemical warmers removed Titanium eyewear removed NA Nail polish cured greater than 10 hours NA Casting material cured greater than 10  hours NA Hearing aids removed NA Loose dentures or partials removed NA Prosthetics have been removed NA Patient demonstrates correct use of air break device (if applicable) Patient concerns have been addressed Patient grounding bracelet on and cord attached to chamber Specifics for Inpatients (complete  in addition to above) Medication sheet sent with patient NA Intravenous medications needed or due during therapy sent with patient NA Drainage tubes (e.g. nasogastric tube or chest tube secured and vented) NA Endotracheal or Tracheotomy tube secured NA Cuff deflated of air and inflated with saline NA Airway suctioned NA Notes The safety checklist was done before the treatment was started. Electronic Signature(s) Signed: 03/10/2022 12:55:36 PM By: [name] EMT Entered By: [name] on 03/10/2022 12:55:36

## 2022-03-11 ENCOUNTER — Encounter (HOSPITAL_BASED_OUTPATIENT_CLINIC_OR_DEPARTMENT_OTHER): Payer: BC Managed Care – PPO | Admitting: General Surgery

## 2022-03-11 ENCOUNTER — Encounter (HOSPITAL_BASED_OUTPATIENT_CLINIC_OR_DEPARTMENT_OTHER): Payer: BC Managed Care – PPO | Attending: General Surgery | Admitting: General Surgery

## 2022-03-11 DIAGNOSIS — L97324 Non-pressure chronic ulcer of left ankle with necrosis of bone: Secondary | ICD-10-CM | POA: Insufficient documentation

## 2022-03-11 DIAGNOSIS — T8131XA Disruption of external operation (surgical) wound, not elsewhere classified, initial encounter: Secondary | ICD-10-CM | POA: Diagnosis not present

## 2022-03-11 DIAGNOSIS — M86672 Other chronic osteomyelitis, left ankle and foot: Secondary | ICD-10-CM | POA: Insufficient documentation

## 2022-03-11 DIAGNOSIS — E11621 Type 2 diabetes mellitus with foot ulcer: Secondary | ICD-10-CM | POA: Insufficient documentation

## 2022-03-11 DIAGNOSIS — E1151 Type 2 diabetes mellitus with diabetic peripheral angiopathy without gangrene: Secondary | ICD-10-CM | POA: Insufficient documentation

## 2022-03-11 DIAGNOSIS — E11610 Type 2 diabetes mellitus with diabetic neuropathic arthropathy: Secondary | ICD-10-CM | POA: Diagnosis not present

## 2022-03-11 DIAGNOSIS — I2510 Atherosclerotic heart disease of native coronary artery without angina pectoris: Secondary | ICD-10-CM | POA: Diagnosis not present

## 2022-03-11 LAB — GLUCOSE, CAPILLARY: Glucose-Capillary: 152 mg/dL — ABNORMAL HIGH (ref 70–99)

## 2022-03-11 NOTE — Progress Notes (Addendum)
KSEAN, VALE ([id number]) Visit Report for 03/11/2022 Arrival Information Details Patient Name: Date of Service: NA [name] 03/11/2022 8:00 A M Medical Record Number: [id number] Patient Account Number: [id number] Date of Birth/Sex: Treating RN: 1956-02-13 (66 y.o. [name] Primary Care Vandy Tsuchiya: [name] Other Clinician: [name] Referring Macyn Shropshire: Treating Zerina Hallinan/Extender: [name] in Treatment: 28 Visit Information History Since Last Visit All ordered tests and consults were completed: Yes Patient Arrived: Knee Scooter Added or deleted any medications: No Arrival Time: 08:21 Any new allergies or adverse reactions: No Accompanied By: Wife Had a fall or experienced change in No Transfer Assistance: None activities of daily living that may affect Patient Identification Verified: Yes risk of falls: Secondary Verification Process Completed: Yes Signs or symptoms of abuse/neglect since last visito No Patient Requires Transmission-Based Precautions: No Hospitalized since last visit: No Patient Has Alerts: Yes Implantable device outside of the clinic excluding No Patient Alerts: Patient on Blood Thinner cellular tissue based products placed in the center since last visit: Pain Present Now: No Electronic Signature(s) Signed: 03/11/2022 2:56:12 PM By: [name] EMT Entered By: [name] on 03/11/2022 14:56:12 -------------------------------------------------------------------------------- Encounter Discharge Information Details Patient Name: Date of Service: NA Colin [name] 03/11/2022 8:00 A M Medical Record Number: [id number] Patient Account Number: [id number] Date of Birth/Sex: Treating RN: May 22, 1956 (66 y.o. [name] Primary Care Nuria Phebus: [name] Other Clinician: [name] Referring Tanise Russman: Treating Delsie Amador/Extender: [name] in Treatment: 28 Encounter Discharge  Information Items Discharge Condition: Stable Ambulatory Status: Knee Scooter Discharge Destination: Other (Note Required) Transportation: Private Auto Accompanied By: Wife Schedule Follow-up Appointment: Yes Clinical Summary of Care: Notes The patient had a [facility] visit after his treatment today. Electronic Signature(s) Signed: 03/11/2022 3:01:53 PM By: [name] EMT Entered By: [name] on 03/11/2022 15:01:52 -------------------------------------------------------------------------------- Vitals Details Patient Name: Date of Service: NA Colin [name] [name] 03/11/2022 8:00 A M Medical Record Number: [id number] Patient Account Number: [id number] Date of Birth/Sex: Treating RN: 1955/11/26 (66 y.o. [name] Primary Care Taneasha Fuqua: [name] Other Clinician: [name] Referring Kinsleigh Ludolph: Treating Jonita Hirota/Extender: [name] Weeks in Treatment: 28 Vital Signs Time Taken: 08:33 Temperature (F): 97.9 Height (in): 74 Pulse (bpm): 83 Weight (lbs): 186 Respiratory Rate (breaths/min): 16 Body Mass Index (BMI): 23.9 Blood Pressure (mmHg): 123/79 Capillary Blood Glucose (mg/dl): 152 Reference Range: 80 - 120 mg / dl Electronic Signature(s) Signed: 03/11/2022 2:57:32 PM By: [name] EMT Entered By: [name] on 03/11/2022 14:57:32

## 2022-03-11 NOTE — Progress Notes (Signed)
[name], [name] ([id number]) Visit Report for 03/11/2022 Arrival Information Details Patient Name: Date of Service: NA [name] 03/11/2022 10:45 A M Medical Record Number: [id number] Patient Account Number: [id number] Date of Birth/Sex: Treating RN: [birth year] (66 y.o. [name] Primary Care [name]: [name] Other Clinician: Referring [name]: Treating [name]/Extender: [name] in Treatment: 28 Visit Information History Since Last Visit Added or deleted any medications: No Patient Arrived: Knee Scooter Any new allergies or adverse reactions: No Arrival Time: 11:46 Had a fall or experienced change in No Accompanied By: self activities of daily living that may affect Transfer Assistance: None risk of falls: Patient Identification Verified: Yes Signs or symptoms of abuse/neglect since last visito No Secondary Verification Process Completed: Yes Hospitalized since last visit: No Patient Requires Transmission-Based Precautions: No Implantable device outside of the clinic excluding No Patient Has Alerts: Yes cellular tissue based products placed in the center Patient Alerts: Patient on Blood Thinner since last visit: Has Dressing in Place as Prescribed: Yes Has Compression in Place as Prescribed: Yes Pain Present Now: No Electronic Signature(s) Signed: 03/11/2022 5:12:46 PM By: Colin Gouty RN, BSN Entered By: [name] on 03/11/2022 11:47:27 -------------------------------------------------------------------------------- Clinic Level of Care Assessment Details Patient Name: Date of Service: [name] 03/11/2022 10:45 A M Medical Record Number: [id number] Patient Account Number: [id number] Date of Birth/Sex: Treating RN: November 26, 1955 (66 y.o. [name] Primary Care [name]: [name] Other Clinician: Referring [name]: Treating [name]/Extender: [name] in Treatment:  28 Clinic Level of Care Assessment Items TOOL 4 Quantity Score []  - 0 Use when only an EandM is performed on FOLLOW-UP visit ASSESSMENTS - Nursing Assessment / Reassessment X- 1 10 Reassessment of Co-morbidities (includes updates in patient status) X- 1 5 Reassessment of Adherence to Treatment Plan ASSESSMENTS - Wound and Skin A ssessment / Reassessment X - Simple Wound Assessment / Reassessment - one wound 1 5 []  - 0 Complex Wound Assessment / Reassessment - multiple wounds []  - 0 Dermatologic / Skin Assessment (not related to wound area) ASSESSMENTS - Focused Assessment []  - 0 Circumferential Edema Measurements - multi extremities []  - 0 Nutritional Assessment / Counseling / Intervention []  - 0 Lower Extremity Assessment (monofilament, tuning fork, pulses) []  - 0 Peripheral Arterial Disease Assessment (using hand held doppler) ASSESSMENTS - Ostomy and/or Continence Assessment and Care []  - 0 Incontinence Assessment and Management []  - 0 Ostomy Care Assessment and Management (repouching, etc.) PROCESS - Coordination of Care X - Simple Patient / Family Education for ongoing care 1 15 []  - 0 Complex (extensive) Patient / Family Education for ongoing care X- 1 10 Staff obtains Programmer, systems, Records, T Results / Process Orders est []  - 0 Staff telephones HHA, Nursing Homes / Clarify orders / etc []  - 0 Routine Transfer to another Facility (non-emergent condition) []  - 0 Routine Hospital Admission (non-emergent condition) []  - 0 New Admissions / Biomedical engineer / Ordering NPWT Apligraf, etc. , []  - 0 Emergency Hospital Admission (emergent condition) X- 1 10 Simple Discharge Coordination []  - 0 Complex (extensive) Discharge Coordination PROCESS - Special Needs []  - 0 Pediatric / Minor Patient Management []  - 0 Isolation Patient Management []  - 0 Hearing / Language / Visual special needs []  - 0 Assessment of Community assistance (transportation, D/C  planning, etc.) []  - 0 Additional assistance / Altered mentation []  - 0 Support Surface(s) Assessment (bed, cushion, seat, etc.) INTERVENTIONS - Wound Cleansing /  Measurement X - Simple Wound Cleansing - one wound 1 5 []  - 0 Complex Wound Cleansing - multiple wounds []  - 0 Wound Imaging (photographs - any number of wounds) []  - 0 Wound Tracing (instead of photographs) []  - 0 Simple Wound Measurement - one wound []  - 0 Complex Wound Measurement - multiple wounds INTERVENTIONS - Wound Dressings X - Small Wound Dressing one or multiple wounds 1 10 []  - 0 Medium Wound Dressing one or multiple wounds []  - 0 Large Wound Dressing one or multiple wounds X- 1 5 Application of Medications - topical []  - 0 Application of Medications - injection INTERVENTIONS - Miscellaneous []  - 0 External ear exam []  - 0 Specimen Collection (cultures, biopsies, blood, body fluids, etc.) []  - 0 Specimen(s) / Culture(s) sent or taken to Lab for analysis []  - 0 Patient Transfer (multiple staff / / Similar devices) []  - 0 Simple Staple / Suture removal (25 or less) []  - 0 Complex Staple / Suture removal (26 or more) []  - 0 Hypo / Hyperglycemic Management (close monitor of Blood Glucose) []  - 0 Ankle / Brachial Index (ABI) - do not check if billed separately X- 1 5 Vital Signs Has the patient been seen at the hospital within the last three years: Yes Total Score: 80 Level Of Care: New/Established - Level 3 Electronic Signature(s) Signed: 03/11/2022 5:12:46 PM By: RN, BSN Entered By: on 03/11/2022 11:52:45 -------------------------------------------------------------------------------- Encounter Discharge Information Details Patient Name: Date of Service: NA A. 03/11/2022 10:45 A M Medical Record Number: Patient Account Number: Date of Birth/Sex: Treating RN: 07-Sep-1955 (66 y.o. Primary Care  [name]: [name] Other Clinician: Referring [name]: Treating [name]/Extender: in Treatment: 28 Encounter Discharge Information Items Discharge Condition: Stable Ambulatory Status: Knee Scooter Discharge Destination: Home Transportation: Private Auto Accompanied By: self Schedule Follow-up Appointment: Yes Clinical Summary of Care: Patient Declined Electronic Signature(s) Signed: 03/11/2022 5:12:46 PM By: RN, BSN Entered By: 03/13/2022 on 03/11/2022 11:51:55 -------------------------------------------------------------------------------- Patient/Caregiver Education Details Patient Name: Date of Service: NA [name] 9/29/2023andnbsp10:45 A M Medical Record Number: [name] Nephew Patient Account Number: 03/13/2022 Date of Birth/Gender: Treating RN: 10-12-1955 (66 y.o. 08/09/1955 Primary Care Physician: 79 Other Clinician: Referring Physician: Treating Physician/Extender: [name] in Treatment: 28 Education Assessment Education Provided To: Patient Education Topics Provided Infection: Methods: Explain/Verbal Responses: Reinforcements needed, State content correctly Wound/Skin Impairment: Methods: Explain/Verbal Responses: Reinforcements needed, State content correctly Electronic Signature(s) Signed: 03/11/2022 5:12:46 PM By: [name] RN, BSN Entered By: 29 on 03/11/2022 11:51:41 -------------------------------------------------------------------------------- Wound Assessment Details Patient Name: Date of Service: NA [name] A. 03/11/2022 10:45 A M Medical Record Number: 03/13/2022 Patient Account Number: [name] Date of Birth/Sex: Treating RN: 1955-11-10 (66 y.o. [id number] Primary Care Nyko Gell: 08/09/1955 Other Clinician: Referring Pauline Trainer: Treating Henley Boettner/Extender: 77 Weeks in Treatment: 28 Wound  Status Wound Number: 1 Primary Diabetic Wound/Ulcer of the Lower Extremity Etiology: Wound Location: Left, Medial Foot Wound Open Wounding Event: Gradually Appeared Status: Date Acquired: 02/22/2021 Comorbid Cataracts, Coronary Artery Disease, Hypertension, Myocardial Weeks Of Treatment: 28 History: Infarction, Peripheral Arterial Disease, Type II Diabetes, Clustered Wound: No Osteomyelitis, Neuropathy Wound Measurements Length: (cm) 5.1 Width: (cm) 1.9 Depth: (cm) 0.1 Area: (cm) 7.611 Volume: (cm) 0.761 % Reduction in Area: 46.2% % Reduction in Volume: 94.6% Epithelialization: Small (1-33%) Tunneling:  No Undermining: No Wound Description Classification: Grade 3 Wound Margin: Distinct, outline attached Exudate Amount: Medium Exudate Type: Purulent Exudate Color: yellow, brown, green Foul Odor After Cleansing: Yes Due to Product Use: No Slough/Fibrino Yes Wound Bed Granulation Amount: Medium (34-66%) Exposed Structure Granulation Quality: Red, Hyper-granulation Fascia Exposed: No Necrotic Amount: Medium (34-66%) Fat Layer (Subcutaneous Tissue) Exposed: Yes Necrotic Quality: Adherent Slough Tendon Exposed: No Muscle Exposed: No Joint Exposed: No Bone Exposed: No Assessment Notes red, excoriated periwound Treatment Notes Wound #1 (Foot) Wound Laterality: Left, Medial Cleanser Wound Cleanser Discharge Instruction: Cleanse the wound with wound cleanser prior to applying a clean dressing using gauze sponges, not tissue or cotton balls. Peri-Wound Care Ketoconazole Cream 2% Discharge Instruction: Apply Ketoconazole as directed Zinc Oxide Ointment 30g tube Discharge Instruction: Apply Zinc Oxide to periwound with each dressing change Topical Gentamicin Discharge Instruction: As directed by physician Primary Dressing KerraCel Ag Gelling Fiber Dressing, 4x5 in (silver alginate) Discharge Instruction: Apply silver alginate to wound bed as instructed Secondary  Dressing ABD Pad, 8x10 Discharge Instruction: Apply over primary dressing as directed. CarboFLEX Odor Control Dressing, 4x4 in Discharge Instruction: Apply over primary dressing as directed. Woven Gauze Sponge, Non-Sterile 4x4 in Discharge Instruction: In clinic. Home Health to apply wound vac. Secured With Compression Wrap Kerlix Roll 4.5x3.1 (in/yd) Discharge Instruction: Apply Kerlix and Coban compression as directed. Coban Self-Adherent Wrap 4x5 (in/yd) Discharge Instruction: Apply over Kerlix as directed. Compression Stockings Add-Ons Electronic Signature(s) Signed: 03/11/2022 5:12:46 PM By: Colin Deed RN, BSN Entered By: [name] on 03/11/2022 11:49:56 -------------------------------------------------------------------------------- Vitals Details Patient Name: Date of Service: NA Colin [name] 03/11/2022 10:45 A M Medical Record Number: [id number] Patient Account Number: [id number] Date of Birth/Sex: Treating RN: 11-Mar-1956 (66 y.o. [name] Primary Care Geron Mulford: [name] Other Clinician: Referring Kip Kautzman: Treating Johnathon Olden/Extender: [name]: 28 Vital Signs Time Taken: 11:30 Reference Range: 80 - 120 mg / dl Height (in): 74 Weight (lbs): 186 Body Mass Index (BMI): 23.9 Notes see HBO documentation Electronic Signature(s) Signed: 03/11/2022 5:12:46 PM By: Colin Deed RN, BSN Entered By: [name] on 03/11/2022 11:48:51

## 2022-03-11 NOTE — Progress Notes (Signed)
OVAL, CAVAZOS ([id number]) Visit Report for 03/11/2022 Problem List Details Patient Name: Date of Service: NA [name] A. 03/11/2022 8:00 A M Medical Record Number: [id number] Patient Account Number: [id number] Date of Birth/Sex: Treating RN: 1955/06/19 (66 y.o. 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 03/11/2022 Medical Record Number: [id number] Patient Account Number: [id number] Date of Birth/Sex: Treating RN: 07-13-1955 (66 y.o. 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 E11.621 Type 2 diabetes  mellitus with foot ulcer I25.10 Atherosclerotic heart disease of native coronary artery without angina pectoris Facility Procedures CPT4 Code: [id number] Description: G0277-(Facility Use Only) HBOT full body chamber, 107min , ICD-10 Diagnosis Description E11.621 Type 2 diabetes mellitus with foot ulcer 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 M86.672 Other chronic  osteomyelitis, left ankle and foot 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 Modifier: Quantity: 4 Physician Procedures : CPT4 Code Description Modifier [id number] - WC PHYS HYPERBARIC OXYGEN THERAPY ICD-10 Diagnosis Description E11.621 Type 2 diabetes mellitus with foot ulcer 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

## 2022-03-11 NOTE — Progress Notes (Addendum)
WOODROE, DUET ([id number]) Visit Report for 03/11/2022 HBO Details Patient Name: Date of Service: [name] 03/11/2022 8:00 A M Medical Record Number: [id number] Patient Account Number: [id number] Date of Birth/Sex: Treating RN: 1955-09-19 (66 y.o. [name] Primary Care [name]: [name] Other Clinician: [name] Referring Zira Helinski: Treating Arrietty Dercole/Extender: [name] Weeks in Treatment: 28 HBO Treatment Course Details Treatment Course Number: 1 Ordering Janelle Culton: [name] T Treatments Ordered: otal 120 HBO Treatment Start Date: 09/20/2021 HBO Indication: Diabetic Ulcer(s) of the Lower Extremity Standard/Conservative Wound Care tried and failed greater than or equal to 30 days HBO Treatment Details Treatment Number: 110 Patient Type: Outpatient Chamber Type: Monoplace Chamber Serial #: [id number] Treatment Protocol: 2.0 ATA with 90 minutes oxygen, with two 5 minute air breaks Treatment Details Compression Rate Down: 2.0 psi / minute De-Compression Rate Up: 2.0 psi / minute A breaks and breathing ir Compress Tx Pressure periods Decompress Decompress Begins Reached (leave unused spaces Begins Ends blank) Chamber Pressure (ATA 1 2 2 2 2 2  --2 1 ) Clock Time (24 hr) 08:45 08:58 09:28 09:33 10:03 10:08 - - 10:38 10:50 Treatment Length: 125 (minutes) Treatment Segments: 4 Vital Signs Capillary Blood Glucose Reference Range: 80 - 120 mg / dl HBO Diabetic Blood Glucose Intervention Range: <131 mg/dl or >249 mg/dl Time Vitals Blood Respiratory Capillary Blood Glucose Pulse Action Type: Pulse: Temperature: Taken: Pressure: Rate: Glucose (mg/dl): Meter #: Oximetry (%) Taken: Pre 08:33 123/79 83 16 97.9 152 Post 10:51 159/96 72 18 98.1 179 Treatment Response Treatment Toleration: Well Treatment Completion Status: Treatment Completed without Adverse Event Additional Procedure Documentation Tissue Sevierity: Necrosis of  bone Physician HBO Attestation: I certify that I supervised this HBO treatment in accordance with Medicare guidelines. A trained emergency response team is readily available per Yes hospital policies and procedures. Continue HBOT as ordered. Yes Electronic Signature(s) Signed: 03/11/2022 3:14:42 PM By: [name] MD FACS Previous Signature: 03/11/2022 3:00:08 PM Version By: [name] EMT Entered By: [name] on 03/11/2022 15:14:42 -------------------------------------------------------------------------------- HBO Safety Checklist Details Patient Name: Date of Service: Colin Loveless A. 03/11/2022 8:00 A M Medical Record Number: [id number] Patient Account Number: [id number] Date of Birth/Sex: Treating RN: February 05, 1956 (66 y.o. [name] Primary Care Ewel Lona: [name] Other Clinician: [name] Referring Dot Splinter: Treating Alahni Varone/Extender: [name] Weeks in Treatment: 28 HBO Safety Checklist Items Safety Checklist Consent Form Signed Patient voided / foley secured and emptied When did you last eato 0630 Last dose of injectable or oral agent 0630 Ostomy pouch emptied and vented if applicable NA All implantable devices assessed, documented and approved NA Intravenous access site secured and place NA Valuables secured Linens and cotton and cotton/polyester blend (less than 51% polyester) Personal oil-based products / skin lotions / body lotions removed Wigs or hairpieces removed NA Smoking or tobacco materials removed Books / newspapers / magazines / loose paper removed Cologne, aftershave, perfume and deodorant removed Jewelry removed (may wrap wedding band) NA Make-up removed NA Hair care products removed Battery operated devices (external) removed Heating patches and chemical warmers removed Titanium eyewear removed NA Nail polish cured greater than 10 hours NA Casting material cured greater than 10  hours NA Hearing aids removed NA Loose dentures or partials removed NA Prosthetics have been removed NA Patient demonstrates correct use of air break device (if applicable) Patient concerns have been addressed Patient grounding bracelet on and cord attached to chamber Specifics for Inpatients (complete  in addition to above) Medication sheet sent with patient NA Intravenous medications needed or due during therapy sent with patient NA Drainage tubes (e.g. nasogastric tube or chest tube secured and vented) NA Endotracheal or Tracheotomy tube secured NA Cuff deflated of air and inflated with saline NA Airway suctioned NA Notes The safety checklist was done before the treatment was started. Electronic Signature(s) Signed: 03/11/2022 2:58:33 PM By: [name] EMT Entered By: [name] on 03/11/2022 14:58:33

## 2022-03-11 NOTE — Progress Notes (Signed)
RENN, DIROCCO ([id number]) Visit Report for 03/11/2022 SuperBill Details Patient Name: Date of Service: NA [name] 03/11/2022 Medical Record Number: [id number] Patient Account Number: [id number] Date of Birth/Sex: Treating RN: [birth year] (66 y.o. 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

## 2022-03-12 DIAGNOSIS — T8131XA Disruption of external operation (surgical) wound, not elsewhere classified, initial encounter: Secondary | ICD-10-CM | POA: Diagnosis not present

## 2022-03-13 DIAGNOSIS — T8131XA Disruption of external operation (surgical) wound, not elsewhere classified, initial encounter: Secondary | ICD-10-CM | POA: Diagnosis not present

## 2022-03-14 ENCOUNTER — Encounter (HOSPITAL_BASED_OUTPATIENT_CLINIC_OR_DEPARTMENT_OTHER): Payer: BC Managed Care – PPO | Admitting: General Surgery

## 2022-03-14 ENCOUNTER — Encounter (HOSPITAL_BASED_OUTPATIENT_CLINIC_OR_DEPARTMENT_OTHER): Payer: BC Managed Care – PPO | Attending: General Surgery | Admitting: General Surgery

## 2022-03-14 DIAGNOSIS — E1169 Type 2 diabetes mellitus with other specified complication: Secondary | ICD-10-CM | POA: Insufficient documentation

## 2022-03-14 DIAGNOSIS — E1140 Type 2 diabetes mellitus with diabetic neuropathy, unspecified: Secondary | ICD-10-CM | POA: Diagnosis not present

## 2022-03-14 DIAGNOSIS — I2510 Atherosclerotic heart disease of native coronary artery without angina pectoris: Secondary | ICD-10-CM | POA: Insufficient documentation

## 2022-03-14 DIAGNOSIS — L97324 Non-pressure chronic ulcer of left ankle with necrosis of bone: Secondary | ICD-10-CM | POA: Insufficient documentation

## 2022-03-14 DIAGNOSIS — E11610 Type 2 diabetes mellitus with diabetic neuropathic arthropathy: Secondary | ICD-10-CM | POA: Insufficient documentation

## 2022-03-14 DIAGNOSIS — M86672 Other chronic osteomyelitis, left ankle and foot: Secondary | ICD-10-CM | POA: Diagnosis not present

## 2022-03-14 DIAGNOSIS — Z951 Presence of aortocoronary bypass graft: Secondary | ICD-10-CM | POA: Insufficient documentation

## 2022-03-14 DIAGNOSIS — E11621 Type 2 diabetes mellitus with foot ulcer: Secondary | ICD-10-CM | POA: Diagnosis not present

## 2022-03-14 DIAGNOSIS — T8131XA Disruption of external operation (surgical) wound, not elsewhere classified, initial encounter: Secondary | ICD-10-CM | POA: Diagnosis not present

## 2022-03-14 LAB — GLUCOSE, CAPILLARY
Glucose-Capillary: 188 mg/dL — ABNORMAL HIGH (ref 70–99)
Glucose-Capillary: 280 mg/dL — ABNORMAL HIGH (ref 70–99)

## 2022-03-14 NOTE — Progress Notes (Signed)
PETRA, SARGEANT ([id number]) Visit Report for 03/14/2022 Problem List Details Patient Name: Date of Service: [name] 03/14/2022 8:00 A M Medical Record Number: [id number] Patient Account Number: [id number] Date of Birth/Sex: Treating RN: [birth year] (66 y.o. 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 03/14/2022 Medical Record Number: [id number] Patient Account Number: [id number] Date of Birth/Sex: Treating RN: 1955/09/20 (66 y.o. 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 E11.621 Type 2  diabetes mellitus with foot ulcer I25.10 Atherosclerotic heart disease of native coronary artery without angina pectoris Facility Procedures CPT4 Code: [id number] Description: G0277-(Facility Use Only) HBOT full body chamber, 66min , ICD-10 Diagnosis Description E11.621 Type 2 diabetes mellitus with foot ulcer 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 M86.672 Other chronic  osteomyelitis, left ankle and foot 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 Modifier: Quantity: 4 Physician Procedures : CPT4 Code Description Modifier [id number] - WC PHYS HYPERBARIC OXYGEN THERAPY ICD-10 Diagnosis Description E11.621 Type 2 diabetes mellitus with foot ulcer 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

## 2022-03-14 NOTE — Progress Notes (Addendum)
CURLEE, BOGAN ([id number]) Visit Report for 03/14/2022 HBO Details Patient Name: Date of Service: NA [name] 03/14/2022 8:00 A M Medical Record Number: [id number] Patient Account Number: [id number] Date of Birth/Sex: Treating RN: 1956/04/14 (66 y.o. [name] Primary Care Shirel Mallis: [name] Other Clinician: [name] Referring Lexus Shampine: Treating Keairra Bardon/Extender: [name] Weeks in Treatment: 28 HBO Treatment Course Details Treatment Course Number: 1 Ordering Emil Klassen: [name] T Treatments Ordered: otal 120 HBO Treatment Start Date: 09/20/2021 HBO Indication: Diabetic Ulcer(s) of the Lower Extremity Standard/Conservative Wound Care tried and failed greater than or equal to 30 days HBO Treatment Details Treatment Number: 111 Patient Type: Outpatient Chamber Type: Monoplace Chamber Serial #: [id number] Treatment Protocol: 2.0 ATA with 90 minutes oxygen, with two 5 minute air breaks Treatment Details Compression Rate Down: 2.0 psi / minute De-Compression Rate Up: 2.0 psi / minute A breaks and breathing ir Compress Tx Pressure periods Decompress Decompress Begins Reached (leave unused spaces Begins Ends blank) Chamber Pressure (ATA 1 2 2 2 2 2  --2 1 ) Clock Time (24 hr) 08:47 08:59 09:29 09:34 10:04 10:10 - - 10:40 10:51 Treatment Length: 124 (minutes) Treatment Segments: 4 Vital Signs Capillary Blood Glucose Reference Range: 80 - 120 mg / dl HBO Diabetic Blood Glucose Intervention Range: <131 mg/dl or >249 mg/dl Time Vitals Blood Respiratory Capillary Blood Glucose Pulse Action Type: Pulse: Temperature: Taken: Pressure: Rate: Glucose (mg/dl): Meter #: Oximetry (%) Taken: Pre 08:43 152/70 80 16 97.5 188 Post 10:53 164/83 70 18 97.3 280 Treatment Response Treatment Toleration: Well Treatment Completion Status: Treatment Completed without Adverse Event Additional Procedure Documentation Tissue Sevierity: Necrosis  of bone Physician HBO Attestation: I certify that I supervised this HBO treatment in accordance with Medicare guidelines. A trained emergency response team is readily available per Yes hospital policies and procedures. Continue HBOT as ordered. Yes Electronic Signature(s) Signed: 03/14/2022 12:03:28 PM By: [name] MD FACS Previous Signature: 03/14/2022 11:27:32 AM Version By: [name] EMT Entered By: [name] on 03/14/2022 12:03:28 -------------------------------------------------------------------------------- HBO Safety Checklist Details Patient Name: Date of Service: NA [name] 03/14/2022 8:00 A M Medical Record Number: [id number] Patient Account Number: [id number] Date of Birth/Sex: Treating RN: [birth year] (66 y.o. [name] Primary Care Iliany Losier: [name] Other Clinician: [name] Referring Dontrae Morini: Treating Nyssa Sayegh/Extender: [name] Weeks in Treatment: 28 HBO Safety Checklist Items Safety Checklist Consent Form Signed Patient voided / foley secured and emptied When did you last eato 0630 Last dose of injectable or oral agent 0630 Ostomy pouch emptied and vented if applicable NA All implantable devices assessed, documented and approved NA Intravenous access site secured and place NA Valuables secured Linens and cotton and cotton/polyester blend (less than 51% polyester) Personal oil-based products / skin lotions / body lotions removed Wigs or hairpieces removed NA Smoking or tobacco materials removed Books / newspapers / magazines / loose paper removed Cologne, aftershave, perfume and deodorant removed Jewelry removed (may wrap wedding band) NA Make-up removed NA Hair care products removed Battery operated devices (external) removed Heating patches and chemical warmers removed Titanium eyewear removed NA Nail polish cured greater than 10 hours NA Casting material cured greater than 10  hours NA Hearing aids removed NA Loose dentures or partials removed NA Prosthetics have been removed NA Patient demonstrates correct use of air break device (if applicable) Patient concerns have been addressed Patient grounding bracelet on and cord attached to chamber Specifics for Inpatients (complete  in addition to above) Medication sheet sent with patient NA Intravenous medications needed or due during therapy sent with patient NA Drainage tubes (e.g. nasogastric tube or chest tube secured and vented) NA Endotracheal or Tracheotomy tube secured NA Cuff deflated of air and inflated with saline NA Airway suctioned NA Notes The safety checklist was done before the treatment was started. Electronic Signature(s) Signed: 03/14/2022 11:25:40 AM By: [name] EMT Entered By: [name] on 03/14/2022 11:25:39

## 2022-03-14 NOTE — Progress Notes (Signed)
[name], [name] ([id number]) Visit Report for 03/14/2022 SuperBill Details Patient Name: Date of Service: NA [name] 03/14/2022 Medical Record Number: [id number] Patient Account Number: [id number] Date of Birth/Sex: Treating RN: January 30, 1956 (66 y.o. 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

## 2022-03-14 NOTE — Progress Notes (Signed)
[name], [name] ([id number]) Visit Report for 03/14/2022 Arrival Information Details Patient Name: Date of Service: NA [name] 03/14/2022 11:15 A M Medical Record Number: [id number] Patient Account Number: [id number] Date of Birth/Sex: Treating RN: [name]/[name] (66 y.o. [name] Primary Care [name]: [name] Other Clinician: Referring [name]: Treating [name]/Extender: [name] in Treatment: 28 Visit Information History Since Last Visit Has Dressing in Place as Prescribed: Yes Patient Arrived: Knee Scooter Has Compression in Place as Prescribed: Yes Arrival Time: 11:21 Pain Present Now: No Accompanied By: self Transfer Assistance: None Patient Identification Verified: Yes Secondary Verification Process Completed: Yes Patient Requires Transmission-Based Precautions: No Patient Has Alerts: Yes Patient Alerts: Patient on Blood Thinner Electronic Signature(s) Signed: 03/14/2022 5:15:44 PM By: [name] RN, BSN Entered By: [name] on 03/14/2022 11:36:45 -------------------------------------------------------------------------------- Clinic Level of Care Assessment Details Patient Name: Date of Service: [name] 03/14/2022 11:15 A M Medical Record Number: [id number] Patient Account Number: [id number] Date of Birth/Sex: Treating RN: [name][birth year] (66 y.o. [name] Primary Care [name]: [name] Other Clinician: Referring [name]: Treating [name]/Extender: [name] in Treatment: 28 Clinic Level of Care Assessment Items TOOL 4 Quantity Score []  - 0 Use when only an EandM is performed on FOLLOW-UP visit ASSESSMENTS - Nursing Assessment / Reassessment X- 1 10 Reassessment of Co-morbidities (includes updates in patient status) X- 1 5 Reassessment of Adherence to Treatment Plan ASSESSMENTS - Wound and Skin A ssessment / Reassessment X - Simple Wound Assessment /  Reassessment - one wound 1 5 []  - 0 Complex Wound Assessment / Reassessment - multiple wounds []  - 0 Dermatologic / Skin Assessment (not related to wound area) ASSESSMENTS - Focused Assessment []  - 0 Circumferential Edema Measurements - multi extremities []  - 0 Nutritional Assessment / Counseling / Intervention []  - 0 Lower Extremity Assessment (monofilament, tuning fork, pulses) []  - 0 Peripheral Arterial Disease Assessment (using hand held doppler) ASSESSMENTS - Ostomy and/or Continence Assessment and Care []  - 0 Incontinence Assessment and Management []  - 0 Ostomy Care Assessment and Management (repouching, etc.) PROCESS - Coordination of Care X - Simple Patient / Family Education for ongoing care 1 15 []  - 0 Complex (extensive) Patient / Family Education for ongoing care X- 1 10 Staff obtains Consents, Records, T Results / Process Orders est []  - 0 Staff telephones HHA, Nursing Homes / Clarify orders / etc []  - 0 Routine Transfer to another Facility (non-emergent condition) []  - 0 Routine Hospital Admission (non-emergent condition) []  - 0 New Admissions / Biomedical engineer / Ordering NPWT Apligraf, etc. , []  - 0 Emergency Hospital Admission (emergent condition) X- 1 10 Simple Discharge Coordination []  - 0 Complex (extensive) Discharge Coordination PROCESS - Special Needs []  - 0 Pediatric / Minor Patient Management []  - 0 Isolation Patient Management []  - 0 Hearing / Language / Visual special needs []  - 0 Assessment of Community assistance (transportation, D/C planning, etc.) []  - 0 Additional assistance / Altered mentation []  - 0 Support Surface(s) Assessment (bed, cushion, seat, etc.) INTERVENTIONS - Wound Cleansing / Measurement X - Simple Wound Cleansing - one wound 1 5 []  - 0 Complex Wound Cleansing - multiple wounds []  - 0 Wound Imaging (photographs - any number of wounds) []  - 0 Wound Tracing (instead of photographs) []  - 0 Simple Wound  Measurement - one wound []  - 0 Complex Wound Measurement - multiple wounds INTERVENTIONS - Wound Dressings []  - 0  Small Wound Dressing one or multiple wounds X- 1 15 Medium Wound Dressing one or multiple wounds []  - 0 Large Wound Dressing one or multiple wounds X- 1 5 Application of Medications - topical []  - 0 Application of Medications - injection INTERVENTIONS - Miscellaneous []  - 0 External ear exam []  - 0 Specimen Collection (cultures, biopsies, blood, body fluids, etc.) []  - 0 Specimen(s) / Culture(s) sent or taken to Lab for analysis []  - 0 Patient Transfer (multiple staff / Civil Service fast streamer / Similar devices) []  - 0 Simple Staple / Suture removal (25 or less) []  - 0 Complex Staple / Suture removal (26 or more) []  - 0 Hypo / Hyperglycemic Management (close monitor of Blood Glucose) []  - 0 Ankle / Brachial Index (ABI) - do not check if billed separately X- 1 5 Vital Signs Has the patient been seen at the hospital within the last three years: Yes Total Score: 85 Level Of Care: New/Established - Level 3 Electronic Signature(s) Signed: 03/14/2022 5:15:44 PM By: [name] RN, BSN Entered By: [name] on 03/14/2022 11:42:54 -------------------------------------------------------------------------------- Encounter Discharge Information Details Patient Name: Date of Service: [name] 03/14/2022 11:15 A M Medical Record Number: [id number] Patient Account Number: [id number] Date of Birth/Sex: Treating RN: [name] (66 y.o. [name] Primary Care [name]: [name] Other Clinician: Referring [name]: Treating [name]/Extender: [name] in Treatment: 28 Encounter Discharge Information Items Discharge Condition: Stable Ambulatory Status: Knee Scooter Discharge Destination: Home Transportation: Private Auto Accompanied By: self Schedule Follow-up Appointment: Yes Clinical Summary of Care: Patient  Declined Electronic Signature(s) Signed: 03/14/2022 5:15:44 PM By: [name] RN, BSN Entered By: [name] on 03/14/2022 11:41:56 -------------------------------------------------------------------------------- Patient/Caregiver Education Details Patient Name: Date of Service: NA [name] 10/2/2023andnbsp11:15 A M Medical Record Number: [id number] Patient Account Number: [id number] Date of Birth/Gender: Treating RN: [name], [name] (66 y.o. [name] Primary Care Physician: [name] Other Clinician: Referring Physician: Treating Physician/Extender: [name] in Treatment: 28 Education Assessment Education Provided To: Patient Education Topics Provided Infection: Methods: Explain/Verbal Responses: Reinforcements needed, State content correctly Venous: Methods: Explain/Verbal Responses: Reinforcements needed, State content correctly Electronic Signature(s) Signed: 03/14/2022 5:15:44 PM By: [name] RN, BSN Entered By: [name] on 03/14/2022 11:41:36 -------------------------------------------------------------------------------- Wound Assessment Details Patient Name: Date of Service: NA Colin [name] 03/14/2022 11:15 A M Medical Record Number: [id number] Patient Account Number: [id number] Date of Birth/Sex: Treating RN: [birth year] (66 y.o. [name] Primary Care Krissy Orebaugh: [name] Other Clinician: Referring Daesia Zylka: Treating Rolando Whitby/Extender: [name] Weeks in Treatment: 28 Wound Status Wound Number: 1 Primary Diabetic Wound/Ulcer of the Lower Extremity Etiology: Wound Location: Left, Medial Foot Wound Open Wounding Event: Gradually Appeared Status: Date Acquired: 02/22/2021 Comorbid Cataracts, Coronary Artery Disease, Hypertension, Myocardial Weeks Of Treatment: 28 History: Infarction, Peripheral Arterial Disease, Type II Diabetes, Clustered Wound: No Osteomyelitis,  Neuropathy Wound Measurements Length: (cm) 5.1 Width: (cm) 1.9 Depth: (cm) 0.1 Area: (cm) 7.611 Volume: (cm) 0.761 % Reduction in Area: 46.2% % Reduction in Volume: 94.6% Epithelialization: Small (1-33%) Tunneling: No Undermining: No Wound Description Classification: Grade 3 Wound Margin: Distinct, outline attached Exudate Amount: Medium Exudate Type: Purulent Exudate Color: yellow, brown, green Foul Odor After Cleansing: No Slough/Fibrino Yes Wound Bed Granulation Amount: Large (67-100%) Exposed Structure Granulation Quality: Red, Hyper-granulation Fascia Exposed: No Necrotic Amount: Small (1-33%) Fat Layer (Subcutaneous Tissue) Exposed: Yes Necrotic Quality: Adherent Slough Tendon Exposed: No Muscle Exposed: No  Joint Exposed: No Bone Exposed: No Treatment Notes Wound #1 (Foot) Wound Laterality: Left, Medial Cleanser Wound Cleanser Discharge Instruction: Cleanse the wound with wound cleanser prior to applying a clean dressing using gauze sponges, not tissue or cotton balls. Peri-Wound Care Sween Lotion (Moisturizing lotion) Discharge Instruction: Apply moisturizing lotion as directed Topical Gentamicin Discharge Instruction: As directed by physician Primary Dressing KerraCel Ag Gelling Fiber Dressing, 4x5 in (silver alginate) Discharge Instruction: Apply silver alginate to wound bed as instructed Secondary Dressing ABD Pad, 8x10 Discharge Instruction: Apply over primary dressing as directed. CarboFLEX Odor Control Dressing, 4x4 in Discharge Instruction: Apply over primary dressing as directed. Woven Gauze Sponge, Non-Sterile 4x4 in Discharge Instruction: In clinic. Home Health to apply wound vac. Secured With Compression Wrap Kerlix Roll 4.5x3.1 (in/yd) Discharge Instruction: Apply Kerlix and Coban compression as directed. Coban Self-Adherent Wrap 4x5 (in/yd) Discharge Instruction: Apply over Kerlix as directed. Compression Stockings Add-Ons Electronic  Signature(s) Signed: 03/14/2022 5:15:44 PM By: [name] RN, BSN Entered By: [name] on 03/14/2022 11:38:12 -------------------------------------------------------------------------------- Vitals Details Patient Name: Date of Service: NA Colin [name] 03/14/2022 11:15 A M Medical Record Number: [id number] Patient Account Number: [id number] Date of Birth/Sex: Treating RN: [name][birth year] (66 y.o. [name] Primary Care Laith Antonelli: [name] Other Clinician: Referring Krayton Wortley: Treating Ruperto Kiernan/Extender: [name] Weeks in Treatment: 28 Vital Signs Time Taken: 11:30 Reference Range: 80 - 120 mg / dl Height (in): 74 Weight (lbs): 186 Body Mass Index (BMI): 23.9 Notes see HBO encounter Electronic Signature(s) Signed: 03/14/2022 5:15:44 PM By: [name] RN, BSN Entered By: [name] on 03/14/2022 11:37:14

## 2022-03-14 NOTE — Progress Notes (Signed)
LAMINE, LATON ([id number]) Visit Report for 03/14/2022 Arrival Information Details Patient Name: Date of Service: NA [name] 03/14/2022 8:00 A M Medical Record Number: [id number] Patient Account Number: [id number] Date of Birth/Sex: Treating RN: [birth year] (66 y.o. [name] Primary Care Emaya Preston: [name] Other Clinician: [name] Referring Rosey Eide: Treating Jaklyn Alen/Extender: [name] in Treatment: 28 Visit Information History Since Last Visit All ordered tests and consults were completed: Yes Patient Arrived: Knee Scooter Added or deleted any medications: No Arrival Time: 08:35 Any new allergies or adverse reactions: No Accompanied By: Wife Had a fall or experienced change in No Transfer Assistance: None activities of daily living that may affect Patient Identification Verified: Yes risk of falls: Secondary Verification Process Completed: Yes Signs or symptoms of abuse/neglect since last visito No Patient Requires Transmission-Based Precautions: No Hospitalized since last visit: No Patient Has Alerts: Yes Implantable device outside of the clinic excluding No Patient Alerts: Patient on Blood Thinner cellular tissue based products placed in the center since last visit: Pain Present Now: No Electronic Signature(s) Signed: 03/14/2022 11:23:54 AM By: [name] EMT Previous Signature: 03/14/2022 11:22:30 AM Version By: [name] EMT Entered By: [name] on 03/14/2022 11:23:54 -------------------------------------------------------------------------------- Encounter Discharge Information Details Patient Name: Date of Service: NA Colin [name] 03/14/2022 8:00 A M Medical Record Number: [id number] Patient Account Number: [id number] Date of Birth/Sex: Treating RN: [birth year] (66 y.o. [name] Primary Care Linn Clavin: [name] Other Clinician: [name] Referring Rogue Rafalski: Treating  Asuzena Weis/Extender: [name] Weeks in Treatment: 28 Encounter Discharge Information Items Discharge Condition: Stable Ambulatory Status: Knee Scooter Discharge Destination: Home Transportation: Private Auto Accompanied By: Wife Schedule Follow-up Appointment: Yes Clinical Summary of Care: Electronic Signature(s) Signed: 03/14/2022 11:29:23 AM By: [name] EMT Entered By: [name] on 03/14/2022 11:29:23 -------------------------------------------------------------------------------- Vitals Details Patient Name: Date of Service: NA Colin [name], [name] 03/14/2022 8:00 A M Medical Record Number: [id number] Patient Account Number: [id number] Date of Birth/Sex: Treating RN: September 08, 1955 (66 y.o. [name] Primary Care Meah Jiron: [name] Other Clinician: [name] Referring Yamaira Spinner: Treating Fifi Schindler/Extender: [name] Weeks in Treatment: 28 Vital Signs Time Taken: 08:43 Temperature (F): 97.5 Height (in): 74 Pulse (bpm): 80 Weight (lbs): 186 Respiratory Rate (breaths/min): 16 Body Mass Index (BMI): 23.9 Blood Pressure (mmHg): 152/70 Capillary Blood Glucose (mg/dl): 188 Reference Range: 80 - 120 mg / dl Electronic Signature(s) Signed: 03/14/2022 11:24:22 AM By: [name] EMT Entered By: [name] on 03/14/2022 11:24:22

## 2022-03-15 ENCOUNTER — Encounter (HOSPITAL_BASED_OUTPATIENT_CLINIC_OR_DEPARTMENT_OTHER): Payer: BC Managed Care – PPO | Admitting: General Surgery

## 2022-03-15 DIAGNOSIS — I2510 Atherosclerotic heart disease of native coronary artery without angina pectoris: Secondary | ICD-10-CM | POA: Diagnosis not present

## 2022-03-15 DIAGNOSIS — Z951 Presence of aortocoronary bypass graft: Secondary | ICD-10-CM | POA: Diagnosis not present

## 2022-03-15 DIAGNOSIS — E1169 Type 2 diabetes mellitus with other specified complication: Secondary | ICD-10-CM | POA: Diagnosis not present

## 2022-03-15 DIAGNOSIS — L97324 Non-pressure chronic ulcer of left ankle with necrosis of bone: Secondary | ICD-10-CM | POA: Diagnosis not present

## 2022-03-15 DIAGNOSIS — E11621 Type 2 diabetes mellitus with foot ulcer: Secondary | ICD-10-CM | POA: Diagnosis not present

## 2022-03-15 DIAGNOSIS — E1140 Type 2 diabetes mellitus with diabetic neuropathy, unspecified: Secondary | ICD-10-CM | POA: Diagnosis not present

## 2022-03-15 DIAGNOSIS — E11610 Type 2 diabetes mellitus with diabetic neuropathic arthropathy: Secondary | ICD-10-CM | POA: Diagnosis not present

## 2022-03-15 DIAGNOSIS — M86672 Other chronic osteomyelitis, left ankle and foot: Secondary | ICD-10-CM | POA: Diagnosis not present

## 2022-03-15 LAB — GLUCOSE, CAPILLARY
Glucose-Capillary: 267 mg/dL — ABNORMAL HIGH (ref 70–99)
Glucose-Capillary: 243 mg/dL — ABNORMAL HIGH (ref 70–99)

## 2022-03-15 NOTE — Progress Notes (Signed)
KOAH, CHISENHALL ([id number]) Visit Report for 03/15/2022 Arrival Information Details Patient Name: Date of Service: NA [name] 03/15/2022 8:00 A M Medical Record Number: [id number] Patient Account Number: [id number] Date of Birth/Sex: Treating RN: 06-02-1956 (66 y.o. Mare Ferrari Primary Care Brandice Busser: [name] Other Clinician: [name] Referring Claris Pech: Treating Akaash Vandewater/Extender: [name] in Treatment: 28 Visit Information History Since Last Visit All ordered tests and consults were completed: Yes Patient Arrived: Knee Scooter Added or deleted any medications: No Arrival Time: 08:20 Any new allergies or adverse reactions: No Accompanied By: Wife Had a fall or experienced change in No Transfer Assistance: None activities of daily living that may affect Patient Identification Verified: Yes risk of falls: Secondary Verification Process Completed: Yes Signs or symptoms of abuse/neglect since last visito No Patient Requires Transmission-Based Precautions: No Hospitalized since last visit: No Patient Has Alerts: Yes Implantable device outside of the clinic excluding No Patient Alerts: Patient on Blood Thinner cellular tissue based products placed in the center since last visit: Pain Present Now: No Electronic Signature(s) Signed: 03/15/2022 1:08:02 PM By: [name] EMT Entered By: [name] on 03/15/2022 13:08:02 -------------------------------------------------------------------------------- Encounter Discharge Information Details Patient Name: Date of Service: [name] 03/15/2022 8:00 A M Medical Record Number: [id number] Patient Account Number: [id number] Date of Birth/Sex: Treating RN: 09-Apr-1956 (66 y.o. Mare Ferrari Primary Care Meliana Canner: [name] Other Clinician: [name] Referring Samiksha Pellicano: Treating Murry Khiev/Extender: [name] Weeks in Treatment: 28 Encounter  Discharge Information Items Discharge Condition: Stable Ambulatory Status: Knee Scooter Discharge Destination: Home Transportation: Private Auto Accompanied By: Wife Schedule Follow-up Appointment: Yes Clinical Summary of Care: Electronic Signature(s) Signed: 03/15/2022 1:15:11 PM By: [name] EMT Entered By: [name] on 03/15/2022 13:15:10 -------------------------------------------------------------------------------- Vitals Details Patient Name: Date of Service: NA Colin [name] 03/15/2022 8:00 A M Medical Record Number: [id number] Patient Account Number: [id number] Date of Birth/Sex: Treating RN: 1955/09/28 (66 y.o. Mare Ferrari Primary Care Romulus Hanrahan: [name] Other Clinician: [name] Referring Collene Massimino: Treating Ayris Carano/Extender: [name] Weeks in Treatment: 28 Vital Signs Time Taken: 08:31 Temperature (F): 97.3 Height (in): 74 Pulse (bpm): 81 Weight (lbs): 186 Respiratory Rate (breaths/min): 16 Body Mass Index (BMI): 23.9 Blood Pressure (mmHg): 132/80 Capillary Blood Glucose (mg/dl): 267 Reference Range: 80 - 120 mg / dl Electronic Signature(s) Signed: 03/15/2022 1:08:35 PM By: [name] EMT Entered By: [name] on 03/15/2022 13:08:35

## 2022-03-15 NOTE — Progress Notes (Addendum)
[name], RUSNAK ([id number]) Visit Report for 03/15/2022 HBO Details Patient Name: Date of Service: NA [name] 03/15/2022 8:00 A M Medical Record Number: [id number] Patient Account Number: [id number] Date of Birth/Sex: Treating RN: March 23, 1956 (66 y.o. Mare Ferrari Primary Care Tilton Marsalis: [name] Other Clinician: [name] Referring Kymberlie Brazeau: Treating Katherinne Mofield/Extender: [name] Weeks in Treatment: 28 HBO Treatment Course Details Treatment Course Number: 1 Ordering Tevin Shillingford: [name] T Treatments Ordered: otal 120 HBO Treatment Start Date: 09/20/2021 HBO Indication: Diabetic Ulcer(s) of the Lower Extremity Standard/Conservative Wound Care tried and failed greater than or equal to 30 days HBO Treatment Details Treatment Number: 112 Patient Type: Outpatient Chamber Type: Monoplace Chamber Serial #: [id number] Treatment Protocol: 2.0 ATA with 90 minutes oxygen, with two 5 minute air breaks Treatment Details Compression Rate Down: 2.0 psi / minute De-Compression Rate Up: 2.0 psi / minute A breaks and breathing ir Compress Tx Pressure periods Decompress Decompress Begins Reached (leave unused spaces Begins Ends blank) Chamber Pressure (ATA 1 2 2 2 2 2  --2 1 ) Clock Time (24 hr) 08:42 08:59 09:29 09:34 10:04 10:09 - - 10:39 10:51 Treatment Length: 129 (minutes) Treatment Segments: 4 Vital Signs Capillary Blood Glucose Reference Range: 80 - 120 mg / dl HBO Diabetic Blood Glucose Intervention Range: <131 mg/dl or >249 mg/dl Time Vitals Blood Respiratory Capillary Blood Glucose Pulse Action Type: Pulse: Temperature: Taken: Pressure: Rate: Glucose (mg/dl): Meter #: Oximetry (%) Taken: Pre 08:31 132/80 81 16 97.3 267 Post 10:54 169/96 68 18 97.2 243 Treatment Response Treatment Toleration: Well Treatment Completion Status: Treatment Completed without Adverse Event Additional Procedure Documentation Tissue Sevierity: Necrosis of  bone Physician HBO Attestation: I certify that I supervised this HBO treatment in accordance with Medicare guidelines. A trained emergency response team is readily available per Yes hospital policies and procedures. Continue HBOT as ordered. Yes Electronic Signature(s) Signed: 03/15/2022 1:57:13 PM By: [name] MD FACS Previous Signature: 03/15/2022 1:13:48 PM Version By: [name] EMT Entered By: [name] on 03/15/2022 13:57:12 -------------------------------------------------------------------------------- HBO Safety Checklist Details Patient Name: Date of Service: [name] A. 03/15/2022 8:00 A M Medical Record Number: [id number] Patient Account Number: [id number] Date of Birth/Sex: Treating RN: 06-Dec-1955 (65 y.o. Mare Ferrari Primary Care Mileydi Milsap: [name] Other Clinician: [name] Referring Keyion Knack: Treating Tonnette Zwiebel/Extender: [name] Weeks in Treatment: 28 HBO Safety Checklist Items Safety Checklist Consent Form Signed Patient voided / foley secured and emptied When did you last eato 0615 Last dose of injectable or oral agent 0615 Ostomy pouch emptied and vented if applicable NA All implantable devices assessed, documented and approved NA Intravenous access site secured and place NA Valuables secured Linens and cotton and cotton/polyester blend (less than 51% polyester) Personal oil-based products / skin lotions / body lotions removed Wigs or hairpieces removed NA Smoking or tobacco materials removed Books / newspapers / magazines / loose paper removed Cologne, aftershave, perfume and deodorant removed Jewelry removed (may wrap wedding band) NA Make-up removed NA Hair care products removed Battery operated devices (external) removed Heating patches and chemical warmers removed Titanium eyewear removed NA Nail polish cured greater than 10 hours NA Casting material cured greater than 10  hours NA Hearing aids removed NA Loose dentures or partials removed NA Prosthetics have been removed NA Patient demonstrates correct use of air break device (if applicable) Patient concerns have been addressed Patient grounding bracelet on and cord attached to chamber Specifics for Inpatients (complete  in addition to above) Medication sheet sent with patient NA Intravenous medications needed or due during therapy sent with patient NA Drainage tubes (e.g. nasogastric tube or chest tube secured and vented) NA Endotracheal or Tracheotomy tube secured NA Cuff deflated of air and inflated with saline NA Airway suctioned NA Notes The safety checklist was done before the treatment was started. Electronic Signature(s) Signed: 03/15/2022 1:09:50 PM By: [name] EMT Entered By: [name] on 03/15/2022 13:09:49

## 2022-03-15 NOTE — Progress Notes (Signed)
NAREK, KNISS ([id number]) Visit Report for 03/15/2022 Problem List Details Patient Name: Date of Service: NA [name] A. 03/15/2022 8:00 A M Medical Record Number: [id number] Patient Account Number: [id number] Date of Birth/Sex: Treating RN: Sep 29, 1955 (66 y.o. 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 03/15/2022 Medical Record Number: [id number] Patient Account Number: [id number] Date of Birth/Sex: Treating RN: July 02, 1955 (66 y.o. 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 E11.621 Type 2 diabetes  mellitus with foot ulcer I25.10 Atherosclerotic heart disease of native coronary artery without angina pectoris Facility Procedures CPT4 Code: [id number] Description: G0277-(Facility Use Only) HBOT full body chamber, 71min , ICD-10 Diagnosis Description E11.621 Type 2 diabetes mellitus with foot ulcer L97.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 M86.672 Other chronic  osteomyelitis, left ankle and foot 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 Modifier: Quantity: 4 Physician Procedures : CPT4 Code Description Modifier [id number] - WC PHYS HYPERBARIC OXYGEN THERAPY ICD-10 Diagnosis Description E11.621 Type 2 diabetes mellitus with foot ulcer L97.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

## 2022-03-16 ENCOUNTER — Encounter (HOSPITAL_BASED_OUTPATIENT_CLINIC_OR_DEPARTMENT_OTHER): Payer: BC Managed Care – PPO | Admitting: General Surgery

## 2022-03-16 DIAGNOSIS — E11610 Type 2 diabetes mellitus with diabetic neuropathic arthropathy: Secondary | ICD-10-CM | POA: Diagnosis not present

## 2022-03-16 DIAGNOSIS — L97324 Non-pressure chronic ulcer of left ankle with necrosis of bone: Secondary | ICD-10-CM | POA: Diagnosis not present

## 2022-03-16 DIAGNOSIS — E1140 Type 2 diabetes mellitus with diabetic neuropathy, unspecified: Secondary | ICD-10-CM | POA: Diagnosis not present

## 2022-03-16 DIAGNOSIS — Z951 Presence of aortocoronary bypass graft: Secondary | ICD-10-CM | POA: Diagnosis not present

## 2022-03-16 DIAGNOSIS — M86672 Other chronic osteomyelitis, left ankle and foot: Secondary | ICD-10-CM | POA: Diagnosis not present

## 2022-03-16 DIAGNOSIS — E11621 Type 2 diabetes mellitus with foot ulcer: Secondary | ICD-10-CM | POA: Diagnosis not present

## 2022-03-16 DIAGNOSIS — E1169 Type 2 diabetes mellitus with other specified complication: Secondary | ICD-10-CM | POA: Diagnosis not present

## 2022-03-16 DIAGNOSIS — I2510 Atherosclerotic heart disease of native coronary artery without angina pectoris: Secondary | ICD-10-CM | POA: Diagnosis not present

## 2022-03-16 LAB — GLUCOSE, CAPILLARY
Glucose-Capillary: 214 mg/dL — ABNORMAL HIGH (ref 70–99)
Glucose-Capillary: 205 mg/dL — ABNORMAL HIGH (ref 70–99)

## 2022-03-16 NOTE — Progress Notes (Signed)
[name], [name] ([id number]) Visit Report for 03/16/2022 SuperBill Details Patient Name: Date of Service: NA [name] 03/16/2022 Medical Record Number: [id number] Patient Account Number: [id number] Date of Birth/Sex: Treating RN: 06/28/1955 (66 y.o. 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

## 2022-03-16 NOTE — Progress Notes (Signed)
[name] Visit Report for 03/16/2022 Arrival Information Details Patient Name: Date of Service: NA [name] 03/16/2022 11:30 A M Medical Record Number: [id number] Patient Account Number: [id number] Date of Birth/Sex: Treating RN: [birth year] (66 y.o. [name] Primary Care Delmi Fulfer: [name] Other Clinician: Referring Lakeisha Waldrop: Treating Katrell Milhorn/Extender: [name] in Treatment: 29 Visit Information History Since Last Visit All ordered tests and consults were completed: Yes Patient Arrived: Knee Scooter Added or deleted any medications: No Arrival Time: 11:03 Any new allergies or adverse reactions: No Accompanied By: self Had a fall or experienced change in No Transfer Assistance: None activities of daily living that may affect Patient Requires Transmission-Based Precautions: No risk of falls: Patient Has Alerts: Yes Signs or symptoms of abuse/neglect since last visito No Patient Alerts: Patient on Blood Thinner Hospitalized since last visit: No Implantable device outside of the clinic excluding No cellular tissue based products placed in the center since last visit: Has Dressing in Place as Prescribed: Yes Pain Present Now: Yes Electronic Signature(s) Signed: 03/16/2022 5:03:36 PM By: Tommie Ard RN Entered By: Tommie Ard on 03/16/2022 11:04:44 -------------------------------------------------------------------------------- Clinic Level of Care Assessment Details Patient Name: Date of Service: [name] A. 03/16/2022 11:30 A M Medical Record Number: [id number] Patient Account Number: [id number] Date of Birth/Sex: Treating RN: 23-Jul-1955 (66 y.o. [name] Primary Care Jorgina Binning: [name] Other Clinician: Referring Kresha Abelson: Treating Saajan Willmon/Extender: [name] in Treatment: 29 Clinic Level of Care Assessment Items TOOL 4 Quantity Score X- 1 0 Use when only an EandM  is performed on FOLLOW-UP visit ASSESSMENTS - Nursing Assessment / Reassessment []  - 0 Reassessment of Co-morbidities (includes updates in patient status) []  - 0 Reassessment of Adherence to Treatment Plan ASSESSMENTS - Wound and Skin A ssessment / Reassessment X - Simple Wound Assessment / Reassessment - one wound 1 5 []  - 0 Complex Wound Assessment / Reassessment - multiple wounds []  - 0 Dermatologic / Skin Assessment (not related to wound area) ASSESSMENTS - Focused Assessment X- 1 5 Circumferential Edema Measurements - multi extremities []  - 0 Nutritional Assessment / Counseling / Intervention X- 1 5 Lower Extremity Assessment (monofilament, tuning fork, pulses) []  - 0 Peripheral Arterial Disease Assessment (using hand held doppler) ASSESSMENTS - Ostomy and/or Continence Assessment and Care []  - 0 Incontinence Assessment and Management []  - 0 Ostomy Care Assessment and Management (repouching, etc.) PROCESS - Coordination of Care []  - 0 Simple Patient / Family Education for ongoing care []  - 0 Complex (extensive) Patient / Family Education for ongoing care []  - 0 Staff obtains , Records, T Results / Process Orders est []  - 0 Staff telephones HHA, Nursing Homes / Clarify orders / etc []  - 0 Routine Transfer to another Facility (non-emergent condition) []  - 0 Routine Hospital Admission (non-emergent condition) []  - 0 New Admissions / / Ordering NPWT Apligraf, etc. , []  - 0 Emergency Hospital Admission (emergent condition) X- 1 10 Simple Discharge Coordination []  - 0 Complex (extensive) Discharge Coordination PROCESS - Special Needs []  - 0 Pediatric / Minor Patient Management []  - 0 Isolation Patient Management []  - 0 Hearing / Language / Visual special needs []  - 0 Assessment of Community assistance (transportation, D/C planning, etc.) []  - 0 Additional assistance / Altered mentation []  - 0 Support Surface(s) Assessment  (bed, cushion, seat, etc.) INTERVENTIONS - Wound Cleansing / Measurement X - Simple Wound Cleansing - one wound 1  5 []  - 0 Complex Wound Cleansing - multiple wounds []  - 0 Wound Imaging (photographs - any number of wounds) []  - 0 Wound Tracing (instead of photographs) []  - 0 Simple Wound Measurement - one wound []  - 0 Complex Wound Measurement - multiple wounds INTERVENTIONS - Wound Dressings []  - 0 Small Wound Dressing one or multiple wounds X- 1 15 Medium Wound Dressing one or multiple wounds []  - 0 Large Wound Dressing one or multiple wounds []  - 0 Application of Medications - topical []  - 0 Application of Medications - injection INTERVENTIONS - Miscellaneous []  - 0 External ear exam []  - 0 Specimen Collection (cultures, biopsies, blood, body fluids, etc.) []  - 0 Specimen(s) / Culture(s) sent or taken to Lab for analysis []  - 0 Patient Transfer (multiple staff / / Similar devices) []  - 0 Simple Staple / Suture removal (25 or less) []  - 0 Complex Staple / Suture removal (26 or more) []  - 0 Hypo / Hyperglycemic Management (close monitor of Blood Glucose) []  - 0 Ankle / Brachial Index (ABI) - do not check if billed separately X- 1 5 Vital Signs Has the patient been seen at the hospital within the last three years: Yes Total Score: 50 Level Of Care: New/Established - Level 2 Electronic Signature(s) Signed: 03/16/2022 5:03:36 PM By: RN Entered By: on 03/16/2022 11:22:10 -------------------------------------------------------------------------------- Encounter Discharge Information Details Patient Name: Date of Service: A. 03/16/2022 11:30 A M Medical Record Number: Patient Account Number: Date of Birth/Sex: Treating RN: [birth year] (66 y.o. Primary Care Shavanna Furnari: Other Clinician: Referring Rakeem Colley: Treating Rahi Chandonnet/Extender: Nurse, adult in Treatment: 29 Encounter Discharge Information Items Discharge Condition: Stable Ambulatory Status: Knee Scooter Discharge Destination: Home Transportation: Private Auto Accompanied By: self Schedule Follow-up Appointment: Yes Clinical Summary of Care: Patient Declined Electronic Signature(s) Signed: 03/16/2022 5:03:36 PM By: RN Entered By: on 03/16/2022 11:21:26 -------------------------------------------------------------------------------- Patient/Caregiver Education Details Patient Name: Date of Service: NA Tommie Ard 10/4/2023andnbsp11:30 A M Medical Record Number: 05/16/2022 Patient Account Number: [name] Date of Birth/Gender: Treating RN: 1956-02-11 (66 y.o. [id number] Primary Care Physician: 08/09/1955 Other Clinician: Referring Physician: Treating Physician/Extender: 77 in Treatment: 29 Education Assessment Education Provided To: Patient Education Topics Provided Elevated Blood Sugar/ Impact on Healing: Methods: Explain/Verbal Responses: Reinforcements needed, State content correctly Infection: Methods: Explain/Verbal Responses: Reinforcements needed, State content correctly Electronic Signature(s) Signed: 03/16/2022 5:03:36 PM By: [name] RN Entered By: 30 on 03/16/2022 11:15:28 -------------------------------------------------------------------------------- Wound Assessment Details Patient Name: Date of Service: Tommie Ard A. 03/16/2022 11:30 A M Medical Record Number: 05/16/2022 Patient Account Number: [name] Date of Birth/Sex: Treating RN: 08/05/1955 (66 y.o. [id number] Primary Care Evangelynn Lochridge: 08/09/1955 Other Clinician: Referring Chizaram Latino: Treating Yliana Gravois/Extender: 77 Weeks in Treatment: 29 Wound Status Wound Number: 1 Primary Diabetic Wound/Ulcer of the Lower Extremity Etiology: Wound Location: Left,  Medial Foot Wound Open Wounding Event: Gradually Appeared Status: Date Acquired: 02/22/2021 Comorbid Cataracts, Coronary Artery Disease, Hypertension, Myocardial Weeks Of Treatment: 29 History: Infarction, Peripheral Arterial Disease, Type II Diabetes, Clustered Wound: No Osteomyelitis, Neuropathy Wound Measurements Length: (cm) 4.6 Width: (cm) 1.5 Depth: (cm) 0.1 Area: (cm) 5.419 Volume: (cm) 0.542 % Reduction in Area: 61.7% % Reduction in Volume: 96.2% Epithelialization: Small (1-33%) Tunneling: No Undermining: No Wound Description Classification: Grade 3 Wound Margin:  Distinct, outline attached Exudate Amount: Medium Exudate Type: Purulent Exudate Color: yellow, brown, green Foul Odor After Cleansing: No Slough/Fibrino Yes Wound Bed Granulation Amount: Large (67-100%) Exposed Structure Granulation Quality: Red, Hyper-granulation Fascia Exposed: No Necrotic Amount: Small (1-33%) Fat Layer (Subcutaneous Tissue) Exposed: Yes Necrotic Quality: Adherent Slough Tendon Exposed: No Muscle Exposed: No Joint Exposed: No Bone Exposed: No Periwound Skin Texture Texture Color No Abnormalities Noted: No No Abnormalities Noted: No Callus: No Temperature / Pain Crepitus: No Temperature: Cool/Cold Excoriation: No Induration: No Rash: No Scarring: No Moisture No Abnormalities Noted: No Dry / Scaly: Yes Maceration: Yes Treatment Notes Wound #1 (Foot) Wound Laterality: Left, Medial Cleanser Wound Cleanser Discharge Instruction: Cleanse the wound with wound cleanser prior to applying a clean dressing using gauze sponges, not tissue or cotton balls. Peri-Wound Care Topical Primary Dressing Apligraf Discharge Instruction: DO NOT REMOVE Secondary Dressing Woven Gauze Sponge, Non-Sterile 4x4 in Discharge Instruction: In clinic. Home Health to apply wound vac. Secured With Elastic Bandage 4 inch (ACE bandage) Discharge Instruction: Secure with ACE bandage as  directed. Kerlix Roll Sterile, 4.5x3.1 (in/yd) Discharge Instruction: In clinic. Home Health to apply wound vac. 93M Medipore H Soft Cloth Surgical T ape, 4 x 10 (in/yd) Discharge Instruction: In clinic. Home Health to apply wound vac. Compression Wrap Compression Stockings Add-Ons Electronic Signature(s) Signed: 03/16/2022 5:03:36 PM By: Blanche East RN Entered By: Blanche East on 03/16/2022 11:14:45 -------------------------------------------------------------------------------- Vitals Details Patient Name: Date of Service: NA [name] 03/16/2022 11:30 A M Medical Record Number: [id number] Patient Account Number: [id number] Date of Birth/Sex: Treating RN: 01-Dec-1955 (66 y.o. [name] Primary Care Nastacia Raybuck: [name] Other Clinician: Referring Wildon Cuevas: Treating Jazzie Trampe/Extender: [name] Weeks in Treatment: 29 Vital Signs Time Taken: 11:04 Temperature (F): 97.3 Height (in): 74 Pulse (bpm): 72 Weight (lbs): 186 Respiratory Rate (breaths/min): 18 Body Mass Index (BMI): 23.9 Blood Pressure (mmHg): 161/89 Reference Range: 80 - 120 mg / dl Electronic Signature(s) Signed: 03/16/2022 5:03:36 PM By: Blanche East RN Entered By: Blanche East on 03/16/2022 11:05:09

## 2022-03-16 NOTE — Progress Notes (Addendum)
[name], [name] ([id number]) Visit Report for 03/16/2022 HBO Details Patient Name: Date of Service: [name] 03/16/2022 8:00 A M Medical Record Number: [id number] Patient Account Number: [id number] Date of Birth/Sex: Treating RN: [birth year] (66 y.o. [name] [name]: [name] Other Clinician: Valeria [name] Referring [name]: Treating [name]: [name] [name]: 77 HBO [name] Course Details [name] Course Number: 1 Ordering [name]: [name] T Treatments Ordered: otal 120 HBO [name] Start Date: 09/20/2021 HBO Indication: Diabetic Ulcer(s) of the Lower Extremity Standard/Conservative Wound [name] tried and failed greater than or equal to 30 days HBO [name] Details [name] Number: [id number] Patient Type: Outpatient Chamber Type: Monoplace Chamber Serial #: [id number] [name] Protocol: 2.0 ATA with 90 minutes oxygen, with two 5 minute air breaks [name] Details Compression Rate Down: 2.0 psi / minute De-Compression Rate Up: 2.0 psi / minute A breaks and breathing ir Compress Tx Pressure periods Decompress Decompress Begins Reached (leave unused spaces Begins Ends blank) Chamber Pressure (ATA 1 2 2 2 2 2  --2 1 ) Clock Time (24 hr) 08:34 08:48 09:18 09:23 09:53 09:58 - - 10:28 10:37 [name] Length: 123 (minutes) [name] Segments: 4 Vital Signs Capillary Blood Glucose Reference Range: 80 - 120 mg / dl HBO Diabetic Blood Glucose Intervention Range: <131 mg/dl or >249 mg/dl Time Vitals Blood Respiratory Capillary Blood Glucose Pulse Action Type: Pulse: Temperature: Taken: Pressure: Rate: Glucose (mg/dl): Meter #: Oximetry (%) Taken: Pre 08:28 131/69 87 16 97.7 214 Post 10:43 161/89 72 18 97.3 205 [name] Response [name] Toleration: Well [name] Completion Status: [name] Completed without Adverse Event Additional Procedure Documentation Tissue Sevierity: Necrosis of  bone Physician HBO Attestation: I certify that I supervised this HBO [name] in accordance with Medicare guidelines. A trained emergency response team is readily available per Yes hospital policies and procedures. Continue HBOT as ordered. Yes Electronic Signature(s) Signed: 03/16/2022 4:57:53 PM By: Colin Maudlin MD FACS Previous Signature: 03/16/2022 3:11:34 PM Version By: [name] EMT Entered By: [name] on 03/16/2022 16:57:52 -------------------------------------------------------------------------------- HBO Safety Checklist Details Patient Name: Date of Service: Colin Loveless A. 03/16/2022 8:00 A M Medical Record Number: [id number] Patient Account Number: [id number] Date of Birth/Sex: Treating RN: December 16, 1955 (66 y.o. [name] [name]: [name] Other Clinician: Valeria [name] Referring Clydean Posas: Treating Amori Cooperman/Extender: [name] [name]: 29 HBO Safety Checklist Items Safety Checklist Consent Form Signed Patient voided / foley secured and emptied When did you last eato 0630 Last dose of injectable or oral agent 0630 Ostomy pouch emptied and vented if applicable NA All implantable devices assessed, documented and approved NA Intravenous access site secured and place NA Valuables secured Linens and cotton and cotton/polyester blend (less than 51% polyester) Personal oil-based products / skin lotions / body lotions removed Wigs or hairpieces removed NA Smoking or tobacco materials removed Books / newspapers / magazines / loose paper removed Cologne, aftershave, perfume and deodorant removed Jewelry removed (may wrap wedding band) NA Make-up removed NA Hair [name] products removed Battery operated devices (external) removed Heating patches and chemical warmers removed Titanium eyewear removed NA Nail polish cured greater than 10 hours NA Casting material cured greater than 10  hours NA Hearing aids removed NA Loose dentures or partials removed NA Prosthetics have been removed NA Patient demonstrates correct use of air break device (if applicable) Patient concerns have been addressed Patient grounding bracelet on and cord attached to chamber Specifics for Inpatients (complete  in addition to above) Medication sheet sent with patient NA Intravenous medications needed or due during therapy sent with patient NA Drainage tubes (e.g. nasogastric tube or chest tube secured and vented) NA Endotracheal or Tracheotomy tube secured NA Cuff deflated of air and inflated with saline NA Airway suctioned NA Notes The safety checklist was done before the [name] was started. Electronic Signature(s) Signed: 03/16/2022 3:10:04 PM By: [name] EMT Entered By: [name] on 03/16/2022 15:10:04

## 2022-03-16 NOTE — Progress Notes (Signed)
[name], [name] ([id number]) Visit Report for 03/16/2022 Problem List Details Patient Name: Date of Service: NA [name] 03/16/2022 8:00 A M Medical Record Number: [id number] Patient Account Number: [id number] Date of Birth/Sex: Treating RN: May 16, 1956 (66 y.o. 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 03/16/2022 Medical Record Number: [id number] Patient Account Number: [id number] Date of Birth/Sex: Treating RN: 1955-06-25 (66 y.o. 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 E11.621 Type 2 diabetes  mellitus with foot ulcer I25.10 Atherosclerotic heart disease of native coronary artery without angina pectoris Facility Procedures CPT4 Code: [id number] Description: G0277-(Facility Use Only) HBOT full body chamber, 6min , ICD-10 Diagnosis Description E11.621 Type 2 diabetes mellitus with foot ulcer 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 M86.672 Other chronic  osteomyelitis, left ankle and foot 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 Modifier: Quantity: 4 Physician Procedures : CPT4 Code Description Modifier [id number] - WC PHYS HYPERBARIC OXYGEN THERAPY ICD-10 Diagnosis Description E11.621 Type 2 diabetes mellitus with foot ulcer L97.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

## 2022-03-16 NOTE — Progress Notes (Addendum)
ADOM, SCHOENECK ([id number]) Visit Report for 03/16/2022 Arrival Information Details Patient Name: Date of Service: NA [name] 03/16/2022 8:00 A M Medical Record Number: [id number] Patient Account Number: [id number] Date of Birth/Sex: Treating RN: [birth year] (66 y.o. [name], Tammi Klippel Primary Care Emelly Wurtz: [name] Other Clinician: [name] Referring Jaleisa Brose: Treating Pamelyn Bancroft/Extender: [name] in Treatment: 29 Visit Information History Since Last Visit All ordered tests and consults were completed: Yes Patient Arrived: Knee Scooter Added or deleted any medications: No Arrival Time: 08:20 Any new allergies or adverse reactions: No Accompanied By: Wife Had a fall or experienced change in No Transfer Assistance: None activities of daily living that may affect Patient Identification Verified: Yes risk of falls: Secondary Verification Process Completed: Yes Signs or symptoms of abuse/neglect since last visito No Patient Requires Transmission-Based Precautions: No Hospitalized since last visit: No Patient Has Alerts: Yes Implantable device outside of the clinic excluding No Patient Alerts: Patient on Blood Thinner cellular tissue based products placed in the center since last visit: Pain Present Now: No Electronic Signature(s) Signed: 03/16/2022 3:08:39 PM By: [name] EMT Entered By: [name] on 03/16/2022 15:08:38 -------------------------------------------------------------------------------- Encounter Discharge Information Details Patient Name: Date of Service: NA [name] 03/16/2022 8:00 A M Medical Record Number: [id number] Patient Account Number: [id number] Date of Birth/Sex: Treating RN: 15-Feb-1956 (66 y.o. [name] Primary Care Tamer Baughman: [name] Other Clinician: [name] Referring Orli Degrave: Treating Valoria Tamburri/Extender: [name] Weeks in Treatment: 29 Encounter Discharge  Information Items Discharge Condition: Stable Ambulatory Status: Knee Scooter Discharge Destination: Home Transportation: Private Auto Accompanied By: None Schedule Follow-up Appointment: Yes Clinical Summary of Care: Electronic Signature(s) Signed: 03/16/2022 3:12:39 PM By: [name] EMT Entered By: [name] on 03/16/2022 15:12:39 -------------------------------------------------------------------------------- Vitals Details Patient Name: Date of Service: NA [name] 03/16/2022 8:00 A M Medical Record Number: [id number] Patient Account Number: [id number] Date of Birth/Sex: Treating RN: [birth year] (66 y.o. [name], Tammi Klippel Primary Care Bueford Arp: [name] Other Clinician: [name] Referring Schuyler Olden: Treating Barb Shear/Extender: [name] Weeks in Treatment: 29 Vital Signs Time Taken: 08:28 Temperature (F): 97.7 Height (in): 74 Pulse (bpm): 87 Weight (lbs): 186 Respiratory Rate (breaths/min): 16 Body Mass Index (BMI): 23.9 Blood Pressure (mmHg): 131/69 Capillary Blood Glucose (mg/dl): 214 Reference Range: 80 - 120 mg / dl Electronic Signature(s) Signed: 03/16/2022 3:09:10 PM By: [name] EMT Entered By: [name] on 03/16/2022 15:09:10

## 2022-03-17 ENCOUNTER — Encounter (HOSPITAL_BASED_OUTPATIENT_CLINIC_OR_DEPARTMENT_OTHER): Payer: BC Managed Care – PPO | Admitting: General Surgery

## 2022-03-17 DIAGNOSIS — E1140 Type 2 diabetes mellitus with diabetic neuropathy, unspecified: Secondary | ICD-10-CM | POA: Diagnosis not present

## 2022-03-17 DIAGNOSIS — M86672 Other chronic osteomyelitis, left ankle and foot: Secondary | ICD-10-CM | POA: Diagnosis not present

## 2022-03-17 DIAGNOSIS — L97324 Non-pressure chronic ulcer of left ankle with necrosis of bone: Secondary | ICD-10-CM | POA: Diagnosis not present

## 2022-03-17 DIAGNOSIS — E11621 Type 2 diabetes mellitus with foot ulcer: Secondary | ICD-10-CM | POA: Diagnosis not present

## 2022-03-17 DIAGNOSIS — I2510 Atherosclerotic heart disease of native coronary artery without angina pectoris: Secondary | ICD-10-CM | POA: Diagnosis not present

## 2022-03-17 DIAGNOSIS — E1169 Type 2 diabetes mellitus with other specified complication: Secondary | ICD-10-CM | POA: Diagnosis not present

## 2022-03-17 DIAGNOSIS — Z951 Presence of aortocoronary bypass graft: Secondary | ICD-10-CM | POA: Diagnosis not present

## 2022-03-17 DIAGNOSIS — E11610 Type 2 diabetes mellitus with diabetic neuropathic arthropathy: Secondary | ICD-10-CM | POA: Diagnosis not present

## 2022-03-17 LAB — GLUCOSE, CAPILLARY
Glucose-Capillary: 220 mg/dL — ABNORMAL HIGH (ref 70–99)
Glucose-Capillary: 224 mg/dL — ABNORMAL HIGH (ref 70–99)

## 2022-03-17 NOTE — Progress Notes (Addendum)
Colin, [name] ([id number]) Visit Report for 03/17/2022 HBO Details Patient Name: Date of Service: NA [name] 03/17/2022 8:00 A M Medical Record Number: [id number] Patient Account Number: [id number] Date of Birth/Sex: Treating RN: [birth year] (66 y.o. [name] Primary Care Janeshia Ciliberto: [name] Other Clinician: [name] Referring Natalio Salois: Treating Solange Emry/Extender: [name] Weeks in Treatment: 29 HBO Treatment Course Details Treatment Course Number: 1 Ordering Varnell Orvis: [name] T Treatments Ordered: otal 120 HBO Treatment Start Date: 09/20/2021 HBO Indication: Diabetic Ulcer(s) of the Lower Extremity Standard/Conservative Wound Care tried and failed greater than or equal to 30 days HBO Treatment Details Treatment Number: 027 Patient Type: Outpatient Chamber Type: Monoplace Chamber Serial #: [id number] Treatment Protocol: 2.0 ATA with 90 minutes oxygen, with two 5 minute air breaks Treatment Details Compression Rate Down: 2.0 psi / minute De-Compression Rate Up: 2.0 psi / minute A breaks and breathing ir Compress Tx Pressure periods Decompress Decompress Begins Reached (leave unused spaces Begins Ends blank) Chamber Pressure (ATA 1 2 2 2 2 2  --2 1 ) Clock Time (24 hr) 08:48 09:00 09:30 09:35 10:05 10:10 - - 10:40 10:51 Treatment Length: 123 (minutes) Treatment Segments: 4 Vital Signs Capillary Blood Glucose Reference Range: 80 - 120 mg / dl HBO Diabetic Blood Glucose Intervention Range: <131 mg/dl or >249 mg/dl Time Vitals Blood Respiratory Capillary Blood Glucose Pulse Action Type: Pulse: Temperature: Taken: Pressure: Rate: Glucose (mg/dl): Meter #: Oximetry (%) Taken: Pre 08:42 125/73 88 16 97.7 220 Post 10:54 181/92 97 18 97.5 224 Treatment Response Treatment Toleration: Well Treatment Completion Status: Treatment Completed without Adverse Event Additional Procedure Documentation Tissue Sevierity: Necrosis of  bone Physician HBO Attestation: I certify that I supervised this HBO treatment in accordance with Medicare guidelines. A trained emergency response team is readily available per Yes hospital policies and procedures. Continue HBOT as ordered. Yes Electronic Signature(s) Signed: 03/18/2022 8:16:56 AM By: [name] MD FACS Previous Signature: 03/17/2022 4:32:08 PM Version By: [name] EMT Entered By: [name] on 03/18/2022 08:16:56 -------------------------------------------------------------------------------- HBO Safety Checklist Details Patient Name: Date of Service: NA [name] A. 03/17/2022 8:00 A M Medical Record Number: [id number] Patient Account Number: [id number] Date of Birth/Sex: Treating RN: 1956/03/07 (66 y.o. [name] Primary Care Misha Antonini: [name] Other Clinician: [name] Referring Rhett Mutschler: Treating Sincere Berlanga/Extender: [name] Weeks in Treatment: 29 HBO Safety Checklist Items Safety Checklist Consent Form Signed Patient voided / foley secured and emptied When did you last eato 0630 Last dose of injectable or oral agent 0630 Ostomy pouch emptied and vented if applicable NA All implantable devices assessed, documented and approved NA Intravenous access site secured and place NA Valuables secured Linens and cotton and cotton/polyester blend (less than 51% polyester) Personal oil-based products / skin lotions / body lotions removed Wigs or hairpieces removed NA Smoking or tobacco materials removed Books / newspapers / magazines / loose paper removed Cologne, aftershave, perfume and deodorant removed Jewelry removed (may wrap wedding band) NA Make-up removed NA Hair care products removed Battery operated devices (external) removed Heating patches and chemical warmers removed Titanium eyewear removed NA Nail polish cured greater than 10 hours NA Casting material cured greater than 10  hours NA Hearing aids removed NA Loose dentures or partials removed NA Prosthetics have been removed NA Patient demonstrates correct use of air break device (if applicable) Patient concerns have been addressed Patient grounding bracelet on and cord attached to chamber Specifics for Inpatients (complete  in addition to above) Medication sheet sent with patient NA Intravenous medications needed or due during therapy sent with patient NA Drainage tubes (e.g. nasogastric tube or chest tube secured and vented) NA Endotracheal or Tracheotomy tube secured NA Cuff deflated of air and inflated with saline NA Airway suctioned NA Notes The safety checklist was done before the treatment was started. Electronic Signature(s) Signed: 03/17/2022 4:29:33 PM By: [name] EMT Entered By: [name] on 03/17/2022 16:29:33

## 2022-03-17 NOTE — Progress Notes (Signed)
BOWDY, BAIR ([id number]) Visit Report for 03/17/2022 Arrival Information Details Patient Name: Date of Service: NA [name] 03/17/2022 8:00 A M Medical Record Number: [id number] Patient Account Number: [id number] Date of Birth/Sex: Treating RN: [birth year] (66 y.o. [name] Primary Care Deddrick Saindon: [name] Other Clinician: [name] Referring Contrina Orona: Treating Rickia Freeburg/Extender: [name] in Treatment: 29 Visit Information History Since Last Visit All ordered tests and consults were completed: Yes Patient Arrived: Knee Scooter Added or deleted any medications: No Arrival Time: 08:31 Any new allergies or adverse reactions: No Accompanied By: Wife Had a fall or experienced change in No Transfer Assistance: None activities of daily living that may affect Patient Identification Verified: Yes risk of falls: Secondary Verification Process Completed: Yes Signs or symptoms of abuse/neglect since last visito No Patient Requires Transmission-Based Precautions: No Hospitalized since last visit: No Patient Has Alerts: Yes Implantable device outside of the clinic excluding No Patient Alerts: Patient on Blood Thinner cellular tissue based products placed in the center since last visit: Pain Present Now: No Electronic Signature(s) Signed: 03/17/2022 4:25:53 PM By: [name] EMT Entered By: [name] on 03/17/2022 16:25:52 -------------------------------------------------------------------------------- Encounter Discharge Information Details Patient Name: Date of Service: [name] 03/17/2022 8:00 A M Medical Record Number: [id number] Patient Account Number: [id number] Date of Birth/Sex: Treating RN: [birth year] (66 y.o. [name] Primary Care Kylor Valverde: [name] Other Clinician: [name] Referring Saphia Vanderford: Treating Rhian Funari/Extender: [name] Weeks in Treatment: 29 Encounter Discharge  Information Items Discharge Condition: Stable Ambulatory Status: Knee Scooter Discharge Destination: Home Transportation: Other Accompanied By: Wife Schedule Follow-up Appointment: Yes Clinical Summary of Care: Electronic Signature(s) Signed: 03/17/2022 4:34:00 PM By: [name] EMT Entered By: [name] on 03/17/2022 16:34:00 -------------------------------------------------------------------------------- Vitals Details Patient Name: Date of Service: NA [name] A. 03/17/2022 8:00 A M Medical Record Number: [id number] Patient Account Number: [id number] Date of Birth/Sex: Treating RN: November 19, 1955 (66 y.o. [name] Primary Care Luanna Weesner: [name] Other Clinician: [name] Referring Shamarcus Hoheisel: Treating Bethanny Toelle/Extender: [name] Weeks in Treatment: 29 Vital Signs Time Taken: 08:42 Temperature (F): 97.7 Height (in): 74 Pulse (bpm): 88 Weight (lbs): 186 Respiratory Rate (breaths/min): 16 Body Mass Index (BMI): 23.9 Blood Pressure (mmHg): 125/73 Capillary Blood Glucose (mg/dl): 220 Reference Range: 80 - 120 mg / dl Electronic Signature(s) Signed: 03/17/2022 4:26:19 PM By: [name] EMT Entered By: [name] on 03/17/2022 16:26:19

## 2022-03-18 ENCOUNTER — Encounter (HOSPITAL_BASED_OUTPATIENT_CLINIC_OR_DEPARTMENT_OTHER): Payer: BC Managed Care – PPO | Admitting: General Surgery

## 2022-03-18 DIAGNOSIS — E11610 Type 2 diabetes mellitus with diabetic neuropathic arthropathy: Secondary | ICD-10-CM | POA: Diagnosis not present

## 2022-03-18 DIAGNOSIS — E1140 Type 2 diabetes mellitus with diabetic neuropathy, unspecified: Secondary | ICD-10-CM | POA: Diagnosis not present

## 2022-03-18 DIAGNOSIS — M86672 Other chronic osteomyelitis, left ankle and foot: Secondary | ICD-10-CM | POA: Diagnosis not present

## 2022-03-18 DIAGNOSIS — I2510 Atherosclerotic heart disease of native coronary artery without angina pectoris: Secondary | ICD-10-CM | POA: Diagnosis not present

## 2022-03-18 DIAGNOSIS — E1169 Type 2 diabetes mellitus with other specified complication: Secondary | ICD-10-CM | POA: Diagnosis not present

## 2022-03-18 DIAGNOSIS — Z951 Presence of aortocoronary bypass graft: Secondary | ICD-10-CM | POA: Diagnosis not present

## 2022-03-18 DIAGNOSIS — L97324 Non-pressure chronic ulcer of left ankle with necrosis of bone: Secondary | ICD-10-CM | POA: Diagnosis not present

## 2022-03-18 DIAGNOSIS — E11621 Type 2 diabetes mellitus with foot ulcer: Secondary | ICD-10-CM | POA: Diagnosis not present

## 2022-03-18 LAB — GLUCOSE, CAPILLARY
Glucose-Capillary: 196 mg/dL — ABNORMAL HIGH (ref 70–99)
Glucose-Capillary: 197 mg/dL — ABNORMAL HIGH (ref 70–99)

## 2022-03-18 NOTE — Progress Notes (Signed)
CHANZ, CAHALL ([id number]) Visit Report for 03/17/2022 Problem List Details Patient Name: Date of Service: NA [name] 03/17/2022 8:00 A M Medical Record Number: [id number] Patient Account Number: [id number] Date of Birth/Sex: Treating RN: [birth year] (66 y.o. 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 03/17/2022 Medical Record Number: [id number] Patient Account Number: [id number] Date of Birth/Sex: Treating RN: [birth year] (66 y.o. 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 E11.621 Type 2 diabetes  mellitus with foot ulcer I25.10 Atherosclerotic heart disease of native coronary artery without angina pectoris Facility Procedures CPT4 Code: [id number] Description: G0277-(Facility Use Only) HBOT full body chamber, 15min , ICD-10 Diagnosis Description E11.621 Type 2 diabetes mellitus with foot ulcer 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 M86.672 Other chronic  osteomyelitis, left ankle and foot 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 Modifier: Quantity: 4 Physician Procedures : CPT4 Code Description Modifier [id number] - WC PHYS HYPERBARIC OXYGEN THERAPY ICD-10 Diagnosis Description E11.621 Type 2 diabetes mellitus with foot ulcer 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

## 2022-03-18 NOTE — Progress Notes (Signed)
MARIO, CORONADO ([id number]) Visit Report for 03/18/2022 Chief Complaint Document Details Patient Name: Date of Service: NA [name] 03/18/2022 10:45 A M Medical Record Number: [id number] Patient Account Number: [id number] Date of Birth/Sex: Treating RN: 25-Oct-1955 (66 y.o. [name] Primary Care Provider: [name] Other Clinician: Referring Provider: Treating Provider/Extender: [name] in Treatment: 29 Information Obtained from: Patient Chief Complaint Patients presents for treatment of an open diabetic ulcer with exposed bone and osteomyelitis Electronic Signature(s) Signed: 03/18/2022 11:14:58 AM By: [name] MD FACS Entered By: [name] on 03/18/2022 11:14:57 -------------------------------------------------------------------------------- Debridement Details Patient Name: Date of Service: NA [name] 03/18/2022 10:45 A M Medical Record Number: [id number] Patient Account Number: [id number] Date of Birth/Sex: Treating RN: 10/20/1955 (66 y.o. [name] Primary Care Provider: [name] Other Clinician: Referring Provider: Treating Provider/Extender: [name]: 29 Debridement Performed for Assessment: Wound #1 Left,Medial Foot Performed By: Physician [name], MD Debridement Type: Debridement Severity of Tissue Pre Debridement: Fat layer exposed Level of Consciousness (Pre-procedure): Awake and Alert Pre-procedure Verification/Time Out Yes - 10:59 Taken: Start Time: 10:59 Pain Control: Lidocaine 4% T opical Solution T Area Debrided (L x W): otal 4.3 (cm) x 1.5 (cm) = 6.45 (cm) Tissue and other material debrided: Non-Viable, Eschar, Slough, Slough Level: Non-Viable Tissue Debridement Description: Selective/Open Wound Instrument: Curette Bleeding: Minimum Hemostasis Achieved: Pressure Procedural Pain: 0 Post Procedural Pain: 0 Response to  Treatment: Procedure was tolerated well Level of Consciousness (Post- Awake and Alert procedure): Post Debridement Measurements of Total Wound Length: (cm) 4.3 Width: (cm) 1.5 Depth: (cm) 0.1 Volume: (cm) 0.507 Character of Wound/Ulcer Post Debridement: Improved Severity of Tissue Post Debridement: Fat layer exposed Post Procedure Diagnosis Same as Pre-procedure Notes scribed for Dr. Lady Gary by Samuella Bruin, RN Electronic Signature(s) Signed: 03/18/2022 12:30:23 PM By: [name] MD FACS Signed: 03/18/2022 4:25:48 PM By: Samuella Bruin Entered By: Samuella Bruin on 03/18/2022 10:59:54 -------------------------------------------------------------------------------- HPI Details Patient Name: Date of Service: NA [name] 03/18/2022 10:45 A M Medical Record Number: [id number] Patient Account Number: [id number] Date of Birth/Sex: Treating RN: 1956/05/03 (66 y.o. [name] Primary Care Provider: [name] Other Clinician: Referring Provider: Treating Provider/Extender: [name] in Treatment: 29 History of Present Illness HPI Description: ADMISSION 08/25/2021 This is a 66 year old man who initially presented to his primary care provider in September 2022 with pain in his left foot. He was sent for an x-ray and while the x-ray was being performed, the tech pointed out a wound on his foot that the patient was not aware existed. He does have type 2 diabetes with significant neuropathy. His diabetes is suboptimally controlled with his most recent A1c being 8.5. He also has a history of coronary artery disease status post three- vessel CABG. he was initially seen by orthopedics, but they referred him to [facility]. He has undergone at least 7 operations/debridements and several applications of skin substitute under the care of podiatry. He has been in a wound VAC for much of this time. His most recent procedure  was July 28, 2021. A portion of the talus was biopsied and was found to be consistent with osteomyelitis. Culture also returned positive for corynebacterium. He was seen on August 16, 2021 by infectious disease. A PICC line has been placed and he will be receiving a 6-week course of IV daptomycin and cefepime. In October 2022, he  underwent lower extremity vascular studies. Results are copied here: Right: Resting right ankle-brachial index is within normal range. No evidence of significant right lower extremity arterial disease. The right toe-brachial index is abnormal. Left: Resting left ankle-brachial index indicates mild left lower extremity arterial disease. The left toe-brachial index is abnormal. He has not been seen by vascular surgery despite these findings. He presented to clinic today in a cam boot and is using a knee scooter to offload. Wound VAC was in place. Once this was removed, a large ulcer was identified on the left midfoot/ankle. Bone is frankly exposed. There is no malodorous or purulent drainage. There is some granulation tissue over the central portion of the exposed bone. There is a tunnel that extends posteriorly for roughly 10 cm. It has been discussed with him by multiple providers that he is at very high risk of losing his lower leg because of this wound. He is extremely eager to avoid this outcome and is here today to review his options as well as receive ongoing wound care. 09/03/2021: Here for reevaluation of his wound. There does not appear to have been any substantial improvement overall since our last visit. He has been in a wound VAC with white foam overlying the exposed bone. We are working on getting him approved for hyperbaric oxygen therapy. 09/10/2021: We are in the process of getting him cleared to begin hyperbaric oxygen therapy. He still needs to obtain a chest x-ray. Although the wound measurements are roughly the same, I think the overall appearance of the  wound is better. The exposed bone has a bit more granulation tissue covering it. He has not received a vascular surgery appointment to reevaluate his flow to the wound. 09/17/2021: He has been approved for hyperbaric oxygen therapy and completed his chest x-ray, which I reviewed and it appears normal. The tunnels at the 12 and 10:00 positions are smaller. There is more granulation tissue covering the exposed bone and the undermining has decreased. He still has not received a vascular surgery appointment. 09/24/2021: He initiated hyperbaric oxygen therapy this week and is tolerating it well. He has an appointment with vascular surgery coming up on May 16. The granulation tissue is covering more of the exposed bone and both tunnels are a bit smaller. 10/01/2021: He continues to tolerate hyperbaric oxygen therapy. He saw infectious disease and they are planning to pull his PICC line. He has been initiated on oral antibiotics (doxycycline and Augmentin). The wound looks about the same but the tunnels are a little bit smaller. The skin seems to be contracting somewhat around the exposed bone. 10/08/2021: The wound is still about the same size, but the tunnels continue to come in and the skin is contracting around the exposed bone. He continues to have some accumulation of necrotic material in the inferoposterior aspect of the wound as well as accumulation at the 12:00 tunnel area. 10/15/2021: The wound is smaller today. The tunnels continue to come in. There is less necrotic tissue present. He does have some periwound maceration. 10/22/2021: The wound is about the same size. There is a little bit less undermining at the distal portion. The exposed bone is dark and I am not sure if this is staining from silver nitrate or his VAC sponge or if it represents necrosis. The tunnels are shallower but he does have some serous drainage coming from the 10:00 tunnel. He continues to tolerate hyperbaric oxygen therapy  well. 10/29/2021: The undermining continues to improve. The tunnels are  about the same. He has good granulation tissue overlying the majority of the exposed bone. It does appear that perhaps the tubing from his wound VAC has been eroding the skin at the 12 clock position. He continues to accumulate senescent epithelium around the borders of the wound. 11/05/2021: The undermining is almost completely resolved. The tunnels have contracted fairly significantly. No significant slough or debris accumulation. There is still senescent epithelium accumulation around the borders of the wound. He has been tolerating hyperbaric oxygen therapy well. 11/12/2021: Despite the measurements of the wound being about the same, the wound has changed in its shape and overall, I think it is improved. The undermining has resolved and the tunnels continue to shorten. There is good granulation tissue encroaching over the small area of bone that has remained exposed at the 12 o'clock position. Minimal slough accumulation. He continues to tolerate hyperbaric oxygen therapy well. 11/19/2021: I took a PCR culture last week. There was overgrowth of yeast. He is already taking suppressive doxycycline and Augmentin. I added fluconazole to his regimen. The wound is smaller again today. The tunnels continue to shorten. He continues to do well with hyperbaric oxygen therapy. 11/26/2021: For some reason, his foot has become macerated. The wound is narrower but about the same dimensions in its longitudinal aspect. The tunnels continue to shorten. He has some slough buildup on the wound as well as some heaped up senescent epithelium around the perimeter. 12/03/2021: No further maceration of his foot has occurred. The wound has contracted quite significantly from last week. The tunnel at 10:00 is closed. The tunnel at 12:00 is down to just a couple of millimeters. No other undermining is present. There is soft tissue coverage of the previously  exposed bone. There is just a bit of slough and biofilm on the wound surface. 12/10/2021: The wound is looking good. It turns out the tunnel at 12:00 is only exposed when the patient dorsiflexes his foot. It is about 2 cm in depth when he does this; when his foot is in plantarflexion, the tunnel is closed. The bone that was visible at the 12:00 tunnel is completely covered with granulation tissue, but there does feel like some exposed bone deeper into the tunnel area. There is senescent skin heaped up around the periphery. Minimal slough on the wound surface. 12/16/2021: The wound dimensions are roughly the same. The surface has nice granulation tissue. The exposed bone at the 12:00 tunnel continues to be covered with more soft tissue. 7/14; patient's wound measures smaller today. Using the wound VAC with underlying collagen. He is also being treated with HBO for underlying osteomyelitis. He tells me he is on doxycycline and ampicillin follows with infectious disease next week 12/31/2021: The wound continues to contract. Unfortunately, the area where the track pad and tubing have been rubbing continues to look like it is applying friction. He says that the home health nurses that have been applying the [facility] have been putting gauze underneath the tubing, but nonetheless there is ongoing tissue breakdown at this site. Light slough accumulation on the wound surface. The tunnel continues to contract. He is tolerating HBO without difficulty. 01/07/2022: Bridging the wound VAC away from the ankle has resulted in significant improvement in the tissue at the apex of the wound. The tunnel is still present and is not all that much shorter, but the overall wound surface is very robust and healthy looking. Minimal slough accumulation. No concern for acute infection. 01/21/2022: The wound continues to contract and  has a robust granulation tissue surface. The tunnel has come in considerably and is down to about 1.4  cm. There is still bone exposed within the tunnel but the rest of it is well covered. There is some senescent epithelium at the wound margins and a little bit of slough on the surface. 01/28/2022: No significant change in the wound this week, but there has not been any reaccumulation of senescent epithelium or slough. The tunnel is perhaps a millimeter less in depth. He has been approved for Apligraf and we will apply this today. 02/04/2022: The wound has contracted somewhat and the tunnel has filled in completely. The wound surface is clean. He is here for Apligraf #2. 02/11/2022: The wound has contracted further and is now nearly flush with the surrounding skin surface. Light layer of slough. Apligraf #3 plan for today. 02/18/2022: There is a band of epithelium trying to cut across the superior portion of the wound. There is robust granulation tissue with just a light layer of slough and biofilm on the surface. There was a little bit of greenish drainage in the wound VAC but none appreciated on the site itself. He is here for Apligraf #4. 03/04/2022: The wound has contracted considerably. There is good granulation tissue on the surface. Minimal biofilm. He is here for Apligraf #5. 03/18/2022: The wound has epithelialized to the point that it has been divided into 2 areas. Both areas are smaller in total than at his previous visit. There is good granulation tissue present with just a little bit of periwound eschar and surface slough. Electronic Signature(s) Signed: 03/18/2022 11:16:02 AM By: [name] MD FACS Entered By: [name] on 03/18/2022 11:16:02 -------------------------------------------------------------------------------- Physical Exam Details Patient Name: Date of Service: NA [name] 03/18/2022 10:45 A M Medical Record Number: [id number] Patient Account Number: [id number] Date of Birth/Sex: Treating RN: [birth year] (66 y.o. [name] Primary Care Provider:  [name] Other Clinician: Referring Provider: Treating Provider/Extender: [name]: 29 Constitutional Hypertensive, asymptomatic. . . . No acute distress.Marland Kitchen Respiratory Normal work of breathing on room air.. Notes 03/18/2022: The wound has epithelialized to the point that it has been divided into 2 areas. Both areas are smaller in total than at his previous visit. There is good granulation tissue present with just a little bit of periwound eschar and surface slough. Electronic Signature(s) Signed: 03/18/2022 11:16:28 AM By: [name] MD FACS Entered By: [name] on 03/18/2022 11:16:28 -------------------------------------------------------------------------------- Physician Orders Details Patient Name: Date of Service: NA [name] 03/18/2022 10:45 A M Medical Record Number: [id number] Patient Account Number: [id number] Date of Birth/Sex: Treating RN: 1956/04/08 (66 y.o. 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 - Dr. Lady Gary - room 2 Return A Anesthetic Wound #1 Left,Medial Foot (In clinic) Topical Lidocaine 4% applied to wound bed Bathing/ Shower/ Hygiene May shower with protection but do not get wound dressing(s) wet. Negative Presssure Wound Therapy Discontinue wound vac Edema Control - Lymphedema / SCD / Other Elevate legs  to the level  of the heart or above for 30 minutes daily and/or when sitting, a frequency of: Avoid standing for long periods of time. Wound Treatment Wound #1 - Foot Wound Laterality: Left, Medial Cleanser: Soap and Water 1 x Per Week/30 Days Discharge Instructions: May shower and wash wound with dial antibacterial soap and water prior to dressing change. Cleanser: Wound Cleanser 1 x Per Week/30 Days Discharge Instructions: Cleanse the wound with wound cleanser prior to applying a clean dressing using gauze sponges, not tissue or cotton balls. Peri-Wound Care: Sween Lotion (Moisturizing lotion) 1 x Per Week/30 Days Discharge Instructions: Apply moisturizing lotion as directed Prim Dressing: KerraCel Ag Gelling Fiber Dressing, 2x2 in (silver alginate) 1 x Per Week/30 Days ary Discharge Instructions: Apply silver alginate to wound bed as instructed Secondary Dressing: ABD Pad, 5x9 1 x Per Week/30 Days Discharge Instructions: Apply over primary dressing as directed. Secondary Dressing: Woven Gauze Sponge, Non-Sterile 4x4 in 1 x Per Week/30 Days Discharge Instructions: Apply over primary dressing as directed. Secured With: Transpore Surgical Tape, 2x10 (in/yd) 1 x Per Week/30 Days Discharge Instructions: Secure dressing with tape as directed. Compression Wrap: Kerlix Roll 4.5x3.1 (in/yd) 1 x Per Week/30 Days Discharge Instructions: Apply Kerlix and Coban compression as directed. Compression Wrap: Coban Self-Adherent Wrap 4x5 (in/yd) 1 x Per Week/30 Days Discharge Instructions: Apply over Kerlix as directed. Patient Medications llergies: codeine A Notifications Medication Indication Start End 03/18/2022 lidocaine DOSE topical 4 % cream - cream topical Electronic Signature(s) Signed: 03/18/2022 12:30:23 PM By: [name] MD FACS Entered By: [name] on 03/18/2022 11:16:42 -------------------------------------------------------------------------------- Problem List Details Patient  Name: Date of Service: NA [name] 03/18/2022 10:45 A M Medical Record Number: [id number] Patient Account Number: [id number] Date of Birth/Sex: Treating RN: [birth year] (66 y.o. 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 03/18/2022 10:45 A M Medical Record Number: [id number] Patient Account Number: [id number] Date of Birth/Sex: Treating RN: 09/24/1955 (66 y.o. [name] Primary Care Provider: [name] Other Clinician: Referring Provider: Treating Provider/Extender: [name] in Treatment: 29 Subjective Chief Complaint Information obtained from Patient Patients presents for treatment of an open diabetic ulcer with exposed bone and osteomyelitis History of Present Illness (HPI) ADMISSION 08/25/2021 This is a 66 year old man who initially presented to his primary care provider in September 2022 with pain in his left foot. He was sent for an x-ray and while the x-ray was being performed, the tech  pointed out a wound on his foot that the patient was not aware existed. He does have type 2 diabetes with significant neuropathy. His diabetes is suboptimally controlled with his most recent A1c being 8.5. He also has a history of coronary artery disease status post three- vessel CABG. he was initially  seen by orthopedics, but they referred him to [facility]. He has undergone at least 7 operations/debridements and several applications of skin substitute under the care of podiatry. He has been in a wound VAC for much of this time. His most recent procedure was July 28, 2021. A portion of the talus was biopsied and was found to be consistent with osteomyelitis. Culture also returned positive for corynebacterium. He was seen on August 16, 2021 by infectious disease. A PICC line has been placed and he will be receiving a 6-week course of IV daptomycin and cefepime. In October 2022, he underwent lower extremity vascular studies. Results are copied here: Right: Resting right ankle-brachial index is within normal range. No evidence of significant right lower extremity arterial disease. The right toe-brachial index is abnormal. Left: Resting left ankle-brachial index indicates mild left lower extremity arterial disease. The left toe-brachial index is abnormal. He has not been seen by vascular surgery despite these findings. He presented to clinic today in a cam boot and is using a knee scooter to offload. Wound VAC was in place. Once this was removed, a large ulcer was identified on the left midfoot/ankle. Bone is frankly exposed. There is no malodorous or purulent drainage. There is some granulation tissue over the central portion of the exposed bone. There is a tunnel that extends posteriorly for roughly 10 cm. It has been discussed with him by multiple providers that he is at very high risk of losing his lower leg because of this wound. He is extremely eager to avoid this outcome and  is here today to review his options as well as receive ongoing wound care. 09/03/2021: Here for reevaluation of his wound. There does not appear to have been any substantial improvement overall since our last visit. He has been in a wound VAC with white foam overlying the exposed bone. We are working on getting him approved for hyperbaric oxygen therapy. 09/10/2021: We are in the process of getting him cleared to begin hyperbaric oxygen therapy. He still needs to obtain a chest x-ray. Although the wound measurements are roughly the same, I think the overall appearance of the wound is better. The exposed bone has a bit more granulation tissue covering it. He has not received a vascular surgery appointment to reevaluate his flow to the wound. 09/17/2021: He has been approved for hyperbaric oxygen therapy and completed his chest x-ray, which I reviewed and it appears normal. The tunnels at the 12 and 10:00 positions are smaller. There is more granulation tissue covering the exposed bone and the undermining has decreased. He still has not received a vascular surgery appointment. 09/24/2021: He initiated hyperbaric oxygen therapy this week and is tolerating it well. He has an appointment with vascular surgery coming up on May 16. The granulation tissue is covering more of the exposed bone and both tunnels are a bit smaller. 10/01/2021: He continues to tolerate hyperbaric oxygen therapy. He saw infectious disease and they are planning to pull his PICC line. He has been initiated on oral antibiotics (doxycycline and Augmentin). The wound looks about the same but the tunnels are a little bit smaller. The skin seems to be contracting somewhat around the exposed bone. 10/08/2021: The wound is still about the same size, but the tunnels continue to come in and the skin is contracting around the exposed bone. He continues to have some accumulation of necrotic material in the inferoposterior aspect of the wound as well as  accumulation at the  12:00 tunnel area. 10/15/2021: The wound is smaller today. The tunnels continue to come in. There is less necrotic tissue present. He does have some periwound maceration. 10/22/2021: The wound is about the same size. There is a little bit less undermining at the distal portion. The exposed bone is dark and I am not sure if this is staining from silver nitrate or his VAC sponge or if it represents necrosis. The tunnels are shallower but he does have some serous drainage coming from the 10:00 tunnel. He continues to tolerate hyperbaric oxygen therapy well. 10/29/2021: The undermining continues to improve. The tunnels are about the same. He has good granulation tissue overlying the majority of the exposed bone. It does appear that perhaps the tubing from his wound VAC has been eroding the skin at the 12 clock position. He continues to accumulate senescent epithelium around the borders of the wound. 11/05/2021: The undermining is almost completely resolved. The tunnels have contracted fairly significantly. No significant slough or debris accumulation. There is still senescent epithelium accumulation around the borders of the wound. He has been tolerating hyperbaric oxygen therapy well. 11/12/2021: Despite the measurements of the wound being about the same, the wound has changed in its shape and overall, I think it is improved. The undermining has resolved and the tunnels continue to shorten. There is good granulation tissue encroaching over the small area of bone that has remained exposed at the 12 o'clock position. Minimal slough accumulation. He continues to tolerate hyperbaric oxygen therapy well. 11/19/2021: I took a PCR culture last week. There was overgrowth of yeast. He is already taking suppressive doxycycline and Augmentin. I added fluconazole to his regimen. The wound is smaller again today. The tunnels continue to shorten. He continues to do well with hyperbaric oxygen  therapy. 11/26/2021: For some reason, his foot has become macerated. The wound is narrower but about the same dimensions in its longitudinal aspect. The tunnels continue to shorten. He has some slough buildup on the wound as well as some heaped up senescent epithelium around the perimeter. 12/03/2021: No further maceration of his foot has occurred. The wound has contracted quite significantly from last week. The tunnel at 10:00 is closed. The tunnel at 12:00 is down to just a couple of millimeters. No other undermining is present. There is soft tissue coverage of the previously exposed bone. There is just a bit of slough and biofilm on the wound surface. 12/10/2021: The wound is looking good. It turns out the tunnel at 12:00 is only exposed when the patient dorsiflexes his foot. It is about 2 cm in depth when he does this; when his foot is in plantarflexion, the tunnel is closed. The bone that was visible at the 12:00 tunnel is completely covered with granulation tissue, but there does feel like some exposed bone deeper into the tunnel area. There is senescent skin heaped up around the periphery. Minimal slough on the wound surface. 12/16/2021: The wound dimensions are roughly the same. The surface has nice granulation tissue. The exposed bone at the 12:00 tunnel continues to be covered with more soft tissue. 7/14; patient's wound measures smaller today. Using the wound VAC with underlying collagen. He is also being treated with HBO for underlying osteomyelitis. He tells me he is on doxycycline and ampicillin follows with infectious disease next week 12/31/2021: The wound continues to contract. Unfortunately, the area where the track pad and tubing have been rubbing continues to look like it is applying friction. He says that the  home health nurses that have been applying the [facility] have been putting gauze underneath the tubing, but nonetheless there is ongoing tissue breakdown at this site. Light slough  accumulation on the wound surface. The tunnel continues to contract. He is tolerating HBO without difficulty. 01/07/2022: Bridging the wound VAC away from the ankle has resulted in significant improvement in the tissue at the apex of the wound. The tunnel is still present and is not all that much shorter, but the overall wound surface is very robust and healthy looking. Minimal slough accumulation. No concern for acute infection. 01/21/2022: The wound continues to contract and has a robust granulation tissue surface. The tunnel has come in considerably and is down to about 1.4 cm. There is still bone exposed within the tunnel but the rest of it is well covered. There is some senescent epithelium at the wound margins and a little bit of slough on the surface. 01/28/2022: No significant change in the wound this week, but there has not been any reaccumulation of senescent epithelium or slough. The tunnel is perhaps a millimeter less in depth. He has been approved for Apligraf and we will apply this today. 02/04/2022: The wound has contracted somewhat and the tunnel has filled in completely. The wound surface is clean. He is here for Apligraf #2. 02/11/2022: The wound has contracted further and is now nearly flush with the surrounding skin surface. Light layer of slough. Apligraf #3 plan for today. 02/18/2022: There is a band of epithelium trying to cut across the superior portion of the wound. There is robust granulation tissue with just a light layer of slough and biofilm on the surface. There was a little bit of greenish drainage in the wound VAC but none appreciated on the site itself. He is here for Apligraf #4. 03/04/2022: The wound has contracted considerably. There is good granulation tissue on the surface. Minimal biofilm. He is here for Apligraf #5. 03/18/2022: The wound has epithelialized to the point that it has been divided into 2 areas. Both areas are smaller in total than at his previous visit. There  is good granulation tissue present with just a little bit of periwound eschar and surface slough. Patient History Information obtained from Patient. Family History Cancer - Father, Diabetes - Father,Mother,Paternal Grandparents, Heart Disease - Father, Hypertension - Father, No family history of Hereditary Spherocytosis, Kidney Disease, Lung Disease, Seizures, Stroke, Thyroid Problems, Tuberculosis. Social History Never smoker, Marital Status - Married, Alcohol Use - Rarely, Drug Use - No History, Caffeine Use - Daily. Medical History Eyes Patient has history of Cataracts - Removed 2008 Cardiovascular Patient has history of Coronary Artery Disease, Hypertension, Myocardial Infarction, Peripheral Arterial Disease Endocrine Patient has history of Type II Diabetes Musculoskeletal Patient has history of Osteomyelitis Neurologic Patient has history of Neuropathy Medical A Surgical History Notes nd Cardiovascular Hypercholesterolemia Abnormal EKG CABG X3 2019 Gastrointestinal GERD Musculoskeletal Diabetic foot ulcer Objective Constitutional Hypertensive, asymptomatic. No acute distress.. Vitals Time Taken: 10:38 AM, Height: 74 in, Weight: 186 lbs, BMI: 23.9, Temperature: 97.2 F, Pulse: 72 bpm, Respiratory Rate: 18 breaths/min, Blood Pressure: 176/84 mmHg, Capillary Blood Glucose: 197 mg/dl. Respiratory Normal work of breathing on room air.. General Notes: 03/18/2022: The wound has epithelialized to the point that it has been divided into 2 areas. Both areas are smaller in total than at his previous visit. There is good granulation tissue present with just a little bit of periwound eschar and surface slough. Integumentary (Hair, Skin) Wound #1 status is Open. Original  cause of wound was Gradually Appeared. The date acquired was: 02/22/2021. The wound has been in treatment 29 weeks. The wound is located on the Left,Medial Foot. The wound measures 4.3cm length x 1.5cm width x 0.1cm  depth; 5.066cm^2 area and 0.507cm^3 volume. There is Fat Layer (Subcutaneous Tissue) exposed. There is no tunneling or undermining noted. There is a medium amount of serosanguineous drainage noted. The wound margin is distinct with the outline attached to the wound base. There is large (67-100%) red, hyper - granulation within the wound bed. There is a small (1- 33%) amount of necrotic tissue within the wound bed including Adherent Slough. The periwound skin appearance had no abnormalities noted for texture. The periwound skin appearance had no abnormalities noted for color. The periwound skin appearance exhibited: Dry/Scaly, Maceration. Periwound temperature was noted as No Abnormality. 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 There was a Selective/Open Wound Non-Viable Tissue Debridement with a total area of 6.45 sq cm performed by [name], MD. With the following instrument(s): Curette to remove Non-Viable tissue/material. Material removed includes Eschar and Slough and after achieving pain control using Lidocaine 4% T opical Solution. No specimens were taken. A time out was conducted at 10:59, prior to the start of the procedure. A Minimum amount of bleeding was controlled with Pressure. The procedure was tolerated well with a pain level of 0 throughout and a pain level of 0 following the procedure. Post Debridement Measurements: 4.3cm length x 1.5cm width x 0.1cm depth; 0.507cm^3 volume. Character of  Wound/Ulcer Post Debridement is improved. Severity of Tissue Post Debridement is: Fat layer exposed. Post procedure Diagnosis Wound #1: Same as Pre-Procedure General Notes: scribed for Dr. Lady Gary by Samuella Bruin, RN. Plan Follow-up Appointments: Return Appointment in 1 week. - Dr. Lady Gary - room 2 Anesthetic: Wound #1 Left,Medial Foot: (In clinic) Topical Lidocaine 4% applied to wound bed Bathing/ Shower/ Hygiene: May shower with protection but do not get wound dressing(s) wet. Negative Presssure Wound Therapy: Discontinue wound vac Edema Control - Lymphedema / SCD / Other: Elevate legs to the level of the heart or above for 30 minutes daily and/or when sitting, a frequency of: Avoid standing for long periods of time. The following medication(s) was prescribed: lidocaine topical 4 % cream cream topical was prescribed at facility WOUND #1: - Foot Wound Laterality: Left, Medial Cleanser: Soap and Water 1 x Per Week/30 Days Discharge Instructions: May shower and wash wound with dial antibacterial soap and water prior to dressing change. Cleanser: Wound Cleanser 1 x Per Week/30 Days Discharge Instructions: Cleanse the wound with wound cleanser prior to applying a clean dressing using gauze sponges, not tissue or cotton balls. Peri-Wound Care: Sween Lotion (Moisturizing lotion) 1 x Per Week/30 Days Discharge Instructions: Apply moisturizing lotion as directed Prim Dressing: KerraCel Ag Gelling Fiber Dressing, 2x2 in (silver alginate) 1 x Per Week/30 Days ary Discharge Instructions: Apply silver alginate to wound bed as instructed Secondary Dressing: ABD Pad, 5x9 1 x Per Week/30 Days Discharge Instructions: Apply over primary dressing as directed. Secondary Dressing: Woven Gauze Sponge, Non-Sterile 4x4 in 1 x Per  Week/30 Days Discharge Instructions: Apply over primary dressing as directed. Secured With: Transpore Surgical T ape, 2x10 (in/yd) 1 x Per Week/30 Days Discharge Instructions:  Secure dressing with tape as directed. Com pression Wrap: Kerlix Roll 4.5x3.1 (in/yd) 1 x Per Week/30 Days Discharge Instructions: Apply Kerlix and Coban compression as directed. Com pression Wrap: Coban Self-Adherent Wrap 4x5 (in/yd) 1 x Per Week/30 Days Discharge Instructions: Apply over Kerlix as directed. 03/18/2022: The wound has epithelialized to the point that it has been divided into 2 areas. Both areas are smaller in total than at his previous visit. There is good granulation tissue present with just a little bit of periwound eschar and surface slough. I used a curette to debride slough and eschar from his wound. He has completed his Apligraf treatments. We will use silver alginate with Kerlix and Coban wrap. He will continue his hyperbaric oxygen treatments, as he has demonstrated truly remarkable improvement in his wound; he has 5 treatments remaining. Follow-up with me in 1 week. Electronic Signature(s) Signed: 03/18/2022 11:17:37 AM By: [name] MD FACS Entered By: [name] on 03/18/2022 11:17:37 -------------------------------------------------------------------------------- HxROS Details Patient Name: Date of Service: NA [name], [name] 03/18/2022 10:45 A M Medical Record Number: [id number] Patient Account Number: [id number] Date of Birth/Sex: Treating RN: 12/14/1955 (66 y.o. [name] Primary Care Provider: [name] Other Clinician: Referring Provider: Treating Provider/Extender: [name]: 29 Information Obtained From Patient Eyes Medical History: Positive for: Cataracts - Removed 2008 Cardiovascular Medical History: Positive for: Coronary Artery Disease; Hypertension; Myocardial Infarction; Peripheral Arterial Disease Past Medical History Notes: Hypercholesterolemia Abnormal EKG CABG X3 2019 Gastrointestinal Medical History: Past Medical History Notes: GERD Endocrine Medical History: Positive  for: Type II Diabetes Time with diabetes: 24 years Treated with: Insulin, Oral agents Blood sugar tested every day: Yes Tested : Musculoskeletal Medical History: Positive for: Osteomyelitis Past Medical History Notes: Diabetic foot ulcer Neurologic Medical History: Positive for: Neuropathy HBO Extended History Items Eyes: Cataracts Immunizations Pneumococcal Vaccine: Received Pneumococcal Vaccination: Yes Received Pneumococcal Vaccination On or After 60th Birthday: Yes Implantable Devices Yes Family and Social History Cancer: Yes - Father; Diabetes: Yes - Father,Mother,Paternal Grandparents; Heart Disease: Yes - Father; Hereditary Spherocytosis: No; Hypertension: Yes - Father; Kidney Disease: No; Lung Disease: No; Seizures: No; Stroke: No; Thyroid Problems: No; Tuberculosis: No; Never smoker; Marital Status - Married; Alcohol Use: Rarely; Drug Use: No History; Caffeine Use: Daily; Financial Concerns: No; Food, Clothing or Shelter Needs: No; Support System Lacking: No; Transportation Concerns: No Electronic Signature(s) Signed: 03/18/2022 12:30:23 PM By: [name] MD FACS Signed: 03/18/2022 4:25:48 PM By: Samuella Bruin Entered By: [name] on 03/18/2022 11:16:07 -------------------------------------------------------------------------------- SuperBill Details Patient Name: Date of Service: NA [name] 03/18/2022 Medical Record Number: [id number] Patient Account Number: [id number] Date of Birth/Sex: Treating RN: 06-04-1956 (66 y.o. 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 M86.672 Other chronic osteomyelitis, left ankle and foot E11.610 Type 2 diabetes mellitus with diabetic neuropathic  arthropathy E11.621 Type 2 diabetes mellitus with foot ulcer I25.10 Atherosclerotic heart disease of native coronary artery without angina pectoris Facility Procedures CPT4 Code: [id number] Description: [phone] - DEBRIDE WOUND 1ST 20 SQ CM OR < ICD-10 Diagnosis Description L97.324 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 Modifier: Quantity: 1  Physician Procedures : CPT4 Code Description Modifier [id number] - WC PHYS LEVEL 3 - EST PT 25 ICD-10 Diagnosis Description L97.324 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

## 2022-03-18 NOTE — Progress Notes (Signed)
KASAI, BELTRAN ([id number]) Visit Report for 03/18/2022 Arrival Information Details Patient Name: Date of Service: NA [name] 03/18/2022 10:45 A M Medical Record Number: [id number] Patient Account Number: [id number] Date of Birth/Sex: Treating RN: 1955-09-08 (66 y.o. [name] Primary Care Teshia Mahone: [name] Other Clinician: Referring Ladarrious Kirksey: Treating Tynell Winchell/Extender: [name] in [name]: 29 Visit Information History Since Last Visit Added or deleted any medications: No Patient Arrived: Knee Scooter Any new allergies or adverse reactions: No Arrival Time: 10:36 Had a fall or experienced change in No Accompanied By: self activities of daily living that may affect Transfer Assistance: None risk of falls: Patient Identification Verified: Yes Signs or symptoms of abuse/neglect since last visito No Secondary Verification Process Completed: Yes Hospitalized since last visit: No Patient Requires Transmission-Based Precautions: No Implantable device outside of the clinic excluding No Patient Has Alerts: Yes cellular tissue based products placed in the center Patient Alerts: Patient on Blood Thinner since last visit: Has Dressing in Place as Prescribed: Yes Pain Present Now: No Electronic Signature(s) Signed: 03/18/2022 4:25:48 PM By: Samuella Bruin Entered By: Samuella Bruin on 03/18/2022 10:42:30 -------------------------------------------------------------------------------- Lower Extremity Assessment Details Patient Name: Date of Service: [name] A. 03/18/2022 10:45 A M Medical Record Number: [id number] Patient Account Number: [id number] Date of Birth/Sex: Treating RN: [birth year] (66 y.o. [name] Primary Care Jaemarie Hochberg: [name] Other Clinician: Referring Gracey Tolle: Treating Enis Leatherwood/Extender: [name] Weeks in [name]: 29 Edema Assessment Assessed: [name]: No] [Right:  No] Edema: [Left: Ye] [Right: s] Calf Left: Right: Point of Measurement: From Medial Instep 31.5 cm Ankle Left: Right: Point of Measurement: From Medial Instep 22.5 cm Vascular Assessment Pulses: Dorsalis Pedis Palpable: [Left:Yes] Electronic Signature(s) Signed: 03/18/2022 4:25:48 PM By: Samuella Bruin Entered By: Samuella Bruin on 03/18/2022 10:48:01 -------------------------------------------------------------------------------- Multi Wound Chart Details Patient Name: Date of Service: NA Colin Nephew A. 03/18/2022 10:45 A M Medical Record Number: [id number] Patient Account Number: [id number] Date of Birth/Sex: Treating RN: August 14, 1955 (66 y.o. [name] Primary Care Yovani Cogburn: [name] Other Clinician: Referring Adaleen Hulgan: Treating Martinez Boxx/Extender: [name] Weeks in [name]: 29 Vital Signs Height(in): 74 Capillary Blood Glucose(mg/dl): 176 Weight(lbs): 160 Pulse(bpm): 72 Body Mass Index(BMI): 23.9 Blood Pressure(mmHg): 176/84 Temperature(F): 97.2 Respiratory Rate(breaths/min): 18 Photos: [N/A:N/A] Left, Medial Foot N/A N/A Wound Location: Gradually Appeared N/A N/A Wounding Event: Diabetic Wound/Ulcer of the Lower N/A N/A Primary Etiology: Extremity Cataracts, Coronary Artery Disease, N/A N/A Comorbid History: Hypertension, Myocardial Infarction, Peripheral Arterial Disease, Type II Diabetes, Osteomyelitis, Neuropathy 02/22/2021 N/A N/A Date Acquired: 56 N/A N/A Weeks of [name]: Open N/A N/A Wound Status: No N/A N/A Wound Recurrence: 4.3x1.5x0.1 N/A N/A Measurements L x W x D (cm) 5.066 N/A N/A A (cm) : rea 0.507 N/A N/A Volume (cm) : 64.20% N/A N/A % Reduction in A rea: 96.40% N/A N/A % Reduction in Volume: Grade 3 N/A N/A Classification: Medium N/A N/A Exudate A mount: Serosanguineous N/A N/A Exudate Type: red, brown N/A N/A Exudate Color: Distinct, outline attached N/A N/A Wound  Margin: Large (67-100%) N/A N/A Granulation A mount: Red, Hyper-granulation N/A N/A Granulation Quality: Small (1-33%) N/A N/A Necrotic A mount: Fat Layer (Subcutaneous Tissue): Yes N/A N/A Exposed Structures: Fascia: No Tendon: No Muscle: No Joint: No Bone: No Small (1-33%) N/A N/A Epithelialization: Debridement - Selective/Open Wound N/A N/A Debridement: Pre-procedure Verification/Time Out 10:59 N/A N/A Taken: Lidocaine 4% Topical Solution N/A N/A Pain Control: Necrotic/Eschar, Slough N/A N/A Tissue  Debrided: Non-Viable Tissue N/A N/A Level: 6.45 N/A N/A Debridement A (sq cm): rea Curette N/A N/A Instrument: Minimum N/A N/A Bleeding: Pressure N/A N/A Hemostasis Achieved: 0 N/A N/A Procedural Pain: 0 N/A N/A Post Procedural Pain: Procedure was tolerated well N/A N/A Debridement [name] Response: 4.3x1.5x0.1 N/A N/A Post Debridement Measurements L x W x D (cm) 0.507 N/A N/A Post Debridement Volume: (cm) Excoriation: No N/A N/A Periwound Skin Texture: Induration: No Callus: No Crepitus: No Rash: No Scarring: No Maceration: Yes N/A N/A Periwound Skin Moisture: Dry/Scaly: Yes No Abnormalities Noted N/A N/A Periwound Skin Color: No Abnormality N/A N/A Temperature: Debridement N/A N/A Procedures Performed: [name] Notes Electronic Signature(s) Signed: 03/18/2022 11:14:50 AM By: [name] MD FACS Signed: 03/18/2022 4:25:48 PM By: Samuella Bruin Entered By: [name] on 03/18/2022 11:14:50 -------------------------------------------------------------------------------- Multi-Disciplinary Care Plan Details Patient Name: Date of Service: [name] A. 03/18/2022 10:45 A M Medical Record Number: [id number] Patient Account Number: [id number] Date of Birth/Sex: Treating RN: 25-Nov-1955 (66 y.o. [name] Primary Care Aiesha Leland: [name] Other Clinician: Referring Issak Goley: Treating Tacy Chavis/Extender: [name] in [name]: 29 Multidisciplinary Care Plan reviewed with physician Active Inactive HBO Nursing Diagnoses: Anxiety related to feelings of confinement associated with the hyperbaric oxygen chamber Anxiety related to knowledge deficit of hyperbaric oxygen therapy and [name] procedures Discomfort related to temperature and humidity changes inside hyperbaric chamber Potential for barotraumas to ears, sinuses, teeth, and lungs or cerebral gas embolism related to changes in atmospheric pressure inside hyperbaric oxygen chamber Potential for oxygen toxicity seizures related to delivery of 100% oxygen at an increased atmospheric pressure Potential for pulmonary oxygen toxicity related to delivery of 100% oxygen at an increased atmospheric pressure Goals: Barotrauma will be prevented during HBO2 Date Initiated: 09/17/2021 T arget Resolution Date: 04/08/2022 Goal Status: Active Patient will tolerate the hyperbaric oxygen therapy [name] Date Initiated: 09/17/2021 T arget Resolution Date: 04/08/2022 Goal Status: Active Patient will tolerate the internal climate of the chamber Date Initiated: 09/17/2021 T arget Resolution Date: 04/08/2022 Goal Status: Active Patient/caregiver will verbalize understanding of HBO goals, rationale, procedures and potential hazards Date Initiated: 09/17/2021 T arget Resolution Date: 04/08/2022 Goal Status: Active Signs and symptoms of pulmonary oxygen toxicity will be recognized and promptly addressed Date Initiated: 09/17/2021 T arget Resolution Date: 04/08/2022 Goal Status: Active Signs and symptoms of seizure will be recognized and promptly addressed ; seizing patients will suffer no harm Date Initiated: 09/17/2021 Target Resolution Date: 04/08/2022 Goal Status: Active Interventions: Administer decongestants, per physician orders, prior to HBO2 Administer the correct therapeutic gas delivery based on the patients needs and limitations,  per physician order Assess and provide for patients comfort related to the hyperbaric environment and equalization of middle ear Assess for signs and symptoms related to adverse events, including but not limited to confinement anxiety, pneumothorax, oxygen toxicity and baurotrauma Assess patient for any history of confinement anxiety Assess patient's knowledge and expectations regarding hyperbaric medicine and provide education related to the hyperbaric environment, goals of [name] and prevention of adverse events Implement protocols to decrease risk of pneumothorax in high risk patients Notes: Nutrition Nursing Diagnoses: Impaired glucose control: actual or potential Goals: Patient/caregiver verbalizes understanding of need to maintain therapeutic glucose control per primary care physician Date Initiated: 08/25/2021 Target Resolution Date: 04/08/2022 Goal Status: Active Interventions: Assess HgA1c results as ordered upon admission and as needed Provide education on elevated blood sugars and impact on wound healing Notes: Osteomyelitis Nursing Diagnoses: Infection: osteomyelitis Knowledge deficit related  to disease process and management Goals: Patient's osteomyelitis will resolve Date Initiated: 09/17/2021 Target Resolution Date: 04/08/2022 Goal Status: Active Interventions: Assess for signs and symptoms of osteomyelitis resolution every visit Provide education on osteomyelitis [name] Activities: Surgical debridement : 09/17/2021 Systemic antibiotics : 09/17/2021 T ordered outside of clinic : 09/17/2021 est Notes: Electronic Signature(s) Signed: 03/18/2022 4:25:48 PM By: Adline Peals Entered By: Adline Peals on 03/18/2022 10:58:47 -------------------------------------------------------------------------------- Pain Assessment Details Patient Name: Date of Service: [name] A. 03/18/2022 10:45 A M Medical Record Number: [id number] Patient Account Number:  [id number] Date of Birth/Sex: Treating RN: December 01, 1955 (66 y.o. [name] Primary Care Carlisle Enke: [name] Other Clinician: Referring Shashank Kwasnik: Treating Stellar Gensel/Extender: [name] Weeks in [name]: 29 Active Problems Location of Pain Severity and Description of Pain Patient Has Paino No Site Locations Rate the pain. Current Pain Level: 0 Pain Management and Medication Current Pain Management: Electronic Signature(s) Signed: 03/18/2022 4:25:48 PM By: Adline Peals Entered By: Adline Peals on 03/18/2022 10:42:57 -------------------------------------------------------------------------------- Wound Assessment Details Patient Name: Date of Service: NA [name] 03/18/2022 10:45 A M Medical Record Number: [id number] Patient Account Number: [id number] Date of Birth/Sex: Treating RN: Mar 15, 1956 (66 y.o. [name] Primary Care Caytlyn Evers: [name] Other Clinician: Referring Autry Prust: Treating Nyaisha Simao/Extender: [name] Weeks in [name]: 29 Wound Status Wound Number: 1 Primary Diabetic Wound/Ulcer of the Lower Extremity Etiology: Wound Location: Left, Medial Foot Wound Open Wounding Event: Gradually Appeared Status: Date Acquired: 02/22/2021 Comorbid Cataracts, Coronary Artery Disease, Hypertension, Myocardial Weeks Of [name]: 29 History: Infarction, Peripheral Arterial Disease, Type II Diabetes, Clustered Wound: No Osteomyelitis, Neuropathy Photos Wound Measurements Length: (cm) 4.3 Width: (cm) 1.5 Depth: (cm) 0.1 Area: (cm) 5.066 Volume: (cm) 0.507 % Reduction in Area: 64.2% % Reduction in Volume: 96.4% Epithelialization: Small (1-33%) Tunneling: No Undermining: No Wound Description Classification: Grade 3 Wound Margin: Distinct, outline attached Exudate Amount: Medium Exudate Type: Serosanguineous Exudate Color: red, brown Foul Odor After Cleansing: No Slough/Fibrino  Yes Wound Bed Granulation Amount: Large (67-100%) Exposed Structure Granulation Quality: Red, Hyper-granulation Fascia Exposed: No Necrotic Amount: Small (1-33%) Fat Layer (Subcutaneous Tissue) Exposed: Yes Necrotic Quality: Adherent Slough Tendon Exposed: No Muscle Exposed: No Joint Exposed: No Bone Exposed: No Periwound Skin Texture Texture Color No Abnormalities Noted: Yes No Abnormalities Noted: Yes Moisture Temperature / Pain No Abnormalities Noted: No Temperature: No Abnormality Dry / Scaly: Yes Maceration: Yes Electronic Signature(s) Signed: 03/18/2022 4:25:48 PM By: Adline Peals Entered By: Adline Peals on 03/18/2022 10:50:15 -------------------------------------------------------------------------------- Vitals Details Patient Name: Date of Service: NA [name] 03/18/2022 10:45 A M Medical Record Number: [id number] Patient Account Number: [id number] Date of Birth/Sex: Treating RN: 1956-02-25 (67 y.o. [name] Primary Care Daliyah Sramek: [name] Other Clinician: Referring Lucrezia Dehne: Treating Dajour Pierpoint/Extender: [name] Weeks in [name]: 29 Vital Signs Time Taken: 10:38 Temperature (F): 97.2 Height (in): 74 Pulse (bpm): 72 Weight (lbs): 186 Respiratory Rate (breaths/min): 18 Body Mass Index (BMI): 23.9 Blood Pressure (mmHg): 176/84 Capillary Blood Glucose (mg/dl): 197 Reference Range: 80 - 120 mg / dl Electronic Signature(s) Signed: 03/18/2022 4:25:48 PM By: Adline Peals Entered By: Adline Peals on 03/18/2022 10:42:52

## 2022-03-18 NOTE — Progress Notes (Signed)
[name], [name] ([id number]) Visit Report for 03/18/2022 Arrival Information Details Patient Name: Date of Service: NA [name] 03/18/2022 8:00 A M Medical Record Number: [id number] Patient Account Number: [id number] Date of Birth/Sex: Treating RN: [birth year] (66 y.o. [name], Tammi Klippel Primary Care Quindell Shere: [name] Other Clinician: [name] Referring Abrina Petz: Treating Mare Ludtke/Extender: [name] in Treatment: 29 Visit Information History Since Last Visit All ordered tests and consults were completed: Yes Patient Arrived: Knee Scooter Added or deleted any medications: No Arrival Time: 08:13 Any new allergies or adverse reactions: No Accompanied By: Wife Had a fall or experienced change in No Transfer Assistance: None activities of daily living that may affect Patient Identification Verified: Yes risk of falls: Secondary Verification Process Completed: Yes Signs or symptoms of abuse/neglect since last visito No Patient Requires Transmission-Based Precautions: No Hospitalized since last visit: No Patient Has Alerts: Yes Implantable device outside of the clinic excluding No Patient Alerts: Patient on Blood Thinner cellular tissue based products placed in the center since last visit: Pain Present Now: No Electronic Signature(s) Signed: 03/18/2022 1:43:57 PM By: [name] EMT Entered By: [name] on 03/18/2022 13:43:57 -------------------------------------------------------------------------------- Encounter Discharge Information Details Patient Name: Date of Service: [name] 03/18/2022 8:00 A M Medical Record Number: [id number] Patient Account Number: [id number] Date of Birth/Sex: Treating RN: 1956/06/07 (66 y.o. [name] Primary Care Vlasta Baskin: [name] Other Clinician: [name] Referring Tyjay Galindo: Treating Kinston Magnan/Extender: [name] Weeks in Treatment: 29 Encounter Discharge  Information Items Discharge Condition: Stable Ambulatory Status: Knee Scooter Discharge Destination: Home Transportation: Private Auto Accompanied By: Wife Schedule Follow-up Appointment: Yes Clinical Summary of Care: Electronic Signature(s) Signed: 03/18/2022 1:57:21 PM By: [name] EMT Entered By: [name] on 03/18/2022 13:57:21 -------------------------------------------------------------------------------- Vitals Details Patient Name: Date of Service: NA [name] 03/18/2022 8:00 A M Medical Record Number: [id number] Patient Account Number: [id number] Date of Birth/Sex: Treating RN: 11/27/1955 (66 y.o. [name], Tammi Klippel Primary Care Cassandra Harbold: [name] Other Clinician: [name] Referring Rechelle Niebla: Treating Lavonte Palos/Extender: [name] Weeks in Treatment: 29 Vital Signs Time Taken: 08:24 Temperature (F): 97.3 Height (in): 74 Pulse (bpm): 86 Weight (lbs): 186 Respiratory Rate (breaths/min): 16 Body Mass Index (BMI): 23.9 Blood Pressure (mmHg): 134/87 Capillary Blood Glucose (mg/dl): 196 Reference Range: 80 - 120 mg / dl Electronic Signature(s) Signed: 03/18/2022 1:45:26 PM By: [name] EMT Entered By: [name] on 03/18/2022 13:45:26

## 2022-03-18 NOTE — Progress Notes (Addendum)
Colin, [name] ([id number]) Visit Report for 03/18/2022 HBO Details Patient Name: Date of Service: NA [name] A. 03/18/2022 8:00 A M Medical Record Number: [id number] Patient Account Number: [id number] Date of Birth/Sex: Treating RN: Jan 04, 1956 (66 y.o. [name], [name] Primary Care Damin Salido: [name] Other Clinician: [name] Referring Keiden Deskin: Treating Lakevia Perris/Extender: [name] Weeks in Treatment: 54 HBO Treatment Course Details Treatment Course Number: 1 Ordering Taiesha Bovard: [name] T Treatments Ordered: otal 120 HBO Treatment Start Date: 09/20/2021 HBO Indication: Diabetic Ulcer(s) of the Lower Extremity Standard/Conservative Wound Care tried and failed greater than or equal to 30 days HBO Treatment Details Treatment Number: 115 Patient Type: Outpatient Chamber Type: Monoplace Chamber Serial #: [id number] Treatment Protocol: 2.0 ATA with 90 minutes oxygen, with two 5 minute air breaks Treatment Details Compression Rate Down: 2.0 psi / minute De-Compression Rate Up: 2.0 psi / minute A breaks and breathing ir Compress Tx Pressure periods Decompress Decompress Begins Reached (leave unused spaces Begins Ends blank) Chamber Pressure (ATA 1 2 2 2 2 2  --2 1 ) Clock Time (24 hr) 08:30 08:44 09:14 09:19 09:50 09:55 - - 10:25 10:34 Treatment Length: 124 (minutes) Treatment Segments: 4 Vital Signs Capillary Blood Glucose Reference Range: 80 - 120 mg / dl HBO Diabetic Blood Glucose Intervention Range: <131 mg/dl or >249 mg/dl Time Vitals Blood Respiratory Capillary Blood Glucose Pulse Action Type: Pulse: Temperature: Taken: Pressure: Rate: Glucose (mg/dl): Meter #: Oximetry (%) Taken: Pre 08:24 134/87 86 16 97.3 196 Post 10:38 176/84 72 18 97.2 197 Treatment Response Treatment Toleration: Well Treatment Completion Status: Treatment Completed without Adverse Event Additional Procedure Documentation Tissue Sevierity: Necrosis of  bone Physician HBO Attestation: I certify that I supervised this HBO treatment in accordance with Medicare guidelines. A trained emergency response team is readily available per Yes hospital policies and procedures. Continue HBOT as ordered. Yes Electronic Signature(s) Signed: 03/18/2022 2:41:04 PM By: [name] MD FACS Previous Signature: 03/18/2022 1:49:09 PM Version By: [name] EMT Entered By: [name] on 03/18/2022 14:41:03 -------------------------------------------------------------------------------- HBO Safety Checklist Details Patient Name: Date of Service: [name] A. 03/18/2022 8:00 A M Medical Record Number: [id number] Patient Account Number: [id number] Date of Birth/Sex: Treating RN: [birth year] (66 y.o. [name], [name] Primary Care Cloyce Paterson: [name] Other Clinician: [name] Referring Amulya Quintin: Treating Alisandra Son/Extender: [name] Weeks in Treatment: 29 HBO Safety Checklist Items Safety Checklist Consent Form Signed Patient voided / foley secured and emptied When did you last eato 0636 Last dose of injectable or oral agent 0967 Ostomy pouch emptied and vented if applicable NA All implantable devices assessed, documented and approved NA Intravenous access site secured and place NA Valuables secured Linens and cotton and cotton/polyester blend (less than 51% polyester) Personal oil-based products / skin lotions / body lotions removed Wigs or hairpieces removed NA Smoking or tobacco materials removed Books / newspapers / magazines / loose paper removed Cologne, aftershave, perfume and deodorant removed Jewelry removed (may wrap wedding band) NA Make-up removed NA Hair care products removed Battery operated devices (external) removed Heating patches and chemical warmers removed Titanium eyewear removed NA Nail polish cured greater than 10 hours NA Casting material cured greater than 10  hours NA Hearing aids removed NA Loose dentures or partials removed NA Prosthetics have been removed NA Patient demonstrates correct use of air break device (if applicable) Patient concerns have been addressed Patient grounding bracelet on and cord attached to chamber Specifics for Inpatients (complete  in addition to above) Medication sheet sent with patient NA Intravenous medications needed or due during therapy sent with patient NA Drainage tubes (e.g. nasogastric tube or chest tube secured and vented) NA Endotracheal or Tracheotomy tube secured NA Cuff deflated of air and inflated with saline NA Airway suctioned NA Notes The safety checklist was done before the treatment was started. Electronic Signature(s) Signed: 03/18/2022 1:47:53 PM By: [name] EMT Entered By: [name] on 03/18/2022 13:47:53

## 2022-03-18 NOTE — Progress Notes (Signed)
KHAN, CHURA ([id number]) Visit Report for 03/18/2022 Problem List Details Patient Name: Date of Service: [name] 03/18/2022 8:00 A M Medical Record Number: [id number] Patient Account Number: [id number] Date of Birth/Sex: Treating RN: [birth year] (66 y.o. 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 03/18/2022 Medical Record Number: [id number] Patient Account Number: [id number] Date of Birth/Sex: Treating RN: [birth year] (66 y.o. 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 E11.621 Type 2 diabetes  mellitus with foot ulcer I25.10 Atherosclerotic heart disease of native coronary artery without angina pectoris Facility Procedures CPT4 Code: [id number] Description: G0277-(Facility Use Only) HBOT full body chamber, 69min , ICD-10 Diagnosis Description E11.621 Type 2 diabetes mellitus with foot ulcer 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 M86.672 Other chronic  osteomyelitis, left ankle and foot 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 Modifier: Quantity: 4 Physician Procedures : CPT4 Code Description Modifier [id number] - WC PHYS HYPERBARIC OXYGEN THERAPY ICD-10 Diagnosis Description E11.621 Type 2 diabetes mellitus with foot ulcer 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

## 2022-03-21 ENCOUNTER — Encounter (HOSPITAL_BASED_OUTPATIENT_CLINIC_OR_DEPARTMENT_OTHER): Payer: BC Managed Care – PPO | Admitting: General Surgery

## 2022-03-21 DIAGNOSIS — I2510 Atherosclerotic heart disease of native coronary artery without angina pectoris: Secondary | ICD-10-CM | POA: Diagnosis not present

## 2022-03-21 DIAGNOSIS — L97324 Non-pressure chronic ulcer of left ankle with necrosis of bone: Secondary | ICD-10-CM | POA: Diagnosis not present

## 2022-03-21 DIAGNOSIS — M86672 Other chronic osteomyelitis, left ankle and foot: Secondary | ICD-10-CM | POA: Diagnosis not present

## 2022-03-21 DIAGNOSIS — E1169 Type 2 diabetes mellitus with other specified complication: Secondary | ICD-10-CM | POA: Diagnosis not present

## 2022-03-21 DIAGNOSIS — E11621 Type 2 diabetes mellitus with foot ulcer: Secondary | ICD-10-CM | POA: Diagnosis not present

## 2022-03-21 DIAGNOSIS — E1140 Type 2 diabetes mellitus with diabetic neuropathy, unspecified: Secondary | ICD-10-CM | POA: Diagnosis not present

## 2022-03-21 DIAGNOSIS — E11610 Type 2 diabetes mellitus with diabetic neuropathic arthropathy: Secondary | ICD-10-CM | POA: Diagnosis not present

## 2022-03-21 DIAGNOSIS — Z951 Presence of aortocoronary bypass graft: Secondary | ICD-10-CM | POA: Diagnosis not present

## 2022-03-21 LAB — GLUCOSE, CAPILLARY
Glucose-Capillary: 149 mg/dL — ABNORMAL HIGH (ref 70–99)
Glucose-Capillary: 154 mg/dL — ABNORMAL HIGH (ref 70–99)

## 2022-03-21 NOTE — Progress Notes (Addendum)
CEDRIK, HEINDL A ([id number]) [id number] Page 1 of 2 Visit Report for 03/21/2022 HBO Details Patient Name: Date of Service: [name] 03/21/2022 8:00 A M Medical Record Number: [id number] Patient Account Number: [id number] Date of Birth/Sex: Treating RN: [birth year] (66 y.o. [name], [name] Primary Care Jamell Laymon: [name] Other Clinician: [name] Referring Zella Dewan: Treating Brithany Whitworth/Extender: [name] Weeks in Treatment: 66 HBO Treatment Course Details Treatment Course Number: 1 Ordering Haziel Molner: [name] T Treatments Ordered: otal 120 HBO Treatment Start Date: 09/20/2021 HBO Indication: Diabetic Ulcer(s) of the Lower Extremity Standard/Conservative Wound Care tried and failed greater than or equal to 30 days HBO Treatment Details Treatment Number: 116 Patient Type: Outpatient Chamber Type: Monoplace Chamber Serial #: [id number] Treatment Protocol: 2.0 ATA with 90 minutes oxygen, with two 5 minute air breaks Treatment Details Compression Rate Down: 2.0 psi / minute De-Compression Rate Up: 2.0 psi / minute A breaks and breathing ir Compress Tx Pressure periods Decompress Decompress Begins Reached (leave unused spaces Begins Ends blank) Chamber Pressure (ATA 1 2 2 2 2 2  --2 1 ) Clock Time (24 hr) 08:38 08:51 09:22 09:27 09:57 10:02 - - 10:32 10:41 Treatment Length: 123 (minutes) Treatment Segments: 4 Vital Signs Capillary Blood Glucose Reference Range: 80 - 120 mg / dl HBO Diabetic Blood Glucose Intervention Range: <131 mg/dl or >249 mg/dl Time Vitals Blood Respiratory Capillary Blood Glucose Pulse Action Type: Pulse: Temperature: Taken: Pressure: Rate: Glucose (mg/dl): Meter #: Oximetry (%) Taken: Pre 08:27 136/78 72 16 97.2 149 Post 10:47 160/93 66 18 97.2 154 Treatment Response Treatment Toleration: Well Treatment Completion Status: Treatment Completed without Adverse Event Additional Procedure  Documentation Tissue Sevierity: Necrosis of bone Physician HBO Attestation: I certify that I supervised this HBO treatment in accordance with Medicare guidelines. A trained emergency response team is readily available per Yes hospital policies and procedures. Continue HBOT as ordered. Yes Electronic Signature(s) Signed: 03/21/2022 12:20:59 PM By: [name] MD FACS Previous Signature: 03/21/2022 11:44:44 AM Version By: [name] EMT Entered By: [name] on 03/21/2022 12:20:58 [name] ([id number]) [id number] Page 2 of 2 -------------------------------------------------------------------------------- HBO Safety Checklist Details Patient Name: Date of Service: [name] 03/21/2022 8:00 A M Medical Record Number: [id number] Patient Account Number: [id number] Date of Birth/Sex: Treating RN: [birth year] (66 y.o. [name], [name] Primary Care Clair Bardwell: [name] Other Clinician: [name] Referring Terianna Peggs: Treating Jaquan Sadowsky/Extender: [name] Weeks in Treatment: 29 HBO Safety Checklist Items Safety Checklist Consent Form Signed Patient voided / foley secured and emptied When did you last eato 0630 Last dose of injectable or oral agent 0630 Ostomy pouch emptied and vented if applicable NA All implantable devices assessed, documented and approved NA Intravenous access site secured and place NA Valuables secured Linens and cotton and cotton/polyester blend (less than 51% polyester) Personal oil-based products / skin lotions / body lotions removed Wigs or hairpieces removed NA Smoking or tobacco materials removed Books / newspapers / magazines / loose paper removed Cologne, aftershave, perfume and deodorant removed Jewelry removed (may wrap wedding band) NA Make-up removed NA Hair care products removed Battery operated devices (external) removed Heating patches and chemical warmers  removed Titanium eyewear removed NA Nail polish cured greater than 10 hours NA Casting material cured greater than 10 hours NA Hearing aids removed NA Loose dentures or partials removed NA Prosthetics have been removed NA Patient demonstrates correct use of air break device (if applicable) Patient concerns have been  addressed Patient grounding bracelet on and cord attached to chamber Specifics for Inpatients (complete in addition to above) Medication sheet sent with patient NA Intravenous medications needed or due during therapy sent with patient NA Drainage tubes (e.g. nasogastric tube or chest tube secured and vented) NA Endotracheal or Tracheotomy tube secured NA Cuff deflated of air and inflated with saline NA Airway suctioned NA Notes The safety checklist was done before the treatment was started. Electronic Signature(s) Signed: 03/21/2022 11:43:02 AM By: [name] EMT Entered By: [name] on 03/21/2022 11:43:02

## 2022-03-21 NOTE — Progress Notes (Signed)
[name] [name] ([id number]) [id number] Page 1 of 2 Visit Report for 03/21/2022 Arrival Information Details Patient Name: Date of Service: NA [name]. 03/21/2022 8:00 [name] [name] Medical Record Number: [id number] Patient Account Number: [id number] Date of Birth/Sex: Treating RN: 1955-06-16 (66 y.o. [name], Tammi Klippel Primary Care Edison Wollschlager: [name] Other Clinician: [name] Referring Bodhi Moradi: Treating Travarus Trudo/Extender: [name] in Treatment: 29 Visit Information History Since Last Visit All ordered tests and consults were completed: Yes Patient Arrived: Knee Scooter Added or deleted any medications: No Arrival Time: 08:19 Any new allergies or adverse reactions: No Accompanied By: Wife Had [name] fall or experienced change in No Transfer Assistance: None activities of daily living that may affect Patient Identification Verified: Yes risk of falls: Secondary Verification Process Completed: Yes Signs or symptoms of abuse/neglect since last visito No Patient Requires Transmission-Based Precautions: No Hospitalized since last visit: No Patient Has Alerts: Yes Implantable device outside of the clinic excluding No Patient Alerts: Patient on Blood Thinner cellular tissue based products placed in the center since last visit: Pain Present Now: No Electronic Signature(s) Signed: 03/21/2022 11:41:47 AM By: [name] EMT Entered By: [name] on 03/21/2022 11:41:47 -------------------------------------------------------------------------------- Encounter Discharge Information Details Patient Name: Date of Service: NA [name]. 03/21/2022 8:00 [name] [name] Medical Record Number: [id number] Patient Account Number: [id number] Date of Birth/Sex: Treating RN: [birth year] (66 y.o. [name], Tammi Klippel Primary Care Talesha Ellithorpe: [name] Other Clinician: [name] Referring Romona Murdy: Treating Adilenne Ashworth/Extender: [name] in Treatment: 29 Encounter Discharge Information Items Discharge Condition: Stable Ambulatory Status: Knee Scooter Discharge Destination: Home Transportation: Private Auto Accompanied By: None Schedule Follow-up Appointment: Yes Clinical Summary of Care: Electronic Signature(s) Signed: 03/21/2022 11:46:00 AM By: [name] EMT Entered By: [name] on 03/21/2022 11:46:00 [name] ([id number]) [id number] Page 2 of 2 -------------------------------------------------------------------------------- Vitals Details Patient Name: Date of Service: NA [name]. 03/21/2022 8:00 [name] [name] Medical Record Number: [id number] Patient Account Number: [id number] Date of Birth/Sex: Treating RN: May 11, 1956 (66 y.o. [name], Tammi Klippel Primary Care Shukri Nistler: [name] Other Clinician: [name] Referring Haly Feher: Treating Jennalyn Cawley/Extender: [name] Weeks in Treatment: 29 Vital Signs Time Taken: 08:27 Temperature (F): 97.2 Height (in): 74 Pulse (bpm): 72 Weight (lbs): 186 Respiratory Rate (breaths/min): 16 Body Mass Index (BMI): 23.9 Blood Pressure (mmHg): 136/78 Capillary Blood Glucose (mg/dl): 149 Reference Range: 80 - 120 mg / dl Electronic Signature(s) Signed: 03/21/2022 11:42:14 AM By: [name] EMT Entered By: [name] on 03/21/2022 11:42:14

## 2022-03-21 NOTE — Progress Notes (Signed)
ALWIN, LANIGAN A ([id number]) [id number] Page 1 of 2 Visit Report for 03/21/2022 Problem List Details Patient Name: Date of Service: Colin [name] 03/21/2022 8:00 A M Medical Record Number: [id number] Patient Account Number: [id number] Date of Birth/Sex: Treating RN: 1956/04/23 (66 y.o. 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 03/21/2022 Medical Record Number: [id number] Patient Account Number: [id number] Date of Birth/Sex: Treating RN: 03-04-1956 (66 y.o. [name] Primary Care Provider: [name] Other Clinician: Valeria [name] Referring Provider: Treating Provider/Extender: [name] Weeks in Treatment: [address] 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 E11.621 Type 2 diabetes mellitus with foot ulcer I25.10 Atherosclerotic heart disease of native coronary artery without angina pectoris Facility Procedures : CPT4 Code: [id number] Description: G0277-(Facility Use Only) HBOT full body chamber, 30min , ICD-10 Diagnosis Description E11.621 Type 2 diabetes mellitus with foot ulcer L97.324 Non-pressure chronic ulcer of left ankle with necrosis of M86.672 Other chronic osteomyelitis,  left ankle and foot E11.610 Type 2 diabetes mellitus with diabetic neuropathic arthrop Modifier: bone athy Quantity: 4 Physician Procedures : CPT4 Code Description Modifier [id number] - WC PHYS HYPERBARIC OXYGEN THERAPY ICD-10 Diagnosis Description E11.621 Type 2 diabetes mellitus with foot ulcer 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

## 2022-03-22 ENCOUNTER — Encounter (HOSPITAL_BASED_OUTPATIENT_CLINIC_OR_DEPARTMENT_OTHER): Payer: BC Managed Care – PPO | Admitting: General Surgery

## 2022-03-22 DIAGNOSIS — E11621 Type 2 diabetes mellitus with foot ulcer: Secondary | ICD-10-CM | POA: Diagnosis not present

## 2022-03-22 DIAGNOSIS — Z951 Presence of aortocoronary bypass graft: Secondary | ICD-10-CM | POA: Diagnosis not present

## 2022-03-22 DIAGNOSIS — M86672 Other chronic osteomyelitis, left ankle and foot: Secondary | ICD-10-CM | POA: Diagnosis not present

## 2022-03-22 DIAGNOSIS — L97324 Non-pressure chronic ulcer of left ankle with necrosis of bone: Secondary | ICD-10-CM | POA: Diagnosis not present

## 2022-03-22 DIAGNOSIS — E1169 Type 2 diabetes mellitus with other specified complication: Secondary | ICD-10-CM | POA: Diagnosis not present

## 2022-03-22 DIAGNOSIS — E11610 Type 2 diabetes mellitus with diabetic neuropathic arthropathy: Secondary | ICD-10-CM | POA: Diagnosis not present

## 2022-03-22 DIAGNOSIS — E1140 Type 2 diabetes mellitus with diabetic neuropathy, unspecified: Secondary | ICD-10-CM | POA: Diagnosis not present

## 2022-03-22 DIAGNOSIS — I2510 Atherosclerotic heart disease of native coronary artery without angina pectoris: Secondary | ICD-10-CM | POA: Diagnosis not present

## 2022-03-22 LAB — GLUCOSE, CAPILLARY
Glucose-Capillary: 180 mg/dL — ABNORMAL HIGH (ref 70–99)
Glucose-Capillary: 178 mg/dL — ABNORMAL HIGH (ref 70–99)

## 2022-03-22 NOTE — Progress Notes (Signed)
MATAI, CARPENITO [name] ([id number]) [id number] Page 1 of 2 Visit Report for 03/22/2022 Arrival Information Details Patient Name: Date of Service: NA [name] 03/22/2022 8:00 [name] Medical Record Number: [id number] Patient Account Number: [id number] Date of Birth/Sex: Treating RN: 28-May-1956 (66 y.o. [name] Primary Care Kameo Bains: [name] Other Clinician: [name] Referring Jie Stickels: Treating Kealan Buchan/Extender: [name] in Treatment: 29 Visit Information History Since Last Visit All ordered tests and consults were completed: Yes Patient Arrived: Knee Scooter Added or deleted any medications: No Arrival Time: 08:26 Any new allergies or adverse reactions: No Accompanied By: Wife Had [name] fall or experienced change in No Transfer Assistance: None activities of daily living that may affect Patient Identification Verified: Yes risk of falls: Secondary Verification Process Completed: Yes Signs or symptoms of abuse/neglect since last visito No Patient Requires Transmission-Based Precautions: No Hospitalized since last visit: No Patient Has Alerts: Yes Implantable device outside of the clinic excluding No Patient Alerts: Patient on Blood Thinner cellular tissue based products placed in the center since last visit: Pain Present Now: No Electronic Signature(s) Signed: 03/22/2022 12:07:21 PM By: [name] EMT Entered By: [name] on 03/22/2022 12:07:21 -------------------------------------------------------------------------------- Encounter Discharge Information Details Patient Name: Date of Service: NA [name] 03/22/2022 8:00 [name] Medical Record Number: [id number] Patient Account Number: [id number] Date of Birth/Sex: Treating RN: 26-Apr-1956 (66 y.o. [name] Primary Care Rainbow Salman: [name] Other Clinician: [name] Referring Jaxyn Rout: Treating Rebeccah Ivins/Extender: [name] Weeks in Treatment: 29 Encounter Discharge Information Items Discharge Condition: Stable Ambulatory Status: Knee Scooter Discharge Destination: Home Transportation: Private Auto Accompanied By: Wife Schedule Follow-up Appointment: Yes Clinical Summary of Care: Electronic Signature(s) Signed: 03/22/2022 12:12:34 PM By: [name] EMT Entered By: [name] on 03/22/2022 12:12:34 [name] ([id number]) [id number] Page 2 of 2 -------------------------------------------------------------------------------- Vitals Details Patient Name: Date of Service: NA [name] 03/22/2022 8:00 [name] Medical Record Number: [id number] Patient Account Number: [id number] Date of Birth/Sex: Treating RN: Mar 19, 1956 (67 y.o. [name] Primary Care Ruie Sendejo: [name] Other Clinician: [name] Referring Rayel Santizo: Treating Tyr Franca/Extender: [name] Weeks in Treatment: 29 Vital Signs Time Taken: 08:37 Temperature (F): 97.9 Height (in): 74 Pulse (bpm): 74 Weight (lbs): 186 Respiratory Rate (breaths/min): 16 Body Mass Index (BMI): 23.9 Blood Pressure (mmHg): 144/80 Capillary Blood Glucose (mg/dl): 180 Reference Range: 80 - 120 mg / dl Electronic Signature(s) Signed: 03/22/2022 12:09:07 PM By: [name] EMT Entered By: [name] on 03/22/2022 12:09:07

## 2022-03-22 NOTE — Progress Notes (Addendum)
KANIEL, KIANG A ([id number]) [id number] Page 1 of 2 Visit Report for 03/22/2022 HBO Details Patient Name: Date of Service: NA Colin Nephew A. 03/22/2022 8:00 A M Medical Record Number: [id number] Patient Account Number: [id number] Date of Birth/Sex: Treating RN: 1955/09/26 (66 y.o. [name] Primary Care Stanislawa Gaffin: [name] Other Clinician: [name] Referring [name]: Treating [name]: [name]: 29 HBO Treatment Course Details Treatment Course Number: 1 Ordering Markitta Ausburn: [name] T Treatments Ordered: otal 120 HBO Treatment Start Date: 09/20/2021 HBO Indication: Diabetic Ulcer(s) of the Lower Extremity Standard/Conservative Wound Care tried and failed greater than or equal to 30 days HBO Treatment Details Treatment Number: 117 Patient Type: Outpatient Chamber Type: Monoplace Chamber Serial #: [id number] Treatment Protocol: 2.0 ATA with 90 minutes oxygen, with two 5 minute air breaks Treatment Details Compression Rate Down: 2.0 psi / minute De-Compression Rate Up: 2.0 psi / minute A breaks and breathing ir Compress Tx Pressure periods Decompress Decompress Begins Reached (leave unused spaces Begins Ends blank) Chamber Pressure (ATA 1 2 2 2 2 2  --2 1 ) Clock Time (24 hr) 08:47 09:02 09:32 09:37 10:07 10:12 - - 10:42 10:53 Treatment Length: 126 (minutes) Treatment Segments: 4 Vital Signs Capillary Blood Glucose Reference Range: 80 - 120 mg / dl HBO Diabetic Blood Glucose Intervention Range: <131 mg/dl or mg/dl Time Vitals Blood Respiratory Capillary Blood Glucose Pulse Action Type: Pulse: Temperature: Taken: Pressure: Rate: Glucose (mg/dl): Meter #: Oximetry (%) Taken: Pre 08:37 144/80 74 16 97.9 180 Post 10:57 140/89 68 18 97.5 178 Treatment Response Treatment Toleration: Well Treatment Completion Status: Treatment Completed without Adverse Event Additional Procedure  Documentation Tissue Sevierity: Necrosis of bone Physician HBO Attestation: I certify that I supervised this HBO treatment in accordance with Medicare guidelines. A trained emergency response team is readily available per Yes hospital policies and procedures. Continue HBOT as ordered. Yes Electronic Signature(s) Signed: 03/22/2022 12:34:31 PM By: 05/22/2022 MD FACS Previous Signature: 03/22/2022 12:11:23 PM Version By: 05/22/2022 EMT Entered By: [name] on 03/22/2022 12:34:30 05/22/2022 A (Lasandra [id number].pdf Page 2 of 2 -------------------------------------------------------------------------------- HBO Safety Checklist Details Patient Name: Date of Service: NA ) [id number]. 03/22/2022 8:00 A M Medical Record Number: 05/22/2022 Patient Account Number: [id number] Date of Birth/Sex: Treating RN: 05-11-1956 (66 y.o. 79 Primary Care [name]: [name] Other Clinician: [name] Referring Elmor Kost: Treating Peyton Spengler/Extender: [name]: 29 HBO Safety Checklist Items Safety Checklist Consent Form Signed Patient voided / foley secured and emptied When did you last eato 0630 Last dose of injectable or oral agent 0630 Ostomy pouch emptied and vented if applicable NA All implantable devices assessed, documented and approved NA Intravenous access site secured and place NA Valuables secured Linens and cotton and cotton/polyester blend (less than 51% polyester) Personal oil-based products / skin lotions / body lotions removed Wigs or hairpieces removed NA Smoking or tobacco materials removed Books / newspapers / magazines / loose paper removed Cologne, aftershave, perfume and deodorant removed Jewelry removed (may wrap wedding band) NA Make-up removed NA Hair care products removed Battery operated devices (external) removed Heating patches and chemical warmers  removed Titanium eyewear removed NA Nail polish cured greater than 10 hours NA Casting material cured greater than 10 hours NA Hearing aids removed NA Loose dentures or partials removed NA Prosthetics have been removed NA Patient demonstrates correct use of air break device (if applicable) Patient concerns have been  addressed Patient grounding bracelet on and cord attached to chamber Specifics for Inpatients (complete in addition to above) Medication sheet sent with patient NA Intravenous medications needed or due during therapy sent with patient NA Drainage tubes (e.g. nasogastric tube or chest tube secured and vented) NA Endotracheal or Tracheotomy tube secured NA Cuff deflated of air and inflated with saline NA Airway suctioned NA Notes The safety checklist was done before the treatment was started. Electronic Signature(s) Signed: 03/22/2022 12:10:02 PM By: [name] EMT Entered By: [name] on 03/22/2022 12:10:02

## 2022-03-22 NOTE — Progress Notes (Signed)
QUINTAVIS, BRANDS [name] ([id number]) [id number] Page 1 of 2 Visit Report for 03/22/2022 Problem List Details Patient Name: Date of Service: NA [name]. 03/22/2022 8:00 [name] Medical Record Number: [id number] Patient Account Number: [id number] Date of Birth/Sex: Treating RN: 05/02/1956 (66 y.o. 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 03/22/2022 Medical Record Number: [id number] Patient Account Number: [id number] Date of Birth/Sex: Treating RN: [birth year] (66 y.o. [name] Primary Care Provider: [name] Other Clinician: [name] Referring Provider: Treating Provider/Extender: [name] Weeks in Treatment: [address] 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 E11.621 Type 2 diabetes mellitus with foot ulcer I25.10 Atherosclerotic heart disease of native coronary artery without angina pectoris Facility Procedures : CPT4 Code: [id number] Description: G0277-(Facility Use Only) HBOT full body chamber, 83min , ICD-10 Diagnosis Description E11.621 Type 2 diabetes mellitus with foot ulcer L97.324 Non-pressure chronic ulcer of left ankle with necrosis of M86.672 Other chronic osteomyelitis,  left ankle and foot E11.610 Type 2 diabetes mellitus with diabetic neuropathic arthrop Modifier: bone athy Quantity: 4 Physician Procedures : CPT4 Code Description Modifier [id number] 51025 - WC PHYS HYPERBARIC OXYGEN THERAPY ICD-10 Diagnosis Description E11.621 Type 2 diabetes mellitus with foot ulcer 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

## 2022-03-23 ENCOUNTER — Encounter (HOSPITAL_BASED_OUTPATIENT_CLINIC_OR_DEPARTMENT_OTHER): Payer: BC Managed Care – PPO | Admitting: General Surgery

## 2022-03-24 ENCOUNTER — Encounter (HOSPITAL_BASED_OUTPATIENT_CLINIC_OR_DEPARTMENT_OTHER): Payer: BC Managed Care – PPO | Admitting: General Surgery

## 2022-03-24 DIAGNOSIS — Z951 Presence of aortocoronary bypass graft: Secondary | ICD-10-CM | POA: Diagnosis not present

## 2022-03-24 DIAGNOSIS — E1169 Type 2 diabetes mellitus with other specified complication: Secondary | ICD-10-CM | POA: Diagnosis not present

## 2022-03-24 DIAGNOSIS — I2510 Atherosclerotic heart disease of native coronary artery without angina pectoris: Secondary | ICD-10-CM | POA: Diagnosis not present

## 2022-03-24 DIAGNOSIS — E1140 Type 2 diabetes mellitus with diabetic neuropathy, unspecified: Secondary | ICD-10-CM | POA: Diagnosis not present

## 2022-03-24 DIAGNOSIS — E11621 Type 2 diabetes mellitus with foot ulcer: Secondary | ICD-10-CM | POA: Diagnosis not present

## 2022-03-24 DIAGNOSIS — M86672 Other chronic osteomyelitis, left ankle and foot: Secondary | ICD-10-CM | POA: Diagnosis not present

## 2022-03-24 DIAGNOSIS — L97324 Non-pressure chronic ulcer of left ankle with necrosis of bone: Secondary | ICD-10-CM | POA: Diagnosis not present

## 2022-03-24 DIAGNOSIS — E11610 Type 2 diabetes mellitus with diabetic neuropathic arthropathy: Secondary | ICD-10-CM | POA: Diagnosis not present

## 2022-03-24 LAB — GLUCOSE, CAPILLARY
Glucose-Capillary: 289 mg/dL — ABNORMAL HIGH (ref 70–99)
Glucose-Capillary: 279 mg/dL — ABNORMAL HIGH (ref 70–99)

## 2022-03-24 NOTE — Progress Notes (Signed)
YUVAAN, OLANDER [name] ([id number]) [id number] Page 1 of 2 Visit Report for 03/24/2022 Problem List Details Patient Name: Date of Service: NA [name]. 03/24/2022 8:00 [name] Medical Record Number: [id number] Patient Account Number: [id number] Date of Birth/Sex: Treating RN: April 24, 1956 (66 y.o. 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 03/24/2022 Medical Record Number: [id number] Patient Account Number: [id number] Date of Birth/Sex: Treating RN: 09-13-1955 (66 y.o. 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 E11.621 Type 2 diabetes mellitus with foot ulcer I25.10 Atherosclerotic heart disease of native coronary artery without angina pectoris Facility Procedures : CPT4 Code: 60630160 Description: G0277-(Facility Use Only) HBOT full body chamber, 45min , ICD-10 Diagnosis Description E11.621 Type 2 diabetes mellitus with foot ulcer L97.324 Non-pressure chronic ulcer of left ankle with necrosis of M86.672 Other chronic osteomyelitis,  left ankle and foot E11.610 Type 2 diabetes mellitus with diabetic neuropathic arthrop Modifier: bone athy Quantity: 4 Physician Procedures : CPT4 Code Description Modifier [id number] - WC PHYS HYPERBARIC OXYGEN THERAPY ICD-10 Diagnosis Description E11.621 Type 2 diabetes mellitus with foot ulcer L97.324 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

## 2022-03-24 NOTE — Progress Notes (Addendum)
[name], Colin A ([id number]) [id number] Page 1 of 2 Visit Report for 03/24/2022 HBO Details Patient Name: Date of Service: NA Colin Nephew A. 03/24/2022 8:00 A M Medical Record Number: [id number] Patient Account Number: [id number] Date of Birth/Sex: Treating RN: 10-May-1956 (66 y.o. [name], [name] Primary Care Enio Hornback: [name] Other Clinician: [name] Referring Hurley Blevins: Treating Maanav Kassabian/Extender: [name]: 30 HBO Treatment Course Details Treatment Course Number: 1 Ordering Lin Hackmann: [name] T Treatments Ordered: otal 120 HBO Treatment Start Date: 09/20/2021 HBO Indication: Diabetic Ulcer(s) of the Lower Extremity Standard/Conservative Wound Care tried and failed greater than or equal to 30 days HBO Treatment Details Treatment Number: 118 Patient Type: Outpatient Chamber Type: Monoplace Chamber Serial #: [id number] Treatment Protocol: 2.0 ATA with 90 minutes oxygen, with two 5 minute air breaks Treatment Details Compression Rate Down: 2.0 psi / minute De-Compression Rate Up: 2.0 psi / minute A breaks and breathing ir Compress Tx Pressure periods Decompress Decompress Begins Reached (leave unused spaces Begins Ends blank) Chamber Pressure (ATA 1 2 2 2 2 2  --2 1 ) Clock Time (24 hr) 08:49 09:01 09:31 09:36 10:06 10:11 - - 10:41 10:52 Treatment Length: 123 (minutes) Treatment Segments: 4 Vital Signs Capillary Blood Glucose Reference Range: 80 - 120 mg / dl HBO Diabetic Blood Glucose Intervention Range: <131 mg/dl or mg/dl Time Vitals Blood Respiratory Capillary Blood Glucose Pulse Action Type: Pulse: Temperature: Taken: Pressure: Rate: Glucose (mg/dl): Meter #: Oximetry (%) Taken: Pre 08:44 142/89 71 16 98.1 289 Post 10:55 118/84 68 18 98.1 279 Treatment Response Treatment Toleration: Well Treatment Completion Status: Treatment Completed without Adverse Event Additional  Procedure Documentation Tissue Sevierity: Necrosis of bone Physician HBO Attestation: I certify that I supervised this HBO treatment in accordance with Medicare guidelines. A trained emergency response team is readily available per Yes hospital policies and procedures. Continue HBOT as ordered. Yes Electronic Signature(s) Signed: 03/24/2022 3:04:38 PM By: 05/24/2022 MD FACS Previous Signature: 03/24/2022 1:59:44 PM Version By: 05/24/2022 EMT Entered By: [name] on 03/24/2022 15:04:38 05/24/2022 A ([id number] Page 2 of 2 -------------------------------------------------------------------------------- HBO Safety Checklist Details Patient Name: Date of Service: NA ) [id number]. 03/24/2022 8:00 A M Medical Record Number: 05/24/2022 Patient Account Number: [id number] Date of Birth/Sex: Treating RN: [birth year] (66 y.o. 79, [name] Primary Care [name]: [name] Other Clinician: [name] Referring Quantavia Frith: Treating Davon Abdelaziz/Extender: [name]: 30 HBO Safety Checklist Items Safety Checklist Consent Form Signed Patient voided / foley secured and emptied When did you last eato 0615 Last dose of injectable or oral agent 0615 Ostomy pouch emptied and vented if applicable NA All implantable devices assessed, documented and approved NA Intravenous access site secured and place NA Valuables secured Linens and cotton and cotton/polyester blend (less than 51% polyester) Personal oil-based products / skin lotions / body lotions removed Wigs or hairpieces removed NA Smoking or tobacco materials removed Books / newspapers / magazines / loose paper removed Cologne, aftershave, perfume and deodorant removed Jewelry removed (may wrap wedding band) NA Make-up removed NA Hair care products removed Battery operated devices (external) removed Heating patches and chemical warmers  removed Titanium eyewear removed NA Nail polish cured greater than 10 hours NA Casting material cured greater than 10 hours NA Hearing aids removed NA Loose dentures or partials removed NA Prosthetics have been removed NA Patient demonstrates correct use of air break device (if applicable) Patient concerns have been  addressed Patient grounding bracelet on and cord attached to chamber Specifics for Inpatients (complete in addition to above) Medication sheet sent with patient NA Intravenous medications needed or due during therapy sent with patient NA Drainage tubes (e.g. nasogastric tube or chest tube secured and vented) NA Endotracheal or Tracheotomy tube secured NA Cuff deflated of air and inflated with saline NA Airway suctioned NA Notes The safety checklist was done before the treatment was started. Electronic Signature(s) Signed: 03/24/2022 1:56:54 PM By: [name] EMT Entered By: [name] on 03/24/2022 13:56:53

## 2022-03-24 NOTE — Progress Notes (Addendum)
[name], [name] ([id number]) [id number] Page 1 of 2 Visit Report for 03/24/2022 Arrival Information Details Patient Name: Date of Service: NA [name]. 03/24/2022 8:00 [name] Medical Record Number: [id number] Patient Account Number: [id number] Date of Birth/Sex: Treating RN: 06-Feb-1956 (66 y.o. [name], [name] Primary Care Harvest Deist: [name] Other Clinician: [name] Referring Mellony Danziger: Treating Yasmin Bronaugh/Extender: [name] in Treatment: 40 Visit Information History Since Last Visit All ordered tests and consults were completed: Yes Patient Arrived: Knee Scooter Added or deleted any medications: No Arrival Time: 08:32 Any new allergies or adverse reactions: No Accompanied By: Wife Had [name] fall or experienced change in No Transfer Assistance: None activities of daily living that may affect Patient Identification Verified: Yes risk of falls: Secondary Verification Process Completed: Yes Signs or symptoms of abuse/neglect since last visito No Patient Requires Transmission-Based Precautions: No Hospitalized since last visit: No Patient Has Alerts: Yes Implantable device outside of the clinic excluding No Patient Alerts: Patient on Blood Thinner cellular tissue based products placed in the center since last visit: Pain Present Now: No Electronic Signature(s) Signed: 03/24/2022 1:54:05 PM By: [name] EMT Entered By: [name] on 03/24/2022 13:54:05 -------------------------------------------------------------------------------- Encounter Discharge Information Details Patient Name: Date of Service: NA [name]. 03/24/2022 8:00 [name] Medical Record Number: [id number] Patient Account Number: [id number] Date of Birth/Sex: Treating RN: 08-18-1955 (66 y.o. [name], [name] Primary Care Charie Pinkus: [name] Other Clinician: [name] Referring Kathleene Bergemann: Treating Lamoine Magallon/Extender: [name] in Treatment: 30 Encounter Discharge Information Items Discharge Condition: Stable Ambulatory Status: Knee Scooter Discharge Destination: Home Transportation: Private Auto Accompanied By: None Schedule Follow-up Appointment: Yes Clinical Summary of Care: Electronic Signature(s) Signed: 03/24/2022 2:03:59 PM By: [name] EMT Entered By: [name] on 03/24/2022 14:03:59 [name] ([id number]) [id number] Page 2 of 2 -------------------------------------------------------------------------------- Vitals Details Patient Name: Date of Service: NA [name]. 03/24/2022 8:00 [name] Medical Record Number: [id number] Patient Account Number: [id number] Date of Birth/Sex: Treating RN: November 13, 1955 (66 y.o. [name], [name] Primary Care Naela Nodal: [name] Other Clinician: [name] Referring Cherise Fedder: Treating Trever Streater/Extender: [name] Weeks in Treatment: 30 Vital Signs Time Taken: 08:44 Temperature (F): 98.1 Height (in): 74 Pulse (bpm): 71 Weight (lbs): 186 Respiratory Rate (breaths/min): 16 Body Mass Index (BMI): 23.9 Blood Pressure (mmHg): 142/89 Capillary Blood Glucose (mg/dl): 289 Reference Range: 80 - 120 mg / dl Electronic Signature(s) Signed: 03/24/2022 1:54:55 PM By: [name] EMT Entered By: [name] on 03/24/2022 13:54:55

## 2022-03-25 ENCOUNTER — Encounter (HOSPITAL_BASED_OUTPATIENT_CLINIC_OR_DEPARTMENT_OTHER): Payer: BC Managed Care – PPO | Admitting: Internal Medicine

## 2022-03-25 DIAGNOSIS — E11622 Type 2 diabetes mellitus with other skin ulcer: Secondary | ICD-10-CM | POA: Diagnosis not present

## 2022-03-25 DIAGNOSIS — L97324 Non-pressure chronic ulcer of left ankle with necrosis of bone: Secondary | ICD-10-CM | POA: Diagnosis not present

## 2022-03-25 DIAGNOSIS — E11621 Type 2 diabetes mellitus with foot ulcer: Secondary | ICD-10-CM | POA: Diagnosis not present

## 2022-03-25 DIAGNOSIS — E11610 Type 2 diabetes mellitus with diabetic neuropathic arthropathy: Secondary | ICD-10-CM | POA: Diagnosis not present

## 2022-03-25 DIAGNOSIS — M86672 Other chronic osteomyelitis, left ankle and foot: Secondary | ICD-10-CM | POA: Diagnosis not present

## 2022-03-25 DIAGNOSIS — Z951 Presence of aortocoronary bypass graft: Secondary | ICD-10-CM | POA: Diagnosis not present

## 2022-03-25 DIAGNOSIS — E1169 Type 2 diabetes mellitus with other specified complication: Secondary | ICD-10-CM | POA: Diagnosis not present

## 2022-03-25 DIAGNOSIS — I2510 Atherosclerotic heart disease of native coronary artery without angina pectoris: Secondary | ICD-10-CM | POA: Diagnosis not present

## 2022-03-25 DIAGNOSIS — E1140 Type 2 diabetes mellitus with diabetic neuropathy, unspecified: Secondary | ICD-10-CM | POA: Diagnosis not present

## 2022-03-25 DIAGNOSIS — L97522 Non-pressure chronic ulcer of other part of left foot with fat layer exposed: Secondary | ICD-10-CM | POA: Diagnosis not present

## 2022-03-25 LAB — GLUCOSE, CAPILLARY
Glucose-Capillary: 175 mg/dL — ABNORMAL HIGH (ref 70–99)
Glucose-Capillary: 210 mg/dL — ABNORMAL HIGH (ref 70–99)

## 2022-03-25 NOTE — Progress Notes (Addendum)
TIMOTY, BOURKE A ([id number]) [id number] Page 1 of 2 Visit Report for 03/25/2022 Arrival Information Details Patient Name: Date of Service: [name] 03/25/2022 8:00 A M Medical Record Number: [id number] Patient Account Number: [id number] Date of Birth/Sex: Treating RN: Jun 05, 1956 ([name]) Dellie Catholic Primary Care Atiyana Welte: [name] Other Clinician: [name] Referring Vance Hochmuth: Treating Koleson Reifsteck/Extender: Winfred Leeds in Treatment: 63 Visit Information History Since Last Visit All ordered tests and consults were completed: Yes Patient Arrived: Knee Scooter Added or deleted any medications: No Arrival Time: 08:08 Any new allergies or adverse reactions: No Accompanied By: Wife Had a fall or experienced change in No Transfer Assistance: None activities of daily living that may affect Patient Identification Verified: Yes risk of falls: Secondary Verification Process Completed: Yes Signs or symptoms of abuse/neglect since last visito No Patient Requires Transmission-Based Precautions: No Hospitalized since last visit: No Patient Has Alerts: Yes Implantable device outside of the clinic excluding No Patient Alerts: Patient on Blood Thinner cellular tissue based products placed in the center since last visit: Pain Present Now: No Electronic Signature(s) Signed: 03/25/2022 2:21:19 PM By: [name] EMT Entered By: [name] on 03/25/2022 14:21:19 -------------------------------------------------------------------------------- Encounter Discharge Information Details Patient Name: Date of Service: [name] 03/25/2022 8:00 A M Medical Record Number: [id number] Patient Account Number: [id number] Date of Birth/Sex: Treating RN: [birth year] ([name]) Dellie Catholic Primary Care Vilas Edgerly: [name] Other Clinician: [name] Referring Nirvaan Frett: Treating Vennela Jutte/Extender: Ladona Ridgel Weeks in Treatment: 30 Encounter Discharge Information Items Discharge Condition: Stable Ambulatory Status: Knee Scooter Discharge Destination: Home Transportation: Private Auto Accompanied By: Wife Schedule Follow-up Appointment: No Clinical Summary of Care: Electronic Signature(s) Signed: 03/25/2022 2:41:35 PM By: [name] EMT Entered By: [name] on 03/25/2022 14:41:35 [name] ([id number]) [id number] Page 2 of 2 -------------------------------------------------------------------------------- Vitals Details Patient Name: Date of Service: [name] 03/25/2022 8:00 A M Medical Record Number: [id number] Patient Account Number: [id number] Date of Birth/Sex: Treating RN: 1956-01-28 ([name]) Dellie Catholic Primary Care Trapper Meech: [name] Other Clinician: [name] Referring Lexxi Koslow: Treating Clementina Mareno/Extender: Ladona Ridgel Weeks in Treatment: 30 Vital Signs Time Taken: 08:28 Temperature (F): 97.2 Height (in): 74 Pulse (bpm): 75 Weight (lbs): 186 Respiratory Rate (breaths/min): 16 Body Mass Index (BMI): 23.9 Blood Pressure (mmHg): 127/68 Capillary Blood Glucose (mg/dl): 175 Reference Range: 80 - 120 mg / dl Electronic Signature(s) Signed: 03/25/2022 2:21:51 PM By: [name] EMT Entered By: [name] on 03/25/2022 14:21:51

## 2022-03-25 NOTE — Progress Notes (Signed)
Colin, [name] [name] ([id number]) [id number] Page 1 of 2 Visit Report for 03/25/2022 HBO Details Patient Name: Date of Service: NA Colin Philips [name]. 03/25/2022 8:00 [name] Medical Record Number: [id number] Patient Account Number: [id number] Date of Birth/Sex: Treating RN: 23-May-1956 (66 y.o. [name] Primary Care [name]: [name] Other Clinician: Valeria [name] Referring [name]: Treating [name]/Extender: [name] in Treatment: 30 HBO Treatment Course Details Treatment Course Number: 1 Ordering [name]: [name] T Treatments Ordered: otal 120 HBO Treatment Start 09/20/2021 Date: HBO Indication: Diabetic Ulcer(s) of the Lower Extremity HBO Treatment End 03/25/2022 Date: Standard/Conservative Wound Care tried and failed greater than or equal to 30 days HBO Discharge Treatment Series Complete; Improved Wound Outcome: Perfusion HBO Treatment Details Treatment Number: 767 Patient Type: Outpatient Chamber Type: Monoplace Chamber Serial #: [id number] Treatment Protocol: 2.0 ATA with 90 minutes oxygen, with two 5 minute air breaks Treatment Details Compression Rate Down: 2.0 psi / minute De-Compression Rate Up: 2.0 psi / minute [name] breaks and breathing ir Compress Tx Pressure periods Decompress Decompress Begins Reached (leave unused spaces Begins Ends blank) Chamber Pressure (ATA 1 2 2 2 2 2  --2 1 ) Clock Time (24 hr) 08:34 08:46 09:16 09:21 09:51 09:56 - - 10:26 10:37 Treatment Length: 123 (minutes) Treatment Segments: 4 Vital Signs Capillary Blood Glucose Reference Range: 80 - 120 mg / dl HBO Diabetic Blood Glucose Intervention Range: <131 mg/dl or >249 mg/dl Time Vitals Blood Respiratory Capillary Blood Glucose Pulse Action Type: Pulse: Temperature: Taken: Pressure: Rate: Glucose (mg/dl): Meter #: Oximetry (%) Taken: Pre 08:28 127/68 75 16 97.2 175 Post 10:41 160/87 70 18 97.2 210 Treatment  Response Treatment Completion Status: Treatment Completed without Adverse Event Additional Procedure Documentation Tissue Sevierity: Necrosis of bone [name] Notes Patient's last treatment was today he tolerated this well. He was also seen for wound care evaluation Physician HBO Attestation: I certify that I supervised this HBO treatment in accordance with Medicare guidelines. [name] trained emergency response team is readily available per Yes hospital policies and procedures. Continue HBOT as ordered. Yes Electronic Signature(s) Signed: 03/27/2022 7:19:32 PM By: Colin Ham MD Previous Signature: 03/25/2022 2:39:48 PM Version By: [name] EMT Entered By: [name] on 03/25/2022 16:59:18 [name] ([id number]) [id number] Page 2 of 2 -------------------------------------------------------------------------------- HBO Safety Checklist Details Patient Name: Date of Service: NA Colin Philips [name]. 03/25/2022 8:00 [name] Medical Record Number: [id number] Patient Account Number: [id number] Date of Birth/Sex: Treating RN: 1956-04-23 (66 y.o. [name] Primary Care [name]: [name] Other Clinician: Valeria [name] Referring Jaidee Stipe: Treating Evangelyne Loja/Extender: [name] in Treatment: 30 HBO Safety Checklist Items Safety Checklist Consent Form Signed Patient voided / foley secured and emptied When did you last eato 0630 Last dose of injectable or oral agent 0630 Ostomy pouch emptied and vented if applicable NA All implantable devices assessed, documented and approved NA Intravenous access site secured and place NA Valuables secured Linens and cotton and cotton/polyester blend (less than 51% polyester) Personal oil-based products / skin lotions / body lotions removed Wigs or hairpieces removed NA Smoking or tobacco materials removed Books / newspapers / magazines / loose paper removed Cologne, aftershave,  perfume and deodorant removed Jewelry removed (may wrap wedding band) NA Make-up removed NA Hair care products removed Battery operated devices (external) removed Heating patches and chemical warmers removed Titanium eyewear removed NA Nail polish cured greater than 10 hours NA Casting material cured greater than 10 hours NA  Hearing aids removed NA Loose dentures or partials removed NA Prosthetics have been removed NA Patient demonstrates correct use of air break device (if applicable) Patient concerns have been addressed Patient grounding bracelet on and cord attached to chamber Specifics for Inpatients (complete in addition to above) Medication sheet sent with patient NA Intravenous medications needed or due during therapy sent with patient NA Drainage tubes (e.g. nasogastric tube or chest tube secured and vented) Endotracheal or Tracheotomy tube secured NA Cuff deflated of air and inflated with saline NA Airway suctioned NA Notes The safety checklist was done before the treatment was started. Electronic Signature(s) Signed: 03/25/2022 2:25:12 PM By: [name] EMT Previous Signature: 03/25/2022 2:23:11 PM Version By: [name] EMT Entered By: [name] on 03/25/2022 14:25:12

## 2022-03-28 NOTE — Progress Notes (Signed)
DAMARRION, [name] A ([id number]) [id number] Page 1 of 7 Visit Report for 03/25/2022 Arrival Information Details Patient Name: Date of Service: NA [name] 03/25/2022 10:45 A M Medical Record Number: [id number] Patient Account Number: [id number] Date of Birth/Sex: Treating RN: 16-Oct-1955 (66 y.o. [name] Primary Care Victorhugo Preis: [name] Other Clinician: Referring Hancel Ion: Treating Elizbeth Posa/Extender: Winfred Leeds in Treatment: 61 Visit Information History Since Last Visit Added or deleted any medications: No Patient Arrived: Knee Scooter Any new allergies or adverse reactions: No Arrival Time: 11:29 Had a fall or experienced change in No Accompanied By: self activities of daily living that may affect Transfer Assistance: None risk of falls: Patient Requires Transmission-Based Precautions: No Signs or symptoms of abuse/neglect since last visito No Patient Has Alerts: Yes Hospitalized since last visit: No Patient Alerts: Patient on Blood Thinner Implantable device outside of the clinic excluding No cellular tissue based products placed in the center since last visit: Has Dressing in Place as Prescribed: Yes Has Compression in Place as Prescribed: Yes Pain Present Now: No Electronic Signature(s) Signed: 03/25/2022 4:55:30 PM By: Adline Peals Entered By: Adline Peals on 03/25/2022 11:30:12 -------------------------------------------------------------------------------- Encounter Discharge Information Details Patient Name: Date of Service: Colin Loveless A. 03/25/2022 10:45 A M Medical Record Number: [id number] Patient Account Number: [id number] Date of Birth/Sex: Treating RN: 1955/08/25 (66 y.o. [name] Primary Care Aiysha Jillson: [name] Other Clinician: Referring Debbra Digiulio: Treating Mylena Sedberry/Extender: Winfred Leeds in Treatment: 30 Encounter Discharge Information  Items Discharge Condition: Stable Ambulatory Status: Knee Scooter Discharge Destination: Home Transportation: Private Auto Accompanied By: self Schedule Follow-up Appointment: Yes Clinical Summary of Care: Patient Declined Electronic Signature(s) Signed: 03/25/2022 4:55:30 PM By: Adline Peals Entered By: Adline Peals on 03/25/2022 12:45:43 [name] ([id number]) [id number] Page 2 of 7 -------------------------------------------------------------------------------- Lower Extremity Assessment Details Patient Name: Date of Service: Colin Loveless A. 03/25/2022 10:45 A M Medical Record Number: [id number] Patient Account Number: [id number] Date of Birth/Sex: Treating RN: November 30, 1955 (66 y.o. [name] Primary Care Ian Cavey: [name] Other Clinician: Referring Sumire Halbleib: Treating Stepfanie Yott/Extender: Ladona Ridgel Weeks in Treatment: 30 Edema Assessment Assessed: Shirlyn Goltz: No] Patrice Paradise: No] Edema: [Left: Ye] [Right: s] Calf Left: Right: Point of Measurement: From Medial Instep 313.5 cm Ankle Left: Right: Point of Measurement: From Medial Instep 21.5 cm Vascular Assessment Pulses: Dorsalis Pedis Palpable: [Left:Yes] Electronic Signature(s) Signed: 03/25/2022 4:55:30 PM By: Adline Peals Entered By: Adline Peals on 03/25/2022 11:36:58 -------------------------------------------------------------------------------- Multi Wound Chart Details Patient Name: Date of Service: Colin Loveless A. 03/25/2022 10:45 A M Medical Record Number: [id number] Patient Account Number: [id number] Date of Birth/Sex: Treating RN: 1955/07/12 (66 y.o. [name] Primary Care Xadrian Craighead: [name] Other Clinician: Referring Brayah Urquilla: Treating Zayed Griffie/Extender: Winfred Leeds in Treatment: 30 Vital Signs Height(in): 74 Capillary Blood Glucose(mg/dl): 210 Weight(lbs): 186 Pulse(bpm): 26 Body  Mass Index(BMI): 23.9 Blood Pressure(mmHg): 160/87 Temperature(F): 97.2 Respiratory Rate(breaths/min): 16 [1:Photos:] [N/A:N/A] Left, Medial Foot N/A N/A Wound Location: Gradually Appeared N/A N/A Wounding Event: Diabetic Wound/Ulcer of the Lower N/A N/A Primary Etiology: Extremity Cataracts, Coronary Artery Disease, N/A N/A Comorbid History: Hypertension, Myocardial Infarction, Peripheral Arterial Disease, Type II Diabetes, Osteomyelitis, Neuropathy 02/22/2021 N/A N/A Date Acquired: 30 N/A N/A Weeks of Treatment: Open N/A N/A Wound Status: No N/A N/A Wound Recurrence: 4.5x1.7x0.1 N/A N/A Measurements L x W x D (cm) 6.008 N/A N/A A (cm) : rea 0.601 N/A N/A Volume (cm) :  57.50% N/A N/A % Reduction in A rea: 95.70% N/A N/A % Reduction in Volume: Grade 3 N/A N/A Classification: Medium N/A N/A Exudate A mount: Serosanguineous N/A N/A Exudate Type: red, brown N/A N/A Exudate Color: Distinct, outline attached N/A N/A Wound Margin: Large (67-100%) N/A N/A Granulation A mount: Red, Hyper-granulation N/A N/A Granulation Quality: Small (1-33%) N/A N/A Necrotic A mount: Fat Layer (Subcutaneous Tissue): Yes N/A N/A Exposed Structures: Fascia: No Tendon: No Muscle: No Joint: No Bone: No Small (1-33%) N/A N/A Epithelialization: Excoriation: No N/A N/A Periwound Skin Texture: Induration: No Callus: No Crepitus: No Rash: No Scarring: No Maceration: Yes N/A N/A Periwound Skin Moisture: Dry/Scaly: Yes No Abnormalities Noted N/A N/A Periwound Skin Color: No Abnormality N/A N/A Temperature: Chemical Cauterization N/A N/A Procedures Performed: Treatment Notes Electronic Signature(s) Signed: 03/25/2022 4:55:30 PM By: Samuella Bruin Signed: 03/27/2022 7:19:32 PM By: Baltazar Najjar MD Entered By: Baltazar Najjar on 03/25/2022 12:22:10 -------------------------------------------------------------------------------- Multi-Disciplinary Care Plan  Details Patient Name: Date of Service: [name] A. 03/25/2022 10:45 A M Medical Record Number: [id number] Patient Account Number: [id number] Date of Birth/Sex: Treating RN: Jan 30, 1956 (66 y.o. [name] Primary Care Gini Caputo: [name] Other Clinician: Referring Emylie Amster: Treating Wilhelmenia Addis/Extender: Phil Dopp in Treatment: 30 Multidisciplinary Care Plan reviewed with physician Active Inactive Nutrition Nursing Diagnoses: Impaired glucose control: actual or potential Goals: NICHOLAD, KAUTZMAN A ([id number]) [id number] Page 4 of 7 Patient/caregiver verbalizes understanding of need to maintain therapeutic glucose control per primary care physician Date Initiated: 08/25/2021 Target Resolution Date: 04/08/2022 Goal Status: Active Interventions: Assess HgA1c results as ordered upon admission and as needed Provide education on elevated blood sugars and impact on wound healing Notes: Osteomyelitis Nursing Diagnoses: Infection: osteomyelitis Knowledge deficit related to disease process and management Goals: Patient's osteomyelitis will resolve Date Initiated: 09/17/2021 Target Resolution Date: 04/08/2022 Goal Status: Active Interventions: Assess for signs and symptoms of osteomyelitis resolution every visit Provide education on osteomyelitis Treatment Activities: Surgical debridement : 09/17/2021 Systemic antibiotics : 09/17/2021 T ordered outside of clinic : 09/17/2021 est Notes: Electronic Signature(s) Signed: 03/25/2022 4:55:30 PM By: Samuella Bruin Entered By: Samuella Bruin on 03/25/2022 12:44:28 -------------------------------------------------------------------------------- Pain Assessment Details Patient Name: Date of Service: [name] A. 03/25/2022 10:45 A M Medical Record Number: [id number] Patient Account Number: [id number] Date of Birth/Sex: Treating RN: [birth year] (66 y.o. [name] Primary Care Patsy Zaragoza: [name] Other Clinician: Referring Aldous Housel: Treating Orpah Hausner/Extender: Phil Dopp in Treatment: 30 Active Problems Location of Pain Severity and Description of Pain Patient Has Paino No Site Locations Rate the pain. Current Pain Level: 0 NYMIR, RINGLER A ([id number]) [id number] Page 5 of 7 Pain Management and Medication Current Pain Management: Electronic Signature(s) Signed: 03/25/2022 4:55:30 PM By: Samuella Bruin Entered By: Samuella Bruin on 03/25/2022 11:30:44 -------------------------------------------------------------------------------- Patient/Caregiver Education Details Patient Name: Date of Service: NA [name] 10/13/2023andnbsp10:45 A M Medical Record Number: [id number] Patient Account Number: [id number] Date of Birth/Gender: Treating RN: 1956-03-15 (66 y.o. [name] Primary Care Physician: [name] Other Clinician: Referring Physician: Treating Physician/Extender: Phil Dopp in Treatment: 30 Education Assessment Education Provided To: Patient Education Topics Provided Wound/Skin Impairment: Methods: Explain/Verbal Responses: Reinforcements needed, State content correctly Electronic Signature(s) Signed: 03/25/2022 4:55:30 PM By: Samuella Bruin Entered By: Samuella Bruin on 03/25/2022 12:44:38 -------------------------------------------------------------------------------- Wound Assessment Details Patient Name: Date of Service: NA Colin Nephew A. 03/25/2022 10:45 A M Medical Record Number: [id number] Patient Account Number:  [id number] Date of Birth/Sex: Treating RN: [birth year] (66 y.o. [name] Primary Care Fariha Goto: [name] Other Clinician: Referring Eloina Ergle: Treating Britne Borelli/Extender: Albertine Patricia Weeks in Treatment: 30 Wound Status Wound Number: 1 Primary Diabetic Wound/Ulcer of  the Lower Extremity Etiology: Wound Location: Left, Medial Foot Wound Open Wounding Event: Gradually Appeared Status: Date Acquired: 02/22/2021 Comorbid Cataracts, Coronary Artery Disease, Hypertension, Myocardial Weeks Of Treatment: 30 History: Infarction, Peripheral Arterial Disease, Type II Diabetes, Clustered Wound: No Osteomyelitis, Neuropathy Photos ZENON, LEAF A ([id number]) [id number] Page 6 of 7 Wound Measurements Length: (cm) 4.5 Width: (cm) 1.7 Depth: (cm) 0.1 Area: (cm) 6.008 Volume: (cm) 0.601 % Reduction in Area: 57.5% % Reduction in Volume: 95.7% Epithelialization: Small (1-33%) Tunneling: No Undermining: No Wound Description Classification: Grade 3 Wound Margin: Distinct, outline attached Exudate Amount: Medium Exudate Type: Serosanguineous Exudate Color: red, brown Foul Odor After Cleansing: No Slough/Fibrino Yes Wound Bed Granulation Amount: Large (67-100%) Exposed Structure Granulation Quality: Red, Hyper-granulation Fascia Exposed: No Necrotic Amount: Small (1-33%) Fat Layer (Subcutaneous Tissue) Exposed: Yes Necrotic Quality: Adherent Slough Tendon Exposed: No Muscle Exposed: No Joint Exposed: No Bone Exposed: No Periwound Skin Texture Texture Color No Abnormalities Noted: Yes No Abnormalities Noted: Yes Moisture Temperature / Pain No Abnormalities Noted: No Temperature: No Abnormality Dry / Scaly: Yes Maceration: Yes Treatment Notes Wound #1 (Foot) Wound Laterality: Left, Medial Cleanser Soap and Water Discharge Instruction: May shower and wash wound with dial antibacterial soap and water prior to dressing change. Wound Cleanser Discharge Instruction: Cleanse the wound with wound cleanser prior to applying a clean dressing using gauze sponges, not tissue or cotton balls. Peri-Wound Care Sween Lotion (Moisturizing lotion) Discharge Instruction: Apply moisturizing lotion as  directed Topical Gentamicin Discharge Instruction: As directed by physician Primary Dressing Hydrofera Blue Classic Foam, 4x4 in Discharge Instruction: Moisten with saline prior to applying to wound bed Secondary Dressing ABD Pad, 5x9 Discharge Instruction: Apply over primary dressing as directed. Woven Gauze Sponge, Non-Sterile 4x4 in Discharge Instruction: Apply over primary dressing as directed. JLEN, WINTLE A ([id number]) [id number] Page 7 of 7 Secured With Transpore Surgical Tape, 2x10 (in/yd) Discharge Instruction: Secure dressing with tape as directed. Compression Wrap Kerlix Roll 4.5x3.1 (in/yd) Discharge Instruction: Apply Kerlix and Coban compression as directed. Coban Self-Adherent Wrap 4x5 (in/yd) Discharge Instruction: Apply over Kerlix as directed. Compression Stockings Add-Ons Electronic Signature(s) Signed: 03/25/2022 4:55:30 PM By: Samuella Bruin Entered By: Samuella Bruin on 03/25/2022 11:38:58 -------------------------------------------------------------------------------- Vitals Details Patient Name: Date of Service: NA [name] 03/25/2022 10:45 A M Medical Record Number: [id number] Patient Account Number: [id number] Date of Birth/Sex: Treating RN: [birth year] (66 y.o. [name] Primary Care Kensli Bowley: [name] Other Clinician: Referring Aymara Sassi: Treating Velina Drollinger/Extender: Phil Dopp in Treatment: 30 Vital Signs Time Taken: 10:31 Temperature (F): 97.2 Height (in): 74 Pulse (bpm): 70 Weight (lbs): 186 Respiratory Rate (breaths/min): 16 Body Mass Index (BMI): 23.9 Blood Pressure (mmHg): 160/87 Capillary Blood Glucose (mg/dl): 387 Reference Range: 80 - 120 mg / dl Electronic Signature(s) Signed: 03/25/2022 4:55:30 PM By: Samuella Bruin Entered By: Samuella Bruin on 03/25/2022 11:30:38

## 2022-03-28 NOTE — Progress Notes (Signed)
RAYNIER, MUSOLINO A ([id number]) [id number] Page 1 of 8 Visit Report for 03/25/2022 HPI Details Patient Name: Date of Service: NA [name] 03/25/2022 10:45 A M Medical Record Number: [id number] Patient Account Number: [id number] Date of Birth/Sex: Treating RN: [birth year] (66 y.o. [name] Primary Care Provider: [name] Other Clinician: Referring Provider: Treating Provider/Extender: Winfred Leeds in Treatment: 30 History of Present Illness HPI Description: ADMISSION 08/25/2021 This is a 66 year old man who initially presented to his primary care provider in September 2022 with pain in his left foot. He was sent for an x-ray and while the x-ray was being performed, the tech pointed out a wound on his foot that the patient was not aware existed. He does have type 2 diabetes with significant neuropathy. His diabetes is suboptimally controlled with his most recent A1c being 8.5. He also has a history of coronary artery disease status post three- vessel CABG. he was initially seen by orthopedics, but they referred him to [facility]. He has undergone at least 7 operations/debridements and several applications of skin substitute under the care of podiatry. He has been in a wound VAC for much of this time. His most recent procedure was July 28, 2021. A portion of the talus was biopsied and was found to be consistent with osteomyelitis. Culture also returned positive for corynebacterium. He was seen on August 16, 2021 by infectious disease. A PICC line has been placed and he will be receiving a 6-week course of IV daptomycin and cefepime. In October 2022, he underwent lower extremity vascular studies. Results are copied here: Right: Resting right ankle-brachial index is within normal range. No evidence of significant right lower extremity arterial disease. The right toe-brachial index is abnormal. Left: Resting  left ankle-brachial index indicates mild left lower extremity arterial disease. The left toe-brachial index is abnormal. He has not been seen by vascular surgery despite these findings. He presented to clinic today in a cam boot and is using a knee scooter to offload. Wound VAC was in place. Once this was removed, a large ulcer was identified on the left midfoot/ankle. Bone is frankly exposed. There is no malodorous or purulent drainage. There is some granulation tissue over the central portion of the exposed bone. There is a tunnel that extends posteriorly for roughly 10 cm. It has been discussed with him by multiple providers that he is at very high risk of losing his lower leg because of this wound. He is extremely eager to avoid this outcome and is here today to review his options as well as receive ongoing wound care. 09/03/2021: Here for reevaluation of his wound. There does not appear to have been any substantial improvement overall since our last visit. He has been in a wound VAC with white foam overlying the exposed bone. We are working on getting him approved for hyperbaric oxygen therapy. 09/10/2021: We are in the process of getting him cleared to begin hyperbaric oxygen therapy. He still needs to obtain a chest x-ray. Although the wound measurements are roughly the same, I think the overall appearance of the wound is better. The exposed bone has a bit more granulation tissue covering it. He has not received a vascular surgery appointment to reevaluate his flow to the wound. 09/17/2021: He has been approved for hyperbaric oxygen therapy and completed his chest x-ray, which I reviewed and it appears normal. The tunnels at the 12 and 10:00 positions are smaller. There is more granulation  tissue covering the exposed bone and the undermining has decreased. He still has not received a vascular surgery appointment. 09/24/2021: He initiated hyperbaric oxygen therapy this week and is tolerating it  well. He has an appointment with vascular surgery coming up on May 16. The granulation tissue is covering more of the exposed bone and both tunnels are a bit smaller. 10/01/2021: He continues to tolerate hyperbaric oxygen therapy. He saw infectious disease and they are planning to pull his PICC line. He has been initiated on oral antibiotics (doxycycline and Augmentin). The wound looks about the same but the tunnels are a little bit smaller. The skin seems to be contracting somewhat around the exposed bone. 10/08/2021: The wound is still about the same size, but the tunnels continue to come in and the skin is contracting around the exposed bone. He continues to have some accumulation of necrotic material in the inferoposterior aspect of the wound as well as accumulation at the 12:00 tunnel area. 10/15/2021: The wound is smaller today. The tunnels continue to come in. There is less necrotic tissue present. He does have some periwound maceration. 10/22/2021: The wound is about the same size. There is a little bit less undermining at the distal portion. The exposed bone is dark and I am not sure if this is staining from silver nitrate or his VAC sponge or if it represents necrosis. The tunnels are shallower but he does have some serous drainage coming from the 10:00 tunnel. He continues to tolerate hyperbaric oxygen therapy well. 10/29/2021: The undermining continues to improve. The tunnels are about the same. He has good granulation tissue overlying the majority of the exposed bone. It does appear that perhaps the tubing from his wound VAC has been eroding the skin at the 12 clock position. He continues to accumulate senescent epithelium around the borders of the wound. 11/05/2021: The undermining is almost completely resolved. The tunnels have contracted fairly significantly. No significant slough or debris accumulation. There is still senescent epithelium accumulation around the borders of the wound. He has  been tolerating hyperbaric oxygen therapy well. 11/12/2021: Despite the measurements of the wound being about the same, the wound has changed in its shape and overall, I think it is improved. The undermining has resolved and the tunnels continue to shorten. There is good granulation tissue encroaching over the small area of bone that has remained exposed at the 12 o'clock position. Minimal slough accumulation. He continues to tolerate hyperbaric oxygen therapy well. KORI, MCKEE A ([id number]) [id number] Page 2 of 8 11/19/2021: I took a PCR culture last week. There was overgrowth of yeast. He is already taking suppressive doxycycline and Augmentin. I added fluconazole to his regimen. The wound is smaller again today. The tunnels continue to shorten. He continues to do well with hyperbaric oxygen therapy. 11/26/2021: For some reason, his foot has become macerated. The wound is narrower but about the same dimensions in its longitudinal aspect. The tunnels continue to shorten. He has some slough buildup on the wound as well as some heaped up senescent epithelium around the perimeter. 12/03/2021: No further maceration of his foot has occurred. The wound has contracted quite significantly from last week. The tunnel at 10:00 is closed. The tunnel at 12:00 is down to just a couple of millimeters. No other undermining is present. There is soft tissue coverage of the previously exposed bone. There is just a bit of slough and biofilm on the wound surface. 12/10/2021: The wound is looking good. It turns out  the tunnel at 12:00 is only exposed when the patient dorsiflexes his foot. It is about 2 cm in depth when he does this; when his foot is in plantarflexion, the tunnel is closed. The bone that was visible at the 12:00 tunnel is completely covered with granulation tissue, but there does feel like some exposed bone deeper into the tunnel area. There is senescent skin heaped up around the  periphery. Minimal slough on the wound surface. 12/16/2021: The wound dimensions are roughly the same. The surface has nice granulation tissue. The exposed bone at the 12:00 tunnel continues to be covered with more soft tissue. 7/14; patient's wound measures smaller today. Using the wound VAC with underlying collagen. He is also being treated with HBO for underlying osteomyelitis. He tells me he is on doxycycline and ampicillin follows with infectious disease next week 12/31/2021: The wound continues to contract. Unfortunately, the area where the track pad and tubing have been rubbing continues to look like it is applying friction. He says that the home health nurses that have been applying the [facility] have been putting gauze underneath the tubing, but nonetheless there is ongoing tissue breakdown at this site. Light slough accumulation on the wound surface. The tunnel continues to contract. He is tolerating HBO without difficulty. 01/07/2022: Bridging the wound VAC away from the ankle has resulted in significant improvement in the tissue at the apex of the wound. The tunnel is still present and is not all that much shorter, but the overall wound surface is very robust and healthy looking. Minimal slough accumulation. No concern for acute infection. 01/21/2022: The wound continues to contract and has a robust granulation tissue surface. The tunnel has come in considerably and is down to about 1.4 cm. There is still bone exposed within the tunnel but the rest of it is well covered. There is some senescent epithelium at the wound margins and a little bit of slough on the surface. 01/28/2022: No significant change in the wound this week, but there has not been any reaccumulation of senescent epithelium or slough. The tunnel is perhaps a millimeter less in depth. He has been approved for Apligraf and we will apply this today. 02/04/2022: The wound has contracted somewhat and the tunnel has filled in completely.  The wound surface is clean. He is here for Apligraf #2. 02/11/2022: The wound has contracted further and is now nearly flush with the surrounding skin surface. Light layer of slough. Apligraf #3 plan for today. 02/18/2022: There is a band of epithelium trying to cut across the superior portion of the wound. There is robust granulation tissue with just a light layer of slough and biofilm on the surface. There was a little bit of greenish drainage in the wound VAC but none appreciated on the site itself. He is here for Apligraf #4. 03/04/2022: The wound has contracted considerably. There is good granulation tissue on the surface. Minimal biofilm. He is here for Apligraf #5. 03/18/2022: The wound has epithelialized to the point that it has been divided into 2 areas. Both areas are smaller in total than at his previous visit. There is good granulation tissue present with just a little bit of periwound eschar and surface slough. 10/13; left medial foot. The patient has completed treatment with hyperbaric oxygen for underlying osteomyelitis he has had a nice response in the wound. Noted today that he still had some greenish drainage. Previously he has been treated with topical gentamicin but apparently that was stopped 2 weeks ago. Fortunately  the wound is measuring smaller Electronic Signature(s) Signed: 03/27/2022 7:19:32 PM By: Linton Ham MD Entered By: Linton Ham on 03/25/2022 12:23:15 -------------------------------------------------------------------------------- Chemical Cauterization Details Patient Name: Date of Service: [name] 03/25/2022 10:45 A M Medical Record Number: [id number] Patient Account Number: [id number] Date of Birth/Sex: Treating RN: [birth year] (66 y.o. [name] Primary Care Provider: [name] Other Clinician: Referring Provider: Treating Provider/Extender: Winfred Leeds in Treatment: 30 Procedure Performed for: Wound #1  Left,Medial Foot Performed By: Physician Ricard Dillon., MD Post Procedure Diagnosis Same as Pre-procedure Electronic Signature(s) Signed: 03/27/2022 7:19:32 PM By: Linton Ham MD Entered By: Linton Ham on 03/25/2022 12:22:16 [name] ([id number]) [id number] Page 3 of 8 -------------------------------------------------------------------------------- Physical Exam Details Patient Name: Date of Service: [name] 03/25/2022 10:45 A M Medical Record Number: [id number] Patient Account Number: [id number] Date of Birth/Sex: Treating RN: 07-30-1955 (66 y.o. [name] Primary Care Provider: [name] Other Clinician: Referring Provider: Treating Provider/Extender: Winfred Leeds in Treatment: 67 Constitutional Patient is hypertensive.. Pulse regular and within target range for patient.Marland Kitchen Respirations regular, non-labored and within target range.. Temperature is normal and within the target range for the patient.Marland Kitchen Appears in no distress. Notes Wound exam; left medial foot. 2 open areas. Both are hyper granulated slightly. I used silver nitrate for chemical cauterization. There is no evidence of infection Electronic Signature(s) Signed: 03/27/2022 7:19:32 PM By: Linton Ham MD Entered By: Linton Ham on 03/25/2022 12:24:58 -------------------------------------------------------------------------------- Physician Orders Details Patient Name: Date of Service: NA [name] 03/25/2022 10:45 A M Medical Record Number: [id number] Patient Account Number: [id number] Date of Birth/Sex: Treating RN: May 01, 1956 (66 y.o. [name] Primary Care Provider: [name] Other Clinician: Referring Provider: Treating Provider/Extender: Winfred Leeds in Treatment: 30 Verbal / Phone Orders: No Diagnosis Coding Follow-up Appointments ppointment in 1 week. - Dr. Celine Ahr - room  2 Return A Anesthetic Wound #1 Left,Medial Foot (In clinic) Topical Lidocaine 5% applied to wound bed Bathing/ Shower/ Hygiene May shower with protection but do not get wound dressing(s) wet. Edema Control - Lymphedema / SCD / Other Elevate legs to the level of the heart or above for 30 minutes daily and/or when sitting, a frequency of: Avoid standing for long periods of time. Wound Treatment Wound #1 - Foot Wound Laterality: Left, Medial Cleanser: Soap and Water 1 x Per Week/30 Days Discharge Instructions: May shower and wash wound with dial antibacterial soap and water prior to dressing change. Cleanser: Wound Cleanser 1 x Per Week/30 Days Discharge Instructions: Cleanse the wound with wound cleanser prior to applying a clean dressing using gauze sponges, not tissue or cotton balls. Peri-Wound Care: Sween Lotion (Moisturizing lotion) 1 x Per Week/30 Days Discharge Instructions: Apply moisturizing lotion as directed SRUJAN, VALERA A ([id number]) [phone].pdf Page 4 of 8 Topical: Gentamicin 1 x Per Week/30 Days Discharge Instructions: As directed by physician Prim Dressing: Hydrofera Blue Classic Foam, 4x4 in 1 x Per Week/30 Days ary Discharge Instructions: Moisten with saline prior to applying to wound bed Secondary Dressing: ABD Pad, 5x9 1 x Per Week/30 Days Discharge Instructions: Apply over primary dressing as directed. Secondary Dressing: Woven Gauze Sponge, Non-Sterile 4x4 in 1 x Per Week/30 Days Discharge Instructions: Apply over primary dressing as directed. Secured With: Transpore Surgical Tape, 2x10 (in/yd) 1 x Per Week/30 Days Discharge Instructions: Secure dressing with tape as directed. Compression Wrap: Kerlix Roll 4.5x3.1 (in/yd) 1  x Per Week/30 Days Discharge Instructions: Apply Kerlix and Coban compression as directed. Compression Wrap: Coban Self-Adherent Wrap 4x5 (in/yd) 1 x Per Week/30 Days Discharge Instructions: Apply over Kerlix as  directed. Patient Medications llergies: codeine A Notifications Medication Indication Start End 03/25/2022 lidocaine DOSE topical 5 % ointment - ointment topical Electronic Signature(s) Signed: 03/25/2022 4:55:30 PM By: Adline Peals Signed: 03/27/2022 7:19:32 PM By: Linton Ham MD Entered By: Adline Peals on 03/25/2022 11:50:36 -------------------------------------------------------------------------------- Problem List Details Patient Name: Date of Service: NA [name] 03/25/2022 10:45 A M Medical Record Number: [id number] Patient Account Number: [id number] Date of Birth/Sex: Treating RN: 16-Jun-1955 (66 y.o. 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 09/21/2021 No Yes M86.672 Other chronic osteomyelitis, left ankle and foot 08/25/2021 No Yes E11.610 Type 2 diabetes mellitus with diabetic neuropathic arthropathy 08/25/2021 No Yes E11.621 Type 2 diabetes mellitus with foot ulcer 08/25/2021 No Yes I25.10 Atherosclerotic heart disease of native coronary artery without angina pectoris 08/25/2021 No Yes THADIS, BRODIE A ([id number]) [id number] Page 5 of 8 Inactive Problems Resolved Problems Electronic Signature(s) Signed: 03/27/2022 7:19:32 PM By: Linton Ham MD Entered By: Linton Ham on 03/25/2022 12:22:03 -------------------------------------------------------------------------------- Progress Note Details Patient Name: Date of Service: NA [name] 03/25/2022 10:45 A M Medical Record Number: [id number] Patient Account Number: [id number] Date of Birth/Sex: Treating RN: [birth year] (66 y.o. [name] Primary Care Provider: [name]  Other Clinician: Referring Provider: Treating Provider/Extender: Winfred Leeds in Treatment: 30 Subjective History of Present Illness (HPI) ADMISSION 08/25/2021 This is a 66 year old man who initially presented to his primary care provider in September 2022 with pain in his left foot. He was sent for an x-ray and while the x-ray was being performed, the tech pointed out a wound on his foot that the patient was not aware existed. He does have type 2 diabetes with significant neuropathy. His diabetes is suboptimally controlled with his most recent A1c being 8.5. He also has a history of coronary artery disease status post three- vessel CABG. he was initially seen by orthopedics, but they referred him to [facility]. He has undergone at least 7 operations/debridements and several applications of skin substitute under the care of podiatry. He has been in a wound VAC for much of this time. His most recent procedure was July 28, 2021. A portion of the talus was biopsied and was found to be consistent with osteomyelitis. Culture also returned positive for corynebacterium. He was seen on August 16, 2021 by infectious disease. A PICC line has been placed and he will be receiving a 6-week course of IV daptomycin and cefepime. In October 2022, he underwent lower extremity vascular studies. Results are copied here: Right: Resting right ankle-brachial index is within normal range. No evidence of significant right lower extremity arterial disease. The right toe-brachial index is abnormal. Left: Resting left ankle-brachial index indicates mild left lower extremity arterial disease. The left toe-brachial index is abnormal. He has not been seen by vascular surgery despite these findings. He presented to clinic today in a cam boot and is using a knee scooter to offload. Wound VAC was in place. Once this was removed, a large ulcer was identified on the left midfoot/ankle.  Bone is frankly exposed. There is  no malodorous or purulent drainage. There is some granulation tissue over the central portion of the exposed bone. There is a tunnel that extends posteriorly for roughly 10 cm. It has been discussed with him by multiple providers that he is at very high risk of losing his lower leg because of this wound. He is extremely eager to avoid this outcome and is here today to review his options as well as receive ongoing wound care. 09/03/2021: Here for reevaluation of his wound. There does not appear to have been any substantial improvement overall since our last visit. He has been in a wound VAC with white foam overlying the exposed bone. We are working on getting him approved for hyperbaric oxygen therapy. 09/10/2021: We are in the process of getting him cleared to begin hyperbaric oxygen therapy. He still needs to obtain a chest x-ray. Although the wound measurements are roughly the same, I think the overall appearance of the wound is better. The exposed bone has a bit more granulation tissue covering it. He has not received a vascular surgery appointment to reevaluate his flow to the wound. 09/17/2021: He has been approved for hyperbaric oxygen therapy and completed his chest x-ray, which I reviewed and it appears normal. The tunnels at the 12 and 10:00 positions are smaller. There is more granulation tissue covering the exposed bone and the undermining has decreased. He still has not received a vascular surgery appointment. 09/24/2021: He initiated hyperbaric oxygen therapy this week and is tolerating it well. He has an appointment with vascular surgery coming up on May 16. The granulation tissue is covering more of the exposed bone and both tunnels are a bit smaller. 10/01/2021: He continues to tolerate hyperbaric oxygen therapy. He saw infectious disease and they are planning to pull his PICC line. He has been initiated on oral antibiotics (doxycycline and Augmentin). The  wound looks about the same but the tunnels are a little bit smaller. The skin seems to be contracting somewhat around the exposed bone. 10/08/2021: The wound is still about the same size, but the tunnels continue to come in and the skin is contracting around the exposed bone. He continues to have some accumulation of necrotic material in the inferoposterior aspect of the wound as well as accumulation at the 12:00 tunnel area. 10/15/2021: The wound is smaller today. The tunnels continue to come in. There is less necrotic tissue present. He does have some periwound maceration. 10/22/2021: The wound is about the same size. There is a little bit less undermining at the distal portion. The exposed bone is dark and I am not sure if this is RUTGER, FEEST A ([id number]) [id number] Page 6 of 8 staining from silver nitrate or his VAC sponge or if it represents necrosis. The tunnels are shallower but he does have some serous drainage coming from the 10:00 tunnel. He continues to tolerate hyperbaric oxygen therapy well. 10/29/2021: The undermining continues to improve. The tunnels are about the same. He has good granulation tissue overlying the majority of the exposed bone. It does appear that perhaps the tubing from his wound VAC has been eroding the skin at the 12 clock position. He continues to accumulate senescent epithelium around the borders of the wound. 11/05/2021: The undermining is almost completely resolved. The tunnels have contracted fairly significantly. No significant slough or debris accumulation. There is still senescent epithelium accumulation around the borders of the wound. He has been tolerating hyperbaric oxygen therapy well. 11/12/2021: Despite the measurements of the wound being  about the same, the wound has changed in its shape and overall, I think it is improved. The undermining has resolved and the tunnels continue to shorten. There is good granulation tissue  encroaching over the small area of bone that has remained exposed at the 12 o'clock position. Minimal slough accumulation. He continues to tolerate hyperbaric oxygen therapy well. 11/19/2021: I took a PCR culture last week. There was overgrowth of yeast. He is already taking suppressive doxycycline and Augmentin. I added fluconazole to his regimen. The wound is smaller again today. The tunnels continue to shorten. He continues to do well with hyperbaric oxygen therapy. 11/26/2021: For some reason, his foot has become macerated. The wound is narrower but about the same dimensions in its longitudinal aspect. The tunnels continue to shorten. He has some slough buildup on the wound as well as some heaped up senescent epithelium around the perimeter. 12/03/2021: No further maceration of his foot has occurred. The wound has contracted quite significantly from last week. The tunnel at 10:00 is closed. The tunnel at 12:00 is down to just a couple of millimeters. No other undermining is present. There is soft tissue coverage of the previously exposed bone. There is just a bit of slough and biofilm on the wound surface. 12/10/2021: The wound is looking good. It turns out the tunnel at 12:00 is only exposed when the patient dorsiflexes his foot. It is about 2 cm in depth when he does this; when his foot is in plantarflexion, the tunnel is closed. The bone that was visible at the 12:00 tunnel is completely covered with granulation tissue, but there does feel like some exposed bone deeper into the tunnel area. There is senescent skin heaped up around the periphery. Minimal slough on the wound surface. 12/16/2021: The wound dimensions are roughly the same. The surface has nice granulation tissue. The exposed bone at the 12:00 tunnel continues to be covered with more soft tissue. 7/14; patient's wound measures smaller today. Using the wound VAC with underlying collagen. He is also being treated with HBO for underlying  osteomyelitis. He tells me he is on doxycycline and ampicillin follows with infectious disease next week 12/31/2021: The wound continues to contract. Unfortunately, the area where the track pad and tubing have been rubbing continues to look like it is applying friction. He says that the home health nurses that have been applying the [facility] have been putting gauze underneath the tubing, but nonetheless there is ongoing tissue breakdown at this site. Light slough accumulation on the wound surface. The tunnel continues to contract. He is tolerating HBO without difficulty. 01/07/2022: Bridging the wound VAC away from the ankle has resulted in significant improvement in the tissue at the apex of the wound. The tunnel is still present and is not all that much shorter, but the overall wound surface is very robust and healthy looking. Minimal slough accumulation. No concern for acute infection. 01/21/2022: The wound continues to contract and has a robust granulation tissue surface. The tunnel has come in considerably and is down to about 1.4 cm. There is still bone exposed within the tunnel but the rest of it is well covered. There is some senescent epithelium at the wound margins and a little bit of slough on the surface. 01/28/2022: No significant change in the wound this week, but there has not been any reaccumulation of senescent epithelium or slough. The tunnel is perhaps a millimeter less in depth. He has been approved for Apligraf and we will apply this  today. 02/04/2022: The wound has contracted somewhat and the tunnel has filled in completely. The wound surface is clean. He is here for Apligraf #2. 02/11/2022: The wound has contracted further and is now nearly flush with the surrounding skin surface. Light layer of slough. Apligraf #3 plan for today. 02/18/2022: There is a band of epithelium trying to cut across the superior portion of the wound. There is robust granulation tissue with just a light layer of  slough and biofilm on the surface. There was a little bit of greenish drainage in the wound VAC but none appreciated on the site itself. He is here for Apligraf #4. 03/04/2022: The wound has contracted considerably. There is good granulation tissue on the surface. Minimal biofilm. He is here for Apligraf #5. 03/18/2022: The wound has epithelialized to the point that it has been divided into 2 areas. Both areas are smaller in total than at his previous visit. There is good granulation tissue present with just a little bit of periwound eschar and surface slough. 10/13; left medial foot. The patient has completed treatment with hyperbaric oxygen for underlying osteomyelitis he has had a nice response in the wound. Noted today that he still had some greenish drainage. Previously he has been treated with topical gentamicin but apparently that was stopped 2 weeks ago. Fortunately the wound is measuring smaller Objective Constitutional Patient is hypertensive.. Pulse regular and within target range for patient.Marland Kitchen Respirations regular, non-labored and within target range.. Temperature is normal and within the target range for the patient.Marland Kitchen Appears in no distress. Vitals Time Taken: 10:31 AM, Height: 74 in, Weight: 186 lbs, BMI: 23.9, Temperature: 97.2 F, Pulse: 70 bpm, Respiratory Rate: 16 breaths/min, Blood Pressure: 160/87 mmHg, Capillary Blood Glucose: 210 mg/dl. General Notes: Wound exam; left medial foot. 2 open areas. Both are hyper granulated slightly. I used silver nitrate for chemical cauterization. There is no evidence of infection Integumentary (Hair, Skin) Wound #1 status is Open. Original cause of wound was Gradually Appeared. The date acquired was: 02/22/2021. The wound has been in treatment 30 weeks. The wound is located on the Left,Medial Foot. The wound measures 4.5cm length x 1.7cm width x 0.1cm depth; 6.008cm^2 area and 0.601cm^3 volume. There is Fat Layer (Subcutaneous Tissue) exposed.  There is no tunneling or undermining noted. There is a medium amount of serosanguineous drainage noted. The EDMON, MAGID A ([id number]) [id number] Page 7 of 8 wound margin is distinct with the outline attached to the wound base. There is large (67-100%) red, hyper - granulation within the wound bed. There is a small (1- 33%) amount of necrotic tissue within the wound bed including Adherent Slough. The periwound skin appearance had no abnormalities noted for texture. The periwound skin appearance had no abnormalities noted for color. The periwound skin appearance exhibited: Dry/Scaly, Maceration. Periwound temperature was noted as No Abnormality. 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 An Chemical Cauterization procedure was performed by Ricard Dillon., MD. Post procedure Diagnosis Wound #1: Same as Pre-Procedure Plan Follow-up Appointments: Return Appointment in 1 week. - Dr. Celine Ahr - room 2 Anesthetic: Wound #1 Left,Medial Foot: (In clinic) Topical Lidocaine 5% applied to  wound bed Bathing/ Shower/ Hygiene: May shower with protection but do not get wound dressing(s) wet. Edema Control - Lymphedema / SCD / Other: Elevate legs to the level of the heart or above for 30 minutes daily and/or when sitting, a frequency of: Avoid standing for long periods of time. The following medication(s) was prescribed: lidocaine topical 5 % ointment ointment topical was prescribed at facility WOUND #1: - Foot Wound Laterality: Left, Medial Cleanser:  Soap and Water 1 x Per Week/30 Days Discharge Instructions: May shower and wash wound with dial antibacterial soap and water prior to dressing change. Cleanser: Wound Cleanser 1 x Per Week/30 Days Discharge Instructions: Cleanse the wound with wound cleanser prior to applying a clean dressing using gauze sponges, not tissue or cotton balls. Peri-Wound Care: Sween Lotion (Moisturizing lotion) 1 x Per Week/30 Days Discharge Instructions: Apply moisturizing lotion as directed Topical: Gentamicin 1 x Per Week/30 Days Discharge Instructions: As directed by physician Prim Dressing: Hydrofera Blue Classic Foam, 4x4 in 1 x Per Week/30 Days ary Discharge Instructions: Moisten with saline prior to applying to wound bed Secondary Dressing: ABD Pad, 5x9 1 x Per Week/30 Days Discharge Instructions: Apply over primary dressing as directed. Secondary Dressing: Woven Gauze Sponge, Non-Sterile 4x4 in 1 x Per Week/30 Days Discharge Instructions: Apply over primary dressing as directed. Secured With: Transpore Surgical T ape, 2x10 (in/yd) 1 x Per Week/30 Days Discharge Instructions: Secure dressing with tape as directed. Com pression Wrap: Kerlix Roll 4.5x3.1 (in/yd) 1 x Per Week/30 Days Discharge Instructions: Apply Kerlix and Coban compression as directed. Com pression Wrap: Coban Self-Adherent Wrap 4x5 (in/yd) 1 x Per Week/30 Days Discharge Instructions: Apply over Kerlix as directed. ` 1. Chemical cauterization. T help with the hypergranulation I have changed him to [facility] because of the drainage slight amount of gentamicin under o the Hydrofera Blue still under the same compression 2. He has completed HBO with good effect Electronic Signature(s) Signed: 03/27/2022 7:19:32 PM By: Linton Ham MD Entered By: Linton Ham on 03/25/2022 12:27:46 [name] ([id number]) [id number] Page 8 of  8 -------------------------------------------------------------------------------- SuperBill Details Patient Name: Date of Service: NA [name] 03/25/2022 Medical Record Number: [id number] Patient Account Number: [id number] Date of Birth/Sex: Treating RN: 1955/10/11 (67 y.o. 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 M86.672 Other chronic osteomyelitis, left ankle and foot E11.610 Type 2 diabetes mellitus with diabetic neuropathic arthropathy E11.621 Type 2 diabetes mellitus with foot ulcer I25.10 Atherosclerotic heart disease of native coronary artery without angina pectoris Facility Procedures : CPT4 Code: [id number] Description: [id number] - CHEM CAUT GRANULATION TISS ICD-10 Diagnosis Description L97.324 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

## 2022-03-28 NOTE — Progress Notes (Signed)
THAYDEN, LEMIRE [name] ([id number]) [id number] Page 1 of 2 Visit Report for 03/25/2022 Problem List Details Patient Name: Date of Service: NA Colin Philips [name]. 03/25/2022 8:00 [name] Medical Record Number: [id number] Patient Account Number: [id number] Date of Birth/Sex: Treating RN: 04-25-1956 (66 y.o. 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 09/21/2021 No Yes M86.672 Other chronic osteomyelitis, left ankle and foot 08/25/2021 No Yes E11.610 Type 2 diabetes mellitus with diabetic neuropathic arthropathy 08/25/2021 No Yes E11.621 Type 2 diabetes mellitus with foot ulcer 08/25/2021 No Yes I25.10 Atherosclerotic heart disease of native coronary artery without 08/25/2021 No Yes angina pectoris Inactive Problems Resolved Problems Electronic Signature(s) Signed: 03/25/2022 2:40:57 PM By: [name] EMT Signed: 03/27/2022 7:19:32 PM By: Linton Ham MD Entered By: [name] on 03/25/2022 14:40:57 -------------------------------------------------------------------------------- SuperBill Details Patient Name: Date of Service: NA [name] 03/25/2022 Medical Record Number: [id number] Patient Account Number: [id number] Date of Birth/Sex: Treating RN: 27-Apr-1956 (66 y.o. 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 M86.672 Other chronic  osteomyelitis, left ankle and foot E11.610 Type 2 diabetes mellitus with diabetic neuropathic arthropathy E11.621 Type 2 diabetes mellitus with foot ulcer I25.10 Atherosclerotic heart disease of native coronary artery without angina pectoris Facility Procedures : CPT4 Code: [id number] Description: G0277-(Facility Use Only) HBOT full body chamber, 49min , ICD-10 Diagnosis Description E11.621 Type 2 diabetes mellitus with foot ulcer L97.324 Non-pressure chronic ulcer of left ankle with necrosis of M86.672 Other chronic osteomyelitis,  left ankle and foot E11.610 Type 2 diabetes mellitus with diabetic neuropathic arthrop Modifier: bone athy Quantity: 4 Physician Procedures : CPT4 Code Description Modifier [id number] - WC PHYS HYPERBARIC OXYGEN THERAPY ICD-10 Diagnosis Description E11.621 Type 2 diabetes mellitus with foot ulcer L97.324 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

## 2022-04-01 ENCOUNTER — Ambulatory Visit: Payer: Self-pay | Admitting: Licensed Clinical Social Worker

## 2022-04-01 ENCOUNTER — Encounter (HOSPITAL_BASED_OUTPATIENT_CLINIC_OR_DEPARTMENT_OTHER): Payer: BC Managed Care – PPO | Admitting: Internal Medicine

## 2022-04-01 DIAGNOSIS — E1140 Type 2 diabetes mellitus with diabetic neuropathy, unspecified: Secondary | ICD-10-CM | POA: Diagnosis not present

## 2022-04-01 DIAGNOSIS — L97324 Non-pressure chronic ulcer of left ankle with necrosis of bone: Secondary | ICD-10-CM | POA: Diagnosis not present

## 2022-04-01 DIAGNOSIS — E11610 Type 2 diabetes mellitus with diabetic neuropathic arthropathy: Secondary | ICD-10-CM | POA: Diagnosis not present

## 2022-04-01 DIAGNOSIS — E1169 Type 2 diabetes mellitus with other specified complication: Secondary | ICD-10-CM | POA: Diagnosis not present

## 2022-04-01 DIAGNOSIS — I2510 Atherosclerotic heart disease of native coronary artery without angina pectoris: Secondary | ICD-10-CM | POA: Diagnosis not present

## 2022-04-01 DIAGNOSIS — L97522 Non-pressure chronic ulcer of other part of left foot with fat layer exposed: Secondary | ICD-10-CM | POA: Diagnosis not present

## 2022-04-01 DIAGNOSIS — Z951 Presence of aortocoronary bypass graft: Secondary | ICD-10-CM | POA: Diagnosis not present

## 2022-04-01 DIAGNOSIS — M86672 Other chronic osteomyelitis, left ankle and foot: Secondary | ICD-10-CM | POA: Diagnosis not present

## 2022-04-01 DIAGNOSIS — E11621 Type 2 diabetes mellitus with foot ulcer: Secondary | ICD-10-CM | POA: Diagnosis not present

## 2022-04-01 NOTE — Progress Notes (Signed)
Colin, [name] ([id number]) [id number] Page 1 of 8 Visit Report for 04/01/2022 HPI Details Patient Name: Date of Service: NA [name] 04/01/2022 10:45 [name] Medical Record Number: [id number] Patient Account Number: [id number] Date of Birth/Sex: Treating RN: [birth year] (66 y.o. [name]) Primary Care Provider: [name] Other Clinician: Referring Provider: Treating Provider/Extender: Phil Dopp in Treatment: 31 History of Present Illness HPI Description: ADMISSION 08/25/2021 This is [name] 66 year old man who initially presented to his primary care provider in September 2022 with pain in his left foot. He was sent for an x-ray and while the x-ray was being performed, the tech pointed out [name] wound on his foot that the patient was not aware existed. He does have type 2 diabetes with significant neuropathy. His diabetes is suboptimally controlled with his most recent A1c being 8.5. He also has [name] history of coronary artery disease status post three- vessel CABG. he was initially seen by orthopedics, but they referred him to [facility]. He has undergone at least 7 operations/debridements and several applications of skin substitute under the care of podiatry. He has been in [name] for much of this time. His most recent procedure was July 28, 2021. [name] was biopsied and was found to be consistent with osteomyelitis. Culture also returned positive for corynebacterium. He was seen on August 16, 2021 by infectious disease. [name] PICC line has been placed and he will be receiving [name] 6-week course of IV daptomycin and cefepime. In October 2022, he underwent lower extremity vascular studies. Results are copied here: Right: Resting right ankle-brachial index is within normal range. No evidence of significant right lower extremity arterial disease. The right toe-brachial index is abnormal. Left: Resting left ankle-brachial  index indicates mild left lower extremity arterial disease. The left toe-brachial index is abnormal. He has not been seen by vascular surgery despite these findings. He presented to clinic today in [name] and is using [name] to offload. Wound VAC was in place. Once this was removed, [name] large ulcer was identified on the left midfoot/ankle. Bone is frankly exposed. There is no malodorous or purulent drainage. There is some granulation tissue over the central portion of the exposed bone. There is [name] tunnel that extends posteriorly for roughly 10 cm. It has been discussed with him by multiple providers that he is at very high risk of losing his lower leg because of this wound. He is extremely eager to avoid this outcome and is here today to review his options as well as receive ongoing wound care. 09/03/2021: Here for reevaluation of his wound. There does not appear to have been any substantial improvement overall since our last visit. He has been in [name] with white foam overlying the exposed bone. We are working on getting him approved for hyperbaric oxygen therapy. 09/10/2021: We are in the process of getting him cleared to begin hyperbaric oxygen therapy. He still needs to obtain [name] chest x-ray. Although the wound measurements are roughly the same, I think the overall appearance of the wound is better. The exposed bone has [name] bit more granulation tissue covering it. He has not received [name] vascular surgery appointment to reevaluate his flow to the wound. 09/17/2021: He has been approved for hyperbaric oxygen therapy and completed his chest x-ray, which I reviewed and it appears normal. The tunnels at the 12 and 10:00 positions are smaller. There is more granulation tissue covering  the exposed bone and the undermining has decreased. He still has not received [name] vascular surgery appointment. 09/24/2021: He initiated hyperbaric oxygen therapy this week and is tolerating it well. He has an  appointment with vascular surgery coming up on May 16. The granulation tissue is covering more of the exposed bone and both tunnels are [name] bit smaller. 10/01/2021: He continues to tolerate hyperbaric oxygen therapy. He saw infectious disease and they are planning to pull his PICC line. He has been initiated on oral antibiotics (doxycycline and Augmentin). The wound looks about the same but the tunnels are [name] little bit smaller. The skin seems to be contracting somewhat around the exposed bone. 10/08/2021: The wound is still about the same size, but the tunnels continue to come in and the skin is contracting around the exposed bone. He continues to have some accumulation of necrotic material in the inferoposterior aspect of the wound as well as accumulation at the 12:00 tunnel area. 10/15/2021: The wound is smaller today. The tunnels continue to come in. There is less necrotic tissue present. He does have some periwound maceration. 10/22/2021: The wound is about the same size. There is [name] little bit less undermining at the distal portion. The exposed bone is dark and I am not sure if this is staining from silver nitrate or his VAC sponge or if it represents necrosis. The tunnels are shallower but he does have some serous drainage coming from the 10:00 tunnel. He continues to tolerate hyperbaric oxygen therapy well. 10/29/2021: The undermining continues to improve. The tunnels are about the same. He has good granulation tissue overlying the majority of the exposed bone. It does appear that perhaps the tubing from his wound VAC has been eroding the skin at the 12 clock position. He continues to accumulate senescent epithelium around the borders of the wound. 11/05/2021: The undermining is almost completely resolved. The tunnels have contracted fairly significantly. No significant slough or debris accumulation. There is still senescent epithelium accumulation around the borders of the wound. He has been tolerating  hyperbaric oxygen therapy well. 11/12/2021: Despite the measurements of the wound being about the same, the wound has changed in its shape and overall, I think it is improved. The undermining has resolved and the tunnels continue to shorten. There is good granulation tissue encroaching over the small area of bone that has remained exposed at the 12 o'clock position. Minimal slough accumulation. He continues to tolerate hyperbaric oxygen therapy well. [name], [name] ([id number]) [id number] Page 2 of 8 11/19/2021: I took [name] PCR culture last week. There was overgrowth of yeast. He is already taking suppressive doxycycline and Augmentin. I added fluconazole to his regimen. The wound is smaller again today. The tunnels continue to shorten. He continues to do well with hyperbaric oxygen therapy. 11/26/2021: For some reason, his foot has become macerated. The wound is narrower but about the same dimensions in its longitudinal aspect. The tunnels continue to shorten. He has some slough buildup on the wound as well as some heaped up senescent epithelium around the perimeter. 12/03/2021: No further maceration of his foot has occurred. The wound has contracted quite significantly from last week. The tunnel at 10:00 is closed. The tunnel at 12:00 is down to just [name] couple of millimeters. No other undermining is present. There is soft tissue coverage of the previously exposed bone. There is just [name] bit of slough and biofilm on the wound surface. 12/10/2021: The wound is looking good. It turns out the tunnel  at 12:00 is only exposed when the patient dorsiflexes his foot. It is about 2 cm in depth when he does this; when his foot is in plantarflexion, the tunnel is closed. The bone that was visible at the 12:00 tunnel is completely covered with granulation tissue, but there does feel like some exposed bone deeper into the tunnel area. There is senescent skin heaped up around the periphery. Minimal  slough on the wound surface. 12/16/2021: The wound dimensions are roughly the same. The surface has nice granulation tissue. The exposed bone at the 12:00 tunnel continues to be covered with more soft tissue. 7/14; patient's wound measures smaller today. Using the wound VAC with underlying collagen. He is also being treated with HBO for underlying osteomyelitis. He tells me he is on doxycycline and ampicillin follows with infectious disease next week 12/31/2021: The wound continues to contract. Unfortunately, the area where the track pad and tubing have been rubbing continues to look like it is applying friction. He says that [facility] the tubing, but nonetheless there is ongoing tissue breakdown at this site. Light slough accumulation on the wound surface. The tunnel continues to contract. He is tolerating HBO without difficulty. 01/07/2022: Bridging the wound VAC away from the ankle has resulted in significant improvement in the tissue at the apex of the wound. The tunnel is still present and is not all that much shorter, but the overall wound surface is very robust and healthy looking. Minimal slough accumulation. No concern for acute infection. 01/21/2022: The wound continues to contract and has [name] robust granulation tissue surface. The tunnel has come in considerably and is down to about 1.4 cm. There is still bone exposed within the tunnel but the rest of it is well covered. There is some senescent epithelium at the wound margins and [name] little bit of slough on the surface. 01/28/2022: No significant change in the wound this week, but there has not been any reaccumulation of senescent epithelium or slough. The tunnel is perhaps [name] millimeter less in depth. He has been approved for Apligraf and we will apply this today. 02/04/2022: The wound has contracted somewhat and the tunnel has filled in completely. The wound surface is  clean. He is here for Apligraf #2. 02/11/2022: The wound has contracted further and is now nearly flush with the surrounding skin surface. Light layer of slough. Apligraf #3 plan for today. 02/18/2022: There is [name] band of epithelium trying to cut across the superior portion of the wound. There is robust granulation tissue with just [name] light layer of slough and biofilm on the surface. There was [name] little bit of greenish drainage in the wound VAC but none appreciated on the site itself. He is here for Apligraf #4. 03/04/2022: The wound has contracted considerably. There is good granulation tissue on the surface. Minimal biofilm. He is here for Apligraf #5. 03/18/2022: The wound has epithelialized to the point that it has been divided into 2 areas. Both areas are smaller in total than at his previous visit. There is good granulation tissue present with just [name] little bit of periwound eschar and surface slough. 10/13; left medial foot. The patient has completed treatment with hyperbaric oxygen for underlying osteomyelitis he has had [name] nice response in the wound. Noted today that he still had some greenish drainage. Previously he has been treated with topical gentamicin but apparently that was stopped 2 weeks ago. Fortunately the wound  is measuring smaller 10/20; wound looks better no hypergranulation minimal drainage. Granulation tissue looks healthy using gentamicin Hydrofera Blue under compression Electronic Signature(s) Signed: 04/01/2022 4:47:23 PM By: Baltazar Najjar MD Entered By: Baltazar Najjar on 04/01/2022 11:23:06 -------------------------------------------------------------------------------- Physical Exam Details Patient Name: Date of Service: NA Colin [name]. 04/01/2022 10:45 [name] Medical Record Number: [id number] Patient Account Number: [id number] Date of Birth/Sex: Treating RN: 07-26-1955 (66 y.o. [name]) Primary Care Provider: [name] Other Clinician: Referring Provider: Treating  Provider/Extender: Phil Dopp in Treatment: 31 Constitutional Patient is hypertensive.. Pulse regular and within target range for patient.Marland Kitchen Respirations regular, non-labored and within target range.. Temperature is normal and within the target range for the patient.Marland Kitchen Appears in no distress. Notes Wound exam; left medial ankle/foot. 2 open areas both of these look healthy no hyper granulated tissue. No evidence of infection nice rims of epithelialization. No mechanical debridement is required [name], [name] ([id number]) [id number] Page 3 of 8 Electronic Signature(s) Signed: 04/01/2022 4:47:23 PM By: Baltazar Najjar MD Entered By: Baltazar Najjar on 04/01/2022 11:25:06 -------------------------------------------------------------------------------- Physician Orders Details Patient Name: Date of Service: NA Colin [name]. 04/01/2022 10:45 [name] Medical Record Number: [id number] Patient Account Number: [id number] Date of Birth/Sex: Treating RN: [birth year] (66 y.o. [name] Primary Care Provider: [name] Other Clinician: Referring Provider: Treating Provider/Extender: Phil Dopp in Treatment: 31 Verbal / Phone Orders: No Diagnosis Coding Follow-up Appointments ppointment in 1 week. - Dr. Lady Gary - room 2 - 10/27 at 10:45 AM Return [name] Anesthetic Wound #1 Left,Medial Foot (In clinic) Topical Lidocaine 4% applied to wound bed Bathing/ Shower/ Hygiene May shower with protection but do not get wound dressing(s) wet. Edema Control - Lymphedema / SCD / Other Elevate legs to the level of the heart or above for 30 minutes daily and/or when sitting, [name] frequency of: Avoid standing for long periods of time. Wound Treatment Wound #1 - Foot Wound Laterality: Left, Medial Cleanser: Soap and Water 1 x Per Week/30 Days Discharge Instructions: May shower and wash wound with dial antibacterial soap and water prior  to dressing change. Cleanser: Wound Cleanser 1 x Per Week/30 Days Discharge Instructions: Cleanse the wound with wound cleanser prior to applying [name] clean dressing using gauze sponges, not tissue or cotton balls. Peri-Wound Care: Sween Lotion (Moisturizing lotion) 1 x Per Week/30 Days Discharge Instructions: Apply moisturizing lotion as directed Topical: Gentamicin 1 x Per Week/30 Days Discharge Instructions: As directed by physician Prim Dressing: Hydrofera Blue Classic Foam, 4x4 in 1 x Per Week/30 Days ary Discharge Instructions: Moisten with saline prior to applying to wound bed Secondary Dressing: ABD Pad, 5x9 1 x Per Week/30 Days Discharge Instructions: Apply over primary dressing as directed. Secondary Dressing: Woven Gauze Sponge, Non-Sterile 4x4 in 1 x Per Week/30 Days Discharge Instructions: Apply over primary dressing as directed. Secured With: Transpore Surgical Tape, 2x10 (in/yd) 1 x Per Week/30 Days Discharge Instructions: Secure dressing with tape as directed. Compression Wrap: Kerlix Roll 4.5x3.1 (in/yd) 1 x Per Week/30 Days Discharge Instructions: Apply Kerlix and Coban compression as directed. Compression Wrap: Coban Self-Adherent Wrap 4x5 (in/yd) 1 x Per Week/30 Days Discharge Instructions: Apply over Kerlix as directed. Patient Medications llergies: codeine [name] Notifications Medication Indication Start End [name], [name] ([id number]) [id number] Page 4 of 8 04/01/2022 lidocaine DOSE topical 4 % cream - cream topical Electronic Signature(s) Signed: 04/01/2022 3:35:50 PM By: Samuella Bruin Signed: 04/01/2022 4:47:23 PM By: Baltazar Najjar MD Entered By:  Samuella Bruin on 04/01/2022 11:14:25 -------------------------------------------------------------------------------- Problem List Details Patient Name: Date of Service: NA [name] 04/01/2022 10:45 [name] Medical Record Number: [id number] Patient Account Number:  [id number] Date of Birth/Sex: Treating RN: 08-10-1955 (66 y.o. 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 09/21/2021 No Yes N82.956 Other chronic osteomyelitis, left ankle and foot 08/25/2021 No Yes E11.610 Type 2 diabetes mellitus with diabetic neuropathic arthropathy 08/25/2021 No Yes E11.621 Type 2 diabetes mellitus with foot ulcer 08/25/2021 No Yes I25.10 Atherosclerotic heart disease of native coronary artery without angina pectoris 08/25/2021 No Yes Inactive Problems Resolved Problems Electronic Signature(s) Signed: 04/01/2022 4:47:23 PM By: Baltazar Najjar MD Entered By: Baltazar Najjar on 04/01/2022 11:10:08 -------------------------------------------------------------------------------- Progress Note Details Patient Name: Date of Service: NA Colin [name]. 04/01/2022 10:45 [name] Medical Record Number: [id number] Patient Account Number: [id number] Date of Birth/Sex: Treating RN: 08/15/1955 (66 y.o. Jacques Earthly, Stephannie Peters [name] ([id number]) [id number] Page 5 of 8 Primary Care Provider: [name] Other Clinician: Referring Provider: Treating Provider/Extender: Phil Dopp in Treatment: 31 Subjective History of Present Illness (HPI) ADMISSION 08/25/2021 This is [name] 66 year old man who initially presented to his primary care provider in September 2022 with pain in his left foot. He was sent for an x-ray and while the x-ray was being performed, the tech pointed out [name] wound on his foot that the patient was not aware existed. He does have type 2 diabetes with significant neuropathy. His diabetes is suboptimally controlled with his most recent A1c being 8.5. He also has [name] history of coronary  artery disease status post three- vessel CABG. he was initially seen by orthopedics, but they referred him to [facility]. He has undergone at least 7 operations/debridements and several applications of skin substitute under the care of podiatry. He has been in [name] for much of this time. His most recent procedure was July 28, 2021. [name] was biopsied and was found to be consistent with osteomyelitis. Culture also returned positive for corynebacterium. He was seen on August 16, 2021 by infectious disease. [name] PICC line has been placed and he will be receiving [name] 6-week course of IV daptomycin and cefepime. In October 2022, he underwent lower extremity vascular studies. Results are copied here: Right: Resting right ankle-brachial index is within normal range. No evidence of significant right lower extremity arterial disease. The right toe-brachial index is abnormal. Left: Resting left ankle-brachial index indicates mild left lower extremity arterial disease. The left toe-brachial index is abnormal. He has not been seen by vascular surgery despite these findings. He presented to clinic today in [name] and is using [name] to offload. Wound VAC was in place. Once this was removed, [name] large ulcer was identified on the left midfoot/ankle. Bone is frankly exposed. There is no malodorous or purulent drainage. There is some granulation tissue over the central portion of the exposed bone. There is [name] tunnel that extends posteriorly for roughly 10 cm. It has been discussed with him by multiple providers that he is at very high risk of losing his lower leg because of this wound. He is extremely eager to avoid this outcome and is here today to review his options as well as receive ongoing wound  care. 09/03/2021: Here for reevaluation of his wound. There does not appear to have been any substantial improvement overall since our last visit. He has been in [name] wound  VAC with white foam overlying the exposed bone. We are working on getting him approved for hyperbaric oxygen therapy. 09/10/2021: We are in the process of getting him cleared to begin hyperbaric oxygen therapy. He still needs to obtain [name] chest x-ray. Although the wound measurements are roughly the same, I think the overall appearance of the wound is better. The exposed bone has [name] bit more granulation tissue covering it. He has not received [name] vascular surgery appointment to reevaluate his flow to the wound. 09/17/2021: He has been approved for hyperbaric oxygen therapy and completed his chest x-ray, which I reviewed and it appears normal. The tunnels at the 12 and 10:00 positions are smaller. There is more granulation tissue covering the exposed bone and the undermining has decreased. He still has not received [name] vascular surgery appointment. 09/24/2021: He initiated hyperbaric oxygen therapy this week and is tolerating it well. He has an appointment with vascular surgery coming up on May 16. The granulation tissue is covering more of the exposed bone and both tunnels are [name] bit smaller. 10/01/2021: He continues to tolerate hyperbaric oxygen therapy. He saw infectious disease and they are planning to pull his PICC line. He has been initiated on oral antibiotics (doxycycline and Augmentin). The wound looks about the same but the tunnels are [name] little bit smaller. The skin seems to be contracting somewhat around the exposed bone. 10/08/2021: The wound is still about the same size, but the tunnels continue to come in and the skin is contracting around the exposed bone. He continues to have some accumulation of necrotic material in the inferoposterior aspect of the wound as well as accumulation at the 12:00 tunnel area. 10/15/2021: The wound is smaller today. The tunnels continue to come in. There is less necrotic tissue present. He does have some periwound maceration. 10/22/2021: The wound is about the same size.  There is [name] little bit less undermining at the distal portion. The exposed bone is dark and I am not sure if this is staining from silver nitrate or his VAC sponge or if it represents necrosis. The tunnels are shallower but he does have some serous drainage coming from the 10:00 tunnel. He continues to tolerate hyperbaric oxygen therapy well. 10/29/2021: The undermining continues to improve. The tunnels are about the same. He has good granulation tissue overlying the majority of the exposed bone. It does appear that perhaps the tubing from his wound VAC has been eroding the skin at the 12 clock position. He continues to accumulate senescent epithelium around the borders of the wound. 11/05/2021: The undermining is almost completely resolved. The tunnels have contracted fairly significantly. No significant slough or debris accumulation. There is still senescent epithelium accumulation around the borders of the wound. He has been tolerating hyperbaric oxygen therapy well. 11/12/2021: Despite the measurements of the wound being about the same, the wound has changed in its shape and overall, I think it is improved. The undermining has resolved and the tunnels continue to shorten. There is good granulation tissue encroaching over the small area of bone that has remained exposed at the 12 o'clock position. Minimal slough accumulation. He continues to tolerate hyperbaric oxygen therapy well. 11/19/2021: I took [name] PCR culture last week. There was overgrowth of yeast. He is already taking suppressive doxycycline and Augmentin. I added fluconazole to  his regimen. The wound is smaller again today. The tunnels continue to shorten. He continues to do well with hyperbaric oxygen therapy. 11/26/2021: For some reason, his foot has become macerated. The wound is narrower but about the same dimensions in its longitudinal aspect. The tunnels continue to shorten. He has some slough buildup on the wound as well as some heaped up  senescent epithelium around the perimeter. 12/03/2021: No further maceration of his foot has occurred. The wound has contracted quite significantly from last week. The tunnel at 10:00 is closed. The tunnel at 12:00 is down to just [name] couple of millimeters. No other undermining is present. There is soft tissue coverage of the previously exposed bone. There is just [name] bit of slough and biofilm on the wound surface. 12/10/2021: The wound is looking good. It turns out the tunnel at 12:00 is only exposed when the patient dorsiflexes his foot. It is about 2 cm in depth when he does this; when his foot is in plantarflexion, the tunnel is closed. The bone that was visible at the 12:00 tunnel is completely covered with granulation tissue, but there does feel like some exposed bone deeper into the tunnel area. There is senescent skin heaped up around the periphery. Minimal slough on the wound surface. 12/16/2021: The wound dimensions are roughly the same. The surface has nice granulation tissue. The exposed bone at the 12:00 tunnel continues to be covered with more soft tissue. [name], [name] ([id number]) [id number] Page 6 of 8 7/14; patient's wound measures smaller today. Using the wound VAC with underlying collagen. He is also being treated with HBO for underlying osteomyelitis. He tells me he is on doxycycline and ampicillin follows with infectious disease next week 12/31/2021: The wound continues to contract. Unfortunately, the area where the track pad and tubing have been rubbing continues to look like it is applying friction. He says that the home health nurses that have been applying the [facility] have been putting gauze underneath the tubing, but nonetheless there is ongoing tissue breakdown at this site. Light slough accumulation on the wound surface. The tunnel continues to contract. He is tolerating HBO without difficulty. 01/07/2022: Bridging the wound VAC away from the ankle has  resulted in significant improvement in the tissue at the apex of the wound. The tunnel is still present and is not all that much shorter, but the overall wound surface is very robust and healthy looking. Minimal slough accumulation. No concern for acute infection. 01/21/2022: The wound continues to contract and has [name] robust granulation tissue surface. The tunnel has come in considerably and is down to about 1.4 cm. There is still bone exposed within the tunnel but the rest of it is well covered. There is some senescent epithelium at the wound margins and [name] little bit of slough on the surface. 01/28/2022: No significant change in the wound this week, but there has not been any reaccumulation of senescent epithelium or slough. The tunnel is perhaps [name] millimeter less in depth. He has been approved for Apligraf and we will apply this today. 02/04/2022: The wound has contracted somewhat and the tunnel has filled in completely. The wound surface is clean. He is here for Apligraf #2. 02/11/2022: The wound has contracted further and is now nearly flush with the surrounding skin surface. Light layer of slough. Apligraf #3 plan for today. 02/18/2022: There is [name] band of epithelium trying to cut across the superior portion of the wound. There is robust granulation tissue with just [name]  light layer of slough and biofilm on the surface. There was [name] little bit of greenish drainage in the wound VAC but none appreciated on the site itself. He is here for Apligraf #4. 03/04/2022: The wound has contracted considerably. There is good granulation tissue on the surface. Minimal biofilm. He is here for Apligraf #5. 03/18/2022: The wound has epithelialized to the point that it has been divided into 2 areas. Both areas are smaller in total than at his previous visit. There is good granulation tissue present with just [name] little bit of periwound eschar and surface slough. 10/13; left medial foot. The patient has completed treatment with  hyperbaric oxygen for underlying osteomyelitis he has had [name] nice response in the wound. Noted today that he still had some greenish drainage. Previously he has been treated with topical gentamicin but apparently that was stopped 2 weeks ago. Fortunately the wound is measuring smaller 10/20; wound looks better no hypergranulation minimal drainage. Granulation tissue looks healthy using gentamicin Hydrofera Blue under compression Objective Constitutional Patient is hypertensive.. Pulse regular and within target range for patient.Marland Kitchen Respirations regular, non-labored and within target range.. Temperature is normal and within the target range for the patient.Marland Kitchen Appears in no distress. Vitals Time Taken: 10:50 AM, Height: 74 in, Weight: 186 lbs, BMI: 23.9, Temperature: 97.5 F, Pulse: 87 bpm, Respiratory Rate: 16 breaths/min, Blood Pressure: 173/93 mmHg. General Notes: Wound exam; left medial ankle/foot. 2 open areas both of these look healthy no hyper granulated tissue. No evidence of infection nice rims of epithelialization. No mechanical debridement is required Integumentary (Hair, Skin) Wound #1 status is Open. Original cause of wound was Gradually Appeared. The date acquired was: 02/22/2021. The wound has been in treatment 31 weeks. The wound is located on the Left,Medial Foot. The wound measures 4cm length x 0.8cm width x 0.1cm depth; 2.513cm^2 area and 0.251cm^3 volume. There is Fat Layer (Subcutaneous Tissue) exposed. There is no tunneling or undermining noted. There is [name] medium amount of serosanguineous drainage noted. The wound margin is distinct with the outline attached to the wound base. There is medium (34-66%) red granulation within the wound bed. There is [name] medium (34- 66%) amount of necrotic tissue within the wound bed including Adherent Slough. The periwound skin appearance had no abnormalities noted for texture. The periwound skin appearance had no abnormalities noted for color. The  periwound skin appearance exhibited: Dry/Scaly, Maceration. Periwound temperature was noted as No Abnormality. 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 - Dr. Lady Gary - room 2 - 10/27 at 10:45 AM Anesthetic: Wound #1 Left,Medial Foot: (In clinic) Topical Lidocaine 4% applied to wound bed Bathing/ Shower/ Hygiene: May shower with protection but do not get wound dressing(s) wet. Edema Control - Lymphedema / SCD / Other: Elevate legs to the level of the heart or above for 30 minutes daily and/or when sitting, [name] frequency of: Avoid standing for long periods of time. The following medication(s) was prescribed: lidocaine topical 4 % cream cream topical was prescribed at facility WOUND #1: - Foot Wound Laterality: Left, Medial Cleanser: Soap and Water 1 x Per Week/30 Days Discharge Instructions: May shower and wash wound with dial antibacterial soap and  water prior to dressing change. Cleanser: Wound Cleanser 1 x Per Week/30 Days Discharge Instructions: Cleanse the wound with wound cleanser prior to applying [name] clean dressing using gauze sponges, not tissue or cotton balls. Peri-Wound Care: Sween Lotion (Moisturizing lotion) 1 x Per Week/30 Days Discharge Instructions: Apply moisturizing lotion as directed Topical: Gentamicin 1 x Per Week/30 Days Discharge Instructions: As directed by physician Prim Dressing: Hydrofera Blue Classic Foam, 4x4 in 1 x Per Week/30 Days ary Discharge Instructions: Moisten with saline prior to applying to wound bed Secondary Dressing: ABD Pad,  5x9 1 x Per Week/30 Days Discharge Instructions: Apply over primary dressing as directed. Secondary Dressing: Woven Gauze Sponge, Non-Sterile 4x4 in 1 x Per Week/30 Days Discharge Instructions: Apply over primary dressing as directed. Secured With: Transpore Surgical T ape, 2x10 (in/yd) 1 x Per Week/30 Days Discharge Instructions: Secure dressing with tape as directed. Com pression Wrap: Kerlix Roll 4.5x3.1 (in/yd) 1 x Per Week/30 Days Discharge Instructions: Apply Kerlix and Coban compression as directed. Com pression Wrap: Coban Self-Adherent Wrap 4x5 (in/yd) 1 x Per Week/30 Days Discharge Instructions: Apply over Kerlix as directed. 1. No change in the primary dressing light gentamicin Hydrofera Blue/ABD kerlix Coban 2. The hypergranulation he had last week is gone we used silver nitrate Electronic Signature(s) Signed: 04/01/2022 4:47:23 PM By: Linton Ham MD Entered By: Linton Ham on 04/01/2022 11:27:59 -------------------------------------------------------------------------------- SuperBill Details Patient Name: Date of Service: Colin Loveless [name]. 04/01/2022 Medical Record Number: [id number] Patient Account Number: [id number] Date of Birth/Sex: Treating RN: 1955-12-26 (66 y.o. 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 M86.672 Other chronic osteomyelitis, left ankle and foot E11.610 Type 2 diabetes mellitus with diabetic neuropathic arthropathy E11.621 Type 2 diabetes mellitus with foot ulcer I25.10 Atherosclerotic heart disease of native coronary artery without angina pectoris Physician Procedures : CPT4 Code Description Modifier [id number] - WC PHYS LEVEL 3 - EST PT ICD-10 Diagnosis Description L97.324 Non-pressure chronic ulcer of left  ankle with necrosis of bone M86.672 Other chronic osteomyelitis, left ankle and foot Mcduffey, Anvith [name]  ([id number]) [id number].pdf Page Quantity: 1 8 of 8 Electronic Signature(s) Signed: 04/01/2022 4:47:23 PM By: Linton Ham MD Entered By: Linton Ham on 04/01/2022 11:28:26

## 2022-04-01 NOTE — Progress Notes (Signed)
MARQUETTE, BLODGETT A ([id number]) [id number] Page 1 of 7 Visit Report for 04/01/2022 Arrival Information Details Patient Name: Date of Service: NA [name] 04/01/2022 10:45 A M Medical Record Number: [id number] Patient Account Number: [id number] Date of Birth/Sex: Treating RN: 1956/05/31 (66 y.o. [name] Primary Care Artis Beggs: [name] Other Clinician: Referring Willet Schleifer: Treating Cristo Ausburn/Extender: Winfred [place] in Treatment: 34 Visit Information History Since Last Visit Added or deleted any medications: No Patient Arrived: Knee Scooter Any new allergies or adverse reactions: No Arrival Time: 10:48 Had a fall or experienced change in No Accompanied By: self activities of daily living that may affect Transfer Assistance: None risk of falls: Patient Identification Verified: Yes Signs or symptoms of abuse/neglect since last visito No Secondary Verification Process Completed: Yes Hospitalized since last visit: No Patient Requires Transmission-Based Precautions: No Implantable device outside of the clinic excluding No Patient Has Alerts: Yes cellular tissue based products placed in the center Patient Alerts: Patient on Blood Thinner since last visit: Has Dressing in Place as Prescribed: Yes Has Compression in Place as Prescribed: Yes Pain Present Now: No Electronic Signature(s) Signed: 04/01/2022 3:35:50 PM By: Adline Peals Entered By: Adline Peals on 04/01/2022 10:50:51 -------------------------------------------------------------------------------- Encounter Discharge Information Details Patient Name: Date of Service: Colin Loveless A. 04/01/2022 10:45 A M Medical Record Number: [id number] Patient Account Number: [id number] Date of Birth/Sex: Treating RN: [birth year] (66 y.o. [name] Primary Care Moxon Messler: [name] Other Clinician: Referring Darleene Cumpian: Treating Jerimyah Vandunk/Extender:  Winfred Leeds in Treatment: 70 Encounter Discharge Information Items Discharge Condition: Stable Ambulatory Status: Knee Scooter Discharge Destination: Home Transportation: Private Auto Accompanied By: self Schedule Follow-up Appointment: Yes Clinical Summary of Care: Patient Declined Electronic Signature(s) Signed: 04/01/2022 3:35:50 PM By: Adline Peals Entered By: Adline Peals on 04/01/2022 11:24:12 [name] ([id number]) [id number] Page 2 of 7 -------------------------------------------------------------------------------- Lower Extremity Assessment Details Patient Name: Date of Service: [name] 04/01/2022 10:45 A M Medical Record Number: [id number] Patient Account Number: [id number] Date of Birth/Sex: Treating RN: 1956/01/15 (66 y.o. [name] Primary Care Jerric Oyen: [name] Other Clinician: Referring Dilcia Rybarczyk: Treating Addasyn Mcbreen/Extender: Ladona Ridgel Weeks in Treatment: 31 Edema Assessment Assessed: Shirlyn Goltz: No] Patrice Paradise: No] Edema: [Left: Ye] [Right: s] Calf Left: Right: Point of Measurement: From Medial Instep 32 cm Ankle Left: Right: Point of Measurement: From Medial Instep 22.3 cm Vascular Assessment Pulses: Dorsalis Pedis Palpable: [Left:Yes] Electronic Signature(s) Signed: 04/01/2022 3:35:50 PM By: Adline Peals Entered By: Adline Peals on 04/01/2022 10:58:26 -------------------------------------------------------------------------------- Multi Wound Chart Details Patient Name: Date of Service: NA [name] A. 04/01/2022 10:45 A M Medical Record Number: [id number] Patient Account Number: [id number] Date of Birth/Sex: Treating RN: March 01, 1956 (66 y.o. M) Primary Care Jamieka Royle: [name] Other Clinician: Referring Shalawn Wynder: Treating Gavinn Collard/Extender: Winfred Leeds in Treatment: 31 Vital Signs Height(in): 74 Pulse(bpm):  39 Weight(lbs): 186 Blood Pressure(mmHg): 173/93 Body Mass Index(BMI): 23.9 Temperature(F): 97.5 Respiratory Rate(breaths/min): 16 [1:Photos:] [N/A:N/A] Left, Medial Foot N/A N/A Wound Location: Gradually Appeared N/A N/A Wounding Event: Diabetic Wound/Ulcer of the Lower N/A N/A Primary Etiology: Extremity Cataracts, Coronary Artery Disease, N/A N/A Comorbid History: Hypertension, Myocardial Infarction, Peripheral Arterial Disease, Type II Diabetes, Osteomyelitis, Neuropathy 02/22/2021 N/A N/A Date Acquired: 31 N/A N/A Weeks of Treatment: Open N/A N/A Wound Status: No N/A N/A Wound Recurrence: 4x0.8x0.1 N/A N/A Measurements L x W x D (cm) 2.513 N/A N/A A (cm) : rea 0.251 N/A  N/A Volume (cm) : 82.20% N/A N/A % Reduction in A rea: 98.20% N/A N/A % Reduction in Volume: Grade 3 N/A N/A Classification: Medium N/A N/A Exudate A mount: Serosanguineous N/A N/A Exudate Type: red, brown N/A N/A Exudate Color: Distinct, outline attached N/A N/A Wound Margin: Medium (34-66%) N/A N/A Granulation A mount: Red N/A N/A Granulation Quality: Medium (34-66%) N/A N/A Necrotic A mount: Fat Layer (Subcutaneous Tissue): Yes N/A N/A Exposed Structures: Fascia: No Tendon: No Muscle: No Joint: No Bone: No Medium (34-66%) N/A N/A Epithelialization: Excoriation: No N/A N/A Periwound Skin Texture: Induration: No Callus: No Crepitus: No Rash: No Scarring: No Maceration: Yes N/A N/A Periwound Skin Moisture: Dry/Scaly: Yes No Abnormalities Noted N/A N/A Periwound Skin Color: No Abnormality N/A N/A Temperature: Treatment Notes Electronic Signature(s) Signed: 04/01/2022 4:47:23 PM By: Baltazar Najjar MD Entered By: Baltazar Najjar on 04/01/2022 11:10:37 -------------------------------------------------------------------------------- Multi-Disciplinary Care Plan Details Patient Name: Date of Service: Colin Fanning A. 04/01/2022 10:45 A M Medical Record Number:  [id number] Patient Account Number: [id number] Date of Birth/Sex: Treating RN: 10-23-1955 (66 y.o. [name] Primary Care Briya Lookabaugh: [name] Other Clinician: Referring Delonna Ney: Treating Tip Atienza/Extender: Phil Dopp in Treatment: 31 Multidisciplinary Care Plan reviewed with physician Active Inactive Nutrition Nursing Diagnoses: Impaired glucose control: actual or potential Goals: Patient/caregiver verbalizes understanding of need to maintain therapeutic glucose control per primary care physician Date Initiated: 08/25/2021 Target Resolution Date: 05/13/2022 DIXIE, JAFRI ([id number]) [phone].pdf Page 4 of 7 Goal Status: Active Interventions: Assess HgA1c results as ordered upon admission and as needed Provide education on elevated blood sugars and impact on wound healing Notes: Osteomyelitis Nursing Diagnoses: Infection: osteomyelitis Knowledge deficit related to disease process and management Goals: Patient's osteomyelitis will resolve Date Initiated: 09/17/2021 Target Resolution Date: 05/13/2022 Goal Status: Active Interventions: Assess for signs and symptoms of osteomyelitis resolution every visit Provide education on osteomyelitis Treatment Activities: Surgical debridement : 09/17/2021 Systemic antibiotics : 09/17/2021 T ordered outside of clinic : 09/17/2021 est Notes: Electronic Signature(s) Signed: 04/01/2022 3:35:50 PM By: Samuella Bruin Entered By: Samuella Bruin on 04/01/2022 11:05:26 -------------------------------------------------------------------------------- Pain Assessment Details Patient Name: Date of Service: Colin Fanning A. 04/01/2022 10:45 A M Medical Record Number: [id number] Patient Account Number: [id number] Date of Birth/Sex: Treating RN: [birth year] (66 y.o. [name] Primary Care Chasitty Hehl: [name] Other Clinician: Referring Armetta Henri: Treating Merrilyn Legler/Extender:  Phil Dopp in Treatment: 31 Active Problems Location of Pain Severity and Description of Pain Patient Has Paino No Site Locations Rate the pain. Current Pain Level: 0 Pain Management and Medication Current Pain Management: VIGNESH, WILLERT A ([id number]) [phone].pdf Page 5 of 7 Electronic Signature(s) Signed: 04/01/2022 3:35:50 PM By: Gelene Mink By: Samuella Bruin on 04/01/2022 10:51:17 -------------------------------------------------------------------------------- Patient/Caregiver Education Details Patient Name: Date of Service: NA [name] 10/20/2023andnbsp10:45 A M Medical Record Number: [id number] Patient Account Number: [id number] Date of Birth/Gender: Treating RN: 09-11-1955 (66 y.o. [name] Primary Care Physician: [name] Other Clinician: Referring Physician: Treating Physician/Extender: Phil Dopp in Treatment: 31 Education Assessment Education Provided To: Patient Education Topics Provided Wound/Skin Impairment: Methods: Explain/Verbal Responses: Reinforcements needed, State content correctly Electronic Signature(s) Signed: 04/01/2022 3:35:50 PM By: Samuella Bruin Entered By: Samuella Bruin on 04/01/2022 11:05:37 -------------------------------------------------------------------------------- Wound Assessment Details Patient Name: Date of Service: Colin Fanning A. 04/01/2022 10:45 A M Medical Record Number: [id number] Patient Account Number: [id number] Date of Birth/Sex: Treating RN: [birth year] (66 y.o. M)  Samuella Bruin Primary Care Shayne Deerman: [name] Other Clinician: Referring Sarinah Doetsch: Treating Elga Santy/Extender: Albertine Patricia Weeks in Treatment: 31 Wound Status Wound Number: 1 Primary Diabetic Wound/Ulcer of the Lower Extremity Etiology: Wound Location: Left, Medial Foot Wound Open Wounding Event: Gradually  Appeared Status: Date Acquired: 02/22/2021 Comorbid Cataracts, Coronary Artery Disease, Hypertension, Myocardial Weeks Of Treatment: 31 History: Infarction, Peripheral Arterial Disease, Type II Diabetes, Clustered Wound: No Osteomyelitis, Neuropathy Photos SUMNER, KIRCHMAN A ([id number]) [id number].pdf Page 6 of 7 Wound Measurements Length: (cm) 4 Width: (cm) 0.8 Depth: (cm) 0.1 Area: (cm) 2.513 Volume: (cm) 0.251 % Reduction in Area: 82.2% % Reduction in Volume: 98.2% Epithelialization: Medium (34-66%) Tunneling: No Undermining: No Wound Description Classification: Grade 3 Wound Margin: Distinct, outline attached Exudate Amount: Medium Exudate Type: Serosanguineous Exudate Color: red, brown Foul Odor After Cleansing: No Slough/Fibrino Yes Wound Bed Granulation Amount: Medium (34-66%) Exposed Structure Granulation Quality: Red Fascia Exposed: No Necrotic Amount: Medium (34-66%) Fat Layer (Subcutaneous Tissue) Exposed: Yes Necrotic Quality: Adherent Slough Tendon Exposed: No Muscle Exposed: No Joint Exposed: No Bone Exposed: No Periwound Skin Texture Texture Color No Abnormalities Noted: Yes No Abnormalities Noted: Yes Moisture Temperature / Pain No Abnormalities Noted: No Temperature: No Abnormality Dry / Scaly: Yes Maceration: Yes Treatment Notes Wound #1 (Foot) Wound Laterality: Left, Medial Cleanser Soap and Water Discharge Instruction: May shower and wash wound with dial antibacterial soap and water prior to dressing change. Wound Cleanser Discharge Instruction: Cleanse the wound with wound cleanser prior to applying a clean dressing using gauze sponges, not tissue or cotton balls. Peri-Wound Care Sween Lotion (Moisturizing lotion) Discharge Instruction: Apply moisturizing lotion as directed Topical Gentamicin Discharge Instruction: As directed by physician Primary Dressing Hydrofera Blue Classic Foam, 4x4 in Discharge  Instruction: Moisten with saline prior to applying to wound bed Secondary Dressing ABD Pad, 5x9 Discharge Instruction: Apply over primary dressing as directed. Woven Gauze Sponge, Non-Sterile 4x4 in Discharge Instruction: Apply over primary dressing as directed. [place], SOUTER A ([id number]) [id number] Page 7 of 7 Secured With Transpore Surgical Tape, 2x10 (in/yd) Discharge Instruction: Secure dressing with tape as directed. Compression Wrap Kerlix Roll 4.5x3.1 (in/yd) Discharge Instruction: Apply Kerlix and Coban compression as directed. Coban Self-Adherent Wrap 4x5 (in/yd) Discharge Instruction: Apply over Kerlix as directed. Compression Stockings Add-Ons Electronic Signature(s) Signed: 04/01/2022 3:35:50 PM By: Samuella Bruin Entered By: Samuella Bruin on 04/01/2022 11:00:26 -------------------------------------------------------------------------------- Vitals Details Patient Name: Date of Service: NA [name] A. 04/01/2022 10:45 A M Medical Record Number: [id number] Patient Account Number: [id number] Date of Birth/Sex: Treating RN: Oct 04, 1955 (66 y.o. [name] Primary Care Jonthan Leite: [name] Other Clinician: Referring Stacey Maura: Treating Matther Labell/Extender: Phil Dopp in Treatment: 31 Vital Signs Time Taken: 10:50 Temperature (F): 97.5 Height (in): 74 Pulse (bpm): 87 Weight (lbs): 186 Respiratory Rate (breaths/min): 16 Body Mass Index (BMI): 23.9 Blood Pressure (mmHg): 173/93 Reference Range: 80 - 120 mg / dl Electronic Signature(s) Signed: 04/01/2022 3:35:50 PM By: Samuella Bruin Entered By: Samuella Bruin on 04/01/2022 10:51:11

## 2022-04-01 NOTE — Patient Outreach (Signed)
  Care Coordination   04/01/2022 Name: [name] MRN: [id number] DOB: [birth year]   Care Coordination Outreach Attempts:  An unsuccessful telephone outreach was attempted today to offer the patient information about available care coordination services as a benefit of their health plan.   Follow Up Plan:  Additional outreach attempts will be made to offer the patient care coordination information and services.   Encounter Outcome:  No Answer  Care Coordination Interventions Activated:  No   Care Coordination Interventions:  No, not indicated    Milus Height, BSW  MSW, [place] Social Worker [facility]  [phone]

## 2022-04-08 ENCOUNTER — Encounter (HOSPITAL_BASED_OUTPATIENT_CLINIC_OR_DEPARTMENT_OTHER): Payer: BC Managed Care – PPO | Admitting: General Surgery

## 2022-04-08 DIAGNOSIS — L97324 Non-pressure chronic ulcer of left ankle with necrosis of bone: Secondary | ICD-10-CM | POA: Diagnosis not present

## 2022-04-08 DIAGNOSIS — I2510 Atherosclerotic heart disease of native coronary artery without angina pectoris: Secondary | ICD-10-CM | POA: Diagnosis not present

## 2022-04-08 DIAGNOSIS — E1140 Type 2 diabetes mellitus with diabetic neuropathy, unspecified: Secondary | ICD-10-CM | POA: Diagnosis not present

## 2022-04-08 DIAGNOSIS — M86672 Other chronic osteomyelitis, left ankle and foot: Secondary | ICD-10-CM | POA: Diagnosis not present

## 2022-04-08 DIAGNOSIS — E11610 Type 2 diabetes mellitus with diabetic neuropathic arthropathy: Secondary | ICD-10-CM | POA: Diagnosis not present

## 2022-04-08 DIAGNOSIS — Z951 Presence of aortocoronary bypass graft: Secondary | ICD-10-CM | POA: Diagnosis not present

## 2022-04-08 DIAGNOSIS — E11621 Type 2 diabetes mellitus with foot ulcer: Secondary | ICD-10-CM | POA: Diagnosis not present

## 2022-04-08 DIAGNOSIS — E1169 Type 2 diabetes mellitus with other specified complication: Secondary | ICD-10-CM | POA: Diagnosis not present

## 2022-04-08 NOTE — Progress Notes (Signed)
BRADRICK, LAFRENIER [name] ([id number]) [id number] Page 1 of 11 Visit Report for 04/08/2022 Chief Complaint Document Details Patient Name: Date of Service: NA Colin [name] 04/08/2022 10:45 [name] Medical Record Number: [id number] Patient Account Number: [id number] Date of Birth/Sex: Treating RN: 1955/09/15 (66 y.o. M) Primary Care Provider: [name] Other Clinician: Referring Provider: Treating Provider/Extender: [name] in Treatment: 58 Information Obtained from: Patient Chief Complaint Patients presents for treatment of an open diabetic ulcer with exposed bone and osteomyelitis Electronic Signature(s) Signed: 04/08/2022 11:05:16 AM By: [name] Colin [name] Entered By: [name] on 04/08/2022 11:05:16 -------------------------------------------------------------------------------- Debridement Details Patient Name: Date of Service: NA Colin [name]. 04/08/2022 10:45 [name] Medical Record Number: [id number] Patient Account Number: [id number] Date of Birth/Sex: Treating RN: [birth year] (66 y.o. [name] Primary Care Provider: [name] Other Clinician: Referring Provider: Treating Provider/Extender: [name] in Treatment: 32 Debridement Performed for Assessment: Wound #1 Left,Medial Foot Performed By: Physician [name], Colin [name] Debridement Type: Debridement Severity of Tissue Pre Debridement: Fat layer exposed Level of Consciousness (Pre-procedure): Awake and Alert Pre-procedure Verification/Time Out Yes - 11:02 Taken: Start Time: 11:02 Pain Control: Lidocaine 2% T opical Liquid T Area Debrided (L x W): otal 4.3 (cm) x 1.1 (cm) = 4.73 (cm) Tissue and other material debrided: Non-Viable, Slough, Slough Level: Non-Viable Tissue Debridement Description: Selective/Open Wound Instrument: Curette Bleeding: Minimum Hemostasis Achieved: Pressure Response to Treatment: Procedure was  tolerated well Level of Consciousness (Post- Awake and Alert procedure): Post Debridement Measurements of Total Wound Length: (cm) 4.3 Width: (cm) 1 Depth: (cm) 0.1 Volume: (cm) 0.338 Character of Wound/Ulcer Post Debridement: Improved Severity of Tissue Post Debridement: Fat layer exposed Post Procedure Diagnosis Same as Pre-procedure Colin [name], Colin [name] ([id number] Page 2 of 11 Notes scribed for Dr. Celine Ahr by Colin Peals, RN Electronic Signature(s) Signed: 04/08/2022 12:04:20 PM By: [name] Colin [name] Signed: 04/08/2022 4:25:07 PM By: Colin [name] Entered By: Colin [name] on 04/08/2022 11:03:24 -------------------------------------------------------------------------------- HPI Details Patient Name: Date of Service: NA Colin [name]. 04/08/2022 10:45 [name] Medical Record Number: [id number] Patient Account Number: [id number] Date of Birth/Sex: Treating RN: Apr 27, 1956 (66 y.o. M) Primary Care Provider: [name] Other Clinician: Referring Provider: Treating Provider/Extender: [name] in Treatment: 34 History of Present Illness HPI Description: ADMISSION 08/25/2021 This is Colin [name] who initially presented to his primary care provider in September 2022 with pain in his left foot. He was sent for an x-ray and while the x-ray was being performed, the tech pointed out [name] wound on his foot that the patient was not aware existed. He does have type 2 diabetes with significant neuropathy. His diabetes is suboptimally controlled with his most recent A1c being 8.5. He also has [name] history of coronary artery disease status post three- vessel CABG. he was initially seen by orthopedics, but they referred him to [facility]. He has undergone at least 7 operations/debridements and several applications of skin substitute under the care of podiatry. He has been in [name] for much of  this time. His most recent procedure was July 28, 2021. [name] was biopsied and was found to be consistent with osteomyelitis. Culture also returned positive for corynebacterium. He was seen on August 16, 2021 by infectious disease. [name] PICC line has been placed and he will be receiving [name] 6-week course of IV daptomycin and cefepime. In October  2022, he underwent lower extremity vascular studies. Results are copied here: Right: Resting right ankle-brachial index is within normal range. No evidence of significant right lower extremity arterial disease. The right toe-brachial index is abnormal. Left: Resting left ankle-brachial index indicates mild left lower extremity arterial disease. The left toe-brachial index is abnormal. He has not been seen by vascular surgery despite these findings. He presented to clinic today in [name] and is using [name] to offload. Wound VAC was in place. Once this was removed, [name] large ulcer was identified on the left midfoot/ankle. Bone is frankly exposed. There is no malodorous or purulent drainage. There is some granulation tissue over the central portion of the exposed bone. There is [name] tunnel that extends posteriorly for roughly 10 cm. It has been discussed with him by multiple providers that he is at very high risk of losing his lower leg because of this wound. He is extremely eager to avoid this outcome and is here today to review his options as well as receive ongoing wound care. 09/03/2021: Here for reevaluation of his wound. There does not appear to have been any substantial improvement overall since our last visit. He has been in [name] with white foam overlying the exposed bone. We are working on getting him approved for hyperbaric oxygen therapy. 09/10/2021: We are in the process of getting him cleared to begin hyperbaric oxygen therapy. He still needs to obtain [name] chest x-ray. Although the wound measurements are roughly the same,  I think the overall appearance of the wound is better. The exposed bone has [name] bit more granulation tissue covering it. He has not received [name] vascular surgery appointment to reevaluate his flow to the wound. 09/17/2021: He has been approved for hyperbaric oxygen therapy and completed his chest x-ray, which I reviewed and it appears normal. The tunnels at the 12 and 10:00 positions are smaller. There is more granulation tissue covering the exposed bone and the undermining has decreased. He still has not received [name] vascular surgery appointment. 09/24/2021: He initiated hyperbaric oxygen therapy this week and is tolerating it well. He has an appointment with vascular surgery coming up on May 16. The granulation tissue is covering more of the exposed bone and both tunnels are [name] bit smaller. 10/01/2021: He continues to tolerate hyperbaric oxygen therapy. He saw infectious disease and they are planning to pull his PICC line. He has been initiated on oral antibiotics (doxycycline and Augmentin). The wound looks about the same but the tunnels are [name] little bit smaller. The skin seems to be contracting somewhat around the exposed bone. 10/08/2021: The wound is still about the same size, but the tunnels continue to come in and the skin is contracting around the exposed bone. He continues to have some accumulation of necrotic material in the inferoposterior aspect of the wound as well as accumulation at the 12:00 tunnel area. 10/15/2021: The wound is smaller today. The tunnels continue to come in. There is less necrotic tissue present. He does have some periwound maceration. 10/22/2021: The wound is about the same size. There is [name] little bit less undermining at the distal portion. The exposed bone is dark and I am not sure if this is staining from silver nitrate or his VAC sponge or if it represents necrosis. The tunnels are shallower but he does have some serous drainage coming from the 10:00 tunnel. He continues to  tolerate hyperbaric oxygen therapy well. 10/29/2021: The undermining continues to improve. The  tunnels are about the same. He has good granulation tissue overlying the majority of the exposed bone. Colin [name], Colin [name] ([id number]) [id number] Page 3 of 11 It does appear that perhaps the tubing from his wound VAC has been eroding the skin at the 12 clock position. He continues to accumulate senescent epithelium around the borders of the wound. 11/05/2021: The undermining is almost completely resolved. The tunnels have contracted fairly significantly. No significant slough or debris accumulation. There is still senescent epithelium accumulation around the borders of the wound. He has been tolerating hyperbaric oxygen therapy well. 11/12/2021: Despite the measurements of the wound being about the same, the wound has changed in its shape and overall, I think it is improved. The undermining has resolved and the tunnels continue to shorten. There is good granulation tissue encroaching over the small area of bone that has remained exposed at the 12 o'clock position. Minimal slough accumulation. He continues to tolerate hyperbaric oxygen therapy well. 11/19/2021: I took [name] PCR culture last week. There was overgrowth of yeast. He is already taking suppressive doxycycline and Augmentin. I added fluconazole to his regimen. The wound is smaller again today. The tunnels continue to shorten. He continues to do well with hyperbaric oxygen therapy. 11/26/2021: For some reason, his foot has become macerated. The wound is narrower but about the same dimensions in its longitudinal aspect. The tunnels continue to shorten. He has some slough buildup on the wound as well as some heaped up senescent epithelium around the perimeter. 12/03/2021: No further maceration of his foot has occurred. The wound has contracted quite significantly from last week. The tunnel at 10:00 is closed. The tunnel at 12:00 is down  to just [name] couple of millimeters. No other undermining is present. There is soft tissue coverage of the previously exposed bone. There is just [name] bit of slough and biofilm on the wound surface. 12/10/2021: The wound is looking good. It turns out the tunnel at 12:00 is only exposed when the patient dorsiflexes his foot. It is about 2 cm in depth when he does this; when his foot is in plantarflexion, the tunnel is closed. The bone that was visible at the 12:00 tunnel is completely covered with granulation tissue, but there does feel like some exposed bone deeper into the tunnel area. There is senescent skin heaped up around the periphery. Minimal slough on the wound surface. 12/16/2021: The wound dimensions are roughly the same. The surface has nice granulation tissue. The exposed bone at the 12:00 tunnel continues to be covered with more soft tissue. 7/14; patient's wound measures smaller today. Using the wound VAC with underlying collagen. He is also being treated with HBO for underlying osteomyelitis. He tells me he is on doxycycline and ampicillin follows with infectious disease next week 12/31/2021: The wound continues to contract. Unfortunately, the area where the track pad and tubing have been rubbing continues to look like it is applying friction. He says that the home health nurses that have been applying the [facility] have been putting gauze underneath the tubing, but nonetheless there is ongoing tissue breakdown at this site. Light slough accumulation on the wound surface. The tunnel continues to contract. He is tolerating HBO without difficulty. 01/07/2022: Bridging the wound VAC away from the ankle has resulted in significant improvement in the tissue at the apex of the wound. The tunnel is still present and is not all that much shorter, but the overall wound surface is very robust and healthy looking. Minimal slough accumulation. No  concern for acute infection. 01/21/2022: The wound continues to  contract and has [name] robust granulation tissue surface. The tunnel has come in considerably and is down to about 1.4 cm. There is still bone exposed within the tunnel but the rest of it is well covered. There is some senescent epithelium at the wound margins and [name] little bit of slough on the surface. 01/28/2022: No significant change in the wound this week, but there has not been any reaccumulation of senescent epithelium or slough. The tunnel is perhaps [name] millimeter less in depth. He has been approved for Apligraf and we will apply this today. 02/04/2022: The wound has contracted somewhat and the tunnel has filled in completely. The wound surface is clean. He is here for Apligraf #2. 02/11/2022: The wound has contracted further and is now nearly flush with the surrounding skin surface. Light layer of slough. Apligraf #3 plan for today. 02/18/2022: There is [name] band of epithelium trying to cut across the superior portion of the wound. There is robust granulation tissue with just [name] light layer of slough and biofilm on the surface. There was [name] little bit of greenish drainage in the wound VAC but none appreciated on the site itself. He is here for Apligraf #4. 03/04/2022: The wound has contracted considerably. There is good granulation tissue on the surface. Minimal biofilm. He is here for Apligraf #5. 03/18/2022: The wound has epithelialized to the point that it has been divided into 2 areas. Both areas are smaller in total than at his previous visit. There is good granulation tissue present with just [name] little bit of periwound eschar and surface slough. 10/13; left medial foot. The patient has completed treatment with hyperbaric oxygen for underlying osteomyelitis he has had [name] nice response in the wound. Noted today that he still had some greenish drainage. Previously he has been treated with topical gentamicin but apparently that was stopped 2 weeks ago. Fortunately the wound is measuring smaller 10/20; wound  looks better no hypergranulation minimal drainage. Granulation tissue looks healthy using gentamicin Hydrofera Blue under compression 04/08/2022: The wound continues to contract tremendously. The response to his treatment for hypertrophic granulation tissue has been quite remarkable. There are just [name] couple of open areas remaining. Electronic Signature(s) Signed: 04/08/2022 11:05:48 AM By: [name] Colin [name] Entered By: [name] on 04/08/2022 11:05:48 -------------------------------------------------------------------------------- Physical Exam Details Patient Name: Date of Service: NA Colin [name]. 04/08/2022 10:45 [name] Medical Record Number: [id number] Patient Account Number: [id number] Date of Birth/Sex: Treating RN: Sep 25, 1955 (66 y.o. M) Primary Care Provider: [name] Other Clinician: Referring Provider: Treating Provider/Extender: [name] in Treatment: [address] [place] [name] ([id number]) [id number] Page 4 of 11 Constitutional Hypertensive, asymptomatic. . . . No acute distress.Marland Kitchen Respiratory Normal work of breathing on room air.. Notes 04/08/2022: The wound continues to contract tremendously. The response to his treatment for hypertrophic granulation tissue has been quite remarkable. There are just [name] couple of open areas remaining. Electronic Signature(s) Signed: 04/08/2022 11:06:15 AM By: [name] Colin [name] Entered By: [name] on 04/08/2022 11:06:15 -------------------------------------------------------------------------------- Physician Orders Details Patient Name: Date of Service: NA Colin [name]. 04/08/2022 10:45 [name] Medical Record Number: [id number] Patient Account Number: [id number] Date of Birth/Sex: Treating RN: 10/04/1955 (66 y.o. 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 - Dr. Celine Ahr - room 2 - Return [name] Anesthetic Wound #1 Left,Medial Foot (In clinic) Topical Lidocaine 4% applied to wound bed Bathing/ Shower/ Hygiene May shower with protection but do not get wound dressing(s) wet. Edema Control - Lymphedema / SCD / Other Elevate legs to the level of the heart or above for 30 minutes daily and/or when sitting, [name] frequency of: Avoid standing for long periods of time. Wound Treatment Wound #1 - Foot Wound Laterality: Left, Medial Cleanser: Soap and Water 1 x Per Week/30 Days Discharge Instructions: May shower and wash wound with dial antibacterial soap and water prior to dressing change. Cleanser: Wound Cleanser 1 x Per Week/30 Days Discharge Instructions: Cleanse the wound with wound cleanser prior to applying [name] clean dressing using gauze sponges, not tissue or cotton balls. Peri-Wound Care: Sween Lotion (Moisturizing lotion) 1 x Per Week/30 Days Discharge Instructions: Apply moisturizing lotion as directed Topical: Gentamicin 1 x Per Week/30 Days Discharge Instructions: As directed by physician Prim Dressing: Hydrofera Blue Classic Foam, 4x4 in 1 x Per Week/30 Days ary Discharge Instructions: Moisten with saline prior to applying to wound bed Colin [name], Colin [name] ([id number]) [phone].pdf Page 5 of 11 Secondary Dressing: ABD Pad, 5x9 1 x Per Week/30 Days Discharge Instructions: Apply over primary dressing as directed. Secondary Dressing: Woven Gauze  Sponge, Non-Sterile 4x4 in 1 x Per Week/30 Days Discharge Instructions: Apply over primary dressing as directed. Secured With: Transpore Surgical Tape, 2x10 (in/yd) 1 x Per Week/30 Days Discharge Instructions: Secure dressing with tape as directed. Compression Wrap: Kerlix Roll 4.5x3.1 (in/yd) 1 x Per Week/30 Days Discharge Instructions: Apply Kerlix and Coban compression as directed. Compression Wrap: Coban Self-Adherent Wrap 4x5 (in/yd) 1 x Per Week/30 Days Discharge Instructions: Apply over Kerlix as directed. Patient Medications llergies: codeine [name] Notifications Medication Indication Start End 04/08/2022 lidocaine DOSE topical 4 % cream - cream topical Electronic Signature(s) Signed: 04/08/2022 12:04:20 PM By: [name] Colin [name] Entered By: [name] on 04/08/2022 11:06:24 -------------------------------------------------------------------------------- Problem List Details Patient Name: Date of Service: NA Colin [name]. 04/08/2022 10:45 [name] Medical Record Number: [id number] Patient Account Number: [id number] Date of Birth/Sex: Treating RN: [birth year] (67 y.o. 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 09/21/2021 No Yes [id number] Other chronic osteomyelitis, left ankle and foot 08/25/2021 No Yes E11.610 Type 2 diabetes mellitus with diabetic neuropathic arthropathy 08/25/2021 No Yes E11.621 Type 2 diabetes mellitus with foot ulcer 08/25/2021 No Yes I25.10 Atherosclerotic heart disease of native coronary artery without angina pectoris 08/25/2021 No Yes Inactive Problems Colin [name], Colin [name] ([id number]) [id number] Page 6 of 11 Resolved Problems Electronic Signature(s) Signed: 04/08/2022  11:05:01 AM By: [name] Colin [name] Entered By: [name] on 04/08/2022 11:05:00 -------------------------------------------------------------------------------- Progress Note Details Patient Name: Date of Service: NA Colin [name]. 04/08/2022 10:45 [name] Medical Record Number: [id number] Patient Account Number: [id number] Date of Birth/Sex: Treating RN: 08-Jan-1956 (66 y.o. M) Primary Care Provider: [name] Other Clinician: Referring Provider: Treating Provider/Extender: Celine Ahr  Annett Gula, Joylene Igo Weeks in Treatment: 32 Subjective Chief Complaint Information obtained from Patient Patients presents for treatment of an open diabetic ulcer with exposed bone and osteomyelitis History of Present Illness (HPI) ADMISSION 08/25/2021 This is [name] 66 year old [name] who initially presented to his primary care provider in September 2022 with pain in his left foot. He was sent for an x-ray and while the x-ray was being performed, the tech pointed out [name] wound on his foot that the patient was not aware existed. He does have type 2 diabetes with significant neuropathy. His diabetes is suboptimally controlled with his most recent A1c being 8.5. He also has [name] history of coronary artery disease status post three- vessel CABG. he was initially seen by orthopedics, but they referred him to [facility]. He has undergone at least 7 operations/debridements and several applications of skin substitute under the care of podiatry. He has been in [name] for much of this time. His most recent procedure was July 28, 2021. [name] was biopsied and was found to be consistent with osteomyelitis. Culture also returned positive for corynebacterium. He was seen on August 16, 2021 by infectious disease. [name] PICC line has been placed and he will be receiving [name] 6-week course of IV daptomycin and cefepime. In October 2022, he underwent lower extremity vascular studies. Results are  copied here: Right: Resting right ankle-brachial index is within normal range. No evidence of significant right lower extremity arterial disease. The right toe-brachial index is abnormal. Left: Resting left ankle-brachial index indicates mild left lower extremity arterial disease. The left toe-brachial index is abnormal. He has not been seen by vascular surgery despite these findings. He presented to clinic today in [name] and is using [name] to offload. Wound VAC was in place. Once this was removed, [name] large ulcer was identified on the left midfoot/ankle. Bone is frankly exposed. There is no malodorous or purulent drainage. There is some granulation tissue over the central portion of the exposed bone. There is [name] tunnel that extends posteriorly for roughly 10 cm. It has been discussed with him by multiple providers that he is at very high risk of losing his lower leg because of this wound. He is extremely eager to avoid this outcome and is here today to review his options as well as receive ongoing wound care. 09/03/2021: Here for reevaluation of his wound. There does not appear to have been any substantial improvement overall since our last visit. He has been in [name] with white foam overlying the exposed bone. We are working on getting him approved for hyperbaric oxygen therapy. 09/10/2021: We are in the process of getting him cleared to begin hyperbaric oxygen therapy. He still needs to obtain [name] chest x-ray. Although the wound measurements are roughly the same, I think the overall appearance of the wound is better. The exposed bone has [name] bit more granulation tissue covering it. He has not received [name] vascular surgery appointment to reevaluate his flow to the wound. 09/17/2021: He has been approved for hyperbaric oxygen therapy and completed his chest x-ray, which I reviewed and it appears normal. The tunnels at the 12 and 10:00 positions are smaller. There is more granulation tissue  covering the exposed bone and the undermining has decreased. He still has not received [name] vascular surgery appointment. 09/24/2021: He initiated hyperbaric oxygen therapy this week and is tolerating it well. He has an appointment with vascular surgery coming  up on May 16. The granulation tissue is covering more of the exposed bone and both tunnels are [name] bit smaller. 10/01/2021: He continues to tolerate hyperbaric oxygen therapy. He saw infectious disease and they are planning to pull his PICC line. He has been initiated on oral antibiotics (doxycycline and Augmentin). The wound looks about the same but the tunnels are [name] little bit smaller. The skin seems to be contracting somewhat around the exposed bone. 10/08/2021: The wound is still about the same size, but the tunnels continue to come in and the skin is contracting around the exposed bone. He continues to have some accumulation of necrotic material in the inferoposterior aspect of the wound as well as accumulation at the 12:00 tunnel area. 10/15/2021: The wound is smaller today. The tunnels continue to come in. There is less necrotic tissue present. He does have some periwound maceration. 10/22/2021: The wound is about the same size. There is [name] little bit less undermining at the distal portion. The exposed bone is dark and I am not sure if this is staining from silver nitrate or his VAC sponge or if it represents necrosis. The tunnels are shallower but he does have some serous drainage coming from the 10:00 tunnel. He continues to tolerate hyperbaric oxygen therapy well. Colin [name], Colin [name] ([id number]) [id number] Page 7 of 11 10/29/2021: The undermining continues to improve. The tunnels are about the same. He has good granulation tissue overlying the majority of the exposed bone. It does appear that perhaps the tubing from his wound VAC has been eroding the skin at the 12 clock position. He continues to accumulate  senescent epithelium around the borders of the wound. 11/05/2021: The undermining is almost completely resolved. The tunnels have contracted fairly significantly. No significant slough or debris accumulation. There is still senescent epithelium accumulation around the borders of the wound. He has been tolerating hyperbaric oxygen therapy well. 11/12/2021: Despite the measurements of the wound being about the same, the wound has changed in its shape and overall, I think it is improved. The undermining has resolved and the tunnels continue to shorten. There is good granulation tissue encroaching over the small area of bone that has remained exposed at the 12 o'clock position. Minimal slough accumulation. He continues to tolerate hyperbaric oxygen therapy well. 11/19/2021: I took [name] PCR culture last week. There was overgrowth of yeast. He is already taking suppressive doxycycline and Augmentin. I added fluconazole to his regimen. The wound is smaller again today. The tunnels continue to shorten. He continues to do well with hyperbaric oxygen therapy. 11/26/2021: For some reason, his foot has become macerated. The wound is narrower but about the same dimensions in its longitudinal aspect. The tunnels continue to shorten. He has some slough buildup on the wound as well as some heaped up senescent epithelium around the perimeter. 12/03/2021: No further maceration of his foot has occurred. The wound has contracted quite significantly from last week. The tunnel at 10:00 is closed. The tunnel at 12:00 is down to just [name] couple of millimeters. No other undermining is present. There is soft tissue coverage of the previously exposed bone. There is just [name] bit of slough and biofilm on the wound surface. 12/10/2021: The wound is looking good. It turns out the tunnel at 12:00 is only exposed when the patient dorsiflexes his foot. It is about 2 cm in depth when he does this; when his foot is in plantarflexion, the tunnel is  closed. The bone that was visible at  the 12:00 tunnel is completely covered with granulation tissue, but there does feel like some exposed bone deeper into the tunnel area. There is senescent skin heaped up around the periphery. Minimal slough on the wound surface. 12/16/2021: The wound dimensions are roughly the same. The surface has nice granulation tissue. The exposed bone at the 12:00 tunnel continues to be covered with more soft tissue. 7/14; patient's wound measures smaller today. Using the wound VAC with underlying collagen. He is also being treated with HBO for underlying osteomyelitis. He tells me he is on doxycycline and ampicillin follows with infectious disease next week 12/31/2021: The wound continues to contract. Unfortunately, the area where the track pad and tubing have been rubbing continues to look like it is applying friction. He says that the home health nurses that have been applying the [facility] have been putting gauze underneath the tubing, but nonetheless there is ongoing tissue breakdown at this site. Light slough accumulation on the wound surface. The tunnel continues to contract. He is tolerating HBO without difficulty. 01/07/2022: Bridging the wound VAC away from the ankle has resulted in significant improvement in the tissue at the apex of the wound. The tunnel is still present and is not all that much shorter, but the overall wound surface is very robust and healthy looking. Minimal slough accumulation. No concern for acute infection. 01/21/2022: The wound continues to contract and has [name] robust granulation tissue surface. The tunnel has come in considerably and is down to about 1.4 cm. There is still bone exposed within the tunnel but the rest of it is well covered. There is some senescent epithelium at the wound margins and [name] little bit of slough on the surface. 01/28/2022: No significant change in the wound this week, but there has not been any reaccumulation of senescent  epithelium or slough. The tunnel is perhaps [name] millimeter less in depth. He has been approved for Apligraf and we will apply this today. 02/04/2022: The wound has contracted somewhat and the tunnel has filled in completely. The wound surface is clean. He is here for Apligraf #2. 02/11/2022: The wound has contracted further and is now nearly flush with the surrounding skin surface. Light layer of slough. Apligraf #3 plan for today. 02/18/2022: There is [name] band of epithelium trying to cut across the superior portion of the wound. There is robust granulation tissue with just [name] light layer of slough and biofilm on the surface. There was [name] little bit of greenish drainage in the wound VAC but none appreciated on the site itself. He is here for Apligraf #4. 03/04/2022: The wound has contracted considerably. There is good granulation tissue on the surface. Minimal biofilm. He is here for Apligraf #5. 03/18/2022: The wound has epithelialized to the point that it has been divided into 2 areas. Both areas are smaller in total than at his previous visit. There is good granulation tissue present with just [name] little bit of periwound eschar and surface slough. 10/13; left medial foot. The patient has completed treatment with hyperbaric oxygen for underlying osteomyelitis he has had [name] nice response in the wound. Noted today that he still had some greenish drainage. Previously he has been treated with topical gentamicin but apparently that was stopped 2 weeks ago. Fortunately the wound is measuring smaller 10/20; wound looks better no hypergranulation minimal drainage. Granulation tissue looks healthy using gentamicin Hydrofera Blue under compression 04/08/2022: The wound continues to contract tremendously. The response to his treatment for hypertrophic granulation tissue has been  quite remarkable. There are just [name] couple of open areas remaining. Patient History Information obtained from Patient. Family History Cancer -  Father, Diabetes - Father,Mother,Paternal Grandparents, Heart Disease - Father, Hypertension - Father, No family history of Hereditary Spherocytosis, Kidney Disease, Lung Disease, Seizures, Stroke, Thyroid Problems, Tuberculosis. Social History Never smoker, Marital Status - Married, Alcohol Use - Rarely, Drug Use - No History, Caffeine Use - Daily. Medical History Eyes Patient has history of Cataracts - Removed 2008 Cardiovascular Patient has history of Coronary Artery Disease, Hypertension, Myocardial Infarction, Peripheral Arterial Disease Endocrine Patient has history of Type II Diabetes Musculoskeletal Patient has history of Osteomyelitis Neurologic Patient has history of Neuropathy Colin [name], Colin [name] ([id number]) [id number] Page 8 of 11 Medical [name] Surgical History Notes nd Cardiovascular Hypercholesterolemia Abnormal EKG CABG X3 2019 Gastrointestinal GERD Musculoskeletal Diabetic foot ulcer Objective Constitutional Hypertensive, asymptomatic. No acute distress.. Vitals Time Taken: 10:45 AM, Height: 74 in, Weight: 186 lbs, BMI: 23.9, Temperature: 98.0 F, Pulse: 80 bpm, Respiratory Rate: 20 breaths/min, Blood Pressure: 172/91 mmHg. Respiratory Normal work of breathing on room air.. General Notes: 04/08/2022: The wound continues to contract tremendously. The response to his treatment for hypertrophic granulation tissue has been quite remarkable. There are just [name] couple of open areas remaining. Integumentary (Hair, Skin) Wound #1 status is Open. Original cause of wound was Gradually Appeared. The date acquired was: 02/22/2021. The wound has been in treatment 32 weeks. The wound is located on the Left,Medial Foot. The wound measures 4.3cm length x 1cm width x 0.1cm depth; 3.377cm^2 area and 0.338cm^3 volume. There is Fat Layer (Subcutaneous Tissue) exposed. There is no tunneling or undermining noted. There is [name] medium amount of serosanguineous drainage  noted. The wound margin is distinct with the outline attached to the wound base. There is large (67-100%) red granulation within the wound bed. There is [name] small (1-33%) amount of necrotic tissue within the wound bed including Adherent Slough. The periwound skin appearance had no abnormalities noted for texture. The periwound skin appearance had no abnormalities noted for color. The periwound skin appearance exhibited: Dry/Scaly. The periwound skin appearance did not exhibit: Maceration. Periwound temperature was noted as No Abnormality. 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 There was [name] Selective/Open Wound Non-Viable Tissue Debridement with [name] total area of 4.73 sq cm performed by [name], Colin [name]. With the following instrument(s): Curette to remove Non-Viable tissue/material. Material removed includes [facility] after achieving pain control using Lidocaine 2% T opical Liquid. No specimens were taken. [name] time out was conducted at 11:02, prior to the start of the procedure. [name] Minimum amount of bleeding was controlled with Pressure. The procedure was tolerated well. Post Debridement Measurements: 4.3cm length x 1cm width x 0.1cm depth; 0.338cm^3 volume. Character of Wound/Ulcer Post Debridement is improved. Severity of Tissue Post Debridement is: Fat layer exposed. Post procedure Diagnosis Wound #1: Same as Pre-Procedure General Notes: scribed for Dr. Lady Gary by Samuella Bruin, RN. Plan Follow-up  Appointments: Return Appointment in 1 week. - Dr. Lady Gary - room 2 - Anesthetic: Wound #1 Left,Medial Foot: (In clinic) Topical Lidocaine 4% applied to wound  bed Bathing/ Shower/ Hygiene: May shower with protection but do not get wound dressing(s) wet. Colin [name], WILBERT [name] ([id number]) [id number] Page 9 of 11 Edema Control - Lymphedema / SCD / Other: Elevate legs to the level of the heart or above for 30 minutes daily and/or when sitting, [name] frequency of: Avoid standing for long periods of time. The following medication(s) was prescribed: lidocaine topical 4 % cream cream topical was prescribed at facility WOUND #1: - Foot Wound Laterality: Left, Medial Cleanser: Soap and Water 1 x Per Week/30 Days Discharge Instructions: May shower and wash wound with dial antibacterial soap and water prior to dressing change. Cleanser: Wound Cleanser 1 x Per Week/30 Days Discharge Instructions: Cleanse the wound with wound cleanser prior to applying [name] clean dressing using gauze sponges, not tissue or cotton balls. Peri-Wound Care: Sween Lotion (Moisturizing lotion) 1 x Per Week/30 Days Discharge Instructions: Apply moisturizing lotion as directed Topical: Gentamicin 1 x Per Week/30 Days Discharge Instructions: As directed by physician Prim Dressing: Hydrofera Blue Classic Foam, 4x4 in 1 x Per Week/30 Days ary Discharge Instructions: Moisten with saline prior to applying to wound bed Secondary Dressing: ABD Pad, 5x9 1 x Per Week/30 Days Discharge Instructions: Apply over primary dressing as directed. Secondary Dressing: Woven Gauze Sponge, Non-Sterile 4x4 in 1 x Per Week/30 Days Discharge Instructions: Apply over primary dressing as directed. Secured With: Transpore Surgical T ape, 2x10 (in/yd) 1 x Per Week/30 Days Discharge Instructions: Secure dressing with tape as directed. Com pression Wrap: Kerlix Roll 4.5x3.1 (in/yd) 1 x Per Week/30 Days Discharge Instructions: Apply Kerlix  and Coban compression as directed. Com pression Wrap: Coban Self-Adherent Wrap 4x5 (in/yd) 1 x Per Week/30 Days Discharge Instructions: Apply over Kerlix as directed. 04/08/2022: The wound continues to contract tremendously. The response to his treatment for hypertrophic granulation tissue has been quite remarkable. There are just [name] couple of open areas remaining. I used [name] curette to debride slough from the open portions of his wound. We will continue topical gentamicin and Hydrofera Blue. Follow-up in 1 week. Electronic Signature(s) Signed: 04/08/2022 11:07:28 AM By: [name] Colin [name] Entered By: [name] on 04/08/2022 11:07:28 -------------------------------------------------------------------------------- HxROS Details Patient Name: Date of Service: NA Colin [name]. 04/08/2022 10:45 [name] Medical Record Number: [id number] Patient Account Number: [id number] Date of Birth/Sex: Treating RN: [birth year] (66 y.o. M) Primary Care Provider: [name] Other Clinician: Referring Provider: Treating Provider/Extender: [name] Weeks in Treatment: 3 Information Obtained From Patient Eyes Medical History: Positive for: Cataracts - Removed 2008 Cardiovascular Medical History: Positive for: Coronary Artery Disease; Hypertension; Myocardial Infarction; Peripheral Arterial Disease Past Medical History Notes: Hypercholesterolemia Abnormal EKG CABG X3 2019 Gastrointestinal Medical History: Past Medical History Notes: GERD Endocrine Colin [name], Colin [name] ([id number]) [id number] Page 10 of 11 Medical History: Positive for: Type II Diabetes Time with diabetes: 24 years Treated with: Insulin, Oral agents Blood sugar tested every day: Yes Tested : Musculoskeletal Medical History: Positive for: Osteomyelitis Past Medical History Notes: Diabetic foot ulcer Neurologic Medical History: Positive for: Neuropathy HBO Extended History  Items Eyes: Cataracts Immunizations Pneumococcal Vaccine: Received Pneumococcal Vaccination: Yes Received Pneumococcal Vaccination On or After 60th Birthday: Yes Implantable Devices Yes Family and Social History Cancer: Yes - Father; Diabetes: Yes - Father,Mother,Paternal Grandparents; Heart Disease: Yes - Father; Hereditary Spherocytosis: No; Hypertension: Yes - Father; Kidney Disease: No; Lung Disease: No; Seizures: No; Stroke: No; Thyroid Problems: No; Tuberculosis: No; Never smoker; Marital Status - Married; Alcohol Use: Rarely; Drug Use:  No History; Caffeine Use: Daily; Financial Concerns: No; Food, Clothing or Shelter Needs: No; Support System Lacking: No; Transportation Concerns: No Electronic Signature(s) Signed: 04/08/2022 12:04:20 PM By: [name] Colin [name] Entered By: [name] on 04/08/2022 11:05:54 -------------------------------------------------------------------------------- SuperBill Details Patient Name: Date of Service: NA Colin [name]. 04/08/2022 Medical Record Number: [id number] Patient Account Number: [id number] Date of Birth/Sex: Treating RN: 06-Feb-1956 (66 y.o. 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 M86.672 Other chronic osteomyelitis, left ankle and foot E11.610 Type 2 diabetes mellitus with diabetic neuropathic arthropathy E11.621 Type 2 diabetes mellitus with foot ulcer I25.10 Atherosclerotic heart disease of native coronary artery without angina pectoris Facility Procedures : DAMICHAEL, HOFMAN Code: [id number] Yahshua [name] ([id number]) ICD-1 L Description: 64332 - DEBRIDE WOUND 1ST 20 SQ CM OR < ICD-10 Diagnosis Description [phone] Diagnosis Description 97.324 Non-pressure chronic ulcer of left ankle with  necrosis of bone Modifier: [id number] Quantity: 1 .pdf Page 11 of 11 Physician Procedures : CPT4 Code Description Modifier [id number] - WC PHYS LEVEL 4 - EST PT 25 ICD-10 Diagnosis Description L97.324 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

## 2022-04-08 NOTE — Progress Notes (Signed)
Colin Colin, Colin Colin ([id number]) [id number] Page 1 of 7 Visit Report for 04/08/2022 Arrival Information Details Patient Name: Date of Service: NA Colin Colin 04/08/2022 10:45 Colin Colin Medical Record Number: [id number] Patient Account Number: [id number] Date of Birth/Sex: Treating RN: 1956/01/19 (66 y.o. [name] Primary Care Lashala Laser: [name] Other Clinician: Referring Maia Handa: Treating Brea Coleson/Extender: [name] in Treatment: 32 Visit Information History Since Last Visit Added or deleted any medications: No Patient Arrived: Knee Scooter Any new allergies or adverse reactions: No Arrival Time: 10:49 Had Colin fall or experienced change in No Accompanied By: self activities of daily living that may affect Transfer Assistance: None risk of falls: Patient Identification Verified: Yes Signs or symptoms of abuse/neglect since last visito No Secondary Verification Process Completed: Yes Hospitalized since last visit: No Patient Requires Transmission-Based Precautions: No Implantable device outside of the clinic excluding No Patient Has Alerts: Yes cellular tissue based products placed in the center Patient Alerts: Patient on Blood Thinner since last visit: Has Dressing in Place as Prescribed: Yes Has Compression in Place as Prescribed: Yes Pain Present Now: No Electronic Signature(s) Signed: 04/08/2022 4:25:07 PM By: Samuella Bruin Entered By: Samuella Bruin on 04/08/2022 10:55:07 -------------------------------------------------------------------------------- Encounter Discharge Information Details Patient Name: Date of Service: NA Colin Colin. 04/08/2022 10:45 Colin Colin Medical Record Number: [id number] Patient Account Number: [id number] Date of Birth/Sex: Treating RN: 1956/04/17 (67 y.o. [name] Primary Care Salinda Snedeker: [name] Other Clinician: Referring Cherese Lozano: Treating Shadiamond Koska/Extender:  [name] in Treatment: 32 Encounter Discharge Information Items Post Procedure Vitals Discharge Condition: Stable Temperature (F): 98 Ambulatory Status: Knee Scooter Pulse (bpm): 80 Discharge Destination: Home Respiratory Rate (breaths/min): 20 Transportation: Private Auto Blood Pressure (mmHg): 172/91 Accompanied By: self Schedule Follow-up Appointment: Yes Clinical Summary of Care: Patient Declined Electronic Signature(s) Signed: 04/08/2022 4:25:07 PM By: Samuella Bruin Entered By: Samuella Bruin on 04/08/2022 11:35:16 Colin Colin ([id number]) [id number] Page 2 of 7 -------------------------------------------------------------------------------- Lower Extremity Assessment Details Patient Name: Date of Service: Colin Colin Colin. 04/08/2022 10:45 Colin Colin Medical Record Number: [id number] Patient Account Number: [id number] Date of Birth/Sex: Treating RN: April 09, 1956 (66 y.o. [name] Primary Care Ashunti Schofield: [name] Other Clinician: Referring Kion Huntsberry: Treating Berkley Cronkright/Extender: [name]: 32 Edema Assessment Assessed: [name]: No] [Right: No] Edema: [Left: Ye] [Right: s] Calf Left: Right: Point of Measurement: From Medial Instep 33.2 cm Ankle Left: Right: Point of Measurement: From Medial Instep 21.2 cm Vascular Assessment Pulses: Dorsalis Pedis Palpable: [Left:Yes] Electronic Signature(s) Signed: 04/08/2022 4:25:07 PM By: Samuella Bruin Entered By: Samuella Bruin on 04/08/2022 10:53:01 -------------------------------------------------------------------------------- Multi Wound Chart Details Patient Name: Date of Service: Colin Colin Colin. 04/08/2022 10:45 Colin Colin Medical Record Number: [id number] Patient Account Number: [id number] Date of Birth/Sex: Treating RN: 10-Dec-1955 (66 y.o. [name]) Primary Care Krystena Reitter: [name] Other Clinician: Referring  Jacqualyn Sedgwick: Treating Haneefah Venturini/Extender: [name]: 32 Vital Signs Height(in): 74 Pulse(bpm): 80 Weight(lbs): 186 Blood Pressure(mmHg): 172/91 Body Mass Index(BMI): 23.9 Temperature(F): 98.0 Respiratory Rate(breaths/min): 20 [1:Photos:] [Colin/Colin:Colin/Colin] Left, Medial Foot Colin/Colin Colin/Colin Wound Location: Gradually Appeared Colin/Colin Colin/Colin Wounding Event: Diabetic Wound/Ulcer of the Lower Colin/Colin Colin/Colin Primary Etiology: Extremity Cataracts, Coronary Artery Disease, Colin/Colin Colin/Colin Comorbid History: Hypertension, Myocardial Infarction, Peripheral Arterial Disease, Type II Diabetes, Osteomyelitis, Neuropathy 02/22/2021 Colin/Colin Colin/Colin Date Acquired: 57 Colin/Colin Colin/Colin Weeks of Treatment: Open Colin/Colin Colin/Colin Wound Status: No Colin/Colin Colin/Colin Wound Recurrence: 4.3x1x0.1 Colin/Colin  Colin/Colin Measurements L x W x D (cm) 3.377 Colin/Colin Colin/Colin Colin (cm) : rea 0.338 Colin/Colin Colin/Colin Volume (cm) : 76.10% Colin/Colin Colin/Colin % Reduction in Colin rea: 97.60% Colin/Colin Colin/Colin % Reduction in Volume: Grade 3 Colin/Colin Colin/Colin Classification: Medium Colin/Colin Colin/Colin Exudate Colin mount: Serosanguineous Colin/Colin Colin/Colin Exudate Type: red, brown Colin/Colin Colin/Colin Exudate Color: Distinct, outline attached Colin/Colin Colin/Colin Wound Margin: Large (67-100%) Colin/Colin Colin/Colin Granulation Colin mount: Red Colin/Colin Colin/Colin Granulation Quality: Small (1-33%) Colin/Colin Colin/Colin Necrotic Colin mount: Fat Layer (Subcutaneous Tissue): Yes Colin/Colin Colin/Colin Exposed Structures: Fascia: No Tendon: No Muscle: No Joint: No Bone: No Medium (34-66%) Colin/Colin Colin/Colin Epithelialization: Debridement - Selective/Open Wound Colin/Colin Colin/Colin Debridement: Pre-procedure Verification/Time Out 11:02 Colin/Colin Colin/Colin Taken: Lidocaine 2% Topical Liquid Colin/Colin Colin/Colin Pain Control: Slough Colin/Colin Colin/Colin Tissue Debrided: Non-Viable Tissue Colin/Colin Colin/Colin Level: 4.73 Colin/Colin Colin/Colin Debridement Colin (sq cm): rea Curette Colin/Colin Colin/Colin Instrument: Minimum Colin/Colin Colin/Colin Bleeding: Pressure Colin/Colin Colin/Colin Hemostasis Colin chieved: Procedure was tolerated well Colin/Colin Colin/Colin Debridement Treatment Response: 4.3x1x0.1 Colin/Colin Colin/Colin Post Debridement Measurements L x W x  D (cm) 0.338 Colin/Colin Colin/Colin Post Debridement Volume: (cm) Excoriation: No Colin/Colin Colin/Colin Periwound Skin Texture: Induration: No Callus: No Crepitus: No Rash: No Scarring: No Dry/Scaly: Yes Colin/Colin Colin/Colin Periwound Skin Moisture: Maceration: No No Abnormalities Noted Colin/Colin Colin/Colin Periwound Skin Color: No Abnormality Colin/Colin Colin/Colin Temperature: Debridement Colin/Colin Colin/Colin Procedures Performed: Treatment Notes Electronic Signature(s) Signed: 04/08/2022 11:05:07 AM By: [name] MD FACS Entered By: [name] on 04/08/2022 11:05:07 -------------------------------------------------------------------------------- Multi-Disciplinary Care Plan Details Patient Name: Date of Service: Colin Loveless Colin. 04/08/2022 10:45 Colin Colin Medical Record Number: [id number] Patient Account Number: [id number] Date of Birth/Sex: Treating RN: [birth year] (66 y.o. [name] Primary Care Aylani Spurlock: [name] Other Clinician: Referring Arcangel Minion: Treating Micca Matura/Extender: [name] in Treatment: [address] Darrien Colin ([id number]) [id number] Page 4 of 7 Multidisciplinary Care Plan reviewed with physician Active Inactive Nutrition Nursing Diagnoses: Impaired glucose control: actual or potential Goals: Patient/caregiver verbalizes understanding of need to maintain therapeutic glucose control per primary care physician Date Initiated: 08/25/2021 Target Resolution Date: 05/13/2022 Goal Status: Active Interventions: Assess HgA1c results as ordered upon admission and as needed Provide education on elevated blood sugars and impact on wound healing Notes: Osteomyelitis Nursing Diagnoses: Infection: osteomyelitis Knowledge deficit related to disease process and management Goals: Patient's osteomyelitis will resolve Date Initiated: 09/17/2021 Target Resolution Date: 05/13/2022 Goal Status: Active Interventions: Assess for signs and symptoms of osteomyelitis resolution every  visit Provide education on osteomyelitis Treatment Activities: Surgical debridement : 09/17/2021 Systemic antibiotics : 09/17/2021 T ordered outside of clinic : 09/17/2021 est Notes: Electronic Signature(s) Signed: 04/08/2022 4:25:07 PM By: Adline Peals Entered By: Adline Peals on 04/08/2022 11:32:41 -------------------------------------------------------------------------------- Pain Assessment Details Patient Name: Date of Service: Colin Loveless Colin. 04/08/2022 10:45 Colin Colin Medical Record Number: [id number] Patient Account Number: [id number] Date of Birth/Sex: Treating RN: [birth year] (66 y.o. [name] Primary Care Dazia Lippold: [name] Other Clinician: Referring Chellie Vanlue: Treating Tifanie Gardiner/Extender: [name] Weeks in Treatment: 32 Active Problems Location of Pain Severity and Description of Pain Patient Has Paino No Site Locations Rate the pain. Colin Colin, Colin Colin ([id number]) [id number] Page 5 of 7 Rate the pain. Current Pain Level: 0 Pain Management and Medication Current Pain Management: Electronic Signature(s) Signed: 04/08/2022 4:25:07 PM By: Adline Peals Entered By: Adline Peals on 04/08/2022 10:50:53 -------------------------------------------------------------------------------- Patient/Caregiver Education Details Patient Name: Date of Service: NA Colin Colin, Colin Colin Colin. 10/27/2023andnbsp10:45 Colin Colin Medical  Record Number: [id number] Patient Account Number: [id number] Date of Birth/Gender: Treating RN: 01/16/1956 (66 y.o. [name] Primary Care Physician: [name] Other Clinician: Referring Physician: Treating Physician/Extender: [name] in Treatment: 32 Education Assessment Education Provided To: Patient Education Topics Provided Wound/Skin Impairment: Methods: Explain/Verbal Responses: Reinforcements needed, State content correctly Electronic  Signature(s) Signed: 04/08/2022 4:25:07 PM By: Samuella Bruin Entered By: Samuella Bruin on 04/08/2022 11:33:00 -------------------------------------------------------------------------------- Wound Assessment Details Patient Name: Date of Service: Colin Colin Colin. 04/08/2022 10:45 Colin Colin Medical Record Number: [id number] Patient Account Number: [id number] Date of Birth/Sex: Treating RN: 04/16/1956 (66 y.o. [name] Primary Care Reshonda Koerber: [name] Other Clinician: Referring Edsel Shives: Treating Tangy Drozdowski/Extender: Uriel, Dowding Colin ([id number]) [id number] Page 6 of 7 Weeks in Treatment: 32 Wound Status Wound Number: 1 Primary Diabetic Wound/Ulcer of the Lower Extremity Etiology: Wound Location: Left, Medial Foot Wound Open Wounding Event: Gradually Appeared Status: Date Acquired: 02/22/2021 Comorbid Cataracts, Coronary Artery Disease, Hypertension, Myocardial Weeks Of Treatment: 32 History: Infarction, Peripheral Arterial Disease, Type II Diabetes, Clustered Wound: No Osteomyelitis, Neuropathy Photos Wound Measurements Length: (cm) 4.3 Width: (cm) 1 Depth: (cm) 0.1 Area: (cm) 3.377 Volume: (cm) 0.338 % Reduction in Area: 76.1% % Reduction in Volume: 97.6% Epithelialization: Medium (34-66%) Tunneling: No Undermining: No Wound Description Classification: Grade 3 Wound Margin: Distinct, outline attached Exudate Amount: Medium Exudate Type: Serosanguineous Exudate Color: red, brown Foul Odor After Cleansing: No Slough/Fibrino Yes Wound Bed Granulation Amount: Large (67-100%) Exposed Structure Granulation Quality: Red Fascia Exposed: No Necrotic Amount: Small (1-33%) Fat Layer (Subcutaneous Tissue) Exposed: Yes Necrotic Quality: Adherent Slough Tendon Exposed: No Muscle Exposed: No Joint Exposed: No Bone Exposed: No Periwound Skin Texture Texture Color No Abnormalities Noted: Yes No  Abnormalities Noted: Yes Moisture Temperature / Pain No Abnormalities Noted: No Temperature: No Abnormality Dry / Scaly: Yes Maceration: No Treatment Notes Wound #1 (Foot) Wound Laterality: Left, Medial Cleanser Soap and Water Discharge Instruction: May shower and wash wound with dial antibacterial soap and water prior to dressing change. Wound Cleanser Discharge Instruction: Cleanse the wound with wound cleanser prior to applying Colin clean dressing using gauze sponges, not tissue or cotton balls. Peri-Wound Care Sween Lotion (Moisturizing lotion) Discharge Instruction: Apply moisturizing lotion as directed Topical Gentamicin Discharge Instruction: As directed by physician Colin Colin, Colin Colin ([id number]) [id number] Page 7 of 7 Primary Dressing Hydrofera Blue Classic Foam, 4x4 in Discharge Instruction: Moisten with saline prior to applying to wound bed Secondary Dressing ABD Pad, 5x9 Discharge Instruction: Apply over primary dressing as directed. Woven Gauze Sponge, Non-Sterile 4x4 in Discharge Instruction: Apply over primary dressing as directed. Secured With Transpore Surgical Tape, 2x10 (in/yd) Discharge Instruction: Secure dressing with tape as directed. Compression Wrap Kerlix Roll 4.5x3.1 (in/yd) Discharge Instruction: Apply Kerlix and Coban compression as directed. Coban Self-Adherent Wrap 4x5 (in/yd) Discharge Instruction: Apply over Kerlix as directed. Compression Stockings Add-Ons Electronic Signature(s) Signed: 04/08/2022 4:25:07 PM By: Samuella Bruin Entered By: Samuella Bruin on 04/08/2022 10:57:23 -------------------------------------------------------------------------------- Vitals Details Patient Name: Date of Service: NA Colin [name] Colin. 04/08/2022 10:45 Colin Colin Medical Record Number: [id number] Patient Account Number: [id number] Date of Birth/Sex: Treating RN: [birth year] (66 y.o. [name] Primary Care Paden Senger:  [name] Other Clinician: Referring Jakai Onofre: Treating Mysty Kielty/Extender: [name]: 32 Vital Signs Time Taken: 10:45 Temperature (F): 98.0 Height (in): 74 Pulse (bpm): 80 Weight (lbs): 186 Respiratory Rate (breaths/min): 20 Body Mass Index (BMI): 23.9 Blood Pressure (  mmHg): 172/91 Reference Range: 80 - 120 mg / dl Electronic Signature(s) Signed: 04/08/2022 4:25:07 PM By: Samuella Bruin Entered By: Samuella Bruin on 04/08/2022 10:55:58

## 2022-04-14 ENCOUNTER — Other Ambulatory Visit: Payer: Self-pay | Admitting: Internal Medicine

## 2022-04-14 DIAGNOSIS — M86472 Chronic osteomyelitis with draining sinus, left ankle and foot: Secondary | ICD-10-CM

## 2022-04-14 MED ORDER — AMOXICILLIN-POT CLAVULANATE 875-125 MG PO TABS: 1.0000 | ORAL_TABLET | Freq: Two times a day (BID) | ORAL | 2 refills | Status: DC

## 2022-04-14 NOTE — Telephone Encounter (Signed)
Only received a request for doxy, patient should be on Augmentin as well correct? Thanks!

## 2022-04-15 ENCOUNTER — Telehealth: Payer: Self-pay

## 2022-04-15 NOTE — Patient Outreach (Signed)
  Care Coordination   04/15/2022 Name: [name] MRN: [id number] DOB: 1955/09/28   Care Coordination Outreach Attempts:  A second unsuccessful outreach was attempted today to offer the patient with information about available care coordination services as a benefit of their health plan.     Follow Up Plan:  Additional outreach attempts will be made to offer the patient care coordination information and services.   Encounter Outcome:  No Answer  Care Coordination Interventions Activated:  No   Care Coordination Interventions:  No, not indicated    Tomasa Rand, RN, BSN, CEN [facility] [employer] [phone]

## 2022-04-18 ENCOUNTER — Encounter (HOSPITAL_BASED_OUTPATIENT_CLINIC_OR_DEPARTMENT_OTHER): Payer: BC Managed Care – PPO | Attending: General Surgery | Admitting: General Surgery

## 2022-04-18 ENCOUNTER — Telehealth: Payer: Self-pay

## 2022-04-18 NOTE — Patient Outreach (Signed)
  Care Coordination   04/18/2022 Name: [name] MRN: [id number] DOB: 1955-08-28   Care Coordination Outreach Attempts:  A third unsuccessful outreach was attempted today to offer the patient with information about available care coordination services as a benefit of their health plan.   Follow Up Plan:  No further outreach attempts will be made at this time. We have been unable to contact the patient to offer or enroll patient in care coordination services  Encounter Outcome:  No Answer  Care Coordination Interventions Activated:  No   Care Coordination Interventions:  No, not indicated    Tomasa Rand, RN, BSN, CEN [place] Coordinator [phone]

## 2022-04-18 NOTE — Progress Notes (Signed)
HARNOOR, KOHLES A ([id number]) [id number] Page 1 of 6 Visit Report for 04/18/2022 Arrival Information Details Patient Name: Date of Service: NA [name] 04/18/2022 10:45 A M Medical Record Number: [id number] Patient Account Number: [id number] Date of Birth/Sex: Treating RN: 1955/11/20 (66 y.o. [name] Primary Care Donalyn Schneeberger: [name] Other Clinician: Referring Melody Savidge: Treating Anees Vanecek/Extender: [name] in Treatment: 33 Visit Information History Since Last Visit Added or deleted any medications: No Patient Arrived: Knee Scooter Any new allergies or adverse reactions: No Arrival Time: 10:43 Had a fall or experienced change in No Accompanied By: self activities of daily living that may affect Transfer Assistance: None risk of falls: Patient Identification Verified: Yes Signs or symptoms of abuse/neglect since last visito No Secondary Verification Process Completed: Yes Hospitalized since last visit: No Patient Requires Transmission-Based Precautions: No Implantable device outside of the clinic excluding No Patient Has Alerts: Yes cellular tissue based products placed in the center Patient Alerts: Patient on Blood Thinner since last visit: Has Dressing in Place as Prescribed: Yes Has Compression in Place as Prescribed: Yes Pain Present Now: No Electronic Signature(s) Signed: 04/18/2022 4:30:42 PM By: Samuella Bruin Entered By: Samuella Bruin on 04/18/2022 10:43:53 -------------------------------------------------------------------------------- Encounter Discharge Information Details Patient Name: Date of Service: NA Colin Nephew A. 04/18/2022 10:45 A M Medical Record Number: [id number] Patient Account Number: [id number] Date of Birth/Sex: Treating RN: 1955/06/28 (66 y.o. [name] Primary Care Shoua Ulloa: [name] Other Clinician: Referring Mckena Chern: Treating Tonnie Friedel/Extender:  [name] in Treatment: 33 Encounter Discharge Information Items Discharge Condition: Stable Ambulatory Status: Knee Scooter Discharge Destination: Home Transportation: Private Auto Accompanied By: self Schedule Follow-up Appointment: Yes Clinical Summary of Care: Patient Declined Electronic Signature(s) Signed: 04/18/2022 4:30:42 PM By: Samuella Bruin Entered By: Samuella Bruin on 04/18/2022 11:08:06 Colin Beech A ([id number]) [id number] Page 2 of 6 -------------------------------------------------------------------------------- Lower Extremity Assessment Details Patient Name: Date of Service: Colin Fanning A. 04/18/2022 10:45 A M Medical Record Number: [id number] Patient Account Number: [id number] Date of Birth/Sex: Treating RN: [birth year] (66 y.o. [name] Primary Care Male Minish: [name] Other Clinician: Referring Blossie Raffel: Treating Dezmon Conover/Extender: [name]: 33 Edema Assessment Assessed: [Left: No] [Right: No] Edema: [Left: Ye] [Right: s] Calf Left: Right: Point of Measurement: From Medial Instep 33.3 cm Ankle Left: Right: Point of Measurement: From Medial Instep 22 cm Vascular Assessment Pulses: Dorsalis Pedis Palpable: [Left:Yes] Electronic Signature(s) Signed: 04/18/2022 4:30:42 PM By: Samuella Bruin Entered By: Samuella Bruin on 04/18/2022 10:49:41 -------------------------------------------------------------------------------- Multi Wound Chart Details Patient Name: Date of Service: NA Colin Nephew A. 04/18/2022 10:45 A M Medical Record Number: [id number] Patient Account Number: [id number] Date of Birth/Sex: Treating RN: [birth year] (66 y.o. M) Primary Care Syaire Saber: [name] Other Clinician: Referring Gyasi Hazzard: Treating Early Steel/Extender: [name]: 33 Vital Signs Height(in): 74 Pulse(bpm):  69 Weight(lbs): 186 Blood Pressure(mmHg): 152/86 Body Mass Index(BMI): 23.9 Temperature(F): 97.5 Respiratory Rate(breaths/min): 20 [1:Photos:] [N/A:N/A] Left, Medial Foot N/A N/A Wound Location: Gradually Appeared N/A N/A Wounding Event: Diabetic Wound/Ulcer of the Lower N/A N/A Primary Etiology: Extremity Cataracts, Coronary Artery Disease, N/A N/A Comorbid History: Hypertension, Myocardial Infarction, Peripheral Arterial Disease, Type II Diabetes, Osteomyelitis, Neuropathy 02/22/2021 N/A N/A Date Acquired: 5 N/A N/A Weeks of Treatment: Open N/A N/A Wound Status: No N/A N/A Wound Recurrence: 0x0x0 N/A N/A Measurements L x W x D (cm) 0 N/A N/A A (cm) : rea 0 N/A  N/A Volume (cm) : 100.00% N/A N/A % Reduction in A rea: 100.00% N/A N/A % Reduction in Volume: Grade 3 N/A N/A Classification: None Present N/A N/A Exudate A mount: Distinct, outline attached N/A N/A Wound Margin: None Present (0%) N/A N/A Granulation A mount: None Present (0%) N/A N/A Necrotic A mount: Fascia: No N/A N/A Exposed Structures: Fat Layer (Subcutaneous Tissue): No Tendon: No Muscle: No Joint: No Bone: No Large (67-100%) N/A N/A Epithelialization: Excoriation: No N/A N/A Periwound Skin Texture: Induration: No Callus: No Crepitus: No Rash: No Scarring: No Dry/Scaly: Yes N/A N/A Periwound Skin Moisture: Maceration: No No Abnormalities Noted N/A N/A Periwound Skin Color: No Abnormality N/A N/A Temperature: Treatment Notes Wound #1 (Foot) Wound Laterality: Left, Medial Cleanser Peri-Wound Care Topical Primary Dressing Secondary Dressing Secured With Compression Wrap Compression Stockings Add-Ons Electronic Signature(s) Signed: 04/18/2022 11:39:05 AM By: [name] MD FACS Entered By: [name] on 04/18/2022 11:39:05 -------------------------------------------------------------------------------- Multi-Disciplinary Care Plan Details Patient Name:  Date of Service: Colin Fanning A. 04/18/2022 10:45 A M Medical Record Number: [id number] Patient Account Number: [id number] Date of Birth/Sex: Treating RN: 08/06/1955 (66 y.o. [name] Primary Care Stepfon Rawles: [name] Other Clinician: Referring Tierre Gerard: Treating Ina Scrivens/Extender: [name], Colin A ([id number]) [id number] Page 4 of 6 Weeks in Treatment: 33 Multidisciplinary Care Plan reviewed with physician Active Inactive Electronic Signature(s) Signed: 04/18/2022 4:30:42 PM By: Samuella Bruin Entered By: Samuella Bruin on 04/18/2022 11:07:25 -------------------------------------------------------------------------------- Pain Assessment Details Patient Name: Date of Service: Colin Fanning A. 04/18/2022 10:45 A M Medical Record Number: [id number] Patient Account Number: [id number] Date of Birth/Sex: Treating RN: May 31, 1956 (66 y.o. [name] Primary Care Alois Colgan: [name] Other Clinician: Referring Susano Cleckler: Treating Raelyn Racette/Extender: [name] in Treatment: 33 Active Problems Location of Pain Severity and Description of Pain Patient Has Paino No Site Locations Rate the pain. Current Pain Level: 0 Pain Management and Medication Current Pain Management: Electronic Signature(s) Signed: 04/18/2022 4:30:42 PM By: Samuella Bruin Entered By: Samuella Bruin on 04/18/2022 10:44:35 -------------------------------------------------------------------------------- Patient/Caregiver Education Details Patient Name: Date of Service: NA Colin Nephew A. 11/6/2023andnbsp10:45 A M Medical Record Number: [id number] Patient Account Number: [id number] Date of Birth/Gender: Treating RN: 1955-08-03 (66 y.o. [name] Primary Care Physician: [name] Other Clinician: ULYSES, Colin A ([id number]) [id number] Page 5 of 6 Referring  Physician: Treating Physician/Extender: [name] in Treatment: 33 Education Assessment Education Provided To: Patient Education Topics Provided Safety: Methods: Explain/Verbal Responses: Reinforcements needed, State content correctly Electronic Signature(s) Signed: 04/18/2022 4:30:42 PM By: Samuella Bruin Entered By: Samuella Bruin on 04/18/2022 11:07:43 -------------------------------------------------------------------------------- Wound Assessment Details Patient Name: Date of Service: Colin Fanning A. 04/18/2022 10:45 A M Medical Record Number: [id number] Patient Account Number: [id number] Date of Birth/Sex: Treating RN: [birth year] (66 y.o. [name] Primary Care Nirvaan Frett: [name] Other Clinician: Referring Elisha Cooksey: Treating Zephyra Bernardi/Extender: [name]: 33 Wound Status Wound Number: 1 Primary Diabetic Wound/Ulcer of the Lower Extremity Etiology: Wound Location: Left, Medial Foot Wound Open Wounding Event: Gradually Appeared Status: Date Acquired: 02/22/2021 Comorbid Cataracts, Coronary Artery Disease, Hypertension, Myocardial Weeks Of Treatment: 33 History: Infarction, Peripheral Arterial Disease, Type II Diabetes, Clustered Wound: No Osteomyelitis, Neuropathy Photos Wound Measurements Length: (cm) Width: (cm) Depth: (cm) Area: (cm) Volume: (cm) 0 % Reduction in Area: 100% 0 % Reduction in Volume: 100% 0 Epithelialization: Large (67-100%) 0 Tunneling: No 0 Undermining: No Wound Description  Classification: Grade 3 Wound Margin: Distinct, outline attached Exudate Amount: None Present Foul Odor After Cleansing: No Slough/Fibrino No Wound Bed Granulation Amount: None Present (0%) Exposed Structure [name], Colin A ([id number]) [id number] Page 6 of 6 Necrotic Amount: None Present (0%) Fascia Exposed: No Fat Layer (Subcutaneous Tissue) Exposed:  No Tendon Exposed: No Muscle Exposed: No Joint Exposed: No Bone Exposed: No Periwound Skin Texture Texture Color No Abnormalities Noted: Yes No Abnormalities Noted: Yes Moisture Temperature / Pain No Abnormalities Noted: No Temperature: No Abnormality Dry / Scaly: Yes Maceration: No Electronic Signature(s) Signed: 04/18/2022 4:30:42 PM By: Adline Peals Signed: 04/18/2022 4:54:31 PM By: [name] RN, BSN Entered By: [name] on 04/18/2022 10:50:38 -------------------------------------------------------------------------------- Vitals Details Patient Name: Date of Service: NA Colin Colin Sportsman A. 04/18/2022 10:45 A M Medical Record Number: [id number] Patient Account Number: [id number] Date of Birth/Sex: Treating RN: December 10, 1955 (66 y.o. [name] Primary Care Oksana Deberry: [name] Other Clinician: Referring Rissie Sculley: Treating Cassian Torelli/Extender: [name] Weeks in Treatment: 33 Vital Signs Time Taken: 10:43 Temperature (F): 97.5 Height (in): 74 Pulse (bpm): 69 Weight (lbs): 186 Respiratory Rate (breaths/min): 20 Body Mass Index (BMI): 23.9 Blood Pressure (mmHg): 152/86 Reference Range: 80 - 120 mg / dl Electronic Signature(s) Signed: 04/18/2022 4:30:42 PM By: Adline Peals Entered By: Adline Peals on 04/18/2022 10:44:28

## 2022-04-18 NOTE — Progress Notes (Signed)
JAHZIER, VILLALON A ([id number]) [id number] Page 1 of 9 Visit Report for 04/18/2022 Chief Complaint Document Details Patient Name: Date of Service: NA [name] 04/18/2022 10:45 A M Medical Record Number: [id number] Patient Account Number: [id number] Date of Birth/Sex: Treating RN: 07-May-1956 (66 y.o. M) Primary Care Provider: [name] Other Clinician: Referring Provider: Treating Provider/Extender: [name] in Treatment: 33 Information Obtained from: Patient Chief Complaint Patients presents for treatment of an open diabetic ulcer with exposed bone and osteomyelitis Electronic Signature(s) Signed: 04/18/2022 11:39:11 AM By: [name] MD FACS Entered By: [name] on 04/18/2022 11:39:11 -------------------------------------------------------------------------------- HPI Details Patient Name: Date of Service: NA [name] 04/18/2022 10:45 A M Medical Record Number: [id number] Patient Account Number: [id number] Date of Birth/Sex: Treating RN: [birth year] (66 y.o. M) Primary Care Provider: [name] Other Clinician: Referring Provider: Treating Provider/Extender: [name] in Treatment: 33 History of Present Illness HPI Description: ADMISSION 08/25/2021 This is a 66 year old man who initially presented to his primary care provider in September 2022 with pain in his left foot. He was sent for an x-ray and while the x-ray was being performed, the tech pointed out a wound on his foot that the patient was not aware existed. He does have type 2 diabetes with significant neuropathy. His diabetes is suboptimally controlled with his most recent A1c being 8.5. He also has a history of coronary artery disease status post three- vessel CABG. he was initially seen by orthopedics, but they referred him to [facility]. He has undergone at least 7 operations/debridements and several  applications of skin substitute under the care of podiatry. He has been in a wound VAC for much of this time. His most recent procedure was July 28, 2021. A portion of the talus was biopsied and was found to be consistent with osteomyelitis. Culture also returned positive for corynebacterium. He was seen on August 16, 2021 by infectious disease. A PICC line has been placed and he will be receiving a 6-week course of IV daptomycin and cefepime. In October 2022, he underwent lower extremity vascular studies. Results are copied here: Right: Resting right ankle-brachial index is within normal range. No evidence of significant right lower extremity arterial disease. The right toe-brachial index is abnormal. Left: Resting left ankle-brachial index indicates mild left lower extremity arterial disease. The left toe-brachial index is abnormal. He has not been seen by vascular surgery despite these findings. He presented to clinic today in a cam boot and is using a knee scooter to offload. Wound VAC was in place. Once this was removed, a large ulcer was identified on the left midfoot/ankle. Bone is frankly exposed. There is no malodorous or purulent drainage. There is some granulation tissue over the central portion of the exposed bone. There is a tunnel that extends posteriorly for roughly 10 cm. It has been discussed with him by multiple providers that he is at very high risk of losing his lower leg because of this wound. He is extremely eager to avoid this outcome and is here today to review his options as well as receive ongoing wound care. 09/03/2021: Here for reevaluation of his wound. There does not appear to have been any substantial improvement overall since our last visit. He has been in a wound VAC with white foam overlying the exposed bone. We are working on getting him approved for hyperbaric oxygen therapy. 09/10/2021: We are in the process of getting him cleared  to begin hyperbaric oxygen  therapy. He still needs to obtain a chest x-ray. Although the wound GREY, RAKESTRAW A ([id number]) [id number] Page 2 of 9 measurements are roughly the same, I think the overall appearance of the wound is better. The exposed bone has a bit more granulation tissue covering it. He has not received a vascular surgery appointment to reevaluate his flow to the wound. 09/17/2021: He has been approved for hyperbaric oxygen therapy and completed his chest x-ray, which I reviewed and it appears normal. The tunnels at the 12 and 10:00 positions are smaller. There is more granulation tissue covering the exposed bone and the undermining has decreased. He still has not received a vascular surgery appointment. 09/24/2021: He initiated hyperbaric oxygen therapy this week and is tolerating it well. He has an appointment with vascular surgery coming up on May 16. The granulation tissue is covering more of the exposed bone and both tunnels are a bit smaller. 10/01/2021: He continues to tolerate hyperbaric oxygen therapy. He saw infectious disease and they are planning to pull his PICC line. He has been initiated on oral antibiotics (doxycycline and Augmentin). The wound looks about the same but the tunnels are a little bit smaller. The skin seems to be contracting somewhat around the exposed bone. 10/08/2021: The wound is still about the same size, but the tunnels continue to come in and the skin is contracting around the exposed bone. He continues to have some accumulation of necrotic material in the inferoposterior aspect of the wound as well as accumulation at the 12:00 tunnel area. 10/15/2021: The wound is smaller today. The tunnels continue to come in. There is less necrotic tissue present. He does have some periwound maceration. 10/22/2021: The wound is about the same size. There is a little bit less undermining at the distal portion. The exposed bone is dark and I am not sure if this  is staining from silver nitrate or his VAC sponge or if it represents necrosis. The tunnels are shallower but he does have some serous drainage coming from the 10:00 tunnel. He continues to tolerate hyperbaric oxygen therapy well. 10/29/2021: The undermining continues to improve. The tunnels are about the same. He has good granulation tissue overlying the majority of the exposed bone. It does appear that perhaps the tubing from his wound VAC has been eroding the skin at the 12 clock position. He continues to accumulate senescent epithelium around the borders of the wound. 11/05/2021: The undermining is almost completely resolved. The tunnels have contracted fairly significantly. No significant slough or debris accumulation. There is still senescent epithelium accumulation around the borders of the wound. He has been tolerating hyperbaric oxygen therapy well. 11/12/2021: Despite the measurements of the wound being about the same, the wound has changed in its shape and overall, I think it is improved. The undermining has resolved and the tunnels continue to shorten. There is good granulation tissue encroaching over the small area of bone that has remained exposed at the 12 o'clock position. Minimal slough accumulation. He continues to tolerate hyperbaric oxygen therapy well. 11/19/2021: I took a PCR culture last week. There was overgrowth of yeast. He is already taking suppressive doxycycline and Augmentin. I added fluconazole to his regimen. The wound is smaller again today. The tunnels continue to shorten. He continues to do well with hyperbaric oxygen therapy. 11/26/2021: For some reason, his foot has become macerated. The wound is narrower but about the same dimensions in its longitudinal aspect. The tunnels continue to shorten.  He has some slough buildup on the wound as well as some heaped up senescent epithelium around the perimeter. 12/03/2021: No further maceration of his foot has occurred. The wound  has contracted quite significantly from last week. The tunnel at 10:00 is closed. The tunnel at 12:00 is down to just a couple of millimeters. No other undermining is present. There is soft tissue coverage of the previously exposed bone. There is just a bit of slough and biofilm on the wound surface. 12/10/2021: The wound is looking good. It turns out the tunnel at 12:00 is only exposed when the patient dorsiflexes his foot. It is about 2 cm in depth when he does this; when his foot is in plantarflexion, the tunnel is closed. The bone that was visible at the 12:00 tunnel is completely covered with granulation tissue, but there does feel like some exposed bone deeper into the tunnel area. There is senescent skin heaped up around the periphery. Minimal slough on the wound surface. 12/16/2021: The wound dimensions are roughly the same. The surface has nice granulation tissue. The exposed bone at the 12:00 tunnel continues to be covered with more soft tissue. 7/14; patient's wound measures smaller today. Using the wound VAC with underlying collagen. He is also being treated with HBO for underlying osteomyelitis. He tells me he is on doxycycline and ampicillin follows with infectious disease next week 12/31/2021: The wound continues to contract. Unfortunately, the area where the track pad and tubing have been rubbing continues to look like it is applying friction. He says that the home health nurses that have been applying the [facility] have been putting gauze underneath the tubing, but nonetheless there is ongoing tissue breakdown at this site. Light slough accumulation on the wound surface. The tunnel continues to contract. He is tolerating HBO without difficulty. 01/07/2022: Bridging the wound VAC away from the ankle has resulted in significant improvement in the tissue at the apex of the wound. The tunnel is still present and is not all that much shorter, but the overall wound surface is very robust and healthy  looking. Minimal slough accumulation. No concern for acute infection. 01/21/2022: The wound continues to contract and has a robust granulation tissue surface. The tunnel has come in considerably and is down to about 1.4 cm. There is still bone exposed within the tunnel but the rest of it is well covered. There is some senescent epithelium at the wound margins and a little bit of slough on the surface. 01/28/2022: No significant change in the wound this week, but there has not been any reaccumulation of senescent epithelium or slough. The tunnel is perhaps a millimeter less in depth. He has been approved for Apligraf and we will apply this today. 02/04/2022: The wound has contracted somewhat and the tunnel has filled in completely. The wound surface is clean. He is here for Apligraf #2. 02/11/2022: The wound has contracted further and is now nearly flush with the surrounding skin surface. Light layer of slough. Apligraf #3 plan for today. 02/18/2022: There is a band of epithelium trying to cut across the superior portion of the wound. There is robust granulation tissue with just a light layer of slough and biofilm on the surface. There was a little bit of greenish drainage in the wound VAC but none appreciated on the site itself. He is here for Apligraf #4. 03/04/2022: The wound has contracted considerably. There is good granulation tissue on the surface. Minimal biofilm. He is here for Apligraf #5. 03/18/2022: The  wound has epithelialized to the point that it has been divided into 2 areas. Both areas are smaller in total than at his previous visit. There is good granulation tissue present with just a little bit of periwound eschar and surface slough. 10/13; left medial foot. The patient has completed treatment with hyperbaric oxygen for underlying osteomyelitis he has had a nice response in the wound. Noted today that he still had some greenish drainage. Previously he has been treated with topical gentamicin  but apparently that was stopped 2 weeks ago. Fortunately the wound is measuring smaller 10/20; wound looks better no hypergranulation minimal drainage. Granulation tissue looks healthy using gentamicin Hydrofera Blue under compression 04/08/2022: The wound continues to contract tremendously. The response to his treatment for hypertrophic granulation tissue has been quite remarkable. There are just a couple of open areas remaining. 04/18/2022: His wound is healed. HUSSIEN, GREENBLATT A ([id number]) [id number] Page 3 of 9 Electronic Signature(s) Signed: 04/18/2022 11:39:32 AM By: [name] MD FACS Entered By: [name] on 04/18/2022 11:39:32 -------------------------------------------------------------------------------- Physical Exam Details Patient Name: Date of Service: NA [name] 04/18/2022 10:45 A M Medical Record Number: [id number] Patient Account Number: [id number] Date of Birth/Sex: Treating RN: 1956-01-19 (66 y.o. M) Primary Care Provider: [name] Other Clinician: Referring Provider: Treating Provider/Extender: [name]: 33 Constitutional Hypertensive, asymptomatic. . . . No acute distress.Marland Kitchen Respiratory Normal work of breathing on room air.. Notes 04/18/2022: His wound is healed. Electronic Signature(s) Signed: 04/18/2022 11:42:31 AM By: [name] MD FACS Entered By: [name] on 04/18/2022 11:42:31 -------------------------------------------------------------------------------- Physician Orders Details Patient Name: Date of Service: NA [name] 04/18/2022 10:45 A M Medical Record Number: [id number] Patient Account Number: [id number] Date of Birth/Sex: Treating RN: [birth year] (66 y.o. 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 Edema Control - Lymphedema / SCD / Other Elevate legs to the level of the heart or above for 30 minutes daily and/or when sitting, a frequency of: Avoid standing for long periods of time. Additional Orders / Instructions Other: - stay off of it for the next few weeks SAYED, APOSTOL A ([id number]) [id number] Page 4 of 9 go see a podiatrist to see about a custom insert Electronic Signature(s) Signed: 04/18/2022 11:42:42 AM By: [name] MD FACS Entered By: [name] on 04/18/2022 11:42:42 -------------------------------------------------------------------------------- Problem List Details Patient Name: Date of Service: NA [name] 04/18/2022 10:45 A M Medical Record Number: [id number] Patient Account Number: [id number] Date of Birth/Sex: Treating RN: 12/02/1955 (66 y.o. 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 09/21/2021 No Yes N17.001 Other chronic osteomyelitis, left ankle and foot 08/25/2021 No Yes E11.610 Type 2 diabetes mellitus with diabetic neuropathic arthropathy 08/25/2021 No Yes E11.621 Type 2 diabetes mellitus with foot ulcer 08/25/2021 No Yes  I25.10 Atherosclerotic heart disease of native  coronary artery without angina pectoris 08/25/2021 No Yes Inactive Problems Resolved Problems Electronic Signature(s) Signed: 04/18/2022 11:38:58 AM By: [name] MD FACS Entered By: [name] on 04/18/2022 11:38:58 -------------------------------------------------------------------------------- Progress Note Details Patient Name: Date of Service: NA [name] 04/18/2022 10:45 A M Medical Record Number: [id number] Patient Account Number: [id number] Date of Birth/Sex: Treating RN: 1955/08/18 (66 y.o. M) Primary Care Provider: [name] Other Clinician: Referring Provider: Treating Provider/Extender: [name] in Treatment: [address] Adarryl A ([id number]) [id number] Page 5 of 9 Subjective Chief Complaint Information obtained from Patient Patients presents for treatment of an open diabetic ulcer with exposed bone and osteomyelitis History of Present Illness (HPI) ADMISSION 08/25/2021 This is a 66 year old man who initially presented to his primary care provider in September 2022 with pain in his left foot. He was sent for an x-ray and while the x-ray was being performed, the tech pointed out a wound on his foot that the patient was not aware existed. He does have type 2 diabetes with significant neuropathy. His diabetes is suboptimally controlled with his most recent A1c being 8.5. He also has a history of coronary artery disease status post three- vessel CABG. he was initially seen by orthopedics, but they referred him to [facility]. He has undergone at least 7 operations/debridements and several applications of skin substitute under the care of podiatry. He has been in a wound VAC for much of this time. His most recent procedure was July 28, 2021. A portion of the talus was biopsied and was found to be consistent with osteomyelitis. Culture also returned positive for corynebacterium. He was  seen on August 16, 2021 by infectious disease. A PICC line has been placed and he will be receiving a 6-week course of IV daptomycin and cefepime. In October 2022, he underwent lower extremity vascular studies. Results are copied here: Right: Resting right ankle-brachial index is within normal range. No evidence of significant right lower extremity arterial disease. The right toe-brachial index is abnormal. Left: Resting left ankle-brachial index indicates mild left lower extremity arterial disease. The left toe-brachial index is abnormal. He has not been seen by vascular surgery despite these findings. He presented to clinic today in a cam boot and is using a knee scooter to offload. Wound VAC was in place. Once this was removed, a large ulcer was identified on the left midfoot/ankle. Bone is frankly exposed. There is no malodorous or purulent drainage. There is some granulation tissue over the central portion of the exposed bone. There is a tunnel that extends posteriorly for roughly 10 cm. It has been discussed with him by multiple providers that he is at very high risk of losing his lower leg because of this wound. He is extremely eager to avoid this outcome and is here today to review his options as well as receive ongoing wound care. 09/03/2021: Here for reevaluation of his wound. There does not appear to have been any substantial improvement overall since our last visit. He has been in a wound VAC with white foam overlying the exposed bone. We are working on getting him approved for hyperbaric oxygen therapy. 09/10/2021: We are in the process of getting him cleared to begin hyperbaric oxygen therapy. He still needs to obtain a chest x-ray. Although the wound measurements are roughly the same, I think the overall appearance of the wound is better. The exposed bone has a bit more granulation  tissue covering it. He has not received a vascular surgery appointment to reevaluate his flow to the  wound. 09/17/2021: He has been approved for hyperbaric oxygen therapy and completed his chest x-ray, which I reviewed and it appears normal. The tunnels at the 12 and 10:00 positions are smaller. There is more granulation tissue covering the exposed bone and the undermining has decreased. He still has not received a vascular surgery appointment. 09/24/2021: He initiated hyperbaric oxygen therapy this week and is tolerating it well. He has an appointment with vascular surgery coming up on May 16. The granulation tissue is covering more of the exposed bone and both tunnels are a bit smaller. 10/01/2021: He continues to tolerate hyperbaric oxygen therapy. He saw infectious disease and they are planning to pull his PICC line. He has been initiated on oral antibiotics (doxycycline and Augmentin). The wound looks about the same but the tunnels are a little bit smaller. The skin seems to be contracting somewhat around the exposed bone. 10/08/2021: The wound is still about the same size, but the tunnels continue to come in and the skin is contracting around the exposed bone. He continues to have some accumulation of necrotic material in the inferoposterior aspect of the wound as well as accumulation at the 12:00 tunnel area. 10/15/2021: The wound is smaller today. The tunnels continue to come in. There is less necrotic tissue present. He does have some periwound maceration. 10/22/2021: The wound is about the same size. There is a little bit less undermining at the distal portion. The exposed bone is dark and I am not sure if this is staining from silver nitrate or his VAC sponge or if it represents necrosis. The tunnels are shallower but he does have some serous drainage coming from the 10:00 tunnel. He continues to tolerate hyperbaric oxygen therapy well. 10/29/2021: The undermining continues to improve. The tunnels are about the same. He has good granulation tissue overlying the majority of the exposed bone. It  does appear that perhaps the tubing from his wound VAC has been eroding the skin at the 12 clock position. He continues to accumulate senescent epithelium around the borders of the wound. 11/05/2021: The undermining is almost completely resolved. The tunnels have contracted fairly significantly. No significant slough or debris accumulation. There is still senescent epithelium accumulation around the borders of the wound. He has been tolerating hyperbaric oxygen therapy well. 11/12/2021: Despite the measurements of the wound being about the same, the wound has changed in its shape and overall, I think it is improved. The undermining has resolved and the tunnels continue to shorten. There is good granulation tissue encroaching over the small area of bone that has remained exposed at the 12 o'clock position. Minimal slough accumulation. He continues to tolerate hyperbaric oxygen therapy well. 11/19/2021: I took a PCR culture last week. There was overgrowth of yeast. He is already taking suppressive doxycycline and Augmentin. I added fluconazole to his regimen. The wound is smaller again today. The tunnels continue to shorten. He continues to do well with hyperbaric oxygen therapy. 11/26/2021: For some reason, his foot has become macerated. The wound is narrower but about the same dimensions in its longitudinal aspect. The tunnels continue to shorten. He has some slough buildup on the wound as well as some heaped up senescent epithelium around the perimeter. 12/03/2021: No further maceration of his foot has occurred. The wound has contracted quite significantly from last week. The tunnel at 10:00 is closed. The tunnel at 12:00 is  down to just a couple of millimeters. No other undermining is present. There is soft tissue coverage of the previously exposed bone. There is just a bit of slough and biofilm on the wound surface. 12/10/2021: The wound is looking good. It turns out the tunnel at 12:00 is only exposed when  the patient dorsiflexes his foot. It is about 2 cm in depth when he does this; when his foot is in plantarflexion, the tunnel is closed. The bone that was visible at the 12:00 tunnel is completely covered with granulation tissue, but there does feel like some exposed bone deeper into the tunnel area. There is senescent skin heaped up around the periphery. Minimal slough on the wound surface. 12/16/2021: The wound dimensions are roughly the same. The surface has nice granulation tissue. The exposed bone at the 12:00 tunnel continues to be covered with more soft tissue. ROCKO, FESPERMAN A ([id number]) [id number] Page 6 of 9 7/14; patient's wound measures smaller today. Using the wound VAC with underlying collagen. He is also being treated with HBO for underlying osteomyelitis. He tells me he is on doxycycline and ampicillin follows with infectious disease next week 12/31/2021: The wound continues to contract. Unfortunately, the area where the track pad and tubing have been rubbing continues to look like it is applying friction. He says that the home health nurses that have been applying the [facility] have been putting gauze underneath the tubing, but nonetheless there is ongoing tissue breakdown at this site. Light slough accumulation on the wound surface. The tunnel continues to contract. He is tolerating HBO without difficulty. 01/07/2022: Bridging the wound VAC away from the ankle has resulted in significant improvement in the tissue at the apex of the wound. The tunnel is still present and is not all that much shorter, but the overall wound surface is very robust and healthy looking. Minimal slough accumulation. No concern for acute infection. 01/21/2022: The wound continues to contract and has a robust granulation tissue surface. The tunnel has come in considerably and is down to about 1.4 cm. There is still bone exposed within the tunnel but the rest of it is well covered. There is  some senescent epithelium at the wound margins and a little bit of slough on the surface. 01/28/2022: No significant change in the wound this week, but there has not been any reaccumulation of senescent epithelium or slough. The tunnel is perhaps a millimeter less in depth. He has been approved for Apligraf and we will apply this today. 02/04/2022: The wound has contracted somewhat and the tunnel has filled in completely. The wound surface is clean. He is here for Apligraf #2. 02/11/2022: The wound has contracted further and is now nearly flush with the surrounding skin surface. Light layer of slough. Apligraf #3 plan for today. 02/18/2022: There is a band of epithelium trying to cut across the superior portion of the wound. There is robust granulation tissue with just a light layer of slough and biofilm on the surface. There was a little bit of greenish drainage in the wound VAC but none appreciated on the site itself. He is here for Apligraf #4. 03/04/2022: The wound has contracted considerably. There is good granulation tissue on the surface. Minimal biofilm. He is here for Apligraf #5. 03/18/2022: The wound has epithelialized to the point that it has been divided into 2 areas. Both areas are smaller in total than at his previous visit. There is good granulation tissue present with just a little bit of periwound eschar  and surface slough. 10/13; left medial foot. The patient has completed treatment with hyperbaric oxygen for underlying osteomyelitis he has had a nice response in the wound. Noted today that he still had some greenish drainage. Previously he has been treated with topical gentamicin but apparently that was stopped 2 weeks ago. Fortunately the wound is measuring smaller 10/20; wound looks better no hypergranulation minimal drainage. Granulation tissue looks healthy using gentamicin Hydrofera Blue under compression 04/08/2022: The wound continues to contract tremendously. The response to his  treatment for hypertrophic granulation tissue has been quite remarkable. There are just a couple of open areas remaining. 04/18/2022: His wound is healed. Patient History Information obtained from Patient. Family History Cancer - Father, Diabetes - Father,Mother,Paternal Grandparents, Heart Disease - Father, Hypertension - Father, No family history of Hereditary Spherocytosis, Kidney Disease, Lung Disease, Seizures, Stroke, Thyroid Problems, Tuberculosis. Social History Never smoker, Marital Status - Married, Alcohol Use - Rarely, Drug Use - No History, Caffeine Use - Daily. Medical History Eyes Patient has history of Cataracts - Removed 2008 Cardiovascular Patient has history of Coronary Artery Disease, Hypertension, Myocardial Infarction, Peripheral Arterial Disease Endocrine Patient has history of Type II Diabetes Musculoskeletal Patient has history of Osteomyelitis Neurologic Patient has history of Neuropathy Medical A Surgical History Notes nd Cardiovascular Hypercholesterolemia Abnormal EKG CABG X3 2019 Gastrointestinal GERD Musculoskeletal Diabetic foot ulcer Objective Constitutional Hypertensive, asymptomatic. No acute distress.. Vitals Time Taken: 10:43 AM, Height: 74 in, Weight: 186 lbs, BMI: 23.9, Temperature: 97.5 F, Pulse: 69 bpm, Respiratory Rate: 20 breaths/min, Blood Pressure: 152/86 mmHg. [name], Ubaldo A ([id number]) [id number] Page 7 of 9 Respiratory Normal work of breathing on room air.. General Notes: 04/18/2022: His wound is healed. Integumentary (Hair, Skin) Wound #1 status is Open. Original cause of wound was Gradually Appeared. The date acquired was: 02/22/2021. The wound has been in treatment 33 weeks. The wound is located on the Left,Medial Foot. The wound measures 0cm length x 0cm width x 0cm depth; 0cm^2 area and 0cm^3 volume. There is no tunneling or undermining noted. There is a none present amount of drainage noted.  The wound margin is distinct with the outline attached to the wound base. There is no granulation within the wound bed. There is no necrotic tissue within the wound bed. The periwound skin appearance had no abnormalities noted for texture. The periwound skin appearance had no abnormalities noted for color. The periwound skin appearance exhibited: Dry/Scaly. The periwound skin appearance did not exhibit: Maceration. Periwound temperature was noted as No Abnormality. 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 Edema Control - Lymphedema / SCD / Other: Elevate legs to the level of the heart or above for 30 minutes daily and/or when sitting, a frequency of: Avoid standing for long periods of time. Additional Orders / Instructions: Other: - stay off of it for the next few weeks go see a podiatrist to see about a custom insert 04/18/2022: His wound is healed. I encouraged him to continue to pad and protect the area for another week or so and to continue to use his knee scooter until he is able to be seen by podiatry; I think he will need a custom orthotic insert to protect his foot  and ankle. He will be discharged from the wound care center. Follow-up as needed. Electronic Signature(s) Signed: 04/18/2022 11:43:34 AM By: [name] MD FACS Entered By: [name] on 04/18/2022 11:43:34 -------------------------------------------------------------------------------- HxROS Details Patient Name: Date of Service: NA [name], [name] 04/18/2022 10:45 A M Medical Record Number: [id number] Patient Account Number:  [id number] Date of Birth/Sex: Treating RN: Apr 30, 1956 (66 y.o. M) Primary Care Provider: [name] Other Clinician: Referring Provider: Treating Provider/Extender: [name]: 33 Information Obtained From Patient Eyes Medical History: Positive for: Cataracts - Removed 2008 Cardiovascular MARSHAUN, LORTIE A ([id number]) [id number] Page 8 of 9 Medical History: Positive for: Coronary Artery Disease; Hypertension; Myocardial Infarction; Peripheral Arterial Disease Past Medical History Notes: Hypercholesterolemia Abnormal EKG CABG X3 2019 Gastrointestinal Medical History: Past Medical History Notes: GERD Endocrine Medical History: Positive for: Type II Diabetes Time with diabetes: 24 years Treated with: Insulin, Oral agents Blood sugar tested every day: Yes Tested : Musculoskeletal Medical History: Positive for: Osteomyelitis Past Medical History Notes: Diabetic foot ulcer Neurologic Medical History: Positive for: Neuropathy HBO Extended History Items Eyes: Cataracts Immunizations Pneumococcal Vaccine: Received Pneumococcal Vaccination: Yes Received Pneumococcal Vaccination On or After 60th Birthday: Yes Implantable Devices Yes Family and Social History Cancer: Yes - Father; Diabetes: Yes - Father,Mother,Paternal Grandparents; Heart Disease: Yes - Father; Hereditary Spherocytosis: No; Hypertension: Yes - Father; Kidney Disease: No; Lung Disease: No; Seizures: No; Stroke: No; Thyroid Problems: No; Tuberculosis: No; Never smoker; Marital Status - Married; Alcohol Use: Rarely; Drug Use: No History; Caffeine Use: Daily; Financial Concerns: No; Food, Clothing or Shelter Needs: No; Support System Lacking: No; Transportation Concerns: No Electronic Signature(s) Signed: 04/18/2022 12:07:14 PM By: [name] MD FACS Entered By: [name] on 04/18/2022  11:39:38 -------------------------------------------------------------------------------- SuperBill Details Patient Name: Date of Service: NA [name] 04/18/2022 Medical Record Number: [id number] Patient Account Number: [id number] Date of Birth/Sex: Treating RN: Dec 28, 1955 (66 y.o. M) Primary Care Provider: [name] Other Clinician: Referring Provider: Treating Provider/Extender: [name]: [address] 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 M86.672 Other chronic osteomyelitis, left ankle and foot E11.610 Type 2 diabetes mellitus with diabetic neuropathic arthropathy E11.621 Type 2 diabetes mellitus with foot ulcer I25.10 Atherosclerotic heart disease of native coronary artery without angina pectoris Physician Procedures : CPT4 Code Description Modifier [id number] - WC PHYS LEVEL 3 - EST PT ICD-10 Diagnosis Description L97.324 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

## 2022-04-20 DIAGNOSIS — M14672 Charcot's joint, left ankle and foot: Secondary | ICD-10-CM | POA: Diagnosis not present

## 2022-04-20 DIAGNOSIS — L97329 Non-pressure chronic ulcer of left ankle with unspecified severity: Secondary | ICD-10-CM | POA: Diagnosis not present

## 2022-04-20 DIAGNOSIS — E11622 Type 2 diabetes mellitus with other skin ulcer: Secondary | ICD-10-CM | POA: Diagnosis not present

## 2022-04-21 DIAGNOSIS — E11621 Type 2 diabetes mellitus with foot ulcer: Secondary | ICD-10-CM | POA: Diagnosis not present

## 2022-06-25 ENCOUNTER — Other Ambulatory Visit (INDEPENDENT_AMBULATORY_CARE_PROVIDER_SITE_OTHER): Payer: BC Managed Care – PPO | Admitting: Podiatry

## 2022-06-25 DIAGNOSIS — I739 Peripheral vascular disease, unspecified: Secondary | ICD-10-CM

## 2022-06-25 DIAGNOSIS — E1142 Type 2 diabetes mellitus with diabetic polyneuropathy: Secondary | ICD-10-CM

## 2022-06-25 DIAGNOSIS — L97422 Non-pressure chronic ulcer of left heel and midfoot with fat layer exposed: Secondary | ICD-10-CM

## 2022-06-25 DIAGNOSIS — M14672 Charcot's joint, left ankle and foot: Secondary | ICD-10-CM

## 2022-06-25 NOTE — Progress Notes (Signed)
Pt called on call line to state new wound has developed at area of old wound on left foot present for 1-2 weeks. Does not sound emergent.     Pt asking for advice and instructions on what to do. He is currently taking abx long term and recommend continue taking these. Pt wife requested that pictures be sent to me for review. I looked at wound picture sent to me via text. Wound at the medial aspect left foot near talar neck area, mild erythema surrounding, healthy pink granular tissue in wound bed. No obvious signs of infection.  I recommended betadine dressings daily until he can be evaluted. Recommend they call next week for apt. Continue to monitor for worsening redness swelling or drainage or fever and proceed to ED if these occur. Pt and his wife stated understanding of instructions and to call for apt.          [name], [place] / [facility]                   06/25/2022

## 2022-07-04 ENCOUNTER — Encounter (HOSPITAL_BASED_OUTPATIENT_CLINIC_OR_DEPARTMENT_OTHER): Payer: BC Managed Care – PPO | Attending: General Surgery | Admitting: General Surgery

## 2022-07-04 DIAGNOSIS — E11621 Type 2 diabetes mellitus with foot ulcer: Secondary | ICD-10-CM | POA: Insufficient documentation

## 2022-07-04 DIAGNOSIS — L97322 Non-pressure chronic ulcer of left ankle with fat layer exposed: Secondary | ICD-10-CM | POA: Diagnosis not present

## 2022-07-04 DIAGNOSIS — Z951 Presence of aortocoronary bypass graft: Secondary | ICD-10-CM | POA: Insufficient documentation

## 2022-07-04 DIAGNOSIS — E1140 Type 2 diabetes mellitus with diabetic neuropathy, unspecified: Secondary | ICD-10-CM | POA: Insufficient documentation

## 2022-07-04 DIAGNOSIS — I2510 Atherosclerotic heart disease of native coronary artery without angina pectoris: Secondary | ICD-10-CM | POA: Insufficient documentation

## 2022-07-04 DIAGNOSIS — E1151 Type 2 diabetes mellitus with diabetic peripheral angiopathy without gangrene: Secondary | ICD-10-CM | POA: Insufficient documentation

## 2022-07-04 DIAGNOSIS — Z8619 Personal history of other infectious and parasitic diseases: Secondary | ICD-10-CM | POA: Insufficient documentation

## 2022-07-04 DIAGNOSIS — Z8249 Family history of ischemic heart disease and other diseases of the circulatory system: Secondary | ICD-10-CM | POA: Diagnosis not present

## 2022-07-04 DIAGNOSIS — E11622 Type 2 diabetes mellitus with other skin ulcer: Secondary | ICD-10-CM | POA: Diagnosis not present

## 2022-07-04 NOTE — Progress Notes (Signed)
[name] ([id number]) [id number] Page 1 of 9 Visit Report for 07/04/2022 Allergy List Details Patient Name: Date of Service: [name] 07/04/2022 1:30 PM Medical Record Number: [id number] Patient Account Number: [id number] Date of Birth/Sex: Treating RN: [birth year] (67 y.o. [name] Primary Care Jennene Downie: [name] Other Clinician: Referring Kaitlyn Franko: Treating Javid Kemler/Extender: [name]: 0 Allergies Active Allergies codeine Reaction: N/V Allergy Notes Electronic Signature(s) Signed: 07/04/2022 4:52:32 PM By: Tommie Ard RN Entered By: Tommie Ard on 07/04/2022 13:39:22 -------------------------------------------------------------------------------- Arrival Information Details Patient Name: Date of Service: Colin Fanning [name]. 07/04/2022 1:30 PM Medical Record Number: [id number] Patient Account Number: [id number] Date of Birth/Sex: Treating RN: Feb 15, 1956 (67 y.o. [name] Primary Care Roselle Norton: [name] Other Clinician: Referring Gaile Allmon: Treating Carlo Guevarra/Extender: [name] in Treatment: 0 Visit Information Patient Arrived: Knee Scooter Arrival Time: 13:37 Accompanied By: self Transfer Assistance: None Patient Identification Verified: Yes Secondary Verification Process Completed: Yes Patient Requires Transmission-Based Precautions: No Patient Has Alerts: No History Since Last Visit Added or deleted any medications: No Any new allergies or adverse reactions: No Had [name] fall or experienced change in activities of daily living that may affect risk of falls: No Signs or symptoms of abuse/neglect since last visito No Hospitalized since last visit: No Implantable device outside of the clinic excluding cellular tissue based products placed in the center since last visit: No Has Dressing in Place as Prescribed: Yes Electronic Signature(s) Signed:  07/04/2022 4:52:32 PM By: Tommie Ard RN Entered By: Tommie Ard on 07/04/2022 13:38:31 Colin Beech [name] ([id number]) [id number] Page 2 of 9 -------------------------------------------------------------------------------- Clinic Level of Care Assessment Details Patient Name: Date of Service: Colin Fanning [name]. 07/04/2022 1:30 PM Medical Record Number: [id number] Patient Account Number: [id number] Date of Birth/Sex: Treating RN: 1955/07/12 (67 y.o. [name] Primary Care Kayelee Herbig: [name] Other Clinician: Referring Arieon Scalzo: Treating Rennae Ferraiolo/Extender: [name]: 0 Clinic Level of Care Assessment Items TOOL 1 Quantity Score X- 1 0 Use when EandM and Procedure is performed on INITIAL visit ASSESSMENTS - Nursing Assessment / Reassessment X- 1 20 General Physical Exam (combine w/ comprehensive assessment (listed just below) when performed on new pt. evals) X- 1 25 Comprehensive Assessment (HX, ROS, Risk Assessments, Wounds Hx, etc.) ASSESSMENTS - Wound and Skin Assessment / Reassessment []  - 0 Dermatologic / Skin Assessment (not related to wound area) ASSESSMENTS - Ostomy and/or Continence Assessment and Care []  - 0 Incontinence Assessment and Management []  - 0 Ostomy Care Assessment and Management (repouching, etc.) PROCESS - Coordination of Care X - Simple Patient / Family Education for ongoing care 1 15 []  - 0 Complex (extensive) Patient / Family Education for ongoing care X- 1 10 Staff obtains , Records, T Results / Process Orders est X- 1 10 Staff telephones HHA, Nursing Homes / Clarify orders / etc []  - 0 Routine Transfer to another Facility (non-emergent condition) []  - 0 Routine Hospital Admission (non-emergent condition) X- 1 15 New Admissions / / Ordering NPWT Apligraf, etc. , []  - 0 Emergency Hospital Admission (emergent condition) PROCESS - Special  Needs []  - 0 Pediatric / Minor Patient Management []  - 0 Isolation Patient Management []  - 0 Hearing / Language / Visual special needs []  - 0 Assessment of Community assistance (transportation, D/C planning, etc.) []  - 0 Additional assistance / Altered mentation []  - 0 Support Surface(s) Assessment (bed, cushion, seat,  etc.) INTERVENTIONS - Miscellaneous []  - 0 External ear exam []  - 0 Patient Transfer (multiple staff / Civil Service fast streamer / Similar devices) []  - 0 Simple Staple / Suture removal (25 or less) []  - 0 Complex Staple / Suture removal (26 or more) []  - 0 Hypo/Hyperglycemic Management (do not check if billed separately) []  - 0 Ankle / Brachial Index (ABI) - do not check if billed separately Has the patient been seen at the hospital within the last three years: Yes Total Score: 95 Level Of Care: New/Established - Level 3 Electronic Signature(s) Signed: 07/04/2022 4:52:32 PM By: Blanche East RN [name], Signed: 07/04/2022 4:52:32 PM By: Blanche East RN [name] [id number] Page 3 of 9 Entered By: Blanche East on 07/04/2022 15:34:22 -------------------------------------------------------------------------------- Encounter Discharge Information Details Patient Name: Date of Service: [name] 07/04/2022 1:30 PM Medical Record Number: [id number] Patient Account Number: [id number] Date of Birth/Sex: Treating RN: Sep 15, 1955 (67 y.o. [name] Primary Care Farley Crooker: [name] Other Clinician: Referring Savera Donson: Treating Haidynn Almendarez/Extender: [name] Weeks in Treatment: 0 Encounter Discharge Information Items Post Procedure Vitals Discharge Condition: Stable Temperature (F): 97.6 Ambulatory Status: Knee Scooter Pulse (bpm): 76 Discharge Destination: Home Respiratory Rate (breaths/min): 18 Transportation: Private Auto Blood Pressure (mmHg): 174/88 Accompanied By: self Schedule Follow-up  Appointment: Yes Clinical Summary of Care: Electronic Signature(s) Signed: 07/04/2022 4:52:32 PM By: Blanche East RN Entered By: Blanche East on 07/04/2022 14:27:17 -------------------------------------------------------------------------------- Lower Extremity Assessment Details Patient Name: Date of Service: Colin Loveless [name]. 07/04/2022 1:30 PM Medical Record Number: [id number] Patient Account Number: [id number] Date of Birth/Sex: Treating RN: 1956/02/04 (67 y.o. [name] Primary Care Gabriella Guile: [name] Other Clinician: Referring Curtez Brallier: Treating Joselynn Amoroso/Extender: [name] Weeks in Treatment: 0 Edema Assessment Assessed: [Left: No] [Right: No] [Left: Edema] [Right: :] Calf Left: Right: Point of Measurement: From Medial Instep 36 cm Ankle Left: Right: Point of Measurement: From Medial Instep 25 cm Vascular Assessment Pulses: Dorsalis Pedis Palpable: [Left:Yes] Electronic Signature(s) Signed: 07/04/2022 4:52:32 PM By: Blanche East RN Entered By: Blanche East on 07/04/2022 13:43:51 [name] ([id number]) [id number] Page 4 of 9 -------------------------------------------------------------------------------- Multi Wound Chart Details Patient Name: Date of Service: Colin Loveless [name]. 07/04/2022 1:30 PM Medical Record Number: [id number] Patient Account Number: [id number] Date of Birth/Sex: Treating RN: 02-04-1956 (67 y.o. M) Primary Care Aedin Jeansonne: [name] Other Clinician: Referring Undine Nealis: Treating Chailyn Racette/Extender: [name] Weeks in Treatment: 0 Vital Signs Height(in): 74 Capillary Blood Glucose(mg/dl): 85 Weight(lbs): 187 Pulse(bpm): 59 Body Mass Index(BMI): 24 Blood Pressure(mmHg): 147/88 Temperature(F): 97.6 Respiratory Rate(breaths/min): 18 [2:Photos:] [N/[name]:N/[name]] Left, Medial Ankle N/[name]/[name] Wound Location: Pressure Injury N/[name]/[name] Wounding Event: Diabetic Wound/Ulcer  of the Lower N/[name]/[name] Primary Etiology: Extremity Cataracts, Coronary Artery Disease, N/[name]/[name] Comorbid History: Hypertension, Myocardial Infarction, Peripheral Arterial Disease, Type II Diabetes, Osteomyelitis, Neuropathy 06/13/2022 N/[name]/[name] Date Acquired: 0 N/[name]/[name] Weeks of Treatment: Open N/[name]/[name] Wound Status: No N/[name]/[name] Wound Recurrence: 2x1.3x0.1 N/[name]/[name] Measurements L x W x D (cm) 2.042 N/[name]/[name] [name] (cm) : rea 0.204 N/[name]/[name] Volume (cm) : 10 Starting Position 1 (o'clock): 2 Ending Position 1 (o'clock): 0.3 Maximum Distance 1 (cm): Yes N/[name]/[name] Undermining: Grade 1 N/[name]/[name] Classification: Medium N/[name]/[name] Exudate [name] mount: Serous N/[name]/[name] Exudate Type: amber N/[name]/[name] Exudate Color: Large (67-100%) N/[name]/[name] Granulation [name] mount: Small (1-33%) N/[name]/[name] Necrotic [name] mount: Fat Layer (Subcutaneous Tissue): Yes N/[name]  N/[name] Exposed Structures: Fascia: No Tendon: No Muscle: No Joint: No Bone: No Debridement - Selective/Open Wound N/[name]/[name] Debridement: Pre-procedure Verification/Time Out 14:06 N/[name]/[name] Taken: Lidocaine 5% topical ointment N/[name]/[name] Pain Control: Slough N/[name]/[name] Tissue Debrided: Non-Viable Tissue N/[name]/[name] Level: 2.6 N/[name]/[name] Debridement [name] (sq cm): rea Curette N/[name]/[name] Instrument: Minimum N/[name]/[name] Bleeding: Pressure N/[name]/[name] Hemostasis [name] chieved: Procedure was tolerated well N/[name]/[name] Debridement Treatment Response: 2x1.3x0.1 N/[name]/[name] Post Debridement Measurements L x W x D (cm) 0.204 N/[name]/[name] Post Debridement Volume: (cm) Henson, Choice [name] ([id number]) [id number] Page 5 of 9 Dry/Scaly: Yes N/[name]/[name] Periwound Skin Moisture: Maceration: No No Abnormalities Noted N/[name]/[name] Periwound Skin Color: No Abnormality N/[name]/[name] Temperature: Debridement N/[name]/[name] Procedures Performed: Treatment Notes Wound #2 (Ankle) Wound Laterality: Left, Medial Cleanser Normal Saline Discharge Instruction: Cleanse the wound with Normal Saline prior to  applying [name] clean dressing using gauze sponges, not tissue or cotton balls. Peri-Wound Care Sween Lotion (Moisturizing lotion) Discharge Instruction: Apply moisturizing lotion as directed Topical Primary Dressing Sorbalgon AG Dressing 2x2 (in/in) Discharge Instruction: Apply to wound bed as instructed Secondary Dressing ABD Pad, 5x9 Discharge Instruction: Apply over primary dressing as directed. Secured With Conforming Stretch Gauze Bandage, Sterile 2x75 (in/in) Discharge Instruction: Secure with stretch gauze as directed. Transpore Surgical Tape, 2x10 (in/yd) Discharge Instruction: Secure dressing with tape as directed. Compression Wrap Kerlix Roll 4.5x3.1 (in/yd) Discharge Instruction: Apply Kerlix and Coban compression as directed. Coban Self-Adherent Wrap 4x5 (in/yd) Discharge Instruction: Apply over Kerlix as directed. Compression Stockings Add-Ons Electronic Signature(s) Signed: 07/04/2022 2:38:27 PM By: [name] MD FACS Entered By: [name] on 07/04/2022 14:38:27 -------------------------------------------------------------------------------- Multi-Disciplinary Care Plan Details Patient Name: Date of Service: Colin [name] [name]. 07/04/2022 1:30 PM Medical Record Number: [id number] Patient Account Number: [id number] Date of Birth/Sex: Treating RN: 01/26/1956 (67 y.o. [name] Primary Care Gladies Sofranko: [name] Other Clinician: Referring Gaylen Pereira: Treating Patriciaann Rabanal/Extender: [name] Weeks in Treatment: 0 Active Inactive Orientation to the Wound Care Program Nursing Diagnoses: Knowledge deficit related to the wound healing center program UMAIR, ROSILES [name] ([id number]) [id number] Page 6 of 9 Goals: Patient/caregiver will verbalize understanding of the [place] Program Date Initiated: 07/04/2022 Target Resolution Date: 07/04/2022 Goal Status: Active Interventions: Provide education on  orientation to the wound center Notes: Pressure Nursing Diagnoses: Knowledge deficit related to causes and risk factors for pressure ulcer development Knowledge deficit related to management of pressures ulcers Goals: Patient will remain free of pressure ulcers Date Initiated: 07/04/2022 Target Resolution Date: 08/11/2022 Goal Status: Active Interventions: Assess: immobility, friction, shearing, incontinence upon admission and as needed Assess potential for pressure ulcer upon admission and as needed Treatment Activities: Patient referred for pressure reduction/relief devices : 07/04/2022 Notes: Wound/Skin Impairment Nursing Diagnoses: Knowledge deficit related to smoking impact on wound healing Goals: Ulcer/skin breakdown will have [name] volume reduction of 30% by week 4 Date Initiated: 07/04/2022 Target Resolution Date: 08/11/2022 Goal Status: Active Interventions: Assess ulceration(s) every visit Provide education on ulcer and skin care Notes: Electronic Signature(s) Signed: 07/04/2022 4:52:32 PM By: Blanche East RN Entered By: Blanche East on 07/04/2022 14:15:15 -------------------------------------------------------------------------------- Pain Assessment Details Patient Name: Date of Service: Colin Loveless [name]. 07/04/2022 1:30 PM Medical Record Number: [id number] Patient Account Number: [id number] Date of Birth/Sex: Treating RN: [birth year] (67 y.o. [name] Primary Care Imre Vecchione: [name] Other Clinician: Referring Ziquan Fidel: Treating Cleburn Maiolo/Extender: [name] Weeks in Treatment: 0 Active Problems  Location of Pain Severity and Description of Pain Patient Has Paino No Site Locations Rate the pain. KIYAAN, HAQ [name] ([id number]) [id number] Page 7 of 9 Rate the pain. Current Pain Level: 0 Pain Management and Medication Current Pain Management: Electronic Signature(s) Signed: 07/04/2022 4:52:32 PM By: Tommie Ard  RN Entered By: Tommie Ard on 07/04/2022 13:50:17 -------------------------------------------------------------------------------- Patient/Caregiver Education Details Patient Name: Date of Service: [name] 1/22/2024andnbsp1:30 PM Medical Record Number: [id number] Patient Account Number: [id number] Date of Birth/Gender: Treating RN: 1956-02-22 (67 y.o. [name] Primary Care Physician: [name] Other Clinician: Referring Physician: Treating Physician/Extender: [name] in Treatment: 0 Education Assessment Education Provided To: Patient Education Topics Provided Wound/Skin Impairment: Methods: Explain/Verbal Responses: Reinforcements needed, State content correctly Electronic Signature(s) Signed: 07/04/2022 4:52:32 PM By: Tommie Ard RN Entered By: Tommie Ard on 07/04/2022 14:15:24 -------------------------------------------------------------------------------- Wound Assessment Details Patient Name: Date of Service: Colin Fanning [name]. 07/04/2022 1:30 PM Medical Record Number: [id number] Patient Account Number: [id number] Date of Birth/Sex: Treating RN: [birth year] (67 y.o. [name] Primary Care Zaidee Rion: [name] Other Clinician: Referring Lindamarie Maclachlan: Treating Caden Fatica/Extender: Garritt, Molyneux [name] ([id number]) [id number] Page 8 of 9 Weeks in Treatment: 0 Wound Status Wound Number: 2 Primary Diabetic Wound/Ulcer of the Lower Extremity Etiology: Wound Location: Left, Medial Ankle Wound Open Wounding Event: Pressure Injury Status: Date Acquired: 06/13/2022 Comorbid Cataracts, Coronary Artery Disease, Hypertension, Myocardial Weeks Of Treatment: 0 History: Infarction, Peripheral Arterial Disease, Type II Diabetes, Clustered Wound: No Osteomyelitis, Neuropathy Photos Wound Measurements Length: (cm) 2 Width: (cm) 1.3 Depth: (cm) 0.1 Area: (cm) 2.042 Volume: (cm)  0.204 % Reduction in Area: % Reduction in Volume: Tunneling: No Undermining: Yes Starting Position (o'clock): 10 Ending Position (o'clock): 2 Maximum Distance: (cm) 0.3 Wound Description Classification: Grade 1 Exudate Amount: Medium Exudate Type: Serous Exudate Color: amber Foul Odor After Cleansing: No Slough/Fibrino Yes Wound Bed Granulation Amount: Large (67-100%) Exposed Structure Necrotic Amount: Small (1-33%) Fascia Exposed: No Necrotic Quality: Adherent Slough Fat Layer (Subcutaneous Tissue) Exposed: Yes Tendon Exposed: No Muscle Exposed: No Joint Exposed: No Bone Exposed: No Periwound Skin Texture Texture Color No Abnormalities Noted: No No Abnormalities Noted: Yes Moisture Temperature / Pain No Abnormalities Noted: No Temperature: No Abnormality Dry / Scaly: Yes Maceration: No Treatment Notes Wound #2 (Ankle) Wound Laterality: Left, Medial Cleanser Normal Saline Discharge Instruction: Cleanse the wound with Normal Saline prior to applying [name] clean dressing using gauze sponges, not tissue or cotton balls. Peri-Wound Care Sween Lotion (Moisturizing lotion) Discharge Instruction: Apply moisturizing lotion as directed Topical Primary Dressing Sorbalgon AG Dressing 2x2 (in/in) SAW, MENDENHALL [name] ([id number]) [id number] Page 9 of 9 Discharge Instruction: Apply to wound bed as instructed Secondary Dressing ABD Pad, 5x9 Discharge Instruction: Apply over primary dressing as directed. Secured With Conforming Stretch Gauze Bandage, Sterile 2x75 (in/in) Discharge Instruction: Secure with stretch gauze as directed. Transpore Surgical Tape, 2x10 (in/yd) Discharge Instruction: Secure dressing with tape as directed. Compression Wrap Kerlix Roll 4.5x3.1 (in/yd) Discharge Instruction: Apply Kerlix and Coban compression as directed. Coban Self-Adherent Wrap 4x5 (in/yd) Discharge Instruction: Apply over Kerlix as directed. Compression  Stockings Add-Ons Electronic Signature(s) Signed: 07/04/2022 4:52:32 PM By: Tommie Ard RN Entered By: Tommie Ard on 07/04/2022 13:49:45 -------------------------------------------------------------------------------- Vitals Details Patient Name: Date of Service: Colin [name] [name]. 07/04/2022 1:30 PM Medical Record Number: [id number] Patient Account Number: [id number] Date of Birth/Sex: Treating RN: December 11, 1955 (67 y.o. [name] Primary Care Alanzo Lamb: Nedra Hai,  Marquette Saa Other Clinician: Referring Lenah Messenger: Treating Josian Lanese/Extender: [name] in Treatment: 0 Vital Signs Time Taken: 13:38 Temperature (F): 97.6 Height (in): 74 Pulse (bpm): 76 Source: Stated Respiratory Rate (breaths/min): 18 Weight (lbs): 187 Blood Pressure (mmHg): 147/88 Source: Stated Capillary Blood Glucose (mg/dl): 85 Body Mass Index (BMI): 24 Reference Range: 80 - 120 mg / dl Electronic Signature(s) Signed: 07/04/2022 4:52:32 PM By: Tommie Ard RN Entered By: Tommie Ard on 07/04/2022 13:39:17

## 2022-07-04 NOTE — Progress Notes (Signed)
[name], [name] ([id number]) [id number] Page 1 of 11 Visit Report for 07/04/2022 Chief Complaint Document Details Patient Name: Date of Service: [name]. 07/04/2022 1:30 PM Medical Record Number: [id number] Patient Account Number: [id number] Date of Birth/Sex: Treating RN: [birth year] (67 y.o. M) Primary Care Provider: Simone CuriaLee, Keung Other Clinician: Referring Provider: Treating Provider/Extender: [name], Luc Shammas Lee, Keung Weeks in Treatment: 0 Information Obtained from: Patient Chief Complaint Patients presents for treatment of an open diabetic ulcer with exposed bone and osteomyelitis 07/04/2022: Diabetic ankle ulcer Electronic Signature(s) Signed: 07/04/2022 2:46:40 PM By: Duanne Guessannon, Amarie Viles MD FACS Previous Signature: 07/04/2022 2:38:54 PM Version By: Duanne Guessannon, Nilan Iddings MD FACS Entered By: Duanne Guessannon, Krissy Orebaugh on 07/04/2022 14:46:40 -------------------------------------------------------------------------------- Debridement Details Patient Name: Date of Service: NA [name]. 07/04/2022 1:30 PM Medical Record Number: [id number] Patient Account Number: [id number] Date of Birth/Sex: Treating RN: [birth year] (67 y.o. [name]) Zochol, Jamie Primary Care Provider: Simone CuriaLee, Keung Other Clinician: Referring Provider: Treating Provider/Extender: [name], Simcha Farrington Lee, Keung Weeks in Treatment: 0 Debridement Performed for Assessment: Wound #2 Left,Medial Ankle Performed By: Physician Duanne Guessannon, Avram Danielson, MD Debridement Type: Debridement Severity of Tissue Pre Debridement: Fat layer exposed Level of Consciousness (Pre-procedure): Awake and Alert Pre-procedure Verification/Time Out Yes - 14:06 Taken: Start Time: 14:07 Pain Control: Lidocaine 5% topical ointment T Area Debrided (L x W): otal 2 (cm) x 1.3 (cm) = 2.6 (cm) Tissue and other material debrided: Non-Viable, Slough, Slough Level: Non-Viable Tissue Debridement Description: Selective/Open Wound Instrument:  Curette Bleeding: Minimum Hemostasis Achieved: Pressure Response to Treatment: Procedure was tolerated well Level of Consciousness (Post- Awake and Alert procedure): Post Debridement Measurements of Total Wound Length: (cm) 2 Width: (cm) 1.3 Depth: (cm) 0.1 Volume: (cm) 0.204 Character of Wound/Ulcer Post Debridement: Requires Further Debridement Severity of Tissue Post Debridement: Fat layer exposed [employer], [name] ([id number]) [id number] Page 2 of 11 Post Procedure Diagnosis Same as Pre-procedure Notes Scribed for Dr. Lady Garyannon by Tommie ArdJamie Zochol, RN Electronic Signature(s) Signed: 07/04/2022 4:52:32 PM By: Tommie ArdZochol, Jamie RN Signed: 07/04/2022 5:26:04 PM By: Duanne Guessannon, Dayvon Dax MD FACS Entered By: Tommie ArdZochol, Jamie on 07/04/2022 14:12:21 -------------------------------------------------------------------------------- HPI Details Patient Name: Date of Service: NA [name]. 07/04/2022 1:30 PM Medical Record Number: [id number] Patient Account Number: [id number] Date of Birth/Sex: Treating RN: [birth year] (67 y.o. M) Primary Care Provider: Simone CuriaLee, Keung Other Clinician: Referring Provider: Treating Provider/Extender: [name], Samar Dass Lee, Keung Weeks in Treatment: 0 History of Present Illness HPI Description: ADMISSION 08/25/2021 This is [name] 67 year old man who initially presented to his primary care provider in September 2022 with pain in his left foot. He was sent for an x-ray and while the x-ray was being performed, the tech pointed out [name] wound on his foot that the patient was not aware existed. He does have type 2 diabetes with significant neuropathy. His diabetes is suboptimally controlled with his most recent A1c being 8.5. He also has [name] history of coronary artery disease status post three- vessel CABG. he was initially seen by orthopedics, but they referred him to [facility]. He has undergone at least 7 operations/debridements and several  applications of skin substitute under the care of podiatry. He has been in [name] for much of this time. His most recent procedure was July 28, 2021. [name] was biopsied and was found to be consistent with osteomyelitis. Culture also returned positive for corynebacterium. He was seen on August 16, 2021 by infectious disease. [name] PICC line has been placed and  he will be receiving [name] 6-week course of IV daptomycin and cefepime. In October 2022, he underwent lower extremity vascular studies. Results are copied here: Right: Resting right ankle-brachial index is within normal range. No evidence of significant right lower extremity arterial disease. The right toe-brachial index is abnormal. Left: Resting left ankle-brachial index indicates mild left lower extremity arterial disease. The left toe-brachial index is abnormal. He has not been seen by vascular surgery despite these findings. He presented to clinic today in [name] and is using [name] to offload. Wound VAC was in place. Once this was removed, [name] large ulcer was identified on the left midfoot/ankle. Bone is frankly exposed. There is no malodorous or purulent drainage. There is some granulation tissue over the central portion of the exposed bone. There is [name] tunnel that extends posteriorly for roughly 10 cm. It has been discussed with him by multiple providers that he is at very high risk of losing his lower leg because of this wound. He is extremely eager to avoid this outcome and is here today to review his options as well as receive ongoing wound care. 09/03/2021: Here for reevaluation of his wound. There does not appear to have been any substantial improvement overall since our last visit. He has been in [name] with white foam overlying the exposed bone. We are working on getting him approved for hyperbaric oxygen therapy. 09/10/2021: We are in the process of getting him cleared to begin hyperbaric oxygen  therapy. He still needs to obtain [name] chest x-ray. Although the wound measurements are roughly the same, I think the overall appearance of the wound is better. The exposed bone has [name] bit more granulation tissue covering it. He has not received [name] vascular surgery appointment to reevaluate his flow to the wound. 09/17/2021: He has been approved for hyperbaric oxygen therapy and completed his chest x-ray, which I reviewed and it appears normal. The tunnels at the 12 and 10:00 positions are smaller. There is more granulation tissue covering the exposed bone and the undermining has decreased. He still has not received [name] vascular surgery appointment. 09/24/2021: He initiated hyperbaric oxygen therapy this week and is tolerating it well. He has an appointment with vascular surgery coming up on May 16. The granulation tissue is covering more of the exposed bone and both tunnels are [name] bit smaller. 10/01/2021: He continues to tolerate hyperbaric oxygen therapy. He saw infectious disease and they are planning to pull his PICC line. He has been initiated on oral antibiotics (doxycycline and Augmentin). The wound looks about the same but the tunnels are [name] little bit smaller. The skin seems to be contracting somewhat around the exposed bone. 10/08/2021: The wound is still about the same size, but the tunnels continue to come in and the skin is contracting around the exposed bone. He continues to have some accumulation of necrotic material in the inferoposterior aspect of the wound as well as accumulation at the 12:00 tunnel area. 10/15/2021: The wound is smaller today. The tunnels continue to come in. There is less necrotic tissue present. He does have some periwound maceration. 10/22/2021: The wound is about the same size. There is [name] little bit less undermining at the distal portion. The exposed bone is dark and I am not sure if this is staining from silver nitrate or his VAC sponge or if it represents necrosis. The tunnels  are shallower but he does have some serous [facility], Emerald [name] (  [id number]) [id number] Page 3 of 11 10:00 tunnel. He continues to tolerate hyperbaric oxygen therapy well. 10/29/2021: The undermining continues to improve. The tunnels are about the same. He has good granulation tissue overlying the majority of the exposed bone. It does appear that perhaps the tubing from his wound VAC has been eroding the skin at the 12 clock position. He continues to accumulate senescent epithelium around the borders of the wound. 11/05/2021: The undermining is almost completely resolved. The tunnels have contracted fairly significantly. No significant slough or debris accumulation. There is still senescent epithelium accumulation around the borders of the wound. He has been tolerating hyperbaric oxygen therapy well. 11/12/2021: Despite the measurements of the wound being about the same, the wound has changed in its shape and overall, I think it is improved. The undermining has resolved and the tunnels continue to shorten. There is good granulation tissue encroaching over the small area of bone that has remained exposed at the 12 o'clock position. Minimal slough accumulation. He continues to tolerate hyperbaric oxygen therapy well. 11/19/2021: I took [name] PCR culture last week. There was overgrowth of yeast. He is already taking suppressive doxycycline and Augmentin. I added fluconazole to his regimen. The wound is smaller again today. The tunnels continue to shorten. He continues to do well with hyperbaric oxygen therapy. 11/26/2021: For some reason, his foot has become macerated. The wound is narrower but about the same dimensions in its longitudinal aspect. The tunnels continue to shorten. He has some slough buildup on the wound as well as some heaped up senescent epithelium around the perimeter. 12/03/2021: No further maceration of his foot has occurred. The wound has  contracted quite significantly from last week. The tunnel at 10:00 is closed. The tunnel at 12:00 is down to just [name] couple of millimeters. No other undermining is present. There is soft tissue coverage of the previously exposed bone. There is just [name] bit of slough and biofilm on the wound surface. 12/10/2021: The wound is looking good. It turns out the tunnel at 12:00 is only exposed when the patient dorsiflexes his foot. It is about 2 cm in depth when he does this; when his foot is in plantarflexion, the tunnel is closed. The bone that was visible at the 12:00 tunnel is completely covered with granulation tissue, but there does feel like some exposed bone deeper into the tunnel area. There is senescent skin heaped up around the periphery. Minimal slough on the wound surface. 12/16/2021: The wound dimensions are roughly the same. The surface has nice granulation tissue. The exposed bone at the 12:00 tunnel continues to be covered with more soft tissue. 7/14; patient's wound measures smaller today. Using the wound VAC with underlying collagen. He is also being treated with HBO for underlying osteomyelitis. He tells me he is on doxycycline and ampicillin follows with infectious disease next week 12/31/2021: The wound continues to contract. Unfortunately, the area where the track pad and tubing have been rubbing continues to look like it is applying friction. He says that the [facility] health [facility] putting gauze underneath the tubing, but nonetheless there is ongoing tissue breakdown at this site. Light slough accumulation on the wound surface. The tunnel continues to contract. He is tolerating HBO without difficulty. 01/07/2022: Bridging the wound VAC away from the ankle has resulted in significant improvement in the tissue at the apex of the wound. The tunnel is still present and is not all that much shorter, but  the overall wound surface is very robust and healthy  looking. Minimal slough accumulation. No concern for acute infection. 01/21/2022: The wound continues to contract and has [name] robust granulation tissue surface. The tunnel has come in considerably and is down to about 1.4 cm. There is still bone exposed within the tunnel but the rest of it is well covered. There is some senescent epithelium at the wound margins and [name] little bit of slough on the surface. 01/28/2022: No significant change in the wound this week, but there has not been any reaccumulation of senescent epithelium or slough. The tunnel is perhaps [name] millimeter less in depth. He has been approved for Apligraf and we will apply this today. 02/04/2022: The wound has contracted somewhat and the tunnel has filled in completely. The wound surface is clean. He is here for Apligraf #2. 02/11/2022: The wound has contracted further and is now nearly flush with the surrounding skin surface. Light layer of slough. Apligraf #3 plan for today. 02/18/2022: There is [name] band of epithelium trying to cut across the superior portion of the wound. There is robust granulation tissue with just [name] light layer of slough and biofilm on the surface. There was [name] little bit of greenish drainage in the wound VAC but none appreciated on the site itself. He is here for Apligraf #4. 03/04/2022: The wound has contracted considerably. There is good granulation tissue on the surface. Minimal biofilm. He is here for Apligraf #5. 03/18/2022: The wound has epithelialized to the point that it has been divided into 2 areas. Both areas are smaller in total than at his previous visit. There is good granulation tissue present with just [name] little bit of periwound eschar and surface slough. 10/13; left medial foot. The patient has completed treatment with hyperbaric oxygen for underlying osteomyelitis he has had [name] nice response in the wound. Noted today that he still had some greenish drainage. Previously he has been treated with topical gentamicin  but apparently that was stopped 2 weeks ago. Fortunately the wound is measuring smaller 10/20; wound looks better no hypergranulation minimal drainage. Granulation tissue looks healthy using gentamicin Hydrofera Blue under compression 04/08/2022: The wound continues to contract tremendously. The response to his treatment for hypertrophic granulation tissue has been quite remarkable. There are just [name] couple of open areas remaining. 04/18/2022: His wound is healed. READMISSION 07/04/2022: He returns to clinic with [name] new ulcer adjacent to where his previous large wound had been. He says that he was wearing boots and using [name] small padded dressing to protect the freshly healed ankle wound. Apparently the dressing rolled up and rubbed on his skin, causing [name] new wound. He contacted our office last week concerned about the appearance. There is [name] small oval wound on his left medial ankle. It is fairly clean with some periwound dry skin and eschar. Electronic Signature(s) Signed: 07/04/2022 2:49:04 PM By: [name] MD FACS Entered By: [name] on 07/04/2022 14:49:04 Colin Beech [name] ([id number]) [id number] Page 4 of 11 -------------------------------------------------------------------------------- Physical Exam Details Patient Name: Date of Service: [name] 07/04/2022 1:30 PM Medical Record Number: [id number] Patient Account Number: [id number] Date of Birth/Sex: Treating RN: 18-Sep-1955 (67 y.o. M) Primary Care Provider: [name] Other Clinician: Referring Provider: Treating Provider/Extender: [name]: 0 Constitutional Slightly hypertensive. . . . No acute distress. Respiratory Normal work of breathing on room air. Notes 07/04/2022: There is [name] small oval wound on his left medial ankle. It  is fairly clean with some periwound dry skin and eschar. Electronic Signature(s) Signed: 07/04/2022 2:51:47 PM By:  [name] MD FACS Entered By: [name] on 07/04/2022 14:51:46 -------------------------------------------------------------------------------- Physician Orders Details Patient Name: Date of Service: NA [name] 07/04/2022 1:30 PM Medical Record Number: [id number] Patient Account Number: [id number] Date of Birth/Sex: Treating RN: July 08, 1955 (67 y.o. [name] Primary Care Provider: [name] Other Clinician: Referring Provider: Treating Provider/Extender: [name]: 0 Verbal / Phone Orders: No Diagnosis Coding ICD-10 Coding Code Description [phone] Non-pressure chronic ulcer of left ankle with fat layer exposed I73.9 Peripheral vascular disease, unspecified E11.622 Type 2 diabetes mellitus with other skin ulcer Follow-up Appointments ppointment in 1 week. - Dr. Lady Gary - room 2 - Return [name] Anesthetic Wound #2 Left,Medial Ankle (In clinic) Topical Lidocaine 5% applied to wound bed - prior to debridement Edema Control - Lymphedema / SCD / Other Elevate legs to the level of the heart or above for 30 minutes daily and/or when sitting for 3-4 times [name] day throughout the day. Avoid standing for long periods of time. Wound Treatment Wound #2 - Ankle Wound Laterality: Left, Medial Cleanser: Normal Saline 1 x Per Day/30 Days Discharge Instructions: Cleanse the wound with Normal Saline prior to applying [name] clean dressing using gauze sponges, not tissue or cotton balls. Peri-Wound Care: Sween Lotion (Moisturizing lotion) 1 x Per Day/30 Days Discharge Instructions: Apply moisturizing lotion as directed PETRA, SARGEANT [name] ([id number]) [id number] Page 5 of 11 Prim Dressing: Sorbalgon AG Dressing 2x2 (in/in) (DME) (Dispense As Written) 1 x Per Day/30 Days ary Discharge Instructions: Apply to wound bed as instructed Secondary Dressing: ABD Pad, 5x9 (DME) (Generic) 1 x Per Day/30 Days Discharge  Instructions: Apply over primary dressing as directed. Secured With: Insurance underwriter, Sterile 2x75 (in/in) (DME) (Generic) 1 x Per Day/30 Days Discharge Instructions: Secure with stretch gauze as directed. Secured With: Transpore Surgical Tape, 2x10 (in/yd) 1 x Per Day/30 Days Discharge Instructions: Secure dressing with tape as directed. Compression Wrap: Kerlix Roll 4.5x3.1 (in/yd) (DME) (Generic) 1 x Per Day/30 Days Discharge Instructions: Apply Kerlix and Coban compression as directed. Compression Wrap: Coban Self-Adherent Wrap 4x5 (in/yd) (DME) (Generic) 1 x Per Day/30 Days Discharge Instructions: Apply over Kerlix as directed. Electronic Signature(s) Signed: 07/04/2022 5:26:04 PM By: [name] MD FACS Entered By: [name] on 07/04/2022 14:52:00 -------------------------------------------------------------------------------- Problem List Details Patient Name: Date of Service: NA [name] 07/04/2022 1:30 PM Medical Record Number: [id number] Patient Account Number: [id number] Date of Birth/Sex: Treating RN: [birth year] (67 y.o. M) Primary Care Provider: [name] Other Clinician: Referring Provider: Treating Provider/Extender: [name]: 0 Active Problems ICD-10 Encounter Code Description Active Date MDM Diagnosis L97.322 Non-pressure chronic ulcer of left ankle with fat layer exposed 07/04/2022 No Yes I73.9 Peripheral vascular disease, unspecified 07/04/2022 No Yes E11.622 Type 2 diabetes mellitus with other skin ulcer 07/04/2022 No Yes Inactive Problems Resolved Problems Electronic Signature(s) Signed: 07/04/2022 2:38:19 PM By: [name] MD FACS Entered By: [name] on 07/04/2022 14:38:19 Colin Beech [name] ([id number]) [id number] Page 6 of 11 -------------------------------------------------------------------------------- Progress Note Details Patient Name: Date  of Service: [name] 07/04/2022 1:30 PM Medical Record Number: [id number] Patient Account Number: [id number] Date of Birth/Sex: Treating RN: Sep 20, 1955 (67 y.o. M) Primary Care Provider: [name] Other Clinician: Referring Provider: Treating Provider/Extender: [name]: 0 Subjective Chief Complaint Information  obtained from Patient Patients presents for treatment of an open diabetic ulcer with exposed bone and osteomyelitis 07/04/2022: Diabetic ankle ulcer History of Present Illness (HPI) ADMISSION 08/25/2021 This is [name] 67 year old man who initially presented to his primary care provider in September 2022 with pain in his left foot. He was sent for an x-ray and while the x-ray was being performed, the tech pointed out [name] wound on his foot that the patient was not aware existed. He does have type 2 diabetes with significant neuropathy. His diabetes is suboptimally controlled with his most recent A1c being 8.5. He also has [name] history of coronary artery disease status post three- vessel CABG. he was initially seen by orthopedics, but they referred him to [facility]. He has undergone at least 7 operations/debridements and several applications of skin substitute under the care of podiatry. He has been in [name] for much of this time. His most recent procedure was July 28, 2021. [name] was biopsied and was found to be consistent with osteomyelitis. Culture also returned positive for corynebacterium. He was seen on August 16, 2021 by infectious disease. [name] PICC line has been placed and he will be receiving [name] 6-week course of IV daptomycin and cefepime. In October 2022, he underwent lower extremity vascular studies. Results are copied here: Right: Resting right ankle-brachial index is within normal range. No evidence of significant right lower extremity arterial disease. The right toe-brachial index is  abnormal. Left: Resting left ankle-brachial index indicates mild left lower extremity arterial disease. The left toe-brachial index is abnormal. He has not been seen by vascular surgery despite these findings. He presented to clinic today in [name] and is using [name] to offload. Wound VAC was in place. Once this was removed, [name] large ulcer was identified on the left midfoot/ankle. Bone is frankly exposed. There is no malodorous or purulent drainage. There is some granulation tissue over the central portion of the exposed bone. There is [name] tunnel that extends posteriorly for roughly 10 cm. It has been discussed with him by multiple providers that he is at very high risk of losing his lower leg because of this wound. He is extremely eager to avoid this outcome and is here today to review his options as well as receive ongoing wound care. 09/03/2021: Here for reevaluation of his wound. There does not appear to have been any substantial improvement overall since our last visit. He has been in [name] with white foam overlying the exposed bone. We are working on getting him approved for hyperbaric oxygen therapy. 09/10/2021: We are in the process of getting him cleared to begin hyperbaric oxygen therapy. He still needs to obtain [name] chest x-ray. Although the wound measurements are roughly the same, I think the overall appearance of the wound is better. The exposed bone has [name] bit more granulation tissue covering it. He has not received [name] vascular surgery appointment to reevaluate his flow to the wound. 09/17/2021: He has been approved for hyperbaric oxygen therapy and completed his chest x-ray, which I reviewed and it appears normal. The tunnels at the 12 and 10:00 positions are smaller. There is more granulation tissue covering the exposed bone and the undermining has decreased. He still has not received [name] vascular surgery appointment. 09/24/2021: He initiated hyperbaric oxygen therapy this week  and is tolerating it well. He has an appointment with vascular surgery coming up on May 16. The granulation tissue  is covering more of the exposed bone and both tunnels are [name] bit smaller. 10/01/2021: He continues to tolerate hyperbaric oxygen therapy. He saw infectious disease and they are planning to pull his PICC line. He has been initiated on oral antibiotics (doxycycline and Augmentin). The wound looks about the same but the tunnels are [name] little bit smaller. The skin seems to be contracting somewhat around the exposed bone. 10/08/2021: The wound is still about the same size, but the tunnels continue to come in and the skin is contracting around the exposed bone. He continues to have some accumulation of necrotic material in the inferoposterior aspect of the wound as well as accumulation at the 12:00 tunnel area. 10/15/2021: The wound is smaller today. The tunnels continue to come in. There is less necrotic tissue present. He does have some periwound maceration. 10/22/2021: The wound is about the same size. There is [name] little bit less undermining at the distal portion. The exposed bone is dark and I am not sure if this is staining from silver nitrate or his VAC sponge or if it represents necrosis. The tunnels are shallower but he does have some serous drainage coming from the 10:00 tunnel. He continues to tolerate hyperbaric oxygen therapy well. 10/29/2021: The undermining continues to improve. The tunnels are about the same. He has good granulation tissue overlying the majority of the exposed bone. It does appear that perhaps the tubing from his wound VAC has been eroding the skin at the 12 clock position. He continues to accumulate senescent epithelium around the borders of the wound. 11/05/2021: The undermining is almost completely resolved. The tunnels have contracted fairly significantly. No significant slough or debris accumulation. There is still senescent epithelium accumulation around the borders  of the wound. He has been tolerating hyperbaric oxygen therapy well. 11/12/2021: Despite the measurements of the wound being about the same, the wound has changed in its shape and overall, I think it is improved. The undermining has resolved and the tunnels continue to shorten. There is good granulation tissue encroaching over the small area of bone that has remained exposed at the 12 o'clock position. Minimal slough accumulation. He continues to tolerate hyperbaric oxygen therapy well. NOMAR, BROAD [name] [id number] Page 7 of 11 11/19/2021: I took [name] PCR culture last week. There was overgrowth of yeast. He is already taking suppressive doxycycline and Augmentin. I added fluconazole to his regimen. The wound is smaller again today. The tunnels continue to shorten. He continues to do well with hyperbaric oxygen therapy. 11/26/2021: For some reason, his foot has become macerated. The wound is narrower but about the same dimensions in its longitudinal aspect. The tunnels continue to shorten. He has some slough buildup on the wound as well as some heaped up senescent epithelium around the perimeter. 12/03/2021: No further maceration of his foot has occurred. The wound has contracted quite significantly from last week. The tunnel at 10:00 is closed. The tunnel at 12:00 is down to just [name] couple of millimeters. No other undermining is present. There is soft tissue coverage of the previously exposed bone. There is just [name] bit of slough and biofilm on the wound surface. 12/10/2021: The wound is looking good. It turns out the tunnel at 12:00 is only exposed when the patient dorsiflexes his foot. It is about 2 cm in depth when he does this; when his foot is in plantarflexion, the tunnel is closed. The bone that was visible at the 12:00 tunnel is completely covered with granulation  tissue, but there does feel like some exposed bone deeper into the tunnel area. There is senescent skin  heaped up around the periphery. Minimal slough on the wound surface. 12/16/2021: The wound dimensions are roughly the same. The surface has nice granulation tissue. The exposed bone at the 12:00 tunnel continues to be covered with more soft tissue. 7/14; patient's wound measures smaller today. Using the wound VAC with underlying collagen. He is also being treated with HBO for underlying osteomyelitis. He tells me he is on doxycycline and ampicillin follows with infectious disease next week 12/31/2021: The wound continues to contract. Unfortunately, the area where the track pad and tubing have been rubbing continues to look like it is applying friction. He [facility] the tubing, but nonetheless there is ongoing tissue breakdown at this site. Light slough accumulation on the wound surface. The tunnel continues to contract. He is tolerating HBO without difficulty. 01/07/2022: Bridging the wound VAC away from the ankle has resulted in significant improvement in the tissue at the apex of the wound. The tunnel is still present and is not all that much shorter, but the overall wound surface is very robust and healthy looking. Minimal slough accumulation. No concern for acute infection. 01/21/2022: The wound continues to contract and has [name] robust granulation tissue surface. The tunnel has come in considerably and is down to about 1.4 cm. There is still bone exposed within the tunnel but the rest of it is well covered. There is some senescent epithelium at the wound margins and [name] little bit of slough on the surface. 01/28/2022: No significant change in the wound this week, but there has not been any reaccumulation of senescent epithelium or slough. The tunnel is perhaps [name] millimeter less in depth. He has been approved for Apligraf and we will apply this today. 02/04/2022: The wound has contracted somewhat and the tunnel has  filled in completely. The wound surface is clean. He is here for Apligraf #2. 02/11/2022: The wound has contracted further and is now nearly flush with the surrounding skin surface. Light layer of slough. Apligraf #3 plan for today. 02/18/2022: There is [name] band of epithelium trying to cut across the superior portion of the wound. There is robust granulation tissue with just [name] light layer of slough and biofilm on the surface. There was [name] little bit of greenish drainage in the wound VAC but none appreciated on the site itself. He is here for Apligraf #4. 03/04/2022: The wound has contracted considerably. There is good granulation tissue on the surface. Minimal biofilm. He is here for Apligraf #5. 03/18/2022: The wound has epithelialized to the point that it has been divided into 2 areas. Both areas are smaller in total than at his previous visit. There is good granulation tissue present with just [name] little bit of periwound eschar and surface slough. 10/13; left medial foot. The patient has completed treatment with hyperbaric oxygen for underlying osteomyelitis he has had [name] nice response in the wound. Noted today that he still had some greenish drainage. Previously he has been treated with topical gentamicin but apparently that was stopped 2 weeks ago. Fortunately the wound is measuring smaller 10/20; wound looks better no hypergranulation minimal drainage. Granulation tissue looks healthy using gentamicin Hydrofera Blue under compression 04/08/2022: The wound continues to contract tremendously. The response to his treatment for hypertrophic granulation tissue has been quite remarkable. There are just [name] couple  of open areas remaining. 04/18/2022: His wound is healed. READMISSION 07/04/2022: He returns to clinic with [name] new ulcer adjacent to where his previous large wound had been. He says that he was wearing boots and using [name] small padded dressing to protect the freshly healed ankle wound. Apparently the dressing  rolled up and rubbed on his skin, causing [name] new wound. He contacted our office last week concerned about the appearance. There is [name] small oval wound on his left medial ankle. It is fairly clean with some periwound dry skin and eschar. Patient History Information obtained from Patient. Allergies codeine (Reaction: N/V) Family History Cancer - Father, Diabetes - Father,Mother,Paternal Grandparents, Heart Disease - Father, Hypertension - Father, No family history of Hereditary Spherocytosis, Kidney Disease, Lung Disease, Seizures, Stroke, Thyroid Problems, Tuberculosis. Social History Never smoker, Marital Status - Married, Alcohol Use - Rarely, Drug Use - No History, Caffeine Use - Daily. Medical History Eyes Patient has history of Cataracts - Removed 2008 Cardiovascular Patient has history of Coronary Artery Disease, Hypertension, Myocardial Infarction, Peripheral Arterial Disease Endocrine Patient has history of Type II Diabetes Musculoskeletal Patient has history of Osteomyelitis Neurologic Patient has history of Neuropathy RUBERT, FREDIANI [name] ([id number]) [id number] Page 8 of 11 Medical [name] Surgical History Notes nd Cardiovascular Hypercholesterolemia Abnormal EKG CABG X3 2019 Gastrointestinal GERD Musculoskeletal Diabetic foot ulcer Review of Systems (ROS) Constitutional Symptoms (General Health) Denies complaints or symptoms of Fatigue, Fever, Chills, Marked Weight Change. Objective Constitutional Slightly hypertensive. No acute distress. Vitals Time Taken: 1:38 PM, Height: 74 in, Source: Stated, Weight: 187 lbs, Source: Stated, BMI: 24, Temperature: 97.6 F, Pulse: 76 bpm, Respiratory Rate: 18 breaths/min, Blood Pressure: 147/88 mmHg, Capillary Blood Glucose: 85 mg/dl. Respiratory Normal work of breathing on room air. General Notes: 07/04/2022: There is [name] small oval wound on his left medial ankle. It is fairly clean with some periwound dry skin  and eschar. Integumentary (Hair, Skin) Wound #2 status is Open. Original cause of wound was Pressure Injury. The date acquired was: 06/13/2022. The wound is located on the Left,Medial Ankle. The wound measures 2cm length x 1.3cm width x 0.1cm depth; 2.042cm^2 area and 0.204cm^3 volume. There is Fat Layer (Subcutaneous Tissue) exposed. There is no tunneling noted, however, there is undermining starting at 10:00 and ending at 2:00 with [name] maximum distance of 0.3cm. There is [name] medium amount of serous drainage noted. There is large (67-100%) granulation within the wound bed. There is [name] small (1-33%) amount of necrotic tissue within the wound bed including Adherent Slough. The periwound skin appearance had no abnormalities noted for color. The periwound skin appearance exhibited: Dry/Scaly. The periwound skin appearance did not exhibit: Maceration. Periwound temperature was noted as No Abnormality. Assessment Active Problems ICD-10 Non-pressure chronic ulcer of left ankle with fat layer exposed Peripheral vascular disease, unspecified Type 2 diabetes mellitus with other skin ulcer Procedures Wound #2 Pre-procedure diagnosis of Wound #2 is [name] Diabetic Wound/Ulcer of the Lower Extremity located on the Left,Medial Ankle .Severity of Tissue Pre Debridement is: Fat layer exposed. There was [name] Selective/Open Wound Non-Viable Tissue Debridement with [name] total area of 2.6 sq cm performed by [name], MD. With the following instrument(s): Curette to remove Non-Viable tissue/material. Material removed includes [facility] after achieving pain control using Lidocaine 5% topical ointment. No specimens were taken. [name] time out was conducted at 14:06, prior to the start of the procedure. [name] Minimum amount of bleeding was controlled with Pressure. The procedure was tolerated well. Post Debridement Measurements: 2cm length  x 1.3cm width x 0.1cm depth; 0.204cm^3 volume. Character of Wound/Ulcer Post Debridement requires  further debridement. Severity of Tissue Post Debridement is: Fat layer exposed. Post procedure Diagnosis Wound #2: Same as Pre-Procedure General Notes: Scribed for Dr. Lady Gary by Tommie Ard, RN. Plan Follow-up Appointments: Return Appointment in 1 week. - Dr. Lady Gary - room 2 - Anesthetic: Wound #2 Left,Medial Ankle: (In clinic) Topical Lidocaine 5% applied to wound bed - prior to debridement Edema Control - Lymphedema / SCD / Other: Elevate legs to the level of the heart or above for 30 minutes daily and/or when sitting for 3-4 times [name] day throughout the day. Avoid standing for long periods of time. WOUND #2: - Ankle Wound Laterality: Left, Medial MOYSES, PAVEY [name] ([id number]) [id number] Page 9 of 11 Cleanser: Normal Saline 1 x Per Day/30 Days Discharge Instructions: Cleanse the wound with Normal Saline prior to applying [name] clean dressing using gauze sponges, not tissue or cotton balls. Peri-Wound Care: Sween Lotion (Moisturizing lotion) 1 x Per Day/30 Days Discharge Instructions: Apply moisturizing lotion as directed Prim Dressing: Sorbalgon [employer] Dressing 2x2 (in/in) (DME) (Dispense As Written) 1 x Per Day/30 Days ary Discharge Instructions: Apply to wound bed as instructed Secondary Dressing: ABD Pad, 5x9 (DME) (Generic) 1 x Per Day/30 Days Discharge Instructions: Apply over primary dressing as directed. Secured With: Insurance underwriter, Sterile 2x75 (in/in) (DME) (Generic) 1 x Per Day/30 Days Discharge Instructions: Secure with stretch gauze as directed. Secured With: Transpore Surgical T ape, 2x10 (in/yd) 1 x Per Day/30 Days Discharge Instructions: Secure dressing with tape as directed. Com pression Wrap: Kerlix Roll 4.5x3.1 (in/yd) (DME) (Generic) 1 x Per Day/30 Days Discharge Instructions: Apply Kerlix and Coban compression as directed. Com pression Wrap: Coban Self-Adherent Wrap 4x5 (in/yd) (DME) (Generic) 1 x Per Day/30 Days Discharge  Instructions: Apply over Kerlix as directed. 07/04/2022: There is [name] small oval wound on his left medial ankle. It is fairly clean with some periwound dry skin and eschar. I used [name] curette to debride slough and dry skin from the wound. We will use silver alginate with Kerlix and Coban wrapping. Follow-up in 1 week. Electronic Signature(s) Signed: 07/04/2022 2:52:28 PM By: [name] MD FACS Entered By: [name] on 07/04/2022 14:52:27 -------------------------------------------------------------------------------- HxROS Details Patient Name: Date of Service: NA [name], [name]. 07/04/2022 1:30 PM Medical Record Number: [id number] Patient Account Number: [id number] Date of Birth/Sex: Treating RN: July 13, 1955 (67 y.o. [name] Primary Care Provider: [name] Other Clinician: Referring Provider: Treating Provider/Extender: [name]: 0 Information Obtained From Patient Constitutional Symptoms (General Health) Complaints and Symptoms: Negative for: Fatigue; Fever; Chills; Marked Weight Change Eyes Medical History: Positive for: Cataracts - Removed 2008 Cardiovascular Medical History: Positive for: Coronary Artery Disease; Hypertension; Myocardial Infarction; Peripheral Arterial Disease Past Medical History Notes: Hypercholesterolemia Abnormal EKG CABG X3 2019 Gastrointestinal Medical History: Past Medical History Notes: GERD Endocrine Medical History: Positive for: Type II Diabetes Time with diabetes: 24 years [name], [name] ([id number]) [id number] Page 10 of 11 Treated with: Insulin, Oral agents Blood sugar tested every day: Yes Tested : Musculoskeletal Medical History: Positive for: Osteomyelitis Past Medical History Notes: Diabetic foot ulcer Neurologic Medical History: Positive for: Neuropathy HBO Extended History Items Eyes: Cataracts Immunizations Pneumococcal  Vaccine: Received Pneumococcal Vaccination: Yes Received Pneumococcal Vaccination On or After 60th Birthday: Yes Implantable Devices Yes Family and Social History Cancer: Yes - Father; Diabetes: Yes - Father,Mother,Paternal Grandparents; Heart Disease: Yes - Father; Hereditary  Spherocytosis: No; Hypertension: Yes - Father; Kidney Disease: No; Lung Disease: No; Seizures: No; Stroke: No; Thyroid Problems: No; Tuberculosis: No; Never smoker; Marital Status - Married; Alcohol Use: Rarely; Drug Use: No History; Caffeine Use: Daily; Financial Concerns: No; Food, Clothing or Shelter Needs: No; Support System Lacking: No; Transportation Concerns: No Physician Affirmation I have reviewed and agree with the above information. Electronic Signature(s) Signed: 07/04/2022 4:52:32 PM By: Tommie Ard RN Signed: 07/04/2022 5:26:04 PM By: [name] MD FACS Entered By: Tommie Ard on 07/04/2022 13:39:37 -------------------------------------------------------------------------------- SuperBill Details Patient Name: Date of Service: NA [name] 07/04/2022 Medical Record Number: [id number] Patient Account Number: [id number] Date of Birth/Sex: Treating RN: 1955/09/05 (67 y.o. M) Primary Care Provider: [name] Other Clinician: Referring Provider: Treating Provider/Extender: [name]: 0 Diagnosis Coding ICD-10 Codes Code Description [id number] Non-pressure chronic ulcer of left ankle with fat layer exposed I73.9 Peripheral vascular disease, unspecified E11.622 Type 2 diabetes mellitus with other skin ulcer Facility Procedures : CPT4 Code: 73532992 Description: 99213 - WOUND CARE VISIT-LEV 3 EST PT Modifier: 25 Quantity: 1 : VALDEZ, BRANNAN Code: [id number] Garwood [name] ([id number]) L Description: 97597 - DEBRIDE WOUND 1ST 20 SQ CM OR < ICD-10 Diagnosis Description [phone] 97.322 Non-pressure chronic ulcer of left ankle with fat layer  exposed Modifier: [id number] Quantity: 1 7.pdf Page 11 of 11 Physician Procedures : CPT4 Code Description Modifier [id number] 99214 - WC PHYS LEVEL 4 - EST PT 25 ICD-10 Diagnosis Description L97.322 Non-pressure chronic ulcer of left ankle with fat layer exposed E11.622 Type 2 diabetes mellitus with other skin ulcer I73.9 Peripheral  vascular disease, unspecified Quantity: 1 : [id number] 97597 - WC PHYS DEBR WO ANESTH 20 SQ CM ICD-10 Diagnosis Description L97.322 Non-pressure chronic ulcer of left ankle with fat layer exposed Quantity: 1 Electronic Signature(s) Signed: 07/04/2022 4:52:32 PM By: Tommie Ard RN Signed: 07/04/2022 5:26:04 PM By: [name] MD FACS Previous Signature: 07/04/2022 2:52:47 PM Version By: [name] MD FACS Entered By: Tommie Ard on 07/04/2022 15:34:31

## 2022-07-04 NOTE — Progress Notes (Signed)
ERIS, BRECK A ([id number]) [phone].pdf Page 1 of 4 Visit Report for 07/04/2022 Abuse Risk Screen Details Patient Name: Date of Service: [name] 07/04/2022 1:30 PM Medical Record Number: [id number] Patient Account Number: [id number] Date of Birth/Sex: Treating RN: 1955/07/23 (67 y.o. [name] Primary Care Daneisha Surges: [name] Other Clinician: Referring Kabao Leite: Treating Maurizio Geno/Extender: [name] Weeks in Treatment: 0 Abuse Risk Screen Items Answer ABUSE RISK SCREEN: Has anyone close to you tried to hurt or harm you recentlyo No Do you feel uncomfortable with anyone in your familyo No Has anyone forced you do things that you didnt want to doo No Electronic Signature(s) Signed: 07/04/2022 4:52:32 PM By: Blanche East RN Entered By: Blanche East on 07/04/2022 13:39:43 -------------------------------------------------------------------------------- Activities of Daily Living Details Patient Name: Date of Service: Colin Loveless A. 07/04/2022 1:30 PM Medical Record Number: [id number] Patient Account Number: [id number] Date of Birth/Sex: Treating RN: 11-24-1955 (67 y.o. [name] Primary Care Ryelan Kazee: [name] Other Clinician: Referring Leslee Haueter: Treating Kostas Marrow/Extender: [name] Weeks in Treatment: 0 Activities of Daily Living Items Answer Activities of Daily Living (Please select one for each item) Drive Automobile Completely Able T Medications ake Completely Able Use T elephone Completely Able Care for Appearance Completely Able Use T oilet Completely Able Bath / Shower Completely Able Dress Self Completely Able Feed Self Completely Able Walk Completely Able Get In / Out Bed Completely Able Housework Completely Able Prepare Meals Completely Able Handle Money Completely Able Shop for Self Completely Able Electronic Signature(s) Signed: 07/04/2022 4:52:32 PM By:  Blanche East RN Entered By: Blanche East on 07/04/2022 13:40:02 Colin Harms A ([id number]) [id number] Page 2 of 4 -------------------------------------------------------------------------------- Education Screening Details Patient Name: Date of Service: Colin Loveless A. 07/04/2022 1:30 PM Medical Record Number: [id number] Patient Account Number: [id number] Date of Birth/Sex: Treating RN: [birth year] (67 y.o. [name] Primary Care Tashai Catino: [name] Other Clinician: Referring Connee Ikner: Treating Cyndal Kasson/Extender: [name] in Treatment: 0 Learning Preferences/Education Level/Primary Language Learning Preference: Explanation Highest Education Level: College or Above Preferred Language: English Cognitive Barrier Language Barrier: No Translator Needed: No Memory Deficit: No Emotional Barrier: No Cultural/Religious Beliefs Affecting Medical Care: No Physical Barrier Impaired Vision: No Impaired Hearing: No Decreased Hand dexterity: No Knowledge/Comprehension Knowledge Level: High Comprehension Level: High Ability to understand written instructions: High Ability to understand verbal instructions: High Motivation Anxiety Level: Calm Cooperation: Cooperative Education Importance: Acknowledges Need Interest in Health Problems: Asks Questions Perception: Coherent Willingness to Engage in Self-Management High Activities: Readiness to Engage in Self-Management High Activities: Electronic Signature(s) Signed: 07/04/2022 4:52:32 PM By: Blanche East RN Entered By: Blanche East on 07/04/2022 13:40:27 -------------------------------------------------------------------------------- Fall Risk Assessment Details Patient Name: Date of Service: Colin [name] 07/04/2022 1:30 PM Medical Record Number: [id number] Patient Account Number: [id number] Date of Birth/Sex: Treating RN: 1956-02-12 (67 y.o. [name] Primary Care Ishika Chesterfield: [name] Other Clinician: Referring Briannon Boggio: Treating Pasha Gadison/Extender: [name] Weeks in Treatment: 0 Fall Risk Assessment Items Have you had 2 or more falls in the last 12 monthso 0 No Have you had any fall that resulted in injury in the last 12 monthso 0 No [name], Colin A ([id number]) [id number] Page 3 of 4 FALLS RISK SCREEN History of falling - immediate or within 3 months 0 No Secondary diagnosis (Do you have 2 or more medical diagnoseso) 0 No Ambulatory aid None/bed rest/wheelchair/nurse  0 Yes Crutches/cane/walker 0 No Furniture 0 No Intravenous therapy Access/Saline/Heparin Lock 0 No Gait/Transferring Normal/ bed rest/ wheelchair 0 Yes Weak (short steps with or without shuffle, stooped but able to lift head while walking, may seek 0 No support from furniture) Impaired (short steps with shuffle, may have difficulty arising from chair, head down, impaired 0 No balance) Mental Status Oriented to own ability 0 Yes Electronic Signature(s) Signed: 07/04/2022 4:52:32 PM By: Blanche East RN Entered By: Blanche East on 07/04/2022 13:40:40 -------------------------------------------------------------------------------- Foot Assessment Details Patient Name: Date of Service: Colin [name] 07/04/2022 1:30 PM Medical Record Number: [id number] Patient Account Number: [id number] Date of Birth/Sex: Treating RN: June 18, 1955 (67 y.o. [name] Primary Care Mannie Ohlin: [name] Other Clinician: Referring Gaddiel Cullens: Treating Dareth Andrew/Extender: [name] Weeks in Treatment: 0 Foot Assessment Items Site Locations + = Sensation present, - = Sensation absent, C = Callus, U = Ulcer R = Redness, W = Warmth, M = Maceration, PU = Pre-ulcerative lesion F = Fissure, S = Swelling, D = Dryness Assessment Right: Left: Other Deformity: No No Prior Foot Ulcer: No No Prior  Amputation: No No Charcot Joint: No No Ambulatory Status: Ambulatory Without Help GaitCHARISTOPHER, Colin A ([id number]) [id number] Page 4 of 4 Electronic Signature(s) Signed: 07/04/2022 4:52:32 PM By: Blanche East RN Entered By: Blanche East on 07/04/2022 13:42:31 -------------------------------------------------------------------------------- Nutrition Risk Screening Details Patient Name: Date of Service: Colin Loveless A. 07/04/2022 1:30 PM Medical Record Number: [id number] Patient Account Number: [id number] Date of Birth/Sex: Treating RN: [birth year] (67 y.o. [name] Primary Care [name]: [name] Other Clinician: Referring [name]: Treating [name]/Extender: [name] Weeks in Treatment: 0 Height (in): 74 Weight (lbs): 187 Body Mass Index (BMI): 24 Nutrition Risk Screening Items Score Screening NUTRITION RISK SCREEN: I have an illness or condition that made me change the kind and/or amount of food I eat 0 No I eat fewer than two meals per day 0 No I eat few fruits and vegetables, or milk products 0 No I have three or more drinks of beer, liquor or wine almost every day 0 No I have tooth or mouth problems that make it hard for me to eat 0 No I don't always have enough money to buy the food I need 0 No I eat alone most of the time 0 No I take three or more different prescribed or over-the-counter drugs a day 1 Yes Without wanting to, I have lost or gained 10 pounds in the last six months 0 No I am not always physically able to shop, cook and/or feed myself 0 No Nutrition Protocols Good Risk Protocol 0 No interventions needed Moderate Risk Protocol High Risk Proctocol Risk Level: Good Risk Score: 1 Electronic Signature(s) Signed: 07/04/2022 4:52:32 PM By: Blanche East RN Entered By: Blanche East on 07/04/2022 13:40:58

## 2022-07-06 DIAGNOSIS — L97322 Non-pressure chronic ulcer of left ankle with fat layer exposed: Secondary | ICD-10-CM | POA: Diagnosis not present

## 2022-07-11 ENCOUNTER — Encounter (HOSPITAL_BASED_OUTPATIENT_CLINIC_OR_DEPARTMENT_OTHER): Payer: BC Managed Care – PPO | Admitting: General Surgery

## 2022-07-11 DIAGNOSIS — Z8249 Family history of ischemic heart disease and other diseases of the circulatory system: Secondary | ICD-10-CM | POA: Diagnosis not present

## 2022-07-11 DIAGNOSIS — Z951 Presence of aortocoronary bypass graft: Secondary | ICD-10-CM | POA: Diagnosis not present

## 2022-07-11 DIAGNOSIS — Z8619 Personal history of other infectious and parasitic diseases: Secondary | ICD-10-CM | POA: Diagnosis not present

## 2022-07-11 DIAGNOSIS — E1151 Type 2 diabetes mellitus with diabetic peripheral angiopathy without gangrene: Secondary | ICD-10-CM | POA: Diagnosis not present

## 2022-07-11 DIAGNOSIS — I2510 Atherosclerotic heart disease of native coronary artery without angina pectoris: Secondary | ICD-10-CM | POA: Diagnosis not present

## 2022-07-11 DIAGNOSIS — L97322 Non-pressure chronic ulcer of left ankle with fat layer exposed: Secondary | ICD-10-CM | POA: Diagnosis not present

## 2022-07-11 DIAGNOSIS — E1140 Type 2 diabetes mellitus with diabetic neuropathy, unspecified: Secondary | ICD-10-CM | POA: Diagnosis not present

## 2022-07-11 DIAGNOSIS — E11621 Type 2 diabetes mellitus with foot ulcer: Secondary | ICD-10-CM | POA: Diagnosis not present

## 2022-07-11 DIAGNOSIS — E11622 Type 2 diabetes mellitus with other skin ulcer: Secondary | ICD-10-CM | POA: Diagnosis not present

## 2022-07-11 NOTE — Progress Notes (Signed)
BARRON, VANLOAN A ([id number]) [id number] Page 1 of 11 Visit Report for 07/11/2022 Chief Complaint Document Details Patient Name: Date of Service: [name] 07/11/2022 3:30 PM Medical Record Number: [id number] Patient Account Number: [id number] Date of Birth/Sex: Treating RN: 10-10-1955 (67 y.o. M) Primary Care Provider: [name] Other Clinician: Referring Provider: Treating Provider/Extender: [name]: 1 Information Obtained from: Patient Chief Complaint Patients presents for treatment of an open diabetic ulcer with exposed bone and osteomyelitis 07/04/2022: Diabetic ankle ulcer Electronic Signature(s) Signed: 07/11/2022 4:04:01 PM By: [name] MD FACS Entered By: [name] on 07/11/2022 16:04:01 -------------------------------------------------------------------------------- Debridement Details Patient Name: Date of Service: NA Colin Nephew A. 07/11/2022 3:30 PM Medical Record Number: [id number] Patient Account Number: [id number] Date of Birth/Sex: Treating RN: 26-Apr-1956 (67 y.o. [name] Primary Care Provider: [name] Other Clinician: Referring Provider: Treating Provider/Extender: [name]: 1 Debridement Performed for Assessment: Wound #2 Left,Medial Ankle Performed By: Physician [name], MD Debridement Type: Debridement Severity of Tissue Pre Debridement: Fat layer exposed Level of Consciousness (Pre-procedure): Awake and Alert Pre-procedure Verification/Time Out Yes - 15:50 Taken: Start Time: 15:50 Pain Control: Lidocaine 5% topical ointment T Area Debrided (L x W): otal 2 (cm) x 1.2 (cm) = 2.4 (cm) Tissue and other material debrided: Non-Viable, Slough, Slough Level: Non-Viable Tissue Debridement Description: Selective/Open Wound Instrument: Curette Specimen: Tissue Culture Number of Specimens T aken: 1 Bleeding:  Minimum Hemostasis Achieved: Pressure Response to Treatment: Procedure was tolerated well Level of Consciousness (Post- Awake and Alert procedure): Post Debridement Measurements of Total Wound Length: (cm) 2 Width: (cm) 1.2 Depth: (cm) 0.1 Volume: (cm) 0.188 Character of Wound/Ulcer Post Debridement: Improved NICCOLO, BURGGRAF A ([id number]) [id number] Page 2 of 11 Severity of Tissue Post Debridement: Fat layer exposed Post Procedure Diagnosis Same as Pre-procedure Notes scribed for Dr. Lady Gary by Samuella Bruin, RN Electronic Signature(s) Signed: 07/11/2022 4:07:33 PM By: [name] MD FACS Signed: 07/11/2022 4:46:33 PM By: Samuella Bruin Entered By: Samuella Bruin on 07/11/2022 15:51:52 -------------------------------------------------------------------------------- HPI Details Patient Name: Date of Service: NA Colin Nephew A. 07/11/2022 3:30 PM Medical Record Number: [id number] Patient Account Number: [id number] Date of Birth/Sex: Treating RN: [birth year] (67 y.o. M) Primary Care Provider: [name] Other Clinician: Referring Provider: Treating Provider/Extender: [name]: 1 History of Present Illness HPI Description: ADMISSION 08/25/2021 This is a 67 year old man who initially presented to his primary care provider in September 2022 with pain in his left foot. He was sent for an x-ray and while the x-ray was being performed, the tech pointed out a wound on his foot that the patient was not aware existed. He does have type 2 diabetes with significant neuropathy. His diabetes is suboptimally controlled with his most recent A1c being 8.5. He also has a history of coronary artery disease status post three- vessel CABG. he was initially seen by orthopedics, but they referred him to [facility]. He has undergone at least 7 operations/debridements and several applications of skin  substitute under the care of podiatry. He has been in a wound VAC for much of this time. His most recent procedure was July 28, 2021. A portion of the talus was biopsied and was found to be consistent with osteomyelitis. Culture also returned positive for corynebacterium. He was seen on August 16, 2021 by infectious disease. A PICC line has been placed and he will be receiving a  6-week course of IV daptomycin and cefepime. In October 2022, he underwent lower extremity vascular studies. Results are copied here: Right: Resting right ankle-brachial index is within normal range. No evidence of significant right lower extremity arterial disease. The right toe-brachial index is abnormal. Left: Resting left ankle-brachial index indicates mild left lower extremity arterial disease. The left toe-brachial index is abnormal. He has not been seen by vascular surgery despite these findings. He presented to clinic today in a cam boot and is using a knee scooter to offload. Wound VAC was in place. Once this was removed, a large ulcer was identified on the left midfoot/ankle. Bone is frankly exposed. There is no malodorous or purulent drainage. There is some granulation tissue over the central portion of the exposed bone. There is a tunnel that extends posteriorly for roughly 10 cm. It has been discussed with him by multiple providers that he is at very high risk of losing his lower leg because of this wound. He is extremely eager to avoid this outcome and is here today to review his options as well as receive ongoing wound care. 09/03/2021: Here for reevaluation of his wound. There does not appear to have been any substantial improvement overall since our last visit. He has been in a wound VAC with white foam overlying the exposed bone. We are working on getting him approved for hyperbaric oxygen therapy. 09/10/2021: We are in the process of getting him cleared to begin hyperbaric oxygen therapy. He still needs to  obtain a chest x-ray. Although the wound measurements are roughly the same, I think the overall appearance of the wound is better. The exposed bone has a bit more granulation tissue covering it. He has not received a vascular surgery appointment to reevaluate his flow to the wound. 09/17/2021: He has been approved for hyperbaric oxygen therapy and completed his chest x-ray, which I reviewed and it appears normal. The tunnels at the 12 and 10:00 positions are smaller. There is more granulation tissue covering the exposed bone and the undermining has decreased. He still has not received a vascular surgery appointment. 09/24/2021: He initiated hyperbaric oxygen therapy this week and is tolerating it well. He has an appointment with vascular surgery coming up on May 16. The granulation tissue is covering more of the exposed bone and both tunnels are a bit smaller. 10/01/2021: He continues to tolerate hyperbaric oxygen therapy. He saw infectious disease and they are planning to pull his PICC line. He has been initiated on oral antibiotics (doxycycline and Augmentin). The wound looks about the same but the tunnels are a little bit smaller. The skin seems to be contracting somewhat around the exposed bone. 10/08/2021: The wound is still about the same size, but the tunnels continue to come in and the skin is contracting around the exposed bone. He continues to have some accumulation of necrotic material in the inferoposterior aspect of the wound as well as accumulation at the 12:00 tunnel area. 10/15/2021: The wound is smaller today. The tunnels continue to come in. There is less necrotic tissue present. He does have some periwound maceration. CORBAN, KISTLER A ([id number]) [id number] Page 3 of 11 10/22/2021: The wound is about the same size. There is a little bit less undermining at the distal portion. The exposed bone is dark and I am not sure if this is staining from silver nitrate or  his VAC sponge or if it represents necrosis. The tunnels are shallower but he does have some serous drainage coming from  the 10:00 tunnel. He continues to tolerate hyperbaric oxygen therapy well. 10/29/2021: The undermining continues to improve. The tunnels are about the same. He has good granulation tissue overlying the majority of the exposed bone. It does appear that perhaps the tubing from his wound VAC has been eroding the skin at the 12 clock position. He continues to accumulate senescent epithelium around the borders of the wound. 11/05/2021: The undermining is almost completely resolved. The tunnels have contracted fairly significantly. No significant slough or debris accumulation. There is still senescent epithelium accumulation around the borders of the wound. He has been tolerating hyperbaric oxygen therapy well. 11/12/2021: Despite the measurements of the wound being about the same, the wound has changed in its shape and overall, I think it is improved. The undermining has resolved and the tunnels continue to shorten. There is good granulation tissue encroaching over the small area of bone that has remained exposed at the 12 o'clock position. Minimal slough accumulation. He continues to tolerate hyperbaric oxygen therapy well. 11/19/2021: I took a PCR culture last week. There was overgrowth of yeast. He is already taking suppressive doxycycline and Augmentin. I added fluconazole to his regimen. The wound is smaller again today. The tunnels continue to shorten. He continues to do well with hyperbaric oxygen therapy. 11/26/2021: For some reason, his foot has become macerated. The wound is narrower but about the same dimensions in its longitudinal aspect. The tunnels continue to shorten. He has some slough buildup on the wound as well as some heaped up senescent epithelium around the perimeter. 12/03/2021: No further maceration of his foot has occurred. The wound has contracted quite significantly  from last week. The tunnel at 10:00 is closed. The tunnel at 12:00 is down to just a couple of millimeters. No other undermining is present. There is soft tissue coverage of the previously exposed bone. There is just a bit of slough and biofilm on the wound surface. 12/10/2021: The wound is looking good. It turns out the tunnel at 12:00 is only exposed when the patient dorsiflexes his foot. It is about 2 cm in depth when he does this; when his foot is in plantarflexion, the tunnel is closed. The bone that was visible at the 12:00 tunnel is completely covered with granulation tissue, but there does feel like some exposed bone deeper into the tunnel area. There is senescent skin heaped up around the periphery. Minimal slough on the wound surface. 12/16/2021: The wound dimensions are roughly the same. The surface has nice granulation tissue. The exposed bone at the 12:00 tunnel continues to be covered with more soft tissue. 7/14; patient's wound measures smaller today. Using the wound VAC with underlying collagen. He is also being treated with HBO for underlying osteomyelitis. He tells me he is on doxycycline and ampicillin follows with infectious disease next week 12/31/2021: The wound continues to contract. Unfortunately, the area where the track pad and tubing have been rubbing continues to look like it is applying friction. He says that the [facility] health [facility] been putting gauze underneath the tubing, but nonetheless there is ongoing tissue breakdown at this site. Light slough accumulation on the wound surface. The tunnel continues to contract. He is tolerating HBO without difficulty. 01/07/2022: Bridging the wound VAC away from the ankle has resulted in significant improvement in the tissue at the apex of the wound. The tunnel is still present and is not all that much shorter, but the overall wound surface is  very robust and healthy looking. Minimal slough  accumulation. No concern for acute infection. 01/21/2022: The wound continues to contract and has a robust granulation tissue surface. The tunnel has come in considerably and is down to about 1.4 cm. There is still bone exposed within the tunnel but the rest of it is well covered. There is some senescent epithelium at the wound margins and a little bit of slough on the surface. 01/28/2022: No significant change in the wound this week, but there has not been any reaccumulation of senescent epithelium or slough. The tunnel is perhaps a millimeter less in depth. He has been approved for Apligraf and we will apply this today. 02/04/2022: The wound has contracted somewhat and the tunnel has filled in completely. The wound surface is clean. He is here for Apligraf #2. 02/11/2022: The wound has contracted further and is now nearly flush with the surrounding skin surface. Light layer of slough. Apligraf #3 plan for today. 02/18/2022: There is a band of epithelium trying to cut across the superior portion of the wound. There is robust granulation tissue with just a light layer of slough and biofilm on the surface. There was a little bit of greenish drainage in the wound VAC but none appreciated on the site itself. He is here for Apligraf #4. 03/04/2022: The wound has contracted considerably. There is good granulation tissue on the surface. Minimal biofilm. He is here for Apligraf #5. 03/18/2022: The wound has epithelialized to the point that it has been divided into 2 areas. Both areas are smaller in total than at his previous visit. There is good granulation tissue present with just a little bit of periwound eschar and surface slough. 10/13; left medial foot. The patient has completed treatment with hyperbaric oxygen for underlying osteomyelitis he has had a nice response in the wound. Noted today that he still had some greenish drainage. Previously he has been treated with topical gentamicin but apparently that was  stopped 2 weeks ago. Fortunately the wound is measuring smaller 10/20; wound looks better no hypergranulation minimal drainage. Granulation tissue looks healthy using gentamicin Hydrofera Blue under compression 04/08/2022: The wound continues to contract tremendously. The response to his treatment for hypertrophic granulation tissue has been quite remarkable. There are just a couple of open areas remaining. 04/18/2022: His wound is healed. READMISSION 07/04/2022: He returns to clinic with a new ulcer adjacent to where his previous large wound had been. He says that he was wearing boots and using a small padded dressing to protect the freshly healed ankle wound. Apparently the dressing rolled up and rubbed on his skin, causing a new wound. He contacted our office last week concerned about the appearance. There is a small oval wound on his left medial ankle. It is fairly clean with some periwound dry skin and eschar. 07/11/2022: The wound is about the same size this week. There is a layer of rubbery slough on the surface. It has a fairly strong odor. Electronic Signature(s) Signed: 07/11/2022 4:04:35 PM By: [name] MD FACS Entered By: [name] on 07/11/2022 16:04:35 Gorden Colin A ([id number]) [id number] Page 4 of 11 -------------------------------------------------------------------------------- Physical Exam Details Patient Name: Date of Service: Colin Loveless A. 07/11/2022 3:30 PM Medical Record Number: [id number] Patient Account Number: [id number] Date of Birth/Sex: Treating RN: [birth year] (67 y.o. M) Primary Care Provider: [name] Other Clinician: Referring Provider: Treating Provider/Extender: [name] Weeks in Treatment: 1 Constitutional Hypertensive, asymptomatic. . . . no acute distress. Respiratory  Normal work of breathing on room air. Notes 07/11/2022: The wound is about the same size this week. There is a  layer of rubbery slough on the surface. It has a fairly strong odor. Electronic Signature(s) Signed: 07/11/2022 4:05:11 PM By: Duanne Guessannon, Yuritzi Kamp MD FACS Entered By: Duanne Guessannon, Jassmine Vandruff on 07/11/2022 16:05:10 -------------------------------------------------------------------------------- Physician Orders Details Patient Name: Date of Service: NA Colin NephewRO N, [name] 07/11/2022 3:30 PM Medical Record Number: [id number] Patient Account Number: [id number] Date of Birth/Sex: Treating RN: 1956/03/31 (67 y.o. Marlan PalauM) Herrington, Taylor Primary Care Provider: Simone CuriaLee, Keung Other Clinician: Referring Provider: Treating Provider/Extender: [name], Jem Castro Lee, Keung Weeks in Treatment: 1 Verbal / Phone Orders: No Diagnosis Coding ICD-10 Coding Code Description [phone] Non-pressure chronic ulcer of left ankle with fat layer exposed I73.9 Peripheral vascular disease, unspecified E11.622 Type 2 diabetes mellitus with other skin ulcer Follow-up Appointments ppointment in 1 week. - Dr. Lady Garyannon - room 2 - Return A Anesthetic Wound #2 Left,Medial Ankle (In clinic) Topical Lidocaine 5% applied to wound bed - prior to debridement Bathing/ Shower/ Hygiene May shower and wash wound with soap and water. Edema Control - Lymphedema / SCD / Other Elevate legs to the level of the heart or above for 30 minutes daily and/or when sitting for 3-4 times a day throughout the day. Avoid standing for long periods of time. Wound Treatment Wound #2 - Ankle Wound Laterality: Left, Medial Cleanser: Normal Saline 1 x Per Day/30 Days Discharge Instructions: Cleanse the wound with Normal Saline prior to applying a clean dressing using gauze sponges, not tissue or cotton balls. [name], Rumi A ([id number]) [id number] Page 5 of 11 Peri-Wound Care: Sween Lotion (Moisturizing lotion) 1 x Per Day/30 Days Discharge Instructions: Apply moisturizing lotion as directed Topical: Gentamicin 1 x Per Day/30  Days Discharge Instructions: As directed by physician Prim Dressing: Sorbalgon AG Dressing 2x2 (in/in) (Dispense As Written) 1 x Per Day/30 Days ary Discharge Instructions: Apply to wound bed as instructed Secondary Dressing: ABD Pad, 5x9 (Generic) 1 x Per Day/30 Days Discharge Instructions: Apply over primary dressing as directed. Secured With: Transpore Surgical Tape, 2x10 (in/yd) 1 x Per Day/30 Days Discharge Instructions: Secure dressing with tape as directed. Compression Wrap: Kerlix Roll 4.5x3.1 (in/yd) (Generic) 1 x Per Day/30 Days Discharge Instructions: Apply Kerlix and Coban compression as directed. Compression Wrap: Coban Self-Adherent Wrap 4x5 (in/yd) (Generic) 1 x Per Day/30 Days Discharge Instructions: Apply over Kerlix as directed. Patient Medications llergies: codeine A Notifications Medication Indication Start End 07/11/2022 lidocaine DOSE topical 5 % ointment - ointment topical Electronic Signature(s) Signed: 07/11/2022 4:07:33 PM By: Duanne Guessannon, Paden Kuras MD FACS Entered By: Duanne Guessannon, Axtyn Woehler on 07/11/2022 16:05:23 -------------------------------------------------------------------------------- Problem List Details Patient Name: Date of Service: NA Colin NephewRO N, [name] 07/11/2022 3:30 PM Medical Record Number: [id number] Patient Account Number: [id number] Date of Birth/Sex: Treating RN: 1956/03/31 (67 y.o. M) Primary Care Provider: Simone CuriaLee, Keung Other Clinician: Referring Provider: Treating Provider/Extender: [name], Jaylei Fuerte Lee, Keung Weeks in Treatment: 1 Active Problems ICD-10 Encounter Code Description Active Date MDM Diagnosis L97.322 Non-pressure chronic ulcer of left ankle with fat layer exposed 07/04/2022 No Yes I73.9 Peripheral vascular disease, unspecified 07/04/2022 No Yes E11.622 Type 2 diabetes mellitus with other skin ulcer 07/04/2022 No Yes Inactive Problems Resolved Problems [name], Kutter A ([id number]) [id number] Page 6 of  11 Electronic Signature(s) Signed: 07/11/2022 4:03:47 PM By: Duanne Guessannon, Mariza Bourget MD FACS Entered By: Duanne Guessannon, Jenella Craigie on 07/11/2022 16:03:47 -------------------------------------------------------------------------------- Progress Note Details Patient Name: Date of Service: [name], [name] 07/11/2022 3:30  PM Medical Record Number: [id number] Patient Account Number: [id number] Date of Birth/Sex: Treating RN: 1955-12-24 (67 y.o. M) Primary Care Provider: [name] Other Clinician: Referring Provider: Treating Provider/Extender: [name]: 1 Subjective Chief Complaint Information obtained from Patient Patients presents for treatment of an open diabetic ulcer with exposed bone and osteomyelitis 07/04/2022: Diabetic ankle ulcer History of Present Illness (HPI) ADMISSION 08/25/2021 This is a 67 year old man who initially presented to his primary care provider in September 2022 with pain in his left foot. He was sent for an x-ray and while the x-ray was being performed, the tech pointed out a wound on his foot that the patient was not aware existed. He does have type 2 diabetes with significant neuropathy. His diabetes is suboptimally controlled with his most recent A1c being 8.5. He also has a history of coronary artery disease status post three- vessel CABG. he was initially seen by orthopedics, but they referred him to [facility]. He has undergone at least 7 operations/debridements and several applications of skin substitute under the care of podiatry. He has been in a wound VAC for much of this time. His most recent procedure was July 28, 2021. A portion of the talus was biopsied and was found to be consistent with osteomyelitis. Culture also returned positive for corynebacterium. He was seen on August 16, 2021 by infectious disease. A PICC line has been placed and he will be receiving a 6-week course of IV daptomycin and cefepime. In  October 2022, he underwent lower extremity vascular studies. Results are copied here: Right: Resting right ankle-brachial index is within normal range. No evidence of significant right lower extremity arterial disease. The right toe-brachial index is abnormal. Left: Resting left ankle-brachial index indicates mild left lower extremity arterial disease. The left toe-brachial index is abnormal. He has not been seen by vascular surgery despite these findings. He presented to clinic today in a cam boot and is using a knee scooter to offload. Wound VAC was in place. Once this was removed, a large ulcer was identified on the left midfoot/ankle. Bone is frankly exposed. There is no malodorous or purulent drainage. There is some granulation tissue over the central portion of the exposed bone. There is a tunnel that extends posteriorly for roughly 10 cm. It has been discussed with him by multiple providers that he is at very high risk of losing his lower leg because of this wound. He is extremely eager to avoid this outcome and is here today to review his options as well as receive ongoing wound care. 09/03/2021: Here for reevaluation of his wound. There does not appear to have been any substantial improvement overall since our last visit. He has been in a wound VAC with white foam overlying the exposed bone. We are working on getting him approved for hyperbaric oxygen therapy. 09/10/2021: We are in the process of getting him cleared to begin hyperbaric oxygen therapy. He still needs to obtain a chest x-ray. Although the wound measurements are roughly the same, I think the overall appearance of the wound is better. The exposed bone has a bit more granulation tissue covering it. He has not received a vascular surgery appointment to reevaluate his flow to the wound. 09/17/2021: He has been approved for hyperbaric oxygen therapy and completed his chest x-ray, which I reviewed and it appears normal. The tunnels at  the 12 and 10:00 positions are smaller. There is more granulation tissue covering the exposed bone and  the undermining has decreased. He still has not received a vascular surgery appointment. 09/24/2021: He initiated hyperbaric oxygen therapy this week and is tolerating it well. He has an appointment with vascular surgery coming up on May 16. The granulation tissue is covering more of the exposed bone and both tunnels are a bit smaller. 10/01/2021: He continues to tolerate hyperbaric oxygen therapy. He saw infectious disease and they are planning to pull his PICC line. He has been initiated on oral antibiotics (doxycycline and Augmentin). The wound looks about the same but the tunnels are a little bit smaller. The skin seems to be contracting somewhat around the exposed bone. 10/08/2021: The wound is still about the same size, but the tunnels continue to come in and the skin is contracting around the exposed bone. He continues to have some accumulation of necrotic material in the inferoposterior aspect of the wound as well as accumulation at the 12:00 tunnel area. 10/15/2021: The wound is smaller today. The tunnels continue to come in. There is less necrotic tissue present. He does have some periwound maceration. 10/22/2021: The wound is about the same size. There is a little bit less undermining at the distal portion. The exposed bone is dark and I am not sure if this is staining from silver nitrate or his VAC sponge or if it represents necrosis. The tunnels are shallower but he does have some serous drainage coming from the 10:00 tunnel. He continues to tolerate hyperbaric oxygen therapy well. 10/29/2021: The undermining continues to improve. The tunnels are about the same. He has good granulation tissue overlying the majority of the exposed bone. It does appear that perhaps the tubing from his wound VAC has been eroding the skin at the 12 clock position. He continues to accumulate senescent epithelium  around the borders of the wound. WAH, SABIC A ([id number]) [id number] Page 7 of 11 11/05/2021: The undermining is almost completely resolved. The tunnels have contracted fairly significantly. No significant slough or debris accumulation. There is still senescent epithelium accumulation around the borders of the wound. He has been tolerating hyperbaric oxygen therapy well. 11/12/2021: Despite the measurements of the wound being about the same, the wound has changed in its shape and overall, I think it is improved. The undermining has resolved and the tunnels continue to shorten. There is good granulation tissue encroaching over the small area of bone that has remained exposed at the 12 o'clock position. Minimal slough accumulation. He continues to tolerate hyperbaric oxygen therapy well. 11/19/2021: I took a PCR culture last week. There was overgrowth of yeast. He is already taking suppressive doxycycline and Augmentin. I added fluconazole to his regimen. The wound is smaller again today. The tunnels continue to shorten. He continues to do well with hyperbaric oxygen therapy. 11/26/2021: For some reason, his foot has become macerated. The wound is narrower but about the same dimensions in its longitudinal aspect. The tunnels continue to shorten. He has some slough buildup on the wound as well as some heaped up senescent epithelium around the perimeter. 12/03/2021: No further maceration of his foot has occurred. The wound has contracted quite significantly from last week. The tunnel at 10:00 is closed. The tunnel at 12:00 is down to just a couple of millimeters. No other undermining is present. There is soft tissue coverage of the previously exposed bone. There is just a bit of slough and biofilm on the wound surface. 12/10/2021: The wound is looking good. It turns out the tunnel at 12:00 is only exposed  when the patient dorsiflexes his foot. It is about 2 cm in depth when  he does this; when his foot is in plantarflexion, the tunnel is closed. The bone that was visible at the 12:00 tunnel is completely covered with granulation tissue, but there does feel like some exposed bone deeper into the tunnel area. There is senescent skin heaped up around the periphery. Minimal slough on the wound surface. 12/16/2021: The wound dimensions are roughly the same. The surface has nice granulation tissue. The exposed bone at the 12:00 tunnel continues to be covered with more soft tissue. 7/14; patient's wound measures smaller today. Using the wound VAC with underlying collagen. He is also being treated with HBO for underlying osteomyelitis. He tells me he is on doxycycline and ampicillin follows with infectious disease next week 12/31/2021: The wound continues to contract. Unfortunately, the area where the track pad and tubing have been rubbing continues to look like it is applying friction. He says that the home health nurses that have been applying the [facility] have been putting gauze underneath the tubing, but nonetheless there is ongoing tissue breakdown at this site. Light slough accumulation on the wound surface. The tunnel continues to contract. He is tolerating HBO without difficulty. 01/07/2022: Bridging the wound VAC away from the ankle has resulted in significant improvement in the tissue at the apex of the wound. The tunnel is still present and is not all that much shorter, but the overall wound surface is very robust and healthy looking. Minimal slough accumulation. No concern for acute infection. 01/21/2022: The wound continues to contract and has a robust granulation tissue surface. The tunnel has come in considerably and is down to about 1.4 cm. There is still bone exposed within the tunnel but the rest of it is well covered. There is some senescent epithelium at the wound margins and a little bit of slough on the surface. 01/28/2022: No significant change in the wound this  week, but there has not been any reaccumulation of senescent epithelium or slough. The tunnel is perhaps a millimeter less in depth. He has been approved for Apligraf and we will apply this today. 02/04/2022: The wound has contracted somewhat and the tunnel has filled in completely. The wound surface is clean. He is here for Apligraf #2. 02/11/2022: The wound has contracted further and is now nearly flush with the surrounding skin surface. Light layer of slough. Apligraf #3 plan for today. 02/18/2022: There is a band of epithelium trying to cut across the superior portion of the wound. There is robust granulation tissue with just a light layer of slough and biofilm on the surface. There was a little bit of greenish drainage in the wound VAC but none appreciated on the site itself. He is here for Apligraf #4. 03/04/2022: The wound has contracted considerably. There is good granulation tissue on the surface. Minimal biofilm. He is here for Apligraf #5. 03/18/2022: The wound has epithelialized to the point that it has been divided into 2 areas. Both areas are smaller in total than at his previous visit. There is good granulation tissue present with just a little bit of periwound eschar and surface slough. 10/13; left medial foot. The patient has completed treatment with hyperbaric oxygen for underlying osteomyelitis he has had a nice response in the wound. Noted today that he still had some greenish drainage. Previously he has been treated with topical gentamicin but apparently that was stopped 2 weeks ago. Fortunately the wound is measuring smaller 10/20; wound  looks better no hypergranulation minimal drainage. Granulation tissue looks healthy using gentamicin Hydrofera Blue under compression 04/08/2022: The wound continues to contract tremendously. The response to his treatment for hypertrophic granulation tissue has been quite remarkable. There are just a couple of open areas remaining. 04/18/2022: His wound  is healed. READMISSION 07/04/2022: He returns to clinic with a new ulcer adjacent to where his previous large wound had been. He says that he was wearing boots and using a small padded dressing to protect the freshly healed ankle wound. Apparently the dressing rolled up and rubbed on his skin, causing a new wound. He contacted our office last week concerned about the appearance. There is a small oval wound on his left medial ankle. It is fairly clean with some periwound dry skin and eschar. 07/11/2022: The wound is about the same size this week. There is a layer of rubbery slough on the surface. It has a fairly strong odor. Patient History Information obtained from Patient. Family History Cancer - Father, Diabetes - Father,Mother,Paternal Grandparents, Heart Disease - Father, Hypertension - Father, No family history of Hereditary Spherocytosis, Kidney Disease, Lung Disease, Seizures, Stroke, Thyroid Problems, Tuberculosis. Social History Never smoker, Marital Status - Married, Alcohol Use - Rarely, Drug Use - No History, Caffeine Use - Daily. Medical History Eyes Patient has history of Cataracts - Removed 2008 Cardiovascular Patient has history of Coronary Artery Disease, Hypertension, Myocardial Infarction, Peripheral Arterial Disease Endocrine [name], Ebrahim A ([id number]) [id number] Page 8 of 11 Patient has history of Type II Diabetes Musculoskeletal Patient has history of Osteomyelitis Neurologic Patient has history of Neuropathy Medical A Surgical History Notes nd Cardiovascular Hypercholesterolemia Abnormal EKG CABG X3 2019 Gastrointestinal GERD Musculoskeletal Diabetic foot ulcer Objective Constitutional Hypertensive, asymptomatic. no acute distress. Vitals Time Taken: 3:35 PM, Height: 74 in, Weight: 187 lbs, BMI: 24, Temperature: 97.8 F, Pulse: 82 bpm, Respiratory Rate: 16 breaths/min, Blood Pressure: 157/82 mmHg, Capillary Blood Glucose: 80  mg/dl. Respiratory Normal work of breathing on room air. General Notes: 07/11/2022: The wound is about the same size this week. There is a layer of rubbery slough on the surface. It has a fairly strong odor. Integumentary (Hair, Skin) Wound #2 status is Open. Original cause of wound was Pressure Injury. The date acquired was: 06/13/2022. The wound has been in treatment 1 weeks. The wound is located on the Left,Medial Ankle. The wound measures 2cm length x 1.2cm width x 0.1cm depth; 1.885cm^2 area and 0.188cm^3 volume. There is Fat Layer (Subcutaneous Tissue) exposed. There is no tunneling or undermining noted. There is a medium amount of serous drainage noted. The wound margin is distinct with the outline attached to the wound base. There is large (67-100%) granulation within the wound bed. There is a small (1-33%) amount of necrotic tissue within the wound bed including Adherent Slough. The periwound skin appearance had no abnormalities noted for color. The periwound skin appearance exhibited: Maceration. The periwound skin appearance did not exhibit: Dry/Scaly. Periwound temperature was noted as No Abnormality. Assessment Active Problems ICD-10 Non-pressure chronic ulcer of left ankle with fat layer exposed Peripheral vascular disease, unspecified Type 2 diabetes mellitus with other skin ulcer Procedures Wound #2 Pre-procedure diagnosis of Wound #2 is a Diabetic Wound/Ulcer of the Lower Extremity located on the Left,Medial Ankle .Severity of Tissue Pre Debridement is: Fat layer exposed. There was a Selective/Open Wound Non-Viable Tissue Debridement with a total area of 2.4 sq cm performed by Duanne Guessannon, Jazper Nikolai, MD. With the following instrument(s): Curette to remove Non-Viable tissue/material. Material  removed includes Slough after achieving pain control using Lidocaine 5% topical ointment. 1 specimen was taken by a Tissue Culture and sent to the lab per facility protocol. A time out was conducted  at 15:50, prior to the start of the procedure. A Minimum amount of bleeding was controlled with Pressure. The procedure was tolerated well. Post Debridement Measurements: 2cm length x 1.2cm width x 0.1cm depth; 0.188cm^3 volume. Character of Wound/Ulcer Post Debridement is improved. Severity of Tissue Post Debridement is: Fat layer exposed. Post procedure Diagnosis Wound #2: Same as Pre-Procedure General Notes: scribed for Dr. Lady Garyannon by Samuella Bruinaylor Herrington, RN. Plan Follow-up Appointments: Return Appointment in 1 week. - Dr. Lady Garyannon - room 2 - Anesthetic: Wound #2 Left,Medial Ankle: [name], Koston A ([id number]) [id number] Page 9 of 11 (In clinic) Topical Lidocaine 5% applied to wound bed - prior to debridement Bathing/ Shower/ Hygiene: May shower and wash wound with soap and water. Edema Control - Lymphedema / SCD / Other: Elevate legs to the level of the heart or above for 30 minutes daily and/or when sitting for 3-4 times a day throughout the day. Avoid standing for long periods of time. The following medication(s) was prescribed: lidocaine topical 5 % ointment ointment topical was prescribed at facility WOUND #2: - Ankle Wound Laterality: Left, Medial Cleanser: Normal Saline 1 x Per Day/30 Days Discharge Instructions: Cleanse the wound with Normal Saline prior to applying a clean dressing using gauze sponges, not tissue or cotton balls. Peri-Wound Care: Sween Lotion (Moisturizing lotion) 1 x Per Day/30 Days Discharge Instructions: Apply moisturizing lotion as directed Topical: Gentamicin 1 x Per Day/30 Days Discharge Instructions: As directed by physician Prim Dressing: Sorbalgon AG Dressing 2x2 (in/in) (Dispense As Written) 1 x Per Day/30 Days ary Discharge Instructions: Apply to wound bed as instructed Secondary Dressing: ABD Pad, 5x9 (Generic) 1 x Per Day/30 Days Discharge Instructions: Apply over primary dressing as directed. Secured With: Transpore  Surgical T ape, 2x10 (in/yd) 1 x Per Day/30 Days Discharge Instructions: Secure dressing with tape as directed. Com pression Wrap: Kerlix Roll 4.5x3.1 (in/yd) (Generic) 1 x Per Day/30 Days Discharge Instructions: Apply Kerlix and Coban compression as directed. Com pression Wrap: Coban Self-Adherent Wrap 4x5 (in/yd) (Generic) 1 x Per Day/30 Days Discharge Instructions: Apply over Kerlix as directed. 07/11/2022: The wound is about the same size this week. There is a layer of rubbery slough on the surface. It has a fairly strong odor. I used a curette to debride slough from the wound surface. I then took a culture. Although he is still on oral doxycycline, I am concerned that perhaps there is an infection that is not covered by this antibiotic. We will apply topical gentamicin to the site and once his culture returns, I will adjust any antibiotic therapy as indicated. Continue silver alginate with Kerlix and Ace wrapping. Follow-up in 1 week. Electronic Signature(s) Signed: 07/11/2022 4:06:16 PM By: Duanne Guessannon, Ryan Palermo MD FACS Entered By: Duanne Guessannon, Arlita Buffkin on 07/11/2022 16:06:15 -------------------------------------------------------------------------------- HxROS Details Patient Name: Date of Service: NA RRO Colin HalstedN, [name] 07/11/2022 3:30 PM Medical Record Number: [id number] Patient Account Number: [id number] Date of Birth/Sex: Treating RN: 15-Oct-1955 (67 y.o. M) Primary Care Provider: Simone CuriaLee, Keung Other Clinician: Referring Provider: Treating Provider/Extender: [name], Joon Pohle Lee, Keung Weeks in Treatment: 1 Information Obtained From Patient Eyes Medical History: Positive for: Cataracts - Removed 2008 Cardiovascular Medical History: Positive for: Coronary Artery Disease; Hypertension; Myocardial Infarction; Peripheral Arterial Disease Past Medical History Notes: Hypercholesterolemia Abnormal EKG CABG X3 2019 Gastrointestinal Medical History:  Past Medical History  Notes: GERD Endocrine SCHAWN, BYAS A ([id number]) [id number] Page 10 of 11 Medical History: Positive for: Type II Diabetes Time with diabetes: 24 years Treated with: Insulin, Oral agents Blood sugar tested every day: Yes Tested : Musculoskeletal Medical History: Positive for: Osteomyelitis Past Medical History Notes: Diabetic foot ulcer Neurologic Medical History: Positive for: Neuropathy HBO Extended History Items Eyes: Cataracts Immunizations Pneumococcal Vaccine: Received Pneumococcal Vaccination: Yes Received Pneumococcal Vaccination On or After 60th Birthday: Yes Implantable Devices Yes Family and Social History Cancer: Yes - Father; Diabetes: Yes - Father,Mother,Paternal Grandparents; Heart Disease: Yes - Father; Hereditary Spherocytosis: No; Hypertension: Yes - Father; Kidney Disease: No; Lung Disease: No; Seizures: No; Stroke: No; Thyroid Problems: No; Tuberculosis: No; Never smoker; Marital Status - Married; Alcohol Use: Rarely; Drug Use: No History; Caffeine Use: Daily; Financial Concerns: No; Food, Clothing or Shelter Needs: No; Support System Lacking: No; Transportation Concerns: No Electronic Signature(s) Signed: 07/11/2022 4:07:33 PM By: [name] MD FACS Entered By: [name] on 07/11/2022 16:04:42 -------------------------------------------------------------------------------- SuperBill Details Patient Name: Date of Service: [name] 07/11/2022 Medical Record Number: [id number] Patient Account Number: [id number] Date of Birth/Sex: Treating RN: 1955/09/23 (67 y.o. M) Primary Care Provider: [name] Other Clinician: Referring Provider: Treating Provider/Extender: [name]: 1 Diagnosis Coding ICD-10 Codes Code Description [phone] Non-pressure chronic ulcer of left ankle with fat layer exposed I73.9 Peripheral vascular disease, unspecified E11.622 Type 2 diabetes  mellitus with other skin ulcer Facility Procedures : SEENA, FACE Code: [id number] Zarian A ([id number]) Description: [phone] - DEBRIDE WOUND 1ST 20 SQ CM OR < ICD-10 Diagnosis Description I73.9 Peripheral vascular disease, unspecified [phone] Modifier: [id number] Quantity: 1 .pdf Page 11 of 11 Physician Procedures : CPT4 Code Description Modifier [id number] - WC PHYS LEVEL 4 - EST PT 25 ICD-10 Diagnosis Description L97.322 Non-pressure chronic ulcer of left ankle with fat layer exposed E11.622 Type 2 diabetes mellitus with other skin ulcer I73.9 Peripheral  vascular disease, unspecified Quantity: 1 : [phone] [id number] - WC PHYS DEBR WO ANESTH 20 SQ CM ICD-10 Diagnosis Description I73.9 Peripheral vascular disease, unspecified Quantity: 1 Electronic Signature(s) Signed: 07/11/2022 4:06:42 PM By: [name] MD FACS Entered By: [name] on 07/11/2022 16:06:42

## 2022-07-11 NOTE — Progress Notes (Signed)
BLAYTON, HUTTNER [name] ([id number]) [id number] Page 1 of 8 Visit Report for 07/11/2022 Arrival Information Details Patient Name: Date of Service: NA [name] 07/11/2022 3:30 PM Medical Record Number: [id number] Patient Account Number: [id number] Date of Birth/Sex: Treating RN: 01-24-1956 (67 y.o. [name] Primary Care Amy Belloso: [name] Other Clinician: Referring Torri Michalski: Treating Matheau Orona/Extender: [name] in Treatment: 1 Visit Information History Since Last Visit Added or deleted any medications: No Patient Arrived: Knee Scooter Any new allergies or adverse reactions: No Arrival Time: 15:33 Had [name] fall or experienced change in No Accompanied By: self activities of daily living that may affect Transfer Assistance: None risk of falls: Patient Identification Verified: Yes Signs or symptoms of abuse/neglect since last visito No Secondary Verification Process Completed: Yes Hospitalized since last visit: No Patient Requires Transmission-Based Precautions: No Implantable device outside of the clinic excluding No Patient Has Alerts: No cellular tissue based products placed in the center since last visit: Has Dressing in Place as Prescribed: Yes Pain Present Now: No Electronic Signature(s) Signed: 07/11/2022 4:46:33 PM By: Samuella Bruin Entered By: Samuella Bruin on 07/11/2022 15:33:43 -------------------------------------------------------------------------------- Encounter Discharge Information Details Patient Name: Date of Service: NA Colin [name]. 07/11/2022 3:30 PM Medical Record Number: [id number] Patient Account Number: [id number] Date of Birth/Sex: Treating RN: 08-24-1955 (68 y.o. [name] Primary Care Shiloh Swopes: [name] Other Clinician: Referring Raelee Rossmann: Treating [name]/Extender: [name] in Treatment: 1 Encounter Discharge Information Items Post  Procedure Vitals Discharge Condition: Stable Temperature (F): 97.8 Ambulatory Status: Knee Scooter Pulse (bpm): 82 Discharge Destination: Home Respiratory Rate (breaths/min): 16 Transportation: Private Auto Blood Pressure (mmHg): 157/82 Accompanied By: self Schedule Follow-up Appointment: Yes Clinical Summary of Care: Patient Declined Electronic Signature(s) Signed: 07/11/2022 4:46:33 PM By: Samuella Bruin Entered By: Samuella Bruin on 07/11/2022 16:00:30 [name] ([id number]) [id number] Page 2 of 8 -------------------------------------------------------------------------------- Lower Extremity Assessment Details Patient Name: Date of Service: [name] 07/11/2022 3:30 PM Medical Record Number: [id number] Patient Account Number: [id number] Date of Birth/Sex: Treating RN: 1956/04/29 (67 y.o. [name] Primary Care [name]: [name] Other Clinician: Referring Meriah Shands: Treating Lyncoln Maskell/Extender: [name]: 1 Edema Assessment Assessed: [Left: No] [Right: No] [Left: Edema] [Right: :] Calf Left: Right: Point of Measurement: From Medial Instep 36 cm Ankle Left: Right: Point of Measurement: From Medial Instep 25 cm Electronic Signature(s) Signed: 07/11/2022 4:46:33 PM By: Samuella Bruin Entered By: Samuella Bruin on 07/11/2022 15:37:56 -------------------------------------------------------------------------------- Multi Wound Chart Details Patient Name: Date of Service: Colin Fanning [name]. 07/11/2022 3:30 PM Medical Record Number: [id number] Patient Account Number: [id number] Date of Birth/Sex: Treating RN: 1956/02/08 (67 y.o. M) Primary Care Paizley Ramella: [name] Other Clinician: Referring Amayia Ciano: Treating [name]/Extender: [name]: 1 Vital Signs Height(in): 74 Capillary Blood Glucose(mg/dl): 80 Weight(lbs):  053 Pulse(bpm): 82 Body Mass Index(BMI): 24 Blood Pressure(mmHg): 157/82 Temperature(F): 97.8 Respiratory Rate(breaths/min): 16 [2:Photos:] [N/[name]:N/[name]] Left, Medial Ankle N/[name]/[name] Wound Location: Pressure Injury N/[name]/[name] Wounding Event: Diabetic Wound/Ulcer of the Lower N/[name]/[name] Primary Etiology: Extremity Cataracts, Coronary Artery Disease, N/[name]/[name] Comorbid History: Hypertension, Myocardial Infarction, [name], [name] ([id number]) [id number] Page 3 of 8 Peripheral Arterial Disease, Type II Diabetes, Osteomyelitis, Neuropathy 06/13/2022 N/[name]/[name] Date Acquired: 1 N/[name]/[name] Weeks of Treatment: Open N/[name]/[name] Wound Status: No N/[name]/[name] Wound Recurrence: 2x1.2x0.1 N/[name]/[name] Measurements L x W x D (cm) 1.885 N/[name]/[name] [name] (  cm) : rea 0.188 N/[name]/[name] Volume (cm) : 7.70% N/[name]/[name] % Reduction in [name] rea: 7.80% N/[name]/[name] % Reduction in Volume: Grade 1 N/[name]/[name] Classification: Medium N/[name]/[name] Exudate [name] mount: Serous N/[name]/[name] Exudate Type: amber N/[name]/[name] Exudate Color: Distinct, outline attached N/[name]/[name] Wound Margin: Large (67-100%) N/[name]/[name] Granulation [name] mount: Small (1-33%) N/[name]/[name] Necrotic [name] mount: Fat Layer (Subcutaneous Tissue): Yes N/[name]/[name] Exposed Structures: Fascia: No Tendon: No Muscle: No Joint: No Bone: No Small (1-33%) N/[name]/[name] Epithelialization: Debridement - Selective/Open Wound N/[name]/[name] Debridement: Pre-procedure Verification/Time Out 15:50 N/[name]/[name] Taken: [name] 5% topical ointment N/[name]/[name] Pain Control: Slough N/[name]/[name] Tissue Debrided: Non-Viable Tissue N/[name]/[name] Level: 2.4 N/[name]/[name] Debridement [name] (sq cm): rea Curette N/[name]/[name] Instrument: Minimum N/[name]/[name] Bleeding: Pressure N/[name]/[name] Hemostasis [name] chieved: Procedure was tolerated well N/[name]/[name] Debridement Treatment Response: 2x1.2x0.1 N/[name]/[name] Post Debridement Measurements L x W x D (cm) 0.188 N/[name]/[name] Post Debridement Volume: (cm) Maceration: Yes N/[name]/[name] Periwound Skin  Moisture: Dry/Scaly: No No Abnormalities Noted N/[name]/[name] Periwound Skin Color: No Abnormality N/[name]/[name] Temperature: Debridement N/[name]/[name] Procedures Performed: Treatment Notes Wound #2 (Ankle) Wound Laterality: Left, Medial Cleanser Normal Saline Discharge Instruction: Cleanse the wound with Normal Saline prior to applying [name] clean dressing using gauze sponges, not tissue or cotton balls. Peri-Wound Care Sween Lotion (Moisturizing lotion) Discharge Instruction: Apply moisturizing lotion as directed Topical Gentamicin Discharge Instruction: As directed by physician Primary Dressing Sorbalgon AG Dressing 2x2 (in/in) Discharge Instruction: Apply to wound bed as instructed Secondary Dressing ABD Pad, 5x9 Discharge Instruction: Apply over primary dressing as directed. Secured With Transpore Surgical Tape, 2x10 (in/yd) Discharge Instruction: Secure dressing with tape as directed. Compression Wrap Kerlix Roll 4.5x3.1 (in/yd) Discharge Instruction: Apply Kerlix and Coban compression as directed. Coban Self-Adherent Wrap 4x5 (in/yd) Discharge Instruction: Apply over Kerlix as directed. Compression Stockings NICOLIS, BOODY [name] ([id number]) [id number] Page 4 of 8 Add-Ons Electronic Signature(s) Signed: 07/11/2022 4:03:55 PM By: [name] MD FACS Entered By: [name] on 07/11/2022 16:03:55 -------------------------------------------------------------------------------- Multi-Disciplinary Care Plan Details Patient Name: Date of Service: NA [name] [name]. 07/11/2022 3:30 PM Medical Record Number: [id number] Patient Account Number: [id number] Date of Birth/Sex: Treating RN: [birth year] (67 y.o. [name] Primary Care Jynesis Nakamura: [name] Other Clinician: Referring Gwynn Crossley: Treating Lessie Funderburke/Extender: [name] Weeks in Treatment: 1 Active Inactive Pressure Nursing Diagnoses: Knowledge deficit related to causes and  risk factors for pressure ulcer development Knowledge deficit related to management of pressures ulcers Goals: Patient will remain free of pressure ulcers Date Initiated: 07/04/2022 Target Resolution Date: 08/11/2022 Goal Status: Active Interventions: Assess: immobility, friction, shearing, incontinence upon admission and as needed Assess potential for pressure ulcer upon admission and as needed Treatment Activities: Patient referred for pressure reduction/relief devices : 07/04/2022 Notes: Wound/Skin Impairment Nursing Diagnoses: Knowledge deficit related to smoking impact on wound healing Goals: Ulcer/skin breakdown will have [name] volume reduction of 30% by week 4 Date Initiated: 07/04/2022 Target Resolution Date: 08/11/2022 Goal Status: Active Interventions: Assess ulceration(s) every visit Provide education on ulcer and skin care Notes: Electronic Signature(s) Signed: 07/11/2022 4:46:33 PM By: Adline Peals Entered By: Adline Peals on 07/11/2022 15:48:44 [name] ([id number]) [id number] Page 5 of 8 -------------------------------------------------------------------------------- Pain Assessment Details Patient Name: Date of Service: [name] 07/11/2022 3:30 PM Medical Record Number: [id number] Patient Account Number: [id number] Date of Birth/Sex: Treating RN: [birth year] (67 y.o. [name] Primary  Care Carmesha Morocco: [name] Other Clinician: Referring Cornisha Zetino: Treating Varick Keys/Extender: [name]: 1 Active Problems Location of Pain Severity and Description of Pain Patient Has Paino No Site Locations Rate the pain. Current Pain Level: 0 Pain Management and Medication Current Pain Management: Electronic Signature(s) Signed: 07/11/2022 4:46:33 PM By: Samuella Bruin Entered By: Samuella Bruin on 07/11/2022  15:33:54 -------------------------------------------------------------------------------- Patient/Caregiver Education Details Patient Name: Date of Service: NA [name] 1/29/2024andnbsp3:30 PM Medical Record Number: [id number] Patient Account Number: [id number] Date of Birth/Gender: Treating RN: 1956/04/02 (67 y.o. [name] Primary Care Physician: [name] Other Clinician: Referring Physician: Treating Physician/Extender: [name] in Treatment: 1 Education Assessment Education Provided To: Patient Education Topics Provided Wound/Skin Impairment: Methods: Explain/Verbal Responses: Reinforcements needed, State content correctly ODELL, CHOUNG [name] ([id number]) [phone].pdf Page 6 of 8 Electronic Signature(s) Signed: 07/11/2022 4:46:33 PM By: Samuella Bruin Entered By: Samuella Bruin on 07/11/2022 16:00:00 -------------------------------------------------------------------------------- Wound Assessment Details Patient Name: Date of Service: Colin Fanning [name]. 07/11/2022 3:30 PM Medical Record Number: [id number] Patient Account Number: [id number] Date of Birth/Sex: Treating RN: January 06, 1956 (67 y.o. [name] Primary Care Rino Hosea: [name] Other Clinician: Referring Kamrin Sibley: Treating Vernee Baines/Extender: [name]: 1 Wound Status Wound Number: 2 Primary Diabetic Wound/Ulcer of the Lower Extremity Etiology: Wound Location: Left, Medial Ankle Wound Open Wounding Event: Pressure Injury Status: Date Acquired: 06/13/2022 Comorbid Cataracts, Coronary Artery Disease, Hypertension, Myocardial Weeks Of Treatment: 1 History: Infarction, Peripheral Arterial Disease, Type II Diabetes, Clustered Wound: No Osteomyelitis, Neuropathy Photos Wound Measurements Length: (cm) 2 Width: (cm) 1.2 Depth: (cm) 0.1 Area: (cm) 1.885 Volume: (cm) 0.188 % Reduction in Area:  7.7% % Reduction in Volume: 7.8% Epithelialization: Small (1-33%) Tunneling: No Undermining: No Wound Description Classification: Grade 1 Wound Margin: Distinct, outline attached Exudate Amount: Medium Exudate Type: Serous Exudate Color: amber Foul Odor After Cleansing: No Slough/Fibrino Yes Wound Bed Granulation Amount: Large (67-100%) Exposed Structure Necrotic Amount: Small (1-33%) Fascia Exposed: No Necrotic Quality: Adherent Slough Fat Layer (Subcutaneous Tissue) Exposed: Yes Tendon Exposed: No Muscle Exposed: No Joint Exposed: No Bone Exposed: No Periwound Skin Texture Texture Color No Abnormalities Noted: No No Abnormalities Noted: Yes Moisture Temperature / Pain No Abnormalities Noted: No Temperature: No Abnormality Saltzman, Pedram [name] ([id number]) [id number] Page 7 of 8 Dry / Scaly: No Maceration: Yes Treatment Notes Wound #2 (Ankle) Wound Laterality: Left, Medial Cleanser Normal Saline Discharge Instruction: Cleanse the wound with Normal Saline prior to applying [name] clean dressing using gauze sponges, not tissue or cotton balls. Peri-Wound Care Sween Lotion (Moisturizing lotion) Discharge Instruction: Apply moisturizing lotion as directed Topical Gentamicin Discharge Instruction: As directed by physician Primary Dressing Sorbalgon AG Dressing 2x2 (in/in) Discharge Instruction: Apply to wound bed as instructed Secondary Dressing ABD Pad, 5x9 Discharge Instruction: Apply over primary dressing as directed. Secured With Transpore Surgical Tape, 2x10 (in/yd) Discharge Instruction: Secure dressing with tape as directed. Compression Wrap Kerlix Roll 4.5x3.1 (in/yd) Discharge Instruction: Apply Kerlix and Coban compression as directed. Coban Self-Adherent Wrap 4x5 (in/yd) Discharge Instruction: Apply over Kerlix as directed. Compression Stockings Add-Ons Electronic Signature(s) Signed: 07/11/2022 4:46:33 PM By: Samuella Bruin Signed: 07/11/2022 4:55:35 PM By: Tommie Ard RN Entered By: Tommie Ard on 07/11/2022 15:40:03 -------------------------------------------------------------------------------- Vitals Details Patient Name: Date of Service: NA [name]. 07/11/2022 3:30 PM Medical Record Number: [id number] Patient Account Number: [id number] Date of Birth/Sex: Treating RN: [birth year] (67 y.o. [name] Primary  Care Dervin Vore: [name] Other Clinician: Referring Shakura Cowing: Treating Mauria Asquith/Extender: [name] Weeks in Treatment: 1 Vital Signs Time Taken: 15:35 Temperature (F): 97.8 Height (in): 74 Pulse (bpm): 82 Weight (lbs): 187 Respiratory Rate (breaths/min): 16 Body Mass Index (BMI): 24 Blood Pressure (mmHg): 157/82 Capillary Blood Glucose (mg/dl): 80 Reference Range: 80 - 120 mg / dl Electronic Signature(s) Signed: 07/11/2022 4:46:33 PM By: Melford Aase, Signed: 07/11/2022 4:46:33 PM By: Karma Greaser ([id number]) [id number] Page 8 of 8 aylor Entered By: Adline Peals on 07/11/2022 15:35:55

## 2022-07-13 ENCOUNTER — Encounter (HOSPITAL_COMMUNITY): Payer: Self-pay | Admitting: Podiatry

## 2022-07-18 ENCOUNTER — Encounter (HOSPITAL_BASED_OUTPATIENT_CLINIC_OR_DEPARTMENT_OTHER): Payer: BC Managed Care – PPO | Attending: General Surgery | Admitting: General Surgery

## 2022-07-18 DIAGNOSIS — L97322 Non-pressure chronic ulcer of left ankle with fat layer exposed: Secondary | ICD-10-CM | POA: Insufficient documentation

## 2022-07-18 DIAGNOSIS — E7800 Pure hypercholesterolemia, unspecified: Secondary | ICD-10-CM | POA: Insufficient documentation

## 2022-07-18 DIAGNOSIS — E1140 Type 2 diabetes mellitus with diabetic neuropathy, unspecified: Secondary | ICD-10-CM | POA: Diagnosis not present

## 2022-07-18 DIAGNOSIS — Z833 Family history of diabetes mellitus: Secondary | ICD-10-CM | POA: Diagnosis not present

## 2022-07-18 DIAGNOSIS — E11622 Type 2 diabetes mellitus with other skin ulcer: Secondary | ICD-10-CM | POA: Diagnosis not present

## 2022-07-18 DIAGNOSIS — I10 Essential (primary) hypertension: Secondary | ICD-10-CM | POA: Insufficient documentation

## 2022-07-18 DIAGNOSIS — Z951 Presence of aortocoronary bypass graft: Secondary | ICD-10-CM | POA: Diagnosis not present

## 2022-07-18 DIAGNOSIS — K219 Gastro-esophageal reflux disease without esophagitis: Secondary | ICD-10-CM | POA: Diagnosis not present

## 2022-07-18 DIAGNOSIS — I2510 Atherosclerotic heart disease of native coronary artery without angina pectoris: Secondary | ICD-10-CM | POA: Insufficient documentation

## 2022-07-18 DIAGNOSIS — E1151 Type 2 diabetes mellitus with diabetic peripheral angiopathy without gangrene: Secondary | ICD-10-CM | POA: Diagnosis not present

## 2022-07-20 NOTE — Progress Notes (Signed)
Colin, [name] ([id number]) [id number] Page 1 of 7 Visit Report for 07/18/2022 Arrival Information Details Patient Name: Date of Service: NA [name] 07/18/2022 2:45 PM Medical Record Number: [id number] Patient Account Number: [id number] Date of Birth/Sex: Treating RN: 07/21/1955 (67 y.o. [name] Primary Care Emmalynne Courtney: [name] Other Clinician: Referring Tuyen Uncapher: Treating Jiyan Walkowski/Extender: [name] in Treatment: 2 Visit Information History Since Last Visit Added or deleted any medications: No Patient Arrived: Knee Scooter Any new allergies or adverse reactions: No Arrival Time: 14:43 Had [name] fall or experienced change in No Accompanied By: self activities of daily living that may affect Transfer Assistance: None risk of falls: Patient Identification Verified: Yes Signs or symptoms of abuse/neglect since last visito No Secondary Verification Process Completed: Yes Hospitalized since last visit: No Patient Requires Transmission-Based Precautions: No Implantable device outside of the clinic excluding No Patient Has Alerts: No cellular tissue based products placed in the center since last visit: Has Dressing in Place as Prescribed: Yes Pain Present Now: No Electronic Signature(s) Signed: 07/20/2022 7:57:59 AM By: Blanche East RN Entered By: Blanche East on 07/18/2022 14:50:51 -------------------------------------------------------------------------------- Encounter Discharge Information Details Patient Name: Date of Service: NA [name]. 07/18/2022 2:45 PM Medical Record Number: [id number] Patient Account Number: [id number] Date of Birth/Sex: Treating RN: 03-Mar-1956 (67 y.o. [name] Primary Care Elyanna Wallick: [name] Other Clinician: Referring Paraskevi Funez: Treating Stanislawa Gaffin/Extender: [name] in Treatment: 2 Encounter Discharge Information Items Post Procedure  Vitals Discharge Condition: Stable Temperature (F): 97.7 Ambulatory Status: Knee Scooter Pulse (bpm): 78 Discharge Destination: Home Respiratory Rate (breaths/min): 18 Transportation: Private Auto Blood Pressure (mmHg): 162/93 Accompanied By: self Schedule Follow-up Appointment: Yes Clinical Summary of Care: Electronic Signature(s) Signed: 07/20/2022 7:57:59 AM By: Blanche East RN Entered By: Blanche East on 07/18/2022 15:15:19 [name] ([id number]) [id number] Page 2 of 7 -------------------------------------------------------------------------------- Lower Extremity Assessment Details Patient Name: Date of Service: [name] 07/18/2022 2:45 PM Medical Record Number: [id number] Patient Account Number: [id number] Date of Birth/Sex: Treating RN: [birth year] (67 y.o. [name] Primary Care Mechille Varghese: [name] Other Clinician: Referring Shahir Karen: Treating Mckaela Howley/Extender: [name] Weeks in Treatment: 2 Edema Assessment Assessed: [Left: No] [Right: No] [Left: Edema] [Right: :] Calf Left: Right: Point of Measurement: From Medial Instep 39 cm Ankle Left: Right: Point of Measurement: From Medial Instep 24.4 cm Vascular Assessment Pulses: Dorsalis Pedis Palpable: [Left:Yes] Electronic Signature(s) Signed: 07/20/2022 7:57:59 AM By: Blanche East RN Entered By: Blanche East on 07/18/2022 14:51:25 -------------------------------------------------------------------------------- Multi Wound Chart Details Patient Name: Date of Service: Colin Loveless [name]. 07/18/2022 2:45 PM Medical Record Number: [id number] Patient Account Number: [id number] Date of Birth/Sex: Treating RN: 09/20/1955 (67 y.o. M) Primary Care Carmelita Amparo: [name] Other Clinician: Referring Nataki Mccrumb: Treating Keyandre Pileggi/Extender: [name] Weeks in Treatment: 2 [2:Photos: No Photos Left, Medial Ankle Wound Location: Pressure  Injury Wounding Event: Diabetic Wound/Ulcer of the Lower Primary Etiology: Extremity Cataracts, Coronary Artery Disease, Comorbid History: Hypertension, Myocardial Infarction, Peripheral Arterial  Disease, Type II Diabetes, Osteomyelitis, Neuropathy 06/13/2022 Date Acquired: 2 Weeks of Treatment: Open Wound Status: No Wound Recurrence: 3.5x1x0.1 Measurements L x W x D (cm) 2.749 [name] (cm) : rea 0.275 Volume (cm) : -34.60% % Reduction in Area: -34.80%  % Reduction in Volume:] [N/[name]:N/[name]/[name]/[name]/[name]/[name]/[name]/[name]/[name]/[name]/[name]/[name]/[name]/[name]/[name]] JASER, FULLEN ([id number]) [2:12 Starting Position 1 (o'clock): 3 Ending  Position 1 (o'clock): 0.3 Maximum Distance 1 (cm): Yes Undermining: Grade 1 Classification: Medium Exudate [name] mount: Serous Exudate Type: amber Exudate Color: Distinct, outline attached Wound Margin: Large  (67-100%) Granulation [name] mount: Small (1-33%) Necrotic [name] mount: Fat Layer (Subcutaneous Tissue): Yes N/[name] Exposed Structures: Fascia: No Tendon: No Muscle: No Joint: No Bone: No Small (1-33%) Epithelialization: Debridement - Excisional Debridement:  Pre-procedure Verification/Time Out 15:01 Taken: Lidocaine 4% Topical Solution Pain Control: Subcutaneous, Slough Tissue Debrided: Skin/Subcutaneous Tissue Level: 3.5 Debridement [name] (sq cm): rea Curette Instrument: Minimum Bleeding: Pressure Hemostasis [name]  chieved: Procedure was tolerated well Debridement Treatment Response: 3.5x1x0.1 Post Debridement Measurements L x W x D (cm) 0.275 Post Debridement Volume: (cm) No Abnormalities Noted Periwound Skin Texture: Maceration: Yes Periwound Skin Moisture:  Dry/Scaly: No No Abnormalities Noted Periwound Skin Color: No Abnormality Temperature: Debridement Procedures Performed:] [N/[name]:N/[name]/[name]/[name]/[name]/[name]/[name]/[name]/[name]/[name]/[name]/[name]/[name]/[name]/[name] .5 N/[name]/[name]/[name]/[name]/[name] .5x1x0.1 N/[name]/[name]/[name]/[name]/[name]/[name]/[name]] Treatment Notes Electronic Signature(s) Signed: 07/18/2022 3:06:29 PM By: [name] MD FACS Entered By: [name] on 07/18/2022 15:06:29 -------------------------------------------------------------------------------- Multi-Disciplinary Care Plan Details Patient Name: Date of Service: NA [name]. 07/18/2022 2:45 PM Medical Record Number: [id number] Patient Account Number: [id number] Date of Birth/Sex: Treating RN: 1955/07/29 (67 y.o. [name] Primary Care Yalanda Soderman: [name] Other Clinician: Referring Hetal Proano: Treating Lillyana Majette/Extender: [name] Weeks in Treatment: 2 Active Inactive Pressure Nursing Diagnoses: Knowledge deficit related to causes and risk factors for pressure ulcer development Knowledge deficit related to management of pressures ulcers Goals: Patient will remain free of pressure ulcers Date Initiated: 07/04/2022 Target Resolution Date: 08/11/2022 Goal Status: Active Interventions: Assess: immobility, friction, shearing, incontinence upon admission and as needed MANJOT, HINKS [name] ([id number]) [id number] Page 4 of 7 Assess potential for pressure ulcer upon admission and as needed Treatment Activities: Patient referred for pressure reduction/relief devices : 07/04/2022 Notes: Wound/Skin Impairment Nursing Diagnoses: Knowledge deficit related to smoking impact on wound healing Goals: Ulcer/skin breakdown will have [name] volume reduction of 30% by week 4 Date Initiated: 07/04/2022 Target Resolution Date: 08/11/2022 Goal Status: Active Interventions: Assess ulceration(s) every visit Provide education on ulcer and skin care Notes: Electronic Signature(s) Signed: 07/20/2022 7:57:59 AM By: Blanche East RN Entered By: Blanche East on 07/18/2022 15:11:52 -------------------------------------------------------------------------------- Pain Assessment Details Patient Name: Date of Service: Colin Loveless [name]. 07/18/2022 2:45 PM Medical Record Number: [id number] Patient Account Number: [id number] Date of Birth/Sex:  Treating RN: 21-Feb-1956 (67 y.o. [name] Primary Care Savannha Welle: [name] Other Clinician: Referring Ayinde Swim: Treating Vasco Chong/Extender: [name] Weeks in Treatment: 2 Active Problems Location of Pain Severity and Description of Pain Patient Has Paino No Site Locations Rate the pain. Current Pain Level: 0 Pain Management and Medication Current Pain Management: Electronic Signature(s) Signed: 07/20/2022 7:57:59 AM By: Blanche East RN Entered By: Blanche East on 07/18/2022 14:51:14 [name] ([id number]) [id number] Page 5 of 7 -------------------------------------------------------------------------------- Patient/Caregiver Education Details Patient Name: Date of Service: NA [name] 2/5/2024andnbsp2:45 PM Medical Record Number: [id number] Patient Account Number: [id number] Date of Birth/Gender: Treating RN: [birth year] (67 y.o. [name] Primary Care Physician: [name] Other Clinician: Referring Physician: Treating Physician/Extender: [name] in Treatment: 2 Education Assessment Education Provided To: Patient Education Topics Provided Wound Debridement: Methods: Explain/Verbal Responses: Reinforcements needed, State content correctly Wound/Skin Impairment: Methods: Explain/Verbal Responses: Reinforcements needed, State content correctly Electronic  Signature(s) Signed: 07/20/2022 7:57:59 AM By: Blanche East RN Entered By: Blanche East on 07/18/2022 15:12:09 -------------------------------------------------------------------------------- Wound Assessment Details Patient Name: Date of Service: Colin Loveless [name]. 07/18/2022 2:45 PM Medical Record Number: [id number] Patient Account Number: [id number] Date of Birth/Sex: Treating RN: 10-15-1955 (67 y.o. [name] Primary Care Tyra Michelle: [name] Other Clinician: Referring Luvina Poirier: Treating Damare Serano/Extender:  [name] Weeks in Treatment: 2 Wound Status Wound Number: 2 Primary Diabetic Wound/Ulcer of the Lower Extremity Etiology: Wound Location: Left, Medial Ankle Wound Open Wounding Event: Pressure Injury Status: Date Acquired: 06/13/2022 Comorbid Cataracts, Coronary Artery Disease, Hypertension, Myocardial Weeks Of Treatment: 2 History: Infarction, Peripheral Arterial Disease, Type II Diabetes, Clustered Wound: No Osteomyelitis, Neuropathy Wound Measurements Length: (cm) 3.5 Width: (cm) 1 Depth: (cm) 0.1 Area: (cm) 2.749 Volume: (cm) 0.275 % Reduction in Area: -34.6% % Reduction in Volume: -34.8% Epithelialization: Small (1-33%) Tunneling: No Undermining: Yes Starting Position (o'clock): 12 Ending Position (o'clock): 3 Maximum Distance: (cm) 0.3 Wound Description [name], [name] ([id number]) Classification: Grade 1 Wound Margin: Distinct, outline attached Exudate Amount: Medium Exudate Type: Serous Exudate Color: amber [id number] Page 6 of 7 Foul Odor After Cleansing: No Slough/Fibrino Yes Wound Bed Granulation Amount: Large (67-100%) Exposed Structure Necrotic Amount: Small (1-33%) Fascia Exposed: No Necrotic Quality: Adherent Slough Fat Layer (Subcutaneous Tissue) Exposed: Yes Tendon Exposed: No Muscle Exposed: No Joint Exposed: No Bone Exposed: No Periwound Skin Texture Texture Color No Abnormalities Noted: Yes No Abnormalities Noted: Yes Moisture Temperature / Pain No Abnormalities Noted: No Temperature: No Abnormality Dry / Scaly: No Maceration: Yes Treatment Notes Wound #2 (Ankle) Wound Laterality: Left, Medial Cleanser Normal Saline Discharge Instruction: Cleanse the wound with Normal Saline prior to applying [name] clean dressing using gauze sponges, not tissue or cotton balls. Peri-Wound Care Sween Lotion (Moisturizing lotion) Discharge Instruction: Apply moisturizing lotion as directed Topical Mupirocin  Ointment Discharge Instruction: Apply Mupirocin (Bactroban) as instructed Primary Dressing Sorbalgon AG Dressing 2x2 (in/in) Discharge Instruction: Apply to wound bed as instructed Secondary Dressing ABD Pad, 5x9 Discharge Instruction: Apply over primary dressing as directed. Secured With Transpore Surgical Tape, 2x10 (in/yd) Discharge Instruction: Secure dressing with tape as directed. Compression Wrap Kerlix Roll 4.5x3.1 (in/yd) Discharge Instruction: Apply Kerlix and Coban compression as directed. Coban Self-Adherent Wrap 4x5 (in/yd) Discharge Instruction: Apply over Kerlix as directed. Compression Stockings Add-Ons Electronic Signature(s) Signed: 07/20/2022 7:57:59 AM By: Blanche East RN Entered By: Blanche East on 07/18/2022 14:53:02 [name] ([id number]) [id number] Page 7 of 7 -------------------------------------------------------------------------------- Vitals Details Patient Name: Date of Service: NA [name] 07/18/2022 2:45 PM Medical Record Number: [id number] Patient Account Number: [id number] Date of Birth/Sex: Treating RN: 24-Jan-1956 (67 y.o. [name] Primary Care Detron Carras: [name] Other Clinician: Referring Maleko Greulich: Treating Momin Misko/Extender: [name] Weeks in Treatment: 2 Vital Signs Time Taken: 14:50 Reference Range: 80 - 120 mg / dl Height (in): 74 Weight (lbs): 187 Body Mass Index (BMI): 24 Electronic Signature(s) Signed: 07/20/2022 7:57:59 AM By: Blanche East RN Entered By: Blanche East on 07/18/2022 14:51:08

## 2022-07-20 NOTE — Progress Notes (Signed)
Colin, TINDOL [name] ([id number]) [id number] Page 1 of 11 Visit Report for 07/18/2022 Chief Complaint Document Details Patient Name: Date of Service: NA [name] 07/18/2022 2:45 PM Medical Record Number: [id number] Patient Account Number: [id number] Date of Birth/Sex: Treating RN: 08/29/1955 (67 y.o. M) Primary Care Provider: [name] Other Clinician: Referring Provider: Treating Provider/Extender: [name] Weeks in Treatment: 2 Information Obtained from: Patient Chief Complaint Patients presents for treatment of an open diabetic ulcer with exposed bone and osteomyelitis 07/04/2022: Diabetic ankle ulcer Electronic Signature(s) Signed: 07/18/2022 3:06:36 PM By: [name] MD FACS Entered By: [name] on 07/18/2022 15:06:36 -------------------------------------------------------------------------------- Debridement Details Patient Name: Date of Service: NA Colin Philips [name]. 07/18/2022 2:45 PM Medical Record Number: [id number] Patient Account Number: [id number] Date of Birth/Sex: Treating RN: 1956/04/22 (67 y.o. [name] Primary Care Provider: [name] Other Clinician: Referring Provider: Treating Provider/Extender: [name] Weeks in Treatment: 2 Debridement Performed for Assessment: Wound #2 Left,Medial Ankle Performed By: Physician [name], MD Debridement Type: Debridement Severity of Tissue Pre Debridement: Fat layer exposed Level of Consciousness (Pre-procedure): Awake and Alert Pre-procedure Verification/Time Out Yes - 15:01 Taken: Start Time: 15:02 Pain Control: Lidocaine 4% T opical Solution T Area Debrided (L x W): otal 3.5 (cm) x 1 (cm) = 3.5 (cm) Tissue and other material debrided: Viable, Non-Viable, Slough, Subcutaneous, Slough Level: Skin/Subcutaneous Tissue Debridement Description: Excisional Instrument: Curette Bleeding: Minimum Hemostasis Achieved:  Pressure Response to Treatment: Procedure was tolerated well Level of Consciousness (Post- Awake and Alert procedure): Post Debridement Measurements of Total Wound Length: (cm) 3.5 Width: (cm) 1 Depth: (cm) 0.1 Volume: (cm) 0.275 Character of Wound/Ulcer Post Debridement: Requires Further Debridement Severity of Tissue Post Debridement: Fat layer exposed [name], Trevonte [name] ([id number]) [phone].pdf Page 2 of 11 Post Procedure Diagnosis Same as Pre-procedure Notes Scribed for DR. Celine Ahr by Blanche East, RN Electronic Signature(s) Signed: 07/18/2022 3:18:38 PM By: [name] MD FACS Signed: 07/20/2022 7:57:59 AM By: Blanche East RN Entered By: Blanche East on 07/18/2022 15:05:02 -------------------------------------------------------------------------------- HPI Details Patient Name: Date of Service: NA [name]. 07/18/2022 2:45 PM Medical Record Number: [id number] Patient Account Number: [id number] Date of Birth/Sex: Treating RN: Oct 15, 1955 (67 y.o. M) Primary Care Provider: [name] Other Clinician: Referring Provider: Treating Provider/Extender: [name] Weeks in Treatment: 2 History of Present Illness HPI Description: ADMISSION 08/25/2021 This is [name] 67 year old man who initially presented to his primary care provider in September 2022 with pain in his left foot. He was sent for an x-ray and while the x-ray was being performed, the tech pointed out [name] wound on his foot that the patient was not aware existed. He does have type 2 diabetes with significant neuropathy. His diabetes is suboptimally controlled with his most recent A1c being 8.5. He also has [name] history of coronary artery disease status post three- vessel CABG. he was initially seen by orthopedics, but they referred him to [facility]. He has undergone at least 7 operations/debridements and several applications of skin substitute under the care of  podiatry. He has been in [name] for much of this time. His most recent procedure was July 28, 2021. [name] was biopsied and was found to be consistent with osteomyelitis. Culture also returned positive for corynebacterium. He was seen on August 16, 2021 by infectious disease. [name] PICC line has been placed and he will be receiving [name] 6-week course of IV  daptomycin and cefepime. In October 2022, he underwent lower extremity vascular studies. Results are copied here: Right: Resting right ankle-brachial index is within normal range. No evidence of significant right lower extremity arterial disease. The right toe-brachial index is abnormal. Left: Resting left ankle-brachial index indicates mild left lower extremity arterial disease. The left toe-brachial index is abnormal. He has not been seen by vascular surgery despite these findings. He presented to clinic today in [name] and is using [name] to offload. Wound VAC was in place. Once this was removed, [name] large ulcer was identified on the left midfoot/ankle. Bone is frankly exposed. There is no malodorous or purulent drainage. There is some granulation tissue over the central portion of the exposed bone. There is [name] tunnel that extends posteriorly for roughly 10 cm. It has been discussed with him by multiple providers that he is at very high risk of losing his lower leg because of this wound. He is extremely eager to avoid this outcome and is here today to review his options as well as receive ongoing wound care. 09/03/2021: Here for reevaluation of his wound. There does not appear to have been any substantial improvement overall since our last visit. He has been in [name] with white foam overlying the exposed bone. We are working on getting him approved for hyperbaric oxygen therapy. 09/10/2021: We are in the process of getting him cleared to begin hyperbaric oxygen therapy. He still needs to obtain [name] chest x-ray.  Although the wound measurements are roughly the same, I think the overall appearance of the wound is better. The exposed bone has [name] bit more granulation tissue covering it. He has not received [name] vascular surgery appointment to reevaluate his flow to the wound. 09/17/2021: He has been approved for hyperbaric oxygen therapy and completed his chest x-ray, which I reviewed and it appears normal. The tunnels at the 12 and 10:00 positions are smaller. There is more granulation tissue covering the exposed bone and the undermining has decreased. He still has not received [name] vascular surgery appointment. 09/24/2021: He initiated hyperbaric oxygen therapy this week and is tolerating it well. He has an appointment with vascular surgery coming up on May 16. The granulation tissue is covering more of the exposed bone and both tunnels are [name] bit smaller. 10/01/2021: He continues to tolerate hyperbaric oxygen therapy. He saw infectious disease and they are planning to pull his PICC line. He has been initiated on oral antibiotics (doxycycline and Augmentin). The wound looks about the same but the tunnels are [name] little bit smaller. The skin seems to be contracting somewhat around the exposed bone. 10/08/2021: The wound is still about the same size, but the tunnels continue to come in and the skin is contracting around the exposed bone. He continues to have some accumulation of necrotic material in the inferoposterior aspect of the wound as well as accumulation at the 12:00 tunnel area. 10/15/2021: The wound is smaller today. The tunnels continue to come in. There is less necrotic tissue present. He does have some periwound maceration. 10/22/2021: The wound is about the same size. There is [name] little bit less undermining at the distal portion. The exposed bone is dark and I am not sure if this is staining from silver nitrate or his VAC sponge or if it represents necrosis. The tunnels are shallower but he does have some serous  drainage coming from the [facility] [name] ([id number]) [phone].pdf Page 3 of 11 10:00 tunnel. He  continues to tolerate hyperbaric oxygen therapy well. 10/29/2021: The undermining continues to improve. The tunnels are about the same. He has good granulation tissue overlying the majority of the exposed bone. It does appear that perhaps the tubing from his wound VAC has been eroding the skin at the 12 clock position. He continues to accumulate senescent epithelium around the borders of the wound. 11/05/2021: The undermining is almost completely resolved. The tunnels have contracted fairly significantly. No significant slough or debris accumulation. There is still senescent epithelium accumulation around the borders of the wound. He has been tolerating hyperbaric oxygen therapy well. 11/12/2021: Despite the measurements of the wound being about the same, the wound has changed in its shape and overall, I think it is improved. The undermining has resolved and the tunnels continue to shorten. There is good granulation tissue encroaching over the small area of bone that has remained exposed at the 12 o'clock position. Minimal slough accumulation. He continues to tolerate hyperbaric oxygen therapy well. 11/19/2021: I took [name] PCR culture last week. There was overgrowth of yeast. He is already taking suppressive doxycycline and Augmentin. I added fluconazole to his regimen. The wound is smaller again today. The tunnels continue to shorten. He continues to do well with hyperbaric oxygen therapy. 11/26/2021: For some reason, his foot has become macerated. The wound is narrower but about the same dimensions in its longitudinal aspect. The tunnels continue to shorten. He has some slough buildup on the wound as well as some heaped up senescent epithelium around the perimeter. 12/03/2021: No further maceration of his foot has occurred. The wound has contracted quite significantly from last week. The  tunnel at 10:00 is closed. The tunnel at 12:00 is down to just [name] couple of millimeters. No other undermining is present. There is soft tissue coverage of the previously exposed bone. There is just [name] bit of slough and biofilm on the wound surface. 12/10/2021: The wound is looking good. It turns out the tunnel at 12:00 is only exposed when the patient dorsiflexes his foot. It is about 2 cm in depth when he does this; when his foot is in plantarflexion, the tunnel is closed. The bone that was visible at the 12:00 tunnel is completely covered with granulation tissue, but there does feel like some exposed bone deeper into the tunnel area. There is senescent skin heaped up around the periphery. Minimal slough on the wound surface. 12/16/2021: The wound dimensions are roughly the same. The surface has nice granulation tissue. The exposed bone at the 12:00 tunnel continues to be covered with more soft tissue. 7/14; patient's wound measures smaller today. Using the wound VAC with underlying collagen. He is also being treated with HBO for underlying osteomyelitis. He tells me he is on doxycycline and ampicillin follows with infectious disease next week 12/31/2021: The wound continues to contract. Unfortunately, the area where the track pad and tubing have been rubbing continues to look like it is applying friction. He says that the home health nurses that have been applying the [facility] have been putting gauze underneath the tubing, but nonetheless there is ongoing tissue breakdown at this site. Light slough accumulation on the wound surface. The tunnel continues to contract. He is tolerating HBO without difficulty. 01/07/2022: Bridging the wound VAC away from the ankle has resulted in significant improvement in the tissue at the apex of the wound. The tunnel is still present and is not all that much shorter, but the overall wound surface is very robust and healthy  looking. Minimal slough accumulation. No concern for  acute infection. 01/21/2022: The wound continues to contract and has [name] robust granulation tissue surface. The tunnel has come in considerably and is down to about 1.4 cm. There is still bone exposed within the tunnel but the rest of it is well covered. There is some senescent epithelium at the wound margins and [name] little bit of slough on the surface. 01/28/2022: No significant change in the wound this week, but there has not been any reaccumulation of senescent epithelium or slough. The tunnel is perhaps [name] millimeter less in depth. He has been approved for Apligraf and we will apply this today. 02/04/2022: The wound has contracted somewhat and the tunnel has filled in completely. The wound surface is clean. He is here for Apligraf #2. 02/11/2022: The wound has contracted further and is now nearly flush with the surrounding skin surface. Light layer of slough. Apligraf #3 plan for today. 02/18/2022: There is [name] band of epithelium trying to cut across the superior portion of the wound. There is robust granulation tissue with just [name] light layer of slough and biofilm on the surface. There was [name] little bit of greenish drainage in the wound VAC but none appreciated on the site itself. He is here for Apligraf #4. 03/04/2022: The wound has contracted considerably. There is good granulation tissue on the surface. Minimal biofilm. He is here for Apligraf #5. 03/18/2022: The wound has epithelialized to the point that it has been divided into 2 areas. Both areas are smaller in total than at his previous visit. There is good granulation tissue present with just [name] little bit of periwound eschar and surface slough. 10/13; left medial foot. The patient has completed treatment with hyperbaric oxygen for underlying osteomyelitis he has had [name] nice response in the wound. Noted today that he still had some greenish drainage. Previously he has been treated with topical gentamicin but apparently that was stopped 2 weeks  ago. Fortunately the wound is measuring smaller 10/20; wound looks better no hypergranulation minimal drainage. Granulation tissue looks healthy using gentamicin Hydrofera Blue under compression 04/08/2022: The wound continues to contract tremendously. The response to his treatment for hypertrophic granulation tissue has been quite remarkable. There are just [name] couple of open areas remaining. 04/18/2022: His wound is healed. READMISSION 07/04/2022: He returns to clinic with [name] new ulcer adjacent to where his previous large wound had been. He says that he was wearing boots and using [name] small padded dressing to protect the freshly healed ankle wound. Apparently the dressing rolled up and rubbed on his skin, causing [name] new wound. He contacted our office last week concerned about the appearance. There is [name] small oval wound on his left medial ankle. It is fairly clean with some periwound dry skin and eschar. 07/11/2022: The wound is about the same size this week. There is [name] layer of rubbery slough on the surface. It has [name] fairly strong odor. 07/18/2022: The wound measured larger this week due to small satellite areas opening in [name] fan pattern distal to the primary wound. He continues to accumulate slough on the wound surface. The culture that I took last week only grew out low levels of skin flora. Electronic Signature(s) Signed: 07/18/2022 3:07:19 PM By: [name] MD FACS Entered By: [name] on 07/18/2022 15:07:19 [name] ([id number] Page 4 of 11 -------------------------------------------------------------------------------- Physical Exam Details Patient Name: Date of Service: NA Colin Philips [name]. 07/18/2022 2:45 PM Medical Record Number: [id number]  Patient Account Number: [id number] Date of Birth/Sex: Treating RN: Jun 27, 1955 (67 y.o. M) Primary Care Provider: [name] Other Clinician: Referring Provider: Treating Provider/Extender: [name] Weeks in Treatment: 2 Constitutional no acute distress. Respiratory Normal work of breathing on room air. Notes 07/18/2022: The wound measured larger this week due to small satellite areas opening in [name] fan pattern distal to the primary wound. He continues to accumulate slough on the wound surface. Electronic Signature(s) Signed: 07/18/2022 3:07:44 PM By: [name] MD FACS Entered By: [name] on 07/18/2022 15:07:44 -------------------------------------------------------------------------------- Physician Orders Details Patient Name: Date of Service: NA Colin Philips [name]. 07/18/2022 2:45 PM Medical Record Number: [id number] Patient Account Number: [id number] Date of Birth/Sex: Treating RN: 09/28/1955 (67 y.o. [name] Primary Care Provider: [name] Other Clinician: Referring Provider: Treating Provider/Extender: [name] in Treatment: 2 Verbal / Phone Orders: No Diagnosis Coding ICD-10 Coding Code Description [phone] Non-pressure chronic ulcer of left ankle with fat layer exposed I73.9 Peripheral vascular disease, unspecified E11.622 Type 2 diabetes mellitus with other skin ulcer Follow-up Appointments ppointment in 1 week. - Dr. Celine Ahr - room 2 - Return [name] Anesthetic Wound #2 Left,Medial Ankle (In clinic) Topical Lidocaine 5% applied to wound bed - prior to debridement Bathing/ Shower/ Hygiene May shower and wash wound with soap and water. Edema Control - Lymphedema / SCD / Other Elevate legs to the level of the heart or above for 30 minutes daily and/or when sitting for 3-4 times [name] day throughout the day. Avoid standing for long periods of time. Wound Treatment Wound #2 - Ankle Wound Laterality: Left, Medial Cleanser: Normal Saline 1 x Per Day/30 Days Discharge Instructions: Cleanse the wound with Normal Saline prior to applying [name] clean dressing using gauze sponges, not tissue or cotton balls. CHELSEY, KIMBERLEY  [name] ([id number]) [id number] Page 5 of 11 Peri-Wound Care: Sween Lotion (Moisturizing lotion) 1 x Per Day/30 Days Discharge Instructions: Apply moisturizing lotion as directed Topical: Mupirocin Ointment 1 x Per Day/30 Days Discharge Instructions: Apply Mupirocin (Bactroban) as instructed Prim Dressing: [place] 2x2 (in/in) (Dispense As Written) 1 x Per Day/30 Days ary Discharge Instructions: Apply to wound bed as instructed Secondary Dressing: ABD Pad, 5x9 (Generic) 1 x Per Day/30 Days Discharge Instructions: Apply over primary dressing as directed. Secured With: Transpore Surgical Tape, 2x10 (in/yd) 1 x Per Day/30 Days Discharge Instructions: Secure dressing with tape as directed. Compression Wrap: Kerlix Roll 4.5x3.1 (in/yd) (Generic) 1 x Per Day/30 Days Discharge Instructions: Apply Kerlix and Coban compression as directed. Compression Wrap: Coban Self-Adherent Wrap 4x5 (in/yd) (Generic) 1 x Per Day/30 Days Discharge Instructions: Apply over Kerlix as directed. Patient Medications llergies: codeine [name] Notifications Medication Indication Start End 07/18/2022 mupirocin DOSE topical 2 % ointment - Apply to wound with dressing changes Electronic Signature(s) Signed: 07/18/2022 3:18:38 PM By: [name] MD FACS Previous Signature: 07/18/2022 3:08:40 PM Version By: [name] MD FACS Entered By: [name] on 07/18/2022 15:08:54 -------------------------------------------------------------------------------- Problem List Details Patient Name: Date of Service: NA Colin Philips [name]. 07/18/2022 2:45 PM Medical Record Number: [id number] Patient Account Number: [id number] Date of Birth/Sex: Treating RN: 1956/01/23 (67 y.o. M) Primary Care Provider: [name] Other Clinician: Referring Provider: Treating Provider/Extender: [name] Weeks in Treatment: 2 Active Problems ICD-10 Encounter Code Description Active Date  MDM Diagnosis [phone] Non-pressure chronic ulcer of left ankle with fat layer exposed 07/04/2022 No Yes I73.9 Peripheral vascular disease, unspecified 07/04/2022 No Yes E11.622  Type 2 diabetes mellitus with other skin ulcer 07/04/2022 No Yes Inactive Problems JUNG, YURCHAK [name] ([id number]) [phone].pdf Page 6 of 11 Resolved Problems Electronic Signature(s) Signed: 07/18/2022 3:06:23 PM By: [name] MD FACS Entered By: [name] on 07/18/2022 15:06:23 -------------------------------------------------------------------------------- Progress Note Details Patient Name: Date of Service: NA Colin Philips [name]. 07/18/2022 2:45 PM Medical Record Number: [id number] Patient Account Number: [id number] Date of Birth/Sex: Treating RN: 22-Dec-1955 (67 y.o. M) Primary Care Provider: [name] Other Clinician: Referring Provider: Treating Provider/Extender: [name] Weeks in Treatment: 2 Subjective Chief Complaint Information obtained from Patient Patients presents for treatment of an open diabetic ulcer with exposed bone and osteomyelitis 07/04/2022: Diabetic ankle ulcer History of Present Illness (HPI) ADMISSION 08/25/2021 This is [name] 67 year old man who initially presented to his primary care provider in September 2022 with pain in his left foot. He was sent for an x-ray and while the x-ray was being performed, the tech pointed out [name] wound on his foot that the patient was not aware existed. He does have type 2 diabetes with significant neuropathy. His diabetes is suboptimally controlled with his most recent A1c being 8.5. He also has [name] history of coronary artery disease status post three- vessel CABG. he was initially seen by orthopedics, but they referred him to [facility]. He has undergone at least 7 operations/debridements and several applications of skin substitute under the care of podiatry. He has been in [name] for much  of this time. His most recent procedure was July 28, 2021. [name] was biopsied and was found to be consistent with osteomyelitis. Culture also returned positive for corynebacterium. He was seen on August 16, 2021 by infectious disease. [name] PICC line has been placed and he will be receiving [name] 6-week course of IV daptomycin and cefepime. In October 2022, he underwent lower extremity vascular studies. Results are copied here: Right: Resting right ankle-brachial index is within normal range. No evidence of significant right lower extremity arterial disease. The right toe-brachial index is abnormal. Left: Resting left ankle-brachial index indicates mild left lower extremity arterial disease. The left toe-brachial index is abnormal. He has not been seen by vascular surgery despite these findings. He presented to clinic today in [name] and is using [name] to offload. Wound VAC was in place. Once this was removed, [name] large ulcer was identified on the left midfoot/ankle. Bone is frankly exposed. There is no malodorous or purulent drainage. There is some granulation tissue over the central portion of the exposed bone. There is [name] tunnel that extends posteriorly for roughly 10 cm. It has been discussed with him by multiple providers that he is at very high risk of losing his lower leg because of this wound. He is extremely eager to avoid this outcome and is here today to review his options as well as receive ongoing wound care. 09/03/2021: Here for reevaluation of his wound. There does not appear to have been any substantial improvement overall since our last visit. He has been in [name] with white foam overlying the exposed bone. We are working on getting him approved for hyperbaric oxygen therapy. 09/10/2021: We are in the process of getting him cleared to begin hyperbaric oxygen therapy. He still needs to obtain [name] chest x-ray. Although the wound measurements are roughly the  same, I think the overall appearance of the wound is better. The exposed bone has [name] bit more granulation  tissue covering it. He has not received [name] vascular surgery appointment to reevaluate his flow to the wound. 09/17/2021: He has been approved for hyperbaric oxygen therapy and completed his chest x-ray, which I reviewed and it appears normal. The tunnels at the 12 and 10:00 positions are smaller. There is more granulation tissue covering the exposed bone and the undermining has decreased. He still has not received [name] vascular surgery appointment. 09/24/2021: He initiated hyperbaric oxygen therapy this week and is tolerating it well. He has an appointment with vascular surgery coming up on May 16. The granulation tissue is covering more of the exposed bone and both tunnels are [name] bit smaller. 10/01/2021: He continues to tolerate hyperbaric oxygen therapy. He saw infectious disease and they are planning to pull his PICC line. He has been initiated on oral antibiotics (doxycycline and Augmentin). The wound looks about the same but the tunnels are [name] little bit smaller. The skin seems to be contracting somewhat around the exposed bone. 10/08/2021: The wound is still about the same size, but the tunnels continue to come in and the skin is contracting around the exposed bone. He continues to have some accumulation of necrotic material in the inferoposterior aspect of the wound as well as accumulation at the 12:00 tunnel area. 10/15/2021: The wound is smaller today. The tunnels continue to come in. There is less necrotic tissue present. He does have some periwound maceration. 10/22/2021: The wound is about the same size. There is [name] little bit less undermining at the distal portion. The exposed bone is dark and I am not sure if this is staining from silver nitrate or his VAC sponge or if it represents necrosis. The tunnels are shallower but he does have some serous drainage coming from the 10:00 tunnel. He continues  to tolerate hyperbaric oxygen therapy well. GLYNN, NGAI [name] ([id number]) [id number] Page 7 of 11 10/29/2021: The undermining continues to improve. The tunnels are about the same. He has good granulation tissue overlying the majority of the exposed bone. It does appear that perhaps the tubing from his wound VAC has been eroding the skin at the 12 clock position. He continues to accumulate senescent epithelium around the borders of the wound. 11/05/2021: The undermining is almost completely resolved. The tunnels have contracted fairly significantly. No significant slough or debris accumulation. There is still senescent epithelium accumulation around the borders of the wound. He has been tolerating hyperbaric oxygen therapy well. 11/12/2021: Despite the measurements of the wound being about the same, the wound has changed in its shape and overall, I think it is improved. The undermining has resolved and the tunnels continue to shorten. There is good granulation tissue encroaching over the small area of bone that has remained exposed at the 12 o'clock position. Minimal slough accumulation. He continues to tolerate hyperbaric oxygen therapy well. 11/19/2021: I took [name] PCR culture last week. There was overgrowth of yeast. He is already taking suppressive doxycycline and Augmentin. I added fluconazole to his regimen. The wound is smaller again today. The tunnels continue to shorten. He continues to do well with hyperbaric oxygen therapy. 11/26/2021: For some reason, his foot has become macerated. The wound is narrower but about the same dimensions in its longitudinal aspect. The tunnels continue to shorten. He has some slough buildup on the wound as well as some heaped up senescent epithelium around the perimeter. 12/03/2021: No further maceration of his foot has occurred. The wound has contracted quite significantly from last week. The tunnel at  10:00 is closed. The tunnel at 12:00 is  down to just [name] couple of millimeters. No other undermining is present. There is soft tissue coverage of the previously exposed bone. There is just [name] bit of slough and biofilm on the wound surface. 12/10/2021: The wound is looking good. It turns out the tunnel at 12:00 is only exposed when the patient dorsiflexes his foot. It is about 2 cm in depth when he does this; when his foot is in plantarflexion, the tunnel is closed. The bone that was visible at the 12:00 tunnel is completely covered with granulation tissue, but there does feel like some exposed bone deeper into the tunnel area. There is senescent skin heaped up around the periphery. Minimal slough on the wound surface. 12/16/2021: The wound dimensions are roughly the same. The surface has nice granulation tissue. The exposed bone at the 12:00 tunnel continues to be covered with more soft tissue. 7/14; patient's wound measures smaller today. Using the wound VAC with underlying collagen. He is also being treated with HBO for underlying osteomyelitis. He tells me he is on doxycycline and ampicillin follows with infectious disease next week 12/31/2021: The wound continues to contract. Unfortunately, the area where the track pad and tubing have been rubbing continues to look like it is applying friction. He says that the home health nurses that have been applying the [facility] have been putting gauze underneath the tubing, but nonetheless there is ongoing tissue breakdown at this site. Light slough accumulation on the wound surface. The tunnel continues to contract. He is tolerating HBO without difficulty. 01/07/2022: Bridging the wound VAC away from the ankle has resulted in significant improvement in the tissue at the apex of the wound. The tunnel is still present and is not all that much shorter, but the overall wound surface is very robust and healthy looking. Minimal slough accumulation. No concern for acute infection. 01/21/2022: The wound continues to  contract and has [name] robust granulation tissue surface. The tunnel has come in considerably and is down to about 1.4 cm. There is still bone exposed within the tunnel but the rest of it is well covered. There is some senescent epithelium at the wound margins and [name] little bit of slough on the surface. 01/28/2022: No significant change in the wound this week, but there has not been any reaccumulation of senescent epithelium or slough. The tunnel is perhaps [name] millimeter less in depth. He has been approved for Apligraf and we will apply this today. 02/04/2022: The wound has contracted somewhat and the tunnel has filled in completely. The wound surface is clean. He is here for Apligraf #2. 02/11/2022: The wound has contracted further and is now nearly flush with the surrounding skin surface. Light layer of slough. Apligraf #3 plan for today. 02/18/2022: There is [name] band of epithelium trying to cut across the superior portion of the wound. There is robust granulation tissue with just [name] light layer of slough and biofilm on the surface. There was [name] little bit of greenish drainage in the wound VAC but none appreciated on the site itself. He is here for Apligraf #4. 03/04/2022: The wound has contracted considerably. There is good granulation tissue on the surface. Minimal biofilm. He is here for Apligraf #5. 03/18/2022: The wound has epithelialized to the point that it has been divided into 2 areas. Both areas are smaller in total than at his previous visit. There is good granulation tissue present with just [name] little bit of periwound eschar and  surface slough. 10/13; left medial foot. The patient has completed treatment with hyperbaric oxygen for underlying osteomyelitis he has had [name] nice response in the wound. Noted today that he still had some greenish drainage. Previously he has been treated with topical gentamicin but apparently that was stopped 2 weeks ago. Fortunately the wound is measuring smaller 10/20; wound  looks better no hypergranulation minimal drainage. Granulation tissue looks healthy using gentamicin Hydrofera Blue under compression 04/08/2022: The wound continues to contract tremendously. The response to his treatment for hypertrophic granulation tissue has been quite remarkable. There are just [name] couple of open areas remaining. 04/18/2022: His wound is healed. READMISSION 07/04/2022: He returns to clinic with [name] new ulcer adjacent to where his previous large wound had been. He says that he was wearing boots and using [name] small padded dressing to protect the freshly healed ankle wound. Apparently the dressing rolled up and rubbed on his skin, causing [name] new wound. He contacted our office last week concerned about the appearance. There is [name] small oval wound on his left medial ankle. It is fairly clean with some periwound dry skin and eschar. 07/11/2022: The wound is about the same size this week. There is [name] layer of rubbery slough on the surface. It has [name] fairly strong odor. 07/18/2022: The wound measured larger this week due to small satellite areas opening in [name] fan pattern distal to the primary wound. He continues to accumulate slough on the wound surface. The culture that I took last week only grew out low levels of skin flora. Patient History Information obtained from Patient. Family History Cancer - Father, Diabetes - Father,Mother,Paternal Grandparents, Heart Disease - Father, Hypertension - Father, No family history of Hereditary Spherocytosis, Kidney Disease, Lung Disease, Seizures, Stroke, Thyroid Problems, Tuberculosis. Social History Never smoker, Marital Status - Married, Alcohol Use - Rarely, Drug Use - No History, Caffeine Use - Daily. DELLA, HOMAN [name] ([id number]) [id number] Page 8 of 11 Medical History Eyes Patient has history of Cataracts - Removed 2008 Cardiovascular Patient has history of Coronary Artery Disease, Hypertension, Myocardial Infarction,  Peripheral Arterial Disease Endocrine Patient has history of Type II Diabetes Musculoskeletal Patient has history of Osteomyelitis Neurologic Patient has history of Neuropathy Medical [name] Surgical History Notes nd Cardiovascular Hypercholesterolemia Abnormal EKG CABG X3 2019 Gastrointestinal GERD Musculoskeletal Diabetic foot ulcer Objective Constitutional no acute distress. Vitals Time Taken: 2:50 PM, Height: 74 in, Weight: 187 lbs, BMI: 24. Respiratory Normal work of breathing on room air. General Notes: 07/18/2022: The wound measured larger this week due to small satellite areas opening in [name] fan pattern distal to the primary wound. He continues to accumulate slough on the wound surface. Integumentary (Hair, Skin) Wound #2 status is Open. Original cause of wound was Pressure Injury. The date acquired was: 06/13/2022. The wound has been in treatment 2 weeks. The wound is located on the Left,Medial Ankle. The wound measures 3.5cm length x 1cm width x 0.1cm depth; 2.749cm^2 area and 0.275cm^3 volume. There is Fat Layer (Subcutaneous Tissue) exposed. There is no tunneling noted, however, there is undermining starting at 12:00 and ending at 3:00 with [name] maximum distance of 0.3cm. There is [name] medium amount of serous drainage noted. The wound margin is distinct with the outline attached to the wound base. There is large (67-100%) granulation within the wound bed. There is [name] small (1-33%) amount of necrotic tissue within the wound bed including Adherent Slough. The periwound skin appearance had no abnormalities noted for texture. The periwound skin appearance  had no abnormalities noted for color. The periwound skin appearance exhibited: Maceration. The periwound skin appearance did not exhibit: Dry/Scaly. Periwound temperature was noted as No Abnormality. Assessment Active Problems ICD-10 Non-pressure chronic ulcer of left ankle with fat layer exposed Peripheral vascular disease,  unspecified Type 2 diabetes mellitus with other skin ulcer Procedures Wound #2 Pre-procedure diagnosis of Wound #2 is [name] Diabetic Wound/Ulcer of the Lower Extremity located on the Left,Medial Ankle .Severity of Tissue Pre Debridement is: Fat layer exposed. There was [name] Excisional Skin/Subcutaneous Tissue Debridement with [name] total area of 3.5 sq cm performed by Duanne Guessannon, Lamira Borin, MD. With the following instrument(s): Curette to remove Viable and Non-Viable tissue/material. Material removed includes Subcutaneous Tissue and Slough and after achieving pain control using Lidocaine 4% T opical Solution. No specimens were taken. [name] time out was conducted at 15:01, prior to the start of the procedure. [name] Minimum amount of bleeding was controlled with Pressure. The procedure was tolerated well. Post Debridement Measurements: 3.5cm length x 1cm width x 0.1cm depth; 0.275cm^3 volume. Character of Wound/Ulcer Post Debridement requires further debridement. Severity of Tissue Post Debridement is: Fat layer exposed. Post procedure Diagnosis Wound #2: Same as Pre-Procedure General Notes: Scribed for DR. Lady Garyannon by Tommie ArdJamie Zochol, RN. [name], Ayaansh [name] ([id number]) [id number] Page 9 of 11 Plan Follow-up Appointments: Return Appointment in 1 week. - Dr. Lady Garyannon - room 2 - Anesthetic: Wound #2 Left,Medial Ankle: (In clinic) Topical Lidocaine 5% applied to wound bed - prior to debridement Bathing/ Shower/ Hygiene: May shower and wash wound with soap and water. Edema Control - Lymphedema / SCD / Other: Elevate legs to the level of the heart or above for 30 minutes daily and/or when sitting for 3-4 times [name] day throughout the day. Avoid standing for long periods of time. The following medication(s) was prescribed: mupirocin topical 2 % ointment Apply to wound with dressing changes starting 07/18/2022 WOUND #2: - Ankle Wound Laterality: Left, Medial Cleanser: Normal Saline 1 x Per Day/30  Days Discharge Instructions: Cleanse the wound with Normal Saline prior to applying [name] clean dressing using gauze sponges, not tissue or cotton balls. Peri-Wound Care: Sween Lotion (Moisturizing lotion) 1 x Per Day/30 Days Discharge Instructions: Apply moisturizing lotion as directed Topical: Mupirocin Ointment 1 x Per Day/30 Days Discharge Instructions: Apply Mupirocin (Bactroban) as instructed Prim Dressing: Sorbalgon AG Dressing 2x2 (in/in) (Dispense As Written) 1 x Per Day/30 Days ary Discharge Instructions: Apply to wound bed as instructed Secondary Dressing: ABD Pad, 5x9 (Generic) 1 x Per Day/30 Days Discharge Instructions: Apply over primary dressing as directed. Secured With: Transpore Surgical T ape, 2x10 (in/yd) 1 x Per Day/30 Days Discharge Instructions: Secure dressing with tape as directed. Com pression Wrap: Kerlix Roll 4.5x3.1 (in/yd) (Generic) 1 x Per Day/30 Days Discharge Instructions: Apply Kerlix and Coban compression as directed. Com pression Wrap: Coban Self-Adherent Wrap 4x5 (in/yd) (Generic) 1 x Per Day/30 Days Discharge Instructions: Apply over Kerlix as directed. 07/18/2022: The wound measured larger this week due to small satellite areas opening in [name] fan pattern distal to the primary wound. He continues to accumulate slough on the wound surface. I used [name] curette to debride slough and nonviable subcutaneous tissue from his wound. Due to the culture results, I am going to change the topical antibiotic to mupirocin. I do not think he requires systemic antibiotics at this point. Continue silver alginate with Kerlix and Coban wrapping. Follow-up in 1 week. Electronic Signature(s) Signed: 07/18/2022 3:09:38 PM By: Duanne Guessannon, Wilkins Elpers MD FACS Entered By:  [name] on 07/18/2022 15:09:38 -------------------------------------------------------------------------------- HxROS Details Patient Name: Date of Service: NA [name] 07/18/2022 2:45 PM Medical Record Number:  [id number] Patient Account Number: [id number] Date of Birth/Sex: Treating RN: 07/17/1955 (67 y.o. M) Primary Care Provider: [name] Other Clinician: Referring Provider: Treating Provider/Extender: [name] Weeks in Treatment: 2 Information Obtained From Patient Eyes Medical History: Positive for: Cataracts - Removed 2008 Cardiovascular Medical History: Positive for: Coronary Artery Disease; Hypertension; Myocardial Infarction; Peripheral Arterial Disease Past Medical History Notes: Hypercholesterolemia Abnormal EKG CABG X3 2019 HEARLD, MICHALK [name] ([id number]) [phone].pdf Page 10 of 11 Gastrointestinal Medical History: Past Medical History Notes: GERD Endocrine Medical History: Positive for: Type II Diabetes Time with diabetes: 24 years Treated with: Insulin, Oral agents Blood sugar tested every day: Yes Tested : Musculoskeletal Medical History: Positive for: Osteomyelitis Past Medical History Notes: Diabetic foot ulcer Neurologic Medical History: Positive for: Neuropathy HBO Extended History Items Eyes: Cataracts Immunizations Pneumococcal Vaccine: Received Pneumococcal Vaccination: Yes Received Pneumococcal Vaccination On or After 60th Birthday: Yes Implantable Devices Yes Family and Social History Cancer: Yes - Father; Diabetes: Yes - Father,Mother,Paternal Grandparents; Heart Disease: Yes - Father; Hereditary Spherocytosis: No; Hypertension: Yes - Father; Kidney Disease: No; Lung Disease: No; Seizures: No; Stroke: No; Thyroid Problems: No; Tuberculosis: No; Never smoker; Marital Status - Married; Alcohol Use: Rarely; Drug Use: No History; Caffeine Use: Daily; Financial Concerns: No; Food, Clothing or Shelter Needs: No; Support System Lacking: No; Transportation Concerns: No Electronic Signature(s) Signed: 07/18/2022 3:18:38 PM By: [name] MD FACS Entered By: [name] on 07/18/2022  15:07:26 -------------------------------------------------------------------------------- SuperBill Details Patient Name: Date of Service: NA Colin Philips [name]. 07/18/2022 Medical Record Number: [id number] Patient Account Number: [id number] Date of Birth/Sex: Treating RN: 13-Mar-1956 (67 y.o. M) Primary Care Provider: [name] Other Clinician: Referring Provider: Treating Provider/Extender: [name] Weeks in Treatment: 2 Diagnosis Coding ICD-10 Codes Code Description [phone] Non-pressure chronic ulcer of left ankle with fat layer exposed I73.9 Peripheral vascular disease, unspecified E11.622 Type 2 diabetes mellitus with other skin ulcer Laraia, Earl [name] ([id number]) [phone].pdf Page 11 of [place] Procedures : CPT4 Code: [id number] Description: [place] TISSUE 20 SQ CM/< ICD-10 Diagnosis Description [id number] Non-pressure chronic ulcer of left ankle with fat layer exposed Modifier: Quantity: 1 Physician Procedures : CPT4 Code Description Modifier [id number] - WC PHYS LEVEL 4 - EST PT 25 ICD-10 Diagnosis Description [id number] Non-pressure chronic ulcer of left ankle with fat layer exposed E11.622 Type 2 diabetes mellitus with other skin ulcer I73.9 Peripheral  vascular disease, unspecified Quantity: 1 : [id number] - WC PHYS SUBQ TISS 20 SQ CM ICD-10 Diagnosis Description [id number] Non-pressure chronic ulcer of left ankle with fat layer exposed Quantity: 1 Electronic Signature(s) Signed: 07/18/2022 3:10:26 PM By: [name] MD FACS Entered By: [name] on 07/18/2022 15:10:25

## 2022-07-23 ENCOUNTER — Other Ambulatory Visit: Payer: Self-pay | Admitting: Internal Medicine

## 2022-07-23 DIAGNOSIS — M86472 Chronic osteomyelitis with draining sinus, left ankle and foot: Secondary | ICD-10-CM

## 2022-07-25 ENCOUNTER — Encounter (HOSPITAL_BASED_OUTPATIENT_CLINIC_OR_DEPARTMENT_OTHER): Payer: BC Managed Care – PPO | Admitting: General Surgery

## 2022-07-25 DIAGNOSIS — L97322 Non-pressure chronic ulcer of left ankle with fat layer exposed: Secondary | ICD-10-CM | POA: Diagnosis not present

## 2022-07-25 DIAGNOSIS — E1151 Type 2 diabetes mellitus with diabetic peripheral angiopathy without gangrene: Secondary | ICD-10-CM | POA: Diagnosis not present

## 2022-07-25 DIAGNOSIS — I10 Essential (primary) hypertension: Secondary | ICD-10-CM | POA: Diagnosis not present

## 2022-07-25 DIAGNOSIS — I2510 Atherosclerotic heart disease of native coronary artery without angina pectoris: Secondary | ICD-10-CM | POA: Diagnosis not present

## 2022-07-25 DIAGNOSIS — E7800 Pure hypercholesterolemia, unspecified: Secondary | ICD-10-CM | POA: Diagnosis not present

## 2022-07-25 DIAGNOSIS — Z833 Family history of diabetes mellitus: Secondary | ICD-10-CM | POA: Diagnosis not present

## 2022-07-25 DIAGNOSIS — E1140 Type 2 diabetes mellitus with diabetic neuropathy, unspecified: Secondary | ICD-10-CM | POA: Diagnosis not present

## 2022-07-25 DIAGNOSIS — Z951 Presence of aortocoronary bypass graft: Secondary | ICD-10-CM | POA: Diagnosis not present

## 2022-07-25 DIAGNOSIS — E11622 Type 2 diabetes mellitus with other skin ulcer: Secondary | ICD-10-CM | POA: Diagnosis not present

## 2022-07-25 DIAGNOSIS — K219 Gastro-esophageal reflux disease without esophagitis: Secondary | ICD-10-CM | POA: Diagnosis not present

## 2022-07-25 NOTE — Telephone Encounter (Signed)
Do you want to continue filling? He's overdue for follow up, I sent a message asking him to call and schedule.   [name]

## 2022-07-25 NOTE — Progress Notes (Signed)
Colin, [name] ([id number]) [id number] Page 1 of 8 Visit Report for 07/25/2022 Arrival Information Details Patient Name: Date of Service: NA [name] 07/25/2022 2:45 PM Medical Record Number: [id number] Patient Account Number: [id number] Date of Birth/Sex: Treating RN: 26-Sep-1955 (67 y.o. [name] Primary Care Sayvon Arterberry: [name] Other Clinician: Referring Medford Staheli: Treating Derriana Oser/Extender: [name] in Treatment: 3 Visit Information History Since Last Visit Added or deleted any medications: No Patient Arrived: Knee Scooter Any new allergies or adverse reactions: No Arrival Time: 14:40 Had [name] fall or experienced change in No Accompanied By: self activities of daily living that may affect Transfer Assistance: None risk of falls: Patient Identification Verified: Yes Signs or symptoms of abuse/neglect since last visito No Secondary Verification Process Completed: Yes Hospitalized since last visit: No Patient Requires Transmission-Based Precautions: No Implantable device outside of the clinic excluding No Patient Has Alerts: No cellular tissue based products placed in the center since last visit: Has Dressing in Place as Prescribed: Yes Pain Present Now: No Electronic Signature(s) Signed: 07/25/2022 4:32:39 PM By: Adline Peals Entered By: Adline Peals on 07/25/2022 14:41:12 -------------------------------------------------------------------------------- Encounter Discharge Information Details Patient Name: Date of Service: NA [name]. 07/25/2022 2:45 PM Medical Record Number: [id number] Patient Account Number: [id number] Date of Birth/Sex: Treating RN: 03/31/1956 (67 y.o. [name] Primary Care Nuri Larmer: [name] Other Clinician: Referring Airon Sahni: Treating Shawni Volkov/Extender: [name] in Treatment: 3 Encounter Discharge Information Items Post  Procedure Vitals Discharge Condition: Stable Temperature (F): 97.6 Ambulatory Status: Knee Scooter Pulse (bpm): 75 Discharge Destination: Home Respiratory Rate (breaths/min): 16 Transportation: Private Auto Blood Pressure (mmHg): 178/93 Accompanied By: self Schedule Follow-up Appointment: Yes Clinical Summary of Care: Patient Declined Electronic Signature(s) Signed: 07/25/2022 4:32:39 PM By: Adline Peals Entered By: Adline Peals on 07/25/2022 15:04:57 [name] ([id number] Page 2 of 8 -------------------------------------------------------------------------------- Lower Extremity Assessment Details Patient Name: Date of Service: [name] 07/25/2022 2:45 PM Medical Record Number: [id number] Patient Account Number: [id number] Date of Birth/Sex: Treating RN: May 24, 1956 (67 y.o. [name] Primary Care Lilyan Prete: [name] Other Clinician: Referring Ayodele Hartsock: Treating Kyrielle Urbanski/Extender: [name] Weeks in Treatment: 3 Edema Assessment Assessed: [Left: No] [Right: No] [Left: Edema] [Right: :] Calf Left: Right: Point of Measurement: From Medial Instep 37 cm Ankle Left: Right: Point of Measurement: From Medial Instep 23 cm Vascular Assessment Pulses: Dorsalis Pedis Palpable: [Left:Yes] Electronic Signature(s) Signed: 07/25/2022 4:32:39 PM By: Adline Peals Entered By: Adline Peals on 07/25/2022 14:49:23 -------------------------------------------------------------------------------- Multi Wound Chart Details Patient Name: Date of Service: NA [name] 07/25/2022 2:45 PM Medical Record Number: [id number] Patient Account Number: [id number] Date of Birth/Sex: Treating RN: [birth year] (67 y.o. M) Primary Care Marlen Koman: [name] Other Clinician: Referring Krissy Orebaugh: Treating Greogry Goodwyn/Extender: [name] Weeks in Treatment: 3 Vital  Signs Height(in): 74 Capillary Blood Glucose(mg/dl): 132 Weight(lbs): 187 Pulse(bpm): 60 Body Mass Index(BMI): 24 Blood Pressure(mmHg): 178/93 Temperature(F): 97.6 Respiratory Rate(breaths/min): 16 [2:Photos:] [N/[name]:N/[name]] Left, Medial Ankle N/[name]/[name] Wound Location: Pressure Injury N/[name]/[name] Wounding Event: Diabetic Wound/Ulcer of the Lower N/[name]/[name] Primary Etiology: Extremity Cataracts, Coronary Artery Disease, N/[name]/[name] Comorbid History: Hypertension, Myocardial Infarction, Peripheral Arterial Disease, Type II Diabetes, Osteomyelitis, Neuropathy 06/13/2022 N/[name]/[name] Date Acquired: 3 N/[name]/[name] Weeks of Treatment: Open N/[name]/[name] Wound Status: No N/[name]/[name] Wound Recurrence: 2x0.9x0.1 N/[name]/[name] Measurements L x W x D (cm) 1.414 N/[name]/[name] [name] (cm) :  rea 0.141 N/[name]/[name] Volume (cm) : 30.80% N/[name]/[name] % Reduction in [name] rea: 30.90% N/[name]/[name] % Reduction in Volume: Grade 1 N/[name]/[name] Classification: Medium N/[name]/[name] Exudate [name] mount: Serous N/[name]/[name] Exudate Type: amber N/[name]/[name] Exudate Color: Distinct, outline attached N/[name]/[name] Wound Margin: Large (67-100%) N/[name]/[name] Granulation [name] mount: Small (1-33%) N/[name]/[name] Necrotic [name] mount: Fat Layer (Subcutaneous Tissue): Yes N/[name]/[name] Exposed Structures: Fascia: No Tendon: No Muscle: No Joint: No Bone: No Small (1-33%) N/[name]/[name] Epithelialization: Debridement - Excisional N/[name]/[name] Debridement: Pre-procedure Verification/Time Out 14:55 N/[name]/[name] Taken: Lidocaine 4% Topical Solution N/[name]/[name] Pain Control: Subcutaneous, Slough N/[name]/[name] Tissue Debrided: Skin/Subcutaneous Tissue N/[name]/[name] Level: 1.8 N/[name]/[name] Debridement [name] (sq cm): rea Curette N/[name]/[name] Instrument: Minimum N/[name]/[name] Bleeding: Pressure N/[name]/[name] Hemostasis [name] chieved: Procedure was tolerated well N/[name]/[name] Debridement Treatment Response: 2x0.9x0.1 N/[name]/[name] Post Debridement Measurements L x W x D (cm) 0.141 N/[name]/[name] Post Debridement Volume: (cm) No Abnormalities Noted N/[name]/[name] Periwound Skin  Texture: Maceration: Yes N/[name]/[name] Periwound Skin Moisture: Dry/Scaly: No No Abnormalities Noted N/[name]/[name] Periwound Skin Color: No Abnormality N/[name]/[name] Temperature: Debridement N/[name]/[name] Procedures Performed: Treatment Notes Wound #2 (Ankle) Wound Laterality: Left, Medial Cleanser Normal Saline Discharge Instruction: Cleanse the wound with Normal Saline prior to applying [name] clean dressing using gauze sponges, not tissue or cotton balls. Peri-Wound Care Sween Lotion (Moisturizing lotion) Discharge Instruction: Apply moisturizing lotion as directed Topical Mupirocin Ointment Discharge Instruction: Apply Mupirocin (Bactroban) as instructed Primary Dressing Sorbalgon AG Dressing 2x2 (in/in) Discharge Instruction: Apply to wound bed as instructed Secondary Dressing ABD Pad, 5x9 Discharge Instruction: Apply over primary dressing as directed. Optifoam Non-Adhesive Dressing, 4x4 in Discharge Instruction: Apply over primary dressing as directed. Secured With [employer], 2x10 (in/yd) JOHNEL, SILVERTHORN [name] ([id number]) [id number] Page 4 of 8 Discharge Instruction: Secure dressing with tape as directed. Compression Wrap Kerlix Roll 4.5x3.1 (in/yd) Discharge Instruction: Apply Kerlix and Coban compression as directed. Coban Self-Adherent Wrap 4x5 (in/yd) Discharge Instruction: Apply over Kerlix as directed. Compression Stockings Add-Ons Electronic Signature(s) Signed: 07/25/2022 3:14:00 PM By: [name] MD FACS Entered By: [name] on 07/25/2022 15:14:00 -------------------------------------------------------------------------------- Multi-Disciplinary Care Plan Details Patient Name: Date of Service: Colin Loveless [name]. 07/25/2022 2:45 PM Medical Record Number: [id number] Patient Account Number: [id number] Date of Birth/Sex: Treating RN: [birth year] (67 y.o. [name] Primary Care Shaheen Mende: [name] Other Clinician: Referring  Graceland Wachter: Treating Joylynn Defrancesco/Extender: [name] Weeks in Treatment: 3 Active Inactive Pressure Nursing Diagnoses: Knowledge deficit related to causes and risk factors for pressure ulcer development Knowledge deficit related to management of pressures ulcers Goals: Patient will remain free of pressure ulcers Date Initiated: 07/04/2022 Target Resolution Date: 09/09/2022 Goal Status: Active Interventions: Assess: immobility, friction, shearing, incontinence upon admission and as needed Assess potential for pressure ulcer upon admission and as needed Treatment Activities: Patient referred for pressure reduction/relief devices : 07/04/2022 Notes: Wound/Skin Impairment Nursing Diagnoses: Knowledge deficit related to smoking impact on wound healing Goals: Ulcer/skin breakdown will have [name] volume reduction of 30% by week 4 Date Initiated: 07/04/2022 Target Resolution Date: 09/09/2022 Goal Status: Active Interventions: Assess ulceration(s) every visit Provide education on ulcer and skin care Notes: Electronic Signature(s) [name], [name] ([id number]) [id number] Page 5 of 8 Signed: 07/25/2022 4:32:39 PM By: Adline Peals Entered By: Adline Peals on 07/25/2022 14:41:46 -------------------------------------------------------------------------------- Pain Assessment Details Patient Name: Date of Service: [name], [name]. 07/25/2022 2:45  PM Medical Record Number: [id number] Patient Account Number: [id number] Date of Birth/Sex: Treating RN: 05-20-1956 (67 y.o. [name] Primary Care Giankarlo Leamer: [name] Other Clinician: Referring Dhriti Fales: Treating Deamonte Sayegh/Extender: [name] Weeks in Treatment: 3 Active Problems Location of Pain Severity and Description of Pain Patient Has Paino No Site Locations Rate the pain. Current Pain Level: 0 Pain Management and Medication Current Pain  Management: Electronic Signature(s) Signed: 07/25/2022 4:32:39 PM By: Adline Peals Entered By: Adline Peals on 07/25/2022 14:41:31 -------------------------------------------------------------------------------- Patient/Caregiver Education Details Patient Name: Date of Service: NA [name] 2/12/2024andnbsp2:45 PM Medical Record Number: [id number] Patient Account Number: [id number] Date of Birth/Gender: Treating RN: November 17, 1955 (67 y.o. [name] Primary Care Physician: [name] Other Clinician: Referring Physician: Treating Physician/Extender: [name] in Treatment: 3 Education Assessment Education Provided To: Patient [name], [name] ([id number]) [id number] Page 6 of 8 Education Topics Provided Wound/Skin Impairment: Methods: Explain/Verbal Responses: Reinforcements needed, State content correctly Electronic Signature(s) Signed: 07/25/2022 4:32:39 PM By: Adline Peals Entered By: Adline Peals on 07/25/2022 14:42:05 -------------------------------------------------------------------------------- Wound Assessment Details Patient Name: Date of Service: Colin Loveless [name]. 07/25/2022 2:45 PM Medical Record Number: [id number] Patient Account Number: [id number] Date of Birth/Sex: Treating RN: 08-14-1955 (67 y.o. [name] Primary Care Elizah Lydon: [name] Other Clinician: Referring Jazzmon Prindle: Treating Vishaal Strollo/Extender: [name] Weeks in Treatment: 3 Wound Status Wound Number: 2 Primary Diabetic Wound/Ulcer of the Lower Extremity Etiology: Wound Location: Left, Medial Ankle Wound Open Wounding Event: Pressure Injury Status: Date Acquired: 06/13/2022 Comorbid Cataracts, Coronary Artery Disease, Hypertension, Myocardial Weeks Of Treatment: 3 History: Infarction, Peripheral Arterial Disease, Type II Diabetes, Clustered Wound: No Osteomyelitis,  Neuropathy Photos Wound Measurements Length: (cm) 2 Width: (cm) 0.9 Depth: (cm) 0.1 Area: (cm) 1.414 Volume: (cm) 0.141 % Reduction in Area: 30.8% % Reduction in Volume: 30.9% Epithelialization: Small (1-33%) Tunneling: No Undermining: No Wound Description Classification: Grade 1 Wound Margin: Distinct, outline attached Exudate Amount: Medium Exudate Type: Serous Exudate Color: amber Foul Odor After Cleansing: No Slough/Fibrino Yes Wound Bed Granulation Amount: Large (67-100%) Exposed Structure Necrotic Amount: Small (1-33%) Fascia Exposed: No Necrotic Quality: Adherent Slough Fat Layer (Subcutaneous Tissue) Exposed: Yes Tendon Exposed: No Muscle Exposed: No Joint Exposed: No Bone Exposed: No Carpenter, [name] ([id number] Page 7 of 8 Periwound Skin Texture Texture Color No Abnormalities Noted: Yes No Abnormalities Noted: Yes Moisture Temperature / Pain No Abnormalities Noted: No Temperature: No Abnormality Dry / Scaly: No Maceration: Yes Treatment Notes Wound #2 (Ankle) Wound Laterality: Left, Medial Cleanser Normal Saline Discharge Instruction: Cleanse the wound with Normal Saline prior to applying [name] clean dressing using gauze sponges, not tissue or cotton balls. Peri-Wound Care Sween Lotion (Moisturizing lotion) Discharge Instruction: Apply moisturizing lotion as directed Topical Mupirocin Ointment Discharge Instruction: Apply Mupirocin (Bactroban) as instructed Primary Dressing Sorbalgon AG Dressing 2x2 (in/in) Discharge Instruction: Apply to wound bed as instructed Secondary Dressing ABD Pad, 5x9 Discharge Instruction: Apply over primary dressing as directed. Optifoam Non-Adhesive Dressing, 4x4 in Discharge Instruction: Apply over primary dressing as directed. Secured With [employer] Tape, 2x10 (in/yd) Discharge Instruction: Secure dressing with tape as directed. Compression Wrap Kerlix Roll 4.5x3.1  (in/yd) Discharge Instruction: Apply Kerlix and Coban compression as directed. Coban Self-Adherent Wrap 4x5 (in/yd) Discharge Instruction: Apply over Kerlix as directed. Compression Stockings Add-Ons Electronic Signature(s) Signed: 07/25/2022 4:32:39 PM By: Adline Peals Entered By: Adline Peals on 07/25/2022 14:52:03 -------------------------------------------------------------------------------- Vitals Details Patient Name: Date of Service: NA  [name]. 07/25/2022 2:45 PM Medical Record Number: [id number] Patient Account Number: [id number] Date of Birth/Sex: Treating RN: 1956/04/19 (67 y.o. [name] Primary Care Elea Holtzclaw: [name] Other Clinician: Referring Hitomi Slape: Treating Nioka Thorington/Extender: [name] Weeks in Treatment: 3 Vital Signs Time Taken: 14:44 Temperature (F): 97.6 Height (in): 74 Pulse (bpm): 75 [place], Rhonin [name] ([id number]) [id number] Page 8 of 8 Weight (lbs): 187 Respiratory Rate (breaths/min): 16 Body Mass Index (BMI): 24 Blood Pressure (mmHg): 178/93 Capillary Blood Glucose (mg/dl): 132 Reference Range: 80 - 120 mg / dl Electronic Signature(s) Signed: 07/25/2022 4:32:39 PM By: Adline Peals Entered By: Adline Peals on 07/25/2022 14:46:58

## 2022-07-25 NOTE — Progress Notes (Signed)
[name] [name] ([id number]) [id number] Page 1 of 11 Visit Report for 07/25/2022 Chief Complaint Document Details Patient Name: Date of Service: NA [name] 07/25/2022 2:45 PM Medical Record Number: [id number] Patient Account Number: [id number] Date of Birth/Sex: Treating RN: 11[birth year]/[name] (67 y.o. M) Primary Care Provider: Cher [name] Other Clinician: Referring Provider: Treating Provider/Extender: [name] Weeks in Treatment: 3 Information Obtained from: Patient Chief Complaint Patients presents for treatment of an open diabetic ulcer with exposed bone and osteomyelitis 07/04/2022: Diabetic ankle ulcer Electronic Signature(s) Signed: 07/25/2022 3:14:07 PM By: Colin Maudlin MD FACS Entered By: [name] on 07/25/2022 15:14:07 -------------------------------------------------------------------------------- Debridement Details Patient Name: Date of Service: NA [name]. 07/25/2022 2:45 PM Medical Record Number: [id number] Patient Account Number: [id number] Date of Birth/Sex: Treating RN: [birth year][name] (68 y.o. [name] Primary Care Provider: Cher [name] Other Clinician: Referring Provider: Treating Provider/Extender: [name] Weeks in Treatment: 3 Debridement Performed for Assessment: Wound #2 Left,Medial Ankle Performed By: Physician Colin Maudlin, MD Debridement Type: Debridement Severity of Tissue Pre Debridement: Fat layer exposed Level of Consciousness (Pre-procedure): Awake and Alert Pre-procedure Verification/Time Out Yes - 14:55 Taken: Start Time: 14:55 Pain Control: Lidocaine 4% T opical Solution T Area Debrided (L x W): otal 2 (cm) x 0.9 (cm) = 1.8 (cm) Tissue and other material debrided: Non-Viable, Slough, Subcutaneous, Slough Level: Skin/Subcutaneous Tissue Debridement Description: Excisional Instrument: Curette Bleeding: Minimum Hemostasis Achieved:  Pressure Response to Treatment: Procedure was tolerated well Level of Consciousness (Post- Awake and Alert procedure): Post Debridement Measurements of Total Wound Length: (cm) 2 Width: (cm) 0.9 Depth: (cm) 0.1 Volume: (cm) 0.141 Character of Wound/Ulcer Post Debridement: Improved Severity of Tissue Post Debridement: Fat layer exposed Putz, [name] ([id number] Page 2 of 11 Post Procedure Diagnosis Same as Pre-procedure Notes scribed for Dr. Celine [name] by Colin Peals, RN Electronic Signature(s) Signed: 07/25/2022 4:32:39 PM By: [name] Signed: 07/25/2022 4:34:30 PM By: Colin Maudlin MD FACS Entered By: [name] on 07/25/2022 14:56:47 -------------------------------------------------------------------------------- HPI Details Patient Name: Date of Service: NA [name]. 07/25/2022 2:45 PM Medical Record Number: [id number] Patient Account Number: [id number] Date of Birth/Sex: Treating RN: [birth year] (67 y.o. M) Primary Care Provider: Cher [name] Other Clinician: Referring Provider: Treating Provider/Extender: [name] Weeks in Treatment: 3 History of Present Illness HPI Description: ADMISSION 08/25/2021 This is [name] 67 year old man who initially presented to his primary care provider in September 2022 with pain in his left foot. He was sent for an x-ray and while the x-ray was being performed, the tech pointed out [name] wound on his foot that the patient was not aware existed. He does have type 2 diabetes with significant neuropathy. His diabetes is suboptimally controlled with his most recent A1c being 8.5. He also has [name] history of coronary artery disease status post three- vessel CABG. he was initially seen by orthopedics, but they referred him to [facility]. He has undergone at least 7 operations/debridements and several applications of skin substitute under the care of  podiatry. He has been in [name] for much of this time. His most recent procedure was July 28, 2021. [name] was biopsied and was found to be consistent with osteomyelitis. Culture also returned positive for corynebacterium. He was seen on August 16, 2021 by infectious disease. [name] PICC line has been placed and he will be receiving [name] 6-week course of IV daptomycin and cefepime. In  October 2022, he underwent lower extremity vascular studies. Results are copied here: Right: Resting right ankle-brachial index is within normal range. No evidence of significant right lower extremity arterial disease. The right toe-brachial index is abnormal. Left: Resting left ankle-brachial index indicates mild left lower extremity arterial disease. The left toe-brachial index is abnormal. He has not been seen by vascular surgery despite these findings. He presented to clinic today in [name] and is using [name] to offload. Wound VAC was in place. Once this was removed, [name] large ulcer was identified on the left midfoot/ankle. Bone is frankly exposed. There is no malodorous or purulent drainage. There is some granulation tissue over the central portion of the exposed bone. There is [name] tunnel that extends posteriorly for roughly 10 cm. It has been discussed with him by multiple providers that he is at very high risk of losing his lower leg because of this wound. He is extremely eager to avoid this outcome and is here today to review his options as well as receive ongoing wound care. 09/03/2021: Here for reevaluation of his wound. There does not appear to have been any substantial improvement overall since our last visit. He has been in [name] with white foam overlying the exposed bone. We are working on getting him approved for hyperbaric oxygen therapy. 09/10/2021: We are in the process of getting him cleared to begin hyperbaric oxygen therapy. He still needs to obtain [name] chest x-ray.  Although the wound measurements are roughly the same, I think the overall appearance of the wound is better. The exposed bone has [name] bit more granulation tissue covering it. He has not received [name] vascular surgery appointment to reevaluate his flow to the wound. 09/17/2021: He has been approved for hyperbaric oxygen therapy and completed his chest x-ray, which I reviewed and it appears normal. The tunnels at the 12 and 10:00 positions are smaller. There is more granulation tissue covering the exposed bone and the undermining has decreased. He still has not received [name] vascular surgery appointment. 09/24/2021: He initiated hyperbaric oxygen therapy this week and is tolerating it well. He has an appointment with vascular surgery coming up on May 16. The granulation tissue is covering more of the exposed bone and both tunnels are [name] bit smaller. 10/01/2021: He continues to tolerate hyperbaric oxygen therapy. He saw infectious disease and they are planning to pull his PICC line. He has been initiated on oral antibiotics (doxycycline and Augmentin). The wound looks about the same but the tunnels are [name] little bit smaller. The skin seems to be contracting somewhat around the exposed bone. 10/08/2021: The wound is still about the same size, but the tunnels continue to come in and the skin is contracting around the exposed bone. He continues to have some accumulation of necrotic material in the inferoposterior aspect of the wound as well as accumulation at the 12:00 tunnel area. 10/15/2021: The wound is smaller today. The tunnels continue to come in. There is less necrotic tissue present. He does have some periwound maceration. 10/22/2021: The wound is about the same size. There is [name] little bit less undermining at the distal portion. The exposed bone is dark and I am not sure if this is staining from silver nitrate or his VAC sponge or if it represents necrosis. The tunnels are shallower but he does have some serous  drainage coming from the [facility] [name] ([id number]) [id number].pdf Page 3 of 11 10:00 tunnel. He continues to tolerate hyperbaric  oxygen therapy well. 10/29/2021: The undermining continues to improve. The tunnels are about the same. He has good granulation tissue overlying the majority of the exposed bone. It does appear that perhaps the tubing from his wound VAC has been eroding the skin at the 12 clock position. He continues to accumulate senescent epithelium around the borders of the wound. 11/05/2021: The undermining is almost completely resolved. The tunnels have contracted fairly significantly. No significant slough or debris accumulation. There is still senescent epithelium accumulation around the borders of the wound. He has been tolerating hyperbaric oxygen therapy well. 11/12/2021: Despite the measurements of the wound being about the same, the wound has changed in its shape and overall, I think it is improved. The undermining has resolved and the tunnels continue to shorten. There is good granulation tissue encroaching over the small area of bone that has remained exposed at the 12 o'clock position. Minimal slough accumulation. He continues to tolerate hyperbaric oxygen therapy well. 11/19/2021: I took [name] PCR culture last week. There was overgrowth of yeast. He is already taking suppressive doxycycline and Augmentin. I added fluconazole to his regimen. The wound is smaller again today. The tunnels continue to shorten. He continues to do well with hyperbaric oxygen therapy. 11/26/2021: For some reason, his foot has become macerated. The wound is narrower but about the same dimensions in its longitudinal aspect. The tunnels continue to shorten. He has some slough buildup on the wound as well as some heaped up senescent epithelium around the perimeter. 12/03/2021: No further maceration of his foot has occurred. The wound has contracted quite significantly from last week. The  tunnel at 10:00 is closed. The tunnel at 12:00 is down to just [name] couple of millimeters. No other undermining is present. There is soft tissue coverage of the previously exposed bone. There is just [name] bit of slough and biofilm on the wound surface. 12/10/2021: The wound is looking good. It turns out the tunnel at 12:00 is only exposed when the patient dorsiflexes his foot. It is about 2 cm in depth when he does this; when his foot is in plantarflexion, the tunnel is closed. The bone that was visible at the 12:00 tunnel is completely covered with granulation tissue, but there does feel like some exposed bone deeper into the tunnel area. There is senescent skin heaped up around the periphery. Minimal slough on the wound surface. 12/16/2021: The wound dimensions are roughly the same. The surface has nice granulation tissue. The exposed bone at the 12:00 tunnel continues to be covered with more soft tissue. 7/14; patient's wound measures smaller today. Using the wound VAC with underlying collagen. He is also being treated with HBO for underlying osteomyelitis. He tells me he is on doxycycline and ampicillin follows with infectious disease next week 12/31/2021: The wound continues to contract. Unfortunately, the area where the track pad and tubing have been rubbing continues to look like it is applying friction. He says that the home health nurses that have been applying the [facility] have been putting gauze underneath the tubing, but nonetheless there is ongoing tissue breakdown at this site. Light slough accumulation on the wound surface. The tunnel continues to contract. He is tolerating HBO without difficulty. 01/07/2022: Bridging the wound VAC away from the ankle has resulted in significant improvement in the tissue at the apex of the wound. The tunnel is still present and is not all that much shorter, but the overall wound surface is very robust and healthy looking. Minimal slough accumulation.  No concern for  acute infection. 01/21/2022: The wound continues to contract and has [name] robust granulation tissue surface. The tunnel has come in considerably and is down to about 1.4 cm. There is still bone exposed within the tunnel but the rest of it is well covered. There is some senescent epithelium at the wound margins and [name] little bit of slough on the surface. 01/28/2022: No significant change in the wound this week, but there has not been any reaccumulation of senescent epithelium or slough. The tunnel is perhaps [name] millimeter less in depth. He has been approved for Apligraf and we will apply this today. 02/04/2022: The wound has contracted somewhat and the tunnel has filled in completely. The wound surface is clean. He is here for Apligraf #2. 02/11/2022: The wound has contracted further and is now nearly flush with the surrounding skin surface. Light layer of slough. Apligraf #3 plan for today. 02/18/2022: There is [name] band of epithelium trying to cut across the superior portion of the wound. There is robust granulation tissue with just [name] light layer of slough and biofilm on the surface. There was [name] little bit of greenish drainage in the wound VAC but none appreciated on the site itself. He is here for Apligraf #4. 03/04/2022: The wound has contracted considerably. There is good granulation tissue on the surface. Minimal biofilm. He is here for Apligraf #5. 03/18/2022: The wound has epithelialized to the point that it has been divided into 2 areas. Both areas are smaller in total than at his previous visit. There is good granulation tissue present with just [name] little bit of periwound eschar and surface slough. 10/[name]; left medial foot. The patient has completed treatment with hyperbaric oxygen for underlying osteomyelitis he has had [name] nice response in the wound. Noted today that he still had some greenish drainage. Previously he has been treated with topical gentamicin but apparently that was stopped 2 weeks  ago. Fortunately the wound is measuring smaller 10/20; wound looks better no hypergranulation minimal drainage. Granulation tissue looks healthy using gentamicin Hydrofera Blue under compression 04/08/2022: The wound continues to contract tremendously. The response to his treatment for hypertrophic granulation tissue has been quite remarkable. There are just [name] couple of open areas remaining. 04/18/2022: His wound is healed. READMISSION 07/04/2022: He returns to clinic with [name] new ulcer adjacent to where his previous large wound had been. He says that he was wearing boots and using [name] small padded dressing to protect the freshly healed ankle wound. Apparently the dressing rolled up and rubbed on his skin, causing [name] new wound. He contacted our office last week concerned about the appearance. There is [name] small oval wound on his left medial ankle. It is fairly clean with some periwound dry skin and eschar. 07/11/2022: The wound is about the same size this week. There is [name] layer of rubbery slough on the surface. It has [name] fairly strong odor. 07/18/2022: The wound measured larger this week due to small satellite areas opening in [name] fan pattern distal to the primary wound. He continues to accumulate slough on the wound surface. The culture that I took last week only grew out low levels of skin flora. 07/25/2022: The satellite areas that had opened last week have closed. The main wound is smaller with some slough accumulation. Electronic Signature(s) Signed: 07/25/2022 3:14:41 PM By: Colin Maudlin MD FACS Entered By: [name] on 07/25/2022 15:14:41 [name] ([id number] Page 4 of 11 -------------------------------------------------------------------------------- Physical Exam Details Patient Name:  Date of Service: NA [name] 07/25/2022 2:45 PM Medical Record Number: [id number] Patient Account Number: [id number] Date of Birth/Sex: Treating  RN: [name], [name] (67 y.o. M) Primary Care Provider: Cher [name] Other Clinician: Referring Provider: Treating Provider/Extender: [name] Weeks in Treatment: 3 Constitutional Hypertensive, asymptomatic. . . . no acute distress. Respiratory Normal work of breathing on room air. Notes 07/25/2022: The satellite areas that had opened last week have closed. The main wound is smaller with some slough accumulation. Electronic Signature(s) Signed: 07/25/2022 3:15:10 PM By: Colin Maudlin MD FACS Entered By: [name] on 07/25/2022 15:15:10 -------------------------------------------------------------------------------- Physician Orders Details Patient Name: Date of Service: NA [name]. 07/25/2022 2:45 PM Medical Record Number: [id number] Patient Account Number: [id number] Date of Birth/Sex: Treating RN: [birth year]-[name] (67 y.o. [name] Primary Care Provider: Cher [name] Other Clinician: Referring Provider: Treating Provider/Extender: [name] in Treatment: 3 Verbal / Phone Orders: No Diagnosis Coding ICD-10 Coding Code Description [phone] Non-pressure chronic ulcer of left ankle with fat layer exposed I73.9 Peripheral vascular disease, unspecified E11.622 Type 2 diabetes mellitus with other skin ulcer Follow-up Appointments ppointment in 1 week. - Dr. Celine [name] - room 2 - Return [name] Anesthetic Wound #2 Left,Medial Ankle (In clinic) Topical Lidocaine 4% applied to wound bed Bathing/ Shower/ Hygiene May shower and wash wound with soap and water. Edema Control - Lymphedema / SCD / Other Elevate legs to the level of the heart or above for 30 minutes daily and/or when sitting for 3-4 times [name] day throughout the day. Avoid standing for long periods of time. Wound Treatment Wound #2 - Ankle Wound Laterality: Left, Medial Cleanser: Normal Saline 1 x Per Day/30 Days Discharge Instructions: Cleanse the wound with Normal Saline  prior to applying [name] clean dressing using gauze sponges, not tissue or cotton balls. [name], VLAHOS [name] ([id number]) [id number] Page 5 of 11 Peri-Wound Care: Sween Lotion (Moisturizing lotion) 1 x Per Day/30 Days Discharge Instructions: Apply moisturizing lotion as directed Topical: Mupirocin Ointment 1 x Per Day/30 Days Discharge Instructions: Apply Mupirocin (Bactroban) as instructed Prim Dressing: Bracken 2x2 (in/in) (Dispense As Written) 1 x Per Day/30 Days ary Discharge Instructions: Apply to wound bed as instructed Secondary Dressing: ABD Pad, 5x9 (Generic) 1 x Per Day/30 Days Discharge Instructions: Apply over primary dressing as directed. Secondary Dressing: Optifoam Non-Adhesive Dressing, 4x4 in 1 x Per Day/30 Days Discharge Instructions: Apply over primary dressing as directed. Secured With: Transpore Surgical Tape, 2x10 (in/yd) 1 x Per Day/30 Days Discharge Instructions: Secure dressing with tape as directed. Compression Wrap: Kerlix Roll 4.5x3.1 (in/yd) (Generic) 1 x Per Day/30 Days Discharge Instructions: Apply Kerlix and Coban compression as directed. Compression Wrap: Coban Self-Adherent Wrap 4x5 (in/yd) (Generic) 1 x Per Day/30 Days Discharge Instructions: Apply over Kerlix as directed. Patient Medications llergies: codeine [name] Notifications Medication Indication Start End 07/25/2022 lidocaine DOSE topical 4 % cream - cream topical Electronic Signature(s) Signed: 07/25/2022 4:34:30 PM By: Colin Maudlin MD FACS Entered By: [name] on 07/25/2022 15:15:21 -------------------------------------------------------------------------------- Problem List Details Patient Name: Date of Service: NA [name]. 07/25/2022 2:45 PM Medical Record Number: [id number] Patient Account Number: [id number] Date of Birth/Sex: Treating RN: [name][birth year] (67 y.o. M) Primary Care Provider: Cher [name] Other Clinician: Referring  Provider: Treating Provider/Extender: [name] Weeks in Treatment: 3 Active Problems ICD-10 Encounter Code Description Active Date MDM Diagnosis L97.322 Non-pressure chronic ulcer of left ankle with fat layer exposed 07/04/2022  No Yes I73.9 Peripheral vascular disease, unspecified 07/04/2022 No Yes E11.622 Type 2 diabetes mellitus with other skin ulcer 07/04/2022 No Yes [name], [name] ([id number]) [id number].pdf Page 6 of 11 Inactive Problems Resolved Problems Electronic Signature(s) Signed: 07/25/2022 3:[name]:48 PM By: Colin Maudlin MD FACS Entered By: [name] on 07/25/2022 15:[name]:48 -------------------------------------------------------------------------------- Progress Note Details Patient Name: Date of Service: NA [name]. 07/25/2022 2:45 PM Medical Record Number: [id number] Patient Account Number: [id number] Date of Birth/Sex: Treating RN: [name]-[birth year] (67 y.o. M) Primary Care Provider: Cher [name] Other Clinician: Referring Provider: Treating Provider/Extender: [name] Weeks in Treatment: 3 Subjective Chief Complaint Information obtained from Patient Patients presents for treatment of an open diabetic ulcer with exposed bone and osteomyelitis 07/04/2022: Diabetic ankle ulcer History of Present Illness (HPI) ADMISSION 08/25/2021 This is [name] 67 year old man who initially presented to his primary care provider in September 2022 with pain in his left foot. He was sent for an x-ray and while the x-ray was being performed, the tech pointed out [name] wound on his foot that the patient was not aware existed. He does have type 2 diabetes with significant neuropathy. His diabetes is suboptimally controlled with his most recent A1c being 8.5. He also has [name] history of coronary artery disease status post three- vessel CABG. he was initially seen by orthopedics, but they referred him to [facility]. He  has undergone at least 7 operations/debridements and several applications of skin substitute under the care of podiatry. He has been in [name] for much of this time. His most recent procedure was July 28, 2021. [name] was biopsied and was found to be consistent with osteomyelitis. Culture also returned positive for corynebacterium. He was seen on August 16, 2021 by infectious disease. [name] PICC line has been placed and he will be receiving [name] 6-week course of IV daptomycin and cefepime. In October 2022, he underwent lower extremity vascular studies. Results are copied here: Right: Resting right ankle-brachial index is within normal range. No evidence of significant right lower extremity arterial disease. The right toe-brachial index is abnormal. Left: Resting left ankle-brachial index indicates mild left lower extremity arterial disease. The left toe-brachial index is abnormal. He has not been seen by vascular surgery despite these findings. He presented to clinic today in [name] and is using [name] to offload. Wound VAC was in place. Once this was removed, [name] large ulcer was identified on the left midfoot/ankle. Bone is frankly exposed. There is no malodorous or purulent drainage. There is some granulation tissue over the central portion of the exposed bone. There is [name] tunnel that extends posteriorly for roughly 10 cm. It has been discussed with him by multiple providers that he is at very high risk of losing his lower leg because of this wound. He is extremely eager to avoid this outcome and is here today to review his options as well as receive ongoing wound care. 09/03/2021: Here for reevaluation of his wound. There does not appear to have been any substantial improvement overall since our last visit. He has been in [name] with white foam overlying the exposed bone. We are working on getting him approved for hyperbaric oxygen therapy. 09/10/2021: We are in the  process of getting him cleared to begin hyperbaric oxygen therapy. He still needs to obtain [name] chest x-ray. Although the wound measurements are roughly the same, I think the overall appearance of the  wound is better. The exposed bone has [name] bit more granulation tissue covering it. He has not received [name] vascular surgery appointment to reevaluate his flow to the wound. 09/17/2021: He has been approved for hyperbaric oxygen therapy and completed his chest x-ray, which I reviewed and it appears normal. The tunnels at the 12 and 10:00 positions are smaller. There is more granulation tissue covering the exposed bone and the undermining has decreased. He still has not received [name] vascular surgery appointment. 09/24/2021: He initiated hyperbaric oxygen therapy this week and is tolerating it well. He has an appointment with vascular surgery coming up on May 16. The granulation tissue is covering more of the exposed bone and both tunnels are [name] bit smaller. 10/01/2021: He continues to tolerate hyperbaric oxygen therapy. He saw infectious disease and they are planning to pull his PICC line. He has been initiated on oral antibiotics (doxycycline and Augmentin). The wound looks about the same but the tunnels are [name] little bit smaller. The skin seems to be contracting somewhat around the exposed bone. 10/08/2021: The wound is still about the same size, but the tunnels continue to come in and the skin is contracting around the exposed bone. He continues to have some accumulation of necrotic material in the inferoposterior aspect of the wound as well as accumulation at the 12:00 tunnel area. 10/15/2021: The wound is smaller today. The tunnels continue to come in. There is less necrotic tissue present. He does have some periwound maceration. 10/22/2021: The wound is about the same size. There is [name] little bit less undermining at the distal portion. The exposed bone is dark and I am not sure if this is [name], [name] ([id number])  [id number] Page 7 of 11 staining from silver nitrate or his VAC sponge or if it represents necrosis. The tunnels are shallower but he does have some serous drainage coming from the 10:00 tunnel. He continues to tolerate hyperbaric oxygen therapy well. 10/29/2021: The undermining continues to improve. The tunnels are about the same. He has good granulation tissue overlying the majority of the exposed bone. It does appear that perhaps the tubing from his wound VAC has been eroding the skin at the 12 clock position. He continues to accumulate senescent epithelium around the borders of the wound. 11/05/2021: The undermining is almost completely resolved. The tunnels have contracted fairly significantly. No significant slough or debris accumulation. There is still senescent epithelium accumulation around the borders of the wound. He has been tolerating hyperbaric oxygen therapy well. 11/12/2021: Despite the measurements of the wound being about the same, the wound has changed in its shape and overall, I think it is improved. The undermining has resolved and the tunnels continue to shorten. There is good granulation tissue encroaching over the small area of bone that has remained exposed at the 12 o'clock position. Minimal slough accumulation. He continues to tolerate hyperbaric oxygen therapy well. 11/19/2021: I took [name] PCR culture last week. There was overgrowth of yeast. He is already taking suppressive doxycycline and Augmentin. I added fluconazole to his regimen. The wound is smaller again today. The tunnels continue to shorten. He continues to do well with hyperbaric oxygen therapy. 11/26/2021: For some reason, his foot has become macerated. The wound is narrower but about the same dimensions in its longitudinal aspect. The tunnels continue to shorten. He has some slough buildup on the wound as well as some heaped up senescent epithelium around the perimeter. 12/03/2021: No further  maceration of his foot has occurred.  The wound has contracted quite significantly from last week. The tunnel at 10:00 is closed. The tunnel at 12:00 is down to just [name] couple of millimeters. No other undermining is present. There is soft tissue coverage of the previously exposed bone. There is just [name] bit of slough and biofilm on the wound surface. 12/10/2021: The wound is looking good. It turns out the tunnel at 12:00 is only exposed when the patient dorsiflexes his foot. It is about 2 cm in depth when he does this; when his foot is in plantarflexion, the tunnel is closed. The bone that was visible at the 12:00 tunnel is completely covered with granulation tissue, but there does feel like some exposed bone deeper into the tunnel area. There is senescent skin heaped up around the periphery. Minimal slough on the wound surface. 12/16/2021: The wound dimensions are roughly the same. The surface has nice granulation tissue. The exposed bone at the 12:00 tunnel continues to be covered with more soft tissue. 7/14; patient's wound measures smaller today. Using the wound VAC with underlying collagen. He is also being treated with HBO for underlying osteomyelitis. He tells me he is on doxycycline and ampicillin follows with infectious disease next week 12/31/2021: The wound continues to contract. Unfortunately, the area where the track pad and tubing have been rubbing continues to look like it is applying friction. He says that the home health nurses that have been applying the [facility] have been putting gauze underneath the tubing, but nonetheless there is ongoing tissue breakdown at this site. Light slough accumulation on the wound surface. The tunnel continues to contract. He is tolerating HBO without difficulty. 01/07/2022: Bridging the wound VAC away from the ankle has resulted in significant improvement in the tissue at the apex of the wound. The tunnel is still present and is not all that much shorter, but the  overall wound surface is very robust and healthy looking. Minimal slough accumulation. No concern for acute infection. 01/21/2022: The wound continues to contract and has [name] robust granulation tissue surface. The tunnel has come in considerably and is down to about 1.4 cm. There is still bone exposed within the tunnel but the rest of it is well covered. There is some senescent epithelium at the wound margins and [name] little bit of slough on the surface. 01/28/2022: No significant change in the wound this week, but there has not been any reaccumulation of senescent epithelium or slough. The tunnel is perhaps [name] millimeter less in depth. He has been approved for Apligraf and we will apply this today. 02/04/2022: The wound has contracted somewhat and the tunnel has filled in completely. The wound surface is clean. He is here for Apligraf #2. 02/11/2022: The wound has contracted further and is now nearly flush with the surrounding skin surface. Light layer of slough. Apligraf #3 plan for today. 02/18/2022: There is [name] band of epithelium trying to cut across the superior portion of the wound. There is robust granulation tissue with just [name] light layer of slough and biofilm on the surface. There was [name] little bit of greenish drainage in the wound VAC but none appreciated on the site itself. He is here for Apligraf #4. 03/04/2022: The wound has contracted considerably. There is good granulation tissue on the surface. Minimal biofilm. He is here for Apligraf #5. 03/18/2022: The wound has epithelialized to the point that it has been divided into 2 areas. Both areas are smaller in total than at his previous visit. There is good granulation  tissue present with just [name] little bit of periwound eschar and surface slough. 10/[name]; left medial foot. The patient has completed treatment with hyperbaric oxygen for underlying osteomyelitis he has had [name] nice response in the wound. Noted today that he still had some greenish drainage.  Previously he has been treated with topical gentamicin but apparently that was stopped 2 weeks ago. Fortunately the wound is measuring smaller 10/20; wound looks better no hypergranulation minimal drainage. Granulation tissue looks healthy using gentamicin Hydrofera Blue under compression 04/08/2022: The wound continues to contract tremendously. The response to his treatment for hypertrophic granulation tissue has been quite remarkable. There are just [name] couple of open areas remaining. 04/18/2022: His wound is healed. READMISSION 07/04/2022: He returns to clinic with [name] new ulcer adjacent to where his previous large wound had been. He says that he was wearing boots and using [name] small padded dressing to protect the freshly healed ankle wound. Apparently the dressing rolled up and rubbed on his skin, causing [name] new wound. He contacted our office last week concerned about the appearance. There is [name] small oval wound on his left medial ankle. It is fairly clean with some periwound dry skin and eschar. 07/11/2022: The wound is about the same size this week. There is [name] layer of rubbery slough on the surface. It has [name] fairly strong odor. 07/18/2022: The wound measured larger this week due to small satellite areas opening in [name] fan pattern distal to the primary wound. He continues to accumulate slough on the wound surface. The culture that I took last week only grew out low levels of skin flora. 07/25/2022: The satellite areas that had opened last week have closed. The main wound is smaller with some slough accumulation. Patient History Information obtained from Patient. Family History Cancer - Father, Diabetes - Father,Mother,Paternal Grandparents, Heart Disease - Father, Hypertension - Father, [name], [name] ([id number]) [id number] Page 8 of 11 No family history of Hereditary Spherocytosis, Kidney Disease, Lung Disease, Seizures, Stroke, Thyroid Problems, Tuberculosis. Social  History Never smoker, Marital Status - Married, Alcohol Use - Rarely, Drug Use - No History, Caffeine Use - Daily. Medical History Eyes Patient has history of Cataracts - Removed 2008 Cardiovascular Patient has history of Coronary Artery Disease, Hypertension, Myocardial Infarction, Peripheral Arterial Disease Endocrine Patient has history of Type II Diabetes Musculoskeletal Patient has history of Osteomyelitis Neurologic Patient has history of Neuropathy Medical [name] Surgical History Notes nd Cardiovascular Hypercholesterolemia Abnormal EKG CABG X3 2019 Gastrointestinal GERD Musculoskeletal Diabetic foot ulcer Objective Constitutional Hypertensive, asymptomatic. no acute distress. Vitals Time Taken: 2:44 PM, Height: 74 in, Weight: 187 lbs, BMI: 24, Temperature: 97.6 F, Pulse: 75 bpm, Respiratory Rate: 16 breaths/min, Blood Pressure: 178/93 mmHg, Capillary Blood Glucose: 132 mg/dl. Respiratory Normal work of breathing on room air. General Notes: 07/25/2022: The satellite areas that had opened last week have closed. The main wound is smaller with some slough accumulation. Integumentary (Hair, Skin) Wound #2 status is Open. Original cause of wound was Pressure Injury. The date acquired was: 06/13/2022. The wound has been in treatment 3 weeks. The wound is located on the Left,Medial Ankle. The wound measures 2cm length x 0.9cm width x 0.1cm depth; 1.414cm^2 area and 0.141cm^3 volume. There is Fat Layer (Subcutaneous Tissue) exposed. There is no tunneling or undermining noted. There is [name] medium amount of serous drainage noted. The wound margin is distinct with the outline attached to the wound base. There is large (67-100%) granulation within the wound bed. There is [name] small (  1-33%) amount of necrotic tissue within the wound bed including Adherent Slough. The periwound skin appearance had no abnormalities noted for texture. The periwound skin appearance had no abnormalities noted for color.  The periwound skin appearance exhibited: Maceration. The periwound skin appearance did not exhibit: Dry/Scaly. Periwound temperature was noted as No Abnormality. Assessment Active Problems ICD-10 Non-pressure chronic ulcer of left ankle with fat layer exposed Peripheral vascular disease, unspecified Type 2 diabetes mellitus with other skin ulcer Procedures Wound #2 Pre-procedure diagnosis of Wound #2 is [name] Diabetic Wound/Ulcer of the Lower Extremity located on the Left,Medial Ankle .Severity of Tissue Pre Debridement is: Fat layer exposed. There was [name] Excisional Skin/Subcutaneous Tissue Debridement with [name] total area of 1.8 sq cm performed by Colin Maudlin, MD. With the following instrument(s): Curette to remove Non-Viable tissue/material. Material removed includes Subcutaneous Tissue and Slough and after achieving pain control using Lidocaine 4% T opical Solution. No specimens were taken. [name] time out was conducted at 14:55, prior to the start of the procedure. [name] Minimum amount of bleeding was controlled with Pressure. The procedure was tolerated well. Post Debridement Measurements: 2cm length x 0.9cm width x 0.1cm depth; 0.141cm^3 volume. Character of Wound/Ulcer Post Debridement is improved. Severity of Tissue Post Debridement is: Fat layer exposed. Post procedure Diagnosis Wound #2: Same as Pre-Procedure General Notes: scribed for Dr. Celine [name] by Colin Peals, RN. [name], [name] ([id number]) [id number] Page 9 of 11 Plan Follow-up Appointments: Return Appointment in 1 week. - Dr. Celine [name] - room 2 - Anesthetic: Wound #2 Left,Medial Ankle: (In clinic) Topical Lidocaine 4% applied to wound bed Bathing/ Shower/ Hygiene: May shower and wash wound with soap and water. Edema Control - Lymphedema / SCD / Other: Elevate legs to the level of the heart or above for 30 minutes daily and/or when sitting for 3-4 times [name] day throughout the day. Avoid standing for long  periods of time. The following medication(s) was prescribed: lidocaine topical 4 % cream cream topical was prescribed at facility WOUND #2: - Ankle Wound Laterality: Left, Medial Cleanser: Normal Saline 1 x Per Day/30 Days Discharge Instructions: Cleanse the wound with Normal Saline prior to applying [name] clean dressing using gauze sponges, not tissue or cotton balls. Peri-Wound Care: Sween Lotion (Moisturizing lotion) 1 x Per Day/30 Days Discharge Instructions: Apply moisturizing lotion as directed Topical: Mupirocin Ointment 1 x Per Day/30 Days Discharge Instructions: Apply Mupirocin (Bactroban) as instructed Prim Dressing: [place] 2x2 (in/in) (Dispense As Written) 1 x Per Day/30 Days ary Discharge Instructions: Apply to wound bed as instructed Secondary Dressing: ABD Pad, 5x9 (Generic) 1 x Per Day/30 Days Discharge Instructions: Apply over primary dressing as directed. Secondary Dressing: Optifoam Non-Adhesive Dressing, 4x4 in 1 x Per Day/30 Days Discharge Instructions: Apply over primary dressing as directed. Secured With: Transpore Surgical T ape, 2x10 (in/yd) 1 x Per Day/30 Days Discharge Instructions: Secure dressing with tape as directed. Com pression Wrap: Kerlix Roll 4.5x3.1 (in/yd) (Generic) 1 x Per Day/30 Days Discharge Instructions: Apply Kerlix and Coban compression as directed. Com pression Wrap: Coban Self-Adherent Wrap 4x5 (in/yd) (Generic) 1 x Per Day/30 Days Discharge Instructions: Apply over Kerlix as directed. 07/25/2022: The satellite areas that had opened last week have closed. The main wound is smaller with some slough accumulation. I used [name] curette to debride slough and nonviable subcutaneous tissue from the wound. We will continue topical mupirocin with silver alginate and [name] foam donut to protect the wound. Kerlix and Ace wrap. Follow-up in 1 week. Electronic  Signature(s) Signed: 07/25/2022 3:18:15 PM By: Colin Maudlin MD FACS Entered By: [name] on 07/25/2022 15:18:15 -------------------------------------------------------------------------------- HxROS Details Patient Name: Date of Service: NA [name]. 07/25/2022 2:45 PM Medical Record Number: [id number] Patient Account Number: [id number] Date of Birth/Sex: Treating RN: [birth year] [name] (67 y.o. M) Primary Care Provider: Cher [name] Other Clinician: Referring Provider: Treating Provider/Extender: [name] Weeks in Treatment: 3 Information Obtained From Patient Eyes Medical History: Positive for: Cataracts - Removed 2008 Cardiovascular Medical History: Positive for: Coronary Artery Disease; Hypertension; Myocardial Infarction; Peripheral Arterial Disease Past Medical History NotesNAYTHON, [name] ([id number]) [phone].pdf Page 10 of 11 Hypercholesterolemia Abnormal EKG CABG X3 2019 Gastrointestinal Medical History: Past Medical History Notes: GERD Endocrine Medical History: Positive for: Type II Diabetes Time with diabetes: 24 years Treated with: Insulin, Oral agents Blood sugar tested every day: Yes Tested : Musculoskeletal Medical History: Positive for: Osteomyelitis Past Medical History Notes: Diabetic foot ulcer Neurologic Medical History: Positive for: Neuropathy HBO Extended History Items Eyes: Cataracts Immunizations Pneumococcal Vaccine: Received Pneumococcal Vaccination: Yes Received Pneumococcal Vaccination On or After 60th Birthday: Yes Implantable Devices Yes Family and Social History Cancer: Yes - Father; Diabetes: Yes - Father,Mother,Paternal Grandparents; Heart Disease: Yes - Father; Hereditary Spherocytosis: No; Hypertension: Yes - Father; Kidney Disease: No; Lung Disease: No; Seizures: No; Stroke: No; Thyroid Problems: No; Tuberculosis: No; Never smoker; Marital Status - Married; Alcohol Use: Rarely; Drug Use: No History; Caffeine Use: Daily; Financial Concerns: No; Food, Clothing  or Shelter Needs: No; Support System Lacking: No; Transportation Concerns: No Electronic Signature(s) Signed: 07/25/2022 4:34:30 PM By: Colin Maudlin MD FACS Entered By: [name] on 07/25/2022 15:14:47 -------------------------------------------------------------------------------- SuperBill Details Patient Name: Date of Service: NA [name]. 07/25/2022 Medical Record Number: [id number] Patient Account Number: [id number] Date of Birth/Sex: Treating RN: [birth year] (67 y.o. M) Primary Care Provider: Cher [name] Other Clinician: Referring Provider: Treating Provider/Extender: [name] Weeks in Treatment: 3 Diagnosis Coding ICD-10 Codes Code Description [id number] Non-pressure chronic ulcer of left ankle with fat layer exposed Gish, Josuel [name] ([id number]) [phone].pdf Page 11 of 11 I73.9 Peripheral vascular disease, unspecified E11.622 Type 2 diabetes mellitus with other skin ulcer Facility Procedures : CPT4 Code: [id number] Description: [id number] - DEB SUBQ TISSUE 20 SQ CM/< ICD-10 Diagnosis Description [id number] Non-pressure chronic ulcer of left ankle with fat layer exposed Modifier: Quantity: 1 Physician Procedures : CPT4 Code Description Modifier [id number] - WC PHYS LEVEL 4 - EST PT 25 ICD-10 Diagnosis Description [id number] Non-pressure chronic ulcer of left ankle with fat layer exposed I73.9 Peripheral vascular disease, unspecified E11.622 Type 2 diabetes  mellitus with other skin ulcer Quantity: 1 : [id number] - WC PHYS SUBQ TISS 20 SQ CM ICD-10 Diagnosis Description [id number] Non-pressure chronic ulcer of left ankle with fat layer exposed Quantity: 1 Electronic Signature(s) Signed: 07/25/2022 3:18:35 PM By: Colin Maudlin MD FACS Entered By: [name] on 07/25/2022 15:18:35

## 2022-07-26 DIAGNOSIS — E11621 Type 2 diabetes mellitus with foot ulcer: Secondary | ICD-10-CM | POA: Diagnosis not present

## 2022-07-29 DIAGNOSIS — H26492 Other secondary cataract, left eye: Secondary | ICD-10-CM | POA: Diagnosis not present

## 2022-08-01 ENCOUNTER — Encounter (HOSPITAL_BASED_OUTPATIENT_CLINIC_OR_DEPARTMENT_OTHER): Payer: BC Managed Care – PPO | Admitting: General Surgery

## 2022-08-01 DIAGNOSIS — L97322 Non-pressure chronic ulcer of left ankle with fat layer exposed: Secondary | ICD-10-CM | POA: Diagnosis not present

## 2022-08-01 DIAGNOSIS — K219 Gastro-esophageal reflux disease without esophagitis: Secondary | ICD-10-CM | POA: Diagnosis not present

## 2022-08-01 DIAGNOSIS — Z951 Presence of aortocoronary bypass graft: Secondary | ICD-10-CM | POA: Diagnosis not present

## 2022-08-01 DIAGNOSIS — E11622 Type 2 diabetes mellitus with other skin ulcer: Secondary | ICD-10-CM | POA: Diagnosis not present

## 2022-08-01 DIAGNOSIS — I2510 Atherosclerotic heart disease of native coronary artery without angina pectoris: Secondary | ICD-10-CM | POA: Diagnosis not present

## 2022-08-01 DIAGNOSIS — E7800 Pure hypercholesterolemia, unspecified: Secondary | ICD-10-CM | POA: Diagnosis not present

## 2022-08-01 DIAGNOSIS — E1151 Type 2 diabetes mellitus with diabetic peripheral angiopathy without gangrene: Secondary | ICD-10-CM | POA: Diagnosis not present

## 2022-08-01 DIAGNOSIS — Z833 Family history of diabetes mellitus: Secondary | ICD-10-CM | POA: Diagnosis not present

## 2022-08-01 DIAGNOSIS — I10 Essential (primary) hypertension: Secondary | ICD-10-CM | POA: Diagnosis not present

## 2022-08-01 DIAGNOSIS — E1140 Type 2 diabetes mellitus with diabetic neuropathy, unspecified: Secondary | ICD-10-CM | POA: Diagnosis not present

## 2022-08-02 DIAGNOSIS — E1140 Type 2 diabetes mellitus with diabetic neuropathy, unspecified: Secondary | ICD-10-CM | POA: Diagnosis not present

## 2022-08-02 DIAGNOSIS — E119 Type 2 diabetes mellitus without complications: Secondary | ICD-10-CM | POA: Diagnosis not present

## 2022-08-02 DIAGNOSIS — M159 Polyosteoarthritis, unspecified: Secondary | ICD-10-CM | POA: Diagnosis not present

## 2022-08-02 DIAGNOSIS — I10 Essential (primary) hypertension: Secondary | ICD-10-CM | POA: Diagnosis not present

## 2022-08-02 DIAGNOSIS — E7800 Pure hypercholesterolemia, unspecified: Secondary | ICD-10-CM | POA: Diagnosis not present

## 2022-08-02 NOTE — Progress Notes (Signed)
[name] ([id number]) [id number] Page 1 of 11 Visit Report for 08/01/2022 Chief Complaint Document Details Patient Name: Date of Service: NA [name] 08/01/2022 2:45 PM Medical Record Number: [id number] Patient Account Number: [id number] Date of Birth/Sex: Treating RN: 1956/05/03 (67 y.o. M) Primary Care Provider: [name] Other Clinician: Referring Provider: Treating Provider/Extender: [name] Weeks in Treatment: 4 Information Obtained from: Patient Chief Complaint Patients presents for treatment of an open diabetic ulcer with exposed bone and osteomyelitis 07/04/2022: Diabetic ankle ulcer Electronic Signature(s) Signed: 08/01/2022 4:21:02 PM By: [name] MD FACS Entered By: [name] on 08/01/2022 16:21:02 -------------------------------------------------------------------------------- Debridement Details Patient Name: Date of Service: NA Colin Philips [name]. 08/01/2022 2:45 PM Medical Record Number: [id number] Patient Account Number: [id number] Date of Birth/Sex: Treating RN: [birth year] (67 y.o. [name] Primary Care Provider: [name] Other Clinician: Referring Provider: Treating Provider/Extender: [name] Weeks in Treatment: 4 Debridement Performed for Assessment: Wound #2 Left,Medial Ankle Performed By: Physician [name], MD Debridement Type: Debridement Severity of Tissue Pre Debridement: Fat layer exposed Level of Consciousness (Pre-procedure): Awake and Alert Pre-procedure Verification/Time Out Yes - 14:55 Taken: Start Time: 14:55 Pain Control: Lidocaine 4% T opical Solution T Area Debrided (L x W): otal 1.4 (cm) x 0.6 (cm) = 0.84 (cm) Tissue and other material debrided: Non-Viable, Slough, Slough Level: Non-Viable Tissue Debridement Description: Selective/Open Wound Instrument: Curette Bleeding: Minimum Hemostasis Achieved: Pressure Response to  Treatment: Procedure was tolerated well Level of Consciousness (Post- Awake and Alert procedure): Post Debridement Measurements of Total Wound Length: (cm) 1.4 Width: (cm) 0.6 Depth: (cm) 0.1 Volume: (cm) 0.066 Character of Wound/Ulcer Post Debridement: Improved Severity of Tissue Post Debridement: Fat layer exposed Footman, Marco [name] ([id number] Page 2 of 11 Post Procedure Diagnosis Same as Pre-procedure Notes scribed for Dr. Celine Ahr by Adline Peals, RN Electronic Signature(s) Signed: 08/01/2022 4:41:15 PM By: Adline Peals Signed: 08/01/2022 5:01:55 PM By: [name] MD FACS Entered By: Adline Peals on 08/01/2022 14:56:31 -------------------------------------------------------------------------------- HPI Details Patient Name: Date of Service: NA Colin [name] 08/01/2022 2:45 PM Medical Record Number: [id number] Patient Account Number: [id number] Date of Birth/Sex: Treating RN: [birth year] (67 y.o. M) Primary Care Provider: [name] Other Clinician: Referring Provider: Treating Provider/Extender: [name] Weeks in Treatment: 4 History of Present Illness HPI Description: ADMISSION 08/25/2021 This is [name] 67 year old man who initially presented to his primary care provider in September 2022 with pain in his left foot. He was sent for an x-ray and while the x-ray was being performed, the tech pointed out [name] wound on his foot that the patient was not aware existed. He does have type 2 diabetes with significant neuropathy. His diabetes is suboptimally controlled with his most recent A1c being 8.5. He also has [name] history of coronary artery disease status post three- vessel CABG. he was initially seen by orthopedics, but they referred him to [facility]. He has undergone at least 7 operations/debridements and several applications of skin substitute under the care of podiatry. He has been in [name]  wound VAC for much of this time. His most recent procedure was July 28, 2021. [name] was biopsied and was found to be consistent with osteomyelitis. Culture also returned positive for corynebacterium. He was seen on August 16, 2021 by infectious disease. [name] PICC line has been placed and he will be receiving [name] 6-week course of IV daptomycin and cefepime. In  October 2022, he underwent lower extremity vascular studies. Results are copied here: Right: Resting right ankle-brachial index is within normal range. No evidence of significant right lower extremity arterial disease. The right toe-brachial index is abnormal. Left: Resting left ankle-brachial index indicates mild left lower extremity arterial disease. The left toe-brachial index is abnormal. He has not been seen by vascular surgery despite these findings. He presented to clinic today in [name] and is using [name] to offload. Wound VAC was in place. Once this was removed, [name] large ulcer was identified on the left midfoot/ankle. Bone is frankly exposed. There is no malodorous or purulent drainage. There is some granulation tissue over the central portion of the exposed bone. There is [name] tunnel that extends posteriorly for roughly 10 cm. It has been discussed with him by multiple providers that he is at very high risk of losing his lower leg because of this wound. He is extremely eager to avoid this outcome and is here today to review his options as well as receive ongoing wound care. 09/03/2021: Here for reevaluation of his wound. There does not appear to have been any substantial improvement overall since our last visit. He has been in [name] with white foam overlying the exposed bone. We are working on getting him approved for hyperbaric oxygen therapy. 09/10/2021: We are in the process of getting him cleared to begin hyperbaric oxygen therapy. He still needs to obtain [name] chest x-ray. Although the wound measurements  are roughly the same, I think the overall appearance of the wound is better. The exposed bone has [name] bit more granulation tissue covering it. He has not received [name] vascular surgery appointment to reevaluate his flow to the wound. 09/17/2021: He has been approved for hyperbaric oxygen therapy and completed his chest x-ray, which I reviewed and it appears normal. The tunnels at the 12 and 10:00 positions are smaller. There is more granulation tissue covering the exposed bone and the undermining has decreased. He still has not received [name] vascular surgery appointment. 09/24/2021: He initiated hyperbaric oxygen therapy this week and is tolerating it well. He has an appointment with vascular surgery coming up on May 16. The granulation tissue is covering more of the exposed bone and both tunnels are [name] bit smaller. 10/01/2021: He continues to tolerate hyperbaric oxygen therapy. He saw infectious disease and they are planning to pull his PICC line. He has been initiated on oral antibiotics (doxycycline and Augmentin). The wound looks about the same but the tunnels are [name] little bit smaller. The skin seems to be contracting somewhat around the exposed bone. 10/08/2021: The wound is still about the same size, but the tunnels continue to come in and the skin is contracting around the exposed bone. He continues to have some accumulation of necrotic material in the inferoposterior aspect of the wound as well as accumulation at the 12:00 tunnel area. 10/15/2021: The wound is smaller today. The tunnels continue to come in. There is less necrotic tissue present. He does have some periwound maceration. 10/22/2021: The wound is about the same size. There is [name] little bit less undermining at the distal portion. The exposed bone is dark and I am not sure if this is staining from silver nitrate or his VAC sponge or if it represents necrosis. The tunnels are shallower but he does have some serous drainage coming from the [facility] [name] ([id number]) [phone].pdf Page 3 of 11 10:00 tunnel. He continues to tolerate hyperbaric  oxygen therapy well. 10/29/2021: The undermining continues to improve. The tunnels are about the same. He has good granulation tissue overlying the majority of the exposed bone. It does appear that perhaps the tubing from his wound VAC has been eroding the skin at the 12 clock position. He continues to accumulate senescent epithelium around the borders of the wound. 11/05/2021: The undermining is almost completely resolved. The tunnels have contracted fairly significantly. No significant slough or debris accumulation. There is still senescent epithelium accumulation around the borders of the wound. He has been tolerating hyperbaric oxygen therapy well. 11/12/2021: Despite the measurements of the wound being about the same, the wound has changed in its shape and overall, I think it is improved. The undermining has resolved and the tunnels continue to shorten. There is good granulation tissue encroaching over the small area of bone that has remained exposed at the 12 o'clock position. Minimal slough accumulation. He continues to tolerate hyperbaric oxygen therapy well. 11/19/2021: I took [name] PCR culture last week. There was overgrowth of yeast. He is already taking suppressive doxycycline and Augmentin. I added fluconazole to his regimen. The wound is smaller again today. The tunnels continue to shorten. He continues to do well with hyperbaric oxygen therapy. 11/26/2021: For some reason, his foot has become macerated. The wound is narrower but about the same dimensions in its longitudinal aspect. The tunnels continue to shorten. He has some slough buildup on the wound as well as some heaped up senescent epithelium around the perimeter. 12/03/2021: No further maceration of his foot has occurred. The wound has contracted quite significantly from last week. The tunnel at 10:00 is closed.  The tunnel at 12:00 is down to just [name] couple of millimeters. No other undermining is present. There is soft tissue coverage of the previously exposed bone. There is just [name] bit of slough and biofilm on the wound surface. 12/10/2021: The wound is looking good. It turns out the tunnel at 12:00 is only exposed when the patient dorsiflexes his foot. It is about 2 cm in depth when he does this; when his foot is in plantarflexion, the tunnel is closed. The bone that was visible at the 12:00 tunnel is completely covered with granulation tissue, but there does feel like some exposed bone deeper into the tunnel area. There is senescent skin heaped up around the periphery. Minimal slough on the wound surface. 12/16/2021: The wound dimensions are roughly the same. The surface has nice granulation tissue. The exposed bone at the 12:00 tunnel continues to be covered with more soft tissue. 7/14; patient's wound measures smaller today. Using the wound VAC with underlying collagen. He is also being treated with HBO for underlying osteomyelitis. He tells me he is on doxycycline and ampicillin follows with infectious disease next week 12/31/2021: The wound continues to contract. Unfortunately, the area where the track pad and tubing have been rubbing continues to look like it is applying friction. He says that the home health nurses that have been applying the [facility] have been putting gauze underneath the tubing, but nonetheless there is ongoing tissue breakdown at this site. Light slough accumulation on the wound surface. The tunnel continues to contract. He is tolerating HBO without difficulty. 01/07/2022: Bridging the wound VAC away from the ankle has resulted in significant improvement in the tissue at the apex of the wound. The tunnel is still present and is not all that much shorter, but the overall wound surface is very robust and healthy looking. Minimal slough accumulation.  No concern for acute infection. 01/21/2022:  The wound continues to contract and has [name] robust granulation tissue surface. The tunnel has come in considerably and is down to about 1.4 cm. There is still bone exposed within the tunnel but the rest of it is well covered. There is some senescent epithelium at the wound margins and [name] little bit of slough on the surface. 01/28/2022: No significant change in the wound this week, but there has not been any reaccumulation of senescent epithelium or slough. The tunnel is perhaps [name] millimeter less in depth. He has been approved for Apligraf and we will apply this today. 02/04/2022: The wound has contracted somewhat and the tunnel has filled in completely. The wound surface is clean. He is here for Apligraf #2. 02/11/2022: The wound has contracted further and is now nearly flush with the surrounding skin surface. Light layer of slough. Apligraf #3 plan for today. 02/18/2022: There is [name] band of epithelium trying to cut across the superior portion of the wound. There is robust granulation tissue with just [name] light layer of slough and biofilm on the surface. There was [name] little bit of greenish drainage in the wound VAC but none appreciated on the site itself. He is here for Apligraf #4. 03/04/2022: The wound has contracted considerably. There is good granulation tissue on the surface. Minimal biofilm. He is here for Apligraf #5. 03/18/2022: The wound has epithelialized to the point that it has been divided into 2 areas. Both areas are smaller in total than at his previous visit. There is good granulation tissue present with just [name] little bit of periwound eschar and surface slough. 10/13; left medial foot. The patient has completed treatment with hyperbaric oxygen for underlying osteomyelitis he has had [name] nice response in the wound. Noted today that he still had some greenish drainage. Previously he has been treated with topical gentamicin but apparently that was stopped 2 weeks ago. Fortunately the wound is measuring  smaller 10/20; wound looks better no hypergranulation minimal drainage. Granulation tissue looks healthy using gentamicin Hydrofera Blue under compression 04/08/2022: The wound continues to contract tremendously. The response to his treatment for hypertrophic granulation tissue has been quite remarkable. There are just [name] couple of open areas remaining. 04/18/2022: His wound is healed. READMISSION 07/04/2022: He returns to clinic with [name] new ulcer adjacent to where his previous large wound had been. He says that he was wearing boots and using [name] small padded dressing to protect the freshly healed ankle wound. Apparently the dressing rolled up and rubbed on his skin, causing [name] new wound. He contacted our office last week concerned about the appearance. There is [name] small oval wound on his left medial ankle. It is fairly clean with some periwound dry skin and eschar. 07/11/2022: The wound is about the same size this week. There is [name] layer of rubbery slough on the surface. It has [name] fairly strong odor. 07/18/2022: The wound measured larger this week due to small satellite areas opening in [name] fan pattern distal to the primary wound. He continues to accumulate slough on the wound surface. The culture that I took last week only grew out low levels of skin flora. 07/25/2022: The satellite areas that had opened last week have closed. The main wound is smaller with some slough accumulation. 08/01/2022: The wound continues to contract. There is [name] layer of slough on the surface. Electronic Signature(s) Signed: 08/01/2022 4:21:26 PM By: [name] MD FACS Entered By: [name] on 08/01/2022 16:21:26  KOHLSON, STEFFENSMEIER [name] ([id number]) [id number] Page 4 of 11 -------------------------------------------------------------------------------- Physical Exam Details Patient Name: Date of Service: NA Colin Philips [name]. 08/01/2022 2:45 PM Medical Record Number: [id number] Patient Account Number:  [id number] Date of Birth/Sex: Treating RN: 05-May-1956 (67 y.o. M) Primary Care Provider: [name] Other Clinician: Referring Provider: Treating Provider/Extender: [name] Weeks in Treatment: 4 Constitutional Hypertensive, asymptomatic. . . . no acute distress. Respiratory Normal work of breathing on room air. Notes 08/01/2022: The wound continues to contract. There is [name] layer of slough on the surface. Electronic Signature(s) Signed: 08/01/2022 4:22:33 PM By: [name] MD FACS Entered By: [name] on 08/01/2022 16:22:32 -------------------------------------------------------------------------------- Physician Orders Details Patient Name: Date of Service: NA Colin Philips [name]. 08/01/2022 2:45 PM Medical Record Number: [id number] Patient Account Number: [id number] Date of Birth/Sex: Treating RN: 1956/05/26 (67 y.o. [name] Primary Care Provider: [name] Other Clinician: Referring Provider: Treating Provider/Extender: [name] in Treatment: 4 Verbal / Phone Orders: No Diagnosis Coding ICD-10 Coding Code Description [phone] Non-pressure chronic ulcer of left ankle with fat layer exposed I73.9 Peripheral vascular disease, unspecified E11.622 Type 2 diabetes mellitus with other skin ulcer Follow-up Appointments ppointment in 1 week. - Dr. Celine Ahr - room 2 - Return [name] Anesthetic Wound #2 Left,Medial Ankle (In clinic) Topical Lidocaine 4% applied to wound bed Bathing/ Shower/ Hygiene May shower and wash wound with soap and water. Edema Control - Lymphedema / SCD / Other Elevate legs to the level of the heart or above for 30 minutes daily and/or when sitting for 3-4 times [name] day throughout the day. Avoid standing for long periods of time. Wound Treatment Wound #2 - Ankle Wound Laterality: Left, Medial Cleanser: Normal Saline 1 x Per Day/30 Days RAEMOND, MEINECKE [name] ([id number])  [phone].pdf Page 5 of 11 Discharge Instructions: Cleanse the wound with Normal Saline prior to applying [name] clean dressing using gauze sponges, not tissue or cotton balls. Peri-Wound Care: Sween Lotion (Moisturizing lotion) 1 x Per Day/30 Days Discharge Instructions: Apply moisturizing lotion as directed Topical: Mupirocin Ointment 1 x Per Day/30 Days Discharge Instructions: Apply Mupirocin (Bactroban) as instructed Prim Dressing: [place] 2x2 (in/in) (Dispense As Written) 1 x Per Day/30 Days ary Discharge Instructions: Apply to wound bed as instructed Secondary Dressing: ABD Pad, 5x9 (Generic) 1 x Per Day/30 Days Discharge Instructions: Apply over primary dressing as directed. Secondary Dressing: Optifoam Non-Adhesive Dressing, 4x4 in 1 x Per Day/30 Days Discharge Instructions: Apply over primary dressing as directed. Secured With: Transpore Surgical Tape, 2x10 (in/yd) 1 x Per Day/30 Days Discharge Instructions: Secure dressing with tape as directed. Compression Wrap: Kerlix Roll 4.5x3.1 (in/yd) (Generic) 1 x Per Day/30 Days Discharge Instructions: Apply Kerlix and Coban compression as directed. Compression Wrap: Coban Self-Adherent Wrap 4x5 (in/yd) (Generic) 1 x Per Day/30 Days Discharge Instructions: Apply over Kerlix as directed. Patient Medications llergies: codeine [name] Notifications Medication Indication Start End 08/01/2022 lidocaine DOSE topical 4 % cream - cream topical Electronic Signature(s) Signed: 08/01/2022 5:01:55 PM By: [name] MD FACS Entered By: [name] on 08/01/2022 16:22:57 -------------------------------------------------------------------------------- Problem List Details Patient Name: Date of Service: NA Colin Philips [name]. 08/01/2022 2:45 PM Medical Record Number: [id number] Patient Account Number: [id number] Date of Birth/Sex: Treating RN: 07-16-1955 (67 y.o. M) Primary Care Provider: [name] Other  Clinician: Referring Provider: Treating Provider/Extender: [name] in Treatment: 4 Active Problems ICD-10 Encounter Code Description Active Date MDM Diagnosis L97.322 Non-pressure  chronic ulcer of left ankle with fat layer exposed 07/04/2022 No Yes I73.9 Peripheral vascular disease, unspecified 07/04/2022 No Yes E11.622 Type 2 diabetes mellitus with other skin ulcer 07/04/2022 No Yes NATHYN, DONOHOE [name] ([id number]) [phone].pdf Page 6 of 11 Inactive Problems Resolved Problems Electronic Signature(s) Signed: 08/01/2022 4:20:47 PM By: [name] MD FACS Entered By: [name] on 08/01/2022 16:20:47 -------------------------------------------------------------------------------- Progress Note Details Patient Name: Date of Service: NA Colin [name] 08/01/2022 2:45 PM Medical Record Number: [id number] Patient Account Number: [id number] Date of Birth/Sex: Treating RN: [birth year] (67 y.o. M) Primary Care Provider: [name] Other Clinician: Referring Provider: Treating Provider/Extender: [name] Weeks in Treatment: 4 Subjective Chief Complaint Information obtained from Patient Patients presents for treatment of an open diabetic ulcer with exposed bone and osteomyelitis 07/04/2022: Diabetic ankle ulcer History of Present Illness (HPI) ADMISSION 08/25/2021 This is [name] 67 year old man who initially presented to his primary care provider in September 2022 with pain in his left foot. He was sent for an x-ray and while the x-ray was being performed, the tech pointed out [name] wound on his foot that the patient was not aware existed. He does have type 2 diabetes with significant neuropathy. His diabetes is suboptimally controlled with his most recent A1c being 8.5. He also has [name] history of coronary artery disease status post three- vessel CABG. he was initially seen by orthopedics, but they referred him to [facility]. He has undergone at least 7 operations/debridements and several applications of skin substitute under the care of podiatry. He has been in [name] for much of this time. His most recent procedure was July 28, 2021. [name] was biopsied and was found to be consistent with osteomyelitis. Culture also returned positive for corynebacterium. He was seen on August 16, 2021 by infectious disease. [name] PICC line has been placed and he will be receiving [name] 6-week course of IV daptomycin and cefepime. In October 2022, he underwent lower extremity vascular studies. Results are copied here: Right: Resting right ankle-brachial index is within normal range. No evidence of significant right lower extremity arterial disease. The right toe-brachial index is abnormal. Left: Resting left ankle-brachial index indicates mild left lower extremity arterial disease. The left toe-brachial index is abnormal. He has not been seen by vascular surgery despite these findings. He presented to clinic today in [name] and is using [name] to offload. Wound VAC was in place. Once this was removed, [name] large ulcer was identified on the left midfoot/ankle. Bone is frankly exposed. There is no malodorous or purulent drainage. There is some granulation tissue over the central portion of the exposed bone. There is [name] tunnel that extends posteriorly for roughly 10 cm. It has been discussed with him by multiple providers that he is at very high risk of losing his lower leg because of this wound. He is extremely eager to avoid this outcome and is here today to review his options as well as receive ongoing wound care. 09/03/2021: Here for reevaluation of his wound. There does not appear to have been any substantial improvement overall since our last visit. He has been in [name] with white foam overlying the exposed bone. We are working on getting him approved for hyperbaric oxygen  therapy. 09/10/2021: We are in the process of getting him cleared to begin hyperbaric oxygen therapy. He still needs to obtain [name] chest x-ray. Although the wound measurements are  roughly the same, I think the overall appearance of the wound is better. The exposed bone has [name] bit more granulation tissue covering it. He has not received [name] vascular surgery appointment to reevaluate his flow to the wound. 09/17/2021: He has been approved for hyperbaric oxygen therapy and completed his chest x-ray, which I reviewed and it appears normal. The tunnels at the 12 and 10:00 positions are smaller. There is more granulation tissue covering the exposed bone and the undermining has decreased. He still has not received [name] vascular surgery appointment. 09/24/2021: He initiated hyperbaric oxygen therapy this week and is tolerating it well. He has an appointment with vascular surgery coming up on May 16. The granulation tissue is covering more of the exposed bone and both tunnels are [name] bit smaller. 10/01/2021: He continues to tolerate hyperbaric oxygen therapy. He saw infectious disease and they are planning to pull his PICC line. He has been initiated on oral antibiotics (doxycycline and Augmentin). The wound looks about the same but the tunnels are [name] little bit smaller. The skin seems to be contracting somewhat around the exposed bone. 10/08/2021: The wound is still about the same size, but the tunnels continue to come in and the skin is contracting around the exposed bone. He continues to have some accumulation of necrotic material in the inferoposterior aspect of the wound as well as accumulation at the 12:00 tunnel area. 10/15/2021: The wound is smaller today. The tunnels continue to come in. There is less necrotic tissue present. He does have some periwound maceration. 10/22/2021: The wound is about the same size. There is [name] little bit less undermining at the distal portion. The exposed bone is dark and I am not sure if this  is AKILAN, NOTO [name] ([id number]) [id number] Page 7 of 11 staining from silver nitrate or his VAC sponge or if it represents necrosis. The tunnels are shallower but he does have some serous drainage coming from the 10:00 tunnel. He continues to tolerate hyperbaric oxygen therapy well. 10/29/2021: The undermining continues to improve. The tunnels are about the same. He has good granulation tissue overlying the majority of the exposed bone. It does appear that perhaps the tubing from his wound VAC has been eroding the skin at the 12 clock position. He continues to accumulate senescent epithelium around the borders of the wound. 11/05/2021: The undermining is almost completely resolved. The tunnels have contracted fairly significantly. No significant slough or debris accumulation. There is still senescent epithelium accumulation around the borders of the wound. He has been tolerating hyperbaric oxygen therapy well. 11/12/2021: Despite the measurements of the wound being about the same, the wound has changed in its shape and overall, I think it is improved. The undermining has resolved and the tunnels continue to shorten. There is good granulation tissue encroaching over the small area of bone that has remained exposed at the 12 o'clock position. Minimal slough accumulation. He continues to tolerate hyperbaric oxygen therapy well. 11/19/2021: I took [name] PCR culture last week. There was overgrowth of yeast. He is already taking suppressive doxycycline and Augmentin. I added fluconazole to his regimen. The wound is smaller again today. The tunnels continue to shorten. He continues to do well with hyperbaric oxygen therapy. 11/26/2021: For some reason, his foot has become macerated. The wound is narrower but about the same dimensions in its longitudinal aspect. The tunnels continue to shorten. He has some slough buildup on the wound as well as some heaped up senescent epithelium around the  perimeter. 12/03/2021: No further maceration of his foot has occurred. The wound has contracted quite significantly from last week. The tunnel at 10:00 is closed. The tunnel at 12:00 is down to just [name] couple of millimeters. No other undermining is present. There is soft tissue coverage of the previously exposed bone. There is just [name] bit of slough and biofilm on the wound surface. 12/10/2021: The wound is looking good. It turns out the tunnel at 12:00 is only exposed when the patient dorsiflexes his foot. It is about 2 cm in depth when he does this; when his foot is in plantarflexion, the tunnel is closed. The bone that was visible at the 12:00 tunnel is completely covered with granulation tissue, but there does feel like some exposed bone deeper into the tunnel area. There is senescent skin heaped up around the periphery. Minimal slough on the wound surface. 12/16/2021: The wound dimensions are roughly the same. The surface has nice granulation tissue. The exposed bone at the 12:00 tunnel continues to be covered with more soft tissue. 7/14; patient's wound measures smaller today. Using the wound VAC with underlying collagen. He is also being treated with HBO for underlying osteomyelitis. He tells me he is on doxycycline and ampicillin follows with infectious disease next week 12/31/2021: The wound continues to contract. Unfortunately, the area where the track pad and tubing have been rubbing continues to look like it is applying friction. He says that the home health nurses that have been applying the [facility] have been putting gauze underneath the tubing, but nonetheless there is ongoing tissue breakdown at this site. Light slough accumulation on the wound surface. The tunnel continues to contract. He is tolerating HBO without difficulty. 01/07/2022: Bridging the wound VAC away from the ankle has resulted in significant improvement in the tissue at the apex of the wound. The tunnel is still present and is not  all that much shorter, but the overall wound surface is very robust and healthy looking. Minimal slough accumulation. No concern for acute infection. 01/21/2022: The wound continues to contract and has [name] robust granulation tissue surface. The tunnel has come in considerably and is down to about 1.4 cm. There is still bone exposed within the tunnel but the rest of it is well covered. There is some senescent epithelium at the wound margins and [name] little bit of slough on the surface. 01/28/2022: No significant change in the wound this week, but there has not been any reaccumulation of senescent epithelium or slough. The tunnel is perhaps [name] millimeter less in depth. He has been approved for Apligraf and we will apply this today. 02/04/2022: The wound has contracted somewhat and the tunnel has filled in completely. The wound surface is clean. He is here for Apligraf #2. 02/11/2022: The wound has contracted further and is now nearly flush with the surrounding skin surface. Light layer of slough. Apligraf #3 plan for today. 02/18/2022: There is [name] band of epithelium trying to cut across the superior portion of the wound. There is robust granulation tissue with just [name] light layer of slough and biofilm on the surface. There was [name] little bit of greenish drainage in the wound VAC but none appreciated on the site itself. He is here for Apligraf #4. 03/04/2022: The wound has contracted considerably. There is good granulation tissue on the surface. Minimal biofilm. He is here for Apligraf #5. 03/18/2022: The wound has epithelialized to the point that it has been divided into 2 areas. Both areas are smaller in  total than at his previous visit. There is good granulation tissue present with just [name] little bit of periwound eschar and surface slough. 10/13; left medial foot. The patient has completed treatment with hyperbaric oxygen for underlying osteomyelitis he has had [name] nice response in the wound. Noted today that he still had  some greenish drainage. Previously he has been treated with topical gentamicin but apparently that was stopped 2 weeks ago. Fortunately the wound is measuring smaller 10/20; wound looks better no hypergranulation minimal drainage. Granulation tissue looks healthy using gentamicin Hydrofera Blue under compression 04/08/2022: The wound continues to contract tremendously. The response to his treatment for hypertrophic granulation tissue has been quite remarkable. There are just [name] couple of open areas remaining. 04/18/2022: His wound is healed. READMISSION 07/04/2022: He returns to clinic with [name] new ulcer adjacent to where his previous large wound had been. He says that he was wearing boots and using [name] small padded dressing to protect the freshly healed ankle wound. Apparently the dressing rolled up and rubbed on his skin, causing [name] new wound. He contacted our office last week concerned about the appearance. There is [name] small oval wound on his left medial ankle. It is fairly clean with some periwound dry skin and eschar. 07/11/2022: The wound is about the same size this week. There is [name] layer of rubbery slough on the surface. It has [name] fairly strong odor. 07/18/2022: The wound measured larger this week due to small satellite areas opening in [name] fan pattern distal to the primary wound. He continues to accumulate slough on the wound surface. The culture that I took last week only grew out low levels of skin flora. 07/25/2022: The satellite areas that had opened last week have closed. The main wound is smaller with some slough accumulation. 08/01/2022: The wound continues to contract. There is [name] layer of slough on the surface. Patient History Information obtained from Patient. CHEE, MEASEL [name] ([id number]) [id number] Page 8 of 11 Family History Cancer - Father, Diabetes - Father,Mother,Paternal Grandparents, Heart Disease - Father, Hypertension - Father, No family history of  Hereditary Spherocytosis, Kidney Disease, Lung Disease, Seizures, Stroke, Thyroid Problems, Tuberculosis. Social History Never smoker, Marital Status - Married, Alcohol Use - Rarely, Drug Use - No History, Caffeine Use - Daily. Medical History Eyes Patient has history of Cataracts - Removed 2008 Cardiovascular Patient has history of Coronary Artery Disease, Hypertension, Myocardial Infarction, Peripheral Arterial Disease Endocrine Patient has history of Type II Diabetes Musculoskeletal Patient has history of Osteomyelitis Neurologic Patient has history of Neuropathy Medical [name] Surgical History Notes nd Cardiovascular Hypercholesterolemia Abnormal EKG CABG X3 2019 Gastrointestinal GERD Musculoskeletal Diabetic foot ulcer Objective Constitutional Hypertensive, asymptomatic. no acute distress. Vitals Time Taken: 2:45 PM, Height: 74 in, Weight: 187 lbs, BMI: 24, Temperature: 97.7 F, Pulse: 96 bpm, Respiratory Rate: 16 breaths/min, Blood Pressure: 165/88 mmHg, Capillary Blood Glucose: 182 mg/dl. Respiratory Normal work of breathing on room air. General Notes: 08/01/2022: The wound continues to contract. There is [name] layer of slough on the surface. Integumentary (Hair, Skin) Wound #2 status is Open. Original cause of wound was Pressure Injury. The date acquired was: 06/13/2022. The wound has been in treatment 4 weeks. The wound is located on the Left,Medial Ankle. The wound measures 1.4cm length x 0.6cm width x 0.1cm depth; 0.66cm^2 area and 0.066cm^3 volume. There is Fat Layer (Subcutaneous Tissue) exposed. There is no tunneling or undermining noted. There is [name] medium amount of serous drainage noted. The wound margin is distinct with  the outline attached to the wound base. There is large (67-100%) red, pink granulation within the wound bed. There is [name] small (1-33%) amount of necrotic tissue within the wound bed including Adherent Slough. The periwound skin appearance had no abnormalities  noted for texture. The periwound skin appearance had no abnormalities noted for color. The periwound skin appearance exhibited: Maceration. The periwound skin appearance did not exhibit: Dry/Scaly. Periwound temperature was noted as No Abnormality. Assessment Active Problems ICD-10 Non-pressure chronic ulcer of left ankle with fat layer exposed Peripheral vascular disease, unspecified Type 2 diabetes mellitus with other skin ulcer Procedures Wound #2 Pre-procedure diagnosis of Wound #2 is [name] Diabetic Wound/Ulcer of the Lower Extremity located on the Left,Medial Ankle .Severity of Tissue Pre Debridement is: Fat layer exposed. There was [name] Selective/Open Wound Non-Viable Tissue Debridement with [name] total area of 0.84 sq cm performed by [name], MD. With the following instrument(s): Curette to remove Non-Viable tissue/material. Material removed includes [facility] after achieving pain control using Lidocaine 4% T opical Solution. No specimens were taken. [name] time out was conducted at 14:55, prior to the start of the procedure. [name] Minimum amount of bleeding was controlled with Pressure. The procedure was tolerated well. Post Debridement Measurements: 1.4cm length x 0.6cm width x 0.1cm depth; 0.066cm^3 volume. Character of Wound/Ulcer Post Debridement is improved. Severity of Tissue Post Debridement is: Fat layer exposed. Post procedure Diagnosis Wound #2: Same as Pre-Procedure GERHARD, FERRIN [name] ([id number]) [id number].pdf Page 9 of 11 General Notes: scribed for Dr. Celine Ahr by Adline Peals, RN. Plan Follow-up Appointments: Return Appointment in 1 week. - Dr. Celine Ahr - room 2 - Anesthetic: Wound #2 Left,Medial Ankle: (In clinic) Topical Lidocaine 4% applied to wound bed Bathing/ Shower/ Hygiene: May shower and wash wound with soap and water. Edema Control - Lymphedema / SCD / Other: Elevate legs to the level of the heart or above for 30 minutes daily and/or when sitting  for 3-4 times [name] day throughout the day. Avoid standing for long periods of time. The following medication(s) was prescribed: lidocaine topical 4 % cream cream topical was prescribed at facility WOUND #2: - Ankle Wound Laterality: Left, Medial Cleanser: Normal Saline 1 x Per Day/30 Days Discharge Instructions: Cleanse the wound with Normal Saline prior to applying [name] clean dressing using gauze sponges, not tissue or cotton balls. Peri-Wound Care: Sween Lotion (Moisturizing lotion) 1 x Per Day/30 Days Discharge Instructions: Apply moisturizing lotion as directed Topical: Mupirocin Ointment 1 x Per Day/30 Days Discharge Instructions: Apply Mupirocin (Bactroban) as instructed Prim Dressing: Golden Triangle 2x2 (in/in) (Dispense As Written) 1 x Per Day/30 Days ary Discharge Instructions: Apply to wound bed as instructed Secondary Dressing: ABD Pad, 5x9 (Generic) 1 x Per Day/30 Days Discharge Instructions: Apply over primary dressing as directed. Secondary Dressing: Optifoam Non-Adhesive Dressing, 4x4 in 1 x Per Day/30 Days Discharge Instructions: Apply over primary dressing as directed. Secured With: Transpore Surgical T ape, 2x10 (in/yd) 1 x Per Day/30 Days Discharge Instructions: Secure dressing with tape as directed. Com pression Wrap: Kerlix Roll 4.5x3.1 (in/yd) (Generic) 1 x Per Day/30 Days Discharge Instructions: Apply Kerlix and Coban compression as directed. Com pression Wrap: Coban Self-Adherent Wrap 4x5 (in/yd) (Generic) 1 x Per Day/30 Days Discharge Instructions: Apply over Kerlix as directed. 08/01/2022: The wound continues to contract. There is [name] layer of slough on the surface. I used [name] curette to debride the slough from the wound. We will continue topical mupirocin with silver alginate, Kerlix and Coban wrap. Follow-up in  1 week. Electronic Signature(s) Signed: 08/01/2022 4:23:35 PM By: [name] MD FACS Entered By: [name] on 08/01/2022  16:23:35 -------------------------------------------------------------------------------- HxROS Details Patient Name: Date of Service: NA Colin [name] 08/01/2022 2:45 PM Medical Record Number: [id number] Patient Account Number: [id number] Date of Birth/Sex: Treating RN: 03-18-1956 (67 y.o. M) Primary Care Provider: [name] Other Clinician: Referring Provider: Treating Provider/Extender: [name] Weeks in Treatment: 4 Information Obtained From Patient Eyes Medical History: Positive for: Cataracts - Removed 2008 Cardiovascular Medical History: Positive for: Coronary Artery Disease; Hypertension; Myocardial Infarction; Peripheral Arterial Disease Past Medical History NotesSUYOG, PENIX ([id number]) [phone].pdf Page 10 of 11 Hypercholesterolemia Abnormal EKG CABG X3 2019 Gastrointestinal Medical History: Past Medical History Notes: GERD Endocrine Medical History: Positive for: Type II Diabetes Time with diabetes: 24 years Treated with: Insulin, Oral agents Blood sugar tested every day: Yes Tested : Musculoskeletal Medical History: Positive for: Osteomyelitis Past Medical History Notes: Diabetic foot ulcer Neurologic Medical History: Positive for: Neuropathy HBO Extended History Items Eyes: Cataracts Immunizations Pneumococcal Vaccine: Received Pneumococcal Vaccination: Yes Received Pneumococcal Vaccination On or After 60th Birthday: Yes Implantable Devices Yes Family and Social History Cancer: Yes - Father; Diabetes: Yes - Father,Mother,Paternal Grandparents; Heart Disease: Yes - Father; Hereditary Spherocytosis: No; Hypertension: Yes - Father; Kidney Disease: No; Lung Disease: No; Seizures: No; Stroke: No; Thyroid Problems: No; Tuberculosis: No; Never smoker; Marital Status - Married; Alcohol Use: Rarely; Drug Use: No History; Caffeine Use: Daily; Financial Concerns: No; Food, Clothing or Shelter Needs: No;  Support System Lacking: No; Transportation Concerns: No Electronic Signature(s) Signed: 08/01/2022 5:01:55 PM By: [name] MD FACS Entered By: [name] on 08/01/2022 16:21:57 -------------------------------------------------------------------------------- SuperBill Details Patient Name: Date of Service: NA Colin [name] 08/01/2022 Medical Record Number: [id number] Patient Account Number: [id number] Date of Birth/Sex: Treating RN: [birth year] (68 y.o. M) Primary Care Provider: [name] Other Clinician: Referring Provider: Treating Provider/Extender: [name] Weeks in Treatment: 4 Diagnosis Coding ICD-10 Codes Code Description [phone] Non-pressure chronic ulcer of left ankle with fat layer exposed BENNY, TUMOLO [name] ([id number]) [phone].pdf Page 11 of 11 I73.9 Peripheral vascular disease, unspecified E11.622 Type 2 diabetes mellitus with other skin ulcer Facility Procedures : CPT4 Code: [id number] Description: [id number] - DEBRIDE WOUND 1ST 20 SQ CM OR < ICD-10 Diagnosis Description [id number] Non-pressure chronic ulcer of left ankle with fat layer exposed Modifier: Quantity: 1 Physician Procedures : CPT4 Code Description Modifier [id number] - WC PHYS LEVEL 4 - EST PT 25 ICD-10 Diagnosis Description [id number] Non-pressure chronic ulcer of left ankle with fat layer exposed I73.9 Peripheral vascular disease, unspecified E11.622 Type 2 diabetes  mellitus with other skin ulcer Quantity: 1 : [id number] - WC PHYS DEBR WO ANESTH 20 SQ CM ICD-10 Diagnosis Description [id number] Non-pressure chronic ulcer of left ankle with fat layer exposed Quantity: 1 Electronic Signature(s) Signed: 08/01/2022 4:23:53 PM By: [name] MD FACS Entered By: [name] on 08/01/2022 16:23:53

## 2022-08-04 NOTE — Progress Notes (Signed)
[name], [name] ([id number]) [id number] Page 1 of 8 Visit Report for 08/01/2022 Arrival Information Details Patient Name: Date of Service: NA [name] 08/01/2022 2:45 PM Medical Record Number: [id number] Patient Account Number: [id number] Date of Birth/Sex: Treating RN: 02-Nov-1955 (67 y.o. [name] Primary Care Keldric Poyer: [name] Other Clinician: Referring Daeveon Zweber: Treating Krystin Keeven/Extender: [name] in Treatment: 4 Visit Information History Since Last Visit Added or deleted any medications: No Patient Arrived: Knee Scooter Any new allergies or adverse reactions: No Arrival Time: 14:37 Had a fall or experienced change in No Accompanied By: self activities of daily living that may affect Transfer Assistance: None risk of falls: Patient Identification Verified: Yes Signs or symptoms of abuse/neglect since last visito No Secondary Verification Process Completed: Yes Hospitalized since last visit: No Patient Requires Transmission-Based Precautions: No Implantable device outside of the clinic excluding No Patient Has Alerts: No cellular tissue based products placed in the center since last visit: Has Dressing in Place as Prescribed: Yes Pain Present Now: No Electronic Signature(s) Signed: 08/01/2022 4:41:15 PM By: Adline Peals Entered By: Adline Peals on 08/01/2022 14:38:40 -------------------------------------------------------------------------------- Encounter Discharge Information Details Patient Name: Date of Service: NA [name] A. 08/01/2022 2:45 PM Medical Record Number: [id number] Patient Account Number: [id number] Date of Birth/Sex: Treating RN: 1955/10/10 (68 y.o. [name] Primary Care Gilmar Bua: [name] Other Clinician: Referring Dare Spillman: Treating Naja Apperson/Extender: [name] in Treatment: 4 Encounter Discharge Information Items Post  Procedure Vitals Discharge Condition: Stable Temperature (F): 97.7 Ambulatory Status: Knee Scooter Pulse (bpm): 96 Discharge Destination: Home Respiratory Rate (breaths/min): 16 Transportation: Private Auto Blood Pressure (mmHg): 165/88 Accompanied By: self Schedule Follow-up Appointment: Yes Clinical Summary of Care: Patient Declined Electronic Signature(s) Signed: 08/01/2022 4:41:15 PM By: Adline Peals Entered By: Adline Peals on 08/01/2022 15:05:36 [name] ([id number] Page 2 of 8 -------------------------------------------------------------------------------- Lower Extremity Assessment Details Patient Name: Date of Service: [name] 08/01/2022 2:45 PM Medical Record Number: [id number] Patient Account Number: [id number] Date of Birth/Sex: Treating RN: Mar 08, 1956 (67 y.o. [name] Primary Care Tariah Transue: [name] Other Clinician: Referring Usman Millett: Treating Jennifer Holland/Extender: [name] Weeks in Treatment: 4 Edema Assessment Assessed: [Left: No] [Right: No] [Left: Edema] [Right: :] Calf Left: Right: Point of Measurement: From Medial Instep 37 cm Ankle Left: Right: Point of Measurement: From Medial Instep 23 cm Electronic Signature(s) Signed: 08/01/2022 4:41:15 PM By: Adline Peals Entered By: Adline Peals on 08/01/2022 14:46:41 -------------------------------------------------------------------------------- Multi Wound Chart Details Patient Name: Date of Service: NA [name] A. 08/01/2022 2:45 PM Medical Record Number: [id number] Patient Account Number: [id number] Date of Birth/Sex: Treating RN: [birth year] (67 y.o. M) Primary Care Onda Kattner: [name] Other Clinician: Referring Yao Hyppolite: Treating Kaliq Lege/Extender: [name] Weeks in Treatment: 4 Vital Signs Height(in): 74 Capillary Blood Glucose(mg/dl): 182 Weight(lbs):  187 Pulse(bpm): 96 Body Mass Index(BMI): 24 Blood Pressure(mmHg): 165/88 Temperature(F): 97.7 Respiratory Rate(breaths/min): 16 [2:Photos:] [N/A:N/A] Left, Medial Ankle N/A N/A Wound Location: Pressure Injury N/A N/A Wounding Event: Diabetic Wound/Ulcer of the Lower N/A N/A Primary Etiology: Extremity Cataracts, Coronary Artery Disease, N/A N/A Comorbid History: Hypertension, Myocardial Infarction, Ruppel, Osmond A ([id number] Page 3 of 8 Peripheral Arterial Disease, Type II Diabetes, Osteomyelitis, Neuropathy 06/13/2022 N/A N/A Date Acquired: 4 N/A N/A Weeks of Treatment: Open N/A N/A Wound Status: No N/A N/A Wound Recurrence: 1.4x0.6x0.1 N/A N/A Measurements L x W x D (cm) 0.66 N/A N/A A (  cm) : rea 0.066 N/A N/A Volume (cm) : 67.70% N/A N/A % Reduction in A rea: 67.60% N/A N/A % Reduction in Volume: Grade 1 N/A N/A Classification: Medium N/A N/A Exudate A mount: Serous N/A N/A Exudate Type: amber N/A N/A Exudate Color: Distinct, outline attached N/A N/A Wound Margin: Large (67-100%) N/A N/A Granulation A mount: Red, Pink N/A N/A Granulation Quality: Small (1-33%) N/A N/A Necrotic A mount: Fat Layer (Subcutaneous Tissue): Yes N/A N/A Exposed Structures: Fascia: No Tendon: No Muscle: No Joint: No Bone: No Medium (34-66%) N/A N/A Epithelialization: Debridement - Selective/Open Wound N/A N/A Debridement: Pre-procedure Verification/Time Out 14:55 N/A N/A Taken: Lidocaine 4% Topical Solution N/A N/A Pain Control: Slough N/A N/A Tissue Debrided: Non-Viable Tissue N/A N/A Level: 0.84 N/A N/A Debridement A (sq cm): rea Curette N/A N/A Instrument: Minimum N/A N/A Bleeding: Pressure N/A N/A Hemostasis A chieved: Procedure was tolerated well N/A N/A Debridement Treatment Response: 1.4x0.6x0.1 N/A N/A Post Debridement Measurements L x W x D (cm) 0.066 N/A N/A Post Debridement Volume: (cm) No  Abnormalities Noted N/A N/A Periwound Skin Texture: Maceration: Yes N/A N/A Periwound Skin Moisture: Dry/Scaly: No No Abnormalities Noted N/A N/A Periwound Skin Color: No Abnormality N/A N/A Temperature: Debridement N/A N/A Procedures Performed: Treatment Notes Wound #2 (Ankle) Wound Laterality: Left, Medial Cleanser Normal Saline Discharge Instruction: Cleanse the wound with Normal Saline prior to applying a clean dressing using gauze sponges, not tissue or cotton balls. Peri-Wound Care Sween Lotion (Moisturizing lotion) Discharge Instruction: Apply moisturizing lotion as directed Topical Mupirocin Ointment Discharge Instruction: Apply Mupirocin (Bactroban) as instructed Primary Dressing Sorbalgon AG Dressing 2x2 (in/in) Discharge Instruction: Apply to wound bed as instructed Secondary Dressing ABD Pad, 5x9 Discharge Instruction: Apply over primary dressing as directed. Optifoam Non-Adhesive Dressing, 4x4 in Discharge Instruction: Apply over primary dressing as directed. Secured With Transpore Surgical Tape, 2x10 (in/yd) Discharge Instruction: Secure dressing with tape as directed. Compression Wrap Kerlix Roll 4.5x3.1 (in/yd) Discharge Instruction: Apply Kerlix and Coban compression as directed. ZYION, SMIALEK A ([id number]) [id number] Page 4 of 8 Coban Self-Adherent Wrap 4x5 (in/yd) Discharge Instruction: Apply over Kerlix as directed. Compression Stockings Add-Ons Electronic Signature(s) Signed: 08/01/2022 4:20:55 PM By: [name] MD FACS Entered By: [name] on 08/01/2022 16:20:55 -------------------------------------------------------------------------------- Multi-Disciplinary Care Plan Details Patient Name: Date of Service: Colin Loveless A. 08/01/2022 2:45 PM Medical Record Number: [id number] Patient Account Number: [id number] Date of Birth/Sex: Treating RN: 1955-07-03 (67 y.o. [name] Primary Care  Sherie Dobrowolski: [name] Other Clinician: Referring Amritpal Shropshire: Treating Adelise Buswell/Extender: [name] Weeks in Treatment: 4 Active Inactive Pressure Nursing Diagnoses: Knowledge deficit related to causes and risk factors for pressure ulcer development Knowledge deficit related to management of pressures ulcers Goals: Patient will remain free of pressure ulcers Date Initiated: 07/04/2022 Target Resolution Date: 09/09/2022 Goal Status: Active Interventions: Assess: immobility, friction, shearing, incontinence upon admission and as needed Assess potential for pressure ulcer upon admission and as needed Treatment Activities: Patient referred for pressure reduction/relief devices : 07/04/2022 Notes: Wound/Skin Impairment Nursing Diagnoses: Knowledge deficit related to smoking impact on wound healing Goals: Ulcer/skin breakdown will have a volume reduction of 30% by week 4 Date Initiated: 07/04/2022 Target Resolution Date: 09/09/2022 Goal Status: Active Interventions: Assess ulceration(s) every visit Provide education on ulcer and skin care Notes: Electronic Signature(s) Signed: 08/01/2022 4:41:15 PM By: Adline Peals Entered By: Adline Peals on 08/01/2022 14:36:58 [name] ([id number] Page 5 of 8 -------------------------------------------------------------------------------- Pain Assessment Details Patient Name: Date of Service:  [name], [name] 08/01/2022 2:45 PM Medical Record Number: [id number] Patient Account Number: [id number] Date of Birth/Sex: Treating RN: May 16, 1956 (67 y.o. [name] Primary Care Jalacia Mattila: [name] Other Clinician: Referring Jesscia Imm: Treating Falisa Lamora/Extender: [name] Weeks in Treatment: 4 Active Problems Location of Pain Severity and Description of Pain Patient Has Paino No Site Locations Rate the pain. Current Pain Level: 0 Pain Management  and Medication Current Pain Management: Electronic Signature(s) Signed: 08/01/2022 4:41:15 PM By: Adline Peals Entered By: Adline Peals on 08/01/2022 14:38:49 -------------------------------------------------------------------------------- Patient/Caregiver Education Details Patient Name: Date of Service: NA [name] 2/19/2024andnbsp2:45 PM Medical Record Number: [id number] Patient Account Number: [id number] Date of Birth/Gender: Treating RN: 06-21-1955 (67 y.o. [name] Primary Care Physician: [name] Other Clinician: Referring Physician: Treating Physician/Extender: [name] in Treatment: 4 Education Assessment Education Provided To: Patient Education Topics Provided Wound/Skin Impairment: Methods: Explain/Verbal Responses: Reinforcements needed, State content correctly RISHARD, GOTTSCH A ([id number]) [id number] Page 6 of 8 Electronic Signature(s) Signed: 08/01/2022 4:41:15 PM By: Adline Peals Entered By: Adline Peals on 08/01/2022 14:37:12 -------------------------------------------------------------------------------- Wound Assessment Details Patient Name: Date of Service: Colin Loveless A. 08/01/2022 2:45 PM Medical Record Number: [id number] Patient Account Number: [id number] Date of Birth/Sex: Treating RN: January 31, 1956 (67 y.o. [name] Primary Care Taniqua Issa: [name] Other Clinician: Referring Lyndal Alamillo: Treating Raeqwon Lux/Extender: [name] Weeks in Treatment: 4 Wound Status Wound Number: 2 Primary Diabetic Wound/Ulcer of the Lower Extremity Etiology: Wound Location: Left, Medial Ankle Wound Open Wounding Event: Pressure Injury Status: Date Acquired: 06/13/2022 Comorbid Cataracts, Coronary Artery Disease, Hypertension, Myocardial Weeks Of Treatment: 4 History: Infarction, Peripheral Arterial Disease, Type II Diabetes, Clustered Wound: No  Osteomyelitis, Neuropathy Photos Wound Measurements Length: (cm) 1.4 Width: (cm) 0.6 Depth: (cm) 0.1 Area: (cm) 0.66 Volume: (cm) 0.066 % Reduction in Area: 67.7% % Reduction in Volume: 67.6% Epithelialization: Medium (34-66%) Tunneling: No Undermining: No Wound Description Classification: Grade 1 Wound Margin: Distinct, outline attached Exudate Amount: Medium Exudate Type: Serous Exudate Color: amber Foul Odor After Cleansing: No Slough/Fibrino Yes Wound Bed Granulation Amount: Large (67-100%) Exposed Structure Granulation Quality: Red, Pink Fascia Exposed: No Necrotic Amount: Small (1-33%) Fat Layer (Subcutaneous Tissue) Exposed: Yes Necrotic Quality: Adherent Slough Tendon Exposed: No Muscle Exposed: No Joint Exposed: No Bone Exposed: No Periwound Skin Texture Texture Color No Abnormalities Noted: Yes No Abnormalities Noted: Yes Moisture Temperature / Pain No Abnormalities Noted: No Temperature: No Abnormality Teng, Anthony A ([id number] Page 7 of 8 Dry / Scaly: No Maceration: Yes Treatment Notes Wound #2 (Ankle) Wound Laterality: Left, Medial Cleanser Normal Saline Discharge Instruction: Cleanse the wound with Normal Saline prior to applying a clean dressing using gauze sponges, not tissue or cotton balls. Peri-Wound Care Sween Lotion (Moisturizing lotion) Discharge Instruction: Apply moisturizing lotion as directed Topical Mupirocin Ointment Discharge Instruction: Apply Mupirocin (Bactroban) as instructed Primary Dressing Sorbalgon AG Dressing 2x2 (in/in) Discharge Instruction: Apply to wound bed as instructed Secondary Dressing ABD Pad, 5x9 Discharge Instruction: Apply over primary dressing as directed. Optifoam Non-Adhesive Dressing, 4x4 in Discharge Instruction: Apply over primary dressing as directed. Secured With Transpore Surgical Tape, 2x10 (in/yd) Discharge Instruction: Secure dressing with tape as  directed. Compression Wrap Kerlix Roll 4.5x3.1 (in/yd) Discharge Instruction: Apply Kerlix and Coban compression as directed. Coban Self-Adherent Wrap 4x5 (in/yd) Discharge Instruction: Apply over Kerlix as directed. Compression Stockings Add-Ons Electronic Signature(s) Signed: 08/01/2022 4:41:15 PM By: Adline Peals Signed: 08/03/2022 4:23:23 PM  By: Blanche East RN Entered By: Blanche East on 08/01/2022 14:51:31 -------------------------------------------------------------------------------- Vitals Details Patient Name: Date of Service: NA [name] A. 08/01/2022 2:45 PM Medical Record Number: [id number] Patient Account Number: [id number] Date of Birth/Sex: Treating RN: [birth year] (67 y.o. [name] Primary Care Letha Mirabal: [name] Other Clinician: Referring Chyann Ambrocio: Treating Saralee Bolick/Extender: [name] Weeks in Treatment: 4 Vital Signs Time Taken: 14:45 Temperature (F): 97.7 Height (in): 74 Pulse (bpm): 96 Weight (lbs): 187 Respiratory Rate (breaths/min): 16 Body Mass Index (BMI): 24 Blood Pressure (mmHg): 165/88 Capillary Blood Glucose (mg/dl): 182 Reference Range: 80 - 120 mg / dl AHAMAD, SUMMAR A ([id number] Page 8 of 8 Electronic Signature(s) Signed: 08/01/2022 4:41:15 PM By: Adline Peals Entered By: Adline Peals on 08/01/2022 14:46:35

## 2022-08-08 ENCOUNTER — Encounter (HOSPITAL_BASED_OUTPATIENT_CLINIC_OR_DEPARTMENT_OTHER): Payer: BC Managed Care – PPO | Admitting: General Surgery

## 2022-08-08 DIAGNOSIS — E11622 Type 2 diabetes mellitus with other skin ulcer: Secondary | ICD-10-CM | POA: Diagnosis not present

## 2022-08-08 DIAGNOSIS — I10 Essential (primary) hypertension: Secondary | ICD-10-CM | POA: Diagnosis not present

## 2022-08-08 DIAGNOSIS — Z951 Presence of aortocoronary bypass graft: Secondary | ICD-10-CM | POA: Diagnosis not present

## 2022-08-08 DIAGNOSIS — K219 Gastro-esophageal reflux disease without esophagitis: Secondary | ICD-10-CM | POA: Diagnosis not present

## 2022-08-08 DIAGNOSIS — E1151 Type 2 diabetes mellitus with diabetic peripheral angiopathy without gangrene: Secondary | ICD-10-CM | POA: Diagnosis not present

## 2022-08-08 DIAGNOSIS — Z833 Family history of diabetes mellitus: Secondary | ICD-10-CM | POA: Diagnosis not present

## 2022-08-08 DIAGNOSIS — E1140 Type 2 diabetes mellitus with diabetic neuropathy, unspecified: Secondary | ICD-10-CM | POA: Diagnosis not present

## 2022-08-08 DIAGNOSIS — E7800 Pure hypercholesterolemia, unspecified: Secondary | ICD-10-CM | POA: Diagnosis not present

## 2022-08-08 DIAGNOSIS — I2510 Atherosclerotic heart disease of native coronary artery without angina pectoris: Secondary | ICD-10-CM | POA: Diagnosis not present

## 2022-08-08 DIAGNOSIS — L97322 Non-pressure chronic ulcer of left ankle with fat layer exposed: Secondary | ICD-10-CM | POA: Diagnosis not present

## 2022-08-09 NOTE — Progress Notes (Signed)
[name], [name] ([id number]) [id number] Page 1 of 10 Visit Report for 08/08/2022 Chief Complaint Document Details Patient Name: Date of Service: NA [name] 08/08/2022 2:45 PM Medical Record Number: [id number] Patient Account Number: [id number] Date of Birth/Sex: Treating RN: May 12, 1956 (67 y.o. M) Primary Care Provider: Cher [name] Other Clinician: Referring Provider: Treating Provider/Extender: [name] Weeks in Treatment: 5 Information Obtained from: Patient Chief Complaint Patients presents for treatment of an open diabetic ulcer with exposed bone and osteomyelitis 07/04/2022: Diabetic ankle ulcer Electronic Signature(s) Signed: 08/08/2022 3:44:37 PM By: Colin Maudlin MD FACS Entered By: [name] on 08/08/2022 15:44:37 -------------------------------------------------------------------------------- Debridement Details Patient Name: Date of Service: NA [name]. 08/08/2022 2:45 PM Medical Record Number: [id number] Patient Account Number: [id number] Date of Birth/Sex: Treating RN: [birth year] (67 y.o. [name] Primary Care Provider: Cher [name] Other Clinician: Referring Provider: Treating Provider/Extender: [name] Weeks in Treatment: 5 Debridement Performed for Assessment: Wound #2 Left,Medial Ankle Performed By: Physician Colin Maudlin, MD Debridement Type: Debridement Severity of Tissue Pre Debridement: Fat layer exposed Level of Consciousness (Pre-procedure): Awake and Alert Pre-procedure Verification/Time Out Yes - 15:01 Taken: Start Time: 15:01 Pain Control: Lidocaine 4% T opical Solution T Area Debrided (L x W): otal 1.4 (cm) x 0.7 (cm) = 0.98 (cm) Tissue and other material debrided: Non-Viable, Slough, Slough Level: Non-Viable Tissue Debridement Description: Selective/Open Wound Instrument: Curette Bleeding: Minimum Hemostasis Achieved: Pressure Response to  Treatment: Procedure was tolerated well Level of Consciousness (Post- Awake and Alert procedure): Post Debridement Measurements of Total Wound Length: (cm) 1.4 Width: (cm) 0.7 Depth: (cm) 0.1 Volume: (cm) 0.077 Character of Wound/Ulcer Post Debridement: Improved Severity of Tissue Post Debridement: Fat layer exposed Post Procedure Diagnosis Same as Pre-procedure Notes scribed for Dr. Celine [name] by Colin Peals, RN Electronic Signature(s) Signed: 08/08/2022 4:48:14 PM By: [name] Signed: 08/08/2022 5:09:54 PM By: Colin Maudlin MD [name], Signed: 08/08/2022 5:09:54 PM By: Colin Maudlin MD [place] ([id number] Page 2 of 10 Entered By: [name] on 08/08/2022 15:02:00 -------------------------------------------------------------------------------- HPI Details Patient Name: Date of Service: NA [name] 08/08/2022 2:45 PM Medical Record Number: [id number] Patient Account Number: [id number] Date of Birth/Sex: Treating RN: 03-31-1956 (67 y.o. M) Primary Care Provider: Cher [name] Other Clinician: Referring Provider: Treating Provider/Extender: [name] Weeks in Treatment: 5 History of Present Illness HPI Description: ADMISSION 08/25/2021 This is [name] 67 year old man who initially presented to his primary care provider in September 2022 with pain in his left foot. He was sent for an x-ray and while the x-ray was being performed, the tech pointed out [name] wound on his foot that the patient was not aware existed. He does have type 2 diabetes with significant neuropathy. His diabetes is suboptimally controlled with his most recent A1c being 8.5. He also has [name] history of coronary artery disease status post three- vessel CABG. he was initially seen by orthopedics, but they referred him to [facility]. He has undergone at least 7 operations/debridements and several applications of  skin substitute under the care of podiatry. He has been in [name] for much of this time. His most recent procedure was July 28, 2021. [name] was biopsied and was found to be consistent with osteomyelitis. Culture also returned positive for corynebacterium. He was seen on August 16, 2021 by infectious disease. [name] PICC line has been placed and he will be receiving  [name] 6-week course of IV daptomycin and cefepime. In October 2022, he underwent lower extremity vascular studies. Results are copied here: Right: Resting right ankle-brachial index is within normal range. No evidence of significant right lower extremity arterial disease. The right toe-brachial index is abnormal. Left: Resting left ankle-brachial index indicates mild left lower extremity arterial disease. The left toe-brachial index is abnormal. He has not been seen by vascular surgery despite these findings. He presented to clinic today in [name] and is using [name] to offload. Wound VAC was in place. Once this was removed, [name] large ulcer was identified on the left midfoot/ankle. Bone is frankly exposed. There is no malodorous or purulent drainage. There is some granulation tissue over the central portion of the exposed bone. There is [name] tunnel that extends posteriorly for roughly 10 cm. It has been discussed with him by multiple providers that he is at very high risk of losing his lower leg because of this wound. He is extremely eager to avoid this outcome and is here today to review his options as well as receive ongoing wound care. 09/03/2021: Here for reevaluation of his wound. There does not appear to have been any substantial improvement overall since our last visit. He has been in [name] with white foam overlying the exposed bone. We are working on getting him approved for hyperbaric oxygen therapy. 09/10/2021: We are in the process of getting him cleared to begin hyperbaric oxygen therapy. He still  needs to obtain [name] chest x-ray. Although the wound measurements are roughly the same, I think the overall appearance of the wound is better. The exposed bone has [name] bit more granulation tissue covering it. He has not received [name] vascular surgery appointment to reevaluate his flow to the wound. 09/17/2021: He has been approved for hyperbaric oxygen therapy and completed his chest x-ray, which I reviewed and it appears normal. The tunnels at the 12 and 10:00 positions are smaller. There is more granulation tissue covering the exposed bone and the undermining has decreased. He still has not received [name] vascular surgery appointment. 09/24/2021: He initiated hyperbaric oxygen therapy this week and is tolerating it well. He has an appointment with vascular surgery coming up on May 16. The granulation tissue is covering more of the exposed bone and both tunnels are [name] bit smaller. 10/01/2021: He continues to tolerate hyperbaric oxygen therapy. He saw infectious disease and they are planning to pull his PICC line. He has been initiated on oral antibiotics (doxycycline and Augmentin). The wound looks about the same but the tunnels are [name] little bit smaller. The skin seems to be contracting somewhat around the exposed bone. 10/08/2021: The wound is still about the same size, but the tunnels continue to come in and the skin is contracting around the exposed bone. He continues to have some accumulation of necrotic material in the inferoposterior aspect of the wound as well as accumulation at the 12:00 tunnel area. 10/15/2021: The wound is smaller today. The tunnels continue to come in. There is less necrotic tissue present. He does have some periwound maceration. 10/22/2021: The wound is about the same size. There is [name] little bit less undermining at the distal portion. The exposed bone is dark and I am not sure if this is staining from silver nitrate or his VAC sponge or if it represents necrosis. The tunnels are shallower but  he does have some serous drainage coming from the 10:00 tunnel. He continues to tolerate hyperbaric  oxygen therapy well. 10/29/2021: The undermining continues to improve. The tunnels are about the same. He has good granulation tissue overlying the majority of the exposed bone. It does appear that perhaps the tubing from his wound VAC has been eroding the skin at the 12 clock position. He continues to accumulate senescent epithelium around the borders of the wound. 11/05/2021: The undermining is almost completely resolved. The tunnels have contracted fairly significantly. No significant slough or debris accumulation. There is still senescent epithelium accumulation around the borders of the wound. He has been tolerating hyperbaric oxygen therapy well. 11/12/2021: Despite the measurements of the wound being about the same, the wound has changed in its shape and overall, I think it is improved. The undermining has resolved and the tunnels continue to shorten. There is good granulation tissue encroaching over the small area of bone that has remained exposed at the 12 o'clock position. Minimal slough accumulation. He continues to tolerate hyperbaric oxygen therapy well. 11/19/2021: I took [name] PCR culture last week. There was overgrowth of yeast. He is already taking suppressive doxycycline and Augmentin. I added fluconazole to his regimen. The wound is smaller again today. The tunnels continue to shorten. He continues to do well with hyperbaric oxygen therapy. 11/26/2021: For some reason, his foot has become macerated. The wound is narrower but about the same dimensions in its longitudinal aspect. The tunnels continue to shorten. He has some slough buildup on the wound as well as some heaped up senescent epithelium around the perimeter. [name], [name] ([id number]) [id number] Page 3 of 10 12/03/2021: No further maceration of his foot has occurred. The wound has contracted quite  significantly from last week. The tunnel at 10:00 is closed. The tunnel at 12:00 is down to just [name] couple of millimeters. No other undermining is present. There is soft tissue coverage of the previously exposed bone. There is just [name] bit of slough and biofilm on the wound surface. 12/10/2021: The wound is looking good. It turns out the tunnel at 12:00 is only exposed when the patient dorsiflexes his foot. It is about 2 cm in depth when he does this; when his foot is in plantarflexion, the tunnel is closed. The bone that was visible at the 12:00 tunnel is completely covered with granulation tissue, but there does feel like some exposed bone deeper into the tunnel area. There is senescent skin heaped up around the periphery. Minimal slough on the wound surface. 12/16/2021: The wound dimensions are roughly the same. The surface has nice granulation tissue. The exposed bone at the 12:00 tunnel continues to be covered with more soft tissue. 7/14; patient's wound measures smaller today. Using the wound VAC with underlying collagen. He is also being treated with HBO for underlying osteomyelitis. He tells me he is on doxycycline and ampicillin follows with infectious disease next week 12/31/2021: The wound continues to contract. Unfortunately, the area where the track pad and tubing have been rubbing continues to look like it is applying friction. He says that the home health nurses that have been applying the [facility] have been putting gauze underneath the tubing, but nonetheless there is ongoing tissue breakdown at this site. Light slough accumulation on the wound surface. The tunnel continues to contract. He is tolerating HBO without difficulty. 01/07/2022: Bridging the wound VAC away from the ankle has resulted in significant improvement in the tissue at the apex of the wound. The tunnel is still present and is not all that much shorter, but the overall wound surface  is very robust and healthy looking. Minimal  slough accumulation. No concern for acute infection. 01/21/2022: The wound continues to contract and has [name] robust granulation tissue surface. The tunnel has come in considerably and is down to about 1.4 cm. There is still bone exposed within the tunnel but the rest of it is well covered. There is some senescent epithelium at the wound margins and [name] little bit of slough on the surface. 01/28/2022: No significant change in the wound this week, but there has not been any reaccumulation of senescent epithelium or slough. The tunnel is perhaps [name] millimeter less in depth. He has been approved for Apligraf and we will apply this today. 02/04/2022: The wound has contracted somewhat and the tunnel has filled in completely. The wound surface is clean. He is here for Apligraf #2. 02/11/2022: The wound has contracted further and is now nearly flush with the surrounding skin surface. Light layer of slough. Apligraf #3 plan for today. 02/18/2022: There is [name] band of epithelium trying to cut across the superior portion of the wound. There is robust granulation tissue with just [name] light layer of slough and biofilm on the surface. There was [name] little bit of greenish drainage in the wound VAC but none appreciated on the site itself. He is here for Apligraf #4. 03/04/2022: The wound has contracted considerably. There is good granulation tissue on the surface. Minimal biofilm. He is here for Apligraf #5. 03/18/2022: The wound has epithelialized to the point that it has been divided into 2 areas. Both areas are smaller in total than at his previous visit. There is good granulation tissue present with just [name] little bit of periwound eschar and surface slough. 10/13; left medial foot. The patient has completed treatment with hyperbaric oxygen for underlying osteomyelitis he has had [name] nice response in the wound. Noted today that he still had some greenish drainage. Previously he has been treated with topical gentamicin but apparently  that was stopped 2 weeks ago. Fortunately the wound is measuring smaller 10/20; wound looks better no hypergranulation minimal drainage. Granulation tissue looks healthy using gentamicin Hydrofera Blue under compression 04/08/2022: The wound continues to contract tremendously. The response to his treatment for hypertrophic granulation tissue has been quite remarkable. There are just [name] couple of open areas remaining. 04/18/2022: His wound is healed. READMISSION 07/04/2022: He returns to clinic with [name] new ulcer adjacent to where his previous large wound had been. He says that he was wearing boots and using [name] small padded dressing to protect the freshly healed ankle wound. Apparently the dressing rolled up and rubbed on his skin, causing [name] new wound. He contacted our office last week concerned about the appearance. There is [name] small oval wound on his left medial ankle. It is fairly clean with some periwound dry skin and eschar. 07/11/2022: The wound is about the same size this week. There is [name] layer of rubbery slough on the surface. It has [name] fairly strong odor. 07/18/2022: The wound measured larger this week due to small satellite areas opening in [name] fan pattern distal to the primary wound. He continues to accumulate slough on the wound surface. The culture that I took last week only grew out low levels of skin flora. 07/25/2022: The satellite areas that had opened last week have closed. The main wound is smaller with some slough accumulation. 08/01/2022: The wound continues to contract. There is [name] layer of slough on the surface. 08/08/2022: The wound is [name] little bit smaller again  this week. Still with slough buildup. Electronic Signature(s) Signed: 08/08/2022 3:45:04 PM By: Colin Maudlin MD FACS Entered By: [name] on 08/08/2022 15:45:03 -------------------------------------------------------------------------------- Physical Exam Details Patient Name: Date of Service: NA [name].  08/08/2022 2:45 PM Medical Record Number: [id number] Patient Account Number: [id number] Date of Birth/Sex: Treating RN: 1956-01-02 (67 y.o. M) Primary Care Provider: Cher [name] Other Clinician: Referring Provider: Treating Provider/Extender: [name] in Treatment: 5 [name], [name] ([id number]) [id number] Page 4 of 10 Constitutional Hypertensive, asymptomatic. . . . no acute distress. Respiratory Normal work of breathing on room air. Notes 08/08/2022: The wound is [name] little bit smaller again this week. Still with slough buildup. Electronic Signature(s) Signed: 08/08/2022 3:45:32 PM By: Colin Maudlin MD FACS Entered By: [name] on 08/08/2022 15:45:32 -------------------------------------------------------------------------------- Physician Orders Details Patient Name: Date of Service: NA [name]. 08/08/2022 2:45 PM Medical Record Number: [id number] Patient Account Number: [id number] Date of Birth/Sex: Treating RN: Aug 01, 1955 (67 y.o. [name] Primary Care Provider: Cher [name] Other Clinician: Referring Provider: Treating Provider/Extender: [name] Weeks in Treatment: 5 Verbal / Phone Orders: No Diagnosis Coding ICD-10 Coding Code Description [phone] Non-pressure chronic ulcer of left ankle with fat layer exposed I73.9 Peripheral vascular disease, unspecified E11.622 Type 2 diabetes mellitus with other skin ulcer Follow-up Appointments ppointment in 2 weeks. - Dr. Celine [name] - room 2 Return [name] Anesthetic Wound #2 Left,Medial Ankle (In clinic) Topical Lidocaine 4% applied to wound bed Bathing/ Shower/ Hygiene May shower and wash wound with soap and water. Edema Control - Lymphedema / SCD / Other Elevate legs to the level of the heart or above for 30 minutes daily and/or when sitting for 3-4 times [name] day throughout the day. Avoid standing for long periods of time. Wound  Treatment Wound #2 - Ankle Wound Laterality: Left, Medial Cleanser: Normal Saline 1 x Per Day/30 Days Discharge Instructions: Cleanse the wound with Normal Saline prior to applying [name] clean dressing using gauze sponges, not tissue or cotton balls. Peri-Wound Care: Sween Lotion (Moisturizing lotion) 1 x Per Day/30 Days Discharge Instructions: Apply moisturizing lotion as directed Topical: Mupirocin Ointment 1 x Per Day/30 Days Discharge Instructions: Apply Mupirocin (Bactroban) as instructed Prim Dressing: [place] 2x2 (in/in) (Dispense As Written) 1 x Per Day/30 Days ary Discharge Instructions: Apply to wound bed as instructed Secondary Dressing: ABD Pad, 5x9 (Generic) 1 x Per Day/30 Days Discharge Instructions: Apply over primary dressing as directed. Secondary Dressing: Optifoam Non-Adhesive Dressing, 4x4 in 1 x Per Day/30 Days Discharge Instructions: Apply over primary dressing as directed. Secured With: Transpore Surgical Tape, 2x10 (in/yd) 1 x Per Day/30 Days Discharge Instructions: Secure dressing with tape as directed. Compression Wrap: Kerlix Roll 4.5x3.1 (in/yd) (Generic) 1 x Per Day/30 Days Discharge Instructions: Apply Kerlix and Coban compression as directed. [name], [name] ([id number]) [id number] Page 5 of 10 Compression Wrap: Coban Self-Adherent Wrap 4x5 (in/yd) (Generic) 1 x Per Day/30 Days Discharge Instructions: Apply over Kerlix as directed. Patient Medications llergies: codeine [name] Notifications Medication Indication Start End 08/08/2022 lidocaine DOSE topical 4 % cream - cream topical Electronic Signature(s) Signed: 08/08/2022 5:09:54 PM By: Colin Maudlin MD FACS Entered By: [name] on 08/08/2022 15:45:45 -------------------------------------------------------------------------------- Problem List Details Patient Name: Date of Service: NA [name]. 08/08/2022 2:45 PM Medical Record Number:  [id number] Patient Account Number: [id number] Date of Birth/Sex: Treating RN: 1955-12-11 (67 y.o. M) Primary Care Provider: Cher [name] Other Clinician: Referring  Provider: Treating Provider/Extender: [name] Weeks in Treatment: 5 Active Problems ICD-10 Encounter Code Description Active Date MDM Diagnosis L97.322 Non-pressure chronic ulcer of left ankle with fat layer exposed 07/04/2022 No Yes I73.9 Peripheral vascular disease, unspecified 07/04/2022 No Yes E11.622 Type 2 diabetes mellitus with other skin ulcer 07/04/2022 No Yes Inactive Problems Resolved Problems Electronic Signature(s) Signed: 08/08/2022 3:44:18 PM By: Colin Maudlin MD FACS Entered By: [name] on 08/08/2022 15:44:18 -------------------------------------------------------------------------------- Progress Note Details Patient Name: Date of Service: NA Colin [name] 08/08/2022 2:45 PM Medical Record Number: [id number] Patient Account Number: [id number] Date of Birth/Sex: Treating RN: 12/12/1955 (67 y.o. M) Primary Care Provider: Cher [name] Other Clinician: Referring Provider: Treating Provider/Extender: [name] Weeks in Treatment: 5 Subjective Chief Complaint Information obtained from Patient Patients presents for treatment of an open diabetic ulcer with exposed bone and osteomyelitis 07/04/2022: Diabetic ankle ulcer [name], [name] ([id number]) [phone].pdf Page 6 of 10 History of Present Illness (HPI) ADMISSION 08/25/2021 This is [name] 67 year old man who initially presented to his primary care provider in September 2022 with pain in his left foot. He was sent for an x-ray and while the x-ray was being performed, the tech pointed out [name] wound on his foot that the patient was not aware existed. He does have type 2 diabetes with significant neuropathy. His diabetes is suboptimally controlled with his most recent A1c being 8.5. He also has [name]  history of coronary artery disease status post three- vessel CABG. he was initially seen by orthopedics, but they referred him to [facility]. He has undergone at least 7 operations/debridements and several applications of skin substitute under the care of podiatry. He has been in [name] for much of this time. His most recent procedure was July 28, 2021. [name] was biopsied and was found to be consistent with osteomyelitis. Culture also returned positive for corynebacterium. He was seen on August 16, 2021 by infectious disease. [name] PICC line has been placed and he will be receiving [name] 6-week course of IV daptomycin and cefepime. In October 2022, he underwent lower extremity vascular studies. Results are copied here: Right: Resting right ankle-brachial index is within normal range. No evidence of significant right lower extremity arterial disease. The right toe-brachial index is abnormal. Left: Resting left ankle-brachial index indicates mild left lower extremity arterial disease. The left toe-brachial index is abnormal. He has not been seen by vascular surgery despite these findings. He presented to clinic today in [name] and is using [name] to offload. Wound VAC was in place. Once this was removed, [name] large ulcer was identified on the left midfoot/ankle. Bone is frankly exposed. There is no malodorous or purulent drainage. There is some granulation tissue over the central portion of the exposed bone. There is [name] tunnel that extends posteriorly for roughly 10 cm. It has been discussed with him by multiple providers that he is at very high risk of losing his lower leg because of this wound. He is extremely eager to avoid this outcome and is here today to review his options as well as receive ongoing wound care. 09/03/2021: Here for reevaluation of his wound. There does not appear to have been any substantial improvement overall since our last visit. He  has been in [name] with white foam overlying the exposed bone. We are working on getting him approved for hyperbaric oxygen therapy. 09/10/2021: We are in the  process of getting him cleared to begin hyperbaric oxygen therapy. He still needs to obtain [name] chest x-ray. Although the wound measurements are roughly the same, I think the overall appearance of the wound is better. The exposed bone has [name] bit more granulation tissue covering it. He has not received [name] vascular surgery appointment to reevaluate his flow to the wound. 09/17/2021: He has been approved for hyperbaric oxygen therapy and completed his chest x-ray, which I reviewed and it appears normal. The tunnels at the 12 and 10:00 positions are smaller. There is more granulation tissue covering the exposed bone and the undermining has decreased. He still has not received [name] vascular surgery appointment. 09/24/2021: He initiated hyperbaric oxygen therapy this week and is tolerating it well. He has an appointment with vascular surgery coming up on May 16. The granulation tissue is covering more of the exposed bone and both tunnels are [name] bit smaller. 10/01/2021: He continues to tolerate hyperbaric oxygen therapy. He saw infectious disease and they are planning to pull his PICC line. He has been initiated on oral antibiotics (doxycycline and Augmentin). The wound looks about the same but the tunnels are [name] little bit smaller. The skin seems to be contracting somewhat around the exposed bone. 10/08/2021: The wound is still about the same size, but the tunnels continue to come in and the skin is contracting around the exposed bone. He continues to have some accumulation of necrotic material in the inferoposterior aspect of the wound as well as accumulation at the 12:00 tunnel area. 10/15/2021: The wound is smaller today. The tunnels continue to come in. There is less necrotic tissue present. He does have some periwound maceration. 10/22/2021: The wound is  about the same size. There is [name] little bit less undermining at the distal portion. The exposed bone is dark and I am not sure if this is staining from silver nitrate or his VAC sponge or if it represents necrosis. The tunnels are shallower but he does have some serous drainage coming from the 10:00 tunnel. He continues to tolerate hyperbaric oxygen therapy well. 10/29/2021: The undermining continues to improve. The tunnels are about the same. He has good granulation tissue overlying the majority of the exposed bone. It does appear that perhaps the tubing from his wound VAC has been eroding the skin at the 12 clock position. He continues to accumulate senescent epithelium around the borders of the wound. 11/05/2021: The undermining is almost completely resolved. The tunnels have contracted fairly significantly. No significant slough or debris accumulation. There is still senescent epithelium accumulation around the borders of the wound. He has been tolerating hyperbaric oxygen therapy well. 11/12/2021: Despite the measurements of the wound being about the same, the wound has changed in its shape and overall, I think it is improved. The undermining has resolved and the tunnels continue to shorten. There is good granulation tissue encroaching over the small area of bone that has remained exposed at the 12 o'clock position. Minimal slough accumulation. He continues to tolerate hyperbaric oxygen therapy well. 11/19/2021: I took [name] PCR culture last week. There was overgrowth of yeast. He is already taking suppressive doxycycline and Augmentin. I added fluconazole to his regimen. The wound is smaller again today. The tunnels continue to shorten. He continues to do well with hyperbaric oxygen therapy. 11/26/2021: For some reason, his foot has become macerated. The wound is narrower but about the same dimensions in its longitudinal aspect. The tunnels continue to shorten. He has some slough buildup  on the wound as well  as some heaped up senescent epithelium around the perimeter. 12/03/2021: No further maceration of his foot has occurred. The wound has contracted quite significantly from last week. The tunnel at 10:00 is closed. The tunnel at 12:00 is down to just [name] couple of millimeters. No other undermining is present. There is soft tissue coverage of the previously exposed bone. There is just [name] bit of slough and biofilm on the wound surface. 12/10/2021: The wound is looking good. It turns out the tunnel at 12:00 is only exposed when the patient dorsiflexes his foot. It is about 2 cm in depth when he does this; when his foot is in plantarflexion, the tunnel is closed. The bone that was visible at the 12:00 tunnel is completely covered with granulation tissue, but there does feel like some exposed bone deeper into the tunnel area. There is senescent skin heaped up around the periphery. Minimal slough on the wound surface. 12/16/2021: The wound dimensions are roughly the same. The surface has nice granulation tissue. The exposed bone at the 12:00 tunnel continues to be covered with more soft tissue. 7/14; patient's wound measures smaller today. Using the wound VAC with underlying collagen. He is also being treated with HBO for underlying osteomyelitis. He tells me he is on doxycycline and ampicillin follows with infectious disease next week 12/31/2021: The wound continues to contract. Unfortunately, the area where the track pad and tubing have been rubbing continues to look like it is applying friction. He says that the home health nurses that have been applying the [facility] have been putting gauze underneath the tubing, but nonetheless there is ongoing tissue breakdown at this site. Light slough accumulation on the wound surface. The tunnel continues to contract. He is tolerating HBO without difficulty. [name], [name] ([id number]) [id number] Page 7 of 10 01/07/2022: Bridging the wound VAC away  from the ankle has resulted in significant improvement in the tissue at the apex of the wound. The tunnel is still present and is not all that much shorter, but the overall wound surface is very robust and healthy looking. Minimal slough accumulation. No concern for acute infection. 01/21/2022: The wound continues to contract and has [name] robust granulation tissue surface. The tunnel has come in considerably and is down to about 1.4 cm. There is still bone exposed within the tunnel but the rest of it is well covered. There is some senescent epithelium at the wound margins and [name] little bit of slough on the surface. 01/28/2022: No significant change in the wound this week, but there has not been any reaccumulation of senescent epithelium or slough. The tunnel is perhaps [name] millimeter less in depth. He has been approved for Apligraf and we will apply this today. 02/04/2022: The wound has contracted somewhat and the tunnel has filled in completely. The wound surface is clean. He is here for Apligraf #2. 02/11/2022: The wound has contracted further and is now nearly flush with the surrounding skin surface. Light layer of slough. Apligraf #3 plan for today. 02/18/2022: There is [name] band of epithelium trying to cut across the superior portion of the wound. There is robust granulation tissue with just [name] light layer of slough and biofilm on the surface. There was [name] little bit of greenish drainage in the wound VAC but none appreciated on the site itself. He is here for Apligraf #4. 03/04/2022: The wound has contracted considerably. There is good granulation tissue on the surface. Minimal biofilm. He is here for  Apligraf #5. 03/18/2022: The wound has epithelialized to the point that it has been divided into 2 areas. Both areas are smaller in total than at his previous visit. There is good granulation tissue present with just [name] little bit of periwound eschar and surface slough. 10/13; left medial foot. The patient has completed  treatment with hyperbaric oxygen for underlying osteomyelitis he has had [name] nice response in the wound. Noted today that he still had some greenish drainage. Previously he has been treated with topical gentamicin but apparently that was stopped 2 weeks ago. Fortunately the wound is measuring smaller 10/20; wound looks better no hypergranulation minimal drainage. Granulation tissue looks healthy using gentamicin Hydrofera Blue under compression 04/08/2022: The wound continues to contract tremendously. The response to his treatment for hypertrophic granulation tissue has been quite remarkable. There are just [name] couple of open areas remaining. 04/18/2022: His wound is healed. READMISSION 07/04/2022: He returns to clinic with [name] new ulcer adjacent to where his previous large wound had been. He says that he was wearing boots and using [name] small padded dressing to protect the freshly healed ankle wound. Apparently the dressing rolled up and rubbed on his skin, causing [name] new wound. He contacted our office last week concerned about the appearance. There is [name] small oval wound on his left medial ankle. It is fairly clean with some periwound dry skin and eschar. 07/11/2022: The wound is about the same size this week. There is [name] layer of rubbery slough on the surface. It has [name] fairly strong odor. 07/18/2022: The wound measured larger this week due to small satellite areas opening in [name] fan pattern distal to the primary wound. He continues to accumulate slough on the wound surface. The culture that I took last week only grew out low levels of skin flora. 07/25/2022: The satellite areas that had opened last week have closed. The main wound is smaller with some slough accumulation. 08/01/2022: The wound continues to contract. There is [name] layer of slough on the surface. 08/08/2022: The wound is [name] little bit smaller again this week. Still with slough buildup. Patient History Information obtained from Patient. Family  History Cancer - Father, Diabetes - Father,Mother,Paternal Grandparents, Heart Disease - Father, Hypertension - Father, No family history of Hereditary Spherocytosis, Kidney Disease, Lung Disease, Seizures, Stroke, Thyroid Problems, Tuberculosis. Social History Never smoker, Marital Status - Married, Alcohol Use - Rarely, Drug Use - No History, Caffeine Use - Daily. Medical History Eyes Patient has history of Cataracts - Removed 2008 Cardiovascular Patient has history of Coronary Artery Disease, Hypertension, Myocardial Infarction, Peripheral Arterial Disease Endocrine Patient has history of Type II Diabetes Musculoskeletal Patient has history of Osteomyelitis Neurologic Patient has history of Neuropathy Medical [name] Surgical History Notes nd Cardiovascular Hypercholesterolemia Abnormal EKG CABG X3 2019 Gastrointestinal GERD Musculoskeletal Diabetic foot ulcer Lapaglia, Seamus [name] ([id number].pdf Page 8 of 10 Objective Constitutional Hypertensive, asymptomatic. no acute distress. Vitals Time Taken: 2:42 PM, Height: 74 in, Weight: 187 lbs, BMI: 24, Temperature: 97.5 F, Pulse: 69 bpm, Respiratory Rate: 16 breaths/min, Blood Pressure: 177/98 mmHg. Respiratory Normal work of breathing on room air. General Notes: 08/08/2022: The wound is [name] little bit smaller again this week. Still with slough buildup. Integumentary (Hair, Skin) Wound #2 status is Open. Original cause of wound was Pressure Injury. The date acquired was: 06/13/2022. The wound has been in treatment 5 weeks. The wound is located on the Left,Medial Ankle. The wound measures 1.4cm length x 0.7cm width x 0.1cm depth; 0.77cm^2  area and 0.077cm^3 volume. There is Fat Layer (Subcutaneous Tissue) exposed. There is no tunneling or undermining noted. There is [name] medium amount of serous drainage noted. The wound margin is distinct with the outline attached to the wound base. There is medium (34-66%) red,  pink granulation within the wound bed. There is [name] medium (34-66%) amount of necrotic tissue within the wound bed including Adherent Slough. The periwound skin appearance had no abnormalities noted for texture. The periwound skin appearance had no abnormalities noted for color. The periwound skin appearance did not exhibit: Dry/Scaly, Maceration. Periwound temperature was noted as No Abnormality. Assessment Active Problems ICD-10 Non-pressure chronic ulcer of left ankle with fat layer exposed Peripheral vascular disease, unspecified Type 2 diabetes mellitus with other skin ulcer Procedures Wound #2 Pre-procedure diagnosis of Wound #2 is [name] Diabetic Wound/Ulcer of the Lower Extremity located on the Left,Medial Ankle .Severity of Tissue Pre Debridement is: Fat layer exposed. There was [name] Selective/Open Wound Non-Viable Tissue Debridement with [name] total area of 0.98 sq cm performed by Colin Maudlin, MD. With the following instrument(s): Curette to remove Non-Viable tissue/material. Material removed includes [facility] after achieving pain control using Lidocaine 4% T opical Solution. No specimens were taken. [name] time out was conducted at 15:01, prior to the start of the procedure. [name] Minimum amount of bleeding was controlled with Pressure. The procedure was tolerated well. Post Debridement Measurements: 1.4cm length x 0.7cm width x 0.1cm depth; 0.077cm^3 volume. Character of Wound/Ulcer Post Debridement is improved. Severity of Tissue Post Debridement is: Fat layer exposed. Post procedure Diagnosis Wound #2: Same as Pre-Procedure General Notes: scribed for Dr. Celine [name] by Colin Peals, RN. Plan Follow-up Appointments: Return Appointment in 2 weeks. - Dr. Celine [name] - room 2 Anesthetic: Wound #2 Left,Medial Ankle: (In clinic) Topical Lidocaine 4% applied to wound bed Bathing/ Shower/ Hygiene: May shower and wash wound with soap and water. Edema Control - Lymphedema / SCD / Other: Elevate legs to the  level of the heart or above for 30 minutes daily and/or when sitting for 3-4 times [name] day throughout the day. Avoid standing for long periods of time. The following medication(s) was prescribed: lidocaine topical 4 % cream cream topical was prescribed at facility WOUND #2: - Ankle Wound Laterality: Left, Medial Cleanser: Normal Saline 1 x Per Day/30 Days Discharge Instructions: Cleanse the wound with Normal Saline prior to applying [name] clean dressing using gauze sponges, not tissue or cotton balls. Peri-Wound Care: Sween Lotion (Moisturizing lotion) 1 x Per Day/30 Days Discharge Instructions: Apply moisturizing lotion as directed Topical: Mupirocin Ointment 1 x Per Day/30 Days Discharge Instructions: Apply Mupirocin (Bactroban) as instructed Prim Dressing: [place] 2x2 (in/in) (Dispense As Written) 1 x Per Day/30 Days ary Discharge Instructions: Apply to wound bed as instructed Secondary Dressing: ABD Pad, 5x9 (Generic) 1 x Per Day/30 Days Discharge Instructions: Apply over primary dressing as directed. Secondary Dressing: Optifoam Non-Adhesive Dressing, 4x4 in 1 x Per Day/30 Days Discharge Instructions: Apply over primary dressing as directed. Secured With: Transpore Surgical T ape, 2x10 (in/yd) 1 x Per Day/30 Days Discharge Instructions: Secure dressing with tape as directed. Com pression Wrap: Kerlix Roll 4.5x3.1 (in/yd) (Generic) 1 x Per Day/30 Days [name], [name] ([id number]) [phone].pdf Page 9 of 10 Discharge Instructions: Apply Kerlix and Coban compression as directed. Compression Wrap: Coban Self-Adherent Wrap 4x5 (in/yd) (Generic) 1 x Per Day/30 Days Discharge Instructions: Apply over Kerlix as directed. 08/08/2022: The wound is [name] little bit smaller again this week. Still with slough  buildup. I used [name] curette to debride the slough from his wound. We will continue topical mupirocin with silver alginate, Kerlix and Ace bandage wrapping. He will be  out of town next week so we will follow-up in 2 weeks. Electronic Signature(s) Signed: 08/08/2022 3:46:22 PM By: Colin Maudlin MD FACS Entered By: [name] on 08/08/2022 15:46:22 -------------------------------------------------------------------------------- HxROS Details Patient Name: Date of Service: NA Colin [name] 08/08/2022 2:45 PM Medical Record Number: [id number] Patient Account Number: [id number] Date of Birth/Sex: Treating RN: September 06, 1955 (67 y.o. M) Primary Care Provider: Cher [name] Other Clinician: Referring Provider: Treating Provider/Extender: [name] Weeks in Treatment: 5 Information Obtained From Patient Eyes Medical History: Positive for: Cataracts - Removed 2008 Cardiovascular Medical History: Positive for: Coronary Artery Disease; Hypertension; Myocardial Infarction; Peripheral Arterial Disease Past Medical History Notes: Hypercholesterolemia Abnormal EKG CABG X3 2019 Gastrointestinal Medical History: Past Medical History Notes: GERD Endocrine Medical History: Positive for: Type II Diabetes Time with diabetes: 24 years Treated with: Insulin, Oral agents Blood sugar tested every day: Yes Tested : Musculoskeletal Medical History: Positive for: Osteomyelitis Past Medical History Notes: Diabetic foot ulcer Neurologic Medical History: Positive for: Neuropathy HBO Extended History Items Eyes: Cataracts [name], [name] ([id number] Page 10 of 10 Immunizations Pneumococcal Vaccine: Received Pneumococcal Vaccination: Yes Received Pneumococcal Vaccination On or After 60th Birthday: Yes Implantable Devices Yes Family and Social History Cancer: Yes - Father; Diabetes: Yes - Father,Mother,Paternal Grandparents; Heart Disease: Yes - Father; Hereditary Spherocytosis: No; Hypertension: Yes - Father; Kidney Disease: No; Lung Disease: No; Seizures: No; Stroke: No; Thyroid Problems: No;  Tuberculosis: No; Never smoker; Marital Status - Married; Alcohol Use: Rarely; Drug Use: No History; Caffeine Use: Daily; Financial Concerns: No; Food, Clothing or Shelter Needs: No; Support System Lacking: No; Transportation Concerns: No Engineer, maintenance) Signed: 08/08/2022 5:09:54 PM By: Colin Maudlin MD FACS Entered By: [name] on 08/08/2022 15:45:10 -------------------------------------------------------------------------------- SuperBill Details Patient Name: Date of Service: NA Colin [name] 08/08/2022 Medical Record Number: [id number] Patient Account Number: [id number] Date of Birth/Sex: Treating RN: [birth year] (67 y.o. M) Primary Care Provider: Cher [name] Other Clinician: Referring Provider: Treating Provider/Extender: [name] Weeks in Treatment: 5 Diagnosis Coding ICD-10 Codes Code Description [phone] Non-pressure chronic ulcer of left ankle with fat layer exposed I73.9 Peripheral vascular disease, unspecified E11.622 Type 2 diabetes mellitus with other skin ulcer Facility Procedures : CPT4 Code: [id number] Description: [id number] - DEBRIDE WOUND 1ST 20 SQ CM OR < ICD-10 Diagnosis Description L97.322 Non-pressure chronic ulcer of left ankle with fat layer exposed Modifier: Quantity: 1 Physician Procedures : CPT4 Code Description Modifier [id number] - WC PHYS LEVEL 4 - EST PT 25 ICD-10 Diagnosis Description L97.322 Non-pressure chronic ulcer of left ankle with fat layer exposed I73.9 Peripheral vascular disease, unspecified E11.622 Type 2 diabetes  mellitus with other skin ulcer Quantity: 1 : [id number] - WC PHYS DEBR WO ANESTH 20 SQ CM ICD-10 Diagnosis Description L97.322 Non-pressure chronic ulcer of left ankle with fat layer exposed Quantity: 1 Electronic Signature(s) Signed: 08/08/2022 3:47:04 PM By: Colin Maudlin MD FACS Entered By: [name] on 08/08/2022 15:47:04

## 2022-08-09 NOTE — Progress Notes (Signed)
SLAYDE, DOFFLEMYER [name] ([id number]) [id number] Page 1 of 7 Visit Report for 08/08/2022 Arrival Information Details Patient Name: Date of Service: NA [name] 08/08/2022 2:45 PM Medical Record Number: [id number] Patient Account Number: [id number] Date of Birth/Sex: Treating RN: 18-Feb-1956 (67 y.o. [name] Primary Care [name]: [name] Other Clinician: Referring [name]: Treating [name]/Extender: [name] in Treatment: 5 Visit Information History Since Last Visit Added or deleted any medications: No Patient Arrived: Knee Scooter Any new allergies or adverse reactions: No Arrival Time: 14:40 Had [name] fall or experienced change in No Accompanied [name]: self activities of daily living that may affect Transfer Assistance: None risk of falls: Patient Identification Verified: Yes Signs or symptoms of abuse/neglect since last visito No Secondary Verification Process Completed: Yes Hospitalized since last visit: No Patient Requires Transmission-Based Precautions: No Implantable device outside of the clinic excluding No Patient Has Alerts: No cellular tissue based products placed in the center since last visit: Has Dressing in Place as Prescribed: Yes Has Compression in Place as Prescribed: Yes Pain Present Now: No Electronic Signature(s) Signed: 08/08/2022 4:48:14 PM [name]: [name]: [name] -------------------------------------------------------------------------------- Encounter Discharge Information Details Patient Name: Date of Service: NA Colin [name]. 08/08/2022 2:45 PM Medical Record Number: [id number] Patient Account Number: [id number] Date of Birth/Sex: Treating RN: 1955-08-15 (67 y.o. [name] Primary Care [name]: [name] Other Clinician: Referring [name]: Treating [name]/Extender: [name] in Treatment:  5 Encounter Discharge Information Items Post Procedure Vitals Discharge Condition: Stable Temperature (F): 97.5 Ambulatory Status: Knee Scooter Pulse (bpm): 69 Discharge Destination: Home Respiratory Rate (breaths/min): 16 Transportation: Private Auto Blood Pressure (mmHg): 177/98 Accompanied [name]: self Schedule Follow-up Appointment: Yes Clinical Summary of Care: Patient Declined Electronic Signature(s) Signed: 08/08/2022 4:48:14 PM [name]: [name]: [name] on 08/08/2022 15:15:00 -------------------------------------------------------------------------------- Lower Extremity Assessment Details Patient Name: Date of Service: [name]. 08/08/2022 2:45 PM Medical Record Number: [id number] Patient Account Number: [id number] Date of Birth/Sex: Treating RN: [birth year] (67 y.o. [name] Primary Care [name]: [name] Other Clinician: Referring Haely Leyland: Treating [name]/Extender: [name] in Treatment: 5 Edema Assessment N[Left: RIYAAN, WALDNER [name] ([id number].pdf Page 2 of 7] Assessed: [Left: No] [Right: No] [Left: Edema] [Right: :] Calf Left: Right: Point of Measurement: From Medial Instep 35 cm Ankle Left: Right: Point of Measurement: From Medial Instep 24 cm Vascular Assessment Pulses: Dorsalis Pedis Palpable: [Left:Yes] Electronic Signature(s) Signed: 08/08/2022 4:48:14 PM [name]: [name]: [name] on 08/08/2022 14:45:18 -------------------------------------------------------------------------------- Multi Wound Chart Details Patient Name: Date of Service: NA [name]. 08/08/2022 2:45 PM Medical Record Number: [id number] Patient Account Number: [id number] Date of Birth/Sex: Treating RN: [birth year] (67 y.o. M) Primary Care Ying Blankenhorn: [name] Other Clinician: Referring Barrie Sigmund: Treating Prescious Hurless/Extender: [name] in Treatment: 5 Vital Signs Height(in): 74 Pulse(bpm): 22 Weight(lbs): 187 Blood Pressure(mmHg): 177/98 Body Mass Index(BMI): 24 Temperature(F): 97.5 Respiratory Rate(breaths/min): 16 [2:Photos:] [N/[name]:N/[name]] Left, Medial Ankle N/[name]/[name] Wound Location: Pressure Injury N/[name]/[name] Wounding Event: Diabetic Wound/Ulcer of the Lower N/[name]/[name] Primary Etiology: Extremity Cataracts, Coronary Artery Disease, N/[name]/[name] Comorbid History: Hypertension, Myocardial Infarction, Peripheral Arterial Disease, Type II Diabetes, Osteomyelitis, Neuropathy 06/13/2022 N/[name]/[name] Date Acquired: 5 N/[name]/[name] [name] of Treatment: Open N/[name]/[name] Wound Status: No N/[name]/[name] Wound Recurrence: 1.4x0.7x0.1 N/[name]/[name] Measurements L x W x D (cm) 0.77  N/[name]/[name] [name] (cm) : rea 0.077 N/[name]/[name] Volume (cm) : 62.30% N/[name]/[name] % Reduction in [name] rea: 62.30% N/[name]/[name] % Reduction in Volume: Grade 1 N/[name]/[name] Classification: Medium N/[name]/[name] Exudate [name] mount: Serous N/[name]/[name] Exudate Type: amber N/[name]/[name] Exudate Color: Distinct, outline attached N/[name]/[name] Wound Margin: Medium (34-66%) N/[name]/[name] Granulation [name] mount: Red, Pink N/[name]/[name] Granulation QualityREYNALDO, [name] ([id number] Page 3 of 7 Medium (34-66%) N/[name]/[name] Necrotic Amount: Fat Layer (Subcutaneous Tissue): Yes N/[name]/[name] Exposed Structures: Fascia: No Tendon: No Muscle: No Joint: No Bone: No Medium (34-66%) N/[name]/[name] Epithelialization: Debridement - Selective/Open Wound N/[name]/[name] Debridement: Pre-procedure Verification/Time Out 15:01 N/[name]/[name] Taken: Lidocaine 4% Topical Solution N/[name]/[name] Pain Control: Slough N/[name]/[name] Tissue Debrided: Non-Viable Tissue N/[name]/[name] Level: 0.98 N/[name]/[name] Debridement [name] (sq cm): rea Curette N/[name]/[name] Instrument: Minimum N/[name]/[name] Bleeding: Pressure N/[name]/[name] Hemostasis [name] chieved: Procedure was tolerated well N/[name]/[name] Debridement Treatment Response: 1.4x0.7x0.1 N/[name]/[name] Post  Debridement Measurements L x W x D (cm) 0.077 N/[name]/[name] Post Debridement Volume: (cm) No Abnormalities Noted N/[name]/[name] Periwound Skin Texture: Maceration: No N/[name]/[name] Periwound Skin Moisture: Dry/Scaly: No No Abnormalities Noted N/[name]/[name] Periwound Skin Color: No Abnormality N/[name]/[name] Temperature: Debridement N/[name]/[name] Procedures Performed: Treatment Notes Wound #2 (Ankle) Wound Laterality: Left, Medial Cleanser Normal Saline Discharge Instruction: Cleanse the wound with Normal Saline prior to applying [name] clean dressing using gauze sponges, not tissue or cotton balls. Peri-Wound Care Sween Lotion (Moisturizing lotion) Discharge Instruction: Apply moisturizing lotion as directed Topical Mupirocin Ointment Discharge Instruction: Apply Mupirocin (Bactroban) as instructed Primary Dressing Sorbalgon AG Dressing 2x2 (in/in) Discharge Instruction: Apply to wound bed as instructed Secondary Dressing ABD Pad, 5x9 Discharge Instruction: Apply over primary dressing as directed. Optifoam Non-Adhesive Dressing, 4x4 in Discharge Instruction: Apply over primary dressing as directed. Secured With Transpore Surgical Tape, 2x10 (in/yd) Discharge Instruction: Secure dressing with tape as directed. Compression Wrap Kerlix Roll 4.5x3.1 (in/yd) Discharge Instruction: Apply Kerlix and Coban compression as directed. Coban Self-Adherent Wrap 4x5 (in/yd) Discharge Instruction: Apply over Kerlix as directed. Compression Stockings Add-Ons Electronic Signature(s) Signed: 08/08/2022 3:44:28 PM [name]: [name] MD FACS Entered [name]: [name] on 08/08/2022 15:44:28 Colin Harms [name] ([id number] Page 4 of 7 -------------------------------------------------------------------------------- Multi-Disciplinary Care Plan Details Patient Name: Date of Service: NA [name] 08/08/2022 2:45 PM Medical Record Number: [id number] Patient Account Number:  [id number] Date of Birth/Sex: Treating RN: 11-13-1955 (67 y.o. [name] Primary Care Rashawd Laskaris: [name] Other Clinician: Referring Zeya Balles: Treating Ananya Mccleese/Extender: [name] in Treatment: 5 Active Inactive Pressure Nursing Diagnoses: Knowledge deficit related to causes and risk factors for pressure ulcer development Knowledge deficit related to management of pressures ulcers Goals: Patient will remain free of pressure ulcers Date Initiated: 07/04/2022 Target Resolution Date: 09/09/2022 Goal Status: Active Interventions: Assess: immobility, friction, shearing, incontinence upon admission and as needed Assess potential for pressure ulcer upon admission and as needed Treatment Activities: Patient referred for pressure reduction/relief devices : 07/04/2022 Notes: Wound/Skin Impairment Nursing Diagnoses: Knowledge deficit related to smoking impact on wound healing Goals: Ulcer/skin breakdown will have [name] volume reduction of 30% [name] week 4 Date Initiated: 07/04/2022 Target Resolution Date: 09/09/2022 Goal Status: Active Interventions: Assess ulceration(s) every visit Provide education on ulcer and skin care Notes: Electronic Signature(s) Signed: 08/08/2022 4:48:14 PM [name]: [name]: [name] on 08/08/2022 14:53:15 -------------------------------------------------------------------------------- Pain Assessment Details Patient Name: Date  of Service: NA [name]. 08/08/2022 2:45 PM Medical Record Number: [id number] Patient Account Number: [id number] Date of Birth/Sex: Treating RN: Oct 24, 1955 (67 y.o. [name] Primary Care Artavius Stearns: [name] Other Clinician: Referring Aujanae Mccullum: Treating Addylin Manke/Extender: [name] in Treatment: 5 Active Problems Location of Pain Severity and Description of Pain Patient Has Paino No Site Locations Rate the pain. [place], BUGGY [name]  ([id number]) [id number] Page 5 of 7 Rate the pain. Current Pain Level: 0 Pain Management and Medication Current Pain Management: Electronic Signature(s) Signed: 08/08/2022 4:48:14 PM [name]: [name]: [name] on 08/08/2022 14:41:58 -------------------------------------------------------------------------------- Patient/Caregiver Education Details Patient Name: Date of Service: NA [name] 2/26/2024andnbsp2:45 PM Medical Record Number: [id number] Patient Account Number: [id number] Date of Birth/Gender: Treating RN: 23-Nov-1955 (67 y.o. [name] Primary Care Physician: [name] Other Clinician: Referring Physician: Treating Physician/Extender: [name] in Treatment: 5 Education Assessment Education Provided To: Patient Education Topics Provided Safety: Methods: Explain/Verbal Responses: Reinforcements needed, State content correctly Electronic Signature(s) Signed: 08/08/2022 4:48:14 PM [name]: [name]: [name] on 08/08/2022 14:53:30 -------------------------------------------------------------------------------- Wound Assessment Details Patient Name: Date of Service: [name]. 08/08/2022 2:45 PM Medical Record Number: [id number] Patient Account Number: [id number] Date of Birth/Sex: Treating RN: 04-22-1956 (67 y.o. [name] Primary Care Corbyn Steedman: [name] Other Clinician: Referring Suhani Stillion: Treating Americo Vallery/Extender: [name] in Treatment: 5 Wound Status Wound Number: 2 Primary Diabetic Wound/Ulcer of the Lower Extremity Etiology: Wound Location: Left, Medial Ankle Wound Open Wounding Event: Pressure Injury Status: Date Acquired: 06/13/2022 Comorbid Cataracts, Coronary Artery Disease, Hypertension, Myocardial [name] Of Treatment: 5 History: Infarction, Peripheral Arterial Disease, Type II  Diabetes, Clustered Wound: No Osteomyelitis, Neuropathy [name], [name] ([id number] Page 6 of 7 Photos Wound Measurements Length: (cm) 1.4 Width: (cm) 0.7 Depth: (cm) 0.1 Area: (cm) 0.77 Volume: (cm) 0.077 % Reduction in Area: 62.3% % Reduction in Volume: 62.3% Epithelialization: Medium (34-66%) Tunneling: No Undermining: No Wound Description Classification: Grade 1 Wound Margin: Distinct, outline attached Exudate Amount: Medium Exudate Type: Serous Exudate Color: amber Foul Odor After Cleansing: No Slough/Fibrino Yes Wound Bed Granulation Amount: Medium (34-66%) Exposed Structure Granulation Quality: Red, Pink Fascia Exposed: No Necrotic Amount: Medium (34-66%) Fat Layer (Subcutaneous Tissue) Exposed: Yes Necrotic Quality: Adherent Slough Tendon Exposed: No Muscle Exposed: No Joint Exposed: No Bone Exposed: No Periwound Skin Texture Texture Color No Abnormalities Noted: Yes No Abnormalities Noted: Yes Moisture Temperature / Pain No Abnormalities Noted: No Temperature: No Abnormality Dry / Scaly: No Maceration: No Treatment Notes Wound #2 (Ankle) Wound Laterality: Left, Medial Cleanser Normal Saline Discharge Instruction: Cleanse the wound with Normal Saline prior to applying [name] clean dressing using gauze sponges, not tissue or cotton balls. Peri-Wound Care Sween Lotion (Moisturizing lotion) Discharge Instruction: Apply moisturizing lotion as directed Topical Mupirocin Ointment Discharge Instruction: Apply Mupirocin (Bactroban) as instructed Primary Dressing Sorbalgon AG Dressing 2x2 (in/in) Discharge Instruction: Apply to wound bed as instructed Secondary Dressing ABD Pad, 5x9 Discharge Instruction: Apply over primary dressing as directed. Optifoam Non-Adhesive Dressing, 4x4 in Discharge Instruction: Apply over primary dressing as directed. [name], [name] ([id number]) [id number] Page 7  of 7 Secured With Transpore Surgical Tape, 2x10 (in/yd) Discharge Instruction: Secure dressing with tape as directed. Compression Wrap Kerlix Roll 4.5x3.1 (in/yd) Discharge Instruction: Apply Kerlix and Coban compression as directed. Coban Self-Adherent Wrap 4x5 (in/yd) Discharge Instruction: Apply over Kerlix as directed. Compression Stockings Add-Ons  Electronic Signature(s) Signed: 08/08/2022 4:48:14 PM [name]: [name] [name]: [name] on 08/08/2022 14:48:43 -------------------------------------------------------------------------------- Vitals Details Patient Name: Date of Service: NA Colin [name]. 08/08/2022 2:45 PM Medical Record Number: [id number] Patient Account Number: [id number] Date of Birth/Sex: Treating RN: May 31, 1956 (67 y.o. [name] Primary Care Analiz Tvedt: [name] Other Clinician: Referring Natsuko Kelsay: Treating Briani Maul/Extender: [name] in Treatment: 5 Vital Signs Time Taken: 14:42 Temperature (F): 97.5 Height (in): 74 Pulse (bpm): 69 Weight (lbs): 187 Respiratory Rate (breaths/min): 16 Body Mass Index (BMI): 24 Blood Pressure (mmHg): 177/98 Reference Range: 80 - 120 mg / dl Electronic Signature(s) Signed: 08/08/2022 4:48:14 PM [name]: [name]: [name] on 08/08/2022 14:42:41

## 2022-08-12 DIAGNOSIS — H26491 Other secondary cataract, right eye: Secondary | ICD-10-CM | POA: Diagnosis not present

## 2022-08-22 DIAGNOSIS — H938X3 Other specified disorders of ear, bilateral: Secondary | ICD-10-CM | POA: Diagnosis not present

## 2022-08-22 DIAGNOSIS — H6123 Impacted cerumen, bilateral: Secondary | ICD-10-CM | POA: Diagnosis not present

## 2022-08-22 DIAGNOSIS — E119 Type 2 diabetes mellitus without complications: Secondary | ICD-10-CM | POA: Diagnosis not present

## 2022-08-22 DIAGNOSIS — M159 Polyosteoarthritis, unspecified: Secondary | ICD-10-CM | POA: Diagnosis not present

## 2022-08-24 ENCOUNTER — Encounter (HOSPITAL_BASED_OUTPATIENT_CLINIC_OR_DEPARTMENT_OTHER): Payer: BC Managed Care – PPO | Admitting: Internal Medicine

## 2022-08-29 ENCOUNTER — Encounter (HOSPITAL_BASED_OUTPATIENT_CLINIC_OR_DEPARTMENT_OTHER): Payer: BC Managed Care – PPO | Attending: Internal Medicine | Admitting: Internal Medicine

## 2022-08-29 DIAGNOSIS — E11622 Type 2 diabetes mellitus with other skin ulcer: Secondary | ICD-10-CM

## 2022-08-29 DIAGNOSIS — Z951 Presence of aortocoronary bypass graft: Secondary | ICD-10-CM | POA: Diagnosis not present

## 2022-08-29 DIAGNOSIS — E1140 Type 2 diabetes mellitus with diabetic neuropathy, unspecified: Secondary | ICD-10-CM | POA: Insufficient documentation

## 2022-08-29 DIAGNOSIS — L97322 Non-pressure chronic ulcer of left ankle with fat layer exposed: Secondary | ICD-10-CM | POA: Diagnosis not present

## 2022-08-29 DIAGNOSIS — I2510 Atherosclerotic heart disease of native coronary artery without angina pectoris: Secondary | ICD-10-CM | POA: Diagnosis not present

## 2022-08-29 DIAGNOSIS — I739 Peripheral vascular disease, unspecified: Secondary | ICD-10-CM | POA: Insufficient documentation

## 2022-08-30 NOTE — Progress Notes (Signed)
[name], [name] ([id number]) [id number] Page 1 of 12 Visit Report for 08/29/2022 Chief Complaint Document Details Patient Name: Date of Service: NA [name] 08/29/2022 3:30 PM Medical Record Number: [id number] Patient Account Number: [id number] Date of Birth/Sex: Treating RN: May 17, 1956 (67 y.o. M) Primary Care [name]: [name] Other Clinician: Referring [name]: Treating [name]/Extender: [name] in Treatment: 8 Information Obtained from: Patient Chief Complaint Patients presents for treatment of an open diabetic ulcer with exposed bone and osteomyelitis 07/04/2022: Diabetic ankle ulcer Electronic Signature(s) Signed: 08/29/2022 4:26:30 PM By: Colin Shan DO Entered By: [name] on 08/29/2022 16:18:12 -------------------------------------------------------------------------------- Debridement Details Patient Name: Date of Service: NA [name]. 08/29/2022 3:30 PM Medical Record Number: [id number] Patient Account Number: [id number] Date of Birth/Sex: Treating RN: [birth year] (67 y.o. [name]: [name] Other Clinician: Referring [name]: Treating [name]/Extender: [name] in Treatment: 8 Debridement Performed for Assessment: Wound #3 Left,Distal Ankle Performed By: Physician Colin Shan, DO Debridement Type: Debridement Severity of Tissue Pre Debridement: Fat layer exposed Level of Consciousness (Pre-procedure): Awake and Alert Pre-procedure Verification/Time Out Yes - 15:56 Taken: Start Time: 15:56 T Area Debrided (L x W): otal 0.5 (cm) x 0.3 (cm) = 0.15 (cm) Tissue and other material debrided: Non-Viable, Callus, Slough, Subcutaneous, Slough Level: Skin/Subcutaneous Tissue Debridement Description: Excisional Instrument: Curette Bleeding: Minimum Hemostasis Achieved: Pressure Response to Treatment: Procedure was  tolerated well Level of Consciousness (Post- Awake and Alert procedure): Post Debridement Measurements of Total Wound Length: (cm) 0.5 Width: (cm) 0.3 Depth: (cm) 0.3 Volume: (cm) 0.035 Character of Wound/Ulcer Post Debridement: Improved Severity of Tissue Post Debridement: Fat layer exposed Post Procedure Diagnosis Same as Pre-procedure Notes scribed for Dr. Heber [place] by Colin Peals, RN Electronic Signature(s) Signed: 08/29/2022 4:17:30 PM By: Colin Shan DO Signed: 08/29/2022 4:54:15 PM By: Lance Coon [name] ([id number] Page 2 of 12 Entered By: [name] on 08/29/2022 15:57:31 -------------------------------------------------------------------------------- Debridement Details Patient Name: Date of Service: NA [name]. 08/29/2022 3:30 PM Medical Record Number: [id number] Patient Account Number: [id number] Date of Birth/Sex: Treating RN: 01-01-1956 (67 y.o. [name]: [name] Other Clinician: Referring [name]: Treating [name]/Extender: [name] in Treatment: 8 Debridement Performed for Assessment: Wound #2 Left,Medial Ankle Performed By: Physician Colin Shan, DO Debridement Type: Debridement Severity of Tissue Pre Debridement: Fat layer exposed Level of Consciousness (Pre-procedure): Awake and Alert Pre-procedure Verification/Time Out Yes - 15:56 Taken: Start Time: 15:56 T Area Debrided (L x W): otal 0.1 (cm) x 0.4 (cm) = 0.04 (cm) Tissue and other material debrided: Non-Viable, Slough, Skin: Epidermis, Slough Level: Skin/Epidermis Debridement Description: Selective/Open Wound Instrument: Curette Bleeding: Minimum Hemostasis Achieved: Pressure Response to Treatment: Procedure was tolerated well Level of Consciousness (Post- Awake and Alert procedure): Post Debridement Measurements of Total Wound Length: (cm)  0.1 Width: (cm) 0.4 Depth: (cm) 0.1 Volume: (cm) 0.003 Character of Wound/Ulcer Post Debridement: Improved Severity of Tissue Post Debridement: Fat layer exposed Post Procedure Diagnosis Same as Pre-procedure Notes scribed for Dr. Heber [place] by Colin Peals, RN Electronic Signature(s) Signed: 08/29/2022 4:17:30 PM By: Colin Shan DO Signed: 08/29/2022 4:54:15 PM By: [name] Entered By: [name] on 08/29/2022 15:58:36 -------------------------------------------------------------------------------- HPI Details Patient Name: Date of Service: NA [name]. 08/29/2022 3:30 PM Medical Record Number: [id number] Patient Account Number: [id number] Date of Birth/Sex: Treating RN: 1955-09-11 (67 y.o. M) Primary Care [name]: [name]  Other Clinician: Referring [name]: Treating [name]/Extender: [name] in Treatment: 8 History of Present Illness HPI Description: ADMISSION 08/25/2021 This is [name] 67 year old man who initially presented to his primary care [name] in September 2022 with pain in his left foot. He was sent for an x-ray and while the x-ray was being performed, the tech pointed out [name] wound on his foot that the patient was not aware existed. He does have type 2 diabetes with significant neuropathy. His diabetes is suboptimally controlled with his most recent A1c being 8.5. He also has [name] history of coronary artery disease status post three- vessel CABG. he was initially seen by orthopedics, but they referred him to [facility]. He has undergone at least 7 operations/debridements and several applications of skin substitute under the care of podiatry. He has been in [name] for much of this time. His most recent procedure was July 28, 2021. [name] was biopsied and was found to be consistent with osteomyelitis. Culture also returned positive for corynebacterium. He was seen on August 16, 2021 by infectious disease. [name] PICC line has been placed and he will be receiving [name] 6-week course of IV daptomycin and cefepime. In October 2022, he underwent lower extremity vascular studies. Results are copied here: Colin, [name] ([id number]) [id number] Page 3 of 12 Right: Resting right ankle-brachial index is within normal range. No evidence of significant right lower extremity arterial disease. The right toe-brachial index is abnormal. Left: Resting left ankle-brachial index indicates mild left lower extremity arterial disease. The left toe-brachial index is abnormal. He has not been seen by vascular surgery despite these findings. He presented to clinic today in [name] and is using [name] to offload. Wound VAC was in place. Once this was removed, [name] large ulcer was identified on the left midfoot/ankle. Bone is frankly exposed. There is no malodorous or purulent drainage. There is some granulation tissue over the central portion of the exposed bone. There is [name] tunnel that extends posteriorly for roughly 10 cm. It has been discussed with him by multiple providers that he is at very high risk of losing his lower leg because of this wound. He is extremely eager to avoid this outcome and is here today to review his options as well as receive ongoing wound care. 09/03/2021: Here for reevaluation of his wound. There does not appear to have been any substantial improvement overall since our last visit. He has been in [name] with white foam overlying the exposed bone. We are working on getting him approved for hyperbaric oxygen therapy. 09/10/2021: We are in the process of getting him cleared to begin hyperbaric oxygen therapy. He still needs to obtain [name] chest x-ray. Although the wound measurements are roughly the same, I think the overall appearance of the wound is better. The exposed bone has [name] bit more granulation tissue covering it. He has not received [name]  vascular surgery appointment to reevaluate his flow to the wound. 09/17/2021: He has been approved for hyperbaric oxygen therapy and completed his chest x-ray, which I reviewed and it appears normal. The tunnels at the 12 and 10:00 positions are smaller. There is more granulation tissue covering the exposed bone and the undermining has decreased. He still has not received [name] vascular surgery appointment. 09/24/2021: He initiated hyperbaric oxygen therapy this week and is tolerating it well. He has an appointment with vascular surgery coming up on May 16.  The granulation tissue is covering more of the exposed bone and both tunnels are [name] bit smaller. 10/01/2021: He continues to tolerate hyperbaric oxygen therapy. He saw infectious disease and they are planning to pull his PICC line. He has been initiated on oral antibiotics (doxycycline and Augmentin). The wound looks about the same but the tunnels are [name] little bit smaller. The skin seems to be contracting somewhat around the exposed bone. 10/08/2021: The wound is still about the same size, but the tunnels continue to come in and the skin is contracting around the exposed bone. He continues to have some accumulation of necrotic material in the inferoposterior aspect of the wound as well as accumulation at the 12:00 tunnel area. 10/15/2021: The wound is smaller today. The tunnels continue to come in. There is less necrotic tissue present. He does have some periwound maceration. 10/22/2021: The wound is about the same size. There is [name] little bit less undermining at the distal portion. The exposed bone is dark and I am not sure if this is staining from silver nitrate or his VAC sponge or if it represents necrosis. The tunnels are shallower but he does have some serous drainage coming from the 10:00 tunnel. He continues to tolerate hyperbaric oxygen therapy well. 10/29/2021: The undermining continues to improve. The tunnels are about the same. He has good granulation  tissue overlying the majority of the exposed bone. It does appear that perhaps the tubing from his wound VAC has been eroding the skin at the 12 clock position. He continues to accumulate senescent epithelium around the borders of the wound. 11/05/2021: The undermining is almost completely resolved. The tunnels have contracted fairly significantly. No significant slough or debris accumulation. There is still senescent epithelium accumulation around the borders of the wound. He has been tolerating hyperbaric oxygen therapy well. 11/12/2021: Despite the measurements of the wound being about the same, the wound has changed in its shape and overall, I think it is improved. The undermining has resolved and the tunnels continue to shorten. There is good granulation tissue encroaching over the small area of bone that has remained exposed at the 12 o'clock position. Minimal slough accumulation. He continues to tolerate hyperbaric oxygen therapy well. 11/19/2021: I took [name] PCR culture last week. There was overgrowth of yeast. He is already taking suppressive doxycycline and Augmentin. I added fluconazole to his regimen. The wound is smaller again today. The tunnels continue to shorten. He continues to do well with hyperbaric oxygen therapy. 11/26/2021: For some reason, his foot has become macerated. The wound is narrower but about the same dimensions in its longitudinal aspect. The tunnels continue to shorten. He has some slough buildup on the wound as well as some heaped up senescent epithelium around the perimeter. 12/03/2021: No further maceration of his foot has occurred. The wound has contracted quite significantly from last week. The tunnel at 10:00 is closed. The tunnel at 12:00 is down to just [name] couple of millimeters. No other undermining is present. There is soft tissue coverage of the previously exposed bone. There is just [name] bit of slough and biofilm on the wound surface. 12/10/2021: The wound is looking  good. It turns out the tunnel at 12:00 is only exposed when the patient dorsiflexes his foot. It is about 2 cm in depth when he does this; when his foot is in plantarflexion, the tunnel is closed. The bone that was visible at the 12:00 tunnel is completely covered with granulation tissue, but there does feel  like some exposed bone deeper into the tunnel area. There is senescent skin heaped up around the periphery. Minimal slough on the wound surface. 12/16/2021: The wound dimensions are roughly the same. The surface has nice granulation tissue. The exposed bone at the 12:00 tunnel continues to be covered with more soft tissue. 7/14; patient's wound measures smaller today. Using the wound VAC with underlying collagen. He is also being treated with HBO for underlying osteomyelitis. He tells me he is on doxycycline and ampicillin follows with infectious disease next week 12/31/2021: The wound continues to contract. Unfortunately, the area where the track pad and tubing have been rubbing continues to look like it is applying friction. He says that the home health nurses that have been applying the [facility] have been putting gauze underneath the tubing, but nonetheless there is ongoing tissue breakdown at this site. Light slough accumulation on the wound surface. The tunnel continues to contract. He is tolerating HBO without difficulty. 01/07/2022: Bridging the wound VAC away from the ankle has resulted in significant improvement in the tissue at the apex of the wound. The tunnel is still present and is not all that much shorter, but the overall wound surface is very robust and healthy looking. Minimal slough accumulation. No concern for acute infection. 01/21/2022: The wound continues to contract and has [name] robust granulation tissue surface. The tunnel has come in considerably and is down to about 1.4 cm. There is still bone exposed within the tunnel but the rest of it is well covered. There is some senescent  epithelium at the wound margins and [name] little bit of slough on the surface. 01/28/2022: No significant change in the wound this week, but there has not been any reaccumulation of senescent epithelium or slough. The tunnel is perhaps [name] millimeter less in depth. He has been approved for Apligraf and we will apply this today. 02/04/2022: The wound has contracted somewhat and the tunnel has filled in completely. The wound surface is clean. He is here for Apligraf #2. [name], [name] ([id number]) [id number] Page 4 of 12 02/11/2022: The wound has contracted further and is now nearly flush with the surrounding skin surface. Light layer of slough. Apligraf #3 plan for today. 02/18/2022: There is [name] band of epithelium trying to cut across the superior portion of the wound. There is robust granulation tissue with just [name] light layer of slough and biofilm on the surface. There was [name] little bit of greenish drainage in the wound VAC but none appreciated on the site itself. He is here for Apligraf #4. 03/04/2022: The wound has contracted considerably. There is good granulation tissue on the surface. Minimal biofilm. He is here for Apligraf #5. 03/18/2022: The wound has epithelialized to the point that it has been divided into 2 areas. Both areas are smaller in total than at his previous visit. There is good granulation tissue present with just [name] little bit of periwound eschar and surface slough. 10/13; left medial foot. The patient has completed treatment with hyperbaric oxygen for underlying osteomyelitis he has had [name] nice response in the wound. Noted today that he still had some greenish drainage. Previously he has been treated with topical gentamicin but apparently that was stopped 2 [name] ago. Fortunately the wound is measuring smaller 10/20; wound looks better no hypergranulation minimal drainage. Granulation tissue looks healthy using gentamicin Hydrofera Blue under compression 04/08/2022:  The wound continues to contract tremendously. The response to his treatment for hypertrophic granulation tissue has been quite remarkable. There  are just [name] couple of open areas remaining. 04/18/2022: His wound is healed. READMISSION 07/04/2022: He returns to clinic with [name] new ulcer adjacent to where his previous large wound had been. He says that he was wearing boots and using [name] small padded dressing to protect the freshly healed ankle wound. Apparently the dressing rolled up and rubbed on his skin, causing [name] new wound. He contacted our office last week concerned about the appearance. There is [name] small oval wound on his left medial ankle. It is fairly clean with some periwound dry skin and eschar. 07/11/2022: The wound is about the same size this week. There is [name] layer of rubbery slough on the surface. It has [name] fairly strong odor. 07/18/2022: The wound measured larger this week due to small satellite areas opening in [name] fan pattern distal to the primary wound. He continues to accumulate slough on the wound surface. The culture that I took last week only grew out low levels of skin flora. 07/25/2022: The satellite areas that had opened last week have closed. The main wound is smaller with some slough accumulation. 08/01/2022: The wound continues to contract. There is [name] layer of slough on the surface. 08/08/2022: The wound is [name] little bit smaller again this week. Still with slough buildup. 3/18; patient presents for follow-up. He has been doing dressing changes on his own with silver alginate and antibiotic ointment. He has been at [employer] over the past 3 [name]. He has developed [name] new wound. Electronic Signature(s) Signed: 08/29/2022 4:26:30 PM By: Colin Shan DO Entered By: [name] on 08/29/2022 16:18:51 -------------------------------------------------------------------------------- Physical Exam Details Patient Name: Date of Service: NA [name]. 08/29/2022 3:30 PM Medical  Record Number: [id number] Patient Account Number: [id number] Date of Birth/Sex: Treating RN: [birth year] (67 y.o. M) Primary Care [name]: [name] Other Clinician: Referring [name]: Treating [name]/Extender: [name] in Treatment: 8 Constitutional respirations regular, non-labored and within target range for patient.. Cardiovascular 2+ dorsalis pedis/posterior tibialis pulses. Psychiatric pleasant and cooperative. Notes 2 open wounds to the left ankle with nonviable tissue, slough and granulation tissue. No signs of surrounding infection. Electronic Signature(s) Signed: 08/29/2022 4:26:30 PM By: Colin Shan DO Entered By: [name] on 08/29/2022 16:19:38 -------------------------------------------------------------------------------- Physician Orders Details Patient Name: Date of Service: NA Colin N, [name]. 08/29/2022 3:30 PM Colin Harms [name] ([id number] Page 5 of 12 Medical Record Number: [id number] Patient Account Number: [id number] Date of Birth/Sex: Treating RN: Oct 24, 1955 (67 y.o. [name]: [name] Other Clinician: Referring [name]: Treating [name]/Extender: [name] in Treatment: 8 Verbal / Phone Orders: No Diagnosis Coding Follow-up Appointments ppointment in 1 week. - Dr. Celine Ahr - room 2 Return [name] Bathing/ Shower/ Hygiene May shower and wash wound with soap and water. Edema Control - Lymphedema / SCD / Other Elevate legs to the level of the heart or above for 30 minutes daily and/or when sitting for 3-4 times [name] day throughout the day. Avoid standing for long periods of time. Wound Treatment Wound #2 - Ankle Wound Laterality: Left, Medial Cleanser: Normal Saline 1 x Per Day/30 Days Discharge Instructions: Cleanse the wound with Normal Saline prior to applying [name] clean dressing using gauze sponges, not tissue or cotton  balls. Peri-Wound Care: Sween Lotion (Moisturizing lotion) 1 x Per Day/30 Days Discharge Instructions: Apply moisturizing lotion as directed Topical: Mupirocin Ointment 1 x Per Day/30 Days Discharge Instructions: Apply Mupirocin (Bactroban) as instructed Prim Dressing:  [employer] Dressing 2x2 (in/in) (Dispense As Written) 1 x Per Day/30 Days ary Discharge Instructions: Apply to wound bed as instructed Secondary Dressing: ABD Pad, 5x9 (Generic) 1 x Per Day/30 Days Discharge Instructions: Apply over primary dressing as directed. Secondary Dressing: Optifoam Non-Adhesive Dressing, 4x4 in 1 x Per Day/30 Days Discharge Instructions: Apply over primary dressing as directed. Secured With: Elastic Bandage 4 inch (ACE bandage) 1 x Per Day/30 Days Discharge Instructions: Secure with ACE bandage as directed. Secured With: [employer], 4.5x3.1 (in/yd) 1 x Per Day/30 Days Discharge Instructions: Secure with Kerlix as directed. Secured With: Transpore Surgical Tape, 2x10 (in/yd) 1 x Per Day/30 Days Discharge Instructions: Secure dressing with tape as directed. Wound #3 - Ankle Wound Laterality: Left, Distal Cleanser: Normal Saline 1 x Per Day/30 Days Discharge Instructions: Cleanse the wound with Normal Saline prior to applying [name] clean dressing using gauze sponges, not tissue or cotton balls. Peri-Wound Care: Sween Lotion (Moisturizing lotion) 1 x Per Day/30 Days Discharge Instructions: Apply moisturizing lotion as directed Topical: Mupirocin Ointment 1 x Per Day/30 Days Discharge Instructions: Apply Mupirocin (Bactroban) as instructed Prim Dressing: [place] 2x2 (in/in) (Dispense As Written) 1 x Per Day/30 Days ary Discharge Instructions: Apply to wound bed as instructed Secondary Dressing: ABD Pad, 5x9 (Generic) 1 x Per Day/30 Days Discharge Instructions: Apply over primary dressing as directed. Secondary Dressing: Optifoam Non-Adhesive Dressing, 4x4 in 1 x Per Day/30  Days Discharge Instructions: Apply over primary dressing as directed. Secured With: Elastic Bandage 4 inch (ACE bandage) 1 x Per Day/30 Days Discharge Instructions: Secure with ACE bandage as directed. Secured With: [employer], 4.5x3.1 (in/yd) 1 x Per Day/30 Days Discharge Instructions: Secure with Kerlix as directed. Secured With: Transpore Surgical Tape, 2x10 (in/yd) 1 x Per Day/30 Days Discharge Instructions: Secure dressing with tape as directed. [name], [name] ([id number]) [id number] Page 6 of 12 Electronic Signature(s) Signed: 08/29/2022 4:26:30 PM By: Colin Shan DO Previous Signature: 08/29/2022 4:17:30 PM Version By: Colin Shan DO Entered By: [name] on 08/29/2022 16:19:48 -------------------------------------------------------------------------------- Problem List Details Patient Name: Date of Service: NA [name]. 08/29/2022 3:30 PM Medical Record Number: [id number] Patient Account Number: [id number] Date of Birth/Sex: Treating RN: Oct 27, 1955 (67 y.o. M) Primary Care [name]: [name] Other Clinician: Referring [name]: Treating [name]/Extender: [name] in Treatment: 8 Active Problems ICD-10 Encounter Code Description Active Date MDM Diagnosis L97.322 Non-pressure chronic ulcer of left ankle with fat layer exposed 07/04/2022 No Yes I73.9 Peripheral vascular disease, unspecified 07/04/2022 No Yes E11.622 Type 2 diabetes mellitus with other skin ulcer 07/04/2022 No Yes Inactive Problems Resolved Problems Electronic Signature(s) Signed: 08/29/2022 4:26:30 PM By: Colin Shan DO Entered By: [name] on 08/29/2022 16:17:57 -------------------------------------------------------------------------------- Progress Note Details Patient Name: Date of Service: NA [name]. 08/29/2022 3:30 PM Medical Record Number: [id number] Patient Account Number: [id number] Date of  Birth/Sex: Treating RN: 12/26/1955 (67 y.o. M) Primary Care [name]: [name] Other Clinician: Referring [name]: Treating [name]/Extender: [name] in Treatment: 8 Subjective Chief Complaint Information obtained from Patient Patients presents for treatment of an open diabetic ulcer with exposed bone and osteomyelitis 07/04/2022: Diabetic ankle ulcer History of Present Illness (HPI) ADMISSION 08/25/2021 This is [name] 67 year old man who initially presented to his primary care [name] in September 2022 with pain in his left foot. He was sent for an x-ray and while the x-ray was being performed, the tech pointed out [name] wound on  his foot that the patient was not aware existed. He does have type 2 diabetes with significant neuropathy. His diabetes is suboptimally controlled with his most recent A1c being 8.5. He also has [name] history of coronary artery disease status post three- vessel CABG. he was initially seen by orthopedics, but they referred him to [facility] podiatry. He has undergone at least 7 operations/debridements and several applications of skin substitute under the care of podiatry. He has been in [name] for much of this time. His most recent procedure was July 28, 2021. [name] was biopsied and was found to be consistent with osteomyelitis. Culture also returned positive for corynebacterium. He was seen on August 16, 2021 by infectious disease. [name] PICC line has been placed and he will be receiving [name] 6-week course of IV daptomycin and cefepime. In October 2022, he underwent lower extremity vascular studies. Results are copied here: [name], [name] ([id number]) [id number] Page 7 of 12 Right: Resting right ankle-brachial index is within normal range. No evidence of significant right lower extremity arterial disease. The right toe-brachial index is abnormal. Left: Resting left ankle-brachial index  indicates mild left lower extremity arterial disease. The left toe-brachial index is abnormal. He has not been seen by vascular surgery despite these findings. He presented to clinic today in [name] and is using [name] to offload. Wound VAC was in place. Once this was removed, [name] large ulcer was identified on the left midfoot/ankle. Bone is frankly exposed. There is no malodorous or purulent drainage. There is some granulation tissue over the central portion of the exposed bone. There is [name] tunnel that extends posteriorly for roughly 10 cm. It has been discussed with him by multiple providers that he is at very high risk of losing his lower leg because of this wound. He is extremely eager to avoid this outcome and is here today to review his options as well as receive ongoing wound care. 09/03/2021: Here for reevaluation of his wound. There does not appear to have been any substantial improvement overall since our last visit. He has been in [name] with white foam overlying the exposed bone. We are working on getting him approved for hyperbaric oxygen therapy. 09/10/2021: We are in the process of getting him cleared to begin hyperbaric oxygen therapy. He still needs to obtain [name] chest x-ray. Although the wound measurements are roughly the same, I think the overall appearance of the wound is better. The exposed bone has [name] bit more granulation tissue covering it. He has not received [name] vascular surgery appointment to reevaluate his flow to the wound. 09/17/2021: He has been approved for hyperbaric oxygen therapy and completed his chest x-ray, which I reviewed and it appears normal. The tunnels at the 12 and 10:00 positions are smaller. There is more granulation tissue covering the exposed bone and the undermining has decreased. He still has not received [name] vascular surgery appointment. 09/24/2021: He initiated hyperbaric oxygen therapy this week and is tolerating it well. He has an appointment  with vascular surgery coming up on May 16. The granulation tissue is covering more of the exposed bone and both tunnels are [name] bit smaller. 10/01/2021: He continues to tolerate hyperbaric oxygen therapy. He saw infectious disease and they are planning to pull his PICC line. He has been initiated on oral antibiotics (doxycycline and Augmentin). The wound looks about the same but the tunnels are [name] little bit smaller. The skin  seems to be contracting somewhat around the exposed bone. 10/08/2021: The wound is still about the same size, but the tunnels continue to come in and the skin is contracting around the exposed bone. He continues to have some accumulation of necrotic material in the inferoposterior aspect of the wound as well as accumulation at the 12:00 tunnel area. 10/15/2021: The wound is smaller today. The tunnels continue to come in. There is less necrotic tissue present. He does have some periwound maceration. 10/22/2021: The wound is about the same size. There is [name] little bit less undermining at the distal portion. The exposed bone is dark and I am not sure if this is staining from silver nitrate or his VAC sponge or if it represents necrosis. The tunnels are shallower but he does have some serous drainage coming from the 10:00 tunnel. He continues to tolerate hyperbaric oxygen therapy well. 10/29/2021: The undermining continues to improve. The tunnels are about the same. He has good granulation tissue overlying the majority of the exposed bone. It does appear that perhaps the tubing from his wound VAC has been eroding the skin at the 12 clock position. He continues to accumulate senescent epithelium around the borders of the wound. 11/05/2021: The undermining is almost completely resolved. The tunnels have contracted fairly significantly. No significant slough or debris accumulation. There is still senescent epithelium accumulation around the borders of the wound. He has been tolerating hyperbaric  oxygen therapy well. 11/12/2021: Despite the measurements of the wound being about the same, the wound has changed in its shape and overall, I think it is improved. The undermining has resolved and the tunnels continue to shorten. There is good granulation tissue encroaching over the small area of bone that has remained exposed at the 12 o'clock position. Minimal slough accumulation. He continues to tolerate hyperbaric oxygen therapy well. 11/19/2021: I took [name] PCR culture last week. There was overgrowth of yeast. He is already taking suppressive doxycycline and Augmentin. I added fluconazole to his regimen. The wound is smaller again today. The tunnels continue to shorten. He continues to do well with hyperbaric oxygen therapy. 11/26/2021: For some reason, his foot has become macerated. The wound is narrower but about the same dimensions in its longitudinal aspect. The tunnels continue to shorten. He has some slough buildup on the wound as well as some heaped up senescent epithelium around the perimeter. 12/03/2021: No further maceration of his foot has occurred. The wound has contracted quite significantly from last week. The tunnel at 10:00 is closed. The tunnel at 12:00 is down to just [name] couple of millimeters. No other undermining is present. There is soft tissue coverage of the previously exposed bone. There is just [name] bit of slough and biofilm on the wound surface. 12/10/2021: The wound is looking good. It turns out the tunnel at 12:00 is only exposed when the patient dorsiflexes his foot. It is about 2 cm in depth when he does this; when his foot is in plantarflexion, the tunnel is closed. The bone that was visible at the 12:00 tunnel is completely covered with granulation tissue, but there does feel like some exposed bone deeper into the tunnel area. There is senescent skin heaped up around the periphery. Minimal slough on the wound surface. 12/16/2021: The wound dimensions are roughly the same. The  surface has nice granulation tissue. The exposed bone at the 12:00 tunnel continues to be covered with more soft tissue. 7/14; patient's wound measures smaller today. Using the wound VAC with  underlying collagen. He is also being treated with HBO for underlying osteomyelitis. He tells me he is on doxycycline and ampicillin follows with infectious disease next week 12/31/2021: The wound continues to contract. Unfortunately, the area where the track pad and tubing have been rubbing continues to look like it is applying friction. He says that the home health nurses that have been applying the [facility] have been putting gauze underneath the tubing, but nonetheless there is ongoing tissue breakdown at this site. Light slough accumulation on the wound surface. The tunnel continues to contract. He is tolerating HBO without difficulty. 01/07/2022: Bridging the wound VAC away from the ankle has resulted in significant improvement in the tissue at the apex of the wound. The tunnel is still present and is not all that much shorter, but the overall wound surface is very robust and healthy looking. Minimal slough accumulation. No concern for acute infection. 01/21/2022: The wound continues to contract and has [name] robust granulation tissue surface. The tunnel has come in considerably and is down to about 1.4 cm. There is still bone exposed within the tunnel but the rest of it is well covered. There is some senescent epithelium at the wound margins and [name] little bit of slough on the surface. 01/28/2022: No significant change in the wound this week, but there has not been any reaccumulation of senescent epithelium or slough. The tunnel is perhaps [name] millimeter less in depth. He has been approved for Apligraf and we will apply this today. 02/04/2022: The wound has contracted somewhat and the tunnel has filled in completely. The wound surface is clean. He is here for Apligraf #2. [name], [name] ([id number])  [id number] Page 8 of 12 02/11/2022: The wound has contracted further and is now nearly flush with the surrounding skin surface. Light layer of slough. Apligraf #3 plan for today. 02/18/2022: There is [name] band of epithelium trying to cut across the superior portion of the wound. There is robust granulation tissue with just [name] light layer of slough and biofilm on the surface. There was [name] little bit of greenish drainage in the wound VAC but none appreciated on the site itself. He is here for Apligraf #4. 03/04/2022: The wound has contracted considerably. There is good granulation tissue on the surface. Minimal biofilm. He is here for Apligraf #5. 03/18/2022: The wound has epithelialized to the point that it has been divided into 2 areas. Both areas are smaller in total than at his previous visit. There is good granulation tissue present with just [name] little bit of periwound eschar and surface slough. 10/13; left medial foot. The patient has completed treatment with hyperbaric oxygen for underlying osteomyelitis he has had [name] nice response in the wound. Noted today that he still had some greenish drainage. Previously he has been treated with topical gentamicin but apparently that was stopped 2 [name] ago. Fortunately the wound is measuring smaller 10/20; wound looks better no hypergranulation minimal drainage. Granulation tissue looks healthy using gentamicin Hydrofera Blue under compression 04/08/2022: The wound continues to contract tremendously. The response to his treatment for hypertrophic granulation tissue has been quite remarkable. There are just [name] couple of open areas remaining. 04/18/2022: His wound is healed. READMISSION 07/04/2022: He returns to clinic with [name] new ulcer adjacent to where his previous large wound had been. He says that he was wearing boots and using [name] small padded dressing to protect the freshly healed ankle wound. Apparently the dressing rolled up and rubbed on  his skin, causing [name]  new wound. He contacted our office last week concerned about the appearance. There is [name] small oval wound on his left medial ankle. It is fairly clean with some periwound dry skin and eschar. 07/11/2022: The wound is about the same size this week. There is [name] layer of rubbery slough on the surface. It has [name] fairly strong odor. 07/18/2022: The wound measured larger this week due to small satellite areas opening in [name] fan pattern distal to the primary wound. He continues to accumulate slough on the wound surface. The culture that I took last week only grew out low levels of skin flora. 07/25/2022: The satellite areas that had opened last week have closed. The main wound is smaller with some slough accumulation. 08/01/2022: The wound continues to contract. There is [name] layer of slough on the surface. 08/08/2022: The wound is [name] little bit smaller again this week. Still with slough buildup. 3/18; patient presents for follow-up. He has been doing dressing changes on his own with silver alginate and antibiotic ointment. He has been at [employer] over the past 3 [name]. He has developed [name] new wound. Patient History Information obtained from Patient. Family History Cancer - Father, Diabetes - Father,Mother,Paternal Grandparents, Heart Disease - Father, Hypertension - Father, No family history of Hereditary Spherocytosis, Kidney Disease, Lung Disease, Seizures, Stroke, Thyroid Problems, Tuberculosis. Social History Never smoker, Marital Status - Married, Alcohol Use - Rarely, Drug Use - No History, Caffeine Use - Daily. Medical History Eyes Patient has history of Cataracts - Removed 2008 Cardiovascular Patient has history of Coronary Artery Disease, Hypertension, Myocardial Infarction, Peripheral Arterial Disease Endocrine Patient has history of Type II Diabetes Musculoskeletal Patient has history of Osteomyelitis Neurologic Patient has history of Neuropathy Medical [name] Surgical History  Notes nd Cardiovascular Hypercholesterolemia Abnormal EKG CABG X3 2019 Gastrointestinal GERD Musculoskeletal Diabetic foot ulcer Objective Constitutional respirations regular, non-labored and within target range for patient.. Vitals Time Taken: 3:32 PM, Height: 74 in, Weight: 187 lbs, BMI: 24, Temperature: 97.5 F, Pulse: 71 bpm, Respiratory Rate: 16 breaths/min, Blood Pressure: 192/75 mmHg. [name], [name] ([id number]) [id number] Page 9 of 12 Cardiovascular 2+ dorsalis pedis/posterior tibialis pulses. Psychiatric pleasant and cooperative. General Notes: 2 open wounds to the left ankle with nonviable tissue, slough and granulation tissue. No signs of surrounding infection. Integumentary (Hair, Skin) Wound #2 status is Open. Original cause of wound was Pressure Injury. The date acquired was: 06/13/2022. The wound has been in treatment 8 [name]. The wound is located on the Left,Medial Ankle. The wound measures 0.1cm length x 0.4cm width x 0.1cm depth; 0.031cm^2 area and 0.003cm^3 volume. There is Fat Layer (Subcutaneous Tissue) exposed. There is no tunneling or undermining noted. There is [name] medium amount of serous drainage noted. The wound margin is distinct with the outline attached to the wound base. There is large (67-100%) red, pink granulation within the wound bed. There is [name] small (1-33%) amount of necrotic tissue within the wound bed including Adherent Slough. The periwound skin appearance had no abnormalities noted for texture. The periwound skin appearance had no abnormalities noted for color. The periwound skin appearance exhibited: Dry/Scaly. The periwound skin appearance did not exhibit: Maceration. Periwound temperature was noted as No Abnormality. Wound #3 status is Open. Original cause of wound was Gradually Appeared. The date acquired was: 08/29/2022. The wound is located on the Left,Distal Ankle. The wound measures 0.5cm length x 0.3cm width x  0.3cm depth; 0.118cm^2 area and 0.035cm^3 volume. There is Fat Layer (Subcutaneous Tissue) exposed. There  is no tunneling or undermining noted. There is [name] medium amount of serous drainage noted. The wound margin is distinct with the outline attached to the wound base. There is large (67-100%) red, pink granulation within the wound bed. There is [name] small (1-33%) amount of necrotic tissue within the wound bed including Adherent Slough. The periwound skin appearance had no abnormalities noted for texture. The periwound skin appearance had no abnormalities noted for color. The periwound skin appearance exhibited: Maceration. Periwound temperature was noted as No Abnormality. Assessment Active Problems ICD-10 Non-pressure chronic ulcer of left ankle with fat layer exposed Peripheral vascular disease, unspecified Type 2 diabetes mellitus with other skin ulcer Patient was last seen 3 [name] ago. Since then he has been at [facility]. It is unclear how well he was able to offload this area. Unfortunately he has developed [name] new wound to the left ankle. No signs of infection. I debrided nonviable tissue and recommended continuing with antibiotic ointment and silver alginate. Continue aggressive offloading with knee scooter. Follow-up in 1 week. Procedures Wound #2 Pre-procedure diagnosis of Wound #2 is [name] Diabetic Wound/Ulcer of the Lower Extremity located on the Left,Medial Ankle .Severity of Tissue Pre Debridement is: Fat layer exposed. There was [name] Selective/Open Wound Skin/Epidermis Debridement with [name] total area of 0.04 sq cm performed by Colin Shan, DO. With the following instrument(s): Curette to remove Non-Viable tissue/material. Material removed includes Slough and Skin: Epidermis and. No specimens were taken. [name] time out was conducted at 15:56, prior to the start of the procedure. [name] Minimum amount of bleeding was controlled with Pressure. The procedure was tolerated well. Post Debridement  Measurements: 0.1cm length x 0.4cm width x 0.1cm depth; 0.003cm^3 volume. Character of Wound/Ulcer Post Debridement is improved. Severity of Tissue Post Debridement is: Fat layer exposed. Post procedure Diagnosis Wound #2: Same as Pre-Procedure General Notes: scribed for Dr. Heber [place] by Colin Peals, RN. Wound #3 Pre-procedure diagnosis of Wound #3 is [name] Diabetic Wound/Ulcer of the Lower Extremity located on the Left,Distal Ankle .Severity of Tissue Pre Debridement is: Fat layer exposed. There was [name] Excisional Skin/Subcutaneous Tissue Debridement with [name] total area of 0.15 sq cm performed by Colin Shan, DO. With the following instrument(s): Curette to remove Non-Viable tissue/material. Material removed includes Callus, Subcutaneous Tissue, and Slough. No specimens were taken. [name] time out was conducted at 15:56, prior to the start of the procedure. [name] Minimum amount of bleeding was controlled with Pressure. The procedure was tolerated well. Post Debridement Measurements: 0.5cm length x 0.3cm width x 0.3cm depth; 0.035cm^3 volume. Character of Wound/Ulcer Post Debridement is improved. Severity of Tissue Post Debridement is: Fat layer exposed. Post procedure Diagnosis Wound #3: Same as Pre-Procedure General Notes: scribed for Dr. Heber [place] by Colin Peals, RN. Plan Follow-up Appointments: Return Appointment in 1 week. - Dr. Celine Ahr - room 2 Bathing/ Shower/ Hygiene: May shower and wash wound with soap and water. Edema Control - Lymphedema / SCD / Other: Elevate legs to the level of the heart or above for 30 minutes daily and/or when sitting for 3-4 times [name] day throughout the day. Avoid standing for long periods of time. WOUND #2: - Ankle Wound Laterality: Left, Medial Cleanser: Normal Saline 1 x Per Day/30 Days Discharge Instructions: Cleanse the wound with Normal Saline prior to applying [name] clean dressing using gauze sponges, not tissue or cotton balls. Peri-Wound Care: Sween Lotion  (Moisturizing lotion) 1 x Per Day/30 Days Discharge Instructions: Apply moisturizing lotion as directed Topical: Mupirocin Ointment 1 x Per  Day/30 Days [name], [name] ([id number]) [phone].pdf Page 10 of 12 Discharge Instructions: Apply Mupirocin (Bactroban) as instructed Prim Dressing: Sorbalgon AG Dressing 2x2 (in/in) (Dispense As Written) 1 x Per Day/30 Days ary Discharge Instructions: Apply to wound bed as instructed Secondary Dressing: ABD Pad, 5x9 (Generic) 1 x Per Day/30 Days Discharge Instructions: Apply over primary dressing as directed. Secondary Dressing: Optifoam Non-Adhesive Dressing, 4x4 in 1 x Per Day/30 Days Discharge Instructions: Apply over primary dressing as directed. Secured With: Elastic Bandage 4 inch (ACE bandage) 1 x Per Day/30 Days Discharge Instructions: Secure with ACE bandage as directed. Secured With: [employer], 4.5x3.1 (in/yd) 1 x Per Day/30 Days Discharge Instructions: Secure with Kerlix as directed. Secured With: Transpore Surgical T ape, 2x10 (in/yd) 1 x Per Day/30 Days Discharge Instructions: Secure dressing with tape as directed. WOUND #3: - Ankle Wound Laterality: Left, Distal Cleanser: Normal Saline 1 x Per Day/30 Days Discharge Instructions: Cleanse the wound with Normal Saline prior to applying [name] clean dressing using gauze sponges, not tissue or cotton balls. Peri-Wound Care: Sween Lotion (Moisturizing lotion) 1 x Per Day/30 Days Discharge Instructions: Apply moisturizing lotion as directed Topical: Mupirocin Ointment 1 x Per Day/30 Days Discharge Instructions: Apply Mupirocin (Bactroban) as instructed Prim Dressing: [place] 2x2 (in/in) (Dispense As Written) 1 x Per Day/30 Days ary Discharge Instructions: Apply to wound bed as instructed Secondary Dressing: ABD Pad, 5x9 (Generic) 1 x Per Day/30 Days Discharge Instructions: Apply over primary dressing as directed. Secondary Dressing: Optifoam  Non-Adhesive Dressing, 4x4 in 1 x Per Day/30 Days Discharge Instructions: Apply over primary dressing as directed. Secured With: Elastic Bandage 4 inch (ACE bandage) 1 x Per Day/30 Days Discharge Instructions: Secure with ACE bandage as directed. Secured With: [employer], 4.5x3.1 (in/yd) 1 x Per Day/30 Days Discharge Instructions: Secure with Kerlix as directed. Secured With: Transpore Surgical T ape, 2x10 (in/yd) 1 x Per Day/30 Days Discharge Instructions: Secure dressing with tape as directed. 1. In office sharp debridement 2. Antibiotic ointment with silver alginate 3. Follow-up in 1 week Electronic Signature(s) Signed: 08/29/2022 4:26:30 PM By: Colin Shan DO Entered By: [name] on 08/29/2022 16:23:25 -------------------------------------------------------------------------------- HxROS Details Patient Name: Date of Service: NA Colin Delane Ginger, [name]. 08/29/2022 3:30 PM Medical Record Number: [id number] Patient Account Number: [id number] Date of Birth/Sex: Treating RN: Nov 02, 1955 (67 y.o. M) Primary Care [name]: [name] Other Clinician: Referring [name]: Treating [name]/Extender: [name] in Treatment: 8 Information Obtained From Patient Eyes Medical History: Positive for: Cataracts - Removed 2008 Cardiovascular Medical History: Positive for: Coronary Artery Disease; Hypertension; Myocardial Infarction; Peripheral Arterial Disease Past Medical History Notes: Hypercholesterolemia Abnormal EKG CABG X3 2019 Gastrointestinal Medical History: Past Medical History Notes: GERD Endocrine Medical History: Positive for: Type II Diabetes [name], [name] ([id number]) [phone].pdf Page 11 of 12 Time with diabetes: 24 years Treated with: Insulin, Oral agents Blood sugar tested every day: Yes Tested : Musculoskeletal Medical History: Positive for: Osteomyelitis Past Medical History Notes: Diabetic foot  ulcer Neurologic Medical History: Positive for: Neuropathy HBO Extended History Items Eyes: Cataracts Immunizations Pneumococcal Vaccine: Received Pneumococcal Vaccination: Yes Received Pneumococcal Vaccination On or After 60th Birthday: Yes Implantable Devices Yes Family and Social History Cancer: Yes - Father; Diabetes: Yes - Father,Mother,Paternal Grandparents; Heart Disease: Yes - Father; Hereditary Spherocytosis: No; Hypertension: Yes - Father; Kidney Disease: No; Lung Disease: No; Seizures: No; Stroke: No; Thyroid Problems: No; Tuberculosis: No; Never smoker; Marital Status - Married; Alcohol Use: Rarely; Drug Use:  No History; Caffeine Use: Daily; Financial Concerns: No; Food, Clothing or Shelter Needs: No; Support System Lacking: No; Transportation Concerns: No Electronic Signature(s) Signed: 08/29/2022 4:26:30 PM By: Colin Shan DO Entered By: [name] on 08/29/2022 16:18:58 -------------------------------------------------------------------------------- SuperBill Details Patient Name: Date of Service: NA [name]. 08/29/2022 Medical Record Number: [id number] Patient Account Number: [id number] Date of Birth/Sex: Treating RN: [birth year] (67 y.o. M) Primary Care [name]: [name] Other Clinician: Referring [name]: Treating [name]/Extender: [name] in Treatment: 8 Diagnosis Coding ICD-10 Codes Code Description [phone] Non-pressure chronic ulcer of left ankle with fat layer exposed I73.9 Peripheral vascular disease, unspecified E11.622 Type 2 diabetes mellitus with other skin ulcer Facility Procedures : CPT4 Code: [id number] Description: Brenda TISSUE 20 SQ CM/< ICD-10 Diagnosis Description L97.322 Non-pressure chronic ulcer of left ankle with fat layer exposed E11.622 Type 2 diabetes mellitus with other skin ulcer Modifier: Quantity: 1 : BRYLAN, LYNG Code: [id number] Bejamin [name] ([id number]) Description: [id number] -  DEBRIDE WOUND 1ST 20 SQ CM OR < ICD-10 Diagnosis Description L97.322 Non-pressure chronic ulcer of left ankle with fat layer exposed E11.622 Type 2 diabetes mellitus with other skin ulcer [phone] Modifier: [id number] Quantity: 1 .pdf Page 12 of 12 Physician Procedures : CPT4 Code Description Modifier [id number] - WC PHYS SUBQ TISS 20 SQ CM ICD-10 Diagnosis Description L97.322 Non-pressure chronic ulcer of left ankle with fat layer exposed E11.622 Type 2 diabetes mellitus with other skin ulcer Quantity: 1 : [id number] - WC PHYS DEBR WO ANESTH 20 SQ CM ICD-10 Diagnosis Description L97.322 Non-pressure chronic ulcer of left ankle with fat layer exposed E11.622 Type 2 diabetes mellitus with other skin ulcer Quantity: 1 Electronic Signature(s) Signed: 08/29/2022 4:26:30 PM By: Colin Shan DO Entered By: [name] on 08/29/2022 16:23:37

## 2022-08-30 NOTE — Progress Notes (Signed)
EWARD, HELOU A ([id number]) [id number] Page 1 of 9 Visit Report for 08/29/2022 Arrival Information Details Patient Name: Date of Service: NA [name] 08/29/2022 3:30 PM Medical Record Number: [id number] Patient Account Number: [id number] Date of Birth/Sex: Treating RN: 1956/04/01 (67 y.o. [name] Primary Care Daijanae Rafalski: [name] Other Clinician: Referring Jace Fermin: Treating Indyah Saulnier/Extender: Kaleen Odea Weeks in Treatment: 8 Visit Information History Since Last Visit Added or deleted any medications: No Patient Arrived: Knee Scooter Any new allergies or adverse reactions: No Arrival Time: 15:26 Had a fall or experienced change in No Accompanied By: self activities of daily living that may affect Transfer Assistance: Manual risk of falls: Patient Identification Verified: Yes Signs or symptoms of abuse/neglect since last visito No Secondary Verification Process Completed: Yes Hospitalized since last visit: No Patient Requires Transmission-Based Precautions: No Implantable device outside of the clinic excluding No Patient Has Alerts: No cellular tissue based products placed in the center since last visit: Has Dressing in Place as Prescribed: Yes Pain Present Now: No Electronic Signature(s) Signed: 08/29/2022 4:54:15 PM By: Adline Peals Entered By: Adline Peals on 08/29/2022 15:32:22 -------------------------------------------------------------------------------- Encounter Discharge Information Details Patient Name: Date of Service: NA [name] A. 08/29/2022 3:30 PM Medical Record Number: [id number] Patient Account Number: [id number] Date of Birth/Sex: Treating RN: 31-May-1956 (67 y.o. [name] Primary Care Quayshawn Nin: [name] Other Clinician: Referring Pelham Hennick: Treating Ronell Boldin/Extender: Kaleen Odea Weeks in Treatment: 8 Encounter Discharge Information Items Post  Procedure Vitals Discharge Condition: Stable Temperature (F): 97.5 Ambulatory Status: Knee Scooter Pulse (bpm): 71 Discharge Destination: Home Respiratory Rate (breaths/min): 16 Transportation: Private Auto Blood Pressure (mmHg): 192/75 Accompanied By: self Schedule Follow-up Appointment: Yes Clinical Summary of Care: Patient Declined Electronic Signature(s) Signed: 08/29/2022 4:54:15 PM By: Adline Peals Entered By: Adline Peals on 08/29/2022 16:13:54 -------------------------------------------------------------------------------- Lower Extremity Assessment Details Patient Name: Date of Service: [name] A. 08/29/2022 3:30 PM Medical Record Number: [id number] Patient Account Number: [id number] Date of Birth/Sex: Treating RN: 01/19/1956 (67 y.o. [name] Primary Care [name]: [name] Other Clinician: Referring [name]: Treating Britnie Colville/Extender: Kaleen Odea Weeks in Treatment: 8 Edema Assessment Assessed: [Left: No] [Right: No] N[LeftCRECENCIO, CUPPETT A ([id number] [[id number] Page 2 of 9] [Left: Edema] [Right: :] Calf Left: Right: Point of Measurement: From Medial Instep 35 cm Ankle Left: Right: Point of Measurement: From Medial Instep 24 cm Electronic Signature(s) Signed: 08/29/2022 4:54:15 PM By: Adline Peals Entered By: Adline Peals on 08/29/2022 15:34:18 -------------------------------------------------------------------------------- Multi Wound Chart Details Patient Name: Date of Service: NA [name] A. 08/29/2022 3:30 PM Medical Record Number: [id number] Patient Account Number: [id number] Date of Birth/Sex: Treating RN: [birth year] (67 y.o. M) Primary Care [name]: [name] Other Clinician: Referring Jarrod Mcenery: Treating Byrant Valent/Extender: Kaleen Odea Weeks in Treatment: 8 Vital Signs Height(in): 74 Pulse(bpm): 77 Weight(lbs): 187 Blood  Pressure(mmHg): 192/75 Body Mass Index(BMI): 24 Temperature(F): 97.5 Respiratory Rate(breaths/min): 16 [2:Photos:] [N/A:N/A] Left, Medial Ankle Left, Distal Ankle N/A Wound Location: Pressure Injury Gradually Appeared N/A Wounding Event: Diabetic Wound/Ulcer of the Lower Diabetic Wound/Ulcer of the Lower N/A Primary Etiology: Extremity Extremity Cataracts, Coronary Artery Disease, Cataracts, Coronary Artery Disease, N/A Comorbid History: Hypertension, Myocardial Infarction, Hypertension, Myocardial Infarction, Peripheral Arterial Disease, Type II Peripheral Arterial Disease, Type II Diabetes, Osteomyelitis, Neuropathy Diabetes, Osteomyelitis, Neuropathy 06/13/2022 08/29/2022 N/A Date Acquired: 8 0 N/A Weeks of Treatment: Open Open N/A Wound Status: No No N/A Wound Recurrence: 0.1x0.4x0.1 0.5x0.3x0.3 N/A  Measurements L x W x D (cm) 0.031 0.118 N/A A (cm) : rea 0.003 0.035 N/A Volume (cm) : 98.50% N/A N/A % Reduction in A rea: 98.50% N/A N/A % Reduction in Volume: Grade 1 Grade 1 N/A Classification: Medium Medium N/A Exudate A mount: Serous Serous N/A Exudate Type: amber amber N/A Exudate Color: Distinct, outline attached Distinct, outline attached N/A Wound Margin: Large (67-100%) Large (67-100%) N/A Granulation A mount: Red, Pink Red, Pink N/A Granulation Quality: Small (1-33%) Small (1-33%) N/A Necrotic A mount: Fat Layer (Subcutaneous Tissue): Yes Fat Layer (Subcutaneous Tissue): Yes N/A Exposed Structures: Fascia: No Fascia: No Tendon: No Tendon: No Muscle: No Muscle: No Joint: No Joint: No Bone: No Bone: No Large (67-100%) Small (1-33%) N/A Epithelialization: Debridement - Selective/Open Wound Debridement - Excisional N/A Debridement: GABRIAN, DUDDY A ([id number].pdf Page 3 of 9 15:56 15:56 N/A Pre-procedure Verification/Time Out Taken: [employer], Subcutaneous, Slough N/A Tissue  Debrided: Skin/Epidermis Skin/Subcutaneous Tissue N/A Level: 0.04 0.15 N/A Debridement A (sq cm): rea Curette Curette N/A Instrument: Minimum Minimum N/A Bleeding: Pressure Pressure N/A Hemostasis A chieved: Procedure was tolerated well Procedure was tolerated well N/A Debridement Treatment Response: 0.1x0.4x0.1 0.5x0.3x0.3 N/A Post Debridement Measurements L x W x D (cm) 0.003 0.035 N/A Post Debridement Volume: (cm) No Abnormalities Noted No Abnormalities Noted N/A Periwound Skin Texture: Dry/Scaly: Yes Maceration: Yes N/A Periwound Skin Moisture: Maceration: No No Abnormalities Noted No Abnormalities Noted N/A Periwound Skin Color: No Abnormality No Abnormality N/A Temperature: Debridement Debridement N/A Procedures Performed: Treatment Notes Wound #2 (Ankle) Wound Laterality: Left, Medial Cleanser Normal Saline Discharge Instruction: Cleanse the wound with Normal Saline prior to applying a clean dressing using gauze sponges, not tissue or cotton balls. Peri-Wound Care Sween Lotion (Moisturizing lotion) Discharge Instruction: Apply moisturizing lotion as directed Topical Mupirocin Ointment Discharge Instruction: Apply Mupirocin (Bactroban) as instructed Primary Dressing Sorbalgon AG Dressing 2x2 (in/in) Discharge Instruction: Apply to wound bed as instructed Secondary Dressing ABD Pad, 5x9 Discharge Instruction: Apply over primary dressing as directed. Optifoam Non-Adhesive Dressing, 4x4 in Discharge Instruction: Apply over primary dressing as directed. Secured With Elastic Bandage 4 inch (ACE bandage) Discharge Instruction: Secure with ACE bandage as directed. Kerlix Roll Sterile, 4.5x3.1 (in/yd) Discharge Instruction: Secure with Kerlix as directed. Transpore Surgical Tape, 2x10 (in/yd) Discharge Instruction: Secure dressing with tape as directed. Compression Wrap Compression Stockings Add-Ons Wound #3 (Ankle) Wound Laterality: Left,  Distal Cleanser Normal Saline Discharge Instruction: Cleanse the wound with Normal Saline prior to applying a clean dressing using gauze sponges, not tissue or cotton balls. Peri-Wound Care Sween Lotion (Moisturizing lotion) Discharge Instruction: Apply moisturizing lotion as directed Topical Mupirocin Ointment Discharge Instruction: Apply Mupirocin (Bactroban) as instructed Primary Dressing Sorbalgon AG Dressing 2x2 (in/in) Discharge Instruction: Apply to wound bed as instructed TRAVONNE, WAIGHT A ([id number] Page 4 of 9 Secondary Dressing ABD Pad, 5x9 Discharge Instruction: Apply over primary dressing as directed. Optifoam Non-Adhesive Dressing, 4x4 in Discharge Instruction: Apply over primary dressing as directed. Secured With Elastic Bandage 4 inch (ACE bandage) Discharge Instruction: Secure with ACE bandage as directed. Kerlix Roll Sterile, 4.5x3.1 (in/yd) Discharge Instruction: Secure with Kerlix as directed. Transpore Surgical Tape, 2x10 (in/yd) Discharge Instruction: Secure dressing with tape as directed. Compression Wrap Compression Stockings Add-Ons Electronic Signature(s) Signed: 08/29/2022 4:26:30 PM By: Kalman Shan DO Entered By: Kalman Shan on 08/29/2022 16:18:04 -------------------------------------------------------------------------------- Multi-Disciplinary Care Plan Details Patient Name: Date of Service: NA [name] A. 08/29/2022 3:30 PM Medical Record Number: [id number] Patient Account Number: [id number]  Date of Birth/Sex: Treating RN: [birth year] (67 y.o. [name] Primary Care Sriram Febles: [name] Other Clinician: Referring Shakira Los: Treating Nylene Inlow/Extender: Kaleen Odea Weeks in Treatment: 8 Active Inactive Pressure Nursing Diagnoses: Knowledge deficit related to causes and risk factors for pressure ulcer development Knowledge deficit related to management of pressures  ulcers Goals: Patient will remain free of pressure ulcers Date Initiated: 07/04/2022 Target Resolution Date: 09/09/2022 Goal Status: Active Interventions: Assess: immobility, friction, shearing, incontinence upon admission and as needed Assess potential for pressure ulcer upon admission and as needed Treatment Activities: Patient referred for pressure reduction/relief devices : 07/04/2022 Notes: Wound/Skin Impairment Nursing Diagnoses: Knowledge deficit related to smoking impact on wound healing Goals: Ulcer/skin breakdown will have a volume reduction of 30% by week 4 Date Initiated: 07/04/2022 Target Resolution Date: 09/09/2022 Goal Status: Active Interventions: Assess ulceration(s) every visit [place], HALFACRE A ([id number]) [id number].pdf Page 5 of 9 Provide education on ulcer and skin care Notes: Electronic Signature(s) Signed: 08/29/2022 4:54:15 PM By: Adline Peals Entered By: Adline Peals on 08/29/2022 16:12:39 -------------------------------------------------------------------------------- Pain Assessment Details Patient Name: Date of Service: [name] A. 08/29/2022 3:30 PM Medical Record Number: [id number] Patient Account Number: [id number] Date of Birth/Sex: Treating RN: 04-Mar-1956 (67 y.o. [name] Primary Care Alenna Russell: [name] Other Clinician: Referring Alga Southall: Treating Vieva Brummitt/Extender: Kaleen Odea Weeks in Treatment: 8 Active Problems Location of Pain Severity and Description of Pain Patient Has Paino No Site Locations Rate the pain. Current Pain Level: 0 Pain Management and Medication Current Pain Management: Electronic Signature(s) Signed: 08/29/2022 4:54:15 PM By: Adline Peals Entered By: Adline Peals on 08/29/2022 15:34:15 -------------------------------------------------------------------------------- Patient/Caregiver Education Details Patient Name: Date of  Service: NA [name] 3/18/2024andnbsp3:30 PM Medical Record Number: [id number] Patient Account Number: [id number] Date of Birth/Gender: Treating RN: 1956/05/06 (67 y.o. [name] Primary Care Physician: [name] Other Clinician: Referring Physician: Treating Physician/Extender: Erskine Squibb in Treatment: 8 Education Assessment Education Provided To: Patient Education Topics Provided Wound/Skin Impairment: Methods: Explain/Verbal Responses: Reinforcements needed, State content correctly GAVYNN, POLE A ([id number]) [id number] Page 6 of 9 Electronic Signature(s) Signed: 08/29/2022 4:54:15 PM By: Adline Peals Entered By: Adline Peals on 08/29/2022 16:12:51 -------------------------------------------------------------------------------- Wound Assessment Details Patient Name: Date of Service: [name] A. 08/29/2022 3:30 PM Medical Record Number: [id number] Patient Account Number: [id number] Date of Birth/Sex: Treating RN: 1955/12/31 (67 y.o. [name] Primary Care Rodrickus Min: [name] Other Clinician: Referring Rayhana Slider: Treating Sherleen Pangborn/Extender: Kaleen Odea Weeks in Treatment: 8 Wound Status Wound Number: 2 Primary Diabetic Wound/Ulcer of the Lower Extremity Etiology: Wound Location: Left, Medial Ankle Wound Open Wounding Event: Pressure Injury Status: Date Acquired: 06/13/2022 Comorbid Cataracts, Coronary Artery Disease, Hypertension, Myocardial Weeks Of Treatment: 8 History: Infarction, Peripheral Arterial Disease, Type II Diabetes, Clustered Wound: No Osteomyelitis, Neuropathy Photos Wound Measurements Length: (cm) 0.1 Width: (cm) 0.4 Depth: (cm) 0.1 Area: (cm) 0.031 Volume: (cm) 0.003 % Reduction in Area: 98.5% % Reduction in Volume: 98.5% Epithelialization: Large (67-100%) Tunneling: No Undermining: No Wound Description Classification: Grade  1 Wound Margin: Distinct, outline attached Exudate Amount: Medium Exudate Type: Serous Exudate Color: amber Foul Odor After Cleansing: No Slough/Fibrino Yes Wound Bed Granulation Amount: Large (67-100%) Exposed Structure Granulation Quality: Red, Pink Fascia Exposed: No Necrotic Amount: Small (1-33%) Fat Layer (Subcutaneous Tissue) Exposed: Yes Necrotic Quality: Adherent Slough Tendon Exposed: No Muscle Exposed: No Joint Exposed: No Bone Exposed: No Periwound Skin Texture Texture Color No Abnormalities  Noted: Yes No Abnormalities Noted: Yes Moisture Temperature / Pain No Abnormalities Noted: No Temperature: No Abnormality Dry / Scaly: Yes Maceration: No HAMER, BARBUSH A ([id number] Page 7 of 9 Treatment Notes Wound #2 (Ankle) Wound Laterality: Left, Medial Cleanser Normal Saline Discharge Instruction: Cleanse the wound with Normal Saline prior to applying a clean dressing using gauze sponges, not tissue or cotton balls. Peri-Wound Care Sween Lotion (Moisturizing lotion) Discharge Instruction: Apply moisturizing lotion as directed Topical Mupirocin Ointment Discharge Instruction: Apply Mupirocin (Bactroban) as instructed Primary Dressing Sorbalgon AG Dressing 2x2 (in/in) Discharge Instruction: Apply to wound bed as instructed Secondary Dressing ABD Pad, 5x9 Discharge Instruction: Apply over primary dressing as directed. Optifoam Non-Adhesive Dressing, 4x4 in Discharge Instruction: Apply over primary dressing as directed. Secured With Elastic Bandage 4 inch (ACE bandage) Discharge Instruction: Secure with ACE bandage as directed. Kerlix Roll Sterile, 4.5x3.1 (in/yd) Discharge Instruction: Secure with Kerlix as directed. Transpore Surgical Tape, 2x10 (in/yd) Discharge Instruction: Secure dressing with tape as directed. Compression Wrap Compression Stockings Add-Ons Electronic Signature(s) Signed: 08/29/2022 4:54:15 PM By:  Adline Peals Signed: 08/29/2022 5:42:59 PM By: [name] RN, BSN Entered By: [name] on 08/29/2022 15:40:02 -------------------------------------------------------------------------------- Wound Assessment Details Patient Name: Date of Service: NA [name] A. 08/29/2022 3:30 PM Medical Record Number: [id number] Patient Account Number: [id number] Date of Birth/Sex: Treating RN: 1956-03-15 (67 y.o. [name] Primary Care Nysha Koplin: [name] Other Clinician: Referring Mylz Yuan: Treating Trebor Galdamez/Extender: Kaleen Odea Weeks in Treatment: 8 Wound Status Wound Number: 3 Primary Diabetic Wound/Ulcer of the Lower Extremity Etiology: Wound Location: Left, Distal Ankle Wound Open Wounding Event: Gradually Appeared Status: Date Acquired: 08/29/2022 Comorbid Cataracts, Coronary Artery Disease, Hypertension, Myocardial Weeks Of Treatment: 0 History: Infarction, Peripheral Arterial Disease, Type II Diabetes, Clustered Wound: No Osteomyelitis, Neuropathy Photos GORAN, FINERAN A ([id number] Page 8 of 9 Wound Measurements Length: (cm) 0.5 Width: (cm) 0.3 Depth: (cm) 0.3 Area: (cm) 0.118 Volume: (cm) 0.035 % Reduction in Area: % Reduction in Volume: Epithelialization: Small (1-33%) Tunneling: No Undermining: No Wound Description Classification: Grade 1 Wound Margin: Distinct, outline attached Exudate Amount: Medium Exudate Type: Serous Exudate Color: amber Foul Odor After Cleansing: No Slough/Fibrino Yes Wound Bed Granulation Amount: Large (67-100%) Exposed Structure Granulation Quality: Red, Pink Fascia Exposed: No Necrotic Amount: Small (1-33%) Fat Layer (Subcutaneous Tissue) Exposed: Yes Necrotic Quality: Adherent Slough Tendon Exposed: No Muscle Exposed: No Joint Exposed: No Bone Exposed: No Periwound Skin Texture Texture Color No Abnormalities Noted: Yes No Abnormalities Noted:  Yes Moisture Temperature / Pain No Abnormalities Noted: No Temperature: No Abnormality Maceration: Yes Treatment Notes Wound #3 (Ankle) Wound Laterality: Left, Distal Cleanser Normal Saline Discharge Instruction: Cleanse the wound with Normal Saline prior to applying a clean dressing using gauze sponges, not tissue or cotton balls. Peri-Wound Care Sween Lotion (Moisturizing lotion) Discharge Instruction: Apply moisturizing lotion as directed Topical Mupirocin Ointment Discharge Instruction: Apply Mupirocin (Bactroban) as instructed Primary Dressing Sorbalgon AG Dressing 2x2 (in/in) Discharge Instruction: Apply to wound bed as instructed Secondary Dressing ABD Pad, 5x9 Discharge Instruction: Apply over primary dressing as directed. Optifoam Non-Adhesive Dressing, 4x4 in Discharge Instruction: Apply over primary dressing as directed. Secured With Elastic Bandage 4 inch (ACE bandage) Discharge Instruction: Secure with ACE bandage as directed. REEGAN, KOIS A ([id number]) [id number] Page 9 of 9 Kerlix Roll Sterile, 4.5x3.1 (in/yd) Discharge Instruction: Secure with Kerlix as directed. Transpore Surgical Tape, 2x10 (in/yd) Discharge Instruction: Secure dressing with tape as directed. Compression Wrap Compression Stockings Add-Ons Electronic  Signature(s) Signed: 08/29/2022 4:54:15 PM By: Adline Peals Signed: 08/29/2022 5:42:59 PM By: [name] RN, BSN Entered By: [name] on 08/29/2022 15:40:36 -------------------------------------------------------------------------------- Vitals Details Patient Name: Date of Service: NA [name] A. 08/29/2022 3:30 PM Medical Record Number: [id number] Patient Account Number: [id number] Date of Birth/Sex: Treating RN: [birth year] (67 y.o. [name] Primary Care Haruka Kowaleski: [name] Other Clinician: Referring Deina Lipsey: Treating [place]/Extender: Kaleen Odea Weeks in  Treatment: 8 Vital Signs Time Taken: 15:32 Temperature (F): 97.5 Height (in): 74 Pulse (bpm): 71 Weight (lbs): 187 Respiratory Rate (breaths/min): 16 Body Mass Index (BMI): 24 Blood Pressure (mmHg): 192/75 Reference Range: 80 - 120 mg / dl Electronic Signature(s) Signed: 08/29/2022 4:54:15 PM By: Adline Peals Entered By: Adline Peals on 08/29/2022 15:34:02

## 2022-09-05 ENCOUNTER — Encounter (HOSPITAL_BASED_OUTPATIENT_CLINIC_OR_DEPARTMENT_OTHER): Payer: BC Managed Care – PPO | Admitting: General Surgery

## 2022-09-05 DIAGNOSIS — E1140 Type 2 diabetes mellitus with diabetic neuropathy, unspecified: Secondary | ICD-10-CM | POA: Diagnosis not present

## 2022-09-05 DIAGNOSIS — I2510 Atherosclerotic heart disease of native coronary artery without angina pectoris: Secondary | ICD-10-CM | POA: Diagnosis not present

## 2022-09-05 DIAGNOSIS — L97322 Non-pressure chronic ulcer of left ankle with fat layer exposed: Secondary | ICD-10-CM | POA: Diagnosis not present

## 2022-09-05 DIAGNOSIS — Z951 Presence of aortocoronary bypass graft: Secondary | ICD-10-CM | POA: Diagnosis not present

## 2022-09-05 DIAGNOSIS — I739 Peripheral vascular disease, unspecified: Secondary | ICD-10-CM | POA: Diagnosis not present

## 2022-09-05 DIAGNOSIS — E11622 Type 2 diabetes mellitus with other skin ulcer: Secondary | ICD-10-CM | POA: Diagnosis not present

## 2022-09-05 NOTE — Progress Notes (Signed)
JULEN, TU [name] ([id number]) [id number] Page 1 of 8 Visit Report for 09/05/2022 Arrival Information Details Patient Name: Date of Service: NA [name] 09/05/2022 4:15 PM Medical Record Number: [id number] Patient Account Number: [id number] Date of Birth/Sex: Treating RN: 1955-10-28 (67 y.o. [name] Primary Care [name]: [name] Other Clinician: Referring [name]: Treating [name]/Extender: [name] in Treatment: 9 Visit Information History Since Last Visit Added or deleted any medications: No Patient Arrived: Knee Scooter Any new allergies or adverse reactions: No Arrival Time: 16:14 Had [name] fall or experienced change in No Accompanied By: self activities of daily living that may affect Transfer Assistance: None risk of falls: Patient Identification Verified: Yes Signs or symptoms of abuse/neglect since last No Secondary Verification Process Completed: Yes visito Patient Requires Transmission-Based Precautions: No Hospitalized since last visit: No Patient Has Alerts: No Implantable device outside of the clinic No excluding cellular tissue based products placed in the center since last visit: Has Dressing in Place as Prescribed: Yes Has Footwear/Offloading in Place as Yes Prescribed: Left: Removable Cast Walker/Walking Boot Pain Present Now: No Electronic Signature(s) Signed: 09/05/2022 4:40:03 PM By: Colin Pilling RN, BSN Entered By: [name] on 09/05/2022 16:15:13 -------------------------------------------------------------------------------- Encounter Discharge Information Details Patient Name: Date of Service: NA [name]. 09/05/2022 4:15 PM Medical Record Number: [id number] Patient Account Number: [id number] Date of Birth/Sex: Treating RN: 18-Sep-1955 (67 y.o. [name] Primary Care [name]: [name] Other Clinician: Referring [name]: Treating Shabrea Weldin/Extender: [name] in Treatment: 9 Encounter Discharge Information Items Post Procedure Vitals Discharge Condition: Stable Temperature (F): 97.6 Ambulatory Status: Knee Scooter Pulse (bpm): 77 Discharge Destination: Home Respiratory Rate (breaths/min): 18 Transportation: Private Auto Blood Pressure (mmHg): 164/97 Accompanied By: self Schedule Follow-up Appointment: Yes Clinical Summary of Care: Patient Declined Electronic Signature(s) Signed: 09/05/2022 6:04:53 PM By: Colin Gouty RN, BSN Entered By: [name] on 09/05/2022 17:23:43 -------------------------------------------------------------------------------- Lower Extremity Assessment Details Patient Name: Date of Service: NA [name]. 09/05/2022 4:15 PM Medical Record Number: [id number] Patient Account Number: [id number] Date of Birth/Sex: Treating RN: 1955/12/14 (67 y.o. [name] Primary Care [name]: [name] Other Clinician: Referring Anaisa Radi: Treating [name]/Extender: [name] ([id number]) [id number] Page 2 of 8 Weeks in Treatment: 9 Edema Assessment Assessed: [Left: Yes] [Right: No] Edema: [Left: Ye] [Right: s] Calf Left: Right: Point of Measurement: From Medial Instep 36 cm Ankle Left: Right: Point of Measurement: From Medial Instep 24 cm Vascular Assessment Pulses: Dorsalis Pedis Palpable: [Left:Yes] Electronic Signature(s) Signed: 09/05/2022 4:40:03 PM By: Colin Pilling RN, BSN Entered By: [name] on 09/05/2022 16:16:39 -------------------------------------------------------------------------------- Multi Wound Chart Details Patient Name: Date of Service: NA [name]. 09/05/2022 4:15 PM Medical Record Number: [id number] Patient Account Number: [id number] Date of Birth/Sex: Treating RN: [birth year] (67 y.o. M) Primary Care [name]: [name] Other Clinician: Referring Zarahi Fuerst: Treating  [name]/Extender: [name] Weeks in Treatment: 9 Vital Signs Height(in): 74 Pulse(bpm): 44 Weight(lbs): 187 Blood Pressure(mmHg): 164/97 Body Mass Index(BMI): 24 Temperature(F): 97.6 Respiratory Rate(breaths/min): 20 [2:Photos:] [N/[name]:N/[name]] Left, Medial Ankle Left, Distal Ankle N/[name] Wound Location: Pressure Injury Gradually Appeared N/[name] Wounding Event: Diabetic Wound/Ulcer of the Lower Diabetic Wound/Ulcer of the Lower N/[name] Primary Etiology: Extremity Extremity Cataracts, Coronary Artery Disease, Cataracts, Coronary Artery Disease, N/[name] Comorbid History: Hypertension, Myocardial Infarction, Hypertension, Myocardial Infarction, Peripheral Arterial Disease, Type II Peripheral Arterial Disease, Type II Diabetes, Osteomyelitis, Neuropathy Diabetes, Osteomyelitis, Neuropathy  06/13/2022 08/29/2022 N/[name] Date Acquired: 9 1 N/[name] Weeks of Treatment: Open Open N/[name] Wound Status: No No N/[name] Wound Recurrence: 0.5x0.4x0.1 2.8x1x0.3 N/[name] Measurements L x W x D (cm) 0.157 2.199 N/[name] [name] (cm) : rea 0.016 0.66 N/[name] Volume (cm) : 92.30% -1763.60% N/[name] % Reduction in [name] rea: 92.20% -1785.70% N/[name] % Reduction in Volume: Grade 3 Grade 3 N/[name] Classification: MRI MRI N/[name] [name] VerificationGADGE, GRGURICH [name] ([id number].pdf Page 3 of 8 Medium Medium N/[name] Exudate Amount: Serous Serosanguineous N/[name] Exudate Type: amber red, brown N/[name] Exudate Color: Distinct, outline attached Distinct, outline attached N/[name] Wound Margin: Large (67-100%) Large (67-100%) N/[name] Granulation Amount: Pink, Pale Red, Pink N/[name] Granulation Quality: None Present (0%) None Present (0%) N/[name] Necrotic Amount: Fat Layer (Subcutaneous Tissue): Yes Fat Layer (Subcutaneous Tissue): Yes N/[name] Exposed Structures: Fascia: No Fascia: No Tendon: No Tendon: No Muscle: No Muscle: No Joint: No Joint: No Bone: No Bone: No Large (67-100%) Small (1-33%)  N/[name] Epithelialization: Debridement - Selective/Open Wound Debridement - Selective/Open Wound N/[name] Debridement: Pre-procedure Verification/Time Out 16:30 16:30 N/[name] Taken: Lidocaine 4% Topical Solution Lidocaine 4% Topical Solution N/[name] Pain Control: Necrotic/Eschar, Psychologist, prison and probation services, Slough N/[name] Tissue Debrided: Non-Viable Tissue Skin/Epidermis N/[name] Level: 0.25 2.8 N/[name] Debridement [name] (sq cm): rea Curette Curette N/[name] Instrument: Minimum Minimum N/[name] Bleeding: Pressure Pressure N/[name] Hemostasis [name] chieved: 0 0 N/[name] Procedural Pain: 0 0 N/[name] Post Procedural Pain: Procedure was tolerated well Procedure was tolerated well N/[name] Debridement Treatment Response: 0.5x0.4x0.1 2.8x1x0.1 N/[name] Post Debridement Measurements L x W x D (cm) 0.016 0.22 N/[name] Post Debridement Volume: (cm) Callus: Yes Callus: Yes N/[name] Periwound Skin Texture: Dry/Scaly: Yes Maceration: No N/[name] Periwound Skin Moisture: Maceration: No No Abnormalities Noted No Abnormalities Noted N/[name] Periwound Skin Color: No Abnormality No Abnormality N/[name] Temperature: Debridement Debridement N/[name] Procedures Performed: Treatment Notes Electronic Signature(s) Signed: 09/05/2022 4:51:11 PM By: [name] MD FACS Entered By: [name] on 09/05/2022 16:51:11 -------------------------------------------------------------------------------- Multi-Disciplinary Care Plan Details Patient Name: Date of Service: Colin Loveless [name]. 09/05/2022 4:15 PM Medical Record Number: [id number] Patient Account Number: [id number] Date of Birth/Sex: Treating RN: [birth year] (67 y.o. [name] Primary Care Zaelyn Barbary: [name] Other Clinician: Referring Easter Kennebrew: Treating Chima Astorino/Extender: [name] Weeks in Treatment: 9 Active Inactive Pressure Nursing Diagnoses: Knowledge deficit related to causes and risk factors for pressure ulcer development Knowledge deficit related to management of pressures  ulcers Goals: Patient will remain free of pressure ulcers Date Initiated: 07/04/2022 Target Resolution Date: 09/09/2022 Goal Status: Active Interventions: Assess: immobility, friction, shearing, incontinence upon admission and as needed Assess potential for pressure ulcer upon admission and as needed Treatment Activities: Patient referred for pressure reduction/relief devices : 07/04/2022 Notes: LASHAD, PALLANES ([id number]) [phone].pdf Page 4 of 8 Wound/Skin Impairment Nursing Diagnoses: Knowledge deficit related to smoking impact on wound healing Goals: Ulcer/skin breakdown will have [name] volume reduction of 30% by week 4 Date Initiated: 07/04/2022 Target Resolution Date: 09/09/2022 Goal Status: Active Interventions: Assess ulceration(s) every visit Provide education on ulcer and skin care Notes: Electronic Signature(s) Signed: 09/05/2022 4:40:03 PM By: Colin Pilling RN, BSN Entered By: [name] on 09/05/2022 16:27:55 -------------------------------------------------------------------------------- Pain Assessment Details Patient Name: Date of Service: NA [name]. 09/05/2022 4:15 PM Medical Record Number: [id number] Patient Account Number: [id number] Date of Birth/Sex: Treating RN: 12-23-1955 (67 y.o. [name] Primary Care Manpreet Kemmer: [name] Other Clinician: Referring Keylen Uzelac: Treating Ashtyn Freilich/Extender: [name] Weeks in Treatment: 9 Active Problems Location of Pain  Severity and Description of Pain Patient Has Paino No Site Locations Pain Management and Medication Current Pain Management: Electronic Signature(s) Signed: 09/05/2022 4:40:03 PM By: Colin Pilling RN, BSN Entered By: [name] on 09/05/2022 16:15:31 -------------------------------------------------------------------------------- Patient/Caregiver Education Details Patient Name: Date of Service: NA [name] 3/25/2024andnbsp4:15  PM Medical Record Number: [id number] Patient Account Number: [id number] Date of Birth/Gender: Treating RN: Aug 02, 1955 (67 y.o. [name] Primary Care Physician: [name] Other Clinician: Referring Physician: Treating Physician/Extender: [name] in Treatment: [address], Damien [name] ([id number]) [id number] Page 5 of 8 Education Assessment Education Provided To: Patient Education Topics Provided Wound/Skin Impairment: Handouts: Caring for Your Ulcer Methods: Explain/Verbal Responses: Reinforcements needed Electronic Signature(s) Signed: 09/05/2022 4:40:03 PM By: Colin Pilling RN, BSN Entered By: [name] on 09/05/2022 16:28:08 -------------------------------------------------------------------------------- Wound Assessment Details Patient Name: Date of Service: NA [name]. 09/05/2022 4:15 PM Medical Record Number: [id number] Patient Account Number: [id number] Date of Birth/Sex: Treating RN: 04/14/1956 (67 y.o. [name], [name] Primary Care Ethal Gotay: [name] Other Clinician: Referring Donnis Phaneuf: Treating Lyndell Gillyard/Extender: [name] Weeks in Treatment: 9 Wound Status Wound Number: 2 Primary Diabetic Wound/Ulcer of the Lower Extremity Etiology: Wound Location: Left, Medial Ankle Wound Open Wounding Event: Pressure Injury Status: Date Acquired: 06/13/2022 Comorbid Cataracts, Coronary Artery Disease, Hypertension, Myocardial Weeks Of Treatment: 9 History: Infarction, Peripheral Arterial Disease, Type II Diabetes, Clustered Wound: No Osteomyelitis, Neuropathy Photos Wound Measurements Length: (cm) 0.5 Width: (cm) 0.4 Depth: (cm) 0.1 Area: (cm) 0.157 Volume: (cm) 0.016 % Reduction in Area: 92.3% % Reduction in Volume: 92.2% Epithelialization: Large (67-100%) Tunneling: No Undermining: No Wound Description Classification: Grade 3 Wagner Verification: MRI Wound Margin: Distinct,  outline attached Exudate Amount: Medium Exudate Type: Serous Exudate Color: amber Foul Odor After Cleansing: No Slough/Fibrino No Wound Bed Granulation Amount: Large (67-100%) Exposed Structure Granulation Quality: Pink, Pale Fascia Exposed: No Necrotic Amount: None Present (0%) Fat Layer (Subcutaneous Tissue) Exposed: Yes Tendon Exposed: No [name], [name] ([id number].pdf Page 6 of 8 Muscle Exposed: No Joint Exposed: No Bone Exposed: No Periwound Skin Texture Texture Color No Abnormalities Noted: No No Abnormalities Noted: Yes Callus: Yes Temperature / Pain Temperature: No Abnormality Moisture No Abnormalities Noted: No Dry / Scaly: Yes Maceration: No Treatment Notes Wound #2 (Ankle) Wound Laterality: Left, Medial Cleanser Normal Saline Discharge Instruction: Cleanse the wound with Normal Saline prior to applying [name] clean dressing using gauze sponges, not tissue or cotton balls. Peri-Wound Care Sween Lotion (Moisturizing lotion) Discharge Instruction: Apply moisturizing lotion as directed Topical Mupirocin Ointment Discharge Instruction: Apply Mupirocin (Bactroban) as instructed Primary Dressing Sorbalgon AG Dressing 2x2 (in/in) Discharge Instruction: Apply to wound bed as instructed Secondary Dressing ABD Pad, 5x9 Discharge Instruction: Apply over primary dressing as directed. Optifoam Non-Adhesive Dressing, 4x4 in Discharge Instruction: Apply over primary dressing as directed. Secured With Elastic Bandage 4 inch (ACE bandage) Discharge Instruction: Secure with ACE bandage as directed. Kerlix Roll Sterile, 4.5x3.1 (in/yd) Discharge Instruction: Secure with Kerlix as directed. Transpore Surgical Tape, 2x10 (in/yd) Discharge Instruction: Secure dressing with tape as directed. Compression Wrap Compression Stockings Add-Ons Electronic Signature(s) Signed: 09/05/2022 4:40:03 PM By: Colin Pilling RN, BSN Entered By: [name]  on 09/05/2022 16:25:54 -------------------------------------------------------------------------------- Wound Assessment Details Patient Name: Date of Service: NA [name]. 09/05/2022 4:15 PM Medical Record Number: [id number] Patient Account Number: [id number] Date of Birth/Sex: Treating RN: April 12, 1956 (67 y.o. [name] Primary Care Denim Kalmbach: [name] Other Clinician: Referring  Maylynn Orzechowski: Treating Trenita Hulme/Extender: [name] Weeks in Treatment: 9 Wound Status Wound Number: 3 Primary Diabetic Wound/Ulcer of the Lower Extremity Etiology: Wound Location: Left, Distal Ankle Wound Open Wounding Event: Gradually Appeared StatusMAXENCE, [name] ([id number].pdf Page 7 of 8 Status: Date Acquired: 08/29/2022 Comorbid Cataracts, Coronary Artery Disease, Hypertension, Myocardial Weeks Of Treatment: 1 History: Infarction, Peripheral Arterial Disease, Type II Diabetes, Clustered Wound: No Osteomyelitis, Neuropathy Photos Wound Measurements Length: (cm) 2.8 Width: (cm) 1 Depth: (cm) 0.3 Area: (cm) 2.199 Volume: (cm) 0.66 % Reduction in Area: -1763.6% % Reduction in Volume: -1785.7% Epithelialization: Small (1-33%) Tunneling: No Undermining: No Wound Description Classification: Grade 3 Wagner Verification: MRI Wound Margin: Distinct, outline attached Exudate Amount: Medium Exudate Type: Serosanguineous Exudate Color: red, brown Foul Odor After Cleansing: No Slough/Fibrino No Wound Bed Granulation Amount: Large (67-100%) Exposed Structure Granulation Quality: Red, Pink Fascia Exposed: No Necrotic Amount: None Present (0%) Fat Layer (Subcutaneous Tissue) Exposed: Yes Tendon Exposed: No Muscle Exposed: No Joint Exposed: No Bone Exposed: No Periwound Skin Texture Texture Color No Abnormalities Noted: No No Abnormalities Noted: Yes Callus: Yes Temperature / Pain Temperature: No Abnormality Moisture No  Abnormalities Noted: No Maceration: No Treatment Notes Wound #3 (Ankle) Wound Laterality: Left, Distal Cleanser Normal Saline Discharge Instruction: Cleanse the wound with Normal Saline prior to applying [name] clean dressing using gauze sponges, not tissue or cotton balls. Peri-Wound Care Sween Lotion (Moisturizing lotion) Discharge Instruction: Apply moisturizing lotion as directed Topical Mupirocin Ointment Discharge Instruction: Apply Mupirocin (Bactroban) as instructed Primary Dressing Sorbalgon AG Dressing 2x2 (in/in) Discharge Instruction: Apply to wound bed as instructed Secondary Dressing ABD Pad, 5x9 Discharge Instruction: Apply over primary dressing as directed. [name], [name] ([id number]) [id number] Page 8 of 8 Optifoam Non-Adhesive Dressing, 4x4 in Discharge Instruction: Apply over primary dressing as directed. Secured With Elastic Bandage 4 inch (ACE bandage) Discharge Instruction: Secure with ACE bandage as directed. Kerlix Roll Sterile, 4.5x3.1 (in/yd) Discharge Instruction: Secure with Kerlix as directed. Transpore Surgical Tape, 2x10 (in/yd) Discharge Instruction: Secure dressing with tape as directed. Compression Wrap Compression Stockings Add-Ons Electronic Signature(s) Signed: 09/05/2022 4:40:03 PM By: Colin Pilling RN, BSN Entered By: [name] on 09/05/2022 16:26:07 -------------------------------------------------------------------------------- Vitals Details Patient Name: Date of Service: NA [name]. 09/05/2022 4:15 PM Medical Record Number: [id number] Patient Account Number: [id number] Date of Birth/Sex: Treating RN: 27-Aug-1955 (67 y.o. [name] Primary Care Rakim Moone: [name] Other Clinician: Referring Keilany Burnette: Treating Siyah Mault/Extender: [name] Weeks in Treatment: 9 Vital Signs Time Taken: 16:15 Temperature (F): 97.6 Height (in): 74 Pulse (bpm): 77 Weight (lbs):  187 Respiratory Rate (breaths/min): 20 Body Mass Index (BMI): 24 Blood Pressure (mmHg): 164/97 Reference Range: 80 - 120 mg / dl Electronic Signature(s) Signed: 09/05/2022 4:40:03 PM By: Colin Pilling RN, BSN Entered By: [name] on 09/05/2022 16:15:26

## 2022-09-06 NOTE — Progress Notes (Signed)
[name], [name] [name] ([id number]) [id number] Page 1 of 12 Visit Report for 09/05/2022 Chief Complaint Document Details Patient Name: Date of Service: [name] 09/05/2022 4:15 PM Medical Record Number: [id number] Patient Account Number: [id number] Date of Birth/Sex: Treating RN: [birth year] (67 y.o. M) Primary Care Provider: Cher [name] Other Clinician: Referring Provider: Treating Provider/Extender: [name] in Treatment: 9 Information Obtained from: Patient Chief Complaint Patients presents for treatment of an open diabetic ulcer with exposed bone and osteomyelitis 07/04/2022: Diabetic ankle ulcer Electronic Signature(s) Signed: 09/05/2022 4:51:20 PM By: Colin Maudlin MD FACS Entered By: [name] on 09/05/2022 16:51:20 -------------------------------------------------------------------------------- Debridement Details Patient Name: Date of Service: NA [name]. 09/05/2022 4:15 PM Medical Record Number: [id number] Patient Account Number: [id number] Date of Birth/Sex: Treating RN: April 22, 1956 (67 y.o. [name] Primary Care Provider: Cher [name] Other Clinician: Referring Provider: Treating Provider/Extender: [name] in Treatment: 9 Debridement Performed for Assessment: Wound #2 Left,Medial Ankle Performed By: Physician Colin Maudlin, MD Debridement Type: Debridement Severity of Tissue Pre Debridement: Fat layer exposed Level of Consciousness (Pre-procedure): Awake and Alert Pre-procedure Verification/Time Out Yes - 16:30 Taken: Start Time: 16:30 Pain Control: Lidocaine 4% T opical Solution T Area Debrided (L x W): otal 0.5 (cm) x 0.5 (cm) = 0.25 (cm) Tissue and other material debrided: Non-Viable, Eschar, Slough, Slough Level: Non-Viable Tissue Debridement Description: Selective/Open Wound Instrument: Curette Bleeding: Minimum Hemostasis Achieved:  Pressure Procedural Pain: 0 Post Procedural Pain: 0 Response to Treatment: Procedure was tolerated well Level of Consciousness (Post- Awake and Alert procedure): Post Debridement Measurements of Total Wound Length: (cm) 0.5 Width: (cm) 0.4 Depth: (cm) 0.1 Volume: (cm) 0.016 Character of Wound/Ulcer Post Debridement: Improved Severity of Tissue Post Debridement: Fat layer exposed Post Procedure Diagnosis Same as Pre-procedure Notes scribed for Dr. Celine [name] by Colin Gouty, RN Electronic Signature(s) [name] ([id number]) [phone].pdf Page 2 of 12 Signed: 09/05/2022 5:12:23 PM By: Colin Maudlin MD FACS Signed: 09/05/2022 6:04:53 PM By: Colin Gouty RN, BSN Entered By: [name] on 09/05/2022 16:35:27 -------------------------------------------------------------------------------- Debridement Details Patient Name: Date of Service: NA [name]. 09/05/2022 4:15 PM Medical Record Number: [id number] Patient Account Number: [id number] Date of Birth/Sex: Treating RN: 19-Jun-1955 (67 y.o. [name] Primary Care Provider: Cher [name] Other Clinician: Referring Provider: Treating Provider/Extender: [name] in Treatment: 9 Debridement Performed for Assessment: Wound #3 Left,Distal Ankle Performed By: Physician Colin Maudlin, MD Debridement Type: Debridement Severity of Tissue Pre Debridement: Fat layer exposed Level of Consciousness (Pre-procedure): Awake and Alert Pre-procedure Verification/Time Out Yes - 16:30 Taken: Start Time: 16:30 Pain Control: Lidocaine 4% T opical Solution T Area Debrided (L x W): otal 2.8 (cm) x 1 (cm) = 2.8 (cm) Tissue and other material debrided: Non-Viable, Eschar, Slough, Skin: Epidermis, Slough Level: Skin/Epidermis Debridement Description: Selective/Open Wound Instrument: Curette Bleeding: Minimum Hemostasis Achieved: Pressure Procedural Pain: 0 Post  Procedural Pain: 0 Response to Treatment: Procedure was tolerated well Level of Consciousness (Post- Awake and Alert procedure): Post Debridement Measurements of Total Wound Length: (cm) 2.8 Width: (cm) 1 Depth: (cm) 0.1 Volume: (cm) 0.22 Character of Wound/Ulcer Post Debridement: Improved Severity of Tissue Post Debridement: Fat layer exposed Post Procedure Diagnosis Same as Pre-procedure Notes scribed for Dr. Celine [name] by Colin Gouty, RN Electronic Signature(s) Signed: 09/05/2022 5:12:23 PM By: Colin Maudlin MD FACS Signed: 09/05/2022 6:04:53 PM By: Colin Gouty RN, BSN Entered By: [name] on 09/05/2022 16:36:42 -------------------------------------------------------------------------------- HPI Details  Patient Name: Date of Service: [name] 09/05/2022 4:15 PM Medical Record Number: [id number] Patient Account Number: [id number] Date of Birth/Sex: Treating RN: 12-08-1955 (67 y.o. M) Primary Care Provider: Cher [name] Other Clinician: Referring Provider: Treating Provider/Extender: [name] in Treatment: 9 History of Present Illness HPI Description: ADMISSION 08/25/2021 This is [name] 67 year old man who initially presented to his primary care provider in September 2022 with pain in his left foot. He was sent for an x-ray and while the x-ray was being performed, the tech pointed out [name] wound on his foot that the patient was not aware existed. He does have type 2 diabetes with significant [name] ([id number]) [id number].pdf Page 3 of 12 neuropathy. His diabetes is suboptimally controlled with his most recent A1c being 8.5. He also has [name] history of coronary artery disease status post three- vessel CABG. he was initially seen by orthopedics, but they referred him to [facility]. He has undergone at least 7 operations/debridements and several applications of skin substitute under the care of  podiatry. He has been in [name] for much of this time. His most recent procedure was July 28, 2021. [name] was biopsied and was found to be consistent with osteomyelitis. Culture also returned positive for corynebacterium. He was seen on August 16, 2021 by infectious disease. [name] PICC line has been placed and he will be receiving [name] 6-week course of IV daptomycin and cefepime. In October 2022, he underwent lower extremity vascular studies. Results are copied here: Right: Resting right ankle-brachial index is within normal range. No evidence of significant right lower extremity arterial disease. The right toe-brachial index is abnormal. Left: Resting left ankle-brachial index indicates mild left lower extremity arterial disease. The left toe-brachial index is abnormal. He has not been seen by vascular surgery despite these findings. He presented to clinic today in [name] and is using [name] to offload. Wound VAC was in place. Once this was removed, [name] large ulcer was identified on the left midfoot/ankle. Bone is frankly exposed. There is no malodorous or purulent drainage. There is some granulation tissue over the central portion of the exposed bone. There is [name] tunnel that extends posteriorly for roughly 10 cm. It has been discussed with him by multiple providers that he is at very high risk of losing his lower leg because of this wound. He is extremely eager to avoid this outcome and is here today to review his options as well as receive ongoing wound care. 09/03/2021: Here for reevaluation of his wound. There does not appear to have been any substantial improvement overall since our last visit. He has been in [name] with white foam overlying the exposed bone. We are working on getting him approved for hyperbaric oxygen therapy. 09/10/2021: We are in the process of getting him cleared to begin hyperbaric oxygen therapy. He still needs to obtain [name] chest x-ray.  Although the wound measurements are roughly the same, I think the overall appearance of the wound is better. The exposed bone has [name] bit more granulation tissue covering it. He has not received [name] vascular surgery appointment to reevaluate his flow to the wound. 09/17/2021: He has been approved for hyperbaric oxygen therapy and completed his chest x-ray, which I reviewed and it appears normal. The tunnels at the 12 and 10:00 positions are smaller. There is more granulation tissue covering the exposed bone and the undermining  has decreased. He still has not received [name] vascular surgery appointment. 09/24/2021: He initiated hyperbaric oxygen therapy this week and is tolerating it well. He has an appointment with vascular surgery coming up on May 16. The granulation tissue is covering more of the exposed bone and both tunnels are [name] bit smaller. 10/01/2021: He continues to tolerate hyperbaric oxygen therapy. He saw infectious disease and they are planning to pull his PICC line. He has been initiated on oral antibiotics (doxycycline and Augmentin). The wound looks about the same but the tunnels are [name] little bit smaller. The skin seems to be contracting somewhat around the exposed bone. 10/08/2021: The wound is still about the same size, but the tunnels continue to come in and the skin is contracting around the exposed bone. He continues to have some accumulation of necrotic material in the inferoposterior aspect of the wound as well as accumulation at the 12:00 tunnel area. 10/15/2021: The wound is smaller today. The tunnels continue to come in. There is less necrotic tissue present. He does have some periwound maceration. 10/22/2021: The wound is about the same size. There is [name] little bit less undermining at the distal portion. The exposed bone is dark and I am not sure if this is staining from silver nitrate or his VAC sponge or if it represents necrosis. The tunnels are shallower but he does have some serous  drainage coming from the 10:00 tunnel. He continues to tolerate hyperbaric oxygen therapy well. 10/29/2021: The undermining continues to improve. The tunnels are about the same. He has good granulation tissue overlying the majority of the exposed bone. It does appear that perhaps the tubing from his wound VAC has been eroding the skin at the 12 clock position. He continues to accumulate senescent epithelium around the borders of the wound. 11/05/2021: The undermining is almost completely resolved. The tunnels have contracted fairly significantly. No significant slough or debris accumulation. There is still senescent epithelium accumulation around the borders of the wound. He has been tolerating hyperbaric oxygen therapy well. 11/12/2021: Despite the measurements of the wound being about the same, the wound has changed in its shape and overall, I think it is improved. The undermining has resolved and the tunnels continue to shorten. There is good granulation tissue encroaching over the small area of bone that has remained exposed at the 12 o'clock position. Minimal slough accumulation. He continues to tolerate hyperbaric oxygen therapy well. 11/19/2021: I took [name] PCR culture last week. There was overgrowth of yeast. He is already taking suppressive doxycycline and Augmentin. I added fluconazole to his regimen. The wound is smaller again today. The tunnels continue to shorten. He continues to do well with hyperbaric oxygen therapy. 11/26/2021: For some reason, his foot has become macerated. The wound is narrower but about the same dimensions in its longitudinal aspect. The tunnels continue to shorten. He has some slough buildup on the wound as well as some heaped up senescent epithelium around the perimeter. 12/03/2021: No further maceration of his foot has occurred. The wound has contracted quite significantly from last week. The tunnel at 10:00 is closed. The tunnel at 12:00 is down to just [name] couple of  millimeters. No other undermining is present. There is soft tissue coverage of the previously exposed bone. There is just [name] bit of slough and biofilm on the wound surface. 12/10/2021: The wound is looking good. It turns out the tunnel at 12:00 is only exposed when the patient dorsiflexes his foot. It is about 2  cm in depth when he does this; when his foot is in plantarflexion, the tunnel is closed. The bone that was visible at the 12:00 tunnel is completely covered with granulation tissue, but there does feel like some exposed bone deeper into the tunnel area. There is senescent skin heaped up around the periphery. Minimal slough on the wound surface. 12/16/2021: The wound dimensions are roughly the same. The surface has nice granulation tissue. The exposed bone at the 12:00 tunnel continues to be covered with more soft tissue. 7/14; patient's wound measures smaller today. Using the wound VAC with underlying collagen. He is also being treated with HBO for underlying osteomyelitis. He tells me he is on doxycycline and ampicillin follows with infectious disease next week 12/31/2021: The wound continues to contract. Unfortunately, the area where the track pad and tubing have been rubbing continues to look like it is applying friction. He says that the home health nurses that have been applying the [facility] have been putting gauze underneath the tubing, but nonetheless there is ongoing tissue breakdown at this site. Light slough accumulation on the wound surface. The tunnel continues to contract. He is tolerating HBO without difficulty. 01/07/2022: Bridging the wound VAC away from the ankle has resulted in significant improvement in the tissue at the apex of the wound. The tunnel is still present and is not all that much shorter, but the overall wound surface is very robust and healthy looking. Minimal slough accumulation. No concern for acute infection. 01/21/2022: The wound continues to contract and has [name] robust  granulation tissue surface. The tunnel has come in considerably and is down to about 1.4 cm. There is still bone exposed within the tunnel but the rest of it is well covered. There is some senescent epithelium at the wound margins and [name] little bit of slough [name], [name] ([id number]) [phone].pdf Page 4 of 12 on the surface. 01/28/2022: No significant change in the wound this week, but there has not been any reaccumulation of senescent epithelium or slough. The tunnel is perhaps [name] millimeter less in depth. He has been approved for Apligraf and we will apply this today. 02/04/2022: The wound has contracted somewhat and the tunnel has filled in completely. The wound surface is clean. He is here for Apligraf #2. 02/11/2022: The wound has contracted further and is now nearly flush with the surrounding skin surface. Light layer of slough. Apligraf #3 plan for today. 02/18/2022: There is [name] band of epithelium trying to cut across the superior portion of the wound. There is robust granulation tissue with just [name] light layer of slough and biofilm on the surface. There was [name] little bit of greenish drainage in the wound VAC but none appreciated on the site itself. He is here for Apligraf #4. 03/04/2022: The wound has contracted considerably. There is good granulation tissue on the surface. Minimal biofilm. He is here for Apligraf #5. 03/18/2022: The wound has epithelialized to the point that it has been divided into 2 areas. Both areas are smaller in total than at his previous visit. There is good granulation tissue present with just [name] little bit of periwound eschar and surface slough. 10/13; left medial foot. The patient has completed treatment with hyperbaric oxygen for underlying osteomyelitis he has had [name] nice response in the wound. Noted today that he still had some greenish drainage. Previously he has been treated with topical gentamicin but apparently that was stopped 2 [name]  ago. Fortunately the wound is measuring smaller 10/20; wound looks  better no hypergranulation minimal drainage. Granulation tissue looks healthy using gentamicin Hydrofera Blue under compression 04/08/2022: The wound continues to contract tremendously. The response to his treatment for hypertrophic granulation tissue has been quite remarkable. There are just [name] couple of open areas remaining. 04/18/2022: His wound is healed. READMISSION 07/04/2022: He returns to clinic with [name] new ulcer adjacent to where his previous large wound had been. He says that he was wearing boots and using [name] small padded dressing to protect the freshly healed ankle wound. Apparently the dressing rolled up and rubbed on his skin, causing [name] new wound. He contacted our office last week concerned about the appearance. There is [name] small oval wound on his left medial ankle. It is fairly clean with some periwound dry skin and eschar. 07/11/2022: The wound is about the same size this week. There is [name] layer of rubbery slough on the surface. It has [name] fairly strong odor. 07/18/2022: The wound measured larger this week due to small satellite areas opening in [name] fan pattern distal to the primary wound. He continues to accumulate slough on the wound surface. The culture that I took last week only grew out low levels of skin flora. 07/25/2022: The satellite areas that had opened last week have closed. The main wound is smaller with some slough accumulation. 08/01/2022: The wound continues to contract. There is [name] layer of slough on the surface. 08/08/2022: The wound is [name] little bit smaller again this week. Still with slough buildup. 3/18; patient presents for follow-up. He has been doing dressing changes on his own with silver alginate and antibiotic ointment. He has been at [employer] over the past 3 [name]. He has developed [name] new wound. 09/05/2022: I am not sure if the new wound that was reported last week is just the satellite area that had  closed [name] couple of [name] ago and reopened. Regardless, both wounds are quite small with good epithelialization. There is some eschar buildup around the periphery of both wounds with slough on both wound surfaces. Electronic Signature(s) Signed: 09/05/2022 4:52:27 PM By: Colin Maudlin MD FACS Entered By: [name] on 09/05/2022 16:52:27 -------------------------------------------------------------------------------- Physical Exam Details Patient Name: Date of Service: NA [name]. 09/05/2022 4:15 PM Medical Record Number: [id number] Patient Account Number: [id number] Date of Birth/Sex: Treating RN: June 21, 1955 (67 y.o. M) Primary Care Provider: Cher [name] Other Clinician: Referring Provider: Treating Provider/Extender: [name] in Treatment: 9 Constitutional Hypertensive, asymptomatic. . . . no acute distress. Respiratory Normal work of breathing on room air. Notes 09/05/2022: I am not sure if the new wound that was reported last week is just the satellite area that had closed [name] couple of [name] ago and reopened. Regardless, both wounds are quite small with good epithelialization. There is some eschar buildup around the periphery of both wounds with slough on both wound surfaces. Electronic Signature(s) Signed: 09/05/2022 4:53:05 PM By: Colin Maudlin MD FACS Entered By: [name] on 09/05/2022 16:53:05 [name] ([id number] Page 5 of 12 -------------------------------------------------------------------------------- Physician Orders Details Patient Name: Date of Service: [name] 09/05/2022 4:15 PM Medical Record Number: [id number] Patient Account Number: [id number] Date of Birth/Sex: Treating RN: Jul 14, 1955 (67 y.o. [name] Primary Care Provider: Cher [name] Other Clinician: Referring Provider: Treating Provider/Extender: [name] in  Treatment: 9 Verbal / Phone Orders: No Diagnosis Coding ICD-10 Coding Code Description [phone] Non-pressure chronic ulcer of left ankle with fat layer exposed  I73.9 Peripheral vascular disease, unspecified E11.622 Type 2 diabetes mellitus with other skin ulcer Follow-up Appointments ppointment in 1 week. - Dr. Celine [name] - room 2 Return [name] Monday 4/1 @ 15:30 pm Bathing/ Shower/ Hygiene May shower and wash wound with soap and water. Edema Control - Lymphedema / SCD / Other Elevate legs to the level of the heart or above for 30 minutes daily and/or when sitting for 3-4 times [name] day throughout the day. Avoid standing for long periods of time. Wound Treatment Wound #2 - Ankle Wound Laterality: Left, Medial Cleanser: Normal Saline 1 x Per Day/30 Days Discharge Instructions: Cleanse the wound with Normal Saline prior to applying [name] clean dressing using gauze sponges, not tissue or cotton balls. Peri-Wound Care: Sween Lotion (Moisturizing lotion) 1 x Per Day/30 Days Discharge Instructions: Apply moisturizing lotion as directed Topical: Mupirocin Ointment 1 x Per Day/30 Days Discharge Instructions: Apply Mupirocin (Bactroban) as instructed Prim Dressing: [place] 2x2 (in/in) (Dispense As Written) 1 x Per Day/30 Days ary Discharge Instructions: Apply to wound bed as instructed Secondary Dressing: ABD Pad, 5x9 (Generic) 1 x Per Day/30 Days Discharge Instructions: Apply over primary dressing as directed. Secondary Dressing: Optifoam Non-Adhesive Dressing, 4x4 in 1 x Per Day/30 Days Discharge Instructions: Apply over primary dressing as directed. Secured With: Elastic Bandage 4 inch (ACE bandage) 1 x Per Day/30 Days Discharge Instructions: Secure with ACE bandage as directed. Secured With: [employer], 4.5x3.1 (in/yd) 1 x Per Day/30 Days Discharge Instructions: Secure with Kerlix as directed. Secured With: Transpore Surgical Tape, 2x10 (in/yd) 1 x Per Day/30 Days Discharge  Instructions: Secure dressing with tape as directed. Wound #3 - Ankle Wound Laterality: Left, Distal Cleanser: Normal Saline 1 x Per Day/30 Days Discharge Instructions: Cleanse the wound with Normal Saline prior to applying [name] clean dressing using gauze sponges, not tissue or cotton balls. Peri-Wound Care: Sween Lotion (Moisturizing lotion) 1 x Per Day/30 Days Discharge Instructions: Apply moisturizing lotion as directed Topical: Mupirocin Ointment 1 x Per Day/30 Days Discharge Instructions: Apply Mupirocin (Bactroban) as instructed Prim Dressing: Sorbalgon [employer] Dressing 2x2 (in/in) (Dispense As Written) 1 x Per Day/30 Days ary Discharge Instructions: Apply to wound bed as instructed Secondary Dressing: ABD Pad, 5x9 (Generic) 1 x Per Day/30 Days [name], [name] ([id number]) [phone].pdf Page 6 of 12 Discharge Instructions: Apply over primary dressing as directed. Secondary Dressing: Optifoam Non-Adhesive Dressing, 4x4 in 1 x Per Day/30 Days Discharge Instructions: Apply over primary dressing as directed. Secured With: Elastic Bandage 4 inch (ACE bandage) 1 x Per Day/30 Days Discharge Instructions: Secure with ACE bandage as directed. Secured With: [employer], 4.5x3.1 (in/yd) 1 x Per Day/30 Days Discharge Instructions: Secure with Kerlix as directed. Secured With: Transpore Surgical Tape, 2x10 (in/yd) 1 x Per Day/30 Days Discharge Instructions: Secure dressing with tape as directed. Electronic Signature(s) Signed: 09/05/2022 5:12:23 PM By: Colin Maudlin MD FACS Entered By: [name] on 09/05/2022 16:56:01 -------------------------------------------------------------------------------- Problem List Details Patient Name: Date of Service: NA [name]. 09/05/2022 4:15 PM Medical Record Number: [id number] Patient Account Number: [id number] Date of Birth/Sex: Treating RN: 1955-07-29 (67 y.o. [name] Primary Care Provider: Cher [name]  Other Clinician: Referring Provider: Treating Provider/Extender: [name] in Treatment: 9 Active Problems ICD-10 Encounter Code Description Active Date MDM Diagnosis [phone] Non-pressure chronic ulcer of left ankle with fat layer exposed 07/04/2022 No Yes I73.9 Peripheral vascular disease, unspecified 07/04/2022 No Yes E11.622 Type 2 diabetes mellitus with other skin  ulcer 07/04/2022 No Yes Inactive Problems Resolved Problems Electronic Signature(s) Signed: 09/05/2022 4:51:01 PM By: Colin Maudlin MD FACS Previous Signature: 09/05/2022 4:40:03 PM Version By: Deon Pilling RN, BSN Entered By: [name] on 09/05/2022 16:51:01 -------------------------------------------------------------------------------- Progress Note Details Patient Name: Date of Service: NA [name]. 09/05/2022 4:15 PM Medical Record Number: [id number] Patient Account Number: [id number] Date of Birth/Sex: Treating RN: 1956-01-08 (67 y.o. M) Primary Care Provider: Cher [name] Other Clinician: Referring Provider: Treating Provider/Extender: [name] in Treatment: [address] [name], [name] ([id number]) [id number] Page 7 of 12 Chief Complaint Information obtained from Patient Patients presents for treatment of an open diabetic ulcer with exposed bone and osteomyelitis 07/04/2022: Diabetic ankle ulcer History of Present Illness (HPI) ADMISSION 08/25/2021 This is [name] 67 year old man who initially presented to his primary care provider in September 2022 with pain in his left foot. He was sent for an x-ray and while the x-ray was being performed, the tech pointed out [name] wound on his foot that the patient was not aware existed. He does have type 2 diabetes with significant neuropathy. His diabetes is suboptimally controlled with his most recent A1c being 8.5. He also has [name] history of coronary artery disease status post three- vessel  CABG. he was initially seen by orthopedics, but they referred him to [facility]. He has undergone at least 7 operations/debridements and several applications of skin substitute under the care of podiatry. He has been in [name] for much of this time. His most recent procedure was July 28, 2021. [name] was biopsied and was found to be consistent with osteomyelitis. Culture also returned positive for corynebacterium. He was seen on August 16, 2021 by infectious disease. [name] PICC line has been placed and he will be receiving [name] 6-week course of IV daptomycin and cefepime. In October 2022, he underwent lower extremity vascular studies. Results are copied here: Right: Resting right ankle-brachial index is within normal range. No evidence of significant right lower extremity arterial disease. The right toe-brachial index is abnormal. Left: Resting left ankle-brachial index indicates mild left lower extremity arterial disease. The left toe-brachial index is abnormal. He has not been seen by vascular surgery despite these findings. He presented to clinic today in [name] and is using [name] to offload. Wound VAC was in place. Once this was removed, [name] large ulcer was identified on the left midfoot/ankle. Bone is frankly exposed. There is no malodorous or purulent drainage. There is some granulation tissue over the central portion of the exposed bone. There is [name] tunnel that extends posteriorly for roughly 10 cm. It has been discussed with him by multiple providers that he is at very high risk of losing his lower leg because of this wound. He is extremely eager to avoid this outcome and is here today to review his options as well as receive ongoing wound care. 09/03/2021: Here for reevaluation of his wound. There does not appear to have been any substantial improvement overall since our last visit. He has been in [name] with white foam overlying the exposed  bone. We are working on getting him approved for hyperbaric oxygen therapy. 09/10/2021: We are in the process of getting him cleared to begin hyperbaric oxygen therapy. He still needs to obtain [name] chest x-ray. Although the wound measurements are roughly the same, I think the overall appearance of the wound is better. The exposed bone has  [name] bit more granulation tissue covering it. He has not received [name] vascular surgery appointment to reevaluate his flow to the wound. 09/17/2021: He has been approved for hyperbaric oxygen therapy and completed his chest x-ray, which I reviewed and it appears normal. The tunnels at the 12 and 10:00 positions are smaller. There is more granulation tissue covering the exposed bone and the undermining has decreased. He still has not received [name] vascular surgery appointment. 09/24/2021: He initiated hyperbaric oxygen therapy this week and is tolerating it well. He has an appointment with vascular surgery coming up on May 16. The granulation tissue is covering more of the exposed bone and both tunnels are [name] bit smaller. 10/01/2021: He continues to tolerate hyperbaric oxygen therapy. He saw infectious disease and they are planning to pull his PICC line. He has been initiated on oral antibiotics (doxycycline and Augmentin). The wound looks about the same but the tunnels are [name] little bit smaller. The skin seems to be contracting somewhat around the exposed bone. 10/08/2021: The wound is still about the same size, but the tunnels continue to come in and the skin is contracting around the exposed bone. He continues to have some accumulation of necrotic material in the inferoposterior aspect of the wound as well as accumulation at the 12:00 tunnel area. 10/15/2021: The wound is smaller today. The tunnels continue to come in. There is less necrotic tissue present. He does have some periwound maceration. 10/22/2021: The wound is about the same size. There is [name] little bit less undermining at the  distal portion. The exposed bone is dark and I am not sure if this is staining from silver nitrate or his VAC sponge or if it represents necrosis. The tunnels are shallower but he does have some serous drainage coming from the 10:00 tunnel. He continues to tolerate hyperbaric oxygen therapy well. 10/29/2021: The undermining continues to improve. The tunnels are about the same. He has good granulation tissue overlying the majority of the exposed bone. It does appear that perhaps the tubing from his wound VAC has been eroding the skin at the 12 clock position. He continues to accumulate senescent epithelium around the borders of the wound. 11/05/2021: The undermining is almost completely resolved. The tunnels have contracted fairly significantly. No significant slough or debris accumulation. There is still senescent epithelium accumulation around the borders of the wound. He has been tolerating hyperbaric oxygen therapy well. 11/12/2021: Despite the measurements of the wound being about the same, the wound has changed in its shape and overall, I think it is improved. The undermining has resolved and the tunnels continue to shorten. There is good granulation tissue encroaching over the small area of bone that has remained exposed at the 12 o'clock position. Minimal slough accumulation. He continues to tolerate hyperbaric oxygen therapy well. 11/19/2021: I took [name] PCR culture last week. There was overgrowth of yeast. He is already taking suppressive doxycycline and Augmentin. I added fluconazole to his regimen. The wound is smaller again today. The tunnels continue to shorten. He continues to do well with hyperbaric oxygen therapy. 11/26/2021: For some reason, his foot has become macerated. The wound is narrower but about the same dimensions in its longitudinal aspect. The tunnels continue to shorten. He has some slough buildup on the wound as well as some heaped up senescent epithelium around the  perimeter. 12/03/2021: No further maceration of his foot has occurred. The wound has contracted quite significantly from last week. The tunnel at 10:00 is closed. The  tunnel at 12:00 is down to just [name] couple of millimeters. No other undermining is present. There is soft tissue coverage of the previously exposed bone. There is just [name] bit of slough and biofilm on the wound surface. 12/10/2021: The wound is looking good. It turns out the tunnel at 12:00 is only exposed when the patient dorsiflexes his foot. It is about 2 cm in depth when he does this; when his foot is in plantarflexion, the tunnel is closed. The bone that was visible at the 12:00 tunnel is completely covered with granulation tissue, but there does feel like some exposed bone deeper into the tunnel area. There is senescent skin heaped up around the periphery. Minimal slough on the wound surface. 12/16/2021: The wound dimensions are roughly the same. The surface has nice granulation tissue. The exposed bone at the 12:00 tunnel continues to be covered with more soft tissue. 7/14; patient's wound measures smaller today. Using the wound VAC with underlying collagen. He is also being treated with HBO for underlying osteomyelitis. He tells me he is on doxycycline and ampicillin follows with infectious disease next week [name], [name] ([id number]) [phone].pdf Page 8 of 12 12/31/2021: The wound continues to contract. Unfortunately, the area where the track pad and tubing have been rubbing continues to look like it is applying friction. He says that the home health nurses that have been applying the [facility] have been putting gauze underneath the tubing, but nonetheless there is ongoing tissue breakdown at this site. Light slough accumulation on the wound surface. The tunnel continues to contract. He is tolerating HBO without difficulty. 01/07/2022: Bridging the wound VAC away from the ankle has resulted in significant  improvement in the tissue at the apex of the wound. The tunnel is still present and is not all that much shorter, but the overall wound surface is very robust and healthy looking. Minimal slough accumulation. No concern for acute infection. 01/21/2022: The wound continues to contract and has [name] robust granulation tissue surface. The tunnel has come in considerably and is down to about 1.4 cm. There is still bone exposed within the tunnel but the rest of it is well covered. There is some senescent epithelium at the wound margins and [name] little bit of slough on the surface. 01/28/2022: No significant change in the wound this week, but there has not been any reaccumulation of senescent epithelium or slough. The tunnel is perhaps [name] millimeter less in depth. He has been approved for Apligraf and we will apply this today. 02/04/2022: The wound has contracted somewhat and the tunnel has filled in completely. The wound surface is clean. He is here for Apligraf #2. 02/11/2022: The wound has contracted further and is now nearly flush with the surrounding skin surface. Light layer of slough. Apligraf #3 plan for today. 02/18/2022: There is [name] band of epithelium trying to cut across the superior portion of the wound. There is robust granulation tissue with just [name] light layer of slough and biofilm on the surface. There was [name] little bit of greenish drainage in the wound VAC but none appreciated on the site itself. He is here for Apligraf #4. 03/04/2022: The wound has contracted considerably. There is good granulation tissue on the surface. Minimal biofilm. He is here for Apligraf #5. 03/18/2022: The wound has epithelialized to the point that it has been divided into 2 areas. Both areas are smaller in total than at his previous visit. There is good granulation tissue present with just [name] little bit  of periwound eschar and surface slough. 10/13; left medial foot. The patient has completed treatment with hyperbaric oxygen for  underlying osteomyelitis he has had [name] nice response in the wound. Noted today that he still had some greenish drainage. Previously he has been treated with topical gentamicin but apparently that was stopped 2 [name] ago. Fortunately the wound is measuring smaller 10/20; wound looks better no hypergranulation minimal drainage. Granulation tissue looks healthy using gentamicin Hydrofera Blue under compression 04/08/2022: The wound continues to contract tremendously. The response to his treatment for hypertrophic granulation tissue has been quite remarkable. There are just [name] couple of open areas remaining. 04/18/2022: His wound is healed. READMISSION 07/04/2022: He returns to clinic with [name] new ulcer adjacent to where his previous large wound had been. He says that he was wearing boots and using [name] small padded dressing to protect the freshly healed ankle wound. Apparently the dressing rolled up and rubbed on his skin, causing [name] new wound. He contacted our office last week concerned about the appearance. There is [name] small oval wound on his left medial ankle. It is fairly clean with some periwound dry skin and eschar. 07/11/2022: The wound is about the same size this week. There is [name] layer of rubbery slough on the surface. It has [name] fairly strong odor. 07/18/2022: The wound measured larger this week due to small satellite areas opening in [name] fan pattern distal to the primary wound. He continues to accumulate slough on the wound surface. The culture that I took last week only grew out low levels of skin flora. 07/25/2022: The satellite areas that had opened last week have closed. The main wound is smaller with some slough accumulation. 08/01/2022: The wound continues to contract. There is [name] layer of slough on the surface. 08/08/2022: The wound is [name] little bit smaller again this week. Still with slough buildup. 3/18; patient presents for follow-up. He has been doing dressing changes on his own with silver alginate and  antibiotic ointment. He has been at [employer] over the past 3 [name]. He has developed [name] new wound. 09/05/2022: I am not sure if the new wound that was reported last week is just the satellite area that had closed [name] couple of [name] ago and reopened. Regardless, both wounds are quite small with good epithelialization. There is some eschar buildup around the periphery of both wounds with slough on both wound surfaces. Patient History Information obtained from Patient. Family History Cancer - Father, Diabetes - Father,Mother,Paternal Grandparents, Heart Disease - Father, Hypertension - Father, No family history of Hereditary Spherocytosis, Kidney Disease, Lung Disease, Seizures, Stroke, Thyroid Problems, Tuberculosis. Social History Never smoker, Marital Status - Married, Alcohol Use - Rarely, Drug Use - No History, Caffeine Use - Daily. Medical History Eyes Patient has history of Cataracts - Removed 2008 Cardiovascular Patient has history of Coronary Artery Disease, Hypertension, Myocardial Infarction, Peripheral Arterial Disease Endocrine Patient has history of Type II Diabetes Musculoskeletal Patient has history of Osteomyelitis Neurologic Patient has history of Neuropathy Medical [name] Surgical History Notes nd Cardiovascular Hypercholesterolemia Abnormal EKG CABG X3 2019 Gastrointestinal GERD Musculoskeletal Diabetic foot ulcer Pinson, [name] ([id number].pdf Page 9 of 12 Objective Constitutional Hypertensive, asymptomatic. no acute distress. Vitals Time Taken: 4:15 PM, Height: 74 in, Weight: 187 lbs, BMI: 24, Temperature: 97.6 F, Pulse: 77 bpm, Respiratory Rate: 20 breaths/min, Blood Pressure: 164/97 mmHg. Respiratory Normal work of breathing on room air. General Notes: 09/05/2022: I am not sure if the new wound that was  reported last week is just the satellite area that had closed [name] couple of [name] ago and reopened. Regardless, both  wounds are quite small with good epithelialization. There is some eschar buildup around the periphery of both wounds with slough on both wound surfaces. Integumentary (Hair, Skin) Wound #2 status is Open. Original cause of wound was Pressure Injury. The date acquired was: 06/13/2022. The wound has been in treatment 9 [name]. The wound is located on the Left,Medial Ankle. The wound measures 0.5cm length x 0.4cm width x 0.1cm depth; 0.157cm^2 area and 0.016cm^3 volume. There is Fat Layer (Subcutaneous Tissue) exposed. There is no tunneling or undermining noted. There is [name] medium amount of serous drainage noted. The wound margin is distinct with the outline attached to the wound base. There is large (67-100%) pink, pale granulation within the wound bed. There is no necrotic tissue within the wound bed. The periwound skin appearance had no abnormalities noted for color. The periwound skin appearance exhibited: Callus, Dry/Scaly. The periwound skin appearance did not exhibit: Maceration. Periwound temperature was noted as No Abnormality. Wound #3 status is Open. Original cause of wound was Gradually Appeared. The date acquired was: 08/29/2022. The wound has been in treatment 1 [name]. The wound is located on the Left,Distal Ankle. The wound measures 2.8cm length x 1cm width x 0.3cm depth; 2.199cm^2 area and 0.66cm^3 volume. There is Fat Layer (Subcutaneous Tissue) exposed. There is no tunneling or undermining noted. There is [name] medium amount of serosanguineous drainage noted. The wound margin is distinct with the outline attached to the wound base. There is large (67-100%) red, pink granulation within the wound bed. There is no necrotic tissue within the wound bed. The periwound skin appearance had no abnormalities noted for color. The periwound skin appearance exhibited: Callus. The periwound skin appearance did not exhibit: Maceration. Periwound temperature was noted as No Abnormality. Assessment Active  Problems ICD-10 Non-pressure chronic ulcer of left ankle with fat layer exposed Peripheral vascular disease, unspecified Type 2 diabetes mellitus with other skin ulcer Procedures Wound #2 Pre-procedure diagnosis of Wound #2 is [name] Diabetic Wound/Ulcer of the Lower Extremity located on the Left,Medial Ankle .Severity of Tissue Pre Debridement is: Fat layer exposed. There was [name] Selective/Open Wound Non-Viable Tissue Debridement with [name] total area of 0.25 sq cm performed by Colin Maudlin, MD. With the following instrument(s): Curette to remove Non-Viable tissue/material. Material removed includes Eschar and Slough and after achieving pain control using Lidocaine 4% T opical Solution. No specimens were taken. [name] time out was conducted at 16:30, prior to the start of the procedure. [name] Minimum amount of bleeding was controlled with Pressure. The procedure was tolerated well with [name] pain level of 0 throughout and [name] pain level of 0 following the procedure. Post Debridement Measurements: 0.5cm length x 0.4cm width x 0.1cm depth; 0.016cm^3 volume. Character of Wound/Ulcer Post Debridement is improved. Severity of Tissue Post Debridement is: Fat layer exposed. Post procedure Diagnosis Wound #2: Same as Pre-Procedure General Notes: scribed for Dr. Celine [name] by Colin Gouty, RN. Wound #3 Pre-procedure diagnosis of Wound #3 is [name] Diabetic Wound/Ulcer of the Lower Extremity located on the Left,Distal Ankle .Severity of Tissue Pre Debridement is: Fat layer exposed. There was [name] Selective/Open Wound Skin/Epidermis Debridement with [name] total area of 2.8 sq cm performed by Colin Maudlin, MD. With the following instrument(s): Curette to remove Non-Viable tissue/material. Material removed includes Eschar, Slough, and Skin: Epidermis after achieving pain control using Lidocaine 4% T opical Solution. No specimens were taken.  [name] time out was conducted at 16:30, prior to the start of the procedure. [name] Minimum amount of  bleeding was controlled with Pressure. The procedure was tolerated well with [name] pain level of 0 throughout and [name] pain level of 0 following the procedure. Post Debridement Measurements: 2.8cm length x 1cm width x 0.1cm depth; 0.22cm^3 volume. Character of Wound/Ulcer Post Debridement is improved. Severity of Tissue Post Debridement is: Fat layer exposed. Post procedure Diagnosis Wound #3: Same as Pre-Procedure General Notes: scribed for Dr. Celine [name] by Colin Gouty, RN. [name], [name] ([id number]) [id number] Page 10 of 12 Plan Follow-up Appointments: Return Appointment in 1 week. - Dr. Celine [name] - room 2 Monday 4/1 @ 15:30 pm Bathing/ Shower/ Hygiene: May shower and wash wound with soap and water. Edema Control - Lymphedema / SCD / Other: Elevate legs to the level of the heart or above for 30 minutes daily and/or when sitting for 3-4 times [name] day throughout the day. Avoid standing for long periods of time. WOUND #2: - Ankle Wound Laterality: Left, Medial Cleanser: Normal Saline 1 x Per Day/30 Days Discharge Instructions: Cleanse the wound with Normal Saline prior to applying [name] clean dressing using gauze sponges, not tissue or cotton balls. Peri-Wound Care: Sween Lotion (Moisturizing lotion) 1 x Per Day/30 Days Discharge Instructions: Apply moisturizing lotion as directed Topical: Mupirocin Ointment 1 x Per Day/30 Days Discharge Instructions: Apply Mupirocin (Bactroban) as instructed Prim Dressing: [place] 2x2 (in/in) (Dispense As Written) 1 x Per Day/30 Days ary Discharge Instructions: Apply to wound bed as instructed Secondary Dressing: ABD Pad, 5x9 (Generic) 1 x Per Day/30 Days Discharge Instructions: Apply over primary dressing as directed. Secondary Dressing: Optifoam Non-Adhesive Dressing, 4x4 in 1 x Per Day/30 Days Discharge Instructions: Apply over primary dressing as directed. Secured With: Elastic Bandage 4 inch (ACE bandage) 1 x Per Day/30  Days Discharge Instructions: Secure with ACE bandage as directed. Secured With: [employer], 4.5x3.1 (in/yd) 1 x Per Day/30 Days Discharge Instructions: Secure with Kerlix as directed. Secured With: Transpore Surgical T ape, 2x10 (in/yd) 1 x Per Day/30 Days Discharge Instructions: Secure dressing with tape as directed. WOUND #3: - Ankle Wound Laterality: Left, Distal Cleanser: Normal Saline 1 x Per Day/30 Days Discharge Instructions: Cleanse the wound with Normal Saline prior to applying [name] clean dressing using gauze sponges, not tissue or cotton balls. Peri-Wound Care: Sween Lotion (Moisturizing lotion) 1 x Per Day/30 Days Discharge Instructions: Apply moisturizing lotion as directed Topical: Mupirocin Ointment 1 x Per Day/30 Days Discharge Instructions: Apply Mupirocin (Bactroban) as instructed Prim Dressing: [place] 2x2 (in/in) (Dispense As Written) 1 x Per Day/30 Days ary Discharge Instructions: Apply to wound bed as instructed Secondary Dressing: ABD Pad, 5x9 (Generic) 1 x Per Day/30 Days Discharge Instructions: Apply over primary dressing as directed. Secondary Dressing: Optifoam Non-Adhesive Dressing, 4x4 in 1 x Per Day/30 Days Discharge Instructions: Apply over primary dressing as directed. Secured With: Elastic Bandage 4 inch (ACE bandage) 1 x Per Day/30 Days Discharge Instructions: Secure with ACE bandage as directed. Secured With: [employer], 4.5x3.1 (in/yd) 1 x Per Day/30 Days Discharge Instructions: Secure with Kerlix as directed. Secured With: Transpore Surgical T ape, 2x10 (in/yd) 1 x Per Day/30 Days Discharge Instructions: Secure dressing with tape as directed. 09/05/2022: I am not sure if the new wound that was reported last week is just the satellite area that had closed [name] couple of [name] ago and reopened. Regardless, both wounds are quite small with good  epithelialization. There is some eschar buildup around the periphery of both wounds with  slough on both wound surfaces. I used [name] curette to debride eschar and slough from both wounds. We will continue topical mupirocin with silver alginate to both sites. Continue using [name] foam donut to protect the area along with Kerlix wrapping. Follow-up in 1 week. Electronic Signature(s) Signed: 09/05/2022 4:57:43 PM By: Colin Maudlin MD FACS Entered By: [name] on 09/05/2022 16:57:43 -------------------------------------------------------------------------------- HxROS Details Patient Name: Date of Service: NA Colin [name] 09/05/2022 4:15 PM Medical Record Number: [id number] Patient Account Number: [id number] Date of Birth/Sex: Treating RN: 1956-04-18 (67 y.o. M) Primary Care Provider: Cher [name] Other Clinician: Referring Provider: Treating Provider/Extender: [name] in Treatment: 9 Information Obtained From Patient Eyes Medical History: Positive for: Cataracts - Removed 2008 [name], [name] ([id number]) [id number] Page 11 of 12 Cardiovascular Medical History: Positive for: Coronary Artery Disease; Hypertension; Myocardial Infarction; Peripheral Arterial Disease Past Medical History Notes: Hypercholesterolemia Abnormal EKG CABG X3 2019 Gastrointestinal Medical History: Past Medical History Notes: GERD Endocrine Medical History: Positive for: Type II Diabetes Time with diabetes: 24 years Treated with: Insulin, Oral agents Blood sugar tested every day: Yes Tested : Musculoskeletal Medical History: Positive for: Osteomyelitis Past Medical History Notes: Diabetic foot ulcer Neurologic Medical History: Positive for: Neuropathy HBO Extended History Items Eyes: Cataracts Immunizations Pneumococcal Vaccine: Received Pneumococcal Vaccination: Yes Received Pneumococcal Vaccination On or After 60th Birthday: Yes Implantable Devices Yes Family and Social History Cancer: Yes - Father; Diabetes: Yes -  Father,Mother,Paternal Grandparents; Heart Disease: Yes - Father; Hereditary Spherocytosis: No; Hypertension: Yes - Father; Kidney Disease: No; Lung Disease: No; Seizures: No; Stroke: No; Thyroid Problems: No; Tuberculosis: No; Never smoker; Marital Status - Married; Alcohol Use: Rarely; Drug Use: No History; Caffeine Use: Daily; Financial Concerns: No; Food, Clothing or Shelter Needs: No; Support System Lacking: No; Transportation Concerns: No Electronic Signature(s) Signed: 09/05/2022 5:12:23 PM By: Colin Maudlin MD FACS Entered By: [name] on 09/05/2022 16:52:33 -------------------------------------------------------------------------------- SuperBill Details Patient Name: Date of Service: NA [name]. 09/05/2022 Medical Record Number: [id number] Patient Account Number: [id number] Date of Birth/Sex: Treating RN: 1956/02/15 (67 y.o. M) Primary Care Provider: Cher [name] Other Clinician: Referring Provider: Treating Provider/Extender: [name] in Treatment: 9 Diagnosis Coding ICD-10 Codes Code Description [phone] Non-pressure chronic ulcer of left ankle with fat layer exposed [name], [name] ([id number]) [phone].pdf Page 12 of 12 I73.9 Peripheral vascular disease, unspecified E11.622 Type 2 diabetes mellitus with other skin ulcer Facility Procedures : CPT4 Code: [id number] Description: [id number] - DEBRIDE WOUND 1ST 20 SQ CM OR < ICD-10 Diagnosis Description [id number] Non-pressure chronic ulcer of left ankle with fat layer exposed Modifier: Quantity: 1 Physician Procedures : CPT4 Code Description Modifier [id number] - WC PHYS LEVEL 4 - EST PT 25 ICD-10 Diagnosis Description [id number] Non-pressure chronic ulcer of left ankle with fat layer exposed E11.622 Type 2 diabetes mellitus with other skin ulcer I73.9 Peripheral  vascular disease, unspecified Quantity: 1 : [id number] - WC PHYS DEBR WO ANESTH 20 SQ CM ICD-10  Diagnosis Description [id number] Non-pressure chronic ulcer of left ankle with fat layer exposed Quantity: 1 Electronic Signature(s) Signed: 09/05/2022 4:57:58 PM By: Colin Maudlin MD FACS Entered By: [name] on 09/05/2022 16:57:58

## 2022-09-12 ENCOUNTER — Encounter (HOSPITAL_BASED_OUTPATIENT_CLINIC_OR_DEPARTMENT_OTHER): Payer: BC Managed Care – PPO | Attending: General Surgery | Admitting: General Surgery

## 2022-09-12 DIAGNOSIS — I252 Old myocardial infarction: Secondary | ICD-10-CM | POA: Diagnosis not present

## 2022-09-12 DIAGNOSIS — I10 Essential (primary) hypertension: Secondary | ICD-10-CM | POA: Diagnosis not present

## 2022-09-12 DIAGNOSIS — E1140 Type 2 diabetes mellitus with diabetic neuropathy, unspecified: Secondary | ICD-10-CM | POA: Diagnosis not present

## 2022-09-12 DIAGNOSIS — Z951 Presence of aortocoronary bypass graft: Secondary | ICD-10-CM | POA: Insufficient documentation

## 2022-09-12 DIAGNOSIS — E11622 Type 2 diabetes mellitus with other skin ulcer: Secondary | ICD-10-CM | POA: Diagnosis not present

## 2022-09-12 DIAGNOSIS — I2510 Atherosclerotic heart disease of native coronary artery without angina pectoris: Secondary | ICD-10-CM | POA: Diagnosis not present

## 2022-09-12 DIAGNOSIS — E1151 Type 2 diabetes mellitus with diabetic peripheral angiopathy without gangrene: Secondary | ICD-10-CM | POA: Diagnosis not present

## 2022-09-12 DIAGNOSIS — L97322 Non-pressure chronic ulcer of left ankle with fat layer exposed: Secondary | ICD-10-CM | POA: Diagnosis not present

## 2022-09-13 NOTE — Progress Notes (Addendum)
JAYON, LEFEBURE [name] ([id number]) [id number] Page 1 of 8 Visit Report for 09/12/2022 Arrival Information Details Patient Name: Date of Service: NA [name] 09/12/2022 3:30 PM Medical Record Number: [id number] Patient Account Number: [id number] Date of Birth/Sex: Treating RN: 08-09-1955 (67 y.o. [name] Primary Care Damiah Mcdonald: [name] Other Clinician: Referring Jalayia Bagheri: Treating Jennavie Martinek/Extender: [name] in Treatment: 10 Visit Information History Since Last Visit Added or deleted any medications: No Patient Arrived: Knee Scooter Any new allergies or adverse reactions: No Arrival Time: 15:53 Had [name] fall or experienced change in No Accompanied By: self activities of daily living that may affect Transfer Assistance: None risk of falls: Patient Identification Verified: Yes Signs or symptoms of abuse/neglect since last visito No Secondary Verification Process Completed: Yes Hospitalized since last visit: No Patient Requires Transmission-Based Precautions: No Implantable device outside of the clinic excluding No Patient Has Alerts: No cellular tissue based products placed in the center since last visit: Has Dressing in Place as Prescribed: Yes Pain Present Now: No Electronic Signature(s) Signed: 09/12/2022 4:49:56 PM By: Adline Peals Entered By: Adline Peals on 09/12/2022 15:54:15 -------------------------------------------------------------------------------- Encounter Discharge Information Details Patient Name: Date of Service: NA Colin [name] 09/12/2022 3:30 PM Medical Record Number: [id number] Patient Account Number: [id number] Date of Birth/Sex: Treating RN: [birth year] (67 y.o. [name] Primary Care Bekah Igoe: [name] Other Clinician: Referring Reynalda Canny: Treating Fleurette Woolbright/Extender: [name] Weeks in Treatment: 10 Encounter Discharge Information Items Post  Procedure Vitals Discharge Condition: Stable Temperature (F): 97.6 Ambulatory Status: Knee Scooter Pulse (bpm): 80 Discharge Destination: Home Respiratory Rate (breaths/min): 18 Transportation: Private Auto Blood Pressure (mmHg): 180/94 Accompanied By: self Schedule Follow-up Appointment: Yes Clinical Summary of Care: Patient Declined Electronic Signature(s) Signed: 09/12/2022 4:49:56 PM By: Adline Peals Entered By: Adline Peals on 09/12/2022 16:24:57 -------------------------------------------------------------------------------- Lower Extremity Assessment Details Patient Name: Date of Service: NA Colin Philips [name]. 09/12/2022 3:30 PM Medical Record Number: [id number] Patient Account Number: [id number] Date of Birth/Sex: Treating RN: 12-Apr-1956 (67 y.o. [name] Primary Care Ashish Rossetti: [name] Other Clinician: Referring Selah Zelman: Treating Shevawn Langenberg/Extender: [name] Weeks in Treatment: 10 Edema Assessment Assessed: [Left: No] [Right: No] N[LeftKASRA, HELLBERG [name] ([id number] [[id number].pdf Page 2 of 8] Edema: [Left: Ye] [Right: s] Calf Left: Right: Point of Measurement: From Medial Instep 36 cm Ankle Left: Right: Point of Measurement: From Medial Instep 24 cm Electronic Signature(s) Signed: 09/12/2022 4:49:56 PM By: Adline Peals Entered By: Adline Peals on 09/12/2022 15:55:15 -------------------------------------------------------------------------------- Multi Wound Chart Details Patient Name: Date of Service: NA Colin Philips [name]. 09/12/2022 3:30 PM Medical Record Number: [id number] Patient Account Number: [id number] Date of Birth/Sex: Treating RN: [birth year] (67 y.o. M) Primary Care Prerna Harold: [name] Other Clinician: Referring Edon Hoadley: Treating Koven Belinsky/Extender: [name] Weeks in Treatment: 10 Vital Signs Height(in): 74 Pulse(bpm): 80 Weight(lbs): 187 Blood  Pressure(mmHg): 180/94 Body Mass Index(BMI): 24 Temperature(F): 97.6 Respiratory Rate(breaths/min): 18 [2:Photos:] [3:No Photos] [N/[name]:N/[name]] Left, Medial Ankle Left, Distal Ankle N/[name] Wound Location: Pressure Injury Gradually Appeared N/[name] Wounding Event: Diabetic Wound/Ulcer of the Lower Diabetic Wound/Ulcer of the Lower N/[name] Primary Etiology: Extremity Extremity Cataracts, Coronary Artery Disease, N/[name]/[name] Comorbid History: Hypertension, Myocardial Infarction, Peripheral Arterial Disease, Type II Diabetes, Osteomyelitis, Neuropathy 06/13/2022 08/29/2022 N/[name] Date Acquired: 10 2 N/[name] Weeks of Treatment: Open Converted N/[name] Wound Status: No No N/[name] Wound Recurrence: 2.6x1x0.3 N/[name]/[name] Measurements L x W x D (cm) 2.042 N/[name]/[name] [name] (  cm) : rea 0.613 N/[name]/[name] Volume (cm) : 0.00% N/[name]/[name] % Reduction in [name] rea: -200.50% N/[name]/[name] % Reduction in Volume: Grade 3 Grade 3 N/[name] Classification: Medium Medium N/[name] Exudate [name] mount: Serous Serosanguineous N/[name] Exudate Type: amber red, brown N/[name] Exudate Color: Distinct, outline attached N/[name]/[name] Wound Margin: Large (67-100%) N/[name]/[name] Granulation [name] mount: Pink, Pale N/[name]/[name] Granulation Quality: Small (1-33%) N/[name]/[name] Necrotic [name] mount: Fat Layer (Subcutaneous Tissue): Yes N/[name]/[name] Exposed Structures: Fascia: No Tendon: No Muscle: No Joint: No Bone: No Large (67-100%) N/[name]/[name] Epithelialization: Debridement - Excisional N/[name]/[name] Debridement: [name], [name] ([id number].pdf Page 3 of 8 16:04 N/[name]/[name] Pre-procedure Verification/Time Out Taken: Lidocaine 4% Topical Solution N/[name]/[name] Pain Control: Subcutaneous, Slough N/[name]/[name] Tissue Debrided: Skin/Subcutaneous Tissue N/[name]/[name] Level: 2.6 N/[name]/[name] Debridement [name] (sq cm): rea Curette N/[name]/[name] Instrument: Minimum N/[name]/[name] Bleeding: Pressure N/[name]/[name] Hemostasis [name] chieved: Procedure was tolerated well N/[name]/[name] Debridement Treatment Response: 2.6x1x0.3 N/[name]  N/[name] Post Debridement Measurements L x W x D (cm) 0.613 N/[name]/[name] Post Debridement Volume: (cm) Callus: Yes No Abnormalities Noted N/[name] Periwound Skin Texture: Dry/Scaly: Yes No Abnormalities Noted N/[name] Periwound Skin Moisture: Maceration: No No Abnormalities Noted No Abnormalities Noted N/[name] Periwound Skin Color: No Abnormality N/[name]/[name] Temperature: Debridement N/[name]/[name] Procedures Performed: Treatment Notes Wound #2 (Ankle) Wound Laterality: Left, Medial Cleanser Normal Saline Discharge Instruction: Cleanse the wound with Normal Saline prior to applying [name] clean dressing using gauze sponges, not tissue or cotton balls. Peri-Wound Care Zinc Oxide Ointment 30g tube Discharge Instruction: Apply Zinc Oxide to periwound with each dressing change Sween Lotion (Moisturizing lotion) Discharge Instruction: Apply moisturizing lotion as directed Topical Mupirocin Ointment Discharge Instruction: Apply Mupirocin (Bactroban) as instructed Primary Dressing Sorbalgon AG Dressing 2x2 (in/in) Discharge Instruction: Apply to wound bed as instructed Secondary Dressing Optifoam Non-Adhesive Dressing, 4x4 in Discharge Instruction: Apply over primary dressing as directed. Zetuvit Plus 4x8 in Discharge Instruction: Apply over primary dressing as directed. Secured With Elastic Bandage 4 inch (ACE bandage) Discharge Instruction: Secure with ACE bandage as directed. Kerlix Roll Sterile, 4.5x3.1 (in/yd) Discharge Instruction: Secure with Kerlix as directed. Transpore Surgical Tape, 2x10 (in/yd) Discharge Instruction: Secure dressing with tape as directed. Compression Wrap Compression Stockings Add-Ons Wound #3 (Ankle) Wound Laterality: Left, Distal Cleanser Peri-Wound Care Topical Primary Dressing Secondary Dressing Secured With Compression Colin, BALESTRIERI [name] ([id number]) [id number] Page 4 of 8 Compression Stockings Add-Ons Electronic Signature(s) Signed: 09/12/2022  5:01:40 PM By: [name] MD FACS Entered By: [name] on 09/12/2022 17:01:40 -------------------------------------------------------------------------------- Multi-Disciplinary Care Plan Details Patient Name: Date of Service: NA Colin Philips [name]. 09/12/2022 3:30 PM Medical Record Number: [id number] Patient Account Number: [id number] Date of Birth/Sex: Treating RN: [birth year] (67 y.o. [name] Primary Care Shaunika Italiano: [name] Other Clinician: Referring Kani Chauvin: Treating Maigan Bittinger/Extender: [name] Weeks in Treatment: 10 Active Inactive Pressure Nursing Diagnoses: Knowledge deficit related to causes and risk factors for pressure ulcer development Knowledge deficit related to management of pressures ulcers Goals: Patient will remain free of pressure ulcers Date Initiated: 07/04/2022 Target Resolution Date: 10/14/2022 Goal Status: Active Interventions: Assess: immobility, friction, shearing, incontinence upon admission and as needed Assess potential for pressure ulcer upon admission and as needed Treatment Activities: Patient referred for pressure reduction/relief devices : 07/04/2022 Notes: Wound/Skin Impairment Nursing Diagnoses: Knowledge deficit related to smoking impact on wound healing Goals: Ulcer/skin breakdown will have [name] volume reduction of 30% by week 4 Date  Initiated: 07/04/2022 Target Resolution Date: 10/14/2022 Goal Status: Active Interventions: Assess ulceration(s) every visit Provide education on ulcer and skin care Notes: Electronic Signature(s) Signed: 09/12/2022 4:49:56 PM By: Adline Peals Entered By: Adline Peals on 09/12/2022 16:23:47 -------------------------------------------------------------------------------- Pain Assessment Details Patient Name: Date of Service: NA Colin Philips [name]. 09/12/2022 3:30 PM Medical Record Number: [id number] Patient Account Number: [id number] Date of Birth/Sex: Treating  RN: [birth year] (67 y.o. [name] Primary Care Dartha Rozzell: [name] Other Clinician: Referring Johnte Portnoy: Treating Mykaylah Ballman/Extender: Martyn, Blandin [name] ([id number]) [id number] Page 5 of 8 Weeks in Treatment: 10 Active Problems Location of Pain Severity and Description of Pain Patient Has Paino No Site Locations Rate the pain. Current Pain Level: 0 Pain Management and Medication Current Pain Management: Electronic Signature(s) Signed: 09/12/2022 4:49:56 PM By: Adline Peals Entered By: Adline Peals on 09/12/2022 15:55:11 -------------------------------------------------------------------------------- Patient/Caregiver Education Details Patient Name: Date of Service: NA [name] 4/1/2024andnbsp3:30 PM Medical Record Number: [id number] Patient Account Number: [id number] Date of Birth/Gender: Treating RN: 1955-12-19 (67 y.o. [name] Primary Care Physician: [name] Other Clinician: Referring Physician: Treating Physician/Extender: [name] in Treatment: 10 Education Assessment Education Provided To: Patient Education Topics Provided Wound/Skin Impairment: Methods: Explain/Verbal Responses: Reinforcements needed, State content correctly Electronic Signature(s) Signed: 09/12/2022 4:49:56 PM By: Adline Peals Entered By: Adline Peals on 09/12/2022 16:24:00 -------------------------------------------------------------------------------- Wound Assessment Details Patient Name: Date of Service: NA Colin Philips [name]. 09/12/2022 3:30 PM Medical Record Number: [id number] Patient Account Number: [id number] Date of Birth/Sex: Treating RN: Feb 24, 1956 (67 y.o. [name] Primary Care Massimo Hartland: [name] Other Clinician: Referring Kipper Buch: Treating Samay Delcarlo/Extender: [name] in Treatment: [address] [name] ([id number])  [id number] Page 6 of 8 Wound Status Wound Number: 2 Primary Diabetic Wound/Ulcer of the Lower Extremity Etiology: Wound Location: Left, Medial Ankle Wound Open Wounding Event: Pressure Injury Status: Date Acquired: 06/13/2022 Comorbid Cataracts, Coronary Artery Disease, Hypertension, Myocardial Weeks Of Treatment: 10 History: Infarction, Peripheral Arterial Disease, Type II Diabetes, Clustered Wound: No Osteomyelitis, Neuropathy Photos Wound Measurements Length: (cm) 2.6 Width: (cm) 1 Depth: (cm) 0.3 Area: (cm) 2.042 Volume: (cm) 0.613 % Reduction in Area: 0% % Reduction in Volume: -200.5% Epithelialization: Large (67-100%) Tunneling: No Undermining: No Wound Description Classification: Grade 3 Wound Margin: Distinct, outline attached Exudate Amount: Medium Exudate Type: Serous Exudate Color: amber Foul Odor After Cleansing: No Slough/Fibrino Yes Wound Bed Granulation Amount: Large (67-100%) Exposed Structure Granulation Quality: Pink, Pale Fascia Exposed: No Necrotic Amount: Small (1-33%) Fat Layer (Subcutaneous Tissue) Exposed: Yes Necrotic Quality: Adherent Slough Tendon Exposed: No Muscle Exposed: No Joint Exposed: No Bone Exposed: No Periwound Skin Texture Texture Color No Abnormalities Noted: No No Abnormalities Noted: Yes Callus: Yes Temperature / Pain Temperature: No Abnormality Moisture No Abnormalities Noted: No Dry / Scaly: Yes Maceration: No Treatment Notes Wound #2 (Ankle) Wound Laterality: Left, Medial Cleanser Normal Saline Discharge Instruction: Cleanse the wound with Normal Saline prior to applying [name] clean dressing using gauze sponges, not tissue or cotton balls. Peri-Wound Care Zinc Oxide Ointment 30g tube Discharge Instruction: Apply Zinc Oxide to periwound with each dressing change Sween Lotion (Moisturizing lotion) Discharge Instruction: Apply moisturizing lotion as directed Topical Mupirocin  Ointment Discharge Instruction: Apply Mupirocin (Bactroban) as instructed [name], [name] ([id number] Page 7 of 8 Primary Dressing Sorbalgon AG Dressing 2x2 (in/in) Discharge Instruction: Apply to wound bed as instructed Secondary Dressing Optifoam Non-Adhesive Dressing, 4x4 in Discharge Instruction: Apply  over primary dressing as directed. Zetuvit Plus 4x8 in Discharge Instruction: Apply over primary dressing as directed. Secured With Elastic Bandage 4 inch (ACE bandage) Discharge Instruction: Secure with ACE bandage as directed. Kerlix Roll Sterile, 4.5x3.1 (in/yd) Discharge Instruction: Secure with Kerlix as directed. Transpore Surgical Tape, 2x10 (in/yd) Discharge Instruction: Secure dressing with tape as directed. Compression Wrap Compression Stockings Add-Ons Electronic Signature(s) Signed: 09/12/2022 4:49:56 PM By: Adline Peals Entered By: Adline Peals on 09/12/2022 16:01:46 -------------------------------------------------------------------------------- Wound Assessment Details Patient Name: Date of Service: Colin Loveless [name]. 09/12/2022 3:30 PM Medical Record Number: [id number] Patient Account Number: [id number] Date of Birth/Sex: Treating RN: 22-Mar-1956 (67 y.o. [name] Primary Care Jill Ruppe: [name] Other Clinician: Referring Santiago Graf: Treating Shekina Cordell/Extender: [name] Weeks in Treatment: 10 Wound Status Wound Number: 3 Primary Etiology: Diabetic Wound/Ulcer of the Lower Extremity Wound Location: Left, Distal Ankle Wound Status: Converted Wounding Event: Gradually Appeared Date Acquired: 08/29/2022 Weeks Of Treatment: 2 Clustered Wound: No Wound Description Classification: Grade 3 Exudate Amount: Medium Exudate Type: Serosanguineous Exudate Color: red, brown Periwound Skin Texture Texture Color No Abnormalities Noted: No No Abnormalities Noted: No Moisture No  Abnormalities Noted: No Treatment Notes Wound #3 (Ankle) Wound Laterality: Left, Distal Cleanser Peri-Wound Care Topical Primary Dressing Secondary Dressing [name], [name] ([id number] Page 8 of 8 Secured With Compression Wrap Compression Stockings Environmental education officer) Signed: 09/12/2022 4:49:56 PM By: Adline Peals Entered By: Adline Peals on 09/12/2022 15:57:04 -------------------------------------------------------------------------------- Vitals Details Patient Name: Date of Service: NA Colin [name] 09/12/2022 3:30 PM Medical Record Number: [id number] Patient Account Number: [id number] Date of Birth/Sex: Treating RN: 06-15-1955 (67 y.o. [name] Primary Care Latesha Chesney: [name] Other Clinician: Referring Okey Zelek: Treating Freeland Pracht/Extender: [name] Weeks in Treatment: 10 Vital Signs Time Taken: 15:54 Temperature (F): 97.6 Height (in): 74 Pulse (bpm): 80 Weight (lbs): 187 Respiratory Rate (breaths/min): 18 Body Mass Index (BMI): 24 Blood Pressure (mmHg): 180/94 Reference Range: 80 - 120 mg / dl Electronic Signature(s) Signed: 09/12/2022 4:49:56 PM By: Adline Peals Entered By: Adline Peals on 09/12/2022 15:56:45

## 2022-09-13 NOTE — Progress Notes (Signed)
[name], [name] [name] ([id number]) [id number] Page 1 of 11 Visit Report for 09/12/2022 Chief Complaint Document Details Patient Name: Date of Service: NA [name] 09/12/2022 3:30 PM Medical Record Number: [id number] Patient Account Number: [id number] Date of Birth/Sex: Treating RN: [birth year] (66 y.o. M) Primary Care Provider: [name] Other Clinician: Referring Provider: Treating Provider/Extender: [name] Weeks in Treatment: 10 Information Obtained from: Patient Chief Complaint Patients presents for treatment of an open diabetic ulcer with exposed bone and osteomyelitis 07/04/2022: Diabetic ankle ulcer Electronic Signature(s) Signed: 09/12/2022 5:01:46 PM By: [name] MD FACS Entered By: [name] on 09/12/2022 17:01:46 -------------------------------------------------------------------------------- Debridement Details Patient Name: Date of Service: NA [name]. 09/12/2022 3:30 PM Medical Record Number: [id number] Patient Account Number: [id number] Date of Birth/Sex: Treating RN: 18-May-1956 (67 y.o. [name] Primary Care Provider: [name] Other Clinician: Referring Provider: Treating Provider/Extender: [name] Weeks in Treatment: 10 Debridement Performed for Assessment: Wound #2 Left,Medial Ankle Performed By: Physician [name], MD Debridement Type: Debridement Severity of Tissue Pre Debridement: Fat layer exposed Level of Consciousness (Pre-procedure): Awake and Alert Pre-procedure Verification/Time Out Yes - 16:04 Taken: Start Time: 16:04 Pain Control: Lidocaine 4% T opical Solution T Area Debrided (L x W): otal 2.6 (cm) x 1 (cm) = 2.6 (cm) Tissue and other material debrided: Non-Viable, Slough, Subcutaneous, Skin: Epidermis, Slough Level: Skin/Subcutaneous Tissue Debridement Description: Excisional Instrument: Curette Bleeding: Minimum Hemostasis  Achieved: Pressure Response to Treatment: Procedure was tolerated well Level of Consciousness (Post- Awake and Alert procedure): Post Debridement Measurements of Total Wound Length: (cm) 2.6 Width: (cm) 1 Depth: (cm) 0.3 Volume: (cm) 0.613 Character of Wound/Ulcer Post Debridement: Improved Severity of Tissue Post Debridement: Fat layer exposed Post Procedure Diagnosis Same as Pre-procedure Notes scribed for Dr. Celine Ahr by Adline Peals, RN Electronic Signature(s) Signed: 09/12/2022 4:49:56 PM By: Adline Peals Signed: 09/12/2022 5:04:49 PM By: [name] MD [name], Signed: 09/12/2022 5:04:49 PM By: [name] MD [name] ([id number] Page 2 of 11 Entered By: Adline Peals on 09/12/2022 16:08:10 -------------------------------------------------------------------------------- HPI Details Patient Name: Date of Service: NA [name] 09/12/2022 3:30 PM Medical Record Number: [id number] Patient Account Number: [id number] Date of Birth/Sex: Treating RN: December 14, 1955 (67 y.o. M) Primary Care Provider: [name] Other Clinician: Referring Provider: Treating Provider/Extender: [name] Weeks in Treatment: 10 History of Present Illness HPI Description: ADMISSION 08/25/2021 This is [name] 67 year old man who initially presented to his primary care provider in September 2022 with pain in his left foot. He was sent for an x-ray and while the x-ray was being performed, the tech pointed out [name] wound on his foot that the patient was not aware existed. He does have type 2 diabetes with significant neuropathy. His diabetes is suboptimally controlled with his most recent A1c being 8.5. He also has [name] history of coronary artery disease status post three- vessel CABG. he was initially seen by orthopedics, but they referred him to [facility]. He has undergone at least 7 operations/debridements and  several applications of skin substitute under the care of podiatry. He has been in [name] for much of this time. His most recent procedure was July 28, 2021. [name] was biopsied and was found to be consistent with osteomyelitis. Culture also returned positive for corynebacterium. He was seen on August 16, 2021 by infectious disease. [name] PICC line has been placed and he will  be receiving [name] 6-week course of IV daptomycin and cefepime. In October 2022, he underwent lower extremity vascular studies. Results are copied here: Right: Resting right ankle-brachial index is within normal range. No evidence of significant right lower extremity arterial disease. The right toe-brachial index is abnormal. Left: Resting left ankle-brachial index indicates mild left lower extremity arterial disease. The left toe-brachial index is abnormal. He has not been seen by vascular surgery despite these findings. He presented to clinic today in [name] and is using [name] to offload. Wound VAC was in place. Once this was removed, [name] large ulcer was identified on the left midfoot/ankle. Bone is frankly exposed. There is no malodorous or purulent drainage. There is some granulation tissue over the central portion of the exposed bone. There is [name] tunnel that extends posteriorly for roughly 10 cm. It has been discussed with him by multiple providers that he is at very high risk of losing his lower leg because of this wound. He is extremely eager to avoid this outcome and is here today to review his options as well as receive ongoing wound care. 09/03/2021: Here for reevaluation of his wound. There does not appear to have been any substantial improvement overall since our last visit. He has been in [name] with white foam overlying the exposed bone. We are working on getting him approved for hyperbaric oxygen therapy. 09/10/2021: We are in the process of getting him cleared to begin hyperbaric  oxygen therapy. He still needs to obtain [name] chest x-ray. Although the wound measurements are roughly the same, I think the overall appearance of the wound is better. The exposed bone has [name] bit more granulation tissue covering it. He has not received [name] vascular surgery appointment to reevaluate his flow to the wound. 09/17/2021: He has been approved for hyperbaric oxygen therapy and completed his chest x-ray, which I reviewed and it appears normal. The tunnels at the 12 and 10:00 positions are smaller. There is more granulation tissue covering the exposed bone and the undermining has decreased. He still has not received [name] vascular surgery appointment. 09/24/2021: He initiated hyperbaric oxygen therapy this week and is tolerating it well. He has an appointment with vascular surgery coming up on May 16. The granulation tissue is covering more of the exposed bone and both tunnels are [name] bit smaller. 10/01/2021: He continues to tolerate hyperbaric oxygen therapy. He saw infectious disease and they are planning to pull his PICC line. He has been initiated on oral antibiotics (doxycycline and Augmentin). The wound looks about the same but the tunnels are [name] little bit smaller. The skin seems to be contracting somewhat around the exposed bone. 10/08/2021: The wound is still about the same size, but the tunnels continue to come in and the skin is contracting around the exposed bone. He continues to have some accumulation of necrotic material in the inferoposterior aspect of the wound as well as accumulation at the 12:00 tunnel area. 10/15/2021: The wound is smaller today. The tunnels continue to come in. There is less necrotic tissue present. He does have some periwound maceration. 10/22/2021: The wound is about the same size. There is [name] little bit less undermining at the distal portion. The exposed bone is dark and I am not sure if this is staining from silver nitrate or his VAC sponge or if it represents necrosis. The  tunnels are shallower but he does have some serous drainage coming from the 10:00 tunnel. He continues to  tolerate hyperbaric oxygen therapy well. 10/29/2021: The undermining continues to improve. The tunnels are about the same. He has good granulation tissue overlying the majority of the exposed bone. It does appear that perhaps the tubing from his wound VAC has been eroding the skin at the 12 clock position. He continues to accumulate senescent epithelium around the borders of the wound. 11/05/2021: The undermining is almost completely resolved. The tunnels have contracted fairly significantly. No significant slough or debris accumulation. There is still senescent epithelium accumulation around the borders of the wound. He has been tolerating hyperbaric oxygen therapy well. 11/12/2021: Despite the measurements of the wound being about the same, the wound has changed in its shape and overall, I think it is improved. The undermining has resolved and the tunnels continue to shorten. There is good granulation tissue encroaching over the small area of bone that has remained exposed at the 12 o'clock position. Minimal slough accumulation. He continues to tolerate hyperbaric oxygen therapy well. 11/19/2021: I took [name] PCR culture last week. There was overgrowth of yeast. He is already taking suppressive doxycycline and Augmentin. I added fluconazole to his regimen. The wound is smaller again today. The tunnels continue to shorten. He continues to do well with hyperbaric oxygen therapy. 11/26/2021: For some reason, his foot has become macerated. The wound is narrower but about the same dimensions in its longitudinal aspect. The tunnels continue to shorten. He has some slough buildup on the wound as well as some heaped up senescent epithelium around the perimeter. [name], [name] ([id number]) [id number] Page 3 of 11 12/03/2021: No further maceration of his foot has occurred. The wound has  contracted quite significantly from last week. The tunnel at 10:00 is closed. The tunnel at 12:00 is down to just [name] couple of millimeters. No other undermining is present. There is soft tissue coverage of the previously exposed bone. There is just [name] bit of slough and biofilm on the wound surface. 12/10/2021: The wound is looking good. It turns out the tunnel at 12:00 is only exposed when the patient dorsiflexes his foot. It is about 2 cm in depth when he does this; when his foot is in plantarflexion, the tunnel is closed. The bone that was visible at the 12:00 tunnel is completely covered with granulation tissue, but there does feel like some exposed bone deeper into the tunnel area. There is senescent skin heaped up around the periphery. Minimal slough on the wound surface. 12/16/2021: The wound dimensions are roughly the same. The surface has nice granulation tissue. The exposed bone at the 12:00 tunnel continues to be covered with more soft tissue. 7/14; patient's wound measures smaller today. Using the wound VAC with underlying collagen. He is also being treated with HBO for underlying osteomyelitis. He tells me he is on doxycycline and ampicillin follows with infectious disease next week 12/31/2021: The wound continues to contract. Unfortunately, the area where the track pad and tubing have been rubbing continues to look like it is applying friction. He says that the home health nurses that have been applying the [facility] have been putting gauze underneath the tubing, but nonetheless there is ongoing tissue breakdown at this site. Light slough accumulation on the wound surface. The tunnel continues to contract. He is tolerating HBO without difficulty. 01/07/2022: Bridging the wound VAC away from the ankle has resulted in significant improvement in the tissue at the apex of the wound. The tunnel is still present and is not all that much shorter, but the overall  wound surface is very robust and healthy  looking. Minimal slough accumulation. No concern for acute infection. 01/21/2022: The wound continues to contract and has [name] robust granulation tissue surface. The tunnel has come in considerably and is down to about 1.4 cm. There is still bone exposed within the tunnel but the rest of it is well covered. There is some senescent epithelium at the wound margins and [name] little bit of slough on the surface. 01/28/2022: No significant change in the wound this week, but there has not been any reaccumulation of senescent epithelium or slough. The tunnel is perhaps [name] millimeter less in depth. He has been approved for Apligraf and we will apply this today. 02/04/2022: The wound has contracted somewhat and the tunnel has filled in completely. The wound surface is clean. He is here for Apligraf #2. 02/11/2022: The wound has contracted further and is now nearly flush with the surrounding skin surface. Light layer of slough. Apligraf #3 plan for today. 02/18/2022: There is [name] band of epithelium trying to cut across the superior portion of the wound. There is robust granulation tissue with just [name] light layer of slough and biofilm on the surface. There was [name] little bit of greenish drainage in the wound VAC but none appreciated on the site itself. He is here for Apligraf #4. 03/04/2022: The wound has contracted considerably. There is good granulation tissue on the surface. Minimal biofilm. He is here for Apligraf #5. 03/18/2022: The wound has epithelialized to the point that it has been divided into 2 areas. Both areas are smaller in total than at his previous visit. There is good granulation tissue present with just [name] little bit of periwound eschar and surface slough. 10/13; left medial foot. The patient has completed treatment with hyperbaric oxygen for underlying osteomyelitis he has had [name] nice response in the wound. Noted today that he still had some greenish drainage. Previously he has been treated with topical gentamicin  but apparently that was stopped 2 weeks ago. Fortunately the wound is measuring smaller 10/20; wound looks better no hypergranulation minimal drainage. Granulation tissue looks healthy using gentamicin Hydrofera Blue under compression 04/08/2022: The wound continues to contract tremendously. The response to his treatment for hypertrophic granulation tissue has been quite remarkable. There are just [name] couple of open areas remaining. 04/18/2022: His wound is healed. READMISSION 07/04/2022: He returns to clinic with [name] new ulcer adjacent to where his previous large wound had been. He says that he was wearing boots and using [name] small padded dressing to protect the freshly healed ankle wound. Apparently the dressing rolled up and rubbed on his skin, causing [name] new wound. He contacted our office last week concerned about the appearance. There is [name] small oval wound on his left medial ankle. It is fairly clean with some periwound dry skin and eschar. 07/11/2022: The wound is about the same size this week. There is [name] layer of rubbery slough on the surface. It has [name] fairly strong odor. 07/18/2022: The wound measured larger this week due to small satellite areas opening in [name] fan pattern distal to the primary wound. He continues to accumulate slough on the wound surface. The culture that I took last week only grew out low levels of skin flora. 07/25/2022: The satellite areas that had opened last week have closed. The main wound is smaller with some slough accumulation. 08/01/2022: The wound continues to contract. There is [name] layer of slough on the surface. 08/08/2022: The wound is [name] little bit  smaller again this week. Still with slough buildup. 3/18; patient presents for follow-up. He has been doing dressing changes on his own with silver alginate and antibiotic ointment. He has been at [employer] over the past 3 weeks. He has developed [name] new wound. 09/05/2022: I am not sure if the new wound that was reported last week  is just the satellite area that had closed [name] couple of weeks ago and reopened. Regardless, both wounds are quite small with good epithelialization. There is some eschar buildup around the periphery of both wounds with slough on both wound surfaces. 09/12/2022: The wounds have converged. There is some increased in depth at the distal portion. This seems to be secondary to moisture accumulation as the periwound skin is [name] bit macerated. Electronic Signature(s) Signed: 09/12/2022 5:02:30 PM By: [name] MD FACS Entered By: [name] on 09/12/2022 17:02:30 Colin Harms [name] ([id number] Page 4 of 11 -------------------------------------------------------------------------------- Physical Exam Details Patient Name: Date of Service: Colin Loveless [name]. 09/12/2022 3:30 PM Medical Record Number: [id number] Patient Account Number: [id number] Date of Birth/Sex: Treating RN: August 18, 1955 (67 y.o. M) Primary Care Provider: [name] Other Clinician: Referring Provider: Treating Provider/Extender: [name] Weeks in Treatment: 10 Constitutional Hypertensive, asymptomatic. . . . no acute distress. Respiratory Normal work of breathing on room air. Notes 09/12/2022: The wounds have converged. There is some increased in depth at the distal portion. This seems to be secondary to moisture accumulation as the periwound skin is [name] bit macerated. Electronic Signature(s) Signed: 09/12/2022 5:03:01 PM By: [name] MD FACS Entered By: [name] on 09/12/2022 17:03:01 -------------------------------------------------------------------------------- Physician Orders Details Patient Name: Date of Service: NA [name]. 09/12/2022 3:30 PM Medical Record Number: [id number] Patient Account Number: [id number] Date of Birth/Sex: Treating RN: 1955/10/29 (67 y.o. [name] Primary Care Provider: [name] Other  Clinician: Referring Provider: Treating Provider/Extender: [name] Weeks in Treatment: 10 Verbal / Phone Orders: No Diagnosis Coding ICD-10 Coding Code Description [phone] Non-pressure chronic ulcer of left ankle with fat layer exposed I73.9 Peripheral vascular disease, unspecified E11.622 Type 2 diabetes mellitus with other skin ulcer Follow-up Appointments ppointment in 1 week. - Dr. Celine Ahr - room 2 Return [name] Anesthetic (In clinic) Topical Lidocaine 4% applied to wound bed Bathing/ Shower/ Hygiene May shower and wash wound with soap and water. Edema Control - Lymphedema / SCD / Other Elevate legs to the level of the heart or above for 30 minutes daily and/or when sitting for 3-4 times [name] day throughout the day. Avoid standing for long periods of time. Off-Loading Other: - knee scooter to left leg Wound Treatment Wound #2 - Ankle Wound Laterality: Left, Medial Cleanser: Normal Saline Every Other Day/30 Days Discharge Instructions: Cleanse the wound with Normal Saline prior to applying [name] clean dressing using gauze sponges, not tissue or cotton balls. Peri-Wound Care: Zinc Oxide Ointment 30g tube Every Other Day/30 Days Discharge Instructions: Apply Zinc Oxide to periwound with each dressing change Peri-Wound Care: Sween Lotion (Moisturizing lotion) Every Other Day/30 Days Discharge Instructions: Apply moisturizing lotion as directed Topical: Mupirocin Ointment Every Other Day/30 Days Discharge Instructions: Apply Mupirocin (Bactroban) as instructed [name], [name] ([id number]) [phone].pdf Page 5 of 11 Prim Dressing: Maxorb Extra Ag+ Alginate Dressing, 2x2 (in/in) (DME) (Generic) Every Other Day/30 Days ary Discharge Instructions: Apply to wound bed as instructed Secondary Dressing: Optifoam Non-Adhesive Dressing, 4x4 in (DME) (Generic) Every Other Day/30 Days Discharge Instructions: Apply over primary  dressing as  directed. Secondary Dressing: Zetuvit Plus 4x8 in (DME) (Generic) Every Other Day/30 Days Discharge Instructions: Apply over primary dressing as directed. Secured With: Elastic Bandage 4 inch (ACE bandage) (DME) (Generic) Every Other Day/30 Days Discharge Instructions: Secure with ACE bandage as directed. Secured With: [employer], 4.5x3.1 (in/yd) (DME) (Generic) Every Other Day/30 Days Discharge Instructions: Secure with Kerlix as directed. Secured With: 52M Medipore H Soft Cloth Surgical T ape, 4 x 10 (in/yd) (DME) (Generic) Every Other Day/30 Days Discharge Instructions: Secure with tape as directed. Patient Medications llergies: codeine [name] Notifications Medication Indication Start End 09/12/2022 lidocaine DOSE topical 4 % cream - cream topical Electronic Signature(s) Signed: 09/12/2022 5:04:49 PM By: [name] MD FACS Previous Signature: 09/12/2022 4:49:56 PM Version By: Adline Peals Entered By: [name] on 09/12/2022 17:03:21 -------------------------------------------------------------------------------- Problem List Details Patient Name: Date of Service: NA [name]. 09/12/2022 3:30 PM Medical Record Number: [id number] Patient Account Number: [id number] Date of Birth/Sex: Treating RN: 1955-09-20 (67 y.o. M) Primary Care Provider: [name] Other Clinician: Referring Provider: Treating Provider/Extender: [name] Weeks in Treatment: 10 Active Problems ICD-10 Encounter Code Description Active Date MDM Diagnosis L97.322 Non-pressure chronic ulcer of left ankle with fat layer exposed 07/04/2022 No Yes I73.9 Peripheral vascular disease, unspecified 07/04/2022 No Yes E11.622 Type 2 diabetes mellitus with other skin ulcer 07/04/2022 No Yes Inactive Problems Resolved Problems Electronic Signature(s) Signed: 09/12/2022 5:01:27 PM By: [name] MD FACS Entered By: [name] on 09/12/2022 17:01:27 Colin Harms [name]  ([id number]) [id number] Page 6 of 11 -------------------------------------------------------------------------------- Progress Note Details Patient Name: Date of Service: NA [name]. 09/12/2022 3:30 PM Medical Record Number: [id number] Patient Account Number: [id number] Date of Birth/Sex: Treating RN: [birth year] (67 y.o. M) Primary Care Provider: [name] Other Clinician: Referring Provider: Treating Provider/Extender: [name] Weeks in Treatment: 10 Subjective Chief Complaint Information obtained from Patient Patients presents for treatment of an open diabetic ulcer with exposed bone and osteomyelitis 07/04/2022: Diabetic ankle ulcer History of Present Illness (HPI) ADMISSION 08/25/2021 This is [name] 67 year old man who initially presented to his primary care provider in September 2022 with pain in his left foot. He was sent for an x-ray and while the x-ray was being performed, the tech pointed out [name] wound on his foot that the patient was not aware existed. He does have type 2 diabetes with significant neuropathy. His diabetes is suboptimally controlled with his most recent A1c being 8.5. He also has [name] history of coronary artery disease status post three- vessel CABG. he was initially seen by orthopedics, but they referred him to [facility]. He has undergone at least 7 operations/debridements and several applications of skin substitute under the care of podiatry. He has been in [name] for much of this time. His most recent procedure was July 28, 2021. [name] was biopsied and was found to be consistent with osteomyelitis. Culture also returned positive for corynebacterium. He was seen on August 16, 2021 by infectious disease. [name] PICC line has been placed and he will be receiving [name] 6-week course of IV daptomycin and cefepime. In October 2022, he underwent lower extremity vascular studies. Results are  copied here: Right: Resting right ankle-brachial index is within normal range. No evidence of significant right lower extremity arterial disease. The right toe-brachial index is abnormal. Left: Resting left ankle-brachial index indicates mild left lower extremity arterial disease. The left toe-brachial index  is abnormal. He has not been seen by vascular surgery despite these findings. He presented to clinic today in [name] and is using [name] to offload. Wound VAC was in place. Once this was removed, [name] large ulcer was identified on the left midfoot/ankle. Bone is frankly exposed. There is no malodorous or purulent drainage. There is some granulation tissue over the central portion of the exposed bone. There is [name] tunnel that extends posteriorly for roughly 10 cm. It has been discussed with him by multiple providers that he is at very high risk of losing his lower leg because of this wound. He is extremely eager to avoid this outcome and is here today to review his options as well as receive ongoing wound care. 09/03/2021: Here for reevaluation of his wound. There does not appear to have been any substantial improvement overall since our last visit. He has been in [name] with white foam overlying the exposed bone. We are working on getting him approved for hyperbaric oxygen therapy. 09/10/2021: We are in the process of getting him cleared to begin hyperbaric oxygen therapy. He still needs to obtain [name] chest x-ray. Although the wound measurements are roughly the same, I think the overall appearance of the wound is better. The exposed bone has [name] bit more granulation tissue covering it. He has not received [name] vascular surgery appointment to reevaluate his flow to the wound. 09/17/2021: He has been approved for hyperbaric oxygen therapy and completed his chest x-ray, which I reviewed and it appears normal. The tunnels at the 12 and 10:00 positions are smaller. There is more granulation tissue  covering the exposed bone and the undermining has decreased. He still has not received [name] vascular surgery appointment. 09/24/2021: He initiated hyperbaric oxygen therapy this week and is tolerating it well. He has an appointment with vascular surgery coming up on May 16. The granulation tissue is covering more of the exposed bone and both tunnels are [name] bit smaller. 10/01/2021: He continues to tolerate hyperbaric oxygen therapy. He saw infectious disease and they are planning to pull his PICC line. He has been initiated on oral antibiotics (doxycycline and Augmentin). The wound looks about the same but the tunnels are [name] little bit smaller. The skin seems to be contracting somewhat around the exposed bone. 10/08/2021: The wound is still about the same size, but the tunnels continue to come in and the skin is contracting around the exposed bone. He continues to have some accumulation of necrotic material in the inferoposterior aspect of the wound as well as accumulation at the 12:00 tunnel area. 10/15/2021: The wound is smaller today. The tunnels continue to come in. There is less necrotic tissue present. He does have some periwound maceration. 10/22/2021: The wound is about the same size. There is [name] little bit less undermining at the distal portion. The exposed bone is dark and I am not sure if this is staining from silver nitrate or his VAC sponge or if it represents necrosis. The tunnels are shallower but he does have some serous drainage coming from the 10:00 tunnel. He continues to tolerate hyperbaric oxygen therapy well. 10/29/2021: The undermining continues to improve. The tunnels are about the same. He has good granulation tissue overlying the majority of the exposed bone. It does appear that perhaps the tubing from his wound VAC has been eroding the skin at the 12 clock position. He continues to accumulate senescent epithelium around the borders of the wound. 11/05/2021: The  undermining is almost  completely resolved. The tunnels have contracted fairly significantly. No significant slough or debris accumulation. There is still senescent epithelium accumulation around the borders of the wound. He has been tolerating hyperbaric oxygen therapy well. 11/12/2021: Despite the measurements of the wound being about the same, the wound has changed in its shape and overall, I think it is improved. The undermining has resolved and the tunnels continue to shorten. There is good granulation tissue encroaching over the small area of bone that has remained exposed at the 12 o'clock position. Minimal slough accumulation. He continues to tolerate hyperbaric oxygen therapy well. 11/19/2021: I took [name] PCR culture last week. There was overgrowth of yeast. He is already taking suppressive doxycycline and Augmentin. I added fluconazole to his regimen. The wound is smaller again today. The tunnels continue to shorten. He continues to do well with hyperbaric oxygen therapy. 11/26/2021: For some reason, his foot has become macerated. The wound is narrower but about the same dimensions in its longitudinal aspect. The tunnels continue to shorten. He has some slough buildup on the wound as well as some heaped up senescent epithelium around the perimeter. [name], [name] ([id number]) [id number] Page 7 of 11 12/03/2021: No further maceration of his foot has occurred. The wound has contracted quite significantly from last week. The tunnel at 10:00 is closed. The tunnel at 12:00 is down to just [name] couple of millimeters. No other undermining is present. There is soft tissue coverage of the previously exposed bone. There is just [name] bit of slough and biofilm on the wound surface. 12/10/2021: The wound is looking good. It turns out the tunnel at 12:00 is only exposed when the patient dorsiflexes his foot. It is about 2 cm in depth when he does this; when his foot is in plantarflexion, the tunnel is closed. The  bone that was visible at the 12:00 tunnel is completely covered with granulation tissue, but there does feel like some exposed bone deeper into the tunnel area. There is senescent skin heaped up around the periphery. Minimal slough on the wound surface. 12/16/2021: The wound dimensions are roughly the same. The surface has nice granulation tissue. The exposed bone at the 12:00 tunnel continues to be covered with more soft tissue. 7/14; patient's wound measures smaller today. Using the wound VAC with underlying collagen. He is also being treated with HBO for underlying osteomyelitis. He tells me he is on doxycycline and ampicillin follows with infectious disease next week 12/31/2021: The wound continues to contract. Unfortunately, the area where the track pad and tubing have been rubbing continues to look like it is applying friction. He says that the home health nurses that have been applying the [facility] have been putting gauze underneath the tubing, but nonetheless there is ongoing tissue breakdown at this site. Light slough accumulation on the wound surface. The tunnel continues to contract. He is tolerating HBO without difficulty. 01/07/2022: Bridging the wound VAC away from the ankle has resulted in significant improvement in the tissue at the apex of the wound. The tunnel is still present and is not all that much shorter, but the overall wound surface is very robust and healthy looking. Minimal slough accumulation. No concern for acute infection. 01/21/2022: The wound continues to contract and has [name] robust granulation tissue surface. The tunnel has come in considerably and is down to about 1.4 cm. There is still bone exposed within the tunnel but the rest of it is well covered. There is some senescent epithelium  at the wound margins and [name] little bit of slough on the surface. 01/28/2022: No significant change in the wound this week, but there has not been any reaccumulation of senescent epithelium or  slough. The tunnel is perhaps [name] millimeter less in depth. He has been approved for Apligraf and we will apply this today. 02/04/2022: The wound has contracted somewhat and the tunnel has filled in completely. The wound surface is clean. He is here for Apligraf #2. 02/11/2022: The wound has contracted further and is now nearly flush with the surrounding skin surface. Light layer of slough. Apligraf #3 plan for today. 02/18/2022: There is [name] band of epithelium trying to cut across the superior portion of the wound. There is robust granulation tissue with just [name] light layer of slough and biofilm on the surface. There was [name] little bit of greenish drainage in the wound VAC but none appreciated on the site itself. He is here for Apligraf #4. 03/04/2022: The wound has contracted considerably. There is good granulation tissue on the surface. Minimal biofilm. He is here for Apligraf #5. 03/18/2022: The wound has epithelialized to the point that it has been divided into 2 areas. Both areas are smaller in total than at his previous visit. There is good granulation tissue present with just [name] little bit of periwound eschar and surface slough. 10/13; left medial foot. The patient has completed treatment with hyperbaric oxygen for underlying osteomyelitis he has had [name] nice response in the wound. Noted today that he still had some greenish drainage. Previously he has been treated with topical gentamicin but apparently that was stopped 2 weeks ago. Fortunately the wound is measuring smaller 10/20; wound looks better no hypergranulation minimal drainage. Granulation tissue looks healthy using gentamicin Hydrofera Blue under compression 04/08/2022: The wound continues to contract tremendously. The response to his treatment for hypertrophic granulation tissue has been quite remarkable. There are just [name] couple of open areas remaining. 04/18/2022: His wound is healed. READMISSION 07/04/2022: He returns to clinic with [name] new ulcer  adjacent to where his previous large wound had been. He says that he was wearing boots and using [name] small padded dressing to protect the freshly healed ankle wound. Apparently the dressing rolled up and rubbed on his skin, causing [name] new wound. He contacted our office last week concerned about the appearance. There is [name] small oval wound on his left medial ankle. It is fairly clean with some periwound dry skin and eschar. 07/11/2022: The wound is about the same size this week. There is [name] layer of rubbery slough on the surface. It has [name] fairly strong odor. 07/18/2022: The wound measured larger this week due to small satellite areas opening in [name] fan pattern distal to the primary wound. He continues to accumulate slough on the wound surface. The culture that I took last week only grew out low levels of skin flora. 07/25/2022: The satellite areas that had opened last week have closed. The main wound is smaller with some slough accumulation. 08/01/2022: The wound continues to contract. There is [name] layer of slough on the surface. 08/08/2022: The wound is [name] little bit smaller again this week. Still with slough buildup. 3/18; patient presents for follow-up. He has been doing dressing changes on his own with silver alginate and antibiotic ointment. He has been at [employer] over the past 3 weeks. He has developed [name] new wound. 09/05/2022: I am not sure if the new wound that was reported last week is just the satellite  area that had closed [name] couple of weeks ago and reopened. Regardless, both wounds are quite small with good epithelialization. There is some eschar buildup around the periphery of both wounds with slough on both wound surfaces. 09/12/2022: The wounds have converged. There is some increased in depth at the distal portion. This seems to be secondary to moisture accumulation as the periwound skin is [name] bit macerated. Patient History Information obtained from Patient. Family History Cancer - Father, Diabetes  - Father,Mother,Paternal Grandparents, Heart Disease - Father, Hypertension - Father, No family history of Hereditary Spherocytosis, Kidney Disease, Lung Disease, Seizures, Stroke, Thyroid Problems, Tuberculosis. Social History Never smoker, Marital Status - Married, Alcohol Use - Rarely, Drug Use - No History, Caffeine Use - Daily. [name], [name] ([id number]) [id number] Page 8 of 11 Medical History Eyes Patient has history of Cataracts - Removed 2008 Cardiovascular Patient has history of Coronary Artery Disease, Hypertension, Myocardial Infarction, Peripheral Arterial Disease Endocrine Patient has history of Type II Diabetes Musculoskeletal Patient has history of Osteomyelitis Neurologic Patient has history of Neuropathy Medical [name] Surgical History Notes nd Cardiovascular Hypercholesterolemia Abnormal EKG CABG X3 2019 Gastrointestinal GERD Musculoskeletal Diabetic foot ulcer Objective Constitutional Hypertensive, asymptomatic. no acute distress. Vitals Time Taken: 3:54 PM, Height: 74 in, Weight: 187 lbs, BMI: 24, Temperature: 97.6 F, Pulse: 80 bpm, Respiratory Rate: 18 breaths/min, Blood Pressure: 180/94 mmHg. Respiratory Normal work of breathing on room air. General Notes: 09/12/2022: The wounds have converged. There is some increased in depth at the distal portion. This seems to be secondary to moisture accumulation as the periwound skin is [name] bit macerated. Integumentary (Hair, Skin) Wound #2 status is Open. Original cause of wound was Pressure Injury. The date acquired was: 06/13/2022. The wound has been in treatment 10 weeks. The wound is located on the Left,Medial Ankle. The wound measures 2.6cm length x 1cm width x 0.3cm depth; 2.042cm^2 area and 0.613cm^3 volume. There is Fat Layer (Subcutaneous Tissue) exposed. There is no tunneling or undermining noted. There is [name] medium amount of serous drainage noted. The wound margin is distinct with the  outline attached to the wound base. There is large (67-100%) pink, pale granulation within the wound bed. There is [name] small (1-33%) amount of necrotic tissue within the wound bed including Adherent Slough. The periwound skin appearance had no abnormalities noted for color. The periwound skin appearance exhibited: Callus, Dry/Scaly. The periwound skin appearance did not exhibit: Maceration. Periwound temperature was noted as No Abnormality. Wound #3 status is Converted. Original cause of wound was Gradually Appeared. The date acquired was: 08/29/2022. The wound has been in treatment 2 weeks. The wound is located on the Left,Distal Ankle. There is [name] medium amount of serosanguineous drainage noted. Assessment Active Problems ICD-10 Non-pressure chronic ulcer of left ankle with fat layer exposed Peripheral vascular disease, unspecified Type 2 diabetes mellitus with other skin ulcer Procedures Wound #2 Pre-procedure diagnosis of Wound #2 is [name] Diabetic Wound/Ulcer of the Lower Extremity located on the Left,Medial Ankle .Severity of Tissue Pre Debridement is: Fat layer exposed. There was [name] Excisional Skin/Subcutaneous Tissue Debridement with [name] total area of 2.6 sq cm performed by [name], MD. With the following instrument(s): Curette to remove Non-Viable tissue/material. Material removed includes Subcutaneous Tissue, Slough, and Skin: Epidermis after achieving pain control using Lidocaine 4% T opical Solution. No specimens were taken. [name] time out was conducted at 16:04, prior to the start of the procedure. [name] Minimum amount of bleeding was controlled with Pressure. The procedure was tolerated  well. Post Debridement Measurements: 2.6cm length x 1cm width x 0.3cm depth; 0.613cm^3 volume. Character of Wound/Ulcer Post Debridement is improved. Severity of Tissue Post Debridement is: Fat layer exposed. Post procedure Diagnosis Wound #2: Same as Pre-Procedure General Notes: scribed for Dr. Celine Ahr by  Adline Peals, RN. [name], [name] ([id number]) [id number] Page 9 of 11 Plan Follow-up Appointments: Return Appointment in 1 week. - Dr. Celine Ahr - room 2 Anesthetic: (In clinic) Topical Lidocaine 4% applied to wound bed Bathing/ Shower/ Hygiene: May shower and wash wound with soap and water. Edema Control - Lymphedema / SCD / Other: Elevate legs to the level of the heart or above for 30 minutes daily and/or when sitting for 3-4 times [name] day throughout the day. Avoid standing for long periods of time. Off-Loading: Other: - knee scooter to left leg The following medication(s) was prescribed: lidocaine topical 4 % cream cream topical was prescribed at facility WOUND #2: - Ankle Wound Laterality: Left, Medial Cleanser: Normal Saline Every Other Day/30 Days Discharge Instructions: Cleanse the wound with Normal Saline prior to applying [name] clean dressing using gauze sponges, not tissue or cotton balls. Peri-Wound Care: Zinc Oxide Ointment 30g tube Every Other Day/30 Days Discharge Instructions: Apply Zinc Oxide to periwound with each dressing change Peri-Wound Care: Sween Lotion (Moisturizing lotion) Every Other Day/30 Days Discharge Instructions: Apply moisturizing lotion as directed Topical: Mupirocin Ointment Every Other Day/30 Days Discharge Instructions: Apply Mupirocin (Bactroban) as instructed Prim Dressing: Maxorb Extra Ag+ Alginate Dressing, 2x2 (in/in) (DME) (Generic) Every Other Day/30 Days ary Discharge Instructions: Apply to wound bed as instructed Secondary Dressing: Optifoam Non-Adhesive Dressing, 4x4 in (DME) (Generic) Every Other Day/30 Days Discharge Instructions: Apply over primary dressing as directed. Secondary Dressing: Zetuvit Plus 4x8 in (DME) (Generic) Every Other Day/30 Days Discharge Instructions: Apply over primary dressing as directed. Secured With: Elastic Bandage 4 inch (ACE bandage) (DME) (Generic) Every Other Day/30  Days Discharge Instructions: Secure with ACE bandage as directed. Secured With: [employer], 4.5x3.1 (in/yd) (DME) (Generic) Every Other Day/30 Days Discharge Instructions: Secure with Kerlix as directed. Secured With: 53M Medipore H Soft Cloth Surgical T ape, 4 x 10 (in/yd) (DME) (Generic) Every Other Day/30 Days Discharge Instructions: Secure with tape as directed. 09/12/2022: The wounds have converged. There is some increased in depth at the distal portion. This seems to be secondary to moisture accumulation as the periwound skin is [name] bit macerated. I used [name] curette to debride slough, subcutaneous tissue, and skin. We will continue topical mupirocin with silver alginate. We will apply zinc oxide to the periwound to try and protect the skin [name] little bit better and use [name] more absorbent outer layer. Follow-up in 1 week. Electronic Signature(s) Signed: 09/12/2022 5:04:13 PM By: [name] MD FACS Entered By: [name] on 09/12/2022 17:04:13 -------------------------------------------------------------------------------- HxROS Details Patient Name: Date of Service: NA [name], [name]. 09/12/2022 3:30 PM Medical Record Number: [id number] Patient Account Number: [id number] Date of Birth/Sex: Treating RN: [birth year] (67 y.o. M) Primary Care Provider: [name] Other Clinician: Referring Provider: Treating Provider/Extender: [name] Weeks in Treatment: 10 Information Obtained From Patient Eyes Medical History: Positive for: Cataracts - Removed 2008 Cardiovascular Medical History: Positive for: Coronary Artery Disease; Hypertension; Myocardial Infarction; Peripheral Arterial Disease Past Medical History Notes: Hypercholesterolemia Abnormal EKG CABG X3 2019 [name], [name] ([id number]) [phone].pdf Page 10 of 11 Gastrointestinal Medical History: Past Medical History Notes: GERD Endocrine Medical History: Positive for:  Type II Diabetes Time with diabetes: 24  years Treated with: Insulin, Oral agents Blood sugar tested every day: Yes Tested : Musculoskeletal Medical History: Positive for: Osteomyelitis Past Medical History Notes: Diabetic foot ulcer Neurologic Medical History: Positive for: Neuropathy HBO Extended History Items Eyes: Cataracts Immunizations Pneumococcal Vaccine: Received Pneumococcal Vaccination: Yes Received Pneumococcal Vaccination On or After 60th Birthday: Yes Implantable Devices Yes Family and Social History Cancer: Yes - Father; Diabetes: Yes - Father,Mother,Paternal Grandparents; Heart Disease: Yes - Father; Hereditary Spherocytosis: No; Hypertension: Yes - Father; Kidney Disease: No; Lung Disease: No; Seizures: No; Stroke: No; Thyroid Problems: No; Tuberculosis: No; Never smoker; Marital Status - Married; Alcohol Use: Rarely; Drug Use: No History; Caffeine Use: Daily; Financial Concerns: No; Food, Clothing or Shelter Needs: No; Support System Lacking: No; Transportation Concerns: No Electronic Signature(s) Signed: 09/12/2022 5:04:49 PM By: [name] MD FACS Entered By: [name] on 09/12/2022 17:02:38 -------------------------------------------------------------------------------- SuperBill Details Patient Name: Date of Service: NA Colin [name] 09/12/2022 Medical Record Number: [id number] Patient Account Number: [id number] Date of Birth/Sex: Treating RN: Jul 20, 1955 (67 y.o. M) Primary Care Provider: [name] Other Clinician: Referring Provider: Treating Provider/Extender: [name] Weeks in Treatment: 10 Diagnosis Coding ICD-10 Codes Code Description [phone] Non-pressure chronic ulcer of left ankle with fat layer exposed I73.9 Peripheral vascular disease, unspecified E11.622 Type 2 diabetes mellitus with other skin ulcer Facility Procedures : EKREM, CANTARELLA Code: [id number] Kaladin [name] ([id number]) Description: [id number] - [place] TISSUE  20 SQ CM/< [id number] Modifier: [id number] Quantity: 1 .pdf Page 11 of 11 : CPT4 Code: IC L Description: D-10 Diagnosis Description 97.322 Non-pressure chronic ulcer of left ankle with fat layer exposed Modifier: Quantity: Physician Procedures : CPT4 Code Description Modifier [id number] - WC PHYS LEVEL 4 - EST PT 25 ICD-10 Diagnosis Description [id number] Non-pressure chronic ulcer of left ankle with fat layer exposed I73.9 Peripheral vascular disease, unspecified E11.622 Type 2 diabetes  mellitus with other skin ulcer Quantity: 1 : [id number] - WC PHYS SUBQ TISS 20 SQ CM ICD-10 Diagnosis Description [id number] Non-pressure chronic ulcer of left ankle with fat layer exposed Quantity: 1 Electronic Signature(s) Signed: 09/12/2022 5:04:30 PM By: [name] MD FACS Entered By: [name] on 09/12/2022 17:04:30

## 2022-09-14 DIAGNOSIS — L97322 Non-pressure chronic ulcer of left ankle with fat layer exposed: Secondary | ICD-10-CM | POA: Diagnosis not present

## 2022-09-19 ENCOUNTER — Ambulatory Visit (HOSPITAL_BASED_OUTPATIENT_CLINIC_OR_DEPARTMENT_OTHER): Payer: BC Managed Care – PPO | Admitting: General Surgery

## 2022-09-26 ENCOUNTER — Encounter (HOSPITAL_BASED_OUTPATIENT_CLINIC_OR_DEPARTMENT_OTHER): Payer: BC Managed Care – PPO | Admitting: General Surgery

## 2022-09-26 DIAGNOSIS — E1140 Type 2 diabetes mellitus with diabetic neuropathy, unspecified: Secondary | ICD-10-CM | POA: Diagnosis not present

## 2022-09-26 DIAGNOSIS — L97322 Non-pressure chronic ulcer of left ankle with fat layer exposed: Secondary | ICD-10-CM | POA: Diagnosis not present

## 2022-09-26 DIAGNOSIS — I10 Essential (primary) hypertension: Secondary | ICD-10-CM | POA: Diagnosis not present

## 2022-09-26 DIAGNOSIS — Z951 Presence of aortocoronary bypass graft: Secondary | ICD-10-CM | POA: Diagnosis not present

## 2022-09-26 DIAGNOSIS — I252 Old myocardial infarction: Secondary | ICD-10-CM | POA: Diagnosis not present

## 2022-09-26 DIAGNOSIS — E11622 Type 2 diabetes mellitus with other skin ulcer: Secondary | ICD-10-CM | POA: Diagnosis not present

## 2022-09-26 DIAGNOSIS — I2510 Atherosclerotic heart disease of native coronary artery without angina pectoris: Secondary | ICD-10-CM | POA: Diagnosis not present

## 2022-09-26 DIAGNOSIS — E1151 Type 2 diabetes mellitus with diabetic peripheral angiopathy without gangrene: Secondary | ICD-10-CM | POA: Diagnosis not present

## 2022-09-26 NOTE — Progress Notes (Signed)
MERRITT, MCCRAVY A ([id number]) [id number] Page 1 of 11 Visit Report for 09/26/2022 Chief Complaint Document Details Patient Name: Date of Service: NA [name] 09/26/2022 2:45 PM Medical Record Number: [id number] Patient Account Number: [id number] Date of Birth/Sex: Treating RN: Jul 14, 1955 (67 y.o. M) Primary Care Provider: [name] Other Clinician: Referring Provider: Treating Provider/Extender: [name] in Treatment: 12 Information Obtained from: Patient Chief Complaint Patients presents for treatment of an open diabetic ulcer with exposed bone and osteomyelitis 07/04/2022: Diabetic ankle ulcer Electronic Signature(s) Signed: 09/26/2022 3:23:35 PM By: [name] MD FACS Entered By: [name] on 09/26/2022 15:23:35 -------------------------------------------------------------------------------- Debridement Details Patient Name: Date of Service: NA Colin Nephew A. 09/26/2022 2:45 PM Medical Record Number: [id number] Patient Account Number: [id number] Date of Birth/Sex: Treating RN: [birth year] (67 y.o. [name] Primary Care Provider: [name] Other Clinician: Referring Provider: Treating Provider/Extender: [name] Weeks in Treatment: 12 Debridement Performed for Assessment: Wound #2 Left,Medial Ankle Performed By: Physician [name], MD Debridement Type: Debridement Severity of Tissue Pre Debridement: Fat layer exposed Level of Consciousness (Pre-procedure): Awake and Alert Pre-procedure Verification/Time Out Yes - 14:48 Taken: Start Time: 14:48 Pain Control: Lidocaine 4% T opical Solution T Area Debrided (L x W): otal 4.1 (cm) x 1.6 (cm) = 6.56 (cm) Tissue and other material debrided: Non-Viable, Slough, Slough Level: Non-Viable Tissue Debridement Description: Selective/Open Wound Instrument: Curette Bleeding: Minimum Hemostasis Achieved: Pressure Response  to Treatment: Procedure was tolerated well Level of Consciousness (Post- Awake and Alert procedure): Post Debridement Measurements of Total Wound Length: (cm) 4.1 Width: (cm) 1.6 Depth: (cm) 0.3 Volume: (cm) 1.546 Character of Wound/Ulcer Post Debridement: Improved Severity of Tissue Post Debridement: Fat layer exposed Post Procedure Diagnosis Same as Pre-procedure Notes scribed for Dr. Lady Gary by Samuella Bruin, RN Electronic Signature(s) Signed: 09/26/2022 3:09:24 PM By: Samuella Bruin Signed: 09/26/2022 4:00:06 PM By: [name] MD [name], Signed: 09/26/2022 4:00:06 PM By: [name] MD FACS [name] ([id number]) [id number] Page 2 of 11 Entered By: Samuella Bruin on 09/26/2022 15:03:36 -------------------------------------------------------------------------------- HPI Details Patient Name: Date of Service: NA [name] 09/26/2022 2:45 PM Medical Record Number: [id number] Patient Account Number: [id number] Date of Birth/Sex: Treating RN: April 12, 1956 (67 y.o. M) Primary Care Provider: [name] Other Clinician: Referring Provider: Treating Provider/Extender: [name] in Treatment: 12 History of Present Illness HPI Description: ADMISSION 08/25/2021 This is a 67 year old man who initially presented to his primary care provider in September 2022 with pain in his left foot. He was sent for an x-ray and while the x-ray was being performed, the tech pointed out a wound on his foot that the patient was not aware existed. He does have type 2 diabetes with significant neuropathy. His diabetes is suboptimally controlled with his most recent A1c being 8.5. He also has a history of coronary artery disease status post three- vessel CABG. he was initially seen by orthopedics, but they referred him to [facility]. He has undergone at least 7 operations/debridements and several applications of  skin substitute under the care of podiatry. He has been in a wound VAC for much of this time. His most recent procedure was July 28, 2021. A portion of the talus was biopsied and was found to be consistent with osteomyelitis. Culture also returned positive for corynebacterium. He was seen on August 16, 2021 by infectious disease. A PICC line has been placed and he will be receiving  a 6-week course of IV daptomycin and cefepime. In October 2022, he underwent lower extremity vascular studies. Results are copied here: Right: Resting right ankle-brachial index is within normal range. No evidence of significant right lower extremity arterial disease. The right toe-brachial index is abnormal. Left: Resting left ankle-brachial index indicates mild left lower extremity arterial disease. The left toe-brachial index is abnormal. He has not been seen by vascular surgery despite these findings. He presented to clinic today in a cam boot and is using a knee scooter to offload. Wound VAC was in place. Once this was removed, a large ulcer was identified on the left midfoot/ankle. Bone is frankly exposed. There is no malodorous or purulent drainage. There is some granulation tissue over the central portion of the exposed bone. There is a tunnel that extends posteriorly for roughly 10 cm. It has been discussed with him by multiple providers that he is at very high risk of losing his lower leg because of this wound. He is extremely eager to avoid this outcome and is here today to review his options as well as receive ongoing wound care. 09/03/2021: Here for reevaluation of his wound. There does not appear to have been any substantial improvement overall since our last visit. He has been in a wound VAC with white foam overlying the exposed bone. We are working on getting him approved for hyperbaric oxygen therapy. 09/10/2021: We are in the process of getting him cleared to begin hyperbaric oxygen therapy. He still  needs to obtain a chest x-ray. Although the wound measurements are roughly the same, I think the overall appearance of the wound is better. The exposed bone has a bit more granulation tissue covering it. He has not received a vascular surgery appointment to reevaluate his flow to the wound. 09/17/2021: He has been approved for hyperbaric oxygen therapy and completed his chest x-ray, which I reviewed and it appears normal. The tunnels at the 12 and 10:00 positions are smaller. There is more granulation tissue covering the exposed bone and the undermining has decreased. He still has not received a vascular surgery appointment. 09/24/2021: He initiated hyperbaric oxygen therapy this week and is tolerating it well. He has an appointment with vascular surgery coming up on May 16. The granulation tissue is covering more of the exposed bone and both tunnels are a bit smaller. 10/01/2021: He continues to tolerate hyperbaric oxygen therapy. He saw infectious disease and they are planning to pull his PICC line. He has been initiated on oral antibiotics (doxycycline and Augmentin). The wound looks about the same but the tunnels are a little bit smaller. The skin seems to be contracting somewhat around the exposed bone. 10/08/2021: The wound is still about the same size, but the tunnels continue to come in and the skin is contracting around the exposed bone. He continues to have some accumulation of necrotic material in the inferoposterior aspect of the wound as well as accumulation at the 12:00 tunnel area. 10/15/2021: The wound is smaller today. The tunnels continue to come in. There is less necrotic tissue present. He does have some periwound maceration. 10/22/2021: The wound is about the same size. There is a little bit less undermining at the distal portion. The exposed bone is dark and I am not sure if this is staining from silver nitrate or his VAC sponge or if it represents necrosis. The tunnels are shallower but  he does have some serous drainage coming from the 10:00 tunnel. He continues to tolerate hyperbaric  oxygen therapy well. 10/29/2021: The undermining continues to improve. The tunnels are about the same. He has good granulation tissue overlying the majority of the exposed bone. It does appear that perhaps the tubing from his wound VAC has been eroding the skin at the 12 clock position. He continues to accumulate senescent epithelium around the borders of the wound. 11/05/2021: The undermining is almost completely resolved. The tunnels have contracted fairly significantly. No significant slough or debris accumulation. There is still senescent epithelium accumulation around the borders of the wound. He has been tolerating hyperbaric oxygen therapy well. 11/12/2021: Despite the measurements of the wound being about the same, the wound has changed in its shape and overall, I think it is improved. The undermining has resolved and the tunnels continue to shorten. There is good granulation tissue encroaching over the small area of bone that has remained exposed at the 12 o'clock position. Minimal slough accumulation. He continues to tolerate hyperbaric oxygen therapy well. 11/19/2021: I took a PCR culture last week. There was overgrowth of yeast. He is already taking suppressive doxycycline and Augmentin. I added fluconazole to his regimen. The wound is smaller again today. The tunnels continue to shorten. He continues to do well with hyperbaric oxygen therapy. 11/26/2021: For some reason, his foot has become macerated. The wound is narrower but about the same dimensions in its longitudinal aspect. The tunnels continue to shorten. He has some slough buildup on the wound as well as some heaped up senescent epithelium around the perimeter. [name], Colin A ([id number]) [id number] Page 3 of 11 12/03/2021: No further maceration of his foot has occurred. The wound has contracted quite  significantly from last week. The tunnel at 10:00 is closed. The tunnel at 12:00 is down to just a couple of millimeters. No other undermining is present. There is soft tissue coverage of the previously exposed bone. There is just a bit of slough and biofilm on the wound surface. 12/10/2021: The wound is looking good. It turns out the tunnel at 12:00 is only exposed when the patient dorsiflexes his foot. It is about 2 cm in depth when he does this; when his foot is in plantarflexion, the tunnel is closed. The bone that was visible at the 12:00 tunnel is completely covered with granulation tissue, but there does feel like some exposed bone deeper into the tunnel area. There is senescent skin heaped up around the periphery. Minimal slough on the wound surface. 12/16/2021: The wound dimensions are roughly the same. The surface has nice granulation tissue. The exposed bone at the 12:00 tunnel continues to be covered with more soft tissue. 7/14; patient's wound measures smaller today. Using the wound VAC with underlying collagen. He is also being treated with HBO for underlying osteomyelitis. He tells me he is on doxycycline and ampicillin follows with infectious disease next week 12/31/2021: The wound continues to contract. Unfortunately, the area where the track pad and tubing have been rubbing continues to look like it is applying friction. He says that the home health nurses that have been applying the [facility] have been putting gauze underneath the tubing, but nonetheless there is ongoing tissue breakdown at this site. Light slough accumulation on the wound surface. The tunnel continues to contract. He is tolerating HBO without difficulty. 01/07/2022: Bridging the wound VAC away from the ankle has resulted in significant improvement in the tissue at the apex of the wound. The tunnel is still present and is not all that much shorter, but the overall wound surface  is very robust and healthy looking. Minimal  slough accumulation. No concern for acute infection. 01/21/2022: The wound continues to contract and has a robust granulation tissue surface. The tunnel has come in considerably and is down to about 1.4 cm. There is still bone exposed within the tunnel but the rest of it is well covered. There is some senescent epithelium at the wound margins and a little bit of slough on the surface. 01/28/2022: No significant change in the wound this week, but there has not been any reaccumulation of senescent epithelium or slough. The tunnel is perhaps a millimeter less in depth. He has been approved for Apligraf and we will apply this today. 02/04/2022: The wound has contracted somewhat and the tunnel has filled in completely. The wound surface is clean. He is here for Apligraf #2. 02/11/2022: The wound has contracted further and is now nearly flush with the surrounding skin surface. Light layer of slough. Apligraf #3 plan for today. 02/18/2022: There is a band of epithelium trying to cut across the superior portion of the wound. There is robust granulation tissue with just a light layer of slough and biofilm on the surface. There was a little bit of greenish drainage in the wound VAC but none appreciated on the site itself. He is here for Apligraf #4. 03/04/2022: The wound has contracted considerably. There is good granulation tissue on the surface. Minimal biofilm. He is here for Apligraf #5. 03/18/2022: The wound has epithelialized to the point that it has been divided into 2 areas. Both areas are smaller in total than at his previous visit. There is good granulation tissue present with just a little bit of periwound eschar and surface slough. 10/13; left medial foot. The patient has completed treatment with hyperbaric oxygen for underlying osteomyelitis he has had a nice response in the wound. Noted today that he still had some greenish drainage. Previously he has been treated with topical gentamicin but apparently  that was stopped 2 weeks ago. Fortunately the wound is measuring smaller 10/20; wound looks better no hypergranulation minimal drainage. Granulation tissue looks healthy using gentamicin Hydrofera Blue under compression 04/08/2022: The wound continues to contract tremendously. The response to his treatment for hypertrophic granulation tissue has been quite remarkable. There are just a couple of open areas remaining. 04/18/2022: His wound is healed. READMISSION 07/04/2022: He returns to clinic with a new ulcer adjacent to where his previous large wound had been. He says that he was wearing boots and using a small padded dressing to protect the freshly healed ankle wound. Apparently the dressing rolled up and rubbed on his skin, causing a new wound. He contacted our office last week concerned about the appearance. There is a small oval wound on his left medial ankle. It is fairly clean with some periwound dry skin and eschar. 07/11/2022: The wound is about the same size this week. There is a layer of rubbery slough on the surface. It has a fairly strong odor. 07/18/2022: The wound measured larger this week due to small satellite areas opening in a fan pattern distal to the primary wound. He continues to accumulate slough on the wound surface. The culture that I took last week only grew out low levels of skin flora. 07/25/2022: The satellite areas that had opened last week have closed. The main wound is smaller with some slough accumulation. 08/01/2022: The wound continues to contract. There is a layer of slough on the surface. 08/08/2022: The wound is a little bit smaller again  this week. Still with slough buildup. 3/18; patient presents for follow-up. He has been doing dressing changes on his own with silver alginate and antibiotic ointment. He has been at [employer] over the past 3 weeks. He has developed a new wound. 09/05/2022: I am not sure if the new wound that was reported last week is just the  satellite area that had closed a couple of weeks ago and reopened. Regardless, both wounds are quite small with good epithelialization. There is some eschar buildup around the periphery of both wounds with slough on both wound surfaces. 09/12/2022: The wounds have converged. There is some increased in depth at the distal portion. This seems to be secondary to moisture accumulation as the periwound skin is a bit macerated. 09/26/2022: The wound has deteriorated further. There is increased depth at the distal portion and the wound probes to bone. The patient theorizes that the boot he has been wearing is rubbing in this spot and upon inspection of the boot, there is actually a hard plastic boot in exactly this location, supporting his theory. Electronic Signature(s) Signed: 09/26/2022 3:27:14 PM By: [name] MD FACS Entered By: [name] on 09/26/2022 15:27:14 Colin Beech A ([id number]) [id number] Page 4 of 11 -------------------------------------------------------------------------------- Physical Exam Details Patient Name: Date of Service: NA Colin Nephew A. 09/26/2022 2:45 PM Medical Record Number: [id number] Patient Account Number: [id number] Date of Birth/Sex: Treating RN: [birth year] (67 y.o. M) Primary Care Provider: [name] Other Clinician: Referring Provider: Treating Provider/Extender: [name] Weeks in Treatment: 12 Constitutional Hypertensive, asymptomatic. . . . no acute distress. Respiratory Normal work of breathing on room air. Notes 09/26/2022: The wound has deteriorated further. There is increased depth at the distal portion and the wound probes to bone. Electronic Signature(s) Signed: 09/26/2022 3:30:10 PM By: [name] MD FACS Entered By: [name] on 09/26/2022 15:30:10 -------------------------------------------------------------------------------- Physician Orders Details Patient Name: Date  of Service: NA Colin Nephew A. 09/26/2022 2:45 PM Medical Record Number: [id number] Patient Account Number: [id number] Date of Birth/Sex: Treating RN: 1955-10-27 (67 y.o. [name] Primary Care Provider: [name] Other Clinician: Referring Provider: Treating Provider/Extender: [name] in Treatment: 12 Verbal / Phone Orders: No Diagnosis Coding ICD-10 Coding Code Description [phone] Non-pressure chronic ulcer of left ankle with fat layer exposed I73.9 Peripheral vascular disease, unspecified E11.622 Type 2 diabetes mellitus with other skin ulcer Follow-up Appointments ppointment in 1 week. - Dr. Lady Gary - room 2 Return A Anesthetic (In clinic) Topical Lidocaine 4% applied to wound bed Bathing/ Shower/ Hygiene May shower and wash wound with soap and water. Edema Control - Lymphedema / SCD / Other Elevate legs to the level of the heart or above for 30 minutes daily and/or when sitting for 3-4 times a day throughout the day. Avoid standing for long periods of time. Off-Loading Other: - knee scooter to left leg, stop wearing your orthoboot Wound Treatment Wound #2 - Ankle Wound Laterality: Left, Medial Cleanser: Normal Saline Every Other Day/30 Days Discharge Instructions: Cleanse the wound with Normal Saline prior to applying a clean dressing using gauze sponges, not tissue or cotton balls. Peri-Wound Care: Zinc Oxide Ointment 30g tube Every Other Day/30 Days Discharge Instructions: Apply Zinc Oxide to periwound with each dressing change Peri-Wound Care: Sween Lotion (Moisturizing lotion) Every Other Day/30 Days Discharge Instructions: Apply moisturizing lotion as directed Topical: Mupirocin Ointment Every Other Day/30 Days Discharge Instructions: Apply Mupirocin (Bactroban) as instructed [name], Colin A ([id number]) [phone].pdf  Page 5 of 11 Prim Dressing: Maxorb Extra Ag+ Alginate Dressing, 2x2 (in/in) (Generic)  Every Other Day/30 Days ary Discharge Instructions: Apply to wound bed as instructed Secondary Dressing: Optifoam Non-Adhesive Dressing, 4x4 in (Generic) Every Other Day/30 Days Discharge Instructions: Apply over primary dressing as directed. Secondary Dressing: Zetuvit Plus 4x8 in (Generic) Every Other Day/30 Days Discharge Instructions: Apply over primary dressing as directed. Secured With: Elastic Bandage 4 inch (ACE bandage) (Generic) Every Other Day/30 Days Discharge Instructions: Secure with ACE bandage as directed. Secured With: [employer], 4.5x3.1 (in/yd) (Generic) Every Other Day/30 Days Discharge Instructions: Secure with Kerlix as directed. Secured With: 28M Medipore H Soft Cloth Surgical T ape, 4 x 10 (in/yd) (Generic) Every Other Day/30 Days Discharge Instructions: Secure with tape as directed. Patient Medications llergies: codeine A Notifications Medication Indication Start End 09/26/2022 lidocaine DOSE topical 4 % cream - cream topical Electronic Signature(s) Signed: 09/26/2022 4:00:06 PM By: [name] MD FACS Previous Signature: 09/26/2022 3:09:24 PM Version By: Samuella Bruin Entered By: [name] on 09/26/2022 15:30:24 -------------------------------------------------------------------------------- Problem List Details Patient Name: Date of Service: NA Colin Nephew A. 09/26/2022 2:45 PM Medical Record Number: [id number] Patient Account Number: [id number] Date of Birth/Sex: Treating RN: [birth year] (67 y.o. M) Primary Care Provider: [name] Other Clinician: Referring Provider: Treating Provider/Extender: [name] Weeks in Treatment: 12 Active Problems ICD-10 Encounter Code Description Active Date MDM Diagnosis L97.322 Non-pressure chronic ulcer of left ankle with fat layer exposed 07/04/2022 No Yes I73.9 Peripheral vascular disease, unspecified 07/04/2022 No Yes E11.622 Type 2 diabetes mellitus with other skin ulcer  07/04/2022 No Yes Inactive Problems Resolved Problems Electronic Signature(s) Signed: 09/26/2022 3:14:23 PM By: [name] MD FACS Entered By: [name] on 09/26/2022 15:14:23 Colin Beech A ([id number]) [id number] Page 6 of 11 -------------------------------------------------------------------------------- Progress Note Details Patient Name: Date of Service: NA Colin Nephew A. 09/26/2022 2:45 PM Medical Record Number: [id number] Patient Account Number: [id number] Date of Birth/Sex: Treating RN: [birth year] (67 y.o. M) Primary Care Provider: [name] Other Clinician: Referring Provider: Treating Provider/Extender: [name] in Treatment: 12 Subjective Chief Complaint Information obtained from Patient Patients presents for treatment of an open diabetic ulcer with exposed bone and osteomyelitis 07/04/2022: Diabetic ankle ulcer History of Present Illness (HPI) ADMISSION 08/25/2021 This is a 67 year old man who initially presented to his primary care provider in September 2022 with pain in his left foot. He was sent for an x-ray and while the x-ray was being performed, the tech pointed out a wound on his foot that the patient was not aware existed. He does have type 2 diabetes with significant neuropathy. His diabetes is suboptimally controlled with his most recent A1c being 8.5. He also has a history of coronary artery disease status post three- vessel CABG. he was initially seen by orthopedics, but they referred him to [facility]. He has undergone at least 7 operations/debridements and several applications of skin substitute under the care of podiatry. He has been in a wound VAC for much of this time. His most recent procedure was July 28, 2021. A portion of the talus was biopsied and was found to be consistent with osteomyelitis. Culture also returned positive for corynebacterium. He was seen on August 16, 2021 by infectious disease. A PICC line has been placed and he will be receiving a 6-week course of IV daptomycin and cefepime. In October 2022, he underwent lower extremity vascular studies. Results are copied here:  Right: Resting right ankle-brachial index is within normal range. No evidence of significant right lower extremity arterial disease. The right toe-brachial index is abnormal. Left: Resting left ankle-brachial index indicates mild left lower extremity arterial disease. The left toe-brachial index is abnormal. He has not been seen by vascular surgery despite these findings. He presented to clinic today in a cam boot and is using a knee scooter to offload. Wound VAC was in place. Once this was removed, a large ulcer was identified on the left midfoot/ankle. Bone is frankly exposed. There is no malodorous or purulent drainage. There is some granulation tissue over the central portion of the exposed bone. There is a tunnel that extends posteriorly for roughly 10 cm. It has been discussed with him by multiple providers that he is at very high risk of losing his lower leg because of this wound. He is extremely eager to avoid this outcome and is here today to review his options as well as receive ongoing wound care. 09/03/2021: Here for reevaluation of his wound. There does not appear to have been any substantial improvement overall since our last visit. He has been in a wound VAC with white foam overlying the exposed bone. We are working on getting him approved for hyperbaric oxygen therapy. 09/10/2021: We are in the process of getting him cleared to begin hyperbaric oxygen therapy. He still needs to obtain a chest x-ray. Although the wound measurements are roughly the same, I think the overall appearance of the wound is better. The exposed bone has a bit more granulation tissue covering it. He has not received a vascular surgery appointment to reevaluate his flow to the wound. 09/17/2021: He  has been approved for hyperbaric oxygen therapy and completed his chest x-ray, which I reviewed and it appears normal. The tunnels at the 12 and 10:00 positions are smaller. There is more granulation tissue covering the exposed bone and the undermining has decreased. He still has not received a vascular surgery appointment. 09/24/2021: He initiated hyperbaric oxygen therapy this week and is tolerating it well. He has an appointment with vascular surgery coming up on May 16. The granulation tissue is covering more of the exposed bone and both tunnels are a bit smaller. 10/01/2021: He continues to tolerate hyperbaric oxygen therapy. He saw infectious disease and they are planning to pull his PICC line. He has been initiated on oral antibiotics (doxycycline and Augmentin). The wound looks about the same but the tunnels are a little bit smaller. The skin seems to be contracting somewhat around the exposed bone. 10/08/2021: The wound is still about the same size, but the tunnels continue to come in and the skin is contracting around the exposed bone. He continues to have some accumulation of necrotic material in the inferoposterior aspect of the wound as well as accumulation at the 12:00 tunnel area. 10/15/2021: The wound is smaller today. The tunnels continue to come in. There is less necrotic tissue present. He does have some periwound maceration. 10/22/2021: The wound is about the same size. There is a little bit less undermining at the distal portion. The exposed bone is dark and I am not sure if this is staining from silver nitrate or his VAC sponge or if it represents necrosis. The tunnels are shallower but he does have some serous drainage coming from the 10:00 tunnel. He continues to tolerate hyperbaric oxygen therapy well. 10/29/2021: The undermining continues to improve. The tunnels are about the same. He has good granulation tissue overlying the  majority of the exposed bone. It does appear that perhaps  the tubing from his wound VAC has been eroding the skin at the 12 clock position. He continues to accumulate senescent epithelium around the borders of the wound. 11/05/2021: The undermining is almost completely resolved. The tunnels have contracted fairly significantly. No significant slough or debris accumulation. There is still senescent epithelium accumulation around the borders of the wound. He has been tolerating hyperbaric oxygen therapy well. 11/12/2021: Despite the measurements of the wound being about the same, the wound has changed in its shape and overall, I think it is improved. The undermining has resolved and the tunnels continue to shorten. There is good granulation tissue encroaching over the small area of bone that has remained exposed at the 12 o'clock position. Minimal slough accumulation. He continues to tolerate hyperbaric oxygen therapy well. 11/19/2021: I took a PCR culture last week. There was overgrowth of yeast. He is already taking suppressive doxycycline and Augmentin. I added fluconazole to his regimen. The wound is smaller again today. The tunnels continue to shorten. He continues to do well with hyperbaric oxygen therapy. 11/26/2021: For some reason, his foot has become macerated. The wound is narrower but about the same dimensions in its longitudinal aspect. The tunnels continue to shorten. He has some slough buildup on the wound as well as some heaped up senescent epithelium around the perimeter. [name], Colin A ([id number]) [id number] Page 7 of 11 12/03/2021: No further maceration of his foot has occurred. The wound has contracted quite significantly from last week. The tunnel at 10:00 is closed. The tunnel at 12:00 is down to just a couple of millimeters. No other undermining is present. There is soft tissue coverage of the previously exposed bone. There is just a bit of slough and biofilm on the wound surface. 12/10/2021: The wound is looking  good. It turns out the tunnel at 12:00 is only exposed when the patient dorsiflexes his foot. It is about 2 cm in depth when he does this; when his foot is in plantarflexion, the tunnel is closed. The bone that was visible at the 12:00 tunnel is completely covered with granulation tissue, but there does feel like some exposed bone deeper into the tunnel area. There is senescent skin heaped up around the periphery. Minimal slough on the wound surface. 12/16/2021: The wound dimensions are roughly the same. The surface has nice granulation tissue. The exposed bone at the 12:00 tunnel continues to be covered with more soft tissue. 7/14; patient's wound measures smaller today. Using the wound VAC with underlying collagen. He is also being treated with HBO for underlying osteomyelitis. He tells me he is on doxycycline and ampicillin follows with infectious disease next week 12/31/2021: The wound continues to contract. Unfortunately, the area where the track pad and tubing have been rubbing continues to look like it is applying friction. He says that the home health nurses that have been applying the [facility] have been putting gauze underneath the tubing, but nonetheless there is ongoing tissue breakdown at this site. Light slough accumulation on the wound surface. The tunnel continues to contract. He is tolerating HBO without difficulty. 01/07/2022: Bridging the wound VAC away from the ankle has resulted in significant improvement in the tissue at the apex of the wound. The tunnel is still present and is not all that much shorter, but the overall wound surface is very robust and healthy looking. Minimal slough accumulation. No concern for acute infection. 01/21/2022: The wound continues to contract and  has a robust granulation tissue surface. The tunnel has come in considerably and is down to about 1.4 cm. There is still bone exposed within the tunnel but the rest of it is well covered. There is some senescent  epithelium at the wound margins and a little bit of slough on the surface. 01/28/2022: No significant change in the wound this week, but there has not been any reaccumulation of senescent epithelium or slough. The tunnel is perhaps a millimeter less in depth. He has been approved for Apligraf and we will apply this today. 02/04/2022: The wound has contracted somewhat and the tunnel has filled in completely. The wound surface is clean. He is here for Apligraf #2. 02/11/2022: The wound has contracted further and is now nearly flush with the surrounding skin surface. Light layer of slough. Apligraf #3 plan for today. 02/18/2022: There is a band of epithelium trying to cut across the superior portion of the wound. There is robust granulation tissue with just a light layer of slough and biofilm on the surface. There was a little bit of greenish drainage in the wound VAC but none appreciated on the site itself. He is here for Apligraf #4. 03/04/2022: The wound has contracted considerably. There is good granulation tissue on the surface. Minimal biofilm. He is here for Apligraf #5. 03/18/2022: The wound has epithelialized to the point that it has been divided into 2 areas. Both areas are smaller in total than at his previous visit. There is good granulation tissue present with just a little bit of periwound eschar and surface slough. 10/13; left medial foot. The patient has completed treatment with hyperbaric oxygen for underlying osteomyelitis he has had a nice response in the wound. Noted today that he still had some greenish drainage. Previously he has been treated with topical gentamicin but apparently that was stopped 2 weeks ago. Fortunately the wound is measuring smaller 10/20; wound looks better no hypergranulation minimal drainage. Granulation tissue looks healthy using gentamicin Hydrofera Blue under compression 04/08/2022: The wound continues to contract tremendously. The response to his treatment for  hypertrophic granulation tissue has been quite remarkable. There are just a couple of open areas remaining. 04/18/2022: His wound is healed. READMISSION 07/04/2022: He returns to clinic with a new ulcer adjacent to where his previous large wound had been. He says that he was wearing boots and using a small padded dressing to protect the freshly healed ankle wound. Apparently the dressing rolled up and rubbed on his skin, causing a new wound. He contacted our office last week concerned about the appearance. There is a small oval wound on his left medial ankle. It is fairly clean with some periwound dry skin and eschar. 07/11/2022: The wound is about the same size this week. There is a layer of rubbery slough on the surface. It has a fairly strong odor. 07/18/2022: The wound measured larger this week due to small satellite areas opening in a fan pattern distal to the primary wound. He continues to accumulate slough on the wound surface. The culture that I took last week only grew out low levels of skin flora. 07/25/2022: The satellite areas that had opened last week have closed. The main wound is smaller with some slough accumulation. 08/01/2022: The wound continues to contract. There is a layer of slough on the surface. 08/08/2022: The wound is a little bit smaller again this week. Still with slough buildup. 3/18; patient presents for follow-up. He has been doing dressing changes on his own with  silver alginate and antibiotic ointment. He has been at [employer] over the past 3 weeks. He has developed a new wound. 09/05/2022: I am not sure if the new wound that was reported last week is just the satellite area that had closed a couple of weeks ago and reopened. Regardless, both wounds are quite small with good epithelialization. There is some eschar buildup around the periphery of both wounds with slough on both wound surfaces. 09/12/2022: The wounds have converged. There is some increased in depth at the  distal portion. This seems to be secondary to moisture accumulation as the periwound skin is a bit macerated. 09/26/2022: The wound has deteriorated further. There is increased depth at the distal portion and the wound probes to bone. The patient theorizes that the boot he has been wearing is rubbing in this spot and upon inspection of the boot, there is actually a hard plastic boot in exactly this location, supporting his theory. Patient History Information obtained from Patient. Family History Cancer - Father, Diabetes - Father,Mother,Paternal Grandparents, Heart Disease - Father, Hypertension - Father, No family history of Hereditary Spherocytosis, Kidney Disease, Lung Disease, Seizures, Stroke, Thyroid Problems, Tuberculosis. [name], Colin A ([id number]) [id number] Page 8 of 11 Social History Never smoker, Marital Status - Married, Alcohol Use - Rarely, Drug Use - No History, Caffeine Use - Daily. Medical History Eyes Patient has history of Cataracts - Removed 2008 Cardiovascular Patient has history of Coronary Artery Disease, Hypertension, Myocardial Infarction, Peripheral Arterial Disease Endocrine Patient has history of Type II Diabetes Musculoskeletal Patient has history of Osteomyelitis Neurologic Patient has history of Neuropathy Medical A Surgical History Notes nd Cardiovascular Hypercholesterolemia Abnormal EKG CABG X3 2019 Gastrointestinal GERD Musculoskeletal Diabetic foot ulcer Objective Constitutional Hypertensive, asymptomatic. no acute distress. Vitals Time Taken: 2:35 PM, Height: 74 in, Weight: 187 lbs, BMI: 24, Temperature: 98 F, Pulse: 82 bpm, Respiratory Rate: 18 breaths/min, Blood Pressure: 160/77 mmHg. Respiratory Normal work of breathing on room air. General Notes: 09/26/2022: The wound has deteriorated further. There is increased depth at the distal portion and the wound probes to bone. Integumentary (Hair, Skin) Wound #2  status is Open. Original cause of wound was Pressure Injury. The date acquired was: 06/13/2022. The wound has been in treatment 12 weeks. The wound is located on the Left,Medial Ankle. The wound measures 4.1cm length x 1.6cm width x 0.3cm depth; 5.152cm^2 area and 1.546cm^3 volume. There is bone and Fat Layer (Subcutaneous Tissue) exposed. There is undermining starting at 5:00 and ending at 7:00 with a maximum distance of 1.3cm. There is a medium amount of serous drainage noted. The wound margin is distinct with the outline attached to the wound base. There is large (67-100%) pink, pale granulation within the wound bed. There is a small (1-33%) amount of necrotic tissue within the wound bed including Adherent Slough. The periwound skin appearance had no abnormalities noted for color. The periwound skin appearance exhibited: Callus, Dry/Scaly. The periwound skin appearance did not exhibit: Maceration. Periwound temperature was noted as No Abnormality. Assessment Active Problems ICD-10 Non-pressure chronic ulcer of left ankle with fat layer exposed Peripheral vascular disease, unspecified Type 2 diabetes mellitus with other skin ulcer Procedures Wound #2 Pre-procedure diagnosis of Wound #2 is a Diabetic Wound/Ulcer of the Lower Extremity located on the Left,Medial Ankle .Severity of Tissue Pre Debridement is: Fat layer exposed. There was a Selective/Open Wound Non-Viable Tissue Debridement with a total area of 6.56 sq cm performed by [name], MD. With the following instrument(s): Curette  to remove Non-Viable tissue/material. Material removed includes [facility] after achieving pain control using Lidocaine 4% T opical Solution. No specimens were taken. A time out was conducted at 14:48, prior to the start of the procedure. A Minimum amount of bleeding was controlled with Pressure. The procedure was tolerated well. Post Debridement Measurements: 4.1cm length x 1.6cm width x 0.3cm depth; 1.546cm^3  volume. Character of Wound/Ulcer Post Debridement is improved. Severity of Tissue Post Debridement is: Fat layer exposed. Post procedure Diagnosis Wound #2: Same as Pre-Procedure General Notes: scribed for Dr. Lady Gary by Samuella Bruin, RN. [name], Colin A ([id number]) [id number] Page 9 of 11 Plan Follow-up Appointments: Return Appointment in 1 week. - Dr. Lady Gary - room 2 Anesthetic: (In clinic) Topical Lidocaine 4% applied to wound bed Bathing/ Shower/ Hygiene: May shower and wash wound with soap and water. Edema Control - Lymphedema / SCD / Other: Elevate legs to the level of the heart or above for 30 minutes daily and/or when sitting for 3-4 times a day throughout the day. Avoid standing for long periods of time. Off-Loading: Other: - knee scooter to left leg, stop wearing your orthoboot The following medication(s) was prescribed: lidocaine topical 4 % cream cream topical was prescribed at facility WOUND #2: - Ankle Wound Laterality: Left, Medial Cleanser: Normal Saline Every Other Day/30 Days Discharge Instructions: Cleanse the wound with Normal Saline prior to applying a clean dressing using gauze sponges, not tissue or cotton balls. Peri-Wound Care: Zinc Oxide Ointment 30g tube Every Other Day/30 Days Discharge Instructions: Apply Zinc Oxide to periwound with each dressing change Peri-Wound Care: Sween Lotion (Moisturizing lotion) Every Other Day/30 Days Discharge Instructions: Apply moisturizing lotion as directed Topical: Mupirocin Ointment Every Other Day/30 Days Discharge Instructions: Apply Mupirocin (Bactroban) as instructed Prim Dressing: Maxorb Extra Ag+ Alginate Dressing, 2x2 (in/in) (Generic) Every Other Day/30 Days ary Discharge Instructions: Apply to wound bed as instructed Secondary Dressing: Optifoam Non-Adhesive Dressing, 4x4 in (Generic) Every Other Day/30 Days Discharge Instructions: Apply over primary dressing as  directed. Secondary Dressing: Zetuvit Plus 4x8 in (Generic) Every Other Day/30 Days Discharge Instructions: Apply over primary dressing as directed. Secured With: Elastic Bandage 4 inch (ACE bandage) (Generic) Every Other Day/30 Days Discharge Instructions: Secure with ACE bandage as directed. Secured With: [employer], 4.5x3.1 (in/yd) (Generic) Every Other Day/30 Days Discharge Instructions: Secure with Kerlix as directed. Secured With: 68M Medipore H Soft Cloth Surgical T ape, 4 x 10 (in/yd) (Generic) Every Other Day/30 Days Discharge Instructions: Secure with tape as directed. 09/26/2022: The wound has deteriorated further. There is increased depth at the distal portion and the wound probes to bone. I used a curette to debride slough from the wound. We are going to have to pack the deepest portion. We will continue topical mupirocin with silver alginate. I agree with the patient that the plastic loop from his boot is most likely the culprit causing the wound deterioration. I have asked him to stop wearing the boot and we will put him in a regular surgical sandal. Continue to use his knee scooter for mobility and offloading. Follow-up in 1 week. Electronic Signature(s) Signed: 09/26/2022 3:33:19 PM By: [name] MD FACS Entered By: [name] on 09/26/2022 15:33:19 -------------------------------------------------------------------------------- HxROS Details Patient Name: Date of Service: NA [name], [name] 09/26/2022 2:45 PM Medical Record Number: [id number] Patient Account Number: [id number] Date of Birth/Sex: Treating RN: 23-Nov-1955 (67 y.o. M) Primary Care Provider: [name] Other Clinician: Referring Provider: Treating Provider/Extender: Alben Spittle, Janine Ores  in Treatment: 12 Information Obtained From Patient Eyes Medical History: Positive for: Cataracts - Removed 2008 Cardiovascular Medical History: Positive for: Coronary Artery Disease;  Hypertension; Myocardial Infarction; Peripheral Arterial Disease Past Medical History Notes: Hypercholesterolemia Abnormal EKG CABG X3 2019 TAMEEM, [place] A ([id number]) [phone].pdf Page 10 of 11 Gastrointestinal Medical History: Past Medical History Notes: GERD Endocrine Medical History: Positive for: Type II Diabetes Time with diabetes: 24 years Treated with: Insulin, Oral agents Blood sugar tested every day: Yes Tested : Musculoskeletal Medical History: Positive for: Osteomyelitis Past Medical History Notes: Diabetic foot ulcer Neurologic Medical History: Positive for: Neuropathy HBO Extended History Items Eyes: Cataracts Immunizations Pneumococcal Vaccine: Received Pneumococcal Vaccination: Yes Received Pneumococcal Vaccination On or After 60th Birthday: Yes Implantable Devices Yes Family and Social History Cancer: Yes - Father; Diabetes: Yes - Father,Mother,Paternal Grandparents; Heart Disease: Yes - Father; Hereditary Spherocytosis: No; Hypertension: Yes - Father; Kidney Disease: No; Lung Disease: No; Seizures: No; Stroke: No; Thyroid Problems: No; Tuberculosis: No; Never smoker; Marital Status - Married; Alcohol Use: Rarely; Drug Use: No History; Caffeine Use: Daily; Financial Concerns: No; Food, Clothing or Shelter Needs: No; Support System Lacking: No; Transportation Concerns: No Electronic Signature(s) Signed: 09/26/2022 4:00:06 PM By: [name] MD FACS Entered By: [name] on 09/26/2022 15:29:34 -------------------------------------------------------------------------------- SuperBill Details Patient Name: Date of Service: NA Colin [name] 09/26/2022 Medical Record Number: [id number] Patient Account Number: [id number] Date of Birth/Sex: Treating RN: Dec 09, 1955 (67 y.o. M) Primary Care Provider: [name] Other Clinician: Referring Provider: Treating Provider/Extender: [name] Weeks in  Treatment: 12 Diagnosis Coding ICD-10 Codes Code Description 5590522069 Non-pressure chronic ulcer of left ankle with fat layer exposed I73.9 Peripheral vascular disease, unspecified E11.622 Type 2 diabetes mellitus with other skin ulcer Facility Procedures : LEMUEL, KREINER Code: [id number] Harl A ([id number]) ICD-1 L9 Description: 97597 - DEBRIDE WOUND 1ST 20 SQ CM OR < ICD-10 Diagnosis Description [phone] Diagnosis Description 7.322 Non-pressure chronic ulcer of left ankle with fat layer exposed Modifier: [id number] Quantity: 1 .pdf Page 11 of 11 Physician Procedures : CPT4 Code Description Modifier [id number] - WC PHYS LEVEL 4 - EST PT 25 ICD-10 Diagnosis Description L97.322 Non-pressure chronic ulcer of left ankle with fat layer exposed E11.622 Type 2 diabetes mellitus with other skin ulcer I73.9 Peripheral  vascular disease, unspecified Quantity: 1 : [id number] 97597 - WC PHYS DEBR WO ANESTH 20 SQ CM ICD-10 Diagnosis Description L97.322 Non-pressure chronic ulcer of left ankle with fat layer exposed Quantity: 1 Electronic Signature(s) Signed: 09/26/2022 3:33:42 PM By: [name] MD FACS Entered By: [name] on 09/26/2022 15:33:41

## 2022-09-27 NOTE — Progress Notes (Signed)
[name], [name] [id number] Page 1 of 7 Visit Report for 09/26/2022 Arrival Information Details Patient Name: Date of Service: NA [name] 09/26/2022 2:45 PM Medical Record Number: [id number] Patient Account Number: [id number] Date of Birth/Sex: Treating RN: 1955/10/28 (67 y.o. [name] Primary Care Theodoro Koval: [name] Other Clinician: Referring Blessin Kanno: Treating Dejae Bernet/Extender: [name] in Treatment: 12 Visit Information History Since Last Visit Added or deleted any medications: No Patient Arrived: Knee Scooter Any new allergies or adverse reactions: No Arrival Time: 14:33 Had [name] fall or experienced change in No Accompanied By: self activities of daily living that may affect Transfer Assistance: None risk of falls: Patient Identification Verified: Yes Signs or symptoms of abuse/neglect since last visito No Secondary Verification Process Completed: Yes Hospitalized since last visit: No Patient Requires Transmission-Based Precautions: No Implantable device outside of the clinic excluding No Patient Has Alerts: No cellular tissue based products placed in the center since last visit: Has Dressing in Place as Prescribed: Yes Pain Present Now: No Electronic Signature(s) Signed: 09/26/2022 3:09:24 PM By: Samuella Bruin Entered By: Samuella Bruin on 09/26/2022 14:33:53 -------------------------------------------------------------------------------- Encounter Discharge Information Details Patient Name: Date of Service: NA [name]. 09/26/2022 2:45 PM Medical Record Number: [id number] Patient Account Number: [id number] Date of Birth/Sex: Treating RN: 1955-10-21 (67 y.o. [name] Primary Care Karissa Meenan: [name] Other Clinician: Referring Rylend Pietrzak: Treating [name]/Extender: [name] in Treatment: 12 Encounter Discharge Information Items Post  Procedure Vitals Discharge Condition: Stable Temperature (F): 98 Ambulatory Status: Knee Scooter Pulse (bpm): 82 Discharge Destination: Home Respiratory Rate (breaths/min): 18 Transportation: Private Auto Blood Pressure (mmHg): 160/77 Accompanied By: self Schedule Follow-up Appointment: Yes Clinical Summary of Care: Patient Declined Electronic Signature(s) Signed: 09/26/2022 3:09:24 PM By: Samuella Bruin Entered By: Samuella Bruin on 09/26/2022 15:08:51 -------------------------------------------------------------------------------- Lower Extremity Assessment Details Patient Name: Date of Service: [name]. 09/26/2022 2:45 PM Medical Record Number: [id number] Patient Account Number: [id number] Date of Birth/Sex: Treating RN: May 15, 1956 (67 y.o. [name] Primary Care Kitiara Hintze: [name] Other Clinician: Referring Shiloh Swopes: Treating Breken Nazari/Extender: [name]: 12 Edema Assessment Assessed: [Left: No] [Right: No] N[LeftJEROD, [name] [Right: [id number] Page 2 of 7] Edema: [Left: Ye] [Right: s] Calf Left: Right: Point of Measurement: From Medial Instep 36 cm Ankle Left: Right: Point of Measurement: From Medial Instep 24 cm Vascular Assessment Pulses: Dorsalis Pedis Palpable: [Left:Yes] Electronic Signature(s) Signed: 09/26/2022 3:09:24 PM By: Samuella Bruin Entered By: Samuella Bruin on 09/26/2022 14:37:11 -------------------------------------------------------------------------------- Multi Wound Chart Details Patient Name: Date of Service: [name]. 09/26/2022 2:45 PM Medical Record Number: [id number] Patient Account Number: [id number] Date of Birth/Sex: Treating RN: 12-16-1955 (67 y.o. M) Primary Care Dennie Vecchio: [name] Other Clinician: Referring Man Effertz: Treating Hiilei Gerst/Extender: [name]:  12 Vital Signs Height(in): 74 Pulse(bpm): 82 Weight(lbs): 187 Blood Pressure(mmHg): 160/77 Body Mass Index(BMI): 24 Temperature(F): 98 Respiratory Rate(breaths/min): 18 [2:Photos:] [N/[name]:N/[name]] Left, Medial Ankle N/[name]/[name] Wound Location: Pressure Injury N/[name]/[name] Wounding Event: Diabetic Wound/Ulcer of the Lower N/[name]/[name] Primary Etiology: Extremity Cataracts, Coronary Artery Disease, N/[name]/[name] Comorbid History: Hypertension, Myocardial Infarction, Peripheral Arterial Disease, Type II Diabetes, Osteomyelitis, Neuropathy 06/13/2022 N/[name]/[name] Date Acquired: 12 N/[name]/[name] Weeks of Treatment: Open N/[name]/[name] Wound Status: No N/[name]/[name] Wound Recurrence: 4.1x1.6x0.3 N/[name]/[name] Measurements L x W x D (cm) 5.152 N/[name]/[name] [name] (cm) : rea  1.546 N/[name]/[name] Volume (cm) : -152.30% N/[name]/[name] % Reduction in [name] rea: -657.80% N/[name]/[name] % Reduction in Volume: 5 Starting Position 1 (o'clock): 7 Ending Position 1 (o'clock): 1.3 Maximum Distance 1 (cm): Yes N/[name]/[name] Undermining: Grade 3 N/[name]/[name] Classification: Medium N/[name]/[name] Exudate [name] mount: Serous N/[name]/[name] Exudate Type: amber N/[name]/[name] Exudate ColorBRADIN, [name] ([id number]) [id number] Page 3 of 7 Distinct, outline attached N/[name]/[name] Wound Margin: Large (67-100%) N/[name]/[name] Granulation Amount: Pink, Pale N/[name]/[name] Granulation Quality: Small (1-33%) N/[name]/[name] Necrotic Amount: Fat Layer (Subcutaneous Tissue): Yes N/[name]/[name] Exposed Structures: Bone: Yes Fascia: No Tendon: No Muscle: No Joint: No Large (67-100%) N/[name]/[name] Epithelialization: Debridement - Selective/Open Wound N/[name]/[name] Debridement: Pre-procedure Verification/Time Out 14:48 N/[name]/[name] Taken: Lidocaine 4% Topical Solution N/[name]/[name] Pain Control: Slough N/[name]/[name] Tissue Debrided: Non-Viable Tissue N/[name]/[name] Level: 6.56 N/[name]/[name] Debridement [name] (sq cm): rea Curette N/[name]/[name] Instrument: Minimum N/[name]/[name] Bleeding: Pressure N/[name]/[name] Hemostasis [name] chieved: Procedure was  tolerated well N/[name]/[name] Debridement Treatment Response: 4.1x1.6x0.3 N/[name]/[name] Post Debridement Measurements L x W x D (cm) 1.546 N/[name]/[name] Post Debridement Volume: (cm) Callus: Yes N/[name]/[name] Periwound Skin Texture: Dry/Scaly: Yes N/[name]/[name] Periwound Skin Moisture: Maceration: No No Abnormalities Noted N/[name]/[name] Periwound Skin Color: No Abnormality N/[name]/[name] Temperature: Debridement N/[name]/[name] Procedures Performed: Treatment Notes Wound #2 (Ankle) Wound Laterality: Left, Medial Cleanser Normal Saline Discharge Instruction: Cleanse the wound with Normal Saline prior to applying [name] clean dressing using gauze sponges, not tissue or cotton balls. Peri-Wound Care Zinc Oxide Ointment 30g tube Discharge Instruction: Apply Zinc Oxide to periwound with each dressing change Sween Lotion (Moisturizing lotion) Discharge Instruction: Apply moisturizing lotion as directed Topical Mupirocin Ointment Discharge Instruction: Apply Mupirocin (Bactroban) as instructed Primary Dressing Maxorb Extra Ag+ Alginate Dressing, 2x2 (in/in) Discharge Instruction: Apply to wound bed as instructed Secondary Dressing Optifoam Non-Adhesive Dressing, 4x4 in Discharge Instruction: Apply over primary dressing as directed. Zetuvit Plus 4x8 in Discharge Instruction: Apply over primary dressing as directed. Secured With Elastic Bandage 4 inch (ACE bandage) Discharge Instruction: Secure with ACE bandage as directed. Kerlix Roll Sterile, 4.5x3.1 (in/yd) Discharge Instruction: Secure with Kerlix as directed. 78M Medipore H Soft Cloth Surgical [name] ape, 4 x 10 (in/yd) Discharge Instruction: Secure with tape as directed. Compression Wrap Compression Stockings Add-Ons Electronic Signature(s) Signed: 09/26/2022 3:23:22 PM By: [name] MD [name], Signed: 09/26/2022 3:23:22 PM By: [name] MD FACS [name] ([id number]) [id number] Page 4 of 7 Entered By: [name] on 09/26/2022  15:23:22 -------------------------------------------------------------------------------- Multi-Disciplinary Care Plan Details Patient Name: Date of Service: NA [name] 09/26/2022 2:45 PM Medical Record Number: [id number] Patient Account Number: [id number] Date of Birth/Sex: Treating RN: [birth year] (67 y.o. [name] Primary Care Kellina Dreese: [name] Other Clinician: Referring Claryce Friel: Treating [name]/Extender: [name]: 12 Active Inactive Pressure Nursing Diagnoses: Knowledge deficit related to causes and risk factors for pressure ulcer development Knowledge deficit related to management of pressures ulcers Goals: Patient will remain free of pressure ulcers Date Initiated: 07/04/2022 Target Resolution Date: 10/14/2022 Goal Status: Active Interventions: Assess: immobility, friction, shearing, incontinence upon admission and as needed Assess potential for pressure ulcer upon admission and as needed Treatment Activities: Patient referred for pressure reduction/relief devices : 07/04/2022 Notes: Wound/Skin Impairment Nursing Diagnoses: Knowledge deficit related to smoking impact on wound healing Goals: Ulcer/skin breakdown will have [name] volume reduction of 30% by week 4  Date Initiated: 07/04/2022 Target Resolution Date: 10/14/2022 Goal Status: Active Interventions: Assess ulceration(s) every visit Provide education on ulcer and skin care Notes: Electronic Signature(s) Signed: 09/26/2022 3:09:24 PM By: Samuella Bruin Entered By: Samuella Bruin on 09/26/2022 14:44:23 -------------------------------------------------------------------------------- Pain Assessment Details Patient Name: Date of Service: [name]. 09/26/2022 2:45 PM Medical Record Number: [id number] Patient Account Number: [id number] Date of Birth/Sex: Treating RN: 08-28-1955 (68 y.o. [name] Primary Care [place] Carmack: [name]  Other Clinician: Referring Cambre Matson: Treating Colin Tanton/Extender: [name]: 12 Active Problems Location of Pain Severity and Description of Pain Patient Has Paino No Site Locations Rate the pain. [name], [name] ([id number]) [id number] Page 5 of 7 Rate the pain. Current Pain Level: 0 Pain Management and Medication Current Pain Management: Electronic Signature(s) Signed: 09/26/2022 3:09:24 PM By: Samuella Bruin Entered By: Samuella Bruin on 09/26/2022 14:34:01 -------------------------------------------------------------------------------- Patient/Caregiver Education Details Patient Name: Date of Service: NA [name] 4/15/2024andnbsp2:45 PM Medical Record Number: [id number] Patient Account Number: [id number] Date of Birth/Gender: Treating RN: 06/15/1955 (67 y.o. [name] Primary Care Physician: [name] Other Clinician: Referring Physician: Treating Physician/Extender: [name] in Treatment: 12 Education Assessment Education Provided To: Patient Education Topics Provided Wound/Skin Impairment: Methods: Explain/Verbal Responses: Reinforcements needed, State content correctly Electronic Signature(s) Signed: 09/26/2022 3:09:24 PM By: Samuella Bruin Entered By: Samuella Bruin on 09/26/2022 14:44:42 -------------------------------------------------------------------------------- Wound Assessment Details Patient Name: Date of Service: [name]. 09/26/2022 2:45 PM Medical Record Number: [id number] Patient Account Number: [id number] Date of Birth/Sex: Treating RN: [birth year] (67 y.o. [name] Primary Care Domonik Levario: [name] Other Clinician: Referring Keyri Salberg: Treating Makaio Mach/Extender: [name]: 12 Wound Status Wound Number: 2 Primary Diabetic Wound/Ulcer of the Lower  Extremity Etiology: Wound Location: Left, Medial Ankle Wound Open Wounding Event: Pressure Injury Status: Date Acquired: 06/13/2022 Comorbid Cataracts, Coronary Artery Disease, Hypertension, Myocardial Weeks Of Treatment: 12 History: Infarction, Peripheral Arterial Disease, Type II Diabetes, Clustered Wound: No Osteomyelitis, Neuropathy [name], [name] ([id number]) [id number] Page 6 of 7 Photos Wound Measurements Length: (cm) 4.1 Width: (cm) 1.6 Depth: (cm) 0.3 Area: (cm) 5.152 Volume: (cm) 1.546 % Reduction in Area: -152.3% % Reduction in Volume: -657.8% Epithelialization: Large (67-100%) Undermining: Yes Starting Position (o'clock): 5 Ending Position (o'clock): 7 Maximum Distance: (cm) 1.3 Wound Description Classification: Grade 3 Wound Margin: Distinct, outline attached Exudate Amount: Medium Exudate Type: Serous Exudate Color: amber Foul Odor After Cleansing: No Slough/Fibrino Yes Wound Bed Granulation Amount: Large (67-100%) Exposed Structure Granulation Quality: Pink, Pale Fascia Exposed: No Necrotic Amount: Small (1-33%) Fat Layer (Subcutaneous Tissue) Exposed: Yes Necrotic Quality: Adherent Slough Tendon Exposed: No Muscle Exposed: No Joint Exposed: No Bone Exposed: Yes Periwound Skin Texture Texture Color No Abnormalities Noted: No No Abnormalities Noted: Yes Callus: Yes Temperature / Pain Temperature: No Abnormality Moisture No Abnormalities Noted: No Dry / Scaly: Yes Maceration: No Treatment Notes Wound #2 (Ankle) Wound Laterality: Left, Medial Cleanser Normal Saline Discharge Instruction: Cleanse the wound with Normal Saline prior to applying [name] clean dressing using gauze sponges, not tissue or cotton balls. Peri-Wound Care Zinc Oxide Ointment 30g tube Discharge Instruction: Apply Zinc Oxide to periwound with each dressing change Sween Lotion (Moisturizing lotion) Discharge Instruction: Apply moisturizing lotion as  directed Topical Mupirocin Ointment Discharge Instruction: Apply Mupirocin (Bactroban) as instructed Primary Dressing Maxorb Extra Ag+ Alginate Dressing, 2x2 (in/in) Discharge Instruction: Apply to wound bed as instructed Garvey, Yani [name] (  [id number]) [id number] Page 7 of 7 Secondary Dressing Optifoam Non-Adhesive Dressing, 4x4 in Discharge Instruction: Apply over primary dressing as directed. Zetuvit Plus 4x8 in Discharge Instruction: Apply over primary dressing as directed. Secured With Elastic Bandage 4 inch (ACE bandage) Discharge Instruction: Secure with ACE bandage as directed. Kerlix Roll Sterile, 4.5x3.1 (in/yd) Discharge Instruction: Secure with Kerlix as directed. 31M Medipore H Soft Cloth Surgical [name] ape, 4 x 10 (in/yd) Discharge Instruction: Secure with tape as directed. Compression Wrap Compression Stockings Add-Ons Electronic Signature(s) Signed: 09/26/2022 4:38:22 PM By: Karie Schwalbe RN Entered By: Karie Schwalbe on 09/26/2022 14:41:51 -------------------------------------------------------------------------------- Vitals Details Patient Name: Date of Service: NA Colin [name]. 09/26/2022 2:45 PM Medical Record Number: [id number] Patient Account Number: [id number] Date of Birth/Sex: Treating RN: 14-Dec-1955 (67 y.o. [name] Primary Care Orilla Templeman: [name] Other Clinician: Referring Zoiee Wimmer: Treating Susumu Hackler/Extender: [name]: 12 Vital Signs Time Taken: 14:35 Temperature (F): 98 Height (in): 74 Pulse (bpm): 82 Weight (lbs): 187 Respiratory Rate (breaths/min): 18 Body Mass Index (BMI): 24 Blood Pressure (mmHg): 160/77 Reference Range: 80 - 120 mg / dl Electronic Signature(s) Signed: 09/26/2022 3:09:24 PM By: Samuella Bruin Entered By: Samuella Bruin on 09/26/2022 14:36:04

## 2022-10-05 ENCOUNTER — Encounter (HOSPITAL_BASED_OUTPATIENT_CLINIC_OR_DEPARTMENT_OTHER): Payer: BC Managed Care – PPO | Admitting: General Surgery

## 2022-10-05 DIAGNOSIS — Z951 Presence of aortocoronary bypass graft: Secondary | ICD-10-CM | POA: Diagnosis not present

## 2022-10-05 DIAGNOSIS — E1151 Type 2 diabetes mellitus with diabetic peripheral angiopathy without gangrene: Secondary | ICD-10-CM | POA: Diagnosis not present

## 2022-10-05 DIAGNOSIS — I2510 Atherosclerotic heart disease of native coronary artery without angina pectoris: Secondary | ICD-10-CM | POA: Diagnosis not present

## 2022-10-05 DIAGNOSIS — I10 Essential (primary) hypertension: Secondary | ICD-10-CM | POA: Diagnosis not present

## 2022-10-05 DIAGNOSIS — I252 Old myocardial infarction: Secondary | ICD-10-CM | POA: Diagnosis not present

## 2022-10-05 DIAGNOSIS — E1140 Type 2 diabetes mellitus with diabetic neuropathy, unspecified: Secondary | ICD-10-CM | POA: Diagnosis not present

## 2022-10-05 DIAGNOSIS — L97322 Non-pressure chronic ulcer of left ankle with fat layer exposed: Secondary | ICD-10-CM | POA: Diagnosis not present

## 2022-10-05 DIAGNOSIS — E11622 Type 2 diabetes mellitus with other skin ulcer: Secondary | ICD-10-CM | POA: Diagnosis not present

## 2022-10-06 NOTE — Progress Notes (Signed)
Colin, [name] [id number] Page 1 of 7 Visit Report for 10/05/2022 Arrival Information Details Patient Name: Date of Service: NA [name] 10/05/2022 1:30 PM Medical Record Number: [id number] Patient Account Number: [id number] Date of Birth/Sex: Treating RN: 11/06/1955 (67 y.o. [name] Primary Care [place] Catarino: [name] Other Clinician: Referring [name]: Treating [name]/Extender: [name] in Treatment: 13 Visit Information History Since Last Visit Added or deleted any medications: No Patient Arrived: Knee Scooter Any new allergies or adverse reactions: No Arrival Time: 13:19 Had [name] fall or experienced change in No Accompanied By: self activities of daily living that may affect Transfer Assistance: None risk of falls: Patient Identification Verified: Yes Signs or symptoms of abuse/neglect since last visito No Secondary Verification Process Completed: Yes Hospitalized since last visit: No Patient Requires Transmission-Based Precautions: No Implantable device outside of the clinic excluding No Patient Has Alerts: No cellular tissue based products placed in the center since last visit: Has Dressing in Place as Prescribed: Yes Pain Present Now: No Electronic Signature(s) Signed: 10/05/2022 4:03:28 PM By: Samuella Bruin Entered By: Samuella Bruin on 10/05/2022 13:21:01 -------------------------------------------------------------------------------- Compression Therapy Details Patient Name: Date of Service: [name]. 10/05/2022 1:30 PM Medical Record Number: [id number] Patient Account Number: [id number] Date of Birth/Sex: Treating RN: 28-Feb-1956 (67 y.o. [name] Primary Care [name]: [name] Other Clinician: Referring [name]: Treating Ankita Newcomer/Extender: [name]: 13 Compression Therapy Performed for Wound Assessment:  Wound #2 Left,Medial Ankle Performed By: Clinician Samuella Bruin, RN Compression Type: Double Layer Post Procedure Diagnosis Same as Pre-procedure Electronic Signature(s) Signed: 10/05/2022 4:03:28 PM By: Samuella Bruin Entered By: Samuella Bruin on 10/05/2022 14:21:07 -------------------------------------------------------------------------------- Encounter Discharge Information Details Patient Name: Date of Service: NA [name]. 10/05/2022 1:30 PM Medical Record Number: [id number] Patient Account Number: [id number] Date of Birth/Sex: Treating RN: 01-23-1956 (67 y.o. [name] Primary Care [name]: [name] Other Clinician: Referring [name]: Treating [name]/Extender: [name] in Treatment: 13 Encounter Discharge Information Items Post Procedure Vitals Discharge Condition: Stable Temperature (F): 97.5 Ambulatory Status: Knee Scooter Pulse (bpm): 79 Discharge Destination: Home Respiratory Rate (breaths/min): 16 Transportation: Private Auto Blood Pressure (mmHg): 131/76 Accompanied By: self [name] ([id number]) [id number] Page 2 of 7 Schedule Follow-up Appointment: Yes Clinical Summary of Care: Patient Declined Electronic Signature(s) Signed: 10/05/2022 4:03:28 PM By: Samuella Bruin Entered By: Samuella Bruin on 10/05/2022 14:22:22 -------------------------------------------------------------------------------- Lower Extremity Assessment Details Patient Name: Date of Service: [name]. 10/05/2022 1:30 PM Medical Record Number: [id number] Patient Account Number: [id number] Date of Birth/Sex: Treating RN: [birth year] (67 y.o. [name] Primary Care Marwan Lipe: [name] Other Clinician: Referring Ayan Yankey: Treating Xyla Leisner/Extender: [name]: 13 Edema Assessment Assessed: [name]: No] [name]: No] Edema: [Left: Ye]  [Right: s] Calf Left: Right: Point of Measurement: From Medial Instep 37.4 cm Ankle Left: Right: Point of Measurement: From Medial Instep 24.5 cm Vascular Assessment Pulses: Dorsalis Pedis Palpable: [Left:Yes] Electronic Signature(s) Signed: 10/05/2022 4:03:28 PM By: Samuella Bruin Entered By: Samuella Bruin on 10/05/2022 13:21:57 -------------------------------------------------------------------------------- Multi Wound Chart Details Patient Name: Date of Service: NA [name]. 10/05/2022 1:30 PM Medical Record Number: [id number] Patient Account Number: [id number] Date of Birth/Sex: Treating RN: 1955/10/09 (67 y.o. M) Primary Care Soliana Kitko: [name] Other Clinician: Referring Fynn Adel: Treating Shekelia Boutin/Extender: [name]: 13 Vital Signs Height(in): 74 Pulse(bpm): 79  Weight(lbs): 187 Blood Pressure(mmHg): 131/76 Body Mass Index(BMI): 24 Temperature(F): 97.5 Respiratory Rate(breaths/min): 16 [2:Photos:] [N/[name]:N/[name]] Left, Medial Ankle N/[name]/[name] Wound Location: Pressure Injury N/[name]/[name] Wounding Event: Diabetic Wound/Ulcer of the Lower N/[name]/[name] Primary Etiology: Extremity Cataracts, Coronary Artery Disease, N/[name]/[name] Comorbid History: Hypertension, Myocardial Infarction, Peripheral Arterial Disease, Type II Diabetes, Osteomyelitis, Neuropathy 06/13/2022 N/[name]/[name] Date Acquired: 13 N/[name]/[name] Weeks of Treatment: Open N/[name]/[name] Wound Status: No N/[name]/[name] Wound Recurrence: 3.9x1.1x0.3 N/[name]/[name] Measurements L x W x D (cm) 3.369 N/[name]/[name] [name] (cm) : rea 1.011 N/[name]/[name] Volume (cm) : -65.00% N/[name]/[name] % Reduction in [name] rea: -395.60% N/[name]/[name] % Reduction in Volume: 5 Starting Position 1 (o'clock): 7 Ending Position 1 (o'clock): 1.2 Maximum Distance 1 (cm): Yes N/[name]/[name] Undermining: Grade 3 N/[name]/[name] Classification: Medium N/[name]/[name] Exudate [name] mount: Serous N/[name]/[name] Exudate Type: amber N/[name]/[name] Exudate Color: Distinct, outline  attached N/[name]/[name] Wound Margin: Large (67-100%) N/[name]/[name] Granulation [name] mount: Pink, Pale N/[name]/[name] Granulation Quality: Small (1-33%) N/[name]/[name] Necrotic [name] mount: Fat Layer (Subcutaneous Tissue): Yes N/[name]/[name] Exposed Structures: Bone: Yes Fascia: No Tendon: No Muscle: No Joint: No Large (67-100%) N/[name]/[name] Epithelialization: Debridement - Selective/Open Wound N/[name]/[name] Debridement: Pre-procedure Verification/Time Out 13:31 N/[name]/[name] Taken: Lidocaine 4% Topical Solution N/[name]/[name] Pain Control: Slough N/[name]/[name] Tissue Debrided: Non-Viable Tissue N/[name]/[name] Level: 3.37 N/[name]/[name] Debridement [name] (sq cm): rea Curette N/[name]/[name] Instrument: Minimum N/[name]/[name] Bleeding: Pressure N/[name]/[name] Hemostasis [name] chieved: Procedure was tolerated well N/[name]/[name] Debridement Treatment Response: 3.9x1.1x0.3 N/[name]/[name] Post Debridement Measurements L x W x D (cm) 1.011 N/[name]/[name] Post Debridement Volume: (cm) Callus: Yes N/[name]/[name] Periwound Skin Texture: Dry/Scaly: Yes N/[name]/[name] Periwound Skin Moisture: Maceration: No No Abnormalities Noted N/[name]/[name] Periwound Skin Color: No Abnormality N/[name]/[name] Temperature: Debridement N/[name]/[name] Procedures Performed: Treatment Notes Electronic Signature(s) Signed: 10/05/2022 1:38:18 PM By: [name] MD FACS Entered By: [name] on 10/05/2022 13:38:18 -------------------------------------------------------------------------------- Multi-Disciplinary Care Plan Details Patient Name: Date of Service: [name]. 10/05/2022 1:30 PM Medical Record Number: [id number] Patient Account Number: [id number] Date of Birth/Sex: Treating RN: [birth year] (67 y.o. [name] Primary Care Carvin Almas: [name] Other Clinician: Referring Asuncion Shibata: Treating Birl Lobello/Extender: [name] in Treatment: [address] [name], [name] ([id number]) [id number] Page 4 of 7 Pressure Nursing Diagnoses: Knowledge deficit related  to causes and risk factors for pressure ulcer development Knowledge deficit related to management of pressures ulcers Goals: Patient will remain free of pressure ulcers Date Initiated: 07/04/2022 Target Resolution Date: 10/14/2022 Goal Status: Active Interventions: Assess: immobility, friction, shearing, incontinence upon admission and as needed Assess potential for pressure ulcer upon admission and as needed Treatment Activities: Patient referred for pressure reduction/relief devices : 07/04/2022 Notes: Wound/Skin Impairment Nursing Diagnoses: Knowledge deficit related to smoking impact on wound healing Goals: Ulcer/skin breakdown will have [name] volume reduction of 30% by week 4 Date Initiated: 07/04/2022 Target Resolution Date: 10/14/2022 Goal Status: Active Interventions: Assess ulceration(s) every visit Provide education on ulcer and skin care Notes: Electronic Signature(s) Signed: 10/05/2022 4:03:28 PM By: Samuella Bruin Entered By: Samuella Bruin on 10/05/2022 14:21:18 -------------------------------------------------------------------------------- Pain Assessment Details Patient Name: Date of Service: [name]. 10/05/2022 1:30 PM Medical Record Number: [id number] Patient Account Number: [id number] Date of Birth/Sex: Treating RN: 08-24-1955 (67 y.o. [name] Primary Care Jashawn Floyd: [name] Other Clinician: Referring Tamaya Pun: Treating Jerae Izard/Extender:  [name]: 13 Active Problems Location of Pain Severity and Description of Pain Patient Has Paino No Site Locations Rate the pain. Current Pain Level: 0 Pain Management and Medication [name], [name] [phone].pdf Page 5 of 7 Current Pain Management: Electronic Signature(s) Signed: 10/05/2022 4:03:28 PM By: Gelene Mink By: Samuella Bruin on 10/05/2022  13:21:19 -------------------------------------------------------------------------------- Patient/Caregiver Education Details Patient Name: Date of Service: NA [name] 4/24/2024andnbsp1:30 PM Medical Record Number: [id number] Patient Account Number: [id number] Date of Birth/Gender: Treating RN: 13-Sep-1955 (67 y.o. [name] Primary Care Physician: [name] Other Clinician: Referring Physician: Treating Physician/Extender: [name] in Treatment: 13 Education Assessment Education Provided To: Patient Education Topics Provided Wound/Skin Impairment: Methods: Explain/Verbal Responses: Reinforcements needed, State content correctly Electronic Signature(s) Signed: 10/05/2022 4:03:28 PM By: Samuella Bruin Entered By: Samuella Bruin on 10/05/2022 14:21:33 -------------------------------------------------------------------------------- Wound Assessment Details Patient Name: Date of Service: [name]. 10/05/2022 1:30 PM Medical Record Number: [id number] Patient Account Number: [id number] Date of Birth/Sex: Treating RN: 1955/12/25 (67 y.o. [name] Primary Care Claudetta Sallie: [name] Other Clinician: Referring Mickelle Goupil: Treating Marthena Whitmyer/Extender: [name]: 13 Wound Status Wound Number: 2 Primary Diabetic Wound/Ulcer of the Lower Extremity Etiology: Wound Location: Left, Medial Ankle Wound Open Wounding Event: Pressure Injury Status: Date Acquired: 06/13/2022 Comorbid Cataracts, Coronary Artery Disease, Hypertension, Myocardial Weeks Of Treatment: 13 History: Infarction, Peripheral Arterial Disease, Type II Diabetes, Clustered Wound: No Osteomyelitis, Neuropathy Photos Wound Measurements Length: (cm) 3.9 [name], [name] ([id number]) Width: (cm) 1.1 Depth: (cm) 0.3 Area: (cm) 3.369 Volume: (cm) 1.011 % Reduction in Area: -65% [id number]  Page 6 of 7 % Reduction in Volume: -395.6% Epithelialization: Large (67-100%) Undermining: Yes Starting Position (o'clock): 5 Ending Position (o'clock): 7 Maximum Distance: (cm) 1.2 Wound Description Classification: Grade 3 Wound Margin: Distinct, outline attached Exudate Amount: Medium Exudate Type: Serous Exudate Color: amber Foul Odor After Cleansing: No Slough/Fibrino Yes Wound Bed Granulation Amount: Large (67-100%) Exposed Structure Granulation Quality: Pink, Pale Fascia Exposed: No Necrotic Amount: Small (1-33%) Fat Layer (Subcutaneous Tissue) Exposed: Yes Necrotic Quality: Adherent Slough Tendon Exposed: No Muscle Exposed: No Joint Exposed: No Bone Exposed: Yes Periwound Skin Texture Texture Color No Abnormalities Noted: No No Abnormalities Noted: Yes Callus: Yes Temperature / Pain Temperature: No Abnormality Moisture No Abnormalities Noted: No Dry / Scaly: Yes Maceration: No Treatment Notes Wound #2 (Ankle) Wound Laterality: Left, Medial Cleanser Normal Saline Discharge Instruction: Cleanse the wound with Normal Saline prior to applying [name] clean dressing using gauze sponges, not tissue or cotton balls. Peri-Wound Care Zinc Oxide Ointment 30g tube Discharge Instruction: Apply Zinc Oxide to periwound with each dressing change Sween Lotion (Moisturizing lotion) Discharge Instruction: Apply moisturizing lotion as directed Topical Mupirocin Ointment Discharge Instruction: Apply Mupirocin (Bactroban) as instructed Primary Dressing Maxorb Extra Ag+ Alginate Dressing, 2x2 (in/in) Discharge Instruction: Apply to wound bed as instructed Secondary Dressing Optifoam Non-Adhesive Dressing, 4x4 in Discharge Instruction: Apply over primary dressing as directed. Zetuvit Plus 4x8 in Discharge Instruction: Apply over primary dressing as directed. Secured With Transpore Surgical Tape, 2x10 (in/yd) Discharge Instruction: Secure dressing with tape as  directed. Compression Wrap Urgo K2 Lite, two layer compression system, regular Discharge Instruction: Apply Urgo K2 Lite as directed (alternative to 3 layer compression). Compression Stockings Add-Ons [name], [name] ([id number]) [id number] Page 7 of 7 Electronic Signature(s) Signed: 10/05/2022 4:03:28 PM By: Samuella Bruin Entered By: Samuella Bruin on 10/05/2022 13:27:18 --------------------------------------------------------------------------------  Vitals Details Patient Name: Date of Service: [name] 10/05/2022 1:30 PM Medical Record Number: [id number] Patient Account Number: [id number] Date of Birth/Sex: Treating RN: 1955/09/22 (67 y.o. [name] Primary Care Saydi Kobel: [name] Other Clinician: Referring Alexas Basulto: Treating Krist Rosenboom/Extender: [name]: 13 Vital Signs Time Taken: 13:21 Temperature (F): 97.5 Height (in): 74 Pulse (bpm): 79 Weight (lbs): 187 Respiratory Rate (breaths/min): 16 Body Mass Index (BMI): 24 Blood Pressure (mmHg): 131/76 Reference Range: 80 - 120 mg / dl Electronic Signature(s) Signed: 10/05/2022 4:03:28 PM By: Samuella Bruin Entered By: Samuella Bruin on 10/05/2022 13:21:13

## 2022-10-06 NOTE — Progress Notes (Signed)
[name], ELLWOOD [name] [id number] Page 1 of 11 Visit Report for 10/05/2022 Chief Complaint Document Details Patient Name: Date of Service: NA [name] 10/05/2022 1:30 PM Medical Record Number: [id number] Patient Account Number: [id number] Date of Birth/Sex: Treating RN: 1956-02-24 (67 y.o. M) Primary Care Provider: [name] Other Clinician: Referring Provider: Treating Provider/Extender: [name] in Treatment: 13 Information Obtained from: Patient Chief Complaint Patients presents for treatment of an open diabetic ulcer with exposed bone and osteomyelitis 07/04/2022: Diabetic ankle ulcer Electronic Signature(s) Signed: 10/05/2022 1:38:58 PM By: Colin Guess MD FACS Entered By: [name] on 10/05/2022 13:38:57 -------------------------------------------------------------------------------- Debridement Details Patient Name: Date of Service: NA Colin [name]. 10/05/2022 1:30 PM Medical Record Number: [id number] Patient Account Number: [id number] Date of Birth/Sex: Treating RN: 1955/08/18 (67 y.o. [name] Primary Care Provider: [name] Other Clinician: Referring Provider: Treating Provider/Extender: [name] in Treatment: 13 Debridement Performed for Assessment: Wound #2 Left,Medial Ankle Performed By: Physician Colin Guess, MD Debridement Type: Debridement Severity of Tissue Pre Debridement: Fat layer exposed Level of Consciousness (Pre-procedure): Awake and Alert Pre-procedure Verification/Time Out Yes - 13:31 Taken: Start Time: 13:31 Pain Control: Lidocaine 4% T opical Solution Percent of Wound Bed Debrided: 100% T Area Debrided (cm): otal 3.37 Tissue and other material debrided: Non-Viable, Slough, Slough Level: Non-Viable Tissue Debridement Description: Selective/Open Wound Instrument: Curette Bleeding: Minimum Hemostasis Achieved:  Pressure Response to Treatment: Procedure was tolerated well Level of Consciousness (Post- Awake and Alert procedure): Post Debridement Measurements of Total Wound Length: (cm) 3.9 Width: (cm) 1.1 Depth: (cm) 0.3 Volume: (cm) 1.011 Character of Wound/Ulcer Post Debridement: Improved Severity of Tissue Post Debridement: Fat layer exposed Post Procedure Diagnosis Same as Pre-procedure Notes scribed for Dr. Lady Gary by Samuella Bruin, RN Electronic Signature(s) Signed: 10/05/2022 2:34:18 PM By: Colin Guess MD FACS [name] [name] ([id number]) [id number] Page 2 of 11 Signed: 10/05/2022 4:03:28 PM By: Samuella Bruin Entered By: Samuella Bruin on 10/05/2022 13:34:46 -------------------------------------------------------------------------------- HPI Details Patient Name: Date of Service: NA [name] [name]. 10/05/2022 1:30 PM Medical Record Number: [id number] Patient Account Number: [id number] Date of Birth/Sex: Treating RN: [birth year] (67 y.o. M) Primary Care Provider: [name] Other Clinician: Referring Provider: Treating Provider/Extender: [name] in Treatment: 13 History of Present Illness HPI Description: ADMISSION 08/25/2021 This is [name] 67 year old man who initially presented to his primary care provider in September 2022 with pain in his left foot. He was sent for an x-ray and while the x-ray was being performed, the tech pointed out [name] wound on his foot that the patient was not aware existed. He does have type 2 diabetes with significant neuropathy. His diabetes is suboptimally controlled with his most recent A1c being 8.5. He also has [name] history of coronary artery disease status post three- vessel CABG. he was initially seen by orthopedics, but they referred him to [facility]. He has undergone at least 7 operations/debridements and several applications of skin substitute under the care of  podiatry. He has been in [name] for much of this time. His most recent procedure was July 28, 2021. [name] was biopsied and was found to be consistent with osteomyelitis. Culture also returned positive for corynebacterium. He was seen on August 16, 2021 by infectious disease. [name] PICC line has been placed and he will be receiving [name] 6-week course of IV daptomycin and cefepime. In October 2022, he  underwent lower extremity vascular studies. Results are copied here: Right: Resting right ankle-brachial index is within normal range. No evidence of significant right lower extremity arterial disease. The right toe-brachial index is abnormal. Left: Resting left ankle-brachial index indicates mild left lower extremity arterial disease. The left toe-brachial index is abnormal. He has not been seen by vascular surgery despite these findings. He presented to clinic today in [name] and is using [name] to offload. Wound VAC was in place. Once this was removed, [name] large ulcer was identified on the left midfoot/ankle. Bone is frankly exposed. There is no malodorous or purulent drainage. There is some granulation tissue over the central portion of the exposed bone. There is [name] tunnel that extends posteriorly for roughly 10 cm. It has been discussed with him by multiple providers that he is at very high risk of losing his lower leg because of this wound. He is extremely eager to avoid this outcome and is here today to review his options as well as receive ongoing wound care. 09/03/2021: Here for reevaluation of his wound. There does not appear to have been any substantial improvement overall since our last visit. He has been in [name] with white foam overlying the exposed bone. We are working on getting him approved for hyperbaric oxygen therapy. 09/10/2021: We are in the process of getting him cleared to begin hyperbaric oxygen therapy. He still needs to obtain [name] chest x-ray.  Although the wound measurements are roughly the same, I think the overall appearance of the wound is better. The exposed bone has [name] bit more granulation tissue covering it. He has not received [name] vascular surgery appointment to reevaluate his flow to the wound. 09/17/2021: He has been approved for hyperbaric oxygen therapy and completed his chest x-ray, which I reviewed and it appears normal. The tunnels at the 12 and 10:00 positions are smaller. There is more granulation tissue covering the exposed bone and the undermining has decreased. He still has not received [name] vascular surgery appointment. 09/24/2021: He initiated hyperbaric oxygen therapy this week and is tolerating it well. He has an appointment with vascular surgery coming up on May 16. The granulation tissue is covering more of the exposed bone and both tunnels are [name] bit smaller. 10/01/2021: He continues to tolerate hyperbaric oxygen therapy. He saw infectious disease and they are planning to pull his PICC line. He has been initiated on oral antibiotics (doxycycline and Augmentin). The wound looks about the same but the tunnels are [name] little bit smaller. The skin seems to be contracting somewhat around the exposed bone. 10/08/2021: The wound is still about the same size, but the tunnels continue to come in and the skin is contracting around the exposed bone. He continues to have some accumulation of necrotic material in the inferoposterior aspect of the wound as well as accumulation at the 12:00 tunnel area. 10/15/2021: The wound is smaller today. The tunnels continue to come in. There is less necrotic tissue present. He does have some periwound maceration. 10/22/2021: The wound is about the same size. There is [name] little bit less undermining at the distal portion. The exposed bone is dark and I am not sure if this is staining from silver nitrate or his VAC sponge or if it represents necrosis. The tunnels are shallower but he does have some serous  drainage coming from the 10:00 tunnel. He continues to tolerate hyperbaric oxygen therapy well. 10/29/2021: The undermining continues to improve. The tunnels are  about the same. He has good granulation tissue overlying the majority of the exposed bone. It does appear that perhaps the tubing from his wound VAC has been eroding the skin at the 12 clock position. He continues to accumulate senescent epithelium around the borders of the wound. 11/05/2021: The undermining is almost completely resolved. The tunnels have contracted fairly significantly. No significant slough or debris accumulation. There is still senescent epithelium accumulation around the borders of the wound. He has been tolerating hyperbaric oxygen therapy well. 11/12/2021: Despite the measurements of the wound being about the same, the wound has changed in its shape and overall, I think it is improved. The undermining has resolved and the tunnels continue to shorten. There is good granulation tissue encroaching over the small area of bone that has remained exposed at the 12 o'clock position. Minimal slough accumulation. He continues to tolerate hyperbaric oxygen therapy well. 11/19/2021: I took [name] PCR culture last week. There was overgrowth of yeast. He is already taking suppressive doxycycline and Augmentin. I added fluconazole to his regimen. The wound is smaller again today. The tunnels continue to shorten. He continues to do well with hyperbaric oxygen therapy. 11/26/2021: For some reason, his foot has become macerated. The wound is narrower but about the same dimensions in its longitudinal aspect. The tunnels KRISHAN, MCBREEN [name] [id number] Page 3 of 11 continue to shorten. He has some slough buildup on the wound as well as some heaped up senescent epithelium around the perimeter. 12/03/2021: No further maceration of his foot has occurred. The wound has contracted quite significantly from last week. The  tunnel at 10:00 is closed. The tunnel at 12:00 is down to just [name] couple of millimeters. No other undermining is present. There is soft tissue coverage of the previously exposed bone. There is just [name] bit of slough and biofilm on the wound surface. 12/10/2021: The wound is looking good. It turns out the tunnel at 12:00 is only exposed when the patient dorsiflexes his foot. It is about 2 cm in depth when he does this; when his foot is in plantarflexion, the tunnel is closed. The bone that was visible at the 12:00 tunnel is completely covered with granulation tissue, but there does feel like some exposed bone deeper into the tunnel area. There is senescent skin heaped up around the periphery. Minimal slough on the wound surface. 12/16/2021: The wound dimensions are roughly the same. The surface has nice granulation tissue. The exposed bone at the 12:00 tunnel continues to be covered with more soft tissue. 7/14; patient's wound measures smaller today. Using the wound VAC with underlying collagen. He is also being treated with HBO for underlying osteomyelitis. He tells me he is on doxycycline and ampicillin follows with infectious disease next week 12/31/2021: The wound continues to contract. Unfortunately, the area where the track pad and tubing have been rubbing continues to look like it is applying friction. He says that the home health nurses that have been applying the [facility] have been putting gauze underneath the tubing, but nonetheless there is ongoing tissue breakdown at this site. Light slough accumulation on the wound surface. The tunnel continues to contract. He is tolerating HBO without difficulty. 01/07/2022: Bridging the wound VAC away from the ankle has resulted in significant improvement in the tissue at the apex of the wound. The tunnel is still present and is not all that much shorter, but the overall wound surface is very robust and healthy looking. Minimal slough accumulation. No concern for  acute infection. 01/21/2022: The wound continues to contract and has [name] robust granulation tissue surface. The tunnel has come in considerably and is down to about 1.4 cm. There is still bone exposed within the tunnel but the rest of it is well covered. There is some senescent epithelium at the wound margins and [name] little bit of slough on the surface. 01/28/2022: No significant change in the wound this week, but there has not been any reaccumulation of senescent epithelium or slough. The tunnel is perhaps [name] millimeter less in depth. He has been approved for Apligraf and we will apply this today. 02/04/2022: The wound has contracted somewhat and the tunnel has filled in completely. The wound surface is clean. He is here for Apligraf #2. 02/11/2022: The wound has contracted further and is now nearly flush with the surrounding skin surface. Light layer of slough. Apligraf #3 plan for today. 02/18/2022: There is [name] band of epithelium trying to cut across the superior portion of the wound. There is robust granulation tissue with just [name] light layer of slough and biofilm on the surface. There was [name] little bit of greenish drainage in the wound VAC but none appreciated on the site itself. He is here for Apligraf #4. 03/04/2022: The wound has contracted considerably. There is good granulation tissue on the surface. Minimal biofilm. He is here for Apligraf #5. 03/18/2022: The wound has epithelialized to the point that it has been divided into 2 areas. Both areas are smaller in total than at his previous visit. There is good granulation tissue present with just [name] little bit of periwound eschar and surface slough. 10/13; left medial foot. The patient has completed treatment with hyperbaric oxygen for underlying osteomyelitis he has had [name] nice response in the wound. Noted today that he still had some greenish drainage. Previously he has been treated with topical gentamicin but apparently that was stopped 2 weeks  ago. Fortunately the wound is measuring smaller 10/20; wound looks better no hypergranulation minimal drainage. Granulation tissue looks healthy using gentamicin Hydrofera Blue under compression 04/08/2022: The wound continues to contract tremendously. The response to his treatment for hypertrophic granulation tissue has been quite remarkable. There are just [name] couple of open areas remaining. 04/18/2022: His wound is healed. READMISSION 07/04/2022: He returns to clinic with [name] new ulcer adjacent to where his previous large wound had been. He says that he was wearing boots and using [name] small padded dressing to protect the freshly healed ankle wound. Apparently the dressing rolled up and rubbed on his skin, causing [name] new wound. He contacted our office last week concerned about the appearance. There is [name] small oval wound on his left medial ankle. It is fairly clean with some periwound dry skin and eschar. 07/11/2022: The wound is about the same size this week. There is [name] layer of rubbery slough on the surface. It has [name] fairly strong odor. 07/18/2022: The wound measured larger this week due to small satellite areas opening in [name] fan pattern distal to the primary wound. He continues to accumulate slough on the wound surface. The culture that I took last week only grew out low levels of skin flora. 07/25/2022: The satellite areas that had opened last week have closed. The main wound is smaller with some slough accumulation. 08/01/2022: The wound continues to contract. There is [name] layer of slough on the surface. 08/08/2022: The wound is [name] little bit smaller again this week. Still with slough buildup. 3/18; patient presents for follow-up. He has  been doing dressing changes on his own with silver alginate and antibiotic ointment. He has been at [employer] over the past 3 weeks. He has developed [name] new wound. 09/05/2022: I am not sure if the new wound that was reported last week is just the satellite area that had  closed [name] couple of weeks ago and reopened. Regardless, both wounds are quite small with good epithelialization. There is some eschar buildup around the periphery of both wounds with slough on both wound surfaces. 09/12/2022: The wounds have converged. There is some increased in depth at the distal portion. This seems to be secondary to moisture accumulation as the periwound skin is [name] bit macerated. 09/26/2022: The wound has deteriorated further. There is increased depth at the distal portion and the wound probes to bone. The patient theorizes that the boot he has been wearing is rubbing in this spot and upon inspection of the boot, there is actually [name] hard plastic boot in exactly this location, supporting his theory. 10/05/2022: The wound looks [name] little bit better this week. The area that probed to bone now has some tissue coverage. There is still some slough buildup on the wound surfaces. Electronic Signature(s) Signed: 10/05/2022 1:39:44 PM By: Colin Guess MD FACS [name] ([id number]) [id number] Page 4 of 11 Entered By: [name] on 10/05/2022 13:39:44 -------------------------------------------------------------------------------- Physical Exam Details Patient Name: Date of Service: Colin Fanning [name]. 10/05/2022 1:30 PM Medical Record Number: [id number] Patient Account Number: [id number] Date of Birth/Sex: Treating RN: [birth year] (67 y.o. M) Primary Care Provider: [name] Other Clinician: Referring Provider: Treating Provider/Extender: [name]: 13 Constitutional . . . . no acute distress. Respiratory Normal work of breathing on room air. Notes 10/05/2022: The wound looks [name] little bit better this week. The area that probed to bone now has some tissue coverage. There is still some slough buildup on the wound surfaces. Electronic Signature(s) Signed: 10/05/2022 1:40:30 PM By: Colin Guess MD  FACS Entered By: [name] on 10/05/2022 13:40:30 -------------------------------------------------------------------------------- Physician Orders Details Patient Name: Date of Service: NA Colin [name]. 10/05/2022 1:30 PM Medical Record Number: [id number] Patient Account Number: [id number] Date of Birth/Sex: Treating RN: [birth year] (67 y.o. [name] Primary Care Provider: [name] Other Clinician: Referring Provider: Treating Provider/Extender: [name] in Treatment: 13 Verbal / Phone Orders: No Diagnosis Coding ICD-10 Coding Code Description [phone] Non-pressure chronic ulcer of left ankle with fat layer exposed I73.9 Peripheral vascular disease, unspecified E11.622 Type 2 diabetes mellitus with other skin ulcer Follow-up Appointments ppointment in 1 week. - Dr. Lady Gary - room 2 Return [name] Anesthetic (In clinic) Topical Lidocaine 4% applied to wound bed Bathing/ Shower/ Hygiene May shower with protection but do not get wound dressing(s) wet. Protect dressing(s) with water repellant cover (for example, large plastic bag) or [name] cast cover and may then take shower. Edema Control - Lymphedema / SCD / Other Elevate legs to the level of the heart or above for 30 minutes daily and/or when sitting for 3-4 times [name] day throughout the day. Avoid standing for long periods of time. Off-Loading Open toe surgical shoe to: - left foot Other: - knee scooter to left leg, stop wearing your orthoboot Wound Treatment Wound #2 - Ankle Wound Laterality: Left, Medial Cleanser: Normal Saline 1 x Per Week/30 Days Discharge Instructions: Cleanse the wound with Normal Saline prior to applying [name] clean dressing using gauze sponges, not tissue or cotton  balls. Peri-Wound Care: Zinc Oxide Ointment 30g tube 1 x Per Week/30 Days Discharge Instructions: Apply Zinc Oxide to periwound with each dressing change YEHIA, MCBAIN [name]  [id number] Page 5 of 11 Peri-Wound Care: Sween Lotion (Moisturizing lotion) 1 x Per Week/30 Days Discharge Instructions: Apply moisturizing lotion as directed Topical: Mupirocin Ointment 1 x Per Week/30 Days Discharge Instructions: Apply Mupirocin (Bactroban) as instructed Prim Dressing: Maxorb Extra Ag+ Alginate Dressing, 2x2 (in/in) (Generic) 1 x Per Week/30 Days ary Discharge Instructions: Apply to wound bed as instructed Secondary Dressing: Optifoam Non-Adhesive Dressing, 4x4 in (Generic) 1 x Per Week/30 Days Discharge Instructions: Apply over primary dressing as directed. Secondary Dressing: Zetuvit Plus 4x8 in (Generic) 1 x Per Week/30 Days Discharge Instructions: Apply over primary dressing as directed. Secured With: Transpore Surgical Tape, 2x10 (in/yd) 1 x Per Week/30 Days Discharge Instructions: Secure dressing with tape as directed. Compression Wrap: Urgo K2 Lite, two layer compression system, regular 1 x Per Week/30 Days Discharge Instructions: Apply Urgo K2 Lite as directed (alternative to 3 layer compression). Consults Vein and Vascular - referral for abnormal ABI 0.8, TBI 0.53, and nonhealing wound to left leg ICD10: E11.622 Electronic Signature(s) Signed: 10/05/2022 2:34:18 PM By: Colin Guess MD FACS Entered By: [name] on 10/05/2022 13:41:02 -------------------------------------------------------------------------------- Problem List Details Patient Name: Date of Service: NA Colin [name]. 10/05/2022 1:30 PM Medical Record Number: [id number] Patient Account Number: [id number] Date of Birth/Sex: Treating RN: [birth year] (67 y.o. M) Primary Care Provider: [name] Other Clinician: Referring Provider: Treating Provider/Extender: [name]: 13 Active Problems ICD-10 Encounter Code Description Active Date MDM Diagnosis L97.322 Non-pressure chronic ulcer of left ankle with fat layer  exposed 07/04/2022 No Yes I73.9 Peripheral vascular disease, unspecified 07/04/2022 No Yes E11.622 Type 2 diabetes mellitus with other skin ulcer 07/04/2022 No Yes Inactive Problems Resolved Problems Electronic Signature(s) Signed: 10/05/2022 1:38:10 PM By: Colin Guess MD FACS Entered By: [name] on 10/05/2022 13:38:10 [name] [name] ([id number]) [id number] Page 6 of 11 -------------------------------------------------------------------------------- Progress Note Details Patient Name: Date of Service: Colin Fanning [name]. 10/05/2022 1:30 PM Medical Record Number: [id number] Patient Account Number: [id number] Date of Birth/Sex: Treating RN: 1956/06/06 (67 y.o. M) Primary Care Provider: [name] Other Clinician: Referring Provider: Treating Provider/Extender: [name] in Treatment: 13 Subjective Chief Complaint Information obtained from Patient Patients presents for treatment of an open diabetic ulcer with exposed bone and osteomyelitis 07/04/2022: Diabetic ankle ulcer History of Present Illness (HPI) ADMISSION 08/25/2021 This is [name] 67 year old man who initially presented to his primary care provider in September 2022 with pain in his left foot. He was sent for an x-ray and while the x-ray was being performed, the tech pointed out [name] wound on his foot that the patient was not aware existed. He does have type 2 diabetes with significant neuropathy. His diabetes is suboptimally controlled with his most recent A1c being 8.5. He also has [name] history of coronary artery disease status post three- vessel CABG. he was initially seen by orthopedics, but they referred him to [facility]. He has undergone at least 7 operations/debridements and several applications of skin substitute under the care of podiatry. He has been in [name] for much of this time. His most recent procedure was July 28, 2021. [name] portion of  the talus was biopsied and was found to be consistent with osteomyelitis. Culture also returned positive for corynebacterium. He was seen on  August 16, 2021 by infectious disease. [name] PICC line has been placed and he will be receiving [name] 6-week course of IV daptomycin and cefepime. In October 2022, he underwent lower extremity vascular studies. Results are copied here: Right: Resting right ankle-brachial index is within normal range. No evidence of significant right lower extremity arterial disease. The right toe-brachial index is abnormal. Left: Resting left ankle-brachial index indicates mild left lower extremity arterial disease. The left toe-brachial index is abnormal. He has not been seen by vascular surgery despite these findings. He presented to clinic today in [name] and is using [name] to offload. Wound VAC was in place. Once this was removed, [name] large ulcer was identified on the left midfoot/ankle. Bone is frankly exposed. There is no malodorous or purulent drainage. There is some granulation tissue over the central portion of the exposed bone. There is [name] tunnel that extends posteriorly for roughly 10 cm. It has been discussed with him by multiple providers that he is at very high risk of losing his lower leg because of this wound. He is extremely eager to avoid this outcome and is here today to review his options as well as receive ongoing wound care. 09/03/2021: Here for reevaluation of his wound. There does not appear to have been any substantial improvement overall since our last visit. He has been in [name] with white foam overlying the exposed bone. We are working on getting him approved for hyperbaric oxygen therapy. 09/10/2021: We are in the process of getting him cleared to begin hyperbaric oxygen therapy. He still needs to obtain [name] chest x-ray. Although the wound measurements are roughly the same, I think the overall appearance of the wound is better. The exposed bone  has [name] bit more granulation tissue covering it. He has not received [name] vascular surgery appointment to reevaluate his flow to the wound. 09/17/2021: He has been approved for hyperbaric oxygen therapy and completed his chest x-ray, which I reviewed and it appears normal. The tunnels at the 12 and 10:00 positions are smaller. There is more granulation tissue covering the exposed bone and the undermining has decreased. He still has not received [name] vascular surgery appointment. 09/24/2021: He initiated hyperbaric oxygen therapy this week and is tolerating it well. He has an appointment with vascular surgery coming up on May 16. The granulation tissue is covering more of the exposed bone and both tunnels are [name] bit smaller. 10/01/2021: He continues to tolerate hyperbaric oxygen therapy. He saw infectious disease and they are planning to pull his PICC line. He has been initiated on oral antibiotics (doxycycline and Augmentin). The wound looks about the same but the tunnels are [name] little bit smaller. The skin seems to be contracting somewhat around the exposed bone. 10/08/2021: The wound is still about the same size, but the tunnels continue to come in and the skin is contracting around the exposed bone. He continues to have some accumulation of necrotic material in the inferoposterior aspect of the wound as well as accumulation at the 12:00 tunnel area. 10/15/2021: The wound is smaller today. The tunnels continue to come in. There is less necrotic tissue present. He does have some periwound maceration. 10/22/2021: The wound is about the same size. There is [name] little bit less undermining at the distal portion. The exposed bone is dark and I am not sure if this is staining from silver nitrate or his VAC sponge or if it represents necrosis. The tunnels are shallower but  he does have some serous drainage coming from the 10:00 tunnel. He continues to tolerate hyperbaric oxygen therapy well. 10/29/2021: The undermining  continues to improve. The tunnels are about the same. He has good granulation tissue overlying the majority of the exposed bone. It does appear that perhaps the tubing from his wound VAC has been eroding the skin at the 12 clock position. He continues to accumulate senescent epithelium around the borders of the wound. 11/05/2021: The undermining is almost completely resolved. The tunnels have contracted fairly significantly. No significant slough or debris accumulation. There is still senescent epithelium accumulation around the borders of the wound. He has been tolerating hyperbaric oxygen therapy well. 11/12/2021: Despite the measurements of the wound being about the same, the wound has changed in its shape and overall, I think it is improved. The undermining has resolved and the tunnels continue to shorten. There is good granulation tissue encroaching over the small area of bone that has remained exposed at the 12 o'clock position. Minimal slough accumulation. He continues to tolerate hyperbaric oxygen therapy well. 11/19/2021: I took [name] PCR culture last week. There was overgrowth of yeast. He is already taking suppressive doxycycline and Augmentin. I added fluconazole to his regimen. The wound is smaller again today. The tunnels continue to shorten. He continues to do well with hyperbaric oxygen therapy. 11/26/2021: For some reason, his foot has become macerated. The wound is narrower but about the same dimensions in its longitudinal aspect. The tunnels continue to shorten. He has some slough buildup on the wound as well as some heaped up senescent epithelium around the perimeter. 12/03/2021: No further maceration of his foot has occurred. The wound has contracted quite significantly from last week. The tunnel at 10:00 is closed. The OCIE, TINO [name] [id number] Page 7 of 11 tunnel at 12:00 is down to just [name] couple of millimeters. No other undermining is present.  There is soft tissue coverage of the previously exposed bone. There is just [name] bit of slough and biofilm on the wound surface. 12/10/2021: The wound is looking good. It turns out the tunnel at 12:00 is only exposed when the patient dorsiflexes his foot. It is about 2 cm in depth when he does this; when his foot is in plantarflexion, the tunnel is closed. The bone that was visible at the 12:00 tunnel is completely covered with granulation tissue, but there does feel like some exposed bone deeper into the tunnel area. There is senescent skin heaped up around the periphery. Minimal slough on the wound surface. 12/16/2021: The wound dimensions are roughly the same. The surface has nice granulation tissue. The exposed bone at the 12:00 tunnel continues to be covered with more soft tissue. 7/14; patient's wound measures smaller today. Using the wound VAC with underlying collagen. He is also being treated with HBO for underlying osteomyelitis. He tells me he is on doxycycline and ampicillin follows with infectious disease next week 12/31/2021: The wound continues to contract. Unfortunately, the area where the track pad and tubing have been rubbing continues to look like it is applying friction. He says that the home health nurses that have been applying the [facility] have been putting gauze underneath the tubing, but nonetheless there is ongoing tissue breakdown at this site. Light slough accumulation on the wound surface. The tunnel continues to contract. He is tolerating HBO without difficulty. 01/07/2022: Bridging the wound VAC away from the ankle has resulted in significant improvement in the tissue at the apex of the wound.  The tunnel is still present and is not all that much shorter, but the overall wound surface is very robust and healthy looking. Minimal slough accumulation. No concern for acute infection. 01/21/2022: The wound continues to contract and has [name] robust granulation tissue surface. The tunnel has  come in considerably and is down to about 1.4 cm. There is still bone exposed within the tunnel but the rest of it is well covered. There is some senescent epithelium at the wound margins and [name] little bit of slough on the surface. 01/28/2022: No significant change in the wound this week, but there has not been any reaccumulation of senescent epithelium or slough. The tunnel is perhaps [name] millimeter less in depth. He has been approved for Apligraf and we will apply this today. 02/04/2022: The wound has contracted somewhat and the tunnel has filled in completely. The wound surface is clean. He is here for Apligraf #2. 02/11/2022: The wound has contracted further and is now nearly flush with the surrounding skin surface. Light layer of slough. Apligraf #3 plan for today. 02/18/2022: There is [name] band of epithelium trying to cut across the superior portion of the wound. There is robust granulation tissue with just [name] light layer of slough and biofilm on the surface. There was [name] little bit of greenish drainage in the wound VAC but none appreciated on the site itself. He is here for Apligraf #4. 03/04/2022: The wound has contracted considerably. There is good granulation tissue on the surface. Minimal biofilm. He is here for Apligraf #5. 03/18/2022: The wound has epithelialized to the point that it has been divided into 2 areas. Both areas are smaller in total than at his previous visit. There is good granulation tissue present with just [name] little bit of periwound eschar and surface slough. 10/13; left medial foot. The patient has completed treatment with hyperbaric oxygen for underlying osteomyelitis he has had [name] nice response in the wound. Noted today that he still had some greenish drainage. Previously he has been treated with topical gentamicin but apparently that was stopped 2 weeks ago. Fortunately the wound is measuring smaller 10/20; wound looks better no hypergranulation minimal drainage. Granulation tissue  looks healthy using gentamicin Hydrofera Blue under compression 04/08/2022: The wound continues to contract tremendously. The response to his treatment for hypertrophic granulation tissue has been quite remarkable. There are just [name] couple of open areas remaining. 04/18/2022: His wound is healed. READMISSION 07/04/2022: He returns to clinic with [name] new ulcer adjacent to where his previous large wound had been. He says that he was wearing boots and using [name] small padded dressing to protect the freshly healed ankle wound. Apparently the dressing rolled up and rubbed on his skin, causing [name] new wound. He contacted our office last week concerned about the appearance. There is [name] small oval wound on his left medial ankle. It is fairly clean with some periwound dry skin and eschar. 07/11/2022: The wound is about the same size this week. There is [name] layer of rubbery slough on the surface. It has [name] fairly strong odor. 07/18/2022: The wound measured larger this week due to small satellite areas opening in [name] fan pattern distal to the primary wound. He continues to accumulate slough on the wound surface. The culture that I took last week only grew out low levels of skin flora. 07/25/2022: The satellite areas that had opened last week have closed. The main wound is smaller with some slough accumulation. 08/01/2022: The wound continues to contract. There  is [name] layer of slough on the surface. 08/08/2022: The wound is [name] little bit smaller again this week. Still with slough buildup. 3/18; patient presents for follow-up. He has been doing dressing changes on his own with silver alginate and antibiotic ointment. He has been at [employer] over the past 3 weeks. He has developed [name] new wound. 09/05/2022: I am not sure if the new wound that was reported last week is just the satellite area that had closed [name] couple of weeks ago and reopened. Regardless, both wounds are quite small with good epithelialization. There is some eschar  buildup around the periphery of both wounds with slough on both wound surfaces. 09/12/2022: The wounds have converged. There is some increased in depth at the distal portion. This seems to be secondary to moisture accumulation as the periwound skin is [name] bit macerated. 09/26/2022: The wound has deteriorated further. There is increased depth at the distal portion and the wound probes to bone. The patient theorizes that the boot he has been wearing is rubbing in this spot and upon inspection of the boot, there is actually [name] hard plastic boot in exactly this location, supporting his theory. 10/05/2022: The wound looks [name] little bit better this week. The area that probed to bone now has some tissue coverage. There is still some slough buildup on the wound surfaces. Patient History Information obtained from Patient. Family History Cancer - Father, Diabetes - Father,Mother,Paternal Grandparents, Heart Disease - Father, Hypertension - Father, No family history of Hereditary Spherocytosis, Kidney Disease, Lung Disease, Seizures, Stroke, Thyroid Problems, Tuberculosis. DORIS, MCGILVERY [name] [id number] Page 8 of 11 Social History Never smoker, Marital Status - Married, Alcohol Use - Rarely, Drug Use - No History, Caffeine Use - Daily. Medical History Eyes Patient has history of Cataracts - Removed 2008 Cardiovascular Patient has history of Coronary Artery Disease, Hypertension, Myocardial Infarction, Peripheral Arterial Disease Endocrine Patient has history of Type II Diabetes Musculoskeletal Patient has history of Osteomyelitis Neurologic Patient has history of Neuropathy Medical [name] Surgical History Notes nd Cardiovascular Hypercholesterolemia Abnormal EKG CABG X3 2019 Gastrointestinal GERD Musculoskeletal Diabetic foot ulcer Objective Constitutional no acute distress. Vitals Time Taken: 1:21 PM, Height: 74 in, Weight: 187 lbs, BMI: 24, Temperature: 97.5 F,  Pulse: 79 bpm, Respiratory Rate: 16 breaths/min, Blood Pressure: 131/76 mmHg. Respiratory Normal work of breathing on room air. General Notes: 10/05/2022: The wound looks [name] little bit better this week. The area that probed to bone now has some tissue coverage. There is still some slough buildup on the wound surfaces. Integumentary (Hair, Skin) Wound #2 status is Open. Original cause of wound was Pressure Injury. The date acquired was: 06/13/2022. The wound has been in treatment 13 weeks. The wound is located on the Left,Medial Ankle. The wound measures 3.9cm length x 1.1cm width x 0.3cm depth; 3.369cm^2 area and 1.011cm^3 volume. There is bone and Fat Layer (Subcutaneous Tissue) exposed. There is undermining starting at 5:00 and ending at 7:00 with [name] maximum distance of 1.2cm. There is [name] medium amount of serous drainage noted. The wound margin is distinct with the outline attached to the wound base. There is large (67-100%) pink, pale granulation within the wound bed. There is [name] small (1-33%) amount of necrotic tissue within the wound bed including Adherent Slough. The periwound skin appearance had no abnormalities noted for color. The periwound skin appearance exhibited: Callus, Dry/Scaly. The periwound skin appearance did not exhibit: Maceration. Periwound temperature was noted as No Abnormality. Assessment Active Problems ICD-10  Non-pressure chronic ulcer of left ankle with fat layer exposed Peripheral vascular disease, unspecified Type 2 diabetes mellitus with other skin ulcer Procedures Wound #2 Pre-procedure diagnosis of Wound #2 is [name] Diabetic Wound/Ulcer of the Lower Extremity located on the Left,Medial Ankle .Severity of Tissue Pre Debridement is: Fat layer exposed. There was [name] Selective/Open Wound Non-Viable Tissue Debridement with [name] total area of 3.37 sq cm performed by Colin Guess, MD. With the following instrument(s): Curette to remove Non-Viable tissue/material. Material  removed includes [facility] after achieving pain control using Lidocaine 4% T opical Solution. No specimens were taken. [name] time out was conducted at 13:31, prior to the start of the procedure. [name] Minimum amount of bleeding was controlled with Pressure. The procedure was tolerated well. Post Debridement Measurements: 3.9cm length x 1.1cm width x 0.3cm depth; 1.011cm^3 volume. Character of Wound/Ulcer Post Debridement is improved. Severity of Tissue Post Debridement is: Fat layer exposed. Post procedure Diagnosis Wound #2: Same as Pre-Procedure General Notes: scribed for Dr. Lady Gary by Samuella Bruin, RN. Pre-procedure diagnosis of Wound #2 is [name] Diabetic Wound/Ulcer of the Lower Extremity located on the Left,Medial Ankle . There was [name] Double JIMEL, MYLER [name] ([id number]) [id number] Page 9 of 11 Compression Therapy Procedure by Samuella Bruin, RN. Post procedure Diagnosis Wound #2: Same as Pre-Procedure Plan Follow-up Appointments: Return Appointment in 1 week. - Dr. Lady Gary - room 2 Anesthetic: (In clinic) Topical Lidocaine 4% applied to wound bed Bathing/ Shower/ Hygiene: May shower with protection but do not get wound dressing(s) wet. Protect dressing(s) with water repellant cover (for example, large plastic bag) or [name] cast cover and may then take shower. Edema Control - Lymphedema / SCD / Other: Elevate legs to the level of the heart or above for 30 minutes daily and/or when sitting for 3-4 times [name] day throughout the day. Avoid standing for long periods of time. Off-Loading: Open toe surgical shoe to: - left foot Other: - knee scooter to left leg, stop wearing your orthoboot Consults ordered were: Vein and Vascular - referral for abnormal ABI 0.8, TBI 0.53, and nonhealing wound to left leg ICD10: E11.622 WOUND #2: - Ankle Wound Laterality: Left, Medial Cleanser: Normal Saline 1 x Per Week/30 Days Discharge Instructions: Cleanse the wound with Normal Saline  prior to applying [name] clean dressing using gauze sponges, not tissue or cotton balls. Peri-Wound Care: Zinc Oxide Ointment 30g tube 1 x Per Week/30 Days Discharge Instructions: Apply Zinc Oxide to periwound with each dressing change Peri-Wound Care: Sween Lotion (Moisturizing lotion) 1 x Per Week/30 Days Discharge Instructions: Apply moisturizing lotion as directed Topical: Mupirocin Ointment 1 x Per Week/30 Days Discharge Instructions: Apply Mupirocin (Bactroban) as instructed Prim Dressing: Maxorb Extra Ag+ Alginate Dressing, 2x2 (in/in) (Generic) 1 x Per Week/30 Days ary Discharge Instructions: Apply to wound bed as instructed Secondary Dressing: Optifoam Non-Adhesive Dressing, 4x4 in (Generic) 1 x Per Week/30 Days Discharge Instructions: Apply over primary dressing as directed. Secondary Dressing: Zetuvit Plus 4x8 in (Generic) 1 x Per Week/30 Days Discharge Instructions: Apply over primary dressing as directed. Secured With: Transpore Surgical T ape, 2x10 (in/yd) 1 x Per Week/30 Days Discharge Instructions: Secure dressing with tape as directed. Com pression Wrap: Urgo K2 Lite, two layer compression system, regular 1 x Per Week/30 Days Discharge Instructions: Apply Urgo K2 Lite as directed (alternative to 3 layer compression). 10/05/2022: The wound looks [name] little bit better this week. The area that probed to bone now has some tissue coverage. There is still some slough  buildup on the wound surfaces. I used [name] curette to debride the slough from his wounds. We will continue topical mupirocin with silver alginate. I am going to put him in [name] 3 layer compression equivalent. We will also refer him to vascular surgery due to his diminished ABI and TBI on this side. Perhaps improving vascular flow will aid [place] in getting this wound to heal and avoiding future episodes of breakdown. Follow-up in 1 week. Electronic Signature(s) Signed: 10/05/2022 2:34:18 PM By: Colin Guess MD FACS Signed: 10/05/2022  4:03:28 PM By: Samuella Bruin Previous Signature: 10/05/2022 1:42:00 PM Version By: Colin Guess MD FACS Entered By: Samuella Bruin on 10/05/2022 14:21:47 -------------------------------------------------------------------------------- HxROS Details Patient Name: Date of Service: NA Colin [name], [name]. 10/05/2022 1:30 PM Medical Record Number: [id number] Patient Account Number: [id number] Date of Birth/Sex: Treating RN: 1955-10-05 (67 y.o. M) Primary Care Provider: [name] Other Clinician: Referring Provider: Treating Provider/Extender: [name] in Treatment: 13 Information Obtained From Patient Eyes Medical History: Positive for: Cataracts - Removed 2008 EDEN, RHO [name] ([id number]) [id number] Page 10 of 11 Cardiovascular Medical History: Positive for: Coronary Artery Disease; Hypertension; Myocardial Infarction; Peripheral Arterial Disease Past Medical History Notes: Hypercholesterolemia Abnormal EKG CABG X3 2019 Gastrointestinal Medical History: Past Medical History Notes: GERD Endocrine Medical History: Positive for: Type II Diabetes Time with diabetes: 24 years Treated with: Insulin, Oral agents Blood sugar tested every day: Yes Tested : Musculoskeletal Medical History: Positive for: Osteomyelitis Past Medical History Notes: Diabetic foot ulcer Neurologic Medical History: Positive for: Neuropathy HBO Extended History Items Eyes: Cataracts Immunizations Pneumococcal Vaccine: Received Pneumococcal Vaccination: Yes Received Pneumococcal Vaccination On or After 60th Birthday: Yes Implantable Devices Yes Family and Social History Cancer: Yes - Father; Diabetes: Yes - Father,Mother,Paternal Grandparents; Heart Disease: Yes - Father; Hereditary Spherocytosis: No; Hypertension: Yes - Father; Kidney Disease: No; Lung Disease: No; Seizures: No; Stroke: No; Thyroid Problems: No; Tuberculosis: No; Never  smoker; Marital Status - Married; Alcohol Use: Rarely; Drug Use: No History; Caffeine Use: Daily; Financial Concerns: No; Food, Clothing or Shelter Needs: No; Support System Lacking: No; Transportation Concerns: No Psychologist, prison and probation services) Signed: 10/05/2022 2:34:18 PM By: Colin Guess MD FACS Entered By: [name] on 10/05/2022 13:39:57 -------------------------------------------------------------------------------- SuperBill Details Patient Name: Date of Service: NA Colin [name]. 10/05/2022 Medical Record Number: [id number] Patient Account Number: [id number] Date of Birth/Sex: Treating RN: Jul 24, 1955 (67 y.o. M) Primary Care Provider: [name] Other Clinician: Referring Provider: Treating Provider/Extender: [name]: 13 Diagnosis Coding ICD-10 Codes Code Description [id number] Non-pressure chronic ulcer of left ankle with fat layer exposed BRYCESON, GRAPE [name] ([id number]) [id number] Page 11 of 11 I73.9 Peripheral vascular disease, unspecified E11.622 Type 2 diabetes mellitus with other skin ulcer Facility Procedures : CPT4 Code: [id number] Description: 97597 - DEBRIDE WOUND 1ST 20 SQ CM OR < ICD-10 Diagnosis Description L97.322 Non-pressure chronic ulcer of left ankle with fat layer exposed Modifier: Quantity: 1 Physician Procedures : CPT4 Code Description Modifier [id number] - WC PHYS LEVEL 4 - EST PT 25 ICD-10 Diagnosis Description L97.322 Non-pressure chronic ulcer of left ankle with fat layer exposed I73.9 Peripheral vascular disease, unspecified E11.622 Type 2 diabetes  mellitus with other skin ulcer Quantity: 1 : [id number] - WC PHYS DEBR WO ANESTH 20 SQ CM ICD-10 Diagnosis Description L97.322 Non-pressure chronic ulcer of left ankle with fat layer exposed Quantity: 1 Electronic Signature(s) Signed: 10/05/2022 1:42:17 PM By: Colin Guess MD FACS Entered By: [name] on  10/05/2022  13:42:16 

## 2022-10-10 ENCOUNTER — Encounter (HOSPITAL_BASED_OUTPATIENT_CLINIC_OR_DEPARTMENT_OTHER): Payer: BC Managed Care – PPO | Admitting: General Surgery

## 2022-10-10 DIAGNOSIS — I7389 Other specified peripheral vascular diseases: Secondary | ICD-10-CM | POA: Diagnosis not present

## 2022-10-10 DIAGNOSIS — I252 Old myocardial infarction: Secondary | ICD-10-CM | POA: Diagnosis not present

## 2022-10-10 DIAGNOSIS — Z951 Presence of aortocoronary bypass graft: Secondary | ICD-10-CM | POA: Diagnosis not present

## 2022-10-10 DIAGNOSIS — E11622 Type 2 diabetes mellitus with other skin ulcer: Secondary | ICD-10-CM | POA: Diagnosis not present

## 2022-10-10 DIAGNOSIS — E1140 Type 2 diabetes mellitus with diabetic neuropathy, unspecified: Secondary | ICD-10-CM | POA: Diagnosis not present

## 2022-10-10 DIAGNOSIS — E1151 Type 2 diabetes mellitus with diabetic peripheral angiopathy without gangrene: Secondary | ICD-10-CM | POA: Diagnosis not present

## 2022-10-10 DIAGNOSIS — I10 Essential (primary) hypertension: Secondary | ICD-10-CM | POA: Diagnosis not present

## 2022-10-10 DIAGNOSIS — I2510 Atherosclerotic heart disease of native coronary artery without angina pectoris: Secondary | ICD-10-CM | POA: Diagnosis not present

## 2022-10-10 DIAGNOSIS — L97322 Non-pressure chronic ulcer of left ankle with fat layer exposed: Secondary | ICD-10-CM | POA: Diagnosis not present

## 2022-10-11 NOTE — Progress Notes (Signed)
YASUO, PHIMMASONE A ([id number]) [id number] Page 1 of 11 Visit Report for 10/10/2022 Chief Complaint Document Details Patient Name: Date of Service: NA [name] 10/10/2022 2:45 PM Medical Record Number: [id number] Patient Account Number: [id number] Date of Birth/Sex: Treating RN: 15-Feb-1956 (67 y.o. M) Primary Care Provider: [name] Other Clinician: Referring Provider: Treating Provider/Extender: [name] in Treatment: 14 Information Obtained from: Patient Chief Complaint Patients presents for treatment of an open diabetic ulcer with exposed bone and osteomyelitis 07/04/2022: Diabetic ankle ulcer Electronic Signature(s) Signed: 10/10/2022 3:50:14 PM By: [name] MD FACS Entered By: [name] on 10/10/2022 15:50:14 -------------------------------------------------------------------------------- Debridement Details Patient Name: Date of Service: NA [name] 10/10/2022 2:45 PM Medical Record Number: [id number] Patient Account Number: [id number] Date of Birth/Sex: Treating RN: 18-Dec-1955 (67 y.o. [name] Primary Care Provider: [name] Other Clinician: Referring Provider: Treating Provider/Extender: [name] in Treatment: 14 Debridement Performed for Assessment: Wound #2 Left,Medial Ankle Performed By: Physician [name], MD Debridement Type: Debridement Severity of Tissue Pre Debridement: Fat layer exposed Level of Consciousness (Pre-procedure): Awake and Alert Pre-procedure Verification/Time Out Yes - 15:20 Taken: Start Time: 15:20 Percent of Wound Bed Debrided: 100% T Area Debrided (cm): otal 3.98 Tissue and other material debrided: Viable, Non-Viable, Slough, Subcutaneous, Skin: Epidermis, Slough Level: Skin/Subcutaneous Tissue Debridement Description: Excisional Instrument: Curette Specimen: Tissue Culture Number of Specimens T aken:  1 Bleeding: Minimum Hemostasis Achieved: Pressure Procedural Pain: 0 Post Procedural Pain: 0 Response to Treatment: Procedure was tolerated well Level of Consciousness (Post- Awake and Alert procedure): Post Debridement Measurements of Total Wound Length: (cm) 3.9 Width: (cm) 1.3 Depth: (cm) 1.3 Volume: (cm) 5.177 Character of Wound/Ulcer Post Debridement: Stable Severity of Tissue Post Debridement: Fat layer exposed Post Procedure Diagnosis Same as Pre-procedure Notes scribed for Dr. Lady Gary By Zenaida Deed, RN Lasandra Beech A ([id number]) [phone].pdf Page 2 of 11 Electronic Signature(s) Signed: 10/10/2022 4:00:45 PM By: [name] MD FACS Signed: 10/10/2022 4:53:54 PM By: Zenaida Deed RN, BSN Entered By: Zenaida Deed on 10/10/2022 15:26:03 -------------------------------------------------------------------------------- HPI Details Patient Name: Date of Service: NA [name] 10/10/2022 2:45 PM Medical Record Number: [id number] Patient Account Number: [id number] Date of Birth/Sex: Treating RN: Jan 29, 1956 (67 y.o. M) Primary Care Provider: [name] Other Clinician: Referring Provider: Treating Provider/Extender: [name] in Treatment: 14 History of Present Illness HPI Description: ADMISSION 08/25/2021 This is a 67 year old man who initially presented to his primary care provider in September 2022 with pain in his left foot. He was sent for an x-ray and while the x-ray was being performed, the tech pointed out a wound on his foot that the patient was not aware existed. He does have type 2 diabetes with significant neuropathy. His diabetes is suboptimally controlled with his most recent A1c being 8.5. He also has a history of coronary artery disease status post three- vessel CABG. he was initially seen by orthopedics, but they referred him to [facility]. He has undergone at least  7 operations/debridements and several applications of skin substitute under the care of podiatry. He has been in a wound VAC for much of this time. His most recent procedure was July 28, 2021. A portion of the talus was biopsied and was found to be consistent with osteomyelitis. Culture also returned positive for corynebacterium. He was seen on August 16, 2021 by infectious disease. A PICC line has been placed and he will  be receiving a 6-week course of IV daptomycin and cefepime. In October 2022, he underwent lower extremity vascular studies. Results are copied here: Right: Resting right ankle-brachial index is within normal range. No evidence of significant right lower extremity arterial disease. The right toe-brachial index is abnormal. Left: Resting left ankle-brachial index indicates mild left lower extremity arterial disease. The left toe-brachial index is abnormal. He has not been seen by vascular surgery despite these findings. He presented to clinic today in a cam boot and is using a knee scooter to offload. Wound VAC was in place. Once this was removed, a large ulcer was identified on the left midfoot/ankle. Bone is frankly exposed. There is no malodorous or purulent drainage. There is some granulation tissue over the central portion of the exposed bone. There is a tunnel that extends posteriorly for roughly 10 cm. It has been discussed with him by multiple providers that he is at very high risk of losing his lower leg because of this wound. He is extremely eager to avoid this outcome and is here today to review his options as well as receive ongoing wound care. 09/03/2021: Here for reevaluation of his wound. There does not appear to have been any substantial improvement overall since our last visit. He has been in a wound VAC with white foam overlying the exposed bone. We are working on getting him approved for hyperbaric oxygen therapy. 09/10/2021: We are in the process of getting him  cleared to begin hyperbaric oxygen therapy. He still needs to obtain a chest x-ray. Although the wound measurements are roughly the same, I think the overall appearance of the wound is better. The exposed bone has a bit more granulation tissue covering it. He has not received a vascular surgery appointment to reevaluate his flow to the wound. 09/17/2021: He has been approved for hyperbaric oxygen therapy and completed his chest x-ray, which I reviewed and it appears normal. The tunnels at the 12 and 10:00 positions are smaller. There is more granulation tissue covering the exposed bone and the undermining has decreased. He still has not received a vascular surgery appointment. 09/24/2021: He initiated hyperbaric oxygen therapy this week and is tolerating it well. He has an appointment with vascular surgery coming up on May 16. The granulation tissue is covering more of the exposed bone and both tunnels are a bit smaller. 10/01/2021: He continues to tolerate hyperbaric oxygen therapy. He saw infectious disease and they are planning to pull his PICC line. He has been initiated on oral antibiotics (doxycycline and Augmentin). The wound looks about the same but the tunnels are a little bit smaller. The skin seems to be contracting somewhat around the exposed bone. 10/08/2021: The wound is still about the same size, but the tunnels continue to come in and the skin is contracting around the exposed bone. He continues to have some accumulation of necrotic material in the inferoposterior aspect of the wound as well as accumulation at the 12:00 tunnel area. 10/15/2021: The wound is smaller today. The tunnels continue to come in. There is less necrotic tissue present. He does have some periwound maceration. 10/22/2021: The wound is about the same size. There is a little bit less undermining at the distal portion. The exposed bone is dark and I am not sure if this is staining from silver nitrate or his VAC sponge or if  it represents necrosis. The tunnels are shallower but he does have some serous drainage coming from the 10:00 tunnel. He continues to  tolerate hyperbaric oxygen therapy well. 10/29/2021: The undermining continues to improve. The tunnels are about the same. He has good granulation tissue overlying the majority of the exposed bone. It does appear that perhaps the tubing from his wound VAC has been eroding the skin at the 12 clock position. He continues to accumulate senescent epithelium around the borders of the wound. 11/05/2021: The undermining is almost completely resolved. The tunnels have contracted fairly significantly. No significant slough or debris accumulation. There is still senescent epithelium accumulation around the borders of the wound. He has been tolerating hyperbaric oxygen therapy well. 11/12/2021: Despite the measurements of the wound being about the same, the wound has changed in its shape and overall, I think it is improved. The undermining has resolved and the tunnels continue to shorten. There is good granulation tissue encroaching over the small area of bone that has remained exposed at the 12 o'clock position. Minimal slough accumulation. He continues to tolerate hyperbaric oxygen therapy well. 11/19/2021: I took a PCR culture last week. There was overgrowth of yeast. He is already taking suppressive doxycycline and Augmentin. I added fluconazole to SOMA, LIZAK A ([id number]) [id number] Page 3 of 11 his regimen. The wound is smaller again today. The tunnels continue to shorten. He continues to do well with hyperbaric oxygen therapy. 11/26/2021: For some reason, his foot has become macerated. The wound is narrower but about the same dimensions in its longitudinal aspect. The tunnels continue to shorten. He has some slough buildup on the wound as well as some heaped up senescent epithelium around the perimeter. 12/03/2021: No further maceration of his foot  has occurred. The wound has contracted quite significantly from last week. The tunnel at 10:00 is closed. The tunnel at 12:00 is down to just a couple of millimeters. No other undermining is present. There is soft tissue coverage of the previously exposed bone. There is just a bit of slough and biofilm on the wound surface. 12/10/2021: The wound is looking good. It turns out the tunnel at 12:00 is only exposed when the patient dorsiflexes his foot. It is about 2 cm in depth when he does this; when his foot is in plantarflexion, the tunnel is closed. The bone that was visible at the 12:00 tunnel is completely covered with granulation tissue, but there does feel like some exposed bone deeper into the tunnel area. There is senescent skin heaped up around the periphery. Minimal slough on the wound surface. 12/16/2021: The wound dimensions are roughly the same. The surface has nice granulation tissue. The exposed bone at the 12:00 tunnel continues to be covered with more soft tissue. 7/14; patient's wound measures smaller today. Using the wound VAC with underlying collagen. He is also being treated with HBO for underlying osteomyelitis. He tells me he is on doxycycline and ampicillin follows with infectious disease next week 12/31/2021: The wound continues to contract. Unfortunately, the area where the track pad and tubing have been rubbing continues to look like it is applying friction. He says that the home health nurses that have been applying the [facility] have been putting gauze underneath the tubing, but nonetheless there is ongoing tissue breakdown at this site. Light slough accumulation on the wound surface. The tunnel continues to contract. He is tolerating HBO without difficulty. 01/07/2022: Bridging the wound VAC away from the ankle has resulted in significant improvement in the tissue at the apex of the wound. The tunnel is still present and is not all that much shorter, but the overall  wound surface is  very robust and healthy looking. Minimal slough accumulation. No concern for acute infection. 01/21/2022: The wound continues to contract and has a robust granulation tissue surface. The tunnel has come in considerably and is down to about 1.4 cm. There is still bone exposed within the tunnel but the rest of it is well covered. There is some senescent epithelium at the wound margins and a little bit of slough on the surface. 01/28/2022: No significant change in the wound this week, but there has not been any reaccumulation of senescent epithelium or slough. The tunnel is perhaps a millimeter less in depth. He has been approved for Apligraf and we will apply this today. 02/04/2022: The wound has contracted somewhat and the tunnel has filled in completely. The wound surface is clean. He is here for Apligraf #2. 02/11/2022: The wound has contracted further and is now nearly flush with the surrounding skin surface. Light layer of slough. Apligraf #3 plan for today. 02/18/2022: There is a band of epithelium trying to cut across the superior portion of the wound. There is robust granulation tissue with just a light layer of slough and biofilm on the surface. There was a little bit of greenish drainage in the wound VAC but none appreciated on the site itself. He is here for Apligraf #4. 03/04/2022: The wound has contracted considerably. There is good granulation tissue on the surface. Minimal biofilm. He is here for Apligraf #5. 03/18/2022: The wound has epithelialized to the point that it has been divided into 2 areas. Both areas are smaller in total than at his previous visit. There is good granulation tissue present with just a little bit of periwound eschar and surface slough. 10/13; left medial foot. The patient has completed treatment with hyperbaric oxygen for underlying osteomyelitis he has had a nice response in the wound. Noted today that he still had some greenish drainage. Previously he has been treated  with topical gentamicin but apparently that was stopped 2 weeks ago. Fortunately the wound is measuring smaller 10/20; wound looks better no hypergranulation minimal drainage. Granulation tissue looks healthy using gentamicin Hydrofera Blue under compression 04/08/2022: The wound continues to contract tremendously. The response to his treatment for hypertrophic granulation tissue has been quite remarkable. There are just a couple of open areas remaining. 04/18/2022: His wound is healed. READMISSION 07/04/2022: He returns to clinic with a new ulcer adjacent to where his previous large wound had been. He says that he was wearing boots and using a small padded dressing to protect the freshly healed ankle wound. Apparently the dressing rolled up and rubbed on his skin, causing a new wound. He contacted our office last week concerned about the appearance. There is a small oval wound on his left medial ankle. It is fairly clean with some periwound dry skin and eschar. 07/11/2022: The wound is about the same size this week. There is a layer of rubbery slough on the surface. It has a fairly strong odor. 07/18/2022: The wound measured larger this week due to small satellite areas opening in a fan pattern distal to the primary wound. He continues to accumulate slough on the wound surface. The culture that I took last week only grew out low levels of skin flora. 07/25/2022: The satellite areas that had opened last week have closed. The main wound is smaller with some slough accumulation. 08/01/2022: The wound continues to contract. There is a layer of slough on the surface. 08/08/2022: The wound is a little bit  smaller again this week. Still with slough buildup. 3/18; patient presents for follow-up. He has been doing dressing changes on his own with silver alginate and antibiotic ointment. He has been at [employer] over the past 3 weeks. He has developed a new wound. 09/05/2022: I am not sure if the new wound that  was reported last week is just the satellite area that had closed a couple of weeks ago and reopened. Regardless, both wounds are quite small with good epithelialization. There is some eschar buildup around the periphery of both wounds with slough on both wound surfaces. 09/12/2022: The wounds have converged. There is some increased in depth at the distal portion. This seems to be secondary to moisture accumulation as the periwound skin is a bit macerated. 09/26/2022: The wound has deteriorated further. There is increased depth at the distal portion and the wound probes to bone. The patient theorizes that the boot he has been wearing is rubbing in this spot and upon inspection of the boot, there is actually a hard plastic boot in exactly this location, supporting his theory. 10/05/2022: The wound looks a little bit better this week. The area that probed to bone now has some tissue coverage. There is still some slough buildup on the wound surfaces. 10/10/2022: The wound is strongly malodorous today. There is no purulent drainage, nor any increased erythema. There is some tissue maceration around the ISABELLA, IDA A ([id number]) [phone].pdf Page 4 of 11 wound, but he did not have a whole lot of drainage on his dressings. There is slough on the wound surface. Electronic Signature(s) Signed: 10/10/2022 3:52:28 PM By: [name] MD FACS Entered By: [name] on 10/10/2022 15:52:28 -------------------------------------------------------------------------------- Physical Exam Details Patient Name: Date of Service: NA [name] 10/10/2022 2:45 PM Medical Record Number: [id number] Patient Account Number: [id number] Date of Birth/Sex: Treating RN: 1956-05-06 (67 y.o. M) Primary Care Provider: [name] Other Clinician: Referring Provider: Treating Provider/Extender: [name]: 14 Constitutional Hypertensive, asymptomatic.  . . . no acute distress. Respiratory Normal work of breathing on room air. Notes 10/10/2022: The wound is strongly malodorous today. There is no purulent drainage, nor any increased erythema. There is some tissue maceration around the wound, but he did not have a whole lot of drainage on his dressings. There is slough on the wound surface. Electronic Signature(s) Signed: 10/10/2022 3:53:51 PM By: [name] MD FACS Entered By: [name] on 10/10/2022 15:53:51 -------------------------------------------------------------------------------- Physician Orders Details Patient Name: Date of Service: NA [name] 10/10/2022 2:45 PM Medical Record Number: [id number] Patient Account Number: [id number] Date of Birth/Sex: Treating RN: [birth year] (67 y.o. [name] Primary Care Provider: [name] Other Clinician: Referring Provider: Treating Provider/Extender: [name] in Treatment: 14 Verbal / Phone Orders: No Diagnosis Coding ICD-10 Coding Code Description [phone] Non-pressure chronic ulcer of left ankle with fat layer exposed I73.9 Peripheral vascular disease, unspecified E11.622 Type 2 diabetes mellitus with other skin ulcer Follow-up Appointments ppointment in 1 week. - Dr. Lady Gary - room 2 Return A Monday 5/6 @ 3:00 pm Anesthetic (In clinic) Topical Lidocaine 4% applied to wound bed Bathing/ Shower/ Hygiene May shower with protection but do not get wound dressing(s) wet. Protect dressing(s) with water repellant cover (for example, large plastic bag) or a cast cover and may then take shower. Edema Control - Lymphedema / SCD / Other Elevate legs to the level of the heart or above for 30 minutes  daily and/or when sitting for 3-4 times a day throughout the day. Avoid standing for long periods of time. Off-Loading Open toe surgical shoe to: - left foot Other: - knee scooter to left leg, stop wearing your orthoboot Wound  Treatment CARREL, LEATHER A ([id number]) [phone].pdf Page 5 of 11 Wound #2 - Ankle Wound Laterality: Left, Medial Cleanser: Normal Saline 1 x Per Week/30 Days Discharge Instructions: Cleanse the wound with Normal Saline prior to applying a clean dressing using gauze sponges, not tissue or cotton balls. Peri-Wound Care: Zinc Oxide Ointment 30g tube 1 x Per Week/30 Days Discharge Instructions: Apply Zinc Oxide to periwound with each dressing change Peri-Wound Care: Sween Lotion (Moisturizing lotion) 1 x Per Week/30 Days Discharge Instructions: Apply moisturizing lotion as directed Topical: Gentamicin 1 x Per Week/30 Days Discharge Instructions: As directed by physician Topical: Mupirocin Ointment 1 x Per Week/30 Days Discharge Instructions: Apply Mupirocin (Bactroban) as instructed Prim Dressing: Maxorb Extra Ag+ Alginate Dressing, 2x2 (in/in) (Generic) 1 x Per Week/30 Days ary Discharge Instructions: Apply to wound bed as instructed Secondary Dressing: Woven Gauze Sponge, Non-Sterile 4x4 in 1 x Per Week/30 Days Discharge Instructions: Apply over primary dressing as directed. Secondary Dressing: Zetuvit Plus 4x8 in (Generic) 1 x Per Week/30 Days Discharge Instructions: Apply over primary dressing as directed. Secured With: Transpore Surgical Tape, 2x10 (in/yd) 1 x Per Week/30 Days Discharge Instructions: Secure dressing with tape as directed. Compression Wrap: Urgo K2 Lite, two layer compression system, regular 1 x Per Week/30 Days Discharge Instructions: Apply Urgo K2 Lite as directed (alternative to 3 layer compression). Laboratory naerobe culture (MICRO) - left ankle Bacteria identified in Unspecified specimen by A LOINC Code: 635-3 Convenience Name: Anaerobic culture Electronic Signature(s) Signed: 10/10/2022 4:00:45 PM By: [name] MD FACS Entered By: [name] on 10/10/2022  15:56:03 -------------------------------------------------------------------------------- Problem List Details Patient Name: Date of Service: NA [name] 10/10/2022 2:45 PM Medical Record Number: [id number] Patient Account Number: [id number] Date of Birth/Sex: Treating RN: 06/10/1956 (67 y.o. [name] Primary Care Provider: [name] Other Clinician: Referring Provider: Treating Provider/Extender: [name]: 14 Active Problems ICD-10 Encounter Code Description Active Date MDM Diagnosis L97.322 Non-pressure chronic ulcer of left ankle with fat layer exposed 07/04/2022 No Yes I73.9 Peripheral vascular disease, unspecified 07/04/2022 No Yes E11.622 Type 2 diabetes mellitus with other skin ulcer 07/04/2022 No Yes Inactive Problems JEREN, DUFRANE A ([id number]) [id number] Page 6 of 11 Resolved Problems Electronic Signature(s) Signed: 10/10/2022 3:49:55 PM By: [name] MD FACS Entered By: [name] on 10/10/2022 15:49:55 -------------------------------------------------------------------------------- Progress Note Details Patient Name: Date of Service: NA [name] 10/10/2022 2:45 PM Medical Record Number: [id number] Patient Account Number: [id number] Date of Birth/Sex: Treating RN: [birth year] (67 y.o. M) Primary Care Provider: [name] Other Clinician: Referring Provider: Treating Provider/Extender: [name] in Treatment: 14 Subjective Chief Complaint Information obtained from Patient Patients presents for treatment of an open diabetic ulcer with exposed bone and osteomyelitis 07/04/2022: Diabetic ankle ulcer History of Present Illness (HPI) ADMISSION 08/25/2021 This is a 67 year old man who initially presented to his primary care provider in September 2022 with pain in his left foot. He was sent for an x-ray and while the x-ray was being performed, the  tech pointed out a wound on his foot that the patient was not aware existed. He does have type 2 diabetes with significant neuropathy. His diabetes is suboptimally controlled with his most recent A1c being  8.5. He also has a history of coronary artery disease status post three- vessel CABG. he was initially seen by orthopedics, but they referred him to [facility]. He has undergone at least 7 operations/debridements and several applications of skin substitute under the care of podiatry. He has been in a wound VAC for much of this time. His most recent procedure was July 28, 2021. A portion of the talus was biopsied and was found to be consistent with osteomyelitis. Culture also returned positive for corynebacterium. He was seen on August 16, 2021 by infectious disease. A PICC line has been placed and he will be receiving a 6-week course of IV daptomycin and cefepime. In October 2022, he underwent lower extremity vascular studies. Results are copied here: Right: Resting right ankle-brachial index is within normal range. No evidence of significant right lower extremity arterial disease. The right toe-brachial index is abnormal. Left: Resting left ankle-brachial index indicates mild left lower extremity arterial disease. The left toe-brachial index is abnormal. He has not been seen by vascular surgery despite these findings. He presented to clinic today in a cam boot and is using a knee scooter to offload. Wound VAC was in place. Once this was removed, a large ulcer was identified on the left midfoot/ankle. Bone is frankly exposed. There is no malodorous or purulent drainage. There is some granulation tissue over the central portion of the exposed bone. There is a tunnel that extends posteriorly for roughly 10 cm. It has been discussed with him by multiple providers that he is at very high risk of losing his lower leg because of this wound. He is extremely eager to avoid this  outcome and is here today to review his options as well as receive ongoing wound care. 09/03/2021: Here for reevaluation of his wound. There does not appear to have been any substantial improvement overall since our last visit. He has been in a wound VAC with white foam overlying the exposed bone. We are working on getting him approved for hyperbaric oxygen therapy. 09/10/2021: We are in the process of getting him cleared to begin hyperbaric oxygen therapy. He still needs to obtain a chest x-ray. Although the wound measurements are roughly the same, I think the overall appearance of the wound is better. The exposed bone has a bit more granulation tissue covering it. He has not received a vascular surgery appointment to reevaluate his flow to the wound. 09/17/2021: He has been approved for hyperbaric oxygen therapy and completed his chest x-ray, which I reviewed and it appears normal. The tunnels at the 12 and 10:00 positions are smaller. There is more granulation tissue covering the exposed bone and the undermining has decreased. He still has not received a vascular surgery appointment. 09/24/2021: He initiated hyperbaric oxygen therapy this week and is tolerating it well. He has an appointment with vascular surgery coming up on May 16. The granulation tissue is covering more of the exposed bone and both tunnels are a bit smaller. 10/01/2021: He continues to tolerate hyperbaric oxygen therapy. He saw infectious disease and they are planning to pull his PICC line. He has been initiated on oral antibiotics (doxycycline and Augmentin). The wound looks about the same but the tunnels are a little bit smaller. The skin seems to be contracting somewhat around the exposed bone. 10/08/2021: The wound is still about the same size, but the tunnels continue to come in and the skin is contracting around the exposed bone. He continues to have some  accumulation of necrotic material in the inferoposterior aspect of the  wound as well as accumulation at the 12:00 tunnel area. 10/15/2021: The wound is smaller today. The tunnels continue to come in. There is less necrotic tissue present. He does have some periwound maceration. 10/22/2021: The wound is about the same size. There is a little bit less undermining at the distal portion. The exposed bone is dark and I am not sure if this is staining from silver nitrate or his VAC sponge or if it represents necrosis. The tunnels are shallower but he does have some serous drainage coming from the 10:00 tunnel. He continues to tolerate hyperbaric oxygen therapy well. 10/29/2021: The undermining continues to improve. The tunnels are about the same. He has good granulation tissue overlying the majority of the exposed bone. It does appear that perhaps the tubing from his wound VAC has been eroding the skin at the 12 clock position. He continues to accumulate senescent epithelium around the borders of the wound. 11/05/2021: The undermining is almost completely resolved. The tunnels have contracted fairly significantly. No significant slough or debris accumulation. There MACIO, KISSOON A ([id number]) [id number] Page 7 of 11 is still senescent epithelium accumulation around the borders of the wound. He has been tolerating hyperbaric oxygen therapy well. 11/12/2021: Despite the measurements of the wound being about the same, the wound has changed in its shape and overall, I think it is improved. The undermining has resolved and the tunnels continue to shorten. There is good granulation tissue encroaching over the small area of bone that has remained exposed at the 12 o'clock position. Minimal slough accumulation. He continues to tolerate hyperbaric oxygen therapy well. 11/19/2021: I took a PCR culture last week. There was overgrowth of yeast. He is already taking suppressive doxycycline and Augmentin. I added fluconazole to his regimen. The wound is smaller again  today. The tunnels continue to shorten. He continues to do well with hyperbaric oxygen therapy. 11/26/2021: For some reason, his foot has become macerated. The wound is narrower but about the same dimensions in its longitudinal aspect. The tunnels continue to shorten. He has some slough buildup on the wound as well as some heaped up senescent epithelium around the perimeter. 12/03/2021: No further maceration of his foot has occurred. The wound has contracted quite significantly from last week. The tunnel at 10:00 is closed. The tunnel at 12:00 is down to just a couple of millimeters. No other undermining is present. There is soft tissue coverage of the previously exposed bone. There is just a bit of slough and biofilm on the wound surface. 12/10/2021: The wound is looking good. It turns out the tunnel at 12:00 is only exposed when the patient dorsiflexes his foot. It is about 2 cm in depth when he does this; when his foot is in plantarflexion, the tunnel is closed. The bone that was visible at the 12:00 tunnel is completely covered with granulation tissue, but there does feel like some exposed bone deeper into the tunnel area. There is senescent skin heaped up around the periphery. Minimal slough on the wound surface. 12/16/2021: The wound dimensions are roughly the same. The surface has nice granulation tissue. The exposed bone at the 12:00 tunnel continues to be covered with more soft tissue. 7/14; patient's wound measures smaller today. Using the wound VAC with underlying collagen. He is also being treated with HBO for underlying osteomyelitis. He tells me he is on doxycycline and ampicillin follows with infectious disease next week 12/31/2021: The wound  continues to contract. Unfortunately, the area where the track pad and tubing have been rubbing continues to look like it is applying friction. He says that the home health nurses that have been applying the [facility] have been putting gauze underneath the  tubing, but nonetheless there is ongoing tissue breakdown at this site. Light slough accumulation on the wound surface. The tunnel continues to contract. He is tolerating HBO without difficulty. 01/07/2022: Bridging the wound VAC away from the ankle has resulted in significant improvement in the tissue at the apex of the wound. The tunnel is still present and is not all that much shorter, but the overall wound surface is very robust and healthy looking. Minimal slough accumulation. No concern for acute infection. 01/21/2022: The wound continues to contract and has a robust granulation tissue surface. The tunnel has come in considerably and is down to about 1.4 cm. There is still bone exposed within the tunnel but the rest of it is well covered. There is some senescent epithelium at the wound margins and a little bit of slough on the surface. 01/28/2022: No significant change in the wound this week, but there has not been any reaccumulation of senescent epithelium or slough. The tunnel is perhaps a millimeter less in depth. He has been approved for Apligraf and we will apply this today. 02/04/2022: The wound has contracted somewhat and the tunnel has filled in completely. The wound surface is clean. He is here for Apligraf #2. 02/11/2022: The wound has contracted further and is now nearly flush with the surrounding skin surface. Light layer of slough. Apligraf #3 plan for today. 02/18/2022: There is a band of epithelium trying to cut across the superior portion of the wound. There is robust granulation tissue with just a light layer of slough and biofilm on the surface. There was a little bit of greenish drainage in the wound VAC but none appreciated on the site itself. He is here for Apligraf #4. 03/04/2022: The wound has contracted considerably. There is good granulation tissue on the surface. Minimal biofilm. He is here for Apligraf #5. 03/18/2022: The wound has epithelialized to the point that it has been  divided into 2 areas. Both areas are smaller in total than at his previous visit. There is good granulation tissue present with just a little bit of periwound eschar and surface slough. 10/13; left medial foot. The patient has completed treatment with hyperbaric oxygen for underlying osteomyelitis he has had a nice response in the wound. Noted today that he still had some greenish drainage. Previously he has been treated with topical gentamicin but apparently that was stopped 2 weeks ago. Fortunately the wound is measuring smaller 10/20; wound looks better no hypergranulation minimal drainage. Granulation tissue looks healthy using gentamicin Hydrofera Blue under compression 04/08/2022: The wound continues to contract tremendously. The response to his treatment for hypertrophic granulation tissue has been quite remarkable. There are just a couple of open areas remaining. 04/18/2022: His wound is healed. READMISSION 07/04/2022: He returns to clinic with a new ulcer adjacent to where his previous large wound had been. He says that he was wearing boots and using a small padded dressing to protect the freshly healed ankle wound. Apparently the dressing rolled up and rubbed on his skin, causing a new wound. He contacted our office last week concerned about the appearance. There is a small oval wound on his left medial ankle. It is fairly clean with some periwound dry skin and eschar. 07/11/2022: The wound is about  the same size this week. There is a layer of rubbery slough on the surface. It has a fairly strong odor. 07/18/2022: The wound measured larger this week due to small satellite areas opening in a fan pattern distal to the primary wound. He continues to accumulate slough on the wound surface. The culture that I took last week only grew out low levels of skin flora. 07/25/2022: The satellite areas that had opened last week have closed. The main wound is smaller with some slough accumulation. 08/01/2022:  The wound continues to contract. There is a layer of slough on the surface. 08/08/2022: The wound is a little bit smaller again this week. Still with slough buildup. 3/18; patient presents for follow-up. He has been doing dressing changes on his own with silver alginate and antibiotic ointment. He has been at [employer] over the past 3 weeks. He has developed a new wound. 09/05/2022: I am not sure if the new wound that was reported last week is just the satellite area that had closed a couple of weeks ago and reopened. Regardless, both wounds are quite small with good epithelialization. There is some eschar buildup around the periphery of both wounds with slough on both wound surfaces. 09/12/2022: The wounds have converged. There is some increased in depth at the distal portion. This seems to be secondary to moisture accumulation as the periwound skin is a bit macerated. ZOHAIR, EPP A ([id number]) [id number] Page 8 of 11 09/26/2022: The wound has deteriorated further. There is increased depth at the distal portion and the wound probes to bone. The patient theorizes that the boot he has been wearing is rubbing in this spot and upon inspection of the boot, there is actually a hard plastic boot in exactly this location, supporting his theory. 10/05/2022: The wound looks a little bit better this week. The area that probed to bone now has some tissue coverage. There is still some slough buildup on the wound surfaces. 10/10/2022: The wound is strongly malodorous today. There is no purulent drainage, nor any increased erythema. There is some tissue maceration around the wound, but he did not have a whole lot of drainage on his dressings. There is slough on the wound surface. Patient History Information obtained from Patient. Family History Cancer - Father, Diabetes - Father,Mother,Paternal Grandparents, Heart Disease - Father, Hypertension - Father, No family history of  Hereditary Spherocytosis, Kidney Disease, Lung Disease, Seizures, Stroke, Thyroid Problems, Tuberculosis. Social History Never smoker, Marital Status - Married, Alcohol Use - Rarely, Drug Use - No History, Caffeine Use - Daily. Medical History Eyes Patient has history of Cataracts - Removed 2008 Cardiovascular Patient has history of Coronary Artery Disease, Hypertension, Myocardial Infarction, Peripheral Arterial Disease Endocrine Patient has history of Type II Diabetes Musculoskeletal Patient has history of Osteomyelitis Neurologic Patient has history of Neuropathy Medical A Surgical History Notes nd Cardiovascular Hypercholesterolemia Abnormal EKG CABG X3 2019 Gastrointestinal GERD Musculoskeletal Diabetic foot ulcer Objective Constitutional Hypertensive, asymptomatic. no acute distress. Vitals Time Taken: 2:57 PM, Height: 74 in, Weight: 187 lbs, BMI: 24, Temperature: 98.4 F, Pulse: 85 bpm, Respiratory Rate: 18 breaths/min, Blood Pressure: 166/89 mmHg, Capillary Blood Glucose: 87 mg/dl. General Notes: glucose per pt report this am Respiratory Normal work of breathing on room air. General Notes: 10/10/2022: The wound is strongly malodorous today. There is no purulent drainage, nor any increased erythema. There is some tissue maceration around the wound, but he did not have a whole lot of drainage on his dressings. There is slough  on the wound surface. Integumentary (Hair, Skin) Wound #2 status is Open. Original cause of wound was Pressure Injury. The date acquired was: 06/13/2022. The wound has been in treatment 14 weeks. The wound is located on the Left,Medial Ankle. The wound measures 3.9cm length x 1.3cm width x 1.3cm depth; 3.982cm^2 area and 5.177cm^3 volume. There is bone and Fat Layer (Subcutaneous Tissue) exposed. There is no tunneling noted, however, there is undermining starting at 12:00 and ending at 9:00 with a maximum distance of 1cm. There is a medium amount of  purulent drainage noted. Foul odor after cleansing was noted. The wound margin is well defined and not attached to the wound base. There is medium (34-66%) pink, pale granulation within the wound bed. There is a medium (34-66%) amount of necrotic tissue within the wound bed. The periwound skin appearance had no abnormalities noted for color. The periwound skin appearance exhibited: Callus, Maceration. The periwound skin appearance did not exhibit: Dry/Scaly. Periwound temperature was noted as No Abnormality. Assessment Active Problems ICD-10 Non-pressure chronic ulcer of left ankle with fat layer exposed Peripheral vascular disease, unspecified Type 2 diabetes mellitus with other skin ulcer Ton, Jaquavis A ([id number]) [id number] Page 9 of 11 Procedures Wound #2 Pre-procedure diagnosis of Wound #2 is a Diabetic Wound/Ulcer of the Lower Extremity located on the Left,Medial Ankle .Severity of Tissue Pre Debridement is: Fat layer exposed. There was a Excisional Skin/Subcutaneous Tissue Debridement with a total area of 3.98 sq cm performed by [name], MD. With the following instrument(s): Curette to remove Viable and Non-Viable tissue/material. Material removed includes Subcutaneous Tissue, Slough, and Skin: Epidermis. 1 specimen was taken by a Tissue Culture and sent to the lab per facility protocol. A time out was conducted at 15:20, prior to the start of the procedure. A Minimum amount of bleeding was controlled with Pressure. The procedure was tolerated well with a pain level of 0 throughout and a pain level of 0 following the procedure. Post Debridement Measurements: 3.9cm length x 1.3cm width x 1.3cm depth; 5.177cm^3 volume. Character of Wound/Ulcer Post Debridement is stable. Severity of Tissue Post Debridement is: Fat layer exposed. Post procedure Diagnosis Wound #2: Same as Pre-Procedure General Notes: scribed for Dr. Lady Gary By Zenaida Deed,  RN. Plan Follow-up Appointments: Return Appointment in 1 week. - Dr. Lady Gary - room 2 Monday 5/6 @ 3:00 pm Anesthetic: (In clinic) Topical Lidocaine 4% applied to wound bed Bathing/ Shower/ Hygiene: May shower with protection but do not get wound dressing(s) wet. Protect dressing(s) with water repellant cover (for example, large plastic bag) or a cast cover and may then take shower. Edema Control - Lymphedema / SCD / Other: Elevate legs to the level of the heart or above for 30 minutes daily and/or when sitting for 3-4 times a day throughout the day. Avoid standing for long periods of time. Off-Loading: Open toe surgical shoe to: - left foot Other: - knee scooter to left leg, stop wearing your orthoboot Laboratory ordered were: Anaerobic culture - left ankle WOUND #2: - Ankle Wound Laterality: Left, Medial Cleanser: Normal Saline 1 x Per Week/30 Days Discharge Instructions: Cleanse the wound with Normal Saline prior to applying a clean dressing using gauze sponges, not tissue or cotton balls. Peri-Wound Care: Zinc Oxide Ointment 30g tube 1 x Per Week/30 Days Discharge Instructions: Apply Zinc Oxide to periwound with each dressing change Peri-Wound Care: Sween Lotion (Moisturizing lotion) 1 x Per Week/30 Days Discharge Instructions: Apply moisturizing lotion as directed Topical: Gentamicin 1 x Per  Week/30 Days Discharge Instructions: As directed by physician Topical: Mupirocin Ointment 1 x Per Week/30 Days Discharge Instructions: Apply Mupirocin (Bactroban) as instructed Prim Dressing: Maxorb Extra Ag+ Alginate Dressing, 2x2 (in/in) (Generic) 1 x Per Week/30 Days ary Discharge Instructions: Apply to wound bed as instructed Secondary Dressing: Woven Gauze Sponge, Non-Sterile 4x4 in 1 x Per Week/30 Days Discharge Instructions: Apply over primary dressing as directed. Secondary Dressing: Zetuvit Plus 4x8 in (Generic) 1 x Per Week/30 Days Discharge Instructions: Apply over primary  dressing as directed. Secured With: Transpore Surgical T ape, 2x10 (in/yd) 1 x Per Week/30 Days Discharge Instructions: Secure dressing with tape as directed. Com pression Wrap: Urgo K2 Lite, two layer compression system, regular 1 x Per Week/30 Days Discharge Instructions: Apply Urgo K2 Lite as directed (alternative to 3 layer compression). 10/10/2022: The wound is strongly malodorous today. There is no purulent drainage, nor any increased erythema. There is some tissue maceration around the wound, but he did not have a whole lot of drainage on his dressings. There is slough on the wound surface. I used a curette to debride slough, skin, and subcutaneous tissue from the wound. I also took a culture due to the odor. While I await the culture results, we will add topical gentamicin to the topical mupirocin we are already using. We will continue silver alginate and 3 layer compression equivalent. Once the culture data return, I will tailor antibiotic therapy appropriately. Follow-up in 1 week. Electronic Signature(s) Signed: 10/10/2022 3:59:33 PM By: [name] MD FACS Entered By: [name] on 10/10/2022 15:59:33 -------------------------------------------------------------------------------- HxROS Details Patient Name: Date of Service: NA [name] 10/10/2022 2:45 PM Medical Record Number: [id number] Patient Account Number: [id number] Date of Birth/Sex: Treating RN: [birth year] (67 y.o. M) Primary Care Provider: [name] Other Clinician: Referring Provider: Treating Provider/Extender: Tamim, Skog A ([id number]) [id number] Page 10 of 11 Weeks in Treatment: 14 Information Obtained From Patient Eyes Medical History: Positive for: Cataracts - Removed 2008 Cardiovascular Medical History: Positive for: Coronary Artery Disease; Hypertension; Myocardial Infarction; Peripheral Arterial Disease Past Medical History  Notes: Hypercholesterolemia Abnormal EKG CABG X3 2019 Gastrointestinal Medical History: Past Medical History Notes: GERD Endocrine Medical History: Positive for: Type II Diabetes Time with diabetes: 24 years Treated with: Insulin, Oral agents Blood sugar tested every day: Yes Tested : Musculoskeletal Medical History: Positive for: Osteomyelitis Past Medical History Notes: Diabetic foot ulcer Neurologic Medical History: Positive for: Neuropathy HBO Extended History Items Eyes: Cataracts Immunizations Pneumococcal Vaccine: Received Pneumococcal Vaccination: Yes Received Pneumococcal Vaccination On or After 60th Birthday: Yes Implantable Devices Yes Family and Social History Cancer: Yes - Father; Diabetes: Yes - Father,Mother,Paternal Grandparents; Heart Disease: Yes - Father; Hereditary Spherocytosis: No; Hypertension: Yes - Father; Kidney Disease: No; Lung Disease: No; Seizures: No; Stroke: No; Thyroid Problems: No; Tuberculosis: No; Never smoker; Marital Status - Married; Alcohol Use: Rarely; Drug Use: No History; Caffeine Use: Daily; Financial Concerns: No; Food, Clothing or Shelter Needs: No; Support System Lacking: No; Transportation Concerns: No Psychologist, prison and probation services) Signed: 10/10/2022 4:00:45 PM By: [name] MD FACS Entered By: [name] on 10/10/2022 15:53:22 -------------------------------------------------------------------------------- SuperBill Details Patient Name: Date of Service: NA [name] 10/10/2022 Medical Record Number: [id number] Patient Account Number: [id number] Date of Birth/Sex: Treating RN: 1956-01-10 (67 y.o. Jacques Earthly, Stephannie Peters A ([id number]) [id number] Page 11 of 11 Primary Care Provider: [name] Other Clinician: Referring Provider: Treating Provider/Extender: [name]: 14 Diagnosis Coding ICD-10 Codes  Code Description L97.322 Non-pressure chronic  ulcer of left ankle with fat layer exposed I73.9 Peripheral vascular disease, unspecified E11.622 Type 2 diabetes mellitus with other skin ulcer Facility Procedures : CPT4 Code: 16109604 Description: 11042 - DEB SUBQ TISSUE 20 SQ CM/< ICD-10 Diagnosis Description L97.322 Non-pressure chronic ulcer of left ankle with fat layer exposed Modifier: Quantity: 1 Physician Procedures : CPT4 Code Description Modifier [id number] 99214 - WC PHYS LEVEL 4 - EST PT 25 ICD-10 Diagnosis Description L97.322 Non-pressure chronic ulcer of left ankle with fat layer exposed I73.9 Peripheral vascular disease, unspecified E11.622 Type 2 diabetes  mellitus with other skin ulcer Quantity: 1 : [id number] 11042 - WC PHYS SUBQ TISS 20 SQ CM ICD-10 Diagnosis Description L97.322 Non-pressure chronic ulcer of left ankle with fat layer exposed Quantity: 1 Electronic Signature(s) Signed: 10/10/2022 3:59:50 PM By: [name] MD FACS Entered By: [name] on 10/10/2022 15:59:50

## 2022-10-11 NOTE — Progress Notes (Signed)
[name], Colin A ([id number]) [id number] Page 1 of 7 Visit Report for 10/10/2022 Arrival Information Details Patient Name: Date of Service: NA [name] 10/10/2022 2:45 PM Medical Record Number: [id number] Patient Account Number: [id number] Date of Birth/Sex: Treating RN: 30-May-1956 (67 y.o. [name], [name] Primary Care [name]: [name] Other Clinician: Referring [name]: Treating [name]/Extender: [name] in Treatment: 14 Visit Information History Since Last Visit Added or deleted any medications: No Patient Arrived: Knee Scooter Any new allergies or adverse reactions: No Arrival Time: 14:51 Had a fall or experienced change in No Accompanied By: self activities of daily living that may affect Transfer Assistance: None risk of falls: Patient Identification Verified: Yes Signs or symptoms of abuse/neglect since last visito No Secondary Verification Process Completed: Yes Hospitalized since last visit: No Patient Requires Transmission-Based Precautions: No Implantable device outside of the clinic excluding No Patient Has Alerts: No cellular tissue based products placed in the center since last visit: Has Dressing in Place as Prescribed: Yes Has Compression in Place as Prescribed: Yes Pain Present Now: No Electronic Signature(s) Signed: 10/10/2022 4:53:54 PM By: Zenaida Deed RN, BSN Entered By: Zenaida Deed on 10/10/2022 14:55:24 -------------------------------------------------------------------------------- Encounter Discharge Information Details Patient Name: Date of Service: NA Colin Nephew A. 10/10/2022 2:45 PM Medical Record Number: [id number] Patient Account Number: [id number] Date of Birth/Sex: Treating RN: 19-Mar-1956 (67 y.o. [name] Primary Care Mayling Aber: [name] Other Clinician: Referring Ianmichael [name]: Treating [name]/Extender: [name] in Treatment:  14 Encounter Discharge Information Items Post Procedure Vitals Discharge Condition: Stable Temperature (F): 98.4 Ambulatory Status: Knee Scooter Pulse (bpm): 85 Discharge Destination: Home Respiratory Rate (breaths/min): 18 Transportation: Private Auto Blood Pressure (mmHg): 166/89 Accompanied By: self Schedule Follow-up Appointment: Yes Clinical Summary of Care: Patient Declined Electronic Signature(s) Signed: 10/10/2022 4:53:54 PM By: Zenaida Deed RN, BSN Entered By: Zenaida Deed on 10/10/2022 15:49:24 -------------------------------------------------------------------------------- Lower Extremity Assessment Details Patient Name: Date of Service: NA Colin Nephew A. 10/10/2022 2:45 PM Medical Record Number: [id number] Patient Account Number: [id number] Date of Birth/Sex: Treating RN: 03-07-1956 (67 y.o. [name] Primary Care Kariyah Baugh: [name] Other Clinician: Referring Arek Spadafore: Treating Davione Lenker/Extender: [name] in Treatment: 14 Edema Assessment N[Left: [name] ([id number])] [name]: [id number] Page 2 of 7] Assessed: [Left: No] [Right: No] Edema: [Left: Ye] [Right: s] Calf Left: Right: Point of Measurement: From Medial Instep 34.5 cm Ankle Left: Right: Point of Measurement: From Medial Instep 22 cm Vascular Assessment Pulses: Dorsalis Pedis Palpable: [Left:Yes] Electronic Signature(s) Signed: 10/10/2022 4:53:54 PM By: Zenaida Deed RN, BSN Entered By: Zenaida Deed on 10/10/2022 15:02:40 -------------------------------------------------------------------------------- Multi Wound Chart Details Patient Name: Date of Service: Colin Fanning A. 10/10/2022 2:45 PM Medical Record Number: [id number] Patient Account Number: [id number] Date of Birth/Sex: Treating RN: September 21, 1955 (67 y.o. M) Primary Care Desirey Keahey: [name] Other Clinician: Referring Bryam Taborda: Treating Karson Chicas/Extender:  [name] in Treatment: 14 Vital Signs Height(in): 74 Capillary Blood Glucose(mg/dl): 87 Weight(lbs): 416 Pulse(bpm): 85 Body Mass Index(BMI): 24 Blood Pressure(mmHg): 166/89 Temperature(F): 98.4 Respiratory Rate(breaths/min): 18 [2:Photos:] [N/A:N/A] Left, Medial Ankle N/A N/A Wound Location: Pressure Injury N/A N/A Wounding Event: Diabetic Wound/Ulcer of the Lower N/A N/A Primary Etiology: Extremity Cataracts, Coronary Artery Disease, N/A N/A Comorbid History: Hypertension, Myocardial Infarction, Peripheral Arterial Disease, Type II Diabetes, Osteomyelitis, Neuropathy 06/13/2022 N/A N/A Date Acquired: 14 N/A N/A [name] of Treatment: Open N/A N/A Wound Status: No N/A N/A Wound Recurrence:  3.9x1.3x1.3 N/A N/A Measurements L x W x D (cm) 3.982 N/A N/A A (cm) : rea 5.177 N/A N/A Volume (cm) : -95.00% N/A N/A % Reduction in A rea: -2437.70% N/A N/A % Reduction in Volume: 12 Starting Position 1 (o'clock): 9 Ending Position 1 (o'clock): 1 Maximum Distance 1 (cm): Yes N/A N/A Undermining: Grade 3 N/A N/A Classification: Medium N/A N/A Exudate A mount: Purulent N/A N/A Exudate TypeABDIFATAH, [name] ([id number]) [id number] Page 3 of 7 yellow, brown, green N/A N/A Exudate Color: Yes N/A N/A Foul Odor A Cleansing: fter No N/A N/A Odor Anticipated Due to Product Use: Well defined, not attached N/A N/A Wound Margin: Medium (34-66%) N/A N/A Granulation A mount: Pink, Pale N/A N/A Granulation Quality: Medium (34-66%) N/A N/A Necrotic Amount: Fat Layer (Subcutaneous Tissue): Yes N/A N/A Exposed Structures: Bone: Yes Fascia: No Tendon: No Muscle: No Joint: No Small (1-33%) N/A N/A Epithelialization: Debridement - Excisional N/A N/A Debridement: Pre-procedure Verification/Time Out 15:20 N/A N/A Taken: Subcutaneous, Slough N/A N/A Tissue Debrided: Skin/Subcutaneous Tissue N/A N/A Level: 3.98  N/A N/A Debridement A (sq cm): rea Curette N/A N/A Instrument: Minimum N/A N/A Bleeding: Pressure N/A N/A Hemostasis A chieved: 0 N/A N/A Procedural Pain: 0 N/A N/A Post Procedural Pain: Procedure was tolerated well N/A N/A Debridement Treatment Response: 3.9x1.3x1.3 N/A N/A Post Debridement Measurements L x W x D (cm) 5.177 N/A N/A Post Debridement Volume: (cm) Callus: Yes N/A N/A Periwound Skin Texture: Maceration: Yes N/A N/A Periwound Skin Moisture: Dry/Scaly: No No Abnormalities Noted N/A N/A Periwound Skin Color: No Abnormality N/A N/A Temperature: Debridement N/A N/A Procedures Performed: Treatment Notes Wound #2 (Ankle) Wound Laterality: Left, Medial Cleanser Normal Saline Discharge Instruction: Cleanse the wound with Normal Saline prior to applying a clean dressing using gauze sponges, not tissue or cotton balls. Peri-Wound Care Zinc Oxide Ointment 30g tube Discharge Instruction: Apply Zinc Oxide to periwound with each dressing change Sween Lotion (Moisturizing lotion) Discharge Instruction: Apply moisturizing lotion as directed Topical Gentamicin Discharge Instruction: As directed by physician Mupirocin Ointment Discharge Instruction: Apply Mupirocin (Bactroban) as instructed Primary Dressing Maxorb Extra Ag+ Alginate Dressing, 2x2 (in/in) Discharge Instruction: Apply to wound bed as instructed Secondary Dressing Woven Gauze Sponge, Non-Sterile 4x4 in Discharge Instruction: Apply over primary dressing as directed. Zetuvit Plus 4x8 in Discharge Instruction: Apply over primary dressing as directed. Secured With Transpore Surgical Tape, 2x10 (in/yd) Discharge Instruction: Secure dressing with tape as directed. Compression Wrap Urgo K2 Lite, two layer compression system, regular Discharge Instruction: Apply Urgo K2 Lite as directed (alternative to 3 layer compression). Compression Stockings Add-Ons FULLER, MAKIN A ([id number])  [id number] Page 4 of 7 Electronic Signature(s) Signed: 10/10/2022 3:50:04 PM By: [name] MD FACS Entered By: [name] on 10/10/2022 15:50:04 -------------------------------------------------------------------------------- Multi-Disciplinary Care Plan Details Patient Name: Date of Service: NA Colin Nephew A. 10/10/2022 2:45 PM Medical Record Number: [id number] Patient Account Number: [id number] Date of Birth/Sex: Treating RN: 07-18-1955 (67 y.o. [name] Primary Care Laurencia Roma: [name] Other Clinician: Referring Zaela Graley: Treating Mckenna Boruff/Extender: [name] in Treatment: 14 Multidisciplinary Care Plan reviewed with physician Active Inactive Pressure Nursing Diagnoses: Knowledge deficit related to causes and risk factors for pressure ulcer development Knowledge deficit related to management of pressures ulcers Goals: Patient will remain free of pressure ulcers Date Initiated: 07/04/2022 Target Resolution Date: 10/14/2022 Goal Status: Active Interventions: Assess: immobility, friction, shearing, incontinence upon admission and as needed Assess potential for pressure ulcer upon admission and as needed Treatment Activities:  Patient referred for pressure reduction/relief devices : 07/04/2022 Notes: Wound/Skin Impairment Nursing Diagnoses: Knowledge deficit related to smoking impact on wound healing Goals: Ulcer/skin breakdown will have a volume reduction of 30% by week 4 Date Initiated: 07/04/2022 Target Resolution Date: 10/14/2022 Goal Status: Active Interventions: Assess ulceration(s) every visit Provide education on ulcer and skin care Notes: Electronic Signature(s) Signed: 10/10/2022 4:53:54 PM By: Zenaida Deed RN, BSN Entered By: Zenaida Deed on 10/10/2022 15:15:43 -------------------------------------------------------------------------------- Pain Assessment Details Patient Name: Date of  Service: Colin Fanning A. 10/10/2022 2:45 PM Medical Record Number: [id number] Patient Account Number: [id number] Date of Birth/Sex: Treating RN: Mar 23, 1956 (67 y.o. [name] Primary Care Ayari Liwanag: [name] Other Clinician: Referring Glori Machnik: Treating Zamora Colton/Extender: [name] in Treatment: [address] [place] A ([id number]) [id number] Page 5 of 7 Active Problems Location of Pain Severity and Description of Pain Patient Has Paino No Site Locations Rate the pain. Current Pain Level: 0 Pain Management and Medication Current Pain Management: Electronic Signature(s) Signed: 10/10/2022 4:53:54 PM By: Zenaida Deed RN, BSN Entered By: Zenaida Deed on 10/10/2022 14:58:21 -------------------------------------------------------------------------------- Patient/Caregiver Education Details Patient Name: Date of Service: NA [name] 4/29/2024andnbsp2:45 PM Medical Record Number: [id number] Patient Account Number: [id number] Date of Birth/Gender: Treating RN: Jun 13, 1956 (67 y.o. [name] Primary Care Physician: [name] Other Clinician: Referring Physician: Treating Physician/Extender: [name] in Treatment: 14 Education Assessment Education Provided To: Patient Education Topics Provided Infection: Methods: Explain/Verbal Responses: Reinforcements needed, State content correctly Venous: Methods: Explain/Verbal Responses: Reinforcements needed, State content correctly Wound/Skin Impairment: Methods: Explain/Verbal Responses: Reinforcements needed, State content correctly Electronic Signature(s) Signed: 10/10/2022 4:53:54 PM By: Zenaida Deed RN, BSN Entered By: Zenaida Deed on 10/10/2022 15:16:13 Wound Assessment Details -------------------------------------------------------------------------------- [name] ([id number])  [id number] Page 6 of 7 Patient Name: Date of Service: NA [name] 10/10/2022 2:45 PM Medical Record Number: [id number] Patient Account Number: [id number] Date of Birth/Sex: Treating RN: 1956/05/28 (67 y.o. [name] Primary Care Kaylamarie Swickard: [name] Other Clinician: Referring Maevis Mumby: Treating Ennis Heavner/Extender: [name] in Treatment: 14 Wound Status Wound Number: 2 Primary Diabetic Wound/Ulcer of the Lower Extremity Etiology: Wound Location: Left, Medial Ankle Wound Open Wounding Event: Pressure Injury Status: Date Acquired: 06/13/2022 Comorbid Cataracts, Coronary Artery Disease, Hypertension, Myocardial [name] Of Treatment: 14 History: Infarction, Peripheral Arterial Disease, Type II Diabetes, Clustered Wound: No Osteomyelitis, Neuropathy Photos Wound Measurements Length: (cm) 3.9 % Reduction in Area: -95% Width: (cm) 1.3 % Reduction in Volume: -2437.7% Depth: (cm) 1.3 Epithelialization: Small (1-33%) Area: (cm) 3.982 Tunneling: No Volume: (cm) 5.177 Undermining: Yes Starting Position (o'clock): 12 Ending Position (o'clock): 9 Maximum Distance: (cm) 1 Wound Description Classification: Grade 3 Foul Odor After Cleansing: Yes Wound Margin: Well defined, not attached Due to Product Use: No Exudate Amount: Medium Slough/Fibrino Yes Exudate Type: Purulent Exudate Color: yellow, brown, green Wound Bed Granulation Amount: Medium (34-66%) Exposed Structure Granulation Quality: Pink, Pale Fascia Exposed: No Necrotic Amount: Medium (34-66%) Fat Layer (Subcutaneous Tissue) Exposed: Yes Tendon Exposed: No Muscle Exposed: No Joint Exposed: No Bone Exposed: Yes Periwound Skin Texture Texture Color No Abnormalities Noted: No No Abnormalities Noted: Yes Callus: Yes Temperature / Pain Temperature: No Abnormality Moisture No Abnormalities Noted: No Dry / Scaly: No Maceration: Yes Treatment Notes Wound  #2 (Ankle) Wound Laterality: Left, Medial Cleanser Normal Saline Discharge Instruction: Cleanse the wound with Normal Saline prior to applying a clean dressing using gauze sponges, not tissue  or cotton balls. RIVER, AMBROSIO A ([id number]) [id number] Page 7 of 7 Peri-Wound Care Zinc Oxide Ointment 30g tube Discharge Instruction: Apply Zinc Oxide to periwound with each dressing change Sween Lotion (Moisturizing lotion) Discharge Instruction: Apply moisturizing lotion as directed Topical Gentamicin Discharge Instruction: As directed by physician Mupirocin Ointment Discharge Instruction: Apply Mupirocin (Bactroban) as instructed Primary Dressing Maxorb Extra Ag+ Alginate Dressing, 2x2 (in/in) Discharge Instruction: Apply to wound bed as instructed Secondary Dressing Woven Gauze Sponge, Non-Sterile 4x4 in Discharge Instruction: Apply over primary dressing as directed. Zetuvit Plus 4x8 in Discharge Instruction: Apply over primary dressing as directed. Secured With Transpore Surgical Tape, 2x10 (in/yd) Discharge Instruction: Secure dressing with tape as directed. Compression Wrap Urgo K2 Lite, two layer compression system, regular Discharge Instruction: Apply Urgo K2 Lite as directed (alternative to 3 layer compression). Compression Stockings Add-Ons Electronic Signature(s) Signed: 10/10/2022 4:53:54 PM By: Zenaida Deed RN, BSN Entered By: Zenaida Deed on 10/10/2022 15:12:58 -------------------------------------------------------------------------------- Vitals Details Patient Name: Date of Service: NA [name] 10/10/2022 2:45 PM Medical Record Number: [id number] Patient Account Number: [id number] Date of Birth/Sex: Treating RN: 12/17/1955 (67 y.o. [name] Primary Care Benna Arno: [name] Other Clinician: Referring Emersynn Deatley: Treating Ryott Rafferty/Extender: [name] in Treatment: 14 Vital Signs Time Taken:  14:57 Temperature (F): 98.4 Height (in): 74 Pulse (bpm): 85 Weight (lbs): 187 Respiratory Rate (breaths/min): 18 Body Mass Index (BMI): 24 Blood Pressure (mmHg): 166/89 Capillary Blood Glucose (mg/dl): 87 Reference Range: 80 - 120 mg / dl Notes glucose per pt report this am Electronic Signature(s) Signed: 10/10/2022 4:53:54 PM By: Zenaida Deed RN, BSN Entered By: Zenaida Deed on 10/10/2022 14:58:12

## 2022-10-17 ENCOUNTER — Encounter (HOSPITAL_BASED_OUTPATIENT_CLINIC_OR_DEPARTMENT_OTHER): Payer: BC Managed Care – PPO | Attending: General Surgery | Admitting: General Surgery

## 2022-10-17 DIAGNOSIS — E11622 Type 2 diabetes mellitus with other skin ulcer: Secondary | ICD-10-CM | POA: Insufficient documentation

## 2022-10-17 DIAGNOSIS — E1140 Type 2 diabetes mellitus with diabetic neuropathy, unspecified: Secondary | ICD-10-CM | POA: Diagnosis not present

## 2022-10-17 DIAGNOSIS — L97322 Non-pressure chronic ulcer of left ankle with fat layer exposed: Secondary | ICD-10-CM | POA: Insufficient documentation

## 2022-10-17 DIAGNOSIS — Z951 Presence of aortocoronary bypass graft: Secondary | ICD-10-CM | POA: Diagnosis not present

## 2022-10-17 DIAGNOSIS — E1151 Type 2 diabetes mellitus with diabetic peripheral angiopathy without gangrene: Secondary | ICD-10-CM | POA: Diagnosis not present

## 2022-10-17 DIAGNOSIS — L97326 Non-pressure chronic ulcer of left ankle with bone involvement without evidence of necrosis: Secondary | ICD-10-CM | POA: Diagnosis not present

## 2022-10-17 DIAGNOSIS — I2510 Atherosclerotic heart disease of native coronary artery without angina pectoris: Secondary | ICD-10-CM | POA: Diagnosis not present

## 2022-10-17 NOTE — Progress Notes (Signed)
MICHOEL, [name] [name] [id number] Page 1 of 7 Visit Report for 10/17/2022 Arrival Information Details Patient Name: Date of Service: NA [name] 10/17/2022 3:00 PM Medical Record Number: [id number] Patient Account Number: [id number] Date of Birth/Sex: Treating RN: 1955/09/15 (67 y.o. [name] Primary Care Zineb Glade: [name] Other Clinician: Referring [name]: Treating [name]/Extender: [name] in Treatment: 15 Visit Information History Since Last Visit Added or deleted any medications: No Patient Arrived: Knee Scooter Any new allergies or adverse reactions: No Arrival Time: 14:59 Had [name] fall or experienced change in No Accompanied By: self activities of daily living that may affect Transfer Assistance: None risk of falls: Patient Identification Verified: Yes Signs or symptoms of abuse/neglect since last visito No Secondary Verification Process Completed: Yes Hospitalized since last visit: No Patient Requires Transmission-Based Precautions: No Implantable device outside of the clinic excluding No Patient Has Alerts: No cellular tissue based products placed in the center since last visit: Has Dressing in Place as Prescribed: Yes Has Compression in Place as Prescribed: Yes Pain Present Now: No Electronic Signature(s) Signed: 10/17/2022 4:00:29 PM By: [name] Entered By: [name] on 10/17/2022 15:00:35 -------------------------------------------------------------------------------- Compression Therapy Details Patient Name: Date of Service: NA [name]. 10/17/2022 3:00 PM Medical Record Number: [id number] Patient Account Number: [id number] Date of Birth/Sex: Treating RN: 28-Jul-1955 (67 y.o. [name] Primary Care Kairon Shock: [name] Other Clinician: Referring [name]: Treating Brelee Renk/Extender: [name] Weeks in Treatment: 15 Compression  Therapy Performed for Wound Assessment: Wound #2 Left,Medial Ankle Performed By: Clinician Colin Bruin, RN Compression Type: Double Layer Post Procedure Diagnosis Same as Pre-procedure Electronic Signature(s) Signed: 10/17/2022 4:00:29 PM By: [name] By: [name] on 10/17/2022 15:42:54 -------------------------------------------------------------------------------- Encounter Discharge Information Details Patient Name: Date of Service: NA [name]. 10/17/2022 3:00 PM Medical Record Number: [id number] Patient Account Number: [id number] Date of Birth/Sex: Treating RN: 1955/11/16 (67 y.o. [name] Primary Care [name]: [name] Other Clinician: Referring [name]: Treating [name]/Extender: [name] in Treatment: 15 Encounter Discharge Information Items Post Procedure Vitals Discharge Condition: Stable Temperature (F): 98.3 Ambulatory Status: Knee Scooter Pulse (bpm): 80 Discharge Destination: Home Respiratory Rate (breaths/min): 16 Transportation: Private Auto Blood Pressure (mmHg): 136/75 AMAAD, [name] [name] ([id number]) [id number] Page 2 of 7 Accompanied By: self Schedule Follow-up Appointment: Yes Clinical Summary of Care: Patient Declined Electronic Signature(s) Signed: 10/17/2022 4:00:29 PM By: [name] By: [name] on 10/17/2022 15:43:46 -------------------------------------------------------------------------------- Lower Extremity Assessment Details Patient Name: Date of Service: [name]. 10/17/2022 3:00 PM Medical Record Number: [id number] Patient Account Number: [id number] Date of Birth/Sex: Treating RN: 03-13-1956 (67 y.o. [name] Primary Care Awa Bachicha: [name] Other Clinician: Referring Yania Bogie: Treating [name]/Extender: [name] Weeks in Treatment: 15 Edema Assessment Assessed: [name]:  No] [Right: No] Edema: [Left: Ye] [Right: s] Calf Left: Right: Point of Measurement: From Medial Instep 33.5 cm Ankle Left: Right: Point of Measurement: From Medial Instep 21.8 cm Vascular Assessment Pulses: Dorsalis Pedis Palpable: [Left:Yes] Electronic Signature(s) Signed: 10/17/2022 4:00:29 PM By: [name] Entered By: [name] on 10/17/2022 15:06:09 -------------------------------------------------------------------------------- Multi Wound Chart Details Patient Name: Date of Service: NA [name]. 10/17/2022 3:00 PM Medical Record Number: [id number] Patient Account Number: [id number] Date of Birth/Sex: Treating RN: 02/28/1956 (67 y.o. M) Primary Care [name]: [name] Other Clinician: Referring [name]: Treating Tedra [name]/Extender: [name] Weeks in Treatment:  15 Vital Signs Height(in): 74 Pulse(bpm): 80 Weight(lbs): 187 Blood Pressure(mmHg): 136/75 Body Mass Index(BMI): 24 Temperature(F): 98.3 Respiratory Rate(breaths/min): 16 [2:Photos:] [N/[name]:N/[name]] Left, Medial Ankle N/[name]/[name] Wound Location: Pressure Injury N/[name]/[name] Wounding Event: Diabetic Wound/Ulcer of the Lower N/[name]/[name] Primary Etiology: Extremity Cataracts, Coronary Artery Disease, N/[name]/[name] Comorbid History: Hypertension, Myocardial Infarction, Peripheral Arterial Disease, Type II Diabetes, Osteomyelitis, Neuropathy 06/13/2022 N/[name]/[name] Date Acquired: 15 N/[name]/[name] Weeks of Treatment: Open N/[name]/[name] Wound Status: No N/[name]/[name] Wound Recurrence: 3.5x1x0.5 N/[name]/[name] Measurements L x W x D (cm) 2.749 N/[name]/[name] [name] (cm) : rea 1.374 N/[name]/[name] Volume (cm) : -34.60% N/[name]/[name] % Reduction in [name] rea: -573.50% N/[name]/[name] % Reduction in Volume: 4 Starting Position 1 (o'clock): 9 Ending Position 1 (o'clock): 1.5 Maximum Distance 1 (cm): Yes N/[name]/[name] Undermining: Grade 3 N/[name]/[name] Classification: Medium N/[name]/[name] Exudate [name] mount: Purulent N/[name]/[name] Exudate Type: yellow, brown,  green N/[name]/[name] Exudate Color: Yes N/[name]/[name] Foul Odor [name] Cleansing: fter No N/[name]/[name] Odor [name] nticipated Due to Product Use: Well defined, not attached N/[name]/[name] Wound Margin: Large (67-100%) N/[name]/[name] Granulation [name] mount: Pink, Pale N/[name]/[name] Granulation Quality: Small (1-33%) N/[name]/[name] Necrotic [name] mount: Fat Layer (Subcutaneous Tissue): Yes N/[name]/[name] Exposed Structures: Bone: Yes Fascia: No Tendon: No Muscle: No Joint: No Small (1-33%) N/[name]/[name] Epithelialization: Debridement - Excisional N/[name]/[name] Debridement: Pre-procedure Verification/Time Out 15:13 N/[name]/[name] Taken: Lidocaine 4% Topical Solution N/[name]/[name] Pain Control: Necrotic/Eschar, Subcutaneous, N/[name]/[name] Tissue Debrided: Slough Skin/Subcutaneous Tissue N/[name]/[name] Level: 2.75 N/[name]/[name] Debridement [name] (sq cm): rea Curette N/[name]/[name] Instrument: Minimum N/[name]/[name] Bleeding: Pressure N/[name]/[name] Hemostasis Achieved: Debridement Treatment Response: Procedure was tolerated well N/[name]/[name] Post Debridement Measurements L x 3.5x1x1 N/[name]/[name] W x D (cm) 2.749 N/[name]/[name] Post Debridement Volume: (cm) Callus: Yes N/[name]/[name] Periwound Skin Texture: Maceration: Yes N/[name]/[name] Periwound Skin Moisture: Dry/Scaly: No No Abnormalities Noted N/[name]/[name] Periwound Skin Color: No Abnormality N/[name]/[name] Temperature: Chemical Cauterization N/[name]/[name] Procedures Performed: Debridement Treatment Notes Electronic Signature(s) Signed: 10/17/2022 3:21:04 PM By: [name] MD FACS Entered By: [name] on 10/17/2022 15:21:04 -------------------------------------------------------------------------------- Multi-Disciplinary Care Plan Details Patient Name: Date of Service: NA [name]. 10/17/2022 3:00 PM Medical Record Number: [id number] Patient Account Number: [id number] Date of Birth/Sex: Treating RN: [birth year] (67 y.o. [name] Primary Care Mitchelle Sultan: [name] Other Clinician: Referring Jadi Deyarmin: Treating Araya Roel/Extender: [name] in Treatment: [address] Ahmad [name] ([id number]) [id number] Page 4 of 7 Multidisciplinary Care Plan reviewed with physician Active Inactive Pressure Nursing Diagnoses: Knowledge deficit related to causes and risk factors for pressure ulcer development Knowledge deficit related to management of pressures ulcers Goals: Patient will remain free of pressure ulcers Date Initiated: 07/04/2022 Target Resolution Date: 12/09/2022 Goal Status: Active Interventions: Assess: immobility, friction, shearing, incontinence upon admission and as needed Assess potential for pressure ulcer upon admission and as needed Treatment Activities: Patient referred for pressure reduction/relief devices : 07/04/2022 Notes: Electronic Signature(s) Signed: 10/17/2022 4:00:29 PM By: [name] Entered By: [name] on 10/17/2022 15:17:16 -------------------------------------------------------------------------------- Pain Assessment Details Patient Name: Date of Service: NA [name]. 10/17/2022 3:00 PM Medical Record Number: [id number] Patient Account Number: [id number] Date of Birth/Sex: Treating RN: 06-06-1956 (67 y.o. [name] Primary Care Sourish Allender: [name] Other Clinician: Referring Nataly Pacifico: Treating Esaw Knippel/Extender: [name] Weeks in Treatment: 15 Active Problems Location of Pain  Severity and Description of Pain Patient Has Paino No Site Locations Rate the pain. Current Pain Level: 0 Pain Management and Medication Current Pain Management: Electronic Signature(s) Signed: 10/17/2022 4:00:29 PM By: [name] Entered By: [name] on 10/17/2022 15:00:55 [name] [id number] Page 5 of 7 -------------------------------------------------------------------------------- Patient/Caregiver Education Details Patient Name: Date of Service: NA  [name] 5/6/2024andnbsp3:00 PM Medical Record Number: [id number] Patient Account Number: [id number] Date of Birth/Gender: Treating RN: June 17, 1955 (67 y.o. [name] Primary Care Physician: [name] Other Clinician: Referring Physician: Treating Physician/Extender: [name] in Treatment: 15 Education Assessment Education Provided To: Patient Education Topics Provided Wound/Skin Impairment: Methods: Explain/Verbal Responses: Reinforcements needed, State content correctly Electronic Signature(s) Signed: 10/17/2022 4:00:29 PM By: [name] Entered By: [name] on 10/17/2022 15:42:29 -------------------------------------------------------------------------------- Wound Assessment Details Patient Name: Date of Service: NA [name]. 10/17/2022 3:00 PM Medical Record Number: [id number] Patient Account Number: [id number] Date of Birth/Sex: Treating RN: 04/28/1956 (67 y.o. [name] Primary Care Reiley Keisler: [name] Other Clinician: Referring Arliss Frisina: Treating Jovon Streetman/Extender: [name] Weeks in Treatment: 15 Wound Status Wound Number: 2 Primary Diabetic Wound/Ulcer of the Lower Extremity Etiology: Wound Location: Left, Medial Ankle Wound Open Wounding Event: Pressure Injury Status: Date Acquired: 06/13/2022 Comorbid Cataracts, Coronary Artery Disease, Hypertension, Myocardial Weeks Of Treatment: 15 History: Infarction, Peripheral Arterial Disease, Type II Diabetes, Clustered Wound: No Osteomyelitis, Neuropathy Photos Wound Measurements Length: (cm) 3.5 Width: (cm) 1 Depth: (cm) 0.5 Area: (cm) 2.749 Volume: (cm) 1.374 % Reduction in Area: -34.6% % Reduction in Volume: -573.5% Epithelialization: Small (1-33%) Tunneling: No Undermining: Yes Starting Position (o'clock): 4 Ending Position (o'clock): 9 Maximum Distance: (cm) 1.5 Wound Description [name], [name]  ([id number]) Classification: Grade 3 Wound Margin: Well defined, not attached Exudate Amount: Medium Exudate Type: Purulent Exudate Color: yellow, brown, green [id number] Page 6 of 7 Foul Odor After Cleansing: Yes Due to Product Use: No Slough/Fibrino Yes Wound Bed Granulation Amount: Large (67-100%) Exposed Structure Granulation Quality: Pink, Pale Fascia Exposed: No Necrotic Amount: Small (1-33%) Fat Layer (Subcutaneous Tissue) Exposed: Yes Necrotic Quality: Adherent Slough Tendon Exposed: No Muscle Exposed: No Joint Exposed: No Bone Exposed: Yes Periwound Skin Texture Texture Color No Abnormalities Noted: No No Abnormalities Noted: Yes Callus: Yes Temperature / Pain Temperature: No Abnormality Moisture No Abnormalities Noted: No Dry / Scaly: No Maceration: Yes Treatment Notes Wound #2 (Ankle) Wound Laterality: Left, Medial Cleanser Normal Saline Discharge Instruction: Cleanse the wound with Normal Saline prior to applying [name] clean dressing using gauze sponges, not tissue or cotton balls. Peri-Wound Care Zinc Oxide Ointment 30g tube Discharge Instruction: Apply Zinc Oxide to periwound with each dressing change Sween Lotion (Moisturizing lotion) Discharge Instruction: Apply moisturizing lotion as directed Topical Mupirocin Ointment Discharge Instruction: Apply Mupirocin (Bactroban) as instructed Primary Dressing Hydrofera Blue Ready Transfer Foam, 2.5x2.5 (in/in) Discharge Instruction: Apply directly to wound bed as directed Secondary Dressing Woven Gauze Sponge, Non-Sterile 4x4 in Discharge Instruction: Apply over primary dressing as directed. Zetuvit Plus 4x8 in Discharge Instruction: Apply over primary dressing as directed. Secured With Transpore Surgical Tape, 2x10 (in/yd) Discharge Instruction: Secure dressing with tape as directed. Compression Wrap Urgo K2 Lite, two layer compression system, regular Discharge Instruction: Apply  Urgo K2 Lite as directed (alternative to 3 layer compression). Compression Stockings Add-Ons Electronic Signature(s) Signed: 10/17/2022 4:00:29 PM By: [name] Entered By: [name] on 10/17/2022 15:09:22 Vitals Details -------------------------------------------------------------------------------- [name] ([id number]) [id number] Page 7  of 7 Patient Name: Date of Service: [name]. 10/17/2022 3:00 PM Medical Record Number: [id number] Patient Account Number: [id number] Date of Birth/Sex: Treating RN: January 22, 1956 (67 y.o. [name] Primary Care Sadiya Durand: [name] Other Clinician: Referring Taraoluwa Thakur: Treating Jayde Mcallister/Extender: [name] Weeks in Treatment: 15 Vital Signs Time Taken: 15:00 Temperature (F): 98.3 Height (in): 74 Pulse (bpm): 80 Weight (lbs): 187 Respiratory Rate (breaths/min): 16 Body Mass Index (BMI): 24 Blood Pressure (mmHg): 136/75 Reference Range: 80 - 120 mg / dl Electronic Signature(s) Signed: 10/17/2022 4:00:29 PM By: [name] Entered By: [name] on 10/17/2022 15:00:49

## 2022-10-17 NOTE — Progress Notes (Signed)
SLATEN, KILDOW A ([id number]) [id number] Page 1 of 12 Visit Report for 10/17/2022 Chief Complaint Document Details Patient Name: Date of Service: NA [name] 10/17/2022 3:00 PM Medical Record Number: [id number] Patient Account Number: [id number] Date of Birth/Sex: Treating RN: 01-09-1956 (67 y.o. M) Primary Care Provider: [name] Other Clinician: Referring Provider: Treating Provider/Extender: [name] in Treatment: 15 Information Obtained from: Patient Chief Complaint Patients presents for treatment of an open diabetic ulcer with exposed bone and osteomyelitis 07/04/2022: Diabetic ankle ulcer Electronic Signature(s) Signed: 10/17/2022 3:21:14 PM By: [name] MD FACS Entered By: [name] on 10/17/2022 15:21:13 -------------------------------------------------------------------------------- Debridement Details Patient Name: Date of Service: NA Colin Nephew A. 10/17/2022 3:00 PM Medical Record Number: [id number] Patient Account Number: [id number] Date of Birth/Sex: Treating RN: 12-22-1955 (67 y.o. [name] Primary Care Provider: [name] Other Clinician: Referring Provider: Treating Provider/Extender: [name]: 15 Debridement Performed for Assessment: Wound #2 Left,Medial Ankle Performed By: Physician [name], MD Debridement Type: Debridement Severity of Tissue Pre Debridement: Bone involvement without necrosis Level of Consciousness (Pre-procedure): Awake and Alert Pre-procedure Verification/Time Out Yes - 15:13 Taken: Start Time: 15:13 Pain Control: Lidocaine 4% Topical Solution Percent of Wound Bed Debrided: 100% T Area Debrided (cm): otal 2.75 Tissue and other material debrided: Non-Viable, Eschar, Slough, Subcutaneous, Slough Level: Skin/Subcutaneous Tissue Debridement Description: Excisional Instrument: Curette Bleeding:  Minimum Hemostasis Achieved: Pressure Response to Treatment: Procedure was tolerated well Level of Consciousness (Post- Awake and Alert procedure): Post Debridement Measurements of Total Wound Length: (cm) 3.5 Width: (cm) 1 Depth: (cm) 1 Volume: (cm) 2.749 Character of Wound/Ulcer Post Debridement: Improved Severity of Tissue Post Debridement: Bone involvement without necrosis Post Procedure Diagnosis Same as Pre-procedure Notes scribed for Dr. Lady Gary by Samuella Bruin, RN Electronic Signature(s) Signed: 10/17/2022 3:49:45 PM By: [name] MD FACS Lasandra Beech A ([id number]) [id number] Page 2 of 12 Signed: 10/17/2022 4:00:29 PM By: Gelene Mink By: Samuella Bruin on 10/17/2022 15:16:49 -------------------------------------------------------------------------------- HPI Details Patient Name: Date of Service: NA [name], [name] 10/17/2022 3:00 PM Medical Record Number: [id number] Patient Account Number: [id number] Date of Birth/Sex: Treating RN: 1955/11/07 (67 y.o. M) Primary Care Provider: [name] Other Clinician: Referring Provider: Treating Provider/Extender: [name] in Treatment: 15 History of Present Illness HPI Description: ADMISSION 08/25/2021 This is a 67 year old man who initially presented to his primary care provider in September 2022 with pain in his left foot. He was sent for an x-ray and while the x-ray was being performed, the tech pointed out a wound on his foot that the patient was not aware existed. He does have type 2 diabetes with significant neuropathy. His diabetes is suboptimally controlled with his most recent A1c being 8.5. He also has a history of coronary artery disease status post three- vessel CABG. he was initially seen by orthopedics, but they referred him to [facility]. He has undergone at least 7 operations/debridements and several applications of  skin substitute under the care of podiatry. He has been in a wound VAC for much of this time. His most recent procedure was July 28, 2021. A portion of the talus was biopsied and was found to be consistent with osteomyelitis. Culture also returned positive for corynebacterium. He was seen on August 16, 2021 by infectious disease. A PICC line has been placed and he will be receiving a 6-week course of IV daptomycin and cefepime. In October  2022, he underwent lower extremity vascular studies. Results are copied here: Right: Resting right ankle-brachial index is within normal range. No evidence of significant right lower extremity arterial disease. The right toe-brachial index is abnormal. Left: Resting left ankle-brachial index indicates mild left lower extremity arterial disease. The left toe-brachial index is abnormal. He has not been seen by vascular surgery despite these findings. He presented to clinic today in a cam boot and is using a knee scooter to offload. Wound VAC was in place. Once this was removed, a large ulcer was identified on the left midfoot/ankle. Bone is frankly exposed. There is no malodorous or purulent drainage. There is some granulation tissue over the central portion of the exposed bone. There is a tunnel that extends posteriorly for roughly 10 cm. It has been discussed with him by multiple providers that he is at very high risk of losing his lower leg because of this wound. He is extremely eager to avoid this outcome and is here today to review his options as well as receive ongoing wound care. 09/03/2021: Here for reevaluation of his wound. There does not appear to have been any substantial improvement overall since our last visit. He has been in a wound VAC with white foam overlying the exposed bone. We are working on getting him approved for hyperbaric oxygen therapy. 09/10/2021: We are in the process of getting him cleared to begin hyperbaric oxygen therapy. He still  needs to obtain a chest x-ray. Although the wound measurements are roughly the same, I think the overall appearance of the wound is better. The exposed bone has a bit more granulation tissue covering it. He has not received a vascular surgery appointment to reevaluate his flow to the wound. 09/17/2021: He has been approved for hyperbaric oxygen therapy and completed his chest x-ray, which I reviewed and it appears normal. The tunnels at the 12 and 10:00 positions are smaller. There is more granulation tissue covering the exposed bone and the undermining has decreased. He still has not received a vascular surgery appointment. 09/24/2021: He initiated hyperbaric oxygen therapy this week and is tolerating it well. He has an appointment with vascular surgery coming up on May 16. The granulation tissue is covering more of the exposed bone and both tunnels are a bit smaller. 10/01/2021: He continues to tolerate hyperbaric oxygen therapy. He saw infectious disease and they are planning to pull his PICC line. He has been initiated on oral antibiotics (doxycycline and Augmentin). The wound looks about the same but the tunnels are a little bit smaller. The skin seems to be contracting somewhat around the exposed bone. 10/08/2021: The wound is still about the same size, but the tunnels continue to come in and the skin is contracting around the exposed bone. He continues to have some accumulation of necrotic material in the inferoposterior aspect of the wound as well as accumulation at the 12:00 tunnel area. 10/15/2021: The wound is smaller today. The tunnels continue to come in. There is less necrotic tissue present. He does have some periwound maceration. 10/22/2021: The wound is about the same size. There is a little bit less undermining at the distal portion. The exposed bone is dark and I am not sure if this is staining from silver nitrate or his VAC sponge or if it represents necrosis. The tunnels are shallower but  he does have some serous drainage coming from the 10:00 tunnel. He continues to tolerate hyperbaric oxygen therapy well. 10/29/2021: The undermining continues to improve. The  tunnels are about the same. He has good granulation tissue overlying the majority of the exposed bone. It does appear that perhaps the tubing from his wound VAC has been eroding the skin at the 12 clock position. He continues to accumulate senescent epithelium around the borders of the wound. 11/05/2021: The undermining is almost completely resolved. The tunnels have contracted fairly significantly. No significant slough or debris accumulation. There is still senescent epithelium accumulation around the borders of the wound. He has been tolerating hyperbaric oxygen therapy well. 11/12/2021: Despite the measurements of the wound being about the same, the wound has changed in its shape and overall, I think it is improved. The undermining has resolved and the tunnels continue to shorten. There is good granulation tissue encroaching over the small area of bone that has remained exposed at the 12 o'clock position. Minimal slough accumulation. He continues to tolerate hyperbaric oxygen therapy well. 11/19/2021: I took a PCR culture last week. There was overgrowth of yeast. He is already taking suppressive doxycycline and Augmentin. I added fluconazole to his regimen. The wound is smaller again today. The tunnels continue to shorten. He continues to do well with hyperbaric oxygen therapy. 11/26/2021: For some reason, his foot has become macerated. The wound is narrower but about the same dimensions in its longitudinal aspect. The tunnels ROCCI, SIRCY A ([id number]) [id number] Page 3 of 12 continue to shorten. He has some slough buildup on the wound as well as some heaped up senescent epithelium around the perimeter. 12/03/2021: No further maceration of his foot has occurred. The wound has contracted quite  significantly from last week. The tunnel at 10:00 is closed. The tunnel at 12:00 is down to just a couple of millimeters. No other undermining is present. There is soft tissue coverage of the previously exposed bone. There is just a bit of slough and biofilm on the wound surface. 12/10/2021: The wound is looking good. It turns out the tunnel at 12:00 is only exposed when the patient dorsiflexes his foot. It is about 2 cm in depth when he does this; when his foot is in plantarflexion, the tunnel is closed. The bone that was visible at the 12:00 tunnel is completely covered with granulation tissue, but there does feel like some exposed bone deeper into the tunnel area. There is senescent skin heaped up around the periphery. Minimal slough on the wound surface. 12/16/2021: The wound dimensions are roughly the same. The surface has nice granulation tissue. The exposed bone at the 12:00 tunnel continues to be covered with more soft tissue. 7/14; patient's wound measures smaller today. Using the wound VAC with underlying collagen. He is also being treated with HBO for underlying osteomyelitis. He tells me he is on doxycycline and ampicillin follows with infectious disease next week 12/31/2021: The wound continues to contract. Unfortunately, the area where the track pad and tubing have been rubbing continues to look like it is applying friction. He says that the home health nurses that have been applying the [facility] have been putting gauze underneath the tubing, but nonetheless there is ongoing tissue breakdown at this site. Light slough accumulation on the wound surface. The tunnel continues to contract. He is tolerating HBO without difficulty. 01/07/2022: Bridging the wound VAC away from the ankle has resulted in significant improvement in the tissue at the apex of the wound. The tunnel is still present and is not all that much shorter, but the overall wound surface is very robust and healthy looking. Minimal  slough accumulation.  No concern for acute infection. 01/21/2022: The wound continues to contract and has a robust granulation tissue surface. The tunnel has come in considerably and is down to about 1.4 cm. There is still bone exposed within the tunnel but the rest of it is well covered. There is some senescent epithelium at the wound margins and a little bit of slough on the surface. 01/28/2022: No significant change in the wound this week, but there has not been any reaccumulation of senescent epithelium or slough. The tunnel is perhaps a millimeter less in depth. He has been approved for Apligraf and we will apply this today. 02/04/2022: The wound has contracted somewhat and the tunnel has filled in completely. The wound surface is clean. He is here for Apligraf #2. 02/11/2022: The wound has contracted further and is now nearly flush with the surrounding skin surface. Light layer of slough. Apligraf #3 plan for today. 02/18/2022: There is a band of epithelium trying to cut across the superior portion of the wound. There is robust granulation tissue with just a light layer of slough and biofilm on the surface. There was a little bit of greenish drainage in the wound VAC but none appreciated on the site itself. He is here for Apligraf #4. 03/04/2022: The wound has contracted considerably. There is good granulation tissue on the surface. Minimal biofilm. He is here for Apligraf #5. 03/18/2022: The wound has epithelialized to the point that it has been divided into 2 areas. Both areas are smaller in total than at his previous visit. There is good granulation tissue present with just a little bit of periwound eschar and surface slough. 10/13; left medial foot. The patient has completed treatment with hyperbaric oxygen for underlying osteomyelitis he has had a nice response in the wound. Noted today that he still had some greenish drainage. Previously he has been treated with topical gentamicin but apparently  that was stopped 2 weeks ago. Fortunately the wound is measuring smaller 10/20; wound looks better no hypergranulation minimal drainage. Granulation tissue looks healthy using gentamicin Hydrofera Blue under compression 04/08/2022: The wound continues to contract tremendously. The response to his treatment for hypertrophic granulation tissue has been quite remarkable. There are just a couple of open areas remaining. 04/18/2022: His wound is healed. READMISSION 07/04/2022: He returns to clinic with a new ulcer adjacent to where his previous large wound had been. He says that he was wearing boots and using a small padded dressing to protect the freshly healed ankle wound. Apparently the dressing rolled up and rubbed on his skin, causing a new wound. He contacted our office last week concerned about the appearance. There is a small oval wound on his left medial ankle. It is fairly clean with some periwound dry skin and eschar. 07/11/2022: The wound is about the same size this week. There is a layer of rubbery slough on the surface. It has a fairly strong odor. 07/18/2022: The wound measured larger this week due to small satellite areas opening in a fan pattern distal to the primary wound. He continues to accumulate slough on the wound surface. The culture that I took last week only grew out low levels of skin flora. 07/25/2022: The satellite areas that had opened last week have closed. The main wound is smaller with some slough accumulation. 08/01/2022: The wound continues to contract. There is a layer of slough on the surface. 08/08/2022: The wound is a little bit smaller again this week. Still with slough buildup. 3/18; patient presents for  follow-up. He has been doing dressing changes on his own with silver alginate and antibiotic ointment. He has been at [employer] over the past 3 weeks. He has developed a new wound. 09/05/2022: I am not sure if the new wound that was reported last week is just the  satellite area that had closed a couple of weeks ago and reopened. Regardless, both wounds are quite small with good epithelialization. There is some eschar buildup around the periphery of both wounds with slough on both wound surfaces. 09/12/2022: The wounds have converged. There is some increased in depth at the distal portion. This seems to be secondary to moisture accumulation as the periwound skin is a bit macerated. 09/26/2022: The wound has deteriorated further. There is increased depth at the distal portion and the wound probes to bone. The patient theorizes that the boot he has been wearing is rubbing in this spot and upon inspection of the boot, there is actually a hard plastic boot in exactly this location, supporting his theory. 10/05/2022: The wound looks a little bit better this week. The area that probed to bone now has some tissue coverage. There is still some slough buildup on the wound surfaces. 10/10/2022: The wound is strongly malodorous today. There is no purulent drainage, nor any increased erythema. There is some tissue maceration around the wound, but he did not have a whole lot of drainage on his dressings. There is slough on the wound surface. 10/17/2022: The wound still has a fairly strong odor and the drainage has increased. It is goopy and purulent. The culture that I took last week returned late on CLEAVEN, REUTHER A ([id number]) [id number] Page 4 of 12 Thursday with methicillin and tetracycline resistant staph epidermidis and multiple other staph species at fairly high concentrations. Electronic Signature(s) Signed: 10/17/2022 3:22:25 PM By: [name] MD FACS Entered By: [name] on 10/17/2022 15:22:25 -------------------------------------------------------------------------------- Chemical Cauterization Details Patient Name: Date of Service: NA [name] 10/17/2022 3:00 PM Medical Record Number: [id number] Patient Account  Number: [id number] Date of Birth/Sex: Treating RN: 1955-06-25 (67 y.o. [name] Primary Care Provider: [name] Other Clinician: Referring Provider: Treating Provider/Extender: [name]: 15 Procedure Performed for: Wound #2 Left,Medial Ankle Performed By: Physician [name], MD Post Procedure Diagnosis Same as Pre-procedure Notes scribed for Dr. Lady Gary by Samuella Bruin, RN Electronic Signature(s) Signed: 10/17/2022 3:49:45 PM By: [name] MD FACS Signed: 10/17/2022 4:00:29 PM By: Gelene Mink By: Samuella Bruin on 10/17/2022 15:17:08 -------------------------------------------------------------------------------- Physical Exam Details Patient Name: Date of Service: NA Charlyne Mom, [name] 10/17/2022 3:00 PM Medical Record Number: [id number] Patient Account Number: [id number] Date of Birth/Sex: Treating RN: 12/19/1955 (67 y.o. M) Primary Care Provider: [name] Other Clinician: Referring Provider: Treating Provider/Extender: [name]: 15 Constitutional . . . . no acute distress. Respiratory Normal work of breathing on room air. Notes 10/17/2022: The wound still has a fairly strong odor and the drainage has increased. It is goopy and purulent. There is hypertrophic granulation tissue in the more distal portion of the wound with slough and nonviable subcutaneous tissue present. With his foot at a specific angle, bone can be probed. Electronic Signature(s) Signed: 10/17/2022 3:26:18 PM By: [name] MD FACS Previous Signature: 10/17/2022 3:24:55 PM Version By: [name] MD FACS Entered By: [name] on 10/17/2022 15:26:18 -------------------------------------------------------------------------------- Physician Orders Details Patient Name: Date of Service: [name], [name] 10/17/2022 3:00  PM Medical Record Number: [id number] Patient Account  Number: [id number] Date of Birth/Sex: Treating RN: 02-24-1956 (67 y.o. [name] Primary Care Provider: [name] Other Clinician: Referring Provider: Treating Provider/Extender: [name] in Treatment: 15 Verbal / Phone Orders: No JUSTINLEE, PITRE A ([id number]) [id number] Page 5 of 12 Diagnosis Coding ICD-10 Coding Code Description [phone] Non-pressure chronic ulcer of left ankle with fat layer exposed I73.9 Peripheral vascular disease, unspecified E11.622 Type 2 diabetes mellitus with other skin ulcer Follow-up Appointments ppointment in 1 week. - Dr. Lady Gary - room 2 Return A Anesthetic (In clinic) Topical Lidocaine 4% applied to wound bed Bathing/ Shower/ Hygiene May shower with protection but do not get wound dressing(s) wet. Protect dressing(s) with water repellant cover (for example, large plastic bag) or a cast cover and may then take shower. Edema Control - Lymphedema / SCD / Other Elevate legs to the level of the heart or above for 30 minutes daily and/or when sitting for 3-4 times a day throughout the day. Avoid standing for long periods of time. Off-Loading Open toe surgical shoe to: - left foot Other: - knee scooter to left leg, stop wearing your orthoboot Wound Treatment Wound #2 - Ankle Wound Laterality: Left, Medial Cleanser: Normal Saline 1 x Per Week/30 Days Discharge Instructions: Cleanse the wound with Normal Saline prior to applying a clean dressing using gauze sponges, not tissue or cotton balls. Peri-Wound Care: Zinc Oxide Ointment 30g tube 1 x Per Week/30 Days Discharge Instructions: Apply Zinc Oxide to periwound with each dressing change Peri-Wound Care: Sween Lotion (Moisturizing lotion) 1 x Per Week/30 Days Discharge Instructions: Apply moisturizing lotion as directed Topical: Mupirocin Ointment 1 x Per Week/30 Days Discharge Instructions: Apply Mupirocin (Bactroban) as instructed Prim  Dressing: Hydrofera Blue Ready Transfer Foam, 2.5x2.5 (in/in) 1 x Per Week/30 Days ary Discharge Instructions: Apply directly to wound bed as directed Secondary Dressing: Woven Gauze Sponge, Non-Sterile 4x4 in 1 x Per Week/30 Days Discharge Instructions: Apply over primary dressing as directed. Secondary Dressing: Zetuvit Plus 4x8 in (Generic) 1 x Per Week/30 Days Discharge Instructions: Apply over primary dressing as directed. Secured With: Transpore Surgical Tape, 2x10 (in/yd) 1 x Per Week/30 Days Discharge Instructions: Secure dressing with tape as directed. Compression Wrap: Urgo K2 Lite, two layer compression system, regular 1 x Per Week/30 Days Discharge Instructions: Apply Urgo K2 Lite as directed (alternative to 3 layer compression). Patient Medications llergies: codeine A Notifications Medication Indication Start End 10/17/2022 lidocaine DOSE topical 4 % cream - cream topical 10/17/2022 Bactrim DS DOSE oral 800 mg-160 mg tablet - 1 tab PO BID x 10 days Electronic Signature(s) Signed: 10/17/2022 3:27:55 PM By: [name] MD FACS Entered By: [name] on 10/17/2022 15:27:54 Lasandra Beech A ([id number]) [id number] Page 6 of 12 -------------------------------------------------------------------------------- Problem List Details Patient Name: Date of Service: [name] A. 10/17/2022 3:00 PM Medical Record Number: [id number] Patient Account Number: [id number] Date of Birth/Sex: Treating RN: [birth year] (67 y.o. M) Primary Care Provider: [name] Other Clinician: Referring Provider: Treating Provider/Extender: [name]: 15 Active Problems ICD-10 Encounter Code Description Active Date MDM Diagnosis L97.322 Non-pressure chronic ulcer of left ankle with fat layer exposed 07/04/2022 No Yes I73.9 Peripheral vascular disease, unspecified 07/04/2022 No Yes E11.622 Type 2 diabetes mellitus with other  skin ulcer 07/04/2022 No Yes Inactive Problems Resolved Problems Electronic Signature(s) Signed: 10/17/2022 3:20:22 PM By: [name] MD FACS Entered By: [name] on 10/17/2022 15:20:22 -------------------------------------------------------------------------------- Progress  Note Details Patient Name: Date of Service: [name] A. 10/17/2022 3:00 PM Medical Record Number: [id number] Patient Account Number: [id number] Date of Birth/Sex: Treating RN: 09-04-1955 (67 y.o. M) Primary Care Provider: [name] Other Clinician: Referring Provider: Treating Provider/Extender: [name] in Treatment: 15 Subjective Chief Complaint Information obtained from Patient Patients presents for treatment of an open diabetic ulcer with exposed bone and osteomyelitis 07/04/2022: Diabetic ankle ulcer History of Present Illness (HPI) ADMISSION 08/25/2021 This is a 67 year old man who initially presented to his primary care provider in September 2022 with pain in his left foot. He was sent for an x-ray and while the x-ray was being performed, the tech pointed out a wound on his foot that the patient was not aware existed. He does have type 2 diabetes with significant neuropathy. His diabetes is suboptimally controlled with his most recent A1c being 8.5. He also has a history of coronary artery disease status post three- vessel CABG. he was initially seen by orthopedics, but they referred him to [facility]. He has undergone at least 7 operations/debridements and several applications of skin substitute under the care of podiatry. He has been in a wound VAC for much of this time. His most recent procedure was July 28, 2021. A portion of the talus was biopsied and was found to be consistent with osteomyelitis. Culture also returned positive for corynebacterium. He was seen on August 16, 2021 by infectious disease. A PICC line has been placed and he will be  receiving a 6-week course of IV daptomycin and cefepime. In October 2022, he underwent lower extremity vascular studies. Results are copied here: Right: Resting right ankle-brachial index is within normal range. No evidence of significant right lower extremity arterial disease. The right toe-brachial index is abnormal. Left: Resting left ankle-brachial index indicates mild left lower extremity arterial disease. The left toe-brachial index is abnormal. He has not been seen by vascular surgery despite these findings. He presented to clinic today in a cam boot and is using a knee scooter to offload. Wound VAC was in place. Once this was removed, a large ulcer was identified on the left midfoot/ankle. Bone is frankly exposed. There is no malodorous or purulent drainage. There is some granulation tissue over the central portion of the exposed bone. There is a tunnel that extends posteriorly for roughly 10 cm. KOLBEN, DEFREESE A ([id number]) [id number] Page 7 of 12 It has been discussed with him by multiple providers that he is at very high risk of losing his lower leg because of this wound. He is extremely eager to avoid this outcome and is here today to review his options as well as receive ongoing wound care. 09/03/2021: Here for reevaluation of his wound. There does not appear to have been any substantial improvement overall since our last visit. He has been in a wound VAC with white foam overlying the exposed bone. We are working on getting him approved for hyperbaric oxygen therapy. 09/10/2021: We are in the process of getting him cleared to begin hyperbaric oxygen therapy. He still needs to obtain a chest x-ray. Although the wound measurements are roughly the same, I think the overall appearance of the wound is better. The exposed bone has a bit more granulation tissue covering it. He has not received a vascular surgery appointment to reevaluate his flow to the  wound. 09/17/2021: He has been approved for hyperbaric oxygen therapy and completed his chest x-ray, which I reviewed  and it appears normal. The tunnels at the 12 and 10:00 positions are smaller. There is more granulation tissue covering the exposed bone and the undermining has decreased. He still has not received a vascular surgery appointment. 09/24/2021: He initiated hyperbaric oxygen therapy this week and is tolerating it well. He has an appointment with vascular surgery coming up on May 16. The granulation tissue is covering more of the exposed bone and both tunnels are a bit smaller. 10/01/2021: He continues to tolerate hyperbaric oxygen therapy. He saw infectious disease and they are planning to pull his PICC line. He has been initiated on oral antibiotics (doxycycline and Augmentin). The wound looks about the same but the tunnels are a little bit smaller. The skin seems to be contracting somewhat around the exposed bone. 10/08/2021: The wound is still about the same size, but the tunnels continue to come in and the skin is contracting around the exposed bone. He continues to have some accumulation of necrotic material in the inferoposterior aspect of the wound as well as accumulation at the 12:00 tunnel area. 10/15/2021: The wound is smaller today. The tunnels continue to come in. There is less necrotic tissue present. He does have some periwound maceration. 10/22/2021: The wound is about the same size. There is a little bit less undermining at the distal portion. The exposed bone is dark and I am not sure if this is staining from silver nitrate or his VAC sponge or if it represents necrosis. The tunnels are shallower but he does have some serous drainage coming from the 10:00 tunnel. He continues to tolerate hyperbaric oxygen therapy well. 10/29/2021: The undermining continues to improve. The tunnels are about the same. He has good granulation tissue overlying the majority of the exposed bone. It  does appear that perhaps the tubing from his wound VAC has been eroding the skin at the 12 clock position. He continues to accumulate senescent epithelium around the borders of the wound. 11/05/2021: The undermining is almost completely resolved. The tunnels have contracted fairly significantly. No significant slough or debris accumulation. There is still senescent epithelium accumulation around the borders of the wound. He has been tolerating hyperbaric oxygen therapy well. 11/12/2021: Despite the measurements of the wound being about the same, the wound has changed in its shape and overall, I think it is improved. The undermining has resolved and the tunnels continue to shorten. There is good granulation tissue encroaching over the small area of bone that has remained exposed at the 12 o'clock position. Minimal slough accumulation. He continues to tolerate hyperbaric oxygen therapy well. 11/19/2021: I took a PCR culture last week. There was overgrowth of yeast. He is already taking suppressive doxycycline and Augmentin. I added fluconazole to his regimen. The wound is smaller again today. The tunnels continue to shorten. He continues to do well with hyperbaric oxygen therapy. 11/26/2021: For some reason, his foot has become macerated. The wound is narrower but about the same dimensions in its longitudinal aspect. The tunnels continue to shorten. He has some slough buildup on the wound as well as some heaped up senescent epithelium around the perimeter. 12/03/2021: No further maceration of his foot has occurred. The wound has contracted quite significantly from last week. The tunnel at 10:00 is closed. The tunnel at 12:00 is down to just a couple of millimeters. No other undermining is present. There is soft tissue coverage of the previously exposed bone. There is just a bit of slough and biofilm on the wound surface. 12/10/2021:  The wound is looking good. It turns out the tunnel at 12:00 is only exposed when  the patient dorsiflexes his foot. It is about 2 cm in depth when he does this; when his foot is in plantarflexion, the tunnel is closed. The bone that was visible at the 12:00 tunnel is completely covered with granulation tissue, but there does feel like some exposed bone deeper into the tunnel area. There is senescent skin heaped up around the periphery. Minimal slough on the wound surface. 12/16/2021: The wound dimensions are roughly the same. The surface has nice granulation tissue. The exposed bone at the 12:00 tunnel continues to be covered with more soft tissue. 7/14; patient's wound measures smaller today. Using the wound VAC with underlying collagen. He is also being treated with HBO for underlying osteomyelitis. He tells me he is on doxycycline and ampicillin follows with infectious disease next week 12/31/2021: The wound continues to contract. Unfortunately, the area where the track pad and tubing have been rubbing continues to look like it is applying friction. He says that the home health nurses that have been applying the [facility] have been putting gauze underneath the tubing, but nonetheless there is ongoing tissue breakdown at this site. Light slough accumulation on the wound surface. The tunnel continues to contract. He is tolerating HBO without difficulty. 01/07/2022: Bridging the wound VAC away from the ankle has resulted in significant improvement in the tissue at the apex of the wound. The tunnel is still present and is not all that much shorter, but the overall wound surface is very robust and healthy looking. Minimal slough accumulation. No concern for acute infection. 01/21/2022: The wound continues to contract and has a robust granulation tissue surface. The tunnel has come in considerably and is down to about 1.4 cm. There is still bone exposed within the tunnel but the rest of it is well covered. There is some senescent epithelium at the wound margins and a little bit of slough on the  surface. 01/28/2022: No significant change in the wound this week, but there has not been any reaccumulation of senescent epithelium or slough. The tunnel is perhaps a millimeter less in depth. He has been approved for Apligraf and we will apply this today. 02/04/2022: The wound has contracted somewhat and the tunnel has filled in completely. The wound surface is clean. He is here for Apligraf #2. 02/11/2022: The wound has contracted further and is now nearly flush with the surrounding skin surface. Light layer of slough. Apligraf #3 plan for today. 02/18/2022: There is a band of epithelium trying to cut across the superior portion of the wound. There is robust granulation tissue with just a light layer of slough and biofilm on the surface. There was a little bit of greenish drainage in the wound VAC but none appreciated on the site itself. He is here for Apligraf #4. 03/04/2022: The wound has contracted considerably. There is good granulation tissue on the surface. Minimal biofilm. He is here for Apligraf #5. 03/18/2022: The wound has epithelialized to the point that it has been divided into 2 areas. Both areas are smaller in total than at his previous visit. There is good granulation tissue present with just a little bit of periwound eschar and surface slough. 10/13; left medial foot. The patient has completed treatment with hyperbaric oxygen for underlying osteomyelitis he has had a nice response in the wound. Noted today that he still had some greenish drainage. Previously he has been treated with topical gentamicin but  apparently that was stopped 2 weeks ago. THELMA, MUCK A ([id number]) [id number] Page 8 of 12 Fortunately the wound is measuring smaller 10/20; wound looks better no hypergranulation minimal drainage. Granulation tissue looks healthy using gentamicin Hydrofera Blue under compression 04/08/2022: The wound continues to contract tremendously. The response to his  treatment for hypertrophic granulation tissue has been quite remarkable. There are just a couple of open areas remaining. 04/18/2022: His wound is healed. READMISSION 07/04/2022: He returns to clinic with a new ulcer adjacent to where his previous large wound had been. He says that he was wearing boots and using a small padded dressing to protect the freshly healed ankle wound. Apparently the dressing rolled up and rubbed on his skin, causing a new wound. He contacted our office last week concerned about the appearance. There is a small oval wound on his left medial ankle. It is fairly clean with some periwound dry skin and eschar. 07/11/2022: The wound is about the same size this week. There is a layer of rubbery slough on the surface. It has a fairly strong odor. 07/18/2022: The wound measured larger this week due to small satellite areas opening in a fan pattern distal to the primary wound. He continues to accumulate slough on the wound surface. The culture that I took last week only grew out low levels of skin flora. 07/25/2022: The satellite areas that had opened last week have closed. The main wound is smaller with some slough accumulation. 08/01/2022: The wound continues to contract. There is a layer of slough on the surface. 08/08/2022: The wound is a little bit smaller again this week. Still with slough buildup. 3/18; patient presents for follow-up. He has been doing dressing changes on his own with silver alginate and antibiotic ointment. He has been at [employer] over the past 3 weeks. He has developed a new wound. 09/05/2022: I am not sure if the new wound that was reported last week is just the satellite area that had closed a couple of weeks ago and reopened. Regardless, both wounds are quite small with good epithelialization. There is some eschar buildup around the periphery of both wounds with slough on both wound surfaces. 09/12/2022: The wounds have converged. There is some increased in  depth at the distal portion. This seems to be secondary to moisture accumulation as the periwound skin is a bit macerated. 09/26/2022: The wound has deteriorated further. There is increased depth at the distal portion and the wound probes to bone. The patient theorizes that the boot he has been wearing is rubbing in this spot and upon inspection of the boot, there is actually a hard plastic boot in exactly this location, supporting his theory. 10/05/2022: The wound looks a little bit better this week. The area that probed to bone now has some tissue coverage. There is still some slough buildup on the wound surfaces. 10/10/2022: The wound is strongly malodorous today. There is no purulent drainage, nor any increased erythema. There is some tissue maceration around the wound, but he did not have a whole lot of drainage on his dressings. There is slough on the wound surface. 10/17/2022: The wound still has a fairly strong odor and the drainage has increased. It is goopy and purulent. The culture that I took last week returned late on Thursday with methicillin and tetracycline resistant staph epidermidis and multiple other staph species at fairly high concentrations. Patient History Information obtained from Patient. Family History Cancer - Father, Diabetes - Father,Mother,Paternal Grandparents, Heart Disease -  Father, Hypertension - Father, No family history of Hereditary Spherocytosis, Kidney Disease, Lung Disease, Seizures, Stroke, Thyroid Problems, Tuberculosis. Social History Never smoker, Marital Status - Married, Alcohol Use - Rarely, Drug Use - No History, Caffeine Use - Daily. Medical History Eyes Patient has history of Cataracts - Removed 2008 Cardiovascular Patient has history of Coronary Artery Disease, Hypertension, Myocardial Infarction, Peripheral Arterial Disease Endocrine Patient has history of Type II Diabetes Musculoskeletal Patient has history of Osteomyelitis Neurologic Patient  has history of Neuropathy Medical A Surgical History Notes nd Cardiovascular Hypercholesterolemia Abnormal EKG CABG X3 2019 Gastrointestinal GERD Musculoskeletal Diabetic foot ulcer Objective Constitutional RITVIK, POPOCA A ([id number]) [id number] Page 9 of 12 no acute distress. Vitals Time Taken: 3:00 PM, Height: 74 in, Weight: 187 lbs, BMI: 24, Temperature: 98.3 F, Pulse: 80 bpm, Respiratory Rate: 16 breaths/min, Blood Pressure: 136/75 mmHg. Respiratory Normal work of breathing on room air. General Notes: 10/17/2022: The wound still has a fairly strong odor and the drainage has increased. It is goopy and purulent. There is hypertrophic granulation tissue in the more distal portion of the wound with slough and nonviable subcutaneous tissue present. With his foot at a specific angle, bone can be probed. Integumentary (Hair, Skin) Wound #2 status is Open. Original cause of wound was Pressure Injury. The date acquired was: 06/13/2022. The wound has been in treatment 15 weeks. The wound is located on the Left,Medial Ankle. The wound measures 3.5cm length x 1cm width x 0.5cm depth; 2.749cm^2 area and 1.374cm^3 volume. There is bone and Fat Layer (Subcutaneous Tissue) exposed. There is no tunneling noted, however, there is undermining starting at 4:00 and ending at 9:00 with a maximum distance of 1.5cm. There is a medium amount of purulent drainage noted. Foul odor after cleansing was noted. The wound margin is well defined and not attached to the wound base. There is large (67-100%) pink, pale granulation within the wound bed. There is a small (1-33%) amount of necrotic tissue within the wound bed including Adherent Slough. The periwound skin appearance had no abnormalities noted for color. The periwound skin appearance exhibited: Callus, Maceration. The periwound skin appearance did not exhibit: Dry/Scaly. Periwound temperature was noted as No  Abnormality. Assessment Active Problems ICD-10 Non-pressure chronic ulcer of left ankle with fat layer exposed Peripheral vascular disease, unspecified Type 2 diabetes mellitus with other skin ulcer Procedures Wound #2 Pre-procedure diagnosis of Wound #2 is a Diabetic Wound/Ulcer of the Lower Extremity located on the Left,Medial Ankle .Severity of Tissue Pre Debridement is: Bone involvement without necrosis. There was a Excisional Skin/Subcutaneous Tissue Debridement with a total area of 2.75 sq cm performed by [name], MD. With the following instrument(s): Curette to remove Non-Viable tissue/material. Material removed includes Eschar, Subcutaneous Tissue, and Slough after achieving pain control using Lidocaine 4% T opical Solution. No specimens were taken. A time out was conducted at 15:13, prior to the start of the procedure. A Minimum amount of bleeding was controlled with Pressure. The procedure was tolerated well. Post Debridement Measurements: 3.5cm length x 1cm width x 1cm depth; 2.749cm^3 volume. Character of Wound/Ulcer Post Debridement is improved. Severity of Tissue Post Debridement is: Bone involvement without necrosis. Post procedure Diagnosis Wound #2: Same as Pre-Procedure General Notes: scribed for Dr. Lady Gary by Samuella Bruin, RN. Pre-procedure diagnosis of Wound #2 is a Diabetic Wound/Ulcer of the Lower Extremity located on the Left,Medial Ankle . There was a Double Layer Compression Therapy Procedure by Burt Ek, RN. Post procedure Diagnosis Wound #2: Same as  Pre-Procedure Pre-procedure diagnosis of Wound #2 is a Diabetic Wound/Ulcer of the Lower Extremity located on the Left,Medial Ankle . An Chemical Cauterization procedure was performed by [name], MD. Post procedure Diagnosis Wound #2: Same as Pre-Procedure Notes: scribed for Dr. Lady Gary by Samuella Bruin, RN Plan Follow-up Appointments: Return Appointment in 1 week. - Dr. Lady Gary -  room 2 Anesthetic: (In clinic) Topical Lidocaine 4% applied to wound bed Bathing/ Shower/ Hygiene: May shower with protection but do not get wound dressing(s) wet. Protect dressing(s) with water repellant cover (for example, large plastic bag) or a cast cover and may then take shower. Edema Control - Lymphedema / SCD / Other: Elevate legs to the level of the heart or above for 30 minutes daily and/or when sitting for 3-4 times a day throughout the day. Avoid standing for long periods of time. Off-Loading: Open toe surgical shoe to: - left foot Other: - knee scooter to left leg, stop wearing your orthoboot The following medication(s) was prescribed: lidocaine topical 4 % cream cream topical was prescribed at facility Bactrim DS oral 800 mg-160 mg tablet 1 tab PO BID x 10 days starting 10/17/2022 WOUND #2: - Ankle Wound Laterality: Left, Medial Cleanser: Normal Saline 1 x Per Week/30 Days Discharge Instructions: Cleanse the wound with Normal Saline prior to applying a clean dressing using gauze sponges, not tissue or cotton balls. Peri-Wound Care: Zinc Oxide Ointment 30g tube 1 x Per Week/30 Days MYCHAEL, HOLROYD A ([id number]) [id number] Page 10 of 12 Discharge Instructions: Apply Zinc Oxide to periwound with each dressing change Peri-Wound Care: Sween Lotion (Moisturizing lotion) 1 x Per Week/30 Days Discharge Instructions: Apply moisturizing lotion as directed Topical: Mupirocin Ointment 1 x Per Week/30 Days Discharge Instructions: Apply Mupirocin (Bactroban) as instructed Prim Dressing: Hydrofera Blue Ready Transfer Foam, 2.5x2.5 (in/in) 1 x Per Week/30 Days ary Discharge Instructions: Apply directly to wound bed as directed Secondary Dressing: Woven Gauze Sponge, Non-Sterile 4x4 in 1 x Per Week/30 Days Discharge Instructions: Apply over primary dressing as directed. Secondary Dressing: Zetuvit Plus 4x8 in (Generic) 1 x Per Week/30 Days Discharge Instructions:  Apply over primary dressing as directed. Secured With: Transpore Surgical T ape, 2x10 (in/yd) 1 x Per Week/30 Days Discharge Instructions: Secure dressing with tape as directed. Com pression Wrap: Urgo K2 Lite, two layer compression system, regular 1 x Per Week/30 Days Discharge Instructions: Apply Urgo K2 Lite as directed (alternative to 3 layer compression). 10/17/2022: The wound still has a fairly strong odor and the drainage has increased. It is goopy and purulent. There is hypertrophic granulation tissue in the more distal portion of the wound with slough and nonviable subcutaneous tissue present. With his foot at a specific angle, bone can be probed. I used a curette to debride slough, eschar, and subcutaneous tissue from the wound. I then chemically cauterized the hypertrophic granulation tissue with silver nitrate. We will continue topical mupirocin. I am going to change the contact layer to [facility]. I also prescribed a course of Bactrim to address the infection. Follow-up in 1 week. Electronic Signature(s) Signed: 10/17/2022 3:49:45 PM By: [name] MD FACS Signed: 10/17/2022 4:00:29 PM By: Samuella Bruin Previous Signature: 10/17/2022 3:37:38 PM Version By: [name] MD FACS Previous Signature: 10/17/2022 3:28:26 PM Version By: [name] MD FACS Entered By: Samuella Bruin on 10/17/2022 15:43:09 -------------------------------------------------------------------------------- HxROS Details Patient Name: Date of Service: NA [name], [name] 10/17/2022 3:00 PM Medical Record Number: [id number] Patient Account Number: [id number] Date of Birth/Sex: Treating RN:  Sep 05, 1955 (67 y.o. M) Primary Care Provider: [name] Other Clinician: Referring Provider: Treating Provider/Extender: [name]: 15 Information Obtained From Patient Eyes Medical History: Positive for: Cataracts - Removed 2008 Cardiovascular Medical  History: Positive for: Coronary Artery Disease; Hypertension; Myocardial Infarction; Peripheral Arterial Disease Past Medical History Notes: Hypercholesterolemia Abnormal EKG CABG X3 2019 Gastrointestinal Medical History: Past Medical History Notes: GERD Endocrine Medical History: Positive for: Type II Diabetes Time with diabetes: 24 years Treated with: Insulin, Oral agents Blood sugar tested every day: Yes Tested : Musculoskeletal Medical HistoryGEROGE, Colin ([id number]) [id number] Page 11 of 12 Positive for: Osteomyelitis Past Medical History Notes: Diabetic foot ulcer Neurologic Medical History: Positive for: Neuropathy HBO Extended History Items Eyes: Cataracts Immunizations Pneumococcal Vaccine: Received Pneumococcal Vaccination: Yes Received Pneumococcal Vaccination On or After 60th Birthday: Yes Implantable Devices Yes Family and Social History Cancer: Yes - Father; Diabetes: Yes - Father,Mother,Paternal Grandparents; Heart Disease: Yes - Father; Hereditary Spherocytosis: No; Hypertension: Yes - Father; Kidney Disease: No; Lung Disease: No; Seizures: No; Stroke: No; Thyroid Problems: No; Tuberculosis: No; Never smoker; Marital Status - Married; Alcohol Use: Rarely; Drug Use: No History; Caffeine Use: Daily; Financial Concerns: No; Food, Clothing or Shelter Needs: No; Support System Lacking: No; Transportation Concerns: No Electronic Signature(s) Signed: 10/17/2022 3:49:45 PM By: [name] MD FACS Entered By: [name] on 10/17/2022 15:24:33 -------------------------------------------------------------------------------- SuperBill Details Patient Name: Date of Service: NA [name] A. 10/17/2022 Medical Record Number: [id number] Patient Account Number: [id number] Date of Birth/Sex: Treating RN: [birth year] (67 y.o. M) Primary Care Provider: [name] Other Clinician: Referring Provider: Treating Provider/Extender:  [name]: 15 Diagnosis Coding ICD-10 Codes Code Description [phone] Non-pressure chronic ulcer of left ankle with fat layer exposed I73.9 Peripheral vascular disease, unspecified E11.622 Type 2 diabetes mellitus with other skin ulcer Facility Procedures : CPT4 Code: [id number] Description: 11042 - DEB SUBQ TISSUE 20 SQ CM/< ICD-10 Diagnosis Description L97.322 Non-pressure chronic ulcer of left ankle with fat layer exposed Modifier: Quantity: 1 Physician Procedures : CPT4 Code Description Modifier [id number] - WC PHYS LEVEL 4 - EST PT 25 ICD-10 Diagnosis Description L97.322 Non-pressure chronic ulcer of left ankle with fat layer exposed I73.9 Peripheral vascular disease, unspecified E11.622 Type 2 diabetes  mellitus with other skin ulcer Quantity: 1 : [id number] 11042 - WC PHYS SUBQ TISS 20 SQ CM ICD-10 Diagnosis Description L97.322 Non-pressure chronic ulcer of left ankle with fat layer exposed Easterwood, Kaiser A ([id number]) [id number] Quantity: 1 Page 12 of 12 Electronic Signature(s) Signed: 10/17/2022 3:40:13 PM By: [name] MD FACS Entered By: [name] on 10/17/2022 15:40:13

## 2022-10-24 ENCOUNTER — Ambulatory Visit (HOSPITAL_BASED_OUTPATIENT_CLINIC_OR_DEPARTMENT_OTHER): Payer: BC Managed Care – PPO | Admitting: General Surgery

## 2022-10-27 ENCOUNTER — Ambulatory Visit (HOSPITAL_BASED_OUTPATIENT_CLINIC_OR_DEPARTMENT_OTHER): Payer: BC Managed Care – PPO | Admitting: General Surgery

## 2022-10-27 ENCOUNTER — Ambulatory Visit: Payer: BC Managed Care – PPO | Admitting: Vascular Surgery

## 2022-10-27 ENCOUNTER — Encounter (HOSPITAL_BASED_OUTPATIENT_CLINIC_OR_DEPARTMENT_OTHER): Payer: BC Managed Care – PPO | Admitting: General Surgery

## 2022-10-27 DIAGNOSIS — Z951 Presence of aortocoronary bypass graft: Secondary | ICD-10-CM | POA: Diagnosis not present

## 2022-10-27 DIAGNOSIS — L97322 Non-pressure chronic ulcer of left ankle with fat layer exposed: Secondary | ICD-10-CM | POA: Diagnosis not present

## 2022-10-27 DIAGNOSIS — E1140 Type 2 diabetes mellitus with diabetic neuropathy, unspecified: Secondary | ICD-10-CM | POA: Diagnosis not present

## 2022-10-27 DIAGNOSIS — I2510 Atherosclerotic heart disease of native coronary artery without angina pectoris: Secondary | ICD-10-CM | POA: Diagnosis not present

## 2022-10-27 DIAGNOSIS — E11622 Type 2 diabetes mellitus with other skin ulcer: Secondary | ICD-10-CM | POA: Diagnosis not present

## 2022-10-27 DIAGNOSIS — E1151 Type 2 diabetes mellitus with diabetic peripheral angiopathy without gangrene: Secondary | ICD-10-CM | POA: Diagnosis not present

## 2022-10-31 ENCOUNTER — Ambulatory Visit (HOSPITAL_BASED_OUTPATIENT_CLINIC_OR_DEPARTMENT_OTHER): Payer: BC Managed Care – PPO | Admitting: General Surgery

## 2022-11-03 ENCOUNTER — Other Ambulatory Visit (HOSPITAL_BASED_OUTPATIENT_CLINIC_OR_DEPARTMENT_OTHER): Payer: Self-pay | Admitting: General Surgery

## 2022-11-03 ENCOUNTER — Encounter (HOSPITAL_BASED_OUTPATIENT_CLINIC_OR_DEPARTMENT_OTHER): Payer: BC Managed Care – PPO | Admitting: General Surgery

## 2022-11-03 DIAGNOSIS — L97324 Non-pressure chronic ulcer of left ankle with necrosis of bone: Secondary | ICD-10-CM | POA: Diagnosis not present

## 2022-11-03 DIAGNOSIS — I2510 Atherosclerotic heart disease of native coronary artery without angina pectoris: Secondary | ICD-10-CM | POA: Diagnosis not present

## 2022-11-03 DIAGNOSIS — E1151 Type 2 diabetes mellitus with diabetic peripheral angiopathy without gangrene: Secondary | ICD-10-CM | POA: Diagnosis not present

## 2022-11-03 DIAGNOSIS — L97322 Non-pressure chronic ulcer of left ankle with fat layer exposed: Secondary | ICD-10-CM | POA: Diagnosis not present

## 2022-11-03 DIAGNOSIS — Z951 Presence of aortocoronary bypass graft: Secondary | ICD-10-CM | POA: Diagnosis not present

## 2022-11-03 DIAGNOSIS — E11622 Type 2 diabetes mellitus with other skin ulcer: Secondary | ICD-10-CM | POA: Diagnosis not present

## 2022-11-03 DIAGNOSIS — M86171 Other acute osteomyelitis, right ankle and foot: Secondary | ICD-10-CM | POA: Diagnosis not present

## 2022-11-03 DIAGNOSIS — E1140 Type 2 diabetes mellitus with diabetic neuropathy, unspecified: Secondary | ICD-10-CM | POA: Diagnosis not present

## 2022-11-08 ENCOUNTER — Encounter (HOSPITAL_BASED_OUTPATIENT_CLINIC_OR_DEPARTMENT_OTHER): Payer: BC Managed Care – PPO | Admitting: General Surgery

## 2022-11-08 DIAGNOSIS — E1140 Type 2 diabetes mellitus with diabetic neuropathy, unspecified: Secondary | ICD-10-CM | POA: Diagnosis not present

## 2022-11-08 DIAGNOSIS — L97322 Non-pressure chronic ulcer of left ankle with fat layer exposed: Secondary | ICD-10-CM | POA: Diagnosis not present

## 2022-11-08 DIAGNOSIS — I2510 Atherosclerotic heart disease of native coronary artery without angina pectoris: Secondary | ICD-10-CM | POA: Diagnosis not present

## 2022-11-08 DIAGNOSIS — Z951 Presence of aortocoronary bypass graft: Secondary | ICD-10-CM | POA: Diagnosis not present

## 2022-11-08 DIAGNOSIS — E1151 Type 2 diabetes mellitus with diabetic peripheral angiopathy without gangrene: Secondary | ICD-10-CM | POA: Diagnosis not present

## 2022-11-08 DIAGNOSIS — E11622 Type 2 diabetes mellitus with other skin ulcer: Secondary | ICD-10-CM | POA: Diagnosis not present

## 2022-11-15 ENCOUNTER — Encounter (HOSPITAL_BASED_OUTPATIENT_CLINIC_OR_DEPARTMENT_OTHER): Payer: BC Managed Care – PPO | Attending: General Surgery | Admitting: General Surgery

## 2022-11-15 DIAGNOSIS — L97324 Non-pressure chronic ulcer of left ankle with necrosis of bone: Secondary | ICD-10-CM | POA: Insufficient documentation

## 2022-11-15 DIAGNOSIS — E11622 Type 2 diabetes mellitus with other skin ulcer: Secondary | ICD-10-CM | POA: Insufficient documentation

## 2022-11-15 DIAGNOSIS — E1151 Type 2 diabetes mellitus with diabetic peripheral angiopathy without gangrene: Secondary | ICD-10-CM | POA: Diagnosis not present

## 2022-11-15 DIAGNOSIS — L97522 Non-pressure chronic ulcer of other part of left foot with fat layer exposed: Secondary | ICD-10-CM | POA: Diagnosis not present

## 2022-11-15 DIAGNOSIS — L97326 Non-pressure chronic ulcer of left ankle with bone involvement without evidence of necrosis: Secondary | ICD-10-CM | POA: Diagnosis not present

## 2022-11-17 ENCOUNTER — Encounter (HOSPITAL_BASED_OUTPATIENT_CLINIC_OR_DEPARTMENT_OTHER): Payer: BC Managed Care – PPO | Admitting: General Surgery

## 2022-11-17 ENCOUNTER — Ambulatory Visit (HOSPITAL_COMMUNITY)
Admission: RE | Admit: 2022-11-17 | Discharge: 2022-11-17 | Disposition: A | Discharge status: Home / Self Care | Payer: BC Managed Care – PPO | Source: Ambulatory Visit | Attending: Vascular Surgery | Admitting: Vascular Surgery

## 2022-11-17 ENCOUNTER — Other Ambulatory Visit: Payer: Self-pay

## 2022-11-17 ENCOUNTER — Encounter: Payer: Self-pay | Admitting: Vascular Surgery

## 2022-11-17 ENCOUNTER — Ambulatory Visit: Payer: BC Managed Care – PPO | Admitting: Vascular Surgery

## 2022-11-17 ENCOUNTER — Other Ambulatory Visit: Payer: Self-pay | Admitting: *Deleted

## 2022-11-17 VITALS — BP 128/82 | HR 75 | Temp 98.0°F | Resp 20 | Ht 74.0 in | Wt 189.0 lb

## 2022-11-17 DIAGNOSIS — I70245 Atherosclerosis of native arteries of left leg with ulceration of other part of foot: Secondary | ICD-10-CM

## 2022-11-17 DIAGNOSIS — I70299 Other atherosclerosis of native arteries of extremities, unspecified extremity: Secondary | ICD-10-CM

## 2022-11-17 DIAGNOSIS — L97324 Non-pressure chronic ulcer of left ankle with necrosis of bone: Secondary | ICD-10-CM | POA: Diagnosis not present

## 2022-11-17 DIAGNOSIS — L97909 Non-pressure chronic ulcer of unspecified part of unspecified lower leg with unspecified severity: Secondary | ICD-10-CM

## 2022-11-17 DIAGNOSIS — I739 Peripheral vascular disease, unspecified: Secondary | ICD-10-CM | POA: Insufficient documentation

## 2022-11-17 DIAGNOSIS — E11622 Type 2 diabetes mellitus with other skin ulcer: Secondary | ICD-10-CM | POA: Diagnosis not present

## 2022-11-17 DIAGNOSIS — E1151 Type 2 diabetes mellitus with diabetic peripheral angiopathy without gangrene: Secondary | ICD-10-CM | POA: Diagnosis not present

## 2022-11-17 DIAGNOSIS — L97522 Non-pressure chronic ulcer of other part of left foot with fat layer exposed: Secondary | ICD-10-CM | POA: Diagnosis not present

## 2022-11-17 LAB — VAS US ABI WITH/WO TBI
Left ABI: 1.19
Right ABI: 1.05

## 2022-11-17 NOTE — Progress Notes (Addendum)
REASON FOR VISIT:   Nonhealing wound of the left foot.  The consult is requested by Dr. [name]  MEDICAL ISSUES:   PERIPHERAL ARTERIAL DISEASE WITH ULCERATION: This patient has developed a recurrent ulcer on his left foot.  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  Given that he is developed a recurrent wound I have recommended that we proceed with arteriography to be sure that his circulation is optimized.   I have reviewed with the patient the indications for arteriography. In addition, I have reviewed the potential complications of arteriography including but not limited to: Bleeding, arterial injury, arterial thrombosis, dye action, renal insufficiency, or other unpredictable medical problems. I have explained to the patient that if we find disease amenable to angioplasty we could potentially address this at the same time. I have discussed the potential complications of angioplasty and stenting, including but not limited to: Bleeding, arterial thrombosis, arterial injury, dissection, or the need for surgical intervention.  [name] has atherosclerosis of the native arteries of the Left lower extremities causing ulceration. The patient is on best medical therapy for peripheral arterial disease. The patient has been counseled about the risks of tobacco use in atherosclerotic disease. The patient has been counseled to abstain from any tobacco use. An aortogram with bilateral lower extremity runoff angiography and left lower extremity intervention and is indicated to better evaluate the patient's lower extremity circulation because of the limb threatening nature of the patient's diagnosis. Based on the patient's clinical exam and non-invasive data, we anticipate an endovascular intervention in the femoropopliteal and tibial vessels.  Stenting or  athrectomy would be favored because of the improved primary patency of these interventions as compared to plain balloon  angioplasty.   HPI:   [name] is a pleasant 67 y.o. male who I last saw on 01/13/2022 with osteomyelitis of the left foot.  He had an extensive wound on his left foot but was making excellent progress at the wound care center.  He had a VAC on the wound when I saw him.  He was sent for evaluation for peripheral arterial disease.  Based on his noninvasive study I felt that he likely had adequate circulation to heal the wound.  His toe pressure was 94 mmHg.  Typically a toe pressure of 50-60 is adequate for healing.  He had a palpable femoral and popliteal pulse on the left.  I felt that he may have some underlying tibial disease.  I felt that if the wounds stopped making progress we should consider arteriography.  Since I saw him last the wound healed.  However, he subsequently developed a wound in the more distal part of the foot in March.  This has been slow to heal.  He was sent for reevaluation.  He denies any claudication or rest pain.  His activity is limited by the wound on his foot.  He has undergone previous coronary revascularization.   Past Medical History:  Diagnosis Date   Coronary artery disease    Diabetes mellitus without complication (HCC)    Type II   GERD (gastroesophageal reflux disease)    Hypertension    Myocardial infarction [facility])    "Silent"   Neuropathy     Family History  Problem Relation Age of Onset   Diabetes Mother    Diabetes Father    Heart disease Father    Diabetes Daughter     SOCIAL HISTORY: Social History   Tobacco Use  Smoking status: Never   Smokeless tobacco: Never  Substance Use Topics   Alcohol use: Never    Allergies  Allergen Reactions   Codeine Sulfate [Codeine] Nausea Only    Current Outpatient Medications  Medication Sig Dispense Refill   acetaminophen (TYLENOL) 500 MG tablet Take 2 tablets (1,000 mg total) by mouth every 6 (six) hours as needed for mild pain or fever. 30 tablet 0   aspirin EC 81 MG tablet Take 81  mg by mouth daily.     atorvastatin (LIPITOR) 80 MG tablet Take 80 mg by mouth daily.     calcium carbonate (TUMS - DOSED IN MG ELEMENTAL CALCIUM) 500 MG chewable tablet Chew 1-2 tablets by mouth daily as needed for indigestion or heartburn.     ibuprofen (ADVIL) 200 MG tablet Take 400 mg by mouth every 8 (eight) hours as needed (pain.).     LANTUS 100 UNIT/ML injection Inject 30 Units into the skin 2 (two) times daily.     lisinopril (ZESTRIL) 10 MG tablet Take 10 mg by mouth in the morning.     metFORMIN (GLUCOPHAGE) 850 MG tablet Take 850 mg by mouth in the morning, at noon, and at bedtime.     metoprolol succinate (TOPROL-XL) 50 MG 24 hr tablet Take 50 mg by mouth daily. Take with or immediately following a meal.     sitaGLIPtin (JANUVIA) 100 MG tablet Take 100 mg by mouth every morning.      No current facility-administered medications for this visit.    REVIEW OF SYSTEMS:  [X]  denotes positive finding, [ ]  denotes negative finding Cardiac  Comments:  Chest pain or chest pressure:    Shortness of breath upon exertion:    Short of breath when lying flat:    Irregular heart rhythm:        Vascular    Pain in calf, thigh, or hip brought on by ambulation:    Pain in feet at night that wakes you up from your sleep:     Blood clot in your veins:    Leg swelling:  x       Pulmonary    Oxygen at home:    Productive cough:     Wheezing:         Neurologic    Sudden weakness in arms or legs:     Sudden numbness in arms or legs:     Sudden onset of difficulty speaking or slurred speech:    Temporary loss of vision in one eye:     Problems with dizziness:         Gastrointestinal    Blood in stool:     Vomited blood:         Genitourinary    Burning when urinating:     Blood in urine:        Psychiatric    Major depression:         Hematologic    Bleeding problems:    Problems with blood clotting too easily:        Skin    Rashes or ulcers:        Constitutional     Fever or chills:     PHYSICAL EXAM:   Vitals:   11/17/22 1311  BP: 128/82  Pulse: 75  Resp: 20  Temp: 98 F (36.7 C)  SpO2: 98%  Weight: 189 lb (85.7 kg)  Height: 6\' 2"  (1.88 m)    GENERAL: The patient is a well-nourished  male, in no acute distress. The vital signs are documented above. CARDIAC: There is a regular rate and rhythm.  VASCULAR: I do not detect carotid bruits. He has palpable femoral and popliteal pulses bilaterally. On the right side he has a monophasic dorsalis pedis signal with a biphasic posterior tibial signal. On the left side, which is the site of concern, he has a barely biphasic dorsalis pedis and posterior tibial signal. PULMONARY: There is good air exchange bilaterally without wheezing or rales. ABDOMEN: Soft and non-tender with normal pitched bowel sounds.  MUSCULOSKELETAL: There are no major deformities or cyanosis. NEUROLOGIC: No focal weakness or paresthesias are detected. SKIN: He has a wound on the left foot as documented in the photograph below.     PSYCHIATRIC: The patient has a normal affect.  DATA:    ARTERIAL DOPPLER STUDY: I have independently interpreted his arterial Doppler study today.  On the right side, he has a biphasic posterior tibial and dorsalis pedis signal.  ABIs 100%.  Toe pressures 88 mmHg.  On the left side he has a triphasic posterior tibial and dorsalis pedis signal.  ABIs 100% although this may be falsely elevated.  Toe pressures 82 mmHg.  Waverly Ferrari [facility] [phone]

## 2022-11-17 NOTE — H&P (View-Only) (Signed)
  REASON FOR VISIT:   Nonhealing wound of the left foot.  The consult is requested by Dr. Jennifer Cannon  MEDICAL ISSUES:   PERIPHERAL ARTERIAL DISEASE WITH ULCERATION: This patient has developed a recurrent ulcer on his left foot.  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  Given that he is developed a recurrent wound I have recommended that we proceed with arteriography to be sure that his circulation is optimized.   I have reviewed with the patient the indications for arteriography. In addition, I have reviewed the potential complications of arteriography including but not limited to: Bleeding, arterial injury, arterial thrombosis, dye action, renal insufficiency, or other unpredictable medical problems. I have explained to the patient that if we find disease amenable to angioplasty we could potentially address this at the same time. I have discussed the potential complications of angioplasty and stenting, including but not limited to: Bleeding, arterial thrombosis, arterial injury, dissection, or the need for surgical intervention.  [name] has atherosclerosis of the native arteries of the Left lower extremities causing ulceration. The patient is on best medical therapy for peripheral arterial disease. The patient has been counseled about the risks of tobacco use in atherosclerotic disease. The patient has been counseled to abstain from any tobacco use. An aortogram with bilateral lower extremity runoff angiography and left lower extremity intervention and is indicated to better evaluate the patient's lower extremity circulation because of the limb threatening nature of the patient's diagnosis. Based on the patient's clinical exam and non-invasive data, we anticipate an endovascular intervention in the femoropopliteal and tibial vessels.  Stenting or  athrectomy would be favored because of the improved primary patency of these interventions as compared to plain balloon  angioplasty.   HPI:   [name] is a pleasant 67 y.o. male who I last saw on 01/13/2022 with osteomyelitis of the left foot.  He had an extensive wound on his left foot but was making excellent progress at the wound care center.  He had a VAC on the wound when I saw him.  He was sent for evaluation for peripheral arterial disease.  Based on his noninvasive study I felt that he likely had adequate circulation to heal the wound.  His toe pressure was 94 mmHg.  Typically a toe pressure of 50-60 is adequate for healing.  He had a palpable femoral and popliteal pulse on the left.  I felt that he may have some underlying tibial disease.  I felt that if the wounds stopped making progress we should consider arteriography.  Since I saw him last the wound healed.  However, he subsequently developed a wound in the more distal part of the foot in March.  This has been slow to heal.  He was sent for reevaluation.  He denies any claudication or rest pain.  His activity is limited by the wound on his foot.  He has undergone previous coronary revascularization.   Past Medical History:  Diagnosis Date   Coronary artery disease    Diabetes mellitus without complication (HCC)    Type II   GERD (gastroesophageal reflux disease)    Hypertension    Myocardial infarction (HCC)    "Silent"   Neuropathy     Family History  Problem Relation Age of Onset   Diabetes Mother    Diabetes Father    Heart disease Father    Diabetes Daughter     SOCIAL HISTORY: Social History   Tobacco Use     Smoking status: Never   Smokeless tobacco: Never  Substance Use Topics   Alcohol use: Never    Allergies  Allergen Reactions   Codeine Sulfate [Codeine] Nausea Only    Current Outpatient Medications  Medication Sig Dispense Refill   acetaminophen (TYLENOL) 500 MG tablet Take 2 tablets (1,000 mg total) by mouth every 6 (six) hours as needed for mild pain or fever. 30 tablet 0   aspirin EC 81 MG tablet Take 81  mg by mouth daily.     atorvastatin (LIPITOR) 80 MG tablet Take 80 mg by mouth daily.     calcium carbonate (TUMS - DOSED IN MG ELEMENTAL CALCIUM) 500 MG chewable tablet Chew 1-2 tablets by mouth daily as needed for indigestion or heartburn.     ibuprofen (ADVIL) 200 MG tablet Take 400 mg by mouth every 8 (eight) hours as needed (pain.).     LANTUS 100 UNIT/ML injection Inject 30 Units into the skin 2 (two) times daily.     lisinopril (ZESTRIL) 10 MG tablet Take 10 mg by mouth in the morning.     metFORMIN (GLUCOPHAGE) 850 MG tablet Take 850 mg by mouth in the morning, at noon, and at bedtime.     metoprolol succinate (TOPROL-XL) 50 MG 24 hr tablet Take 50 mg by mouth daily. Take with or immediately following a meal.     sitaGLIPtin (JANUVIA) 100 MG tablet Take 100 mg by mouth every morning.      No current facility-administered medications for this visit.    REVIEW OF SYSTEMS:  [X] denotes positive finding, [ ] denotes negative finding Cardiac  Comments:  Chest pain or chest pressure:    Shortness of breath upon exertion:    Short of breath when lying flat:    Irregular heart rhythm:        Vascular    Pain in calf, thigh, or hip brought on by ambulation:    Pain in feet at night that wakes you up from your sleep:     Blood clot in your veins:    Leg swelling:  x       Pulmonary    Oxygen at home:    Productive cough:     Wheezing:         Neurologic    Sudden weakness in arms or legs:     Sudden numbness in arms or legs:     Sudden onset of difficulty speaking or slurred speech:    Temporary loss of vision in one eye:     Problems with dizziness:         Gastrointestinal    Blood in stool:     Vomited blood:         Genitourinary    Burning when urinating:     Blood in urine:        Psychiatric    Major depression:         Hematologic    Bleeding problems:    Problems with blood clotting too easily:        Skin    Rashes or ulcers:        Constitutional     Fever or chills:     PHYSICAL EXAM:   Vitals:   11/17/22 1311  BP: 128/82  Pulse: 75  Resp: 20  Temp: 98 F (36.7 C)  SpO2: 98%  Weight: 189 lb (85.7 kg)  Height: 6' 2" (1.88 m)    GENERAL: The patient is a well-nourished   male, in no acute distress. The vital signs are documented above. CARDIAC: There is a regular rate and rhythm.  VASCULAR: I do not detect carotid bruits. He has palpable femoral and popliteal pulses bilaterally. On the right side he has a monophasic dorsalis pedis signal with a biphasic posterior tibial signal. On the left side, which is the site of concern, he has a barely biphasic dorsalis pedis and posterior tibial signal. PULMONARY: There is good air exchange bilaterally without wheezing or rales. ABDOMEN: Soft and non-tender with normal pitched bowel sounds.  MUSCULOSKELETAL: There are no major deformities or cyanosis. NEUROLOGIC: No focal weakness or paresthesias are detected. SKIN: He has a wound on the left foot as documented in the photograph below.     PSYCHIATRIC: The patient has a normal affect.  DATA:    ARTERIAL DOPPLER STUDY: I have independently interpreted his arterial Doppler study today.  On the right side, he has a biphasic posterior tibial and dorsalis pedis signal.  ABIs 100%.  Toe pressures 88 mmHg.  On the left side he has a triphasic posterior tibial and dorsalis pedis signal.  ABIs 100% although this may be falsely elevated.  Toe pressures 82 mmHg.  [name] [facility] [phone] 

## 2022-11-18 NOTE — Progress Notes (Signed)
RANSOME, HELWIG A ([id number]) [id number] Page 1 of 1 Visit Report for 11/17/2022 SuperBill Details Patient Name: Date of Service: NA [name] 11/17/2022 Medical Record Number: [id number] Patient Account Number: [id number] Date of Birth/Sex: Treating RN: 1956/03/06 (67 y.o. 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

## 2022-11-21 ENCOUNTER — Encounter (HOSPITAL_BASED_OUTPATIENT_CLINIC_OR_DEPARTMENT_OTHER): Payer: BC Managed Care – PPO | Admitting: General Surgery

## 2022-11-21 DIAGNOSIS — L97522 Non-pressure chronic ulcer of other part of left foot with fat layer exposed: Secondary | ICD-10-CM | POA: Diagnosis not present

## 2022-11-21 DIAGNOSIS — L97324 Non-pressure chronic ulcer of left ankle with necrosis of bone: Secondary | ICD-10-CM | POA: Diagnosis not present

## 2022-11-21 DIAGNOSIS — E1151 Type 2 diabetes mellitus with diabetic peripheral angiopathy without gangrene: Secondary | ICD-10-CM | POA: Diagnosis not present

## 2022-11-21 DIAGNOSIS — E11622 Type 2 diabetes mellitus with other skin ulcer: Secondary | ICD-10-CM | POA: Diagnosis not present

## 2022-11-21 DIAGNOSIS — L97322 Non-pressure chronic ulcer of left ankle with fat layer exposed: Secondary | ICD-10-CM | POA: Diagnosis not present

## 2022-11-22 NOTE — Progress Notes (Signed)
JERARDO, MARKWOOD A ([id number]) [id number] Page 1 of 6 Visit Report for 10/27/2022 Arrival Information Details Patient Name: Date of Service: NA [name] 10/27/2022 2:45 PM Medical Record Number: [id number] Patient Account Number: [id number] Date of Birth/Sex: Treating RN: 1956-03-15 (67 y.o. M) Primary Care Turner Kunzman: [name] Other Clinician: Referring Clayvon Parlett: Treating Brittain Hosie/Extender: [name] in Treatment: 16 Visit Information History Since Last Visit All ordered tests and consults were completed: No Patient Arrived: Knee Scooter Added or deleted any medications: No Arrival Time: 14:58 Any new allergies or adverse reactions: No Accompanied By: self Had a fall or experienced change in No Transfer Assistance: None activities of daily living that may affect Patient Identification Verified: Yes risk of falls: Secondary Verification Process Completed: Yes Signs or symptoms of abuse/neglect since last visito No Patient Requires Transmission-Based Precautions: No Hospitalized since last visit: No Patient Has Alerts: No Implantable device outside of the clinic excluding No cellular tissue based products placed in the center since last visit: Pain Present Now: No Electronic Signature(s) Signed: 10/27/2022 3:13:26 PM By: Dayton Scrape Entered By: Dayton Scrape on 10/27/2022 14:59:14 -------------------------------------------------------------------------------- Encounter Discharge Information Details Patient Name: Date of Service: NA [name] 10/27/2022 2:45 PM Medical Record Number: [id number] Patient Account Number: [id number] Date of Birth/Sex: Treating RN: 1955-07-08 (67 y.o. Yates Decamp Primary Care Daysi Boggan: [name] Other Clinician: Referring Ellijah Leffel: Treating Kasha Howeth/Extender: [name] in Treatment: 16 Encounter Discharge Information Items Post Procedure Vitals Discharge  Condition: Stable Temperature (F): 98.2 Ambulatory Status: Ambulatory Pulse (bpm): 80 Discharge Destination: Home Respiratory Rate (breaths/min): 18 Transportation: Private Auto Blood Pressure (mmHg): 132/68 Accompanied By: self Schedule Follow-up Appointment: Yes Clinical Summary of Care: Patient Declined Electronic Signature(s) Signed: 11/22/2022 7:49:05 AM By: Brenton Grills Entered By: Brenton Grills on 10/27/2022 16:01:57 -------------------------------------------------------------------------------- Lower Extremity Assessment Details Patient Name: Date of Service: [name] 10/27/2022 2:45 PM Medical Record Number: [id number] Patient Account Number: [id number] Date of Birth/Sex: Treating RN: 04/09/1956 (67 y.o. Yates Decamp Primary Care Jadarius Commons: [name] Other Clinician: Referring Layton Tappan: Treating Kethan Papadopoulos/Extender: [name]: 16 Edema Assessment Assessed: [name]: No] [Right: No] N[LeftWILLEM, CARDIEL A ([id number]: [id number] Page 2 of 6] Edema: [Left: Ye] [Right: s] Calf Left: Right: Point of Measurement: From Medial Instep 33.5 cm Ankle Left: Right: Point of Measurement: From Medial Instep 21.8 cm Vascular Assessment Pulses: Dorsalis Pedis Palpable: [Left:Yes] Electronic Signature(s) Signed: 11/22/2022 7:49:05 AM By: Brenton Grills Entered By: Brenton Grills on 10/27/2022 15:28:24 -------------------------------------------------------------------------------- Multi Wound Chart Details Patient Name: Date of Service: NA [name] 10/27/2022 2:45 PM Medical Record Number: [id number] Patient Account Number: [id number] Date of Birth/Sex: Treating RN: 1955/09/08 (67 y.o. M) Primary Care Barton Want: [name] Other Clinician: Referring Baylea Milburn: Treating Jakson Delpilar/Extender: [name]: 16 Vital Signs Height(in): 74 Pulse(bpm):  79 Weight(lbs): 187 Blood Pressure(mmHg): 127/80 Body Mass Index(BMI): 24 Temperature(F): 98.0 Respiratory Rate(breaths/min): 20 [2:Photos:] [N/A:N/A] Left, Medial Ankle N/A N/A Wound Location: Pressure Injury N/A N/A Wounding Event: Diabetic Wound/Ulcer of the Lower N/A N/A Primary Etiology: Extremity Cataracts, Coronary Artery Disease, N/A N/A Comorbid History: Hypertension, Myocardial Infarction, Peripheral Arterial Disease, Type II Diabetes, Osteomyelitis, Neuropathy 06/13/2022 N/A N/A Date Acquired: 16 N/A N/A Weeks of Treatment: Open N/A N/A Wound Status: No N/A N/A Wound Recurrence: 1.8x1.5x0.5 N/A N/A Measurements L x W x D (cm) 2.121 N/A N/A A (cm) : rea 1.06 N/A  N/A Volume (cm) : -3.90% N/A N/A % Reduction in A rea: -419.60% N/A N/A % Reduction in Volume: Grade 3 N/A N/A Classification: Medium N/A N/A Exudate A mount: Purulent N/A N/A Exudate Type: yellow, brown, green N/A N/A Exudate Color: Yes N/A N/A Foul Odor A Cleansing: fter No N/A N/A Odor A nticipated Due to Product Use: Well defined, not attached N/A N/A Wound MarginREXFORD, PREVO A ([id number]) [id number] Page 3 of 6 Large (67-100%) N/A N/A Granulation Amount: Pink, Pale N/A N/A Granulation Quality: Small (1-33%) N/A N/A Necrotic Amount: Fat Layer (Subcutaneous Tissue): Yes N/A N/A Exposed Structures: Bone: Yes Fascia: No Tendon: No Muscle: No Joint: No Small (1-33%) N/A N/A Epithelialization: Debridement - Excisional N/A N/A Debridement: Pre-procedure Verification/Time Out 15:41 N/A N/A Taken: Lidocaine 4% Topical Solution N/A N/A Pain Control: Subcutaneous, Slough N/A N/A Tissue Debrided: Skin/Subcutaneous Tissue N/A N/A Level: 2.12 N/A N/A Debridement A (sq cm): rea Curette N/A N/A Instrument: Minimum N/A N/A Bleeding: Pressure N/A N/A Hemostasis A chieved: 0 N/A N/A Procedural Pain: 0 N/A N/A Post Procedural Pain: Procedure  was tolerated well N/A N/A Debridement Treatment Response: 1.8x1.5x0.5 N/A N/A Post Debridement Measurements L x W x D (cm) 1.06 N/A N/A Post Debridement Volume: (cm) Callus: Yes N/A N/A Periwound Skin Texture: Maceration: Yes N/A N/A Periwound Skin Moisture: Dry/Scaly: No No Abnormalities Noted N/A N/A Periwound Skin Color: No Abnormality N/A N/A Temperature: Debridement N/A N/A Procedures Performed: Treatment Notes Electronic Signature(s) Signed: 10/27/2022 3:57:30 PM By: [name] MD FACS Entered By: [name] on 10/27/2022 15:57:30 -------------------------------------------------------------------------------- Multi-Disciplinary Care Plan Details Patient Name: Date of Service: [name] 10/27/2022 2:45 PM Medical Record Number: [id number] Patient Account Number: [id number] Date of Birth/Sex: Treating RN: 1956/01/04 (67 y.o. Yates Decamp Primary Care Jacari Iannello: [name] Other Clinician: Referring Agapita Savarino: Treating Signa Cheek/Extender: [name] in Treatment: 16 Multidisciplinary Care Plan reviewed with physician Active Inactive Pressure Nursing Diagnoses: Knowledge deficit related to causes and risk factors for pressure ulcer development Knowledge deficit related to management of pressures ulcers Goals: Patient will remain free of pressure ulcers Date Initiated: 07/04/2022 Target Resolution Date: 12/09/2022 Goal Status: Active Interventions: Assess: immobility, friction, shearing, incontinence upon admission and as needed Assess potential for pressure ulcer upon admission and as needed Treatment Activities: Patient referred for pressure reduction/relief devices : 07/04/2022 Notes: Electronic Signature(s) ONDRE, SALVETTI A ([id number]) [id number] Page 4 of 6 Signed: 11/22/2022 7:49:05 AM By: Brenton Grills Entered By: Brenton Grills on 10/27/2022  15:33:05 -------------------------------------------------------------------------------- Pain Assessment Details Patient Name: Date of Service: [name] 10/27/2022 2:45 PM Medical Record Number: [id number] Patient Account Number: [id number] Date of Birth/Sex: Treating RN: 1955/10/14 (67 y.o. M) Primary Care Sidonia Nutter: [name] Other Clinician: Referring Lantz Hermann: Treating Maevyn Riordan/Extender: [name]: 16 Active Problems Location of Pain Severity and Description of Pain Patient Has Paino No Site Locations Pain Management and Medication Current Pain Management: Electronic Signature(s) Signed: 10/27/2022 3:13:26 PM By: Dayton Scrape Entered By: Dayton Scrape on 10/27/2022 14:59:52 -------------------------------------------------------------------------------- Patient/Caregiver Education Details Patient Name: Date of Service: NA [name] 5/16/2024andnbsp2:45 PM Medical Record Number: [id number] Patient Account Number: [id number] Date of Birth/Gender: Treating RN: 02/04/1956 (67 y.o. Yates Decamp Primary Care Physician: [name] Other Clinician: Referring Physician: Treating Physician/Extender: [name] in Treatment: 16 Education Assessment Education Provided To: Patient Education Topics Provided Wound/Skin Impairment: Methods: Explain/Verbal Responses: State content correctly [employer]) Signed: 11/22/2022  7:49:05 AM By: Jimmy Picket, Maximiano Coss ([id number]) [id number] Page 5 of 6 Entered By: Brenton Grills on 10/27/2022 15:33:28 -------------------------------------------------------------------------------- Wound Assessment Details Patient Name: Date of Service: NA [name] 10/27/2022 2:45 PM Medical Record Number: [id number] Patient Account Number: [id number] Date of Birth/Sex: Treating RN: January 13, 1956 (67 y.o. M) Primary Care Ibrahim Mcpheeters:  [name] Other Clinician: Referring Neal Trulson: Treating Nahla Lukin/Extender: [name]: 16 Wound Status Wound Number: 2 Primary Diabetic Wound/Ulcer of the Lower Extremity Etiology: Wound Location: Left, Medial Ankle Wound Open Wounding Event: Pressure Injury Status: Date Acquired: 06/13/2022 Comorbid Cataracts, Coronary Artery Disease, Hypertension, Myocardial Weeks Of Treatment: 16 History: Infarction, Peripheral Arterial Disease, Type II Diabetes, Clustered Wound: No Osteomyelitis, Neuropathy Photos Wound Measurements Length: (cm) 1.8 Width: (cm) 1.5 Depth: (cm) 0.5 Area: (cm) 2.121 Volume: (cm) 1.06 % Reduction in Area: -3.9% % Reduction in Volume: -419.6% Epithelialization: Small (1-33%) Tunneling: No Undermining: No Wound Description Classification: Grade 3 Wound Margin: Well defined, not attached Exudate Amount: Medium Exudate Type: Purulent Exudate Color: yellow, brown, green Foul Odor After Cleansing: Yes Due to Product Use: No Slough/Fibrino Yes Wound Bed Granulation Amount: Large (67-100%) Exposed Structure Granulation Quality: Pink, Pale Fascia Exposed: No Necrotic Amount: Small (1-33%) Fat Layer (Subcutaneous Tissue) Exposed: Yes Necrotic Quality: Adherent Slough Tendon Exposed: No Muscle Exposed: No Joint Exposed: No Bone Exposed: Yes Periwound Skin Texture Texture Color No Abnormalities Noted: No No Abnormalities Noted: Yes Callus: Yes Temperature / Pain Temperature: No Abnormality Moisture No Abnormalities Noted: No Dry / Scaly: No Maceration: Yes Electronic Signature(s) Signed: 11/22/2022 7:49:05 AM By: Brenton Grills Previous Signature: 10/27/2022 3:13:26 PM Version By: Dayton Scrape Entered By: Brenton Grills on 10/27/2022 15:32:01 Lasandra Beech A ([id number]) [id number] Page 6 of 6 -------------------------------------------------------------------------------- Vitals  Details Patient Name: Date of Service: NA [name] 10/27/2022 2:45 PM Medical Record Number: [id number] Patient Account Number: [id number] Date of Birth/Sex: Treating RN: 1956/02/18 (67 y.o. M) Primary Care Verina Galeno: [name] Other Clinician: Referring Treshawn Allen: Treating Zahid Carneiro/Extender: [name]: 16 Vital Signs Time Taken: 02:59 Temperature (F): 98.0 Height (in): 74 Pulse (bpm): 79 Weight (lbs): 187 Respiratory Rate (breaths/min): 20 Body Mass Index (BMI): 24 Blood Pressure (mmHg): 127/80 Reference Range: 80 - 120 mg / dl Electronic Signature(s) Signed: 10/27/2022 3:13:26 PM By: Dayton Scrape Entered By: Dayton Scrape on 10/27/2022 14:59:34

## 2022-11-22 NOTE — Progress Notes (Signed)
[name] ([id number]) [id number] Page 1 of 11 Visit Report for 10/27/2022 Chief Complaint Document Details Patient Name: Date of Service: NA [name] 10/27/2022 2:45 PM Medical Record Number: [id number] Patient Account Number: [id number] Date of Birth/Sex: Treating RN: 11-15-1955 (67 y.o. M) Primary Care Provider: [name] Other Clinician: Referring Provider: Treating Provider/Extender: [name] in Treatment: 16 Information Obtained from: Patient Chief Complaint Patients presents for treatment of an open diabetic ulcer with exposed bone and osteomyelitis 07/04/2022: Diabetic ankle ulcer Electronic Signature(s) Signed: 10/27/2022 3:57:40 PM By: [name] MD FACS Entered By: [name] on 10/27/2022 15:57:40 -------------------------------------------------------------------------------- Debridement Details Patient Name: Date of Service: NA [name]. 10/27/2022 2:45 PM Medical Record Number: [id number] Patient Account Number: [id number] Date of Birth/Sex: Treating RN: 13-Jul-1955 (67 y.o. [name] Primary Care Provider: [name] Other Clinician: Referring Provider: Treating Provider/Extender: [name]: 16 Debridement Performed for Assessment: Wound #2 Left,Medial Ankle Performed By: Physician [name], MD Debridement Type: Debridement Severity of Tissue Pre Debridement: Fat layer exposed Level of Consciousness (Pre-procedure): Awake and Alert Pre-procedure Verification/Time Out Yes - 15:41 Taken: Start Time: 15:42 Pain Control: Lidocaine 4% Topical Solution Percent of Wound Bed Debrided: 100% T Area Debrided (cm): otal 2.12 Tissue and other material debrided: Slough, Subcutaneous, Slough Level: Skin/Subcutaneous Tissue Debridement Description: Excisional Instrument: Curette Bleeding: Minimum Hemostasis Achieved: Pressure End Time:  15:44 Procedural Pain: 0 Post Procedural Pain: 0 Response to Treatment: Procedure was tolerated well Level of Consciousness (Post- Awake and Alert procedure): Post Debridement Measurements of Total Wound Length: (cm) 1.8 Width: (cm) 1.5 Depth: (cm) 0.5 Volume: (cm) 1.06 Character of Wound/Ulcer Post Debridement: Improved Severity of Tissue Post Debridement: Fat layer exposed Post Procedure Diagnosis Same as Pre-procedure Notes Scribed for Dr Lady Gary by Brenton Grills, RN. GAD, [name] ([id number]) [id number] Page 2 of 11 Electronic Signature(s) Signed: 10/27/2022 4:03:18 PM By: [name] MD FACS Signed: 11/22/2022 7:49:05 AM By: Brenton Grills Entered By: Brenton Grills on 10/27/2022 15:43:46 -------------------------------------------------------------------------------- HPI Details Patient Name: Date of Service: NA [name]. 10/27/2022 2:45 PM Medical Record Number: [id number] Patient Account Number: [id number] Date of Birth/Sex: Treating RN: 1956-03-13 (67 y.o. M) Primary Care Provider: [name] Other Clinician: Referring Provider: Treating Provider/Extender: [name] in Treatment: 16 History of Present Illness HPI Description: ADMISSION 08/25/2021 This is [name] 67 year old man who initially presented to his primary care provider in September 2022 with pain in his left foot. He was sent for an x-ray and while the x-ray was being performed, the tech pointed out [name] wound on his foot that the patient was not aware existed. He does have type 2 diabetes with significant neuropathy. His diabetes is suboptimally controlled with his most recent A1c being 8.5. He also has [name] history of coronary artery disease status post three- vessel CABG. he was initially seen by orthopedics, but they referred him to [facility]. He has undergone at least 7 operations/debridements and several applications of skin  substitute under the care of podiatry. He has been in [name] for much of this time. His most recent procedure was July 28, 2021. [name] was biopsied and was found to be consistent with osteomyelitis. Culture also returned positive for corynebacterium. He was seen on August 16, 2021 by infectious disease. [name] PICC line has been placed and he will be receiving [name] 6-week course of  IV daptomycin and cefepime. In October 2022, he underwent lower extremity vascular studies. Results are copied here: Right: Resting right ankle-brachial index is within normal range. No evidence of significant right lower extremity arterial disease. The right toe-brachial index is abnormal. Left: Resting left ankle-brachial index indicates mild left lower extremity arterial disease. The left toe-brachial index is abnormal. He has not been seen by vascular surgery despite these findings. He presented to clinic today in [name] and is using [name] to offload. Wound VAC was in place. Once this was removed, [name] large ulcer was identified on the left midfoot/ankle. Bone is frankly exposed. There is no malodorous or purulent drainage. There is some granulation tissue over the central portion of the exposed bone. There is [name] tunnel that extends posteriorly for roughly 10 cm. It has been discussed with him by multiple providers that he is at very high risk of losing his lower leg because of this wound. He is extremely eager to avoid this outcome and is here today to review his options as well as receive ongoing wound care. 09/03/2021: Here for reevaluation of his wound. There does not appear to have been any substantial improvement overall since our last visit. He has been in [name] with white foam overlying the exposed bone. We are working on getting him approved for hyperbaric oxygen therapy. 09/10/2021: We are in the process of getting him cleared to begin hyperbaric oxygen therapy. He still needs to  obtain [name] chest x-ray. Although the wound measurements are roughly the same, I think the overall appearance of the wound is better. The exposed bone has [name] bit more granulation tissue covering it. He has not received [name] vascular surgery appointment to reevaluate his flow to the wound. 09/17/2021: He has been approved for hyperbaric oxygen therapy and completed his chest x-ray, which I reviewed and it appears normal. The tunnels at the 12 and 10:00 positions are smaller. There is more granulation tissue covering the exposed bone and the undermining has decreased. He still has not received [name] vascular surgery appointment. 09/24/2021: He initiated hyperbaric oxygen therapy this week and is tolerating it well. He has an appointment with vascular surgery coming up on May 16. The granulation tissue is covering more of the exposed bone and both tunnels are [name] bit smaller. 10/01/2021: He continues to tolerate hyperbaric oxygen therapy. He saw infectious disease and they are planning to pull his PICC line. He has been initiated on oral antibiotics (doxycycline and Augmentin). The wound looks about the same but the tunnels are [name] little bit smaller. The skin seems to be contracting somewhat around the exposed bone. 10/08/2021: The wound is still about the same size, but the tunnels continue to come in and the skin is contracting around the exposed bone. He continues to have some accumulation of necrotic material in the inferoposterior aspect of the wound as well as accumulation at the 12:00 tunnel area. 10/15/2021: The wound is smaller today. The tunnels continue to come in. There is less necrotic tissue present. He does have some periwound maceration. 10/22/2021: The wound is about the same size. There is [name] little bit less undermining at the distal portion. The exposed bone is dark and I am not sure if this is staining from silver nitrate or his VAC sponge or if it represents necrosis. The tunnels are shallower but he does  have some serous drainage coming from the 10:00 tunnel. He continues to tolerate hyperbaric oxygen therapy well. 10/29/2021:  The undermining continues to improve. The tunnels are about the same. He has good granulation tissue overlying the majority of the exposed bone. It does appear that perhaps the tubing from his wound VAC has been eroding the skin at the 12 clock position. He continues to accumulate senescent epithelium around the borders of the wound. 11/05/2021: The undermining is almost completely resolved. The tunnels have contracted fairly significantly. No significant slough or debris accumulation. There is still senescent epithelium accumulation around the borders of the wound. He has been tolerating hyperbaric oxygen therapy well. 11/12/2021: Despite the measurements of the wound being about the same, the wound has changed in its shape and overall, I think it is improved. The undermining has resolved and the tunnels continue to shorten. There is good granulation tissue encroaching over the small area of bone that has remained exposed at the 12 o'clock position. Minimal slough accumulation. He continues to tolerate hyperbaric oxygen therapy well. 11/19/2021: I took [name] PCR culture last week. There was overgrowth of yeast. He is already taking suppressive doxycycline and Augmentin. I added fluconazole to [name], [name] [phone].pdf Page 3 of 11 his regimen. The wound is smaller again today. The tunnels continue to shorten. He continues to do well with hyperbaric oxygen therapy. 11/26/2021: For some reason, his foot has become macerated. The wound is narrower but about the same dimensions in its longitudinal aspect. The tunnels continue to shorten. He has some slough buildup on the wound as well as some heaped up senescent epithelium around the perimeter. 12/03/2021: No further maceration of his foot has occurred. The wound has contracted quite significantly  from last week. The tunnel at 10:00 is closed. The tunnel at 12:00 is down to just [name] couple of millimeters. No other undermining is present. There is soft tissue coverage of the previously exposed bone. There is just [name] bit of slough and biofilm on the wound surface. 12/10/2021: The wound is looking good. It turns out the tunnel at 12:00 is only exposed when the patient dorsiflexes his foot. It is about 2 cm in depth when he does this; when his foot is in plantarflexion, the tunnel is closed. The bone that was visible at the 12:00 tunnel is completely covered with granulation tissue, but there does feel like some exposed bone deeper into the tunnel area. There is senescent skin heaped up around the periphery. Minimal slough on the wound surface. 12/16/2021: The wound dimensions are roughly the same. The surface has nice granulation tissue. The exposed bone at the 12:00 tunnel continues to be covered with more soft tissue. 7/14; patient's wound measures smaller today. Using the wound VAC with underlying collagen. He is also being treated with HBO for underlying osteomyelitis. He tells me he is on doxycycline and ampicillin follows with infectious disease next week 12/31/2021: The wound continues to contract. Unfortunately, the area where the track pad and tubing have been rubbing continues to look like it is applying friction. He says that the home health nurses that have been applying the [facility] have been putting gauze underneath the tubing, but nonetheless there is ongoing tissue breakdown at this site. Light slough accumulation on the wound surface. The tunnel continues to contract. He is tolerating HBO without difficulty. 01/07/2022: Bridging the wound VAC away from the ankle has resulted in significant improvement in the tissue at the apex of the wound. The tunnel is still present and is not all that much shorter, but the overall wound surface is very robust and  healthy looking. Minimal slough  accumulation. No concern for acute infection. 01/21/2022: The wound continues to contract and has [name] robust granulation tissue surface. The tunnel has come in considerably and is down to about 1.4 cm. There is still bone exposed within the tunnel but the rest of it is well covered. There is some senescent epithelium at the wound margins and [name] little bit of slough on the surface. 01/28/2022: No significant change in the wound this week, but there has not been any reaccumulation of senescent epithelium or slough. The tunnel is perhaps [name] millimeter less in depth. He has been approved for Apligraf and we will apply this today. 02/04/2022: The wound has contracted somewhat and the tunnel has filled in completely. The wound surface is clean. He is here for Apligraf #2. 02/11/2022: The wound has contracted further and is now nearly flush with the surrounding skin surface. Light layer of slough. Apligraf #3 plan for today. 02/18/2022: There is [name] band of epithelium trying to cut across the superior portion of the wound. There is robust granulation tissue with just [name] light layer of slough and biofilm on the surface. There was [name] little bit of greenish drainage in the wound VAC but none appreciated on the site itself. He is here for Apligraf #4. 03/04/2022: The wound has contracted considerably. There is good granulation tissue on the surface. Minimal biofilm. He is here for Apligraf #5. 03/18/2022: The wound has epithelialized to the point that it has been divided into 2 areas. Both areas are smaller in total than at his previous visit. There is good granulation tissue present with just [name] little bit of periwound eschar and surface slough. 10/13; left medial foot. The patient has completed treatment with hyperbaric oxygen for underlying osteomyelitis he has had [name] nice response in the wound. Noted today that he still had some greenish drainage. Previously he has been treated with topical gentamicin but apparently that was  stopped 2 weeks ago. Fortunately the wound is measuring smaller 10/20; wound looks better no hypergranulation minimal drainage. Granulation tissue looks healthy using gentamicin Hydrofera Blue under compression 04/08/2022: The wound continues to contract tremendously. The response to his treatment for hypertrophic granulation tissue has been quite remarkable. There are just [name] couple of open areas remaining. 04/18/2022: His wound is healed. READMISSION 07/04/2022: He returns to clinic with [name] new ulcer adjacent to where his previous large wound had been. He says that he was wearing boots and using [name] small padded dressing to protect the freshly healed ankle wound. Apparently the dressing rolled up and rubbed on his skin, causing [name] new wound. He contacted our office last week concerned about the appearance. There is [name] small oval wound on his left medial ankle. It is fairly clean with some periwound dry skin and eschar. 07/11/2022: The wound is about the same size this week. There is [name] layer of rubbery slough on the surface. It has [name] fairly strong odor. 07/18/2022: The wound measured larger this week due to small satellite areas opening in [name] fan pattern distal to the primary wound. He continues to accumulate slough on the wound surface. The culture that I took last week only grew out low levels of skin flora. 07/25/2022: The satellite areas that had opened last week have closed. The main wound is smaller with some slough accumulation. 08/01/2022: The wound continues to contract. There is [name] layer of slough on the surface. 08/08/2022: The wound is [name] little bit smaller again this week. Still with  slough buildup. 3/18; patient presents for follow-up. He has been doing dressing changes on his own with silver alginate and antibiotic ointment. He has been at [employer] over the past 3 weeks. He has developed [name] new wound. 09/05/2022: I am not sure if the new wound that was reported last week is just the satellite  area that had closed [name] couple of weeks ago and reopened. Regardless, both wounds are quite small with good epithelialization. There is some eschar buildup around the periphery of both wounds with slough on both wound surfaces. 09/12/2022: The wounds have converged. There is some increased in depth at the distal portion. This seems to be secondary to moisture accumulation as the periwound skin is [name] bit macerated. 09/26/2022: The wound has deteriorated further. There is increased depth at the distal portion and the wound probes to bone. The patient theorizes that the boot he has been wearing is rubbing in this spot and upon inspection of the boot, there is actually [name] hard plastic boot in exactly this location, supporting his theory. 10/05/2022: The wound looks [name] little bit better this week. The area that probed to bone now has some tissue coverage. There is still some slough buildup on the wound surfaces. 10/10/2022: The wound is strongly malodorous today. There is no purulent drainage, nor any increased erythema. There is some tissue maceration around the [name], [name] ([id number]) [phone].pdf Page 4 of 11 wound, but he did not have [name] whole lot of drainage on his dressings. There is slough on the wound surface. 10/17/2022: The wound still has [name] fairly strong odor and the drainage has increased. It is goopy and purulent. The culture that I took last week returned late on Thursday with methicillin and tetracycline resistant staph epidermidis and multiple other staph species at fairly high concentrations. 10/27/2022: The wound has cleaned up considerably. There is no odor and less drainage. The drainage is no longer purulent. He is taking Bactrim as prescribed. Electronic Signature(s) Signed: 10/27/2022 3:58:36 PM By: [name] MD FACS Entered By: [name] on 10/27/2022  15:58:36 -------------------------------------------------------------------------------- Physical Exam Details Patient Name: Date of Service: NA [name]. 10/27/2022 2:45 PM Medical Record Number: [id number] Patient Account Number: [id number] Date of Birth/Sex: Treating RN: January 05, 1956 (67 y.o. M) Primary Care Provider: [name] Other Clinician: Referring Provider: Treating Provider/Extender: [name]: 16 Constitutional . . . . no acute distress. Respiratory Normal work of breathing on room air. Notes 10/27/2022: The wound has cleaned up considerably. There is no odor and less drainage. The drainage is no longer purulent. Electronic Signature(s) Signed: 10/27/2022 3:59:11 PM By: [name] MD FACS Entered By: [name] on 10/27/2022 15:59:10 -------------------------------------------------------------------------------- Physician Orders Details Patient Name: Date of Service: NA [name]. 10/27/2022 2:45 PM Medical Record Number: [id number] Patient Account Number: [id number] Date of Birth/Sex: Treating RN: [birth year] (67 y.o. [name] Primary Care Provider: [name] Other Clinician: Referring Provider: Treating Provider/Extender: [name] in Treatment: 16 Verbal / Phone Orders: No Diagnosis Coding ICD-10 Coding Code Description [phone] Non-pressure chronic ulcer of left ankle with fat layer exposed I73.9 Peripheral vascular disease, unspecified E11.622 Type 2 diabetes mellitus with other skin ulcer Follow-up Appointments ppointment in 1 week. - Dr. Lady Gary - room 2 Return [name] Anesthetic (In clinic) Topical Lidocaine 4% applied to wound bed Bathing/ Shower/ Hygiene May shower with protection but do not get wound dressing(s) wet. Protect dressing(s) with water repellant  cover (for example, large plastic bag) or [name] cast cover and may then take shower. Edema Control - Lymphedema /  SCD / Other Elevate legs to the level of the heart or above for 30 minutes daily and/or when sitting for 3-4 times [name] day throughout the day. Avoid standing for long periods of time. Off-Loading Open toe surgical shoe to: - left foot CAYLEB, TRESTER [name] [id number] Page 5 of 11 Other: - knee scooter to left leg, stop wearing your orthoboot Wound Treatment Wound #2 - Ankle Wound Laterality: Left, Medial Cleanser: Normal Saline 1 x Per Week/30 Days Discharge Instructions: Cleanse the wound with Normal Saline prior to applying [name] clean dressing using gauze sponges, not tissue or cotton balls. Peri-Wound Care: Zinc Oxide Ointment 30g tube 1 x Per Week/30 Days Discharge Instructions: Apply Zinc Oxide to periwound with each dressing change Peri-Wound Care: Sween Lotion (Moisturizing lotion) 1 x Per Week/30 Days Discharge Instructions: Apply moisturizing lotion as directed Topical: Mupirocin Ointment 1 x Per Week/30 Days Discharge Instructions: Apply Mupirocin (Bactroban) as instructed Prim Dressing: Hydrofera Blue Ready Transfer Foam, 2.5x2.5 (in/in) 1 x Per Week/30 Days ary Discharge Instructions: Apply directly to wound bed as directed Secondary Dressing: Woven Gauze Sponge, Non-Sterile 4x4 in 1 x Per Week/30 Days Discharge Instructions: Apply over primary dressing as directed. Secondary Dressing: Zetuvit Plus 4x8 in (Generic) 1 x Per Week/30 Days Discharge Instructions: Apply over primary dressing as directed. Secured With: Transpore Surgical Tape, 2x10 (in/yd) 1 x Per Week/30 Days Discharge Instructions: Secure dressing with tape as directed. Compression Wrap: Urgo K2 Lite, (equivalent to [name] 3 layer) two layer compression system, regular 1 x Per Week/30 Days Discharge Instructions: Apply Urgo K2 Lite as directed (alternative to 3 layer compression). Electronic Signature(s) Signed: 10/27/2022 4:03:18 PM By: [name] MD FACS Entered By: [name] on 10/27/2022 16:00:22 -------------------------------------------------------------------------------- Problem List Details Patient Name: Date of Service: NA [name]. 10/27/2022 2:45 PM Medical Record Number: [id number] Patient Account Number: [id number] Date of Birth/Sex: Treating RN: 1955/10/09 (67 y.o. [name] Primary Care Provider: [name] Other Clinician: Referring Provider: Treating Provider/Extender: [name]: 16 Active Problems ICD-10 Encounter Code Description Active Date MDM Diagnosis [phone] Non-pressure chronic ulcer of left ankle with fat layer exposed 07/04/2022 No Yes I73.9 Peripheral vascular disease, unspecified 07/04/2022 No Yes E11.622 Type 2 diabetes mellitus with other skin ulcer 07/04/2022 No Yes Inactive Problems Resolved Problems Electronic Signature(s) Signed: 10/27/2022 3:56:40 PM By: [name] MD FACS Entered By: [name] on 10/27/2022 15:56:39 [name] ([id number]) [id number] Page 6 of 11 -------------------------------------------------------------------------------- Progress Note Details Patient Name: Date of Service: NA [name] 10/27/2022 2:45 PM Medical Record Number: [id number] Patient Account Number: [id number] Date of Birth/Sex: Treating RN: 10-13-1955 (67 y.o. M) Primary Care Provider: [name] Other Clinician: Referring Provider: Treating Provider/Extender: [name] in Treatment: 16 Subjective Chief Complaint Information obtained from Patient Patients presents for treatment of an open diabetic ulcer with exposed bone and osteomyelitis 07/04/2022: Diabetic ankle ulcer History of Present Illness (HPI) ADMISSION 08/25/2021 This is [name] 67 year old man who initially presented to his primary care provider in September 2022 with pain in his left foot. He was sent for an x-ray and while the x-ray was  being performed, the tech pointed out [name] wound on his foot that the patient was not aware existed. He does have type 2 diabetes with significant neuropathy. His diabetes is suboptimally  controlled with his most recent A1c being 8.5. He also has [name] history of coronary artery disease status post three- vessel CABG. he was initially seen by orthopedics, but they referred him to [facility]. He has undergone at least 7 operations/debridements and several applications of skin substitute under the care of podiatry. He has been in [name] for much of this time. His most recent procedure was July 28, 2021. [name] was biopsied and was found to be consistent with osteomyelitis. Culture also returned positive for corynebacterium. He was seen on August 16, 2021 by infectious disease. [name] PICC line has been placed and he will be receiving [name] 6-week course of IV daptomycin and cefepime. In October 2022, he underwent lower extremity vascular studies. Results are copied here: Right: Resting right ankle-brachial index is within normal range. No evidence of significant right lower extremity arterial disease. The right toe-brachial index is abnormal. Left: Resting left ankle-brachial index indicates mild left lower extremity arterial disease. The left toe-brachial index is abnormal. He has not been seen by vascular surgery despite these findings. He presented to clinic today in [name] and is using [name] to offload. Wound VAC was in place. Once this was removed, [name] large ulcer was identified on the left midfoot/ankle. Bone is frankly exposed. There is no malodorous or purulent drainage. There is some granulation tissue over the central portion of the exposed bone. There is [name] tunnel that extends posteriorly for roughly 10 cm. It has been discussed with him by multiple providers that he is at very high risk of losing his lower leg because of this wound. He is extremely eager  to avoid this outcome and is here today to review his options as well as receive ongoing wound care. 09/03/2021: Here for reevaluation of his wound. There does not appear to have been any substantial improvement overall since our last visit. He has been in [name] with white foam overlying the exposed bone. We are working on getting him approved for hyperbaric oxygen therapy. 09/10/2021: We are in the process of getting him cleared to begin hyperbaric oxygen therapy. He still needs to obtain [name] chest x-ray. Although the wound measurements are roughly the same, I think the overall appearance of the wound is better. The exposed bone has [name] bit more granulation tissue covering it. He has not received [name] vascular surgery appointment to reevaluate his flow to the wound. 09/17/2021: He has been approved for hyperbaric oxygen therapy and completed his chest x-ray, which I reviewed and it appears normal. The tunnels at the 12 and 10:00 positions are smaller. There is more granulation tissue covering the exposed bone and the undermining has decreased. He still has not received [name] vascular surgery appointment. 09/24/2021: He initiated hyperbaric oxygen therapy this week and is tolerating it well. He has an appointment with vascular surgery coming up on May 16. The granulation tissue is covering more of the exposed bone and both tunnels are [name] bit smaller. 10/01/2021: He continues to tolerate hyperbaric oxygen therapy. He saw infectious disease and they are planning to pull his PICC line. He has been initiated on oral antibiotics (doxycycline and Augmentin). The wound looks about the same but the tunnels are [name] little bit smaller. The skin seems to be contracting somewhat around the exposed bone. 10/08/2021: The wound is still about the same size, but the tunnels continue to come in and the skin is contracting around the  exposed bone. He continues to have some accumulation of necrotic material in the inferoposterior  aspect of the wound as well as accumulation at the 12:00 tunnel area. 10/15/2021: The wound is smaller today. The tunnels continue to come in. There is less necrotic tissue present. He does have some periwound maceration. 10/22/2021: The wound is about the same size. There is [name] little bit less undermining at the distal portion. The exposed bone is dark and I am not sure if this is staining from silver nitrate or his VAC sponge or if it represents necrosis. The tunnels are shallower but he does have some serous drainage coming from the 10:00 tunnel. He continues to tolerate hyperbaric oxygen therapy well. 10/29/2021: The undermining continues to improve. The tunnels are about the same. He has good granulation tissue overlying the majority of the exposed bone. It does appear that perhaps the tubing from his wound VAC has been eroding the skin at the 12 clock position. He continues to accumulate senescent epithelium around the borders of the wound. 11/05/2021: The undermining is almost completely resolved. The tunnels have contracted fairly significantly. No significant slough or debris accumulation. There is still senescent epithelium accumulation around the borders of the wound. He has been tolerating hyperbaric oxygen therapy well. 11/12/2021: Despite the measurements of the wound being about the same, the wound has changed in its shape and overall, I think it is improved. The undermining has resolved and the tunnels continue to shorten. There is good granulation tissue encroaching over the small area of bone that has remained exposed at the 12 o'clock position. Minimal slough accumulation. He continues to tolerate hyperbaric oxygen therapy well. 11/19/2021: I took [name] PCR culture last week. There was overgrowth of yeast. He is already taking suppressive doxycycline and Augmentin. I added fluconazole to his regimen. The wound is smaller again today. The tunnels continue to shorten. He continues to do well with  hyperbaric oxygen therapy. [name], [name] [id number] Page 7 of 11 11/26/2021: For some reason, his foot has become macerated. The wound is narrower but about the same dimensions in its longitudinal aspect. The tunnels continue to shorten. He has some slough buildup on the wound as well as some heaped up senescent epithelium around the perimeter. 12/03/2021: No further maceration of his foot has occurred. The wound has contracted quite significantly from last week. The tunnel at 10:00 is closed. The tunnel at 12:00 is down to just [name] couple of millimeters. No other undermining is present. There is soft tissue coverage of the previously exposed bone. There is just [name] bit of slough and biofilm on the wound surface. 12/10/2021: The wound is looking good. It turns out the tunnel at 12:00 is only exposed when the patient dorsiflexes his foot. It is about 2 cm in depth when he does this; when his foot is in plantarflexion, the tunnel is closed. The bone that was visible at the 12:00 tunnel is completely covered with granulation tissue, but there does feel like some exposed bone deeper into the tunnel area. There is senescent skin heaped up around the periphery. Minimal slough on the wound surface. 12/16/2021: The wound dimensions are roughly the same. The surface has nice granulation tissue. The exposed bone at the 12:00 tunnel continues to be covered with more soft tissue. 7/14; patient's wound measures smaller today. Using the wound VAC with underlying collagen. He is also being treated with HBO for underlying osteomyelitis. He tells me he is on doxycycline and ampicillin follows with  infectious disease next week 12/31/2021: The wound continues to contract. Unfortunately, the area where the track pad and tubing have been rubbing continues to look like it is applying friction. He says that the home health nurses that have been applying the [facility] have been putting gauze  underneath the tubing, but nonetheless there is ongoing tissue breakdown at this site. Light slough accumulation on the wound surface. The tunnel continues to contract. He is tolerating HBO without difficulty. 01/07/2022: Bridging the wound VAC away from the ankle has resulted in significant improvement in the tissue at the apex of the wound. The tunnel is still present and is not all that much shorter, but the overall wound surface is very robust and healthy looking. Minimal slough accumulation. No concern for acute infection. 01/21/2022: The wound continues to contract and has [name] robust granulation tissue surface. The tunnel has come in considerably and is down to about 1.4 cm. There is still bone exposed within the tunnel but the rest of it is well covered. There is some senescent epithelium at the wound margins and [name] little bit of slough on the surface. 01/28/2022: No significant change in the wound this week, but there has not been any reaccumulation of senescent epithelium or slough. The tunnel is perhaps [name] millimeter less in depth. He has been approved for Apligraf and we will apply this today. 02/04/2022: The wound has contracted somewhat and the tunnel has filled in completely. The wound surface is clean. He is here for Apligraf #2. 02/11/2022: The wound has contracted further and is now nearly flush with the surrounding skin surface. Light layer of slough. Apligraf #3 plan for today. 02/18/2022: There is [name] band of epithelium trying to cut across the superior portion of the wound. There is robust granulation tissue with just [name] light layer of slough and biofilm on the surface. There was [name] little bit of greenish drainage in the wound VAC but none appreciated on the site itself. He is here for Apligraf #4. 03/04/2022: The wound has contracted considerably. There is good granulation tissue on the surface. Minimal biofilm. He is here for Apligraf #5. 03/18/2022: The wound has epithelialized to the point that  it has been divided into 2 areas. Both areas are smaller in total than at his previous visit. There is good granulation tissue present with just [name] little bit of periwound eschar and surface slough. 10/13; left medial foot. The patient has completed treatment with hyperbaric oxygen for underlying osteomyelitis he has had [name] nice response in the wound. Noted today that he still had some greenish drainage. Previously he has been treated with topical gentamicin but apparently that was stopped 2 weeks ago. Fortunately the wound is measuring smaller 10/20; wound looks better no hypergranulation minimal drainage. Granulation tissue looks healthy using gentamicin Hydrofera Blue under compression 04/08/2022: The wound continues to contract tremendously. The response to his treatment for hypertrophic granulation tissue has been quite remarkable. There are just [name] couple of open areas remaining. 04/18/2022: His wound is healed. READMISSION 07/04/2022: He returns to clinic with [name] new ulcer adjacent to where his previous large wound had been. He says that he was wearing boots and using [name] small padded dressing to protect the freshly healed ankle wound. Apparently the dressing rolled up and rubbed on his skin, causing [name] new wound. He contacted our office last week concerned about the appearance. There is [name] small oval wound on his left medial ankle. It is fairly clean with some periwound dry skin  and eschar. 07/11/2022: The wound is about the same size this week. There is [name] layer of rubbery slough on the surface. It has [name] fairly strong odor. 07/18/2022: The wound measured larger this week due to small satellite areas opening in [name] fan pattern distal to the primary wound. He continues to accumulate slough on the wound surface. The culture that I took last week only grew out low levels of skin flora. 07/25/2022: The satellite areas that had opened last week have closed. The main wound is smaller with some slough  accumulation. 08/01/2022: The wound continues to contract. There is [name] layer of slough on the surface. 08/08/2022: The wound is [name] little bit smaller again this week. Still with slough buildup. 3/18; patient presents for follow-up. He has been doing dressing changes on his own with silver alginate and antibiotic ointment. He has been at [employer] over the past 3 weeks. He has developed [name] new wound. 09/05/2022: I am not sure if the new wound that was reported last week is just the satellite area that had closed [name] couple of weeks ago and reopened. Regardless, both wounds are quite small with good epithelialization. There is some eschar buildup around the periphery of both wounds with slough on both wound surfaces. 09/12/2022: The wounds have converged. There is some increased in depth at the distal portion. This seems to be secondary to moisture accumulation as the periwound skin is [name] bit macerated. 09/26/2022: The wound has deteriorated further. There is increased depth at the distal portion and the wound probes to bone. The patient theorizes that the boot he has been wearing is rubbing in this spot and upon inspection of the boot, there is actually [name] hard plastic boot in exactly this location, supporting his theory. 10/05/2022: The wound looks [name] little bit better this week. The area that probed to bone now has some tissue coverage. There is still some slough buildup on the wound surfaces. 10/10/2022: The wound is strongly malodorous today. There is no purulent drainage, nor any increased erythema. There is some tissue maceration around the wound, but he did not have [name] whole lot of drainage on his dressings. There is slough on the wound surface. [name], [name] [id number] Page 8 of 11 10/17/2022: The wound still has [name] fairly strong odor and the drainage has increased. It is goopy and purulent. The culture that I took last week returned late on Thursday with  methicillin and tetracycline resistant staph epidermidis and multiple other staph species at fairly high concentrations. 10/27/2022: The wound has cleaned up considerably. There is no odor and less drainage. The drainage is no longer purulent. He is taking Bactrim as prescribed. Patient History Information obtained from Patient. Family History Cancer - Father, Diabetes - Father,Mother,Paternal Grandparents, Heart Disease - Father, Hypertension - Father, No family history of Hereditary Spherocytosis, Kidney Disease, Lung Disease, Seizures, Stroke, Thyroid Problems, Tuberculosis. Social History Never smoker, Marital Status - Married, Alcohol Use - Rarely, Drug Use - No History, Caffeine Use - Daily. Medical History Eyes Patient has history of Cataracts - Removed 2008 Cardiovascular Patient has history of Coronary Artery Disease, Hypertension, Myocardial Infarction, Peripheral Arterial Disease Endocrine Patient has history of Type II Diabetes Musculoskeletal Patient has history of Osteomyelitis Neurologic Patient has history of Neuropathy Medical [name] Surgical History Notes nd Cardiovascular Hypercholesterolemia Abnormal EKG CABG X3 2019 Gastrointestinal GERD Musculoskeletal Diabetic foot ulcer Objective Constitutional no acute distress. Vitals Time Taken: 2:59 AM, Height: 74 in, Weight: 187 lbs, BMI: 24,  Temperature: 98.0 F, Pulse: 79 bpm, Respiratory Rate: 20 breaths/min, Blood Pressure: 127/80 mmHg. Respiratory Normal work of breathing on room air. General Notes: 10/27/2022: The wound has cleaned up considerably. There is no odor and less drainage. The drainage is no longer purulent. Integumentary (Hair, Skin) Wound #2 status is Open. Original cause of wound was Pressure Injury. The date acquired was: 06/13/2022. The wound has been in treatment 16 weeks. The wound is located on the Left,Medial Ankle. The wound measures 1.8cm length x 1.5cm width x 0.5cm depth; 2.121cm^2 area and  1.06cm^3 volume. There is bone and Fat Layer (Subcutaneous Tissue) exposed. There is no tunneling or undermining noted. There is [name] medium amount of purulent drainage noted. Foul odor after cleansing was noted. The wound margin is well defined and not attached to the wound base. There is large (67-100%) pink, pale granulation within the wound bed. There is [name] small (1-33%) amount of necrotic tissue within the wound bed including Adherent Slough. The periwound skin appearance had no abnormalities noted for color. The periwound skin appearance exhibited: Callus, Maceration. The periwound skin appearance did not exhibit: Dry/Scaly. Periwound temperature was noted as No Abnormality. Assessment Active Problems ICD-10 Non-pressure chronic ulcer of left ankle with fat layer exposed Peripheral vascular disease, unspecified Type 2 diabetes mellitus with other skin ulcer Procedures [name], [name] ([id number]) [phone].pdf Page 9 of 11 Wound #2 Pre-procedure diagnosis of Wound #2 is [name] Diabetic Wound/Ulcer of the Lower Extremity located on the Left,Medial Ankle .Severity of Tissue Pre Debridement is: Fat layer exposed. There was [name] Excisional Skin/Subcutaneous Tissue Debridement with [name] total area of 2.12 sq cm performed by [name], MD. With the following instrument(s): Curette Material removed includes Subcutaneous Tissue and Slough and after achieving pain control using Lidocaine 4% Topical Solution. No specimens were taken. [name] time out was conducted at 15:41, prior to the start of the procedure. [name] Minimum amount of bleeding was controlled with Pressure. The procedure was tolerated well with [name] pain level of 0 throughout and [name] pain level of 0 following the procedure. Post Debridement Measurements: 1.8cm length x 1.5cm width x 0.5cm depth; 1.06cm^3 volume. Character of Wound/Ulcer Post Debridement is improved. Severity of Tissue Post Debridement is: Fat layer exposed. Post  procedure Diagnosis Wound #2: Same as Pre-Procedure General Notes: Scribed for Dr Lady Gary by Brenton Grills, RN.[name] Kitchen Plan Follow-up Appointments: Return Appointment in 1 week. - Dr. Lady Gary - room 2 Anesthetic: (In clinic) Topical Lidocaine 4% applied to wound bed Bathing/ Shower/ Hygiene: May shower with protection but do not get wound dressing(s) wet. Protect dressing(s) with water repellant cover (for example, large plastic bag) or [name] cast cover and may then take shower. Edema Control - Lymphedema / SCD / Other: Elevate legs to the level of the heart or above for 30 minutes daily and/or when sitting for 3-4 times [name] day throughout the day. Avoid standing for long periods of time. Off-Loading: Open toe surgical shoe to: - left foot Other: - knee scooter to left leg, stop wearing your orthoboot WOUND #2: - Ankle Wound Laterality: Left, Medial Cleanser: Normal Saline 1 x Per Week/30 Days Discharge Instructions: Cleanse the wound with Normal Saline prior to applying [name] clean dressing using gauze sponges, not tissue or cotton balls. Peri-Wound Care: Zinc Oxide Ointment 30g tube 1 x Per Week/30 Days Discharge Instructions: Apply Zinc Oxide to periwound with each dressing change Peri-Wound Care: Sween Lotion (Moisturizing lotion) 1 x Per Week/30 Days Discharge Instructions: Apply moisturizing lotion as directed  Topical: Mupirocin Ointment 1 x Per Week/30 Days Discharge Instructions: Apply Mupirocin (Bactroban) as instructed Prim Dressing: Hydrofera Blue Ready Transfer Foam, 2.5x2.5 (in/in) 1 x Per Week/30 Days ary Discharge Instructions: Apply directly to wound bed as directed Secondary Dressing: Woven Gauze Sponge, Non-Sterile 4x4 in 1 x Per Week/30 Days Discharge Instructions: Apply over primary dressing as directed. Secondary Dressing: Zetuvit Plus 4x8 in (Generic) 1 x Per Week/30 Days Discharge Instructions: Apply over primary dressing as directed. Secured With: Transpore Surgical T ape, 2x10  (in/yd) 1 x Per Week/30 Days Discharge Instructions: Secure dressing with tape as directed. Com pression Wrap: Urgo K2 Lite, (equivalent to [name] 3 layer) two layer compression system, regular 1 x Per Week/30 Days Discharge Instructions: Apply Urgo K2 Lite as directed (alternative to 3 layer compression). 10/27/2022: The wound has cleaned up considerably. There is no odor and less drainage. The drainage is no longer purulent. I used [name] curette to debride slough and subcutaneous tissue from the wound. We will continue topical mupirocin with Hydrofera Blue ready foam. This will need to be packed down into the deeper aspect of the wound. Continue 3 layer compression. He will complete his course of oral Bactrim. His appointment with vascular surgery got rescheduled for June 6. Follow-up with me in 1 week. Electronic Signature(s) Signed: 10/27/2022 4:02:20 PM By: [name] MD FACS Entered By: [name] on 10/27/2022 16:02:20 -------------------------------------------------------------------------------- HxROS Details Patient Name: Date of Service: NA [name]. 10/27/2022 2:45 PM Medical Record Number: [id number] Patient Account Number: [id number] Date of Birth/Sex: Treating RN: 26-May-1956 (67 y.o. M) Primary Care Provider: [name] Other Clinician: Referring Provider: Treating Provider/Extender: [name]: 16 Information Obtained From Patient Eyes Medical History: [name], [name] ([id number]) [id number] Page 10 of 11 Positive for: Cataracts - Removed 2008 Cardiovascular Medical History: Positive for: Coronary Artery Disease; Hypertension; Myocardial Infarction; Peripheral Arterial Disease Past Medical History Notes: Hypercholesterolemia Abnormal EKG CABG X3 2019 Gastrointestinal Medical History: Past Medical History Notes: GERD Endocrine Medical History: Positive for: Type II Diabetes Time with  diabetes: 24 years Treated with: Insulin, Oral agents Blood sugar tested every day: Yes Tested : Musculoskeletal Medical History: Positive for: Osteomyelitis Past Medical History Notes: Diabetic foot ulcer Neurologic Medical History: Positive for: Neuropathy HBO Extended History Items Eyes: Cataracts Immunizations Pneumococcal Vaccine: Received Pneumococcal Vaccination: Yes Received Pneumococcal Vaccination On or After 60th Birthday: Yes Implantable Devices Yes Family and Social History Cancer: Yes - Father; Diabetes: Yes - Father,Mother,Paternal Grandparents; Heart Disease: Yes - Father; Hereditary Spherocytosis: No; Hypertension: Yes - Father; Kidney Disease: No; Lung Disease: No; Seizures: No; Stroke: No; Thyroid Problems: No; Tuberculosis: No; Never smoker; Marital Status - Married; Alcohol Use: Rarely; Drug Use: No History; Caffeine Use: Daily; Financial Concerns: No; Food, Clothing or Shelter Needs: No; Support System Lacking: No; Transportation Concerns: No Electronic Signature(s) Signed: 10/27/2022 4:03:18 PM By: [name] MD FACS Entered By: [name] on 10/27/2022 15:58:43 -------------------------------------------------------------------------------- SuperBill Details Patient Name: Date of Service: NA [name]. 10/27/2022 Medical Record Number: [id number] Patient Account Number: [id number] Date of Birth/Sex: Treating RN: 1955/06/21 (67 y.o. [name] Primary Care Provider: [name] Other Clinician: Referring Provider: Treating Provider/Extender: [name]: 16 Diagnosis Coding ICD-10 Codes YACOB, CORTES [name] ([id number]) [id number] Page 11 of 11 Code Description L97.322 Non-pressure chronic ulcer of left ankle with fat layer exposed I73.9 Peripheral vascular disease, unspecified E11.622 Type 2 diabetes mellitus with other skin ulcer  Facility Procedures : CPT4 Code:  [id number] Description: 11042 - DEB SUBQ TISSUE 20 SQ CM/< ICD-10 Diagnosis Description L97.322 Non-pressure chronic ulcer of left ankle with fat layer exposed Modifier: Quantity: 1 Physician Procedures : CPT4 Code Description Modifier [id number] - WC PHYS LEVEL 4 - EST PT 25 ICD-10 Diagnosis Description L97.322 Non-pressure chronic ulcer of left ankle with fat layer exposed I73.9 Peripheral vascular disease, unspecified E11.622 Type 2 diabetes  mellitus with other skin ulcer Quantity: 1 : [id number] 11042 - WC PHYS SUBQ TISS 20 SQ CM ICD-10 Diagnosis Description L97.322 Non-pressure chronic ulcer of left ankle with fat layer exposed Quantity: 1 Electronic Signature(s) Signed: 10/27/2022 4:02:35 PM By: [name] MD FACS Entered By: [name] on 10/27/2022 16:02:34

## 2022-11-28 ENCOUNTER — Encounter (HOSPITAL_BASED_OUTPATIENT_CLINIC_OR_DEPARTMENT_OTHER): Payer: BC Managed Care – PPO | Admitting: General Surgery

## 2022-11-28 DIAGNOSIS — L97522 Non-pressure chronic ulcer of other part of left foot with fat layer exposed: Secondary | ICD-10-CM | POA: Diagnosis not present

## 2022-11-28 DIAGNOSIS — L97324 Non-pressure chronic ulcer of left ankle with necrosis of bone: Secondary | ICD-10-CM | POA: Diagnosis not present

## 2022-11-28 DIAGNOSIS — E1151 Type 2 diabetes mellitus with diabetic peripheral angiopathy without gangrene: Secondary | ICD-10-CM | POA: Diagnosis not present

## 2022-11-28 DIAGNOSIS — E11622 Type 2 diabetes mellitus with other skin ulcer: Secondary | ICD-10-CM | POA: Diagnosis not present

## 2022-11-28 DIAGNOSIS — L97322 Non-pressure chronic ulcer of left ankle with fat layer exposed: Secondary | ICD-10-CM | POA: Diagnosis not present

## 2022-11-28 NOTE — Progress Notes (Signed)
FYODOR, KRUZAN [name] ([id number]) [id number] Page 1 of 7 Visit Report for 11/28/2022 Arrival Information Details Patient Name: Date of Service: NA [name]. 11/28/2022 3:00 PM Medical Record Number: [id number] Patient Account Number: [id number] Date of Birth/Sex: Treating RN: [birth year] (67 y.o. [name] Primary Care [name]: [name] Other Clinician: Referring [name]: Treating [name]: [name]: 21 Visit Information History Since Last Visit Added or deleted any medications: No Patient Arrived: Knee Scooter Any new allergies or adverse reactions: No Arrival Time: 14:46 Had [name] fall or experienced change in No Accompanied By: self activities of daily living that may affect Transfer Assistance: None risk of falls: Patient Identification Verified: Yes Signs or symptoms of abuse/neglect since last visito No Secondary Verification Process Completed: Yes Hospitalized since last visit: No Patient Requires Transmission-Based Precautions: No Implantable device outside of the clinic excluding No Patient Has Alerts: No cellular tissue based products placed in the center since last visit: Has Dressing in Place as Prescribed: Yes Has Compression in Place as Prescribed: Yes Pain Present Now: No Electronic Signature(s) Signed: 11/28/2022 3:30:01 PM By: [name] Entered By: [name] on 11/28/2022 14:47:17 -------------------------------------------------------------------------------- Compression Therapy Details Patient Name: Date of Service: [name]. 11/28/2022 3:00 PM Medical Record Number: [id number] Patient Account Number: [id number] Date of Birth/Sex: Treating RN: [birth year] (67 y.o. [name] Primary Care [name]: [name] Other Clinician: Referring Rosangelica Pevehouse: Treating [name]/Extender: [name] Weeks in [name]:  21 Compression Therapy Performed for Wound Assessment: Wound #2 Left,Medial Ankle Performed By: Clinician Colin Bruin, RN Compression Type: Double Layer Post Procedure Diagnosis Same as Pre-procedure Electronic Signature(s) Signed: 11/28/2022 3:30:01 PM By: [name] Entered By: [name] on 11/28/2022 15:01:00 [name] ([id number]) [id number] Page 2 of 7 -------------------------------------------------------------------------------- Encounter Discharge Information Details Patient Name: Date of Service: NA [name]. 11/28/2022 3:00 PM Medical Record Number: [id number] Patient Account Number: [id number] Date of Birth/Sex: Treating RN: 1956/03/20 (67 y.o. [name] Primary Care Kassondra Geil: [name] Other Clinician: Referring [name]: Treating [name]: [name]: 21 Encounter Discharge Information Items Post Procedure Vitals Discharge Condition: Stable Temperature (F): 97.9 Ambulatory Status: Knee Scooter Pulse (bpm): 84 Discharge Destination: Home Respiratory Rate (breaths/min): 18 Transportation: Private Auto Blood Pressure (mmHg): 159/84 Accompanied By: self Schedule Follow-up Appointment: Yes Clinical Summary of Care: Patient Declined Electronic Signature(s) Signed: 11/28/2022 3:30:01 PM By: [name] Entered By: [name] on 11/28/2022 15:14:25 -------------------------------------------------------------------------------- Lower Extremity Assessment Details Patient Name: Date of Service: [name]. 11/28/2022 3:00 PM Medical Record Number: [id number] Patient Account Number: [id number] Date of Birth/Sex: Treating RN: 1955/10/11 (67 y.o. [name] Primary Care Gautham Hewins: [name] Other Clinician: Referring [name]: Treating [name]/Extender: [name] Weeks in [name]: 21 Edema  Assessment Assessed: [Left: No] [Right: No] Edema: [Left: Ye] [Right: s] Calf Left: Right: Point of Measurement: From Medial Instep 34 cm Ankle Left: Right: Point of Measurement: From Medial Instep 22 cm Vascular Assessment Pulses: Dorsalis Pedis Palpable: [Left:Yes] Electronic Signature(s) Signed: 11/28/2022 3:30:01 PM By: [name] Entered By: [name] on 11/28/2022 14:52:41 Multi Wound Chart Details -------------------------------------------------------------------------------- [name] ([id number]) [id number] Page 3 of 7 Patient Name: Date of Service: NA [name]. 11/28/2022 3:00 PM Medical Record Number: [id number] Patient Account Number: [id number] Date of Birth/Sex: Treating RN: 1955/10/22 (67 y.o. M) Primary Care [name]: [name] Other Clinician: Referring [name]:  Treating [name]/Extender: [name] Weeks in [name]: 21 Vital Signs Height(in): 74 Capillary Blood Glucose(mg/dl): 098 Weight(lbs): 119 Pulse(bpm): 84 Body Mass Index(BMI): 24 Blood Pressure(mmHg): 159/84 Temperature(F): 97.9 Respiratory Rate(breaths/min): 18 Wound Assessments Wound Number: 2 Colin/[name] Colin/[name] Photos: Colin/[name] Colin/[name] Left, Medial Ankle Colin/[name] Colin/[name] Wound Location: Pressure Injury Colin/[name] Colin/[name] Wounding Event: Diabetic Wound/Ulcer of the Lower Colin/[name] Colin/[name] Primary Etiology: Extremity Cataracts, Coronary Artery Disease, Colin/[name] Colin/[name] Comorbid History: Hypertension, Myocardial Infarction, Peripheral Arterial Disease, Type II Diabetes, Osteomyelitis, Neuropathy 06/13/2022 Colin/[name] Colin/[name] Date Acquired: 21 Colin/[name] Colin/[name] Weeks of [name]: Open Colin/[name] Colin/[name] Wound Status: No Colin/[name] Colin/[name] Wound Recurrence: 1x1.5x0.5 Colin/[name] Colin/[name] Measurements L x W x D (cm) 1.178 Colin/[name] Colin/[name] [name] (cm) : rea 0.589 Colin/[name] Colin/[name] Volume (cm) : 42.30% Colin/[name] Colin/[name] % Reduction in [name] rea: -188.70% Colin/[name] Colin/[name] % Reduction in Volume: 6 Position 1 (o'clock): 0.8 Maximum Distance 1 (cm): Yes Colin/[name]  Colin/[name] Tunneling: Grade 3 Colin/[name] Colin/[name] Classification: Medium Colin/[name] Colin/[name] Exudate [name] mount: Serosanguineous Colin/[name] Colin/[name] Exudate Type: red, brown Colin/[name] Colin/[name] Exudate Color: Distinct, outline attached Colin/[name] Colin/[name] Wound Margin: Large (67-100%) Colin/[name] Colin/[name] Granulation [name] mount: Red Colin/[name] Colin/[name] Granulation Quality: Small (1-33%) Colin/[name] Colin/[name] Necrotic [name] mount: Fat Layer (Subcutaneous Tissue): Yes Colin/[name] Colin/[name] Exposed Structures: Fascia: No Tendon: No Muscle: No Joint: No Bone: No Small (1-33%) Colin/[name] Colin/[name] Epithelialization: No Abnormalities Noted Colin/[name] Colin/[name] Periwound Skin Texture: Maceration: Yes Colin/[name] Colin/[name] Periwound Skin Moisture: No Abnormalities Noted Colin/[name] Colin/[name] Periwound Skin Color: No Abnormality Colin/[name] Colin/[name] Temperature: [name] Notes Electronic Signature(s) Signed: 11/28/2022 2:58:17 PM By: [name] MD FACS Entered By: [name] on 11/28/2022 14:58:16 [name] ([id number]) [id number] Page 4 of 7 -------------------------------------------------------------------------------- Multi-Disciplinary Care Plan Details Patient Name: Date of Service: NA [name]. 11/28/2022 3:00 PM Medical Record Number: [id number] Patient Account Number: [id number] Date of Birth/Sex: Treating RN: 25-Apr-1956 (67 y.o. [name] Primary Care Olof Marcil: [name] Other Clinician: Referring Jamauri Kruzel: Treating Laranda Burkemper/Extender: [name]: 21 Multidisciplinary Care Plan reviewed with physician Active Inactive Pressure Nursing Diagnoses: Knowledge deficit related to causes and risk factors for pressure ulcer development Knowledge deficit related to management of pressures ulcers Goals: Patient will remain free of pressure ulcers Date Initiated: 07/04/2022 Target Resolution Date: 12/09/2022 Goal Status: Active Interventions: Assess: immobility, friction, shearing, incontinence upon admission and as needed Assess potential for pressure ulcer  upon admission and as needed [name] Activities: Patient referred for pressure reduction/relief devices : 07/04/2022 Notes: Electronic Signature(s) Signed: 11/28/2022 3:30:01 PM By: [name] Entered By: [name] on 11/28/2022 15:00:48 -------------------------------------------------------------------------------- Pain Assessment Details Patient Name: Date of Service: NA [name]. 11/28/2022 3:00 PM Medical Record Number: [id number] Patient Account Number: [id number] Date of Birth/Sex: Treating RN: December 16, 1955 (67 y.o. [name] Primary Care Ottie Neglia: [name] Other Clinician: Referring Waverly Chavarria: Treating Elisabel Hanover/Extender: [name] Weeks in [name]: 21 Active Problems Location of Pain Severity and Description of Pain Patient Has Paino No Site Locations Rate the pain. REICHEN, [name] ([id number]) [id number] Page 5 of 7 Rate the pain. Current Pain Level: 0 Pain Management and Medication Current Pain Management: Electronic Signature(s) Signed: 11/28/2022 3:30:01 PM By: [name] Entered By: [name] on 11/28/2022 14:47:41 -------------------------------------------------------------------------------- Patient/Caregiver Education Details Patient Name: Date of Service: NA [name] 6/17/2024andnbsp3:00 PM Medical Record Number: [id number] Patient Account Number: [id number] Date of Birth/Gender: Treating RN: [birth year] (67 y.o. [name] Primary Care Physician: [name] Other Clinician:  Referring Physician: Treating Physician/Extender: [name]: 21 Education Assessment Education Provided To: Patient Education Topics Provided Wound/Skin Impairment: Methods: Explain/Verbal Responses: Reinforcements needed, State content correctly Electronic Signature(s) Signed: 11/28/2022 3:30:01 PM By: [name] Entered  By: [name] on 11/28/2022 15:01:25 -------------------------------------------------------------------------------- Wound Assessment Details Patient Name: Date of Service: NA [name]. 11/28/2022 3:00 PM Medical Record Number: [id number] Patient Account Number: [id number] Date of Birth/Sex: Treating RN: [birth year] (66 y.o. [name] Primary Care Nathaneal Sommers: [name] Other Clinician: Referring Elenore Wanninger: Treating Annjanette Wertenberger/Extender: [name], [name] ([id number]) [id number] Page 6 of 7 Weeks in [name]: 21 Wound Status Wound Number: 2 Primary Diabetic Wound/Ulcer of the Lower Extremity Etiology: Wound Location: Left, Medial Ankle Wound Open Wounding Event: Pressure Injury Status: Date Acquired: 06/13/2022 Comorbid Cataracts, Coronary Artery Disease, Hypertension, Myocardial Weeks Of [name]: 21 History: Infarction, Peripheral Arterial Disease, Type II Diabetes, Clustered Wound: No Osteomyelitis, Neuropathy Photos Wound Measurements Length: (cm) 1 Width: (cm) 1.5 Depth: (cm) 0.5 Area: (cm) 1.178 Volume: (cm) 0.589 % Reduction in Area: 42.3% % Reduction in Volume: -188.7% Epithelialization: Small (1-33%) Tunneling: Yes Position (o'clock): 6 Maximum Distance: (cm) 0.8 Undermining: No Wound Description Classification: Grade 3 Wound Margin: Distinct, outline attached Exudate Amount: Medium Exudate Type: Serosanguineous Exudate Color: red, brown Foul Odor After Cleansing: No Slough/Fibrino Yes Wound Bed Granulation Amount: Large (67-100%) Exposed Structure Granulation Quality: Red Fascia Exposed: No Necrotic Amount: Small (1-33%) Fat Layer (Subcutaneous Tissue) Exposed: Yes Necrotic Quality: Adherent Slough Tendon Exposed: No Muscle Exposed: No Joint Exposed: No Bone Exposed: No Periwound Skin Texture Texture Color No Abnormalities Noted: Yes No Abnormalities Noted: Yes Moisture  Temperature / Pain No Abnormalities Noted: No Temperature: No Abnormality Maceration: Yes [name] Notes Wound #2 (Ankle) Wound Laterality: Left, Medial Cleanser Normal Saline Discharge Instruction: Cleanse the wound with Normal Saline prior to applying [name] clean dressing using gauze sponges, not tissue or cotton balls. Peri-Wound Care Zinc Oxide Ointment 30g tube Discharge Instruction: Apply Zinc Oxide to periwound with each dressing change Sween Lotion (Moisturizing lotion) Discharge Instruction: Apply moisturizing lotion as directed Topical ARVIN, RAUB [name] ([id number]) [phone].pdf Page 7 of 7 Gentamicin Discharge Instruction: As directed by physician Mupirocin Ointment Discharge Instruction: Apply Mupirocin (Bactroban) as instructed Primary Dressing Hydrofera Blue Ready Transfer Foam, 2.5x2.5 (in/in) Discharge Instruction: Apply directly to wound bed as directed Secondary Dressing Woven Gauze Sponge, Non-Sterile 4x4 in Discharge Instruction: Apply over primary dressing as directed. Zetuvit Plus 4x8 in Discharge Instruction: Apply over primary dressing as directed. Secured With Transpore Surgical Tape, 2x10 (in/yd) Discharge Instruction: Secure dressing with tape as directed. Compression Wrap Urgo K2 Lite, (equivalent to [name] 3 layer) two layer compression system, regular Discharge Instruction: Apply Urgo K2 Lite as directed (alternative to 3 layer compression). Compression Stockings Add-Ons Electronic Signature(s) Signed: 11/28/2022 3:30:01 PM By: [name] Entered By: [name] on 11/28/2022 14:56:01 -------------------------------------------------------------------------------- Vitals Details Patient Name: Date of Service: NA RRO Dorris [name], [name]. 11/28/2022 3:00 PM Medical Record Number: [id number] Patient Account Number: [id number] Date of Birth/Sex: Treating RN: Sep 01, 1955 (67 y.o. [name] Primary Care Gabryella Murfin: [name] Other Clinician: Referring Dannell Gortney: Treating Yvonne Petite/Extender: [name] Weeks in [name]: 21 Vital Signs Time Taken: 14:47 Temperature (F): 97.9 Height (in): 74 Pulse (bpm): 84 Weight (lbs): 187 Respiratory Rate (breaths/min): 18 Body Mass Index (BMI): 24 Blood Pressure (mmHg): 159/84 Capillary Blood Glucose (mg/dl): 595 Reference Range: 80 - 120 mg / dl Electronic Signature(s) Signed:  11/28/2022 3:30:01 PM By: [name] Entered By: [name] on 11/28/2022 14:47:35

## 2022-11-28 NOTE — Progress Notes (Addendum)
CADAN, GRAVETT [name] [id number] Page 1 of 12 Visit Report for 11/28/2022 Chief Complaint Document Details Patient Name: Date of Service: NA [name]. 11/28/2022 3:00 PM Medical Record Number: [id number] Patient Account Number: [id number] Date of Birth/Sex: Treating [name]: 1955-11-25 (67 y.o. M) Primary Care Provider: [name] Other Clinician: Referring Provider: Treating Provider/Extender: [name] in Treatment: 21 Information Obtained from: Patient Chief Complaint Patients presents for treatment of an open diabetic ulcer with exposed bone and osteomyelitis 07/04/2022: Diabetic ankle ulcer Electronic Signature(s) Signed: 11/28/2022 3:02:26 PM By: [name] MD FACS Entered By: [name] on 11/28/2022 15:02:26 -------------------------------------------------------------------------------- Debridement Details Patient Name: Date of Service: NA [name], [name]. 11/28/2022 3:00 PM Medical Record Number: [id number] Patient Account Number: [id number] Date of Birth/Sex: Treating [name]: 05-15-1956 (67 y.o. Marlan [name] Primary Care Provider: [name] Other Clinician: Referring Provider: Treating Provider/Extender: [name]: 21 Debridement Performed for Assessment: Wound #2 Left,Medial Ankle Performed By: Physician [name], MD Debridement Type: Debridement Severity of Tissue Pre Debridement: Fat layer exposed Level of Consciousness (Pre-procedure): Awake and Alert Pre-procedure Verification/Time Out Yes - 14:59 Taken: Start Time: 14:59 Pain Control: Lidocaine 4% T opical Solution Percent of Wound Bed Debrided: 100% T Area Debrided (cm): otal 1.18 Tissue and other material debrided: Non-Viable, Slough, Slough Level: Non-Viable Tissue Debridement Description: Selective/Open Wound Instrument: Curette Bleeding: Minimum Hemostasis Achieved:  Pressure Response to Treatment: Procedure was tolerated well Level of Consciousness (Post- Awake and Alert procedure): Post Debridement Measurements of Total Wound Length: (cm) 1 Width: (cm) 1.5 Depth: (cm) 0.5 Volume: (cm) 0.589 Character of Wound/Ulcer Post Debridement: Improved Severity of Tissue Post Debridement: Fat layer exposed Cruise, [name] ([id number]) [phone].pdf Page 2 of 12 Post Procedure Diagnosis Same as Pre-procedure Notes scribed for Dr. Lady Gary by Samuella Bruin, [name] Electronic Signature(s) Signed: 11/28/2022 3:27:18 PM By: [name] MD FACS Signed: 11/28/2022 3:30:01 PM By: Samuella Bruin Entered By: Samuella Bruin on 11/28/2022 15:00:50 -------------------------------------------------------------------------------- HPI Details Patient Name: Date of Service: NA [name], [name]. 11/28/2022 3:00 PM Medical Record Number: [id number] Patient Account Number: [id number] Date of Birth/Sex: Treating [name]: [birth year] (67 y.o. M) Primary Care Provider: [name] Other Clinician: Referring Provider: Treating Provider/Extender: [name] in Treatment: 21 History of Present Illness HPI Description: ADMISSION 08/25/2021 This is [name] 67 year old man who initially presented to his primary care provider in September 2022 with pain in his left foot. He was sent for an x-ray and while the x-ray was being performed, the tech pointed out [name] wound on his foot that the patient was not aware existed. He does have type 2 diabetes with significant neuropathy. His diabetes is suboptimally controlled with his most recent A1c being 8.5. He also has [name] history of coronary artery disease status post three- vessel CABG. he was initially seen by orthopedics, but they referred him to [facility]. He has undergone at least 7 operations/debridements and several applications of skin substitute under the care of  podiatry. He has been in [name] for much of this time. His most recent procedure was July 28, 2021. [name] was biopsied and was found to be consistent with osteomyelitis. Culture also returned positive for corynebacterium. He was seen on August 16, 2021 by infectious disease. [name] PICC line has been placed and he will be receiving [name] 6-week course of IV daptomycin and cefepime. In October 2022, he  underwent lower extremity vascular studies. Results are copied here: Right: Resting right ankle-brachial index is within normal range. No evidence of significant right lower extremity arterial disease. The right toe-brachial index is abnormal. Left: Resting left ankle-brachial index indicates mild left lower extremity arterial disease. The left toe-brachial index is abnormal. He has not been seen by vascular surgery despite these findings. He presented to clinic today in [name] and is using [name] to offload. Wound VAC was in place. Once this was removed, [name] large ulcer was identified on the left midfoot/ankle. Bone is frankly exposed. There is no malodorous or purulent drainage. There is some granulation tissue over the central portion of the exposed bone. There is [name] tunnel that extends posteriorly for roughly 10 cm. It has been discussed with him by multiple providers that he is at very high risk of losing his lower leg because of this wound. He is extremely eager to avoid this outcome and is here today to review his options as well as receive ongoing wound care. 09/03/2021: Here for reevaluation of his wound. There does not appear to have been any substantial improvement overall since our last visit. He has been in [name] with white foam overlying the exposed bone. We are working on getting him approved for hyperbaric oxygen therapy. 09/10/2021: We are in the process of getting him cleared to begin hyperbaric oxygen therapy. He still needs to obtain [name] chest x-ray.  Although the wound measurements are roughly the same, I think the overall appearance of the wound is better. The exposed bone has [name] bit more granulation tissue covering it. He has not received [name] vascular surgery appointment to reevaluate his flow to the wound. 09/17/2021: He has been approved for hyperbaric oxygen therapy and completed his chest x-ray, which I reviewed and it appears normal. The tunnels at the 12 and 10:00 positions are smaller. There is more granulation tissue covering the exposed bone and the undermining has decreased. He still has not received [name] vascular surgery appointment. 09/24/2021: He initiated hyperbaric oxygen therapy this week and is tolerating it well. He has an appointment with vascular surgery coming up on May 16. The granulation tissue is covering more of the exposed bone and both tunnels are [name] bit smaller. 10/01/2021: He continues to tolerate hyperbaric oxygen therapy. He saw infectious disease and they are planning to pull his PICC line. He has been initiated on oral antibiotics (doxycycline and Augmentin). The wound looks about the same but the tunnels are [name] little bit smaller. The skin seems to be contracting somewhat around the exposed bone. 10/08/2021: The wound is still about the same size, but the tunnels continue to come in and the skin is contracting around the exposed bone. He continues to have some accumulation of necrotic material in the inferoposterior aspect of the wound as well as accumulation at the 12:00 tunnel area. 10/15/2021: The wound is smaller today. The tunnels continue to come in. There is less necrotic tissue present. He does have some periwound maceration. 10/22/2021: The wound is about the same size. There is [name] little bit less undermining at the distal portion. The exposed bone is dark and I am not sure if this is staining from silver nitrate or his VAC sponge or if it represents necrosis. The tunnels are shallower but he does have some serous  drainage coming from the [employer] [facility] [employer] [facility] [name] ([id number]) [phone].pdf Page 3 of 12 10:00 tunnel. He continues to tolerate hyperbaric oxygen therapy well.  10/29/2021: The undermining continues to improve. The tunnels are about the same. He has good granulation tissue overlying the majority of the exposed bone. It does appear that perhaps the tubing from his wound VAC has been eroding the skin at the 12 clock position. He continues to accumulate senescent epithelium around the borders of the wound. 11/05/2021: The undermining is almost completely resolved. The tunnels have contracted fairly significantly. No significant slough or debris accumulation. There is still senescent epithelium accumulation around the borders of the wound. He has been tolerating hyperbaric oxygen therapy well. 11/12/2021: Despite the measurements of the wound being about the same, the wound has changed in its shape and overall, I think it is improved. The undermining has resolved and the tunnels continue to shorten. There is good granulation tissue encroaching over the small area of bone that has remained exposed at the 12 o'clock position. Minimal slough accumulation. He continues to tolerate hyperbaric oxygen therapy well. 11/19/2021: I took [name] PCR culture last week. There was overgrowth of yeast. He is already taking suppressive doxycycline and Augmentin. I added fluconazole to his regimen. The wound is smaller again today. The tunnels continue to shorten. He continues to do well with hyperbaric oxygen therapy. 11/26/2021: For some reason, his foot has become macerated. The wound is narrower but about the same dimensions in its longitudinal aspect. The tunnels continue to shorten. He has some slough buildup on the wound as well as some heaped up senescent epithelium around the perimeter. 12/03/2021: No further maceration of his foot has occurred. The wound has contracted quite significantly from last week. The  tunnel at 10:00 is closed. The tunnel at 12:00 is down to just [name] couple of millimeters. No other undermining is present. There is soft tissue coverage of the previously exposed bone. There is just [name] bit of slough and biofilm on the wound surface. 12/10/2021: The wound is looking good. It turns out the tunnel at 12:00 is only exposed when the patient dorsiflexes his foot. It is about 2 cm in depth when he does this; when his foot is in plantarflexion, the tunnel is closed. The bone that was visible at the 12:00 tunnel is completely covered with granulation tissue, but there does feel like some exposed bone deeper into the tunnel area. There is senescent skin heaped up around the periphery. Minimal slough on the wound surface. 12/16/2021: The wound dimensions are roughly the same. The surface has nice granulation tissue. The exposed bone at the 12:00 tunnel continues to be covered with more soft tissue. 7/14; patient's wound measures smaller today. Using the wound VAC with underlying collagen. He is also being treated with HBO for underlying osteomyelitis. He tells me he is on doxycycline and ampicillin follows with infectious disease next week 12/31/2021: The wound continues to contract. Unfortunately, the area where the track pad and tubing have been rubbing continues to look like it is applying friction. He says that the home health nurses that have been applying the [facility] have been putting gauze underneath the tubing, but nonetheless there is ongoing tissue breakdown at this site. Light slough accumulation on the wound surface. The tunnel continues to contract. He is tolerating HBO without difficulty. 01/07/2022: Bridging the wound VAC away from the ankle has resulted in significant improvement in the tissue at the apex of the wound. The tunnel is still present and is not all that much shorter, but the overall wound surface is very robust and healthy looking. Minimal slough accumulation. No concern for  acute infection. 01/21/2022: The wound continues to contract and has [name] robust granulation tissue surface. The tunnel has come in considerably and is down to about 1.4 cm. There is still bone exposed within the tunnel but the rest of it is well covered. There is some senescent epithelium at the wound margins and [name] little bit of slough on the surface. 01/28/2022: No significant change in the wound this week, but there has not been any reaccumulation of senescent epithelium or slough. The tunnel is perhaps [name] millimeter less in depth. He has been approved for Apligraf and we will apply this today. 02/04/2022: The wound has contracted somewhat and the tunnel has filled in completely. The wound surface is clean. He is here for Apligraf #2. 02/11/2022: The wound has contracted further and is now nearly flush with the surrounding skin surface. Light layer of slough. Apligraf #3 plan for today. 02/18/2022: There is [name] band of epithelium trying to cut across the superior portion of the wound. There is robust granulation tissue with just [name] light layer of slough and biofilm on the surface. There was [name] little bit of greenish drainage in the wound VAC but none appreciated on the site itself. He is here for Apligraf #4. 03/04/2022: The wound has contracted considerably. There is good granulation tissue on the surface. Minimal biofilm. He is here for Apligraf #5. 03/18/2022: The wound has epithelialized to the point that it has been divided into 2 areas. Both areas are smaller in total than at his previous visit. There is good granulation tissue present with just [name] little bit of periwound eschar and surface slough. 10/13; left medial foot. The patient has completed treatment with hyperbaric oxygen for underlying osteomyelitis he has had [name] nice response in the wound. Noted today that he still had some greenish drainage. Previously he has been treated with topical gentamicin but apparently that was stopped 2 weeks  ago. Fortunately the wound is measuring smaller 10/20; wound looks better no hypergranulation minimal drainage. Granulation tissue looks healthy using gentamicin Hydrofera Blue under compression 04/08/2022: The wound continues to contract tremendously. The response to his treatment for hypertrophic granulation tissue has been quite remarkable. There are just [name] couple of open areas remaining. 04/18/2022: His wound is healed. READMISSION 07/04/2022: He returns to clinic with [name] new ulcer adjacent to where his previous large wound had been. He says that he was wearing boots and using [name] small padded dressing to protect the freshly healed ankle wound. Apparently the dressing rolled up and rubbed on his skin, causing [name] new wound. He contacted our office last week concerned about the appearance. There is [name] small oval wound on his left medial ankle. It is fairly clean with some periwound dry skin and eschar. 07/11/2022: The wound is about the same size this week. There is [name] layer of rubbery slough on the surface. It has [name] fairly strong odor. 07/18/2022: The wound measured larger this week due to small satellite areas opening in [name] fan pattern distal to the primary wound. He continues to accumulate slough on the wound surface. The culture that I took last week only grew out low levels of skin flora. 07/25/2022: The satellite areas that had opened last week have closed. The main wound is smaller with some slough accumulation. 08/01/2022: The wound continues to contract. There is [name] layer of slough on the surface. 08/08/2022: The wound is [name] little bit smaller again this week. Still with slough buildup. 3/18; patient presents for follow-up. He has  been doing dressing changes on his own with silver alginate and antibiotic ointment. He has been at [employer] over the past 3 weeks. He has developed [name] new wound. [name], [name] ([id number]) [id number] Page 4 of 12 09/05/2022: I am not sure if  the new wound that was reported last week is just the satellite area that had closed [name] couple of weeks ago and reopened. Regardless, both wounds are quite small with good epithelialization. There is some eschar buildup around the periphery of both wounds with slough on both wound surfaces. 09/12/2022: The wounds have converged. There is some increased in depth at the distal portion. This seems to be secondary to moisture accumulation as the periwound skin is [name] bit macerated. 09/26/2022: The wound has deteriorated further. There is increased depth at the distal portion and the wound probes to bone. The patient theorizes that the boot he has been wearing is rubbing in this spot and upon inspection of the boot, there is actually [name] hard plastic boot in exactly this location, supporting his theory. 10/05/2022: The wound looks [name] little bit better this week. The area that probed to bone now has some tissue coverage. There is still some slough buildup on the wound surfaces. 10/10/2022: The wound is strongly malodorous today. There is no purulent drainage, nor any increased erythema. There is some tissue maceration around the wound, but he did not have [name] whole lot of drainage on his dressings. There is slough on the wound surface. 10/17/2022: The wound still has [name] fairly strong odor and the drainage has increased. It is goopy and purulent. The culture that I took last week returned late on Thursday with methicillin and tetracycline resistant staph epidermidis and multiple other staph species at fairly high concentrations. 10/27/2022: The wound has cleaned up considerably. There is no odor and less drainage. The drainage is no longer purulent. He is taking Bactrim as prescribed. 11/03/2022: He completed his course of Bactrim however the wound has deteriorated, has [name] foul odor, and there are fragments of bone breaking off in his wound. His drainage was dark and purulent. 11/08/2022: As expected, the bone biopsy returned  with osteomyelitis and osteonecrosis. His culture returned with [name] number of species with the Levaquin I had already prescribed being along with the first-line choice as suggested by the pharmacist. Notably, however, Pseudomonas aeruginosa was not among the species cultivated. He has an appointment to see Dr. Daiva Eves on Wednesday the 19th; he has vascular follow-up on Thursday the sixth. The wound actually looks [name] little bit better today. There are no chunks of bone falling out, nor am I actually able to probe or palpate bone. He did have [name] bit more drainage, but this was noted as murky or purulent as at his previous visit. There is slough buildup on the wound surface and the granulation tissue present is [name] little bit hypertrophic and friable. 11/15/2022: The wound actually looks [name] little bit better today. I can still palpate bone with his foot in [name] relaxed, plantarflexed position, but there does seem to be some tissue covering it. The drainage has improved. He has completed the oral levofloxacin that I prescribed. He is going to see vascular surgery on Thursday and has an appointment with infectious disease on Wednesday the 19th. 11/21/2022: There is just [name] tiny little spike of bone that is still palpable. Everything else looks improved. He is having an angiogram next Friday to see if he can be optimized from an arterial inflow perspective.  11/28/2022: The bone is completely covered with soft tissue. The wound is smaller. Electronic Signature(s) Signed: 11/28/2022 3:03:37 PM By: [name] MD FACS Entered By: [name] on 11/28/2022 15:03:37 -------------------------------------------------------------------------------- Physical Exam Details Patient Name: Date of Service: NA [name], [name]. 11/28/2022 3:00 PM Medical Record Number: [id number] Patient Account Number: [id number] Date of Birth/Sex: Treating [name]: 1955-11-04 (67 y.o. M) Primary Care Provider: [name] Other  Clinician: Referring Provider: Treating Provider/Extender: [name]: 21 Constitutional Hypertensive, asymptomatic. . . . no acute distress. Respiratory Normal work of breathing on room air. Notes 11/28/2022: The bone is completely covered with soft tissue. The wound is smaller. Electronic Signature(s) Signed: 11/28/2022 3:04:08 PM By: [name] MD FACS Entered By: [name] on 11/28/2022 15:04:07 [name] ([id number]) [id number] Page 5 of 12 -------------------------------------------------------------------------------- Physician Orders Details Patient Name: Date of Service: Colin Fanning [name]. 11/28/2022 3:00 PM Medical Record Number: [id number] Patient Account Number: [id number] Date of Birth/Sex: Treating [name]: [birth year] (67 y.o. 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 - Dr. Lady Gary - room 2 Return [name] Anesthetic (In clinic) Topical Lidocaine 4% applied to wound bed Bathing/ Shower/ Hygiene May shower with protection but do not get wound dressing(s) wet. Protect dressing(s) with water repellant cover (for example, large plastic bag) or [name] cast cover and may then take shower. Edema Control - Lymphedema / SCD / Other Elevate legs to the level of the heart or above for 30 minutes daily and/or when sitting for 3-4 times [name] day throughout the day. Avoid standing for long periods of time. Off-Loading Open toe surgical shoe to: - left foot Other: - knee scooter to left leg, stop  wearing your orthoboot Wound Treatment Wound #2 - Ankle Wound Laterality: Left, Medial Cleanser: Normal Saline 1 x Per Week/30 Days Discharge Instructions: Cleanse the wound with Normal Saline prior to applying [name] clean dressing using gauze sponges, not tissue or cotton balls. Peri-Wound Care: Zinc Oxide Ointment 30g tube 1 x Per Week/30 Days Discharge Instructions: Apply Zinc Oxide to periwound with each dressing change Peri-Wound Care: Sween Lotion (Moisturizing lotion) 1 x Per Week/30 Days Discharge Instructions: Apply moisturizing lotion as directed Topical: Gentamicin 1 x Per Week/30 Days Discharge Instructions: As directed by physician Topical: Mupirocin Ointment 1 x Per Week/30 Days Discharge Instructions: Apply Mupirocin (Bactroban) as instructed Prim Dressing: Hydrofera Blue Ready Transfer Foam, 2.5x2.5 (in/in) 1 x Per Week/30 Days ary Discharge Instructions: Apply directly to wound bed as directed Secondary Dressing: Woven Gauze Sponge, Non-Sterile 4x4 in 1 x Per Week/30 Days Discharge Instructions: Apply over primary dressing as directed. Secondary Dressing: Zetuvit Plus 4x8 in (Generic) 1 x Per Week/30 Days Discharge Instructions: Apply over primary dressing as directed. Secured With: Transpore Surgical Tape, 2x10 (in/yd) 1 x Per Week/30 Days Discharge Instructions: Secure dressing with tape as directed. Compression Wrap: Urgo K2 Lite, (equivalent to [name] 3 layer) two layer compression system, regular 1 x Per Week/30 Days Discharge Instructions: Apply Urgo K2 Lite as directed (alternative to 3 layer compression). [name], [name] ([id number]) [id number] Page 6 of 12 Patient Medications llergies: codeine [name] Notifications Medication Indication Start  End 11/28/2022 lidocaine DOSE topical 4 % cream - cream topical Electronic Signature(s) Signed: 11/28/2022 3:27:18 PM By: [name] MD FACS Entered By: [name] on 11/28/2022  15:04:22 -------------------------------------------------------------------------------- Problem List Details Patient Name: Date of Service: NA [name], [name]. 11/28/2022 3:00 PM Medical Record Number: [id number] Patient Account Number: [id number] Date of Birth/Sex: Treating [name]: 18-Aug-1955 (67 y.o. M) Primary Care Provider: [name] Other Clinician: Referring Provider: Treating Provider/Extender: [name]: 21 Active Problems ICD-10 Encounter Code Description Active Date MDM Diagnosis L97.324 Non-pressure chronic ulcer of left ankle with necrosis of bone 07/04/2022 No Yes I73.9 Peripheral vascular disease, unspecified 07/04/2022 No Yes E11.622 Type 2 diabetes mellitus with other skin ulcer 07/04/2022 No Yes Inactive Problems Resolved Problems Electronic Signature(s) Signed: 11/28/2022 2:58:03 PM By: [name] MD FACS Entered By: [name] on 11/28/2022 14:58:03 -------------------------------------------------------------------------------- Progress Note Details Patient Name: Date of Service: NA [name], [name]. 11/28/2022 3:00 PM Medical Record Number: [id number] Patient Account Number: [id number] Date of Birth/Sex: Treating [name]: [birth year] (67 y.o. M) Primary Care Provider: [name] Other Clinician: Referring Provider: Treating Provider/Extender: [name] in Treatment: [address] Gunter [name] ([id number]) [id number] Page 7 of 12 Subjective Chief Complaint Information obtained from Patient Patients presents for treatment of an open diabetic ulcer with exposed bone and osteomyelitis 07/04/2022: Diabetic ankle ulcer History of Present Illness (HPI) ADMISSION 08/25/2021 This is [name] 67 year old man who initially presented to his primary care provider in September 2022 with pain in his left foot. He was sent for an x-ray and while the x-ray was being performed, the tech pointed out  [name] wound on his foot that the patient was not aware existed. He does have type 2 diabetes with significant neuropathy. His diabetes is suboptimally controlled with his most recent A1c being 8.5. He also has [name] history of coronary artery disease status post three- vessel CABG. he was initially seen by orthopedics, but they referred him to [facility]. He has undergone at least 7 operations/debridements and several applications of skin substitute under the care of podiatry. He has been in [name] for much of this time. His most recent procedure was July 28, 2021. [name] was biopsied and was found to be consistent with osteomyelitis. Culture also returned positive for corynebacterium. He was seen on August 16, 2021 by infectious disease. [name] PICC line has been placed and he will be receiving [name] 6-week course of IV daptomycin and cefepime. In October 2022, he underwent lower extremity vascular studies. Results are copied here: Right: Resting right ankle-brachial index is within normal range. No evidence of significant right lower extremity arterial disease. The right toe-brachial index is abnormal. Left: Resting left ankle-brachial index indicates mild left lower extremity arterial disease. The left toe-brachial index is abnormal. He has not been seen by vascular surgery despite these findings. He presented to clinic today in [name] and is using [name] to offload. Wound VAC was in place. Once this was removed, [name] large ulcer was identified on the left midfoot/ankle. Bone is frankly exposed. There is no malodorous or purulent drainage. There is some granulation tissue over the central portion of the exposed bone. There is [name] tunnel that extends posteriorly for roughly 10 cm. It has been discussed with him by multiple providers that he is at very high risk of losing his lower leg because of this wound. He is extremely  eager to avoid this outcome and is here  today to review his options as well as receive ongoing wound care. 09/03/2021: Here for reevaluation of his wound. There does not appear to have been any substantial improvement overall since our last visit. He has been in [name] with white foam overlying the exposed bone. We are working on getting him approved for hyperbaric oxygen therapy. 09/10/2021: We are in the process of getting him cleared to begin hyperbaric oxygen therapy. He still needs to obtain [name] chest x-ray. Although the wound measurements are roughly the same, I think the overall appearance of the wound is better. The exposed bone has [name] bit more granulation tissue covering it. He has not received [name] vascular surgery appointment to reevaluate his flow to the wound. 09/17/2021: He has been approved for hyperbaric oxygen therapy and completed his chest x-ray, which I reviewed and it appears normal. The tunnels at the 12 and 10:00 positions are smaller. There is more granulation tissue covering the exposed bone and the undermining has decreased. He still has not received [name] vascular surgery appointment. 09/24/2021: He initiated hyperbaric oxygen therapy this week and is tolerating it well. He has an appointment with vascular surgery coming up on May 16. The granulation tissue is covering more of the exposed bone and both tunnels are [name] bit smaller. 10/01/2021: He continues to tolerate hyperbaric oxygen therapy. He saw infectious disease and they are planning to pull his PICC line. He has been initiated on oral antibiotics (doxycycline and Augmentin). The wound looks about the same but the tunnels are [name] little bit smaller. The skin seems to be contracting somewhat around the exposed bone. 10/08/2021: The wound is still about the same size, but the tunnels continue to come in and the skin is contracting around the exposed bone. He continues to have some accumulation of necrotic material in the inferoposterior aspect of the wound as well as  accumulation at the 12:00 tunnel area. 10/15/2021: The wound is smaller today. The tunnels continue to come in. There is less necrotic tissue present. He does have some periwound maceration. 10/22/2021: The wound is about the same size. There is [name] little bit less undermining at the distal portion. The exposed bone is dark and I am not sure if this is staining from silver nitrate or his VAC sponge or if it represents necrosis. The tunnels are shallower but he does have some serous drainage coming from the 10:00 tunnel. He continues to tolerate hyperbaric oxygen therapy well. 10/29/2021: The undermining continues to improve. The tunnels are about the same. He has good granulation tissue overlying the majority of the exposed bone. It does appear that perhaps the tubing from his wound VAC has been eroding the skin at the 12 clock position. He continues to accumulate senescent epithelium around the borders of the wound. 11/05/2021: The undermining is almost completely resolved. The tunnels have contracted fairly significantly. No significant slough or debris accumulation. There is still senescent epithelium accumulation around the borders of the wound. He has been tolerating hyperbaric oxygen therapy well. 11/12/2021: Despite the measurements of the wound being about the same, the wound has changed in its shape and overall, I think it is improved. The undermining has resolved and the tunnels continue to shorten. There is good granulation tissue encroaching over the small area of bone that has remained exposed at the 12 o'clock position. Minimal slough accumulation. He continues to tolerate hyperbaric oxygen therapy well. 11/19/2021: I took [name] PCR culture  last week. There was overgrowth of yeast. He is already taking suppressive doxycycline and Augmentin. I added fluconazole to his regimen. The wound is smaller again today. The tunnels continue to shorten. He continues to do well with hyperbaric oxygen  therapy. 11/26/2021: For some reason, his foot has become macerated. The wound is narrower but about the same dimensions in its longitudinal aspect. The tunnels continue to shorten. He has some slough buildup on the wound as well as some heaped up senescent epithelium around the perimeter. 12/03/2021: No further maceration of his foot has occurred. The wound has contracted quite significantly from last week. The tunnel at 10:00 is closed. The tunnel at 12:00 is down to just [name] couple of millimeters. No other undermining is present. There is soft tissue coverage of the previously exposed bone. There is just [name] bit of slough and biofilm on the wound surface. 12/10/2021: The wound is looking good. It turns out the tunnel at 12:00 is only exposed when the patient dorsiflexes his foot. It is about 2 cm in depth when he does this; when his foot is in plantarflexion, the tunnel is closed. The bone that was visible at the 12:00 tunnel is completely covered with granulation tissue, but there does feel like some exposed bone deeper into the tunnel area. There is senescent skin heaped up around the periphery. Minimal slough on the wound surface. 12/16/2021: The wound dimensions are roughly the same. The surface has nice granulation tissue. The exposed bone at the 12:00 tunnel continues to be covered with more soft tissue. [name], [name] [id number] Page 8 of 12 7/14; patient's wound measures smaller today. Using the wound VAC with underlying collagen. He is also being treated with HBO for underlying osteomyelitis. He tells me he is on doxycycline and ampicillin follows with infectious disease next week 12/31/2021: The wound continues to contract. Unfortunately, the area where the track pad and tubing have been rubbing continues to look like it is applying friction. He says that the home health nurses that have been applying the [facility] have been putting gauze underneath the tubing,  but nonetheless there is ongoing tissue breakdown at this site. Light slough accumulation on the wound surface. The tunnel continues to contract. He is tolerating HBO without difficulty. 01/07/2022: Bridging the wound VAC away from the ankle has resulted in significant improvement in the tissue at the apex of the wound. The tunnel is still present and is not all that much shorter, but the overall wound surface is very robust and healthy looking. Minimal slough accumulation. No concern for acute infection. 01/21/2022: The wound continues to contract and has [name] robust granulation tissue surface. The tunnel has come in considerably and is down to about 1.4 cm. There is still bone exposed within the tunnel but the rest of it is well covered. There is some senescent epithelium at the wound margins and [name] little bit of slough on the surface. 01/28/2022: No significant change in the wound this week, but there has not been any reaccumulation of senescent epithelium or slough. The tunnel is perhaps [name] millimeter less in depth. He has been approved for Apligraf and we will apply this today. 02/04/2022: The wound has contracted somewhat and the tunnel has filled in completely. The wound surface is clean. He is here for Apligraf #2. 02/11/2022: The wound has contracted further and is now nearly flush with the surrounding skin surface. Light layer of slough. Apligraf #3 plan for today. 02/18/2022: There is [name] band of  epithelium trying to cut across the superior portion of the wound. There is robust granulation tissue with just [name] light layer of slough and biofilm on the surface. There was [name] little bit of greenish drainage in the wound VAC but none appreciated on the site itself. He is here for Apligraf #4. 03/04/2022: The wound has contracted considerably. There is good granulation tissue on the surface. Minimal biofilm. He is here for Apligraf #5. 03/18/2022: The wound has epithelialized to the point that it has been divided  into 2 areas. Both areas are smaller in total than at his previous visit. There is good granulation tissue present with just [name] little bit of periwound eschar and surface slough. 10/13; left medial foot. The patient has completed treatment with hyperbaric oxygen for underlying osteomyelitis he has had [name] nice response in the wound. Noted today that he still had some greenish drainage. Previously he has been treated with topical gentamicin but apparently that was stopped 2 weeks ago. Fortunately the wound is measuring smaller 10/20; wound looks better no hypergranulation minimal drainage. Granulation tissue looks healthy using gentamicin Hydrofera Blue under compression 04/08/2022: The wound continues to contract tremendously. The response to his treatment for hypertrophic granulation tissue has been quite remarkable. There are just [name] couple of open areas remaining. 04/18/2022: His wound is healed. READMISSION 07/04/2022: He returns to clinic with [name] new ulcer adjacent to where his previous large wound had been. He says that he was wearing boots and using [name] small padded dressing to protect the freshly healed ankle wound. Apparently the dressing rolled up and rubbed on his skin, causing [name] new wound. He contacted our office last week concerned about the appearance. There is [name] small oval wound on his left medial ankle. It is fairly clean with some periwound dry skin and eschar. 07/11/2022: The wound is about the same size this week. There is [name] layer of rubbery slough on the surface. It has [name] fairly strong odor. 07/18/2022: The wound measured larger this week due to small satellite areas opening in [name] fan pattern distal to the primary wound. He continues to accumulate slough on the wound surface. The culture that I took last week only grew out low levels of skin flora. 07/25/2022: The satellite areas that had opened last week have closed. The main wound is smaller with some slough accumulation. 08/01/2022: The  wound continues to contract. There is [name] layer of slough on the surface. 08/08/2022: The wound is [name] little bit smaller again this week. Still with slough buildup. 3/18; patient presents for follow-up. He has been doing dressing changes on his own with silver alginate and antibiotic ointment. He has been at [employer] over the past 3 weeks. He has developed [name] new wound. 09/05/2022: I am not sure if the new wound that was reported last week is just the satellite area that had closed [name] couple of weeks ago and reopened. Regardless, both wounds are quite small with good epithelialization. There is some eschar buildup around the periphery of both wounds with slough on both wound surfaces. 09/12/2022: The wounds have converged. There is some increased in depth at the distal portion. This seems to be secondary to moisture accumulation as the periwound skin is [name] bit macerated. 09/26/2022: The wound has deteriorated further. There is increased depth at the distal portion and the wound probes to bone. The patient theorizes that the boot he has been wearing is rubbing in this spot and upon inspection of the boot, there  is actually [name] hard plastic boot in exactly this location, supporting his theory. 10/05/2022: The wound looks [name] little bit better this week. The area that probed to bone now has some tissue coverage. There is still some slough buildup on the wound surfaces. 10/10/2022: The wound is strongly malodorous today. There is no purulent drainage, nor any increased erythema. There is some tissue maceration around the wound, but he did not have [name] whole lot of drainage on his dressings. There is slough on the wound surface. 10/17/2022: The wound still has [name] fairly strong odor and the drainage has increased. It is goopy and purulent. The culture that I took last week returned late on Thursday with methicillin and tetracycline resistant staph epidermidis and multiple other staph species at fairly high  concentrations. 10/27/2022: The wound has cleaned up considerably. There is no odor and less drainage. The drainage is no longer purulent. He is taking Bactrim as prescribed. 11/03/2022: He completed his course of Bactrim however the wound has deteriorated, has [name] foul odor, and there are fragments of bone breaking off in his wound. His drainage was dark and purulent. 11/08/2022: As expected, the bone biopsy returned with osteomyelitis and osteonecrosis. His culture returned with [name] number of species with the Levaquin I had already prescribed being along with the first-line choice as suggested by the pharmacist. Notably, however, Pseudomonas aeruginosa was not among the species cultivated. He has an appointment to see Dr. Daiva Eves on Wednesday the 19th; he has vascular follow-up on Thursday the sixth. The wound actually looks [name] little bit better today. There are no chunks of bone falling out, nor am I actually able to probe or palpate bone. He did have [name] bit more drainage, but this was noted as murky or purulent as at his previous visit. There is slough buildup on the wound surface and the granulation tissue present is [name] little bit hypertrophic and friable. [name], [name] ([id number]) [id number] Page 9 of 12 11/15/2022: The wound actually looks [name] little bit better today. I can still palpate bone with his foot in [name] relaxed, plantarflexed position, but there does seem to be some tissue covering it. The drainage has improved. He has completed the oral levofloxacin that I prescribed. He is going to see vascular surgery on Thursday and has an appointment with infectious disease on Wednesday the 19th. 11/21/2022: There is just [name] tiny little spike of bone that is still palpable. Everything else looks improved. He is having an angiogram next Friday to see if he can be optimized from an arterial inflow perspective. 11/28/2022: The bone is completely covered with soft tissue. The wound is  smaller. Patient History Information obtained from Patient. Family History Cancer - Father, Diabetes - Father,Mother,Paternal Grandparents, Heart Disease - Father, Hypertension - Father, No family history of Hereditary Spherocytosis, Kidney Disease, Lung Disease, Seizures, Stroke, Thyroid Problems, Tuberculosis. Social History Never smoker, Marital Status - Married, Alcohol Use - Rarely, Drug Use - No History, Caffeine Use - Daily. Medical History Eyes Patient has history of Cataracts - Removed 2008 Cardiovascular Patient has history of Coronary Artery Disease, Hypertension, Myocardial Infarction, Peripheral Arterial Disease Endocrine Patient has history of Type II Diabetes Musculoskeletal Patient has history of Osteomyelitis Neurologic Patient has history of Neuropathy Medical [name] Surgical History Notes nd Cardiovascular Hypercholesterolemia Abnormal EKG CABG X3 2019 Gastrointestinal GERD Musculoskeletal Diabetic foot ulcer Objective Constitutional Hypertensive, asymptomatic. no acute distress. Vitals Time Taken: 2:47 PM, Height: 74 in, Weight: 187 lbs, BMI: 24, Temperature: 97.9 F,  Pulse: 84 bpm, Respiratory Rate: 18 breaths/min, Blood Pressure: 159/84 mmHg, Capillary Blood Glucose: 120 mg/dl. Respiratory Normal work of breathing on room air. General Notes: 11/28/2022: The bone is completely covered with soft tissue. The wound is smaller. Integumentary (Hair, Skin) Wound #2 status is Open. Original cause of wound was Pressure Injury. The date acquired was: 06/13/2022. The wound has been in treatment 21 weeks. The wound is located on the Left,Medial Ankle. The wound measures 1cm length x 1.5cm width x 0.5cm depth; 1.178cm^2 area and 0.589cm^3 volume. There is Fat Layer (Subcutaneous Tissue) exposed. There is no undermining noted, however, there is tunneling at 6:00 with [name] maximum distance of 0.8cm. There is [name] medium amount of serosanguineous drainage noted. The wound margin is  distinct with the outline attached to the wound base. There is large (67-100%) red granulation within the wound bed. There is [name] small (1-33%) amount of necrotic tissue within the wound bed including Adherent Slough. The periwound skin appearance had no abnormalities noted for texture. The periwound skin appearance had no abnormalities noted for color. The periwound skin appearance exhibited: Maceration. Periwound temperature was noted as No Abnormality. 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 There was [name] Selective/Open Wound Non-Viable Tissue Debridement with [name] total area of 1.18 sq cm performed by [name], MD. With the following instrument(s): Curette to remove Non-Viable tissue/material. Material removed includes [facility] after achieving pain control using Lidocaine 4% T opical Solution. No specimens were taken. [name] time out was conducted at 14:59, prior to the start of the procedure. [name] Minimum amount of bleeding was controlled with Pressure. The procedure was tolerated well. Post Debridement Measurements: 1cm length x 1.5cm width x 0.5cm depth; 0.589cm^3 volume. Character of Wound/Ulcer Post Debridement is improved. Severity of Tissue Post Debridement is: Fat layer exposed. Post procedure Diagnosis Wound #2: Same as Pre-Procedure General Notes: scribed for Dr. Lady Gary by Samuella Bruin, [name]. Pre-procedure diagnosis of Wound #2 is [name] Diabetic Wound/Ulcer of the Lower Extremity located on the Left,Medial Ankle . There was [name] Double Layer Compression Therapy Procedure by  Colin Ek, [name]. Post procedure Diagnosis Wound #2: Same as Pre-Procedure Plan Follow-up Appointments: Return Appointment in 1 week. - Dr. Lady Gary - room 2 Anesthetic: (In clinic) Topical Lidocaine 4% applied to wound bed Bathing/ Shower/ Hygiene: May shower with protection but do not get wound dressing(s) wet. Protect dressing(s) with water repellant cover (for example, large plastic bag) or [name] cast cover and may then take shower. Edema Control - Lymphedema / SCD / Other: Elevate legs to the level of the heart or above for 30 minutes daily and/or when sitting for 3-4 times [name] day throughout the day. Avoid standing for long periods of time. Off-Loading: Open toe surgical shoe to: - left foot Other: - knee scooter to left leg, stop wearing your orthoboot The following medication(s) was prescribed: lidocaine topical 4 % cream cream topical was prescribed at facility WOUND #2: - Ankle Wound Laterality: Left, Medial Cleanser: Normal Saline 1 x Per Week/30 Days Discharge Instructions: Cleanse the wound with Normal Saline prior to applying [name] clean dressing using gauze sponges, not tissue or cotton balls. Peri-Wound Care:  Zinc Oxide Ointment 30g tube 1 x Per Week/30 Days Discharge Instructions: Apply Zinc Oxide to periwound with each dressing change Peri-Wound Care: Sween Lotion (Moisturizing lotion) 1 x Per Week/30 Days Discharge Instructions: Apply moisturizing lotion as directed Topical: Gentamicin 1 x Per Week/30 Days Discharge Instructions: As directed by physician Topical: Mupirocin Ointment 1 x Per Week/30 Days Discharge Instructions: Apply Mupirocin (Bactroban) as instructed Prim Dressing: Hydrofera Blue Ready Transfer Foam, 2.5x2.5 (in/in) 1 x Per Week/30 Days ary Discharge Instructions: Apply directly to wound bed as directed Secondary Dressing: Woven Gauze Sponge, Non-Sterile 4x4 in 1 x Per Week/30 Days Discharge Instructions: Apply over primary dressing as  directed. Secondary Dressing: Zetuvit Plus 4x8 in (Generic) 1 x Per Week/30 Days Discharge Instructions: Apply over primary dressing as directed. Secured With: Transpore Surgical T ape, 2x10 (in/yd) 1 x Per Week/30 Days Discharge Instructions: Secure dressing with tape as directed. Com pression Wrap: Urgo K2 Lite, (equivalent to [name] 3 layer) two layer compression system, regular 1 x Per Week/30 Days Discharge Instructions: Apply Urgo K2 Lite as directed (alternative to 3 layer compression). 11/28/2022: The bone is completely covered with soft tissue. The wound is smaller. I used [name] curette to debride slough from his wound. We will continue topical gentamicin and mupirocin with Hydrofera Blue ready foam. Continue 3 layer compression/equivalent. He is due to have an arteriogram on Friday and I am hopeful that there will be some vascular intervention available to improve the blood flow to his wound. Follow-up with me next week. Electronic Signature(s) Signed: 11/28/2022 3:05:08 PM By: [name] MD FACS Entered By: [name] on 11/28/2022 15:05:08 [name] [id number] Page 11 of 12 -------------------------------------------------------------------------------- HxROS Details Patient Name: Date of Service: NA [name]. 11/28/2022 3:00 PM Medical Record Number: [id number] Patient Account Number: [id number] Date of Birth/Sex: Treating [name]: [birth year] (67 y.o. M) Primary Care Provider: [name] Other Clinician: Referring Provider: Treating Provider/Extender: [name]: 21 Information Obtained From Patient Eyes Medical History: Positive for: Cataracts - Removed 2008 Cardiovascular Medical History: Positive for: Coronary Artery Disease; Hypertension; Myocardial Infarction; Peripheral Arterial Disease Past Medical History Notes: Hypercholesterolemia Abnormal EKG CABG X3  2019 Gastrointestinal Medical History: Past Medical History Notes: GERD Endocrine Medical History: Positive for: Type II Diabetes Time with diabetes: 24 years Treated with: Insulin, Oral agents Blood sugar tested every day: Yes Tested : Musculoskeletal Medical History: Positive for: Osteomyelitis Past Medical History Notes: Diabetic foot ulcer Neurologic Medical History: Positive for: Neuropathy HBO Extended History Items Eyes: Cataracts Immunizations Pneumococcal Vaccine: Received Pneumococcal Vaccination: Yes Received Pneumococcal Vaccination On or After 60th Birthday: Yes Implantable Devices Yes Family and Social History Cancer: Yes - Father; Diabetes: Yes - Father,Mother,Paternal Grandparents; Heart Disease: Yes - Father; Hereditary Spherocytosis: No; Hypertension: Yes - Father; Kidney Disease: No; Lung Disease: No; Seizures: No; Stroke: No; Thyroid Problems: No; Tuberculosis: No; Never smoker; Marital Status - Married; Alcohol Use: Rarely; Drug Use: No History; Caffeine Use: Daily; Financial Concerns: No; Food, Clothing or Shelter Needs: No; Support System Lacking: No; Transportation Concerns: No Electronic Signature(s) Signed: 11/28/2022 3:27:18 PM By: [name] MD FACS Entered By: [name] on 11/28/2022 15:03:45 [name] ([id number]) [id number] Page 12 of 12 -------------------------------------------------------------------------------- SuperBill Details Patient Name: Date of Service: NA [name]. 11/28/2022 Medical Record Number: [id number] Patient Account Number: [id number] Date of Birth/Sex: Treating [name]: Mar 03, 1956 (67 y.o. 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 Modifier: Quantity: 1 Physician Procedures : CPT4 Code Description Modifier [id number] 99214 - WC PHYS LEVEL 4 - EST PT 25 ICD-10 Diagnosis Description L97.324 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

## 2022-11-29 ENCOUNTER — Encounter: Payer: Self-pay | Admitting: Infectious Disease

## 2022-11-29 DIAGNOSIS — M86179 Other acute osteomyelitis, unspecified ankle and foot: Secondary | ICD-10-CM | POA: Insufficient documentation

## 2022-11-29 HISTORY — DX: Other acute osteomyelitis, unspecified ankle and foot: M86.179

## 2022-11-29 NOTE — Progress Notes (Unsigned)
Subjective:  Complaint continued problem is with bone infection in his left heel  Patient ID: [name], male    DOB: [birth year], 67 y.o.   MRN: [id number]  HPI  [name] is a 67 y.o. male with PMHx as below who presents to the clinic for a diabetic foot wound complicated by osteomyelitis.  He was seen by Dr. Earlene Plater as a new patient in March 2023 and most recently had a video visit on 11/03/21.   He previously underwent multiple surgeries of this wound with podiatry since October 2022.  He underwent debridement and irrigation of his wound down to the bone with application of skin graft substitute and wound VAC on 07/28/2021 with Dr Samuella Cota.  Pathology from his left talus bone was consistent with osteomyelitis and cultures were positive for diphtheroids.  Patient was prescribed doxycycline 100 mg twice daily which he took for approximately 2 weeks starting 08/02/2021.  He presented to [place] for further evaluation and antibiotic recommendations regarding his left foot infection in March and was prescribed broad coverage via PICC line with daptomycin and cefepime x 6 weeks which he completed.     He saw me on 09/29/21 and he reported improvement in his wound despite inflammatory markers not changing much. He was transitioned to Augmentin and Doxycycline following IV therapy and continues on this suppressive regimen at this time. He is tolerating this well and reports no issues with the antibiotics.    He does not appear to have been seen by [place] in ID since July of 2023.   He clearly needs a BKA to cure this infection.  He has been seeing wound care diligently.  He tells me that he was on combination of doxycycline and Augmentin for roughly 6 months.  The prescription then ran out and as mentioned he was not seen by [place].  In the interim has been followed again quite diligently by wound care.  He was on a recent course of Bactrim he then was experiencing crumbling of bone from the wound and  bone was palpable in the wound when Dr. Lady Gary examined it.  He ultimately underwent a biopsy of the bone which came back consistent with osteomyelitis cultures were taken as well which really revealed some organisms that I do not have access to seeing in the computer but which we will request from wound care.  He responded to levofloxacin along with continued wound care with improvement of the superficial tissues around the bone.  He is scheduled for angiography tomorrow.  He understands that this infection will not be cured without a below the knee amputation but would like to forestall that for as long as possible.       Past Medical History:  Diagnosis Date   Coronary artery disease    Diabetes mellitus without complication (HCC)    Type II   GERD (gastroesophageal reflux disease)    Hypertension    Myocardial infarction [facility])    "Silent"   Neuropathy     Past Surgical History:  Procedure Laterality Date   APPLICATION OF WOUND VAC Left 07/28/2021   Procedure: APPLICATION OF WOUND VAC;  Surgeon: Park Liter, DPM;  Location: [facility];  Service: Podiatry;  Laterality: Left;   APPLICATION OF WOUND VAC Left 03/19/2021   Procedure: APPLICATION OF WOUND VAC;  Surgeon: Park Liter, DPM;  Location: [facility];  Service: Podiatry;  Laterality: Left;   BONE BIOPSY Left 05/19/2021   Procedure: BONE BIOPSY;  Surgeon:  Park Liter, DPM;  Location: [facility];  Service: Podiatry;  Laterality: Left;   CATARACT EXTRACTION Bilateral    COLONOSCOPY W/ POLYPECTOMY     CORONARY ARTERY BYPASS GRAFT N/A 12/26/2017   Procedure: CORONARY ARTERY BYPASS GRAFTING (CABG) x 3 Using Left Internal Mammary Artery (LIMA) and Edoscopically Harvested Left Leg Greater Saphenous Vein Graft (SVG); -LIMA to LAD, -SVG to LEFT CIRCUMFLEX, -SVG to PDA,;  Surgeon: Kerin Perna, MD;  Location: [facility] OR;  Service: Open Heart Surgery;  Laterality: N/A;   CORONARY PRESSURE/FFR STUDY N/A 12/19/2017   Procedure: INTRAVASCULAR  PRESSURE WIRE/FFR STUDY;  Surgeon: Lyn Records, MD;  Location: [facility];  Service: Cardiovascular;  Laterality: N/A;   ENDOVEIN HARVEST OF GREATER SAPHENOUS VEIN Left 12/26/2017   Procedure: ENDOVEIN HARVEST OF GREATER SAPHENOUS VEIN: Left Thigh to below the knee;  Surgeon: Kerin Perna, MD;  Location: [facility];  Service: Open Heart Surgery;  Laterality: Left;   LEFT HEART CATH AND CORONARY ANGIOGRAPHY N/A 12/19/2017   Procedure: LEFT HEART CATH AND CORONARY ANGIOGRAPHY;  Surgeon: Lyn Records, MD;  Location: [facility] CV [facility];  Service: Cardiovascular;  Laterality: N/A;   TEE WITHOUT CARDIOVERSION N/A 12/26/2017   Procedure: TRANSESOPHAGEAL ECHOCARDIOGRAM (TEE);  Surgeon: Donata Clay, Theron Arista, MD;  Location: [facility] OR;  Service: Open Heart Surgery;  Laterality: N/A;   VASECTOMY     WOUND DEBRIDEMENT Left 03/16/2021   Procedure: DEBRIDEMENT LEFT FOOT WOUND;  Surgeon: Park Liter, DPM;  Location: [facility];  Service: Podiatry;  Laterality: Left;   WOUND DEBRIDEMENT Left 04/13/2021   Procedure: DEBRIDEMENT WOUND;  Surgeon: Park Liter, DPM;  Location: [facility];  Service: Podiatry;  Laterality: Left;  Leave patient on stretcher   WOUND DEBRIDEMENT Left 05/19/2021   Procedure: DEBRIDEMENT WOUND;  Surgeon: Park Liter, DPM;  Location: [facility];  Service: Podiatry;  Laterality: Left;   WOUND DEBRIDEMENT Left 07/06/2021   Procedure: DEBRIDEMENT WOUND;  Surgeon: Park Liter, DPM;  Location: [facility];  Service: Podiatry;  Laterality: Left;   WOUND DEBRIDEMENT Left 07/28/2021   Procedure: DEBRIDEMENT WOUND AND APPLICATION OF SKIN GRAFT SUBSTITUTE;  Surgeon: Park Liter, DPM;  Location: [facility];  Service: Podiatry;  Laterality: Left;   WOUND DEBRIDEMENT Left 03/19/2021   Procedure: DEBRIDEMENT WOUND;  Surgeon: Park Liter, DPM;  Location: [facility];  Service: Podiatry;  Laterality: Left;    Family History  Problem Relation Age of Onset   Diabetes Mother    Diabetes Father    Heart  disease Father    Diabetes Daughter       Social History   Socioeconomic History   Marital status: Married    Spouse name: Not on file   Number of children: Not on file   Years of education: Not on file   Highest education level: Not on file  Occupational History   Not on file  Tobacco Use   Smoking status: Never   Smokeless tobacco: Never  Vaping Use   Vaping Use: Never used  Substance and Sexual Activity   Alcohol use: Never   Drug use: Never   Sexual activity: Not on file  Other Topics Concern   Not on file  Social History Narrative   Not on file   Social Determinants of Health   Financial Resource Strain: Not on file  Food Insecurity: Not on file  Transportation Needs: Not on file  Physical Activity: Not on file  Stress: Not on  file  Social Connections: Not on file    Allergies  Allergen Reactions   Codeine Sulfate [Codeine] Nausea Only     Current Outpatient Medications:    acetaminophen (TYLENOL) 500 MG tablet, Take 2 tablets (1,000 mg total) by mouth every 6 (six) hours as needed for mild pain or fever., Disp: 30 tablet, Rfl: 0   aspirin EC 81 MG tablet, Take 81 mg by mouth daily., Disp: , Rfl:    atorvastatin (LIPITOR) 80 MG tablet, Take 80 mg by mouth daily., Disp: , Rfl:    calcium carbonate (TUMS - DOSED IN MG ELEMENTAL CALCIUM) 500 MG chewable tablet, Chew 1-2 tablets by mouth daily as needed for indigestion or heartburn., Disp: , Rfl:    ibuprofen (ADVIL) 200 MG tablet, Take 400 mg by mouth every 8 (eight) hours as needed (pain.)., Disp: , Rfl:    LANTUS 100 UNIT/ML injection, Inject 30 Units into the skin 2 (two) times daily., Disp: , Rfl:    lisinopril (ZESTRIL) 10 MG tablet, Take 10 mg by mouth in the morning., Disp: , Rfl:    metFORMIN (GLUCOPHAGE) 850 MG tablet, Take 850 mg by mouth in the morning, at noon, and at bedtime., Disp: , Rfl:    metoprolol succinate (TOPROL-XL) 50 MG 24 hr tablet, Take 50 mg by mouth daily. Take with or immediately  following a meal., Disp: , Rfl:    sitaGLIPtin (JANUVIA) 100 MG tablet, Take 100 mg by mouth every morning. , Disp: , Rfl:    Review of Systems  Constitutional:  Negative for activity change, appetite change, chills, diaphoresis, fatigue, fever and unexpected weight change.  HENT:  Negative for congestion, rhinorrhea, sinus pressure, sneezing, sore throat and trouble swallowing.   Eyes:  Negative for photophobia and visual disturbance.  Respiratory:  Negative for cough, chest tightness, shortness of breath, wheezing and stridor.   Cardiovascular:  Negative for chest pain, palpitations and leg swelling.  Gastrointestinal:  Negative for abdominal distention, abdominal pain, anal bleeding, blood in stool, constipation, diarrhea, nausea and vomiting.  Genitourinary:  Negative for difficulty urinating, dysuria, flank pain and hematuria.  Musculoskeletal:  Negative for arthralgias, back pain, gait problem, joint swelling and myalgias.  Skin:  Positive for wound. Negative for color change, pallor and rash.  Neurological:  Negative for dizziness, tremors, weakness and light-headedness.  Hematological:  Negative for adenopathy. Does not bruise/bleed easily.  Psychiatric/Behavioral:  Negative for agitation, behavioral problems, confusion, decreased concentration, dysphoric mood and sleep disturbance.        Objective:   Physical Exam Constitutional:      Appearance: He is well-developed.  HENT:     Head: Normocephalic and atraumatic.  Eyes:     Conjunctiva/sclera: Conjunctivae normal.  Cardiovascular:     Rate and Rhythm: Normal rate and regular rhythm.  Pulmonary:     Effort: Pulmonary effort is normal. No respiratory distress.     Breath sounds: No wheezing.  Abdominal:     General: There is no distension.     Palpations: Abdomen is soft.  Musculoskeletal:        General: No tenderness. Normal range of motion.     Cervical back: Normal range of motion and neck supple.  Skin:     General: Skin is warm and dry.     Coloration: Skin is not pale.     Findings: No erythema or rash.  Neurological:     General: No focal deficit present.     Mental Status: He is alert  and oriented to person, place, and time.  Psychiatric:        Mood and Affect: Mood normal.        Behavior: Behavior normal.        Thought Content: Thought content normal.        Judgment: Judgment normal.           Assessment & Plan:   Osteomyelitis of left foot with involvement of navicular bone among others  Check sed rate CRP CBC differential BMP with GFR today.  We will get records and his culture data.  Will then continue 1 of 2 strategies to help delayed need for amputation.  We could either have him on chronic antibiotics with the risk of developing resistance and the risk of other things such as C. difficile colitis occurring.  Alternatively we could adopt a reactive strategy where he has antibiotics on hand to use wind his wound is doing less well and there is more overt evidence of active infection.  I have personally spent 42 minutes involved in face-to-face and non-face-to-face activities for this patient on the day of the visit. Professional time spent includes the following activities: Preparing to see the patient (review of tests), Obtaining and/or reviewing separately obtained history (admission/discharge record), Performing a medically appropriate examination and/or evaluation , Ordering medications/tests/procedures, referring and communicating with other health care professionals, Documenting clinical information in the EMR, Independently interpreting results (not separately reported), Communicating results to the patient/family/caregiver, Counseling and educating the patient/family/caregiver and Care coordination (not separately reported).

## 2022-11-30 ENCOUNTER — Telehealth: Payer: Self-pay

## 2022-11-30 ENCOUNTER — Encounter: Payer: Self-pay | Admitting: Infectious Disease

## 2022-11-30 ENCOUNTER — Other Ambulatory Visit: Payer: Self-pay

## 2022-11-30 ENCOUNTER — Ambulatory Visit (INDEPENDENT_AMBULATORY_CARE_PROVIDER_SITE_OTHER): Payer: BC Managed Care – PPO | Admitting: Infectious Disease

## 2022-11-30 VITALS — BP 136/86 | HR 76 | Resp 16 | Ht 72.0 in | Wt 191.6 lb

## 2022-11-30 DIAGNOSIS — Z951 Presence of aortocoronary bypass graft: Secondary | ICD-10-CM | POA: Diagnosis not present

## 2022-11-30 DIAGNOSIS — I10 Essential (primary) hypertension: Secondary | ICD-10-CM

## 2022-11-30 DIAGNOSIS — E1140 Type 2 diabetes mellitus with diabetic neuropathy, unspecified: Secondary | ICD-10-CM | POA: Diagnosis not present

## 2022-11-30 DIAGNOSIS — M86179 Other acute osteomyelitis, unspecified ankle and foot: Secondary | ICD-10-CM

## 2022-11-30 DIAGNOSIS — M86472 Chronic osteomyelitis with draining sinus, left ankle and foot: Secondary | ICD-10-CM

## 2022-11-30 LAB — CBC WITH DIFFERENTIAL/PLATELET
Absolute Monocytes: 405 cells/uL (ref 200–950)
Basophils Absolute: 32 cells/uL (ref 0–200)
Basophils Relative: 0.6 %
Eosinophils Absolute: 157 cells/uL (ref 15–500)
Eosinophils Relative: 2.9 %
HCT: 40.7 % (ref 38.5–50.0)
Hemoglobin: 13.3 g/dL (ref 13.2–17.1)
Lymphs Abs: 1339 cells/uL (ref 850–3900)
MCH: 29.8 pg (ref 27.0–33.0)
MCHC: 32.7 g/dL (ref 32.0–36.0)
MCV: 91.1 fL (ref 80.0–100.0)
MPV: 10.5 fL (ref 7.5–12.5)
Monocytes Relative: 7.5 %
Neutro Abs: 3467 cells/uL (ref 1500–7800)
Neutrophils Relative %: 64.2 %
Platelets: 178 10*3/uL (ref 140–400)
RBC: 4.47 10*6/uL (ref 4.20–5.80)
RDW: 13 % (ref 11.0–15.0)
Total Lymphocyte: 24.8 %
WBC: 5.4 10*3/uL (ref 3.8–10.8)

## 2022-11-30 LAB — COMPLETE METABOLIC PANEL WITH GFR
AG Ratio: 1.6 (calc) (ref 1.0–2.5)
ALT: 20 U/L (ref 9–46)
AST: 16 U/L (ref 10–35)
Albumin: 3.9 g/dL (ref 3.6–5.1)
Alkaline phosphatase (APISO): 87 U/L (ref 35–144)
BUN: 19 mg/dL (ref 7–25)
CO2: 28 mmol/L (ref 20–32)
Calcium: 9.3 mg/dL (ref 8.6–10.3)
Chloride: 106 mmol/L (ref 98–110)
Creat: 1.04 mg/dL (ref 0.70–1.35)
Globulin: 2.4 g/dL (calc) (ref 1.9–3.7)
Glucose, Bld: 168 mg/dL — ABNORMAL HIGH (ref 65–99)
Potassium: 5.4 mmol/L — ABNORMAL HIGH (ref 3.5–5.3)
Sodium: 138 mmol/L (ref 135–146)
Total Bilirubin: 0.5 mg/dL (ref 0.2–1.2)
Total Protein: 6.3 g/dL (ref 6.1–8.1)
eGFR: 80 mL/min/{1.73_m2} (ref >=60)

## 2022-11-30 LAB — C-REACTIVE PROTEIN: CRP: <3 mg/L (ref <8.0)

## 2022-11-30 LAB — SEDIMENTATION RATE: Sed Rate: 25 mm/h — ABNORMAL HIGH (ref 0–20)

## 2022-11-30 NOTE — Telephone Encounter (Signed)
Patient returned call. Relayed lab results and requested he follow up with pcp.  Verbalized understanding Juanita Laster, RMA

## 2022-11-30 NOTE — Telephone Encounter (Signed)
Called patient to relay provider's message, no answer. Left HIPAA compliant voicemail requesting callback.   Elsworth Ledin D Aleksia Freiman, RN  

## 2022-11-30 NOTE — Telephone Encounter (Signed)
-----   Message from Randall Hiss, MD sent at 11/30/2022  4:23 PM EDT ----- Regarding: K up a bit, should have checked when he comes into the hospital or with PCP  ----- Message ----- From: Interface, [facility] Lab Results In Sent: 11/30/2022   2:44 PM EDT To: Randall Hiss, MD

## 2022-12-01 NOTE — Telephone Encounter (Signed)
Patient returned call and made aware of potassium level. Patient verbalized understanding and states "I will see my PCP".   Valarie Cones, LPN

## 2022-12-02 ENCOUNTER — Other Ambulatory Visit: Payer: Self-pay

## 2022-12-02 ENCOUNTER — Encounter (HOSPITAL_COMMUNITY)
Admission: RE | Disposition: A | Discharge status: Home / Self Care | Payer: Self-pay | Source: Home / Self Care | Attending: Vascular Surgery

## 2022-12-02 ENCOUNTER — Ambulatory Visit (HOSPITAL_COMMUNITY)
Admission: RE | Admit: 2022-12-02 | Discharge: 2022-12-02 | Disposition: A | Discharge status: Home / Self Care | Payer: BC Managed Care – PPO | Attending: Vascular Surgery | Admitting: Vascular Surgery

## 2022-12-02 DIAGNOSIS — E1151 Type 2 diabetes mellitus with diabetic peripheral angiopathy without gangrene: Secondary | ICD-10-CM | POA: Diagnosis not present

## 2022-12-02 DIAGNOSIS — I2510 Atherosclerotic heart disease of native coronary artery without angina pectoris: Secondary | ICD-10-CM | POA: Insufficient documentation

## 2022-12-02 DIAGNOSIS — I70245 Atherosclerosis of native arteries of left leg with ulceration of other part of foot: Secondary | ICD-10-CM | POA: Insufficient documentation

## 2022-12-02 DIAGNOSIS — I70244 Atherosclerosis of native arteries of left leg with ulceration of heel and midfoot: Secondary | ICD-10-CM | POA: Diagnosis not present

## 2022-12-02 DIAGNOSIS — Z7984 Long term (current) use of oral hypoglycemic drugs: Secondary | ICD-10-CM | POA: Insufficient documentation

## 2022-12-02 DIAGNOSIS — Z794 Long term (current) use of insulin: Secondary | ICD-10-CM | POA: Insufficient documentation

## 2022-12-02 DIAGNOSIS — E11621 Type 2 diabetes mellitus with foot ulcer: Secondary | ICD-10-CM | POA: Diagnosis not present

## 2022-12-02 DIAGNOSIS — L97529 Non-pressure chronic ulcer of other part of left foot with unspecified severity: Secondary | ICD-10-CM | POA: Diagnosis not present

## 2022-12-02 HISTORY — PX: ABDOMINAL AORTOGRAM W/LOWER EXTREMITY: CATH118223

## 2022-12-02 HISTORY — PX: PERIPHERAL VASCULAR INTERVENTION: CATH118257

## 2022-12-02 LAB — GLUCOSE, CAPILLARY: Glucose-Capillary: 74 mg/dL (ref 70–99)

## 2022-12-02 LAB — POCT I-STAT, CHEM 8
BUN: 25 mg/dL — ABNORMAL HIGH (ref 8–23)
Calcium, Ion: 1.31 mmol/L (ref 1.15–1.40)
Chloride: 106 mmol/L (ref 98–111)
Creatinine, Ser: 1.2 mg/dL (ref 0.61–1.24)
Glucose, Bld: 127 mg/dL — ABNORMAL HIGH (ref 70–99)
HCT: 41 % (ref 39.0–52.0)
Hemoglobin: 13.9 g/dL (ref 13.0–17.0)
Potassium: 3.9 mmol/L (ref 3.5–5.1)
Sodium: 141 mmol/L (ref 135–145)
TCO2: 28 mmol/L (ref 22–32)

## 2022-12-02 SURGERY — ABDOMINAL AORTOGRAM W/LOWER EXTREMITY
Anesthesia: LOCAL

## 2022-12-02 MED ORDER — FENTANYL CITRATE (PF) 100 MCG/2ML IJ SOLN
INTRAMUSCULAR | Status: AC
  Filled 2022-12-02: qty 2

## 2022-12-02 MED ORDER — PROTAMINE SULFATE 10 MG/ML IV SOLN
INTRAVENOUS | Status: DC | PRN
  Administered 2022-12-02: 5 mg via INTRAVENOUS
  Administered 2022-12-02: 20 mg via INTRAVENOUS

## 2022-12-02 MED ORDER — CLOPIDOGREL BISULFATE 75 MG PO TABS: 75.0000 mg | ORAL_TABLET | Freq: Every day | ORAL | 11 refills | Status: AC

## 2022-12-02 MED ORDER — LABETALOL HCL 5 MG/ML IV SOLN
INTRAVENOUS | Status: AC
  Filled 2022-12-02: qty 4

## 2022-12-02 MED ORDER — SODIUM CHLORIDE 0.9% FLUSH: 3.0000 mL | Freq: Two times a day (BID) | INTRAVENOUS | Status: DC

## 2022-12-02 MED ORDER — SODIUM CHLORIDE 0.9 % IV SOLN: 250.0000 mL | INTRAVENOUS | Status: DC | PRN

## 2022-12-02 MED ORDER — CLOPIDOGREL BISULFATE 75 MG PO TABS
300.0000 mg | ORAL_TABLET | Freq: Once | ORAL | Status: AC
  Administered 2022-12-02: 300 mg via ORAL
  Filled 2022-12-02: qty 4

## 2022-12-02 MED ORDER — MIDAZOLAM HCL 2 MG/2ML IJ SOLN
INTRAMUSCULAR | Status: DC | PRN
  Administered 2022-12-02: 1 mg via INTRAVENOUS

## 2022-12-02 MED ORDER — MIDAZOLAM HCL 2 MG/2ML IJ SOLN
INTRAMUSCULAR | Status: AC
  Filled 2022-12-02: qty 2

## 2022-12-02 MED ORDER — PROTAMINE SULFATE 10 MG/ML IV SOLN
INTRAVENOUS | Status: AC
  Filled 2022-12-02: qty 5

## 2022-12-02 MED ORDER — IODIXANOL 320 MG/ML IV SOLN
INTRAVENOUS | Status: DC | PRN
  Administered 2022-12-02: 70 mL

## 2022-12-02 MED ORDER — ONDANSETRON HCL 4 MG/2ML IJ SOLN: 4.0000 mg | Freq: Four times a day (QID) | INTRAMUSCULAR | Status: DC | PRN

## 2022-12-02 MED ORDER — ACETAMINOPHEN 325 MG PO TABS: 650.0000 mg | ORAL_TABLET | ORAL | Status: DC | PRN

## 2022-12-02 MED ORDER — CLOPIDOGREL BISULFATE 75 MG PO TABS: 75.0000 mg | ORAL_TABLET | Freq: Every day | ORAL | Status: DC

## 2022-12-02 MED ORDER — FENTANYL CITRATE (PF) 100 MCG/2ML IJ SOLN
INTRAMUSCULAR | Status: DC | PRN
  Administered 2022-12-02: 50 ug via INTRAVENOUS
  Administered 2022-12-02: 25 ug via INTRAVENOUS

## 2022-12-02 MED ORDER — HYDRALAZINE HCL 20 MG/ML IJ SOLN: 5.0000 mg | INTRAMUSCULAR | Status: DC | PRN

## 2022-12-02 MED ORDER — HEPARIN (PORCINE) IN NACL 1000-0.9 UT/500ML-% IV SOLN
INTRAVENOUS | Status: DC | PRN
  Administered 2022-12-02 (×2): 500 mL

## 2022-12-02 MED ORDER — LABETALOL HCL 5 MG/ML IV SOLN
INTRAVENOUS | Status: DC | PRN
  Administered 2022-12-02: 10 mg via INTRAVENOUS

## 2022-12-02 MED ORDER — HEPARIN SODIUM (PORCINE) 1000 UNIT/ML IJ SOLN
INTRAMUSCULAR | Status: DC | PRN
  Administered 2022-12-02: 9000 [IU] via INTRAVENOUS

## 2022-12-02 MED ORDER — SODIUM CHLORIDE 0.9 % WEIGHT BASED INFUSION: 1.0000 mL/kg/h | INTRAVENOUS | Status: DC

## 2022-12-02 MED ORDER — SODIUM CHLORIDE 0.9 % IV SOLN: INTRAVENOUS | Status: DC

## 2022-12-02 MED ORDER — LIDOCAINE HCL (PF) 1 % IJ SOLN
INTRAMUSCULAR | Status: AC
  Filled 2022-12-02: qty 30

## 2022-12-02 MED ORDER — SODIUM CHLORIDE 0.9% FLUSH: 3.0000 mL | INTRAVENOUS | Status: DC | PRN

## 2022-12-02 MED ORDER — LIDOCAINE HCL (PF) 1 % IJ SOLN
INTRAMUSCULAR | Status: DC | PRN
  Administered 2022-12-02: 10 mL

## 2022-12-02 MED ORDER — LABETALOL HCL 5 MG/ML IV SOLN: 10.0000 mg | INTRAVENOUS | Status: DC | PRN

## 2022-12-02 SURGICAL SUPPLY — 18 items
BALLN STERLING OTW 3X150X150 (BALLOONS) ×2
BALLN STERLING OTW 4X20X135 (BALLOONS) ×2
BALLOON STERLING OTW 3X150X150 (BALLOONS) IMPLANT
BALLOON STERLING OTW 4X20X135 (BALLOONS) IMPLANT
CATH OMNI FLUSH 5F 65CM (CATHETERS) IMPLANT
DEVICE TORQUE .025-.038 (MISCELLANEOUS) IMPLANT
GUIDEWIRE ANGLED .035X150CM (WIRE) IMPLANT
KIT ENCORE 26 ADVANTAGE (KITS) IMPLANT
KIT MICROPUNCTURE NIT STIFF (SHEATH) IMPLANT
KIT PV (KITS) ×2 IMPLANT
SHEATH CATAPULT 5FR 90 (SHEATH) IMPLANT
SHEATH PINNACLE 5F 10CM (SHEATH) IMPLANT
SYR MEDRAD MARK 7 150ML (SYRINGE) ×2 IMPLANT
TRANSDUCER W/STOPCOCK (MISCELLANEOUS) ×2 IMPLANT
TRAY PV CATH (CUSTOM PROCEDURE TRAY) ×2 IMPLANT
WIRE G V18X300CM (WIRE) IMPLANT
WIRE HI TORQ VERSACORE 300 (WIRE) IMPLANT
WIRE STARTER BENTSON 035X150 (WIRE) IMPLANT

## 2022-12-02 NOTE — Interval H&P Note (Signed)
History and Physical Interval Note:  12/02/2022 7:19 AM  [name]  has presented today for surgery, with the diagnosis of atherosclerosis of native arteries of left leg with ulceration of foot.  The various methods of treatment have been discussed with the patient and family. After consideration of risks, benefits and other options for treatment, the patient has consented to  Procedure(s): ABDOMINAL AORTOGRAM W/LOWER EXTREMITY (N/A) as a surgical intervention.  The patient's history has been reviewed, patient examined, no change in status, stable for surgery.  I have reviewed the patient's chart and labs.  Questions were answered to the patient's satisfaction.     [name]

## 2022-12-02 NOTE — Op Note (Signed)
DATE OF SERVICE: 12/02/2022  PATIENT:  [name]  67 y.o. male  PRE-OPERATIVE DIAGNOSIS:  Atherosclerosis of native arteries of left lower extremity causing ulceration  POST-OPERATIVE DIAGNOSIS:  Same  PROCEDURE:   1) Ultrasound guided right common femoral artery access 2) Aortogram 3) Left lower extremity angiogram with second order cannulation (70mL total contrast) 4) Additional left lower extremity angiogram with third order cannulation 5) Left popliteal angioplasty (4x65mm Sterling) 6) Left tibioperoneal trunk angioplasty (4x88mm Sterling) 7) Left posterior tibial angioplasty (3x166mm Sterling) 8) Conscious sedation (54 minutes)   SURGEON:  Rande Brunt. Lenell Antu, MD  ASSISTANT: none  ANESTHESIA:   local and IV sedation  ESTIMATED BLOOD LOSS: minimal  LOCAL MEDICATIONS USED:  LIDOCAINE   COUNTS: confirmed correct.  PATIENT DISPOSITION:  PACU - hemodynamically stable.   Delay start of Pharmacological VTE agent (>24hrs) due to surgical blood loss or risk of bleeding: no  INDICATION FOR PROCEDURE: [name] is a 67 y.o. male with left ankle ulceration. Non-invasive studies were reassuring, but patient had recurrence of ulceration and failure of progress. After careful discussion of risks, benefits, and alternatives the patient was offered angiography. The patient understood and wished to proceed.  OPERATIVE FINDINGS:  Terminal aorta and iliac arteries: Renal arteries patent Terminal aorta patent Common iliac arteries patent Hypogastric arteries patent External iliac arteries patent  Left lower extremity: Common femoral artery: patent  Profunda femoris artery: patent  Superficial femoral artery: patent Popliteal artery: patent; distal-most artery has 60% stenosis Anterior tibial artery: diffuse disease. Tapers to occlusion shortly after origin Tibioperoneal trunk: Proximal stenosis 75% Peroneal artery: dominant - flows to the ankle and fills collaterals,  filling the foot Posterior tibial artery: multifocal stenosis proximally - greatest 90%; distal artery healthy.  Pedal circulation: healthier than expected  Follow up angiogram shows resolution of pop / TP / PT stenosis with mild spasm and improved flow to the posterior foot.  GLASS score. FP 0. IP 2. Stage 1.  WIfI score. 2 / 0 / 2. Stage 3.  DESCRIPTION OF PROCEDURE: After identification of the patient in the pre-operative holding area, the patient was transferred to the operating room. The patient was positioned supine on the operating room table. Anesthesia was induced. The groins was prepped and draped in standard fashion. A surgical pause was performed confirming correct patient, procedure, and operative location.  The right groin was anesthetized with subcutaneous injection of 1% lidocaine. Using ultrasound guidance, the right common femoral artery was accessed with micropuncture technique. Fluoroscopy was used to confirm cannulation over the femoral head. The 79F sheath was upsized to 46F.   A Benson wire was advanced into the distal aorta. Over the wire an omni flush catheter was advanced to the level of L2. Aortogram was performed - see above for details.   The left common iliac artery was selected with an omniflush catheter and Benson guidewire. The wire was advanced into the common femoral artery. Over the wire the omni flush catheter was advanced into the external iliac artery. Selective angiography was performed - see above for details.   The decision was made to intervene. The patient was heparinized with 9000 units of heparin. The 46F sheath was exchanged for a 46F x 90cm sheath. Additional selective angiography of the left lower extremity was performed prior to intervention.   The lesions were treated with: Left popliteal angioplasty (4x73mm Sterling) Left tibioperoneal trunk angioplasty (4x81mm Sterling) Left posterior tibial angioplasty (3x130mm Sterling)  Completion  angiography revealed:  Resolution  of popliteal, TP, PT stenosis.  The sheath was left in place to be removed in the recovery area.   Conscious sedation was administered with the use of IV fentanyl and midazolam under continuous physician and nurse monitoring.  Heart rate, blood pressure, and oxygen saturation were continuously monitored.  Total sedation time was 54 minutes  Upon completion of the case instrument and sharps counts were confirmed correct. The patient was transferred to the PACU in good condition. I was present for all portions of the procedure.  PLAN: ASA 81mg  PO every day. Plavix 75mg  PO every day. High intensity statin therapy. Follow up with me in 4 weeks with ABI / LLE duplex  Rande Brunt. Lenell Antu, MD [facility] Phone Number: [phone] 12/02/2022 8:44 AM

## 2022-12-02 NOTE — Progress Notes (Signed)
Up and walked and tolerated well; right groin stable, no bleeding or hematoma 

## 2022-12-05 ENCOUNTER — Encounter (HOSPITAL_BASED_OUTPATIENT_CLINIC_OR_DEPARTMENT_OTHER): Payer: BC Managed Care – PPO | Admitting: General Surgery

## 2022-12-05 ENCOUNTER — Encounter (HOSPITAL_COMMUNITY): Payer: Self-pay | Admitting: Vascular Surgery

## 2022-12-05 DIAGNOSIS — E11622 Type 2 diabetes mellitus with other skin ulcer: Secondary | ICD-10-CM | POA: Diagnosis not present

## 2022-12-05 DIAGNOSIS — L97324 Non-pressure chronic ulcer of left ankle with necrosis of bone: Secondary | ICD-10-CM | POA: Diagnosis not present

## 2022-12-05 DIAGNOSIS — L97322 Non-pressure chronic ulcer of left ankle with fat layer exposed: Secondary | ICD-10-CM | POA: Diagnosis not present

## 2022-12-05 DIAGNOSIS — L97522 Non-pressure chronic ulcer of other part of left foot with fat layer exposed: Secondary | ICD-10-CM | POA: Diagnosis not present

## 2022-12-05 DIAGNOSIS — E1151 Type 2 diabetes mellitus with diabetic peripheral angiopathy without gangrene: Secondary | ICD-10-CM | POA: Diagnosis not present

## 2022-12-05 NOTE — Progress Notes (Addendum)
LORCAN, SHELP A ([id number]) [id number] Page 1 of 9 Visit Report for 12/05/2022 Arrival Information Details Patient Name: Date of Service: NA [name] 12/05/2022 2:45 PM Medical Record Number: [id number] Patient Account Number: [id number] Date of Birth/Sex: Treating RN: 15-Apr-1956 (67 y.o. M) Primary Care Raiyan Dalesandro: [name] Other Clinician: Referring Nickalaus Crooke: Treating Katharin Schneider/Extender: [name] in Treatment: 22 Visit Information History Since Last Visit All ordered tests and consults were completed: No Patient Arrived: Knee Scooter Added or deleted any medications: No Arrival Time: 14:43 Any new allergies or adverse reactions: No Accompanied By: self Had a fall or experienced change in No Transfer Assistance: None activities of daily living that may affect Patient Identification Verified: Yes risk of falls: Secondary Verification Process Completed: Yes Signs or symptoms of abuse/neglect since last visito No Patient Requires Transmission-Based Precautions: No Hospitalized since last visit: No Patient Has Alerts: No Implantable device outside of the clinic excluding No cellular tissue based products placed in the center since last visit: Pain Present Now: No Electronic Signature(s) Signed: 12/05/2022 3:04:13 PM By: Dayton Scrape Entered By: Dayton Scrape on 12/05/2022 14:44:04 -------------------------------------------------------------------------------- Compression Therapy Details Patient Name: Date of Service: Colin Fanning A. 12/05/2022 2:45 PM Medical Record Number: [id number] Patient Account Number: [id number] Date of Birth/Sex: Treating RN: December 02, 1955 (67 y.o. [name] Primary Care Carriann Hesse: [name] Other Clinician: Referring Cathryne Mancebo: Treating Jerri Hargadon/Extender: [name]: 22 Compression Therapy Performed for Wound Assessment: Wound #2 Left,Medial  Ankle Performed By: Clinician Samuella Bruin, RN Compression Type: Double Layer Post Procedure Diagnosis Same as Pre-procedure Electronic Signature(s) Signed: 12/05/2022 4:36:32 PM By: Samuella Bruin Entered By: Samuella Bruin on 12/05/2022 15:25:23 Lasandra Beech A ([id number]) [id number] Page 2 of 9 -------------------------------------------------------------------------------- Encounter Discharge Information Details Patient Name: Date of Service: [name] 12/05/2022 2:45 PM Medical Record Number: [id number] Patient Account Number: [id number] Date of Birth/Sex: Treating RN: 1956-03-05 (67 y.o. [name] Primary Care Bruchy Mikel: [name] Other Clinician: Referring Gurney Balthazor: Treating Shaquira Moroz/Extender: [name] in Treatment: 22 Encounter Discharge Information Items Post Procedure Vitals Discharge Condition: Stable Temperature (F): 97.8 Ambulatory Status: Knee Scooter Pulse (bpm): 124 Discharge Destination: Home Respiratory Rate (breaths/min): 20 Transportation: Private Auto Blood Pressure (mmHg): 155/92 Accompanied By: self Schedule Follow-up Appointment: Yes Clinical Summary of Care: Patient Declined Electronic Signature(s) Signed: 12/05/2022 4:36:32 PM By: Samuella Bruin Entered By: Samuella Bruin on 12/05/2022 16:19:23 -------------------------------------------------------------------------------- Lower Extremity Assessment Details Patient Name: Date of Service: Colin Fanning A. 12/05/2022 2:45 PM Medical Record Number: [id number] Patient Account Number: [id number] Date of Birth/Sex: Treating RN: [birth year] (67 y.o. [name] Primary Care Camyra Vaeth: [name] Other Clinician: Referring Eusevio Schriver: Treating Baird Polinski/Extender: [name]: 22 Edema Assessment Assessed: [Left: No] [Right: No] Edema: [Left: Ye] [Right:  s] Calf Left: Right: Point of Measurement: From Medial Instep 33.5 cm Ankle Left: Right: Point of Measurement: From Medial Instep 22 cm Vascular Assessment Pulses: Dorsalis Pedis Palpable: [Left:Yes] Electronic Signature(s) Signed: 12/05/2022 4:36:32 PM By: Samuella Bruin Entered By: Samuella Bruin on 12/05/2022 15:18:22 Multi Wound Chart Details -------------------------------------------------------------------------------- [name] ([id number]) [id number] Page 3 of 9 Patient Name: Date of Service: NA [name] 12/05/2022 2:45 PM Medical Record Number: [id number] Patient Account Number: [id number] Date of Birth/Sex: Treating RN: 06/01/1956 (67 y.o. M) Primary Care Sharmaine Bain: [name] Other Clinician: Referring Donnis Pecha: Treating Laurens Matheny/Extender: [name] Weeks in  Treatment: 22 Vital Signs Height(in): 74 Capillary Blood Glucose(mg/dl): 540 Weight(lbs): 981 Pulse(bpm): 124 Body Mass Index(BMI): 24 Blood Pressure(mmHg): 155/92 Temperature(F): 97.8 Respiratory Rate(breaths/min): 20 Wound Assessments Wound Number: 2 4 N/A Photos: No Photos N/A Left, Medial Ankle Left T Fourth oe N/A Wound Location: Pressure Injury Skin T ear/Laceration N/A Wounding Event: Diabetic Wound/Ulcer of the Lower Diabetic Wound/Ulcer of the Lower N/A Primary Etiology: Extremity Extremity Cataracts, Coronary Artery Disease, Cataracts, Coronary Artery Disease, N/A Comorbid History: Hypertension, Myocardial Infarction, Hypertension, Myocardial Infarction, Peripheral Arterial Disease, Type II Peripheral Arterial Disease, Type II Diabetes, Osteomyelitis, Neuropathy Diabetes, Osteomyelitis, Neuropathy 06/13/2022 12/05/2022 N/A Date Acquired: 22 0 N/A Weeks of Treatment: Open Open N/A Wound Status: No No N/A Wound Recurrence: 0.7x1.5x0.3 1.2x1x0.1 N/A Measurements L x W x D (cm) 0.825 0.942 N/A A (cm) : rea 0.247 0.094  N/A Volume (cm) : 59.60% N/A N/A % Reduction in A rea: -21.10% N/A N/A % Reduction in Volume: Grade 3 Grade 1 N/A Classification: Medium Medium N/A Exudate A mount: Serosanguineous Serosanguineous N/A Exudate Type: red, brown red, brown N/A Exudate Color: Distinct, outline attached Distinct, outline attached N/A Wound Margin: Large (67-100%) Large (67-100%) N/A Granulation A mount: Red Red N/A Granulation Quality: Small (1-33%) None Present (0%) N/A Necrotic A mount: Fat Layer (Subcutaneous Tissue): Yes Fat Layer (Subcutaneous Tissue): Yes N/A Exposed Structures: Fascia: No Fascia: No Tendon: No Tendon: No Muscle: No Muscle: No Joint: No Joint: No Bone: No Bone: No Medium (34-66%) None N/A Epithelialization: Debridement - Selective/Open Wound N/A N/A Debridement: Pre-procedure Verification/Time Out 15:24 N/A N/A Taken: Lidocaine 4% Topical Solution N/A N/A Pain Control: Slough N/A N/A Tissue Debrided: Non-Viable Tissue N/A N/A Level: 0.82 N/A N/A Debridement A (sq cm): rea Curette N/A N/A Instrument: Minimum N/A N/A Bleeding: Pressure N/A N/A Hemostasis A chieved: Procedure was tolerated well N/A N/A Debridement Treatment Response: 0.7x1.5x0.3 N/A N/A Post Debridement Measurements L x W x D (cm) 0.247 N/A N/A Post Debridement Volume: (cm) No Abnormalities Noted No Abnormalities Noted N/A Periwound Skin Texture: Maceration: Yes No Abnormalities Noted N/A Periwound Skin Moisture: No Abnormalities Noted No Abnormalities Noted N/A Periwound Skin Color: No Abnormality No Abnormality N/A Temperature: Compression Therapy N/A N/A Procedures Performed: Debridement Treatment Notes JESSIE, SCHRIEBER A ([id number]) [phone].pdf Page 4 of 9 Electronic Signature(s) Signed: 12/05/2022 3:50:58 PM By: [name] MD FACS Entered By: [name] on 12/05/2022  15:50:58 -------------------------------------------------------------------------------- Multi-Disciplinary Care Plan Details Patient Name: Date of Service: NA Colin Nephew A. 12/05/2022 2:45 PM Medical Record Number: [id number] Patient Account Number: [id number] Date of Birth/Sex: Treating RN: 1955-11-16 (67 y.o. [name] Primary Care Najee Cowens: [name] Other Clinician: Referring Inza Mikrut: Treating Denzell Colasanti/Extender: [name] in Treatment: 22 Multidisciplinary Care Plan reviewed with physician Active Inactive Pressure Nursing Diagnoses: Knowledge deficit related to causes and risk factors for pressure ulcer development Knowledge deficit related to management of pressures ulcers Goals: Patient will remain free of pressure ulcers Date Initiated: 07/04/2022 Target Resolution Date: 01/13/2023 Goal Status: Active Interventions: Assess: immobility, friction, shearing, incontinence upon admission and as needed Assess potential for pressure ulcer upon admission and as needed Treatment Activities: Patient referred for pressure reduction/relief devices : 07/04/2022 Notes: Electronic Signature(s) Signed: 12/05/2022 4:36:32 PM By: Samuella Bruin Entered By: Samuella Bruin on 12/05/2022 16:18:37 -------------------------------------------------------------------------------- Pain Assessment Details Patient Name: Date of Service: Colin Fanning A. 12/05/2022 2:45 PM Medical Record Number: [id number] Patient Account Number: [id number] Date of Birth/Sex: Treating RN: [birth year] (67 y.o. M) Primary  Care Rakhi Romagnoli: [name] Other Clinician: Referring Tameya Kuznia: Treating Lizzie Cokley/Extender: [name]: 22 Active Problems Location of Pain Severity and Description of Pain Patient Has Paino No Site Locations Colin, WHITTER A ([id number]) [id number] Page 5 of 9 Pain Management and  Medication Current Pain Management: Electronic Signature(s) Signed: 12/05/2022 3:04:13 PM By: Dayton Scrape Entered By: Dayton Scrape on 12/05/2022 14:44:35 -------------------------------------------------------------------------------- Patient/Caregiver Education Details Patient Name: Date of Service: NA [name] 6/24/2024andnbsp2:45 PM Medical Record Number: [id number] Patient Account Number: [id number] Date of Birth/Gender: Treating RN: 10-12-1955 (67 y.o. [name] Primary Care Physician: [name] Other Clinician: Referring Physician: Treating Physician/Extender: [name] in Treatment: 22 Education Assessment Education Provided To: Patient Education Topics Provided Wound/Skin Impairment: Methods: Explain/Verbal Responses: Reinforcements needed, State content correctly Electronic Signature(s) Signed: 12/05/2022 4:36:32 PM By: Samuella Bruin Entered By: Samuella Bruin on 12/05/2022 16:18:48 -------------------------------------------------------------------------------- Wound Assessment Details Patient Name: Date of Service: NA Colin Nephew A. 12/05/2022 2:45 PM Medical Record Number: [id number] Patient Account Number: [id number] Date of Birth/Sex: Treating RN: [birth year] (67 y.o. [name] Primary Care Isidora Laham: [name] Other Clinician: Referring Ronit Cranfield: Treating Seydou Hearns/Extender: Fouad, Taul A ([id number]) [id number] Page 6 of 9 Weeks in Treatment: 22 Wound Status Wound Number: 2 Primary Diabetic Wound/Ulcer of the Lower Extremity Etiology: Wound Location: Left, Medial Ankle Wound Open Wounding Event: Pressure Injury Status: Date Acquired: 06/13/2022 Comorbid Cataracts, Coronary Artery Disease, Hypertension, Myocardial Weeks Of Treatment: 22 History: Infarction, Peripheral Arterial Disease, Type II Diabetes, Clustered Wound: No Osteomyelitis,  Neuropathy Photos Wound Measurements Length: (cm) 0.7 Width: (cm) 1.5 Depth: (cm) 0.3 Area: (cm) 0.825 Volume: (cm) 0.247 % Reduction in Area: 59.6% % Reduction in Volume: -21.1% Epithelialization: Medium (34-66%) Tunneling: No Undermining: No Wound Description Classification: Grade 3 Wound Margin: Distinct, outline attached Exudate Amount: Medium Exudate Type: Serosanguineous Exudate Color: red, brown Foul Odor After Cleansing: No Slough/Fibrino Yes Wound Bed Granulation Amount: Large (67-100%) Exposed Structure Granulation Quality: Red Fascia Exposed: No Necrotic Amount: Small (1-33%) Fat Layer (Subcutaneous Tissue) Exposed: Yes Necrotic Quality: Adherent Slough Tendon Exposed: No Muscle Exposed: No Joint Exposed: No Bone Exposed: No Periwound Skin Texture Texture Color No Abnormalities Noted: Yes No Abnormalities Noted: Yes Moisture Temperature / Pain No Abnormalities Noted: No Temperature: No Abnormality Maceration: Yes Treatment Notes Wound #2 (Ankle) Wound Laterality: Left, Medial Cleanser Normal Saline Discharge Instruction: Cleanse the wound with Normal Saline prior to applying a clean dressing using gauze sponges, not tissue or cotton balls. Peri-Wound Care Zinc Oxide Ointment 30g tube Discharge Instruction: Apply Zinc Oxide to periwound with each dressing change Sween Lotion (Moisturizing lotion) Discharge Instruction: Apply moisturizing lotion as directed Topical Gentamicin Discharge Instruction: As directed by physician Colin, BELLUCCI A ([id number]) [id number] Page 7 of 9 Mupirocin Ointment Discharge Instruction: Apply Mupirocin (Bactroban) as instructed Primary Dressing Hydrofera Blue Ready Transfer Foam, 2.5x2.5 (in/in) Discharge Instruction: Apply directly to wound bed as directed Secondary Dressing Woven Gauze Sponge, Non-Sterile 4x4 in Discharge Instruction: Apply over primary dressing as directed. Zetuvit Plus  4x8 in Discharge Instruction: Apply over primary dressing as directed. Secured With Transpore Surgical Tape, 2x10 (in/yd) Discharge Instruction: Secure dressing with tape as directed. Compression Wrap Urgo K2 Lite, (equivalent to a 3 layer) two layer compression system, regular Discharge Instruction: Apply Urgo K2 Lite as directed (alternative to 3 layer compression). Compression Stockings Add-Ons Electronic Signature(s) Signed: 12/05/2022 4:36:32 PM By: Samuella Bruin Previous Signature: 12/05/2022 3:04:13 PM Version  By: Dayton Scrape Entered By: Samuella Bruin on 12/05/2022 15:21:33 -------------------------------------------------------------------------------- Wound Assessment Details Patient Name: Date of Service: Colin Fanning A. 12/05/2022 2:45 PM Medical Record Number: [id number] Patient Account Number: [id number] Date of Birth/Sex: Treating RN: 1955/11/02 (67 y.o. [name] Primary Care Fama Muenchow: [name] Other Clinician: Referring Nicholette Dolson: Treating Apolinar Bero/Extender: [name]: 22 Wound Status Wound Number: 4 Primary Diabetic Wound/Ulcer of the Lower Extremity Etiology: Wound Location: Left T Fourth oe Wound Open Wounding Event: Skin Tear/Laceration Status: Date Acquired: 12/05/2022 Comorbid Cataracts, Coronary Artery Disease, Hypertension, Myocardial Weeks Of Treatment: 0 History: Infarction, Peripheral Arterial Disease, Type II Diabetes, Clustered Wound: No Osteomyelitis, Neuropathy Wound Measurements Length: (cm) 1.2 Width: (cm) 1 Depth: (cm) 0.1 Area: (cm) 0.942 Volume: (cm) 0.094 % Reduction in Area: % Reduction in Volume: Epithelialization: None Tunneling: No Undermining: No Wound Description Classification: Grade 1 Wound Margin: Distinct, outline attached Exudate Amount: Medium Exudate Type: Serosanguineous Exudate Color: red, brown Foul Odor After Cleansing: No Slough/Fibrino No Wound  Bed Granulation Amount: Large (67-100%) Exposed Structure Granulation Quality: Red Fascia Exposed: No Necrotic Amount: None Present (0%) Fat Layer (Subcutaneous Tissue) Exposed: Yes Tendon Exposed: No [name], Colin A ([id number]) [id number] Page 8 of 9 Muscle Exposed: No Joint Exposed: No Bone Exposed: No Periwound Skin Texture Texture Color No Abnormalities Noted: Yes No Abnormalities Noted: Yes Moisture Temperature / Pain No Abnormalities Noted: Yes Temperature: No Abnormality Treatment Notes Wound #4 (Toe Fourth) Wound Laterality: Left Cleanser Soap and Water Discharge Instruction: May shower and wash wound with dial antibacterial soap and water prior to dressing change. Wound Cleanser Discharge Instruction: Cleanse the wound with wound cleanser prior to applying a clean dressing using gauze sponges, not tissue or cotton balls. Peri-Wound Care Topical Primary Dressing Maxorb Extra Ag+ Alginate Dressing, 2x2 (in/in) Discharge Instruction: Apply to wound bed as instructed Secondary Dressing Woven Gauze Sponges 2x2 in Discharge Instruction: Apply over primary dressing as directed. Secured With Conforming Stretch Gauze Bandage Roll, Sterile 4x75 (in/in) Discharge Instruction: Secure with stretch gauze as directed. Compression Wrap Compression Stockings Add-Ons Electronic Signature(s) Signed: 12/05/2022 4:36:32 PM By: Samuella Bruin Entered By: Samuella Bruin on 12/05/2022 15:27:29 -------------------------------------------------------------------------------- Vitals Details Patient Name: Date of Service: NA Colin [name] 12/05/2022 2:45 PM Medical Record Number: [id number] Patient Account Number: [id number] Date of Birth/Sex: Treating RN: [birth year] (67 y.o. M) Primary Care Sharry Beining: [name] Other Clinician: Referring Yudit Modesitt: Treating Jahniah Pallas/Extender: [name]: 22 Vital Signs Time  Taken: 02:44 Temperature (F): 97.8 Height (in): 74 Pulse (bpm): 124 Weight (lbs): 187 Respiratory Rate (breaths/min): 20 Body Mass Index (BMI): 24 Blood Pressure (mmHg): 155/92 Capillary Blood Glucose (mg/dl): 644 Reference Range: 80 - 120 mg / dl Electronic Signature(s) Signed: 12/05/2022 3:04:13 PM By: Mickel Baas, Signed: 12/05/2022 3:04:13 PM By: Abundio Miu ([id number]) [id number] Page 9 of 9 Entered By: Dayton Scrape on 12/05/2022 14:44:30

## 2022-12-05 NOTE — Progress Notes (Signed)
GERADO, [name] [name] ([id number]) [id number] Page 1 of 13 Visit Report for 12/05/2022 Chief Complaint Document Details Patient Name: Date of Service: Colin [name] 12/05/2022 2:45 PM Medical Record Number: [id number] Patient Account Number: [id number] Date of Birth/Sex: Treating RN: 12-Aug-1955 (67 y.o. M) Primary Care Provider: [name] Other Clinician: Referring Provider: Treating Provider/Extender: Colin [name] in Treatment: 22 Information Obtained from: Patient Chief Complaint Patients presents for treatment of an open diabetic ulcer with exposed bone and osteomyelitis 07/04/2022: Diabetic ankle ulcer Electronic Signature(s) Signed: 12/05/2022 3:51:06 PM By: Colin Guess MD FACS Entered By: Colin [name] on 12/05/2022 15:51:06 -------------------------------------------------------------------------------- Debridement Details Patient Name: Date of Service: Colin Colin Nephew [name]. 12/05/2022 2:45 PM Medical Record Number: [id number] Patient Account Number: [id number] Date of Birth/Sex: Treating RN: 08-30-1955 (67 y.o. Colin [name] Primary Care Provider: [name] Other Clinician: Referring Provider: Treating Provider/Extender: Colin [name] Weeks in Treatment: 22 Debridement Performed for Assessment: Wound #2 Left,Medial Ankle Performed By: Physician Colin Guess, MD Debridement Type: Debridement Severity of Tissue Pre Debridement: Fat layer exposed Level of Consciousness (Pre-procedure): Awake and Alert Pre-procedure Verification/Time Out Yes - 15:24 Taken: Start Time: 15:24 Pain Control: Lidocaine 4% T opical Solution Percent of Wound Bed Debrided: 100% T Area Debrided (cm): otal 0.82 Tissue and other material debrided: Non-Viable, Slough, Slough Level: Non-Viable Tissue Debridement Description: Selective/Open Wound Instrument: Curette Bleeding: Minimum Hemostasis Achieved:  Pressure Response to Treatment: Procedure was tolerated well Level of Consciousness (Post- Awake and Alert procedure): Post Debridement Measurements of Total Wound Length: (cm) 0.7 Width: (cm) 1.5 Depth: (cm) 0.3 Volume: (cm) 0.247 Character of Wound/Ulcer Post Debridement: Improved Severity of Tissue Post Debridement: Fat layer exposed Colin [name], Colin [name] ([id number]) [id number].pdf Page 2 of 13 Post Procedure Diagnosis Same as Pre-procedure Notes Scribed for Dr. Lady [name] by Colin Bruin, RN Electronic Signature(s) Signed: 12/05/2022 4:31:09 PM By: Colin Guess MD FACS Signed: 12/05/2022 4:36:32 PM By: Colin [name] Entered By: Colin [name] on 12/05/2022 15:25:28 -------------------------------------------------------------------------------- HPI Details Patient Name: Date of Service: Colin Colin Nephew [name]. 12/05/2022 2:45 PM Medical Record Number: [id number] Patient Account Number: [id number] Date of Birth/Sex: Treating RN: [birth year] (67 y.o. M) Primary Care Provider: [name] Other Clinician: Referring Provider: Treating Provider/Extender: Colin [name] in Treatment: 22 History of Present Illness HPI Description: ADMISSION 08/25/2021 This is [name] 67 year old man who initially presented to his primary care provider in September 2022 with pain in his left foot. He was sent for an x-ray and while the x-ray was being performed, the tech pointed out [name] wound on his foot that the patient was not aware existed. He does have type 2 diabetes with significant neuropathy. His diabetes is suboptimally controlled with his most recent A1c being 8.5. He also has [name] history of coronary artery disease status post three- vessel CABG. he was initially seen by orthopedics, but they referred him to [facility]. He has undergone at least 7 operations/debridements and several applications of skin substitute under the care of  podiatry. He has been in [name] for much of this time. His most recent procedure was July 28, 2021. [name] was biopsied and was found to be consistent with osteomyelitis. Culture also returned positive for corynebacterium. He was seen on August 16, 2021 by infectious disease. [name] PICC line has been placed and he will be receiving [name] 6-week course of IV daptomycin and cefepime. In October 2022, he  underwent lower extremity vascular studies. Results are copied here: Right: Resting right ankle-brachial index is within normal range. No evidence of significant right lower extremity arterial disease. The right toe-brachial index is abnormal. Left: Resting left ankle-brachial index indicates mild left lower extremity arterial disease. The left toe-brachial index is abnormal. He has not been seen by vascular surgery despite these findings. He presented to clinic today in [name] and is using [name] to offload. Wound VAC was in place. Once this was removed, [name] large ulcer was identified on the left midfoot/ankle. Bone is frankly exposed. There is no malodorous or purulent drainage. There is some granulation tissue over the central portion of the exposed bone. There is [name] tunnel that extends posteriorly for roughly 10 cm. It has been discussed with him by multiple providers that he is at very high risk of losing his lower leg because of this wound. He is extremely eager to avoid this outcome and is here today to review his options as well as receive ongoing wound care. 09/03/2021: Here for reevaluation of his wound. There does not appear to have been any substantial improvement overall since our last visit. He has been in [name] with white foam overlying the exposed bone. We are working on getting him approved for hyperbaric oxygen therapy. 09/10/2021: We are in the process of getting him cleared to begin hyperbaric oxygen therapy. He still needs to obtain [name] chest x-ray.  Although the wound measurements are roughly the same, I think the overall appearance of the wound is better. The exposed bone has [name] bit more granulation tissue covering it. He has not received [name] vascular surgery appointment to reevaluate his flow to the wound. 09/17/2021: He has been approved for hyperbaric oxygen therapy and completed his chest x-ray, which I reviewed and it appears normal. The tunnels at the 12 and 10:00 positions are smaller. There is more granulation tissue covering the exposed bone and the undermining has decreased. He still has not received [name] vascular surgery appointment. 09/24/2021: He initiated hyperbaric oxygen therapy this week and is tolerating it well. He has an appointment with vascular surgery coming up on May 16. The granulation tissue is covering more of the exposed bone and both tunnels are [name] bit smaller. 10/01/2021: He continues to tolerate hyperbaric oxygen therapy. He saw infectious disease and they are planning to pull his PICC line. He has been initiated on oral antibiotics (doxycycline and Augmentin). The wound looks about the same but the tunnels are [name] little bit smaller. The skin seems to be contracting somewhat around the exposed bone. 10/08/2021: The wound is still about the same size, but the tunnels continue to come in and the skin is contracting around the exposed bone. He continues to have some accumulation of necrotic material in the inferoposterior aspect of the wound as well as accumulation at the 12:00 tunnel area. 10/15/2021: The wound is smaller today. The tunnels continue to come in. There is less necrotic tissue present. He does have some periwound maceration. 10/22/2021: The wound is about the same size. There is [name] little bit less undermining at the distal portion. The exposed bone is dark and I am not sure if this is staining from silver nitrate or his VAC sponge or if it represents necrosis. The tunnels are shallower but he does have some serous  drainage coming from the [facility] [name] ([id number]) [id number].pdf Page 3 of 13 10:00 tunnel. He continues to tolerate hyperbaric oxygen therapy well.  10/29/2021: The undermining continues to improve. The tunnels are about the same. He has good granulation tissue overlying the majority of the exposed bone. It does appear that perhaps the tubing from his wound VAC has been eroding the skin at the 12 clock position. He continues to accumulate senescent epithelium around the borders of the wound. 11/05/2021: The undermining is almost completely resolved. The tunnels have contracted fairly significantly. No significant slough or debris accumulation. There is still senescent epithelium accumulation around the borders of the wound. He has been tolerating hyperbaric oxygen therapy well. 11/12/2021: Despite the measurements of the wound being about the same, the wound has changed in its shape and overall, I think it is improved. The undermining has resolved and the tunnels continue to shorten. There is good granulation tissue encroaching over the small area of bone that has remained exposed at the 12 o'clock position. Minimal slough accumulation. He continues to tolerate hyperbaric oxygen therapy well. 11/19/2021: I took [name] PCR culture last week. There was overgrowth of yeast. He is already taking suppressive doxycycline and Augmentin. I added fluconazole to his regimen. The wound is smaller again today. The tunnels continue to shorten. He continues to do well with hyperbaric oxygen therapy. 11/26/2021: For some reason, his foot has become macerated. The wound is narrower but about the same dimensions in its longitudinal aspect. The tunnels continue to shorten. He has some slough buildup on the wound as well as some heaped up senescent epithelium around the perimeter. 12/03/2021: No further maceration of his foot has occurred. The wound has contracted quite significantly from last week. The  tunnel at 10:00 is closed. The tunnel at 12:00 is down to just [name] couple of millimeters. No other undermining is present. There is soft tissue coverage of the previously exposed bone. There is just [name] bit of slough and biofilm on the wound surface. 12/10/2021: The wound is looking good. It turns out the tunnel at 12:00 is only exposed when the patient dorsiflexes his foot. It is about 2 cm in depth when he does this; when his foot is in plantarflexion, the tunnel is closed. The bone that was visible at the 12:00 tunnel is completely covered with granulation tissue, but there does feel like some exposed bone deeper into the tunnel area. There is senescent skin heaped up around the periphery. Minimal slough on the wound surface. 12/16/2021: The wound dimensions are roughly the same. The surface has nice granulation tissue. The exposed bone at the 12:00 tunnel continues to be covered with more soft tissue. 7/14; patient's wound measures smaller today. Using the wound VAC with underlying collagen. He is also being treated with HBO for underlying osteomyelitis. He tells me he is on doxycycline and ampicillin follows with infectious disease next week 12/31/2021: The wound continues to contract. Unfortunately, the area where the track pad and tubing have been rubbing continues to look like it is applying friction. He says that the home health nurses that have been applying the [facility] have been putting gauze underneath the tubing, but nonetheless there is ongoing tissue breakdown at this site. Light slough accumulation on the wound surface. The tunnel continues to contract. He is tolerating HBO without difficulty. 01/07/2022: Bridging the wound VAC away from the ankle has resulted in significant improvement in the tissue at the apex of the wound. The tunnel is still present and is not all that much shorter, but the overall wound surface is very robust and healthy looking. Minimal slough accumulation. No concern for  acute infection. 01/21/2022: The wound continues to contract and has [name] robust granulation tissue surface. The tunnel has come in considerably and is down to about 1.4 cm. There is still bone exposed within the tunnel but the rest of it is well covered. There is some senescent epithelium at the wound margins and [name] little bit of slough on the surface. 01/28/2022: No significant change in the wound this week, but there has not been any reaccumulation of senescent epithelium or slough. The tunnel is perhaps [name] millimeter less in depth. He has been approved for Apligraf and we will apply this today. 02/04/2022: The wound has contracted somewhat and the tunnel has filled in completely. The wound surface is clean. He is here for Apligraf #2. 02/11/2022: The wound has contracted further and is now nearly flush with the surrounding skin surface. Light layer of slough. Apligraf #3 plan for today. 02/18/2022: There is [name] band of epithelium trying to cut across the superior portion of the wound. There is robust granulation tissue with just [name] light layer of slough and biofilm on the surface. There was [name] little bit of greenish drainage in the wound VAC but none appreciated on the site itself. He is here for Apligraf #4. 03/04/2022: The wound has contracted considerably. There is good granulation tissue on the surface. Minimal biofilm. He is here for Apligraf #5. 03/18/2022: The wound has epithelialized to the point that it has been divided into 2 areas. Both areas are smaller in total than at his previous visit. There is good granulation tissue present with just [name] little bit of periwound eschar and surface slough. 10/13; left medial foot. The patient has completed treatment with hyperbaric oxygen for underlying osteomyelitis he has had [name] nice response in the wound. Noted today that he still had some greenish drainage. Previously he has been treated with topical gentamicin but apparently that was stopped 2 weeks  ago. Fortunately the wound is measuring smaller 10/20; wound looks better no hypergranulation minimal drainage. Granulation tissue looks healthy using gentamicin Hydrofera Blue under compression 04/08/2022: The wound continues to contract tremendously. The response to his treatment for hypertrophic granulation tissue has been quite remarkable. There are just [name] couple of open areas remaining. 04/18/2022: His wound is healed. READMISSION 07/04/2022: He returns to clinic with [name] new ulcer adjacent to where his previous large wound had been. He says that he was wearing boots and using [name] small padded dressing to protect the freshly healed ankle wound. Apparently the dressing rolled up and rubbed on his skin, causing [name] new wound. He contacted our office last week concerned about the appearance. There is [name] small oval wound on his left medial ankle. It is fairly clean with some periwound dry skin and eschar. 07/11/2022: The wound is about the same size this week. There is [name] layer of rubbery slough on the surface. It has [name] fairly strong odor. 07/18/2022: The wound measured larger this week due to small satellite areas opening in [name] fan pattern distal to the primary wound. He continues to accumulate slough on the wound surface. The culture that I took last week only grew out low levels of skin flora. 07/25/2022: The satellite areas that had opened last week have closed. The main wound is smaller with some slough accumulation. 08/01/2022: The wound continues to contract. There is [name] layer of slough on the surface. 08/08/2022: The wound is [name] little bit smaller again this week. Still with slough buildup. 3/18; patient presents for follow-up. He has  been doing dressing changes on his own with silver alginate and antibiotic ointment. He has been at [employer] over the past 3 weeks. He has developed [name] new wound. Colin [name], Colin [name] ([id number]) [id number] Page 4 of 13 09/05/2022: I am not sure if  the new wound that was reported last week is just the satellite area that had closed [name] couple of weeks ago and reopened. Regardless, both wounds are quite small with good epithelialization. There is some eschar buildup around the periphery of both wounds with slough on both wound surfaces. 09/12/2022: The wounds have converged. There is some increased in depth at the distal portion. This seems to be secondary to moisture accumulation as the periwound skin is [name] bit macerated. 09/26/2022: The wound has deteriorated further. There is increased depth at the distal portion and the wound probes to bone. The patient theorizes that the boot he has been wearing is rubbing in this spot and upon inspection of the boot, there is actually [name] hard plastic boot in exactly this location, supporting his theory. 10/05/2022: The wound looks [name] little bit better this week. The area that probed to bone now has some tissue coverage. There is still some slough buildup on the wound surfaces. 10/10/2022: The wound is strongly malodorous today. There is no purulent drainage, nor any increased erythema. There is some tissue maceration around the wound, but he did not have [name] whole lot of drainage on his dressings. There is slough on the wound surface. 10/17/2022: The wound still has [name] fairly strong odor and the drainage has increased. It is goopy and purulent. The culture that I took last week returned late on Thursday with methicillin and tetracycline resistant staph epidermidis and multiple other staph species at fairly high concentrations. 10/27/2022: The wound has cleaned up considerably. There is no odor and less drainage. The drainage is no longer purulent. He is taking Bactrim as prescribed. 11/03/2022: He completed his course of Bactrim however the wound has deteriorated, has [name] foul odor, and there are fragments of bone breaking off in his wound. His drainage was dark and purulent. 11/08/2022: As expected, the bone biopsy returned  with osteomyelitis and osteonecrosis. His culture returned with [name] number of species with the Levaquin I had already prescribed being along with the first-line choice as suggested by the pharmacist. Notably, however, Pseudomonas aeruginosa was not among the species cultivated. He has an appointment to see Dr. Daiva Eves on Wednesday the 19th; he has vascular follow-up on Thursday the sixth. The wound actually looks [name] little bit better today. There are no chunks of bone falling out, nor am I actually able to probe or palpate bone. He did have [name] bit more drainage, but this was noted as murky or purulent as at his previous visit. There is slough buildup on the wound surface and the granulation tissue present is [name] little bit hypertrophic and friable. 11/15/2022: The wound actually looks [name] little bit better today. I can still palpate bone with his foot in [name] relaxed, plantarflexed position, but there does seem to be some tissue covering it. The drainage has improved. He has completed the oral levofloxacin that I prescribed. He is going to see vascular surgery on Thursday and has an appointment with infectious disease on Wednesday the 19th. 11/21/2022: There is just [name] tiny little spike of bone that is still palpable. Everything else looks improved. He is having an angiogram next Friday to see if he can be optimized from an arterial inflow perspective.  11/28/2022: The bone is completely covered with soft tissue. The wound is smaller. 12/05/2022: He had his angiogram last week and 3 vessels were in his lower leg was stented. The ankle wound is smaller again today with minimal slough accumulation. He managed to tear his toenail off of his fourth toe today when he was removing his socks. This has left [name] wound with the nailbed/fat layer exposed. Electronic Signature(s) Signed: 12/05/2022 3:52:47 PM By: Colin Guess MD FACS Entered By: Colin [name] on 12/05/2022  15:52:47 -------------------------------------------------------------------------------- Physical Exam Details Patient Name: Date of Service: Colin Colin Nephew [name]. 12/05/2022 2:45 PM Medical Record Number: [id number] Patient Account Number: [id number] Date of Birth/Sex: Treating RN: 05-25-1956 (67 y.o. M) Primary Care Provider: [name] Other Clinician: Referring Provider: Treating Provider/Extender: Colin [name] Weeks in Treatment: 22 Constitutional Hypertensive, asymptomatic. Tachycardic, asymptomatic. . . no acute distress. Respiratory Normal work of breathing on room air. Notes 12/05/2022: The ankle wound is smaller again today with minimal slough accumulation. He managed to tear his toenail off of his fourth toe today when he was removing his socks. This has left [name] wound with the nailbed/fat layer exposed. Electronic Signature(s) Signed: 12/05/2022 3:53:58 PM By: Colin Guess MD FACS Entered By: Colin [name] on 12/05/2022 15:53:57 Colin [name] ([id number]) [id number] Page 5 of 13 -------------------------------------------------------------------------------- Physician Orders Details Patient Name: Date of Service: Colin [name] 12/05/2022 2:45 PM Medical Record Number: [id number] Patient Account Number: [id number] Date of Birth/Sex: Treating RN: 10/13/1955 (67 y.o. 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 - Dr.  Lady [name] - room 2 Return [name] Bathing/ Shower/ Hygiene May shower with protection but do not get wound dressing(s) wet. Protect dressing(s) with water repellant cover (for example, large plastic bag) or [name] cast cover and may then take shower. Edema Control - Lymphedema / SCD / Other Elevate legs to the level of the heart or above for 30 minutes daily and/or when sitting for 3-4 times [name] day throughout the day. Avoid standing for long periods of time. Off-Loading Open toe surgical shoe to: - left foot Other: - knee scooter to left leg, stop wearing your orthoboot Wound Treatment Wound #2 - Ankle Wound Laterality: Left, Medial Cleanser: Normal Saline 1 x Per Week/30 Days Discharge Instructions: Cleanse the wound with Normal Saline prior to applying [name] clean dressing using gauze sponges, not tissue or cotton balls. Peri-Wound Care: Zinc Oxide Ointment 30g tube 1 x Per Week/30 Days Discharge Instructions: Apply Zinc Oxide to periwound with each dressing change Peri-Wound Care: Sween Lotion (Moisturizing lotion) 1 x Per Week/30 Days Discharge Instructions: Apply moisturizing lotion as directed Topical: Gentamicin 1 x Per Week/30 Days Discharge Instructions: As directed by physician Topical: Mupirocin Ointment 1 x Per Week/30 Days Discharge Instructions: Apply Mupirocin (Bactroban) as instructed Prim Dressing: Hydrofera Blue Ready Transfer Foam, 2.5x2.5 (in/in) 1 x Per Week/30 Days ary Discharge Instructions: Apply directly to wound bed as directed Secondary Dressing: Woven Gauze Sponge, Non-Sterile 4x4 in 1 x Per Week/30 Days Discharge Instructions: Apply over primary dressing as directed. Secondary Dressing: Zetuvit  Plus 4x8 in (Generic) 1 x Per Week/30 Days Discharge Instructions: Apply over primary dressing as directed. Secured With: Transpore Surgical Tape, 2x10 (in/yd) 1 x Per Week/30 Days Discharge Instructions: Secure dressing with tape as directed. Compression Wrap: Urgo K2 Lite,  (equivalent to [name] 3 layer) two layer compression system, regular 1 x Per Week/30 Days Discharge Instructions: Apply Urgo K2 Lite as directed (alternative to 3 layer compression). Wound #4 - T Fourth oe Wound Laterality: Left Cleanser: Soap and Water 1 x Per Day/30 Days Discharge Instructions: May shower and wash wound with dial antibacterial soap and water prior to dressing change. Colin [name], Colin [name] ([id number]) [id number] Page 6 of 13 Cleanser: Wound Cleanser 1 x Per Day/30 Days Discharge Instructions: Cleanse the wound with wound cleanser prior to applying [name] clean dressing using gauze sponges, not tissue or cotton balls. Prim Dressing: Maxorb Extra Ag+ Alginate Dressing, 2x2 (in/in) 1 x Per Day/30 Days ary Discharge Instructions: Apply to wound bed as instructed Secondary Dressing: Woven Gauze Sponges 2x2 in 1 x Per Day/30 Days Discharge Instructions: Apply over primary dressing as directed. Secured With: Web designer, Sterile 4x75 (in/in) 1 x Per Day/30 Days Discharge Instructions: Secure with stretch gauze as directed. Electronic Signature(s) Signed: 12/05/2022 4:31:09 PM By: Colin Guess MD FACS Entered By: Colin [name] on 12/05/2022 15:54:14 -------------------------------------------------------------------------------- Problem List Details Patient Name: Date of Service: Colin Colin Nephew [name]. 12/05/2022 2:45 PM Medical Record Number: [id number] Patient Account Number: [id number] Date of Birth/Sex: Treating RN: [birth year] (67 y.o. 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 07/04/2022 No Yes L97.522 Non-pressure chronic ulcer of other part of left foot with fat layer  exposed 12/05/2022 No Yes I73.9 Peripheral vascular disease, unspecified 07/04/2022 No Yes E11.622 Type 2 diabetes mellitus with other skin ulcer 07/04/2022 No Yes Inactive Problems Resolved Problems Electronic Signature(s) Signed: 12/05/2022 3:50:03 PM By: Colin Guess MD FACS Entered By: Colin [name] on 12/05/2022 15:50:03 Colin [name] ([id number]) [id number] Page 7 of 13 -------------------------------------------------------------------------------- Progress Note Details Patient Name: Date of Service: Colin [name] 12/05/2022 2:45 PM Medical Record Number: [id number] Patient Account Number: [id number] Date of Birth/Sex: Treating RN: [birth year] (67 y.o. M) Primary Care Provider: [name] Other Clinician: Referring Provider: Treating Provider/Extender: Colin [name] in Treatment: 22 Subjective Chief Complaint Information obtained from Patient Patients presents for treatment of an open diabetic ulcer with exposed bone and osteomyelitis 07/04/2022: Diabetic ankle ulcer History of Present Illness (HPI) ADMISSION 08/25/2021 This is [name] 67 year old man who initially presented to his primary care provider in September 2022 with pain in his left foot. He was sent for an x-ray and while the x-ray was being performed, the tech pointed out [name] wound on his foot that the patient was not aware existed. He does have type 2 diabetes with significant neuropathy. His diabetes is suboptimally controlled with his most recent A1c being 8.5. He also has [name] history of coronary artery disease status post three- vessel CABG. he was initially seen by orthopedics, but they referred him to [facility]. He has undergone at least 7 operations/debridements and several applications of skin substitute under the care of podiatry. He has been in [name] for much of this time. His most recent procedure was July 28, 2021. [name] portion of  the talus was biopsied and was found to be consistent with osteomyelitis. Culture also returned positive for corynebacterium. He was seen on August 16, 2021 by infectious disease. [name] PICC line has been placed and he will be receiving [name] 6-week course of IV daptomycin and cefepime. In October 2022, he underwent lower extremity vascular studies. Results are copied here: Right: Resting right ankle-brachial index is within normal range. No evidence of significant right lower extremity arterial disease. The right toe-brachial index is abnormal. Left: Resting left ankle-brachial index indicates mild left lower extremity arterial disease. The left toe-brachial index is abnormal. He has not been seen by vascular surgery despite these findings. He presented to clinic today in [name] and is using [name] to offload. Wound VAC was in place. Once this was removed, [name] large ulcer was identified on the left midfoot/ankle. Bone is frankly exposed. There is no malodorous or purulent drainage. There is some granulation tissue over the central portion of the exposed bone. There is [name] tunnel that extends posteriorly for roughly 10 cm. It has been discussed with him by multiple providers that he is at very high risk of losing his lower leg because of this wound. He is extremely eager to avoid this outcome and is here today to review his options as well as receive ongoing wound care. 09/03/2021: Here for reevaluation of his wound. There does not appear to have been any substantial improvement overall since our last visit. He has been in [name] with white foam overlying the exposed bone. We are working on getting him approved for hyperbaric oxygen therapy. 09/10/2021: We are in the process of getting him cleared to begin hyperbaric oxygen therapy. He still needs to obtain [name] chest x-ray. Although the wound measurements are roughly the same, I think the overall appearance of the wound is better. The exposed bone  has [name] bit more granulation tissue covering it. He has not received [name] vascular surgery appointment to reevaluate his flow to the wound. 09/17/2021: He has been approved for hyperbaric oxygen therapy and completed his chest x-ray, which I reviewed and it appears normal. The tunnels at the 12 and 10:00 positions are smaller. There is more granulation tissue covering the exposed bone and the undermining has decreased. He still has not received [name] vascular surgery appointment. 09/24/2021: He initiated hyperbaric oxygen therapy this week and is tolerating it well. He has an appointment with vascular surgery coming up on May 16. The granulation tissue is covering more of the exposed bone and both tunnels are [name] bit smaller. 10/01/2021: He continues to tolerate hyperbaric oxygen therapy. He saw infectious disease and they are planning to pull his PICC line. He has been initiated on oral antibiotics (doxycycline and Augmentin). The wound looks about the same but the tunnels are [name] little bit smaller. The skin seems to be contracting somewhat around the exposed bone. 10/08/2021: The wound is still about the same size, but the tunnels continue to come in and the skin is contracting around the exposed bone. He continues to have some accumulation of necrotic material in the inferoposterior aspect of the wound as well as accumulation at the 12:00 tunnel area. 10/15/2021: The wound is smaller today. The tunnels continue to come in. There is less necrotic tissue present. He does have some periwound maceration. 10/22/2021: The wound is about the same size. There is [name] little bit less undermining at the distal portion. The exposed bone  is dark and I am not sure if this is staining from silver nitrate or his VAC sponge or if it represents necrosis. The tunnels are shallower but he does have some serous drainage coming from the 10:00 tunnel. He continues to tolerate hyperbaric oxygen therapy well. 10/29/2021: The undermining  continues to improve. The tunnels are about the same. He has good granulation tissue overlying the majority of the exposed bone. It does appear that perhaps the tubing from his wound VAC has been eroding the skin at the 12 clock position. He continues to accumulate senescent epithelium around the borders of the wound. 11/05/2021: The undermining is almost completely resolved. The tunnels have contracted fairly significantly. No significant slough or debris accumulation. There is still senescent epithelium accumulation around the borders of the wound. He has been tolerating hyperbaric oxygen therapy well. 11/12/2021: Despite the measurements of the wound being about the same, the wound has changed in its shape and overall, I think it is improved. The undermining has resolved and the tunnels continue to shorten. There is good granulation tissue encroaching over the small area of bone that has remained exposed at the 12 o'clock position. Minimal slough accumulation. He continues to tolerate hyperbaric oxygen therapy well. 11/19/2021: I took [name] PCR culture last week. There was overgrowth of yeast. He is already taking suppressive doxycycline and Augmentin. I added fluconazole to his regimen. The wound is smaller again today. The tunnels continue to shorten. He continues to do well with hyperbaric oxygen therapy. 11/26/2021: For some reason, his foot has become macerated. The wound is narrower but about the same dimensions in its longitudinal aspect. The tunnels continue to shorten. He has some slough buildup on the wound as well as some heaped up senescent epithelium around the perimeter. 12/03/2021: No further maceration of his foot has occurred. The wound has contracted quite significantly from last week. The tunnel at 10:00 is closed. The Colin [name], Colin [name] [id number] Page 8 of 13 tunnel at 12:00 is down to just [name] couple of millimeters. No other undermining is present.  There is soft tissue coverage of the previously exposed bone. There is just [name] bit of slough and biofilm on the wound surface. 12/10/2021: The wound is looking good. It turns out the tunnel at 12:00 is only exposed when the patient dorsiflexes his foot. It is about 2 cm in depth when he does this; when his foot is in plantarflexion, the tunnel is closed. The bone that was visible at the 12:00 tunnel is completely covered with granulation tissue, but there does feel like some exposed bone deeper into the tunnel area. There is senescent skin heaped up around the periphery. Minimal slough on the wound surface. 12/16/2021: The wound dimensions are roughly the same. The surface has nice granulation tissue. The exposed bone at the 12:00 tunnel continues to be covered with more soft tissue. 7/14; patient's wound measures smaller today. Using the wound VAC with underlying collagen. He is also being treated with HBO for underlying osteomyelitis. He tells me he is on doxycycline and ampicillin follows with infectious disease next week 12/31/2021: The wound continues to contract. Unfortunately, the area where the track pad and tubing have been rubbing continues to look like it is applying friction. He says that the home health nurses that have been applying the [facility] have been putting gauze underneath the tubing, but nonetheless there is ongoing tissue breakdown at this site. Light slough accumulation on the wound surface. The tunnel continues to contract. He  is tolerating HBO without difficulty. 01/07/2022: Bridging the wound VAC away from the ankle has resulted in significant improvement in the tissue at the apex of the wound. The tunnel is still present and is not all that much shorter, but the overall wound surface is very robust and healthy looking. Minimal slough accumulation. No concern for acute infection. 01/21/2022: The wound continues to contract and has [name] robust granulation tissue surface. The tunnel has  come in considerably and is down to about 1.4 cm. There is still bone exposed within the tunnel but the rest of it is well covered. There is some senescent epithelium at the wound margins and [name] little bit of slough on the surface. 01/28/2022: No significant change in the wound this week, but there has not been any reaccumulation of senescent epithelium or slough. The tunnel is perhaps [name] millimeter less in depth. He has been approved for Apligraf and we will apply this today. 02/04/2022: The wound has contracted somewhat and the tunnel has filled in completely. The wound surface is clean. He is here for Apligraf #2. 02/11/2022: The wound has contracted further and is now nearly flush with the surrounding skin surface. Light layer of slough. Apligraf #3 plan for today. 02/18/2022: There is [name] band of epithelium trying to cut across the superior portion of the wound. There is robust granulation tissue with just [name] light layer of slough and biofilm on the surface. There was [name] little bit of greenish drainage in the wound VAC but none appreciated on the site itself. He is here for Apligraf #4. 03/04/2022: The wound has contracted considerably. There is good granulation tissue on the surface. Minimal biofilm. He is here for Apligraf #5. 03/18/2022: The wound has epithelialized to the point that it has been divided into 2 areas. Both areas are smaller in total than at his previous visit. There is good granulation tissue present with just [name] little bit of periwound eschar and surface slough. 10/13; left medial foot. The patient has completed treatment with hyperbaric oxygen for underlying osteomyelitis he has had [name] nice response in the wound. Noted today that he still had some greenish drainage. Previously he has been treated with topical gentamicin but apparently that was stopped 2 weeks ago. Fortunately the wound is measuring smaller 10/20; wound looks better no hypergranulation minimal drainage. Granulation tissue  looks healthy using gentamicin Hydrofera Blue under compression 04/08/2022: The wound continues to contract tremendously. The response to his treatment for hypertrophic granulation tissue has been quite remarkable. There are just [name] couple of open areas remaining. 04/18/2022: His wound is healed. READMISSION 07/04/2022: He returns to clinic with [name] new ulcer adjacent to where his previous large wound had been. He says that he was wearing boots and using [name] small padded dressing to protect the freshly healed ankle wound. Apparently the dressing rolled up and rubbed on his skin, causing [name] new wound. He contacted our office last week concerned about the appearance. There is [name] small oval wound on his left medial ankle. It is fairly clean with some periwound dry skin and eschar. 07/11/2022: The wound is about the same size this week. There is [name] layer of rubbery slough on the surface. It has [name] fairly strong odor. 07/18/2022: The wound measured larger this week due to small satellite areas opening in [name] fan pattern distal to the primary wound. He continues to accumulate slough on the wound surface. The culture that I took last week only grew out low levels of skin  flora. 07/25/2022: The satellite areas that had opened last week have closed. The main wound is smaller with some slough accumulation. 08/01/2022: The wound continues to contract. There is [name] layer of slough on the surface. 08/08/2022: The wound is [name] little bit smaller again this week. Still with slough buildup. 3/18; patient presents for follow-up. He has been doing dressing changes on his own with silver alginate and antibiotic ointment. He has been at [employer] over the past 3 weeks. He has developed [name] new wound. 09/05/2022: I am not sure if the new wound that was reported last week is just the satellite area that had closed [name] couple of weeks ago and reopened. Regardless, both wounds are quite small with good epithelialization. There is some eschar  buildup around the periphery of both wounds with slough on both wound surfaces. 09/12/2022: The wounds have converged. There is some increased in depth at the distal portion. This seems to be secondary to moisture accumulation as the periwound skin is [name] bit macerated. 09/26/2022: The wound has deteriorated further. There is increased depth at the distal portion and the wound probes to bone. The patient theorizes that the boot he has been wearing is rubbing in this spot and upon inspection of the boot, there is actually [name] hard plastic boot in exactly this location, supporting his theory. 10/05/2022: The wound looks [name] little bit better this week. The area that probed to bone now has some tissue coverage. There is still some slough buildup on the wound surfaces. 10/10/2022: The wound is strongly malodorous today. There is no purulent drainage, nor any increased erythema. There is some tissue maceration around the wound, but he did not have [name] whole lot of drainage on his dressings. There is slough on the wound surface. 10/17/2022: The wound still has [name] fairly strong odor and the drainage has increased. It is goopy and purulent. The culture that I took last week returned late on Thursday with methicillin and tetracycline resistant staph epidermidis and multiple other staph species at fairly high concentrations. 10/27/2022: The wound has cleaned up considerably. There is no odor and less drainage. The drainage is no longer purulent. He is taking Bactrim as prescribed. Colin [name], Colin [name] ([id number]) [id number] Page 9 of 13 11/03/2022: He completed his course of Bactrim however the wound has deteriorated, has [name] foul odor, and there are fragments of bone breaking off in his wound. His drainage was dark and purulent. 11/08/2022: As expected, the bone biopsy returned with osteomyelitis and osteonecrosis. His culture returned with [name] number of species with the Levaquin I had already prescribed  being along with the first-line choice as suggested by the pharmacist. Notably, however, Pseudomonas aeruginosa was not among the species cultivated. He has an appointment to see Dr. Daiva Eves on Wednesday the 19th; he has vascular follow-up on Thursday the sixth. The wound actually looks [name] little bit better today. There are no chunks of bone falling out, nor am I actually able to probe or palpate bone. He did have [name] bit more drainage, but this was noted as murky or purulent as at his previous visit. There is slough buildup on the wound surface and the granulation tissue present is [name] little bit hypertrophic and friable. 11/15/2022: The wound actually looks [name] little bit better today. I can still palpate bone with his foot in [name] relaxed, plantarflexed position, but there does seem to be some tissue covering it. The drainage has improved. He has completed the oral levofloxacin that I  prescribed. He is going to see vascular surgery on Thursday and has an appointment with infectious disease on Wednesday the 19th. 11/21/2022: There is just [name] tiny little spike of bone that is still palpable. Everything else looks improved. He is having an angiogram next Friday to see if he can be optimized from an arterial inflow perspective. 11/28/2022: The bone is completely covered with soft tissue. The wound is smaller. 12/05/2022: He had his angiogram last week and 3 vessels were in his lower leg was stented. The ankle wound is smaller again today with minimal slough accumulation. He managed to tear his toenail off of his fourth toe today when he was removing his socks. This has left [name] wound with the nailbed/fat layer exposed. Patient History Information obtained from Patient. Family History Cancer - Father, Diabetes - Father,Mother,Paternal Grandparents, Heart Disease - Father, Hypertension - Father, No family history of Hereditary Spherocytosis, Kidney Disease, Lung Disease, Seizures, Stroke, Thyroid Problems,  Tuberculosis. Social History Never smoker, Marital Status - Married, Alcohol Use - Rarely, Drug Use - No History, Caffeine Use - Daily. Medical History Eyes Patient has history of Cataracts - Removed 2008 Cardiovascular Patient has history of Coronary Artery Disease, Hypertension, Myocardial Infarction, Peripheral Arterial Disease Endocrine Patient has history of Type II Diabetes Musculoskeletal Patient has history of Osteomyelitis Neurologic Patient has history of Neuropathy Medical [name] Surgical History Notes nd Cardiovascular Hypercholesterolemia Abnormal EKG CABG X3 2019 Gastrointestinal GERD Musculoskeletal Diabetic foot ulcer Objective Constitutional Hypertensive, asymptomatic. Tachycardic, asymptomatic. no acute distress. Vitals Time Taken: 2:44 AM, Height: 74 in, Weight: 187 lbs, BMI: 24, Temperature: 97.8 F, Pulse: 124 bpm, Respiratory Rate: 20 breaths/min, Blood Pressure: 155/92 mmHg, Capillary Blood Glucose: 101 mg/dl. Respiratory Normal work of breathing on room air. General Notes: 12/05/2022: The ankle wound is smaller again today with minimal slough accumulation. He managed to tear his toenail off of his fourth toe today when he was removing his socks. This has left [name] wound with the nailbed/fat layer exposed. Integumentary (Hair, Skin) Wound #2 status is Open. Original cause of wound was Pressure Injury. The date acquired was: 06/13/2022. The wound has been in treatment 22 weeks. The wound is located on the Left,Medial Ankle. The wound measures 0.7cm length x 1.5cm width x 0.3cm depth; 0.825cm^2 area and 0.247cm^3 volume. There is Fat Layer (Subcutaneous Tissue) exposed. There is no tunneling or undermining noted. There is [name] medium amount of serosanguineous drainage noted. The wound margin is distinct with the outline attached to the wound base. There is large (67-100%) red granulation within the wound bed. There is [name] small (1-33%) amount of necrotic tissue within the  wound bed including Adherent Slough. The periwound skin appearance had no abnormalities noted for texture. The periwound skin appearance had no abnormalities noted for color. The periwound skin appearance exhibited: Maceration. Periwound temperature was noted as No Abnormality. Wound #4 status is Open. Original cause of wound was Skin Tear/Laceration. The date acquired was: 12/05/2022. The wound is located on the Left T Fourth. Colin [name], Colin [name] [id number] Page 10 of 13 The wound measures 1.2cm length x 1cm width x 0.1cm depth; 0.942cm^2 area and 0.094cm^3 volume. There is Fat Layer (Subcutaneous Tissue) exposed. There is no tunneling or undermining noted. There is [name] medium amount of serosanguineous drainage noted. The wound margin is distinct with the outline attached to the wound base. There is large (67-100%) red granulation within the wound bed. There is no necrotic tissue within the wound bed. The periwound skin appearance  had no abnormalities noted for texture. The periwound skin appearance had no abnormalities noted for moisture. The periwound skin appearance had no abnormalities noted for color. Periwound temperature was noted as No Abnormality. 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 There was [name] Selective/Open Wound Non-Viable Tissue Debridement with [name] total area of 0.82 sq cm performed by Colin Guess, MD. With the following instrument(s): Curette to remove Non-Viable tissue/material. Material removed includes [name] after  achieving pain control using Lidocaine 4% T opical Solution. No specimens were taken. [name] time out was conducted at 15:24, prior to the start of the procedure. [name] Minimum amount of bleeding was controlled with Pressure. The procedure was tolerated well. Post Debridement Measurements: 0.7cm length x 1.5cm width x 0.3cm depth; 0.247cm^3 volume. Character of Wound/Ulcer Post Debridement is improved. Severity of Tissue Post Debridement is: Fat layer exposed. Post procedure Diagnosis Wound #2: Same as Pre-Procedure General Notes: Scribed for Dr. Lady [name] by Colin Bruin, RN. Pre-procedure diagnosis of Wound #2 is [name] Diabetic Wound/Ulcer of the Lower Extremity located on the Left,Medial Ankle . There was [name] Double Layer Compression Therapy Procedure by Burt Ek, RN. Post procedure Diagnosis Wound #2: Same as Pre-Procedure Plan Follow-up Appointments: Return Appointment in 1 week. - Dr. Lady [name] - room 2 Bathing/ Shower/ Hygiene: May shower with protection but do not get wound dressing(s) wet. Protect dressing(s) with water repellant cover (for example, large plastic bag) or [name] cast cover and may then take shower. Edema Control - Lymphedema / SCD / Other: Elevate legs to the level of the heart or above for 30 minutes daily and/or when sitting for 3-4 times [name] day throughout the day. Avoid standing for long periods of time. Off-Loading: Open toe surgical shoe to: - left foot Other: - knee scooter to left leg, stop wearing your orthoboot WOUND #2: - Ankle Wound Laterality: Left, Medial Cleanser: Normal Saline 1 x Per Week/30 Days Discharge Instructions: Cleanse the wound with Normal Saline prior to applying [name] clean dressing using gauze sponges, not tissue or cotton balls. Peri-Wound Care: Zinc Oxide Ointment 30g tube 1 x Per Week/30 Days Discharge Instructions: Apply Zinc Oxide to periwound with each dressing change Peri-Wound Care: Sween Lotion (Moisturizing lotion) 1 x Per Week/30  Days Discharge Instructions: Apply moisturizing lotion as directed Topical: Gentamicin 1 x Per Week/30 Days Discharge Instructions: As directed by physician Topical: Mupirocin Ointment 1 x Per Week/30 Days Discharge Instructions: Apply Mupirocin (Bactroban) as instructed Prim Dressing: Hydrofera Blue Ready Transfer Foam, 2.5x2.5 (in/in) 1 x Per Week/30 Days ary Discharge Instructions: Apply directly to wound bed as directed Secondary Dressing: Woven Gauze Sponge, Non-Sterile 4x4 in 1 x Per Week/30 Days Discharge Instructions: Apply over primary dressing as directed. Secondary Dressing: Zetuvit Plus 4x8 in (Generic) 1 x Per Week/30 Days Discharge Instructions: Apply over primary dressing as directed. Secured With: Transpore Surgical T ape, 2x10 (in/yd) 1 x Per Week/30 Days Discharge Instructions: Secure dressing with tape as directed. Com pression Wrap: Urgo K2 Lite, (equivalent to [name] 3 layer) two layer compression system, regular 1 x Per Week/30 Days Discharge Instructions: Apply Urgo K2 Lite as directed (alternative to 3  layer compression). WOUND #4: - T Fourth Wound Laterality: Left oe Cleanser: Soap and Water 1 x Per Day/30 Days Discharge Instructions: May shower and wash wound with dial antibacterial soap and water prior to dressing change. Cleanser: Wound Cleanser 1 x Per Day/30 Days Discharge Instructions: Cleanse the wound with wound cleanser prior to applying [name] clean dressing using gauze sponges, not tissue or cotton balls. Prim Dressing: Maxorb Extra Ag+ Alginate Dressing, 2x2 (in/in) 1 x Per Day/30 Days ary Discharge Instructions: Apply to wound bed as instructed Secondary Dressing: Woven Gauze Sponges 2x2 in 1 x Per Day/30 Days Discharge Instructions: Apply over primary dressing as directed. Colin [name], Colin [name] ([id number]) [id number] Page 11 of 13 Secured With: Web designer, Sterile 4x75 (in/in) 1 x Per Day/30 Days Discharge  Instructions: Secure with stretch gauze as directed. 12/05/2022: He had his angiogram last week and 3 vessels in his lower leg were stented. The ankle wound is smaller again today with minimal slough accumulation. He managed to tear his toenail off of his fourth toe today when he was removing his socks. This has left [name] wound with the nailbed/fat layer exposed. I used [name] curette to debride slough from the ankle wound. The new wound on his left toe did not require debridement. I am going to apply silver alginate here. We will continue the mixture of topical gentamicin and mupirocin with Hydrofera Blue to the ankle wound. Continue 3 layer compression. Hopefully the arterial stenting will speed up the healing process. Follow-up in 1 week. Electronic Signature(s) Signed: 12/05/2022 3:55:22 PM By: Colin Guess MD FACS Entered By: Colin [name] on 12/05/2022 15:55:22 -------------------------------------------------------------------------------- HxROS Details Patient Name: Date of Service: Colin RRO [name] [name]. 12/05/2022 2:45 PM Medical Record Number: [id number] Patient Account Number: [id number] Date of Birth/Sex: Treating RN: [birth year] (67 y.o. M) Primary Care Provider: [name] Other Clinician: Referring Provider: Treating Provider/Extender: Colin [name] Weeks in Treatment: 22 Information Obtained From Patient Eyes Medical History: Positive for: Cataracts - Removed 2008 Cardiovascular Medical History: Positive for: Coronary Artery Disease; Hypertension; Myocardial Infarction; Peripheral Arterial Disease Past Medical History Notes: Hypercholesterolemia Abnormal EKG CABG X3 2019 Gastrointestinal Medical History: Past Medical History Notes: GERD Endocrine Medical History: Positive for: Type II Diabetes Time with diabetes: 24 years Treated with: Insulin, Oral agents Blood sugar tested every day: Yes Tested : Musculoskeletal Medical History: Positive for:  Osteomyelitis Past Medical History Notes: Diabetic foot ulcer Neurologic Medical History: Positive for: Neuropathy JAMAAL, BERNASCONI [name] ([id number]) [phone].pdf Page 12 of 13 HBO Extended History Items Eyes: Cataracts Immunizations Pneumococcal Vaccine: Received Pneumococcal Vaccination: Yes Received Pneumococcal Vaccination On or After 60th Birthday: Yes Implantable Devices Yes Family and Social History Cancer: Yes - Father; Diabetes: Yes - Father,Mother,Paternal Grandparents; Heart Disease: Yes - Father; Hereditary Spherocytosis: No; Hypertension: Yes - Father; Kidney Disease: No; Lung Disease: No; Seizures: No; Stroke: No; Thyroid Problems: No; Tuberculosis: No; Never smoker; Marital Status - Married; Alcohol Use: Rarely; Drug Use: No History; Caffeine Use: Daily; Financial Concerns: No; Food, Clothing or Shelter Needs: No; Support System Lacking: No; Transportation Concerns: No Electronic Signature(s) Signed: 12/05/2022 4:31:09 PM By: Colin Guess MD FACS Entered By: Colin [name] on 12/05/2022 15:52:54 -------------------------------------------------------------------------------- SuperBill Details Patient Name: Date of Service: Colin RRO [name] [name]. 12/05/2022 Medical Record Number: [id number] Patient Account Number: [id number] Date of Birth/Sex: Treating RN: [birth year] (68 y.o. 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 Modifier: Quantity: 1 Physician Procedures : CPT4 Code Description Modifier [id number] - WC PHYS LEVEL 4 - EST PT 25 ICD-10 Diagnosis Description 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

## 2022-12-06 LAB — POCT ACTIVATED CLOTTING TIME
Activated Clotting Time: 122 seconds
Activated Clotting Time: 220 seconds

## 2022-12-07 ENCOUNTER — Telehealth: Payer: Self-pay | Admitting: Vascular Surgery

## 2022-12-07 NOTE — Telephone Encounter (Signed)
-----   Message from [name], MD sent at 12/02/2022  8:54 AM EDT ----- [name] 12/02/2022 Procedure: 1) Ultrasound guided right common femoral artery access 2) Aortogram 3) Left lower extremity angiogram with second order cannulation (70mL total contrast) 4) Additional left lower extremity angiogram with third order cannulation 5) Left popliteal angioplasty (4x64mm Sterling) 6) Left tibioperoneal trunk angioplasty (4x87mm Sterling) 7) Left posterior tibial angioplasty (3x117mm Sterling) 8) Conscious sedation (54 minutes)    Assistant: none Follow up: 4 weeks with me  Studies for follow up: ABI / LLE duplex  Thank you! Elijah Birk

## 2022-12-12 ENCOUNTER — Encounter (HOSPITAL_BASED_OUTPATIENT_CLINIC_OR_DEPARTMENT_OTHER): Payer: BC Managed Care – PPO | Attending: General Surgery | Admitting: General Surgery

## 2022-12-12 DIAGNOSIS — Z7984 Long term (current) use of oral hypoglycemic drugs: Secondary | ICD-10-CM | POA: Insufficient documentation

## 2022-12-12 DIAGNOSIS — Z833 Family history of diabetes mellitus: Secondary | ICD-10-CM | POA: Diagnosis not present

## 2022-12-12 DIAGNOSIS — Z794 Long term (current) use of insulin: Secondary | ICD-10-CM | POA: Diagnosis not present

## 2022-12-12 DIAGNOSIS — E1151 Type 2 diabetes mellitus with diabetic peripheral angiopathy without gangrene: Secondary | ICD-10-CM | POA: Insufficient documentation

## 2022-12-12 DIAGNOSIS — L97322 Non-pressure chronic ulcer of left ankle with fat layer exposed: Secondary | ICD-10-CM | POA: Diagnosis not present

## 2022-12-12 DIAGNOSIS — I2510 Atherosclerotic heart disease of native coronary artery without angina pectoris: Secondary | ICD-10-CM | POA: Insufficient documentation

## 2022-12-12 DIAGNOSIS — Z951 Presence of aortocoronary bypass graft: Secondary | ICD-10-CM | POA: Insufficient documentation

## 2022-12-12 DIAGNOSIS — L97522 Non-pressure chronic ulcer of other part of left foot with fat layer exposed: Secondary | ICD-10-CM | POA: Insufficient documentation

## 2022-12-12 DIAGNOSIS — Z8249 Family history of ischemic heart disease and other diseases of the circulatory system: Secondary | ICD-10-CM | POA: Insufficient documentation

## 2022-12-12 DIAGNOSIS — E11621 Type 2 diabetes mellitus with foot ulcer: Secondary | ICD-10-CM | POA: Insufficient documentation

## 2022-12-12 DIAGNOSIS — E11622 Type 2 diabetes mellitus with other skin ulcer: Secondary | ICD-10-CM | POA: Diagnosis not present

## 2022-12-12 NOTE — Progress Notes (Signed)
Colin, SHAUB [name] [id number] Page 1 of 14 Visit Report for 12/12/2022 Chief Complaint Document Details Patient Name: Date of Service: NA [name] 12/12/2022 2:15 PM Medical Record Number: [id number] Patient Account Number: [id number] Date of Birth/Sex: Treating RN: August 05, 1955 (67 y.o. M) Primary Care Provider: [name] Other Clinician: Referring Provider: Treating Provider/Extender: [name] in Treatment: 23 Information Obtained from: Patient Chief Complaint Patients presents for treatment of an open diabetic ulcer with exposed bone and osteomyelitis 07/04/2022: Diabetic ankle ulcer Electronic Signature(s) Signed: 12/12/2022 2:47:58 PM By: Colin Guess MD FACS Entered By: [name] on 12/12/2022 14:47:57 -------------------------------------------------------------------------------- Debridement Details Patient Name: Date of Service: NA [name]. 12/12/2022 2:15 PM Medical Record Number: [id number] Patient Account Number: [id number] Date of Birth/Sex: Treating RN: 01/10/1956 (67 y.o. [name] Primary Care Provider: [name] Other Clinician: Referring Provider: Treating Provider/Extender: [name] Weeks in Treatment: 23 Debridement Performed for Assessment: Wound #2 Left,Medial Ankle Performed By: Physician Colin Guess, MD Debridement Type: Debridement Severity of Tissue Pre Debridement: Fat layer exposed Level of Consciousness (Pre-procedure): Awake and Alert Pre-procedure Verification/Time Out Yes - 14:30 Taken: Start Time: 14:30 Pain Control: Lidocaine 4% T opical Solution Percent of Wound Bed Debrided: 100% T Area Debrided (cm): otal 0.82 Tissue and other material debrided: Non-Viable, Slough, Slough Level: Non-Viable Tissue Debridement Description: Selective/Open Wound Instrument: Curette Bleeding: Minimum Hemostasis Achieved: Pressure Response  to Treatment: Procedure was tolerated well Level of Consciousness (Post- Awake and Alert procedure): Post Debridement Measurements of Total Wound Length: (cm) 0.7 Width: (cm) 1.5 Depth: (cm) 0.5 Volume: (cm) 0.412 Character of Wound/Ulcer Post Debridement: Improved Severity of Tissue Post Debridement: Fat layer exposed Colin, [name] ([id number]) [phone].pdf Page 2 of 14 Post Procedure Diagnosis Same as Pre-procedure Notes scribed for Dr. Lady Gary by Colin Bruin, RN Electronic Signature(s) Signed: 12/12/2022 2:57:10 PM By: Colin Guess MD FACS Signed: 12/12/2022 3:44:30 PM By: [name] Entered By: [name] on 12/12/2022 14:30:56 -------------------------------------------------------------------------------- Debridement Details Patient Name: Date of Service: NA [name]. 12/12/2022 2:15 PM Medical Record Number: [id number] Patient Account Number: [id number] Date of Birth/Sex: Treating RN: [birth year] (67 y.o. [name] Primary Care Provider: [name] Other Clinician: Referring Provider: Treating Provider/Extender: [name] in Treatment: 23 Debridement Performed for Assessment: Wound #4 Left T Fourth oe Performed By: Physician Colin Guess, MD Debridement Type: Debridement Severity of Tissue Pre Debridement: Fat layer exposed Level of Consciousness (Pre-procedure): Awake and Alert Pre-procedure Verification/Time Out Yes - 14:30 Taken: Start Time: 14:30 Pain Control: Lidocaine 4% Topical Solution Percent of Wound Bed Debrided: 100% T Area Debrided (cm): otal 0.09 Tissue and other material debrided: Non-Viable, Eschar, Slough, Slough Level: Non-Viable Tissue Debridement Description: Selective/Open Wound Instrument: Curette Bleeding: Minimum Hemostasis Achieved: Pressure Response to Treatment: Procedure was tolerated well Level of Consciousness (Post- Awake and  Alert procedure): Post Debridement Measurements of Total Wound Length: (cm) 0.3 Width: (cm) 0.4 Depth: (cm) 0.1 Volume: (cm) 0.009 Character of Wound/Ulcer Post Debridement: Improved Severity of Tissue Post Debridement: Fat layer exposed Post Procedure Diagnosis Same as Pre-procedure Notes scribed for Dr. Lady Gary by Colin Bruin, RN Electronic Signature(s) Signed: 12/12/2022 2:57:10 PM By: Colin Guess MD FACS Signed: 12/12/2022 3:44:30 PM By: [name] Entered By: [name] on 12/12/2022 14:31:20 [name] ([id number]) [id number] Page 3 of 14 -------------------------------------------------------------------------------- HPI Details Patient Name: Date of Service: NA [name], [name]. 12/12/2022 2:15 PM Medical Record  Number: [id number] Patient Account Number: [id number] Date of Birth/Sex: Treating RN: 1955/07/12 (67 y.o. M) Primary Care Provider: [name] Other Clinician: Referring Provider: Treating Provider/Extender: [name] in Treatment: 23 History of Present Illness HPI Description: ADMISSION 08/25/2021 This is [name] 67 year old man who initially presented to his primary care provider in September 2022 with pain in his left foot. He was sent for an x-ray and while the x-ray was being performed, the tech pointed out [name] wound on his foot that the patient was not aware existed. He does have type 2 diabetes with significant neuropathy. His diabetes is suboptimally controlled with his most recent A1c being 8.5. He also has [name] history of coronary artery disease status post three- vessel CABG. he was initially seen by orthopedics, but they referred him to [facility]. He has undergone at least 7 operations/debridements and several applications of skin substitute under the care of podiatry. He has been in [name] for much of this time. His most recent procedure was July 28, 2021. [name]  portion of the talus was biopsied and was found to be consistent with osteomyelitis. Culture also returned positive for corynebacterium. He was seen on August 16, 2021 by infectious disease. [name] PICC line has been placed and he will be receiving [name] 6-week course of IV daptomycin and cefepime. In October 2022, he underwent lower extremity vascular studies. Results are copied here: Right: Resting right ankle-brachial index is within normal range. No evidence of significant right lower extremity arterial disease. The right toe-brachial index is abnormal. Left: Resting left ankle-brachial index indicates mild left lower extremity arterial disease. The left toe-brachial index is abnormal. He has not been seen by vascular surgery despite these findings. He presented to clinic today in [name] and is using [name] to offload. Wound VAC was in place. Once this was removed, [name] large ulcer was identified on the left midfoot/ankle. Bone is frankly exposed. There is no malodorous or purulent drainage. There is some granulation tissue over the central portion of the exposed bone. There is [name] tunnel that extends posteriorly for roughly 10 cm. It has been discussed with him by multiple providers that he is at very high risk of losing his lower leg because of this wound. He is extremely eager to avoid this outcome and is here today to review his options as well as receive ongoing wound care. 09/03/2021: Here for reevaluation of his wound. There does not appear to have been any substantial improvement overall since our last visit. He has been in [name] with white foam overlying the exposed bone. We are working on getting him approved for hyperbaric oxygen therapy. 09/10/2021: We are in the process of getting him cleared to begin hyperbaric oxygen therapy. He still needs to obtain [name] chest x-ray. Although the wound measurements are roughly the same, I think the overall appearance of the wound is better. The  exposed bone has [name] bit more granulation tissue covering it. He has not received [name] vascular surgery appointment to reevaluate his flow to the wound. 09/17/2021: He has been approved for hyperbaric oxygen therapy and completed his chest x-ray, which I reviewed and it appears normal. The tunnels at the 12 and 10:00 positions are smaller. There is more granulation tissue covering the exposed bone and the undermining has decreased. He still has not received [name] vascular surgery appointment. 09/24/2021: He initiated hyperbaric oxygen therapy this week and is tolerating it well. He  has an appointment with vascular surgery coming up on May 16. The granulation tissue is covering more of the exposed bone and both tunnels are [name] bit smaller. 10/01/2021: He continues to tolerate hyperbaric oxygen therapy. He saw infectious disease and they are planning to pull his PICC line. He has been initiated on oral antibiotics (doxycycline and Augmentin). The wound looks about the same but the tunnels are [name] little bit smaller. The skin seems to be contracting somewhat around the exposed bone. 10/08/2021: The wound is still about the same size, but the tunnels continue to come in and the skin is contracting around the exposed bone. He continues to have some accumulation of necrotic material in the inferoposterior aspect of the wound as well as accumulation at the 12:00 tunnel area. 10/15/2021: The wound is smaller today. The tunnels continue to come in. There is less necrotic tissue present. He does have some periwound maceration. 10/22/2021: The wound is about the same size. There is [name] little bit less undermining at the distal portion. The exposed bone is dark and I am not sure if this is staining from silver nitrate or his VAC sponge or if it represents necrosis. The tunnels are shallower but he does have some serous drainage coming from the 10:00 tunnel. He continues to tolerate hyperbaric oxygen therapy well. 10/29/2021: The  undermining continues to improve. The tunnels are about the same. He has good granulation tissue overlying the majority of the exposed bone. It does appear that perhaps the tubing from his wound VAC has been eroding the skin at the 12 clock position. He continues to accumulate senescent epithelium around the borders of the wound. 11/05/2021: The undermining is almost completely resolved. The tunnels have contracted fairly significantly. No significant slough or debris accumulation. There is still senescent epithelium accumulation around the borders of the wound. He has been tolerating hyperbaric oxygen therapy well. 11/12/2021: Despite the measurements of the wound being about the same, the wound has changed in its shape and overall, I think it is improved. The undermining has resolved and the tunnels continue to shorten. There is good granulation tissue encroaching over the small area of bone that has remained exposed at the 12 o'clock position. Minimal slough accumulation. He continues to tolerate hyperbaric oxygen therapy well. 11/19/2021: I took [name] PCR culture last week. There was overgrowth of yeast. He is already taking suppressive doxycycline and Augmentin. I added fluconazole to his regimen. The wound is smaller again today. The tunnels continue to shorten. He continues to do well with hyperbaric oxygen therapy. 11/26/2021: For some reason, his foot has become macerated. The wound is narrower but about the same dimensions in its longitudinal aspect. The tunnels continue to shorten. He has some slough buildup on the wound as well as some heaped up senescent epithelium around the perimeter. [name], [name] ([id number]) [id number] Page 4 of 14 12/03/2021: No further maceration of his foot has occurred. The wound has contracted quite significantly from last week. The tunnel at 10:00 is closed. The tunnel at 12:00 is down to just [name] couple of millimeters. No other undermining is  present. There is soft tissue coverage of the previously exposed bone. There is just [name] bit of slough and biofilm on the wound surface. 12/10/2021: The wound is looking good. It turns out the tunnel at 12:00 is only exposed when the patient dorsiflexes his foot. It is about 2 cm in depth when he does this; when his foot is in plantarflexion, the tunnel is  closed. The bone that was visible at the 12:00 tunnel is completely covered with granulation tissue, but there does feel like some exposed bone deeper into the tunnel area. There is senescent skin heaped up around the periphery. Minimal slough on the wound surface. 12/16/2021: The wound dimensions are roughly the same. The surface has nice granulation tissue. The exposed bone at the 12:00 tunnel continues to be covered with more soft tissue. 7/14; patient's wound measures smaller today. Using the wound VAC with underlying collagen. He is also being treated with HBO for underlying osteomyelitis. He tells me he is on doxycycline and ampicillin follows with infectious disease next week 12/31/2021: The wound continues to contract. Unfortunately, the area where the track pad and tubing have been rubbing continues to look like it is applying friction. He says that the home health nurses that have been applying the [facility] have been putting gauze underneath the tubing, but nonetheless there is ongoing tissue breakdown at this site. Light slough accumulation on the wound surface. The tunnel continues to contract. He is tolerating HBO without difficulty. 01/07/2022: Bridging the wound VAC away from the ankle has resulted in significant improvement in the tissue at the apex of the wound. The tunnel is still present and is not all that much shorter, but the overall wound surface is very robust and healthy looking. Minimal slough accumulation. No concern for acute infection. 01/21/2022: The wound continues to contract and has [name] robust granulation tissue surface. The  tunnel has come in considerably and is down to about 1.4 cm. There is still bone exposed within the tunnel but the rest of it is well covered. There is some senescent epithelium at the wound margins and [name] little bit of slough on the surface. 01/28/2022: No significant change in the wound this week, but there has not been any reaccumulation of senescent epithelium or slough. The tunnel is perhaps [name] millimeter less in depth. He has been approved for Apligraf and we will apply this today. 02/04/2022: The wound has contracted somewhat and the tunnel has filled in completely. The wound surface is clean. He is here for Apligraf #2. 02/11/2022: The wound has contracted further and is now nearly flush with the surrounding skin surface. Light layer of slough. Apligraf #3 plan for today. 02/18/2022: There is [name] band of epithelium trying to cut across the superior portion of the wound. There is robust granulation tissue with just [name] light layer of slough and biofilm on the surface. There was [name] little bit of greenish drainage in the wound VAC but none appreciated on the site itself. He is here for Apligraf #4. 03/04/2022: The wound has contracted considerably. There is good granulation tissue on the surface. Minimal biofilm. He is here for Apligraf #5. 03/18/2022: The wound has epithelialized to the point that it has been divided into 2 areas. Both areas are smaller in total than at his previous visit. There is good granulation tissue present with just [name] little bit of periwound eschar and surface slough. 10/13; left medial foot. The patient has completed treatment with hyperbaric oxygen for underlying osteomyelitis he has had [name] nice response in the wound. Noted today that he still had some greenish drainage. Previously he has been treated with topical gentamicin but apparently that was stopped 2 weeks ago. Fortunately the wound is measuring smaller 10/20; wound looks better no hypergranulation minimal drainage. Granulation  tissue looks healthy using gentamicin Hydrofera Blue under compression 04/08/2022: The wound continues to contract tremendously. The response to  his treatment for hypertrophic granulation tissue has been quite remarkable. There are just [name] couple of open areas remaining. 04/18/2022: His wound is healed. READMISSION 07/04/2022: He returns to clinic with [name] new ulcer adjacent to where his previous large wound had been. He says that he was wearing boots and using [name] small padded dressing to protect the freshly healed ankle wound. Apparently the dressing rolled up and rubbed on his skin, causing [name] new wound. He contacted our office last week concerned about the appearance. There is [name] small oval wound on his left medial ankle. It is fairly clean with some periwound dry skin and eschar. 07/11/2022: The wound is about the same size this week. There is [name] layer of rubbery slough on the surface. It has [name] fairly strong odor. 07/18/2022: The wound measured larger this week due to small satellite areas opening in [name] fan pattern distal to the primary wound. He continues to accumulate slough on the wound surface. The culture that I took last week only grew out low levels of skin flora. 07/25/2022: The satellite areas that had opened last week have closed. The main wound is smaller with some slough accumulation. 08/01/2022: The wound continues to contract. There is [name] layer of slough on the surface. 08/08/2022: The wound is [name] little bit smaller again this week. Still with slough buildup. 3/18; patient presents for follow-up. He has been doing dressing changes on his own with silver alginate and antibiotic ointment. He has been at [employer] over the past 3 weeks. He has developed [name] new wound. 09/05/2022: I am not sure if the new wound that was reported last week is just the satellite area that had closed [name] couple of weeks ago and reopened. Regardless, both wounds are quite small with good epithelialization. There is some  eschar buildup around the periphery of both wounds with slough on both wound surfaces. 09/12/2022: The wounds have converged. There is some increased in depth at the distal portion. This seems to be secondary to moisture accumulation as the periwound skin is [name] bit macerated. 09/26/2022: The wound has deteriorated further. There is increased depth at the distal portion and the wound probes to bone. The patient theorizes that the boot he has been wearing is rubbing in this spot and upon inspection of the boot, there is actually [name] hard plastic boot in exactly this location, supporting his theory. 10/05/2022: The wound looks [name] little bit better this week. The area that probed to bone now has some tissue coverage. There is still some slough buildup on the wound surfaces. 10/10/2022: The wound is strongly malodorous today. There is no purulent drainage, nor any increased erythema. There is some tissue maceration around the wound, but he did not have [name] whole lot of drainage on his dressings. There is slough on the wound surface. 10/17/2022: The wound still has [name] fairly strong odor and the drainage has increased. It is goopy and purulent. The culture that I took last week returned late on Thursday with methicillin and tetracycline resistant staph epidermidis and multiple other staph species at fairly high concentrations. [name], [name] [id number] Page 5 of 14 10/27/2022: The wound has cleaned up considerably. There is no odor and less drainage. The drainage is no longer purulent. He is taking Bactrim as prescribed. 11/03/2022: He completed his course of Bactrim however the wound has deteriorated, has [name] foul odor, and there are fragments of bone breaking off in his wound. His drainage was dark and purulent. 11/08/2022:  As expected, the bone biopsy returned with osteomyelitis and osteonecrosis. His culture returned with [name] number of species with the Levaquin I had already  prescribed being along with the first-line choice as suggested by the pharmacist. Notably, however, Pseudomonas aeruginosa was not among the species cultivated. He has an appointment to see Dr. Daiva Eves on Wednesday the 19th; he has vascular follow-up on Thursday the sixth. The wound actually looks [name] little bit better today. There are no chunks of bone falling out, nor am I actually able to probe or palpate bone. He did have [name] bit more drainage, but this was noted as murky or purulent as at his previous visit. There is slough buildup on the wound surface and the granulation tissue present is [name] little bit hypertrophic and friable. 11/15/2022: The wound actually looks [name] little bit better today. I can still palpate bone with his foot in [name] relaxed, plantarflexed position, but there does seem to be some tissue covering it. The drainage has improved. He has completed the oral levofloxacin that I prescribed. He is going to see vascular surgery on Thursday and has an appointment with infectious disease on Wednesday the 19th. 11/21/2022: There is just [name] tiny little spike of bone that is still palpable. Everything else looks improved. He is having an angiogram next Friday to see if he can be optimized from an arterial inflow perspective. 11/28/2022: The bone is completely covered with soft tissue. The wound is smaller. 12/05/2022: He had his angiogram last week and 3 vessels in his lower leg was stented. The ankle wound is smaller again today with minimal slough accumulation. He managed to tear his toenail off of his fourth toe today when he was removing his socks. This has left [name] wound with the nailbed/fat layer exposed. 12/12/2022: The wound was measured the same size with [name] little bit more depth to it today, but visual inspection suggest that it is smaller. There is hypertrophic granulation tissue present. The site on his fourth toe is smaller with some eschar and slough accumulation. Electronic  Signature(s) Signed: 12/12/2022 2:48:53 PM By: Colin Guess MD FACS Entered By: [name] on 12/12/2022 14:48:53 -------------------------------------------------------------------------------- Chemical Cauterization Details Patient Name: Date of Service: NA [name]. 12/12/2022 2:15 PM Medical Record Number: [id number] Patient Account Number: [id number] Date of Birth/Sex: Treating RN: 10-07-1955 (67 y.o. [name] Primary Care Provider: [name] Other Clinician: Referring Provider: Treating Provider/Extender: [name] Weeks in Treatment: 23 Procedure Performed for: Wound #2 Left,Medial Ankle Performed By: Physician Colin Guess, MD Post Procedure Diagnosis Same as Pre-procedure Notes scribed for Dr. Lady Gary by Colin Bruin, RN Electronic Signature(s) Signed: 12/12/2022 2:57:10 PM By: Colin Guess MD FACS Signed: 12/12/2022 3:44:30 PM By: Gelene Mink By: [name] on 12/12/2022 14:32:20 -------------------------------------------------------------------------------- Physical Exam Details Patient Name: Date of Service: NA [name]. 12/12/2022 2:15 PM Medical Record Number: [id number] Patient Account Number: [id number] Date of Birth/Sex: Treating RN: 04/25/1956 (67 y.o. M) Primary Care Provider: [name] Other Clinician: LORENSO, [name] ([id number]) [id number] Page 6 of 14 Referring Provider: Treating Provider/Extender: [name] Weeks in Treatment: 23 Constitutional . . . . no acute distress. Respiratory Normal work of breathing on room air. Notes 12/12/2022: The wound was measured the same size with [name] little bit more depth to it today, but visual inspection suggest that it is smaller. There is hypertrophic granulation tissue present. The site on his fourth toe is smaller with some  eschar and slough accumulation. Electronic Signature(s) Signed:  12/12/2022 2:49:30 PM By: Colin Guess MD FACS Entered By: [name] on 12/12/2022 14:49:30 -------------------------------------------------------------------------------- Physician Orders Details Patient Name: Date of Service: NA [name]. 12/12/2022 2:15 PM Medical Record Number: [id number] Patient Account Number: [id number] Date of Birth/Sex: Treating RN: January 29, 1956 (67 y.o. 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 - Dr. Lady Gary - room 2 Return [name] Anesthetic (In clinic) Topical Lidocaine 4% applied to wound bed Bathing/ Shower/ Hygiene May shower with protection but do not get wound dressing(s) wet. Protect dressing(s) with water repellant cover (for example, large plastic bag) or [name] cast cover and may then take shower. Edema Control - Lymphedema / SCD / Other Elevate legs to the level of the heart or above for 30 minutes daily and/or when sitting for 3-4 times [name] day throughout the day. Avoid standing for long periods of time. Off-Loading Open toe surgical shoe to: - left foot Other: - knee scooter to left leg, stop wearing your orthoboot Wound Treatment Wound #2 - Ankle Wound Laterality: Left, Medial Cleanser: Normal Saline 1 x Per Week/30 Days Discharge Instructions: Cleanse the wound with Normal Saline prior to applying [name] clean dressing using gauze sponges, not tissue or cotton balls. Peri-Wound Care: Zinc Oxide Ointment 30g tube 1 x Per Week/30 Days Discharge  Instructions: Apply Zinc Oxide to periwound with each dressing change Peri-Wound Care: Sween Lotion (Moisturizing lotion) 1 x Per Week/30 Days Discharge Instructions: Apply moisturizing lotion as directed Topical: Gentamicin 1 x Per Week/30 Days [name], [name] ([id number]) [phone].pdf Page 7 of 14 Discharge Instructions: As directed by physician Topical: Mupirocin Ointment 1 x Per Week/30 Days Discharge Instructions: Apply Mupirocin (Bactroban) as instructed Prim Dressing: Hydrofera Blue Ready Transfer Foam, 2.5x2.5 (in/in) 1 x Per Week/30 Days ary Discharge Instructions: Apply directly to wound bed as directed Secondary Dressing: Woven Gauze Sponge, Non-Sterile 4x4 in 1 x Per Week/30 Days Discharge Instructions: Apply over primary dressing as directed. Secondary Dressing: Zetuvit Plus 4x8 in (Generic) 1 x Per Week/30 Days Discharge Instructions: Apply over primary dressing as directed. Secured With: Transpore Surgical Tape, 2x10 (in/yd) 1 x Per Week/30 Days Discharge Instructions: Secure dressing with tape as directed. Compression Wrap: Urgo K2 Lite, (equivalent to [name] 3 layer) two layer compression system, regular 1 x Per Week/30 Days Discharge Instructions: Apply Urgo K2 Lite as directed (alternative to 3 layer compression). Wound #4 - T Fourth oe Wound Laterality: Left Cleanser: Soap and Water 1 x Per Day/30 Days Discharge Instructions: May shower and wash wound with dial antibacterial soap and water prior to dressing change. Cleanser: Wound Cleanser 1 x Per Day/30 Days Discharge Instructions: Cleanse the wound with wound cleanser prior to applying [name] clean dressing using gauze sponges, not tissue or cotton balls. Prim Dressing: Maxorb Extra Ag+ Alginate Dressing, 2x2 (in/in) 1 x Per Day/30 Days ary Discharge Instructions: Apply to wound bed as instructed Secondary Dressing: Woven Gauze Sponges 2x2 in 1 x Per Day/30 Days Discharge Instructions: Apply over  primary dressing as directed. Secured With: Web designer,  Sterile 4x75 (in/in) 1 x Per Day/30 Days Discharge Instructions: Secure with stretch gauze as directed. Patient Medications llergies: codeine [name] Notifications Medication Indication Start End 12/12/2022 lidocaine DOSE topical 4 % cream - cream topical Electronic Signature(s) Signed: 12/12/2022 2:57:10 PM By: Colin Guess MD FACS Entered By: [name] on 12/12/2022 14:49:46 -------------------------------------------------------------------------------- Problem List Details Patient Name: Date of Service: NA [name]. 12/12/2022 2:15 PM Medical Record Number: [id number] Patient Account Number: [id number] Date of Birth/Sex: Treating RN: 1956/03/13 (67 y.o. 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 07/04/2022 No Yes ARNOLD, HEATHERINGTON [name] ([id number]) [phone].pdf Page 8 of 14 [phone] Non-pressure chronic ulcer of other part of left foot with fat layer exposed 12/05/2022 No Yes I73.9 Peripheral vascular disease, unspecified 07/04/2022 No Yes E11.622 Type 2 diabetes mellitus with other skin ulcer 07/04/2022 No Yes Inactive Problems Resolved Problems Electronic Signature(s) Signed: 12/12/2022 2:43:16 PM By: Colin Guess MD FACS Entered By: [name] on 12/12/2022 14:43:15 -------------------------------------------------------------------------------- Progress Note Details Patient Name: Date of Service: NA Colin [name]. 12/12/2022 2:15 PM Medical Record Number: [id number] Patient Account Number: [id number] Date of Birth/Sex: Treating RN: 08-14-1955 (67 y.o. M) Primary Care Provider: [name] Other  Clinician: Referring Provider: Treating Provider/Extender: [name] in Treatment: 23 Subjective Chief Complaint Information obtained from Patient Patients presents for treatment of an open diabetic ulcer with exposed bone and osteomyelitis 07/04/2022: Diabetic ankle ulcer History of Present Illness (HPI) ADMISSION 08/25/2021 This is [name] 67 year old man who initially presented to his primary care provider in September 2022 with pain in his left foot. He was sent for an x-ray and while the x-ray was being performed, the tech pointed out [name] wound on his foot that the patient was not aware existed. He does have type 2 diabetes with significant neuropathy. His diabetes is suboptimally controlled with his most recent A1c being 8.5. He also has [name] history of coronary artery disease status post three- vessel CABG. he was initially seen by orthopedics, but they referred him to [facility]. He has undergone at least 7 operations/debridements and several applications of skin substitute under the care of podiatry. He has been in [name] for much of this time. His most recent procedure was July 28, 2021. [name] was biopsied and was found to be consistent with osteomyelitis. Culture also returned positive for corynebacterium. He was seen on August 16, 2021 by infectious disease. [name] PICC line has been placed and he will be receiving [name] 6-week course of IV daptomycin and cefepime. In October 2022, he underwent lower extremity vascular studies. Results are copied here: Right: Resting right ankle-brachial index is within normal range. No evidence of significant right lower extremity arterial disease. The right toe-brachial index is abnormal. Left: Resting left ankle-brachial index indicates mild left lower extremity arterial disease. The left toe-brachial index is abnormal. He has not been seen by vascular surgery despite these findings. He presented  to clinic today in [name] and is using [name] to offload. Wound VAC was in place. Once this was removed, [name] large ulcer was identified on the left midfoot/ankle. Bone is frankly exposed. There is no malodorous or purulent drainage. There is some granulation tissue over the central portion of  the exposed bone. There is [name] tunnel that extends posteriorly for roughly 10 cm. It has been discussed with him by multiple providers that he is at very high risk of losing his lower leg because of this wound. He is extremely eager to avoid this outcome and is here today to review his options as well as receive ongoing wound care. 09/03/2021: Here for reevaluation of his wound. There does not appear to have been any substantial improvement overall since our last visit. He has been in [name] with white foam overlying the exposed bone. We are working on getting him approved for hyperbaric oxygen therapy. 09/10/2021: We are in the process of getting him cleared to begin hyperbaric oxygen therapy. He still needs to obtain [name] chest x-ray. Although the wound measurements are roughly the same, I think the overall appearance of the wound is better. The exposed bone has [name] bit more granulation tissue covering it. He has not received [name] vascular surgery appointment to reevaluate his flow to the wound. [name], [name] [id number] Page 9 of 14 09/17/2021: He has been approved for hyperbaric oxygen therapy and completed his chest x-ray, which I reviewed and it appears normal. The tunnels at the 12 and 10:00 positions are smaller. There is more granulation tissue covering the exposed bone and the undermining has decreased. He still has not received [name] vascular surgery appointment. 09/24/2021: He initiated hyperbaric oxygen therapy this week and is tolerating it well. He has an appointment with vascular surgery coming up on May 16. The granulation tissue is covering more of the  exposed bone and both tunnels are [name] bit smaller. 10/01/2021: He continues to tolerate hyperbaric oxygen therapy. He saw infectious disease and they are planning to pull his PICC line. He has been initiated on oral antibiotics (doxycycline and Augmentin). The wound looks about the same but the tunnels are [name] little bit smaller. The skin seems to be contracting somewhat around the exposed bone. 10/08/2021: The wound is still about the same size, but the tunnels continue to come in and the skin is contracting around the exposed bone. He continues to have some accumulation of necrotic material in the inferoposterior aspect of the wound as well as accumulation at the 12:00 tunnel area. 10/15/2021: The wound is smaller today. The tunnels continue to come in. There is less necrotic tissue present. He does have some periwound maceration. 10/22/2021: The wound is about the same size. There is [name] little bit less undermining at the distal portion. The exposed bone is dark and I am not sure if this is staining from silver nitrate or his VAC sponge or if it represents necrosis. The tunnels are shallower but he does have some serous drainage coming from the 10:00 tunnel. He continues to tolerate hyperbaric oxygen therapy well. 10/29/2021: The undermining continues to improve. The tunnels are about the same. He has good granulation tissue overlying the majority of the exposed bone. It does appear that perhaps the tubing from his wound VAC has been eroding the skin at the 12 clock position. He continues to accumulate senescent epithelium around the borders of the wound. 11/05/2021: The undermining is almost completely resolved. The tunnels have contracted fairly significantly. No significant slough or debris accumulation. There is still senescent epithelium accumulation around the borders of the wound. He has been tolerating hyperbaric oxygen therapy well. 11/12/2021: Despite the measurements of the wound being about the same,  the wound has changed in its shape and overall, I think  it is improved. The undermining has resolved and the tunnels continue to shorten. There is good granulation tissue encroaching over the small area of bone that has remained exposed at the 12 o'clock position. Minimal slough accumulation. He continues to tolerate hyperbaric oxygen therapy well. 11/19/2021: I took [name] PCR culture last week. There was overgrowth of yeast. He is already taking suppressive doxycycline and Augmentin. I added fluconazole to his regimen. The wound is smaller again today. The tunnels continue to shorten. He continues to do well with hyperbaric oxygen therapy. 11/26/2021: For some reason, his foot has become macerated. The wound is narrower but about the same dimensions in its longitudinal aspect. The tunnels continue to shorten. He has some slough buildup on the wound as well as some heaped up senescent epithelium around the perimeter. 12/03/2021: No further maceration of his foot has occurred. The wound has contracted quite significantly from last week. The tunnel at 10:00 is closed. The tunnel at 12:00 is down to just [name] couple of millimeters. No other undermining is present. There is soft tissue coverage of the previously exposed bone. There is just [name] bit of slough and biofilm on the wound surface. 12/10/2021: The wound is looking good. It turns out the tunnel at 12:00 is only exposed when the patient dorsiflexes his foot. It is about 2 cm in depth when he does this; when his foot is in plantarflexion, the tunnel is closed. The bone that was visible at the 12:00 tunnel is completely covered with granulation tissue, but there does feel like some exposed bone deeper into the tunnel area. There is senescent skin heaped up around the periphery. Minimal slough on the wound surface. 12/16/2021: The wound dimensions are roughly the same. The surface has nice granulation tissue. The exposed bone at the 12:00 tunnel continues to be  covered with more soft tissue. 7/14; patient's wound measures smaller today. Using the wound VAC with underlying collagen. He is also being treated with HBO for underlying osteomyelitis. He tells me he is on doxycycline and ampicillin follows with infectious disease next week 12/31/2021: The wound continues to contract. Unfortunately, the area where the track pad and tubing have been rubbing continues to look like it is applying friction. He says that the home health nurses that have been applying the [facility] have been putting gauze underneath the tubing, but nonetheless there is ongoing tissue breakdown at this site. Light slough accumulation on the wound surface. The tunnel continues to contract. He is tolerating HBO without difficulty. 01/07/2022: Bridging the wound VAC away from the ankle has resulted in significant improvement in the tissue at the apex of the wound. The tunnel is still present and is not all that much shorter, but the overall wound surface is very robust and healthy looking. Minimal slough accumulation. No concern for acute infection. 01/21/2022: The wound continues to contract and has [name] robust granulation tissue surface. The tunnel has come in considerably and is down to about 1.4 cm. There is still bone exposed within the tunnel but the rest of it is well covered. There is some senescent epithelium at the wound margins and [name] little bit of slough on the surface. 01/28/2022: No significant change in the wound this week, but there has not been any reaccumulation of senescent epithelium or slough. The tunnel is perhaps [name] millimeter less in depth. He has been approved for Apligraf and we will apply this today. 02/04/2022: The wound has contracted somewhat and the tunnel has filled in completely. The  wound surface is clean. He is here for Apligraf #2. 02/11/2022: The wound has contracted further and is now nearly flush with the surrounding skin surface. Light layer of slough. Apligraf #3 plan  for today. 02/18/2022: There is [name] band of epithelium trying to cut across the superior portion of the wound. There is robust granulation tissue with just [name] light layer of slough and biofilm on the surface. There was [name] little bit of greenish drainage in the wound VAC but none appreciated on the site itself. He is here for Apligraf #4. 03/04/2022: The wound has contracted considerably. There is good granulation tissue on the surface. Minimal biofilm. He is here for Apligraf #5. 03/18/2022: The wound has epithelialized to the point that it has been divided into 2 areas. Both areas are smaller in total than at his previous visit. There is good granulation tissue present with just [name] little bit of periwound eschar and surface slough. 10/13; left medial foot. The patient has completed treatment with hyperbaric oxygen for underlying osteomyelitis he has had [name] nice response in the wound. Noted today that he still had some greenish drainage. Previously he has been treated with topical gentamicin but apparently that was stopped 2 weeks ago. Fortunately the wound is measuring smaller 10/20; wound looks better no hypergranulation minimal drainage. Granulation tissue looks healthy using gentamicin Hydrofera Blue under compression 04/08/2022: The wound continues to contract tremendously. The response to his treatment for hypertrophic granulation tissue has been quite remarkable. There are just [name] couple of open areas remaining. 04/18/2022: His wound is healed. [name], [name] [id number] Page 10 of 14 07/04/2022: He returns to clinic with [name] new ulcer adjacent to where his previous large wound had been. He says that he was wearing boots and using [name] small padded dressing to protect the freshly healed ankle wound. Apparently the dressing rolled up and rubbed on his skin, causing [name] new wound. He contacted our office last week concerned about the appearance. There is [name]  small oval wound on his left medial ankle. It is fairly clean with some periwound dry skin and eschar. 07/11/2022: The wound is about the same size this week. There is [name] layer of rubbery slough on the surface. It has [name] fairly strong odor. 07/18/2022: The wound measured larger this week due to small satellite areas opening in [name] fan pattern distal to the primary wound. He continues to accumulate slough on the wound surface. The culture that I took last week only grew out low levels of skin flora. 07/25/2022: The satellite areas that had opened last week have closed. The main wound is smaller with some slough accumulation. 08/01/2022: The wound continues to contract. There is [name] layer of slough on the surface. 08/08/2022: The wound is [name] little bit smaller again this week. Still with slough buildup. 3/18; patient presents for follow-up. He has been doing dressing changes on his own with silver alginate and antibiotic ointment. He has been at [employer] over the past 3 weeks. He has developed [name] new wound. 09/05/2022: I am not sure if the new wound that was reported last week is just the satellite area that had closed [name] couple of weeks ago and reopened. Regardless, both wounds are quite small with good epithelialization. There is some eschar buildup around the periphery of both wounds with slough on both wound surfaces. 09/12/2022: The wounds have converged. There is some increased in depth at the distal portion. This seems to be secondary to moisture accumulation  as the periwound skin is [name] bit macerated. 09/26/2022: The wound has deteriorated further. There is increased depth at the distal portion and the wound probes to bone. The patient theorizes that the boot he has been wearing is rubbing in this spot and upon inspection of the boot, there is actually [name] hard plastic boot in exactly this location, supporting his theory. 10/05/2022: The wound looks [name] little bit better this week. The area that probed to bone now  has some tissue coverage. There is still some slough buildup on the wound surfaces. 10/10/2022: The wound is strongly malodorous today. There is no purulent drainage, nor any increased erythema. There is some tissue maceration around the wound, but he did not have [name] whole lot of drainage on his dressings. There is slough on the wound surface. 10/17/2022: The wound still has [name] fairly strong odor and the drainage has increased. It is goopy and purulent. The culture that I took last week returned late on Thursday with methicillin and tetracycline resistant staph epidermidis and multiple other staph species at fairly high concentrations. 10/27/2022: The wound has cleaned up considerably. There is no odor and less drainage. The drainage is no longer purulent. He is taking Bactrim as prescribed. 11/03/2022: He completed his course of Bactrim however the wound has deteriorated, has [name] foul odor, and there are fragments of bone breaking off in his wound. His drainage was dark and purulent. 11/08/2022: As expected, the bone biopsy returned with osteomyelitis and osteonecrosis. His culture returned with [name] number of species with the Levaquin I had already prescribed being along with the first-line choice as suggested by the pharmacist. Notably, however, Pseudomonas aeruginosa was not among the species cultivated. He has an appointment to see Dr. Daiva Eves on Wednesday the 19th; he has vascular follow-up on Thursday the sixth. The wound actually looks [name] little bit better today. There are no chunks of bone falling out, nor am I actually able to probe or palpate bone. He did have [name] bit more drainage, but this was noted as murky or purulent as at his previous visit. There is slough buildup on the wound surface and the granulation tissue present is [name] little bit hypertrophic and friable. 11/15/2022: The wound actually looks [name] little bit better today. I can still palpate bone with his foot in [name] relaxed, plantarflexed position,  but there does seem to be some tissue covering it. The drainage has improved. He has completed the oral levofloxacin that I prescribed. He is going to see vascular surgery on Thursday and has an appointment with infectious disease on Wednesday the 19th. 11/21/2022: There is just [name] tiny little spike of bone that is still palpable. Everything else looks improved. He is having an angiogram next Friday to see if he can be optimized from an arterial inflow perspective. 11/28/2022: The bone is completely covered with soft tissue. The wound is smaller. 12/05/2022: He had his angiogram last week and 3 vessels in his lower leg was stented. The ankle wound is smaller again today with minimal slough accumulation. He managed to tear his toenail off of his fourth toe today when he was removing his socks. This has left [name] wound with the nailbed/fat layer exposed. 12/12/2022: The wound was measured the same size with [name] little bit more depth to it today, but visual inspection suggest that it is smaller. There is hypertrophic granulation tissue present. The site on his fourth toe is smaller with some eschar and slough accumulation. Patient History Information obtained  from Patient. Family History Cancer - Father, Diabetes - Father,Mother,Paternal Grandparents, Heart Disease - Father, Hypertension - Father, No family history of Hereditary Spherocytosis, Kidney Disease, Lung Disease, Seizures, Stroke, Thyroid Problems, Tuberculosis. Social History Never smoker, Marital Status - Married, Alcohol Use - Rarely, Drug Use - No History, Caffeine Use - Daily. Medical History Eyes Patient has history of Cataracts - Removed 2008 Cardiovascular Patient has history of Coronary Artery Disease, Hypertension, Myocardial Infarction, Peripheral Arterial Disease Endocrine Patient has history of Type II Diabetes Musculoskeletal Patient has history of Osteomyelitis Neurologic Patient has history of Neuropathy Medical [name] Surgical  History Notes nd Cardiovascular Hypercholesterolemia Abnormal EKG CABG X3 2019 [name], MALECKI [name] ([id number]) [phone].pdf Page 11 of 14 Gastrointestinal GERD Musculoskeletal Diabetic foot ulcer Objective Constitutional no acute distress. Vitals Time Taken: 2:15 PM, Height: 74 in, Weight: 187 lbs, BMI: 24, Temperature: 97.9 F, Pulse: 80 bpm, Respiratory Rate: 16 breaths/min, Blood Pressure: 121/74 mmHg, Capillary Blood Glucose: 103 mg/dl. Respiratory Normal work of breathing on room air. General Notes: 12/12/2022: The wound was measured the same size with [name] little bit more depth to it today, but visual inspection suggest that it is smaller. There is hypertrophic granulation tissue present. The site on his fourth toe is smaller with some eschar and slough accumulation. Integumentary (Hair, Skin) Wound #2 status is Open. Original cause of wound was Pressure Injury. The date acquired was: 06/13/2022. The wound has been in treatment 23 weeks. The wound is located on the Left,Medial Ankle. The wound measures 0.7cm length x 1.5cm width x 0.5cm depth; 0.825cm^2 area and 0.412cm^3 volume. There is Fat Layer (Subcutaneous Tissue) exposed. There is no tunneling or undermining noted. There is [name] medium amount of serosanguineous drainage noted. The wound margin is distinct with the outline attached to the wound base. There is large (67-100%) red, hyper - granulation within the wound bed. There is [name] small (1- 33%) amount of necrotic tissue within the wound bed including Adherent Slough. The periwound skin appearance had no abnormalities noted for texture. The periwound skin appearance had no abnormalities noted for color. The periwound skin appearance exhibited: Maceration. Periwound temperature was noted as No Abnormality. Wound #4 status is Open. Original cause of wound was Skin T ear/Laceration. The date acquired was: 12/05/2022. The wound has been in treatment 1 weeks. The wound  is located on the Left T Fourth. The wound measures 0.3cm length x 0.4cm width x 0.1cm depth; 0.094cm^2 area and 0.009cm^3 volume. The wound oe is limited to skin breakdown. There is no tunneling or undermining noted. There is [name] none present amount of drainage noted. The wound margin is distinct with the outline attached to the wound base. There is no granulation within the wound bed. There is no necrotic tissue within the wound bed. The periwound skin appearance had no abnormalities noted for texture. The periwound skin appearance had no abnormalities noted for moisture. The periwound skin appearance had no abnormalities noted for color. Periwound temperature was noted as No Abnormality. 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 There was [name] Selective/Open Wound Non-Viable Tissue Debridement with [name] total area of 0.82 sq cm performed by  Colin Guess, MD. With the following instrument(s): Curette to remove Non-Viable tissue/material. Material removed includes [facility] after achieving pain control using Lidocaine 4% T opical Solution. No specimens were taken. [name] time out was conducted at 14:30, prior to the start of the procedure. [name] Minimum amount of bleeding was controlled with Pressure. The procedure was tolerated well. Post Debridement Measurements: 0.7cm length x 1.5cm width x 0.5cm depth; 0.412cm^3 volume. Character of Wound/Ulcer Post Debridement is improved. Severity of Tissue Post Debridement is: Fat layer exposed. Post procedure Diagnosis Wound #2: Same as Pre-Procedure General  Notes: scribed for Dr. Lady Gary by Colin Bruin, RN. Pre-procedure diagnosis of Wound #2 is [name] Diabetic Wound/Ulcer of the Lower Extremity located on the Left,Medial Ankle . There was [name] Double Layer Compression Therapy Procedure by Burt Ek, RN. Post procedure Diagnosis Wound #2: Same as Pre-Procedure Pre-procedure diagnosis of Wound #2 is [name] Diabetic Wound/Ulcer of the Lower Extremity located on the Left,Medial Ankle . An Chemical Cauterization procedure was performed by Colin Guess, MD. Post procedure Diagnosis Wound #2: Same as Pre-Procedure Notes: scribed for Dr. Lady Gary by Colin Bruin, RN Wound #4 YOUSSEF, VELDE [name] [id number] Page 12 of 14 Pre-procedure diagnosis of Wound #4 is [name] Diabetic Wound/Ulcer of the Lower Extremity located on the Left T Fourth .Severity of Tissue Pre Debridement is: oe Fat layer exposed. There was [name] Selective/Open Wound Non-Viable Tissue Debridement with [name] total area of 0.09 sq cm performed by Colin Guess, MD. With the following instrument(s): Curette to remove Non-Viable tissue/material. Material removed includes Eschar and Slough and after achieving pain control using Lidocaine 4% T opical Solution. No specimens were taken. [name] time out was conducted at 14:30, prior to the start of the procedure. [name] Minimum amount of bleeding was controlled with Pressure. The procedure was tolerated well. Post Debridement Measurements: 0.3cm length x 0.4cm width x 0.1cm depth; 0.009cm^3 volume. Character of Wound/Ulcer Post Debridement is improved. Severity of Tissue Post Debridement is: Fat layer exposed. Post procedure Diagnosis Wound #4: Same as Pre-Procedure General Notes: scribed for Dr. Lady Gary by Colin Bruin, RN. Plan Follow-up Appointments: Return Appointment in 1 week. - Dr. Lady Gary - room 2 Anesthetic: (In clinic) Topical Lidocaine 4% applied to wound bed Bathing/ Shower/ Hygiene: May shower with  protection but do not get wound dressing(s) wet. Protect dressing(s) with water repellant cover (for example, large plastic bag) or [name] cast cover and may then take shower. Edema Control - Lymphedema / SCD / Other: Elevate legs to the level of the heart or above for 30 minutes daily and/or when sitting for 3-4 times [name] day throughout the day. Avoid standing for long periods of time. Off-Loading: Open toe surgical shoe to: - left foot Other: - knee scooter to left leg, stop wearing your orthoboot The following medication(s) was prescribed: lidocaine topical 4 % cream cream topical was prescribed at facility WOUND #2: - Ankle Wound Laterality: Left, Medial Cleanser: Normal Saline 1 x Per Week/30 Days Discharge Instructions: Cleanse the wound with Normal Saline prior to applying [name] clean dressing using gauze sponges, not tissue or cotton balls. Peri-Wound Care: Zinc Oxide Ointment 30g tube 1 x Per Week/30 Days Discharge Instructions: Apply Zinc Oxide to periwound with each dressing change Peri-Wound Care: Sween Lotion (Moisturizing lotion) 1 x Per Week/30 Days Discharge Instructions: Apply moisturizing lotion as directed Topical: Gentamicin 1 x Per Week/30 Days Discharge Instructions: As directed by physician Topical: Mupirocin Ointment 1 x Per Week/30 Days Discharge Instructions: Apply Mupirocin (Bactroban) as instructed  Prim Dressing: Hydrofera Blue Ready Transfer Foam, 2.5x2.5 (in/in) 1 x Per Week/30 Days ary Discharge Instructions: Apply directly to wound bed as directed Secondary Dressing: Woven Gauze Sponge, Non-Sterile 4x4 in 1 x Per Week/30 Days Discharge Instructions: Apply over primary dressing as directed. Secondary Dressing: Zetuvit Plus 4x8 in (Generic) 1 x Per Week/30 Days Discharge Instructions: Apply over primary dressing as directed. Secured With: Transpore Surgical T ape, 2x10 (in/yd) 1 x Per Week/30 Days Discharge Instructions: Secure dressing with tape as directed. Com  pression Wrap: Urgo K2 Lite, (equivalent to [name] 3 layer) two layer compression system, regular 1 x Per Week/30 Days Discharge Instructions: Apply Urgo K2 Lite as directed (alternative to 3 layer compression). WOUND #4: - T Fourth Wound Laterality: Left oe Cleanser: Soap and Water 1 x Per Day/30 Days Discharge Instructions: May shower and wash wound with dial antibacterial soap and water prior to dressing change. Cleanser: Wound Cleanser 1 x Per Day/30 Days Discharge Instructions: Cleanse the wound with wound cleanser prior to applying [name] clean dressing using gauze sponges, not tissue or cotton balls. Prim Dressing: Maxorb Extra Ag+ Alginate Dressing, 2x2 (in/in) 1 x Per Day/30 Days ary Discharge Instructions: Apply to wound bed as instructed Secondary Dressing: Woven Gauze Sponges 2x2 in 1 x Per Day/30 Days Discharge Instructions: Apply over primary dressing as directed. Secured With: Web designer, Sterile 4x75 (in/in) 1 x Per Day/30 Days Discharge Instructions: Secure with stretch gauze as directed. 12/12/2022: The wound was measured the same size with [name] little bit more depth to it today, but visual inspection suggest that it is smaller. There is hypertrophic granulation tissue present. The site on his fourth toe is smaller with some eschar and slough accumulation. I used [name] curette to debride slough and eschar from the fourth toe wound. I debrided slough from the ankle wound and then chemically cauterized the hypertrophic granulation tissue with silver nitrate. We will continue the mixture of topical gentamicin and mupirocin on the ankle with Hydrofera Blue. Continue silver alginate to the toe. Continue Urgo lite 3 layer compression wrap. Follow-up in 1 week. Electronic Signature(s) Signed: 12/12/2022 2:50:48 PM By: Colin Guess MD FACS Entered By: [name] on 12/12/2022 14:50:48 [name] [id number] Page 13 of  14 -------------------------------------------------------------------------------- HxROS Details Patient Name: Date of Service: NA [name]. 12/12/2022 2:15 PM Medical Record Number: [id number] Patient Account Number: [id number] Date of Birth/Sex: Treating RN: 20-Jun-1955 (67 y.o. M) Primary Care Provider: [name] Other Clinician: Referring Provider: Treating Provider/Extender: [name] Weeks in Treatment: 23 Information Obtained From Patient Eyes Medical History: Positive for: Cataracts - Removed 2008 Cardiovascular Medical History: Positive for: Coronary Artery Disease; Hypertension; Myocardial Infarction; Peripheral Arterial Disease Past Medical History Notes: Hypercholesterolemia Abnormal EKG CABG X3 2019 Gastrointestinal Medical History: Past Medical History Notes: GERD Endocrine Medical History: Positive for: Type II Diabetes Time with diabetes: 24 years Treated with: Insulin, Oral agents Blood sugar tested every day: Yes Tested : Musculoskeletal Medical History: Positive for: Osteomyelitis Past Medical History Notes: Diabetic foot ulcer Neurologic Medical History: Positive for: Neuropathy HBO Extended History Items Eyes: Cataracts Immunizations Pneumococcal Vaccine: Received Pneumococcal Vaccination: Yes Received Pneumococcal Vaccination On or After 60th Birthday: Yes Implantable Devices Yes Family and Social History Cancer: Yes - Father; Diabetes: Yes - Father,Mother,Paternal Grandparents; Heart Disease: Yes - Father; Hereditary Spherocytosis: No; Hypertension: Yes - Father; Kidney Disease: No; Lung Disease: No; Seizures: No; Stroke: No; Thyroid Problems: No; Tuberculosis: No; Never smoker;  Marital Status - Married; Alcohol Use: Rarely; Drug Use: No History; Caffeine Use: Daily; Financial Concerns: No; Food, Clothing or Shelter Needs: No; Support System Lacking: No; Transportation Concerns: No MANSUR, SANDUSKY [name]  [id number] Page 14 of 14 Electronic Signature(s) Signed: 12/12/2022 2:57:10 PM By: Colin Guess MD FACS Entered By: [name] on 12/12/2022 14:49:00 -------------------------------------------------------------------------------- SuperBill Details Patient Name: Date of Service: NA Colin [name]. 12/12/2022 Medical Record Number: [id number] Patient Account Number: [id number] Date of Birth/Sex: Treating RN: 01-Jul-1955 (67 y.o. 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 L97.522 Non-pressure chronic ulcer of other part of left foot with fat layer exposed Modifier: Quantity: 1 Physician Procedures : CPT4 Code Description Modifier [id number] - WC PHYS LEVEL 4 - EST PT 25 ICD-10 Diagnosis Description L97.324 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

## 2022-12-12 NOTE — Progress Notes (Signed)
NAFEES, STEUER [name] [id number] Page 1 of 10 Visit Report for 12/12/2022 Arrival Information Details Patient Name: Date of Service: [name] 12/12/2022 2:15 PM Medical Record Number: [id number] Patient Account Number: [id number] Date of Birth/Sex: Treating RN: May 21, 1956 (67 y.o. [name] Primary Care Landrey Mahurin: [name] Other Clinician: Referring Cinda Hara: Treating Rilan Eiland/Extender: [name] in Treatment: 23 Visit Information History Since Last Visit Added or deleted any medications: No Patient Arrived: Knee Scooter Any new allergies or adverse reactions: No Arrival Time: 14:15 Had [name] fall or experienced change in No Accompanied By: self activities of daily living that may affect Transfer Assistance: None risk of falls: Patient Identification Verified: Yes Signs or symptoms of abuse/neglect since last visito No Secondary Verification Process Completed: Yes Hospitalized since last visit: No Patient Requires Transmission-Based Precautions: No Implantable device outside of the clinic excluding No Patient Has Alerts: No cellular tissue based products placed in the center since last visit: Has Dressing in Place as Prescribed: Yes Has Compression in Place as Prescribed: Yes Pain Present Now: No Electronic Signature(s) Signed: 12/12/2022 3:44:30 PM By: Samuella Bruin Entered By: Samuella Bruin on 12/12/2022 14:15:26 -------------------------------------------------------------------------------- Compression Therapy Details Patient Name: Date of Service: [name]. 12/12/2022 2:15 PM Medical Record Number: [id number] Patient Account Number: [id number] Date of Birth/Sex: Treating RN: [birth year] (67 y.o. [name] Primary Care Daren Doswell: [name] Other Clinician: Referring Donis Pinder: Treating Zyanna Leisinger/Extender: [name]: 23 Compression  Therapy Performed for Wound Assessment: Wound #2 Left,Medial Ankle Performed By: Clinician Samuella Bruin, RN Compression Type: Double Layer Post Procedure Diagnosis Same as Pre-procedure Electronic Signature(s) Signed: 12/12/2022 3:44:30 PM By: Samuella Bruin Entered By: Samuella Bruin on 12/12/2022 14:36:18 [name] [id number] Page 2 of 10 -------------------------------------------------------------------------------- Encounter Discharge Information Details Patient Name: Date of Service: [name] 12/12/2022 2:15 PM Medical Record Number: [id number] Patient Account Number: [id number] Date of Birth/Sex: Treating RN: 1955-07-27 (67 y.o. [name] Primary Care Kimbley Sprague: [name] Other Clinician: Referring Elfego Giammarino: Treating Mansa Willers/Extender: [name] in Treatment: 23 Encounter Discharge Information Items Post Procedure Vitals Discharge Condition: Stable Temperature (F): 97.9 Ambulatory Status: Knee Scooter Pulse (bpm): 80 Discharge Destination: Home Respiratory Rate (breaths/min): 16 Transportation: Private Auto Blood Pressure (mmHg): 121/74 Accompanied By: self Schedule Follow-up Appointment: Yes Clinical Summary of Care: Patient Declined Electronic Signature(s) Signed: 12/12/2022 3:44:30 PM By: Samuella Bruin Entered By: Samuella Bruin on 12/12/2022 14:35:49 -------------------------------------------------------------------------------- Lower Extremity Assessment Details Patient Name: Date of Service: [name]. 12/12/2022 2:15 PM Medical Record Number: [id number] Patient Account Number: [id number] Date of Birth/Sex: Treating RN: 05-07-1956 (67 y.o. [name] Primary Care Kashis Penley: [name] Other Clinician: Referring Jorey Dollard: Treating Nazier Neyhart/Extender: [name]: 23 Edema Assessment Assessed: [Left:  No] [Right: No] Edema: [Left: Ye] [Right: s] Calf Left: Right: Point of Measurement: From Medial Instep 32.8 cm Ankle Left: Right: Point of Measurement: From Medial Instep 21.8 cm Vascular Assessment Pulses: Dorsalis Pedis Palpable: [Left:Yes] Electronic Signature(s) Signed: 12/12/2022 3:44:30 PM By: Samuella Bruin Entered By: Samuella Bruin on 12/12/2022 14:20:32 Multi Wound Chart Details -------------------------------------------------------------------------------- [name] ([id number]) [id number] Page 3 of 10 Patient Name: Date of Service: [name] 12/12/2022 2:15 PM Medical Record Number: [id number] Patient Account Number: [id number] Date of Birth/Sex: Treating RN: 30-Mar-1956 (67 y.o. M) Primary Care Rennie Hack: [name] Other Clinician: Referring Deiondra Denley:  Treating Allisen Pidgeon/Extender: [name]: 23 Vital Signs Height(in): 74 Capillary Blood Glucose(mg/dl): 161 Weight(lbs): 096 Pulse(bpm): 80 Body Mass Index(BMI): 24 Blood Pressure(mmHg): 121/74 Temperature(F): 97.9 Respiratory Rate(breaths/min): 16 Wound Assessments Wound Number: 2 4 N/[name] Photos: N/[name] Left, Medial Ankle Left T Fourth oe N/[name] Wound Location: Pressure Injury Skin T ear/Laceration N/[name] Wounding Event: Diabetic Wound/Ulcer of the Lower Diabetic Wound/Ulcer of the Lower N/[name] Primary Etiology: Extremity Extremity Cataracts, Coronary Artery Disease, Cataracts, Coronary Artery Disease, N/[name] Comorbid History: Hypertension, Myocardial Infarction, Hypertension, Myocardial Infarction, Peripheral Arterial Disease, Type II Peripheral Arterial Disease, Type II Diabetes, Osteomyelitis, Neuropathy Diabetes, Osteomyelitis, Neuropathy 06/13/2022 12/05/2022 N/[name] Date Acquired: 23 1 N/[name] Weeks of Treatment: Open Open N/[name] Wound Status: No No N/[name] Wound Recurrence: 0.7x1.5x0.5 0.3x0.4x0.1 N/[name] Measurements L x W x D (cm) 0.825 0.094  N/[name] [name] (cm) : rea 0.412 0.009 N/[name] Volume (cm) : 59.60% 90.00% N/[name] % Reduction in [name] rea: -102.00% 90.40% N/[name] % Reduction in Volume: Grade 3 Grade 1 N/[name] Classification: Medium None Present N/[name] Exudate [name] mount: Serosanguineous N/[name]/[name] Exudate Type: red, brown N/[name]/[name] Exudate Color: Distinct, outline attached Distinct, outline attached N/[name] Wound Margin: Large (67-100%) None Present (0%) N/[name] Granulation [name] mount: Red, Hyper-granulation N/[name]/[name] Granulation Quality: Small (1-33%) None Present (0%) N/[name] Necrotic [name] mount: Fat Layer (Subcutaneous Tissue): Yes Fascia: No N/[name] Exposed Structures: Fascia: No Fat Layer (Subcutaneous Tissue): No Tendon: No Tendon: No Muscle: No Muscle: No Joint: No Joint: No Bone: No Bone: No Limited to Skin Breakdown Medium (34-66%) Large (67-100%) N/[name] Epithelialization: Debridement - Selective/Open Wound Debridement - Selective/Open Wound N/[name] Debridement: Pre-procedure Verification/Time Out 14:30 14:30 N/[name] Taken: Lidocaine 4% Topical Solution Lidocaine 4% Topical Solution N/[name] Pain Control: Ambulance person, Slough N/[name] Tissue Debrided: Non-Viable Tissue Non-Viable Tissue N/[name] Level: 0.82 0.09 N/[name] Debridement [name] (sq cm): rea Curette Curette N/[name] Instrument: Minimum Minimum N/[name] Bleeding: Pressure Pressure N/[name] Hemostasis [name] chieved: Procedure was tolerated well Procedure was tolerated well N/[name] Debridement Treatment Response: 0.7x1.5x0.5 0.3x0.4x0.1 N/[name] Post Debridement Measurements L x W x D (cm) 0.412 0.009 N/[name] Post Debridement Volume: (cm) No Abnormalities Noted No Abnormalities Noted N/[name] Periwound Skin Texture: Maceration: Yes No Abnormalities Noted N/[name] Periwound Skin Moisture: No Abnormalities Noted No Abnormalities Noted N/[name] Periwound Skin Color: No Abnormality No Abnormality N/[name] Temperature: Chemical Cauterization Debridement N/[name] Procedures Performed: Compression Therapy Debridement TAWN, HILL [name] ([id number])  [id number] Page 4 of 10 Treatment Notes Wound #2 (Ankle) Wound Laterality: Left, Medial Cleanser Normal Saline Discharge Instruction: Cleanse the wound with Normal Saline prior to applying [name] clean dressing using gauze sponges, not tissue or cotton balls. Peri-Wound Care Zinc Oxide Ointment 30g tube Discharge Instruction: Apply Zinc Oxide to periwound with each dressing change Sween Lotion (Moisturizing lotion) Discharge Instruction: Apply moisturizing lotion as directed Topical Gentamicin Discharge Instruction: As directed by physician Mupirocin Ointment Discharge Instruction: Apply Mupirocin (Bactroban) as instructed Primary Dressing Hydrofera Blue Ready Transfer Foam, 2.5x2.5 (in/in) Discharge Instruction: Apply directly to wound bed as directed Secondary Dressing Woven Gauze Sponge, Non-Sterile 4x4 in Discharge Instruction: Apply over primary dressing as directed. Zetuvit Plus 4x8 in Discharge Instruction: Apply over primary dressing as directed. Secured With Transpore Surgical Tape, 2x10 (in/yd) Discharge Instruction: Secure dressing with tape as directed. Compression Wrap Urgo K2 Lite, (equivalent to [name] 3 layer) two layer compression system, regular Discharge Instruction: Apply Urgo K2 Lite as directed (alternative to 3 layer compression). Compression Stockings Add-Ons Wound #4 (Toe Fourth) Wound Laterality: Left Cleanser Soap and Water Discharge  Instruction: May shower and wash wound with dial antibacterial soap and water prior to dressing change. Wound Cleanser Discharge Instruction: Cleanse the wound with wound cleanser prior to applying [name] clean dressing using gauze sponges, not tissue or cotton balls. Peri-Wound Care Topical Primary Dressing Maxorb Extra Ag+ Alginate Dressing, 2x2 (in/in) Discharge Instruction: Apply to wound bed as instructed Secondary Dressing Woven Gauze Sponges 2x2 in Discharge Instruction: Apply over primary  dressing as directed. Secured With Conforming Stretch Gauze Bandage Roll, Sterile 4x75 (in/in) Discharge Instruction: Secure with stretch gauze as directed. Compression Wrap Compression Stockings Add-Ons Electronic Signature(s) AURELIANO, GOSNEY [name] ([id number]) [id number] Page 5 of 10 Signed: 12/12/2022 2:44:25 PM By: [name] MD FACS Entered By: [name] on 12/12/2022 14:44:25 -------------------------------------------------------------------------------- Multi-Disciplinary Care Plan Details Patient Name: Date of Service: Colin [name]. 12/12/2022 2:15 PM Medical Record Number: [id number] Patient Account Number: [id number] Date of Birth/Sex: Treating RN: 04-27-1956 (67 y.o. [name] Primary Care Desira Alessandrini: [name] Other Clinician: Referring Lexxi Koslow: Treating Lisett Dirusso/Extender: [name] in Treatment: 23 Multidisciplinary Care Plan reviewed with physician Active Inactive Pressure Nursing Diagnoses: Knowledge deficit related to causes and risk factors for pressure ulcer development Knowledge deficit related to management of pressures ulcers Goals: Patient will remain free of pressure ulcers Date Initiated: 07/04/2022 Target Resolution Date: 01/13/2023 Goal Status: Active Interventions: Assess: immobility, friction, shearing, incontinence upon admission and as needed Assess potential for pressure ulcer upon admission and as needed Treatment Activities: Patient referred for pressure reduction/relief devices : 07/04/2022 Notes: Electronic Signature(s) Signed: 12/12/2022 3:44:30 PM By: Samuella Bruin Entered By: Samuella Bruin on 12/12/2022 14:29:12 -------------------------------------------------------------------------------- Pain Assessment Details Patient Name: Date of Service: Colin [name] [name]. 12/12/2022 2:15 PM Medical Record Number: [id number] Patient Account Number: [id number] Date of  Birth/Sex: Treating RN: [birth year] (67 y.o. [name] Primary Care Regis Hinton: [name] Other Clinician: Referring Stevan Eberwein: Treating Enio Hornback/Extender: [name]: 23 Active Problems Location of Pain Severity and Description of Pain Patient Has Paino No Site Locations Rate the pain. ANTRONE, ZEH [name] ([id number]) [id number] Page 6 of 10 Rate the pain. Current Pain Level: 0 Pain Management and Medication Current Pain Management: Electronic Signature(s) Signed: 12/12/2022 3:44:30 PM By: Samuella Bruin Entered By: Samuella Bruin on 12/12/2022 14:15:35 -------------------------------------------------------------------------------- Patient/Caregiver Education Details Patient Name: Date of Service: Colin [name] [name]. 7/1/2024andnbsp2:15 PM Medical Record Number: [id number] Patient Account Number: [id number] Date of Birth/Gender: Treating RN: 03-11-1956 (67 y.o. [name] Primary Care Physician: [name] Other Clinician: Referring Physician: Treating Physician/Extender: [name] in Treatment: 23 Education Assessment Education Provided To: Patient Education Topics Provided Venous: Methods: Explain/Verbal Responses: Reinforcements needed, State content correctly Electronic Signature(s) Signed: 12/12/2022 3:44:30 PM By: Samuella Bruin Entered By: Samuella Bruin on 12/12/2022 14:29:25 -------------------------------------------------------------------------------- Wound Assessment Details Patient Name: Date of Service: Colin [name] [name]. 12/12/2022 2:15 PM Medical Record Number: [id number] Patient Account Number: [id number] Date of Birth/Sex: Treating RN: 02-Apr-1956 (67 y.o. [name] Primary Care Lannie Heaps: [name] Other Clinician: Referring Maximos Zayas: Treating Kemani Demarais/Extender: Urban, [place] [name] ([id number])  [id number] Page 7 of 10 Weeks in Treatment: 23 Wound Status Wound Number: 2 Primary Diabetic Wound/Ulcer of the Lower Extremity Etiology: Wound Location: Left, Medial Ankle Wound Open Wounding Event: Pressure Injury Status: Date Acquired: 06/13/2022 Comorbid Cataracts, Coronary Artery Disease, Hypertension, Myocardial Weeks Of Treatment: 23 History: Infarction, Peripheral Arterial Disease, Type II Diabetes, Clustered Wound: No  Osteomyelitis, Neuropathy Photos Wound Measurements Length: (cm) 0.7 Width: (cm) 1.5 Depth: (cm) 0.5 Area: (cm) 0.825 Volume: (cm) 0.412 % Reduction in Area: 59.6% % Reduction in Volume: -102% Epithelialization: Medium (34-66%) Tunneling: No Undermining: No Wound Description Classification: Grade 3 Wound Margin: Distinct, outline attached Exudate Amount: Medium Exudate Type: Serosanguineous Exudate Color: red, brown Foul Odor After Cleansing: No Slough/Fibrino Yes Wound Bed Granulation Amount: Large (67-100%) Exposed Structure Granulation Quality: Red, Hyper-granulation Fascia Exposed: No Necrotic Amount: Small (1-33%) Fat Layer (Subcutaneous Tissue) Exposed: Yes Necrotic Quality: Adherent Slough Tendon Exposed: No Muscle Exposed: No Joint Exposed: No Bone Exposed: No Periwound Skin Texture Texture Color No Abnormalities Noted: Yes No Abnormalities Noted: Yes Moisture Temperature / Pain No Abnormalities Noted: No Temperature: No Abnormality Maceration: Yes Treatment Notes Wound #2 (Ankle) Wound Laterality: Left, Medial Cleanser Normal Saline Discharge Instruction: Cleanse the wound with Normal Saline prior to applying [name] clean dressing using gauze sponges, not tissue or cotton balls. Peri-Wound Care Zinc Oxide Ointment 30g tube Discharge Instruction: Apply Zinc Oxide to periwound with each dressing change Sween Lotion (Moisturizing lotion) Discharge Instruction: Apply moisturizing lotion as  directed Topical Gentamicin Discharge Instruction: As directed by physician JOHNPAUL, CIMA [name] ([id number]) [id number] Page 8 of 10 Mupirocin Ointment Discharge Instruction: Apply Mupirocin (Bactroban) as instructed Primary Dressing Hydrofera Blue Ready Transfer Foam, 2.5x2.5 (in/in) Discharge Instruction: Apply directly to wound bed as directed Secondary Dressing Woven Gauze Sponge, Non-Sterile 4x4 in Discharge Instruction: Apply over primary dressing as directed. Zetuvit Plus 4x8 in Discharge Instruction: Apply over primary dressing as directed. Secured With Transpore Surgical Tape, 2x10 (in/yd) Discharge Instruction: Secure dressing with tape as directed. Compression Wrap Urgo K2 Lite, (equivalent to [name] 3 layer) two layer compression system, regular Discharge Instruction: Apply Urgo K2 Lite as directed (alternative to 3 layer compression). Compression Stockings Add-Ons Electronic Signature(s) Signed: 12/12/2022 3:44:30 PM By: Samuella Bruin Entered By: Samuella Bruin on 12/12/2022 14:23:35 -------------------------------------------------------------------------------- Wound Assessment Details Patient Name: Date of Service: [name]. 12/12/2022 2:15 PM Medical Record Number: [id number] Patient Account Number: [id number] Date of Birth/Sex: Treating RN: [birth year] (67 y.o. [name] Primary Care Guerin Lashomb: [name] Other Clinician: Referring Jakwan Sally: Treating Kincade Granberg/Extender: [name]: 23 Wound Status Wound Number: 4 Primary Diabetic Wound/Ulcer of the Lower Extremity Etiology: Wound Location: Left T Fourth oe Wound Open Wounding Event: Skin Tear/Laceration Status: Date Acquired: 12/05/2022 Comorbid Cataracts, Coronary Artery Disease, Hypertension, Myocardial Weeks Of Treatment: 1 History: Infarction, Peripheral Arterial Disease, Type II Diabetes, Clustered Wound: No  Osteomyelitis, Neuropathy Photos Wound Measurements Length: (cm) 0.3 Width: (cm) 0.4 Depth: (cm) 0.1 Area: (cm) 0.094 Volume: (cm) 0.009 Devins, Azir [name] ([id number]) % Reduction in Area: 90% % Reduction in Volume: 90.4% Epithelialization: Large (67-100%) Tunneling: No Undermining: No [id number] Page 9 of 10 Wound Description Classification: Grade 1 Wound Margin: Distinct, outline attached Exudate Amount: None Present Foul Odor After Cleansing: No Slough/Fibrino No Wound Bed Granulation Amount: None Present (0%) Exposed Structure Necrotic Amount: None Present (0%) Fascia Exposed: No Fat Layer (Subcutaneous Tissue) Exposed: No Tendon Exposed: No Muscle Exposed: No Joint Exposed: No Bone Exposed: No Limited to Skin Breakdown Periwound Skin Texture Texture Color No Abnormalities Noted: Yes No Abnormalities Noted: Yes Moisture Temperature / Pain No Abnormalities Noted: Yes Temperature: No Abnormality Treatment Notes Wound #4 (Toe Fourth) Wound Laterality: Left Cleanser Soap and Water Discharge Instruction: May shower and wash wound with dial antibacterial soap and water prior to dressing change. Wound Cleanser Discharge  Instruction: Cleanse the wound with wound cleanser prior to applying [name] clean dressing using gauze sponges, not tissue or cotton balls. Peri-Wound Care Topical Primary Dressing Maxorb Extra Ag+ Alginate Dressing, 2x2 (in/in) Discharge Instruction: Apply to wound bed as instructed Secondary Dressing Woven Gauze Sponges 2x2 in Discharge Instruction: Apply over primary dressing as directed. Secured With Conforming Stretch Gauze Bandage Roll, Sterile 4x75 (in/in) Discharge Instruction: Secure with stretch gauze as directed. Compression Wrap Compression Stockings Add-Ons Electronic Signature(s) Signed: 12/12/2022 3:44:30 PM By: Samuella Bruin Entered By: Samuella Bruin on 12/12/2022  14:23:56 -------------------------------------------------------------------------------- Vitals Details Patient Name: Date of Service: Colin [name]. 12/12/2022 2:15 PM Medical Record Number: [id number] Patient Account Number: [id number] Date of Birth/Sex: Treating RN: 07-Jun-1956 (67 y.o. [name] Primary Care Sabra Sessler: [name] Other Clinician: Referring Galilee Pierron: Treating [place] Bowlds/Extender: [name] in Treatment: [address] Excell [name] ([id number]) [id number] Page 10 of 10 Vital Signs Time Taken: 14:15 Temperature (F): 97.9 Height (in): 74 Pulse (bpm): 80 Weight (lbs): 187 Respiratory Rate (breaths/min): 16 Body Mass Index (BMI): 24 Blood Pressure (mmHg): 121/74 Capillary Blood Glucose (mg/dl): 782 Reference Range: 80 - 120 mg / dl Electronic Signature(s) Signed: 12/12/2022 3:44:30 PM By: Samuella Bruin Entered By: Samuella Bruin on 12/12/2022 14:16:01

## 2022-12-16 ENCOUNTER — Other Ambulatory Visit: Payer: Self-pay | Admitting: *Deleted

## 2022-12-16 DIAGNOSIS — I739 Peripheral vascular disease, unspecified: Secondary | ICD-10-CM

## 2022-12-19 ENCOUNTER — Encounter (HOSPITAL_BASED_OUTPATIENT_CLINIC_OR_DEPARTMENT_OTHER): Payer: BC Managed Care – PPO | Admitting: General Surgery

## 2022-12-19 DIAGNOSIS — E11621 Type 2 diabetes mellitus with foot ulcer: Secondary | ICD-10-CM | POA: Diagnosis not present

## 2022-12-19 DIAGNOSIS — Z8249 Family history of ischemic heart disease and other diseases of the circulatory system: Secondary | ICD-10-CM | POA: Diagnosis not present

## 2022-12-19 DIAGNOSIS — Z7984 Long term (current) use of oral hypoglycemic drugs: Secondary | ICD-10-CM | POA: Diagnosis not present

## 2022-12-19 DIAGNOSIS — L97322 Non-pressure chronic ulcer of left ankle with fat layer exposed: Secondary | ICD-10-CM | POA: Diagnosis not present

## 2022-12-19 DIAGNOSIS — Z951 Presence of aortocoronary bypass graft: Secondary | ICD-10-CM | POA: Diagnosis not present

## 2022-12-19 DIAGNOSIS — E11622 Type 2 diabetes mellitus with other skin ulcer: Secondary | ICD-10-CM | POA: Diagnosis not present

## 2022-12-19 DIAGNOSIS — L97522 Non-pressure chronic ulcer of other part of left foot with fat layer exposed: Secondary | ICD-10-CM | POA: Diagnosis not present

## 2022-12-19 DIAGNOSIS — E1151 Type 2 diabetes mellitus with diabetic peripheral angiopathy without gangrene: Secondary | ICD-10-CM | POA: Diagnosis not present

## 2022-12-19 DIAGNOSIS — I2510 Atherosclerotic heart disease of native coronary artery without angina pectoris: Secondary | ICD-10-CM | POA: Diagnosis not present

## 2022-12-19 DIAGNOSIS — Z794 Long term (current) use of insulin: Secondary | ICD-10-CM | POA: Diagnosis not present

## 2022-12-19 DIAGNOSIS — Z833 Family history of diabetes mellitus: Secondary | ICD-10-CM | POA: Diagnosis not present

## 2022-12-19 NOTE — Progress Notes (Signed)
[name], [name] [id number] Page 1 of 8 Visit Report for 12/19/2022 Arrival Information Details Patient Name: Date of Service: NA [name]. 12/19/2022 3:00 PM Medical Record Number: [id number] Patient Account Number: [id number] Date of Birth/Sex: Treating RN: 06-23-1955 (67 y.o. [name] Primary Care Elan Mcelvain: [name] Other Clinician: Referring Hashim Eichhorst: Treating Nikaela Coyne/Extender: [name] in Treatment: 24 Visit Information History Since Last Visit Added or deleted any medications: No Patient Arrived: Knee Scooter Any new allergies or adverse reactions: No Arrival Time: 15:01 Had [name] fall or experienced change in No Accompanied By: self activities of daily living that may affect Transfer Assistance: None risk of falls: Patient Identification Verified: Yes Signs or symptoms of abuse/neglect since last visito No Secondary Verification Process Completed: Yes Hospitalized since last visit: No Patient Requires Transmission-Based Precautions: No Implantable device outside of the clinic excluding No Patient Has Alerts: No cellular tissue based products placed in the center since last visit: Has Dressing in Place as Prescribed: Yes Has Compression in Place as Prescribed: Yes Pain Present Now: No Electronic Signature(s) Signed: 12/19/2022 4:26:55 PM By: Samuella Bruin Entered By: Samuella Bruin on 12/19/2022 15:04:52 -------------------------------------------------------------------------------- Compression Therapy Details Patient Name: Date of Service: NA [name]. 12/19/2022 3:00 PM Medical Record Number: [id number] Patient Account Number: [id number] Date of Birth/Sex: Treating RN: Mar 31, 1956 (67 y.o. [name] Primary Care Teighlor Korson: [name] Other Clinician: Referring Aj Crunkleton: Treating Ailynn Gow/Extender: [name]: 24 Compression  Therapy Performed for Wound Assessment: Wound #2 Left,Medial Ankle Performed By: Clinician Samuella Bruin, RN Compression Type: Double Layer Post Procedure Diagnosis Same as Pre-procedure Notes scribed for Dr. Lady Gary by Samuella Bruin, RN Electronic Signature(s) Signed: 12/19/2022 4:26:55 PM By: Gelene Mink By: Samuella Bruin on 12/19/2022 15:22:26 [name] ([id number]) [id number] Page 2 of 8 -------------------------------------------------------------------------------- Encounter Discharge Information Details Patient Name: Date of Service: NA [name]. 12/19/2022 3:00 PM Medical Record Number: [id number] Patient Account Number: [id number] Date of Birth/Sex: Treating RN: May 25, 1956 (67 y.o. [name] Primary Care Jackie Russman: [name] Other Clinician: Referring Villa Burgin: Treating Mellisa Arshad/Extender: [name] in Treatment: 24 Encounter Discharge Information Items Post Procedure Vitals Discharge Condition: Stable Temperature (F): 97.8 Ambulatory Status: Knee Scooter Pulse (bpm): 76 Discharge Destination: Home Respiratory Rate (breaths/min): 18 Transportation: Private Auto Blood Pressure (mmHg): 119/70 Accompanied By: self Schedule Follow-up Appointment: Yes Clinical Summary of Care: Patient Declined Electronic Signature(s) Signed: 12/19/2022 4:26:55 PM By: Samuella Bruin Entered By: Samuella Bruin on 12/19/2022 15:38:44 -------------------------------------------------------------------------------- Lower Extremity Assessment Details Patient Name: Date of Service: NA [name]. 12/19/2022 3:00 PM Medical Record Number: [id number] Patient Account Number: [id number] Date of Birth/Sex: Treating RN: Aug 19, 1955 (67 y.o. [name] Primary Care Daniesha Driver: [name] Other Clinician: Referring Raiford Fetterman: Treating Taro Hidrogo/Extender: [name]: 24 Edema Assessment Assessed: [Left: No] [Right: No] Edema: [Left: Ye] [Right: s] Calf Left: Right: Point of Measurement: From Medial Instep 32 cm Ankle Left: Right: Point of Measurement: From Medial Instep 22.8 cm Vascular Assessment Pulses: Dorsalis Pedis Palpable: [Left:Yes] Electronic Signature(s) Signed: 12/19/2022 4:26:55 PM By: Samuella Bruin Entered By: Samuella Bruin on 12/19/2022 15:09:48 [name] ([id number]) [id number] Page 3 of 8 -------------------------------------------------------------------------------- Multi Wound Chart Details Patient Name: Date of Service: NA [name]. 12/19/2022 3:00 PM Medical Record Number: [id number] Patient Account Number: [id number] Date of Birth/Sex: Treating RN: [birth year] (67 y.o. M)  Primary Care Yusef Lamp: [name] Other Clinician: Referring Jolyne Laye: Treating Inaaya Vellucci/Extender: [name]: 24 Vital Signs Height(in): 74 Capillary Blood Glucose(mg/dl): 87 Weight(lbs): 161 Pulse(bpm): 76 Body Mass Index(BMI): 24 Blood Pressure(mmHg): 119/70 Temperature(F): 97.8 Respiratory Rate(breaths/min): 18 [2:Photos:] [N/[name]:N/[name]] Left, Medial Ankle Left T Fourth oe N/[name] Wound Location: Pressure Injury Skin T ear/Laceration N/[name] Wounding Event: Diabetic Wound/Ulcer of the Lower Diabetic Wound/Ulcer of the Lower N/[name] Primary Etiology: Extremity Extremity Cataracts, Coronary Artery Disease, Cataracts, Coronary Artery Disease, N/[name] Comorbid History: Hypertension, Myocardial Infarction, Hypertension, Myocardial Infarction, Peripheral Arterial Disease, Type II Peripheral Arterial Disease, Type II Diabetes, Osteomyelitis, Neuropathy Diabetes, Osteomyelitis, Neuropathy 06/13/2022 12/05/2022 N/[name] Date Acquired: 24 2 N/[name] Weeks of Treatment: Open Open N/[name] Wound Status: No No N/[name] Wound Recurrence: 0.3x0.7x0.4 0x0x0 N/[name] Measurements L x  W x D (cm) 0.165 0 N/[name] [name] (cm) : rea 0.066 0 N/[name] Volume (cm) : 91.90% 100.00% N/[name] % Reduction in [name] rea: 67.60% 100.00% N/[name] % Reduction in Volume: Grade 3 Grade 1 N/[name] Classification: Medium None Present N/[name] Exudate [name] mount: Serosanguineous N/[name]/[name] Exudate Type: red, brown N/[name]/[name] Exudate Color: Distinct, outline attached Distinct, outline attached N/[name] Wound Margin: Large (67-100%) None Present (0%) N/[name] Granulation [name] mount: Red N/[name]/[name] Granulation Quality: Small (1-33%) None Present (0%) N/[name] Necrotic [name] mount: Fat Layer (Subcutaneous Tissue): Yes Fascia: No N/[name] Exposed Structures: Fascia: No Fat Layer (Subcutaneous Tissue): No Tendon: No Tendon: No Muscle: No Muscle: No Joint: No Joint: No Bone: No Bone: No Medium (34-66%) Large (67-100%) N/[name] Epithelialization: Debridement - Selective/Open Wound N/[name]/[name] Debridement: Pre-procedure Verification/Time Out 15:20 N/[name]/[name] Taken: Lidocaine 4% Topical Solution N/[name]/[name] Pain Control: Slough N/[name]/[name] Tissue Debrided: Non-Viable Tissue N/[name]/[name] Level: 0.16 N/[name]/[name] Debridement [name] (sq cm): rea Curette N/[name]/[name] Instrument: Minimum N/[name]/[name] Bleeding: Pressure N/[name]/[name] Hemostasis [name] chieved: Procedure was tolerated well N/[name]/[name] Debridement Treatment Response: 0.3x0.7x0.4 N/[name]/[name] Post Debridement Measurements L x W x D (cm) 0.066 N/[name]/[name] Post Debridement Volume: (cm) No Abnormalities Noted No Abnormalities Noted N/[name] Periwound Skin TextureERIE, HECKMAN [name] ([id number]) [id number] Page 4 of 8 Maceration: Yes No Abnormalities Noted N/[name] Periwound Skin Moisture: No Abnormalities Noted No Abnormalities Noted N/[name] Periwound Skin Color: No Abnormality No Abnormality N/[name] Temperature: Compression Therapy N/[name]/[name] Procedures Performed: Debridement Treatment Notes Electronic Signature(s) Signed: 12/19/2022 3:33:37 PM By: [name] MD FACS Entered By: [name] on 12/19/2022  15:33:37 -------------------------------------------------------------------------------- Multi-Disciplinary Care Plan Details Patient Name: Date of Service: NA [name]. 12/19/2022 3:00 PM Medical Record Number: [id number] Patient Account Number: [id number] Date of Birth/Sex: Treating RN: [birth year] (67 y.o. [name] Primary Care Chesky Heyer: [name] Other Clinician: Referring Lucina Betty: Treating Teagon Kron/Extender: [name] in Treatment: 24 Multidisciplinary Care Plan reviewed with physician Active Inactive Pressure Nursing Diagnoses: Knowledge deficit related to causes and risk factors for pressure ulcer development Knowledge deficit related to management of pressures ulcers Goals: Patient will remain free of pressure ulcers Date Initiated: 07/04/2022 Target Resolution Date: 01/13/2023 Goal Status: Active Interventions: Assess: immobility, friction, shearing, incontinence upon admission and as needed Assess potential for pressure ulcer upon admission and as needed Treatment Activities: Patient referred for pressure reduction/relief devices : 07/04/2022 Notes: Electronic Signature(s) Signed: 12/19/2022 4:26:55 PM By: Samuella Bruin Entered By: Samuella Bruin on 12/19/2022 15:37:51 -------------------------------------------------------------------------------- Pain Assessment Details Patient Name: Date of Service: NA [name]. 12/19/2022 3:00 PM Medical Record Number: [id number] Patient Account Number: [id number]  Date of Birth/Sex: Treating RN: 1955-11-11 (67 y.o. [name] Primary Care Eshani Maestre: [name] Other Clinician: Referring Marvis Saefong: Treating [name] [name]/Extender: [name] in Treatment: [address] [name] [id number] Page 5 of 8 Active Problems Location of Pain Severity and Description of Pain Patient Has Paino No Site Locations Rate the  pain. Current Pain Level: 0 Pain Management and Medication Current Pain Management: Electronic Signature(s) Signed: 12/19/2022 4:26:55 PM By: Samuella Bruin Entered By: Samuella Bruin on 12/19/2022 15:05:16 -------------------------------------------------------------------------------- Patient/Caregiver Education Details Patient Name: Date of Service: NA [name] 7/8/2024andnbsp3:00 PM Medical Record Number: [id number] Patient Account Number: [id number] Date of Birth/Gender: Treating RN: 02-26-1956 (67 y.o. [name] Primary Care Physician: [name] Other Clinician: Referring Physician: Treating Physician/Extender: [name] in Treatment: 24 Education Assessment Education Provided To: Patient Education Topics Provided Wound/Skin Impairment: Methods: Explain/Verbal Responses: Reinforcements needed, State content correctly Electronic Signature(s) Signed: 12/19/2022 4:26:55 PM By: Samuella Bruin Entered By: Samuella Bruin on 12/19/2022 15:38:03 [name] ([id number]) [id number] Page 6 of 8 -------------------------------------------------------------------------------- Wound Assessment Details Patient Name: Date of Service: NA [name]. 12/19/2022 3:00 PM Medical Record Number: [id number] Patient Account Number: [id number] Date of Birth/Sex: Treating RN: 01/22/1956 (67 y.o. [name] Primary Care Luvern Mischke: [name] Other Clinician: Referring Winfred Iiams: Treating Anden Bartolo/Extender: [name]: 24 Wound Status Wound Number: 2 Primary Diabetic Wound/Ulcer of the Lower Extremity Etiology: Wound Location: Left, Medial Ankle Wound Open Wounding Event: Pressure Injury Status: Date Acquired: 06/13/2022 Comorbid Cataracts, Coronary Artery Disease, Hypertension, Myocardial Weeks Of Treatment: 24 History: Infarction, Peripheral Arterial  Disease, Type II Diabetes, Clustered Wound: No Osteomyelitis, Neuropathy Photos Wound Measurements Length: (cm) 0.3 Width: (cm) 0.7 Depth: (cm) 0.4 Area: (cm) 0.165 Volume: (cm) 0.066 % Reduction in Area: 91.9% % Reduction in Volume: 67.6% Epithelialization: Medium (34-66%) Tunneling: No Undermining: No Wound Description Classification: Grade 3 Wound Margin: Distinct, outline attached Exudate Amount: Medium Exudate Type: Serosanguineous Exudate Color: red, brown Foul Odor After Cleansing: No Slough/Fibrino Yes Wound Bed Granulation Amount: Large (67-100%) Exposed Structure Granulation Quality: Red Fascia Exposed: No Necrotic Amount: Small (1-33%) Fat Layer (Subcutaneous Tissue) Exposed: Yes Necrotic Quality: Adherent Slough Tendon Exposed: No Muscle Exposed: No Joint Exposed: No Bone Exposed: No Periwound Skin Texture Texture Color No Abnormalities Noted: Yes No Abnormalities Noted: Yes Moisture Temperature / Pain No Abnormalities Noted: No Temperature: No Abnormality Maceration: Yes Treatment Notes Wound #2 (Ankle) Wound Laterality: Left, Medial Cleanser Normal Saline Discharge Instruction: Cleanse the wound with Normal Saline prior to applying [name] clean dressing using gauze sponges, not tissue or cotton balls. Peri-Wound Care Zinc Oxide Ointment 30g tube Discharge Instruction: Apply Zinc Oxide to periwound with each dressing change ELISON, ASWAD [name] ([id number]) [phone].pdf Page 7 of 8 Sween Lotion (Moisturizing lotion) Discharge Instruction: Apply moisturizing lotion as directed Topical Gentamicin Discharge Instruction: As directed by physician Mupirocin Ointment Discharge Instruction: Apply Mupirocin (Bactroban) as instructed Primary Dressing Promogran Prisma Matrix, 4.34 (sq in) (silver collagen) Discharge Instruction: Moisten collagen with saline or hydrogel Secondary Dressing Woven Gauze Sponge, Non-Sterile 4x4 in Discharge  Instruction: Apply over primary dressing as directed. Secured With Transpore Surgical Tape, 2x10 (in/yd) Discharge Instruction: Secure dressing with tape as directed. Compression Wrap Urgo K2 Lite, (equivalent to [name] 3 layer) two layer compression system, regular Discharge Instruction: Apply Urgo K2 Lite as directed (alternative to 3 layer compression). Compression Stockings Add-Ons Electronic Signature(s) Signed: 12/19/2022 4:26:55 PM By: Ander Slade,  Ladona Ridgel Entered By: Samuella Bruin on 12/19/2022 15:16:45 -------------------------------------------------------------------------------- Wound Assessment Details Patient Name: Date of Service: [name]. 12/19/2022 3:00 PM Medical Record Number: [id number] Patient Account Number: [id number] Date of Birth/Sex: Treating RN: 02/05/1956 (67 y.o. [name] Primary Care Rubina Basinski: [name] Other Clinician: Referring Tyrese Ficek: Treating Terryn Rosenkranz/Extender: [name]: 24 Wound Status Wound Number: 4 Primary Diabetic Wound/Ulcer of the Lower Extremity Etiology: Wound Location: Left T Fourth oe Wound Open Wounding Event: Skin Tear/Laceration Status: Date Acquired: 12/05/2022 Comorbid Cataracts, Coronary Artery Disease, Hypertension, Myocardial Weeks Of Treatment: 2 History: Infarction, Peripheral Arterial Disease, Type II Diabetes, Clustered Wound: No Osteomyelitis, Neuropathy Photos Wound Measurements Length: (cm) 0 Moustafa, Atha [name] ([id number]) Width: (cm) 0 Depth: (cm) 0 Area: (cm) 0 Volume: (cm) 0 % Reduction in Area: 100% [phone].pdf Page 8 of 8 % Reduction in Volume: 100% Epithelialization: Large (67-100%) Tunneling: No Undermining: No Wound Description Classification: Grade 1 Wound Margin: Distinct, outline attached Exudate Amount: None Present Foul Odor After Cleansing: No Slough/Fibrino No Wound Bed Granulation Amount: None Present  (0%) Exposed Structure Necrotic Amount: None Present (0%) Fascia Exposed: No Fat Layer (Subcutaneous Tissue) Exposed: No Tendon Exposed: No Muscle Exposed: No Joint Exposed: No Bone Exposed: No Periwound Skin Texture Texture Color No Abnormalities Noted: Yes No Abnormalities Noted: Yes Moisture Temperature / Pain No Abnormalities Noted: Yes Temperature: No Abnormality Electronic Signature(s) Signed: 12/19/2022 4:26:55 PM By: Samuella Bruin Entered By: Samuella Bruin on 12/19/2022 15:17:32 -------------------------------------------------------------------------------- Vitals Details Patient Name: Date of Service: NA [name]. 12/19/2022 3:00 PM Medical Record Number: [id number] Patient Account Number: [id number] Date of Birth/Sex: Treating RN: 29-Apr-1956 (67 y.o. [name] Primary Care Edwin Cherian: [name] Other Clinician: Referring Nena Hampe: Treating Rielle Schlauch/Extender: [name]: 24 Vital Signs Time Taken: 15:04 Temperature (F): 97.8 Height (in): 74 Pulse (bpm): 76 Weight (lbs): 187 Respiratory Rate (breaths/min): 18 Body Mass Index (BMI): 24 Blood Pressure (mmHg): 119/70 Capillary Blood Glucose (mg/dl): 87 Reference Range: 80 - 120 mg / dl Electronic Signature(s) Signed: 12/19/2022 4:26:55 PM By: Samuella Bruin Entered By: Samuella Bruin on 12/19/2022 15:05:09

## 2022-12-19 NOTE — Progress Notes (Signed)
[name], GAERTNER [name] [id number] Page 1 of 12 Visit Report for 12/19/2022 Chief Complaint Document Details Patient Name: Date of Service: NA Colin [name]. 12/19/2022 3:00 PM Medical Record Number: [id number] Patient Account Number: [id number] Date of Birth/Sex: Treating RN: [birth year] (67 y.o. M) Primary Care Provider: [name] Other Clinician: Referring Provider: Treating Provider/Extender: [name] in Treatment: 24 Information Obtained from: Patient Chief Complaint Patients presents for treatment of an open diabetic ulcer with exposed bone and osteomyelitis 07/04/2022: Diabetic ankle ulcer Electronic Signature(s) Signed: 12/19/2022 3:34:34 PM By: [name] MD FACS Entered By: [name] on 12/19/2022 15:34:34 -------------------------------------------------------------------------------- Debridement Details Patient Name: Date of Service: NA Colin [name]. 12/19/2022 3:00 PM Medical Record Number: [id number] Patient Account Number: [id number] Date of Birth/Sex: Treating RN: 07-09-1955 (67 y.o. Colin [name] Primary Care Provider: [name] Other Clinician: Referring Provider: Treating Provider/Extender: [name]: 24 Debridement Performed for Assessment: Wound #2 Left,Medial Ankle Performed By: Physician [name], MD Debridement Type: Debridement Severity of Tissue Pre Debridement: Fat layer exposed Level of Consciousness (Pre-procedure): Awake and Alert Pre-procedure Verification/Time Out Yes - 15:20 Taken: Start Time: 15:20 Pain Control: Lidocaine 4% T opical Solution Percent of Wound Bed Debrided: 100% T Area Debrided (cm): otal 0.16 Tissue and other material debrided: Non-Viable, Slough, Slough Level: Non-Viable Tissue Debridement Description: Selective/Open Wound Instrument: Curette Bleeding: Minimum Hemostasis Achieved: Pressure Response  to Treatment: Procedure was tolerated well Level of Consciousness (Post- Awake and Alert procedure): Post Debridement Measurements of Total Wound Length: (cm) 0.3 Width: (cm) 0.7 Depth: (cm) 0.4 Volume: (cm) 0.066 Character of Wound/Ulcer Post Debridement: Improved Severity of Tissue Post Debridement: Fat layer exposed Colin [name] ([id number]) [id number] Page 2 of 12 Post Procedure Diagnosis Same as Pre-procedure Notes scribed for Dr. Lady Gary by Samuella Bruin, RN Electronic Signature(s) Signed: 12/19/2022 3:47:08 PM By: [name] MD FACS Signed: 12/19/2022 4:26:55 PM By: Samuella Bruin Entered By: Samuella Bruin on 12/19/2022 15:21:59 -------------------------------------------------------------------------------- HPI Details Patient Name: Date of Service: NA Colin [name], Colin [name]. 12/19/2022 3:00 PM Medical Record Number: [id number] Patient Account Number: [id number] Date of Birth/Sex: Treating RN: 12/12/1955 (67 y.o. M) Primary Care Provider: [name] Other Clinician: Referring Provider: Treating Provider/Extender: [name] in Treatment: 24 History of Present Illness HPI Description: ADMISSION 08/25/2021 This is [name] 67 year old man who initially presented to his primary care provider in September 2022 with pain in his left foot. He was sent for an x-ray and while the x-ray was being performed, the tech pointed out [name] wound on his foot that the patient was not aware existed. He does have type 2 diabetes with significant neuropathy. His diabetes is suboptimally controlled with his most recent A1c being 8.5. He also has [name] history of coronary artery disease status post three- vessel CABG. he was initially seen by orthopedics, but they referred him to [facility]. He has undergone at least 7 operations/debridements and several applications of skin substitute under the care of podiatry. He has been in [name]  wound VAC for much of this time. His most recent procedure was July 28, 2021. [name] was biopsied and was found to be consistent with osteomyelitis. Culture also returned positive for corynebacterium. He was seen on August 16, 2021 by infectious disease. [name] PICC line has been placed and he will be receiving [name] 6-week course of IV daptomycin and cefepime. In October 2022, he  underwent lower extremity vascular studies. Results are copied here: Right: Resting right ankle-brachial index is within normal range. No evidence of significant right lower extremity arterial disease. The right toe-brachial index is abnormal. Left: Resting left ankle-brachial index indicates mild left lower extremity arterial disease. The left toe-brachial index is abnormal. He has not been seen by vascular surgery despite these findings. He presented to clinic today in [name] and is using [name] to offload. Wound VAC was in place. Once this was removed, [name] large ulcer was identified on the left midfoot/ankle. Bone is frankly exposed. There is no malodorous or purulent drainage. There is some granulation tissue over the central portion of the exposed bone. There is [name] tunnel that extends posteriorly for roughly 10 cm. It has been discussed with him by multiple providers that he is at very high risk of losing his lower leg because of this wound. He is extremely eager to avoid this outcome and is here today to review his options as well as receive ongoing wound care. 09/03/2021: Here for reevaluation of his wound. There does not appear to have been any substantial improvement overall since our last visit. He has been in [name] with white foam overlying the exposed bone. We are working on getting him approved for hyperbaric oxygen therapy. 09/10/2021: We are in the process of getting him cleared to begin hyperbaric oxygen therapy. He still needs to obtain [name] chest x-ray. Although the wound measurements  are roughly the same, I think the overall appearance of the wound is better. The exposed bone has [name] bit more granulation tissue covering it. He has not received [name] vascular surgery appointment to reevaluate his flow to the wound. 09/17/2021: He has been approved for hyperbaric oxygen therapy and completed his chest x-ray, which I reviewed and it appears normal. The tunnels at the 12 and 10:00 positions are smaller. There is more granulation tissue covering the exposed bone and the undermining has decreased. He still has not received [name] vascular surgery appointment. 09/24/2021: He initiated hyperbaric oxygen therapy this week and is tolerating it well. He has an appointment with vascular surgery coming up on May 16. The granulation tissue is covering more of the exposed bone and both tunnels are [name] bit smaller. 10/01/2021: He continues to tolerate hyperbaric oxygen therapy. He saw infectious disease and they are planning to pull his PICC line. He has been initiated on oral antibiotics (doxycycline and Augmentin). The wound looks about the same but the tunnels are [name] little bit smaller. The skin seems to be contracting somewhat around the exposed bone. 10/08/2021: The wound is still about the same size, but the tunnels continue to come in and the skin is contracting around the exposed bone. He continues to have some accumulation of necrotic material in the inferoposterior aspect of the wound as well as accumulation at the 12:00 tunnel area. 10/15/2021: The wound is smaller today. The tunnels continue to come in. There is less necrotic tissue present. He does have some periwound maceration. 10/22/2021: The wound is about the same size. There is [name] little bit less undermining at the distal portion. The exposed bone is dark and I am not sure if this is staining from silver nitrate or his VAC sponge or if it represents necrosis. The tunnels are shallower but he does have some serous drainage coming from the [facility] [name] [phone].pdf Page 3 of 12 10:00 tunnel. He continues to tolerate hyperbaric oxygen therapy well.  10/29/2021: The undermining continues to improve. The tunnels are about the same. He has good granulation tissue overlying the majority of the exposed bone. It does appear that perhaps the tubing from his wound VAC has been eroding the skin at the 12 clock position. He continues to accumulate senescent epithelium around the borders of the wound. 11/05/2021: The undermining is almost completely resolved. The tunnels have contracted fairly significantly. No significant slough or debris accumulation. There is still senescent epithelium accumulation around the borders of the wound. He has been tolerating hyperbaric oxygen therapy well. 11/12/2021: Despite the measurements of the wound being about the same, the wound has changed in its shape and overall, I think it is improved. The undermining has resolved and the tunnels continue to shorten. There is good granulation tissue encroaching over the small area of bone that has remained exposed at the 12 o'clock position. Minimal slough accumulation. He continues to tolerate hyperbaric oxygen therapy well. 11/19/2021: I took [name] PCR culture last week. There was overgrowth of yeast. He is already taking suppressive doxycycline and Augmentin. I added fluconazole to his regimen. The wound is smaller again today. The tunnels continue to shorten. He continues to do well with hyperbaric oxygen therapy. 11/26/2021: For some reason, his foot has become macerated. The wound is narrower but about the same dimensions in its longitudinal aspect. The tunnels continue to shorten. He has some slough buildup on the wound as well as some heaped up senescent epithelium around the perimeter. 12/03/2021: No further maceration of his foot has occurred. The wound has contracted quite significantly from last week. The tunnel at 10:00 is closed.  The tunnel at 12:00 is down to just [name] couple of millimeters. No other undermining is present. There is soft tissue coverage of the previously exposed bone. There is just [name] bit of slough and biofilm on the wound surface. 12/10/2021: The wound is looking good. It turns out the tunnel at 12:00 is only exposed when the patient dorsiflexes his foot. It is about 2 cm in depth when he does this; when his foot is in plantarflexion, the tunnel is closed. The bone that was visible at the 12:00 tunnel is completely covered with granulation tissue, but there does feel like some exposed bone deeper into the tunnel area. There is senescent skin heaped up around the periphery. Minimal slough on the wound surface. 12/16/2021: The wound dimensions are roughly the same. The surface has nice granulation tissue. The exposed bone at the 12:00 tunnel continues to be covered with more soft tissue. 7/14; patient's wound measures smaller today. Using the wound VAC with underlying collagen. He is also being treated with HBO for underlying osteomyelitis. He tells me he is on doxycycline and ampicillin follows with infectious disease next week 12/31/2021: The wound continues to contract. Unfortunately, the area where the track pad and tubing have been rubbing continues to look like it is applying friction. He says that the home health nurses that have been applying the [facility] have been putting gauze underneath the tubing, but nonetheless there is ongoing tissue breakdown at this site. Light slough accumulation on the wound surface. The tunnel continues to contract. He is tolerating HBO without difficulty. 01/07/2022: Bridging the wound VAC away from the ankle has resulted in significant improvement in the tissue at the apex of the wound. The tunnel is still present and is not all that much shorter, but the overall wound surface is very robust and healthy looking. Minimal slough accumulation. No concern for  acute infection. 01/21/2022:  The wound continues to contract and has [name] robust granulation tissue surface. The tunnel has come in considerably and is down to about 1.4 cm. There is still bone exposed within the tunnel but the rest of it is well covered. There is some senescent epithelium at the wound margins and [name] little bit of slough on the surface. 01/28/2022: No significant change in the wound this week, but there has not been any reaccumulation of senescent epithelium or slough. The tunnel is perhaps [name] millimeter less in depth. He has been approved for Apligraf and we will apply this today. 02/04/2022: The wound has contracted somewhat and the tunnel has filled in completely. The wound surface is clean. He is here for Apligraf #2. 02/11/2022: The wound has contracted further and is now nearly flush with the surrounding skin surface. Light layer of slough. Apligraf #3 plan for today. 02/18/2022: There is [name] band of epithelium trying to cut across the superior portion of the wound. There is robust granulation tissue with just [name] light layer of slough and biofilm on the surface. There was [name] little bit of greenish drainage in the wound VAC but none appreciated on the site itself. He is here for Apligraf #4. 03/04/2022: The wound has contracted considerably. There is good granulation tissue on the surface. Minimal biofilm. He is here for Apligraf #5. 03/18/2022: The wound has epithelialized to the point that it has been divided into 2 areas. Both areas are smaller in total than at his previous visit. There is good granulation tissue present with just [name] little bit of periwound eschar and surface slough. 10/13; left medial foot. The patient has completed treatment with hyperbaric oxygen for underlying osteomyelitis he has had [name] nice response in the wound. Noted today that he still had some greenish drainage. Previously he has been treated with topical gentamicin but apparently that was stopped 2 weeks ago. Fortunately the wound is measuring  smaller 10/20; wound looks better no hypergranulation minimal drainage. Granulation tissue looks healthy using gentamicin Hydrofera Blue under compression 04/08/2022: The wound continues to contract tremendously. The response to his treatment for hypertrophic granulation tissue has been quite remarkable. There are just [name] couple of open areas remaining. 04/18/2022: His wound is healed. READMISSION 07/04/2022: He returns to clinic with [name] new ulcer adjacent to where his previous large wound had been. He says that he was wearing boots and using [name] small padded dressing to protect the freshly healed ankle wound. Apparently the dressing rolled up and rubbed on his skin, causing [name] new wound. He contacted our office last week concerned about the appearance. There is [name] small oval wound on his left medial ankle. It is fairly clean with some periwound dry skin and eschar. 07/11/2022: The wound is about the same size this week. There is [name] layer of rubbery slough on the surface. It has [name] fairly strong odor. 07/18/2022: The wound measured larger this week due to small satellite areas opening in [name] fan pattern distal to the primary wound. He continues to accumulate slough on the wound surface. The culture that I took last week only grew out low levels of skin flora. 07/25/2022: The satellite areas that had opened last week have closed. The main wound is smaller with some slough accumulation. 08/01/2022: The wound continues to contract. There is [name] layer of slough on the surface. 08/08/2022: The wound is [name] little bit smaller again this week. Still with slough buildup. 3/18; patient presents for follow-up. He  has been doing dressing changes on his own with silver alginate and antibiotic ointment. He has been at [employer] over the past 3 weeks. He has developed [name] new wound. Colin [name], Colin [name] ([id number]) [id number] Page 4 of 12 09/05/2022: I am not sure if the new wound that was reported last week  is just the satellite area that had closed [name] couple of weeks ago and reopened. Regardless, both wounds are quite small with good epithelialization. There is some eschar buildup around the periphery of both wounds with slough on both wound surfaces. 09/12/2022: The wounds have converged. There is some increased in depth at the distal portion. This seems to be secondary to moisture accumulation as the periwound skin is [name] bit macerated. 09/26/2022: The wound has deteriorated further. There is increased depth at the distal portion and the wound probes to bone. The patient theorizes that the boot he has been wearing is rubbing in this spot and upon inspection of the boot, there is actually [name] hard plastic boot in exactly this location, supporting his theory. 10/05/2022: The wound looks [name] little bit better this week. The area that probed to bone now has some tissue coverage. There is still some slough buildup on the wound surfaces. 10/10/2022: The wound is strongly malodorous today. There is no purulent drainage, nor any increased erythema. There is some tissue maceration around the wound, but he did not have [name] whole lot of drainage on his dressings. There is slough on the wound surface. 10/17/2022: The wound still has [name] fairly strong odor and the drainage has increased. It is goopy and purulent. The culture that I took last week returned late on Thursday with methicillin and tetracycline resistant staph epidermidis and multiple other staph species at fairly high concentrations. 10/27/2022: The wound has cleaned up considerably. There is no odor and less drainage. The drainage is no longer purulent. He is taking Bactrim as prescribed. 11/03/2022: He completed his course of Bactrim however the wound has deteriorated, has [name] foul odor, and there are fragments of bone breaking off in his wound. His drainage was dark and purulent. 11/08/2022: As expected, the bone biopsy returned with osteomyelitis and osteonecrosis. His  culture returned with [name] number of species with the Levaquin I had already prescribed being along with the first-line choice as suggested by the pharmacist. Notably, however, Pseudomonas aeruginosa was not among the species cultivated. He has an appointment to see Dr. Daiva Eves on Wednesday the 19th; he has vascular follow-up on Thursday the sixth. The wound actually looks [name] little bit better today. There are no chunks of bone falling out, nor am I actually able to probe or palpate bone. He did have [name] bit more drainage, but this was noted as murky or purulent as at his previous visit. There is slough buildup on the wound surface and the granulation tissue present is [name] little bit hypertrophic and friable. 11/15/2022: The wound actually looks [name] little bit better today. I can still palpate bone with his foot in [name] relaxed, plantarflexed position, but there does seem to be some tissue covering it. The drainage has improved. He has completed the oral levofloxacin that I prescribed. He is going to see vascular surgery on Thursday and has an appointment with infectious disease on Wednesday the 19th. 11/21/2022: There is just [name] tiny little spike of bone that is still palpable. Everything else looks improved. He is having an angiogram next Friday to see if he can be optimized from an arterial inflow  perspective. 11/28/2022: The bone is completely covered with soft tissue. The wound is smaller. 12/05/2022: He had his angiogram last week and 3 vessels in his lower leg was stented. The ankle wound is smaller again today with minimal slough accumulation. He managed to tear his toenail off of his fourth toe today when he was removing his socks. This has left [name] wound with the nailbed/fat layer exposed. 12/12/2022: The wound was measured the same size with [name] little bit more depth to it today, but visual inspection suggest that it is smaller. There is hypertrophic granulation tissue present. The site on his fourth toe is  smaller with some eschar and slough accumulation. 12/19/2022: The nailbed is healed. The wound on his ankle is down to just [name] tiny narrow slit with some slough on the surface. Electronic Signature(s) Signed: 12/19/2022 3:35:17 PM By: [name] MD FACS Entered By: [name] on 12/19/2022 15:35:16 -------------------------------------------------------------------------------- Physical Exam Details Patient Name: Date of Service: NA Colin [name], Colin [name]. 12/19/2022 3:00 PM Medical Record Number: [id number] Patient Account Number: [id number] Date of Birth/Sex: Treating RN: [birth year] (67 y.o. M) Primary Care Provider: [name] Other Clinician: Referring Provider: Treating Provider/Extender: [name]: 24 Constitutional . . . . no acute distress. Respiratory Normal work of breathing on room air. Notes 12/19/2022: The nailbed is healed. The wound on his ankle is down to just [name] tiny narrow slit with some slough on the surface. Electronic Signature(s) Signed: 12/19/2022 3:35:51 PM By: [name] MD FACS Entered By: [name] on 12/19/2022 15:35:50 Lasandra Beech [name] ([id number]) [id number] Page 5 of 12 -------------------------------------------------------------------------------- Physician Orders Details Patient Name: Date of Service: [name] [name]. 12/19/2022 3:00 PM Medical Record Number: [id number] Patient Account Number: [id number] Date of Birth/Sex: Treating RN: 04-04-1956 (67 y.o. 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 - Dr. Lady Gary - room 2 Return [name] Anesthetic (In clinic) Topical Lidocaine 4% applied to wound bed Bathing/ Shower/ Hygiene May shower with protection but do not get wound dressing(s) wet. Protect dressing(s) with water repellant cover (for example, large plastic bag) or [name] cast cover and may then take shower. Edema Control - Lymphedema / SCD / Other Elevate legs to the level of the heart or above for 30 minutes daily and/or when sitting for 3-4 times [name] day throughout the day. Avoid standing for long periods of time. Off-Loading Open toe surgical shoe to: - left foot Other: - knee scooter to left leg, stop wearing your orthoboot Wound Treatment Wound #2 - Ankle Wound Laterality: Left, Medial Cleanser: Normal Saline 1 x Per Week/30 Days Discharge Instructions: Cleanse the wound with Normal Saline prior to applying [name] clean dressing using gauze sponges, not tissue or cotton balls. Peri-Wound Care: Zinc Oxide Ointment 30g tube 1 x Per Week/30 Days Discharge Instructions: Apply Zinc Oxide to periwound with each dressing change Peri-Wound Care: Sween Lotion (Moisturizing lotion) 1 x Per Week/30 Days Discharge Instructions: Apply moisturizing lotion as directed Topical: Gentamicin 1 x Per Week/30 Days Discharge Instructions: As directed by physician Topical: Mupirocin  Ointment 1 x Per Week/30 Days Discharge Instructions: Apply Mupirocin (Bactroban) as instructed Prim Dressing: Promogran Prisma Matrix, 4.34 (sq in) (silver collagen) 1 x Per Week/30 Days ary Discharge Instructions: Moisten collagen with saline or hydrogel Secondary Dressing: Woven Gauze Sponge, Non-Sterile 4x4 in 1 x Per Week/30 Days Discharge Instructions: Apply over primary dressing as directed. Secured With: Transpore Surgical Tape, 2x10 (in/yd) 1 x Per Week/30 Days Discharge Instructions: Secure  dressing with tape as directed. Compression Wrap: Urgo K2 Lite, (equivalent to [name] 3 layer) two layer compression system, regular 1 x Per Week/30 Days Discharge Instructions: Apply Urgo K2 Lite as directed (alternative to 3 layer compression). Colin [name], Colin [name] ([id number]) [id number] Page 6 of 12 Patient Medications llergies: codeine [name] Notifications Medication Indication Start End 12/19/2022 lidocaine DOSE topical 4 % cream - cream topical Electronic Signature(s) Signed: 12/19/2022 3:47:08 PM By: [name] MD FACS Entered By: [name] on 12/19/2022 15:36:06 -------------------------------------------------------------------------------- Problem List Details Patient Name: Date of Service: NA Colin [name]. 12/19/2022 3:00 PM Medical Record Number: [id number] Patient Account Number: [id number] Date of Birth/Sex: Treating RN: 12-13-1955 (67 y.o. 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 12/19/2022 3:00 PM Medical Record Number: [id number] Patient Account Number: [id number] Date of Birth/Sex: Treating RN: 1955/12/19 (67  y.o. Jacques Earthly, Stephannie Peters [name] ([id number]) [id number] Page 7 of 12 Primary Care Provider: [name] Other Clinician: Referring Provider: Treating Provider/Extender: [name] in Treatment: 24 Subjective Chief Complaint Information obtained from Patient Patients presents for treatment of an open diabetic ulcer with exposed bone and osteomyelitis 07/04/2022: Diabetic ankle ulcer History of Present Illness (HPI) ADMISSION 08/25/2021 This is [name] 67 year old man who initially presented to his primary care provider in September 2022 with pain in his left foot. He was sent for an x-ray and while the x-ray was being performed, the tech pointed out [name] wound on his foot that the patient was not aware existed. He does have type 2 diabetes with significant neuropathy. His diabetes is suboptimally controlled with his most recent A1c being 8.5. He also has [name] history of coronary artery disease status post three- vessel CABG. he was initially seen by orthopedics, but they referred him to [facility]. He has undergone at least 7 operations/debridements and several applications of skin substitute under the care of podiatry. He has been in [name] for much of this time. His most recent procedure was July 28, 2021. [name] was biopsied and was found to be consistent with osteomyelitis. Culture also returned positive for corynebacterium. He was seen on August 16, 2021 by infectious disease. [name] PICC line has been placed and he will be receiving [name] 6-week course of IV daptomycin and cefepime. In October 2022, he underwent lower extremity vascular studies. Results are copied here: Right: Resting right ankle-brachial index  is within normal range. No evidence of significant right lower extremity arterial disease. The right toe-brachial index is abnormal. Left: Resting left ankle-brachial index indicates mild left lower extremity arterial  disease. The left toe-brachial index is abnormal. He has not been seen by vascular surgery despite these findings. He presented to clinic today in [name] and is using [name] to offload. Wound VAC was in place. Once this was removed, [name] large ulcer was identified on the left midfoot/ankle. Bone is frankly exposed. There is no malodorous or purulent drainage. There is some granulation tissue over the central portion of the exposed bone. There is [name] tunnel that extends posteriorly for roughly 10 cm. It has been discussed with him by multiple providers that he is at very high risk of losing his lower leg because of this wound. He is extremely eager to avoid this outcome and is here today to review his options as well as receive ongoing wound care. 09/03/2021: Here for reevaluation of his wound. There does not appear to have been any substantial improvement overall since our last visit. He has been in [name] with white foam overlying the exposed bone. We are working on getting him approved for hyperbaric oxygen therapy. 09/10/2021: We are in the process of getting him cleared to begin hyperbaric oxygen therapy. He still needs to obtain [name] chest x-ray. Although the wound measurements are roughly the same, I think the overall appearance of the wound is better. The exposed bone has [name] bit more granulation tissue covering it. He has not received [name] vascular surgery appointment to reevaluate his flow to the wound. 09/17/2021: He has been approved for hyperbaric oxygen therapy and completed his chest x-ray, which I reviewed and it appears normal. The tunnels at the 12 and 10:00 positions are smaller. There is more granulation tissue covering the exposed bone and the undermining has decreased. He still has not received [name] vascular surgery appointment. 09/24/2021: He initiated hyperbaric oxygen therapy this week and is tolerating it well. He has an appointment with vascular surgery coming up on May 16.  The granulation tissue is covering more of the exposed bone and both tunnels are [name] bit smaller. 10/01/2021: He continues to tolerate hyperbaric oxygen therapy. He saw infectious disease and they are planning to pull his PICC line. He has been initiated on oral antibiotics (doxycycline and Augmentin). The wound looks about the same but the tunnels are [name] little bit smaller. The skin seems to be contracting somewhat around the exposed bone. 10/08/2021: The wound is still about the same size, but the tunnels continue to come in and the skin is contracting around the exposed bone. He continues to have some accumulation of necrotic material in the inferoposterior aspect of the wound as well as accumulation at the 12:00 tunnel area. 10/15/2021: The wound is smaller today. The tunnels continue to come in. There is less necrotic tissue present. He does have some periwound maceration. 10/22/2021: The wound is about the same size. There is [name] little bit less undermining at the distal portion. The exposed bone is dark and I am not sure if this is staining from silver nitrate or his VAC sponge or if it represents necrosis. The tunnels are shallower but he does have some serous drainage coming from the 10:00 tunnel. He continues to tolerate hyperbaric oxygen therapy well. 10/29/2021: The undermining continues to improve. The tunnels are about the same. He has good granulation tissue overlying the majority of the exposed  bone. It does appear that perhaps the tubing from his wound VAC has been eroding the skin at the 12 clock position. He continues to accumulate senescent epithelium around the borders of the wound. 11/05/2021: The undermining is almost completely resolved. The tunnels have contracted fairly significantly. No significant slough or debris accumulation. There is still senescent epithelium accumulation around the borders of the wound. He has been tolerating hyperbaric oxygen therapy well. 11/12/2021: Despite the  measurements of the wound being about the same, the wound has changed in its shape and overall, I think it is improved. The undermining has resolved and the tunnels continue to shorten. There is good granulation tissue encroaching over the small area of bone that has remained exposed at the 12 o'clock position. Minimal slough accumulation. He continues to tolerate hyperbaric oxygen therapy well. 11/19/2021: I took [name] PCR culture last week. There was overgrowth of yeast. He is already taking suppressive doxycycline and Augmentin. I added fluconazole to his regimen. The wound is smaller again today. The tunnels continue to shorten. He continues to do well with hyperbaric oxygen therapy. 11/26/2021: For some reason, his foot has become macerated. The wound is narrower but about the same dimensions in its longitudinal aspect. The tunnels continue to shorten. He has some slough buildup on the wound as well as some heaped up senescent epithelium around the perimeter. 12/03/2021: No further maceration of his foot has occurred. The wound has contracted quite significantly from last week. The tunnel at 10:00 is closed. The tunnel at 12:00 is down to just [name] couple of millimeters. No other undermining is present. There is soft tissue coverage of the previously exposed bone. There is just [name] bit of slough and biofilm on the wound surface. 12/10/2021: The wound is looking good. It turns out the tunnel at 12:00 is only exposed when the patient dorsiflexes his foot. It is about 2 cm in depth when he does this; when his foot is in plantarflexion, the tunnel is closed. The bone that was visible at the 12:00 tunnel is completely covered with granulation tissue, but there does feel like some exposed bone deeper into the tunnel area. There is senescent skin heaped up around the periphery. Minimal slough on the wound Colin [name], Colin [name] [phone].pdf Page 8 of 12 surface. 12/16/2021: The wound  dimensions are roughly the same. The surface has nice granulation tissue. The exposed bone at the 12:00 tunnel continues to be covered with more soft tissue. 7/14; patient's wound measures smaller today. Using the wound VAC with underlying collagen. He is also being treated with HBO for underlying osteomyelitis. He tells me he is on doxycycline and ampicillin follows with infectious disease next week 12/31/2021: The wound continues to contract. Unfortunately, the area where the track pad and tubing have been rubbing continues to look like it is applying friction. He says that the home health nurses that have been applying the [facility] have been putting gauze underneath the tubing, but nonetheless there is ongoing tissue breakdown at this site. Light slough accumulation on the wound surface. The tunnel continues to contract. He is tolerating HBO without difficulty. 01/07/2022: Bridging the wound VAC away from the ankle has resulted in significant improvement in the tissue at the apex of the wound. The tunnel is still present and is not all that much shorter, but the overall wound surface is very robust and healthy looking. Minimal slough accumulation. No concern for acute infection. 01/21/2022: The wound continues to contract and has [name] robust granulation  tissue surface. The tunnel has come in considerably and is down to about 1.4 cm. There is still bone exposed within the tunnel but the rest of it is well covered. There is some senescent epithelium at the wound margins and [name] little bit of slough on the surface. 01/28/2022: No significant change in the wound this week, but there has not been any reaccumulation of senescent epithelium or slough. The tunnel is perhaps [name] millimeter less in depth. He has been approved for Apligraf and we will apply this today. 02/04/2022: The wound has contracted somewhat and the tunnel has filled in completely. The wound surface is clean. He is here for Apligraf #2. 02/11/2022: The  wound has contracted further and is now nearly flush with the surrounding skin surface. Light layer of slough. Apligraf #3 plan for today. 02/18/2022: There is [name] band of epithelium trying to cut across the superior portion of the wound. There is robust granulation tissue with just [name] light layer of slough and biofilm on the surface. There was [name] little bit of greenish drainage in the wound VAC but none appreciated on the site itself. He is here for Apligraf #4. 03/04/2022: The wound has contracted considerably. There is good granulation tissue on the surface. Minimal biofilm. He is here for Apligraf #5. 03/18/2022: The wound has epithelialized to the point that it has been divided into 2 areas. Both areas are smaller in total than at his previous visit. There is good granulation tissue present with just [name] little bit of periwound eschar and surface slough. 10/13; left medial foot. The patient has completed treatment with hyperbaric oxygen for underlying osteomyelitis he has had [name] nice response in the wound. Noted today that he still had some greenish drainage. Previously he has been treated with topical gentamicin but apparently that was stopped 2 weeks ago. Fortunately the wound is measuring smaller 10/20; wound looks better no hypergranulation minimal drainage. Granulation tissue looks healthy using gentamicin Hydrofera Blue under compression 04/08/2022: The wound continues to contract tremendously. The response to his treatment for hypertrophic granulation tissue has been quite remarkable. There are just [name] couple of open areas remaining. 04/18/2022: His wound is healed. READMISSION 07/04/2022: He returns to clinic with [name] new ulcer adjacent to where his previous large wound had been. He says that he was wearing boots and using [name] small padded dressing to protect the freshly healed ankle wound. Apparently the dressing rolled up and rubbed on his skin, causing [name] new wound. He contacted our office last week  concerned about the appearance. There is [name] small oval wound on his left medial ankle. It is fairly clean with some periwound dry skin and eschar. 07/11/2022: The wound is about the same size this week. There is [name] layer of rubbery slough on the surface. It has [name] fairly strong odor. 07/18/2022: The wound measured larger this week due to small satellite areas opening in [name] fan pattern distal to the primary wound. He continues to accumulate slough on the wound surface. The culture that I took last week only grew out low levels of skin flora. 07/25/2022: The satellite areas that had opened last week have closed. The main wound is smaller with some slough accumulation. 08/01/2022: The wound continues to contract. There is [name] layer of slough on the surface. 08/08/2022: The wound is [name] little bit smaller again this week. Still with slough buildup. 3/18; patient presents for follow-up. He has been doing dressing changes on his own with silver alginate and antibiotic  ointment. He has been at [employer] over the past 3 weeks. He has developed [name] new wound. 09/05/2022: I am not sure if the new wound that was reported last week is just the satellite area that had closed [name] couple of weeks ago and reopened. Regardless, both wounds are quite small with good epithelialization. There is some eschar buildup around the periphery of both wounds with slough on both wound surfaces. 09/12/2022: The wounds have converged. There is some increased in depth at the distal portion. This seems to be secondary to moisture accumulation as the periwound skin is [name] bit macerated. 09/26/2022: The wound has deteriorated further. There is increased depth at the distal portion and the wound probes to bone. The patient theorizes that the boot he has been wearing is rubbing in this spot and upon inspection of the boot, there is actually [name] hard plastic boot in exactly this location, supporting his theory. 10/05/2022: The wound looks [name] little bit better  this week. The area that probed to bone now has some tissue coverage. There is still some slough buildup on the wound surfaces. 10/10/2022: The wound is strongly malodorous today. There is no purulent drainage, nor any increased erythema. There is some tissue maceration around the wound, but he did not have [name] whole lot of drainage on his dressings. There is slough on the wound surface. 10/17/2022: The wound still has [name] fairly strong odor and the drainage has increased. It is goopy and purulent. The culture that I took last week returned late on Thursday with methicillin and tetracycline resistant staph epidermidis and multiple other staph species at fairly high concentrations. 10/27/2022: The wound has cleaned up considerably. There is no odor and less drainage. The drainage is no longer purulent. He is taking Bactrim as prescribed. 11/03/2022: He completed his course of Bactrim however the wound has deteriorated, has [name] foul odor, and there are fragments of bone breaking off in his wound. His drainage was dark and purulent. 11/08/2022: As expected, the bone biopsy returned with osteomyelitis and osteonecrosis. His culture returned with [name] number of species with the Levaquin I had already prescribed being along with the first-line choice as suggested by the pharmacist. Notably, however, Pseudomonas aeruginosa was not among the Colin [name], Colin [name] [phone].pdf Page 9 of 12 species cultivated. He has an appointment to see Dr. Daiva Eves on Wednesday the 19th; he has vascular follow-up on Thursday the sixth. The wound actually looks [name] little bit better today. There are no chunks of bone falling out, nor am I actually able to probe or palpate bone. He did have [name] bit more drainage, but this was noted as murky or purulent as at his previous visit. There is slough buildup on the wound surface and the granulation tissue present is [name] little bit hypertrophic and friable. 11/15/2022: The  wound actually looks [name] little bit better today. I can still palpate bone with his foot in [name] relaxed, plantarflexed position, but there does seem to be some tissue covering it. The drainage has improved. He has completed the oral levofloxacin that I prescribed. He is going to see vascular surgery on Thursday and has an appointment with infectious disease on Wednesday the 19th. 11/21/2022: There is just [name] tiny little spike of bone that is still palpable. Everything else looks improved. He is having an angiogram next Friday to see if he can be optimized from an arterial inflow perspective. 11/28/2022: The bone is completely covered with soft tissue. The wound is  smaller. 12/05/2022: He had his angiogram last week and 3 vessels in his lower leg was stented. The ankle wound is smaller again today with minimal slough accumulation. He managed to tear his toenail off of his fourth toe today when he was removing his socks. This has left [name] wound with the nailbed/fat layer exposed. 12/12/2022: The wound was measured the same size with [name] little bit more depth to it today, but visual inspection suggest that it is smaller. There is hypertrophic granulation tissue present. The site on his fourth toe is smaller with some eschar and slough accumulation. 12/19/2022: The nailbed is healed. The wound on his ankle is down to just [name] tiny narrow slit with some slough on the surface. Patient History Information obtained from Patient. Family History Cancer - Father, Diabetes - Father,Mother,Paternal Grandparents, Heart Disease - Father, Hypertension - Father, No family history of Hereditary Spherocytosis, Kidney Disease, Lung Disease, Seizures, Stroke, Thyroid Problems, Tuberculosis. Social History Never smoker, Marital Status - Married, Alcohol Use - Rarely, Drug Use - No History, Caffeine Use - Daily. Medical History Eyes Patient has history of Cataracts - Removed 2008 Cardiovascular Patient has history of Coronary Artery  Disease, Hypertension, Myocardial Infarction, Peripheral Arterial Disease Endocrine Patient has history of Type II Diabetes Musculoskeletal Patient has history of Osteomyelitis Neurologic Patient has history of Neuropathy Medical [name] Surgical History Notes nd Cardiovascular Hypercholesterolemia Abnormal EKG CABG X3 2019 Gastrointestinal GERD Musculoskeletal Diabetic foot ulcer Objective Constitutional no acute distress. Vitals Time Taken: 3:04 PM, Height: 74 in, Weight: 187 lbs, BMI: 24, Temperature: 97.8 F, Pulse: 76 bpm, Respiratory Rate: 18 breaths/min, Blood Pressure: 119/70 mmHg, Capillary Blood Glucose: 87 mg/dl. Respiratory Normal work of breathing on room air. General Notes: 12/19/2022: The nailbed is healed. The wound on his ankle is down to just [name] tiny narrow slit with some slough on the surface. Integumentary (Hair, Skin) Wound #2 status is Open. Original cause of wound was Pressure Injury. The date acquired was: 06/13/2022. The wound has been in treatment 24 weeks. The wound is located on the Left,Medial Ankle. The wound measures 0.3cm length x 0.7cm width x 0.4cm depth; 0.165cm^2 area and 0.066cm^3 volume. There is Fat Layer (Subcutaneous Tissue) exposed. There is no tunneling or undermining noted. There is [name] medium amount of serosanguineous drainage noted. The wound margin is distinct with the outline attached to the wound base. There is large (67-100%) red granulation within the wound bed. There is [name] small (1-33%) amount of necrotic tissue within the wound bed including Adherent Slough. The periwound skin appearance had no abnormalities noted for texture. The periwound skin appearance had no abnormalities noted for color. The periwound skin appearance exhibited: Maceration. Periwound temperature was noted as No Abnormality. Wound #4 status is Open. Original cause of wound was Skin T ear/Laceration. The date acquired was: 12/05/2022. The wound has been in treatment 2 weeks.  The wound is located on the Left T Fourth. The wound measures 0cm length x 0cm width x 0cm depth; 0cm^2 area and 0cm^3 volume. There is no tunneling or oe undermining noted. There is [name] none present amount of drainage noted. The wound margin is distinct with the outline attached to the wound base. There is no Colin [name], Colin [name] ([id number]) [phone].pdf Page 10 of 12 granulation within the wound bed. There is no necrotic tissue within the wound bed. The periwound skin appearance had no abnormalities noted for texture. The periwound skin appearance had no abnormalities noted for moisture. The periwound skin appearance had no  abnormalities noted for color. Periwound temperature was noted as No Abnormality. 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 There was [name] Selective/Open Wound Non-Viable Tissue Debridement with [name] total area of 0.16 sq cm performed by [name], MD. With the following instrument(s): Curette to remove Non-Viable tissue/material. Material removed includes [facility] after achieving pain control using Lidocaine 4% T opical Solution. No specimens were taken. [name] time out was conducted at 15:20, prior to the start of the procedure. [name] Minimum amount of bleeding was controlled with Pressure. The procedure was tolerated well. Post Debridement Measurements: 0.3cm length x 0.7cm width x 0.4cm depth; 0.066cm^3 volume. Character of Wound/Ulcer Post Debridement is improved. Severity of Tissue Post Debridement is: Fat layer exposed. Post procedure Diagnosis  Wound #2: Same as Pre-Procedure General Notes: scribed for Dr. Lady Gary by Samuella Bruin, RN. Pre-procedure diagnosis of Wound #2 is [name] Diabetic Wound/Ulcer of the Lower Extremity located on the Left,Medial Ankle . There was [name] Double Layer Compression Therapy Procedure by Burt Ek, RN. Post procedure Diagnosis Wound #2: Same as Pre-Procedure Notes: scribed for Dr. Lady Gary by Samuella Bruin, RN. Plan Follow-up Appointments: Return Appointment in 1 week. - Dr. Lady Gary - room 2 Anesthetic: (In clinic) Topical Lidocaine 4% applied to wound bed Bathing/ Shower/ Hygiene: May shower with protection but do not get wound dressing(s) wet. Protect dressing(s) with water repellant cover (for example, large plastic bag) or [name] cast cover and may then take shower. Edema Control - Lymphedema / SCD / Other: Elevate legs to the level of the heart or above for 30 minutes daily and/or when sitting for 3-4 times [name] day throughout the day. Avoid standing for long periods of time. Off-Loading: Open toe surgical shoe to: - left foot Other: - knee scooter to left leg, stop wearing your orthoboot The following medication(s) was prescribed: lidocaine topical 4 % cream cream topical was prescribed at facility WOUND #2: - Ankle Wound Laterality: Left, Medial Cleanser: Normal Saline 1 x Per Week/30 Days Discharge Instructions: Cleanse the wound with Normal Saline prior to applying [name] clean dressing using gauze sponges, not tissue or cotton balls. Peri-Wound Care: Zinc Oxide Ointment 30g tube 1 x Per Week/30 Days Discharge Instructions: Apply Zinc Oxide to periwound with each dressing change Peri-Wound Care: Sween Lotion (Moisturizing lotion) 1 x Per Week/30 Days Discharge Instructions: Apply moisturizing lotion as directed Topical: Gentamicin 1 x Per Week/30 Days Discharge Instructions: As directed by physician Topical: Mupirocin Ointment 1 x Per Week/30 Days Discharge Instructions: Apply Mupirocin  (Bactroban) as instructed Prim Dressing: Promogran Prisma Matrix, 4.34 (sq in) (silver collagen) 1 x Per Week/30 Days ary Discharge Instructions: Moisten collagen with saline or hydrogel Secondary Dressing: Woven Gauze Sponge, Non-Sterile 4x4 in 1 x Per Week/30 Days Discharge Instructions: Apply over primary dressing as directed. Secured With: Transpore Surgical T ape, 2x10 (in/yd) 1 x Per Week/30 Days Discharge Instructions: Secure dressing with tape as directed. Com pression Wrap: Urgo K2 Lite, (equivalent to [name] 3 layer) two layer compression system, regular 1 x Per Week/30 Days Discharge Instructions: Apply Urgo K2 Lite as directed (alternative to 3 layer compression). 12/19/2022: The nailbed is healed.  The wound on his ankle is down to just [name] tiny narrow slit with some slough on the surface. I used [name] curette to debride slough from the wound. We will continue the mixture of topical gentamicin and mupirocin. I am going to change the contact layer to Prisma silver collagen. Continue 3 layer compression. Follow-up in 1 week. Colin [name], Colin [name] [id number] Page 11 of 12 Electronic Signature(s) Signed: 12/19/2022 3:36:39 PM By: [name] MD FACS Entered By: [name] on 12/19/2022 15:36:39 -------------------------------------------------------------------------------- HxROS Details Patient Name: Date of Service: NA Colin [name], Colin [name]. 12/19/2022 3:00 PM Medical Record Number: [id number] Patient Account Number: [id number] Date of Birth/Sex: Treating RN: 02/08/1956 (67 y.o. M) Primary Care Provider: [name] Other Clinician: Referring Provider: Treating Provider/Extender: [name]: 24 Information Obtained From Patient Eyes Medical History: Positive for: Cataracts - Removed 2008 Cardiovascular Medical History: Positive for: Coronary Artery Disease; Hypertension; Myocardial Infarction; Peripheral Arterial  Disease Past Medical History Notes: Hypercholesterolemia Abnormal EKG CABG X3 2019 Gastrointestinal Medical History: Past Medical History Notes: GERD Endocrine Medical History: Positive for: Type II Diabetes Time with diabetes: 24 years Treated with: Insulin, Oral agents Blood sugar tested every day: Yes Tested : Musculoskeletal Medical History: Positive for: Osteomyelitis Past Medical History Notes: Diabetic foot ulcer Neurologic Medical History: Positive for: Neuropathy HBO Extended History Items Eyes: Cataracts Immunizations Pneumococcal Vaccine: Received Pneumococcal Vaccination: Yes Received Pneumococcal Vaccination On or After 60th BirthdayLANDIN, Colin [name] ([id number]) [id number] Page 12 of 12 Implantable Devices Yes Family and Social History Cancer: Yes - Father; Diabetes: Yes - Father,Mother,Paternal Grandparents; Heart Disease: Yes - Father; Hereditary Spherocytosis: No; Hypertension: Yes - Father; Kidney Disease: No; Lung Disease: No; Seizures: No; Stroke: No; Thyroid Problems: No; Tuberculosis: No; Never smoker; Marital Status - Married; Alcohol Use: Rarely; Drug Use: No History; Caffeine Use: Daily; Financial Concerns: No; Food, Clothing or Shelter Needs: No; Support System Lacking: No; Transportation Concerns: No Electronic Signature(s) Signed: 12/19/2022 3:47:08 PM By: [name] MD FACS Entered By: [name] on 12/19/2022 15:35:23 -------------------------------------------------------------------------------- SuperBill Details Patient Name: Date of Service: NA Colin [name] [name]. 12/19/2022 Medical Record Number: [id number] Patient Account Number: [id number] Date of Birth/Sex: Treating RN: [birth year] (68 y.o. 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 Modifier: Quantity: 1 Physician Procedures : CPT4 Code Description Modifier [id number] - WC PHYS LEVEL 4 - EST PT 25 ICD-10 Diagnosis Description L97.324 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

## 2022-12-26 ENCOUNTER — Ambulatory Visit (INDEPENDENT_AMBULATORY_CARE_PROVIDER_SITE_OTHER): Payer: BC Managed Care – PPO | Admitting: Family

## 2022-12-26 ENCOUNTER — Encounter: Payer: Self-pay | Admitting: Family

## 2022-12-26 ENCOUNTER — Encounter (HOSPITAL_BASED_OUTPATIENT_CLINIC_OR_DEPARTMENT_OTHER): Payer: BC Managed Care – PPO | Admitting: General Surgery

## 2022-12-26 ENCOUNTER — Other Ambulatory Visit: Payer: Self-pay

## 2022-12-26 VITALS — BP 151/89 | HR 72 | Temp 97.2°F | Ht 74.0 in | Wt 195.0 lb

## 2022-12-26 DIAGNOSIS — Z794 Long term (current) use of insulin: Secondary | ICD-10-CM | POA: Diagnosis not present

## 2022-12-26 DIAGNOSIS — L97322 Non-pressure chronic ulcer of left ankle with fat layer exposed: Secondary | ICD-10-CM | POA: Diagnosis not present

## 2022-12-26 DIAGNOSIS — Z951 Presence of aortocoronary bypass graft: Secondary | ICD-10-CM | POA: Diagnosis not present

## 2022-12-26 DIAGNOSIS — Z8249 Family history of ischemic heart disease and other diseases of the circulatory system: Secondary | ICD-10-CM | POA: Diagnosis not present

## 2022-12-26 DIAGNOSIS — I2510 Atherosclerotic heart disease of native coronary artery without angina pectoris: Secondary | ICD-10-CM | POA: Diagnosis not present

## 2022-12-26 DIAGNOSIS — E1151 Type 2 diabetes mellitus with diabetic peripheral angiopathy without gangrene: Secondary | ICD-10-CM | POA: Diagnosis not present

## 2022-12-26 DIAGNOSIS — Z7984 Long term (current) use of oral hypoglycemic drugs: Secondary | ICD-10-CM | POA: Diagnosis not present

## 2022-12-26 DIAGNOSIS — M86672 Other chronic osteomyelitis, left ankle and foot: Secondary | ICD-10-CM

## 2022-12-26 DIAGNOSIS — I739 Peripheral vascular disease, unspecified: Secondary | ICD-10-CM

## 2022-12-26 DIAGNOSIS — L97522 Non-pressure chronic ulcer of other part of left foot with fat layer exposed: Secondary | ICD-10-CM | POA: Diagnosis not present

## 2022-12-26 DIAGNOSIS — E11622 Type 2 diabetes mellitus with other skin ulcer: Secondary | ICD-10-CM | POA: Diagnosis not present

## 2022-12-26 DIAGNOSIS — E11621 Type 2 diabetes mellitus with foot ulcer: Secondary | ICD-10-CM | POA: Diagnosis not present

## 2022-12-26 DIAGNOSIS — Z833 Family history of diabetes mellitus: Secondary | ICD-10-CM | POA: Diagnosis not present

## 2022-12-26 NOTE — Progress Notes (Signed)
RMANI, KELLOGG [name] [id number] Page 1 of 7 Visit Report for 7/[name]/2024 Arrival Information Details Patient Name: Date of Service: NA [name] 7/[name]/2024 1:[name] PM Medical Record Number: [id number] Patient Account Number: [id number] Date of Birth/Sex: Treating RN: [birth year] [name] (67 y.o. M) Primary Care [name]: [name] Other Clinician: Referring [name]: Treating [name]/Extender: [name] in Treatment: 25 Visit Information History Since Last Visit All ordered tests and consults were completed: No Patient Arrived: Knee Scooter Added or deleted any medications: No Arrival Time: 13:14 Any new allergies or adverse reactions: No Accompanied By: self Had [name] fall or experienced change in No Transfer Assistance: None activities of daily living that may affect Patient Identification Verified: Yes risk of falls: Secondary Verification Process Completed: Yes Signs or symptoms of abuse/neglect since last visito No Patient Requires Transmission-Based Precautions: No Hospitalized since last visit: No Patient Has Alerts: No Implantable device outside of the clinic excluding No cellular tissue based products placed in the center since last visit: Has Dressing in Place as Prescribed: Yes Pain Present Now: No Electronic Signature(s) Signed: 7/[name]/2024 5:04:17 PM By: Zenaida Deed RN, BSN Entered By: Zenaida Deed on 07/[name]/2024 13:33:40 -------------------------------------------------------------------------------- Compression Therapy Details Patient Name: Date of Service: [name]. 7/[name]/2024 1:[name] PM Medical Record Number: [id number] Patient Account Number: [id number] Date of Birth/Sex: Treating RN: [name][birth year] (67 y.o. [name] Primary Care [name]: [name] Other Clinician: Referring Caileigh Canche: Treating [name]/Extender: [name] Weeks in Treatment: 25 Compression Therapy  Performed for Wound Assessment: Wound #2 Left,Medial Ankle Performed By: Clinician Zenaida Deed, RN Compression Type: Double Layer Post Procedure Diagnosis Same as Pre-procedure Notes urgo lite Electronic Signature(s) Signed: 7/[name]/2024 5:04:17 PM By: Zenaida Deed RN, BSN Entered By: Zenaida Deed on 07/[name]/2024 13:45:34 [name] ([id number]) [id number] Page 2 of 7 -------------------------------------------------------------------------------- Encounter Discharge Information Details Patient Name: Date of Service: NA [name] 7/[name]/2024 1:[name] PM Medical Record Number: [id number] Patient Account Number: [id number] Date of Birth/Sex: Treating RN: [name][birth year] (67 y.o. [name] Primary Care [name]: [name] Other Clinician: Referring Xavyer Steenson: Treating [name]/Extender: [name] in Treatment: 25 Encounter Discharge Information Items Post Procedure Vitals Discharge Condition: Stable Temperature (F): 98.7 Ambulatory Status: Knee Scooter Pulse (bpm): 74 Discharge Destination: Home Respiratory Rate (breaths/min): 18 Transportation: Private Auto Blood Pressure (mmHg): 144/73 Accompanied By: self Schedule Follow-up Appointment: Yes Clinical Summary of Care: Patient Declined Electronic Signature(s) Signed: 7/[name]/2024 5:04:17 PM By: Zenaida Deed RN, BSN Entered By: Zenaida Deed on 07/[name]/2024 14:01:[name] -------------------------------------------------------------------------------- Lower Extremity Assessment Details Patient Name: Date of Service: NA [name]. 7/[name]/2024 1:[name] PM Medical Record Number: [id number] Patient Account Number: [id number] Date of Birth/Sex: Treating RN: [name] (67 y.o. [name] Primary Care Eryca Bolte: [name] Other Clinician: Referring [name]: Treating [name]/Extender: [name] Weeks in Treatment: 25 Edema  Assessment Assessed: [Left: No] [Right: No] Edema: [Left: Ye] [Right: s] Calf Left: Right: Point of Measurement: From Medial Instep 32 cm Ankle Left: Right: Point of Measurement: From Medial Instep 22.8 cm Vascular Assessment Pulses: Dorsalis Pedis Palpable: [Left:Yes] Extremity colors, hair growth, and conditions: Extremity Color: [Left:Normal] Hair Growth on Extremity: [Left:Yes] Temperature of Extremity: [Left:Warm < 3 seconds] Electronic Signature(s) Signed: 7/[name]/2024 5:04:17 PM By: Zenaida Deed RN, BSN Entered By: Zenaida Deed on 07/[name]/2024 13:34:28 [name] ([id number]) [id number] Page 3 of 7 -------------------------------------------------------------------------------- Multi Wound Chart Details Patient Name: Date of Service: NA [name], Colin  [name] 7/[name]/2024 1:[name] PM Medical Record Number: [id number] Patient Account Number: [id number] Date of Birth/Sex: Treating RN: [name], [name] (67 y.o. M) Primary Care [name]: [name] Other Clinician: Referring [name]: Treating [name]/Extender: [name] Weeks in Treatment: 25 Vital Signs Height(in): 74 Capillary Blood Glucose(mg/dl): 952 Weight(lbs): 841 Pulse(bpm): 74 Body Mass Index(BMI): 24 Blood Pressure(mmHg): 144/73 Temperature(F): 98.7 Respiratory Rate(breaths/min): 20 [2:Photos:] [[name]:[name]] Left, Medial Ankle [name] [name] Wound Location: Pressure Injury [name] [name] Wounding Event: Diabetic Wound/Ulcer of the Lower [name] [name] Primary Etiology: Extremity Cataracts, Coronary Artery Disease, [name] [name] Comorbid History: Hypertension, Myocardial Infarction, Peripheral Arterial Disease, Type II Diabetes, Osteomyelitis, Neuropathy 06/13/2022 [name] [name] Date Acquired: 25 [name] [name] Weeks of Treatment: Open [name] [name] Wound Status: No [name] [name] Wound Recurrence: 0.1x0.1x0.1 [name] [name] Measurements L x W x D (cm) 0.008 [name] [name] [name] (cm) : rea 0.001 [name] [name] Volume (cm) : 99.60% [name]  [name] % Reduction in [name] rea: 99.50% [name] [name] % Reduction in Volume: Grade 3 [name] [name] Classification: None Present [name] [name] Exudate [name] mount: Distinct, outline attached [name] [name] Wound Margin: None Present (0%) [name] [name] Granulation [name] mount: None Present (0%) [name] [name] Necrotic [name] mount: Fat Layer (Subcutaneous Tissue): Yes [name] [name] Exposed Structures: Fascia: No Tendon: No Muscle: No Joint: No Bone: No Large (67-100%) [name] [name] Epithelialization: Debridement - Selective/Open Wound [name] [name] Debridement: Pre-procedure Verification/Time Out 13:40 [name] [name] Taken: Lidocaine 4% Topical Solution [name] [name] Pain Control: Slough [name] [name] Tissue Debrided: Non-Viable Tissue [name] [name] Level: 0.05 [name] [name] Debridement [name] (sq cm): rea Curette [name] [name] Instrument: Minimum [name] [name] Bleeding: Pressure [name] [name] Hemostasis [name] chieved: 0 [name] [name] Procedural Pain: 0 [name] [name] Post Procedural Pain: Procedure was tolerated well [name] [name] Debridement Treatment Response: 0.2x0.3x0.1 [name] [name] Post Debridement Measurements L x W x D (cm) 0.005 [name] [name] Post Debridement Volume: (cm) Rowzee, [name] ([id number]) [id number] Page 4 of 7 No Abnormalities Noted [name] [name] Periwound Skin Texture: Maceration: No [name] [name] Periwound Skin Moisture: No Abnormalities Noted [name] [name] Periwound Skin Color: No Abnormality [name] [name] Temperature: Compression Therapy [name] [name] Procedures Performed: Debridement Treatment Notes Electronic Signature(s) Signed: 7/[name]/2024 1:49:13 PM By: [name] MD FACS Entered By: [name] on 07/[name]/2024 13:49:13 -------------------------------------------------------------------------------- Multi-Disciplinary Care Plan Details Patient Name: Date of Service: [name]. 7/[name]/2024 1:[name] PM Medical Record Number: [id number] Patient Account Number: [id number] Date of Birth/Sex: Treating RN: [birth year] (67 y.o. [name] Primary Care Tesneem Dufrane: [name] Other Clinician: Referring Arley Salamone: Treating Leverett Camplin/Extender: [name] in Treatment: 25 Multidisciplinary Care Plan reviewed with physician Active Inactive Electronic Signature(s) Signed: 7/[name]/2024 5:04:17 PM By: Zenaida Deed RN, BSN Entered By: Zenaida Deed on 07/[name]/2024 13:37:[name] -------------------------------------------------------------------------------- Pain Assessment Details Patient Name: Date of Service: [name]. 7/[name]/2024 1:[name] PM Medical Record Number: [id number] Patient Account Number: [id number] Date of Birth/Sex: Treating RN: [birth year][name] (67 y.o. M) Primary Care Shahd Occhipinti: [name] Other Clinician: Referring Taurus Alamo: Treating Anitta Tenny/Extender: [name] Weeks in Treatment: 25 Active Problems Location of Pain Severity and Description of Pain Patient Has Paino No Site Locations Rate the pain. [name], [name] ([id number]) [id number] Page 5 of 7 Rate the pain. Current Pain Level: 0 Pain Management and Medication Current Pain Management: Electronic Signature(s) Signed: 7/[name]/2024 5:04:17 PM By: Zenaida Deed RN, BSN Entered By: Zenaida Deed on 07/[name]/2024 13:34:12 -------------------------------------------------------------------------------- Patient/Caregiver Education Details Patient Name: Date of Service: NA [name], [name] 7/[name]/2024andnbsp1:[name] PM  Medical Record Number: [id number] Patient Account Number: [id number] Date of Birth/Gender: Treating RN: [birth year] [name] (67 y.o. [name] Primary Care Physician: [name] Other Clinician: Referring Physician: Treating Physician/Extender: [name] in Treatment: 25 Education Assessment Education Provided To: Patient Education Topics Provided Wound/Skin Impairment: Methods: Explain/Verbal Responses: Reinforcements needed, State content correctly [employer]) Signed:  7/[name]/2024 5:04:17 PM By: Zenaida Deed RN, BSN Entered By: Zenaida Deed on 07/[name]/2024 13:37:51 -------------------------------------------------------------------------------- Wound Assessment Details Patient Name: Date of Service: NA [name]. 7/[name]/2024 1:[name] PM Medical Record Number: [id number] Patient Account Number: [id number] Date of Birth/Sex: Treating RN: [birth year][name] (67 y.o. [name] Primary Care Keeara Frees: [name] Other Clinician: Referring Edrick Whitehorn: Treating Haik Mahoney/Extender: Burt, Piatek [name] ([id number]) [id number] Page 6 of 7 Weeks in Treatment: 25 Wound Status Wound Number: 2 Primary Diabetic Wound/Ulcer of the Lower Extremity Etiology: Wound Location: Left, Medial Ankle Wound Open Wounding Event: Pressure Injury Status: Date Acquired: 06/13/2022 Comorbid Cataracts, Coronary Artery Disease, Hypertension, Myocardial Weeks Of Treatment: 25 History: Infarction, Peripheral Arterial Disease, Type II Diabetes, Clustered Wound: No Osteomyelitis, Neuropathy Photos Wound Measurements Length: (cm) 0.1 Width: (cm) 0.1 Depth: (cm) 0.1 Area: (cm) 0.008 Volume: (cm) 0.001 % Reduction in Area: 99.6% % Reduction in Volume: 99.5% Epithelialization: Large (67-100%) Tunneling: No Undermining: No Wound Description Classification: Grade 3 Wound Margin: Distinct, outline attached Exudate Amount: None Present Foul Odor After Cleansing: No Slough/Fibrino No Wound Bed Granulation Amount: None Present (0%) Exposed Structure Necrotic Amount: None Present (0%) Fascia Exposed: No Fat Layer (Subcutaneous Tissue) Exposed: Yes Tendon Exposed: No Muscle Exposed: No Joint Exposed: No Bone Exposed: No Periwound Skin Texture Texture Color No Abnormalities Noted: Yes No Abnormalities Noted: Yes Moisture Temperature / Pain No Abnormalities Noted: Yes Temperature: No Abnormality Treatment Notes Wound #2  (Ankle) Wound Laterality: Left, Medial Cleanser Normal Saline Discharge Instruction: Cleanse the wound with Normal Saline prior to applying [name] clean dressing using gauze sponges, not tissue or cotton balls. Peri-Wound Care Zinc Oxide Ointment 30g tube Discharge Instruction: Apply Zinc Oxide to periwound with each dressing change Sween Lotion (Moisturizing lotion) Discharge Instruction: Apply moisturizing lotion as directed Topical Gentamicin Discharge Instruction: As directed by physician Mupirocin Ointment Discharge Instruction: Apply Mupirocin (Bactroban) as instructed [name], [name] ([id number]) [phone] Page 7 of 7 Primary Dressing Promogran Prisma Matrix, 4.34 (sq in) (silver collagen) Discharge Instruction: Moisten collagen with saline or hydrogel Secondary Dressing Woven Gauze Sponge, Non-Sterile 4x4 in Discharge Instruction: Apply over primary dressing as directed. Secured With Transpore Surgical Tape, 2x10 (in/yd) Discharge Instruction: Secure dressing with tape as directed. Compression Wrap Urgo K2 Lite, (equivalent to [name] 3 layer) two layer compression system, regular Discharge Instruction: Apply Urgo K2 Lite as directed (alternative to 3 layer compression). Compression Stockings Add-Ons Electronic Signature(s) Signed: 7/[name]/2024 5:04:17 PM By: Zenaida Deed RN, BSN Entered By: Zenaida Deed on 07/[name]/2024 13:35:54 -------------------------------------------------------------------------------- Vitals Details Patient Name: Date of Service: NA Colin [name]. 7/[name]/2024 1:[name] PM Medical Record Number: [id number] Patient Account Number: [id number] Date of Birth/Sex: Treating RN: [birth year]Jul-[name] (67 y.o. M) Primary Care Elishia Kaczorowski: [name] Other Clinician: Referring Katee Wentland: Treating Somtochukwu Woollard/Extender: [name] Weeks in Treatment: 25 Vital Signs Time Taken: 01:[name] Temperature (F): 98.7 Height (in): 74 Pulse (bpm):  74 Weight (lbs): 187 Respiratory Rate (breaths/min): 20 Body Mass Index (BMI): 24 Blood Pressure (mmHg): 144/73 Capillary Blood Glucose (mg/dl): 433 Reference Range: 80 - 120 mg / dl Notes glucose per pt report this am  Electronic Signature(s) Signed: 7/[name]/2024 5:04:17 PM By: Zenaida Deed RN, BSN Entered By: Zenaida Deed on 07/[name]/2024 13:34:02

## 2022-12-26 NOTE — Assessment & Plan Note (Signed)
[name] is s/p aortogram, angiogram and angioplastly with improved circulation to his left lower leg and has seen improved wound healing. Has follow up with Vascular Surgery.

## 2022-12-26 NOTE — Assessment & Plan Note (Addendum)
[name] has been off antibiotics for 3-4 months now and has improved wound healing following vascular intervention. Previous inflammatory markers were not concerning for any active infection. Would recommend continued wound care and watchful waiting. Encouraged continued protein intake. Discussed that with Charcot foot and diabetic neuropathy remains at significant risk of complicated healing of any wounds and increased risk of infection and the potential need for surgical intervention. Appears at this time with vascular intervention appears to be moving in a positive direction. Discussed signs/symptoms to look for that would be concerning for infection. Follow up with ID as needed.

## 2022-12-26 NOTE — Progress Notes (Signed)
Subjective:    Patient ID: [name], male    DOB: 18-May-1956, 67 y.o.   MRN: [id number]  Chief Complaint  Patient presents with   Follow-up   Osteomyelitis     HPI:  [name] is a 67 y.o. male with chronic osteomyelitis of the left foot last seen by Dr. Daiva Eves on 11/30/2022 with recommendations for either long-term antibiotics or pulsed antibiotics as needed with continued wound care. In the interim was seen by Vascular Surgery for aortogram and left lower extremity angiogram with angioplasty. Wound care has seen significant improvements in wound healing since the vascular intervention. Here today for follow up.  [name] has been doing well since his last office visit and has not been off antibiotics for about 3-4 months having previous taken levofloxacin, amoxicillin-clavulanate, and doxycycline. Wound continues to heal well with no drainage. Has changed eating habits and increased protein intake with fish and chicken as well as protein supplements. Performing wound care per Dr. Lady Gary. Does not smoke and blood sugars have been well controlled. No fevers or chills.    Allergies  Allergen Reactions   Codeine Sulfate [Codeine] Nausea Only      Outpatient Medications Prior to Visit  Medication Sig Dispense Refill   acetaminophen (TYLENOL) 500 MG tablet Take 2 tablets (1,000 mg total) by mouth every 6 (six) hours as needed for mild pain or fever. 30 tablet 0   aspirin EC 81 MG tablet Take 81 mg by mouth daily.     atorvastatin (LIPITOR) 80 MG tablet Take 80 mg by mouth daily.     calcium carbonate (TUMS - DOSED IN MG ELEMENTAL CALCIUM) 500 MG chewable tablet Chew 2 tablets by mouth daily as needed for indigestion or heartburn.     clopidogrel (PLAVIX) 75 MG tablet Take 1 tablet (75 mg total) by mouth daily. 30 tablet 11   ibuprofen (ADVIL) 200 MG tablet Take 400 mg by mouth every 8 (eight) hours as needed (pain.).     LANTUS 100 UNIT/ML injection Inject 30 Units into  the skin 2 (two) times daily.     lisinopril (ZESTRIL) 10 MG tablet Take 10 mg by mouth in the morning.     metFORMIN (GLUCOPHAGE) 850 MG tablet Take 850 mg by mouth 3 (three) times daily.     metoprolol succinate (TOPROL-XL) 50 MG 24 hr tablet Take 50 mg by mouth daily. Take with or immediately following a meal.     sitaGLIPtin (JANUVIA) 100 MG tablet Take 100 mg by mouth every morning.      No facility-administered medications prior to visit.     Past Medical History:  Diagnosis Date   Acute osteomyelitis of calcaneum (HCC) 11/29/2022   Coronary artery disease    Diabetes mellitus without complication (HCC)    Type II   GERD (gastroesophageal reflux disease)    Hypertension    Myocardial infarction [facility])    "Silent"   Neuropathy      Past Surgical History:  Procedure Laterality Date   ABDOMINAL AORTOGRAM W/LOWER EXTREMITY N/A 12/02/2022   Procedure: ABDOMINAL AORTOGRAM W/LOWER EXTREMITY;  Surgeon: [name], MD;  Location: [facility];  Service: Cardiovascular;  Laterality: N/A;   APPLICATION OF WOUND VAC Left 07/28/2021   Procedure: APPLICATION OF WOUND VAC;  Surgeon: Park Liter, DPM;  Location: [facility];  Service: Podiatry;  Laterality: Left;   APPLICATION OF WOUND VAC Left 03/19/2021   Procedure: APPLICATION OF WOUND VAC;  Surgeon: Park Liter, DPM;  Location: [facility];  Service: Podiatry;  Laterality: Left;   BONE BIOPSY Left 05/19/2021   Procedure: BONE BIOPSY;  Surgeon: Park Liter, DPM;  Location: [facility];  Service: Podiatry;  Laterality: Left;   CATARACT EXTRACTION Bilateral    COLONOSCOPY W/ POLYPECTOMY     CORONARY ARTERY BYPASS GRAFT N/A 12/26/2017   Procedure: CORONARY ARTERY BYPASS GRAFTING (CABG) x 3 Using Left Internal Mammary Artery (LIMA) and Edoscopically Harvested Left Leg Greater Saphenous Vein Graft (SVG); -LIMA to LAD, -SVG to LEFT CIRCUMFLEX, -SVG to PDA,;  Surgeon: Kerin Perna, MD;  Location: [facility];  Service: Open Heart Surgery;   Laterality: N/A;   CORONARY PRESSURE/FFR STUDY N/A 12/19/2017   Procedure: INTRAVASCULAR PRESSURE WIRE/FFR STUDY;  Surgeon: Lyn Records, MD;  Location: [facility];  Service: Cardiovascular;  Laterality: N/A;   ENDOVEIN HARVEST OF GREATER SAPHENOUS VEIN Left 12/26/2017   Procedure: ENDOVEIN HARVEST OF GREATER SAPHENOUS VEIN: Left Thigh to below the knee;  Surgeon: Kerin Perna, MD;  Location: [facility] OR;  Service: Open Heart Surgery;  Laterality: Left;   LEFT HEART CATH AND CORONARY ANGIOGRAPHY N/A 12/19/2017   Procedure: LEFT HEART CATH AND CORONARY ANGIOGRAPHY;  Surgeon: Lyn Records, MD;  Location: [facility];  Service: Cardiovascular;  Laterality: N/A;   PERIPHERAL VASCULAR INTERVENTION  12/02/2022   Procedure: PERIPHERAL VASCULAR INTERVENTION;  Surgeon: [name], MD;  Location: [facility];  Service: Cardiovascular;;   TEE WITHOUT CARDIOVERSION N/A 12/26/2017   Procedure: TRANSESOPHAGEAL ECHOCARDIOGRAM (TEE);  Surgeon: Donata Clay, Theron Arista, MD;  Location: [facility] OR;  Service: Open Heart Surgery;  Laterality: N/A;   VASECTOMY     WOUND DEBRIDEMENT Left 03/16/2021   Procedure: DEBRIDEMENT LEFT FOOT WOUND;  Surgeon: Park Liter, DPM;  Location: [facility];  Service: Podiatry;  Laterality: Left;   WOUND DEBRIDEMENT Left 04/13/2021   Procedure: DEBRIDEMENT WOUND;  Surgeon: Park Liter, DPM;  Location: [facility];  Service: Podiatry;  Laterality: Left;  Leave patient on stretcher   WOUND DEBRIDEMENT Left 05/19/2021   Procedure: DEBRIDEMENT WOUND;  Surgeon: Park Liter, DPM;  Location: [facility];  Service: Podiatry;  Laterality: Left;   WOUND DEBRIDEMENT Left 07/06/2021   Procedure: DEBRIDEMENT WOUND;  Surgeon: Park Liter, DPM;  Location: [facility];  Service: Podiatry;  Laterality: Left;   WOUND DEBRIDEMENT Left 07/28/2021   Procedure: DEBRIDEMENT WOUND AND APPLICATION OF SKIN GRAFT SUBSTITUTE;  Surgeon: Park Liter, DPM;  Location: [facility];  Service: Podiatry;   Laterality: Left;   WOUND DEBRIDEMENT Left 03/19/2021   Procedure: DEBRIDEMENT WOUND;  Surgeon: Park Liter, DPM;  Location: [facility];  Service: Podiatry;  Laterality: Left;       Review of Systems  Constitutional:  Negative for chills, diaphoresis, fatigue and fever.  Respiratory:  Negative for cough, chest tightness, shortness of breath and wheezing.   Cardiovascular:  Negative for chest pain.  Gastrointestinal:  Negative for abdominal pain, diarrhea, nausea and vomiting.      Objective:    BP (!) 151/89   Pulse 72   Temp (!) 97.2 F (36.2 C) (Temporal)   Ht 6\' 2"  (1.88 m)   Wt 195 lb (88.5 kg)   SpO2 98%   BMI 25.04 kg/m  Nursing note and vital signs reviewed.  Physical Exam Constitutional:      General: He is not in acute distress.    Appearance: He is well-developed.  Comments: Seated in the chair; using a scooter  Cardiovascular:     Rate and Rhythm: Normal rate and regular rhythm.     Heart sounds: Normal heart sounds.  Pulmonary:     Effort: Pulmonary effort is normal.     Breath sounds: Normal breath sounds.  Skin:    General: Skin is warm and dry.  Neurological:     Mental Status: He is alert and oriented to person, place, and time.  Psychiatric:        Mood and Affect: Mood normal.         12/26/2022    8:54 AM 11/30/2022    9:40 AM 12/28/2021   11:08 AM 11/03/2021    1:47 PM 09/29/2021    3:07 PM  Depression screen PHQ 2/9  Decreased Interest 0 0 0 0 0  Down, Depressed, Hopeless 0 0 0 0 0  PHQ - 2 Score 0 0 0 0 0       Assessment & Plan:    Patient Active Problem List   Diagnosis Date Noted   Acute osteomyelitis of calcaneum (HCC) 11/29/2022   Peripheral artery disease (HCC) 08/16/2021   Osteomyelitis (HCC) 03/14/2021   Diabetic foot ulcer (HCC) 03/14/2021   S/P CABG x 3 12/26/2017   Coronary artery disease 12/11/2017   GERD (gastroesophageal reflux disease) 12/04/2017   Controlled type 2 diabetes mellitus with neuropathy (HCC)  12/04/2017   Hypercholesterolemia 12/04/2017   Abnormal EKG 12/04/2017   Hypertension 12/04/2017     Problem List Items Addressed This Visit       Cardiovascular and Mediastinum   Peripheral artery disease [facility])    [name] is s/p aortogram, angiogram and angioplastly with improved circulation to his left lower leg and has seen improved wound healing. Has follow up with Vascular Surgery.         Musculoskeletal and Integument   Osteomyelitis (HCC) - Primary    [name] has been off antibiotics for 3-4 months now and has improved wound healing following vascular intervention. Previous inflammatory markers were not concerning for any active infection. Would recommend continued wound care and watchful waiting. Encouraged continued protein intake. Discussed that with Charcot foot and diabetic neuropathy remains at significant risk of complicated healing of any wounds and increased risk of infection and the potential need for surgical intervention. Appears at this time with vascular intervention appears to be moving in a positive direction. Discussed signs/symptoms to look for that would be concerning for infection. Follow up with ID as needed.        I am having [name] "[name]" maintain his sitaGLIPtin, aspirin EC, calcium carbonate, acetaminophen, Lantus, atorvastatin, lisinopril, metFORMIN, ibuprofen, metoprolol succinate, and clopidogrel.   Follow-up: As needed   Marcos Eke, MSN, FNP-C Nurse Practitioner [facility] Main number: [phone]

## 2022-12-26 NOTE — Progress Notes (Signed)
[name] [id number] Page 1 of 12 Visit Report for 12/26/2022 Chief Complaint Document Details Patient Name: Date of Service: NA [name] 12/26/2022 1:15 PM Medical Record Number: [id number] Patient Account Number: [id number] Date of Birth/Sex: Treating RN: [birth year] (67 y.o. M) Primary Care Provider: [name] Other Clinician: Referring Provider: Treating Provider/Extender: [name] in Treatment: 25 Information Obtained from: Patient Chief Complaint Patients presents for treatment of an open diabetic ulcer with exposed bone and osteomyelitis 07/04/2022: Diabetic ankle ulcer Electronic Signature(s) Signed: 12/26/2022 1:49:41 PM By: [name] MD FACS Entered By: [name] on 12/26/2022 13:49:41 -------------------------------------------------------------------------------- Debridement Details Patient Name: Date of Service: NA Colin [name]. 12/26/2022 1:15 PM Medical Record Number: [id number] Patient Account Number: [id number] Date of Birth/Sex: Treating RN: 1955-11-29 (67 y.o. [name] Primary Care Provider: [name] Other Clinician: Referring Provider: Treating Provider/Extender: [name] Weeks in Treatment: 25 Debridement Performed for Assessment: Wound #2 Left,Medial Ankle Performed By: Physician [name], MD Debridement Type: Debridement Severity of Tissue Pre Debridement: Fat layer exposed Level of Consciousness (Pre-procedure): Awake and Alert Pre-procedure Verification/Time Out Yes - 13:40 Taken: Start Time: 13:41 Pain Control: Lidocaine 4% T opical Solution Percent of Wound Bed Debrided: 100% T Area Debrided (cm): otal 0.05 Tissue and other material debrided: Non-Viable, Slough, Slough Level: Non-Viable Tissue Debridement Description: Selective/Open Wound Instrument: Curette Bleeding: Minimum Hemostasis Achieved:  Pressure Procedural Pain: 0 Post Procedural Pain: 0 Response to Treatment: Procedure was tolerated well Level of Consciousness (Post- Awake and Alert procedure): Post Debridement Measurements of Total Wound Length: (cm) 0.2 Width: (cm) 0.3 Depth: (cm) 0.1 Volume: (cm) 0.005 Stricker, [name] ([id number]) [id number] Page 2 of 12 Character of Wound/Ulcer Post Debridement: Improved Severity of Tissue Post Debridement: Fat layer exposed Post Procedure Diagnosis Same as Pre-procedure Notes scribed for Dr. Lady Gary by Zenaida Deed, RN Electronic Signature(s) Signed: 12/26/2022 1:58:49 PM By: [name] MD FACS Signed: 12/26/2022 5:04:17 PM By: Zenaida Deed RN, BSN Entered By: Zenaida Deed on 12/26/2022 13:45:07 -------------------------------------------------------------------------------- HPI Details Patient Name: Date of Service: NA [name]. 12/26/2022 1:15 PM Medical Record Number: [id number] Patient Account Number: [id number] Date of Birth/Sex: Treating RN: 12/29/1955 (67 y.o. M) Primary Care Provider: [name] Other Clinician: Referring Provider: Treating Provider/Extender: [name] in Treatment: 25 History of Present Illness HPI Description: ADMISSION 08/25/2021 This is [name] 67 year old man who initially presented to his primary care provider in September 2022 with pain in his left foot. He was sent for an x-ray and while the x-ray was being performed, the tech pointed out [name] wound on his foot that the patient was not aware existed. He does have type 2 diabetes with significant neuropathy. His diabetes is suboptimally controlled with his most recent A1c being 8.5. He also has [name] history of coronary artery disease status post three- vessel CABG. he was initially seen by orthopedics, but they referred him to [facility]. He has undergone at least 7 operations/debridements and several applications  of skin substitute under the care of podiatry. He has been in [name] for much of this time. His most recent procedure was July 28, 2021. [name] was biopsied and was found to be consistent with osteomyelitis. Culture also returned positive for corynebacterium. He was seen on August 16, 2021 by infectious disease. [name] PICC line has been placed and he will be receiving [name] 6-week course  of IV daptomycin and cefepime. In October 2022, he underwent lower extremity vascular studies. Results are copied here: Right: Resting right ankle-brachial index is within normal range. No evidence of significant right lower extremity arterial disease. The right toe-brachial index is abnormal. Left: Resting left ankle-brachial index indicates mild left lower extremity arterial disease. The left toe-brachial index is abnormal. He has not been seen by vascular surgery despite these findings. He presented to clinic today in [name] and is using [name] to offload. Wound VAC was in place. Once this was removed, [name] large ulcer was identified on the left midfoot/ankle. Bone is frankly exposed. There is no malodorous or purulent drainage. There is some granulation tissue over the central portion of the exposed bone. There is [name] tunnel that extends posteriorly for roughly 10 cm. It has been discussed with him by multiple providers that he is at very high risk of losing his lower leg because of this wound. He is extremely eager to avoid this outcome and is here today to review his options as well as receive ongoing wound care. 09/03/2021: Here for reevaluation of his wound. There does not appear to have been any substantial improvement overall since our last visit. He has been in [name] with white foam overlying the exposed bone. We are working on getting him approved for hyperbaric oxygen therapy. 09/10/2021: We are in the process of getting him cleared to begin hyperbaric oxygen therapy. He still  needs to obtain [name] chest x-ray. Although the wound measurements are roughly the same, I think the overall appearance of the wound is better. The exposed bone has [name] bit more granulation tissue covering it. He has not received [name] vascular surgery appointment to reevaluate his flow to the wound. 09/17/2021: He has been approved for hyperbaric oxygen therapy and completed his chest x-ray, which I reviewed and it appears normal. The tunnels at the 12 and 10:00 positions are smaller. There is more granulation tissue covering the exposed bone and the undermining has decreased. He still has not received [name] vascular surgery appointment. 09/24/2021: He initiated hyperbaric oxygen therapy this week and is tolerating it well. He has an appointment with vascular surgery coming up on May 16. The granulation tissue is covering more of the exposed bone and both tunnels are [name] bit smaller. 10/01/2021: He continues to tolerate hyperbaric oxygen therapy. He saw infectious disease and they are planning to pull his PICC line. He has been initiated on oral antibiotics (doxycycline and Augmentin). The wound looks about the same but the tunnels are [name] little bit smaller. The skin seems to be contracting somewhat around the exposed bone. 10/08/2021: The wound is still about the same size, but the tunnels continue to come in and the skin is contracting around the exposed bone. He continues to have some accumulation of necrotic material in the inferoposterior aspect of the wound as well as accumulation at the 12:00 tunnel area. 10/15/2021: The wound is smaller today. The tunnels continue to come in. There is less necrotic tissue present. He does have some periwound maceration. GERMAN, MANKE [name] ([id number]) [id number] Page 3 of 12 10/22/2021: The wound is about the same size. There is [name] little bit less undermining at the distal portion. The exposed bone is dark and I am not sure if this is staining from silver  nitrate or his VAC sponge or if it represents necrosis. The tunnels are shallower but he does have some serous drainage coming from the 10:00  tunnel. He continues to tolerate hyperbaric oxygen therapy well. 10/29/2021: The undermining continues to improve. The tunnels are about the same. He has good granulation tissue overlying the majority of the exposed bone. It does appear that perhaps the tubing from his wound VAC has been eroding the skin at the 12 clock position. He continues to accumulate senescent epithelium around the borders of the wound. 11/05/2021: The undermining is almost completely resolved. The tunnels have contracted fairly significantly. No significant slough or debris accumulation. There is still senescent epithelium accumulation around the borders of the wound. He has been tolerating hyperbaric oxygen therapy well. 11/12/2021: Despite the measurements of the wound being about the same, the wound has changed in its shape and overall, I think it is improved. The undermining has resolved and the tunnels continue to shorten. There is good granulation tissue encroaching over the small area of bone that has remained exposed at the 12 o'clock position. Minimal slough accumulation. He continues to tolerate hyperbaric oxygen therapy well. 11/19/2021: I took [name] PCR culture last week. There was overgrowth of yeast. He is already taking suppressive doxycycline and Augmentin. I added fluconazole to his regimen. The wound is smaller again today. The tunnels continue to shorten. He continues to do well with hyperbaric oxygen therapy. 11/26/2021: For some reason, his foot has become macerated. The wound is narrower but about the same dimensions in its longitudinal aspect. The tunnels continue to shorten. He has some slough buildup on the wound as well as some heaped up senescent epithelium around the perimeter. 12/03/2021: No further maceration of his foot has occurred. The wound has contracted quite  significantly from last week. The tunnel at 10:00 is closed. The tunnel at 12:00 is down to just [name] couple of millimeters. No other undermining is present. There is soft tissue coverage of the previously exposed bone. There is just [name] bit of slough and biofilm on the wound surface. 12/10/2021: The wound is looking good. It turns out the tunnel at 12:00 is only exposed when the patient dorsiflexes his foot. It is about 2 cm in depth when he does this; when his foot is in plantarflexion, the tunnel is closed. The bone that was visible at the 12:00 tunnel is completely covered with granulation tissue, but there does feel like some exposed bone deeper into the tunnel area. There is senescent skin heaped up around the periphery. Minimal slough on the wound surface. 12/16/2021: The wound dimensions are roughly the same. The surface has nice granulation tissue. The exposed bone at the 12:00 tunnel continues to be covered with more soft tissue. 7/14; patient's wound measures smaller today. Using the wound VAC with underlying collagen. He is also being treated with HBO for underlying osteomyelitis. He tells me he is on doxycycline and ampicillin follows with infectious disease next week 12/31/2021: The wound continues to contract. Unfortunately, the area where the track pad and tubing have been rubbing continues to look like it is applying friction. He says that the home health nurses that have been applying the [facility] have been putting gauze underneath the tubing, but nonetheless there is ongoing tissue breakdown at this site. Light slough accumulation on the wound surface. The tunnel continues to contract. He is tolerating HBO without difficulty. 01/07/2022: Bridging the wound VAC away from the ankle has resulted in significant improvement in the tissue at the apex of the wound. The tunnel is still present and is not all that much shorter, but the overall wound surface is very robust  and healthy looking. Minimal  slough accumulation. No concern for acute infection. 01/21/2022: The wound continues to contract and has [name] robust granulation tissue surface. The tunnel has come in considerably and is down to about 1.4 cm. There is still bone exposed within the tunnel but the rest of it is well covered. There is some senescent epithelium at the wound margins and [name] little bit of slough on the surface. 01/28/2022: No significant change in the wound this week, but there has not been any reaccumulation of senescent epithelium or slough. The tunnel is perhaps [name] millimeter less in depth. He has been approved for Apligraf and we will apply this today. 02/04/2022: The wound has contracted somewhat and the tunnel has filled in completely. The wound surface is clean. He is here for Apligraf #2. 02/11/2022: The wound has contracted further and is now nearly flush with the surrounding skin surface. Light layer of slough. Apligraf #3 plan for today. 02/18/2022: There is [name] band of epithelium trying to cut across the superior portion of the wound. There is robust granulation tissue with just [name] light layer of slough and biofilm on the surface. There was [name] little bit of greenish drainage in the wound VAC but none appreciated on the site itself. He is here for Apligraf #4. 03/04/2022: The wound has contracted considerably. There is good granulation tissue on the surface. Minimal biofilm. He is here for Apligraf #5. 03/18/2022: The wound has epithelialized to the point that it has been divided into 2 areas. Both areas are smaller in total than at his previous visit. There is good granulation tissue present with just [name] little bit of periwound eschar and surface slough. 10/13; left medial foot. The patient has completed treatment with hyperbaric oxygen for underlying osteomyelitis he has had [name] nice response in the wound. Noted today that he still had some greenish drainage. Previously he has been treated with topical gentamicin but apparently  that was stopped 2 weeks ago. Fortunately the wound is measuring smaller 10/20; wound looks better no hypergranulation minimal drainage. Granulation tissue looks healthy using gentamicin Hydrofera Blue under compression 04/08/2022: The wound continues to contract tremendously. The response to his treatment for hypertrophic granulation tissue has been quite remarkable. There are just [name] couple of open areas remaining. 04/18/2022: His wound is healed. READMISSION 07/04/2022: He returns to clinic with [name] new ulcer adjacent to where his previous large wound had been. He says that he was wearing boots and using [name] small padded dressing to protect the freshly healed ankle wound. Apparently the dressing rolled up and rubbed on his skin, causing [name] new wound. He contacted our office last week concerned about the appearance. There is [name] small oval wound on his left medial ankle. It is fairly clean with some periwound dry skin and eschar. 07/11/2022: The wound is about the same size this week. There is [name] layer of rubbery slough on the surface. It has [name] fairly strong odor. 07/18/2022: The wound measured larger this week due to small satellite areas opening in [name] fan pattern distal to the primary wound. He continues to accumulate slough on the wound surface. The culture that I took last week only grew out low levels of skin flora. 07/25/2022: The satellite areas that had opened last week have closed. The main wound is smaller with some slough accumulation. 08/01/2022: The wound continues to contract. There is [name] layer of slough on the surface. 08/08/2022: The wound is [name] little bit smaller again this week. Still  with slough buildup. [name], [name] ([id number]) [id number] Page 4 of 12 3/18; patient presents for follow-up. He has been doing dressing changes on his own with silver alginate and antibiotic ointment. He has been at [employer] over the past 3 weeks. He has developed [name] new  wound. 09/05/2022: I am not sure if the new wound that was reported last week is just the satellite area that had closed [name] couple of weeks ago and reopened. Regardless, both wounds are quite small with good epithelialization. There is some eschar buildup around the periphery of both wounds with slough on both wound surfaces. 09/12/2022: The wounds have converged. There is some increased in depth at the distal portion. This seems to be secondary to moisture accumulation as the periwound skin is [name] bit macerated. 09/26/2022: The wound has deteriorated further. There is increased depth at the distal portion and the wound probes to bone. The patient theorizes that the boot he has been wearing is rubbing in this spot and upon inspection of the boot, there is actually [name] hard plastic boot in exactly this location, supporting his theory. 10/05/2022: The wound looks [name] little bit better this week. The area that probed to bone now has some tissue coverage. There is still some slough buildup on the wound surfaces. 10/10/2022: The wound is strongly malodorous today. There is no purulent drainage, nor any increased erythema. There is some tissue maceration around the wound, but he did not have [name] whole lot of drainage on his dressings. There is slough on the wound surface. 10/17/2022: The wound still has [name] fairly strong odor and the drainage has increased. It is goopy and purulent. The culture that I took last week returned late on Thursday with methicillin and tetracycline resistant staph epidermidis and multiple other staph species at fairly high concentrations. 10/27/2022: The wound has cleaned up considerably. There is no odor and less drainage. The drainage is no longer purulent. He is taking Bactrim as prescribed. 11/03/2022: He completed his course of Bactrim however the wound has deteriorated, has [name] foul odor, and there are fragments of bone breaking off in his wound. His drainage was dark and purulent. 11/08/2022: As  expected, the bone biopsy returned with osteomyelitis and osteonecrosis. His culture returned with [name] number of species with the Levaquin I had already prescribed being along with the first-line choice as suggested by the pharmacist. Notably, however, Pseudomonas aeruginosa was not among the species cultivated. He has an appointment to see Dr. Daiva Eves on Wednesday the 19th; he has vascular follow-up on Thursday the sixth. The wound actually looks [name] little bit better today. There are no chunks of bone falling out, nor am I actually able to probe or palpate bone. He did have [name] bit more drainage, but this was noted as murky or purulent as at his previous visit. There is slough buildup on the wound surface and the granulation tissue present is [name] little bit hypertrophic and friable. 11/15/2022: The wound actually looks [name] little bit better today. I can still palpate bone with his foot in [name] relaxed, plantarflexed position, but there does seem to be some tissue covering it. The drainage has improved. He has completed the oral levofloxacin that I prescribed. He is going to see vascular surgery on Thursday and has an appointment with infectious disease on Wednesday the 19th. 11/21/2022: There is just [name] tiny little spike of bone that is still palpable. Everything else looks improved. He is having an angiogram next Friday to see  if he can be optimized from an arterial inflow perspective. 11/28/2022: The bone is completely covered with soft tissue. The wound is smaller. 12/05/2022: He had his angiogram last week and 3 vessels in his lower leg was stented. The ankle wound is smaller again today with minimal slough accumulation. He managed to tear his toenail off of his fourth toe today when he was removing his socks. This has left [name] wound with the nailbed/fat layer exposed. 12/12/2022: The wound was measured the same size with [name] little bit more depth to it today, but visual inspection suggest that it is smaller. There is  hypertrophic granulation tissue present. The site on his fourth toe is smaller with some eschar and slough accumulation. 12/19/2022: The nailbed is healed. The wound on his ankle is down to just [name] tiny narrow slit with some slough on the surface. 12/29/2022: His wound is nearly healed. He met with infectious disease today and both his CRP and sed rate were down significantly from prior and they did not feel he needed any additional antibiotic therapy. He has follow-up with vascular surgery on Friday. Electronic Signature(s) Signed: 12/26/2022 1:50:29 PM By: [name] MD FACS Entered By: [name] on 12/26/2022 13:50:29 -------------------------------------------------------------------------------- Physical Exam Details Patient Name: Date of Service: NA Colin [name]. 12/26/2022 1:15 PM Medical Record Number: [id number] Patient Account Number: [id number] Date of Birth/Sex: Treating RN: 1956-04-19 (67 y.o. M) Primary Care Provider: [name] Other Clinician: Referring Provider: Treating Provider/Extender: [name] Weeks in Treatment: 25 Constitutional Slightly hypertensive. . . . no acute distress. Respiratory Normal work of breathing on room air. Notes [name], [name] ([id number]) [id number] Page 5 of 12 12/26/2022: The wound is very nearly healed. There is [name] minuscule opening and [name] little bit of light slough on the surface. No concern for infection. Electronic Signature(s) Signed: 12/26/2022 1:52:56 PM By: [name] MD FACS Entered By: [name] on 12/26/2022 13:52:56 -------------------------------------------------------------------------------- Physician Orders Details Patient Name: Date of Service: NA Colin [name]. 12/26/2022 1:15 PM Medical Record Number: [id number] Patient Account Number: [id number] Date of Birth/Sex: Treating RN: 29-Apr-1956 (67 y.o. 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 - Dr. Lady Gary - room 2 Return [name] Monday 7/22 @ 3:00 pm Anesthetic (In clinic) Topical Lidocaine 4% applied to wound bed Bathing/ Shower/ Hygiene May shower with protection but do not get wound dressing(s) wet. Protect dressing(s) with water repellant cover (for example, large plastic bag) or [name] cast cover and may then take shower. Edema Control - Lymphedema / SCD / Other Elevate legs to the level of the heart or above for 30 minutes daily and/or when sitting for 3-4 times [name] day throughout the day. Avoid standing for long periods of time. Off-Loading Open toe surgical shoe to: - left foot Other: - knee scooter to left leg, stop wearing your orthoboot Wound Treatment Wound #2 - Ankle Wound Laterality: Left, Medial Cleanser: Normal Saline 1 x Per Week/30 Days Discharge Instructions: Cleanse the wound with Normal Saline prior to applying [name] clean dressing using gauze sponges, not  tissue or cotton balls. Peri-Wound Care: Zinc Oxide Ointment 30g tube 1 x Per Week/30 Days Discharge Instructions: Apply Zinc Oxide to periwound with each dressing change Peri-Wound Care: Sween Lotion (Moisturizing lotion) 1 x Per Week/30 Days Discharge Instructions: Apply moisturizing lotion as directed Topical: Gentamicin 1 x Per Week/30 Days Discharge Instructions: As directed by physician Topical: Mupirocin Ointment 1 x Per Week/30 Days Discharge Instructions: Apply Mupirocin (Bactroban) as instructed Prim Dressing:  Promogran Prisma Matrix, 4.34 (sq in) (silver collagen) 1 x Per Week/30 Days ary Discharge Instructions: Moisten collagen with saline or hydrogel Secondary Dressing: Woven Gauze Sponge, Non-Sterile 4x4 in 1 x Per Week/30 Days Discharge Instructions: Apply over primary dressing as directed. Secured With: Transpore Surgical Tape, 2x10 (in/yd) 1 x Per Week/30 Days [name], [name] [phone].pdf Page 6 of 12 Discharge Instructions: Secure dressing with tape as directed. Compression Wrap: Urgo K2 Lite, (equivalent to [name] 3 layer) two layer compression system, regular 1 x Per Week/30 Days Discharge Instructions: Apply Urgo K2 Lite as directed (alternative to 3 layer compression). Electronic Signature(s) Signed: 12/26/2022 1:58:49 PM By: [name] MD FACS Entered By: [name] on 12/26/2022 13:53:08 -------------------------------------------------------------------------------- Problem List Details Patient Name: Date of Service: NA Colin [name]. 12/26/2022 1:15 PM Medical Record Number: [id number] Patient Account Number: [id number] Date of Birth/Sex: Treating RN: [birth year] (67 y.o. 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 07/04/2022 No Yes L97.522 Non-pressure chronic ulcer of other part of left foot with fat layer exposed 12/05/2022 No Yes I73.9 Peripheral vascular disease, unspecified 07/04/2022 No Yes E11.622 Type 2 diabetes mellitus with other skin ulcer 07/04/2022 No Yes Inactive Problems Resolved Problems Electronic Signature(s) Signed: 12/26/2022 1:49:07 PM By: [name] MD FACS Previous Signature: 12/26/2022 1:40:33 PM Version By: [name] MD  FACS Entered By: [name] on 12/26/2022 13:49:07 -------------------------------------------------------------------------------- Progress Note Details Patient Name: Date of Service: NA [name]. 12/26/2022 1:15 PM Medical Record Number: [id number] Patient Account Number: [id number] Date of Birth/Sex: Treating RN: [birth year] (67 y.o. M) Primary Care Provider: [name] Other Clinician: Referring Provider: Treating Provider/Extender: Rowland, Ericsson [name] ([id number]) [id number] Page 7 of 12 Weeks in Treatment: 25 Subjective Chief Complaint Information obtained from Patient Patients presents for treatment of an open diabetic ulcer with exposed bone and osteomyelitis 07/04/2022: Diabetic ankle ulcer History of Present Illness (HPI) ADMISSION 08/25/2021 This is [name] 67 year old man who initially presented to his primary care provider in September 2022 with pain in his left foot. He was sent for an x-ray and while the x-ray was being performed, the tech pointed out [name] wound on his foot that the patient was not aware existed. He does have type 2 diabetes with significant neuropathy. His diabetes is suboptimally controlled with his most recent A1c being 8.5. He also has [name] history of coronary artery disease status post three- vessel CABG. he was initially seen by orthopedics, but they referred him to [facility]. He has undergone at least 7 operations/debridements and several applications of skin substitute under the care of podiatry. He has been in [name] for much of this time. His most recent procedure was July 28, 2021. [name] was biopsied and was found to be consistent with osteomyelitis.  Culture also returned positive for corynebacterium. He was seen on August 16, 2021 by infectious disease. [name] PICC line has been placed and he will be receiving [name] 6-week course of IV daptomycin and cefepime.  In October 2022, he underwent lower extremity vascular studies. Results are copied here: Right: Resting right ankle-brachial index is within normal range. No evidence of significant right lower extremity arterial disease. The right toe-brachial index is abnormal. Left: Resting left ankle-brachial index indicates mild left lower extremity arterial disease. The left toe-brachial index is abnormal. He has not been seen by vascular surgery despite these findings. He presented to clinic today in [name] and is using [name] to offload. Wound VAC was in place. Once this was removed, [name] large ulcer was identified on the left midfoot/ankle. Bone is frankly exposed. There is no malodorous or purulent drainage. There is some granulation tissue over the central portion of the exposed bone. There is [name] tunnel that extends posteriorly for roughly 10 cm. It has been discussed with him by multiple providers that he is at very high risk of losing his lower leg because of this wound. He is extremely eager to avoid this outcome and is here today to review his options as well as receive ongoing wound care. 09/03/2021: Here for reevaluation of his wound. There does not appear to have been any substantial improvement overall since our last visit. He has been in [name] with white foam overlying the exposed bone. We are working on getting him approved for hyperbaric oxygen therapy. 09/10/2021: We are in the process of getting him cleared to begin hyperbaric oxygen therapy. He still needs to obtain [name] chest x-ray. Although the wound measurements are roughly the same, I think the overall appearance of the wound is better. The exposed bone has [name] bit more granulation tissue covering it. He has not received [name] vascular surgery appointment to reevaluate his flow to the wound. 09/17/2021: He has been approved for hyperbaric oxygen therapy and completed his chest x-ray, which I reviewed and it appears normal. The tunnels  at the 12 and 10:00 positions are smaller. There is more granulation tissue covering the exposed bone and the undermining has decreased. He still has not received [name] vascular surgery appointment. 09/24/2021: He initiated hyperbaric oxygen therapy this week and is tolerating it well. He has an appointment with vascular surgery coming up on May 16. The granulation tissue is covering more of the exposed bone and both tunnels are [name] bit smaller. 10/01/2021: He continues to tolerate hyperbaric oxygen therapy. He saw infectious disease and they are planning to pull his PICC line. He has been initiated on oral antibiotics (doxycycline and Augmentin). The wound looks about the same but the tunnels are [name] little bit smaller. The skin seems to be contracting somewhat around the exposed bone. 10/08/2021: The wound is still about the same size, but the tunnels continue to come in and the skin is contracting around the exposed bone. He continues to have some accumulation of necrotic material in the inferoposterior aspect of the wound as well as accumulation at the 12:00 tunnel area. 10/15/2021: The wound is smaller today. The tunnels continue to come in. There is less necrotic tissue present. He does have some periwound maceration. 10/22/2021: The wound is about the same size. There is [name] little bit less undermining at the distal portion. The exposed bone is dark and I am not sure if this is staining from silver nitrate or his VAC  sponge or if it represents necrosis. The tunnels are shallower but he does have some serous drainage coming from the 10:00 tunnel. He continues to tolerate hyperbaric oxygen therapy well. 10/29/2021: The undermining continues to improve. The tunnels are about the same. He has good granulation tissue overlying the majority of the exposed bone. It does appear that perhaps the tubing from his wound VAC has been eroding the skin at the 12 clock position. He continues to accumulate senescent epithelium  around the borders of the wound. 11/05/2021: The undermining is almost completely resolved. The tunnels have contracted fairly significantly. No significant slough or debris accumulation. There is still senescent epithelium accumulation around the borders of the wound. He has been tolerating hyperbaric oxygen therapy well. 11/12/2021: Despite the measurements of the wound being about the same, the wound has changed in its shape and overall, I think it is improved. The undermining has resolved and the tunnels continue to shorten. There is good granulation tissue encroaching over the small area of bone that has remained exposed at the 12 o'clock position. Minimal slough accumulation. He continues to tolerate hyperbaric oxygen therapy well. 11/19/2021: I took [name] PCR culture last week. There was overgrowth of yeast. He is already taking suppressive doxycycline and Augmentin. I added fluconazole to his regimen. The wound is smaller again today. The tunnels continue to shorten. He continues to do well with hyperbaric oxygen therapy. 11/26/2021: For some reason, his foot has become macerated. The wound is narrower but about the same dimensions in its longitudinal aspect. The tunnels continue to shorten. He has some slough buildup on the wound as well as some heaped up senescent epithelium around the perimeter. 12/03/2021: No further maceration of his foot has occurred. The wound has contracted quite significantly from last week. The tunnel at 10:00 is closed. The tunnel at 12:00 is down to just [name] couple of millimeters. No other undermining is present. There is soft tissue coverage of the previously exposed bone. There is just [name] bit of slough and biofilm on the wound surface. 12/10/2021: The wound is looking good. It turns out the tunnel at 12:00 is only exposed when the patient dorsiflexes his foot. It is about 2 cm in depth when he does this; when his foot is in plantarflexion, the tunnel is closed. The bone that was  visible at the 12:00 tunnel is completely covered with granulation tissue, but there does feel like some exposed bone deeper into the tunnel area. There is senescent skin heaped up around the periphery. Minimal slough on the wound surface. 12/16/2021: The wound dimensions are roughly the same. The surface has nice granulation tissue. The exposed bone at the 12:00 tunnel continues to be covered [name], [name] ([id number]) [id number] Page 8 of 12 with more soft tissue. 7/14; patient's wound measures smaller today. Using the wound VAC with underlying collagen. He is also being treated with HBO for underlying osteomyelitis. He tells me he is on doxycycline and ampicillin follows with infectious disease next week 12/31/2021: The wound continues to contract. Unfortunately, the area where the track pad and tubing have been rubbing continues to look like it is applying friction. He says that the home health nurses that have been applying the [facility] have been putting gauze underneath the tubing, but nonetheless there is ongoing tissue breakdown at this site. Light slough accumulation on the wound surface. The tunnel continues to contract. He is tolerating HBO without difficulty. 01/07/2022: Bridging the wound VAC away from the ankle has resulted in  significant improvement in the tissue at the apex of the wound. The tunnel is still present and is not all that much shorter, but the overall wound surface is very robust and healthy looking. Minimal slough accumulation. No concern for acute infection. 01/21/2022: The wound continues to contract and has [name] robust granulation tissue surface. The tunnel has come in considerably and is down to about 1.4 cm. There is still bone exposed within the tunnel but the rest of it is well covered. There is some senescent epithelium at the wound margins and [name] little bit of slough on the surface. 01/28/2022: No significant change in the wound this week, but  there has not been any reaccumulation of senescent epithelium or slough. The tunnel is perhaps [name] millimeter less in depth. He has been approved for Apligraf and we will apply this today. 02/04/2022: The wound has contracted somewhat and the tunnel has filled in completely. The wound surface is clean. He is here for Apligraf #2. 02/11/2022: The wound has contracted further and is now nearly flush with the surrounding skin surface. Light layer of slough. Apligraf #3 plan for today. 02/18/2022: There is [name] band of epithelium trying to cut across the superior portion of the wound. There is robust granulation tissue with just [name] light layer of slough and biofilm on the surface. There was [name] little bit of greenish drainage in the wound VAC but none appreciated on the site itself. He is here for Apligraf #4. 03/04/2022: The wound has contracted considerably. There is good granulation tissue on the surface. Minimal biofilm. He is here for Apligraf #5. 03/18/2022: The wound has epithelialized to the point that it has been divided into 2 areas. Both areas are smaller in total than at his previous visit. There is good granulation tissue present with just [name] little bit of periwound eschar and surface slough. 10/13; left medial foot. The patient has completed treatment with hyperbaric oxygen for underlying osteomyelitis he has had [name] nice response in the wound. Noted today that he still had some greenish drainage. Previously he has been treated with topical gentamicin but apparently that was stopped 2 weeks ago. Fortunately the wound is measuring smaller 10/20; wound looks better no hypergranulation minimal drainage. Granulation tissue looks healthy using gentamicin Hydrofera Blue under compression 04/08/2022: The wound continues to contract tremendously. The response to his treatment for hypertrophic granulation tissue has been quite remarkable. There are just [name] couple of open areas remaining. 04/18/2022: His wound is  healed. READMISSION 07/04/2022: He returns to clinic with [name] new ulcer adjacent to where his previous large wound had been. He says that he was wearing boots and using [name] small padded dressing to protect the freshly healed ankle wound. Apparently the dressing rolled up and rubbed on his skin, causing [name] new wound. He contacted our office last week concerned about the appearance. There is [name] small oval wound on his left medial ankle. It is fairly clean with some periwound dry skin and eschar. 07/11/2022: The wound is about the same size this week. There is [name] layer of rubbery slough on the surface. It has [name] fairly strong odor. 07/18/2022: The wound measured larger this week due to small satellite areas opening in [name] fan pattern distal to the primary wound. He continues to accumulate slough on the wound surface. The culture that I took last week only grew out low levels of skin flora. 07/25/2022: The satellite areas that had opened last week have closed. The main wound is smaller  with some slough accumulation. 08/01/2022: The wound continues to contract. There is [name] layer of slough on the surface. 08/08/2022: The wound is [name] little bit smaller again this week. Still with slough buildup. 3/18; patient presents for follow-up. He has been doing dressing changes on his own with silver alginate and antibiotic ointment. He has been at [employer] over the past 3 weeks. He has developed [name] new wound. 09/05/2022: I am not sure if the new wound that was reported last week is just the satellite area that had closed [name] couple of weeks ago and reopened. Regardless, both wounds are quite small with good epithelialization. There is some eschar buildup around the periphery of both wounds with slough on both wound surfaces. 09/12/2022: The wounds have converged. There is some increased in depth at the distal portion. This seems to be secondary to moisture accumulation as the periwound skin is [name] bit macerated. 09/26/2022: The wound has  deteriorated further. There is increased depth at the distal portion and the wound probes to bone. The patient theorizes that the boot he has been wearing is rubbing in this spot and upon inspection of the boot, there is actually [name] hard plastic boot in exactly this location, supporting his theory. 10/05/2022: The wound looks [name] little bit better this week. The area that probed to bone now has some tissue coverage. There is still some slough buildup on the wound surfaces. 10/10/2022: The wound is strongly malodorous today. There is no purulent drainage, nor any increased erythema. There is some tissue maceration around the wound, but he did not have [name] whole lot of drainage on his dressings. There is slough on the wound surface. 10/17/2022: The wound still has [name] fairly strong odor and the drainage has increased. It is goopy and purulent. The culture that I took last week returned late on Thursday with methicillin and tetracycline resistant staph epidermidis and multiple other staph species at fairly high concentrations. 10/27/2022: The wound has cleaned up considerably. There is no odor and less drainage. The drainage is no longer purulent. He is taking Bactrim as prescribed. 11/03/2022: He completed his course of Bactrim however the wound has deteriorated, has [name] foul odor, and there are fragments of bone breaking off in his wound. His drainage was dark and purulent. 11/08/2022: As expected, the bone biopsy returned with osteomyelitis and osteonecrosis. His culture returned with [name] number of species with the Levaquin I had already prescribed being along with the first-line choice as suggested by the pharmacist. Notably, however, Pseudomonas aeruginosa was not among the species cultivated. He has an appointment to see Dr. Daiva Eves on Wednesday the 19th; he has vascular follow-up on Thursday the sixth. The wound actually looks [name] little bit better today. There are no chunks of bone falling out, nor am I actually able  to probe or palpate bone. He did have [name] bit more drainage, but this was noted as murky or purulent as at his previous visit. There is slough buildup on the wound surface and the granulation tissue present is [name] little bit [name], [name] [phone].pdf Page 9 of 12 hypertrophic and friable. 11/15/2022: The wound actually looks [name] little bit better today. I can still palpate bone with his foot in [name] relaxed, plantarflexed position, but there does seem to be some tissue covering it. The drainage has improved. He has completed the oral levofloxacin that I prescribed. He is going to see vascular surgery on Thursday and has an appointment with infectious disease  on Wednesday the 19th. 11/21/2022: There is just [name] tiny little spike of bone that is still palpable. Everything else looks improved. He is having an angiogram next Friday to see if he can be optimized from an arterial inflow perspective. 11/28/2022: The bone is completely covered with soft tissue. The wound is smaller. 12/05/2022: He had his angiogram last week and 3 vessels in his lower leg was stented. The ankle wound is smaller again today with minimal slough accumulation. He managed to tear his toenail off of his fourth toe today when he was removing his socks. This has left [name] wound with the nailbed/fat layer exposed. 12/12/2022: The wound was measured the same size with [name] little bit more depth to it today, but visual inspection suggest that it is smaller. There is hypertrophic granulation tissue present. The site on his fourth toe is smaller with some eschar and slough accumulation. 12/19/2022: The nailbed is healed. The wound on his ankle is down to just [name] tiny narrow slit with some slough on the surface. 12/29/2022: His wound is nearly healed. He met with infectious disease today and both his CRP and sed rate were down significantly from prior and they did not feel he needed any additional antibiotic therapy. He  has follow-up with vascular surgery on Friday. Patient History Information obtained from Patient. Family History Cancer - Father, Diabetes - Father,Mother,Paternal Grandparents, Heart Disease - Father, Hypertension - Father, No family history of Hereditary Spherocytosis, Kidney Disease, Lung Disease, Seizures, Stroke, Thyroid Problems, Tuberculosis. Social History Never smoker, Marital Status - Married, Alcohol Use - Rarely, Drug Use - No History, Caffeine Use - Daily. Medical History Eyes Patient has history of Cataracts - Removed 2008 Cardiovascular Patient has history of Coronary Artery Disease, Hypertension, Myocardial Infarction, Peripheral Arterial Disease Endocrine Patient has history of Type II Diabetes Musculoskeletal Patient has history of Osteomyelitis Neurologic Patient has history of Neuropathy Medical [name] Surgical History Notes nd Cardiovascular Hypercholesterolemia Abnormal EKG CABG X3 2019 Gastrointestinal GERD Musculoskeletal Diabetic foot ulcer Objective Constitutional Slightly hypertensive. no acute distress. Vitals Time Taken: 1:15 AM, Height: 74 in, Weight: 187 lbs, BMI: 24, Temperature: 98.7 F, Pulse: 74 bpm, Respiratory Rate: 20 breaths/min, Blood Pressure: 144/73 mmHg, Capillary Blood Glucose: 125 mg/dl. General Notes: glucose per pt report this am Respiratory Normal work of breathing on room air. General Notes: 12/26/2022: The wound is very nearly healed. There is [name] minuscule opening and [name] little bit of light slough on the surface. No concern for infection. Integumentary (Hair, Skin) Wound #2 status is Open. Original cause of wound was Pressure Injury. The date acquired was: 06/13/2022. The wound has been in treatment 25 weeks. The wound is located on the Left,Medial Ankle. The wound measures 0.1cm length x 0.1cm width x 0.1cm depth; 0.008cm^2 area and 0.001cm^3 volume. There is Fat Layer (Subcutaneous Tissue) exposed. There is no tunneling or undermining  noted. There is [name] none present amount of drainage noted. The wound margin is distinct with the outline attached to the wound base. There is no granulation within the wound bed. There is no necrotic tissue within the wound bed. The periwound skin appearance had no abnormalities noted for texture. The periwound skin appearance had no abnormalities noted for moisture. The periwound skin appearance had no abnormalities noted for color. Periwound temperature was noted as No Abnormality. 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 There was [name] Selective/Open Wound Non-Viable Tissue Debridement with [name] total area of 0.05 sq cm performed by [name], MD. With the following instrument(s): Curette to remove Non-Viable tissue/material. Material removed includes [facility] after achieving pain control using Lidocaine 4% T opical Solution. No specimens were taken. [name] time out was conducted at 13:40, prior to the start of the procedure. [name] Minimum amount of bleeding was controlled with Pressure. The procedure was tolerated well with [name] pain level of 0 throughout and [name] pain level of 0 following the procedure. Post Debridement Measurements: 0.2cm length x 0.3cm width x 0.1cm depth; 0.005cm^3 volume. Character of Wound/Ulcer Post Debridement is improved. Severity of Tissue Post Debridement is: Fat layer exposed. Post procedure Diagnosis Wound #2: Same as Pre-Procedure General Notes: scribed for Dr. Lady Gary by Zenaida Deed,  RN. Pre-procedure diagnosis of Wound #2 is [name] Diabetic Wound/Ulcer of the Lower Extremity located on the Left,Medial Ankle . There was [name] Double Layer Compression Therapy Procedure by Zenaida Deed, RN. Post procedure Diagnosis Wound #2: Same as Pre-Procedure Notes: urgo lite. Plan Follow-up Appointments: Return Appointment in 1 week. - Dr. Lady Gary - room 2 Monday 7/22 @ 3:00 pm Anesthetic: (In clinic) Topical Lidocaine 4% applied to wound bed Bathing/ Shower/ Hygiene: May shower with protection but do not get wound dressing(s) wet. Protect dressing(s) with water repellant cover (for example, large plastic bag) or [name] cast cover and may then take shower. Edema Control - Lymphedema / SCD / Other: Elevate legs to the level of the heart or above for 30 minutes daily and/or when sitting for 3-4 times [name] day throughout the day. Avoid standing for long periods of time. Off-Loading: Open toe surgical shoe to: - left foot Other: - knee scooter to left leg, stop wearing your orthoboot WOUND #2: - Ankle Wound Laterality: Left, Medial Cleanser: Normal Saline 1 x Per Week/30 Days Discharge Instructions: Cleanse the wound with Normal Saline prior to applying [name] clean dressing using gauze sponges, not tissue or cotton balls. Peri-Wound Care: Zinc Oxide Ointment 30g tube 1 x Per Week/30 Days Discharge Instructions: Apply Zinc Oxide to periwound with each dressing change Peri-Wound Care: Sween Lotion (Moisturizing lotion) 1 x Per Week/30 Days Discharge Instructions: Apply moisturizing lotion as directed Topical: Gentamicin 1 x Per Week/30 Days Discharge Instructions: As directed by physician Topical: Mupirocin Ointment 1 x Per Week/30 Days Discharge Instructions: Apply Mupirocin (Bactroban) as instructed Prim Dressing: Promogran Prisma Matrix, 4.34 (sq in) (silver collagen) 1 x Per Week/30 Days ary Discharge Instructions: Moisten collagen with saline or hydrogel Secondary Dressing: Woven Gauze Sponge,  Non-Sterile 4x4 in 1 x Per Week/30 Days Discharge Instructions: Apply over primary dressing as directed. Secured With: Transpore Surgical T ape, 2x10 (in/yd) 1 x Per Week/30 Days Discharge Instructions: Secure dressing with tape as directed. Com pression Wrap: Urgo K2 Lite, (equivalent to [name] 3 layer) two layer compression system, regular 1 x Per Week/30 Days Discharge Instructions: Apply Urgo K2 Lite as directed (alternative to 3 layer compression). 12/26/2022: The wound is very nearly healed. There is [name] minuscule opening and [name] little bit of light slough on the surface. No concern for infection. I used [name] curette to debride the slough from the wound. We will  continue mixture of topical gentamicin and mupirocin with Prisma silver collagen and Urgo light compression. I anticipate he will likely be completely healed at his visit next week. Electronic Signature(s) Signed: 12/26/2022 1:53:46 PM By: [name] MD FACS [name] [id number] Page 11 of 12 Entered By: [name] on 12/26/2022 13:53:46 -------------------------------------------------------------------------------- HxROS Details Patient Name: Date of Service: NA Colin [name]. 12/26/2022 1:15 PM Medical Record Number: [id number] Patient Account Number: [id number] Date of Birth/Sex: Treating RN: June 08, 1956 (67 y.o. M) Primary Care Provider: [name] Other Clinician: Referring Provider: Treating Provider/Extender: [name] Weeks in Treatment: 25 Information Obtained From Patient Eyes Medical History: Positive for: Cataracts - Removed 2008 Cardiovascular Medical History: Positive for: Coronary Artery Disease; Hypertension; Myocardial Infarction; Peripheral Arterial Disease Past Medical History Notes: Hypercholesterolemia Abnormal EKG CABG X3 2019 Gastrointestinal Medical History: Past Medical History Notes: GERD Endocrine Medical  History: Positive for: Type II Diabetes Time with diabetes: 24 years Treated with: Insulin, Oral agents Blood sugar tested every day: Yes Tested : Musculoskeletal Medical History: Positive for: Osteomyelitis Past Medical History Notes: Diabetic foot ulcer Neurologic Medical History: Positive for: Neuropathy HBO Extended History Items Eyes: Cataracts Immunizations Pneumococcal Vaccine: Received Pneumococcal Vaccination: Yes Received Pneumococcal Vaccination On or After 60th Birthday: Yes Implantable Devices Yes Family and Social History Cancer: Yes - Father; Diabetes: Yes - Father,Mother,Paternal Grandparents; Heart Disease: Yes - Father; Hereditary Spherocytosis: No; Hypertension: Yes [name], [name] ([id number]) [id number] Page 12 of 12 - Father; Kidney Disease: No; Lung Disease: No; Seizures: No; Stroke: No; Thyroid Problems: No; Tuberculosis: No; Never smoker; Marital Status - Married; Alcohol Use: Rarely; Drug Use: No History; Caffeine Use: Daily; Financial Concerns: No; Food, Clothing or Shelter Needs: No; Support System Lacking: No; Transportation Concerns: No Electronic Signature(s) Signed: 12/26/2022 1:58:49 PM By: [name] MD FACS Entered By: [name] on 12/26/2022 13:52:06 -------------------------------------------------------------------------------- SuperBill Details Patient Name: Date of Service: NA [name]. 12/26/2022 Medical Record Number: [id number] Patient Account Number: [id number] Date of Birth/Sex: Treating RN: 04-25-1956 (67 y.o. 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 Modifier: Quantity: 1 Physician Procedures : CPT4 Code Description Modifier [id number] 99214 - WC PHYS LEVEL 4 - EST PT 25 ICD-10 Diagnosis Description L97.324 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

## 2022-12-26 NOTE — Patient Instructions (Signed)
Nice to see you.  No antibiotics at this point.   Continue with wound care and protein intake.  Follow up with ID as needed.   Have a great day and stay safe!

## 2022-12-30 ENCOUNTER — Ambulatory Visit (HOSPITAL_COMMUNITY): Payer: BC Managed Care – PPO

## 2023-01-02 ENCOUNTER — Encounter (HOSPITAL_BASED_OUTPATIENT_CLINIC_OR_DEPARTMENT_OTHER): Payer: BC Managed Care – PPO | Admitting: General Surgery

## 2023-01-02 DIAGNOSIS — Z794 Long term (current) use of insulin: Secondary | ICD-10-CM | POA: Diagnosis not present

## 2023-01-02 DIAGNOSIS — E1151 Type 2 diabetes mellitus with diabetic peripheral angiopathy without gangrene: Secondary | ICD-10-CM | POA: Diagnosis not present

## 2023-01-02 DIAGNOSIS — L97522 Non-pressure chronic ulcer of other part of left foot with fat layer exposed: Secondary | ICD-10-CM | POA: Diagnosis not present

## 2023-01-02 DIAGNOSIS — Z833 Family history of diabetes mellitus: Secondary | ICD-10-CM | POA: Diagnosis not present

## 2023-01-02 DIAGNOSIS — L97328 Non-pressure chronic ulcer of left ankle with other specified severity: Secondary | ICD-10-CM | POA: Diagnosis not present

## 2023-01-02 DIAGNOSIS — Z7984 Long term (current) use of oral hypoglycemic drugs: Secondary | ICD-10-CM | POA: Diagnosis not present

## 2023-01-02 DIAGNOSIS — Z951 Presence of aortocoronary bypass graft: Secondary | ICD-10-CM | POA: Diagnosis not present

## 2023-01-02 DIAGNOSIS — E11621 Type 2 diabetes mellitus with foot ulcer: Secondary | ICD-10-CM | POA: Diagnosis not present

## 2023-01-02 DIAGNOSIS — Z8249 Family history of ischemic heart disease and other diseases of the circulatory system: Secondary | ICD-10-CM | POA: Diagnosis not present

## 2023-01-02 DIAGNOSIS — E11622 Type 2 diabetes mellitus with other skin ulcer: Secondary | ICD-10-CM | POA: Diagnosis not present

## 2023-01-02 DIAGNOSIS — I2510 Atherosclerotic heart disease of native coronary artery without angina pectoris: Secondary | ICD-10-CM | POA: Diagnosis not present

## 2023-01-02 NOTE — Progress Notes (Signed)
Colin [name], Colin [name] [name] ([id number]) [id number] Page 1 of 7 Visit Report for 01/02/2023 Arrival Information Details Patient Name: Date of Service: NA Colin [name] 01/02/2023 3:00 PM Medical Record Number: [id number] Patient Account Number: [id number] Date of Birth/Sex: Treating RN: 1956-04-13 (67 y.o. Colin [name] Primary Care Colin [name]: Colin [name] Other Clinician: Referring Colin [name]: Treating Colin [name]/Extender: Colin [name] in Treatment: 26 Visit Information History Since Last Visit Added or deleted any medications: No Patient Arrived: Knee Scooter Any new allergies or adverse reactions: No Arrival Time: 14:58 Had [name] fall or experienced change in No Accompanied By: self activities of daily living that may affect Transfer Assistance: None risk of falls: Patient Identification Verified: Yes Signs or symptoms of abuse/neglect since last visito No Secondary Verification Process Completed: Yes Hospitalized since last visit: No Patient Requires Transmission-Based Precautions: No Implantable device outside of the clinic excluding No Patient Has Alerts: No cellular tissue based products placed in the center since last visit: Has Dressing in Place as Prescribed: Yes Has Compression in Place as Prescribed: Yes Pain Present Now: No Electronic Signature(s) Signed: 01/02/2023 4:45:25 PM By: Colin [name] Entered By: Colin [name] on 01/02/2023 14:59:09 -------------------------------------------------------------------------------- Clinic Level of Care Assessment Details Patient Name: Date of Service: Colin [name]. 01/02/2023 3:00 PM Medical Record Number: [id number] Patient Account Number: [id number] Date of Birth/Sex: Treating RN: 1956/04/11 (67 y.o. Colin [name] Primary Care Colin [name]: Colin [name] Other Clinician: Referring Colin [name]: Treating Colin [name]/Extender: Colin [name] in Treatment:  26 Clinic Level of Care Assessment Items TOOL 4 Quantity Score X- 1 0 Use when only an EandM is performed on FOLLOW-UP visit ASSESSMENTS - Nursing Assessment / Reassessment []  - 0 Reassessment of Co-morbidities (includes updates in patient status) X- 1 5 Reassessment of Adherence to Treatment Plan ASSESSMENTS - Wound and Skin [name] / Reassessment X - Simple Wound Assessment / Reassessment - one wound 1 5 []  - 0 Complex Wound Assessment / Reassessment - multiple wounds []  - 0 Dermatologic / Skin Assessment (not related to wound area) ASSESSMENTS - Focused Assessment X- 1 5 Circumferential Edema Measurements - multi extremities []  - 0 Nutritional Assessment / Counseling / Intervention Colin, Colin [name] ([id number]) [id number] Page 2 of 7 X- 1 5 Lower Extremity Assessment (monofilament, tuning fork, pulses) []  - 0 Peripheral Arterial Disease Assessment (using hand held doppler) ASSESSMENTS - Ostomy and/or Continence Assessment and Care []  - 0 Incontinence Assessment and Management []  - 0 Ostomy Care Assessment and Management (repouching, etc.) PROCESS - Coordination of Care X - Simple Patient / Family Education for ongoing care 1 15 []  - 0 Complex (extensive) Patient / Family Education for ongoing care X- 1 10 Staff obtains Chiropractor, Records, T Results / Process Orders est []  - 0 Staff telephones HHA, Nursing Homes / Clarify orders / etc []  - 0 Routine Transfer to another Facility (non-emergent condition) []  - 0 Routine Hospital Admission (non-emergent condition) []  - 0 New Admissions / Manufacturing engineer / Ordering NPWT Apligraf, etc. , []  - 0 Emergency Hospital Admission (emergent condition) X- 1 10 Simple Discharge Coordination []  - 0 Complex (extensive) Discharge Coordination PROCESS - Special Needs []  - 0 Pediatric / Minor Patient Management []  - 0 Isolation Patient Management []  - 0 Hearing / Language / Visual special  needs []  - 0 Assessment of Community assistance (transportation, D/C planning, etc.) []  - 0 Additional assistance / Altered mentation []  - 0 Support Surface(s) Assessment (bed, cushion, seat, etc.) INTERVENTIONS -  Wound Cleansing / Measurement X - Simple Wound Cleansing - one wound 1 5 []  - 0 Complex Wound Cleansing - multiple wounds X- 1 5 Wound Imaging (photographs - any number of wounds) []  - 0 Wound Tracing (instead of photographs) []  - 0 Simple Wound Measurement - one wound []  - 0 Complex Wound Measurement - multiple wounds INTERVENTIONS - Wound Dressings X - Small Wound Dressing one or multiple wounds 1 10 []  - 0 Medium Wound Dressing one or multiple wounds []  - 0 Large Wound Dressing one or multiple wounds []  - 0 Application of Medications - topical []  - 0 Application of Medications - injection INTERVENTIONS - Miscellaneous []  - 0 External ear exam []  - 0 Specimen Collection (cultures, biopsies, blood, body fluids, etc.) []  - 0 Specimen(s) / Culture(s) sent or taken to Lab for analysis []  - 0 Patient Transfer (multiple staff / Nurse, adult / Similar devices) []  - 0 Simple Staple / Suture removal (25 or less) []  - 0 Complex Staple / Suture removal (26 or more) []  - 0 Hypo / Hyperglycemic Management (close monitor of Blood Glucose) Colin [name], Colin [name] ([id number]) [id number] Page 3 of 7 []  - 0 Ankle / Brachial Index (ABI) - do not check if billed separately X- 1 5 Vital Signs Has the patient been seen at the hospital within the last three years: Yes Total Score: 80 Level Of Care: New/Established - Level 3 Electronic Signature(s) Signed: 01/02/2023 4:45:25 PM By: Colin [name] Entered By: Colin [name] on 01/02/2023 15:55:10 -------------------------------------------------------------------------------- Encounter Discharge Information Details Patient Name: Date of Service: NA [name] Colin [name]. 01/02/2023 3:00 PM Medical  Record Number: [id number] Patient Account Number: [id number] Date of Birth/Sex: Treating RN: [birth year] (67 y.o. Colin [name] Primary Care Qualyn Oyervides: Colin [name] Other Clinician: Referring Lusia Greis: Treating Myeasha Ballowe/Extender: Colin [name] in Treatment: 26 Encounter Discharge Information Items Discharge Condition: Stable Ambulatory Status: Knee Scooter Discharge Destination: Home Transportation: Private Auto Accompanied By: self Schedule Follow-up Appointment: No Clinical Summary of Care: Patient Declined Electronic Signature(s) Signed: 01/02/2023 4:45:25 PM By: Colin [name] Entered By: Colin [name] on 01/02/2023 15:19:30 -------------------------------------------------------------------------------- Lower Extremity Assessment Details Patient Name: Date of Service: Colin [name]. 01/02/2023 3:00 PM Medical Record Number: [id number] Patient Account Number: [id number] Date of Birth/Sex: Treating RN: 1955/10/11 (67 y.o. Colin [name] Primary Care Ottilia Pippenger: Colin [name] Other Clinician: Referring Stefon Ramthun: Treating Aftyn Nott/Extender: [name]: 26 Edema Assessment Assessed: [Left: No] [Right: No] Edema: [Left: Ye] [Right: s] Calf Left: Right: Point of Measurement: From Medial Instep 33 cm Ankle Left: Right: Point of Measurement: From Medial Instep 22 cm Vascular Assessment Left: [[id number] Page 4 of 7Right:] Pulses: Dorsalis Pedis Palpable: [[id number] Page 4 of 7Yes] Extremity colors, hair growth, and conditions: Extremity Color: [[id number] Page 4 of 7Normal] Hair Growth on Extremity: [[id number] Page 4 of 7Yes] Temperature of Extremity: [[id number] Page 4 of 7Warm < 3 seconds] Electronic Signature(s) Signed: 01/02/2023 4:45:25 PM By: Colin [name] Entered By: Colin [name] on 01/02/2023 15:05:43 -------------------------------------------------------------------------------- Multi Wound Chart Details Patient Name: Date of Service: NA Colin [name]. 01/02/2023 3:00 PM Medical Record Number: [id number] Patient Account Number: [id number] Date of Birth/Sex: Treating RN: 03/24/1956 (67 y.o. M) Primary Care Tahliyah Anagnos: Colin [name] Other Clinician: Referring Shafer Swamy: Treating Amire Gossen/Extender: [name]: 26 Vital Signs Height(in): 74 Capillary Blood Glucose(mg/dl): 093 Weight(lbs): 235 Pulse(bpm): 74 Body Mass Index(BMI): 24 Blood Pressure(mmHg): 150/85 Temperature(F): 97.9 Respiratory Rate(breaths/min):  18 [2:Photos:] [N/[name]:N/[name]] Left, Medial Ankle N/[name]/[name] Wound Location: Pressure Injury N/[name]/[name] Wounding Event: Diabetic Wound/Ulcer of the Lower N/[name]/[name] Primary Etiology: Extremity Cataracts, Coronary Artery Disease, N/[name]/[name] Comorbid History: Hypertension, Myocardial Infarction, Peripheral Arterial Disease, Type II Diabetes, Osteomyelitis, Neuropathy 06/13/2022 N/[name]/[name] Date Acquired: 83 N/[name]/[name] Weeks of Treatment: Healed - Epithelialized N/[name]/[name] Wound Status: No N/[name]/[name] Wound Recurrence: 0x0x0 N/[name]/[name] Measurements L x W x D (cm) 0 N/[name]/[name] [name] (cm) : rea 0 N/[name]/[name] Volume (cm) : 100.00% N/[name]/[name] % Reduction in [name] rea: 100.00% N/[name]/[name] % Reduction in Volume: Grade 3 N/[name]/[name] Classification: None Present N/[name]/[name] Exudate [name] mount: Distinct, outline attached N/[name]/[name] Wound Margin: None Present (0%) N/[name]/[name] Granulation [name] mount: None Present (0%) N/[name]/[name] Necrotic [name] mount: Fascia: No N/[name]/[name] Exposed Structures: Fat Layer (Subcutaneous Tissue): No Tendon: No Muscle: No Joint: No Bone: No Large (67-100%) N/[name]/[name] EpithelializationTHOMOS, Colin [name] [id number] Page 5 of 7 No Abnormalities Noted N/[name]/[name] Periwound Skin  Texture: Maceration: No N/[name]/[name] Periwound Skin Moisture: No Abnormalities Noted N/[name]/[name] Periwound Skin Color: No Abnormality N/[name]/[name] Temperature: Treatment Notes Electronic Signature(s) Signed: 01/02/2023 3:15:43 PM By: [name] MD FACS Entered By: [name] on 01/02/2023 15:15:42 -------------------------------------------------------------------------------- Pain Assessment Details Patient Name: Date of Service: NA Colin [name]. 01/02/2023 3:00 PM Medical Record Number: [id number] Patient Account Number: [id number] Date of Birth/Sex: Treating RN: Dec 17, 1955 (67 y.o. Colin [name] Primary Care Liliana Brentlinger: Colin [name] Other Clinician: Referring Taft Worthing: Treating Alfred Eckley/Extender: [name]: 26 Active Problems Location of Pain Severity and Description of Pain Patient Has Paino No Site Locations Rate the pain. Current Pain Level: 0 Pain Management and Medication Current Pain Management: Electronic Signature(s) Signed: 01/02/2023 4:45:25 PM By: Colin [name] Entered By: Colin [name] on 01/02/2023 14:59:19 -------------------------------------------------------------------------------- Patient/Caregiver Education Details Patient Name: Date of Service: NA Colin [name] 7/22/2024andnbsp3:00 PM Medical Record Number: [id number] Patient Account Number: [id number] Date of Birth/Gender: Treating RN: [birth year] (67 y.o. Colin [name] Primary Care Physician: Colin [name] Other Clinician: Referring Physician: Treating Physician/Extender: Griffith, Santilli [name] ([id number]) [id number] Page 6 of 7 Weeks in Treatment: 26 Education Assessment Education Provided To: Patient Education Topics Provided Safety: Methods: Explain/Verbal Responses: Reinforcements needed, State content correctly [employer]) Signed: 01/02/2023 4:45:25 PM By: Colin [name] Entered By: Colin [name] on 01/02/2023 15:14:10 -------------------------------------------------------------------------------- Wound Assessment Details Patient Name: Date of Service: Colin [name]. 01/02/2023 3:00 PM Medical Record Number: [id number] Patient Account Number: [id number] Date of Birth/Sex: Treating RN: 12/31/1955 (67 y.o. Colin [name] Primary Care Niamya Vittitow: Colin [name] Other Clinician: Referring Shahad Mazurek: Treating Yony Roulston/Extender: [name]: 26 Wound Status Wound Number: 2 Primary Diabetic Wound/Ulcer of the Lower Extremity Etiology: Wound Location: Left, Medial Ankle Wound Healed - Epithelialized Wounding Event: Pressure Injury Status: Date Acquired: 06/13/2022 Comorbid Cataracts, Coronary Artery Disease, Hypertension, Myocardial Weeks Of Treatment: 26 History: Infarction, Peripheral Arterial Disease, Type II Diabetes, Clustered Wound: No Osteomyelitis, Neuropathy Photos Wound Measurements Length: (cm) Width: (cm) Depth: (cm) Area: (cm) Volume: (cm) 0 % Reduction in Area: 100% 0 % Reduction in Volume: 100% 0 Epithelialization: Large (67-100%) 0 Tunneling: No 0 Undermining: No Wound Description Classification: Grade 3 Wound Margin: Distinct, outline attached Exudate Amount: None Present Foul Odor After Cleansing: No Slough/Fibrino No Wound Bed Granulation Amount: None Present (0%) Exposed Structure Necrotic  Amount: None Present (0%) Fascia Exposed: No Colin [name], Colin [name] ([id number]) [phone].pdf Page 7 of 7 Fat Layer (Subcutaneous Tissue) Exposed: No Tendon Exposed: No Muscle Exposed: No Joint Exposed: No Bone Exposed: No Periwound Skin Texture Texture Color No Abnormalities Noted: Yes No Abnormalities Noted: Yes Moisture Temperature / Pain No Abnormalities Noted: Yes Temperature: No Abnormality Electronic Signature(s) Signed: 01/02/2023 4:45:25 PM By:  Colin [name] Entered By: Colin [name] on 01/02/2023 15:12:04 -------------------------------------------------------------------------------- Vitals Details Patient Name: Date of Service: NA [name] Colin [name]. 01/02/2023 3:00 PM Medical Record Number: [id number] Patient Account Number: [id number] Date of Birth/Sex: Treating RN: Dec 08, 1955 (67 y.o. Colin [name] Primary Care Jackilyn Umphlett: Colin [name] Other Clinician: Referring Shaye Elling: Treating Rim Thatch/Extender: [name]: 26 Vital Signs Time Taken: 15:01 Temperature (F): 97.9 Height (in): 74 Pulse (bpm): 74 Weight (lbs): 187 Respiratory Rate (breaths/min): 18 Body Mass Index (BMI): 24 Blood Pressure (mmHg): 150/85 Capillary Blood Glucose (mg/dl): 606 Reference Range: 80 - 120 mg / dl Electronic Signature(s) Signed: 01/02/2023 4:45:25 PM By: Colin [name] Entered By: Colin [name] on 01/02/2023 15:02:47

## 2023-01-02 NOTE — Progress Notes (Signed)
JIN, CAPOTE A ([id number]) [id number] Page 1 of 11 Visit Report for 01/02/2023 Chief Complaint Document Details Patient Name: Date of Service: NA [name] 01/02/2023 3:00 PM Medical Record Number: [id number] Patient Account Number: [id number] Date of Birth/Sex: Treating RN: 1956/04/14 (67 y.o. M) Primary Care Provider: [name] Other Clinician: Referring Provider: Treating Provider/Extender: [name] in Treatment: 26 Information Obtained from: Patient Chief Complaint Patients presents for treatment of an open diabetic ulcer with exposed bone and osteomyelitis 07/04/2022: Diabetic ankle ulcer Electronic Signature(s) Signed: 01/02/2023 3:15:52 PM By: [name] MD FACS Entered By: [name] on 01/02/2023 15:15:52 -------------------------------------------------------------------------------- HPI Details Patient Name: Date of Service: NA [name], [name] 01/02/2023 3:00 PM Medical Record Number: [id number] Patient Account Number: [id number] Date of Birth/Sex: Treating RN: 08/13/1955 (67 y.o. M) Primary Care Provider: [name] Other Clinician: Referring Provider: Treating Provider/Extender: [name] in Treatment: 26 History of Present Illness HPI Description: ADMISSION 08/25/2021 This is a 67 year old man who initially presented to his primary care provider in September 2022 with pain in his left foot. He was sent for an x-ray and while the x-ray was being performed, the tech pointed out a wound on his foot that the patient was not aware existed. He does have type 2 diabetes with significant neuropathy. His diabetes is suboptimally controlled with his most recent A1c being 8.5. He also has a history of coronary artery disease status post three- vessel CABG. he was initially seen by orthopedics, but they referred him to [facility]. He has undergone at least  7 operations/debridements and several applications of skin substitute under the care of podiatry. He has been in a wound VAC for much of this time. His most recent procedure was July 28, 2021. A portion of the talus was biopsied and was found to be consistent with osteomyelitis. Culture also returned positive for corynebacterium. He was seen on August 16, 2021 by infectious disease. A PICC line has been placed and he will be receiving a 6-week course of IV daptomycin and cefepime. In October 2022, he underwent lower extremity vascular studies. Results are copied here: Right: Resting right ankle-brachial index is within normal range. No evidence of significant right lower extremity arterial disease. The right toe-brachial index is abnormal. Left: Resting left ankle-brachial index indicates mild left lower extremity arterial disease. The left toe-brachial index is abnormal. He has not been seen by vascular surgery despite these findings. He presented to clinic today in a cam boot and is using a knee scooter to offload. Wound VAC was in place. Once this was removed, a large ulcer was identified on the left midfoot/ankle. Bone is frankly exposed. There is no malodorous or purulent drainage. There is some granulation tissue over the central portion of the exposed bone. There is a tunnel that extends posteriorly for roughly 10 cm. It has been discussed with him by multiple providers that he is at very high risk of losing his lower leg because of this wound. He is extremely eager to avoid this outcome and is here today to review his options as well as receive ongoing wound care. 09/03/2021: Here for reevaluation of his wound. There does not appear to have been any substantial improvement overall since our last visit. He has been in a wound VAC with white foam overlying the exposed bone. We are working on getting him approved for hyperbaric oxygen therapy. MICHIAH, MUDRY A ([id number])  [id number] Page 2  of 11 09/10/2021: We are in the process of getting him cleared to begin hyperbaric oxygen therapy. He still needs to obtain a chest x-ray. Although the wound measurements are roughly the same, I think the overall appearance of the wound is better. The exposed bone has a bit more granulation tissue covering it. He has not received a vascular surgery appointment to reevaluate his flow to the wound. 09/17/2021: He has been approved for hyperbaric oxygen therapy and completed his chest x-ray, which I reviewed and it appears normal. The tunnels at the 12 and 10:00 positions are smaller. There is more granulation tissue covering the exposed bone and the undermining has decreased. He still has not received a vascular surgery appointment. 09/24/2021: He initiated hyperbaric oxygen therapy this week and is tolerating it well. He has an appointment with vascular surgery coming up on May 16. The granulation tissue is covering more of the exposed bone and both tunnels are a bit smaller. 10/01/2021: He continues to tolerate hyperbaric oxygen therapy. He saw infectious disease and they are planning to pull his PICC line. He has been initiated on oral antibiotics (doxycycline and Augmentin). The wound looks about the same but the tunnels are a little bit smaller. The skin seems to be contracting somewhat around the exposed bone. 10/08/2021: The wound is still about the same size, but the tunnels continue to come in and the skin is contracting around the exposed bone. He continues to have some accumulation of necrotic material in the inferoposterior aspect of the wound as well as accumulation at the 12:00 tunnel area. 10/15/2021: The wound is smaller today. The tunnels continue to come in. There is less necrotic tissue present. He does have some periwound maceration. 10/22/2021: The wound is about the same size. There is a little bit less undermining at the distal portion. The  exposed bone is dark and I am not sure if this is staining from silver nitrate or his VAC sponge or if it represents necrosis. The tunnels are shallower but he does have some serous drainage coming from the 10:00 tunnel. He continues to tolerate hyperbaric oxygen therapy well. 10/29/2021: The undermining continues to improve. The tunnels are about the same. He has good granulation tissue overlying the majority of the exposed bone. It does appear that perhaps the tubing from his wound VAC has been eroding the skin at the 12 clock position. He continues to accumulate senescent epithelium around the borders of the wound. 11/05/2021: The undermining is almost completely resolved. The tunnels have contracted fairly significantly. No significant slough or debris accumulation. There is still senescent epithelium accumulation around the borders of the wound. He has been tolerating hyperbaric oxygen therapy well. 11/12/2021: Despite the measurements of the wound being about the same, the wound has changed in its shape and overall, I think it is improved. The undermining has resolved and the tunnels continue to shorten. There is good granulation tissue encroaching over the small area of bone that has remained exposed at the 12 o'clock position. Minimal slough accumulation. He continues to tolerate hyperbaric oxygen therapy well. 11/19/2021: I took a PCR culture last week. There was overgrowth of yeast. He is already taking suppressive doxycycline and Augmentin. I added fluconazole to his regimen. The wound is smaller again today. The tunnels continue to shorten. He continues to do well with hyperbaric oxygen therapy. 11/26/2021: For some reason, his foot has become macerated. The wound is narrower but about the same dimensions in its longitudinal aspect. The tunnels continue  to shorten. He has some slough buildup on the wound as well as some heaped up senescent epithelium around the perimeter. 12/03/2021: No further  maceration of his foot has occurred. The wound has contracted quite significantly from last week. The tunnel at 10:00 is closed. The tunnel at 12:00 is down to just a couple of millimeters. No other undermining is present. There is soft tissue coverage of the previously exposed bone. There is just a bit of slough and biofilm on the wound surface. 12/10/2021: The wound is looking good. It turns out the tunnel at 12:00 is only exposed when the patient dorsiflexes his foot. It is about 2 cm in depth when he does this; when his foot is in plantarflexion, the tunnel is closed. The bone that was visible at the 12:00 tunnel is completely covered with granulation tissue, but there does feel like some exposed bone deeper into the tunnel area. There is senescent skin heaped up around the periphery. Minimal slough on the wound surface. 12/16/2021: The wound dimensions are roughly the same. The surface has nice granulation tissue. The exposed bone at the 12:00 tunnel continues to be covered with more soft tissue. 7/14; patient's wound measures smaller today. Using the wound VAC with underlying collagen. He is also being treated with HBO for underlying osteomyelitis. He tells me he is on doxycycline and ampicillin follows with infectious disease next week 12/31/2021: The wound continues to contract. Unfortunately, the area where the track pad and tubing have been rubbing continues to look like it is applying friction. He says that the home health nurses that have been applying the [facility] have been putting gauze underneath the tubing, but nonetheless there is ongoing tissue breakdown at this site. Light slough accumulation on the wound surface. The tunnel continues to contract. He is tolerating HBO without difficulty. 01/07/2022: Bridging the wound VAC away from the ankle has resulted in significant improvement in the tissue at the apex of the wound. The tunnel is still present and is not all that much shorter, but the  overall wound surface is very robust and healthy looking. Minimal slough accumulation. No concern for acute infection. 01/21/2022: The wound continues to contract and has a robust granulation tissue surface. The tunnel has come in considerably and is down to about 1.4 cm. There is still bone exposed within the tunnel but the rest of it is well covered. There is some senescent epithelium at the wound margins and a little bit of slough on the surface. 01/28/2022: No significant change in the wound this week, but there has not been any reaccumulation of senescent epithelium or slough. The tunnel is perhaps a millimeter less in depth. He has been approved for Apligraf and we will apply this today. 02/04/2022: The wound has contracted somewhat and the tunnel has filled in completely. The wound surface is clean. He is here for Apligraf #2. 02/11/2022: The wound has contracted further and is now nearly flush with the surrounding skin surface. Light layer of slough. Apligraf #3 plan for today. 02/18/2022: There is a band of epithelium trying to cut across the superior portion of the wound. There is robust granulation tissue with just a light layer of slough and biofilm on the surface. There was a little bit of greenish drainage in the wound VAC but none appreciated on the site itself. He is here for Apligraf #4. 03/04/2022: The wound has contracted considerably. There is good granulation tissue on the surface. Minimal biofilm. He is here for Apligraf #5.  03/18/2022: The wound has epithelialized to the point that it has been divided into 2 areas. Both areas are smaller in total than at his previous visit. There is good granulation tissue present with just a little bit of periwound eschar and surface slough. 10/13; left medial foot. The patient has completed treatment with hyperbaric oxygen for underlying osteomyelitis he has had a nice response in the wound. Noted today that he still had some greenish drainage.  Previously he has been treated with topical gentamicin but apparently that was stopped 2 weeks ago. Fortunately the wound is measuring smaller 10/20; wound looks better no hypergranulation minimal drainage. Granulation tissue looks healthy using gentamicin Hydrofera Blue under compression 04/08/2022: The wound continues to contract tremendously. The response to his treatment for hypertrophic granulation tissue has been quite remarkable. There are just a couple of open areas remaining. JAHZIEL, SINN A ([id number]) [id number] Page 3 of 11 04/18/2022: His wound is healed. READMISSION 07/04/2022: He returns to clinic with a new ulcer adjacent to where his previous large wound had been. He says that he was wearing boots and using a small padded dressing to protect the freshly healed ankle wound. Apparently the dressing rolled up and rubbed on his skin, causing a new wound. He contacted our office last week concerned about the appearance. There is a small oval wound on his left medial ankle. It is fairly clean with some periwound dry skin and eschar. 07/11/2022: The wound is about the same size this week. There is a layer of rubbery slough on the surface. It has a fairly strong odor. 07/18/2022: The wound measured larger this week due to small satellite areas opening in a fan pattern distal to the primary wound. He continues to accumulate slough on the wound surface. The culture that I took last week only grew out low levels of skin flora. 07/25/2022: The satellite areas that had opened last week have closed. The main wound is smaller with some slough accumulation. 08/01/2022: The wound continues to contract. There is a layer of slough on the surface. 08/08/2022: The wound is a little bit smaller again this week. Still with slough buildup. 3/18; patient presents for follow-up. He has been doing dressing changes on his own with silver alginate and antibiotic ointment. He has been at  [employer] over the past 3 weeks. He has developed a new wound. 09/05/2022: I am not sure if the new wound that was reported last week is just the satellite area that had closed a couple of weeks ago and reopened. Regardless, both wounds are quite small with good epithelialization. There is some eschar buildup around the periphery of both wounds with slough on both wound surfaces. 09/12/2022: The wounds have converged. There is some increased in depth at the distal portion. This seems to be secondary to moisture accumulation as the periwound skin is a bit macerated. 09/26/2022: The wound has deteriorated further. There is increased depth at the distal portion and the wound probes to bone. The patient theorizes that the boot he has been wearing is rubbing in this spot and upon inspection of the boot, there is actually a hard plastic boot in exactly this location, supporting his theory. 10/05/2022: The wound looks a little bit better this week. The area that probed to bone now has some tissue coverage. There is still some slough buildup on the wound surfaces. 10/10/2022: The wound is strongly malodorous today. There is no purulent drainage, nor any increased erythema. There is some tissue maceration around  the wound, but he did not have a whole lot of drainage on his dressings. There is slough on the wound surface. 10/17/2022: The wound still has a fairly strong odor and the drainage has increased. It is goopy and purulent. The culture that I took last week returned late on Thursday with methicillin and tetracycline resistant staph epidermidis and multiple other staph species at fairly high concentrations. 10/27/2022: The wound has cleaned up considerably. There is no odor and less drainage. The drainage is no longer purulent. He is taking Bactrim as prescribed. 11/03/2022: He completed his course of Bactrim however the wound has deteriorated, has a foul odor, and there are fragments of bone breaking off in  his wound. His drainage was dark and purulent. 11/08/2022: As expected, the bone biopsy returned with osteomyelitis and osteonecrosis. His culture returned with a number of species with the Levaquin I had already prescribed being along with the first-line choice as suggested by the pharmacist. Notably, however, Pseudomonas aeruginosa was not among the species cultivated. He has an appointment to see Dr. Daiva Eves on Wednesday the 19th; he has vascular follow-up on Thursday the sixth. The wound actually looks a little bit better today. There are no chunks of bone falling out, nor am I actually able to probe or palpate bone. He did have a bit more drainage, but this was noted as murky or purulent as at his previous visit. There is slough buildup on the wound surface and the granulation tissue present is a little bit hypertrophic and friable. 11/15/2022: The wound actually looks a little bit better today. I can still palpate bone with his foot in a relaxed, plantarflexed position, but there does seem to be some tissue covering it. The drainage has improved. He has completed the oral levofloxacin that I prescribed. He is going to see vascular surgery on Thursday and has an appointment with infectious disease on Wednesday the 19th. 11/21/2022: There is just a tiny little spike of bone that is still palpable. Everything else looks improved. He is having an angiogram next Friday to see if he can be optimized from an arterial inflow perspective. 11/28/2022: The bone is completely covered with soft tissue. The wound is smaller. 12/05/2022: He had his angiogram last week and 3 vessels in his lower leg was stented. The ankle wound is smaller again today with minimal slough accumulation. He managed to tear his toenail off of his fourth toe today when he was removing his socks. This has left a wound with the nailbed/fat layer exposed. 12/12/2022: The wound was measured the same size with a little bit more depth to it  today, but visual inspection suggest that it is smaller. There is hypertrophic granulation tissue present. The site on his fourth toe is smaller with some eschar and slough accumulation. 12/19/2022: The nailbed is healed. The wound on his ankle is down to just a tiny narrow slit with some slough on the surface. 12/29/2022: His wound is nearly healed. He met with infectious disease today and both his CRP and sed rate were down significantly from prior and they did not feel he needed any additional antibiotic therapy. He has follow-up with vascular surgery on Friday. 01/02/2023: His wound is healed. Electronic Signature(s) Signed: 01/02/2023 3:16:21 PM By: [name] MD FACS Entered By: [name] on 01/02/2023 15:16:21 Lasandra Beech A ([id number]) [id number] Page 4 of 11 -------------------------------------------------------------------------------- Physical Exam Details Patient Name: Date of Service: [name], [name] 01/02/2023 3:00 PM Medical Record Number:  [id number] Patient Account Number: [id number] Date of Birth/Sex: Treating RN: 1955/07/16 (67 y.o. M) Primary Care Provider: [name] Other Clinician: Referring Provider: Treating Provider/Extender: [name]: 26 Constitutional Hypertensive, asymptomatic. . . . no acute distress. Respiratory Normal work of breathing on room air. Notes 01/02/2023: His wound is healed. Electronic Signature(s) Signed: 01/02/2023 3:22:57 PM By: [name] MD FACS Entered By: [name] on 01/02/2023 15:22:56 -------------------------------------------------------------------------------- Physician Orders Details Patient Name: Date of Service: NA [name] 01/02/2023 3:00 PM Medical Record Number: [id number] Patient Account Number: [id number] Date of Birth/Sex: Treating RN: 02-17-1956 (67 y.o. 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 Anesthetic (In clinic) Topical Lidocaine 4% applied to wound bed Edema Control - Lymphedema / SCD / Other Elevate legs to the level of the heart or above for 30 minutes daily and/or when sitting for 3-4 times a day throughout the day. Avoid standing for long periods of time. Non Wound Condition Other Non Wound Condition Orders/Instructions: - make sure the area on your foot is very dry after showering, can pad the area to protect the freshly healed skin Patient Medications llergies: codeine A Notifications Medication Indication Start End 01/02/2023 lidocaine DOSE topical 4 % cream - cream topical PHONG, ISENBERG A ([id number]) [id number] Page 5 of 11 Electronic Signature(s) Signed: 01/02/2023 3:58:48 PM By: [name] MD FACS Entered By: [name] on 01/02/2023 15:23:09 -------------------------------------------------------------------------------- Problem List Details Patient Name: Date of Service: NA [name] 01/02/2023 3:00 PM Medical Record Number: [id number] Patient Account Number: [id number] Date of Birth/Sex: Treating RN: [birth year] (67 y.o. M) Primary Care Provider: [name] Other Clinician: Referring Provider: Treating Provider/Extender: [name]: 26 Active Problems ICD-10 Encounter Code Description Active Date MDM Diagnosis L97.324 Non-pressure chronic  ulcer of left ankle with necrosis of bone 07/04/2022 No Yes L97.522 Non-pressure chronic ulcer of other part of left foot with fat layer exposed 12/05/2022 No Yes I73.9 Peripheral vascular disease, unspecified 07/04/2022 No Yes E11.622 Type 2 diabetes mellitus with other skin ulcer 07/04/2022 No Yes Inactive Problems Resolved Problems Electronic Signature(s) Signed: 01/02/2023 3:15:27 PM By: [name] MD FACS Entered By: [name] on 01/02/2023 15:15:27 -------------------------------------------------------------------------------- Progress Note Details Patient Name: Date of Service: NA [name] 01/02/2023 3:00 PM Medical Record Number: [id number] Patient Account Number: [id number] Date of Birth/Sex: Treating RN: 1956-02-02 (67 y.o. M) Primary Care Provider: [name] Other Clinician: Referring Provider: Treating Provider/Extender: [name] in Treatment: 26 Subjective Chief Complaint Information obtained from Patient Patients presents for treatment of an open diabetic ulcer with exposed bone and osteomyelitis 07/04/2022: Diabetic ankle ulcer ZAKHARI, FOGEL A ([id number]) [id number] Page 6 of 11 History of Present Illness (HPI) ADMISSION 08/25/2021 This is a 67 year old man who initially presented to his primary care provider in September 2022 with pain in his left foot. He was sent for an x-ray and while the x-ray was being performed, the tech pointed out a wound on his foot that the patient  was not aware existed. He does have type 2 diabetes with significant neuropathy. His diabetes is suboptimally controlled with his most recent A1c being 8.5. He also has a history of coronary artery disease status post three- vessel CABG. he was initially seen by orthopedics, but they referred him to [facility]. He has undergone at least 7 operations/debridements and several applications of skin substitute under  the care of podiatry. He has been in a wound VAC for much of this time. His most recent procedure was July 28, 2021. A portion of the talus was biopsied and was found to be consistent with osteomyelitis. Culture also returned positive for corynebacterium. He was seen on August 16, 2021 by infectious disease. A PICC line has been placed and he will be receiving a 6-week course of IV daptomycin and cefepime. In October 2022, he underwent lower extremity vascular studies. Results are copied here: Right: Resting right ankle-brachial index is within normal range. No evidence of significant right lower extremity arterial disease. The right toe-brachial index is abnormal. Left: Resting left ankle-brachial index indicates mild left lower extremity arterial disease. The left toe-brachial index is abnormal. He has not been seen by vascular surgery despite these findings. He presented to clinic today in a cam boot and is using a knee scooter to offload. Wound VAC was in place. Once this was removed, a large ulcer was identified on the left midfoot/ankle. Bone is frankly exposed. There is no malodorous or purulent drainage. There is some granulation tissue over the central portion of the exposed bone. There is a tunnel that extends posteriorly for roughly 10 cm. It has been discussed with him by multiple providers that he is at very high risk of losing his lower leg because of this wound. He is extremely eager to avoid this outcome and is here today to review his options as well as receive ongoing wound care. 09/03/2021: Here for reevaluation of his wound. There does not appear to have been any substantial improvement overall since our last visit. He has been in a wound VAC with white foam overlying the exposed bone. We are working on getting him approved for hyperbaric oxygen therapy. 09/10/2021: We are in the process of getting him cleared to begin hyperbaric oxygen therapy. He still needs to obtain a chest  x-ray. Although the wound measurements are roughly the same, I think the overall appearance of the wound is better. The exposed bone has a bit more granulation tissue covering it. He has not received a vascular surgery appointment to reevaluate his flow to the wound. 09/17/2021: He has been approved for hyperbaric oxygen therapy and completed his chest x-ray, which I reviewed and it appears normal. The tunnels at the 12 and 10:00 positions are smaller. There is more granulation tissue covering the exposed bone and the undermining has decreased. He still has not received a vascular surgery appointment. 09/24/2021: He initiated hyperbaric oxygen therapy this week and is tolerating it well. He has an appointment with vascular surgery coming up on May 16. The granulation tissue is covering more of the exposed bone and both tunnels are a bit smaller. 10/01/2021: He continues to tolerate hyperbaric oxygen therapy. He saw infectious disease and they are planning to pull his PICC line. He has been initiated on oral antibiotics (doxycycline and Augmentin). The wound looks about the same but the tunnels are a little bit smaller. The skin seems to be contracting somewhat around the exposed bone. 10/08/2021: The wound is still  about the same size, but the tunnels continue to come in and the skin is contracting around the exposed bone. He continues to have some accumulation of necrotic material in the inferoposterior aspect of the wound as well as accumulation at the 12:00 tunnel area. 10/15/2021: The wound is smaller today. The tunnels continue to come in. There is less necrotic tissue present. He does have some periwound maceration. 10/22/2021: The wound is about the same size. There is a little bit less undermining at the distal portion. The exposed bone is dark and I am not sure if this is staining from silver nitrate or his VAC sponge or if it represents necrosis. The tunnels are shallower but he does have some  serous drainage coming from the 10:00 tunnel. He continues to tolerate hyperbaric oxygen therapy well. 10/29/2021: The undermining continues to improve. The tunnels are about the same. He has good granulation tissue overlying the majority of the exposed bone. It does appear that perhaps the tubing from his wound VAC has been eroding the skin at the 12 clock position. He continues to accumulate senescent epithelium around the borders of the wound. 11/05/2021: The undermining is almost completely resolved. The tunnels have contracted fairly significantly. No significant slough or debris accumulation. There is still senescent epithelium accumulation around the borders of the wound. He has been tolerating hyperbaric oxygen therapy well. 11/12/2021: Despite the measurements of the wound being about the same, the wound has changed in its shape and overall, I think it is improved. The undermining has resolved and the tunnels continue to shorten. There is good granulation tissue encroaching over the small area of bone that has remained exposed at the 12 o'clock position. Minimal slough accumulation. He continues to tolerate hyperbaric oxygen therapy well. 11/19/2021: I took a PCR culture last week. There was overgrowth of yeast. He is already taking suppressive doxycycline and Augmentin. I added fluconazole to his regimen. The wound is smaller again today. The tunnels continue to shorten. He continues to do well with hyperbaric oxygen therapy. 11/26/2021: For some reason, his foot has become macerated. The wound is narrower but about the same dimensions in its longitudinal aspect. The tunnels continue to shorten. He has some slough buildup on the wound as well as some heaped up senescent epithelium around the perimeter. 12/03/2021: No further maceration of his foot has occurred. The wound has contracted quite significantly from last week. The tunnel at 10:00 is closed. The tunnel at 12:00 is down to just a couple of  millimeters. No other undermining is present. There is soft tissue coverage of the previously exposed bone. There is just a bit of slough and biofilm on the wound surface. 12/10/2021: The wound is looking good. It turns out the tunnel at 12:00 is only exposed when the patient dorsiflexes his foot. It is about 2 cm in depth when he does this; when his foot is in plantarflexion, the tunnel is closed. The bone that was visible at the 12:00 tunnel is completely covered with granulation tissue, but there does feel like some exposed bone deeper into the tunnel area. There is senescent skin heaped up around the periphery. Minimal slough on the wound surface. 12/16/2021: The wound dimensions are roughly the same. The surface has nice granulation tissue. The exposed bone at the 12:00 tunnel continues to be covered with more soft tissue. 7/14; patient's wound measures smaller today. Using the wound VAC with underlying collagen. He is also being treated with HBO for underlying osteomyelitis. He tells  me he is on doxycycline and ampicillin follows with infectious disease next week 12/31/2021: The wound continues to contract. Unfortunately, the area where the track pad and tubing have been rubbing continues to look like it is applying friction. He says that the home health nurses that have been applying the [facility] have been putting gauze underneath the tubing, but nonetheless there is ongoing tissue breakdown at this site. Light slough accumulation on the wound surface. The tunnel continues to contract. He is tolerating HBO without difficulty. EPHRAIM, REICHEL A ([id number]) [id number] Page 7 of 11 01/07/2022: Bridging the wound VAC away from the ankle has resulted in significant improvement in the tissue at the apex of the wound. The tunnel is still present and is not all that much shorter, but the overall wound surface is very robust and healthy looking. Minimal slough accumulation. No concern  for acute infection. 01/21/2022: The wound continues to contract and has a robust granulation tissue surface. The tunnel has come in considerably and is down to about 1.4 cm. There is still bone exposed within the tunnel but the rest of it is well covered. There is some senescent epithelium at the wound margins and a little bit of slough on the surface. 01/28/2022: No significant change in the wound this week, but there has not been any reaccumulation of senescent epithelium or slough. The tunnel is perhaps a millimeter less in depth. He has been approved for Apligraf and we will apply this today. 02/04/2022: The wound has contracted somewhat and the tunnel has filled in completely. The wound surface is clean. He is here for Apligraf #2. 02/11/2022: The wound has contracted further and is now nearly flush with the surrounding skin surface. Light layer of slough. Apligraf #3 plan for today. 02/18/2022: There is a band of epithelium trying to cut across the superior portion of the wound. There is robust granulation tissue with just a light layer of slough and biofilm on the surface. There was a little bit of greenish drainage in the wound VAC but none appreciated on the site itself. He is here for Apligraf #4. 03/04/2022: The wound has contracted considerably. There is good granulation tissue on the surface. Minimal biofilm. He is here for Apligraf #5. 03/18/2022: The wound has epithelialized to the point that it has been divided into 2 areas. Both areas are smaller in total than at his previous visit. There is good granulation tissue present with just a little bit of periwound eschar and surface slough. 10/13; left medial foot. The patient has completed treatment with hyperbaric oxygen for underlying osteomyelitis he has had a nice response in the wound. Noted today that he still had some greenish drainage. Previously he has been treated with topical gentamicin but apparently that was stopped 2 weeks  ago. Fortunately the wound is measuring smaller 10/20; wound looks better no hypergranulation minimal drainage. Granulation tissue looks healthy using gentamicin Hydrofera Blue under compression 04/08/2022: The wound continues to contract tremendously. The response to his treatment for hypertrophic granulation tissue has been quite remarkable. There are just a couple of open areas remaining. 04/18/2022: His wound is healed. READMISSION 07/04/2022: He returns to clinic with a new ulcer adjacent to where his previous large wound had been. He says that he was wearing boots and using a small padded dressing to protect the freshly healed ankle wound. Apparently the dressing rolled up and rubbed on his skin, causing a new wound. He contacted our office last week concerned about the appearance. There is  a small oval wound on his left medial ankle. It is fairly clean with some periwound dry skin and eschar. 07/11/2022: The wound is about the same size this week. There is a layer of rubbery slough on the surface. It has a fairly strong odor. 07/18/2022: The wound measured larger this week due to small satellite areas opening in a fan pattern distal to the primary wound. He continues to accumulate slough on the wound surface. The culture that I took last week only grew out low levels of skin flora. 07/25/2022: The satellite areas that had opened last week have closed. The main wound is smaller with some slough accumulation. 08/01/2022: The wound continues to contract. There is a layer of slough on the surface. 08/08/2022: The wound is a little bit smaller again this week. Still with slough buildup. 3/18; patient presents for follow-up. He has been doing dressing changes on his own with silver alginate and antibiotic ointment. He has been at [employer] over the past 3 weeks. He has developed a new wound. 09/05/2022: I am not sure if the new wound that was reported last week is just the satellite area that had  closed a couple of weeks ago and reopened. Regardless, both wounds are quite small with good epithelialization. There is some eschar buildup around the periphery of both wounds with slough on both wound surfaces. 09/12/2022: The wounds have converged. There is some increased in depth at the distal portion. This seems to be secondary to moisture accumulation as the periwound skin is a bit macerated. 09/26/2022: The wound has deteriorated further. There is increased depth at the distal portion and the wound probes to bone. The patient theorizes that the boot he has been wearing is rubbing in this spot and upon inspection of the boot, there is actually a hard plastic boot in exactly this location, supporting his theory. 10/05/2022: The wound looks a little bit better this week. The area that probed to bone now has some tissue coverage. There is still some slough buildup on the wound surfaces. 10/10/2022: The wound is strongly malodorous today. There is no purulent drainage, nor any increased erythema. There is some tissue maceration around the wound, but he did not have a whole lot of drainage on his dressings. There is slough on the wound surface. 10/17/2022: The wound still has a fairly strong odor and the drainage has increased. It is goopy and purulent. The culture that I took last week returned late on Thursday with methicillin and tetracycline resistant staph epidermidis and multiple other staph species at fairly high concentrations. 10/27/2022: The wound has cleaned up considerably. There is no odor and less drainage. The drainage is no longer purulent. He is taking Bactrim as prescribed. 11/03/2022: He completed his course of Bactrim however the wound has deteriorated, has a foul odor, and there are fragments of bone breaking off in his wound. His drainage was dark and purulent. 11/08/2022: As expected, the bone biopsy returned with osteomyelitis and osteonecrosis. His culture returned with a number of  species with the Levaquin I had already prescribed being along with the first-line choice as suggested by the pharmacist. Notably, however, Pseudomonas aeruginosa was not among the species cultivated. He has an appointment to see Dr. Daiva Eves on Wednesday the 19th; he has vascular follow-up on Thursday the sixth. The wound actually looks a little bit better today. There are no chunks of bone falling out, nor am I actually able to probe or palpate bone. He did have  a bit more drainage, but this was noted as murky or purulent as at his previous visit. There is slough buildup on the wound surface and the granulation tissue present is a little bit hypertrophic and friable. 11/15/2022: The wound actually looks a little bit better today. I can still palpate bone with his foot in a relaxed, plantarflexed position, but there does seem to be some tissue covering it. The drainage has improved. He has completed the oral levofloxacin that I prescribed. He is going to see vascular surgery on Thursday and has an appointment with infectious disease on Wednesday the 19th. 11/21/2022: There is just a tiny little spike of bone that is still palpable. Everything else looks improved. He is having an angiogram next Friday to see if he can be optimized from an arterial inflow perspective. OLUWASEMILORE, BAHL A ([id number]) [id number] Page 8 of 11 11/28/2022: The bone is completely covered with soft tissue. The wound is smaller. 12/05/2022: He had his angiogram last week and 3 vessels in his lower leg was stented. The ankle wound is smaller again today with minimal slough accumulation. He managed to tear his toenail off of his fourth toe today when he was removing his socks. This has left a wound with the nailbed/fat layer exposed. 12/12/2022: The wound was measured the same size with a little bit more depth to it today, but visual inspection suggest that it is smaller. There is hypertrophic granulation  tissue present. The site on his fourth toe is smaller with some eschar and slough accumulation. 12/19/2022: The nailbed is healed. The wound on his ankle is down to just a tiny narrow slit with some slough on the surface. 12/29/2022: His wound is nearly healed. He met with infectious disease today and both his CRP and sed rate were down significantly from prior and they did not feel he needed any additional antibiotic therapy. He has follow-up with vascular surgery on Friday. 01/02/2023: His wound is healed. Patient History Information obtained from Patient. Family History Cancer - Father, Diabetes - Father,Mother,Paternal Grandparents, Heart Disease - Father, Hypertension - Father, No family history of Hereditary Spherocytosis, Kidney Disease, Lung Disease, Seizures, Stroke, Thyroid Problems, Tuberculosis. Social History Never smoker, Marital Status - Married, Alcohol Use - Rarely, Drug Use - No History, Caffeine Use - Daily. Medical History Eyes Patient has history of Cataracts - Removed 2008 Cardiovascular Patient has history of Coronary Artery Disease, Hypertension, Myocardial Infarction, Peripheral Arterial Disease Endocrine Patient has history of Type II Diabetes Musculoskeletal Patient has history of Osteomyelitis Neurologic Patient has history of Neuropathy Medical A Surgical History Notes nd Cardiovascular Hypercholesterolemia Abnormal EKG CABG X3 2019 Gastrointestinal GERD Musculoskeletal Diabetic foot ulcer Objective Constitutional Hypertensive, asymptomatic. no acute distress. Vitals Time Taken: 3:01 PM, Height: 74 in, Weight: 187 lbs, BMI: 24, Temperature: 97.9 F, Pulse: 74 bpm, Respiratory Rate: 18 breaths/min, Blood Pressure: 150/85 mmHg, Capillary Blood Glucose: 137 mg/dl. Respiratory Normal work of breathing on room air. General Notes: 01/02/2023: His wound is healed. Integumentary (Hair, Skin) Wound #2 status is Healed - Epithelialized. Original cause of wound was  Pressure Injury. The date acquired was: 06/13/2022. The wound has been in treatment 26 weeks. The wound is located on the Left,Medial Ankle. The wound measures 0cm length x 0cm width x 0cm depth; 0cm^2 area and 0cm^3 volume. There is no tunneling or undermining noted. There is a none present amount of drainage noted. The wound margin is distinct with the outline attached to the wound base. There is no granulation within  the wound bed. There is no necrotic tissue within the wound bed. The periwound skin appearance had no abnormalities noted for texture. The periwound skin appearance had no abnormalities noted for moisture. The periwound skin appearance had no abnormalities noted for color. Periwound temperature was noted as No Abnormality. 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 Anesthetic: (In clinic) Topical Lidocaine 4% applied to wound bed Edema Control - Lymphedema / SCD / Other: Elevate legs to the level of the heart or above for 30 minutes daily and/or when sitting for 3-4 times a day throughout the day. Avoid standing for long periods of time. Non Wound Condition: Other Non Wound Condition Orders/Instructions: - make sure the area on your foot is very dry after showering, can pad the area to protect the freshly healed skin The following medication(s) was prescribed: lidocaine topical 4 % cream cream topical was prescribed at facility 01/02/2023: His wound is healed. The way his skin has scarred, there is a small crevice; I cautioned him to make sure that that area is kept dry to  avoid moisture-related tissue breakdown. He is having a new boot made without buckles. We will discharge him from the wound care center at this time. He may follow up as needed. Electronic Signature(s) Signed: 01/02/2023 3:24:41 PM By: [name] MD FACS Entered By: [name] on 01/02/2023 15:24:40 -------------------------------------------------------------------------------- HxROS Details Patient Name: Date of Service: NA [name], [name] 01/02/2023 3:00 PM Medical Record Number: [id number] Patient Account Number: [id number] Date of Birth/Sex: Treating RN: [birth year] (67 y.o. M) Primary Care Provider: [name] Other Clinician: Referring Provider: Treating Provider/Extender: [name]: 26 Information Obtained From Patient Eyes Medical History: Positive for: Cataracts - Removed 2008 Cardiovascular Medical History: Positive for: Coronary Artery Disease; Hypertension; Myocardial Infarction; Peripheral Arterial Disease Past Medical History Notes: Hypercholesterolemia Abnormal EKG CABG X3 2019 Gastrointestinal Medical History: Past Medical History Notes: GERD Endocrine Medical History: Positive for: Type II Diabetes Time with diabetes: 24 years Treated with: Insulin, Oral agents ROYLEE, CHAFFIN A ([id number]) [id number] Page 10 of 11 Blood sugar tested every day: Yes Tested : Musculoskeletal Medical History: Positive for: Osteomyelitis Past Medical History Notes: Diabetic foot ulcer Neurologic Medical History: Positive for: Neuropathy HBO Extended History Items Eyes: Cataracts Immunizations Pneumococcal Vaccine: Received Pneumococcal Vaccination: Yes Received Pneumococcal Vaccination On or After 60th Birthday: Yes Implantable Devices Yes Family and Social History Cancer: Yes - Father; Diabetes: Yes - Father,Mother,Paternal Grandparents; Heart Disease: Yes - Father; Hereditary  Spherocytosis: No; Hypertension: Yes - Father; Kidney Disease: No; Lung Disease: No; Seizures: No; Stroke: No; Thyroid Problems: No; Tuberculosis: No; Never smoker; Marital Status - Married; Alcohol Use: Rarely; Drug Use: No History; Caffeine Use: Daily; Financial Concerns: No; Food, Clothing or Shelter Needs: No; Support System Lacking: No; Transportation Concerns: No Electronic Signature(s) Signed: 01/02/2023 3:58:48 PM By: [name] MD FACS Entered By: [name] on 01/02/2023 15:21:56 -------------------------------------------------------------------------------- SuperBill Details Patient Name: Date of Service: NA [name] 01/02/2023 Medical Record Number: [id number] Patient Account Number: [id number] Date of Birth/Sex: Treating RN: 1955/07/08 (67 y.o. 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 L97.522 Non-pressure chronic ulcer of other part of left foot with fat layer exposed I73.9 Peripheral vascular disease, unspecified E11.622 Type 2 diabetes mellitus with other skin ulcer Facility Procedures : CPT4 Code: [id number] Description: 99213 - WOUND CARE VISIT-LEV 3 EST PT Modifier: Quantity: 1 Physician Procedures : CPT4 Code Description Modifier [id number] 99213 - WC PHYS LEVEL 3 - EST PT ICD-10 Diagnosis Description CREEDENCE, KUNESH A ([id number]) [id number] L97.324 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

## 2023-01-03 ENCOUNTER — Encounter: Payer: BC Managed Care – PPO | Admitting: Vascular Surgery

## 2023-01-09 ENCOUNTER — Ambulatory Visit (HOSPITAL_BASED_OUTPATIENT_CLINIC_OR_DEPARTMENT_OTHER): Payer: BC Managed Care – PPO | Admitting: General Surgery

## 2023-02-06 NOTE — Progress Notes (Deleted)
[facility] [place]  ASSESSMENT / PLAN: [name] is a 67 y.o. male with atherosclerosis of native arteries of left lower extremity causing ulceration. Status post left popliteal, tibioperoneal trunk, and posterior tibial angioplasty 12/02/22.   Recommend:  Abstinence from all tobacco products. Blood glucose control with goal A1c < 7%. Blood pressure control with goal blood pressure < 140/90 mmHg. Lipid reduction therapy with goal LDL-C <100 mg/dL (***<88 if symptomatic from PAD).  Aspirin 81mg  PO QD.  Clopidogrel 75mg  PO QD. Atorvastatin 40-80mg  PO QD (or other "high intensity" statin therapy).  CHIEF COMPLAINT: ***  HISTORY OF PRESENT ILLNESS: [name] is a 67 y.o. male is a pleasant 67 y.o. male who I last saw on 01/13/2022 with osteomyelitis of the left foot.  He had an extensive wound on his left foot but was making excellent progress at the wound care center.  He had a VAC on the wound when I saw him.  He was sent for evaluation for peripheral arterial disease.  Based on his noninvasive study I felt that he likely had adequate circulation to heal the wound.  His toe pressure was 94 mmHg.  Typically a toe pressure of 50-60 is adequate for healing.  He had a palpable femoral and popliteal pulse on the left.  I felt that he may have some underlying tibial disease.  I felt that if the wounds stopped making progress we should consider arteriography.   Since I saw him last the wound healed.  However, he subsequently developed a wound in the more distal part of the foot in March.  This has been slow to heal.  He was sent for reevaluation.   He denies any claudication or rest pain.  His activity is limited by the wound on his foot.   He has undergone previous coronary revascularization.   02/07/23: ***  Past Medical History:  Diagnosis Date   Acute osteomyelitis of calcaneum (HCC) 11/29/2022   Coronary artery disease    Diabetes mellitus without  complication (HCC)    Type II   GERD (gastroesophageal reflux disease)    Hypertension    Myocardial infarction [facility])    "Silent"   Neuropathy     Past Surgical History:  Procedure Laterality Date   ABDOMINAL AORTOGRAM W/LOWER EXTREMITY N/A 12/02/2022   Procedure: ABDOMINAL AORTOGRAM W/LOWER EXTREMITY;  Surgeon: [name], MD;  Location: [facility];  Service: Cardiovascular;  Laterality: N/A;   APPLICATION OF WOUND VAC Left 07/28/2021   Procedure: APPLICATION OF WOUND VAC;  Surgeon: Park Liter, DPM;  Location: [facility];  Service: Podiatry;  Laterality: Left;   APPLICATION OF WOUND VAC Left 03/19/2021   Procedure: APPLICATION OF WOUND VAC;  Surgeon: Park Liter, DPM;  Location: [facility];  Service: Podiatry;  Laterality: Left;   BONE BIOPSY Left 05/19/2021   Procedure: BONE BIOPSY;  Surgeon: Park Liter, DPM;  Location: [facility];  Service: Podiatry;  Laterality: Left;   CATARACT EXTRACTION Bilateral    COLONOSCOPY W/ POLYPECTOMY     CORONARY ARTERY BYPASS GRAFT N/A 12/26/2017   Procedure: CORONARY ARTERY BYPASS GRAFTING (CABG) x 3 Using Left Internal Mammary Artery (LIMA) and Edoscopically Harvested Left Leg Greater Saphenous Vein Graft (SVG); -LIMA to LAD, -SVG to LEFT CIRCUMFLEX, -SVG to PDA,;  Surgeon: Kerin Perna, MD;  Location: [facility] OR;  Service: Open Heart Surgery;  Laterality: N/A;   CORONARY PRESSURE/FFR STUDY N/A 12/19/2017   Procedure: INTRAVASCULAR PRESSURE WIRE/FFR  STUDY;  Surgeon: Lyn Records, MD;  Location: [facility] INVASIVE CV LAB;  Service: Cardiovascular;  Laterality: N/A;   ENDOVEIN HARVEST OF GREATER SAPHENOUS VEIN Left 12/26/2017   Procedure: ENDOVEIN HARVEST OF GREATER SAPHENOUS VEIN: Left Thigh to below the knee;  Surgeon: Kerin Perna, MD;  Location: [facility] OR;  Service: Open Heart Surgery;  Laterality: Left;   LEFT HEART CATH AND CORONARY ANGIOGRAPHY N/A 12/19/2017   Procedure: LEFT HEART CATH AND CORONARY ANGIOGRAPHY;  Surgeon: Lyn Records, MD;   Location: [facility];  Service: Cardiovascular;  Laterality: N/A;   PERIPHERAL VASCULAR INTERVENTION  12/02/2022   Procedure: PERIPHERAL VASCULAR INTERVENTION;  Surgeon: [name], MD;  Location: [facility];  Service: Cardiovascular;;   TEE WITHOUT CARDIOVERSION N/A 12/26/2017   Procedure: TRANSESOPHAGEAL ECHOCARDIOGRAM (TEE);  Surgeon: Donata Clay, Theron Arista, MD;  Location: [facility] OR;  Service: Open Heart Surgery;  Laterality: N/A;   VASECTOMY     WOUND DEBRIDEMENT Left 03/16/2021   Procedure: DEBRIDEMENT LEFT FOOT WOUND;  Surgeon: Park Liter, DPM;  Location: [facility];  Service: Podiatry;  Laterality: Left;   WOUND DEBRIDEMENT Left 04/13/2021   Procedure: DEBRIDEMENT WOUND;  Surgeon: Park Liter, DPM;  Location: [facility];  Service: Podiatry;  Laterality: Left;  Leave patient on stretcher   WOUND DEBRIDEMENT Left 05/19/2021   Procedure: DEBRIDEMENT WOUND;  Surgeon: Park Liter, DPM;  Location: [facility];  Service: Podiatry;  Laterality: Left;   WOUND DEBRIDEMENT Left 07/06/2021   Procedure: DEBRIDEMENT WOUND;  Surgeon: Park Liter, DPM;  Location: [facility];  Service: Podiatry;  Laterality: Left;   WOUND DEBRIDEMENT Left 07/28/2021   Procedure: DEBRIDEMENT WOUND AND APPLICATION OF SKIN GRAFT SUBSTITUTE;  Surgeon: Park Liter, DPM;  Location: [facility];  Service: Podiatry;  Laterality: Left;   WOUND DEBRIDEMENT Left 03/19/2021   Procedure: DEBRIDEMENT WOUND;  Surgeon: Park Liter, DPM;  Location: [facility];  Service: Podiatry;  Laterality: Left;    Family History  Problem Relation Age of Onset   Diabetes Mother    Diabetes Father    Heart disease Father    Diabetes Daughter     Social History   Socioeconomic History   Marital status: Married    Spouse name: Not on file   Number of children: Not on file   Years of education: Not on file   Highest education level: Not on file  Occupational History   Not on file  Tobacco Use   Smoking status: Never   Smokeless  tobacco: Never  Vaping Use   Vaping status: Never Used  Substance and Sexual Activity   Alcohol use: Never   Drug use: Never   Sexual activity: Not on file  Other Topics Concern   Not on file  Social History Narrative   Not on file   Social Determinants of Health   Financial Resource Strain: Not on file  Food Insecurity: Not on file  Transportation Needs: Not on file  Physical Activity: Not on file  Stress: Not on file  Social Connections: Not on file  Intimate Partner Violence: Not on file    Allergies  Allergen Reactions   Codeine Sulfate [Codeine] Nausea Only    Current Outpatient Medications  Medication Sig Dispense Refill   acetaminophen (TYLENOL) 500 MG tablet Take 2 tablets (1,000 mg total) by mouth every 6 (six) hours as needed for mild pain or fever. 30 tablet 0   aspirin EC 81 MG tablet Take  81 mg by mouth daily.     atorvastatin (LIPITOR) 80 MG tablet Take 80 mg by mouth daily.     calcium carbonate (TUMS - DOSED IN MG ELEMENTAL CALCIUM) 500 MG chewable tablet Chew 2 tablets by mouth daily as needed for indigestion or heartburn.     clopidogrel (PLAVIX) 75 MG tablet Take 1 tablet (75 mg total) by mouth daily. 30 tablet 11   ibuprofen (ADVIL) 200 MG tablet Take 400 mg by mouth every 8 (eight) hours as needed (pain.).     LANTUS 100 UNIT/ML injection Inject 30 Units into the skin 2 (two) times daily.     lisinopril (ZESTRIL) 10 MG tablet Take 10 mg by mouth in the morning.     metFORMIN (GLUCOPHAGE) 850 MG tablet Take 850 mg by mouth 3 (three) times daily.     metoprolol succinate (TOPROL-XL) 50 MG 24 hr tablet Take 50 mg by mouth daily. Take with or immediately following a meal.     sitaGLIPtin (JANUVIA) 100 MG tablet Take 100 mg by mouth every morning.      No current facility-administered medications for this visit.    PHYSICAL EXAM There were no vitals filed for this visit.  Constitutional: *** appearing. *** distress. Appears *** nourished.  Neurologic:  CN ***. *** focal findings. *** sensory loss. Psychiatric: *** Mood and affect symmetric and appropriate. Eyes: *** No icterus. No conjunctival pallor. Ears, nose, throat: *** mucous membranes moist. Midline trachea.  Cardiac: *** rate and rhythm.  Respiratory: *** unlabored. Abdominal: *** soft, non-tender, non-distended.  Peripheral vascular: *** Extremity: *** edema. *** cyanosis. *** pallor.  Skin: *** gangrene. *** ulceration.  Lymphatic: *** Stemmer's sign. *** palpable lymphadenopathy.    PERTINENT LABORATORY AND RADIOLOGIC DATA  Most recent CBC    Latest Ref Rng & Units 12/02/2022    5:37 AM 11/30/2022    9:59 AM 12/28/2021   11:27 AM  CBC  WBC 3.8 - 10.8 Thousand/uL  5.4  7.2   Hemoglobin 13.0 - 17.0 g/dL 16.1  09.6  04.5   Hematocrit 39.0 - 52.0 % 41.0  40.7  38.6   Platelets 140 - 400 Thousand/uL  178  240      Most recent CMP    Latest Ref Rng & Units 12/02/2022    5:37 AM 11/30/2022    9:59 AM 12/28/2021   11:27 AM  CMP  Glucose 70 - 99 mg/dL 409  811  914   BUN 8 - 23 mg/dL 25  19  19    Creatinine 0.61 - 1.24 mg/dL 7.82  9.56  2.13   Sodium 135 - 145 mmol/L 141  138  137   Potassium 3.5 - 5.1 mmol/L 3.9  5.4  5.0   Chloride 98 - 111 mmol/L 106  106  103   CO2 20 - 32 mmol/L  28  30   Calcium 8.6 - 10.3 mg/dL  9.3  9.1   Total Protein 6.1 - 8.1 g/dL  6.3    Total Bilirubin 0.2 - 1.2 mg/dL  0.5    AST 10 - 35 U/L  16    ALT 9 - 46 U/L  20     Hgb A1c MFr Bld (%)  Date Value  07/02/2021 8.5 (H)    Vascular Imaging: ***  Dilana Mcphie N. Lenell Antu, MD FACS [facility] Phone Number: [phone] 02/06/2023 10:14 AM   Total time spent on preparing this encounter including chart review, data  review, collecting history, examining the patient, coordinating care for this {[id number]}  Portions of this report may have been transcribed using voice recognition software.  Every effort has been made to ensure accuracy;  however, inadvertent computerized transcription errors may still be present.

## 2023-02-07 ENCOUNTER — Ambulatory Visit (HOSPITAL_COMMUNITY): Payer: BC Managed Care – PPO | Attending: Vascular Surgery

## 2023-02-07 ENCOUNTER — Ambulatory Visit (HOSPITAL_COMMUNITY): Payer: BC Managed Care – PPO

## 2023-02-07 ENCOUNTER — Encounter: Payer: BC Managed Care – PPO | Admitting: Vascular Surgery

## 2023-02-08 ENCOUNTER — Telehealth: Payer: Self-pay | Admitting: Vascular Surgery

## 2023-02-17 ENCOUNTER — Ambulatory Visit (HOSPITAL_COMMUNITY): Payer: BC Managed Care – PPO

## 2023-02-21 ENCOUNTER — Encounter: Payer: BC Managed Care – PPO | Admitting: Vascular Surgery

## 2023-04-10 NOTE — Progress Notes (Unsigned)
REASON FOR VISIT:   Status post endovascular intervention for left foot chronic limb threatening ischemia  MEDICAL ISSUES:   [name] is a 67 y.o. male status post left popliteal angioplasty, tibioperoneal trunk angioplasty, posterior tibial angioplasty for chronic limb threatening ischemia on 12/02/2022  Recommend:  Abstinence from all tobacco products. Blood glucose control with goal A1c < 7%. Blood pressure control with goal blood pressure < 140/90 mmHg. Lipid reduction therapy with goal LDL-C <100 mg/dL  Aspirin 81mg  PO QD.  Clopidogrel 75mg  PO QD. Atorvastatin 40-80mg  PO QD (or other "high intensity" statin therapy).  Patient's left foot has healed.  He is doing well overall.  Follow-up with me in 12 months with repeat ABI.  HPI:   [name] is a pleasant 67 y.o. male who I last saw on 01/13/2022 with osteomyelitis of the left foot.  He had an extensive wound on his left foot but was making excellent progress at the wound care center.  He had a VAC on the wound when I saw him.  He was sent for evaluation for peripheral arterial disease.  Based on his noninvasive study I felt that he likely had adequate circulation to heal the wound.  His toe pressure was 94 mmHg.  Typically a toe pressure of 50-60 is adequate for healing.  He had a palpable femoral and popliteal pulse on the left.  I felt that he may have some underlying tibial disease.  I felt that if the wounds stopped making progress we should consider arteriography.  Since I saw him last the wound healed.  However, he subsequently developed a wound in the more distal part of the foot in March.  This has been slow to heal.  He was sent for reevaluation.  He denies any claudication or rest pain.  His activity is limited by the wound on his foot.  He has undergone previous coronary revascularization.   04/11/23: Returns to clinic for surveillance.  He underwent angioplasty of popliteal, tibioperoneal trunk, and  posterior tibial arteries with me in June 2024.  He was lost to follow-up.  He returns today.  He is healed his left foot nicely.  He has no complaints.  We reviewed strategies for reducing risk of atherosclerotic adverse events.  Past Medical History:  Diagnosis Date   Acute osteomyelitis of calcaneum (HCC) 11/29/2022   Coronary artery disease    Diabetes mellitus without complication (HCC)    Type II   GERD (gastroesophageal reflux disease)    Hypertension    Myocardial infarction [facility])    "Silent"   Neuropathy     Family History  Problem Relation Age of Onset   Diabetes Mother    Diabetes Father    Heart disease Father    Diabetes Daughter     SOCIAL HISTORY: Social History   Tobacco Use   Smoking status: Never   Smokeless tobacco: Never  Substance Use Topics   Alcohol use: Never    Allergies  Allergen Reactions   Codeine Sulfate [Codeine] Nausea Only    Current Outpatient Medications  Medication Sig Dispense Refill   acetaminophen (TYLENOL) 500 MG tablet Take 2 tablets (1,000 mg total) by mouth every 6 (six) hours as needed for mild pain or fever. 30 tablet 0   aspirin EC 81 MG tablet Take 81 mg by mouth daily.     atorvastatin (LIPITOR) 80 MG tablet Take 80 mg by mouth daily.     calcium carbonate (TUMS - DOSED  IN MG ELEMENTAL CALCIUM) 500 MG chewable tablet Chew 2 tablets by mouth daily as needed for indigestion or heartburn.     clopidogrel (PLAVIX) 75 MG tablet Take 1 tablet (75 mg total) by mouth daily. 30 tablet 11   ibuprofen (ADVIL) 200 MG tablet Take 400 mg by mouth every 8 (eight) hours as needed (pain.).     LANTUS 100 UNIT/ML injection Inject 30 Units into the skin 2 (two) times daily.     lisinopril (ZESTRIL) 10 MG tablet Take 10 mg by mouth in the morning.     metFORMIN (GLUCOPHAGE) 850 MG tablet Take 850 mg by mouth 3 (three) times daily.     metoprolol succinate (TOPROL-XL) 50 MG 24 hr tablet Take 50 mg by mouth daily. Take with or immediately  following a meal.     sitaGLIPtin (JANUVIA) 100 MG tablet Take 100 mg by mouth every morning.      No current facility-administered medications for this visit.   PHYSICAL EXAM:   Vitals:   04/11/23 1505  BP: 121/76  Pulse: 70  Resp: 20  Temp: 98.1 F (36.7 C)  SpO2: 100%  Weight: 190 lb (86.2 kg)  Height: 6\' 2"  (1.88 m)   Left foot warm and well perfused No residual ulcer   DATA:     +-------+-----------+-----------+------------+------------+  ABI/TBIToday's ABIToday's TBIPrevious ABIPrevious TBI  +-------+-----------+-----------+------------+------------+  Right 1.06       0.77       1.05        0.83          +-------+-----------+-----------+------------+------------+  Left  0.95       1.06       1.19        0.77          +-------+-----------+-----------+------------+------------+   Summary:  Left: Patent lower extremity without evidence of stenosis.    [name] [facility] [phone]

## 2023-04-11 ENCOUNTER — Encounter: Payer: Self-pay | Admitting: Vascular Surgery

## 2023-04-11 ENCOUNTER — Ambulatory Visit (INDEPENDENT_AMBULATORY_CARE_PROVIDER_SITE_OTHER): Payer: BC Managed Care – PPO | Admitting: Vascular Surgery

## 2023-04-11 ENCOUNTER — Ambulatory Visit (INDEPENDENT_AMBULATORY_CARE_PROVIDER_SITE_OTHER)
Admission: RE | Admit: 2023-04-11 | Discharge: 2023-04-11 | Disposition: A | Discharge status: Home / Self Care | Payer: BC Managed Care – PPO | Source: Ambulatory Visit | Attending: Vascular Surgery | Admitting: Vascular Surgery

## 2023-04-11 ENCOUNTER — Ambulatory Visit (HOSPITAL_COMMUNITY)
Admission: RE | Admit: 2023-04-11 | Discharge: 2023-04-11 | Disposition: A | Discharge status: Home / Self Care | Payer: BC Managed Care – PPO | Source: Ambulatory Visit | Attending: Vascular Surgery | Admitting: Vascular Surgery

## 2023-04-11 VITALS — BP 121/76 | HR 70 | Temp 98.1°F | Resp 20 | Ht 74.0 in | Wt 190.0 lb

## 2023-04-11 DIAGNOSIS — I739 Peripheral vascular disease, unspecified: Secondary | ICD-10-CM | POA: Insufficient documentation

## 2023-04-11 LAB — VAS US ABI WITH/WO TBI
Left ABI: 0.95
Right ABI: 1.06

## 2023-04-27 ENCOUNTER — Other Ambulatory Visit: Payer: Self-pay

## 2023-04-27 DIAGNOSIS — I739 Peripheral vascular disease, unspecified: Secondary | ICD-10-CM

## 2023-07-23 IMAGING — MR MR FOOT*L* W/O CM
4 of 5 series · 16 of 40 positions shown · non-contrast
Comparison: Left foot x-rays from same day. Left ankle x-rays dated
March 01, 2021.

CLINICAL DATA: Medial foot ulcer.

EXAM:
MRI OF THE LEFT FOOT WITHOUT CONTRAST
TECHNIQUE: Multiplanar, multisequence MR imaging of the left ankle was
performed. No intravenous contrast was administered.

[Series 4: T1 · axial · left · 3.0mm · 0.27mm/px · z∈[+33,+136]mm · 3 of 43 slices shown (1 of 2)]
[im 7/43]
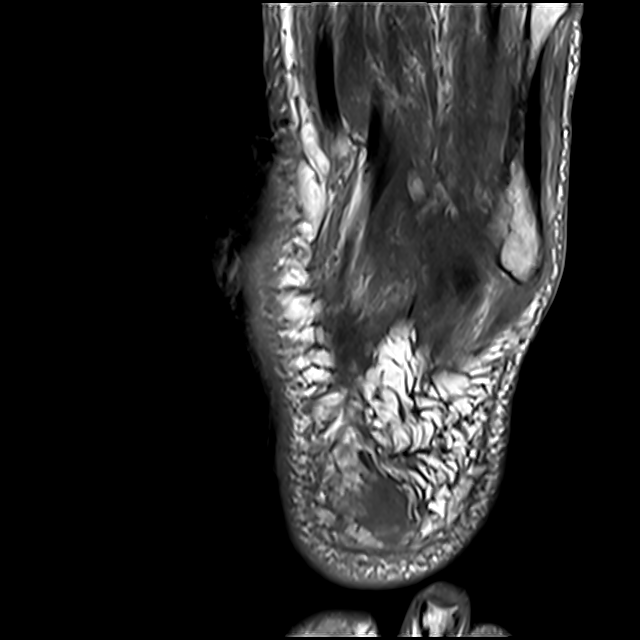
[im 25/43]
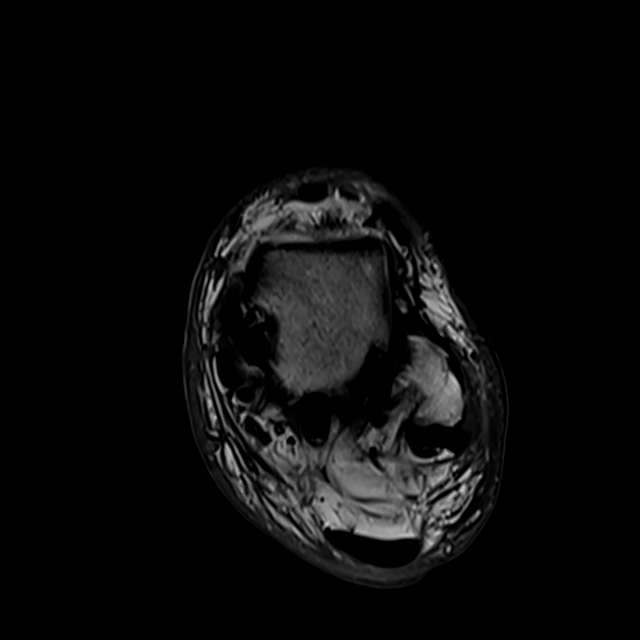
[im 37/43]
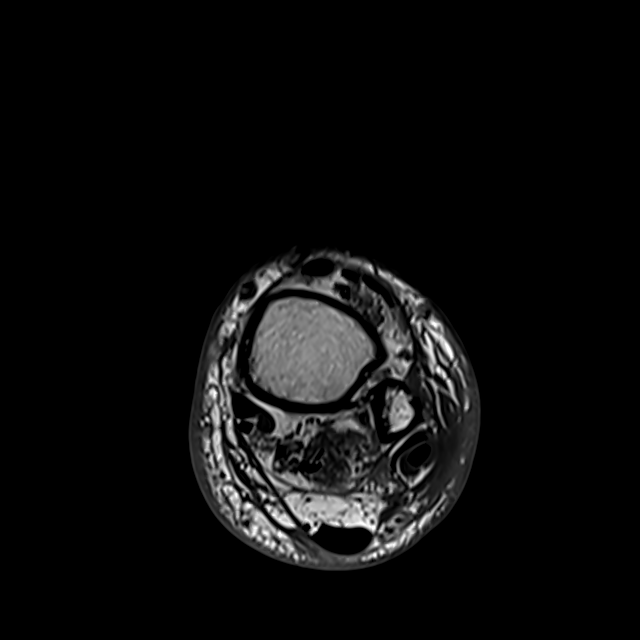

[Series 5: T2 fat-sat · axial · left · 3.0mm · 0.27mm/px · z∈[+1,+127]mm · 7 of 43 slices shown (1 of 2)]
[im 1/43]
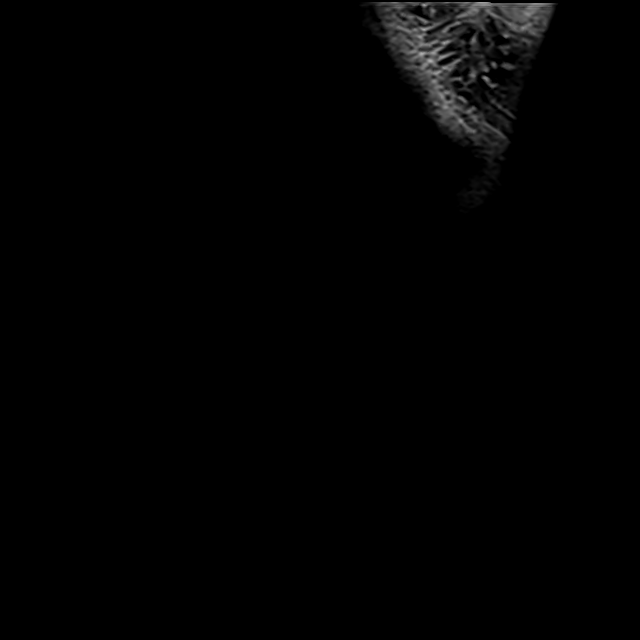
[im 7/43]
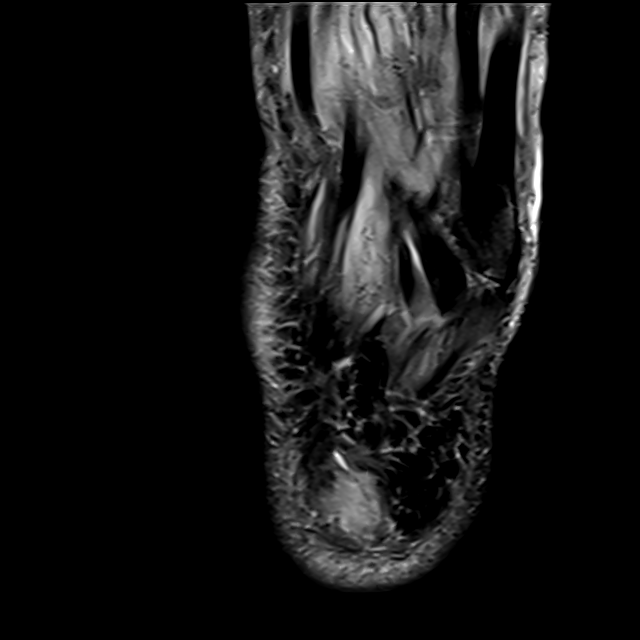
[im 13/43]
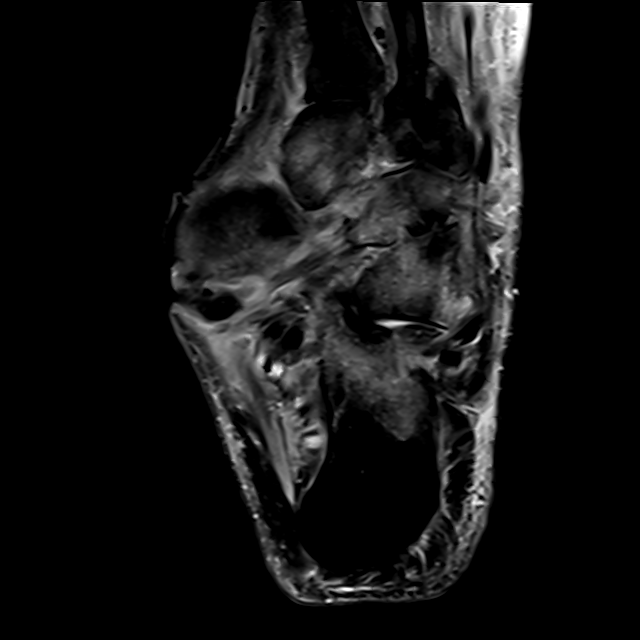
[im 19/43]
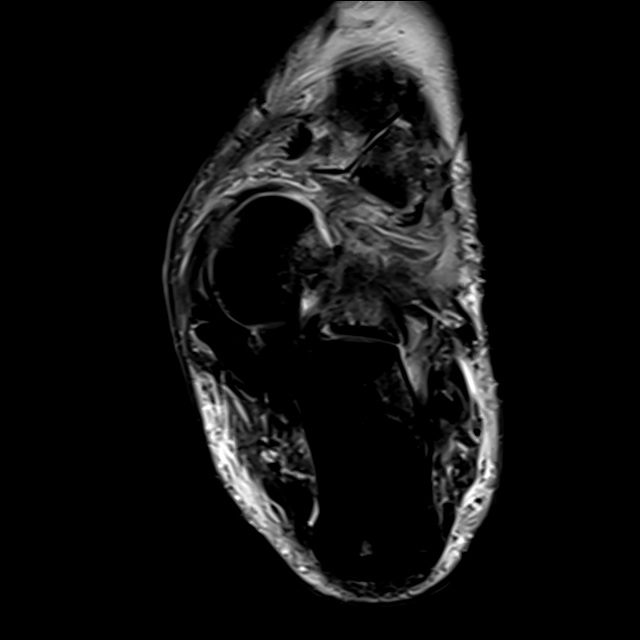
[im 25/43]
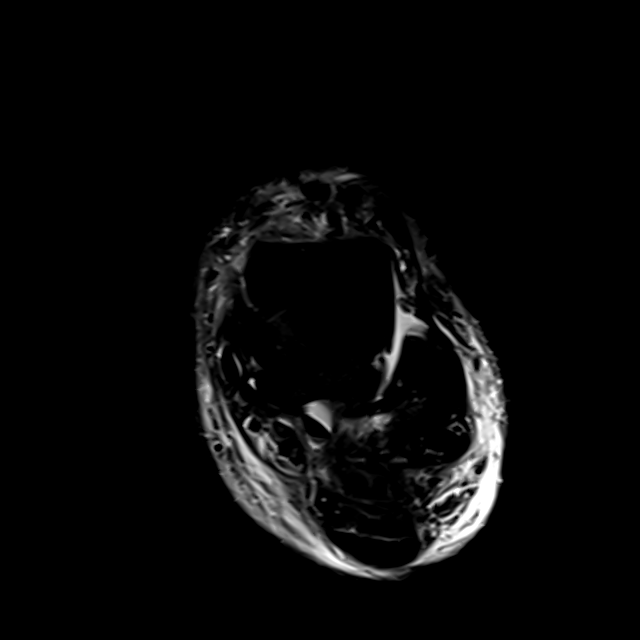
[im 31/43]
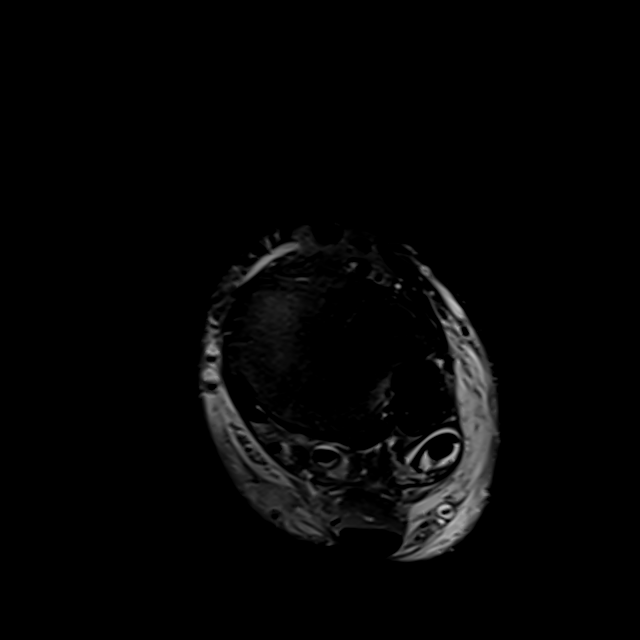
[im 37/43]
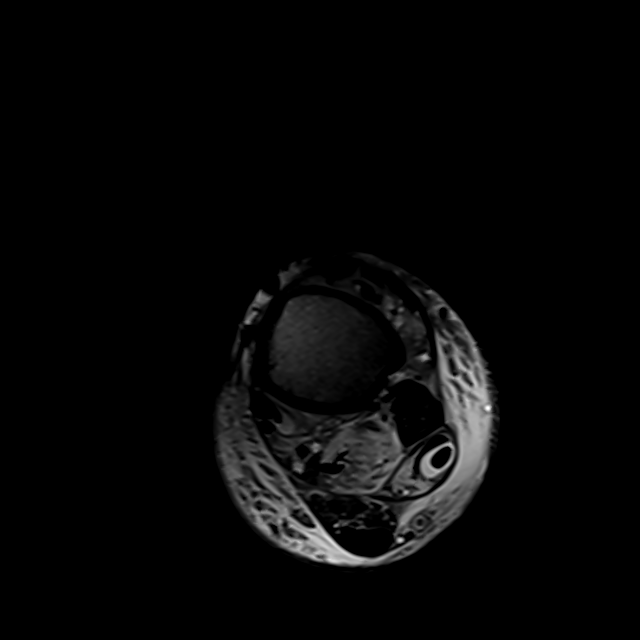

[Series 6: T2 fat-sat · coronal · left · 3.0mm · 0.35mm/px · 3 of 47 slices shown (2 of 2)]
[im 6/47]
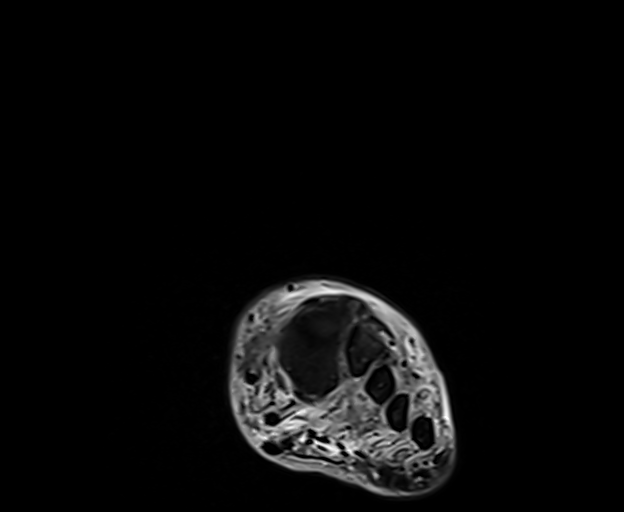
[im 24/47]
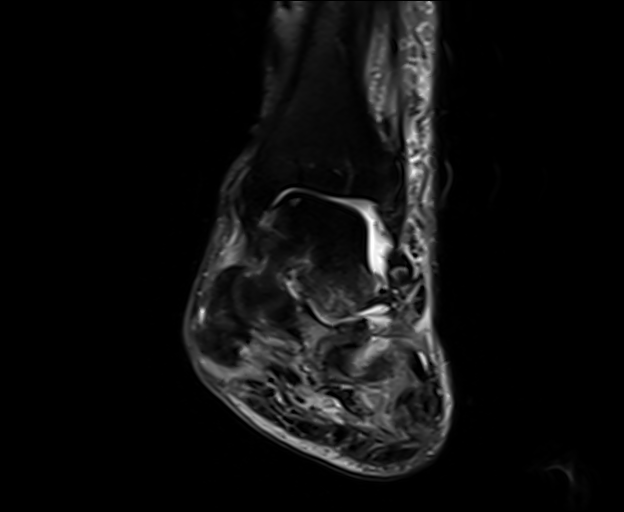
[im 41/47]
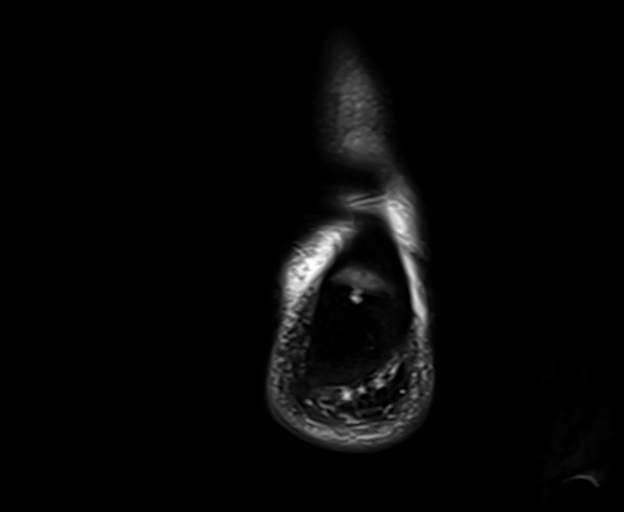

[Series 7: T1 · coronal · left · 3.0mm · 0.70mm/px · 3 of 47 slices shown (2 of 2)]
[im 6/47]
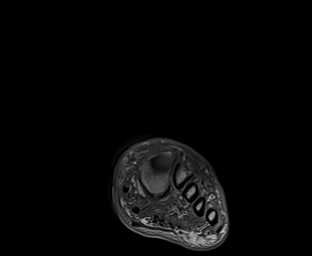
[im 24/47]
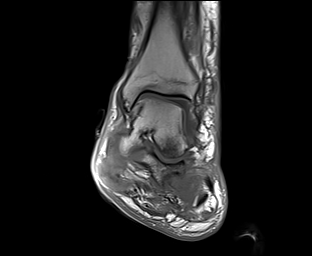
[im 41/47]
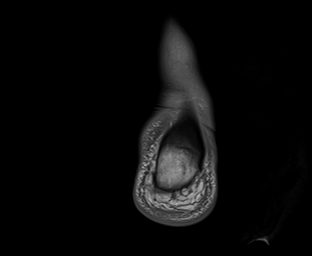

[16 of 40 positions shown; findings below may reference images not displayed]

FINDINGS: Bones/Joint/Cartilage

Medial dislocation of the navicular with respect to the kidney a
forms, as well as the calcaneus with respect to the cuboid. Mild
impaction fracture of the anterolateral calcaneus (series 8, image
18). Patchy marrow edema involving the calcaneus, cuboid,
cuneiforms, and navicular, likely related to neuropathic
arthropathy. No joint effusion.

Ligaments

Lisfranc ligament is intact.

Muscles and Tendons
Flexor and extensor tendons are intact. Split tear of the peroneal
brevis tendon (series 5, image 17). Intact Achilles tendon with mild
tendinosis.

Soft tissue
Soft tissue ulceration of the medial midfoot overlying the
dislocated navicular. Diffuse soft tissue swelling. No fluid
collection or hematoma. No soft tissue mass.
IMPRESSION: 1. Neuropathic arthropathy with dislocated naviculocuneiform and
calcaneocuboid joints.
2. Soft tissue ulceration of the medial midfoot overlying the
dislocated navicular, extending to bone. Probable early
osteomyelitis of the medial navicular given exposed bone. No
abscess.
3. Impaction fracture of the anterolateral calcaneus.
4. Split tear of the peroneal brevis tendon.

## 2023-07-23 IMAGING — DX DG FOOT COMPLETE 3+V*L*
2 series · 3 of 3 positions shown · non-contrast
Comparison: Ankle film from 03/01/2021

CLINICAL DATA: Left foot wound medially with concern for possible
osteo myelitis.

EXAM:
LEFT FOOT - COMPLETE 3+ VIEW

[Series 1: foot · 0.14mm/px · 2 of 2 slices shown]
[im 1/2]
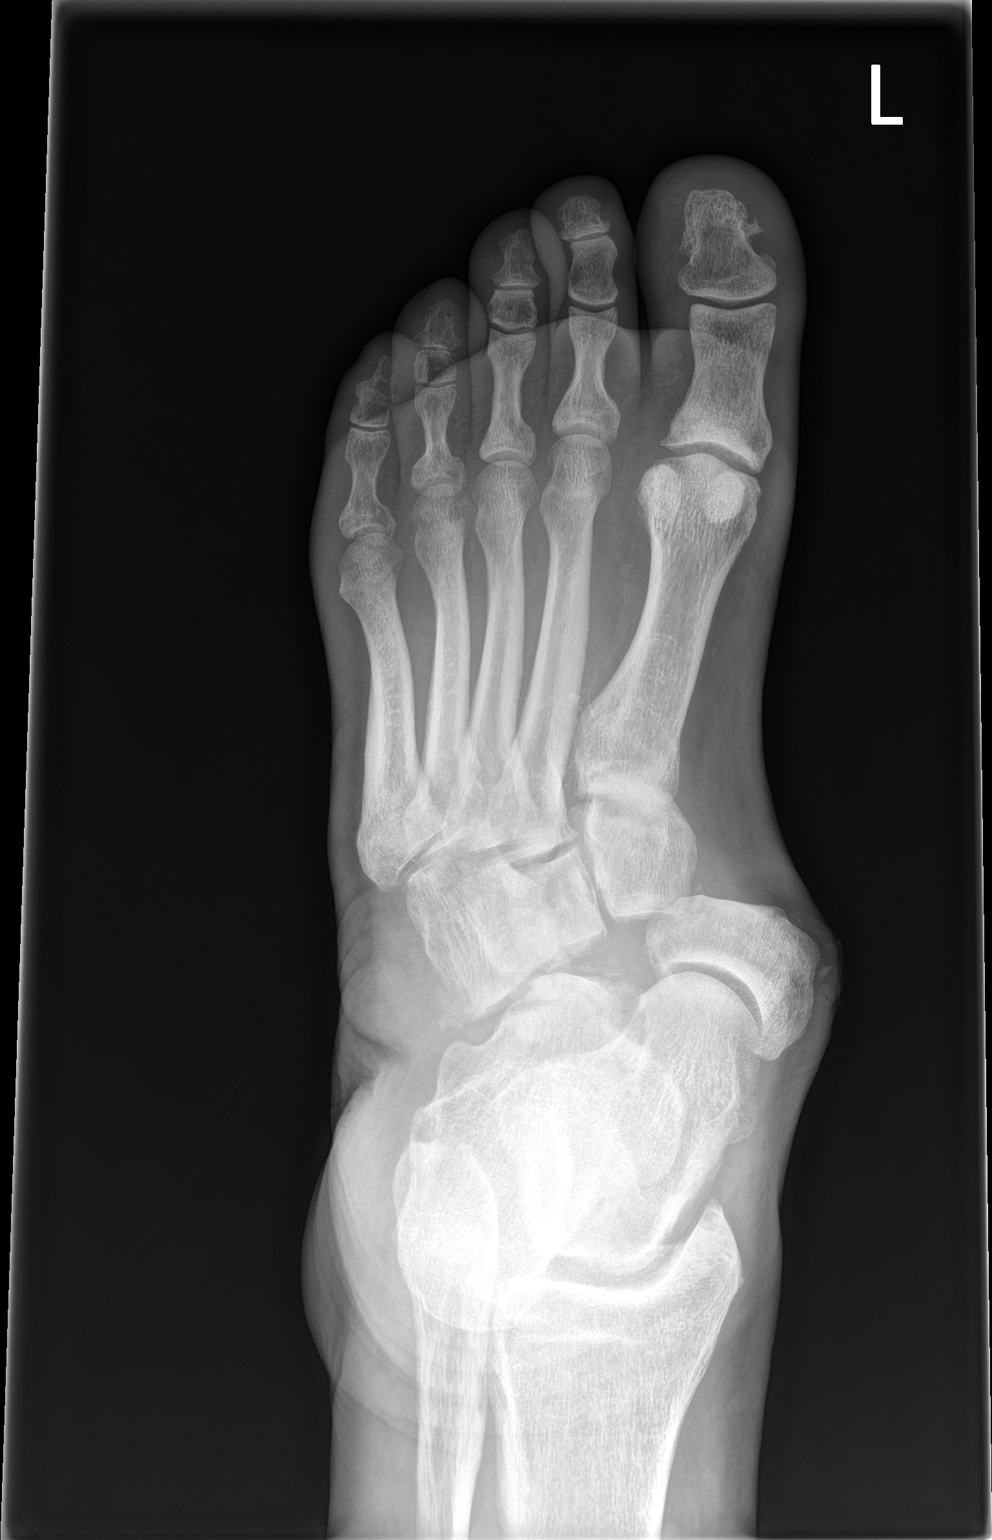
[im 2/2]
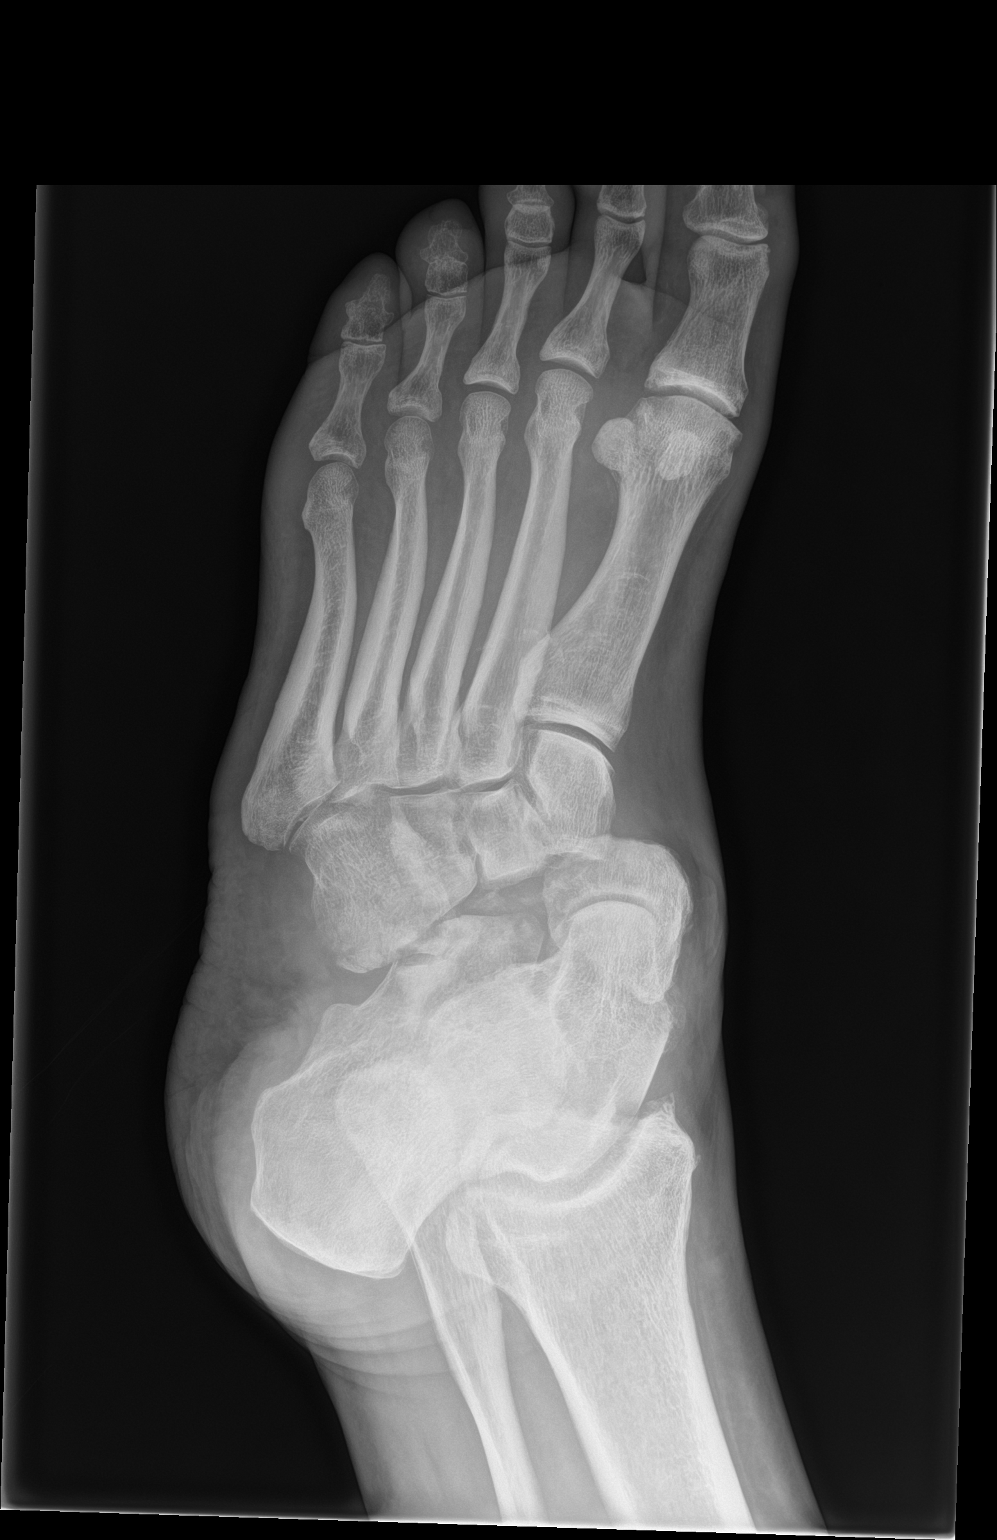

[leg]
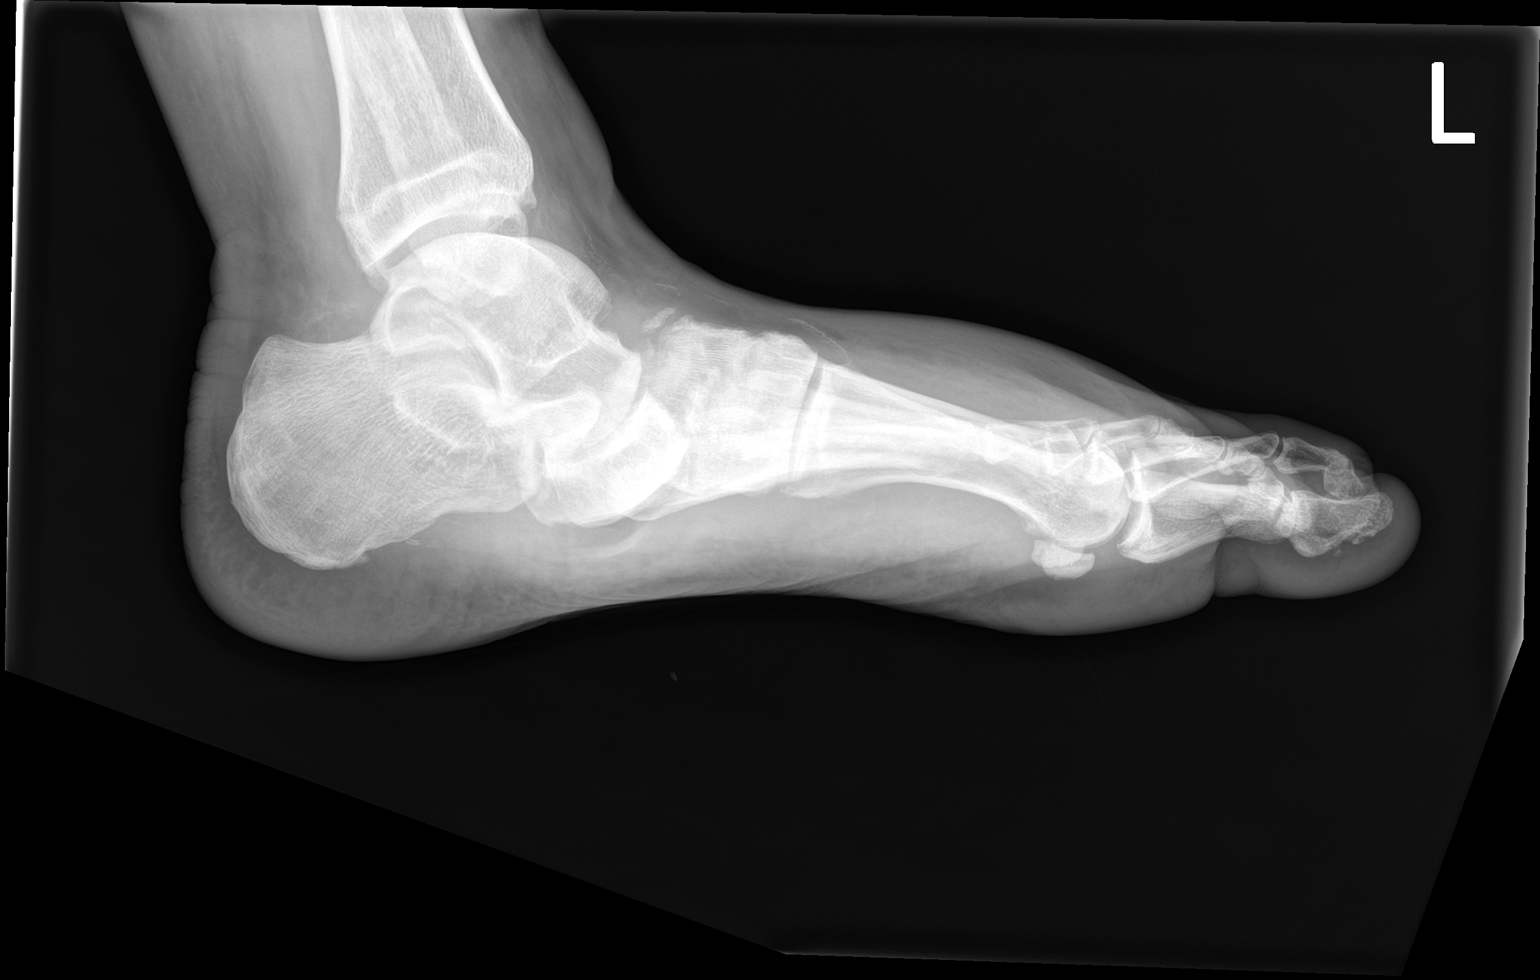

[3 of 3 positions shown; findings below may reference images not displayed]

FINDINGS: There are changes consistent with dislocation of the cuneiforms and
cuboid with respect to the tarsal navicular bone, talus and
calcaneus. The bony prominence medially relates to the navicular
bone secondary to the dislocation. These changes were present on the
prior exam. Soft tissue wound is noted over the navicular bone
consistent with the given clinical history. No discrete bony erosive
changes are identified to suggest osteomyelitis. No acute fracture
is noted.
IMPRESSION: Changes consistent with dislocation between the cuneiforms and
tarsal navicular bone. This creates a bony prominence medially which
corresponds to the soft tissue wound. No discrete bony erosive
changes are noted to suggest osteomyelitis.

## 2024-01-25 ENCOUNTER — Other Ambulatory Visit: Payer: Self-pay | Admitting: Physician Assistant

## 2024-01-25 ENCOUNTER — Telehealth: Payer: Self-pay

## 2024-01-25 MED ORDER — CLOPIDOGREL BISULFATE 75 MG PO TABS: 75.0000 mg | ORAL_TABLET | Freq: Every day | ORAL | 11 refills | Status: AC

## 2024-01-25 NOTE — Telephone Encounter (Signed)
 The pt called regarding a Plavix  refill. PA Ahmed Holster sent the refill RX to [facility] location.
